# Patient Record
Sex: Female | Born: 1955
Health system: Southern US, Community
[De-identification: ages and names within clinical notes are randomized; demographics above are authoritative.]

## PROBLEM LIST (undated history)

## (undated) DIAGNOSIS — M545 Low back pain: Secondary | ICD-10-CM

## (undated) DIAGNOSIS — Z950 Presence of cardiac pacemaker: Secondary | ICD-10-CM

## (undated) DIAGNOSIS — M1990 Unspecified osteoarthritis, unspecified site: Secondary | ICD-10-CM

## (undated) DIAGNOSIS — C9000 Multiple myeloma not having achieved remission: Secondary | ICD-10-CM

## (undated) DIAGNOSIS — E785 Hyperlipidemia, unspecified: Secondary | ICD-10-CM

## (undated) DIAGNOSIS — I509 Heart failure, unspecified: Secondary | ICD-10-CM

## (undated) DIAGNOSIS — F329 Major depressive disorder, single episode, unspecified: Secondary | ICD-10-CM

## (undated) DIAGNOSIS — I10 Essential (primary) hypertension: Secondary | ICD-10-CM

## (undated) DIAGNOSIS — G4733 Obstructive sleep apnea (adult) (pediatric): Secondary | ICD-10-CM

## (undated) DIAGNOSIS — I639 Cerebral infarction, unspecified: Secondary | ICD-10-CM

## (undated) DIAGNOSIS — N390 Urinary tract infection, site not specified: Secondary | ICD-10-CM

## (undated) DIAGNOSIS — G459 Transient cerebral ischemic attack, unspecified: Secondary | ICD-10-CM

## (undated) DIAGNOSIS — D801 Nonfamilial hypogammaglobulinemia: Secondary | ICD-10-CM

## (undated) DIAGNOSIS — F32A Depression, unspecified: Secondary | ICD-10-CM

## (undated) DIAGNOSIS — R778 Other specified abnormalities of plasma proteins: Secondary | ICD-10-CM

## (undated) DIAGNOSIS — R1013 Epigastric pain: Secondary | ICD-10-CM

## (undated) DIAGNOSIS — M549 Dorsalgia, unspecified: Secondary | ICD-10-CM

## (undated) DIAGNOSIS — K56609 Unspecified intestinal obstruction, unspecified as to partial versus complete obstruction: Secondary | ICD-10-CM

## (undated) DIAGNOSIS — R700 Elevated erythrocyte sedimentation rate: Secondary | ICD-10-CM

## (undated) DIAGNOSIS — D472 Monoclonal gammopathy: Secondary | ICD-10-CM

## (undated) DIAGNOSIS — I519 Heart disease, unspecified: Secondary | ICD-10-CM

## (undated) DIAGNOSIS — M791 Myalgia, unspecified site: Secondary | ICD-10-CM

## (undated) DIAGNOSIS — R0602 Shortness of breath: Secondary | ICD-10-CM

## (undated) DIAGNOSIS — A0472 Enterocolitis due to Clostridium difficile, not specified as recurrent: Secondary | ICD-10-CM

## (undated) DIAGNOSIS — K529 Noninfective gastroenteritis and colitis, unspecified: Secondary | ICD-10-CM

## (undated) DIAGNOSIS — K219 Gastro-esophageal reflux disease without esophagitis: Secondary | ICD-10-CM

## (undated) HISTORY — DX: Other specified abnormalities of plasma proteins: R77.8

## (undated) HISTORY — DX: Low back pain: M54.5

## (undated) HISTORY — DX: Essential (primary) hypertension: I10

## (undated) HISTORY — PX: CHOLECYSTECTOMY: SHX55

## (undated) HISTORY — DX: Gastro-esophageal reflux disease without esophagitis: K21.9

## (undated) HISTORY — DX: Major depressive disorder, single episode, unspecified: F32.9

## (undated) HISTORY — DX: Obstructive sleep apnea (adult) (pediatric): G47.33

## (undated) HISTORY — DX: Myalgia, unspecified site: M79.10

## (undated) HISTORY — DX: Multiple myeloma not having achieved remission: C90.00

## (undated) HISTORY — DX: Heart failure, unspecified: I50.9

## (undated) HISTORY — DX: Shortness of breath: R06.02

## (undated) HISTORY — DX: Dorsalgia, unspecified: M54.9

## (undated) HISTORY — DX: Elevated erythrocyte sedimentation rate: R70.0

## (undated) HISTORY — DX: Cerebral infarction, unspecified: I63.9

## (undated) HISTORY — DX: Nonfamilial hypogammaglobulinemia: D80.1

## (undated) HISTORY — DX: Urinary tract infection, site not specified: N39.0

## (undated) HISTORY — DX: Depression, unspecified: F32.A

## (undated) HISTORY — PX: TUBAL LIGATION: SHX77

## (undated) HISTORY — PX: TONSILLECTOMY: SUR1361

## (undated) HISTORY — PX: KNEE SURGERY: SHX244

## (undated) HISTORY — DX: Unspecified osteoarthritis, unspecified site: M19.90

## (undated) HISTORY — PX: ABDOMINAL HYSTERECTOMY: SHX81

## (undated) HISTORY — DX: Hyperlipidemia, unspecified: E78.5

## (undated) HISTORY — DX: Noninfective gastroenteritis and colitis, unspecified: K52.9

## (undated) HISTORY — DX: Heart disease, unspecified: I51.9

## (undated) HISTORY — DX: Epigastric pain: R10.13

## (undated) HISTORY — DX: Monoclonal gammopathy: D47.2

## (undated) HISTORY — DX: Unspecified intestinal obstruction, unspecified as to partial versus complete obstruction: K56.609

---

## 2005-01-02 ENCOUNTER — Ambulatory Visit (HOSPITAL_COMMUNITY): Admission: RE | Admit: 2005-01-02 | Discharge: 2005-01-02 | Payer: Self-pay | Admitting: Family Medicine

## 2005-03-11 ENCOUNTER — Emergency Department (HOSPITAL_COMMUNITY): Admission: EM | Admit: 2005-03-11 | Discharge: 2005-03-11 | Payer: Self-pay | Admitting: Emergency Medicine

## 2005-07-03 ENCOUNTER — Ambulatory Visit (HOSPITAL_COMMUNITY): Admission: RE | Admit: 2005-07-03 | Discharge: 2005-07-03 | Payer: Self-pay | Admitting: Family Medicine

## 2005-07-05 ENCOUNTER — Emergency Department (HOSPITAL_COMMUNITY): Admission: EM | Admit: 2005-07-05 | Discharge: 2005-07-05 | Payer: Self-pay | Admitting: Emergency Medicine

## 2005-07-09 ENCOUNTER — Ambulatory Visit (HOSPITAL_COMMUNITY): Admission: RE | Admit: 2005-07-09 | Discharge: 2005-07-10 | Payer: Self-pay | Admitting: Specialist

## 2006-03-29 ENCOUNTER — Encounter: Admission: RE | Admit: 2006-03-29 | Discharge: 2006-03-29 | Payer: Self-pay | Admitting: Gastroenterology

## 2006-09-09 ENCOUNTER — Ambulatory Visit (HOSPITAL_COMMUNITY): Admission: RE | Admit: 2006-09-09 | Discharge: 2006-09-10 | Payer: Self-pay | Admitting: General Surgery

## 2006-09-09 ENCOUNTER — Encounter (INDEPENDENT_AMBULATORY_CARE_PROVIDER_SITE_OTHER): Payer: Self-pay | Admitting: Specialist

## 2009-10-25 LAB — HM COLONOSCOPY

## 2010-06-17 ENCOUNTER — Emergency Department (HOSPITAL_BASED_OUTPATIENT_CLINIC_OR_DEPARTMENT_OTHER): Admission: EM | Admit: 2010-06-17 | Discharge: 2010-06-17 | Payer: Self-pay | Admitting: Emergency Medicine

## 2010-11-28 ENCOUNTER — Emergency Department (HOSPITAL_BASED_OUTPATIENT_CLINIC_OR_DEPARTMENT_OTHER)
Admission: EM | Admit: 2010-11-28 | Discharge: 2010-11-29 | Payer: Self-pay | Source: Home / Self Care | Admitting: Emergency Medicine

## 2010-12-01 LAB — CBC
HCT: 37.9 % (ref 36.0–46.0)
Hemoglobin: 12.9 g/dL (ref 12.0–15.0)
MCH: 29 pg (ref 26.0–34.0)
MCHC: 34 g/dL (ref 30.0–36.0)
MCV: 85.2 fL (ref 78.0–100.0)
Platelets: 185 K/uL (ref 150–400)
RBC: 4.45 MIL/uL (ref 3.87–5.11)
RDW: 12.8 % (ref 11.5–15.5)
WBC: 4.6 K/uL (ref 4.0–10.5)

## 2010-12-01 LAB — COMPREHENSIVE METABOLIC PANEL
CO2: 26 mEq/L (ref 19–32)
Calcium: 9.4 mg/dL (ref 8.4–10.5)
Creatinine, Ser: 0.6 mg/dL (ref 0.4–1.2)
GFR calc non Af Amer: >60 mL/min (ref >60)
Glucose, Bld: 139 mg/dL — ABNORMAL HIGH (ref 70–99)

## 2010-12-01 LAB — COMPREHENSIVE METABOLIC PANEL WITH GFR
ALT: 16 U/L (ref 0–35)
AST: 20 U/L (ref 0–37)
Albumin: 4.1 g/dL (ref 3.5–5.2)
Alkaline Phosphatase: 62 U/L (ref 39–117)
BUN: 17 mg/dL (ref 6–23)
Chloride: 109 meq/L (ref 96–112)
GFR calc Af Amer: >60 mL/min (ref >60)
Potassium: 3.8 meq/L (ref 3.5–5.1)
Sodium: 148 meq/L — ABNORMAL HIGH (ref 135–145)
Total Bilirubin: 0.6 mg/dL (ref 0.3–1.2)
Total Protein: 8 g/dL (ref 6.0–8.3)

## 2010-12-02 LAB — POCT CARDIAC MARKERS
CKMB, poc: 2.5 ng/mL (ref 1.0–8.0)
Myoglobin, poc: 59.9 ng/mL (ref 12–200)
Troponin i, poc: <0.05 ng/mL (ref 0.00–0.09)

## 2010-12-13 ENCOUNTER — Emergency Department (HOSPITAL_BASED_OUTPATIENT_CLINIC_OR_DEPARTMENT_OTHER)
Admission: EM | Admit: 2010-12-13 | Discharge: 2010-12-13 | Disposition: A | Discharge status: Home / Self Care | Payer: BC Managed Care – PPO | Attending: Emergency Medicine | Admitting: Emergency Medicine

## 2010-12-13 ENCOUNTER — Emergency Department (INDEPENDENT_AMBULATORY_CARE_PROVIDER_SITE_OTHER): Payer: BC Managed Care – PPO

## 2010-12-13 DIAGNOSIS — M25569 Pain in unspecified knee: Secondary | ICD-10-CM

## 2010-12-13 DIAGNOSIS — M25469 Effusion, unspecified knee: Secondary | ICD-10-CM

## 2010-12-13 DIAGNOSIS — I10 Essential (primary) hypertension: Secondary | ICD-10-CM | POA: Insufficient documentation

## 2010-12-13 DIAGNOSIS — Z79899 Other long term (current) drug therapy: Secondary | ICD-10-CM | POA: Insufficient documentation

## 2010-12-13 DIAGNOSIS — W19XXXA Unspecified fall, initial encounter: Secondary | ICD-10-CM

## 2011-01-23 LAB — CBC
HCT: 37.7 % (ref 36.0–46.0)
Hemoglobin: 12.4 g/dL (ref 12.0–15.0)
MCH: 29.7 pg (ref 26.0–34.0)
MCHC: 33 g/dL (ref 30.0–36.0)
MCV: 90 fL (ref 78.0–100.0)
Platelets: 153 10*3/uL (ref 150–400)
RBC: 4.19 MIL/uL (ref 3.87–5.11)
RDW: 12.4 % (ref 11.5–15.5)
WBC: 5 10*3/uL (ref 4.0–10.5)

## 2011-01-23 LAB — POCT CARDIAC MARKERS
CKMB, poc: 1.3 ng/mL (ref 1.0–8.0)
Myoglobin, poc: 61.1 ng/mL (ref 12–200)
Troponin i, poc: <0.05 ng/mL (ref 0.00–0.09)

## 2011-01-23 LAB — BASIC METABOLIC PANEL WITH GFR
CO2: 29 meq/L (ref 19–32)
Chloride: 107 meq/L (ref 96–112)
GFR calc Af Amer: >60 mL/min (ref >60)
Glucose, Bld: 93 mg/dL (ref 70–99)
Potassium: 3.7 meq/L (ref 3.5–5.1)
Sodium: 144 meq/L (ref 135–145)

## 2011-01-23 LAB — DIFFERENTIAL
Basophils Absolute: 0.1 10*3/uL (ref 0.0–0.1)
Basophils Relative: 1 % (ref 0–1)
Eosinophils Absolute: 0.1 K/uL (ref 0.0–0.7)
Eosinophils Relative: 3 % (ref 0–5)
Lymphocytes Relative: 38 % (ref 12–46)
Lymphs Abs: 1.9 K/uL (ref 0.7–4.0)
Monocytes Absolute: 0.4 K/uL (ref 0.1–1.0)
Monocytes Relative: 8 % (ref 3–12)
Neutro Abs: 2.5 K/uL (ref 1.7–7.7)
Neutrophils Relative %: 50 % (ref 43–77)

## 2011-01-23 LAB — BASIC METABOLIC PANEL
BUN: 24 mg/dL — ABNORMAL HIGH (ref 6–23)
Calcium: 9.2 mg/dL (ref 8.4–10.5)
Creatinine, Ser: 0.7 mg/dL (ref 0.4–1.2)
GFR calc non Af Amer: >60 mL/min (ref >60)

## 2011-03-27 NOTE — Op Note (Signed)
Tami [name], Tami [name]               ACCOUNT NO.:  [id number]   MEDICAL RECORD NO.:  [id number]          PATIENT TYPE:  OIB   LOCATION:  5703                         FACILITY:  [facility]   PHYSICIAN:  Cherylynn Ridges, M.D.    DATE OF BIRTH:  [birth year]   DATE OF PROCEDURE:  09/10/2006  DATE OF DISCHARGE:                                 OPERATIVE REPORT   PREOPERATIVE DIAGNOSIS:  Biliary dyskinesia with symptoms.   POSTOPERATIVE DIAGNOSIS:  Biliary dyskinesia with symptoms with abdominal  adhesions.   PROCEDURE:  1. Laparoscopic cholecystectomy with cholangiogram.  2. Laparoscopic enterolysis.   SURGEON:  Cherylynn Ridges, M.D.   ASSISTANT:  Anselm Pancoast. Zachery Dakins, M.D.   ANESTHESIA:  General endotracheal.   ESTIMATED BLOOD LOSS:  Less than 30 mL.   COMPLICATIONS:  None.   CONDITION:  Stable.   FINDINGS:  The patient had significant abdominal adhesions in the  periumbilical area and in the lower abdomen from her previous hysterectomy.  Her gallbladder cholangiogram showed good flow to the duodenum.  No filling  defects.  No ductal dilatation.  Good proximal flow and what appeared to be  an accessory right hepatic duct coming off of the proximal cystic duct.   INDICATIONS FOR OPERATION:  The patient is a 55 year old with symptomatic  biliary dyskinesia who comes in for an elective laparoscopic  cholecystectomy.   OPERATION:  The patient was taken to the operating room and placed on the  table in the supine position.  After an adequate endotracheal anesthetic was  administered, she was prepped and draped in the usual sterile manner  exposing the midline in the right upper quadrant.   A supraumbilical curvilinear incision was made using an 11 blade and taken  down to the midline.  It was at that level that we palpated the small  umbilical hernia which we used as our access point for the perineal cavity.  We used Kelly clamps to bluntly dissect down to the peritoneal cavity and we  were able to easily do so.  We then passed a pursestring suture of 0 Vicryl  on a UR-16 around the fascial at the hernia and then passed the Hasson  cannula into the peritoneal cavity.  It easily went in and we were able to  insufflate carbon dioxide gas up to a maximum pressure of 15 mmHg.  We were  then able to pass a laparoscope easily into the peritoneal cavity; however,  it showed that the patient had a very short torso and that her liver edge  almost extended down to the anterior-superior iliac spine.   The level of the 5-mm cannula on the right side and the xiphoid cannula was  significantly lower than usual with the lateral-most 5-mm cannula being  almost directly above the anterior-superior iliac spine.  All three cannulas  were placed under direct vision into the peritoneal cavity.  Once they were  in place, we spent a significant amount of time taking down some of the  periumbilical and lower abdominal wall adhesions of omentum, which  significantly impeded the freedom of movement of  the midline trocar.  Once  this was done, we placed the patient in reverse Trendelenburg, placed the  left side down and then used graspers to adequately retract the gallbladder  for further dissection.   The patient was noted to have a very long external or anterior cystic artery  which we were able to localize and then clip proximally x3 and transect.  We  then dissected out the peritoneum, mobilizing the triangle of Calot and  hepatic duodenal triangle and isolated the cystic duct with an adequate  window surrounding.  Once this was done, we placed a clip along the  gallbladder side and had to make a cholecystocolotomy through which a  cholangiogram catheter, Cook type, was passed and then clipped in place.  With the cholangiograms in place, we were able to do a cholangiogram which  showed good flow into the duodenum and accessory right hepatic duct coming  off the cystic duct distal to where  the catheter was inserted.  There was  good proximal flow also.   Once this was done and there was no evidence of any intraductal stones,  removed the catheter and then clipped it proximal to the point of the  accessory duct takeoff.  We then dissected out the gallbladder from its bed  after transecting the cystic duct with minimal difficulty and brought it out  from the supraumbilical fascial site with minimal difficulty.  We had to  place an additional stitch in addition to the pursestring at the umbilical  site to close off the fascia.   Once this was done we inspected the bed where there was evidence of no  bleeding.  We aspirated fluid and gas from around the subhepatic space, the  subdiaphragmatic space on the right side.  Once we had aspirated out all  gas, we removed all cannulas.  We used 0.25% Marcaine with epinephrine at  all sites.  We closed the supraumbilical and subxiphoid incisions using  running subcuticular stitch of 4-0 Vicryl.  We placed Dermabond to close the  lateral most cannula sites.  Sterile dressings were applied.  All needle  counts, sponge counts, and instrument counts were correct.      Cherylynn Ridges, M.D.  Electronically Signed     JOW/MEDQ  D:  09/09/2006  T:  09/10/2006  Job:  [id number]

## 2011-03-27 NOTE — Discharge Summary (Signed)
Tami [name], Tami [name]               ACCOUNT NO.:  [id number]   MEDICAL RECORD NO.:  [id number]          PATIENT TYPE:  OIB   LOCATION:  5511                         FACILITY:  [facility]   PHYSICIAN:  Sanjeev K. Deveshwar, M.D.DATE OF BIRTH:  02-03-1956   DATE OF ADMISSION:  07/09/2005  DATE OF DISCHARGE:                                 DISCHARGE SUMMARY   This was a 23-hour observation.   CHIEF COMPLAINT:  The patient presents for cerebral angiogram.   HISTORY OF PRESENT ILLNESS:  This is a 55 year old female followed by Dr.  Laurann Montana and evaluated by Dr. Neale Burly for headaches. 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 The patient was referred to Dr. Corliss Skains  for cerebral angiogram to rule out an aneurysm and further evaluate her  headaches.   PAST MEDICAL HISTORY:  1.  Hypertension.  2.  Gastroesophageal reflux disease.  3.  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   PAST MEDICAL HISTORY:  She is status post hysterectomy and right knee  surgery.   ALLERGIES:  The patient is intolerant to CODEINE.   MEDICATIONS ON ADMISSION:  1.  Phenergan 25 mg p.r.n.  2.  Lotrel 10/20 one q.a.m.  3.  Flexeril 10 mg t.i.d. p.r.n.  4.  Atenolol 50 mg daily.  5.  Topamax 25 mg 3 tablets at bedtime.  6.  Prednisone as directed.   SOCIAL HISTORY:  The patient is single. She has three children. She lives  alone in [place]. She has never used tobacco. She does not use alcohol.  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   FAMILY HISTORY:  The patient's mother is alive at age 61. She has  hypertension and diabetes. Her father is alive at age 60. He has a history  of a gastric ulcer.   HOSPITAL COURSE:  As noted, this patient was referred for a cerebral  angiogram. The cerebral angiogram was performed on the day of admission.  This showed no evidence to suggest an aneurysm or an AV malformation. 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 Dr. Corliss Skains did not feel any further evaluation was  indicated at this time.   The patient was to be discharged that evening. However, she had a headache,  and decision was made to keep her in the hospital for a 23-hour observation.  She was discharged the following morning in improved and stable condition.   LABORATORY DATA:  A chemistry profile on the day of discharge revealed BUN  23, creatinine 0.8, potassium 3.6, glucose 105. The remainder was within  normal limits. A CBC was within normal limits. A urinalysis was performed  that showed 3 to 6 WBCs per high powered field, 3 to 6 RBCs per high powered  field, rare bacteria. There was a large amount of blood. Nitrite was  negative. Leukocytes revealed a small amount.   DISCHARGE INSTRUCTIONS:  The patient was told to her limit her activity for  the  next two days. She was told she could return to work next Wednesday as  scheduled. She was not to lift more than 10 pounds until she returned to  work. She was not to take any tub baths for a week. She was told to  gradually increase her activity. She was told to take Tylenol as needed for  pain and continue her regular home medications as listed above.   The patient was told to followup with Dr. Laurann Montana regarding the blood  in her urine. She was told to call Dr. Cliffton Asters for an appointment. She was  told to call Dr. Corliss Skains if she had any problems with her groin area.   PROBLEM LIST AT THE TIME OF DISCHARGE:  1.  History of headaches.  2.  Recent MRA with a question of a cerebral aneurysm.  3.  Status post cerebral angiogram showing no aneurysm or arteriovenous      malformation.  4.  History of hypertension.  5.  History of gastroesophageal reflux disease.  6.  Arthritis.  7.  Questionable history of anxiety and depression.  8.  Hematuria. Further followup recommended.       [name], P.A.    ______________________________  Grandville Silos. Corliss Skains, M.D.    DR/MEDQ  D:  07/10/2005  T:  07/10/2005  Job:  [id number]   cc:   Stacie Acres. Cliffton Asters, M.D.  Fax: [phone]   Santiago Glad  [address], [address]  [place]  [place] [address]  Fax: [phone]

## 2011-03-27 NOTE — H&P (Signed)
Tami [name], Tami [name]               ACCOUNT NO.:  [id number]   MEDICAL RECORD NO.:  [id number]          PATIENT TYPE:  OIB   LOCATION:                               FACILITY:  [facility]   PHYSICIAN:  Sanjeev K. Deveshwar, M.D.DATE OF BIRTH:  08/30/1956   DATE OF ADMISSION:  DATE OF DISCHARGE:                                HISTORY & PHYSICAL   CHIEF COMPLAINT:  The patient is here for cerebral angiogram today.   HISTORY OF PRESENT ILLNESS:  This is a 55 year old female followed by Dr.  Laurann Montana, recently referred to Dr. Neale Burly for evaluation of headaches.  The patient apparently has had headaches since the 1970s. The headaches have  become worse over the past three weeks. 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 There was also a question of significant stenosis of the left  anterior cerebral artery proximally. The patient has been referred for a  cerebral angiogram and presents today for that study.   PAST MEDICAL HISTORY:  1.  Hypertension.  2.  Gastroesophageal reflux disease.  3.  Arthritis in her knees.   She denies diabetes. She denies any type of cardiac problems.   PAST SURGICAL HISTORY:  Significant for hysterectomy and right knee surgery.   ALLERGIES:  CODEINE causes nausea and vomiting. She does not know whether  she has ever had contrast dye.   CURRENT MEDICATIONS:  1.  Phenergan 25 mg 1 q.4h. p.r.n.  2.  Lotrel 10/20 one q.a.m.  3.  Flexeril 10 mg t.i.d. p.r.n.  4.  Atenolol 50 mg daily.  5.  Topamax 25 mg 3 at bedtime.  6.  Prednisone to be taken as directed. The patient tells me she is taking      10 mg 4 times daily.   SOCIAL HISTORY:  The patient is single. She has three children. She lives  alone in [place]. She has never used tobacco. She does not use alcohol.  She works as a Pension scheme manager at [employer].   FAMILY HISTORY:  The patient's mother is living at age 14.  She has  hypertension and diabetes. Her father is alive at age 93. He has a history  of gastric cancer.   REVIEW OF SYSTEMS:  Positive for recent headaches as mentioned. She also  reports some blurred vision. She has had episodes where she feels hot and  diaphoretic. However, her temperature is normal during these times by her  report. She has had a recent nosebleed which she attributes to sinus  trouble. She has a history of anxiety and depression. She has arthritis in  her knees. She has had intermittent numbness of both lower extremities which  occurs mainly at night. She sometimes has difficulty walking and difficulty  with her coordination.   PHYSICAL EXAMINATION:  GENERAL:  Reveals a 55 year old African-American  female with a flat affect but no acute distress. Vital signs are pending.  Airway is rated at a 1 or a 2. Her ASA scale is a 1 or a 2.  HEENT:  Unremarkable. The patient has exophthalmus.  NECK:  Reveals no bruits. No jugular venous distention.  HEART:  Reveals regular rate and rhythm with a question of a soft systolic  murmur.  LUNGS:  Clear.  ABDOMEN:  Soft, nontender.  EXTREMITIES:  Reveal pulses to be intact without edema.  SKIN:  Warm and dry.  EXTREMITIES:  Mental status:  The patient is alert and oriented. She follows  commands. Cranial nerves II-XII are grossly intact. Sensation is intact to  light touch. Motor strength is 5/5 throughout. Cerebellar testing is intact  although performed somewhat slowly.   IMPRESSION:  1.  Recent headaches.  2.  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  3.  History of hypertension.  4.  Gastroesophageal reflux disease.  5.  History of arthritis.  6.  History of anxiety and depression.   PLAN:  The patient will undergo cerebral angiograms today to further  evaluate the abnormal MRA findings as well as her  headaches.      [name], P.A.    ______________________________  Grandville Silos. Corliss Skains, M.D.    DR/MEDQ  D:  07/09/2005  T:  07/09/2005  Job:  [id number]   cc:   Stacie Acres. Cliffton Asters, M.D.  Fax: [phone]   Santiago Glad  [address]  [place]  [place] [address]  Fax: [phone]

## 2014-08-09 HISTORY — PX: PACEMAKER INSERTION: SHX728

## 2014-11-09 DIAGNOSIS — K56609 Unspecified intestinal obstruction, unspecified as to partial versus complete obstruction: Secondary | ICD-10-CM

## 2014-11-09 HISTORY — DX: Unspecified intestinal obstruction, unspecified as to partial versus complete obstruction: K56.609

## 2015-02-21 ENCOUNTER — Encounter: Payer: Self-pay | Admitting: Family Medicine

## 2015-02-21 ENCOUNTER — Ambulatory Visit (INDEPENDENT_AMBULATORY_CARE_PROVIDER_SITE_OTHER): Payer: Managed Care, Other (non HMO) | Admitting: Family Medicine

## 2015-02-21 VITALS — BP 162/108 | HR 91 | Temp 97.7°F | Resp 18 | Ht 66.0 in | Wt 161.0 lb

## 2015-02-21 DIAGNOSIS — F329 Major depressive disorder, single episode, unspecified: Secondary | ICD-10-CM

## 2015-02-21 DIAGNOSIS — K5660 Unspecified intestinal obstruction: Secondary | ICD-10-CM

## 2015-02-21 DIAGNOSIS — I509 Heart failure, unspecified: Secondary | ICD-10-CM

## 2015-02-21 DIAGNOSIS — F32A Depression, unspecified: Secondary | ICD-10-CM

## 2015-02-21 DIAGNOSIS — R1013 Epigastric pain: Secondary | ICD-10-CM

## 2015-02-21 DIAGNOSIS — K219 Gastro-esophageal reflux disease without esophagitis: Secondary | ICD-10-CM | POA: Diagnosis not present

## 2015-02-21 DIAGNOSIS — I10 Essential (primary) hypertension: Secondary | ICD-10-CM

## 2015-02-21 DIAGNOSIS — E785 Hyperlipidemia, unspecified: Secondary | ICD-10-CM

## 2015-02-21 LAB — CBC
HEMATOCRIT: 36.7 % (ref 36.0–46.0)
HEMOGLOBIN: 12.3 g/dL (ref 12.0–15.0)
MCHC: 33.4 g/dL (ref 30.0–36.0)
MCV: 88 fl (ref 78.0–100.0)
PLATELETS: 166 10*3/uL (ref 150.0–400.0)
RBC: 4.17 Mil/uL (ref 3.87–5.11)
RDW: 14.2 % (ref 11.5–15.5)
WBC: 4 10*3/uL (ref 4.0–10.5)

## 2015-02-21 LAB — H. PYLORI ANTIBODY, IGG: H Pylori IgG: NEGATIVE

## 2015-02-21 LAB — COMPREHENSIVE METABOLIC PANEL
ALK PHOS: 44 U/L (ref 39–117)
ALT: 16 U/L (ref 0–35)
AST: 18 U/L (ref 0–37)
Albumin: 3.8 g/dL (ref 3.5–5.2)
BILIRUBIN TOTAL: 0.6 mg/dL (ref 0.2–1.2)
BUN: 19 mg/dL (ref 6–23)
CO2: 27 mEq/L (ref 19–32)
Calcium: 9.5 mg/dL (ref 8.4–10.5)
Chloride: 106 mEq/L (ref 96–112)
Creatinine, Ser: 0.67 mg/dL (ref 0.40–1.20)
GFR: 115.18 mL/min (ref >60.00)
Glucose, Bld: 81 mg/dL (ref 70–99)
Potassium: 3.4 mEq/L — ABNORMAL LOW (ref 3.5–5.1)
Sodium: 140 mEq/L (ref 135–145)
TOTAL PROTEIN: 8.1 g/dL (ref 6.0–8.3)

## 2015-02-21 LAB — TSH: TSH: 0.77 u[IU]/mL (ref 0.35–4.50)

## 2015-02-21 MED ORDER — ESCITALOPRAM OXALATE 20 MG PO TABS: 20.0000 mg | ORAL_TABLET | Freq: Every day | ORAL | Status: DC

## 2015-02-21 MED ORDER — CARVEDILOL 12.5 MG PO TABS: 12.5000 mg | ORAL_TABLET | Freq: Two times a day (BID) | ORAL | Status: DC

## 2015-02-21 NOTE — Patient Instructions (Addendum)
[facility] on [place] previous PMD  Rel of [facility] Dr Ola Spurr cardiology, works with [place] [facility] at [employer], on [place] of [facility] Earnie Larsson GYN [facility] in [place]   Consider a probiotic daily such as Digestive Advantage or [place] for Adults A healthy lifestyle and preventive care can promote health and wellness. Preventive health guidelines for women include the following key practices.  A routine yearly physical is a good way to check with your health care provider about your health and preventive screening. It is a chance to share any concerns and updates on your health and to receive a thorough exam.  Visit your dentist for a routine exam and preventive care every 6 months. Brush your teeth twice a day and floss once a day. Good oral hygiene prevents tooth decay and gum disease.  The frequency of eye exams is based on your age, health, family medical history, use of contact lenses, and other factors. Follow your health care provider's recommendations for frequency of eye exams.  Eat a healthy diet. Foods like vegetables, fruits, whole grains, low-fat dairy products, and lean protein foods contain the nutrients you need without too many calories. Decrease your intake of foods high in solid fats, added sugars, and salt. Eat the right amount of calories for you.Get information about a proper diet from your health care provider, if necessary.  Regular physical exercise is one of the most important things you can do for your health. Most adults should get at least 150 minutes of moderate-intensity exercise (any activity that increases your heart rate and causes you to sweat) each week. In addition, most adults need muscle-strengthening exercises on 2 or more days a week.  Maintain a healthy weight. The body mass index (BMI) is a screening tool to identify possible weight problems. It provides an  estimate of body fat based on height and weight. Your health care provider can find your BMI and can help you achieve or maintain a healthy weight.For adults 20 years and older:  A BMI below 18.5 is considered underweight.  A BMI of 18.5 to 24.9 is normal.  A BMI of 25 to 29.9 is considered overweight.  A BMI of 30 and above is considered obese.  Maintain normal blood lipids and cholesterol levels by exercising and minimizing your intake of saturated fat. Eat a balanced diet with plenty of fruit and vegetables. Blood tests for lipids and cholesterol should begin at age 47 and be repeated every 5 years. If your lipid or cholesterol levels are high, you are over 50, or you are at high risk for heart disease, you may need your cholesterol levels checked more frequently.Ongoing high lipid and cholesterol levels should be treated with medicines if diet and exercise are not working.  If you smoke, find out from your health care provider how to quit. If you do not use tobacco, do not start.  Lung cancer screening is recommended for adults aged 31-80 years who are at high risk for developing lung cancer because of a history of smoking. A yearly low-dose CT scan of the lungs is recommended for people who have at least a 30-pack-year history of smoking and are a current smoker or have quit within the past 15 years. A pack year of smoking is smoking an average of 1 pack of cigarettes a day for 1 year (for example: 1 pack a day  for 30 years or 2 packs a day for 15 years). Yearly screening should continue until the smoker has stopped smoking for at least 15 years. Yearly screening should be stopped for people who develop a health problem that would prevent them from having lung cancer treatment.  If you are pregnant, do not drink alcohol. If you are breastfeeding, be very cautious about drinking alcohol. If you are not pregnant and choose to drink alcohol, do not have more than 1 drink per day. One drink is  considered to be 12 ounces (355 mL) of beer, 5 ounces (148 mL) of wine, or 1.5 ounces (44 mL) of liquor.  Avoid use of street drugs. Do not share needles with anyone. Ask for help if you need support or instructions about stopping the use of drugs.  High blood pressure causes heart disease and increases the risk of stroke. Your blood pressure should be checked at least every 1 to 2 years. Ongoing high blood pressure should be treated with medicines if weight loss and exercise do not work.  If you are 22-60 years old, ask your health care provider if you should take aspirin to prevent strokes.  Diabetes screening involves taking a blood sample to check your fasting blood sugar level. This should be done once every 3 years, after age 72, if you are within normal weight and without risk factors for diabetes. Testing should be considered at a younger age or be carried out more frequently if you are overweight and have at least 1 risk factor for diabetes.  Breast cancer screening is essential preventive care for women. You should practice "breast self-awareness." This means understanding the normal appearance and feel of your breasts and may include breast self-examination. Any changes detected, no matter how small, should be reported to a health care provider. Women in their 6s and 30s should have a clinical breast exam (CBE) by a health care provider as part of a regular health exam every 1 to 3 years. After age 39, women should have a CBE every year. Starting at age 59, women should consider having a mammogram (breast X-ray test) every year. Women who have a family history of breast cancer should talk to their health care provider about genetic screening. Women at a high risk of breast cancer should talk to their health care providers about having an MRI and a mammogram every year.  Breast cancer gene (BRCA)-related cancer risk assessment is recommended for women who have family members with BRCA-related  cancers. BRCA-related cancers include breast, ovarian, tubal, and peritoneal cancers. Having family members with these cancers may be associated with an increased risk for harmful changes (mutations) in the breast cancer genes BRCA1 and BRCA2. Results of the assessment will determine the need for genetic counseling and BRCA1 and BRCA2 testing.  Routine pelvic exams to screen for cancer are no longer recommended for nonpregnant women who are considered low risk for cancer of the pelvic organs (ovaries, uterus, and vagina) and who do not have symptoms. Ask your health care provider if a screening pelvic exam is right for you.  If you have had past treatment for cervical cancer or a condition that could lead to cancer, you need Pap tests and screening for cancer for at least 20 years after your treatment. If Pap tests have been discontinued, your risk factors (such as having a new sexual partner) need to be reassessed to determine if screening should be resumed. Some women have medical problems that increase the chance  of getting cervical cancer. In these cases, your health care provider may recommend more frequent screening and Pap tests.  The HPV test is an additional test that may be used for cervical cancer screening. The HPV test looks for the virus that can cause the cell changes on the cervix. The cells collected during the Pap test can be tested for HPV. The HPV test could be used to screen women aged 63 years and older, and should be used in women of any age who have unclear Pap test results. After the age of 38, women should have HPV testing at the same frequency as a Pap test.  Colorectal cancer can be detected and often prevented. Most routine colorectal cancer screening begins at the age of 17 years and continues through age 34 years. However, your health care provider may recommend screening at an earlier age if you have risk factors for colon cancer. On a yearly basis, your health care provider  may provide home test kits to check for hidden blood in the stool. Use of a small camera at the end of a tube, to directly examine the colon (sigmoidoscopy or colonoscopy), can detect the earliest forms of colorectal cancer. Talk to your health care provider about this at age 58, when routine screening begins. Direct exam of the colon should be repeated every 5-10 years through age 68 years, unless early forms of pre-cancerous polyps or small growths are found.  People who are at an increased risk for hepatitis B should be screened for this virus. You are considered at high risk for hepatitis B if:  You were born in a country where hepatitis B occurs often. Talk with your health care provider about which countries are considered high risk.  Your parents were born in a high-risk country and you have not received a shot to protect against hepatitis B (hepatitis B vaccine).  You have HIV or AIDS.  You use needles to inject street drugs.  You live with, or have sex with, someone who has hepatitis B.  You get hemodialysis treatment.  You take certain medicines for conditions like cancer, organ transplantation, and autoimmune conditions.  Hepatitis C blood testing is recommended for all people born from [birth year] through 1965 and any individual with known risks for hepatitis C.  Practice safe sex. Use condoms and avoid high-risk sexual practices to reduce the spread of sexually transmitted infections (STIs). STIs include gonorrhea, chlamydia, syphilis, trichomonas, herpes, HPV, and human immunodeficiency virus (HIV). Herpes, HIV, and HPV are viral illnesses that have no cure. They can result in disability, cancer, and death.  You should be screened for sexually transmitted illnesses (STIs) including gonorrhea and chlamydia if:  You are sexually active and are younger than 24 years.  You are older than 24 years and your health care provider tells you that you are at risk for this type of  infection.  Your sexual activity has changed since you were last screened and you are at an increased risk for chlamydia or gonorrhea. Ask your health care provider if you are at risk.  If you are at risk of being infected with HIV, it is recommended that you take a prescription medicine daily to prevent HIV infection. This is called preexposure prophylaxis (PrEP). You are considered at risk if:  You are a heterosexual woman, are sexually active, and are at increased risk for HIV infection.  You take drugs by injection.  You are sexually active with a partner who has HIV.  Talk with your health care provider about whether you are at high risk of being infected with HIV. If you choose to begin PrEP, you should first be tested for HIV. You should then be tested every 3 months for as long as you are taking PrEP.  Osteoporosis is a disease in which the bones lose minerals and strength with aging. This can result in serious bone fractures or breaks. The risk of osteoporosis can be identified using a bone density scan. Women ages 61 years and over and women at risk for fractures or osteoporosis should discuss screening with their health care providers. Ask your health care provider whether you should take a calcium supplement or vitamin D to reduce the rate of osteoporosis.  Menopause can be associated with physical symptoms and risks. Hormone replacement therapy is available to decrease symptoms and risks. You should talk to your health care provider about whether hormone replacement therapy is right for you.  Use sunscreen. Apply sunscreen liberally and repeatedly throughout the day. You should seek shade when your shadow is shorter than you. Protect yourself by wearing long sleeves, pants, a wide-brimmed hat, and sunglasses year round, whenever you are outdoors.  Once a month, do a whole body skin exam, using a mirror to look at the skin on your back. Tell your health care provider of new moles,  moles that have irregular borders, moles that are larger than a pencil eraser, or moles that have changed in shape or color.  Stay current with required vaccines (immunizations).  Influenza vaccine. All adults should be immunized every year.  Tetanus, diphtheria, and acellular pertussis (Td, Tdap) vaccine. Pregnant women should receive 1 dose of Tdap vaccine during each pregnancy. The dose should be obtained regardless of the length of time since the last dose. Immunization is preferred during the 27th-36th week of gestation. An adult who has not previously received Tdap or who does not know her vaccine status should receive 1 dose of Tdap. This initial dose should be followed by tetanus and diphtheria toxoids (Td) booster doses every 10 years. Adults with an unknown or incomplete history of completing a 3-dose immunization series with Td-containing vaccines should begin or complete a primary immunization series including a Tdap dose. Adults should receive a Td booster every 10 years.  Varicella vaccine. An adult without evidence of immunity to varicella should receive 2 doses or a second dose if she has previously received 1 dose. Pregnant females who do not have evidence of immunity should receive the first dose after pregnancy. This first dose should be obtained before leaving the health care facility. The second dose should be obtained 4-8 weeks after the first dose.  Human papillomavirus (HPV) vaccine. Females aged 13-26 years who have not received the vaccine previously should obtain the 3-dose series. The vaccine is not recommended for use in pregnant females. However, pregnancy testing is not needed before receiving a dose. If a female is found to be pregnant after receiving a dose, no treatment is needed. In that case, the remaining doses should be delayed until after the pregnancy. Immunization is recommended for any person with an immunocompromised condition through the age of 75 years if she  did not get any or all doses earlier. During the 3-dose series, the second dose should be obtained 4-8 weeks after the first dose. The third dose should be obtained 24 weeks after the first dose and 16 weeks after the second dose.  Zoster vaccine. One dose is recommended for adults  aged 68 years or older unless certain conditions are present.  Measles, mumps, and rubella (MMR) vaccine. Adults born before 20 generally are considered immune to measles and mumps. Adults born in [birth year] or later should have 1 or more doses of MMR vaccine unless there is a contraindication to the vaccine or there is laboratory evidence of immunity to each of the three diseases. A routine second dose of MMR vaccine should be obtained at least 28 days after the first dose for students attending postsecondary schools, health care workers, or international travelers. People who received inactivated measles vaccine or an unknown type of measles vaccine during 1963-1967 should receive 2 doses of MMR vaccine. People who received inactivated mumps vaccine or an unknown type of mumps vaccine before 1979 and are at high risk for mumps infection should consider immunization with 2 doses of MMR vaccine. For females of childbearing age, rubella immunity should be determined. If there is no evidence of immunity, females who are not pregnant should be vaccinated. If there is no evidence of immunity, females who are pregnant should delay immunization until after pregnancy. Unvaccinated health care workers born before [birth year] who lack laboratory evidence of measles, mumps, or rubella immunity or laboratory confirmation of disease should consider measles and mumps immunization with 2 doses of MMR vaccine or rubella immunization with 1 dose of MMR vaccine.  Pneumococcal 13-valent conjugate (PCV13) vaccine. When indicated, a person who is uncertain of her immunization history and has no record of immunization should receive the PCV13 vaccine. An adult  aged 59 years or older who has certain medical conditions and has not been previously immunized should receive 1 dose of PCV13 vaccine. This PCV13 should be followed with a dose of pneumococcal polysaccharide (PPSV23) vaccine. The PPSV23 vaccine dose should be obtained at least 8 weeks after the dose of PCV13 vaccine. An adult aged 27 years or older who has certain medical conditions and previously received 1 or more doses of PPSV23 vaccine should receive 1 dose of PCV13. The PCV13 vaccine dose should be obtained 1 or more years after the last PPSV23 vaccine dose.  Pneumococcal polysaccharide (PPSV23) vaccine. When PCV13 is also indicated, PCV13 should be obtained first. All adults aged 60 years and older should be immunized. An adult younger than age 37 years who has certain medical conditions should be immunized. Any person who resides in a nursing home or long-term care facility should be immunized. An adult smoker should be immunized. People with an immunocompromised condition and certain other conditions should receive both PCV13 and PPSV23 vaccines. People with human immunodeficiency virus (HIV) infection should be immunized as soon as possible after diagnosis. Immunization during chemotherapy or radiation therapy should be avoided. Routine use of PPSV23 vaccine is not recommended for American Indians, [place] Natives, or people younger than 65 years unless there are medical conditions that require PPSV23 vaccine. When indicated, people who have unknown immunization and have no record of immunization should receive PPSV23 vaccine. One-time revaccination 5 years after the first dose of PPSV23 is recommended for people aged 19-64 years who have chronic kidney failure, nephrotic syndrome, asplenia, or immunocompromised conditions. People who received 1-2 doses of PPSV23 before age 43 years should receive another dose of PPSV23 vaccine at age 37 years or later if at least 5 years have passed since the previous  dose. Doses of PPSV23 are not needed for people immunized with PPSV23 at or after age 60 years.  Meningococcal vaccine. Adults with asplenia or persistent complement component  deficiencies should receive 2 doses of quadrivalent meningococcal conjugate (MenACWY-D) vaccine. The doses should be obtained at least 2 months apart. Microbiologists working with certain meningococcal bacteria, [employer] recruits, people at risk during an outbreak, and people who travel to or live in countries with a high rate of meningitis should be immunized. A first-year college student up through age 37 years who is living in a residence hall should receive a dose if she did not receive a dose on or after her 16th birthday. Adults who have certain high-risk conditions should receive one or more doses of vaccine.  Hepatitis A vaccine. Adults who wish to be protected from this disease, have certain high-risk conditions, work with hepatitis A-infected animals, work in hepatitis A research labs, or travel to or work in countries with a high rate of hepatitis A should be immunized. Adults who were previously unvaccinated and who anticipate close contact with an international adoptee during the first 60 days after arrival in the [place] from a country with a high rate of hepatitis A should be immunized.  Hepatitis B vaccine. Adults who wish to be protected from this disease, have certain high-risk conditions, may be exposed to blood or other infectious body fluids, are household contacts or sex partners of hepatitis B positive people, are clients or workers in certain care facilities, or travel to or work in countries with a high rate of hepatitis B should be immunized.  Haemophilus influenzae type b (Hib) vaccine. A previously unvaccinated person with asplenia or sickle cell disease or having a scheduled splenectomy should receive 1 dose of Hib vaccine. Regardless of previous immunization, a recipient of a hematopoietic stem cell  transplant should receive a 3-dose series 6-12 months after her successful transplant. Hib vaccine is not recommended for adults with HIV infection. Preventive Services / Frequency Ages 48 to 63 years  Blood pressure check.** / Every 1 to 2 years.  Lipid and cholesterol check.** / Every 5 years beginning at age 3.  Clinical breast exam.** / Every 3 years for women in their 55s and 15s.  BRCA-related cancer risk assessment.** / For women who have family members with a BRCA-related cancer (breast, ovarian, tubal, or peritoneal cancers).  Pap test.** / Every 2 years from ages 1 through 12. Every 3 years starting at age 5 through age 64 or 34 with a history of 3 consecutive normal Pap tests.  HPV screening.** / Every 3 years from ages 20 through ages 56 to 70 with a history of 3 consecutive normal Pap tests.  Hepatitis C blood test.** / For any individual with known risks for hepatitis C.  Skin self-exam. / Monthly.  Influenza vaccine. / Every year.  Tetanus, diphtheria, and acellular pertussis (Tdap, Td) vaccine.** / Consult your health care provider. Pregnant women should receive 1 dose of Tdap vaccine during each pregnancy. 1 dose of Td every 10 years.  Varicella vaccine.** / Consult your health care provider. Pregnant females who do not have evidence of immunity should receive the first dose after pregnancy.  HPV vaccine. / 3 doses over 6 months, if 36 and younger. The vaccine is not recommended for use in pregnant females. However, pregnancy testing is not needed before receiving a dose.  Measles, mumps, rubella (MMR) vaccine.** / You need at least 1 dose of MMR if you were born in 1957 or later. You may also need a 2nd dose. For females of childbearing age, rubella immunity should be determined. If there is no evidence of immunity,  females who are not pregnant should be vaccinated. If there is no evidence of immunity, females who are pregnant should delay immunization until after  pregnancy.  Pneumococcal 13-valent conjugate (PCV13) vaccine.** / Consult your health care provider.  Pneumococcal polysaccharide (PPSV23) vaccine.** / 1 to 2 doses if you smoke cigarettes or if you have certain conditions.  Meningococcal vaccine.** / 1 dose if you are age 1 to 54 years and a Market researcher living in a residence hall, or have one of several medical conditions, you need to get vaccinated against meningococcal disease. You may also need additional booster doses.  Hepatitis A vaccine.** / Consult your health care provider.  Hepatitis B vaccine.** / Consult your health care provider.  Haemophilus influenzae type b (Hib) vaccine.** / Consult your health care provider. Ages 52 to 20 years  Blood pressure check.** / Every 1 to 2 years.  Lipid and cholesterol check.** / Every 5 years beginning at age 21 years.  Lung cancer screening. / Every year if you are aged 26-80 years and have a 30-pack-year history of smoking and currently smoke or have quit within the past 15 years. Yearly screening is stopped once you have quit smoking for at least 15 years or develop a health problem that would prevent you from having lung cancer treatment.  Clinical breast exam.** / Every year after age 21 years.  BRCA-related cancer risk assessment.** / For women who have family members with a BRCA-related cancer (breast, ovarian, tubal, or peritoneal cancers).  Mammogram.** / Every year beginning at age 36 years and continuing for as long as you are in good health. Consult with your health care provider.  Pap test.** / Every 3 years starting at age 31 years through age 63 or 33 years with a history of 3 consecutive normal Pap tests.  HPV screening.** / Every 3 years from ages 55 years through ages 14 to 48 years with a history of 3 consecutive normal Pap tests.  Fecal occult blood test (FOBT) of stool. / Every year beginning at age 55 years and continuing until age 27 years. You may  not need to do this test if you get a colonoscopy every 10 years.  Flexible sigmoidoscopy or colonoscopy.** / Every 5 years for a flexible sigmoidoscopy or every 10 years for a colonoscopy beginning at age 12 years and continuing until age 58 years.  Hepatitis C blood test.** / For all people born from [birth year] through 1965 and any individual with known risks for hepatitis C.  Skin self-exam. / Monthly.  Influenza vaccine. / Every year.  Tetanus, diphtheria, and acellular pertussis (Tdap/Td) vaccine.** / Consult your health care provider. Pregnant women should receive 1 dose of Tdap vaccine during each pregnancy. 1 dose of Td every 10 years.  Varicella vaccine.** / Consult your health care provider. Pregnant females who do not have evidence of immunity should receive the first dose after pregnancy.  Zoster vaccine.** / 1 dose for adults aged 72 years or older.  Measles, mumps, rubella (MMR) vaccine.** / You need at least 1 dose of MMR if you were born in 1957 or later. You may also need a 2nd dose. For females of childbearing age, rubella immunity should be determined. If there is no evidence of immunity, females who are not pregnant should be vaccinated. If there is no evidence of immunity, females who are pregnant should delay immunization until after pregnancy.  Pneumococcal 13-valent conjugate (PCV13) vaccine.** / Consult your health care provider.  Pneumococcal polysaccharide (  PPSV23) vaccine.** / 1 to 2 doses if you smoke cigarettes or if you have certain conditions.  Meningococcal vaccine.** / Consult your health care provider.  Hepatitis A vaccine.** / Consult your health care provider.  Hepatitis B vaccine.** / Consult your health care provider.  Haemophilus influenzae type b (Hib) vaccine.** / Consult your health care provider. Ages 71 years and over  Blood pressure check.** / Every 1 to 2 years.  Lipid and cholesterol check.** / Every 5 years beginning at age 28 years.  Lung  cancer screening. / Every year if you are aged 72-80 years and have a 30-pack-year history of smoking and currently smoke or have quit within the past 15 years. Yearly screening is stopped once you have quit smoking for at least 15 years or develop a health problem that would prevent you from having lung cancer treatment.  Clinical breast exam.** / Every year after age 39 years.  BRCA-related cancer risk assessment.** / For women who have family members with a BRCA-related cancer (breast, ovarian, tubal, or peritoneal cancers).  Mammogram.** / Every year beginning at age 23 years and continuing for as long as you are in good health. Consult with your health care provider.  Pap test.** / Every 3 years starting at age 60 years through age 33 or 77 years with 3 consecutive normal Pap tests. Testing can be stopped between 65 and 70 years with 3 consecutive normal Pap tests and no abnormal Pap or HPV tests in the past 10 years.  HPV screening.** / Every 3 years from ages 34 years through ages 56 or 35 years with a history of 3 consecutive normal Pap tests. Testing can be stopped between 65 and 70 years with 3 consecutive normal Pap tests and no abnormal Pap or HPV tests in the past 10 years.  Fecal occult blood test (FOBT) of stool. / Every year beginning at age 35 years and continuing until age 49 years. You may not need to do this test if you get a colonoscopy every 10 years.  Flexible sigmoidoscopy or colonoscopy.** / Every 5 years for a flexible sigmoidoscopy or every 10 years for a colonoscopy beginning at age 48 years and continuing until age 35 years.  Hepatitis C blood test.** / For all people born from [birth year] through 1967 and any individual with known risks for hepatitis C.  Osteoporosis screening.** / A one-time screening for women ages 60 years and over and women at risk for fractures or osteoporosis.  Skin self-exam. / Monthly.  Influenza vaccine. / Every year.  Tetanus, diphtheria, and  acellular pertussis (Tdap/Td) vaccine.** / 1 dose of Td every 10 years.  Varicella vaccine.** / Consult your health care provider.  Zoster vaccine.** / 1 dose for adults aged 15 years or older.  Pneumococcal 13-valent conjugate (PCV13) vaccine.** / Consult your health care provider.  Pneumococcal polysaccharide (PPSV23) vaccine.** / 1 dose for all adults aged 88 years and older.  Meningococcal vaccine.** / Consult your health care provider.  Hepatitis A vaccine.** / Consult your health care provider.  Hepatitis B vaccine.** / Consult your health care provider.  Haemophilus influenzae type b (Hib) vaccine.** / Consult your health care provider. ** Family history and personal history of risk and conditions may change your health care provider's recommendations. Document Released: 12/22/2001 Document Revised: 03/12/2014 Document Reviewed: 03/23/2011 [facility] Patient Information 2015 [place], [place]. This information is not intended to replace advice given to you by your health care provider. Make sure you discuss any questions  you have with your health care provider.

## 2015-02-24 ENCOUNTER — Encounter: Payer: Self-pay | Admitting: Family Medicine

## 2015-02-24 DIAGNOSIS — F32A Depression, unspecified: Secondary | ICD-10-CM | POA: Insufficient documentation

## 2015-02-24 DIAGNOSIS — E785 Hyperlipidemia, unspecified: Secondary | ICD-10-CM | POA: Insufficient documentation

## 2015-02-24 DIAGNOSIS — F329 Major depressive disorder, single episode, unspecified: Secondary | ICD-10-CM | POA: Insufficient documentation

## 2015-02-24 DIAGNOSIS — I639 Cerebral infarction, unspecified: Secondary | ICD-10-CM | POA: Insufficient documentation

## 2015-02-24 DIAGNOSIS — M1990 Unspecified osteoarthritis, unspecified site: Secondary | ICD-10-CM | POA: Insufficient documentation

## 2015-02-24 DIAGNOSIS — I509 Heart failure, unspecified: Secondary | ICD-10-CM | POA: Insufficient documentation

## 2015-02-24 DIAGNOSIS — I10 Essential (primary) hypertension: Secondary | ICD-10-CM | POA: Insufficient documentation

## 2015-02-24 HISTORY — DX: Heart failure, unspecified: I50.9

## 2015-02-24 NOTE — Assessment & Plan Note (Signed)
Encouraged heart healthy diet, increase exercise, avoid trans fats, consider a krill oil cap daily 

## 2015-02-24 NOTE — Assessment & Plan Note (Signed)
Symptomatically better since placement of pacemaker, will request old records from cardiology and [facility]

## 2015-02-24 NOTE — Assessment & Plan Note (Signed)
Encouraged increased hydration and fiber in diet. Daily probiotics. If bowels not moving can use MOM 2 tbls po in 4 oz of warm prune juice by mouth every 2-3 days. If no results then repeat in 4 hours with  Dulcolax suppository pr, may repeat again in 4 more hours as needed. Seek care if symptoms worsen. Consider daily Miralax and/or Dulcolax if symptoms persist. Is using colace

## 2015-02-24 NOTE — Assessment & Plan Note (Addendum)
Poorly controlled but did not take meds today no change to medications, encouraged DASH diet, minimize caffeine and obtain adequate sleep. Report concerning symptoms and follow up as directed and as needed

## 2015-02-24 NOTE — Assessment & Plan Note (Signed)
Avoid offending foods, start probiotics. Do not eat large meals in late evening and consider raising head of bed. H Pylori is negative. Start ranitidine and consider referral if no improvement

## 2015-02-24 NOTE — Assessment & Plan Note (Signed)
Does not feel Fluoxetine is helpful, will try Lexapro

## 2015-02-24 NOTE — Progress Notes (Signed)
Tami [name]  [id number] [birth year] 02/24/2015      Progress Note-Follow Up  Subjective  Chief Complaint  Chief Complaint  Patient presents with  . Establish Care    Weight loss concerns and fatigue. Hx of CHF and pacemaker.    HPI  Patient is a 59 y.o. female in today for routine medical care. Patient is in today for new patient appointment. Has had a difficult year with heart disease and the placement of a pacemaker. Was struggling with bradycardia and CHF and feels much better. Did also a bowel obstruction but is no longer having trouble with constipation. Unfortunately she continues to show with heartburn and epigastric discomfort. She struggles with chronic fatigue and is frustrated regarding her abdominal pain and weight loss. Says she was very little appetite. Denies CP/palp/SOB/HA/congestion/fevers or GU c/o. Taking meds as prescribed  Past Medical History  Diagnosis Date  . CHF (congestive heart failure)   . Bowel obstruction 11/2014  . GERD (gastroesophageal reflux disease)   . Arthritis     knees, hands  . Hyperlipidemia   . Hypertension   . Depression   . Stroke     TIAs    Past Surgical History  Procedure Laterality Date  . Pacemaker insertion  08/2014  . Cholecystectomy    . Tubal ligation    . Knee surgery      right, repair torn torn cartialage  . Abdominal hysterectomy      menorraghia, 2006, total  . Tonsillectomy      Family History  Problem Relation Age of Onset  . Diabetes Mother   . Hypertension Mother   . Heart disease Mother     s/p 1 stent  . Hyperlipidemia Mother   . Arthritis Mother   . Cancer Father     COLON  . Colon cancer Father 53  . Irritable bowel syndrome Sister   . Hyperlipidemia Daughter   . Hypertension Daughter   . Hypertension Maternal Grandmother   . Arthritis Maternal Grandmother   . Heart disease Maternal Grandfather     MI  . Hypertension Maternal Grandfather   . Arthritis Maternal Grandfather   .  Hypertension Son     History   Social History  . Marital Status: Married    Spouse Name: N/A  . Number of Children: N/A  . Years of Education: N/A   Occupational History  . [name].   Social History Main Topics  . Smoking status: Never Smoker   . Smokeless tobacco: [name]  . Alcohol Use: No  . Drug Use: No  . Sexual Activity: No     Comment: Lives alone, no dietary restrictions   Other Topics Concern  . [name]   Social History Narrative    No current outpatient prescriptions on file prior to visit.   No current facility-administered medications on file prior to visit.    Allergies  Allergen Reactions  . Codeine Nausea And Vomiting    Review of Systems  Review of Systems  Constitutional: Positive for malaise/fatigue. Negative for fever and chills.  HENT: Negative for congestion, hearing loss and nosebleeds.   Eyes: Negative for discharge.  Respiratory: Negative for cough, sputum production, shortness of breath and wheezing.   Cardiovascular: Negative for chest pain, palpitations and leg swelling.  Gastrointestinal: Positive for heartburn and abdominal pain. Negative for nausea, vomiting, diarrhea, constipation and blood in stool.  Genitourinary: Negative for dysuria, urgency, frequency and hematuria.  Musculoskeletal: Negative for  myalgias, back pain and falls.  Skin: Negative for rash.  Neurological: Negative for dizziness, tremors, sensory change, focal weakness, loss of consciousness, weakness and headaches.  Endo/Heme/Allergies: Negative for polydipsia. Does not bruise/bleed easily.  Psychiatric/Behavioral: Negative for depression and suicidal ideas. The patient is not nervous/anxious and does not have insomnia.     Objective  BP 162/108 mmHg  Pulse 91  Temp(Src) 97.7 F (36.5 C) (Oral)  Resp 18  Ht 5\' 6"  (1.676 m)  Wt 161 lb (73.029 kg)  BMI 26.00 kg/m2  SpO2 99%  Physical Exam  Physical Exam  Constitutional: She is oriented to  person, place, and time and well-developed, well-nourished, and in no distress. No distress.  HENT:  Head: Normocephalic and atraumatic.  Right Ear: External ear normal.  Left Ear: External ear normal.  Nose: Nose normal.  Mouth/Throat: Oropharynx is clear and moist. No oropharyngeal exudate.  Eyes: Conjunctivae are normal. Pupils are equal, round, and reactive to light. Right eye exhibits no discharge. Left eye exhibits no discharge. No scleral icterus.  Neck: Normal range of motion. Neck supple. No thyromegaly present.  Cardiovascular: Normal rate, regular rhythm, normal heart sounds and intact distal pulses.   No murmur heard. Pulmonary/Chest: Effort normal and breath sounds normal. No respiratory distress. She has no wheezes. She has no rales.  Abdominal: Soft. Bowel sounds are normal. She exhibits no distension and no mass. There is no tenderness.  Musculoskeletal: Normal range of motion. She exhibits no edema or tenderness.  Lymphadenopathy:    She has no cervical adenopathy.  Neurological: She is alert and oriented to person, place, and time. She has normal reflexes. No cranial nerve deficit. Coordination normal.  Skin: Skin is warm and dry. No rash noted. She is not diaphoretic.  Psychiatric: Mood, memory and affect normal.    Lab Results  Component Value Date   TSH 0.77 02/21/2015   Lab Results  Component Value Date   WBC 4.0 02/21/2015   HGB 12.3 02/21/2015   HCT 36.7 02/21/2015   MCV 88.0 02/21/2015   PLT 166.0 02/21/2015   Lab Results  Component Value Date   CREATININE 0.67 02/21/2015   BUN 19 02/21/2015   NA 140 02/21/2015   K 3.4* 02/21/2015   CL 106 02/21/2015   CO2 27 02/21/2015   Lab Results  Component Value Date   ALT 16 02/21/2015   AST 18 02/21/2015   ALKPHOS 44 02/21/2015   BILITOT 0.6 02/21/2015    Assessment & Plan  Hypertension Poorly controlled but did not take meds today no change to medications, encouraged DASH diet, minimize caffeine  and obtain adequate sleep. Report concerning symptoms and follow up as directed and as needed   GERD (gastroesophageal reflux disease) Avoid offending foods, start probiotics. Do not eat large meals in late evening and consider raising head of bed. H Pylori is negative. Start ranitidine and consider referral if no improvement   Bowel obstruction Encouraged increased hydration and fiber in diet. Daily probiotics. If bowels not moving can use MOM 2 tbls po in 4 oz of warm prune juice by mouth every 2-3 days. If no results then repeat in 4 hours with  Dulcolax suppository pr, may repeat again in 4 more hours as needed. Seek care if symptoms worsen. Consider daily Miralax and/or Dulcolax if symptoms persist. Is using colace   Hyperlipidemia Encouraged heart healthy diet, increase exercise, avoid trans fats, consider a krill oil cap daily   Depression Does not feel Fluoxetine is helpful,  will try Lexapro   Congestive heart failure Symptomatically better since placement of pacemaker, will request old records from cardiology and [facility]

## 2015-02-28 ENCOUNTER — Telehealth: Payer: Self-pay | Admitting: *Deleted

## 2015-02-28 NOTE — Telephone Encounter (Signed)
Medical records received via mail from [facility]. Forwarded to Robin/Dr. Charlett Blake. JG//CMA

## 2015-03-05 ENCOUNTER — Encounter: Payer: Self-pay | Admitting: Family Medicine

## 2015-03-11 ENCOUNTER — Encounter: Payer: Self-pay | Admitting: Family Medicine

## 2015-03-19 ENCOUNTER — Encounter: Payer: Self-pay | Admitting: Family Medicine

## 2015-03-19 ENCOUNTER — Ambulatory Visit (INDEPENDENT_AMBULATORY_CARE_PROVIDER_SITE_OTHER): Payer: Managed Care, Other (non HMO) | Admitting: Family Medicine

## 2015-03-19 VITALS — BP 132/84 | HR 86 | Temp 98.0°F | Ht 66.0 in | Wt 163.2 lb

## 2015-03-19 DIAGNOSIS — R112 Nausea with vomiting, unspecified: Secondary | ICD-10-CM | POA: Diagnosis not present

## 2015-03-19 DIAGNOSIS — G4733 Obstructive sleep apnea (adult) (pediatric): Secondary | ICD-10-CM | POA: Diagnosis not present

## 2015-03-19 DIAGNOSIS — I10 Essential (primary) hypertension: Secondary | ICD-10-CM

## 2015-03-19 DIAGNOSIS — K2100 Gastro-esophageal reflux disease with esophagitis, without bleeding: Secondary | ICD-10-CM

## 2015-03-19 DIAGNOSIS — I509 Heart failure, unspecified: Secondary | ICD-10-CM

## 2015-03-19 MED ORDER — PROMETHAZINE HCL 25 MG PO TABS: 25.0000 mg | ORAL_TABLET | Freq: Four times a day (QID) | ORAL | Status: DC | PRN

## 2015-03-19 MED ORDER — POTASSIUM CHLORIDE CRYS ER 20 MEQ PO TBCR: 20.0000 meq | EXTENDED_RELEASE_TABLET | Freq: Every day | ORAL | Status: DC

## 2015-03-19 MED ORDER — ONDANSETRON HCL 4 MG/2ML IJ SOLN
8.0000 mg | Freq: Once | INTRAMUSCULAR | Status: AC
  Administered 2015-03-19: 8 mg via INTRAMUSCULAR

## 2015-03-19 MED ORDER — PANTOPRAZOLE SODIUM 40 MG PO TBEC: 40.0000 mg | DELAYED_RELEASE_TABLET | Freq: Two times a day (BID) | ORAL | Status: DC

## 2015-03-19 MED ORDER — AMLODIPINE BESYLATE 5 MG PO TABS: 5.0000 mg | ORAL_TABLET | Freq: Every day | ORAL | Status: DC

## 2015-03-19 MED ORDER — BENAZEPRIL HCL 20 MG PO TABS: 20.0000 mg | ORAL_TABLET | Freq: Two times a day (BID) | ORAL | Status: DC

## 2015-03-19 NOTE — Progress Notes (Signed)
Pre visit review using our clinic review tool, if applicable. No additional management support is needed unless otherwise documented below in the visit note. 

## 2015-03-19 NOTE — Patient Instructions (Addendum)
Needs appt for this Friday for lab only after 3 pm  Hiatal Hernia A hiatal hernia occurs when part of your stomach slides above the muscle that separates your abdomen from your chest (diaphragm). You can be born with a hiatal hernia (congenital), or it may develop over time. In almost all cases of hiatal hernia, only the top part of the stomach pushes through.  Many people have a hiatal hernia with no symptoms. The larger the hernia, the more likely that you will have symptoms. In some cases, a hiatal hernia allows stomach acid to flow back into the tube that carries food from your mouth to your stomach (esophagus). This may cause heartburn symptoms. Severe heartburn symptoms may mean you have developed a condition called gastroesophageal reflux disease (GERD).  CAUSES  Hiatal hernias are caused by a weakness in the opening (hiatus) where your esophagus passes through your diaphragm to attach to the upper part of your stomach. You may be born with a weakness in your hiatus, or a weakness can develop. RISK FACTORS Older age is a major risk factor for a hiatal hernia. Anything that increases pressure on your diaphragm can also increase your risk of a hiatal hernia. This includes:  Pregnancy.  Excess weight.  Frequent constipation. SIGNS AND SYMPTOMS  People with a hiatal hernia often have no symptoms. If symptoms develop, they are almost always caused by GERD. They may include:  Heartburn.  Belching.  Indigestion.  Trouble swallowing.  Coughing or wheezing.  Sore throat.  Hoarseness.  Chest pain. DIAGNOSIS  A hiatal hernia is sometimes found during an exam for another problem. Your health care provider may suspect a hiatal hernia if you have symptoms of GERD. Tests may be done to diagnose GERD. These may include:  X-rays of your stomach or chest.  An upper gastrointestinal (GI) series. This is an X-ray exam of your GI tract involving the use of a chalky liquid that you swallow.  The liquid shows up clearly on the X-ray.  Endoscopy. This is a procedure to look into your stomach using a thin, flexible tube that has a tiny camera and light on the end of it. TREATMENT  If you have no symptoms, you may not need treatment. If you have symptoms, treatment may include:  Dietary and lifestyle changes to help reduce GERD symptoms.  Medicines. These may include:  Over-the-counter antacids.  Medicines that make your stomach empty more quickly.  Medicines that block the production of stomach acid (H2 blockers).  Stronger medicines to reduce stomach acid (proton pump inhibitors).  You may need surgery to repair the hernia if other treatments are not helping. HOME CARE INSTRUCTIONS   Take all medicines as directed by your health care provider.  Quit smoking, if you smoke.  Try to achieve and maintain a healthy body weight.  Eat frequent small meals instead of three large meals a day. This keeps your stomach from getting too full.  Eat slowly.  Do not lie down right after eating.  Do noteat 1-2 hours before bed.   Do not drink beverages with caffeine. These include cola, coffee, cocoa, and tea.  Do not drink alcohol.  Avoid foods that can make symptoms of GERD worse. These may include:  Fatty foods.  Citrus fruits.  Other foods and drinks that contain acid.  Avoid putting pressure on your belly. Anything that puts pressure on your belly increases the amount of acid that may be pushed up into your esophagus.   Avoid  bending over, especially after eating.  Raise the head of your bed by putting blocks under the legs. This keeps your head and esophagus higher than your stomach.  Do not wear tight clothing around your chest or stomach.  Try not to strain when having a bowel movement, when urinating, or when lifting heavy objects. SEEK MEDICAL CARE IF:  Your symptoms are not controlled with medicines or lifestyle changes.  You are having trouble  swallowing.  You have coughing or wheezing that will not go away. SEEK IMMEDIATE MEDICAL CARE IF:  Your pain is getting worse.  Your pain spreads to your arms, neck, jaw, teeth, or back.  You have shortness of breath.  You sweat for no reason.  You feel sick to your stomach (nauseous) or vomit.  You vomit blood.  You have bright red blood in your stools.  You have black, tarry stools.  Document Released: 01/16/2004 Document Revised: 03/12/2014 Document Reviewed: 10/13/2013 [facility] Patient Information 2015 [place], [place]. This information is not intended to replace advice given to you by your health care provider. Make sure you discuss any questions you have with your health care provider.

## 2015-03-20 ENCOUNTER — Encounter: Payer: Self-pay | Admitting: Family Medicine

## 2015-03-20 ENCOUNTER — Other Ambulatory Visit (INDEPENDENT_AMBULATORY_CARE_PROVIDER_SITE_OTHER): Payer: Managed Care, Other (non HMO)

## 2015-03-20 DIAGNOSIS — K2100 Gastro-esophageal reflux disease with esophagitis, without bleeding: Secondary | ICD-10-CM

## 2015-03-20 DIAGNOSIS — R112 Nausea with vomiting, unspecified: Secondary | ICD-10-CM | POA: Diagnosis not present

## 2015-03-21 LAB — H. PYLORI ANTIBODY, IGG: H PYLORI IGG: NEGATIVE

## 2015-03-21 LAB — COMPREHENSIVE METABOLIC PANEL
ALBUMIN: 3.7 g/dL (ref 3.5–5.2)
ALT: 22 U/L (ref 0–35)
AST: 40 U/L — AB (ref 0–37)
Alkaline Phosphatase: 40 U/L (ref 39–117)
BUN: 16 mg/dL (ref 6–23)
CO2: 29 mEq/L (ref 19–32)
Calcium: 9.6 mg/dL (ref 8.4–10.5)
Chloride: 106 mEq/L (ref 96–112)
Creatinine, Ser: 1.36 mg/dL — ABNORMAL HIGH (ref 0.40–1.20)
GFR: 50.87 mL/min — ABNORMAL LOW (ref >60.00)
Glucose, Bld: 99 mg/dL (ref 70–99)
Potassium: 4.2 mEq/L (ref 3.5–5.1)
SODIUM: 140 meq/L (ref 135–145)
Total Bilirubin: 0.4 mg/dL (ref 0.2–1.2)
Total Protein: 7.8 g/dL (ref 6.0–8.3)

## 2015-03-25 ENCOUNTER — Other Ambulatory Visit: Payer: Self-pay | Admitting: Family Medicine

## 2015-03-25 DIAGNOSIS — R109 Unspecified abdominal pain: Secondary | ICD-10-CM

## 2015-03-31 ENCOUNTER — Encounter: Payer: Self-pay | Admitting: Family Medicine

## 2015-03-31 DIAGNOSIS — G4733 Obstructive sleep apnea (adult) (pediatric): Secondary | ICD-10-CM | POA: Insufficient documentation

## 2015-03-31 HISTORY — DX: Obstructive sleep apnea (adult) (pediatric): G47.33

## 2015-03-31 NOTE — Assessment & Plan Note (Signed)
Asymptomatic at this time 

## 2015-03-31 NOTE — Progress Notes (Signed)
Tami [name]  [id number] 04/02/1956 03/31/2015      Progress Note-Follow Up  Subjective  Chief Complaint  Chief Complaint  Patient presents with  . Abdominal Pain    HPI  Patient is a 59 y.o. female in today for routine medical care. Patient is in today for follow-up on abdominal pain. Her pain is better but her heartburn and discomfort are still present. She has increased her pantoprazole to twice daily and that has been marginally helpful. She's had no bloody or tarry stool in her bowels continue to move. There is been no other recent illness although she does report having a bowel obstruction in January 2016. Paperwork is not available from that episode. She did have a pacemaker placed in October 2015. She denies any other acute complaints. Denies CP/palp/SOB/HA/congestion/fevers or GU c/o. Taking meds as prescribed  Past Medical History  Diagnosis Date  . CHF (congestive heart failure)   . Bowel obstruction 11/2014  . GERD (gastroesophageal reflux disease)   . Arthritis     knees, hands  . Hyperlipidemia   . Hypertension   . Depression   . Stroke     TIAs  . Congestive heart failure 02/24/2015    S/p pacemaker  . Obstructive sleep apnea 03/31/2015    Past Surgical History  Procedure Laterality Date  . Pacemaker insertion  08/2014  . Cholecystectomy    . Tubal ligation    . Knee surgery      right, repair torn torn cartialage  . Abdominal hysterectomy      menorraghia, 2006, total  . Tonsillectomy      Family History  Problem Relation Age of Onset  . Diabetes Mother   . Hypertension Mother   . Heart disease Mother     s/p 1 stent  . Hyperlipidemia Mother   . Arthritis Mother   . Cancer Father     COLON  . Colon cancer Father 5  . Irritable bowel syndrome Sister   . Hyperlipidemia Daughter   . Hypertension Daughter   . Hypertension Maternal Grandmother   . Arthritis Maternal Grandmother   . Heart disease Maternal Grandfather     MI  . Hypertension  Maternal Grandfather   . Arthritis Maternal Grandfather   . Hypertension Son     History   Social History  . Marital Status: Married    Spouse Name: N/A  . Number of Children: N/A  . Years of Education: N/A   Occupational History  . [name].   Social History Main Topics  . Smoking status: Never Smoker   . Smokeless tobacco: [name]  . Alcohol Use: No  . Drug Use: No  . Sexual Activity: No     Comment: Lives alone, no dietary restrictions   Other Topics Concern  . [name]   Social History Narrative    Current Outpatient Prescriptions on File Prior to Visit  Medication Sig Dispense Refill  . carvedilol (COREG) 12.5 MG tablet Take 1 tablet (12.5 mg total) by mouth 2 (two) times daily with a meal. 60 tablet 3  . docusate sodium (COLACE) 50 MG capsule Take 50 mg by mouth 2 (two) times daily.    Marland Kitchen escitalopram (LEXAPRO) 20 MG tablet Take 1 tablet (20 mg total) by mouth daily. 30 tablet 3  . furosemide (LASIX) 20 MG tablet Take 20 mg by mouth.     No current facility-administered medications on file prior to visit.    Allergies  Allergen Reactions  . Codeine Nausea And Vomiting    Review of Systems  Review of Systems  Constitutional: Negative for fever and malaise/fatigue.  HENT: Negative for congestion.   Eyes: Negative for discharge.  Respiratory: Negative for shortness of breath.   Cardiovascular: Negative for chest pain, palpitations and leg swelling.  Gastrointestinal: Positive for heartburn, nausea and abdominal pain. Negative for vomiting and diarrhea.  Genitourinary: Negative for dysuria.  Musculoskeletal: Negative for falls.  Skin: Negative for rash.  Neurological: Negative for loss of consciousness and headaches.  Endo/Heme/Allergies: Negative for polydipsia.  Psychiatric/Behavioral: Negative for depression and suicidal ideas. The patient is not nervous/anxious and does not have insomnia.     Objective  BP 132/84 mmHg  Pulse 86   Temp(Src) 98 F (36.7 C) (Oral)  Ht 5\' 6"  (1.676 m)  Wt 163 lb 4 oz (74.05 kg)  BMI 26.36 kg/m2  SpO2 96%  Physical Exam  Physical Exam  Constitutional: She is oriented to person, place, and time and well-developed, well-nourished, and in no distress. No distress.  HENT:  Head: Normocephalic and atraumatic.  Eyes: Conjunctivae are normal.  Neck: Neck supple. No thyromegaly present.  Cardiovascular: Normal rate, regular rhythm and normal heart sounds.   No murmur heard. Pulmonary/Chest: Effort normal and breath sounds normal. She has no wheezes.  Abdominal: She exhibits no distension and no mass.  Musculoskeletal: She exhibits no edema.  Lymphadenopathy:    She has no cervical adenopathy.  Neurological: She is alert and oriented to person, place, and time.  Skin: Skin is warm and dry. No rash noted. She is not diaphoretic.  Psychiatric: Memory, affect and judgment normal.    Lab Results  Component Value Date   TSH 0.77 02/21/2015   Lab Results  Component Value Date   WBC 4.0 02/21/2015   HGB 12.3 02/21/2015   HCT 36.7 02/21/2015   MCV 88.0 02/21/2015   PLT 166.0 02/21/2015   Lab Results  Component Value Date   CREATININE 1.36* 03/20/2015   BUN 16 03/20/2015   NA 140 03/20/2015   K 4.2 03/20/2015   CL 106 03/20/2015   CO2 29 03/20/2015   Lab Results  Component Value Date   ALT 22 03/20/2015   AST 40* 03/20/2015   ALKPHOS 40 03/20/2015   BILITOT 0.4 03/20/2015   No results found for: CHOL No results found for: HDL No results found for: LDLCALC No results found for: TRIG No results found for: CHOLHDL   Assessment & Plan  Hypertension Well controlled, no changes to meds. Encouraged heart healthy diet such as the DASH diet and exercise as tolerated.    GERD (gastroesophageal reflux disease) Has increased Pantoprazole to bid. Avoid offending foods, start probiotics. Do not eat large meals in late evening and consider raising head of bed. H Pylori  negative. Referred to gastroenterology for further consideration   Obstructive sleep apnea Has previously been seen elsewhere but would like to transfer care to [facility]   Congestive heart failure Asymptomatic at this time

## 2015-03-31 NOTE — Assessment & Plan Note (Signed)
Well controlled, no changes to meds. Encouraged heart healthy diet such as the DASH diet and exercise as tolerated.  °

## 2015-03-31 NOTE — Assessment & Plan Note (Signed)
Has previously been seen elsewhere but would like to transfer care to [facility]

## 2015-03-31 NOTE — Assessment & Plan Note (Addendum)
Has increased Pantoprazole to bid. Avoid offending foods, start probiotics. Do not eat large meals in late evening and consider raising head of bed. H Pylori negative. Referred to gastroenterology for further consideration

## 2015-04-02 ENCOUNTER — Institutional Professional Consult (permissible substitution): Payer: Managed Care, Other (non HMO) | Admitting: Pulmonary Disease

## 2015-04-18 ENCOUNTER — Encounter: Payer: Self-pay | Admitting: Family Medicine

## 2015-04-19 DIAGNOSIS — T829XXA Unspecified complication of cardiac and vascular prosthetic device, implant and graft, initial encounter: Secondary | ICD-10-CM | POA: Insufficient documentation

## 2015-04-19 DIAGNOSIS — I429 Cardiomyopathy, unspecified: Secondary | ICD-10-CM | POA: Insufficient documentation

## 2015-04-19 DIAGNOSIS — R079 Chest pain, unspecified: Secondary | ICD-10-CM | POA: Insufficient documentation

## 2015-04-19 DIAGNOSIS — R0789 Other chest pain: Secondary | ICD-10-CM | POA: Insufficient documentation

## 2015-04-20 ENCOUNTER — Ambulatory Visit (HOSPITAL_BASED_OUTPATIENT_CLINIC_OR_DEPARTMENT_OTHER): Payer: Managed Care, Other (non HMO)

## 2015-04-20 ENCOUNTER — Encounter (HOSPITAL_BASED_OUTPATIENT_CLINIC_OR_DEPARTMENT_OTHER): Payer: Self-pay

## 2015-04-20 ENCOUNTER — Ambulatory Visit (HOSPITAL_BASED_OUTPATIENT_CLINIC_OR_DEPARTMENT_OTHER)
Admission: RE | Admit: 2015-04-20 | Discharge: 2015-04-20 | Disposition: A | Discharge status: Home / Self Care | Payer: Managed Care, Other (non HMO) | Source: Ambulatory Visit | Attending: Family Medicine | Admitting: Family Medicine

## 2015-04-20 DIAGNOSIS — R160 Hepatomegaly, not elsewhere classified: Secondary | ICD-10-CM | POA: Insufficient documentation

## 2015-04-20 DIAGNOSIS — N8189 Other female genital prolapse: Secondary | ICD-10-CM | POA: Insufficient documentation

## 2015-04-20 DIAGNOSIS — K5900 Constipation, unspecified: Secondary | ICD-10-CM | POA: Insufficient documentation

## 2015-04-20 DIAGNOSIS — R1084 Generalized abdominal pain: Secondary | ICD-10-CM | POA: Diagnosis present

## 2015-04-20 DIAGNOSIS — R7989 Other specified abnormal findings of blood chemistry: Secondary | ICD-10-CM | POA: Diagnosis not present

## 2015-04-20 DIAGNOSIS — R109 Unspecified abdominal pain: Secondary | ICD-10-CM

## 2015-04-20 DIAGNOSIS — R110 Nausea: Secondary | ICD-10-CM | POA: Diagnosis not present

## 2015-04-20 MED ORDER — IOHEXOL 300 MG/ML  SOLN
100.0000 mL | Freq: Once | INTRAMUSCULAR | Status: AC | PRN
  Administered 2015-04-20: 100 mL via INTRAVENOUS

## 2015-04-22 ENCOUNTER — Telehealth: Payer: Self-pay | Admitting: Family Medicine

## 2015-04-22 NOTE — Telephone Encounter (Signed)
Caller name:Feliza Ayad Relationship to patient: self Can be reached:[phone]  Reason for call: PT returning call RE lab work/ tests done over the weekend.

## 2015-05-29 ENCOUNTER — Ambulatory Visit: Payer: Managed Care, Other (non HMO) | Admitting: Internal Medicine

## 2015-05-31 ENCOUNTER — Telehealth: Payer: Self-pay | Admitting: Family Medicine

## 2015-05-31 ENCOUNTER — Encounter: Payer: Self-pay | Admitting: Family Medicine

## 2015-05-31 NOTE — Telephone Encounter (Signed)
Patient no show 05/29/15 - charge or no charge

## 2015-06-05 NOTE — Telephone Encounter (Signed)
Yes, please charge for no show.

## 2015-06-20 ENCOUNTER — Ambulatory Visit (INDEPENDENT_AMBULATORY_CARE_PROVIDER_SITE_OTHER): Payer: Managed Care, Other (non HMO) | Admitting: Family Medicine

## 2015-06-20 ENCOUNTER — Encounter: Payer: Self-pay | Admitting: Family Medicine

## 2015-06-20 VITALS — BP 122/82 | HR 84 | Temp 98.3°F | Ht 66.0 in | Wt 165.1 lb

## 2015-06-20 DIAGNOSIS — R109 Unspecified abdominal pain: Secondary | ICD-10-CM | POA: Diagnosis not present

## 2015-06-20 DIAGNOSIS — J329 Chronic sinusitis, unspecified: Secondary | ICD-10-CM

## 2015-06-20 DIAGNOSIS — K219 Gastro-esophageal reflux disease without esophagitis: Secondary | ICD-10-CM

## 2015-06-20 DIAGNOSIS — I10 Essential (primary) hypertension: Secondary | ICD-10-CM

## 2015-06-20 DIAGNOSIS — G4733 Obstructive sleep apnea (adult) (pediatric): Secondary | ICD-10-CM

## 2015-06-20 MED ORDER — HYOSCYAMINE SULFATE 0.125 MG SL SUBL: 0.1250 mg | SUBLINGUAL_TABLET | SUBLINGUAL | Status: DC | PRN

## 2015-06-20 MED ORDER — MODAFINIL 100 MG PO TABS: 100.0000 mg | ORAL_TABLET | Freq: Every day | ORAL | Status: DC

## 2015-06-20 MED ORDER — AMOXICILLIN-POT CLAVULANATE 875-125 MG PO TABS: 1.0000 | ORAL_TABLET | Freq: Two times a day (BID) | ORAL | Status: DC

## 2015-06-20 NOTE — Patient Instructions (Signed)
Plain Mucinex twice daily Probiotic daily such as Digestive Advantage or [facility]  Sinusitis Sinusitis is redness, soreness, and inflammation of the paranasal sinuses. Paranasal sinuses are air pockets within the bones of your face (beneath the eyes, the middle of the forehead, or above the eyes). In healthy paranasal sinuses, mucus is able to drain out, and air is able to circulate through them by way of your nose. However, when your paranasal sinuses are inflamed, mucus and air can become trapped. This can allow bacteria and other germs to grow and cause infection. Sinusitis can develop quickly and last only a short time (acute) or continue over a long period (chronic). Sinusitis that lasts for more than 12 weeks is considered chronic.  CAUSES  Causes of sinusitis include:  Allergies.  Structural abnormalities, such as displacement of the cartilage that separates your nostrils (deviated septum), which can decrease the air flow through your nose and sinuses and affect sinus drainage.  Functional abnormalities, such as when the small hairs (cilia) that line your sinuses and help remove mucus do not work properly or are not present. SIGNS AND SYMPTOMS  Symptoms of acute and chronic sinusitis are the same. The primary symptoms are pain and pressure around the affected sinuses. Other symptoms include:  Upper toothache.  Earache.  Headache.  Bad breath.  Decreased sense of smell and taste.  A cough, which worsens when you are lying flat.  Fatigue.  Fever.  Thick drainage from your nose, which often is green and may contain pus (purulent).  Swelling and warmth over the affected sinuses. DIAGNOSIS  Your health care provider will perform a physical exam. During the exam, your health care provider may:  Look in your nose for signs of abnormal growths in your nostrils (nasal polyps).  Tap over the affected sinus to check for signs of infection.  View the inside of your  sinuses (endoscopy) using an imaging device that has a light attached (endoscope). If your health care provider suspects that you have chronic sinusitis, one or more of the following tests may be recommended:  Allergy tests.  Nasal culture. A sample of mucus is taken from your nose, sent to a lab, and screened for bacteria.  Nasal cytology. A sample of mucus is taken from your nose and examined by your health care provider to determine if your sinusitis is related to an allergy. TREATMENT  Most cases of acute sinusitis are related to a viral infection and will resolve on their own within 10 days. Sometimes medicines are prescribed to help relieve symptoms (pain medicine, decongestants, nasal steroid sprays, or saline sprays).  However, for sinusitis related to a bacterial infection, your health care provider will prescribe antibiotic medicines. These are medicines that will help kill the bacteria causing the infection.  Rarely, sinusitis is caused by a fungal infection. In theses cases, your health care provider will prescribe antifungal medicine. For some cases of chronic sinusitis, surgery is needed. Generally, these are cases in which sinusitis recurs more than 3 times per year, despite other treatments. HOME CARE INSTRUCTIONS   Drink plenty of water. Water helps thin the mucus so your sinuses can drain more easily.  Use a humidifier.  Inhale steam 3 to 4 times a day (for example, sit in the bathroom with the shower running).  Apply a warm, moist washcloth to your face 3 to 4 times a day, or as directed by your health care provider.  Use saline nasal sprays to help moisten and  clean your sinuses.  Take medicines only as directed by your health care provider.  If you were prescribed either an antibiotic or antifungal medicine, finish it all even if you start to feel better. SEEK IMMEDIATE MEDICAL CARE IF:  You have increasing pain or severe headaches.  You have nausea, vomiting, or  drowsiness.  You have swelling around your face.  You have vision problems.  You have a stiff neck.  You have difficulty breathing. MAKE SURE YOU:   Understand these instructions.  Will watch your condition.  Will get help right away if you are not doing well or get worse. Document Released: 10/26/2005 Document Revised: 03/12/2014 Document Reviewed: 11/10/2011 [facility] Patient Information 2015 [place], [place]. This information is not intended to replace advice given to you by your health care provider. Make sure you discuss any questions you have with your health care provider.

## 2015-06-24 ENCOUNTER — Telehealth: Payer: Self-pay | Admitting: Family Medicine

## 2015-06-24 NOTE — Telephone Encounter (Signed)
l °

## 2015-07-01 ENCOUNTER — Encounter: Payer: Self-pay | Admitting: Pulmonary Disease

## 2015-07-01 ENCOUNTER — Ambulatory Visit (INDEPENDENT_AMBULATORY_CARE_PROVIDER_SITE_OTHER): Payer: Managed Care, Other (non HMO) | Admitting: Pulmonary Disease

## 2015-07-01 VITALS — BP 120/82 | HR 80 | Ht 66.0 in | Wt 167.0 lb

## 2015-07-01 DIAGNOSIS — G4733 Obstructive sleep apnea (adult) (pediatric): Secondary | ICD-10-CM | POA: Diagnosis not present

## 2015-07-01 NOTE — Progress Notes (Signed)
Subjective:    [name], female    DOB: 1956/10/03, 58 y.o.   MRN: [id number]  HPI  59 year old with congestive heart failure referred for evaluation of excessive daytime fatigue and somnolence. 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 She reports excessive somnolence including episodes while driving, she has fallen asleep within 30 minutes at a movie. A home sleep test 3 years ago apparently showed OSA and she was placed on autoCPAP. CPAP download 04/2015 showed average pressure of 6 cm, poor usage-infectious she uses this only for one night, no residuals. DME is [facility] Due to excessive fatigue, her Coreg was dropped to 12.5 mg twice a day by herCardiologist and an AICD is being considered. Her EF is 30%  Bedtime is as early as 7 PM, sleep latency is variable, reports 2-3 nocturnal awakenings, and is out of bed by 5 AM feeling tired and drowsy. She's lost about 20 pounds to her current weight of 165 pounds   Past Medical History  Diagnosis Date  . CHF (congestive heart failure)   . Bowel obstruction 11/2014  . GERD (gastroesophageal reflux disease)   . Arthritis     knees, hands  . Hyperlipidemia   . Hypertension   . Depression   . Stroke     TIAs  . Congestive heart failure 02/24/2015    S/p pacemaker  . Obstructive sleep apnea 03/31/2015    Past Surgical History  Procedure Laterality Date  . Pacemaker insertion  08/2014  . Cholecystectomy    . Tubal ligation    . Knee surgery      right, repair torn torn cartialage  . Abdominal hysterectomy      menorraghia, 2006, total  . Tonsillectomy     Allergies  Allergen Reactions  . Codeine Nausea And Vomiting    Social History   Social History  . Marital Status: Married    Spouse Name: N/A  . Number of Children: N/A  . Years of Education: N/A   Occupational History  . [name].   Social History Main Topics  .  Smoking status: Never Smoker   . Smokeless tobacco: [name]  . Alcohol Use: No  . Drug Use: No  . Sexual Activity: No     Comment: Lives alone, no dietary restrictions   Other Topics Concern  . [name]   Social History Narrative    Family History  Problem Relation Age of Onset  . Diabetes Mother   . Hypertension Mother   . Heart disease Mother     s/p 1 stent  . Hyperlipidemia Mother   . Arthritis Mother   . Cancer Father     COLON  . Colon cancer Father 17  . Irritable bowel syndrome Sister   . Hyperlipidemia Daughter   . Hypertension Daughter   . Hypertension Maternal Grandmother   . Arthritis Maternal Grandmother   . Heart disease Maternal Grandfather     MI  . Hypertension Maternal Grandfather   . Arthritis Maternal Grandfather   . Hypertension Son      Review of Systems  Constitutional: Negative for fever, chills and unexpected weight change.  HENT: Negative for congestion, dental problem, ear pain, nosebleeds, postnasal drip, rhinorrhea, sinus pressure, sneezing, sore throat, trouble swallowing and voice change.   Eyes: Negative for visual disturbance.  Respiratory: Negative for cough, choking and shortness  of breath.   Cardiovascular: Negative for chest pain and leg swelling.  Gastrointestinal: Negative for vomiting, abdominal pain and diarrhea.  Genitourinary: Negative for difficulty urinating.  Musculoskeletal: Negative for arthralgias.  Skin: Negative for rash.  Neurological: Negative for tremors, syncope and headaches.  Hematological: Does not bruise/bleed easily.       Objective:   Physical Exam  Gen. Pleasant, well-nourished, in no distress, normal affect ENT - no lesions, no post nasal drip Neck: No JVD, no thyromegaly, no carotid bruits Lungs: no use of accessory muscles, no dullness to percussion, clear without rales or rhonchi  Cardiovascular: Rhythm regular, heart sounds  normal, no murmurs or gallops, no peripheral edema Abdomen:  soft and non-tender, no hepatosplenomegaly, BS normal. Musculoskeletal: No deformities, no cyanosis or clubbing Neuro:  alert, non focal        Assessment & Plan:

## 2015-07-01 NOTE — Patient Instructions (Signed)
You are on auto CPAP settings We will obtain report from [facility] CPAP compliance with goal of at least 6 hrs every night is the expectation. Cautioned against driving when sleepy - understanding that sleepiness will vary on a day to day basis

## 2015-07-01 NOTE — Assessment & Plan Note (Signed)
He will try to obtain a copy of her prior sleep study to review. Her excessive daytime somnolence may be related to untreated OSA-she does not seem to be compliant with her CPAP, or it may be related to CHF or medications for this such as coreg.  CPAP compliance with goal of at least 6 hrs every night is the expectation. Cautioned against driving when sleepy - understanding that sleepiness will vary on a day to day basis

## 2015-07-07 DIAGNOSIS — J329 Chronic sinusitis, unspecified: Secondary | ICD-10-CM | POA: Insufficient documentation

## 2015-07-07 DIAGNOSIS — R109 Unspecified abdominal pain: Secondary | ICD-10-CM | POA: Insufficient documentation

## 2015-07-07 NOTE — Assessment & Plan Note (Signed)
Well controlled, no changes to meds. Encouraged heart healthy diet such as the DASH diet and exercise as tolerated.  °

## 2015-07-07 NOTE — Assessment & Plan Note (Signed)
Contributing to fatigue, has been referred to pulmonology for further management

## 2015-07-07 NOTE — Assessment & Plan Note (Addendum)
Mild, intermittent. Avoid offending foods, daily probiotics and report if worsens or persists. May be mild constipation with colon spasam, may try hyoscyamine.

## 2015-07-07 NOTE — Assessment & Plan Note (Signed)
Avoid offending foods, start probiotics. Do not eat large meals in late evening and consider raising head of bed.  

## 2015-07-07 NOTE — Progress Notes (Signed)
Subjective:    [name], female    DOB: [birth year], 59 y.o.   MRN: [id number]  Chief Complaint  Patient presents with  . Follow-up    no energy    HPI Patient is in today for follow-up. Continues to  Has been waking up with HA. Notes some ongoing head congestion and ear pressure but no PND or fevers. No chest congestion. Denies CP/palp/SOB/GI or GU c/o. Taking meds as prescribed  Past Medical History  Diagnosis Date  . CHF (congestive heart failure)   . Bowel obstruction 11/2014  . GERD (gastroesophageal reflux disease)   . Arthritis     knees, hands  . Hyperlipidemia   . Hypertension   . Depression   . Stroke     TIAs  . Congestive heart failure 02/24/2015    S/p pacemaker  . Obstructive sleep apnea 03/31/2015    Past Surgical History  Procedure Laterality Date  . Pacemaker insertion  08/2014  . Cholecystectomy    . Tubal ligation    . Knee surgery      right, repair torn torn cartialage  . Abdominal hysterectomy      menorraghia, 2006, total  . Tonsillectomy      Family History  Problem Relation Age of Onset  . Diabetes Mother   . Hypertension Mother   . Heart disease Mother     s/p 1 stent  . Hyperlipidemia Mother   . Arthritis Mother   . Cancer Father     COLON  . Colon cancer Father 37  . Irritable bowel syndrome Sister   . Hyperlipidemia Daughter   . Hypertension Daughter   . Hypertension Maternal Grandmother   . Arthritis Maternal Grandmother   . Heart disease Maternal Grandfather     MI  . Hypertension Maternal Grandfather   . Arthritis Maternal Grandfather   . Hypertension Son     Social History   Social History  . Marital Status: Married    Spouse Name: N/A  . Number of Children: N/A  . Years of Education: N/A   Occupational History  . [name].   Social History Main Topics  . Smoking status: Never Smoker   . Smokeless tobacco: [name]  . Alcohol Use: No  . Drug Use: No  . Sexual Activity: No   Comment: Lives alone, no dietary restrictions   Other Topics Concern  . [name]   Social History Narrative    Outpatient Prescriptions Prior to Visit  Medication Sig Dispense Refill  . amLODipine (NORVASC) 5 MG tablet Take 1 tablet (5 mg total) by mouth daily. 90 tablet 2  . benazepril (LOTENSIN) 20 MG tablet Take 1 tablet (20 mg total) by mouth 2 (two) times daily. 180 tablet 1  . carvedilol (COREG) 12.5 MG tablet Take 1 tablet (12.5 mg total) by mouth 2 (two) times daily with a meal. 60 tablet 3  . docusate sodium (COLACE) 50 MG capsule Take 50 mg by mouth 2 (two) times daily.    Marland Kitchen escitalopram (LEXAPRO) 20 MG tablet Take 1 tablet (20 mg total) by mouth daily. 30 tablet 3  . furosemide (LASIX) 20 MG tablet Take 20 mg by mouth.    . ondansetron (ZOFRAN-ODT) 4 MG disintegrating tablet Take 4 mg by mouth 3 (three) times daily as needed for nausea or vomiting.    . pantoprazole (PROTONIX) 40 MG tablet Take 1 tablet (40 mg total) by mouth 2 (two) times daily. [place]  tablet 3  . potassium chloride SA (K-DUR,KLOR-CON) 20 MEQ tablet Take 1 tablet (20 mEq total) by mouth daily. 30 tablet 3  . promethazine (PHENERGAN) 25 MG tablet Take 1 tablet (25 mg total) by mouth every 6 (six) hours as needed for nausea or vomiting. 30 tablet 1  . sucralfate (CARAFATE) 1 G tablet Take 1 g by mouth 4 (four) times daily.     No facility-administered medications prior to visit.    Allergies  Allergen Reactions  . Codeine Nausea And Vomiting    Review of Systems  Constitutional: Negative for fever and malaise/fatigue.  HENT: Negative for congestion.   Eyes: Negative for discharge.  Respiratory: Negative for shortness of breath.   Cardiovascular: Negative for chest pain, palpitations and leg swelling.  Gastrointestinal: Negative for nausea and abdominal pain.  Genitourinary: Negative for dysuria.  Musculoskeletal: Negative for falls.  Skin: Negative for rash.  Neurological: Negative for loss of  consciousness and headaches.  Endo/Heme/Allergies: Negative for environmental allergies.  Psychiatric/Behavioral: Negative for depression. The patient is not nervous/anxious.        Objective:    Physical Exam  Constitutional: She is oriented to person, place, and time. She appears well-developed and well-nourished. No distress.  HENT:  Head: Normocephalic and atraumatic.  Right Ear: External ear normal.  Left Ear: External ear normal.  Nose: Nose normal.  TMs dull and mildly retracted  Eyes: Conjunctivae are normal. Right eye exhibits no discharge. Left eye exhibits no discharge.  Neck: Normal range of motion. Neck supple.  Cardiovascular: Normal rate and regular rhythm.   No murmur heard. Pulmonary/Chest: Effort normal and breath sounds normal.  Abdominal: Soft. Bowel sounds are normal. There is no tenderness.  Musculoskeletal: She exhibits no edema.  Neurological: She is alert and oriented to person, place, and time.  Skin: Skin is warm and dry.  Psychiatric: She has a normal mood and affect.  Nursing note and vitals reviewed.   BP 122/82 mmHg  Pulse 84  Temp(Src) 98.3 F (36.8 C) (Oral)  Ht 5\' 6"  (1.676 m)  Wt 165 lb 2 oz (74.9 kg)  BMI 26.66 kg/m2  SpO2 99% Wt Readings from Last 3 Encounters:  07/01/15 167 lb (75.751 kg)  06/20/15 165 lb 2 oz (74.9 kg)  03/19/15 163 lb 4 oz (74.05 kg)     Lab Results  Component Value Date   WBC 4.0 02/21/2015   HGB 12.3 02/21/2015   HCT 36.7 02/21/2015   PLT 166.0 02/21/2015   GLUCOSE 99 03/20/2015   ALT 22 03/20/2015   AST 40* 03/20/2015   NA 140 03/20/2015   K 4.2 03/20/2015   CL 106 03/20/2015   CREATININE 1.36* 03/20/2015   BUN 16 03/20/2015   CO2 29 03/20/2015   TSH 0.77 02/21/2015    Lab Results  Component Value Date   TSH 0.77 02/21/2015   Lab Results  Component Value Date   WBC 4.0 02/21/2015   HGB 12.3 02/21/2015   HCT 36.7 02/21/2015   MCV 88.0 02/21/2015   PLT 166.0 02/21/2015   Lab Results    Component Value Date   NA 140 03/20/2015   K 4.2 03/20/2015   CO2 29 03/20/2015   GLUCOSE 99 03/20/2015   BUN 16 03/20/2015   CREATININE 1.36* 03/20/2015   BILITOT 0.4 03/20/2015   ALKPHOS 40 03/20/2015   AST 40* 03/20/2015   ALT 22 03/20/2015   PROT 7.8 03/20/2015   ALBUMIN 3.7 03/20/2015   CALCIUM 9.6 03/20/2015  GFR 50.87* 03/20/2015   No results found for: CHOL No results found for: HDL No results found for: LDLCALC No results found for: TRIG No results found for: CHOLHDL No results found for: HGBA1C     Assessment & Plan:   Problem List Items Addressed This Visit    Sinusitis - Primary    Encouraged increased rest and hydration, add probiotics, zinc such as Coldeze or Xicam. Treat fevers as needed. Consider antiotics if no improvement      Relevant Medications   hyoscyamine (LEVSIN SL) 0.125 MG SL tablet   Obstructive sleep apnea    Contributing to fatigue, has been referred to pulmonology for further management      Hypertension    Well controlled, no changes to meds. Encouraged heart healthy diet such as the DASH diet and exercise as tolerated.       GERD (gastroesophageal reflux disease)    Avoid offending foods, start probiotics. Do not eat large meals in late evening and consider raising head of bed.       Relevant Medications   hyoscyamine (LEVSIN SL) 0.125 MG SL tablet   AP (abdominal pain)    Mild, intermittent. Avoid offending foods, daily probiotics and report if worsens or persists. May be mild constipation with colon spasam, may try hyoscyamine.      Relevant Medications   hyoscyamine (LEVSIN SL) 0.125 MG SL tablet      I am having Ms. Weissberg start on modafinil and hyoscyamine. I am also having her maintain her furosemide, docusate sodium, carvedilol, escitalopram, ondansetron, sucralfate, amLODipine, benazepril, pantoprazole, potassium chloride SA, and promethazine.  Meds ordered this encounter  Medications  . modafinil (PROVIGIL) 100 MG  tablet    Sig: Take 1 tablet (100 mg total) by mouth daily.    Dispense:  30 tablet    Refill:  1  . hyoscyamine (LEVSIN SL) 0.125 MG SL tablet    Sig: Place 1 tablet (0.125 mg total) under the tongue every 4 (four) hours as needed.    Dispense:  30 tablet    Refill:  0  . DISCONTD: amoxicillin-clavulanate (AUGMENTIN) 875-125 MG per tablet    Sig: Take 1 tablet by mouth 2 (two) times daily.    Dispense:  20 tablet    Refill:  0     Penni Homans, MD

## 2015-07-07 NOTE — Assessment & Plan Note (Signed)
Encouraged increased rest and hydration, add probiotics, zinc such as Coldeze or Xicam. Treat fevers as needed. Consider antiotics if no improvement

## 2015-07-31 ENCOUNTER — Ambulatory Visit (INDEPENDENT_AMBULATORY_CARE_PROVIDER_SITE_OTHER): Payer: Managed Care, Other (non HMO) | Admitting: Medical

## 2015-07-31 ENCOUNTER — Encounter: Payer: Self-pay | Admitting: Medical

## 2015-07-31 VITALS — BP 120/90 | HR 94 | Temp 97.8°F | Ht 66.0 in | Wt 168.2 lb

## 2015-07-31 DIAGNOSIS — J0100 Acute maxillary sinusitis, unspecified: Secondary | ICD-10-CM

## 2015-07-31 DIAGNOSIS — R42 Dizziness and giddiness: Secondary | ICD-10-CM

## 2015-07-31 MED ORDER — CEFDINIR 300 MG PO CAPS: 300.0000 mg | ORAL_CAPSULE | Freq: Two times a day (BID) | ORAL | Status: DC

## 2015-07-31 MED ORDER — BENZONATATE 100 MG PO CAPS: 100.0000 mg | ORAL_CAPSULE | Freq: Three times a day (TID) | ORAL | Status: DC | PRN

## 2015-07-31 MED ORDER — MECLIZINE HCL 12.5 MG PO TABS: 12.5000 mg | ORAL_TABLET | Freq: Three times a day (TID) | ORAL | Status: DC | PRN

## 2015-07-31 MED ORDER — FLUTICASONE PROPIONATE 50 MCG/ACT NA SUSP: 2.0000 | Freq: Every day | NASAL | Status: DC

## 2015-07-31 NOTE — Progress Notes (Signed)
Subjective:    [name], female    DOB: Apr 13, 1956, 59 y.o.   MRN: [id number]  HPI   Pt in for 3 wks or cough, congestion and runny nose. But last week she got worse. Pt had sinus pressure for one week. Pt has some teeth sensitivity. Pt feverish last couple of days on and off. Temp has been 101.1. Sweating little. Some ear pain and st. Some sneezing.  With this above illness/sinus pressure she has felt dizzy.  Pt states in past augmentin upset her stomach and did not help at all.    Review of Systems  Constitutional: Positive for fever. Negative for chills and fatigue.  HENT: Positive for congestion, rhinorrhea, sinus pressure, sneezing and sore throat. Negative for postnasal drip.   Respiratory: Positive for cough. Negative for chest tightness, shortness of breath and wheezing.   Cardiovascular: Negative for chest pain and palpitations.  Gastrointestinal: Negative for abdominal pain.  Musculoskeletal: Negative for back pain.  Neurological: Positive for dizziness. Negative for tremors, syncope, speech difficulty, weakness, numbness and headaches.  Hematological: Negative for adenopathy. Does not bruise/bleed easily.  Psychiatric/Behavioral: Negative for behavioral problems and confusion.    Past Medical History  Diagnosis Date  . CHF (congestive heart failure)   . Bowel obstruction 11/2014  . GERD (gastroesophageal reflux disease)   . Arthritis     knees, hands  . Hyperlipidemia   . Hypertension   . Depression   . Stroke     TIAs  . Congestive heart failure 02/24/2015    S/p pacemaker  . Obstructive sleep apnea 03/31/2015    Social History   Social History  . Marital Status: Married    Spouse Name: N/A  . Number of Children: N/A  . Years of Education: N/A   Occupational History  . [name].   Social History Main Topics  . Smoking status: Never Smoker   . Smokeless tobacco: [name]  . Alcohol Use: No  . Drug Use: No  . Sexual Activity:  No     Comment: Lives alone, no dietary restrictions   Other Topics Concern  . [name]   Social History Narrative    Past Surgical History  Procedure Laterality Date  . Pacemaker insertion  08/2014  . Cholecystectomy    . Tubal ligation    . Knee surgery      right, repair torn torn cartialage  . Abdominal hysterectomy      menorraghia, 2006, total  . Tonsillectomy      Family History  Problem Relation Age of Onset  . Diabetes Mother   . Hypertension Mother   . Heart disease Mother     s/p 1 stent  . Hyperlipidemia Mother   . Arthritis Mother   . Cancer Father     COLON  . Colon cancer Father 78  . Irritable bowel syndrome Sister   . Hyperlipidemia Daughter   . Hypertension Daughter   . Hypertension Maternal Grandmother   . Arthritis Maternal Grandmother   . Heart disease Maternal Grandfather     MI  . Hypertension Maternal Grandfather   . Arthritis Maternal Grandfather   . Hypertension Son     Allergies  Allergen Reactions  . Codeine Nausea And Vomiting    Current Outpatient Prescriptions on File Prior to Visit  Medication Sig Dispense Refill  . amLODipine (NORVASC) 5 MG tablet Take 1 tablet (5 mg total) by mouth daily. 90 tablet 2  .  benazepril (LOTENSIN) 20 MG tablet Take 1 tablet (20 mg total) by mouth 2 (two) times daily. 180 tablet 1  . carvedilol (COREG) 12.5 MG tablet Take 1 tablet (12.5 mg total) by mouth 2 (two) times daily with a meal. 60 tablet 3  . Cholecalciferol (VITAMIN D3) 5000 UNITS CAPS Take 1 capsule by mouth daily.    Marland Kitchen docusate sodium (COLACE) 50 MG capsule Take 50 mg by mouth 2 (two) times daily.    Marland Kitchen escitalopram (LEXAPRO) 20 MG tablet Take 1 tablet (20 mg total) by mouth daily. 30 tablet 3  . furosemide (LASIX) 20 MG tablet Take 20 mg by mouth.    . hyoscyamine (LEVSIN SL) 0.125 MG SL tablet Place 1 tablet (0.125 mg total) under the tongue every 4 (four) hours as needed. 30 tablet 0  . modafinil (PROVIGIL) 100 MG tablet Take 1  tablet (100 mg total) by mouth daily. 30 tablet 1  . Multiple Vitamins-Minerals (CENTRUM SILVER ADULT 50+ PO) Take by mouth.    . ondansetron (ZOFRAN-ODT) 4 MG disintegrating tablet Take 4 mg by mouth 3 (three) times daily as needed for nausea or vomiting.    . pantoprazole (PROTONIX) 40 MG tablet Take 1 tablet (40 mg total) by mouth 2 (two) times daily. 60 tablet 3  . potassium chloride SA (K-DUR,KLOR-CON) 20 MEQ tablet Take 1 tablet (20 mEq total) by mouth daily. 30 tablet 3  . promethazine (PHENERGAN) 25 MG tablet Take 1 tablet (25 mg total) by mouth every 6 (six) hours as needed for nausea or vomiting. 30 tablet 1  . sucralfate (CARAFATE) 1 G tablet Take 1 g by mouth 4 (four) times daily.     No current facility-administered medications on file prior to visit.    BP 120/90 mmHg  Pulse 94  Temp(Src) 97.8 F (36.6 C) (Oral)  Ht 5\' 6"  (1.676 m)  Wt 168 lb 3.2 oz (76.295 kg)  BMI 27.16 kg/m2  SpO2 98%      Objective:   Physical Exam  General  Mental Status - Alert. General Appearance - Well groomed. Not in acute distress.  Skin Rashes- No Rashes.  HEENT Head- Normal. Ear Auditory Canal - Left- Normal. Right - Normal.Tympanic Membrane- Left- Normal. Right- Normal. Eye Sclera/Conjunctiva- Left- Normal. Right- Normal. Nose & Sinuses Nasal Mucosa- Left-  Boggy and Congested. Right-  Boggy and  Congested.Bilateral maxillary and frontal sinus pressure. Mouth & Throat Lips: Upper Lip- Normal: no dryness, cracking, pallor, cyanosis, or vesicular eruption. Lower Lip-Normal: no dryness, cracking, pallor, cyanosis or vesicular eruption. Buccal Mucosa- Bilateral- No Aphthous ulcers. Oropharynx- No Discharge or Erythema. Tonsils: Characteristics- Bilateral- No Erythema or Congestion. Size/Enlargement- Bilateral- No enlargement. Discharge- bilateral-None.  Neck Neck- Supple. No Masses.   Chest and Lung Exam Auscultation: Breath Sounds:-Clear even and  unlabored.  Cardiovascular Auscultation:Rythm- Regular, rate and rhythm. Murmurs & Other Heart Sounds:Ausculatation of the heart reveal- No Murmurs.  Lymphatic Head & Neck General Head & Neck Lymphatics: Bilateral: Description- No Localized lymphadenopathy.   Neurologic Cranial Nerve exam:- CN III-XII intact(No nystagmus), symmetric smile. Finger to Nose:- Normal/Intact Strength:- 5/5 equal and symmetric strength both upper and lower extremities.       Assessment & Plan:  Your appear to have a sinus infection. I am prescribing antibiotic cefdnir for the infection. To help with the nasal congestion I prescribed  flonase nasal steroid. For your associated cough, I prescribed cough medicine benzonatate.  Rest, hydrate, tylenol for fever.  Follow up in 7 days or as  needed.  Meclizine for dizziness that is persistent. If dizziness with neurologic signs or symptoms then ED evaluation.

## 2015-07-31 NOTE — Progress Notes (Signed)
Pre visit review using our clinic review tool, if applicable. No additional management support is needed unless otherwise documented below in the visit note. 

## 2015-07-31 NOTE — Patient Instructions (Addendum)
Your appear to have a sinus infection. I am prescribing antibiotic cefdnir for the infection. To help with the nasal congestion I prescribed  flonase nasal steroid. For your associated cough, I prescribed cough medicine benzonatate.  Rest, hydrate, tylenol for fever.  Follow up in 7 days or as needed.  Meclizine for dizziness that is persistent. If dizziness with neurologic signs or symptoms then ED evaluation.

## 2015-08-15 ENCOUNTER — Ambulatory Visit: Payer: PRIVATE HEALTH INSURANCE | Admitting: Adult Health

## 2015-08-22 ENCOUNTER — Ambulatory Visit: Payer: PRIVATE HEALTH INSURANCE | Admitting: Adult Health

## 2015-08-29 ENCOUNTER — Ambulatory Visit: Payer: PRIVATE HEALTH INSURANCE | Admitting: Adult Health

## 2015-09-05 ENCOUNTER — Ambulatory Visit (INDEPENDENT_AMBULATORY_CARE_PROVIDER_SITE_OTHER): Payer: Managed Care, Other (non HMO) | Admitting: Adult Health

## 2015-09-05 ENCOUNTER — Encounter: Payer: Self-pay | Admitting: Adult Health

## 2015-09-05 ENCOUNTER — Encounter: Payer: Self-pay | Admitting: *Deleted

## 2015-09-05 VITALS — BP 138/78 | HR 93 | Temp 97.6°F | Ht 66.0 in | Wt 169.0 lb

## 2015-09-05 DIAGNOSIS — G4733 Obstructive sleep apnea (adult) (pediatric): Secondary | ICD-10-CM

## 2015-09-05 NOTE — Patient Instructions (Signed)
Continue on CPAP At bedtime   We will obtain report from [facility] CPAP compliance with goal of at least 6 hrs every night is the expectation. Cautioned against driving when sleepy - understanding that sleepiness will vary on a day to day basis follow up Dr. Elsworth Soho  In 6 months and As needed

## 2015-09-05 NOTE — Progress Notes (Signed)
   Subjective:    [name], female    DOB: 04-23-1956, 59 y.o.   MRN: [id number]  HPI  59 year old female with a known history of congestive heart failure was seen 2 months ago for evaluation of  Sleep apnea   09/05/2015   Follow-up sleep apnea  Patient returns for a two-month follow-up for sleep apnea. Says she is compliant with CPAP , wears most nights for 3-4 hr .  Does take it off in middle of night . We discussed not taking it off or placing back on when she gets up to go back to [place] .  Recent death in family , very stressed.  We discussed the dangers of untreated OSA.  Download was requested.    Review of Systems Constitutional:   No  weight loss, night sweats,  Fevers, chills,  +fatigue, or  lassitude.  HEENT:   No headaches,  Difficulty swallowing,  Tooth/dental problems, or  Sore throat,                No sneezing, itching, ear ache, nasal congestion, post nasal drip,   CV:  No chest pain,  Orthopnea, PND, swelling in lower extremities, anasarca, dizziness, palpitations, syncope.   GI  No heartburn, indigestion, abdominal pain, nausea, vomiting, diarrhea, change in bowel habits, loss of appetite, bloody stools.   Resp:  No chest wall deformity  Skin: no rash or lesions.  GU: no dysuria, change in color of urine, no urgency or frequency.  No flank pain, no hematuria   MS:  No joint pain or swelling.  No decreased range of motion.  No back pain.  Psych:  No change in mood or affect. No depression or anxiety.  No memory loss.         Objective:   Physical Exam  GEN: A/Ox3; pleasant , NAD, well nourished   HEENT:  [place]/AT,  EACs-clear, TMs-wnl, NOSE-clear, THROAT-clear, no lesions, no postnasal drip or exudate noted. Class 2 MP airway   NECK:  Supple w/ fair ROM; no JVD; normal carotid impulses w/o bruits; no thyromegaly or nodules palpated; no lymphadenopathy.  RESP  Clear  P & A; w/o, wheezes/ rales/ or rhonchi.no accessory muscle use, no  dullness to percussion  CARD:  RRR, no m/r/g  , no peripheral edema, pulses intact, no cyanosis or clubbing.  GI:   Soft & nt; nml bowel sounds; no organomegaly or masses detected.  Musco: Warm bil, no deformities or joint swelling noted.   Neuro: alert, no focal deficits noted.    Skin: Warm, no lesions or rashes        Assessment & Plan:

## 2015-09-05 NOTE — Assessment & Plan Note (Signed)
Encouraged on CPAP compliance  Download requested   Plan  Continue on CPAP At bedtime   We will obtain report from [facility] CPAP compliance with goal of at least 6 hrs every night is the expectation. Cautioned against driving when sleepy - understanding that sleepiness will vary on a day to day basis follow up Dr. Elsworth Soho  In 6 months and As needed

## 2015-09-05 NOTE — Progress Notes (Signed)
Reviewed & agree with plan  

## 2015-10-01 ENCOUNTER — Telehealth: Payer: Self-pay | Admitting: Family Medicine

## 2015-10-01 MED ORDER — POTASSIUM CHLORIDE CRYS ER 20 MEQ PO TBCR: 20.0000 meq | EXTENDED_RELEASE_TABLET | Freq: Every day | ORAL | Status: DC

## 2015-10-01 NOTE — Telephone Encounter (Signed)
°  Relation to [id number] Call back [phone] Pharmacy:[email or web address]  Reason for call: pt is needed rx   potassium chloride SA (K-DUR,KLOR-CON) 20 MEQ tablet      Pt states she is completely out of her meds and is leaving to go out of town this evening at 6:00pm would like to know if dr. Charlett Blake can call in in prior to her leaving, pt states she has been without her meds for 2 wks

## 2015-10-01 NOTE — Telephone Encounter (Signed)
Prescription sent in as requested. Called the patient left a detailed message potassium sent in.

## 2015-10-11 ENCOUNTER — Encounter: Payer: Self-pay | Admitting: Family Medicine

## 2015-10-11 ENCOUNTER — Other Ambulatory Visit (HOSPITAL_COMMUNITY)
Admission: RE | Admit: 2015-10-11 | Discharge: 2015-10-11 | Disposition: A | Discharge status: Home / Self Care | Payer: PRIVATE HEALTH INSURANCE | Source: Ambulatory Visit | Attending: Family Medicine | Admitting: Family Medicine

## 2015-10-11 ENCOUNTER — Ambulatory Visit (INDEPENDENT_AMBULATORY_CARE_PROVIDER_SITE_OTHER): Payer: Managed Care, Other (non HMO) | Admitting: Family Medicine

## 2015-10-11 VITALS — BP 128/86 | HR 81 | Temp 97.9°F | Ht 66.0 in | Wt 171.0 lb

## 2015-10-11 DIAGNOSIS — J329 Chronic sinusitis, unspecified: Secondary | ICD-10-CM

## 2015-10-11 DIAGNOSIS — I10 Essential (primary) hypertension: Secondary | ICD-10-CM | POA: Diagnosis not present

## 2015-10-11 DIAGNOSIS — E785 Hyperlipidemia, unspecified: Secondary | ICD-10-CM | POA: Diagnosis not present

## 2015-10-11 DIAGNOSIS — K2100 Gastro-esophageal reflux disease with esophagitis, without bleeding: Secondary | ICD-10-CM

## 2015-10-11 DIAGNOSIS — I509 Heart failure, unspecified: Secondary | ICD-10-CM

## 2015-10-11 DIAGNOSIS — Z01419 Encounter for gynecological examination (general) (routine) without abnormal findings: Secondary | ICD-10-CM | POA: Insufficient documentation

## 2015-10-11 DIAGNOSIS — N760 Acute vaginitis: Secondary | ICD-10-CM | POA: Insufficient documentation

## 2015-10-11 DIAGNOSIS — Z780 Asymptomatic menopausal state: Secondary | ICD-10-CM

## 2015-10-11 DIAGNOSIS — Z23 Encounter for immunization: Secondary | ICD-10-CM | POA: Diagnosis not present

## 2015-10-11 DIAGNOSIS — R300 Dysuria: Secondary | ICD-10-CM

## 2015-10-11 DIAGNOSIS — R109 Unspecified abdominal pain: Secondary | ICD-10-CM

## 2015-10-11 DIAGNOSIS — Z1239 Encounter for other screening for malignant neoplasm of breast: Secondary | ICD-10-CM

## 2015-10-11 DIAGNOSIS — Z124 Encounter for screening for malignant neoplasm of cervix: Secondary | ICD-10-CM

## 2015-10-11 DIAGNOSIS — Z113 Encounter for screening for infections with a predominantly sexual mode of transmission: Secondary | ICD-10-CM | POA: Insufficient documentation

## 2015-10-11 LAB — COMPREHENSIVE METABOLIC PANEL
ALBUMIN: 3.8 g/dL (ref 3.5–5.2)
ALT: 12 U/L (ref 0–35)
AST: 16 U/L (ref 0–37)
Alkaline Phosphatase: 52 U/L (ref 39–117)
BILIRUBIN TOTAL: 0.7 mg/dL (ref 0.2–1.2)
BUN: 16 mg/dL (ref 6–23)
CALCIUM: 9.6 mg/dL (ref 8.4–10.5)
CO2: 28 mEq/L (ref 19–32)
CREATININE: 0.69 mg/dL (ref 0.40–1.20)
Chloride: 105 mEq/L (ref 96–112)
GFR: 111.1 mL/min (ref >60.00)
GLUCOSE: 88 mg/dL (ref 70–99)
Potassium: 3.9 mEq/L (ref 3.5–5.1)
Sodium: 141 mEq/L (ref 135–145)
Total Protein: 8.9 g/dL — ABNORMAL HIGH (ref 6.0–8.3)

## 2015-10-11 LAB — POCT URINALYSIS DIPSTICK
BILIRUBIN UA: NEGATIVE
GLUCOSE UA: NEGATIVE
Ketones, UA: NEGATIVE
Leukocytes, UA: NEGATIVE
Nitrite, UA: NEGATIVE
Protein, UA: NEGATIVE
RBC UA: NEGATIVE
Urobilinogen, UA: 2
pH, UA: 6

## 2015-10-11 LAB — CBC
HCT: 39.9 % (ref 36.0–46.0)
Hemoglobin: 12.9 g/dL (ref 12.0–15.0)
MCHC: 32.4 g/dL (ref 30.0–36.0)
MCV: 89.9 fl (ref 78.0–100.0)
Platelets: 168 10*3/uL (ref 150.0–400.0)
RBC: 4.43 Mil/uL (ref 3.87–5.11)
RDW: 13.2 % (ref 11.5–15.5)
WBC: 3.9 10*3/uL — ABNORMAL LOW (ref 4.0–10.5)

## 2015-10-11 LAB — LIPID PANEL
CHOLESTEROL: 157 mg/dL (ref 0–200)
HDL: 54.2 mg/dL (ref >39.00)
LDL Cholesterol: 89 mg/dL (ref 0–99)
NonHDL: 102.73
TRIGLYCERIDES: 69 mg/dL (ref 0.0–149.0)
Total CHOL/HDL Ratio: 3
VLDL: 13.8 mg/dL (ref 0.0–40.0)

## 2015-10-11 LAB — TSH: TSH: 1.2 u[IU]/mL (ref 0.35–4.50)

## 2015-10-11 MED ORDER — CARVEDILOL 12.5 MG PO TABS: 12.5000 mg | ORAL_TABLET | Freq: Two times a day (BID) | ORAL | Status: DC

## 2015-10-11 MED ORDER — FUROSEMIDE 20 MG PO TABS: 20.0000 mg | ORAL_TABLET | Freq: Every day | ORAL | Status: DC

## 2015-10-11 MED ORDER — ONDANSETRON HCL 4 MG PO TABS: 4.0000 mg | ORAL_TABLET | Freq: Three times a day (TID) | ORAL | Status: DC | PRN

## 2015-10-11 MED ORDER — MODAFINIL 100 MG PO TABS: 100.0000 mg | ORAL_TABLET | Freq: Every day | ORAL | Status: DC

## 2015-10-11 MED ORDER — PROMETHAZINE HCL 25 MG PO TABS: 25.0000 mg | ORAL_TABLET | Freq: Four times a day (QID) | ORAL | Status: DC | PRN

## 2015-10-11 MED ORDER — POTASSIUM CHLORIDE CRYS ER 20 MEQ PO TBCR: 20.0000 meq | EXTENDED_RELEASE_TABLET | Freq: Every day | ORAL | Status: DC

## 2015-10-11 MED ORDER — HYOSCYAMINE SULFATE 0.125 MG SL SUBL: 0.1250 mg | SUBLINGUAL_TABLET | SUBLINGUAL | Status: DC | PRN

## 2015-10-11 MED ORDER — BENAZEPRIL HCL 20 MG PO TABS: 20.0000 mg | ORAL_TABLET | Freq: Two times a day (BID) | ORAL | Status: DC

## 2015-10-11 MED ORDER — FLUTICASONE PROPIONATE 50 MCG/ACT NA SUSP: 2.0000 | Freq: Every day | NASAL | Status: DC

## 2015-10-11 MED ORDER — ESCITALOPRAM OXALATE 20 MG PO TABS: 20.0000 mg | ORAL_TABLET | Freq: Every day | ORAL | Status: DC

## 2015-10-11 MED ORDER — MECLIZINE HCL 12.5 MG PO TABS: 12.5000 mg | ORAL_TABLET | Freq: Three times a day (TID) | ORAL | Status: DC | PRN

## 2015-10-11 MED ORDER — AMLODIPINE BESYLATE 5 MG PO TABS: 5.0000 mg | ORAL_TABLET | Freq: Every day | ORAL | Status: DC

## 2015-10-11 MED ORDER — FLUCONAZOLE 150 MG PO TABS: 150.0000 mg | ORAL_TABLET | Freq: Once | ORAL | Status: DC

## 2015-10-11 MED ORDER — SUCRALFATE 1 G PO TABS: 1.0000 g | ORAL_TABLET | Freq: Two times a day (BID) | ORAL | Status: DC

## 2015-10-11 MED ORDER — PANTOPRAZOLE SODIUM 40 MG PO TBEC: 40.0000 mg | DELAYED_RELEASE_TABLET | Freq: Two times a day (BID) | ORAL | Status: DC

## 2015-10-11 NOTE — Patient Instructions (Signed)
Hypertension Hypertension, commonly called high blood pressure, is when the force of blood pumping through your arteries is too strong. Your arteries are the blood vessels that carry blood from your heart throughout your body. A blood pressure reading consists of a higher number over a lower number, such as 110/72. The higher number (systolic) is the pressure inside your arteries when your heart pumps. The lower number (diastolic) is the pressure inside your arteries when your heart relaxes. Ideally you want your blood pressure below 120/80. Hypertension forces your heart to work harder to pump blood. Your arteries may become narrow or stiff. Having untreated or uncontrolled hypertension can cause heart attack, stroke, kidney disease, and other problems. RISK FACTORS Some risk factors for high blood pressure are controllable. Others are not.  Risk factors you cannot control include:   Race. You may be at higher risk if you are African American.  Age. Risk increases with age.  Gender. Men are at higher risk than women before age 45 years. After age 65, women are at higher risk than men. Risk factors you can control include:  Not getting enough exercise or physical activity.  Being overweight.  Getting too much fat, sugar, calories, or salt in your diet.  Drinking too much alcohol. SIGNS AND SYMPTOMS Hypertension does not usually cause signs or symptoms. Extremely high blood pressure (hypertensive crisis) may cause headache, anxiety, shortness of breath, and nosebleed. DIAGNOSIS To check if you have hypertension, your health care provider will measure your blood pressure while you are seated, with your arm held at the level of your heart. It should be measured at least twice using the same arm. Certain conditions can cause a difference in blood pressure between your right and left arms. A blood pressure reading that is higher than normal on one occasion does not mean that you need treatment. If  it is not clear whether you have high blood pressure, you may be asked to return on a different day to have your blood pressure checked again. Or, you may be asked to monitor your blood pressure at home for 1 or more weeks. TREATMENT Treating high blood pressure includes making lifestyle changes and possibly taking medicine. Living a healthy lifestyle can help lower high blood pressure. You may need to change some of your habits. Lifestyle changes may include:  Following the DASH diet. This diet is high in fruits, vegetables, and whole grains. It is low in salt, red meat, and added sugars.  Keep your sodium intake below 2,300 mg per day.  Getting at least 30-45 minutes of aerobic exercise at least 4 times per week.  Losing weight if necessary.  Not smoking.  Limiting alcoholic beverages.  Learning ways to reduce stress. Your health care provider may prescribe medicine if lifestyle changes are not enough to get your blood pressure under control, and if one of the following is true:  You are 18-59 years of age and your systolic blood pressure is above 140.  You are 60 years of age or older, and your systolic blood pressure is above 150.  Your diastolic blood pressure is above 90.  You have diabetes, and your systolic blood pressure is over 140 or your diastolic blood pressure is over 90.  You have kidney disease and your blood pressure is above 140/90.  You have heart disease and your blood pressure is above 140/90. Your personal target blood pressure may vary depending on your medical conditions, your age, and other factors. HOME CARE INSTRUCTIONS    Have your blood pressure rechecked as directed by your health care provider.   Take medicines only as directed by your health care provider. Follow the directions carefully. Blood pressure medicines must be taken as prescribed. The medicine does not work as well when you skip doses. Skipping doses also puts you at risk for  problems.  Do not smoke.   Monitor your blood pressure at home as directed by your health care provider. SEEK MEDICAL CARE IF:   You think you are having a reaction to medicines taken.  You have recurrent headaches or feel dizzy.  You have swelling in your ankles.  You have trouble with your vision. SEEK IMMEDIATE MEDICAL CARE IF:  You develop a severe headache or confusion.  You have unusual weakness, numbness, or feel faint.  You have severe chest or abdominal pain.  You vomit repeatedly.  You have trouble breathing. MAKE SURE YOU:   Understand these instructions.  Will watch your condition.  Will get help right away if you are not doing well or get worse.   This information is not intended to replace advice given to you by your health care provider. Make sure you discuss any questions you have with your health care provider.   Document Released: 10/26/2005 Document Revised: 03/12/2015 Document Reviewed: 08/18/2013 [employer] Interactive Patient Education 2016 [employer]  

## 2015-10-12 LAB — CULTURE, URINE COMPREHENSIVE
COLONY COUNT: NO GROWTH
ORGANISM ID, BACTERIA: NO GROWTH

## 2015-10-12 LAB — HEPATITIS C ANTIBODY: HCV AB: NEGATIVE

## 2015-10-13 DIAGNOSIS — R300 Dysuria: Secondary | ICD-10-CM | POA: Insufficient documentation

## 2015-10-13 NOTE — Progress Notes (Signed)
Subjective:    Patient ID: Mariaha [name], female    DOB: [birth year], 59 y.o.   MRN: [id number]  Chief Complaint  Patient presents with  . Follow-up    HPI Patient is in today for follow-up but is in the process of leaving her job due to her computed past medical history and applying for Social Security disability. She may be without insurance for a short time. No recent hospitalizations but she has been struggling with some sinus congestion and pain. She continues to struggle with fatigue and malaise as well as some shortness of breath with exertion. Symptoms are not escalating. Does note some mild dysuria and urinary frequency but denies urgency or incontinence. No fevers or chills. No ear pain or sore throat.   Past Medical History  Diagnosis Date  . CHF (congestive heart failure) ([place])   . Bowel obstruction ([place]) 11/2014  . GERD (gastroesophageal reflux disease)   . Arthritis     knees, hands  . Hyperlipidemia   . Hypertension   . Depression   . Stroke ([place])     TIAs  . Congestive heart failure ([place]) 02/24/2015    S/p pacemaker  . Obstructive sleep apnea 03/31/2015    Past Surgical History  Procedure Laterality Date  . Pacemaker insertion  08/2014  . Cholecystectomy    . Tubal ligation    . Knee surgery      right, repair torn torn cartialage  . Abdominal hysterectomy      menorraghia, 2006, total  . Tonsillectomy      Family History  Problem Relation Age of Onset  . Diabetes Mother   . Hypertension Mother   . Heart disease Mother     s/p 1 stent  . Hyperlipidemia Mother   . Arthritis Mother   . Cancer Father     COLON  . Colon cancer Father 4  . Irritable bowel syndrome Sister   . Hyperlipidemia Daughter   . Hypertension Daughter   . Hypertension Maternal Grandmother   . Arthritis Maternal Grandmother   . Heart disease Maternal Grandfather     MI  . Hypertension Maternal Grandfather   . Arthritis Maternal Grandfather   . Hypertension Son     Social  History   Social History  . Marital Status: Married    Spouse Name: N/A  . Number of Children: N/A  . Years of Education: N/A   Occupational History  . [name].   Social History Main Topics  . Smoking status: Never Smoker   . Smokeless tobacco: [name]  . Alcohol Use: No  . Drug Use: No  . Sexual Activity: No     Comment: Lives alone, no dietary restrictions   Other Topics Concern  . [name]   Social History Narrative    Outpatient Prescriptions Prior to Visit  Medication Sig Dispense Refill  . butalbital-acetaminophen-caffeine (FIORICET) 50-325-40 MG tablet Take by mouth.    . Cholecalciferol (VITAMIN D3) 5000 UNITS CAPS Take 1 capsule by mouth daily.    Marland Kitchen docusate sodium (COLACE) 50 MG capsule Take 50 mg by mouth 2 (two) times daily.    . Multiple Vitamins-Minerals (CENTRUM SILVER ADULT 50+ PO) Take by mouth.    Marland Kitchen amLODipine (NORVASC) 5 MG tablet Take 1 tablet (5 mg total) by mouth daily. 90 tablet 2  . benazepril (LOTENSIN) 20 MG tablet Take 1 tablet (20 mg total) by mouth 2 (two) times daily. 180 tablet 1  . benzonatate (  TESSALON) 100 MG capsule Take 1 capsule (100 mg total) by mouth 3 (three) times daily as needed. 21 capsule 0  . carvedilol (COREG) 12.5 MG tablet Take 1 tablet (12.5 mg total) by mouth 2 (two) times daily with a meal. 60 tablet 3  . escitalopram (LEXAPRO) 20 MG tablet Take 1 tablet (20 mg total) by mouth daily. 30 tablet 3  . fluticasone (FLONASE) 50 MCG/ACT nasal spray Place 2 sprays into both nostrils daily. 16 g 1  . furosemide (LASIX) 20 MG tablet Take 20 mg by mouth.    . hyoscyamine (LEVSIN SL) 0.125 MG SL tablet Place 1 tablet (0.125 mg total) under the tongue every 4 (four) hours as needed. 30 tablet 0  . meclizine (ANTIVERT) 12.5 MG tablet Take 1 tablet (12.5 mg total) by mouth 3 (three) times daily as needed for dizziness. 30 tablet 0  . modafinil (PROVIGIL) 100 MG tablet Take 1 tablet (100 mg total) by mouth daily. 30 tablet 1  .  ondansetron (ZOFRAN-ODT) 4 MG disintegrating tablet Take 4 mg by mouth 3 (three) times daily as needed for nausea or vomiting.    . pantoprazole (PROTONIX) 40 MG tablet Take 1 tablet (40 mg total) by mouth 2 (two) times daily. 60 tablet 3  . potassium chloride SA (K-DUR,KLOR-CON) 20 MEQ tablet Take 1 tablet (20 mEq total) by mouth daily. 30 tablet 3  . promethazine (PHENERGAN) 25 MG tablet Take 1 tablet (25 mg total) by mouth every 6 (six) hours as needed for nausea or vomiting. 30 tablet 1  . sucralfate (CARAFATE) 1 G tablet Take 1 g by mouth 4 (four) times daily.     No facility-administered medications prior to visit.    Allergies  Allergen Reactions  . Codeine Nausea And Vomiting    Review of Systems  Constitutional: Negative for fever and malaise/fatigue.  HENT: Negative for congestion.   Eyes: Negative for discharge.  Respiratory: Negative for shortness of breath.   Cardiovascular: Negative for chest pain, palpitations and leg swelling.  Gastrointestinal: Negative for nausea and abdominal pain.  Genitourinary: Negative for dysuria.  Musculoskeletal: Negative for falls.  Skin: Negative for rash.  Neurological: Negative for loss of consciousness and headaches.  Endo/Heme/Allergies: Negative for environmental allergies.  Psychiatric/Behavioral: Negative for depression. The patient is not nervous/anxious.        Objective:    Physical Exam  Constitutional: She is oriented to person, place, and time. She appears well-developed and well-nourished. No distress.  HENT:  Head: Normocephalic and atraumatic.  Nose: Nose normal.  Eyes: Right eye exhibits no discharge. Left eye exhibits no discharge.  Neck: Normal range of motion. Neck supple.  Cardiovascular: Normal rate and regular rhythm.   No murmur heard. Pulmonary/Chest: Effort normal and breath sounds normal.  Abdominal: Soft. Bowel sounds are normal. There is no tenderness.  Genitourinary: Uterus normal. Vaginal discharge  found.  No vulvar or vaginal lesions. No breast lesions, skin changes or discharge  Musculoskeletal: She exhibits no edema.  Neurological: She is alert and oriented to person, place, and time.  Skin: Skin is warm and dry.  Psychiatric: She has a normal mood and affect.  Nursing note and vitals reviewed.   BP 128/86 mmHg  Pulse 81  Temp(Src) 97.9 F (36.6 C) (Oral)  Ht 5\' 6"  (1.676 m)  Wt 171 lb (77.565 kg)  BMI 27.61 kg/m2  SpO2 95% Wt Readings from Last 3 Encounters:  10/11/15 171 lb (77.565 kg)  09/05/15 169 lb (76.658 kg)  07/31/15 168 lb 3.2 oz (76.295 kg)     Lab Results  Component Value Date   WBC 3.9* 10/11/2015   HGB 12.9 10/11/2015   HCT 39.9 10/11/2015   PLT 168.0 10/11/2015   GLUCOSE 88 10/11/2015   CHOL 157 10/11/2015   TRIG 69.0 10/11/2015   HDL 54.20 10/11/2015   LDLCALC 89 10/11/2015   ALT 12 10/11/2015   AST 16 10/11/2015   NA 141 10/11/2015   K 3.9 10/11/2015   CL 105 10/11/2015   CREATININE 0.69 10/11/2015   BUN 16 10/11/2015   CO2 28 10/11/2015   TSH 1.20 10/11/2015    Lab Results  Component Value Date   TSH 1.20 10/11/2015   Lab Results  Component Value Date   WBC 3.9* 10/11/2015   HGB 12.9 10/11/2015   HCT 39.9 10/11/2015   MCV 89.9 10/11/2015   PLT 168.0 10/11/2015   Lab Results  Component Value Date   NA 141 10/11/2015   K 3.9 10/11/2015   CO2 28 10/11/2015   GLUCOSE 88 10/11/2015   BUN 16 10/11/2015   CREATININE 0.69 10/11/2015   BILITOT 0.7 10/11/2015   ALKPHOS 52 10/11/2015   AST 16 10/11/2015   ALT 12 10/11/2015   PROT 8.9* 10/11/2015   ALBUMIN 3.8 10/11/2015   CALCIUM 9.6 10/11/2015   GFR 111.10 10/11/2015   Lab Results  Component Value Date   CHOL 157 10/11/2015   Lab Results  Component Value Date   HDL 54.20 10/11/2015   Lab Results  Component Value Date   LDLCALC 89 10/11/2015   Lab Results  Component Value Date   TRIG 69.0 10/11/2015   Lab Results  Component Value Date   CHOLHDL 3 10/11/2015     No results found for: HGBA1C     Assessment & Plan:   Problem List Items Addressed This Visit    AP (abdominal pain)   Relevant Medications   hyoscyamine (LEVSIN SL) 0.125 MG SL tablet   Other Relevant Orders   CBC (Completed)   TSH (Completed)   Hepatitis C antibody (Completed)   Lipid panel (Completed)   Comprehensive metabolic panel (Completed)   HIV antibody (with reflex) (Completed)   Cytology - PAP   Congestive heart failure ([place])    Discussed diet and lifestyle concerns with patient. Offered nutrition consult for further management. She agrees. Follows with cardiology      Relevant Medications   furosemide (LASIX) 20 MG tablet   benazepril (LOTENSIN) 20 MG tablet   carvedilol (COREG) 12.5 MG tablet   amLODipine (NORVASC) 5 MG tablet   Other Relevant Orders   Amb ref to Medical Nutrition Therapy-MNT   Dysuria    Urine culture negative. Encouraged increased hydration and did testing for yeast and BV      Relevant Orders   POCT Urinalysis Dipstick (Completed)   CULTURE, URINE COMPREHENSIVE (Completed)   CBC (Completed)   TSH (Completed)   Hepatitis C antibody (Completed)   Lipid panel (Completed)   Comprehensive metabolic panel (Completed)   HIV antibody (with reflex) (Completed)   Cytology - PAP   GERD (gastroesophageal reflux disease) - Primary    Avoid offending foods, start probiotics. Do not eat large meals in late evening and consider raising head of bed. Her insurance ends soon. Is given refills on meds today      Relevant Medications   pantoprazole (PROTONIX) 40 MG tablet   hyoscyamine (LEVSIN SL) 0.125 MG SL tablet   meclizine (ANTIVERT) 12.5  MG tablet   ondansetron (ZOFRAN) 4 MG tablet   sucralfate (CARAFATE) 1 G tablet   Other Relevant Orders   CBC (Completed)   TSH (Completed)   Hepatitis C antibody (Completed)   Lipid panel (Completed)   Comprehensive metabolic panel (Completed)   HIV antibody (with reflex) (Completed)   Cytology - PAP    Hyperlipidemia    Encouraged heart healthy diet, increase exercise, avoid trans fats, consider a krill oil cap daily      Relevant Medications   furosemide (LASIX) 20 MG tablet   benazepril (LOTENSIN) 20 MG tablet   carvedilol (COREG) 12.5 MG tablet   amLODipine (NORVASC) 5 MG tablet   Hypertension    Well controlled, no changes to meds. Encouraged heart healthy diet such as the DASH diet and exercise as tolerated.       Relevant Medications   furosemide (LASIX) 20 MG tablet   benazepril (LOTENSIN) 20 MG tablet   carvedilol (COREG) 12.5 MG tablet   amLODipine (NORVASC) 5 MG tablet   Sinusitis   Relevant Medications   fluticasone (FLONASE) 50 MCG/ACT nasal spray   hyoscyamine (LEVSIN SL) 0.125 MG SL tablet   promethazine (PHENERGAN) 25 MG tablet   fluconazole (DIFLUCAN) 150 MG tablet   Other Relevant Orders   CBC (Completed)   TSH (Completed)   Hepatitis C antibody (Completed)   Lipid panel (Completed)   Comprehensive metabolic panel (Completed)   HIV antibody (with reflex) (Completed)   Cytology - PAP    Other Visit Diagnoses    Benign essential HTN        Relevant Medications    furosemide (LASIX) 20 MG tablet    benazepril (LOTENSIN) 20 MG tablet    carvedilol (COREG) 12.5 MG tablet    amLODipine (NORVASC) 5 MG tablet    Other Relevant Orders    CBC (Completed)    TSH (Completed)    Hepatitis C antibody (Completed)    Lipid panel (Completed)    Comprehensive metabolic panel (Completed)    HIV antibody (with reflex) (Completed)    Cytology - PAP    Vaginitis and vulvovaginitis        Relevant Orders    CBC (Completed)    TSH (Completed)    Hepatitis C antibody (Completed)    Lipid panel (Completed)    Comprehensive metabolic panel (Completed)    HIV antibody (with reflex) (Completed)    Cytology - PAP    Cervical cancer screening        Relevant Orders    CBC (Completed)    TSH (Completed)    Hepatitis C antibody (Completed)    Lipid panel (Completed)     Comprehensive metabolic panel (Completed)    HIV antibody (with reflex) (Completed)    Cytology - PAP    Postmenopausal estrogen deficiency        Relevant Orders    DG Bone Density    Breast cancer screening        Relevant Orders    MM Digital Screening    Need for shingles vaccine        Relevant Orders    Varicella-zoster vaccine subcutaneous (Completed)    Need for prophylactic vaccination with combined diphtheria-tetanus-pertussis (DTP) vaccine        Relevant Orders    Tdap vaccine greater than or equal to 7yo IM (Completed)       I have discontinued Ms. Forgione's ondansetron, sucralfate, and benzonatate. I have also changed her furosemide. Additionally, I  am having her start on ondansetron, sucralfate, and fluconazole. Lastly, I am having her maintain her docusate sodium, Vitamin D3, Multiple Vitamins-Minerals (CENTRUM SILVER ADULT 50+ PO), butalbital-acetaminophen-caffeine, potassium chloride SA, pantoprazole, escitalopram, benazepril, carvedilol, amLODipine, fluticasone, hyoscyamine, meclizine, modafinil, and promethazine.  Meds ordered this encounter  Medications  . potassium chloride SA (K-DUR,KLOR-CON) 20 MEQ tablet    Sig: Take 1 tablet (20 mEq total) by mouth daily.    Dispense:  90 tablet    Refill:  1  . pantoprazole (PROTONIX) 40 MG tablet    Sig: Take 1 tablet (40 mg total) by mouth 2 (two) times daily.    Dispense:  180 tablet    Refill:  1  . furosemide (LASIX) 20 MG tablet    Sig: Take 1 tablet (20 mg total) by mouth daily.    Dispense:  90 tablet    Refill:  1  . escitalopram (LEXAPRO) 20 MG tablet    Sig: Take 1 tablet (20 mg total) by mouth daily.    Dispense:  90 tablet    Refill:  1  . benazepril (LOTENSIN) 20 MG tablet    Sig: Take 1 tablet (20 mg total) by mouth 2 (two) times daily.    Dispense:  180 tablet    Refill:  1  . carvedilol (COREG) 12.5 MG tablet    Sig: Take 1 tablet (12.5 mg total) by mouth 2 (two) times daily with a meal.     Dispense:  180 tablet    Refill:  1  . amLODipine (NORVASC) 5 MG tablet    Sig: Take 1 tablet (5 mg total) by mouth daily.    Dispense:  90 tablet    Refill:  2  . fluticasone (FLONASE) 50 MCG/ACT nasal spray    Sig: Place 2 sprays into both nostrils daily.    Dispense:  48 g    Refill:  1  . hyoscyamine (LEVSIN SL) 0.125 MG SL tablet    Sig: Place 1 tablet (0.125 mg total) under the tongue every 4 (four) hours as needed.    Dispense:  90 tablet    Refill:  01  . meclizine (ANTIVERT) 12.5 MG tablet    Sig: Take 1 tablet (12.5 mg total) by mouth 3 (three) times daily as needed for dizziness.    Dispense:  60 tablet    Refill:  1  . modafinil (PROVIGIL) 100 MG tablet    Sig: Take 1 tablet (100 mg total) by mouth daily.    Dispense:  30 tablet    Refill:  5  . ondansetron (ZOFRAN) 4 MG tablet    Sig: Take 1 tablet (4 mg total) by mouth every 8 (eight) hours as needed for nausea or vomiting.    Dispense:  90 tablet    Refill:  1  . promethazine (PHENERGAN) 25 MG tablet    Sig: Take 1 tablet (25 mg total) by mouth every 6 (six) hours as needed for nausea or vomiting.    Dispense:  60 tablet    Refill:  5  . sucralfate (CARAFATE) 1 G tablet    Sig: Take 1 tablet (1 g total) by mouth 2 (two) times daily.    Dispense:  180 tablet    Refill:  1  . fluconazole (DIFLUCAN) 150 MG tablet    Sig: Take 1 tablet (150 mg total) by mouth once.    Dispense:  1 tablet    Refill:  0     BLYTH, STACEY,  MD

## 2015-10-13 NOTE — Assessment & Plan Note (Signed)
Encouraged heart healthy diet, increase exercise, avoid trans fats, consider a krill oil cap daily 

## 2015-10-13 NOTE — Assessment & Plan Note (Signed)
Well controlled, no changes to meds. Encouraged heart healthy diet such as the DASH diet and exercise as tolerated.  °

## 2015-10-13 NOTE — Assessment & Plan Note (Addendum)
Avoid offending foods, start probiotics. Do not eat large meals in late evening and consider raising head of bed. Her insurance ends soon. Is given refills on meds today

## 2015-10-13 NOTE — Assessment & Plan Note (Addendum)
Discussed diet and lifestyle concerns with patient. Offered nutrition consult for further management. She agrees. Follows with cardiology

## 2015-10-13 NOTE — Assessment & Plan Note (Signed)
Urine culture negative. Encouraged increased hydration and did testing for yeast and BV

## 2015-10-14 ENCOUNTER — Telehealth: Payer: Self-pay | Admitting: Family Medicine

## 2015-10-14 LAB — CYTOLOGY - PAP

## 2015-10-14 LAB — HIV ANTIBODY (ROUTINE TESTING W REFLEX): HIV: NONREACTIVE

## 2015-10-14 MED ORDER — IBUPROFEN 600 MG PO TABS: 600.0000 mg | ORAL_TABLET | Freq: Two times a day (BID) | ORAL | Status: DC

## 2015-10-14 NOTE — Telephone Encounter (Signed)
Sent in and patient ok with 600 mg.

## 2015-10-14 NOTE — Telephone Encounter (Signed)
The patient would like a prescription for Ibuprofen, She uses the 800 mg just as needed for headaches. [facility] in [place]

## 2015-10-14 NOTE — Telephone Encounter (Signed)
I would prefer she drop to the 600 mg tab to protect her kidneys from damage. Am willing to prescribe either at patient discretion. 1 tab po bid prn pain with food. Disp #60 with 3 rf

## 2015-10-16 LAB — CERVICOVAGINAL ANCILLARY ONLY: Candida vaginitis: NEGATIVE

## 2015-10-17 ENCOUNTER — Other Ambulatory Visit: Payer: Self-pay | Admitting: Family Medicine

## 2015-10-17 ENCOUNTER — Ambulatory Visit (HOSPITAL_BASED_OUTPATIENT_CLINIC_OR_DEPARTMENT_OTHER)
Admission: RE | Admit: 2015-10-17 | Discharge: 2015-10-17 | Disposition: A | Discharge status: Home / Self Care | Payer: 59 | Source: Ambulatory Visit | Attending: Family Medicine | Admitting: Family Medicine

## 2015-10-17 DIAGNOSIS — M810 Age-related osteoporosis without current pathological fracture: Secondary | ICD-10-CM | POA: Insufficient documentation

## 2015-10-17 DIAGNOSIS — Z780 Asymptomatic menopausal state: Secondary | ICD-10-CM | POA: Insufficient documentation

## 2015-10-17 DIAGNOSIS — Z90710 Acquired absence of both cervix and uterus: Secondary | ICD-10-CM | POA: Insufficient documentation

## 2015-10-17 DIAGNOSIS — Z1239 Encounter for other screening for malignant neoplasm of breast: Secondary | ICD-10-CM

## 2015-10-17 DIAGNOSIS — Z87898 Personal history of other specified conditions: Secondary | ICD-10-CM

## 2015-10-17 MED ORDER — METRONIDAZOLE 500 MG PO TABS: 500.0000 mg | ORAL_TABLET | Freq: Two times a day (BID) | ORAL | Status: DC

## 2015-10-18 ENCOUNTER — Other Ambulatory Visit: Payer: Self-pay | Admitting: Family Medicine

## 2015-10-18 ENCOUNTER — Ambulatory Visit (INDEPENDENT_AMBULATORY_CARE_PROVIDER_SITE_OTHER): Payer: Managed Care, Other (non HMO) | Admitting: Physician Assistant

## 2015-10-18 ENCOUNTER — Encounter: Payer: Self-pay | Admitting: Physician Assistant

## 2015-10-18 VITALS — BP 173/100 | HR 86 | Temp 97.7°F | Wt 172.0 lb

## 2015-10-18 DIAGNOSIS — M62838 Other muscle spasm: Secondary | ICD-10-CM | POA: Insufficient documentation

## 2015-10-18 DIAGNOSIS — M810 Age-related osteoporosis without current pathological fracture: Secondary | ICD-10-CM | POA: Diagnosis not present

## 2015-10-18 DIAGNOSIS — G44209 Tension-type headache, unspecified, not intractable: Secondary | ICD-10-CM

## 2015-10-18 DIAGNOSIS — M6248 Contracture of muscle, other site: Secondary | ICD-10-CM | POA: Diagnosis not present

## 2015-10-18 LAB — VITAMIN D 25 HYDROXY (VIT D DEFICIENCY, FRACTURES): VITD: 24.66 ng/mL — ABNORMAL LOW (ref 30.00–100.00)

## 2015-10-18 MED ORDER — TIZANIDINE HCL 4 MG PO TABS: 4.0000 mg | ORAL_TABLET | Freq: Four times a day (QID) | ORAL | Status: DC | PRN

## 2015-10-18 MED ORDER — NAPROXEN 500 MG PO TABS: 500.0000 mg | ORAL_TABLET | Freq: Two times a day (BID) | ORAL | Status: DC

## 2015-10-18 MED ORDER — ERGOCALCIFEROL 1.25 MG (50000 UT) PO CAPS: 50000.0000 [IU] | ORAL_CAPSULE | ORAL | Status: DC

## 2015-10-18 MED ORDER — ALENDRONATE SODIUM 70 MG PO TABS: 70.0000 mg | ORAL_TABLET | ORAL | Status: DC

## 2015-10-18 NOTE — Assessment & Plan Note (Addendum)
Negative meningal signs. No rigidity noted. ROM is preserved but pain with flexion and rotation. Muscular tension and spasm noted. Rx Zanaflex and Naprosyn to use as directed. Topical Icy Hot. Heating pad as directed. Follow-up Monday.

## 2015-10-18 NOTE — Patient Instructions (Signed)
Please take the Naprosyn and Tizanidine as directed with food.  Apply topical Icy Hot or Aspercreme to the area. Continue heating pad as directed.  Follow-up with Dr. Charlett Blake of myself on Monday.

## 2015-10-18 NOTE — Progress Notes (Signed)
Patient presents to clinic today c/o neck pain since Saturday. Has had a couple of headaches since that time, described as a tightness and pulling from her neck. Denies stiffness. Denies fever, chills, cough or congestion. Denies trauma or injury.  Of not BP elevated this morning at 173/100. Asymptomatic other than headache. Patient has not taken her BP medications today.  Past Medical History  Diagnosis Date  . CHF (congestive heart failure) ([place])   . Bowel obstruction ([place]) 11/2014  . GERD (gastroesophageal reflux disease)   . Arthritis     knees, hands  . Hyperlipidemia   . Hypertension   . Depression   . Stroke ([place])     TIAs  . Congestive heart failure ([place]) 02/24/2015    S/p pacemaker  . Obstructive sleep apnea 03/31/2015      Allergies  Allergen Reactions  . Codeine Nausea And Vomiting    Family History  Problem Relation Age of Onset  . Diabetes Mother   . Hypertension Mother   . Heart disease Mother     s/p 1 stent  . Hyperlipidemia Mother   . Arthritis Mother   . Cancer Father     COLON  . Colon cancer Father 81  . Irritable bowel syndrome Sister   . Hyperlipidemia Daughter   . Hypertension Daughter   . Hypertension Maternal Grandmother   . Arthritis Maternal Grandmother   . Heart disease Maternal Grandfather     MI  . Hypertension Maternal Grandfather   . Arthritis Maternal Grandfather   . Hypertension Son     Social History   Social History  . Marital Status: Married    Spouse Name: N/A  . Number of Children: N/A  . Years of Education: N/A   Social History Main Topics  . Smoking status: Never Smoker   . Smokeless tobacco: None  . Alcohol Use: No  . Drug Use: No  . Sexual Activity: No     Comment: Lives alone, no dietary restrictions   Other Topics Concern  . None   Social History Narrative   Review of Systems - See HPI.  All other ROS are negative.  BP 173/100 mmHg  Pulse 86  Temp(Src) 97.7 F (36.5 C) (Oral)  Wt 172 lb  (78.019 kg)  SpO2 97%  Physical Exam  Constitutional: She is oriented to person, place, and time and well-developed, well-nourished, and in no distress.  HENT:  Head: Normocephalic and atraumatic.  Eyes: Conjunctivae are normal.  Neck: Normal range of motion. Muscular tenderness present. No spinous process tenderness present. No rigidity. No Brudzinski's sign and no Kernig's sign noted.  Cardiovascular: Normal rate, regular rhythm, normal heart sounds and intact distal pulses.   Pulmonary/Chest: Effort normal and breath sounds normal. No respiratory distress. She has no wheezes. She has no rales. She exhibits no tenderness.  Neurological: She is alert and oriented to person, place, and time.  Skin: Skin is warm and dry. No rash noted.  Psychiatric: Affect normal.  Vitals reviewed.   Recent Results (from the past 2160 hour(s))  Cytology - PAP     Status: None   Collection Time: 10/11/15 12:00 AM  Result Value Ref Range   CYTOLOGY - PAP PAP RESULT   Cervicovaginal ancillary only     Status: Abnormal   Collection Time: 10/11/15 12:00 AM  Result Value Ref Range   Bacterial vaginitis (A)     **POSITIVE for Gardnerella vaginalis POSITIVE for Atopobium vaginae POSITIVE for Megasphaera 1**  Comment: Normal Reference Range - Negative   Candida vaginitis Negative for Candida Vaginitis Microorganisms     Comment: Normal Reference Range - Negative  POCT Urinalysis Dipstick     Status: None   Collection Time: 10/11/15 11:30 AM  Result Value Ref Range   Color, UA light yellow    Clarity, UA clear    Glucose, UA negatrive    Bilirubin, UA negative    Ketones, UA negative    Spec Grav, UA >=1.030    Blood, UA negative    pH, UA 6.0    Protein, UA negative    Urobilinogen, UA 2.0    Nitrite, UA negative    Leukocytes, UA Negative Negative  CULTURE, URINE COMPREHENSIVE     Status: None   Collection Time: 10/11/15 11:36 AM  Result Value Ref Range   Colony Count NO GROWTH    Organism  ID, Bacteria NO GROWTH   CBC     Status: Abnormal   Collection Time: 10/11/15 12:28 PM  Result Value Ref Range   WBC 3.9 (L) 4.0 - 10.5 K/uL   RBC 4.43 3.87 - 5.11 Mil/uL   Platelets 168.0 150.0 - 400.0 K/uL   Hemoglobin 12.9 12.0 - 15.0 g/dL   HCT 39.9 36.0 - 46.0 %   MCV 89.9 78.0 - 100.0 fl   MCHC 32.4 30.0 - 36.0 g/dL   RDW 13.2 11.5 - 15.5 %  TSH     Status: None   Collection Time: 10/11/15 12:28 PM  Result Value Ref Range   TSH 1.20 0.35 - 4.50 uIU/mL  Hepatitis C antibody     Status: None   Collection Time: 10/11/15 12:28 PM  Result Value Ref Range   HCV Ab NEGATIVE NEGATIVE  Lipid panel     Status: None   Collection Time: 10/11/15 12:28 PM  Result Value Ref Range   Cholesterol 157 0 - 200 mg/dL    Comment: ATP III Classification       Desirable:  < 200 mg/dL               Borderline High:  200 - 239 mg/dL          High:  > = 240 mg/dL   Triglycerides 69.0 0.0 - 149.0 mg/dL    Comment: Normal:  <150 mg/dLBorderline High:  150 - 199 mg/dL   HDL 54.20 >39.00 mg/dL   VLDL 13.8 0.0 - 40.0 mg/dL   LDL Cholesterol 89 0 - 99 mg/dL   Total CHOL/HDL Ratio 3     Comment:                Men          Women1/2 Average Risk     3.4          3.3Average Risk          5.0          4.42X Average Risk          9.6          7.13X Average Risk          15.0          11.0                       NonHDL 102.73     Comment: NOTE:  Non-HDL goal should be 30 mg/dL higher than patient's LDL goal (i.e. LDL goal of < 70 mg/dL, would have non-HDL goal  of < 100 mg/dL)  Comprehensive metabolic panel     Status: Abnormal   Collection Time: 10/11/15 12:28 PM  Result Value Ref Range   Sodium 141 135 - 145 mEq/L   Potassium 3.9 3.5 - 5.1 mEq/L   Chloride 105 96 - 112 mEq/L   CO2 28 19 - 32 mEq/L   Glucose, Bld 88 70 - 99 mg/dL   BUN 16 6 - 23 mg/dL   Creatinine, Ser 0.69 0.40 - 1.20 mg/dL   Total Bilirubin 0.7 0.2 - 1.2 mg/dL   Alkaline Phosphatase 52 39 - 117 U/L   AST 16 0 - 37 U/L   ALT 12 0 - 35  U/L   Total Protein 8.9 (H) 6.0 - 8.3 g/dL   Albumin 3.8 3.5 - 5.2 g/dL   Calcium 9.6 8.4 - 10.5 mg/dL   GFR 111.10 >60.00 mL/min  HIV antibody (with reflex)     Status: None   Collection Time: 10/11/15 12:28 PM  Result Value Ref Range   HIV 1&2 Ab, 4th Generation NONREACTIVE NONREACTIVE    Comment:   HIV-1 antigen and HIV-1/HIV-2 antibodies were not detected.  There is no laboratory evidence of HIV infection.   HIV-1/2 Antibody Diff        Not indicated. HIV-1 RNA, Qual TMA          Not indicated.     PLEASE NOTE: This information has been disclosed to you from records whose confidentiality may be protected by state law. If your state requires such protection, then the state law prohibits you from making any further disclosure of the information without the specific written consent of the person to whom it pertains, or as otherwise permitted by law. A general authorization for the release of medical or other information is NOT sufficient for this purpose.   The performance of this assay has not been clinically validated in patients less than 6 years old.   For additional information please refer to http://education.[employer].com/faq/FAQ106.  (This link is being provided for informational/educational purposes only.)       Assessment/Plan: Muscle spasms of neck Negative meningal signs. No rigidity noted. ROM is preserved but pain with flexion and rotation. Muscular tension and spasm noted. Rx Zanaflex and Naprosyn to use as directed. Topical Icy Hot. Heating pad as directed. Follow-up Monday.

## 2015-10-18 NOTE — Progress Notes (Signed)
Pre visit review using our clinic review tool, if applicable. No additional management support is needed unless otherwise documented below in the visit note. 

## 2015-10-21 ENCOUNTER — Encounter: Payer: Self-pay | Admitting: Physician Assistant

## 2015-10-21 ENCOUNTER — Ambulatory Visit (INDEPENDENT_AMBULATORY_CARE_PROVIDER_SITE_OTHER): Payer: Managed Care, Other (non HMO) | Admitting: Physician Assistant

## 2015-10-21 VITALS — BP 146/82 | HR 89 | Temp 98.2°F | Ht 66.0 in | Wt 175.6 lb

## 2015-10-21 DIAGNOSIS — G44209 Tension-type headache, unspecified, not intractable: Secondary | ICD-10-CM | POA: Insufficient documentation

## 2015-10-21 MED ORDER — TIZANIDINE HCL 4 MG PO TABS: 4.0000 mg | ORAL_TABLET | Freq: Four times a day (QID) | ORAL | Status: DC | PRN

## 2015-10-21 NOTE — Assessment & Plan Note (Signed)
Headaches resolved. Only mild symptoms remain -- neck tension and muscle spasm. Continue medications as directed. Symptoms should continue to resolve over this week.

## 2015-10-21 NOTE — Patient Instructions (Signed)
Tami [name],  I am glad you are feeling better! Your symptoms should continue to improve and resolve over the rest of the week. Continue the Naprosyn as directed. The Zanaflex can be used as directed but only when needed. Continue resting and your supportive measures. Follow-up if things are not resolving or any new symptoms develop.

## 2015-10-21 NOTE — Progress Notes (Signed)
Patient presents to clinic today for follow-up of tension headaches and trapezius strain/spasm. Patient endorses taking Naprosyn and Zanaflex as directed. Endorses pain is much improved. Headaches have resolved. Still having mild muscle tension in neck. Denies new or worsening symptoms.  Past Medical History  Diagnosis Date  . CHF (congestive heart failure) ([place])   . Bowel obstruction ([place]) 11/2014  . GERD (gastroesophageal reflux disease)   . Arthritis     knees, hands  . Hyperlipidemia   . Hypertension   . Depression   . Stroke ([place])     TIAs  . Congestive heart failure ([place]) 02/24/2015    S/p pacemaker  . Obstructive sleep apnea 03/31/2015    Current Outpatient Prescriptions on File Prior to Visit  Medication Sig Dispense Refill  . alendronate (FOSAMAX) 70 MG tablet Take 1 tablet (70 mg total) by mouth once a week. Take with a full glass of water on an empty stomach. 12 tablet 3  . amLODipine (NORVASC) 5 MG tablet Take 1 tablet (5 mg total) by mouth daily. 90 tablet 2  . benazepril (LOTENSIN) 20 MG tablet Take 1 tablet (20 mg total) by mouth 2 (two) times daily. 180 tablet 1  . butalbital-acetaminophen-caffeine (FIORICET) 50-325-40 MG tablet Take by mouth.    . carvedilol (COREG) 12.5 MG tablet Take 1 tablet (12.5 mg total) by mouth 2 (two) times daily with a meal. 180 tablet 1  . Cholecalciferol (VITAMIN D3) 5000 UNITS CAPS Take 1 capsule by mouth daily.    Marland Kitchen docusate sodium (COLACE) 50 MG capsule Take 50 mg by mouth 2 (two) times daily.    . ergocalciferol (VITAMIN D2) 50000 UNITS capsule Take 1 capsule (50,000 Units total) by mouth once a week. 12 capsule 1  . escitalopram (LEXAPRO) 20 MG tablet Take 1 tablet (20 mg total) by mouth daily. 90 tablet 1  . fluconazole (DIFLUCAN) 150 MG tablet Take 1 tablet (150 mg total) by mouth once. 1 tablet 0  . fluticasone (FLONASE) 50 MCG/ACT nasal spray Place 2 sprays into both nostrils daily. 48 g 1  . furosemide (LASIX) 20 MG tablet  Take 1 tablet (20 mg total) by mouth daily. 90 tablet 1  . hyoscyamine (LEVSIN SL) 0.125 MG SL tablet Place 1 tablet (0.125 mg total) under the tongue every 4 (four) hours as needed. 90 tablet 01  . ibuprofen (ADVIL,MOTRIN) 600 MG tablet Take 1 tablet (600 mg total) by mouth 2 (two) times daily with a meal. 60 tablet 3  . meclizine (ANTIVERT) 12.5 MG tablet Take 1 tablet (12.5 mg total) by mouth 3 (three) times daily as needed for dizziness. 60 tablet 1  . metroNIDAZOLE (FLAGYL) 500 MG tablet Take 1 tablet (500 mg total) by mouth 2 (two) times daily. Take for 5 days 10 tablet 0  . modafinil (PROVIGIL) 100 MG tablet Take 1 tablet (100 mg total) by mouth daily. 30 tablet 5  . Multiple Vitamins-Minerals (CENTRUM SILVER ADULT 50+ PO) Take by mouth.    . naproxen (NAPROSYN) 500 MG tablet Take 1 tablet (500 mg total) by mouth 2 (two) times daily with a meal. 30 tablet 0  . ondansetron (ZOFRAN) 4 MG tablet Take 1 tablet (4 mg total) by mouth every 8 (eight) hours as needed for nausea or vomiting. 90 tablet 1  . pantoprazole (PROTONIX) 40 MG tablet Take 1 tablet (40 mg total) by mouth 2 (two) times daily. 180 tablet 1  . potassium chloride SA (K-DUR,KLOR-CON) 20 MEQ tablet Take  1 tablet (20 mEq total) by mouth daily. 90 tablet 1  . promethazine (PHENERGAN) 25 MG tablet Take 1 tablet (25 mg total) by mouth every 6 (six) hours as needed for nausea or vomiting. 60 tablet 5  . sucralfate (CARAFATE) 1 G tablet Take 1 tablet (1 g total) by mouth 2 (two) times daily. 180 tablet 1   No current facility-administered medications on file prior to visit.    Allergies  Allergen Reactions  . Codeine Nausea And Vomiting    Family History  Problem Relation Age of Onset  . Diabetes Mother   . Hypertension Mother   . Heart disease Mother     s/p 1 stent  . Hyperlipidemia Mother   . Arthritis Mother   . Cancer Father     COLON  . Colon cancer Father 23  . Irritable bowel syndrome Sister   . Hyperlipidemia  Daughter   . Hypertension Daughter   . Hypertension Maternal Grandmother   . Arthritis Maternal Grandmother   . Heart disease Maternal Grandfather     MI  . Hypertension Maternal Grandfather   . Arthritis Maternal Grandfather   . Hypertension Son     Social History   Social History  . Marital Status: Married    Spouse Name: N/A  . Number of Children: N/A  . Years of Education: N/A   Social History Main Topics  . Smoking status: Never Smoker   . Smokeless tobacco: None  . Alcohol Use: No  . Drug Use: No  . Sexual Activity: No     Comment: Lives alone, no dietary restrictions   Other Topics Concern  . None   Social History Narrative    Review of Systems - See HPI.  All other ROS are negative.  BP 146/82 mmHg  Pulse 89  Temp(Src) 98.2 F (36.8 C) (Oral)  Ht 5\' 6"  (1.676 m)  Wt 175 lb 9.6 oz (79.652 kg)  BMI 28.36 kg/m2  SpO2 97%  Physical Exam  Constitutional: She is well-developed, well-nourished, and in no distress.  HENT:  Head: Normocephalic and atraumatic.  Eyes: Conjunctivae are normal.  Neck: Full passive range of motion without pain. Muscular tenderness present. No spinous process tenderness present.  Cardiovascular: Normal rate, regular rhythm, normal heart sounds and intact distal pulses.   Pulmonary/Chest: Effort normal and breath sounds normal. No respiratory distress. She has no wheezes. She has no rales. She exhibits no tenderness.  Vitals reviewed.   Recent Results (from the past 2160 hour(s))  Cytology - PAP     Status: None   Collection Time: 10/11/15 12:00 AM  Result Value Ref Range   CYTOLOGY - PAP PAP RESULT   Cervicovaginal ancillary only     Status: Abnormal   Collection Time: 10/11/15 12:00 AM  Result Value Ref Range   Bacterial vaginitis (A)     **POSITIVE for Gardnerella vaginalis POSITIVE for Atopobium vaginae POSITIVE for Megasphaera 1**    Comment: Normal Reference Range - Negative   Candida vaginitis Negative for Candida  Vaginitis Microorganisms     Comment: Normal Reference Range - Negative  POCT Urinalysis Dipstick     Status: None   Collection Time: 10/11/15 11:30 AM  Result Value Ref Range   Color, UA light yellow    Clarity, UA clear    Glucose, UA negatrive    Bilirubin, UA negative    Ketones, UA negative    Spec Grav, UA >=1.030    Blood, UA negative  pH, UA 6.0    Protein, UA negative    Urobilinogen, UA 2.0    Nitrite, UA negative    Leukocytes, UA Negative Negative  CULTURE, URINE COMPREHENSIVE     Status: None   Collection Time: 10/11/15 11:36 AM  Result Value Ref Range   Colony Count NO GROWTH    Organism ID, Bacteria NO GROWTH   CBC     Status: Abnormal   Collection Time: 10/11/15 12:28 PM  Result Value Ref Range   WBC 3.9 (L) 4.0 - 10.5 K/uL   RBC 4.43 3.87 - 5.11 Mil/uL   Platelets 168.0 150.0 - 400.0 K/uL   Hemoglobin 12.9 12.0 - 15.0 g/dL   HCT 39.9 36.0 - 46.0 %   MCV 89.9 78.0 - 100.0 fl   MCHC 32.4 30.0 - 36.0 g/dL   RDW 13.2 11.5 - 15.5 %  TSH     Status: None   Collection Time: 10/11/15 12:28 PM  Result Value Ref Range   TSH 1.20 0.35 - 4.50 uIU/mL  Hepatitis C antibody     Status: None   Collection Time: 10/11/15 12:28 PM  Result Value Ref Range   HCV Ab NEGATIVE NEGATIVE  Lipid panel     Status: None   Collection Time: 10/11/15 12:28 PM  Result Value Ref Range   Cholesterol 157 0 - 200 mg/dL    Comment: ATP III Classification       Desirable:  < 200 mg/dL               Borderline High:  200 - 239 mg/dL          High:  > = 240 mg/dL   Triglycerides 69.0 0.0 - 149.0 mg/dL    Comment: Normal:  <150 mg/dLBorderline High:  150 - 199 mg/dL   HDL 54.20 >39.00 mg/dL   VLDL 13.8 0.0 - 40.0 mg/dL   LDL Cholesterol 89 0 - 99 mg/dL   Total CHOL/HDL Ratio 3     Comment:                Men          Women1/2 Average Risk     3.4          3.3Average Risk          5.0          4.42X Average Risk          9.6          7.13X Average Risk          15.0          11.0                        NonHDL 102.73     Comment: NOTE:  Non-HDL goal should be 30 mg/dL higher than patient's LDL goal (i.e. LDL goal of < 70 mg/dL, would have non-HDL goal of < 100 mg/dL)  Comprehensive metabolic panel     Status: Abnormal   Collection Time: 10/11/15 12:28 PM  Result Value Ref Range   Sodium 141 135 - 145 mEq/L   Potassium 3.9 3.5 - 5.1 mEq/L   Chloride 105 96 - 112 mEq/L   CO2 28 19 - 32 mEq/L   Glucose, Bld 88 70 - 99 mg/dL   BUN 16 6 - 23 mg/dL   Creatinine, Ser 0.69 0.40 - 1.20 mg/dL   Total Bilirubin 0.7 0.2 - 1.2 mg/dL  Alkaline Phosphatase 52 39 - 117 U/L   AST 16 0 - 37 U/L   ALT 12 0 - 35 U/L   Total Protein 8.9 (H) 6.0 - 8.3 g/dL   Albumin 3.8 3.5 - 5.2 g/dL   Calcium 9.6 8.4 - 10.5 mg/dL   GFR 111.10 >60.00 mL/min  HIV antibody (with reflex)     Status: None   Collection Time: 10/11/15 12:28 PM  Result Value Ref Range   HIV 1&2 Ab, 4th Generation NONREACTIVE NONREACTIVE    Comment:   HIV-1 antigen and HIV-1/HIV-2 antibodies were not detected.  There is no laboratory evidence of HIV infection.   HIV-1/2 Antibody Diff        Not indicated. HIV-1 RNA, Qual TMA          Not indicated.     PLEASE NOTE: This information has been disclosed to you from records whose confidentiality may be protected by state law. If your state requires such protection, then the state law prohibits you from making any further disclosure of the information without the specific written consent of the person to whom it pertains, or as otherwise permitted by law. A general authorization for the release of medical or other information is NOT sufficient for this purpose.   The performance of this assay has not been clinically validated in patients less than 75 years old.   For additional information please refer to http://education.questdiagnostics.com/faq/FAQ106.  (This link is being provided for informational/educational purposes only.)     Vitamin D (25 hydroxy)     Status:  Abnormal   Collection Time: 10/18/15  7:54 AM  Result Value Ref Range   VITD 24.66 (L) 30.00 - 100.00 ng/mL    Assessment/Plan: Tension headache Headaches resolved. Only mild symptoms remain -- neck tension and muscle spasm. Continue medications as directed. Symptoms should continue to resolve over this week.

## 2015-10-21 NOTE — Progress Notes (Signed)
Pre visit review using our clinic review tool, if applicable. No additional management support is needed unless otherwise documented below in the visit note. 

## 2015-11-05 ENCOUNTER — Ambulatory Visit: Payer: PRIVATE HEALTH INSURANCE | Admitting: Skilled Nursing Facility1

## 2015-11-05 ENCOUNTER — Other Ambulatory Visit: Payer: Self-pay | Admitting: Physician Assistant

## 2015-11-05 NOTE — Telephone Encounter (Signed)
Requesting Zanaflex 4mg -Take 1 tablet by mouth every 6 hours as needed for muscle spasm. Last refill:10/21/15;#30,0 Last OV:10/21/15 Please advise.//AB/CMA

## 2015-12-03 ENCOUNTER — Other Ambulatory Visit: Payer: Self-pay | Admitting: Family Medicine

## 2015-12-03 DIAGNOSIS — Z87898 Personal history of other specified conditions: Secondary | ICD-10-CM

## 2015-12-20 ENCOUNTER — Telehealth: Payer: Self-pay | Admitting: Family Medicine

## 2015-12-20 ENCOUNTER — Other Ambulatory Visit: Payer: Self-pay | Admitting: Family Medicine

## 2015-12-20 DIAGNOSIS — Z1239 Encounter for other screening for malignant neoplasm of breast: Secondary | ICD-10-CM

## 2015-12-20 NOTE — Telephone Encounter (Signed)
I have ordered the [facility] for [facility] please fax and let her know

## 2015-12-20 NOTE — Telephone Encounter (Signed)
Relation to [id number] Call back [place]:  Reason for call:  Patient requesting her mamo orders to be placed with [facility] [address], [place], [place] [address] [phone]. Patient apologizes for the confusion.

## 2015-12-20 NOTE — Telephone Encounter (Signed)
Faxed order to [facility] the patient left a message order has been faxed as requested.

## 2015-12-23 ENCOUNTER — Ambulatory Visit (INDEPENDENT_AMBULATORY_CARE_PROVIDER_SITE_OTHER): Payer: Self-pay | Admitting: Family Medicine

## 2015-12-23 ENCOUNTER — Encounter: Payer: Self-pay | Admitting: Family Medicine

## 2015-12-23 VITALS — BP 142/100 | HR 93 | Temp 98.1°F | Ht 66.0 in | Wt 171.5 lb

## 2015-12-23 DIAGNOSIS — F329 Major depressive disorder, single episode, unspecified: Secondary | ICD-10-CM

## 2015-12-23 DIAGNOSIS — E785 Hyperlipidemia, unspecified: Secondary | ICD-10-CM

## 2015-12-23 DIAGNOSIS — I10 Essential (primary) hypertension: Secondary | ICD-10-CM

## 2015-12-23 DIAGNOSIS — F32A Depression, unspecified: Secondary | ICD-10-CM

## 2015-12-23 DIAGNOSIS — K219 Gastro-esophageal reflux disease without esophagitis: Secondary | ICD-10-CM

## 2015-12-23 DIAGNOSIS — G4700 Insomnia, unspecified: Secondary | ICD-10-CM

## 2015-12-23 DIAGNOSIS — J329 Chronic sinusitis, unspecified: Secondary | ICD-10-CM

## 2015-12-23 MED ORDER — CARVEDILOL 25 MG PO TABS: 25.0000 mg | ORAL_TABLET | Freq: Two times a day (BID) | ORAL | Status: DC

## 2015-12-23 MED ORDER — ZOLPIDEM TARTRATE 10 MG PO TABS: 5.0000 mg | ORAL_TABLET | Freq: Every evening | ORAL | Status: DC | PRN

## 2015-12-23 MED ORDER — DOXYCYCLINE HYCLATE 100 MG PO TABS: 100.0000 mg | ORAL_TABLET | Freq: Two times a day (BID) | ORAL | Status: DC

## 2015-12-23 NOTE — Progress Notes (Signed)
Pre visit review using our clinic review tool, if applicable. No additional management support is needed unless otherwise documented below in the visit note. 

## 2015-12-23 NOTE — Patient Instructions (Signed)
Encouraged increased rest and hydration, add probiotics, zinc such as Coldeze or Xicam. Treat fevers as needed, plain mucinex twice daily, Vitamin C, Aged garlic and elderberry liquid or capsule   Sinusitis, Adult Sinusitis is redness, soreness, and inflammation of the paranasal sinuses. Paranasal sinuses are air pockets within the bones of your face. They are located beneath your eyes, in the middle of your forehead, and above your eyes. In healthy paranasal sinuses, mucus is able to drain out, and air is able to circulate through them by way of your nose. However, when your paranasal sinuses are inflamed, mucus and air can become trapped. This can allow bacteria and other germs to grow and cause infection. Sinusitis can develop quickly and last only a short time (acute) or continue over a long period (chronic). Sinusitis that lasts for more than 12 weeks is considered chronic. CAUSES Causes of sinusitis include:  Allergies.  Structural abnormalities, such as displacement of the cartilage that separates your nostrils (deviated septum), which can decrease the air flow through your nose and sinuses and affect sinus drainage.  Functional abnormalities, such as when the small hairs (cilia) that line your sinuses and help remove mucus do not work properly or are not present. SIGNS AND SYMPTOMS Symptoms of acute and chronic sinusitis are the same. The primary symptoms are pain and pressure around the affected sinuses. Other symptoms include:  Upper toothache.  Earache.  Headache.  Bad breath.  Decreased sense of smell and taste.  A cough, which worsens when you are lying flat.  Fatigue.  Fever.  Thick drainage from your nose, which often is green and may contain pus (purulent).  Swelling and warmth over the affected sinuses. DIAGNOSIS Your health care provider will perform a physical exam. During your exam, your health care provider may perform any of the following to help determine  if you have acute sinusitis or chronic sinusitis:  Look in your nose for signs of abnormal growths in your nostrils (nasal polyps).  Tap over the affected sinus to check for signs of infection.  View the inside of your sinuses using an imaging device that has a light attached (endoscope). If your health care provider suspects that you have chronic sinusitis, one or more of the following tests may be recommended:  Allergy tests.  Nasal culture. A sample of mucus is taken from your nose, sent to a lab, and screened for bacteria.  Nasal cytology. A sample of mucus is taken from your nose and examined by your health care provider to determine if your sinusitis is related to an allergy. TREATMENT Most cases of acute sinusitis are related to a viral infection and will resolve on their own within 10 days. Sometimes, medicines are prescribed to help relieve symptoms of both acute and chronic sinusitis. These may include pain medicines, decongestants, nasal steroid sprays, or saline sprays. However, for sinusitis related to a bacterial infection, your health care provider will prescribe antibiotic medicines. These are medicines that will help kill the bacteria causing the infection. Rarely, sinusitis is caused by a fungal infection. In these cases, your health care provider will prescribe antifungal medicine. For some cases of chronic sinusitis, surgery is needed. Generally, these are cases in which sinusitis recurs more than 3 times per year, despite other treatments. HOME CARE INSTRUCTIONS  Drink plenty of water. Water helps thin the mucus so your sinuses can drain more easily.  Use a humidifier.  Inhale steam 3-4 times a day (for example, sit in the bathroom  with the shower running).  Apply a warm, moist washcloth to your face 3-4 times a day, or as directed by your health care provider.  Use saline nasal sprays to help moisten and clean your sinuses.  Take medicines only as directed by your  health care provider.  If you were prescribed either an antibiotic or antifungal medicine, finish it all even if you start to feel better. SEEK IMMEDIATE MEDICAL CARE IF:  You have increasing pain or severe headaches.  You have nausea, vomiting, or drowsiness.  You have swelling around your face.  You have vision problems.  You have a stiff neck.  You have difficulty breathing.   This information is not intended to replace advice given to you by your health care provider. Make sure you discuss any questions you have with your health care provider.   Document Released: 10/26/2005 Document Revised: 11/16/2014 Document Reviewed: 11/10/2011 Elsevier Interactive Patient Education [employer].

## 2015-12-24 ENCOUNTER — Telehealth: Payer: Self-pay | Admitting: *Deleted

## 2015-12-24 DIAGNOSIS — Z87898 Personal history of other specified conditions: Secondary | ICD-10-CM

## 2015-12-24 LAB — HM MAMMOGRAPHY

## 2015-12-24 NOTE — Telephone Encounter (Signed)
Call from [facility] requesting orders be faxed for bilateral screening mammogram and R breast ultrasound. Ok per Dr. Charlett Blake. Orders placed and faxed to [phone] as requested.

## 2015-12-26 ENCOUNTER — Encounter: Payer: Self-pay | Admitting: Family Medicine

## 2015-12-29 DIAGNOSIS — G4700 Insomnia, unspecified: Secondary | ICD-10-CM | POA: Insufficient documentation

## 2015-12-29 NOTE — Assessment & Plan Note (Signed)
Encouraged increased rest and hydration, add probiotics, zinc such as Coldeze or Xicam. Treat fevers as needed. Given rx for Doxycycline

## 2015-12-29 NOTE — Assessment & Plan Note (Signed)
Encouraged heart healthy diet, increase exercise, avoid trans fats, consider a krill oil cap daily 

## 2015-12-29 NOTE — Assessment & Plan Note (Addendum)
Encouraged heart healthy diet such as the DASH diet and exercise as tolerated. Carvedilol increased to 25 mg po bid

## 2015-12-29 NOTE — Assessment & Plan Note (Signed)
Father past away in January after mother passed away in 2023/07/30. She is managing with current meds no changes

## 2015-12-29 NOTE — Progress Notes (Signed)
[name], female   DOB: 07-02-1956, 60 y.o.   MRN: [id number]   Subjective:    [name], female    DOB: 11/07/1956, 60 y.o.   MRN: [id number]  Chief Complaint  Patient presents with  . Sinusitis    HPI Patient is in today for evaluation of worsening cough and ocngestion. She has been under a great deal of stress since both of her parents have died recently. She endorses, headache, ear pain, scratchy throat, PND, dry cough, fatigue, myalgias and intemittent diarrhea. Denies fevers, chills.  Past Medical History  Diagnosis Date  . CHF (congestive heart failure) ([place])   . Bowel obstruction ([place]) 11/2014  . GERD (gastroesophageal reflux disease)   . Arthritis     knees, hands  . Hyperlipidemia   . Hypertension   . Depression   . Stroke ([place])     TIAs  . Congestive heart failure ([place]) 02/24/2015    S/p pacemaker  . Obstructive sleep apnea 03/31/2015    Past Surgical History  Procedure Laterality Date  . Pacemaker insertion  08/2014  . Cholecystectomy    . Tubal ligation    . Knee surgery      right, repair torn torn cartialage  . Abdominal hysterectomy      menorraghia, 2006, total  . Tonsillectomy      Family History  Problem Relation Age of Onset  . Diabetes Mother   . Hypertension Mother   . Heart disease Mother     s/p 1 stent  . Hyperlipidemia Mother   . Arthritis Mother   . Cancer Father     COLON  . Colon cancer Father 58  . Irritable bowel syndrome Sister   . Hyperlipidemia Daughter   . Hypertension Daughter   . Hypertension Maternal Grandmother   . Arthritis Maternal Grandmother   . Heart disease Maternal Grandfather     MI  . Hypertension Maternal Grandfather   . Arthritis Maternal Grandfather   . Hypertension Son     Social History   Social History  . Marital Status: Married    Spouse Name: N/A  . Number of Children: N/A  . Years of Education: N/A   Occupational History  . [name].   Social History  Main Topics  . Smoking status: Never Smoker   . Smokeless tobacco: [name]  . Alcohol Use: No  . Drug Use: No  . Sexual Activity: No     Comment: Lives alone, no dietary restrictions   Other Topics Concern  . [name]   Social History Narrative    Outpatient Prescriptions Prior to Visit  Medication Sig Dispense Refill  . alendronate (FOSAMAX) 70 MG tablet Take 1 tablet (70 mg total) by mouth once a week. Take with a full glass of water on an empty stomach. 12 tablet 3  . amLODipine (NORVASC) 5 MG tablet Take 1 tablet (5 mg total) by mouth daily. 90 tablet 2  . benazepril (LOTENSIN) 20 MG tablet Take 1 tablet (20 mg total) by mouth 2 (two) times daily. 180 tablet 1  . butalbital-acetaminophen-caffeine (FIORICET) 50-325-40 MG tablet Take by mouth.    . Cholecalciferol (VITAMIN D3) 5000 UNITS CAPS Take 1 capsule by mouth daily.    Marland Kitchen docusate sodium (COLACE) 50 MG capsule Take 50 mg by mouth 2 (two) times daily.    . ergocalciferol (VITAMIN D2) 50000 UNITS capsule Take 1 capsule (50,000 Units total) by mouth once a week.  12 capsule 1  . escitalopram (LEXAPRO) 20 MG tablet Take 1 tablet (20 mg total) by mouth daily. 90 tablet 1  . fluconazole (DIFLUCAN) 150 MG tablet Take 1 tablet (150 mg total) by mouth once. 1 tablet 0  . fluticasone (FLONASE) 50 MCG/ACT nasal spray Place 2 sprays into both nostrils daily. 48 g 1  . furosemide (LASIX) 20 MG tablet Take 1 tablet (20 mg total) by mouth daily. 90 tablet 1  . hyoscyamine (LEVSIN SL) 0.125 MG SL tablet Place 1 tablet (0.125 mg total) under the tongue every 4 (four) hours as needed. 90 tablet 01  . ibuprofen (ADVIL,MOTRIN) 600 MG tablet Take 1 tablet (600 mg total) by mouth 2 (two) times daily with a meal. 60 tablet 3  . meclizine (ANTIVERT) 12.5 MG tablet Take 1 tablet (12.5 mg total) by mouth 3 (three) times daily as needed for dizziness. 60 tablet 1  . metroNIDAZOLE (FLAGYL) 500 MG tablet Take 1 tablet (500 mg total) by mouth 2 (two)  times daily. Take for 5 days 10 tablet 0  . modafinil (PROVIGIL) 100 MG tablet Take 1 tablet (100 mg total) by mouth daily. 30 tablet 5  . Multiple Vitamins-Minerals (CENTRUM SILVER ADULT 50+ PO) Take by mouth.    . naproxen (NAPROSYN) 500 MG tablet Take 1 tablet (500 mg total) by mouth 2 (two) times daily with a meal. 30 tablet 0  . ondansetron (ZOFRAN) 4 MG tablet Take 1 tablet (4 mg total) by mouth every 8 (eight) hours as needed for nausea or vomiting. 90 tablet 1  . pantoprazole (PROTONIX) 40 MG tablet Take 1 tablet (40 mg total) by mouth 2 (two) times daily. 180 tablet 1  . potassium chloride SA (K-DUR,KLOR-CON) 20 MEQ tablet Take 1 tablet (20 mEq total) by mouth daily. 90 tablet 1  . promethazine (PHENERGAN) 25 MG tablet Take 1 tablet (25 mg total) by mouth every 6 (six) hours as needed for nausea or vomiting. 60 tablet 5  . sucralfate (CARAFATE) 1 G tablet Take 1 tablet (1 g total) by mouth 2 (two) times daily. 180 tablet 1  . tiZANidine (ZANAFLEX) 4 MG tablet TAKE ONE TABLET BY MOUTH EVERY 6 HOURS AS NEEDED FOR MUSCLE SPASM 30 tablet 0  . carvedilol (COREG) 12.5 MG tablet Take 1 tablet (12.5 mg total) by mouth 2 (two) times daily with a meal. 180 tablet 1   No facility-administered medications prior to visit.    Allergies  Allergen Reactions  . Codeine Nausea And Vomiting    Review of Systems  Constitutional: Positive for malaise/fatigue. Negative for fever.  HENT: Positive for congestion and sore throat.   Eyes: Negative for discharge.  Respiratory: Positive for cough, sputum production and shortness of breath.   Cardiovascular: Negative for chest pain, palpitations and leg swelling.  Gastrointestinal: Positive for diarrhea. Negative for nausea, abdominal pain, blood in stool and melena.  Genitourinary: Negative for dysuria.  Musculoskeletal: Positive for myalgias. Negative for falls.  Skin: Negative for rash.  Neurological: Positive for headaches. Negative for loss of  consciousness.  Endo/Heme/Allergies: Negative for environmental allergies.  Psychiatric/Behavioral: Negative for depression. The patient is not nervous/anxious.        Objective:    Physical Exam  Constitutional: She is oriented to person, place, and time. She appears well-developed and well-nourished. No distress.  HENT:  Head: Normocephalic and atraumatic.  Nose: Nose normal.  TMs are dull and retracted. Nasal mucosa boggy and erythematous.   Eyes: Right eye exhibits no  discharge. Left eye exhibits no discharge.  Neck: Normal range of motion. Neck supple.  Cardiovascular: Normal rate and regular rhythm.   No murmur heard. Pulmonary/Chest: Effort normal and breath sounds normal.  Abdominal: Soft. Bowel sounds are normal. There is no tenderness.  Musculoskeletal: She exhibits no edema.  Neurological: She is alert and oriented to person, place, and time.  Skin: Skin is warm and dry.  Psychiatric: She has a normal mood and affect.  Nursing note and vitals reviewed.   BP 142/100 mmHg  Pulse 93  Temp(Src) 98.1 F (36.7 C) (Oral)  Ht 5\' 6"  (1.676 m)  Wt 171 lb 8 oz (77.792 kg)  BMI 27.69 kg/m2  SpO2 92% Wt Readings from Last 3 Encounters:  12/23/15 171 lb 8 oz (77.792 kg)  10/21/15 175 lb 9.6 oz (79.652 kg)  10/18/15 172 lb (78.019 kg)     Lab Results  Component Value Date   WBC 3.9* 10/11/2015   HGB 12.9 10/11/2015   HCT 39.9 10/11/2015   PLT 168.0 10/11/2015   GLUCOSE 88 10/11/2015   CHOL 157 10/11/2015   TRIG 69.0 10/11/2015   HDL 54.20 10/11/2015   LDLCALC 89 10/11/2015   ALT 12 10/11/2015   AST 16 10/11/2015   NA 141 10/11/2015   K 3.9 10/11/2015   CL 105 10/11/2015   CREATININE 0.69 10/11/2015   BUN 16 10/11/2015   CO2 28 10/11/2015   TSH 1.20 10/11/2015    Lab Results  Component Value Date   TSH 1.20 10/11/2015   Lab Results  Component Value Date   WBC 3.9* 10/11/2015   HGB 12.9 10/11/2015   HCT 39.9 10/11/2015   MCV 89.9 10/11/2015   PLT  168.0 10/11/2015   Lab Results  Component Value Date   NA 141 10/11/2015   K 3.9 10/11/2015   CO2 28 10/11/2015   GLUCOSE 88 10/11/2015   BUN 16 10/11/2015   CREATININE 0.69 10/11/2015   BILITOT 0.7 10/11/2015   ALKPHOS 52 10/11/2015   AST 16 10/11/2015   ALT 12 10/11/2015   PROT 8.9* 10/11/2015   ALBUMIN 3.8 10/11/2015   CALCIUM 9.6 10/11/2015   GFR 111.10 10/11/2015   Lab Results  Component Value Date   CHOL 157 10/11/2015   Lab Results  Component Value Date   HDL 54.20 10/11/2015   Lab Results  Component Value Date   LDLCALC 89 10/11/2015   Lab Results  Component Value Date   TRIG 69.0 10/11/2015   Lab Results  Component Value Date   CHOLHDL 3 10/11/2015   No results found for: HGBA1C     Assessment & Plan:   Problem List Items Addressed This Visit    Depression    Father past away in Dec 04, 2022 after mother passed away in 08-05-23. She is managing with current meds no changes      GERD (gastroesophageal reflux disease)    Avoid offending foods, start probiotics. Do not eat large meals in late evening and consider raising head of bed.       Hyperlipidemia    Encouraged heart healthy diet, increase exercise, avoid trans fats, consider a krill oil cap daily      Relevant Medications   carvedilol (COREG) 25 MG tablet   Hypertension     Encouraged heart healthy diet such as the DASH diet and exercise as tolerated. Carvedilol increased to 25 mg po bid      Relevant Medications   carvedilol (COREG) 25 MG tablet  Insomnia - Primary   Relevant Medications   zolpidem (AMBIEN) 10 MG tablet   Sinusitis    Encouraged increased rest and hydration, add probiotics, zinc such as Coldeze or Xicam. Treat fevers as needed. Given rx for Doxycycline       Relevant Medications   doxycycline (VIBRA-TABS) 100 MG tablet      I have discontinued Ms. Reen's carvedilol. I am also having her start on doxycycline, carvedilol, and zolpidem. Additionally, I am having  her maintain her docusate sodium, Vitamin D3, Multiple Vitamins-Minerals (CENTRUM SILVER ADULT 50+ PO), butalbital-acetaminophen-caffeine, potassium chloride SA, pantoprazole, furosemide, escitalopram, benazepril, amLODipine, fluticasone, hyoscyamine, meclizine, modafinil, ondansetron, promethazine, sucralfate, fluconazole, ibuprofen, metroNIDAZOLE, naproxen, alendronate, ergocalciferol, and tiZANidine.  Meds ordered this encounter  Medications  . doxycycline (VIBRA-TABS) 100 MG tablet    Sig: Take 1 tablet (100 mg total) by mouth 2 (two) times daily.    Dispense:  20 tablet    Refill:  0  . carvedilol (COREG) 25 MG tablet    Sig: Take 1 tablet (25 mg total) by mouth 2 (two) times daily with a meal.    Dispense:  60 tablet    Refill:  3  . zolpidem (AMBIEN) 10 MG tablet    Sig: Take 0.5-1 tablets (5-10 mg total) by mouth at bedtime as needed for sleep.    Dispense:  30 tablet    Refill:  1     Penni Homans, MD

## 2015-12-29 NOTE — Assessment & Plan Note (Signed)
Avoid offending foods, start probiotics. Do not eat large meals in late evening and consider raising head of bed.  

## 2015-12-31 ENCOUNTER — Telehealth: Payer: Self-pay | Admitting: Family Medicine

## 2015-12-31 NOTE — Telephone Encounter (Signed)
Please print her MAR and her last note

## 2015-12-31 NOTE — Telephone Encounter (Signed)
Caller name:Tami [name] Relationship to patient:self Can be reached:[phone] Pharmacy:  Reason for call:Patient is appealing a denial for social services to pay for her medicines.  She needs Dr Charlett Blake to write a letter saying the medicine she is taking and why she needs the meds.

## 2015-12-31 NOTE — Telephone Encounter (Signed)
Printed and on counter as requested by PCP

## 2016-01-24 ENCOUNTER — Ambulatory Visit: Payer: 59 | Admitting: Family Medicine

## 2016-01-29 ENCOUNTER — Encounter: Payer: 59 | Admitting: Medical

## 2016-01-29 NOTE — Progress Notes (Signed)
   Subjective:    [name], female    DOB: 1955-12-19, 60 y.o.   MRN: [id number]  HPI    Review of Systems     Objective:   Physical Exam        Assessment & Plan:   This encounter was created in error - please disregard.

## 2016-01-30 ENCOUNTER — Telehealth: Payer: Self-pay | Admitting: Medical

## 2016-01-30 NOTE — Telephone Encounter (Signed)
Pt was no show 01/29/16 9:30am for hospital f/u, pt has not rescheduled, charge or no charge?

## 2016-01-30 NOTE — Telephone Encounter (Signed)
No show charge 

## 2016-01-31 ENCOUNTER — Encounter: Payer: Self-pay | Admitting: Medical

## 2016-01-31 NOTE — Telephone Encounter (Signed)
Marked to charge and mailing no show letter °

## 2016-04-03 ENCOUNTER — Telehealth: Payer: Self-pay | Admitting: Family Medicine

## 2016-04-03 NOTE — Telephone Encounter (Signed)
Pt called in because she has her CPE scheduled for 6/2. She received call stating that PCP will be out of office. [place] She is only coming in town for her Dr. appt's she says that she has them all scheduled before she leaves on 6/6. Pt would like to know if PCP would be able to "fit her in" so that she can still have her CPE while she's in town?    CB: [phone]

## 2016-04-03 NOTE — Telephone Encounter (Signed)
Good news my meeting on 6/1 is canceled so we can move her to that day if it works for her I can start seeing patients at 8:15, I have a short meeting. We also have to move patients from 6/2 and 6/5 still. Those are the last 2 candidates for [facility] president. I can start at 9 on 6/2 and 9:15 on 6/5. We can use the time 6/1 hopefully

## 2016-04-08 NOTE — Telephone Encounter (Signed)
error 

## 2016-04-09 ENCOUNTER — Ambulatory Visit (INDEPENDENT_AMBULATORY_CARE_PROVIDER_SITE_OTHER): Payer: Medicaid Other | Admitting: Family Medicine

## 2016-04-09 ENCOUNTER — Encounter: Payer: Self-pay | Admitting: Family Medicine

## 2016-04-09 VITALS — BP 138/94 | HR 96 | Temp 97.6°F | Ht 66.0 in | Wt 176.1 lb

## 2016-04-09 DIAGNOSIS — R0602 Shortness of breath: Secondary | ICD-10-CM

## 2016-04-09 DIAGNOSIS — I10 Essential (primary) hypertension: Secondary | ICD-10-CM

## 2016-04-09 DIAGNOSIS — K2100 Gastro-esophageal reflux disease with esophagitis, without bleeding: Secondary | ICD-10-CM

## 2016-04-09 DIAGNOSIS — J029 Acute pharyngitis, unspecified: Secondary | ICD-10-CM

## 2016-04-09 DIAGNOSIS — K219 Gastro-esophageal reflux disease without esophagitis: Secondary | ICD-10-CM | POA: Diagnosis not present

## 2016-04-09 HISTORY — DX: Shortness of breath: R06.02

## 2016-04-09 LAB — CBC
HEMATOCRIT: 40.1 % (ref 36.0–46.0)
Hemoglobin: 13.2 g/dL (ref 12.0–15.0)
MCHC: 33 g/dL (ref 30.0–36.0)
MCV: 88.4 fl (ref 78.0–100.0)
Platelets: 171 10*3/uL (ref 150.0–400.0)
RBC: 4.53 Mil/uL (ref 3.87–5.11)
RDW: 14.2 % (ref 11.5–15.5)
WBC: 5.2 10*3/uL (ref 4.0–10.5)

## 2016-04-09 LAB — COMPREHENSIVE METABOLIC PANEL
ALT: 14 U/L (ref 0–35)
AST: 14 U/L (ref 0–37)
Albumin: 3.7 g/dL (ref 3.5–5.2)
Alkaline Phosphatase: 41 U/L (ref 39–117)
BILIRUBIN TOTAL: 0.4 mg/dL (ref 0.2–1.2)
BUN: 24 mg/dL — AB (ref 6–23)
CO2: 30 meq/L (ref 19–32)
CREATININE: 0.73 mg/dL (ref 0.40–1.20)
Calcium: 9.2 mg/dL (ref 8.4–10.5)
Chloride: 105 mEq/L (ref 96–112)
GFR: 103.94 mL/min (ref >60.00)
GLUCOSE: 91 mg/dL (ref 70–99)
Potassium: 3.5 mEq/L (ref 3.5–5.1)
Sodium: 140 mEq/L (ref 135–145)
TOTAL PROTEIN: 8.5 g/dL — AB (ref 6.0–8.3)

## 2016-04-09 LAB — LIPID PANEL
CHOLESTEROL: 170 mg/dL (ref 0–200)
HDL: 59.1 mg/dL (ref >39.00)
LDL CALC: 97 mg/dL (ref 0–99)
NonHDL: 111.27
TRIGLYCERIDES: 72 mg/dL (ref 0.0–149.0)
Total CHOL/HDL Ratio: 3
VLDL: 14.4 mg/dL (ref 0.0–40.0)

## 2016-04-09 LAB — H. PYLORI ANTIBODY, IGG: H Pylori IgG: NEGATIVE

## 2016-04-09 LAB — TSH: TSH: 1.94 u[IU]/mL (ref 0.35–4.50)

## 2016-04-09 MED ORDER — MECLIZINE HCL 25 MG PO TABS
25.0000 mg | ORAL_TABLET | Freq: Three times a day (TID) | ORAL | Status: DC | PRN
Start: 2016-04-09 — End: 2017-06-11

## 2016-04-09 MED ORDER — CEFDINIR 300 MG PO CAPS: 300.0000 mg | ORAL_CAPSULE | Freq: Two times a day (BID) | ORAL | Status: DC

## 2016-04-09 MED ORDER — PREDNISONE 20 MG PO TABS: 20.0000 mg | ORAL_TABLET | Freq: Two times a day (BID) | ORAL | Status: DC

## 2016-04-09 MED ORDER — CARVEDILOL 25 MG PO TABS: 25.0000 mg | ORAL_TABLET | Freq: Two times a day (BID) | ORAL | Status: DC

## 2016-04-09 MED ORDER — ERGOCALCIFEROL 1.25 MG (50000 UT) PO CAPS: 50000.0000 [IU] | ORAL_CAPSULE | ORAL | Status: DC

## 2016-04-09 MED ORDER — POTASSIUM CHLORIDE CRYS ER 20 MEQ PO TBCR: 20.0000 meq | EXTENDED_RELEASE_TABLET | Freq: Every day | ORAL | Status: DC

## 2016-04-09 MED ORDER — ALENDRONATE SODIUM 70 MG PO TABS: 70.0000 mg | ORAL_TABLET | ORAL | Status: DC

## 2016-04-09 MED ORDER — ALBUTEROL SULFATE HFA 108 (90 BASE) MCG/ACT IN AERS: 2.0000 | INHALATION_SPRAY | Freq: Four times a day (QID) | RESPIRATORY_TRACT | Status: DC | PRN

## 2016-04-09 MED ORDER — CEFDINIR 300 MG PO CAPS: 300.0000 mg | ORAL_CAPSULE | Freq: Two times a day (BID) | ORAL | Status: AC

## 2016-04-09 MED ORDER — PANTOPRAZOLE SODIUM 40 MG PO TBEC: 40.0000 mg | DELAYED_RELEASE_TABLET | Freq: Two times a day (BID) | ORAL | Status: DC

## 2016-04-09 NOTE — Assessment & Plan Note (Addendum)
Poorly controlled, no changes to meds secondary to patient acutely ill. Encouraged heart healthy diet such as the DASH diet and exercise as tolerated.

## 2016-04-09 NOTE — Progress Notes (Signed)
Pre visit review using our clinic review tool, if applicable. No additional management support is needed unless otherwise documented below in the visit note. 

## 2016-04-09 NOTE — Assessment & Plan Note (Addendum)
Has an appt with Dr Lajoyce Corners [facility] today, is struggling with some epigastric pain and nausea presently. Encouraged probiotics daily. H Pylori negative.

## 2016-04-09 NOTE — Patient Instructions (Signed)
Hypertension Hypertension, commonly called high blood pressure, is when the force of blood pumping through your arteries is too strong. Your arteries are the blood vessels that carry blood from your heart throughout your body. A blood pressure reading consists of a higher number over a lower number, such as 110/72. The higher number (systolic) is the pressure inside your arteries when your heart pumps. The lower number (diastolic) is the pressure inside your arteries when your heart relaxes. Ideally you want your blood pressure below 120/80. Hypertension forces your heart to work harder to pump blood. Your arteries may become narrow or stiff. Having untreated or uncontrolled hypertension can cause heart attack, stroke, kidney disease, and other problems. RISK FACTORS Some risk factors for high blood pressure are controllable. Others are not.  Risk factors you cannot control include:   Race. You may be at higher risk if you are African American.  Age. Risk increases with age.  Gender. Men are at higher risk than women before age 45 years. After age 65, women are at higher risk than men. Risk factors you can control include:  Not getting enough exercise or physical activity.  Being overweight.  Getting too much fat, sugar, calories, or salt in your diet.  Drinking too much alcohol. SIGNS AND SYMPTOMS Hypertension does not usually cause signs or symptoms. Extremely high blood pressure (hypertensive crisis) may cause headache, anxiety, shortness of breath, and nosebleed. DIAGNOSIS To check if you have hypertension, your health care provider will measure your blood pressure while you are seated, with your arm held at the level of your heart. It should be measured at least twice using the same arm. Certain conditions can cause a difference in blood pressure between your right and left arms. A blood pressure reading that is higher than normal on one occasion does not mean that you need treatment. If  it is not clear whether you have high blood pressure, you may be asked to return on a different day to have your blood pressure checked again. Or, you may be asked to monitor your blood pressure at home for 1 or more weeks. TREATMENT Treating high blood pressure includes making lifestyle changes and possibly taking medicine. Living a healthy lifestyle can help lower high blood pressure. You may need to change some of your habits. Lifestyle changes may include:  Following the DASH diet. This diet is high in fruits, vegetables, and whole grains. It is low in salt, red meat, and added sugars.  Keep your sodium intake below 2,300 mg per day.  Getting at least 30-45 minutes of aerobic exercise at least 4 times per week.  Losing weight if necessary.  Not smoking.  Limiting alcoholic beverages.  Learning ways to reduce stress. Your health care provider may prescribe medicine if lifestyle changes are not enough to get your blood pressure under control, and if one of the following is true:  You are 18-59 years of age and your systolic blood pressure is above 140.  You are 60 years of age or older, and your systolic blood pressure is above 150.  Your diastolic blood pressure is above 90.  You have diabetes, and your systolic blood pressure is over 140 or your diastolic blood pressure is over 90.  You have kidney disease and your blood pressure is above 140/90.  You have heart disease and your blood pressure is above 140/90. Your personal target blood pressure may vary depending on your medical conditions, your age, and other factors. HOME CARE INSTRUCTIONS    Have your blood pressure rechecked as directed by your health care provider.   Take medicines only as directed by your health care provider. Follow the directions carefully. Blood pressure medicines must be taken as prescribed. The medicine does not work as well when you skip doses. Skipping doses also puts you at risk for  problems.  Do not smoke.   Monitor your blood pressure at home as directed by your health care provider. SEEK MEDICAL CARE IF:   You think you are having a reaction to medicines taken.  You have recurrent headaches or feel dizzy.  You have swelling in your ankles.  You have trouble with your vision. SEEK IMMEDIATE MEDICAL CARE IF:  You develop a severe headache or confusion.  You have unusual weakness, numbness, or feel faint.  You have severe chest or abdominal pain.  You vomit repeatedly.  You have trouble breathing. MAKE SURE YOU:   Understand these instructions.  Will watch your condition.  Will get help right away if you are not doing well or get worse.   This information is not intended to replace advice given to you by your health care provider. Make sure you discuss any questions you have with your health care provider.   Document Released: 10/26/2005 Document Revised: 03/12/2015 Document Reviewed: 08/18/2013 [employer] Interactive Patient Education 2016 [employer]  

## 2016-04-10 ENCOUNTER — Ambulatory Visit: Payer: PRIVATE HEALTH INSURANCE | Admitting: Family Medicine

## 2016-04-14 ENCOUNTER — Telehealth: Payer: Self-pay | Admitting: Family Medicine

## 2016-04-14 NOTE — Telephone Encounter (Signed)
Ok to rx Silvadene cream. Apply daily to burns as needed. Disp #1 tube with 1 rf

## 2016-04-14 NOTE — Telephone Encounter (Signed)
Can be reached: [phone] Pharmacy: [facility] [address] [place] [address]  Reason for call: Pt asking for refill on silvadene creme. She is in catering and uses it for burns. She has a burn on her arm now.

## 2016-04-14 NOTE — Telephone Encounter (Signed)
Advise do not see on her current list.

## 2016-04-15 ENCOUNTER — Other Ambulatory Visit: Payer: Self-pay

## 2016-04-15 MED ORDER — SILVER SULFADIAZINE 1 % EX CREA: 1.0000 "application " | TOPICAL_CREAM | Freq: Every day | CUTANEOUS | Status: DC

## 2016-04-15 NOTE — Telephone Encounter (Signed)
Medication ordered

## 2016-04-16 ENCOUNTER — Ambulatory Visit: Payer: Self-pay | Admitting: Pulmonary Disease

## 2016-04-19 DIAGNOSIS — J029 Acute pharyngitis, unspecified: Secondary | ICD-10-CM | POA: Insufficient documentation

## 2016-04-19 NOTE — Assessment & Plan Note (Signed)
Likely viral, Encouraged increased rest and hydration, add probiotics, zinc such as Coldeze or Xicam. Treat fevers as needed

## 2016-04-19 NOTE — Progress Notes (Signed)
[name], female   DOB: [birth year], 60 y.o.   MRN: [id number]   Subjective:    [name], female    DOB: [birth year], 60 y.o.   MRN: [id number]  Chief Complaint  Patient presents with  . Follow-up    HPI Patient is in today for follow up and is not feeling well, she has an appointment with [facility] later today for dyspepsia and worsening nausea. She is also noting several days of dry cough, sore throat, dizziness and ears feeling pressure. She also has an appointment with her cardiologist at [facility] Ola Spurr soon. Finally she endorses an intermittent headache. No diarrhea or vomiting. Denies CP/palp/congestion or GU c/o. Taking meds as prescribed  Past Medical History  Diagnosis Date  . CHF (congestive heart failure) ([place])   . Bowel obstruction ([place]) 11/2014  . GERD (gastroesophageal reflux disease)   . Arthritis     knees, hands  . Hyperlipidemia   . Hypertension   . Depression   . Stroke ([place])     TIAs  . Congestive heart failure ([place]) 02/24/2015    S/p pacemaker  . Obstructive sleep apnea 03/31/2015  . SOB (shortness of breath) 04/09/2016    Past Surgical History  Procedure Laterality Date  . Pacemaker insertion  08/2014  . Cholecystectomy    . Tubal ligation    . Knee surgery      right, repair torn torn cartialage  . Abdominal hysterectomy      menorraghia, 2006, total  . Tonsillectomy      Family History  Problem Relation Age of Onset  . Diabetes Mother   . Hypertension Mother   . Heart disease Mother     s/p 1 stent  . Hyperlipidemia Mother   . Arthritis Mother   . Cancer Father     COLON  . Colon cancer Father 25  . Irritable bowel syndrome Sister   . Hyperlipidemia Daughter   . Hypertension Daughter   . Hypertension Maternal Grandmother   . Arthritis Maternal Grandmother   . Heart disease Maternal Grandfather     MI  . Hypertension Maternal Grandfather   . Arthritis Maternal Grandfather   .  Hypertension Son     Social History   Social History  . Marital Status: Married    Spouse Name: N/A  . Number of Children: N/A  . Years of Education: N/A   Occupational History  . [name].   Social History Main Topics  . Smoking status: Never Smoker   . Smokeless tobacco: [name]  . Alcohol Use: No  . Drug Use: No  . Sexual Activity: No     Comment: Lives alone, no dietary restrictions   Other Topics Concern  . [name]   Social History Narrative    Outpatient Prescriptions Prior to Visit  Medication Sig Dispense Refill  . amLODipine (NORVASC) 5 MG tablet Take 1 tablet (5 mg total) by mouth daily. 90 tablet 2  . benazepril (LOTENSIN) 20 MG tablet Take 1 tablet (20 mg total) by mouth 2 (two) times daily. 180 tablet 1  . butalbital-acetaminophen-caffeine (FIORICET) 50-325-40 MG tablet Take by mouth.    . Cholecalciferol (VITAMIN D3) 5000 UNITS CAPS Take 1 capsule by mouth daily.    Marland Kitchen docusate sodium (COLACE) 50 MG capsule Take 50 mg by mouth 2 (two) times daily.    Marland Kitchen escitalopram (LEXAPRO) 20 MG tablet Take 1 tablet (20 mg total) by mouth daily.  90 tablet 1  . fluconazole (DIFLUCAN) 150 MG tablet Take 1 tablet (150 mg total) by mouth once. 1 tablet 0  . fluticasone (FLONASE) 50 MCG/ACT nasal spray Place 2 sprays into both nostrils daily. 48 g 1  . furosemide (LASIX) 20 MG tablet Take 1 tablet (20 mg total) by mouth daily. 90 tablet 1  . hyoscyamine (LEVSIN SL) 0.125 MG SL tablet Place 1 tablet (0.125 mg total) under the tongue every 4 (four) hours as needed. 90 tablet 01  . ibuprofen (ADVIL,MOTRIN) 600 MG tablet Take 1 tablet (600 mg total) by mouth 2 (two) times daily with a meal. 60 tablet 3  . metroNIDAZOLE (FLAGYL) 500 MG tablet Take 1 tablet (500 mg total) by mouth 2 (two) times daily. Take for 5 days 10 tablet 0  . modafinil (PROVIGIL) 100 MG tablet Take 1 tablet (100 mg total) by mouth daily. 30 tablet 5  . Multiple Vitamins-Minerals (CENTRUM SILVER ADULT  50+ PO) Take by mouth.    . naproxen (NAPROSYN) 500 MG tablet Take 1 tablet (500 mg total) by mouth 2 (two) times daily with a meal. 30 tablet 0  . ondansetron (ZOFRAN) 4 MG tablet Take 1 tablet (4 mg total) by mouth every 8 (eight) hours as needed for nausea or vomiting. 90 tablet 1  . promethazine (PHENERGAN) 25 MG tablet Take 1 tablet (25 mg total) by mouth every 6 (six) hours as needed for nausea or vomiting. 60 tablet 5  . sucralfate (CARAFATE) 1 G tablet Take 1 tablet (1 g total) by mouth 2 (two) times daily. 180 tablet 1  . tiZANidine (ZANAFLEX) 4 MG tablet TAKE ONE TABLET BY MOUTH EVERY 6 HOURS AS NEEDED FOR MUSCLE SPASM 30 tablet 0  . alendronate (FOSAMAX) 70 MG tablet Take 1 tablet (70 mg total) by mouth once a week. Take with a full glass of water on an empty stomach. 12 tablet 3  . carvedilol (COREG) 25 MG tablet Take 1 tablet (25 mg total) by mouth 2 (two) times daily with a meal. 60 tablet 3  . doxycycline (VIBRA-TABS) 100 MG tablet Take 1 tablet (100 mg total) by mouth 2 (two) times daily. 20 tablet 0  . ergocalciferol (VITAMIN D2) 50000 UNITS capsule Take 1 capsule (50,000 Units total) by mouth once a week. 12 capsule 1  . meclizine (ANTIVERT) 12.5 MG tablet Take 1 tablet (12.5 mg total) by mouth 3 (three) times daily as needed for dizziness. 60 tablet 1  . pantoprazole (PROTONIX) 40 MG tablet Take 1 tablet (40 mg total) by mouth 2 (two) times daily. 180 tablet 1  . potassium chloride SA (K-DUR,KLOR-CON) 20 MEQ tablet Take 1 tablet (20 mEq total) by mouth daily. 90 tablet 1  . zolpidem (AMBIEN) 10 MG tablet Take 0.5-1 tablets (5-10 mg total) by mouth at bedtime as needed for sleep. 30 tablet 1   No facility-administered medications prior to visit.    Allergies  Allergen Reactions  . Codeine Nausea And Vomiting    Review of Systems  Constitutional: Positive for fever. Negative for malaise/fatigue.  HENT: Positive for sore throat. Negative for congestion.   Eyes: Negative for  blurred vision.  Respiratory: Negative for shortness of breath.   Cardiovascular: Negative for chest pain, palpitations and leg swelling.  Gastrointestinal: Positive for heartburn and nausea. Negative for abdominal pain and blood in stool.  Genitourinary: Negative for dysuria and frequency.  Musculoskeletal: Negative for falls.  Skin: Negative for rash.  Neurological: Positive for dizziness and headaches.  Negative for loss of consciousness.  Endo/Heme/Allergies: Negative for environmental allergies.  Psychiatric/Behavioral: Negative for depression. The patient is not nervous/anxious.        Objective:    Physical Exam  Constitutional: She is oriented to person, place, and time. She appears well-developed and well-nourished. No distress.  HENT:  Head: Normocephalic and atraumatic.  Nose: Nose normal.  Oropharynx erythematous  Eyes: Right eye exhibits no discharge. Left eye exhibits no discharge.  Neck: Normal range of motion. Neck supple.  Cardiovascular: Normal rate and regular rhythm.   No murmur heard. Pulmonary/Chest: Effort normal and breath sounds normal.  Abdominal: Soft. Bowel sounds are normal. There is no tenderness.  Musculoskeletal: She exhibits no edema.  Neurological: She is alert and oriented to person, place, and time.  Skin: Skin is warm and dry.  Psychiatric: She has a normal mood and affect.  Nursing note and vitals reviewed.   BP 138/94 mmHg  Pulse 96  Temp(Src) 97.6 F (36.4 C) (Oral)  Ht 5\' 6"  (1.676 m)  Wt 176 lb 2 oz (79.89 kg)  BMI 28.44 kg/m2  SpO2 97% Wt Readings from Last 3 Encounters:  04/09/16 176 lb 2 oz (79.89 kg)  12/23/15 171 lb 8 oz (77.792 kg)  10/21/15 175 lb 9.6 oz (79.652 kg)     Lab Results  Component Value Date   WBC 5.2 04/09/2016   HGB 13.2 04/09/2016   HCT 40.1 04/09/2016   PLT 171.0 04/09/2016   GLUCOSE 91 04/09/2016   CHOL 170 04/09/2016   TRIG 72.0 04/09/2016   HDL 59.10 04/09/2016   LDLCALC 97 04/09/2016   ALT  14 04/09/2016   AST 14 04/09/2016   NA 140 04/09/2016   K 3.5 04/09/2016   CL 105 04/09/2016   CREATININE 0.73 04/09/2016   BUN 24* 04/09/2016   CO2 30 04/09/2016   TSH 1.94 04/09/2016    Lab Results  Component Value Date   TSH 1.94 04/09/2016   Lab Results  Component Value Date   WBC 5.2 04/09/2016   HGB 13.2 04/09/2016   HCT 40.1 04/09/2016   MCV 88.4 04/09/2016   PLT 171.0 04/09/2016   Lab Results  Component Value Date   NA 140 04/09/2016   K 3.5 04/09/2016   CO2 30 04/09/2016   GLUCOSE 91 04/09/2016   BUN 24* 04/09/2016   CREATININE 0.73 04/09/2016   BILITOT 0.4 04/09/2016   ALKPHOS 41 04/09/2016   AST 14 04/09/2016   ALT 14 04/09/2016   PROT 8.5* 04/09/2016   ALBUMIN 3.7 04/09/2016   CALCIUM 9.2 04/09/2016   GFR 103.94 04/09/2016   Lab Results  Component Value Date   CHOL 170 04/09/2016   Lab Results  Component Value Date   HDL 59.10 04/09/2016   Lab Results  Component Value Date   LDLCALC 97 04/09/2016   Lab Results  Component Value Date   TRIG 72.0 04/09/2016   Lab Results  Component Value Date   CHOLHDL 3 04/09/2016   No results found for: HGBA1C     Assessment & Plan:   Problem List Items Addressed This Visit    Hypertension - Primary    Poorly controlled, no changes to meds secondary to patient acutely ill. Encouraged heart healthy diet such as the DASH diet and exercise as tolerated.       Relevant Medications   carvedilol (COREG) 25 MG tablet   Other Relevant Orders   TSH (Completed)   CBC (Completed)   Lipid panel (Completed)  Comprehensive metabolic panel (Completed)   H. pylori antibody, IgG (Completed)   GERD (gastroesophageal reflux disease)    Has an appt with Dr Lajoyce [facility] today, is struggling with some epigastric pain and nausea presently. Encouraged probiotics daily. H Pylori negative.      Relevant Medications   pantoprazole (PROTONIX) 40 MG tablet   meclizine (ANTIVERT) 25 MG tablet   Other Relevant Orders    TSH (Completed)   CBC (Completed)   Lipid panel (Completed)   Comprehensive metabolic panel (Completed)   H. pylori antibody, IgG (Completed)   TSH (Completed)   CBC (Completed)   Lipid panel (Completed)   Comprehensive metabolic panel (Completed)   H. pylori antibody, IgG (Completed)   Acute pharyngitis    Likely viral, Encouraged increased rest and hydration, add probiotics, zinc such as Coldeze or Xicam. Treat fevers as needed      Relevant Orders   TSH (Completed)   CBC (Completed)   Lipid panel (Completed)   Comprehensive metabolic panel (Completed)   H. pylori antibody, IgG (Completed)      I have discontinued Ms. Koopman's meclizine and doxycycline. I am also having her start on meclizine, predniSONE, and albuterol. Additionally, I am having her maintain her docusate sodium, Vitamin D3, Multiple Vitamins-Minerals (CENTRUM SILVER ADULT 50+ PO), butalbital-acetaminophen-caffeine, furosemide, escitalopram, benazepril, amLODipine, fluticasone, hyoscyamine, modafinil, ondansetron, promethazine, sucralfate, fluconazole, ibuprofen, metroNIDAZOLE, naproxen, tiZANidine, zolpidem, pantoprazole, alendronate, ergocalciferol, potassium chloride SA, carvedilol, and cefdinir.  Meds ordered this encounter  Medications  . pantoprazole (PROTONIX) 40 MG tablet    Sig: Take 1 tablet (40 mg total) by mouth 2 (two) times daily.    Dispense:  180 tablet    Refill:  1  . alendronate (FOSAMAX) 70 MG tablet    Sig: Take 1 tablet (70 mg total) by mouth once a week. Take with a full glass of water on an empty stomach.    Dispense:  12 tablet    Refill:  3    D/C PREVIOUS SCRIPTS FOR THIS MEDICATION  . ergocalciferol (VITAMIN D2) 50000 units capsule    Sig: Take 1 capsule (50,000 Units total) by mouth once a week.    Dispense:  12 capsule    Refill:  1    D/C PREVIOUS SCRIPTS FOR THIS MEDICATION  . potassium chloride SA (K-DUR,KLOR-CON) 20 MEQ tablet    Sig: Take 1 tablet (20 mEq total) by mouth  daily.    Dispense:  90 tablet    Refill:  1  . meclizine (ANTIVERT) 25 MG tablet    Sig: Take 1 tablet (25 mg total) by mouth 3 (three) times daily as needed for dizziness.    Dispense:  30 tablet    Refill:  0  . DISCONTD: cefdinir (OMNICEF) 300 MG capsule    Sig: Take 1 capsule (300 mg total) by mouth 2 (two) times daily.    Dispense:  20 capsule    Refill:  0  . predniSONE (DELTASONE) 20 MG tablet    Sig: Take 1 tablet (20 mg total) by mouth 2 (two) times daily with a meal.    Dispense:  10 tablet    Refill:  0  . albuterol (PROVENTIL HFA;VENTOLIN HFA) 108 (90 Base) MCG/ACT inhaler    Sig: Inhale 2 puffs into the lungs every 6 (six) hours as needed for wheezing or shortness of breath.    Dispense:  1 Inhaler    Refill:  1  . carvedilol (COREG) 25 MG tablet  Sig: Take 1 tablet (25 mg total) by mouth 2 (two) times daily with a meal.    Dispense:  180 tablet    Refill:  1  . cefdinir (OMNICEF) 300 MG capsule    Sig: Take 1 capsule (300 mg total) by mouth 2 (two) times daily.    Dispense:  20 capsule    Refill:  0     Penni Homans, MD

## 2016-06-11 ENCOUNTER — Telehealth: Payer: Self-pay | Admitting: Family Medicine

## 2016-06-11 NOTE — Telephone Encounter (Signed)
Spoke with pt. Pt rescheduled her appt until 9/5. Pt says that she is just scared to be late to her other appt. Pt is from out of town and says that her last visit she received a traffic ticket getting from one appt to the next.

## 2016-06-11 NOTE — Telephone Encounter (Signed)
Pt is due for follow up per last note from PCP. I would think a 7:45 appt here (which is first appt of the day) would leave her plenty of time to get to an 11:00 appt at another office, however I do not know where her other appt is. If pt still feels like she needs to reschedule, that is up to her.

## 2016-06-11 NOTE — Telephone Encounter (Signed)
Pt called in because she is scheduled with PCP tomorrow morning at 7:45. Pt says that she also has another appt at 11:00 at a different office. Pt  Is nervous that she may miss or be late to her 11:00 appt. Pt would like to know if this F/U is needed or could she reschedule appt to about a month out with out any concerns?    Please advise. I will call the pt back   [phone]

## 2016-06-12 ENCOUNTER — Ambulatory Visit: Payer: Self-pay | Admitting: Family Medicine

## 2016-07-14 ENCOUNTER — Ambulatory Visit (INDEPENDENT_AMBULATORY_CARE_PROVIDER_SITE_OTHER): Payer: Medicaid Other | Admitting: Family Medicine

## 2016-07-14 ENCOUNTER — Encounter: Payer: Self-pay | Admitting: Family Medicine

## 2016-07-14 VITALS — BP 140/92 | HR 82 | Temp 97.9°F | Ht 66.0 in | Wt 183.1 lb

## 2016-07-14 DIAGNOSIS — R778 Other specified abnormalities of plasma proteins: Secondary | ICD-10-CM

## 2016-07-14 DIAGNOSIS — I509 Heart failure, unspecified: Secondary | ICD-10-CM

## 2016-07-14 DIAGNOSIS — M545 Low back pain, unspecified: Secondary | ICD-10-CM | POA: Insufficient documentation

## 2016-07-14 DIAGNOSIS — E785 Hyperlipidemia, unspecified: Secondary | ICD-10-CM

## 2016-07-14 DIAGNOSIS — Z23 Encounter for immunization: Secondary | ICD-10-CM

## 2016-07-14 DIAGNOSIS — M549 Dorsalgia, unspecified: Secondary | ICD-10-CM | POA: Diagnosis not present

## 2016-07-14 DIAGNOSIS — R0602 Shortness of breath: Secondary | ICD-10-CM

## 2016-07-14 DIAGNOSIS — R769 Abnormal immunological finding in serum, unspecified: Secondary | ICD-10-CM

## 2016-07-14 DIAGNOSIS — J90 Pleural effusion, not elsewhere classified: Secondary | ICD-10-CM

## 2016-07-14 DIAGNOSIS — I10 Essential (primary) hypertension: Secondary | ICD-10-CM | POA: Diagnosis not present

## 2016-07-14 DIAGNOSIS — K219 Gastro-esophageal reflux disease without esophagitis: Secondary | ICD-10-CM

## 2016-07-14 HISTORY — DX: Low back pain, unspecified: M54.50

## 2016-07-14 HISTORY — DX: Dorsalgia, unspecified: M54.9

## 2016-07-14 LAB — COMPREHENSIVE METABOLIC PANEL
ALT: 24 U/L (ref 0–35)
AST: 31 U/L (ref 0–37)
Albumin: 3.7 g/dL (ref 3.5–5.2)
Alkaline Phosphatase: 43 U/L (ref 39–117)
BUN: 18 mg/dL (ref 6–23)
CHLORIDE: 107 meq/L (ref 96–112)
CO2: 26 mEq/L (ref 19–32)
Calcium: 8.9 mg/dL (ref 8.4–10.5)
Creatinine, Ser: 0.74 mg/dL (ref 0.40–1.20)
GFR: 102.23 mL/min (ref >60.00)
GLUCOSE: 92 mg/dL (ref 70–99)
POTASSIUM: 3.7 meq/L (ref 3.5–5.1)
SODIUM: 139 meq/L (ref 135–145)
Total Bilirubin: 0.4 mg/dL (ref 0.2–1.2)
Total Protein: 8.5 g/dL — ABNORMAL HIGH (ref 6.0–8.3)

## 2016-07-14 LAB — CBC
HEMATOCRIT: 36.2 % (ref 36.0–46.0)
Hemoglobin: 12.1 g/dL (ref 12.0–15.0)
MCHC: 33.4 g/dL (ref 30.0–36.0)
MCV: 88.2 fl (ref 78.0–100.0)
Platelets: 131 10*3/uL — ABNORMAL LOW (ref 150.0–400.0)
RBC: 4.1 Mil/uL (ref 3.87–5.11)
RDW: 13.9 % (ref 11.5–15.5)
WBC: 3.3 10*3/uL — ABNORMAL LOW (ref 4.0–10.5)

## 2016-07-14 LAB — TSH: TSH: 1.31 u[IU]/mL (ref 0.35–4.50)

## 2016-07-14 MED ORDER — AMOXICILLIN 500 MG PO CAPS: 500.0000 mg | ORAL_CAPSULE | Freq: Three times a day (TID) | ORAL | 0 refills | Status: DC

## 2016-07-14 MED ORDER — BACLOFEN 20 MG PO TABS: 20.0000 mg | ORAL_TABLET | Freq: Two times a day (BID) | ORAL | 2 refills | Status: DC | PRN

## 2016-07-14 MED ORDER — BACLOFEN 20 MG PO TABS: 20.0000 mg | ORAL_TABLET | Freq: Three times a day (TID) | ORAL | 2 refills | Status: DC | PRN

## 2016-07-14 MED ORDER — GABAPENTIN 300 MG PO CAPS: 300.0000 mg | ORAL_CAPSULE | Freq: Three times a day (TID) | ORAL | 3 refills | Status: DC

## 2016-07-14 NOTE — Progress Notes (Signed)
Pre visit review using our clinic review tool, if applicable. No additional management support is needed unless otherwise documented below in the visit note. 

## 2016-07-14 NOTE — Patient Instructions (Signed)
  ASK [facility] about Ibuprofen use and your heart Probiotic such as Digestive Advantage or [facility]   Pharyngitis Pharyngitis is redness, pain, and swelling (inflammation) of your pharynx.  CAUSES  Pharyngitis is usually caused by infection. Most of the time, these infections are from viruses (viral) and are part of a cold. However, sometimes pharyngitis is caused by bacteria (bacterial). Pharyngitis can also be caused by allergies. Viral pharyngitis may be spread from person to person by coughing, sneezing, and personal items or utensils (cups, forks, spoons, toothbrushes). Bacterial pharyngitis may be spread from person to person by more intimate contact, such as kissing.  SIGNS AND SYMPTOMS  Symptoms of pharyngitis include:   Sore throat.   Tiredness (fatigue).   Low-grade fever.   Headache.  Joint pain and muscle aches.  Skin rashes.  Swollen lymph nodes.  Plaque-like film on throat or tonsils (often seen with bacterial pharyngitis). DIAGNOSIS  Your health care provider will ask you questions about your illness and your symptoms. Your medical history, along with a physical exam, is often all that is needed to diagnose pharyngitis. Sometimes, a rapid strep test is done. Other lab tests may also be done, depending on the suspected cause.  TREATMENT  Viral pharyngitis will usually get better in 3-4 days without the use of medicine. Bacterial pharyngitis is treated with medicines that kill germs (antibiotics).  HOME CARE INSTRUCTIONS   Drink enough water and fluids to keep your urine clear or pale yellow.   Only take over-the-counter or prescription medicines as directed by your health care provider:   If you are prescribed antibiotics, make sure you finish them even if you start to feel better.   Do not take aspirin.   Get lots of rest.   Gargle with 8 oz of salt water ( tsp of salt per 1 qt of water) as often as every 1-2 hours to soothe your  throat.   Throat lozenges (if you are not at risk for choking) or sprays may be used to soothe your throat. SEEK MEDICAL CARE IF:   You have large, tender lumps in your neck.  You have a rash.  You cough up green, yellow-brown, or bloody spit. SEEK IMMEDIATE MEDICAL CARE IF:   Your neck becomes stiff.  You drool or are unable to swallow liquids.  You vomit or are unable to keep medicines or liquids down.  You have severe pain that does not go away with the use of recommended medicines.  You have trouble breathing (not caused by a stuffy nose). MAKE SURE YOU:   Understand these instructions.  Will watch your condition.  Will get help right away if you are not doing well or get worse.   This information is not intended to replace advice given to you by your health care provider. Make sure you discuss any questions you have with your health care provider.   Document Released: 10/26/2005 Document Revised: 08/16/2013 Document Reviewed: 07/03/2013 Elsevier Interactive Patient Education [employer].

## 2016-07-15 ENCOUNTER — Other Ambulatory Visit: Payer: Self-pay | Admitting: Family Medicine

## 2016-07-15 ENCOUNTER — Other Ambulatory Visit: Payer: Self-pay | Admitting: Emergency Medicine

## 2016-07-15 DIAGNOSIS — D72819 Decreased white blood cell count, unspecified: Secondary | ICD-10-CM

## 2016-07-15 DIAGNOSIS — D696 Thrombocytopenia, unspecified: Secondary | ICD-10-CM

## 2016-07-15 DIAGNOSIS — R779 Abnormality of plasma protein, unspecified: Secondary | ICD-10-CM

## 2016-07-15 DIAGNOSIS — E8809 Other disorders of plasma-protein metabolism, not elsewhere classified: Secondary | ICD-10-CM

## 2016-07-16 ENCOUNTER — Other Ambulatory Visit: Payer: Self-pay | Admitting: Emergency Medicine

## 2016-07-16 ENCOUNTER — Other Ambulatory Visit: Payer: Medicaid Other

## 2016-07-16 ENCOUNTER — Other Ambulatory Visit (INDEPENDENT_AMBULATORY_CARE_PROVIDER_SITE_OTHER): Payer: Medicaid Other

## 2016-07-16 DIAGNOSIS — D649 Anemia, unspecified: Secondary | ICD-10-CM

## 2016-07-16 DIAGNOSIS — R779 Abnormality of plasma protein, unspecified: Secondary | ICD-10-CM

## 2016-07-16 DIAGNOSIS — D72819 Decreased white blood cell count, unspecified: Secondary | ICD-10-CM

## 2016-07-16 DIAGNOSIS — E8809 Other disorders of plasma-protein metabolism, not elsewhere classified: Secondary | ICD-10-CM

## 2016-07-16 DIAGNOSIS — D696 Thrombocytopenia, unspecified: Secondary | ICD-10-CM | POA: Diagnosis not present

## 2016-07-16 LAB — CBC WITH DIFFERENTIAL/PLATELET
BASOS PCT: 0.9 % (ref 0.0–3.0)
Basophils Absolute: 0 10*3/uL (ref 0.0–0.1)
EOS PCT: 1.4 % (ref 0.0–5.0)
Eosinophils Absolute: 0 10*3/uL (ref 0.0–0.7)
HEMATOCRIT: 35.2 % — AB (ref 36.0–46.0)
HEMOGLOBIN: 11.9 g/dL — AB (ref 12.0–15.0)
LYMPHS PCT: 31.3 % (ref 12.0–46.0)
Lymphs Abs: 1 10*3/uL (ref 0.7–4.0)
MCHC: 33.9 g/dL (ref 30.0–36.0)
MCV: 87.2 fl (ref 78.0–100.0)
MONO ABS: 0.2 10*3/uL (ref 0.1–1.0)
Monocytes Relative: 6.5 % (ref 3.0–12.0)
Neutro Abs: 1.8 10*3/uL (ref 1.4–7.7)
Neutrophils Relative %: 59.9 % (ref 43.0–77.0)
Platelets: 139 10*3/uL — ABNORMAL LOW (ref 150.0–400.0)
RBC: 4.03 Mil/uL (ref 3.87–5.11)
RDW: 14 % (ref 11.5–15.5)
WBC: 3.1 10*3/uL — AB (ref 4.0–10.5)

## 2016-07-16 LAB — RETICULOCYTES
ABS Retic: 32640 cells/uL (ref 20000–80000)
RBC.: 4.08 MIL/uL (ref 3.80–5.10)
RETIC CT PCT: 0.8 %

## 2016-07-17 ENCOUNTER — Other Ambulatory Visit: Payer: Medicaid Other

## 2016-07-20 LAB — PROTEIN ELECTROPHORESIS, SERUM
ALBUMIN ELP: 3.6 g/dL — AB (ref 3.8–4.8)
ALPHA-1-GLOBULIN: 0.3 g/dL (ref 0.2–0.3)
Abnormal Protein Band1: 1.3 g/dL
Alpha-2-Globulin: 0.9 g/dL (ref 0.5–0.9)
Beta 2: 0.5 g/dL (ref 0.2–0.5)
Beta Globulin: 0.4 g/dL (ref 0.4–0.6)
Gamma Globulin: 2.4 g/dL — ABNORMAL HIGH (ref 0.8–1.7)
TOTAL PROTEIN, SERUM ELECTROPHOR: 8.1 g/dL (ref 6.1–8.1)

## 2016-07-21 ENCOUNTER — Other Ambulatory Visit: Payer: Self-pay | Admitting: Family Medicine

## 2016-07-21 DIAGNOSIS — R778 Other specified abnormalities of plasma proteins: Secondary | ICD-10-CM

## 2016-07-21 LAB — PROTEIN, TOTAL AND ELECTRO, 24 HR U
ALPHA-1-GLOBULIN, U: 26.5 %
ALPHA-2-GLOBULIN, U: 12.3 %
Albumin: 33.6 %
Beta Globulin, U: 17.4 %
COLLECTION INTERVAL: 24 h
CREATININE, URINE: 288 mg/dL (ref 20–320)
Creatinine, 24H Ur: 1.44 g/(24.h) (ref 0.63–2.50)
Gamma Globulin, U: 10.2 %
Protein Creatinine Ratio: 90 mg/g creat (ref 21–161)
Protein, 24H Urine: 130 mg/24 h (ref <150)
Protein, Urine: 26 mg/dL — ABNORMAL HIGH (ref 5–24)
Total Volume, Urine: 500 mL

## 2016-07-26 ENCOUNTER — Encounter: Payer: Self-pay | Admitting: Family Medicine

## 2016-07-26 DIAGNOSIS — R0602 Shortness of breath: Secondary | ICD-10-CM | POA: Insufficient documentation

## 2016-07-26 DIAGNOSIS — R778 Other specified abnormalities of plasma proteins: Secondary | ICD-10-CM | POA: Insufficient documentation

## 2016-07-26 HISTORY — DX: Other specified abnormalities of plasma proteins: R77.8

## 2016-07-26 NOTE — Assessment & Plan Note (Signed)
Is following with [facility] neurology

## 2016-07-26 NOTE — Assessment & Plan Note (Signed)
Tolerating statin, encouraged heart healthy diet, avoid trans fats, minimize simple carbs and saturated fats. Increase exercise as tolerated 

## 2016-07-26 NOTE — Assessment & Plan Note (Signed)
Well controlled, no changes to meds. Encouraged heart healthy diet such as the DASH diet and exercise as tolerated.  °

## 2016-07-26 NOTE — Assessment & Plan Note (Signed)
Was seen in ER in [place] last week. Is better now but will see cardiology soon. Seek care if worsens.

## 2016-07-26 NOTE — Progress Notes (Signed)
[name], female   DOB: 06-15-1956, 60 y.o.   MRN: [id number]   Subjective:    [name], female    DOB: 10/23/1956, 60 y.o.   MRN: [id number]  Chief Complaint  Patient presents with  . Follow-up    HPI Patient is in today for ER follow up. She was out of town in [place] when she had severe mid back pain that was unresponsive to OTC meds, position changes. Made her sob and was unresponsive to Albuterol. She endorses some nasal and chest congestion but it is mild and no fevers. She has noted some chest discomfort, malaise and myalgias as well. Denies HA/congestion/fevers/GI or GU c/o. Taking meds as prescribed  Past Medical History:  Diagnosis Date  . Abnormal SPEP 07/26/2016  . Arthritis    knees, hands  . Back pain 07/14/2016  . Bowel obstruction ([place]) 11/2014  . CHF (congestive heart failure) ([place])   . Congestive heart failure ([place]) 02/24/2015   S/p pacemaker  . Depression   . GERD (gastroesophageal reflux disease)   . Hyperlipidemia   . Hypertension   . Obstructive sleep apnea 03/31/2015  . SOB (shortness of breath) 04/09/2016  . Stroke [facility])    TIAs    Past Surgical History:  Procedure Laterality Date  . ABDOMINAL HYSTERECTOMY     menorraghia, 2006, total  . CHOLECYSTECTOMY    . KNEE SURGERY     right, repair torn torn cartialage  . PACEMAKER INSERTION  08/2014  . TONSILLECTOMY    . TUBAL LIGATION      Family History  Problem Relation Age of Onset  . Diabetes Mother   . Hypertension Mother   . Heart disease Mother     s/p 1 stent  . Hyperlipidemia Mother   . Arthritis Mother   . Cancer Father     COLON  . Colon cancer Father 7  . Irritable bowel syndrome Sister   . Hyperlipidemia Daughter   . Hypertension Daughter   . Hypertension Maternal Grandmother   . Arthritis Maternal Grandmother   . Heart disease Maternal Grandfather     MI  . Hypertension Maternal Grandfather   . Arthritis Maternal Grandfather   . Hypertension  Son     Social History   Social History  . Marital status: Married    Spouse name: N/A  . Number of children: N/A  . Years of education: N/A   Occupational History  . [name].   Social History Main Topics  . Smoking status: Never Smoker  . Smokeless tobacco: [name]  . Alcohol use No  . Drug use: No  . Sexual activity: No     Comment: Lives alone, no dietary restrictions   Other Topics Concern  . [name]   Social History Narrative  . No narrative on file    Outpatient Medications Prior to Visit  Medication Sig Dispense Refill  . albuterol (PROVENTIL HFA;VENTOLIN HFA) 108 (90 Base) MCG/ACT inhaler Inhale 2 puffs into the lungs every 6 (six) hours as needed for wheezing or shortness of breath. 1 Inhaler 1  . alendronate (FOSAMAX) 70 MG tablet Take 1 tablet (70 mg total) by mouth once a week. Take with a full glass of water on an empty stomach. 12 tablet 3  . amLODipine (NORVASC) 5 MG tablet Take 1 tablet (5 mg total) by mouth daily. 90 tablet 2  . benazepril (LOTENSIN) 20 MG tablet Take 1 tablet (20 mg  total) by mouth 2 (two) times daily. 180 tablet 1  . butalbital-acetaminophen-caffeine (FIORICET) 50-325-40 MG tablet Take by mouth.    . carvedilol (COREG) 25 MG tablet Take 1 tablet (25 mg total) by mouth 2 (two) times daily with a meal. 180 tablet 1  . Cholecalciferol (VITAMIN D3) 5000 UNITS CAPS Take 1 capsule by mouth daily.    Marland Kitchen docusate sodium (COLACE) 50 MG capsule Take 50 mg by mouth 2 (two) times daily.    . ergocalciferol (VITAMIN D2) 50000 units capsule Take 1 capsule (50,000 Units total) by mouth once a week. 12 capsule 1  . escitalopram (LEXAPRO) 20 MG tablet Take 1 tablet (20 mg total) by mouth daily. 90 tablet 1  . fluconazole (DIFLUCAN) 150 MG tablet Take 1 tablet (150 mg total) by mouth once. 1 tablet 0  . fluticasone (FLONASE) 50 MCG/ACT nasal spray Place 2 sprays into both nostrils daily. 48 g 1  . furosemide (LASIX) 20 MG tablet Take 1 tablet  (20 mg total) by mouth daily. 90 tablet 1  . hyoscyamine (LEVSIN SL) 0.125 MG SL tablet Place 1 tablet (0.125 mg total) under the tongue every 4 (four) hours as needed. 90 tablet 01  . ibuprofen (ADVIL,MOTRIN) 600 MG tablet Take 1 tablet (600 mg total) by mouth 2 (two) times daily with a meal. 60 tablet 3  . meclizine (ANTIVERT) 25 MG tablet Take 1 tablet (25 mg total) by mouth 3 (three) times daily as needed for dizziness. 30 tablet 0  . metroNIDAZOLE (FLAGYL) 500 MG tablet Take 1 tablet (500 mg total) by mouth 2 (two) times daily. Take for 5 days 10 tablet 0  . modafinil (PROVIGIL) 100 MG tablet Take 1 tablet (100 mg total) by mouth daily. 30 tablet 5  . Multiple Vitamins-Minerals (CENTRUM SILVER ADULT 50+ PO) Take by mouth.    . naproxen (NAPROSYN) 500 MG tablet Take 1 tablet (500 mg total) by mouth 2 (two) times daily with a meal. 30 tablet 0  . ondansetron (ZOFRAN) 4 MG tablet Take 1 tablet (4 mg total) by mouth every 8 (eight) hours as needed for nausea or vomiting. 90 tablet 1  . pantoprazole (PROTONIX) 40 MG tablet Take 1 tablet (40 mg total) by mouth 2 (two) times daily. 180 tablet 1  . potassium chloride SA (K-DUR,KLOR-CON) 20 MEQ tablet Take 1 tablet (20 mEq total) by mouth daily. 90 tablet 1  . predniSONE (DELTASONE) 20 MG tablet Take 1 tablet (20 mg total) by mouth 2 (two) times daily with a meal. 10 tablet 0  . promethazine (PHENERGAN) 25 MG tablet Take 1 tablet (25 mg total) by mouth every 6 (six) hours as needed for nausea or vomiting. 60 tablet 5  . silver sulfADIAZINE (SILVADENE) 1 % cream Apply 1 application topically daily. 50 g 1  . sucralfate (CARAFATE) 1 G tablet Take 1 tablet (1 g total) by mouth 2 (two) times daily. 180 tablet 1  . tiZANidine (ZANAFLEX) 4 MG tablet TAKE ONE TABLET BY MOUTH EVERY 6 HOURS AS NEEDED FOR MUSCLE SPASM 30 tablet 0  . zolpidem (AMBIEN) 10 MG tablet Take 0.5-1 tablets (5-10 mg total) by mouth at bedtime as needed for sleep. 30 tablet 1   No  facility-administered medications prior to visit.     Allergies  Allergen Reactions  . Codeine Nausea And Vomiting    Review of Systems  Constitutional: Negative for fever and malaise/fatigue.  HENT: Negative for congestion.   Eyes: Negative for blurred vision.  Respiratory: Positive for shortness of breath.   Cardiovascular: Positive for chest pain. Negative for palpitations and leg swelling.  Gastrointestinal: Negative for abdominal pain, blood in stool and nausea.  Genitourinary: Negative for dysuria and frequency.  Musculoskeletal: Positive for back pain. Negative for falls.  Skin: Negative for rash.  Neurological: Negative for dizziness, loss of consciousness and headaches.  Endo/Heme/Allergies: Negative for environmental allergies.  Psychiatric/Behavioral: Negative for depression. The patient is not nervous/anxious.        Objective:    Physical Exam  Constitutional: She is oriented to person, place, and time. She appears well-developed and well-nourished. No distress.  HENT:  Head: Normocephalic and atraumatic.  Nose: Nose normal.  Eyes: Right eye exhibits no discharge. Left eye exhibits no discharge.  Neck: Normal range of motion. Neck supple.  Cardiovascular: Normal rate and regular rhythm.   No murmur heard. Pulmonary/Chest: Effort normal and breath sounds normal.  Abdominal: Soft. Bowel sounds are normal. There is no tenderness.  Musculoskeletal: She exhibits no edema.  Lymphadenopathy:    She has cervical adenopathy.  Neurological: She is alert and oriented to person, place, and time.  Skin: Skin is warm and dry.  Psychiatric: She has a normal mood and affect.  Nursing note and vitals reviewed.   BP (!) 140/92   Pulse 82   Temp 97.9 F (36.6 C) (Oral)   Ht 5\' 6"  (1.676 m)   Wt 183 lb 2 oz (83.1 kg)   BMI 29.56 kg/m  Wt Readings from Last 3 Encounters:  07/14/16 183 lb 2 oz (83.1 kg)  04/09/16 176 lb 2 oz (79.9 kg)  12/23/15 171 lb 8 oz (77.8 kg)      Lab Results  Component Value Date   WBC 3.1 (L) 07/16/2016   HGB 11.9 (L) 07/16/2016   HCT 35.2 (L) 07/16/2016   PLT 139.0 (L) 07/16/2016   GLUCOSE 92 07/14/2016   CHOL 170 04/09/2016   TRIG 72.0 04/09/2016   HDL 59.10 04/09/2016   LDLCALC 97 04/09/2016   ALT 24 07/14/2016   AST 31 07/14/2016   NA 139 07/14/2016   K 3.7 07/14/2016   CL 107 07/14/2016   CREATININE 0.74 07/14/2016   BUN 18 07/14/2016   CO2 26 07/14/2016   TSH 1.31 07/14/2016    Lab Results  Component Value Date   TSH 1.31 07/14/2016   Lab Results  Component Value Date   WBC 3.1 (L) 07/16/2016   HGB 11.9 (L) 07/16/2016   HCT 35.2 (L) 07/16/2016   MCV 87.2 07/16/2016   PLT 139.0 (L) 07/16/2016   Lab Results  Component Value Date   NA 139 07/14/2016   K 3.7 07/14/2016   CO2 26 07/14/2016   GLUCOSE 92 07/14/2016   BUN 18 07/14/2016   CREATININE 0.74 07/14/2016   BILITOT 0.4 07/14/2016   ALKPHOS 43 07/14/2016   AST 31 07/14/2016   ALT 24 07/14/2016   PROT 8.5 (H) 07/14/2016   ALBUMIN 3.7 07/14/2016   CALCIUM 8.9 07/14/2016   GFR 102.23 07/14/2016   Lab Results  Component Value Date   CHOL 170 04/09/2016   Lab Results  Component Value Date   HDL 59.10 04/09/2016   Lab Results  Component Value Date   LDLCALC 97 04/09/2016   Lab Results  Component Value Date   TRIG 72.0 04/09/2016   Lab Results  Component Value Date   CHOLHDL 3 04/09/2016   No results found for: HGBA1C     Assessment & Plan:  Problem List Items Addressed This Visit    GERD (gastroesophageal reflux disease)    Avoid offending foods, start probiotics. Do not eat large meals in late evening and consider raising head of bed.       Hyperlipidemia    Tolerating statin, encouraged heart healthy diet, avoid trans fats, minimize simple carbs and saturated fats. Increase exercise as tolerated      Hypertension    Well controlled, no changes to meds. Encouraged heart healthy diet such as the DASH diet and  exercise as tolerated.       Relevant Orders   CBC (Completed)   Comp Met (CMET) (Completed)   TSH (Completed)   Congestive heart failure ([place])    Has an appointment with cardiology soon. No changes to management today      Back pain    Encouraged moist heat and gentle stretching as tolerated. May try NSAIDs and prescription meds as directed and report if symptoms worsen or seek immediate care. Try topical treatments      Relevant Medications   baclofen (LIORESAL) 20 MG tablet   SOB (shortness of breath)    Was seen in ER in [place] last week. Is better now but will see cardiology soon. Seek care if worsens.       Abnormal SPEP    Neutropenia, fatigue, sob, abnormal SPEP and lymphadenopathy will refer to oncology for evaluation       Other Visit Diagnoses    Pleural effusion    -  Primary   Relevant Orders   DG Chest 2 View   Encounter for immunization       Relevant Medications   baclofen (LIORESAL) 20 MG tablet   Other Relevant Orders   Flu Vaccine QUAD 36+ mos IM (Completed)      I have discontinued Ms. Hartman's tiZANidine. I have also changed her baclofen. Additionally, I am having her start on amoxicillin and gabapentin. Lastly, I am having her maintain her docusate sodium, Vitamin D3, Multiple Vitamins-Minerals (CENTRUM SILVER ADULT 50+ PO), butalbital-acetaminophen-caffeine, furosemide, escitalopram, benazepril, amLODipine, fluticasone, hyoscyamine, modafinil, ondansetron, promethazine, sucralfate, fluconazole, ibuprofen, metroNIDAZOLE, naproxen, zolpidem, pantoprazole, alendronate, ergocalciferol, potassium chloride SA, meclizine, predniSONE, albuterol, carvedilol, and silver sulfADIAZINE.  Meds ordered this encounter  Medications  . DISCONTD: baclofen (LIORESAL) 20 MG tablet    Sig: Take 1 tablet (20 mg total) by mouth 2 (two) times daily as needed for muscle spasms.    Dispense:  90 each    Refill:  2  . baclofen (LIORESAL) 20 MG tablet    Sig: Take 1 tablet (20 mg  total) by mouth 3 (three) times daily as needed for muscle spasms.    Dispense:  90 each    Refill:  2  . amoxicillin (AMOXIL) 500 MG capsule    Sig: Take 1 capsule (500 mg total) by mouth 3 (three) times daily.    Dispense:  30 capsule    Refill:  0  . gabapentin (NEURONTIN) 300 MG capsule    Sig: Take 1 capsule (300 mg total) by mouth 3 (three) times daily.    Dispense:  90 capsule    Refill:  3     Penni Homans, MD

## 2016-07-26 NOTE — Assessment & Plan Note (Signed)
Avoid offending foods, start probiotics. Do not eat large meals in late evening and consider raising head of bed.  

## 2016-07-26 NOTE — Assessment & Plan Note (Signed)
Neutropenia, fatigue, sob, abnormal SPEP and lymphadenopathy will refer to oncology for evaluation

## 2016-07-26 NOTE — Assessment & Plan Note (Signed)
Encouraged moist heat and gentle stretching as tolerated. May try NSAIDs and prescription meds as directed and report if symptoms worsen or seek immediate care. Try topical treatments 

## 2016-07-26 NOTE — Assessment & Plan Note (Signed)
Has an appointment with cardiology soon. No changes to management today

## 2016-08-13 ENCOUNTER — Ambulatory Visit (HOSPITAL_BASED_OUTPATIENT_CLINIC_OR_DEPARTMENT_OTHER): Payer: Medicaid Other | Admitting: Hematology & Oncology

## 2016-08-13 ENCOUNTER — Encounter: Payer: Self-pay | Admitting: Hematology & Oncology

## 2016-08-13 ENCOUNTER — Other Ambulatory Visit (HOSPITAL_BASED_OUTPATIENT_CLINIC_OR_DEPARTMENT_OTHER): Payer: Medicaid Other

## 2016-08-13 ENCOUNTER — Ambulatory Visit: Payer: 59

## 2016-08-13 ENCOUNTER — Ambulatory Visit (HOSPITAL_BASED_OUTPATIENT_CLINIC_OR_DEPARTMENT_OTHER)
Admission: RE | Admit: 2016-08-13 | Discharge: 2016-08-13 | Disposition: A | Discharge status: Home / Self Care | Payer: Medicaid Other | Source: Ambulatory Visit | Attending: Family Medicine | Admitting: Family Medicine

## 2016-08-13 VITALS — BP 144/80 | HR 83 | Temp 97.6°F | Resp 18 | Ht 66.0 in | Wt 182.0 lb

## 2016-08-13 DIAGNOSIS — D472 Monoclonal gammopathy: Secondary | ICD-10-CM

## 2016-08-13 DIAGNOSIS — D508 Other iron deficiency anemias: Secondary | ICD-10-CM

## 2016-08-13 DIAGNOSIS — D72819 Decreased white blood cell count, unspecified: Secondary | ICD-10-CM | POA: Diagnosis not present

## 2016-08-13 DIAGNOSIS — I288 Other diseases of pulmonary vessels: Secondary | ICD-10-CM | POA: Diagnosis not present

## 2016-08-13 DIAGNOSIS — D696 Thrombocytopenia, unspecified: Secondary | ICD-10-CM

## 2016-08-13 DIAGNOSIS — I509 Heart failure, unspecified: Secondary | ICD-10-CM

## 2016-08-13 DIAGNOSIS — M170 Bilateral primary osteoarthritis of knee: Secondary | ICD-10-CM | POA: Insufficient documentation

## 2016-08-13 DIAGNOSIS — M160 Bilateral primary osteoarthritis of hip: Secondary | ICD-10-CM | POA: Insufficient documentation

## 2016-08-13 DIAGNOSIS — J90 Pleural effusion, not elsewhere classified: Secondary | ICD-10-CM | POA: Insufficient documentation

## 2016-08-13 DIAGNOSIS — I517 Cardiomegaly: Secondary | ICD-10-CM | POA: Diagnosis not present

## 2016-08-13 DIAGNOSIS — Z800 Family history of malignant neoplasm of digestive organs: Secondary | ICD-10-CM

## 2016-08-13 LAB — CBC WITH DIFFERENTIAL (CANCER CENTER ONLY)
BASO#: 0 10*3/uL (ref 0.0–0.2)
BASO%: 1 % (ref 0.0–2.0)
EOS%: 1.7 % (ref 0.0–7.0)
Eosinophils Absolute: 0.1 10*3/uL (ref 0.0–0.5)
HCT: 36.5 % (ref 34.8–46.6)
HEMOGLOBIN: 12 g/dL (ref 11.6–15.9)
LYMPH#: 1.1 10*3/uL (ref 0.9–3.3)
LYMPH%: 37.3 % (ref 14.0–48.0)
MCH: 29.8 pg (ref 26.0–34.0)
MCHC: 32.9 g/dL (ref 32.0–36.0)
MCV: 91 fL (ref 81–101)
MONO#: 0.2 10*3/uL (ref 0.1–0.9)
MONO%: 7.3 % (ref 0.0–13.0)
NEUT%: 52.7 % (ref 39.6–80.0)
NEUTROS ABS: 1.6 10*3/uL (ref 1.5–6.5)
PLATELETS: 112 10*3/uL — AB (ref 145–400)
RBC: 4.03 10*6/uL (ref 3.70–5.32)
RDW: 13.1 % (ref 11.1–15.7)
WBC: 3 10*3/uL — AB (ref 3.9–10.0)

## 2016-08-13 LAB — COMPREHENSIVE METABOLIC PANEL (CC13)
ALBUMIN: 3.7 g/dL (ref 3.6–4.8)
ALT: 12 IU/L (ref 0–32)
AST (SGOT): 20 IU/L (ref 0–40)
Albumin/Globulin Ratio: 0.8 — ABNORMAL LOW (ref 1.2–2.2)
Alkaline Phosphatase, S: 48 IU/L (ref 39–117)
BILIRUBIN TOTAL: 0.4 mg/dL (ref 0.0–1.2)
BUN / CREAT RATIO: 30 — AB (ref 12–28)
BUN: 22 mg/dL (ref 8–27)
CALCIUM: 9.4 mg/dL (ref 8.7–10.3)
CHLORIDE: 105 mmol/L (ref 96–106)
Carbon Dioxide, Total: 29 mmol/L (ref 18–29)
Creatinine, Ser: 0.73 mg/dL (ref 0.57–1.00)
GFR, EST AFRICAN AMERICAN: 102 mL/min/{1.73_m2} (ref >59)
GFR, EST NON AFRICAN AMERICAN: 89 mL/min/{1.73_m2} (ref >59)
GLUCOSE: 95 mg/dL (ref 65–99)
Globulin, Total: 4.8 g/dL — ABNORMAL HIGH (ref 1.5–4.5)
Potassium, Ser: 3.5 mmol/L (ref 3.5–5.2)
Sodium: 140 mmol/L (ref 134–144)
TOTAL PROTEIN: 8.5 g/dL (ref 6.0–8.5)

## 2016-08-13 LAB — CHCC SATELLITE - SMEAR

## 2016-08-13 NOTE — Progress Notes (Signed)
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 Other    New Patient  : I feel tired.  HPI: Tami [name] is a very nice 60 year old African American female. She does have multiple health problems. She does have congestive heart failure. She had a pacemaker placed 2 years ago. She is not sure exactly as to why his pacemaker was placed.  She is on numerous medications. Again, she has multiple health issues.  She does have some mild leukopenia. This is not anything that is new. She has been noted to have this for the past year or so.  She, for some reason, had an SPEP done. This was done about a month ago. It showed that she had a small amount of abnormal protein. She had an M spike of 1.3 g/dL. Again this was not further identified.  Showed a 24 urine done. This did not show any monoclonal spike in the urine.  She does have mild leukopenia. She has mild thrombocytopenia.  She does not drink. She does not smoke. She has had some surgeries in the past. She had a hysterectomy about 20 years ago. Showed a blood transfusion with this.  She just feels tired. She does not have a lot of energy. This might be from her heart. Might be from her medications.  She was kindly referred to was to go for [place] because of the M spike and to see what further evaluation was needed.  There is no history of blood problems in the family. There is no sickle cell in the family. Her father had colon cancer but did not I from this.\  She has no occupational exposures.  She currently is living up in [place] area and she hopefully will move back to [place] in November.  There is no possible weight loss or weight gain. She's had no change in bowel or bladder habits. She is up-to-date with her mammograms. Her last mammogram was back in February.  Overall, her performance status is ECOG 1.    Past Medical History:  Diagnosis Date  . Abnormal  SPEP 07/26/2016  . Arthritis    knees, hands  . Back pain 07/14/2016  . Bowel obstruction 11/2014  . CHF (congestive heart failure) ([place])   . Congestive heart failure ([place]) 02/24/2015   S/p pacemaker  . Depression   . GERD (gastroesophageal reflux disease)   . Hyperlipidemia   . Hypertension   . Obstructive sleep apnea 03/31/2015  . SOB (shortness of breath) 04/09/2016  . Stroke [facility])    TIAs  :  Past Surgical History:  Procedure Laterality Date  . ABDOMINAL HYSTERECTOMY     menorraghia, 2006, total  . CHOLECYSTECTOMY    . KNEE SURGERY     right, repair torn torn cartialage  . PACEMAKER INSERTION  08/2014  . TONSILLECTOMY    . TUBAL LIGATION    :   Current Outpatient Prescriptions:  .  albuterol (PROVENTIL HFA;VENTOLIN HFA) 108 (90 Base) MCG/ACT inhaler, Inhale 2 puffs into the lungs every 6 (six) hours as needed for wheezing or shortness of breath., Disp: 1 Inhaler, Rfl: 1 .  alendronate (FOSAMAX) 70 MG tablet, Take 1 tablet (70 mg total) by mouth once a week. Take with a full glass of water on an empty stomach., Disp: 12 tablet, Rfl: 3 .  amLODipine (NORVASC) 5 MG tablet, Take 1 tablet (5 mg total) by mouth daily., Disp: 90 tablet,  Rfl: 2 .  amoxicillin (AMOXIL) 500 MG capsule, Take 1 capsule (500 mg total) by mouth 3 (three) times daily., Disp: 30 capsule, Rfl: 0 .  baclofen (LIORESAL) 20 MG tablet, Take 1 tablet (20 mg total) by mouth 3 (three) times daily as needed for muscle spasms., Disp: 90 each, Rfl: 2 .  benazepril (LOTENSIN) 20 MG tablet, Take 1 tablet (20 mg total) by mouth 2 (two) times daily., Disp: 180 tablet, Rfl: 1 .  carvedilol (COREG) 25 MG tablet, Take 1 tablet (25 mg total) by mouth 2 (two) times daily with a meal., Disp: 180 tablet, Rfl: 1 .  Cholecalciferol (VITAMIN D3) 5000 UNITS CAPS, Take 1 capsule by mouth daily., Disp: , Rfl:  .  docusate sodium (COLACE) 50 MG capsule, Take 50 mg by mouth 2 (two) times daily., Disp: , Rfl:  .  ergocalciferol (VITAMIN D2)  50000 units capsule, Take 1 capsule (50,000 Units total) by mouth once a week., Disp: 12 capsule, Rfl: 1 .  escitalopram (LEXAPRO) 20 MG tablet, Take 1 tablet (20 mg total) by mouth daily., Disp: 90 tablet, Rfl: 1 .  fluconazole (DIFLUCAN) 150 MG tablet, Take 1 tablet (150 mg total) by mouth once., Disp: 1 tablet, Rfl: 0 .  fluticasone (FLONASE) 50 MCG/ACT nasal spray, Place 2 sprays into both nostrils daily., Disp: 48 g, Rfl: 1 .  furosemide (LASIX) 20 MG tablet, Take 1 tablet (20 mg total) by mouth daily., Disp: 90 tablet, Rfl: 1 .  gabapentin (NEURONTIN) 300 MG capsule, Take 1 capsule (300 mg total) by mouth 3 (three) times daily., Disp: 90 capsule, Rfl: 3 .  hyoscyamine (LEVSIN SL) 0.125 MG SL tablet, Place 1 tablet (0.125 mg total) under the tongue every 4 (four) hours as needed., Disp: 90 tablet, Rfl: 01 .  ibuprofen (ADVIL,MOTRIN) 600 MG tablet, Take 1 tablet (600 mg total) by mouth 2 (two) times daily with a meal., Disp: 60 tablet, Rfl: 3 .  meclizine (ANTIVERT) 25 MG tablet, Take 1 tablet (25 mg total) by mouth 3 (three) times daily as needed for dizziness., Disp: 30 tablet, Rfl: 0 .  modafinil (PROVIGIL) 100 MG tablet, Take 1 tablet (100 mg total) by mouth daily., Disp: 30 tablet, Rfl: 5 .  Multiple Vitamins-Minerals (CENTRUM SILVER ADULT 50+ PO), Take by mouth., Disp: , Rfl:  .  naproxen (NAPROSYN) 500 MG tablet, Take 1 tablet (500 mg total) by mouth 2 (two) times daily with a meal., Disp: 30 tablet, Rfl: 0 .  ondansetron (ZOFRAN) 4 MG tablet, Take 1 tablet (4 mg total) by mouth every 8 (eight) hours as needed for nausea or vomiting., Disp: 90 tablet, Rfl: 1 .  pantoprazole (PROTONIX) 40 MG tablet, Take 1 tablet (40 mg total) by mouth 2 (two) times daily., Disp: 180 tablet, Rfl: 1 .  potassium chloride SA (K-DUR,KLOR-CON) 20 MEQ tablet, Take 1 tablet (20 mEq total) by mouth daily., Disp: 90 tablet, Rfl: 1 .  predniSONE (DELTASONE) 20 MG tablet, Take 1 tablet (20 mg total) by mouth 2 (two)  times daily with a meal., Disp: 10 tablet, Rfl: 0 .  promethazine (PHENERGAN) 25 MG tablet, Take 1 tablet (25 mg total) by mouth every 6 (six) hours as needed for nausea or vomiting., Disp: 60 tablet, Rfl: 5 .  silver sulfADIAZINE (SILVADENE) 1 % cream, Apply 1 application topically daily., Disp: 50 g, Rfl: 1 .  sucralfate (CARAFATE) 1 G tablet, Take 1 tablet (1 g total) by mouth 2 (two) times daily., Disp: 180 tablet,  Rfl: 1 .  zolpidem (AMBIEN) 10 MG tablet, Take 0.5-1 tablets (5-10 mg total) by mouth at bedtime as needed for sleep., Disp: 30 tablet, Rfl: 1:  :  Allergies  Allergen Reactions  . Codeine Nausea And Vomiting  :  Family History  Problem Relation Age of Onset  . Diabetes Mother   . Hypertension Mother   . Heart disease Mother     s/p 1 stent  . Hyperlipidemia Mother   . Arthritis Mother   . Cancer Father     COLON  . Colon cancer Father 67  . Irritable bowel syndrome Sister   . Hyperlipidemia Daughter   . Hypertension Daughter   . Hypertension Maternal Grandmother   . Arthritis Maternal Grandmother   . Heart disease Maternal Grandfather     MI  . Hypertension Maternal Grandfather   . Arthritis Maternal Grandfather   . Hypertension Son   :  Social History   Social History  . Marital status: Married    Spouse name: N/A  . Number of children: N/A  . Years of education: N/A   Occupational History  . [name].   Social History Main Topics  . Smoking status: Never Smoker  . Smokeless tobacco: Never Used  . Alcohol use No  . Drug use: No  . Sexual activity: No     Comment: Lives alone, no dietary restrictions   Other Topics Concern  . [name]   Social History Narrative  . No narrative on file  :  Pertinent items are noted in HPI.  Exam: '@IPVITALS'$ @  Well-developed and well-nourished African-American female in no obvious distress. Vital signs are temperature of 97.6. Pulse 83. Blood pressure 144/80. Weight is under 82 pounds. Head and neck  exam shows no ocular or oral lesions. There are no palpable cervical or supraclavicular lymph nodes. Lungs are clear bilaterally. Cardiac exam regular rate and rhythm with no murmurs, rubs or bruits. Abdomen is soft. She has good bowel sounds. There is no fluid wave. There is no palpable liver or spleen tip. Back exam shows no tenderness over the spine, ribs or hips. Extremities shows no clubbing, cyanosis or edema. Neurological exam shows no focal neurological deficits. Skin exam shows no rashes, ecchymoses or petechia.    Recent Labs  08/13/16 1453  WBC 3.0*  HGB 12.0  HCT 36.5  PLT 112*   No results for input(s): NA, K, CL, CO2, GLUCOSE, BUN, CREATININE, CALCIUM in the last 72 hours.  Blood smear review:  Normochromic and normocytic population of red blood cells. She has no nucleated red blood cells. She has no teardrop cells. I see no rouleau formation. There is no target cells. She has no schistocytes or spherocytes. White cells were normal in morphology maturation. She has no immature myeloid or lymphoid forms. There are no hypersegmented polys. I see no plasma cells. Platelets are mildly decreased in number. Platelets are well granulated.  Pathology: None     Assessment and Plan:  Tami [name] is a very charming 60 year old African-American female. She has a small M spike. 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 Given her cardiac history, and her medications, it would not surprise me if she had an M spike.  Her 24 urine that she had done did not show any light chains. This would go against an underlying plasma cell malignancy.  I will get a bone survey on her. We'll see what the  bone survey shows.  I don't think that we need a bone marrow biopsy on her. However, if the bone survey is positive for lytic lesions, then a bone marrow biopsy may be necessary.  I spent about 1 hour with she and her friend. 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 I gave both of them a prayer blanket which they were very much appreciative of.  I answered all her questions. I told her that I would like to get her back in about 6 weeks. We will get her back sooner if necessary depending on what our lab results show.

## 2016-08-14 ENCOUNTER — Telehealth: Payer: Self-pay | Admitting: *Deleted

## 2016-08-14 LAB — IGG, IGA, IGM
IGM (IMMUNOGLOBIN M), SRM: 154 mg/dL (ref 26–217)
IgA, Qn, Serum: 253 mg/dL (ref 87–352)
IgG, Qn, Serum: 2518 mg/dL — ABNORMAL HIGH (ref 700–1600)

## 2016-08-14 LAB — KAPPA/LAMBDA LIGHT CHAINS
Ig Kappa Free Light Chain: 65.1 mg/L — ABNORMAL HIGH (ref 3.3–19.4)
Ig Lambda Free Light Chain: 10.6 mg/L (ref 5.7–26.3)
Kappa/Lambda FluidC Ratio: 6.14 — ABNORMAL HIGH (ref 0.26–1.65)

## 2016-08-14 LAB — RETICULOCYTES: Reticulocyte Count: 0.7 % (ref 0.6–2.6)

## 2016-08-14 LAB — BETA 2 MICROGLOBULIN, SERUM: Beta-2: 2 mg/L (ref 0.6–2.4)

## 2016-08-14 LAB — LACTATE DEHYDROGENASE: LDH: 208 U/L (ref 125–245)

## 2016-08-14 NOTE — Telephone Encounter (Addendum)
Patient aware of results  ----- Message from [name], MD sent at 08/14/2016 11:59 AM EDT ----- Call - the bone xrays do NOT show any abnormalities that would suggest a blood issue!!  pete

## 2016-08-18 LAB — PROTEIN ELECTROPHORESIS, SERUM, WITH REFLEX
A/G RATIO SPE: 0.8 (ref 0.7–1.7)
ALBUMIN: 3.5 g/dL (ref 2.9–4.4)
Alpha 1: 0.2 g/dL (ref 0.0–0.4)
Alpha 2: 0.8 g/dL (ref 0.4–1.0)
BETA: 1.1 g/dL (ref 0.7–1.3)
GLOBULIN, TOTAL: 4.4 g/dL — AB (ref 2.2–3.9)
Gamma Globulin: 2.3 g/dL — ABNORMAL HIGH (ref 0.4–1.8)
INTERPRETATION(SEE BELOW): 0
M-Spike, %: 1.2 g/dL — ABNORMAL HIGH
PDF SPE: 0
Total Protein: 7.9 g/dL (ref 6.0–8.5)

## 2016-09-15 ENCOUNTER — Ambulatory Visit (INDEPENDENT_AMBULATORY_CARE_PROVIDER_SITE_OTHER): Payer: Medicaid Other | Admitting: Family Medicine

## 2016-09-15 ENCOUNTER — Encounter: Payer: Self-pay | Admitting: Family Medicine

## 2016-09-15 DIAGNOSIS — K219 Gastro-esophageal reflux disease without esophagitis: Secondary | ICD-10-CM

## 2016-09-15 DIAGNOSIS — Z2911 Encounter for prophylactic immunotherapy for respiratory syncytial virus (RSV): Secondary | ICD-10-CM

## 2016-09-15 DIAGNOSIS — M549 Dorsalgia, unspecified: Secondary | ICD-10-CM

## 2016-09-15 DIAGNOSIS — D472 Monoclonal gammopathy: Secondary | ICD-10-CM

## 2016-09-15 DIAGNOSIS — Z23 Encounter for immunization: Secondary | ICD-10-CM

## 2016-09-15 DIAGNOSIS — I10 Essential (primary) hypertension: Secondary | ICD-10-CM

## 2016-09-15 DIAGNOSIS — E785 Hyperlipidemia, unspecified: Secondary | ICD-10-CM

## 2016-09-15 MED ORDER — TRIAMTERENE-HCTZ 37.5-25 MG PO TABS: 1.0000 | ORAL_TABLET | Freq: Every day | ORAL | 1 refills | Status: DC

## 2016-09-15 NOTE — Patient Instructions (Signed)
Hypertension Hypertension, commonly called high blood pressure, is when the force of blood pumping through your arteries is too strong. Your arteries are the blood vessels that carry blood from your heart throughout your body. A blood pressure reading consists of a higher number over a lower number, such as 110/72. The higher number (systolic) is the pressure inside your arteries when your heart pumps. The lower number (diastolic) is the pressure inside your arteries when your heart relaxes. Ideally you want your blood pressure below 120/80. Hypertension forces your heart to work harder to pump blood. Your arteries may become narrow or stiff. Having untreated or uncontrolled hypertension can cause heart attack, stroke, kidney disease, and other problems. RISK FACTORS Some risk factors for high blood pressure are controllable. Others are not.  Risk factors you cannot control include:   Race. You may be at higher risk if you are African American.  Age. Risk increases with age.  Gender. Men are at higher risk than women before age 45 years. After age 65, women are at higher risk than men. Risk factors you can control include:  Not getting enough exercise or physical activity.  Being overweight.  Getting too much fat, sugar, calories, or salt in your diet.  Drinking too much alcohol. SIGNS AND SYMPTOMS Hypertension does not usually cause signs or symptoms. Extremely high blood pressure (hypertensive crisis) may cause headache, anxiety, shortness of breath, and nosebleed. DIAGNOSIS To check if you have hypertension, your health care provider will measure your blood pressure while you are seated, with your arm held at the level of your heart. It should be measured at least twice using the same arm. Certain conditions can cause a difference in blood pressure between your right and left arms. A blood pressure reading that is higher than normal on one occasion does not mean that you need treatment. If  it is not clear whether you have high blood pressure, you may be asked to return on a different day to have your blood pressure checked again. Or, you may be asked to monitor your blood pressure at home for 1 or more weeks. TREATMENT Treating high blood pressure includes making lifestyle changes and possibly taking medicine. Living a healthy lifestyle can help lower high blood pressure. You may need to change some of your habits. Lifestyle changes may include:  Following the DASH diet. This diet is high in fruits, vegetables, and whole grains. It is low in salt, red meat, and added sugars.  Keep your sodium intake below 2,300 mg per day.  Getting at least 30-45 minutes of aerobic exercise at least 4 times per week.  Losing weight if necessary.  Not smoking.  Limiting alcoholic beverages.  Learning ways to reduce stress. Your health care provider may prescribe medicine if lifestyle changes are not enough to get your blood pressure under control, and if one of the following is true:  You are 18-59 years of age and your systolic blood pressure is above 140.  You are 60 years of age or older, and your systolic blood pressure is above 150.  Your diastolic blood pressure is above 90.  You have diabetes, and your systolic blood pressure is over 140 or your diastolic blood pressure is over 90.  You have kidney disease and your blood pressure is above 140/90.  You have heart disease and your blood pressure is above 140/90. Your personal target blood pressure may vary depending on your medical conditions, your age, and other factors. HOME CARE INSTRUCTIONS    Have your blood pressure rechecked as directed by your health care provider.   Take medicines only as directed by your health care provider. Follow the directions carefully. Blood pressure medicines must be taken as prescribed. The medicine does not work as well when you skip doses. Skipping doses also puts you at risk for  problems.  Do not smoke.   Monitor your blood pressure at home as directed by your health care provider. SEEK MEDICAL CARE IF:   You think you are having a reaction to medicines taken.  You have recurrent headaches or feel dizzy.  You have swelling in your ankles.  You have trouble with your vision. SEEK IMMEDIATE MEDICAL CARE IF:  You develop a severe headache or confusion.  You have unusual weakness, numbness, or feel faint.  You have severe chest or abdominal pain.  You vomit repeatedly.  You have trouble breathing. MAKE SURE YOU:   Understand these instructions.  Will watch your condition.  Will get help right away if you are not doing well or get worse.   This information is not intended to replace advice given to you by your health care provider. Make sure you discuss any questions you have with your health care provider.   Document Released: 10/26/2005 Document Revised: 03/12/2015 Document Reviewed: 08/18/2013 [employer] Interactive Patient Education 2016 [employer]  

## 2016-09-15 NOTE — Progress Notes (Signed)
Pre visit review using our clinic review tool, if applicable. No additional management support is needed unless otherwise documented below in the visit note. 

## 2016-09-16 ENCOUNTER — Ambulatory Visit (HOSPITAL_BASED_OUTPATIENT_CLINIC_OR_DEPARTMENT_OTHER): Payer: Medicaid Other | Admitting: Hematology & Oncology

## 2016-09-16 ENCOUNTER — Encounter: Payer: Self-pay | Admitting: Hematology & Oncology

## 2016-09-16 ENCOUNTER — Other Ambulatory Visit (HOSPITAL_BASED_OUTPATIENT_CLINIC_OR_DEPARTMENT_OTHER): Payer: Medicaid Other

## 2016-09-16 VITALS — BP 122/85 | HR 89 | Temp 98.2°F | Resp 20 | Ht 66.0 in | Wt 184.8 lb

## 2016-09-16 DIAGNOSIS — D472 Monoclonal gammopathy: Secondary | ICD-10-CM

## 2016-09-16 DIAGNOSIS — D508 Other iron deficiency anemias: Secondary | ICD-10-CM

## 2016-09-16 HISTORY — DX: Monoclonal gammopathy: D47.2

## 2016-09-16 LAB — CBC WITH DIFFERENTIAL (CANCER CENTER ONLY)
BASO#: 0 10*3/uL (ref 0.0–0.2)
BASO%: 1.7 % (ref 0.0–2.0)
EOS%: 2.5 % (ref 0.0–7.0)
Eosinophils Absolute: 0.1 10*3/uL (ref 0.0–0.5)
HCT: 36.6 % (ref 34.8–46.6)
HGB: 12.2 g/dL (ref 11.6–15.9)
LYMPH#: 0.8 10*3/uL — ABNORMAL LOW (ref 0.9–3.3)
LYMPH%: 33.8 % (ref 14.0–48.0)
MCH: 29.5 pg (ref 26.0–34.0)
MCHC: 33.3 g/dL (ref 32.0–36.0)
MCV: 89 fL (ref 81–101)
MONO#: 0.2 10*3/uL (ref 0.1–0.9)
MONO%: 7.5 % (ref 0.0–13.0)
NEUT#: 1.3 10*3/uL — ABNORMAL LOW (ref 1.5–6.5)
NEUT%: 54.5 % (ref 39.6–80.0)
Platelets: 124 10*3/uL — ABNORMAL LOW (ref 145–400)
RBC: 4.13 10*6/uL (ref 3.70–5.32)
RDW: 13.1 % (ref 11.1–15.7)
WBC: 2.4 10*3/uL — ABNORMAL LOW (ref 3.9–10.0)

## 2016-09-16 LAB — CMP (CANCER CENTER ONLY)
ALT(SGPT): 20 U/L (ref 10–47)
AST: 22 U/L (ref 11–38)
Albumin: 3.1 g/dL — ABNORMAL LOW (ref 3.3–5.5)
Alkaline Phosphatase: 44 U/L (ref 26–84)
BUN, Bld: 17 mg/dL (ref 7–22)
CO2: 27 mEq/L (ref 18–33)
Calcium: 8.9 mg/dL (ref 8.0–10.3)
Chloride: 105 mEq/L (ref 98–108)
Creat: 0.7 mg/dl (ref 0.6–1.2)
Glucose, Bld: 107 mg/dL (ref 73–118)
Potassium: 3.6 mEq/L (ref 3.3–4.7)
Sodium: 139 mEq/L (ref 128–145)
Total Bilirubin: 0.6 mg/dl (ref 0.20–1.60)
Total Protein: 8.1 g/dL (ref 6.4–8.1)

## 2016-09-16 LAB — CHCC SATELLITE - SMEAR

## 2016-09-17 ENCOUNTER — Ambulatory Visit: Payer: Medicaid Other | Admitting: Family Medicine

## 2016-09-17 LAB — KAPPA/LAMBDA LIGHT CHAINS
IG KAPPA FREE LIGHT CHAIN: 74.8 mg/L — AB (ref 3.3–19.4)
IG LAMBDA FREE LIGHT CHAIN: 11.2 mg/L (ref 5.7–26.3)
Kappa/Lambda FluidC Ratio: 6.68 — ABNORMAL HIGH (ref 0.26–1.65)

## 2016-09-17 LAB — IGG, IGA, IGM
IGM (IMMUNOGLOBIN M), SRM: 157 mg/dL (ref 26–217)
IgA, Qn, Serum: 287 mg/dL (ref 87–352)

## 2016-09-17 LAB — IRON AND TIBC
%SAT: 21 % (ref 21–57)
IRON: 59 ug/dL (ref 41–142)
TIBC: 281 ug/dL (ref 236–444)
UIBC: 222 ug/dL (ref 120–384)

## 2016-09-17 LAB — FERRITIN: FERRITIN: 169 ng/mL (ref 9–269)

## 2016-09-17 NOTE — Progress Notes (Signed)
Hematology and Oncology Follow Up Visit  Tami [name] [id number] 05/16/1956 60 y.o. 09/17/2016   Principle Diagnosis:   IgG MGUS  Current Therapy:    Observation     Interim History:  [name] is back for follow-up. This is her second office visit. We saw her originally back on October 5. At that point in time , we did a workup on her. A SPEP was done which showed a monoclonal spike of 1.2 g/dL. Her IgG level was 2518 milligrams per deciliter. Her Kappa Lightchain was 6.5 mg/dL.  We did a bone survey on her. There is no evidence of bone involvement by lytic lesions.  She's going to do a 24-hour urine today.  She has had no increase in fatigue or weakness. She feels a little better and will only last saw her.  She is now retired. 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  She's had no issues with fever. There's been no cough or shortness of breath. She's had no nausea or vomiting patient had no change in bowel or bladder habits.  She has some complaints of upper abdominal pain. She has been seeing gastroenterology for this. I think she has an appointment with them coming up.  Overall, her performance status is ECOG 1   Medications:  Current Outpatient Prescriptions:  .  albuterol (PROVENTIL HFA;VENTOLIN HFA) 108 (90 Base) MCG/ACT inhaler, Inhale 2 puffs into the lungs every 6 (six) hours as needed for wheezing or shortness of breath., Disp: 1 Inhaler, Rfl: 1 .  alendronate (FOSAMAX) 70 MG tablet, Take 1 tablet (70 mg total) by mouth once a week. Take with a full glass of water on an empty stomach., Disp: 12 tablet, Rfl: 3 .  amLODipine (NORVASC) 5 MG tablet, Take 1 tablet (5 mg total) by mouth daily., Disp: 90 tablet, Rfl: 2 .  baclofen (LIORESAL) 20 MG tablet, Take 1 tablet (20 mg total) by mouth 3 (three) times daily as needed for muscle spasms., Disp: 90 each, Rfl: 2 .  carvedilol (COREG) 25 MG tablet, Take 1 tablet (25 mg total) by mouth 2 (two) times daily with a meal., Disp:  180 tablet, Rfl: 1 .  Cholecalciferol (VITAMIN D3) 5000 UNITS CAPS, Take 1 capsule by mouth daily., Disp: , Rfl:  .  docusate sodium (COLACE) 50 MG capsule, Take 50 mg by mouth 2 (two) times daily., Disp: , Rfl:  .  ergocalciferol (VITAMIN D2) 50000 units capsule, Take 1 capsule (50,000 Units total) by mouth once a week., Disp: 12 capsule, Rfl: 1 .  escitalopram (LEXAPRO) 20 MG tablet, Take 1 tablet (20 mg total) by mouth daily., Disp: 90 tablet, Rfl: 1 .  fluticasone (FLONASE) 50 MCG/ACT nasal spray, Place 2 sprays into both nostrils daily., Disp: 48 g, Rfl: 1 .  furosemide (LASIX) 20 MG tablet, Take 1 tablet (20 mg total) by mouth daily., Disp: 90 tablet, Rfl: 1 .  gabapentin (NEURONTIN) 300 MG capsule, Take 1 capsule (300 mg total) by mouth 3 (three) times daily., Disp: 90 capsule, Rfl: 3 .  hyoscyamine (LEVSIN SL) 0.125 MG SL tablet, Place 1 tablet (0.125 mg total) under the tongue every 4 (four) hours as needed., Disp: 90 tablet, Rfl: 01 .  ibuprofen (ADVIL,MOTRIN) 600 MG tablet, Take 1 tablet (600 mg total) by mouth 2 (two) times daily with a meal., Disp: 60 tablet, Rfl: 3 .  meclizine (ANTIVERT) 25 MG tablet, Take 1 tablet (25 mg total) by mouth 3 (three) times daily as needed for  dizziness., Disp: 30 tablet, Rfl: 0 .  modafinil (PROVIGIL) 100 MG tablet, Take 1 tablet (100 mg total) by mouth daily., Disp: 30 tablet, Rfl: 5 .  Multiple Vitamins-Minerals (CENTRUM SILVER ADULT 50+ PO), Take by mouth., Disp: , Rfl:  .  naproxen (NAPROSYN) 500 MG tablet, Take 1 tablet (500 mg total) by mouth 2 (two) times daily with a meal., Disp: 30 tablet, Rfl: 0 .  ondansetron (ZOFRAN) 4 MG tablet, Take 1 tablet (4 mg total) by mouth every 8 (eight) hours as needed for nausea or vomiting., Disp: 90 tablet, Rfl: 1 .  pantoprazole (PROTONIX) 40 MG tablet, Take 1 tablet (40 mg total) by mouth 2 (two) times daily., Disp: 180 tablet, Rfl: 1 .  potassium chloride SA (K-DUR,KLOR-CON) 20 MEQ tablet, Take 1 tablet (20  mEq total) by mouth daily., Disp: 90 tablet, Rfl: 1 .  promethazine (PHENERGAN) 25 MG tablet, Take 1 tablet (25 mg total) by mouth every 6 (six) hours as needed for nausea or vomiting., Disp: 60 tablet, Rfl: 5 .  silver sulfADIAZINE (SILVADENE) 1 % cream, Apply 1 application topically daily., Disp: 50 g, Rfl: 1 .  sucralfate (CARAFATE) 1 G tablet, Take 1 tablet (1 g total) by mouth 2 (two) times daily., Disp: 180 tablet, Rfl: 1 .  triamterene-hydrochlorothiazide (MAXZIDE-25) 37.5-25 MG tablet, Take 1 tablet by mouth daily., Disp: 90 tablet, Rfl: 1 .  zolpidem (AMBIEN) 10 MG tablet, Take 0.5-1 tablets (5-10 mg total) by mouth at bedtime as needed for sleep., Disp: 30 tablet, Rfl: 1  Allergies:  Allergies  Allergen Reactions  . Benazepril Anaphylaxis    angioedema  . Codeine Nausea And Vomiting    Past Medical History, Surgical history, Social history, and Family History were reviewed and updated.  Review of Systems:  As above   Physical Exam:  height is 5\' 6"  (1.676 m) and weight is 184 lb 12.8 oz (83.8 kg). Her oral temperature is 98.2 F (36.8 C). Her blood pressure is 122/85 and her pulse is 89. Her respiration is 20.   Wt Readings from Last 3 Encounters:  09/16/16 184 lb 12.8 oz (83.8 kg)  09/15/16 186 lb (84.4 kg)  08/13/16 182 lb (82.6 kg)      Well-developed and well-nourished African-American female in no obvious distress. Head exam shows no ocular or oral lesions. There are no palpable cervical or supraclavicular lymph nodes. Lungs are clear. Cardiac exam regular rate and rhythm with no murmurs, rubs or bruits. Abdomen is soft. She has good bowel sounds. There may be some tenderness to palpation in the epigastric area. She has no palpable liver or spleen tip. Back exam shows no tenderness over the spine, ribs or hips. Extremity shows some trace edema in her lower legs. Neurological exam shows no focal neurological deficits. Skin exam shows no rashes, ecchymoses or petechia.    Lab Results  Component Value Date   WBC 2.4 (L) 09/16/2016   HGB 12.2 09/16/2016   HCT 36.6 09/16/2016   MCV 89 09/16/2016   PLT 124 (L) 09/16/2016     Chemistry      Component Value Date/Time   NA 139 09/16/2016 1422   K 3.6 09/16/2016 1422   CL 105 09/16/2016 1422   CO2 27 09/16/2016 1422   BUN 17 09/16/2016 1422   CREATININE 0.7 09/16/2016 1422      Component Value Date/Time   CALCIUM 8.9 09/16/2016 1422   ALKPHOS 44 09/16/2016 1422   AST 22 09/16/2016 1422  ALT 20 09/16/2016 1422   BILITOT 0.60 09/16/2016 1422         Impression and Plan: Ms. Beine isA 60 year old African-American female. She has a monoclonal gammopathy. I would say this is an MGUS. I do not see anything that looks like myeloma.  For now, we will see what her 24-hour urine shows. This will be helpful.  I don't see that we have to do any treatment on her right now.  I will like to get her back of the holidays. I the we can get her back in 2 or 3 months. This I think would be reasonable. She has a very strong faith area and I'm glad that she is retired right now.     [name], MD 11/9/20177:21 AM

## 2016-09-18 ENCOUNTER — Ambulatory Visit (INDEPENDENT_AMBULATORY_CARE_PROVIDER_SITE_OTHER): Payer: Self-pay | Admitting: Family Medicine

## 2016-09-18 VITALS — BP 120/80 | HR 86

## 2016-09-18 DIAGNOSIS — I10 Essential (primary) hypertension: Secondary | ICD-10-CM

## 2016-09-18 LAB — COMPLETE METABOLIC PANEL WITH GFR
ALBUMIN: 3.5 g/dL — AB (ref 3.6–5.1)
ALK PHOS: 41 U/L (ref 33–130)
ALT: 11 U/L (ref 6–29)
AST: 19 U/L (ref 10–35)
BUN: 19 mg/dL (ref 7–25)
CALCIUM: 9.1 mg/dL (ref 8.6–10.4)
CHLORIDE: 105 mmol/L (ref 98–110)
CO2: 26 mmol/L (ref 20–31)
Creat: 0.78 mg/dL (ref 0.50–0.99)
GFR, EST NON AFRICAN AMERICAN: 82 mL/min (ref >=60)
Glucose, Bld: 103 mg/dL — ABNORMAL HIGH (ref 65–99)
POTASSIUM: 3.4 mmol/L — AB (ref 3.5–5.3)
Sodium: 140 mmol/L (ref 135–146)
Total Bilirubin: 0.4 mg/dL (ref 0.2–1.2)
Total Protein: 7.8 g/dL (ref 6.1–8.1)

## 2016-09-18 LAB — PROTEIN ELECTROPHORESIS, SERUM, WITH REFLEX
A/G Ratio: 0.8 (ref 0.7–1.7)
ALPHA 1: 0.2 g/dL (ref 0.0–0.4)
ALPHA 2: 0.8 g/dL (ref 0.4–1.0)
Albumin: 3.4 g/dL (ref 2.9–4.4)
BETA: 1 g/dL (ref 0.7–1.3)
GAMMA GLOBULIN: 2.5 g/dL — AB (ref 0.4–1.8)
GLOBULIN, TOTAL: 4.5 g/dL — AB (ref 2.2–3.9)
INTERPRETATION(SEE BELOW): 0
M-SPIKE, %: 1.4 g/dL — AB
Total Protein: 7.9 g/dL (ref 6.0–8.5)

## 2016-09-18 NOTE — Patient Instructions (Signed)
Per Dr. Charlett Blake: Continue current medications & regimen. Complete CMP lab work today. Follow-up with PCP in 3-4 months.

## 2016-09-18 NOTE — Progress Notes (Signed)
Pre visit review using our clinic review tool, if applicable. No additional management support is needed unless otherwise documented below in the visit note.  Patient presents in clinic for blood pressure check per OV note 09/15/16. Reviewed medications with the patient. Today's reading was BP 120/80 P 86.  Per Dr. Charlett Blake: Continue current medications & regimen. Complete CMP lab work today. Follow-up with PCP in 3-4 months.  Informed patient of the provider's instructions. She voiced understanding. Next appointment scheduled for 12/15/16 at 2:15 PM.   RN blood pressure check note reviewed. Agree with documention and plan.  Penni Homans, MD

## 2016-09-18 NOTE — Progress Notes (Signed)
RN blood pressure check note reviewed. Agree with documention and plan. 

## 2016-09-20 NOTE — Assessment & Plan Note (Signed)
Established with hem/onc. No changes will monitor

## 2016-09-20 NOTE — Progress Notes (Signed)
[name], female   DOB: [birth year], 60 y.o.   MRN: [id number]   Subjective:    [name], female    DOB: 07-23-1956, 60 y.o.   MRN: [id number]  Chief Complaint  Patient presents with  . Follow-up    HPI Patient is in today for follow up. She feels mostly well today. She notes some increase in back pain recently. No recent fall or trauma. She has previously been told she had notable arthritis in spine. No recent acute illness or hospitalizations. She continues to endorse anhedonia but does not endorse suicidal ideation. Denies CP/palp/SOB/HA/congestion/fevers/GI or GU c/o. Taking meds as prescribed. She was recently seen in the hospital in [facility] at [facility] for an episode of angioedema. Her ACE I was stopped and she is doing much better.   Past Medical History:  Diagnosis Date  . Abnormal SPEP 07/26/2016  . Arthritis    knees, hands  . Back pain 07/14/2016  . Bowel obstruction 11/2014  . CHF (congestive heart failure) ([place])   . Congestive heart failure ([place]) 02/24/2015   S/p pacemaker  . Depression   . GERD (gastroesophageal reflux disease)   . Hyperlipidemia   . Hypertension   . 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 Obstructive sleep apnea 03/31/2015  . SOB (shortness of breath) 04/09/2016  . Stroke [facility])    TIAs    Past Surgical History:  Procedure Laterality Date  . ABDOMINAL HYSTERECTOMY     menorraghia, 2006, total  . CHOLECYSTECTOMY    . KNEE SURGERY     right, repair torn torn cartialage  . PACEMAKER INSERTION  08/2014  . TONSILLECTOMY    . TUBAL LIGATION      Family History  Problem Relation Age of Onset  . Diabetes Mother   . Hypertension Mother   . Heart disease Mother     s/p 1 stent  . Hyperlipidemia Mother   . Arthritis Mother   . Cancer Father     COLON  . Colon cancer Father 11  . Irritable bowel syndrome Sister   . Hyperlipidemia Daughter   . Hypertension Daughter   .  Hypertension Maternal Grandmother   . Arthritis Maternal Grandmother   . Heart disease Maternal Grandfather     MI  . Hypertension Maternal Grandfather   . Arthritis Maternal Grandfather   . Hypertension Son     Social History   Social History  . Marital status: Married    Spouse name: N/A  . Number of children: N/A  . Years of education: N/A   Occupational History  . [name].   Social History Main Topics  . Smoking status: Never Smoker  . Smokeless tobacco: Never Used  . Alcohol use No  . Drug use: No  . Sexual activity: No     Comment: Lives alone, no dietary restrictions   Other Topics Concern  . [name]   Social History Narrative  . No narrative on file    Outpatient Medications Prior to Visit  Medication Sig Dispense Refill  . albuterol (PROVENTIL HFA;VENTOLIN HFA) 108 (90 Base) MCG/ACT inhaler Inhale 2 puffs into the lungs every 6 (six) hours as needed for wheezing or shortness of breath. 1 Inhaler 1  . alendronate (FOSAMAX) 70 MG tablet Take 1 tablet (70 mg total) by mouth once a week. Take with a full glass of water on an empty stomach. 12 tablet 3  . amLODipine (NORVASC)  5 MG tablet Take 1 tablet (5 mg total) by mouth daily. 90 tablet 2  . baclofen (LIORESAL) 20 MG tablet Take 1 tablet (20 mg total) by mouth 3 (three) times daily as needed for muscle spasms. 90 each 2  . carvedilol (COREG) 25 MG tablet Take 1 tablet (25 mg total) by mouth 2 (two) times daily with a meal. 180 tablet 1  . Cholecalciferol (VITAMIN D3) 5000 UNITS CAPS Take 1 capsule by mouth daily.    Marland Kitchen docusate sodium (COLACE) 50 MG capsule Take 50 mg by mouth 2 (two) times daily.    . ergocalciferol (VITAMIN D2) 50000 units capsule Take 1 capsule (50,000 Units total) by mouth once a week. 12 capsule 1  . escitalopram (LEXAPRO) 20 MG tablet Take 1 tablet (20 mg total) by mouth daily. 90 tablet 1  . fluticasone (FLONASE) 50 MCG/ACT nasal spray Place 2 sprays into both nostrils daily. 48 g 1    . furosemide (LASIX) 20 MG tablet Take 1 tablet (20 mg total) by mouth daily. 90 tablet 1  . gabapentin (NEURONTIN) 300 MG capsule Take 1 capsule (300 mg total) by mouth 3 (three) times daily. 90 capsule 3  . hyoscyamine (LEVSIN SL) 0.125 MG SL tablet Place 1 tablet (0.125 mg total) under the tongue every 4 (four) hours as needed. 90 tablet 01  . ibuprofen (ADVIL,MOTRIN) 600 MG tablet Take 1 tablet (600 mg total) by mouth 2 (two) times daily with a meal. 60 tablet 3  . meclizine (ANTIVERT) 25 MG tablet Take 1 tablet (25 mg total) by mouth 3 (three) times daily as needed for dizziness. 30 tablet 0  . modafinil (PROVIGIL) 100 MG tablet Take 1 tablet (100 mg total) by mouth daily. 30 tablet 5  . Multiple Vitamins-Minerals (CENTRUM SILVER ADULT 50+ PO) Take by mouth.    . naproxen (NAPROSYN) 500 MG tablet Take 1 tablet (500 mg total) by mouth 2 (two) times daily with a meal. 30 tablet 0  . ondansetron (ZOFRAN) 4 MG tablet Take 1 tablet (4 mg total) by mouth every 8 (eight) hours as needed for nausea or vomiting. 90 tablet 1  . pantoprazole (PROTONIX) 40 MG tablet Take 1 tablet (40 mg total) by mouth 2 (two) times daily. 180 tablet 1  . potassium chloride SA (K-DUR,KLOR-CON) 20 MEQ tablet Take 1 tablet (20 mEq total) by mouth daily. 90 tablet 1  . promethazine (PHENERGAN) 25 MG tablet Take 1 tablet (25 mg total) by mouth every 6 (six) hours as needed for nausea or vomiting. 60 tablet 5  . silver sulfADIAZINE (SILVADENE) 1 % cream Apply 1 application topically daily. 50 g 1  . sucralfate (CARAFATE) 1 G tablet Take 1 tablet (1 g total) by mouth 2 (two) times daily. 180 tablet 1  . fluconazole (DIFLUCAN) 150 MG tablet Take 1 tablet (150 mg total) by mouth once. 1 tablet 0  . predniSONE (DELTASONE) 20 MG tablet Take 1 tablet (20 mg total) by mouth 2 (two) times daily with a meal. 10 tablet 0  . zolpidem (AMBIEN) 10 MG tablet Take 0.5-1 tablets (5-10 mg total) by mouth at bedtime as needed for sleep. 30  tablet 1  . amoxicillin (AMOXIL) 500 MG capsule Take 1 capsule (500 mg total) by mouth 3 (three) times daily. 30 capsule 0  . benazepril (LOTENSIN) 20 MG tablet Take 1 tablet (20 mg total) by mouth 2 (two) times daily. 180 tablet 1   No facility-administered medications prior to visit.  Allergies  Allergen Reactions  . Benazepril Anaphylaxis    angioedema  . Codeine Nausea And Vomiting    Review of Systems  Constitutional: Positive for malaise/fatigue. Negative for fever.  HENT: Negative for congestion.   Eyes: Negative for blurred vision.  Respiratory: Negative for shortness of breath.   Cardiovascular: Negative for chest pain, palpitations and leg swelling.  Gastrointestinal: Negative for abdominal pain, blood in stool and nausea.  Genitourinary: Negative for dysuria and frequency.  Musculoskeletal: Positive for back pain. Negative for falls and myalgias.  Skin: Negative for rash.  Neurological: Negative for dizziness, loss of consciousness and headaches.  Endo/Heme/Allergies: Negative for environmental allergies.  Psychiatric/Behavioral: Negative for depression. The patient is not nervous/anxious.        Objective:    Physical Exam  Constitutional: She is oriented to person, place, and time. She appears well-developed and well-nourished. No distress.  HENT:  Head: Normocephalic and atraumatic.  Nose: Nose normal.  Eyes: Right eye exhibits no discharge. Left eye exhibits no discharge.  Neck: Normal range of motion. Neck supple.  Cardiovascular: Normal rate and regular rhythm.   No murmur heard. Pulmonary/Chest: Effort normal and breath sounds normal.  Abdominal: Soft. Bowel sounds are normal. There is no tenderness.  Musculoskeletal: She exhibits no edema.  Neurological: She is alert and oriented to person, place, and time.  Skin: Skin is warm and dry.  Psychiatric: She has a normal mood and affect.  Nursing note and vitals reviewed.   BP (!) 142/98 (BP  Location: Left Arm, Patient Position: Sitting, Cuff Size: Large)   Pulse 95   Temp 98.1 F (36.7 C) (Oral)   Ht 5\' 6"  (1.676 m)   Wt 186 lb (84.4 kg)   SpO2 94%   BMI 30.02 kg/m  Wt Readings from Last 3 Encounters:  09/16/16 184 lb 12.8 oz (83.8 kg)  09/15/16 186 lb (84.4 kg)  08/13/16 182 lb (82.6 kg)     Lab Results  Component Value Date   WBC 2.4 (L) 09/16/2016   HGB 12.2 09/16/2016   HCT 36.6 09/16/2016   PLT 124 (L) 09/16/2016   GLUCOSE 103 (H) 09/18/2016   CHOL 170 04/09/2016   TRIG 72.0 04/09/2016   HDL 59.10 04/09/2016   LDLCALC 97 04/09/2016   ALT 11 09/18/2016   AST 19 09/18/2016   NA 140 09/18/2016   K 3.4 (L) 09/18/2016   CL 105 09/18/2016   CREATININE 0.78 09/18/2016   BUN 19 09/18/2016   CO2 26 09/18/2016   TSH 1.31 07/14/2016    Lab Results  Component Value Date   TSH 1.31 07/14/2016   Lab Results  Component Value Date   WBC 2.4 (L) 09/16/2016   HGB 12.2 09/16/2016   HCT 36.6 09/16/2016   MCV 89 09/16/2016   PLT 124 (L) 09/16/2016   Lab Results  Component Value Date   NA 140 09/18/2016   K 3.4 (L) 09/18/2016   CO2 26 09/18/2016   GLUCOSE 103 (H) 09/18/2016   BUN 19 09/18/2016   CREATININE 0.78 09/18/2016   BILITOT 0.4 09/18/2016   ALKPHOS 41 09/18/2016   AST 19 09/18/2016   ALT 11 09/18/2016   PROT 7.8 09/18/2016   ALBUMIN 3.5 (L) 09/18/2016   CALCIUM 9.1 09/18/2016   GFR 102.23 07/14/2016   Lab Results  Component Value Date   CHOL 170 04/09/2016   Lab Results  Component Value Date   HDL 59.10 04/09/2016   Lab Results  Component Value Date  [place] 97 04/09/2016   Lab Results  Component Value Date   TRIG 72.0 04/09/2016   Lab Results  Component Value Date   CHOLHDL 3 04/09/2016   No results found for: HGBA1C     Assessment & Plan:   Problem List Items Addressed This Visit    Monoclonal gammopathy of unknown significance (MGUS) (Chronic)    Established with hem/onc. No changes will monitor      GERD  (gastroesophageal reflux disease)    Avoid offending foods, start probiotics. Do not eat large meals in late evening and consider raising head of bed.       Hyperlipidemia    Encouraged heart healthy diet, increase exercise, avoid trans fats, consider a krill oil cap daily      Relevant Medications   triamterene-hydrochlorothiazide (MAXZIDE-25) 37.5-25 MG tablet   Hypertension    Well controlled, no changes to meds. Encouraged heart healthy diet such as the DASH diet and exercise as tolerated.       Relevant Medications   triamterene-hydrochlorothiazide (MAXZIDE-25) 37.5-25 MG tablet   Back pain    Encouraged moist heat and gentle stretching as tolerated. May try NSAIDs and prescription meds as directed and report if symptoms worsen or seek immediate care         I have discontinued Ms. Dilone's benazepril and amoxicillin. I am also having her start on triamterene-hydrochlorothiazide. Additionally, I am having her maintain her docusate sodium, Vitamin D3, Multiple Vitamins-Minerals (CENTRUM SILVER ADULT 50+ PO), furosemide, escitalopram, amLODipine, fluticasone, hyoscyamine, modafinil, ondansetron, promethazine, sucralfate, ibuprofen, naproxen, zolpidem, pantoprazole, alendronate, ergocalciferol, potassium chloride SA, meclizine, albuterol, carvedilol, silver sulfADIAZINE, baclofen, and gabapentin.  Meds ordered this encounter  Medications  . triamterene-hydrochlorothiazide (MAXZIDE-25) 37.5-25 MG tablet    Sig: Take 1 tablet by mouth daily.    Dispense:  90 tablet    Refill:  1     Penni Homans, MD

## 2016-09-20 NOTE — Assessment & Plan Note (Signed)
Encouraged heart healthy diet, increase exercise, avoid trans fats, consider a krill oil cap daily 

## 2016-09-20 NOTE — Assessment & Plan Note (Signed)
Encouraged moist heat and gentle stretching as tolerated. May try NSAIDs and prescription meds as directed and report if symptoms worsen or seek immediate care 

## 2016-09-20 NOTE — Assessment & Plan Note (Signed)
Avoid offending foods, start probiotics. Do not eat large meals in late evening and consider raising head of bed.  

## 2016-09-20 NOTE — Assessment & Plan Note (Signed)
Well controlled, no changes to meds. Encouraged heart healthy diet such as the DASH diet and exercise as tolerated.  °

## 2016-09-23 LAB — UIFE/LIGHT CHAINS/TP QN, 24-HR UR
FR KAPPA LT CH,24HR: 6 mg/24 hr
FR LAMBDA LT CH,24HR: 2 mg/(24.h)
FREE KAPPA LT CHAINS, UR: 6.34 mg/L (ref 1.35–24.19)
FREE LAMBDA LT CHAINS, UR: 2.43 mg/L (ref 0.24–6.66)
Kappa/Lambda Ratio,U: 2.61 (ref 2.04–10.37)
PROTEIN,TOTAL,URINE: 11.5 mg/dL
Prot,24hr calculated: 115 mg/24 hr (ref 30–150)

## 2016-09-24 ENCOUNTER — Telehealth: Payer: Self-pay | Admitting: *Deleted

## 2016-09-24 NOTE — Telephone Encounter (Addendum)
Patient is aware of results  ----- Message from [name], MD sent at 09/23/2016  5:59 PM EST ----- Call and tell her that the urine test looks good. No abnormal urine protein is noted. Thank you

## 2016-11-09 HISTORY — PX: COLONOSCOPY: SHX174

## 2016-11-09 HISTORY — PX: ESOPHAGOGASTRODUODENOSCOPY: SHX1529

## 2016-12-11 ENCOUNTER — Telehealth: Payer: Self-pay | Admitting: Hematology & Oncology

## 2016-12-11 NOTE — Telephone Encounter (Signed)
Patient called and cx 12/15/16 apt

## 2016-12-15 ENCOUNTER — Ambulatory Visit: Payer: Medicaid Other | Admitting: Hematology & Oncology

## 2016-12-15 ENCOUNTER — Other Ambulatory Visit: Payer: 59

## 2016-12-15 ENCOUNTER — Ambulatory Visit: Payer: 59 | Admitting: Family Medicine

## 2016-12-31 ENCOUNTER — Ambulatory Visit (INDEPENDENT_AMBULATORY_CARE_PROVIDER_SITE_OTHER): Payer: Medicaid Other | Admitting: Family Medicine

## 2016-12-31 ENCOUNTER — Other Ambulatory Visit (HOSPITAL_BASED_OUTPATIENT_CLINIC_OR_DEPARTMENT_OTHER): Payer: Medicaid Other

## 2016-12-31 ENCOUNTER — Ambulatory Visit (HOSPITAL_BASED_OUTPATIENT_CLINIC_OR_DEPARTMENT_OTHER): Payer: Medicaid Other | Admitting: Hematology & Oncology

## 2016-12-31 ENCOUNTER — Encounter: Payer: Self-pay | Admitting: Family Medicine

## 2016-12-31 VITALS — BP 156/102 | HR 85 | Temp 97.9°F | Wt 183.1 lb

## 2016-12-31 DIAGNOSIS — D472 Monoclonal gammopathy: Secondary | ICD-10-CM

## 2016-12-31 DIAGNOSIS — E785 Hyperlipidemia, unspecified: Secondary | ICD-10-CM

## 2016-12-31 DIAGNOSIS — I429 Cardiomyopathy, unspecified: Secondary | ICD-10-CM

## 2016-12-31 DIAGNOSIS — M791 Myalgia, unspecified site: Secondary | ICD-10-CM | POA: Insufficient documentation

## 2016-12-31 DIAGNOSIS — R1010 Upper abdominal pain, unspecified: Secondary | ICD-10-CM

## 2016-12-31 DIAGNOSIS — I10 Essential (primary) hypertension: Secondary | ICD-10-CM

## 2016-12-31 DIAGNOSIS — M1990 Unspecified osteoarthritis, unspecified site: Secondary | ICD-10-CM

## 2016-12-31 HISTORY — DX: Myalgia, unspecified site: M79.10

## 2016-12-31 LAB — COMPREHENSIVE METABOLIC PANEL
ALBUMIN: 3.7 g/dL (ref 3.5–5.0)
ALK PHOS: 51 U/L (ref 40–150)
ALT: 14 U/L (ref 0–35)
ALT: 16 U/L (ref 0–55)
ANION GAP: 9 meq/L (ref 3–11)
AST: 25 U/L (ref 0–37)
AST: 28 U/L (ref 5–34)
Albumin: 3.9 g/dL (ref 3.5–5.2)
Alkaline Phosphatase: 42 U/L (ref 39–117)
BUN: 22 mg/dL (ref 6–23)
BUN: 22.3 mg/dL (ref 7.0–26.0)
CALCIUM: 9.2 mg/dL (ref 8.4–10.5)
CALCIUM: 9.5 mg/dL (ref 8.4–10.4)
CHLORIDE: 107 meq/L (ref 96–112)
CHLORIDE: 108 meq/L (ref 98–109)
CO2: 26 mEq/L (ref 19–32)
CO2: 24 mEq/L (ref 22–29)
CREATININE: 0.74 mg/dL (ref 0.40–1.20)
Creatinine: 0.8 mg/dL (ref 0.6–1.1)
GFR: 102.08 mL/min (ref >60.00)
Glucose, Bld: 89 mg/dL (ref 70–99)
Glucose: 94 mg/dl (ref 70–140)
POTASSIUM: 3.6 meq/L (ref 3.5–5.1)
Potassium: 3.7 mEq/L (ref 3.5–5.1)
Sodium: 139 mEq/L (ref 135–145)
Sodium: 140 mEq/L (ref 136–145)
Total Bilirubin: 0.4 mg/dL (ref 0.2–1.2)
Total Bilirubin: 0.46 mg/dL (ref 0.20–1.20)
Total Protein: 8.8 g/dL — ABNORMAL HIGH (ref 6.0–8.3)
Total Protein: 9.5 g/dL — ABNORMAL HIGH (ref 6.4–8.3)

## 2016-12-31 LAB — CBC WITH DIFFERENTIAL (CANCER CENTER ONLY)
BASO#: 0 10*3/uL (ref 0.0–0.2)
BASO%: 0.8 % (ref 0.0–2.0)
EOS ABS: 0.1 10*3/uL (ref 0.0–0.5)
EOS%: 1.7 % (ref 0.0–7.0)
HEMATOCRIT: 37.2 % (ref 34.8–46.6)
HGB: 12.2 g/dL (ref 11.6–15.9)
LYMPH#: 1.2 10*3/uL (ref 0.9–3.3)
LYMPH%: 33.7 % (ref 14.0–48.0)
MCH: 29.3 pg (ref 26.0–34.0)
MCHC: 32.8 g/dL (ref 32.0–36.0)
MCV: 89 fL (ref 81–101)
MONO#: 0.2 10*3/uL (ref 0.1–0.9)
MONO%: 5.3 % (ref 0.0–13.0)
NEUT#: 2.1 10*3/uL (ref 1.5–6.5)
NEUT%: 58.5 % (ref 39.6–80.0)
PLATELETS: 103 10*3/uL — AB (ref 145–400)
RBC: 4.16 10*6/uL (ref 3.70–5.32)
RDW: 13.3 % (ref 11.1–15.7)
WBC: 3.6 10*3/uL — ABNORMAL LOW (ref 3.9–10.0)

## 2016-12-31 LAB — TSH: TSH: 1.34 u[IU]/mL (ref 0.35–4.50)

## 2016-12-31 LAB — LACTATE DEHYDROGENASE: LDH: 221 U/L (ref 125–245)

## 2016-12-31 LAB — LIPID PANEL
CHOLESTEROL: 163 mg/dL (ref 0–200)
HDL: 48 mg/dL (ref >39.00)
LDL Cholesterol: 102 mg/dL — ABNORMAL HIGH (ref 0–99)
NonHDL: 115.13
Total CHOL/HDL Ratio: 3
Triglycerides: 65 mg/dL (ref 0.0–149.0)
VLDL: 13 mg/dL (ref 0.0–40.0)

## 2016-12-31 LAB — MAGNESIUM: Magnesium: 1.8 mg/dL (ref 1.5–2.5)

## 2016-12-31 NOTE — Assessment & Plan Note (Signed)
Check magnesium and cmp today

## 2016-12-31 NOTE — Assessment & Plan Note (Signed)
Is having pacer replaced tomorrow by Dr Ola Spurr at [facility] due to malfunction

## 2016-12-31 NOTE — Progress Notes (Signed)
Error

## 2016-12-31 NOTE — Assessment & Plan Note (Signed)
Encouraged heart healthy diet, increase exercise, avoid trans fats, consider a krill oil cap daily 

## 2016-12-31 NOTE — Patient Instructions (Signed)
Hypertension Hypertension, commonly called high blood pressure, is when the force of blood pumping through your arteries is too strong. Your arteries are the blood vessels that carry blood from your heart throughout your body. A blood pressure reading consists of a higher number over a lower number, such as 110/72. The higher number (systolic) is the pressure inside your arteries when your heart pumps. The lower number (diastolic) is the pressure inside your arteries when your heart relaxes. Ideally you want your blood pressure below 120/80. Hypertension forces your heart to work harder to pump blood. Your arteries may become narrow or stiff. Having untreated or uncontrolled hypertension can cause heart attack, stroke, kidney disease, and other problems. What increases the risk? Some risk factors for high blood pressure are controllable. Others are not. Risk factors you cannot control include:  Race. You may be at higher risk if you are African American.  Age. Risk increases with age.  Gender. Men are at higher risk than women before age 45 years. After age 65, women are at higher risk than men. Risk factors you can control include:  Not getting enough exercise or physical activity.  Being overweight.  Getting too much fat, sugar, calories, or salt in your diet.  Drinking too much alcohol. What are the signs or symptoms? Hypertension does not usually cause signs or symptoms. Extremely high blood pressure (hypertensive crisis) may cause headache, anxiety, shortness of breath, and nosebleed. How is this diagnosed? To check if you have hypertension, your health care provider will measure your blood pressure while you are seated, with your arm held at the level of your heart. It should be measured at least twice using the same arm. Certain conditions can cause a difference in blood pressure between your right and left arms. A blood pressure reading that is higher than normal on one occasion does  not mean that you need treatment. If it is not clear whether you have high blood pressure, you may be asked to return on a different day to have your blood pressure checked again. Or, you may be asked to monitor your blood pressure at home for 1 or more weeks. How is this treated? Treating high blood pressure includes making lifestyle changes and possibly taking medicine. Living a healthy lifestyle can help lower high blood pressure. You may need to change some of your habits. Lifestyle changes may include:  Following the DASH diet. This diet is high in fruits, vegetables, and whole grains. It is low in salt, red meat, and added sugars.  Keep your sodium intake below 2,300 mg per day.  Getting at least 30-45 minutes of aerobic exercise at least 4 times per week.  Losing weight if necessary.  Not smoking.  Limiting alcoholic beverages.  Learning ways to reduce stress. Your health care provider may prescribe medicine if lifestyle changes are not enough to get your blood pressure under control, and if one of the following is true:  You are 18-59 years of age and your systolic blood pressure is above 140.  You are 60 years of age or older, and your systolic blood pressure is above 150.  Your diastolic blood pressure is above 90.  You have diabetes, and your systolic blood pressure is over 140 or your diastolic blood pressure is over 90.  You have kidney disease and your blood pressure is above 140/90.  You have heart disease and your blood pressure is above 140/90. Your personal target blood pressure may vary depending on your medical   conditions, your age, and other factors. Follow these instructions at home:  Have your blood pressure rechecked as directed by your health care provider.  Take medicines only as directed by your health care provider. Follow the directions carefully. Blood pressure medicines must be taken as prescribed. The medicine does not work as well when you skip  doses. Skipping doses also puts you at risk for problems.  Do not smoke.  Monitor your blood pressure at home as directed by your health care provider. Contact a health care provider if:  You think you are having a reaction to medicines taken.  You have recurrent headaches or feel dizzy.  You have swelling in your ankles.  You have trouble with your vision. Get help right away if:  You develop a severe headache or confusion.  You have unusual weakness, numbness, or feel faint.  You have severe chest or abdominal pain.  You vomit repeatedly.  You have trouble breathing. This information is not intended to replace advice given to you by your health care provider. Make sure you discuss any questions you have with your health care provider. Document Released: 10/26/2005 Document Revised: 04/02/2016 Document Reviewed: 08/18/2013 Elsevier Interactive Patient Education  2017 Elsevier Inc.  

## 2016-12-31 NOTE — Assessment & Plan Note (Signed)
Notes some increase in LBP as she has been less active for now moist heat and Lidocaine patches. After her surgery can consider PT or sports med as able

## 2016-12-31 NOTE — Assessment & Plan Note (Signed)
Sees hematology today and is doing well

## 2016-12-31 NOTE — Progress Notes (Signed)
[name], female   DOB: May 25, 1956, 61 y.o.   MRN: [id number]   Subjective:    [name], female    DOB: [birth year], 61 y.o.   MRN: [id number] I acted as a Education administrator for Dr. Charlett Blake. [place], [place]  Chief Complaint  Patient presents with  . Follow-up  . Hypertension  . Hyperlipidemia    Hypertension  This is a chronic problem. The problem has been gradually improving since onset. Associated symptoms include malaise/fatigue. Pertinent negatives include no blurred vision, chest pain, headaches, palpitations or shortness of breath.  Hyperlipidemia  Associated symptoms include myalgias. Pertinent negatives include no chest pain or shortness of breath.   Patient is in today for follow up. She has a surgery scheduled for tomorrow at [facility] for her pacemaker to be changed. Her current one has been malfunctioning so her cardiologist Dr Ola Spurr will take her to surgery tomorrow. She denies any acute concerns, recent febrile illness or hospitalizations. She continues to endorse fatigue and is anxious to proceed with surgery. She also notes intermittent neck and low back pain and some numbness in her feet at times. Denies CP/palp/SOB/HA/congestion/fevers/GI or GU c/o. Taking meds as prescribed  Past Medical History:  Diagnosis Date  . Abnormal SPEP 07/26/2016  . Arthritis    knees, hands  . Back pain 07/14/2016  . Bowel obstruction 11/2014  . CHF (congestive heart failure) ([place])   . Congestive heart failure ([place]) 02/24/2015   S/p pacemaker  . Depression   . GERD (gastroesophageal reflux disease)   . Hyperlipidemia   . Hypertension   . 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 Myalgia 12/31/2016  . Obstructive sleep apnea 03/31/2015  . SOB (shortness of breath) 04/09/2016  . Stroke [facility])    TIAs    Past Surgical History:  Procedure Laterality Date  . ABDOMINAL HYSTERECTOMY     menorraghia, 2006, total  . CHOLECYSTECTOMY    . KNEE  SURGERY     right, repair torn torn cartialage  . PACEMAKER INSERTION  08/2014  . TONSILLECTOMY    . TUBAL LIGATION      Family History  Problem Relation Age of Onset  . Diabetes Mother   . Hypertension Mother   . Heart disease Mother     s/p 1 stent  . Hyperlipidemia Mother   . Arthritis Mother   . Cancer Father     COLON  . Colon cancer Father 54  . Irritable bowel syndrome Sister   . Hyperlipidemia Daughter   . Hypertension Daughter   . Hypertension Maternal Grandmother   . Arthritis Maternal Grandmother   . Heart disease Maternal Grandfather     MI  . Hypertension Maternal Grandfather   . Arthritis Maternal Grandfather   . Hypertension Son     Social History   Social History  . Marital status: Married    Spouse name: N/A  . Number of children: N/A  . Years of education: N/A   Occupational History  . [name].   Social History Main Topics  . Smoking status: Never Smoker  . Smokeless tobacco: Never Used  . Alcohol use No  . Drug use: No  . Sexual activity: No     Comment: Lives alone, no dietary restrictions   Other Topics Concern  . [name]   Social History Narrative  . No narrative on file    Outpatient Medications Prior to Visit  Medication Sig Dispense Refill  .  albuterol (PROVENTIL HFA;VENTOLIN HFA) 108 (90 Base) MCG/ACT inhaler Inhale 2 puffs into the lungs every 6 (six) hours as needed for wheezing or shortness of breath. 1 Inhaler 1  . alendronate (FOSAMAX) 70 MG tablet Take 1 tablet (70 mg total) by mouth once a week. Take with a full glass of water on an empty stomach. 12 tablet 3  . amLODipine (NORVASC) 5 MG tablet Take 1 tablet (5 mg total) by mouth daily. 90 tablet 2  . baclofen (LIORESAL) 20 MG tablet Take 1 tablet (20 mg total) by mouth 3 (three) times daily as needed for muscle spasms. 90 each 2  . carvedilol (COREG) 25 MG tablet Take 1 tablet (25 mg total) by mouth 2 (two) times daily with a meal. 180 tablet 1  . Cholecalciferol  (VITAMIN D3) 5000 UNITS CAPS Take 1 capsule by mouth daily.    Marland Kitchen docusate sodium (COLACE) 50 MG capsule Take 50 mg by mouth 2 (two) times daily.    . ergocalciferol (VITAMIN D2) 50000 units capsule Take 1 capsule (50,000 Units total) by mouth once a week. 12 capsule 1  . escitalopram (LEXAPRO) 20 MG tablet Take 1 tablet (20 mg total) by mouth daily. 90 tablet 1  . fluticasone (FLONASE) 50 MCG/ACT nasal spray Place 2 sprays into both nostrils daily. (Patient taking differently: Place 2 sprays into both nostrils as needed. ) 48 g 1  . furosemide (LASIX) 20 MG tablet Take 1 tablet (20 mg total) by mouth daily. (Patient taking differently: Take 20 mg by mouth daily as needed. ) 90 tablet 1  . gabapentin (NEURONTIN) 300 MG capsule Take 1 capsule (300 mg total) by mouth 3 (three) times daily. 90 capsule 3  . hyoscyamine (LEVSIN SL) 0.125 MG SL tablet Place 1 tablet (0.125 mg total) under the tongue every 4 (four) hours as needed. 90 tablet 01  . ibuprofen (ADVIL,MOTRIN) 600 MG tablet Take 1 tablet (600 mg total) by mouth 2 (two) times daily with a meal. 60 tablet 3  . meclizine (ANTIVERT) 25 MG tablet Take 1 tablet (25 mg total) by mouth 3 (three) times daily as needed for dizziness. 30 tablet 0  . modafinil (PROVIGIL) 100 MG tablet Take 1 tablet (100 mg total) by mouth daily. 30 tablet 5  . Multiple Vitamins-Minerals (CENTRUM SILVER ADULT 50+ PO) Take by mouth.    . naproxen (NAPROSYN) 500 MG tablet Take 1 tablet (500 mg total) by mouth 2 (two) times daily with a meal. 30 tablet 0  . ondansetron (ZOFRAN) 4 MG tablet Take 1 tablet (4 mg total) by mouth every 8 (eight) hours as needed for nausea or vomiting. 90 tablet 1  . pantoprazole (PROTONIX) 40 MG tablet Take 1 tablet (40 mg total) by mouth 2 (two) times daily. 180 tablet 1  . potassium chloride SA (K-DUR,KLOR-CON) 20 MEQ tablet Take 1 tablet (20 mEq total) by mouth daily. 90 tablet 1  . promethazine (PHENERGAN) 25 MG tablet Take 1 tablet (25 mg total)  by mouth every 6 (six) hours as needed for nausea or vomiting. 60 tablet 5  . silver sulfADIAZINE (SILVADENE) 1 % cream Apply 1 application topically daily. 50 g 1  . sucralfate (CARAFATE) 1 G tablet Take 1 tablet (1 g total) by mouth 2 (two) times daily. 180 tablet 1  . triamterene-hydrochlorothiazide (MAXZIDE-25) 37.5-25 MG tablet Take 1 tablet by mouth daily. 90 tablet 1  . zolpidem (AMBIEN) 10 MG tablet Take 0.5-1 tablets (5-10 mg total) by mouth at bedtime  as needed for sleep. 30 tablet 1   No facility-administered medications prior to visit.     Allergies  Allergen Reactions  . Benazepril Anaphylaxis    angioedema  . Codeine Nausea And Vomiting    Review of Systems  Constitutional: Positive for malaise/fatigue. Negative for fever.  HENT: Negative for congestion.   Eyes: Negative for blurred vision.  Respiratory: Negative for cough and shortness of breath.   Cardiovascular: Negative for chest pain, palpitations and leg swelling.  Gastrointestinal: Negative for vomiting.  Musculoskeletal: Positive for back pain and myalgias.  Skin: Negative for rash.  Neurological: Negative for loss of consciousness and headaches.       Objective:    Physical Exam  Constitutional: She is oriented to person, place, and time. She appears well-developed and well-nourished. No distress.  HENT:  Head: Normocephalic and atraumatic.  Eyes: Conjunctivae are normal.  Neck: Normal range of motion. No thyromegaly present.  Cardiovascular: Normal rate and regular rhythm.   Pulmonary/Chest: Effort normal and breath sounds normal. She has no wheezes.  Abdominal: Soft. Bowel sounds are normal. There is no tenderness.  Musculoskeletal: Normal range of motion. She exhibits no edema or deformity.  Neurological: She is alert and oriented to person, place, and time.  Skin: Skin is warm and dry. She is not diaphoretic.  Psychiatric: She has a normal mood and affect.    There were no vitals taken for this  visit. Wt Readings from Last 3 Encounters:  12/31/16 183 lb 1.9 oz (83.1 kg)  09/16/16 184 lb 12.8 oz (83.8 kg)  09/15/16 186 lb (84.4 kg)     Lab Results  Component Value Date   WBC 3.6 (L) 12/31/2016   HGB 12.2 12/31/2016   HCT 37.2 12/31/2016   PLT 103 (L) 12/31/2016   GLUCOSE 103 (H) 09/18/2016   CHOL 170 04/09/2016   TRIG 72.0 04/09/2016   HDL 59.10 04/09/2016   LDLCALC 97 04/09/2016   ALT 11 09/18/2016   AST 19 09/18/2016   NA 140 09/18/2016   K 3.4 (L) 09/18/2016   CL 105 09/18/2016   CREATININE 0.78 09/18/2016   BUN 19 09/18/2016   CO2 26 09/18/2016   TSH 1.31 07/14/2016    Lab Results  Component Value Date   TSH 1.31 07/14/2016   Lab Results  Component Value Date   WBC 3.6 (L) 12/31/2016   HGB 12.2 12/31/2016   HCT 37.2 12/31/2016   MCV 89 12/31/2016   PLT 103 (L) 12/31/2016   Lab Results  Component Value Date   NA 140 09/18/2016   K 3.4 (L) 09/18/2016   CO2 26 09/18/2016   GLUCOSE 103 (H) 09/18/2016   BUN 19 09/18/2016   CREATININE 0.78 09/18/2016   BILITOT 0.4 09/18/2016   ALKPHOS 41 09/18/2016   AST 19 09/18/2016   ALT 11 09/18/2016   PROT 7.8 09/18/2016   ALBUMIN 3.5 (L) 09/18/2016   CALCIUM 9.1 09/18/2016   GFR 102.23 07/14/2016   Lab Results  Component Value Date   CHOL 170 04/09/2016   Lab Results  Component Value Date   HDL 59.10 04/09/2016   Lab Results  Component Value Date   LDLCALC 97 04/09/2016   Lab Results  Component Value Date   TRIG 72.0 04/09/2016   Lab Results  Component Value Date   CHOLHDL 3 04/09/2016   No results found for: HGBA1C     Assessment & Plan:   Problem List Items Addressed This Visit    Monoclonal  gammopathy of unknown significance (MGUS) (Chronic)    Sees hematology today and is doing well      Arthritis    Notes some increase in LBP as she has been less active for now moist heat and Lidocaine patches. After her surgery can consider PT or sports med as able      Hyperlipidemia     Encouraged heart healthy diet, increase exercise, avoid trans fats, consider a krill oil cap daily      Relevant Orders   Lipid panel   Hypertension    Well controlled, no changes to meds. Encouraged heart healthy diet such as the DASH diet and exercise as tolerated.       Relevant Orders   Comprehensive metabolic panel   TSH   Cardiomyopathy [facility])    Is having pacer replaced tomorrow by Dr Ola Spurr at [facility] due to malfunction      Myalgia    Check magnesium and cmp today      Relevant Orders   Magnesium      I am having Ms. Sheck maintain her docusate sodium, Vitamin D3, Multiple Vitamins-Minerals (CENTRUM SILVER ADULT 50+ PO), furosemide, escitalopram, amLODipine, fluticasone, hyoscyamine, modafinil, ondansetron, promethazine, sucralfate, ibuprofen, naproxen, zolpidem, pantoprazole, alendronate, ergocalciferol, potassium chloride SA, meclizine, albuterol, carvedilol, silver sulfADIAZINE, baclofen, gabapentin, and triamterene-hydrochlorothiazide.  No orders of the defined types were placed in this encounter.   CMA served as Education administrator during this visit. History, Physical and Plan performed by medical provider. Documentation and orders reviewed and attested to.  Penni Homans, MD

## 2016-12-31 NOTE — Assessment & Plan Note (Signed)
Well controlled, no changes to meds. Encouraged heart healthy diet such as the DASH diet and exercise as tolerated.  °

## 2016-12-31 NOTE — Progress Notes (Signed)
Pre visit review using our clinic review tool, if applicable. No additional management support is needed unless otherwise documented below in the visit note. 

## 2016-12-31 NOTE — Progress Notes (Signed)
Hematology and Oncology Follow Up Visit  Tami [name] [id number] Feb 22, 1956 61 y.o. 12/31/2016   Principle Diagnosis:   IgG Kappa MGUS  Current Therapy:    Observation     Interim History:  Tami [name] is back for follow-up. She goes in for pacemaker exchange tomorrow. This will be at [facility]. She should do okay with this.  He last saw her back in November. Her M spike at that time was 1.4 g/dL. Her IgG level was 2419 Milligrams per deciliter. Her Kappa Lightchain was 7.5 mg/dL.  He 24 hour urine was done. This showed a 24-hour Kappa Lightchain of 6 mg per day.   Otherwise, everything is doing okay. She does have arthritis. The arthritis does bother her. The change in weather so has been a little bit tough on her.  She is now retired. 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  She's had no issues with fever. There's been no cough or shortness of breath. She's had no nausea or vomiting patient had no change in bowel or bladder habits.  She has some complaints of upper abdominal pain. She has been seeing gastroenterology for this.  she has had upper endoscopy. She said everything came out okay. Somewhat she may have scar tissue from past gallbladder surgery.  Overall, her performance status is ECOG 1   Medications:  Current Outpatient Prescriptions:  .  albuterol (PROVENTIL HFA;VENTOLIN HFA) 108 (90 Base) MCG/ACT inhaler, Inhale 2 puffs into the lungs every 6 (six) hours as needed for wheezing or shortness of breath., Disp: 1 Inhaler, Rfl: 1 .  alendronate (FOSAMAX) 70 MG tablet, Take 1 tablet (70 mg total) by mouth once a week. Take with a full glass of water on an empty stomach., Disp: 12 tablet, Rfl: 3 .  amLODipine (NORVASC) 5 MG tablet, Take 1 tablet (5 mg total) by mouth daily., Disp: 90 tablet, Rfl: 2 .  baclofen (LIORESAL) 20 MG tablet, Take 1 tablet (20 mg total) by mouth 3 (three) times daily as needed for muscle spasms., Disp: 90 each, Rfl: 2 .  carvedilol  (COREG) 25 MG tablet, Take 1 tablet (25 mg total) by mouth 2 (two) times daily with a meal., Disp: 180 tablet, Rfl: 1 .  Cholecalciferol (VITAMIN D3) 5000 UNITS CAPS, Take 1 capsule by mouth daily., Disp: , Rfl:  .  docusate sodium (COLACE) 50 MG capsule, Take 50 mg by mouth 2 (two) times daily., Disp: , Rfl:  .  ergocalciferol (VITAMIN D2) 50000 units capsule, Take 1 capsule (50,000 Units total) by mouth once a week., Disp: 12 capsule, Rfl: 1 .  escitalopram (LEXAPRO) 20 MG tablet, Take 1 tablet (20 mg total) by mouth daily., Disp: 90 tablet, Rfl: 1 .  fluticasone (FLONASE) 50 MCG/ACT nasal spray, Place 2 sprays into both nostrils daily. (Patient taking differently: Place 2 sprays into both nostrils as needed. ), Disp: 48 g, Rfl: 1 .  furosemide (LASIX) 20 MG tablet, Take 1 tablet (20 mg total) by mouth daily. (Patient taking differently: Take 20 mg by mouth daily as needed. ), Disp: 90 tablet, Rfl: 1 .  gabapentin (NEURONTIN) 300 MG capsule, Take 1 capsule (300 mg total) by mouth 3 (three) times daily., Disp: 90 capsule, Rfl: 3 .  hyoscyamine (LEVSIN SL) 0.125 MG SL tablet, Place 1 tablet (0.125 mg total) under the tongue every 4 (four) hours as needed., Disp: 90 tablet, Rfl: 01 .  ibuprofen (ADVIL,MOTRIN) 600 MG tablet, Take 1 tablet (600 mg total) by  mouth 2 (two) times daily with a meal., Disp: 60 tablet, Rfl: 3 .  meclizine (ANTIVERT) 25 MG tablet, Take 1 tablet (25 mg total) by mouth 3 (three) times daily as needed for dizziness., Disp: 30 tablet, Rfl: 0 .  modafinil (PROVIGIL) 100 MG tablet, Take 1 tablet (100 mg total) by mouth daily., Disp: 30 tablet, Rfl: 5 .  Multiple Vitamins-Minerals (CENTRUM SILVER ADULT 50+ PO), Take by mouth., Disp: , Rfl:  .  naproxen (NAPROSYN) 500 MG tablet, Take 1 tablet (500 mg total) by mouth 2 (two) times daily with a meal., Disp: 30 tablet, Rfl: 0 .  ondansetron (ZOFRAN) 4 MG tablet, Take 1 tablet (4 mg total) by mouth every 8 (eight) hours as needed for nausea  or vomiting., Disp: 90 tablet, Rfl: 1 .  pantoprazole (PROTONIX) 40 MG tablet, Take 1 tablet (40 mg total) by mouth 2 (two) times daily., Disp: 180 tablet, Rfl: 1 .  potassium chloride SA (K-DUR,KLOR-CON) 20 MEQ tablet, Take 1 tablet (20 mEq total) by mouth daily., Disp: 90 tablet, Rfl: 1 .  promethazine (PHENERGAN) 25 MG tablet, Take 1 tablet (25 mg total) by mouth every 6 (six) hours as needed for nausea or vomiting., Disp: 60 tablet, Rfl: 5 .  silver sulfADIAZINE (SILVADENE) 1 % cream, Apply 1 application topically daily., Disp: 50 g, Rfl: 1 .  sucralfate (CARAFATE) 1 G tablet, Take 1 tablet (1 g total) by mouth 2 (two) times daily., Disp: 180 tablet, Rfl: 1 .  triamterene-hydrochlorothiazide (MAXZIDE-25) 37.5-25 MG tablet, Take 1 tablet by mouth daily., Disp: 90 tablet, Rfl: 1 .  zolpidem (AMBIEN) 10 MG tablet, Take 0.5-1 tablets (5-10 mg total) by mouth at bedtime as needed for sleep., Disp: 30 tablet, Rfl: 1  Allergies:  Allergies  Allergen Reactions  . Benazepril Anaphylaxis    angioedema  . Codeine Nausea And Vomiting    Past Medical History, Surgical history, Social history, and Family History were reviewed and updated.  Review of Systems:  As above   Physical Exam:  weight is 183 lb 1.9 oz (83.1 kg). Her oral temperature is 97.9 F (36.6 C). Her blood pressure is 156/102 (abnormal) and her pulse is 85.   Wt Readings from Last 3 Encounters:  12/31/16 183 lb 1.9 oz (83.1 kg)  09/16/16 184 lb 12.8 oz (83.8 kg)  09/15/16 186 lb (84.4 kg)      Well-developed and well-nourished African-American female in no obvious distress. Head exam shows no ocular or oral lesions. There are no palpable cervical or supraclavicular lymph nodes. Lungs are clear. Cardiac exam regular rate and rhythm with no murmurs, rubs or bruits. Abdomen is soft. She has good bowel sounds. There may be some tenderness to palpation in the epigastric area. She has no palpable liver or spleen tip. Back exam shows  no tenderness over the spine, ribs or hips. Extremity shows some trace edema in her lower legs. Neurological exam shows no focal neurological deficits. Skin exam shows no rashes, ecchymoses or petechia.   Lab Results  Component Value Date   WBC 3.6 (L) 12/31/2016   HGB 12.2 12/31/2016   HCT 37.2 12/31/2016   MCV 89 12/31/2016   PLT 103 (L) 12/31/2016     Chemistry      Component Value Date/Time   NA 140 09/18/2016 1152   NA 139 09/16/2016 1422   K 3.4 (L) 09/18/2016 1152   K 3.6 09/16/2016 1422   CL 105 09/18/2016 1152   CL 105 09/16/2016 1422  CO2 26 09/18/2016 1152   CO2 27 09/16/2016 1422   BUN 19 09/18/2016 1152   BUN 17 09/16/2016 1422   CREATININE 0.78 09/18/2016 1152      Component Value Date/Time   CALCIUM 9.1 09/18/2016 1152   CALCIUM 8.9 09/16/2016 1422   ALKPHOS 41 09/18/2016 1152   ALKPHOS 44 09/16/2016 1422   AST 19 09/18/2016 1152   AST 22 09/16/2016 1422   ALT 11 09/18/2016 1152   ALT 20 09/16/2016 1422   BILITOT 0.4 09/18/2016 1152   BILITOT 0.60 09/16/2016 1422         Impression and Plan: Tami [name] is a 61 year old African-American female. She has a monoclonal gammopathy. I would say this is an IgG kappa MGUS. I do not see anything that looks like progression to myeloma.   We will have to see what her monoclonal studies look like.  I'll plan to get her back in 3 more months. Again I don't see any problems with her having surgery for the pacemaker removal and exchange. Her platelet count is down a little bit. I would think this is more from her medications then from the smoldering myeloma.   [name], MD 2/22/201811:23 AM

## 2017-01-01 LAB — BETA 2 MICROGLOBULIN, SERUM: Beta-2: 1.5 mg/L (ref 0.6–2.4)

## 2017-01-01 LAB — KAPPA/LAMBDA LIGHT CHAINS
Ig Kappa Free Light Chain: 56.4 mg/L — ABNORMAL HIGH (ref 3.3–19.4)
Ig Lambda Free Light Chain: 10.9 mg/L (ref 5.7–26.3)
Kappa/Lambda FluidC Ratio: 5.17 — ABNORMAL HIGH (ref 0.26–1.65)

## 2017-01-01 LAB — IGG, IGA, IGM
IgA, Qn, Serum: 289 mg/dL (ref 87–352)
IgM, Qn, Serum: 156 mg/dL (ref 26–217)

## 2017-01-05 LAB — PROTEIN ELECTROPHORESIS, SERUM, WITH REFLEX
A/G RATIO SPE: 0.7 (ref 0.7–1.7)
ALBUMIN: 3.5 g/dL (ref 2.9–4.4)
Alpha 1: 0.2 g/dL (ref 0.0–0.4)
Alpha 2: 0.8 g/dL (ref 0.4–1.0)
BETA: 1.1 g/dL (ref 0.7–1.3)
Gamma Globulin: 3 g/dL — ABNORMAL HIGH (ref 0.4–1.8)
Globulin, Total: 5.2 g/dL — ABNORMAL HIGH (ref 2.2–3.9)
INTERPRETATION(SEE BELOW): 0
M-Spike, %: 2.2 g/dL — ABNORMAL HIGH
TOTAL PROTEIN: 8.7 g/dL — AB (ref 6.0–8.5)

## 2017-01-12 ENCOUNTER — Ambulatory Visit: Payer: 59 | Admitting: Family Medicine

## 2017-02-18 ENCOUNTER — Telehealth: Payer: Self-pay

## 2017-02-18 DIAGNOSIS — R928 Other abnormal and inconclusive findings on diagnostic imaging of breast: Secondary | ICD-10-CM

## 2017-02-18 DIAGNOSIS — Z1239 Encounter for other screening for malignant neoplasm of breast: Secondary | ICD-10-CM

## 2017-02-18 NOTE — Telephone Encounter (Signed)
Please change her b/l mgm to diagnositic at [facility]

## 2017-02-18 NOTE — Telephone Encounter (Signed)
[facility]  Right 8'oclcok position at the middle third depth there is an obscured round sub cm mass. Active not appear significantly different when compared to studies performed in 2015 and 2014.    Orders have been placed for bilateral diagnostic and right [place].   PC

## 2017-02-19 ENCOUNTER — Ambulatory Visit: Payer: Medicaid Other | Admitting: Family Medicine

## 2017-02-19 ENCOUNTER — Other Ambulatory Visit: Payer: Self-pay | Admitting: Family Medicine

## 2017-02-19 DIAGNOSIS — N6310 Unspecified lump in the right breast, unspecified quadrant: Secondary | ICD-10-CM

## 2017-02-19 NOTE — Addendum Note (Signed)
Addended by: Magdalene Molly A on: 02/19/2017 08:30 AM   Modules accepted: Orders

## 2017-02-25 ENCOUNTER — Encounter: Payer: Self-pay | Admitting: Family Medicine

## 2017-03-17 ENCOUNTER — Other Ambulatory Visit: Payer: Self-pay

## 2017-03-17 ENCOUNTER — Other Ambulatory Visit: Payer: Self-pay | Admitting: Family Medicine

## 2017-03-17 MED ORDER — AMLODIPINE BESYLATE 5 MG PO TABS: 5.0000 mg | ORAL_TABLET | Freq: Every day | ORAL | 3 refills | Status: DC

## 2017-03-17 MED ORDER — CARVEDILOL 25 MG PO TABS: 25.0000 mg | ORAL_TABLET | Freq: Two times a day (BID) | ORAL | 1 refills | Status: DC

## 2017-03-17 NOTE — Progress Notes (Signed)
Per Dr. Frederik Pear instructions, Patient is to take Amlodipine 5mg  (take 1 tab po qd) and Carvedilol 25mg  (take 1 tab po qd) #30.    Rx sent into the [facility] on [address] in [place], [place]: [phone].  Spoke with Patient today and advised on of Dr. Frederik Pear instructions and reminded her of her appointment with Dr. Charlett Blake on 03/22/2017 at 8:15am. Patient verbalized understanding.

## 2017-03-17 NOTE — Telephone Encounter (Signed)
Pt says that she had a ED visit yesterday due to her BP going to high. Pt says that PCP placed her on a new BP medication and she need a refill on it. Pt says that she threw away the bottle so she's unsure of the name. Pt says that she need medication sent in to pharmacy as soon as possible because she feels that her BP is still high.    Please assist and advise further.    Pharmacy: [facility]: [phone]

## 2017-03-22 ENCOUNTER — Ambulatory Visit (INDEPENDENT_AMBULATORY_CARE_PROVIDER_SITE_OTHER): Payer: Medicaid Other | Admitting: Family Medicine

## 2017-03-22 ENCOUNTER — Encounter: Payer: Self-pay | Admitting: Family Medicine

## 2017-03-22 DIAGNOSIS — M545 Low back pain, unspecified: Secondary | ICD-10-CM

## 2017-03-22 DIAGNOSIS — D472 Monoclonal gammopathy: Secondary | ICD-10-CM | POA: Diagnosis not present

## 2017-03-22 DIAGNOSIS — G4733 Obstructive sleep apnea (adult) (pediatric): Secondary | ICD-10-CM | POA: Diagnosis not present

## 2017-03-22 DIAGNOSIS — R1013 Epigastric pain: Secondary | ICD-10-CM | POA: Diagnosis not present

## 2017-03-22 DIAGNOSIS — E785 Hyperlipidemia, unspecified: Secondary | ICD-10-CM | POA: Diagnosis not present

## 2017-03-22 DIAGNOSIS — I10 Essential (primary) hypertension: Secondary | ICD-10-CM | POA: Diagnosis not present

## 2017-03-22 HISTORY — DX: Epigastric pain: R10.13

## 2017-03-22 MED ORDER — TIZANIDINE HCL 4 MG PO TABS: 2.0000 mg | ORAL_TABLET | Freq: Every evening | ORAL | 1 refills | Status: DC | PRN

## 2017-03-22 NOTE — Patient Instructions (Addendum)
Zyrtec/Cetirizine 10 mg daily during allergy season.  Try saline nasal spray  Try Mylanta as needed for epigastric pain Lidocaine gel or patch Allergies, Adult An allergy is when your body's defense system (immune system) overreacts to an otherwise harmless substance (allergen) that you breathe in or eat or something that touches your skin. When you come into contact with something that you are allergic to, your immune system produces certain proteins (antibodies). These proteins cause cells to release chemicals (histamines) that trigger the symptoms of an allergic reaction. Allergies often affect the nasal passages (allergic rhinitis), eyes (allergic conjunctivitis), skin (atopic dermatitis), and stomach. Allergies can be mild or severe. Allergies cannot spread from person to person (are not contagious). They can develop at any age and may be outgrown. What are the causes? Allergies can be caused by any substance that your immune system mistakenly targets as harmful. These may include:  Outdoor allergens, such as pollen, grass, weeds, car exhaust, and mold spores.  Indoor allergens, such as dust, smoke, mold, and pet dander.  Foods, especially peanuts, milk, eggs, fish, shellfish, soy, nuts, and wheat.  Medicines, such as penicillin.  Skin irritants, such as detergents, chemicals, and latex.  Perfume.  Insect bites or stings. What increases the risk? You may be at greater risk of allergies if other people in your family have allergies. What are the signs or symptoms? Symptoms depend on what type of allergy you have. They may include:  Runny, stuffy nose.  Sneezing.  Itchy mouth, ears, or throat.  Postnasal drip.  Sore throat.  Itchy, red, watery, or puffy eyes.  Skin rash or hives.  Stomach pain.  Vomiting.  Diarrhea.  Bloating.  Wheezing or coughing. People with a severe allergy to food, medicine, or an insect bite may have a life-threatening allergic reaction  (anaphylaxis). Symptoms of anaphylaxis include:  Hives.  Itching.  Flushed face.  Swollen lips, tongue, or mouth.  Tight or swollen throat.  Chest pain or tightness in the chest.  Trouble breathing or shortness of breath.  Rapid heartbeat.  Dizziness or fainting.  Vomiting.  Diarrhea.  Pain in the abdomen. How is this diagnosed? This condition is diagnosed based on:  Your symptoms.  Your family and medical history.  A physical exam. You may need to see a health care provider who specializes in treating allergies (allergist). You may also have tests, including:  Skin tests to see which allergens are causing your symptoms, such as:  Skin prick test. In this test, your skin is pricked with a tiny needle and exposed to small amounts of possible allergens to see if your skin reacts.  Intradermal skin test. In this test, a small amount of allergen is injected under your skin to see if your skin reacts.  Patch test. In this test, a small amount of allergen is placed on your skin and then your skin is covered with a bandage. Your health care provider will check your skin after a couple of days to see if a rash has developed.  Blood tests.  Challenges tests. In this test, you inhale a small amount of allergen by mouth to see if you have an allergic reaction. You may also be asked to:  Keep a food diary. A food diary is a record of all the foods and drinks you have in a day and any symptoms you experience.  Practice an elimination diet. An elimination diet involves eliminating specific foods from your diet and then adding them back in one  by one to find out if a certain food causes an allergic reaction. How is this treated? Treatment for allergies depends on your symptoms. Treatment may include:  Cold compresses to soothe itching and swelling.  Eye drops.  Nasal sprays.  Using a saline spray or container (neti pot) to flush out the nose (nasal irrigation). These  methods can help clear away mucus and keep the nasal passages moist.  Using a humidifier.  Oral antihistamines or other medicines to block allergic reaction and inflammation.  Skin creams to treat rashes or itching.  Diet changes to eliminate food allergy triggers.  Repeated exposure to tiny amounts of allergens to build up a tolerance and prevent future allergic reactions (immunotherapy). These include:  Allergy shots.  Oral treatment. This involves taking small doses of an allergen under the tongue (sublingual immunotherapy).  Emergency epinephrine injection (auto-injector) in case of an allergic emergency. This is a self-injectable, pre-measured medicine that must be given within the first few minutes of a serious allergic reaction. Follow these instructions at home:  Avoid known allergens whenever possible.  If you suffer from airborne allergens, wash out your nose daily. You can do this with a saline spray or a neti pot to flush out your nose (nasal irrigation).  Take over-the-counter and prescription medicines only as told by your health care provider.  Keep all follow-up visits as told by your health care provider. This is important.  If you are at risk of a severe allergic reaction (anaphylaxis), keep your auto-injector with you at all times.  If you have ever had anaphylaxis, wear a medical alert bracelet or necklace that states you have a severe allergy. Contact a health care provider if:  Your symptoms do not improve with treatment. Get help right away if:  You have symptoms of anaphylaxis, such as:  Swollen mouth, tongue, or throat.  Pain or tightness in your chest.  Trouble breathing or shortness of breath.  Dizziness or fainting.  Severe abdominal pain, vomiting, or diarrhea. This information is not intended to replace advice given to you by your health care provider. Make sure you discuss any questions you have with your health care provider. Document  Released: 01/19/2003 Document Revised: 06/25/2016 Document Reviewed: 05/13/2016 Elsevier Interactive Patient Education  2017 Else[employer] Inc[employer]  Back Pain, Adult Back pain is very common in adults.The cause of back pain is rarely dangerous and the pain often gets better over time.The cause of your back pain may not be known. Some common causes of back pain include: Strain of the muscles or ligaments supporting the spine. Wear and tear (degeneration) of the spinal disks. Arthritis. Direct injury to the back. For many people, back pain may return. Since back pain is rarely dangerous, most people can learn to manage this condition on their own. Follow these instructions at home: Watch your back pain for any changes. The following actions may help to lessen any discomfort you are feeling: Remain active. It is stressful on your back to sit or stand in one place for long periods of time. Do not sit, drive, or stand in one place for more than 30 minutes at a time. Take short walks on even surfaces as soon as you are able.Try to increase the length of time you walk each day. Exercise regularly as directed by your health care provider. Exercise helps your back heal faster. It also helps avoid future injury by keeping your muscles strong and flexible. Do not stay in bed.Resting more than 1-2 days  can delay your recovery. Pay attention to your body when you bend and lift. The most comfortable positions are those that put less stress on your recovering back. Always use proper lifting techniques, including: Bending your knees. Keeping the load close to your body. Avoiding twisting. Find a comfortable position to sleep. Use a firm mattress and lie on your side with your knees slightly bent. If you lie on your back, put a pillow under your knees. Avoid feeling anxious or stressed.Stress increases muscle tension and can worsen back pain.It is important to recognize when you are anxious or stressed and learn  ways to manage it, such as with exercise. Take medicines only as directed by your health care provider. Over-the-counter medicines to reduce pain and inflammation are often the most helpful.Your health care provider may prescribe muscle relaxant drugs.These medicines help dull your pain so you can more quickly return to your normal activities and healthy exercise. Apply ice to the injured area: Put ice in a plastic bag. Place a towel between your skin and the bag. Leave the ice on for 20 minutes, 2-3 times a day for the first 2-3 days. After that, ice and heat may be alternated to reduce pain and spasms. Maintain a healthy weight. Excess weight puts extra stress on your back and makes it difficult to maintain good posture. Contact a health care provider if: You have pain that is not relieved with rest or medicine. You have increasing pain going down into the legs or buttocks. You have pain that does not improve in one week. You have night pain. You lose weight. You have a fever or chills. Get help right away if: You develop new bowel or bladder control problems. You have unusual weakness or numbness in your arms or legs. You develop nausea or vomiting. You develop abdominal pain. You feel faint. This information is not intended to replace advice given to you by your health care provider. Make sure you discuss any questions you have with your health care provider. Document Released: 10/26/2005 Document Revised: 03/05/2016 Document Reviewed: 02/27/2014 Elsevier Interactive Patient Education  2017 [employer].

## 2017-03-22 NOTE — Progress Notes (Signed)
Subjective:  I acted as a Education administrator for Dr. Charlett Blake. [place], [place]   [name], female    DOB: [birth year], 61 y.o.   MRN: [id number]  Chief Complaint  Patient presents with  . Follow-up  . Hypertension    HPI  Patient is in today for a follow up following up on HTN and other medical concerns. She has just moved back to [place] from [place] and is moving into an home. She is endorsing increased allergies and congestion since moving back. She had a dermatologic allergic reaction and required a trip to the emergency room. After treatment in the ER her rash has resolved. She is not taking any daily antihistamines. She does also note some ongoing fatigue status post her cardiac surgery she had back in February. It is slowly improving. Her new defibrillator, pacer has been working well otherwise. No recent febrile illness or further hospitalizations. She does endorse worsening low back pain but acknowledges it may have some needed with her move. No radicular symptoms or incontinence. No falls or trauma. Denies CP/palp/SOB/HA/fevers/GI or GU c/o. Taking meds as prescribed  Patient Care Team: Mosie Lukes, MD as PCP - General (Family Medicine)   Past Medical History:  Diagnosis Date  . Abnormal SPEP 07/26/2016  . Arthritis    knees, hands  . Back pain 07/14/2016  . Bowel obstruction ([facility]) 11/2014  . CHF (congestive heart failure) ([facility])   . Congestive heart failure ([facility]) 02/24/2015   S/p pacemaker  . Depression   . Epigastric pain 03/22/2017  . GERD (gastroesophageal reflux disease)   . Hyperlipidemia   . Hypertension   . Low back pain 07/14/2016  . 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 Myalgia 12/31/2016  . Obstructive sleep apnea 03/31/2015  . SOB (shortness of breath) 04/09/2016  . Stroke [facility])    TIAs    Past Surgical History:  Procedure Laterality Date  . ABDOMINAL HYSTERECTOMY     menorraghia, 2006, total  . CHOLECYSTECTOMY    .  KNEE SURGERY     right, repair torn torn cartialage  . PACEMAKER INSERTION  08/2014  . TONSILLECTOMY    . TUBAL LIGATION      Family History  Problem Relation Age of Onset  . Diabetes Mother   . Hypertension Mother   . Heart disease Mother        s/p 1 stent  . Hyperlipidemia Mother   . Arthritis Mother   . Cancer Father        COLON  . Colon cancer Father 67  . Irritable bowel syndrome Sister   . Hyperlipidemia Daughter   . Hypertension Daughter   . Hypertension Maternal Grandmother   . Arthritis Maternal Grandmother   . Heart disease Maternal Grandfather        MI  . Hypertension Maternal Grandfather   . Arthritis Maternal Grandfather   . Hypertension Son     Social History   Social History  . Marital status: Single    Spouse name: N/A  . Number of children: N/A  . Years of education: N/A   Occupational History  . [name].   Social History Main Topics  . Smoking status: Never Smoker  . Smokeless tobacco: Never Used  . Alcohol use No  . Drug use: No  . Sexual activity: No     Comment: Lives alone, no dietary restrictions   Other Topics Concern  . [name]   Social  History Narrative  . No narrative on file    Outpatient Medications Prior to Visit  Medication Sig Dispense Refill  . albuterol (PROVENTIL HFA;VENTOLIN HFA) 108 (90 Base) MCG/ACT inhaler Inhale 2 puffs into the lungs every 6 (six) hours as needed for wheezing or shortness of breath. 1 Inhaler 1  . alendronate (FOSAMAX) 70 MG tablet Take 1 tablet (70 mg total) by mouth once a week. Take with a full glass of water on an empty stomach. 12 tablet 3  . amLODipine (NORVASC) 5 MG tablet Take 1 tablet (5 mg total) by mouth daily. 90 tablet 2  . amLODipine (NORVASC) 5 MG tablet Take 1 tablet (5 mg total) by mouth daily. 90 tablet 3  . baclofen (LIORESAL) 20 MG tablet Take 1 tablet (20 mg total) by mouth 3 (three) times daily as needed for muscle spasms. 90 each 2  . carvedilol (COREG) 25 MG  tablet Take 1 tablet (25 mg total) by mouth 2 (two) times daily with a meal. 180 tablet 1  . Cholecalciferol (VITAMIN D3) 5000 UNITS CAPS Take 1 capsule by mouth daily.    Marland Kitchen docusate sodium (COLACE) 50 MG capsule Take 50 mg by mouth 2 (two) times daily.    . ergocalciferol (VITAMIN D2) 50000 units capsule Take 1 capsule (50,000 Units total) by mouth once a week. 12 capsule 1  . escitalopram (LEXAPRO) 20 MG tablet Take 1 tablet (20 mg total) by mouth daily. 90 tablet 1  . fluticasone (FLONASE) 50 MCG/ACT nasal spray Place 2 sprays into both nostrils daily. (Patient taking differently: Place 2 sprays into both nostrils as needed. ) 48 g 1  . furosemide (LASIX) 20 MG tablet Take 1 tablet (20 mg total) by mouth daily. (Patient taking differently: Take 20 mg by mouth daily as needed. ) 90 tablet 1  . gabapentin (NEURONTIN) 300 MG capsule Take 1 capsule (300 mg total) by mouth 3 (three) times daily. 90 capsule 3  . hyoscyamine (LEVSIN SL) 0.125 MG SL tablet Place 1 tablet (0.125 mg total) under the tongue every 4 (four) hours as needed. 90 tablet 01  . ibuprofen (ADVIL,MOTRIN) 600 MG tablet Take 1 tablet (600 mg total) by mouth 2 (two) times daily with a meal. 60 tablet 3  . meclizine (ANTIVERT) 25 MG tablet Take 1 tablet (25 mg total) by mouth 3 (three) times daily as needed for dizziness. 30 tablet 0  . modafinil (PROVIGIL) 100 MG tablet Take 1 tablet (100 mg total) by mouth daily. 30 tablet 5  . Multiple Vitamins-Minerals (CENTRUM SILVER ADULT 50+ PO) Take by mouth.    . naproxen (NAPROSYN) 500 MG tablet Take 1 tablet (500 mg total) by mouth 2 (two) times daily with a meal. 30 tablet 0  . ondansetron (ZOFRAN) 4 MG tablet Take 1 tablet (4 mg total) by mouth every 8 (eight) hours as needed for nausea or vomiting. 90 tablet 1  . pantoprazole (PROTONIX) 40 MG tablet Take 1 tablet (40 mg total) by mouth 2 (two) times daily. 180 tablet 1  . potassium chloride SA (K-DUR,KLOR-CON) 20 MEQ tablet Take 1 tablet  (20 mEq total) by mouth daily. 90 tablet 1  . promethazine (PHENERGAN) 25 MG tablet Take 1 tablet (25 mg total) by mouth every 6 (six) hours as needed for nausea or vomiting. 60 tablet 5  . silver sulfADIAZINE (SILVADENE) 1 % cream Apply 1 application topically daily. 50 g 1  . sucralfate (CARAFATE) 1 G tablet Take 1 tablet (1 g total)  by mouth 2 (two) times daily. 180 tablet 1  . triamterene-hydrochlorothiazide (MAXZIDE-25) 37.5-25 MG tablet Take 1 tablet by mouth daily. 90 tablet 1  . zolpidem (AMBIEN) 10 MG tablet Take 0.5-1 tablets (5-10 mg total) by mouth at bedtime as needed for sleep. 30 tablet 1   No facility-administered medications prior to visit.     Allergies  Allergen Reactions  . Benazepril Anaphylaxis    angioedema  . Codeine Nausea And Vomiting    Review of Systems  Constitutional: Positive for malaise/fatigue. Negative for fever.  HENT: Negative for congestion.   Eyes: Negative for blurred vision.  Respiratory: Negative for cough and shortness of breath.   Cardiovascular: Negative for chest pain, palpitations and leg swelling.  Gastrointestinal: Positive for abdominal pain, nausea and vomiting.  Musculoskeletal: Negative for back pain.  Skin: Negative for rash.  Neurological: Negative for loss of consciousness and headaches.       Objective:    Physical Exam  Constitutional: She is oriented to person, place, and time. She appears well-developed and well-nourished. No distress.  HENT:  Head: Normocephalic and atraumatic.  Eyes: Conjunctivae are normal.  Neck: Normal range of motion. No thyromegaly present.  Cardiovascular: Normal rate and regular rhythm.   Pulmonary/Chest: Effort normal and breath sounds normal. She has no wheezes.  Abdominal: Soft. Bowel sounds are normal. There is no tenderness.  Musculoskeletal: Normal range of motion. She exhibits no edema or deformity.  Neurological: She is alert and oriented to person, place, and time.  Skin: Skin is  warm and dry. She is not diaphoretic.  Psychiatric: She has a normal mood and affect.    BP 110/72 (BP Location: Left Arm, Patient Position: Sitting, Cuff Size: Normal)   Pulse 80   Temp 97.9 F (36.6 C) (Oral)   Resp 18   Wt 179 lb 9.6 oz (81.5 kg)   SpO2 97%   BMI 28.99 kg/m  Wt Readings from Last 3 Encounters:  03/22/17 179 lb 9.6 oz (81.5 kg)  12/31/16 183 lb 1.9 oz (83.1 kg)  09/16/16 184 lb 12.8 oz (83.8 kg)   BP Readings from Last 3 Encounters:  03/22/17 110/72  12/31/16 (!) 156/102  09/18/16 120/80     Immunization History  Administered Date(s) Administered  . Influenza,inj,Quad PF,36+ Mos 07/14/2016  . Influenza-Unspecified 08/23/2014, 07/06/2015  . Pneumococcal-Unspecified 07/06/2015  . Tdap 10/11/2015  . Zoster 10/11/2015, 09/15/2016    Health Maintenance  Topic Date Due  . INFLUENZA VACCINE  06/09/2017  . MAMMOGRAM  12/23/2017  . PAP SMEAR  10/10/2018  . COLONOSCOPY  10/26/2019  . TETANUS/TDAP  10/10/2025  . Hepatitis C Screening  Completed  . HIV Screening  Completed    Lab Results  Component Value Date   WBC 3.6 (L) 12/31/2016   HGB 12.2 12/31/2016   HCT 37.2 12/31/2016   PLT 103 (L) 12/31/2016   GLUCOSE 94 12/31/2016   CHOL 163 12/31/2016   TRIG 65.0 12/31/2016   HDL 48.00 12/31/2016   LDLCALC 102 (H) 12/31/2016   ALT 16 12/31/2016   AST 28 12/31/2016   NA 140 12/31/2016   K 3.6 12/31/2016   CL 107 12/31/2016   CREATININE 0.8 12/31/2016   BUN 22.3 12/31/2016   CO2 24 12/31/2016   TSH 1.34 12/31/2016    Lab Results  Component Value Date   TSH 1.34 12/31/2016   Lab Results  Component Value Date   WBC 3.6 (L) 12/31/2016   HGB 12.2 12/31/2016   HCT 37.2  12/31/2016   MCV 89 12/31/2016   PLT 103 (L) 12/31/2016   Lab Results  Component Value Date   NA 140 12/31/2016   K 3.6 12/31/2016   CHLORIDE 108 12/31/2016   CO2 24 12/31/2016   GLUCOSE 94 12/31/2016   BUN 22.3 12/31/2016   CREATININE 0.8 12/31/2016   BILITOT 0.46  12/31/2016   ALKPHOS 51 12/31/2016   AST 28 12/31/2016   ALT 16 12/31/2016   PROT 8.7 (H) 12/31/2016   PROT 9.5 (H) 12/31/2016   ALBUMIN 3.7 12/31/2016   CALCIUM 9.5 12/31/2016   ANIONGAP 9 12/31/2016   EGFR >90 12/31/2016   GFR 102.08 12/31/2016   Lab Results  Component Value Date   CHOL 163 12/31/2016   Lab Results  Component Value Date   HDL 48.00 12/31/2016   Lab Results  Component Value Date   LDLCALC 102 (H) 12/31/2016   Lab Results  Component Value Date   TRIG 65.0 12/31/2016   Lab Results  Component Value Date   CHOLHDL 3 12/31/2016   No results found for: HGBA1C       Assessment & Plan:   Problem List Items Addressed This Visit    Monoclonal gammopathy of unknown significance (MGUS) (Chronic)    Follows with oncology has appt in a couple of months.      Hyperlipidemia    Encouraged heart healthy diet, increase exercise, avoid trans fats      Hypertension    Well controlled, no changes to meds. Encouraged heart healthy diet such as the DASH diet and exercise as tolerated.       Obstructive sleep apnea    Using CPAP machine.gets supplies from [facility] and sees [facility]. Last sleep study was several years ago.       Low back pain    Has worsening low back pain most days. No radicular symptoms. No incontinence. Try Lidocaine patch or gels prn. May try Tizanidine qhs prn and refer to sports med for further consideration      Relevant Medications   tiZANidine (ZANAFLEX) 4 MG tablet   Other Relevant Orders   Ambulatory referral to Sports Medicine   Epigastric pain    Notes occurs after eating and can get bad enough that she has to vomit then she feels better. Ice and ginger ale are helpful. Has been years since endoscopy per patient requested records and referred to [facility] for further evaluation. Eat small meals and minimize fats. Get Mylanta and use when symptoms occur      Relevant Orders   Ambulatory referral to Gastroenterology       I am having Ms. Engen start on tiZANidine. I am also having her maintain her docusate sodium, Vitamin D3, Multiple Vitamins-Minerals (CENTRUM SILVER ADULT 50+ PO), furosemide, escitalopram, amLODipine, fluticasone, hyoscyamine, modafinil, ondansetron, promethazine, sucralfate, ibuprofen, naproxen, zolpidem, pantoprazole, alendronate, ergocalciferol, potassium chloride SA, meclizine, albuterol, silver sulfADIAZINE, baclofen, gabapentin, triamterene-hydrochlorothiazide, carvedilol, and amLODipine.  Meds ordered this encounter  Medications  . tiZANidine (ZANAFLEX) 4 MG tablet    Sig: Take 0.5-1 tablets (2-4 mg total) by mouth at bedtime as needed for muscle spasms.    Dispense:  30 tablet    Refill:  1    CMA served as scribe during this visit. History, Physical and Plan performed by medical provider. Documentation and orders reviewed and attested to.  Penni Homans, MD

## 2017-03-22 NOTE — Assessment & Plan Note (Signed)
Well controlled, no changes to meds. Encouraged heart healthy diet such as the DASH diet and exercise as tolerated.  °

## 2017-03-22 NOTE — Assessment & Plan Note (Signed)
Using CPAP machine.gets supplies from [facility] and sees [facility]. Last sleep study was several years ago.

## 2017-03-22 NOTE — Assessment & Plan Note (Signed)
Follows with oncology has appt in a couple of months.

## 2017-03-22 NOTE — Assessment & Plan Note (Signed)
Has worsening low back pain most days. No radicular symptoms. No incontinence. Try Lidocaine patch or gels prn. May try Tizanidine qhs prn and refer to sports med for further consideration

## 2017-03-22 NOTE — Assessment & Plan Note (Signed)
Notes occurs after eating and can get bad enough that she has to vomit then she feels better. Ice and ginger ale are helpful. Has been years since endoscopy per patient requested records and referred to [facility] for further evaluation. Eat small meals and minimize fats. Get Mylanta and use when symptoms occur

## 2017-03-22 NOTE — Assessment & Plan Note (Signed)
Encouraged heart healthy diet, increase exercise, avoid trans fats 

## 2017-03-29 ENCOUNTER — Ambulatory Visit: Payer: Medicaid Other | Admitting: Family Medicine

## 2017-04-12 ENCOUNTER — Ambulatory Visit (HOSPITAL_BASED_OUTPATIENT_CLINIC_OR_DEPARTMENT_OTHER): Payer: Medicaid Other | Admitting: Hematology & Oncology

## 2017-04-12 ENCOUNTER — Other Ambulatory Visit (HOSPITAL_BASED_OUTPATIENT_CLINIC_OR_DEPARTMENT_OTHER): Payer: Medicaid Other

## 2017-04-12 VITALS — BP 150/88 | HR 66 | Temp 97.8°F | Resp 18 | Wt 180.8 lb

## 2017-04-12 DIAGNOSIS — D472 Monoclonal gammopathy: Secondary | ICD-10-CM

## 2017-04-12 LAB — CBC WITH DIFFERENTIAL (CANCER CENTER ONLY)
BASO#: 0 10*3/uL (ref 0.0–0.2)
BASO%: 0.4 % (ref 0.0–2.0)
EOS ABS: 0.1 10*3/uL (ref 0.0–0.5)
EOS%: 3.1 % (ref 0.0–7.0)
HCT: 36.8 % (ref 34.8–46.6)
HGB: 12.1 g/dL (ref 11.6–15.9)
LYMPH#: 1.1 10*3/uL (ref 0.9–3.3)
LYMPH%: 40.5 % (ref 14.0–48.0)
MCH: 29.3 pg (ref 26.0–34.0)
MCHC: 32.9 g/dL (ref 32.0–36.0)
MCV: 89 fL (ref 81–101)
MONO#: 0.2 10*3/uL (ref 0.1–0.9)
MONO%: 6.5 % (ref 0.0–13.0)
NEUT#: 1.3 10*3/uL — ABNORMAL LOW (ref 1.5–6.5)
NEUT%: 49.5 % (ref 39.6–80.0)
RBC: 4.13 10*6/uL (ref 3.70–5.32)
RDW: 13.3 % (ref 11.1–15.7)
WBC: 2.6 10*3/uL — AB (ref 3.9–10.0)

## 2017-04-12 LAB — CMP (CANCER CENTER ONLY)
ALT(SGPT): 26 U/L (ref 10–47)
AST: 40 U/L — ABNORMAL HIGH (ref 11–38)
Albumin: 3.3 g/dL (ref 3.3–5.5)
Alkaline Phosphatase: 46 U/L (ref 26–84)
BUN: 19 mg/dL (ref 7–22)
CHLORIDE: 105 meq/L (ref 98–108)
CO2: 29 meq/L (ref 18–33)
CREATININE: 0.7 mg/dL (ref 0.6–1.2)
Calcium: 9.6 mg/dL (ref 8.0–10.3)
GLUCOSE: 97 mg/dL (ref 73–118)
Potassium: 3.4 mEq/L (ref 3.3–4.7)
SODIUM: 140 meq/L (ref 128–145)
TOTAL PROTEIN: 9.6 g/dL — AB (ref 6.4–8.1)
Total Bilirubin: 0.6 mg/dl (ref 0.20–1.60)

## 2017-04-12 NOTE — Progress Notes (Signed)
Hematology and Oncology Follow Up Visit  Tami [name] [id number] 03-12-1956 61 y.o. 04/12/2017   Principle Diagnosis:   IgG Kappa MGUS  Current Therapy:    Observation     Interim History:  Tami [name] is back for follow-up.h she looks good. She had a hard time with the pacemaker exchange. Apparently, there is some wires that cannot be placed so she needed to have a second incision on the chest wall.  She now has moved back to [place]. She was in [place].  We last saw her, her monoclonal studies were up a little bit. Her M spike was 2.2 g/dL. Her IgG level was 3045 mg/dL. Her Kappa Lightchain was 5.6 mg/dL.  She feels well. She's had no problem with fatigue or weakness. She's had no cough or shortness of breath. She's had no change in bowel or bladder habits. She's had no leg swelling.  Overall, her performance status is ECOG 1   Medications:  Current Outpatient Prescriptions:  .  albuterol (PROVENTIL HFA;VENTOLIN HFA) 108 (90 Base) MCG/ACT inhaler, Inhale 2 puffs into the lungs every 6 (six) hours as needed for wheezing or shortness of breath., Disp: 1 Inhaler, Rfl: 1 .  alendronate (FOSAMAX) 70 MG tablet, Take 1 tablet (70 mg total) by mouth once a week. Take with a full glass of water on an empty stomach., Disp: 12 tablet, Rfl: 3 .  amLODipine (NORVASC) 5 MG tablet, Take 1 tablet (5 mg total) by mouth daily., Disp: 90 tablet, Rfl: 2 .  amLODipine (NORVASC) 5 MG tablet, Take 1 tablet (5 mg total) by mouth daily., Disp: 90 tablet, Rfl: 3 .  baclofen (LIORESAL) 20 MG tablet, Take 1 tablet (20 mg total) by mouth 3 (three) times daily as needed for muscle spasms., Disp: 90 each, Rfl: 2 .  carvedilol (COREG) 25 MG tablet, Take 1 tablet (25 mg total) by mouth 2 (two) times daily with a meal., Disp: 180 tablet, Rfl: 1 .  Cholecalciferol (VITAMIN D3) 5000 UNITS CAPS, Take 1 capsule by mouth daily., Disp: , Rfl:  .  docusate sodium (COLACE) 50 MG capsule, Take 50 mg by mouth 2 (two)  times daily., Disp: , Rfl:  .  ergocalciferol (VITAMIN D2) 50000 units capsule, Take 1 capsule (50,000 Units total) by mouth once a week., Disp: 12 capsule, Rfl: 1 .  escitalopram (LEXAPRO) 20 MG tablet, Take 1 tablet (20 mg total) by mouth daily., Disp: 90 tablet, Rfl: 1 .  fluticasone (FLONASE) 50 MCG/ACT nasal spray, Place 2 sprays into both nostrils daily. (Patient taking differently: Place 2 sprays into both nostrils as needed. ), Disp: 48 g, Rfl: 1 .  furosemide (LASIX) 20 MG tablet, Take 1 tablet (20 mg total) by mouth daily. (Patient taking differently: Take 20 mg by mouth daily as needed. ), Disp: 90 tablet, Rfl: 1 .  gabapentin (NEURONTIN) 300 MG capsule, Take 1 capsule (300 mg total) by mouth 3 (three) times daily., Disp: 90 capsule, Rfl: 3 .  hyoscyamine (LEVSIN SL) 0.125 MG SL tablet, Place 1 tablet (0.125 mg total) under the tongue every 4 (four) hours as needed., Disp: 90 tablet, Rfl: 01 .  ibuprofen (ADVIL,MOTRIN) 600 MG tablet, Take 1 tablet (600 mg total) by mouth 2 (two) times daily with a meal., Disp: 60 tablet, Rfl: 3 .  meclizine (ANTIVERT) 25 MG tablet, Take 1 tablet (25 mg total) by mouth 3 (three) times daily as needed for dizziness., Disp: 30 tablet, Rfl: 0 .  modafinil (PROVIGIL) 100 MG  tablet, Take 1 tablet (100 mg total) by mouth daily., Disp: 30 tablet, Rfl: 5 .  Multiple Vitamins-Minerals (CENTRUM SILVER ADULT 50+ PO), Take by mouth., Disp: , Rfl:  .  naproxen (NAPROSYN) 500 MG tablet, Take 1 tablet (500 mg total) by mouth 2 (two) times daily with a meal., Disp: 30 tablet, Rfl: 0 .  ondansetron (ZOFRAN) 4 MG tablet, Take 1 tablet (4 mg total) by mouth every 8 (eight) hours as needed for nausea or vomiting., Disp: 90 tablet, Rfl: 1 .  pantoprazole (PROTONIX) 40 MG tablet, Take 1 tablet (40 mg total) by mouth 2 (two) times daily., Disp: 180 tablet, Rfl: 1 .  potassium chloride SA (K-DUR,KLOR-CON) 20 MEQ tablet, Take 1 tablet (20 mEq total) by mouth daily., Disp: 90 tablet,  Rfl: 1 .  promethazine (PHENERGAN) 25 MG tablet, Take 1 tablet (25 mg total) by mouth every 6 (six) hours as needed for nausea or vomiting., Disp: 60 tablet, Rfl: 5 .  silver sulfADIAZINE (SILVADENE) 1 % cream, Apply 1 application topically daily., Disp: 50 g, Rfl: 1 .  sucralfate (CARAFATE) 1 G tablet, Take 1 tablet (1 g total) by mouth 2 (two) times daily., Disp: 180 tablet, Rfl: 1 .  tiZANidine (ZANAFLEX) 4 MG tablet, Take 0.5-1 tablets (2-4 mg total) by mouth at bedtime as needed for muscle spasms., Disp: 30 tablet, Rfl: 1 .  triamterene-hydrochlorothiazide (MAXZIDE-25) 37.5-25 MG tablet, Take 1 tablet by mouth daily., Disp: 90 tablet, Rfl: 1 .  zolpidem (AMBIEN) 10 MG tablet, Take 0.5-1 tablets (5-10 mg total) by mouth at bedtime as needed for sleep., Disp: 30 tablet, Rfl: 1  Allergies:  Allergies  Allergen Reactions  . Benazepril Anaphylaxis    angioedema  . Codeine Nausea And Vomiting    Past Medical History, Surgical history, Social history, and Family History were reviewed and updated.  Review of Systems:  As above   Physical Exam:  weight is 180 lb 12 oz (82 kg). Her oral temperature is 97.8 F (36.6 C). Her blood pressure is 150/88 (abnormal) and her pulse is 66. Her respiration is 18 and oxygen saturation is 100%.   Wt Readings from Last 3 Encounters:  04/12/17 180 lb 12 oz (82 kg)  03/22/17 179 lb 9.6 oz (81.5 kg)  12/31/16 183 lb 1.9 oz (83.1 kg)      Well-developed and well-nourished African-American female in no obvious distress. Head exam shows no ocular or oral lesions. There are no palpable cervical or supraclavicular lymph nodes. Lungs are clear. Cardiac exam regular rate and rhythm with no murmurs, rubs or bruits. Abdomen is soft. She has good bowel sounds. There may be some tenderness to palpation in the epigastric area. She has no palpable liver or spleen tip. Back exam shows no tenderness over the spine, ribs or hips. Extremity shows some trace edema in her  lower legs. Neurological exam shows no focal neurological deficits. Skin exam shows no rashes, ecchymoses or petechia.   Lab Results  Component Value Date   WBC 2.6 (L) 04/12/2017   HGB 12.1 04/12/2017   HCT 36.8 04/12/2017   MCV 89 04/12/2017   PLT 129 Platelet count consistent in citrate (L) 04/12/2017     Chemistry      Component Value Date/Time   NA 140 04/12/2017 0742   NA 140 12/31/2016 1000   K 3.4 04/12/2017 0742   K 3.6 12/31/2016 1000   CL 105 04/12/2017 0742   CO2 29 04/12/2017 0742   CO2 24 12/31/2016  1000   BUN 19 04/12/2017 0742   BUN 22.3 12/31/2016 1000   CREATININE 0.7 04/12/2017 0742   CREATININE 0.8 12/31/2016 1000      Component Value Date/Time   CALCIUM 9.6 04/12/2017 0742   CALCIUM 9.5 12/31/2016 1000   ALKPHOS 46 04/12/2017 0742   ALKPHOS 51 12/31/2016 1000   AST 40 (H) 04/12/2017 0742   AST 28 12/31/2016 1000   ALT 26 04/12/2017 0742   ALT 16 12/31/2016 1000   BILITOT 0.60 04/12/2017 0742   BILITOT 0.46 12/31/2016 1000         Impression and Plan: Ms. Altemose is a 61 year old African-American female. She has a monoclonal gammopathy. I would say this is an IgG kappa MGUS. I do not see anything that looks like progression to myeloma.However, we will have to see what her monoclonal studies show today.   I'll plan to get her back in 3 more months.   [name], MD 6/4/20188:37 AM

## 2017-04-13 LAB — IGG, IGA, IGM
IgA, Qn, Serum: 303 mg/dL (ref 87–352)
IgG, Qn, Serum: 3210 mg/dL — ABNORMAL HIGH (ref 700–1600)
IgM, Qn, Serum: 167 mg/dL (ref 26–217)

## 2017-04-13 LAB — KAPPA/LAMBDA LIGHT CHAINS
IG KAPPA FREE LIGHT CHAIN: 87.1 mg/L — AB (ref 3.3–19.4)
Ig Lambda Free Light Chain: 10.8 mg/L (ref 5.7–26.3)
Kappa/Lambda FluidC Ratio: 8.06 — ABNORMAL HIGH (ref 0.26–1.65)

## 2017-04-15 LAB — PROTEIN ELECTROPHORESIS, SERUM, WITH REFLEX
A/G Ratio: 0.6 — ABNORMAL LOW (ref 0.7–1.7)
ALPHA 1: 0.3 g/dL (ref 0.0–0.4)
ALPHA 2: 0.9 g/dL (ref 0.4–1.0)
Albumin: 3.1 g/dL (ref 2.9–4.4)
BETA: 1.2 g/dL (ref 0.7–1.3)
GAMMA GLOBULIN: 3 g/dL — AB (ref 0.4–1.8)
GLOBULIN, TOTAL: 5.4 g/dL — AB (ref 2.2–3.9)
INTERPRETATION(SEE BELOW): 0
M-SPIKE, %: 2.1 g/dL — AB
Total Protein: 8.5 g/dL (ref 6.0–8.5)

## 2017-04-26 ENCOUNTER — Encounter: Payer: Self-pay | Admitting: Family Medicine

## 2017-04-27 ENCOUNTER — Inpatient Hospital Stay: Payer: Medicaid Other | Admitting: Family Medicine

## 2017-04-30 ENCOUNTER — Other Ambulatory Visit (HOSPITAL_BASED_OUTPATIENT_CLINIC_OR_DEPARTMENT_OTHER): Payer: Medicaid Other

## 2017-04-30 ENCOUNTER — Ambulatory Visit (HOSPITAL_BASED_OUTPATIENT_CLINIC_OR_DEPARTMENT_OTHER): Payer: Medicaid Other | Admitting: Hematology & Oncology

## 2017-04-30 ENCOUNTER — Ambulatory Visit (INDEPENDENT_AMBULATORY_CARE_PROVIDER_SITE_OTHER): Payer: Medicaid Other | Admitting: Family Medicine

## 2017-04-30 VITALS — BP 164/96 | HR 81 | Temp 98.3°F | Resp 17 | Wt 177.0 lb

## 2017-04-30 DIAGNOSIS — F32A Depression, unspecified: Secondary | ICD-10-CM

## 2017-04-30 DIAGNOSIS — D472 Monoclonal gammopathy: Secondary | ICD-10-CM

## 2017-04-30 DIAGNOSIS — I429 Cardiomyopathy, unspecified: Secondary | ICD-10-CM | POA: Diagnosis not present

## 2017-04-30 DIAGNOSIS — I10 Essential (primary) hypertension: Secondary | ICD-10-CM

## 2017-04-30 DIAGNOSIS — K2100 Gastro-esophageal reflux disease with esophagitis, without bleeding: Secondary | ICD-10-CM

## 2017-04-30 DIAGNOSIS — F329 Major depressive disorder, single episode, unspecified: Secondary | ICD-10-CM

## 2017-04-30 LAB — CMP (CANCER CENTER ONLY)
ALK PHOS: 38 U/L (ref 26–84)
ALT: 26 U/L (ref 10–47)
AST: 33 U/L (ref 11–38)
Albumin: 3.1 g/dL — ABNORMAL LOW (ref 3.3–5.5)
BILIRUBIN TOTAL: 0.7 mg/dL (ref 0.20–1.60)
BUN: 18 mg/dL (ref 7–22)
CALCIUM: 9.3 mg/dL (ref 8.0–10.3)
CO2: 26 mEq/L (ref 18–33)
Chloride: 104 mEq/L (ref 98–108)
Creat: 0.7 mg/dl (ref 0.6–1.2)
GLUCOSE: 119 mg/dL — AB (ref 73–118)
Potassium: 3.4 mEq/L (ref 3.3–4.7)
Sodium: 138 mEq/L (ref 128–145)
Total Protein: 8.9 g/dL — ABNORMAL HIGH (ref 6.4–8.1)

## 2017-04-30 LAB — CBC WITH DIFFERENTIAL (CANCER CENTER ONLY)
BASO#: 0 10*3/uL (ref 0.0–0.2)
BASO%: 0.6 % (ref 0.0–2.0)
EOS%: 1.9 % (ref 0.0–7.0)
Eosinophils Absolute: 0.1 10*3/uL (ref 0.0–0.5)
HEMATOCRIT: 33.3 % — AB (ref 34.8–46.6)
HEMOGLOBIN: 11 g/dL — AB (ref 11.6–15.9)
LYMPH#: 1 10*3/uL (ref 0.9–3.3)
LYMPH%: 30.7 % (ref 14.0–48.0)
MCH: 29.5 pg (ref 26.0–34.0)
MCHC: 33 g/dL (ref 32.0–36.0)
MCV: 89 fL (ref 81–101)
MONO#: 0.3 10*3/uL (ref 0.1–0.9)
MONO%: 8.2 % (ref 0.0–13.0)
NEUT#: 1.9 10*3/uL (ref 1.5–6.5)
NEUT%: 58.6 % (ref 39.6–80.0)
Platelets: 119 10*3/uL — ABNORMAL LOW (ref 145–400)
RBC: 3.73 10*6/uL (ref 3.70–5.32)
RDW: 13.5 % (ref 11.1–15.7)
WBC: 3.2 10*3/uL — AB (ref 3.9–10.0)

## 2017-04-30 MED ORDER — POTASSIUM CHLORIDE CRYS ER 20 MEQ PO TBCR: 20.0000 meq | EXTENDED_RELEASE_TABLET | Freq: Every day | ORAL | 1 refills | Status: DC | PRN

## 2017-04-30 MED ORDER — PANTOPRAZOLE SODIUM 40 MG PO TBEC: 40.0000 mg | DELAYED_RELEASE_TABLET | Freq: Two times a day (BID) | ORAL | 1 refills | Status: DC

## 2017-04-30 MED ORDER — FUROSEMIDE 20 MG PO TABS: 20.0000 mg | ORAL_TABLET | Freq: Every day | ORAL | 1 refills | Status: DC | PRN

## 2017-04-30 MED ORDER — CARVEDILOL 25 MG PO TABS: 25.0000 mg | ORAL_TABLET | Freq: Two times a day (BID) | ORAL | 1 refills | Status: DC

## 2017-04-30 MED ORDER — AMLODIPINE BESYLATE 5 MG PO TABS: 5.0000 mg | ORAL_TABLET | Freq: Every day | ORAL | 1 refills | Status: DC

## 2017-04-30 NOTE — Progress Notes (Signed)
Hematology and Oncology Follow Up Visit  Tami [name] [id number] Mar 12, 1956 61 y.o. 04/30/2017   Principle Diagnosis:   IgG Kappa MGUS  Current Therapy:    Observation     Interim History:  Tami [name] is back for an early follow-up. She was admitted to [facility] recently. She had pneumonia. I was called by the hospitalist. She apparently had some leukopenia.  She has had leukopenia before. 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 I don't think this is an issue. The pneumonia however is a problem.  She feels well. She is off antibiotics.   We last saw her, her M spike was 2.1 g/dL. Her IgG level was 3210 milligrams per deciliter. Her KAPPA Light chain was 8.7 mg/dL. All this is relatively stable.  She's had no fever. She's had no rash. She's had no diarrhea. She has had no mouth sores.  Overall, her performance status is ECOG 1   Medications:  Current Outpatient Prescriptions:  .  conjugated estrogens (PREMARIN) vaginal cream, Place vaginally., Disp: , Rfl:  .  albuterol (PROVENTIL HFA;VENTOLIN HFA) 108 (90 Base) MCG/ACT inhaler, Inhale 2 puffs into the lungs every 6 (six) hours as needed for wheezing or shortness of breath., Disp: 1 Inhaler, Rfl: 1 .  alendronate (FOSAMAX) 70 MG tablet, Take 1 tablet (70 mg total) by mouth once a week. Take with a full glass of water on an empty stomach., Disp: 12 tablet, Rfl: 3 .  amLODipine (NORVASC) 5 MG tablet, Take 1 tablet (5 mg total) by mouth daily., Disp: 90 tablet, Rfl: 2 .  amLODipine (NORVASC) 5 MG tablet, Take 1 tablet (5 mg total) by mouth daily., Disp: 90 tablet, Rfl: 3 .  baclofen (LIORESAL) 20 MG tablet, Take 1 tablet (20 mg total) by mouth 3 (three) times daily as needed for muscle spasms., Disp: 90 each, Rfl: 2 .  carvedilol (COREG) 25 MG tablet, Take 1 tablet (25 mg total) by mouth 2 (two) times daily with a meal., Disp: 180 tablet, Rfl: 1 .  Cholecalciferol (VITAMIN D3) 5000 UNITS CAPS, Take  1 capsule by mouth daily., Disp: , Rfl:  .  docusate sodium (COLACE) 50 MG capsule, Take 50 mg by mouth 2 (two) times daily., Disp: , Rfl:  .  ergocalciferol (VITAMIN D2) 50000 units capsule, Take 1 capsule (50,000 Units total) by mouth once a week., Disp: 12 capsule, Rfl: 1 .  escitalopram (LEXAPRO) 20 MG tablet, Take 1 tablet (20 mg total) by mouth daily., Disp: 90 tablet, Rfl: 1 .  fluticasone (FLONASE) 50 MCG/ACT nasal spray, Place 2 sprays into both nostrils daily. (Patient taking differently: Place 2 sprays into both nostrils as needed. ), Disp: 48 g, Rfl: 1 .  furosemide (LASIX) 20 MG tablet, Take 1 tablet (20 mg total) by mouth daily. (Patient taking differently: Take 20 mg by mouth daily as needed. ), Disp: 90 tablet, Rfl: 1 .  gabapentin (NEURONTIN) 300 MG capsule, Take 1 capsule (300 mg total) by mouth 3 (three) times daily., Disp: 90 capsule, Rfl: 3 .  hyoscyamine (LEVSIN SL) 0.125 MG SL tablet, Place 1 tablet (0.125 mg total) under the tongue every 4 (four) hours as needed., Disp: 90 tablet, Rfl: 01 .  ibuprofen (ADVIL,MOTRIN) 600 MG tablet, Take 1 tablet (600 mg total) by mouth 2 (two) times daily with a meal., Disp: 60 tablet, Rfl: 3 .  meclizine (ANTIVERT) 25 MG tablet, Take 1 tablet (25 mg total) by mouth 3 (three) times daily as  needed for dizziness., Disp: 30 tablet, Rfl: 0 .  modafinil (PROVIGIL) 100 MG tablet, Take 1 tablet (100 mg total) by mouth daily., Disp: 30 tablet, Rfl: 5 .  Multiple Vitamins-Minerals (CENTRUM SILVER ADULT 50+ PO), Take by mouth., Disp: , Rfl:  .  naproxen (NAPROSYN) 500 MG tablet, Take 1 tablet (500 mg total) by mouth 2 (two) times daily with a meal., Disp: 30 tablet, Rfl: 0 .  ondansetron (ZOFRAN) 4 MG tablet, Take 1 tablet (4 mg total) by mouth every 8 (eight) hours as needed for nausea or vomiting., Disp: 90 tablet, Rfl: 1 .  pantoprazole (PROTONIX) 40 MG tablet, Take 1 tablet (40 mg total) by mouth 2 (two) times daily., Disp: 180 tablet, Rfl: 1 .   potassium chloride SA (K-DUR,KLOR-CON) 20 MEQ tablet, Take 1 tablet (20 mEq total) by mouth daily., Disp: 90 tablet, Rfl: 1 .  promethazine (PHENERGAN) 25 MG tablet, Take 1 tablet (25 mg total) by mouth every 6 (six) hours as needed for nausea or vomiting., Disp: 60 tablet, Rfl: 5 .  silver sulfADIAZINE (SILVADENE) 1 % cream, Apply 1 application topically daily., Disp: 50 g, Rfl: 1 .  sucralfate (CARAFATE) 1 G tablet, Take 1 tablet (1 g total) by mouth 2 (two) times daily., Disp: 180 tablet, Rfl: 1 .  tiZANidine (ZANAFLEX) 4 MG tablet, Take 0.5-1 tablets (2-4 mg total) by mouth at bedtime as needed for muscle spasms., Disp: 30 tablet, Rfl: 1 .  triamterene-hydrochlorothiazide (MAXZIDE-25) 37.5-25 MG tablet, Take 1 tablet by mouth daily., Disp: 90 tablet, Rfl: 1 .  zolpidem (AMBIEN) 10 MG tablet, Take 0.5-1 tablets (5-10 mg total) by mouth at bedtime as needed for sleep., Disp: 30 tablet, Rfl: 1  Allergies:  Allergies  Allergen Reactions  . Benazepril Anaphylaxis    angioedema  . Codeine Nausea And Vomiting    Past Medical History, Surgical history, Social history, and Family History were reviewed and updated.  Review of Systems:  As above   Physical Exam:  weight is 177 lb (80.3 kg). Her oral temperature is 98.3 F (36.8 C). Her blood pressure is 164/96 (abnormal) and her pulse is 81. Her respiration is 17 and oxygen saturation is 100%.   Wt Readings from Last 3 Encounters:  04/30/17 177 lb (80.3 kg)  04/12/17 180 lb 12 oz (82 kg)  03/22/17 179 lb 9.6 oz (81.5 kg)      Well-developed and well-nourished African-American female in no obvious distress. Head exam shows no ocular or oral lesions. There are no palpable cervical or supraclavicular lymph nodes. Lungs are clear. Cardiac exam regular rate and rhythm with no murmurs, rubs or bruits. Abdomen is soft. She has good bowel sounds. There may be some tenderness to palpation in the epigastric area. She has no palpable liver or spleen  tip. Back exam shows no tenderness over the spine, ribs or hips. Extremity shows some trace edema in her lower legs. Neurological exam shows no focal neurological deficits. Skin exam shows no rashes, ecchymoses or petechia.   Lab Results  Component Value Date   WBC 3.2 (L) 04/30/2017   HGB 11.0 (L) 04/30/2017   HCT 33.3 (L) 04/30/2017   MCV 89 04/30/2017   PLT 119 (L) 04/30/2017     Chemistry      Component Value Date/Time   NA 138 04/30/2017 0931   NA 140 12/31/2016 1000   K 3.4 04/30/2017 0931   K 3.6 12/31/2016 1000   CL 104 04/30/2017 0931   CO2 26  04/30/2017 0931   CO2 24 12/31/2016 1000   BUN 18 04/30/2017 0931   BUN 22.3 12/31/2016 1000   CREATININE 0.7 04/30/2017 0931   CREATININE 0.8 12/31/2016 1000      Component Value Date/Time   CALCIUM 9.3 04/30/2017 0931   CALCIUM 9.5 12/31/2016 1000   ALKPHOS 38 04/30/2017 0931   ALKPHOS 51 12/31/2016 1000   AST 33 04/30/2017 0931   AST 28 12/31/2016 1000   ALT 26 04/30/2017 0931   ALT 16 12/31/2016 1000   BILITOT 0.70 04/30/2017 0931   BILITOT 0.46 12/31/2016 1000         Impression and Plan: Ms. Grajeda is a 61 year old African-American female. She has a monoclonal gammopathy. I would say this is an IgG kappa MGUS.   I still am not convinced that she is transforming over to myeloma. I don't think we have to treat the MGUS.   We will have to follow her a bit more closely. Now that she is moved back to [place], we can do this.   I want to see her back in 6 weeks. I did this would be appropriate. Every find that her M spike is progressing, then I think we will have to consider some intervention.   I spent about 30 minutes with her today.   [name], MD 6/22/201810:59 AM

## 2017-04-30 NOTE — Patient Instructions (Addendum)
Encouraged good sleep hygiene such as dark, quiet room. No blue/green glowing lights such as computer screens in bedroom. No alcohol or stimulants in evening. Cut down on caffeine as able. Regular exercise is helpful but not just prior to bed time.   Melatonin 2 mg up to 10 mg at bed for sleep  Hypertension Hypertension is another name for high blood pressure. High blood pressure forces your heart to work harder to pump blood. This can cause problems over time. There are two numbers in a blood pressure reading. There is a top number (systolic) over a bottom number (diastolic). It is best to have a blood pressure below 120/80. Healthy choices can help lower your blood pressure. You may need medicine to help lower your blood pressure if:  Your blood pressure cannot be lowered with healthy choices.  Your blood pressure is higher than 130/80.  Follow these instructions at home: Eating and drinking  If directed, follow the DASH eating plan. This diet includes: ? Filling half of your plate at each meal with fruits and vegetables. ? Filling one quarter of your plate at each meal with whole grains. Whole grains include whole wheat pasta, brown rice, and whole grain bread. ? Eating or drinking low-fat dairy products, such as skim milk or low-fat yogurt. ? Filling one quarter of your plate at each meal with low-fat (lean) proteins. Low-fat proteins include fish, skinless chicken, eggs, beans, and tofu. ? Avoiding fatty meat, cured and processed meat, or chicken with skin. ? Avoiding premade or processed food.  Eat less than 1,500 mg of salt (sodium) a day.  Limit alcohol use to no more than 1 drink a day for nonpregnant women and 2 drinks a day for men. One drink equals 12 oz of beer, 5 oz of wine, or 1 oz of hard liquor. Lifestyle  Work with your doctor to stay at a healthy weight or to lose weight. Ask your doctor what the best weight is for you.  Get at least 30 minutes of exercise that  causes your heart to beat faster (aerobic exercise) most days of the week. This may include walking, swimming, or biking.  Get at least 30 minutes of exercise that strengthens your muscles (resistance exercise) at least 3 days a week. This may include lifting weights or pilates.  Do not use any products that contain nicotine or tobacco. This includes cigarettes and e-cigarettes. If you need help quitting, ask your doctor.  Check your blood pressure at home as told by your doctor.  Keep all follow-up visits as told by your doctor. This is important. Medicines  Take over-the-counter and prescription medicines only as told by your doctor. Follow directions carefully.  Do not skip doses of blood pressure medicine. The medicine does not work as well if you skip doses. Skipping doses also puts you at risk for problems.  Ask your doctor about side effects or reactions to medicines that you should watch for. Contact a doctor if:  You think you are having a reaction to the medicine you are taking.  You have headaches that keep coming back (recurring).  You feel dizzy.  You have swelling in your ankles.  You have trouble with your vision. Get help right away if:  You get a very bad headache.  You start to feel confused.  You feel weak or numb.  You feel faint.  You get very bad pain in your: ? Chest. ? Belly (abdomen).  You throw up (vomit) more than  once.  You have trouble breathing. Summary  Hypertension is another name for high blood pressure.  Making healthy choices can help lower blood pressure. If your blood pressure cannot be controlled with healthy choices, you may need to take medicine. This information is not intended to replace advice given to you by your health care provider. Make sure you discuss any questions you have with your health care provider. Document Released: 04/13/2008 Document Revised: 09/23/2016 Document Reviewed: 09/23/2016 Elsevier Interactive  Patient Education  [employer].

## 2017-05-01 LAB — IGG, IGA, IGM
IGM (IMMUNOGLOBIN M), SRM: 161 mg/dL (ref 26–217)
IgA, Qn, Serum: 292 mg/dL (ref 87–352)
IgG, Qn, Serum: 3167 mg/dL — ABNORMAL HIGH (ref 700–1600)

## 2017-05-02 NOTE — Assessment & Plan Note (Signed)
Stable on current meds. No changes to meds today

## 2017-05-02 NOTE — Progress Notes (Signed)
Subjective:    [name], female    DOB: 02/13/1956, 61 y.o.   MRN: [id number]  Chief Complaint  Patient presents with  . Hospitalization Follow-up    Pt here for f/u visit for 04/26/17 hosp visit. Pt states that she still feels a little week. Pt will also need several refills.     HPI Patient is in today for hospital follow up. She presented to hospital with atypical chest pain but has nothing since since her hospitalization. Her work up was negative for a cardiac cause and she feels well today although she does still endorse fatigue. Denies palp/SOB/HA/congestion/fevers/GI or GU c/o. Taking meds as prescribed  Past Medical History:  Diagnosis Date  . Abnormal SPEP 07/26/2016  . Arthritis    knees, hands  . Back pain 07/14/2016  . Bowel obstruction ([place]) 11/2014  . CHF (congestive heart failure) ([place])   . Congestive heart failure ([place]) 02/24/2015   S/p pacemaker  . Depression   . Epigastric pain 03/22/2017  . GERD (gastroesophageal reflux disease)   . Hyperlipidemia   . Hypertension   . Low back pain 07/14/2016  . 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 Myalgia 12/31/2016  . Obstructive sleep apnea 03/31/2015  . SOB (shortness of breath) 04/09/2016  . Stroke [facility])    TIAs    Past Surgical History:  Procedure Laterality Date  . ABDOMINAL HYSTERECTOMY     menorraghia, 2006, total  . CHOLECYSTECTOMY    . KNEE SURGERY     right, repair torn torn cartialage  . PACEMAKER INSERTION  08/2014  . TONSILLECTOMY    . TUBAL LIGATION      Family History  Problem Relation Age of Onset  . Diabetes Mother   . Hypertension Mother   . Heart disease Mother        s/p 1 stent  . Hyperlipidemia Mother   . Arthritis Mother   . Cancer Father        COLON  . Colon cancer Father 83  . Irritable bowel syndrome Sister   . Hyperlipidemia Daughter   . Hypertension Daughter   . Hypertension Maternal Grandmother   . Arthritis Maternal Grandmother   .  Heart disease Maternal Grandfather        MI  . Hypertension Maternal Grandfather   . Arthritis Maternal Grandfather   . Hypertension Son     Social History   Social History  . Marital status: Single    Spouse name: N/A  . Number of children: N/A  . Years of education: N/A   Occupational History  . [name].   Social History Main Topics  . Smoking status: Never Smoker  . Smokeless tobacco: Never Used  . Alcohol use No  . Drug use: No  . Sexual activity: No     Comment: Lives alone, no dietary restrictions   Other Topics Concern  . [name]   Social History Narrative  . No narrative on file    Outpatient Medications Prior to Visit  Medication Sig Dispense Refill  . albuterol (PROVENTIL HFA;VENTOLIN HFA) 108 (90 Base) MCG/ACT inhaler Inhale 2 puffs into the lungs every 6 (six) hours as needed for wheezing or shortness of breath. 1 Inhaler 1  . alendronate (FOSAMAX) 70 MG tablet Take 1 tablet (70 mg total) by mouth once a week. Take with a full glass of water on an empty stomach. 12 tablet 3  . baclofen (LIORESAL) 20 MG tablet  Take 1 tablet (20 mg total) by mouth 3 (three) times daily as needed for muscle spasms. 90 each 2  . Cholecalciferol (VITAMIN D3) 5000 UNITS CAPS Take 1 capsule by mouth daily.    Marland Kitchen conjugated estrogens (PREMARIN) vaginal cream Place vaginally.    . docusate sodium (COLACE) 50 MG capsule Take 50 mg by mouth 2 (two) times daily.    . ergocalciferol (VITAMIN D2) 50000 units capsule Take 1 capsule (50,000 Units total) by mouth once a week. 12 capsule 1  . escitalopram (LEXAPRO) 20 MG tablet Take 1 tablet (20 mg total) by mouth daily. 90 tablet 1  . fluticasone (FLONASE) 50 MCG/ACT nasal spray Place 2 sprays into both nostrils daily. (Patient taking differently: Place 2 sprays into both nostrils as needed. ) 48 g 1  . gabapentin (NEURONTIN) 300 MG capsule Take 1 capsule (300 mg total) by mouth 3 (three) times daily. 90 capsule 3  . hyoscyamine  (LEVSIN SL) 0.125 MG SL tablet Place 1 tablet (0.125 mg total) under the tongue every 4 (four) hours as needed. 90 tablet 01  . ibuprofen (ADVIL,MOTRIN) 600 MG tablet Take 1 tablet (600 mg total) by mouth 2 (two) times daily with a meal. 60 tablet 3  . meclizine (ANTIVERT) 25 MG tablet Take 1 tablet (25 mg total) by mouth 3 (three) times daily as needed for dizziness. 30 tablet 0  . modafinil (PROVIGIL) 100 MG tablet Take 1 tablet (100 mg total) by mouth daily. 30 tablet 5  . Multiple Vitamins-Minerals (CENTRUM SILVER ADULT 50+ PO) Take by mouth.    . naproxen (NAPROSYN) 500 MG tablet Take 1 tablet (500 mg total) by mouth 2 (two) times daily with a meal. 30 tablet 0  . ondansetron (ZOFRAN) 4 MG tablet Take 1 tablet (4 mg total) by mouth every 8 (eight) hours as needed for nausea or vomiting. 90 tablet 1  . potassium chloride SA (K-DUR,KLOR-CON) 20 MEQ tablet Take 1 tablet (20 mEq total) by mouth daily. 90 tablet 1  . promethazine (PHENERGAN) 25 MG tablet Take 1 tablet (25 mg total) by mouth every 6 (six) hours as needed for nausea or vomiting. 60 tablet 5  . silver sulfADIAZINE (SILVADENE) 1 % cream Apply 1 application topically daily. 50 g 1  . sucralfate (CARAFATE) 1 G tablet Take 1 tablet (1 g total) by mouth 2 (two) times daily. 180 tablet 1  . tiZANidine (ZANAFLEX) 4 MG tablet Take 0.5-1 tablets (2-4 mg total) by mouth at bedtime as needed for muscle spasms. 30 tablet 1  . triamterene-hydrochlorothiazide (MAXZIDE-25) 37.5-25 MG tablet Take 1 tablet by mouth daily. 90 tablet 1  . amLODipine (NORVASC) 5 MG tablet Take 1 tablet (5 mg total) by mouth daily. 90 tablet 2  . amLODipine (NORVASC) 5 MG tablet Take 1 tablet (5 mg total) by mouth daily. 90 tablet 3  . carvedilol (COREG) 25 MG tablet Take 1 tablet (25 mg total) by mouth 2 (two) times daily with a meal. 180 tablet 1  . furosemide (LASIX) 20 MG tablet Take 1 tablet (20 mg total) by mouth daily. (Patient taking differently: Take 20 mg by mouth  daily as needed. ) 90 tablet 1  . pantoprazole (PROTONIX) 40 MG tablet Take 1 tablet (40 mg total) by mouth 2 (two) times daily. 180 tablet 1  . zolpidem (AMBIEN) 10 MG tablet Take 0.5-1 tablets (5-10 mg total) by mouth at bedtime as needed for sleep. 30 tablet 1   No facility-administered medications prior to  visit.     Allergies  Allergen Reactions  . Benazepril Anaphylaxis    angioedema  . Codeine Nausea And Vomiting    Review of Systems  Constitutional: Positive for malaise/fatigue. Negative for fever.  HENT: Negative for congestion.   Eyes: Negative for blurred vision.  Respiratory: Negative for shortness of breath.   Cardiovascular: Negative for chest pain, palpitations and leg swelling.  Gastrointestinal: Positive for heartburn. Negative for abdominal pain, blood in stool and nausea.  Genitourinary: Negative for dysuria and frequency.  Musculoskeletal: Negative for falls.  Skin: Negative for rash.  Neurological: Negative for dizziness, loss of consciousness and headaches.  Endo/Heme/Allergies: Negative for environmental allergies.  Psychiatric/Behavioral: Negative for depression. The patient is not nervous/anxious.        Objective:    Physical Exam  Constitutional: She is oriented to person, place, and time. She appears well-developed and well-nourished. No distress.  HENT:  Head: Normocephalic and atraumatic.  Nose: Nose normal.  Eyes: Right eye exhibits no discharge. Left eye exhibits no discharge.  Neck: Normal range of motion. Neck supple.  Cardiovascular: Normal rate and regular rhythm.   Pulmonary/Chest: Effort normal and breath sounds normal.  Abdominal: Soft. Bowel sounds are normal. There is no tenderness.  Musculoskeletal: She exhibits no edema.  Neurological: She is alert and oriented to person, place, and time.  Skin: Skin is warm and dry.  Psychiatric: She has a normal mood and affect.  Nursing note and vitals reviewed.   BP 134/80   Pulse 68    Temp 97.7 F (36.5 C) (Oral)   Ht 5\' 6"  (1.676 m)   Wt 177 lb 12.8 oz (80.6 kg)   SpO2 100%   BMI 28.70 kg/m  Wt Readings from Last 3 Encounters:  04/30/17 177 lb 12.8 oz (80.6 kg)  04/30/17 177 lb (80.3 kg)  04/12/17 180 lb 12 oz (82 kg)     Lab Results  Component Value Date   WBC 3.2 (L) 04/30/2017   HGB 11.0 (L) 04/30/2017   HCT 33.3 (L) 04/30/2017   PLT 119 (L) 04/30/2017   GLUCOSE 119 (H) 04/30/2017   CHOL 163 12/31/2016   TRIG 65.0 12/31/2016   HDL 48.00 12/31/2016   LDLCALC 102 (H) 12/31/2016   ALT 26 04/30/2017   AST 33 04/30/2017   NA 138 04/30/2017   K 3.4 04/30/2017   CL 104 04/30/2017   CREATININE 0.7 04/30/2017   BUN 18 04/30/2017   CO2 26 04/30/2017   TSH 1.34 12/31/2016    Lab Results  Component Value Date   TSH 1.34 12/31/2016   Lab Results  Component Value Date   WBC 3.2 (L) 04/30/2017   HGB 11.0 (L) 04/30/2017   HCT 33.3 (L) 04/30/2017   MCV 89 04/30/2017   PLT 119 (L) 04/30/2017   Lab Results  Component Value Date   NA 138 04/30/2017   K 3.4 04/30/2017   CHLORIDE 108 12/31/2016   CO2 26 04/30/2017   GLUCOSE 119 (H) 04/30/2017   BUN 18 04/30/2017   CREATININE 0.7 04/30/2017   BILITOT 0.70 04/30/2017   ALKPHOS 38 04/30/2017   AST 33 04/30/2017   ALT 26 04/30/2017   PROT 8.9 (H) 04/30/2017   ALBUMIN 3.1 (L) 04/30/2017   CALCIUM 9.3 04/30/2017   ANIONGAP 9 12/31/2016   EGFR >90 12/31/2016   GFR 102.08 12/31/2016   Lab Results  Component Value Date   CHOL 163 12/31/2016   Lab Results  Component Value Date   HDL  48.00 12/31/2016   Lab Results  Component Value Date   LDLCALC 102 (H) 12/31/2016   Lab Results  Component Value Date   TRIG 65.0 12/31/2016   Lab Results  Component Value Date   CHOLHDL 3 12/31/2016   No results found for: HGBA1C     Assessment & Plan:   Problem List Items Addressed This Visit    Monoclonal gammopathy of unknown significance (MGUS) (Chronic)    Following with oncology. Recent drop in  WBC has recovered some.       GERD (gastroesophageal reflux disease)    Avoid offending foods, start probiotics. Do not eat large meals in late evening and consider raising head of bed. Continue current meds      Relevant Medications   pantoprazole (PROTONIX) 40 MG tablet   Hypertension    Improved on recheck no changes      Relevant Medications   carvedilol (COREG) 25 MG tablet   amLODipine (NORVASC) 5 MG tablet   furosemide (LASIX) 20 MG tablet   Depression    Stable on current meds. No changes to meds today      Cardiomyopathy [facility])    Had a recent episode of atypical chest pain and was hospitalized but she had a negative cardiac work up and is feeling well now.        Relevant Medications   carvedilol (COREG) 25 MG tablet   amLODipine (NORVASC) 5 MG tablet   furosemide (LASIX) 20 MG tablet      I have changed Ms. Uphoff's furosemide. I am also having her start on potassium chloride SA. Additionally, I am having her maintain her docusate sodium, Vitamin D3, Multiple Vitamins-Minerals (CENTRUM SILVER ADULT 50+ PO), escitalopram, fluticasone, hyoscyamine, modafinil, ondansetron, promethazine, sucralfate, ibuprofen, naproxen, zolpidem, alendronate, ergocalciferol, potassium chloride SA, meclizine, albuterol, silver sulfADIAZINE, baclofen, gabapentin, triamterene-hydrochlorothiazide, tiZANidine, conjugated estrogens, carvedilol, amLODipine, and pantoprazole.  Meds ordered this encounter  Medications  . carvedilol (COREG) 25 MG tablet    Sig: Take 1 tablet (25 mg total) by mouth 2 (two) times daily with a meal.    Dispense:  180 tablet    Refill:  1  . amLODipine (NORVASC) 5 MG tablet    Sig: Take 1 tablet (5 mg total) by mouth daily.    Dispense:  90 tablet    Refill:  1  . furosemide (LASIX) 20 MG tablet    Sig: Take 1 tablet (20 mg total) by mouth daily as needed (WT gain>3# in 24 HR).    Dispense:  90 tablet    Refill:  1  . pantoprazole (PROTONIX) 40 MG tablet     Sig: Take 1 tablet (40 mg total) by mouth 2 (two) times daily.    Dispense:  180 tablet    Refill:  1  . potassium chloride SA (K-DUR,KLOR-CON) 20 MEQ tablet    Sig: Take 1 tablet (20 mEq total) by mouth daily as needed (lasix use.).    Dispense:  90 tablet    Refill:  1     Penni Homans, MD

## 2017-05-02 NOTE — Assessment & Plan Note (Signed)
Avoid offending foods, start probiotics. Do not eat large meals in late evening and consider raising head of bed. Continue current meds 

## 2017-05-02 NOTE — Assessment & Plan Note (Signed)
Had a recent episode of atypical chest pain and was hospitalized but she had a negative cardiac work up and is feeling well now.

## 2017-05-02 NOTE — Assessment & Plan Note (Signed)
Improved on recheck no changes 

## 2017-05-02 NOTE — Assessment & Plan Note (Signed)
Following with oncology. Recent drop in WBC has recovered some.

## 2017-05-03 LAB — KAPPA/LAMBDA LIGHT CHAINS
IG LAMBDA FREE LIGHT CHAIN: 11.1 mg/L (ref 5.7–26.3)
Ig Kappa Free Light Chain: 73.8 mg/L — ABNORMAL HIGH (ref 3.3–19.4)
KAPPA/LAMBDA FLC RATIO: 6.65 — AB (ref 0.26–1.65)

## 2017-05-05 LAB — PROTEIN ELECTROPHORESIS, SERUM, WITH REFLEX
A/G RATIO SPE: 0.7 (ref 0.7–1.7)
ALBUMIN: 3.4 g/dL (ref 2.9–4.4)
Alpha 1: 0.2 g/dL (ref 0.0–0.4)
Alpha 2: 0.9 g/dL (ref 0.4–1.0)
BETA: 1.1 g/dL (ref 0.7–1.3)
Gamma Globulin: 3 g/dL — ABNORMAL HIGH (ref 0.4–1.8)
Globulin, Total: 5.2 g/dL — ABNORMAL HIGH (ref 2.2–3.9)
Interpretation(See Below): 0
M-Spike, %: 2.1 g/dL — ABNORMAL HIGH
TOTAL PROTEIN: 8.6 g/dL — AB (ref 6.0–8.5)

## 2017-05-07 ENCOUNTER — Inpatient Hospital Stay: Payer: Medicaid Other | Admitting: Family Medicine

## 2017-06-08 ENCOUNTER — Other Ambulatory Visit: Payer: Self-pay | Admitting: Family Medicine

## 2017-06-08 ENCOUNTER — Telehealth: Payer: Self-pay | Admitting: Family Medicine

## 2017-06-08 MED ORDER — POTASSIUM CHLORIDE CRYS ER 20 MEQ PO TBCR: 20.0000 meq | EXTENDED_RELEASE_TABLET | Freq: Every day | ORAL | 5 refills | Status: DC

## 2017-06-08 NOTE — Telephone Encounter (Signed)
rx sent to wrong pharmacy.  rx sent to correct pharmacy and patient notified

## 2017-06-08 NOTE — Telephone Encounter (Signed)
Caller name:Marion Sharpless Relationship to patient: Can be reached: Pharmacy:[facility] [address]   Reason for call:requesting refill on potassium chloride SA (K-DUR,KLOR-CON) 20 MEQ tablet

## 2017-06-11 ENCOUNTER — Ambulatory Visit (INDEPENDENT_AMBULATORY_CARE_PROVIDER_SITE_OTHER): Payer: Self-pay | Admitting: Family Medicine

## 2017-06-11 ENCOUNTER — Other Ambulatory Visit (HOSPITAL_BASED_OUTPATIENT_CLINIC_OR_DEPARTMENT_OTHER): Payer: Self-pay

## 2017-06-11 ENCOUNTER — Ambulatory Visit (HOSPITAL_BASED_OUTPATIENT_CLINIC_OR_DEPARTMENT_OTHER): Payer: Self-pay | Admitting: Hematology & Oncology

## 2017-06-11 VITALS — BP 130/90 | HR 88 | Temp 98.0°F | Resp 16 | Ht 66.0 in | Wt 175.2 lb

## 2017-06-11 VITALS — BP 154/93 | HR 91 | Temp 98.2°F | Resp 16 | Wt 174.0 lb

## 2017-06-11 DIAGNOSIS — K2100 Gastro-esophageal reflux disease with esophagitis, without bleeding: Secondary | ICD-10-CM

## 2017-06-11 DIAGNOSIS — D472 Monoclonal gammopathy: Secondary | ICD-10-CM

## 2017-06-11 DIAGNOSIS — I10 Essential (primary) hypertension: Secondary | ICD-10-CM

## 2017-06-11 DIAGNOSIS — R1013 Epigastric pain: Secondary | ICD-10-CM

## 2017-06-11 DIAGNOSIS — R109 Unspecified abdominal pain: Secondary | ICD-10-CM

## 2017-06-11 LAB — CMP (CANCER CENTER ONLY)
ALBUMIN: 3.3 g/dL (ref 3.3–5.5)
ALK PHOS: 49 U/L (ref 26–84)
ALT(SGPT): 29 U/L (ref 10–47)
AST: 34 U/L (ref 11–38)
BILIRUBIN TOTAL: 0.5 mg/dL (ref 0.20–1.60)
BUN, Bld: 14 mg/dL (ref 7–22)
CO2: 29 mEq/L (ref 18–33)
CREATININE: 1 mg/dL (ref 0.6–1.2)
Calcium: 9.3 mg/dL (ref 8.0–10.3)
Chloride: 104 mEq/L (ref 98–108)
Glucose, Bld: 102 mg/dL (ref 73–118)
Potassium: 3.4 mEq/L (ref 3.3–4.7)
SODIUM: 138 meq/L (ref 128–145)
TOTAL PROTEIN: 9.7 g/dL — AB (ref 6.4–8.1)

## 2017-06-11 LAB — CBC WITH DIFFERENTIAL (CANCER CENTER ONLY)
BASO#: 0 10*3/uL (ref 0.0–0.2)
BASO%: 0.8 % (ref 0.0–2.0)
EOS%: 3.1 % (ref 0.0–7.0)
Eosinophils Absolute: 0.1 10*3/uL (ref 0.0–0.5)
HCT: 33.7 % — ABNORMAL LOW (ref 34.8–46.6)
HGB: 11.2 g/dL — ABNORMAL LOW (ref 11.6–15.9)
LYMPH#: 0.7 10*3/uL — ABNORMAL LOW (ref 0.9–3.3)
LYMPH%: 28.9 % (ref 14.0–48.0)
MCH: 29.4 pg (ref 26.0–34.0)
MCHC: 33.2 g/dL (ref 32.0–36.0)
MCV: 89 fL (ref 81–101)
MONO#: 0.2 10*3/uL (ref 0.1–0.9)
MONO%: 6.6 % (ref 0.0–13.0)
NEUT#: 1.6 10*3/uL (ref 1.5–6.5)
NEUT%: 60.6 % (ref 39.6–80.0)
PLATELETS: 121 10*3/uL — AB (ref 145–400)
RBC: 3.81 10*6/uL (ref 3.70–5.32)
RDW: 13.4 % (ref 11.1–15.7)
WBC: 2.6 10*3/uL — AB (ref 3.9–10.0)

## 2017-06-11 LAB — LACTATE DEHYDROGENASE: LDH: 208 U/L (ref 125–245)

## 2017-06-11 MED ORDER — METOCLOPRAMIDE HCL 10 MG PO TABS: 10.0000 mg | ORAL_TABLET | Freq: Three times a day (TID) | ORAL | 4 refills | Status: DC

## 2017-06-11 MED ORDER — FAMOTIDINE 40 MG PO TABS: 40.0000 mg | ORAL_TABLET | Freq: Every day | ORAL | 1 refills | Status: DC

## 2017-06-11 MED ORDER — HYOSCYAMINE SULFATE 0.125 MG SL SUBL: 0.1250 mg | SUBLINGUAL_TABLET | SUBLINGUAL | Status: DC | PRN

## 2017-06-11 NOTE — Patient Instructions (Addendum)
Encouraged increased hydration and fiber in diet. Daily probiotics. If bowels not moving can use MOM 2 tbls po in 4 oz of warm prune juice by mouth every 2-3 days. If no results then repeat in 4 hours with  Dulcolax suppository pr, may repeat again in 4 more hours as needed. Seek care if symptoms worsen. Consider daily Miralax and/or Dulcolax if symptoms persist.  Constipation, Adult Constipation is when a person has fewer bowel movements in a week than normal, has difficulty having a bowel movement, or has stools that are dry, hard, or larger than normal. Constipation may be caused by an underlying condition. It may become worse with age if a person takes certain medicines and does not take in enough fluids. Follow these instructions at home: Eating and drinking  Eat foods that have a lot of fiber, such as fresh fruits and vegetables, whole grains, and beans.  Limit foods that are high in fat, low in fiber, or overly processed, such as french fries, hamburgers, cookies, candies, and soda.  Drink enough fluid to keep your urine clear or pale yellow. General instructions  Exercise regularly or as told by your health care provider.  Go to the restroom when you have the urge to go. Do not hold it in.  Take over-the-counter and prescription medicines only as told by your health care provider. These include any fiber supplements.  Practice pelvic floor retraining exercises, such as deep breathing while relaxing the lower abdomen and pelvic floor relaxation during bowel movements.  Watch your condition for any changes.  Keep all follow-up visits as told by your health care provider. This is important. Contact a health care provider if:  You have pain that gets worse.  You have a fever.  You do not have a bowel movement after 4 days.  You vomit.  You are not hungry.  You lose weight.  You are bleeding from the anus.  You have thin, pencil-like stools. Get help right away if:  You  have a fever and your symptoms suddenly get worse.  You leak stool or have blood in your stool.  Your abdomen is bloated.  You have severe pain in your abdomen.  You feel dizzy or you faint. This information is not intended to replace advice given to you by your health care provider. Make sure you discuss any questions you have with your health care provider. Document Released: 07/24/2004 Document Revised: 05/15/2016 Document Reviewed: 04/15/2016 Elsevier Interactive Patient Education  2017 [employer]  

## 2017-06-11 NOTE — Progress Notes (Signed)
[name], female   DOB: December 23, 1955, 61 y.o.   MRN: [id number]    Subjective:  I acted as a Education administrator for Dr. Charlett Blake.  Guerry Bruin, Coffee.   [name], female    DOB: 05/25/1956, 61 y.o.   MRN: [id number]  Chief Complaint  Patient presents with  . Hospitalization Follow-up    abdominal pain was in hospital from [place]    HPI  Patient is in today for hospital follow up.  She states she was in the hospital from Friday night til Monday evening.  She was [facility] for abdominal pain.  She is still having some abdominal pain. The pain is somewhat improved since the hospital. Her pain is primarily in the epigastrium although her upper endoscopy did not show any concerning lesions. She was seen by gastroenterology in the hospital and would like to return to see them on an outpatient basis. No fevers or chills. No current vomiting with some ongoing nausea is persistent. No fevers or chills. Denies CP/palp/SOB/HA/congestion/fevers or GU c/o. Taking meds as prescribed  Patient Care Team: Mosie Lukes, MD as PCP - General (Family Medicine)   Past Medical History:  Diagnosis Date  . Abnormal SPEP 07/26/2016  . Arthritis    knees, hands  . Back pain 07/14/2016  . Bowel obstruction ([place]) 11/2014  . CHF (congestive heart failure) ([place])   . Congestive heart failure ([place]) 02/24/2015   S/p pacemaker  . Depression   . Epigastric pain 03/22/2017  . GERD (gastroesophageal reflux disease)   . Hyperlipidemia   . Hypertension   . Low back pain 07/14/2016  . 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 Myalgia 12/31/2016  . Obstructive sleep apnea 03/31/2015  . SOB (shortness of breath) 04/09/2016  . Stroke [facility])    TIAs    Past Surgical History:  Procedure Laterality Date  . ABDOMINAL HYSTERECTOMY     menorraghia, 2006, total  . CHOLECYSTECTOMY    . KNEE SURGERY     right, repair torn torn cartialage  . PACEMAKER INSERTION  08/2014    . TONSILLECTOMY    . TUBAL LIGATION      Family History  Problem Relation Age of Onset  . Diabetes Mother   . Hypertension Mother   . Heart disease Mother        s/p 1 stent  . Hyperlipidemia Mother   . Arthritis Mother   . Cancer Father        COLON  . Colon cancer Father 42  . Irritable bowel syndrome Sister   . Hyperlipidemia Daughter   . Hypertension Daughter   . Hypertension Maternal Grandmother   . Arthritis Maternal Grandmother   . Heart disease Maternal Grandfather        MI  . Hypertension Maternal Grandfather   . Arthritis Maternal Grandfather   . Hypertension Son     Social History   Social History  . Marital status: Single    Spouse name: N/A  . Number of children: N/A  . Years of education: N/A   Occupational History  . [name].   Social History Main Topics  . Smoking status: Never Smoker  . Smokeless tobacco: Never Used  . Alcohol use No  . Drug use: No  . Sexual activity: No     Comment: Lives alone, no dietary restrictions   Other Topics Concern  . [name]   Social History Narrative  . No  narrative on file    Outpatient Medications Prior to Visit  Medication Sig Dispense Refill  . acetaminophen (TYLENOL) 325 MG tablet Take 650 mg by mouth.    Marland Kitchen albuterol (PROVENTIL HFA;VENTOLIN HFA) 108 (90 Base) MCG/ACT inhaler Inhale 2 puffs into the lungs every 6 (six) hours as needed for wheezing or shortness of breath. 1 Inhaler 1  . alendronate (FOSAMAX) 70 MG tablet Take 1 tablet (70 mg total) by mouth once a week. Take with a full glass of water on an empty stomach. 12 tablet 3  . amLODipine (NORVASC) 10 MG tablet Take 10 mg by mouth.    . baclofen (LIORESAL) 20 MG tablet Take 1 tablet (20 mg total) by mouth 3 (three) times daily as needed for muscle spasms. 90 each 2  . carvedilol (COREG) 25 MG tablet Take 1 tablet (25 mg total) by mouth 2 (two) times daily with a meal. 180 tablet 1  . Cholecalciferol (VITAMIN D3) 5000 UNITS CAPS Take 1  capsule by mouth daily.    Marland Kitchen conjugated estrogens (PREMARIN) vaginal cream Place vaginally.    . docusate sodium (COLACE) 50 MG capsule Take 50 mg by mouth 2 (two) times daily.    . ergocalciferol (VITAMIN D2) 50000 units capsule Take 1 capsule (50,000 Units total) by mouth once a week. 12 capsule 1  . escitalopram (LEXAPRO) 20 MG tablet Take 1 tablet (20 mg total) by mouth daily. 90 tablet 1  . furosemide (LASIX) 20 MG tablet Take 1 tablet (20 mg total) by mouth daily as needed (WT gain>3# in 24 HR). 90 tablet 1  . gabapentin (NEURONTIN) 300 MG capsule Take 1 capsule (300 mg total) by mouth 3 (three) times daily. 90 capsule 3  . metoCLOPramide (REGLAN) 10 MG tablet Take 1 tablet (10 mg total) by mouth 4 (four) times daily -  before meals and at bedtime. 60 tablet 4  . modafinil (PROVIGIL) 100 MG tablet Take 1 tablet (100 mg total) by mouth daily. 30 tablet 5  . Multiple Vitamins-Minerals (CENTRUM SILVER ADULT 50+ PO) Take by mouth.    . pantoprazole (PROTONIX) 40 MG tablet Take 1 tablet (40 mg total) by mouth 2 (two) times daily. 180 tablet 1  . potassium chloride SA (KLOR-CON M20) 20 MEQ tablet Take 1 tablet (20 mEq total) by mouth daily. 30 tablet 5  . silver sulfADIAZINE (SILVADENE) 1 % cream Apply 1 application topically daily. 50 g 1  . tiZANidine (ZANAFLEX) 4 MG tablet Take 0.5-1 tablets (2-4 mg total) by mouth at bedtime as needed for muscle spasms. 30 tablet 1  . triamterene-hydrochlorothiazide (MAXZIDE-25) 37.5-25 MG tablet Take 1 tablet by mouth daily. 90 tablet 1  . hyoscyamine (LEVSIN SL) 0.125 MG SL tablet Place 1 tablet (0.125 mg total) under the tongue every 4 (four) hours as needed. 90 tablet 01  . sucralfate (CARAFATE) 1 G tablet Take 1 tablet (1 g total) by mouth 2 (two) times daily. 180 tablet 1  . zolpidem (AMBIEN) 10 MG tablet Take 0.5-1 tablets (5-10 mg total) by mouth at bedtime as needed for sleep. 30 tablet 1   No facility-administered medications prior to visit.      Allergies  Allergen Reactions  . Benazepril Anaphylaxis    angioedema  . Codeine Nausea And Vomiting    Review of Systems  Constitutional: Positive for malaise/fatigue. Negative for fever.  HENT: Negative for congestion.   Eyes: Negative for blurred vision.  Respiratory: Negative for cough and shortness of breath.  Cardiovascular: Negative for chest pain, palpitations and leg swelling.  Gastrointestinal: Positive for abdominal pain, nausea and vomiting.       Epigastric pain.  Musculoskeletal: Negative for back pain.  Skin: Negative for rash.  Neurological: Negative for loss of consciousness and headaches.       Objective:    Physical Exam  Constitutional: She is oriented to person, place, and time. She appears well-developed and well-nourished. No distress.  HENT:  Head: Normocephalic and atraumatic.  Nose: Nose normal.  Eyes: Right eye exhibits no discharge. Left eye exhibits no discharge.  Neck: Normal range of motion. Neck supple.  Cardiovascular: Normal rate and regular rhythm.   No murmur heard. Pulmonary/Chest: Effort normal and breath sounds normal.  Abdominal: Soft. Bowel sounds are normal. She exhibits no mass. There is tenderness. There is no rebound and no guarding.  Musculoskeletal: She exhibits no edema.  Neurological: She is alert and oriented to person, place, and time.  Skin: Skin is warm and dry.  Psychiatric: She has a normal mood and affect.  Nursing note and vitals reviewed.   BP 130/90   Pulse 88   Temp 98 F (36.7 C) (Oral)   Resp 16   Ht _0  (1.676 m)   Wt 175 lb 3.2 oz (79.5 kg)   SpO2 94%   BMI 28.28 kg/m  Wt Readings from Last 3 Encounters:  06/11/17 175 lb 3.2 oz (79.5 kg)  06/11/17 174 lb (78.9 kg)  04/30/17 177 lb 12.8 oz (80.6 kg)   BP Readings from Last 3 Encounters:  06/11/17 130/90  06/11/17 (!) 154/93  05/02/17 134/80     Immunization History  Administered Date(s) Administered  . Influenza Whole 09/09/2009,  09/12/2013, 07/12/2015  . Influenza,inj,Quad PF,36+ Mos 07/14/2016  . Influenza-Unspecified 08/23/2014, 07/06/2015  . Pneumococcal Conjugate-13 07/12/2015  . Pneumococcal Polysaccharide-23 09/06/2014, 07/12/2015  . Pneumococcal-Unspecified 07/06/2015  . Tdap 12/01/2010, 10/11/2015  . Zoster 10/11/2015, 09/15/2016    Health Maintenance  Topic Date Due  . INFLUENZA VACCINE  06/09/2017  . MAMMOGRAM  12/23/2017  . PAP SMEAR  10/10/2018  . COLONOSCOPY  10/26/2019  . TETANUS/TDAP  10/10/2025  . Hepatitis C Screening  Completed  . HIV Screening  Completed    Lab Results  Component Value Date   WBC 2.6 (L) 06/11/2017   HGB 11.2 (L) 06/11/2017   HCT 33.7 (L) 06/11/2017   PLT 121 (L) 06/11/2017   GLUCOSE 102 06/11/2017   CHOL 163 12/31/2016   TRIG 65.0 12/31/2016   HDL 48.00 12/31/2016   LDLCALC 102 (H) 12/31/2016   ALT 29 06/11/2017   AST 34 06/11/2017   NA 138 06/11/2017   K 3.4 06/11/2017   CL 104 06/11/2017   CREATININE 1.0 06/11/2017   BUN 14 06/11/2017   CO2 29 06/11/2017   TSH 1.34 12/31/2016    Lab Results  Component Value Date   TSH 1.34 12/31/2016   Lab Results  Component Value Date   WBC 2.6 (L) 06/11/2017   HGB 11.2 (L) 06/11/2017   HCT 33.7 (L) 06/11/2017   MCV 89 06/11/2017   PLT 121 (L) 06/11/2017   Lab Results  Component Value Date   NA 138 06/11/2017   K 3.4 06/11/2017   CHLORIDE 108 12/31/2016   CO2 29 06/11/2017   GLUCOSE 102 06/11/2017   BUN 14 06/11/2017   CREATININE 1.0 06/11/2017   BILITOT 0.50 06/11/2017   ALKPHOS 49 06/11/2017   AST 34 06/11/2017   ALT 29 06/11/2017  PROT 9.7 (H) 06/11/2017   ALBUMIN 3.3 06/11/2017   CALCIUM 9.3 06/11/2017   ANIONGAP 9 12/31/2016   EGFR >90 12/31/2016   GFR 102.08 12/31/2016   Lab Results  Component Value Date   CHOL 163 12/31/2016   Lab Results  Component Value Date   HDL 48.00 12/31/2016   Lab Results  Component Value Date   LDLCALC 102 (H) 12/31/2016   Lab Results  Component  Value Date   TRIG 65.0 12/31/2016   Lab Results  Component Value Date   CHOLHDL 3 12/31/2016   No results found for: HGBA1C       Assessment & Plan:   Problem List Items Addressed This Visit    GERD (gastroesophageal reflux disease)    Continue Pantoprazole bid and add H2 blocker.       Relevant Medications   hyoscyamine (LEVSIN SL) 0.125 MG SL tablet   famotidine (PEPCID) 40 MG tablet   Hypertension    Well controlled, no changes to meds. Encouraged heart healthy diet such as the DASH diet and exercise as tolerated.       AP (abdominal pain) - Primary    She was admitted to [facility] hospital for several days with abdominal pain recently but her work up was unremarkable and she  decreased pain now. Was seen by gastroenterology in the hospital. EGD was negative but will refer to GI for further consideration.       Relevant Medications   hyoscyamine (LEVSIN SL) 0.125 MG SL tablet   Other Relevant Orders   Ambulatory referral to Gastroenterology      I have discontinued Ms. Groll's sucralfate. I am also having her start on famotidine. Additionally, I am having her maintain her docusate sodium, Vitamin D3, Multiple Vitamins-Minerals (CENTRUM SILVER ADULT 50+ PO), escitalopram, modafinil, zolpidem, alendronate, ergocalciferol, albuterol, silver sulfADIAZINE, baclofen, gabapentin, triamterene-hydrochlorothiazide, tiZANidine, conjugated estrogens, carvedilol, furosemide, pantoprazole, potassium chloride SA, acetaminophen, amLODipine, metoCLOPramide, and hyoscyamine.  Meds ordered this encounter  Medications  . hyoscyamine (LEVSIN SL) 0.125 MG SL tablet    Sig: Place 1 tablet (0.125 mg total) under the tongue every 4 (four) hours as needed.    Dispense:  90 tablet    Refill:  01  . famotidine (PEPCID) 40 MG tablet    Sig: Take 1 tablet (40 mg total) by mouth at bedtime.    Dispense:  90 tablet    Refill:  1    CMA served as Education administrator during this visit. History, Physical and Plan  performed by medical provider. Documentation and orders reviewed and attested to.  Penni Homans, MD

## 2017-06-11 NOTE — Progress Notes (Signed)
Hematology and Oncology Follow Up Visit  Tami [name] [id number] Oct 09, 1956 61 y.o. 06/11/2017   Principle Diagnosis:   IgG Kappa MGUS  Current Therapy:    Observation     Interim History:  Tami [name] is back for follow-up. She is still having some problems with her abdomen. She is having upper abdominal pain. She was over at [facility]. She had an upper endoscopy. She's had scans. She's had x-rays. So far, nothing has been found.  She is on a ton of medicines. I think that [place] is also trying to narrow down her medications.  As far as her MGUS is concerned, this has been holding pretty steady. We last saw her, her M spike was 2.1 g/dL. Her IgG level was 3167 milligrams per deciliter. Her Kappa light chain was 7.4 mg/dL. All this is holding steady.   When I reviewed the x-rays that she had done at [facility], I did not see anything that looked like lytic lesions. There were no plasmacytomas.   She is going to possibly have a colonoscopy.   I did give her a prescription for Reglan to see if this would help her digestion.   Overall, her performance status is ECOG 1   Medications:  Current Outpatient Prescriptions:  .  acetaminophen (TYLENOL) 325 MG tablet, Take 650 mg by mouth., Disp: , Rfl:  .  amLODipine (NORVASC) 10 MG tablet, Take 10 mg by mouth., Disp: , Rfl:  .  albuterol (PROVENTIL HFA;VENTOLIN HFA) 108 (90 Base) MCG/ACT inhaler, Inhale 2 puffs into the lungs every 6 (six) hours as needed for wheezing or shortness of breath., Disp: 1 Inhaler, Rfl: 1 .  alendronate (FOSAMAX) 70 MG tablet, Take 1 tablet (70 mg total) by mouth once a week. Take with a full glass of water on an empty stomach., Disp: 12 tablet, Rfl: 3 .  baclofen (LIORESAL) 20 MG tablet, Take 1 tablet (20 mg total) by mouth 3 (three) times daily as needed for muscle spasms., Disp: 90 each, Rfl: 2 .  carvedilol (COREG) 25 MG tablet, Take 1 tablet (25 mg total) by mouth 2 (two) times daily with a meal.,  Disp: 180 tablet, Rfl: 1 .  Cholecalciferol (VITAMIN D3) 5000 UNITS CAPS, Take 1 capsule by mouth daily., Disp: , Rfl:  .  conjugated estrogens (PREMARIN) vaginal cream, Place vaginally., Disp: , Rfl:  .  docusate sodium (COLACE) 50 MG capsule, Take 50 mg by mouth 2 (two) times daily., Disp: , Rfl:  .  ergocalciferol (VITAMIN D2) 50000 units capsule, Take 1 capsule (50,000 Units total) by mouth once a week., Disp: 12 capsule, Rfl: 1 .  escitalopram (LEXAPRO) 20 MG tablet, Take 1 tablet (20 mg total) by mouth daily., Disp: 90 tablet, Rfl: 1 .  fluticasone (FLONASE) 50 MCG/ACT nasal spray, Place 2 sprays into both nostrils daily. (Patient taking differently: Place 2 sprays into both nostrils as needed. ), Disp: 48 g, Rfl: 1 .  furosemide (LASIX) 20 MG tablet, Take 1 tablet (20 mg total) by mouth daily as needed (WT gain>3# in 24 HR)., Disp: 90 tablet, Rfl: 1 .  gabapentin (NEURONTIN) 300 MG capsule, Take 1 capsule (300 mg total) by mouth 3 (three) times daily., Disp: 90 capsule, Rfl: 3 .  hyoscyamine (LEVSIN SL) 0.125 MG SL tablet, Place 1 tablet (0.125 mg total) under the tongue every 4 (four) hours as needed., Disp: 90 tablet, Rfl: 01 .  ibuprofen (ADVIL,MOTRIN) 600 MG tablet, Take 1 tablet (600 mg total)  by mouth 2 (two) times daily with a meal., Disp: 60 tablet, Rfl: 3 .  meclizine (ANTIVERT) 25 MG tablet, Take 1 tablet (25 mg total) by mouth 3 (three) times daily as needed for dizziness., Disp: 30 tablet, Rfl: 0 .  modafinil (PROVIGIL) 100 MG tablet, Take 1 tablet (100 mg total) by mouth daily., Disp: 30 tablet, Rfl: 5 .  Multiple Vitamins-Minerals (CENTRUM SILVER ADULT 50+ PO), Take by mouth., Disp: , Rfl:  .  naproxen (NAPROSYN) 500 MG tablet, Take 1 tablet (500 mg total) by mouth 2 (two) times daily with a meal., Disp: 30 tablet, Rfl: 0 .  ondansetron (ZOFRAN) 4 MG tablet, Take 1 tablet (4 mg total) by mouth every 8 (eight) hours as needed for nausea or vomiting., Disp: 90 tablet, Rfl: 1 .   pantoprazole (PROTONIX) 40 MG tablet, Take 1 tablet (40 mg total) by mouth 2 (two) times daily., Disp: 180 tablet, Rfl: 1 .  potassium chloride SA (KLOR-CON M20) 20 MEQ tablet, Take 1 tablet (20 mEq total) by mouth daily., Disp: 30 tablet, Rfl: 5 .  promethazine (PHENERGAN) 25 MG tablet, Take 1 tablet (25 mg total) by mouth every 6 (six) hours as needed for nausea or vomiting., Disp: 60 tablet, Rfl: 5 .  silver sulfADIAZINE (SILVADENE) 1 % cream, Apply 1 application topically daily., Disp: 50 g, Rfl: 1 .  sucralfate (CARAFATE) 1 G tablet, Take 1 tablet (1 g total) by mouth 2 (two) times daily., Disp: 180 tablet, Rfl: 1 .  tiZANidine (ZANAFLEX) 4 MG tablet, Take 0.5-1 tablets (2-4 mg total) by mouth at bedtime as needed for muscle spasms., Disp: 30 tablet, Rfl: 1 .  triamterene-hydrochlorothiazide (MAXZIDE-25) 37.5-25 MG tablet, Take 1 tablet by mouth daily., Disp: 90 tablet, Rfl: 1 .  zolpidem (AMBIEN) 10 MG tablet, Take 0.5-1 tablets (5-10 mg total) by mouth at bedtime as needed for sleep., Disp: 30 tablet, Rfl: 1  Allergies:  Allergies  Allergen Reactions  . Benazepril Anaphylaxis    angioedema  . Codeine Nausea And Vomiting    Past Medical History, Surgical history, Social history, and Family History were reviewed and updated.  Review of Systems:  As above   Physical Exam:  weight is 174 lb (78.9 kg). Her oral temperature is 98.2 F (36.8 C). Her blood pressure is 154/93 (abnormal) and her pulse is 91. Her respiration is 16 and oxygen saturation is 97%.   Wt Readings from Last 3 Encounters:  06/11/17 174 lb (78.9 kg)  04/30/17 177 lb 12.8 oz (80.6 kg)  04/30/17 177 lb (80.3 kg)      Well-developed and well-nourished African-American female in no obvious distress. Head exam shows no ocular or oral lesions. There are no palpable cervical or supraclavicular lymph nodes. Lungs are clear. Cardiac exam regular rate and rhythm with no murmurs, rubs or bruits. Abdomen is soft. She has  good bowel sounds. There may be some tenderness to palpation in the epigastric area. She has no palpable liver or spleen tip. Back exam shows no tenderness over the spine, ribs or hips. Extremity shows some trace edema in her lower legs. Neurological exam shows no focal neurological deficits. Skin exam shows no rashes, ecchymoses or petechia.   Lab Results  Component Value Date   WBC 2.6 (L) 06/11/2017   HGB 11.2 (L) 06/11/2017   HCT 33.7 (L) 06/11/2017   MCV 89 06/11/2017   PLT 121 (L) 06/11/2017     Chemistry      Component Value Date/Time  NA 138 04/30/2017 0931   NA 140 12/31/2016 1000   K 3.4 04/30/2017 0931   K 3.6 12/31/2016 1000   CL 104 04/30/2017 0931   CO2 26 04/30/2017 0931   CO2 24 12/31/2016 1000   BUN 18 04/30/2017 0931   BUN 22.3 12/31/2016 1000   CREATININE 0.7 04/30/2017 0931   CREATININE 0.8 12/31/2016 1000      Component Value Date/Time   CALCIUM 9.3 04/30/2017 0931   CALCIUM 9.5 12/31/2016 1000   ALKPHOS 38 04/30/2017 0931   ALKPHOS 51 12/31/2016 1000   AST 33 04/30/2017 0931   AST 28 12/31/2016 1000   ALT 26 04/30/2017 0931   ALT 16 12/31/2016 1000   BILITOT 0.70 04/30/2017 0931   BILITOT 0.46 12/31/2016 1000         Impression and Plan: Ms. Gonet is a 61 year old African-American female. She has a monoclonal gammopathy. I would say this is an IgG kappa MGUS.   I still am not convinced that she is transforming over to myeloma. I don't think we have to treat the MGUS.   We will have to follow her a bit more closely.It'll be interesting to see what happens with her GI workup.   I want to see her back in 6 weeks. I did this would be appropriate.If I  find that her M spike is progressing, then I think we will have to consider some intervention.   I spent about 30 minutes with her today.   [name], MD 8/3/20188:53 AM

## 2017-06-12 LAB — IGG, IGA, IGM
IGM (IMMUNOGLOBIN M), SRM: 165 mg/dL (ref 26–217)
IgA, Qn, Serum: 306 mg/dL (ref 87–352)
IgG, Qn, Serum: 3480 mg/dL — ABNORMAL HIGH (ref 700–1600)

## 2017-06-13 NOTE — Assessment & Plan Note (Signed)
Well controlled, no changes to meds. Encouraged heart healthy diet such as the DASH diet and exercise as tolerated.  °

## 2017-06-13 NOTE — Assessment & Plan Note (Signed)
Continue Pantoprazole bid and add H2 blocker.

## 2017-06-13 NOTE — Assessment & Plan Note (Addendum)
She was admitted to [facility] hospital for several days with abdominal pain recently but her work up was unremarkable and she  decreased pain now. Was seen by gastroenterology in the hospital. EGD was negative but will refer to GI for further consideration.

## 2017-06-14 LAB — KAPPA/LAMBDA LIGHT CHAINS
IG KAPPA FREE LIGHT CHAIN: 100.4 mg/L — AB (ref 3.3–19.4)
IG LAMBDA FREE LIGHT CHAIN: 11.4 mg/L (ref 5.7–26.3)
Kappa/Lambda FluidC Ratio: 8.81 — ABNORMAL HIGH (ref 0.26–1.65)

## 2017-06-15 LAB — PROTEIN ELECTROPHORESIS, SERUM, WITH REFLEX
A/G RATIO SPE: 0.6 — AB (ref 0.7–1.7)
ALBUMIN: 3.4 g/dL (ref 2.9–4.4)
Alpha 1: 0.2 g/dL (ref 0.0–0.4)
Alpha 2: 0.9 g/dL (ref 0.4–1.0)
BETA: 1.2 g/dL (ref 0.7–1.3)
GLOBULIN, TOTAL: 5.5 g/dL — AB (ref 2.2–3.9)
Gamma Globulin: 3.2 g/dL — ABNORMAL HIGH (ref 0.4–1.8)
INTERPRETATION(SEE BELOW): 0
M-Spike, %: 2.1 g/dL — ABNORMAL HIGH
TOTAL PROTEIN: 8.9 g/dL — AB (ref 6.0–8.5)

## 2017-06-16 ENCOUNTER — Telehealth: Payer: Self-pay | Admitting: *Deleted

## 2017-06-16 NOTE — Telephone Encounter (Addendum)
Message left  ----- Message from [name], MD sent at 06/16/2017  7:00 AM EDT ----- Call - the myeloma numbers are holding steady!!  This is good!!!  pete

## 2017-06-23 ENCOUNTER — Ambulatory Visit: Payer: Self-pay | Admitting: Family Medicine

## 2017-06-25 ENCOUNTER — Ambulatory Visit: Payer: Medicaid Other | Admitting: Family Medicine

## 2017-07-09 ENCOUNTER — Encounter: Payer: Self-pay | Admitting: Family Medicine

## 2017-07-09 ENCOUNTER — Ambulatory Visit (INDEPENDENT_AMBULATORY_CARE_PROVIDER_SITE_OTHER): Payer: Self-pay | Admitting: Family Medicine

## 2017-07-09 VITALS — BP 128/88 | HR 110 | Temp 97.5°F | Ht 66.0 in | Wt 176.2 lb

## 2017-07-09 DIAGNOSIS — I10 Essential (primary) hypertension: Secondary | ICD-10-CM

## 2017-07-09 DIAGNOSIS — E785 Hyperlipidemia, unspecified: Secondary | ICD-10-CM

## 2017-07-09 DIAGNOSIS — K5900 Constipation, unspecified: Secondary | ICD-10-CM

## 2017-07-09 DIAGNOSIS — R1013 Epigastric pain: Secondary | ICD-10-CM

## 2017-07-09 DIAGNOSIS — K2100 Gastro-esophageal reflux disease with esophagitis, without bleeding: Secondary | ICD-10-CM

## 2017-07-09 DIAGNOSIS — K56609 Unspecified intestinal obstruction, unspecified as to partial versus complete obstruction: Secondary | ICD-10-CM

## 2017-07-09 LAB — CBC WITH DIFFERENTIAL/PLATELET
BASOS PCT: 0.8 % (ref 0.0–3.0)
Basophils Absolute: 0 10*3/uL (ref 0.0–0.1)
EOS PCT: 1.3 % (ref 0.0–5.0)
Eosinophils Absolute: 0 10*3/uL (ref 0.0–0.7)
HCT: 34.9 % — ABNORMAL LOW (ref 36.0–46.0)
Hemoglobin: 11.3 g/dL — ABNORMAL LOW (ref 12.0–15.0)
LYMPHS ABS: 1.2 10*3/uL (ref 0.7–4.0)
Lymphocytes Relative: 38.7 % (ref 12.0–46.0)
MCHC: 32.4 g/dL (ref 30.0–36.0)
MCV: 88.7 fl (ref 78.0–100.0)
MONO ABS: 0.2 10*3/uL (ref 0.1–1.0)
Monocytes Relative: 6.5 % (ref 3.0–12.0)
NEUTROS ABS: 1.7 10*3/uL (ref 1.4–7.7)
NEUTROS PCT: 52.7 % (ref 43.0–77.0)
Platelets: 142 10*3/uL — ABNORMAL LOW (ref 150.0–400.0)
RBC: 3.93 Mil/uL (ref 3.87–5.11)
RDW: 14.6 % (ref 11.5–15.5)
WBC: 3.2 10*3/uL — ABNORMAL LOW (ref 4.0–10.5)

## 2017-07-09 LAB — COMPREHENSIVE METABOLIC PANEL
ALBUMIN: 3.5 g/dL (ref 3.5–5.2)
ALK PHOS: 44 U/L (ref 39–117)
ALT: 11 U/L (ref 0–35)
AST: 20 U/L (ref 0–37)
BUN: 17 mg/dL (ref 6–23)
CO2: 26 mEq/L (ref 19–32)
CREATININE: 0.75 mg/dL (ref 0.40–1.20)
Calcium: 9.6 mg/dL (ref 8.4–10.5)
Chloride: 104 mEq/L (ref 96–112)
GFR: 100.34 mL/min (ref >60.00)
Glucose, Bld: 109 mg/dL — ABNORMAL HIGH (ref 70–99)
Potassium: 3.1 mEq/L — ABNORMAL LOW (ref 3.5–5.1)
Sodium: 137 mEq/L (ref 135–145)
Total Bilirubin: 0.3 mg/dL (ref 0.2–1.2)
Total Protein: 9.2 g/dL — ABNORMAL HIGH (ref 6.0–8.3)

## 2017-07-09 NOTE — Patient Instructions (Signed)
Try Miralax and Benefiber mixed together twice daily in a beverage  Encouraged increased hydration and fiber in diet. Daily probiotics. If bowels not moving can use MOM 2 tbls po in 4 oz of warm prune juice by mouth every 2-3 days. If no results then repeat in 4 hours with  Dulcolax suppository pr, may repeat again in 4 more hours as needed. Seek care if symptoms worsen. Consider daily Miralax and/or Dulcolax if symptoms persist.  Constipation, Adult Constipation is when a person:  Poops (has a bowel movement) fewer times in a week than normal.  Has a hard time pooping.  Has poop that is dry, hard, or bigger than normal.  Follow these instructions at home: Eating and drinking   Eat foods that have a lot of fiber, such as: ? Fresh fruits and vegetables. ? Whole grains. ? Beans.  Eat less of foods that are high in fat, low in fiber, or overly processed, such as: ? [place] fries. ? Hamburgers. ? Cookies. ? Candy. ? Soda.  Drink enough fluid to keep your pee (urine) clear or pale yellow. General instructions  Exercise regularly or as told by your doctor.  Go to the restroom when you feel like you need to poop. Do not hold it in.  Take over-the-counter and prescription medicines only as told by your doctor. These include any fiber supplements.  Do pelvic floor retraining exercises, such as: ? Doing deep breathing while relaxing your lower belly (abdomen). ? Relaxing your pelvic floor while pooping.  Watch your condition for any changes.  Keep all follow-up visits as told by your doctor. This is important. Contact a doctor if:  You have pain that gets worse.  You have a fever.  You have not pooped for 4 days.  You throw up (vomit).  You are not hungry.  You lose weight.  You are bleeding from the anus.  You have thin, pencil-like poop (stool). Get help right away if:  You have a fever, and your symptoms suddenly get worse.  You leak poop or have blood in your  poop.  Your belly feels hard or bigger than normal (is bloated).  You have very bad belly pain.  You feel dizzy or you faint. This information is not intended to replace advice given to you by your health care provider. Make sure you discuss any questions you have with your health care provider. Document Released: 04/13/2008 Document Revised: 05/15/2016 Document Reviewed: 04/15/2016 Elsevier Interactive Patient Education  2017 [employer].

## 2017-07-09 NOTE — Progress Notes (Signed)
Pre visit review using our clinic review tool, if applicable. No additional management support is needed unless otherwise documented below in the visit note. 

## 2017-07-09 NOTE — Assessment & Plan Note (Signed)
Had a bad episode last night but feels better today. She has an appointment with her new gastroenterologist Dr Marin Comment from [facility] in the next couple of weeks

## 2017-07-10 ENCOUNTER — Ambulatory Visit (HOSPITAL_BASED_OUTPATIENT_CLINIC_OR_DEPARTMENT_OTHER)
Admission: RE | Admit: 2017-07-10 | Discharge: 2017-07-10 | Disposition: A | Discharge status: Home / Self Care | Payer: Medicaid Other | Source: Ambulatory Visit | Attending: Family Medicine | Admitting: Family Medicine

## 2017-07-10 DIAGNOSIS — Z9049 Acquired absence of other specified parts of digestive tract: Secondary | ICD-10-CM | POA: Diagnosis not present

## 2017-07-10 DIAGNOSIS — K5900 Constipation, unspecified: Secondary | ICD-10-CM

## 2017-07-10 DIAGNOSIS — R1013 Epigastric pain: Secondary | ICD-10-CM

## 2017-07-12 NOTE — Assessment & Plan Note (Signed)
Encouraged heart healthy diet, increase exercise, avoid trans fats, consider a krill oil cap daily 

## 2017-07-12 NOTE — Assessment & Plan Note (Signed)
Well controlled, no changes to meds. Encouraged heart healthy diet such as the DASH diet and exercise as tolerated.  °

## 2017-07-12 NOTE — Assessment & Plan Note (Signed)
Recently impacted again. Encouraged increased hydration and fiber in diet. Daily probiotics. If bowels not moving can use MOM 2 tbls po in 4 oz of warm prune juice by mouth every 2-3 days. If no results then repeat in 4 hours with  Dulcolax suppository pr, may repeat again in 4 more hours as needed. Seek care if symptoms worsen. Consider daily Miralax and/or Dulcolax if symptoms persist. miralax and benefiber together bid.

## 2017-07-12 NOTE — Progress Notes (Signed)
[name], female   DOB: 1956-05-31, 61 y.o.   MRN: [id number]   Subjective:    [name], female    DOB: June 30, 1956, 61 y.o.   MRN: [id number]  Chief Complaint  Patient presents with  . Follow-up    HPI Patient is in today for hospital follow up. She was hospitalized at [facility] hospital again with constipation and obstruction. She had severe pain, nausea and vomiting. No fevers or chills. Once they were able to get her cleaned out for her colonoscopy she began to feel better. No bloody or tarry stool. Denies CP/palp/SOB/HA/congestion/fevers or GU c/o. Taking meds as prescribed. She has been feeling better and eating better since returning home.  Past Medical History:  Diagnosis Date  . Abnormal SPEP 07/26/2016  . Arthritis    knees, hands  . Back pain 07/14/2016  . Bowel obstruction ([place]) 11/2014  . CHF (congestive heart failure) ([place])   . Congestive heart failure ([place]) 02/24/2015   S/p pacemaker  . Depression   . Epigastric pain 03/22/2017  . GERD (gastroesophageal reflux disease)   . Hyperlipidemia   . Hypertension   . Low back pain 07/14/2016  . 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 Myalgia 12/31/2016  . Obstructive sleep apnea 03/31/2015  . SOB (shortness of breath) 04/09/2016  . Stroke [facility])    TIAs    Past Surgical History:  Procedure Laterality Date  . ABDOMINAL HYSTERECTOMY     menorraghia, 2006, total  . CHOLECYSTECTOMY    . KNEE SURGERY     right, repair torn torn cartialage  . PACEMAKER INSERTION  08/2014  . TONSILLECTOMY    . TUBAL LIGATION      Family History  Problem Relation Age of Onset  . Diabetes Mother   . Hypertension Mother   . Heart disease Mother        s/p 1 stent  . Hyperlipidemia Mother   . Arthritis Mother   . Cancer Father        COLON  . Colon cancer Father 39  . Irritable bowel syndrome Sister   . Hyperlipidemia Daughter   . Hypertension Daughter   . Hypertension Maternal  Grandmother   . Arthritis Maternal Grandmother   . Heart disease Maternal Grandfather        MI  . Hypertension Maternal Grandfather   . Arthritis Maternal Grandfather   . Hypertension Son     Social History   Social History  . Marital status: Single    Spouse name: N/A  . Number of children: N/A  . Years of education: N/A   Occupational History  . [name].   Social History Main Topics  . Smoking status: Never Smoker  . Smokeless tobacco: Never Used  . Alcohol use No  . Drug use: No  . Sexual activity: No     Comment: Lives alone, no dietary restrictions   Other Topics Concern  . [name]   Social History Narrative  . No narrative on file    Outpatient Medications Prior to Visit  Medication Sig Dispense Refill  . acetaminophen (TYLENOL) 325 MG tablet Take 650 mg by mouth.    Marland Kitchen albuterol (PROVENTIL HFA;VENTOLIN HFA) 108 (90 Base) MCG/ACT inhaler Inhale 2 puffs into the lungs every 6 (six) hours as needed for wheezing or shortness of breath. 1 Inhaler 1  . alendronate (FOSAMAX) 70 MG tablet Take 1 tablet (70 mg total) by mouth once a  week. Take with a full glass of water on an empty stomach. 12 tablet 3  . amLODipine (NORVASC) 10 MG tablet Take 10 mg by mouth.    . baclofen (LIORESAL) 20 MG tablet Take 1 tablet (20 mg total) by mouth 3 (three) times daily as needed for muscle spasms. 90 each 2  . carvedilol (COREG) 25 MG tablet Take 1 tablet (25 mg total) by mouth 2 (two) times daily with a meal. 180 tablet 1  . Cholecalciferol (VITAMIN D3) 5000 UNITS CAPS Take 1 capsule by mouth daily.    Marland Kitchen conjugated estrogens (PREMARIN) vaginal cream Place vaginally.    . docusate sodium (COLACE) 50 MG capsule Take 50 mg by mouth 2 (two) times daily.    . ergocalciferol (VITAMIN D2) 50000 units capsule Take 1 capsule (50,000 Units total) by mouth once a week. 12 capsule 1  . escitalopram (LEXAPRO) 20 MG tablet Take 1 tablet (20 mg total) by mouth daily. 90 tablet 1  .  famotidine (PEPCID) 40 MG tablet Take 1 tablet (40 mg total) by mouth at bedtime. 90 tablet 1  . furosemide (LASIX) 20 MG tablet Take 1 tablet (20 mg total) by mouth daily as needed (WT gain>3# in 24 HR). 90 tablet 1  . gabapentin (NEURONTIN) 300 MG capsule Take 1 capsule (300 mg total) by mouth 3 (three) times daily. 90 capsule 3  . hyoscyamine (LEVSIN SL) 0.125 MG SL tablet Place 1 tablet (0.125 mg total) under the tongue every 4 (four) hours as needed. 90 tablet 01  . metoCLOPramide (REGLAN) 10 MG tablet Take 1 tablet (10 mg total) by mouth 4 (four) times daily -  before meals and at bedtime. 60 tablet 4  . modafinil (PROVIGIL) 100 MG tablet Take 1 tablet (100 mg total) by mouth daily. 30 tablet 5  . Multiple Vitamins-Minerals (CENTRUM SILVER ADULT 50+ PO) Take by mouth.    . pantoprazole (PROTONIX) 40 MG tablet Take 1 tablet (40 mg total) by mouth 2 (two) times daily. 180 tablet 1  . potassium chloride SA (KLOR-CON M20) 20 MEQ tablet Take 1 tablet (20 mEq total) by mouth daily. 30 tablet 5  . silver sulfADIAZINE (SILVADENE) 1 % cream Apply 1 application topically daily. 50 g 1  . tiZANidine (ZANAFLEX) 4 MG tablet Take 0.5-1 tablets (2-4 mg total) by mouth at bedtime as needed for muscle spasms. 30 tablet 1  . triamterene-hydrochlorothiazide (MAXZIDE-25) 37.5-25 MG tablet Take 1 tablet by mouth daily. 90 tablet 1  . zolpidem (AMBIEN) 10 MG tablet Take 0.5-1 tablets (5-10 mg total) by mouth at bedtime as needed for sleep. 30 tablet 1   No facility-administered medications prior to visit.     Allergies  Allergen Reactions  . Benazepril Anaphylaxis    angioedema  . Codeine Nausea And Vomiting  . Morphine     Redness and hives noted post IV admin on 07/05/17  . Ondansetron Hcl     Redness and hives post IV admin on 07/05/17    Review of Systems  Constitutional: Positive for malaise/fatigue. Negative for fever.  HENT: Negative for congestion.   Eyes: Negative for blurred vision.    Respiratory: Negative for shortness of breath.   Cardiovascular: Negative for chest pain, palpitations and leg swelling.  Gastrointestinal: Positive for abdominal pain, constipation, heartburn, nausea and vomiting. Negative for blood in stool, diarrhea and melena.  Genitourinary: Negative for dysuria and frequency.  Musculoskeletal: Negative for falls.  Skin: Negative for rash.  Neurological: Negative for dizziness,  loss of consciousness and headaches.  Endo/Heme/Allergies: Negative for environmental allergies.  Psychiatric/Behavioral: Negative for depression. The patient is not nervous/anxious.        Objective:    Physical Exam  Constitutional: She is oriented to person, place, and time. She appears well-developed and well-nourished. No distress.  HENT:  Head: Normocephalic and atraumatic.  Nose: Nose normal.  Eyes: Right eye exhibits no discharge. Left eye exhibits no discharge.  Neck: Normal range of motion. Neck supple.  Cardiovascular: Normal rate and regular rhythm.   No murmur heard. Pulmonary/Chest: Effort normal and breath sounds normal.  Abdominal: Soft. Bowel sounds are normal. There is no tenderness.  Musculoskeletal: She exhibits no edema.  Neurological: She is alert and oriented to person, place, and time.  Skin: Skin is warm and dry.  Psychiatric: She has a normal mood and affect.  Nursing note and vitals reviewed.   BP 128/88   Pulse (!) 110   Temp (!) 97.5 F (36.4 C) (Oral)   Ht '5\' 6"'$  (1.676 m)   Wt 176 lb 3.2 oz (79.9 kg)   SpO2 97%   BMI 28.44 kg/m  Wt Readings from Last 3 Encounters:  07/09/17 176 lb 3.2 oz (79.9 kg)  06/11/17 175 lb 3.2 oz (79.5 kg)  06/11/17 174 lb (78.9 kg)     Lab Results  Component Value Date   WBC 3.2 (L) 07/09/2017   HGB 11.3 (L) 07/09/2017   HCT 34.9 (L) 07/09/2017   PLT 142.0 (L) 07/09/2017   GLUCOSE 109 (H) 07/09/2017   CHOL 163 12/31/2016   TRIG 65.0 12/31/2016   HDL 48.00 12/31/2016   LDLCALC 102 (H)  12/31/2016   ALT 11 07/09/2017   AST 20 07/09/2017   NA 137 07/09/2017   K 3.1 (L) 07/09/2017   CL 104 07/09/2017   CREATININE 0.75 07/09/2017   BUN 17 07/09/2017   CO2 26 07/09/2017   TSH 1.34 12/31/2016    Lab Results  Component Value Date   TSH 1.34 12/31/2016   Lab Results  Component Value Date   WBC 3.2 (L) 07/09/2017   HGB 11.3 (L) 07/09/2017   HCT 34.9 (L) 07/09/2017   MCV 88.7 07/09/2017   PLT 142.0 (L) 07/09/2017   Lab Results  Component Value Date   NA 137 07/09/2017   K 3.1 (L) 07/09/2017   CHLORIDE 108 12/31/2016   CO2 26 07/09/2017   GLUCOSE 109 (H) 07/09/2017   BUN 17 07/09/2017   CREATININE 0.75 07/09/2017   BILITOT 0.3 07/09/2017   ALKPHOS 44 07/09/2017   AST 20 07/09/2017   ALT 11 07/09/2017   PROT 9.2 (H) 07/09/2017   ALBUMIN 3.5 07/09/2017   CALCIUM 9.6 07/09/2017   ANIONGAP 9 12/31/2016   EGFR >90 12/31/2016   GFR 100.34 07/09/2017   Lab Results  Component Value Date   CHOL 163 12/31/2016   Lab Results  Component Value Date   HDL 48.00 12/31/2016   Lab Results  Component Value Date   LDLCALC 102 (H) 12/31/2016   Lab Results  Component Value Date   TRIG 65.0 12/31/2016   Lab Results  Component Value Date   CHOLHDL 3 12/31/2016   No results found for: HGBA1C     Assessment & Plan:   Problem List Items Addressed This Visit    GERD (gastroesophageal reflux disease)    Avoid offending foods, start probiotics. Do not eat large meals in late evening and consider raising head of bed. Continue current meds  Bowel obstruction ([place])    Recently impacted again. Encouraged increased hydration and fiber in diet. Daily probiotics. If bowels not moving can use MOM 2 tbls po in 4 oz of warm prune juice by mouth every 2-3 days. If no results then repeat in 4 hours with  Dulcolax suppository pr, may repeat again in 4 more hours as needed. Seek care if symptoms worsen. Consider daily Miralax and/or Dulcolax if symptoms persist. miralax  and benefiber together bid.      Hyperlipidemia    Encouraged heart healthy diet, increase exercise, avoid trans fats, consider a krill oil cap daily      Hypertension     Well controlled, no changes to meds. Encouraged heart healthy diet such as the DASH diet and exercise as tolerated.       Epigastric pain - Primary    Had a bad episode last night but feels better today. She has an appointment with her new gastroenterologist Dr Marin Comment from [facility] in the next couple of weeks      Relevant Orders   US Abdomen Complete (Completed)   CBC with Differential/Platelet (Completed)   Comprehensive metabolic panel (Completed)    Other Visit Diagnoses    Constipation, unspecified constipation type       Relevant Orders   US Abdomen Complete (Completed)      I am having Ms. Taflinger maintain her docusate sodium, Vitamin D3, Multiple Vitamins-Minerals (CENTRUM SILVER ADULT 50+ PO), escitalopram, modafinil, zolpidem, alendronate, ergocalciferol, albuterol, silver sulfADIAZINE, baclofen, gabapentin, triamterene-hydrochlorothiazide, tiZANidine, conjugated estrogens, carvedilol, furosemide, pantoprazole, potassium chloride SA, acetaminophen, amLODipine, metoCLOPramide, hyoscyamine, and famotidine.  No orders of the defined types were placed in this encounter.    Penni Homans, MD

## 2017-07-12 NOTE — Assessment & Plan Note (Signed)
Avoid offending foods, start probiotics. Do not eat large meals in late evening and consider raising head of bed. Continue current meds 

## 2017-07-16 ENCOUNTER — Ambulatory Visit: Payer: Self-pay | Admitting: Family Medicine

## 2017-07-19 ENCOUNTER — Other Ambulatory Visit (HOSPITAL_BASED_OUTPATIENT_CLINIC_OR_DEPARTMENT_OTHER): Payer: Medicaid Other

## 2017-07-19 ENCOUNTER — Ambulatory Visit (HOSPITAL_BASED_OUTPATIENT_CLINIC_OR_DEPARTMENT_OTHER): Payer: Medicaid Other | Admitting: Hematology & Oncology

## 2017-07-19 VITALS — BP 143/94 | HR 106 | Temp 98.3°F | Wt 174.0 lb

## 2017-07-19 DIAGNOSIS — R109 Unspecified abdominal pain: Secondary | ICD-10-CM | POA: Diagnosis not present

## 2017-07-19 DIAGNOSIS — D472 Monoclonal gammopathy: Secondary | ICD-10-CM

## 2017-07-19 DIAGNOSIS — K2100 Gastro-esophageal reflux disease with esophagitis, without bleeding: Secondary | ICD-10-CM

## 2017-07-19 LAB — CBC WITH DIFFERENTIAL (CANCER CENTER ONLY)
BASO#: 0 10*3/uL (ref 0.0–0.2)
BASO%: 0.7 % (ref 0.0–2.0)
EOS%: 2.4 % (ref 0.0–7.0)
Eosinophils Absolute: 0.1 10*3/uL (ref 0.0–0.5)
HEMATOCRIT: 34.2 % — AB (ref 34.8–46.6)
HEMOGLOBIN: 11.3 g/dL — AB (ref 11.6–15.9)
LYMPH#: 0.9 10*3/uL (ref 0.9–3.3)
LYMPH%: 31.4 % (ref 14.0–48.0)
MCH: 29.3 pg (ref 26.0–34.0)
MCHC: 33 g/dL (ref 32.0–36.0)
MCV: 89 fL (ref 81–101)
MONO#: 0.2 10*3/uL (ref 0.1–0.9)
MONO%: 7 % (ref 0.0–13.0)
NEUT%: 58.5 % (ref 39.6–80.0)
NEUTROS ABS: 1.7 10*3/uL (ref 1.5–6.5)
Platelets: 134 10*3/uL — ABNORMAL LOW (ref 145–400)
RBC: 3.86 10*6/uL (ref 3.70–5.32)
RDW: 13.6 % (ref 11.1–15.7)
WBC: 2.9 10*3/uL — ABNORMAL LOW (ref 3.9–10.0)

## 2017-07-19 LAB — CMP (CANCER CENTER ONLY)
ALBUMIN: 3.4 g/dL (ref 3.3–5.5)
ALT(SGPT): 17 U/L (ref 10–47)
AST: 28 U/L (ref 11–38)
Alkaline Phosphatase: 48 U/L (ref 26–84)
BUN, Bld: 12 mg/dL (ref 7–22)
CHLORIDE: 106 meq/L (ref 98–108)
CO2: 28 mEq/L (ref 18–33)
CREATININE: 0.7 mg/dL (ref 0.6–1.2)
Calcium: 9.3 mg/dL (ref 8.0–10.3)
Glucose, Bld: 101 mg/dL (ref 73–118)
POTASSIUM: 3.2 meq/L — AB (ref 3.3–4.7)
Sodium: 140 mEq/L (ref 128–145)
Total Bilirubin: 0.7 mg/dl (ref 0.20–1.60)
Total Protein: 9.6 g/dL — ABNORMAL HIGH (ref 6.4–8.1)

## 2017-07-19 NOTE — Progress Notes (Signed)
Hematology and Oncology Follow Up Visit  Agueda Houpt [id number] 25-Mar-1956 61 y.o. 07/19/2017   Principle Diagnosis:   IgG Kappa MGUS  Current Therapy:    Observation     Interim History:  Ms. Gilchrest is back for follow-up. She feels better. There is still some epigastric discomfort.  She's had a very thorough workup for her abdominal pain. So far, nothing has shown up. She had an ultrasound done on September 1. This was also unremarkable.  As far as a MGUS is concerned, her last M spike was 2.1 g/dL. This is stable. Her IgG level was 3480 milligrams per deciliter. Her Kappa Lightchain was 10 mg/dL.  She's had no fever. There's been no diarrhea. She's had no bleeding. There's been no cough or shortness of breath. She's had no bony pain.  There has been no leg swelling.   Overall, her performance status is ECOG 1.   Medications:  Current Outpatient Prescriptions:  .  acetaminophen (TYLENOL) 325 MG tablet, Take 650 mg by mouth., Disp: , Rfl:  .  albuterol (PROVENTIL HFA;VENTOLIN HFA) 108 (90 Base) MCG/ACT inhaler, Inhale 2 puffs into the lungs every 6 (six) hours as needed for wheezing or shortness of breath., Disp: 1 Inhaler, Rfl: 1 .  alendronate (FOSAMAX) 70 MG tablet, Take 1 tablet (70 mg total) by mouth once a week. Take with a full glass of water on an empty stomach., Disp: 12 tablet, Rfl: 3 .  amLODipine (NORVASC) 10 MG tablet, Take 10 mg by mouth., Disp: , Rfl:  .  baclofen (LIORESAL) 20 MG tablet, Take 1 tablet (20 mg total) by mouth 3 (three) times daily as needed for muscle spasms., Disp: 90 each, Rfl: 2 .  carvedilol (COREG) 25 MG tablet, Take 1 tablet (25 mg total) by mouth 2 (two) times daily with a meal., Disp: 180 tablet, Rfl: 1 .  Cholecalciferol (VITAMIN D3) 5000 UNITS CAPS, Take 1 capsule by mouth daily., Disp: , Rfl:  .  conjugated estrogens (PREMARIN) vaginal cream, Place vaginally., Disp: , Rfl:  .  docusate sodium (COLACE) 50 MG capsule, Take 50 mg by mouth 2  (two) times daily., Disp: , Rfl:  .  ergocalciferol (VITAMIN D2) 50000 units capsule, Take 1 capsule (50,000 Units total) by mouth once a week., Disp: 12 capsule, Rfl: 1 .  escitalopram (LEXAPRO) 20 MG tablet, Take 1 tablet (20 mg total) by mouth daily., Disp: 90 tablet, Rfl: 1 .  famotidine (PEPCID) 40 MG tablet, Take 1 tablet (40 mg total) by mouth at bedtime., Disp: 90 tablet, Rfl: 1 .  furosemide (LASIX) 20 MG tablet, Take 1 tablet (20 mg total) by mouth daily as needed (WT gain>3# in 24 HR)., Disp: 90 tablet, Rfl: 1 .  gabapentin (NEURONTIN) 300 MG capsule, Take 1 capsule (300 mg total) by mouth 3 (three) times daily., Disp: 90 capsule, Rfl: 3 .  hyoscyamine (LEVSIN SL) 0.125 MG SL tablet, Place 1 tablet (0.125 mg total) under the tongue every 4 (four) hours as needed., Disp: 90 tablet, Rfl: 01 .  metoCLOPramide (REGLAN) 10 MG tablet, Take 1 tablet (10 mg total) by mouth 4 (four) times daily -  before meals and at bedtime., Disp: 60 tablet, Rfl: 4 .  modafinil (PROVIGIL) 100 MG tablet, Take 1 tablet (100 mg total) by mouth daily., Disp: 30 tablet, Rfl: 5 .  Multiple Vitamins-Minerals (CENTRUM SILVER ADULT 50+ PO), Take by mouth., Disp: , Rfl:  .  pantoprazole (PROTONIX) 40 MG tablet, Take 1 tablet (40 mg  total) by mouth 2 (two) times daily., Disp: 180 tablet, Rfl: 1 .  potassium chloride SA (KLOR-CON M20) 20 MEQ tablet, Take 1 tablet (20 mEq total) by mouth daily., Disp: 30 tablet, Rfl: 5 .  silver sulfADIAZINE (SILVADENE) 1 % cream, Apply 1 application topically daily., Disp: 50 g, Rfl: 1 .  tiZANidine (ZANAFLEX) 4 MG tablet, Take 0.5-1 tablets (2-4 mg total) by mouth at bedtime as needed for muscle spasms., Disp: 30 tablet, Rfl: 1 .  triamterene-hydrochlorothiazide (MAXZIDE-25) 37.5-25 MG tablet, Take 1 tablet by mouth daily., Disp: 90 tablet, Rfl: 1 .  zolpidem (AMBIEN) 10 MG tablet, Take 0.5-1 tablets (5-10 mg total) by mouth at bedtime as needed for sleep., Disp: 30 tablet, Rfl:  1  Allergies:  Allergies  Allergen Reactions  . Benazepril Anaphylaxis    angioedema  . Codeine Nausea And Vomiting  . Morphine     Redness and hives noted post IV admin on 07/05/17  . Ondansetron Hcl     Redness and hives post IV admin on 07/05/17    Past Medical History, Surgical history, Social history, and Family History were reviewed and updated.  Review of Systems: As stated in the interim history   Physical Exam:  weight is 174 lb (78.9 kg). Her oral temperature is 98.3 F (36.8 C). Her blood pressure is 143/94 (abnormal) and her pulse is 106 (abnormal).   Wt Readings from Last 3 Encounters:  07/19/17 174 lb (78.9 kg)  07/09/17 176 lb 3.2 oz (79.9 kg)  06/11/17 175 lb 3.2 oz (79.5 kg)     Physical Exam  Constitutional: She is oriented to person, place, and time.  HENT:  Head: Normocephalic and atraumatic.  Mouth/Throat: Oropharynx is clear and moist.  Eyes: Pupils are equal, round, and reactive to light. EOM are normal.  Neck: Normal range of motion.  Cardiovascular: Normal rate, regular rhythm and normal heart sounds.   Pulmonary/Chest: Effort normal and breath sounds normal.  Abdominal: Soft. Bowel sounds are normal.  Musculoskeletal: Normal range of motion. She exhibits no edema, tenderness or deformity.  Lymphadenopathy:    She has no cervical adenopathy.  Neurological: She is alert and oriented to person, place, and time.  Skin: Skin is warm and dry. No rash noted. No erythema.  Psychiatric: She has a normal mood and affect. Her behavior is normal. Judgment and thought content normal.  Vitals reviewed.  a.   Lab Results  Component Value Date   WBC 2.9 (L) 07/19/2017   HGB 11.3 (L) 07/19/2017   HCT 34.2 (L) 07/19/2017   MCV 89 07/19/2017   PLT 134 (L) 07/19/2017     Chemistry      Component Value Date/Time   NA 137 07/09/2017 1209   NA 138 06/11/2017 0816   NA 140 12/31/2016 1000   K 3.1 (L) 07/09/2017 1209   K 3.4 06/11/2017 0816   K 3.6  12/31/2016 1000   CL 104 07/09/2017 1209   CL 104 06/11/2017 0816   CO2 26 07/09/2017 1209   CO2 29 06/11/2017 0816   CO2 24 12/31/2016 1000   BUN 17 07/09/2017 1209   BUN 14 06/11/2017 0816   BUN 22.3 12/31/2016 1000   CREATININE 0.75 07/09/2017 1209   CREATININE 1.0 06/11/2017 0816   CREATININE 0.8 12/31/2016 1000      Component Value Date/Time   CALCIUM 9.6 07/09/2017 1209   CALCIUM 9.3 06/11/2017 0816   CALCIUM 9.5 12/31/2016 1000   ALKPHOS 44 07/09/2017 1209  ALKPHOS 49 06/11/2017 0816   ALKPHOS 51 12/31/2016 1000   AST 20 07/09/2017 1209   AST 34 06/11/2017 0816   AST 28 12/31/2016 1000   ALT 11 07/09/2017 1209   ALT 29 06/11/2017 0816   ALT 16 12/31/2016 1000   BILITOT 0.3 07/09/2017 1209   BILITOT 0.50 06/11/2017 0816   BILITOT 0.46 12/31/2016 1000         Impression and Plan: Ms. Massaro is a 61 year old African-American female. She has a monoclonal gammopathy. I would say this is an IgG kappa MGUS.   So far, her M spike is holding stable. Her IgG level is creeping up a little bit. The kappa light chain also is up a little bit.  We will have to watch her closely.  I'm still not sure what the abdominal discomfort is about. She has been through a very extensive evaluation. She had an ultrasound done on September 1. This really was unremarkable.  For right now, we'll plan to get her back in 3 more months. I think this would be reasonable for follow-up.  [name], MD 9/10/20188:26 AM

## 2017-07-20 LAB — KAPPA/LAMBDA LIGHT CHAINS
IG KAPPA FREE LIGHT CHAIN: 82.5 mg/L — AB (ref 3.3–19.4)
Ig Lambda Free Light Chain: 12.5 mg/L (ref 5.7–26.3)
Kappa/Lambda FluidC Ratio: 6.6 — ABNORMAL HIGH (ref 0.26–1.65)

## 2017-07-20 LAB — IGG, IGA, IGM
IGA/IMMUNOGLOBULIN A, SERUM: 302 mg/dL (ref 87–352)
IGM (IMMUNOGLOBIN M), SRM: 169 mg/dL (ref 26–217)

## 2017-07-22 LAB — PROTEIN ELECTROPHORESIS, SERUM, WITH REFLEX
A/G Ratio: 0.6 — ABNORMAL LOW (ref 0.7–1.7)
ALBUMIN: 3.5 g/dL (ref 2.9–4.4)
ALPHA 1: 0.3 g/dL (ref 0.0–0.4)
Alpha 2: 1 g/dL (ref 0.4–1.0)
BETA: 1.2 g/dL (ref 0.7–1.3)
Gamma Globulin: 3.4 g/dL — ABNORMAL HIGH (ref 0.4–1.8)
Globulin, Total: 5.8 g/dL — ABNORMAL HIGH (ref 2.2–3.9)
Interpretation(See Below): 0
M-Spike, %: 2.6 g/dL — ABNORMAL HIGH
Total Protein: 9.3 g/dL — ABNORMAL HIGH (ref 6.0–8.5)

## 2017-07-27 ENCOUNTER — Ambulatory Visit (HOSPITAL_BASED_OUTPATIENT_CLINIC_OR_DEPARTMENT_OTHER)
Admission: RE | Admit: 2017-07-27 | Discharge: 2017-07-27 | Disposition: A | Discharge status: Home / Self Care | Payer: Medicaid Other | Source: Ambulatory Visit | Attending: Family Medicine | Admitting: Family Medicine

## 2017-07-27 ENCOUNTER — Ambulatory Visit (INDEPENDENT_AMBULATORY_CARE_PROVIDER_SITE_OTHER): Payer: Medicaid Other | Admitting: Family Medicine

## 2017-07-27 VITALS — BP 116/84 | HR 102 | Temp 98.2°F | Resp 18 | Wt 174.8 lb

## 2017-07-27 DIAGNOSIS — K2100 Gastro-esophageal reflux disease with esophagitis, without bleeding: Secondary | ICD-10-CM

## 2017-07-27 DIAGNOSIS — Z23 Encounter for immunization: Secondary | ICD-10-CM

## 2017-07-27 DIAGNOSIS — I517 Cardiomegaly: Secondary | ICD-10-CM | POA: Insufficient documentation

## 2017-07-27 DIAGNOSIS — K56609 Unspecified intestinal obstruction, unspecified as to partial versus complete obstruction: Secondary | ICD-10-CM | POA: Diagnosis not present

## 2017-07-27 DIAGNOSIS — I7789 Other specified disorders of arteries and arterioles: Secondary | ICD-10-CM

## 2017-07-27 DIAGNOSIS — R109 Unspecified abdominal pain: Secondary | ICD-10-CM

## 2017-07-27 DIAGNOSIS — E785 Hyperlipidemia, unspecified: Secondary | ICD-10-CM

## 2017-07-27 DIAGNOSIS — J9811 Atelectasis: Secondary | ICD-10-CM | POA: Insufficient documentation

## 2017-07-27 DIAGNOSIS — D472 Monoclonal gammopathy: Secondary | ICD-10-CM

## 2017-07-27 DIAGNOSIS — I10 Essential (primary) hypertension: Secondary | ICD-10-CM | POA: Diagnosis not present

## 2017-07-27 DIAGNOSIS — Z95810 Presence of automatic (implantable) cardiac defibrillator: Secondary | ICD-10-CM | POA: Diagnosis not present

## 2017-07-27 LAB — COMPREHENSIVE METABOLIC PANEL
ALBUMIN: 3.4 g/dL — AB (ref 3.5–5.2)
ALK PHOS: 43 U/L (ref 39–117)
ALT: 10 U/L (ref 0–35)
AST: 17 U/L (ref 0–37)
BILIRUBIN TOTAL: 0.3 mg/dL (ref 0.2–1.2)
BUN: 18 mg/dL (ref 6–23)
CALCIUM: 9.4 mg/dL (ref 8.4–10.5)
CO2: 27 mEq/L (ref 19–32)
Chloride: 106 mEq/L (ref 96–112)
Creatinine, Ser: 0.66 mg/dL (ref 0.40–1.20)
GFR: 116.27 mL/min (ref >60.00)
Glucose, Bld: 117 mg/dL — ABNORMAL HIGH (ref 70–99)
Potassium: 3.4 mEq/L — ABNORMAL LOW (ref 3.5–5.1)
Sodium: 138 mEq/L (ref 135–145)
TOTAL PROTEIN: 9.4 g/dL — AB (ref 6.0–8.3)

## 2017-07-27 LAB — SEDIMENTATION RATE: Sed Rate: 121 mm/hr — ABNORMAL HIGH (ref 0–30)

## 2017-07-27 LAB — CBC WITH DIFFERENTIAL/PLATELET
BASOS ABS: 0 10*3/uL (ref 0.0–0.1)
Basophils Relative: 1 % (ref 0.0–3.0)
Eosinophils Absolute: 0 10*3/uL (ref 0.0–0.7)
Eosinophils Relative: 1.4 % (ref 0.0–5.0)
HCT: 34.9 % — ABNORMAL LOW (ref 36.0–46.0)
HEMOGLOBIN: 11.2 g/dL — AB (ref 12.0–15.0)
LYMPHS PCT: 32.2 % (ref 12.0–46.0)
Lymphs Abs: 0.8 10*3/uL (ref 0.7–4.0)
MCHC: 32.2 g/dL (ref 30.0–36.0)
MCV: 88.5 fl (ref 78.0–100.0)
MONOS PCT: 8.7 % (ref 3.0–12.0)
Monocytes Absolute: 0.2 10*3/uL (ref 0.1–1.0)
NEUTROS ABS: 1.4 10*3/uL (ref 1.4–7.7)
Neutrophils Relative %: 56.7 % (ref 43.0–77.0)
Platelets: 132 10*3/uL — ABNORMAL LOW (ref 150.0–400.0)
RBC: 3.94 Mil/uL (ref 3.87–5.11)
RDW: 14.8 % (ref 11.5–15.5)
WBC: 2.5 10*3/uL — AB (ref 4.0–10.5)

## 2017-07-27 MED ORDER — DICYCLOMINE HCL 20 MG PO TABS: 20.0000 mg | ORAL_TABLET | Freq: Three times a day (TID) | ORAL | 1 refills | Status: DC | PRN

## 2017-07-27 MED ORDER — FUROSEMIDE 20 MG PO TABS: 20.0000 mg | ORAL_TABLET | Freq: Every day | ORAL | 1 refills | Status: DC | PRN

## 2017-07-27 MED ORDER — TRIAMTERENE-HCTZ 37.5-25 MG PO TABS
1.0000 | ORAL_TABLET | Freq: Every day | ORAL | 1 refills | Status: DC
Start: 2017-07-27 — End: 2017-10-11

## 2017-07-27 MED ORDER — ESCITALOPRAM OXALATE 10 MG PO TABS: 10.0000 mg | ORAL_TABLET | Freq: Every day | ORAL | 3 refills | Status: DC

## 2017-07-27 MED ORDER — PANTOPRAZOLE SODIUM 40 MG PO TBEC: 40.0000 mg | DELAYED_RELEASE_TABLET | Freq: Two times a day (BID) | ORAL | 1 refills | Status: DC

## 2017-07-27 MED ORDER — SUCRALFATE 1 G PO TABS
1.0000 g | ORAL_TABLET | Freq: Three times a day (TID) | ORAL | 3 refills | Status: DC
Start: 2017-07-27 — End: 2017-10-11

## 2017-07-27 MED ORDER — CARVEDILOL 25 MG PO TABS: 25.0000 mg | ORAL_TABLET | Freq: Two times a day (BID) | ORAL | 1 refills | Status: DC

## 2017-07-27 MED ORDER — AMLODIPINE BESYLATE 10 MG PO TABS: 10.0000 mg | ORAL_TABLET | Freq: Every day | ORAL | 1 refills | Status: DC

## 2017-07-27 MED ORDER — POTASSIUM CHLORIDE CRYS ER 20 MEQ PO TBCR: 20.0000 meq | EXTENDED_RELEASE_TABLET | Freq: Every day | ORAL | 5 refills | Status: DC

## 2017-07-27 NOTE — Assessment & Plan Note (Signed)
Well controlled, no changes to meds. Encouraged heart healthy diet such as the DASH diet and exercise as tolerated.  °

## 2017-07-27 NOTE — Progress Notes (Signed)
Subjective:  I acted as a Neurosurgeon for Dr. Abner Greenspan. [place], [place]  [name], female    DOB: Oct 07, 1956, 61 y.o.   MRN: [id number]  No chief complaint on file.   HPI  Patient is in today for abdominal pain. She states she has had abdominal pain for over a year now, she states she continue to go to the ER and they can not seem to find out what is wrong with her abdomen. Today she states her abdomen is painful, with burning, cramping and she feels nausea at times. No recent febrile illness or acute hospitalizations. Denies CP/palp/SOB/HA/congestion/fevers/GI or GU c/o. Taking meds as prescribed    Patient Care Team: [name], MD as PCP - General (Family Medicine)   Past Medical History:  Diagnosis Date  . Abnormal SPEP 07/26/2016  . Arthritis    knees, hands  . Back pain 07/14/2016  . Bowel obstruction (HCC) 11/2014  . CHF (congestive heart failure) (HCC)   . Congestive heart failure (HCC) 02/24/2015   S/p pacemaker  . Depression   . Epigastric pain 03/22/2017  . GERD (gastroesophageal reflux disease)   . Hyperlipidemia   . Hypertension   . Low back pain 07/14/2016  . 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 Myalgia 12/31/2016  . Obstructive sleep apnea 03/31/2015  . SOB (shortness of breath) 04/09/2016  . Stroke [facility])    TIAs    Past Surgical History:  Procedure Laterality Date  . ABDOMINAL HYSTERECTOMY     menorraghia, 2006, total  . CHOLECYSTECTOMY    . KNEE SURGERY     right, repair torn torn cartialage  . PACEMAKER INSERTION  08/2014  . TONSILLECTOMY    . TUBAL LIGATION      Family History  Problem Relation Age of Onset  . Diabetes Mother   . Hypertension Mother   . Heart disease Mother        s/p 1 stent  . Hyperlipidemia Mother   . Arthritis Mother   . Cancer Father        COLON  . Colon cancer Father 57  . Irritable bowel syndrome Sister   . Hyperlipidemia Daughter   . Hypertension Daughter   . Hypertension  Maternal Grandmother   . Arthritis Maternal Grandmother   . Heart disease Maternal Grandfather        MI  . Hypertension Maternal Grandfather   . Arthritis Maternal Grandfather   . Hypertension Son     Social History   Social History  . Marital status: Single    Spouse name: N/A  . Number of children: N/A  . Years of education: N/A   Occupational History  . [name].   Social History Main Topics  . Smoking status: Never Smoker  . Smokeless tobacco: Never Used  . Alcohol use No  . Drug use: No  . Sexual activity: No     Comment: Lives alone, no dietary restrictions   Other Topics Concern  . [name]   Social History Narrative  . No narrative on file    Outpatient Medications Prior to Visit  Medication Sig Dispense Refill  . acetaminophen (TYLENOL) 325 MG tablet Take 650 mg by mouth.    Marland Kitchen albuterol (PROVENTIL HFA;VENTOLIN HFA) 108 (90 Base) MCG/ACT inhaler Inhale 2 puffs into the lungs every 6 (six) hours as needed for wheezing or shortness of breath. 1 Inhaler 1  . baclofen (LIORESAL) 20 MG tablet Take 1 tablet (  20 mg total) by mouth 3 (three) times daily as needed for muscle spasms. 90 each 2  . Cholecalciferol (VITAMIN D3) 5000 UNITS CAPS Take 1 capsule by mouth daily.    Marland Kitchen conjugated estrogens (PREMARIN) vaginal cream Place vaginally.    . docusate sodium (COLACE) 50 MG capsule Take 50 mg by mouth 2 (two) times daily.    . ergocalciferol (VITAMIN D2) 50000 units capsule Take 1 capsule (50,000 Units total) by mouth once a week. 12 capsule 1  . famotidine (PEPCID) 40 MG tablet Take 1 tablet (40 mg total) by mouth at bedtime. 90 tablet 1  . gabapentin (NEURONTIN) 300 MG capsule Take 1 capsule (300 mg total) by mouth 3 (three) times daily. 90 capsule 3  . hyoscyamine (LEVSIN SL) 0.125 MG SL tablet Place 1 tablet (0.125 mg total) under the tongue every 4 (four) hours as needed. 90 tablet 01  . metoCLOPramide (REGLAN) 10 MG tablet Take 1 tablet (10 mg total) by mouth  4 (four) times daily -  before meals and at bedtime. 60 tablet 4  . modafinil (PROVIGIL) 100 MG tablet Take 1 tablet (100 mg total) by mouth daily. 30 tablet 5  . Multiple Vitamins-Minerals (CENTRUM SILVER ADULT 50+ PO) Take by mouth.    . silver sulfADIAZINE (SILVADENE) 1 % cream Apply 1 application topically daily. 50 g 1  . tiZANidine (ZANAFLEX) 4 MG tablet Take 0.5-1 tablets (2-4 mg total) by mouth at bedtime as needed for muscle spasms. 30 tablet 1  . alendronate (FOSAMAX) 70 MG tablet Take 1 tablet (70 mg total) by mouth once a week. Take with a full glass of water on an empty stomach. 12 tablet 3  . amLODipine (NORVASC) 10 MG tablet Take 10 mg by mouth.    . carvedilol (COREG) 25 MG tablet Take 1 tablet (25 mg total) by mouth 2 (two) times daily with a meal. 180 tablet 1  . escitalopram (LEXAPRO) 20 MG tablet Take 1 tablet (20 mg total) by mouth daily. 90 tablet 1  . furosemide (LASIX) 20 MG tablet Take 1 tablet (20 mg total) by mouth daily as needed (WT gain>3# in 24 HR). 90 tablet 1  . pantoprazole (PROTONIX) 40 MG tablet Take 1 tablet (40 mg total) by mouth 2 (two) times daily. 180 tablet 1  . potassium chloride SA (KLOR-CON M20) 20 MEQ tablet Take 1 tablet (20 mEq total) by mouth daily. 30 tablet 5  . triamterene-hydrochlorothiazide (MAXZIDE-25) 37.5-25 MG tablet Take 1 tablet by mouth daily. 90 tablet 1  . zolpidem (AMBIEN) 10 MG tablet Take 0.5-1 tablets (5-10 mg total) by mouth at bedtime as needed for sleep. 30 tablet 1   No facility-administered medications prior to visit.     Allergies  Allergen Reactions  . Benazepril Anaphylaxis    angioedema  . Codeine Nausea And Vomiting  . Morphine     Redness and hives noted post IV admin on 07/05/17  . Ondansetron Hcl     Redness and hives post IV admin on 07/05/17    Review of Systems  Constitutional: Positive for chills and malaise/fatigue. Negative for fever.  HENT: Negative for congestion.   Eyes: Negative for blurred  vision.  Respiratory: Negative for cough and shortness of breath.   Cardiovascular: Negative for chest pain, palpitations and leg swelling.  Gastrointestinal: Positive for abdominal pain and nausea. Negative for vomiting.  Musculoskeletal: Positive for myalgias. Negative for back pain.  Skin: Negative for rash.  Neurological: Negative for loss of  consciousness and headaches.  Psychiatric/Behavioral: Positive for depression. The patient is nervous/anxious.        Objective:    Physical Exam  Constitutional: She is oriented to person, place, and time. She appears well-developed and well-nourished. No distress.  HENT:  Head: Normocephalic and atraumatic.  Nose: Nose normal.  Eyes: Right eye exhibits no discharge. Left eye exhibits no discharge.  Neck: Normal range of motion. Neck supple.  Cardiovascular: Normal rate and regular rhythm.   No murmur heard. Pulmonary/Chest: Effort normal and breath sounds normal.  Abdominal: Soft. Bowel sounds are normal. She exhibits no distension and no mass. There is tenderness. There is no rebound and no guarding.  Musculoskeletal: She exhibits no edema.  Neurological: She is alert and oriented to person, place, and time.  Skin: Skin is warm and dry.  Psychiatric: She has a normal mood and affect.  Nursing note and vitals reviewed.   BP 116/84 (BP Location: Left Arm, Patient Position: Sitting, Cuff Size: Normal)   Pulse (!) 102   Temp 98.2 F (36.8 C) (Oral)   Resp 18   Wt 174 lb 12.8 oz (79.3 kg)   SpO2 97%   BMI 28.21 kg/m  Wt Readings from Last 3 Encounters:  07/27/17 174 lb 12.8 oz (79.3 kg)  07/19/17 174 lb (78.9 kg)  07/09/17 176 lb 3.2 oz (79.9 kg)   BP Readings from Last 3 Encounters:  07/27/17 116/84  07/19/17 (!) 143/94  07/09/17 128/88     Immunization History  Administered Date(s) Administered  . Influenza Whole 09/09/2009, 09/12/2013, 07/12/2015  . Influenza,inj,Quad PF,6+ Mos 07/14/2016, 07/27/2017  .  Influenza-Unspecified 08/23/2014, 07/06/2015  . Pneumococcal Conjugate-13 07/12/2015  . Pneumococcal Polysaccharide-23 09/06/2014, 07/12/2015  . Pneumococcal-Unspecified 07/06/2015  . Tdap 12/01/2010, 10/11/2015  . Zoster 10/11/2015, 09/15/2016    Health Maintenance  Topic Date Due  . MAMMOGRAM  12/23/2017  . PAP SMEAR  10/10/2018  . COLONOSCOPY  10/26/2019  . TETANUS/TDAP  10/10/2025  . INFLUENZA VACCINE  Completed  . Hepatitis C Screening  Completed  . HIV Screening  Completed    Lab Results  Component Value Date   WBC 2.5 (L) 07/27/2017   HGB 11.2 (L) 07/27/2017   HCT 34.9 (L) 07/27/2017   PLT 132.0 (L) 07/27/2017   GLUCOSE 117 (H) 07/27/2017   CHOL 163 12/31/2016   TRIG 65.0 12/31/2016   HDL 48.00 12/31/2016   LDLCALC 102 (H) 12/31/2016   ALT 10 07/27/2017   AST 17 07/27/2017   NA 138 07/27/2017   K 3.4 (L) 07/27/2017   CL 106 07/27/2017   CREATININE 0.66 07/27/2017   BUN 18 07/27/2017   CO2 27 07/27/2017   TSH 1.34 12/31/2016    Lab Results  Component Value Date   TSH 1.34 12/31/2016   Lab Results  Component Value Date   WBC 2.5 (L) 07/27/2017   HGB 11.2 (L) 07/27/2017   HCT 34.9 (L) 07/27/2017   MCV 88.5 07/27/2017   PLT 132.0 (L) 07/27/2017   Lab Results  Component Value Date   NA 138 07/27/2017   K 3.4 (L) 07/27/2017   CHLORIDE 108 12/31/2016   CO2 27 07/27/2017   GLUCOSE 117 (H) 07/27/2017   BUN 18 07/27/2017   CREATININE 0.66 07/27/2017   BILITOT 0.3 07/27/2017   ALKPHOS 43 07/27/2017   AST 17 07/27/2017   ALT 10 07/27/2017   PROT 9.4 (H) 07/27/2017   ALBUMIN 3.4 (L) 07/27/2017   CALCIUM 9.4 07/27/2017   ANIONGAP 9 12/31/2016  EGFR >90 12/31/2016   GFR 116.27 07/27/2017   Lab Results  Component Value Date   CHOL 163 12/31/2016   Lab Results  Component Value Date   HDL 48.00 12/31/2016   Lab Results  Component Value Date   LDLCALC 102 (H) 12/31/2016   Lab Results  Component Value Date   TRIG 65.0 12/31/2016   Lab Results   Component Value Date   CHOLHDL 3 12/31/2016   No results found for: HGBA1C       Assessment & Plan:   Problem List Items Addressed This Visit    Monoclonal gammopathy of unknown significance (MGUS) (Chronic)   Relevant Medications   amLODipine (NORVASC) 10 MG tablet   GERD (gastroesophageal reflux disease)   Relevant Medications   amLODipine (NORVASC) 10 MG tablet   pantoprazole (PROTONIX) 40 MG tablet   dicyclomine (BENTYL) 20 MG tablet   sucralfate (CARAFATE) 1 g tablet   Other Relevant Orders   Comprehensive metabolic panel (Completed)   Bowel obstruction (HCC)    Moving bowels daily but small amounts. No blood or tarry stool. Increasing pain. Order abdominal xray and labs, she sees gastroenterology in [facility] tomorrow. She will let us know if no resolution patient call for further imaging to further investigate.       Hyperlipidemia    Encouraged heart healthy diet, increase exercise, avoid trans fats, consider a krill oil cap daily      Relevant Medications   carvedilol (COREG) 25 MG tablet   triamterene-hydrochlorothiazide (MAXZIDE-25) 37.5-25 MG tablet   furosemide (LASIX) 20 MG tablet   amLODipine (NORVASC) 10 MG tablet   Hypertension    Well controlled, no changes to meds. Encouraged heart healthy diet such as the DASH diet and exercise as tolerated.       Relevant Medications   carvedilol (COREG) 25 MG tablet   triamterene-hydrochlorothiazide (MAXZIDE-25) 37.5-25 MG tablet   furosemide (LASIX) 20 MG tablet   amLODipine (NORVASC) 10 MG tablet   Other Relevant Orders   CBC with Differential/Platelet (Completed)   Comprehensive metabolic panel (Completed)   Sedimentation rate (Completed)   AP (abdominal pain)   Relevant Orders   CBC with Differential/Platelet (Completed)   Comprehensive metabolic panel (Completed)   Urine Culture (Completed)   Urinalysis   Sedimentation rate (Completed)   DG Abd 2 Views (Completed)   US Aorta   Enlarged aorta (HCC)    Relevant Medications   carvedilol (COREG) 25 MG tablet   triamterene-hydrochlorothiazide (MAXZIDE-25) 37.5-25 MG tablet   furosemide (LASIX) 20 MG tablet   amLODipine (NORVASC) 10 MG tablet   Other Relevant Orders   US Aorta    Other Visit Diagnoses    Needs flu shot    -  Primary   Relevant Orders   Flu Vaccine QUAD 6+ mos PF IM (Fluarix Quad PF) (Completed)      I have discontinued Ms. Vanpatten's escitalopram, alendronate, and potassium chloride SA. I have also changed her amLODipine. Additionally, I am having her start on dicyclomine, sucralfate, and escitalopram. Lastly, I am having her maintain her docusate sodium, Vitamin D3, Multiple Vitamins-Minerals (CENTRUM SILVER ADULT 50+ PO), modafinil, zolpidem, ergocalciferol, albuterol, silver sulfADIAZINE, baclofen, gabapentin, tiZANidine, conjugated estrogens, acetaminophen, metoCLOPramide, hyoscyamine, famotidine, carvedilol, triamterene-hydrochlorothiazide, furosemide, and pantoprazole.  Meds ordered this encounter  Medications  . carvedilol (COREG) 25 MG tablet    Sig: Take 1 tablet (25 mg total) by mouth 2 (two) times daily with a meal.    Dispense:  180 tablet  Refill:  1  . triamterene-hydrochlorothiazide (MAXZIDE-25) 37.5-25 MG tablet    Sig: Take 1 tablet by mouth daily.    Dispense:  90 tablet    Refill:  1  . furosemide (LASIX) 20 MG tablet    Sig: Take 1 tablet (20 mg total) by mouth daily as needed (WT gain>3# in 24 HR).    Dispense:  90 tablet    Refill:  1  . amLODipine (NORVASC) 10 MG tablet    Sig: Take 1 tablet (10 mg total) by mouth daily.    Dispense:  90 tablet    Refill:  1  . DISCONTD: potassium chloride SA (KLOR-CON M20) 20 MEQ tablet    Sig: Take 1 tablet (20 mEq total) by mouth daily.    Dispense:  30 tablet    Refill:  5  . pantoprazole (PROTONIX) 40 MG tablet    Sig: Take 1 tablet (40 mg total) by mouth 2 (two) times daily.    Dispense:  180 tablet    Refill:  1  . dicyclomine (BENTYL) 20 MG  tablet    Sig: Take 1 tablet (20 mg total) by mouth 3 (three) times daily as needed for spasms.    Dispense:  60 tablet    Refill:  1  . sucralfate (CARAFATE) 1 g tablet    Sig: Take 1 tablet (1 g total) by mouth 4 (four) times daily -  with meals and at bedtime. prn    Dispense:  120 tablet    Refill:  3  . escitalopram (LEXAPRO) 10 MG tablet    Sig: Take 1 tablet (10 mg total) by mouth daily.    Dispense:  30 tablet    Refill:  3    CMA served as scribe during this visit. History, Physical and Plan performed by medical provider. Documentation and orders reviewed and attested to.  Penni Homans, MD

## 2017-07-27 NOTE — Assessment & Plan Note (Signed)
Moving bowels daily but small amounts. No blood or tarry stool. Increasing pain. Order abdominal xray and labs, she sees gastroenterology in [facility] tomorrow. She will let us know if no resolution patient call for further imaging to further investigate.

## 2017-07-27 NOTE — Assessment & Plan Note (Signed)
Encouraged heart healthy diet, increase exercise, avoid trans fats, consider a krill oil cap daily 

## 2017-07-27 NOTE — Patient Instructions (Signed)

## 2017-07-28 ENCOUNTER — Telehealth: Payer: Self-pay

## 2017-07-28 ENCOUNTER — Other Ambulatory Visit (INDEPENDENT_AMBULATORY_CARE_PROVIDER_SITE_OTHER): Payer: Medicaid Other

## 2017-07-28 ENCOUNTER — Telehealth: Payer: Self-pay | Admitting: Family Medicine

## 2017-07-28 DIAGNOSIS — R7989 Other specified abnormal findings of blood chemistry: Secondary | ICD-10-CM

## 2017-07-28 DIAGNOSIS — K2100 Gastro-esophageal reflux disease with esophagitis, without bleeding: Secondary | ICD-10-CM

## 2017-07-28 DIAGNOSIS — R109 Unspecified abdominal pain: Secondary | ICD-10-CM

## 2017-07-28 LAB — URINALYSIS, ROUTINE W REFLEX MICROSCOPIC
BILIRUBIN URINE: NEGATIVE
NITRITE: NEGATIVE
Total Protein, Urine: NEGATIVE
URINE GLUCOSE: NEGATIVE
UROBILINOGEN UA: 0.2 (ref 0.0–1.0)
pH: 6 (ref 5.0–8.0)

## 2017-07-28 LAB — URINE CULTURE
MICRO NUMBER:: 81029910
SPECIMEN QUALITY:: ADEQUATE

## 2017-07-28 MED ORDER — POTASSIUM CHLORIDE CRYS ER 20 MEQ PO TBCR: 20.0000 meq | EXTENDED_RELEASE_TABLET | Freq: Every day | ORAL | 1 refills | Status: DC

## 2017-07-28 MED ORDER — METRONIDAZOLE 500 MG PO TABS: 500.0000 mg | ORAL_TABLET | Freq: Three times a day (TID) | ORAL | 0 refills | Status: AC

## 2017-07-28 MED ORDER — CIPROFLOXACIN HCL 500 MG PO TABS: 500.0000 mg | ORAL_TABLET | Freq: Two times a day (BID) | ORAL | 0 refills | Status: AC

## 2017-07-28 NOTE — Telephone Encounter (Signed)
I have discussed labs with patient/she is now scheduled for HIV lab draw at 10:30am today and order has been placed/Klor-Con, Cipro, and Flagyl have been sent to [facility] as pt requested/she will advise if abd pain does not decrease then will proceed with CT Abd as stated by SB/thx dmf

## 2017-07-28 NOTE — Telephone Encounter (Signed)
Patient in need of clarity regarding medication mentioned below direction, best # [phone]

## 2017-07-28 NOTE — Telephone Encounter (Signed)
Pt returned call for lab results.

## 2017-07-28 NOTE — Telephone Encounter (Signed)
-----   Message from Mosie Lukes, MD sent at 07/27/2017  9:53 PM EDT ----- Notify potassium down some needs to take KCL 20 meq tabs, 1 tab daily. Disp #30 with 1 rf. Also wbc still down. Needs an HIV test and finally sed rate very high. We should start her on Ciprofloxacin 500 mg po bid and flagyl 500 mg po tid both x 7 days for possible diverticulitis. If her pain is worsening we need to proceed with ct scan of abd

## 2017-07-28 NOTE — Telephone Encounter (Signed)
SB-I went over instructions with patient/She expressed understanding/GI doc said that he wants to do an Endoscopy on Friday at the hospital/this is FYI/thx dmf

## 2017-07-29 LAB — HIV ANTIBODY (ROUTINE TESTING W REFLEX): HIV: NONREACTIVE

## 2017-07-31 ENCOUNTER — Ambulatory Visit (HOSPITAL_BASED_OUTPATIENT_CLINIC_OR_DEPARTMENT_OTHER): Payer: Medicaid Other

## 2017-08-02 ENCOUNTER — Ambulatory Visit (HOSPITAL_BASED_OUTPATIENT_CLINIC_OR_DEPARTMENT_OTHER): Payer: Medicaid Other

## 2017-08-02 ENCOUNTER — Telehealth: Payer: Self-pay | Admitting: Family Medicine

## 2017-08-02 NOTE — Telephone Encounter (Signed)
Relation to CH:JSCB Call back number: [phone]   Reason for call:  Patient states ciprofloxacin (CIPRO) 500 MG tablet and metroNIDAZOLE (FLAGYL) 500 MG tablet is causing her to feel extremely tired, fatigue whereas she wants to sleep for 2 hours, patient states she didn't stop the medication in need of clinical advice

## 2017-08-03 NOTE — Telephone Encounter (Signed)
Patient called back stating she would like to know if PCP can call her after hours due to her concerns, please advise patient directly

## 2017-08-03 NOTE — Telephone Encounter (Signed)
Patient calling back checking on the status of message below.  °

## 2017-08-03 NOTE — Telephone Encounter (Signed)
Called patient, no answer left message for patient to call back.   According to her chart she should be done with the medication that she is currently taking. She was only supposed to take medication for 7 days.   Pc

## 2017-08-04 NOTE — Telephone Encounter (Signed)
Pt states that she was at the ED and was given IV Rocephin/today is the last day of her cipro & flagyl as she held the oral meds the day that she was given the IV abx per the ED then resumed therapy the following day/thx dmf

## 2017-08-04 NOTE — Telephone Encounter (Signed)
Patient returned call stating she was in church and would like nurse to follow up with her tomorrow when in office, please advise

## 2017-08-04 NOTE — Telephone Encounter (Signed)
LMOVM stating that according to chart should complete course today/thx dmf

## 2017-08-09 ENCOUNTER — Encounter (HOSPITAL_BASED_OUTPATIENT_CLINIC_OR_DEPARTMENT_OTHER): Payer: Self-pay | Admitting: Emergency Medicine

## 2017-08-09 ENCOUNTER — Emergency Department (HOSPITAL_BASED_OUTPATIENT_CLINIC_OR_DEPARTMENT_OTHER)
Admission: EM | Admit: 2017-08-09 | Discharge: 2017-08-09 | Disposition: A | Discharge status: Home / Self Care | Payer: Medicaid Other | Attending: Emergency Medicine | Admitting: Emergency Medicine

## 2017-08-09 DIAGNOSIS — M545 Low back pain: Secondary | ICD-10-CM | POA: Insufficient documentation

## 2017-08-09 DIAGNOSIS — Z79899 Other long term (current) drug therapy: Secondary | ICD-10-CM | POA: Insufficient documentation

## 2017-08-09 DIAGNOSIS — Z8673 Personal history of transient ischemic attack (TIA), and cerebral infarction without residual deficits: Secondary | ICD-10-CM | POA: Diagnosis not present

## 2017-08-09 DIAGNOSIS — R51 Headache: Secondary | ICD-10-CM | POA: Insufficient documentation

## 2017-08-09 DIAGNOSIS — H9201 Otalgia, right ear: Secondary | ICD-10-CM | POA: Diagnosis not present

## 2017-08-09 DIAGNOSIS — R519 Headache, unspecified: Secondary | ICD-10-CM

## 2017-08-09 DIAGNOSIS — R42 Dizziness and giddiness: Secondary | ICD-10-CM | POA: Diagnosis not present

## 2017-08-09 DIAGNOSIS — I509 Heart failure, unspecified: Secondary | ICD-10-CM | POA: Diagnosis not present

## 2017-08-09 DIAGNOSIS — R1010 Upper abdominal pain, unspecified: Secondary | ICD-10-CM | POA: Diagnosis not present

## 2017-08-09 DIAGNOSIS — G8929 Other chronic pain: Secondary | ICD-10-CM | POA: Diagnosis not present

## 2017-08-09 DIAGNOSIS — R109 Unspecified abdominal pain: Secondary | ICD-10-CM

## 2017-08-09 DIAGNOSIS — I110 Hypertensive heart disease with heart failure: Secondary | ICD-10-CM | POA: Diagnosis not present

## 2017-08-09 MED ORDER — METOCLOPRAMIDE HCL 5 MG/ML IJ SOLN
10.0000 mg | Freq: Once | INTRAMUSCULAR | Status: AC
  Administered 2017-08-09: 10 mg via INTRAMUSCULAR
  Filled 2017-08-09: qty 2

## 2017-08-09 MED ORDER — DIPHENHYDRAMINE HCL 25 MG PO CAPS
25.0000 mg | ORAL_CAPSULE | Freq: Once | ORAL | Status: AC
  Administered 2017-08-09: 25 mg via ORAL
  Filled 2017-08-09: qty 1

## 2017-08-09 NOTE — ED Provider Notes (Signed)
[facility] DEPT [facility] Provider Note   CSN: [id number] Arrival date & time: 08/09/17  1546     History   Chief Complaint Chief Complaint  Patient presents with  . Headache    HPI Tami [name] is a 61 y.o. female.  Patient with history of congestive heart failure with pacemaker, chronic back pain, TIA, bowel obstruction, chronic abdominal pain -- presents with multiple complaints. She reports that for the past 3 days she has had frontal headache with associated fullness in her right ear. This has caused her to be dizzy which she describes as a spinning sensation or like things in the room are moving. This is better when she is still and worse when she is moving. She states that 2 years ago she had the same symptoms and went to a clinic and was given "a shot to get the drainage started". She has had some sinus congestion as well. No fevers or dental pain. No neck pain, vision changes, weakness in her arms or legs. She states that she has been having difficulty sleeping at night. She has been dealing with nausea and vomiting as well as lower back pain, all again for about 3 days. No falls or injuries. No chest pain or shortness of breath. No diarrhea. She is having normal bowel movements. No urinary symptoms. Patient denies signs of stroke including: facial droop, slurred speech, aphasia, weakness/numbness in extremities, imbalance/trouble walking.  Patient did not volunteer this information until I asked, however she was seen at [facility] this morning for the same set of complaints. She had a CT scan of her head, chest x-ray, abdominal labs which were reassuring. Documented normal neuro exam there. She was given meclizine.       Past Medical History:  Diagnosis Date  . Abnormal SPEP 07/26/2016  . Arthritis    knees, hands  . Back pain 07/14/2016  . Bowel obstruction ([place]) 11/2014  . CHF (congestive heart failure) ([place])   . Congestive heart failure ([place])  02/24/2015   S/p pacemaker  . Depression   . Epigastric pain 03/22/2017  . GERD (gastroesophageal reflux disease)   . Hyperlipidemia   . Hypertension   . Low back pain 07/14/2016  . 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 Myalgia 12/31/2016  . Obstructive sleep apnea 03/31/2015  . SOB (shortness of breath) 04/09/2016  . Stroke [facility])    TIAs    Patient Active Problem List   Diagnosis Date Noted  . 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 Epigastric pain 03/22/2017  . Myalgia 12/31/2016  . 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 SOB (shortness of breath) 07/26/2016  . Abnormal SPEP 07/26/2016  . Low back pain 07/14/2016  . Pharyngitis 04/19/2016  . Insomnia 12/29/2015  . Tension headache 10/21/2015  . Muscle spasms of neck 10/18/2015  . Dysuria 10/13/2015  . Sinusitis 07/07/2015  . AP (abdominal pain) 07/07/2015  . Cardiomyopathy ([place]) 04/19/2015  . Chest wall pain 04/19/2015  . Obstructive sleep apnea 03/31/2015  . Congestive heart failure ([place]) 02/24/2015  . Hyperlipidemia   . Hypertension   . Depression   . Stroke ([place])   . GERD (gastroesophageal reflux disease)   . Bowel obstruction ([place]) 11/09/2014    Past Surgical History:  Procedure Laterality Date  . ABDOMINAL HYSTERECTOMY     menorraghia, 2006, total  . CHOLECYSTECTOMY    . KNEE SURGERY     right, repair torn torn cartialage  . PACEMAKER INSERTION  08/2014  . TONSILLECTOMY    . TUBAL LIGATION      OB History    No data available       Home Medications    Prior to Admission medications   Medication Sig Start Date End Date Taking? Authorizing Provider  acetaminophen (TYLENOL) 325 MG tablet Take 650 mg by mouth. 06/07/17   [provider]  albuterol (PROVENTIL HFA;VENTOLIN HFA) 108 (90 Base) MCG/ACT inhaler Inhale 2 puffs into the lungs every 6 (six) hours as needed for wheezing or shortness of breath. 04/09/16   Mosie Lukes, MD  amLODipine  (NORVASC) 10 MG tablet Take 1 tablet (10 mg total) by mouth daily. 07/27/17 07/27/18  Mosie Lukes, MD  baclofen (LIORESAL) 20 MG tablet Take 1 tablet (20 mg total) by mouth 3 (three) times daily as needed for muscle spasms. 07/14/16   Mosie Lukes, MD  carvedilol (COREG) 25 MG tablet Take 1 tablet (25 mg total) by mouth 2 (two) times daily with a meal. 07/27/17   Mosie Lukes, MD  Cholecalciferol (VITAMIN D3) 5000 UNITS CAPS Take 1 capsule by mouth daily.    [provider]  conjugated estrogens (PREMARIN) vaginal cream Place vaginally. 01/31/14   [provider]  dicyclomine (BENTYL) 20 MG tablet Take 1 tablet (20 mg total) by mouth 3 (three) times daily as needed for spasms. 07/27/17   Mosie Lukes, MD  docusate sodium (COLACE) 50 MG capsule Take 50 mg by mouth 2 (two) times daily.    [provider]  ergocalciferol (VITAMIN D2) 50000 units capsule Take 1 capsule (50,000 Units total) by mouth once a week. 04/09/16   Mosie Lukes, MD  escitalopram (LEXAPRO) 10 MG tablet Take 1 tablet (10 mg total) by mouth daily. 07/27/17   Mosie Lukes, MD  famotidine (PEPCID) 40 MG tablet Take 1 tablet (40 mg total) by mouth at bedtime. 06/11/17   Mosie Lukes, MD  furosemide (LASIX) 20 MG tablet Take 1 tablet (20 mg total) by mouth daily as needed (WT gain>3# in 24 HR). 07/27/17   Mosie Lukes, MD  gabapentin (NEURONTIN) 300 MG capsule Take 1 capsule (300 mg total) by mouth 3 (three) times daily. 07/14/16   Mosie Lukes, MD  hyoscyamine (LEVSIN SL) 0.125 MG SL tablet Place 1 tablet (0.125 mg total) under the tongue every 4 (four) hours as needed. 06/11/17   Mosie Lukes, MD  metoCLOPramide (REGLAN) 10 MG tablet Take 1 tablet (10 mg total) by mouth 4 (four) times daily -  before meals and at bedtime. 06/11/17   [name], MD  modafinil (PROVIGIL) 100 MG tablet Take 1 tablet (100 mg total) by mouth daily. 10/11/15   Mosie Lukes, MD  Multiple Vitamins-Minerals (CENTRUM  SILVER ADULT 50+ PO) Take by mouth.    [provider]  pantoprazole (PROTONIX) 40 MG tablet Take 1 tablet (40 mg total) by mouth 2 (two) times daily. 07/27/17   Mosie Lukes, MD  potassium chloride SA (KLOR-CON M20) 20 MEQ tablet Take 1 tablet (20 mEq total) by mouth daily. 07/28/17   Mosie Lukes, MD  silver sulfADIAZINE (SILVADENE) 1 % cream Apply 1 application topically daily. 04/15/16   Mosie Lukes, MD  sucralfate (CARAFATE) 1 g tablet Take 1 tablet (1 g total) by mouth 4 (four) times daily -  with meals and at bedtime. prn 07/27/17   Mosie Lukes, MD  tiZANidine (ZANAFLEX) 4 MG tablet  Take 0.5-1 tablets (2-4 mg total) by mouth at bedtime as needed for muscle spasms. 03/22/17   Mosie Lukes, MD  triamterene-hydrochlorothiazide (MAXZIDE-25) 37.5-25 MG tablet Take 1 tablet by mouth daily. 07/27/17   Mosie Lukes, MD  zolpidem (AMBIEN) 10 MG tablet Take 0.5-1 tablets (5-10 mg total) by mouth at bedtime as needed for sleep. 12/23/15 01/22/16  Mosie Lukes, MD    Family History Family History  Problem Relation Age of Onset  . Diabetes Mother   . Hypertension Mother   . Heart disease Mother        s/p 1 stent  . Hyperlipidemia Mother   . Arthritis Mother   . Cancer Father        COLON  . Colon cancer Father 32  . Irritable bowel syndrome Sister   . Hyperlipidemia Daughter   . Hypertension Daughter   . Hypertension Maternal Grandmother   . Arthritis Maternal Grandmother   . Heart disease Maternal Grandfather        MI  . Hypertension Maternal Grandfather   . Arthritis Maternal Grandfather   . Hypertension Son     Social History Social History  Substance Use Topics  . Smoking status: Never Smoker  . Smokeless tobacco: Never Used  . Alcohol use No     Allergies   Benazepril; Codeine; Morphine; and Ondansetron hcl   Review of Systems Review of Systems  Constitutional: Negative for fever.  HENT: Positive for congestion. Negative for dental problem,  rhinorrhea and sinus pressure.   Eyes: Negative for photophobia, discharge, redness and visual disturbance.  Respiratory: Negative for shortness of breath.   Cardiovascular: Negative for chest pain.  Gastrointestinal: Positive for abdominal pain (epigastric), nausea and vomiting. Negative for constipation and diarrhea.  Musculoskeletal: Positive for back pain. Negative for gait problem, neck pain and neck stiffness.  Skin: Negative for rash.  Neurological: Positive for headaches. Negative for syncope, speech difficulty, weakness, light-headedness and numbness.  Psychiatric/Behavioral: Negative for confusion.     Physical Exam Updated Vital Signs BP (!) 147/100 (BP Location: Right Arm)   Pulse (!) 106   Temp 98.1 F (36.7 C) (Oral)   Resp 18   Ht 5\' 6"  (1.676 m)   Wt 78 kg (172 lb)   SpO2 97%   BMI 27.76 kg/m   Physical Exam  Constitutional: She is oriented to person, place, and time. She appears well-developed and well-nourished.  HENT:  Head: Normocephalic and atraumatic.  Right Ear: Tympanic membrane, external ear and ear canal normal.  Left Ear: Tympanic membrane, external ear and ear canal normal.  Nose: Nose normal.  Mouth/Throat: Uvula is midline, oropharynx is clear and moist and mucous membranes are normal.  Bilateral TMs are normal.  Eyes: Pupils are equal, round, and reactive to light. Conjunctivae, EOM and lids are normal. Right eye exhibits no nystagmus. Left eye exhibits no nystagmus.  No nystagmus.   Neck: Normal range of motion. Neck supple.  Cardiovascular: Normal rate and regular rhythm.   No murmur heard. Pulmonary/Chest: Effort normal and breath sounds normal. No respiratory distress. She has no wheezes.  Abdominal: Soft. There is no tenderness. There is no rebound and no guarding.  Musculoskeletal:       Cervical back: She exhibits normal range of motion, no tenderness and no bony tenderness.  Neurological: She is alert and oriented to person, place, and  time. She has normal strength and normal reflexes. No cranial nerve deficit or sensory deficit. She displays a negative  Romberg sign. Coordination and gait normal. GCS eye subscore is 4. GCS verbal subscore is 5. GCS motor subscore is 6.  Patient ambulatory without difficulty.   Skin: Skin is warm and dry.  Psychiatric: She has a normal mood and affect.  Nursing note and vitals reviewed.    ED Treatments / Results   Procedures Procedures (including critical care time)  Medications Ordered in ED Medications  metoCLOPramide (REGLAN) injection 10 mg (10 mg Intramuscular Given 08/09/17 1632)  diphenhydrAMINE (BENADRYL) capsule 25 mg (25 mg Oral Given 08/09/17 1632)     Initial Impression / Assessment and Plan / ED Course  I have reviewed the triage vital signs and the nursing notes.  Pertinent labs & imaging results that were available during my care of the patient were reviewed by me and considered in my medical decision making (see chart for details).     Patient seen and examined. Reviewed recent tests from [facility].   Vital signs reviewed and are as follows: BP (!) 147/100 (BP Location: Right Arm)   Pulse (!) 106   Temp 98.1 F (36.7 C) (Oral)   Resp 18   Ht 5\' 6"  (1.676 m)   Wt 78 kg (172 lb)   SpO2 97%   BMI 27.76 kg/m   Patient essentially has a normal exam here. Patient is not clearly verbalize what she would like today, outside of getting a shot to help her ear drain. I have low suspicion for CVA today. Patient does not have any focal neurological deficits. She is able to sit up in bed, stand, and ambulate without any difficulty or assistance. Normal coordination and finger-to-nose testing.  Offered IM Reglan and PO Benadryl. Patient states that her son will come to pick her up. Encouraged close follow-up with primary care physician. Encouraged her to try Reglan to treat her vertigo type dizziness. Patient examined history is not very suggestive of posterior  circulation stroke and I do not feel the patient requires transfer for MRI today. There are no objective findings to support this.   Low concern for significant intra-abdominal pathology.   Patient counseled to return if they have weakness in their arms or legs, slurred speech, trouble walking or talking, confusion, trouble with their balance, or if they have any other concerns. Patient verbalizes understanding and agrees with plan.    Final Clinical Impressions(s) / ED Diagnoses   Final diagnoses:  Acute nonintractable headache, unspecified headache type  Right ear pain  Vertigo  Chronic abdominal pain  Chronic low back pain, unspecified back pain laterality, with sciatica presence unspecified   HA/dizziness/ear pain: Frontal HA with R ear pain but normal ENT and complete neuro exam. CT performed earlier today negative. Patient without high-risk features of headache including: sudden onset/thunderclap HA, no similar headache in past, altered mental status, accompanying seizure, headache with exertion, history of immunocompromise, neck or shoulder pain, fever, use of anticoagulation, family history of spontaneous SAH, concomitant drug use, toxic exposure.   Patient has a normal complete neurological exam, normal vital signs, normal level of consciousness, no signs of meningismus, is well-appearing/non-toxic appearing, no signs of trauma, no pain over the temporal arteries.   No dangerous or life-threatening conditions suspected or identified by history, physical exam, and by work-up. No indications for hospitalization identified.   Abdominal pain: Mild upper abd tenderness. Reviewed labs from earlier today. Patient is not actively vomiting. No concern for acute intra-abdominal pathology.  Chronic back pain: No red flags. This is not patient's primary  complaint. Symptoms are ongoing. Continue care as prescribed by primary care.   New Prescriptions New Prescriptions   No medications on  file     Carlisle Cater, Hershal Coria 08/09/17 1700    Long, Wonda [facility], MD 08/10/17 1313

## 2017-08-09 NOTE — Discharge Instructions (Signed)
Please read and follow all provided instructions.  Your diagnoses today include:  1. Acute nonintractable headache, unspecified headache type   2. Right ear pain   3. Vertigo   4. Chronic abdominal pain   5. Chronic low back pain, unspecified back pain laterality, with sciatica presence unspecified    Tests performed today include:  Vital signs. See below for your results today.   Medications prescribed:   None  Take any prescribed medications only as directed.  Home care instructions:  Follow any educational materials contained in this packet.  Rest tonight and use meclizine as prescribed.   Follow-up instructions: Please follow-up with your primary care provider in the next 3 days for further evaluation of your symptoms.   Return instructions:  SEEK IMMEDIATE MEDICAL ATTENTION IF:  Return if you have weakness in your arms or legs, slurred speech, trouble walking or talking, confusion, or trouble with your balance.   You have more than one episode of vomiting.   You notice dizziness or unsteadiness which is getting worse, or inability to walk.   You have convulsions or unconsciousness.   You experience severe, persistent headaches not relieved by Tylenol.  You cannot use arms or legs normally.   You have change in speech, vision, swallowing, or understanding.   Localized weakness, numbness, tingling, or change in bowel or bladder control.  You have any other emergent concerns.  Your vital signs today were: BP (!) 147/100 (BP Location: Right Arm)    Pulse (!) 106    Temp 98.1 F (36.7 C) (Oral)    Resp 18    Ht 5\' 6"  (1.676 m)    Wt 78 kg (172 lb)    SpO2 97%    BMI 27.76 kg/m  If your blood pressure (BP) was elevated above 135/85 this visit, please have this repeated by your doctor within one month. --------------

## 2017-08-09 NOTE — ED Triage Notes (Addendum)
Patient states that she has had lower back pain, and Headache x 3 days. Reports that she has a fullness to her right ear and she is off balance. Patient reports that she is having blurred vision and drainage to her inner ear

## 2017-08-11 ENCOUNTER — Ambulatory Visit (HOSPITAL_BASED_OUTPATIENT_CLINIC_OR_DEPARTMENT_OTHER)
Admission: RE | Admit: 2017-08-11 | Discharge: 2017-08-11 | Disposition: A | Discharge status: Home / Self Care | Payer: Medicaid Other | Source: Ambulatory Visit | Attending: Family Medicine | Admitting: Family Medicine

## 2017-08-11 DIAGNOSIS — I7789 Other specified disorders of arteries and arterioles: Secondary | ICD-10-CM | POA: Diagnosis not present

## 2017-08-11 DIAGNOSIS — R109 Unspecified abdominal pain: Secondary | ICD-10-CM | POA: Insufficient documentation

## 2017-08-12 ENCOUNTER — Ambulatory Visit (INDEPENDENT_AMBULATORY_CARE_PROVIDER_SITE_OTHER): Payer: Medicaid Other | Admitting: Family Medicine

## 2017-08-12 ENCOUNTER — Ambulatory Visit: Payer: Self-pay | Admitting: Family Medicine

## 2017-08-12 ENCOUNTER — Encounter: Payer: Self-pay | Admitting: Family Medicine

## 2017-08-12 VITALS — BP 130/90 | HR 87 | Temp 97.7°F | Resp 18 | Wt 175.0 lb

## 2017-08-12 DIAGNOSIS — I10 Essential (primary) hypertension: Secondary | ICD-10-CM | POA: Diagnosis not present

## 2017-08-12 DIAGNOSIS — D472 Monoclonal gammopathy: Secondary | ICD-10-CM | POA: Diagnosis not present

## 2017-08-12 DIAGNOSIS — I7789 Other specified disorders of arteries and arterioles: Secondary | ICD-10-CM

## 2017-08-12 DIAGNOSIS — R700 Elevated erythrocyte sedimentation rate: Secondary | ICD-10-CM

## 2017-08-12 DIAGNOSIS — H539 Unspecified visual disturbance: Secondary | ICD-10-CM | POA: Diagnosis not present

## 2017-08-12 DIAGNOSIS — K56609 Unspecified intestinal obstruction, unspecified as to partial versus complete obstruction: Secondary | ICD-10-CM

## 2017-08-12 MED ORDER — FLUTICASONE PROPIONATE 50 MCG/ACT NA SUSP: 2.0000 | Freq: Every day | NASAL | 6 refills | Status: DC

## 2017-08-12 MED ORDER — ASPIRIN EC 81 MG PO TBEC: 81.0000 mg | DELAYED_RELEASE_TABLET | Freq: Every day | ORAL | Status: DC

## 2017-08-12 NOTE — Progress Notes (Signed)
Subjective:  I acted as a Education administrator for Dr. Charlett Blake. [place], [place]  [name], female    DOB: 04/27/1956, 61 y.o.   MRN: [id number]  No chief complaint on file.   HPI  Patient is in today for a 4 week follow up. She continues to struggle with recurrent abdominal discomfort but has not had to be hospitalized with a bowel obstruction since her last visit here. Her gastroenterologist is continuing to work with her. No bloody or tarry stool but some nausea persists. She continues to struggle with overwhelming fatigue, malasie and myalgias. Notes joint pain and generalized pain. Denies CP/palp/SOB/HA/congestion/fever or GU c/o. Taking meds as prescribed  Patient Care Team: Mosie Lukes, MD as PCP - General (Family Medicine)   Past Medical History:  Diagnosis Date  . Abnormal SPEP 07/26/2016  . Arthritis    knees, hands  . Back pain 07/14/2016  . Bowel obstruction ([place]) 11/2014  . CHF (congestive heart failure) ([place])   . Congestive heart failure ([place]) 02/24/2015   S/p pacemaker  . Depression   . Elevated sed rate 08/15/2017  . Epigastric pain 03/22/2017  . GERD (gastroesophageal reflux disease)   . Hyperlipidemia   . Hypertension   . Low back pain 07/14/2016  . 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 Myalgia 12/31/2016  . Obstructive sleep apnea 03/31/2015  . SOB (shortness of breath) 04/09/2016  . Stroke [facility])    TIAs    Past Surgical History:  Procedure Laterality Date  . ABDOMINAL HYSTERECTOMY     menorraghia, 2006, total  . CHOLECYSTECTOMY    . KNEE SURGERY     right, repair torn torn cartialage  . PACEMAKER INSERTION  08/2014  . TONSILLECTOMY    . TUBAL LIGATION      Family History  Problem Relation Age of Onset  . Diabetes Mother   . Hypertension Mother   . Heart disease Mother        s/p 1 stent  . Hyperlipidemia Mother   . Arthritis Mother   . Cancer Father        COLON  . Colon cancer Father 82  . Irritable bowel syndrome  Sister   . Hyperlipidemia Daughter   . Hypertension Daughter   . Hypertension Maternal Grandmother   . Arthritis Maternal Grandmother   . Heart disease Maternal Grandfather        MI  . Hypertension Maternal Grandfather   . Arthritis Maternal Grandfather   . Hypertension Son     Social History   Social History  . Marital status: Single    Spouse name: N/A  . Number of children: N/A  . Years of education: N/A   Occupational History  . [name].   Social History Main Topics  . Smoking status: Never Smoker  . Smokeless tobacco: Never Used  . Alcohol use No  . Drug use: No  . Sexual activity: No     Comment: Lives alone, no dietary restrictions   Other Topics Concern  . [name]   Social History Narrative  . No narrative on file    Outpatient Medications Prior to Visit  Medication Sig Dispense Refill  . acetaminophen (TYLENOL) 325 MG tablet Take 650 mg by mouth.    Marland Kitchen albuterol (PROVENTIL HFA;VENTOLIN HFA) 108 (90 Base) MCG/ACT inhaler Inhale 2 puffs into the lungs every 6 (six) hours as needed for wheezing or shortness of breath. 1 Inhaler 1  . amLODipine (NORVASC)  10 MG tablet Take 1 tablet (10 mg total) by mouth daily. 90 tablet 1  . baclofen (LIORESAL) 20 MG tablet Take 1 tablet (20 mg total) by mouth 3 (three) times daily as needed for muscle spasms. 90 each 2  . carvedilol (COREG) 25 MG tablet Take 1 tablet (25 mg total) by mouth 2 (two) times daily with a meal. 180 tablet 1  . Cholecalciferol (VITAMIN D3) 5000 UNITS CAPS Take 1 capsule by mouth daily.    Marland Kitchen conjugated estrogens (PREMARIN) vaginal cream Place vaginally.    . dicyclomine (BENTYL) 20 MG tablet Take 1 tablet (20 mg total) by mouth 3 (three) times daily as needed for spasms. 60 tablet 1  . docusate sodium (COLACE) 50 MG capsule Take 50 mg by mouth 2 (two) times daily.    . ergocalciferol (VITAMIN D2) 50000 units capsule Take 1 capsule (50,000 Units total) by mouth once a week. 12 capsule 1  .  escitalopram (LEXAPRO) 10 MG tablet Take 1 tablet (10 mg total) by mouth daily. 30 tablet 3  . famotidine (PEPCID) 40 MG tablet Take 1 tablet (40 mg total) by mouth at bedtime. 90 tablet 1  . furosemide (LASIX) 20 MG tablet Take 1 tablet (20 mg total) by mouth daily as needed (WT gain>3# in 24 HR). 90 tablet 1  . gabapentin (NEURONTIN) 300 MG capsule Take 1 capsule (300 mg total) by mouth 3 (three) times daily. 90 capsule 3  . hyoscyamine (LEVSIN SL) 0.125 MG SL tablet Place 1 tablet (0.125 mg total) under the tongue every 4 (four) hours as needed. 90 tablet 01  . metoCLOPramide (REGLAN) 10 MG tablet Take 1 tablet (10 mg total) by mouth 4 (four) times daily -  before meals and at bedtime. 60 tablet 4  . modafinil (PROVIGIL) 100 MG tablet Take 1 tablet (100 mg total) by mouth daily. 30 tablet 5  . Multiple Vitamins-Minerals (CENTRUM SILVER ADULT 50+ PO) Take by mouth.    . pantoprazole (PROTONIX) 40 MG tablet Take 1 tablet (40 mg total) by mouth 2 (two) times daily. 180 tablet 1  . potassium chloride SA (KLOR-CON M20) 20 MEQ tablet Take 1 tablet (20 mEq total) by mouth daily. 30 tablet 1  . silver sulfADIAZINE (SILVADENE) 1 % cream Apply 1 application topically daily. 50 g 1  . sucralfate (CARAFATE) 1 g tablet Take 1 tablet (1 g total) by mouth 4 (four) times daily -  with meals and at bedtime. prn 120 tablet 3  . tiZANidine (ZANAFLEX) 4 MG tablet Take 0.5-1 tablets (2-4 mg total) by mouth at bedtime as needed for muscle spasms. 30 tablet 1  . triamterene-hydrochlorothiazide (MAXZIDE-25) 37.5-25 MG tablet Take 1 tablet by mouth daily. 90 tablet 1  . zolpidem (AMBIEN) 10 MG tablet Take 0.5-1 tablets (5-10 mg total) by mouth at bedtime as needed for sleep. 30 tablet 1   No facility-administered medications prior to visit.     Allergies  Allergen Reactions  . Benazepril Anaphylaxis    angioedema  . Codeine Nausea And Vomiting  . Morphine     Redness and hives noted post IV admin on 07/05/17  .  Ondansetron Hcl     Redness and hives post IV admin on 07/05/17    Review of Systems  Constitutional: Positive for malaise/fatigue. Negative for fever.  HENT: Negative for congestion.   Eyes: Positive for blurred vision. Negative for double vision and photophobia.  Respiratory: Negative for cough and shortness of breath.   Cardiovascular:  Negative for chest pain, palpitations and leg swelling.  Gastrointestinal: Positive for abdominal pain and nausea. Negative for blood in stool, melena and vomiting.  Musculoskeletal: Positive for back pain and joint pain.  Skin: Negative for rash.  Neurological: Positive for weakness. Negative for loss of consciousness and headaches.       Objective:    Physical Exam  Constitutional: She is oriented to person, place, and time. She appears well-developed and well-nourished. No distress.  HENT:  Head: Normocephalic and atraumatic.  Eyes: Conjunctivae are normal.  Neck: Normal range of motion. No thyromegaly present.  Cardiovascular: Normal rate and regular rhythm.   Pulmonary/Chest: Effort normal and breath sounds normal. She has no wheezes.  Abdominal: Soft. Bowel sounds are normal. There is no tenderness.  Musculoskeletal: Normal range of motion. She exhibits no edema or deformity.  Neurological: She is alert and oriented to person, place, and time.  Skin: Skin is warm and dry. She is not diaphoretic.  Psychiatric: She has a normal mood and affect.    BP 130/90 (BP Location: Left Arm, Patient Position: Sitting, Cuff Size: Normal)   Pulse 87   Temp 97.7 F (36.5 C) (Oral)   Resp 18   Wt 175 lb (79.4 kg)   SpO2 97%   BMI 28.25 kg/m  Wt Readings from Last 3 Encounters:  08/12/17 175 lb (79.4 kg)  08/09/17 172 lb (78 kg)  07/27/17 174 lb 12.8 oz (79.3 kg)   BP Readings from Last 3 Encounters:  08/12/17 130/90  08/09/17 (!) 147/100  07/27/17 116/84     Immunization History  Administered Date(s) Administered  . Influenza Whole  09/09/2009, 09/12/2013, 07/12/2015  . Influenza,inj,Quad PF,6+ Mos 07/14/2016, 07/27/2017  . Influenza-Unspecified 08/23/2014, 07/06/2015  . Pneumococcal Conjugate-13 07/12/2015  . Pneumococcal Polysaccharide-23 09/06/2014, 07/12/2015  . Pneumococcal-Unspecified 07/06/2015  . Tdap 12/01/2010, 10/11/2015  . Zoster 10/11/2015, 09/15/2016    Health Maintenance  Topic Date Due  . MAMMOGRAM  12/23/2017  . PAP SMEAR  10/10/2018  . COLONOSCOPY  10/26/2019  . TETANUS/TDAP  10/10/2025  . INFLUENZA VACCINE  Completed  . Hepatitis C Screening  Completed  . HIV Screening  Completed    Lab Results  Component Value Date   WBC 2.5 (L) 07/27/2017   HGB 11.2 (L) 07/27/2017   HCT 34.9 (L) 07/27/2017   PLT 132.0 (L) 07/27/2017   GLUCOSE 117 (H) 07/27/2017   CHOL 163 12/31/2016   TRIG 65.0 12/31/2016   HDL 48.00 12/31/2016   LDLCALC 102 (H) 12/31/2016   ALT 10 07/27/2017   AST 17 07/27/2017   NA 138 07/27/2017   K 3.4 (L) 07/27/2017   CL 106 07/27/2017   CREATININE 0.66 07/27/2017   BUN 18 07/27/2017   CO2 27 07/27/2017   TSH 1.34 12/31/2016    Lab Results  Component Value Date   TSH 1.34 12/31/2016   Lab Results  Component Value Date   WBC 2.5 (L) 07/27/2017   HGB 11.2 (L) 07/27/2017   HCT 34.9 (L) 07/27/2017   MCV 88.5 07/27/2017   PLT 132.0 (L) 07/27/2017   Lab Results  Component Value Date   NA 138 07/27/2017   K 3.4 (L) 07/27/2017   CHLORIDE 108 12/31/2016   CO2 27 07/27/2017   GLUCOSE 117 (H) 07/27/2017   BUN 18 07/27/2017   CREATININE 0.66 07/27/2017   BILITOT 0.3 07/27/2017   ALKPHOS 43 07/27/2017   AST 17 07/27/2017   ALT 10 07/27/2017   PROT 9.4 (H) 07/27/2017  ALBUMIN 3.4 (L) 07/27/2017   CALCIUM 9.4 07/27/2017   ANIONGAP 9 12/31/2016   EGFR >90 12/31/2016   GFR 116.27 07/27/2017   Lab Results  Component Value Date   CHOL 163 12/31/2016   Lab Results  Component Value Date   HDL 48.00 12/31/2016   Lab Results  Component Value Date   LDLCALC  102 (H) 12/31/2016   Lab Results  Component Value Date   TRIG 65.0 12/31/2016   Lab Results  Component Value Date   CHOLHDL 3 12/31/2016   No results found for: HGBA1C       Assessment & Plan:   Problem List Items Addressed This Visit    Monoclonal gammopathy of unknown significance (MGUS) (Chronic)    Continues to follow with oncology and is stable.       Bowel obstruction (HCC)    No new flare or increased abdominal pain      Hypertension    Well controlled, no changes to meds. Encouraged heart healthy diet such as the DASH diet and exercise as tolerated.       Relevant Medications   aspirin EC 81 MG tablet   Enlarged aorta (HCC)    Aortic ultrasound shows no aneurysm.       Relevant Medications   aspirin EC 81 MG tablet   Elevated sed rate    Significantly elevated for unclear reasons, attempted to refer to rheumatology for consideration but due case was declined. We will repeat sed rate and she should add ECASA 81 md daily and fish oild, minimize carbs and increase fresh fruits and veg, lean proteins. Recheck next month with further lab work      Relevant Orders   Ambulatory referral to Rheumatology   Visual changes - Primary    She reports blurry vision recently referred to opthamology for evaluation      Relevant Orders   Ambulatory referral to Ophthalmology      I am having Ms. Sherburn start on fluticasone and aspirin EC. I am also having her maintain her docusate sodium, Vitamin D3, Multiple Vitamins-Minerals (CENTRUM SILVER ADULT 50+ PO), modafinil, zolpidem, ergocalciferol, albuterol, silver sulfADIAZINE, baclofen, gabapentin, tiZANidine, conjugated estrogens, acetaminophen, metoCLOPramide, hyoscyamine, famotidine, carvedilol, triamterene-hydrochlorothiazide, furosemide, amLODipine, pantoprazole, dicyclomine, sucralfate, escitalopram, and potassium chloride SA.  Meds ordered this encounter  Medications  . fluticasone (FLONASE) 50 MCG/ACT nasal spray     Sig: Place 2 sprays into both nostrils daily.    Dispense:  16 g    Refill:  6  . aspirin EC 81 MG tablet    Sig: Take 1 tablet (81 mg total) by mouth daily.    CMA served as Education administrator during this visit. History, Physical and Plan performed by medical provider. Documentation and orders reviewed and attested to.  Penni Homans, MD

## 2017-08-12 NOTE — Patient Instructions (Signed)
Erythrocyte Sedimentation Rate Why am I having this test? The erythrocyte sedimentation rate (ESR) test is used to find illnesses associated with:  Acute and chronic infection.  Inflammation.  Noncancerous abnormal tissue growth.  Tissue death.  This test measures how long it takes for your red blood cells to settle in a saline or plasma solution over a specified amount of time. If you have vague symptoms associated with these illnesses, your health care provider may do this test before doing more specific tests. This test can also be used to help monitor therapy if you have an inflammatory immune disease, such as rheumatoid arthritis. What kind of sample is taken? A blood sample is required for this test. It is usually collected by inserting a needle into a vein. How do I prepare for this test? There is no preparation required for this test. Your health care provider will tell you if you need to stop any of your medicines before the test. Some medicines can affect the test results. What are the reference values? Reference values are considered healthy values established after testing a large group of healthy people. Reference values may vary among different people, labs, and hospitals. It is your responsibility to obtain your test results. Ask the lab or department performing the test when and how you will get your results. Reference values using the Westergren method are the following:  Female: up to 15 mm/hr.  Female:up to 20 mm/hr.  Child: up to 10 mm/hr.  Newborn: 0-2 mm/hr.  The ESR results depend on which test the lab uses. What do the results mean? Increased ESR levels may indicate:  Kidney failure.  Certain types of cancer.  Inflammatory diseases.  Tissue death or damage.  Severe anemia.  Certain diseases may cause ESR levels to be falsely decreased. Talk with your health care provider to discuss your results, treatment options, and if necessary, the need for more  tests. Talk with your health care provider if you have any questions about your results. Talk with your health care provider to discuss your results, treatment options, and if necessary, the need for more tests. Talk with your health care provider if you have any questions about your results. This information is not intended to replace advice given to you by your health care provider. Make sure you discuss any questions you have with your health care provider. Document Released: 11/18/2004 Document Revised: 06/30/2016 Document Reviewed: 03/23/2014 Elsevier Interactive Patient Education  2018 [employer] Inc.  

## 2017-08-15 ENCOUNTER — Encounter: Payer: Self-pay | Admitting: Family Medicine

## 2017-08-15 DIAGNOSIS — R700 Elevated erythrocyte sedimentation rate: Secondary | ICD-10-CM

## 2017-08-15 DIAGNOSIS — G43109 Migraine with aura, not intractable, without status migrainosus: Secondary | ICD-10-CM | POA: Insufficient documentation

## 2017-08-15 HISTORY — DX: Elevated erythrocyte sedimentation rate: R70.0

## 2017-08-15 NOTE — Assessment & Plan Note (Signed)
No new flare or increased abdominal pain

## 2017-08-15 NOTE — Assessment & Plan Note (Signed)
She reports blurry vision recently referred to opthamology for evaluation

## 2017-08-15 NOTE — Assessment & Plan Note (Signed)
Continues to follow with oncology and is stable.

## 2017-08-15 NOTE — Assessment & Plan Note (Signed)
Aortic ultrasound shows no aneurysm.

## 2017-08-15 NOTE — Assessment & Plan Note (Signed)
Significantly elevated for unclear reasons, attempted to refer to rheumatology for consideration but due case was declined. We will repeat sed rate and she should add ECASA 81 md daily and fish oild, minimize carbs and increase fresh fruits and veg, lean proteins. Recheck next month with further lab work

## 2017-08-15 NOTE — Assessment & Plan Note (Signed)
Well controlled, no changes to meds. Encouraged heart healthy diet such as the DASH diet and exercise as tolerated.  °

## 2017-08-16 ENCOUNTER — Other Ambulatory Visit: Payer: Self-pay

## 2017-08-16 MED ORDER — ASPIRIN EC 81 MG PO TBEC: 81.0000 mg | DELAYED_RELEASE_TABLET | Freq: Every day | ORAL | 1 refills | Status: DC

## 2017-08-17 ENCOUNTER — Other Ambulatory Visit: Payer: Self-pay

## 2017-08-17 MED ORDER — ASPIRIN EC 81 MG PO TBEC: 81.0000 mg | DELAYED_RELEASE_TABLET | Freq: Every day | ORAL | 1 refills | Status: DC

## 2017-08-18 ENCOUNTER — Telehealth: Payer: Self-pay | Admitting: Family Medicine

## 2017-08-18 NOTE — Telephone Encounter (Signed)
Tami [name] Self [phone]  Kyliee called to say she had seen her Eye doctor this morning and they said she does need glasses, but she also has a bad case of migraines. She also stated that she had seen a specialist years ago for migraines and they gave her something that started with a T. She is going to have pharmacy fax a request for this medicine, they had the name of it from back in 2011. The eye doctor is also going to send over their report. I let her know that it would be good to discuss this on her OV on Friday.

## 2017-08-20 ENCOUNTER — Ambulatory Visit (INDEPENDENT_AMBULATORY_CARE_PROVIDER_SITE_OTHER): Payer: Medicaid Other | Admitting: Family Medicine

## 2017-08-20 ENCOUNTER — Encounter: Payer: Self-pay | Admitting: Family Medicine

## 2017-08-20 VITALS — BP 122/82 | HR 90 | Temp 97.9°F | Resp 18 | Wt 172.2 lb

## 2017-08-20 DIAGNOSIS — G43109 Migraine with aura, not intractable, without status migrainosus: Secondary | ICD-10-CM

## 2017-08-20 DIAGNOSIS — R1013 Epigastric pain: Secondary | ICD-10-CM

## 2017-08-20 DIAGNOSIS — E876 Hypokalemia: Secondary | ICD-10-CM | POA: Diagnosis not present

## 2017-08-20 DIAGNOSIS — D649 Anemia, unspecified: Secondary | ICD-10-CM | POA: Diagnosis not present

## 2017-08-20 DIAGNOSIS — I10 Essential (primary) hypertension: Secondary | ICD-10-CM | POA: Diagnosis not present

## 2017-08-20 LAB — COMPREHENSIVE METABOLIC PANEL
ALK PHOS: 37 U/L — AB (ref 39–117)
ALT: 23 U/L (ref 0–35)
AST: 44 U/L — ABNORMAL HIGH (ref 0–37)
Albumin: 3.5 g/dL (ref 3.5–5.2)
BUN: 14 mg/dL (ref 6–23)
CO2: 28 meq/L (ref 19–32)
Calcium: 8.8 mg/dL (ref 8.4–10.5)
Chloride: 103 mEq/L (ref 96–112)
Creatinine, Ser: 0.67 mg/dL (ref 0.40–1.20)
GFR: 114.24 mL/min (ref >60.00)
GLUCOSE: 95 mg/dL (ref 70–99)
POTASSIUM: 3.6 meq/L (ref 3.5–5.1)
SODIUM: 138 meq/L (ref 135–145)
TOTAL PROTEIN: 9.5 g/dL — AB (ref 6.0–8.3)
Total Bilirubin: 0.4 mg/dL (ref 0.2–1.2)

## 2017-08-20 LAB — CBC
HCT: 34.7 % — ABNORMAL LOW (ref 36.0–46.0)
HEMOGLOBIN: 11.4 g/dL — AB (ref 12.0–15.0)
MCHC: 32.8 g/dL (ref 30.0–36.0)
MCV: 88 fl (ref 78.0–100.0)
Platelets: 156 10*3/uL (ref 150.0–400.0)
RBC: 3.95 Mil/uL (ref 3.87–5.11)
RDW: 14.9 % (ref 11.5–15.5)
WBC: 3.6 10*3/uL — ABNORMAL LOW (ref 4.0–10.5)

## 2017-08-20 MED ORDER — TOPIRAMATE 25 MG PO TABS: 25.0000 mg | ORAL_TABLET | Freq: Two times a day (BID) | ORAL | 2 refills | Status: DC

## 2017-08-20 NOTE — Assessment & Plan Note (Signed)
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 Has used Topamax in past with good results will restart at Topamax 25 po bid

## 2017-08-20 NOTE — Assessment & Plan Note (Signed)
Well controlled, no changes to meds. Encouraged heart healthy diet such as the DASH diet and exercise as tolerated.  °

## 2017-08-20 NOTE — Patient Instructions (Signed)
Blurred Vision Having blurred vision means that you cannot see things clearly. Your vision may seem fuzzy or out of focus. Blurred vision is a very common symptom of an eye or vision problem. Blurred vision is often a gradual blur that occurs in one eye or both eyes. There are many causes of blurred vision, including cataracts, macular degeneration, and diabetic retinopathy. Blurred vision can be diagnosed based on your symptoms and a physical exam. Tell your health care provider about any other health problems you have, any recent eye injury, and any prior surgeries. You may need to see a health care provider who specializes in eye problems (ophthalmologist). Your treatment depends on what is causing your blurred vision.  HOME CARE INSTRUCTIONS  Tell your health care provider about any changes in your blurred vision.  Do not drive or operate heavy machinery if your vision is blurry.  Keep all follow-up visits as directed by your health care provider. This is important. SEEK MEDICAL CARE IF:  Your symptoms get worse.  You have new symptoms.  You have trouble seeing at night.  You have trouble seeing up close or far away.  You have trouble noticing the difference between colors. SEEK IMMEDIATE MEDICAL CARE IF:  You have severe eye pain.  You have a severe headache.  You have flashing lights in your field of vision.  You have a sudden change in vision.  You have a sudden loss of vision.  You have vision change after an injury.  You notice drainage coming from your eyes.  You notice a rash around your eyes. This information is not intended to replace advice given to you by your health care provider. Make sure you discuss any questions you have with your health care provider. Document Released: 10/29/2003 Document Revised: 03/12/2015 Document Reviewed: 09/19/2014 Elsevier Interactive Patient Education  2017 [employer]vier Inc.  

## 2017-08-20 NOTE — Assessment & Plan Note (Signed)
Feeling better and bowels moving.

## 2017-08-20 NOTE — Assessment & Plan Note (Signed)
Has started a MVI 2 days ago. Recheck cbc

## 2017-08-20 NOTE — Assessment & Plan Note (Signed)
Very slight and asymptomatic. cmp today

## 2017-08-20 NOTE — Progress Notes (Signed)
Subjective:  I acted as a Education administrator for Dr. Charlett Blake. [place], [place]  [name], female    DOB: [birth year], 61 y.o.   MRN: [id number]  No chief complaint on file.   HPI  Patient is in today for a follow up and she is feeling well. No recent febrile illness or acute hospitalizations. Is feeling better than last month. opthamology confirms opthamologic headaches. Has some blurry vision and photophobia still but it is better. No further severe abdominal pain but some mild discomfort. Bowels are moving well. No bloody or tarry stool. Denies CP/palp/SOB/HA/congestion/fevers or GU c/o. Taking meds as prescribed  Patient Care Team: Mosie Lukes, MD as PCP - General (Family Medicine)   Past Medical History:  Diagnosis Date  . Abnormal SPEP 07/26/2016  . Arthritis    knees, hands  . Back pain 07/14/2016  . Bowel obstruction ([place]) 11/2014  . CHF (congestive heart failure) ([place])   . Congestive heart failure ([place]) 02/24/2015   S/p pacemaker  . Depression   . Elevated sed rate 08/15/2017  . Epigastric pain 03/22/2017  . GERD (gastroesophageal reflux disease)   . Hyperlipidemia   . Hypertension   . Low back pain 07/14/2016  . 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 Myalgia 12/31/2016  . Obstructive sleep apnea 03/31/2015  . SOB (shortness of breath) 04/09/2016  . Stroke [facility])    TIAs    Past Surgical History:  Procedure Laterality Date  . ABDOMINAL HYSTERECTOMY     menorraghia, 2006, total  . CHOLECYSTECTOMY    . KNEE SURGERY     right, repair torn torn cartialage  . PACEMAKER INSERTION  08/2014  . TONSILLECTOMY    . TUBAL LIGATION      Family History  Problem Relation Age of Onset  . Diabetes Mother   . Hypertension Mother   . Heart disease Mother        s/p 1 stent  . Hyperlipidemia Mother   . Arthritis Mother   . Cancer Father        COLON  . Colon cancer Father 57  . Irritable bowel syndrome Sister   . Hyperlipidemia Daughter   .  Hypertension Daughter   . Hypertension Maternal Grandmother   . Arthritis Maternal Grandmother   . Heart disease Maternal Grandfather        MI  . Hypertension Maternal Grandfather   . Arthritis Maternal Grandfather   . Hypertension Son     Social History   Social History  . Marital status: Single    Spouse name: N/A  . Number of children: N/A  . Years of education: N/A   Occupational History  . [name].   Social History Main Topics  . Smoking status: Never Smoker  . Smokeless tobacco: Never Used  . Alcohol use No  . Drug use: No  . Sexual activity: No     Comment: Lives alone, no dietary restrictions   Other Topics Concern  . [name]   Social History Narrative  . No narrative on file    Outpatient Medications Prior to Visit  Medication Sig Dispense Refill  . acetaminophen (TYLENOL) 325 MG tablet Take 650 mg by mouth.    Marland Kitchen albuterol (PROVENTIL HFA;VENTOLIN HFA) 108 (90 Base) MCG/ACT inhaler Inhale 2 puffs into the lungs every 6 (six) hours as needed for wheezing or shortness of breath. 1 Inhaler 1  . amLODipine (NORVASC) 10 MG tablet Take 1 tablet (10 mg  total) by mouth daily. 90 tablet 1  . aspirin EC 81 MG tablet Take 1 tablet (81 mg total) by mouth daily. 30 tablet 1  . baclofen (LIORESAL) 20 MG tablet Take 1 tablet (20 mg total) by mouth 3 (three) times daily as needed for muscle spasms. 90 each 2  . carvedilol (COREG) 25 MG tablet Take 1 tablet (25 mg total) by mouth 2 (two) times daily with a meal. 180 tablet 1  . Cholecalciferol (VITAMIN D3) 5000 UNITS CAPS Take 1 capsule by mouth daily.    Marland Kitchen conjugated estrogens (PREMARIN) vaginal cream Place vaginally.    . dicyclomine (BENTYL) 20 MG tablet Take 1 tablet (20 mg total) by mouth 3 (three) times daily as needed for spasms. 60 tablet 1  . docusate sodium (COLACE) 50 MG capsule Take 50 mg by mouth 2 (two) times daily.    . ergocalciferol (VITAMIN D2) 50000 units capsule Take 1 capsule (50,000 Units total)  by mouth once a week. 12 capsule 1  . escitalopram (LEXAPRO) 10 MG tablet Take 1 tablet (10 mg total) by mouth daily. 30 tablet 3  . famotidine (PEPCID) 40 MG tablet Take 1 tablet (40 mg total) by mouth at bedtime. 90 tablet 1  . fluticasone (FLONASE) 50 MCG/ACT nasal spray Place 2 sprays into both nostrils daily. 16 g 6  . furosemide (LASIX) 20 MG tablet Take 1 tablet (20 mg total) by mouth daily as needed (WT gain>3# in 24 HR). 90 tablet 1  . gabapentin (NEURONTIN) 300 MG capsule Take 1 capsule (300 mg total) by mouth 3 (three) times daily. 90 capsule 3  . hyoscyamine (LEVSIN SL) 0.125 MG SL tablet Place 1 tablet (0.125 mg total) under the tongue every 4 (four) hours as needed. 90 tablet 01  . metoCLOPramide (REGLAN) 10 MG tablet Take 1 tablet (10 mg total) by mouth 4 (four) times daily -  before meals and at bedtime. 60 tablet 4  . modafinil (PROVIGIL) 100 MG tablet Take 1 tablet (100 mg total) by mouth daily. 30 tablet 5  . Multiple Vitamins-Minerals (CENTRUM SILVER ADULT 50+ PO) Take by mouth.    . pantoprazole (PROTONIX) 40 MG tablet Take 1 tablet (40 mg total) by mouth 2 (two) times daily. 180 tablet 1  . potassium chloride SA (KLOR-CON M20) 20 MEQ tablet Take 1 tablet (20 mEq total) by mouth daily. 30 tablet 1  . silver sulfADIAZINE (SILVADENE) 1 % cream Apply 1 application topically daily. 50 g 1  . sucralfate (CARAFATE) 1 g tablet Take 1 tablet (1 g total) by mouth 4 (four) times daily -  with meals and at bedtime. prn 120 tablet 3  . tiZANidine (ZANAFLEX) 4 MG tablet Take 0.5-1 tablets (2-4 mg total) by mouth at bedtime as needed for muscle spasms. 30 tablet 1  . triamterene-hydrochlorothiazide (MAXZIDE-25) 37.5-25 MG tablet Take 1 tablet by mouth daily. 90 tablet 1  . zolpidem (AMBIEN) 10 MG tablet Take 0.5-1 tablets (5-10 mg total) by mouth at bedtime as needed for sleep. 30 tablet 1   No facility-administered medications prior to visit.     Allergies  Allergen Reactions  .  Benazepril Anaphylaxis    angioedema  . Codeine Nausea And Vomiting  . Morphine     Redness and hives noted post IV admin on 07/05/17  . Ondansetron Hcl     Redness and hives post IV admin on 07/05/17    Review of Systems  Constitutional: Positive for malaise/fatigue. Negative for fever.  HENT: Negative for congestion.   Eyes: Positive for blurred vision and photophobia. Negative for double vision, pain, discharge and redness.  Respiratory: Negative for cough and shortness of breath.   Cardiovascular: Negative for chest pain, palpitations and leg swelling.  Gastrointestinal: Positive for abdominal pain. Negative for constipation, diarrhea and vomiting.  Musculoskeletal: Negative for back pain.  Skin: Negative for rash.  Neurological: Positive for headaches. Negative for loss of consciousness.       Objective:    Physical Exam  Constitutional: She is oriented to person, place, and time. She appears well-developed and well-nourished. No distress.  HENT:  Head: Normocephalic and atraumatic.  Eyes: Conjunctivae are normal.  Neck: Normal range of motion. No thyromegaly present.  Cardiovascular: Normal rate and regular rhythm.   Pulmonary/Chest: Effort normal and breath sounds normal. She has no wheezes.  Abdominal: Soft. Bowel sounds are normal. There is no tenderness.  Musculoskeletal: Normal range of motion. She exhibits no edema or deformity.  Neurological: She is alert and oriented to person, place, and time.  Skin: Skin is warm and dry. She is not diaphoretic.  Psychiatric: She has a normal mood and affect.    BP 122/82 (BP Location: Left Arm, Patient Position: Sitting, Cuff Size: Normal)   Pulse 90   Temp 97.9 F (36.6 C) (Oral)   Resp 18   Wt 172 lb 3.2 oz (78.1 kg)   SpO2 97%   BMI 27.79 kg/m  Wt Readings from Last 3 Encounters:  08/20/17 172 lb 3.2 oz (78.1 kg)  08/12/17 175 lb (79.4 kg)  08/09/17 172 lb (78 kg)   BP Readings from Last 3 Encounters:  08/20/17  122/82  08/12/17 130/90  08/09/17 (!) 147/100     Immunization History  Administered Date(s) Administered  . Influenza Whole 09/09/2009, 09/12/2013, 07/12/2015  . Influenza,inj,Quad PF,6+ Mos 07/14/2016, 07/27/2017  . Influenza-Unspecified 08/23/2014, 07/06/2015  . Pneumococcal Conjugate-13 07/12/2015  . Pneumococcal Polysaccharide-23 09/06/2014, 07/12/2015  . Pneumococcal-Unspecified 07/06/2015  . Tdap 12/01/2010, 10/11/2015  . Zoster 10/11/2015, 09/15/2016    Health Maintenance  Topic Date Due  . MAMMOGRAM  12/23/2017  . PAP SMEAR  10/10/2018  . COLONOSCOPY  10/26/2019  . TETANUS/TDAP  10/10/2025  . INFLUENZA VACCINE  Completed  . Hepatitis C Screening  Completed  . HIV Screening  Completed    Lab Results  Component Value Date   WBC 2.5 (L) 07/27/2017   HGB 11.2 (L) 07/27/2017   HCT 34.9 (L) 07/27/2017   PLT 132.0 (L) 07/27/2017   GLUCOSE 117 (H) 07/27/2017   CHOL 163 12/31/2016   TRIG 65.0 12/31/2016   HDL 48.00 12/31/2016   LDLCALC 102 (H) 12/31/2016   ALT 10 07/27/2017   AST 17 07/27/2017   NA 138 07/27/2017   K 3.4 (L) 07/27/2017   CL 106 07/27/2017   CREATININE 0.66 07/27/2017   BUN 18 07/27/2017   CO2 27 07/27/2017   TSH 1.34 12/31/2016    Lab Results  Component Value Date   TSH 1.34 12/31/2016   Lab Results  Component Value Date   WBC 2.5 (L) 07/27/2017   HGB 11.2 (L) 07/27/2017   HCT 34.9 (L) 07/27/2017   MCV 88.5 07/27/2017   PLT 132.0 (L) 07/27/2017   Lab Results  Component Value Date   NA 138 07/27/2017   K 3.4 (L) 07/27/2017   CHLORIDE 108 12/31/2016   CO2 27 07/27/2017   GLUCOSE 117 (H) 07/27/2017   BUN 18 07/27/2017   CREATININE 0.66 07/27/2017  BILITOT 0.3 07/27/2017   ALKPHOS 43 07/27/2017   AST 17 07/27/2017   ALT 10 07/27/2017   PROT 9.4 (H) 07/27/2017   ALBUMIN 3.4 (L) 07/27/2017   CALCIUM 9.4 07/27/2017   ANIONGAP 9 12/31/2016   EGFR >90 12/31/2016   GFR 116.27 07/27/2017   Lab Results  Component Value Date    CHOL 163 12/31/2016   Lab Results  Component Value Date   HDL 48.00 12/31/2016   Lab Results  Component Value Date   LDLCALC 102 (H) 12/31/2016   Lab Results  Component Value Date   TRIG 65.0 12/31/2016   Lab Results  Component Value Date   CHOLHDL 3 12/31/2016   No results found for: HGBA1C       Assessment & Plan:   Problem List Items Addressed This Visit    Hypertension    .Well controlled, no changes to meds. Encouraged heart healthy diet such as the DASH diet and exercise as tolerated.       Epigastric pain    Feeling better and bowels moving.      Ocular migraine    Saw her opthamologist, Dr Kerry Kass and has ocular migraines. Has used Topamax in past with good results will restart at Topamax 25 po bid      Relevant Medications   topiramate (TOPAMAX) 25 MG tablet   Hypokalemia - Primary    Very slight and asymptomatic. cmp today      Relevant Orders   Comprehensive metabolic panel   Anemia    Has started a MVI 2 days ago. Recheck cbc      Relevant Orders   CBC      I am having Ms. Ulatowski start on topiramate. I am also having her maintain her docusate sodium, Vitamin D3, Multiple Vitamins-Minerals (CENTRUM SILVER ADULT 50+ PO), modafinil, zolpidem, ergocalciferol, albuterol, silver sulfADIAZINE, baclofen, gabapentin, tiZANidine, conjugated estrogens, acetaminophen, metoCLOPramide, hyoscyamine, famotidine, carvedilol, triamterene-hydrochlorothiazide, furosemide, amLODipine, pantoprazole, dicyclomine, sucralfate, escitalopram, potassium chloride SA, fluticasone, and aspirin EC.  Meds ordered this encounter  Medications  . topiramate (TOPAMAX) 25 MG tablet    Sig: Take 1 tablet (25 mg total) by mouth 2 (two) times daily.    Dispense:  60 tablet    Refill:  2    CMA served as scribe during this visit. History, Physical and Plan performed by medical provider. Documentation and orders reviewed and attested to.  Penni Homans, MD

## 2017-08-27 ENCOUNTER — Telehealth: Payer: Self-pay | Admitting: Family Medicine

## 2017-08-27 NOTE — Telephone Encounter (Signed)
[facility] Day - Client TELEPHONE ADVICE RECORD [facility] Patient Name: Tami [name] DOB: 1956-02-11 Initial Comment Caller states she has a headache. Nurse Assessment Nurse: Richardson Landry, RN, [place] Date/Time (Eastern Time): 08/27/2017 4:42:07 PM Confirm and document reason for call. If symptomatic, describe symptoms. ---Caller states she is having headaches was seen last week due to headaches. Started topamax but it may be a while before it takes effect, 3-4 weeks. Caller states she has been taking tylenol and 800mg  of ibuprofen as needed. Caller states she has been taking tylenol with no relief because she has been taking it for years. Caller states it has to do with a migraine, went to eye dr. Deborha Payment gave her a shot in her hip and placed her on abx and 3-4 days it went away but now it is coming back. Caller states she has oxycodone and tramadol and wants to know if she needs to take those. Declines triage just wants to know if she can take those medications. Does the patient have any new or worsening symptoms? ---Yes Will a triage be completed? ---No Select reason for no triage. ---Patient declined Please document clinical information provided and list any resource used. ---RN spoke to Tiffany in office who advises there are not any doctor's in office at this time. Warm transferred caller to Tiffany in office for further assistance. Guidelines Guideline Title Affirmed Question Affirmed Notes Final Disposition User Clinical Call Richardson Landry, RN, [facility] Call Id: [phone]

## 2017-09-06 NOTE — Telephone Encounter (Signed)
I have never given her Tramadol and no Oxycodone since 2016 but if she has a bad headache she could try taking 1-2 Oxycodone with 1 ES Tylenol and see if that breaks her headache.

## 2017-09-06 NOTE — Telephone Encounter (Signed)
Follow up call made to patient states she has been talking Oxycodone and Tramadol for her Migraines. WIll be in for office visit on 09/14/17.

## 2017-09-10 ENCOUNTER — Telehealth: Payer: Self-pay | Admitting: *Deleted

## 2017-09-10 NOTE — Telephone Encounter (Signed)
Patient made aware.

## 2017-09-10 NOTE — Telephone Encounter (Signed)
Received Physician Orders from [facility] for CPAP supplies; forwarded to provider/SLS 11/02

## 2017-09-14 ENCOUNTER — Other Ambulatory Visit: Payer: Self-pay

## 2017-09-14 ENCOUNTER — Ambulatory Visit (INDEPENDENT_AMBULATORY_CARE_PROVIDER_SITE_OTHER): Payer: Medicaid Other | Admitting: Family Medicine

## 2017-09-14 ENCOUNTER — Encounter: Payer: Self-pay | Admitting: Family Medicine

## 2017-09-14 ENCOUNTER — Ambulatory Visit: Payer: Medicaid Other | Admitting: Family Medicine

## 2017-09-14 DIAGNOSIS — A0472 Enterocolitis due to Clostridium difficile, not specified as recurrent: Secondary | ICD-10-CM | POA: Diagnosis not present

## 2017-09-14 DIAGNOSIS — D649 Anemia, unspecified: Secondary | ICD-10-CM | POA: Diagnosis not present

## 2017-09-14 DIAGNOSIS — E785 Hyperlipidemia, unspecified: Secondary | ICD-10-CM

## 2017-09-14 DIAGNOSIS — I10 Essential (primary) hypertension: Secondary | ICD-10-CM

## 2017-09-14 NOTE — Patient Instructions (Signed)
Start a probiotic such as Digestive Advantage, Culturelle, Align daily to help with the recovery from the CDiff Clostridium Difficile Infection Clostridium difficile (C. difficile or C. diff) infection causes inflammation of the large intestine (colon). This condition can result in damage to the lining of your colon and may lead to another condition called colitis. This infection can be passed from person to person (is contagious). Follow these instructions at home: Eating and drinking  Drink enough fluid to keep your pee (urine) clear or pale yellow.  Avoid drinking: ? Milk. ? Caffeine. ? Alcohol.  Follow exact instructions from your doctor about how to get enough fluid in your body (rehydrate).  Eat small meals often instead of large meals. Medicines  Take your antibiotic medicine as told by your doctor. Do not stop taking the antibiotic even if you start to feel better unless your doctor told you to do that.  Take over-the-counter and prescription medicines only as told by your doctor.  Do not use medicines to help with watery poop (diarrhea). General instructions  Wash your hands fully before you prepare food and after you use the bathroom. Make sure people who live with you also wash their  hands often.  Clean the surfaces that you touch. Use a product that contains chlorine bleach.  Keep all follow-up visits as told by your doctor. This is important. Contact a doctor if:  Your symptoms do not get better with treatment.  Your symptoms get worse with treatment.  Your symptoms go away and then come back.  You have a fever.  You have new symptoms. Get help right away if:  You have more pain or tenderness in your belly (abdomen).  Your poop (stool) is mostly bloody.  Your poop looks dark black and tarry.  You cannot eat or drink without throwing up (vomiting).  You have signs of dehydration, such as: ? Dark pee, very little pee, or no pee. ? Cracked  lips. ? Not making tears when you cry. ? Dry mouth. ? Sunken eyes. ? Feeling sleepy. ? Feeling weak. ? Feeling dizzy. This information is not intended to replace advice given to you by your health care provider. Make sure you discuss any questions you have with your health care provider. Document Released: 08/23/2009 Document Revised: 04/02/2016 Document Reviewed: 04/29/2015 Elsevier Interactive Patient Education  2017 [employer].

## 2017-09-14 NOTE — Progress Notes (Signed)
Subjective:  I acted as a Education administrator for [employer]. [name], [place]   [name], female    DOB: [birth year], 61 y.o.   MRN: [id number]  Chief Complaint  Patient presents with  . 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 Denies CP/palp/SOB/HA/congestion/feversor GU c/o. Taking meds as prescribed  Patient Care Team: Mosie Lukes, MD as PCP - General (Family Medicine)   Past Medical History:  Diagnosis Date  . Abnormal SPEP 07/26/2016  . Arthritis    knees, hands  . Back pain 07/14/2016  . Bowel obstruction ([place]) 11/2014  . CHF (congestive heart failure) ([place])   . Congestive heart failure ([place]) 02/24/2015   S/p pacemaker  . Depression   . Elevated sed rate 08/15/2017  . Epigastric pain 03/22/2017  . GERD (gastroesophageal reflux disease)   . Hyperlipidemia   . Hypertension   . Low back pain 07/14/2016  . 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 Myalgia 12/31/2016  . Obstructive sleep apnea 03/31/2015  . SOB (shortness of breath) 04/09/2016  . Stroke [facility])    TIAs    Past Surgical History:  Procedure Laterality Date  . ABDOMINAL HYSTERECTOMY     menorraghia, 2006, total  . CHOLECYSTECTOMY    . KNEE SURGERY     right, repair torn torn cartialage  . PACEMAKER INSERTION  08/2014  . TONSILLECTOMY    . TUBAL LIGATION      Family History  Problem Relation Age of Onset  . Diabetes Mother   . Hypertension Mother   . Heart disease Mother        s/p 1 stent  . Hyperlipidemia Mother   . Arthritis Mother   . Cancer Father        COLON  . Colon cancer Father 87  . Irritable bowel syndrome Sister   . Hyperlipidemia Daughter   . Hypertension Daughter   . Hypertension Maternal Grandmother   . Arthritis Maternal Grandmother   . Heart disease Maternal Grandfather        MI  .  Hypertension Maternal Grandfather   . Arthritis Maternal Grandfather   . Hypertension Son     Social History   Socioeconomic History  . Marital status: Single    Spouse name: [name]  . Number of children: [name]  . Years of education: [name]  . Highest education level: [name]  Social Needs  . Financial resource strain: [name]  . Food insecurity - worry: [name]  . Food insecurity - inability: [name]  . Transportation needs - medical: [name]  . Transportation needs - non-medical: [name]  Occupational History  . [name]  Tobacco Use  . Smoking status: Never Smoker  . Smokeless tobacco: Never Used  Substance and Sexual Activity  . Alcohol use: No    Alcohol/week: 0.0 oz  . Drug use: No  . Sexual activity: No    Comment: Lives alone, no dietary restrictions  Other Topics Concern  . [name]  Social History Narrative  . [name]    Outpatient Medications Prior to Visit  Medication Sig Dispense Refill  . acetaminophen (TYLENOL) 325 MG tablet Take 650 mg by mouth.    Marland Kitchen albuterol (  PROVENTIL HFA;VENTOLIN HFA) 108 (90 Base) MCG/ACT inhaler Inhale 2 puffs into the lungs every 6 (six) hours as needed for wheezing or shortness of breath. 1 Inhaler 1  . amLODipine (NORVASC) 10 MG tablet Take 1 tablet (10 mg total) by mouth daily. 90 tablet 1  . aspirin EC 81 MG tablet Take 1 tablet (81 mg total) by mouth daily. 30 tablet 1  . baclofen (LIORESAL) 20 MG tablet Take 1 tablet (20 mg total) by mouth 3 (three) times daily as needed for muscle spasms. 90 each 2  . carvedilol (COREG) 25 MG tablet Take 1 tablet (25 mg total) by mouth 2 (two) times daily with a meal. 180 tablet 1  . Cholecalciferol (VITAMIN D3) 5000 UNITS CAPS Take 1 capsule by mouth daily.    Marland Kitchen conjugated estrogens (PREMARIN) vaginal cream Place vaginally.    . dicyclomine (BENTYL) 20 MG tablet Take 1 tablet (20 mg total) by mouth 3 (three) times daily as needed for spasms.  60 tablet 1  . docusate sodium (COLACE) 50 MG capsule Take 50 mg by mouth 2 (two) times daily.    . ergocalciferol (VITAMIN D2) 50000 units capsule Take 1 capsule (50,000 Units total) by mouth once a week. 12 capsule 1  . escitalopram (LEXAPRO) 10 MG tablet Take 1 tablet (10 mg total) by mouth daily. 30 tablet 3  . famotidine (PEPCID) 40 MG tablet Take 1 tablet (40 mg total) by mouth at bedtime. 90 tablet 1  . fluticasone (FLONASE) 50 MCG/ACT nasal spray Place 2 sprays into both nostrils daily. 16 g 6  . furosemide (LASIX) 20 MG tablet Take 1 tablet (20 mg total) by mouth daily as needed (WT gain>3# in 24 HR). 90 tablet 1  . gabapentin (NEURONTIN) 300 MG capsule Take 1 capsule (300 mg total) by mouth 3 (three) times daily. 90 capsule 3  . hyoscyamine (LEVSIN SL) 0.125 MG SL tablet Place 1 tablet (0.125 mg total) under the tongue every 4 (four) hours as needed. 90 tablet 01  . metoCLOPramide (REGLAN) 10 MG tablet Take 1 tablet (10 mg total) by mouth 4 (four) times daily -  before meals and at bedtime. 60 tablet 4  . modafinil (PROVIGIL) 100 MG tablet Take 1 tablet (100 mg total) by mouth daily. 30 tablet 5  . Multiple Vitamins-Minerals (CENTRUM SILVER ADULT 50+ PO) Take by mouth.    . pantoprazole (PROTONIX) 40 MG tablet Take 1 tablet (40 mg total) by mouth 2 (two) times daily. 180 tablet 1  . potassium chloride SA (KLOR-CON M20) 20 MEQ tablet Take 1 tablet (20 mEq total) by mouth daily. 30 tablet 1  . silver sulfADIAZINE (SILVADENE) 1 % cream Apply 1 application topically daily. 50 g 1  . sucralfate (CARAFATE) 1 g tablet Take 1 tablet (1 g total) by mouth 4 (four) times daily -  with meals and at bedtime. prn 120 tablet 3  . tiZANidine (ZANAFLEX) 4 MG tablet Take 0.5-1 tablets (2-4 mg total) by mouth at bedtime as needed for muscle spasms. 30 tablet 1  . topiramate (TOPAMAX) 25 MG tablet Take 1 tablet (25 mg total) by mouth 2 (two) times daily. 60 tablet 2  . triamterene-hydrochlorothiazide  (MAXZIDE-25) 37.5-25 MG tablet Take 1 tablet by mouth daily. 90 tablet 1  . zolpidem (AMBIEN) 10 MG tablet Take 0.5-1 tablets (5-10 mg total) by mouth at bedtime as needed for sleep. 30 tablet 1   No facility-administered medications prior to visit.     Allergies  Allergen Reactions  . Benazepril Anaphylaxis    angioedema  . Codeine Nausea And Vomiting  . Morphine     Redness and hives noted post IV admin on 07/05/17  . Ondansetron Hcl     Redness and hives post IV admin on 07/05/17    Review of Systems  Constitutional: Negative for fever and malaise/fatigue.  HENT: Negative for congestion.   Respiratory: Negative for cough and shortness of breath.   Cardiovascular: Negative for chest pain and palpitations.  Gastrointestinal: Positive for abdominal pain and diarrhea. Negative for vomiting.  Musculoskeletal: Negative for back pain.  Skin: Negative for rash.  Neurological: Negative for loss of consciousness and headaches.       Objective:    Physical Exam  Constitutional: She is oriented to person, place, and time. She appears well-developed and well-nourished. No distress.  HENT:  Head: Normocephalic and atraumatic.  Eyes: Conjunctivae are normal.  Neck: Normal range of motion. No thyromegaly present.  Cardiovascular: Normal rate and regular rhythm.  Pulmonary/Chest: Effort normal and breath sounds normal. She has no wheezes.  Abdominal: Soft. Bowel sounds are normal. There is no tenderness.  Musculoskeletal: Normal range of motion. She exhibits no edema or deformity.  Neurological: She is alert and oriented to person, place, and time.  Skin: Skin is warm and dry. She is not diaphoretic.  Psychiatric: She has a normal mood and affect.    BP 122/64 (BP Location: Left Arm, Patient Position: Sitting, Cuff Size: Small)   Pulse 89   Temp 97.8 F (36.6 C) (Oral)   Resp 16   Wt 176 lb 3.2 oz (79.9 kg)   SpO2 96%   BMI 28.44 kg/m  Wt Readings from Last 3 Encounters:    09/14/17 176 lb 3.2 oz (79.9 kg)  08/20/17 172 lb 3.2 oz (78.1 kg)  08/12/17 175 lb (79.4 kg)   BP Readings from Last 3 Encounters:  09/14/17 122/64  08/20/17 122/82  08/12/17 130/90     Immunization History  Administered Date(s) Administered  . Influenza Whole 09/09/2009, 09/12/2013, 07/12/2015  . Influenza,inj,Quad PF,6+ Mos 07/14/2016, 07/27/2017  . Influenza-Unspecified 08/23/2014, 07/06/2015  . Pneumococcal Conjugate-13 07/12/2015  . Pneumococcal Polysaccharide-23 09/06/2014, 07/12/2015  . Pneumococcal-Unspecified 07/06/2015  . Tdap 12/01/2010, 10/11/2015  . Zoster 10/11/2015, 09/15/2016    Health Maintenance  Topic Date Due  . MAMMOGRAM  12/23/2017  . PAP SMEAR  10/10/2018  . COLONOSCOPY  10/26/2019  . TETANUS/TDAP  10/10/2025  . INFLUENZA VACCINE  Completed  . Hepatitis C Screening  Completed  . HIV Screening  Completed    Lab Results  Component Value Date   WBC 3.6 (L) 08/20/2017   HGB 11.4 (L) 08/20/2017   HCT 34.7 (L) 08/20/2017   PLT 156.0 08/20/2017   GLUCOSE 95 08/20/2017   CHOL 163 12/31/2016   TRIG 65.0 12/31/2016   HDL 48.00 12/31/2016   LDLCALC 102 (H) 12/31/2016   ALT 23 08/20/2017   AST 44 (H) 08/20/2017   NA 138 08/20/2017   K 3.6 08/20/2017   CL 103 08/20/2017   CREATININE 0.67 08/20/2017   BUN 14 08/20/2017   CO2 28 08/20/2017   TSH 1.34 12/31/2016    Lab Results  Component Value Date   TSH 1.34 12/31/2016   Lab Results  Component Value Date   WBC 3.6 (L) 08/20/2017   HGB 11.4 (L) 08/20/2017   HCT 34.7 (L) 08/20/2017   MCV 88.0 08/20/2017   PLT 156.0 08/20/2017   Lab Results  Component  Value Date   NA 138 08/20/2017   K 3.6 08/20/2017   CHLORIDE 108 12/31/2016   CO2 28 08/20/2017   GLUCOSE 95 08/20/2017   BUN 14 08/20/2017   CREATININE 0.67 08/20/2017   BILITOT 0.4 08/20/2017   ALKPHOS 37 (L) 08/20/2017   AST 44 (H) 08/20/2017   ALT 23 08/20/2017   PROT 9.5 (H) 08/20/2017   ALBUMIN 3.5 08/20/2017   CALCIUM 8.8  08/20/2017   ANIONGAP 9 12/31/2016   EGFR >90 12/31/2016   GFR 114.24 08/20/2017   Lab Results  Component Value Date   CHOL 163 12/31/2016   Lab Results  Component Value Date   HDL 48.00 12/31/2016   Lab Results  Component Value Date   LDLCALC 102 (H) 12/31/2016   Lab Results  Component Value Date   TRIG 65.0 12/31/2016   Lab Results  Component Value Date   CHOLHDL 3 12/31/2016   No results found for: HGBA1C       Assessment & Plan:   Problem List Items Addressed This Visit    Hyperlipidemia   Hypertension    Well controlled, no changes to meds. Encouraged heart healthy diet such as the DASH diet and exercise as tolerated.       Anemia    Increase leafy greens, consider increased lean red meat and using cast iron cookware. Continue to monitor, report any concerns      C. difficile colitis    Recently admitted to [facility] and diagnosed with CDiff and is now feeling better since being started on Vancomycin. She is continuing to follow with gastroenterology         I am having Tami [name] maintain her docusate sodium, Vitamin D3, Multiple Vitamins-Minerals (CENTRUM SILVER ADULT 50+ PO), modafinil, zolpidem, ergocalciferol, albuterol, silver sulfADIAZINE, baclofen, gabapentin, tiZANidine, conjugated estrogens, acetaminophen, metoCLOPramide, hyoscyamine, famotidine, carvedilol, triamterene-hydrochlorothiazide, furosemide, amLODipine, pantoprazole, dicyclomine, sucralfate, escitalopram, potassium chloride SA, fluticasone, aspirin EC, and topiramate.  No orders of the defined types were placed in this encounter.   CMA served as Education administrator during this visit. History, Physical and Plan performed by medical provider. Documentation and orders reviewed and attested to.  Penni Homans, MD

## 2017-09-15 DIAGNOSIS — A0472 Enterocolitis due to Clostridium difficile, not specified as recurrent: Secondary | ICD-10-CM | POA: Insufficient documentation

## 2017-09-15 NOTE — Assessment & Plan Note (Signed)
Well controlled, no changes to meds. Encouraged heart healthy diet such as the DASH diet and exercise as tolerated.  °

## 2017-09-15 NOTE — Assessment & Plan Note (Deleted)
Encouraged heart healthy diet, increase exercise, avoid trans fats, consider a krill oil cap daily 

## 2017-09-15 NOTE — Assessment & Plan Note (Signed)
Recently admitted to [facility] and diagnosed with CDiff and is now feeling better since being started on Vancomycin. She is continuing to follow with gastroenterology

## 2017-09-15 NOTE — Assessment & Plan Note (Signed)
Increase leafy greens, consider increased lean red meat and using cast iron cookware. Continue to monitor, report any concerns 

## 2017-09-16 MED ORDER — FUROSEMIDE 20 MG PO TABS: 20.0000 mg | ORAL_TABLET | Freq: Every day | ORAL | 1 refills | Status: DC | PRN

## 2017-09-20 ENCOUNTER — Telehealth: Payer: Self-pay | Admitting: Family Medicine

## 2017-09-20 NOTE — Telephone Encounter (Signed)
Caller name: Relation to SH:FWYO Call back [place] main  Reason for call: pt is needing rx vancomycin hcl 125 mg, pt is completely out, pt states she is still having abdomen pain.

## 2017-09-21 MED ORDER — VANCOMYCIN HCL 125 MG PO CAPS: 125.0000 mg | ORAL_CAPSULE | Freq: Four times a day (QID) | ORAL | 0 refills | Status: DC

## 2017-09-21 NOTE — Telephone Encounter (Signed)
Can she have refill  Please advise and I will send

## 2017-09-21 NOTE — Telephone Encounter (Signed)
Ok to send in Vancomycin 125 mg caps, 1 cap po qid x 7 days but she needs to call her gastroenterologist for follow up if she is still hurting

## 2017-09-21 NOTE — Telephone Encounter (Signed)
Medication sent.

## 2017-09-21 NOTE — Telephone Encounter (Signed)
Pt called in to check the status of refill request.    Princess have Rx been faxed?    Please assist further.

## 2017-09-21 NOTE — Telephone Encounter (Signed)
Patient calling back checking on the status of medication request, please advise when Rx is sent to  [place], [place] [phone] (Phone) [phone] (Fax)

## 2017-09-28 ENCOUNTER — Ambulatory Visit: Payer: Medicaid Other | Admitting: Family Medicine

## 2017-10-11 ENCOUNTER — Encounter: Payer: Self-pay | Admitting: Family Medicine

## 2017-10-11 ENCOUNTER — Ambulatory Visit (INDEPENDENT_AMBULATORY_CARE_PROVIDER_SITE_OTHER): Payer: Medicaid Other | Admitting: Family Medicine

## 2017-10-11 DIAGNOSIS — D472 Monoclonal gammopathy: Secondary | ICD-10-CM | POA: Diagnosis not present

## 2017-10-11 DIAGNOSIS — I10 Essential (primary) hypertension: Secondary | ICD-10-CM

## 2017-10-11 DIAGNOSIS — A0472 Enterocolitis due to Clostridium difficile, not specified as recurrent: Secondary | ICD-10-CM

## 2017-10-11 DIAGNOSIS — I776 Arteritis, unspecified: Secondary | ICD-10-CM

## 2017-10-11 DIAGNOSIS — K2100 Gastro-esophageal reflux disease with esophagitis, without bleeding: Secondary | ICD-10-CM

## 2017-10-11 DIAGNOSIS — R109 Unspecified abdominal pain: Secondary | ICD-10-CM

## 2017-10-11 MED ORDER — SUCRALFATE 1 G PO TABS: 1.0000 g | ORAL_TABLET | Freq: Three times a day (TID) | ORAL | 1 refills | Status: DC

## 2017-10-11 MED ORDER — FUROSEMIDE 20 MG PO TABS: 20.0000 mg | ORAL_TABLET | Freq: Every day | ORAL | 1 refills | Status: DC | PRN

## 2017-10-11 MED ORDER — POTASSIUM CHLORIDE CRYS ER 20 MEQ PO TBCR: 20.0000 meq | EXTENDED_RELEASE_TABLET | Freq: Every day | ORAL | 1 refills | Status: DC

## 2017-10-11 MED ORDER — TOPIRAMATE 25 MG PO TABS: 25.0000 mg | ORAL_TABLET | Freq: Two times a day (BID) | ORAL | 2 refills | Status: DC

## 2017-10-11 MED ORDER — ALBUTEROL SULFATE HFA 108 (90 BASE) MCG/ACT IN AERS: 2.0000 | INHALATION_SPRAY | Freq: Four times a day (QID) | RESPIRATORY_TRACT | 1 refills | Status: DC | PRN

## 2017-10-11 MED ORDER — CARVEDILOL 25 MG PO TABS: 25.0000 mg | ORAL_TABLET | Freq: Two times a day (BID) | ORAL | 1 refills | Status: DC

## 2017-10-11 MED ORDER — PANTOPRAZOLE SODIUM 40 MG PO TBEC: 40.0000 mg | DELAYED_RELEASE_TABLET | Freq: Two times a day (BID) | ORAL | 1 refills | Status: DC

## 2017-10-11 MED ORDER — FLUTICASONE PROPIONATE 50 MCG/ACT NA SUSP: 2.0000 | Freq: Every day | NASAL | 6 refills | Status: DC

## 2017-10-11 MED ORDER — ASPIRIN EC 81 MG PO TBEC: 81.0000 mg | DELAYED_RELEASE_TABLET | Freq: Every day | ORAL | 1 refills | Status: DC

## 2017-10-11 MED ORDER — HYDROCODONE-ACETAMINOPHEN 5-325 MG PO TABS: 1.0000 | ORAL_TABLET | Freq: Three times a day (TID) | ORAL | 0 refills | Status: DC | PRN

## 2017-10-11 MED ORDER — TRIAMTERENE-HCTZ 37.5-25 MG PO TABS: 1.0000 | ORAL_TABLET | Freq: Every day | ORAL | 1 refills | Status: DC

## 2017-10-11 MED ORDER — AMLODIPINE BESYLATE 10 MG PO TABS: 10.0000 mg | ORAL_TABLET | Freq: Every day | ORAL | 1 refills | Status: DC

## 2017-10-11 NOTE — Assessment & Plan Note (Signed)
Avoid offending foods, start probiotics. Do not eat large meals in late evening and consider raising head of bed. Describes a burning sensation, did not try the Carafate is given a new prescription to try again

## 2017-10-11 NOTE — Assessment & Plan Note (Addendum)
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 Is more formed again but she is having increased abdominal pain recently as well. Hydrocodone/APAP 5/325 for severe abdominal pain which she has used previously with good results.

## 2017-10-11 NOTE — Progress Notes (Signed)
Subjective:  I acted as a Education administrator for Dr. Charlett Blake. Tami [name], [place]  [name], female    DOB: 12/15/1955, 61 y.o.   MRN: [id number]  No chief complaint on file.   HPI  Patient is in today for a hospital follow up   Patient Care Team: Mosie Lukes, MD as PCP - General (Family Medicine)   Past Medical History:  Diagnosis Date  . Abnormal SPEP 07/26/2016  . Arthritis    knees, hands  . Back pain 07/14/2016  . Bowel obstruction ([place]) 11/2014  . CHF (congestive heart failure) ([place])   . Congestive heart failure ([place]) 02/24/2015   S/p pacemaker  . Depression   . Elevated sed rate 08/15/2017  . Epigastric pain 03/22/2017  . GERD (gastroesophageal reflux disease)   . Hyperlipidemia   . Hypertension   . Low back pain 07/14/2016  . 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 Myalgia 12/31/2016  . Obstructive sleep apnea 03/31/2015  . SOB (shortness of breath) 04/09/2016  . Stroke [facility])    TIAs    Past Surgical History:  Procedure Laterality Date  . ABDOMINAL HYSTERECTOMY     menorraghia, 2006, total  . CHOLECYSTECTOMY    . KNEE SURGERY     right, repair torn torn cartialage  . PACEMAKER INSERTION  08/2014  . TONSILLECTOMY    . TUBAL LIGATION      Family History  Problem Relation Age of Onset  . Diabetes Mother   . Hypertension Mother   . Heart disease Mother        s/p 1 stent  . Hyperlipidemia Mother   . Arthritis Mother   . Cancer Father        COLON  . Colon cancer Father 85  . Irritable bowel syndrome Sister   . Hyperlipidemia Daughter   . Hypertension Daughter   . Hypertension Maternal Grandmother   . Arthritis Maternal Grandmother   . Heart disease Maternal Grandfather        MI  . Hypertension Maternal Grandfather   . Arthritis Maternal Grandfather   . Hypertension Son     Social History   Socioeconomic History  . Marital status: Single    Spouse name: [name]  . Number of children: [name]  . Years of  education: [name]  . Highest education level: [name]  Social Needs  . Financial resource strain: [name]  . Food insecurity - worry: [name]  . Food insecurity - inability: [name]  . Transportation needs - medical: [name]  . Transportation needs - non-medical: [name]  Occupational History  . [name]  Tobacco Use  . Smoking status: Never Smoker  . Smokeless tobacco: Never Used  Substance and Sexual Activity  . Alcohol use: No    Alcohol/week: 0.0 oz  . Drug use: No  . Sexual activity: No    Comment: Lives alone, no dietary restrictions  Other Topics Concern  . [name]  Social History Narrative  . [name]    Outpatient Medications Prior to Visit  Medication Sig Dispense Refill  . acetaminophen (TYLENOL) 325 MG tablet Take 650 mg by mouth.    . Cholecalciferol (VITAMIN D3) 5000 UNITS CAPS Take 1 capsule by mouth daily.    Marland Kitchen conjugated estrogens (PREMARIN) vaginal cream Place vaginally.    . docusate sodium (COLACE) 50 MG capsule Take 50 mg  by mouth 2 (two) times daily.    . ergocalciferol (VITAMIN D2) 50000 units capsule Take 1 capsule (50,000 Units total) by mouth once a week. 12 capsule 1  . escitalopram (LEXAPRO) 10 MG tablet Take 1 tablet (10 mg total) by mouth daily. 30 tablet 3  . famotidine (PEPCID) 40 MG tablet Take 1 tablet (40 mg total) by mouth at bedtime. 90 tablet 1  . gabapentin (NEURONTIN) 300 MG capsule Take 1 capsule (300 mg total) by mouth 3 (three) times daily. 90 capsule 3  . metoCLOPramide (REGLAN) 10 MG tablet Take 1 tablet (10 mg total) by mouth 4 (four) times daily -  before meals and at bedtime. 60 tablet 4  . modafinil (PROVIGIL) 100 MG tablet Take 1 tablet (100 mg total) by mouth daily. 30 tablet 5  . Multiple Vitamins-Minerals (CENTRUM SILVER ADULT 50+ PO) Take by mouth.    . silver sulfADIAZINE (SILVADENE) 1 % cream Apply 1 application topically daily. 50 g 1  . tiZANidine (ZANAFLEX) 4 MG tablet Take 0.5-1  tablets (2-4 mg total) by mouth at bedtime as needed for muscle spasms. 30 tablet 1  . vancomycin (VANCOCIN) 125 MG capsule Take 1 capsule (125 mg total) 4 (four) times daily by mouth. For (7) seven days 28 capsule 0  . albuterol (PROVENTIL HFA;VENTOLIN HFA) 108 (90 Base) MCG/ACT inhaler Inhale 2 puffs into the lungs every 6 (six) hours as needed for wheezing or shortness of breath. 1 Inhaler 1  . amLODipine (NORVASC) 10 MG tablet Take 1 tablet (10 mg total) by mouth daily. 90 tablet 1  . aspirin EC 81 MG tablet Take 1 tablet (81 mg total) by mouth daily. 30 tablet 1  . baclofen (LIORESAL) 20 MG tablet Take 1 tablet (20 mg total) by mouth 3 (three) times daily as needed for muscle spasms. 90 each 2  . carvedilol (COREG) 25 MG tablet Take 1 tablet (25 mg total) by mouth 2 (two) times daily with a meal. 180 tablet 1  . dicyclomine (BENTYL) 20 MG tablet Take 1 tablet (20 mg total) by mouth 3 (three) times daily as needed for spasms. 60 tablet 1  . fluticasone (FLONASE) 50 MCG/ACT nasal spray Place 2 sprays into both nostrils daily. 16 g 6  . furosemide (LASIX) 20 MG tablet Take 1 tablet (20 mg total) daily as needed by mouth (WT gain>3# in 24 HR). 90 tablet 1  . hyoscyamine (LEVSIN SL) 0.125 MG SL tablet Place 1 tablet (0.125 mg total) under the tongue every 4 (four) hours as needed. 90 tablet 01  . pantoprazole (PROTONIX) 40 MG tablet Take 1 tablet (40 mg total) by mouth 2 (two) times daily. 180 tablet 1  . potassium chloride SA (KLOR-CON M20) 20 MEQ tablet Take 1 tablet (20 mEq total) by mouth daily. 30 tablet 1  . sucralfate (CARAFATE) 1 g tablet Take 1 tablet (1 g total) by mouth 4 (four) times daily -  with meals and at bedtime. prn 120 tablet 3  . topiramate (TOPAMAX) 25 MG tablet Take 1 tablet (25 mg total) by mouth 2 (two) times daily. 60 tablet 2  . triamterene-hydrochlorothiazide (MAXZIDE-25) 37.5-25 MG tablet Take 1 tablet by mouth daily. 90 tablet 1  . zolpidem (AMBIEN) 10 MG tablet Take  0.5-1 tablets (5-10 mg total) by mouth at bedtime as needed for sleep. 30 tablet 1   No facility-administered medications prior to visit.     Allergies  Allergen Reactions  . Benazepril Anaphylaxis  angioedema  . Codeine Nausea And Vomiting  . Morphine     Redness and hives noted post IV admin on 07/05/17  . Ondansetron Hcl     Redness and hives post IV admin on 07/05/17    Review of Systems  Constitutional: Positive for malaise/fatigue. Negative for fever.  HENT: Negative for congestion.   Eyes: Negative for blurred vision.  Respiratory: Negative for shortness of breath.   Cardiovascular: Negative for chest pain, palpitations and leg swelling.  Gastrointestinal: Positive for abdominal pain, diarrhea and heartburn. Negative for blood in stool, nausea and vomiting.  Genitourinary: Negative for dysuria and frequency.  Musculoskeletal: Positive for myalgias. Negative for falls.  Skin: Negative for rash.  Neurological: Negative for dizziness, loss of consciousness and headaches.  Endo/Heme/Allergies: Negative for environmental allergies.  Psychiatric/Behavioral: Negative for depression. The patient is not nervous/anxious.        Objective:    Physical Exam  BP 140/90   Pulse 95   Temp 97.7 F (36.5 C) (Oral)   Resp 18   Wt 175 lb 6.4 oz (79.6 kg)   SpO2 100%   BMI 28.31 kg/m  Wt Readings from Last 3 Encounters:  10/11/17 175 lb 6.4 oz (79.6 kg)  09/14/17 176 lb 3.2 oz (79.9 kg)  08/20/17 172 lb 3.2 oz (78.1 kg)   BP Readings from Last 3 Encounters:  10/11/17 140/90  09/14/17 122/64  08/20/17 122/82   Physical Exam   Gen: WNWD, NAD HEENT:n neck supple, no thyromegaly, PERRLA, MMM,  CV: RRR Pulm: CTA BL Abd: soft, NTND, +BS, no masses Extremities: No C/C/E Psych: flat affect, alert and oriented x 3   Immunization History  Administered Date(s) Administered  . Influenza Whole 09/09/2009, 09/12/2013, 07/12/2015  . Influenza,inj,Quad PF,6+ Mos 07/14/2016,  07/27/2017  . Influenza-Unspecified 08/23/2014, 07/06/2015  . Pneumococcal Conjugate-13 07/12/2015  . Pneumococcal Polysaccharide-23 09/06/2014, 07/12/2015  . Pneumococcal-Unspecified 07/06/2015  . Tdap 12/01/2010, 10/11/2015  . Zoster 10/11/2015, 09/15/2016    Health Maintenance  Topic Date Due  . MAMMOGRAM  12/23/2017  . PAP SMEAR  10/10/2018  . COLONOSCOPY  10/26/2019  . TETANUS/TDAP  10/10/2025  . INFLUENZA VACCINE  Completed  . Hepatitis C Screening  Completed  . HIV Screening  Completed    Lab Results  Component Value Date   WBC 3.6 (L) 08/20/2017   HGB 11.4 (L) 08/20/2017   HCT 34.7 (L) 08/20/2017   PLT 156.0 08/20/2017   GLUCOSE 95 08/20/2017   CHOL 163 12/31/2016   TRIG 65.0 12/31/2016   HDL 48.00 12/31/2016   LDLCALC 102 (H) 12/31/2016   ALT 23 08/20/2017   AST 44 (H) 08/20/2017   NA 138 08/20/2017   K 3.6 08/20/2017   CL 103 08/20/2017   CREATININE 0.67 08/20/2017   BUN 14 08/20/2017   CO2 28 08/20/2017   TSH 1.34 12/31/2016    Lab Results  Component Value Date   TSH 1.34 12/31/2016   Lab Results  Component Value Date   WBC 3.6 (L) 08/20/2017   HGB 11.4 (L) 08/20/2017   HCT 34.7 (L) 08/20/2017   MCV 88.0 08/20/2017   PLT 156.0 08/20/2017   Lab Results  Component Value Date   NA 138 08/20/2017   K 3.6 08/20/2017   CHLORIDE 108 12/31/2016   CO2 28 08/20/2017   GLUCOSE 95 08/20/2017   BUN 14 08/20/2017   CREATININE 0.67 08/20/2017   BILITOT 0.4 08/20/2017   ALKPHOS 37 (L) 08/20/2017   AST 44 (H) 08/20/2017  ALT 23 08/20/2017   PROT 9.5 (H) 08/20/2017   ALBUMIN 3.5 08/20/2017   CALCIUM 8.8 08/20/2017   ANIONGAP 9 12/31/2016   EGFR >90 12/31/2016   GFR 114.24 08/20/2017   Lab Results  Component Value Date   CHOL 163 12/31/2016   Lab Results  Component Value Date   HDL 48.00 12/31/2016   Lab Results  Component Value Date   LDLCALC 102 (H) 12/31/2016   Lab Results  Component Value Date   TRIG 65.0 12/31/2016   Lab Results    Component Value Date   CHOLHDL 3 12/31/2016   No results found for: HGBA1C       Assessment & Plan:   Problem List Items Addressed This Visit    Monoclonal gammopathy of unknown significance (MGUS) (Chronic)    Has follow up with oncology this week. No new concerns      GERD (gastroesophageal reflux disease)    Avoid offending foods, start probiotics. Do not eat large meals in late evening and consider raising head of bed. Describes a burning sensation, did not try the Carafate is given a new prescription to try again      Relevant Medications   pantoprazole (PROTONIX) 40 MG tablet   sucralfate (CARAFATE) 1 g tablet   Hypertension    Well controlled, no changes to meds. Encouraged heart healthy diet such as the DASH diet and exercise as tolerated.       Relevant Medications   carvedilol (COREG) 25 MG tablet   amLODipine (NORVASC) 10 MG tablet   triamterene-hydrochlorothiazide (MAXZIDE-25) 37.5-25 MG tablet   furosemide (LASIX) 20 MG tablet   aspirin EC 81 MG tablet   AP (abdominal pain)   C. difficile colitis    Had a flare when she ran out of Vancomycin she presented to [facility] and was found to be CDiff positive again after hospitalization is now back on the Vancomycin 4 x a day and has a follow up with gastroenterology Dr Dorrene German. Is more formed again but she is having increased abdominal pain recently as well. Hydrocodone/APAP 5/325 for severe abdominal pain which she has used previously with good results.        Other Visit Diagnoses    Arteritis ([place])       Relevant Medications   carvedilol (COREG) 25 MG tablet   amLODipine (NORVASC) 10 MG tablet   triamterene-hydrochlorothiazide (MAXZIDE-25) 37.5-25 MG tablet   furosemide (LASIX) 20 MG tablet   aspirin EC 81 MG tablet      I have discontinued Tami [name] baclofen, hyoscyamine, and dicyclomine. I have also changed her furosemide. Additionally, I am having her start on HYDROcodone-acetaminophen. Lastly, I am  having her maintain her docusate sodium, Vitamin D3, Multiple Vitamins-Minerals (CENTRUM SILVER ADULT 50+ PO), modafinil, zolpidem, ergocalciferol, silver sulfADIAZINE, gabapentin, tiZANidine, conjugated estrogens, acetaminophen, metoCLOPramide, famotidine, escitalopram, vancomycin, carvedilol, topiramate, amLODipine, triamterene-hydrochlorothiazide, fluticasone, albuterol, aspirin EC, pantoprazole, potassium chloride SA, and sucralfate.  Meds ordered this encounter  Medications  . carvedilol (COREG) 25 MG tablet    Sig: Take 1 tablet (25 mg total) by mouth 2 (two) times daily with a meal.    Dispense:  180 tablet    Refill:  1  . topiramate (TOPAMAX) 25 MG tablet    Sig: Take 1 tablet (25 mg total) by mouth 2 (two) times daily.    Dispense:  60 tablet    Refill:  2  . amLODipine (NORVASC) 10 MG tablet    Sig: Take 1 tablet (10 mg  total) by mouth daily.    Dispense:  90 tablet    Refill:  1  . triamterene-hydrochlorothiazide (MAXZIDE-25) 37.5-25 MG tablet    Sig: Take 1 tablet by mouth daily.    Dispense:  90 tablet    Refill:  1  . furosemide (LASIX) 20 MG tablet    Sig: Take 1 tablet (20 mg total) by mouth daily as needed (WT gain>3# in 24 HR).    Dispense:  90 tablet    Refill:  1  . fluticasone (FLONASE) 50 MCG/ACT nasal spray    Sig: Place 2 sprays into both nostrils daily.    Dispense:  16 g    Refill:  6  . DISCONTD: potassium chloride SA (KLOR-CON M20) 20 MEQ tablet    Sig: Take 1 tablet (20 mEq total) by mouth daily.    Dispense:  30 tablet    Refill:  1  . DISCONTD: pantoprazole (PROTONIX) 40 MG tablet    Sig: Take 1 tablet (40 mg total) by mouth 2 (two) times daily.    Dispense:  180 tablet    Refill:  1  . albuterol (PROVENTIL HFA;VENTOLIN HFA) 108 (90 Base) MCG/ACT inhaler    Sig: Inhale 2 puffs into the lungs every 6 (six) hours as needed for wheezing or shortness of breath.    Dispense:  1 Inhaler    Refill:  1  . aspirin EC 81 MG tablet    Sig: Take 1 tablet  (81 mg total) by mouth daily.    Dispense:  30 tablet    Refill:  1  . HYDROcodone-acetaminophen (NORCO) 5-325 MG tablet    Sig: Take 1 tablet by mouth 3 (three) times daily as needed for moderate pain.    Dispense:  15 tablet    Refill:  0  . pantoprazole (PROTONIX) 40 MG tablet    Sig: Take 1 tablet (40 mg total) by mouth 2 (two) times daily.    Dispense:  180 tablet    Refill:  1  . potassium chloride SA (KLOR-CON M20) 20 MEQ tablet    Sig: Take 1 tablet (20 mEq total) by mouth daily.    Dispense:  30 tablet    Refill:  1  . sucralfate (CARAFATE) 1 g tablet    Sig: Take 1 tablet (1 g total) by mouth 4 (four) times daily -  with meals and at bedtime. prn    Dispense:  120 tablet    Refill:  1    CMA served as Education administrator during this visit. History, Physical and Plan performed by medical provider. Documentation and orders reviewed and attested to.  Penni Homans, MD

## 2017-10-11 NOTE — Assessment & Plan Note (Signed)
Has follow up with oncology this week. No new concerns

## 2017-10-11 NOTE — Patient Instructions (Addendum)
BRAT diet, bananas, applesauce, Rice and toast Bland Diet A bland diet consists of foods that do not have a lot of fat or fiber. Foods without fat or fiber are easier for the body to digest. They are also less likely to irritate your mouth, throat, stomach, and other parts of your gastrointestinal tract. A bland diet is sometimes called a BRAT diet. What is my plan? Your health care provider or dietitian may recommend specific changes to your diet to prevent and treat your symptoms, such as:  Eating small meals often.  Cooking food until it is soft enough to chew easily.  Chewing your food well.  Drinking fluids slowly.  Not eating foods that are very spicy, sour, or fatty.  Not eating citrus fruits, such as oranges and grapefruit.  What do I need to know about this diet?  Eat a variety of foods from the bland diet food list.  Do not follow a bland diet longer than you have to.  Ask your health care provider whether you should take vitamins. What foods can I eat? Grains  Hot cereals, such as cream of wheat. Bread, crackers, or tortillas made from refined white flour. Rice. Vegetables Canned or cooked vegetables. Mashed or boiled potatoes. Fruits Bananas. Applesauce. Other types of cooked or canned fruit with the skin and seeds removed, such as canned peaches or pears. Meats and Other Protein Sources Scrambled eggs. Creamy peanut butter or other nut butters. Lean, well-cooked meats, such as chicken or fish. Tofu. Soups or broths. Dairy Low-fat dairy products, such as milk, cottage cheese, or yogurt. Beverages Water. Herbal tea. Apple juice. Sweets and Desserts Pudding. Custard. Fruit gelatin. Ice cream. Fats and Oils Mild salad dressings. Canola or olive oil. The items listed above may not be a complete list of allowed foods or beverages. Contact your dietitian for more options. What foods are not recommended? Foods and ingredients that are often not recommended  include:  Spicy foods, such as hot sauce or salsa.  Fried foods.  Sour foods, such as pickled or fermented foods.  Raw vegetables or fruits, especially citrus or berries.  Caffeinated drinks.  Alcohol.  Strongly flavored seasonings or condiments.  The items listed above may not be a complete list of foods and beverages that are not allowed. Contact your dietitian for more information. This information is not intended to replace advice given to you by your health care provider. Make sure you discuss any questions you have with your health care provider. Document Released: 02/17/2016 Document Revised: 04/02/2016 Document Reviewed: 11/07/2014 Elsevier Interactive Patient Education  2018 [employer].

## 2017-10-11 NOTE — Assessment & Plan Note (Signed)
Well controlled, no changes to meds. Encouraged heart healthy diet such as the DASH diet and exercise as tolerated.  °

## 2017-10-18 ENCOUNTER — Other Ambulatory Visit: Payer: Medicaid Other

## 2017-10-18 ENCOUNTER — Ambulatory Visit: Payer: Medicaid Other | Admitting: Hematology & Oncology

## 2017-10-19 ENCOUNTER — Ambulatory Visit: Payer: Self-pay | Admitting: *Deleted

## 2017-10-19 ENCOUNTER — Emergency Department (HOSPITAL_BASED_OUTPATIENT_CLINIC_OR_DEPARTMENT_OTHER)
Admission: EM | Admit: 2017-10-19 | Discharge: 2017-10-19 | Disposition: A | Discharge status: Home / Self Care | Payer: Medicaid Other | Attending: Emergency Medicine | Admitting: Emergency Medicine

## 2017-10-19 ENCOUNTER — Telehealth: Payer: Self-pay | Admitting: Family Medicine

## 2017-10-19 ENCOUNTER — Encounter (HOSPITAL_BASED_OUTPATIENT_CLINIC_OR_DEPARTMENT_OTHER): Payer: Self-pay

## 2017-10-19 ENCOUNTER — Other Ambulatory Visit: Payer: Self-pay

## 2017-10-19 DIAGNOSIS — N3000 Acute cystitis without hematuria: Secondary | ICD-10-CM | POA: Diagnosis not present

## 2017-10-19 DIAGNOSIS — I119 Hypertensive heart disease without heart failure: Secondary | ICD-10-CM | POA: Insufficient documentation

## 2017-10-19 DIAGNOSIS — R350 Frequency of micturition: Secondary | ICD-10-CM | POA: Diagnosis present

## 2017-10-19 DIAGNOSIS — Z79899 Other long term (current) drug therapy: Secondary | ICD-10-CM | POA: Insufficient documentation

## 2017-10-19 DIAGNOSIS — Z8673 Personal history of transient ischemic attack (TIA), and cerebral infarction without residual deficits: Secondary | ICD-10-CM | POA: Diagnosis not present

## 2017-10-19 DIAGNOSIS — N3090 Cystitis, unspecified without hematuria: Secondary | ICD-10-CM

## 2017-10-19 HISTORY — DX: Enterocolitis due to Clostridium difficile, not specified as recurrent: A04.72

## 2017-10-19 LAB — URINALYSIS, MICROSCOPIC (REFLEX)

## 2017-10-19 LAB — URINALYSIS, ROUTINE W REFLEX MICROSCOPIC
Bilirubin Urine: NEGATIVE
GLUCOSE, UA: 100 mg/dL — AB
HGB URINE DIPSTICK: NEGATIVE
Ketones, ur: NEGATIVE mg/dL
Nitrite: POSITIVE — AB
PH: 8.5 — AB (ref 5.0–8.0)
PROTEIN: 30 mg/dL — AB
Specific Gravity, Urine: 1.01 (ref 1.005–1.030)

## 2017-10-19 MED ORDER — CEPHALEXIN 500 MG PO CAPS: 500.0000 mg | ORAL_CAPSULE | Freq: Four times a day (QID) | ORAL | 0 refills | Status: DC

## 2017-10-19 NOTE — ED Triage Notes (Signed)
C/o "UTI"-urinary freq, lower back pain x 3 days-NAD-steady gait

## 2017-10-19 NOTE — ED Provider Notes (Signed)
[facility] EMERGENCY DEPARTMENT Provider Note   CSN: [id number] Arrival date & time: 10/19/17  1520     History   Chief Complaint Chief Complaint  Patient presents with  . Urinary Frequency    HPI Tami [name] is a 61 y.o. female.  Patient recently admitted for treatment of c-diff. She is currently taking vancomycin. She reports urinary frequency, lower back pain x 3 days. She denies abdominal pain, nausea, and vomiting. She reports pain and burning with urination, has been taking OTC AZO with improvement.   The history is provided by the patient. No language interpreter was used.  Urinary Frequency  This is a new problem. The current episode started more than 2 days ago. The problem has been gradually worsening.    Past Medical History:  Diagnosis Date  . Abnormal SPEP 07/26/2016  . Arthritis    knees, hands  . Back pain 07/14/2016  . Bowel obstruction ([place]) 11/2014  . C. difficile diarrhea   . CHF (congestive heart failure) ([place])   . Congestive heart failure ([place]) 02/24/2015   S/p pacemaker  . Depression   . Elevated sed rate 08/15/2017  . Epigastric pain 03/22/2017  . GERD (gastroesophageal reflux disease)   . Hyperlipidemia   . Hypertension   . Low back pain 07/14/2016  . 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 Myalgia 12/31/2016  . Obstructive sleep apnea 03/31/2015  . SOB (shortness of breath) 04/09/2016  . Stroke [facility])    TIAs    Patient Active Problem List   Diagnosis Date Noted  . C. difficile colitis 09/15/2017  . Hypokalemia 08/20/2017  . Anemia 08/20/2017  . Elevated sed rate 08/15/2017  . 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 Epigastric pain 03/22/2017  . Myalgia 12/31/2016  . 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 SOB (shortness of breath) 07/26/2016  . Abnormal SPEP 07/26/2016  . Low back pain 07/14/2016  . Insomnia 12/29/2015  . Tension headache  10/21/2015  . Muscle spasms of neck 10/18/2015  . Dysuria 10/13/2015  . Sinusitis 07/07/2015  . AP (abdominal pain) 07/07/2015  . Cardiomyopathy ([place]) 04/19/2015  . Chest wall pain 04/19/2015  . Obstructive sleep apnea 03/31/2015  . Congestive heart failure ([place]) 02/24/2015  . Hyperlipidemia   . Hypertension   . Depression   . Stroke ([place])   . GERD (gastroesophageal reflux disease)   . Bowel obstruction ([place]) 11/09/2014    Past Surgical History:  Procedure Laterality Date  . ABDOMINAL HYSTERECTOMY     menorraghia, 2006, total  . CHOLECYSTECTOMY    . KNEE SURGERY     right, repair torn torn cartialage  . PACEMAKER INSERTION  08/2014  . TONSILLECTOMY    . TUBAL LIGATION      OB History    No data available       Home Medications    Prior to Admission medications   Medication Sig Start Date End Date Taking? Authorizing Provider  acetaminophen (TYLENOL) 325 MG tablet Take 650 mg by mouth. 06/07/17   [provider]  albuterol (PROVENTIL HFA;VENTOLIN HFA) 108 (90 Base) MCG/ACT inhaler Inhale 2 puffs into the lungs every 6 (six) hours as needed for wheezing or shortness of breath. 10/11/17   Mosie Lukes, MD  amLODipine (NORVASC) 10 MG tablet Take 1 tablet (10 mg total) by mouth daily. 10/11/17 10/11/18  Mosie Lukes, MD  aspirin EC 81 MG tablet Take 1 tablet (81 mg  total) by mouth daily. 10/11/17   Mosie Lukes, MD  carvedilol (COREG) 25 MG tablet Take 1 tablet (25 mg total) by mouth 2 (two) times daily with a meal. 10/11/17   Mosie Lukes, MD  Cholecalciferol (VITAMIN D3) 5000 UNITS CAPS Take 1 capsule by mouth daily.    [provider]  conjugated estrogens (PREMARIN) vaginal cream Place vaginally. 01/31/14   [provider]  docusate sodium (COLACE) 50 MG capsule Take 50 mg by mouth 2 (two) times daily.    [provider]  ergocalciferol (VITAMIN D2) 50000 units capsule Take 1 capsule (50,000 Units total) by mouth once a week.  04/09/16   Mosie Lukes, MD  escitalopram (LEXAPRO) 10 MG tablet Take 1 tablet (10 mg total) by mouth daily. 07/27/17   Mosie Lukes, MD  famotidine (PEPCID) 40 MG tablet Take 1 tablet (40 mg total) by mouth at bedtime. 06/11/17   Mosie Lukes, MD  fluticasone (FLONASE) 50 MCG/ACT nasal spray Place 2 sprays into both nostrils daily. 10/11/17   Mosie Lukes, MD  furosemide (LASIX) 20 MG tablet Take 1 tablet (20 mg total) by mouth daily as needed (WT gain>3# in 24 HR). 10/11/17   Mosie Lukes, MD  gabapentin (NEURONTIN) 300 MG capsule Take 1 capsule (300 mg total) by mouth 3 (three) times daily. 07/14/16   Mosie Lukes, MD  HYDROcodone-acetaminophen (NORCO) 5-325 MG tablet Take 1 tablet by mouth 3 (three) times daily as needed for moderate pain. 10/11/17   Mosie Lukes, MD  metoCLOPramide (REGLAN) 10 MG tablet Take 1 tablet (10 mg total) by mouth 4 (four) times daily -  before meals and at bedtime. 06/11/17   [name], MD  modafinil (PROVIGIL) 100 MG tablet Take 1 tablet (100 mg total) by mouth daily. 10/11/15   Mosie Lukes, MD  Multiple Vitamins-Minerals (CENTRUM SILVER ADULT 50+ PO) Take by mouth.    [provider]  pantoprazole (PROTONIX) 40 MG tablet Take 1 tablet (40 mg total) by mouth 2 (two) times daily. 10/11/17   Mosie Lukes, MD  potassium chloride SA (KLOR-CON M20) 20 MEQ tablet Take 1 tablet (20 mEq total) by mouth daily. 10/11/17   Mosie Lukes, MD  silver sulfADIAZINE (SILVADENE) 1 % cream Apply 1 application topically daily. 04/15/16   Mosie Lukes, MD  sucralfate (CARAFATE) 1 g tablet Take 1 tablet (1 g total) by mouth 4 (four) times daily -  with meals and at bedtime. prn 10/11/17   Mosie Lukes, MD  tiZANidine (ZANAFLEX) 4 MG tablet Take 0.5-1 tablets (2-4 mg total) by mouth at bedtime as needed for muscle spasms. 03/22/17   Mosie Lukes, MD  topiramate (TOPAMAX) 25 MG tablet Take 1 tablet (25 mg total) by mouth 2 (two) times daily. 10/11/17    Mosie Lukes, MD  triamterene-hydrochlorothiazide (MAXZIDE-25) 37.5-25 MG tablet Take 1 tablet by mouth daily. 10/11/17   Mosie Lukes, MD  vancomycin (VANCOCIN) 125 MG capsule Take 1 capsule (125 mg total) 4 (four) times daily by mouth. For (7) seven days 09/21/17   Mosie Lukes, MD  zolpidem (AMBIEN) 10 MG tablet Take 0.5-1 tablets (5-10 mg total) by mouth at bedtime as needed for sleep. 12/23/15 01/22/16  Mosie Lukes, MD    Family History Family History  Problem Relation Age of Onset  . Diabetes Mother   . Hypertension Mother   . Heart disease Mother  s/p 1 stent  . Hyperlipidemia Mother   . Arthritis Mother   . Cancer Father        COLON  . Colon cancer Father 40  . Irritable bowel syndrome Sister   . Hyperlipidemia Daughter   . Hypertension Daughter   . Hypertension Maternal Grandmother   . Arthritis Maternal Grandmother   . Heart disease Maternal Grandfather        MI  . Hypertension Maternal Grandfather   . Arthritis Maternal Grandfather   . Hypertension Son     Social History Social History   Tobacco Use  . Smoking status: Never Smoker  . Smokeless tobacco: Never Used  Substance Use Topics  . Alcohol use: No    Alcohol/week: 0.0 oz  . Drug use: No     Allergies   Benazepril; Codeine; Morphine; and Ondansetron hcl   Review of Systems Review of Systems  Gastrointestinal: Negative for nausea and vomiting.  Genitourinary: Positive for dysuria and frequency.  Musculoskeletal: Positive for back pain.  All other systems reviewed and are negative.    Physical Exam Updated Vital Signs BP (!) 161/93 (BP Location: Left Arm)   Pulse (!) 102   Temp 97.6 F (36.4 C) (Oral)   Resp 20   Ht 5\' 6"  (1.676 m)   Wt 78.9 kg (174 lb)   SpO2 94%   BMI 28.08 kg/m   Physical Exam  Constitutional: She is oriented to person, place, and time. She appears well-developed and well-nourished.  HENT:  Head: Atraumatic.  Eyes: Conjunctivae are normal.    Neck: Neck supple.  Cardiovascular: Normal rate and regular rhythm.  Pulmonary/Chest: Effort normal and breath sounds normal.  Abdominal: Soft. Bowel sounds are normal. There is CVA tenderness.  Musculoskeletal: Normal range of motion. She exhibits edema.  Lymphadenopathy:    She has no cervical adenopathy.  Neurological: She is alert and oriented to person, place, and time.  Skin: Skin is warm and dry.  Psychiatric: She has a normal mood and affect.  Nursing note and vitals reviewed.    ED Treatments / Results  Labs (all labs ordered are listed, but only abnormal results are displayed) Labs Reviewed  URINALYSIS, ROUTINE W REFLEX MICROSCOPIC - Abnormal; Notable for the following components:      Result Value   Color, Urine ORANGE (*)    APPearance CLOUDY (*)    pH 8.5 (*)    Glucose, UA 100 (*)    Protein, ur 30 (*)    Nitrite POSITIVE (*)    Leukocytes, UA TRACE (*)    All other components within normal limits  URINALYSIS, MICROSCOPIC (REFLEX) - Abnormal; Notable for the following components:   Bacteria, UA MANY (*)    Squamous Epithelial / LPF 0-5 (*)    All other components within normal limits    EKG  EKG Interpretation None       Radiology No results found.  Procedures Procedures (including critical care time)  Medications Ordered in ED Medications - No data to display   Initial Impression / Assessment and Plan / ED Course  I have reviewed the triage vital signs and the nursing notes.  Pertinent labs & imaging results that were available during my care of the patient were reviewed by me and considered in my medical decision making (see chart for details).     Pt diagnosed with a UTI. Pt is afebrile, without tachycardia, hypotension, or other signs of serious infection.  Pt to be dc home with  antibiotics and instructions to follow up with PCP if symptoms persist. Discussed return precautions. Pt appears safe for discharge.  Final Clinical  Impressions(s) / ED Diagnoses   Final diagnoses:  Cystitis    ED Discharge Orders        Ordered    cephALEXin (KEFLEX) 500 MG capsule  4 times daily     10/19/17 1907       Etta Quill, NP 10/19/17 1910    Blanchie Dessert, MD 10/20/17 [phone]
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
## 2017-10-19 NOTE — Telephone Encounter (Signed)
Copied from [place] [phone]. Topic: Appointment Scheduling - Scheduling Inquiry for Clinic >> Oct 06, 2017 12:06 PM Aurelio Brash B wrote: Reason for CRM: Pt needs to schedule a hospital follow up,  I have been on phone over 29 minutes trying to get this scheduled and skyping with office,  I was unable to get this apt scheduled for her,  we were trying to get her scheduled 12/6 at 1130 and was getting hard stop,  kim told me to refresh and try again and it still didn't work.  No one has replied to my skype messages since and I could not get an answer on phone at office.  Ivonne  [facility] [phone] can leave apt details in a vm  Please schedule an apt for the pt >> Oct 06, 2017  2:22 PM Selina Cooley wrote: Appointment was made for patient  >> Oct 19, 2017 10:25 AM Oneta Rack wrote: Relation to pt: self Call back number: [phone]  Pharmacy: [facility], [place] [phone] (Phone) [phone] (Fax)    Reason for call:  Patient scheduled ED follow up with PCP for 10/21/17, patient states she was only seen in the ED.   Patient experiencing UTI symtopms due to the medication she's currently on, urgency to urinate but not voiding, and burning while urinating, please advise

## 2017-10-19 NOTE — Telephone Encounter (Signed)
Left VM to call the office back with her concerns for UTI.

## 2017-10-19 NOTE — Telephone Encounter (Signed)
Copied from [place]. Topic: Quick Communication - See Telephone Encounter >> Oct 19, 2017  1:34 PM Bea Graff, NT wrote: CRM for notification. See Telephone encounter for: Patient requesting a rx for a UTI. She uses [facility] [address] [employer].   10/19/17.

## 2017-10-20 NOTE — Telephone Encounter (Signed)
Pt seen at ED on 10/19/2017.

## 2017-10-20 NOTE — Telephone Encounter (Signed)
Patient stated she no longer needs the medication

## 2017-10-20 NOTE — Telephone Encounter (Signed)
Pt seen in ED on 10/19/2017.

## 2017-10-21 ENCOUNTER — Ambulatory Visit: Payer: Medicaid Other | Admitting: Family Medicine

## 2017-10-21 LAB — URINE CULTURE

## 2017-10-25 ENCOUNTER — Ambulatory Visit (HOSPITAL_BASED_OUTPATIENT_CLINIC_OR_DEPARTMENT_OTHER)
Admission: RE | Admit: 2017-10-25 | Discharge: 2017-10-25 | Disposition: A | Discharge status: Home / Self Care | Payer: Medicaid Other | Source: Ambulatory Visit | Attending: Family Medicine | Admitting: Family Medicine

## 2017-10-25 ENCOUNTER — Encounter: Payer: Self-pay | Admitting: Family Medicine

## 2017-10-25 ENCOUNTER — Ambulatory Visit (INDEPENDENT_AMBULATORY_CARE_PROVIDER_SITE_OTHER): Payer: Medicaid Other | Admitting: Family Medicine

## 2017-10-25 DIAGNOSIS — I10 Essential (primary) hypertension: Secondary | ICD-10-CM

## 2017-10-25 DIAGNOSIS — R778 Other specified abnormalities of plasma proteins: Secondary | ICD-10-CM

## 2017-10-25 DIAGNOSIS — N3090 Cystitis, unspecified without hematuria: Secondary | ICD-10-CM | POA: Diagnosis not present

## 2017-10-25 DIAGNOSIS — R5383 Other fatigue: Secondary | ICD-10-CM | POA: Diagnosis not present

## 2017-10-25 DIAGNOSIS — D649 Anemia, unspecified: Secondary | ICD-10-CM

## 2017-10-25 DIAGNOSIS — A0472 Enterocolitis due to Clostridium difficile, not specified as recurrent: Secondary | ICD-10-CM | POA: Diagnosis not present

## 2017-10-25 DIAGNOSIS — R0789 Other chest pain: Secondary | ICD-10-CM | POA: Insufficient documentation

## 2017-10-25 DIAGNOSIS — R918 Other nonspecific abnormal finding of lung field: Secondary | ICD-10-CM | POA: Insufficient documentation

## 2017-10-25 NOTE — Assessment & Plan Note (Signed)
Follows with oncology

## 2017-10-25 NOTE — Assessment & Plan Note (Signed)
Check with [facility]

## 2017-10-25 NOTE — Assessment & Plan Note (Signed)
Well controlled, no changes to meds. Encouraged heart healthy diet such as the DASH diet and exercise as tolerated.  °

## 2017-10-25 NOTE — Patient Instructions (Signed)

## 2017-10-25 NOTE — Progress Notes (Signed)
Subjective:  I acted as a Education administrator for [employer]. [name], [place]   [name], female    DOB: April 28, 1956, 61 y.o.   MRN: [id number]  Chief Complaint  Patient presents with  . Follow-up    HPI  Patient is in today for follow up visit after having to make another trip to the emergency room.  She developed symptoms of cystitis while her office was closed.  She had dysuria and frequency.  Was seen in the ER and placed on Keflex.  She notes her dysuria is greatly improved and she has only mild frequency at this time.  Has ongoing trouble with chronic pain including back pain but it is not worse than her baseline.  Her C. difficile appears to still be well treated on the vancomycin despite her short course of Keflex.  No other new or acute complaints noted. Denies CP/palp/SOB/HA/congestion/fevers. Taking meds as prescribed  Patient Care Team: Mosie Lukes, MD as PCP - General (Family Medicine)   Past Medical History:  Diagnosis Date  . Abnormal SPEP 07/26/2016  . Arthritis    knees, hands  . Back pain 07/14/2016  . Bowel obstruction ([place]) 11/2014  . C. difficile diarrhea   . CHF (congestive heart failure) ([place])   . Congestive heart failure ([place]) 02/24/2015   S/p pacemaker  . Depression   . Elevated sed rate 08/15/2017  . Epigastric pain 03/22/2017  . GERD (gastroesophageal reflux disease)   . Hyperlipidemia   . Hypertension   . Low back pain 07/14/2016  . 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 Myalgia 12/31/2016  . Obstructive sleep apnea 03/31/2015  . SOB (shortness of breath) 04/09/2016  . Stroke [facility])    TIAs    Past Surgical History:  Procedure Laterality Date  . ABDOMINAL HYSTERECTOMY     menorraghia, 2006, total  . CHOLECYSTECTOMY    . KNEE SURGERY     right, repair torn torn cartialage  . PACEMAKER INSERTION  08/2014  . TONSILLECTOMY    . TUBAL LIGATION      Family History  Problem Relation Age of Onset  . Diabetes Mother   .  Hypertension Mother   . Heart disease Mother        s/p 1 stent  . Hyperlipidemia Mother   . Arthritis Mother   . Cancer Father        COLON  . Colon cancer Father 44  . Irritable bowel syndrome Sister   . Hyperlipidemia Daughter   . Hypertension Daughter   . Hypertension Maternal Grandmother   . Arthritis Maternal Grandmother   . Heart disease Maternal Grandfather        MI  . Hypertension Maternal Grandfather   . Arthritis Maternal Grandfather   . Hypertension Son     Social History   Socioeconomic History  . Marital status: Single    Spouse name: [name]  . Number of children: [name]  . Years of education: [name]  . Highest education level: [name]  Social Needs  . Financial resource strain: [name]  . Food insecurity - worry: [name]  . Food insecurity - inability: [name]  . Transportation needs - medical: [name]  . Transportation needs - non-medical: [name]  Occupational History  . [name]  Tobacco Use  . Smoking status: Never Smoker  . Smokeless tobacco: Never Used  Substance and Sexual  Activity  . Alcohol use: No    Alcohol/week: 0.0 oz  . Drug use: No  . Sexual activity: [name]  Other Topics Concern  . [name]  Social History Narrative  . [name]    Outpatient Medications Prior to Visit  Medication Sig Dispense Refill  . acetaminophen (TYLENOL) 325 MG tablet Take 650 mg by mouth.    Marland Kitchen albuterol (PROVENTIL HFA;VENTOLIN HFA) 108 (90 Base) MCG/ACT inhaler Inhale 2 puffs into the lungs every 6 (six) hours as needed for wheezing or shortness of breath. 1 Inhaler 1  . amLODipine (NORVASC) 10 MG tablet Take 1 tablet (10 mg total) by mouth daily. 90 tablet 1  . aspirin EC 81 MG tablet Take 1 tablet (81 mg total) by mouth daily. 30 tablet 1  . carvedilol (COREG) 25 MG tablet Take 1 tablet (25 mg total) by mouth 2 (two) times daily with a meal. 180 tablet 1  . cephALEXin (KEFLEX) 500 MG capsule Take 1  capsule (500 mg total) by mouth 4 (four) times daily. 20 capsule 0  . Cholecalciferol (VITAMIN D3) 5000 UNITS CAPS Take 1 capsule by mouth daily.    Marland Kitchen conjugated estrogens (PREMARIN) vaginal cream Place vaginally.    . docusate sodium (COLACE) 50 MG capsule Take 50 mg by mouth 2 (two) times daily.    . ergocalciferol (VITAMIN D2) 50000 units capsule Take 1 capsule (50,000 Units total) by mouth once a week. 12 capsule 1  . escitalopram (LEXAPRO) 10 MG tablet Take 1 tablet (10 mg total) by mouth daily. 30 tablet 3  . famotidine (PEPCID) 40 MG tablet Take 1 tablet (40 mg total) by mouth at bedtime. 90 tablet 1  . fluticasone (FLONASE) 50 MCG/ACT nasal spray Place 2 sprays into both nostrils daily. 16 g 6  . furosemide (LASIX) 20 MG tablet Take 1 tablet (20 mg total) by mouth daily as needed (WT gain>3# in 24 HR). 90 tablet 1  . gabapentin (NEURONTIN) 300 MG capsule Take 1 capsule (300 mg total) by mouth 3 (three) times daily. 90 capsule 3  . HYDROcodone-acetaminophen (NORCO) 5-325 MG tablet Take 1 tablet by mouth 3 (three) times daily as needed for moderate pain. 15 tablet 0  . metoCLOPramide (REGLAN) 10 MG tablet Take 1 tablet (10 mg total) by mouth 4 (four) times daily -  before meals and at bedtime. 60 tablet 4  . modafinil (PROVIGIL) 100 MG tablet Take 1 tablet (100 mg total) by mouth daily. 30 tablet 5  . Multiple Vitamins-Minerals (CENTRUM SILVER ADULT 50+ PO) Take by mouth.    . pantoprazole (PROTONIX) 40 MG tablet Take 1 tablet (40 mg total) by mouth 2 (two) times daily. 180 tablet 1  . potassium chloride SA (KLOR-CON M20) 20 MEQ tablet Take 1 tablet (20 mEq total) by mouth daily. 30 tablet 1  . silver sulfADIAZINE (SILVADENE) 1 % cream Apply 1 application topically daily. 50 g 1  . sucralfate (CARAFATE) 1 g tablet Take 1 tablet (1 g total) by mouth 4 (four) times daily -  with meals and at bedtime. prn 120 tablet 1  . tiZANidine (ZANAFLEX) 4 MG tablet Take 0.5-1 tablets (2-4 mg total) by  mouth at bedtime as needed for muscle spasms. 30 tablet 1  . topiramate (TOPAMAX) 25 MG tablet Take 1 tablet (25 mg total) by mouth 2 (two) times daily. 60 tablet 2  . triamterene-hydrochlorothiazide (MAXZIDE-25) 37.5-25 MG tablet Take 1 tablet by mouth daily. 90 tablet 1  .  vancomycin (VANCOCIN) 125 MG capsule Take 1 capsule (125 mg total) 4 (four) times daily by mouth. For (7) seven days 28 capsule 0  . zolpidem (AMBIEN) 10 MG tablet Take 0.5-1 tablets (5-10 mg total) by mouth at bedtime as needed for sleep. 30 tablet 1   No facility-administered medications prior to visit.     Allergies  Allergen Reactions  . Benazepril Anaphylaxis    angioedema  . Codeine Nausea And Vomiting  . Morphine     Redness and hives noted post IV admin on 07/05/17  . Ondansetron Hcl     Redness and hives post IV admin on 07/05/17    Review of Systems  Constitutional: Negative for fever and malaise/fatigue.  HENT: Negative for congestion.   Eyes: Negative for blurred vision.  Respiratory: Negative for shortness of breath.   Cardiovascular: Negative for chest pain, palpitations and leg swelling.  Gastrointestinal: Positive for abdominal pain. Negative for blood in stool and nausea.  Genitourinary: Positive for frequency. Negative for dysuria.  Musculoskeletal: Positive for back pain. Negative for falls.  Skin: Negative for rash.  Neurological: Negative for dizziness, loss of consciousness and headaches.  Endo/Heme/Allergies: Negative for environmental allergies.  Psychiatric/Behavioral: Negative for depression. The patient is not nervous/anxious.        Objective:    Physical Exam  Constitutional: She is oriented to person, place, and time. She appears well-developed and well-nourished. No distress.  HENT:  Head: Normocephalic and atraumatic.  Nose: Nose normal.  Eyes: Right eye exhibits no discharge. Left eye exhibits no discharge.  Neck: Normal range of motion. Neck supple.  Cardiovascular:  Normal rate and regular rhythm.  No murmur heard. Pulmonary/Chest: Effort normal and breath sounds normal.  Abdominal: Soft. Bowel sounds are normal. There is no tenderness.  Musculoskeletal: She exhibits no edema.  Neurological: She is alert and oriented to person, place, and time.  Skin: Skin is warm and dry.  Psychiatric: She has a normal mood and affect.  Nursing note and vitals reviewed.   BP 126/78   Pulse 98   Temp (!) 97.5 F (36.4 C) (Oral)   Resp 16   Ht 5' 6.14" (1.68 m)   Wt 179 lb 12.8 oz (81.6 kg)   SpO2 97%   BMI 28.90 kg/m  Wt Readings from Last 3 Encounters:  10/25/17 179 lb 12.8 oz (81.6 kg)  10/19/17 174 lb (78.9 kg)  10/11/17 175 lb 6.4 oz (79.6 kg)   BP Readings from Last 3 Encounters:  10/25/17 126/78  10/19/17 (!) 157/11  10/11/17 140/90     Immunization History  Administered Date(s) Administered  . Influenza Whole 09/09/2009, 09/12/2013, 07/12/2015  . Influenza,inj,Quad PF,6+ Mos 07/14/2016, 07/27/2017  . Influenza-Unspecified 08/23/2014, 07/06/2015  . Pneumococcal Conjugate-13 07/12/2015  . Pneumococcal Polysaccharide-23 09/06/2014, 07/12/2015  . Pneumococcal-Unspecified 07/06/2015  . Tdap 12/01/2010, 10/11/2015  . Zoster 10/11/2015, 09/15/2016    Health Maintenance  Topic Date Due  . MAMMOGRAM  12/23/2017  . PAP SMEAR  10/10/2018  . COLONOSCOPY  10/26/2019  . TETANUS/TDAP  10/10/2025  . INFLUENZA VACCINE  Completed  . Hepatitis C Screening  Completed  . HIV Screening  Completed    Lab Results  Component Value Date   WBC 3.6 (L) 08/20/2017   HGB 11.4 (L) 08/20/2017   HCT 34.7 (L) 08/20/2017   PLT 156.0 08/20/2017   GLUCOSE 95 08/20/2017   CHOL 163 12/31/2016   TRIG 65.0 12/31/2016   HDL 48.00 12/31/2016   LDLCALC 102 (H) 12/31/2016  ALT 23 08/20/2017   AST 44 (H) 08/20/2017   NA 138 08/20/2017   K 3.6 08/20/2017   CL 103 08/20/2017   CREATININE 0.67 08/20/2017   BUN 14 08/20/2017   CO2 28 08/20/2017   TSH 1.34  12/31/2016    Lab Results  Component Value Date   TSH 1.34 12/31/2016   Lab Results  Component Value Date   WBC 3.6 (L) 08/20/2017   HGB 11.4 (L) 08/20/2017   HCT 34.7 (L) 08/20/2017   MCV 88.0 08/20/2017   PLT 156.0 08/20/2017   Lab Results  Component Value Date   NA 138 08/20/2017   K 3.6 08/20/2017   CHLORIDE 108 12/31/2016   CO2 28 08/20/2017   GLUCOSE 95 08/20/2017   BUN 14 08/20/2017   CREATININE 0.67 08/20/2017   BILITOT 0.4 08/20/2017   ALKPHOS 37 (L) 08/20/2017   AST 44 (H) 08/20/2017   ALT 23 08/20/2017   PROT 9.5 (H) 08/20/2017   ALBUMIN 3.5 08/20/2017   CALCIUM 8.8 08/20/2017   ANIONGAP 9 12/31/2016   EGFR >90 12/31/2016   GFR 114.24 08/20/2017   Lab Results  Component Value Date   CHOL 163 12/31/2016   Lab Results  Component Value Date   HDL 48.00 12/31/2016   Lab Results  Component Value Date   LDLCALC 102 (H) 12/31/2016   Lab Results  Component Value Date   TRIG 65.0 12/31/2016   Lab Results  Component Value Date   CHOLHDL 3 12/31/2016   No results found for: HGBA1C       Assessment & Plan:   Problem List Items Addressed This Visit    Hypertension    Well controlled, no changes to meds. Encouraged heart healthy diet such as the DASH diet and exercise as tolerated.       Chest wall pain    Patient achy similar to when she had pneumonia in past will proceed with labs and cxr      Relevant Orders   CBC with Differential/Platelet   Sedimentation rate   DG Chest 2 View   Abnormal SPEP    Follows with oncology      Anemia    Check with CBC      C. difficile colitis    Still taking Vancomycin but is down to bid then at end of week is instructed to decrease to qday.       Relevant Orders   CBC with Differential/Platelet   Cystitis    Treated with Keflex and is feeling better. Will recheck UA and culture today      Relevant Orders   CBC with Differential/Platelet   Comprehensive metabolic panel   Sedimentation rate     Urinalysis   Urine Culture      I am having Gershon Mussel maintain her docusate sodium, Vitamin D3, Multiple Vitamins-Minerals (CENTRUM SILVER ADULT 50+ PO), modafinil, zolpidem, ergocalciferol, silver sulfADIAZINE, gabapentin, tiZANidine, conjugated estrogens, acetaminophen, metoCLOPramide, famotidine, escitalopram, vancomycin, carvedilol, topiramate, amLODipine, triamterene-hydrochlorothiazide, furosemide, fluticasone, albuterol, aspirin EC, HYDROcodone-acetaminophen, pantoprazole, potassium chloride SA, sucralfate, and cephALEXin.  No orders of the defined types were placed in this encounter.   CMA served as Education administrator during this visit. History, Physical and Plan performed by medical provider. Documentation and orders reviewed and attested to.  Penni Homans, MD

## 2017-10-25 NOTE — Assessment & Plan Note (Signed)
Treated with Keflex and is feeling better. Will recheck UA and culture today

## 2017-10-25 NOTE — Assessment & Plan Note (Signed)
Patient achy similar to when she had pneumonia in past will proceed with labs and cxr

## 2017-10-25 NOTE — Assessment & Plan Note (Signed)
Still taking Vancomycin but is down to bid then at end of week is instructed to decrease to qday.

## 2017-10-26 LAB — CBC WITH DIFFERENTIAL/PLATELET
BASOS PCT: 1.7 % (ref 0.0–3.0)
Basophils Absolute: 0.1 10*3/uL (ref 0.0–0.1)
EOS PCT: 2.2 % (ref 0.0–5.0)
Eosinophils Absolute: 0.1 10*3/uL (ref 0.0–0.7)
HEMATOCRIT: 34.3 % — AB (ref 36.0–46.0)
Hemoglobin: 11.2 g/dL — ABNORMAL LOW (ref 12.0–15.0)
LYMPHS ABS: 1.2 10*3/uL (ref 0.7–4.0)
LYMPHS PCT: 39 % (ref 12.0–46.0)
MCHC: 32.7 g/dL (ref 30.0–36.0)
MCV: 87.6 fl (ref 78.0–100.0)
MONOS PCT: 7.8 % (ref 3.0–12.0)
Monocytes Absolute: 0.2 10*3/uL (ref 0.1–1.0)
NEUTROS ABS: 1.5 10*3/uL (ref 1.4–7.7)
NEUTROS PCT: 49.3 % (ref 43.0–77.0)
PLATELETS: 135 10*3/uL — AB (ref 150.0–400.0)
RBC: 3.91 Mil/uL (ref 3.87–5.11)
RDW: 15.7 % — AB (ref 11.5–15.5)
WBC: 2.9 10*3/uL — ABNORMAL LOW (ref 4.0–10.5)

## 2017-10-26 LAB — URINE CULTURE
MICRO NUMBER: 81415028
RESULT: NO GROWTH
SPECIMEN QUALITY:: ADEQUATE

## 2017-10-26 LAB — URINALYSIS
Bilirubin Urine: NEGATIVE
Hgb urine dipstick: NEGATIVE
Ketones, ur: NEGATIVE
Leukocytes, UA: NEGATIVE
Nitrite: NEGATIVE
PH: 6 (ref 5.0–8.0)
SPECIFIC GRAVITY, URINE: 1.02 (ref 1.000–1.030)
TOTAL PROTEIN, URINE-UPE24: NEGATIVE
URINE GLUCOSE: NEGATIVE
UROBILINOGEN UA: 0.2 (ref 0.0–1.0)

## 2017-10-26 LAB — SEDIMENTATION RATE: Sed Rate: 109 mm/hr — ABNORMAL HIGH (ref 0–30)

## 2017-10-26 LAB — COMPREHENSIVE METABOLIC PANEL
ALT: 14 U/L (ref 0–35)
AST: 22 U/L (ref 0–37)
Albumin: 3.5 g/dL (ref 3.5–5.2)
Alkaline Phosphatase: 40 U/L (ref 39–117)
BILIRUBIN TOTAL: 0.2 mg/dL (ref 0.2–1.2)
BUN: 20 mg/dL (ref 6–23)
CALCIUM: 9.2 mg/dL (ref 8.4–10.5)
CHLORIDE: 106 meq/L (ref 96–112)
CO2: 28 meq/L (ref 19–32)
Creatinine, Ser: 0.74 mg/dL (ref 0.40–1.20)
GFR: 101.81 mL/min (ref >60.00)
Glucose, Bld: 101 mg/dL — ABNORMAL HIGH (ref 70–99)
POTASSIUM: 3.8 meq/L (ref 3.5–5.1)
Sodium: 138 mEq/L (ref 135–145)
Total Protein: 9.7 g/dL — ABNORMAL HIGH (ref 6.0–8.3)

## 2017-10-27 ENCOUNTER — Telehealth: Payer: Self-pay | Admitting: Family Medicine

## 2017-10-27 NOTE — Telephone Encounter (Signed)
Copied from [place]. Topic: Quick Communication - See Telephone Encounter >> Oct 27, 2017  4:18 PM Clack, Laban Emperor wrote: CRM for notification. See Telephone encounter for:  Pt would like someone to call her with her lab results.  Pt also states that she is still in pain. 10/27/17.

## 2017-10-28 ENCOUNTER — Other Ambulatory Visit: Payer: Self-pay | Admitting: Hematology

## 2017-10-28 ENCOUNTER — Other Ambulatory Visit: Payer: Self-pay | Admitting: Family Medicine

## 2017-10-28 MED ORDER — CEFDINIR 300 MG PO CAPS: 300.0000 mg | ORAL_CAPSULE | Freq: Two times a day (BID) | ORAL | 0 refills | Status: DC

## 2017-10-28 NOTE — Telephone Encounter (Signed)
Called patient left message for patient to call back

## 2017-10-28 NOTE — Progress Notes (Signed)
cefdini

## 2017-11-08 ENCOUNTER — Other Ambulatory Visit: Payer: Self-pay

## 2017-11-08 ENCOUNTER — Ambulatory Visit (HOSPITAL_BASED_OUTPATIENT_CLINIC_OR_DEPARTMENT_OTHER): Payer: Medicaid Other | Admitting: Hematology & Oncology

## 2017-11-08 ENCOUNTER — Other Ambulatory Visit (HOSPITAL_BASED_OUTPATIENT_CLINIC_OR_DEPARTMENT_OTHER): Payer: Medicaid Other

## 2017-11-08 ENCOUNTER — Encounter: Payer: Self-pay | Admitting: Hematology & Oncology

## 2017-11-08 VITALS — BP 141/88 | HR 88 | Temp 98.1°F | Resp 19 | Wt 174.8 lb

## 2017-11-08 DIAGNOSIS — D472 Monoclonal gammopathy: Secondary | ICD-10-CM

## 2017-11-08 DIAGNOSIS — C9000 Multiple myeloma not having achieved remission: Secondary | ICD-10-CM

## 2017-11-08 LAB — CMP (CANCER CENTER ONLY)
ALT: 36 U/L (ref 10–47)
AST: 35 U/L (ref 11–38)
Albumin: 3.2 g/dL — ABNORMAL LOW (ref 3.3–5.5)
Alkaline Phosphatase: 44 U/L (ref 26–84)
BILIRUBIN TOTAL: 0.5 mg/dL (ref 0.20–1.60)
BUN: 19 mg/dL (ref 7–22)
CHLORIDE: 107 meq/L (ref 98–108)
CO2: 25 mEq/L (ref 18–33)
CREATININE: 0.9 mg/dL (ref 0.6–1.2)
Calcium: 9.9 mg/dL (ref 8.0–10.3)
Glucose, Bld: 96 mg/dL (ref 73–118)
Potassium: 4 mEq/L (ref 3.3–4.7)
SODIUM: 143 meq/L (ref 128–145)
TOTAL PROTEIN: 10.3 g/dL — AB (ref 6.4–8.1)

## 2017-11-08 LAB — CBC WITH DIFFERENTIAL (CANCER CENTER ONLY)
BASO#: 0 10e3/uL (ref 0.0–0.2)
BASO%: 0.4 % (ref 0.0–2.0)
EOS%: 2.4 % (ref 0.0–7.0)
Eosinophils Absolute: 0.1 10e3/uL (ref 0.0–0.5)
HCT: 34.1 % — ABNORMAL LOW (ref 34.8–46.6)
HGB: 11.1 g/dL — ABNORMAL LOW (ref 11.6–15.9)
LYMPH#: 0.9 10e3/uL (ref 0.9–3.3)
LYMPH%: 36.6 % (ref 14.0–48.0)
MCH: 28.7 pg (ref 26.0–34.0)
MCHC: 32.6 g/dL (ref 32.0–36.0)
MCV: 88 fL (ref 81–101)
MONO#: 0.2 10e3/uL (ref 0.1–0.9)
MONO%: 7.1 % (ref 0.0–13.0)
NEUT#: 1.4 10e3/uL — ABNORMAL LOW (ref 1.5–6.5)
NEUT%: 53.5 % (ref 39.6–80.0)
Platelets: 145 10e3/uL (ref 145–400)
RBC: 3.87 10e6/uL (ref 3.70–5.32)
RDW: 14.6 % (ref 11.1–15.7)
WBC: 2.5 10e3/uL — ABNORMAL LOW (ref 3.9–10.0)

## 2017-11-08 NOTE — Progress Notes (Signed)
Hematology and Oncology Follow Up Visit  Hetal [name] [id number] 1956/05/12 61 y.o. 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 is back for follow-up.  Unfortunately, she was admitted over at [facility] with another episode of pneumonia.  I have to believe that she is transforming over to multiple myeloma.  Her immune system clearly is altered.  Only saw her 3 months ago, her M spike was 2.6 g/dL.  Her IgG level was 4000 mg/dL.  I have to believe that we have to consider her for treatment.  I do think that she will continue to have issues with infections.  I believe that she is going to need to have a bone marrow test done.  I talked to her about this.  We have never had to do a bone marrow test on her.  There is been no rashes.  She has had no change in bowel or bladder habits.  There is been no nausea or vomiting.  She has had no dysuria.  She has had a problem with C. difficile.  She is over this.  Again, I have to believe that she is transforming over to myeloma.  We will have to see what her overall, her performance status is ECOG 1.   Medications:  Current Outpatient Medications:  .  acetaminophen (TYLENOL) 325 MG tablet, Take 650 mg by mouth., Disp: , Rfl:  .  albuterol (PROVENTIL HFA;VENTOLIN HFA) 108 (90 Base) MCG/ACT inhaler, Inhale 2 puffs into the lungs every 6 (six) hours as needed for wheezing or shortness of breath., Disp: 1 Inhaler, Rfl: 1 .  amLODipine (NORVASC) 10 MG tablet, Take 1 tablet (10 mg total) by mouth daily., Disp: 90 tablet, Rfl: 1 .  aspirin EC 81 MG tablet, Take 1 tablet (81 mg total) by mouth daily., Disp: 30 tablet, Rfl: 1 .  carvedilol (COREG) 25 MG tablet, Take 1 tablet (25 mg total) by mouth 2 (two) times daily with a meal., Disp: 180 tablet, Rfl: 1 .  cefdinir (OMNICEF) 300 MG capsule, Take 1 capsule (300 mg total) by  mouth 2 (two) times daily., Disp: 14 capsule, Rfl: 0 .  cephALEXin (KEFLEX) 500 MG capsule, Take 1 capsule (500 mg total) by mouth 4 (four) times daily., Disp: 20 capsule, Rfl: 0 .  Cholecalciferol (VITAMIN D3) 5000 UNITS CAPS, Take 1 capsule by mouth daily., Disp: , Rfl:  .  conjugated estrogens (PREMARIN) vaginal cream, Place vaginally., Disp: , Rfl:  .  docusate sodium (COLACE) 50 MG capsule, Take 50 mg by mouth 2 (two) times daily., Disp: , Rfl:  .  ergocalciferol (VITAMIN D2) 50000 units capsule, Take 1 capsule (50,000 Units total) by mouth once a week., Disp: 12 capsule, Rfl: 1 .  escitalopram (LEXAPRO) 10 MG tablet, Take 1 tablet (10 mg total) by mouth daily., Disp: 30 tablet, Rfl: 3 .  famotidine (PEPCID) 40 MG tablet, Take 1 tablet (40 mg total) by mouth at bedtime., Disp: 90 tablet, Rfl: 1 .  fluticasone (FLONASE) 50 MCG/ACT nasal spray, Place 2 sprays into both nostrils daily., Disp: 16 g, Rfl: 6 .  furosemide (LASIX) 20 MG tablet, Take 1 tablet (20 mg total) by mouth daily as needed (WT gain>3# in 24 HR)., Disp: 90 tablet, Rfl: 1 .  gabapentin (NEURONTIN) 300 MG capsule, Take 1 capsule (300 mg total) by mouth 3 (three) times  daily., Disp: 90 capsule, Rfl: 3 .  HYDROcodone-acetaminophen (NORCO) 5-325 MG tablet, Take 1 tablet by mouth 3 (three) times daily as needed for moderate pain., Disp: 15 tablet, Rfl: 0 .  metoCLOPramide (REGLAN) 10 MG tablet, Take 1 tablet (10 mg total) by mouth 4 (four) times daily -  before meals and at bedtime., Disp: 60 tablet, Rfl: 4 .  modafinil (PROVIGIL) 100 MG tablet, Take 1 tablet (100 mg total) by mouth daily., Disp: 30 tablet, Rfl: 5 .  Multiple Vitamins-Minerals (CENTRUM SILVER ADULT 50+ PO), Take by mouth., Disp: , Rfl:  .  pantoprazole (PROTONIX) 40 MG tablet, Take 1 tablet (40 mg total) by mouth 2 (two) times daily., Disp: 180 tablet, Rfl: 1 .  potassium chloride SA (KLOR-CON M20) 20 MEQ tablet, Take 1 tablet (20 mEq total) by mouth daily., Disp: 30  tablet, Rfl: 1 .  silver sulfADIAZINE (SILVADENE) 1 % cream, Apply 1 application topically daily., Disp: 50 g, Rfl: 1 .  sucralfate (CARAFATE) 1 g tablet, Take 1 tablet (1 g total) by mouth 4 (four) times daily -  with meals and at bedtime. prn, Disp: 120 tablet, Rfl: 1 .  tiZANidine (ZANAFLEX) 4 MG tablet, Take 0.5-1 tablets (2-4 mg total) by mouth at bedtime as needed for muscle spasms., Disp: 30 tablet, Rfl: 1 .  topiramate (TOPAMAX) 25 MG tablet, Take 1 tablet (25 mg total) by mouth 2 (two) times daily., Disp: 60 tablet, Rfl: 2 .  triamterene-hydrochlorothiazide (MAXZIDE-25) 37.5-25 MG tablet, Take 1 tablet by mouth daily., Disp: 90 tablet, Rfl: 1 .  vancomycin (VANCOCIN) 125 MG capsule, Take 1 capsule (125 mg total) 4 (four) times daily by mouth. For (7) seven days, Disp: 28 capsule, Rfl: 0 .  zolpidem (AMBIEN) 10 MG tablet, Take 0.5-1 tablets (5-10 mg total) by mouth at bedtime as needed for sleep., Disp: 30 tablet, Rfl: 1  Allergies:  Allergies  Allergen Reactions  . Benazepril Anaphylaxis    angioedema  . Codeine Nausea And Vomiting  . Morphine     Redness and hives noted post IV admin on 07/05/17  . Ondansetron Hcl     Redness and hives post IV admin on 07/05/17    Past Medical History, Surgical history, Social history, and Family History were reviewed and updated.  Review of Systems: As stated in the interim history   Physical Exam:  weight is 174 lb 12.8 oz (79.3 kg). Her oral temperature is 98.1 F (36.7 C). Her blood pressure is 141/88 (abnormal) and her pulse is 88. Her respiration is 19 and oxygen saturation is 100%.   Wt Readings from Last 3 Encounters:  11/08/17 174 lb 12.8 oz (79.3 kg)  10/25/17 179 lb 12.8 oz (81.6 kg)  10/19/17 174 lb (78.9 kg)     Physical Exam  Constitutional: She is oriented to person, place, and time.  HENT:  Head: Normocephalic and atraumatic.  Mouth/Throat: Oropharynx is clear and moist.  Eyes: EOM are normal. Pupils are equal,  round, and reactive to light.  Neck: Normal range of motion.  Cardiovascular: Normal rate, regular rhythm and normal heart sounds.  Pulmonary/Chest: Effort normal and breath sounds normal.  Abdominal: Soft. Bowel sounds are normal.  Musculoskeletal: Normal range of motion. She exhibits no edema, tenderness or deformity.  Lymphadenopathy:    She has no cervical adenopathy.  Neurological: She is alert and oriented to person, place, and time.  Skin: Skin is warm and dry. No rash noted. No erythema.  Psychiatric: She has a  normal mood and affect. Her behavior is normal. Judgment and thought content normal.  Vitals reviewed.  a.   Lab Results  Component Value Date   WBC 2.5 (L) 11/08/2017   HGB 11.1 (L) 11/08/2017   HCT 34.1 (L) 11/08/2017   MCV 88 11/08/2017   PLT 145 11/08/2017     Chemistry      Component Value Date/Time   NA 143 11/08/2017 1127   NA 140 12/31/2016 1000   K 4.0 11/08/2017 1127   K 3.6 12/31/2016 1000   CL 107 11/08/2017 1127   CO2 25 11/08/2017 1127   CO2 24 12/31/2016 1000   BUN 19 11/08/2017 1127   BUN 22.3 12/31/2016 1000   CREATININE 0.9 11/08/2017 1127   CREATININE 0.8 12/31/2016 1000      Component Value Date/Time   CALCIUM 9.9 11/08/2017 1127   CALCIUM 9.5 12/31/2016 1000   ALKPHOS 44 11/08/2017 1127   ALKPHOS 51 12/31/2016 1000   AST 35 11/08/2017 1127   AST 28 12/31/2016 1000   ALT 36 11/08/2017 1127   ALT 16 12/31/2016 1000   BILITOT 0.50 11/08/2017 1127   BILITOT 0.46 12/31/2016 1000         Impression and Plan: [name] is a 61 year old African-American female. She has a monoclonal gammopathy. I would say this is an IgG kappa MGUS.   I have to believe that she is transformed over to myeloma.  We will have to see what her M spike is.  I think that if we treated her, she will do well with Revlimid/Velcade/Decadron.  I gave her information sheets about Revlimid and Velcade.  She apparently has a healthcare power of attorney that I  need to talk to.  I will try to speak with her later on today.  We will try to get a bone marrow test set up for her in a week or so.  The cytogenetics will be incredibly important on the bone marrow test.  I will plan to get her back to his see me in another 4 weeks.  I spent about 40 minutes with her today.  I answered her questions.  I reassured her that I just did not think that we were going to have a problem if we had to treat her.  [name], MD 12/31/201812:31 PM

## 2017-11-09 LAB — IGG, IGA, IGM
IgA, Qn, Serum: 333 mg/dL (ref 87–352)
IgG, Qn, Serum: 4109 mg/dL — ABNORMAL HIGH (ref 700–1600)
IgM, Qn, Serum: 172 mg/dL (ref 26–217)

## 2017-11-10 LAB — KAPPA/LAMBDA LIGHT CHAINS
Ig Kappa Free Light Chain: 106.9 mg/L — ABNORMAL HIGH (ref 3.3–19.4)
Ig Lambda Free Light Chain: 11.8 mg/L (ref 5.7–26.3)
KAPPA/LAMBDA FLC RATIO: 9.06 — AB (ref 0.26–1.65)

## 2017-11-11 ENCOUNTER — Ambulatory Visit (INDEPENDENT_AMBULATORY_CARE_PROVIDER_SITE_OTHER): Payer: Medicaid Other | Admitting: Family Medicine

## 2017-11-11 ENCOUNTER — Encounter: Payer: Self-pay | Admitting: Family Medicine

## 2017-11-11 ENCOUNTER — Telehealth: Payer: Self-pay | Admitting: Family Medicine

## 2017-11-11 DIAGNOSIS — F329 Major depressive disorder, single episode, unspecified: Secondary | ICD-10-CM

## 2017-11-11 DIAGNOSIS — D472 Monoclonal gammopathy: Secondary | ICD-10-CM | POA: Diagnosis not present

## 2017-11-11 DIAGNOSIS — A0472 Enterocolitis due to Clostridium difficile, not specified as recurrent: Secondary | ICD-10-CM

## 2017-11-11 DIAGNOSIS — J189 Pneumonia, unspecified organism: Secondary | ICD-10-CM | POA: Diagnosis not present

## 2017-11-11 DIAGNOSIS — F32A Depression, unspecified: Secondary | ICD-10-CM

## 2017-11-11 DIAGNOSIS — I10 Essential (primary) hypertension: Secondary | ICD-10-CM

## 2017-11-11 MED ORDER — FLUOXETINE HCL 20 MG PO TABS
ORAL_TABLET | ORAL | 3 refills | Status: DC
Start: 2017-11-11 — End: 2017-12-07

## 2017-11-11 NOTE — Assessment & Plan Note (Signed)
She has an appointment for a CT scan of the bones scheduled for 11/17/2017 to evaluate for progression of disease due to her recent infectious disease

## 2017-11-11 NOTE — Assessment & Plan Note (Signed)
Has tolerated her recent antibiotics fairly well but does note her stool are somewhat looser. She is encouraged to follow up with gastroenterolgy who has been managing her Vancomycin

## 2017-11-11 NOTE — Assessment & Plan Note (Addendum)
Secondary to chronic illness. Started on Fluoxetine 20 mg tabs, 1/2 tab po daily x 7 days then increase to 1 tab daily. Return in 4-6 weeks. She tried Lexapro previously and had not side effects but did not take it long enough to assess benefit

## 2017-11-11 NOTE — Telephone Encounter (Signed)
Orders given to RN for Help with POA from Social worker

## 2017-11-11 NOTE — Assessment & Plan Note (Signed)
Recently hospitalized and treated will need follow up CXR next month to assess clearance

## 2017-11-11 NOTE — Patient Instructions (Addendum)
We will add a Selective Serotonin Reuptake Inhibitor today to help with the depression of being chronically ill. Let us know if you have any concerns.  Fluoxetine 20 mg tabs, 1/2 tab po daily x 7 days then increase to 1 tab daily  Call gastroenterology regarding loose stool and recent antibiotic use.   Probiotic multiple strain version with 3 to 10 different types  [employer] probiotic, 1 cap daily    Complicated Grieving Grief is a normal response to the death of someone close to you. Feelings of fear, anger, and guilt can affect almost everyone who loses a loved one. It is also common to have symptoms of depression while you are grieving. These include problems with sleep, loss of appetite, and lack of energy. They may last for weeks or months after a loss. Complicated grief is different from normal grief or depression. Normal grieving involves sadness and feelings of loss, but these feelings are not constant. Complicated grief is a constant and severe type of grief. It interferes with your ability to function normally. It may last for several months to a year or longer. Complicated grief may require treatment from a mental health care provider. What are the causes? It is not known why some people continue to struggle with grief and others do not. You may be at higher risk for complicated grief if:  The death of your loved one was sudden or unexpected.  The death of your loved one was due to a violent event.  Your loved one committed suicide.  Your loved one was a child or a young person.  You were very close to or dependent on the loved one.  You have a history of depression.  What are the signs or symptoms? Signs and symptoms of complicated grief may include:  Feeling disbelief or numbness.  Being unable to enjoy good memories of your loved one.  Needing to avoid anything that reminds you of your loved one.  Being unable to stop thinking about the death.  Feeling intense  anger or guilt.  Feeling alone and hopeless.  Feeling that your life is meaningless and empty.  Losing the desire to live.  How is this diagnosed? Your health care provider may diagnose complicated grief if:  You have constant symptoms of grief for 6-12 months or longer.  Your symptoms are interfering with your ability to live your life.  Your health care provider may want you to see a mental health care provider. Many symptoms of depression are similar to the symptoms of complicated grief. It is important to be evaluated for complicated grief along with other mental health conditions. How is this treated? Talk therapy with a mental health provider is the most common treatment for complicated grief. During therapy, you will learn healthy ways to cope with the loss of your loved one. In some cases, your mental health care provider may also recommend antidepressant medicines. Follow these instructions at home:  Take care of yourself. ? Eat regular meals and maintain a healthy diet. Eat plenty of fruits, vegetables, and whole grains. ? Try to get some exercise each day. ? Keep regular hours for sleep. Try to get at least 8 hours of sleep each night.  Do not use drugs or alcohol to ease your symptoms.  Take medicines only as directed by your health care provider.  Spend time with friends and loved ones.  Consider joining a grief (bereavement) support group to help you deal with your loss.  Keep  all follow-up visits as directed by your health care provider. This is important. Contact a health care provider if:  Your symptoms keep you from functioning normally.  Your symptoms do not get better with treatment. Get help right away if:  You have serious thoughts of hurting yourself or someone else.  You have suicidal feelings. This information is not intended to replace advice given to you by your health care provider. Make sure you discuss any questions you have with your health  care provider. Document Released: 10/26/2005 Document Revised: 04/02/2016 Document Reviewed: 04/05/2014 Elsevier Interactive Patient Education  [employer].

## 2017-11-11 NOTE — Progress Notes (Signed)
Subjective:  I acted as a Education administrator for Dr. Charlett Blake. Tami [name], Tami [name]  [name], female    DOB: 1956-04-22, 62 y.o.   MRN: [id number]  No chief complaint on file.   HPI  Patient is in today for a 1 month follow up and unfortunately she hospitalized again over the holiday season with left lung pneumonia and a urinary tract infection.  She is feeling somewhat better although she does endorse persistent weakness.  She also notes her stool which had formed up some have become a little bit looser since having 3 antibiotics in the hospital.  With her history of C. difficile she has been monitoring her stool and there is been no bloody or tarry stool.  No fevers or chills or abdominal pain noted.  She does endorse anorexia.  She does endorse anhedonia and depression secondary to her chronic illness.  No suicidal ideation.  She has an appointment for a CT scan for bone evaluation due to her recent infectious disease and her history of MGUS this coming week. Denies CP/palp/SOB/HA/congestion/fevers or GU c/o. Taking meds as prescribed  Patient Care Team: Mosie Lukes, MD as PCP - General (Family Medicine)   Past Medical History:  Diagnosis Date  . Abnormal SPEP 07/26/2016  . Arthritis    knees, hands  . Back pain 07/14/2016  . Bowel obstruction ([place]) 11/2014  . C. difficile diarrhea   . CHF (congestive heart failure) ([place])   . Congestive heart failure ([place]) 02/24/2015   S/p pacemaker  . Depression   . Elevated sed rate 08/15/2017  . Epigastric pain 03/22/2017  . GERD (gastroesophageal reflux disease)   . Hyperlipidemia   . Hypertension   . Low back pain 07/14/2016  . 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 Myalgia 12/31/2016  . Obstructive sleep apnea 03/31/2015  . SOB (shortness of breath) 04/09/2016  . Stroke [facility])    TIAs    Past Surgical History:  Procedure Laterality Date  . ABDOMINAL HYSTERECTOMY     menorraghia, 2006, total  . CHOLECYSTECTOMY    .  KNEE SURGERY     right, repair torn torn cartialage  . PACEMAKER INSERTION  08/2014  . TONSILLECTOMY    . TUBAL LIGATION      Family History  Problem Relation Age of Onset  . Diabetes Mother   . Hypertension Mother   . Heart disease Mother        s/p 1 stent  . Hyperlipidemia Mother   . Arthritis Mother   . Cancer Father        COLON  . Colon cancer Father 37  . Irritable bowel syndrome Sister   . Hyperlipidemia Daughter   . Hypertension Daughter   . Hypertension Maternal Grandmother   . Arthritis Maternal Grandmother   . Heart disease Maternal Grandfather        MI  . Hypertension Maternal Grandfather   . Arthritis Maternal Grandfather   . Hypertension Son     Social History   Socioeconomic History  . Marital status: Single    Spouse name: [name]  . Number of children: [name]  . Years of education: [name]  . Highest education level: [name]  Social Needs  . Financial resource strain: [name]  . Food insecurity - worry: [name]  . Food insecurity - inability: [name]  . Transportation needs - medical: [name]  . Transportation  needs - non-medical: [name]  Occupational History  . [name]  Tobacco Use  . Smoking status: Never Smoker  . Smokeless tobacco: Never Used  Substance and Sexual Activity  . Alcohol use: No    Alcohol/week: 0.0 oz  . Drug use: No  . Sexual activity: [name]  Other Topics Concern  . [name]  Social History Narrative  . [name]    Outpatient Medications Prior to Visit  Medication Sig Dispense Refill  . acetaminophen (TYLENOL) 325 MG tablet Take 650 mg by mouth.    Marland Kitchen albuterol (PROVENTIL HFA;VENTOLIN HFA) 108 (90 Base) MCG/ACT inhaler Inhale 2 puffs into the lungs every 6 (six) hours as needed for wheezing or shortness of breath. 1 Inhaler 1  . amLODipine (NORVASC) 10 MG tablet Take 1 tablet (10 mg total) by mouth daily. 90 tablet 1  . aspirin EC 81 MG tablet Take 1 tablet (81  mg total) by mouth daily. 30 tablet 1  . carvedilol (COREG) 25 MG tablet Take 1 tablet (25 mg total) by mouth 2 (two) times daily with a meal. 180 tablet 1  . Cholecalciferol (VITAMIN D3) 5000 UNITS CAPS Take 1 capsule by mouth daily.    Marland Kitchen conjugated estrogens (PREMARIN) vaginal cream Place vaginally.    . docusate sodium (COLACE) 50 MG capsule Take 50 mg by mouth 2 (two) times daily.    . ergocalciferol (VITAMIN D2) 50000 units capsule Take 1 capsule (50,000 Units total) by mouth once a week. 12 capsule 1  . famotidine (PEPCID) 40 MG tablet Take 1 tablet (40 mg total) by mouth at bedtime. 90 tablet 1  . fluticasone (FLONASE) 50 MCG/ACT nasal spray Place 2 sprays into both nostrils daily. 16 g 6  . furosemide (LASIX) 20 MG tablet Take 1 tablet (20 mg total) by mouth daily as needed (WT gain>3# in 24 HR). 90 tablet 1  . gabapentin (NEURONTIN) 300 MG capsule Take 1 capsule (300 mg total) by mouth 3 (three) times daily. 90 capsule 3  . HYDROcodone-acetaminophen (NORCO) 5-325 MG tablet Take 1 tablet by mouth 3 (three) times daily as needed for moderate pain. 15 tablet 0  . metoCLOPramide (REGLAN) 10 MG tablet Take 1 tablet (10 mg total) by mouth 4 (four) times daily -  before meals and at bedtime. 60 tablet 4  . modafinil (PROVIGIL) 100 MG tablet Take 1 tablet (100 mg total) by mouth daily. 30 tablet 5  . Multiple Vitamins-Minerals (CENTRUM SILVER ADULT 50+ PO) Take by mouth.    . pantoprazole (PROTONIX) 40 MG tablet Take 1 tablet (40 mg total) by mouth 2 (two) times daily. 180 tablet 1  . potassium chloride SA (KLOR-CON M20) 20 MEQ tablet Take 1 tablet (20 mEq total) by mouth daily. 30 tablet 1  . silver sulfADIAZINE (SILVADENE) 1 % cream Apply 1 application topically daily. 50 g 1  . sucralfate (CARAFATE) 1 g tablet Take 1 tablet (1 g total) by mouth 4 (four) times daily -  with meals and at bedtime. prn 120 tablet 1  . tiZANidine (ZANAFLEX) 4 MG tablet Take 0.5-1 tablets (2-4 mg total) by mouth at  bedtime as needed for muscle spasms. 30 tablet 1  . topiramate (TOPAMAX) 25 MG tablet Take 1 tablet (25 mg total) by mouth 2 (two) times daily. 60 tablet 2  . triamterene-hydrochlorothiazide (MAXZIDE-25) 37.5-25 MG tablet Take 1 tablet by mouth daily. 90 tablet 1  . vancomycin (VANCOCIN) 125 MG capsule Take  1 capsule (125 mg total) 4 (four) times daily by mouth. For (7) seven days 28 capsule 0  . cefdinir (OMNICEF) 300 MG capsule Take 1 capsule (300 mg total) by mouth 2 (two) times daily. 14 capsule 0  . cephALEXin (KEFLEX) 500 MG capsule Take 1 capsule (500 mg total) by mouth 4 (four) times daily. 20 capsule 0  . escitalopram (LEXAPRO) 10 MG tablet Take 1 tablet (10 mg total) by mouth daily. 30 tablet 3  . zolpidem (AMBIEN) 10 MG tablet Take 0.5-1 tablets (5-10 mg total) by mouth at bedtime as needed for sleep. 30 tablet 1   No facility-administered medications prior to visit.     Allergies  Allergen Reactions  . Benazepril Anaphylaxis    angioedema  . Codeine Nausea And Vomiting  . Morphine     Redness and hives noted post IV admin on 07/05/17  . Ondansetron Hcl     Redness and hives post IV admin on 07/05/17    Review of Systems  Constitutional: Positive for malaise/fatigue. Negative for fever.  HENT: Negative for congestion.   Eyes: Negative for blurred vision.  Respiratory: Negative for shortness of breath.   Cardiovascular: Negative for chest pain, palpitations and leg swelling.  Gastrointestinal: Positive for diarrhea. Negative for abdominal pain, blood in stool and nausea.  Genitourinary: Negative for dysuria and frequency.  Musculoskeletal: Negative for falls.  Skin: Negative for rash.  Neurological: Positive for weakness. Negative for dizziness, loss of consciousness and headaches.  Endo/Heme/Allergies: Negative for environmental allergies.  Psychiatric/Behavioral: Positive for depression. Negative for suicidal ideas. The patient is nervous/anxious.        Objective:      Physical Exam  Constitutional: She is oriented to person, place, and time. She appears well-developed and well-nourished. No distress.  HENT:  Head: Normocephalic and atraumatic.  Nose: Nose normal.  Eyes: Right eye exhibits no discharge. Left eye exhibits no discharge.  Neck: Normal range of motion. Neck supple.  Cardiovascular: Normal rate and regular rhythm.  No murmur heard. Pulmonary/Chest: Effort normal and breath sounds normal.  Abdominal: Soft. Bowel sounds are normal. There is no tenderness.  Musculoskeletal: She exhibits no edema.  Neurological: She is alert and oriented to person, place, and time.  Skin: Skin is warm and dry.  Psychiatric: She has a normal mood and affect.  Nursing note and vitals reviewed.   BP (!) 132/92   Pulse 80   Temp (!) 97.4 F (36.3 C) (Oral)   Resp 18   Wt 174 lb 6.4 oz (79.1 kg)   SpO2 97%   BMI 28.03 kg/m  Wt Readings from Last 3 Encounters:  11/11/17 174 lb 6.4 oz (79.1 kg)  11/08/17 174 lb 12.8 oz (79.3 kg)  10/25/17 179 lb 12.8 oz (81.6 kg)   BP Readings from Last 3 Encounters:  11/11/17 (!) 132/92  11/08/17 (!) 141/88  10/25/17 126/78     Immunization History  Administered Date(s) Administered  . Influenza Whole 09/09/2009, 09/12/2013, 07/12/2015  . Influenza,inj,Quad PF,6+ Mos 07/14/2016, 07/27/2017  . Influenza-Unspecified 08/23/2014, 07/06/2015  . Pneumococcal Conjugate-13 07/12/2015  . Pneumococcal Polysaccharide-23 09/06/2014, 07/12/2015  . Pneumococcal-Unspecified 07/06/2015  . Tdap 12/01/2010, 10/11/2015  . Zoster 10/11/2015, 09/15/2016    Health Maintenance  Topic Date Due  . MAMMOGRAM  12/23/2017  . PAP SMEAR  10/10/2018  . COLONOSCOPY  10/26/2019  . TETANUS/TDAP  10/10/2025  . INFLUENZA VACCINE  Completed  . Hepatitis C Screening  Completed  . HIV Screening  Completed  Lab Results  Component Value Date   WBC 2.5 (L) 11/08/2017   HGB 11.1 (L) 11/08/2017   HCT 34.1 (L) 11/08/2017   PLT 145  11/08/2017   GLUCOSE 96 11/08/2017   CHOL 163 12/31/2016   TRIG 65.0 12/31/2016   HDL 48.00 12/31/2016   LDLCALC 102 (H) 12/31/2016   ALT 36 11/08/2017   AST 35 11/08/2017   NA 143 11/08/2017   K 4.0 11/08/2017   CL 107 11/08/2017   CREATININE 0.9 11/08/2017   BUN 19 11/08/2017   CO2 25 11/08/2017   TSH 1.34 12/31/2016    Lab Results  Component Value Date   TSH 1.34 12/31/2016   Lab Results  Component Value Date   WBC 2.5 (L) 11/08/2017   HGB 11.1 (L) 11/08/2017   HCT 34.1 (L) 11/08/2017   MCV 88 11/08/2017   PLT 145 11/08/2017   Lab Results  Component Value Date   NA 143 11/08/2017   K 4.0 11/08/2017   CHLORIDE 108 12/31/2016   CO2 25 11/08/2017   GLUCOSE 96 11/08/2017   BUN 19 11/08/2017   CREATININE 0.9 11/08/2017   BILITOT 0.50 11/08/2017   ALKPHOS 44 11/08/2017   AST 35 11/08/2017   ALT 36 11/08/2017   PROT 10.3 (H) 11/08/2017   ALBUMIN 3.2 (L) 11/08/2017   CALCIUM 9.9 11/08/2017   ANIONGAP 9 12/31/2016   EGFR >90 12/31/2016   GFR 101.81 10/25/2017   Lab Results  Component Value Date   CHOL 163 12/31/2016   Lab Results  Component Value Date   HDL 48.00 12/31/2016   Lab Results  Component Value Date   LDLCALC 102 (H) 12/31/2016   Lab Results  Component Value Date   TRIG 65.0 12/31/2016   Lab Results  Component Value Date   CHOLHDL 3 12/31/2016   No results found for: HGBA1C       Assessment & Plan:   Problem List Items Addressed This Visit    Monoclonal gammopathy of unknown significance (MGUS) (Chronic)    She has an appointment for a CT scan of the bones scheduled for 11/17/2017 to evaluate for progression of disease due to her recent infectious disease      Hypertension    Well controlled, no changes to meds. Encouraged heart healthy diet such as the DASH diet and exercise as tolerated. Improved on recheck      Depression    Secondary to chronic illness. Started on Fluoxetine 20 mg tabs, 1/2 tab po daily x 7 days then increase  to 1 tab daily. Return in 4-6 weeks. She tried Lexapro previously and had not side effects but did not take it long enough to assess benefit      Relevant Medications   FLUoxetine (PROZAC) 20 MG tablet   C. difficile colitis    Has tolerated her recent antibiotics fairly well but does note her stool are somewhat looser. She is encouraged to follow up with gastroenterolgy who has been managing her Vancomycin      Pneumonia    Recently hospitalized and treated will need follow up CXR next month to assess clearance         I have discontinued Lannette Donath zolpidem, escitalopram, cephALEXin, and cefdinir. I am also having her start on FLUoxetine. Additionally, I am having her maintain her docusate sodium, Vitamin D3, Multiple Vitamins-Minerals (CENTRUM SILVER ADULT 50+ PO), modafinil, ergocalciferol, silver sulfADIAZINE, gabapentin, tiZANidine, conjugated estrogens, acetaminophen, metoCLOPramide, famotidine, vancomycin, carvedilol, topiramate, amLODipine, triamterene-hydrochlorothiazide, furosemide, fluticasone, albuterol, aspirin  EC, HYDROcodone-acetaminophen, pantoprazole, potassium chloride SA, and sucralfate.  Meds ordered this encounter  Medications  . FLUoxetine (PROZAC) 20 MG tablet    Sig: 10 mg daily x 7 days then increase to 20 mg po daily    Dispense:  30 tablet    Refill:  3    CMA served as scribe during this visit. History, Physical and Plan performed by medical provider. Documentation and orders reviewed and attested to.  Penni Homans, MD

## 2017-11-11 NOTE — Telephone Encounter (Signed)
Copied from [place] [phone]. Topic: Quick Communication - See Telephone Encounter >> Nov 11, 2017  3:44 PM Oneta Rack wrote: CRM for notification. See Telephone encounter for:   11/11/17.  Caller name: Dorian Pod  Relation to pt: RN from [employer] back number: [phone]   Reason for call:  Requesting verbal orders for social worker to assist patient with filling out POA forms, please advise and lvm

## 2017-11-11 NOTE — Assessment & Plan Note (Signed)
Well controlled, no changes to meds. Encouraged heart healthy diet such as the DASH diet and exercise as tolerated. Improved on recheck 

## 2017-11-12 ENCOUNTER — Telehealth: Payer: Self-pay | Admitting: Family Medicine

## 2017-11-12 LAB — PROTEIN ELECTROPHORESIS, SERUM, WITH REFLEX
A/G Ratio: 0.5 — ABNORMAL LOW (ref 0.7–1.7)
ALBUMIN: 3.3 g/dL (ref 2.9–4.4)
ALPHA 1: 0.2 g/dL (ref 0.0–0.4)
Alpha 2: 1 g/dL (ref 0.4–1.0)
Beta: 1.3 g/dL (ref 0.7–1.3)
Gamma Globulin: 3.6 g/dL — ABNORMAL HIGH (ref 0.4–1.8)
Globulin, Total: 6.1 g/dL — ABNORMAL HIGH (ref 2.2–3.9)
INTERPRETATION(SEE BELOW): 0
M-SPIKE, %: 2.5 g/dL — AB
Total Protein: 9.4 g/dL — ABNORMAL HIGH (ref 6.0–8.5)

## 2017-11-12 NOTE — Telephone Encounter (Signed)
Copied from [place]. Topic: Quick Communication - See Telephone Encounter >> Nov 12, 2017  1:37 PM Aurelio Brash B wrote: CRM for notification. See Telephone encounter for:  Pt  calling about recall  on amLODipine (NORVASC) 10 MG tablet, she states she stopped taking the med  because of the recall

## 2017-11-12 NOTE — Telephone Encounter (Signed)
That helps explain why her bp is up. Have her start Verapamil ER capsule 120 mg po daily, disp #30 with 2 rf. bp check in 1 month

## 2017-11-15 ENCOUNTER — Ambulatory Visit: Payer: Medicaid Other | Admitting: Family Medicine

## 2017-11-15 ENCOUNTER — Other Ambulatory Visit: Payer: Self-pay | Admitting: Radiology

## 2017-11-16 ENCOUNTER — Telehealth: Payer: Self-pay

## 2017-11-16 NOTE — Telephone Encounter (Signed)
Copied from [place] [phone]. Topic: Inquiry >> Nov 16, 2017  9:09 AM Pricilla Handler wrote: Reason for CRM: Patient wants a call back from Dr. Charlett Blake and/or her nurse concerning the medication AmLODipine (NORVASC) 10 MG tablet RECALL. Patient stated that she has stopped taking the medication until she can speak with Dr. Charlett Blake. Patient stopped taking the medication last Wednesday (11/10/2017). Patient called last Thursday requesting a call back from Dr. Charlett Blake or her Nurse. Please call patient today ASAP.       Thank You!!!

## 2017-11-17 ENCOUNTER — Ambulatory Visit (HOSPITAL_COMMUNITY)
Admission: RE | Admit: 2017-11-17 | Discharge: 2017-11-17 | Disposition: A | Discharge status: Home / Self Care | Payer: Medicaid Other | Source: Ambulatory Visit | Attending: Hematology & Oncology | Admitting: Hematology & Oncology

## 2017-11-17 ENCOUNTER — Encounter (HOSPITAL_COMMUNITY): Payer: Self-pay

## 2017-11-17 DIAGNOSIS — Z9889 Other specified postprocedural states: Secondary | ICD-10-CM | POA: Insufficient documentation

## 2017-11-17 DIAGNOSIS — Z888 Allergy status to other drugs, medicaments and biological substances status: Secondary | ICD-10-CM | POA: Diagnosis not present

## 2017-11-17 DIAGNOSIS — Z79899 Other long term (current) drug therapy: Secondary | ICD-10-CM | POA: Diagnosis not present

## 2017-11-17 DIAGNOSIS — Z950 Presence of cardiac pacemaker: Secondary | ICD-10-CM | POA: Diagnosis not present

## 2017-11-17 DIAGNOSIS — C9000 Multiple myeloma not having achieved remission: Secondary | ICD-10-CM | POA: Diagnosis present

## 2017-11-17 DIAGNOSIS — Z8673 Personal history of transient ischemic attack (TIA), and cerebral infarction without residual deficits: Secondary | ICD-10-CM | POA: Insufficient documentation

## 2017-11-17 DIAGNOSIS — Z885 Allergy status to narcotic agent status: Secondary | ICD-10-CM | POA: Insufficient documentation

## 2017-11-17 DIAGNOSIS — E785 Hyperlipidemia, unspecified: Secondary | ICD-10-CM | POA: Diagnosis not present

## 2017-11-17 DIAGNOSIS — D472 Monoclonal gammopathy: Secondary | ICD-10-CM | POA: Diagnosis not present

## 2017-11-17 DIAGNOSIS — K219 Gastro-esophageal reflux disease without esophagitis: Secondary | ICD-10-CM | POA: Insufficient documentation

## 2017-11-17 DIAGNOSIS — G4733 Obstructive sleep apnea (adult) (pediatric): Secondary | ICD-10-CM | POA: Insufficient documentation

## 2017-11-17 DIAGNOSIS — Z9049 Acquired absence of other specified parts of digestive tract: Secondary | ICD-10-CM | POA: Diagnosis not present

## 2017-11-17 DIAGNOSIS — I110 Hypertensive heart disease with heart failure: Secondary | ICD-10-CM | POA: Diagnosis not present

## 2017-11-17 DIAGNOSIS — Z90710 Acquired absence of both cervix and uterus: Secondary | ICD-10-CM | POA: Insufficient documentation

## 2017-11-17 DIAGNOSIS — I509 Heart failure, unspecified: Secondary | ICD-10-CM | POA: Insufficient documentation

## 2017-11-17 DIAGNOSIS — Z7982 Long term (current) use of aspirin: Secondary | ICD-10-CM | POA: Insufficient documentation

## 2017-11-17 LAB — CBC WITH DIFFERENTIAL/PLATELET
BASOS PCT: 1 %
Basophils Absolute: 0 10*3/uL (ref 0.0–0.1)
EOS ABS: 0.1 10*3/uL (ref 0.0–0.7)
Eosinophils Relative: 2 %
HCT: 34.5 % — ABNORMAL LOW (ref 36.0–46.0)
Hemoglobin: 11.4 g/dL — ABNORMAL LOW (ref 12.0–15.0)
LYMPHS ABS: 1.2 10*3/uL (ref 0.7–4.0)
Lymphocytes Relative: 34 %
MCH: 28.7 pg (ref 26.0–34.0)
MCHC: 33 g/dL (ref 30.0–36.0)
MCV: 86.9 fL (ref 78.0–100.0)
MONO ABS: 0.2 10*3/uL (ref 0.1–1.0)
MONOS PCT: 7 %
Neutro Abs: 1.9 10*3/uL (ref 1.7–7.7)
Neutrophils Relative %: 56 %
Platelets: 155 10*3/uL (ref 150–400)
RBC: 3.97 MIL/uL (ref 3.87–5.11)
RDW: 14.9 % (ref 11.5–15.5)
WBC: 3.4 10*3/uL — ABNORMAL LOW (ref 4.0–10.5)

## 2017-11-17 LAB — PROTIME-INR
INR: 0.99
Prothrombin Time: 13 seconds (ref 11.4–15.2)

## 2017-11-17 MED ORDER — FENTANYL CITRATE (PF) 100 MCG/2ML IJ SOLN
INTRAMUSCULAR | Status: AC
  Filled 2017-11-17: qty 2

## 2017-11-17 MED ORDER — LIDOCAINE HCL (PF) 1 % IJ SOLN
INTRAMUSCULAR | Status: AC | PRN
  Administered 2017-11-17: 30 mL

## 2017-11-17 MED ORDER — MIDAZOLAM HCL 2 MG/2ML IJ SOLN
INTRAMUSCULAR | Status: AC
  Filled 2017-11-17: qty 2

## 2017-11-17 MED ORDER — MIDAZOLAM HCL 2 MG/2ML IJ SOLN
INTRAMUSCULAR | Status: AC | PRN
  Administered 2017-11-17 (×2): 1 mg via INTRAVENOUS

## 2017-11-17 MED ORDER — SODIUM CHLORIDE 0.9 % IV SOLN
INTRAVENOUS | Status: DC
  Administered 2017-11-17: 07:00:00 via INTRAVENOUS

## 2017-11-17 MED ORDER — FENTANYL CITRATE (PF) 100 MCG/2ML IJ SOLN
INTRAMUSCULAR | Status: AC | PRN
  Administered 2017-11-17 (×2): 50 ug via INTRAVENOUS

## 2017-11-17 NOTE — Procedures (Signed)
CT guided bone marrow biopsy from right ilium.  2 aspirates and 1 core.  Minimal blood loss and no immediate complication.

## 2017-11-17 NOTE — Discharge Instructions (Signed)
Moderate Conscious Sedation, Adult, Care After °These instructions provide you with information about caring for yourself after your procedure. Your health care provider may also give you more specific instructions. Your treatment has been planned according to current medical practices, but problems sometimes occur. Call your health care provider if you have any problems or questions after your procedure. °What can I expect after the procedure? °After your procedure, it is common: °· To feel sleepy for several hours. °· To feel clumsy and have poor balance for several hours. °· To have poor judgment for several hours. °· To vomit if you eat too soon. ° °Follow these instructions at home: °For at least 24 hours after the procedure: ° °· Do not: °? Participate in activities where you could fall or become injured. °? Drive. °? Use heavy machinery. °? Drink alcohol. °? Take sleeping pills or medicines that cause drowsiness. °? Make important decisions or sign legal documents. °? Take care of children on your own. °· Rest. °Eating and drinking °· Follow the diet recommended by your health care provider. °· If you vomit: °? Drink water, juice, or soup when you can drink without vomiting. °? Make sure you have little or no nausea before eating solid foods. °General instructions °· Have a responsible adult stay with you until you are awake and alert. °· Take over-the-counter and prescription medicines only as told by your health care provider. °· If you smoke, do not smoke without supervision. °· Keep all follow-up visits as told by your health care provider. This is important. °Contact a health care provider if: °· You keep feeling nauseous or you keep vomiting. °· You feel light-headed. °· You develop a rash. °· You have a fever. °Get help right away if: °· You have trouble breathing. °This information is not intended to replace advice given to you by your health care provider. Make sure you discuss any questions you have  with your health care provider. °Document Released: 08/16/2013 Document Revised: 03/30/2016 Document Reviewed: 02/15/2016 °Elsevier Interactive Patient Education © 2018 [employer] ° ° °Bone Marrow Aspiration and Bone Marrow Biopsy, Adult, Care After °This sheet gives you information about how to care for yourself after your procedure. Your health care provider may also give you more specific instructions. If you have problems or questions, contact your health care provider. °What can I expect after the procedure? °After the procedure, it is common to have: °· Mild pain and tenderness. °· Swelling. °· Bruising. ° °Follow these instructions at home: °· Take over-the-counter or prescription medicines only as told by your health care provider. °· Do not take baths, swim, or use a hot tub until your health care provider approves. Ask if you can take a shower or have a sponge bath.  You may shower tomorrow. °· Follow instructions from your health care provider about how to take care of the puncture site. Make sure you: °? Wash your hands with soap and water before you change your bandage (dressing). If soap and water are not available, use hand sanitizer. °? Change your dressing as told by your health care provider.  You may remove your dressing tomorrow. °· Check your puncture site every day for signs of infection. Check for: °? More redness, swelling, or pain. °? More fluid or blood. °? Warmth. °? Pus or a bad smell. °· Return to your normal activities as told by your health care provider. Ask your health care provider what activities are safe for you. °· Do not drive   for 24 hours if you were given a medicine to help you relax (sedative). °· Keep all follow-up visits as told by your health care provider. This is important. °Contact a health care provider if: °· You have more redness, swelling, or pain around the puncture site. °· You have more fluid or blood coming from the puncture site. °· Your puncture site feels  warm to the touch. °· You have pus or a bad smell coming from the puncture site. °· You have a fever. °· Your pain is not controlled with medicine. °This information is not intended to replace advice given to you by your health care provider. Make sure you discuss any questions you have with your health care provider. °Document Released: 05/15/2005 Document Revised: 05/15/2016 Document Reviewed: 04/08/2016 °Elsevier Interactive Patient Education © 2018 [employer]. ° °

## 2017-11-17 NOTE — H&P (Signed)
Referring Physician(s): Ennever,Peter R  Supervising Physician: Markus Daft  Patient Status:  [facility]  Chief Complaint:  "I'm here for a bone biopsy"  Subjective: Patient familiar to IR service from prior cerebral arteriogram in 2006.  She has a history of  MGUS with rising IgG levels and M spike of 2.5. She presents today for CT-guided bone marrow biopsy to rule out transformation to myeloma.  She has recently been treated for both pneumonia and C. difficile colitis.  She currently denies fever, headache, chest pain, dyspnea, cough, nausea, vomiting.  She does have some intermittent abdominal and back discomfort, few loose stools. Past Medical History:  Diagnosis Date  . Abnormal SPEP 07/26/2016  . Arthritis    knees, hands  . Back pain 07/14/2016  . Bowel obstruction ([place]) 11/2014  . C. difficile diarrhea   . CHF (congestive heart failure) ([place])   . Congestive heart failure ([place]) 02/24/2015   S/p pacemaker  . Depression   . Elevated sed rate 08/15/2017  . Epigastric pain 03/22/2017  . GERD (gastroesophageal reflux disease)   . Hyperlipidemia   . Hypertension   . Low back pain 07/14/2016  . 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 Myalgia 12/31/2016  . Obstructive sleep apnea 03/31/2015  . SOB (shortness of breath) 04/09/2016  . Stroke [facility])    TIAs   Past Surgical History:  Procedure Laterality Date  . ABDOMINAL HYSTERECTOMY     menorraghia, 2006, total  . CHOLECYSTECTOMY    . KNEE SURGERY     right, repair torn torn cartialage  . PACEMAKER INSERTION  08/2014  . TONSILLECTOMY    . TUBAL LIGATION       Allergies: Benazepril; Codeine; Morphine; and Ondansetron hcl  Medications: Prior to Admission medications   Medication Sig Start Date End Date Taking? Authorizing Provider  acetaminophen (TYLENOL) 325 MG tablet Take 650 mg by mouth. 06/07/17  Yes [provider]  albuterol (PROVENTIL HFA;VENTOLIN HFA) 108 (90 Base) MCG/ACT inhaler  Inhale 2 puffs into the lungs every 6 (six) hours as needed for wheezing or shortness of breath. 10/11/17  Yes Mosie Lukes, MD  amLODipine (NORVASC) 10 MG tablet Take 1 tablet (10 mg total) by mouth daily. 10/11/17 10/11/18 Yes Mosie Lukes, MD  aspirin EC 81 MG tablet Take 1 tablet (81 mg total) by mouth daily. 10/11/17  Yes Mosie Lukes, MD  carvedilol (COREG) 25 MG tablet Take 1 tablet (25 mg total) by mouth 2 (two) times daily with a meal. 10/11/17  Yes Mosie Lukes, MD  cefdinir (OMNICEF) 300 MG capsule Take 300 mg by mouth 2 (two) times daily.   Yes [provider]  cephALEXin (KEFLEX) 500 MG capsule Take 500 mg by mouth 4 (four) times daily.   Yes [provider]  Cholecalciferol (VITAMIN D3) 5000 UNITS CAPS Take 1 capsule by mouth daily.   Yes [provider]  conjugated estrogens (PREMARIN) vaginal cream Place vaginally. 01/31/14  Yes [provider]  docusate sodium (COLACE) 50 MG capsule Take 50 mg by mouth 2 (two) times daily.   Yes [provider]  ergocalciferol (VITAMIN D2) 50000 units capsule Take 1 capsule (50,000 Units total) by mouth once a week. 04/09/16  Yes Mosie Lukes, MD  escitalopram (LEXAPRO) 10 MG tablet Take 10 mg by mouth daily.   Yes [provider]  famotidine (PEPCID) 40 MG tablet Take 1 tablet (40 mg total) by mouth at bedtime. 06/11/17  Yes Mosie Lukes, MD  fluticasone (FLONASE) 50 MCG/ACT nasal spray Place 2 sprays into both nostrils daily. 10/11/17  Yes Mosie Lukes, MD  furosemide (LASIX) 20 MG tablet Take 1 tablet (20 mg total) by mouth daily as needed (WT gain>3# in 24 HR). 10/11/17  Yes Mosie Lukes, MD  gabapentin (NEURONTIN) 300 MG capsule Take 1 capsule (300 mg total) by mouth 3 (three) times daily. 07/14/16  Yes Mosie Lukes, MD  HYDROcodone-acetaminophen (NORCO) 5-325 MG tablet Take 1 tablet by mouth 3 (three) times daily as needed for moderate pain. 10/11/17  Yes Mosie Lukes, MD    metoCLOPramide (REGLAN) 10 MG tablet Take 1 tablet (10 mg total) by mouth 4 (four) times daily -  before meals and at bedtime. 06/11/17  Yes [name], MD  modafinil (PROVIGIL) 100 MG tablet Take 1 tablet (100 mg total) by mouth daily. 10/11/15  Yes Mosie Lukes, MD  Multiple Vitamins-Minerals (CENTRUM SILVER ADULT 50+ PO) Take by mouth.   Yes [provider]  pantoprazole (PROTONIX) 40 MG tablet Take 1 tablet (40 mg total) by mouth 2 (two) times daily. 10/11/17  Yes Mosie Lukes, MD  potassium chloride SA (KLOR-CON M20) 20 MEQ tablet Take 1 tablet (20 mEq total) by mouth daily. 10/11/17  Yes Mosie Lukes, MD  silver sulfADIAZINE (SILVADENE) 1 % cream Apply 1 application topically daily. 04/15/16  Yes Mosie Lukes, MD  sucralfate (CARAFATE) 1 g tablet Take 1 tablet (1 g total) by mouth 4 (four) times daily -  with meals and at bedtime. prn 10/11/17  Yes Mosie Lukes, MD  tiZANidine (ZANAFLEX) 4 MG tablet Take 0.5-1 tablets (2-4 mg total) by mouth at bedtime as needed for muscle spasms. 03/22/17  Yes Mosie Lukes, MD  topiramate (TOPAMAX) 25 MG tablet Take 1 tablet (25 mg total) by mouth 2 (two) times daily. 10/11/17  Yes Mosie Lukes, MD  triamterene-hydrochlorothiazide (MAXZIDE-25) 37.5-25 MG tablet Take 1 tablet by mouth daily. 10/11/17  Yes Mosie Lukes, MD  vancomycin (VANCOCIN) 125 MG capsule Take 1 capsule (125 mg total) 4 (four) times daily by mouth. For (7) seven days 09/21/17  Yes Mosie Lukes, MD  zolpidem (AMBIEN) 10 MG tablet Take 10 mg by mouth at bedtime as needed for sleep.   Yes [provider]  FLUoxetine (PROZAC) 20 MG tablet 10 mg daily x 7 days then increase to 20 mg po daily 11/11/17   Mosie Lukes, MD     Vital Signs: Blood pressure 165/110, heart rate 98, respirations 18, temp 98.4, O2 sat 99% room air    Physical Exam awake, alert.  Chest clear to auscultation bilaterally.  Left chest wall pacer.  Heart with regular rate and  rhythm.  Abdomen soft, positive bowel sounds, mild epigastric tenderness.  No lower extremity edema.  Imaging: No results found.  Labs:  CBC: Recent Labs    08/20/17 0845 10/25/17 1546 11/08/17 1127 11/17/17 0714  WBC 3.6* 2.9* 2.5* 3.4*  HGB 11.4* 11.2* 11.1* 11.4*  HCT 34.7* 34.3* 34.1* 34.5*  PLT 156.0 135.0* 145 155    COAGS: Recent Labs    11/17/17 0714  INR 0.99    BMP: Recent Labs    07/27/17 1353 08/20/17 0845 10/25/17 1546 11/08/17 1127  NA 138 138 138 143  K 3.4* 3.6 3.8 4.0  CL 106 103 106 107  CO2 _0 GLUCOSE 117* 95 101* 96  BUN _0 CALCIUM 9.4 8.8 9.2 9.9  CREATININE 0.66 0.67 0.74 0.9    LIVER FUNCTION TESTS: Recent Labs    07/27/17 1353 08/20/17 0845 10/25/17 1546 11/08/17 1127  BILITOT 0.3 0.4 0.2 0.50  AST 17 44* 22 35  ALT _1 36  ALKPHOS 43 37* 40 44  PROT 9.4* 9.5* 9.7* 9.4*  10.3*  ALBUMIN 3.4* 3.5 3.5 3.2*    Assessment and Plan: Pt with history of  MGUS with rising IgG levels and M spike of 2.5.  She presents today for CT-guided bone marrow biopsy to rule out transformation to myeloma.  She has recently been treated for both pneumonia and C. difficile colitis. Risks and benefits discussed with the patient including, but not limited to bleeding, infection, damage to adjacent structures or low yield requiring additional tests.All of the patient's questions were answered, patient is agreeable to proceed. Consent signed and in chart.    Electronically Signed: D. Rowe Robert, PA-C 11/17/2017, 8:29 AM   I spent a total of 20 minutes at the the patient's bedside AND on the patient's hospital floor or unit, greater than 50% of which was counseling/coordinating care for CT-guided bone marrow biopsy

## 2017-11-18 ENCOUNTER — Telehealth: Payer: Self-pay | Admitting: *Deleted

## 2017-11-18 MED ORDER — VERAPAMIL HCL ER 120 MG PO CP24: 120.0000 mg | ORAL_CAPSULE | Freq: Every day | ORAL | 1 refills | Status: DC

## 2017-11-18 NOTE — Telephone Encounter (Signed)
Received Physician Supplemental Orders from [facility]; forwarded to provider/SLS 01/10

## 2017-11-18 NOTE — Addendum Note (Signed)
Addended by: Magdalene Molly A on: 11/18/2017 09:22 AM   Modules accepted: Orders

## 2017-11-18 NOTE — Telephone Encounter (Signed)
Patient called the pharmacy and they explained it was not the medication she was on, however she should conitinue to take her meds

## 2017-11-19 ENCOUNTER — Telehealth: Payer: Self-pay | Admitting: *Deleted

## 2017-11-19 NOTE — Telephone Encounter (Signed)
Spoke with patient she did contact her pharmacy and was told her medication was not on the recall list

## 2017-11-19 NOTE — Telephone Encounter (Signed)
Received Home Health Certification and Plan of Care; forwarded to provider/SLS 01/11 

## 2017-11-30 ENCOUNTER — Encounter: Payer: Self-pay | Admitting: Hematology & Oncology

## 2017-12-06 ENCOUNTER — Inpatient Hospital Stay: Payer: Medicaid Other

## 2017-12-06 ENCOUNTER — Encounter (HOSPITAL_COMMUNITY): Payer: Self-pay

## 2017-12-06 ENCOUNTER — Inpatient Hospital Stay: Payer: Medicaid Other | Admitting: Hematology & Oncology

## 2017-12-07 ENCOUNTER — Inpatient Hospital Stay (HOSPITAL_BASED_OUTPATIENT_CLINIC_OR_DEPARTMENT_OTHER): Payer: Medicaid Other | Admitting: Hematology & Oncology

## 2017-12-07 ENCOUNTER — Encounter: Payer: Self-pay | Admitting: Hematology & Oncology

## 2017-12-07 ENCOUNTER — Other Ambulatory Visit: Payer: Self-pay

## 2017-12-07 ENCOUNTER — Inpatient Hospital Stay: Payer: Medicaid Other | Attending: Hematology & Oncology

## 2017-12-07 DIAGNOSIS — D472 Monoclonal gammopathy: Secondary | ICD-10-CM | POA: Insufficient documentation

## 2017-12-07 DIAGNOSIS — D801 Nonfamilial hypogammaglobulinemia: Secondary | ICD-10-CM

## 2017-12-07 DIAGNOSIS — C9000 Multiple myeloma not having achieved remission: Secondary | ICD-10-CM

## 2017-12-07 HISTORY — DX: Nonfamilial hypogammaglobulinemia: D80.1

## 2017-12-07 LAB — CMP (CANCER CENTER ONLY)
ALBUMIN: 3.3 g/dL — AB (ref 3.5–5.0)
ALK PHOS: 53 U/L (ref 40–150)
ALT: 18 U/L (ref 0–55)
ANION GAP: 6 (ref 3–11)
AST: 26 U/L (ref 5–34)
BUN: 20 mg/dL (ref 7–26)
CALCIUM: 9.2 mg/dL (ref 8.4–10.4)
CHLORIDE: 109 mmol/L (ref 98–109)
CO2: 23 mmol/L (ref 22–29)
Creatinine: 0.84 mg/dL (ref 0.60–1.10)
GFR, Estimated: >60 mL/min (ref >60)
GLUCOSE: 101 mg/dL (ref 70–140)
POTASSIUM: 3.7 mmol/L (ref 3.3–4.7)
SODIUM: 138 mmol/L (ref 136–145)
Total Bilirubin: 0.2 mg/dL (ref 0.2–1.2)
Total Protein: 10.4 g/dL — ABNORMAL HIGH (ref 6.4–8.3)

## 2017-12-07 LAB — CBC WITH DIFFERENTIAL (CANCER CENTER ONLY)
BASOS PCT: 1 %
Basophils Absolute: 0 10*3/uL (ref 0.0–0.1)
EOS ABS: 0.1 10*3/uL (ref 0.0–0.5)
EOS PCT: 2 %
HCT: 33.6 % — ABNORMAL LOW (ref 34.8–46.6)
HEMOGLOBIN: 10.9 g/dL — AB (ref 11.6–15.9)
Lymphocytes Relative: 41 %
Lymphs Abs: 1.3 10*3/uL (ref 0.9–3.3)
MCH: 28.5 pg (ref 26.0–34.0)
MCHC: 32.4 g/dL (ref 32.0–36.0)
MCV: 88 fL (ref 81.0–101.0)
Monocytes Absolute: 0.2 10*3/uL (ref 0.1–0.9)
Monocytes Relative: 6 %
NEUTROS PCT: 50 %
Neutro Abs: 1.5 10*3/uL (ref 1.5–6.5)
PLATELETS: 141 10*3/uL — AB (ref 145–400)
RBC: 3.82 MIL/uL (ref 3.70–5.32)
RDW: 14.7 % (ref 11.1–15.7)
WBC: 3 10*3/uL — AB (ref 3.9–10.3)

## 2017-12-07 NOTE — Progress Notes (Signed)
Hematology and Oncology Follow Up Visit  Tami [name] [id number] [birth year] 62 y.o. 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 is back for follow-up.  She actually looks quite good.  She feels pretty good.  We did go ahead and do a bone marrow biopsy on her.  This was done on November 17, 2017.  The bone marrow report ([id number]) showed 10% plasma cells.  They were kappa restricted.  The they were associated with a couple variably sized but focally large plasma cell aggregates.  The plasma cells did not appear to be too immature.  Thankfully, the cytogenetics were all negative as was the [facility] panel.  Her last monoclonal levels showed an M spike of 2.5 g/dL.  Her IgG level was 4100 mg/dL.  Her kappa light chain was 11 mg/dL.  She I suspect has abnormal immunoglobulins.  As such, I talked to her about the possibility of using IVIG.  I think IVIG may not be a bad idea for her to see if this cannot decrease her risk of infections.  She has had a pretty good appetite.  She had no nausea or vomiting.  She has had no problems with bowels or bladder.  There is been no leg swelling.  She has had no rashes.  She has occasional pain in the abdomen.   Currently, her performance status is ECOG 1.   Medications:  Current Outpatient Medications:  .  acetaminophen (TYLENOL) 325 MG tablet, Take 650 mg by mouth., Disp: , Rfl:  .  albuterol (PROVENTIL HFA;VENTOLIN HFA) 108 (90 Base) MCG/ACT inhaler, Inhale 2 puffs into the lungs every 6 (six) hours as needed for wheezing or shortness of breath., Disp: 1 Inhaler, Rfl: 1 .  amLODipine (NORVASC) 10 MG tablet, Take 1 tablet (10 mg total) by mouth daily., Disp: 90 tablet, Rfl: 1 .  aspirin EC 81 MG tablet, Take 1 tablet (81 mg total) by mouth daily., Disp: 30 tablet, Rfl: 1 .  carvedilol (COREG) 25 MG tablet, Take 1 tablet  (25 mg total) by mouth 2 (two) times daily with a meal., Disp: 180 tablet, Rfl: 1 .  Cholecalciferol (VITAMIN D3) 5000 UNITS CAPS, Take 1 capsule by mouth daily., Disp: , Rfl:  .  conjugated estrogens (PREMARIN) vaginal cream, Place vaginally., Disp: , Rfl:  .  docusate sodium (COLACE) 50 MG capsule, Take 50 mg by mouth 2 (two) times daily., Disp: , Rfl:  .  ergocalciferol (VITAMIN D2) 50000 units capsule, Take 1 capsule (50,000 Units total) by mouth once a week., Disp: 12 capsule, Rfl: 1 .  famotidine (PEPCID) 40 MG tablet, Take 1 tablet (40 mg total) by mouth at bedtime., Disp: 90 tablet, Rfl: 1 .  fluticasone (FLONASE) 50 MCG/ACT nasal spray, Place 2 sprays into both nostrils daily., Disp: 16 g, Rfl: 6 .  furosemide (LASIX) 20 MG tablet, Take 1 tablet (20 mg total) by mouth daily as needed (WT gain>3# in 24 HR)., Disp: 90 tablet, Rfl: 1 .  metoCLOPramide (REGLAN) 10 MG tablet, Take 1 tablet (10 mg total) by mouth 4 (four) times daily -  before meals and at bedtime., Disp: 60 tablet, Rfl: 4 .  modafinil (PROVIGIL) 100 MG tablet, Take 1 tablet (100 mg total) by mouth daily., Disp: 30 tablet, Rfl: 5 .  Multiple Vitamins-Minerals (CENTRUM SILVER ADULT 50+ PO), Take by mouth., Disp: ,  Rfl:  .  pantoprazole (PROTONIX) 40 MG tablet, Take 1 tablet (40 mg total) by mouth 2 (two) times daily., Disp: 180 tablet, Rfl: 1 .  potassium chloride SA (KLOR-CON M20) 20 MEQ tablet, Take 1 tablet (20 mEq total) by mouth daily., Disp: 30 tablet, Rfl: 1 .  silver sulfADIAZINE (SILVADENE) 1 % cream, Apply 1 application topically daily., Disp: 50 g, Rfl: 1 .  sucralfate (CARAFATE) 1 g tablet, Take 1 tablet (1 g total) by mouth 4 (four) times daily -  with meals and at bedtime. prn, Disp: 120 tablet, Rfl: 1 .  topiramate (TOPAMAX) 25 MG tablet, Take 1 tablet (25 mg total) by mouth 2 (two) times daily., Disp: 60 tablet, Rfl: 2 .  triamterene-hydrochlorothiazide (MAXZIDE-25) 37.5-25 MG tablet, Take 1 tablet by mouth daily.,  Disp: 90 tablet, Rfl: 1 .  escitalopram (LEXAPRO) 10 MG tablet, Take 10 mg by mouth daily., Disp: , Rfl:  .  gabapentin (NEURONTIN) 300 MG capsule, Take 1 capsule (300 mg total) by mouth 3 (three) times daily. (Patient not taking: Reported on 12/07/2017), Disp: 90 capsule, Rfl: 3 .  HYDROcodone-acetaminophen (NORCO) 5-325 MG tablet, Take 1 tablet by mouth 3 (three) times daily as needed for moderate pain. (Patient not taking: Reported on 12/07/2017), Disp: 15 tablet, Rfl: 0 .  tiZANidine (ZANAFLEX) 4 MG tablet, Take 0.5-1 tablets (2-4 mg total) by mouth at bedtime as needed for muscle spasms. (Patient not taking: Reported on 12/07/2017), Disp: 30 tablet, Rfl: 1 .  zolpidem (AMBIEN) 10 MG tablet, Take 10 mg by mouth at bedtime as needed for sleep., Disp: , Rfl:   Allergies:  Allergies  Allergen Reactions  . Benazepril Anaphylaxis    angioedema  . Codeine Nausea And Vomiting  . Morphine     Redness and hives noted post IV admin on 07/05/17  . Ondansetron Hcl     Redness and hives post IV admin on 07/05/17    Past Medical History, Surgical history, Social history, and Family History were reviewed and updated.  Review of Systems: Review of Systems  Constitutional: Negative.   HENT: Negative.   Eyes: Negative.   Respiratory: Negative.   Cardiovascular: Negative.   Gastrointestinal: Negative.   Genitourinary: Negative.   Musculoskeletal: Negative.   Skin: Negative.   Neurological: Negative.   Endo/Heme/Allergies: Negative.   Psychiatric/Behavioral: Negative.      Physical Exam:  weight is 175 lb 12 oz (79.7 kg). Her oral temperature is 97.6 F (36.4 C). Her blood pressure is 133/91 (abnormal) and her pulse is 89. Her respiration is 20 and oxygen saturation is 98%.   Wt Readings from Last 3 Encounters:  12/07/17 175 lb 12 oz (79.7 kg)  11/17/17 164 lb (74.4 kg)  11/11/17 174 lb 6.4 oz (79.1 kg)     Physical Exam  Constitutional: She is oriented to person, place, and time.    HENT:  Head: Normocephalic and atraumatic.  Mouth/Throat: Oropharynx is clear and moist.  Eyes: EOM are normal. Pupils are equal, round, and reactive to light.  Neck: Normal range of motion.  Cardiovascular: Normal rate, regular rhythm and normal heart sounds.  Pulmonary/Chest: Effort normal and breath sounds normal.  Abdominal: Soft. Bowel sounds are normal.  Musculoskeletal: Normal range of motion. She exhibits no edema, tenderness or deformity.  Lymphadenopathy:    She has no cervical adenopathy.  Neurological: She is alert and oriented to person, place, and time.  Skin: Skin is warm and dry. No rash noted. No erythema.  Psychiatric:  She has a normal mood and affect. Her behavior is normal. Judgment and thought content normal.  Vitals reviewed.  a.   Lab Results  Component Value Date   WBC 3.0 (L) 12/07/2017   HGB 11.4 (L) 11/17/2017   HCT 33.6 (L) 12/07/2017   MCV 88.0 12/07/2017   PLT 141 (L) 12/07/2017     Chemistry      Component Value Date/Time   NA 143 11/08/2017 1127   NA 140 12/31/2016 1000   K 4.0 11/08/2017 1127   K 3.6 12/31/2016 1000   CL 107 11/08/2017 1127   CO2 25 11/08/2017 1127   CO2 24 12/31/2016 1000   BUN 19 11/08/2017 1127   BUN 22.3 12/31/2016 1000   CREATININE 0.9 11/08/2017 1127   CREATININE 0.8 12/31/2016 1000      Component Value Date/Time   CALCIUM 9.9 11/08/2017 1127   CALCIUM 9.5 12/31/2016 1000   ALKPHOS 44 11/08/2017 1127   ALKPHOS 51 12/31/2016 1000   AST 35 11/08/2017 1127   AST 28 12/31/2016 1000   ALT 36 11/08/2017 1127   ALT 16 12/31/2016 1000   BILITOT 0.50 11/08/2017 1127   BILITOT 0.46 12/31/2016 1000         Impression and Plan: Ms. Droll is a 62 year old African-American female. She has a monoclonal gammopathy. I would say this is an IgG kappa MGUS.   Given the results of the bone marrow biopsy, I would have to say that she probably would be in the category of smoldering myeloma.  Given that fact, I just do not  feel compelled to give her systemic chemotherapy for this.  I just do not see that there would be a huge upside to giving her chemotherapy.  I do feel that IVIG would not be a bad idea.  Again, if we can help her quality of life and decrease her risk of infections, then IVIG would be worthwhile.  I talked to her about this.  I spent about 40 minutes with her.  Over 50% of the time was face-to-face with her.  I reviewed the bone marrow biopsy results.  I reviewed her labs.  I explained why I did not feel she had to embark upon chemotherapy as of yet.  She understood all of this.  We will see about getting the IVIG started in a couple weeks.  I would like to see her back in about 6 weeks.  I think that if she does well with the IVIG, we could consider doing this every couple months.  She is in agreement.   [name], MD 1/29/20192:48 PM

## 2017-12-08 ENCOUNTER — Emergency Department (HOSPITAL_BASED_OUTPATIENT_CLINIC_OR_DEPARTMENT_OTHER)
Admission: EM | Admit: 2017-12-08 | Discharge: 2017-12-08 | Disposition: A | Discharge status: Home / Self Care | Payer: Medicaid Other | Attending: Emergency Medicine | Admitting: Emergency Medicine

## 2017-12-08 ENCOUNTER — Other Ambulatory Visit: Payer: Self-pay

## 2017-12-08 ENCOUNTER — Encounter (HOSPITAL_BASED_OUTPATIENT_CLINIC_OR_DEPARTMENT_OTHER): Payer: Self-pay

## 2017-12-08 DIAGNOSIS — M545 Low back pain: Secondary | ICD-10-CM | POA: Insufficient documentation

## 2017-12-08 DIAGNOSIS — N390 Urinary tract infection, site not specified: Secondary | ICD-10-CM | POA: Diagnosis not present

## 2017-12-08 DIAGNOSIS — R300 Dysuria: Secondary | ICD-10-CM | POA: Diagnosis present

## 2017-12-08 DIAGNOSIS — R350 Frequency of micturition: Secondary | ICD-10-CM | POA: Insufficient documentation

## 2017-12-08 DIAGNOSIS — Z79899 Other long term (current) drug therapy: Secondary | ICD-10-CM | POA: Insufficient documentation

## 2017-12-08 DIAGNOSIS — R3915 Urgency of urination: Secondary | ICD-10-CM | POA: Insufficient documentation

## 2017-12-08 DIAGNOSIS — I509 Heart failure, unspecified: Secondary | ICD-10-CM | POA: Diagnosis not present

## 2017-12-08 DIAGNOSIS — Z7982 Long term (current) use of aspirin: Secondary | ICD-10-CM | POA: Diagnosis not present

## 2017-12-08 DIAGNOSIS — Z8673 Personal history of transient ischemic attack (TIA), and cerebral infarction without residual deficits: Secondary | ICD-10-CM | POA: Diagnosis not present

## 2017-12-08 DIAGNOSIS — I110 Hypertensive heart disease with heart failure: Secondary | ICD-10-CM | POA: Diagnosis not present

## 2017-12-08 LAB — URINALYSIS, ROUTINE W REFLEX MICROSCOPIC
Bilirubin Urine: NEGATIVE
Glucose, UA: NEGATIVE mg/dL
KETONES UR: NEGATIVE mg/dL
NITRITE: NEGATIVE
PROTEIN: NEGATIVE mg/dL
Specific Gravity, Urine: 1.025 (ref 1.005–1.030)
pH: 6 (ref 5.0–8.0)

## 2017-12-08 LAB — IGG, IGA, IGM
IGG (IMMUNOGLOBIN G), SERUM: 4709 mg/dL — AB (ref 700–1600)
IgA: 330 mg/dL (ref 87–352)
IgM (Immunoglobulin M), Srm: 171 mg/dL (ref 26–217)

## 2017-12-08 LAB — URINALYSIS, MICROSCOPIC (REFLEX)

## 2017-12-08 LAB — KAPPA/LAMBDA LIGHT CHAINS
KAPPA FREE LGHT CHN: 120.9 mg/L — AB (ref 3.3–19.4)
Kappa, lambda light chain ratio: 9.45 — ABNORMAL HIGH (ref 0.26–1.65)
Lambda free light chains: 12.8 mg/L (ref 5.7–26.3)

## 2017-12-08 LAB — BETA 2 MICROGLOBULIN, SERUM: BETA 2 MICROGLOBULIN: 2.1 mg/L (ref 0.6–2.4)

## 2017-12-08 MED ORDER — SULFAMETHOXAZOLE-TRIMETHOPRIM 800-160 MG PO TABS: 1.0000 | ORAL_TABLET | Freq: Two times a day (BID) | ORAL | 0 refills | Status: AC

## 2017-12-08 MED FILL — SULFAMETHOXAZOLE/TMP DS TAB: 800-160 | 5 days supply | Qty: 10 | Fill #0

## 2017-12-08 NOTE — Discharge Instructions (Signed)
Take Bactrim twice daily for 5 days.  Make sure to finish all this medication.  You will be called in 2-3 days if there needs to be a change in your antibiotic.  Please return to the emergency department if you develop any new or worsening symptoms.  If your symptoms are continuing after completing the antibiotic, please follow-up with your doctor.

## 2017-12-08 NOTE — ED Provider Notes (Signed)
[facility] EMERGENCY DEPARTMENT Provider Note   CSN: [id number] Arrival date & time: 12/08/17  1629     History   Chief Complaint Chief Complaint  Patient presents with  . Dysuria    HPI Tami [name] is a 62 y.o. female with history of recurrent UTI, CHF, C. difficile who presents with a 1 day history of dysuria, frequency, and urgency.  She reports that it feels like a urinary tract infection.  She has had some aching in her low back intermittently since onset.  She reports being treated with Keflex, unsuccessfully, 1 month ago.  She was admitted to the hospital soon after that for pneumonia and still had a urinary tract infection and was treated with Rocephin while in the hospital.  She denies any fever, abdominal pain, nausea, vomiting.  She also denies any abnormal vaginal bleeding or discharge.  She has no concern for STD exposure.  HPI  Past Medical History:  Diagnosis Date  . Abnormal SPEP 07/26/2016  . Arthritis    knees, hands  . Back pain 07/14/2016  . Bowel obstruction ([place]) 11/2014  . C. difficile diarrhea   . CHF (congestive heart failure) ([place])   . Congestive heart failure ([place]) 02/24/2015   S/p pacemaker  . Depression   . Elevated sed rate 08/15/2017  . Epigastric pain 03/22/2017  . GERD (gastroesophageal reflux disease)   . Hyperlipidemia   . Hypertension   . 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 Low back pain 07/14/2016  . 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 Myalgia 12/31/2016  . Obstructive sleep apnea 03/31/2015  . SOB (shortness of breath) 04/09/2016  . Stroke [facility])    TIAs    Patient Active Problem List   Diagnosis Date Noted  . 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 Pneumonia 11/11/2017  . Cystitis 10/25/2017  . C. difficile colitis 09/15/2017  . Hypokalemia 08/20/2017  . Anemia 08/20/2017  . Elevated sed rate 08/15/2017  . 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 Epigastric  pain 03/22/2017  . Myalgia 12/31/2016  . 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 SOB (shortness of breath) 07/26/2016  . Abnormal SPEP 07/26/2016  . Low back pain 07/14/2016  . Insomnia 12/29/2015  . Tension headache 10/21/2015  . Muscle spasms of neck 10/18/2015  . Dysuria 10/13/2015  . Sinusitis 07/07/2015  . AP (abdominal pain) 07/07/2015  . Cardiomyopathy ([place]) 04/19/2015  . Chest wall pain 04/19/2015  . Obstructive sleep apnea 03/31/2015  . Congestive heart failure ([place]) 02/24/2015  . Hyperlipidemia   . Hypertension   . Depression   . Stroke ([place])   . GERD (gastroesophageal reflux disease)   . Bowel obstruction ([place]) 11/09/2014    Past Surgical History:  Procedure Laterality Date  . ABDOMINAL HYSTERECTOMY     menorraghia, 2006, total  . CHOLECYSTECTOMY    . KNEE SURGERY     right, repair torn torn cartialage  . PACEMAKER INSERTION  08/2014  . TONSILLECTOMY    . TUBAL LIGATION      OB History    No data available       Home Medications    Prior to Admission medications   Medication Sig Start Date End Date Taking? Authorizing Provider  acetaminophen (TYLENOL) 325 MG tablet Take 650 mg by mouth. 06/07/17   [provider]  albuterol (PROVENTIL HFA;VENTOLIN HFA) 108 (90 Base) MCG/ACT inhaler Inhale 2 puffs into the lungs every 6 (six) hours as needed for  wheezing or shortness of breath. 10/11/17   Mosie Lukes, MD  amLODipine (NORVASC) 10 MG tablet Take 1 tablet (10 mg total) by mouth daily. 10/11/17 10/11/18  Mosie Lukes, MD  aspirin EC 81 MG tablet Take 1 tablet (81 mg total) by mouth daily. 10/11/17   Mosie Lukes, MD  carvedilol (COREG) 25 MG tablet Take 1 tablet (25 mg total) by mouth 2 (two) times daily with a meal. 10/11/17   Mosie Lukes, MD  Cholecalciferol (VITAMIN D3) 5000 UNITS CAPS Take 1 capsule by mouth daily.    [provider]  conjugated estrogens (PREMARIN) vaginal cream Place vaginally.  01/31/14   [provider]  docusate sodium (COLACE) 50 MG capsule Take 50 mg by mouth 2 (two) times daily.    [provider]  ergocalciferol (VITAMIN D2) 50000 units capsule Take 1 capsule (50,000 Units total) by mouth once a week. 04/09/16   Mosie Lukes, MD  escitalopram (LEXAPRO) 10 MG tablet Take 10 mg by mouth daily.    [provider]  famotidine (PEPCID) 40 MG tablet Take 1 tablet (40 mg total) by mouth at bedtime. 06/11/17   Mosie Lukes, MD  fluticasone (FLONASE) 50 MCG/ACT nasal spray Place 2 sprays into both nostrils daily. 10/11/17   Mosie Lukes, MD  furosemide (LASIX) 20 MG tablet Take 1 tablet (20 mg total) by mouth daily as needed (WT gain>3# in 24 HR). 10/11/17   Mosie Lukes, MD  gabapentin (NEURONTIN) 300 MG capsule Take 1 capsule (300 mg total) by mouth 3 (three) times daily. Patient not taking: Reported on 12/07/2017 07/14/16   Mosie Lukes, MD  HYDROcodone-acetaminophen [facility]) 5-325 MG tablet Take 1 tablet by mouth 3 (three) times daily as needed for moderate pain. Patient not taking: Reported on 12/07/2017 10/11/17   Mosie Lukes, MD  metoCLOPramide (REGLAN) 10 MG tablet Take 1 tablet (10 mg total) by mouth 4 (four) times daily -  before meals and at bedtime. 06/11/17   [name], MD  modafinil (PROVIGIL) 100 MG tablet Take 1 tablet (100 mg total) by mouth daily. 10/11/15   Mosie Lukes, MD  Multiple Vitamins-Minerals (CENTRUM SILVER ADULT 50+ PO) Take by mouth.    [provider]  pantoprazole (PROTONIX) 40 MG tablet Take 1 tablet (40 mg total) by mouth 2 (two) times daily. 10/11/17   Mosie Lukes, MD  potassium chloride SA (KLOR-CON M20) 20 MEQ tablet Take 1 tablet (20 mEq total) by mouth daily. 10/11/17   Mosie Lukes, MD  silver sulfADIAZINE (SILVADENE) 1 % cream Apply 1 application topically daily. 04/15/16   Mosie Lukes, MD  sucralfate (CARAFATE) 1 g tablet Take 1 tablet (1 g total) by mouth 4 (four) times daily -   with meals and at bedtime. prn 10/11/17   Mosie Lukes, MD  sulfamethoxazole-trimethoprim (BACTRIM DS,SEPTRA DS) 800-160 MG tablet Take 1 tablet by mouth 2 (two) times daily for 5 days. 12/08/17 12/13/17  Frederica Kuster, PA-C  tiZANidine (ZANAFLEX) 4 MG tablet Take 0.5-1 tablets (2-4 mg total) by mouth at bedtime as needed for muscle spasms. Patient not taking: Reported on 12/07/2017 03/22/17   Mosie Lukes, MD  topiramate (TOPAMAX) 25 MG tablet Take 1 tablet (25 mg total) by mouth 2 (two) times daily. 10/11/17   Mosie Lukes, MD  triamterene-hydrochlorothiazide (MAXZIDE-25) 37.5-25 MG tablet Take 1 tablet by mouth daily. 10/11/17   Mosie Lukes, MD  zolpidem (AMBIEN) 10 MG tablet Take 10 mg by mouth at bedtime as needed for sleep.    [provider]    Family History Family History  Problem Relation Age of Onset  . Diabetes Mother   . Hypertension Mother   . Heart disease Mother        s/p 1 stent  . Hyperlipidemia Mother   . Arthritis Mother   . Cancer Father        COLON  . Colon cancer Father 44  . Irritable bowel syndrome Sister   . Hyperlipidemia Daughter   . Hypertension Daughter   . Hypertension Maternal Grandmother   . Arthritis Maternal Grandmother   . Heart disease Maternal Grandfather        MI  . Hypertension Maternal Grandfather   . Arthritis Maternal Grandfather   . Hypertension Son     Social History Social History   Tobacco Use  . Smoking status: Never Smoker  . Smokeless tobacco: Never Used  Substance Use Topics  . Alcohol use: No    Alcohol/week: 0.0 oz  . Drug use: No     Allergies   Benazepril; Codeine; Morphine; and Ondansetron hcl   Review of Systems Review of Systems  Constitutional: Negative for fever.  Gastrointestinal: Negative for abdominal pain, nausea and vomiting.  Genitourinary: Positive for dysuria, frequency and urgency.  Musculoskeletal: Positive for back pain.     Physical Exam Updated Vital Signs BP  137/90 (BP Location: Left Arm)   Pulse 90   Temp 98.2 F (36.8 C) (Oral)   Resp 18   Ht 5\' 6"  (1.676 m)   Wt 80.7 kg (178 lb)   SpO2 99%   BMI 28.73 kg/m   Physical Exam  Constitutional: She appears well-developed and well-nourished. No distress.  HENT:  Head: Normocephalic and atraumatic.  Mouth/Throat: Oropharynx is clear and moist. No oropharyngeal exudate.  Eyes: Conjunctivae are normal. Pupils are equal, round, and reactive to light. Right eye exhibits no discharge. Left eye exhibits no discharge. No scleral icterus.  Neck: Normal range of motion. Neck supple. No thyromegaly present.  Cardiovascular: Normal rate, regular rhythm, normal heart sounds and intact distal pulses. Exam reveals no gallop and no friction rub.  No murmur heard. Pulmonary/Chest: Effort normal and breath sounds normal. No stridor. No respiratory distress. She has no wheezes. She has no rales.  Abdominal: Soft. Bowel sounds are normal. She exhibits no distension. There is no tenderness. There is no rebound, no guarding and no CVA tenderness.  Musculoskeletal: She exhibits no edema.  Lymphadenopathy:    She has no cervical adenopathy.  Neurological: She is alert. Coordination normal.  Skin: Skin is warm and dry. No rash noted. She is not diaphoretic. No pallor.  Psychiatric: She has a normal mood and affect.  Nursing note and vitals reviewed.    ED Treatments / Results  Labs (all labs ordered are listed, but only abnormal results are displayed) Labs Reviewed  URINALYSIS, ROUTINE W REFLEX MICROSCOPIC - Abnormal; Notable for the following components:      Result Value   APPearance CLOUDY (*)    Hgb urine dipstick TRACE (*)    Leukocytes, UA MODERATE (*)    All other components within normal limits  URINALYSIS, MICROSCOPIC (REFLEX) - Abnormal; Notable for the following components:   Bacteria, UA MANY (*)    Squamous Epithelial / LPF 6-30 (*)    All other components within normal limits  URINE  CULTURE    EKG  EKG Interpretation None       Radiology No results found.  Procedures Procedures (including critical care time)  Medications Ordered in ED Medications - No data to display   Initial Impression / Assessment and Plan / ED Course  I have reviewed the triage vital signs and the nursing notes.  Pertinent labs & imaging results that were available during my care of the patient were reviewed by me and considered in my medical decision making (see chart for details).     Patient with urinary tract infection on UA.  UA shows moderate leukocytes, trace hematuria, too numerous to count WBCs and many bacteria.  She denies abnormal vaginal discharge.  Urine culture sent.  Will treat with Bactrim, as patient recently had a urinary tract Keflex.  No signs of pyelonephritis at this time.  Vitals are stable.  Patient advised to follow-up with PCP.  Return precautions discussed.  Patient understands and agrees with plan.  Patient vitals stable throughout ED course and discharged in satisfactory condition.  Final Clinical Impressions(s) / ED Diagnoses   Final diagnoses:  Lower urinary tract infectious disease    ED Discharge Orders        Ordered    sulfamethoxazole-trimethoprim (BACTRIM DS,SEPTRA DS) 800-160 MG tablet  2 times daily     12/08/17 [place], Adarsh Mundorf M, PA-C 12/09/17 2338    Fredia Sorrow, MD 12/14/17 Laureen Abrahams

## 2017-12-08 NOTE — ED Triage Notes (Signed)
Pt states "feel like a got a UTI"-NAD-steady gait

## 2017-12-09 ENCOUNTER — Telehealth: Payer: Self-pay | Admitting: Family Medicine

## 2017-12-09 LAB — CHROMOSOME ANALYSIS, BONE MARROW

## 2017-12-09 LAB — TISSUE HYBRIDIZATION (BONE MARROW)-NCBH

## 2017-12-09 NOTE — Telephone Encounter (Signed)
Copied from [place]. Topic: General - Other >> Dec 09, 2017  3:36 PM Yvette Rack wrote: Reason for CRM: Ellen,RN  from [facility] [phone] is calling to let the provider know that they will still see the patient for 3 weeks due to UTI and to finish Power of [place] paper work

## 2017-12-09 NOTE — Telephone Encounter (Signed)
great

## 2017-12-09 NOTE — Telephone Encounter (Signed)
Noted  

## 2017-12-10 LAB — PROTEIN ELECTROPHORESIS, SERUM, WITH REFLEX
A/G Ratio: 0.6 — ABNORMAL LOW (ref 0.7–1.7)
ALPHA-1-GLOBULIN: 0.2 g/dL (ref 0.0–0.4)
ALPHA-2-GLOBULIN: 0.9 g/dL (ref 0.4–1.0)
Albumin ELP: 3.4 g/dL (ref 2.9–4.4)
BETA GLOBULIN: 1.2 g/dL (ref 0.7–1.3)
GLOBULIN, TOTAL: 6.1 g/dL — AB (ref 2.2–3.9)
Gamma Globulin: 3.8 g/dL — ABNORMAL HIGH (ref 0.4–1.8)
M-SPIKE, %: 2.9 g/dL — AB
SPEP Interpretation: 0
Total Protein ELP: 9.5 g/dL — ABNORMAL HIGH (ref 6.0–8.5)

## 2017-12-12 LAB — URINE CULTURE: Culture: >=100000 — AB

## 2017-12-13 ENCOUNTER — Telehealth: Payer: Self-pay | Admitting: Emergency Medicine

## 2017-12-13 NOTE — Progress Notes (Signed)
ED Antimicrobial Stewardship Positive Culture Follow Up   Tami [name] is an 62 y.o. female who presented to [facility] on 12/08/2017 with a chief complaint of  Chief Complaint  Patient presents with  . Dysuria    Recent Results (from the past 720 hour(s))  Urine culture     Status: Abnormal   Collection Time: 12/08/17  4:40 PM  Result Value Ref Range Status   Specimen Description   Final    URINE, CLEAN CATCH Performed at [facility], [address] [place], [place] [address]    Special Requests   Final    NONE Performed at [facility], [address] [place], [place] [address]    Culture >=100,000 COLONIES/mL ENTEROCOCCUS FAECALIS (A)  Final   Report Status 12/12/2017 FINAL  Final   Organism ID, Bacteria ENTEROCOCCUS FAECALIS (A)  Final      Susceptibility   Enterococcus faecalis - MIC*    AMPICILLIN <=2 SENSITIVE Sensitive     LEVOFLOXACIN 1 SENSITIVE Sensitive     NITROFURANTOIN <=16 SENSITIVE Sensitive     VANCOMYCIN 2 SENSITIVE Sensitive     * >=100,000 COLONIES/mL ENTEROCOCCUS FAECALIS    [x]  Treated with bactrim, organism resistant to prescribed antimicrobial []  Patient discharged originally without antimicrobial agent and treatment is now indicated  New antibiotic prescription: amox 500 bid x 1 week  ED Provider: Geanie Kenning, PA-C  Wynell Balloon 12/13/2017, 8:22 AM Infectious Diseases Pharmacist Phone# [phone]

## 2017-12-13 NOTE — Telephone Encounter (Signed)
Post ED Visit - Positive Culture Follow-up: Successful Patient Follow-Up  Culture assessed and recommendations reviewed by: []  Elenor Quinones, Pharm.D. []  Heide Guile, Pharm.D., BCPS AQ-ID [x]  Parks Neptune, Pharm.D., BCPS []  Alycia Rossetti, Pharm.D., BCPS []  [place], Pharm.D., BCPS, AAHIVP []  Legrand Como, Pharm.D., BCPS, AAHIVP []  Salome Arnt, PharmD, BCPS []  Dimitri Ped, PharmD, BCPS []  Vincenza Hews, PharmD, BCPS  Positive urine culture  []  Patient discharged without antimicrobial prescription and treatment is now indicated [x]  Organism is resistant to prescribed ED discharge antimicrobial []  Patient with positive blood cultures  Changes discussed with ED provider: Geanie Kenning PA New antibiotic prescription d/c bactrim, Start amoxicillin 500mg  po bid x 1 week  Attempting to contact patient    Hazle Nordmann 12/13/2017, 12:04 PM

## 2017-12-14 ENCOUNTER — Telehealth: Payer: Self-pay | Admitting: Emergency Medicine

## 2017-12-14 MED FILL — AMOXICILLIN 500 MG CAPSULE: 500 | 7 days supply | Qty: 14 | Fill #0

## 2017-12-20 ENCOUNTER — Other Ambulatory Visit: Payer: Self-pay | Admitting: *Deleted

## 2017-12-20 DIAGNOSIS — D801 Nonfamilial hypogammaglobulinemia: Secondary | ICD-10-CM

## 2017-12-21 ENCOUNTER — Inpatient Hospital Stay: Payer: Self-pay | Attending: Hematology & Oncology

## 2017-12-21 ENCOUNTER — Inpatient Hospital Stay: Payer: Self-pay

## 2017-12-21 VITALS — BP 123/85 | HR 87 | Temp 98.0°F | Resp 18

## 2017-12-21 DIAGNOSIS — D472 Monoclonal gammopathy: Secondary | ICD-10-CM | POA: Insufficient documentation

## 2017-12-21 DIAGNOSIS — D801 Nonfamilial hypogammaglobulinemia: Secondary | ICD-10-CM

## 2017-12-21 DIAGNOSIS — J189 Pneumonia, unspecified organism: Secondary | ICD-10-CM

## 2017-12-21 LAB — CMP (CANCER CENTER ONLY)
ALT: 17 U/L (ref 0–55)
ANION GAP: 5 (ref 5–15)
AST: 30 U/L (ref 5–34)
Albumin: 3 g/dL — ABNORMAL LOW (ref 3.5–5.0)
Alkaline Phosphatase: 57 U/L (ref 26–84)
BILIRUBIN TOTAL: 0.5 mg/dL (ref 0.2–1.2)
BUN: 22 mg/dL (ref 7–22)
CALCIUM: 9.3 mg/dL (ref 8.0–10.3)
CHLORIDE: 112 mmol/L — AB (ref 98–108)
CO2: 25 mmol/L (ref 18–33)
Creatinine: 0.4 mg/dL — ABNORMAL LOW (ref 0.60–1.10)
Glucose, Bld: 100 mg/dL (ref 73–118)
Potassium: 3.5 mmol/L (ref 3.3–4.7)
Sodium: 142 mmol/L (ref 128–145)
Total Protein: 10.6 g/dL — ABNORMAL HIGH (ref 6.4–8.1)

## 2017-12-21 LAB — CBC WITH DIFFERENTIAL (CANCER CENTER ONLY)
BASOS PCT: 1 %
Basophils Absolute: 0 10*3/uL (ref 0.0–0.1)
Eosinophils Absolute: 0.1 10*3/uL (ref 0.0–0.5)
Eosinophils Relative: 2 %
HCT: 33.7 % — ABNORMAL LOW (ref 34.8–46.6)
HEMOGLOBIN: 10.9 g/dL — AB (ref 11.6–15.9)
Lymphocytes Relative: 31 %
Lymphs Abs: 1 10*3/uL (ref 0.9–3.3)
MCH: 28.6 pg (ref 26.0–34.0)
MCHC: 32.3 g/dL (ref 32.0–36.0)
MCV: 88.5 fL (ref 81.0–101.0)
MONOS PCT: 7 %
Monocytes Absolute: 0.2 10*3/uL (ref 0.1–0.9)
NEUTROS ABS: 1.9 10*3/uL (ref 1.5–6.5)
NEUTROS PCT: 59 %
Platelet Count: 137 10*3/uL — ABNORMAL LOW (ref 145–400)
RBC: 3.81 MIL/uL (ref 3.70–5.32)
RDW: 14.8 % (ref 11.1–15.7)
WBC Count: 3.2 10*3/uL — ABNORMAL LOW (ref 3.9–10.0)

## 2017-12-21 MED ORDER — SODIUM CHLORIDE 0.9 % IV SOLN
Freq: Once | INTRAVENOUS | Status: AC
  Administered 2017-12-21: 09:00:00 via INTRAVENOUS

## 2017-12-21 MED ORDER — ACETAMINOPHEN 325 MG PO TABS
ORAL_TABLET | ORAL | Status: AC
  Filled 2017-12-21: qty 2

## 2017-12-21 MED ORDER — IMMUNE GLOBULIN (HUMAN) 20 GM/200ML IJ SOLN
40.0000 g | Freq: Once | INTRAMUSCULAR | Status: AC
  Administered 2017-12-21: 40 g via INTRAVENOUS
  Filled 2017-12-21: qty 400

## 2017-12-21 MED ORDER — DIPHENHYDRAMINE HCL 25 MG PO CAPS
ORAL_CAPSULE | ORAL | Status: AC
  Filled 2017-12-21: qty 1

## 2017-12-21 MED ORDER — ACETAMINOPHEN 325 MG PO TABS
650.0000 mg | ORAL_TABLET | Freq: Once | ORAL | Status: AC
  Administered 2017-12-21: 650 mg via ORAL

## 2017-12-21 MED ORDER — DIPHENHYDRAMINE HCL 25 MG PO TABS
25.0000 mg | ORAL_TABLET | Freq: Once | ORAL | Status: AC
  Administered 2017-12-21: 25 mg via ORAL
  Filled 2017-12-21: qty 1

## 2017-12-21 NOTE — Patient Instructions (Signed)

## 2017-12-23 ENCOUNTER — Ambulatory Visit: Payer: Medicaid Other | Admitting: Family Medicine

## 2017-12-24 NOTE — Telephone Encounter (Signed)
  Reason for CRM: Pt contact pt when Dr. Charlett Blake schedule reopens, pt needs a 6 week f/u.      Patient no-showed on 12/23/17 she had an 11:15am w/ PCP

## 2017-12-28 ENCOUNTER — Encounter: Payer: Self-pay | Admitting: Family Medicine

## 2018-01-25 ENCOUNTER — Inpatient Hospital Stay: Payer: Medicaid Other

## 2018-01-25 ENCOUNTER — Other Ambulatory Visit: Payer: Self-pay

## 2018-01-25 ENCOUNTER — Inpatient Hospital Stay: Payer: Medicaid Other | Attending: Hematology & Oncology | Admitting: Hematology & Oncology

## 2018-01-25 VITALS — BP 138/80 | HR 80 | Temp 97.8°F | Resp 18 | Wt 176.5 lb

## 2018-01-25 DIAGNOSIS — D801 Nonfamilial hypogammaglobulinemia: Secondary | ICD-10-CM

## 2018-01-25 DIAGNOSIS — D72819 Decreased white blood cell count, unspecified: Secondary | ICD-10-CM

## 2018-01-25 DIAGNOSIS — M545 Low back pain: Secondary | ICD-10-CM | POA: Diagnosis not present

## 2018-01-25 DIAGNOSIS — D472 Monoclonal gammopathy: Secondary | ICD-10-CM | POA: Insufficient documentation

## 2018-01-25 LAB — CMP (CANCER CENTER ONLY)
ALBUMIN: 3.2 g/dL — AB (ref 3.5–5.0)
ALK PHOS: 44 U/L (ref 40–150)
ALT: 16 U/L (ref 0–55)
ANION GAP: 6 (ref 3–11)
AST: 27 U/L (ref 5–34)
BILIRUBIN TOTAL: 0.3 mg/dL (ref 0.2–1.2)
BUN: 18 mg/dL (ref 7–26)
CALCIUM: 9.2 mg/dL (ref 8.4–10.4)
CO2: 24 mmol/L (ref 22–29)
Chloride: 109 mmol/L (ref 98–109)
Creatinine: 0.83 mg/dL (ref 0.60–1.10)
GLUCOSE: 88 mg/dL (ref 70–140)
POTASSIUM: 4.1 mmol/L (ref 3.5–5.1)
Sodium: 139 mmol/L (ref 136–145)
Total Protein: 10.1 g/dL — ABNORMAL HIGH (ref 6.4–8.3)

## 2018-01-25 LAB — CBC WITH DIFFERENTIAL (CANCER CENTER ONLY)
BASOS ABS: 0 10*3/uL (ref 0.0–0.1)
BASOS PCT: 0 %
Eosinophils Absolute: 0.1 10*3/uL (ref 0.0–0.5)
Eosinophils Relative: 2 %
HCT: 33.3 % — ABNORMAL LOW (ref 34.8–46.6)
HEMOGLOBIN: 10.6 g/dL — AB (ref 11.6–15.9)
Lymphocytes Relative: 31 %
Lymphs Abs: 0.9 10*3/uL (ref 0.9–3.3)
MCH: 28.4 pg (ref 26.0–34.0)
MCHC: 31.8 g/dL — ABNORMAL LOW (ref 32.0–36.0)
MCV: 89.3 fL (ref 81.0–101.0)
Monocytes Absolute: 0.2 10*3/uL (ref 0.1–0.9)
Monocytes Relative: 7 %
NEUTROS ABS: 1.7 10*3/uL (ref 1.5–6.5)
NEUTROS PCT: 60 %
Platelet Count: 137 10*3/uL — ABNORMAL LOW (ref 145–400)
RBC: 3.73 MIL/uL (ref 3.70–5.32)
RDW: 15.1 % (ref 11.1–15.7)
WBC: 2.8 10*3/uL — AB (ref 3.9–10.0)

## 2018-01-25 NOTE — Progress Notes (Signed)
Hematology and Oncology Follow Up Visit  Tami [name] [id number] 26-Feb-1956 62 y.o. 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  She apparently was in a local emergency room last week.  She was told that her white cell count was very low.  As always, this is an exaggeration.  She has mild leukopenia.  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  This is really never cause her a problem.  She is complaining of lower back pain.  She does have some arthritis.  We will go ahead and get her set up with an MRI to make sure that there is no myelomatous involvement back there.  If there is, then this would indicate that she will need to start therapy.  We last saw her in January, her M spike was up to 2.9 g/dL.  Her IgG level was 4700 mg/dL.  Her kappa light chain was 12.1 mg/dL.  We are definitely getting close to the point that we are going to have to initiate therapy on her.  She has had no problems with bowels or bladder.  She is had no fever.  She is had no leg weakness.   Currently, her performance status is ECOG 1.   Medications:  Current Outpatient Medications:  .  acetaminophen (TYLENOL) 325 MG tablet, Take 650 mg by mouth., Disp: , Rfl:  .  albuterol (PROVENTIL HFA;VENTOLIN HFA) 108 (90 Base) MCG/ACT inhaler, Inhale 2 puffs into the lungs every 6 (six) hours as needed for wheezing or shortness of breath., Disp: 1 Inhaler, Rfl: 1 .  amLODipine (NORVASC) 10 MG tablet, Take 1 tablet (10 mg total) by mouth daily., Disp: 90 tablet, Rfl: 1 .  aspirin EC 81 MG tablet, Take 1 tablet (81 mg total) by mouth daily., Disp: 30 tablet, Rfl: 1 .  carvedilol (COREG) 25 MG tablet, Take 1 tablet (25 mg total) by mouth 2 (two) times daily with a meal., Disp: 180 tablet, Rfl: 1 .  Cholecalciferol (VITAMIN D3) 5000 UNITS CAPS, Take 1  capsule by mouth daily., Disp: , Rfl:  .  conjugated estrogens (PREMARIN) vaginal cream, Place vaginally., Disp: , Rfl:  .  docusate sodium (COLACE) 50 MG capsule, Take 50 mg by mouth 2 (two) times daily., Disp: , Rfl:  .  ergocalciferol (VITAMIN D2) 50000 units capsule, Take 1 capsule (50,000 Units total) by mouth once a week., Disp: 12 capsule, Rfl: 1 .  escitalopram (LEXAPRO) 10 MG tablet, Take 10 mg by mouth daily., Disp: , Rfl:  .  famotidine (PEPCID) 40 MG tablet, Take 1 tablet (40 mg total) by mouth at bedtime., Disp: 90 tablet, Rfl: 1 .  fluticasone (FLONASE) 50 MCG/ACT nasal spray, Place 2 sprays into both nostrils daily., Disp: 16 g, Rfl: 6 .  furosemide (LASIX) 20 MG tablet, Take 1 tablet (20 mg total) by mouth daily as needed (WT gain>3# in 24 HR)., Disp: 90 tablet, Rfl: 1 .  metoCLOPramide (REGLAN) 10 MG tablet, Take 1 tablet (10 mg total) by mouth 4 (four) times daily -  before meals and at bedtime., Disp: 60 tablet, Rfl: 4 .  modafinil (PROVIGIL) 100 MG tablet, Take 1 tablet (100 mg total) by mouth daily., Disp: 30 tablet, Rfl: 5 .  Multiple Vitamins-Minerals (CENTRUM SILVER ADULT 50+ PO), Take by mouth., Disp: , Rfl:  .  pantoprazole (  PROTONIX) 40 MG tablet, Take 1 tablet (40 mg total) by mouth 2 (two) times daily., Disp: 180 tablet, Rfl: 1 .  potassium chloride SA (KLOR-CON M20) 20 MEQ tablet, Take 1 tablet (20 mEq total) by mouth daily., Disp: 30 tablet, Rfl: 1 .  sucralfate (CARAFATE) 1 g tablet, Take 1 tablet (1 g total) by mouth 4 (four) times daily -  with meals and at bedtime. prn, Disp: 120 tablet, Rfl: 1 .  topiramate (TOPAMAX) 25 MG tablet, Take 1 tablet (25 mg total) by mouth 2 (two) times daily., Disp: 60 tablet, Rfl: 2 .  triamterene-hydrochlorothiazide (MAXZIDE-25) 37.5-25 MG tablet, Take 1 tablet by mouth daily., Disp: 90 tablet, Rfl: 1 .  zolpidem (AMBIEN) 10 MG tablet, Take 10 mg by mouth at bedtime as needed for sleep., Disp: , Rfl:  .  gabapentin (NEURONTIN) 300 MG  capsule, Take 1 capsule (300 mg total) by mouth 3 (three) times daily. (Patient not taking: Reported on 12/07/2017), Disp: 90 capsule, Rfl: 3 .  HYDROcodone-acetaminophen (NORCO) 5-325 MG tablet, Take 1 tablet by mouth 3 (three) times daily as needed for moderate pain. (Patient not taking: Reported on 12/07/2017), Disp: 15 tablet, Rfl: 0 .  silver sulfADIAZINE (SILVADENE) 1 % cream, Apply 1 application topically daily. (Patient not taking: Reported on 12/21/2017), Disp: 50 g, Rfl: 1 .  tiZANidine (ZANAFLEX) 4 MG tablet, Take 0.5-1 tablets (2-4 mg total) by mouth at bedtime as needed for muscle spasms. (Patient not taking: Reported on 12/07/2017), Disp: 30 tablet, Rfl: 1  Allergies:  Allergies  Allergen Reactions  . Benazepril Anaphylaxis    angioedema  . Codeine Nausea And Vomiting  . Morphine     Redness and hives noted post IV admin on 07/05/17  . Ondansetron Hcl     Redness and hives post IV admin on 07/05/17    Past Medical History, Surgical history, Social history, and Family History were reviewed and updated.  Review of Systems: Review of Systems  Constitutional: Negative.   HENT: Negative.   Eyes: Negative.   Respiratory: Negative.   Cardiovascular: Negative.   Gastrointestinal: Negative.   Genitourinary: Negative.   Musculoskeletal: Negative.   Skin: Negative.   Neurological: Negative.   Endo/Heme/Allergies: Negative.   Psychiatric/Behavioral: Negative.      Physical Exam:  weight is 176 lb 8 oz (80.1 kg). Her oral temperature is 97.8 F (36.6 C). Her blood pressure is 138/80 and her pulse is 80. Her respiration is 18 and oxygen saturation is 100%.   Wt Readings from Last 3 Encounters:  01/25/18 176 lb 8 oz (80.1 kg)  12/08/17 178 lb (80.7 kg)  12/07/17 175 lb 12 oz (79.7 kg)     Physical Exam  Constitutional: She is oriented to person, place, and time.  HENT:  Head: Normocephalic and atraumatic.  Mouth/Throat: Oropharynx is clear and moist.  Eyes: EOM are  normal. Pupils are equal, round, and reactive to light.  Neck: Normal range of motion.  Cardiovascular: Normal rate, regular rhythm and normal heart sounds.  Pulmonary/Chest: Effort normal and breath sounds normal.  Abdominal: Soft. Bowel sounds are normal.  Musculoskeletal: Normal range of motion. She exhibits no edema, tenderness or deformity.  Lymphadenopathy:    She has no cervical adenopathy.  Neurological: She is alert and oriented to person, place, and time.  Skin: Skin is warm and dry. No rash noted. No erythema.  Psychiatric: She has a normal mood and affect. Her behavior is normal. Judgment and thought content normal.  Vitals  reviewed.  a.   Lab Results  Component Value Date   WBC 2.8 (L) 01/25/2018   HGB 11.4 (L) 11/17/2017   HCT 33.3 (L) 01/25/2018   MCV 89.3 01/25/2018   PLT 137 (L) 01/25/2018     Chemistry      Component Value Date/Time   NA 142 12/21/2017 0806   NA 143 11/08/2017 1127   NA 140 12/31/2016 1000   K 3.5 12/21/2017 0806   K 4.0 11/08/2017 1127   K 3.6 12/31/2016 1000   CL 112 (H) 12/21/2017 0806   CL 107 11/08/2017 1127   CO2 25 12/21/2017 0806   CO2 25 11/08/2017 1127   CO2 24 12/31/2016 1000   BUN 22 12/21/2017 0806   BUN 19 11/08/2017 1127   BUN 22.3 12/31/2016 1000   CREATININE 0.40 (L) 12/21/2017 0806   CREATININE 0.9 11/08/2017 1127   CREATININE 0.8 12/31/2016 1000      Component Value Date/Time   CALCIUM 9.3 12/21/2017 0806   CALCIUM 9.9 11/08/2017 1127   CALCIUM 9.5 12/31/2016 1000   ALKPHOS 57 12/21/2017 0806   ALKPHOS 44 11/08/2017 1127   ALKPHOS 51 12/31/2016 1000   AST 30 12/21/2017 0806   AST 28 12/31/2016 1000   ALT 17 12/21/2017 0806   ALT 36 11/08/2017 1127   ALT 16 12/31/2016 1000   BILITOT 0.5 12/21/2017 0806   BILITOT 0.46 12/31/2016 1000         Impression and Plan: Tami [name] is a 62 year old African-American female.  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  We will see what the MRI shows.  If  there is suggestion of myeloma in her back, then we will start therapy on her.  I would like to see her back in about 4 weeks.  We really have to keep close tabs on her.  [name], MD 3/19/20199:30 AM

## 2018-01-26 ENCOUNTER — Other Ambulatory Visit: Payer: Self-pay | Admitting: Family

## 2018-01-26 DIAGNOSIS — D472 Monoclonal gammopathy: Secondary | ICD-10-CM

## 2018-01-26 LAB — IGG, IGA, IGM
IGM (IMMUNOGLOBULIN M), SRM: 160 mg/dL (ref 26–217)
IgA: 303 mg/dL (ref 87–352)
IgG (Immunoglobin G), Serum: 4410 mg/dL — ABNORMAL HIGH (ref 700–1600)

## 2018-01-26 LAB — KAPPA/LAMBDA LIGHT CHAINS
KAPPA, LAMDA LIGHT CHAIN RATIO: 11.41 — AB (ref 0.26–1.65)
Kappa free light chain: 134.6 mg/L — ABNORMAL HIGH (ref 3.3–19.4)
LAMDA FREE LIGHT CHAINS: 11.8 mg/L (ref 5.7–26.3)

## 2018-01-26 LAB — BETA 2 MICROGLOBULIN, SERUM: BETA 2 MICROGLOBULIN: 1.7 mg/L (ref 0.6–2.4)

## 2018-01-27 ENCOUNTER — Other Ambulatory Visit: Payer: Self-pay | Admitting: Family

## 2018-01-27 ENCOUNTER — Telehealth: Payer: Self-pay | Admitting: *Deleted

## 2018-01-27 DIAGNOSIS — M545 Low back pain: Secondary | ICD-10-CM

## 2018-01-27 NOTE — Telephone Encounter (Addendum)
Patient is aware of results  ----- Message from [name], MD sent at 01/26/2018  1:03 PM EDT ----- Call - myeloma level is holding stable!!  Laurey Arrow

## 2018-01-28 LAB — PROTEIN ELECTROPHORESIS, SERUM, WITH REFLEX
A/G Ratio: 0.5 — ABNORMAL LOW (ref 0.7–1.7)
ALPHA-1-GLOBULIN: 0.3 g/dL (ref 0.0–0.4)
ALPHA-2-GLOBULIN: 1 g/dL (ref 0.4–1.0)
Albumin ELP: 3.3 g/dL (ref 2.9–4.4)
Beta Globulin: 1.2 g/dL (ref 0.7–1.3)
GAMMA GLOBULIN: 3.6 g/dL — AB (ref 0.4–1.8)
Globulin, Total: 6.1 g/dL — ABNORMAL HIGH (ref 2.2–3.9)
M-SPIKE, %: 2.7 g/dL — AB
SPEP Interpretation: 0
TOTAL PROTEIN ELP: 9.4 g/dL — AB (ref 6.0–8.5)

## 2018-02-01 ENCOUNTER — Other Ambulatory Visit: Payer: Self-pay | Admitting: Family

## 2018-02-01 ENCOUNTER — Encounter: Payer: Medicaid Other | Admitting: Family Medicine

## 2018-02-01 DIAGNOSIS — M545 Low back pain: Secondary | ICD-10-CM

## 2018-02-05 ENCOUNTER — Ambulatory Visit (HOSPITAL_BASED_OUTPATIENT_CLINIC_OR_DEPARTMENT_OTHER)
Admission: RE | Admit: 2018-02-05 | Discharge: 2018-02-05 | Disposition: A | Discharge status: Home / Self Care | Payer: Medicaid Other | Source: Ambulatory Visit | Attending: Family | Admitting: Family

## 2018-02-05 DIAGNOSIS — M48061 Spinal stenosis, lumbar region without neurogenic claudication: Secondary | ICD-10-CM | POA: Diagnosis not present

## 2018-02-05 DIAGNOSIS — M545 Low back pain: Secondary | ICD-10-CM | POA: Diagnosis not present

## 2018-02-05 DIAGNOSIS — M5136 Other intervertebral disc degeneration, lumbar region: Secondary | ICD-10-CM | POA: Diagnosis not present

## 2018-02-05 DIAGNOSIS — M4316 Spondylolisthesis, lumbar region: Secondary | ICD-10-CM | POA: Insufficient documentation

## 2018-02-11 ENCOUNTER — Ambulatory Visit: Payer: Self-pay | Admitting: Family Medicine

## 2018-02-14 ENCOUNTER — Telehealth: Payer: Self-pay | Admitting: *Deleted

## 2018-02-14 NOTE — Telephone Encounter (Addendum)
-----   Message from [name], MD sent at 02/14/2018  1:05 PM EDT Called patient to let her know there is NO myeloma in the lower back.  There is arthritis and some spinal stenosis.  You need to see a back doctor. Patient is requesting a doctor in [place].  Dr. Marin Olp does not know any back doctors in [place].  He suggested asking her primary care doctor to make referral.

## 2018-02-15 ENCOUNTER — Ambulatory Visit (INDEPENDENT_AMBULATORY_CARE_PROVIDER_SITE_OTHER): Payer: Medicaid Other | Admitting: Family Medicine

## 2018-02-15 VITALS — BP 112/82 | HR 76 | Temp 98.6°F | Resp 18 | Wt 179.6 lb

## 2018-02-15 DIAGNOSIS — R05 Cough: Secondary | ICD-10-CM | POA: Diagnosis not present

## 2018-02-15 DIAGNOSIS — K2100 Gastro-esophageal reflux disease with esophagitis, without bleeding: Secondary | ICD-10-CM

## 2018-02-15 DIAGNOSIS — R778 Other specified abnormalities of plasma proteins: Secondary | ICD-10-CM

## 2018-02-15 DIAGNOSIS — J40 Bronchitis, not specified as acute or chronic: Secondary | ICD-10-CM | POA: Diagnosis not present

## 2018-02-15 DIAGNOSIS — E785 Hyperlipidemia, unspecified: Secondary | ICD-10-CM | POA: Diagnosis not present

## 2018-02-15 DIAGNOSIS — Z79899 Other long term (current) drug therapy: Secondary | ICD-10-CM

## 2018-02-15 DIAGNOSIS — M546 Pain in thoracic spine: Secondary | ICD-10-CM | POA: Diagnosis not present

## 2018-02-15 DIAGNOSIS — G8929 Other chronic pain: Secondary | ICD-10-CM

## 2018-02-15 DIAGNOSIS — I10 Essential (primary) hypertension: Secondary | ICD-10-CM | POA: Diagnosis not present

## 2018-02-15 DIAGNOSIS — M5441 Lumbago with sciatica, right side: Secondary | ICD-10-CM

## 2018-02-15 DIAGNOSIS — R059 Cough, unspecified: Secondary | ICD-10-CM

## 2018-02-15 LAB — COMPREHENSIVE METABOLIC PANEL
ALBUMIN: 3.5 g/dL (ref 3.5–5.2)
ALT: 15 U/L (ref 0–35)
AST: 22 U/L (ref 0–37)
Alkaline Phosphatase: 39 U/L (ref 39–117)
BUN: 18 mg/dL (ref 6–23)
CHLORIDE: 106 meq/L (ref 96–112)
CO2: 26 meq/L (ref 19–32)
CREATININE: 0.64 mg/dL (ref 0.40–1.20)
Calcium: 8.9 mg/dL (ref 8.4–10.5)
GFR: 120.26 mL/min (ref >60.00)
Glucose, Bld: 86 mg/dL (ref 70–99)
POTASSIUM: 3.5 meq/L (ref 3.5–5.1)
SODIUM: 137 meq/L (ref 135–145)
Total Bilirubin: 0.3 mg/dL (ref 0.2–1.2)
Total Protein: 10 g/dL — ABNORMAL HIGH (ref 6.0–8.3)

## 2018-02-15 LAB — LIPID PANEL
CHOL/HDL RATIO: 3
CHOLESTEROL: 137 mg/dL (ref 0–200)
HDL: 45.9 mg/dL (ref >39.00)
LDL CALC: 72 mg/dL (ref 0–99)
NonHDL: 91.5
Triglycerides: 100 mg/dL (ref 0.0–149.0)
VLDL: 20 mg/dL (ref 0.0–40.0)

## 2018-02-15 LAB — SEDIMENTATION RATE: SED RATE: 116 mm/h — AB (ref 0–30)

## 2018-02-15 LAB — TSH: TSH: 1.79 u[IU]/mL (ref 0.35–4.50)

## 2018-02-15 MED ORDER — FUROSEMIDE 20 MG PO TABS: 20.0000 mg | ORAL_TABLET | Freq: Every day | ORAL | 1 refills | Status: DC | PRN

## 2018-02-15 MED ORDER — HYDROCODONE-HOMATROPINE 5-1.5 MG/5ML PO SYRP: 5.0000 mL | ORAL_SOLUTION | Freq: Four times a day (QID) | ORAL | 0 refills | Status: DC | PRN

## 2018-02-15 MED ORDER — ASPIRIN EC 81 MG PO TBEC: 81.0000 mg | DELAYED_RELEASE_TABLET | Freq: Every day | ORAL | 1 refills | Status: DC

## 2018-02-15 MED ORDER — AMLODIPINE BESYLATE 10 MG PO TABS: 10.0000 mg | ORAL_TABLET | Freq: Every day | ORAL | 1 refills | Status: DC

## 2018-02-15 MED ORDER — POTASSIUM CHLORIDE CRYS ER 20 MEQ PO TBCR: 20.0000 meq | EXTENDED_RELEASE_TABLET | Freq: Every day | ORAL | 1 refills | Status: DC

## 2018-02-15 MED ORDER — CARVEDILOL 25 MG PO TABS: 25.0000 mg | ORAL_TABLET | Freq: Two times a day (BID) | ORAL | 1 refills | Status: DC

## 2018-02-15 MED ORDER — TOPIRAMATE 25 MG PO TABS: 25.0000 mg | ORAL_TABLET | Freq: Two times a day (BID) | ORAL | 1 refills | Status: DC

## 2018-02-15 MED ORDER — MODAFINIL 100 MG PO TABS: 100.0000 mg | ORAL_TABLET | Freq: Every day | ORAL | 5 refills | Status: DC

## 2018-02-15 MED ORDER — TRIAMTERENE-HCTZ 37.5-25 MG PO TABS: 1.0000 | ORAL_TABLET | Freq: Every day | ORAL | 1 refills | Status: DC

## 2018-02-15 MED ORDER — ALBUTEROL SULFATE HFA 108 (90 BASE) MCG/ACT IN AERS: 2.0000 | INHALATION_SPRAY | Freq: Four times a day (QID) | RESPIRATORY_TRACT | 1 refills | Status: DC | PRN

## 2018-02-15 MED ORDER — PANTOPRAZOLE SODIUM 40 MG PO TBEC: 40.0000 mg | DELAYED_RELEASE_TABLET | Freq: Two times a day (BID) | ORAL | 1 refills | Status: DC

## 2018-02-15 MED ORDER — FLUTICASONE PROPIONATE 50 MCG/ACT NA SUSP: 2.0000 | Freq: Every day | NASAL | 6 refills | Status: DC

## 2018-02-15 MED ORDER — SUCRALFATE 1 G PO TABS: 1.0000 g | ORAL_TABLET | Freq: Three times a day (TID) | ORAL | 1 refills | Status: DC

## 2018-02-15 MED ORDER — CEFDINIR 300 MG PO CAPS: 300.0000 mg | ORAL_CAPSULE | Freq: Two times a day (BID) | ORAL | 0 refills | Status: AC

## 2018-02-15 NOTE — Assessment & Plan Note (Signed)
Had a recent Bone Marrow bx with oncology that was reassuring

## 2018-02-15 NOTE — Assessment & Plan Note (Signed)
Well controlled, no changes to meds. Encouraged heart healthy diet such as the DASH diet and exercise as tolerated.  °

## 2018-02-15 NOTE — Progress Notes (Signed)
Subjective:  I acted as a Education administrator for Dr. Charlett Blake. [place], [place]  [name], female    DOB: 1956-06-13, 62 y.o.   MRN: [id number]  No chief complaint on file.   HPI  Patient is in today for a follow up visit. Was seen at urgent care and placed on Mucinex, Zpak and not responding continues to feel poorly with fevers, cough, fatigue, malaise.  She has been taking the Z-Pak is prescribed and continues to feel poorly.  No chest pain or palpitations but she has noted significant congestion and epistaxis as well as nasal congestion, facial pressure, headache and malaise.  She notes her abdominal pain continues to occur at times but is improved.  Her bowels are moving normally.  She has had a recent bone marrow biopsy which was reassuring.  No other recent illness or hospitalizations. Denies CP/palp/GI or GU c/o. Taking meds as prescribed  Patient Care Team: Mosie Lukes, MD as PCP - General (Family Medicine)   Past Medical History:  Diagnosis Date  . Abnormal SPEP 07/26/2016  . Arthritis    knees, hands  . Back pain 07/14/2016  . Bowel obstruction ([place]) 11/2014  . C. difficile diarrhea   . CHF (congestive heart failure) ([place])   . Congestive heart failure ([place]) 02/24/2015   S/p pacemaker  . Depression   . Elevated sed rate 08/15/2017  . Epigastric pain 03/22/2017  . GERD (gastroesophageal reflux disease)   . Hyperlipidemia   . Hypertension   . 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 Low back pain 07/14/2016  . 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 Myalgia 12/31/2016  . Obstructive sleep apnea 03/31/2015  . SOB (shortness of breath) 04/09/2016  . Stroke [facility])    TIAs    Past Surgical History:  Procedure Laterality Date  . ABDOMINAL HYSTERECTOMY     menorraghia, 2006, total  . CHOLECYSTECTOMY    . KNEE SURGERY     right, repair torn torn cartialage  . PACEMAKER INSERTION  08/2014  . TONSILLECTOMY    . TUBAL LIGATION      Family History    Problem Relation Age of Onset  . Diabetes Mother   . Hypertension Mother   . Heart disease Mother        s/p 1 stent  . Hyperlipidemia Mother   . Arthritis Mother   . Cancer Father        COLON  . Colon cancer Father 68  . Irritable bowel syndrome Sister   . Hyperlipidemia Daughter   . Hypertension Daughter   . Hypertension Maternal Grandmother   . Arthritis Maternal Grandmother   . Heart disease Maternal Grandfather        MI  . Hypertension Maternal Grandfather   . Arthritis Maternal Grandfather   . Hypertension Son     Social History   Socioeconomic History  . Marital status: Single    Spouse name: [name]  . Number of children: [name]  . Years of education: [name]  . Highest education level: [name]  Occupational History  . [name]  Social Needs  . Financial resource strain: [name]  . Food insecurity:    Worry: [name]    Inability: [name]  . Transportation needs:    Medical: [name]    Non-medical: [name]  Tobacco Use  . Smoking status: Never Smoker  . Smokeless tobacco: Never  Used  Substance and Sexual Activity  . Alcohol use: No    Alcohol/week: 0.0 oz  . Drug use: No  . Sexual activity: [name]  Lifestyle  . Physical activity:    Days per week: [name]    Minutes per session: [name]  . Stress: [name]  Relationships  . Social connections:    Talks on phone: [name]    Gets together: [name]    Attends religious service: [name]    Active member of club or organization: [name]    Attends meetings of clubs or organizations: [name]    Relationship status: [name]  . Intimate partner violence:    Fear of current or ex partner: [name]    Emotionally abused: [name]    Physically abused: [name]    Forced sexual activity: [name]  Other Topics Concern  . [name]  Social History Narrative  . [name]    Outpatient Medications Prior  to Visit  Medication Sig Dispense Refill  . acetaminophen (TYLENOL) 325 MG tablet Take 650 mg by mouth.    . Cholecalciferol (VITAMIN D3) 5000 UNITS CAPS Take 1 capsule by mouth daily.    Marland Kitchen conjugated estrogens (PREMARIN) vaginal cream Place vaginally.    . docusate sodium (COLACE) 50 MG capsule Take 50 mg by mouth 2 (two) times daily.    . ergocalciferol (VITAMIN D2) 50000 units capsule Take 1 capsule (50,000 Units total) by mouth once a week. 12 capsule 1  . escitalopram (LEXAPRO) 10 MG tablet Take 10 mg by mouth daily.    . famotidine (PEPCID) 40 MG tablet Take 1 tablet (40 mg total) by mouth at bedtime. 90 tablet 1  . metoCLOPramide (REGLAN) 10 MG tablet Take 1 tablet (10 mg total) by mouth 4 (four) times daily -  before meals and at bedtime. 60 tablet 4  . Multiple Vitamins-Minerals (CENTRUM SILVER ADULT 50+ PO) Take by mouth.    . zolpidem (AMBIEN) 10 MG tablet Take 10 mg by mouth at bedtime as needed for sleep.    Marland Kitchen albuterol (PROVENTIL HFA;VENTOLIN HFA) 108 (90 Base) MCG/ACT inhaler Inhale 2 puffs into the lungs every 6 (six) hours as needed for wheezing or shortness of breath. 1 Inhaler 1  . amLODipine (NORVASC) 10 MG tablet Take 1 tablet (10 mg total) by mouth daily. 90 tablet 1  . aspirin EC 81 MG tablet Take 1 tablet (81 mg total) by mouth daily. 30 tablet 1  . carvedilol (COREG) 25 MG tablet Take 1 tablet (25 mg total) by mouth 2 (two) times daily with a meal. 180 tablet 1  . fluticasone (FLONASE) 50 MCG/ACT nasal spray Place 2 sprays into both nostrils daily. 16 g 6  . furosemide (LASIX) 20 MG tablet Take 1 tablet (20 mg total) by mouth daily as needed (WT gain>3# in 24 HR). 90 tablet 1  . gabapentin (NEURONTIN) 300 MG capsule Take 1 capsule (300 mg total) by mouth 3 (three) times daily. (Patient not taking: Reported on 12/07/2017) 90 capsule 3  . HYDROcodone-acetaminophen (NORCO) 5-325 MG tablet Take 1 tablet by mouth 3 (three) times daily as needed for moderate pain. (Patient not  taking: Reported on 12/07/2017) 15 tablet 0  . modafinil (PROVIGIL) 100 MG tablet Take 1 tablet (100 mg total) by mouth daily. 30 tablet 5  . pantoprazole (PROTONIX) 40 MG  tablet Take 1 tablet (40 mg total) by mouth 2 (two) times daily. 180 tablet 1  . potassium chloride SA (KLOR-CON M20) 20 MEQ tablet Take 1 tablet (20 mEq total) by mouth daily. 30 tablet 1  . silver sulfADIAZINE (SILVADENE) 1 % cream Apply 1 application topically daily. (Patient not taking: Reported on 12/21/2017) 50 g 1  . sucralfate (CARAFATE) 1 g tablet Take 1 tablet (1 g total) by mouth 4 (four) times daily -  with meals and at bedtime. prn 120 tablet 1  . tiZANidine (ZANAFLEX) 4 MG tablet Take 0.5-1 tablets (2-4 mg total) by mouth at bedtime as needed for muscle spasms. (Patient not taking: Reported on 12/07/2017) 30 tablet 1  . topiramate (TOPAMAX) 25 MG tablet Take 1 tablet (25 mg total) by mouth 2 (two) times daily. 60 tablet 2  . triamterene-hydrochlorothiazide (MAXZIDE-25) 37.5-25 MG tablet Take 1 tablet by mouth daily. 90 tablet 1   No facility-administered medications prior to visit.     Allergies  Allergen Reactions  . Benazepril Anaphylaxis    angioedema  . Codeine Nausea And Vomiting  . Morphine     Redness and hives noted post IV admin on 07/05/17  . Ondansetron Hcl     Redness and hives post IV admin on 07/05/17    Review of Systems  Constitutional: Positive for malaise/fatigue. Negative for fever.  HENT: Positive for congestion, nosebleeds and sinus pain.   Eyes: Negative for blurred vision.  Respiratory: Positive for cough, sputum production and shortness of breath.   Cardiovascular: Negative for chest pain, palpitations and leg swelling.  Gastrointestinal: Positive for abdominal pain. Negative for blood in stool, constipation, diarrhea and vomiting.  Musculoskeletal: Positive for myalgias. Negative for back pain.  Skin: Negative for rash.  Neurological: Negative for loss of consciousness and  headaches.       Objective:    Physical Exam  Constitutional: She is oriented to person, place, and time. She appears well-developed and well-nourished. No distress.  HENT:  Head: Normocephalic and atraumatic.  Eyes: Conjunctivae are normal.  Neck: Normal range of motion. No thyromegaly present.  Cardiovascular: Normal rate and regular rhythm.  Pulmonary/Chest: Effort normal. She has no wheezes.  Decreased breath sounds b/l bases  Abdominal: Soft. Bowel sounds are normal. There is no tenderness.  Musculoskeletal: Normal range of motion. She exhibits no edema or deformity.  Neurological: She is alert and oriented to person, place, and time.  Skin: Skin is warm and dry. She is not diaphoretic.  Psychiatric: She has a normal mood and affect.    BP 112/82 (BP Location: Left Arm, Patient Position: Sitting, Cuff Size: Normal)   Pulse 76   Temp 98.6 F (37 C) (Oral)   Resp 18   Wt 179 lb 9.6 oz (81.5 kg)   SpO2 97%   BMI 28.99 kg/m  Wt Readings from Last 3 Encounters:  02/15/18 179 lb 9.6 oz (81.5 kg)  01/25/18 176 lb 8 oz (80.1 kg)  12/08/17 178 lb (80.7 kg)   BP Readings from Last 3 Encounters:  02/15/18 112/82  01/25/18 138/80  12/21/17 123/85     Immunization History  Administered Date(s) Administered  . Influenza Whole 09/09/2009, 09/12/2013, 07/12/2015  . Influenza,inj,Quad PF,6+ Mos 07/14/2016, 07/27/2017  . Influenza-Unspecified 08/23/2014, 07/06/2015  . Pneumococcal Conjugate-13 07/12/2015  . Pneumococcal Polysaccharide-23 09/06/2014, 07/12/2015  . Pneumococcal-Unspecified 07/06/2015  . Tdap 12/01/2010, 10/11/2015  . Zoster 10/11/2015, 09/15/2016    Health Maintenance  Topic Date Due  . MAMMOGRAM  12/23/2017  . INFLUENZA VACCINE  06/09/2018  . PAP SMEAR  10/10/2018  . COLONOSCOPY  10/26/2019  . TETANUS/TDAP  10/10/2025  . Hepatitis C Screening  Completed  . HIV Screening  Completed    Lab Results  Component Value Date   WBC 2.8 (L) 01/25/2018   HGB  11.4 (L) 11/17/2017   HCT 33.3 (L) 01/25/2018   PLT 137 (L) 01/25/2018   GLUCOSE 88 01/25/2018   CHOL 163 12/31/2016   TRIG 65.0 12/31/2016   HDL 48.00 12/31/2016   LDLCALC 102 (H) 12/31/2016   ALT 16 01/25/2018   AST 27 01/25/2018   NA 139 01/25/2018   K 4.1 01/25/2018   CL 109 01/25/2018   CREATININE 0.83 01/25/2018   BUN 18 01/25/2018   CO2 24 01/25/2018   TSH 1.34 12/31/2016   INR 0.99 11/17/2017    Lab Results  Component Value Date   TSH 1.34 12/31/2016   Lab Results  Component Value Date   WBC 2.8 (L) 01/25/2018   HGB 11.4 (L) 11/17/2017   HCT 33.3 (L) 01/25/2018   MCV 89.3 01/25/2018   PLT 137 (L) 01/25/2018   Lab Results  Component Value Date   NA 139 01/25/2018   K 4.1 01/25/2018   CHLORIDE 108 12/31/2016   CO2 24 01/25/2018   GLUCOSE 88 01/25/2018   BUN 18 01/25/2018   CREATININE 0.83 01/25/2018   BILITOT 0.3 01/25/2018   ALKPHOS 44 01/25/2018   AST 27 01/25/2018   ALT 16 01/25/2018   PROT 10.1 (H) 01/25/2018   ALBUMIN 3.2 (L) 01/25/2018   CALCIUM 9.2 01/25/2018   ANIONGAP 6 01/25/2018   EGFR >90 12/31/2016   GFR 101.81 10/25/2017   Lab Results  Component Value Date   CHOL 163 12/31/2016   Lab Results  Component Value Date   HDL 48.00 12/31/2016   Lab Results  Component Value Date   LDLCALC 102 (H) 12/31/2016   Lab Results  Component Value Date   TRIG 65.0 12/31/2016   Lab Results  Component Value Date   CHOLHDL 3 12/31/2016   No results found for: HGBA1C       Assessment & Plan:   Problem List Items Addressed This Visit    GERD (gastroesophageal reflux disease)   Relevant Medications   sucralfate (CARAFATE) 1 g tablet   pantoprazole (PROTONIX) 40 MG tablet   Other Relevant Orders   Comprehensive metabolic panel   Hyperlipidemia   Relevant Medications   carvedilol (COREG) 25 MG tablet   amLODipine (NORVASC) 10 MG tablet   triamterene-hydrochlorothiazide (MAXZIDE-25) 37.5-25 MG tablet   furosemide (LASIX) 20 MG tablet    aspirin EC 81 MG tablet   Other Relevant Orders   Lipid panel   TSH   Hypertension    Well controlled, no changes to meds. Encouraged heart healthy diet such as the DASH diet and exercise as tolerated.       Relevant Medications   carvedilol (COREG) 25 MG tablet   amLODipine (NORVASC) 10 MG tablet   triamterene-hydrochlorothiazide (MAXZIDE-25) 37.5-25 MG tablet   furosemide (LASIX) 20 MG tablet   aspirin EC 81 MG tablet   Low back pain    Worsening with anterolisthesis and disc bulging in lumbar spine. Has some radicular symptoms down right leg as well. Have referred to ortho for further consideration given her description of the pain holding steady at 10 of 10 and no relief with current regimen.       Relevant Medications  aspirin EC 81 MG tablet   Abnormal SPEP    Had a recent Bone Marrow bx with oncology that was reassuring      Thoracic back pain    On right side, no injury but this is more recent worse with certain movement. Xray of thoracic spine      Relevant Medications   aspirin EC 81 MG tablet   Other Relevant Orders   Ambulatory referral to Orthopedic Surgery   Sedimentation rate   DG Thoracic Spine 2 View   Bronchitis    Was seen at urgent care and placed on Mucinex, Zpak and not responding continues to feel poorly with fevers, cough, fatigue, malaise. Will try switching to Cefdinir and Hydromet to help her rest. Seek care if worsens.        Other Visit Diagnoses    High risk medication use    -  Primary   Relevant Orders   Pain Mgmt, Profile 8 w/Conf, U   Cough       Relevant Medications   HYDROcodone-homatropine (HYCODAN) 5-1.5 MG/5ML syrup   Other Relevant Orders   Sedimentation rate      I have discontinued Lannette Donath silver sulfADIAZINE, gabapentin, tiZANidine, and HYDROcodone-acetaminophen. I am also having her start on cefdinir and HYDROcodone-homatropine. Additionally, I am having her maintain her docusate sodium, Vitamin D3, Multiple  Vitamins-Minerals (CENTRUM SILVER ADULT 50+ PO), ergocalciferol, conjugated estrogens, acetaminophen, metoCLOPramide, famotidine, escitalopram, zolpidem, carvedilol, topiramate, amLODipine, triamterene-hydrochlorothiazide, furosemide, sucralfate, fluticasone, potassium chloride SA, pantoprazole, aspirin EC, albuterol, and modafinil.  Meds ordered this encounter  Medications  . carvedilol (COREG) 25 MG tablet    Sig: Take 1 tablet (25 mg total) by mouth 2 (two) times daily with a meal.    Dispense:  180 tablet    Refill:  1  . topiramate (TOPAMAX) 25 MG tablet    Sig: Take 1 tablet (25 mg total) by mouth 2 (two) times daily.    Dispense:  180 tablet    Refill:  1  . amLODipine (NORVASC) 10 MG tablet    Sig: Take 1 tablet (10 mg total) by mouth daily.    Dispense:  90 tablet    Refill:  1  . triamterene-hydrochlorothiazide (MAXZIDE-25) 37.5-25 MG tablet    Sig: Take 1 tablet by mouth daily.    Dispense:  90 tablet    Refill:  1  . furosemide (LASIX) 20 MG tablet    Sig: Take 1 tablet (20 mg total) by mouth daily as needed (WT gain>3# in 24 HR).    Dispense:  90 tablet    Refill:  1  . sucralfate (CARAFATE) 1 g tablet    Sig: Take 1 tablet (1 g total) by mouth 4 (four) times daily -  with meals and at bedtime. prn    Dispense:  120 tablet    Refill:  1  . fluticasone (FLONASE) 50 MCG/ACT nasal spray    Sig: Place 2 sprays into both nostrils daily.    Dispense:  16 g    Refill:  6  . potassium chloride SA (KLOR-CON M20) 20 MEQ tablet    Sig: Take 1 tablet (20 mEq total) by mouth daily.    Dispense:  90 tablet    Refill:  1  . pantoprazole (PROTONIX) 40 MG tablet    Sig: Take 1 tablet (40 mg total) by mouth 2 (two) times daily.    Dispense:  180 tablet    Refill:  1  . aspirin EC  81 MG tablet    Sig: Take 1 tablet (81 mg total) by mouth daily.    Dispense:  90 tablet    Refill:  1  . DISCONTD: modafinil (PROVIGIL) 100 MG tablet    Sig: Take 1 tablet (100 mg total) by mouth  daily.    Dispense:  30 tablet    Refill:  5  . albuterol (PROVENTIL HFA;VENTOLIN HFA) 108 (90 Base) MCG/ACT inhaler    Sig: Inhale 2 puffs into the lungs every 6 (six) hours as needed for wheezing or shortness of breath.    Dispense:  1 Inhaler    Refill:  1  . DISCONTD: modafinil (PROVIGIL) 100 MG tablet    Sig: Take 1 tablet (100 mg total) by mouth daily.    Dispense:  30 tablet    Refill:  5  . modafinil (PROVIGIL) 100 MG tablet    Sig: Take 1 tablet (100 mg total) by mouth daily.    Dispense:  30 tablet    Refill:  5  . cefdinir (OMNICEF) 300 MG capsule    Sig: Take 1 capsule (300 mg total) by mouth 2 (two) times daily for 10 days.    Dispense:  20 capsule    Refill:  0  . HYDROcodone-homatropine (HYCODAN) 5-1.5 MG/5ML syrup    Sig: Take 5 mLs by mouth every 6 (six) hours as needed for cough.    Dispense:  140 mL    Refill:  0    CMA served as scribe during this visit. History, Physical and Plan performed by medical provider. Documentation and orders reviewed and attested to.  Penni Homans, MD

## 2018-02-15 NOTE — Assessment & Plan Note (Signed)
Worsening with anterolisthesis and disc bulging in lumbar spine. Has some radicular symptoms down right leg as well. Have referred to ortho for further consideration given her description of the pain holding steady at 10 of 10 and no relief with current regimen.

## 2018-02-15 NOTE — Assessment & Plan Note (Signed)
On right side, no injury but this is more recent worse with certain movement. Xray of thoracic spine

## 2018-02-15 NOTE — Assessment & Plan Note (Signed)
Was seen at urgent care and placed on Mucinex, Zpak and not responding continues to feel poorly with fevers, cough, fatigue, malaise. Will try switching to Cefdinir and Hydromet to help her rest. Seek care if worsens.

## 2018-02-15 NOTE — Patient Instructions (Signed)

## 2018-02-16 LAB — PAIN MGMT, PROFILE 8 W/CONF, U
6 ACETYLMORPHINE: NEGATIVE ng/mL (ref <10)
Alcohol Metabolites: NEGATIVE ng/mL (ref <500)
Amphetamines: NEGATIVE ng/mL (ref <500)
BUPRENORPHINE, URINE: NEGATIVE ng/mL (ref <5)
Benzodiazepines: NEGATIVE ng/mL (ref <100)
CREATININE: 141.7 mg/dL
Cocaine Metabolite: NEGATIVE ng/mL (ref <150)
MARIJUANA METABOLITE: NEGATIVE ng/mL (ref <20)
MDMA: NEGATIVE ng/mL (ref <500)
OPIATES: NEGATIVE ng/mL (ref <100)
Oxidant: NEGATIVE ug/mL (ref <200)
Oxycodone: NEGATIVE ng/mL (ref <100)
PH: 6.51 (ref 4.5–9.0)

## 2018-02-22 ENCOUNTER — Encounter: Payer: Self-pay | Admitting: Hematology & Oncology

## 2018-02-22 ENCOUNTER — Inpatient Hospital Stay: Payer: Medicaid Other | Attending: Hematology & Oncology | Admitting: Hematology & Oncology

## 2018-02-22 ENCOUNTER — Other Ambulatory Visit: Payer: Self-pay

## 2018-02-22 ENCOUNTER — Ambulatory Visit (HOSPITAL_BASED_OUTPATIENT_CLINIC_OR_DEPARTMENT_OTHER)
Admission: RE | Admit: 2018-02-22 | Discharge: 2018-02-22 | Disposition: A | Discharge status: Home / Self Care | Payer: Medicaid Other | Source: Ambulatory Visit | Attending: Family Medicine | Admitting: Family Medicine

## 2018-02-22 ENCOUNTER — Inpatient Hospital Stay: Payer: Medicaid Other

## 2018-02-22 VITALS — BP 143/90 | HR 88 | Temp 98.2°F | Resp 16 | Wt 176.0 lb

## 2018-02-22 DIAGNOSIS — M419 Scoliosis, unspecified: Secondary | ICD-10-CM | POA: Insufficient documentation

## 2018-02-22 DIAGNOSIS — D472 Monoclonal gammopathy: Secondary | ICD-10-CM | POA: Diagnosis not present

## 2018-02-22 DIAGNOSIS — M546 Pain in thoracic spine: Secondary | ICD-10-CM | POA: Insufficient documentation

## 2018-02-22 LAB — CBC WITH DIFFERENTIAL (CANCER CENTER ONLY)
BASOS ABS: 0 10*3/uL (ref 0.0–0.1)
BASOS PCT: 1 %
EOS PCT: 3 %
Eosinophils Absolute: 0.1 10*3/uL (ref 0.0–0.5)
HCT: 35.3 % (ref 34.8–46.6)
Hemoglobin: 11.4 g/dL — ABNORMAL LOW (ref 11.6–15.9)
Lymphocytes Relative: 39 %
Lymphs Abs: 1.1 10*3/uL (ref 0.9–3.3)
MCH: 28.4 pg (ref 26.0–34.0)
MCHC: 32.3 g/dL (ref 32.0–36.0)
MCV: 88 fL (ref 81.0–101.0)
MONO ABS: 0.2 10*3/uL (ref 0.1–0.9)
Monocytes Relative: 6 %
Neutro Abs: 1.4 10*3/uL — ABNORMAL LOW (ref 1.5–6.5)
Neutrophils Relative %: 51 %
PLATELETS: 142 10*3/uL — AB (ref 145–400)
RBC: 4.01 MIL/uL (ref 3.70–5.32)
RDW: 14.9 % (ref 11.1–15.7)
WBC Count: 2.8 10*3/uL — ABNORMAL LOW (ref 3.9–10.0)

## 2018-02-22 LAB — CMP (CANCER CENTER ONLY)
ALBUMIN: 3.5 g/dL (ref 3.5–5.0)
ALT: 26 U/L (ref 10–47)
AST: 30 U/L (ref 11–38)
Alkaline Phosphatase: 51 U/L (ref 26–84)
Anion gap: 5 (ref 5–15)
BUN: 14 mg/dL (ref 7–22)
CALCIUM: 9.4 mg/dL (ref 8.0–10.3)
CO2: 27 mmol/L (ref 18–33)
CREATININE: 0.7 mg/dL (ref 0.60–1.20)
Chloride: 109 mmol/L — ABNORMAL HIGH (ref 98–108)
GLUCOSE: 96 mg/dL (ref 73–118)
Potassium: 3.6 mmol/L (ref 3.3–4.7)
SODIUM: 141 mmol/L (ref 128–145)
Total Bilirubin: 0.6 mg/dL (ref 0.2–1.6)
Total Protein: 11.4 g/dL — ABNORMAL HIGH (ref 6.4–8.1)

## 2018-02-22 LAB — LACTATE DEHYDROGENASE: LDH: 215 U/L (ref 125–245)

## 2018-02-22 NOTE — Progress Notes (Signed)
Hematology and Oncology Follow Up Visit  Tami [name] [id number] 1956-02-19 62 y.o. 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 Sorenson is back for follow-up.  She actually feels quite well.  Her back is still bothering her.  She is going to be seen by orthopedic surgery for this.  Her last myeloma studies actually looks stable.  Her M spike was 2.7 g/dL.  IgG level was her 4410 milligrams per deciliter.  Her Kappa Lightchain was 13.5 mg/dL.  Her appetite is good.  She will be having a nice feast on Easter Sunday.  She has had no fever.  She has had no rashes.  She has had no change in bowel or bladder habits.  She has had no bleeding.  Overall, her performance status is ECOG 1.  Medications:  Current Outpatient Medications:  .  acetaminophen (TYLENOL) 325 MG tablet, Take 650 mg by mouth., Disp: , Rfl:  .  albuterol (PROVENTIL HFA;VENTOLIN HFA) 108 (90 Base) MCG/ACT inhaler, Inhale 2 puffs into the lungs every 6 (six) hours as needed for wheezing or shortness of breath., Disp: 1 Inhaler, Rfl: 1 .  amLODipine (NORVASC) 10 MG tablet, Take 1 tablet (10 mg total) by mouth daily., Disp: 90 tablet, Rfl: 1 .  aspirin EC 81 MG tablet, Take 1 tablet (81 mg total) by mouth daily., Disp: 90 tablet, Rfl: 1 .  carvedilol (COREG) 25 MG tablet, Take 1 tablet (25 mg total) by mouth 2 (two) times daily with a meal., Disp: 180 tablet, Rfl: 1 .  cefdinir (OMNICEF) 300 MG capsule, Take 1 capsule (300 mg total) by mouth 2 (two) times daily for 10 days., Disp: 20 capsule, Rfl: 0 .  Cholecalciferol (VITAMIN D3) 5000 UNITS CAPS, Take 1 capsule by mouth daily., Disp: , Rfl:  .  conjugated estrogens (PREMARIN) vaginal cream, Place vaginally., Disp: , Rfl:  .  docusate sodium (COLACE) 50 MG capsule, Take 50 mg by mouth 2 (two) times daily., Disp: , Rfl:  .  ergocalciferol (VITAMIN D2)  50000 units capsule, Take 1 capsule (50,000 Units total) by mouth once a week., Disp: 12 capsule, Rfl: 1 .  escitalopram (LEXAPRO) 10 MG tablet, Take 10 mg by mouth daily., Disp: , Rfl:  .  famotidine (PEPCID) 40 MG tablet, Take 1 tablet (40 mg total) by mouth at bedtime., Disp: 90 tablet, Rfl: 1 .  fluticasone (FLONASE) 50 MCG/ACT nasal spray, Place 2 sprays into both nostrils daily., Disp: 16 g, Rfl: 6 .  furosemide (LASIX) 20 MG tablet, Take 1 tablet (20 mg total) by mouth daily as needed (WT gain>3# in 24 HR)., Disp: 90 tablet, Rfl: 1 .  HYDROcodone-homatropine (HYCODAN) 5-1.5 MG/5ML syrup, Take 5 mLs by mouth every 6 (six) hours as needed for cough., Disp: 140 mL, Rfl: 0 .  metoCLOPramide (REGLAN) 10 MG tablet, Take 1 tablet (10 mg total) by mouth 4 (four) times daily -  before meals and at bedtime., Disp: 60 tablet, Rfl: 4 .  modafinil (PROVIGIL) 100 MG tablet, Take 1 tablet (100 mg total) by mouth daily., Disp: 30 tablet, Rfl: 5 .  Multiple Vitamins-Minerals (CENTRUM SILVER ADULT 50+ PO), Take by mouth., Disp: , Rfl:  .  pantoprazole (PROTONIX) 40 MG tablet, Take 1 tablet (40 mg total) by mouth 2 (two) times daily., Disp: 180 tablet, Rfl: 1 .  potassium chloride SA (KLOR-CON M20)  20 MEQ tablet, Take 1 tablet (20 mEq total) by mouth daily., Disp: 90 tablet, Rfl: 1 .  sucralfate (CARAFATE) 1 g tablet, Take 1 tablet (1 g total) by mouth 4 (four) times daily -  with meals and at bedtime. prn, Disp: 120 tablet, Rfl: 1 .  topiramate (TOPAMAX) 25 MG tablet, Take 1 tablet (25 mg total) by mouth 2 (two) times daily., Disp: 180 tablet, Rfl: 1 .  triamterene-hydrochlorothiazide (MAXZIDE-25) 37.5-25 MG tablet, Take 1 tablet by mouth daily., Disp: 90 tablet, Rfl: 1 .  zolpidem (AMBIEN) 10 MG tablet, Take 10 mg by mouth at bedtime as needed for sleep., Disp: , Rfl:   Allergies:  Allergies  Allergen Reactions  . Benazepril Anaphylaxis and Swelling    angioedema Throat and lip swelling  . Codeine Nausea  And Vomiting  . Morphine     Redness and hives noted post IV admin on 07/05/17  . Ondansetron Hcl     Redness and hives post IV admin on 07/05/17    Past Medical History, Surgical history, Social history, and Family History were reviewed and updated.  Review of Systems: Review of Systems  Constitutional: Negative.   HENT: Negative.   Eyes: Negative.   Respiratory: Negative.   Cardiovascular: Negative.   Gastrointestinal: Negative.   Genitourinary: Negative.   Musculoskeletal: Negative.   Skin: Negative.   Neurological: Negative.   Endo/Heme/Allergies: Negative.   Psychiatric/Behavioral: Negative.      Physical Exam:  weight is 176 lb (79.8 kg). Her oral temperature is 98.2 F (36.8 C). Her blood pressure is 143/90 (abnormal) and her pulse is 88. Her respiration is 16 and oxygen saturation is 100%.   Wt Readings from Last 3 Encounters:  02/22/18 176 lb (79.8 kg)  02/15/18 179 lb 9.6 oz (81.5 kg)  01/25/18 176 lb 8 oz (80.1 kg)     Physical Exam  Constitutional: She is oriented to person, place, and time.  HENT:  Head: Normocephalic and atraumatic.  Mouth/Throat: Oropharynx is clear and moist.  Eyes: Pupils are equal, round, and reactive to light. EOM are normal.  Neck: Normal range of motion.  Cardiovascular: Normal rate, regular rhythm and normal heart sounds.  Pulmonary/Chest: Effort normal and breath sounds normal.  Abdominal: Soft. Bowel sounds are normal.  Musculoskeletal: Normal range of motion. She exhibits no edema, tenderness or deformity.  Lymphadenopathy:    She has no cervical adenopathy.  Neurological: She is alert and oriented to person, place, and time.  Skin: Skin is warm and dry. No rash noted. No erythema.  Psychiatric: She has a normal mood and affect. Her behavior is normal. Judgment and thought content normal.  Vitals reviewed.  a.   Lab Results  Component Value Date   WBC 2.8 (L) 02/22/2018   HGB 11.4 (L) 11/17/2017   HCT 35.3  02/22/2018   MCV 88.0 02/22/2018   PLT 142 (L) 02/22/2018     Chemistry      Component Value Date/Time   NA 141 02/22/2018 0901   NA 143 11/08/2017 1127   NA 140 12/31/2016 1000   K 3.6 02/22/2018 0901   K 4.0 11/08/2017 1127   K 3.6 12/31/2016 1000   CL 109 (H) 02/22/2018 0901   CL 107 11/08/2017 1127   CO2 27 02/22/2018 0901   CO2 25 11/08/2017 1127   CO2 24 12/31/2016 1000   BUN 14 02/22/2018 0901   BUN 19 11/08/2017 1127   BUN 22.3 12/31/2016 1000   CREATININE 0.70 02/22/2018  0901   CREATININE 0.9 11/08/2017 1127   CREATININE 0.8 12/31/2016 1000      Component Value Date/Time   CALCIUM 9.4 02/22/2018 0901   CALCIUM 9.9 11/08/2017 1127   CALCIUM 9.5 12/31/2016 1000   ALKPHOS 51 02/22/2018 0901   ALKPHOS 44 11/08/2017 1127   ALKPHOS 51 12/31/2016 1000   AST 30 02/22/2018 0901   AST 28 12/31/2016 1000   ALT 26 02/22/2018 0901   ALT 36 11/08/2017 1127   ALT 16 12/31/2016 1000   BILITOT 0.6 02/22/2018 0901   BILITOT 0.46 12/31/2016 1000         Impression and Plan: Ms. Langenfeld is a 62 year old African-American female.  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  I think we can get her back now in 2 months.  I feel good about doing this.  He will be interesting to see what the orthopedic surgeon says.  [name], MD 4/16/20199:51 AM

## 2018-02-23 LAB — KAPPA/LAMBDA LIGHT CHAINS
KAPPA FREE LGHT CHN: 118.3 mg/L — AB (ref 3.3–19.4)
KAPPA, LAMDA LIGHT CHAIN RATIO: 8.89 — AB (ref 0.26–1.65)
Lambda free light chains: 13.3 mg/L (ref 5.7–26.3)

## 2018-02-23 LAB — IGG, IGA, IGM
IGA: 329 mg/dL (ref 87–352)
IGG (IMMUNOGLOBIN G), SERUM: 4523 mg/dL — AB (ref 700–1600)
IGM (IMMUNOGLOBULIN M), SRM: 172 mg/dL (ref 26–217)

## 2018-02-23 LAB — BETA 2 MICROGLOBULIN, SERUM: Beta-2 Microglobulin: 1.8 mg/L (ref 0.6–2.4)

## 2018-02-25 LAB — PROTEIN ELECTROPHORESIS, SERUM, WITH REFLEX
A/G RATIO SPE: 0.5 — AB (ref 0.7–1.7)
ALBUMIN ELP: 3.3 g/dL (ref 2.9–4.4)
Alpha-1-Globulin: 0.3 g/dL (ref 0.0–0.4)
Alpha-2-Globulin: 1 g/dL (ref 0.4–1.0)
BETA GLOBULIN: 1.4 g/dL — AB (ref 0.7–1.3)
GLOBULIN, TOTAL: 6.4 g/dL — AB (ref 2.2–3.9)
Gamma Globulin: 3.8 g/dL — ABNORMAL HIGH (ref 0.4–1.8)
M-Spike, %: 3 g/dL — ABNORMAL HIGH
SPEP INTERP: 0
TOTAL PROTEIN ELP: 9.7 g/dL — AB (ref 6.0–8.5)

## 2018-02-25 LAB — IMMUNOFIXATION REFLEX, SERUM
IGA: 309 mg/dL (ref 87–352)
IGM (IMMUNOGLOBULIN M), SRM: 155 mg/dL (ref 26–217)
IgG (Immunoglobin G), Serum: 4559 mg/dL — ABNORMAL HIGH (ref 700–1600)

## 2018-03-03 ENCOUNTER — Ambulatory Visit (INDEPENDENT_AMBULATORY_CARE_PROVIDER_SITE_OTHER): Payer: Self-pay | Admitting: Surgery

## 2018-03-10 DIAGNOSIS — R51 Headache: Secondary | ICD-10-CM | POA: Diagnosis not present

## 2018-03-14 DIAGNOSIS — I428 Other cardiomyopathies: Secondary | ICD-10-CM | POA: Diagnosis not present

## 2018-03-14 DIAGNOSIS — G4733 Obstructive sleep apnea (adult) (pediatric): Secondary | ICD-10-CM | POA: Diagnosis not present

## 2018-03-14 DIAGNOSIS — I10 Essential (primary) hypertension: Secondary | ICD-10-CM | POA: Diagnosis not present

## 2018-03-14 DIAGNOSIS — I447 Left bundle-branch block, unspecified: Secondary | ICD-10-CM | POA: Diagnosis not present

## 2018-03-14 DIAGNOSIS — Z95810 Presence of automatic (implantable) cardiac defibrillator: Secondary | ICD-10-CM | POA: Diagnosis not present

## 2018-03-18 ENCOUNTER — Encounter (INDEPENDENT_AMBULATORY_CARE_PROVIDER_SITE_OTHER): Payer: Self-pay | Admitting: Orthopaedic Surgery

## 2018-03-18 ENCOUNTER — Ambulatory Visit (INDEPENDENT_AMBULATORY_CARE_PROVIDER_SITE_OTHER): Payer: Medicare Other | Admitting: Orthopaedic Surgery

## 2018-03-18 VITALS — BP 145/84 | HR 94 | Ht 67.0 in | Wt 177.6 lb

## 2018-03-18 DIAGNOSIS — M546 Pain in thoracic spine: Secondary | ICD-10-CM

## 2018-03-18 NOTE — Progress Notes (Signed)
Office Visit Note   Patient: Tami [name]           Date of Birth: Apr 07, 1956           MRN: [id number] Visit Date: 03/18/2018              Requested by: Mosie Lukes, MD [address] [place], [place] [address] PCP: Mosie Lukes, MD   Assessment & Plan: Visit Diagnoses:  1. Pain in thoracic spine           With thoracic spondylosis, right thoracic curve.   Plan: She lives in [facility] prescription given for physical therapy.  She can try some ultrasound stretching exercises heat etc.  Patient is neurologically intact but has spondylosis in the thoracic region with associated curvature.  Patient has a pacemaker cannot have an MRI scan.  She has no evidence of myelopathy.  Hopefully should get improvement with some symptomatic treatment and she can continue Tylenol.  Recheck 1 month.  Follow-Up Instructions: No follow-ups on file.   Orders:  No orders of the defined types were placed in this encounter.  No orders of the defined types were placed in this encounter.     Procedures: No procedures performed   Clinical Data: No additional findings.   Subjective: Chief Complaint  Patient presents with  . Lower Back - Pain  . Middle Back - Pain    HPI 62 year old female with myeloma with complaints of mid thoracic spine pain just off the midline on the right side.  She denies any associated numbness or tingling in her fingers but does have pain that wakes her up at night when she has to shake her hands involving the radial 3 Fingers likely Carpal Tunnel syndrome.  Thoracic pain is been present for several months.  She also has some associated knee pain and sometimes has pain that radiates down to her foot.  Previous lumbar CT scan showed grade 1 anterolisthesis mild to moderate neuroforaminal stenosis greater on the right than left consistent with her leg symptoms.  The scan was done 02/05/2018.  Previous skeletal survey showed multilevel cervical spondylosis  and thoracic spondylosis with right thoracic curve without evidence of compression fractures.  Patient denies associated rash no particular increased with pain or cough no myelopathic symptoms.  Review of Systems positive for monoclonal gammopathy.  GERD, bowel obstruction, hyperlipidemia, depression, hypertension, T-spine pain, previous CVA, history of congestive heart failure, sleep apnea otherwise negative as it pertains HPI.   Objective: Vital Signs: BP (!) 145/84 (BP Location: Right Arm, Patient Position: Sitting, Cuff Size: Normal)   Pulse 94   Ht 5\' 7"  (1.702 m)   Wt 177 lb 9.6 oz (80.6 kg)   BMI 27.82 kg/m   Physical Exam  Constitutional: She is oriented to person, place, and time. She appears well-developed.  HENT:  Head: Normocephalic.  Right Ear: External ear normal.  Left Ear: External ear normal.  Eyes: Pupils are equal, round, and reactive to light.  Neck: No tracheal deviation present. No thyromegaly present.  Cardiovascular: Normal rate.  Pulmonary/Chest: Effort normal.  Abdominal: Soft.  Neurological: She is alert and oriented to person, place, and time.  Skin: Skin is warm and dry.  Psychiatric: She has a normal mood and affect. Her behavior is normal.    Ortho Exam open lower extremity reflexes are 2+ and symmetrical.  No isolated motor weakness biceps triceps supraspinatus.  Some pain with compression carpal canal.  No significant  thenar atrophy right or left.  She has tenderness paraspinal muscles thoracic spine adjacent to the scapula on the right side.  No change with cough.  Some discomfort with rotation she continues to try to move her shoulder to see if this would help.  Lower extremity reflexes are 2+ and symmetrical no sensory changes no rash over exposed skin.  Specialty Comments:  No specialty comments available.  Imaging: No results found.   PMFS History: Patient Active Problem List   Diagnosis Date Noted  . Thoracic back pain 02/15/2018  .  Bronchitis 02/15/2018  . 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 Pneumonia 11/11/2017  . Cystitis 10/25/2017  . C. difficile colitis 09/15/2017  . Hypokalemia 08/20/2017  . Anemia 08/20/2017  . Elevated sed rate 08/15/2017  . 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 Epigastric pain 03/22/2017  . Myalgia 12/31/2016  . 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 SOB (shortness of breath) 07/26/2016  . Abnormal SPEP 07/26/2016  . Low back pain 07/14/2016  . Insomnia 12/29/2015  . Tension headache 10/21/2015  . Muscle spasms of neck 10/18/2015  . Dysuria 10/13/2015  . Sinusitis 07/07/2015  . AP (abdominal pain) 07/07/2015  . Cardiomyopathy ([place]) 04/19/2015  . Chest wall pain 04/19/2015  . Obstructive sleep apnea 03/31/2015  . Congestive heart failure ([place]) 02/24/2015  . Hyperlipidemia   . Hypertension   . Depression   . Stroke ([place])   . GERD (gastroesophageal reflux disease)   . Bowel obstruction ([place]) 11/09/2014   Past Medical History:  Diagnosis Date  . Abnormal SPEP 07/26/2016  . Arthritis    knees, hands  . Back pain 07/14/2016  . Bowel obstruction ([place]) 11/2014  . C. difficile diarrhea   . CHF (congestive heart failure) ([place])   . Congestive heart failure ([place]) 02/24/2015   S/p pacemaker  . Depression   . Elevated sed rate 08/15/2017  . Epigastric pain 03/22/2017  . GERD (gastroesophageal reflux disease)   . Hyperlipidemia   . Hypertension   . 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 Low back pain 07/14/2016  . 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 Myalgia 12/31/2016  . Obstructive sleep apnea 03/31/2015  . SOB (shortness of breath) 04/09/2016  . Stroke [facility])    TIAs    Family History  Problem Relation Age of Onset  . Diabetes Mother   . Hypertension Mother   . Heart disease Mother        s/p 1 stent  . Hyperlipidemia Mother   . Arthritis Mother   . Cancer Father        COLON  . Colon  cancer Father 89  . Irritable bowel syndrome Sister   . Hyperlipidemia Daughter   . Hypertension Daughter   . Hypertension Maternal Grandmother   . Arthritis Maternal Grandmother   . Heart disease Maternal Grandfather        MI  . Hypertension Maternal Grandfather   . Arthritis Maternal Grandfather   . Hypertension Son     Past Surgical History:  Procedure Laterality Date  . ABDOMINAL HYSTERECTOMY     menorraghia, 2006, total  . CHOLECYSTECTOMY    . KNEE SURGERY     right, repair torn torn cartialage  . PACEMAKER INSERTION  08/2014  . TONSILLECTOMY    . TUBAL LIGATION     Social History   Occupational History  . [name]  Tobacco Use  . Smoking status: Never Smoker  . Smokeless  tobacco: Never Used  Substance and Sexual Activity  . Alcohol use: No    Alcohol/week: 0.0 oz  . Drug use: No  . Sexual activity: [name]

## 2018-03-29 ENCOUNTER — Ambulatory Visit (INDEPENDENT_AMBULATORY_CARE_PROVIDER_SITE_OTHER): Payer: Medicare Other | Admitting: Family Medicine

## 2018-03-29 ENCOUNTER — Encounter: Payer: Self-pay | Admitting: Family Medicine

## 2018-03-29 VITALS — BP 132/68 | HR 87 | Temp 97.5°F | Resp 18 | Ht 67.0 in | Wt 178.4 lb

## 2018-03-29 DIAGNOSIS — I10 Essential (primary) hypertension: Secondary | ICD-10-CM

## 2018-03-29 DIAGNOSIS — A0472 Enterocolitis due to Clostridium difficile, not specified as recurrent: Secondary | ICD-10-CM

## 2018-03-29 DIAGNOSIS — M791 Myalgia, unspecified site: Secondary | ICD-10-CM

## 2018-03-29 DIAGNOSIS — R197 Diarrhea, unspecified: Secondary | ICD-10-CM | POA: Diagnosis not present

## 2018-03-29 DIAGNOSIS — D472 Monoclonal gammopathy: Secondary | ICD-10-CM | POA: Diagnosis not present

## 2018-03-29 DIAGNOSIS — K2100 Gastro-esophageal reflux disease with esophagitis, without bleeding: Secondary | ICD-10-CM

## 2018-03-29 NOTE — Progress Notes (Signed)
Subjective:  I acted as a Education administrator for Dr. Charlett Blake. [place], [place]  Patient ID: Reginald Mangels, female    DOB: Oct 22, 1956, 62 y.o.   MRN: [id number]  No chief complaint on file.   HPI  Patient is in today for a 6 week follow up and she is experiencing a recurrence of diarrhea this past week. She has noted some noting some malaise, chills and fatigue. Has increased abdominal pain and roughly 3-6 loose stool daily. Her appetite and energy levels are diminished. Denies CP/palp/SOB/HA/congestion/fevers or GU c/o. Taking meds as prescribed  Patient Care Team: Mosie Lukes, MD as PCP - General (Family Medicine)   Past Medical History:  Diagnosis Date  . Abnormal SPEP 07/26/2016  . Arthritis    knees, hands  . Back pain 07/14/2016  . Bowel obstruction ([place]) 11/2014  . C. difficile diarrhea   . CHF (congestive heart failure) ([place])   . Congestive heart failure ([place]) 02/24/2015   S/p pacemaker  . Depression   . Elevated sed rate 08/15/2017  . Epigastric pain 03/22/2017  . GERD (gastroesophageal reflux disease)   . Hyperlipidemia   . Hypertension   . 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 Low back pain 07/14/2016  . 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 Myalgia 12/31/2016  . Obstructive sleep apnea 03/31/2015  . SOB (shortness of breath) 04/09/2016  . Stroke [facility])    TIAs    Past Surgical History:  Procedure Laterality Date  . ABDOMINAL HYSTERECTOMY     menorraghia, 2006, total  . CHOLECYSTECTOMY    . KNEE SURGERY     right, repair torn torn cartialage  . PACEMAKER INSERTION  08/2014  . TONSILLECTOMY    . TUBAL LIGATION      Family History  Problem Relation Age of Onset  . Diabetes Mother   . Hypertension Mother   . Heart disease Mother        s/p 1 stent  . Hyperlipidemia Mother   . Arthritis Mother   . Cancer Father        COLON  . Colon cancer Father 12  . Irritable bowel syndrome Sister   . Hyperlipidemia Daughter   . Hypertension Daughter     . Hypertension Maternal Grandmother   . Arthritis Maternal Grandmother   . Heart disease Maternal Grandfather        MI  . Hypertension Maternal Grandfather   . Arthritis Maternal Grandfather   . Hypertension Son     Social History   Socioeconomic History  . Marital status: Single    Spouse name: [name]  . Number of children: [name]  . Years of education: [name]  . Highest education level: [name]  Occupational History  . [name]  Social Needs  . Financial resource strain: [name]  . Food insecurity:    Worry: [name]    Inability: [name]  . Transportation needs:    Medical: [name]    Non-medical: [name]  Tobacco Use  . Smoking status: Never Smoker  . Smokeless tobacco: Never Used  Substance and Sexual Activity  . Alcohol use: No    Alcohol/week: 0.0 oz  . Drug use: No  . Sexual activity: [name]  Lifestyle  . Physical activity:    Days per week: [name]    Minutes per session: [name]  . Stress: [name]  Relationships  . Social connections:    Talks on phone: [name]    Gets together: [name]    Attends religious service: [name]    Active member of club or organization: [name]    Attends meetings of clubs or organizations: [name]    Relationship status: [name]  . Intimate partner violence:    Fear of current or ex partner: [name]    Emotionally abused: [name]    Physically abused: [name]    Forced sexual activity: [name]  Other Topics Concern  . [name]  Social History Narrative  . [name]    Outpatient Medications Prior to Visit  Medication Sig Dispense Refill  . acetaminophen (TYLENOL) 325 MG tablet Take 650 mg by mouth.    Marland Kitchen albuterol (PROVENTIL HFA;VENTOLIN HFA) 108 (90 Base) MCG/ACT inhaler Inhale 2 puffs into the lungs every 6 (six) hours as needed for wheezing or shortness of breath. 1 Inhaler 1  . amLODipine (NORVASC) 10 MG  tablet Take 1 tablet (10 mg total) by mouth daily. 90 tablet 1  . aspirin EC 81 MG tablet Take 1 tablet (81 mg total) by mouth daily. 90 tablet 1  . carvedilol (COREG) 25 MG tablet Take 1 tablet (25 mg total) by mouth 2 (two) times daily with a meal. 180 tablet 1  . Cholecalciferol (VITAMIN D3) 5000 UNITS CAPS Take 1 capsule by mouth daily.    Marland Kitchen conjugated estrogens (PREMARIN) vaginal cream Place vaginally.    . docusate sodium (COLACE) 50 MG capsule Take 50 mg by mouth 2 (two) times daily.    . ergocalciferol (VITAMIN D2) 50000 units capsule Take 1 capsule (50,000 Units total) by mouth once a week. 12 capsule 1  . escitalopram (LEXAPRO) 10 MG tablet Take 10 mg by mouth daily.    . famotidine (PEPCID) 40 MG tablet Take 1 tablet (40 mg total) by mouth at bedtime. 90 tablet 1  . fluticasone (FLONASE) 50 MCG/ACT nasal spray Place 2 sprays into both nostrils daily. 16 g 6  . furosemide (LASIX) 20 MG tablet Take 1 tablet (20 mg total) by mouth daily as needed (WT gain>3# in 24 HR). 90 tablet 1  . HYDROcodone-homatropine (HYCODAN) 5-1.5 MG/5ML syrup Take 5 mLs by mouth every 6 (six) hours as needed for cough. 140 mL 0  . metoCLOPramide (REGLAN) 10 MG tablet Take 1 tablet (10 mg total) by mouth 4 (four) times daily -  before meals and at bedtime. 60 tablet 4  . modafinil (PROVIGIL) 100 MG tablet Take 1 tablet (100 mg total) by mouth daily. 30 tablet 5  . Multiple Vitamins-Minerals (CENTRUM SILVER ADULT 50+ PO) Take by mouth.    . pantoprazole (PROTONIX) 40 MG tablet Take 1 tablet (40 mg total) by mouth 2 (two) times daily. 180 tablet 1  . potassium chloride SA (KLOR-CON M20) 20 MEQ tablet Take 1 tablet (20 mEq total) by mouth daily. 90 tablet 1  . sucralfate (CARAFATE) 1 g tablet Take 1 tablet (1 g total) by mouth 4 (four) times daily -  with meals and at bedtime. prn 120 tablet 1  . topiramate (TOPAMAX) 25 MG tablet Take 1 tablet (25 mg total) by mouth 2 (two) times daily. 180 tablet 1  .  triamterene-hydrochlorothiazide (MAXZIDE-25) 37.5-25 MG tablet Take 1 tablet by mouth daily. 90 tablet 1  . zolpidem (AMBIEN) 10 MG tablet Take 10  mg by mouth at bedtime as needed for sleep.     No facility-administered medications prior to visit.     Allergies  Allergen Reactions  . Benazepril Anaphylaxis and Swelling    angioedema Throat and lip swelling  . Codeine Nausea And Vomiting  . Morphine     Redness and hives noted post IV admin on 07/05/17  . Ondansetron Hcl     Redness and hives post IV admin on 07/05/17    Review of Systems  Constitutional: Positive for chills and malaise/fatigue. Negative for fever.  HENT: Negative for congestion.   Eyes: Negative for blurred vision.  Respiratory: Negative for shortness of breath.   Cardiovascular: Negative for chest pain, palpitations and leg swelling.  Gastrointestinal: Positive for abdominal pain and diarrhea. Negative for blood in stool, constipation, melena and nausea.  Genitourinary: Negative for dysuria and frequency.  Musculoskeletal: Positive for myalgias. Negative for falls.  Skin: Negative for rash.  Neurological: Negative for dizziness, loss of consciousness and headaches.  Endo/Heme/Allergies: Negative for environmental allergies.  Psychiatric/Behavioral: Negative for depression. The patient is not nervous/anxious.        Objective:    Physical Exam  Constitutional: She is oriented to person, place, and time. No distress.  HENT:  Head: Normocephalic and atraumatic.  Eyes: Conjunctivae are normal.  Neck: Neck supple. No thyromegaly present.  Cardiovascular: Normal rate, regular rhythm and normal heart sounds.  No murmur heard. Pulmonary/Chest: Effort normal and breath sounds normal. She has no wheezes.  Abdominal: She exhibits no distension and no mass. There is tenderness. There is no rebound and no guarding.  Musculoskeletal: She exhibits no edema.  Lymphadenopathy:    She has no cervical adenopathy.    Neurological: She is alert and oriented to person, place, and time.  Skin: Skin is warm and dry. No rash noted. She is not diaphoretic.  Psychiatric: Judgment normal.    BP 132/68 (BP Location: Left Arm, Patient Position: Sitting, Cuff Size: Normal)   Pulse 87   Temp (!) 97.5 F (36.4 C) (Oral)   Resp 18   Ht 5' 7" (1.702 m)   Wt 178 lb 6.4 oz (80.9 kg)   SpO2 98%   BMI 27.94 kg/m  Wt Readings from Last 3 Encounters:  03/29/18 178 lb 6.4 oz (80.9 kg)  03/18/18 177 lb 9.6 oz (80.6 kg)  02/22/18 176 lb (79.8 kg)   BP Readings from Last 3 Encounters:  03/29/18 132/68  03/18/18 (!) 145/84  02/22/18 (!) 143/90     Immunization History  Administered Date(s) Administered  . Influenza Whole 09/09/2009, 09/12/2013, 07/12/2015  . Influenza,inj,Quad PF,6+ Mos 07/14/2016, 07/27/2017  . Influenza-Unspecified 08/23/2014, 07/06/2015, 07/14/2016, 07/27/2017  . Pneumococcal Conjugate-13 07/12/2015  . Pneumococcal Polysaccharide-23 09/06/2014, 07/12/2015  . Pneumococcal-Unspecified 07/06/2015  . Tdap 12/01/2010, 10/11/2015  . Zoster 10/11/2015, 09/15/2016    Health Maintenance  Topic Date Due  . MAMMOGRAM  12/23/2017  . INFLUENZA VACCINE  06/09/2018  . PAP SMEAR  10/10/2018  . COLONOSCOPY  10/26/2019  . TETANUS/TDAP  10/10/2025  . Hepatitis C Screening  Completed  . HIV Screening  Completed    Lab Results  Component Value Date   WBC 3.0 (L) 03/29/2018   HGB 10.4 (L) 03/29/2018   HCT 31.6 (L) 03/29/2018   PLT 135.0 (L) 03/29/2018   GLUCOSE 85 03/29/2018   CHOL 137 02/15/2018   TRIG 100.0 02/15/2018   HDL 45.90 02/15/2018   LDLCALC 72 02/15/2018   ALT 11 03/29/2018   AST  18 03/29/2018   NA 137 03/29/2018   K 4.0 03/29/2018   CL 106 03/29/2018   CREATININE 0.98 03/29/2018   BUN 25 (H) 03/29/2018   CO2 25 03/29/2018   TSH 1.79 02/15/2018   INR 0.99 11/17/2017    Lab Results  Component Value Date   TSH 1.79 02/15/2018   Lab Results  Component Value Date   WBC  3.0 (L) 03/29/2018   HGB 10.4 (L) 03/29/2018   HCT 31.6 (L) 03/29/2018   MCV 87.3 03/29/2018   PLT 135.0 (L) 03/29/2018   Lab Results  Component Value Date   NA 137 03/29/2018   K 4.0 03/29/2018   CHLORIDE 108 12/31/2016   CO2 25 03/29/2018   GLUCOSE 85 03/29/2018   BUN 25 (H) 03/29/2018   CREATININE 0.98 03/29/2018   BILITOT 0.3 03/29/2018   ALKPHOS 39 03/29/2018   AST 18 03/29/2018   ALT 11 03/29/2018   PROT 9.3 (H) 03/29/2018   ALBUMIN 3.3 (L) 03/29/2018   CALCIUM 9.2 03/29/2018   ANIONGAP 5 02/22/2018   EGFR >90 12/31/2016   GFR 73.52 03/29/2018   Lab Results  Component Value Date   CHOL 137 02/15/2018   Lab Results  Component Value Date   HDL 45.90 02/15/2018   Lab Results  Component Value Date   LDLCALC 72 02/15/2018   Lab Results  Component Value Date   TRIG 100.0 02/15/2018   Lab Results  Component Value Date   CHOLHDL 3 02/15/2018   No results found for: HGBA1C       Assessment & Plan:   Problem List Items Addressed This Visit    Monoclonal gammopathy of unknown significance (MGUS) (Chronic)   GERD (gastroesophageal reflux disease)   Hypertension - Primary    Well controlled, no changes to meds. Encouraged heart healthy diet such as the DASH diet and exercise as tolerated.       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 Start a probiotic again. Has skin irritation rectally. Try to cleanse with Witch hazel astringent and use Desitin with Zinc to protect skin.       Relevant Medications   vancomycin (VANCOCIN) 125 MG capsule    Other Visit Diagnoses    Diarrhea, unspecified type       Relevant Orders   CBC with Differential/Platelet (Completed)   Comprehensive metabolic panel (Completed)   Stool, WBC/Lactoferrin (Completed)   Stool Culture (Completed)   C. difficile GDH and Toxin A/B (Completed)      I am having Gershon Mussel start on vancomycin. I am also having her  maintain her docusate sodium, Vitamin D3, Multiple Vitamins-Minerals (CENTRUM SILVER ADULT 50+ PO), ergocalciferol, conjugated estrogens, acetaminophen, metoCLOPramide, famotidine, escitalopram, zolpidem, carvedilol, topiramate, amLODipine, triamterene-hydrochlorothiazide, furosemide, sucralfate, fluticasone, potassium chloride SA, pantoprazole, aspirin EC, albuterol, modafinil, and HYDROcodone-homatropine.  Meds ordered this encounter  Medications  . vancomycin (VANCOCIN) 125 MG capsule    Sig: Take 1 capsule po q6hr x 14 days Then take 1 capsule po x 7 days then take 1 capsule every 3 days for 8 weeks    Dispense:  81 capsule    Refill:  0    CMA served as scribe during this visit. History, Physical and Plan performed by medical provider. Documentation and orders reviewed and attested to.  Penni Homans, MD

## 2018-03-29 NOTE — Patient Instructions (Addendum)
Try a multistrain probiotic, Culturelle and Phillip's colon health  Clostridium Difficile Infection Clostridium difficile (C. difficile or C. diff) infection is a condition that causes inflammation of the large intestine (colon). This condition can result in damage to the lining of your colon and may lead to colitis. This infection can be passed from person to person (is contagious). What are the causes? C. diff is a bacterium that is normally found in the colon. This infection is caused when the balance of C. diff is changed and there is an overgrowth of C. diff. This is often caused by antibiotic use. What increases the risk? This condition is more likely to develop in people who:  Take antibiotic medicines.  Take a certain type of medicine called proton pump inhibitors over a long period of time (chronic use).  Are older.  Have had a C. diff infection before.  Have serious underlying conditions, such as colon cancer.  Are in the hospital.  Have a weak defense (immune) system.  Live in a place where there is a lot of contact with others, such as a nursing home.  Have had gastrointestinal (GI) tract surgery.  What are the signs or symptoms? Symptoms of this condition include:  Diarrhea. This may be bloody, watery, or yellow or green in color.  Fever.  Fatigue.  Loss of appetite.  Nausea.  Swelling, pain, or tenderness in the abdomen.  Dehydration. Dehydration can cause you to be tired and thirsty, have a dry mouth, and urinate less frequently.  How is this diagnosed? This condition is diagnosed with a medical history and physical exam. You may also have tests, including:  A test that checks for C. diff in your stool.  Blood tests.  A sigmoidoscopy or colonoscopy to look at your colon. These procedures involve passing an instrument through your rectum to look at the inside of your colon.  How is this treated? Treatment for this condition includes:  Antibiotics  that keep C. diff from growing.  Stopping the antibiotics you were on before the C. diff infection began. Only do this as told by your health care provider.  Fluids through an IV tube, if you are dehydrated.  Surgery to remove the infected part of the colon. This is rare.  Follow these instructions at home: Eating and drinking  Drink enough fluid to keep your urine clear or pale yellow. Avoid milk, caffeine, and alcohol.  Follow specific rehydration instructions as told by your health care provider.  Eat small, frequent meals instead of large meals. Medicines  Take your antibiotic medicine as told by your health care provider. Do not stop taking the antibiotic even if you start to feel better unless your health care provider told you to do that.  Take over-the-counter and prescription medicines only as told by your health care provider.  Do not use medicines to help with diarrhea. General instructions  Wash your hands thoroughly before you prepare food and after you use the bathroom. Make sure people who live with you also wash their hands often.  Clean surfaces that you touch with a product that contains chlorine bleach.  Keep all follow-up visits as told by your health care provider. This is important. Contact a health care provider if:  Your symptoms do not get better with treatment.  Your symptoms get worse with treatment.  Your symptoms go away and then return.  You have a fever.  You have new symptoms. Get help right away if:  You have increasing  pain or tenderness in your abdomen.  You have stool that is mostly bloody, or your stool looks dark black and tarry.  You cannot eat or drink without vomiting.  You have signs of dehydration, such as: ? Dark urine, very little urine, or no urine. ? Cracked lips. ? Not making tears when you cry. ? Dry mouth. ? Sunken eyes. ? Sleepiness. ? Weakness. ? Dizziness. This information is not intended to replace advice  given to you by your health care provider. Make sure you discuss any questions you have with your health care provider. Document Released: 08/05/2005 Document Revised: 04/02/2016 Document Reviewed: 04/29/2015 Elsevier Interactive Patient Education  2017 [employer].

## 2018-03-30 ENCOUNTER — Other Ambulatory Visit: Payer: Medicare Other

## 2018-03-30 DIAGNOSIS — R197 Diarrhea, unspecified: Secondary | ICD-10-CM | POA: Diagnosis not present

## 2018-03-30 LAB — COMPREHENSIVE METABOLIC PANEL
ALT: 11 U/L (ref 0–35)
AST: 18 U/L (ref 0–37)
Albumin: 3.3 g/dL — ABNORMAL LOW (ref 3.5–5.2)
Alkaline Phosphatase: 39 U/L (ref 39–117)
BUN: 25 mg/dL — AB (ref 6–23)
CHLORIDE: 106 meq/L (ref 96–112)
CO2: 25 meq/L (ref 19–32)
CREATININE: 0.98 mg/dL (ref 0.40–1.20)
Calcium: 9.2 mg/dL (ref 8.4–10.5)
GFR: 73.52 mL/min (ref >60.00)
GLUCOSE: 85 mg/dL (ref 70–99)
Potassium: 4 mEq/L (ref 3.5–5.1)
SODIUM: 137 meq/L (ref 135–145)
Total Bilirubin: 0.3 mg/dL (ref 0.2–1.2)
Total Protein: 9.3 g/dL — ABNORMAL HIGH (ref 6.0–8.3)

## 2018-03-30 LAB — CBC WITH DIFFERENTIAL/PLATELET
BASOS ABS: 0 10*3/uL (ref 0.0–0.1)
BASOS PCT: 1.4 % (ref 0.0–3.0)
EOS ABS: 0.1 10*3/uL (ref 0.0–0.7)
Eosinophils Relative: 2 % (ref 0.0–5.0)
HCT: 31.6 % — ABNORMAL LOW (ref 36.0–46.0)
Hemoglobin: 10.4 g/dL — ABNORMAL LOW (ref 12.0–15.0)
Lymphocytes Relative: 39 % (ref 12.0–46.0)
Lymphs Abs: 1.2 10*3/uL (ref 0.7–4.0)
MCHC: 33 g/dL (ref 30.0–36.0)
MCV: 87.3 fl (ref 78.0–100.0)
MONO ABS: 0.3 10*3/uL (ref 0.1–1.0)
Monocytes Relative: 9.2 % (ref 3.0–12.0)
NEUTROS ABS: 1.4 10*3/uL (ref 1.4–7.7)
Neutrophils Relative %: 48.4 % (ref 43.0–77.0)
PLATELETS: 135 10*3/uL — AB (ref 150.0–400.0)
RBC: 3.63 Mil/uL — ABNORMAL LOW (ref 3.87–5.11)
RDW: 15.6 % — AB (ref 11.5–15.5)
WBC: 3 10*3/uL — ABNORMAL LOW (ref 4.0–10.5)

## 2018-03-31 MED ORDER — VANCOMYCIN HCL 125 MG PO CAPS: ORAL_CAPSULE | ORAL | 0 refills | Status: DC

## 2018-04-01 ENCOUNTER — Telehealth: Payer: Self-pay

## 2018-04-01 NOTE — Telephone Encounter (Signed)
Provider aware of C-DIFF DECTECTED. Patient aware as well.

## 2018-04-03 LAB — FECAL LACTOFERRIN, QUANT
Fecal Lactoferrin: NEGATIVE
MICRO NUMBER:: 90623092
SPECIMEN QUALITY:: ADEQUATE

## 2018-04-03 LAB — STOOL CULTURE
MICRO NUMBER: 90623093
MICRO NUMBER:: 90623089
MICRO NUMBER:: 90623091
SHIGA RESULT:: NOT DETECTED
SPECIMEN QUALITY: ADEQUATE
SPECIMEN QUALITY: ADEQUATE
SPECIMEN QUALITY:: ADEQUATE

## 2018-04-03 LAB — C. DIFFICILE GDH AND TOXIN A/B
GDH ANTIGEN: DETECTED
MICRO NUMBER: 90623090
SPECIMEN QUALITY:: ADEQUATE
TOXIN A AND B: NOT DETECTED

## 2018-04-03 LAB — CLOSTRIDIUM DIFFICILE TOXIN B, QUALITATIVE, REAL-TIME PCR: Toxigenic C. Difficile by PCR: DETECTED — AB

## 2018-04-04 NOTE — Assessment & Plan Note (Signed)
Well controlled, no changes to meds. Encouraged heart healthy diet such as the DASH diet and exercise as tolerated.  °

## 2018-04-04 NOTE — Assessment & Plan Note (Addendum)
Recurrent on testing so will give prolonged course of Vancomycin. Start a probiotic again. Has skin irritation rectally. Try to cleanse with Witch hazel astringent and use Desitin with Zinc to protect skin.

## 2018-04-06 DIAGNOSIS — Z0000 Encounter for general adult medical examination without abnormal findings: Secondary | ICD-10-CM | POA: Diagnosis not present

## 2018-04-06 DIAGNOSIS — G43809 Other migraine, not intractable, without status migrainosus: Secondary | ICD-10-CM | POA: Diagnosis not present

## 2018-04-06 DIAGNOSIS — I10 Essential (primary) hypertension: Secondary | ICD-10-CM | POA: Diagnosis not present

## 2018-04-06 DIAGNOSIS — A0472 Enterocolitis due to Clostridium difficile, not specified as recurrent: Secondary | ICD-10-CM | POA: Diagnosis not present

## 2018-04-06 DIAGNOSIS — R1084 Generalized abdominal pain: Secondary | ICD-10-CM | POA: Diagnosis not present

## 2018-04-07 DIAGNOSIS — R112 Nausea with vomiting, unspecified: Secondary | ICD-10-CM | POA: Diagnosis not present

## 2018-04-07 DIAGNOSIS — K7689 Other specified diseases of liver: Secondary | ICD-10-CM | POA: Diagnosis not present

## 2018-04-07 DIAGNOSIS — R6883 Chills (without fever): Secondary | ICD-10-CM | POA: Diagnosis not present

## 2018-04-07 DIAGNOSIS — R109 Unspecified abdominal pain: Secondary | ICD-10-CM | POA: Diagnosis not present

## 2018-04-07 DIAGNOSIS — R1110 Vomiting, unspecified: Secondary | ICD-10-CM | POA: Diagnosis not present

## 2018-04-07 DIAGNOSIS — R531 Weakness: Secondary | ICD-10-CM | POA: Diagnosis not present

## 2018-04-07 DIAGNOSIS — R197 Diarrhea, unspecified: Secondary | ICD-10-CM | POA: Diagnosis not present

## 2018-04-07 DIAGNOSIS — R1013 Epigastric pain: Secondary | ICD-10-CM | POA: Diagnosis not present

## 2018-04-07 DIAGNOSIS — N281 Cyst of kidney, acquired: Secondary | ICD-10-CM | POA: Diagnosis not present

## 2018-04-12 ENCOUNTER — Telehealth: Payer: Self-pay

## 2018-04-12 ENCOUNTER — Other Ambulatory Visit: Payer: Self-pay

## 2018-04-12 MED ORDER — FERROUS FUMARATE 325 (106 FE) MG PO TABS: 1.0000 | ORAL_TABLET | Freq: Two times a day (BID) | ORAL | 0 refills | Status: DC

## 2018-04-12 NOTE — Telephone Encounter (Signed)
F/U ED appointment rescheduled with Dr. Charlett Blake per patient and provider request for 6/6 at 0730.

## 2018-04-13 DIAGNOSIS — G43809 Other migraine, not intractable, without status migrainosus: Secondary | ICD-10-CM | POA: Diagnosis not present

## 2018-04-14 ENCOUNTER — Ambulatory Visit (INDEPENDENT_AMBULATORY_CARE_PROVIDER_SITE_OTHER): Payer: Medicare Other | Admitting: Family Medicine

## 2018-04-14 ENCOUNTER — Inpatient Hospital Stay: Payer: Medicare Other | Admitting: Family Medicine

## 2018-04-14 ENCOUNTER — Encounter: Payer: Self-pay | Admitting: Family Medicine

## 2018-04-14 VITALS — BP 108/60 | HR 84 | Temp 97.9°F | Resp 18 | Wt 178.0 lb

## 2018-04-14 DIAGNOSIS — M546 Pain in thoracic spine: Secondary | ICD-10-CM

## 2018-04-14 DIAGNOSIS — R778 Other specified abnormalities of plasma proteins: Secondary | ICD-10-CM | POA: Diagnosis not present

## 2018-04-14 DIAGNOSIS — D62 Acute posthemorrhagic anemia: Secondary | ICD-10-CM

## 2018-04-14 DIAGNOSIS — I10 Essential (primary) hypertension: Secondary | ICD-10-CM | POA: Diagnosis not present

## 2018-04-14 DIAGNOSIS — A0472 Enterocolitis due to Clostridium difficile, not specified as recurrent: Secondary | ICD-10-CM | POA: Diagnosis not present

## 2018-04-14 DIAGNOSIS — K56609 Unspecified intestinal obstruction, unspecified as to partial versus complete obstruction: Secondary | ICD-10-CM

## 2018-04-14 DIAGNOSIS — G4726 Circadian rhythm sleep disorder, shift work type: Secondary | ICD-10-CM | POA: Insufficient documentation

## 2018-04-14 LAB — CBC WITH DIFFERENTIAL/PLATELET
Basophils Absolute: 0 10*3/uL (ref 0.0–0.1)
Basophils Relative: 1.1 % (ref 0.0–3.0)
EOS ABS: 0.1 10*3/uL (ref 0.0–0.7)
EOS PCT: 2.7 % (ref 0.0–5.0)
HEMATOCRIT: 32.9 % — AB (ref 36.0–46.0)
HEMOGLOBIN: 10.9 g/dL — AB (ref 12.0–15.0)
LYMPHS PCT: 37.8 % (ref 12.0–46.0)
Lymphs Abs: 1 10*3/uL (ref 0.7–4.0)
MCHC: 33 g/dL (ref 30.0–36.0)
MCV: 86.3 fl (ref 78.0–100.0)
MONO ABS: 0.2 10*3/uL (ref 0.1–1.0)
Monocytes Relative: 8.6 % (ref 3.0–12.0)
Neutro Abs: 1.3 10*3/uL — ABNORMAL LOW (ref 1.4–7.7)
Neutrophils Relative %: 49.8 % (ref 43.0–77.0)
Platelets: 131 10*3/uL — ABNORMAL LOW (ref 150.0–400.0)
RBC: 3.81 Mil/uL — AB (ref 3.87–5.11)
RDW: 15.8 % — ABNORMAL HIGH (ref 11.5–15.5)
WBC: 2.6 10*3/uL — ABNORMAL LOW (ref 4.0–10.5)

## 2018-04-14 LAB — COMPREHENSIVE METABOLIC PANEL
ALBUMIN: 3.5 g/dL (ref 3.5–5.2)
ALK PHOS: 42 U/L (ref 39–117)
ALT: 13 U/L (ref 0–35)
AST: 21 U/L (ref 0–37)
BUN: 17 mg/dL (ref 6–23)
CO2: 25 mEq/L (ref 19–32)
CREATININE: 0.65 mg/dL (ref 0.40–1.20)
Calcium: 9.2 mg/dL (ref 8.4–10.5)
Chloride: 105 mEq/L (ref 96–112)
GFR: 118.06 mL/min (ref >60.00)
Glucose, Bld: 93 mg/dL (ref 70–99)
Potassium: 3.8 mEq/L (ref 3.5–5.1)
SODIUM: 136 meq/L (ref 135–145)
TOTAL PROTEIN: 9.5 g/dL — AB (ref 6.0–8.3)
Total Bilirubin: 0.4 mg/dL (ref 0.2–1.2)

## 2018-04-14 MED ORDER — ONDANSETRON HCL 4 MG PO TABS: 4.0000 mg | ORAL_TABLET | Freq: Three times a day (TID) | ORAL | 1 refills | Status: DC | PRN

## 2018-04-14 MED ORDER — DICYCLOMINE HCL 20 MG PO TABS: 20.0000 mg | ORAL_TABLET | Freq: Three times a day (TID) | ORAL | 1 refills | Status: DC | PRN

## 2018-04-14 MED ORDER — MODAFINIL 100 MG PO TABS: 100.0000 mg | ORAL_TABLET | Freq: Every day | ORAL | 2 refills | Status: DC

## 2018-04-14 MED ORDER — FAMOTIDINE 40 MG PO TABS: 40.0000 mg | ORAL_TABLET | Freq: Every evening | ORAL | 1 refills | Status: DC | PRN

## 2018-04-14 NOTE — Assessment & Plan Note (Signed)
Long taper of Vancomycin is tolerating but was seen in ed with epigastric pain, is improved with dicyclomine, is given a refill and referred to [facility] for further consideration

## 2018-04-14 NOTE — Progress Notes (Signed)
Subjective:  I acted as a Education administrator for Dr. Charlett Blake. [place], [place]  [name], female    DOB: [birth year], 62 y.o.   MRN: [id number]  No chief complaint on file.   HPI  Patient is in today for an ED follow up. She presented with increased epigastric pain was retested for CDiff and tested negative. She was placed on Dicycylomine tid and her pain has subsided. She continues to struggle with excessive fatigue and myalgias and continues to work some nights. No recent fevers or chills. Denies CP/palp/SOB/HA/congestion/fevers or GU c/o. Taking meds as prescribed  Patient Care Team: Mosie Lukes, MD as PCP - General (Family Medicine)   Past Medical History:  Diagnosis Date  . Abnormal SPEP 07/26/2016  . Arthritis    knees, hands  . Back pain 07/14/2016  . Bowel obstruction ([place]) 11/2014  . C. difficile diarrhea   . CHF (congestive heart failure) ([place])   . Congestive heart failure ([place]) 02/24/2015   S/p pacemaker  . Depression   . Elevated sed rate 08/15/2017  . Epigastric pain 03/22/2017  . GERD (gastroesophageal reflux disease)   . Hyperlipidemia   . Hypertension   . 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 Low back pain 07/14/2016  . 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 Myalgia 12/31/2016  . Obstructive sleep apnea 03/31/2015  . SOB (shortness of breath) 04/09/2016  . Stroke [facility])    TIAs    Past Surgical History:  Procedure Laterality Date  . ABDOMINAL HYSTERECTOMY     menorraghia, 2006, total  . CHOLECYSTECTOMY    . KNEE SURGERY     right, repair torn torn cartialage  . PACEMAKER INSERTION  08/2014  . TONSILLECTOMY    . TUBAL LIGATION      Family History  Problem Relation Age of Onset  . Diabetes Mother   . Hypertension Mother   . Heart disease Mother        s/p 1 stent  . Hyperlipidemia Mother   . Arthritis Mother   . Cancer Father        COLON  . Colon cancer Father 72  . Irritable bowel syndrome Sister   .  Hyperlipidemia Daughter   . Hypertension Daughter   . Hypertension Maternal Grandmother   . Arthritis Maternal Grandmother   . Heart disease Maternal Grandfather        MI  . Hypertension Maternal Grandfather   . Arthritis Maternal Grandfather   . Hypertension Son     Social History   Socioeconomic History  . Marital status: Single    Spouse name: [name]  . Number of children: [name]  . Years of education: [name]  . Highest education level: [name]  Occupational History  . [name]  Social Needs  . Financial resource strain: [name]  . Food insecurity:    Worry: [name]    Inability: [name]  . Transportation needs:    Medical: [name]    Non-medical: [name]  Tobacco Use  . Smoking status: Never Smoker  . Smokeless tobacco: Never Used  Substance and Sexual Activity  . Alcohol use: No    Alcohol/week: 0.0 oz  . Drug use: No  . Sexual activity: [name]  Lifestyle  . Physical activity:    Days per week: [name]    Minutes per session: [name]  . Stress:  [name]  Relationships  . Social connections:    Talks on phone: [name]    Gets together: [name]    Attends religious service: [name]    Active member of club or organization: [name]    Attends meetings of clubs or organizations: [name]    Relationship status: [name]  . Intimate partner violence:    Fear of current or ex partner: [name]    Emotionally abused: [name]    Physically abused: [name]    Forced sexual activity: [name]  Other Topics Concern  . [name]  Social History Narrative  . [name]    Outpatient Medications Prior to Visit  Medication Sig Dispense Refill  . acetaminophen (TYLENOL) 325 MG tablet Take 650 mg by mouth.    Marland Kitchen albuterol (PROVENTIL HFA;VENTOLIN HFA) 108 (90 Base) MCG/ACT inhaler Inhale 2 puffs into the lungs every 6 (six) hours as needed for wheezing or shortness of  breath. 1 Inhaler 1  . amLODipine (NORVASC) 10 MG tablet Take 1 tablet (10 mg total) by mouth daily. 90 tablet 1  . aspirin EC 81 MG tablet Take 1 tablet (81 mg total) by mouth daily. 90 tablet 1  . carvedilol (COREG) 25 MG tablet Take 1 tablet (25 mg total) by mouth 2 (two) times daily with a meal. 180 tablet 1  . Cholecalciferol (VITAMIN D3) 5000 UNITS CAPS Take 1 capsule by mouth daily.    Marland Kitchen conjugated estrogens (PREMARIN) vaginal cream Place vaginally.    . docusate sodium (COLACE) 50 MG capsule Take 50 mg by mouth 2 (two) times daily.    . ergocalciferol (VITAMIN D2) 50000 units capsule Take 1 capsule (50,000 Units total) by mouth once a week. 12 capsule 1  . escitalopram (LEXAPRO) 10 MG tablet Take 10 mg by mouth daily.    . ferrous fumarate (HEMOCYTE - 106 MG FE) 325 (106 Fe) MG TABS tablet Take 1 tablet (106 mg of iron total) by mouth 2 (two) times daily. 60 tablet 0  . fluticasone (FLONASE) 50 MCG/ACT nasal spray Place 2 sprays into both nostrils daily. 16 g 6  . HYDROcodone-homatropine (HYCODAN) 5-1.5 MG/5ML syrup Take 5 mLs by mouth every 6 (six) hours as needed for cough. 140 mL 0  . metoCLOPramide (REGLAN) 10 MG tablet Take 1 tablet (10 mg total) by mouth 4 (four) times daily -  before meals and at bedtime. 60 tablet 4  . Multiple Vitamins-Minerals (CENTRUM SILVER ADULT 50+ PO) Take by mouth.    . pantoprazole (PROTONIX) 40 MG tablet Take 1 tablet (40 mg total) by mouth 2 (two) times daily. 180 tablet 1  . potassium chloride SA (KLOR-CON M20) 20 MEQ tablet Take 1 tablet (20 mEq total) by mouth daily. 90 tablet 1  . sucralfate (CARAFATE) 1 g tablet Take 1 tablet (1 g total) by mouth 4 (four) times daily -  with meals and at bedtime. prn 120 tablet 1  . topiramate (TOPAMAX) 25 MG tablet Take 1 tablet (25 mg total) by mouth 2 (two) times daily. 180 tablet 1  . triamterene-hydrochlorothiazide (MAXZIDE-25) 37.5-25 MG tablet Take 1 tablet by mouth daily. 90 tablet 1  . vancomycin (VANCOCIN)  125 MG capsule Take 1 capsule po q6hr x 14 days Then take 1 capsule po x 7 days then take 1 capsule every 3 days for 8 weeks 81 capsule 0  .  zolpidem (AMBIEN) 10 MG tablet Take 10 mg by mouth at bedtime as needed for sleep.    . famotidine (PEPCID) 40 MG tablet Take 1 tablet (40 mg total) by mouth at bedtime. 90 tablet 1  . furosemide (LASIX) 20 MG tablet Take 1 tablet (20 mg total) by mouth daily as needed (WT gain>3# in 24 HR). 90 tablet 1  . modafinil (PROVIGIL) 100 MG tablet Take 1 tablet (100 mg total) by mouth daily. 30 tablet 5   No facility-administered medications prior to visit.     Allergies  Allergen Reactions  . Benazepril Anaphylaxis and Swelling    angioedema Throat and lip swelling  . Codeine Nausea And Vomiting  . Morphine     Redness and hives noted post IV admin on 07/05/17  . Ondansetron Hcl     Redness and hives post IV admin on 07/05/17    Review of Systems  Constitutional: Positive for malaise/fatigue. Negative for fever.  HENT: Negative for congestion.   Eyes: Negative for blurred vision.  Respiratory: Negative for shortness of breath.   Cardiovascular: Negative for chest pain, palpitations and leg swelling.  Gastrointestinal: Positive for abdominal pain and nausea. Negative for blood in stool and diarrhea.  Genitourinary: Negative for dysuria and frequency.  Musculoskeletal: Negative for falls.  Skin: Negative for rash.  Neurological: Negative for dizziness, loss of consciousness and headaches.  Endo/Heme/Allergies: Negative for environmental allergies.  Psychiatric/Behavioral: Negative for depression. The patient is not nervous/anxious.        Objective:    Physical Exam  Constitutional: She is oriented to person, place, and time. She appears well-developed and well-nourished. No distress.  HENT:  Head: Normocephalic and atraumatic.  Nose: Nose normal.  Eyes: Right eye exhibits no discharge. Left eye exhibits no discharge.  Neck: Normal range of  motion. Neck supple.  Cardiovascular: Normal rate and regular rhythm.  No murmur heard. Pulmonary/Chest: Effort normal and breath sounds normal.  Abdominal: Soft. Bowel sounds are normal. There is no tenderness.  Genitourinary: Uterus normal.  Musculoskeletal: She exhibits no edema.  Neurological: She is alert and oriented to person, place, and time.  Skin: Skin is warm and dry.  Psychiatric: She has a normal mood and affect.  Nursing note and vitals reviewed.   BP 108/60 (BP Location: Left Arm, Patient Position: Sitting, Cuff Size: Normal)   Pulse 84   Temp 97.9 F (36.6 C) (Oral)   Resp 18   Wt 178 lb (80.7 kg)   SpO2 96%   BMI 27.88 kg/m  Wt Readings from Last 3 Encounters:  04/14/18 178 lb (80.7 kg)  03/29/18 178 lb 6.4 oz (80.9 kg)  03/18/18 177 lb 9.6 oz (80.6 kg)   BP Readings from Last 3 Encounters:  04/14/18 108/60  03/29/18 132/68  03/18/18 (!) 145/84     Immunization History  Administered Date(s) Administered  . Influenza Whole 09/09/2009, 09/12/2013, 07/12/2015  . Influenza,inj,Quad PF,6+ Mos 07/14/2016, 07/27/2017  . Influenza-Unspecified 08/23/2014, 07/06/2015, 07/14/2016, 07/27/2017  . Pneumococcal Conjugate-13 07/12/2015  . Pneumococcal Polysaccharide-23 09/06/2014, 07/12/2015  . Pneumococcal-Unspecified 07/06/2015  . Tdap 12/01/2010, 10/11/2015  . Zoster 10/11/2015, 09/15/2016    Health Maintenance  Topic Date Due  . MAMMOGRAM  12/23/2017  . INFLUENZA VACCINE  06/09/2018  . PAP SMEAR  10/10/2018  . COLONOSCOPY  10/26/2019  . TETANUS/TDAP  10/10/2025  . Hepatitis C Screening  Completed  . HIV Screening  Completed    Lab Results  Component Value Date   WBC 3.0 (L)  03/29/2018   HGB 10.4 (L) 03/29/2018   HCT 31.6 (L) 03/29/2018   PLT 135.0 (L) 03/29/2018   GLUCOSE 85 03/29/2018   CHOL 137 02/15/2018   TRIG 100.0 02/15/2018   HDL 45.90 02/15/2018   LDLCALC 72 02/15/2018   ALT 11 03/29/2018   AST 18 03/29/2018   NA 137 03/29/2018   K 4.0  03/29/2018   CL 106 03/29/2018   CREATININE 0.98 03/29/2018   BUN 25 (H) 03/29/2018   CO2 25 03/29/2018   TSH 1.79 02/15/2018   INR 0.99 11/17/2017    Lab Results  Component Value Date   TSH 1.79 02/15/2018   Lab Results  Component Value Date   WBC 3.0 (L) 03/29/2018   HGB 10.4 (L) 03/29/2018   HCT 31.6 (L) 03/29/2018   MCV 87.3 03/29/2018   PLT 135.0 (L) 03/29/2018   Lab Results  Component Value Date   NA 137 03/29/2018   K 4.0 03/29/2018   CHLORIDE 108 12/31/2016   CO2 25 03/29/2018   GLUCOSE 85 03/29/2018   BUN 25 (H) 03/29/2018   CREATININE 0.98 03/29/2018   BILITOT 0.3 03/29/2018   ALKPHOS 39 03/29/2018   AST 18 03/29/2018   ALT 11 03/29/2018   PROT 9.3 (H) 03/29/2018   ALBUMIN 3.3 (L) 03/29/2018   CALCIUM 9.2 03/29/2018   ANIONGAP 5 02/22/2018   EGFR >90 12/31/2016   GFR 73.52 03/29/2018   Lab Results  Component Value Date   CHOL 137 02/15/2018   Lab Results  Component Value Date   HDL 45.90 02/15/2018   Lab Results  Component Value Date   LDLCALC 72 02/15/2018   Lab Results  Component Value Date   TRIG 100.0 02/15/2018   Lab Results  Component Value Date   CHOLHDL 3 02/15/2018   No results found for: HGBA1C       Assessment & Plan:   Problem List Items Addressed This Visit    Bowel obstruction ([place]) - Primary   Relevant Orders   Ambulatory referral to Gastroenterology   Hypertension   Relevant Orders   Comprehensive metabolic panel   Abnormal SPEP    Follows with hematology      Anemia    Repeat CBC continue iron      Relevant Orders   CBC w/Diff   C. difficile colitis    Long taper of Vancomycin is tolerating but was seen in ed with epigastric pain, is improved with dicyclomine, is given a refill and referred to [facility] for further consideration      Relevant Orders   Ambulatory referral to Gastroenterology   Thoracic back pain    Is following with [place] [facility] who recommended she have physical therapy but she would  like to have it her at [facility] so referral placed. Encouraged moist heat and gentle stretching as tolerated. May try NSAIDs and prescription meds as directed and report if symptoms worsen or seek immediate care      Relevant Orders   Ambulatory referral to Physical Therapy   Shift work sleep disorder   Relevant Medications   modafinil (PROVIGIL) 100 MG tablet      I have discontinued Lannette Donath furosemide. I have also changed her famotidine. Additionally, I am having her start on dicyclomine and ondansetron. Lastly, I am having her maintain her docusate sodium, Vitamin D3, Multiple Vitamins-Minerals (CENTRUM SILVER ADULT 50+ PO), ergocalciferol, conjugated estrogens, acetaminophen, metoCLOPramide, escitalopram, zolpidem, carvedilol, topiramate, amLODipine, triamterene-hydrochlorothiazide, sucralfate, fluticasone, potassium chloride SA, pantoprazole, aspirin EC, albuterol,  HYDROcodone-homatropine, vancomycin, ferrous fumarate, and modafinil.  Meds ordered this encounter  Medications  . dicyclomine (BENTYL) 20 MG tablet    Sig: Take 1 tablet (20 mg total) by mouth 3 (three) times daily as needed for spasms.    Dispense:  90 tablet    Refill:  1  . ondansetron (ZOFRAN) 4 MG tablet    Sig: Take 1 tablet (4 mg total) by mouth every 8 (eight) hours as needed for nausea or vomiting.    Dispense:  30 tablet    Refill:  1  . famotidine (PEPCID) 40 MG tablet    Sig: Take 1 tablet (40 mg total) by mouth at bedtime as needed for heartburn or indigestion.    Dispense:  90 tablet    Refill:  1  . modafinil (PROVIGIL) 100 MG tablet    Sig: Take 1 tablet (100 mg total) by mouth daily.    Dispense:  30 tablet    Refill:  2    CMA served as scribe during this visit. History, Physical and Plan performed by medical provider. Documentation and orders reviewed and attested to.  Penni Homans, MD

## 2018-04-14 NOTE — Patient Instructions (Addendum)
Encouraged increased hydration and fiber in diet. Daily probiotics. If bowels not moving can use MOM 2 tbls po in 4 oz of warm prune juice by mouth every 2-3 days. If no results then repeat in 4 hours with  Dulcolax suppository pr, may repeat again in 4 more hours as needed. Seek care if symptoms worsen. Consider daily Miralax and/or Dulcolax if symptoms persist.   Mix Miralax and benfiber    Preventing MDRO Infections Multidrug-resistant organisms (MDRO) are bacteria that have become resistant to antibiotic medicines. This means that antibiotics cannot stop the bacteria from growing. Types of MDRO include:  Methicillin/oxacillin-resistant Staphylococcus aureus (MRSA).  Vancomycin-resistant enterococci (VRE).  Extended-spectrum beta-lactamases (ESBLs).  Clostridium difficile (C. Difficile).  Multi-drug resistant tuberculosis (MDR TB).  Penicillin-resistant Streptococcus pneumonia (PRSP).  Carbapenem-resistant enterobacteriaceae (CRE).  Everyone has good and bad bacteria in his or her body, such as in the stomach or on the skin. Good bacteria help protect the body from infection. However, when you take an antibiotic medicine, it may kill both the good and bad bacteria, which then allows medicine-resistant bacteria to grow. Infections caused by MDRO can be difficult to treat. It is important to follow certain safety measures to prevent the spread of MDRO. What increases the risk? You are more likely to develop a MDRO infection if:  You were treated with an antibiotic medicine for a long time.  You have been in the hospital for a long time.  You recently had major surgery, such as chest or abdominal surgery.  You have a weakened disease-fighting (immune) system. This may be caused by an illness, long-term (chronic) condition, or medical treatment.  You have a catheter that has stayed in for a long time, such as a urinary catheter or vascular access device.  MDRO are usually spread  through hands that have the germs (contaminated hands). MDRO may also be spread through:  Medical equipment that was not cleaned properly.  Shared personal items, such as razors or towels.  Contaminated surfaces, such as a bathroom counter or sink.  Undercooked or raw meat. Animals treated with antibiotics may have medicine-resistant bacteria.  Water or vegetables contaminated with animal feces.  How is this treated? MDRO infections are usually treated with antibiotic medicines that are taken by mouth (oral antibiotics). Treatment depends on the type of MDRO you have. MDRO infections are difficult to treat, and you may need to be hospitalized. Depending on how severe your infection is, you may need other treatments such as:  IV antibiotics.  High-dose antibiotics.  More than one antibiotic.  Antibiotics that you breathe in (inhaled antibiotics), if you have pneumonia.  A machine to help you breathe (ventilator).  What actions can be taken? What hospitals are doing:  Encouraging staff, patients, and visitors to wash hands often with soap and warm water, and to use hand sanitizer when soap and water are not available.  Taking extra steps to prevent infection (contact precautions) with patients who are infected with MDRO. Contact precautions include: ? Having all healthcare workers and visitors wash their hands before and after leaving the room. ? Having all healthcare workers and visitors wear a gown and gloves while in the room, and asking them throw away the gown and gloves before leaving the room.  Prescribing antibiotic medicines only when they are needed. Over-prescribing antibiotic medicines can help the spread of MDRO.  Keeping patients with MDRO in a room by themselves (isolation) or placing them in rooms with other patients who are already  infected with MDRO.  Carefully cleaning and disinfecting hospital rooms and equipment.  Improving communication about which patients  are infected with MDRO or who have been infected in the past.  Closely monitoring and tracking MDRO infections.  Educating staff and patients about the signs of infection. What you can do:  Wash your hands regularly with soap and warm water. If soap and water are not available, use hand sanitizer.  Take antibiotic medicines only when needed. Do not take antibiotic medicines for viral infections such as the common cold.  If you were prescribed an antibiotic medicine, take it only as told by your health care provider. Do not stop taking the antibiotic even if you start to feel better.  Do not share antibiotic medicines with others.  Do not share personal items, such as bath towels or razors.  If you have a catheter, care for it as told by your health care provider.  Keep all wounds clean and dry. Follow your health care provider's instructions about how to care for any wounds you have.  Practice safe food handling. This includes: ? Washing all fruits and vegetables. ? Washing all utensils that have come in contact with raw meat. ? Keeping a separate cutting board for raw meat. ? Cooking meat thoroughly. All poultry (including chicken and [place]) should be cooked to at least 165F (74C). Ground beef, pork, or lamb should be cooked to at least 160F (71C), and whole beef, pork, or lamb should be cooked to at least 145F (63C). ? Washing your hands with soap and warm water before and after cooking, especially after handling raw meat.  Clean and disinfect surfaces that are touched often. Use solutions or products that contain bleach. Do this on a regular basis. What visitors can do:  Although it is rare, visitors can be infected with MDRO. To prevent this, visitors should:  Wash their hands with soap and warm water before and after visiting you. If soap and water are not available, they can use hand sanitizer.  Ask your health care provider if they need to wear gloves and gowns when  they visit you. If they do need to wear these, make sure they throw away the gloves and gowns before they leave your room.  Where to find more information: You can find more information about preventing MDRO infections from:  Centers for Disease Control and Prevention: eBuzzed.gl  Summary  MDRO are bacteria that have become resistant to antibiotic medicines.  You are more likely to be infected with a MDRO if you have been taking an antibiotic medicine for a long time or have been hospitalized for a long time.  If you were prescribed an antibiotic medicine, take it exactly as told by your health care provider.  Wash your hands regularly with soap and warm water. If soap and water are not available, use hand sanitizer.  Ask your health care provider whether your visitors need to wear gloves and gowns when visiting you. This information is not intended to replace advice given to you by your health care provider. Make sure you discuss any questions you have with your health care provider. Document Released: 03/12/2017 Document Revised: 03/12/2017 Document Reviewed: 03/12/2017 Elsevier Interactive Patient Education  2018 [employer].

## 2018-04-14 NOTE — Assessment & Plan Note (Signed)
Repeat CBC continue iron

## 2018-04-14 NOTE — Assessment & Plan Note (Signed)
Follows with hematology

## 2018-04-14 NOTE — Assessment & Plan Note (Signed)
Is following with [place] [facility] who recommended she have physical therapy but she would like to have it her at [facility] so referral placed. Encouraged moist heat and gentle stretching as tolerated. May try NSAIDs and prescription meds as directed and report if symptoms worsen or seek immediate care

## 2018-04-18 ENCOUNTER — Inpatient Hospital Stay: Payer: Medicare Other | Admitting: Family Medicine

## 2018-04-19 ENCOUNTER — Inpatient Hospital Stay: Payer: Medicare Other | Admitting: Hematology & Oncology

## 2018-04-19 ENCOUNTER — Inpatient Hospital Stay: Payer: Medicare Other

## 2018-04-22 ENCOUNTER — Ambulatory Visit (INDEPENDENT_AMBULATORY_CARE_PROVIDER_SITE_OTHER): Payer: Medicare Other | Admitting: Orthopaedic Surgery

## 2018-04-28 ENCOUNTER — Other Ambulatory Visit: Payer: Self-pay

## 2018-04-28 ENCOUNTER — Encounter: Payer: Self-pay | Admitting: Physical Therapy

## 2018-04-28 ENCOUNTER — Ambulatory Visit: Payer: Medicare Other | Attending: Family Medicine | Admitting: Physical Therapy

## 2018-04-28 DIAGNOSIS — M546 Pain in thoracic spine: Secondary | ICD-10-CM | POA: Diagnosis not present

## 2018-04-28 DIAGNOSIS — M25511 Pain in right shoulder: Secondary | ICD-10-CM | POA: Insufficient documentation

## 2018-04-28 DIAGNOSIS — M25611 Stiffness of right shoulder, not elsewhere classified: Secondary | ICD-10-CM | POA: Diagnosis not present

## 2018-04-28 DIAGNOSIS — R2681 Unsteadiness on feet: Secondary | ICD-10-CM | POA: Diagnosis not present

## 2018-04-28 NOTE — Therapy (Signed)
[facility] [address]  [place], [place], [address] Phone: [phone]   Fax:  [phone]  Physical Therapy Evaluation  Patient Details  Name: Tami [name] MRN: [id number] Date of Birth: Nov 24, 1955 Referring Provider: Penni Homans, MD   Encounter Date: 04/28/2018  PT End of Session - 04/28/18 1513    Visit Number  1    Number of Visits  13    Date for PT Re-Evaluation  06/09/18    Authorization Type  [employer] Medicare & Medicaid    PT Start Time  1401    PT Stop Time  1500    PT Time Calculation (min)  59 min    Activity Tolerance  Patient tolerated treatment well    Behavior During Therapy  [facility] for tasks assessed/performed       Past Medical History:  Diagnosis Date  . Abnormal SPEP 07/26/2016  . Arthritis    knees, hands  . Back pain 07/14/2016  . Bowel obstruction ([place]) 11/2014  . C. difficile diarrhea   . CHF (congestive heart failure) ([place])   . Congestive heart failure ([place]) 02/24/2015   S/p pacemaker  . Depression   . Elevated sed rate 08/15/2017  . Epigastric pain 03/22/2017  . GERD (gastroesophageal reflux disease)   . Hyperlipidemia   . Hypertension   . 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 Low back pain 07/14/2016  . 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 Myalgia 12/31/2016  . Obstructive sleep apnea 03/31/2015  . SOB (shortness of breath) 04/09/2016  . Stroke [facility])    TIAs    Past Surgical History:  Procedure Laterality Date  . ABDOMINAL HYSTERECTOMY     menorraghia, 2006, total  . CHOLECYSTECTOMY    . KNEE SURGERY     right, repair torn torn cartialage  . PACEMAKER INSERTION  08/2014  . TONSILLECTOMY    . TUBAL LIGATION      There were no vitals filed for this visit.   Subjective Assessment - 04/28/18 1403    Subjective  Patient reports thoracic back pain of a couple years duration but reports recent worsening of pain. Reports MD spoke with her about  having scoliosis. Pain originates in R side of mid LB, tightness in between shoulder blades. Has been having difficulty sleeping, cleaning, reaching overhead with R UE. Unable to say if prolonged walking/standing/sitting inflames pain, but does have trouble climbing stairs and with balance. Took pain meds but haven't been helping anymore.  Pain at best: 0/10, pain at worse: 10/10. Recent R shoulder and wrist pain. Wrist and shoulder pain started on Tuesday of this week, noting she had trouble gripping object but reports these symptoms have since resolved. Also reports she has had intermittent lightheadedness as well. Has a history of mini strokes. Has appointment with MD tomorrow.    Pertinent History  back pain, CHF, pacemaker, epigastric pain, GERD, HLD, HTN, myalgia, stroke, R knee surgery    Limitations  Sitting;Lifting;Standing;Walking;House hold activities    Diagnostic tests  02/22/18 xray thoracic: No acute bony abnormality.  Rightward scoliosis. 02/05/18 CT lumbar: Unchanged lumbar disc and facet degeneration, most notable at L4-5 where there is grade 1 anterolisthesis and mild-to-moderate neural foraminal stenosis.    Patient Stated Goals  "get it better if i can"    Currently in Pain?  Yes    Pain Score  7     Pain Location  Back    Pain Orientation  Mid;Right  Pain Descriptors / Indicators  Aching    Pain Type  Chronic pain    Pain Onset  More than a month ago    Aggravating Factors   unable to describe     Pain Relieving Factors  ibuprofen, heating pad         [facility] PT Assessment - 04/28/18 1424      Assessment   Medical Diagnosis  Thoracic Back Pain (rightward scoliosis)    Referring Provider  Penni Homans, MD    Onset Date/Surgical Date  -- couple years ago    Hand Dominance  Right    Next MD Visit  05/10/18    Prior Therapy  Yes- for legs      Precautions   Precautions  ICD/Pacemaker      Restrictions   Weight Bearing Restrictions  No      Balance Screen   Has the  patient fallen in the past 6 months  No    Has the patient had a decrease in activity level because of a fear of falling?   Yes    Is the patient reluctant to leave their home because of a fear of falling?   No      Home Social worker  Private residence    Living Arrangements  Alone    Available Help at Discharge  -- none    Type of [place] to enter    Entrance Stairs-Number of Steps  4    Entrance Stairs-Rails  None reports she needs some [place]  One level    [place]  None      Prior Function   Level of [place]  Independent    Vocation  On disability since 2016    Leisure  volunteering at food bank and visiting seniors      Cognition   Overall Cognitive Status  Within Functional Limits for tasks assessed      Observation/Other Assessments   Focus on Therapeutic Outcomes (FOTO)   Thoracic: 42 (58% limited, 46% predicted)      Sensation   Light Touch  -- intermittent N/T in R hand and foot      Coordination   Gross Motor Movements are Fluid and Coordinated  No limited by pain, slow      Posture/Postural Control   Posture/Postural Control  Postural limitations    Postural Limitations  Rounded Shoulders;Forward head;Posterior pelvic tilt;Weight shift left    Posture Comments  R sided convexity of thoracolumbar spine with R rib hump      ROM / Strength   AROM / PROM / Strength  Strength;AROM      AROM   AROM Assessment Site  Shoulder;Thoracic    Right/Left Shoulder  Right;Left    Right Shoulder Flexion  154 Degrees    Right Shoulder ABduction  175 Degrees    Right Shoulder Internal Rotation  -- FIR T10    Right Shoulder External Rotation  -- FER T3; pain in deltoid    Thoracic Flexion  distal shin    Thoracic Extension  [facility]    Thoracic - Right Side Bend  joint line    Thoracic - Left Side Bend  joint line    Thoracic - Right Rotation  mildly limited    Thoracic - Left Rotation  mildly limited       Strength   Strength Assessment Site  Hip;Knee;Ankle;Shoulder  Right/Left Shoulder  Right;Left    Right Shoulder Flexion  4+/5    Right Shoulder ABduction  4+/5    Right Shoulder Internal Rotation  3+/5 pain in R deltoid    Right Shoulder External Rotation  3+/5 pain in R deltoid    Left Shoulder Flexion  4+/5    Left Shoulder ABduction  4+/5    Left Shoulder Internal Rotation  3+/5    Left Shoulder External Rotation  3+/5    Right/Left Hip  Right;Left    Right Hip Flexion  4-/5    Right Hip ABduction  4/5    Right Hip ADduction  4/5    Left Hip Flexion  4/5    Left Hip ABduction  4/5    Left Hip ADduction  4/5    Right/Left Knee  Right;Left    Right Knee Flexion  4+/5    Right Knee Extension  4+/5    Left Knee Flexion  4/5    Left Knee Extension  4+/5    Right/Left Ankle  Left;Right    Right Ankle Dorsiflexion  4+/5    Right Ankle Plantar Flexion  4+/5    Left Ankle Dorsiflexion  4+/5    Left Ankle Plantar Flexion  4+/5      Flexibility   Soft Tissue Assessment /Muscle Length  yes    Hamstrings  B HS limited      Palpation   Palpation comment  Patient noting TTP in between scapulae, R thoracolumbar paraspinals, and QL.      Ambulation/Gait   Ambulation/Gait  Yes    Assistive device  None    Gait Pattern  Step-through pattern;Lateral trunk lean to left    Ambulation Surface  Level;Indoor    Gait velocity  decreased                Objective measurements completed on examination: See above findings.              PT Education - 04/28/18 1512    Education Details  prognosis, POC, HEP, edu about scoliosis and stroke symptoms     Person(s) Educated  Patient    Methods  Explanation;Demonstration;Tactile cues;Verbal cues;Handout    Comprehension  Verbalized understanding;Returned demonstration       PT Short Term Goals - 04/28/18 1524      PT SHORT TERM GOAL #1   Title  Patient to be independent with initial HEP.    Time  3    Period   Weeks    Status  New    Target Date  05/19/18        PT Long Term Goals - 04/28/18 1529      PT LONG TERM GOAL #1   Title  Patient to be independent with advanced HEP.    Time  6    Period  Weeks    Status  New    Target Date  06/09/18      PT LONG TERM GOAL #2   Title  Patient to demonstrate [facility] R UE AROM without pain limiting.     Time  6    Period  Weeks    Status  New    Target Date  06/09/18      PT LONG TERM GOAL #3   Title  Patient to demonstrate R UE strength >=4+/5    Time  6    Period  Weeks    Status  New    Target Date  06/09/18  PT LONG TERM GOAL #4   Title  Patient to tolerate overhead reaching with R UE without pain limiting.     Time  6    Period  Weeks    Status  New    Target Date  06/09/18      PT LONG TERM GOAL #5   Title  Patient to demonstrate reciprocal stair climbing up/down 13 steps without handrail with good eccentric control and no evidence of instability.     Time  6    Period  Weeks    Status  New    Target Date  06/09/18      Additional Long Term Goals   Additional Long Term Goals  Yes      PT LONG TERM GOAL #6   Title  Patient to report sleeping 6 hours without c/o thoracic pain.    Time  6    Period  Weeks    Status  New    Target Date  06/09/18             Plan - 04/28/18 1514    Clinical Impression Statement  Patient is a 62y/o F presenting to OPPT with c/o R sided thoracic pain of a couple years duration. Xray on 02/22/18 showing rightward scoliosis. Patient unable to report aggravating factors, but reports difficulty sleeping and cleaning. Patient also with report of R wrist and shoulder pain that started on Tuesday of this week, noting she had trouble gripping objects but reports these symptoms have since resolved. Denies difficulty speaking, swallowing, face droop, numbness. Today patient with no provocation of R thoracic pain with AROM, painful R deltoid with ER, and weakness in R shoulder ER/IR. Patient also  noting TTP in between scapulae, R thoracolumbar paraspinals, and QL. Patient received and educated on HEP; reported understanding. Educated patient on stroke symptoms to watch out for. Advised to seek immediate medical attention if she has these symptoms again. Plan to contact MD to notify of possible stroke-like symptoms.     Clinical Presentation  Stable    Clinical Decision Making  Low    Rehab Potential  Good    Clinical Impairments Affecting Rehab Potential  pacemaker, CHF, GERD, HLD, HTN, stroke, R knee surgery    PT Frequency  2x / week    PT Duration  6 weeks    PT Treatment/Interventions  ADLs/Self Care Home Management;Cryotherapy;Moist Heat;Traction;Gait training;Stair training;Functional mobility training;Therapeutic activities;Therapeutic exercise;Manual techniques;Patient/family education;Neuromuscular re-education;Balance training;Passive range of motion;Dry needling;Energy conservation;Splinting;Taping    PT Next Visit Plan  reassess HEP    Consulted and Agree with Plan of Care  Patient       Patient will benefit from skilled therapeutic intervention in order to improve the following deficits and impairments:  Decreased activity tolerance, Decreased strength, Impaired UE functional use, Pain, Difficulty walking, Decreased mobility, Decreased balance, Decreased range of motion, Postural dysfunction, Impaired flexibility, Hypomobility  Visit Diagnosis: Acute pain of right shoulder  Stiffness of right shoulder, not elsewhere classified  Pain in thoracic spine  Unsteadiness on feet     Problem List Patient Active Problem List   Diagnosis Date Noted  . 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 Pain in thoracic spine 03/18/2018  . Thoracic back pain 02/15/2018  . Bronchitis 02/15/2018  . 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 Pneumonia 11/11/2017  . Cystitis 10/25/2017  . C. difficile colitis 09/15/2017  . Hypokalemia 08/20/2017  . Anemia 08/20/2017  . Elevated sed  rate 08/15/2017  . 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 Epigastric pain 03/22/2017  . Myalgia 12/31/2016  . 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 SOB (shortness of breath) 07/26/2016  . Abnormal SPEP 07/26/2016  . Low back pain 07/14/2016  . Insomnia 12/29/2015  . Tension headache 10/21/2015  . Muscle spasms of neck 10/18/2015  . Dysuria 10/13/2015  . Sinusitis 07/07/2015  . AP (abdominal pain) 07/07/2015  . Cardiomyopathy ([place]) 04/19/2015  . Chest wall pain 04/19/2015  . Obstructive sleep apnea 03/31/2015  . Congestive heart failure ([place]) 02/24/2015  . Hyperlipidemia   . Hypertension   . Depression   . Stroke ([place])   . GERD (gastroesophageal reflux disease)   . Bowel obstruction ([place]) 11/09/2014     Janene Harvey, PT, DPT 04/28/18 3:36 PM   [facility] [address]  [place], [place], [address] Phone: [phone]   Fax:  [phone]  Name: Tami [name] MRN: [id number] Date of Birth: 08-Nov-1956

## 2018-04-29 ENCOUNTER — Other Ambulatory Visit: Payer: Self-pay

## 2018-04-29 ENCOUNTER — Inpatient Hospital Stay (HOSPITAL_BASED_OUTPATIENT_CLINIC_OR_DEPARTMENT_OTHER): Payer: Medicare Other | Admitting: Hematology & Oncology

## 2018-04-29 ENCOUNTER — Inpatient Hospital Stay: Payer: Medicare Other | Attending: Hematology & Oncology

## 2018-04-29 VITALS — BP 163/92 | HR 87 | Temp 98.0°F | Resp 16 | Wt 179.0 lb

## 2018-04-29 DIAGNOSIS — Z79899 Other long term (current) drug therapy: Secondary | ICD-10-CM | POA: Insufficient documentation

## 2018-04-29 DIAGNOSIS — D472 Monoclonal gammopathy: Secondary | ICD-10-CM

## 2018-04-29 DIAGNOSIS — D649 Anemia, unspecified: Secondary | ICD-10-CM | POA: Insufficient documentation

## 2018-04-29 LAB — CMP (CANCER CENTER ONLY)
ALK PHOS: 42 U/L (ref 26–84)
ALT: 19 U/L (ref 10–47)
AST: 29 U/L (ref 11–38)
Albumin: 2.9 g/dL — ABNORMAL LOW (ref 3.5–5.0)
Anion gap: 7 (ref 5–15)
BILIRUBIN TOTAL: 0.6 mg/dL (ref 0.2–1.6)
BUN: 15 mg/dL (ref 7–22)
CHLORIDE: 110 mmol/L — AB (ref 98–108)
CO2: 26 mmol/L (ref 18–33)
CREATININE: 0.6 mg/dL (ref 0.60–1.20)
Calcium: 9 mg/dL (ref 8.0–10.3)
Glucose, Bld: 103 mg/dL (ref 73–118)
Potassium: 3.6 mmol/L (ref 3.3–4.7)
SODIUM: 143 mmol/L (ref 128–145)
TOTAL PROTEIN: 9.7 g/dL — AB (ref 6.4–8.1)

## 2018-04-29 LAB — CBC WITH DIFFERENTIAL (CANCER CENTER ONLY)
Basophils Absolute: 0 10*3/uL (ref 0.0–0.1)
Basophils Relative: 1 %
Eosinophils Absolute: 0.1 10*3/uL (ref 0.0–0.5)
Eosinophils Relative: 2 %
HEMATOCRIT: 32.5 % — AB (ref 34.8–46.6)
HEMOGLOBIN: 10.4 g/dL — AB (ref 11.6–15.9)
LYMPHS ABS: 0.8 10*3/uL — AB (ref 0.9–3.3)
Lymphocytes Relative: 32 %
MCH: 28.3 pg (ref 26.0–34.0)
MCHC: 32 g/dL (ref 32.0–36.0)
MCV: 88.6 fL (ref 81.0–101.0)
MONOS PCT: 5 %
Monocytes Absolute: 0.1 10*3/uL (ref 0.1–0.9)
NEUTROS ABS: 1.5 10*3/uL (ref 1.5–6.5)
NEUTROS PCT: 60 %
Platelet Count: 129 10*3/uL — ABNORMAL LOW (ref 145–400)
RBC: 3.67 MIL/uL — AB (ref 3.70–5.32)
RDW: 15.1 % (ref 11.1–15.7)
WBC: 2.6 10*3/uL — AB (ref 3.9–10.0)

## 2018-04-29 NOTE — Progress Notes (Signed)
Hematology and Oncology Follow Up Visit  Tami [name] [id number] 1955/12/25 61 y.o. 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  She comes in with 1 of her friends.  As always, she has very nice friends.  She almost fell in the bathtub today.  Thankfully, she caught herself.  She has had another episode of C. difficile.  She has been on oral vancomycin.  She is now taking 1 pill every 3 days.  She has had no fever.  She has had no bleeding.  Her appetite has been quite good.  Her last myeloma studies actually looks stable.  Her M spike was 3.0 g/dL.  IgG level was her 4523 milligrams per deciliter.  Her Kappa Lightchain was 13.5 mg/dL.  Overall, her performance status is ECOG 1.  Medications:  Current Outpatient Medications:  .  acetaminophen (TYLENOL) 325 MG tablet, Take 650 mg by mouth., Disp: , Rfl:  .  albuterol (PROVENTIL HFA;VENTOLIN HFA) 108 (90 Base) MCG/ACT inhaler, Inhale 2 puffs into the lungs every 6 (six) hours as needed for wheezing or shortness of breath., Disp: 1 Inhaler, Rfl: 1 .  amLODipine (NORVASC) 10 MG tablet, Take 1 tablet (10 mg total) by mouth daily., Disp: 90 tablet, Rfl: 1 .  aspirin EC 81 MG tablet, Take 1 tablet (81 mg total) by mouth daily., Disp: 90 tablet, Rfl: 1 .  carvedilol (COREG) 25 MG tablet, Take 1 tablet (25 mg total) by mouth 2 (two) times daily with a meal., Disp: 180 tablet, Rfl: 1 .  Cholecalciferol (VITAMIN D3) 5000 UNITS CAPS, Take 1 capsule by mouth daily., Disp: , Rfl:  .  conjugated estrogens (PREMARIN) vaginal cream, Place vaginally., Disp: , Rfl:  .  dicyclomine (BENTYL) 20 MG tablet, Take 1 tablet (20 mg total) by mouth 3 (three) times daily as needed for spasms., Disp: 90 tablet, Rfl: 1 .  docusate sodium (COLACE) 50 MG capsule, Take 50 mg by mouth 2 (two) times daily., Disp:  , Rfl:  .  ergocalciferol (VITAMIN D2) 50000 units capsule, Take 1 capsule (50,000 Units total) by mouth once a week., Disp: 12 capsule, Rfl: 1 .  escitalopram (LEXAPRO) 10 MG tablet, Take 10 mg by mouth daily., Disp: , Rfl:  .  famotidine (PEPCID) 40 MG tablet, Take 1 tablet (40 mg total) by mouth at bedtime as needed for heartburn or indigestion., Disp: 90 tablet, Rfl: 1 .  FERROCITE 324 MG TABS tablet, Take 324 mg by mouth daily., Disp: , Rfl: 0 .  ferrous fumarate (HEMOCYTE - 106 MG FE) 325 (106 Fe) MG TABS tablet, Take 1 tablet (106 mg of iron total) by mouth 2 (two) times daily., Disp: 60 tablet, Rfl: 0 .  fluticasone (FLONASE) 50 MCG/ACT nasal spray, Place 2 sprays into both nostrils daily., Disp: 16 g, Rfl: 6 .  HYDROcodone-homatropine (HYCODAN) 5-1.5 MG/5ML syrup, Take 5 mLs by mouth every 6 (six) hours as needed for cough., Disp: 140 mL, Rfl: 0 .  metoCLOPramide (REGLAN) 10 MG tablet, Take 1 tablet (10 mg total) by mouth 4 (four) times daily -  before meals and at bedtime., Disp: 60 tablet, Rfl: 4 .  modafinil (PROVIGIL) 100 MG tablet, Take 1 tablet (100 mg total) by mouth daily., Disp: 30 tablet, Rfl: 2 .  Multiple Vitamins-Minerals (CENTRUM SILVER ADULT 50+ PO), Take by mouth.,  Disp: , Rfl:  .  ondansetron (ZOFRAN) 4 MG tablet, Take 1 tablet (4 mg total) by mouth every 8 (eight) hours as needed for nausea or vomiting., Disp: 30 tablet, Rfl: 1 .  pantoprazole (PROTONIX) 40 MG tablet, Take 1 tablet (40 mg total) by mouth 2 (two) times daily., Disp: 180 tablet, Rfl: 1 .  potassium chloride SA (KLOR-CON M20) 20 MEQ tablet, Take 1 tablet (20 mEq total) by mouth daily., Disp: 90 tablet, Rfl: 1 .  sucralfate (CARAFATE) 1 g tablet, Take 1 tablet (1 g total) by mouth 4 (four) times daily -  with meals and at bedtime. prn, Disp: 120 tablet, Rfl: 1 .  topiramate (TOPAMAX) 25 MG tablet, Take 1 tablet (25 mg total) by mouth 2 (two) times daily., Disp: 180 tablet, Rfl: 1 .   triamterene-hydrochlorothiazide (MAXZIDE-25) 37.5-25 MG tablet, Take 1 tablet by mouth daily., Disp: 90 tablet, Rfl: 1 .  vancomycin (VANCOCIN) 125 MG capsule, Take 1 capsule po q6hr x 14 days Then take 1 capsule po x 7 days then take 1 capsule every 3 days for 8 weeks, Disp: 81 capsule, Rfl: 0 .  zolpidem (AMBIEN) 10 MG tablet, Take 10 mg by mouth at bedtime as needed for sleep., Disp: , Rfl:   Allergies:  Allergies  Allergen Reactions  . Benazepril Anaphylaxis and Swelling    angioedema Throat and lip swelling  . Codeine Nausea And Vomiting  . Morphine     Redness and hives noted post IV admin on 07/05/17  . Ondansetron Hcl     Redness and hives post IV admin on 07/05/17    Past Medical History, Surgical history, Social history, and Family History were reviewed and updated.  Review of Systems: Review of Systems  Constitutional: Negative.   HENT: Negative.   Eyes: Negative.   Respiratory: Negative.   Cardiovascular: Negative.   Gastrointestinal: Negative.   Genitourinary: Negative.   Musculoskeletal: Negative.   Skin: Negative.   Neurological: Negative.   Endo/Heme/Allergies: Negative.   Psychiatric/Behavioral: Negative.      Physical Exam:  weight is 179 lb (81.2 kg). Her oral temperature is 98 F (36.7 C). Her blood pressure is 163/92 (abnormal) and her pulse is 87. Her respiration is 16 and oxygen saturation is 98%.   Wt Readings from Last 3 Encounters:  04/29/18 179 lb (81.2 kg)  04/14/18 178 lb (80.7 kg)  03/29/18 178 lb 6.4 oz (80.9 kg)     Physical Exam  Constitutional: She is oriented to person, place, and time.  HENT:  Head: Normocephalic and atraumatic.  Mouth/Throat: Oropharynx is clear and moist.  Eyes: Pupils are equal, round, and reactive to light. EOM are normal.  Neck: Normal range of motion.  Cardiovascular: Normal rate, regular rhythm and normal heart sounds.  Pulmonary/Chest: Effort normal and breath sounds normal.  Abdominal: Soft. Bowel  sounds are normal.  Musculoskeletal: Normal range of motion. She exhibits no edema, tenderness or deformity.  Lymphadenopathy:    She has no cervical adenopathy.  Neurological: She is alert and oriented to person, place, and time.  Skin: Skin is warm and dry. No rash noted. No erythema.  Psychiatric: She has a normal mood and affect. Her behavior is normal. Judgment and thought content normal.  Vitals reviewed.  a.   Lab Results  Component Value Date   WBC 2.6 (L) 04/29/2018   HGB 10.4 (L) 04/29/2018   HCT 32.5 (L) 04/29/2018   MCV 88.6 04/29/2018   PLT 129 (L) 04/29/2018  Chemistry      Component Value Date/Time   NA 143 04/29/2018 0846   NA 143 11/08/2017 1127   NA 140 12/31/2016 1000   K 3.6 04/29/2018 0846   K 4.0 11/08/2017 1127   K 3.6 12/31/2016 1000   CL 110 (H) 04/29/2018 0846   CL 107 11/08/2017 1127   CO2 26 04/29/2018 0846   CO2 25 11/08/2017 1127   CO2 24 12/31/2016 1000   BUN 15 04/29/2018 0846   BUN 19 11/08/2017 1127   BUN 22.3 12/31/2016 1000   CREATININE 0.60 04/29/2018 0846   CREATININE 0.9 11/08/2017 1127   CREATININE 0.8 12/31/2016 1000      Component Value Date/Time   CALCIUM 9.0 04/29/2018 0846   CALCIUM 9.9 11/08/2017 1127   CALCIUM 9.5 12/31/2016 1000   ALKPHOS 42 04/29/2018 0846   ALKPHOS 44 11/08/2017 1127   ALKPHOS 51 12/31/2016 1000   AST 29 04/29/2018 0846   AST 28 12/31/2016 1000   ALT 19 04/29/2018 0846   ALT 36 11/08/2017 1127   ALT 16 12/31/2016 1000   BILITOT 0.6 04/29/2018 0846   BILITOT 0.46 12/31/2016 1000         Impression and Plan: Tami [name] is a 62 year old African-American female.  It is still somewhat debatable as to whether or not we need to embark upon therapy.  She is still pretty much asymptomatic.  She has very mild anemia.  She has no renal issues.  She has no bone issues.  I still think we have to follow her every 2 months.  If we do find a significant change in her myeloma panel, then we will move  ahead with treatment.    [name], MD 6/21/20199:51 AM

## 2018-04-30 LAB — IGG, IGA, IGM
IGG (IMMUNOGLOBIN G), SERUM: 4080 mg/dL — AB (ref 700–1600)
IGM (IMMUNOGLOBULIN M), SRM: 145 mg/dL (ref 26–217)
IgA: 278 mg/dL (ref 87–352)

## 2018-05-02 LAB — KAPPA/LAMBDA LIGHT CHAINS
Kappa free light chain: 127 mg/L — ABNORMAL HIGH (ref 3.3–19.4)
Kappa, lambda light chain ratio: 11.44 — ABNORMAL HIGH (ref 0.26–1.65)
Lambda free light chains: 11.1 mg/L (ref 5.7–26.3)

## 2018-05-04 ENCOUNTER — Ambulatory Visit: Payer: Medicare Other

## 2018-05-04 LAB — PROTEIN ELECTROPHORESIS, SERUM, WITH REFLEX
A/G Ratio: 0.6 — ABNORMAL LOW (ref 0.7–1.7)
ALBUMIN ELP: 3.3 g/dL (ref 2.9–4.4)
ALPHA-1-GLOBULIN: 0.2 g/dL (ref 0.0–0.4)
Alpha-2-Globulin: 0.9 g/dL (ref 0.4–1.0)
Beta Globulin: 1.1 g/dL (ref 0.7–1.3)
GAMMA GLOBULIN: 3.6 g/dL — AB (ref 0.4–1.8)
Globulin, Total: 5.9 g/dL — ABNORMAL HIGH (ref 2.2–3.9)
M-Spike, %: 2.8 g/dL — ABNORMAL HIGH
SPEP INTERP: 0
TOTAL PROTEIN ELP: 9.2 g/dL — AB (ref 6.0–8.5)

## 2018-05-04 LAB — IMMUNOFIXATION REFLEX, SERUM
IGA: 279 mg/dL (ref 87–352)
IGG (IMMUNOGLOBIN G), SERUM: 4292 mg/dL — AB (ref 700–1600)
IGM (IMMUNOGLOBULIN M), SRM: 136 mg/dL (ref 26–217)

## 2018-05-06 DIAGNOSIS — K7689 Other specified diseases of liver: Secondary | ICD-10-CM | POA: Diagnosis not present

## 2018-05-06 DIAGNOSIS — R079 Chest pain, unspecified: Secondary | ICD-10-CM | POA: Diagnosis not present

## 2018-05-06 DIAGNOSIS — R112 Nausea with vomiting, unspecified: Secondary | ICD-10-CM | POA: Diagnosis not present

## 2018-05-06 DIAGNOSIS — I509 Heart failure, unspecified: Secondary | ICD-10-CM | POA: Diagnosis not present

## 2018-05-06 DIAGNOSIS — R197 Diarrhea, unspecified: Secondary | ICD-10-CM | POA: Diagnosis not present

## 2018-05-06 DIAGNOSIS — I517 Cardiomegaly: Secondary | ICD-10-CM | POA: Diagnosis not present

## 2018-05-06 DIAGNOSIS — R51 Headache: Secondary | ICD-10-CM | POA: Diagnosis not present

## 2018-05-06 DIAGNOSIS — R0602 Shortness of breath: Secondary | ICD-10-CM | POA: Diagnosis not present

## 2018-05-06 DIAGNOSIS — I110 Hypertensive heart disease with heart failure: Secondary | ICD-10-CM | POA: Diagnosis not present

## 2018-05-06 DIAGNOSIS — R195 Other fecal abnormalities: Secondary | ICD-10-CM | POA: Diagnosis not present

## 2018-05-06 DIAGNOSIS — Z885 Allergy status to narcotic agent status: Secondary | ICD-10-CM | POA: Diagnosis not present

## 2018-05-06 DIAGNOSIS — N281 Cyst of kidney, acquired: Secondary | ICD-10-CM | POA: Diagnosis not present

## 2018-05-06 DIAGNOSIS — R5383 Other fatigue: Secondary | ICD-10-CM | POA: Diagnosis not present

## 2018-05-06 DIAGNOSIS — R0789 Other chest pain: Secondary | ICD-10-CM | POA: Diagnosis not present

## 2018-05-06 DIAGNOSIS — Z888 Allergy status to other drugs, medicaments and biological substances status: Secondary | ICD-10-CM | POA: Diagnosis not present

## 2018-05-06 DIAGNOSIS — R1084 Generalized abdominal pain: Secondary | ICD-10-CM | POA: Diagnosis not present

## 2018-05-07 DIAGNOSIS — R197 Diarrhea, unspecified: Secondary | ICD-10-CM | POA: Diagnosis not present

## 2018-05-07 DIAGNOSIS — N281 Cyst of kidney, acquired: Secondary | ICD-10-CM | POA: Diagnosis not present

## 2018-05-07 DIAGNOSIS — R0789 Other chest pain: Secondary | ICD-10-CM | POA: Diagnosis not present

## 2018-05-07 DIAGNOSIS — R079 Chest pain, unspecified: Secondary | ICD-10-CM | POA: Diagnosis not present

## 2018-05-07 DIAGNOSIS — R1084 Generalized abdominal pain: Secondary | ICD-10-CM | POA: Diagnosis not present

## 2018-05-07 DIAGNOSIS — R112 Nausea with vomiting, unspecified: Secondary | ICD-10-CM | POA: Diagnosis not present

## 2018-05-08 DIAGNOSIS — R0602 Shortness of breath: Secondary | ICD-10-CM | POA: Diagnosis not present

## 2018-05-09 ENCOUNTER — Ambulatory Visit: Payer: Medicare Other | Attending: Family Medicine | Admitting: Physical Therapy

## 2018-05-09 DIAGNOSIS — M25511 Pain in right shoulder: Secondary | ICD-10-CM | POA: Insufficient documentation

## 2018-05-09 DIAGNOSIS — J0110 Acute frontal sinusitis, unspecified: Secondary | ICD-10-CM | POA: Diagnosis not present

## 2018-05-09 DIAGNOSIS — M545 Low back pain: Secondary | ICD-10-CM | POA: Diagnosis not present

## 2018-05-09 DIAGNOSIS — M25611 Stiffness of right shoulder, not elsewhere classified: Secondary | ICD-10-CM | POA: Insufficient documentation

## 2018-05-09 DIAGNOSIS — R51 Headache: Secondary | ICD-10-CM | POA: Diagnosis not present

## 2018-05-09 DIAGNOSIS — M546 Pain in thoracic spine: Secondary | ICD-10-CM | POA: Insufficient documentation

## 2018-05-09 DIAGNOSIS — R2681 Unsteadiness on feet: Secondary | ICD-10-CM | POA: Insufficient documentation

## 2018-05-10 ENCOUNTER — Ambulatory Visit: Payer: Medicare Other | Admitting: Family Medicine

## 2018-05-13 DIAGNOSIS — G4452 New daily persistent headache (NDPH): Secondary | ICD-10-CM | POA: Diagnosis not present

## 2018-05-13 DIAGNOSIS — D801 Nonfamilial hypogammaglobulinemia: Secondary | ICD-10-CM | POA: Diagnosis not present

## 2018-05-13 DIAGNOSIS — R5383 Other fatigue: Secondary | ICD-10-CM | POA: Diagnosis not present

## 2018-05-13 DIAGNOSIS — D61818 Other pancytopenia: Secondary | ICD-10-CM | POA: Diagnosis not present

## 2018-05-16 ENCOUNTER — Ambulatory Visit: Payer: Medicare Other | Admitting: Physical Therapy

## 2018-05-18 ENCOUNTER — Ambulatory Visit: Payer: Medicare Other

## 2018-05-23 ENCOUNTER — Encounter: Payer: Self-pay | Admitting: Physical Therapy

## 2018-05-23 ENCOUNTER — Ambulatory Visit: Payer: Medicare Other | Admitting: Physical Therapy

## 2018-05-23 DIAGNOSIS — M25511 Pain in right shoulder: Secondary | ICD-10-CM | POA: Diagnosis not present

## 2018-05-23 DIAGNOSIS — R2681 Unsteadiness on feet: Secondary | ICD-10-CM

## 2018-05-23 DIAGNOSIS — M546 Pain in thoracic spine: Secondary | ICD-10-CM

## 2018-05-23 DIAGNOSIS — M25611 Stiffness of right shoulder, not elsewhere classified: Secondary | ICD-10-CM

## 2018-05-23 NOTE — Therapy (Signed)
[facility] [address]  [place], [place], [address] Phone: [phone]   Fax:  [phone]  Physical Therapy Treatment  Patient Details  Name: Tami [name] MRN: [id number] Date of Birth: 25-Nov-1955 Referring Provider: Penni Homans, MD   Encounter Date: 05/23/2018  PT End of Session - 05/23/18 1653    Visit Number  2    Number of Visits  13    Date for PT Re-Evaluation  06/09/18    Authorization Type  [employer]HC Medicare & Medicaid    PT Start Time  1610    PT Stop Time  1648    PT Time Calculation (min)  38 min    Activity Tolerance  Patient tolerated treatment well;Patient limited by fatigue    Behavior During Therapy  [facility] for tasks assessed/performed       Past Medical History:  Diagnosis Date  . Abnormal SPEP 07/26/2016  . Arthritis    knees, hands  . Back pain 07/14/2016  . Bowel obstruction ([place]) 11/2014  . C. difficile diarrhea   . CHF (congestive heart failure) ([place])   . Congestive heart failure ([place]) 02/24/2015   S/p pacemaker  . Depression   . Elevated sed rate 08/15/2017  . Epigastric pain 03/22/2017  . GERD (gastroesophageal reflux disease)   . Hyperlipidemia   . Hypertension   . 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 Low back pain 07/14/2016  . 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 Myalgia 12/31/2016  . Obstructive sleep apnea 03/31/2015  . SOB (shortness of breath) 04/09/2016  . Stroke [facility])    TIAs    Past Surgical History:  Procedure Laterality Date  . ABDOMINAL HYSTERECTOMY     menorraghia, 2006, total  . CHOLECYSTECTOMY    . KNEE SURGERY     right, repair torn torn cartialage  . PACEMAKER INSERTION  08/2014  . TONSILLECTOMY    . TUBAL LIGATION      There were no vitals filed for this visit.  Subjective Assessment - 05/23/18 1613    Subjective  Reports she has had a few deaths in her family and has been under the weather- reports she had been in and out of  the ER due to C.Diff. Reports now she has completed course of antibiotics fro C. Diff. Sees GI MD on Wed for followup. Reports R shoulder is getting better and is doing exercises at home.  Reports lightheadedness has been cleared up-  reports "my vertigo has been off and they gave me some antibiotics shots." Also reports she gets fatigued pretty quickly- reports her cardiologist says it is her pacemaker.    Pertinent History  back pain, CHF, pacemaker, epigastric pain, GERD, HLD, HTN, myalgia, stroke, R knee surgery    Diagnostic tests  02/22/18 xray thoracic: No acute bony abnormality.  Rightward scoliosis. 02/05/18 CT lumbar: Unchanged lumbar disc and facet degeneration, most notable at L4-5 where there is grade 1 anterolisthesis and mild-to-moderate neural foraminal stenosis.    Patient Stated Goals  "get it better if i can"    Currently in Pain?  No/denies                       [facility] Adult PT Treatment/Exercise - 05/23/18 0001      Exercises   Exercises  Lumbar;Shoulder      Lumbar Exercises: Aerobic   Nustep  L2x6 min      Lumbar Exercises: Seated   Other  Seated Lumbar Exercises  B side pallof press 10x with red TB      Lumbar Exercises: Sidelying   Other Sidelying Lumbar Exercises  sidelying thoracic rotation stretch; 10x each      Lumbar Exercises: Quadruped   Madcat/Old Horse  10 reps    Madcat/Old Horse Limitations  TCs for form    Other Quadruped Lumbar Exercises  child pose 10x heavy cues for form      Shoulder Exercises: Standing   Horizontal ABduction  Strengthening;Both;10 reps;Theraband    Theraband Level (Shoulder Horizontal ABduction)  Level 2 (Red)    Horizontal ABduction Limitations  heavy TC/VCs for form    Row  Strengthening;Both;10 reps;Theraband    Theraband Level (Shoulder Row)  Level 2 (Red)      Manual Therapy   Manual Therapy  Soft tissue mobilization    Soft tissue mobilization  R LS, rhomboid, thoracic paraspinal- TTP and significant  soft tissue restriction; self-STM with ball on wall x 3 min               PT Short Term Goals - 05/23/18 1659      PT SHORT TERM GOAL #1   Title  Patient to be independent with initial HEP.    Time  3    Period  Weeks    Status  Achieved        PT Long Term Goals - 04/28/18 1529      PT LONG TERM GOAL #1   Title  Patient to be independent with advanced HEP.    Time  6    Period  Weeks    Status  New    Target Date  06/09/18      PT LONG TERM GOAL #2   Title  Patient to demonstrate [facility] R UE AROM without pain limiting.     Time  6    Period  Weeks    Status  New    Target Date  06/09/18      PT LONG TERM GOAL #3   Title  Patient to demonstrate R UE strength >=4+/5    Time  6    Period  Weeks    Status  New    Target Date  06/09/18      PT LONG TERM GOAL #4   Title  Patient to tolerate overhead reaching with R UE without pain limiting.     Time  6    Period  Weeks    Status  New    Target Date  06/09/18      PT LONG TERM GOAL #5   Title  Patient to demonstrate reciprocal stair climbing up/down 13 steps without handrail with good eccentric control and no evidence of instability.     Time  6    Period  Weeks    Status  New    Target Date  06/09/18      Additional Long Term Goals   Additional Long Term Goals  Yes      PT LONG TERM GOAL #6   Title  Patient to report sleeping 6 hours without c/o thoracic pain.    Time  6    Period  Weeks    Status  New    Target Date  06/09/18            Plan - 05/23/18 1653    Clinical Impression Statement  Patient arrived to session after nearly a month- long hiatus from PT. Patient reporting  she was in and out of the ED for C. Diff and vertigo, and had a few deaths in her family. Reports both conditions have now resolved. Patient reports compliance with HEP during this hiatus. Tolerated STM to R LS, rhomboid, thoracic paraspinal- TTP and significant soft tissue restriction; also educated on self-STM with ball  on wall for continued relief at home. Performed cat/cow and child pose for spinal mobility- patient requiring heavy VC/TCs for encouraging movement throughout entire spine. Patient also requiring heavy curing during periscapular strengthening. Patient reporting short-lasting fatigue after sets of exercises; reports this is an ongoing symptom that cardiologist reports is d/t her pacemaker. Completed session without SOB or pain.    PT Treatment/Interventions  ADLs/Self Care Home Management;Cryotherapy;Moist Heat;Traction;Gait training;Stair training;Functional mobility training;Therapeutic activities;Therapeutic exercise;Manual techniques;Patient/family education;Neuromuscular re-education;Balance training;Passive range of motion;Dry needling;Energy conservation;Splinting;Taping    PT Next Visit Plan  reassess cat/cow and child pose    Consulted and Agree with Plan of Care  Patient       Patient will benefit from skilled therapeutic intervention in order to improve the following deficits and impairments:  Decreased activity tolerance, Decreased strength, Impaired UE functional use, Pain, Difficulty walking, Decreased mobility, Decreased balance, Decreased range of motion, Postural dysfunction, Impaired flexibility, Hypomobility  Visit Diagnosis: Acute pain of right shoulder  Stiffness of right shoulder, not elsewhere classified  Pain in thoracic spine  Unsteadiness on feet     Problem List Patient Active Problem List   Diagnosis Date Noted  . 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 Pain in thoracic spine 03/18/2018  . Thoracic back pain 02/15/2018  . Bronchitis 02/15/2018  . 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 Pneumonia 11/11/2017  . Cystitis 10/25/2017  . C. difficile colitis 09/15/2017  . Hypokalemia 08/20/2017  . Anemia 08/20/2017  . Elevated sed rate 08/15/2017  . 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 Epigastric pain 03/22/2017  . Myalgia  12/31/2016  . 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 SOB (shortness of breath) 07/26/2016  . Abnormal SPEP 07/26/2016  . Low back pain 07/14/2016  . Insomnia 12/29/2015  . Tension headache 10/21/2015  . Muscle spasms of neck 10/18/2015  . Dysuria 10/13/2015  . Sinusitis 07/07/2015  . AP (abdominal pain) 07/07/2015  . Cardiomyopathy ([place]) 04/19/2015  . Chest wall pain 04/19/2015  . Obstructive sleep apnea 03/31/2015  . Congestive heart failure ([place]) 02/24/2015  . Hyperlipidemia   . Hypertension   . Depression   . Stroke ([place])   . GERD (gastroesophageal reflux disease)   . Bowel obstruction ([place]) 11/09/2014    Janene Harvey, PT, DPT 05/23/18 5:00 PM   [facility] [address]  [place], [place], [address] Phone: [phone]   Fax:  [phone]  Name: Tami [name] MRN: [id number] Date of Birth: [birth year]

## 2018-05-25 ENCOUNTER — Ambulatory Visit: Payer: Medicare Other | Admitting: Physical Therapy

## 2018-05-25 ENCOUNTER — Encounter: Payer: Self-pay | Admitting: Physical Therapy

## 2018-05-25 DIAGNOSIS — M25511 Pain in right shoulder: Secondary | ICD-10-CM

## 2018-05-25 DIAGNOSIS — M25611 Stiffness of right shoulder, not elsewhere classified: Secondary | ICD-10-CM

## 2018-05-25 DIAGNOSIS — G43A0 Cyclical vomiting, not intractable: Secondary | ICD-10-CM | POA: Diagnosis not present

## 2018-05-25 DIAGNOSIS — M546 Pain in thoracic spine: Secondary | ICD-10-CM

## 2018-05-25 DIAGNOSIS — R2681 Unsteadiness on feet: Secondary | ICD-10-CM | POA: Diagnosis not present

## 2018-05-25 DIAGNOSIS — R1013 Epigastric pain: Secondary | ICD-10-CM | POA: Diagnosis not present

## 2018-05-25 NOTE — Therapy (Signed)
[facility] [address]  [place], [place], [address] Phone: [phone]   Fax:  [phone]  Physical Therapy Treatment  Patient Details  Name: Tami [name] MRN: [id number] Date of Birth: 08/08/1956 Referring Provider: Penni Homans, MD   Encounter Date: 05/25/2018  PT End of Session - 05/25/18 1820    Visit Number  3    Number of Visits  13    Date for PT Re-Evaluation  06/09/18    Authorization Type  [employer]HC Medicare & Medicaid    PT Start Time  1622    PT Stop Time  1655    PT Time Calculation (min)  33 min    Activity Tolerance  Patient tolerated treatment well;Patient limited by fatigue    Behavior During Therapy  [facility] for tasks assessed/performed       Past Medical History:  Diagnosis Date  . Abnormal SPEP 07/26/2016  . Arthritis    knees, hands  . Back pain 07/14/2016  . Bowel obstruction ([place]) 11/2014  . C. difficile diarrhea   . CHF (congestive heart failure) ([place])   . Congestive heart failure ([place]) 02/24/2015   S/p pacemaker  . Depression   . Elevated sed rate 08/15/2017  . Epigastric pain 03/22/2017  . GERD (gastroesophageal reflux disease)   . Hyperlipidemia   . Hypertension   . 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 Low back pain 07/14/2016  . 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 Myalgia 12/31/2016  . Obstructive sleep apnea 03/31/2015  . SOB (shortness of breath) 04/09/2016  . Stroke [facility])    TIAs    Past Surgical History:  Procedure Laterality Date  . ABDOMINAL HYSTERECTOMY     menorraghia, 2006, total  . CHOLECYSTECTOMY    . KNEE SURGERY     right, repair torn torn cartialage  . PACEMAKER INSERTION  08/2014  . TONSILLECTOMY    . TUBAL LIGATION      There were no vitals filed for this visit.  Subjective Assessment - 05/25/18 1625    Subjective  Reports she was at another appointment. Getting a procedure Tuesday to look into her stomach. Reports R  shoulder/scapula was sore the day after last session but went away.    Pertinent History  back pain, CHF, pacemaker, epigastric pain, GERD, HLD, HTN, myalgia, stroke, R knee surgery    Diagnostic tests  02/22/18 xray thoracic: No acute bony abnormality.  Rightward scoliosis. 02/05/18 CT lumbar: Unchanged lumbar disc and facet degeneration, most notable at L4-5 where there is grade 1 anterolisthesis and mild-to-moderate neural foraminal stenosis.    Patient Stated Goals  "get it better if i can"    Currently in Pain?  No/denies                       [facility] Adult PT Treatment/Exercise - 05/25/18 0001      Lumbar Exercises: Aerobic   Nustep  L2x6 min unable to tolerate level 3 d/t fatigue      Shoulder Exercises: Seated   External Rotation  Both;Strengthening;15 reps;Theraband;Limitations    Theraband Level (Shoulder External Rotation)  Level 2 (Red)    External Rotation Limitations  VCs to decrease speed    Diagonals  Right;Strengthening;10 reps;Theraband;Limitations    Theraband Level (Shoulder Diagonals)  Level 2 (Red)    Other Seated Exercises  D2 flexion R UE; heavy VCs for form      Shoulder Exercises: Prone   Other  Prone Exercises  prone I, T, Y on R UE; 10x each TCs for UE placemence and cues to slow down    Other Prone Exercises  R UE prone row 15x 3#      Shoulder Exercises: Standing   Horizontal ABduction  Strengthening;Both;10 reps;Theraband    Theraband Level (Shoulder Horizontal ABduction)  Level 2 (Red)    Horizontal ABduction Limitations  heavy TC/VCs for form    Row  Strengthening;Both;Theraband;15 reps    Theraband Level (Shoulder Row)  Level 3 (Green)      Manual Therapy   Manual Therapy  Soft tissue mobilization    Soft tissue mobilization  R LS, rhomboid, thoracic paraspinal- TTP and significant soft tissue restriction; tenderness decreased after easing off               PT Short Term Goals - 05/23/18 1659      PT SHORT TERM GOAL #1    Title  Patient to be independent with initial HEP.    Time  3    Period  Weeks    Status  Achieved        PT Long Term Goals - 04/28/18 1529      PT LONG TERM GOAL #1   Title  Patient to be independent with advanced HEP.    Time  6    Period  Weeks    Status  New    Target Date  06/09/18      PT LONG TERM GOAL #2   Title  Patient to demonstrate [facility] R UE AROM without pain limiting.     Time  6    Period  Weeks    Status  New    Target Date  06/09/18      PT LONG TERM GOAL #3   Title  Patient to demonstrate R UE strength >=4+/5    Time  6    Period  Weeks    Status  New    Target Date  06/09/18      PT LONG TERM GOAL #4   Title  Patient to tolerate overhead reaching with R UE without pain limiting.     Time  6    Period  Weeks    Status  New    Target Date  06/09/18      PT LONG TERM GOAL #5   Title  Patient to demonstrate reciprocal stair climbing up/down 13 steps without handrail with good eccentric control and no evidence of instability.     Time  6    Period  Weeks    Status  New    Target Date  06/09/18      Additional Long Term Goals   Additional Long Term Goals  Yes      PT LONG TERM GOAL #6   Title  Patient to report sleeping 6 hours without c/o thoracic pain.    Time  6    Period  Weeks    Status  New    Target Date  06/09/18            Plan - 05/25/18 1820    Clinical Impression Statement  Patient arrived late to session, reporting she just came from an appointment with GI MD. Patient noting no new symptoms today. Tolerated STM to R rhomboids, LS, thoracic paraspinals- patient still with tenderness and significant soft tissue restriction. Patient reportin pain which disippated after easing off. Reports STM seems to be helping her symptoms. Tolerated periscapular  and RTC strengthening ther-ex with good effort, however consisted verbal and tactile cues required to correct patient's form with poor carryover. Patient also limited by fatigue this  session, also with report of feeling hot. Offered patient water and sitting rest break in between exercises. Added addition HEP exercises this date and administered handout. Patient reported understanding. Patient without c/o pain at end of session.    PT Treatment/Interventions  ADLs/Self Care Home Management;Cryotherapy;Moist Heat;Traction;Gait training;Stair training;Functional mobility training;Therapeutic activities;Therapeutic exercise;Manual techniques;Patient/family education;Neuromuscular re-education;Balance training;Passive range of motion;Dry needling;Energy conservation;Splinting;Taping    Consulted and Agree with Plan of Care  Patient       Patient will benefit from skilled therapeutic intervention in order to improve the following deficits and impairments:  Decreased activity tolerance, Decreased strength, Impaired UE functional use, Pain, Difficulty walking, Decreased mobility, Decreased balance, Decreased range of motion, Postural dysfunction, Impaired flexibility, Hypomobility  Visit Diagnosis: Acute pain of right shoulder  Stiffness of right shoulder, not elsewhere classified  Pain in thoracic spine  Unsteadiness on feet     Problem List Patient Active Problem List   Diagnosis Date Noted  . 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 Pain in thoracic spine 03/18/2018  . Thoracic back pain 02/15/2018  . Bronchitis 02/15/2018  . 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 Pneumonia 11/11/2017  . Cystitis 10/25/2017  . C. difficile colitis 09/15/2017  . Hypokalemia 08/20/2017  . Anemia 08/20/2017  . Elevated sed rate 08/15/2017  . 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 Epigastric pain 03/22/2017  . Myalgia 12/31/2016  . 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 SOB (shortness of breath) 07/26/2016  . Abnormal SPEP 07/26/2016  . Low back pain 07/14/2016  . Insomnia 12/29/2015  . Tension headache 10/21/2015  .  Muscle spasms of neck 10/18/2015  . Dysuria 10/13/2015  . Sinusitis 07/07/2015  . AP (abdominal pain) 07/07/2015  . Cardiomyopathy ([place]) 04/19/2015  . Chest wall pain 04/19/2015  . Obstructive sleep apnea 03/31/2015  . Congestive heart failure ([place]) 02/24/2015  . Hyperlipidemia   . Hypertension   . Depression   . Stroke ([place])   . GERD (gastroesophageal reflux disease)   . Bowel obstruction ([place]) 11/09/2014    Janene Harvey, PT, DPT 05/25/18 6:25 PM   [facility] [address]  [place], [place], [address] Phone: [phone]   Fax:  [phone]  Name: Tami [name] MRN: [id number] Date of Birth: 1956/06/17

## 2018-05-30 ENCOUNTER — Ambulatory Visit: Payer: Medicare Other | Admitting: Physical Therapy

## 2018-05-30 ENCOUNTER — Encounter: Payer: Self-pay | Admitting: Physical Therapy

## 2018-05-30 DIAGNOSIS — M546 Pain in thoracic spine: Secondary | ICD-10-CM | POA: Diagnosis not present

## 2018-05-30 DIAGNOSIS — R2681 Unsteadiness on feet: Secondary | ICD-10-CM | POA: Diagnosis not present

## 2018-05-30 DIAGNOSIS — M25611 Stiffness of right shoulder, not elsewhere classified: Secondary | ICD-10-CM

## 2018-05-30 DIAGNOSIS — M25511 Pain in right shoulder: Secondary | ICD-10-CM

## 2018-05-30 NOTE — Therapy (Addendum)
[facility] [address]  [place], [place], [address] Phone: [phone]   Fax:  [phone]  Physical Therapy Treatment  Patient Details  Name: Tami [name] MRN: [id number] Date of Birth: 10-31-1956 Referring Provider: Penni Homans, MD   Encounter Date: 05/30/2018  PT End of Session - 05/30/18 1822    Visit Number  4    Number of Visits  13    Date for PT Re-Evaluation  06/09/18    Authorization Type  [employer]HC Medicare & Medicaid    PT Start Time  1614    PT Stop Time  1703 ice pack    PT Time Calculation (min)  49 min    Activity Tolerance  Patient tolerated treatment well    Behavior During Therapy  [facility] for tasks assessed/performed       Past Medical History:  Diagnosis Date  . Abnormal SPEP 07/26/2016  . Arthritis    knees, hands  . Back pain 07/14/2016  . Bowel obstruction ([place]) 11/2014  . C. difficile diarrhea   . CHF (congestive heart failure) ([place])   . Congestive heart failure ([place]) 02/24/2015   S/p pacemaker  . Depression   . Elevated sed rate 08/15/2017  . Epigastric pain 03/22/2017  . GERD (gastroesophageal reflux disease)   . Hyperlipidemia   . Hypertension   . 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 Low back pain 07/14/2016  . 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 Myalgia 12/31/2016  . Obstructive sleep apnea 03/31/2015  . SOB (shortness of breath) 04/09/2016  . Stroke [facility])    TIAs    Past Surgical History:  Procedure Laterality Date  . ABDOMINAL HYSTERECTOMY     menorraghia, 2006, total  . CHOLECYSTECTOMY    . KNEE SURGERY     right, repair torn torn cartialage  . PACEMAKER INSERTION  08/2014  . TONSILLECTOMY    . TUBAL LIGATION      There were no vitals filed for this visit.  Subjective Assessment - 05/30/18 1616    Subjective  Reports more pain today due to the weather. Reports overall she is getting better.    Pertinent History  back pain, CHF,  pacemaker, epigastric pain, GERD, HLD, HTN, myalgia, stroke, R knee surgery    Diagnostic tests  02/22/18 xray thoracic: No acute bony abnormality.  Rightward scoliosis. 02/05/18 CT lumbar: Unchanged lumbar disc and facet degeneration, most notable at L4-5 where there is grade 1 anterolisthesis and mild-to-moderate neural foraminal stenosis.    Patient Stated Goals  "get it better if i can"    Currently in Pain?  Yes    Pain Score  5     Pain Location  Back    Pain Orientation  Right;Mid    Pain Descriptors / Indicators  Aching    Pain Type  Chronic pain                       [facility] Adult PT Treatment/Exercise - 05/30/18 0001      Lumbar Exercises: Aerobic   Nustep  L4x42min      Lumbar Exercises: Quadruped   Madcat/Old Horse  10 reps    Madcat/Old Horse Limitations  VCs for reciprocal breathing pattern      Shoulder Exercises: Prone   Other Prone Exercises  prone I, T, Y on R UE leaning over green pball; 10x each    Other Prone Exercises  R UE prone row 15x  4#; R UE prone extension 2# 15x heavy cues for form and speed      Shoulder Exercises: Standing   Horizontal ABduction  Strengthening;Both;Theraband;15 reps    Theraband Level (Shoulder Horizontal ABduction)  Level 2 (Red)    Horizontal ABduction Limitations  VCs to keep arms at 90 deg    External Rotation  Strengthening;Both;15 reps;Limitations;Theraband    Theraband Level (Shoulder External Rotation)  Level 2 (Red)    External Rotation Limitations  cues to keep B elbows in    Row  Strengthening;Both;Theraband;15 reps    Theraband Level (Shoulder Row)  Level 3 (Green) VCs for elbows at 90    Other Standing Exercises  R UE pec stretch at 90/90 doorway; 2x20sec      Shoulder Exercises: ROM/Strengthening   Cybex Row  10 reps;Limitations    Cybex Row Limitations  15# wide grip    Other ROM/Strengthening Exercises  B UE tricep pulldown 10x 10# VCs to straighten elbows    Other ROM/Strengthening Exercises  B UE  BATCA pallof press 10x 5#      Modalities   Modalities  Cryotherapy      Cryotherapy   Number Minutes Cryotherapy  10 Minutes    Cryotherapy Location  Shoulder R    Type of Cryotherapy  Ice pack               PT Short Term Goals - 05/23/18 1659      PT SHORT TERM GOAL #1   Title  Patient to be independent with initial HEP.    Time  3    Period  Weeks    Status  Achieved        PT Long Term Goals - 04/28/18 1529      PT LONG TERM GOAL #1   Title  Patient to be independent with advanced HEP.    Time  6    Period  Weeks    Status  New    Target Date  06/09/18      PT LONG TERM GOAL #2   Title  Patient to demonstrate [facility] R UE AROM without pain limiting.     Time  6    Period  Weeks    Status  New    Target Date  06/09/18      PT LONG TERM GOAL #3   Title  Patient to demonstrate R UE strength >=4+/5    Time  6    Period  Weeks    Status  New    Target Date  06/09/18      PT LONG TERM GOAL #4   Title  Patient to tolerate overhead reaching with R UE without pain limiting.     Time  6    Period  Weeks    Status  New    Target Date  06/09/18      PT LONG TERM GOAL #5   Title  Patient to demonstrate reciprocal stair climbing up/down 13 steps without handrail with good eccentric control and no evidence of instability.     Time  6    Period  Weeks    Status  New    Target Date  06/09/18      Additional Long Term Goals   Additional Long Term Goals  Yes      PT LONG TERM GOAL #6   Title  Patient to report sleeping 6 hours without c/o thoracic pain.    Time  6  Period  Weeks    Status  New    Target Date  06/09/18            Plan - 05/30/18 1825    Clinical Impression Statement  Patient arrived to session with no new complaints. Reports mildly flared up in R shoulder d/t the weather. Able to perform periscapular and RTC strengthening exercises this session with heavy VC/TCs to correct form, decrease speed, and improve motor control. Patient  with poor carryover of instruction of exercises. Patient reporting R deltoid pain at end of session. Reports this pain occurs intermittently with daily tasks at home and describes it as a "catching." Denies that today's exercises flared up her pain. Received ice to R shoulder at end of session for pain and inflammation relief. Patient with normal integumentary response and report of improvement in symptoms.     Clinical Impairments Affecting Rehab Potential  pacemaker, CHF, GERD, HLD, HTN, stroke, R knee surgery    PT Treatment/Interventions  ADLs/Self Care Home Management;Cryotherapy;Moist Heat;Traction;Gait training;Stair training;Functional mobility training;Therapeutic activities;Therapeutic exercise;Manual techniques;Patient/family education;Neuromuscular re-education;Balance training;Passive range of motion;Dry needling;Energy conservation;Splinting;Taping    Consulted and Agree with Plan of Care  Patient       Patient will benefit from skilled therapeutic intervention in order to improve the following deficits and impairments:  Decreased activity tolerance, Decreased strength, Impaired UE functional use, Pain, Difficulty walking, Decreased mobility, Decreased balance, Decreased range of motion, Postural dysfunction, Impaired flexibility, Hypomobility  Visit Diagnosis: Acute pain of right shoulder  Stiffness of right shoulder, not elsewhere classified  Pain in thoracic spine  Unsteadiness on feet     Problem List Patient Active Problem List   Diagnosis Date Noted  . 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 Pain in thoracic spine 03/18/2018  . Thoracic back pain 02/15/2018  . Bronchitis 02/15/2018  . 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 Pneumonia 11/11/2017  . Cystitis 10/25/2017  . C. difficile colitis 09/15/2017  . Hypokalemia 08/20/2017  . Anemia 08/20/2017  . Elevated sed rate 08/15/2017  . 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 Epigastric  pain 03/22/2017  . Myalgia 12/31/2016  . 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 SOB (shortness of breath) 07/26/2016  . Abnormal SPEP 07/26/2016  . Low back pain 07/14/2016  . Insomnia 12/29/2015  . Tension headache 10/21/2015  . Muscle spasms of neck 10/18/2015  . Dysuria 10/13/2015  . Sinusitis 07/07/2015  . AP (abdominal pain) 07/07/2015  . Cardiomyopathy ([place]) 04/19/2015  . Chest wall pain 04/19/2015  . Obstructive sleep apnea 03/31/2015  . Congestive heart failure ([place]) 02/24/2015  . Hyperlipidemia   . Hypertension   . Depression   . Stroke ([place])   . GERD (gastroesophageal reflux disease)   . Bowel obstruction ([place]) 11/09/2014    Janene Harvey, PT, DPT 05/30/18 6:29 PM  [facility] [address]  [place], [place], [address] Phone: [phone]   Fax:  [phone]  Name: Tami [name] MRN: [id number] Date of Birth: 02-Jul-1956

## 2018-06-01 ENCOUNTER — Ambulatory Visit: Payer: Medicare Other | Admitting: Physical Therapy

## 2018-06-01 ENCOUNTER — Encounter: Payer: Self-pay | Admitting: Physical Therapy

## 2018-06-01 DIAGNOSIS — M25611 Stiffness of right shoulder, not elsewhere classified: Secondary | ICD-10-CM | POA: Diagnosis not present

## 2018-06-01 DIAGNOSIS — M25511 Pain in right shoulder: Secondary | ICD-10-CM

## 2018-06-01 DIAGNOSIS — M546 Pain in thoracic spine: Secondary | ICD-10-CM

## 2018-06-01 DIAGNOSIS — R2681 Unsteadiness on feet: Secondary | ICD-10-CM

## 2018-06-01 NOTE — Therapy (Signed)
[facility] [address]  [place], [place], [address] Phone: [phone]   Fax:  [phone]  Physical Therapy Treatment  Patient Details  Name: Tami [name] MRN: [id number] Date of Birth: Dec 05, 1955 Referring Provider: Penni Homans, MD   Encounter Date: 06/01/2018  PT End of Session - 06/01/18 1740    Visit Number  5    Number of Visits  13    Date for PT Re-Evaluation  06/09/18    Authorization Type  UHC Medicare & Medicaid    PT Start Time  1700    PT Stop Time  1750    PT Time Calculation (min)  50 min    Activity Tolerance  Patient tolerated treatment well    Behavior During Therapy  [facility] for tasks assessed/performed       Past Medical History:  Diagnosis Date  . Abnormal SPEP 07/26/2016  . Arthritis    knees, hands  . Back pain 07/14/2016  . Bowel obstruction ([place]) 11/2014  . C. difficile diarrhea   . CHF (congestive heart failure) ([place])   . Congestive heart failure ([place]) 02/24/2015   S/p pacemaker  . Depression   . Elevated sed rate 08/15/2017  . Epigastric pain 03/22/2017  . GERD (gastroesophageal reflux disease)   . Hyperlipidemia   . Hypertension   . 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 Low back pain 07/14/2016  . 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 Myalgia 12/31/2016  . Obstructive sleep apnea 03/31/2015  . SOB (shortness of breath) 04/09/2016  . Stroke [facility])    TIAs    Past Surgical History:  Procedure Laterality Date  . ABDOMINAL HYSTERECTOMY     menorraghia, 2006, total  . CHOLECYSTECTOMY    . KNEE SURGERY     right, repair torn torn cartialage  . PACEMAKER INSERTION  08/2014  . TONSILLECTOMY    . TUBAL LIGATION      There were no vitals filed for this visit.  Subjective Assessment - 06/01/18 1702    Subjective  Reports R shoulder catching persisting. Says her MD wants to xray it. Called her MD yesterday.    Pertinent History  back pain, CHF,  pacemaker, epigastric pain, GERD, HLD, HTN, myalgia, stroke, R knee surgery    Diagnostic tests  02/22/18 xray thoracic: No acute bony abnormality.  Rightward scoliosis. 02/05/18 CT lumbar: Unchanged lumbar disc and facet degeneration, most notable at L4-5 where there is grade 1 anterolisthesis and mild-to-moderate neural foraminal stenosis.    Patient Stated Goals  "get it better if i can"    Currently in Pain?  Yes    Pain Score  8     Pain Location  Shoulder    Pain Orientation  Right    Pain Descriptors / Indicators  Aching    Pain Type  Acute pain                       [facility] Adult PT Treatment/Exercise - 06/01/18 0001      Lumbar Exercises: Aerobic   Nustep  L3x 6 min      Shoulder Exercises: Prone   Other Prone Exercises  R UE prone row 2x10 3#      Shoulder Exercises: Standing   External Rotation  Strengthening;Right    Theraband Level (Shoulder External Rotation)  Level 2 (Red)    External Rotation Limitations  heavy VCs for form and to control eccentrically; dowel under elbow;  Internal Rotation  Strengthening;Right;10 reps;Theraband;Limitations    Theraband Level (Shoulder Internal Rotation)  Level 2 (Red)    Internal Rotation Limitations  heavy VCs for form and to control eccentrically; dowel under elbow;     Extension  Strengthening;Both;Theraband;Limitations;15 reps    Theraband Level (Shoulder Extension)  Level 2 (Red)    Extension Limitations  B UE shoulder extension pulldowns with red TB 15x    Row  Strengthening;Both;Theraband;15 reps;Limitations    Theraband Level (Shoulder Row)  Level 2 (Red)    Row Limitations  VCs to keep elbows by side    Diagonals  Strengthening;Right;10 reps;Theraband;Limitations    Theraband Level (Shoulder Diagonals)  Level 1 ([place]ellow) VCs to control eccentric lower and for UE placement    Other Standing Exercises  R UE pec stretch at 90/90 doorway; 2x20sec    Other Standing Exercises  --      Cryotherapy   Number  Minutes Cryotherapy  10 Minutes    Cryotherapy Location  Shoulder R    Type of Cryotherapy  Ice pack      Manual Therapy   Manual Therapy  Soft tissue mobilization    Soft tissue mobilization  R deltoid, supraspinatus, UT, infraspinatus, rhomboid, thoracic paraspinals; soft tissue restriction in infraspinatus and rhomboid; TTP in rhomboid, UT, infraspinatus               PT Short Term Goals - 05/23/18 1659      PT SHORT TERM GOAL #1   Title  Patient to be independent with initial HEP.    Time  3    Period  Weeks    Status  Achieved        PT Long Term Goals - 04/28/18 1529      PT LONG TERM GOAL #1   Title  Patient to be independent with advanced HEP.    Time  6    Period  Weeks    Status  New    Target Date  06/09/18      PT LONG TERM GOAL #2   Title  Patient to demonstrate [facility] R UE AROM without pain limiting.     Time  6    Period  Weeks    Status  New    Target Date  06/09/18      PT LONG TERM GOAL #3   Title  Patient to demonstrate R UE strength >=4+/5    Time  6    Period  Weeks    Status  New    Target Date  06/09/18      PT LONG TERM GOAL #4   Title  Patient to tolerate overhead reaching with R UE without pain limiting.     Time  6    Period  Weeks    Status  New    Target Date  06/09/18      PT LONG TERM GOAL #5   Title  Patient to demonstrate reciprocal stair climbing up/down 13 steps without handrail with good eccentric control and no evidence of instability.     Time  6    Period  Weeks    Status  New    Target Date  06/09/18      Additional Long Term Goals   Additional Long Term Goals  Yes      PT LONG TERM GOAL #6   Title  Patient to report sleeping 6 hours without c/o thoracic pain.    Time  6  Period  Weeks    Status  New    Target Date  06/09/18            Plan - 06/01/18 1740    Clinical Impression Statement  Patient arrived to session with persisted R shoulder pain. Reports she contacted her MD about getting an  x-ray. Denies that PT session earlier this week increased shoulder pain. Focused on STM this session in order to ease pain levels. Patient with soft tissue restriction in infraspinatus and rhomboid; TTP in rhomboid, UT, infraspinatus. Notes good benefit from [facility] this date. Patient tolerated all periscapular and RTC strengthening exercises without c/o pain. VC/TCs required throughout to correct form as patient with poor carryover of exercises. Received ice pack to R shoulder at end of session as patient reports benefit from ice. Normal integumentary response noted, patient still with mild R shoulder pain at end of session.     PT Treatment/Interventions  ADLs/Self Care Home Management;Cryotherapy;Moist Heat;Traction;Gait training;Stair training;Functional mobility training;Therapeutic activities;Therapeutic exercise;Manual techniques;Patient/family education;Neuromuscular re-education;Balance training;Passive range of motion;Dry needling;Energy conservation;Splinting;Taping    PT Next Visit Plan  reassess R shoulder pain levels    Consulted and Agree with Plan of Care  Patient       Patient will benefit from skilled therapeutic intervention in order to improve the following deficits and impairments:  Decreased activity tolerance, Decreased strength, Impaired UE functional use, Pain, Difficulty walking, Decreased mobility, Decreased balance, Decreased range of motion, Postural dysfunction, Impaired flexibility, Hypomobility  Visit Diagnosis: Acute pain of right shoulder  Stiffness of right shoulder, not elsewhere classified  Pain in thoracic spine  Unsteadiness on feet     Problem List Patient Active Problem List   Diagnosis Date Noted  . 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 Pain in thoracic spine 03/18/2018  . Thoracic back pain 02/15/2018  . Bronchitis 02/15/2018  . 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 Pneumonia 11/11/2017  . Cystitis 10/25/2017  . C. difficile colitis  09/15/2017  . Hypokalemia 08/20/2017  . Anemia 08/20/2017  . Elevated sed rate 08/15/2017  . 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 Epigastric pain 03/22/2017  . Myalgia 12/31/2016  . 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 SOB (shortness of breath) 07/26/2016  . Abnormal SPEP 07/26/2016  . Low back pain 07/14/2016  . Insomnia 12/29/2015  . Tension headache 10/21/2015  . Muscle spasms of neck 10/18/2015  . Dysuria 10/13/2015  . Sinusitis 07/07/2015  . AP (abdominal pain) 07/07/2015  . Cardiomyopathy ([place]) 04/19/2015  . Chest wall pain 04/19/2015  . Obstructive sleep apnea 03/31/2015  . Congestive heart failure ([place]) 02/24/2015  . Hyperlipidemia   . Hypertension   . Depression   . Stroke ([place])   . GERD (gastroesophageal reflux disease)   . Bowel obstruction ([place]) 11/09/2014    Janene Harvey, PT, DPT 06/01/18 5:56 PM   [facility] [address]  [place], [place], [address] Phone: [phone]   Fax:  [phone]  Name: Tami [name] MRN: [id number] Date of Birth: 1956-02-07

## 2018-06-06 ENCOUNTER — Ambulatory Visit: Payer: Medicare Other | Admitting: Physical Therapy

## 2018-06-07 ENCOUNTER — Ambulatory Visit (HOSPITAL_BASED_OUTPATIENT_CLINIC_OR_DEPARTMENT_OTHER)
Admission: RE | Admit: 2018-06-07 | Discharge: 2018-06-07 | Disposition: A | Discharge status: Home / Self Care | Payer: Medicare Other | Source: Ambulatory Visit | Attending: Family Medicine | Admitting: Family Medicine

## 2018-06-07 ENCOUNTER — Telehealth: Payer: Self-pay | Admitting: *Deleted

## 2018-06-07 ENCOUNTER — Encounter: Payer: Self-pay | Admitting: Family Medicine

## 2018-06-07 ENCOUNTER — Ambulatory Visit (INDEPENDENT_AMBULATORY_CARE_PROVIDER_SITE_OTHER): Payer: Medicare Other | Admitting: Family Medicine

## 2018-06-07 VITALS — BP 142/96 | HR 90 | Temp 98.0°F | Resp 16 | Ht 67.0 in | Wt 179.8 lb

## 2018-06-07 DIAGNOSIS — M542 Cervicalgia: Secondary | ICD-10-CM

## 2018-06-07 DIAGNOSIS — M47896 Other spondylosis, lumbar region: Secondary | ICD-10-CM | POA: Diagnosis not present

## 2018-06-07 DIAGNOSIS — M85621 Other cyst of bone, right upper arm: Secondary | ICD-10-CM | POA: Diagnosis not present

## 2018-06-07 DIAGNOSIS — M48061 Spinal stenosis, lumbar region without neurogenic claudication: Secondary | ICD-10-CM | POA: Diagnosis not present

## 2018-06-07 DIAGNOSIS — G43909 Migraine, unspecified, not intractable, without status migrainosus: Secondary | ICD-10-CM | POA: Diagnosis not present

## 2018-06-07 DIAGNOSIS — R202 Paresthesia of skin: Secondary | ICD-10-CM | POA: Diagnosis not present

## 2018-06-07 DIAGNOSIS — I10 Essential (primary) hypertension: Secondary | ICD-10-CM | POA: Diagnosis not present

## 2018-06-07 DIAGNOSIS — R42 Dizziness and giddiness: Secondary | ICD-10-CM | POA: Diagnosis not present

## 2018-06-07 DIAGNOSIS — M4802 Spinal stenosis, cervical region: Secondary | ICD-10-CM | POA: Insufficient documentation

## 2018-06-07 DIAGNOSIS — M5441 Lumbago with sciatica, right side: Secondary | ICD-10-CM | POA: Diagnosis not present

## 2018-06-07 DIAGNOSIS — M19011 Primary osteoarthritis, right shoulder: Secondary | ICD-10-CM | POA: Diagnosis not present

## 2018-06-07 DIAGNOSIS — M24811 Other specific joint derangements of right shoulder, not elsewhere classified: Secondary | ICD-10-CM | POA: Insufficient documentation

## 2018-06-07 DIAGNOSIS — M25511 Pain in right shoulder: Secondary | ICD-10-CM | POA: Insufficient documentation

## 2018-06-07 MED ORDER — POTASSIUM CHLORIDE CRYS ER 20 MEQ PO TBCR: 20.0000 meq | EXTENDED_RELEASE_TABLET | Freq: Every day | ORAL | 0 refills | Status: DC

## 2018-06-07 MED ORDER — TOPIRAMATE 25 MG PO TABS: 25.0000 mg | ORAL_TABLET | Freq: Two times a day (BID) | ORAL | 0 refills | Status: DC

## 2018-06-07 MED ORDER — LIDOCAINE 5 % EX PTCH: 1.0000 | MEDICATED_PATCH | CUTANEOUS | 2 refills | Status: DC

## 2018-06-07 NOTE — Progress Notes (Signed)
   Subjective:    [name], female    DOB: 1956/02/22, 62 y.o.   MRN: [id number]  HPI Patient presents to clinic with c/o right shoulder pain, neck pain and low back pain. She saw ortho 03/2018 for thoracic spine pain, xray was done of T spine and she was given order for PT. She has been going to PT and began to tell her PT about right shoulder pain, neck pain and low back pain with intermittent numbness/tinging in right arm and right leg.  She uses tylenol for pain with minimal relief. She avoids NSAIDs due to cardiac history.   Denies any injury to shoulder, neck or low back.   Denies any recent falls.   Review of Systems  Constitutional: Negative for chills, fever and unexpected weight change.  HENT: Negative.   Respiratory: Negative for cough, chest tightness, shortness of breath and wheezing.   Cardiovascular: Negative for chest pain, palpitations and leg swelling.  Gastrointestinal: Negative.   Genitourinary: Negative.   Musculoskeletal: Positive for arthralgias, back pain and neck pain.       Right shoulder pain  Skin: Negative for color change, pallor and rash.  Neurological: Negative for dizziness and headaches.       Intermittent numbness & tingling in right arm and right leg.        Objective:   Physical Exam  Constitutional: She is oriented to person, place, and time. She appears well-developed. No distress.  HENT:  Head: Normocephalic and atraumatic.  Eyes: Conjunctivae and EOM are normal. No scleral icterus.  Neck: Normal range of motion. Neck supple. No tracheal deviation present.  Cardiovascular: Normal rate and regular rhythm.  Pacemaker left upper chest  Pulmonary/Chest: Effort normal and breath sounds normal. No respiratory distress. She has no wheezes. She has no rales.  Musculoskeletal: She exhibits no deformity.  ROM neck normal. ROM Low back normal. Gait normal. Pain with raising right arm straight up above head, across the chest, forward,  backward and out to the side. Is able to hold out right arm with own muscle strength.   Neurological: She is alert and oriented to person, place, and time. No cranial nerve deficit. She exhibits normal muscle tone. Coordination normal.  Grips equal and strong. Quadricep strength equal and strong.   Skin: Skin is warm. Capillary refill takes less than 2 seconds. She is not diaphoretic. No pallor.  Psychiatric: She has a normal mood and affect. Her behavior is normal.  Nursing note and vitals reviewed.  Vitals:   06/07/18 1156 06/07/18 1231  BP: (!) 140/100 (!) 142/96  Pulse: 90   Resp: 16   Temp: 98 F (36.7 C)   SpO2: 96%     Assessment & Plan:  Right shoulder pain -- xray ordered. Lidocaine topical patch to help pain. Can continue tylenol  Neck pain -- xray ordered  Low back pain -- xray ordered  Paresthesias -- suspect possible relation to neck/back issues. xrays will help further evaluate  HTN - BP recheck shows it still to be elevated. She is taking her BP meds every day. Advised to continue meds and monitor BP at home.   She has been advised to finish her current PT sessions. She will schedule her annual medicare wellness appt and also a follow up in approx 4 weeks after PT is done and xrays are complete.

## 2018-06-07 NOTE — Telephone Encounter (Signed)
Pt seen in office today. Requests refills  On: ASA, carvedilol, potassium, topiramate and furosemide.  Per [facility], they received these RX in April and they were transferred to [facility]. They did not receive Rxs for topiramate or Potassium. Spoke with Lovena Le at [facility] and verified that they had all prescriptions except potassium and topiramate. Rxs sent and they will fill all the other prescriptions. Pt requests to remove [facility] as primary pharmacy and list [facility] as she is interested in having her medications delivered to her. Pharmacy list updated.

## 2018-06-07 NOTE — Patient Instructions (Signed)
Wonderful to meet you BP little up today, monitor at home and follow up with Dr Charlett Blake Continue PT sessions as you have been XRAY orders for right shoulder, neck and lumbar spine - please have these done Lidocaine topical patch to help shoulder pain. May continue tylenol as needed for pain Avoid NSAIDs like motrin or advil due to your BP and cardiac history.

## 2018-06-08 ENCOUNTER — Telehealth: Payer: Self-pay

## 2018-06-08 ENCOUNTER — Ambulatory Visit: Payer: Medicare Other

## 2018-06-08 DIAGNOSIS — M546 Pain in thoracic spine: Secondary | ICD-10-CM

## 2018-06-08 DIAGNOSIS — M25511 Pain in right shoulder: Secondary | ICD-10-CM | POA: Diagnosis not present

## 2018-06-08 DIAGNOSIS — R2681 Unsteadiness on feet: Secondary | ICD-10-CM

## 2018-06-08 DIAGNOSIS — M25611 Stiffness of right shoulder, not elsewhere classified: Secondary | ICD-10-CM

## 2018-06-08 MED ORDER — PREDNISONE 5 MG (21) PO TBPK: ORAL_TABLET | ORAL | 0 refills | Status: DC

## 2018-06-08 MED ORDER — MENTHOL (TOPICAL ANALGESIC) 5 % EX PTCH: 1.0000 | MEDICATED_PATCH | Freq: Every day | CUTANEOUS | 1 refills | Status: DC

## 2018-06-08 NOTE — Therapy (Addendum)
[facility] [address]  [place], [place], [address] Phone: [phone]   Fax:  [phone]  Physical Therapy Treatment  Patient Details  Name: Benjamin Merrihew MRN: [id number] Date of Birth: 1956-02-11 Referring Provider: Penni Homans, MD   Progress Note Reporting Period 04/28/18 to 06/08/18  See note below for Objective Data and Assessment of Progress/Goals.    Encounter Date: 06/08/2018  PT End of Session - 06/08/18 1709    Visit Number  6    Number of Visits  10    Date for PT Re-Evaluation  07/06/18    Authorization Type  [employer] Medicare & Medicaid    PT Start Time  1703    PT Stop Time  1748    PT Time Calculation (min)  45 min    Activity Tolerance  Patient tolerated treatment well    Behavior During Therapy  WFL for tasks assessed/performed       Past Medical History:  Diagnosis Date  . Abnormal SPEP 07/26/2016  . Arthritis    knees, hands  . Back pain 07/14/2016  . Bowel obstruction ([place]) 11/2014  . C. difficile diarrhea   . CHF (congestive heart failure) ([place])   . Congestive heart failure ([place]) 02/24/2015   S/p pacemaker  . Depression   . Elevated sed rate 08/15/2017  . Epigastric pain 03/22/2017  . GERD (gastroesophageal reflux disease)   . Hyperlipidemia   . Hypertension   . 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 Low back pain 07/14/2016  . 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 Myalgia 12/31/2016  . Obstructive sleep apnea 03/31/2015  . SOB (shortness of breath) 04/09/2016  . Stroke [facility])    TIAs    Past Surgical History:  Procedure Laterality Date  . ABDOMINAL HYSTERECTOMY     menorraghia, 2006, total  . CHOLECYSTECTOMY    . KNEE SURGERY     right, repair torn torn cartialage  . PACEMAKER INSERTION  08/2014  . TONSILLECTOMY    . TUBAL LIGATION      There were no vitals filed for this visit.  Subjective Assessment - 06/08/18 1706    Subjective  Pt.  reporting R shoulder bothering her most now.      Pertinent History  back pain, CHF, pacemaker, epigastric pain, GERD, HLD, HTN, myalgia, stroke, R knee surgery    Diagnostic tests  02/22/18 xray thoracic: No acute bony abnormality.  Rightward scoliosis. 02/05/18 CT lumbar: Unchanged lumbar disc and facet degeneration, most notable at L4-5 where there is grade 1 anterolisthesis and mild-to-moderate neural foraminal stenosis.    Patient Stated Goals  "get it better if i can"    Currently in Pain?  Yes    Pain Score  10-Worst pain ever    Pain Location  Shoulder    Pain Orientation  Right    Pain Descriptors / Indicators  Aching    Pain Type  Acute pain    Pain Onset  More than a month ago    Pain Frequency  Constant    Aggravating Factors   Unable to describe, lots of movement     Multiple Pain Sites  Yes    Pain Score  8    Pain Location  Back    Pain Orientation  Right;Mid    Pain Descriptors / Indicators  Aching    Pain Type  Chronic pain    Pain Onset  More than a month ago  Pain Frequency  Constant    Aggravating Factors   unsure     Pain Relieving Factors  ice          [facility] PT Assessment - 06/08/18 1718      AROM   Right Shoulder Flexion  158 Degrees    Right Shoulder ABduction  160 Degrees    Right Shoulder Internal Rotation  -- FIR to bra strap    Right Shoulder External Rotation  -- FER to T6    Thoracic Flexion  to ankles - pain free    Thoracic Extension  [facility]    Thoracic - Right Side Bend  joint line    Thoracic - Left Side Bend  joint line    Thoracic - Right Rotation  WFL pain at R shoulder    Thoracic - Left Rotation  mildly limited pain at R shoulder       Strength   Strength Assessment Site  Hip;Knee;Ankle;Shoulder    Right/Left Shoulder  Right;Left    Right Shoulder Flexion  4+/5    Right Shoulder ABduction  4+/5    Right Shoulder Internal Rotation  4/5    Right Shoulder External Rotation  4-/5    Left Shoulder Flexion  4+/5    Left Shoulder  ABduction  4+/5    Left Shoulder Internal Rotation  4/5    Left Shoulder External Rotation  4-/5    Right/Left Hip  Right;Left    Right Hip Flexion  4-/5    Right Hip ABduction  4/5    Right Hip ADduction  4/5    Left Hip Flexion  4/5    Left Hip ABduction  4/5    Left Hip ADduction  4/5    Right/Left Knee  Right;Left    Right Knee Flexion  4+/5    Right Knee Extension  5/5    Left Knee Flexion  4/5    Left Knee Extension  4+/5    Right/Left Ankle  Right;Left    Right Ankle Dorsiflexion  4+/5    Right Ankle Plantar Flexion  4+/5    Left Ankle Dorsiflexion  4+/5    Left Ankle Plantar Flexion  4+/5                   [facility] Adult PT Treatment/Exercise - 06/08/18 1749      Lumbar Exercises: Aerobic   Nustep  L4x 8 min      Lumbar Exercises: Standing   Other Standing Lumbar Exercises  standing lumbar extensions x 10 reps       Shoulder Exercises: Standing   Extension  Strengthening;Both;Theraband;Limitations;10 reps    Theraband Level (Shoulder Extension)  Level 2 (Red)    Row  Strengthening;Both;Theraband;Limitations;10 reps    Theraband Level (Shoulder Row)  Level 3 Nyoka Cowden)               PT Short Term Goals - 05/23/18 1659      PT SHORT TERM GOAL #1   Title  Patient to be independent with initial HEP.    Time  3    Period  Weeks    Status  Achieved        PT Long Term Goals - 06/08/18 1711      PT LONG TERM GOAL #1   Title  Patient to be independent with advanced HEP.    Time  4    Period  Weeks    Status  Partially Met met for current  Target Date  07/06/18      PT LONG TERM GOAL #2   Title  Patient to demonstrate [facility] R UE AROM without pain limiting.     Time  4    Period  Weeks    Status  Partially Met Met for R shoulder AROM IR, ER, abduction -  mod pain which pt. notes she can tolerate     Target Date  07/06/18      PT LONG TERM GOAL #3   Title  Patient to demonstrate R UE strength >=4+/5    Time  4    Period  Weeks    Status   Partially Met    Target Date  07/06/18      PT LONG TERM GOAL #4   Title  Patient to tolerate overhead reaching with R UE without pain limiting.     Time  4    Period  Weeks    Status  On-going    Target Date  07/06/18      PT LONG TERM GOAL #5   Title  Patient to demonstrate reciprocal stair climbing up/down 13 steps without handrail with good eccentric control and no evidence of instability.     Time  4    Period  Weeks    Status  On-going Still ambulating step-to pattern, with hip ER and mod rail use and reporting L knee pain on descending     Target Date  07/06/18      PT LONG TERM GOAL #6   Title  Patient to report sleeping 6 hours without c/o thoracic pain.    Time  4    Period  Weeks    Status  On-going Pt. noting she can sleep for 4-5 hours before waking with back pain     Target Date  07/06/18            Plan - 06/08/18 1710    Clinical Impression Statement  Unsure if therapy has reduced her pain however feels her shoulder motion is better.  Had X-rays of neck, shoulder, and lumbar spine and wishes to review today.  Supervising PT and therapist reviewing findings with pt. revealing arthritic changes however no acute findings.  Able to demo good progress toward existing LTG's with therapy and pt. wishing to continue.  Pt. with remaining strength deficits in R shoulder, mild AROM deficits in flexion, and reporting ongoing sleep disturbance due to pain.  Pt. will continue to benefit from further skilled therapy to improve functional strength, ROM, and reduce pain with daily activities. Plan to continue PT 1x/week for 4 weeks. Patient in agreement.     Clinical Impairments Affecting Rehab Potential  pacemaker, CHF, GERD, HLD, HTN, stroke, R knee surgery    PT Treatment/Interventions  ADLs/Self Care Home Management;Cryotherapy;Moist Heat;Traction;Gait training;Stair training;Functional mobility training;Therapeutic activities;Therapeutic exercise;Manual  techniques;Patient/family education;Neuromuscular re-education;Balance training;Passive range of motion;Dry needling;Energy conservation;Splinting;Taping    Consulted and Agree with Plan of Care  Patient       Patient will benefit from skilled therapeutic intervention in order to improve the following deficits and impairments:  Decreased activity tolerance, Decreased strength, Impaired UE functional use, Pain, Difficulty walking, Decreased mobility, Decreased balance, Decreased range of motion, Postural dysfunction, Impaired flexibility, Hypomobility  Visit Diagnosis: Acute pain of right shoulder  Stiffness of right shoulder, not elsewhere classified  Pain in thoracic spine  Unsteadiness on feet     Problem List Patient Active Problem List   Diagnosis Date Noted  . Right  shoulder pain 06/07/2018  . Neck pain 06/07/2018  . Paresthesias 06/07/2018  . 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 Pain in thoracic spine 03/18/2018  . Thoracic back pain 02/15/2018  . Bronchitis 02/15/2018  . 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 Pneumonia 11/11/2017  . Cystitis 10/25/2017  . C. difficile colitis 09/15/2017  . Hypokalemia 08/20/2017  . Anemia 08/20/2017  . Elevated sed rate 08/15/2017  . 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 Epigastric pain 03/22/2017  . Myalgia 12/31/2016  . 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 SOB (shortness of breath) 07/26/2016  . Abnormal SPEP 07/26/2016  . Low back pain 07/14/2016  . Insomnia 12/29/2015  . Tension headache 10/21/2015  . Muscle spasms of neck 10/18/2015  . Dysuria 10/13/2015  . Sinusitis 07/07/2015  . AP (abdominal pain) 07/07/2015  . Cardiomyopathy ([place]) 04/19/2015  . Chest wall pain 04/19/2015  . Obstructive sleep apnea 03/31/2015  . Congestive heart failure ([place]) 02/24/2015  . Hyperlipidemia   . Hypertension   . Depression   . Stroke ([place])   . GERD (gastroesophageal  reflux disease)   . Bowel obstruction ([place]) 11/09/2014    Bess Harvest, PTA 06/08/18 7:06 PM  [facility] [address]  [place], [place], [address] Phone: [phone]   Fax:  [phone]  Name: Lenae Wherley MRN: [id number] Date of Birth: 1956/09/02  Janene Harvey, PT, DPT 06/08/18 7:06 PM  PHYSICAL THERAPY DISCHARGE SUMMARY  Visits from Start of Care: 6  Current functional level related to goals / functional outcomes: See above clinical impression; patient did not return since last session d/t other medical concerns   Remaining deficits: See above   Education / Equipment: HEP  Plan: Patient agrees to discharge.  Patient goals were not met. Patient is being discharged due to not returning since the last visit.  ?????     Janene Harvey, PT, DPT 07/13/18 9:21 AM

## 2018-06-08 NOTE — Telephone Encounter (Signed)
PA initiated via Covermymeds; KEY: [id number]. Awaiting determination.

## 2018-06-08 NOTE — Addendum Note (Signed)
Addended by: Philis Nettle on: 06/08/2018 08:00 AM   Modules accepted: Orders

## 2018-06-08 NOTE — Telephone Encounter (Signed)
PA denied. Shoulder pain and neck pain is not a "medically accepted indication" for this medication.

## 2018-06-08 NOTE — Telephone Encounter (Signed)
I can try to send Rx for bengay type patch - if this is not covered, I do believe bengay patches are available OTC

## 2018-06-13 DIAGNOSIS — Z4502 Encounter for adjustment and management of automatic implantable cardiac defibrillator: Secondary | ICD-10-CM | POA: Diagnosis not present

## 2018-06-15 DIAGNOSIS — Z79899 Other long term (current) drug therapy: Secondary | ICD-10-CM | POA: Diagnosis not present

## 2018-06-15 DIAGNOSIS — R7303 Prediabetes: Secondary | ICD-10-CM | POA: Diagnosis not present

## 2018-06-15 DIAGNOSIS — M436 Torticollis: Secondary | ICD-10-CM | POA: Diagnosis not present

## 2018-06-15 DIAGNOSIS — I10 Essential (primary) hypertension: Secondary | ICD-10-CM | POA: Diagnosis not present

## 2018-06-16 ENCOUNTER — Encounter

## 2018-06-16 DIAGNOSIS — M5030 Other cervical disc degeneration, unspecified cervical region: Secondary | ICD-10-CM | POA: Diagnosis not present

## 2018-06-16 DIAGNOSIS — M542 Cervicalgia: Secondary | ICD-10-CM | POA: Diagnosis not present

## 2018-06-20 ENCOUNTER — Ambulatory Visit: Payer: Medicare Other

## 2018-06-20 ENCOUNTER — Ambulatory Visit: Payer: Medicare Other | Admitting: Family Medicine

## 2018-06-20 DIAGNOSIS — R1030 Lower abdominal pain, unspecified: Secondary | ICD-10-CM | POA: Diagnosis not present

## 2018-06-20 DIAGNOSIS — R531 Weakness: Secondary | ICD-10-CM | POA: Diagnosis not present

## 2018-06-20 DIAGNOSIS — R112 Nausea with vomiting, unspecified: Secondary | ICD-10-CM | POA: Diagnosis not present

## 2018-06-20 DIAGNOSIS — R51 Headache: Secondary | ICD-10-CM | POA: Diagnosis not present

## 2018-06-20 DIAGNOSIS — R197 Diarrhea, unspecified: Secondary | ICD-10-CM | POA: Diagnosis not present

## 2018-06-23 ENCOUNTER — Ambulatory Visit (INDEPENDENT_AMBULATORY_CARE_PROVIDER_SITE_OTHER): Payer: Medicare Other | Admitting: Family Medicine

## 2018-06-23 ENCOUNTER — Encounter: Payer: Self-pay | Admitting: Family Medicine

## 2018-06-23 VITALS — BP 118/84 | HR 91 | Temp 98.0°F | Ht 64.0 in | Wt 174.0 lb

## 2018-06-23 DIAGNOSIS — H6983 Other specified disorders of Eustachian tube, bilateral: Secondary | ICD-10-CM | POA: Diagnosis not present

## 2018-06-23 DIAGNOSIS — G44209 Tension-type headache, unspecified, not intractable: Secondary | ICD-10-CM

## 2018-06-23 DIAGNOSIS — J302 Other seasonal allergic rhinitis: Secondary | ICD-10-CM | POA: Diagnosis not present

## 2018-06-23 MED ORDER — LEVOCETIRIZINE DIHYDROCHLORIDE 5 MG PO TABS
5.0000 mg | ORAL_TABLET | Freq: Every evening | ORAL | 2 refills | Status: DC
Start: 2018-06-23 — End: 2019-07-13

## 2018-06-23 MED ORDER — METHYLPREDNISOLONE ACETATE 80 MG/ML IJ SUSP
80.0000 mg | Freq: Once | INTRAMUSCULAR | Status: AC
  Administered 2018-06-23: 80 mg via INTRAMUSCULAR

## 2018-06-23 MED ORDER — PREDNISONE 20 MG PO TABS: 40.0000 mg | ORAL_TABLET | Freq: Every day | ORAL | 0 refills | Status: AC

## 2018-06-23 NOTE — Patient Instructions (Signed)
Start the prednisone tomorrow!  Claritin (loratadine), Allegra (fexofenadine), Zyrtec (cetirizine); these are listed in order from weakest to strongest. Generic, and therefore cheaper, options are in the parentheses.   Flonase (fluticasone); nasal spray that is over the counter. 2 sprays each nostril, once daily. Aim towards the same side eye when you spray.  There are available OTC, and the generic versions, which may be cheaper, are in parentheses. Show this to a pharmacist if you have trouble finding any of these items.  Heat (pad or rice pillow in microwave) over affected area, 10-15 minutes twice daily.   Ice/cold pack over area for 10-15 min twice daily.  OK to take Tylenol 1000 mg (2 extra strength tabs) or 975 mg (3 regular strength tabs) every 6 hours as needed.  EXERCISES RANGE OF MOTION (ROM) AND STRETCHING EXERCISES  These exercises may help you when beginning to rehabilitate your issue. In order to successfully resolve your symptoms, you must improve your posture. These exercises are designed to help reduce the forward-head and rounded-shoulder posture which contributes to this condition. Your symptoms may resolve with or without further involvement from your physician, physical therapist or athletic trainer. While completing these exercises, remember:   Restoring tissue flexibility helps normal motion to return to the joints. This allows healthier, less painful movement and activity.  An effective stretch should be held for at least 20 seconds, although you may need to begin with shorter hold times for comfort.  A stretch should never be painful. You should only feel a gentle lengthening or release in the stretched tissue.  Do not do any stretch or exercise that you cannot tolerate.  STRETCH- Axial Extensors  Lie on your back on the floor. You may bend your knees for comfort. Place a rolled-up hand towel or dish towel, about 2 inches in diameter, under the part of your head  that makes contact with the floor.  Gently tuck your chin, as if trying to make a "double chin," until you feel a gentle stretch at the base of your head.  Hold 15-20 seconds. Repeat 2-3 times. Complete this exercise 1 time per day.   STRETCH - Axial Extension   Stand or sit on a firm surface. Assume a good posture: chest up, shoulders drawn back, abdominal muscles slightly tense, knees unlocked (if standing) and feet hip width apart.  Slowly retract your chin so your head slides back and your chin slightly lowers. Continue to look straight ahead.  You should feel a gentle stretch in the back of your head. Be certain not to feel an aggressive stretch since this can cause headaches later.  Hold for 15-20 seconds. Repeat 2-3 times. Complete this exercise 1 time per day.  STRETCH - Cervical Side Bend   Stand or sit on a firm surface. Assume a good posture: chest up, shoulders drawn back, abdominal muscles slightly tense, knees unlocked (if standing) and feet hip width apart.  Without letting your nose or shoulders move, slowly tip your right / left ear to your shoulder until your feel a gentle stretch in the muscles on the opposite side of your neck.  Hold 15-20 seconds. Repeat 2-3 times. Complete this exercise 1-2 times per day.  STRETCH - Cervical Rotators   Stand or sit on a firm surface. Assume a good posture: chest up, shoulders drawn back, abdominal muscles slightly tense, knees unlocked (if standing) and feet hip width apart.  Keeping your eyes level with the ground, slowly turn your head until  you feel a gentle stretch along the back and opposite side of your neck.  Hold 15-20 seconds. Repeat 2-3 times. Complete this exercise 1-2 times per day.  RANGE OF MOTION - Neck Circles   Stand or sit on a firm surface. Assume a good posture: chest up, shoulders drawn back, abdominal muscles slightly tense, knees unlocked (if standing) and feet hip width apart.  Gently roll your  head down and around from the back of one shoulder to the back of the other. The motion should never be forced or painful.  Repeat the motion 10-20 times, or until you feel the neck muscles relax and loosen. Repeat 2-3 times. Complete the exercise 1-2 times per day. STRENGTHENING EXERCISES - Cervical Strain and Sprain These exercises may help you when beginning to rehabilitate your injury. They may resolve your symptoms with or without further involvement from your physician, physical therapist, or athletic trainer. While completing these exercises, remember:   Muscles can gain both the endurance and the strength needed for everyday activities through controlled exercises.  Complete these exercises as instructed by your physician, physical therapist, or athletic trainer. Progress the resistance and repetitions only as guided.  You may experience muscle soreness or fatigue, but the pain or discomfort you are trying to eliminate should never worsen during these exercises. If this pain does worsen, stop and make certain you are following the directions exactly. If the pain is still present after adjustments, discontinue the exercise until you can discuss the trouble with your clinician.  STRENGTH - Cervical Flexors, Isometric  Face a wall, standing about 6 inches away. Place a small pillow, a ball about 6-8 inches in diameter, or a folded towel between your forehead and the wall.  Slightly tuck your chin and gently push your forehead into the soft object. Push only with mild to moderate intensity, building up tension gradually. Keep your jaw and forehead relaxed.  Hold 10 to 20 seconds. Keep your breathing relaxed.  Release the tension slowly. Relax your neck muscles completely before you start the next repetition. Repeat 2-3 times. Complete this exercise 1 time per day.  STRENGTH- Cervical Lateral Flexors, Isometric   Stand about 6 inches away from a wall. Place a small pillow, a ball about  6-8 inches in diameter, or a folded towel between the side of your head and the wall.  Slightly tuck your chin and gently tilt your head into the soft object. Push only with mild to moderate intensity, building up tension gradually. Keep your jaw and forehead relaxed.  Hold 10 to 20 seconds. Keep your breathing relaxed.  Release the tension slowly. Relax your neck muscles completely before you start the next repetition. Repeat 2-3 times. Complete this exercise 1 time per day.  STRENGTH - Cervical Extensors, Isometric   Stand about 6 inches away from a wall. Place a small pillow, a ball about 6-8 inches in diameter, or a folded towel between the back of your head and the wall.  Slightly tuck your chin and gently tilt your head back into the soft object. Push only with mild to moderate intensity, building up tension gradually. Keep your jaw and forehead relaxed.  Hold 10 to 20 seconds. Keep your breathing relaxed.  Release the tension slowly. Relax your neck muscles completely before you start the next repetition. Repeat 2-3 times. Complete this exercise 1 time per day.  POSTURE AND BODY MECHANICS CONSIDERATIONS Keeping correct posture when sitting, standing or completing your activities will reduce the stress  put on different body tissues, allowing injured tissues a chance to heal and limiting painful experiences. The following are general guidelines for improved posture. Your physician or physical therapist will provide you with any instructions specific to your needs. While reading these guidelines, remember:  The exercises prescribed by your provider will help you have the flexibility and strength to maintain correct postures.  The correct posture provides the optimal environment for your joints to work. All of your joints have less wear and tear when properly supported by a spine with good posture. This means you will experience a healthier, less painful body.  Correct posture must be  practiced with all of your activities, especially prolonged sitting and standing. Correct posture is as important when doing repetitive low-stress activities (typing) as it is when doing a single heavy-load activity (lifting).  PROLONGED STANDING WHILE SLIGHTLY LEANING FORWARD When completing a task that requires you to lean forward while standing in one place for a long time, place either foot up on a stationary 2- to 4-inch high object to help maintain the best posture. When both feet are on the ground, the low back tends to lose its slight inward curve. If this curve flattens (or becomes too large), then the back and your other joints will experience too much stress, fatigue more quickly, and can cause pain.   RESTING POSITIONS Consider which positions are most painful for you when choosing a resting position. If you have pain with flexion-based activities (sitting, bending, stooping, squatting), choose a position that allows you to rest in a less flexed posture. You would want to avoid curling into a fetal position on your side. If your pain worsens with extension-based activities (prolonged standing, working overhead), avoid resting in an extended position such as sleeping on your stomach. Most people will find more comfort when they rest with their spine in a more neutral position, neither too rounded nor too arched. Lying on a non-sagging bed on your side with a pillow between your knees, or on your back with a pillow under your knees will often provide some relief. Keep in mind, being in any one position for a prolonged period of time, no matter how correct your posture, can still lead to stiffness.  WALKING Walk with an upright posture. Your ears, shoulders, and hips should all line up. OFFICE WORK When working at a desk, create an environment that supports good, upright posture. Without extra support, muscles fatigue and lead to excessive strain on joints and other tissues.  CHAIR:  A chair  should be able to slide under your desk when your back makes contact with the back of the chair. This allows you to work closely.  The chair's height should allow your eyes to be level with the upper part of your monitor and your hands to be slightly lower than your elbows.  Body position: ? Your feet should make contact with the floor. If this is not possible, use a foot rest. ? Keep your ears over your shoulders. This will reduce stress on your neck and low back.

## 2018-06-23 NOTE — Progress Notes (Signed)
Chief Complaint  Patient presents with  . Dizziness  . Ear Fullness    Pt is here for bilateral ear pain. Duration: 7 days Progression: slightly worsening Associated symptoms: sneezing, ear pressure, itchy throat, and sinus pressure Denies: sore throat, bleeding, or discharge from ear Treatment to date: Migraine cocktail in ED, helped for a bit but then s/s's returned; Tylenol-helps for a bit and then returns. She does have a hx of allergies and believes she may be allergic to ragweed. Does not take anything for allergies.  +neck pain, +R arm weakness; CT head neg in ED. These neurologic issues have also been going on for 1 week, constant.  Denies inj.  Does not chew gum.   ROS:  HEENT: +ear pain Costitutional: Denies fevers  Past Medical History:  Diagnosis Date  . Abnormal SPEP 07/26/2016  . Arthritis    knees, hands  . Back pain 07/14/2016  . Bowel obstruction ([place]) 11/2014  . C. difficile diarrhea   . CHF (congestive heart failure) ([place])   . Congestive heart failure ([place]) 02/24/2015   S/p pacemaker  . Depression   . Elevated sed rate 08/15/2017  . Epigastric pain 03/22/2017  . GERD (gastroesophageal reflux disease)   . Hyperlipidemia   . Hypertension   . 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 Low back pain 07/14/2016  . 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 Myalgia 12/31/2016  . Obstructive sleep apnea 03/31/2015  . SOB (shortness of breath) 04/09/2016  . Stroke [facility])    TIAs   Family History  Problem Relation Age of Onset  . Diabetes Mother   . Hypertension Mother   . Heart disease Mother        s/p 1 stent  . Hyperlipidemia Mother   . Arthritis Mother   . Cancer Father        COLON  . Colon cancer Father 57  . Irritable bowel syndrome Sister   . Hyperlipidemia Daughter   . Hypertension Daughter   . Hypertension Maternal Grandmother   . Arthritis Maternal Grandmother   . Heart disease Maternal Grandfather        MI  .  Hypertension Maternal Grandfather   . Arthritis Maternal Grandfather   . Hypertension Son    Past Surgical History:  Procedure Laterality Date  . ABDOMINAL HYSTERECTOMY     menorraghia, 2006, total  . CHOLECYSTECTOMY    . KNEE SURGERY     right, repair torn torn cartialage  . PACEMAKER INSERTION  08/2014  . TONSILLECTOMY    . TUBAL LIGATION      BP 118/84 (BP Location: Left Arm, Patient Position: Sitting, Cuff Size: Normal)   Pulse 91   Temp 98 F (36.7 C) (Oral)   Ht 5\' 4"  (1.626 m)   Wt 174 lb (78.9 kg)   SpO2 93%   BMI 29.87 kg/m  General: Awake, alert, appearing stated age HEENT:  L ear- Canal patent without drainage or erythema, TM is neg R ear- canal patent without drainage or erythema, TM is mildly retracted, no erythema, fluid or bulging Nose- nares patent and without discharge Mouth- Lips, gums and dentition unremarkable, pharynx is without erythema or exudate Neck: No adenopathy Heart: RRR, no LE edema MSK: +TTP over TMJ, temporalis, subocc triangle and traps b/l Neuro: DTR's equal and symmetric Lungs: CTAB, Normal effort, no accessory muscle use Psych: Age appropriate judgment and insight, normal mood and flat affect  Dysfunction of both eustachian tubes - Plan: predniSONE (DELTASONE) 20 MG  tablet, methylPREDNISolone acetate (DEPO-MEDROL) injection 80 mg  Seasonal allergies - Plan: levocetirizine (XYZAL) 5 MG tablet, methylPREDNISolone acetate (DEPO-MEDROL) injection 80 mg  Tension headache - Plan: predniSONE (DELTASONE) 20 MG tablet, methylPREDNISolone acetate (DEPO-MEDROL) injection 80 mg  Orders as above. Tylenol, heat, ice, stretches/exercises.  Tx allergies. Xyzal, OTC options written down.  F/u in 1 week if symptoms fail to improve, sooner if needed. Pt voiced understanding and agreement to the plan.  [place], DO 06/23/18 8:07 AM

## 2018-06-24 DIAGNOSIS — R112 Nausea with vomiting, unspecified: Secondary | ICD-10-CM | POA: Diagnosis not present

## 2018-06-24 DIAGNOSIS — R1084 Generalized abdominal pain: Secondary | ICD-10-CM | POA: Diagnosis not present

## 2018-06-24 DIAGNOSIS — R197 Diarrhea, unspecified: Secondary | ICD-10-CM | POA: Diagnosis not present

## 2018-06-24 DIAGNOSIS — R000 Tachycardia, unspecified: Secondary | ICD-10-CM | POA: Diagnosis not present

## 2018-06-24 DIAGNOSIS — G44209 Tension-type headache, unspecified, not intractable: Secondary | ICD-10-CM | POA: Diagnosis not present

## 2018-06-24 DIAGNOSIS — R1011 Right upper quadrant pain: Secondary | ICD-10-CM | POA: Diagnosis not present

## 2018-06-24 DIAGNOSIS — R42 Dizziness and giddiness: Secondary | ICD-10-CM | POA: Diagnosis not present

## 2018-06-24 DIAGNOSIS — R200 Anesthesia of skin: Secondary | ICD-10-CM | POA: Diagnosis not present

## 2018-06-28 ENCOUNTER — Ambulatory Visit: Payer: Medicare Other

## 2018-06-28 ENCOUNTER — Ambulatory Visit: Payer: Self-pay | Admitting: Family Medicine

## 2018-06-28 DIAGNOSIS — R1013 Epigastric pain: Secondary | ICD-10-CM | POA: Diagnosis not present

## 2018-06-28 DIAGNOSIS — R112 Nausea with vomiting, unspecified: Secondary | ICD-10-CM | POA: Diagnosis not present

## 2018-06-28 NOTE — Telephone Encounter (Signed)
Cannot really give good advice on this over the phone. She should be evaluated fist.

## 2018-06-28 NOTE — Telephone Encounter (Signed)
Patient is calling- tearful- she states she was not told not to eat before her procedure today. She states she can not wait 2 weeks for the rescheduled appointment- is there any one who can see her earlier? [phone] Call to office to let them know what happened with patient- talked with Trish- patient is presently going to [facility]- will encourage her to stay with them- referral may push her out even further. Patient does have hospital follow up appointment tomorrow. Call to patient- she is on the cancellation list. Encouraged her to stay with current provider. She is in pain and wants to know what to do for that- will send note to PCP for advisement in the meantime.   Reason for Disposition . Nursing judgment or information in reference  Answer Assessment - Initial Assessment Questions 1. REASON FOR CALL: "What is your main concern right now?"     Needs appointment for GI- wants sooner that 2 weeks 2. ONSET: "When did the pain start?"     Ongoing pain- patient states she has been seen at [facility] 3 times this week- they just treat her pain and send her away- she  Needs help 3. SEVERITY: "How bad is the abdominal?"     Severe- patient is having nausea and pain 4. FEVER: "Do you have a fever?"     Did not ask 5. OTHER SYMPTOMS: "Do you have any other new symptoms?"     Constipation from pain medication 6. INTERVENTIONS AND RESPONSE: "What have you done so far to try to make this better? What medications have you used?"     n/a 7. PREGNANCY: "Is there any chance you are pregnant?"     n/a  Protocols used: NO GUIDELINE AVAILABLE-A-AH

## 2018-06-29 ENCOUNTER — Ambulatory Visit (INDEPENDENT_AMBULATORY_CARE_PROVIDER_SITE_OTHER): Payer: Medicare Other | Admitting: Medical

## 2018-06-29 ENCOUNTER — Telehealth: Payer: Self-pay | Admitting: Medical

## 2018-06-29 ENCOUNTER — Inpatient Hospital Stay: Payer: Medicare Other | Admitting: Medical

## 2018-06-29 ENCOUNTER — Encounter: Payer: Self-pay | Admitting: Medical

## 2018-06-29 VITALS — BP 118/78 | HR 109 | Temp 98.0°F | Resp 16 | Ht 64.0 in | Wt 173.8 lb

## 2018-06-29 DIAGNOSIS — K2100 Gastro-esophageal reflux disease with esophagitis, without bleeding: Secondary | ICD-10-CM

## 2018-06-29 DIAGNOSIS — R1013 Epigastric pain: Secondary | ICD-10-CM

## 2018-06-29 MED ORDER — RANITIDINE HCL 150 MG PO CAPS: 150.0000 mg | ORAL_CAPSULE | Freq: Two times a day (BID) | ORAL | 0 refills | Status: DC

## 2018-06-29 MED ORDER — GI COCKTAIL ~~LOC~~
30.0000 mL | Freq: Once | ORAL | Status: AC
  Administered 2018-06-29: 30 mL via ORAL

## 2018-06-29 MED ORDER — SUCRALFATE 1 G PO TABS: 1.0000 g | ORAL_TABLET | Freq: Three times a day (TID) | ORAL | 0 refills | Status: DC

## 2018-06-29 NOTE — Patient Instructions (Addendum)
For your history of epigastric pain, we gave you a GI cocktail today.  I want you to continue with Protonix and I did write you a prescription of ranitidine as well.  In addition I refilled your prior Carafate prescription.  I do want to get a CBC, CMP and pancreas enzymes studies today.  Make sure those look okay.  Make sure you stay well-hydrated and eat small bland meals.  You recently had that EGD procedure canceled since you were not fasting prior to the procedure.  Now that is rescheduled for a 2 weeks out.  Keep that schedule appointment.  Since you do want a quicker appointment I did send place a referral and will see if GI MD in our building has a quick appointment and availability for EGD next week.  If that does not look promising then would recommend that you keep current appointment for 2 weeks with [facility].  Follow-up in 7 days or as needed.  If any acute severe abdomen pain/changes in signs and symptoms then recommend ED evaluation again.

## 2018-06-29 NOTE — Progress Notes (Signed)
Subjective:    [name], female    DOB: May 08, 1956, 63 y.o.   MRN: [id number]  HPI  Pt in for evaluation.  She has had abdomen pain since for about 2 months(epistric pain with nausea). Pt was scheduled to have EGD yesterday with Dr. Ena Dawley but she had eaten and procedure was canceled. Now she is scheduled to be done in 2 weeks. She had had ED visits for pain in past. She describes dehydration finding and getting iv hydration.  [facility] Tami [name] is a 62 y.o. Black or [place] American female who presents today for epigastric pain, nausea, and vomiting. Her chart reviewed and followed by Alger Memos, MD ; last seen in 11/2017 for epigastric pain and C Diff diarrhea.   She's been experiencing epigastric pain for the past few months. It occurs about 3x/week. She has associated nausea and occasional vomiting. About 2x/week she has to vomit about an hour after eating. No hematemesis. She is on Pantoprazole 40 mg BID for heartburn. No dysphagia. She is having regular BM's. No hematochezia. CTAP w/ contrast was unremarkable on 04/07/18 and 05/07/18. CBC last month with Hgb 10.9, PTL 125. CMP and Lipase normal.   Her last EGD was in 07/2017 due to dysphagia and regurgitation that revealed a lower esophageal stricture s/p dilation. Last colonoscopy in 06/2017 due to abdominal pain and constipation revealed sigmoid tics and a tortuous colon. Due for a colonoscopy in 06/2027.   Pt notes she has no gallbladder. She has decreased appetite.  She states decreased appetite. If she eats a lot will vomit.  Pt is trying to eat. Can eat small meals. If eats large meal will vomit.  Pt now scheduled to get egd on 07/14/2018. She might be on cancellation list.   She does not gi cocktails in past consistently take abdomen pain away for about 4-5 hours but then pain returns.    Review of Systems  Constitutional: Negative for chills, fatigue and fever.  Respiratory: Negative  for cough, choking, chest tightness, shortness of breath and wheezing.   Cardiovascular: Negative for chest pain and palpitations.  Gastrointestinal: Positive for abdominal pain and vomiting. Negative for abdominal distention, anal bleeding and nausea.       See hpi.  Musculoskeletal: Negative for back pain.  Skin: Negative for rash.  Hematological: Negative for adenopathy. Does not bruise/bleed easily.  Psychiatric/Behavioral: Negative for behavioral problems, confusion, decreased concentration and hallucinations.   Past Medical History:  Diagnosis Date  . Abnormal SPEP 07/26/2016  . Arthritis    knees, hands  . Back pain 07/14/2016  . Bowel obstruction ([place]) 11/2014  . C. difficile diarrhea   . CHF (congestive heart failure) ([place])   . Congestive heart failure ([place]) 02/24/2015   S/p pacemaker  . Depression   . Elevated sed rate 08/15/2017  . Epigastric pain 03/22/2017  . GERD (gastroesophageal reflux disease)   . Hyperlipidemia   . Hypertension   . 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 Low back pain 07/14/2016  . 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 Myalgia 12/31/2016  . Obstructive sleep apnea 03/31/2015  . SOB (shortness of breath) 04/09/2016  . Stroke [facility])    TIAs     Social History   Socioeconomic History  . Marital status: Single    Spouse name: [name]  . Number of children: [name]  . Years of education: [name]  . Highest education  level: [name]  Occupational History  . [name]  Social Needs  . Financial resource strain: [name]  . Food insecurity:    Worry: [name]    Inability: [name]  . Transportation needs:    Medical: [name]    Non-medical: [name]  Tobacco Use  . Smoking status: Never Smoker  . Smokeless tobacco: Never Used  Substance and Sexual Activity  . Alcohol use: No    Alcohol/week: 0.0 standard drinks  . Drug use: No  . Sexual activity: [name]  Lifestyle  .  Physical activity:    Days per week: [name]    Minutes per session: [name]  . Stress: [name]  Relationships  . Social connections:    Talks on phone: [name]    Gets together: [name]    Attends religious service: [name]    Active member of club or organization: [name]    Attends meetings of clubs or organizations: [name]    Relationship status: [name]  . Intimate partner violence:    Fear of current or ex partner: [name]    Emotionally abused: [name]    Physically abused: [name]    Forced sexual activity: [name]  Other Topics Concern  . [name]  Social History Narrative  . [name]    Past Surgical History:  Procedure Laterality Date  . ABDOMINAL HYSTERECTOMY     menorraghia, 2006, total  . CHOLECYSTECTOMY    . KNEE SURGERY     right, repair torn torn cartialage  . PACEMAKER INSERTION  08/2014  . TONSILLECTOMY    . TUBAL LIGATION      Family History  Problem Relation Age of Onset  . Diabetes Mother   . Hypertension Mother   . Heart disease Mother        s/p 1 stent  . Hyperlipidemia Mother   . Arthritis Mother   . Cancer Father        COLON  . Colon cancer Father 18  . Irritable bowel syndrome Sister   . Hyperlipidemia Daughter   . Hypertension Daughter   . Hypertension Maternal Grandmother   . Arthritis Maternal Grandmother   . Heart disease Maternal Grandfather        MI  . Hypertension Maternal Grandfather   . Arthritis Maternal Grandfather   . Hypertension Son     Allergies  Allergen Reactions  . Benazepril Anaphylaxis and Swelling    angioedema Throat and lip swelling  . Codeine Nausea And Vomiting  . Morphine     Redness and hives noted post IV admin on 07/05/17  . Ondansetron Hcl     Redness and hives post IV admin on 07/05/17    Current Outpatient Medications on File Prior to Visit  Medication Sig Dispense Refill  . acetaminophen (TYLENOL) 325 MG tablet Take 650 mg by  mouth.    Marland Kitchen albuterol (PROVENTIL HFA;VENTOLIN HFA) 108 (90 Base) MCG/ACT inhaler Inhale 2 puffs into the lungs every 6 (six) hours as needed for wheezing or shortness of breath. 1 Inhaler 1  . amLODipine (NORVASC) 10 MG tablet Take 1 tablet (10 mg total) by mouth daily. 90 tablet 1  . aspirin EC 81 MG tablet Take 1 tablet (81 mg total) by mouth daily. 90 tablet 1  .  carvedilol (COREG) 25 MG tablet Take 1 tablet (25 mg total) by mouth 2 (two) times daily with a meal. 180 tablet 1  . Cholecalciferol (VITAMIN D3) 5000 UNITS CAPS Take 1 capsule by mouth daily.    Marland Kitchen conjugated estrogens (PREMARIN) vaginal cream Place vaginally.    . dicyclomine (BENTYL) 20 MG tablet Take 1 tablet (20 mg total) by mouth 3 (three) times daily as needed for spasms. 90 tablet 1  . docusate sodium (COLACE) 50 MG capsule Take 50 mg by mouth 2 (two) times daily.    . ergocalciferol (VITAMIN D2) 50000 units capsule Take 1 capsule (50,000 Units total) by mouth once a week. 12 capsule 1  . escitalopram (LEXAPRO) 10 MG tablet Take 10 mg by mouth daily.    . famotidine (PEPCID) 40 MG tablet Take 1 tablet (40 mg total) by mouth at bedtime as needed for heartburn or indigestion. 90 tablet 1  . FERROCITE 324 MG TABS tablet Take 324 mg by mouth daily.  0  . ferrous fumarate (HEMOCYTE - 106 MG FE) 325 (106 Fe) MG TABS tablet Take 1 tablet (106 mg of iron total) by mouth 2 (two) times daily. 60 tablet 0  . fluticasone (FLONASE) 50 MCG/ACT nasal spray Place 2 sprays into both nostrils daily. 16 g 6  . HYDROcodone-homatropine (HYCODAN) 5-1.5 MG/5ML syrup Take 5 mLs by mouth every 6 (six) hours as needed for cough. 140 mL 0  . levocetirizine (XYZAL) 5 MG tablet Take 1 tablet (5 mg total) by mouth every evening. 30 tablet 2  . lidocaine (LIDODERM) 5 % Place 1 patch onto the skin daily. Remove & Discard patch within 12 hours or as directed by MD 30 patch 2  . Menthol (BENGAY ULTRA STRENGTH) 5 % PTCH Apply 1 patch topically daily. 15 patch 1    . metoCLOPramide (REGLAN) 10 MG tablet Take 1 tablet (10 mg total) by mouth 4 (four) times daily -  before meals and at bedtime. 60 tablet 4  . modafinil (PROVIGIL) 100 MG tablet Take 1 tablet (100 mg total) by mouth daily. 30 tablet 2  . Multiple Vitamins-Minerals (CENTRUM SILVER ADULT 50+ PO) Take by mouth.    . ondansetron (ZOFRAN) 4 MG tablet Take 1 tablet (4 mg total) by mouth every 8 (eight) hours as needed for nausea or vomiting. 30 tablet 1  . pantoprazole (PROTONIX) 40 MG tablet Take 1 tablet (40 mg total) by mouth 2 (two) times daily. 180 tablet 1  . potassium chloride SA (KLOR-CON M20) 20 MEQ tablet Take 1 tablet (20 mEq total) by mouth daily. 90 tablet 0  . sucralfate (CARAFATE) 1 g tablet Take 1 tablet (1 g total) by mouth 4 (four) times daily -  with meals and at bedtime. prn 120 tablet 1  . topiramate (TOPAMAX) 25 MG tablet Take 1 tablet (25 mg total) by mouth 2 (two) times daily. 180 tablet 0  . triamterene-hydrochlorothiazide (MAXZIDE-25) 37.5-25 MG tablet Take 1 tablet by mouth daily. 90 tablet 1  . vancomycin (VANCOCIN) 125 MG capsule Take 1 capsule po q6hr x 14 days Then take 1 capsule po x 7 days then take 1 capsule every 3 days for 8 weeks 81 capsule 0  . zolpidem (AMBIEN) 10 MG tablet Take 10 mg by mouth at bedtime as needed for sleep.     No current facility-administered medications on file prior to visit.     BP 118/78   Pulse (!) 109   Temp 98 F (36.7 C) (  Oral)   Resp 16   Ht 5\' 4"  (1.626 m)   Wt 173 lb 12.8 oz (78.8 kg)   SpO2 100%   BMI 29.83 kg/m       Objective:   Physical Exam  General Appearance- Not in acute distress.  HEENT Eyes- Scleraeral/Conjuntiva-bilat- Not Yellow. Mouth & Throat- Normal.  Chest and Lung Exam Auscultation: Breath sounds:-Normal. Adventitious sounds:- No Adventitious sounds.  Cardiovascular Auscultation:Rythm - Regular. Heart Sounds -Normal heart sounds.  Abdomen Inspection:-Inspection Normal.   Palpation/Perucssion: Palpation and Percussion of the abdomen reveal- mild  epigastric  Tenderness, No Rebound tenderness, No rigidity(Guarding) and No Palpable abdominal masses.  Liver:-Normal.  Spleen:- Normal.   Back- no cva tenderness.       Assessment & Plan:  For your history of epigastric pain, we gave you a GI cocktail today.  I want you to continue with Protonix and I did write you a prescription of ranitidine as well.  In addition I refilled your prior Carafate prescription.  I do want to get a CBC, CMP and pancreas enzymes studies today.  Make sure those look okay.  Make sure you stay well-hydrated and eat small bland meals.  You recently had that EGD procedure canceled since you were not fasting prior to the procedure.  Now that is rescheduled for a 2 weeks out.  Keep that schedule appointment.  Since you do want a quicker appointment I did send place a referral and will see if GI MD in our building has a quick appointment and availability for EGD next week.  If that does not look promising then would recommend that you keep current appointment for 2 weeks with [facility].  Follow-up in 7 days or as needed.  If any acute severe abdomen pain/changes in signs and symptoms then recommend ED evaluation again.  Mackie Pai, PA-C

## 2018-06-29 NOTE — Telephone Encounter (Signed)
Tami [name] -- FYI. Pt is seeing you today for a hospital follow up and will probably discuss her abdominal pain with you at that visit.

## 2018-06-29 NOTE — Telephone Encounter (Signed)
Would you mind seeing pt mammogram history. Looks like orders expired. I am seeing her for other concerns but she brought up that her insurance said that mammogram need to be done.  Looks like there is diagnostic mammogram and ultrasound placed.  Would you mind looking into that reordering and having Dr. Charlett Blake sign off on the orders.

## 2018-06-30 ENCOUNTER — Telehealth: Payer: Self-pay

## 2018-06-30 DIAGNOSIS — R51 Headache: Secondary | ICD-10-CM | POA: Diagnosis not present

## 2018-06-30 DIAGNOSIS — H5203 Hypermetropia, bilateral: Secondary | ICD-10-CM | POA: Diagnosis not present

## 2018-06-30 DIAGNOSIS — H2513 Age-related nuclear cataract, bilateral: Secondary | ICD-10-CM | POA: Diagnosis not present

## 2018-06-30 DIAGNOSIS — H31011 Macula scars of posterior pole (postinflammatory) (post-traumatic), right eye: Secondary | ICD-10-CM | POA: Diagnosis not present

## 2018-06-30 DIAGNOSIS — H52203 Unspecified astigmatism, bilateral: Secondary | ICD-10-CM | POA: Diagnosis not present

## 2018-06-30 LAB — COMPREHENSIVE METABOLIC PANEL
ALBUMIN: 3.4 g/dL — AB (ref 3.5–5.2)
ALT: 10 U/L (ref 0–35)
AST: 15 U/L (ref 0–37)
Alkaline Phosphatase: 40 U/L (ref 39–117)
BUN: 21 mg/dL (ref 6–23)
CALCIUM: 9.5 mg/dL (ref 8.4–10.5)
CHLORIDE: 105 meq/L (ref 96–112)
CO2: 27 mEq/L (ref 19–32)
Creatinine, Ser: 0.76 mg/dL (ref 0.40–1.20)
GFR: 98.51 mL/min (ref >60.00)
Glucose, Bld: 120 mg/dL — ABNORMAL HIGH (ref 70–99)
POTASSIUM: 3.7 meq/L (ref 3.5–5.1)
SODIUM: 136 meq/L (ref 135–145)
Total Bilirubin: 0.4 mg/dL (ref 0.2–1.2)
Total Protein: 9.7 g/dL — ABNORMAL HIGH (ref 6.0–8.3)

## 2018-06-30 LAB — CBC WITH DIFFERENTIAL/PLATELET
Basophils Absolute: 0 10*3/uL (ref 0.0–0.1)
Basophils Relative: 1.3 % (ref 0.0–3.0)
Eosinophils Absolute: 0 10*3/uL (ref 0.0–0.7)
Eosinophils Relative: 1.4 % (ref 0.0–5.0)
HCT: 33.3 % — ABNORMAL LOW (ref 36.0–46.0)
HEMOGLOBIN: 11 g/dL — AB (ref 12.0–15.0)
Lymphocytes Relative: 36.9 % (ref 12.0–46.0)
Lymphs Abs: 1.1 10*3/uL (ref 0.7–4.0)
MCHC: 33.1 g/dL (ref 30.0–36.0)
MCV: 85.7 fl (ref 78.0–100.0)
MONO ABS: 0.2 10*3/uL (ref 0.1–1.0)
Monocytes Relative: 7.6 % (ref 3.0–12.0)
Neutro Abs: 1.6 10*3/uL (ref 1.4–7.7)
Neutrophils Relative %: 52.8 % (ref 43.0–77.0)
Platelets: 112 10*3/uL — ABNORMAL LOW (ref 150.0–400.0)
RBC: 3.88 Mil/uL (ref 3.87–5.11)
RDW: 16.2 % — ABNORMAL HIGH (ref 11.5–15.5)
WBC: 3 10*3/uL — AB (ref 4.0–10.5)

## 2018-06-30 LAB — AMYLASE: AMYLASE: 76 U/L (ref 27–131)

## 2018-06-30 LAB — LIPASE: LIPASE: 43 U/L (ref 11.0–59.0)

## 2018-06-30 NOTE — Telephone Encounter (Signed)
Called patient left message for patient to call the office back  

## 2018-06-30 NOTE — Telephone Encounter (Signed)
I reviewed her chart and her creatinine and GFR look good. We can repeat test and add a Urine for microalb at her appt next week. Not sure what test they ran that determined their concern

## 2018-06-30 NOTE — Telephone Encounter (Signed)
Copied from [place] [id number]. Topic: General - Other >> Jun 29, 2018  3:47 PM Oneta Rack wrote: Osvaldo Human name: Ty  Relation to pt: NP from [facility]   Call back number: [phone]   Reason for call:  cognitive test was conducted and signaficant deficit was determined as per NP, further nephrology test should be done. As per NP she only makes one house call a year and would like Dr. Charlett Blake made aware.

## 2018-06-30 NOTE — Telephone Encounter (Signed)
Patient has had several orders for mammograms in Epic which test should she have  Please advise

## 2018-06-30 NOTE — Telephone Encounter (Signed)
It appears she never had the diagnostic mammogram and ultrasound that was recommended so she needs to have that. Please order

## 2018-07-01 ENCOUNTER — Encounter: Payer: Self-pay | Admitting: Hematology & Oncology

## 2018-07-01 ENCOUNTER — Inpatient Hospital Stay: Payer: Medicare Other | Attending: Hematology & Oncology | Admitting: Hematology & Oncology

## 2018-07-01 ENCOUNTER — Inpatient Hospital Stay: Payer: Medicare Other

## 2018-07-01 ENCOUNTER — Other Ambulatory Visit: Payer: Self-pay

## 2018-07-01 VITALS — BP 147/88 | HR 90 | Temp 98.0°F | Resp 18 | Wt 173.0 lb

## 2018-07-01 DIAGNOSIS — D472 Monoclonal gammopathy: Secondary | ICD-10-CM | POA: Diagnosis not present

## 2018-07-01 LAB — CBC WITH DIFFERENTIAL (CANCER CENTER ONLY)
BASOS PCT: 1 %
Basophils Absolute: 0 10*3/uL (ref 0.0–0.1)
Eosinophils Absolute: 0 10*3/uL (ref 0.0–0.5)
Eosinophils Relative: 1 %
HEMATOCRIT: 33.6 % — AB (ref 34.8–46.6)
HEMOGLOBIN: 10.6 g/dL — AB (ref 11.6–15.9)
LYMPHS ABS: 0.9 10*3/uL (ref 0.9–3.3)
Lymphocytes Relative: 33 %
MCH: 27.9 pg (ref 26.0–34.0)
MCHC: 31.5 g/dL — AB (ref 32.0–36.0)
MCV: 88.4 fL (ref 81.0–101.0)
MONO ABS: 0.2 10*3/uL (ref 0.1–0.9)
MONOS PCT: 6 %
NEUTROS ABS: 1.6 10*3/uL (ref 1.5–6.5)
NEUTROS PCT: 59 %
Platelet Count: 114 10*3/uL — ABNORMAL LOW (ref 145–400)
RBC: 3.8 MIL/uL (ref 3.70–5.32)
RDW: 15.2 % (ref 11.1–15.7)
WBC Count: 2.8 10*3/uL — ABNORMAL LOW (ref 3.9–10.0)

## 2018-07-01 LAB — CMP (CANCER CENTER ONLY)
ALBUMIN: 3.1 g/dL — AB (ref 3.5–5.0)
ALK PHOS: 46 U/L (ref 38–126)
ALT: 12 U/L (ref 0–44)
ANION GAP: 4 — AB (ref 5–15)
AST: 21 U/L (ref 15–41)
BUN: 17 mg/dL (ref 8–23)
CALCIUM: 9.5 mg/dL (ref 8.9–10.3)
CO2: 27 mmol/L (ref 22–32)
CREATININE: 0.83 mg/dL (ref 0.44–1.00)
Chloride: 107 mmol/L (ref 98–111)
GFR, Estimated: >60 mL/min (ref >60)
GLUCOSE: 94 mg/dL (ref 70–99)
Potassium: 3.9 mmol/L (ref 3.5–5.1)
SODIUM: 138 mmol/L (ref 135–145)
Total Bilirubin: 0.4 mg/dL (ref 0.3–1.2)
Total Protein: 10.5 g/dL — ABNORMAL HIGH (ref 6.5–8.1)

## 2018-07-01 NOTE — Progress Notes (Signed)
Hematology and Oncology Follow Up Visit  Tami [name] [id number] 1956-03-22 62 y.o. 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  She comes in with her cousin.  She is still having a lot of issues.  She had to go the emergency room because of abdominal pain.  This is in the epigastric area.  She was supposed to have a upper endoscopy but had eaten.  She goes for an upper endoscopy in 2 weeks.  Is had like she may have an ulcer.  She is on a lot of medications.  We went over all of her medications.  We got rid of medicines which I really do not think she needs to be taking.  She been taking Advil.  I told her to stop taking Advil.  Again, she is on quite a few medications.  Some medications are duplicates.  Again I do not think she really needs to be taking all of these medications.  As far as the smoldering myeloma goes, the last time she was here, her myeloma numbers looked okay.  Her M spike was 2.8 g/dL.  Her IgG level was 4300 mg/dL.  Her kappa light chain was 12.7 mg/dL.  She has had some vomiting.  She has had had some diarrhea.  Overall, her performance status is ECOG 1.  Medications:  Current Outpatient Medications:  .  acetaminophen (TYLENOL) 325 MG tablet, Take 650 mg by mouth., Disp: , Rfl:  .  albuterol (PROVENTIL HFA;VENTOLIN HFA) 108 (90 Base) MCG/ACT inhaler, Inhale 2 puffs into the lungs every 6 (six) hours as needed for wheezing or shortness of breath., Disp: 1 Inhaler, Rfl: 1 .  amLODipine (NORVASC) 10 MG tablet, Take 1 tablet (10 mg total) by mouth daily., Disp: 90 tablet, Rfl: 1 .  aspirin EC 81 MG tablet, Take 1 tablet (81 mg total) by mouth daily., Disp: 90 tablet, Rfl: 1 .  carvedilol (COREG) 25 MG tablet, Take 1 tablet (25 mg total) by mouth 2 (two) times daily with a meal., Disp: 180 tablet, Rfl: 1 .   Cholecalciferol (VITAMIN D3) 5000 UNITS CAPS, Take 1 capsule by mouth daily., Disp: , Rfl:  .  conjugated estrogens (PREMARIN) vaginal cream, Place vaginally., Disp: , Rfl:  .  dicyclomine (BENTYL) 20 MG tablet, Take 1 tablet (20 mg total) by mouth 3 (three) times daily as needed for spasms., Disp: 90 tablet, Rfl: 1 .  docusate sodium (COLACE) 50 MG capsule, Take 50 mg by mouth 2 (two) times daily., Disp: , Rfl:  .  ergocalciferol (VITAMIN D2) 50000 units capsule, Take 1 capsule (50,000 Units total) by mouth once a week., Disp: 12 capsule, Rfl: 1 .  escitalopram (LEXAPRO) 10 MG tablet, Take 10 mg by mouth daily., Disp: , Rfl:  .  famotidine (PEPCID) 40 MG tablet, Take 1 tablet (40 mg total) by mouth at bedtime as needed for heartburn or indigestion., Disp: 90 tablet, Rfl: 1 .  FERROCITE 324 MG TABS tablet, Take 324 mg by mouth daily., Disp: , Rfl: 0 .  ferrous fumarate (HEMOCYTE - 106 MG FE) 325 (106 Fe) MG TABS tablet, Take 1 tablet (106 mg of iron total) by mouth 2 (two) times daily., Disp: 60 tablet, Rfl: 0 .  fluticasone (FLONASE) 50 MCG/ACT nasal spray, Place 2 sprays into both nostrils daily., Disp: 16 g, Rfl:  6 .  HYDROcodone-homatropine (HYCODAN) 5-1.5 MG/5ML syrup, Take 5 mLs by mouth every 6 (six) hours as needed for cough., Disp: 140 mL, Rfl: 0 .  levocetirizine (XYZAL) 5 MG tablet, Take 1 tablet (5 mg total) by mouth every evening., Disp: 30 tablet, Rfl: 2 .  lidocaine (LIDODERM) 5 %, Place 1 patch onto the skin daily. Remove & Discard patch within 12 hours or as directed by MD, Disp: 30 patch, Rfl: 2 .  Menthol (BENGAY ULTRA STRENGTH) 5 % PTCH, Apply 1 patch topically daily., Disp: 15 patch, Rfl: 1 .  metoCLOPramide (REGLAN) 10 MG tablet, Take 1 tablet (10 mg total) by mouth 4 (four) times daily -  before meals and at bedtime., Disp: 60 tablet, Rfl: 4 .  modafinil (PROVIGIL) 100 MG tablet, Take 1 tablet (100 mg total) by mouth daily., Disp: 30 tablet, Rfl: 2 .  Multiple Vitamins-Minerals  (CENTRUM SILVER ADULT 50+ PO), Take by mouth., Disp: , Rfl:  .  ondansetron (ZOFRAN) 4 MG tablet, Take 1 tablet (4 mg total) by mouth every 8 (eight) hours as needed for nausea or vomiting., Disp: 30 tablet, Rfl: 1 .  pantoprazole (PROTONIX) 40 MG tablet, Take 1 tablet (40 mg total) by mouth 2 (two) times daily., Disp: 180 tablet, Rfl: 1 .  potassium chloride SA (KLOR-CON M20) 20 MEQ tablet, Take 1 tablet (20 mEq total) by mouth daily., Disp: 90 tablet, Rfl: 0 .  ranitidine (ZANTAC) 150 MG capsule, Take 1 capsule (150 mg total) by mouth 2 (two) times daily., Disp: 60 capsule, Rfl: 0 .  sucralfate (CARAFATE) 1 g tablet, Take 1 tablet (1 g total) by mouth 4 (four) times daily -  with meals and at bedtime., Disp: 120 tablet, Rfl: 0 .  topiramate (TOPAMAX) 25 MG tablet, Take 1 tablet (25 mg total) by mouth 2 (two) times daily., Disp: 180 tablet, Rfl: 0 .  triamterene-hydrochlorothiazide (MAXZIDE-25) 37.5-25 MG tablet, Take 1 tablet by mouth daily., Disp: 90 tablet, Rfl: 1 .  vancomycin (VANCOCIN) 125 MG capsule, Take 1 capsule po q6hr x 14 days Then take 1 capsule po x 7 days then take 1 capsule every 3 days for 8 weeks, Disp: 81 capsule, Rfl: 0 .  zolpidem (AMBIEN) 10 MG tablet, Take 10 mg by mouth at bedtime as needed for sleep., Disp: , Rfl:   Allergies:  Allergies  Allergen Reactions  . Benazepril Anaphylaxis and Swelling    angioedema Throat and lip swelling  . Codeine Nausea And Vomiting  . Morphine     Redness and hives noted post IV admin on 07/05/17  . Ondansetron Hcl     Redness and hives post IV admin on 07/05/17    Past Medical History, Surgical history, Social history, and Family History were reviewed and updated.  Review of Systems: Review of Systems  Constitutional: Negative.   HENT: Negative.   Eyes: Negative.   Respiratory: Negative.   Cardiovascular: Negative.   Gastrointestinal: Negative.   Genitourinary: Negative.   Musculoskeletal: Negative.   Skin: Negative.     Neurological: Negative.   Endo/Heme/Allergies: Negative.   Psychiatric/Behavioral: Negative.      Physical Exam:  weight is 173 lb (78.5 kg). Her oral temperature is 98 F (36.7 C). Her blood pressure is 147/88 (abnormal) and her pulse is 90. Her respiration is 18 and oxygen saturation is 100%.   Wt Readings from Last 3 Encounters:  07/01/18 173 lb (78.5 kg)  06/29/18 173 lb 12.8 oz (78.8 kg)  06/23/18 174  lb (78.9 kg)     Physical Exam  Constitutional: She is oriented to person, place, and time.  HENT:  Head: Normocephalic and atraumatic.  Mouth/Throat: Oropharynx is clear and moist.  Eyes: Pupils are equal, round, and reactive to light. EOM are normal.  Neck: Normal range of motion.  Cardiovascular: Normal rate, regular rhythm and normal heart sounds.  Pulmonary/Chest: Effort normal and breath sounds normal.  Abdominal: Soft. Bowel sounds are normal.  Musculoskeletal: Normal range of motion. She exhibits no edema, tenderness or deformity.  Lymphadenopathy:    She has no cervical adenopathy.  Neurological: She is alert and oriented to person, place, and time.  Skin: Skin is warm and dry. No rash noted. No erythema.  Psychiatric: She has a normal mood and affect. Her behavior is normal. Judgment and thought content normal.  Vitals reviewed.  a.   Lab Results  Component Value Date   WBC 2.8 (L) 07/01/2018   HGB 10.6 (L) 07/01/2018   HCT 33.6 (L) 07/01/2018   MCV 88.4 07/01/2018   PLT 114 (L) 07/01/2018     Chemistry      Component Value Date/Time   NA 136 06/29/2018 1604   NA 143 11/08/2017 1127   NA 140 12/31/2016 1000   K 3.7 06/29/2018 1604   K 4.0 11/08/2017 1127   K 3.6 12/31/2016 1000   CL 105 06/29/2018 1604   CL 107 11/08/2017 1127   CO2 27 06/29/2018 1604   CO2 25 11/08/2017 1127   CO2 24 12/31/2016 1000   BUN 21 06/29/2018 1604   BUN 19 11/08/2017 1127   BUN 22.3 12/31/2016 1000   CREATININE 0.76 06/29/2018 1604   CREATININE 0.60 04/29/2018  0846   CREATININE 0.9 11/08/2017 1127   CREATININE 0.8 12/31/2016 1000      Component Value Date/Time   CALCIUM 9.5 06/29/2018 1604   CALCIUM 9.9 11/08/2017 1127   CALCIUM 9.5 12/31/2016 1000   ALKPHOS 40 06/29/2018 1604   ALKPHOS 44 11/08/2017 1127   ALKPHOS 51 12/31/2016 1000   AST 15 06/29/2018 1604   AST 29 04/29/2018 0846   AST 28 12/31/2016 1000   ALT 10 06/29/2018 1604   ALT 19 04/29/2018 0846   ALT 36 11/08/2017 1127   ALT 16 12/31/2016 1000   BILITOT 0.4 06/29/2018 1604   BILITOT 0.6 04/29/2018 0846   BILITOT 0.46 12/31/2016 1000         Impression and Plan: Tami [name] is a 62 year old African-American female.  Again, it is hard to say how much of all of her issues or anything related to this monoclonal gammopathy.  I do have her do a 24-hour urine.  This I think will help [place] out.  I still would like to hold off on treating this smoldering myeloma if we can.  I would like to see her back in 4 5 weeks.  I gave her my business card to give to the gastroenterologist so that he will fax me the endoscopy report.  I spent about 45 minutes with she and her cousin.  Over 80% of the time was face-to-face with them.     [name], MD 8/23/201910:24 AM

## 2018-07-02 DIAGNOSIS — M25532 Pain in left wrist: Secondary | ICD-10-CM | POA: Diagnosis not present

## 2018-07-02 DIAGNOSIS — W19XXXA Unspecified fall, initial encounter: Secondary | ICD-10-CM | POA: Diagnosis not present

## 2018-07-02 DIAGNOSIS — M79642 Pain in left hand: Secondary | ICD-10-CM | POA: Diagnosis not present

## 2018-07-02 DIAGNOSIS — Y999 Unspecified external cause status: Secondary | ICD-10-CM | POA: Diagnosis not present

## 2018-07-02 DIAGNOSIS — W010XXA Fall on same level from slipping, tripping and stumbling without subsequent striking against object, initial encounter: Secondary | ICD-10-CM | POA: Diagnosis not present

## 2018-07-02 LAB — IGG, IGA, IGM
IGG (IMMUNOGLOBIN G), SERUM: 4645 mg/dL — AB (ref 700–1600)
IGM (IMMUNOGLOBULIN M), SRM: 147 mg/dL (ref 26–217)
IgA: 287 mg/dL (ref 87–352)

## 2018-07-02 LAB — BETA 2 MICROGLOBULIN, SERUM: Beta-2 Microglobulin: 1.9 mg/L (ref 0.6–2.4)

## 2018-07-04 DIAGNOSIS — K219 Gastro-esophageal reflux disease without esophagitis: Secondary | ICD-10-CM | POA: Diagnosis not present

## 2018-07-04 DIAGNOSIS — R7303 Prediabetes: Secondary | ICD-10-CM | POA: Diagnosis not present

## 2018-07-04 DIAGNOSIS — Z79899 Other long term (current) drug therapy: Secondary | ICD-10-CM | POA: Diagnosis not present

## 2018-07-04 DIAGNOSIS — N390 Urinary tract infection, site not specified: Secondary | ICD-10-CM | POA: Diagnosis not present

## 2018-07-04 DIAGNOSIS — R3129 Other microscopic hematuria: Secondary | ICD-10-CM | POA: Diagnosis not present

## 2018-07-04 LAB — KAPPA/LAMBDA LIGHT CHAINS
KAPPA, LAMDA LIGHT CHAIN RATIO: 12.07 — AB (ref 0.26–1.65)
Kappa free light chain: 125.5 mg/L — ABNORMAL HIGH (ref 3.3–19.4)
Lambda free light chains: 10.4 mg/L (ref 5.7–26.3)

## 2018-07-05 ENCOUNTER — Ambulatory Visit: Payer: Medicare Other | Admitting: Physical Therapy

## 2018-07-06 ENCOUNTER — Emergency Department (HOSPITAL_BASED_OUTPATIENT_CLINIC_OR_DEPARTMENT_OTHER)
Admission: EM | Admit: 2018-07-06 | Discharge: 2018-07-06 | Disposition: A | Discharge status: Home / Self Care | Payer: Medicare Other | Attending: Emergency Medicine | Admitting: Emergency Medicine

## 2018-07-06 ENCOUNTER — Other Ambulatory Visit: Payer: Self-pay

## 2018-07-06 ENCOUNTER — Encounter (HOSPITAL_BASED_OUTPATIENT_CLINIC_OR_DEPARTMENT_OTHER): Payer: Self-pay | Admitting: Emergency Medicine

## 2018-07-06 ENCOUNTER — Emergency Department (HOSPITAL_BASED_OUTPATIENT_CLINIC_OR_DEPARTMENT_OTHER): Payer: Medicare Other

## 2018-07-06 DIAGNOSIS — Z7982 Long term (current) use of aspirin: Secondary | ICD-10-CM | POA: Insufficient documentation

## 2018-07-06 DIAGNOSIS — Z79899 Other long term (current) drug therapy: Secondary | ICD-10-CM | POA: Diagnosis not present

## 2018-07-06 DIAGNOSIS — I509 Heart failure, unspecified: Secondary | ICD-10-CM | POA: Insufficient documentation

## 2018-07-06 DIAGNOSIS — E876 Hypokalemia: Secondary | ICD-10-CM | POA: Insufficient documentation

## 2018-07-06 DIAGNOSIS — M545 Low back pain, unspecified: Secondary | ICD-10-CM

## 2018-07-06 DIAGNOSIS — Z950 Presence of cardiac pacemaker: Secondary | ICD-10-CM | POA: Insufficient documentation

## 2018-07-06 DIAGNOSIS — R000 Tachycardia, unspecified: Secondary | ICD-10-CM | POA: Diagnosis not present

## 2018-07-06 DIAGNOSIS — N3000 Acute cystitis without hematuria: Secondary | ICD-10-CM | POA: Diagnosis not present

## 2018-07-06 DIAGNOSIS — I110 Hypertensive heart disease with heart failure: Secondary | ICD-10-CM | POA: Insufficient documentation

## 2018-07-06 DIAGNOSIS — R1013 Epigastric pain: Secondary | ICD-10-CM | POA: Diagnosis not present

## 2018-07-06 LAB — CBC WITH DIFFERENTIAL/PLATELET
Basophils Absolute: 0 10*3/uL (ref 0.0–0.1)
Basophils Relative: 0 %
EOS ABS: 0 10*3/uL (ref 0.0–0.7)
EOS PCT: 2 %
HCT: 35.8 % — ABNORMAL LOW (ref 36.0–46.0)
HEMOGLOBIN: 11.7 g/dL — AB (ref 12.0–15.0)
Lymphocytes Relative: 33 %
Lymphs Abs: 0.8 10*3/uL (ref 0.7–4.0)
MCH: 28.1 pg (ref 26.0–34.0)
MCHC: 32.7 g/dL (ref 30.0–36.0)
MCV: 85.9 fL (ref 78.0–100.0)
MONO ABS: 0.2 10*3/uL (ref 0.1–1.0)
MONOS PCT: 8 %
Neutro Abs: 1.3 10*3/uL — ABNORMAL LOW (ref 1.7–7.7)
Neutrophils Relative %: 57 %
PLATELETS: 120 10*3/uL — AB (ref 150–400)
RBC: 4.17 MIL/uL (ref 3.87–5.11)
RDW: 15.4 % (ref 11.5–15.5)
WBC: 2.3 10*3/uL — ABNORMAL LOW (ref 4.0–10.5)

## 2018-07-06 LAB — BASIC METABOLIC PANEL
Anion gap: 10 (ref 5–15)
BUN: 18 mg/dL (ref 8–23)
CO2: 26 mmol/L (ref 22–32)
CREATININE: 0.65 mg/dL (ref 0.44–1.00)
Calcium: 8.8 mg/dL — ABNORMAL LOW (ref 8.9–10.3)
Chloride: 105 mmol/L (ref 98–111)
GFR calc non Af Amer: >60 mL/min (ref >60)
GLUCOSE: 90 mg/dL (ref 70–99)
Potassium: 3 mmol/L — ABNORMAL LOW (ref 3.5–5.1)
Sodium: 141 mmol/L (ref 135–145)

## 2018-07-06 LAB — PROTEIN ELECTROPHORESIS, SERUM, WITH REFLEX
A/G Ratio: 0.5 — ABNORMAL LOW (ref 0.7–1.7)
Albumin ELP: 3.4 g/dL (ref 2.9–4.4)
Alpha-1-Globulin: 0.2 g/dL (ref 0.0–0.4)
Alpha-2-Globulin: 0.9 g/dL (ref 0.4–1.0)
Beta Globulin: 1.3 g/dL (ref 0.7–1.3)
Gamma Globulin: 3.9 g/dL — ABNORMAL HIGH (ref 0.4–1.8)
Globulin, Total: 6.4 g/dL — ABNORMAL HIGH (ref 2.2–3.9)
M-Spike, %: 3.2 g/dL — ABNORMAL HIGH
SPEP Interpretation: 0
Total Protein ELP: 9.8 g/dL — ABNORMAL HIGH (ref 6.0–8.5)

## 2018-07-06 LAB — URINALYSIS, MICROSCOPIC (REFLEX)

## 2018-07-06 LAB — URINALYSIS, ROUTINE W REFLEX MICROSCOPIC
Bilirubin Urine: NEGATIVE
Glucose, UA: NEGATIVE mg/dL
Ketones, ur: NEGATIVE mg/dL
LEUKOCYTES UA: NEGATIVE
Nitrite: NEGATIVE
PROTEIN: NEGATIVE mg/dL
SPECIFIC GRAVITY, URINE: 1.025 (ref 1.005–1.030)
pH: 6 (ref 5.0–8.0)

## 2018-07-06 LAB — IMMUNOFIXATION REFLEX, SERUM
IGA: 309 mg/dL (ref 87–352)
IGG (IMMUNOGLOBIN G), SERUM: 5060 mg/dL — AB (ref 700–1600)
IgM (Immunoglobulin M), Srm: 159 mg/dL (ref 26–217)

## 2018-07-06 MED ORDER — CEPHALEXIN 500 MG PO CAPS: 500.0000 mg | ORAL_CAPSULE | Freq: Two times a day (BID) | ORAL | 0 refills | Status: DC

## 2018-07-06 MED ORDER — HYDROCODONE-ACETAMINOPHEN 5-325 MG PO TABS: 1.0000 | ORAL_TABLET | ORAL | 0 refills | Status: DC | PRN

## 2018-07-06 MED ORDER — HYDROCODONE-ACETAMINOPHEN 5-325 MG PO TABS
1.0000 | ORAL_TABLET | Freq: Once | ORAL | Status: AC
  Administered 2018-07-06: 1 via ORAL
  Filled 2018-07-06: qty 1

## 2018-07-06 NOTE — ED Provider Notes (Signed)
[facility] EMERGENCY DEPARTMENT Provider Note   CSN: [id number] Arrival date & time: 07/06/18  0017     History   Chief Complaint Chief Complaint  Patient presents with  . Back Pain  . Flank Pain    right    HPI Tami [name] is a 62 y.o. female who presents with low back pain. PMH significant for CHF, arthritis, hx of low back pain, chronic epigastric pain, GERD, MGUS. She states that Saturday she was at a family reunion and she tripped and fell backwards with her hands outstretched to break her fall. She ended up sustaining a L wrist injury. She went to [facility] and had imaging which was negative and was placed in a splint. She didn't have back pain at that time. Two days ago she started to have low back pain. It is worse with sitting, getting up, walking, movement. Better with rest and lying down. The pain started to radiate to the right side today and therefore she decided to come to the ED because it was hurting so much. She also reports oliguria for the past couple days as well. No dysuria, hematuria, frequency. She reports abdominal pain and vomiting but this is due to her chronic pain which is currently being worked up by GI. No fever, syncope, unexplained weight loss, hx of cancer, loss of bowel/bladder function, saddle anesthesia, urinary retention, IVDU. She has an appointment with her doctor tomorrow.  HPI  Past Medical History:  Diagnosis Date  . Abnormal SPEP 07/26/2016  . Arthritis    knees, hands  . Back pain 07/14/2016  . Bowel obstruction ([place]) 11/2014  . C. difficile diarrhea   . CHF (congestive heart failure) ([place])   . Congestive heart failure ([place]) 02/24/2015   S/p pacemaker  . Depression   . Elevated sed rate 08/15/2017  . Epigastric pain 03/22/2017  . GERD (gastroesophageal reflux disease)   . Hyperlipidemia   . Hypertension   . 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 Low back pain 07/14/2016  . Monoclonal gammopathy of unknown significance (MGUS)  09/16/2016  . Myalgia 12/31/2016  . Obstructive sleep apnea 03/31/2015  . SOB (shortness of breath) 04/09/2016  . Stroke [facility])    TIAs    Patient Active Problem List   Diagnosis Date Noted  . Seasonal allergies 06/23/2018  . Right shoulder pain 06/07/2018  . Neck pain 06/07/2018  . Paresthesias 06/07/2018  . 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 Pain in thoracic spine 03/18/2018  . Thoracic back pain 02/15/2018  . Bronchitis 02/15/2018  . 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 Pneumonia 11/11/2017  . Cystitis 10/25/2017  . C. difficile colitis 09/15/2017  . Hypokalemia 08/20/2017  . Anemia 08/20/2017  . Elevated sed rate 08/15/2017  . 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 Epigastric pain 03/22/2017  . Myalgia 12/31/2016  . 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 SOB (shortness of breath) 07/26/2016  . Abnormal SPEP 07/26/2016  . Low back pain 07/14/2016  . Insomnia 12/29/2015  . Tension headache 10/21/2015  . Muscle spasms of neck 10/18/2015  . Dysuria 10/13/2015  . Sinusitis 07/07/2015  . AP (abdominal pain) 07/07/2015  . Cardiomyopathy ([place]) 04/19/2015  . Chest wall pain 04/19/2015  . Obstructive sleep apnea 03/31/2015  . Congestive heart failure ([place]) 02/24/2015  . Hyperlipidemia   . Hypertension   . Depression   . Stroke ([place])   . GERD (gastroesophageal reflux disease)   . Bowel obstruction ([place]) 11/09/2014  Past Surgical History:  Procedure Laterality Date  . ABDOMINAL HYSTERECTOMY     menorraghia, 2006, total  . CHOLECYSTECTOMY    . KNEE SURGERY     right, repair torn torn cartialage  . PACEMAKER INSERTION  08/2014  . TONSILLECTOMY    . TUBAL LIGATION       OB History   None      Home Medications    Prior to Admission medications   Medication Sig Start Date End Date Taking? Authorizing Provider  acetaminophen (TYLENOL) 325 MG tablet Take 650 mg by mouth. 06/07/17   [provider]  albuterol (PROVENTIL HFA;VENTOLIN HFA) 108 (90 Base) MCG/ACT inhaler Inhale 2 puffs into the lungs every 6 (six) hours as needed for wheezing or shortness of breath. 02/15/18   Mosie Lukes, MD  amLODipine (NORVASC) 10 MG tablet Take 1 tablet (10 mg total) by mouth daily. 02/15/18 02/15/19  Mosie Lukes, MD  aspirin EC 81 MG tablet Take 1 tablet (81 mg total) by mouth daily. 02/15/18   Mosie Lukes, MD  carvedilol (COREG) 25 MG tablet Take 1 tablet (25 mg total) by mouth 2 (two) times daily with a meal. 02/15/18   Mosie Lukes, MD  Cholecalciferol (VITAMIN D3) 5000 UNITS CAPS Take 1 capsule by mouth daily.    [provider]  conjugated estrogens (PREMARIN) vaginal cream Place vaginally. 01/31/14   [provider]  dicyclomine (BENTYL) 20 MG tablet Take 1 tablet (20 mg total) by mouth 3 (three) times daily as needed for spasms. 04/14/18   Mosie Lukes, MD  docusate sodium (COLACE) 50 MG capsule Take 50 mg by mouth 2 (two) times daily.    [provider]  ergocalciferol (VITAMIN D2) 50000 units capsule Take 1 capsule (50,000 Units total) by mouth once a week. 04/09/16   Mosie Lukes, MD  escitalopram (LEXAPRO) 10 MG tablet Take 10 mg by mouth daily.    [provider]  famotidine (PEPCID) 40 MG tablet Take 1 tablet (40 mg total) by mouth at bedtime as needed for heartburn or indigestion. 04/14/18   Mosie Lukes, MD  FERROCITE 324 MG TABS tablet Take 324 mg by mouth daily. 04/12/18   [provider]  ferrous fumarate (HEMOCYTE - 106 MG FE) 325 (106 Fe) MG TABS tablet Take 1 tablet (106 mg of iron total) by mouth 2 (two) times daily. 04/12/18   Mosie Lukes, MD  fluticasone (FLONASE) 50 MCG/ACT nasal spray Place 2 sprays into both nostrils daily. 02/15/18   Mosie Lukes, MD  HYDROcodone-homatropine [facility]) 5-1.5 MG/5ML syrup Take 5 mLs by mouth every 6 (six) hours as needed for cough. 02/15/18   Mosie Lukes, MD  levocetirizine (XYZAL)  5 MG tablet Take 1 tablet (5 mg total) by mouth every evening. 06/23/18   Shelda Pal, DO  lidocaine (LIDODERM) 5 % Place 1 patch onto the skin daily. Remove & Discard patch within 12 hours or as directed by MD 06/07/18   Jodelle Green, FNP  Menthol (BENGAY ULTRA STRENGTH) 5 % PTCH Apply 1 patch topically daily. 06/08/18   Jodelle Green, FNP  metoCLOPramide (REGLAN) 10 MG tablet Take 1 tablet (10 mg total) by mouth 4 (four) times daily -  before meals and at bedtime. 06/11/17   [name], MD  modafinil (PROVIGIL) 100 MG tablet Take 1 tablet (100 mg total) by mouth daily. 04/14/18   Mosie Lukes, MD  Multiple Vitamins-Minerals (  CENTRUM SILVER ADULT 50+ PO) Take by mouth.    [provider]  ondansetron (ZOFRAN) 4 MG tablet Take 1 tablet (4 mg total) by mouth every 8 (eight) hours as needed for nausea or vomiting. 04/14/18   Mosie Lukes, MD  pantoprazole (PROTONIX) 40 MG tablet Take 1 tablet (40 mg total) by mouth 2 (two) times daily. 02/15/18   Mosie Lukes, MD  potassium chloride SA (KLOR-CON M20) 20 MEQ tablet Take 1 tablet (20 mEq total) by mouth daily. 06/07/18   Mosie Lukes, MD  ranitidine (ZANTAC) 150 MG capsule Take 1 capsule (150 mg total) by mouth 2 (two) times daily. 06/29/18   Saguier, Percell Miller, PA-C  sucralfate (CARAFATE) 1 g tablet Take 1 tablet (1 g total) by mouth 4 (four) times daily -  with meals and at bedtime. 06/29/18   Saguier, Percell Miller, PA-C  topiramate (TOPAMAX) 25 MG tablet Take 1 tablet (25 mg total) by mouth 2 (two) times daily. 06/07/18   Mosie Lukes, MD  triamterene-hydrochlorothiazide (MAXZIDE-25) 37.5-25 MG tablet Take 1 tablet by mouth daily. 02/15/18   Mosie Lukes, MD  vancomycin (VANCOCIN) 125 MG capsule Take 1 capsule po q6hr x 14 days Then take 1 capsule po x 7 days then take 1 capsule every 3 days for 8 weeks 03/31/18   Mosie Lukes, MD  zolpidem (AMBIEN) 10 MG tablet Take 10 mg by mouth at bedtime as needed for sleep.    [provider]    Family History Family History  Problem Relation Age of Onset  . Diabetes Mother   . Hypertension Mother   . Heart disease Mother        s/p 1 stent  . Hyperlipidemia Mother   . Arthritis Mother   . Cancer Father        COLON  . Colon cancer Father 62  . Irritable bowel syndrome Sister   . Hyperlipidemia Daughter   . Hypertension Daughter   . Hypertension Maternal Grandmother   . Arthritis Maternal Grandmother   . Heart disease Maternal Grandfather        MI  . Hypertension Maternal Grandfather   . Arthritis Maternal Grandfather   . Hypertension Son     Social History Social History   Tobacco Use  . Smoking status: Never Smoker  . Smokeless tobacco: Never Used  Substance Use Topics  . Alcohol use: No    Alcohol/week: 0.0 standard drinks  . Drug use: No     Allergies   Benazepril; Codeine; Morphine; and Ondansetron hcl   Review of Systems Review of Systems  Constitutional: Negative for fever.  Genitourinary: Negative for difficulty urinating, dysuria, flank pain, frequency and urgency.       +oliguria  Musculoskeletal: Positive for back pain and myalgias.  Skin: Negative for wound.  Neurological: Negative for weakness and numbness.  All other systems reviewed and are negative.    Physical Exam Updated Vital Signs BP 136/85 (BP Location: Right Arm)   Pulse (!) 107   Temp 97.9 F (36.6 C) (Oral)   Resp 18   Ht 5\' 5"  (1.651 m)   Wt 79.8 kg   SpO2 99%   BMI 29.29 kg/m   Physical Exam  Constitutional: She is oriented to person, place, and time. She appears well-developed and well-nourished. No distress.  Calm, cooperative. Appears uncomfortable  HENT:  Head: Normocephalic and atraumatic.  Eyes: Pupils are equal, round, and reactive to light. Conjunctivae are normal. Right eye  exhibits no discharge. Left eye exhibits no discharge. No scleral icterus.  Neck: Normal range of motion.  Cardiovascular: Normal rate and regular rhythm.    Pulmonary/Chest: Effort normal and breath sounds normal. No respiratory distress.  Abdominal: Soft. Bowel sounds are normal. She exhibits no distension. There is tenderness (epigastric).  Musculoskeletal:  Back: Inspection: No masses, deformity, or rash Palpation: Lumbar midline spinal tenderness with right paraspinal muscle tenderness. Strength: 5/5 in lower extremities and normal plantar and dorsiflexion Sensation: Intact sensation with light touch in lower extremities bilaterally Reflexes: Patellar reflex is 2+ bilaterally SLR: Negative seated straight leg raise Gait: Antalgic gait  Neurological: She is alert and oriented to person, place, and time.  Skin: Skin is warm and dry.  Psychiatric: She has a normal mood and affect. Her behavior is normal.  Nursing note and vitals reviewed.    ED Treatments / Results  Labs (all labs ordered are listed, but only abnormal results are displayed) Labs Reviewed  URINALYSIS, ROUTINE W REFLEX MICROSCOPIC - Abnormal; Notable for the following components:      Result Value   Hgb urine dipstick TRACE (*)    All other components within normal limits  URINALYSIS, MICROSCOPIC (REFLEX) - Abnormal; Notable for the following components:   Bacteria, UA MANY (*)    All other components within normal limits  BASIC METABOLIC PANEL - Abnormal; Notable for the following components:   Potassium 3.0 (*)    Calcium 8.8 (*)    All other components within normal limits  CBC WITH DIFFERENTIAL/PLATELET - Abnormal; Notable for the following components:   WBC 2.3 (*)    Hemoglobin 11.7 (*)    HCT 35.8 (*)    Platelets 120 (*)    Neutro Abs 1.3 (*)    All other components within normal limits  URINE CULTURE    EKG None  Radiology Dg Lumbar Spine Complete  Result Date: 07/06/2018 CLINICAL DATA:  Fall on Sunday.  Low back pain. EXAM: LUMBAR SPINE - COMPLETE 4+ VIEW COMPARISON:  CT abdomen 06/24/2018 FINDINGS: AICD lead noted projecting over the cardiac  shadow. Cholecystectomy clips noted. Stable 6 mm of degenerative anterolisthesis at L4-5. No appreciable lumbar spine fracture. Bony demineralization. Lower lumbar degenerative facet arthropathy. Generally preserved intervertebral disc height. IMPRESSION: 1. No acute lumbar spine findings. 2. Stable 6 mm degenerative anterolisthesis at L4-5. 3. Lower lumbar spondylosis. Electronically Signed   By: Van Clines M.D.   On: 07/06/2018 12:22    Procedures Procedures (including critical care time)  Medications Ordered in ED Medications  HYDROcodone-acetaminophen (NORCO/VICODIN) 5-325 MG per tablet 1 tablet (1 tablet Oral Given 07/06/18 1202)     Initial Impression / Assessment and Plan / ED Course  I have reviewed the triage vital signs and the nursing notes.  Pertinent labs & imaging results that were available during my care of the patient were reviewed by me and considered in my medical decision making (see chart for details).  62 year old female presents with low back pain radiating to the right for the past 2-3 days. She has hx of chronic low back pain and is undergoing PT for this. She is initially tachycardic in triage. This has resolved on recheck. On exam she is tender in the low back. No CVA tenderness. She has epigastric tenderness on her abdominal exam but states this is chronic and she is being worked up by GI for this. No lower abdominal tenderness. No radicular symptoms or leg weakness. She reports oliguria but is  able to urinate and bladder scan was ~34 cc. CBC is remarkable for pancytopenia which is being worked up by oncology. BMP is remarkable for hypokalemia (3.0). She stopped her potassium supplements because her oncologist felt like she was on too many medicines and her potassium was normal at her last OV. Will restart her on this and have her rechecked. Her pain is improved with norco. UA shows questionable UTI with trace hgb, many bacteria. Culture was sent. Since she is  having a change in urinary symptoms will try a trial of antibiotic. She was given rx for Keflex, Norco and she will follow up with her doctor.  Final Clinical Impressions(s) / ED Diagnoses   Final diagnoses:  Acute right-sided low back pain without sciatica  Acute cystitis without hematuria  Hypokalemia    ED Discharge Orders    None       Recardo Evangelist, PA-C 07/06/18 [place], DO 07/06/18 1457

## 2018-07-06 NOTE — Discharge Instructions (Addendum)
Please take Keflex twice a day for 5 days for possible UTI Take norco as needed for severe pain. Also try a heating pad Restart your potassium. Follow up with your doctor for recheck of this

## 2018-07-06 NOTE — ED Triage Notes (Addendum)
Pt reports lower back pain and right flank pain , also reports oliguria , no dysuria. Denies Hx kidney stone. Hx UTI  Pt had fall 3 days ago , arm injury , splint in place , denies back injury during this fall.

## 2018-07-07 ENCOUNTER — Ambulatory Visit: Payer: Medicare Other | Admitting: *Deleted

## 2018-07-07 ENCOUNTER — Encounter: Payer: Self-pay | Admitting: Family Medicine

## 2018-07-07 ENCOUNTER — Ambulatory Visit (INDEPENDENT_AMBULATORY_CARE_PROVIDER_SITE_OTHER): Payer: Medicare Other | Admitting: Family Medicine

## 2018-07-07 VITALS — BP 132/82 | HR 78 | Temp 97.8°F | Resp 18 | Ht 64.0 in | Wt 176.2 lb

## 2018-07-07 DIAGNOSIS — R319 Hematuria, unspecified: Secondary | ICD-10-CM | POA: Diagnosis not present

## 2018-07-07 DIAGNOSIS — R296 Repeated falls: Secondary | ICD-10-CM | POA: Diagnosis not present

## 2018-07-07 DIAGNOSIS — I10 Essential (primary) hypertension: Secondary | ICD-10-CM

## 2018-07-07 DIAGNOSIS — Z23 Encounter for immunization: Secondary | ICD-10-CM

## 2018-07-07 DIAGNOSIS — R3129 Other microscopic hematuria: Secondary | ICD-10-CM | POA: Diagnosis not present

## 2018-07-07 DIAGNOSIS — M5441 Lumbago with sciatica, right side: Secondary | ICD-10-CM

## 2018-07-07 DIAGNOSIS — G8929 Other chronic pain: Secondary | ICD-10-CM

## 2018-07-07 LAB — URINE CULTURE: CULTURE: NO GROWTH

## 2018-07-07 MED ORDER — ACIDOPHILUS 100 MG PO CAPS: 1.0000 | ORAL_CAPSULE | Freq: Every day | ORAL | 0 refills | Status: DC

## 2018-07-07 NOTE — Patient Instructions (Addendum)
   Omeprazole and pantoprazole are similar so only one per day Back Pain, Adult Back pain is very common. The pain often gets better over time. The cause of back pain is usually not dangerous. Most people can learn to manage  their back pain on their own. Follow these instructions at home: Watch your back pain for any changes. The following actions may help to lessen any pain you are feeling:  Stay active. Start with short walks on flat ground if you can. Try to walk farther each day.  Exercise regularly as told by your doctor. Exercise helps your back heal faster. It also helps avoid future injury by keeping your muscles strong and flexible.  Do not sit, drive, or stand in one place for more than 30 minutes.  Do not stay in bed. Resting more than 1-2 days can slow down your recovery.  Be careful when you bend or lift an object. Use good form when lifting: ? Bend at your knees. ? Keep the object close to your body. ? Do not twist.  Sleep on a firm mattress. Lie on your side, and bend your knees. If you lie on your back, put a pillow under your knees.  Take medicines only as told by your doctor.  Put ice on the injured area. ? Put ice in a plastic bag. ? Place a towel between your skin and the bag. ? Leave the ice on for 20 minutes, 2-3 times a day for the first 2-3 days. After that, you can switch between ice and heat packs.  Avoid feeling anxious or stressed. Find good ways to deal with stress, such as exercise.  Maintain a healthy weight. Extra weight puts stress on your back.  Contact a doctor if:  You have pain that does not go away with rest or medicine.  You have worsening pain that goes down into your legs or buttocks.  You have pain that does not get better in one week.  You have pain at night.  You lose weight.  You have a fever or chills. Get help right away if:  You cannot control when you poop (bowel movement) or pee (urinate).  Your arms or legs feel  weak.  Your arms or legs lose feeling (numbness).  You feel sick to your stomach (nauseous) or throw up (vomit).  You have belly (abdominal) pain.  You feel like you may pass out (faint). This information is not intended to replace advice given to you by your health care provider. Make sure you discuss any questions you have with your health care provider. Document Released: 04/13/2008 Document Revised: 04/02/2016 Document Reviewed: 02/27/2014 Elsevier Interactive Patient Education  [employer].

## 2018-07-08 DIAGNOSIS — K625 Hemorrhage of anus and rectum: Secondary | ICD-10-CM

## 2018-07-08 LAB — COMPREHENSIVE METABOLIC PANEL
ALBUMIN: 3.4 g/dL — AB (ref 3.5–5.2)
ALT: 11 U/L (ref 0–35)
AST: 22 U/L (ref 0–37)
Alkaline Phosphatase: 39 U/L (ref 39–117)
BILIRUBIN TOTAL: 0.3 mg/dL (ref 0.2–1.2)
BUN: 18 mg/dL (ref 6–23)
CALCIUM: 9.1 mg/dL (ref 8.4–10.5)
CO2: 27 mEq/L (ref 19–32)
Chloride: 104 mEq/L (ref 96–112)
Creatinine, Ser: 0.83 mg/dL (ref 0.40–1.20)
GFR: 88.98 mL/min (ref >60.00)
Glucose, Bld: 99 mg/dL (ref 70–99)
Potassium: 3.8 mEq/L (ref 3.5–5.1)
Sodium: 138 mEq/L (ref 135–145)
Total Protein: 9.7 g/dL — ABNORMAL HIGH (ref 6.0–8.3)

## 2018-07-08 LAB — CBC
HCT: 31.8 % — ABNORMAL LOW (ref 36.0–46.0)
Hemoglobin: 10.5 g/dL — ABNORMAL LOW (ref 12.0–15.0)
MCHC: 33 g/dL (ref 30.0–36.0)
MCV: 86.4 fl (ref 78.0–100.0)
PLATELETS: 119 10*3/uL — AB (ref 150.0–400.0)
RBC: 3.68 Mil/uL — ABNORMAL LOW (ref 3.87–5.11)
RDW: 16.4 % — ABNORMAL HIGH (ref 11.5–15.5)
WBC: 2.6 10*3/uL — AB (ref 4.0–10.5)

## 2018-07-11 ENCOUNTER — Other Ambulatory Visit: Payer: Self-pay | Admitting: Family Medicine

## 2018-07-11 DIAGNOSIS — M5441 Lumbago with sciatica, right side: Secondary | ICD-10-CM

## 2018-07-11 DIAGNOSIS — R319 Hematuria, unspecified: Secondary | ICD-10-CM | POA: Insufficient documentation

## 2018-07-11 NOTE — Assessment & Plan Note (Signed)
Well controlled, no changes to meds. Encouraged heart healthy diet such as the DASH diet and exercise as tolerated.  °

## 2018-07-11 NOTE — Assessment & Plan Note (Signed)
Was seen at [facility] clinic and diagnosed with UTI. She is on an antibiotics. Urine culture today is negative.

## 2018-07-11 NOTE — Progress Notes (Signed)
Subjective:    [name], female    DOB: 1956-11-03, 62 y.o.   MRN: [id number]  No chief complaint on file.   HPI Patient is in today for follow up she fell on 8/24 at a family reunion and has been having increased pain in her back pain in lower back with right lower extremity pain and weakness. No incontinence. She was also seen at [facility] clinic for hematuria and treated for UTI. No recent febrile illness or hospitalizations. Denies CP/palp/SOB/HA/congestion/fevers/GI or GU c/o. Taking meds as prescribed  Past Medical History:  Diagnosis Date  . Abnormal SPEP 07/26/2016  . Arthritis    knees, hands  . Back pain 07/14/2016  . Bowel obstruction ([place]) 11/2014  . C. difficile diarrhea   . CHF (congestive heart failure) ([place])   . Congestive heart failure ([place]) 02/24/2015   S/p pacemaker  . Depression   . Elevated sed rate 08/15/2017  . Epigastric pain 03/22/2017  . GERD (gastroesophageal reflux disease)   . Hyperlipidemia   . Hypertension   . 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 Low back pain 07/14/2016  . 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 Myalgia 12/31/2016  . Obstructive sleep apnea 03/31/2015  . SOB (shortness of breath) 04/09/2016  . Stroke [facility])    TIAs    Past Surgical History:  Procedure Laterality Date  . ABDOMINAL HYSTERECTOMY     menorraghia, 2006, total  . CHOLECYSTECTOMY    . KNEE SURGERY     right, repair torn torn cartialage  . PACEMAKER INSERTION  08/2014  . TONSILLECTOMY    . TUBAL LIGATION      Family History  Problem Relation Age of Onset  . Diabetes Mother   . Hypertension Mother   . Heart disease Mother        s/p 1 stent  . Hyperlipidemia Mother   . Arthritis Mother   . Cancer Father        COLON  . Colon cancer Father 68  . Irritable bowel syndrome Sister   . Hyperlipidemia Daughter   . Hypertension Daughter   . Hypertension Maternal Grandmother   . Arthritis Maternal Grandmother   . Heart  disease Maternal Grandfather        MI  . Hypertension Maternal Grandfather   . Arthritis Maternal Grandfather   . Hypertension Son     Social History   Socioeconomic History  . Marital status: Single    Spouse name: [name]  . Number of children: [name]  . Years of education: [name]  . Highest education level: [name]  Occupational History  . [name]  Social Needs  . Financial resource strain: [name]  . Food insecurity:    Worry: [name]    Inability: [name]  . Transportation needs:    Medical: [name]    Non-medical: [name]  Tobacco Use  . Smoking status: Never Smoker  . Smokeless tobacco: Never Used  Substance and Sexual Activity  . Alcohol use: No    Alcohol/week: 0.0 standard drinks  . Drug use: No  . Sexual activity: [name]  Lifestyle  . Physical activity:    Days per week: [name]    Minutes per session: [name]  . Stress: [name]  Relationships  . Social connections:    Talks on phone: [name]  Gets together: [name]    Attends religious service: [name]    Active member of club or organization: [name]    Attends meetings of clubs or organizations: [name]    Relationship status: [name]  . Intimate partner violence:    Fear of current or ex partner: [name]    Emotionally abused: [name]    Physically abused: [name]    Forced sexual activity: [name]  Other Topics Concern  . [name]  Social History Narrative  . [name]    Outpatient Medications Prior to Visit  Medication Sig Dispense Refill  . acetaminophen (TYLENOL) 325 MG tablet Take 650 mg by mouth.    Marland Kitchen albuterol (PROVENTIL HFA;VENTOLIN HFA) 108 (90 Base) MCG/ACT inhaler Inhale 2 puffs into the lungs every 6 (six) hours as needed for wheezing or shortness of breath. 1 Inhaler 1  . amLODipine (NORVASC) 10 MG tablet Take 1 tablet (10 mg total) by mouth daily. 90 tablet 1  . aspirin  EC 81 MG tablet Take 1 tablet (81 mg total) by mouth daily. 90 tablet 1  . carvedilol (COREG) 25 MG tablet Take 1 tablet (25 mg total) by mouth 2 (two) times daily with a meal. 180 tablet 1  . cephALEXin (KEFLEX) 500 MG capsule Take 1 capsule (500 mg total) by mouth 2 (two) times daily. 10 capsule 0  . conjugated estrogens (PREMARIN) vaginal cream Place vaginally.    . dicyclomine (BENTYL) 20 MG tablet Take 1 tablet (20 mg total) by mouth 3 (three) times daily as needed for spasms. 90 tablet 1  . fluticasone (FLONASE) 50 MCG/ACT nasal spray Place 2 sprays into both nostrils daily. 16 g 6  . HYDROcodone-acetaminophen (NORCO/VICODIN) 5-325 MG tablet Take 1 tablet by mouth every 4 (four) hours as needed. 15 tablet 0  . levocetirizine (XYZAL) 5 MG tablet Take 1 tablet (5 mg total) by mouth every evening. 30 tablet 2  . lidocaine (LIDODERM) 5 % Place 1 patch onto the skin daily. Remove & Discard patch within 12 hours or as directed by MD 30 patch 2  . ondansetron (ZOFRAN) 4 MG tablet Take 1 tablet (4 mg total) by mouth every 8 (eight) hours as needed for nausea or vomiting. 30 tablet 1  . pantoprazole (PROTONIX) 40 MG tablet Take 1 tablet (40 mg total) by mouth 2 (two) times daily. 180 tablet 1  . potassium chloride SA (KLOR-CON M20) 20 MEQ tablet Take 1 tablet (20 mEq total) by mouth daily. 90 tablet 0  . sucralfate (CARAFATE) 1 g tablet Take 1 tablet (1 g total) by mouth 4 (four) times daily -  with meals and at bedtime. 120 tablet 0  . topiramate (TOPAMAX) 25 MG tablet Take 1 tablet (25 mg total) by mouth 2 (two) times daily. 180 tablet 0  . Cholecalciferol (VITAMIN D3) 5000 UNITS CAPS Take 1 capsule by mouth daily.    Marland Kitchen docusate sodium (COLACE) 50 MG capsule Take 50 mg by mouth 2 (two) times daily.    . ergocalciferol (VITAMIN D2) 50000 units capsule Take 1 capsule (50,000 Units total) by mouth once a week. 12 capsule 1  . escitalopram (LEXAPRO) 10 MG tablet Take 10 mg by mouth daily.    .  famotidine (PEPCID) 40 MG tablet Take 1 tablet (40 mg total) by mouth at bedtime as needed for heartburn or indigestion. 90 tablet 1  . FERROCITE  324 MG TABS tablet Take 324 mg by mouth daily.  0  . ferrous fumarate (HEMOCYTE - 106 MG FE) 325 (106 Fe) MG TABS tablet Take 1 tablet (106 mg of iron total) by mouth 2 (two) times daily. 60 tablet 0  . HYDROcodone-homatropine (HYCODAN) 5-1.5 MG/5ML syrup Take 5 mLs by mouth every 6 (six) hours as needed for cough. 140 mL 0  . Menthol (BENGAY ULTRA STRENGTH) 5 % PTCH Apply 1 patch topically daily. 15 patch 1  . metoCLOPramide (REGLAN) 10 MG tablet Take 1 tablet (10 mg total) by mouth 4 (four) times daily -  before meals and at bedtime. 60 tablet 4  . modafinil (PROVIGIL) 100 MG tablet Take 1 tablet (100 mg total) by mouth daily. 30 tablet 2  . Multiple Vitamins-Minerals (CENTRUM SILVER ADULT 50+ PO) Take by mouth.    . ranitidine (ZANTAC) 150 MG capsule Take 1 capsule (150 mg total) by mouth 2 (two) times daily. 60 capsule 0  . triamterene-hydrochlorothiazide (MAXZIDE-25) 37.5-25 MG tablet Take 1 tablet by mouth daily. 90 tablet 1  . vancomycin (VANCOCIN) 125 MG capsule Take 1 capsule po q6hr x 14 days Then take 1 capsule po x 7 days then take 1 capsule every 3 days for 8 weeks 81 capsule 0  . zolpidem (AMBIEN) 10 MG tablet Take 10 mg by mouth at bedtime as needed for sleep.     No facility-administered medications prior to visit.     Allergies  Allergen Reactions  . Benazepril Anaphylaxis and Swelling    angioedema Throat and lip swelling  . Codeine Nausea And Vomiting  . Morphine     Redness and hives noted post IV admin on 07/05/17  . Ondansetron Hcl     Redness and hives post IV admin on 07/05/17    Review of Systems  Constitutional: Negative for fever and malaise/fatigue.  HENT: Negative for congestion.   Eyes: Negative for blurred vision.  Respiratory: Negative for shortness of breath.   Cardiovascular: Negative for chest pain,  palpitations and leg swelling.  Gastrointestinal: Negative for abdominal pain, blood in stool and nausea.  Genitourinary: Positive for hematuria. Negative for dysuria and frequency.  Musculoskeletal: Positive for back pain and falls.  Skin: Negative for rash.  Neurological: Positive for focal weakness. Negative for dizziness, loss of consciousness and headaches.  Endo/Heme/Allergies: Negative for environmental allergies.  Psychiatric/Behavioral: Negative for depression. The patient is not nervous/anxious.        Objective:    Physical Exam  Constitutional: She is oriented to person, place, and time. She appears well-developed and well-nourished. No distress.  HENT:  Head: Normocephalic and atraumatic.  Nose: Nose normal.  Eyes: Right eye exhibits no discharge. Left eye exhibits no discharge.  Neck: Normal range of motion. Neck supple.  Cardiovascular: Normal rate and regular rhythm.  No murmur heard. Pulmonary/Chest: Effort normal and breath sounds normal.  Abdominal: Soft. Bowel sounds are normal. There is no tenderness.  Musculoskeletal: She exhibits tenderness. She exhibits no edema.  Tender with palpation over lower lumbar spine.   Neurological: She is alert and oriented to person, place, and time.  Skin: Skin is warm and dry.  Psychiatric: She has a normal mood and affect.  Nursing note and vitals reviewed.   BP 132/82 (BP Location: Left Arm, Patient Position: Sitting, Cuff Size: Normal)   Pulse 78   Temp 97.8 F (36.6 C) (Oral)   Resp 18   Ht '5\' 4"'$  (1.626 m)   Wt 176 lb  3.2 oz (79.9 kg)   SpO2 94%   BMI 30.24 kg/m  Wt Readings from Last 3 Encounters:  07/07/18 176 lb 3.2 oz (79.9 kg)  07/06/18 176 lb (79.8 kg)  07/01/18 173 lb (78.5 kg)     Lab Results  Component Value Date   WBC 2.6 (L) 07/07/2018   HGB 10.5 (L) 07/07/2018   HCT 31.8 (L) 07/07/2018   PLT 119.0 (L) 07/07/2018   GLUCOSE 99 07/07/2018   CHOL 137 02/15/2018   TRIG 100.0 02/15/2018   HDL  45.90 02/15/2018   LDLCALC 72 02/15/2018   ALT 11 07/07/2018   AST 22 07/07/2018   NA 138 07/07/2018   K 3.8 07/07/2018   CL 104 07/07/2018   CREATININE 0.83 07/07/2018   BUN 18 07/07/2018   CO2 27 07/07/2018   TSH 1.79 02/15/2018   INR 0.99 11/17/2017    Lab Results  Component Value Date   TSH 1.79 02/15/2018   Lab Results  Component Value Date   WBC 2.6 (L) 07/07/2018   HGB 10.5 (L) 07/07/2018   HCT 31.8 (L) 07/07/2018   MCV 86.4 07/07/2018   PLT 119.0 (L) 07/07/2018   Lab Results  Component Value Date   NA 138 07/07/2018   K 3.8 07/07/2018   CHLORIDE 108 12/31/2016   CO2 27 07/07/2018   GLUCOSE 99 07/07/2018   BUN 18 07/07/2018   CREATININE 0.83 07/07/2018   BILITOT 0.3 07/07/2018   ALKPHOS 39 07/07/2018   AST 22 07/07/2018   ALT 11 07/07/2018   PROT 9.7 (H) 07/07/2018   ALBUMIN 3.4 (L) 07/07/2018   CALCIUM 9.1 07/07/2018   ANIONGAP 10 07/06/2018   EGFR >90 12/31/2016   GFR 88.98 07/07/2018   Lab Results  Component Value Date   CHOL 137 02/15/2018   Lab Results  Component Value Date   HDL 45.90 02/15/2018   Lab Results  Component Value Date   LDLCALC 72 02/15/2018   Lab Results  Component Value Date   TRIG 100.0 02/15/2018   Lab Results  Component Value Date   CHOLHDL 3 02/15/2018   No results found for: HGBA1C     Assessment & Plan:   Problem List Items Addressed This Visit    Hypertension - Primary   Relevant Orders   Comprehensive metabolic panel (Completed)   CBC (Completed)   Low back pain    She fell a copule days prio to visit and has already been seen and xrayed but pain continues to be debilitating and she is noting some numbness and weakness in the right leg. Will proceed with CT of back due to pacer in place      Hematuria    Was seen at [facility] clinic and diagnosed with UTI. She is on an antibiotics. Urine culture today is negative.       Other Visit Diagnoses    Recurrent falls       Relevant Orders   Ambulatory  referral to Physical Therapy   Needs flu shot       Relevant Orders   Flu Vaccine QUAD 6+ mos PF IM (Fluarix Quad PF) (Completed)      I have discontinued Lannette Donath docusate sodium, Vitamin D3, Multiple Vitamins-Minerals (CENTRUM SILVER ADULT 50+ PO), ergocalciferol, metoCLOPramide, escitalopram, zolpidem, triamterene-hydrochlorothiazide, HYDROcodone-homatropine, vancomycin, ferrous fumarate, famotidine, modafinil, FERROCITE, Menthol, and ranitidine. I am also having her start on Acidophilus. Additionally, I am having her maintain her conjugated estrogens, acetaminophen, carvedilol, amLODipine, fluticasone, pantoprazole, aspirin EC, albuterol,  dicyclomine, ondansetron, lidocaine, topiramate, potassium chloride SA, levocetirizine, sucralfate, HYDROcodone-acetaminophen, and cephALEXin.  Meds ordered this encounter  Medications  . Lactobacillus (ACIDOPHILUS) 100 MG CAPS    Sig: Take 1 capsule (100 mg total) by mouth daily.    Refill:  0     Penni Homans, MD

## 2018-07-11 NOTE — Assessment & Plan Note (Signed)
She fell a copule days prio to visit and has already been seen and xrayed but pain continues to be debilitating and she is noting some numbness and weakness in the right leg. Will proceed with CT of back due to pacer in place

## 2018-07-13 ENCOUNTER — Ambulatory Visit: Payer: Medicare Other | Admitting: Physical Therapy

## 2018-07-13 ENCOUNTER — Other Ambulatory Visit: Payer: Self-pay | Admitting: Oncology

## 2018-07-13 DIAGNOSIS — D72819 Decreased white blood cell count, unspecified: Secondary | ICD-10-CM | POA: Diagnosis not present

## 2018-07-13 DIAGNOSIS — D472 Monoclonal gammopathy: Secondary | ICD-10-CM

## 2018-07-13 DIAGNOSIS — C9000 Multiple myeloma not having achieved remission: Secondary | ICD-10-CM | POA: Insufficient documentation

## 2018-07-13 DIAGNOSIS — Z79899 Other long term (current) drug therapy: Secondary | ICD-10-CM | POA: Diagnosis not present

## 2018-07-13 DIAGNOSIS — Z5112 Encounter for antineoplastic immunotherapy: Secondary | ICD-10-CM | POA: Insufficient documentation

## 2018-07-13 DIAGNOSIS — N281 Cyst of kidney, acquired: Secondary | ICD-10-CM | POA: Diagnosis not present

## 2018-07-13 DIAGNOSIS — E7800 Pure hypercholesterolemia, unspecified: Secondary | ICD-10-CM | POA: Diagnosis not present

## 2018-07-13 DIAGNOSIS — R3129 Other microscopic hematuria: Secondary | ICD-10-CM | POA: Diagnosis not present

## 2018-07-14 DIAGNOSIS — K319 Disease of stomach and duodenum, unspecified: Secondary | ICD-10-CM | POA: Diagnosis not present

## 2018-07-14 DIAGNOSIS — R1013 Epigastric pain: Secondary | ICD-10-CM | POA: Diagnosis not present

## 2018-07-14 DIAGNOSIS — R112 Nausea with vomiting, unspecified: Secondary | ICD-10-CM | POA: Diagnosis not present

## 2018-07-14 LAB — UPEP/UIFE/LIGHT CHAINS/TP, 24-HR UR
% BETA, Urine: 23.6 %
ALPHA 1 URINE: 3.7 %
ALPHA 2 UR: 22.1 %
Albumin, U: 25.4 %
FREE KAPPA LT CHAINS, UR: 84 mg/L — AB (ref 1.35–24.19)
FREE LAMBDA LT CHAINS, UR: 4.69 mg/L (ref 0.24–6.66)
Free Kappa/Lambda Ratio: 17.91 — ABNORMAL HIGH (ref 2.04–10.37)
GAMMA GLOBULIN URINE: 25.2 %
M-SPIKE %, URINE: 6.2 % — AB
M-SPIKE, MG/24 HR: 6 mg/(24.h) — AB
TOTAL PROTEIN, URINE-UR/DAY: 102 mg/(24.h) (ref 30–150)
TOTAL VOLUME: 1050
Total Protein, Urine: 9.7 mg/dL

## 2018-07-15 ENCOUNTER — Telehealth: Payer: Self-pay | Admitting: *Deleted

## 2018-07-15 NOTE — Telephone Encounter (Signed)
Pt notified per order of Dr. Marin Olp that urine shows a moderate amount of abnormal protein and that Dr Marin Olp may need to start therapy when she is seen back in office.  Patient appreciative of information and call and is aware of scheduled appt on 07/19/18 to see Dr. Marin Olp.

## 2018-07-15 NOTE — Telephone Encounter (Signed)
-----   Message from [name], MD sent at 07/14/2018  4:40 PM EDT ----- Cal - urine shows a moderate amount of abnormal protein.  We may need to start therapy when I see you back!!  Laurey Arrow

## 2018-07-18 ENCOUNTER — Encounter: Payer: Self-pay | Admitting: Family Medicine

## 2018-07-19 ENCOUNTER — Other Ambulatory Visit: Payer: Self-pay

## 2018-07-19 ENCOUNTER — Encounter: Payer: Self-pay | Admitting: Physical Therapy

## 2018-07-19 ENCOUNTER — Ambulatory Visit: Payer: Medicare Other | Attending: Family Medicine | Admitting: Physical Therapy

## 2018-07-19 ENCOUNTER — Other Ambulatory Visit: Payer: Self-pay | Admitting: *Deleted

## 2018-07-19 ENCOUNTER — Telehealth: Payer: Self-pay

## 2018-07-19 ENCOUNTER — Inpatient Hospital Stay: Payer: Medicare Other | Attending: Hematology & Oncology | Admitting: Hematology & Oncology

## 2018-07-19 VITALS — BP 121/77 | HR 79 | Temp 98.7°F | Resp 18 | Wt 174.2 lb

## 2018-07-19 DIAGNOSIS — R2681 Unsteadiness on feet: Secondary | ICD-10-CM | POA: Diagnosis not present

## 2018-07-19 DIAGNOSIS — C9000 Multiple myeloma not having achieved remission: Secondary | ICD-10-CM | POA: Diagnosis not present

## 2018-07-19 DIAGNOSIS — M546 Pain in thoracic spine: Secondary | ICD-10-CM | POA: Insufficient documentation

## 2018-07-19 DIAGNOSIS — M25611 Stiffness of right shoulder, not elsewhere classified: Secondary | ICD-10-CM | POA: Insufficient documentation

## 2018-07-19 DIAGNOSIS — M25511 Pain in right shoulder: Secondary | ICD-10-CM | POA: Diagnosis not present

## 2018-07-19 DIAGNOSIS — Z5112 Encounter for antineoplastic immunotherapy: Secondary | ICD-10-CM | POA: Diagnosis not present

## 2018-07-19 DIAGNOSIS — Z79899 Other long term (current) drug therapy: Secondary | ICD-10-CM | POA: Diagnosis not present

## 2018-07-19 MED ORDER — LENALIDOMIDE 20 MG PO CAPS: ORAL_CAPSULE | ORAL | 4 refills | Status: DC

## 2018-07-19 NOTE — Therapy (Signed)
[facility] [address]  [place], [place], [address] Phone: [phone]   Fax:  [phone]  Physical Therapy Evaluation  Patient Details  Name: Tami [name] MRN: [id number] Date of Birth: 1956-06-11 Referring Provider: Gwyneth Revels   Encounter Date: 07/19/2018  PT End of Session - 07/19/18 1353    Visit Number  1    Number of Visits  4    Date for PT Re-Evaluation  08/16/18    Authorization Type  [employer] Medicare & Medicaid    PT Start Time  1354    PT Stop Time  1432    PT Time Calculation (min)  38 min    Activity Tolerance  Patient tolerated treatment well    Behavior During Therapy  [facility] for tasks assessed/performed       Past Medical History:  Diagnosis Date  . Abnormal SPEP 07/26/2016  . Arthritis    knees, hands  . Back pain 07/14/2016  . Bowel obstruction ([place]) 11/2014  . C. difficile diarrhea   . CHF (congestive heart failure) ([place])   . Congestive heart failure ([place]) 02/24/2015   S/p pacemaker  . Depression   . Elevated sed rate 08/15/2017  . Epigastric pain 03/22/2017  . GERD (gastroesophageal reflux disease)   . Hyperlipidemia   . Hypertension   . 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 Low back pain 07/14/2016  . 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 Myalgia 12/31/2016  . Obstructive sleep apnea 03/31/2015  . SOB (shortness of breath) 04/09/2016  . Stroke [facility])    TIAs    Past Surgical History:  Procedure Laterality Date  . ABDOMINAL HYSTERECTOMY     menorraghia, 2006, total  . CHOLECYSTECTOMY    . KNEE SURGERY     right, repair torn torn cartialage  . PACEMAKER INSERTION  08/2014  . TONSILLECTOMY    . TUBAL LIGATION      There were no vitals filed for this visit.   Subjective Assessment - 07/19/18 1356    Subjective  Patient went to a reunion 07/01/18 and fell on her left hand. Patient reports she missed a step coming down an embankment and fell backward.  Patient just returned from her MD and her blood cell counts are low so she will need to start taking treatments for it. She denies any ofther falls.    Pertinent History  back pain, CHF, pacemaker, epigastric pain, GERD, HLD, HTN, myalgia, stroke, R knee surgery    Diagnostic tests  02/22/18 xray thoracic: No acute bony abnormality.  Rightward scoliosis. 02/05/18 CT lumbar: Unchanged lumbar disc and facet degeneration, most notable at L4-5 where there is grade 1 anterolisthesis and mild-to-moderate neural foraminal stenosis.    Currently in Pain?  No/denies         [facility] PT Assessment - 07/19/18 0001      Assessment   Medical Diagnosis  recurrent falls    Referring Provider  Gwyneth Revels    Onset Date/Surgical Date  05/31/18    Next MD Visit  07/31/18    Prior Therapy  yes for back      Balance Screen   Has the patient fallen in the past 6 months  Yes    How many times?  1    Has the patient had a decrease in activity level because of a fear of falling?   No    Is the patient reluctant to leave their home because of a  fear of falling?   No      Prior Function   Level of Independence  Independent    Vocation  Retired      Functional Tests   Functional tests  Lunges;Step up      Lunges   Comments  partial lunge with bil UE bil; greater weakness on L      Step Up   Comments  bil weakness with step ups      Posture/Postural Control   Posture Comments  R scapular winging      ROM / Strength   AROM / PROM / Strength  Strength      Strength   Overall Strength Comments  able to bridge    Right Hip Flexion  4+/5    Left Hip Flexion  4/5    Right Knee Flexion  5/5    Right Knee Extension  5/5    Left Knee Flexion  5/5    Left Knee Extension  5/5      Flexibility   Soft Tissue Assessment /Muscle Length  yes   bil gastroc tightness     Balance   Balance Assessed  Yes      Standardized Balance Assessment   Standardized Balance Assessment  Berg Balance Test;Dynamic Gait  Index      Berg Balance Test   Sit to Stand  Able to stand without using hands and stabilize independently    Standing Unsupported  Able to stand safely 2 minutes    Sitting with Back Unsupported but Feet Supported on Floor or Stool  Able to sit safely and securely 2 minutes    Stand to Sit  Sits safely with minimal use of hands    Transfers  Able to transfer safely, minor use of hands    Standing Unsupported with Eyes Closed  Able to stand 10 seconds safely    Standing Ubsupported with Feet Together  Able to place feet together independently and stand 1 minute safely    From Standing, Reach Forward with Outstretched Arm  Can reach confidently >25 cm (10")    From Standing Position, Pick up Object from Floor  Able to pick up shoe safely and easily    From Standing Position, Turn to Look Behind Over each Shoulder  Looks behind from both sides and weight shifts well    Turn 360 Degrees  Able to turn 360 degrees safely in 4 seconds or less    Standing Unsupported, Alternately Place Feet on Step/Stool  Able to stand independently and safely and complete 8 steps in 20 seconds    Standing Unsupported, One Foot in Front  Able to place foot tandem independently and hold 30 seconds    Standing on One Leg  Able to lift leg independently and hold equal to or more than 3 seconds   RLE; LLE = 10 sec   Total Score  54      Dynamic Gait Index   Level Surface  Normal    Change in Gait Speed  Normal    Gait with Horizontal Head Turns  Normal    DGI comment:  partial completed                Objective measurements completed on examination: See above findings.              PT Education - 07/19/18 1446    Education Details  HEP    Person(s) Educated  Patient    Methods  Explanation;Demonstration;Handout    Comprehension  Verbalized understanding;Returned demonstration       PT Short Term Goals - 05/23/18 1659      PT SHORT TERM GOAL #1   Title  Patient to be independent with  initial HEP.    Time  3    Period  Weeks    Status  Achieved        PT Long Term Goals - 07/19/18 1452      PT LONG TERM GOAL #1   Title  Patient to be independent with advanced HEP.    Time  4    Period  Weeks    Status  New      PT LONG TERM GOAL #2   Title  Patient able to stand on LLE for 10 seconds to improve balance    Time  4    Period  Weeks    Status  New      PT LONG TERM GOAL #3   Title  Patient able to perform step ups without UE support    Time  4    Period  Weeks    Status  New             Plan - 07/19/18 1447    Clinical Impression Statement  Patient presents for low complexity evaluation for balance. She has had one recent fall however it was more lack of footing. She does have tight gastrocs bil. She did well with balance assessment except for SLS. She does demonstrate functional weakness bilaterally with step ups and lunges and would benefit from PT to address these deficits.    History and Personal Factors relevant to plan of care:  back pain, CHF, pacemaker, epigastric pain, GERD, HLD, HTN, myalgia, stroke, R knee surgery    Clinical Presentation  Stable    Clinical Decision Making  Low    Rehab Potential  Excellent    Clinical Impairments Affecting Rehab Potential  pacemaker, CHF, GERD, HLD, HTN, stroke, R knee surgery    PT Frequency  1x / week    PT Duration  4 weeks    PT Treatment/Interventions  Therapeutic exercise;Balance training;Manual techniques    PT Next Visit Plan  issue gastroc/soleus stretches prn; work SLS; balance on unlevel surfaces, steps ups, functional LE strength    PT Home Exercise Plan  step ups, SLS at counter, sit to stand    Consulted and Agree with Plan of Care  Patient       Patient will benefit from skilled therapeutic intervention in order to improve the following deficits and impairments:  Impaired flexibility, Decreased balance, Decreased strength  Visit Diagnosis: Unsteadiness on feet - Plan: PT plan of care  cert/re-cert     Problem List Patient Active Problem List   Diagnosis Date Noted  . Hematuria 07/11/2018  . Seasonal allergies 06/23/2018  . Right shoulder pain 06/07/2018  . Neck pain 06/07/2018  . Paresthesias 06/07/2018  . 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 Pain in thoracic spine 03/18/2018  . Thoracic back pain 02/15/2018  . Bronchitis 02/15/2018  . 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 Pneumonia 11/11/2017  . Cystitis 10/25/2017  . C. difficile colitis 09/15/2017  . Hypokalemia 08/20/2017  . Anemia 08/20/2017  . Elevated sed rate 08/15/2017  . 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 Epigastric pain 03/22/2017  . Myalgia 12/31/2016  . 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 SOB (shortness of breath) 07/26/2016  .  Abnormal SPEP 07/26/2016  . Low back pain 07/14/2016  . Insomnia 12/29/2015  . Tension headache 10/21/2015  . Muscle spasms of neck 10/18/2015  . Dysuria 10/13/2015  . Sinusitis 07/07/2015  . AP (abdominal pain) 07/07/2015  . Cardiomyopathy ([place]) 04/19/2015  . Chest wall pain 04/19/2015  . Obstructive sleep apnea 03/31/2015  . Congestive heart failure ([place]) 02/24/2015  . Hyperlipidemia   . Hypertension   . Depression   . Stroke ([place])   . GERD (gastroesophageal reflux disease)   . Bowel obstruction ([place]) 11/09/2014    Kaytlyn Din PT 07/19/2018, 2:58 PM  [facility] [address]  [place], [place], [address] Phone: [phone]   Fax:  [phone]  Name: Tami [name] MRN: [id number] Date of Birth: 16-Apr-1956

## 2018-07-19 NOTE — Progress Notes (Signed)
Hematology and Oncology Follow Up Visit  Tami [name] [id number] Aug 24, 1956 62 y.o. 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   Unfortunately, I think that we are going to have to start to treat her.  Her myeloma studies clearly are going up.  When I saw her, the M spike was up to 3.2 g/dL.  Her IgG level was 5060 mg/dL.  Her Kappa light chain was 12.6 mg/dL.  I think we are going to have to get a bone marrow biopsy on her.  I really need to see what the bone marrow looks like.  I talked to her about this.  She understands and agrees.  She is had no fever.  She still has abdominal issues.  She did have a colonoscopy.  I am not sure if she has had an upper endoscopy.  She has had no bony pain.  I thought she is post to have a PET scan done, but this is not been done yet.  I will see about getting this ordered.  She had 24-hour urine done last week.  She had 84 mg/L of Kappa Lightchain.  I think we are going to have to get going with treatment.  I talked to her about starting Revlimid/Velcade/Decadron.  I think this would be a very good regimen for her.  I do not think she has bad cytogenetics.  We will find this out with the bone marrow test.  Overall, her performance status is ECOG 1.    Medications:  Current Outpatient Medications:  .  acetaminophen (TYLENOL) 325 MG tablet, Take 650 mg by mouth., Disp: , Rfl:  .  albuterol (PROVENTIL HFA;VENTOLIN HFA) 108 (90 Base) MCG/ACT inhaler, Inhale 2 puffs into the lungs every 6 (six) hours as needed for wheezing or shortness of breath., Disp: 1 Inhaler, Rfl: 1 .  amLODipine (NORVASC) 10 MG tablet, Take 1 tablet (10 mg total) by mouth daily., Disp: 90 tablet, Rfl: 1 .  aspirin EC 81 MG tablet, Take 1 tablet (81 mg total) by mouth daily., Disp: 90 tablet, Rfl: 1 .  carvedilol (COREG) 25 MG tablet, Take  1 tablet (25 mg total) by mouth 2 (two) times daily with a meal., Disp: 180 tablet, Rfl: 1 .  cephALEXin (KEFLEX) 500 MG capsule, Take 1 capsule (500 mg total) by mouth 2 (two) times daily., Disp: 10 capsule, Rfl: 0 .  conjugated estrogens (PREMARIN) vaginal cream, Place vaginally., Disp: , Rfl:  .  dicyclomine (BENTYL) 20 MG tablet, Take 1 tablet (20 mg total) by mouth 3 (three) times daily as needed for spasms., Disp: 90 tablet, Rfl: 1 .  fluticasone (FLONASE) 50 MCG/ACT nasal spray, Place 2 sprays into both nostrils daily., Disp: 16 g, Rfl: 6 .  HYDROcodone-acetaminophen (NORCO/VICODIN) 5-325 MG tablet, Take 1 tablet by mouth every 4 (four) hours as needed., Disp: 15 tablet, Rfl: 0 .  Lactobacillus (ACIDOPHILUS) 100 MG CAPS, Take 1 capsule (100 mg total) by mouth daily., Disp: , Rfl: 0 .  levocetirizine (XYZAL) 5 MG tablet, Take 1 tablet (5 mg total) by mouth every evening., Disp: 30 tablet, Rfl: 2 .  lidocaine (LIDODERM) 5 %, Place 1 patch onto the skin daily. Remove & Discard patch within 12 hours or as directed by MD, Disp: 30 patch, Rfl: 2 .  ondansetron (ZOFRAN) 4 MG  tablet, Take 1 tablet (4 mg total) by mouth every 8 (eight) hours as needed for nausea or vomiting., Disp: 30 tablet, Rfl: 1 .  pantoprazole (PROTONIX) 40 MG tablet, Take 1 tablet (40 mg total) by mouth 2 (two) times daily., Disp: 180 tablet, Rfl: 1 .  potassium chloride SA (KLOR-CON M20) 20 MEQ tablet, Take 1 tablet (20 mEq total) by mouth daily., Disp: 90 tablet, Rfl: 0 .  sucralfate (CARAFATE) 1 g tablet, Take 1 tablet (1 g total) by mouth 4 (four) times daily -  with meals and at bedtime., Disp: 120 tablet, Rfl: 0 .  topiramate (TOPAMAX) 25 MG tablet, Take 1 tablet (25 mg total) by mouth 2 (two) times daily., Disp: 180 tablet, Rfl: 0  Allergies:  Allergies  Allergen Reactions  . Benazepril Anaphylaxis and Swelling    angioedema Throat and lip swelling  . Codeine Nausea And Vomiting  . Morphine     Redness and hives  noted post IV admin on 07/05/17  . Ondansetron Hcl     Redness and hives post IV admin on 07/05/17    Past Medical History, Surgical history, Social history, and Family History were reviewed and updated.  Review of Systems: Review of Systems  Constitutional: Negative.   HENT: Negative.   Eyes: Negative.   Respiratory: Negative.   Cardiovascular: Negative.   Gastrointestinal: Negative.   Genitourinary: Negative.   Musculoskeletal: Negative.   Skin: Negative.   Neurological: Negative.   Endo/Heme/Allergies: Negative.   Psychiatric/Behavioral: Negative.      Physical Exam:  weight is 174 lb 4 oz (79 kg). Her oral temperature is 98.7 F (37.1 C). Her blood pressure is 121/77 and her pulse is 79. Her respiration is 18.   Wt Readings from Last 3 Encounters:  07/19/18 174 lb 4 oz (79 kg)  07/07/18 176 lb 3.2 oz (79.9 kg)  07/06/18 176 lb (79.8 kg)     Physical Exam  Constitutional: She is oriented to person, place, and time.  HENT:  Head: Normocephalic and atraumatic.  Mouth/Throat: Oropharynx is clear and moist.  Eyes: Pupils are equal, round, and reactive to light. EOM are normal.  Neck: Normal range of motion.  Cardiovascular: Normal rate, regular rhythm and normal heart sounds.  Pulmonary/Chest: Effort normal and breath sounds normal.  Abdominal: Soft. Bowel sounds are normal.  Musculoskeletal: Normal range of motion. She exhibits no edema, tenderness or deformity.  Lymphadenopathy:    She has no cervical adenopathy.  Neurological: She is alert and oriented to person, place, and time.  Skin: Skin is warm and dry. No rash noted. No erythema.  Psychiatric: She has a normal mood and affect. Her behavior is normal. Judgment and thought content normal.  Vitals reviewed.  a.   Lab Results  Component Value Date   WBC 2.6 (L) 07/07/2018   HGB 10.5 (L) 07/07/2018   HCT 31.8 (L) 07/07/2018   MCV 86.4 07/07/2018   PLT 119.0 (L) 07/07/2018     Chemistry        Component Value Date/Time   NA 138 07/07/2018 1704   NA 143 11/08/2017 1127   NA 140 12/31/2016 1000   K 3.8 07/07/2018 1704   K 4.0 11/08/2017 1127   K 3.6 12/31/2016 1000   CL 104 07/07/2018 1704   CL 107 11/08/2017 1127   CO2 27 07/07/2018 1704   CO2 25 11/08/2017 1127   CO2 24 12/31/2016 1000   BUN 18 07/07/2018 1704   BUN 19 11/08/2017 1127  BUN 22.3 12/31/2016 1000   CREATININE 0.83 07/07/2018 1704   CREATININE 0.83 07/01/2018 0857   CREATININE 0.9 11/08/2017 1127   CREATININE 0.8 12/31/2016 1000      Component Value Date/Time   CALCIUM 9.1 07/07/2018 1704   CALCIUM 9.9 11/08/2017 1127   CALCIUM 9.5 12/31/2016 1000   ALKPHOS 39 07/07/2018 1704   ALKPHOS 44 11/08/2017 1127   ALKPHOS 51 12/31/2016 1000   AST 22 07/07/2018 1704   AST 21 07/01/2018 0857   AST 28 12/31/2016 1000   ALT 11 07/07/2018 1704   ALT 12 07/01/2018 0857   ALT 36 11/08/2017 1127   ALT 16 12/31/2016 1000   BILITOT 0.3 07/07/2018 1704   BILITOT 0.4 07/01/2018 0857   BILITOT 0.46 12/31/2016 1000         Impression and Plan: Ms. Beckers is a 62 year old African-American female.  We now have a diagnosis of myeloma on her.  We will have to move ahead and get her started on treatment.  I think that treatment will work.  I am not sure that she would be considered a transplant candidate.  Again, we do not have a lot of options that we can consider.  I gave her information about the Revlimid/Velcade/Decadron protocol.  I think this would be well-tolerated.  She will start full dose aspirin.  She will take coated aspirin.  She also will start Famvir for shingles prophylaxis.    I will try to get started in early October.  We will also need to get a PET scan on her to make sure that there is no active bone lesions.  We will get the bone marrow set up for her.  I will make sure her cytogenetics are sent.  I probably will see her back sometime in October so that we can make sure everything is  finalized for treatment.  I spent about 50 minutes with her.  All the time spent face-to-face.  I counseled her and help coordinate all of her care for chemotherapy.      [name], MD 9/10/201912:55 PM

## 2018-07-19 NOTE — Telephone Encounter (Signed)
Bridgett, front end,spoke with pt. And directed her to lab. Stool cards requested per letter sent out 8/30 by [facility], CMA, can be dropped off to our lab and then sent to [facility] lab for processing. No other concerns at this time.

## 2018-07-19 NOTE — Telephone Encounter (Signed)
Copied from [place] [id number]. Topic: Quick Communication - See Telephone Encounter >> Jul 19, 2018  9:07 AM Antonieta Iba C wrote: CRM for notification. See Telephone encounter for: 07/19/18.  Pt called in to schedule an apt w/ PCP.  Pt says that she has been experiencing frequent bowl movements. Pt says that she is dropping off a stool kit today and would like to see PCP as soon as possible. Not showing openings soon with PCP. Please advise/assist.    CB: [place] [phone]     -Bridgette, Can you let patient know we can not accept stool kits in the office she will need to be seen or go to the ER.

## 2018-07-19 NOTE — Patient Instructions (Signed)
Sit to Stand    Sit on edge of chair, feet flat on floor. Stand upright, extending knees fully. Repeat 10 times per set. Do _1-3 sets per session. Do __1-2__ sessions per day.    Proprioception, Quad Strength, Timing, Coordination: Forward Step-Up    Move onto step with left foot, then the other. Step back down with __Right_ foot.  Repeat on the other leg. Use ____ inch step. Repeat _10-30___ times or for ____ minutes. Do __1-2__ sessions per day.  http://cc.exer.us/4   POSITION: Single Leg Balance    Stand on right leg. DO NOT TILT HEAD. Hold _10_ seconds. ___ reps __2_ times per day.  http://ggbe.exer.us/15   Copyright  VHI. All rights reserved.    Madelyn Flavors, PT 07/19/18 2:32 PM

## 2018-07-20 ENCOUNTER — Telehealth: Payer: Self-pay | Admitting: Pharmacist

## 2018-07-20 ENCOUNTER — Encounter: Payer: Self-pay | Admitting: Hematology & Oncology

## 2018-07-20 ENCOUNTER — Telehealth: Payer: Self-pay | Admitting: Pharmacy Technician

## 2018-07-20 DIAGNOSIS — C9000 Multiple myeloma not having achieved remission: Secondary | ICD-10-CM | POA: Insufficient documentation

## 2018-07-20 HISTORY — DX: Multiple myeloma not having achieved remission: C90.00

## 2018-07-20 MED ORDER — FAMCICLOVIR 250 MG PO TABS: 250.0000 mg | ORAL_TABLET | Freq: Every day | ORAL | 12 refills | Status: DC

## 2018-07-20 NOTE — Progress Notes (Signed)
START ON PATHWAY REGIMEN - Multiple Myeloma and Other Plasma Cell Dyscrasias     A cycle is every 21 days:     Bortezomib      Lenalidomide      Dexamethasone   **Always confirm dose/schedule in your pharmacy ordering system**  Patient Characteristics: Newly Diagnosed, Transplant Ineligible or Refused, Standard Risk R-ISS Staging: III Disease Classification: Newly Diagnosed Is Patient Eligible for Transplant<= Transplant Ineligible or Refused Risk Status: Standard Risk Intent of Therapy: Non-Curative / Palliative Intent, Discussed with Patient

## 2018-07-20 NOTE — Telephone Encounter (Signed)
Oral Oncology Patient Advocate Encounter  Received notification from [facility] Medicare that prior authorization for Revlimid is required.  PA submitted on CoverMyMeds Key [id number] Status is pending  Oral Oncology Clinic will continue to follow.  [place] [facility] [place] Phone [phone] Fax [phone] 07/20/2018 9:44 AM

## 2018-07-20 NOTE — Telephone Encounter (Signed)
Oral Oncology Pharmacist Encounter  Received new prescription for Revlimid (lenalidomide) for the treatment of newly diagnosed multiple myeloma.  CBC/CMP from 07/06/18 assessed, no relevant lab abnormalities. Prescription dose and frequency assessed.   Current medication list in Epic reviewed, no DDIs with Revlmid identified.  Prescription has been e-scribed to [employer] for benefits analysis and approval.  Oral Oncology Clinic will continue to follow for insurance authorization, copayment issues, initial counseling and start date.  Alyson N. Leonard, PharmD, BCPS, BCOP Hematology/Oncology Clinical Pharmacist [facility] Oral Chemotherapy Navigation Clinic [phone]  07/20/2018 9:22 AM  

## 2018-07-20 NOTE — Telephone Encounter (Signed)
Oral Oncology Patient Advocate Encounter  Prior Authorization for Revlimid has been approved.    PA# [id number] Effective dates: 07/20/18 through 11/08/18  I have shared this information with [employer].  Oral Oncology Clinic will continue to follow.   [place] [facility] [place] Phone [phone] Fax [phone] 07/20/2018 10:29 AM

## 2018-07-21 ENCOUNTER — Telehealth: Payer: Self-pay | Admitting: *Deleted

## 2018-07-21 NOTE — Telephone Encounter (Signed)
Patient has received her revlimid. She wants to know when she should start.   Reviewed with Dr Marin Olp. He would like patient to go ahead and start now.  Reviewed with patient to start today. Suggested that she take medication prior to bedtime to help with any possible side effects. Reviewed potential side effects and to call the office with any difficulty. At this time patient states she doesn't need antiemetics prescribed, but will call if she develops nausea.   Reviewed good nutrition including eating multiple small meals, high protein foods and use of supplements. Made sure she understood that healthy eating was important and that loosing any weight while on treatment would be detrimental. She agrees to try and keep up nutrition and will call the office with any problems.

## 2018-07-21 NOTE — Telephone Encounter (Signed)
Received Medical records from [facility]; forwarded to provider/SLS 09/12

## 2018-07-25 ENCOUNTER — Encounter: Payer: Self-pay | Admitting: Family Medicine

## 2018-07-25 ENCOUNTER — Other Ambulatory Visit (INDEPENDENT_AMBULATORY_CARE_PROVIDER_SITE_OTHER): Payer: Medicare Other

## 2018-07-25 ENCOUNTER — Ambulatory Visit (INDEPENDENT_AMBULATORY_CARE_PROVIDER_SITE_OTHER): Payer: Medicare Other | Admitting: Family Medicine

## 2018-07-25 VITALS — BP 142/96 | HR 92 | Temp 98.2°F | Resp 18 | Wt 175.2 lb

## 2018-07-25 DIAGNOSIS — A0472 Enterocolitis due to Clostridium difficile, not specified as recurrent: Secondary | ICD-10-CM

## 2018-07-25 DIAGNOSIS — R109 Unspecified abdominal pain: Secondary | ICD-10-CM

## 2018-07-25 DIAGNOSIS — K625 Hemorrhage of anus and rectum: Secondary | ICD-10-CM

## 2018-07-25 DIAGNOSIS — C9000 Multiple myeloma not having achieved remission: Secondary | ICD-10-CM

## 2018-07-25 DIAGNOSIS — M545 Low back pain, unspecified: Secondary | ICD-10-CM

## 2018-07-25 DIAGNOSIS — I10 Essential (primary) hypertension: Secondary | ICD-10-CM

## 2018-07-25 DIAGNOSIS — R197 Diarrhea, unspecified: Secondary | ICD-10-CM | POA: Diagnosis not present

## 2018-07-25 DIAGNOSIS — R319 Hematuria, unspecified: Secondary | ICD-10-CM

## 2018-07-25 LAB — FECAL OCCULT BLOOD, IMMUNOCHEMICAL: FECAL OCCULT BLD: NEGATIVE

## 2018-07-25 MED ORDER — HYOSCYAMINE SULFATE 0.125 MG SL SUBL: 0.1250 mg | SUBLINGUAL_TABLET | SUBLINGUAL | 2 refills | Status: DC | PRN

## 2018-07-25 NOTE — Patient Instructions (Addendum)
[place] as needed   Diarrhea, Adult Diarrhea is when you have loose and water poop (stool) often. Diarrhea can make you feel weak and cause you to get dehydrated. Dehydration can make you tired and thirsty, make you have a dry mouth, and make it so you pee (urinate) less often. Diarrhea often lasts 2-3 days. However, it can last longer if it is a sign of something more serious. It is important to treat your diarrhea as told by your doctor. Follow these instructions at home: Eating and drinking  Follow these recommendations as told by your doctor:  Take an oral rehydration solution (ORS). This is a drink that is sold at pharmacies and stores.  Drink clear fluids, such as: ? Water. ? Ice chips. ? Diluted fruit juice. ? Low-calorie sports drinks.  Eat bland, easy-to-digest foods in small amounts as you are able. These foods include: ? Bananas. ? Applesauce. ? Rice. ? Low-fat (lean) meats. ? Toast. ? Crackers.  Avoid drinking fluids that have a lot of sugar or caffeine in them.  Avoid alcohol.  Avoid spicy or fatty foods.  General instructions   Drink enough fluid to keep your pee (urine) clear or pale yellow.  Wash your hands often. If you cannot use soap and water, use hand sanitizer.  Make sure that all people in your home wash their hands well and often.  Take over-the-counter and prescription medicines only as told by your doctor.  Rest at home while you get better.  Watch your condition for any changes.  Take a warm bath to help with any burning or pain from having diarrhea.  Keep all follow-up visits as told by your doctor. This is important. Contact a doctor if:  You have a fever.  Your diarrhea gets worse.  You have new symptoms.  You cannot keep fluids down.  You feel light-headed or dizzy.  You have a headache.  You have muscle cramps. Get help right away if:  You have chest pain.  You feel very weak or you pass out  (faint).  You have bloody or black poop or poop that look like tar.  You have very bad pain, cramping, or bloating in your belly (abdomen).  You have trouble breathing or you are breathing very quickly.  Your heart is beating very quickly.  Your skin feels cold and clammy.  You feel confused.  You have signs of dehydration, such as: ? Dark pee, hardly any pee, or no pee. ? Cracked lips. ? Dry mouth. ? Sunken eyes. ? Sleepiness. ? Weakness. This information is not intended to replace advice given to you by your health care provider. Make sure you discuss any questions you have with your health care provider. Document Released: 04/13/2008 Document Revised: 05/15/2016 Document Reviewed: 07/02/2015 Elsevier Interactive Patient Education  2018 [employer].

## 2018-07-25 NOTE — Progress Notes (Signed)
Subjective:  I acted as a Education administrator for Dr. Charlett Blake. [place], [place]  [name], female    DOB: [birth year], 62 y.o.   MRN: [id number]  No chief complaint on file.   HPI  Patient is in today for ongoing concerns with her bowel movements. She states she has had diarrhea the past few days and not getting better. No bloody or tarry stool. No feves or chills. Has several bowel movements that are loose to watery after each attempt at eating. No urinary symptoms such as urgency or dysuria. Does note abdominal pain and cramping occur intermittently. Denies CP/palp/SOB/HA/congestion/fevers or GU c/o. Taking meds as prescribed  Patient Care Team: Mosie Lukes, MD as PCP - General (Family Medicine)   Past Medical History:  Diagnosis Date  . Abnormal SPEP 07/26/2016  . Arthritis    knees, hands  . Back pain 07/14/2016  . Bowel obstruction ([place]) 11/2014  . C. difficile diarrhea   . CHF (congestive heart failure) ([place])   . Congestive heart failure ([place]) 02/24/2015   S/p pacemaker  . Depression   . Elevated sed rate 08/15/2017  . Epigastric pain 03/22/2017  . GERD (gastroesophageal reflux disease)   . Hyperlipidemia   . Hypertension   . 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 Low back pain 07/14/2016  . 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 Multiple myeloma ([place]) 07/20/2018  . Multiple myeloma not having achieved remission ([place]) 07/20/2018  . Myalgia 12/31/2016  . Obstructive sleep apnea 03/31/2015  . SOB (shortness of breath) 04/09/2016  . Stroke [facility])    TIAs    Past Surgical History:  Procedure Laterality Date  . ABDOMINAL HYSTERECTOMY     menorraghia, 2006, total  . CHOLECYSTECTOMY    . KNEE SURGERY     right, repair torn torn cartialage  . PACEMAKER INSERTION  08/2014  . TONSILLECTOMY    . TUBAL LIGATION      Family History  Problem Relation Age of Onset  . Diabetes Mother   . Hypertension Mother   . Heart disease Mother        s/p 1  stent  . Hyperlipidemia Mother   . Arthritis Mother   . Cancer Father        COLON  . Colon cancer Father 77  . Irritable bowel syndrome Sister   . Hyperlipidemia Daughter   . Hypertension Daughter   . Hypertension Maternal Grandmother   . Arthritis Maternal Grandmother   . Heart disease Maternal Grandfather        MI  . Hypertension Maternal Grandfather   . Arthritis Maternal Grandfather   . Hypertension Son     Social History   Socioeconomic History  . Marital status: Single    Spouse name: [name]  . Number of children: [name]  . Years of education: [name]  . Highest education level: [name]  Occupational History  . [name]  Social Needs  . Financial resource strain: [name]  . Food insecurity:    Worry: [name]    Inability: [name]  . Transportation needs:    Medical: [name]    Non-medical: [name]  Tobacco Use  . Smoking status: Never Smoker  . Smokeless tobacco: Never Used  Substance and Sexual Activity  . Alcohol use: No    Alcohol/week: 0.0 standard drinks  . Drug use: No  . Sexual activity: Not on  file  Lifestyle  . Physical activity:    Days per week: [name]    Minutes per session: [name]  . Stress: [name]  Relationships  . Social connections:    Talks on phone: [name]    Gets together: [name]    Attends religious service: [name]    Active member of club or organization: [name]    Attends meetings of clubs or organizations: [name]    Relationship status: [name]  . Intimate partner violence:    Fear of current or ex partner: [name]    Emotionally abused: [name]    Physically abused: [name]    Forced sexual activity: [name]  Other Topics Concern  . [name]  Social History Narrative  . [name]    Outpatient Medications Prior to Visit  Medication Sig Dispense Refill  . acetaminophen (TYLENOL) 325 MG tablet Take 650 mg by  mouth.    Marland Kitchen albuterol (PROVENTIL HFA;VENTOLIN HFA) 108 (90 Base) MCG/ACT inhaler Inhale 2 puffs into the lungs every 6 (six) hours as needed for wheezing or shortness of breath. 1 Inhaler 1  . amLODipine (NORVASC) 10 MG tablet Take 1 tablet (10 mg total) by mouth daily. (Patient not taking: Reported on 07/19/2018) 90 tablet 1  . aspirin EC 81 MG tablet Take 1 tablet (81 mg total) by mouth daily. 90 tablet 1  . carvedilol (COREG) 25 MG tablet Take 1 tablet (25 mg total) by mouth 2 (two) times daily with a meal. 180 tablet 1  . cephALEXin (KEFLEX) 500 MG capsule Take 1 capsule (500 mg total) by mouth 2 (two) times daily. 10 capsule 0  . conjugated estrogens (PREMARIN) vaginal cream Place vaginally.    . dicyclomine (BENTYL) 20 MG tablet Take 1 tablet (20 mg total) by mouth 3 (three) times daily as needed for spasms. 90 tablet 1  . famciclovir (FAMVIR) 250 MG tablet Take 1 tablet (250 mg total) by mouth daily. 30 tablet 12  . fluticasone (FLONASE) 50 MCG/ACT nasal spray Place 2 sprays into both nostrils daily. 16 g 6  . HYDROcodone-acetaminophen (NORCO/VICODIN) 5-325 MG tablet Take 1 tablet by mouth every 4 (four) hours as needed. 15 tablet 0  . Lactobacillus (ACIDOPHILUS) 100 MG CAPS Take 1 capsule (100 mg total) by mouth daily.  0  . lenalidomide (REVLIMID) 20 MG capsule Take 1 pill daily for 3 weeks on and 1 week off.  Take at bedtime 21 capsule 4  . levocetirizine (XYZAL) 5 MG tablet Take 1 tablet (5 mg total) by mouth every evening. (Patient not taking: Reported on 07/19/2018) 30 tablet 2  . lidocaine (LIDODERM) 5 % Place 1 patch onto the skin daily. Remove & Discard patch within 12 hours or as directed by MD (Patient not taking: Reported on 07/19/2018) 30 patch 2  . ondansetron (ZOFRAN) 4 MG tablet Take 1 tablet (4 mg total) by mouth every 8 (eight) hours as needed for nausea or vomiting. 30 tablet 1  . pantoprazole (PROTONIX) 40 MG tablet Take 1 tablet (40 mg total) by mouth 2 (two) times daily. 180  tablet 1  . potassium chloride SA (KLOR-CON M20) 20 MEQ tablet Take 1 tablet (20 mEq total) by mouth daily. 90 tablet 0  . sucralfate (CARAFATE) 1 g tablet Take 1 tablet (1 g total) by mouth 4 (four) times daily -  with  meals and at bedtime. 120 tablet 0  . topiramate (TOPAMAX) 25 MG tablet Take 1 tablet (25 mg total) by mouth 2 (two) times daily. 180 tablet 0   No facility-administered medications prior to visit.     Allergies  Allergen Reactions  . Benazepril Anaphylaxis and Swelling    angioedema Throat and lip swelling  . Codeine Nausea And Vomiting  . Morphine     Redness and hives noted post IV admin on 07/05/17  . Ondansetron Hcl     Redness and hives post IV admin on 07/05/17    Review of Systems  Constitutional: Negative for fever and malaise/fatigue.  HENT: Negative for congestion.   Eyes: Negative for blurred vision.  Respiratory: Negative for shortness of breath.   Cardiovascular: Negative for chest pain, palpitations and leg swelling.  Gastrointestinal: Positive for abdominal pain and diarrhea. Negative for blood in stool, melena and nausea.  Genitourinary: Negative for dysuria and frequency.  Musculoskeletal: Positive for back pain. Negative for falls.  Skin: Negative for rash.  Neurological: Negative for dizziness, loss of consciousness and headaches.  Endo/Heme/Allergies: Negative for environmental allergies.  Psychiatric/Behavioral: Negative for depression. The patient is not nervous/anxious.        Objective:    Physical Exam  Constitutional: She is oriented to person, place, and time. She appears well-developed and well-nourished. No distress.  HENT:  Head: Normocephalic and atraumatic.  Nose: Nose normal.  Eyes: Right eye exhibits no discharge. Left eye exhibits no discharge.  Neck: Normal range of motion. Neck supple.  Cardiovascular: Normal rate and regular rhythm.  No murmur heard. Pulmonary/Chest: Effort normal and breath sounds normal.    Abdominal: Soft. Bowel sounds are normal. There is no tenderness.  Musculoskeletal: She exhibits no edema.  Neurological: She is alert and oriented to person, place, and time.  Skin: Skin is warm and dry.  Psychiatric: She has a normal mood and affect.  Nursing note and vitals reviewed.   BP (!) 142/96   Pulse 92   Temp 98.2 F (36.8 C) (Oral)   Resp 18   Wt 175 lb 3.2 oz (79.5 kg)   SpO2 98%   BMI 30.07 kg/m  Wt Readings from Last 3 Encounters:  07/25/18 175 lb 3.2 oz (79.5 kg)  07/19/18 174 lb 4 oz (79 kg)  07/07/18 176 lb 3.2 oz (79.9 kg)   BP Readings from Last 3 Encounters:  07/26/18 (!) 160/110  07/27/18 (!) 142/96  07/19/18 121/77     Immunization History  Administered Date(s) Administered  . Influenza Whole 09/09/2009, 09/12/2013, 07/12/2015  . Influenza,inj,Quad PF,6+ Mos 07/14/2016, 07/27/2017, 07/07/2018  . Influenza-Unspecified 08/23/2014, 07/06/2015, 07/14/2016, 07/27/2017  . Pneumococcal Conjugate-13 07/12/2015  . Pneumococcal Polysaccharide-23 09/06/2014, 07/12/2015  . Pneumococcal-Unspecified 07/06/2015  . Tdap 12/01/2010, 10/11/2015  . Zoster 10/11/2015, 09/15/2016    Health Maintenance  Topic Date Due  . MAMMOGRAM  12/23/2017  . PAP SMEAR  10/10/2018  . COLONOSCOPY  10/26/2019  . TETANUS/TDAP  10/10/2025  . INFLUENZA VACCINE  Completed  . Hepatitis C Screening  Completed  . HIV Screening  Completed    Lab Results  Component Value Date   WBC 2.3 Repeated and verified X2. (L) 07/25/2018   HGB 10.2 (L) 07/25/2018   HCT 30.8 (L) 07/25/2018   PLT 110.0 (L) 07/25/2018   GLUCOSE 86 07/25/2018   CHOL 137 02/15/2018   TRIG 100.0 02/15/2018   HDL 45.90 02/15/2018   LDLCALC 72 02/15/2018   ALT 11 07/25/2018   AST  20 07/25/2018   NA 138 07/25/2018   K 3.5 07/25/2018   CL 106 07/25/2018   CREATININE 0.65 07/25/2018   BUN 18 07/25/2018   CO2 23 07/25/2018   TSH 1.79 02/15/2018   INR 0.99 11/17/2017    Lab Results  Component Value Date    TSH 1.79 02/15/2018   Lab Results  Component Value Date   WBC 2.3 Repeated and verified X2. (L) 07/25/2018   HGB 10.2 (L) 07/25/2018   HCT 30.8 (L) 07/25/2018   MCV 85.8 07/25/2018   PLT 110.0 (L) 07/25/2018   Lab Results  Component Value Date   NA 138 07/25/2018   K 3.5 07/25/2018   CHLORIDE 108 12/31/2016   CO2 23 07/25/2018   GLUCOSE 86 07/25/2018   BUN 18 07/25/2018   CREATININE 0.65 07/25/2018   BILITOT 0.3 07/25/2018   ALKPHOS 39 07/25/2018   AST 20 07/25/2018   ALT 11 07/25/2018   PROT 9.5 (H) 07/25/2018   ALBUMIN 3.4 (L) 07/25/2018   CALCIUM 8.9 07/25/2018   ANIONGAP 10 07/06/2018   EGFR >90 12/31/2016   GFR 117.96 07/25/2018   Lab Results  Component Value Date   CHOL 137 02/15/2018   Lab Results  Component Value Date   HDL 45.90 02/15/2018   Lab Results  Component Value Date   LDLCALC 72 02/15/2018   Lab Results  Component Value Date   TRIG 100.0 02/15/2018   Lab Results  Component Value Date   CHOLHDL 3 02/15/2018   No results found for: HGBA1C       Assessment & Plan:   Problem List Items Addressed This Visit    Multiple myeloma ([place]) - Primary (Chronic)    Is working closely with oncology closely and is tolerating Revlimid      Hypertension    Mild elevation on recheck, no changes today      AP (abdominal pain)   Relevant Orders   Urinalysis (Completed)   Urine Culture   C. difficile GDH and Toxin A/B   Low back pain    Persistent low back pain is becoming more debilitating. Proceed with CT lumbar spine      C. difficile colitis    Last diagnosed in May and with diarrhea increasing in frequency again. Will check Cdiff but also stool cultures also. Blood work unremarkable      Hematuria    Other Visit Diagnoses    Diarrhea, unspecified type       Relevant Orders   CBC w/Diff (Completed)   Comprehensive metabolic panel (Completed)   Stool, WBC/Lactoferrin   Stool Culture   Ova and parasite examination   C. difficile  GDH and Toxin A/B      I am having Gershon Mussel start on hyoscyamine. I am also having her maintain her conjugated estrogens, acetaminophen, carvedilol, amLODipine, fluticasone, pantoprazole, aspirin EC, albuterol, dicyclomine, ondansetron, lidocaine, topiramate, potassium chloride SA, levocetirizine, sucralfate, HYDROcodone-acetaminophen, cephALEXin, Acidophilus, lenalidomide, and famciclovir.  Meds ordered this encounter  Medications  . hyoscyamine (LEVSIN SL) 0.125 MG SL tablet    Sig: Place 1 tablet (0.125 mg total) under the tongue every 4 (four) hours as needed.    Dispense:  30 tablet    Refill:  2     Penni Homans, MD

## 2018-07-25 NOTE — Assessment & Plan Note (Addendum)
Last diagnosed in May and with diarrhea increasing in frequency again. Will check Cdiff but also stool cultures also. Blood work unremarkable

## 2018-07-26 ENCOUNTER — Encounter: Payer: Self-pay | Admitting: Physical Therapy

## 2018-07-26 ENCOUNTER — Ambulatory Visit: Payer: Medicare Other | Admitting: Physical Therapy

## 2018-07-26 ENCOUNTER — Other Ambulatory Visit: Payer: Medicare Other

## 2018-07-26 ENCOUNTER — Ambulatory Visit (HOSPITAL_BASED_OUTPATIENT_CLINIC_OR_DEPARTMENT_OTHER)
Admission: RE | Admit: 2018-07-26 | Discharge: 2018-07-26 | Disposition: A | Discharge status: Home / Self Care | Payer: Medicare Other | Source: Ambulatory Visit | Attending: Family Medicine | Admitting: Family Medicine

## 2018-07-26 VITALS — BP 160/110 | HR 99

## 2018-07-26 DIAGNOSIS — M25611 Stiffness of right shoulder, not elsewhere classified: Secondary | ICD-10-CM | POA: Diagnosis not present

## 2018-07-26 DIAGNOSIS — R2681 Unsteadiness on feet: Secondary | ICD-10-CM | POA: Diagnosis not present

## 2018-07-26 DIAGNOSIS — M546 Pain in thoracic spine: Secondary | ICD-10-CM | POA: Diagnosis not present

## 2018-07-26 DIAGNOSIS — M5136 Other intervertebral disc degeneration, lumbar region: Secondary | ICD-10-CM | POA: Insufficient documentation

## 2018-07-26 DIAGNOSIS — M47816 Spondylosis without myelopathy or radiculopathy, lumbar region: Secondary | ICD-10-CM | POA: Insufficient documentation

## 2018-07-26 DIAGNOSIS — M25511 Pain in right shoulder: Secondary | ICD-10-CM | POA: Diagnosis not present

## 2018-07-26 DIAGNOSIS — M4316 Spondylolisthesis, lumbar region: Secondary | ICD-10-CM | POA: Insufficient documentation

## 2018-07-26 DIAGNOSIS — M5441 Lumbago with sciatica, right side: Secondary | ICD-10-CM | POA: Insufficient documentation

## 2018-07-26 DIAGNOSIS — R109 Unspecified abdominal pain: Secondary | ICD-10-CM | POA: Diagnosis not present

## 2018-07-26 DIAGNOSIS — M5126 Other intervertebral disc displacement, lumbar region: Secondary | ICD-10-CM | POA: Diagnosis not present

## 2018-07-26 DIAGNOSIS — R197 Diarrhea, unspecified: Secondary | ICD-10-CM | POA: Diagnosis not present

## 2018-07-26 LAB — CBC WITH DIFFERENTIAL/PLATELET
BASOS ABS: 0 10*3/uL (ref 0.0–0.1)
BASOS PCT: 0.7 % (ref 0.0–3.0)
EOS ABS: 0.1 10*3/uL (ref 0.0–0.7)
Eosinophils Relative: 2.9 % (ref 0.0–5.0)
HEMATOCRIT: 30.8 % — AB (ref 36.0–46.0)
HEMOGLOBIN: 10.2 g/dL — AB (ref 12.0–15.0)
LYMPHS PCT: 34.3 % (ref 12.0–46.0)
Lymphs Abs: 0.8 10*3/uL (ref 0.7–4.0)
MCHC: 33.2 g/dL (ref 30.0–36.0)
MCV: 85.8 fl (ref 78.0–100.0)
MONOS PCT: 8.1 % (ref 3.0–12.0)
Monocytes Absolute: 0.2 10*3/uL (ref 0.1–1.0)
Neutro Abs: 1.3 10*3/uL — ABNORMAL LOW (ref 1.4–7.7)
Neutrophils Relative %: 54 % (ref 43.0–77.0)
Platelets: 110 10*3/uL — ABNORMAL LOW (ref 150.0–400.0)
RBC: 3.59 Mil/uL — ABNORMAL LOW (ref 3.87–5.11)
RDW: 16.3 % — ABNORMAL HIGH (ref 11.5–15.5)

## 2018-07-26 LAB — URINALYSIS
Bilirubin Urine: NEGATIVE
Ketones, ur: NEGATIVE
Leukocytes, UA: NEGATIVE
Nitrite: NEGATIVE
SPECIFIC GRAVITY, URINE: 1.025 (ref 1.000–1.030)
TOTAL PROTEIN, URINE-UPE24: NEGATIVE
URINE GLUCOSE: NEGATIVE
Urobilinogen, UA: 0.2 (ref 0.0–1.0)
pH: 5.5 (ref 5.0–8.0)

## 2018-07-26 LAB — COMPREHENSIVE METABOLIC PANEL
ALBUMIN: 3.4 g/dL — AB (ref 3.5–5.2)
ALT: 11 U/L (ref 0–35)
AST: 20 U/L (ref 0–37)
Alkaline Phosphatase: 39 U/L (ref 39–117)
BUN: 18 mg/dL (ref 6–23)
CO2: 23 mEq/L (ref 19–32)
CREATININE: 0.65 mg/dL (ref 0.40–1.20)
Calcium: 8.9 mg/dL (ref 8.4–10.5)
Chloride: 106 mEq/L (ref 96–112)
GFR: 117.96 mL/min (ref >60.00)
Glucose, Bld: 86 mg/dL (ref 70–99)
Potassium: 3.5 mEq/L (ref 3.5–5.1)
Sodium: 138 mEq/L (ref 135–145)
Total Bilirubin: 0.3 mg/dL (ref 0.2–1.2)
Total Protein: 9.5 g/dL — ABNORMAL HIGH (ref 6.0–8.3)

## 2018-07-26 NOTE — Therapy (Addendum)
[facility] [address]  [place], [place], [address] Phone: [phone]   Fax:  [phone]  Physical Therapy Treatment  Patient Details  Name: Tami [name] MRN: [id number] Date of Birth: [birth year] Referring Provider: Gwyneth Revels   Progress Note Reporting Period 07/19/18 to 07/26/18  See note below for Objective Data and Assessment of Progress/Goals.    Encounter Date: 07/26/2018  PT End of Session - 07/26/18 1650    Visit Number  2    Number of Visits  4    Date for PT Re-Evaluation  08/16/18    Authorization Type  [employer] Medicare & Medicaid    PT Start Time  1628    PT Stop Time  1648    PT Time Calculation (min)  20 min    Activity Tolerance  Patient tolerated treatment well;Patient limited by fatigue    Behavior During Therapy  WFL for tasks assessed/performed       Past Medical History:  Diagnosis Date  . Abnormal SPEP 07/26/2016  . Arthritis    knees, hands  . Back pain 07/14/2016  . Bowel obstruction ([place]) 11/2014  . C. difficile diarrhea   . CHF (congestive heart failure) ([place])   . Congestive heart failure ([place]) 02/24/2015   S/p pacemaker  . Depression   . Elevated sed rate 08/15/2017  . Epigastric pain 03/22/2017  . GERD (gastroesophageal reflux disease)   . Hyperlipidemia   . Hypertension   . 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 Low back pain 07/14/2016  . 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 Multiple myeloma ([place]) 07/20/2018  . Multiple myeloma not having achieved remission ([place]) 07/20/2018  . Myalgia 12/31/2016  . Obstructive sleep apnea 03/31/2015  . SOB (shortness of breath) 04/09/2016  . Stroke [facility])    TIAs    Past Surgical History:  Procedure Laterality Date  . ABDOMINAL HYSTERECTOMY     menorraghia, 2006, total  . CHOLECYSTECTOMY    . KNEE SURGERY     right, repair torn torn cartialage  . PACEMAKER INSERTION  08/2014  . TONSILLECTOMY    . TUBAL  LIGATION      Vitals:   07/26/18 1631 07/26/18 1644  BP: (!) 165/100 (!) 160/110  Pulse: 99 99  SpO2: 96% 95%    Subjective Assessment - 07/26/18 1633    Subjective  Patient arrived late. Says she has been running back and forth between MD appointments and feels weak today. Having some stomach pain today but told her MD about that yesterday. Denies SOB, lightheadedness, chest pain.     Pertinent History  back pain, CHF, pacemaker, epigastric pain, GERD, HLD, HTN, myalgia, stroke, R knee surgery    Diagnostic tests  02/22/18 xray thoracic: No acute bony abnormality.  Rightward scoliosis. 02/05/18 CT lumbar: Unchanged lumbar disc and facet degeneration, most notable at L4-5 where there is grade 1 anterolisthesis and mild-to-moderate neural foraminal stenosis.    Patient Stated Goals  "get it better if i can"    Currently in Pain?  Yes    Pain Score  5     Pain Location  Abdomen    Pain Descriptors / Indicators  Sharp    Pain Type  Acute pain                       [facility] Adult PT Treatment/Exercise - 07/26/18 0001      Exercises   Exercises  Ankle  Lumbar Exercises: Aerobic   Nustep  L1 x 6 min      Ankle Exercises: Stretches   Gastroc Stretch  2 reps;20 seconds;Limitations   against wall                 PT Long Term Goals - 07/26/18 1654      PT LONG TERM GOAL #1   Title  Patient to be independent with advanced HEP.    Time  4    Period  Weeks    Status  On-going      PT LONG TERM GOAL #2   Title  Patient able to stand on LLE for 10 seconds to improve balance    Time  4    Period  Weeks    Status  On-going      PT LONG TERM GOAL #3   Title  Patient able to perform step ups without UE support    Time  4    Period  Weeks    Status  On-going      PT LONG TERM GOAL #4   Status  On-going      PT LONG TERM GOAL #5   Status  On-going            Plan - 07/26/18 1651    Clinical Impression Statement  Patient arrived to session  late with report of abdominal pain and weakness. Patient denies lightheadedness, SOB, chest pain. Reports MD is aware of this pain. HR and BP both high at beginning of session. Attempted gentle LE warm up and gastroc and re-took vitals- diastolic BP increased to above therapeutic range. Patient still reporting abdominal pain however still denied SOB or chest pain. Patient reported that she has not taken her second dose of BP meds today. Advised patient that she is not safe for therapy at this time d/t abnormal vitals- ended session early. Advised patient to take BP meds as directed and educated on red flag symptoms to watch out for and to contact MD or ED as appropriate. Patient reported understanding.     Clinical Impairments Affecting Rehab Potential  pacemaker, CHF, GERD, HLD, HTN, stroke, R knee surgery    PT Treatment/Interventions  Therapeutic exercise;Balance training;Manual techniques    Consulted and Agree with Plan of Care  Patient       Patient will benefit from skilled therapeutic intervention in order to improve the following deficits and impairments:  Impaired flexibility, Decreased balance, Decreased strength  Visit Diagnosis: Unsteadiness on feet  Acute pain of right shoulder  Stiffness of right shoulder, not elsewhere classified  Pain in thoracic spine     Problem List Patient Active Problem List   Diagnosis Date Noted  . Multiple myeloma ([place]) 07/20/2018  . Multiple myeloma not having achieved remission ([place]) 07/20/2018  . Hematuria 07/11/2018  . Seasonal allergies 06/23/2018  . Right shoulder pain 06/07/2018  . Neck pain 06/07/2018  . Paresthesias 06/07/2018  . 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 Pain in thoracic spine 03/18/2018  . Thoracic back pain 02/15/2018  . Bronchitis 02/15/2018  . 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 Pneumonia 11/11/2017  . Cystitis 10/25/2017  . C. difficile colitis 09/15/2017  . Hypokalemia 08/20/2017  . Anemia 08/20/2017   . Elevated sed rate 08/15/2017  . 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 Epigastric pain 03/22/2017  . Myalgia 12/31/2016  . 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 SOB (shortness of breath) 07/26/2016  .  Abnormal SPEP 07/26/2016  . Low back pain 07/14/2016  . Insomnia 12/29/2015  . Tension headache 10/21/2015  . Muscle spasms of neck 10/18/2015  . Dysuria 10/13/2015  . Sinusitis 07/07/2015  . AP (abdominal pain) 07/07/2015  . Cardiomyopathy ([place]) 04/19/2015  . Chest wall pain 04/19/2015  . Obstructive sleep apnea 03/31/2015  . Congestive heart failure ([place]) 02/24/2015  . Hyperlipidemia   . Hypertension   . Depression   . Stroke ([place])   . GERD (gastroesophageal reflux disease)   . Bowel obstruction ([place]) 11/09/2014    Janene Harvey, PT, DPT 07/26/18 4:58 PM    [facility] [address]  [place], [place], [address] Phone: [phone]   Fax:  [phone]  Name: Tami [name] MRN: [id number] Date of Birth: 1956/01/27  PHYSICAL THERAPY DISCHARGE SUMMARY  Visits from Start of Care: 2  Current functional level related to goals / functional outcomes: Unable to assess; patient requesting to be D/C'd d/t change in medical status    Remaining deficits: Unable to assess   Education / Equipment: HEP  Plan: Patient agrees to discharge.  Patient goals were not met. Patient is being discharged due to a change in medical status.  ?????     Janene Harvey, PT, DPT 08/18/18 1:01 PM

## 2018-07-27 ENCOUNTER — Other Ambulatory Visit: Payer: Self-pay | Admitting: Physician Assistant

## 2018-07-27 ENCOUNTER — Other Ambulatory Visit: Payer: Self-pay | Admitting: Radiology

## 2018-07-27 LAB — URINE CULTURE
MICRO NUMBER: 91113916
RESULT: NO GROWTH
SPECIMEN QUALITY:: ADEQUATE

## 2018-07-27 NOTE — Assessment & Plan Note (Signed)
Persistent low back pain is becoming more debilitating. Proceed with CT lumbar spine

## 2018-07-27 NOTE — Assessment & Plan Note (Signed)
Is working closely with oncology closely and is tolerating Revlimid

## 2018-07-27 NOTE — Assessment & Plan Note (Signed)
Mild elevation on recheck, no changes today

## 2018-07-28 ENCOUNTER — Ambulatory Visit (HOSPITAL_COMMUNITY)
Admission: RE | Admit: 2018-07-28 | Discharge: 2018-07-28 | Disposition: A | Discharge status: Home / Self Care | Payer: Medicare Other | Source: Ambulatory Visit | Attending: Hematology & Oncology | Admitting: Hematology & Oncology

## 2018-07-28 ENCOUNTER — Encounter (HOSPITAL_COMMUNITY): Payer: Self-pay

## 2018-07-28 ENCOUNTER — Ambulatory Visit (HOSPITAL_COMMUNITY)
Admission: RE | Admit: 2018-07-28 | Discharge: 2018-07-28 | Disposition: A | Discharge status: Home / Self Care | Payer: Medicare Other | Source: Ambulatory Visit | Attending: Family Medicine | Admitting: Family Medicine

## 2018-07-28 ENCOUNTER — Other Ambulatory Visit: Payer: Self-pay

## 2018-07-28 DIAGNOSIS — I509 Heart failure, unspecified: Secondary | ICD-10-CM | POA: Diagnosis not present

## 2018-07-28 DIAGNOSIS — Z7951 Long term (current) use of inhaled steroids: Secondary | ICD-10-CM | POA: Insufficient documentation

## 2018-07-28 DIAGNOSIS — Z8261 Family history of arthritis: Secondary | ICD-10-CM | POA: Diagnosis not present

## 2018-07-28 DIAGNOSIS — C9000 Multiple myeloma not having achieved remission: Secondary | ICD-10-CM | POA: Diagnosis not present

## 2018-07-28 DIAGNOSIS — Z8249 Family history of ischemic heart disease and other diseases of the circulatory system: Secondary | ICD-10-CM | POA: Diagnosis not present

## 2018-07-28 DIAGNOSIS — Z7982 Long term (current) use of aspirin: Secondary | ICD-10-CM | POA: Diagnosis not present

## 2018-07-28 DIAGNOSIS — Z888 Allergy status to other drugs, medicaments and biological substances status: Secondary | ICD-10-CM | POA: Diagnosis not present

## 2018-07-28 DIAGNOSIS — Z8673 Personal history of transient ischemic attack (TIA), and cerebral infarction without residual deficits: Secondary | ICD-10-CM | POA: Diagnosis not present

## 2018-07-28 DIAGNOSIS — F329 Major depressive disorder, single episode, unspecified: Secondary | ICD-10-CM | POA: Insufficient documentation

## 2018-07-28 DIAGNOSIS — Z800 Family history of malignant neoplasm of digestive organs: Secondary | ICD-10-CM | POA: Insufficient documentation

## 2018-07-28 DIAGNOSIS — G4733 Obstructive sleep apnea (adult) (pediatric): Secondary | ICD-10-CM | POA: Insufficient documentation

## 2018-07-28 DIAGNOSIS — D571 Sickle-cell disease without crisis: Secondary | ICD-10-CM | POA: Insufficient documentation

## 2018-07-28 DIAGNOSIS — Z79899 Other long term (current) drug therapy: Secondary | ICD-10-CM | POA: Insufficient documentation

## 2018-07-28 DIAGNOSIS — Z833 Family history of diabetes mellitus: Secondary | ICD-10-CM | POA: Diagnosis not present

## 2018-07-28 DIAGNOSIS — Z885 Allergy status to narcotic agent status: Secondary | ICD-10-CM | POA: Insufficient documentation

## 2018-07-28 DIAGNOSIS — Z8379 Family history of other diseases of the digestive system: Secondary | ICD-10-CM | POA: Diagnosis not present

## 2018-07-28 DIAGNOSIS — E785 Hyperlipidemia, unspecified: Secondary | ICD-10-CM | POA: Diagnosis not present

## 2018-07-28 DIAGNOSIS — D801 Nonfamilial hypogammaglobulinemia: Secondary | ICD-10-CM | POA: Insufficient documentation

## 2018-07-28 DIAGNOSIS — N289 Disorder of kidney and ureter, unspecified: Secondary | ICD-10-CM | POA: Diagnosis not present

## 2018-07-28 DIAGNOSIS — I110 Hypertensive heart disease with heart failure: Secondary | ICD-10-CM | POA: Diagnosis not present

## 2018-07-28 DIAGNOSIS — D61818 Other pancytopenia: Secondary | ICD-10-CM | POA: Diagnosis not present

## 2018-07-28 DIAGNOSIS — K219 Gastro-esophageal reflux disease without esophagitis: Secondary | ICD-10-CM | POA: Insufficient documentation

## 2018-07-28 DIAGNOSIS — Z90710 Acquired absence of both cervix and uterus: Secondary | ICD-10-CM | POA: Insufficient documentation

## 2018-07-28 DIAGNOSIS — Z9049 Acquired absence of other specified parts of digestive tract: Secondary | ICD-10-CM | POA: Diagnosis not present

## 2018-07-28 DIAGNOSIS — Z950 Presence of cardiac pacemaker: Secondary | ICD-10-CM | POA: Insufficient documentation

## 2018-07-28 DIAGNOSIS — D4989 Neoplasm of unspecified behavior of other specified sites: Secondary | ICD-10-CM | POA: Diagnosis not present

## 2018-07-28 LAB — CBC WITH DIFFERENTIAL/PLATELET
Basophils Absolute: 0 10*3/uL (ref 0.0–0.1)
Basophils Relative: 0 %
EOS PCT: 4 %
Eosinophils Absolute: 0.1 10*3/uL (ref 0.0–0.7)
HEMATOCRIT: 32.4 % — AB (ref 36.0–46.0)
Hemoglobin: 10.1 g/dL — ABNORMAL LOW (ref 12.0–15.0)
LYMPHS ABS: 0.6 10*3/uL — AB (ref 0.7–4.0)
Lymphocytes Relative: 22 %
MCH: 27.4 pg (ref 26.0–34.0)
MCHC: 31.2 g/dL (ref 30.0–36.0)
MCV: 87.8 fL (ref 78.0–100.0)
MONO ABS: 0.2 10*3/uL (ref 0.1–1.0)
Monocytes Relative: 6 %
Neutro Abs: 1.8 10*3/uL (ref 1.7–7.7)
Neutrophils Relative %: 68 %
Platelets: 132 10*3/uL — ABNORMAL LOW (ref 150–400)
RBC: 3.69 MIL/uL — ABNORMAL LOW (ref 3.87–5.11)
RDW: 15.8 % — AB (ref 11.5–15.5)
WBC: 2.6 10*3/uL — ABNORMAL LOW (ref 4.0–10.5)

## 2018-07-28 LAB — PROTIME-INR
INR: 0.9
Prothrombin Time: 12.1 seconds (ref 11.4–15.2)

## 2018-07-28 MED ORDER — LIDOCAINE HCL (PF) 1 % IJ SOLN
INTRAMUSCULAR | Status: AC | PRN
  Administered 2018-07-28: 10 mL

## 2018-07-28 MED ORDER — FENTANYL CITRATE (PF) 100 MCG/2ML IJ SOLN
INTRAMUSCULAR | Status: AC
  Filled 2018-07-28: qty 2

## 2018-07-28 MED ORDER — SODIUM CHLORIDE 0.9 % IV SOLN
INTRAVENOUS | Status: DC
  Administered 2018-07-28: 07:00:00 via INTRAVENOUS

## 2018-07-28 MED ORDER — MIDAZOLAM HCL 2 MG/2ML IJ SOLN
INTRAMUSCULAR | Status: AC
  Filled 2018-07-28: qty 4

## 2018-07-28 MED ORDER — MIDAZOLAM HCL 2 MG/2ML IJ SOLN
INTRAMUSCULAR | Status: AC | PRN
  Administered 2018-07-28 (×3): 1 mg via INTRAVENOUS

## 2018-07-28 MED ORDER — FENTANYL CITRATE (PF) 100 MCG/2ML IJ SOLN
INTRAMUSCULAR | Status: AC | PRN
  Administered 2018-07-28 (×2): 50 ug via INTRAVENOUS

## 2018-07-28 NOTE — H&P (Signed)
Chief Complaint: Patient was seen in consultation today for multiple myeloma.  Referring Physician(s): Ennever,Peter R  Supervising Physician: Corrie Mckusick  Patient Status: [facility] - Out-pt  History of Present Illness: Tami [name] is a 62 y.o. female with a past medical history of hypertension, hyperlipidemia, HF, collagen vascular disease, TIA, GERD, bowel obstruction, C. Diff infection, renal insufficiency, sickle cell anemia, multiple myeloma, OSA, back pain, arthritis, and depression. She was diagnosed with multiple myeloma in 11/2017. Her cancer has been managed by Dr. Marin Olp.  IR requested by Dr. Marin Olp for possible image-guided bone marrow biopsy/aspiration. Patient awake and alert laying in bed. Accompanied by friend at bedside. Complains of intermittent abdominal pain for the past few weeks. States that "I think it is because of my stool" and her PCP is "testing my stool" to assess abdominal pain. Denies fever, chills, chest pain, dyspnea, headache, or dizziness.   Past Medical History:  Diagnosis Date  . Abnormal SPEP 07/26/2016  . Arthritis    knees, hands  . Back pain 07/14/2016  . Bowel obstruction ([place]) 11/2014  . C. difficile diarrhea   . CHF (congestive heart failure) ([place])   . Congestive heart failure ([place]) 02/24/2015   S/p pacemaker  . Depression   . Elevated sed rate 08/15/2017  . Epigastric pain 03/22/2017  . GERD (gastroesophageal reflux disease)   . Hyperlipidemia   . Hypertension   . 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 Low back pain 07/14/2016  . 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 Multiple myeloma ([place]) 07/20/2018  . Multiple myeloma not having achieved remission ([place]) 07/20/2018  . Myalgia 12/31/2016  . Obstructive sleep apnea 03/31/2015  . SOB (shortness of breath) 04/09/2016  . Stroke [facility])    TIAs    Past Surgical History:  Procedure Laterality Date  . ABDOMINAL HYSTERECTOMY     menorraghia, 2006, total  .  CHOLECYSTECTOMY    . KNEE SURGERY     right, repair torn torn cartialage  . PACEMAKER INSERTION  08/2014  . TONSILLECTOMY    . TUBAL LIGATION      Allergies: Benazepril; Codeine; Morphine; and Ondansetron hcl  Medications: Prior to Admission medications   Medication Sig Start Date End Date Taking? Authorizing Provider  acetaminophen (TYLENOL) 325 MG tablet Take 650 mg by mouth. 06/07/17  Yes [provider]  amLODipine (NORVASC) 10 MG tablet Take 1 tablet (10 mg total) by mouth daily. 02/15/18 02/15/19 Yes Mosie Lukes, MD  aspirin EC 81 MG tablet Take 1 tablet (81 mg total) by mouth daily. 02/15/18  Yes Mosie Lukes, MD  carvedilol (COREG) 25 MG tablet Take 1 tablet (25 mg total) by mouth 2 (two) times daily with a meal. 02/15/18  Yes Mosie Lukes, MD  famciclovir (FAMVIR) 250 MG tablet Take 1 tablet (250 mg total) by mouth daily. 07/20/18  Yes [name], MD  Lactobacillus (ACIDOPHILUS) 100 MG CAPS Take 1 capsule (100 mg total) by mouth daily. 07/07/18  Yes Mosie Lukes, MD  lenalidomide (REVLIMID) 20 MG capsule Take 1 pill daily for 3 weeks on and 1 week off.  Take at bedtime 07/19/18  Yes Ennever, Rudell Cobb, MD  pantoprazole (PROTONIX) 40 MG tablet Take 1 tablet (40 mg total) by mouth 2 (two) times daily. 02/15/18  Yes Mosie Lukes, MD  potassium chloride SA (KLOR-CON M20) 20 MEQ tablet Take 1 tablet (20 mEq total) by mouth daily. 06/07/18  Yes Mosie Lukes, MD  sucralfate (Gilgo) 1  g tablet Take 1 tablet (1 g total) by mouth 4 (four) times daily -  with meals and at bedtime. 06/29/18  Yes Saguier, Percell Miller, PA-C  topiramate (TOPAMAX) 25 MG tablet Take 1 tablet (25 mg total) by mouth 2 (two) times daily. 06/07/18  Yes Mosie Lukes, MD  albuterol (PROVENTIL HFA;VENTOLIN HFA) 108 (90 Base) MCG/ACT inhaler Inhale 2 puffs into the lungs every 6 (six) hours as needed for wheezing or shortness of breath. 02/15/18   Mosie Lukes, MD  cephALEXin (KEFLEX) 500 MG capsule Take 1  capsule (500 mg total) by mouth 2 (two) times daily. 07/06/18   Recardo Evangelist, PA-C  conjugated estrogens (PREMARIN) vaginal cream Place vaginally. 01/31/14   [provider]  dicyclomine (BENTYL) 20 MG tablet Take 1 tablet (20 mg total) by mouth 3 (three) times daily as needed for spasms. 04/14/18   Mosie Lukes, MD  fluticasone (FLONASE) 50 MCG/ACT nasal spray Place 2 sprays into both nostrils daily. 02/15/18   Mosie Lukes, MD  HYDROcodone-acetaminophen (NORCO/VICODIN) 5-325 MG tablet Take 1 tablet by mouth every 4 (four) hours as needed. 07/06/18   Recardo Evangelist, PA-C  hyoscyamine (LEVSIN SL) 0.125 MG SL tablet Place 1 tablet (0.125 mg total) under the tongue every 4 (four) hours as needed. 07/25/18   Mosie Lukes, MD  levocetirizine (XYZAL) 5 MG tablet Take 1 tablet (5 mg total) by mouth every evening. 06/23/18   Shelda Pal, DO  lidocaine (LIDODERM) 5 % Place 1 patch onto the skin daily. Remove & Discard patch within 12 hours or as directed by MD Patient not taking: Reported on 07/19/2018 06/07/18   Jodelle Green, FNP  ondansetron (ZOFRAN) 4 MG tablet Take 1 tablet (4 mg total) by mouth every 8 (eight) hours as needed for nausea or vomiting. 04/14/18   Mosie Lukes, MD     Family History  Problem Relation Age of Onset  . Diabetes Mother   . Hypertension Mother   . Heart disease Mother        s/p 1 stent  . Hyperlipidemia Mother   . Arthritis Mother   . Cancer Father        COLON  . Colon cancer Father 50  . Irritable bowel syndrome Sister   . Hyperlipidemia Daughter   . Hypertension Daughter   . Hypertension Maternal Grandmother   . Arthritis Maternal Grandmother   . Heart disease Maternal Grandfather        MI  . Hypertension Maternal Grandfather   . Arthritis Maternal Grandfather   . Hypertension Son     Social History   Socioeconomic History  . Marital status: Single    Spouse name: [name]  . Number of children: [name]  .  Years of education: [name]  . Highest education level: [name]  Occupational History  . [name]  Social Needs  . Financial resource strain: [name]  . Food insecurity:    Worry: [name]    Inability: [name]  . Transportation needs:    Medical: [name]    Non-medical: [name]  Tobacco Use  . Smoking status: Never Smoker  . Smokeless tobacco: Never Used  Substance and Sexual Activity  . Alcohol use: No    Alcohol/week: 0.0 standard drinks  . Drug use: No  . Sexual activity: [name]  Lifestyle  . Physical activity:  Days per week: [name]    Minutes per session: [name]  . Stress: [name]  Relationships  . Social connections:    Talks on phone: [name]    Gets together: [name]    Attends religious service: [name]    Active member of club or organization: [name]    Attends meetings of clubs or organizations: [name]    Relationship status: [name]  Other Topics Concern  . [name]  Social History Narrative  . [name]     Review of Systems: A 12 point ROS discussed and pertinent positives are indicated in the HPI above.  All other systems are negative.  Review of Systems  Constitutional: Negative for chills and fever.  Respiratory: Negative for shortness of breath and wheezing.   Cardiovascular: Negative for chest pain and palpitations.  Gastrointestinal: Positive for abdominal pain.  Neurological: Negative for dizziness and headaches.  Psychiatric/Behavioral: Negative for behavioral problems and confusion.    Vital Signs: BP (!) 153/92   Pulse 75   Temp 98 F (36.7 C) (Oral)   Resp 16   SpO2 100%   Physical Exam  Constitutional: She is oriented to person, place, and time. She appears well-developed and well-nourished. No distress.  Cardiovascular: Normal rate, regular rhythm and normal heart sounds.  No murmur heard. Pulmonary/Chest: Effort normal and breath sounds normal.  No respiratory distress. She has no wheezes.  Abdominal: Soft. There is no tenderness.  Neurological: She is alert and oriented to person, place, and time.  Skin: Skin is warm and dry.  Psychiatric: She has a normal mood and affect. Her behavior is normal. Judgment and thought content normal.  Nursing note and vitals reviewed.    MD Evaluation Airway: WNL Heart: WNL Abdomen: WNL Chest/ Lungs: WNL ASA  Classification: 3 Mallampati/Airway Score: Two   Imaging: Dg Lumbar Spine Complete  Result Date: 07/06/2018 CLINICAL DATA:  Fall on Sunday.  Low back pain. EXAM: LUMBAR SPINE - COMPLETE 4+ VIEW COMPARISON:  CT abdomen 06/24/2018 FINDINGS: AICD lead noted projecting over the cardiac shadow. Cholecystectomy clips noted. Stable 6 mm of degenerative anterolisthesis at L4-5. No appreciable lumbar spine fracture. Bony demineralization. Lower lumbar degenerative facet arthropathy. Generally preserved intervertebral disc height. IMPRESSION: 1. No acute lumbar spine findings. 2. Stable 6 mm degenerative anterolisthesis at L4-5. 3. Lower lumbar spondylosis. Electronically Signed   By: Van Clines M.D.   On: 07/06/2018 12:22   Ct Lumbar Spine Wo Contrast  Result Date: 07/27/2018 CLINICAL DATA:  Right low back pain for 6 weeks EXAM: CT LUMBAR SPINE WITHOUT CONTRAST TECHNIQUE: Multidetector CT imaging of the lumbar spine was performed without intravenous contrast administration. Multiplanar CT image reconstructions were also generated. COMPARISON:  Multiple exams, including 02/05/2018 CT, and 07/06/2018 radiographs FINDINGS: Segmentation: The lowest lumbar type non-rib-bearing vertebra is labeled as L5. Alignment: 7 mm of degenerative anterolisthesis at L4-5, not changed from 02/05/2018. No pars defects. Vertebrae: Degenerative facet arthropathy especially at L4-5 where there is nitrogen gas phenomenon and extensive spurring. Lesser degree of facet arthropathy at L3-4. Vacuum disc phenomenon at  L5-S1. Preserved intervertebral disc height. No lumbar fracture is identified. Paraspinal and other soft tissues: Fluid density along the right renal hilum along the upper pole, probably a parapelvic cyst. Disc levels: T12-L1: Unremarkable. L1-2: Borderline right foraminal stenosis due to mild facet arthropathy. L2-3: No impingement.  Mild disc bulge. L3-4: No impingement.  Mild disc bulge. L4-5: Mild bilateral foraminal stenosis due to disc uncovering and mild disc bulge. Bilateral facet arthropathy. L5-S1: No impingement.  Minimal disc bulge. IMPRESSION: 1. Lumbar spondylosis and degenerative disc disease, causing mild and stable bilateral foraminal impingement at the L4-5 level as detailed above. 2. 7 mm of degenerative anterolisthesis at L4-5. Electronically Signed   By: Van Clines M.D.   On: 07/27/2018 09:17    Labs:  CBC: Recent Labs    07/06/18 1209 07/07/18 1704 07/25/18 1504 07/28/18 0723  WBC 2.3* 2.6* 2.3 Repeated and verified X2.* 2.6*  HGB 11.7* 10.5* 10.2* 10.1*  HCT 35.8* 31.8* 30.8* 32.4*  PLT 120* 119.0* 110.0* 132*    COAGS: Recent Labs    11/17/17 0714 07/28/18 0723  INR 0.99 0.90    BMP: Recent Labs    12/07/17 1345  01/25/18 0858  07/01/18 0857 07/06/18 1209 07/07/18 1704 07/25/18 1504  NA 138   < > 139   < > 138 141 138 138  K 3.7   < > 4.1   < > 3.9 3.0* 3.8 3.5  CL 109   < > 109   < > 107 105 104 106  CO2 23   < > 24   < > _0 GLUCOSE 101   < > 88   < > 94 90 99 86  BUN 20   < > 18   < > _1 CALCIUM 9.2   < > 9.2   < > 9.5 8.8* 9.1 8.9  CREATININE 0.84   < > 0.83   < > 0.83 0.65 0.83 0.65  GFRNONAA >60  --  >60  --  >60 >60  --   --   GFRAA >60  --  >60  --  >60 >60  --   --    < > = values in this interval not displayed.    LIVER FUNCTION TESTS: Recent Labs    06/29/18 1604 07/01/18 0857 07/07/18 1704 07/25/18 1504  BILITOT 0.4 0.4 0.3 0.3  AST _2 ALT _3 ALKPHOS 40 46 39 39  PROT 9.7*  10.5* 9.7* 9.5*  ALBUMIN 3.4* 3.1* 3.4* 3.4*    TUMOR MARKERS: No results for input(s): AFPTM, CEA, CA199, CHROMGRNA in the last 8760 hours.  Assessment and Plan:  Multiple myeloma. Plan for image-guided bone marrow aspiration/biopsy today with Dr. Earleen Newport. Patient is NPO. Denies fever. She does not take blood thinners. INR 0.9 seconds this AM.  Risks and benefits discussed with the patient including, but not limited to bleeding, infection, damage to adjacent structures or low yield requiring additional tests. All of the patient's questions were answered, patient is agreeable to proceed. Consent signed and in chart.   Thank you for this interesting consult.  I greatly enjoyed meeting Tami [name] and look forward to participating in their care.  A copy of this report was sent to the requesting provider on this date.  Electronically Signed: Earley Abide, PA-C 07/28/2018, 8:20 AM   I spent a total of 25 Minutes in face to face in clinical consultation, greater than 50% of which was counseling/coordinating care for multiple myeloma.

## 2018-07-28 NOTE — Procedures (Signed)
Interventional Radiology Procedure Note ? ?Procedure: CT guided aspirate and core biopsy of right iliac bone ?Complications: None ?Recommendations: ?- Bedrest supine x 1 hrs ?- OTC's PRN  Pain ?- Follow biopsy results ? ?Signed, ? ?Diante Barley S. Vicy Medico, DO ? ? ?

## 2018-07-28 NOTE — Discharge Instructions (Signed)
Bone Marrow Aspiration and Bone Marrow Biopsy, Adult, Care After °This sheet gives you information about how to care for yourself after your procedure. Your health care provider may also give you more specific instructions. If you have problems or questions, contact your health care provider. °What can I expect after the procedure? °After the procedure, it is common to have: °· Mild pain and tenderness. °· Swelling. °· Bruising. ° °Follow these instructions at home: °· Take over-the-counter or prescription medicines only as told by your health care provider. °· Do not take baths, swim, or use a hot tub until your health care provider approves. Ask if you can take a shower or have a sponge bath. °· Follow instructions from your health care provider about how to take care of the puncture site. Make sure you: °? Wash your hands with soap and water before you change your bandage (dressing). If soap and water are not available, use hand sanitizer. °? Change your dressing as told by your health care provider. °· Check your puncture site every day for signs of infection. Check for: °? More redness, swelling, or pain. °? More fluid or blood. °? Warmth. °? Pus or a bad smell. °· Return to your normal activities as told by your health care provider. Ask your health care provider what activities are safe for you. °· Do not drive for 24 hours if you were given a medicine to help you relax (sedative). °· Keep all follow-up visits as told by your health care provider. This is important. °Contact a health care provider if: °· You have more redness, swelling, or pain around the puncture site. °· You have more fluid or blood coming from the puncture site. °· Your puncture site feels warm to the touch. °· You have pus or a bad smell coming from the puncture site. °· You have a fever. °· Your pain is not controlled with medicine. °This information is not intended to replace advice given to you by your health care provider. Make sure  you discuss any questions you have with your health care provider. °Document Released: 05/15/2005 Document Revised: 05/15/2016 Document Reviewed: 04/08/2016 °Elsevier Interactive Patient Education © 2018 Elsevier Inc. ° °Moderate Conscious Sedation, Adult, Care After °These instructions provide you with information about caring for yourself after your procedure. Your health care provider may also give you more specific instructions. Your treatment has been planned according to current medical practices, but problems sometimes occur. Call your health care provider if you have any problems or questions after your procedure. °What can I expect after the procedure? °After your procedure, it is common: °· To feel sleepy for several hours. °· To feel clumsy and have poor balance for several hours. °· To have poor judgment for several hours. °· To vomit if you eat too soon. ° °Follow these instructions at home: °For at least 24 hours after the procedure: ° °· Do not: °? Participate in activities where you could fall or become injured. °? Drive. °? Use heavy machinery. °? Drink alcohol. °? Take sleeping pills or medicines that cause drowsiness. °? Make important decisions or sign legal documents. °? Take care of children on your own. °· Rest. °Eating and drinking °· Follow the diet recommended by your health care provider. °· If you vomit: °? Drink water, juice, or soup when you can drink without vomiting. °? Make sure you have little or no nausea before eating solid foods. °General instructions °· Have a responsible adult stay with you until you   with you until you are awake and alert.  Take over-the-counter and prescription medicines only as told by your health care provider.  If you smoke, do not smoke without supervision.  Keep all follow-up visits as told by your health care provider. This is important. Contact a health care provider if:  You keep feeling nauseous or you keep vomiting.  You feel light-headed.  You  develop a rash.  You have a fever. Get help right away if:  You have trouble breathing. This information is not intended to replace advice given to you by your health care provider. Make sure you discuss any questions you have with your health care provider. Document Released: 08/16/2013 Document Revised: 03/30/2016 Document Reviewed: 02/15/2016 Elsevier Interactive Patient Education  [employer].

## 2018-07-29 DIAGNOSIS — M542 Cervicalgia: Secondary | ICD-10-CM | POA: Diagnosis not present

## 2018-07-30 LAB — OVA AND PARASITE EXAMINATION
CONCENTRATE RESULT: NONE SEEN
SPECIMEN QUALITY:: ADEQUATE
TRICHROME RESULT:: NONE SEEN
VKL: 91114264

## 2018-07-30 LAB — STOOL CULTURE
MICRO NUMBER: 91114262
MICRO NUMBER: 91114265
MICRO NUMBER:: 91114260
SHIGA RESULT:: NOT DETECTED
SPECIMEN QUALITY: ADEQUATE
SPECIMEN QUALITY:: ADEQUATE
SPECIMEN QUALITY:: ADEQUATE

## 2018-07-30 LAB — FECAL LACTOFERRIN, QUANT
Fecal Lactoferrin: NEGATIVE
MICRO NUMBER:: 91114263
SPECIMEN QUALITY:: ADEQUATE

## 2018-07-30 LAB — C. DIFFICILE GDH AND TOXIN A/B
GDH ANTIGEN: DETECTED
MICRO NUMBER: 91114261
SPECIMEN QUALITY:: ADEQUATE
TOXIN A AND B: NOT DETECTED

## 2018-07-30 LAB — CLOSTRIDIUM DIFFICILE TOXIN B, QUALITATIVE, REAL-TIME PCR: CDIFFPCR: NOT DETECTED

## 2018-08-01 ENCOUNTER — Ambulatory Visit (HOSPITAL_COMMUNITY)
Admission: RE | Admit: 2018-08-01 | Discharge: 2018-08-01 | Disposition: A | Discharge status: Home / Self Care | Payer: Medicare Other | Source: Ambulatory Visit | Attending: Hematology & Oncology | Admitting: Hematology & Oncology

## 2018-08-01 DIAGNOSIS — E041 Nontoxic single thyroid nodule: Secondary | ICD-10-CM | POA: Insufficient documentation

## 2018-08-01 DIAGNOSIS — C9000 Multiple myeloma not having achieved remission: Secondary | ICD-10-CM | POA: Diagnosis not present

## 2018-08-01 DIAGNOSIS — M542 Cervicalgia: Secondary | ICD-10-CM | POA: Diagnosis not present

## 2018-08-01 LAB — GLUCOSE, CAPILLARY: GLUCOSE-CAPILLARY: 97 mg/dL (ref 70–99)

## 2018-08-01 MED ORDER — FLUDEOXYGLUCOSE F - 18 (FDG) INJECTION
8.7000 | Freq: Once | INTRAVENOUS | Status: AC
  Administered 2018-08-01: 8.7 via INTRAVENOUS

## 2018-08-02 ENCOUNTER — Ambulatory Visit: Payer: Medicare Other | Admitting: Physical Therapy

## 2018-08-02 ENCOUNTER — Telehealth: Payer: Self-pay | Admitting: *Deleted

## 2018-08-02 NOTE — Telephone Encounter (Addendum)
Patient is aware of results  ----- Message from [name], MD sent at 08/01/2018  4:49 PM EDT ----- Call - the bone marrow does show myeloma, but it is not too bad!!!  Lattie Haw, Rudell Cobb, MD  P Onc Nurse Hp        Call - the PET scan really shows minimal if any myeloma lesions in your bones!! Laurey Arrow

## 2018-08-04 DIAGNOSIS — Z950 Presence of cardiac pacemaker: Secondary | ICD-10-CM | POA: Diagnosis not present

## 2018-08-04 DIAGNOSIS — M542 Cervicalgia: Secondary | ICD-10-CM | POA: Diagnosis not present

## 2018-08-05 ENCOUNTER — Inpatient Hospital Stay (HOSPITAL_BASED_OUTPATIENT_CLINIC_OR_DEPARTMENT_OTHER): Payer: Medicare Other | Admitting: Hematology & Oncology

## 2018-08-05 ENCOUNTER — Other Ambulatory Visit: Payer: Self-pay

## 2018-08-05 ENCOUNTER — Inpatient Hospital Stay: Payer: Medicare Other

## 2018-08-05 ENCOUNTER — Encounter: Payer: Self-pay | Admitting: Hematology & Oncology

## 2018-08-05 ENCOUNTER — Ambulatory Visit (INDEPENDENT_AMBULATORY_CARE_PROVIDER_SITE_OTHER): Payer: Medicare Other | Admitting: Family Medicine

## 2018-08-05 ENCOUNTER — Other Ambulatory Visit: Payer: Self-pay | Admitting: *Deleted

## 2018-08-05 ENCOUNTER — Encounter: Payer: Self-pay | Admitting: Family Medicine

## 2018-08-05 VITALS — BP 147/81 | HR 84 | Temp 98.1°F | Resp 16 | Wt 177.0 lb

## 2018-08-05 DIAGNOSIS — I10 Essential (primary) hypertension: Secondary | ICD-10-CM

## 2018-08-05 DIAGNOSIS — Z79899 Other long term (current) drug therapy: Secondary | ICD-10-CM | POA: Diagnosis not present

## 2018-08-05 DIAGNOSIS — E785 Hyperlipidemia, unspecified: Secondary | ICD-10-CM | POA: Diagnosis not present

## 2018-08-05 DIAGNOSIS — C9000 Multiple myeloma not having achieved remission: Secondary | ICD-10-CM

## 2018-08-05 DIAGNOSIS — A0472 Enterocolitis due to Clostridium difficile, not specified as recurrent: Secondary | ICD-10-CM | POA: Diagnosis not present

## 2018-08-05 DIAGNOSIS — Z7982 Long term (current) use of aspirin: Secondary | ICD-10-CM | POA: Diagnosis not present

## 2018-08-05 DIAGNOSIS — Z5112 Encounter for antineoplastic immunotherapy: Secondary | ICD-10-CM | POA: Diagnosis not present

## 2018-08-05 LAB — CMP (CANCER CENTER ONLY)
ALT: 17 U/L (ref 10–47)
AST: 24 U/L (ref 11–38)
Albumin: 3 g/dL — ABNORMAL LOW (ref 3.5–5.0)
Alkaline Phosphatase: 44 U/L (ref 26–84)
Anion gap: 0 — ABNORMAL LOW (ref 5–15)
BUN: 18 mg/dL (ref 7–22)
CO2: 27 mmol/L (ref 18–33)
Calcium: 9.3 mg/dL (ref 8.0–10.3)
Chloride: 111 mmol/L — ABNORMAL HIGH (ref 98–108)
Creatinine: 0.8 mg/dL (ref 0.60–1.20)
Glucose, Bld: 97 mg/dL (ref 73–118)
Potassium: 3.3 mmol/L (ref 3.3–4.7)
SODIUM: 136 mmol/L (ref 128–145)
Total Bilirubin: 0.6 mg/dL (ref 0.2–1.6)
Total Protein: 9.3 g/dL — ABNORMAL HIGH (ref 6.4–8.1)

## 2018-08-05 LAB — CBC WITH DIFFERENTIAL (CANCER CENTER ONLY)
BASOS ABS: 0 10*3/uL (ref 0.0–0.1)
Basophils Relative: 1 %
EOS ABS: 0.2 10*3/uL (ref 0.0–0.5)
Eosinophils Relative: 8 %
HCT: 31.7 % — ABNORMAL LOW (ref 34.8–46.6)
Hemoglobin: 10.1 g/dL — ABNORMAL LOW (ref 11.6–15.9)
LYMPHS ABS: 0.7 10*3/uL — AB (ref 0.9–3.3)
LYMPHS PCT: 31 %
MCH: 27.9 pg (ref 26.0–34.0)
MCHC: 31.9 g/dL — ABNORMAL LOW (ref 32.0–36.0)
MCV: 87.6 fL (ref 81.0–101.0)
MONO ABS: 0.3 10*3/uL (ref 0.1–0.9)
Monocytes Relative: 14 %
Neutro Abs: 1.1 10*3/uL — ABNORMAL LOW (ref 1.5–6.5)
Neutrophils Relative %: 46 %
Platelet Count: 137 10*3/uL — ABNORMAL LOW (ref 145–400)
RBC: 3.62 MIL/uL — AB (ref 3.70–5.32)
RDW: 15.4 % (ref 11.1–15.7)
WBC: 2.3 10*3/uL — AB (ref 3.9–10.0)

## 2018-08-05 MED ORDER — LENALIDOMIDE 20 MG PO CAPS: ORAL_CAPSULE | ORAL | 4 refills | Status: DC

## 2018-08-05 MED ORDER — PROCHLORPERAZINE MALEATE 10 MG PO TABS: 10.0000 mg | ORAL_TABLET | Freq: Four times a day (QID) | ORAL | 1 refills | Status: DC | PRN

## 2018-08-05 MED ORDER — PROCHLORPERAZINE MALEATE 10 MG PO TABS
10.0000 mg | ORAL_TABLET | Freq: Once | ORAL | Status: AC
  Administered 2018-08-05: 10 mg via ORAL

## 2018-08-05 MED ORDER — POTASSIUM CHLORIDE CRYS ER 20 MEQ PO TBCR: 20.0000 meq | EXTENDED_RELEASE_TABLET | Freq: Every day | ORAL | 1 refills | Status: DC

## 2018-08-05 MED ORDER — PROCHLORPERAZINE MALEATE 10 MG PO TABS
ORAL_TABLET | ORAL | Status: AC
Start: 2018-08-05 — End: ?
  Filled 2018-08-05: qty 1

## 2018-08-05 MED ORDER — BORTEZOMIB CHEMO SQ INJECTION 3.5 MG (2.5MG/ML)
1.3000 mg/m2 | Freq: Once | INTRAMUSCULAR | Status: AC
  Administered 2018-08-05: 2.5 mg via SUBCUTANEOUS
  Filled 2018-08-05: qty 1

## 2018-08-05 MED ORDER — DEXAMETHASONE 4 MG PO TABS: ORAL_TABLET | ORAL | 2 refills | Status: DC

## 2018-08-05 MED ORDER — AMLODIPINE BESYLATE 10 MG PO TABS: 10.0000 mg | ORAL_TABLET | Freq: Every day | ORAL | 1 refills | Status: DC

## 2018-08-05 NOTE — Patient Instructions (Addendum)
PICK UP COMPAZINE AND DECADRON AT [facility] - COMPAZINE TAKE AS NEEDED ACCORDING TO THE BOTTLE INSTRUCTIONS FOR NAUSEA AND FOLLOW THE BOTTLE FOR THE DECADRON INSTRUCTIONS AND SAVE THE BOTTLE FOR THE NEXT TREATMENT  Bortezomib injection What is this medicine? BORTEZOMIB (bor TEZ oh mib) is a medicine that targets proteins in cancer cells and stops the cancer cells from growing. It is used to treat multiple myeloma and mantle-cell lymphoma. This medicine may be used for other purposes; ask your health care provider or pharmacist if you have questions. COMMON BRAND NAME(S): Velcade What should I tell my health care provider before I take this medicine? They need to know if you have any of these conditions: -diabetes -heart disease -irregular heartbeat -liver disease -on hemodialysis -low blood counts, like low white blood cells, platelets, or hemoglobin -peripheral neuropathy -taking medicine for blood pressure -an unusual or allergic reaction to bortezomib, mannitol, boron, other medicines, foods, dyes, or preservatives -pregnant or trying to get pregnant -breast-feeding How should I use this medicine? This medicine is for injection into a vein or for injection under the skin. It is given by a health care professional in a hospital or clinic setting. Talk to your pediatrician regarding the use of this medicine in children. Special care may be needed. Overdosage: If you think you have taken too much of this medicine contact a poison control center or emergency room at once. NOTE: This medicine is only for you. Do not share this medicine with others. What if I miss a dose? It is important not to miss your dose. Call your doctor or health care professional if you are unable to keep an appointment. What may interact with this medicine? This medicine may interact with the following medications: -ketoconazole -rifampin -ritonavir -St. John's Wort This list may not describe all  possible interactions. Give your health care provider a list of all the medicines, herbs, non-prescription drugs, or dietary supplements you use. Also tell them if you smoke, drink alcohol, or use illegal drugs. Some items may interact with your medicine. What should I watch for while using this medicine? You may get drowsy or dizzy. Do not drive, use machinery, or do anything that needs mental alertness until you know how this medicine affects you. Do not stand or sit up quickly, especially if you are an older patient. This reduces the risk of dizzy or fainting spells. In some cases, you may be given additional medicines to help with side effects. Follow all directions for their use. Call your doctor or health care professional for advice if you get a fever, chills or sore throat, or other symptoms of a cold or flu. Do not treat yourself. This drug decreases your body's ability to fight infections. Try to avoid being around people who are sick. This medicine may increase your risk to bruise or bleed. Call your doctor or health care professional if you notice any unusual bleeding. You may need blood work done while you are taking this medicine. In some patients, this medicine may cause a serious brain infection that may cause death. If you have any problems seeing, thinking, speaking, walking, or standing, tell your doctor right away. If you cannot reach your doctor, urgently seek other source of medical care. Check with your doctor or health care professional if you get an attack of severe diarrhea, nausea and vomiting, or if you sweat a lot. The loss of too much body fluid can make it dangerous for you to take this  medicine. Do not become pregnant while taking this medicine or for at least 2 months after stopping it. Women should inform their doctor if they wish to become pregnant or think they might be pregnant. Men should not father a child while taking this medicine and for at least 2 months after  stopping it. There is a potential for serious side effects to an unborn child. Talk to your health care professional or pharmacist for more information. Do not breast-feed an infant while taking this medicine or for 2 months after stopping it. This medicine may interfere with the ability to have a child. You should talk with your doctor or health care professional if you are concerned about your fertility. What side effects may I notice from receiving this medicine? Side effects that you should report to your doctor or health care professional as soon as possible: -allergic reactions like skin rash, itching or hives, swelling of the face, lips, or tongue -breathing problems -changes in hearing -changes in vision -fast, irregular heartbeat -feeling faint or lightheaded, falls -pain, tingling, numbness in the hands or feet -right upper belly pain -seizures -swelling of the ankles, feet, hands -unusual bleeding or bruising -unusually weak or tired -vomiting -yellowing of the eyes or skin Side effects that usually do not require medical attention (report to your doctor or health care professional if they continue or are bothersome): -changes in emotions or moods -constipation -diarrhea -loss of appetite -headache -irritation at site where injected -nausea This list may not describe all possible side effects. Call your doctor for medical advice about side effects. You may report side effects to FDA at 1-800-FDA-1088. Where should I keep my medicine? This drug is given in a hospital or clinic and will not be stored at home. NOTE: This sheet is a summary. It may not cover all possible information. If you have questions about this medicine, talk to your doctor, pharmacist, or health care provider.  2018 Elsevier/Gold Standard (2016-09-24 15:53:51)

## 2018-08-05 NOTE — Progress Notes (Signed)
Ok to treat with ANC per MD 

## 2018-08-05 NOTE — Progress Notes (Signed)
Hematology and Oncology Follow Up Visit  Tami [name] [id number] 1956/03/27 62 y.o. 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 is back for follow-up.   We did go ahead and get her work-up done for the myeloma.  We did have a bone marrow biopsy done.  This was done on 07/28/2018.  The pathology report ([id number]) showed plasma cell myeloma.  She had 30% plasma cells in the aspirate.  The cytogenetics and FISH are not yet back.    We did also do a PET scan on her.  This was done on 08/01/2018.  The report shows some mild uptake in the sternum.  No other areas of bone involvement were noted that were suspicious for a myeloma.  She is already started her Revlimid.  As such, we will start the Velcade today.  Her daughter was listening on the cell phone.    She feels okay.  She is eating okay.  Thankfully, she has had to not go back to the emergency room since we last saw her.  So far, she is tolerated the Revlimid without any problems.  She was just taking 81 mg of aspirin.  I told her that she has to take 3 baby aspirin.  She is also taking the Famvir.  Overall, her performance status is ECOG 1.  Medications:  Current Outpatient Medications:  .  acetaminophen (TYLENOL) 325 MG tablet, Take 650 mg by mouth., Disp: , Rfl:  .  albuterol (PROVENTIL HFA;VENTOLIN HFA) 108 (90 Base) MCG/ACT inhaler, Inhale 2 puffs into the lungs every 6 (six) hours as needed for wheezing or shortness of breath., Disp: 1 Inhaler, Rfl: 1 .  amLODipine (NORVASC) 10 MG tablet, Take 1 tablet (10 mg total) by mouth daily., Disp: 90 tablet, Rfl: 1 .  aspirin EC 81 MG tablet, Take 1 tablet (81 mg total) by mouth daily., Disp: 90 tablet, Rfl: 1 .  carvedilol (COREG) 25 MG tablet, Take 1 tablet (25 mg total) by mouth 2 (two) times daily with a meal., Disp: 180 tablet, Rfl: 1 .  cephALEXin (KEFLEX)  500 MG capsule, Take 1 capsule (500 mg total) by mouth 2 (two) times daily., Disp: 10 capsule, Rfl: 0 .  conjugated estrogens (PREMARIN) vaginal cream, Place vaginally., Disp: , Rfl:  .  dexamethasone (DECADRON) 4 MG tablet, Take 5 pills at one time with food ONCE a WEEK!!, Disp: 100 tablet, Rfl: 2 .  dicyclomine (BENTYL) 20 MG tablet, Take 1 tablet (20 mg total) by mouth 3 (three) times daily as needed for spasms., Disp: 90 tablet, Rfl: 1 .  famciclovir (FAMVIR) 250 MG tablet, Take 1 tablet (250 mg total) by mouth daily., Disp: 30 tablet, Rfl: 12 .  fluticasone (FLONASE) 50 MCG/ACT nasal spray, Place 2 sprays into both nostrils daily., Disp: 16 g, Rfl: 6 .  HYDROcodone-acetaminophen (NORCO/VICODIN) 5-325 MG tablet, Take 1 tablet by mouth every 4 (four) hours as needed., Disp: 15 tablet, Rfl: 0 .  hyoscyamine (LEVSIN SL) 0.125 MG SL tablet, Place 1 tablet (0.125 mg total) under the tongue every 4 (four) hours as needed., Disp: 30 tablet, Rfl: 2 .  Lactobacillus (ACIDOPHILUS) 100 MG CAPS, Take 1 capsule (100 mg total) by mouth daily., Disp: , Rfl: 0 .  lenalidomide (REVLIMID) 20 MG capsule, Take 1 pill daily for 3 weeks on and 1 week off.  Take at bedtime [id number], Disp: 21 capsule, Rfl: 4 .  levocetirizine (XYZAL) 5 MG tablet, Take 1 tablet (5 mg total) by mouth every evening., Disp: 30 tablet, Rfl: 2 .  lidocaine (LIDODERM) 5 %, Place 1 patch onto the skin daily. Remove & Discard patch within 12 hours or as directed by MD, Disp: 30 patch, Rfl: 2 .  ondansetron (ZOFRAN) 4 MG tablet, Take 1 tablet (4 mg total) by mouth every 8 (eight) hours as needed for nausea or vomiting., Disp: 30 tablet, Rfl: 1 .  pantoprazole (PROTONIX) 40 MG tablet, Take 1 tablet (40 mg total) by mouth 2 (two) times daily., Disp: 180 tablet, Rfl: 1 .  potassium chloride SA (KLOR-CON M20) 20 MEQ tablet, Take 1 tablet (20 mEq total) by mouth daily., Disp: 90 tablet, Rfl: 1 .  prochlorperazine (COMPAZINE) 10 MG tablet, Take 1  tablet (10 mg total) by mouth every 6 (six) hours as needed (Nausea or vomiting)., Disp: 30 tablet, Rfl: 1 .  sucralfate (CARAFATE) 1 g tablet, Take 1 tablet (1 g total) by mouth 4 (four) times daily -  with meals and at bedtime., Disp: 120 tablet, Rfl: 0 .  topiramate (TOPAMAX) 25 MG tablet, Take 1 tablet (25 mg total) by mouth 2 (two) times daily., Disp: 180 tablet, Rfl: 0  Allergies:  Allergies  Allergen Reactions  . Benazepril Anaphylaxis and Swelling    angioedema Throat and lip swelling  . Codeine Nausea And Vomiting  . Morphine     Redness and hives noted post IV admin on 07/05/17  . Ondansetron Hcl     Redness and hives post IV admin on 07/05/17    Past Medical History, Surgical history, Social history, and Family History were reviewed and updated.  Review of Systems: Review of Systems  Constitutional: Negative.   HENT: Negative.   Eyes: Negative.   Respiratory: Negative.   Cardiovascular: Negative.   Gastrointestinal: Negative.   Genitourinary: Negative.   Musculoskeletal: Negative.   Skin: Negative.   Neurological: Negative.   Endo/Heme/Allergies: Negative.   Psychiatric/Behavioral: Negative.      Physical Exam:  weight is 177 lb (80.3 kg). Her oral temperature is 98.1 F (36.7 C). Her blood pressure is 147/81 (abnormal) and her pulse is 84. Her respiration is 16 and oxygen saturation is 99%.   Wt Readings from Last 3 Encounters:  08/05/18 177 lb 12.8 oz (80.6 kg)  08/05/18 177 lb (80.3 kg)  07/25/18 175 lb 3.2 oz (79.5 kg)     Physical Exam  Constitutional: She is oriented to person, place, and time.  HENT:  Head: Normocephalic and atraumatic.  Mouth/Throat: Oropharynx is clear and moist.  Eyes: Pupils are equal, round, and reactive to light. EOM are normal.  Neck: Normal range of motion.  Cardiovascular: Normal rate, regular rhythm and normal heart sounds.  Pulmonary/Chest: Effort normal and breath sounds normal.  Abdominal: Soft. Bowel sounds are  normal.  Musculoskeletal: Normal range of motion. She exhibits no edema, tenderness or deformity.  Lymphadenopathy:    She has no cervical adenopathy.  Neurological: She is alert and oriented to person, place, and time.  Skin: Skin is warm and dry. No rash noted. No erythema.  Psychiatric: She has a normal mood and affect. Her behavior is normal. Judgment and thought content normal.  Vitals reviewed.  a.   Lab Results  Component Value Date   WBC 2.3 (L) 08/05/2018   HGB 10.1 (L) 08/05/2018   HCT 31.7 (L) 08/05/2018   MCV  87.6 08/05/2018   PLT 137 (L) 08/05/2018     Chemistry      Component Value Date/Time   NA 136 08/05/2018 0835   NA 143 11/08/2017 1127   NA 140 12/31/2016 1000   K 3.3 08/05/2018 0835   K 4.0 11/08/2017 1127   K 3.6 12/31/2016 1000   CL 111 (H) 08/05/2018 0835   CL 107 11/08/2017 1127   CO2 27 08/05/2018 0835   CO2 25 11/08/2017 1127   CO2 24 12/31/2016 1000   BUN 18 08/05/2018 0835   BUN 19 11/08/2017 1127   BUN 22.3 12/31/2016 1000   CREATININE 0.80 08/05/2018 0835   CREATININE 0.9 11/08/2017 1127   CREATININE 0.8 12/31/2016 1000      Component Value Date/Time   CALCIUM 9.3 08/05/2018 0835   CALCIUM 9.9 11/08/2017 1127   CALCIUM 9.5 12/31/2016 1000   ALKPHOS 44 08/05/2018 0835   ALKPHOS 44 11/08/2017 1127   ALKPHOS 51 12/31/2016 1000   AST 24 08/05/2018 0835   AST 28 12/31/2016 1000   ALT 17 08/05/2018 0835   ALT 36 11/08/2017 1127   ALT 16 12/31/2016 1000   BILITOT 0.6 08/05/2018 0835   BILITOT 0.46 12/31/2016 1000         Impression and Plan: Ms. Norment is a 62 year old African-American female.  We now have a diagnosis of myeloma on her.  We will have to move ahead and get her started on treatment.  I think that treatment will work.  She will start her Velcade today.  We will start off with weekly Velcade for 3 weeks on and one-week off.  I think we should also consider Xgeva.  I will start the Xgeva when I see her back.  I think we  can do the [facility].  I answered her daughter's questions that she asked over the cell phone.  We will get her back to see [place] when she starts her second cycle of treatment.  This will be in about 3-4 weeks.  Ng is finalized for treatment.  I spent about 35 minutes with her.  All the time spent face-to-face.  I counseled her and help coordinate all of her care for future chemotherapy.      [name], MD 9/27/20192:58 PM

## 2018-08-05 NOTE — Patient Instructions (Addendum)
Vitamin D 2000 IU daily Vitamin C 500 mg daily Vitamin B complex daily or a Multivitamin with minerals and iron Hypertension Hypertension is another name for high blood pressure. High blood pressure forces your heart to work harder to pump blood. This can cause problems over time. There are two numbers in a blood pressure reading. There is a top number (systolic) over a bottom number (diastolic). It is best to have a blood pressure below 120/80. Healthy choices can help lower your blood pressure. You may need medicine to help lower your blood pressure if:  Your blood pressure cannot be lowered with healthy choices.  Your blood pressure is higher than 130/80.  Follow these instructions at home: Eating and drinking  If directed, follow the DASH eating plan. This diet includes: ? Filling half of your plate at each meal with fruits and vegetables. ? Filling one quarter of your plate at each meal with whole grains. Whole grains include whole wheat pasta, brown rice, and whole grain bread. ? Eating or drinking low-fat dairy products, such as skim milk or low-fat yogurt. ? Filling one quarter of your plate at each meal with low-fat (lean) proteins. Low-fat proteins include fish, skinless chicken, eggs, beans, and tofu. ? Avoiding fatty meat, cured and processed meat, or chicken with skin. ? Avoiding premade or processed food.  Eat less than 1,500 mg of salt (sodium) a day.  Limit alcohol use to no more than 1 drink a day for nonpregnant women and 2 drinks a day for men. One drink equals 12 oz of beer, 5 oz of wine, or 1 oz of hard liquor. Lifestyle  Work with your doctor to stay at a healthy weight or to lose weight. Ask your doctor what the best weight is for you.  Get at least 30 minutes of exercise that causes your heart to beat faster (aerobic exercise) most days of the week. This may include walking, swimming, or biking.  Get at least 30 minutes of exercise that strengthens your muscles  (resistance exercise) at least 3 days a week. This may include lifting weights or pilates.  Do not use any products that contain nicotine or tobacco. This includes cigarettes and e-cigarettes. If you need help quitting, ask your doctor.  Check your blood pressure at home as told by your doctor.  Keep all follow-up visits as told by your doctor. This is important. Medicines  Take over-the-counter and prescription medicines only as told by your doctor. Follow directions carefully.  Do not skip doses of blood pressure medicine. The medicine does not work as well if you skip doses. Skipping doses also puts you at risk for problems.  Ask your doctor about side effects or reactions to medicines that you should watch for. Contact a doctor if:  You think you are having a reaction to the medicine you are taking.  You have headaches that keep coming back (recurring).  You feel dizzy.  You have swelling in your ankles.  You have trouble with your vision. Get help right away if:  You get a very bad headache.  You start to feel confused.  You feel weak or numb.  You feel faint.  You get very bad pain in your: ? Chest. ? Belly (abdomen).  You throw up (vomit) more than once.  You have trouble breathing. Summary  Hypertension is another name for high blood pressure.  Making healthy choices can help lower blood pressure. If your blood pressure cannot be controlled with healthy choices,  you may need to take medicine. This information is not intended to replace advice given to you by your health care provider. Make sure you discuss any questions you have with your health care provider. Document Released: 04/13/2008 Document Revised: 09/23/2016 Document Reviewed: 09/23/2016 Elsevier Interactive Patient Education  [employer].

## 2018-08-06 LAB — IGG, IGA, IGM
IGG (IMMUNOGLOBIN G), SERUM: 4466 mg/dL — AB (ref 700–1600)
IgA: 294 mg/dL (ref 87–352)
IgM (Immunoglobulin M), Srm: 151 mg/dL (ref 26–217)

## 2018-08-07 NOTE — Assessment & Plan Note (Signed)
Recent testing negative and her diarrhea has improved. Encouraged to continue pobiotics and fiber supplements.

## 2018-08-07 NOTE — Assessment & Plan Note (Signed)
Encouraged heart healthy diet, increase exercise, avoid trans fats, consider a krill oil cap daily 

## 2018-08-07 NOTE — Progress Notes (Signed)
Subjective:    [name], female    DOB: 1955/11/15, 62 y.o.   MRN: [id number]  No chief complaint on file.   HPI Patient is in today for follow up and oveall she feels she is improving. Her diarrhea has actually slowed down and she denies any bloody or tarry stool. No anorexia or significant abdominal pain. She still notes fatigue but is tolerating her cancer treatments and denies any new concerns. Denies CP/palp/SOB/HA/congestion/fevers/GI or GU c/o. Taking meds as prescribed  Past Medical History:  Diagnosis Date  . Abnormal SPEP 07/26/2016  . Arthritis    knees, hands  . Back pain 07/14/2016  . Bowel obstruction ([place]) 11/2014  . C. difficile diarrhea   . CHF (congestive heart failure) ([place])   . Congestive heart failure ([place]) 02/24/2015   S/p pacemaker  . Depression   . Elevated sed rate 08/15/2017  . Epigastric pain 03/22/2017  . GERD (gastroesophageal reflux disease)   . Hyperlipidemia   . Hypertension   . 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 Low back pain 07/14/2016  . 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 Multiple myeloma ([place]) 07/20/2018  . Multiple myeloma not having achieved remission ([place]) 07/20/2018  . Myalgia 12/31/2016  . Obstructive sleep apnea 03/31/2015  . SOB (shortness of breath) 04/09/2016  . Stroke [facility])    TIAs    Past Surgical History:  Procedure Laterality Date  . ABDOMINAL HYSTERECTOMY     menorraghia, 2006, total  . CHOLECYSTECTOMY    . KNEE SURGERY     right, repair torn torn cartialage  . PACEMAKER INSERTION  08/2014  . TONSILLECTOMY    . TUBAL LIGATION      Family History  Problem Relation Age of Onset  . Diabetes Mother   . Hypertension Mother   . Heart disease Mother        s/p 1 stent  . Hyperlipidemia Mother   . Arthritis Mother   . Cancer Father        COLON  . Colon cancer Father 30  . Irritable bowel syndrome Sister   . Hyperlipidemia Daughter   . Hypertension Daughter   .  Hypertension Maternal Grandmother   . Arthritis Maternal Grandmother   . Heart disease Maternal Grandfather        MI  . Hypertension Maternal Grandfather   . Arthritis Maternal Grandfather   . Hypertension Son     Social History   Socioeconomic History  . Marital status: Single    Spouse name: [name]  . Number of children: [name]  . Years of education: [name]  . Highest education level: [name]  Occupational History  . [name]  Social Needs  . Financial resource strain: [name]  . Food insecurity:    Worry: [name]    Inability: [name]  . Transportation needs:    Medical: [name]    Non-medical: [name]  Tobacco Use  . Smoking status: Never Smoker  . Smokeless tobacco: Never Used  Substance and Sexual Activity  . Alcohol use: No    Alcohol/week: 0.0 standard drinks  . Drug use: No  . Sexual activity: [name]  Lifestyle  . Physical activity:    Days per week: [name]    Minutes per session: [name]  . Stress: [name]  Relationships  . Social connections:  Talks on phone: [name]    Gets together: [name]    Attends religious service: [name]    Active member of club or organization: [name]    Attends meetings of clubs or organizations: [name]    Relationship status: [name]  . Intimate partner violence:    Fear of current or ex partner: [name]    Emotionally abused: [name]    Physically abused: [name]    Forced sexual activity: [name]  Other Topics Concern  . [name]  Social History Narrative  . [name]    Outpatient Medications Prior to Visit  Medication Sig Dispense Refill  . acetaminophen (TYLENOL) 325 MG tablet Take 650 mg by mouth.    Marland Kitchen albuterol (PROVENTIL HFA;VENTOLIN HFA) 108 (90 Base) MCG/ACT inhaler Inhale 2 puffs into the lungs every 6 (six) hours as needed for wheezing or shortness of breath. 1 Inhaler 1  . aspirin EC 81 MG  tablet Take 1 tablet (81 mg total) by mouth daily. 90 tablet 1  . carvedilol (COREG) 25 MG tablet Take 1 tablet (25 mg total) by mouth 2 (two) times daily with a meal. 180 tablet 1  . cephALEXin (KEFLEX) 500 MG capsule Take 1 capsule (500 mg total) by mouth 2 (two) times daily. 10 capsule 0  . conjugated estrogens (PREMARIN) vaginal cream Place vaginally.    Marland Kitchen dexamethasone (DECADRON) 4 MG tablet Take 5 pills at one time with food ONCE a WEEK!! 100 tablet 2  . dicyclomine (BENTYL) 20 MG tablet Take 1 tablet (20 mg total) by mouth 3 (three) times daily as needed for spasms. 90 tablet 1  . famciclovir (FAMVIR) 250 MG tablet Take 1 tablet (250 mg total) by mouth daily. 30 tablet 12  . fluticasone (FLONASE) 50 MCG/ACT nasal spray Place 2 sprays into both nostrils daily. 16 g 6  . HYDROcodone-acetaminophen (NORCO/VICODIN) 5-325 MG tablet Take 1 tablet by mouth every 4 (four) hours as needed. 15 tablet 0  . hyoscyamine (LEVSIN SL) 0.125 MG SL tablet Place 1 tablet (0.125 mg total) under the tongue every 4 (four) hours as needed. 30 tablet 2  . Lactobacillus (ACIDOPHILUS) 100 MG CAPS Take 1 capsule (100 mg total) by mouth daily.  0  . lenalidomide (REVLIMID) 20 MG capsule Take 1 pill daily for 3 weeks on and 1 week off.  Take at bedtime ASTM#[id number] 21 capsule 4  . levocetirizine (XYZAL) 5 MG tablet Take 1 tablet (5 mg total) by mouth every evening. 30 tablet 2  . lidocaine (LIDODERM) 5 % Place 1 patch onto the skin daily. Remove & Discard patch within 12 hours or as directed by MD 30 patch 2  . ondansetron (ZOFRAN) 4 MG tablet Take 1 tablet (4 mg total) by mouth every 8 (eight) hours as needed for nausea or vomiting. 30 tablet 1  . pantoprazole (PROTONIX) 40 MG tablet Take 1 tablet (40 mg total) by mouth 2 (two) times daily. 180 tablet 1  . prochlorperazine (COMPAZINE) 10 MG tablet Take 1 tablet (10 mg total) by mouth every 6 (six) hours as needed (Nausea or vomiting). 30 tablet 1  . sucralfate (CARAFATE)  1 g tablet Take 1 tablet (1 g total) by mouth 4 (four) times daily -  with meals and at bedtime. 120 tablet 0  . topiramate (TOPAMAX) 25 MG tablet Take 1 tablet (25 mg total) by mouth 2 (  two) times daily. 180 tablet 0  . amLODipine (NORVASC) 10 MG tablet Take 1 tablet (10 mg total) by mouth daily. 90 tablet 1  . potassium chloride SA (KLOR-CON M20) 20 MEQ tablet Take 1 tablet (20 mEq total) by mouth daily. 90 tablet 0   No facility-administered medications prior to visit.     Allergies  Allergen Reactions  . Benazepril Anaphylaxis and Swelling    angioedema Throat and lip swelling  . Codeine Nausea And Vomiting  . Morphine     Redness and hives noted post IV admin on 07/05/17  . Ondansetron Hcl     Redness and hives post IV admin on 07/05/17    Review of Systems  Constitutional: Positive for malaise/fatigue. Negative for fever.  HENT: Negative for congestion.   Eyes: Negative for blurred vision.  Respiratory: Negative for shortness of breath.   Cardiovascular: Negative for chest pain, palpitations and leg swelling.  Gastrointestinal: Negative for abdominal pain, blood in stool and nausea.  Genitourinary: Negative for dysuria and frequency.  Musculoskeletal: Negative for falls.  Skin: Negative for rash.  Neurological: Negative for dizziness, loss of consciousness and headaches.  Endo/Heme/Allergies: Negative for environmental allergies.  Psychiatric/Behavioral: Negative for depression. The patient is not nervous/anxious.        Objective:    Physical Exam  Constitutional: She is oriented to person, place, and time. She appears well-developed and well-nourished. No distress.  HENT:  Head: Normocephalic and atraumatic.  Nose: Nose normal.  Eyes: Right eye exhibits no discharge. Left eye exhibits no discharge.  Neck: Normal range of motion. Neck supple.  Cardiovascular: Normal rate and regular rhythm.  No murmur heard. Pulmonary/Chest: Effort normal and breath sounds  normal.  Abdominal: Soft. Bowel sounds are normal. There is no tenderness.  Musculoskeletal: She exhibits no edema.  Neurological: She is alert and oriented to person, place, and time.  Skin: Skin is warm and dry.  Psychiatric: She has a normal mood and affect.  Nursing note and vitals reviewed.   BP 128/80 (BP Location: Left Arm, Patient Position: Sitting, Cuff Size: Normal)   Pulse 90   Temp 97.6 F (36.4 C) (Oral)   Resp 18   Wt 177 lb 12.8 oz (80.6 kg)   SpO2 96%   BMI 30.52 kg/m  Wt Readings from Last 3 Encounters:  08/05/18 177 lb 12.8 oz (80.6 kg)  08/05/18 177 lb (80.3 kg)  07/25/18 175 lb 3.2 oz (79.5 kg)     Lab Results  Component Value Date   WBC 2.3 (L) 08/05/2018   HGB 10.1 (L) 08/05/2018   HCT 31.7 (L) 08/05/2018   PLT 137 (L) 08/05/2018   GLUCOSE 97 08/05/2018   CHOL 137 02/15/2018   TRIG 100.0 02/15/2018   HDL 45.90 02/15/2018   LDLCALC 72 02/15/2018   ALT 17 08/05/2018   AST 24 08/05/2018   NA 136 08/05/2018   K 3.3 08/05/2018   CL 111 (H) 08/05/2018   CREATININE 0.80 08/05/2018   BUN 18 08/05/2018   CO2 27 08/05/2018   TSH 1.79 02/15/2018   INR 0.90 07/28/2018    Lab Results  Component Value Date   TSH 1.79 02/15/2018   Lab Results  Component Value Date   WBC 2.3 (L) 08/05/2018   HGB 10.1 (L) 08/05/2018   HCT 31.7 (L) 08/05/2018   MCV 87.6 08/05/2018   PLT 137 (L) 08/05/2018   Lab Results  Component Value Date   NA 136 08/05/2018   K 3.3 08/05/2018  CHLORIDE 108 12/31/2016   CO2 27 08/05/2018   GLUCOSE 97 08/05/2018   BUN 18 08/05/2018   CREATININE 0.80 08/05/2018   BILITOT 0.6 08/05/2018   ALKPHOS 44 08/05/2018   AST 24 08/05/2018   ALT 17 08/05/2018   PROT 9.3 (H) 08/05/2018   ALBUMIN 3.0 (L) 08/05/2018   CALCIUM 9.3 08/05/2018   ANIONGAP 0.0 (L) 08/05/2018   EGFR >90 12/31/2016   GFR 117.96 07/25/2018   Lab Results  Component Value Date   CHOL 137 02/15/2018   Lab Results  Component Value Date   HDL 45.90  02/15/2018   Lab Results  Component Value Date   LDLCALC 72 02/15/2018   Lab Results  Component Value Date   TRIG 100.0 02/15/2018   Lab Results  Component Value Date   CHOLHDL 3 02/15/2018   No results found for: HGBA1C     Assessment & Plan:   Problem List Items Addressed This Visit    Multiple myeloma not having achieved remission ([place]) (Chronic)    Is tolerating her treatment and following closely with oncology at the present time.       Hyperlipidemia    Encouraged heart healthy diet, increase exercise, avoid trans fats, consider a krill oil cap daily      Relevant Medications   amLODipine (NORVASC) 10 MG tablet   Hypertension    Well controlled, no changes to meds. Encouraged heart healthy diet such as the DASH diet and exercise as tolerated.       Relevant Medications   amLODipine (NORVASC) 10 MG tablet   C. difficile colitis    Recent testing negative and her diarrhea has improved. Encouraged to continue pobiotics and fiber supplements.          I am having Nadiya Pieratt maintain her conjugated estrogens, acetaminophen, carvedilol, fluticasone, pantoprazole, aspirin EC, albuterol, dicyclomine, ondansetron, lidocaine, topiramate, levocetirizine, sucralfate, HYDROcodone-acetaminophen, cephALEXin, Acidophilus, famciclovir, hyoscyamine, lenalidomide, prochlorperazine, dexamethasone, amLODipine, and potassium chloride SA.  Meds ordered this encounter  Medications  . amLODipine (NORVASC) 10 MG tablet    Sig: Take 1 tablet (10 mg total) by mouth daily.    Dispense:  90 tablet    Refill:  1  . potassium chloride SA (KLOR-CON M20) 20 MEQ tablet    Sig: Take 1 tablet (20 mEq total) by mouth daily.    Dispense:  90 tablet    Refill:  1     Penni Homans, MD

## 2018-08-07 NOTE — Assessment & Plan Note (Signed)
Is tolerating her treatment and following closely with oncology at the present time.

## 2018-08-07 NOTE — Assessment & Plan Note (Signed)
Well controlled, no changes to meds. Encouraged heart healthy diet such as the DASH diet and exercise as tolerated.  °

## 2018-08-08 ENCOUNTER — Other Ambulatory Visit: Payer: Self-pay | Admitting: *Deleted

## 2018-08-08 LAB — KAPPA/LAMBDA LIGHT CHAINS
KAPPA FREE LGHT CHN: 93.6 mg/L — AB (ref 3.3–19.4)
KAPPA, LAMDA LIGHT CHAIN RATIO: 6.73 — AB (ref 0.26–1.65)
LAMDA FREE LIGHT CHAINS: 13.9 mg/L (ref 5.7–26.3)

## 2018-08-08 MED ORDER — LENALIDOMIDE 20 MG PO CAPS: ORAL_CAPSULE | ORAL | 1 refills | Status: DC

## 2018-08-09 ENCOUNTER — Encounter (HOSPITAL_COMMUNITY): Payer: Self-pay

## 2018-08-09 ENCOUNTER — Ambulatory Visit: Payer: Medicare Other | Admitting: Physical Therapy

## 2018-08-09 LAB — IMMUNOFIXATION REFLEX, SERUM
IGA: 289 mg/dL (ref 87–352)
IGG (IMMUNOGLOBIN G), SERUM: 4166 mg/dL — AB (ref 700–1600)
IGM (IMMUNOGLOBULIN M), SRM: 150 mg/dL (ref 26–217)

## 2018-08-09 LAB — PROTEIN ELECTROPHORESIS, SERUM, WITH REFLEX
A/G Ratio: 0.6 — ABNORMAL LOW (ref 0.7–1.7)
ALBUMIN ELP: 3.3 g/dL (ref 2.9–4.4)
ALPHA-1-GLOBULIN: 0.3 g/dL (ref 0.0–0.4)
Alpha-2-Globulin: 0.9 g/dL (ref 0.4–1.0)
Beta Globulin: 1.1 g/dL (ref 0.7–1.3)
Gamma Globulin: 3.5 g/dL — ABNORMAL HIGH (ref 0.4–1.8)
Globulin, Total: 5.7 g/dL — ABNORMAL HIGH (ref 2.2–3.9)
M-Spike, %: 2.8 g/dL — ABNORMAL HIGH
SPEP INTERP: 0
TOTAL PROTEIN ELP: 9 g/dL — AB (ref 6.0–8.5)

## 2018-08-12 ENCOUNTER — Other Ambulatory Visit: Payer: Self-pay

## 2018-08-12 ENCOUNTER — Inpatient Hospital Stay: Payer: Medicare Other | Attending: Hematology & Oncology

## 2018-08-12 ENCOUNTER — Inpatient Hospital Stay: Payer: Medicare Other

## 2018-08-12 VITALS — BP 143/78 | HR 92 | Temp 98.0°F | Resp 18

## 2018-08-12 DIAGNOSIS — E041 Nontoxic single thyroid nodule: Secondary | ICD-10-CM | POA: Insufficient documentation

## 2018-08-12 DIAGNOSIS — C9000 Multiple myeloma not having achieved remission: Secondary | ICD-10-CM | POA: Diagnosis not present

## 2018-08-12 DIAGNOSIS — Z79899 Other long term (current) drug therapy: Secondary | ICD-10-CM | POA: Insufficient documentation

## 2018-08-12 DIAGNOSIS — Z5112 Encounter for antineoplastic immunotherapy: Secondary | ICD-10-CM | POA: Diagnosis not present

## 2018-08-12 LAB — CMP (CANCER CENTER ONLY)
ALBUMIN: 3.1 g/dL — AB (ref 3.5–5.0)
ALK PHOS: 42 U/L (ref 26–84)
ALT: 18 U/L (ref 10–47)
AST: 25 U/L (ref 11–38)
Anion gap: 0 — ABNORMAL LOW (ref 5–15)
BUN: 16 mg/dL (ref 7–22)
CALCIUM: 9 mg/dL (ref 8.0–10.3)
CO2: 29 mmol/L (ref 18–33)
Chloride: 109 mmol/L — ABNORMAL HIGH (ref 98–108)
Creatinine: 1 mg/dL (ref 0.60–1.20)
Glucose, Bld: 128 mg/dL — ABNORMAL HIGH (ref 73–118)
Potassium: 3.2 mmol/L — ABNORMAL LOW (ref 3.3–4.7)
Sodium: 135 mmol/L (ref 128–145)
TOTAL PROTEIN: 9.5 g/dL — AB (ref 6.4–8.1)
Total Bilirubin: 0.6 mg/dL (ref 0.2–1.6)

## 2018-08-12 LAB — CBC WITH DIFFERENTIAL (CANCER CENTER ONLY)
BASOS PCT: 1 %
Basophils Absolute: 0 10*3/uL (ref 0.0–0.1)
Eosinophils Absolute: 0.2 10*3/uL (ref 0.0–0.5)
Eosinophils Relative: 7 %
HEMATOCRIT: 34.5 % — AB (ref 34.8–46.6)
HEMOGLOBIN: 10.9 g/dL — AB (ref 11.6–15.9)
Lymphocytes Relative: 36 %
Lymphs Abs: 0.8 10*3/uL — ABNORMAL LOW (ref 0.9–3.3)
MCH: 27.8 pg (ref 26.0–34.0)
MCHC: 31.6 g/dL — AB (ref 32.0–36.0)
MCV: 88 fL (ref 81.0–101.0)
MONOS PCT: 13 %
Monocytes Absolute: 0.3 10*3/uL (ref 0.1–0.9)
NEUTROS ABS: 0.9 10*3/uL — AB (ref 1.5–6.5)
NEUTROS PCT: 43 %
Platelet Count: 148 10*3/uL (ref 145–400)
RBC: 3.92 MIL/uL (ref 3.70–5.32)
RDW: 15.5 % (ref 11.1–15.7)
WBC Count: 2.1 10*3/uL — ABNORMAL LOW (ref 3.9–10.0)

## 2018-08-12 MED ORDER — BORTEZOMIB CHEMO SQ INJECTION 3.5 MG (2.5MG/ML)
1.3000 mg/m2 | Freq: Once | INTRAMUSCULAR | Status: AC
  Administered 2018-08-12: 2.5 mg via SUBCUTANEOUS
  Filled 2018-08-12: qty 1

## 2018-08-12 MED ORDER — PROCHLORPERAZINE MALEATE 10 MG PO TABS
ORAL_TABLET | ORAL | Status: AC
  Filled 2018-08-12: qty 1

## 2018-08-12 MED ORDER — PROCHLORPERAZINE MALEATE 10 MG PO TABS
10.0000 mg | ORAL_TABLET | Freq: Once | ORAL | Status: AC
  Administered 2018-08-12: 10 mg via ORAL

## 2018-08-12 NOTE — Progress Notes (Signed)
Ok to treat with ANC 0.9 per Dr. Maylon Peppers.

## 2018-08-18 ENCOUNTER — Encounter: Payer: Self-pay | Admitting: Medical

## 2018-08-18 ENCOUNTER — Ambulatory Visit (INDEPENDENT_AMBULATORY_CARE_PROVIDER_SITE_OTHER): Payer: Medicare Other | Admitting: Medical

## 2018-08-18 VITALS — BP 122/73 | HR 78 | Temp 98.1°F | Resp 16 | Wt 176.2 lb

## 2018-08-18 DIAGNOSIS — J329 Chronic sinusitis, unspecified: Secondary | ICD-10-CM

## 2018-08-18 DIAGNOSIS — R05 Cough: Secondary | ICD-10-CM

## 2018-08-18 DIAGNOSIS — R059 Cough, unspecified: Secondary | ICD-10-CM

## 2018-08-18 DIAGNOSIS — J029 Acute pharyngitis, unspecified: Secondary | ICD-10-CM

## 2018-08-18 DIAGNOSIS — J40 Bronchitis, not specified as acute or chronic: Secondary | ICD-10-CM

## 2018-08-18 LAB — POC INFLUENZA A&B (BINAX/QUICKVUE)
Influenza A, POC: NEGATIVE
Influenza B, POC: NEGATIVE

## 2018-08-18 LAB — POCT RAPID STREP A (OFFICE): RAPID STREP A SCREEN: NEGATIVE

## 2018-08-18 MED ORDER — FLUTICASONE PROPIONATE 50 MCG/ACT NA SUSP: 2.0000 | Freq: Every day | NASAL | 1 refills | Status: DC

## 2018-08-18 MED ORDER — HYDROCODONE-HOMATROPINE 5-1.5 MG/5ML PO SYRP: 5.0000 mL | ORAL_SOLUTION | Freq: Four times a day (QID) | ORAL | 0 refills | Status: DC | PRN

## 2018-08-18 MED ORDER — AZITHROMYCIN 250 MG PO TABS: ORAL_TABLET | ORAL | 0 refills | Status: DC

## 2018-08-18 MED ORDER — CEFTRIAXONE SODIUM 1 G IJ SOLR
1.0000 g | Freq: Once | INTRAMUSCULAR | Status: AC
  Administered 2018-08-18: 1 g via INTRAMUSCULAR

## 2018-08-18 MED ORDER — ALBUTEROL SULFATE HFA 108 (90 BASE) MCG/ACT IN AERS: 2.0000 | INHALATION_SPRAY | Freq: Four times a day (QID) | RESPIRATORY_TRACT | 2 refills | Status: DC | PRN

## 2018-08-18 NOTE — Progress Notes (Signed)
Subjective:    [name], female    DOB: Apr 07, 1956, 62 y.o.   MRN: [id number]  HPI 4 days of nasal congestion, chest congestion, slight productive cough, sinus pressure and some wheezing. Note also a lot sneezing.  Pt grand mom has been sick.  No fever, no chills or sweats. No myalgias. Pt states had very st initially on Sunday and Monday. But no st presently.  Pt states can't sleep due to cough.  Some wheezing and states in past when gets sick will wheeze.  Review of Systems  Constitutional: Negative for chills, fatigue and fever.  HENT: Positive for congestion, sinus pressure, sinus pain and sore throat. Negative for facial swelling.   Eyes: Negative for pain, redness and itching.  Respiratory: Positive for cough and wheezing. Negative for chest tightness and shortness of breath.        Mild wheeze.  Cardiovascular: Negative for chest pain and palpitations.  Gastrointestinal: Positive for diarrhea. Negative for abdominal distention, abdominal pain and blood in stool.       This morning 5-6 loose stools. This just started.  Genitourinary: Negative for difficulty urinating, dyspareunia, dysuria, frequency and pelvic pain.  Musculoskeletal: Negative for back pain, gait problem and myalgias.  Skin: Negative for rash.  Neurological: Negative for dizziness, speech difficulty, numbness and headaches.  Hematological: Negative for adenopathy. Does not bruise/bleed easily.  Psychiatric/Behavioral: Negative for confusion.    Past Medical History:  Diagnosis Date  . Abnormal SPEP 07/26/2016  . Arthritis    knees, hands  . Back pain 07/14/2016  . Bowel obstruction ([place]) 11/2014  . C. difficile diarrhea   . CHF (congestive heart failure) ([place])   . Congestive heart failure ([place]) 02/24/2015   S/p pacemaker  . Depression   . Elevated sed rate 08/15/2017  . Epigastric pain 03/22/2017  . GERD (gastroesophageal reflux disease)   . Hyperlipidemia   . Hypertension   .  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 Low back pain 07/14/2016  . 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 Multiple myeloma ([place]) 07/20/2018  . Multiple myeloma not having achieved remission ([place]) 07/20/2018  . Myalgia 12/31/2016  . Obstructive sleep apnea 03/31/2015  . SOB (shortness of breath) 04/09/2016  . Stroke [facility])    TIAs     Social History   Socioeconomic History  . Marital status: Single    Spouse name: [name]  . Number of children: [name]  . Years of education: [name]  . Highest education level: [name]  Occupational History  . [name]  Social Needs  . Financial resource strain: [name]  . Food insecurity:    Worry: [name]    Inability: [name]  . Transportation needs:    Medical: [name]    Non-medical: [name]  Tobacco Use  . Smoking status: Never Smoker  . Smokeless tobacco: Never Used  Substance and Sexual Activity  . Alcohol use: No    Alcohol/week: 0.0 standard drinks  . Drug use: No  . Sexual activity: [name]  Lifestyle  . Physical activity:    Days per week: [name]    Minutes per session: [name]  . Stress: [name]  Relationships  . Social connections:    Talks on phone: [name]    Gets together: [name]    Attends religious service: [name]  Active member of club or organization: [name]    Attends meetings of clubs or organizations: [name]    Relationship status: [name]  . Intimate partner violence:    Fear of current or ex partner: [name]    Emotionally abused: [name]    Physically abused: [name]    Forced sexual activity: [name]  Other Topics Concern  . [name]  Social History Narrative  . [name]    Past Surgical History:  Procedure Laterality Date  . ABDOMINAL HYSTERECTOMY     menorraghia, 2006, total  . CHOLECYSTECTOMY    . KNEE SURGERY     right, repair torn torn cartialage  . PACEMAKER  INSERTION  08/2014  . TONSILLECTOMY    . TUBAL LIGATION      Family History  Problem Relation Age of Onset  . Diabetes Mother   . Hypertension Mother   . Heart disease Mother        s/p 1 stent  . Hyperlipidemia Mother   . Arthritis Mother   . Cancer Father        COLON  . Colon cancer Father 39  . Irritable bowel syndrome Sister   . Hyperlipidemia Daughter   . Hypertension Daughter   . Hypertension Maternal Grandmother   . Arthritis Maternal Grandmother   . Heart disease Maternal Grandfather        MI  . Hypertension Maternal Grandfather   . Arthritis Maternal Grandfather   . Hypertension Son     Allergies  Allergen Reactions  . Benazepril Anaphylaxis and Swelling    angioedema Throat and lip swelling  . Codeine Nausea And Vomiting  . Morphine     Redness and hives noted post IV admin on 07/05/17  . Ondansetron Hcl     Redness and hives post IV admin on 07/05/17    Current Outpatient Medications on File Prior to Visit  Medication Sig Dispense Refill  . acetaminophen (TYLENOL) 325 MG tablet Take 650 mg by mouth.    Marland Kitchen albuterol (PROVENTIL HFA;VENTOLIN HFA) 108 (90 Base) MCG/ACT inhaler Inhale 2 puffs into the lungs every 6 (six) hours as needed for wheezing or shortness of breath. 1 Inhaler 1  . amLODipine (NORVASC) 10 MG tablet Take 1 tablet (10 mg total) by mouth daily. 90 tablet 1  . aspirin EC 81 MG tablet Take 1 tablet (81 mg total) by mouth daily. 90 tablet 1  . carvedilol (COREG) 25 MG tablet Take 1 tablet (25 mg total) by mouth 2 (two) times daily with a meal. 180 tablet 1  . cephALEXin (KEFLEX) 500 MG capsule Take 1 capsule (500 mg total) by mouth 2 (two) times daily. 10 capsule 0  . conjugated estrogens (PREMARIN) vaginal cream Place vaginally.    Marland Kitchen dexamethasone (DECADRON) 4 MG tablet Take 5 pills at one time with food ONCE a WEEK!! 100 tablet 2  . dicyclomine (BENTYL) 20 MG tablet Take 1 tablet (20 mg total) by mouth 3 (three) times daily as needed for  spasms. 90 tablet 1  . famciclovir (FAMVIR) 250 MG tablet Take 1 tablet (250 mg total) by mouth daily. 30 tablet 12  . fluticasone (FLONASE) 50 MCG/ACT nasal spray Place 2 sprays into both nostrils daily. 16 g 6  . HYDROcodone-acetaminophen (NORCO/VICODIN) 5-325 MG tablet Take 1 tablet by mouth every 4 (four) hours as needed. 15 tablet 0  . hyoscyamine (LEVSIN SL) 0.125 MG  SL tablet Place 1 tablet (0.125 mg total) under the tongue every 4 (four) hours as needed. 30 tablet 2  . Lactobacillus (ACIDOPHILUS) 100 MG CAPS Take 1 capsule (100 mg total) by mouth daily.  0  . lenalidomide (REVLIMID) 20 MG capsule Take 1 pill daily for 3 weeks on and 1 week off.  Take at bedtime SWFU#[id number] 21 capsule 1  . levocetirizine (XYZAL) 5 MG tablet Take 1 tablet (5 mg total) by mouth every evening. 30 tablet 2  . lidocaine (LIDODERM) 5 % Place 1 patch onto the skin daily. Remove & Discard patch within 12 hours or as directed by MD 30 patch 2  . ondansetron (ZOFRAN) 4 MG tablet Take 1 tablet (4 mg total) by mouth every 8 (eight) hours as needed for nausea or vomiting. 30 tablet 1  . pantoprazole (PROTONIX) 40 MG tablet Take 1 tablet (40 mg total) by mouth 2 (two) times daily. 180 tablet 1  . potassium chloride SA (KLOR-CON M20) 20 MEQ tablet Take 1 tablet (20 mEq total) by mouth daily. 90 tablet 1  . prochlorperazine (COMPAZINE) 10 MG tablet Take 1 tablet (10 mg total) by mouth every 6 (six) hours as needed (Nausea or vomiting). 30 tablet 1  . sucralfate (CARAFATE) 1 g tablet Take 1 tablet (1 g total) by mouth 4 (four) times daily -  with meals and at bedtime. 120 tablet 0  . topiramate (TOPAMAX) 25 MG tablet Take 1 tablet (25 mg total) by mouth 2 (two) times daily. 180 tablet 0   No current facility-administered medications on file prior to visit.     BP 122/73   Pulse 78   Temp 98.1 F (36.7 C) (Oral)   Resp 16   Wt 176 lb 3.2 oz (79.9 kg)   SpO2 94%   BMI 30.24 kg/m       Objective:   Physical  Exam  General  Mental Status - Alert. General Appearance - Well groomed. Not in acute distress.  Skin Rashes- No Rashes.  HEENT Head- Normal. Ear Auditory Canal - Left- Normal. Right - Normal.Tympanic Membrane- Left- Normal. Right- Normal. Eye Sclera/Conjunctiva- Left- Normal. Right- Normal. Nose & Sinuses Nasal Mucosa- Left-  Boggy and Congested. Right-  Boggy and  Congested.Bilateral maxillary and frontal sinus pressure. Mouth & Throat Lips: Upper Lip- Normal: no dryness, cracking, pallor, cyanosis, or vesicular eruption. Lower Lip-Normal: no dryness, cracking, pallor, cyanosis or vesicular eruption. Buccal Mucosa- Bilateral- No Aphthous ulcers. Oropharynx- No Discharge or Erythema. Tonsils: Characteristics- Bilateral- No Erythema or Congestion. Size/Enlargement- Bilateral- No enlargement. Discharge- bilateral-None.  Neck Neck- Supple. No Masses.   Chest and Lung Exam Auscultation: Breath Sounds:-Clear even and unlabored.  Cardiovascular Auscultation:Rythm- Regular, rate and rhythm. Murmurs & Other Heart Sounds:Ausculatation of the heart reveal- No Murmurs.  Lymphatic Head & Neck General Head & Neck Lymphatics: Bilateral: Description- No Localized lymphadenopathy.  Abdomen- soft, nt, nd, +bs,no rebound or guarding. No organomegaly.      Assessment & Plan:  You appear to have bronchitis and sinusitis. Rest hydrate and tylenol for fever. I am prescribing cough medicine hycodan, and azithrmycin antibiotic. For your nasal congestion flonase  You should gradually get better. If not then notify us and would recommend a chest xray.  For wheezing rx albuterol  Both strep test and flu test were negative.  Loose stools today. Advise bland diet and can use immodium. Unfortunate side effect antibiotic is loose stools. If loose stool persist by Monday then need to get stool panel  studies.  Pt request injection  Rocephin antibiotic given after education and advisement. Will  give this today.  Follow up in 7-10 days or as needed

## 2018-08-18 NOTE — Patient Instructions (Addendum)
You appear to have bronchitis and sinusitis. Rest hydrate and tylenol for fever. I am prescribing cough medicine hycodan, and azithrmycin antibiotic. For your nasal congestion flonase  You should gradually get better. If not then notify us and would recommend a chest xray.  For wheezing rx albuterol  Both strep test and flu test were negative.  Loose stools today. Advise bland diet and can use immodium. Unfortunate side effect antibiotic is loose stools. If loose stool persist by Monday then need to get stool panel studies.   Follow up in 7-10 days or as needed

## 2018-08-19 ENCOUNTER — Other Ambulatory Visit: Payer: Self-pay

## 2018-08-19 ENCOUNTER — Inpatient Hospital Stay: Payer: Medicare Other

## 2018-08-19 VITALS — BP 137/77 | HR 88 | Temp 98.2°F

## 2018-08-19 DIAGNOSIS — Z5112 Encounter for antineoplastic immunotherapy: Secondary | ICD-10-CM | POA: Diagnosis not present

## 2018-08-19 DIAGNOSIS — C9000 Multiple myeloma not having achieved remission: Secondary | ICD-10-CM

## 2018-08-19 DIAGNOSIS — Z79899 Other long term (current) drug therapy: Secondary | ICD-10-CM | POA: Diagnosis not present

## 2018-08-19 DIAGNOSIS — E041 Nontoxic single thyroid nodule: Secondary | ICD-10-CM | POA: Diagnosis not present

## 2018-08-19 LAB — CBC WITH DIFFERENTIAL (CANCER CENTER ONLY)
Abs Immature Granulocytes: 0 10*3/uL (ref 0.00–0.07)
BASOS ABS: 0.1 10*3/uL (ref 0.0–0.1)
BASOS PCT: 2 %
Eosinophils Absolute: 0.1 10*3/uL (ref 0.0–0.5)
Eosinophils Relative: 3 %
HCT: 33.5 % — ABNORMAL LOW (ref 36.0–46.0)
Hemoglobin: 10.2 g/dL — ABNORMAL LOW (ref 12.0–15.0)
IMMATURE GRANULOCYTES: 0 %
Lymphocytes Relative: 22 %
Lymphs Abs: 0.7 10*3/uL (ref 0.7–4.0)
MCH: 27.2 pg (ref 26.0–34.0)
MCHC: 30.4 g/dL (ref 30.0–36.0)
MCV: 89.3 fL (ref 80.0–100.0)
Monocytes Absolute: 0.5 10*3/uL (ref 0.1–1.0)
Monocytes Relative: 15 %
NEUTROS ABS: 2 10*3/uL (ref 1.7–7.7)
NEUTROS PCT: 58 %
NRBC: 0 % (ref 0.0–0.2)
PLATELETS: 126 10*3/uL — AB (ref 150–400)
RBC: 3.75 MIL/uL — AB (ref 3.87–5.11)
RDW: 16.1 % — ABNORMAL HIGH (ref 11.5–15.5)
WBC: 3.4 10*3/uL — AB (ref 4.0–10.5)

## 2018-08-19 LAB — CMP (CANCER CENTER ONLY)
ALBUMIN: 2.9 g/dL — AB (ref 3.5–5.0)
ALT: 27 U/L (ref 10–47)
AST: 40 U/L — ABNORMAL HIGH (ref 11–38)
Alkaline Phosphatase: 46 U/L (ref 26–84)
Anion gap: 0 — ABNORMAL LOW (ref 5–15)
BUN: 14 mg/dL (ref 7–22)
CO2: 27 mmol/L (ref 18–33)
Calcium: 8.4 mg/dL (ref 8.0–10.3)
Chloride: 112 mmol/L — ABNORMAL HIGH (ref 98–108)
Creatinine: 0.9 mg/dL (ref 0.60–1.20)
GLUCOSE: 104 mg/dL (ref 73–118)
POTASSIUM: 3.2 mmol/L — AB (ref 3.3–4.7)
SODIUM: 137 mmol/L (ref 128–145)
TOTAL PROTEIN: 8.9 g/dL — AB (ref 6.4–8.1)
Total Bilirubin: 0.5 mg/dL (ref 0.2–1.6)

## 2018-08-19 MED ORDER — BORTEZOMIB CHEMO SQ INJECTION 3.5 MG (2.5MG/ML)
1.3000 mg/m2 | Freq: Once | INTRAMUSCULAR | Status: AC
  Administered 2018-08-19: 2.5 mg via SUBCUTANEOUS
  Filled 2018-08-19: qty 1

## 2018-08-19 MED ORDER — PROCHLORPERAZINE MALEATE 10 MG PO TABS
10.0000 mg | ORAL_TABLET | Freq: Once | ORAL | Status: AC
  Administered 2018-08-19: 10 mg via ORAL

## 2018-08-19 MED ORDER — PROCHLORPERAZINE MALEATE 10 MG PO TABS
ORAL_TABLET | ORAL | Status: AC
  Filled 2018-08-19: qty 1

## 2018-08-19 NOTE — Patient Instructions (Signed)
[facility] Discharge Instructions for Patients Receiving Chemotherapy  Today you received the following chemotherapy agents Velcade To help prevent nausea and vomiting after your treatment, we encourage you to take your nausea medication as prescribed.   If you develop nausea and vomiting that is not controlled by your nausea medication, call the clinic.   BELOW ARE SYMPTOMS THAT SHOULD BE REPORTED IMMEDIATELY:  *FEVER GREATER THAN 100.5 F  *CHILLS WITH OR WITHOUT FEVER  NAUSEA AND VOMITING THAT IS NOT CONTROLLED WITH YOUR NAUSEA MEDICATION  *UNUSUAL SHORTNESS OF BREATH  *UNUSUAL BRUISING OR BLEEDING  TENDERNESS IN MOUTH AND THROAT WITH OR WITHOUT PRESENCE OF ULCERS  *URINARY PROBLEMS  *BOWEL PROBLEMS  UNUSUAL RASH Items with * indicate a potential emergency and should be followed up as soon as possible.  Feel free to call the clinic should you have any questions or concerns. The clinic phone number is [phone].  Please show the CHEMO ALERT CARD at check-in to the Emergency Department and triage nurse.   

## 2018-08-22 DIAGNOSIS — M542 Cervicalgia: Secondary | ICD-10-CM | POA: Diagnosis not present

## 2018-08-22 DIAGNOSIS — Z79899 Other long term (current) drug therapy: Secondary | ICD-10-CM | POA: Diagnosis not present

## 2018-08-22 DIAGNOSIS — J40 Bronchitis, not specified as acute or chronic: Secondary | ICD-10-CM | POA: Diagnosis not present

## 2018-08-22 DIAGNOSIS — J209 Acute bronchitis, unspecified: Secondary | ICD-10-CM | POA: Diagnosis not present

## 2018-08-23 ENCOUNTER — Other Ambulatory Visit: Payer: Self-pay | Admitting: Hematology & Oncology

## 2018-08-24 DIAGNOSIS — G4733 Obstructive sleep apnea (adult) (pediatric): Secondary | ICD-10-CM | POA: Diagnosis not present

## 2018-08-24 DIAGNOSIS — K219 Gastro-esophageal reflux disease without esophagitis: Secondary | ICD-10-CM | POA: Diagnosis not present

## 2018-08-24 DIAGNOSIS — I10 Essential (primary) hypertension: Secondary | ICD-10-CM | POA: Diagnosis not present

## 2018-08-24 DIAGNOSIS — I420 Dilated cardiomyopathy: Secondary | ICD-10-CM | POA: Diagnosis not present

## 2018-08-24 DIAGNOSIS — I447 Left bundle-branch block, unspecified: Secondary | ICD-10-CM | POA: Diagnosis not present

## 2018-08-25 ENCOUNTER — Other Ambulatory Visit: Payer: Self-pay | Admitting: Hematology

## 2018-08-25 NOTE — Progress Notes (Signed)
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  Current Therapy:         1st line: q28day RVD, started on 08/05/2018  ASSESSMENT & PLAN:  IgG kappa multiple myeloma -Currently on Cycle 2 of RVD; next Velcade injection on 09/02/2018 -Overall, patient is tolerating treatment well with no significant side effects except grade 1 maculopapular rash (see management below) -No indication for dose reduction of Revlimid at this time -I discussed with the patient that if she developed worsening rash, especially skin desquamation, she needs to contact oncology clinic ASAP  Maculopapular rash -Grade 1 over the anterior chest -I have prescribed high potency fluticasone cream twice daily  Bone disease prevention -Staging PET in September 2019 did not show any definitive bony lesions related to multiple myeloma -At the last visit, Dr. Marin Olp discussed denosumab for prevention of skeletal related events -Prior to starting denosumab with bisphosphonate, I will refer the patient to dentistry to assess dental health  Left thyroid hypermetabolic nodule -PET in September 2019 showed a hypermetabolic left thyroid nodule measuring 1.1cm (SUV 48.3) -Order thyroid US to further assess the nodule; based the findings, we will determine if thyroid biopsy is indicated  Orders Placed This Encounter  Procedures  . US Soft Tissue Head/Neck    Standing Status:   Future    Standing Expiration Date:   08/26/2019    Order Specific Question:   Reason for Exam (SYMPTOM  OR DIAGNOSIS REQUIRED)    Answer:   Hypermetabolic left thyroid nodule on PET    Order Specific Question:   Preferred imaging location?    Answer:   [facility]  . Ambulatory referral to Dentistry    Referral Priority:   Routine    Referral Type:   Consultation    Referral Reason:    Specialty Services Required    Requested Specialty:   Dental General Practice    Number of Visits Requested:   1   All questions were answered. The patient knows to call the clinic with any problems, questions or concerns. No barriers to learning was detected.  A total of more than 25 minutes were spent face-to-face with the patient during this encounter and over half of that time was spent on counseling and coordination of care as outlined above.   Return to clinic on 09/02/2018 for labs, infusion, and follow-up of the skin rash.  Tish Men, MD 08/26/2018 9:59 AM  CHIEF COMPLAINT: "I am here for my rash"  INTERVAL HISTORY: [name] comes to clinic today for evaluation of new onset rash over the chest and breasts.  She reports that for the past week and a half, she is developed new onset raised, pruritic, rash over the chest and breasts.  She has multiple allergies and has been working outdoor, so she initially thought that the rash was due to exposure to environmental allergens.  Howeve.r her rash continued to persist, so she contacted the oncology clinic for further evaluation.  She denies any skin desquamation or peeling of oral mucosa, recent medication changes, or changing in soap/detergent.  She tried over-the-counter hydrocortisone cream with modest relief of the itching, but the rash has not resolved.  She denies any worsening rash.   SUMMARY OF ONCOLOGIC HISTORY:   Multiple myeloma not having achieved remission ([place])   07/20/2018 Initial Diagnosis    Multiple myeloma  not having achieved remission ([place])    08/05/2018 -  Chemotherapy    The patient had bortezomib SQ (VELCADE) chemo injection 2.5 mg, 1.3 mg/m2 = 2.5 mg, Subcutaneous,  Once, 3 of 9 cycles Administration: 2.5 mg (08/05/2018), 2.5 mg (08/12/2018), 2.5 mg (08/19/2018)  for chemotherapy treatment.      REVIEW OF SYSTEMS:   Constitutional: ( - ) fevers, ( - )  chills , ( - ) night sweats Eyes: ( - ) blurriness of vision,  ( - ) double vision, ( - ) watery eyes Ears, nose, mouth, throat, and face: ( - ) mucositis, ( - ) sore throat Respiratory: ( - ) cough, ( - ) dyspnea, ( - ) wheezes Cardiovascular: ( - ) palpitation, ( - ) chest discomfort, ( - ) lower extremity swelling Gastrointestinal:  ( - ) nausea, ( - ) heartburn, ( - ) change in bowel habits Skin: ( - ) abnormal skin rashes Lymphatics: ( - ) new lymphadenopathy, ( - ) easy bruising Neurological: ( - ) numbness, ( - ) tingling, ( - ) new weaknesses Behavioral/Psych: ( - ) mood change, ( - ) new changes  All other systems were reviewed with the patient and are negative.  I have reviewed the past medical history, past surgical history, social history and family history with the patient and they are unchanged from previous note.  ALLERGIES:  is allergic to benazepril; codeine; morphine; and ondansetron hcl.  MEDICATIONS:  Current Outpatient Medications  Medication Sig Dispense Refill  . acetaminophen (TYLENOL) 325 MG tablet Take 650 mg by mouth.    Marland Kitchen albuterol (PROVENTIL HFA;VENTOLIN HFA) 108 (90 Base) MCG/ACT inhaler Inhale 2 puffs into the lungs every 6 (six) hours as needed for wheezing or shortness of breath. 1 Inhaler 1  . albuterol (PROVENTIL HFA;VENTOLIN HFA) 108 (90 Base) MCG/ACT inhaler Inhale 2 puffs into the lungs every 6 (six) hours as needed for wheezing or shortness of breath. 1 Inhaler 2  . amLODipine (NORVASC) 10 MG tablet Take 1 tablet (10 mg total) by mouth daily. 90 tablet 1  . aspirin EC 81 MG tablet Take 1 tablet (81 mg total) by mouth daily. 90 tablet 1  . azithromycin (ZITHROMAX) 250 MG tablet Take 2 tablets by mouth on day 1, followed by 1 tablet by mouth daily for 4 days. 6 tablet 0  . carvedilol (COREG) 25 MG tablet Take 1 tablet (25 mg total) by mouth 2 (two) times daily with a meal. 180 tablet 1  . cephALEXin (KEFLEX) 500 MG capsule Take 1 capsule (500 mg total) by mouth 2 (two) times daily. 10 capsule 0  . conjugated  estrogens (PREMARIN) vaginal cream Place vaginally.    . cyclobenzaprine (FLEXERIL) 10 MG tablet Take by mouth.    . dexamethasone (DECADRON) 4 MG tablet Take 5 pills at one time with food ONCE a WEEK!! 100 tablet 2  . dicyclomine (BENTYL) 20 MG tablet Take 1 tablet (20 mg total) by mouth 3 (three) times daily as needed for spasms. 90 tablet 1  . famciclovir (FAMVIR) 250 MG tablet Take 1 tablet (250 mg total) by mouth daily. 30 tablet 12  . fluticasone (CUTIVATE) 0.05 % cream Apply topically 2 (two) times daily. 30 g 5  . fluticasone (FLONASE) 50 MCG/ACT nasal spray Place 2 sprays into both nostrils daily. 16 g 6  . fluticasone (FLONASE) 50 MCG/ACT nasal spray Place 2 sprays into both nostrils daily. 16 g 1  . HYDROcodone-acetaminophen (NORCO/VICODIN) 5-325 MG tablet  Take 1 tablet by mouth every 4 (four) hours as needed. 15 tablet 0  . HYDROcodone-homatropine (HYCODAN) 5-1.5 MG/5ML syrup Take 5 mLs by mouth every 6 (six) hours as needed for cough. 100 mL 0  . hyoscyamine (LEVSIN SL) 0.125 MG SL tablet Place 1 tablet (0.125 mg total) under the tongue every 4 (four) hours as needed. 30 tablet 2  . Lactobacillus (ACIDOPHILUS) 100 MG CAPS Take 1 capsule (100 mg total) by mouth daily.  0  . lenalidomide (REVLIMID) 20 MG capsule Take 1 pill daily for 3 weeks on and 1 week off.  Take at bedtime [id number]#[id number] 21 capsule 1  . levocetirizine (XYZAL) 5 MG tablet Take 1 tablet (5 mg total) by mouth every evening. 30 tablet 2  . lidocaine (LIDODERM) 5 % Place 1 patch onto the skin daily. Remove & Discard patch within 12 hours or as directed by MD 30 patch 2  . ondansetron (ZOFRAN) 4 MG tablet Take 1 tablet (4 mg total) by mouth every 8 (eight) hours as needed for nausea or vomiting. 30 tablet 1  . pantoprazole (PROTONIX) 40 MG tablet Take 1 tablet (40 mg total) by mouth 2 (two) times daily. 180 tablet 1  . potassium chloride SA (KLOR-CON M20) 20 MEQ tablet Take 1 tablet (20 mEq total) by mouth daily. 90 tablet 1   . prochlorperazine (COMPAZINE) 10 MG tablet Take 1 tablet (10 mg total) by mouth every 6 (six) hours as needed (Nausea or vomiting). 30 tablet 1  . ranitidine (ZANTAC) 75 MG/5ML syrup Take by mouth.    . sucralfate (CARAFATE) 1 g tablet Take 1 tablet (1 g total) by mouth 4 (four) times daily -  with meals and at bedtime. 120 tablet 0  . topiramate (TOPAMAX) 25 MG tablet Take 1 tablet (25 mg total) by mouth 2 (two) times daily. 180 tablet 0   No current facility-administered medications for this visit.     PHYSICAL EXAMINATION: ECOG PERFORMANCE STATUS: 1 - Symptomatic but completely ambulatory  Today's Vitals   08/26/18 0906  BP: (!) 152/78  Pulse: 95  Temp: 97.6 F (36.4 C)  TempSrc: Oral  SpO2: 100%  Weight: 175 lb (79.4 kg)  Height: '5\' 7"'$  (1.702 m)  PainSc: 0-No pain   Body mass index is 27.41 kg/m.  Filed Weights   08/26/18 0906  Weight: 175 lb (79.4 kg)    GENERAL: alert, no distress and comfortable SKIN: scattered raised small papules over the chest and bilateral breasts, a small confluent patch of erythematous rash over the sternum; no other skin lesion noted  EYES: conjunctiva are pink and non-injected, sclera clear OROPHARYNX: no exudate, no erythema; lips, buccal mucosa, and tongue normal; no mucosal peeling NECK: supple, non-tender LYMPH:  no palpable lymphadenopathy in the cervical or axillary  LUNGS: clear to auscultation and percussion with normal breathing effort HEART: regular rate & rhythm and no murmurs and no lower extremity edema ABDOMEN: soft, non-tender, non-distended, normal bowel sounds Musculoskeletal: no cyanosis of digits and no clubbing  PSYCH: alert & oriented x 3, fluent speech NEURO: no focal motor/sensory deficits  LABORATORY DATA:  I have reviewed the data as listed    Component Value Date/Time   NA 137 08/19/2018 0852   NA 143 11/08/2017 1127   NA 140 12/31/2016 1000   K 3.2 (L) 08/19/2018 0852   K 4.0 11/08/2017 1127   K 3.6  12/31/2016 1000   CL 112 (H) 08/19/2018 0852   CL 107 11/08/2017 1127  CO2 27 08/19/2018 0852   CO2 25 11/08/2017 1127   CO2 24 12/31/2016 1000   GLUCOSE 104 08/19/2018 0852   GLUCOSE 96 11/08/2017 1127   BUN 14 08/19/2018 0852   BUN 19 11/08/2017 1127   BUN 22.3 12/31/2016 1000   CREATININE 0.90 08/19/2018 0852   CREATININE 0.9 11/08/2017 1127   CREATININE 0.8 12/31/2016 1000   CALCIUM 8.4 08/19/2018 0852   CALCIUM 9.9 11/08/2017 1127   CALCIUM 9.5 12/31/2016 1000   PROT 8.9 (H) 08/19/2018 0852   PROT 9.4 (H) 11/08/2017 1127   PROT 10.3 (H) 11/08/2017 1127   PROT 9.5 (H) 12/31/2016 1000   ALBUMIN 2.9 (L) 08/19/2018 0852   ALBUMIN 3.2 (L) 11/08/2017 1127   ALBUMIN 3.7 12/31/2016 1000   AST 40 (H) 08/19/2018 0852   AST 28 12/31/2016 1000   ALT 27 08/19/2018 0852   ALT 36 11/08/2017 1127   ALT 16 12/31/2016 1000   ALKPHOS 46 08/19/2018 0852   ALKPHOS 44 11/08/2017 1127   ALKPHOS 51 12/31/2016 1000   BILITOT 0.5 08/19/2018 0852   BILITOT 0.46 12/31/2016 1000   GFRNONAA >60 07/06/2018 1209   GFRNONAA >60 07/01/2018 0857   GFRNONAA 82 09/18/2016 1152   GFRAA >60 07/06/2018 1209   GFRAA >60 07/01/2018 0857   GFRAA >89 09/18/2016 1152    Lab Results  Component Value Date   SPEP . 08/05/2018    Lab Results  Component Value Date   WBC 3.4 (L) 08/19/2018   NEUTROABS 2.0 08/19/2018   HGB 10.2 (L) 08/19/2018   HCT 33.5 (L) 08/19/2018   MCV 89.3 08/19/2018   PLT 126 (L) 08/19/2018      Chemistry      Component Value Date/Time   NA 137 08/19/2018 0852   NA 143 11/08/2017 1127   NA 140 12/31/2016 1000   K 3.2 (L) 08/19/2018 0852   K 4.0 11/08/2017 1127   K 3.6 12/31/2016 1000   CL 112 (H) 08/19/2018 0852   CL 107 11/08/2017 1127   CO2 27 08/19/2018 0852   CO2 25 11/08/2017 1127   CO2 24 12/31/2016 1000   BUN 14 08/19/2018 0852   BUN 19 11/08/2017 1127   BUN 22.3 12/31/2016 1000   CREATININE 0.90 08/19/2018 0852   CREATININE 0.9 11/08/2017 1127    CREATININE 0.8 12/31/2016 1000      Component Value Date/Time   CALCIUM 8.4 08/19/2018 0852   CALCIUM 9.9 11/08/2017 1127   CALCIUM 9.5 12/31/2016 1000   ALKPHOS 46 08/19/2018 0852   ALKPHOS 44 11/08/2017 1127   ALKPHOS 51 12/31/2016 1000   AST 40 (H) 08/19/2018 0852   AST 28 12/31/2016 1000   ALT 27 08/19/2018 0852   ALT 36 11/08/2017 1127   ALT 16 12/31/2016 1000   BILITOT 0.5 08/19/2018 0852   BILITOT 0.46 12/31/2016 1000       RADIOGRAPHIC STUDIES: I have personally reviewed the radiological images as listed below and agreed with the findings in the report. Nm Pet Image Initial (pi) Skull Base To Thigh  Result Date: 08/01/2018 CLINICAL DATA:  Initial treatment strategy for multiple myeloma. Initial workup period. EXAM: NUCLEAR MEDICINE PET SKULL BASE TO THIGH TECHNIQUE: 8.7 mCi F-18 FDG was injected intravenously. Full-ring PET imaging was performed from the skull base to thigh after the radiotracer. CT data was obtained and used for attenuation correction and anatomic localization. Fasting blood glucose: 97 mg/dl COMPARISON:  Abdominopelvic CT 06/24/2018. Chest radiograph 05/07/2018. Chest CT 02/07/2018. FINDINGS:  Mediastinal blood pool activity: SUV max 2.5 NECK: Hypermetabolism corresponding to an anterior left thyroid nodule. This measures 11 mm and a S.U.V. max of 48.3, including on image 29/4. No cervical nodal hypermetabolism. Incidental CT findings: No cervical adenopathy. Right carotid atherosclerosis. CHEST: Low-level hypermetabolism involving bilateral axillary nodes. Example at up to 9 mm and a S.U.V. max of 3.3 in the left axilla. No mediastinal nodal hypermetabolism. Incidental CT findings: Pacer and right-sided PICC line. Cardiomegaly. ABDOMEN/PELVIS: A left inguinal node measures 1.0 cm and a S.U.V. max of 3.7 on image 161/4. No abdominopelvic parenchymal hypermetabolism. Incidental CT findings: Right hepatic lobe cyst. Interpolar right renal low-density lesion is likely a  cyst at 1.7 cm. Retroaortic left renal vein. Cholecystectomy. Pelvic floor laxity. Hysterectomy. SKELETON: Low-level hypermetabolism within the right-side of the sternal body. This measures a S.U.V. max of 4.4, including on approximately image 66/4. No well-defined CT correlate. Incidental CT findings: No focal lytic lesions. Pectus excavatum deformity. IMPRESSION: 1. Low-level hypermetabolism corresponding to upper normal sized axillary and left inguinal nodes, indeterminate. 2. Isolated low-level hypermetabolism within the right side of the sternum. Otherwise, no suspicious osseous activity identified. 3. Hypermetabolic left thyroid nodule. Especially given the extent of hypermetabolism, this is suspicious for a malignant lesion and should be further characterized with dedicated ultrasound. Electronically Signed   By: Abigail Miyamoto M.D.   On: 08/01/2018 10:02   Ct Biopsy  Result Date: 07/28/2018 INDICATION: 62 year old female with a history of multiple myeloma EXAM: CT BONE MARROW BIOPSY AND ASPIRATION; CT BIOPSY MEDICATIONS: None. ANESTHESIA/SEDATION: Moderate (conscious) sedation was employed during this procedure. A total of Versed 3.0 mg and Fentanyl 100 mcg was administered intravenously. Moderate Sedation Time: 10 minutes. The patient's level of consciousness and vital signs were monitored continuously by radiology nursing throughout the procedure under my direct supervision. FLUOROSCOPY TIME:  CT COMPLICATIONS: None PROCEDURE: The procedure risks, benefits, and alternatives were explained to the patient. Questions regarding the procedure were encouraged and answered. The patient understands and consents to the procedure. Scout CT of the pelvis was performed for surgical planning purposes. The posterior pelvis was prepped with Chlorhexidine in a sterile fashion, and a sterile drape was applied covering the operative field. A sterile gown and sterile gloves were used for the procedure. Local anesthesia was  provided with 1% Lidocaine. Posterior iliac bone was targeted for biopsy. The skin and subcutaneous tissues were infiltrated with 1% lidocaine without epinephrine. A small stab incision was made with an 11 blade scalpel, and an 11 gauge Murphy needle was advanced with CT guidance to the posterior cortex. Manual forced was used to advance the needle through the posterior cortex and the stylet was removed. A bone marrow aspirate was retrieved and passed to a cytotechnologist in the room. The Murphy needle was then advanced without the stylet for a core biopsy. The core biopsy was retrieved and also passed to a cytotechnologist. Manual pressure was used for hemostasis and a sterile dressing was placed. No complications were encountered no significant blood loss was encountered. Patient tolerated the procedure well and remained hemodynamically stable throughout. IMPRESSION: Status post CT-guided bone marrow biopsy, with tissue specimen sent to pathology for complete histopathologic analysis Signed, Dulcy Fanny. Earleen Newport, DO [facility] Radiology Electronically Signed   By: Corrie Mckusick D.O.   On: 07/28/2018 10:38   Ct Bone Marrow Biopsy & Aspiration  Result Date: 07/28/2018 INDICATION: 62 year old female with a history of multiple myeloma EXAM: CT BONE MARROW BIOPSY AND  ASPIRATION; CT BIOPSY MEDICATIONS: None. ANESTHESIA/SEDATION: Moderate (conscious) sedation was employed during this procedure. A total of Versed 3.0 mg and Fentanyl 100 mcg was administered intravenously. Moderate Sedation Time: 10 minutes. The patient's level of consciousness and vital signs were monitored continuously by radiology nursing throughout the procedure under my direct supervision. FLUOROSCOPY TIME:  CT COMPLICATIONS: None PROCEDURE: The procedure risks, benefits, and alternatives were explained to the patient. Questions regarding the procedure were encouraged and answered. The patient  understands and consents to the procedure. Scout CT of the pelvis was performed for surgical planning purposes. The posterior pelvis was prepped with Chlorhexidine in a sterile fashion, and a sterile drape was applied covering the operative field. A sterile gown and sterile gloves were used for the procedure. Local anesthesia was provided with 1% Lidocaine. Posterior iliac bone was targeted for biopsy. The skin and subcutaneous tissues were infiltrated with 1% lidocaine without epinephrine. A small stab incision was made with an 11 blade scalpel, and an 11 gauge Murphy needle was advanced with CT guidance to the posterior cortex. Manual forced was used to advance the needle through the posterior cortex and the stylet was removed. A bone marrow aspirate was retrieved and passed to a cytotechnologist in the room. The Murphy needle was then advanced without the stylet for a core biopsy. The core biopsy was retrieved and also passed to a cytotechnologist. Manual pressure was used for hemostasis and a sterile dressing was placed. No complications were encountered no significant blood loss was encountered. Patient tolerated the procedure well and remained hemodynamically stable throughout. IMPRESSION: Status post CT-guided bone marrow biopsy, with tissue specimen sent to pathology for complete histopathologic analysis Signed, Dulcy Fanny. Earleen Newport, DO [facility] Radiology Electronically Signed   By: Corrie Mckusick D.O.   On: 07/28/2018 10:38

## 2018-08-26 ENCOUNTER — Inpatient Hospital Stay (HOSPITAL_BASED_OUTPATIENT_CLINIC_OR_DEPARTMENT_OTHER): Payer: Medicare Other | Admitting: Hematology

## 2018-08-26 ENCOUNTER — Encounter: Payer: Self-pay | Admitting: Hematology

## 2018-08-26 VITALS — BP 152/78 | HR 95 | Temp 97.6°F | Ht 67.0 in | Wt 175.0 lb

## 2018-08-26 DIAGNOSIS — E041 Nontoxic single thyroid nodule: Secondary | ICD-10-CM | POA: Diagnosis not present

## 2018-08-26 DIAGNOSIS — Z79899 Other long term (current) drug therapy: Secondary | ICD-10-CM | POA: Diagnosis not present

## 2018-08-26 DIAGNOSIS — T451X5D Adverse effect of antineoplastic and immunosuppressive drugs, subsequent encounter: Secondary | ICD-10-CM | POA: Diagnosis not present

## 2018-08-26 DIAGNOSIS — C9000 Multiple myeloma not having achieved remission: Secondary | ICD-10-CM | POA: Diagnosis not present

## 2018-08-26 DIAGNOSIS — L270 Generalized skin eruption due to drugs and medicaments taken internally: Secondary | ICD-10-CM | POA: Insufficient documentation

## 2018-08-26 DIAGNOSIS — B86 Scabies: Secondary | ICD-10-CM | POA: Diagnosis not present

## 2018-08-26 DIAGNOSIS — R21 Rash and other nonspecific skin eruption: Secondary | ICD-10-CM | POA: Diagnosis not present

## 2018-08-26 DIAGNOSIS — Z5112 Encounter for antineoplastic immunotherapy: Secondary | ICD-10-CM | POA: Diagnosis not present

## 2018-08-26 MED ORDER — FLUTICASONE PROPIONATE 0.05 % EX CREA: TOPICAL_CREAM | Freq: Two times a day (BID) | CUTANEOUS | 5 refills | Status: AC

## 2018-08-29 ENCOUNTER — Other Ambulatory Visit: Payer: Self-pay | Admitting: Hematology

## 2018-08-30 ENCOUNTER — Ambulatory Visit (HOSPITAL_COMMUNITY): Payer: Medicare Other | Admitting: Dentistry

## 2018-08-30 ENCOUNTER — Other Ambulatory Visit: Payer: Self-pay | Admitting: *Deleted

## 2018-08-30 ENCOUNTER — Encounter (HOSPITAL_COMMUNITY): Payer: Self-pay | Admitting: Dentistry

## 2018-08-30 VITALS — BP 138/74 | HR 67 | Temp 98.1°F

## 2018-08-30 DIAGNOSIS — M2632 Excessive spacing of fully erupted teeth: Secondary | ICD-10-CM

## 2018-08-30 DIAGNOSIS — K036 Deposits [accretions] on teeth: Secondary | ICD-10-CM

## 2018-08-30 DIAGNOSIS — K08409 Partial loss of teeth, unspecified cause, unspecified class: Secondary | ICD-10-CM

## 2018-08-30 DIAGNOSIS — Z01818 Encounter for other preprocedural examination: Secondary | ICD-10-CM | POA: Diagnosis not present

## 2018-08-30 DIAGNOSIS — K0530 Chronic periodontitis, unspecified: Secondary | ICD-10-CM

## 2018-08-30 DIAGNOSIS — C9000 Multiple myeloma not having achieved remission: Secondary | ICD-10-CM

## 2018-08-30 DIAGNOSIS — Z9189 Other specified personal risk factors, not elsewhere classified: Secondary | ICD-10-CM

## 2018-08-30 DIAGNOSIS — K06010 Localized gingival recession, unspecified: Secondary | ICD-10-CM

## 2018-08-30 DIAGNOSIS — K0889 Other specified disorders of teeth and supporting structures: Secondary | ICD-10-CM

## 2018-08-30 DIAGNOSIS — K029 Dental caries, unspecified: Secondary | ICD-10-CM

## 2018-08-30 DIAGNOSIS — M264 Malocclusion, unspecified: Secondary | ICD-10-CM

## 2018-08-30 DIAGNOSIS — K055 Other periodontal diseases: Secondary | ICD-10-CM

## 2018-08-30 MED ORDER — LENALIDOMIDE 20 MG PO CAPS: ORAL_CAPSULE | ORAL | 1 refills | Status: DC

## 2018-08-30 NOTE — Progress Notes (Signed)
DENTAL CONSULTATION  Date of Consultation:  08/30/2018 Patient Name:   Tami [name] Date of Birth:   13-Aug-1956 Medical Record Number: [id number]  VITALS: BP 138/74 (BP Location: Right Arm)   Pulse 67   Temp 98.1 F (36.7 C)   CHIEF COMPLAINT: Patient referred by Dr. Maylon Peppers for dental consultation.  HPI: Tami [name] It is a 62 year old female recently diagnosed with multiple myeloma. Patient with anticipated use of Xgeva therapy. Patient is now seen as part of a medically necessary pre-Xgeva therapy dental protocol examination.  The patient currently denies acute toothaches, swellings, or abscesses. Patient was last seen by Dr. Kathaleen Bury in [place], [place] for insertion of a maxillary partial denture in September 2018. The patient's last dental cleaning was in 2017.The patient denies having a lower partial denture. Patient denies having dental phobia.  PROBLEM LIST: Patient Active Problem List   Diagnosis Date Noted  . Multiple myeloma ([place]) 07/20/2018    Priority: High  . Thyroid nodule 08/26/2018  . Drug rash 08/26/2018  . Multiple myeloma not having achieved remission ([place]) 07/20/2018  . Hematuria 07/11/2018  . Seasonal allergies 06/23/2018  . Right shoulder pain 06/07/2018  . Neck pain 06/07/2018  . Paresthesias 06/07/2018  . 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 Pain in thoracic spine 03/18/2018  . Thoracic back pain 02/15/2018  . 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 Pneumonia 11/11/2017  . Cystitis 10/25/2017  . C. difficile colitis 09/15/2017  . Hypokalemia 08/20/2017  . Anemia 08/20/2017  . Elevated sed rate 08/15/2017  . 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 Epigastric pain 03/22/2017  . Myalgia 12/31/2016  . 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 SOB (shortness of breath) 07/26/2016  . Abnormal SPEP 07/26/2016  . Low back pain 07/14/2016  . Insomnia 12/29/2015  . Tension  headache 10/21/2015  . Muscle spasms of neck 10/18/2015  . Dysuria 10/13/2015  . Sinusitis 07/07/2015  . AP (abdominal pain) 07/07/2015  . Cardiomyopathy ([place]) 04/19/2015  . Chest wall pain 04/19/2015  . Obstructive sleep apnea 03/31/2015  . Congestive heart failure ([place]) 02/24/2015  . Hyperlipidemia   . Hypertension   . Depression   . Stroke ([place])   . GERD (gastroesophageal reflux disease)   . Bowel obstruction ([place]) 11/09/2014    PMH: Past Medical History:  Diagnosis Date  . Abnormal SPEP 07/26/2016  . Arthritis    knees, hands  . Back pain 07/14/2016  . Bowel obstruction ([place]) 11/2014  . C. difficile diarrhea   . CHF (congestive heart failure) ([place])   . Congestive heart failure ([place]) 02/24/2015   S/p pacemaker  . Depression   . Elevated sed rate 08/15/2017  . Epigastric pain 03/22/2017  . GERD (gastroesophageal reflux disease)   . Hyperlipidemia   . Hypertension   . 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 Low back pain 07/14/2016  . 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 Multiple myeloma ([place]) 07/20/2018  . Multiple myeloma not having achieved remission ([place]) 07/20/2018  . Myalgia 12/31/2016  . Obstructive sleep apnea 03/31/2015  . SOB (shortness of breath) 04/09/2016  . Stroke [facility])    TIAs    PSH: Past Surgical History:  Procedure Laterality Date  . ABDOMINAL HYSTERECTOMY     menorraghia, 2006, total  . CHOLECYSTECTOMY    . KNEE SURGERY     right, repair torn torn cartialage  . PACEMAKER INSERTION  08/2014  . TONSILLECTOMY    . TUBAL  LIGATION      ALLERGIES: Allergies  Allergen Reactions  . Benazepril Anaphylaxis and Swelling    angioedema Throat and lip swelling  . Codeine Nausea And Vomiting  . Morphine     Redness and hives noted post IV admin on 07/05/17  . Ondansetron Hcl     Redness and hives post IV admin on 07/05/17    MEDICATIONS: Current Outpatient Medications  Medication Sig Dispense Refill  . acetaminophen  (TYLENOL) 325 MG tablet Take 650 mg by mouth.    Marland Kitchen albuterol (PROVENTIL HFA;VENTOLIN HFA) 108 (90 Base) MCG/ACT inhaler Inhale 2 puffs into the lungs every 6 (six) hours as needed for wheezing or shortness of breath. 1 Inhaler 2  . amLODipine (NORVASC) 10 MG tablet Take 1 tablet (10 mg total) by mouth daily. 90 tablet 1  . aspirin EC 81 MG tablet Take 1 tablet (81 mg total) by mouth daily. 90 tablet 1  . carvedilol (COREG) 25 MG tablet Take 1 tablet (25 mg total) by mouth 2 (two) times daily with a meal. 180 tablet 1  . conjugated estrogens (PREMARIN) vaginal cream Place vaginally.    . cyclobenzaprine (FLEXERIL) 10 MG tablet Take by mouth.    . dexamethasone (DECADRON) 4 MG tablet Take 5 pills at one time with food ONCE a WEEK!! 100 tablet 2  . dicyclomine (BENTYL) 20 MG tablet Take 1 tablet (20 mg total) by mouth 3 (three) times daily as needed for spasms. 90 tablet 1  . famciclovir (FAMVIR) 250 MG tablet Take 1 tablet (250 mg total) by mouth daily. 30 tablet 12  . fluticasone (CUTIVATE) 0.05 % cream Apply topically 2 (two) times daily. 30 g 5  . fluticasone (FLONASE) 50 MCG/ACT nasal spray Place 2 sprays into both nostrils daily. 16 g 1  . HYDROcodone-acetaminophen (NORCO/VICODIN) 5-325 MG tablet Take 1 tablet by mouth every 4 (four) hours as needed. 15 tablet 0  . hyoscyamine (LEVSIN SL) 0.125 MG SL tablet Place 1 tablet (0.125 mg total) under the tongue every 4 (four) hours as needed. 30 tablet 2  . lenalidomide (REVLIMID) 20 MG capsule Take 1 pill daily for 3 weeks on and 1 week off.  Take at bedtime BTDV#[id number] 21 capsule 1  . levocetirizine (XYZAL) 5 MG tablet Take 1 tablet (5 mg total) by mouth every evening. 30 tablet 2  . ondansetron (ZOFRAN) 4 MG tablet Take 1 tablet (4 mg total) by mouth every 8 (eight) hours as needed for nausea or vomiting. 30 tablet 1  . pantoprazole (PROTONIX) 40 MG tablet Take 1 tablet (40 mg total) by mouth 2 (two) times daily. 180 tablet 1  . potassium chloride  SA (KLOR-CON M20) 20 MEQ tablet Take 1 tablet (20 mEq total) by mouth daily. 90 tablet 1  . prochlorperazine (COMPAZINE) 10 MG tablet Take 1 tablet (10 mg total) by mouth every 6 (six) hours as needed (Nausea or vomiting). 30 tablet 1  . ranitidine (ZANTAC) 75 MG/5ML syrup Take by mouth.    . sucralfate (CARAFATE) 1 g tablet Take 1 tablet (1 g total) by mouth 4 (four) times daily -  with meals and at bedtime. 120 tablet 0  . topiramate (TOPAMAX) 25 MG tablet Take 1 tablet (25 mg total) by mouth 2 (two) times daily. 180 tablet 0  . lidocaine (LIDODERM) 5 % Place 1 patch onto the skin daily. Remove & Discard patch within 12 hours or as directed by MD (Patient not taking: Reported on 08/30/2018) 30 patch 2  No current facility-administered medications for this visit.     LABS: Lab Results  Component Value Date   WBC 3.4 (L) 08/19/2018   HGB 10.2 (L) 08/19/2018   HCT 33.5 (L) 08/19/2018   MCV 89.3 08/19/2018   PLT 126 (L) 08/19/2018      Component Value Date/Time   NA 137 08/19/2018 0852   NA 143 11/08/2017 1127   NA 140 12/31/2016 1000   K 3.2 (L) 08/19/2018 0852   K 4.0 11/08/2017 1127   K 3.6 12/31/2016 1000   CL 112 (H) 08/19/2018 0852   CL 107 11/08/2017 1127   CO2 27 08/19/2018 0852   CO2 25 11/08/2017 1127   CO2 24 12/31/2016 1000   GLUCOSE 104 08/19/2018 0852   GLUCOSE 96 11/08/2017 1127   BUN 14 08/19/2018 0852   BUN 19 11/08/2017 1127   BUN 22.3 12/31/2016 1000   CREATININE 0.90 08/19/2018 0852   CREATININE 0.9 11/08/2017 1127   CREATININE 0.8 12/31/2016 1000   CALCIUM 8.4 08/19/2018 0852   CALCIUM 9.9 11/08/2017 1127   CALCIUM 9.5 12/31/2016 1000   GFRNONAA >60 07/06/2018 1209   GFRNONAA >60 07/01/2018 0857   GFRNONAA 82 09/18/2016 1152   GFRAA >60 07/06/2018 1209   GFRAA >60 07/01/2018 0857   GFRAA >89 09/18/2016 1152   Lab Results  Component Value Date   INR 0.90 07/28/2018   INR 0.99 11/17/2017   No results found for: PTT  SOCIAL HISTORY: Social  History   Socioeconomic History  . Marital status: Divorced    Spouse name: [name]  . Number of children: 3  . Years of education: [name]  . Highest education level: [name]  Occupational History  . [name]  Social Needs  . Financial resource strain: [name]  . Food insecurity:    Worry: [name]    Inability: [name]  . Transportation needs:    Medical: [name]    Non-medical: [name]  Tobacco Use  . Smoking status: Never Smoker  . Smokeless tobacco: Never Used  Substance and Sexual Activity  . Alcohol use: No    Alcohol/week: 0.0 standard drinks  . Drug use: No  . Sexual activity: [name]  Lifestyle  . Physical activity:    Days per week: [name]    Minutes per session: [name]  . Stress: [name]  Relationships  . Social connections:    Talks on phone: [name]    Gets together: [name]    Attends religious service: [name]    Active member of club or organization: [name]    Attends meetings of clubs or organizations: [name]    Relationship status: [name]  . Intimate partner violence:    Fear of current or ex partner: [name]    Emotionally abused: [name]    Physically abused: [name]    Forced sexual activity: [name]  Other Topics Concern  . [name]  Social History Narrative  . [name]    FAMILY HISTORY: Family History  Problem Relation Age of Onset  . Diabetes Mother   . Hypertension Mother   . Heart disease Mother        s/p 1 stent  . Hyperlipidemia Mother   . Arthritis Mother   . Cancer Father  COLON  . Colon cancer Father 79  . Irritable bowel syndrome Sister   . Hyperlipidemia Daughter   . Hypertension Daughter   . Hypertension Maternal Grandmother   . Arthritis Maternal Grandmother   . Heart disease Maternal Grandfather        MI  . Hypertension Maternal Grandfather   . Arthritis Maternal Grandfather   . Hypertension Son      REVIEW OF SYSTEMS: Reviewed with the patient as per History of present illness. Psych: Patient denies having dental phobia.  DENTAL HISTORY: CHIEF COMPLAINT: Patient referred by Dr. Maylon Peppers for dental consultation.  HPI: Tami [name] It is a 62 year old female recently diagnosed with multiple myeloma. Patient with anticipated use of Xgeva therapy. Patient is now seen as part of a medically necessary pre-Xgeva therapy dental protocol examination.  The patient currently denies acute toothaches, swellings, or abscesses. Patient was last seen by Dr. Kathaleen Bury in [place], [place] for insertion of a maxillary partial denture in September 2018. The patient's last dental cleaning was in 2017.The patient denies having a lower partial denture. Patient denies having dental phobia.  DENTAL EXAMINATION: GENERAL: The patient is a well-developed, well-nourished female in no acute distress. HEAD AND NECK:  There is no palpable neck lymphadenopathy. The patient denies acute TMJ symptoms but has right TMJ crepitus. INTRAORAL EXAM:  The patient has normal saliva. There is no evidence of oral abscess formation. DENTITION: The patient is missing tooth numbers 1, 3-5, 12, 13, 16, 17, 19, 31, and 32.  PERIODONTAL:  The patient has chronic periodontitis with plaque and clavicles accumulations, gingival recession, and tooth mobility. There is moderate bone loss noted. There is periodontal disease secondary to traumatic occlusion associated with a mandibular anterior teeth numbers 23 through 27. DENTAL CARIES/SUBOPTIMAL RESTORATIONS:  Dental caries are noted on the distal of #26 ENDODONTIC:  The patient currently denies acute pulpitis symptoms. There is no evidence of periapical pathology. Patient has multiple areas of periapical radiopacities. Tooth numbers 9 through 11, and 18-30 all tested EPT positive. CROWN AND BRIDGE: There are no crown or bridge restorations. PROSTHODONTIC: The patient has  a maxillary acrylic partial denture. Patient not being the partial denture with her today. OCCLUSION:  The patient has a poor occlusal scheme secondary to multiple missing teeth, supra-eruption and drifting of the unopposed teeth into the edentulous areas, multiple diastemas, and multiple malpositioned teeth.  The patient also has traumatic occlusion but has led to periodontal disease and loose teeth involving tooth numbers 23-27.  RADIOGRAPHIC INTERPRETATION: An orthopantogram was taken today and supplemented with a full series of dental radiographs. There are multiple missing teeth. There is supra-eruption and drifting of the unopposed teeth into the edentulous areas. There are multiple areas of periapical radiopacities involving the mandibular dentition consistent with periapical cementomas.  Dental caries are noted. Multiple malpositioned teeth are noted. Multiple diastemas are noted.   ASSESSMENTS: 1. Multiple myeloma 2. Pre-Xgeva therapy dental protocol 3. Thrombocytopenia 4. Chronic periodontitis with bone loss 5. Accretions 6. Gingival recession 7. 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 Tooth mobility 9. Multiple missing teeth 10. Multiple diastemas 11. Multiple malpositioned teeth 12. Supra-eruption and drifting of the unopposed teeth into the edentulous areas 13. Malocclusion 14. 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 Risk for bleeding with invasive dental procedures 16. Questionable need for antibiotic premedication prior to invasive dental procedures 17. Need to avoid Cavitron for periodontal therapy due to presence of pacemaker.  PLAN/RECOMMENDATIONS: 1.  I discussed the risks, benefits, and complications of various treatment options with the patient in relationship to her medical and dental conditions, anticipated Xgeva therapy, and future risk for osteonecrosis of the jaw with with invasive dental  procedures related to the anticipated Xgeva therapy. We discussed various treatment options to include no treatment, multiple extractions with alveoloplasty, pre-prosthetic surgery as indicated, periodontal therapy, dental restorations, root canal therapy, crown and bridge therapy, implant therapy, and replacement of missing teeth as indicated. The patient currently wishes to proceed with with follow-up with her primary dentist, Dr. Maricela Bo.  Patient will proceed with initial periodontal therapy followed by evaluation for extraction of tooth numbers 23, 24, 25, 26, and 27 with alveoloplasty to help achieve primary closure.  After adequate healing, the patient can proceed with evaluation for replacement of missing teeth as indicated.  The patient was offered treatment options with an oral surgeon and with Dental Medicine in the operating room with general anesthesia.  The patient refused the referral to an oral surgeon and refused treatment in the operating room at this time. The Xgeva therapy will continue to be held by the medical team until after adequate healing of at least one month after the date of dental extraction procedures.  Dr.Zhao will be contacted to discuss timing of dental procedures with current active chemotherapy regimen.we will also discussed need for antibiotic premedication prior to invasive dental procedures.   2. Discussion of findings with medical team and coordination of future medical and dental care as needed.  I spent in excess of  150 minutes during the conduct of this consultation and >50% of this time involved direct face-to-face encounter for counseling and/or coordination of the patient's care.    Lenn Cal, DDS

## 2018-08-31 ENCOUNTER — Other Ambulatory Visit: Payer: Self-pay | Admitting: Family

## 2018-09-02 ENCOUNTER — Inpatient Hospital Stay (HOSPITAL_BASED_OUTPATIENT_CLINIC_OR_DEPARTMENT_OTHER): Payer: Medicare Other | Admitting: Family

## 2018-09-02 ENCOUNTER — Other Ambulatory Visit: Payer: Self-pay

## 2018-09-02 ENCOUNTER — Inpatient Hospital Stay: Payer: Medicare Other

## 2018-09-02 ENCOUNTER — Ambulatory Visit (INDEPENDENT_AMBULATORY_CARE_PROVIDER_SITE_OTHER): Payer: Medicare Other | Admitting: Family Medicine

## 2018-09-02 VITALS — BP 143/83 | HR 92 | Temp 97.4°F | Resp 18 | Wt 173.0 lb

## 2018-09-02 VITALS — BP 122/80 | HR 92 | Temp 98.1°F | Resp 18 | Wt 175.6 lb

## 2018-09-02 DIAGNOSIS — R197 Diarrhea, unspecified: Secondary | ICD-10-CM

## 2018-09-02 DIAGNOSIS — R109 Unspecified abdominal pain: Secondary | ICD-10-CM | POA: Diagnosis not present

## 2018-09-02 DIAGNOSIS — Z5112 Encounter for antineoplastic immunotherapy: Secondary | ICD-10-CM | POA: Diagnosis not present

## 2018-09-02 DIAGNOSIS — C9000 Multiple myeloma not having achieved remission: Secondary | ICD-10-CM

## 2018-09-02 DIAGNOSIS — I10 Essential (primary) hypertension: Secondary | ICD-10-CM | POA: Diagnosis not present

## 2018-09-02 DIAGNOSIS — K2100 Gastro-esophageal reflux disease with esophagitis, without bleeding: Secondary | ICD-10-CM

## 2018-09-02 DIAGNOSIS — D729 Disorder of white blood cells, unspecified: Secondary | ICD-10-CM

## 2018-09-02 DIAGNOSIS — E041 Nontoxic single thyroid nodule: Secondary | ICD-10-CM | POA: Diagnosis not present

## 2018-09-02 DIAGNOSIS — Z79899 Other long term (current) drug therapy: Secondary | ICD-10-CM | POA: Diagnosis not present

## 2018-09-02 LAB — COMPREHENSIVE METABOLIC PANEL
ALBUMIN: 3.5 g/dL (ref 3.5–5.2)
ALK PHOS: 41 U/L (ref 39–117)
ALT: 17 U/L (ref 0–35)
AST: 18 U/L (ref 0–37)
BILIRUBIN TOTAL: 0.4 mg/dL (ref 0.2–1.2)
BUN: 16 mg/dL (ref 6–23)
CO2: 26 mEq/L (ref 19–32)
Calcium: 9.1 mg/dL (ref 8.4–10.5)
Chloride: 107 mEq/L (ref 96–112)
Creatinine, Ser: 0.68 mg/dL (ref 0.40–1.20)
GFR: 111.94 mL/min (ref >60.00)
Glucose, Bld: 99 mg/dL (ref 70–99)
Potassium: 3.3 mEq/L — ABNORMAL LOW (ref 3.5–5.1)
SODIUM: 140 meq/L (ref 135–145)
TOTAL PROTEIN: 8.4 g/dL — AB (ref 6.0–8.3)

## 2018-09-02 LAB — CMP (CANCER CENTER ONLY)
ALBUMIN: 3.2 g/dL — AB (ref 3.5–5.0)
ALK PHOS: 49 U/L (ref 26–84)
ALT: 30 U/L (ref 10–47)
AST: 25 U/L (ref 11–38)
Anion gap: 0 — ABNORMAL LOW (ref 5–15)
BILIRUBIN TOTAL: 0.6 mg/dL (ref 0.2–1.6)
BUN: 15 mg/dL (ref 7–22)
CALCIUM: 9.5 mg/dL (ref 8.0–10.3)
CO2: 27 mmol/L (ref 18–33)
CREATININE: 0.8 mg/dL (ref 0.60–1.20)
Chloride: 113 mmol/L — ABNORMAL HIGH (ref 98–108)
Glucose, Bld: 94 mg/dL (ref 73–118)
Potassium: 3.1 mmol/L — ABNORMAL LOW (ref 3.3–4.7)
Sodium: 139 mmol/L (ref 128–145)
Total Protein: 9.7 g/dL — ABNORMAL HIGH (ref 6.4–8.1)

## 2018-09-02 LAB — CBC WITH DIFFERENTIAL (CANCER CENTER ONLY)
Abs Immature Granulocytes: 0.02 10*3/uL (ref 0.00–0.07)
Basophils Absolute: 0.1 10*3/uL (ref 0.0–0.1)
Basophils Relative: 2 %
Eosinophils Absolute: 0.4 10*3/uL (ref 0.0–0.5)
Eosinophils Relative: 15 %
HEMATOCRIT: 36.1 % (ref 36.0–46.0)
HEMOGLOBIN: 11.3 g/dL — AB (ref 12.0–15.0)
IMMATURE GRANULOCYTES: 1 %
LYMPHS ABS: 0.8 10*3/uL (ref 0.7–4.0)
LYMPHS PCT: 29 %
MCH: 27.3 pg (ref 26.0–34.0)
MCHC: 31.3 g/dL (ref 30.0–36.0)
MCV: 87.2 fL (ref 80.0–100.0)
Monocytes Absolute: 0.2 10*3/uL (ref 0.1–1.0)
Monocytes Relative: 7 %
NEUTROS ABS: 1.2 10*3/uL — AB (ref 1.7–7.7)
NEUTROS PCT: 46 %
NRBC: 0 % (ref 0.0–0.2)
Platelet Count: 159 10*3/uL (ref 150–400)
RBC: 4.14 MIL/uL (ref 3.87–5.11)
RDW: 15.9 % — ABNORMAL HIGH (ref 11.5–15.5)
WBC Count: 2.6 10*3/uL — ABNORMAL LOW (ref 4.0–10.5)

## 2018-09-02 LAB — CBC WITH DIFFERENTIAL/PLATELET
BASOS ABS: 0.1 10*3/uL (ref 0.0–0.1)
Basophils Relative: 2.8 % (ref 0.0–3.0)
Eosinophils Absolute: 0.4 10*3/uL (ref 0.0–0.7)
Eosinophils Relative: 16.6 % — ABNORMAL HIGH (ref 0.0–5.0)
HCT: 33.7 % — ABNORMAL LOW (ref 36.0–46.0)
HEMOGLOBIN: 11.1 g/dL — AB (ref 12.0–15.0)
LYMPHS ABS: 0.6 10*3/uL — AB (ref 0.7–4.0)
LYMPHS PCT: 25.9 % (ref 12.0–46.0)
MCHC: 32.8 g/dL (ref 30.0–36.0)
MCV: 86.2 fl (ref 78.0–100.0)
Monocytes Absolute: 0.2 10*3/uL (ref 0.1–1.0)
Monocytes Relative: 9.7 % (ref 3.0–12.0)
NEUTROS PCT: 45 % (ref 43.0–77.0)
Neutro Abs: 1 10*3/uL — ABNORMAL LOW (ref 1.4–7.7)
Platelets: 145 10*3/uL — ABNORMAL LOW (ref 150.0–400.0)
RBC: 3.91 Mil/uL (ref 3.87–5.11)
RDW: 17.3 % — ABNORMAL HIGH (ref 11.5–15.5)

## 2018-09-02 MED ORDER — HYDROCODONE-ACETAMINOPHEN 5-325 MG PO TABS: 1.0000 | ORAL_TABLET | ORAL | 0 refills | Status: DC | PRN

## 2018-09-02 MED ORDER — PROCHLORPERAZINE MALEATE 10 MG PO TABS
10.0000 mg | ORAL_TABLET | Freq: Once | ORAL | Status: AC
  Administered 2018-09-02: 10 mg via ORAL

## 2018-09-02 MED ORDER — BORTEZOMIB CHEMO SQ INJECTION 3.5 MG (2.5MG/ML)
1.3000 mg/m2 | Freq: Once | INTRAMUSCULAR | Status: AC
  Administered 2018-09-02: 2.5 mg via SUBCUTANEOUS
  Filled 2018-09-02: qty 1

## 2018-09-02 MED ORDER — FAMOTIDINE 40 MG/5ML PO SUSR: 40.0000 mg | Freq: Every day | ORAL | 5 refills | Status: DC

## 2018-09-02 MED ORDER — PROCHLORPERAZINE MALEATE 10 MG PO TABS
ORAL_TABLET | ORAL | Status: AC
  Filled 2018-09-02: qty 1

## 2018-09-02 NOTE — Patient Instructions (Addendum)
Shingrix is the new shingles shot 2 shots over 2-6 months at pharmacy  Add a probiotic such as Hardin Negus colon Health or Culturelle Diarrhea, Adult Diarrhea is frequent loose and watery bowel movements. Diarrhea can make you feel weak and cause you to become dehydrated. Dehydration can make you tired and thirsty, cause you to have a dry mouth, and decrease how often you urinate. Diarrhea typically lasts 2-3 days. However, it can last longer if it is a sign of something more serious. It is important to treat your diarrhea as told by your health care provider. Follow these instructions at home: Eating and drinking  Follow these recommendations as told by your health care provider:  Take an oral rehydration solution (ORS). This is a drink that is sold at pharmacies and retail stores.  Drink clear fluids, such as water, ice chips, diluted fruit juice, and low-calorie sports drinks.  Eat bland, easy-to-digest foods in small amounts as you are able. These foods include bananas, applesauce, rice, lean meats, toast, and crackers.  Avoid drinking fluids that contain a lot of sugar or caffeine, such as energy drinks, sports drinks, and soda.  Avoid alcohol.  Avoid spicy or fatty foods.  General instructions  Drink enough fluid to keep your urine clear or pale yellow.  Wash your hands often. If soap and water are not available, use hand sanitizer.  Make sure that all people in your household wash their hands well and often.  Take over-the-counter and prescription medicines only as told by your health care provider.  Rest at home while you recover.  Watch your condition for any changes.  Take a warm bath to relieve any burning or pain from frequent diarrhea episodes.  Keep all follow-up visits as told by your health care provider. This is important. Contact a health care provider if:  You have a fever.  Your diarrhea gets worse.  You have new symptoms.  You cannot keep fluids  down.  You feel light-headed or dizzy.  You have a headache  You have muscle cramps. Get help right away if:  You have chest pain.  You feel extremely weak or you faint.  You have bloody or black stools or stools that look like tar.  You have severe pain, cramping, or bloating in your abdomen.  You have trouble breathing or you are breathing very quickly.  Your heart is beating very quickly.  Your skin feels cold and clammy.  You feel confused.  You have signs of dehydration, such as: ? Dark urine, very little urine, or no urine. ? Cracked lips. ? Dry mouth. ? Sunken eyes. ? Sleepiness. ? Weakness. This information is not intended to replace advice given to you by your health care provider. Make sure you discuss any questions you have with your health care provider. Document Released: 10/16/2002 Document Revised: 03/05/2016 Document Reviewed: 07/02/2015 Elsevier Interactive Patient Education  [employer].

## 2018-09-02 NOTE — Progress Notes (Signed)
Hematology and Oncology Follow Up Visit  Tami [name] [id number] 1956/07/13 62 y.o. 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 She is feeling a little fatigued. She has been having diarrhea with abdominal cramps 6 times a day for 3 days. She was able to see her PCP this morning and they are going to test her for C diff (she has history of this).  She states that she is hydrating well with water and Gatorade and she states she has a good appetite. Her weight is stable.  She states that she was treated recently with antibiotics for a cold.  She was able to see Dr. Enrique Sack and will be needing to have some dental work including teeth extractions. He plans to speak with Dr. Marin Olp on Monday about when this can be done for her.  No episodes of bleeding, no bruising or petechiae.  No fever, n/v, cough, rash, dizziness, SOB, chest pain, palpitations, abdominal pain or changes in bladder habits.  She has occasional episodes of blurry vision.  No swelling, tenderness, numbness or tingling in her extremities.  No lymphadenopathy noted on exam.   ECOG Performance Status: 1 - Symptomatic but completely ambulatory  Medications:  Allergies as of 09/02/2018      Reactions   Benazepril Anaphylaxis, Swelling   angioedema Throat and lip swelling   Codeine Nausea And Vomiting   Morphine    Redness and hives noted post IV admin on 07/05/17   Ondansetron Hcl    Redness and hives post IV admin on 07/05/17      Medication List        Accurate as of 09/02/18 11:31 AM. Always use your most recent med list.          acetaminophen 325 MG tablet Commonly known as:  TYLENOL Take 650 mg by mouth.   albuterol 108 (90 Base) MCG/ACT inhaler Commonly known as:  PROVENTIL HFA;VENTOLIN HFA Inhale 2 puffs into the lungs every 6 (six)  hours as needed for wheezing or shortness of breath.   amLODipine 10 MG tablet Commonly known as:  NORVASC Take 1 tablet (10 mg total) by mouth daily.   aspirin EC 81 MG tablet Take 1 tablet (81 mg total) by mouth daily.   carvedilol 25 MG tablet Commonly known as:  COREG Take 1 tablet (25 mg total) by mouth 2 (two) times daily with a meal.   cyclobenzaprine 10 MG tablet Commonly known as:  FLEXERIL Take by mouth.   dexamethasone 4 MG tablet Commonly known as:  DECADRON Take 5 pills at one time with food ONCE a WEEK!!   dicyclomine 20 MG tablet Commonly known as:  BENTYL Take 1 tablet (20 mg total) by mouth 3 (three) times daily as needed for spasms.   famciclovir 250 MG tablet Commonly known as:  FAMVIR Take 1 tablet (250 mg total) by mouth daily.   famotidine 40 MG/5ML suspension Commonly known as:  PEPCID Take 5 mLs (40 mg total) by mouth at bedtime.   fluticasone 0.05 % cream Commonly known as:  CUTIVATE Apply topically 2 (two) times daily.   fluticasone 50 MCG/ACT nasal spray Commonly known as:  FLONASE Place 2 sprays into both nostrils daily.   HYDROcodone-acetaminophen 5-325 MG tablet Commonly known as:  NORCO/VICODIN Take 1 tablet by mouth every 4 (four) hours  as needed.   hyoscyamine 0.125 MG SL tablet Commonly known as:  LEVSIN SL Place 1 tablet (0.125 mg total) under the tongue every 4 (four) hours as needed.   lenalidomide 20 MG capsule Commonly known as:  REVLIMID Take 1 pill daily for 3 weeks on and 1 week off.  Take at bedtime [id number]   levocetirizine 5 MG tablet Commonly known as:  XYZAL Take 1 tablet (5 mg total) by mouth every evening.   lidocaine 5 % Commonly known as:  LIDODERM Place 1 patch onto the skin daily. Remove & Discard patch within 12 hours or as directed by MD   ondansetron 4 MG tablet Commonly known as:  ZOFRAN Take 1 tablet (4 mg total) by mouth every 8 (eight) hours as needed for nausea or vomiting.   pantoprazole  40 MG tablet Commonly known as:  PROTONIX Take 1 tablet (40 mg total) by mouth 2 (two) times daily.   potassium chloride SA 20 MEQ tablet Commonly known as:  K-DUR,KLOR-CON Take 1 tablet (20 mEq total) by mouth daily.   PREMARIN vaginal cream Generic drug:  conjugated estrogens Place vaginally.   prochlorperazine 10 MG tablet Commonly known as:  COMPAZINE Take 1 tablet (10 mg total) by mouth every 6 (six) hours as needed (Nausea or vomiting).   sucralfate 1 g tablet Commonly known as:  CARAFATE Take 1 tablet (1 g total) by mouth 4 (four) times daily -  with meals and at bedtime.   topiramate 25 MG tablet Commonly known as:  TOPAMAX Take 1 tablet (25 mg total) by mouth 2 (two) times daily.       Allergies:  Allergies  Allergen Reactions  . Benazepril Anaphylaxis and Swelling    angioedema Throat and lip swelling  . Codeine Nausea And Vomiting  . Morphine     Redness and hives noted post IV admin on 07/05/17  . Ondansetron Hcl     Redness and hives post IV admin on 07/05/17    Past Medical History, Surgical history, Social history, and Family History were reviewed and updated.  Review of Systems: All other 10 point review of systems is negative.   Physical Exam:  weight is 173 lb (78.5 kg). Her oral temperature is 97.4 F (36.3 C) (abnormal). Her blood pressure is 143/83 (abnormal) and her pulse is 92. Her respiration is 18 and oxygen saturation is 94%.   Wt Readings from Last 3 Encounters:  09/02/18 173 lb (78.5 kg)  09/02/18 175 lb 9.6 oz (79.7 kg)  08/26/18 175 lb (79.4 kg)    Ocular: Sclerae unicteric, pupils equal, round and reactive to light Ear-nose-throat: Oropharynx clear, dentition fair Lymphatic: No cervical, supraclavicular or axillary adenopathy Lungs no rales or rhonchi, good excursion bilaterally Heart regular rate and rhythm, no murmur appreciated Abd soft, nontender, positive bowel sounds, no liver or spleen tip palpated on exam, no fluid  wave  MSK no focal spinal tenderness, no joint edema Neuro: non-focal, well-oriented, appropriate affect Breasts: Deferred   Lab Results  Component Value Date   WBC 2.6 (L) 09/02/2018   HGB 11.3 (L) 09/02/2018   HCT 36.1 09/02/2018   MCV 87.2 09/02/2018   PLT 159 09/02/2018   Lab Results  Component Value Date   FERRITIN 169 09/16/2016   IRON 59 09/16/2016   TIBC 281 09/16/2016   UIBC 222 09/16/2016   IRONPCTSAT 21 09/16/2016   Lab Results  Component Value Date   RETICCTPCT 0.8 07/16/2016   RBC 4.14 09/02/2018  RETICCTABS 32,640 07/16/2016   Lab Results  Component Value Date   KPAFRELGTCHN 93.6 (H) 08/05/2018   LAMBDASER 13.9 08/05/2018   KAPLAMBRATIO 6.73 (H) 08/05/2018   Lab Results  Component Value Date   IGGSERUM 4,466 (H) 08/05/2018   IGGSERUM 4,166 (H) 08/05/2018   IGA 294 08/05/2018   IGA 289 08/05/2018   IGMSERUM 151 08/05/2018   IGMSERUM 150 08/05/2018   Lab Results  Component Value Date   TOTALPROTELP 9.0 (H) 08/05/2018   ALBUMINELP 3.3 08/05/2018   A1GS 0.3 08/05/2018   A2GS 0.9 08/05/2018   BETS 1.1 08/05/2018   BETA2SER 0.5 07/16/2016   GAMS 3.5 (H) 08/05/2018   MSPIKE 2.8 (H) 08/05/2018   SPEI SEE NOTE 07/16/2016     Chemistry      Component Value Date/Time   NA 137 08/19/2018 0852   NA 143 11/08/2017 1127   NA 140 12/31/2016 1000   K 3.2 (L) 08/19/2018 0852   K 4.0 11/08/2017 1127   K 3.6 12/31/2016 1000   CL 112 (H) 08/19/2018 0852   CL 107 11/08/2017 1127   CO2 27 08/19/2018 0852   CO2 25 11/08/2017 1127   CO2 24 12/31/2016 1000   BUN 14 08/19/2018 0852   BUN 19 11/08/2017 1127   BUN 22.3 12/31/2016 1000   CREATININE 0.90 08/19/2018 0852   CREATININE 0.9 11/08/2017 1127   CREATININE 0.8 12/31/2016 1000      Component Value Date/Time   CALCIUM 8.4 08/19/2018 0852   CALCIUM 9.9 11/08/2017 1127   CALCIUM 9.5 12/31/2016 1000   ALKPHOS 46 08/19/2018 0852   ALKPHOS 44 11/08/2017 1127   ALKPHOS 51 12/31/2016 1000   AST 40 (H)  08/19/2018 0852   AST 28 12/31/2016 1000   ALT 27 08/19/2018 0852   ALT 36 11/08/2017 1127   ALT 16 12/31/2016 1000   BILITOT 0.5 08/19/2018 0852   BILITOT 0.46 12/31/2016 1000      Impression and Plan: Ms. 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  She has had dirrhea 6 times a day for 3 days and is being checked for C diff by her PCP.  She is tolerating Revlimid nicely.  I spoke with DR. Maylon Peppers and we will proceed with Velcade today as planned. [name] is on hold.  We will see her in another 3 weeks.  She will contact our office with any questions or concerns. We can certainly see her sooner if need be.   Laverna Peace, NP 10/25/201911:31 AM

## 2018-09-02 NOTE — Progress Notes (Signed)
Okay to treat with ANC = 1.2 per Dr. Maylon Peppers.

## 2018-09-02 NOTE — Patient Instructions (Signed)

## 2018-09-03 LAB — IGG, IGA, IGM
IGG (IMMUNOGLOBIN G), SERUM: 3289 mg/dL — AB (ref 700–1600)
IgA: 215 mg/dL (ref 87–352)
IgM (Immunoglobulin M), Srm: 139 mg/dL (ref 26–217)

## 2018-09-04 DIAGNOSIS — R197 Diarrhea, unspecified: Secondary | ICD-10-CM | POA: Insufficient documentation

## 2018-09-04 DIAGNOSIS — A0472 Enterocolitis due to Clostridium difficile, not specified as recurrent: Secondary | ICD-10-CM | POA: Insufficient documentation

## 2018-09-04 NOTE — Assessment & Plan Note (Signed)
Denies CP/palp/SOB/HA/congestion/fevers/GI or GU c/o. Taking meds as prescribed 

## 2018-09-04 NOTE — Progress Notes (Signed)
Subjective:    [name], female    DOB: November 14, 1955, 62 y.o.   MRN: [id number]  No chief complaint on file.   HPI Patient is in today for follow-up.  She had recently been ill with a respiratory infection and was started on a course of azithromycin.  Her congestion and respiratory symptoms have resolved but unfortunately over the past week she is developed frequent loose stool again.  3-6 movements a day.  No bloody or tarry stool but loose and frequent is noted.  She has also noted some abdominal cramping, heartburn and back pain.  No fevers or chills. Denies CP/palp/SOB/HA/congestion/fevers or GU c/o. Taking meds as prescribed Past Medical History:  Diagnosis Date  . Abnormal SPEP 07/26/2016  . Arthritis    knees, hands  . Back pain 07/14/2016  . Bowel obstruction ([place]) 11/2014  . C. difficile diarrhea   . CHF (congestive heart failure) ([place])   . Congestive heart failure ([place]) 02/24/2015   S/p pacemaker  . Depression   . Elevated sed rate 08/15/2017  . Epigastric pain 03/22/2017  . GERD (gastroesophageal reflux disease)   . Hyperlipidemia   . Hypertension   . 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 Low back pain 07/14/2016  . 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 Multiple myeloma ([place]) 07/20/2018  . Multiple myeloma not having achieved remission ([place]) 07/20/2018  . Myalgia 12/31/2016  . Obstructive sleep apnea 03/31/2015  . SOB (shortness of breath) 04/09/2016  . Stroke [facility])    TIAs    Past Surgical History:  Procedure Laterality Date  . ABDOMINAL HYSTERECTOMY     menorraghia, 2006, total  . CHOLECYSTECTOMY    . KNEE SURGERY     right, repair torn torn cartialage  . PACEMAKER INSERTION  08/2014  . TONSILLECTOMY    . TUBAL LIGATION      Family History  Problem Relation Age of Onset  . Diabetes Mother   . Hypertension Mother   . Heart disease Mother        s/p 1 stent  . Hyperlipidemia Mother   . Arthritis Mother   . Cancer  Father        COLON  . Colon cancer Father 23  . Irritable bowel syndrome Sister   . Hyperlipidemia Daughter   . Hypertension Daughter   . Hypertension Maternal Grandmother   . Arthritis Maternal Grandmother   . Heart disease Maternal Grandfather        MI  . Hypertension Maternal Grandfather   . Arthritis Maternal Grandfather   . Hypertension Son     Social History   Socioeconomic History  . Marital status: Divorced    Spouse name: [name]  . Number of children: 3  . Years of education: [name]  . Highest education level: [name]  Occupational History  . [name]  Social Needs  . Financial resource strain: [name]  . Food insecurity:    Worry: [name]    Inability: [name]  . Transportation needs:    Medical: [name]    Non-medical: [name]  Tobacco Use  . Smoking status: Never Smoker  . Smokeless tobacco: Never Used  Substance and Sexual Activity  . Alcohol use: No    Alcohol/week: 0.0 standard drinks  . Drug use: No  . Sexual activity: [name]  Lifestyle  . Physical activity:    Days per week:  [name]    Minutes per session: [name]  . Stress: [name]  Relationships  . Social connections:    Talks on phone: [name]    Gets together: [name]    Attends religious service: [name]    Active member of club or organization: [name]    Attends meetings of clubs or organizations: [name]    Relationship status: [name]  . Intimate partner violence:    Fear of current or ex partner: [name]    Emotionally abused: [name]    Physically abused: [name]    Forced sexual activity: [name]  Other Topics Concern  . [name]  Social History Narrative  . [name]    Outpatient Medications Prior to Visit  Medication Sig Dispense Refill  . acetaminophen (TYLENOL) 325 MG tablet Take 650 mg by mouth.    Marland Kitchen albuterol (PROVENTIL HFA;VENTOLIN HFA) 108 (90 Base) MCG/ACT  inhaler Inhale 2 puffs into the lungs every 6 (six) hours as needed for wheezing or shortness of breath. 1 Inhaler 2  . amLODipine (NORVASC) 10 MG tablet Take 1 tablet (10 mg total) by mouth daily. 90 tablet 1  . aspirin EC 81 MG tablet Take 1 tablet (81 mg total) by mouth daily. 90 tablet 1  . carvedilol (COREG) 25 MG tablet Take 1 tablet (25 mg total) by mouth 2 (two) times daily with a meal. 180 tablet 1  . conjugated estrogens (PREMARIN) vaginal cream Place vaginally.    . cyclobenzaprine (FLEXERIL) 10 MG tablet Take by mouth.    . dexamethasone (DECADRON) 4 MG tablet Take 5 pills at one time with food ONCE a WEEK!! 100 tablet 2  . dicyclomine (BENTYL) 20 MG tablet Take 1 tablet (20 mg total) by mouth 3 (three) times daily as needed for spasms. 90 tablet 1  . famciclovir (FAMVIR) 250 MG tablet Take 1 tablet (250 mg total) by mouth daily. 30 tablet 12  . fluticasone (CUTIVATE) 0.05 % cream Apply topically 2 (two) times daily. 30 g 5  . fluticasone (FLONASE) 50 MCG/ACT nasal spray Place 2 sprays into both nostrils daily. 16 g 1  . hyoscyamine (LEVSIN SL) 0.125 MG SL tablet Place 1 tablet (0.125 mg total) under the tongue every 4 (four) hours as needed. 30 tablet 2  . lenalidomide (REVLIMID) 20 MG capsule Take 1 pill daily for 3 weeks on and 1 week off.  Take at bedtime [id number] 21 capsule 1  . levocetirizine (XYZAL) 5 MG tablet Take 1 tablet (5 mg total) by mouth every evening. 30 tablet 2  . lidocaine (LIDODERM) 5 % Place 1 patch onto the skin daily. Remove & Discard patch within 12 hours or as directed by MD 30 patch 2  . ondansetron (ZOFRAN) 4 MG tablet Take 1 tablet (4 mg total) by mouth every 8 (eight) hours as needed for nausea or vomiting. 30 tablet 1  . pantoprazole (PROTONIX) 40 MG tablet Take 1 tablet (40 mg total) by mouth 2 (two) times daily. 180 tablet 1  . potassium chloride SA (KLOR-CON M20) 20 MEQ tablet Take 1 tablet (20 mEq total) by mouth daily. 90 tablet 1  .  prochlorperazine (COMPAZINE) 10 MG tablet Take 1 tablet (10 mg total) by mouth every 6 (six) hours as needed (Nausea or vomiting). 30 tablet 1  . sucralfate (CARAFATE) 1 g tablet Take 1 tablet (  1 g total) by mouth 4 (four) times daily -  with meals and at bedtime. 120 tablet 0  . topiramate (TOPAMAX) 25 MG tablet Take 1 tablet (25 mg total) by mouth 2 (two) times daily. 180 tablet 0  . HYDROcodone-acetaminophen (NORCO/VICODIN) 5-325 MG tablet Take 1 tablet by mouth every 4 (four) hours as needed. 15 tablet 0  . ranitidine (ZANTAC) 75 MG/5ML syrup Take by mouth.     No facility-administered medications prior to visit.     Allergies  Allergen Reactions  . Benazepril Anaphylaxis and Swelling    angioedema Throat and lip swelling  . Codeine Nausea And Vomiting  . Morphine     Redness and hives noted post IV admin on 07/05/17  . Ondansetron Hcl     Redness and hives post IV admin on 07/05/17    Review of Systems  Constitutional: Positive for malaise/fatigue. Negative for fever.  HENT: Negative for congestion.   Eyes: Negative for blurred vision.  Respiratory: Negative for shortness of breath.   Cardiovascular: Negative for chest pain, palpitations and leg swelling.  Gastrointestinal: Positive for diarrhea, heartburn and nausea. Negative for abdominal pain and blood in stool.  Genitourinary: Negative for dysuria and frequency.  Musculoskeletal: Positive for back pain. Negative for falls.  Skin: Negative for rash.  Neurological: Negative for dizziness, loss of consciousness and headaches.  Endo/Heme/Allergies: Negative for environmental allergies.  Psychiatric/Behavioral: Positive for depression. The patient is not nervous/anxious.        Objective:    Physical Exam  Constitutional: She is oriented to person, place, and time. She appears well-developed and well-nourished. No distress.  HENT:  Head: Normocephalic and atraumatic.  Nose: Nose normal.  Eyes: Right eye exhibits no  discharge. Left eye exhibits no discharge.  Neck: Normal range of motion. Neck supple.  Cardiovascular: Normal rate and regular rhythm.  No murmur heard. Pulmonary/Chest: Effort normal and breath sounds normal.  Abdominal: Soft. Bowel sounds are normal. There is no tenderness.  Musculoskeletal: She exhibits no edema.  Neurological: She is alert and oriented to person, place, and time.  Skin: Skin is warm and dry.  Psychiatric: She has a normal mood and affect.  Nursing note and vitals reviewed.   BP 122/80 (BP Location: Left Arm, Patient Position: Sitting, Cuff Size: Normal)   Pulse 92   Temp 98.1 F (36.7 C) (Oral)   Resp 18   Wt 175 lb 9.6 oz (79.7 kg)   SpO2 97%   BMI 27.50 kg/m  Wt Readings from Last 3 Encounters:  09/02/18 173 lb (78.5 kg)  09/02/18 175 lb 9.6 oz (79.7 kg)  08/26/18 175 lb (79.4 kg)     Lab Results  Component Value Date   WBC 2.6 (L) 09/02/2018   HGB 11.3 (L) 09/02/2018   HCT 36.1 09/02/2018   PLT 159 09/02/2018   GLUCOSE 94 09/02/2018   CHOL 137 02/15/2018   TRIG 100.0 02/15/2018   HDL 45.90 02/15/2018   LDLCALC 72 02/15/2018   ALT 30 09/02/2018   AST 25 09/02/2018   NA 139 09/02/2018   K 3.1 (L) 09/02/2018   CL 113 (H) 09/02/2018   CREATININE 0.80 09/02/2018   BUN 15 09/02/2018   CO2 27 09/02/2018   TSH 1.79 02/15/2018   INR 0.90 07/28/2018    Lab Results  Component Value Date   TSH 1.79 02/15/2018   Lab Results  Component Value Date   WBC 2.6 (L) 09/02/2018   HGB 11.3 (L) 09/02/2018  HCT 36.1 09/02/2018   MCV 87.2 09/02/2018   PLT 159 09/02/2018   Lab Results  Component Value Date   NA 139 09/02/2018   K 3.1 (L) 09/02/2018   CHLORIDE 108 12/31/2016   CO2 27 09/02/2018   GLUCOSE 94 09/02/2018   BUN 15 09/02/2018   CREATININE 0.80 09/02/2018   BILITOT 0.6 09/02/2018   ALKPHOS 49 09/02/2018   AST 25 09/02/2018   ALT 30 09/02/2018   PROT 9.7 (H) 09/02/2018   ALBUMIN 3.2 (L) 09/02/2018   CALCIUM 9.5 09/02/2018    ANIONGAP 0.0 (L) 09/02/2018   EGFR >90 12/31/2016   GFR 111.94 09/02/2018   Lab Results  Component Value Date   CHOL 137 02/15/2018   Lab Results  Component Value Date   HDL 45.90 02/15/2018   Lab Results  Component Value Date   LDLCALC 72 02/15/2018   Lab Results  Component Value Date   TRIG 100.0 02/15/2018   Lab Results  Component Value Date   CHOLHDL 3 02/15/2018   No results found for: HGBA1C     Assessment & Plan:   Problem List Items Addressed This Visit    Multiple myeloma ([place]) (Chronic)    Is following with oncology closely and no new concerns or changes      GERD (gastroesophageal reflux disease)    Avoid offending foods, start probiotics. Do not eat large meals in late evening and consider raising head of bed.       Relevant Medications   famotidine (PEPCID) 40 MG/5ML suspension   Hypertension    Denies CP/palp/SOB/HA/congestion/fevers/GI or GU c/o. Taking meds as prescribed      Diarrhea - Primary    Has started noting an increase in stool frequency again recently. Notably after a course of Azithromycin. Will repeat a test for CDiff and she is asked to eat a bland diet for now and hydrate well. Seek care if gets worth.       Relevant Orders   CBC w/Diff (Completed)   Comprehensive metabolic panel (Completed)   C. difficile GDH and Toxin A/B      I have discontinued Lannette Donath ranitidine. I am also having her start on famotidine. Additionally, I am having her maintain her conjugated estrogens, acetaminophen, carvedilol, pantoprazole, aspirin EC, dicyclomine, ondansetron, lidocaine, topiramate, levocetirizine, sucralfate, famciclovir, hyoscyamine, prochlorperazine, dexamethasone, amLODipine, potassium chloride SA, fluticasone, albuterol, fluticasone, cyclobenzaprine, lenalidomide, and HYDROcodone-acetaminophen.  Meds ordered this encounter  Medications  . HYDROcodone-acetaminophen (NORCO/VICODIN) 5-325 MG tablet    Sig: Take 1 tablet by  mouth every 4 (four) hours as needed.    Dispense:  15 tablet    Refill:  0  . famotidine (PEPCID) 40 MG/5ML suspension    Sig: Take 5 mLs (40 mg total) by mouth at bedtime.    Dispense:  150 mL    Refill:  5     Penni Homans, MD

## 2018-09-04 NOTE — Assessment & Plan Note (Signed)
Has started noting an increase in stool frequency again recently. Notably after a course of Azithromycin. Will repeat a test for CDiff and she is asked to eat a bland diet for now and hydrate well. Seek care if gets worth.

## 2018-09-04 NOTE — Assessment & Plan Note (Signed)
Avoid offending foods, start probiotics. Do not eat large meals in late evening and consider raising head of bed.  

## 2018-09-04 NOTE — Assessment & Plan Note (Signed)
Is following with oncology closely and no new concerns or changes

## 2018-09-05 ENCOUNTER — Other Ambulatory Visit: Payer: Medicare Other

## 2018-09-05 DIAGNOSIS — R197 Diarrhea, unspecified: Secondary | ICD-10-CM | POA: Diagnosis not present

## 2018-09-05 LAB — KAPPA/LAMBDA LIGHT CHAINS
KAPPA FREE LGHT CHN: 51.1 mg/L — AB (ref 3.3–19.4)
Kappa, lambda light chain ratio: 3.84 — ABNORMAL HIGH (ref 0.26–1.65)
Lambda free light chains: 13.3 mg/L (ref 5.7–26.3)

## 2018-09-05 MED ORDER — POTASSIUM CHLORIDE CRYS ER 20 MEQ PO TBCR: 20.0000 meq | EXTENDED_RELEASE_TABLET | Freq: Every day | ORAL | 1 refills | Status: DC

## 2018-09-05 NOTE — Addendum Note (Signed)
Addended by: Magdalene Molly A on: 09/05/2018 01:16 PM   Modules accepted: Orders

## 2018-09-05 NOTE — Addendum Note (Signed)
Addended by: Magdalene Molly A on: 09/05/2018 01:17 PM   Modules accepted: Orders

## 2018-09-06 ENCOUNTER — Ambulatory Visit (HOSPITAL_BASED_OUTPATIENT_CLINIC_OR_DEPARTMENT_OTHER)
Admission: RE | Admit: 2018-09-06 | Discharge: 2018-09-06 | Disposition: A | Discharge status: Home / Self Care | Payer: Medicare Other | Source: Ambulatory Visit | Attending: Hematology | Admitting: Hematology

## 2018-09-06 DIAGNOSIS — E041 Nontoxic single thyroid nodule: Secondary | ICD-10-CM

## 2018-09-06 DIAGNOSIS — E042 Nontoxic multinodular goiter: Secondary | ICD-10-CM | POA: Insufficient documentation

## 2018-09-06 LAB — C. DIFFICILE GDH AND TOXIN A/B
GDH ANTIGEN: NOT DETECTED
MICRO NUMBER:: 91293285
SPECIMEN QUALITY:: ADEQUATE
TOXIN A AND B: NOT DETECTED

## 2018-09-08 ENCOUNTER — Telehealth: Payer: Self-pay | Admitting: *Deleted

## 2018-09-08 LAB — PROTEIN ELECTROPHORESIS, SERUM, WITH REFLEX
A/G Ratio: 0.7 (ref 0.7–1.7)
ALBUMIN ELP: 3.4 g/dL (ref 2.9–4.4)
ALPHA-2-GLOBULIN: 0.9 g/dL (ref 0.4–1.0)
Alpha-1-Globulin: 0.3 g/dL (ref 0.0–0.4)
Beta Globulin: 1.2 g/dL (ref 0.7–1.3)
Gamma Globulin: 2.9 g/dL — ABNORMAL HIGH (ref 0.4–1.8)
Globulin, Total: 5.2 g/dL — ABNORMAL HIGH (ref 2.2–3.9)
M-Spike, %: 2.1 g/dL — ABNORMAL HIGH
SPEP Interpretation: 0
TOTAL PROTEIN ELP: 8.6 g/dL — AB (ref 6.0–8.5)

## 2018-09-08 LAB — IMMUNOFIXATION REFLEX, SERUM
IgA: 255 mg/dL (ref 87–352)
IgG (Immunoglobin G), Serum: 3746 mg/dL — ABNORMAL HIGH (ref 700–1600)
IgM (Immunoglobulin M), Srm: 162 mg/dL (ref 26–217)

## 2018-09-08 NOTE — Telephone Encounter (Signed)
-----   Message from [name], MD sent at 09/07/2018  5:59 PM EDT ----- Call - the thyroid ultrasound showed several nodules, but none look like cancer.  Will need another u/s in 1 year.  pete

## 2018-09-08 NOTE — Telephone Encounter (Signed)
Patient notified per order of Dr. Marin Olp that the thyroid ultrasound showed several nodules, but none look like cancer.  Will need another u/s in 1 year.  Patient appreciative of call and has no questions or concerns at this time.

## 2018-09-09 ENCOUNTER — Inpatient Hospital Stay: Payer: Medicare Other

## 2018-09-09 ENCOUNTER — Telehealth: Payer: Self-pay | Admitting: *Deleted

## 2018-09-09 ENCOUNTER — Inpatient Hospital Stay: Payer: Medicare Other | Attending: Hematology & Oncology

## 2018-09-09 VITALS — BP 144/87 | HR 87 | Temp 98.1°F | Resp 18

## 2018-09-09 DIAGNOSIS — C9000 Multiple myeloma not having achieved remission: Secondary | ICD-10-CM

## 2018-09-09 DIAGNOSIS — Z5112 Encounter for antineoplastic immunotherapy: Secondary | ICD-10-CM | POA: Diagnosis not present

## 2018-09-09 DIAGNOSIS — R197 Diarrhea, unspecified: Secondary | ICD-10-CM | POA: Insufficient documentation

## 2018-09-09 DIAGNOSIS — Z79899 Other long term (current) drug therapy: Secondary | ICD-10-CM | POA: Diagnosis not present

## 2018-09-09 LAB — CMP (CANCER CENTER ONLY)
ALT: 20 U/L (ref 10–47)
AST: 32 U/L (ref 11–38)
Albumin: 3.3 g/dL — ABNORMAL LOW (ref 3.5–5.0)
Alkaline Phosphatase: 47 U/L (ref 26–84)
Anion gap: 3 — ABNORMAL LOW (ref 5–15)
BUN: 11 mg/dL (ref 7–22)
CO2: 26 mmol/L (ref 18–33)
Calcium: 9 mg/dL (ref 8.0–10.3)
Chloride: 111 mmol/L — ABNORMAL HIGH (ref 98–108)
Creatinine: 0.7 mg/dL (ref 0.60–1.20)
GLUCOSE: 114 mg/dL (ref 73–118)
POTASSIUM: 3.2 mmol/L — AB (ref 3.3–4.7)
Sodium: 140 mmol/L (ref 128–145)
Total Bilirubin: 0.7 mg/dL (ref 0.2–1.6)
Total Protein: 8.8 g/dL — ABNORMAL HIGH (ref 6.4–8.1)

## 2018-09-09 LAB — CBC WITH DIFFERENTIAL (CANCER CENTER ONLY)
Abs Immature Granulocytes: 0.01 10*3/uL (ref 0.00–0.07)
Basophils Absolute: 0 10*3/uL (ref 0.0–0.1)
Basophils Relative: 2 %
EOS ABS: 0.3 10*3/uL (ref 0.0–0.5)
Eosinophils Relative: 16 %
HCT: 36.6 % (ref 36.0–46.0)
Hemoglobin: 11.3 g/dL — ABNORMAL LOW (ref 12.0–15.0)
Immature Granulocytes: 1 %
Lymphocytes Relative: 36 %
Lymphs Abs: 0.8 10*3/uL (ref 0.7–4.0)
MCH: 27.2 pg (ref 26.0–34.0)
MCHC: 30.9 g/dL (ref 30.0–36.0)
MCV: 88.2 fL (ref 80.0–100.0)
MONO ABS: 0.2 10*3/uL (ref 0.1–1.0)
MONOS PCT: 11 %
Neutro Abs: 0.7 10*3/uL — ABNORMAL LOW (ref 1.7–7.7)
Neutrophils Relative %: 34 %
PLATELETS: 103 10*3/uL — AB (ref 150–400)
RBC: 4.15 MIL/uL (ref 3.87–5.11)
RDW: 15.9 % — AB (ref 11.5–15.5)
WBC Count: 2.1 10*3/uL — ABNORMAL LOW (ref 4.0–10.5)
nRBC: 0 % (ref 0.0–0.2)

## 2018-09-09 MED ORDER — BORTEZOMIB CHEMO SQ INJECTION 3.5 MG (2.5MG/ML)
1.3000 mg/m2 | Freq: Once | INTRAMUSCULAR | Status: AC
  Administered 2018-09-09: 2.5 mg via SUBCUTANEOUS
  Filled 2018-09-09: qty 1

## 2018-09-09 MED ORDER — PROCHLORPERAZINE MALEATE 10 MG PO TABS
10.0000 mg | ORAL_TABLET | Freq: Once | ORAL | Status: AC
  Administered 2018-09-09: 10 mg via ORAL

## 2018-09-09 MED ORDER — PROCHLORPERAZINE MALEATE 10 MG PO TABS
ORAL_TABLET | ORAL | Status: AC
  Filled 2018-09-09: qty 1

## 2018-09-09 NOTE — Patient Instructions (Signed)

## 2018-09-09 NOTE — Telephone Encounter (Signed)
Notified pt per MD- Myeloma is still getting better. Pt verbalized understanding. No further concerns.

## 2018-09-09 NOTE — Telephone Encounter (Signed)
-----   Message from [name], MD sent at 09/08/2018  5:47 PM EDT ----- Call - myeloma is still getting better!  pete

## 2018-09-09 NOTE — Progress Notes (Signed)
Okay to treat with ANC = 0.7 per Dr. Marin Olp.

## 2018-09-09 NOTE — Progress Notes (Signed)
Per Dr. Ennever, it's OK to treat with today's labs.   

## 2018-09-16 ENCOUNTER — Inpatient Hospital Stay: Payer: Self-pay

## 2018-09-16 ENCOUNTER — Inpatient Hospital Stay: Payer: Medicare Other

## 2018-09-16 ENCOUNTER — Other Ambulatory Visit: Payer: Self-pay

## 2018-09-16 VITALS — BP 115/74 | HR 77 | Temp 98.4°F | Resp 18

## 2018-09-16 DIAGNOSIS — Z5112 Encounter for antineoplastic immunotherapy: Secondary | ICD-10-CM | POA: Diagnosis not present

## 2018-09-16 DIAGNOSIS — C9000 Multiple myeloma not having achieved remission: Secondary | ICD-10-CM

## 2018-09-16 DIAGNOSIS — R197 Diarrhea, unspecified: Secondary | ICD-10-CM | POA: Diagnosis not present

## 2018-09-16 DIAGNOSIS — Z79899 Other long term (current) drug therapy: Secondary | ICD-10-CM | POA: Diagnosis not present

## 2018-09-16 LAB — CBC WITH DIFFERENTIAL (CANCER CENTER ONLY)
ABS IMMATURE GRANULOCYTES: 0.01 10*3/uL (ref 0.00–0.07)
BASOS ABS: 0.1 10*3/uL (ref 0.0–0.1)
Basophils Relative: 2 %
EOS PCT: 7 %
Eosinophils Absolute: 0.1 10*3/uL (ref 0.0–0.5)
HCT: 33.7 % — ABNORMAL LOW (ref 36.0–46.0)
HEMOGLOBIN: 10.3 g/dL — AB (ref 12.0–15.0)
Immature Granulocytes: 1 %
LYMPHS PCT: 34 %
Lymphs Abs: 0.7 10*3/uL (ref 0.7–4.0)
MCH: 27.1 pg (ref 26.0–34.0)
MCHC: 30.6 g/dL (ref 30.0–36.0)
MCV: 88.7 fL (ref 80.0–100.0)
Monocytes Absolute: 0.3 10*3/uL (ref 0.1–1.0)
Monocytes Relative: 14 %
NEUTROS ABS: 0.9 10*3/uL — AB (ref 1.7–7.7)
NRBC: 0 % (ref 0.0–0.2)
Neutrophils Relative %: 42 %
Platelet Count: 108 10*3/uL — ABNORMAL LOW (ref 150–400)
RBC: 3.8 MIL/uL — AB (ref 3.87–5.11)
RDW: 16 % — ABNORMAL HIGH (ref 11.5–15.5)
WBC Count: 2.1 10*3/uL — ABNORMAL LOW (ref 4.0–10.5)

## 2018-09-16 LAB — CMP (CANCER CENTER ONLY)
ALT: 18 U/L (ref 10–47)
AST: 28 U/L (ref 11–38)
Albumin: 2.9 g/dL — ABNORMAL LOW (ref 3.5–5.0)
Alkaline Phosphatase: 41 U/L (ref 26–84)
Anion gap: 7 (ref 5–15)
BILIRUBIN TOTAL: 0.6 mg/dL (ref 0.2–1.6)
BUN: 14 mg/dL (ref 7–22)
CO2: 27 mmol/L (ref 18–33)
Calcium: 9 mg/dL (ref 8.0–10.3)
Chloride: 110 mmol/L — ABNORMAL HIGH (ref 98–108)
Creatinine: 0.5 mg/dL — ABNORMAL LOW (ref 0.60–1.20)
GLUCOSE: 79 mg/dL (ref 73–118)
POTASSIUM: 3.4 mmol/L (ref 3.3–4.7)
Sodium: 144 mmol/L (ref 128–145)
Total Protein: 7.9 g/dL (ref 6.4–8.1)

## 2018-09-16 MED ORDER — PROCHLORPERAZINE MALEATE 10 MG PO TABS
ORAL_TABLET | ORAL | Status: AC
  Filled 2018-09-16: qty 1

## 2018-09-16 MED ORDER — PROCHLORPERAZINE MALEATE 10 MG PO TABS
10.0000 mg | ORAL_TABLET | Freq: Once | ORAL | Status: AC
  Administered 2018-09-16: 10 mg via ORAL

## 2018-09-16 MED ORDER — BORTEZOMIB CHEMO SQ INJECTION 3.5 MG (2.5MG/ML)
1.3000 mg/m2 | Freq: Once | INTRAMUSCULAR | Status: AC
  Administered 2018-09-16: 2.5 mg via SUBCUTANEOUS
  Filled 2018-09-16: qty 1

## 2018-09-16 NOTE — Patient Instructions (Signed)
[facility] Discharge Instructions for Patients Receiving Chemotherapy  Today you received the following chemotherapy agents Velcade To help prevent nausea and vomiting after your treatment, we encourage you to take your nausea medication as prescribed.   If you develop nausea and vomiting that is not controlled by your nausea medication, call the clinic.   BELOW ARE SYMPTOMS THAT SHOULD BE REPORTED IMMEDIATELY:  *FEVER GREATER THAN 100.5 F  *CHILLS WITH OR WITHOUT FEVER  NAUSEA AND VOMITING THAT IS NOT CONTROLLED WITH YOUR NAUSEA MEDICATION  *UNUSUAL SHORTNESS OF BREATH  *UNUSUAL BRUISING OR BLEEDING  TENDERNESS IN MOUTH AND THROAT WITH OR WITHOUT PRESENCE OF ULCERS  *URINARY PROBLEMS  *BOWEL PROBLEMS  UNUSUAL RASH Items with * indicate a potential emergency and should be followed up as soon as possible.  Feel free to call the clinic should you have any questions or concerns. The clinic phone number is [phone].  Please show the CHEMO ALERT CARD at check-in to the Emergency Department and triage nurse.   

## 2018-09-16 NOTE — Progress Notes (Signed)
Ok to treat with ANC 0.9 per Dr. Ennever.  

## 2018-09-19 ENCOUNTER — Other Ambulatory Visit (INDEPENDENT_AMBULATORY_CARE_PROVIDER_SITE_OTHER): Payer: Medicare Other

## 2018-09-19 DIAGNOSIS — D729 Disorder of white blood cells, unspecified: Secondary | ICD-10-CM

## 2018-09-20 ENCOUNTER — Telehealth: Payer: Self-pay | Admitting: Family Medicine

## 2018-09-20 ENCOUNTER — Other Ambulatory Visit: Payer: Self-pay

## 2018-09-20 DIAGNOSIS — E876 Hypokalemia: Secondary | ICD-10-CM

## 2018-09-20 LAB — CBC WITH DIFFERENTIAL/PLATELET
BASOS PCT: 1.2 % (ref 0.0–3.0)
Basophils Absolute: 0 10*3/uL (ref 0.0–0.1)
EOS ABS: 0.2 10*3/uL (ref 0.0–0.7)
Eosinophils Relative: 6.4 % — ABNORMAL HIGH (ref 0.0–5.0)
HCT: 32.4 % — ABNORMAL LOW (ref 36.0–46.0)
HEMOGLOBIN: 10.7 g/dL — AB (ref 12.0–15.0)
LYMPHS PCT: 40.7 % (ref 12.0–46.0)
Lymphs Abs: 1 10*3/uL (ref 0.7–4.0)
MCHC: 32.9 g/dL (ref 30.0–36.0)
MCV: 87.3 fl (ref 78.0–100.0)
MONO ABS: 0.3 10*3/uL (ref 0.1–1.0)
Monocytes Relative: 13.9 % — ABNORMAL HIGH (ref 3.0–12.0)
Neutro Abs: 0.9 10*3/uL — ABNORMAL LOW (ref 1.4–7.7)
Neutrophils Relative %: 37.8 % — ABNORMAL LOW (ref 43.0–77.0)
Platelets: 101 10*3/uL — ABNORMAL LOW (ref 150.0–400.0)
RBC: 3.71 Mil/uL — AB (ref 3.87–5.11)
RDW: 17.8 % — AB (ref 11.5–15.5)

## 2018-09-20 LAB — COMPREHENSIVE METABOLIC PANEL
ALBUMIN: 3.6 g/dL (ref 3.5–5.2)
ALT: 11 U/L (ref 0–35)
AST: 19 U/L (ref 0–37)
Alkaline Phosphatase: 40 U/L (ref 39–117)
BILIRUBIN TOTAL: 0.4 mg/dL (ref 0.2–1.2)
BUN: 19 mg/dL (ref 6–23)
CHLORIDE: 106 meq/L (ref 96–112)
CO2: 26 mEq/L (ref 19–32)
Calcium: 8.8 mg/dL (ref 8.4–10.5)
Creatinine, Ser: 0.72 mg/dL (ref 0.40–1.20)
GFR: 104.78 mL/min (ref >60.00)
Glucose, Bld: 95 mg/dL (ref 70–99)
Potassium: 3.2 mEq/L — ABNORMAL LOW (ref 3.5–5.1)
SODIUM: 141 meq/L (ref 135–145)
Total Protein: 7.9 g/dL (ref 6.0–8.3)

## 2018-09-20 LAB — HIV ANTIBODY (ROUTINE TESTING W REFLEX): HIV: NONREACTIVE

## 2018-09-20 MED ORDER — POTASSIUM CHLORIDE CRYS ER 20 MEQ PO TBCR: 40.0000 meq | EXTENDED_RELEASE_TABLET | Freq: Once | ORAL | 3 refills | Status: DC

## 2018-09-20 NOTE — Telephone Encounter (Unsigned)
Copied from [place] [id number]. Topic: General - Other >> Sep 20, 2018  5:04 PM Yvette Rack wrote: Reason for CRM: Pt returned call for lab results. Pt requests call back. Cb# [phone]

## 2018-09-20 NOTE — Telephone Encounter (Signed)
Call placed to patient. Left VM to call office for lab results

## 2018-09-20 NOTE — Telephone Encounter (Signed)
Copied from [place] [id number]. Topic: Quick Communication - See Telephone Encounter >> Sep 20, 2018  5:20 PM Vernona Rieger wrote: CRM for notification. See Telephone encounter for: 09/20/18.  Sam with [facility] called and said the patient's directions for potassium chloride SA (K-DUR,KLOR-CON) 20 MEQ tablet are confusing. He said that if she is taking 2 tablets by mouth for one dosage then she has way to many pills on the prescription. Please contact pharmacy.

## 2018-09-20 NOTE — Telephone Encounter (Signed)
-----   Message from Jiles Prows, [place] sent at 09/20/2018  4:56 PM EST ----- Lm for patient to call back for results, ok for triage nurse to communicate results, advise of new dose of Potassium and schedule future CMP as lab appointment only. New prescription sent to her pharmacy and order for CMP entered as future.

## 2018-09-20 NOTE — Progress Notes (Signed)
[place]

## 2018-09-20 NOTE — Telephone Encounter (Signed)
CALLED PHARMACY FOR CLARIFICATION.

## 2018-09-22 DIAGNOSIS — J40 Bronchitis, not specified as acute or chronic: Secondary | ICD-10-CM | POA: Diagnosis not present

## 2018-09-23 ENCOUNTER — Inpatient Hospital Stay: Payer: Self-pay

## 2018-09-23 ENCOUNTER — Other Ambulatory Visit: Payer: Self-pay

## 2018-09-23 ENCOUNTER — Ambulatory Visit: Payer: Self-pay | Admitting: Family

## 2018-09-26 ENCOUNTER — Other Ambulatory Visit: Payer: Self-pay | Admitting: *Deleted

## 2018-09-26 DIAGNOSIS — C9000 Multiple myeloma not having achieved remission: Secondary | ICD-10-CM

## 2018-09-26 MED ORDER — LENALIDOMIDE 20 MG PO CAPS: ORAL_CAPSULE | ORAL | 0 refills | Status: DC

## 2018-09-30 ENCOUNTER — Inpatient Hospital Stay (HOSPITAL_BASED_OUTPATIENT_CLINIC_OR_DEPARTMENT_OTHER): Payer: Medicare Other | Admitting: Family

## 2018-09-30 ENCOUNTER — Other Ambulatory Visit: Payer: Self-pay | Admitting: Family

## 2018-09-30 ENCOUNTER — Inpatient Hospital Stay: Payer: Medicare Other

## 2018-09-30 VITALS — BP 129/71 | HR 73 | Temp 98.1°F | Resp 18 | Ht 67.0 in | Wt 179.0 lb

## 2018-09-30 DIAGNOSIS — C9000 Multiple myeloma not having achieved remission: Secondary | ICD-10-CM

## 2018-09-30 DIAGNOSIS — R197 Diarrhea, unspecified: Secondary | ICD-10-CM | POA: Diagnosis not present

## 2018-09-30 DIAGNOSIS — Z5112 Encounter for antineoplastic immunotherapy: Secondary | ICD-10-CM | POA: Diagnosis not present

## 2018-09-30 DIAGNOSIS — Z79899 Other long term (current) drug therapy: Secondary | ICD-10-CM | POA: Diagnosis not present

## 2018-09-30 LAB — CMP (CANCER CENTER ONLY)
ALBUMIN: 3 g/dL — AB (ref 3.5–5.0)
ALK PHOS: 42 U/L (ref 26–84)
ALT: 22 U/L (ref 10–47)
AST: 28 U/L (ref 11–38)
Anion gap: 7 (ref 5–15)
BUN: 16 mg/dL (ref 7–22)
CHLORIDE: 109 mmol/L — AB (ref 98–108)
CO2: 27 mmol/L (ref 18–33)
CREATININE: 0.9 mg/dL (ref 0.60–1.20)
Calcium: 9.2 mg/dL (ref 8.0–10.3)
Glucose, Bld: 99 mg/dL (ref 73–118)
Potassium: 3.3 mmol/L (ref 3.3–4.7)
Sodium: 143 mmol/L (ref 128–145)
TOTAL PROTEIN: 8.5 g/dL — AB (ref 6.4–8.1)
Total Bilirubin: 0.7 mg/dL (ref 0.2–1.6)

## 2018-09-30 LAB — CBC WITH DIFFERENTIAL (CANCER CENTER ONLY)
ABS IMMATURE GRANULOCYTES: 0.01 10*3/uL (ref 0.00–0.07)
BASOS PCT: 2 %
Basophils Absolute: 0.1 10*3/uL (ref 0.0–0.1)
EOS PCT: 11 %
Eosinophils Absolute: 0.3 10*3/uL (ref 0.0–0.5)
HCT: 35.8 % — ABNORMAL LOW (ref 36.0–46.0)
Hemoglobin: 11 g/dL — ABNORMAL LOW (ref 12.0–15.0)
Immature Granulocytes: 0 %
LYMPHS PCT: 28 %
Lymphs Abs: 0.8 10*3/uL (ref 0.7–4.0)
MCH: 27.6 pg (ref 26.0–34.0)
MCHC: 30.7 g/dL (ref 30.0–36.0)
MCV: 89.7 fL (ref 80.0–100.0)
MONO ABS: 0.3 10*3/uL (ref 0.1–1.0)
MONOS PCT: 13 %
Neutro Abs: 1.2 10*3/uL — ABNORMAL LOW (ref 1.7–7.7)
Neutrophils Relative %: 46 %
PLATELETS: 137 10*3/uL — AB (ref 150–400)
RBC: 3.99 MIL/uL (ref 3.87–5.11)
RDW: 15.6 % — AB (ref 11.5–15.5)
WBC: 2.7 10*3/uL — AB (ref 4.0–10.5)
nRBC: 0 % (ref 0.0–0.2)

## 2018-09-30 MED ORDER — PROCHLORPERAZINE MALEATE 10 MG PO TABS
10.0000 mg | ORAL_TABLET | Freq: Once | ORAL | Status: AC
  Administered 2018-09-30: 10 mg via ORAL

## 2018-09-30 MED ORDER — PROCHLORPERAZINE MALEATE 10 MG PO TABS
ORAL_TABLET | ORAL | Status: AC
  Filled 2018-09-30: qty 1

## 2018-09-30 MED ORDER — BORTEZOMIB CHEMO SQ INJECTION 3.5 MG (2.5MG/ML)
1.3000 mg/m2 | Freq: Once | INTRAMUSCULAR | Status: AC
  Administered 2018-09-30: 2.5 mg via SUBCUTANEOUS
  Filled 2018-09-30: qty 1

## 2018-09-30 NOTE — Patient Instructions (Signed)
[facility] Discharge Instructions for Patients Receiving Chemotherapy  Today you received the following chemotherapy agents Velcade. To help prevent nausea and vomiting after your treatment, we encourage you to take your nausea medication as directed.  If you develop nausea and vomiting that is not controlled by your nausea medication, call the clinic.   BELOW ARE SYMPTOMS THAT SHOULD BE REPORTED IMMEDIATELY:  *FEVER GREATER THAN 100.5 F  *CHILLS WITH OR WITHOUT FEVER  NAUSEA AND VOMITING THAT IS NOT CONTROLLED WITH YOUR NAUSEA MEDICATION  *UNUSUAL SHORTNESS OF BREATH  *UNUSUAL BRUISING OR BLEEDING  TENDERNESS IN MOUTH AND THROAT WITH OR WITHOUT PRESENCE OF ULCERS  *URINARY PROBLEMS  *BOWEL PROBLEMS  UNUSUAL RASH Items with * indicate a potential emergency and should be followed up as soon as possible.  Feel free to call the clinic you have any questions or concerns. The clinic phone number is [phone].  Please show the CHEMO ALERT CARD at check-in to the Emergency Department and triage nurse.    

## 2018-09-30 NOTE — Progress Notes (Signed)
Ok to treat with [place] and lab results today per Judson Roch NP

## 2018-09-30 NOTE — Progress Notes (Signed)
Hematology and Oncology Follow Up Visit  Tami [name] [id number] [birth year] 62 y.o. 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 She is doing well but has had some mild diarrhea after eating since Monday. She states that this is tolerable and will try taking Imodium. She will let us know if this does not help.  Her M-spike in October was 2.1, IgG level 3,289 mg/dL and kappa light chain 5.11 mg/dL.  She will be having tooth numbers 23, 24, 25, 26, and 27 exracted with alveoloplasty by Dr. Enrique Sack in December. [name] is still on hold. No fever, chills, n/v, cough, rash, dizziness, SOB, chest pain, palpitations, abdominal pain or changes in bladder habits.  No episodes of bleeding, no bruising or petechiae. No lymphadenopathy noted on exam.  No swelling or tenderness in her extremities. The numbness and tingling in her hands is unchanged.  No falls or syncopal episodes.  She has maintained a good appetite and is staying well hydrated. Her weight is stable.   ECOG Performance Status: 1 - Symptomatic but completely ambulatory  Medications:  Allergies as of 09/30/2018      Reactions   Benazepril Anaphylaxis, Swelling   angioedema Throat and lip swelling   Codeine Nausea And Vomiting   Morphine    Redness and hives noted post IV admin on 07/05/17   Ondansetron Hcl    Redness and hives post IV admin on 07/05/17      Medication List        Accurate as of 09/30/18  8:45 AM. Always use your most recent med list.          acetaminophen 325 MG tablet Commonly known as:  TYLENOL Take 650 mg by mouth.   albuterol 108 (90 Base) MCG/ACT inhaler Commonly known as:  PROVENTIL HFA;VENTOLIN HFA Inhale 2 puffs into the lungs every 6 (six) hours as needed for wheezing or shortness of breath.   amLODipine 10  MG tablet Commonly known as:  NORVASC Take 1 tablet (10 mg total) by mouth daily.   aspirin EC 81 MG tablet Take 1 tablet (81 mg total) by mouth daily.   carvedilol 25 MG tablet Commonly known as:  COREG Take 1 tablet (25 mg total) by mouth 2 (two) times daily with a meal.   cyclobenzaprine 10 MG tablet Commonly known as:  FLEXERIL Take by mouth.   dexamethasone 4 MG tablet Commonly known as:  DECADRON Take 5 pills at one time with food ONCE a WEEK!!   dicyclomine 20 MG tablet Commonly known as:  BENTYL Take 1 tablet (20 mg total) by mouth 3 (three) times daily as needed for spasms.   famciclovir 250 MG tablet Commonly known as:  FAMVIR Take 1 tablet (250 mg total) by mouth daily.   famotidine 40 MG/5ML suspension Commonly known as:  PEPCID Take 5 mLs (40 mg total) by mouth at bedtime.   fluticasone 50 MCG/ACT nasal spray Commonly known as:  FLONASE Place 2 sprays into both nostrils daily.   HYDROcodone-acetaminophen 5-325 MG tablet Commonly known as:  NORCO/VICODIN Take 1 tablet by mouth every 4 (four) hours as needed.   hyoscyamine 0.125 MG SL tablet Commonly known as:  LEVSIN SL Place 1 tablet (0.125 mg total) under the tongue every 4 (four) hours as needed.   lenalidomide 20 MG capsule  Commonly known as:  REVLIMID Take 1 pill daily for 3 weeks on and 1 week off.  Take at bedtime Auth#[id number]   levocetirizine 5 MG tablet Commonly known as:  XYZAL Take 1 tablet (5 mg total) by mouth every evening.   lidocaine 5 % Commonly known as:  LIDODERM Place 1 patch onto the skin daily. Remove & Discard patch within 12 hours or as directed by MD   ondansetron 4 MG tablet Commonly known as:  ZOFRAN Take 1 tablet (4 mg total) by mouth every 8 (eight) hours as needed for nausea or vomiting.   pantoprazole 40 MG tablet Commonly known as:  PROTONIX Take 1 tablet (40 mg total) by mouth 2 (two) times daily.   potassium chloride SA 20 MEQ tablet Commonly known as:   K-DUR,KLOR-CON Take 1 tablet (20 mEq total) by mouth daily.   potassium chloride SA 20 MEQ tablet Commonly known as:  K-DUR,KLOR-CON Take 2 tablets (40 mEq total) by mouth once for 1 dose.   PREMARIN vaginal cream Generic drug:  conjugated estrogens Place vaginally.   prochlorperazine 10 MG tablet Commonly known as:  COMPAZINE Take 1 tablet (10 mg total) by mouth every 6 (six) hours as needed (Nausea or vomiting).   sucralfate 1 g tablet Commonly known as:  CARAFATE Take 1 tablet (1 g total) by mouth 4 (four) times daily -  with meals and at bedtime.   topiramate 25 MG tablet Commonly known as:  TOPAMAX Take 1 tablet (25 mg total) by mouth 2 (two) times daily.       Allergies:  Allergies  Allergen Reactions  . Benazepril Anaphylaxis and Swelling    angioedema Throat and lip swelling  . Codeine Nausea And Vomiting  . Morphine     Redness and hives noted post IV admin on 07/05/17  . Ondansetron Hcl     Redness and hives post IV admin on 07/05/17    Past Medical History, Surgical history, Social history, and Family History were reviewed and updated.  Review of Systems: All other 10 point review of systems is negative.   Physical Exam:  vitals were not taken for this visit.   Wt Readings from Last 3 Encounters:  09/02/18 173 lb (78.5 kg)  09/02/18 175 lb 9.6 oz (79.7 kg)  08/26/18 175 lb (79.4 kg)    Ocular: Sclerae unicteric, pupils equal, round and reactive to light Ear-nose-throat: Oropharynx clear, dentition fair Lymphatic: No cervical, supraclavicular or axillary adenopathy Lungs no rales or rhonchi, good excursion bilaterally Heart regular rate and rhythm, no murmur appreciated Abd soft, nontender, positive bowel sounds, no liver or spleen tip palpated on exam, no fluid wave  MSK no focal spinal tenderness, no joint edema Neuro: non-focal, well-oriented, appropriate affect Breasts: Deferred   Lab Results  Component Value Date   WBC 2.7 (L) 09/30/2018    HGB 11.0 (L) 09/30/2018   HCT 35.8 (L) 09/30/2018   MCV 89.7 09/30/2018   PLT 137 (L) 09/30/2018   Lab Results  Component Value Date   FERRITIN 169 09/16/2016   IRON 59 09/16/2016   TIBC 281 09/16/2016   UIBC 222 09/16/2016   IRONPCTSAT 21 09/16/2016   Lab Results  Component Value Date   RETICCTPCT 0.8 07/16/2016   RBC 3.99 09/30/2018   RETICCTABS 32,640 07/16/2016   Lab Results  Component Value Date   KPAFRELGTCHN 51.1 (H) 09/02/2018   LAMBDASER 13.3 09/02/2018   KAPLAMBRATIO 3.84 (H) 09/02/2018   Lab Results  Component Value Date  IGGSERUM 3,289 (H) 09/02/2018   IGGSERUM 3,746 (H) 09/02/2018   IGA 215 09/02/2018   IGA 255 09/02/2018   IGMSERUM 139 09/02/2018   IGMSERUM 162 09/02/2018   Lab Results  Component Value Date   TOTALPROTELP 8.6 (H) 09/02/2018   ALBUMINELP 3.4 09/02/2018   A1GS 0.3 09/02/2018   A2GS 0.9 09/02/2018   BETS 1.2 09/02/2018   BETA2SER 0.5 07/16/2016   GAMS 2.9 (H) 09/02/2018   MSPIKE 2.1 (H) 09/02/2018   SPEI SEE NOTE 07/16/2016     Chemistry      Component Value Date/Time   NA 141 09/19/2018 1552   NA 143 11/08/2017 1127   NA 140 12/31/2016 1000   K 3.2 (L) 09/19/2018 1552   K 4.0 11/08/2017 1127   K 3.6 12/31/2016 1000   CL 106 09/19/2018 1552   CL 107 11/08/2017 1127   CO2 26 09/19/2018 1552   CO2 25 11/08/2017 1127   CO2 24 12/31/2016 1000   BUN 19 09/19/2018 1552   BUN 19 11/08/2017 1127   BUN 22.3 12/31/2016 1000   CREATININE 0.72 09/19/2018 1552   CREATININE 0.50 (L) 09/16/2018 0839   CREATININE 0.9 11/08/2017 1127   CREATININE 0.8 12/31/2016 1000      Component Value Date/Time   CALCIUM 8.8 09/19/2018 1552   CALCIUM 9.9 11/08/2017 1127   CALCIUM 9.5 12/31/2016 1000   ALKPHOS 40 09/19/2018 1552   ALKPHOS 44 11/08/2017 1127   ALKPHOS 51 12/31/2016 1000   AST 19 09/19/2018 1552   AST 28 09/16/2018 0839   AST 28 12/31/2016 1000   ALT 11 09/19/2018 1552   ALT 18 09/16/2018 0839   ALT 36 11/08/2017 1127    ALT 16 12/31/2016 1000   BILITOT 0.4 09/19/2018 1552   BILITOT 0.6 09/16/2018 0839   BILITOT 0.46 12/31/2016 1000      Impression and Plan: Tami [name] is a very pleasant 62 yo African American female with IgG kappa MGUS with progression to symptomatic plasma cell myeloma.  She states that her diarrhea cleared up for a week or so but seems to have returned when she eats. She will try taking Imodium.  I went over her most recent myeloma studies as well as today's labs with Dr. Marin Olp and we will proceed with treatment as planned.  [name] remains on hold. She will be having several teeth extracted in December.  We will see her again next week.  She will contact our office with any questions or concerns. We can certainly see him sooner if need be.   Laverna Peace, NP 11/22/20198:45 AM

## 2018-10-03 ENCOUNTER — Other Ambulatory Visit (INDEPENDENT_AMBULATORY_CARE_PROVIDER_SITE_OTHER): Payer: Medicare Other

## 2018-10-03 ENCOUNTER — Other Ambulatory Visit: Payer: Medicare Other

## 2018-10-03 ENCOUNTER — Other Ambulatory Visit: Payer: Self-pay | Admitting: Family Medicine

## 2018-10-03 DIAGNOSIS — E876 Hypokalemia: Secondary | ICD-10-CM

## 2018-10-03 DIAGNOSIS — K921 Melena: Secondary | ICD-10-CM

## 2018-10-03 LAB — COMPREHENSIVE METABOLIC PANEL
ALT: 16 U/L (ref 0–35)
AST: 24 U/L (ref 0–37)
Albumin: 3.5 g/dL (ref 3.5–5.2)
Alkaline Phosphatase: 38 U/L — ABNORMAL LOW (ref 39–117)
BUN: 9 mg/dL (ref 6–23)
CALCIUM: 8.6 mg/dL (ref 8.4–10.5)
CHLORIDE: 105 meq/L (ref 96–112)
CO2: 27 meq/L (ref 19–32)
CREATININE: 0.6 mg/dL (ref 0.40–1.20)
GFR: 129.3 mL/min (ref >60.00)
Glucose, Bld: 88 mg/dL (ref 70–99)
Potassium: 3.2 mEq/L — ABNORMAL LOW (ref 3.5–5.1)
SODIUM: 140 meq/L (ref 135–145)
Total Bilirubin: 0.5 mg/dL (ref 0.2–1.2)
Total Protein: 7.7 g/dL (ref 6.0–8.3)

## 2018-10-03 NOTE — Addendum Note (Signed)
Addended by: Magdalene Molly A on: 10/03/2018 02:05 PM   Modules accepted: Orders

## 2018-10-04 ENCOUNTER — Other Ambulatory Visit (INDEPENDENT_AMBULATORY_CARE_PROVIDER_SITE_OTHER): Payer: Medicare Other

## 2018-10-04 DIAGNOSIS — K921 Melena: Secondary | ICD-10-CM

## 2018-10-05 LAB — CBC WITH DIFFERENTIAL/PLATELET
BASOS ABS: 0 10*3/uL (ref 0.0–0.1)
Basophils Relative: 1.9 % (ref 0.0–3.0)
EOS PCT: 12.6 % — AB (ref 0.0–5.0)
Eosinophils Absolute: 0.3 10*3/uL (ref 0.0–0.7)
HEMATOCRIT: 34.8 % — AB (ref 36.0–46.0)
Hemoglobin: 11.4 g/dL — ABNORMAL LOW (ref 12.0–15.0)
LYMPHS PCT: 44.3 % (ref 12.0–46.0)
Lymphs Abs: 0.9 10*3/uL (ref 0.7–4.0)
MCHC: 32.7 g/dL (ref 30.0–36.0)
MCV: 86.2 fl (ref 78.0–100.0)
MONOS PCT: 12.9 % — AB (ref 3.0–12.0)
Monocytes Absolute: 0.3 10*3/uL (ref 0.1–1.0)
NEUTROS ABS: 0.6 10*3/uL — AB (ref 1.4–7.7)
Neutrophils Relative %: 28.3 % — ABNORMAL LOW (ref 43.0–77.0)
Platelets: 98 10*3/uL — ABNORMAL LOW (ref 150.0–400.0)
RBC: 4.04 Mil/uL (ref 3.87–5.11)
RDW: 17.6 % — AB (ref 11.5–15.5)
WBC: 2.1 10*3/uL — ABNORMAL LOW (ref 4.0–10.5)

## 2018-10-05 LAB — COMPREHENSIVE METABOLIC PANEL
ALBUMIN: 3.6 g/dL (ref 3.5–5.2)
ALT: 19 U/L (ref 0–35)
AST: 31 U/L (ref 0–37)
Alkaline Phosphatase: 36 U/L — ABNORMAL LOW (ref 39–117)
BUN: 10 mg/dL (ref 6–23)
CALCIUM: 9 mg/dL (ref 8.4–10.5)
CHLORIDE: 104 meq/L (ref 96–112)
CO2: 29 mEq/L (ref 19–32)
CREATININE: 0.6 mg/dL (ref 0.40–1.20)
GFR: 129.29 mL/min (ref >60.00)
Glucose, Bld: 94 mg/dL (ref 70–99)
Potassium: 3 mEq/L — ABNORMAL LOW (ref 3.5–5.1)
Sodium: 140 mEq/L (ref 135–145)
Total Bilirubin: 0.5 mg/dL (ref 0.2–1.2)
Total Protein: 7.7 g/dL (ref 6.0–8.3)

## 2018-10-07 ENCOUNTER — Inpatient Hospital Stay: Payer: Medicare Other

## 2018-10-07 ENCOUNTER — Inpatient Hospital Stay (HOSPITAL_BASED_OUTPATIENT_CLINIC_OR_DEPARTMENT_OTHER): Payer: Medicare Other | Admitting: Family

## 2018-10-07 ENCOUNTER — Other Ambulatory Visit: Payer: Self-pay

## 2018-10-07 ENCOUNTER — Encounter: Payer: Self-pay | Admitting: Family

## 2018-10-07 ENCOUNTER — Telehealth: Payer: Self-pay | Admitting: Family

## 2018-10-07 VITALS — BP 147/94 | HR 68 | Temp 98.0°F | Wt 179.4 lb

## 2018-10-07 DIAGNOSIS — R197 Diarrhea, unspecified: Secondary | ICD-10-CM | POA: Diagnosis not present

## 2018-10-07 DIAGNOSIS — C9000 Multiple myeloma not having achieved remission: Secondary | ICD-10-CM

## 2018-10-07 DIAGNOSIS — R748 Abnormal levels of other serum enzymes: Secondary | ICD-10-CM

## 2018-10-07 DIAGNOSIS — Z5112 Encounter for antineoplastic immunotherapy: Secondary | ICD-10-CM | POA: Diagnosis not present

## 2018-10-07 DIAGNOSIS — Z79899 Other long term (current) drug therapy: Secondary | ICD-10-CM | POA: Diagnosis not present

## 2018-10-07 LAB — CMP (CANCER CENTER ONLY)
ALBUMIN: 3.6 g/dL (ref 3.5–5.0)
ALT: 42 U/L (ref 0–44)
ANION GAP: 8 (ref 5–15)
AST: 117 U/L — ABNORMAL HIGH (ref 15–41)
Alkaline Phosphatase: 36 U/L — ABNORMAL LOW (ref 38–126)
BILIRUBIN TOTAL: 0.5 mg/dL (ref 0.3–1.2)
BUN: 12 mg/dL (ref 8–23)
CO2: 27 mmol/L (ref 22–32)
Calcium: 8.8 mg/dL — ABNORMAL LOW (ref 8.9–10.3)
Chloride: 107 mmol/L (ref 98–111)
Creatinine: 0.65 mg/dL (ref 0.44–1.00)
GFR, Est AFR Am: >60 mL/min (ref >60)
Glucose, Bld: 98 mg/dL (ref 70–99)
POTASSIUM: 3.4 mmol/L — AB (ref 3.5–5.1)
Sodium: 142 mmol/L (ref 135–145)
TOTAL PROTEIN: 7.6 g/dL (ref 6.5–8.1)

## 2018-10-07 LAB — CBC WITH DIFFERENTIAL (CANCER CENTER ONLY)
Abs Immature Granulocytes: 0 10*3/uL (ref 0.00–0.07)
BASOS ABS: 0 10*3/uL (ref 0.0–0.1)
Basophils Relative: 1 %
EOS PCT: 7 %
Eosinophils Absolute: 0.2 10*3/uL (ref 0.0–0.5)
HCT: 34.8 % — ABNORMAL LOW (ref 36.0–46.0)
Hemoglobin: 10.8 g/dL — ABNORMAL LOW (ref 12.0–15.0)
Immature Granulocytes: 0 %
LYMPHS ABS: 0.8 10*3/uL (ref 0.7–4.0)
Lymphocytes Relative: 40 %
MCH: 27.4 pg (ref 26.0–34.0)
MCHC: 31 g/dL (ref 30.0–36.0)
MCV: 88.3 fL (ref 80.0–100.0)
Monocytes Absolute: 0.2 10*3/uL (ref 0.1–1.0)
Monocytes Relative: 12 %
Neutro Abs: 0.8 10*3/uL — ABNORMAL LOW (ref 1.7–7.7)
Neutrophils Relative %: 40 %
Platelet Count: 115 10*3/uL — ABNORMAL LOW (ref 150–400)
RBC: 3.94 MIL/uL (ref 3.87–5.11)
RDW: 15.7 % — ABNORMAL HIGH (ref 11.5–15.5)
WBC Count: 2.1 10*3/uL — ABNORMAL LOW (ref 4.0–10.5)
nRBC: 0 % (ref 0.0–0.2)

## 2018-10-07 LAB — LACTATE DEHYDROGENASE: LDH: 412 U/L — ABNORMAL HIGH (ref 98–192)

## 2018-10-07 MED ORDER — BORTEZOMIB CHEMO SQ INJECTION 3.5 MG (2.5MG/ML)
1.3000 mg/m2 | Freq: Once | INTRAMUSCULAR | Status: AC
  Administered 2018-10-07: 2.5 mg via SUBCUTANEOUS
  Filled 2018-10-07: qty 1

## 2018-10-07 MED ORDER — PROCHLORPERAZINE MALEATE 10 MG PO TABS
ORAL_TABLET | ORAL | Status: AC
  Filled 2018-10-07: qty 1

## 2018-10-07 MED ORDER — PROCHLORPERAZINE MALEATE 10 MG PO TABS
10.0000 mg | ORAL_TABLET | Freq: Once | ORAL | Status: AC
  Administered 2018-10-07: 10 mg via ORAL

## 2018-10-07 NOTE — Telephone Encounter (Signed)
Appts scheduled avs/calendar printed per 11/29 los

## 2018-10-07 NOTE — Progress Notes (Signed)
Hematology and Oncology Follow Up Visit  Clarrisa Kaylor [id number] December 12, 1955 62 y.o. 10/07/2018   Principle Diagnosis:  IgG Kappa MGUS - progression tosymptomatic plasma cellmyeloma  Current Therapy:   RVD - s/pcycle 7   Interim History: Ms. Kercheval is here today for follow-up and Velcade. She is feeling much better and her diarrhea has resolved with Imodium.  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 They did have a great time together.  No fever, chills, n/v, cough, rash, dizziness, SOB, chest pain, palpitations, abdominal pain or changes in bladder habits.  No swelling, tenderness, numbness or tingling in her extremities.  No c/o pain at this time.  No lymphadenopathy noted on exam.  She is eating well and staying well hydrated. Her weight is stable.   ECOG Performance Status: 1 - Symptomatic but completely ambulatory  Medications:  Allergies as of 10/07/2018      Reactions   Benazepril Anaphylaxis, Swelling   angioedema Throat and lip swelling   Ondansetron Hcl Hives   Redness and hives post IV admin on 07/05/17   Codeine Nausea And Vomiting   Morphine Hives   Redness and hives noted post IV admin on 07/05/17      Medication List        Accurate as of 10/07/18  9:42 AM. Always use your most recent med list.          acetaminophen 325 MG tablet Commonly known as:  TYLENOL Take 650 mg by mouth.   albuterol 108 (90 Base) MCG/ACT inhaler Commonly known as:  PROVENTIL HFA;VENTOLIN HFA Inhale 2 puffs into the lungs every 6 (six) hours as needed for wheezing or shortness of breath.   amLODipine 10 MG tablet Commonly known as:  NORVASC Take 1 tablet (10 mg total) by mouth daily.   aspirin EC 81 MG tablet Take 1 tablet (81 mg total) by mouth daily.   carvedilol 25 MG tablet Commonly known as:  COREG Take 1 tablet (25 mg total) by mouth 2 (two) times daily with a meal.   cyclobenzaprine 10 MG  tablet Commonly known as:  FLEXERIL Take by mouth.   dexamethasone 4 MG tablet Commonly known as:  DECADRON Take 5 pills at one time with food ONCE a WEEK!!   dicyclomine 20 MG tablet Commonly known as:  BENTYL Take 1 tablet (20 mg total) by mouth 3 (three) times daily as needed for spasms.   famciclovir 250 MG tablet Commonly known as:  FAMVIR Take 1 tablet (250 mg total) by mouth daily.   famotidine 40 MG/5ML suspension Commonly known as:  PEPCID Take 5 mLs (40 mg total) by mouth at bedtime.   fluticasone 50 MCG/ACT nasal spray Commonly known as:  FLONASE Place 2 sprays into both nostrils daily.   HYDROcodone-acetaminophen 5-325 MG tablet Commonly known as:  NORCO/VICODIN Take 1 tablet by mouth every 4 (four) hours as needed.   hyoscyamine 0.125 MG SL tablet Commonly known as:  LEVSIN SL Place 1 tablet (0.125 mg total) under the tongue every 4 (four) hours as needed.   lenalidomide 20 MG capsule Commonly known as:  REVLIMID Take 1 pill daily for 3 weeks on and 1 week off.  Take at bedtime Auth#[id number]   levocetirizine 5 MG tablet Commonly known as:  XYZAL Take 1 tablet (5 mg total) by mouth every evening.   lidocaine 5 % Commonly known as:  LIDODERM Place 1 patch onto the skin daily. Remove &  Discard patch within 12 hours or as directed by MD   ondansetron 4 MG tablet Commonly known as:  ZOFRAN Take 1 tablet (4 mg total) by mouth every 8 (eight) hours as needed for nausea or vomiting.   pantoprazole 40 MG tablet Commonly known as:  PROTONIX Take 1 tablet (40 mg total) by mouth 2 (two) times daily.   potassium chloride SA 20 MEQ tablet Commonly known as:  K-DUR,KLOR-CON Take 1 tablet (20 mEq total) by mouth daily.   potassium chloride SA 20 MEQ tablet Commonly known as:  K-DUR,KLOR-CON Take 2 tablets (40 mEq total) by mouth once for 1 dose.   PREMARIN vaginal cream Generic drug:  conjugated estrogens Place vaginally.   prochlorperazine 10 MG  tablet Commonly known as:  COMPAZINE Take 1 tablet (10 mg total) by mouth every 6 (six) hours as needed (Nausea or vomiting).   sucralfate 1 g tablet Commonly known as:  CARAFATE Take 1 tablet (1 g total) by mouth 4 (four) times daily -  with meals and at bedtime.   topiramate 25 MG tablet Commonly known as:  TOPAMAX Take 1 tablet (25 mg total) by mouth 2 (two) times daily.       Allergies:  Allergies  Allergen Reactions  . Benazepril Anaphylaxis and Swelling    angioedema Throat and lip swelling  . Ondansetron Hcl Hives    Redness and hives post IV admin on 07/05/17  . Codeine Nausea And Vomiting  . Morphine Hives    Redness and hives noted post IV admin on 07/05/17    Past Medical History, Surgical history, Social history, and Family History were reviewed and updated.  Review of Systems: All other 10 point review of systems is negative.   Physical Exam:  weight is 179 lb 6.4 oz (81.4 kg). Her oral temperature is 98 F (36.7 C). Her blood pressure is 147/94 (abnormal) and her pulse is 68. Her oxygen saturation is 100%.   Wt Readings from Last 3 Encounters:  10/07/18 179 lb 6.4 oz (81.4 kg)  09/30/18 179 lb (81.2 kg)  09/02/18 173 lb (78.5 kg)    Ocular: Sclerae unicteric, pupils equal, round and reactive to light Ear-nose-throat: Oropharynx clear, dentition fair Lymphatic: No cervical, supraclavicular or axillary adenopathy Lungs no rales or rhonchi, good excursion bilaterally Heart regular rate and rhythm, no murmur appreciated Abd soft, nontender, positive bowel sounds, no liver or spleen tip palpated on exam, no fluid wave  MSK no focal spinal tenderness, no joint edema Neuro: non-focal, well-oriented, appropriate affect Breasts: Deferred   Lab Results  Component Value Date   WBC 2.1 (L) 10/07/2018   HGB 10.8 (L) 10/07/2018   HCT 34.8 (L) 10/07/2018   MCV 88.3 10/07/2018   PLT 115 (L) 10/07/2018   Lab Results  Component Value Date   FERRITIN 169  09/16/2016   IRON 59 09/16/2016   TIBC 281 09/16/2016   UIBC 222 09/16/2016   IRONPCTSAT 21 09/16/2016   Lab Results  Component Value Date   RETICCTPCT 0.8 07/16/2016   RBC 3.94 10/07/2018   RETICCTABS 32,640 07/16/2016   Lab Results  Component Value Date   KPAFRELGTCHN 51.1 (H) 09/02/2018   LAMBDASER 13.3 09/02/2018   KAPLAMBRATIO 3.84 (H) 09/02/2018   Lab Results  Component Value Date   IGGSERUM 3,289 (H) 09/02/2018   IGGSERUM 3,746 (H) 09/02/2018   IGA 215 09/02/2018   IGA 255 09/02/2018   IGMSERUM 139 09/02/2018   IGMSERUM 162 09/02/2018   Lab Results  Component Value Date   TOTALPROTELP 8.6 (H) 09/02/2018   ALBUMINELP 3.4 09/02/2018   A1GS 0.3 09/02/2018   A2GS 0.9 09/02/2018   BETS 1.2 09/02/2018   BETA2SER 0.5 07/16/2016   GAMS 2.9 (H) 09/02/2018   MSPIKE 2.1 (H) 09/02/2018   SPEI SEE NOTE 07/16/2016     Chemistry      Component Value Date/Time   NA 142 10/07/2018 0837   NA 143 11/08/2017 1127   NA 140 12/31/2016 1000   K 3.4 (L) 10/07/2018 0837   K 4.0 11/08/2017 1127   K 3.6 12/31/2016 1000   CL 107 10/07/2018 0837   CL 107 11/08/2017 1127   CO2 27 10/07/2018 0837   CO2 25 11/08/2017 1127   CO2 24 12/31/2016 1000   BUN 12 10/07/2018 0837   BUN 19 11/08/2017 1127   BUN 22.3 12/31/2016 1000   CREATININE 0.65 10/07/2018 0837   CREATININE 0.9 11/08/2017 1127   CREATININE 0.8 12/31/2016 1000      Component Value Date/Time   CALCIUM 8.8 (L) 10/07/2018 0837   CALCIUM 9.9 11/08/2017 1127   CALCIUM 9.5 12/31/2016 1000   ALKPHOS 36 (L) 10/07/2018 0837   ALKPHOS 44 11/08/2017 1127   ALKPHOS 51 12/31/2016 1000   AST 117 (H) 10/07/2018 0837   AST 28 12/31/2016 1000   ALT 42 10/07/2018 0837   ALT 36 11/08/2017 1127   ALT 16 12/31/2016 1000   BILITOT 0.5 10/07/2018 0837   BILITOT 0.46 12/31/2016 1000      Impression and Plan: Ms. Eppes is a very pleasant 61 yo African American female with a previous IgG kappa MGUS with progression to Plasma cell  myeloma.  Her diarrhea has resolved.  She is worn out from all the Thanksgiving preparation and plans to rest this week.  She is currently on her Week off with Revlimid.  We will proceed with treatment today per Dr. Marin Olp. He is aware of the elevated AST. We will continue to follow.  She will see Dr. Marin Olp at her next visit.  She will contact our office with any questions or concerns. We can certainly see her sooner if need be.   Laverna Peace, NP 11/29/20199:42 AM

## 2018-10-07 NOTE — Progress Notes (Signed)
Reviewed labs with Laverna Peace NP.  Ok to treat

## 2018-10-07 NOTE — Patient Instructions (Signed)

## 2018-10-08 LAB — IGG, IGA, IGM
IGG (IMMUNOGLOBIN G), SERUM: 2397 mg/dL — AB (ref 700–1600)
IgA: 180 mg/dL (ref 87–352)
IgM (Immunoglobulin M), Srm: 100 mg/dL (ref 26–217)

## 2018-10-10 ENCOUNTER — Telehealth: Payer: Self-pay | Admitting: *Deleted

## 2018-10-10 LAB — PROTEIN ELECTROPHORESIS, SERUM
A/G Ratio: 0.9 (ref 0.7–1.7)
ALBUMIN ELP: 3.5 g/dL (ref 2.9–4.4)
ALPHA-1-GLOBULIN: 0.2 g/dL (ref 0.0–0.4)
ALPHA-2-GLOBULIN: 0.7 g/dL (ref 0.4–1.0)
Beta Globulin: 1 g/dL (ref 0.7–1.3)
GAMMA GLOBULIN: 2.1 g/dL — AB (ref 0.4–1.8)
GLOBULIN, TOTAL: 4.1 g/dL — AB (ref 2.2–3.9)
M-Spike, %: 1.3 g/dL — ABNORMAL HIGH
TOTAL PROTEIN ELP: 7.6 g/dL (ref 6.0–8.5)

## 2018-10-10 LAB — KAPPA/LAMBDA LIGHT CHAINS
KAPPA FREE LGHT CHN: 47 mg/L — AB (ref 3.3–19.4)
Kappa, lambda light chain ratio: 4.09 — ABNORMAL HIGH (ref 0.26–1.65)
LAMDA FREE LIGHT CHAINS: 11.5 mg/L (ref 5.7–26.3)

## 2018-10-10 NOTE — Telephone Encounter (Signed)
-----   Message from [name], MD sent at 10/10/2018  1:56 PM EST ----- Call - myeloma level is still coming down!!!  Laurey Arrow

## 2018-10-10 NOTE — Telephone Encounter (Signed)
As noted below by Dr. Marin Olp, I left a message informing her that her myeloma level is comind down. Instructed her to call the office if she had any questions or concerns.

## 2018-10-14 ENCOUNTER — Inpatient Hospital Stay: Payer: Medicare Other | Attending: Hematology & Oncology

## 2018-10-14 ENCOUNTER — Inpatient Hospital Stay: Payer: Medicare Other

## 2018-10-14 VITALS — BP 141/81 | HR 72 | Temp 98.1°F | Resp 18

## 2018-10-14 DIAGNOSIS — Z5112 Encounter for antineoplastic immunotherapy: Secondary | ICD-10-CM | POA: Diagnosis not present

## 2018-10-14 DIAGNOSIS — C9000 Multiple myeloma not having achieved remission: Secondary | ICD-10-CM | POA: Insufficient documentation

## 2018-10-14 LAB — CBC WITH DIFFERENTIAL (CANCER CENTER ONLY)
Abs Immature Granulocytes: 0.02 10*3/uL (ref 0.00–0.07)
Basophils Absolute: 0.1 10*3/uL (ref 0.0–0.1)
Basophils Relative: 3 %
Eosinophils Absolute: 0.1 10*3/uL (ref 0.0–0.5)
Eosinophils Relative: 4 %
HCT: 37.1 % (ref 36.0–46.0)
Hemoglobin: 11.2 g/dL — ABNORMAL LOW (ref 12.0–15.0)
Immature Granulocytes: 1 %
Lymphocytes Relative: 41 %
Lymphs Abs: 1 10*3/uL (ref 0.7–4.0)
MCH: 27.5 pg (ref 26.0–34.0)
MCHC: 30.2 g/dL (ref 30.0–36.0)
MCV: 90.9 fL (ref 80.0–100.0)
Monocytes Absolute: 0.4 10*3/uL (ref 0.1–1.0)
Monocytes Relative: 14 %
NEUTROS PCT: 37 %
Neutro Abs: 0.9 10*3/uL — ABNORMAL LOW (ref 1.7–7.7)
Platelet Count: 168 10*3/uL (ref 150–400)
RBC: 4.08 MIL/uL (ref 3.87–5.11)
RDW: 15.9 % — ABNORMAL HIGH (ref 11.5–15.5)
WBC: 2.5 10*3/uL — AB (ref 4.0–10.5)
nRBC: 0 % (ref 0.0–0.2)

## 2018-10-14 LAB — CMP (CANCER CENTER ONLY)
ALT: 25 U/L (ref 0–44)
AST: 27 U/L (ref 15–41)
Albumin: 3.7 g/dL (ref 3.5–5.0)
Alkaline Phosphatase: 41 U/L (ref 38–126)
Anion gap: 6 (ref 5–15)
BUN: 13 mg/dL (ref 8–23)
CO2: 27 mmol/L (ref 22–32)
Calcium: 8.9 mg/dL (ref 8.9–10.3)
Chloride: 108 mmol/L (ref 98–111)
Creatinine: 0.68 mg/dL (ref 0.44–1.00)
GFR, Est AFR Am: >60 mL/min (ref >60)
GFR, Estimated: >60 mL/min (ref >60)
Glucose, Bld: 101 mg/dL — ABNORMAL HIGH (ref 70–99)
Potassium: 3.7 mmol/L (ref 3.5–5.1)
Sodium: 141 mmol/L (ref 135–145)
Total Bilirubin: 0.5 mg/dL (ref 0.3–1.2)
Total Protein: 7.8 g/dL (ref 6.5–8.1)

## 2018-10-14 MED ORDER — BORTEZOMIB CHEMO SQ INJECTION 3.5 MG (2.5MG/ML)
1.3000 mg/m2 | Freq: Once | INTRAMUSCULAR | Status: AC
  Administered 2018-10-14: 2.5 mg via SUBCUTANEOUS
  Filled 2018-10-14: qty 1

## 2018-10-14 MED ORDER — PROCHLORPERAZINE MALEATE 10 MG PO TABS
10.0000 mg | ORAL_TABLET | Freq: Once | ORAL | Status: AC
  Administered 2018-10-14: 10 mg via ORAL

## 2018-10-14 MED ORDER — PROCHLORPERAZINE MALEATE 10 MG PO TABS
ORAL_TABLET | ORAL | Status: AC
  Filled 2018-10-14: qty 1

## 2018-10-14 NOTE — Progress Notes (Signed)
Ok to treat with [facility] today per MD Ennever 

## 2018-10-14 NOTE — Patient Instructions (Signed)
[facility] Discharge Instructions for Patients Receiving Chemotherapy  Today you received the following chemotherapy agents Velcade. To help prevent nausea and vomiting after your treatment, we encourage you to take your nausea medication as directed.  If you develop nausea and vomiting that is not controlled by your nausea medication, call the clinic.   BELOW ARE SYMPTOMS THAT SHOULD BE REPORTED IMMEDIATELY:  *FEVER GREATER THAN 100.5 F  *CHILLS WITH OR WITHOUT FEVER  NAUSEA AND VOMITING THAT IS NOT CONTROLLED WITH YOUR NAUSEA MEDICATION  *UNUSUAL SHORTNESS OF BREATH  *UNUSUAL BRUISING OR BLEEDING  TENDERNESS IN MOUTH AND THROAT WITH OR WITHOUT PRESENCE OF ULCERS  *URINARY PROBLEMS  *BOWEL PROBLEMS  UNUSUAL RASH Items with * indicate a potential emergency and should be followed up as soon as possible.  Feel free to call the clinic you have any questions or concerns. The clinic phone number is [phone].  Please show the CHEMO ALERT CARD at check-in to the Emergency Department and triage nurse.    

## 2018-10-17 ENCOUNTER — Encounter: Payer: Self-pay | Admitting: Family Medicine

## 2018-10-17 ENCOUNTER — Ambulatory Visit (INDEPENDENT_AMBULATORY_CARE_PROVIDER_SITE_OTHER): Payer: Medicare Other | Admitting: Family Medicine

## 2018-10-17 DIAGNOSIS — H109 Unspecified conjunctivitis: Secondary | ICD-10-CM | POA: Diagnosis not present

## 2018-10-17 DIAGNOSIS — R197 Diarrhea, unspecified: Secondary | ICD-10-CM

## 2018-10-17 DIAGNOSIS — C9000 Multiple myeloma not having achieved remission: Secondary | ICD-10-CM | POA: Diagnosis not present

## 2018-10-17 DIAGNOSIS — I10 Essential (primary) hypertension: Secondary | ICD-10-CM

## 2018-10-17 DIAGNOSIS — A0472 Enterocolitis due to Clostridium difficile, not specified as recurrent: Secondary | ICD-10-CM

## 2018-10-17 DIAGNOSIS — I509 Heart failure, unspecified: Secondary | ICD-10-CM

## 2018-10-17 MED ORDER — POLYMYXIN B-TRIMETHOPRIM 10000-0.1 UNIT/ML-% OP SOLN: 1.0000 [drp] | Freq: Three times a day (TID) | OPHTHALMIC | 0 refills | Status: DC | PRN

## 2018-10-17 MED ORDER — MUPIROCIN 2 % EX OINT: 1.0000 "application " | TOPICAL_OINTMENT | Freq: Two times a day (BID) | CUTANEOUS | 0 refills | Status: DC

## 2018-10-17 NOTE — Assessment & Plan Note (Signed)
Well controlled, no changes to meds. Encouraged heart healthy diet such as the DASH diet and exercise as tolerated.  °

## 2018-10-17 NOTE — Assessment & Plan Note (Addendum)
tolerating treatment shots but small local reaction this week. Treat with Witch Hazel Astringent and Mupirocin ointment and let oncology know if worse

## 2018-10-17 NOTE — Assessment & Plan Note (Signed)
polytrim tid and hot compresses. Seek care if worsens

## 2018-10-17 NOTE — Assessment & Plan Note (Signed)
Doing much better with only one episode that responded to Imodium

## 2018-10-17 NOTE — Patient Instructions (Signed)
Bacterial Conjunctivitis Bacterial conjunctivitis is an infection of your conjunctiva. This is the clear membrane that covers the white part of your eye and the inner surface of your eyelid. This condition can make your eye:  Red or pink.  Itchy.  This condition is caused by bacteria. This condition spreads very easily from person to person (is contagious) and from one eye to the other eye. Follow these instructions at home: Medicines  Take or apply your antibiotic medicine as told by your doctor. Do not stop taking or applying the antibiotic even if you start to feel better.  Take or apply over-the-counter and prescription medicines only as told by your doctor.  Do not touch your eyelid with the eye drop bottle or the ointment tube. Managing discomfort  Wipe any fluid from your eye with a warm, wet washcloth or a cotton ball.  Place a cool, clean washcloth on your eye. Do this for 10-20 minutes, 3-4 times per day. General instructions  Do not wear contact lenses until the irritation is gone. Wear glasses until your doctor says it is okay to wear contacts.  Do not wear eye makeup until your symptoms are gone. Throw away any old makeup.  Change or wash your pillowcase every day.  Do not share towels or washcloths with anyone.  Wash your hands often with soap and water. Use paper towels to dry your hands.  Do not touch or rub your eyes.  Do not drive or use heavy machinery if your vision is blurry. Contact a doctor if:  You have a fever.  Your symptoms do not get better after 10 days. Get help right away if:  You have a fever and your symptoms suddenly get worse.  You have very bad pain when you move your eye.  Your face: ? Hurts. ? Is red. ? Is swollen.  You have sudden loss of vision. This information is not intended to replace advice given to you by your health care provider. Make sure you discuss any questions you have with your health care provider. Document  Released: 08/04/2008 Document Revised: 04/02/2016 Document Reviewed: 08/08/2015 Elsevier Interactive Patient Education  2018 [employer]  

## 2018-10-19 DIAGNOSIS — R197 Diarrhea, unspecified: Secondary | ICD-10-CM | POA: Diagnosis not present

## 2018-10-19 DIAGNOSIS — K639 Disease of intestine, unspecified: Secondary | ICD-10-CM | POA: Diagnosis not present

## 2018-10-19 DIAGNOSIS — H01003 Unspecified blepharitis right eye, unspecified eyelid: Secondary | ICD-10-CM | POA: Diagnosis not present

## 2018-10-19 DIAGNOSIS — K648 Other hemorrhoids: Secondary | ICD-10-CM | POA: Diagnosis not present

## 2018-10-19 DIAGNOSIS — R194 Change in bowel habit: Secondary | ICD-10-CM | POA: Diagnosis not present

## 2018-10-19 DIAGNOSIS — H5789 Other specified disorders of eye and adnexa: Secondary | ICD-10-CM | POA: Diagnosis not present

## 2018-10-19 DIAGNOSIS — Z1211 Encounter for screening for malignant neoplasm of colon: Secondary | ICD-10-CM | POA: Diagnosis not present

## 2018-10-19 DIAGNOSIS — H01006 Unspecified blepharitis left eye, unspecified eyelid: Secondary | ICD-10-CM | POA: Diagnosis not present

## 2018-10-21 ENCOUNTER — Inpatient Hospital Stay (HOSPITAL_BASED_OUTPATIENT_CLINIC_OR_DEPARTMENT_OTHER): Payer: Medicare Other | Admitting: Hematology & Oncology

## 2018-10-21 ENCOUNTER — Other Ambulatory Visit: Payer: Self-pay

## 2018-10-21 ENCOUNTER — Telehealth: Payer: Self-pay | Admitting: Hematology & Oncology

## 2018-10-21 ENCOUNTER — Ambulatory Visit: Payer: Self-pay | Admitting: Family

## 2018-10-21 ENCOUNTER — Inpatient Hospital Stay: Payer: Medicare Other

## 2018-10-21 ENCOUNTER — Encounter: Payer: Self-pay | Admitting: Hematology & Oncology

## 2018-10-21 ENCOUNTER — Inpatient Hospital Stay: Payer: Self-pay

## 2018-10-21 VITALS — BP 129/70 | HR 76 | Temp 98.0°F | Resp 18 | Wt 178.0 lb

## 2018-10-21 DIAGNOSIS — C9000 Multiple myeloma not having achieved remission: Secondary | ICD-10-CM

## 2018-10-21 DIAGNOSIS — Z5112 Encounter for antineoplastic immunotherapy: Secondary | ICD-10-CM | POA: Diagnosis not present

## 2018-10-21 LAB — CMP (CANCER CENTER ONLY)
ALT: 15 U/L (ref 0–44)
AST: 18 U/L (ref 15–41)
Albumin: 3.7 g/dL (ref 3.5–5.0)
Alkaline Phosphatase: 38 U/L (ref 38–126)
Anion gap: 6 (ref 5–15)
BUN: 12 mg/dL (ref 8–23)
CALCIUM: 8.9 mg/dL (ref 8.9–10.3)
CO2: 29 mmol/L (ref 22–32)
Chloride: 101 mmol/L (ref 98–111)
Creatinine: 0.69 mg/dL (ref 0.44–1.00)
GFR, Est AFR Am: >60 mL/min (ref >60)
GFR, Estimated: >60 mL/min (ref >60)
Glucose, Bld: 86 mg/dL (ref 70–99)
Potassium: 3 mmol/L — ABNORMAL LOW (ref 3.5–5.1)
Sodium: 136 mmol/L (ref 135–145)
Total Bilirubin: 0.6 mg/dL (ref 0.3–1.2)
Total Protein: 7.6 g/dL (ref 6.5–8.1)

## 2018-10-21 LAB — CBC WITH DIFFERENTIAL (CANCER CENTER ONLY)
Abs Immature Granulocytes: 0.01 10*3/uL (ref 0.00–0.07)
Basophils Absolute: 0 10*3/uL (ref 0.0–0.1)
Basophils Relative: 2 %
Eosinophils Absolute: 0.2 10*3/uL (ref 0.0–0.5)
Eosinophils Relative: 6 %
HCT: 35 % — ABNORMAL LOW (ref 36.0–46.0)
HEMOGLOBIN: 10.9 g/dL — AB (ref 12.0–15.0)
Immature Granulocytes: 0 %
LYMPHS PCT: 40 %
Lymphs Abs: 1.1 10*3/uL (ref 0.7–4.0)
MCH: 27.9 pg (ref 26.0–34.0)
MCHC: 31.1 g/dL (ref 30.0–36.0)
MCV: 89.7 fL (ref 80.0–100.0)
Monocytes Absolute: 0.2 10*3/uL (ref 0.1–1.0)
Monocytes Relative: 7 %
Neutro Abs: 1.2 10*3/uL — ABNORMAL LOW (ref 1.7–7.7)
Neutrophils Relative %: 45 %
Platelet Count: 135 10*3/uL — ABNORMAL LOW (ref 150–400)
RBC: 3.9 MIL/uL (ref 3.87–5.11)
RDW: 16 % — ABNORMAL HIGH (ref 11.5–15.5)
WBC Count: 2.7 10*3/uL — ABNORMAL LOW (ref 4.0–10.5)
nRBC: 0 % (ref 0.0–0.2)

## 2018-10-21 LAB — LACTATE DEHYDROGENASE: LDH: 243 U/L — ABNORMAL HIGH (ref 98–192)

## 2018-10-21 MED ORDER — POTASSIUM CHLORIDE CRYS ER 20 MEQ PO TBCR
40.0000 meq | EXTENDED_RELEASE_TABLET | Freq: Once | ORAL | Status: AC
  Administered 2018-10-21: 40 meq via ORAL
  Filled 2018-10-21: qty 2

## 2018-10-21 NOTE — Progress Notes (Signed)
Hematology and Oncology Follow Up Visit  Tami [name] [id number] 1955/11/22 62 y.o. 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 is here today for follow-up.  She actually is here 1 week early for treatment.  As such, we will not treat her today.  I am not sure how she was scheduled for today.  Her myeloma studies are improving nicely.  Her M spike on 10/07/2018 was down to 1.3 g/dL.  Her IgG level was 2400 mg/dL.  Her kappa light chain was 4.7 mg/dL.  She is having some reaction where she is getting the Velcade injections.  She was put on some Bactroban ointment by her family doctor.  She is had no nausea or vomiting.  She has had no diarrhea.  There is been no fever.  She has had no cough.  She has had no leg swelling.  There is been no urinary issues.  Overall, her performance status is ECOG 1.  Medications:  Allergies as of 10/21/2018      Reactions   Benazepril Anaphylaxis, Swelling   angioedema Throat and lip swelling   Ondansetron Hcl Hives   Redness and hives post IV admin on 07/05/17   Codeine Nausea And Vomiting   Morphine Hives   Redness and hives noted post IV admin on 07/05/17      Medication List       Accurate as of October 21, 2018  9:54 AM. Always use your most recent med list.        acetaminophen 325 MG tablet Commonly known as:  TYLENOL Take 650 mg by mouth.   albuterol 108 (90 Base) MCG/ACT inhaler Commonly known as:  PROVENTIL HFA;VENTOLIN HFA Inhale 2 puffs into the lungs every 6 (six) hours as needed for wheezing or shortness of breath.   amLODipine 10 MG tablet Commonly known as:  NORVASC Take 1 tablet (10 mg total) by mouth daily.   aspirin EC 81 MG tablet Take 1 tablet (81 mg total) by mouth daily.   carvedilol 25 MG tablet Commonly known as:  COREG Take 1 tablet (25 mg total) by mouth 2 (two) times daily  with a meal.   cyclobenzaprine 10 MG tablet Commonly known as:  FLEXERIL Take by mouth.   dexamethasone 4 MG tablet Commonly known as:  DECADRON Take 5 pills at one time with food ONCE a WEEK!!   dicyclomine 20 MG tablet Commonly known as:  BENTYL Take 1 tablet (20 mg total) by mouth 3 (three) times daily as needed for spasms.   famciclovir 250 MG tablet Commonly known as:  FAMVIR Take 1 tablet (250 mg total) by mouth daily.   famotidine 40 MG/5ML suspension Commonly known as:  PEPCID Take 5 mLs (40 mg total) by mouth at bedtime.   fluticasone 50 MCG/ACT nasal spray Commonly known as:  FLONASE Place 2 sprays into both nostrils daily.   HYDROcodone-acetaminophen 5-325 MG tablet Commonly known as:  NORCO/VICODIN Take 1 tablet by mouth every 4 (four) hours as needed.   hyoscyamine 0.125 MG SL tablet Commonly known as:  LEVSIN SL Place 1 tablet (0.125 mg total) under the tongue every 4 (four) hours as needed.   lenalidomide 20 MG capsule Commonly known as:  REVLIMID Take 1 pill daily for 3 weeks on and 1 week off.  Take at bedtime Auth#[id number]   levocetirizine 5 MG tablet Commonly known as:  XYZAL Take 1 tablet (5 mg total) by mouth every evening.   lidocaine 5 % Commonly known as:  LIDODERM Place 1 patch onto the skin daily. Remove & Discard patch within 12 hours or as directed by MD   mupirocin ointment 2 % Commonly known as:  BACTROBAN Apply 1 application topically 2 (two) times daily.   ondansetron 4 MG tablet Commonly known as:  ZOFRAN Take 1 tablet (4 mg total) by mouth every 8 (eight) hours as needed for nausea or vomiting.   pantoprazole 40 MG tablet Commonly known as:  PROTONIX Take 1 tablet (40 mg total) by mouth 2 (two) times daily.   potassium chloride SA 20 MEQ tablet Commonly known as:  KLOR-CON M20 Take 1 tablet (20 mEq total) by mouth daily.   potassium chloride SA 20 MEQ tablet Commonly known as:  K-DUR,KLOR-CON Take 2 tablets (40 mEq  total) by mouth once for 1 dose.   PREMARIN vaginal cream Generic drug:  conjugated estrogens Place vaginally.   prochlorperazine 10 MG tablet Commonly known as:  COMPAZINE Take 1 tablet (10 mg total) by mouth every 6 (six) hours as needed (Nausea or vomiting).   sucralfate 1 g tablet Commonly known as:  CARAFATE Take 1 tablet (1 g total) by mouth 4 (four) times daily -  with meals and at bedtime.   topiramate 25 MG tablet Commonly known as:  TOPAMAX Take 1 tablet (25 mg total) by mouth 2 (two) times daily.   trimethoprim-polymyxin b ophthalmic solution Commonly known as:  POLYTRIM Place 1 drop into both eyes 3 (three) times daily as needed.       Allergies:  Allergies  Allergen Reactions  . Benazepril Anaphylaxis and Swelling    angioedema Throat and lip swelling  . Ondansetron Hcl Hives    Redness and hives post IV admin on 07/05/17  . Codeine Nausea And Vomiting  . Morphine Hives    Redness and hives noted post IV admin on 07/05/17    Past Medical History, Surgical history, Social history, and Family History were reviewed and updated.  Review of Systems: Review of Systems  Constitutional: Negative.   HENT: Negative.   Eyes: Negative.   Respiratory: Negative.   Cardiovascular: Negative.   Gastrointestinal: Negative.   Genitourinary: Negative.   Musculoskeletal: Negative.   Skin: Positive for rash.  Neurological: Negative.   Endo/Heme/Allergies: Negative.   Psychiatric/Behavioral: Negative.      Physical Exam:  weight is 178 lb (80.7 kg). Her oral temperature is 98 F (36.7 C). Her blood pressure is 129/70 and her pulse is 76. Her respiration is 18 and oxygen saturation is 99%.   Wt Readings from Last 3 Encounters:  10/21/18 178 lb (80.7 kg)  10/17/18 181 lb 6.4 oz (82.3 kg)  10/07/18 179 lb 6.4 oz (81.4 kg)    Physical Exam Vitals signs reviewed.  HENT:     Head: Normocephalic and atraumatic.  Eyes:     Pupils: Pupils are equal, round, and  reactive to light.  Neck:     Musculoskeletal: Normal range of motion.  Cardiovascular:     Rate and Rhythm: Normal rate and regular rhythm.     Heart sounds: Normal heart sounds.  Pulmonary:     Effort: Pulmonary effort is normal.     Breath sounds: Normal breath sounds.  Abdominal:     General: Bowel sounds are normal.     Palpations: Abdomen is soft.  Musculoskeletal: Normal range of motion.  General: No tenderness or deformity.  Lymphadenopathy:     Cervical: No cervical adenopathy.  Skin:    General: Skin is warm and dry.     Findings: No erythema or rash.  Neurological:     Mental Status: She is alert and oriented to person, place, and time.  Psychiatric:        Behavior: Behavior normal.        Thought Content: Thought content normal.        Judgment: Judgment normal.      Lab Results  Component Value Date   WBC 2.7 (L) 10/21/2018   HGB 10.9 (L) 10/21/2018   HCT 35.0 (L) 10/21/2018   MCV 89.7 10/21/2018   PLT 135 (L) 10/21/2018   Lab Results  Component Value Date   FERRITIN 169 09/16/2016   IRON 59 09/16/2016   TIBC 281 09/16/2016   UIBC 222 09/16/2016   IRONPCTSAT 21 09/16/2016   Lab Results  Component Value Date   RETICCTPCT 0.8 07/16/2016   RBC 3.90 10/21/2018   RETICCTABS 32,640 07/16/2016   Lab Results  Component Value Date   KPAFRELGTCHN 47.0 (H) 10/07/2018   LAMBDASER 11.5 10/07/2018   KAPLAMBRATIO 4.09 (H) 10/07/2018   Lab Results  Component Value Date   IGGSERUM 2,397 (H) 10/07/2018   IGA 180 10/07/2018   IGMSERUM 100 10/07/2018   Lab Results  Component Value Date   TOTALPROTELP 7.6 10/07/2018   ALBUMINELP 3.5 10/07/2018   A1GS 0.2 10/07/2018   A2GS 0.7 10/07/2018   BETS 1.0 10/07/2018   BETA2SER 0.5 07/16/2016   GAMS 2.1 (H) 10/07/2018   MSPIKE 1.3 (H) 10/07/2018   SPEI Comment 10/07/2018     Chemistry      Component Value Date/Time   NA 136 10/21/2018 0846   NA 143 11/08/2017 1127   NA 140 12/31/2016 1000   K 3.0  (L) 10/21/2018 0846   K 4.0 11/08/2017 1127   K 3.6 12/31/2016 1000   CL 101 10/21/2018 0846   CL 107 11/08/2017 1127   CO2 29 10/21/2018 0846   CO2 25 11/08/2017 1127   CO2 24 12/31/2016 1000   BUN 12 10/21/2018 0846   BUN 19 11/08/2017 1127   BUN 22.3 12/31/2016 1000   CREATININE 0.69 10/21/2018 0846   CREATININE 0.9 11/08/2017 1127   CREATININE 0.8 12/31/2016 1000      Component Value Date/Time   CALCIUM 8.9 10/21/2018 0846   CALCIUM 9.9 11/08/2017 1127   CALCIUM 9.5 12/31/2016 1000   ALKPHOS 38 10/21/2018 0846   ALKPHOS 44 11/08/2017 1127   ALKPHOS 51 12/31/2016 1000   AST 18 10/21/2018 0846   AST 28 12/31/2016 1000   ALT 15 10/21/2018 0846   ALT 36 11/08/2017 1127   ALT 16 12/31/2016 1000   BILITOT 0.6 10/21/2018 0846   BILITOT 0.46 12/31/2016 1000      Impression and Plan: Ms. Fitting is a very pleasant 62 yo African American female with a previous IgG kappa MGUS with progression to myeloma.   She is responding nicely.  Her M spike has been coming down.  We will see what her monoclonal studies show today.  Hopefully, we will be in a position where we can cut back her Velcade injections to every 2 weeks.  We will see her back next week.  They will be the start of her fourth cycle of RVD   [name], MD 12/13/20199:54 AM

## 2018-10-21 NOTE — Telephone Encounter (Signed)
Appointments scheduled avs/calendar printed per 12/13 los °

## 2018-10-21 NOTE — Progress Notes (Signed)
Pt to not receive treatment today per Dr. Marin Olp, but does need potassium 40 meq po today prior to discharge from office.

## 2018-10-22 DIAGNOSIS — J40 Bronchitis, not specified as acute or chronic: Secondary | ICD-10-CM | POA: Diagnosis not present

## 2018-10-23 NOTE — Assessment & Plan Note (Signed)
Diarrhea improved only one episode recently of diarrhea resolved quickly.

## 2018-10-23 NOTE — Progress Notes (Signed)
Subjective:    [name], female    DOB: 1956-09-01, 62 y.o.   MRN: [id number]  No chief complaint on file.   HPI Patient is in today for follow-up.  She is noting swelling in all of her eyelids with some itching, redness and slight discharge from the eyes.  She is tried some hot compresses without avail.  No fevers or chills.  No other upper respiratory infection symptoms.  She has had one episode of diarrhea since her last visit but it resolved spontaneously.  No significant abdominal pain or new concerns are noted today.  No hospitalization or febrile illness since last visit. Denies CP/palp/SOB/HA/congestion/fevers/GI or GU c/o. Taking meds as prescribed  Past Medical History:  Diagnosis Date  . Abnormal SPEP 07/26/2016  . Arthritis    knees, hands  . Back pain 07/14/2016  . Bowel obstruction ([place]) 11/2014  . C. difficile diarrhea   . CHF (congestive heart failure) ([place])   . Congestive heart failure ([place]) 02/24/2015   S/p pacemaker  . Depression   . Elevated sed rate 08/15/2017  . Epigastric pain 03/22/2017  . GERD (gastroesophageal reflux disease)   . Hyperlipidemia   . Hypertension   . 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 Low back pain 07/14/2016  . 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 Multiple myeloma ([place]) 07/20/2018  . Multiple myeloma not having achieved remission ([place]) 07/20/2018  . Myalgia 12/31/2016  . Obstructive sleep apnea 03/31/2015  . SOB (shortness of breath) 04/09/2016  . Stroke [facility])    TIAs    Past Surgical History:  Procedure Laterality Date  . ABDOMINAL HYSTERECTOMY     menorraghia, 2006, total  . CHOLECYSTECTOMY    . KNEE SURGERY     right, repair torn torn cartialage  . PACEMAKER INSERTION  08/2014  . TONSILLECTOMY    . TUBAL LIGATION      Family History  Problem Relation Age of Onset  . Diabetes Mother   . Hypertension Mother   . Heart disease Mother        s/p 1 stent  . Hyperlipidemia Mother     . Arthritis Mother   . Cancer Father        COLON  . Colon cancer Father 34  . Irritable bowel syndrome Sister   . Hyperlipidemia Daughter   . Hypertension Daughter   . Hypertension Maternal Grandmother   . Arthritis Maternal Grandmother   . Heart disease Maternal Grandfather        MI  . Hypertension Maternal Grandfather   . Arthritis Maternal Grandfather   . Hypertension Son     Social History   Socioeconomic History  . Marital status: Divorced    Spouse name: [name]  . Number of children: 3  . Years of education: [name]  . Highest education level: [name]  Occupational History  . [name]  Social Needs  . Financial resource strain: [name]  . Food insecurity:    Worry: [name]    Inability: [name]  . Transportation needs:    Medical: [name]    Non-medical: [name]  Tobacco Use  . Smoking status: Never Smoker  . Smokeless tobacco: Never Used  Substance and Sexual Activity  . Alcohol use: No    Alcohol/week: 0.0 standard drinks  . Drug use: No  . Sexual activity: [name]  Lifestyle  . Physical activity:  Days per week: [name]    Minutes per session: [name]  . Stress: [name]  Relationships  . Social connections:    Talks on phone: [name]    Gets together: [name]    Attends religious service: [name]    Active member of club or organization: [name]    Attends meetings of clubs or organizations: [name]    Relationship status: [name]  . Intimate partner violence:    Fear of current or ex partner: [name]    Emotionally abused: [name]    Physically abused: [name]    Forced sexual activity: [name]  Other Topics Concern  . [name]  Social History Narrative  . [name]    Outpatient Medications Prior to Visit  Medication Sig Dispense Refill  . acetaminophen (TYLENOL) 325 MG tablet Take 650 mg by mouth.    Marland Kitchen albuterol (PROVENTIL HFA;VENTOLIN  HFA) 108 (90 Base) MCG/ACT inhaler Inhale 2 puffs into the lungs every 6 (six) hours as needed for wheezing or shortness of breath. 1 Inhaler 2  . amLODipine (NORVASC) 10 MG tablet Take 1 tablet (10 mg total) by mouth daily. 90 tablet 1  . aspirin EC 81 MG tablet Take 1 tablet (81 mg total) by mouth daily. 90 tablet 1  . carvedilol (COREG) 25 MG tablet Take 1 tablet (25 mg total) by mouth 2 (two) times daily with a meal. 180 tablet 1  . conjugated estrogens (PREMARIN) vaginal cream Place vaginally.    . cyclobenzaprine (FLEXERIL) 10 MG tablet Take by mouth.    . dexamethasone (DECADRON) 4 MG tablet Take 5 pills at one time with food ONCE a WEEK!! 100 tablet 2  . dicyclomine (BENTYL) 20 MG tablet Take 1 tablet (20 mg total) by mouth 3 (three) times daily as needed for spasms. 90 tablet 1  . famciclovir (FAMVIR) 250 MG tablet Take 1 tablet (250 mg total) by mouth daily. 30 tablet 12  . famotidine (PEPCID) 40 MG/5ML suspension Take 5 mLs (40 mg total) by mouth at bedtime. 150 mL 5  . fluticasone (FLONASE) 50 MCG/ACT nasal spray Place 2 sprays into both nostrils daily. 16 g 1  . HYDROcodone-acetaminophen (NORCO/VICODIN) 5-325 MG tablet Take 1 tablet by mouth every 4 (four) hours as needed. 15 tablet 0  . hyoscyamine (LEVSIN SL) 0.125 MG SL tablet Place 1 tablet (0.125 mg total) under the tongue every 4 (four) hours as needed. 30 tablet 2  . lenalidomide (REVLIMID) 20 MG capsule Take 1 pill daily for 3 weeks on and 1 week off.  Take at bedtime Auth#[id number] 21 capsule 0  . levocetirizine (XYZAL) 5 MG tablet Take 1 tablet (5 mg total) by mouth every evening. 30 tablet 2  . lidocaine (LIDODERM) 5 % Place 1 patch onto the skin daily. Remove & Discard patch within 12 hours or as directed by MD 30 patch 2  . ondansetron (ZOFRAN) 4 MG tablet Take 1 tablet (4 mg total) by mouth every 8 (eight) hours as needed for nausea or vomiting. 30 tablet 1  . pantoprazole (PROTONIX) 40 MG tablet Take 1 tablet (40 mg total)  by mouth 2 (two) times daily. 180 tablet 1  . potassium chloride SA (KLOR-CON M20) 20 MEQ tablet Take 1 tablet (20 mEq total) by mouth daily. 90 tablet 1  . prochlorperazine (COMPAZINE) 10 MG tablet Take 1  tablet (10 mg total) by mouth every 6 (six) hours as needed (Nausea or vomiting). 30 tablet 1  . sucralfate (CARAFATE) 1 g tablet Take 1 tablet (1 g total) by mouth 4 (four) times daily -  with meals and at bedtime. 120 tablet 0  . topiramate (TOPAMAX) 25 MG tablet Take 1 tablet (25 mg total) by mouth 2 (two) times daily. 180 tablet 0  . potassium chloride SA (K-DUR,KLOR-CON) 20 MEQ tablet Take 2 tablets (40 mEq total) by mouth once for 1 dose. 60 tablet 3   No facility-administered medications prior to visit.     Allergies  Allergen Reactions  . Benazepril Anaphylaxis and Swelling    angioedema Throat and lip swelling  . Ondansetron Hcl Hives    Redness and hives post IV admin on 07/05/17  . Codeine Nausea And Vomiting  . Morphine Hives    Redness and hives noted post IV admin on 07/05/17    Review of Systems  Constitutional: Negative for fever and malaise/fatigue.  HENT: Negative for congestion.   Eyes: Positive for discharge and redness. Negative for blurred vision, photophobia and pain.  Respiratory: Negative for shortness of breath.   Cardiovascular: Negative for chest pain, palpitations and leg swelling.  Gastrointestinal: Negative for abdominal pain, blood in stool and nausea.  Genitourinary: Negative for dysuria and frequency.  Musculoskeletal: Negative for falls.  Skin: Negative for rash.  Neurological: Negative for dizziness, loss of consciousness and headaches.  Endo/Heme/Allergies: Negative for environmental allergies.  Psychiatric/Behavioral: Negative for depression. The patient is not nervous/anxious.        Objective:    Physical Exam Vitals signs and nursing note reviewed.  Constitutional:      General: She is not in acute distress.    Appearance: She is  well-developed.  HENT:     Head: Normocephalic and atraumatic.     Nose: Nose normal.  Eyes:     General:        Right eye: Discharge present.        Left eye: Discharge present.    Extraocular Movements: Extraocular movements intact.     Pupils: Pupils are equal, round, and reactive to light.     Comments: Eyelids swollen and erythematous. Slight discharge at corners.   Neck:     Musculoskeletal: Normal range of motion and neck supple.  Cardiovascular:     Rate and Rhythm: Normal rate and regular rhythm.     Heart sounds: No murmur.  Pulmonary:     Effort: Pulmonary effort is normal.     Breath sounds: Normal breath sounds.  Abdominal:     General: Bowel sounds are normal.     Palpations: Abdomen is soft.     Tenderness: There is no abdominal tenderness.  Skin:    General: Skin is warm and dry.  Neurological:     Mental Status: She is alert and oriented to person, place, and time.     BP 122/82 (BP Location: Left Arm, Patient Position: Sitting, Cuff Size: Normal)   Pulse 78   Temp 97.6 F (36.4 C) (Oral)   Resp 18   Ht '5\' 7"'$  (1.702 m)   Wt 181 lb 6.4 oz (82.3 kg)   SpO2 97%   BMI 28.41 kg/m  Wt Readings from Last 3 Encounters:  10/21/18 178 lb (80.7 kg)  10/17/18 181 lb 6.4 oz (82.3 kg)  10/07/18 179 lb 6.4 oz (81.4 kg)     Lab Results  Component Value Date  WBC 2.7 (L) 10/21/2018   HGB 10.9 (L) 10/21/2018   HCT 35.0 (L) 10/21/2018   PLT 135 (L) 10/21/2018   GLUCOSE 86 10/21/2018   CHOL 137 02/15/2018   TRIG 100.0 02/15/2018   HDL 45.90 02/15/2018   LDLCALC 72 02/15/2018   ALT 15 10/21/2018   AST 18 10/21/2018   NA 136 10/21/2018   K 3.0 (L) 10/21/2018   CL 101 10/21/2018   CREATININE 0.69 10/21/2018   BUN 12 10/21/2018   CO2 29 10/21/2018   TSH 1.79 02/15/2018   INR 0.90 07/28/2018    Lab Results  Component Value Date   TSH 1.79 02/15/2018   Lab Results  Component Value Date   WBC 2.7 (L) 10/21/2018   HGB 10.9 (L) 10/21/2018   HCT 35.0  (L) 10/21/2018   MCV 89.7 10/21/2018   PLT 135 (L) 10/21/2018   Lab Results  Component Value Date   NA 136 10/21/2018   K 3.0 (L) 10/21/2018   CHLORIDE 108 12/31/2016   CO2 29 10/21/2018   GLUCOSE 86 10/21/2018   BUN 12 10/21/2018   CREATININE 0.69 10/21/2018   BILITOT 0.6 10/21/2018   ALKPHOS 38 10/21/2018   AST 18 10/21/2018   ALT 15 10/21/2018   PROT 7.6 10/21/2018   ALBUMIN 3.7 10/21/2018   CALCIUM 8.9 10/21/2018   ANIONGAP 6 10/21/2018   EGFR >90 12/31/2016   GFR 129.29 10/04/2018   Lab Results  Component Value Date   CHOL 137 02/15/2018   Lab Results  Component Value Date   HDL 45.90 02/15/2018   Lab Results  Component Value Date   LDLCALC 72 02/15/2018   Lab Results  Component Value Date   TRIG 100.0 02/15/2018   Lab Results  Component Value Date   CHOLHDL 3 02/15/2018   No results found for: HGBA1C     Assessment & Plan:   Problem List Items Addressed This Visit    Multiple myeloma ([place]) (Chronic)    tolerating treatment shots but small local reaction this week. Treat with Witch Hazel Astringent and Mupirocin ointment and let oncology know if worse      Hypertension    Well controlled, no changes to meds. Encouraged heart healthy diet such as the DASH diet and exercise as tolerated.       Congestive heart failure ([place])    Doing well, follows with cardiology, no changes      C. difficile colitis    Diarrhea improved only one episode recently of diarrhea resolved quickly.       Relevant Medications   mupirocin ointment (BACTROBAN) 2 %   Diarrhea    Doing much better with only one episode that responded to Imodium      Conjunctivitis    polytrim tid and hot compresses. Seek care if worsens         I am having Gershon Mussel start on trimethoprim-polymyxin b and mupirocin ointment. I am also having her maintain her conjugated estrogens, acetaminophen, carvedilol, pantoprazole, aspirin EC, dicyclomine, ondansetron, lidocaine,  topiramate, levocetirizine, sucralfate, famciclovir, hyoscyamine, prochlorperazine, dexamethasone, amLODipine, fluticasone, albuterol, cyclobenzaprine, HYDROcodone-acetaminophen, famotidine, potassium chloride SA, potassium chloride SA, and lenalidomide.  Meds ordered this encounter  Medications  . trimethoprim-polymyxin b (POLYTRIM) ophthalmic solution    Sig: Place 1 drop into both eyes 3 (three) times daily as needed.    Dispense:  10 mL    Refill:  0  . mupirocin ointment (BACTROBAN) 2 %    Sig: Apply 1 application topically  2 (two) times daily.    Dispense:  22 g    Refill:  0     Penni Homans, MD

## 2018-10-23 NOTE — Assessment & Plan Note (Signed)
Doing well, follows with cardiology, no changes

## 2018-10-25 ENCOUNTER — Other Ambulatory Visit: Payer: Self-pay | Admitting: *Deleted

## 2018-10-25 DIAGNOSIS — C9000 Multiple myeloma not having achieved remission: Secondary | ICD-10-CM

## 2018-10-25 MED ORDER — LENALIDOMIDE 20 MG PO CAPS: ORAL_CAPSULE | ORAL | 0 refills | Status: DC

## 2018-10-28 ENCOUNTER — Inpatient Hospital Stay: Payer: Self-pay

## 2018-10-28 ENCOUNTER — Encounter: Payer: Self-pay | Admitting: Hematology & Oncology

## 2018-10-28 ENCOUNTER — Inpatient Hospital Stay (HOSPITAL_BASED_OUTPATIENT_CLINIC_OR_DEPARTMENT_OTHER): Payer: Medicare Other | Admitting: Hematology & Oncology

## 2018-10-28 ENCOUNTER — Other Ambulatory Visit: Payer: Self-pay

## 2018-10-28 ENCOUNTER — Inpatient Hospital Stay: Payer: Medicare Other

## 2018-10-28 VITALS — BP 153/88 | HR 82 | Temp 98.4°F | Resp 18 | Wt 181.0 lb

## 2018-10-28 DIAGNOSIS — C9000 Multiple myeloma not having achieved remission: Secondary | ICD-10-CM

## 2018-10-28 DIAGNOSIS — Z5112 Encounter for antineoplastic immunotherapy: Secondary | ICD-10-CM | POA: Diagnosis not present

## 2018-10-28 LAB — CBC WITH DIFFERENTIAL (CANCER CENTER ONLY)
Abs Immature Granulocytes: 0.01 10*3/uL (ref 0.00–0.07)
Basophils Absolute: 0.1 10*3/uL (ref 0.0–0.1)
Basophils Relative: 2 %
EOS ABS: 0.2 10*3/uL (ref 0.0–0.5)
Eosinophils Relative: 8 %
HCT: 37.5 % (ref 36.0–46.0)
Hemoglobin: 11.5 g/dL — ABNORMAL LOW (ref 12.0–15.0)
Immature Granulocytes: 0 %
Lymphocytes Relative: 31 %
Lymphs Abs: 0.8 10*3/uL (ref 0.7–4.0)
MCH: 27.7 pg (ref 26.0–34.0)
MCHC: 30.7 g/dL (ref 30.0–36.0)
MCV: 90.4 fL (ref 80.0–100.0)
Monocytes Absolute: 0.3 10*3/uL (ref 0.1–1.0)
Monocytes Relative: 13 %
Neutro Abs: 1.2 10*3/uL — ABNORMAL LOW (ref 1.7–7.7)
Neutrophils Relative %: 46 %
Platelet Count: 144 10*3/uL — ABNORMAL LOW (ref 150–400)
RBC: 4.15 MIL/uL (ref 3.87–5.11)
RDW: 15.9 % — ABNORMAL HIGH (ref 11.5–15.5)
WBC Count: 2.5 10*3/uL — ABNORMAL LOW (ref 4.0–10.5)
nRBC: 0 % (ref 0.0–0.2)

## 2018-10-28 LAB — CMP (CANCER CENTER ONLY)
ALT: 14 U/L (ref 0–44)
AST: 18 U/L (ref 15–41)
Albumin: 3.9 g/dL (ref 3.5–5.0)
Alkaline Phosphatase: 44 U/L (ref 38–126)
Anion gap: 7 (ref 5–15)
BUN: 15 mg/dL (ref 8–23)
CO2: 28 mmol/L (ref 22–32)
Calcium: 9.1 mg/dL (ref 8.9–10.3)
Chloride: 105 mmol/L (ref 98–111)
Creatinine: 0.76 mg/dL (ref 0.44–1.00)
Glucose, Bld: 91 mg/dL (ref 70–99)
Potassium: 3.3 mmol/L — ABNORMAL LOW (ref 3.5–5.1)
Sodium: 140 mmol/L (ref 135–145)
TOTAL PROTEIN: 7.8 g/dL (ref 6.5–8.1)
Total Bilirubin: 0.5 mg/dL (ref 0.3–1.2)

## 2018-10-28 LAB — LACTATE DEHYDROGENASE: LDH: 227 U/L — ABNORMAL HIGH (ref 98–192)

## 2018-10-28 MED ORDER — PROCHLORPERAZINE MALEATE 10 MG PO TABS
ORAL_TABLET | ORAL | Status: AC
  Filled 2018-10-28: qty 1

## 2018-10-28 MED ORDER — PROCHLORPERAZINE MALEATE 10 MG PO TABS
10.0000 mg | ORAL_TABLET | Freq: Once | ORAL | Status: AC
  Administered 2018-10-28: 10 mg via ORAL

## 2018-10-28 MED ORDER — BORTEZOMIB CHEMO SQ INJECTION 3.5 MG (2.5MG/ML)
1.3000 mg/m2 | Freq: Once | INTRAMUSCULAR | Status: AC
  Administered 2018-10-28: 2.5 mg via SUBCUTANEOUS
  Filled 2018-10-28: qty 1

## 2018-10-28 NOTE — Progress Notes (Signed)
OK to treat with Wayne and labs today per MD Ennever

## 2018-10-28 NOTE — Patient Instructions (Signed)
[facility] Discharge Instructions for Patients Receiving Chemotherapy  Today you received the following chemotherapy agents Velcade. To help prevent nausea and vomiting after your treatment, we encourage you to take your nausea medication as directed.  If you develop nausea and vomiting that is not controlled by your nausea medication, call the clinic.   BELOW ARE SYMPTOMS THAT SHOULD BE REPORTED IMMEDIATELY:  *FEVER GREATER THAN 100.5 F  *CHILLS WITH OR WITHOUT FEVER  NAUSEA AND VOMITING THAT IS NOT CONTROLLED WITH YOUR NAUSEA MEDICATION  *UNUSUAL SHORTNESS OF BREATH  *UNUSUAL BRUISING OR BLEEDING  TENDERNESS IN MOUTH AND THROAT WITH OR WITHOUT PRESENCE OF ULCERS  *URINARY PROBLEMS  *BOWEL PROBLEMS  UNUSUAL RASH Items with * indicate a potential emergency and should be followed up as soon as possible.  Feel free to call the clinic you have any questions or concerns. The clinic phone number is [phone].  Please show the CHEMO ALERT CARD at check-in to the Emergency Department and triage nurse.    

## 2018-10-28 NOTE — Progress Notes (Signed)
Hematology and Oncology Follow Up Visit  Tami [name] [id number] Jan 03, 1956 62 y.o. 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  She actually is here 1 week early for treatment.  As such, we will not treat her today.  I am not sure how she was scheduled for today.  Her myeloma studies are improving nicely.  Her M spike on 10/07/2018 was down to 1.3 g/dL.  Her IgG level was 2400 mg/dL.  Her kappa light chain was 4.7 mg/dL.  She is having some reaction where she is getting the Velcade injections.  She was put on some Bactroban ointment by her family doctor.  She is had no nausea or vomiting.  She has had no diarrhea.  There is been no fever.  She has had no cough.  She has had no leg swelling.  There is been no urinary issues.  Overall, her performance status is ECOG 1.  Medications:  Allergies as of 10/28/2018      Reactions   Benazepril Anaphylaxis, Swelling   angioedema Throat and lip swelling   Ondansetron Hcl Hives   Redness and hives post IV admin on 07/05/17   Codeine Nausea And Vomiting   Morphine Hives   Redness and hives noted post IV admin on 07/05/17      Medication List       Accurate as of October 28, 2018 10:50 AM. Always use your most recent med list.        acetaminophen 325 MG tablet Commonly known as:  TYLENOL Take 650 mg by mouth.   albuterol 108 (90 Base) MCG/ACT inhaler Commonly known as:  PROVENTIL HFA;VENTOLIN HFA Inhale 2 puffs into the lungs every 6 (six) hours as needed for wheezing or shortness of breath.   amLODipine 10 MG tablet Commonly known as:  NORVASC Take 1 tablet (10 mg total) by mouth daily.   aspirin EC 81 MG tablet Take 1 tablet (81 mg total) by mouth daily.   carvedilol 25 MG tablet Commonly known as:  COREG Take 1 tablet (25 mg total) by mouth 2 (two) times daily  with a meal.   cyclobenzaprine 10 MG tablet Commonly known as:  FLEXERIL Take by mouth.   dexamethasone 4 MG tablet Commonly known as:  DECADRON Take 5 pills at one time with food ONCE a WEEK!!   dicyclomine 20 MG tablet Commonly known as:  BENTYL Take 1 tablet (20 mg total) by mouth 3 (three) times daily as needed for spasms.   famciclovir 250 MG tablet Commonly known as:  FAMVIR Take 1 tablet (250 mg total) by mouth daily.   famotidine 40 MG/5ML suspension Commonly known as:  PEPCID Take 5 mLs (40 mg total) by mouth at bedtime.   fluticasone 50 MCG/ACT nasal spray Commonly known as:  FLONASE Place 2 sprays into both nostrils daily.   HYDROcodone-acetaminophen 5-325 MG tablet Commonly known as:  NORCO/VICODIN Take 1 tablet by mouth every 4 (four) hours as needed.   hyoscyamine 0.125 MG SL tablet Commonly known as:  LEVSIN SL Place 1 tablet (0.125 mg total) under the tongue every 4 (four) hours as needed.   lenalidomide 20 MG capsule Commonly known as:  REVLIMID Take 1 pill daily for 3 weeks on and 1 week off.  Take at bedtime [id number]   levocetirizine 5 MG tablet Commonly known as:  XYZAL Take 1 tablet (5 mg total) by mouth every evening.   lidocaine 5 % Commonly known as:  LIDODERM Place 1 patch onto the skin daily. Remove & Discard patch within 12 hours or as directed by MD   MULTIVITAMIN ADULT PO Take by mouth.   mupirocin ointment 2 % Commonly known as:  BACTROBAN Apply 1 application topically 2 (two) times daily.   ondansetron 4 MG tablet Commonly known as:  ZOFRAN Take 1 tablet (4 mg total) by mouth every 8 (eight) hours as needed for nausea or vomiting.   pantoprazole 40 MG tablet Commonly known as:  PROTONIX Take 1 tablet (40 mg total) by mouth 2 (two) times daily.   potassium chloride SA 20 MEQ tablet Commonly known as:  KLOR-CON M20 Take 1 tablet (20 mEq total) by mouth daily.   potassium chloride SA 20 MEQ tablet Commonly known as:   K-DUR,KLOR-CON Take 2 tablets (40 mEq total) by mouth once for 1 dose.   PREMARIN vaginal cream Generic drug:  conjugated estrogens Place vaginally.   prochlorperazine 10 MG tablet Commonly known as:  COMPAZINE Take 1 tablet (10 mg total) by mouth every 6 (six) hours as needed (Nausea or vomiting).   sucralfate 1 g tablet Commonly known as:  CARAFATE Take 1 tablet (1 g total) by mouth 4 (four) times daily -  with meals and at bedtime.   topiramate 25 MG tablet Commonly known as:  TOPAMAX Take 1 tablet (25 mg total) by mouth 2 (two) times daily.   trimethoprim-polymyxin b ophthalmic solution Commonly known as:  POLYTRIM Place 1 drop into both eyes 3 (three) times daily as needed.       Allergies:  Allergies  Allergen Reactions  . Benazepril Anaphylaxis and Swelling    angioedema Throat and lip swelling  . Ondansetron Hcl Hives    Redness and hives post IV admin on 07/05/17  . Codeine Nausea And Vomiting  . Morphine Hives    Redness and hives noted post IV admin on 07/05/17    Past Medical History, Surgical history, Social history, and Family History were reviewed and updated.  Review of Systems: Review of Systems  Constitutional: Negative.   HENT: Negative.   Eyes: Negative.   Respiratory: Negative.   Cardiovascular: Negative.   Gastrointestinal: Negative.   Genitourinary: Negative.   Musculoskeletal: Negative.   Skin: Positive for rash.  Neurological: Negative.   Endo/Heme/Allergies: Negative.   Psychiatric/Behavioral: Negative.      Physical Exam:  weight is 181 lb (82.1 kg). Her oral temperature is 98.4 F (36.9 C). Her blood pressure is 153/88 (abnormal) and her pulse is 82. Her respiration is 18 and oxygen saturation is 100%.   Wt Readings from Last 3 Encounters:  10/28/18 181 lb (82.1 kg)  10/21/18 178 lb (80.7 kg)  10/17/18 181 lb 6.4 oz (82.3 kg)    Physical Exam Vitals signs reviewed.  HENT:     Head: Normocephalic and atraumatic.  Eyes:      Pupils: Pupils are equal, round, and reactive to light.  Neck:     Musculoskeletal: Normal range of motion.  Cardiovascular:     Rate and Rhythm: Normal rate and regular rhythm.     Heart sounds: Normal heart sounds.  Pulmonary:     Effort: Pulmonary effort is normal.     Breath sounds: Normal breath sounds.  Abdominal:     General: Bowel sounds are normal.     Palpations: Abdomen is soft.  Musculoskeletal: Normal  range of motion.        General: No tenderness or deformity.  Lymphadenopathy:     Cervical: No cervical adenopathy.  Skin:    General: Skin is warm and dry.     Findings: No erythema or rash.  Neurological:     Mental Status: She is alert and oriented to person, place, and time.  Psychiatric:        Behavior: Behavior normal.        Thought Content: Thought content normal.        Judgment: Judgment normal.      Lab Results  Component Value Date   WBC 2.5 (L) 10/28/2018   HGB 11.5 (L) 10/28/2018   HCT 37.5 10/28/2018   MCV 90.4 10/28/2018   PLT 144 (L) 10/28/2018   Lab Results  Component Value Date   FERRITIN 169 09/16/2016   IRON 59 09/16/2016   TIBC 281 09/16/2016   UIBC 222 09/16/2016   IRONPCTSAT 21 09/16/2016   Lab Results  Component Value Date   RETICCTPCT 0.8 07/16/2016   RBC 4.15 10/28/2018   RETICCTABS 32,640 07/16/2016   Lab Results  Component Value Date   KPAFRELGTCHN 47.0 (H) 10/07/2018   LAMBDASER 11.5 10/07/2018   KAPLAMBRATIO 4.09 (H) 10/07/2018   Lab Results  Component Value Date   IGGSERUM 2,397 (H) 10/07/2018   IGA 180 10/07/2018   IGMSERUM 100 10/07/2018   Lab Results  Component Value Date   TOTALPROTELP 7.6 10/07/2018   ALBUMINELP 3.5 10/07/2018   A1GS 0.2 10/07/2018   A2GS 0.7 10/07/2018   BETS 1.0 10/07/2018   BETA2SER 0.5 07/16/2016   GAMS 2.1 (H) 10/07/2018   MSPIKE 1.3 (H) 10/07/2018   SPEI Comment 10/07/2018     Chemistry      Component Value Date/Time   NA 140 10/28/2018 0944   NA 143 11/08/2017  1127   NA 140 12/31/2016 1000   K 3.3 (L) 10/28/2018 0944   K 4.0 11/08/2017 1127   K 3.6 12/31/2016 1000   CL 105 10/28/2018 0944   CL 107 11/08/2017 1127   CO2 28 10/28/2018 0944   CO2 25 11/08/2017 1127   CO2 24 12/31/2016 1000   BUN 15 10/28/2018 0944   BUN 19 11/08/2017 1127   BUN 22.3 12/31/2016 1000   CREATININE 0.76 10/28/2018 0944   CREATININE 0.9 11/08/2017 1127   CREATININE 0.8 12/31/2016 1000      Component Value Date/Time   CALCIUM 9.1 10/28/2018 0944   CALCIUM 9.9 11/08/2017 1127   CALCIUM 9.5 12/31/2016 1000   ALKPHOS 44 10/28/2018 0944   ALKPHOS 44 11/08/2017 1127   ALKPHOS 51 12/31/2016 1000   AST 18 10/28/2018 0944   AST 28 12/31/2016 1000   ALT 14 10/28/2018 0944   ALT 36 11/08/2017 1127   ALT 16 12/31/2016 1000   BILITOT 0.5 10/28/2018 0944   BILITOT 0.46 12/31/2016 1000      Impression and Plan: Ms. Beadles is a very pleasant 62 yo African American female with a previous IgG kappa MGUS with progression to myeloma.   She is responding nicely.  Her M spike has been coming down.  We will see what her monoclonal studies show today.  We will go ahead with her fourth cycle of RVD today.  Hopefully, we will be in a position where we can cut back her Velcade injections to every 2 weeks.  We will plan to get her back in 1 month.  Again, if we can  see some improvement in her myeloma numbers, we can adjust her Velcade injections.    [name], MD 12/20/201910:50 AM

## 2018-10-29 ENCOUNTER — Emergency Department (HOSPITAL_BASED_OUTPATIENT_CLINIC_OR_DEPARTMENT_OTHER)
Admission: EM | Admit: 2018-10-29 | Discharge: 2018-10-29 | Disposition: A | Discharge status: Home / Self Care | Payer: Medicare Other | Attending: Emergency Medicine | Admitting: Emergency Medicine

## 2018-10-29 ENCOUNTER — Other Ambulatory Visit: Payer: Self-pay

## 2018-10-29 ENCOUNTER — Encounter (HOSPITAL_BASED_OUTPATIENT_CLINIC_OR_DEPARTMENT_OTHER): Payer: Self-pay | Admitting: Emergency Medicine

## 2018-10-29 DIAGNOSIS — Z79899 Other long term (current) drug therapy: Secondary | ICD-10-CM | POA: Diagnosis not present

## 2018-10-29 DIAGNOSIS — H00014 Hordeolum externum left upper eyelid: Secondary | ICD-10-CM | POA: Diagnosis not present

## 2018-10-29 DIAGNOSIS — I509 Heart failure, unspecified: Secondary | ICD-10-CM | POA: Insufficient documentation

## 2018-10-29 DIAGNOSIS — H00011 Hordeolum externum right upper eyelid: Secondary | ICD-10-CM | POA: Insufficient documentation

## 2018-10-29 DIAGNOSIS — Z8673 Personal history of transient ischemic attack (TIA), and cerebral infarction without residual deficits: Secondary | ICD-10-CM | POA: Diagnosis not present

## 2018-10-29 DIAGNOSIS — I110 Hypertensive heart disease with heart failure: Secondary | ICD-10-CM | POA: Diagnosis not present

## 2018-10-29 DIAGNOSIS — H1033 Unspecified acute conjunctivitis, bilateral: Secondary | ICD-10-CM | POA: Diagnosis not present

## 2018-10-29 DIAGNOSIS — F329 Major depressive disorder, single episode, unspecified: Secondary | ICD-10-CM | POA: Diagnosis not present

## 2018-10-29 DIAGNOSIS — H5713 Ocular pain, bilateral: Secondary | ICD-10-CM | POA: Diagnosis not present

## 2018-10-29 DIAGNOSIS — Z950 Presence of cardiac pacemaker: Secondary | ICD-10-CM | POA: Diagnosis not present

## 2018-10-29 DIAGNOSIS — Z1231 Encounter for screening mammogram for malignant neoplasm of breast: Secondary | ICD-10-CM | POA: Diagnosis not present

## 2018-10-29 DIAGNOSIS — Z7982 Long term (current) use of aspirin: Secondary | ICD-10-CM | POA: Diagnosis not present

## 2018-10-29 DIAGNOSIS — Z8579 Personal history of other malignant neoplasms of lymphoid, hematopoietic and related tissues: Secondary | ICD-10-CM | POA: Insufficient documentation

## 2018-10-29 DIAGNOSIS — H109 Unspecified conjunctivitis: Secondary | ICD-10-CM | POA: Diagnosis not present

## 2018-10-29 DIAGNOSIS — Z9049 Acquired absence of other specified parts of digestive tract: Secondary | ICD-10-CM | POA: Diagnosis not present

## 2018-10-29 DIAGNOSIS — H5789 Other specified disorders of eye and adnexa: Secondary | ICD-10-CM | POA: Diagnosis present

## 2018-10-29 LAB — IGG, IGA, IGM
IGM (IMMUNOGLOBULIN M), SRM: 109 mg/dL (ref 26–217)
IgA: 188 mg/dL (ref 87–352)
IgG (Immunoglobin G), Serum: 2481 mg/dL — ABNORMAL HIGH (ref 700–1600)

## 2018-10-29 LAB — BETA 2 MICROGLOBULIN, SERUM: Beta-2 Microglobulin: 1.5 mg/L (ref 0.6–2.4)

## 2018-10-29 MED ORDER — ERYTHROMYCIN 5 MG/GM OP OINT: TOPICAL_OINTMENT | OPHTHALMIC | 0 refills | Status: DC

## 2018-10-29 MED ORDER — CEPHALEXIN 500 MG PO CAPS: 500.0000 mg | ORAL_CAPSULE | Freq: Two times a day (BID) | ORAL | 0 refills | Status: AC

## 2018-10-29 NOTE — ED Notes (Signed)
ED Provider at bedside. 

## 2018-10-29 NOTE — Discharge Instructions (Signed)
Take the antibiotics as prescribed. Return to ED for worsening symptoms, increased swelling, pain with moving your eye, fever.

## 2018-10-29 NOTE — ED Provider Notes (Signed)
[facility] EMERGENCY DEPARTMENT Provider Note   CSN: [id number] Arrival date & time: 10/29/18  1019     History   Chief Complaint Chief Complaint  Patient presents with  . Eye Problem    HPI Tami [name] is a 62 y.o. female with a past medical history of CHF, multiple myeloma currently under chemo, hypertension who presents to ED for 1 week history of bilateral eye irritation, eyelid swelling and clear drainage.  States that symptoms began in one eye and have now progressed to bilateral eyes.  She has tried warm compresses with only mild improvement in her symptoms.  No sick contacts with similar symptoms.  She denies any trauma to the area.  Denies any vision changes, pain with EOMs, fever.  HPI  Past Medical History:  Diagnosis Date  . Abnormal SPEP 07/26/2016  . Arthritis    knees, hands  . Back pain 07/14/2016  . Bowel obstruction ([place]) 11/2014  . C. difficile diarrhea   . CHF (congestive heart failure) ([place])   . Congestive heart failure ([place]) 02/24/2015   S/p pacemaker  . Depression   . Elevated sed rate 08/15/2017  . Epigastric pain 03/22/2017  . GERD (gastroesophageal reflux disease)   . Hyperlipidemia   . Hypertension   . 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 Low back pain 07/14/2016  . 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 Multiple myeloma ([place]) 07/20/2018  . Multiple myeloma not having achieved remission ([place]) 07/20/2018  . Myalgia 12/31/2016  . Obstructive sleep apnea 03/31/2015  . SOB (shortness of breath) 04/09/2016  . Stroke [facility])    TIAs    Patient Active Problem List   Diagnosis Date Noted  . Conjunctivitis 10/17/2018  . Diarrhea 09/04/2018  . Thyroid nodule 08/26/2018  . Drug rash 08/26/2018  . Multiple myeloma ([place]) 07/20/2018  . Multiple myeloma not having achieved remission ([place]) 07/20/2018  . Hematuria 07/11/2018  . Seasonal allergies 06/23/2018  . Right shoulder pain 06/07/2018  . Neck pain 06/07/2018  .  Paresthesias 06/07/2018  . 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 Pain in thoracic spine 03/18/2018  . Thoracic back pain 02/15/2018  . 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 Pneumonia 11/11/2017  . Cystitis 10/25/2017  . C. difficile colitis 09/15/2017  . Hypokalemia 08/20/2017  . Anemia 08/20/2017  . Elevated sed rate 08/15/2017  . 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 Epigastric pain 03/22/2017  . Myalgia 12/31/2016  . SOB (shortness of breath) 07/26/2016  . Abnormal SPEP 07/26/2016  . Low back pain 07/14/2016  . Insomnia 12/29/2015  . Tension headache 10/21/2015  . Muscle spasms of neck 10/18/2015  . Dysuria 10/13/2015  . Sinusitis 07/07/2015  . AP (abdominal pain) 07/07/2015  . Cardiomyopathy ([place]) 04/19/2015  . Chest wall pain 04/19/2015  . Obstructive sleep apnea 03/31/2015  . Congestive heart failure ([place]) 02/24/2015  . Hyperlipidemia   . Hypertension   . Depression   . Stroke ([place])   . GERD (gastroesophageal reflux disease)   . Bowel obstruction ([place]) 11/09/2014    Past Surgical History:  Procedure Laterality Date  . ABDOMINAL HYSTERECTOMY     menorraghia, 2006, total  . CHOLECYSTECTOMY    . KNEE SURGERY     right, repair torn torn cartialage  . PACEMAKER INSERTION  08/2014  . TONSILLECTOMY    . TUBAL LIGATION       OB History   No obstetric history on file.      Home  Medications    Prior to Admission medications   Medication Sig Start Date End Date Taking? Authorizing Provider  acetaminophen (TYLENOL) 325 MG tablet Take 650 mg by mouth. 06/07/17   [provider]  albuterol (PROVENTIL HFA;VENTOLIN HFA) 108 (90 Base) MCG/ACT inhaler Inhale 2 puffs into the lungs every 6 (six) hours as needed for wheezing or shortness of breath. 08/18/18   Saguier, Percell Miller, PA-C  amLODipine (NORVASC) 10 MG tablet Take 1 tablet (10 mg total) by mouth daily. 08/05/18 08/05/19  Mosie Lukes, MD  aspirin EC 81 MG tablet Take 1  tablet (81 mg total) by mouth daily. 02/15/18   Mosie Lukes, MD  carvedilol (COREG) 25 MG tablet Take 1 tablet (25 mg total) by mouth 2 (two) times daily with a meal. 02/15/18   Mosie Lukes, MD  cephALEXin (KEFLEX) 500 MG capsule Take 1 capsule (500 mg total) by mouth 2 (two) times daily for 7 days. 10/29/18 11/05/18  Demetria Iwai, Nicanor Alcon, PA-C  conjugated estrogens (PREMARIN) vaginal cream Place vaginally. 01/31/14   [provider]  cyclobenzaprine (FLEXERIL) 10 MG tablet Take by mouth.    [provider]  dexamethasone (DECADRON) 4 MG tablet Take 5 pills at one time with food ONCE a WEEK!! 08/05/18   [name], MD  dicyclomine (BENTYL) 20 MG tablet Take 1 tablet (20 mg total) by mouth 3 (three) times daily as needed for spasms. 04/14/18   Mosie Lukes, MD  erythromycin ophthalmic ointment Place a 1/2 inch ribbon of ointment into the lower eyelid. 10/29/18   Kelii Chittum, PA-C  famciclovir (FAMVIR) 250 MG tablet Take 1 tablet (250 mg total) by mouth daily. 07/20/18   [name], MD  famotidine (PEPCID) 40 MG/5ML suspension Take 5 mLs (40 mg total) by mouth at bedtime. 09/02/18   Mosie Lukes, MD  fluticasone (FLONASE) 50 MCG/ACT nasal spray Place 2 sprays into both nostrils daily. 08/18/18   Saguier, Percell Miller, PA-C  HYDROcodone-acetaminophen (NORCO/VICODIN) 5-325 MG tablet Take 1 tablet by mouth every 4 (four) hours as needed. 09/02/18   Mosie Lukes, MD  hyoscyamine (LEVSIN SL) 0.125 MG SL tablet Place 1 tablet (0.125 mg total) under the tongue every 4 (four) hours as needed. 07/25/18   Mosie Lukes, MD  lenalidomide (REVLIMID) 20 MG capsule Take 1 pill daily for 3 weeks on and 1 week off.  Take at bedtime [id number] 10/25/18   [name], MD  levocetirizine (XYZAL) 5 MG tablet Take 1 tablet (5 mg total) by mouth every evening. 06/23/18   Shelda Pal, DO  lidocaine (LIDODERM) 5 % Place 1 patch onto the skin daily. Remove & Discard patch within 12  hours or as directed by MD 06/07/18   Jodelle Green, FNP  Multiple Vitamins-Minerals (MULTIVITAMIN ADULT PO) Take by mouth.    [provider]  mupirocin ointment (BACTROBAN) 2 % Apply 1 application topically 2 (two) times daily. 10/17/18   Mosie Lukes, MD  ondansetron (ZOFRAN) 4 MG tablet Take 1 tablet (4 mg total) by mouth every 8 (eight) hours as needed for nausea or vomiting. 04/14/18   Mosie Lukes, MD  pantoprazole (PROTONIX) 40 MG tablet Take 1 tablet (40 mg total) by mouth 2 (two) times daily. 02/15/18   Mosie Lukes, MD  potassium chloride SA (K-DUR,KLOR-CON) 20 MEQ tablet Take 2 tablets (40 mEq total) by mouth once for 1 dose. 09/20/18 09/20/18  Mosie Lukes, MD  potassium chloride SA (  KLOR-CON M20) 20 MEQ tablet Take 1 tablet (20 mEq total) by mouth daily. 09/05/18   Mosie Lukes, MD  prochlorperazine (COMPAZINE) 10 MG tablet Take 1 tablet (10 mg total) by mouth every 6 (six) hours as needed (Nausea or vomiting). 08/05/18   [name], MD  sucralfate (CARAFATE) 1 g tablet Take 1 tablet (1 g total) by mouth 4 (four) times daily -  with meals and at bedtime. 06/29/18   Saguier, Percell Miller, PA-C  topiramate (TOPAMAX) 25 MG tablet Take 1 tablet (25 mg total) by mouth 2 (two) times daily. 06/07/18   Mosie Lukes, MD  trimethoprim-polymyxin b (POLYTRIM) ophthalmic solution Place 1 drop into both eyes 3 (three) times daily as needed. 10/17/18   Mosie Lukes, MD    Family History Family History  Problem Relation Age of Onset  . Diabetes Mother   . Hypertension Mother   . Heart disease Mother        s/p 1 stent  . Hyperlipidemia Mother   . Arthritis Mother   . Cancer Father        COLON  . Colon cancer Father 33  . Irritable bowel syndrome Sister   . Hyperlipidemia Daughter   . Hypertension Daughter   . Hypertension Maternal Grandmother   . Arthritis Maternal Grandmother   . Heart disease Maternal Grandfather        MI  . Hypertension Maternal Grandfather     . Arthritis Maternal Grandfather   . Hypertension Son     Social History Social History   Tobacco Use  . Smoking status: Never Smoker  . Smokeless tobacco: Never Used  Substance Use Topics  . Alcohol use: No    Alcohol/week: 0.0 standard drinks  . Drug use: No     Allergies   Benazepril; Ondansetron hcl; Codeine; and Morphine   Review of Systems Review of Systems  Constitutional: Negative for fever.  HENT: Negative for facial swelling.   Eyes: Positive for discharge, redness and itching. Negative for photophobia, pain and visual disturbance.     Physical Exam Updated Vital Signs BP (!) 151/87 (BP Location: Left Arm)   Pulse 75   Temp 98.2 F (36.8 C) (Oral)   Resp 18   Ht _0  (1.702 m)   Wt 84.4 kg   SpO2 99%   BMI 29.13 kg/m   Physical Exam Vitals signs and nursing note reviewed.  Constitutional:      General: She is not in acute distress.    Appearance: She is well-developed. She is not diaphoretic.  HENT:     Head: Normocephalic and atraumatic.  Eyes:     General: No scleral icterus.       Right eye: Hordeolum present.        Left eye: Hordeolum present.    Extraocular Movements:     Right eye: Normal extraocular motion.     Left eye: Normal extraocular motion.     Conjunctiva/sclera:     Right eye: Right conjunctiva is injected.     Left eye: Left conjunctiva is injected.     Pupils: Pupils are equal, round, and reactive to light.     Comments: Bilateral eyes with injected conjunctiva, mild eyelid swelling; no erythema or tenderness to palpation.  Mild clear tearful drainage noted.  No foreign bodies noted.  No pain with EOMs.  No chemosis, proptosis, or consensual photophobia.  Neck:     Musculoskeletal: Normal range of motion.  Pulmonary:  Effort: Pulmonary effort is normal. No respiratory distress.  Skin:    Findings: No rash.  Neurological:     Mental Status: She is alert.      ED Treatments / Results  Labs (all labs ordered  are listed, but only abnormal results are displayed) Labs Reviewed - No data to display  EKG None  Radiology No results found.  Procedures Procedures (including critical care time)  Medications Ordered in ED Medications - No data to display   Initial Impression / Assessment and Plan / ED Course  I have reviewed the triage vital signs and the nursing notes.  Pertinent labs & imaging results that were available during my care of the patient were reviewed by me and considered in my medical decision making (see chart for details).     62 year old female presents to ED for 1 week history of bilateral eye irritation, eyelid swelling, and tearing.  States that symptoms began unilaterally and have become bilateral.  She has used alcohol in the area which made symptoms worse.  Denies any blurry vision, pain with EOMs, fever. On exam there are bilateral styes noted. EOMs are intact. She denies any trauma in the area or foreign body sensation.  She is afebrile.  Suspect that her symptoms are due to conjunctivitis.  Because she is high risk with recent chemo treatment will treat with oral antibiotics and antibiotic ointment for her eye. Doubt orbital cellulitis, iritis, keratitis or other emergent cause of symptoms. Encouraged follow-up with ophthalmologist and to return to ED for any severe worsening symptoms. Patient discussed with and seen by my attending, Dr. Ellender Hose.  Patient is hemodynamically stable, in NAD, and able to ambulate in the ED. Evaluation does not show pathology that would require ongoing emergent intervention or inpatient treatment. I explained the diagnosis to the patient. Pain has been managed and has no complaints prior to discharge. Patient is comfortable with above plan and is stable for discharge at this time. All questions were answered prior to disposition. Strict return precautions for returning to the ED were discussed. Encouraged follow up with PCP.    Portions of this  note were generated with [employer]. Dictation errors may occur despite best attempts at proofreading.   Final Clinical Impressions(s) / ED Diagnoses   Final diagnoses:  Conjunctivitis of both eyes, unspecified conjunctivitis type  Hordeolum externum of left upper eyelid  Hordeolum externum of right upper eyelid    ED Discharge Orders         Ordered    erythromycin ophthalmic ointment     10/29/18 1134    cephALEXin (KEFLEX) 500 MG capsule  2 times daily     10/29/18 1134           Delia Heady, PA-C 10/29/18 1138    Duffy Bruce, MD 10/30/18 [id number]

## 2018-10-29 NOTE — ED Triage Notes (Addendum)
Pt reports bilateral eye irritation, drainage and itching x 2 weeks. Pt received chemo yesterday for multiple myeloma

## 2018-10-29 NOTE — ED Notes (Addendum)
Pt states she is having pain, itching and redness bilaterally to both eyes for approx. 1 1/2 weeks. She states she is experiencing photosensitivity and she says it "feels like a film" is over her eyes. She does have bilateral swelling to eyelids. Pt denies vision impairment at this time, but states she does have some blurry vision intermittently. Pt states she does have history of allergies.

## 2018-10-31 LAB — KAPPA/LAMBDA LIGHT CHAINS
Kappa free light chain: 38.9 mg/L — ABNORMAL HIGH (ref 3.3–19.4)
Kappa, lambda light chain ratio: 3.97 — ABNORMAL HIGH (ref 0.26–1.65)
Lambda free light chains: 9.8 mg/L (ref 5.7–26.3)

## 2018-11-04 ENCOUNTER — Inpatient Hospital Stay: Payer: Medicare Other

## 2018-11-04 ENCOUNTER — Telehealth: Payer: Self-pay | Admitting: Family

## 2018-11-04 ENCOUNTER — Ambulatory Visit: Payer: Self-pay | Admitting: Family

## 2018-11-04 DIAGNOSIS — Z5112 Encounter for antineoplastic immunotherapy: Secondary | ICD-10-CM | POA: Diagnosis not present

## 2018-11-04 DIAGNOSIS — C9000 Multiple myeloma not having achieved remission: Secondary | ICD-10-CM | POA: Diagnosis not present

## 2018-11-04 LAB — CMP (CANCER CENTER ONLY)
ALT: 13 U/L (ref 0–44)
AST: 21 U/L (ref 15–41)
Albumin: 3.5 g/dL (ref 3.5–5.0)
Alkaline Phosphatase: 35 U/L — ABNORMAL LOW (ref 38–126)
Anion gap: 7 (ref 5–15)
BUN: 10 mg/dL (ref 8–23)
CO2: 27 mmol/L (ref 22–32)
Calcium: 8.6 mg/dL — ABNORMAL LOW (ref 8.9–10.3)
Chloride: 108 mmol/L (ref 98–111)
Creatinine: 0.68 mg/dL (ref 0.44–1.00)
GFR, Est AFR Am: >60 mL/min (ref >60)
GFR, Estimated: >60 mL/min (ref >60)
Glucose, Bld: 113 mg/dL — ABNORMAL HIGH (ref 70–99)
Potassium: 3.2 mmol/L — ABNORMAL LOW (ref 3.5–5.1)
SODIUM: 142 mmol/L (ref 135–145)
Total Bilirubin: 0.6 mg/dL (ref 0.3–1.2)
Total Protein: 7.6 g/dL (ref 6.5–8.1)

## 2018-11-04 LAB — PROTEIN ELECTROPHORESIS, SERUM, WITH REFLEX
A/G Ratio: 0.8 (ref 0.7–1.7)
Albumin ELP: 3.5 g/dL (ref 2.9–4.4)
Alpha-1-Globulin: 0.3 g/dL (ref 0.0–0.4)
Alpha-2-Globulin: 0.9 g/dL (ref 0.4–1.0)
Beta Globulin: 1.1 g/dL (ref 0.7–1.3)
Gamma Globulin: 2 g/dL — ABNORMAL HIGH (ref 0.4–1.8)
Globulin, Total: 4.3 g/dL — ABNORMAL HIGH (ref 2.2–3.9)
M-Spike, %: 0.9 g/dL — ABNORMAL HIGH
SPEP Interpretation: 0
TOTAL PROTEIN ELP: 7.8 g/dL (ref 6.0–8.5)

## 2018-11-04 LAB — CBC WITH DIFFERENTIAL (CANCER CENTER ONLY)
Abs Immature Granulocytes: 0.01 10*3/uL (ref 0.00–0.07)
BASOS PCT: 1 %
Basophils Absolute: 0 10*3/uL (ref 0.0–0.1)
Eosinophils Absolute: 0.2 10*3/uL (ref 0.0–0.5)
Eosinophils Relative: 13 %
HCT: 34.4 % — ABNORMAL LOW (ref 36.0–46.0)
Hemoglobin: 10.5 g/dL — ABNORMAL LOW (ref 12.0–15.0)
Immature Granulocytes: 1 %
Lymphocytes Relative: 32 %
Lymphs Abs: 0.6 10*3/uL — ABNORMAL LOW (ref 0.7–4.0)
MCH: 27.5 pg (ref 26.0–34.0)
MCHC: 30.5 g/dL (ref 30.0–36.0)
MCV: 90.1 fL (ref 80.0–100.0)
Monocytes Absolute: 0.2 10*3/uL (ref 0.1–1.0)
Monocytes Relative: 10 %
NEUTROS PCT: 43 %
Neutro Abs: 0.8 10*3/uL — ABNORMAL LOW (ref 1.7–7.7)
PLATELETS: 85 10*3/uL — AB (ref 150–400)
RBC: 3.82 MIL/uL — ABNORMAL LOW (ref 3.87–5.11)
RDW: 15.7 % — ABNORMAL HIGH (ref 11.5–15.5)
WBC Count: 1.8 10*3/uL — ABNORMAL LOW (ref 4.0–10.5)
nRBC: 0 % (ref 0.0–0.2)

## 2018-11-04 LAB — IMMUNOFIXATION REFLEX, SERUM
IgA: 225 mg/dL (ref 87–352)
IgG (Immunoglobin G), Serum: 3215 mg/dL — ABNORMAL HIGH (ref 700–1600)
IgM (Immunoglobulin M), Srm: 127 mg/dL (ref 26–217)

## 2018-11-04 NOTE — Telephone Encounter (Signed)
Spoke with patient regarding appointment for iron per 12/26 sch msg

## 2018-11-04 NOTE — Progress Notes (Signed)
Per Dr Marin Olp, hold today's treatment d/t neutropenia and thrombocytopenia. Pt aware and verbalizes understanding. Aware to keep appt as scheduled for 1/3. Neutropenic precautions reviewed. dph

## 2018-11-07 ENCOUNTER — Telehealth: Payer: Self-pay | Admitting: *Deleted

## 2018-11-07 NOTE — Telephone Encounter (Addendum)
Message left on patient's voice mail  ----- Message from [name], MD sent at 11/04/2018 12:20 PM EST ----- Call - myeloma level is now down to 0.9!!!!  Great way to end the year!!  [name]

## 2018-11-10 DIAGNOSIS — H0015 Chalazion left lower eyelid: Secondary | ICD-10-CM | POA: Diagnosis not present

## 2018-11-11 ENCOUNTER — Inpatient Hospital Stay: Payer: Medicare Other

## 2018-11-11 ENCOUNTER — Other Ambulatory Visit: Payer: Self-pay

## 2018-11-11 ENCOUNTER — Inpatient Hospital Stay: Payer: Medicare Other | Attending: Hematology & Oncology

## 2018-11-11 VITALS — BP 129/61 | HR 83 | Temp 98.1°F | Resp 18

## 2018-11-11 DIAGNOSIS — C9000 Multiple myeloma not having achieved remission: Secondary | ICD-10-CM | POA: Insufficient documentation

## 2018-11-11 DIAGNOSIS — Z5112 Encounter for antineoplastic immunotherapy: Secondary | ICD-10-CM | POA: Diagnosis not present

## 2018-11-11 DIAGNOSIS — K529 Noninfective gastroenteritis and colitis, unspecified: Secondary | ICD-10-CM | POA: Diagnosis not present

## 2018-11-11 LAB — CBC WITH DIFFERENTIAL (CANCER CENTER ONLY)
Abs Immature Granulocytes: 0.01 10*3/uL (ref 0.00–0.07)
Basophils Absolute: 0.1 10*3/uL (ref 0.0–0.1)
Basophils Relative: 3 %
EOS PCT: 5 %
Eosinophils Absolute: 0.1 10*3/uL (ref 0.0–0.5)
HCT: 36.2 % (ref 36.0–46.0)
Hemoglobin: 11.1 g/dL — ABNORMAL LOW (ref 12.0–15.0)
Immature Granulocytes: 0 %
Lymphocytes Relative: 37 %
Lymphs Abs: 0.9 10*3/uL (ref 0.7–4.0)
MCH: 28.1 pg (ref 26.0–34.0)
MCHC: 30.7 g/dL (ref 30.0–36.0)
MCV: 91.6 fL (ref 80.0–100.0)
MONO ABS: 0.3 10*3/uL (ref 0.1–1.0)
Monocytes Relative: 11 %
Neutro Abs: 1.1 10*3/uL — ABNORMAL LOW (ref 1.7–7.7)
Neutrophils Relative %: 44 %
Platelet Count: 209 10*3/uL (ref 150–400)
RBC: 3.95 MIL/uL (ref 3.87–5.11)
RDW: 15.7 % — ABNORMAL HIGH (ref 11.5–15.5)
WBC Count: 2.5 10*3/uL — ABNORMAL LOW (ref 4.0–10.5)
nRBC: 0 % (ref 0.0–0.2)

## 2018-11-11 LAB — CMP (CANCER CENTER ONLY)
ALT: 12 U/L (ref 0–44)
AST: 17 U/L (ref 15–41)
Albumin: 3.7 g/dL (ref 3.5–5.0)
Alkaline Phosphatase: 42 U/L (ref 38–126)
Anion gap: 3 — ABNORMAL LOW (ref 5–15)
BUN: 18 mg/dL (ref 8–23)
CALCIUM: 9.2 mg/dL (ref 8.9–10.3)
CO2: 27 mmol/L (ref 22–32)
Chloride: 109 mmol/L (ref 98–111)
Creatinine: 0.71 mg/dL (ref 0.44–1.00)
GFR, Est AFR Am: >60 mL/min (ref >60)
Glucose, Bld: 97 mg/dL (ref 70–99)
Potassium: 4.2 mmol/L (ref 3.5–5.1)
Sodium: 139 mmol/L (ref 135–145)
Total Bilirubin: 0.5 mg/dL (ref 0.3–1.2)
Total Protein: 8.2 g/dL — ABNORMAL HIGH (ref 6.5–8.1)

## 2018-11-11 MED ORDER — PROCHLORPERAZINE MALEATE 10 MG PO TABS
10.0000 mg | ORAL_TABLET | Freq: Once | ORAL | Status: AC
  Administered 2018-11-11: 10 mg via ORAL

## 2018-11-11 MED ORDER — PROCHLORPERAZINE MALEATE 10 MG PO TABS
ORAL_TABLET | ORAL | Status: AC
  Filled 2018-11-11: qty 1

## 2018-11-11 MED ORDER — BORTEZOMIB CHEMO SQ INJECTION 3.5 MG (2.5MG/ML)
1.3000 mg/m2 | Freq: Once | INTRAMUSCULAR | Status: AC
  Administered 2018-11-11: 2.5 mg via SUBCUTANEOUS
  Filled 2018-11-11: qty 1

## 2018-11-11 NOTE — Progress Notes (Signed)
Ok to treat with ANC 1.1 per Dr Ennever 

## 2018-11-21 ENCOUNTER — Ambulatory Visit (INDEPENDENT_AMBULATORY_CARE_PROVIDER_SITE_OTHER): Payer: Medicare Other | Admitting: Family Medicine

## 2018-11-21 VITALS — BP 122/70 | HR 83 | Temp 97.9°F | Resp 18 | Wt 184.8 lb

## 2018-11-21 DIAGNOSIS — I10 Essential (primary) hypertension: Secondary | ICD-10-CM

## 2018-11-21 DIAGNOSIS — C9000 Multiple myeloma not having achieved remission: Secondary | ICD-10-CM

## 2018-11-21 DIAGNOSIS — A0472 Enterocolitis due to Clostridium difficile, not specified as recurrent: Secondary | ICD-10-CM | POA: Diagnosis not present

## 2018-11-21 DIAGNOSIS — R197 Diarrhea, unspecified: Secondary | ICD-10-CM

## 2018-11-21 DIAGNOSIS — K2100 Gastro-esophageal reflux disease with esophagitis, without bleeding: Secondary | ICD-10-CM

## 2018-11-21 DIAGNOSIS — R109 Unspecified abdominal pain: Secondary | ICD-10-CM

## 2018-11-21 MED ORDER — HYDROCODONE-ACETAMINOPHEN 5-325 MG PO TABS: 1.0000 | ORAL_TABLET | ORAL | 0 refills | Status: DC | PRN

## 2018-11-21 NOTE — Patient Instructions (Signed)
Diarrhea, Adult  Diarrhea is frequent loose and watery bowel movements. Diarrhea can make you feel weak and cause you to become dehydrated. Dehydration can make you tired and thirsty, cause you to have a dry mouth, and decrease how often you urinate.  Diarrhea typically lasts 2-3 days. However, it can last longer if it is a sign of something more serious. It is important to treat your diarrhea as told by your health care provider.  Follow these instructions at home:  Eating and drinking         Follow these recommendations as told by your health care provider:  · Take an oral rehydration solution (ORS). This is an over-the-counter medicine that helps return your body to its normal balance of nutrients and water. It is found at pharmacies and retail stores.  · Drink plenty of fluids, such as water, ice chips, diluted fruit juice, and low-calorie sports drinks. You can drink milk also, if desired.  · Avoid drinking fluids that contain a lot of sugar or caffeine, such as energy drinks, sports drinks, and soda.  · Eat bland, easy-to-digest foods in small amounts as you are able. These foods include bananas, applesauce, rice, lean meats, toast, and crackers.  · Avoid alcohol.  · Avoid spicy or fatty foods.    Medicines  · Take over-the-counter and prescription medicines only as told by your health care provider.  · If you were prescribed an antibiotic medicine, take it as told by your health care provider. Do not stop using the antibiotic even if you start to feel better.  General instructions    · Wash your hands often using soap and water. If soap and water are not available, use a hand sanitizer. Others in the household should wash their hands as well. Hands should be washed:  ? After using the toilet or changing a diaper.  ? Before preparing, cooking, or serving food.  ? While caring for a sick person or while visiting someone in a hospital.  · Drink enough fluid to keep your urine pale yellow.  · Rest at home while  you recover.  · Watch your condition for any changes.  · Take a warm bath to relieve any burning or pain from frequent diarrhea episodes.  · Keep all follow-up visits as told by your health care provider. This is important.  Contact a health care provider if:  · You have a fever.  · Your diarrhea gets worse.  · You have new symptoms.  · You cannot keep fluids down.  · You feel light-headed or dizzy.  · You have a headache.  · You have muscle cramps.  Get help right away if:  · You have chest pain.  · You feel extremely weak or you faint.  · You have bloody or black stools or stools that look like tar.  · You have severe pain, cramping, or bloating in your abdomen.  · You have trouble breathing or you are breathing very quickly.  · Your heart is beating very quickly.  · Your skin feels cold and clammy.  · You feel confused.  · You have signs of dehydration, such as:  ? Dark urine, very little urine, or no urine.  ? Cracked lips.  ? Dry mouth.  ? Sunken eyes.  ? Sleepiness.  ? Weakness.  Summary  · Diarrhea is frequent loose and watery bowel movements. Diarrhea can make you feel weak and cause you to become dehydrated.  · Drink enough fluids   to keep your urine pale yellow.  · Make sure that you wash your hands after using the toilet. If soap and water are not available, use hand sanitizer.  · Contact a health care provider if your diarrhea gets worse or you have new symptoms.  · Get help right away if you have signs of dehydration.  This information is not intended to replace advice given to you by your health care provider. Make sure you discuss any questions you have with your health care provider.  Document Released: 10/16/2002 Document Revised: 04/01/2018 Document Reviewed: 04/01/2018  Elsevier Interactive Patient Education © 2019 [employer]

## 2018-11-22 ENCOUNTER — Other Ambulatory Visit: Payer: Medicare Other

## 2018-11-22 DIAGNOSIS — R109 Unspecified abdominal pain: Secondary | ICD-10-CM | POA: Diagnosis not present

## 2018-11-22 DIAGNOSIS — A0472 Enterocolitis due to Clostridium difficile, not specified as recurrent: Secondary | ICD-10-CM

## 2018-11-22 DIAGNOSIS — I10 Essential (primary) hypertension: Secondary | ICD-10-CM

## 2018-11-22 DIAGNOSIS — J40 Bronchitis, not specified as acute or chronic: Secondary | ICD-10-CM | POA: Diagnosis not present

## 2018-11-22 LAB — COMPREHENSIVE METABOLIC PANEL
ALT: 12 U/L (ref 0–35)
AST: 16 U/L (ref 0–37)
Albumin: 3.4 g/dL — ABNORMAL LOW (ref 3.5–5.2)
Alkaline Phosphatase: 40 U/L (ref 39–117)
BUN: 12 mg/dL (ref 6–23)
CHLORIDE: 107 meq/L (ref 96–112)
CO2: 27 meq/L (ref 19–32)
Calcium: 8.8 mg/dL (ref 8.4–10.5)
Creatinine, Ser: 0.67 mg/dL (ref 0.40–1.20)
GFR: 113.79 mL/min (ref >60.00)
GLUCOSE: 94 mg/dL (ref 70–99)
Potassium: 3.3 mEq/L — ABNORMAL LOW (ref 3.5–5.1)
Sodium: 142 mEq/L (ref 135–145)
Total Bilirubin: 0.5 mg/dL (ref 0.2–1.2)
Total Protein: 7.3 g/dL (ref 6.0–8.3)

## 2018-11-22 LAB — CBC WITH DIFFERENTIAL/PLATELET
Basophils Absolute: 0 10*3/uL (ref 0.0–0.1)
Basophils Relative: 1 % (ref 0.0–3.0)
Eosinophils Absolute: 0.2 10*3/uL (ref 0.0–0.7)
Eosinophils Relative: 6.9 % — ABNORMAL HIGH (ref 0.0–5.0)
HCT: 32.6 % — ABNORMAL LOW (ref 36.0–46.0)
Hemoglobin: 10.7 g/dL — ABNORMAL LOW (ref 12.0–15.0)
LYMPHS ABS: 1 10*3/uL (ref 0.7–4.0)
Lymphocytes Relative: 39.1 % (ref 12.0–46.0)
MCHC: 33 g/dL (ref 30.0–36.0)
MCV: 86.5 fl (ref 78.0–100.0)
Monocytes Absolute: 0.3 10*3/uL (ref 0.1–1.0)
Monocytes Relative: 10.1 % (ref 3.0–12.0)
NEUTROS PCT: 42.9 % — AB (ref 43.0–77.0)
Neutro Abs: 1.1 10*3/uL — ABNORMAL LOW (ref 1.4–7.7)
Platelets: 110 10*3/uL — ABNORMAL LOW (ref 150.0–400.0)
RBC: 3.77 Mil/uL — ABNORMAL LOW (ref 3.87–5.11)
RDW: 17.1 % — AB (ref 11.5–15.5)
WBC: 2.5 10*3/uL — ABNORMAL LOW (ref 4.0–10.5)

## 2018-11-22 LAB — SEDIMENTATION RATE: Sed Rate: 59 mm/hr — ABNORMAL HIGH (ref 0–30)

## 2018-11-22 LAB — LIPASE: Lipase: 37 U/L (ref 11.0–59.0)

## 2018-11-22 LAB — AMYLASE: Amylase: 110 U/L (ref 27–131)

## 2018-11-22 LAB — C.DIFFICILE TOXIN: C. Difficile Toxin A: NOT DETECTED

## 2018-11-23 DIAGNOSIS — N281 Cyst of kidney, acquired: Secondary | ICD-10-CM | POA: Diagnosis not present

## 2018-11-23 DIAGNOSIS — R1084 Generalized abdominal pain: Secondary | ICD-10-CM | POA: Diagnosis not present

## 2018-11-23 DIAGNOSIS — K529 Noninfective gastroenteritis and colitis, unspecified: Secondary | ICD-10-CM | POA: Diagnosis not present

## 2018-11-23 DIAGNOSIS — R197 Diarrhea, unspecified: Secondary | ICD-10-CM | POA: Diagnosis not present

## 2018-11-23 DIAGNOSIS — R110 Nausea: Secondary | ICD-10-CM | POA: Diagnosis not present

## 2018-11-23 NOTE — Assessment & Plan Note (Signed)
Following with oncology and does not feel her diarrhea correlates with her tratments

## 2018-11-23 NOTE — Assessment & Plan Note (Signed)
Well controlled, no changes to meds. Encouraged heart healthy diet such as the DASH diet and exercise as tolerated.  °

## 2018-11-23 NOTE — Progress Notes (Signed)
Subjective:    [name], female    DOB: [birth year], 63 y.o.   MRN: [id number]  No chief complaint on file.   HPI Patient is in today for evaluation of diarrhea. She has had a flare in her diarrhea for about 3 weeks now. She denies any recent antibiotic use. No fevers or blood in the stool. Notes some nausea but no vomiting. She is noting abdominal cramping especailly after eating. She has diarrhea almost immediately. No change in meds or diet. Denies CP/palp/SOB/HA/congestion/fevers or GU c/o. Taking meds as prescribed  Past Medical History:  Diagnosis Date  . Abnormal SPEP 07/26/2016  . Arthritis    knees, hands  . Back pain 07/14/2016  . Bowel obstruction ([place]) 11/2014  . C. difficile diarrhea   . CHF (congestive heart failure) ([place])   . Congestive heart failure ([place]) 02/24/2015   S/p pacemaker  . Depression   . Elevated sed rate 08/15/2017  . Epigastric pain 03/22/2017  . GERD (gastroesophageal reflux disease)   . Hyperlipidemia   . Hypertension   . 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 Low back pain 07/14/2016  . 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 Multiple myeloma ([place]) 07/20/2018  . Multiple myeloma not having achieved remission ([place]) 07/20/2018  . Myalgia 12/31/2016  . Obstructive sleep apnea 03/31/2015  . SOB (shortness of breath) 04/09/2016  . Stroke [facility])    TIAs    Past Surgical History:  Procedure Laterality Date  . ABDOMINAL HYSTERECTOMY     menorraghia, 2006, total  . CHOLECYSTECTOMY    . KNEE SURGERY     right, repair torn torn cartialage  . PACEMAKER INSERTION  08/2014  . TONSILLECTOMY    . TUBAL LIGATION      Family History  Problem Relation Age of Onset  . Diabetes Mother   . Hypertension Mother   . Heart disease Mother        s/p 1 stent  . Hyperlipidemia Mother   . Arthritis Mother   . Cancer Father        COLON  . Colon cancer Father 40  . Irritable bowel syndrome Sister   . Hyperlipidemia  Daughter   . Hypertension Daughter   . Hypertension Maternal Grandmother   . Arthritis Maternal Grandmother   . Heart disease Maternal Grandfather        MI  . Hypertension Maternal Grandfather   . Arthritis Maternal Grandfather   . Hypertension Son     Social History   Socioeconomic History  . Marital status: Divorced    Spouse name: [name]  . Number of children: 3  . Years of education: [name]  . Highest education level: [name]  Occupational History  . [name]  Social Needs  . Financial resource strain: [name]  . Food insecurity:    Worry: [name]    Inability: [name]  . Transportation needs:    Medical: [name]    Non-medical: [name]  Tobacco Use  . Smoking status: Never Smoker  . Smokeless tobacco: Never Used  Substance and Sexual Activity  . Alcohol use: No    Alcohol/week: 0.0 standard drinks  . Drug use: No  . Sexual activity: [name]  Lifestyle  . Physical activity:    Days per week: [name]    Minutes per session: [name]  . Stress: [name]  Relationships  . Social connections:    Talks on phone: [name]    Gets together: [name]    Attends religious service: [name]    Active member of club or organization: [name]    Attends meetings of clubs or organizations: [name]    Relationship status: [name]  . Intimate partner violence:    Fear of current or ex partner: [name]    Emotionally abused: [name]    Physically abused: [name]    Forced sexual activity: [name]  Other Topics Concern  . [name]  Social History Narrative  . [name]    Outpatient Medications Prior to Visit  Medication Sig Dispense Refill  . acetaminophen (TYLENOL) 325 MG tablet Take 650 mg by mouth.    Marland Kitchen albuterol (PROVENTIL HFA;VENTOLIN HFA) 108 (90 Base) MCG/ACT inhaler Inhale 2 puffs into the lungs every 6 (six) hours as needed for wheezing or shortness of breath. 1  Inhaler 2  . amLODipine (NORVASC) 10 MG tablet Take 1 tablet (10 mg total) by mouth daily. 90 tablet 1  . aspirin EC 81 MG tablet Take 1 tablet (81 mg total) by mouth daily. 90 tablet 1  . carvedilol (COREG) 25 MG tablet Take 1 tablet (25 mg total) by mouth 2 (two) times daily with a meal. 180 tablet 1  . conjugated estrogens (PREMARIN) vaginal cream Place vaginally.    . cyclobenzaprine (FLEXERIL) 10 MG tablet Take by mouth.    . dexamethasone (DECADRON) 4 MG tablet Take 5 pills at one time with food ONCE a WEEK!! 100 tablet 2  . dicyclomine (BENTYL) 20 MG tablet Take 1 tablet (20 mg total) by mouth 3 (three) times daily as needed for spasms. 90 tablet 1  . erythromycin ophthalmic ointment Place a 1/2 inch ribbon of ointment into the lower eyelid. 1 g 0  . famciclovir (FAMVIR) 250 MG tablet Take 1 tablet (250 mg total) by mouth daily. 30 tablet 12  . famotidine (PEPCID) 40 MG/5ML suspension Take 5 mLs (40 mg total) by mouth at bedtime. 150 mL 5  . fluticasone (FLONASE) 50 MCG/ACT nasal spray Place 2 sprays into both nostrils daily. 16 g 1  . hyoscyamine (LEVSIN SL) 0.125 MG SL tablet Place 1 tablet (0.125 mg total) under the tongue every 4 (four) hours as needed. 30 tablet 2  . lenalidomide (REVLIMID) 20 MG capsule Take 1 pill daily for 3 weeks on and 1 week off.  Take at bedtime [id number] 21 capsule 0  . levocetirizine (XYZAL) 5 MG tablet Take 1 tablet (5 mg total) by mouth every evening. 30 tablet 2  . lidocaine (LIDODERM) 5 % Place 1 patch onto the skin daily. Remove & Discard patch within 12 hours or as directed by MD 30 patch 2  . Multiple Vitamins-Minerals (MULTIVITAMIN ADULT PO) Take by mouth.    . mupirocin ointment (BACTROBAN) 2 % Apply 1 application topically 2 (two) times daily. 22 g 0  . ondansetron (ZOFRAN) 4 MG tablet Take 1 tablet (4 mg total) by mouth every 8 (eight) hours as needed for nausea or vomiting. 30 tablet 1  . pantoprazole (PROTONIX) 40 MG tablet Take 1 tablet (40 mg  total) by mouth 2 (two) times daily. 180 tablet 1  . potassium chloride SA (K-DUR,KLOR-CON) 20 MEQ tablet Take 2 tablets (40 mEq total) by mouth once for 1 dose. 60 tablet 3  .  potassium chloride SA (KLOR-CON M20) 20 MEQ tablet Take 1 tablet (20 mEq total) by mouth daily. 90 tablet 1  . prochlorperazine (COMPAZINE) 10 MG tablet Take 1 tablet (10 mg total) by mouth every 6 (six) hours as needed (Nausea or vomiting). 30 tablet 1  . sucralfate (CARAFATE) 1 g tablet Take 1 tablet (1 g total) by mouth 4 (four) times daily -  with meals and at bedtime. 120 tablet 0  . topiramate (TOPAMAX) 25 MG tablet Take 1 tablet (25 mg total) by mouth 2 (two) times daily. 180 tablet 0  . trimethoprim-polymyxin b (POLYTRIM) ophthalmic solution Place 1 drop into both eyes 3 (three) times daily as needed. 10 mL 0  . HYDROcodone-acetaminophen (NORCO/VICODIN) 5-325 MG tablet Take 1 tablet by mouth every 4 (four) hours as needed. 15 tablet 0   No facility-administered medications prior to visit.     Allergies  Allergen Reactions  . Benazepril Anaphylaxis and Swelling    angioedema Throat and lip swelling  . Ondansetron Hcl Hives    Redness and hives post IV admin on 07/05/17  . Codeine Nausea And Vomiting  . Morphine Hives    Redness and hives noted post IV admin on 07/05/17    Review of Systems  Constitutional: Positive for chills and malaise/fatigue. Negative for fever.  HENT: Negative for congestion.   Eyes: Negative for blurred vision.  Respiratory: Negative for shortness of breath.   Cardiovascular: Negative for chest pain, palpitations and leg swelling.  Gastrointestinal: Positive for abdominal pain, diarrhea and nausea. Negative for blood in stool, constipation and vomiting.  Genitourinary: Negative for dysuria and frequency.  Musculoskeletal: Negative for falls.  Skin: Negative for rash.  Neurological: Negative for dizziness, loss of consciousness and headaches.  Endo/Heme/Allergies: Negative for  environmental allergies.  Psychiatric/Behavioral: Negative for depression. The patient is not nervous/anxious.        Objective:    Physical Exam Vitals signs and nursing note reviewed.  Constitutional:      General: She is not in acute distress.    Appearance: She is well-developed.  HENT:     Head: Normocephalic and atraumatic.     Nose: Nose normal.  Eyes:     General:        Right eye: No discharge.        Left eye: No discharge.  Neck:     Musculoskeletal: Normal range of motion and neck supple.  Cardiovascular:     Rate and Rhythm: Normal rate and regular rhythm.     Heart sounds: No murmur.  Pulmonary:     Effort: Pulmonary effort is normal.     Breath sounds: Normal breath sounds.  Abdominal:     General: Bowel sounds are normal. There is no distension.     Palpations: Abdomen is soft.     Tenderness: There is abdominal tenderness. There is no guarding or rebound.  Skin:    General: Skin is warm and dry.  Neurological:     Mental Status: She is alert and oriented to person, place, and time.     BP 122/70 (BP Location: Left Arm, Patient Position: Sitting, Cuff Size: Normal)   Pulse 83   Temp 97.9 F (36.6 C) (Oral)   Resp 18   Wt 184 lb 12.8 oz (83.8 kg)   SpO2 97%   BMI 28.94 kg/m  Wt Readings from Last 3 Encounters:  11/21/18 184 lb 12.8 oz (83.8 kg)  10/29/18 186 lb (84.4 kg)  10/28/18 181 lb (  82.1 kg)     Lab Results  Component Value Date   WBC 2.5 (L) 11/21/2018   HGB 10.7 (L) 11/21/2018   HCT 32.6 (L) 11/21/2018   PLT 110.0 (L) 11/21/2018   GLUCOSE 94 11/21/2018   CHOL 137 02/15/2018   TRIG 100.0 02/15/2018   HDL 45.90 02/15/2018   LDLCALC 72 02/15/2018   ALT 12 11/21/2018   AST 16 11/21/2018   NA 142 11/21/2018   K 3.3 (L) 11/21/2018   CL 107 11/21/2018   CREATININE 0.67 11/21/2018   BUN 12 11/21/2018   CO2 27 11/21/2018   TSH 1.79 02/15/2018   INR 0.90 07/28/2018    Lab Results  Component Value Date   TSH 1.79 02/15/2018    Lab Results  Component Value Date   WBC 2.5 (L) 11/21/2018   HGB 10.7 (L) 11/21/2018   HCT 32.6 (L) 11/21/2018   MCV 86.5 11/21/2018   PLT 110.0 (L) 11/21/2018   Lab Results  Component Value Date   NA 142 11/21/2018   K 3.3 (L) 11/21/2018   CHLORIDE 108 12/31/2016   CO2 27 11/21/2018   GLUCOSE 94 11/21/2018   BUN 12 11/21/2018   CREATININE 0.67 11/21/2018   BILITOT 0.5 11/21/2018   ALKPHOS 40 11/21/2018   AST 16 11/21/2018   ALT 12 11/21/2018   PROT 7.3 11/21/2018   ALBUMIN 3.4 (L) 11/21/2018   CALCIUM 8.8 11/21/2018   ANIONGAP 3 (L) 11/11/2018   EGFR >90 12/31/2016   GFR 113.79 11/21/2018   Lab Results  Component Value Date   CHOL 137 02/15/2018   Lab Results  Component Value Date   HDL 45.90 02/15/2018   Lab Results  Component Value Date   LDLCALC 72 02/15/2018   Lab Results  Component Value Date   TRIG 100.0 02/15/2018   Lab Results  Component Value Date   CHOLHDL 3 02/15/2018   No results found for: HGBA1C     Assessment & Plan:   Problem List Items Addressed This Visit    Multiple myeloma ([place]) (Chronic)    Following with oncology and does not feel her diarrhea correlates with her tratments      GERD (gastroesophageal reflux disease)    Avoid offending foods, start probiotics. Do not eat large meals in late evening and consider raising head of bed.       Hypertension - Primary    Well controlled, no changes to meds. Encouraged heart healthy diet such as the DASH diet and exercise as tolerated.       Relevant Orders   Ova and parasite examination   Stool Culture   AP (abdominal pain)   Relevant Orders   CBC with Differential/Platelet (Completed)   Comprehensive metabolic panel (Completed)   Lipase (Completed)   Amylase (Completed)   Sedimentation rate (Completed)   Ova and parasite examination   Stool Culture   C. difficile colitis    CDiff testing negative      Relevant Orders   Ova and parasite examination   Stool Culture    Stool, WBC/Lactoferrin (Completed)   C. Difficile Toxin (Completed)   Diarrhea    Has recurred recently, no recent antibiotic use. Cdiff and cultures negative. If it does not improve she is to follow up with gastroenterolgy         I have changed Lannette Donath HYDROcodone-acetaminophen. I am also having her maintain her conjugated estrogens, acetaminophen, carvedilol, pantoprazole, aspirin EC, dicyclomine, ondansetron, lidocaine, topiramate, levocetirizine, sucralfate, famciclovir, hyoscyamine, prochlorperazine,  dexamethasone, amLODipine, fluticasone, albuterol, cyclobenzaprine, famotidine, potassium chloride SA, potassium chloride SA, trimethoprim-polymyxin b, mupirocin ointment, lenalidomide, Multiple Vitamins-Minerals (MULTIVITAMIN ADULT PO), and erythromycin.  Meds ordered this encounter  Medications  . HYDROcodone-acetaminophen (NORCO/VICODIN) 5-325 MG tablet    Sig: Take 1 tablet by mouth every 4 (four) hours as needed for moderate pain or severe pain.    Dispense:  20 tablet    Refill:  0     Penni Homans, MD

## 2018-11-23 NOTE — Assessment & Plan Note (Signed)
Has recurred recently, no recent antibiotic use. Cdiff and cultures negative. If it does not improve she is to follow up with gastroenterolgy

## 2018-11-23 NOTE — Assessment & Plan Note (Signed)
Avoid offending foods, start probiotics. Do not eat large meals in late evening and consider raising head of bed.  

## 2018-11-23 NOTE — Assessment & Plan Note (Signed)
CDiff testing negative

## 2018-11-24 NOTE — Addendum Note (Signed)
Addended by: Magdalene Molly A on: 11/24/2018 03:46 PM   Modules accepted: Orders

## 2018-11-25 ENCOUNTER — Inpatient Hospital Stay: Payer: Medicare Other

## 2018-11-25 ENCOUNTER — Encounter: Payer: Self-pay | Admitting: Hematology & Oncology

## 2018-11-25 ENCOUNTER — Inpatient Hospital Stay (HOSPITAL_BASED_OUTPATIENT_CLINIC_OR_DEPARTMENT_OTHER): Payer: Medicare Other | Admitting: Hematology & Oncology

## 2018-11-25 ENCOUNTER — Other Ambulatory Visit: Payer: Self-pay

## 2018-11-25 VITALS — BP 124/73 | HR 81 | Temp 98.1°F | Resp 20 | Wt 179.0 lb

## 2018-11-25 DIAGNOSIS — K529 Noninfective gastroenteritis and colitis, unspecified: Secondary | ICD-10-CM

## 2018-11-25 DIAGNOSIS — C9000 Multiple myeloma not having achieved remission: Secondary | ICD-10-CM

## 2018-11-25 DIAGNOSIS — Z5112 Encounter for antineoplastic immunotherapy: Secondary | ICD-10-CM | POA: Diagnosis not present

## 2018-11-25 LAB — CBC WITH DIFFERENTIAL (CANCER CENTER ONLY)
Abs Immature Granulocytes: 0.01 10*3/uL (ref 0.00–0.07)
Basophils Absolute: 0.1 10*3/uL (ref 0.0–0.1)
Basophils Relative: 2 %
Eosinophils Absolute: 0.2 10*3/uL (ref 0.0–0.5)
Eosinophils Relative: 8 %
HEMATOCRIT: 36.4 % (ref 36.0–46.0)
Hemoglobin: 11.6 g/dL — ABNORMAL LOW (ref 12.0–15.0)
Immature Granulocytes: 1 %
Lymphocytes Relative: 30 %
Lymphs Abs: 0.6 10*3/uL — ABNORMAL LOW (ref 0.7–4.0)
MCH: 28.6 pg (ref 26.0–34.0)
MCHC: 31.9 g/dL (ref 30.0–36.0)
MCV: 89.9 fL (ref 80.0–100.0)
Monocytes Absolute: 0.3 10*3/uL (ref 0.1–1.0)
Monocytes Relative: 13 %
Neutro Abs: 1 10*3/uL — ABNORMAL LOW (ref 1.7–7.7)
Neutrophils Relative %: 46 %
Platelet Count: 116 10*3/uL — ABNORMAL LOW (ref 150–400)
RBC: 4.05 MIL/uL (ref 3.87–5.11)
RDW: 15.5 % (ref 11.5–15.5)
WBC Count: 2.2 10*3/uL — ABNORMAL LOW (ref 4.0–10.5)
nRBC: 0 % (ref 0.0–0.2)

## 2018-11-25 LAB — CMP (CANCER CENTER ONLY)
ALT: 12 U/L (ref 0–44)
AST: 16 U/L (ref 15–41)
Albumin: 4 g/dL (ref 3.5–5.0)
Alkaline Phosphatase: 46 U/L (ref 38–126)
Anion gap: 7 (ref 5–15)
BUN: 15 mg/dL (ref 8–23)
CO2: 29 mmol/L (ref 22–32)
Calcium: 9.5 mg/dL (ref 8.9–10.3)
Chloride: 106 mmol/L (ref 98–111)
Creatinine: 0.8 mg/dL (ref 0.44–1.00)
GFR, Estimated: >60 mL/min (ref >60)
Glucose, Bld: 88 mg/dL (ref 70–99)
Potassium: 3.2 mmol/L — ABNORMAL LOW (ref 3.5–5.1)
Sodium: 142 mmol/L (ref 135–145)
Total Bilirubin: 0.5 mg/dL (ref 0.3–1.2)
Total Protein: 8 g/dL (ref 6.5–8.1)

## 2018-11-25 LAB — LACTATE DEHYDROGENASE: LDH: 188 U/L (ref 98–192)

## 2018-11-25 NOTE — Progress Notes (Signed)
Hematology and Oncology Follow Up Visit  Tami [name] [id number] 01-18-1956 63 y.o. 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  Unfortunately, looks like she has developed another bout of colitis.  She started to have abdominal pain recently.  She actually had to go to the emergency room.  She had a CT scan done.  This showed that she has some inflammation in the colon.  She was given Cipro and Flagyl.  She is feeling better right now.  I am not sure why she has the colitis.  I would not think that this is from her Revlimid/Velcade.  However, it certainly could be.  I am not going to treat her now.  We will give her 3 weeks off.  Thankfully she has responded very well to treatment.  Her last M spike was 0.9 g/dL in December.  Her kappa light chain was 3.9 mg/dL.  Her IgG level was 2480 mg/dL.  Her appetite is doing okay.  Surprisingly, eating does not make the diarrhea worse.  Overall, her performance status is ECOG 1.  Medications:  Allergies as of 11/25/2018      Reactions   Benazepril Anaphylaxis, Swelling   angioedema Throat and lip swelling   Ondansetron Hcl Hives   Redness and hives post IV admin on 07/05/17   Codeine Nausea And Vomiting   Morphine Hives   Redness and hives noted post IV admin on 07/05/17      Medication List       Accurate as of November 25, 2018 10:48 AM. Always use your most recent med list.        acetaminophen 325 MG tablet Commonly known as:  TYLENOL Take 650 mg by mouth.   albuterol 108 (90 Base) MCG/ACT inhaler Commonly known as:  PROVENTIL HFA;VENTOLIN HFA Inhale 2 puffs into the lungs every 6 (six) hours as needed for wheezing or shortness of breath.   amLODipine 10 MG tablet Commonly known as:  NORVASC Take 1 tablet (10 mg total) by mouth daily.   aspirin EC 81 MG tablet Take 1  tablet (81 mg total) by mouth daily.   carvedilol 25 MG tablet Commonly known as:  COREG Take 1 tablet (25 mg total) by mouth 2 (two) times daily with a meal.   cyclobenzaprine 10 MG tablet Commonly known as:  FLEXERIL Take 10 mg by mouth as needed.   dexamethasone 4 MG tablet Commonly known as:  DECADRON Take 5 pills at one time with food ONCE a WEEK!!   dicyclomine 20 MG tablet Commonly known as:  BENTYL Take 1 tablet (20 mg total) by mouth 3 (three) times daily as needed for spasms.   erythromycin ophthalmic ointment Place a 1/2 inch ribbon of ointment into the lower eyelid.   famciclovir 250 MG tablet Commonly known as:  FAMVIR Take 1 tablet (250 mg total) by mouth daily.   famotidine 40 MG/5ML suspension Commonly known as:  PEPCID Take 5 mLs (40 mg total) by mouth at bedtime.   fluticasone 50 MCG/ACT nasal spray Commonly known as:  FLONASE Place 2 sprays into both nostrils daily.   HYDROcodone-acetaminophen 5-325 MG tablet Commonly known as:  NORCO/VICODIN Take 1 tablet by mouth every 4 (four) hours as needed for moderate pain or severe pain.   hyoscyamine 0.125 MG SL tablet Commonly known as:  LEVSIN SL Place  1 tablet (0.125 mg total) under the tongue every 4 (four) hours as needed.   lenalidomide 20 MG capsule Commonly known as:  REVLIMID Take 1 pill daily for 3 weeks on and 1 week off.  Take at bedtime [id number]   levocetirizine 5 MG tablet Commonly known as:  XYZAL Take 1 tablet (5 mg total) by mouth every evening.   lidocaine 5 % Commonly known as:  LIDODERM Place 1 patch onto the skin daily. Remove & Discard patch within 12 hours or as directed by MD   MULTIVITAMIN ADULT PO Take by mouth.   mupirocin ointment 2 % Commonly known as:  BACTROBAN Apply 1 application topically 2 (two) times daily.   ondansetron 4 MG tablet Commonly known as:  ZOFRAN Take 1 tablet (4 mg total) by mouth every 8 (eight) hours as needed for nausea or vomiting.     pantoprazole 40 MG tablet Commonly known as:  PROTONIX Take 1 tablet (40 mg total) by mouth 2 (two) times daily.   potassium chloride SA 20 MEQ tablet Commonly known as:  KLOR-CON M20 Take 1 tablet (20 mEq total) by mouth daily.   potassium chloride SA 20 MEQ tablet Commonly known as:  K-DUR,KLOR-CON Take 2 tablets (40 mEq total) by mouth once for 1 dose.   PREMARIN vaginal cream Generic drug:  conjugated estrogens Place vaginally.   prochlorperazine 10 MG tablet Commonly known as:  COMPAZINE Take 1 tablet (10 mg total) by mouth every 6 (six) hours as needed (Nausea or vomiting).   sucralfate 1 g tablet Commonly known as:  CARAFATE Take 1 tablet (1 g total) by mouth 4 (four) times daily -  with meals and at bedtime.   topiramate 25 MG tablet Commonly known as:  TOPAMAX Take 1 tablet (25 mg total) by mouth 2 (two) times daily.   trimethoprim-polymyxin b ophthalmic solution Commonly known as:  POLYTRIM Place 1 drop into both eyes 3 (three) times daily as needed.       Allergies:  Allergies  Allergen Reactions  . Benazepril Anaphylaxis and Swelling    angioedema Throat and lip swelling  . Ondansetron Hcl Hives    Redness and hives post IV admin on 07/05/17  . Codeine Nausea And Vomiting  . Morphine Hives    Redness and hives noted post IV admin on 07/05/17    Past Medical History, Surgical history, Social history, and Family History were reviewed and updated.  Review of Systems: Review of Systems  Constitutional: Negative.   HENT: Negative.   Eyes: Negative.   Respiratory: Negative.   Cardiovascular: Negative.   Gastrointestinal: Negative.   Genitourinary: Negative.   Musculoskeletal: Negative.   Skin: Positive for rash.  Neurological: Negative.   Endo/Heme/Allergies: Negative.   Psychiatric/Behavioral: Negative.      Physical Exam:  weight is 179 lb (81.2 kg). Her oral temperature is 98.1 F (36.7 C). Her blood pressure is 124/73 and her pulse is  81. Her respiration is 20 and oxygen saturation is 100%.   Wt Readings from Last 3 Encounters:  11/25/18 179 lb (81.2 kg)  11/21/18 184 lb 12.8 oz (83.8 kg)  10/29/18 186 lb (84.4 kg)    Physical Exam Vitals signs reviewed.  HENT:     Head: Normocephalic and atraumatic.  Eyes:     Pupils: Pupils are equal, round, and reactive to light.  Neck:     Musculoskeletal: Normal range of motion.  Cardiovascular:     Rate and Rhythm: Normal rate and regular  rhythm.     Heart sounds: Normal heart sounds.  Pulmonary:     Effort: Pulmonary effort is normal.     Breath sounds: Normal breath sounds.  Abdominal:     General: Bowel sounds are normal.     Palpations: Abdomen is soft.  Musculoskeletal: Normal range of motion.        General: No tenderness or deformity.  Lymphadenopathy:     Cervical: No cervical adenopathy.  Skin:    General: Skin is warm and dry.     Findings: No erythema or rash.  Neurological:     Mental Status: She is alert and oriented to person, place, and time.  Psychiatric:        Behavior: Behavior normal.        Thought Content: Thought content normal.        Judgment: Judgment normal.      Lab Results  Component Value Date   WBC 2.2 (L) 11/25/2018   HGB 11.6 (L) 11/25/2018   HCT 36.4 11/25/2018   MCV 89.9 11/25/2018   PLT 116 (L) 11/25/2018   Lab Results  Component Value Date   FERRITIN 169 09/16/2016   IRON 59 09/16/2016   TIBC 281 09/16/2016   UIBC 222 09/16/2016   IRONPCTSAT 21 09/16/2016   Lab Results  Component Value Date   RETICCTPCT 0.8 07/16/2016   RBC 4.05 11/25/2018   RETICCTABS 32,640 07/16/2016   Lab Results  Component Value Date   KPAFRELGTCHN 38.9 (H) 10/28/2018   LAMBDASER 9.8 10/28/2018   KAPLAMBRATIO 3.97 (H) 10/28/2018   Lab Results  Component Value Date   IGGSERUM 2,481 (H) 10/28/2018   IGGSERUM 3,215 (H) 10/28/2018   IGA 188 10/28/2018   IGA 225 10/28/2018   IGMSERUM 109 10/28/2018   IGMSERUM 127 10/28/2018    Lab Results  Component Value Date   TOTALPROTELP 7.8 10/28/2018   ALBUMINELP 3.5 10/28/2018   A1GS 0.3 10/28/2018   A2GS 0.9 10/28/2018   BETS 1.1 10/28/2018   BETA2SER 0.5 07/16/2016   GAMS 2.0 (H) 10/28/2018   MSPIKE 0.9 (H) 10/28/2018   SPEI Comment 10/07/2018     Chemistry      Component Value Date/Time   NA 142 11/25/2018 0908   NA 143 11/08/2017 1127   NA 140 12/31/2016 1000   K 3.2 (L) 11/25/2018 0908   K 4.0 11/08/2017 1127   K 3.6 12/31/2016 1000   CL 106 11/25/2018 0908   CL 107 11/08/2017 1127   CO2 29 11/25/2018 0908   CO2 25 11/08/2017 1127   CO2 24 12/31/2016 1000   BUN 15 11/25/2018 0908   BUN 19 11/08/2017 1127   BUN 22.3 12/31/2016 1000   CREATININE 0.80 11/25/2018 0908   CREATININE 0.9 11/08/2017 1127   CREATININE 0.8 12/31/2016 1000      Component Value Date/Time   CALCIUM 9.5 11/25/2018 0908   CALCIUM 9.9 11/08/2017 1127   CALCIUM 9.5 12/31/2016 1000   ALKPHOS 46 11/25/2018 0908   ALKPHOS 44 11/08/2017 1127   ALKPHOS 51 12/31/2016 1000   AST 16 11/25/2018 0908   AST 28 12/31/2016 1000   ALT 12 11/25/2018 0908   ALT 36 11/08/2017 1127   ALT 16 12/31/2016 1000   BILITOT 0.5 11/25/2018 0908   BILITOT 0.46 12/31/2016 1000      Impression and Plan: Ms. Kobayashi is a very pleasant 63 yo African American female with a previous IgG kappa MGUS with progression to myeloma.   She is  responding nicely.  Her M spike has been coming down.  Again, we will hold on the Velcade.  We will have her come back in 3 weeks.  I told her not to take any Revlimid until we see her back.   [name], MD 1/17/202010:48 AM

## 2018-11-26 LAB — IGG, IGA, IGM
IgA: 200 mg/dL (ref 87–352)
IgG (Immunoglobin G), Serum: 2436 mg/dL — ABNORMAL HIGH (ref 700–1600)
IgM (Immunoglobulin M), Srm: 101 mg/dL (ref 26–217)

## 2018-11-28 LAB — OVA AND PARASITE EXAMINATION
CONCENTRATE RESULT: NONE SEEN
MICRO NUMBER:: 52753
SPECIMEN QUALITY:: ADEQUATE
TRICHROME RESULT:: NONE SEEN

## 2018-11-28 LAB — FECAL LACTOFERRIN, QUANT
Fecal Lactoferrin: NEGATIVE
MICRO NUMBER:: 52970
SPECIMEN QUALITY:: ADEQUATE

## 2018-11-28 LAB — STOOL CULTURE
MICRO NUMBER:: 52751
MICRO NUMBER:: 52752
MICRO NUMBER:: 52754
SHIGA RESULT:: NOT DETECTED
SPECIMEN QUALITY:: ADEQUATE
SPECIMEN QUALITY:: ADEQUATE
SPECIMEN QUALITY:: ADEQUATE

## 2018-11-28 LAB — KAPPA/LAMBDA LIGHT CHAINS
Kappa free light chain: 42.2 mg/L — ABNORMAL HIGH (ref 3.3–19.4)
Kappa, lambda light chain ratio: 4.14 — ABNORMAL HIGH (ref 0.26–1.65)
Lambda free light chains: 10.2 mg/L (ref 5.7–26.3)

## 2018-11-29 ENCOUNTER — Telehealth: Payer: Self-pay

## 2018-11-29 NOTE — Telephone Encounter (Signed)
Copied from [place] [id number]. Topic: Appointment Scheduling - Prior Auth Required for Appointment >> Nov 24, 2018  8:33 AM Tami [name] wrote: No appointment has been scheduled. Patient is requesting a ER Follow Up - [facility] ER [name]/u for Colitis appointment. She was told by the hospital to see her provider within 2-3 days.  Dr. Charlett Blake does not have any appts available within two to three days.  Per scheduling protocol, this appointment requires a prior authorization prior to scheduling. Please advise.  Route to department's PEC pool.   Patient scheduled for 12/02/2018

## 2018-11-30 ENCOUNTER — Other Ambulatory Visit: Payer: Self-pay | Admitting: *Deleted

## 2018-11-30 DIAGNOSIS — C9000 Multiple myeloma not having achieved remission: Secondary | ICD-10-CM

## 2018-11-30 LAB — IMMUNOFIXATION REFLEX, SERUM
IgA: 202 mg/dL (ref 87–352)
IgG (Immunoglobin G), Serum: 2656 mg/dL — ABNORMAL HIGH (ref 700–1600)
IgM (Immunoglobulin M), Srm: 104 mg/dL (ref 26–217)

## 2018-11-30 LAB — PROTEIN ELECTROPHORESIS, SERUM, WITH REFLEX
A/G Ratio: 0.8 (ref 0.7–1.7)
ALPHA-1-GLOBULIN: 0.3 g/dL (ref 0.0–0.4)
Albumin ELP: 3.5 g/dL (ref 2.9–4.4)
Alpha-2-Globulin: 0.9 g/dL (ref 0.4–1.0)
Beta Globulin: 1 g/dL (ref 0.7–1.3)
Gamma Globulin: 2.1 g/dL — ABNORMAL HIGH (ref 0.4–1.8)
Globulin, Total: 4.2 g/dL — ABNORMAL HIGH (ref 2.2–3.9)
M-Spike, %: 1.3 g/dL — ABNORMAL HIGH
SPEP Interpretation: 0
Total Protein ELP: 7.7 g/dL (ref 6.0–8.5)

## 2018-11-30 MED ORDER — LENALIDOMIDE 20 MG PO CAPS: ORAL_CAPSULE | ORAL | 0 refills | Status: DC

## 2018-12-02 ENCOUNTER — Inpatient Hospital Stay: Payer: Self-pay

## 2018-12-02 ENCOUNTER — Ambulatory Visit (INDEPENDENT_AMBULATORY_CARE_PROVIDER_SITE_OTHER): Payer: Medicare Other | Admitting: Family Medicine

## 2018-12-02 ENCOUNTER — Encounter: Payer: Self-pay | Admitting: Family Medicine

## 2018-12-02 ENCOUNTER — Other Ambulatory Visit: Payer: Self-pay

## 2018-12-02 DIAGNOSIS — C9000 Multiple myeloma not having achieved remission: Secondary | ICD-10-CM

## 2018-12-02 DIAGNOSIS — A0472 Enterocolitis due to Clostridium difficile, not specified as recurrent: Secondary | ICD-10-CM | POA: Diagnosis not present

## 2018-12-02 DIAGNOSIS — R197 Diarrhea, unspecified: Secondary | ICD-10-CM | POA: Diagnosis not present

## 2018-12-02 DIAGNOSIS — R700 Elevated erythrocyte sedimentation rate: Secondary | ICD-10-CM | POA: Diagnosis not present

## 2018-12-02 DIAGNOSIS — I10 Essential (primary) hypertension: Secondary | ICD-10-CM

## 2018-12-02 LAB — CBC WITH DIFFERENTIAL/PLATELET
BASOS PCT: 3.5 % — AB (ref 0.0–3.0)
Basophils Absolute: 0.1 10*3/uL (ref 0.0–0.1)
EOS ABS: 0.1 10*3/uL (ref 0.0–0.7)
Eosinophils Relative: 5.7 % — ABNORMAL HIGH (ref 0.0–5.0)
HCT: 35.3 % — ABNORMAL LOW (ref 36.0–46.0)
Hemoglobin: 11.4 g/dL — ABNORMAL LOW (ref 12.0–15.0)
Lymphocytes Relative: 37 % (ref 12.0–46.0)
Lymphs Abs: 1 10*3/uL (ref 0.7–4.0)
MCHC: 32.4 g/dL (ref 30.0–36.0)
MCV: 88.7 fl (ref 78.0–100.0)
MONO ABS: 0.4 10*3/uL (ref 0.1–1.0)
Monocytes Relative: 15.8 % — ABNORMAL HIGH (ref 3.0–12.0)
Neutro Abs: 1 10*3/uL — ABNORMAL LOW (ref 1.4–7.7)
Neutrophils Relative %: 38 % — ABNORMAL LOW (ref 43.0–77.0)
Platelets: 159 10*3/uL (ref 150.0–400.0)
RBC: 3.98 Mil/uL (ref 3.87–5.11)
RDW: 17.9 % — AB (ref 11.5–15.5)
WBC: 2.6 10*3/uL — ABNORMAL LOW (ref 4.0–10.5)

## 2018-12-02 LAB — COMPREHENSIVE METABOLIC PANEL
ALT: 14 U/L (ref 0–35)
AST: 20 U/L (ref 0–37)
Albumin: 3.6 g/dL (ref 3.5–5.2)
Alkaline Phosphatase: 38 U/L — ABNORMAL LOW (ref 39–117)
BUN: 18 mg/dL (ref 6–23)
CHLORIDE: 107 meq/L (ref 96–112)
CO2: 26 mEq/L (ref 19–32)
Calcium: 9 mg/dL (ref 8.4–10.5)
Creatinine, Ser: 0.75 mg/dL (ref 0.40–1.20)
GFR: 93.98 mL/min (ref >60.00)
Glucose, Bld: 75 mg/dL (ref 70–99)
Potassium: 3.7 mEq/L (ref 3.5–5.1)
Sodium: 140 mEq/L (ref 135–145)
Total Bilirubin: 0.5 mg/dL (ref 0.2–1.2)
Total Protein: 7.5 g/dL (ref 6.0–8.3)

## 2018-12-02 LAB — SEDIMENTATION RATE: SED RATE: 62 mm/h — AB (ref 0–30)

## 2018-12-02 NOTE — Assessment & Plan Note (Signed)
Recheck rate

## 2018-12-02 NOTE — Assessment & Plan Note (Signed)
Stopped treatments for now with the colitis diagnosis will reassess in 3 weeks

## 2018-12-02 NOTE — Patient Instructions (Signed)
Colitis  Colitis is inflammation of the colon. Colitis may last a short time (be acute), or it may last a long time (become chronic). What are the causes? This condition may be caused by:  Viruses.  Bacteria.  Reaction to medicine.  Certain autoimmune diseases such as Crohn's disease or ulcerative colitis.  Radiation treatment.  Decreased blood flow to the bowel (ischemia). What are the signs or symptoms? Symptoms of this condition include:  Watery diarrhea.  Passing bloody or tarry stool.  Pain.  Fever.  Vomiting.  Tiredness (fatigue).  Weight loss.  Bloating.  Abdominal pain.  Having fewer bowel movements than usual.  A strong and sudden urge to have a bowel movement.  Feeling like the bowel is not empty after a bowel movement. How is this diagnosed? This condition is diagnosed with a stool test or a blood test. You may also have other tests, such as:  X-rays.  CT scan.  Colonoscopy.  Endoscopy.  Biopsy. How is this treated? Treatment for this condition depends on the cause. The condition may be treated by:  Resting the bowel. This involves not eating or drinking for a period of time.  Fluids that are given through an IV.  Medicine for pain and diarrhea.  Antibiotic medicines.  Cortisone medicines.  Surgery. Follow these instructions at home: Eating and drinking   Follow instructions from your health care provider about eating or drinking restrictions.  Drink enough fluid to keep your urine pale yellow.  Work with a dietitian to determine which foods cause your condition to flare up.  Avoid foods that cause flare-ups.  Eat a well-balanced diet. General instructions  If you were prescribed an antibiotic medicine, take it as told by your health care provider. Do not stop taking the antibiotic even if you start to feel better.  Take over-the-counter and prescription medicines only as told by your health care provider.  Keep all  follow-up visits as told by your health care provider. This is important. Contact a health care provider if:  Your symptoms do not go away.  You develop new symptoms. Get help right away if you:  Have a fever that does not go away with treatment.  Develop chills.  Have extreme weakness, fainting, or dehydration.  Have repeated vomiting.  Develop severe pain in your abdomen.  Pass bloody or tarry stool. Summary  Colitis is inflammation of the colon. Colitis may last a short time (be acute), or it may last a long time (become chronic).  Treatment for this condition depends on the cause and may include resting the bowel, taking medicines, or having surgery.  If you were prescribed an antibiotic medicine, take it as told by your health care provider. Do not stop taking the antibiotic even if you start to feel better.  Get help right away if you develop severe pain in your abdomen.  Keep all follow-up visits as told by your health care provider. This is important. This information is not intended to replace advice given to you by your health care provider. Make sure you discuss any questions you have with your health care provider. Document Released: 12/03/2004 Document Revised: 04/28/2018 Document Reviewed: 04/28/2018 Elsevier Interactive Patient Education  2019 [employer]  

## 2018-12-02 NOTE — Assessment & Plan Note (Signed)
Went to ER and CT scan showed mild colitis now on Cipro and flagyl and improving

## 2018-12-02 NOTE — Assessment & Plan Note (Signed)
Well controlled, no changes to meds. Encouraged heart healthy diet such as the DASH diet and exercise as tolerated.  °

## 2018-12-02 NOTE — Assessment & Plan Note (Signed)
Recent testing was negative

## 2018-12-02 NOTE — Progress Notes (Signed)
Subjective:    [name], female    DOB: 1955/12/02, 63 y.o.   MRN: [id number]  No chief complaint on file.   HPI Patient is in today for hospital follow up. After being seen here for her last visit she had worsening abdominal pain and diarrhea and CT scan confirmed mild colitis. They started her on Cipro and flagyl and within a few days she was feeling some better. No fevers or chills but does endorse fatigue and blurry vision. She has had only one BM this am it was brown but somewhat geatinous. Her appetite is holding steady and she continues to eat. Denies CP/palp/SOB/HA/congestion/fevers/GI or GU c/o. Taking meds as prescribed  Past Medical History:  Diagnosis Date  . Abnormal SPEP 07/26/2016  . Arthritis    knees, hands  . Back pain 07/14/2016  . Bowel obstruction ([place]) 11/2014  . C. difficile diarrhea   . CHF (congestive heart failure) ([place])   . Congestive heart failure ([place]) 02/24/2015   S/p pacemaker  . Depression   . Elevated sed rate 08/15/2017  . Epigastric pain 03/22/2017  . GERD (gastroesophageal reflux disease)   . Hyperlipidemia   . Hypertension   . 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 Low back pain 07/14/2016  . 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 Multiple myeloma ([place]) 07/20/2018  . Multiple myeloma not having achieved remission ([place]) 07/20/2018  . Myalgia 12/31/2016  . Obstructive sleep apnea 03/31/2015  . SOB (shortness of breath) 04/09/2016  . Stroke [facility])    TIAs    Past Surgical History:  Procedure Laterality Date  . ABDOMINAL HYSTERECTOMY     menorraghia, 2006, total  . CHOLECYSTECTOMY    . KNEE SURGERY     right, repair torn torn cartialage  . PACEMAKER INSERTION  08/2014  . TONSILLECTOMY    . TUBAL LIGATION      Family History  Problem Relation Age of Onset  . Diabetes Mother   . Hypertension Mother   . Heart disease Mother        s/p 1 stent  . Hyperlipidemia Mother   . Arthritis Mother   .  Cancer Father        COLON  . Colon cancer Father 79  . Irritable bowel syndrome Sister   . Hyperlipidemia Daughter   . Hypertension Daughter   . Hypertension Maternal Grandmother   . Arthritis Maternal Grandmother   . Heart disease Maternal Grandfather        MI  . Hypertension Maternal Grandfather   . Arthritis Maternal Grandfather   . Hypertension Son     Social History   Socioeconomic History  . Marital status: Divorced    Spouse name: [name]  . Number of children: 3  . Years of education: [name]  . Highest education level: [name]  Occupational History  . [name]  Social Needs  . Financial resource strain: [name]  . Food insecurity:    Worry: [name]    Inability: [name]  . Transportation needs:    Medical: [name]    Non-medical: [name]  Tobacco Use  . Smoking status: Never Smoker  . Smokeless tobacco: Never Used  Substance and Sexual Activity  . Alcohol use: No    Alcohol/week: 0.0 standard drinks  . Drug use: No  . Sexual activity: [name]  Lifestyle  . Physical activity:  Days per week: [name]    Minutes per session: [name]  . Stress: [name]  Relationships  . Social connections:    Talks on phone: [name]    Gets together: [name]    Attends religious service: [name]    Active member of club or organization: [name]    Attends meetings of clubs or organizations: [name]    Relationship status: [name]  . Intimate partner violence:    Fear of current or ex partner: [name]    Emotionally abused: [name]    Physically abused: [name]    Forced sexual activity: [name]  Other Topics Concern  . [name]  Social History Narrative  . [name]    Outpatient Medications Prior to Visit  Medication Sig Dispense Refill  . acetaminophen (TYLENOL) 325 MG tablet Take 650 mg by mouth.    Marland Kitchen albuterol (PROVENTIL HFA;VENTOLIN HFA) 108 (90 Base)  MCG/ACT inhaler Inhale 2 puffs into the lungs every 6 (six) hours as needed for wheezing or shortness of breath. 1 Inhaler 2  . amLODipine (NORVASC) 10 MG tablet Take 1 tablet (10 mg total) by mouth daily. 90 tablet 1  . aspirin EC 81 MG tablet Take 1 tablet (81 mg total) by mouth daily. 90 tablet 1  . carvedilol (COREG) 25 MG tablet Take 1 tablet (25 mg total) by mouth 2 (two) times daily with a meal. 180 tablet 1  . conjugated estrogens (PREMARIN) vaginal cream Place vaginally.    . cyclobenzaprine (FLEXERIL) 10 MG tablet Take 10 mg by mouth as needed.     Marland Kitchen dexamethasone (DECADRON) 4 MG tablet Take 5 pills at one time with food ONCE a WEEK!! 100 tablet 2  . dicyclomine (BENTYL) 20 MG tablet Take 1 tablet (20 mg total) by mouth 3 (three) times daily as needed for spasms. 90 tablet 1  . erythromycin ophthalmic ointment Place a 1/2 inch ribbon of ointment into the lower eyelid. 1 g 0  . famciclovir (FAMVIR) 250 MG tablet Take 1 tablet (250 mg total) by mouth daily. 30 tablet 12  . famotidine (PEPCID) 40 MG/5ML suspension Take 5 mLs (40 mg total) by mouth at bedtime. (Patient taking differently: Take 40 mg by mouth at bedtime as needed. ) 150 mL 5  . fluticasone (FLONASE) 50 MCG/ACT nasal spray Place 2 sprays into both nostrils daily. 16 g 1  . HYDROcodone-acetaminophen (NORCO/VICODIN) 5-325 MG tablet Take 1 tablet by mouth every 4 (four) hours as needed for moderate pain or severe pain. 20 tablet 0  . hyoscyamine (LEVSIN SL) 0.125 MG SL tablet Place 1 tablet (0.125 mg total) under the tongue every 4 (four) hours as needed. 30 tablet 2  . lenalidomide (REVLIMID) 20 MG capsule Take 1 pill daily for 3 weeks on and 1 week off.  Take at bedtime Auth# [id number] 21 capsule 0  . levocetirizine (XYZAL) 5 MG tablet Take 1 tablet (5 mg total) by mouth every evening. 30 tablet 2  . lidocaine (LIDODERM) 5 % Place 1 patch onto the skin daily. Remove & Discard patch within 12 hours or as directed by MD 30 patch 2  .  Multiple Vitamins-Minerals (MULTIVITAMIN ADULT PO) Take by mouth.    . mupirocin ointment (BACTROBAN) 2 % Apply 1 application topically 2 (two) times daily. 22 g 0  . ondansetron (ZOFRAN) 4 MG tablet  Take 1 tablet (4 mg total) by mouth every 8 (eight) hours as needed for nausea or vomiting. 30 tablet 1  . pantoprazole (PROTONIX) 40 MG tablet Take 1 tablet (40 mg total) by mouth 2 (two) times daily. 180 tablet 1  . potassium chloride SA (K-DUR,KLOR-CON) 20 MEQ tablet Take 2 tablets (40 mEq total) by mouth once for 1 dose. 60 tablet 3  . potassium chloride SA (KLOR-CON M20) 20 MEQ tablet Take 1 tablet (20 mEq total) by mouth daily. (Patient taking differently: Take 40 mEq by mouth daily. ) 90 tablet 1  . prochlorperazine (COMPAZINE) 10 MG tablet Take 1 tablet (10 mg total) by mouth every 6 (six) hours as needed (Nausea or vomiting). 30 tablet 1  . sucralfate (CARAFATE) 1 g tablet Take 1 tablet (1 g total) by mouth 4 (four) times daily -  with meals and at bedtime. 120 tablet 0  . topiramate (TOPAMAX) 25 MG tablet Take 1 tablet (25 mg total) by mouth 2 (two) times daily. 180 tablet 0  . trimethoprim-polymyxin b (POLYTRIM) ophthalmic solution Place 1 drop into both eyes 3 (three) times daily as needed. 10 mL 0   No facility-administered medications prior to visit.     Allergies  Allergen Reactions  . Benazepril Anaphylaxis and Swelling    angioedema Throat and lip swelling  . Ondansetron Hcl Hives    Redness and hives post IV admin on 07/05/17  . Codeine Nausea And Vomiting  . Morphine Hives    Redness and hives noted post IV admin on 07/05/17    Review of Systems  Constitutional: Negative for fever and malaise/fatigue.  HENT: Negative for congestion.   Eyes: Negative for blurred vision.  Respiratory: Negative for shortness of breath.   Cardiovascular: Negative for chest pain, palpitations and leg swelling.  Gastrointestinal: Positive for diarrhea. Negative for abdominal pain, blood in  stool and nausea.  Genitourinary: Negative for dysuria and frequency.  Musculoskeletal: Negative for falls.  Skin: Negative for rash.  Neurological: Negative for dizziness, loss of consciousness and headaches.  Endo/Heme/Allergies: Negative for environmental allergies.  Psychiatric/Behavioral: Negative for depression. The patient is not nervous/anxious.        Objective:    Physical Exam Vitals signs and nursing note reviewed.  Constitutional:      General: She is not in acute distress.    Appearance: She is well-developed.  HENT:     Head: Normocephalic and atraumatic.     Nose: Nose normal.  Eyes:     General:        Right eye: No discharge.        Left eye: No discharge.  Neck:     Musculoskeletal: Normal range of motion and neck supple.  Cardiovascular:     Rate and Rhythm: Normal rate and regular rhythm.     Heart sounds: No murmur.  Pulmonary:     Effort: Pulmonary effort is normal.     Breath sounds: Normal breath sounds.  Abdominal:     General: Bowel sounds are normal.     Palpations: Abdomen is soft.     Tenderness: There is no abdominal tenderness.  Skin:    General: Skin is warm and dry.  Neurological:     Mental Status: She is alert and oriented to person, place, and time.     BP 122/84 (BP Location: Left Arm, Patient Position: Sitting, Cuff Size: Normal)   Pulse 83   Temp 98.1 F (36.7 C) (Oral)   Resp 18  Wt 183 lb (83 kg)   SpO2 97%   BMI 28.66 kg/m  Wt Readings from Last 3 Encounters:  12/02/18 183 lb (83 kg)  11/25/18 179 lb (81.2 kg)  11/21/18 184 lb 12.8 oz (83.8 kg)     Lab Results  Component Value Date   WBC 2.2 (L) 11/25/2018   HGB 11.6 (L) 11/25/2018   HCT 36.4 11/25/2018   PLT 116 (L) 11/25/2018   GLUCOSE 88 11/25/2018   CHOL 137 02/15/2018   TRIG 100.0 02/15/2018   HDL 45.90 02/15/2018   LDLCALC 72 02/15/2018   ALT 12 11/25/2018   AST 16 11/25/2018   NA 142 11/25/2018   K 3.2 (L) 11/25/2018   CL 106 11/25/2018    CREATININE 0.80 11/25/2018   BUN 15 11/25/2018   CO2 29 11/25/2018   TSH 1.79 02/15/2018   INR 0.90 07/28/2018    Lab Results  Component Value Date   TSH 1.79 02/15/2018   Lab Results  Component Value Date   WBC 2.2 (L) 11/25/2018   HGB 11.6 (L) 11/25/2018   HCT 36.4 11/25/2018   MCV 89.9 11/25/2018   PLT 116 (L) 11/25/2018   Lab Results  Component Value Date   NA 142 11/25/2018   K 3.2 (L) 11/25/2018   CHLORIDE 108 12/31/2016   CO2 29 11/25/2018   GLUCOSE 88 11/25/2018   BUN 15 11/25/2018   CREATININE 0.80 11/25/2018   BILITOT 0.5 11/25/2018   ALKPHOS 46 11/25/2018   AST 16 11/25/2018   ALT 12 11/25/2018   PROT 8.0 11/25/2018   ALBUMIN 4.0 11/25/2018   CALCIUM 9.5 11/25/2018   ANIONGAP 7 11/25/2018   EGFR >90 12/31/2016   GFR 113.79 11/21/2018   Lab Results  Component Value Date   CHOL 137 02/15/2018   Lab Results  Component Value Date   HDL 45.90 02/15/2018   Lab Results  Component Value Date   LDLCALC 72 02/15/2018   Lab Results  Component Value Date   TRIG 100.0 02/15/2018   Lab Results  Component Value Date   CHOLHDL 3 02/15/2018   No results found for: HGBA1C     Assessment & Plan:   Problem List Items Addressed This Visit    Multiple myeloma ([place]) (Chronic)    Stopped treatments for now with the colitis diagnosis will reassess in 3 weeks      Hypertension    Well controlled, no changes to meds. Encouraged heart healthy diet such as the DASH diet and exercise as tolerated.       Relevant Orders   Comprehensive metabolic panel   Elevated sed rate    Recheck rate      Relevant Orders   Sedimentation rate   C. difficile colitis    Recent testing was negative      Diarrhea    Went to ER and CT scan showed mild colitis now on Cipro and flagyl and improving       Relevant Orders   CBC w/Diff      I am having Gershon Mussel maintain her conjugated estrogens, acetaminophen, carvedilol, pantoprazole, aspirin EC, dicyclomine,  ondansetron, lidocaine, topiramate, levocetirizine, sucralfate, famciclovir, hyoscyamine, prochlorperazine, dexamethasone, amLODipine, fluticasone, albuterol, cyclobenzaprine, famotidine, potassium chloride SA, potassium chloride SA, trimethoprim-polymyxin b, mupirocin ointment, Multiple Vitamins-Minerals (MULTIVITAMIN ADULT PO), erythromycin, HYDROcodone-acetaminophen, and lenalidomide.  No orders of the defined types were placed in this encounter.    Penni Homans, MD

## 2018-12-09 ENCOUNTER — Inpatient Hospital Stay: Payer: Self-pay

## 2018-12-09 ENCOUNTER — Other Ambulatory Visit: Payer: Self-pay

## 2018-12-13 DIAGNOSIS — M545 Low back pain: Secondary | ICD-10-CM | POA: Diagnosis not present

## 2018-12-13 DIAGNOSIS — M5136 Other intervertebral disc degeneration, lumbar region: Secondary | ICD-10-CM | POA: Diagnosis not present

## 2018-12-16 ENCOUNTER — Encounter: Payer: Self-pay | Admitting: Hematology & Oncology

## 2018-12-16 ENCOUNTER — Other Ambulatory Visit: Payer: Self-pay

## 2018-12-16 ENCOUNTER — Inpatient Hospital Stay: Payer: Medicare Other | Attending: Hematology & Oncology | Admitting: Hematology & Oncology

## 2018-12-16 ENCOUNTER — Inpatient Hospital Stay: Payer: Medicare Other

## 2018-12-16 VITALS — BP 109/59 | HR 90 | Temp 98.0°F | Resp 18 | Wt 180.2 lb

## 2018-12-16 DIAGNOSIS — Z5112 Encounter for antineoplastic immunotherapy: Secondary | ICD-10-CM | POA: Insufficient documentation

## 2018-12-16 DIAGNOSIS — C9000 Multiple myeloma not having achieved remission: Secondary | ICD-10-CM | POA: Insufficient documentation

## 2018-12-16 LAB — CMP (CANCER CENTER ONLY)
ALK PHOS: 47 U/L (ref 38–126)
ALT: 16 U/L (ref 0–44)
AST: 23 U/L (ref 15–41)
Albumin: 4.2 g/dL (ref 3.5–5.0)
Anion gap: 7 (ref 5–15)
BILIRUBIN TOTAL: 0.5 mg/dL (ref 0.3–1.2)
BUN: 17 mg/dL (ref 8–23)
CO2: 26 mmol/L (ref 22–32)
Calcium: 10.1 mg/dL (ref 8.9–10.3)
Chloride: 106 mmol/L (ref 98–111)
Creatinine: 0.76 mg/dL (ref 0.44–1.00)
GFR, Est AFR Am: >60 mL/min (ref >60)
GFR, Estimated: >60 mL/min (ref >60)
GLUCOSE: 125 mg/dL — AB (ref 70–99)
Potassium: 3.7 mmol/L (ref 3.5–5.1)
Sodium: 139 mmol/L (ref 135–145)
TOTAL PROTEIN: 8.6 g/dL — AB (ref 6.5–8.1)

## 2018-12-16 LAB — CBC WITH DIFFERENTIAL (CANCER CENTER ONLY)
Abs Immature Granulocytes: 0 10*3/uL (ref 0.00–0.07)
Basophils Absolute: 0 10*3/uL (ref 0.0–0.1)
Basophils Relative: 1 %
Eosinophils Absolute: 0.1 10*3/uL (ref 0.0–0.5)
Eosinophils Relative: 3 %
HCT: 39.6 % (ref 36.0–46.0)
Hemoglobin: 12.5 g/dL (ref 12.0–15.0)
IMMATURE GRANULOCYTES: 0 %
Lymphocytes Relative: 33 %
Lymphs Abs: 1 10*3/uL (ref 0.7–4.0)
MCH: 28.7 pg (ref 26.0–34.0)
MCHC: 31.6 g/dL (ref 30.0–36.0)
MCV: 90.8 fL (ref 80.0–100.0)
Monocytes Absolute: 0.2 10*3/uL (ref 0.1–1.0)
Monocytes Relative: 6 %
Neutro Abs: 1.8 10*3/uL (ref 1.7–7.7)
Neutrophils Relative %: 57 %
Platelet Count: 152 10*3/uL (ref 150–400)
RBC: 4.36 MIL/uL (ref 3.87–5.11)
RDW: 16.1 % — AB (ref 11.5–15.5)
WBC Count: 3.2 10*3/uL — ABNORMAL LOW (ref 4.0–10.5)
nRBC: 0 % (ref 0.0–0.2)

## 2018-12-16 MED ORDER — PROCHLORPERAZINE MALEATE 10 MG PO TABS
ORAL_TABLET | ORAL | Status: AC
  Filled 2018-12-16: qty 1

## 2018-12-16 MED ORDER — PROCHLORPERAZINE MALEATE 10 MG PO TABS
10.0000 mg | ORAL_TABLET | Freq: Once | ORAL | Status: AC
  Administered 2018-12-16: 10 mg via ORAL

## 2018-12-16 MED ORDER — BORTEZOMIB CHEMO SQ INJECTION 3.5 MG (2.5MG/ML)
1.3000 mg/m2 | Freq: Once | INTRAMUSCULAR | Status: AC
  Administered 2018-12-16: 2.5 mg via SUBCUTANEOUS
  Filled 2018-12-16: qty 1

## 2018-12-16 NOTE — Patient Instructions (Signed)
[facility] Discharge Instructions for Patients Receiving Chemotherapy  Today you received the following chemotherapy agents Velcade To help prevent nausea and vomiting after your treatment, we encourage you to take your nausea medication as prescribed.   If you develop nausea and vomiting that is not controlled by your nausea medication, call the clinic.   BELOW ARE SYMPTOMS THAT SHOULD BE REPORTED IMMEDIATELY:  *FEVER GREATER THAN 100.5 F  *CHILLS WITH OR WITHOUT FEVER  NAUSEA AND VOMITING THAT IS NOT CONTROLLED WITH YOUR NAUSEA MEDICATION  *UNUSUAL SHORTNESS OF BREATH  *UNUSUAL BRUISING OR BLEEDING  TENDERNESS IN MOUTH AND THROAT WITH OR WITHOUT PRESENCE OF ULCERS  *URINARY PROBLEMS  *BOWEL PROBLEMS  UNUSUAL RASH Items with * indicate a potential emergency and should be followed up as soon as possible.  Feel free to call the clinic should you have any questions or concerns. The clinic phone number is [phone].  Please show the CHEMO ALERT CARD at check-in to the Emergency Department and triage nurse.   

## 2018-12-16 NOTE — Progress Notes (Signed)
Hematology and Oncology Follow Up Visit  Suriah Peragine [id number] [birth year] 63 y.o. 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 Snipe is here today for follow-up.  He seems to be doing better.  She got through her colitis.  She is off her antibiotics.  I am a little bit worried that her myeloma studies that were done a couple weeks ago showed her M spike to be 1.3 g/dL.  Her IgG level was 2656 mg/dL.  Her kappa light chain was 4.2 mg/dL.  It is certainly troublesome that her M spike is up a little bit.  I told her to restart the Revlimid.  She should be able to get back on it without any problems.  If we do find that she does have progressive disease, then we will clearly have to make a change in her protocol.  I probably would switch her over to Kyprolis from Velcade.  She is eating okay.  She has had no problems with bleeding.  She is had no leg swelling.  She has had no fever.  There is been no cough.  Overall, her performance status is ECOG 1.  Medications:  Allergies as of 12/16/2018      Reactions   Benazepril Anaphylaxis, Swelling   angioedema Throat and lip swelling   Ondansetron Hcl Hives   Redness and hives post IV admin on 07/05/17   Codeine Nausea And Vomiting   Morphine Hives   Redness and hives noted post IV admin on 07/05/17      Medication List       Accurate as of December 16, 2018 10:52 AM. Always use your most recent med list.        acetaminophen 325 MG tablet Commonly known as:  TYLENOL Take 650 mg by mouth.   albuterol 108 (90 Base) MCG/ACT inhaler Commonly known as:  PROVENTIL HFA;VENTOLIN HFA Inhale 2 puffs into the lungs every 6 (six) hours as needed for wheezing or shortness of breath.   amLODipine 10 MG tablet Commonly known as:  NORVASC Take 1 tablet (10 mg total) by mouth daily.   aspirin EC 81  MG tablet Take 1 tablet (81 mg total) by mouth daily.   carvedilol 25 MG tablet Commonly known as:  COREG Take 1 tablet (25 mg total) by mouth 2 (two) times daily with a meal.   cyclobenzaprine 10 MG tablet Commonly known as:  FLEXERIL Take 10 mg by mouth as needed.   dexamethasone 4 MG tablet Commonly known as:  DECADRON Take 5 pills at one time with food ONCE a WEEK!!   dicyclomine 20 MG tablet Commonly known as:  BENTYL Take 1 tablet (20 mg total) by mouth 3 (three) times daily as needed for spasms.   erythromycin ophthalmic ointment Place a 1/2 inch ribbon of ointment into the lower eyelid.   famciclovir 250 MG tablet Commonly known as:  FAMVIR Take 1 tablet (250 mg total) by mouth daily.   famotidine 40 MG/5ML suspension Commonly known as:  PEPCID Take 5 mLs (40 mg total) by mouth at bedtime.   fluticasone 50 MCG/ACT nasal spray Commonly known as:  FLONASE Place 2 sprays into both nostrils daily.   HYDROcodone-acetaminophen 5-325 MG tablet Commonly known as:  NORCO/VICODIN Take 1 tablet by mouth every 4 (four) hours as needed for moderate pain or severe pain.  hyoscyamine 0.125 MG SL tablet Commonly known as:  LEVSIN SL Place 1 tablet (0.125 mg total) under the tongue every 4 (four) hours as needed.   lenalidomide 20 MG capsule Commonly known as:  REVLIMID Take 1 pill daily for 3 weeks on and 1 week off.  Take at bedtime Auth# [id number]   levocetirizine 5 MG tablet Commonly known as:  XYZAL Take 1 tablet (5 mg total) by mouth every evening.   lidocaine 5 % Commonly known as:  LIDODERM Place 1 patch onto the skin daily. Remove & Discard patch within 12 hours or as directed by MD   MULTIVITAMIN ADULT PO Take by mouth.   mupirocin ointment 2 % Commonly known as:  BACTROBAN Apply 1 application topically 2 (two) times daily.   ondansetron 4 MG tablet Commonly known as:  ZOFRAN Take 1 tablet (4 mg total) by mouth every 8 (eight) hours as needed for nausea  or vomiting.   pantoprazole 40 MG tablet Commonly known as:  PROTONIX Take 1 tablet (40 mg total) by mouth 2 (two) times daily.   potassium chloride SA 20 MEQ tablet Commonly known as:  KLOR-CON M20 Take 1 tablet (20 mEq total) by mouth daily.   potassium chloride SA 20 MEQ tablet Commonly known as:  K-DUR,KLOR-CON Take 2 tablets (40 mEq total) by mouth once for 1 dose.   PREMARIN vaginal cream Generic drug:  conjugated estrogens Place vaginally.   prochlorperazine 10 MG tablet Commonly known as:  COMPAZINE Take 1 tablet (10 mg total) by mouth every 6 (six) hours as needed (Nausea or vomiting).   sucralfate 1 g tablet Commonly known as:  CARAFATE Take 1 tablet (1 g total) by mouth 4 (four) times daily -  with meals and at bedtime.   topiramate 25 MG tablet Commonly known as:  TOPAMAX Take 1 tablet (25 mg total) by mouth 2 (two) times daily.   trimethoprim-polymyxin b ophthalmic solution Commonly known as:  POLYTRIM Place 1 drop into both eyes 3 (three) times daily as needed.       Allergies:  Allergies  Allergen Reactions  . Benazepril Anaphylaxis and Swelling    angioedema Throat and lip swelling  . Ondansetron Hcl Hives    Redness and hives post IV admin on 07/05/17  . Codeine Nausea And Vomiting  . Morphine Hives    Redness and hives noted post IV admin on 07/05/17    Past Medical History, Surgical history, Social history, and Family History were reviewed and updated.  Review of Systems: Review of Systems  Constitutional: Negative.   HENT: Negative.   Eyes: Negative.   Respiratory: Negative.   Cardiovascular: Negative.   Gastrointestinal: Negative.   Genitourinary: Negative.   Musculoskeletal: Negative.   Skin: Positive for rash.  Neurological: Negative.   Endo/Heme/Allergies: Negative.   Psychiatric/Behavioral: Negative.      Physical Exam:  weight is 180 lb 4 oz (81.8 kg). Her oral temperature is 98 F (36.7 C). Her blood pressure is 109/59  (abnormal) and her pulse is 90. Her respiration is 18 and oxygen saturation is 100%.   Wt Readings from Last 3 Encounters:  12/16/18 180 lb 4 oz (81.8 kg)  12/02/18 183 lb (83 kg)  11/25/18 179 lb (81.2 kg)    Physical Exam Vitals signs reviewed.  HENT:     Head: Normocephalic and atraumatic.  Eyes:     Pupils: Pupils are equal, round, and reactive to light.  Neck:     Musculoskeletal: Normal range  of motion.  Cardiovascular:     Rate and Rhythm: Normal rate and regular rhythm.     Heart sounds: Normal heart sounds.  Pulmonary:     Effort: Pulmonary effort is normal.     Breath sounds: Normal breath sounds.  Abdominal:     General: Bowel sounds are normal.     Palpations: Abdomen is soft.  Musculoskeletal: Normal range of motion.        General: No tenderness or deformity.  Lymphadenopathy:     Cervical: No cervical adenopathy.  Skin:    General: Skin is warm and dry.     Findings: No erythema or rash.  Neurological:     Mental Status: She is alert and oriented to person, place, and time.  Psychiatric:        Behavior: Behavior normal.        Thought Content: Thought content normal.        Judgment: Judgment normal.      Lab Results  Component Value Date   WBC 3.2 (L) 12/16/2018   HGB 12.5 12/16/2018   HCT 39.6 12/16/2018   MCV 90.8 12/16/2018   PLT 152 12/16/2018   Lab Results  Component Value Date   FERRITIN 169 09/16/2016   IRON 59 09/16/2016   TIBC 281 09/16/2016   UIBC 222 09/16/2016   IRONPCTSAT 21 09/16/2016   Lab Results  Component Value Date   RETICCTPCT 0.8 07/16/2016   RBC 4.36 12/16/2018   RETICCTABS 32,640 07/16/2016   Lab Results  Component Value Date   KPAFRELGTCHN 42.2 (H) 11/25/2018   LAMBDASER 10.2 11/25/2018   KAPLAMBRATIO 4.14 (H) 11/25/2018   Lab Results  Component Value Date   IGGSERUM 2,436 (H) 11/25/2018   IGA 200 11/25/2018   IGMSERUM 101 11/25/2018   Lab Results  Component Value Date   TOTALPROTELP 7.7 11/25/2018     ALBUMINELP 3.5 11/25/2018   A1GS 0.3 11/25/2018   A2GS 0.9 11/25/2018   BETS 1.0 11/25/2018   BETA2SER 0.5 07/16/2016   GAMS 2.1 (H) 11/25/2018   MSPIKE 1.3 (H) 11/25/2018   SPEI Comment 10/07/2018     Chemistry      Component Value Date/Time   NA 139 12/16/2018 0904   NA 143 11/08/2017 1127   NA 140 12/31/2016 1000   K 3.7 12/16/2018 0904   K 4.0 11/08/2017 1127   K 3.6 12/31/2016 1000   CL 106 12/16/2018 0904   CL 107 11/08/2017 1127   CO2 26 12/16/2018 0904   CO2 25 11/08/2017 1127   CO2 24 12/31/2016 1000   BUN 17 12/16/2018 0904   BUN 19 11/08/2017 1127   BUN 22.3 12/31/2016 1000   CREATININE 0.76 12/16/2018 0904   CREATININE 0.9 11/08/2017 1127   CREATININE 0.8 12/31/2016 1000      Component Value Date/Time   CALCIUM 10.1 12/16/2018 0904   CALCIUM 9.9 11/08/2017 1127   CALCIUM 9.5 12/31/2016 1000   ALKPHOS 47 12/16/2018 0904   ALKPHOS 44 11/08/2017 1127   ALKPHOS 51 12/31/2016 1000   AST 23 12/16/2018 0904   AST 28 12/31/2016 1000   ALT 16 12/16/2018 0904   ALT 36 11/08/2017 1127   ALT 16 12/31/2016 1000   BILITOT 0.5 12/16/2018 0904   BILITOT 0.46 12/31/2016 1000      Impression and Plan: Ms. Jeune is a very pleasant 63 yo African American female with a previous IgG kappa MGUS with progression to myeloma.   This is the 15th day  of her fourth cycle of treatment.  After this, we will plan to get her back in 2 weeks for her fifth cycle of treatment.  Again, we will have to see what her myeloma panel looks like.   [name], MD 2/7/202010:52 AM

## 2018-12-17 LAB — IGG, IGA, IGM
IgA: 216 mg/dL (ref 87–352)
IgG (Immunoglobin G), Serum: 2677 mg/dL — ABNORMAL HIGH (ref 700–1600)
IgM (Immunoglobulin M), Srm: 101 mg/dL (ref 26–217)

## 2018-12-19 LAB — KAPPA/LAMBDA LIGHT CHAINS
Kappa free light chain: 48.4 mg/L — ABNORMAL HIGH (ref 3.3–19.4)
Kappa, lambda light chain ratio: 5.63 — ABNORMAL HIGH (ref 0.26–1.65)
Lambda free light chains: 8.6 mg/L (ref 5.7–26.3)

## 2018-12-20 ENCOUNTER — Other Ambulatory Visit: Payer: Self-pay | Admitting: *Deleted

## 2018-12-20 DIAGNOSIS — C9000 Multiple myeloma not having achieved remission: Secondary | ICD-10-CM

## 2018-12-20 LAB — IMMUNOFIXATION REFLEX, SERUM
IGM (IMMUNOGLOBULIN M), SRM: 118 mg/dL (ref 26–217)
IgA: 258 mg/dL (ref 87–352)
IgG (Immunoglobin G), Serum: 3157 mg/dL — ABNORMAL HIGH (ref 700–1600)

## 2018-12-20 LAB — PROTEIN ELECTROPHORESIS, SERUM, WITH REFLEX
A/G Ratio: 0.8 (ref 0.7–1.7)
Albumin ELP: 3.8 g/dL (ref 2.9–4.4)
Alpha-1-Globulin: 0.3 g/dL (ref 0.0–0.4)
Alpha-2-Globulin: 1 g/dL (ref 0.4–1.0)
Beta Globulin: 1 g/dL (ref 0.7–1.3)
Gamma Globulin: 2.4 g/dL — ABNORMAL HIGH (ref 0.4–1.8)
Globulin, Total: 4.7 g/dL — ABNORMAL HIGH (ref 2.2–3.9)
M-Spike, %: 1.3 g/dL — ABNORMAL HIGH
SPEP INTERP: 0
Total Protein ELP: 8.5 g/dL (ref 6.0–8.5)

## 2018-12-20 MED ORDER — LENALIDOMIDE 20 MG PO CAPS: ORAL_CAPSULE | ORAL | 0 refills | Status: DC

## 2018-12-22 ENCOUNTER — Ambulatory Visit (INDEPENDENT_AMBULATORY_CARE_PROVIDER_SITE_OTHER): Payer: Medicare Other | Admitting: Family Medicine

## 2018-12-22 ENCOUNTER — Encounter: Payer: Self-pay | Admitting: Family Medicine

## 2018-12-22 VITALS — BP 140/82 | HR 89 | Temp 98.0°F | Resp 18 | Wt 179.4 lb

## 2018-12-22 DIAGNOSIS — M545 Low back pain, unspecified: Secondary | ICD-10-CM

## 2018-12-22 DIAGNOSIS — M1990 Unspecified osteoarthritis, unspecified site: Secondary | ICD-10-CM | POA: Diagnosis not present

## 2018-12-22 DIAGNOSIS — R778 Other specified abnormalities of plasma proteins: Secondary | ICD-10-CM

## 2018-12-22 DIAGNOSIS — I10 Essential (primary) hypertension: Secondary | ICD-10-CM

## 2018-12-22 DIAGNOSIS — R197 Diarrhea, unspecified: Secondary | ICD-10-CM | POA: Diagnosis not present

## 2018-12-22 NOTE — Assessment & Plan Note (Signed)
Following with heme/onc and is doing well

## 2018-12-22 NOTE — Assessment & Plan Note (Signed)
Increased back and joint pain recently. No redness, warmth or swelling. Encouraged to try tylenol 500 mg tid and Aleve 220 mg once daily. Lidocaine patches prn

## 2018-12-22 NOTE — Assessment & Plan Note (Signed)
Referred to orthopaedics to evaluate chronic but worsening pain. Encouraged moist heat and gentle stretching as tolerated. May try NSAIDs and prescription meds as directed and report if symptoms worsen or seek immediate care

## 2018-12-22 NOTE — Assessment & Plan Note (Signed)
Greatly improved.  

## 2018-12-22 NOTE — Progress Notes (Signed)
Subjective:    [name], female    DOB: 03-Oct-1956, 63 y.o.   MRN: [id number]  No chief complaint on file.   HPI Patient is in today for follow up. She is feeling better today. Her diarrhea is greatly improved. No bloody or tarry stool and now is moving her bowels once most days. No bloody or tarry stool. She is noting increased back and diffuse joint pain recently. No recent fall or injury. No redness or warmth. Denies CP/palp/SOB/HA/congestion/fevers/GI or GU c/o. Taking meds as prescribed  Past Medical History:  Diagnosis Date  . Abnormal SPEP 07/26/2016  . Arthritis    knees, hands  . Back pain 07/14/2016  . Bowel obstruction ([place]) 11/2014  . C. difficile diarrhea   . CHF (congestive heart failure) ([place])   . Congestive heart failure ([place]) 02/24/2015   S/p pacemaker  . Depression   . Elevated sed rate 08/15/2017  . Epigastric pain 03/22/2017  . GERD (gastroesophageal reflux disease)   . Hyperlipidemia   . Hypertension   . 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 Low back pain 07/14/2016  . 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 Multiple myeloma ([place]) 07/20/2018  . Multiple myeloma not having achieved remission ([place]) 07/20/2018  . Myalgia 12/31/2016  . Obstructive sleep apnea 03/31/2015  . SOB (shortness of breath) 04/09/2016  . Stroke [facility])    TIAs    Past Surgical History:  Procedure Laterality Date  . ABDOMINAL HYSTERECTOMY     menorraghia, 2006, total  . CHOLECYSTECTOMY    . KNEE SURGERY     right, repair torn torn cartialage  . PACEMAKER INSERTION  08/2014  . TONSILLECTOMY    . TUBAL LIGATION      Family History  Problem Relation Age of Onset  . Diabetes Mother   . Hypertension Mother   . Heart disease Mother        s/p 1 stent  . Hyperlipidemia Mother   . Arthritis Mother   . Cancer Father        COLON  . Colon cancer Father 69  . Irritable bowel syndrome Sister   . Hyperlipidemia Daughter   . Hypertension  Daughter   . Hypertension Maternal Grandmother   . Arthritis Maternal Grandmother   . Heart disease Maternal Grandfather        MI  . Hypertension Maternal Grandfather   . Arthritis Maternal Grandfather   . Hypertension Son     Social History   Socioeconomic History  . Marital status: Divorced    Spouse name: [name]  . Number of children: 3  . Years of education: [name]  . Highest education level: [name]  Occupational History  . [name]  Social Needs  . Financial resource strain: [name]  . Food insecurity:    Worry: [name]    Inability: [name]  . Transportation needs:    Medical: [name]    Non-medical: [name]  Tobacco Use  . Smoking status: Never Smoker  . Smokeless tobacco: Never Used  Substance and Sexual Activity  . Alcohol use: No    Alcohol/week: 0.0 standard drinks  . Drug use: No  . Sexual activity: [name]  Lifestyle  . Physical activity:    Days per week: [name]    Minutes per session: [name]  . Stress: [name]  Relationships  . Social  connections:    Talks on phone: [name]    Gets together: [name]    Attends religious service: [name]    Active member of club or organization: [name]    Attends meetings of clubs or organizations: [name]    Relationship status: [name]  . Intimate partner violence:    Fear of current or ex partner: [name]    Emotionally abused: [name]    Physically abused: [name]    Forced sexual activity: [name]  Other Topics Concern  . [name]  Social History Narrative  . [name]    Outpatient Medications Prior to Visit  Medication Sig Dispense Refill  . acetaminophen (TYLENOL) 325 MG tablet Take 650 mg by mouth.    Marland Kitchen albuterol (PROVENTIL HFA;VENTOLIN HFA) 108 (90 Base) MCG/ACT inhaler Inhale 2 puffs into the lungs every 6 (six) hours as needed for wheezing or shortness of breath. 1 Inhaler 2  . amLODipine  (NORVASC) 10 MG tablet Take 1 tablet (10 mg total) by mouth daily. 90 tablet 1  . aspirin EC 81 MG tablet Take 1 tablet (81 mg total) by mouth daily. 90 tablet 1  . carvedilol (COREG) 25 MG tablet Take 1 tablet (25 mg total) by mouth 2 (two) times daily with a meal. 180 tablet 1  . conjugated estrogens (PREMARIN) vaginal cream Place vaginally.    . cyclobenzaprine (FLEXERIL) 10 MG tablet Take 10 mg by mouth as needed.     Marland Kitchen dexamethasone (DECADRON) 4 MG tablet Take 5 pills at one time with food ONCE a WEEK!! 100 tablet 2  . dicyclomine (BENTYL) 20 MG tablet Take 1 tablet (20 mg total) by mouth 3 (three) times daily as needed for spasms. 90 tablet 1  . erythromycin ophthalmic ointment Place a 1/2 inch ribbon of ointment into the lower eyelid. 1 g 0  . famciclovir (FAMVIR) 250 MG tablet Take 1 tablet (250 mg total) by mouth daily. 30 tablet 12  . famotidine (PEPCID) 40 MG/5ML suspension Take 5 mLs (40 mg total) by mouth at bedtime. (Patient taking differently: Take 40 mg by mouth at bedtime as needed. ) 150 mL 5  . fluticasone (FLONASE) 50 MCG/ACT nasal spray Place 2 sprays into both nostrils daily. 16 g 1  . HYDROcodone-acetaminophen (NORCO/VICODIN) 5-325 MG tablet Take 1 tablet by mouth every 4 (four) hours as needed for moderate pain or severe pain. 20 tablet 0  . hyoscyamine (LEVSIN SL) 0.125 MG SL tablet Place 1 tablet (0.125 mg total) under the tongue every 4 (four) hours as needed. 30 tablet 2  . lenalidomide (REVLIMID) 20 MG capsule Take 1 pill daily for 3 weeks on and 1 week off.  Take at bedtime Auth# [id number] 21 capsule 0  . levocetirizine (XYZAL) 5 MG tablet Take 1 tablet (5 mg total) by mouth every evening. 30 tablet 2  . lidocaine (LIDODERM) 5 % Place 1 patch onto the skin daily. Remove & Discard patch within 12 hours or as directed by MD 30 patch 2  . Multiple Vitamins-Minerals (MULTIVITAMIN ADULT PO) Take by mouth.    . mupirocin ointment (BACTROBAN) 2 % Apply 1 application topically 2  (two) times daily. 22 g 0  . ondansetron (ZOFRAN) 4 MG tablet Take 1 tablet (4 mg total) by mouth every 8 (eight) hours as needed for nausea or vomiting. 30 tablet 1  . pantoprazole (PROTONIX) 40  MG tablet Take 1 tablet (40 mg total) by mouth 2 (two) times daily. 180 tablet 1  . potassium chloride SA (KLOR-CON M20) 20 MEQ tablet Take 1 tablet (20 mEq total) by mouth daily. (Patient taking differently: Take 40 mEq by mouth daily. ) 90 tablet 1  . prochlorperazine (COMPAZINE) 10 MG tablet Take 1 tablet (10 mg total) by mouth every 6 (six) hours as needed (Nausea or vomiting). 30 tablet 1  . sucralfate (CARAFATE) 1 g tablet Take 1 tablet (1 g total) by mouth 4 (four) times daily -  with meals and at bedtime. 120 tablet 0  . topiramate (TOPAMAX) 25 MG tablet Take 1 tablet (25 mg total) by mouth 2 (two) times daily. 180 tablet 0  . trimethoprim-polymyxin b (POLYTRIM) ophthalmic solution Place 1 drop into both eyes 3 (three) times daily as needed. 10 mL 0  . potassium chloride SA (K-DUR,KLOR-CON) 20 MEQ tablet Take 2 tablets (40 mEq total) by mouth once for 1 dose. 60 tablet 3   No facility-administered medications prior to visit.     Allergies  Allergen Reactions  . Benazepril Anaphylaxis and Swelling    angioedema Throat and lip swelling  . Ondansetron Hcl Hives    Redness and hives post IV admin on 07/05/17  . Codeine Nausea And Vomiting  . Morphine Hives    Redness and hives noted post IV admin on 07/05/17    Review of Systems  Constitutional: Negative for fever and malaise/fatigue.  HENT: Negative for congestion.   Eyes: Negative for blurred vision.  Respiratory: Negative for shortness of breath.   Cardiovascular: Negative for chest pain, palpitations and leg swelling.  Gastrointestinal: Negative for abdominal pain, blood in stool and nausea.  Genitourinary: Negative for dysuria and frequency.  Musculoskeletal: Positive for back pain. Negative for falls.  Skin: Negative for rash.    Neurological: Negative for dizziness, loss of consciousness and headaches.  Endo/Heme/Allergies: Negative for environmental allergies.  Psychiatric/Behavioral: Negative for depression. The patient is not nervous/anxious.        Objective:    Physical Exam Vitals signs and nursing note reviewed.  Constitutional:      General: She is not in acute distress.    Appearance: She is well-developed.  HENT:     Head: Normocephalic and atraumatic.     Nose: Nose normal.  Eyes:     General:        Right eye: No discharge.        Left eye: No discharge.  Neck:     Musculoskeletal: Normal range of motion and neck supple.  Cardiovascular:     Rate and Rhythm: Normal rate and regular rhythm.     Heart sounds: No murmur.  Pulmonary:     Effort: Pulmonary effort is normal.     Breath sounds: Normal breath sounds.  Abdominal:     General: Bowel sounds are normal.     Palpations: Abdomen is soft.     Tenderness: There is no abdominal tenderness.  Skin:    General: Skin is warm and dry.  Neurological:     Mental Status: She is alert and oriented to person, place, and time.     BP 140/82 (BP Location: Left Arm, Patient Position: Sitting, Cuff Size: Normal)   Pulse 89   Temp 98 F (36.7 C) (Oral)   Resp 18   Wt 179 lb 6.4 oz (81.4 kg)   SpO2 96%   BMI 28.10 kg/m  Wt Readings from Last 3  Encounters:  12/22/18 179 lb 6.4 oz (81.4 kg)  12/16/18 180 lb 4 oz (81.8 kg)  12/02/18 183 lb (83 kg)     Lab Results  Component Value Date   WBC 3.2 (L) 12/16/2018   HGB 12.5 12/16/2018   HCT 39.6 12/16/2018   PLT 152 12/16/2018   GLUCOSE 125 (H) 12/16/2018   CHOL 137 02/15/2018   TRIG 100.0 02/15/2018   HDL 45.90 02/15/2018   LDLCALC 72 02/15/2018   ALT 16 12/16/2018   AST 23 12/16/2018   NA 139 12/16/2018   K 3.7 12/16/2018   CL 106 12/16/2018   CREATININE 0.76 12/16/2018   BUN 17 12/16/2018   CO2 26 12/16/2018   TSH 1.79 02/15/2018   INR 0.90 07/28/2018    Lab Results   Component Value Date   TSH 1.79 02/15/2018   Lab Results  Component Value Date   WBC 3.2 (L) 12/16/2018   HGB 12.5 12/16/2018   HCT 39.6 12/16/2018   MCV 90.8 12/16/2018   PLT 152 12/16/2018   Lab Results  Component Value Date   NA 139 12/16/2018   K 3.7 12/16/2018   CHLORIDE 108 12/31/2016   CO2 26 12/16/2018   GLUCOSE 125 (H) 12/16/2018   BUN 17 12/16/2018   CREATININE 0.76 12/16/2018   BILITOT 0.5 12/16/2018   ALKPHOS 47 12/16/2018   AST 23 12/16/2018   ALT 16 12/16/2018   PROT 8.6 (H) 12/16/2018   ALBUMIN 4.2 12/16/2018   CALCIUM 10.1 12/16/2018   ANIONGAP 7 12/16/2018   EGFR >90 12/31/2016   GFR 93.98 12/02/2018   Lab Results  Component Value Date   CHOL 137 02/15/2018   Lab Results  Component Value Date   HDL 45.90 02/15/2018   Lab Results  Component Value Date   LDLCALC 72 02/15/2018   Lab Results  Component Value Date   TRIG 100.0 02/15/2018   Lab Results  Component Value Date   CHOLHDL 3 02/15/2018   No results found for: HGBA1C     Assessment & Plan:   Problem List Items Addressed This Visit    Arthritis    Increased back and joint pain recently. No redness, warmth or swelling. Encouraged to try tylenol 500 mg tid and Aleve 220 mg once daily. Lidocaine patches prn      Hypertension    Well controlled, no changes to meds. Encouraged heart healthy diet such as the DASH diet and exercise as tolerated.       Low back pain - Primary    Referred to orthopaedics to evaluate chronic but worsening pain. Encouraged moist heat and gentle stretching as tolerated. May try NSAIDs and prescription meds as directed and report if symptoms worsen or seek immediate care      Relevant Orders   Ambulatory referral to Orthopedic Surgery   Abnormal SPEP    Following with heme/onc and is doing well      Diarrhea    Greatly improved         I am having Gershon Mussel maintain her conjugated estrogens, acetaminophen, carvedilol, pantoprazole, aspirin  EC, dicyclomine, ondansetron, lidocaine, topiramate, levocetirizine, sucralfate, famciclovir, hyoscyamine, prochlorperazine, dexamethasone, amLODipine, fluticasone, albuterol, cyclobenzaprine, famotidine, potassium chloride SA, potassium chloride SA, trimethoprim-polymyxin b, mupirocin ointment, Multiple Vitamins-Minerals (MULTIVITAMIN ADULT PO), erythromycin, HYDROcodone-acetaminophen, and lenalidomide.  No orders of the defined types were placed in this encounter.    Penni Homans, MD

## 2018-12-22 NOTE — Assessment & Plan Note (Signed)
Well controlled, no changes to meds. Encouraged heart healthy diet such as the DASH diet and exercise as tolerated.  °

## 2018-12-22 NOTE — Patient Instructions (Signed)
Tylenol ES 500 mg tabs 1 tab 3 x daily Aleve 220 mg tab 1 tab daily with food Lidocaine patch Acute Back Pain, Adult Acute back pain is sudden and usually short-lived. It is often caused by an injury to the muscles and tissues in the back. The injury may result from:  A muscle or ligament getting overstretched or torn (strained). Ligaments are tissues that connect bones to each other. Lifting something improperly can cause a back strain.  Wear and tear (degeneration) of the spinal disks. Spinal disks are circular tissue that provides cushioning between the bones of the spine (vertebrae).  Twisting motions, such as while playing sports or doing yard work.  A hit to the back.  Arthritis. You may have a physical exam, lab tests, and imaging tests to find the cause of your pain. Acute back pain usually goes away with rest and home care. Follow these instructions at home: Managing pain, stiffness, and swelling  Take over-the-counter and prescription medicines only as told by your health care provider.  Your health care provider may recommend applying ice during the first 24-48 hours after your pain starts. To do this: ? Put ice in a plastic bag. ? Place a towel between your skin and the bag. ? Leave the ice on for 20 minutes, 2-3 times a day.  If directed, apply heat to the affected area as often as told by your health care provider. Use the heat source that your health care provider recommends, such as a moist heat pack or a heating pad. ? Place a towel between your skin and the heat source. ? Leave the heat on for 20-30 minutes. ? Remove the heat if your skin turns bright red. This is especially important if you are unable to feel pain, heat, or cold. You have a greater risk of getting burned. Activity   Do not stay in bed. Staying in bed for more than 1-2 days can delay your recovery.  Sit up and stand up straight. Avoid leaning forward when you sit, or hunching over when you  stand. ? If you work at a desk, sit close to it so you do not need to lean over. Keep your chin tucked in. Keep your neck drawn back, and keep your elbows bent at a right angle. Your arms should look like the letter "L." ? Sit high and close to the steering wheel when you drive. Add lower back (lumbar) support to your car seat, if needed.  Take short walks on even surfaces as soon as you are able. Try to increase the length of time you walk each day.  Do not sit, drive, or stand in one place for more than 30 minutes at a time. Sitting or standing for long periods of time can put stress on your back.  Do not drive or use heavy machinery while taking prescription pain medicine.  Use proper lifting techniques. When you bend and lift, use positions that put less stress on your back: ? [place] your knees. ? Keep the load close to your body. ? Avoid twisting.  Exercise regularly as told by your health care provider. Exercising helps your back heal faster and helps prevent back injuries by keeping muscles strong and flexible.  Work with a physical therapist to make a safe exercise program, as recommended by your health care provider. Do any exercises as told by your physical therapist. Lifestyle  Maintain a healthy weight. Extra weight puts stress on your back and makes it difficult  to have good posture.  Avoid activities or situations that make you feel anxious or stressed. Stress and anxiety increase muscle tension and can make back pain worse. Learn ways to manage anxiety and stress, such as through exercise. General instructions  Sleep on a firm mattress in a comfortable position. Try lying on your side with your knees slightly bent. If you lie on your back, put a pillow under your knees.  Follow your treatment plan as told by your health care provider. This may include: ? Cognitive or behavioral therapy. ? Acupuncture or massage therapy. ? Meditation or yoga. Contact a health care provider  if:  You have pain that is not relieved with rest or medicine.  You have increasing pain going down into your legs or buttocks.  Your pain does not improve after 2 weeks.  You have pain at night.  You lose weight without trying.  You have a fever or chills. Get help right away if:  You develop new bowel or bladder control problems.  You have unusual weakness or numbness in your arms or legs.  You develop nausea or vomiting.  You develop abdominal pain.  You feel faint. Summary  Acute back pain is sudden and usually short-lived.  Use proper lifting techniques. When you bend and lift, use positions that put less stress on your back.  Take over-the-counter and prescription medicines and apply heat or ice as directed by your health care provider. This information is not intended to replace advice given to you by your health care provider. Make sure you discuss any questions you have with your health care provider. Document Released: 10/26/2005 Document Revised: 06/02/2018 Document Reviewed: 06/09/2017 Elsevier Interactive Patient Education  2019 [employer].

## 2018-12-23 DIAGNOSIS — J40 Bronchitis, not specified as acute or chronic: Secondary | ICD-10-CM | POA: Diagnosis not present

## 2018-12-30 ENCOUNTER — Other Ambulatory Visit: Payer: Self-pay

## 2018-12-30 ENCOUNTER — Inpatient Hospital Stay: Payer: Self-pay

## 2019-01-06 ENCOUNTER — Other Ambulatory Visit: Payer: Self-pay

## 2019-01-06 ENCOUNTER — Inpatient Hospital Stay: Payer: Medicare Other

## 2019-01-06 ENCOUNTER — Other Ambulatory Visit: Payer: Self-pay | Admitting: *Deleted

## 2019-01-06 ENCOUNTER — Inpatient Hospital Stay (HOSPITAL_BASED_OUTPATIENT_CLINIC_OR_DEPARTMENT_OTHER): Payer: Medicare Other | Admitting: Hematology & Oncology

## 2019-01-06 ENCOUNTER — Encounter: Payer: Self-pay | Admitting: Hematology & Oncology

## 2019-01-06 VITALS — BP 144/80 | HR 77 | Temp 98.1°F | Resp 16 | Wt 184.0 lb

## 2019-01-06 DIAGNOSIS — Z5112 Encounter for antineoplastic immunotherapy: Secondary | ICD-10-CM | POA: Diagnosis not present

## 2019-01-06 DIAGNOSIS — M7989 Other specified soft tissue disorders: Secondary | ICD-10-CM

## 2019-01-06 DIAGNOSIS — C9000 Multiple myeloma not having achieved remission: Secondary | ICD-10-CM | POA: Diagnosis not present

## 2019-01-06 LAB — CMP (CANCER CENTER ONLY)
ALT: 16 U/L (ref 0–44)
AST: 21 U/L (ref 15–41)
Albumin: 3.9 g/dL (ref 3.5–5.0)
Alkaline Phosphatase: 42 U/L (ref 38–126)
Anion gap: 7 (ref 5–15)
BUN: 16 mg/dL (ref 8–23)
CO2: 26 mmol/L (ref 22–32)
Calcium: 9.2 mg/dL (ref 8.9–10.3)
Chloride: 108 mmol/L (ref 98–111)
Creatinine: 0.67 mg/dL (ref 0.44–1.00)
GFR, Est AFR Am: >60 mL/min (ref >60)
Glucose, Bld: 108 mg/dL — ABNORMAL HIGH (ref 70–99)
Potassium: 3.5 mmol/L (ref 3.5–5.1)
Sodium: 141 mmol/L (ref 135–145)
TOTAL PROTEIN: 8 g/dL (ref 6.5–8.1)
Total Bilirubin: 0.5 mg/dL (ref 0.3–1.2)

## 2019-01-06 LAB — CBC WITH DIFFERENTIAL (CANCER CENTER ONLY)
Abs Immature Granulocytes: 0 10*3/uL (ref 0.00–0.07)
BASOS PCT: 2 %
Basophils Absolute: 0.1 10*3/uL (ref 0.0–0.1)
EOS ABS: 0.2 10*3/uL (ref 0.0–0.5)
Eosinophils Relative: 8 %
HCT: 34 % — ABNORMAL LOW (ref 36.0–46.0)
Hemoglobin: 11 g/dL — ABNORMAL LOW (ref 12.0–15.0)
Immature Granulocytes: 0 %
Lymphocytes Relative: 30 %
Lymphs Abs: 0.6 10*3/uL — ABNORMAL LOW (ref 0.7–4.0)
MCH: 28.9 pg (ref 26.0–34.0)
MCHC: 32.4 g/dL (ref 30.0–36.0)
MCV: 89.2 fL (ref 80.0–100.0)
Monocytes Absolute: 0.3 10*3/uL (ref 0.1–1.0)
Monocytes Relative: 14 %
Neutro Abs: 1 10*3/uL — ABNORMAL LOW (ref 1.7–7.7)
Neutrophils Relative %: 46 %
PLATELETS: 137 10*3/uL — AB (ref 150–400)
RBC: 3.81 MIL/uL — ABNORMAL LOW (ref 3.87–5.11)
RDW: 15.6 % — ABNORMAL HIGH (ref 11.5–15.5)
WBC Count: 2.1 10*3/uL — ABNORMAL LOW (ref 4.0–10.5)
nRBC: 0 % (ref 0.0–0.2)

## 2019-01-06 MED ORDER — TRIAMTERENE-HCTZ 37.5-25 MG PO TABS: 1.0000 | ORAL_TABLET | Freq: Every day | ORAL | 4 refills | Status: DC

## 2019-01-06 MED ORDER — BORTEZOMIB CHEMO SQ INJECTION 3.5 MG (2.5MG/ML)
1.3000 mg/m2 | Freq: Once | INTRAMUSCULAR | Status: AC
  Administered 2019-01-06: 2.5 mg via SUBCUTANEOUS
  Filled 2019-01-06: qty 1

## 2019-01-06 MED ORDER — PROCHLORPERAZINE MALEATE 10 MG PO TABS
10.0000 mg | ORAL_TABLET | Freq: Once | ORAL | Status: AC
  Administered 2019-01-06: 10 mg via ORAL

## 2019-01-06 MED ORDER — PROCHLORPERAZINE MALEATE 10 MG PO TABS
ORAL_TABLET | ORAL | Status: AC
  Filled 2019-01-06: qty 1

## 2019-01-06 NOTE — Progress Notes (Signed)
Hematology and Oncology Follow Up Visit  Tami [name] [id number] [birth year] 63 y.o. 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  She is doing pretty well.  She is having some swelling in her feet.  I think this probably is from the amlodipine.  I will call in some Maxide (37.5/25) and see if this helps.  She will take 1 pill a day.  She completed her 3 weeks of Revlimid on Wednesday.  Her white cell count is a little bit low.  However, I do not think it is low enough that we need to hold her treatment.  Her last myeloma studies were stable.  Her M spike was 1.3 g/dL.  Her IgG level was 2677 mg/dL.  We will have to see what the myeloma panel looks like today.  Hopefully, we will see the M spike going back down.  If not, then I will have to make a change with her protocol.  She has had no nausea or vomiting.  Her colitis has gotten better.  She has had no fever.  There is been no bleeding.  She has had no mouth sores.  Overall, her performance status is ECOG 1.  Medications:  Allergies as of 01/06/2019      Reactions   Benazepril Anaphylaxis, Swelling   angioedema Throat and lip swelling   Ondansetron Hcl Hives   Redness and hives post IV admin on 07/05/17   Codeine Nausea And Vomiting   Morphine Hives   Redness and hives noted post IV admin on 07/05/17      Medication List       Accurate as of January 06, 2019  9:56 AM. Always use your most recent med list.        acetaminophen 325 MG tablet Commonly known as:  TYLENOL Take 650 mg by mouth.   albuterol 108 (90 Base) MCG/ACT inhaler Commonly known as:  PROVENTIL HFA;VENTOLIN HFA Inhale 2 puffs into the lungs every 6 (six) hours as needed for wheezing or shortness of breath.   amLODipine 10 MG tablet Commonly known as:  NORVASC Take 1 tablet (10 mg total) by  mouth daily.   aspirin EC 81 MG tablet Take 1 tablet (81 mg total) by mouth daily.   carvedilol 25 MG tablet Commonly known as:  COREG Take 1 tablet (25 mg total) by mouth 2 (two) times daily with a meal.   cyclobenzaprine 10 MG tablet Commonly known as:  FLEXERIL Take 10 mg by mouth as needed.   dexamethasone 4 MG tablet Commonly known as:  DECADRON Take 5 pills at one time with food ONCE a WEEK!!   dicyclomine 20 MG tablet Commonly known as:  BENTYL Take 1 tablet (20 mg total) by mouth 3 (three) times daily as needed for spasms.   erythromycin ophthalmic ointment Place a 1/2 inch ribbon of ointment into the lower eyelid.   famciclovir 250 MG tablet Commonly known as:  FAMVIR Take 1 tablet (250 mg total) by mouth daily.   famotidine 40 MG/5ML suspension Commonly known as:  PEPCID Take 5 mLs (40 mg total) by mouth at bedtime.   fluticasone 50 MCG/ACT nasal spray Commonly known as:  FLONASE Place 2 sprays into both nostrils daily.   HYDROcodone-acetaminophen 5-325 MG tablet Commonly known as:  NORCO/VICODIN Take 1 tablet by mouth every 4 (four) hours  as needed for moderate pain or severe pain.   hyoscyamine 0.125 MG SL tablet Commonly known as:  LEVSIN SL Place 1 tablet (0.125 mg total) under the tongue every 4 (four) hours as needed.   lenalidomide 20 MG capsule Commonly known as:  REVLIMID Take 1 pill daily for 3 weeks on and 1 week off.  Take at bedtime Auth# [id number]   levocetirizine 5 MG tablet Commonly known as:  XYZAL Take 1 tablet (5 mg total) by mouth every evening.   lidocaine 5 % Commonly known as:  LIDODERM Place 1 patch onto the skin daily. Remove & Discard patch within 12 hours or as directed by MD   MULTIVITAMIN ADULT PO Take by mouth.   mupirocin ointment 2 % Commonly known as:  BACTROBAN Apply 1 application topically 2 (two) times daily.   ondansetron 4 MG tablet Commonly known as:  ZOFRAN Take 1 tablet (4 mg total) by mouth every 8  (eight) hours as needed for nausea or vomiting.   pantoprazole 40 MG tablet Commonly known as:  PROTONIX Take 1 tablet (40 mg total) by mouth 2 (two) times daily.   potassium chloride SA 20 MEQ tablet Commonly known as:  KLOR-CON M20 Take 1 tablet (20 mEq total) by mouth daily.   potassium chloride SA 20 MEQ tablet Commonly known as:  K-DUR,KLOR-CON Take 2 tablets (40 mEq total) by mouth once for 1 dose.   PREMARIN vaginal cream Generic drug:  conjugated estrogens Place vaginally.   prochlorperazine 10 MG tablet Commonly known as:  COMPAZINE Take 1 tablet (10 mg total) by mouth every 6 (six) hours as needed (Nausea or vomiting).   sucralfate 1 g tablet Commonly known as:  CARAFATE Take 1 tablet (1 g total) by mouth 4 (four) times daily -  with meals and at bedtime.   topiramate 25 MG tablet Commonly known as:  TOPAMAX Take 1 tablet (25 mg total) by mouth 2 (two) times daily.   trimethoprim-polymyxin b ophthalmic solution Commonly known as:  POLYTRIM Place 1 drop into both eyes 3 (three) times daily as needed.       Allergies:  Allergies  Allergen Reactions  . Benazepril Anaphylaxis and Swelling    angioedema Throat and lip swelling  . Ondansetron Hcl Hives    Redness and hives post IV admin on 07/05/17  . Codeine Nausea And Vomiting  . Morphine Hives    Redness and hives noted post IV admin on 07/05/17    Past Medical History, Surgical history, Social history, and Family History were reviewed and updated.  Review of Systems: Review of Systems  Constitutional: Negative.   HENT: Negative.   Eyes: Negative.   Respiratory: Negative.   Cardiovascular: Negative.   Gastrointestinal: Negative.   Genitourinary: Negative.   Musculoskeletal: Negative.   Skin: Positive for rash.  Neurological: Negative.   Endo/Heme/Allergies: Negative.   Psychiatric/Behavioral: Negative.      Physical Exam:  weight is 184 lb (83.5 kg). Her oral temperature is 98.1 F (36.7  C). Her blood pressure is 144/80 (abnormal) and her pulse is 77. Her respiration is 16 and oxygen saturation is 100%.   Wt Readings from Last 3 Encounters:  01/06/19 184 lb (83.5 kg)  12/22/18 179 lb 6.4 oz (81.4 kg)  12/16/18 180 lb 4 oz (81.8 kg)    Physical Exam Vitals signs reviewed.  HENT:     Head: Normocephalic and atraumatic.  Eyes:     Pupils: Pupils are equal, round, and reactive to  light.  Neck:     Musculoskeletal: Normal range of motion.  Cardiovascular:     Rate and Rhythm: Normal rate and regular rhythm.     Heart sounds: Normal heart sounds.  Pulmonary:     Effort: Pulmonary effort is normal.     Breath sounds: Normal breath sounds.  Abdominal:     General: Bowel sounds are normal.     Palpations: Abdomen is soft.  Musculoskeletal: Normal range of motion.        General: No tenderness or deformity.  Lymphadenopathy:     Cervical: No cervical adenopathy.  Skin:    General: Skin is warm and dry.     Findings: No erythema or rash.  Neurological:     Mental Status: She is alert and oriented to person, place, and time.  Psychiatric:        Behavior: Behavior normal.        Thought Content: Thought content normal.        Judgment: Judgment normal.      Lab Results  Component Value Date   WBC 2.1 (L) 01/06/2019   HGB 11.0 (L) 01/06/2019   HCT 34.0 (L) 01/06/2019   MCV 89.2 01/06/2019   PLT 137 (L) 01/06/2019   Lab Results  Component Value Date   FERRITIN 169 09/16/2016   IRON 59 09/16/2016   TIBC 281 09/16/2016   UIBC 222 09/16/2016   IRONPCTSAT 21 09/16/2016   Lab Results  Component Value Date   RETICCTPCT 0.8 07/16/2016   RBC 3.81 (L) 01/06/2019   RETICCTABS 32,640 07/16/2016   Lab Results  Component Value Date   KPAFRELGTCHN 48.4 (H) 12/16/2018   LAMBDASER 8.6 12/16/2018   KAPLAMBRATIO 5.63 (H) 12/16/2018   Lab Results  Component Value Date   IGGSERUM 2,677 (H) 12/16/2018   IGGSERUM 3,157 (H) 12/16/2018   IGA 216 12/16/2018   IGA  258 12/16/2018   IGMSERUM 101 12/16/2018   IGMSERUM 118 12/16/2018   Lab Results  Component Value Date   TOTALPROTELP 8.5 12/16/2018   ALBUMINELP 3.8 12/16/2018   A1GS 0.3 12/16/2018   A2GS 1.0 12/16/2018   BETS 1.0 12/16/2018   BETA2SER 0.5 07/16/2016   GAMS 2.4 (H) 12/16/2018   MSPIKE 1.3 (H) 12/16/2018   SPEI Comment 10/07/2018     Chemistry      Component Value Date/Time   NA 141 01/06/2019 0913   NA 143 11/08/2017 1127   NA 140 12/31/2016 1000   K 3.5 01/06/2019 0913   K 4.0 11/08/2017 1127   K 3.6 12/31/2016 1000   CL 108 01/06/2019 0913   CL 107 11/08/2017 1127   CO2 26 01/06/2019 0913   CO2 25 11/08/2017 1127   CO2 24 12/31/2016 1000   BUN 16 01/06/2019 0913   BUN 19 11/08/2017 1127   BUN 22.3 12/31/2016 1000   CREATININE 0.67 01/06/2019 0913   CREATININE 0.9 11/08/2017 1127   CREATININE 0.8 12/31/2016 1000      Component Value Date/Time   CALCIUM 9.2 01/06/2019 0913   CALCIUM 9.9 11/08/2017 1127   CALCIUM 9.5 12/31/2016 1000   ALKPHOS 42 01/06/2019 0913   ALKPHOS 44 11/08/2017 1127   ALKPHOS 51 12/31/2016 1000   AST 21 01/06/2019 0913   AST 28 12/31/2016 1000   ALT 16 01/06/2019 0913   ALT 36 11/08/2017 1127   ALT 16 12/31/2016 1000   BILITOT 0.5 01/06/2019 0913   BILITOT 0.46 12/31/2016 1000      Impression and  Plan: Ms. Dissinger is a very pleasant 63 yo [place] American female with a previous IgG kappa MGUS with progression to myeloma.   We will go ahead with her fifth cycle of RVD today.  We will see what her myeloma numbers look like.  Hopefully, the Maxide will help with the foot swelling.  We will have her come back in 1 month to see me.   [name], MD 2/28/20209:56 AM

## 2019-01-06 NOTE — Progress Notes (Signed)
Ok to treat with ANC 1.0 per Dr. Marin Olp.

## 2019-01-06 NOTE — Patient Instructions (Signed)
[facility] Discharge Instructions for Patients Receiving Chemotherapy  Today you received the following chemotherapy agents Velcade To help prevent nausea and vomiting after your treatment, we encourage you to take your nausea medication as prescribed.   If you develop nausea and vomiting that is not controlled by your nausea medication, call the clinic.   BELOW ARE SYMPTOMS THAT SHOULD BE REPORTED IMMEDIATELY:  *FEVER GREATER THAN 100.5 F  *CHILLS WITH OR WITHOUT FEVER  NAUSEA AND VOMITING THAT IS NOT CONTROLLED WITH YOUR NAUSEA MEDICATION  *UNUSUAL SHORTNESS OF BREATH  *UNUSUAL BRUISING OR BLEEDING  TENDERNESS IN MOUTH AND THROAT WITH OR WITHOUT PRESENCE OF ULCERS  *URINARY PROBLEMS  *BOWEL PROBLEMS  UNUSUAL RASH Items with * indicate a potential emergency and should be followed up as soon as possible.  Feel free to call the clinic should you have any questions or concerns. The clinic phone number is [phone].  Please show the CHEMO ALERT CARD at check-in to the Emergency Department and triage nurse.   

## 2019-01-07 LAB — IGG, IGA, IGM
IGA: 222 mg/dL (ref 87–352)
IgG (Immunoglobin G), Serum: 2289 mg/dL — ABNORMAL HIGH (ref 700–1600)
IgM (Immunoglobulin M), Srm: 96 mg/dL (ref 26–217)

## 2019-01-09 LAB — PROTEIN ELECTROPHORESIS, SERUM, WITH REFLEX
A/G Ratio: 0.6 — ABNORMAL LOW (ref 0.7–1.7)
Albumin ELP: 2.9 g/dL (ref 2.9–4.4)
Alpha-1-Globulin: 0.4 g/dL (ref 0.0–0.4)
Alpha-2-Globulin: 1.4 g/dL — ABNORMAL HIGH (ref 0.4–1.0)
Beta Globulin: 1.2 g/dL (ref 0.7–1.3)
Gamma Globulin: 1.5 g/dL (ref 0.4–1.8)
Globulin, Total: 4.5 g/dL — ABNORMAL HIGH (ref 2.2–3.9)
TOTAL PROTEIN ELP: 7.4 g/dL (ref 6.0–8.5)

## 2019-01-09 LAB — KAPPA/LAMBDA LIGHT CHAINS
KAPPA, LAMDA LIGHT CHAIN RATIO: 3.13 — AB (ref 0.26–1.65)
Kappa free light chain: 38.2 mg/L — ABNORMAL HIGH (ref 3.3–19.4)
Lambda free light chains: 12.2 mg/L (ref 5.7–26.3)

## 2019-01-10 ENCOUNTER — Telehealth: Payer: Self-pay | Admitting: *Deleted

## 2019-01-10 NOTE — Telephone Encounter (Signed)
Patient notified per order of Dr. Marin Olp that the myeloma is barely there now and this is great.  Patient appreciative of call and has no questions or concerns at this time.

## 2019-01-10 NOTE — Telephone Encounter (Signed)
-----   Message from [name], MD sent at 01/09/2019  5:56 PM EST ----- Call - the myeloma is barely there now!!!  This is great!!  Tami [name]

## 2019-01-13 ENCOUNTER — Inpatient Hospital Stay: Payer: Medicare Other

## 2019-01-13 ENCOUNTER — Inpatient Hospital Stay: Payer: Medicare Other | Attending: Hematology & Oncology

## 2019-01-13 VITALS — BP 143/84 | HR 84 | Temp 98.3°F | Resp 18

## 2019-01-13 DIAGNOSIS — R197 Diarrhea, unspecified: Secondary | ICD-10-CM | POA: Insufficient documentation

## 2019-01-13 DIAGNOSIS — Z5112 Encounter for antineoplastic immunotherapy: Secondary | ICD-10-CM | POA: Diagnosis not present

## 2019-01-13 DIAGNOSIS — C9000 Multiple myeloma not having achieved remission: Secondary | ICD-10-CM | POA: Insufficient documentation

## 2019-01-13 LAB — CBC WITH DIFFERENTIAL (CANCER CENTER ONLY)
Abs Immature Granulocytes: 0.01 10*3/uL (ref 0.00–0.07)
Basophils Absolute: 0.1 10*3/uL (ref 0.0–0.1)
Basophils Relative: 2 %
EOS ABS: 0.1 10*3/uL (ref 0.0–0.5)
EOS PCT: 3 %
HCT: 35.3 % — ABNORMAL LOW (ref 36.0–46.0)
Hemoglobin: 11.2 g/dL — ABNORMAL LOW (ref 12.0–15.0)
Immature Granulocytes: 0 %
Lymphocytes Relative: 35 %
Lymphs Abs: 1.1 10*3/uL (ref 0.7–4.0)
MCH: 28.6 pg (ref 26.0–34.0)
MCHC: 31.7 g/dL (ref 30.0–36.0)
MCV: 90.1 fL (ref 80.0–100.0)
Monocytes Absolute: 0.5 10*3/uL (ref 0.1–1.0)
Monocytes Relative: 17 %
Neutro Abs: 1.3 10*3/uL — ABNORMAL LOW (ref 1.7–7.7)
Neutrophils Relative %: 43 %
Platelet Count: 159 10*3/uL (ref 150–400)
RBC: 3.92 MIL/uL (ref 3.87–5.11)
RDW: 15.4 % (ref 11.5–15.5)
WBC Count: 3.1 10*3/uL — ABNORMAL LOW (ref 4.0–10.5)
nRBC: 0 % (ref 0.0–0.2)

## 2019-01-13 LAB — CMP (CANCER CENTER ONLY)
ALT: 15 U/L (ref 0–44)
ANION GAP: 7 (ref 5–15)
AST: 19 U/L (ref 15–41)
Albumin: 3.9 g/dL (ref 3.5–5.0)
Alkaline Phosphatase: 40 U/L (ref 38–126)
BUN: 15 mg/dL (ref 8–23)
CO2: 27 mmol/L (ref 22–32)
Calcium: 9.2 mg/dL (ref 8.9–10.3)
Chloride: 106 mmol/L (ref 98–111)
Creatinine: 0.75 mg/dL (ref 0.44–1.00)
GFR, Est AFR Am: >60 mL/min (ref >60)
GFR, Estimated: >60 mL/min (ref >60)
GLUCOSE: 123 mg/dL — AB (ref 70–99)
Potassium: 3.5 mmol/L (ref 3.5–5.1)
Sodium: 140 mmol/L (ref 135–145)
TOTAL PROTEIN: 8.1 g/dL (ref 6.5–8.1)
Total Bilirubin: 0.5 mg/dL (ref 0.3–1.2)

## 2019-01-13 MED ORDER — PROCHLORPERAZINE MALEATE 10 MG PO TABS
ORAL_TABLET | ORAL | Status: AC
  Filled 2019-01-13: qty 1

## 2019-01-13 MED ORDER — PROCHLORPERAZINE MALEATE 10 MG PO TABS
10.0000 mg | ORAL_TABLET | Freq: Once | ORAL | Status: AC
  Administered 2019-01-13: 10 mg via ORAL

## 2019-01-13 MED ORDER — BORTEZOMIB CHEMO SQ INJECTION 3.5 MG (2.5MG/ML)
1.3000 mg/m2 | Freq: Once | INTRAMUSCULAR | Status: AC
  Administered 2019-01-13: 2.5 mg via SUBCUTANEOUS
  Filled 2019-01-13: qty 1

## 2019-01-13 NOTE — Progress Notes (Signed)
Ok to proceed with chemo after reviewing counts

## 2019-01-13 NOTE — Patient Instructions (Signed)
[facility] Discharge Instructions for Patients Receiving Chemotherapy  Today you received the following chemotherapy agents:  Velcade  To help prevent nausea and vomiting after your treatment, we encourage you to take your nausea medication as ordered per MD.    If you develop nausea and vomiting that is not controlled by your nausea medication, call the clinic.   BELOW ARE SYMPTOMS THAT SHOULD BE REPORTED IMMEDIATELY:  *FEVER GREATER THAN 100.5 F  *CHILLS WITH OR WITHOUT FEVER  NAUSEA AND VOMITING THAT IS NOT CONTROLLED WITH YOUR NAUSEA MEDICATION  *UNUSUAL SHORTNESS OF BREATH  *UNUSUAL BRUISING OR BLEEDING  TENDERNESS IN MOUTH AND THROAT WITH OR WITHOUT PRESENCE OF ULCERS  *URINARY PROBLEMS  *BOWEL PROBLEMS  UNUSUAL RASH Items with * indicate a potential emergency and should be followed up as soon as possible.  Feel free to call the clinic should you have any questions or concerns. The clinic phone number is [phone].  Please show the CHEMO ALERT CARD at check-in to the Emergency Department and triage nurse.   

## 2019-01-13 NOTE — Progress Notes (Signed)
Pt states that she has had diarrhea for 7 days, approx 4 stools a day and has taken nothing for it.  Dr. Marin Olp notified and order received to proceed with treatment today and for pt to contact her GI MD regarding the diarrhea.  Pt notified and verbalizes that she will contact her GI MD today.

## 2019-01-17 ENCOUNTER — Other Ambulatory Visit: Payer: Self-pay | Admitting: *Deleted

## 2019-01-17 DIAGNOSIS — C9000 Multiple myeloma not having achieved remission: Secondary | ICD-10-CM

## 2019-01-17 MED ORDER — LENALIDOMIDE 20 MG PO CAPS: ORAL_CAPSULE | ORAL | 0 refills | Status: DC

## 2019-01-19 DIAGNOSIS — R197 Diarrhea, unspecified: Secondary | ICD-10-CM | POA: Diagnosis not present

## 2019-01-19 DIAGNOSIS — R1084 Generalized abdominal pain: Secondary | ICD-10-CM | POA: Diagnosis not present

## 2019-01-19 DIAGNOSIS — K760 Fatty (change of) liver, not elsewhere classified: Secondary | ICD-10-CM | POA: Diagnosis not present

## 2019-01-20 ENCOUNTER — Inpatient Hospital Stay: Payer: Medicare Other

## 2019-01-20 ENCOUNTER — Inpatient Hospital Stay (HOSPITAL_BASED_OUTPATIENT_CLINIC_OR_DEPARTMENT_OTHER): Payer: Medicare Other | Admitting: Hematology & Oncology

## 2019-01-20 ENCOUNTER — Ambulatory Visit: Payer: Self-pay | Admitting: Hematology & Oncology

## 2019-01-20 ENCOUNTER — Encounter: Payer: Self-pay | Admitting: Hematology & Oncology

## 2019-01-20 ENCOUNTER — Other Ambulatory Visit: Payer: Self-pay

## 2019-01-20 ENCOUNTER — Telehealth: Payer: Self-pay | Admitting: Gastroenterology

## 2019-01-20 VITALS — BP 152/96 | HR 80 | Temp 98.8°F | Resp 18

## 2019-01-20 DIAGNOSIS — C9000 Multiple myeloma not having achieved remission: Secondary | ICD-10-CM | POA: Diagnosis not present

## 2019-01-20 DIAGNOSIS — R197 Diarrhea, unspecified: Secondary | ICD-10-CM | POA: Diagnosis not present

## 2019-01-20 DIAGNOSIS — Z5112 Encounter for antineoplastic immunotherapy: Secondary | ICD-10-CM | POA: Diagnosis not present

## 2019-01-20 LAB — CMP (CANCER CENTER ONLY)
ALT: 15 U/L (ref 0–44)
AST: 18 U/L (ref 15–41)
Albumin: 4 g/dL (ref 3.5–5.0)
Alkaline Phosphatase: 42 U/L (ref 38–126)
Anion gap: 10 (ref 5–15)
BUN: 15 mg/dL (ref 8–23)
CO2: 25 mmol/L (ref 22–32)
Calcium: 9.2 mg/dL (ref 8.9–10.3)
Chloride: 107 mmol/L (ref 98–111)
Creatinine: 0.78 mg/dL (ref 0.44–1.00)
GFR, Est AFR Am: >60 mL/min (ref >60)
GFR, Estimated: >60 mL/min (ref >60)
Glucose, Bld: 112 mg/dL — ABNORMAL HIGH (ref 70–99)
Potassium: 3.1 mmol/L — ABNORMAL LOW (ref 3.5–5.1)
Sodium: 142 mmol/L (ref 135–145)
Total Bilirubin: 0.5 mg/dL (ref 0.3–1.2)
Total Protein: 8 g/dL (ref 6.5–8.1)

## 2019-01-20 LAB — CBC WITH DIFFERENTIAL (CANCER CENTER ONLY)
Abs Immature Granulocytes: 0.01 10*3/uL (ref 0.00–0.07)
Basophils Absolute: 0.1 10*3/uL (ref 0.0–0.1)
Basophils Relative: 3 %
EOS ABS: 0.2 10*3/uL (ref 0.0–0.5)
EOS PCT: 6 %
HEMATOCRIT: 34.7 % — AB (ref 36.0–46.0)
HEMOGLOBIN: 11.3 g/dL — AB (ref 12.0–15.0)
Immature Granulocytes: 0 %
LYMPHS PCT: 29 %
Lymphs Abs: 0.7 10*3/uL (ref 0.7–4.0)
MCH: 29.2 pg (ref 26.0–34.0)
MCHC: 32.6 g/dL (ref 30.0–36.0)
MCV: 89.7 fL (ref 80.0–100.0)
Monocytes Absolute: 0.2 10*3/uL (ref 0.1–1.0)
Monocytes Relative: 10 %
Neutro Abs: 1.3 10*3/uL — ABNORMAL LOW (ref 1.7–7.7)
Neutrophils Relative %: 52 %
Platelet Count: 127 10*3/uL — ABNORMAL LOW (ref 150–400)
RBC: 3.87 MIL/uL (ref 3.87–5.11)
RDW: 15.5 % (ref 11.5–15.5)
WBC Count: 2.5 10*3/uL — ABNORMAL LOW (ref 4.0–10.5)
nRBC: 0 % (ref 0.0–0.2)

## 2019-01-20 MED ORDER — SODIUM CHLORIDE FLUSH 0.9 % IV SOLN
5.00 | INTRAVENOUS | Status: DC
Start: ? — End: 2019-01-20

## 2019-01-20 MED ORDER — PROMETHAZINE HCL 25 MG PO TABS
25.00 | ORAL_TABLET | ORAL | Status: DC
Start: ? — End: 2019-01-20

## 2019-01-20 MED ORDER — SODIUM CHLORIDE FLUSH 0.9 % IV SOLN
5.00 | INTRAVENOUS | Status: DC
Start: 2019-01-20 — End: 2019-01-20

## 2019-01-20 NOTE — Telephone Encounter (Signed)
Lets make sure we have records Can work her in [employer]

## 2019-01-20 NOTE — Progress Notes (Signed)
Patient states she has diarrhea that is continuous. Patient states she went to the emergency room last night with Abdominal pain and diarrhea. She has [facility] Nor [facility] regarding her abdominal issues and is not able to get an appointment until 03/04/2019. Dr. Marin Olp notified and wants to see her today. Hold treatment per Dr. Marin Olp.  Patient verbalized understanding.

## 2019-01-20 NOTE — Telephone Encounter (Signed)
Records are in Care everywhere have been printed and will be placed on Dr.Gupta's desk for review.

## 2019-01-20 NOTE — Progress Notes (Signed)
Hematology and Oncology Follow Up Visit  Tami [name] [id number] [birth year] 63 y.o. 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  Again, she went to the emergency room yesterday with abdominal pain and diarrhea.  This is been a ongoing problem for her.  She had scans done.  Apparently, nothing was noted.  I am not sure why she is having the diarrhea.  She is not happy with the gastroenterologist that she is seen.  She says that they will not see her for another 6 weeks.  This is unacceptable for me.  I am going to try to see if Dr. grouped down the hall will be able to see her and try to help with her diarrhea.  Even though the abdominal problems predated her chemotherapy, I would not stop the Revlimid.  I will hold her Velcade.  I will stop the famciclovir.  I am just trying to figure out what medicines might be contributing to the diarrhea.  She has had no monoclonal spike in her blood now.  Her last M spike was not observed.  Her IgG level was 2300 mg/dL.  Her kappa light chain was 3.8 mg/dL.  Again, the diarrhea is controlling her quality of life.  Since quality of life is what we are focused on, I am going to hold her chemotherapy for couple weeks.  What is accomplished needlepoint was African-American  Overall, her performance status is ECOG 1.  Medications:  Allergies as of 01/20/2019      Reactions   Benazepril Anaphylaxis, Swelling   angioedema Throat and lip swelling   Ondansetron Hcl Hives   Redness and hives post IV admin on 07/05/17   Codeine Nausea And Vomiting   Morphine Hives   Redness and hives noted post IV admin on 07/05/17      Medication List       Accurate as of January 20, 2019  9:43 AM. Always use your most recent med list.        acetaminophen 325 MG tablet Commonly known as:  TYLENOL  Take 650 mg by mouth.   albuterol 108 (90 Base) MCG/ACT inhaler Commonly known as:  PROVENTIL HFA;VENTOLIN HFA Inhale 2 puffs into the lungs every 6 (six) hours as needed for wheezing or shortness of breath.   amLODipine 10 MG tablet Commonly known as:  NORVASC Take 1 tablet (10 mg total) by mouth daily.   aspirin EC 81 MG tablet Take 1 tablet (81 mg total) by mouth daily.   carvedilol 25 MG tablet Commonly known as:  COREG Take 1 tablet (25 mg total) by mouth 2 (two) times daily with a meal.   cyclobenzaprine 10 MG tablet Commonly known as:  FLEXERIL Take 10 mg by mouth as needed.   dexamethasone 4 MG tablet Commonly known as:  DECADRON Take 5 pills at one time with food ONCE a WEEK!!   dicyclomine 20 MG tablet Commonly known as:  Bentyl Take 1 tablet (20 mg total) by mouth 3 (three) times daily as needed for spasms.   erythromycin ophthalmic ointment Place a 1/2 inch ribbon of ointment into the lower eyelid.   famciclovir 250 MG tablet Commonly known as:  FAMVIR Take 1 tablet (250 mg total) by mouth daily.   famotidine 40 MG/5ML suspension Commonly known as:  Pepcid Take 5 mLs (40  mg total) by mouth at bedtime.   fluticasone 50 MCG/ACT nasal spray Commonly known as:  FLONASE Place 2 sprays into both nostrils daily.   HYDROcodone-acetaminophen 5-325 MG tablet Commonly known as:  NORCO/VICODIN Take 1 tablet by mouth every 4 (four) hours as needed for moderate pain or severe pain.   hyoscyamine 0.125 MG SL tablet Commonly known as:  LEVSIN SL Place 1 tablet (0.125 mg total) under the tongue every 4 (four) hours as needed.   lenalidomide 20 MG capsule Commonly known as:  Revlimid Take 1 pill daily for 3 weeks on and 1 week off.  Take at bedtime Auth# [id number]   levocetirizine 5 MG tablet Commonly known as:  XYZAL Take 1 tablet (5 mg total) by mouth every evening.   lidocaine 5 % Commonly known as:  Lidoderm Place 1 patch onto the skin daily. Remove &  Discard patch within 12 hours or as directed by MD   MULTIVITAMIN ADULT PO Take by mouth.   mupirocin ointment 2 % Commonly known as:  Bactroban Apply 1 application topically 2 (two) times daily.   ondansetron 4 MG tablet Commonly known as:  Zofran Take 1 tablet (4 mg total) by mouth every 8 (eight) hours as needed for nausea or vomiting.   pantoprazole 40 MG tablet Commonly known as:  PROTONIX Take 1 tablet (40 mg total) by mouth 2 (two) times daily.   potassium chloride SA 20 MEQ tablet Commonly known as:  Klor-Con M20 Take 1 tablet (20 mEq total) by mouth daily.   potassium chloride SA 20 MEQ tablet Commonly known as:  K-DUR,KLOR-CON Take 2 tablets (40 mEq total) by mouth once for 1 dose.   Premarin vaginal cream Generic drug:  conjugated estrogens Place vaginally.   prochlorperazine 10 MG tablet Commonly known as:  COMPAZINE Take 1 tablet (10 mg total) by mouth every 6 (six) hours as needed (Nausea or vomiting).   sucralfate 1 g tablet Commonly known as:  Carafate Take 1 tablet (1 g total) by mouth 4 (four) times daily -  with meals and at bedtime.   topiramate 25 MG tablet Commonly known as:  Topamax Take 1 tablet (25 mg total) by mouth 2 (two) times daily.   triamterene-hydrochlorothiazide 37.5-25 MG tablet Commonly known as:  Maxzide-25 Take 1 tablet by mouth daily.   trimethoprim-polymyxin b ophthalmic solution Commonly known as:  Polytrim Place 1 drop into both eyes 3 (three) times daily as needed.       Allergies:  Allergies  Allergen Reactions  . Benazepril Anaphylaxis and Swelling    angioedema Throat and lip swelling  . Ondansetron Hcl Hives    Redness and hives post IV admin on 07/05/17  . Codeine Nausea And Vomiting  . Morphine Hives    Redness and hives noted post IV admin on 07/05/17    Past Medical History, Surgical history, Social history, and Family History were reviewed and updated.  Review of Systems: Review of Systems   Constitutional: Negative.   HENT: Negative.   Eyes: Negative.   Respiratory: Negative.   Cardiovascular: Negative.   Gastrointestinal: Negative.   Genitourinary: Negative.   Musculoskeletal: Negative.   Skin: Positive for rash.  Neurological: Negative.   Endo/Heme/Allergies: Negative.   Psychiatric/Behavioral: Negative.      Physical Exam:  temperature is 98.8 F (37.1 C). Her blood pressure is 152/96 (abnormal) and her pulse is 80. Her respiration is 18 and oxygen saturation is 100%.   Wt Readings from Last 3 Encounters:  01/06/19 184 lb (83.5 kg)  12/22/18 179 lb 6.4 oz (81.4 kg)  12/16/18 180 lb 4 oz (81.8 kg)    Physical Exam Vitals signs reviewed.  HENT:     Head: Normocephalic and atraumatic.  Eyes:     Pupils: Pupils are equal, round, and reactive to light.  Neck:     Musculoskeletal: Normal range of motion.  Cardiovascular:     Rate and Rhythm: Normal rate and regular rhythm.     Heart sounds: Normal heart sounds.  Pulmonary:     Effort: Pulmonary effort is normal.     Breath sounds: Normal breath sounds.  Abdominal:     General: Bowel sounds are normal.     Palpations: Abdomen is soft.  Musculoskeletal: Normal range of motion.        General: No tenderness or deformity.  Lymphadenopathy:     Cervical: No cervical adenopathy.  Skin:    General: Skin is warm and dry.     Findings: No erythema or rash.  Neurological:     Mental Status: She is alert and oriented to person, place, and time.  Psychiatric:        Behavior: Behavior normal.        Thought Content: Thought content normal.        Judgment: Judgment normal.      Lab Results  Component Value Date   WBC 2.5 (L) 01/20/2019   HGB 11.3 (L) 01/20/2019   HCT 34.7 (L) 01/20/2019   MCV 89.7 01/20/2019   PLT 127 (L) 01/20/2019   Lab Results  Component Value Date   FERRITIN 169 09/16/2016   IRON 59 09/16/2016   TIBC 281 09/16/2016   UIBC 222 09/16/2016   IRONPCTSAT 21 09/16/2016   Lab  Results  Component Value Date   RETICCTPCT 0.8 07/16/2016   RBC 3.87 01/20/2019   RETICCTABS 32,640 07/16/2016   Lab Results  Component Value Date   KPAFRELGTCHN 38.2 (H) 01/06/2019   LAMBDASER 12.2 01/06/2019   KAPLAMBRATIO 3.13 (H) 01/06/2019   Lab Results  Component Value Date   IGGSERUM 2,289 (H) 01/06/2019   IGA 222 01/06/2019   IGMSERUM 96 01/06/2019   Lab Results  Component Value Date   TOTALPROTELP 7.4 01/06/2019   ALBUMINELP 2.9 01/06/2019   A1GS 0.4 01/06/2019   A2GS 1.4 (H) 01/06/2019   BETS 1.2 01/06/2019   BETA2SER 0.5 07/16/2016   GAMS 1.5 01/06/2019   MSPIKE Not Observed 01/06/2019   SPEI Comment 10/07/2018     Chemistry      Component Value Date/Time   NA 142 01/20/2019 0806   NA 143 11/08/2017 1127   NA 140 12/31/2016 1000   K 3.1 (L) 01/20/2019 0806   K 4.0 11/08/2017 1127   K 3.6 12/31/2016 1000   CL 107 01/20/2019 0806   CL 107 11/08/2017 1127   CO2 25 01/20/2019 0806   CO2 25 11/08/2017 1127   CO2 24 12/31/2016 1000   BUN 15 01/20/2019 0806   BUN 19 11/08/2017 1127   BUN 22.3 12/31/2016 1000   CREATININE 0.78 01/20/2019 0806   CREATININE 0.9 11/08/2017 1127   CREATININE 0.8 12/31/2016 1000      Component Value Date/Time   CALCIUM 9.2 01/20/2019 0806   CALCIUM 9.9 11/08/2017 1127   CALCIUM 9.5 12/31/2016 1000   ALKPHOS 42 01/20/2019 0806   ALKPHOS 44 11/08/2017 1127   ALKPHOS 51 12/31/2016 1000   AST 18 01/20/2019 0806   AST 28 12/31/2016 1000  ALT 15 01/20/2019 0806   ALT 36 11/08/2017 1127   ALT 16 12/31/2016 1000   BILITOT 0.5 01/20/2019 0806   BILITOT 0.46 12/31/2016 1000      Impression and Plan: Ms. Weatherholtz is a very pleasant 63 yo African American female with a previous IgG kappa MGUS with progression to myeloma.    Hopefully, she will be able to get some relief from the diarrhea.  Hopefully to able to get in to gastroenterology and see Dr. Lyndel Safe.  I am trying to cut back on medications as much as possible.  I am not  going to give her any treatment for another couple weeks.  I think she needs to have a break.  Thankfully, since there is no monoclonal spike in her blood, we have some flexibility with her therapy.  I spent about 30 minutes with her.  This was an urgent visit.  I had to spend some time with her to try to make her quality life better.  I went over to Dr. Steve Rattler office to try to make an appointment for her.  I will see her back in 2 weeks.     [name], MD 3/13/20209:43 AM

## 2019-01-21 DIAGNOSIS — J40 Bronchitis, not specified as acute or chronic: Secondary | ICD-10-CM | POA: Diagnosis not present

## 2019-01-23 NOTE — Telephone Encounter (Signed)
Patient scheduled for an office visit with Dr.Gupta 01/24/2019 at 2:00pm.

## 2019-01-24 ENCOUNTER — Other Ambulatory Visit: Payer: Self-pay

## 2019-01-24 ENCOUNTER — Encounter: Payer: Self-pay | Admitting: Gastroenterology

## 2019-01-24 ENCOUNTER — Ambulatory Visit (INDEPENDENT_AMBULATORY_CARE_PROVIDER_SITE_OTHER): Payer: Medicare Other | Admitting: Gastroenterology

## 2019-01-24 VITALS — BP 128/88 | HR 81 | Temp 98.2°F | Ht 66.0 in | Wt 188.1 lb

## 2019-01-24 DIAGNOSIS — R197 Diarrhea, unspecified: Secondary | ICD-10-CM

## 2019-01-24 NOTE — Progress Notes (Signed)
Chief Complaint:   Referring Provider:  Mosie Lukes, MD      ASSESSMENT AND PLAN;   #1. Diarrhea likely d/t Revlimid (has associated pancytopenia -has been stopped as a few days ago).  Previous H/O constipation. Neg EGD 07/30/2017, 07/14/2018 at [facility], neg colon 06/2017. Neg CT abdo/pelvis 01/2019, 11/2018 except for fatty liver. Remote H/O C. Diff. #2. H/O generalized abdominal pain with neg CT x 2, EGD x2, colon as above. #3. Fatty liver with Nl LFTs.  No liver cirrhosis. #4. GERD with eso stricture s/p EGD with dil 58Fr 07/30/2017 (Dr Reather Laurence), Bx- neg EoE/neg HP. Rpt EGD 07/14/2018: neg.  Plan: - GI pathogen and WBC - Revlimid has already been stopped by Dr. Marin Olp on 01/20/2019 - She is to call us 2 weeks - Imodium AD 2/day as needed to continue. If he continues to have diarrhea, would give her a trial of cholestyramine. - FU in 12 weeks.   HPI:    Tami [name] is a 63 y.o. female  Diarrhea 5-6/day, after eating x 3 months with lower abdo pain.  With some abdominal bloating.  The abdominal pain does get better with defecation.  Used be constipated before.  The only change is addition of Revlimid.  Neg EGD x 2, colon, CT Abdo/pelvis x 2 as above  No weight loss  Has nocturnal diarrhea at times as well.  Had tried multiple meds without any significant relief.  Most recently Imodium 2/day does help her for 1 to 2 days.  She has history of C. difficile in 2014  Has been seen in ED few months ago, given trial of Cipro without any benefit.  Denies having any significant upper GI symptoms including nausea, vomiting, heartburn, regurgitation, odynophagia or dysphagia.   Past Medical History:  Diagnosis Date  . Abnormal SPEP 07/26/2016  . Arthritis    knees, hands  . Back pain 07/14/2016  . Bowel obstruction ([place]) 11/2014  . C. difficile diarrhea   . CHF (congestive heart failure) ([place])   . Colitis   . Congestive heart failure ([place]) 02/24/2015   S/p pacemaker  .  Depression   . Elevated sed rate 08/15/2017  . Epigastric pain 03/22/2017  . GERD (gastroesophageal reflux disease)   . Hyperlipidemia   . Hypertension   . 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 Low back pain 07/14/2016  . 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 Multiple myeloma ([place]) 07/20/2018  . Multiple myeloma not having achieved remission ([place]) 07/20/2018  . Myalgia 12/31/2016  . Obstructive sleep apnea 03/31/2015  . SOB (shortness of breath) 04/09/2016  . Stroke [facility])    TIAs    Past Surgical History:  Procedure Laterality Date  . ABDOMINAL HYSTERECTOMY     menorraghia, 2006, total  . CHOLECYSTECTOMY    . COLONOSCOPY     [facility] [phone] last one 2019   . ESOPHAGOGASTRODUODENOSCOPY  2019   [facility]   . KNEE SURGERY     right, repair torn torn cartialage  . PACEMAKER INSERTION  08/2014  . TONSILLECTOMY    . TUBAL LIGATION      Family History  Problem Relation Age of Onset  . Diabetes Mother   . Hypertension Mother   . Heart disease Mother        s/p 1 stent  . Hyperlipidemia Mother   . Arthritis Mother   . Cancer Father        COLON  . Colon cancer Father 63  .  Irritable bowel syndrome Sister   . Hyperlipidemia Daughter   . Hypertension Daughter   . Hypertension Maternal Grandmother   . Arthritis Maternal Grandmother   . Heart disease Maternal Grandfather        MI  . Hypertension Maternal Grandfather   . Arthritis Maternal Grandfather   . Hypertension Son   . Esophageal cancer Neg Hx     Social History   Tobacco Use  . Smoking status: Never Smoker  . Smokeless tobacco: Never Used  Substance Use Topics  . Alcohol use: No    Alcohol/week: 0.0 standard drinks  . Drug use: No  . Allergies as of 01/24/2019      Reactions   Benazepril Anaphylaxis, Swelling   angioedema Throat and lip swelling   Ondansetron Hcl Hives   Redness and hives post IV admin on 07/05/17   Codeine Nausea And Vomiting    Morphine Hives   Redness and hives noted post IV admin on 07/05/17      Medication List       Accurate as of January 24, 2019  2:14 PM. Always use your most recent med list.        acetaminophen 325 MG tablet Commonly known as:  TYLENOL Take 650 mg by mouth.   albuterol 108 (90 Base) MCG/ACT inhaler Commonly known as:  PROVENTIL HFA;VENTOLIN HFA Inhale 2 puffs into the lungs every 6 (six) hours as needed for wheezing or shortness of breath.   amLODipine 10 MG tablet Commonly known as:  NORVASC Take 1 tablet (10 mg total) by mouth daily.   aspirin EC 81 MG tablet Take 1 tablet (81 mg total) by mouth daily.   carvedilol 25 MG tablet Commonly known as:  COREG Take 1 tablet (25 mg total) by mouth 2 (two) times daily with a meal.   cyclobenzaprine 10 MG tablet Commonly known as:  FLEXERIL Take 10 mg by mouth as needed.   dexamethasone 4 MG tablet Commonly known as:  DECADRON Take 5 pills at one time with food ONCE a WEEK!!   dicyclomine 20 MG tablet Commonly known as:  Bentyl Take 1 tablet (20 mg total) by mouth 3 (three) times daily as needed for spasms.   erythromycin ophthalmic ointment Place a 1/2 inch ribbon of ointment into the lower eyelid.   famciclovir 250 MG tablet Commonly known as:  FAMVIR Take 1 tablet (250 mg total) by mouth daily.   famotidine 40 MG/5ML suspension Commonly known as:  Pepcid Take 5 mLs (40 mg total) by mouth at bedtime.   fluticasone 50 MCG/ACT nasal spray Commonly known as:  FLONASE Place 2 sprays into both nostrils daily.   HYDROcodone-acetaminophen 5-325 MG tablet Commonly known as:  NORCO/VICODIN Take 1 tablet by mouth every 4 (four) hours as needed for moderate pain or severe pain.   hyoscyamine 0.125 MG SL tablet Commonly known as:  LEVSIN SL Place 1 tablet (0.125 mg total) under the tongue every 4 (four) hours as needed.   lenalidomide 20 MG capsule Commonly known as:  Revlimid Take 1 pill daily for 3 weeks on and 1  week off.  Take at bedtime Auth# [id number]   levocetirizine 5 MG tablet Commonly known as:  XYZAL Take 1 tablet (5 mg total) by mouth every evening.   lidocaine 5 % Commonly known as:  Lidoderm Place 1 patch onto the skin daily. Remove & Discard patch within 12 hours or as directed by MD   MULTIVITAMIN ADULT PO Take by mouth.  mupirocin ointment 2 % Commonly known as:  Bactroban Apply 1 application topically 2 (two) times daily.   ondansetron 4 MG tablet Commonly known as:  Zofran Take 1 tablet (4 mg total) by mouth every 8 (eight) hours as needed for nausea or vomiting.   pantoprazole 40 MG tablet Commonly known as:  PROTONIX Take 1 tablet (40 mg total) by mouth 2 (two) times daily.   potassium chloride SA 20 MEQ tablet Commonly known as:  Klor-Con M20 Take 1 tablet (20 mEq total) by mouth daily.   potassium chloride SA 20 MEQ tablet Commonly known as:  K-DUR,KLOR-CON Take 2 tablets (40 mEq total) by mouth once for 1 dose.   Premarin vaginal cream Generic drug:  conjugated estrogens Place vaginally.   prochlorperazine 10 MG tablet Commonly known as:  COMPAZINE Take 1 tablet (10 mg total) by mouth every 6 (six) hours as needed (Nausea or vomiting).   sucralfate 1 g tablet Commonly known as:  Carafate Take 1 tablet (1 g total) by mouth 4 (four) times daily -  with meals and at bedtime.   topiramate 25 MG tablet Commonly known as:  Topamax Take 1 tablet (25 mg total) by mouth 2 (two) times daily.   triamterene-hydrochlorothiazide 37.5-25 MG tablet Commonly known as:  Maxzide-25 Take 1 tablet by mouth daily.   trimethoprim-polymyxin b ophthalmic solution Commonly known as:  Polytrim Place 1 drop into both eyes 3 (three) times daily as needed.         Allergies  Allergen Reactions  . Benazepril Anaphylaxis and Swelling    angioedema Throat and lip swelling  . Ondansetron Hcl Hives    Redness and hives post IV admin on 07/05/17  . Codeine Nausea And  Vomiting  . Morphine Hives    Redness and hives noted post IV admin on 07/05/17    Review of Systems:  Constitutional: Denies fever, chills, diaphoresis, appetite change and fatigue.  HEENT: Denies photophobia, eye pain, redness, hearing loss, ear pain, congestion, sore throat, rhinorrhea, sneezing, mouth sores, neck pain, neck stiffness and tinnitus.   Respiratory: Denies SOB, DOE, cough, chest tightness,  and wheezing.   Cardiovascular: Denies chest pain, palpitations and leg swelling.  Genitourinary: Denies dysuria, urgency, frequency, hematuria, flank pain and difficulty urinating.  Musculoskeletal: Denies myalgias, back pain, joint swelling, arthralgias and gait problem.  Skin: No rash.  Neurological: Denies dizziness, seizures, syncope, weakness, light-headedness, numbness and headaches.  Hematological: Denies adenopathy. Easy bruising, personal or family bleeding history  Psychiatric/Behavioral: No anxiety or depression     Physical Exam:    BP 128/88   Pulse 81   Temp 98.2 F (36.8 C)   Ht '5\' 6"'$  (1.676 m)   Wt 188 lb 2 oz (85.3 kg)   BMI 30.36 kg/m  Filed Weights   01/24/19 1334  Weight: 188 lb 2 oz (85.3 kg)   Constitutional:  Well-developed, in no acute distress. Psychiatric: Normal mood and affect. Behavior is normal. HEENT: Pupils normal.  Conjunctivae are normal. No scleral icterus. Neck supple.  Cardiovascular: Normal rate, regular rhythm. No edema Pulmonary/chest: Effort normal and breath sounds normal. No wheezing, rales or rhonchi. Abdominal: Soft, nondistended. Nontender. Bowel sounds active throughout. There are no masses palpable. No hepatomegaly. Rectal:  defered Neurological: Alert and oriented to person place and time. Skin: Skin is warm and dry. No rashes noted.  Data Reviewed: I have personally reviewed following labs and imaging studies  CBC: CBC Latest Ref Rng & Units 01/20/2019 01/13/2019 01/06/2019  WBC 4.0 - 10.5 K/uL 2.5(L) 3.1(L) 2.1(L)   Hemoglobin 12.0 - 15.0 g/dL 11.3(L) 11.2(L) 11.0(L)  Hematocrit 36.0 - 46.0 % 34.7(L) 35.3(L) 34.0(L)  Platelets 150 - 400 K/uL 127(L) 159 137(L)    CMP: CMP Latest Ref Rng & Units 01/20/2019 01/13/2019 01/06/2019  Glucose 70 - 99 mg/dL 112(H) 123(H) 108(H)  BUN 8 - 23 mg/dL '15 15 16  '$ Creatinine 0.44 - 1.00 mg/dL 0.78 0.75 0.67  Sodium 135 - 145 mmol/L 142 140 141  Potassium 3.5 - 5.1 mmol/L 3.1(L) 3.5 3.5  Chloride 98 - 111 mmol/L 107 106 108  CO2 22 - 32 mmol/L '25 27 26  '$ Calcium 8.9 - 10.3 mg/dL 9.2 9.2 9.2  Total Protein 6.5 - 8.1 g/dL 8.0 8.1 8.0  Total Bilirubin 0.3 - 1.2 mg/dL 0.5 0.5 0.5  Alkaline Phos 38 - 126 U/L 42 40 42  AST 15 - 41 U/L '18 19 21  '$ ALT 0 - 44 U/L '15 15 16    '$ GFR: Estimated Creatinine Clearance: 78.2 mL/min (by C-G formula based on SCr of 0.78 mg/dL). Liver Function Tests: Recent Labs  Lab 01/20/19 0806  AST 18  ALT 15  ALKPHOS 42  BILITOT 0.5  PROT 8.0  ALBUMIN 4.0   Extensive notes were reviewed.   Carmell Austria, MD 01/24/2019, 2:05 PM  Cc: Mosie Lukes, MD

## 2019-01-24 NOTE — Patient Instructions (Signed)
If you are age 65 or older, your body mass index should be between 23-30. Your Body mass index is 30.36 kg/m. If this is out of the aforementioned range listed, please consider follow up with your Primary Care Provider.  If you are age 69 or younger, your body mass index should be between 19-25. Your Body mass index is 30.36 kg/m. If this is out of the aformentioned range listed, please consider follow up with your Primary Care Provider.   Please purchase the following medications over the counter and take as directed: Immodium   Please call Dr. Leland Her nurse  in 2 weeks at [phone]  to let her now how you are doing.   Thank you,  Dr. Jackquline Denmark

## 2019-01-25 ENCOUNTER — Other Ambulatory Visit: Payer: Medicare Other

## 2019-01-25 DIAGNOSIS — R197 Diarrhea, unspecified: Secondary | ICD-10-CM | POA: Diagnosis not present

## 2019-01-30 LAB — GASTROINTESTINAL PATHOGEN PANEL PCR
C. difficile Tox A/B, PCR: UNDETERMINED — AB
Campylobacter, PCR: UNDETERMINED — AB
Cryptosporidium, PCR: UNDETERMINED — AB
E coli (ETEC) LT/ST PCR: UNDETERMINED — AB
E coli (STEC) stx1/stx2, PCR: UNDETERMINED — AB
E coli 0157, PCR: UNDETERMINED — AB
Giardia lamblia, PCR: UNDETERMINED — AB
Norovirus, PCR: UNDETERMINED — AB
Rotavirus A, PCR: UNDETERMINED — AB
SALMONELLA, PCR: UNDETERMINED — AB
SHIGELLA, PCR: UNDETERMINED — AB

## 2019-02-01 ENCOUNTER — Other Ambulatory Visit: Payer: Self-pay

## 2019-02-01 DIAGNOSIS — R197 Diarrhea, unspecified: Secondary | ICD-10-CM

## 2019-02-03 ENCOUNTER — Inpatient Hospital Stay (HOSPITAL_BASED_OUTPATIENT_CLINIC_OR_DEPARTMENT_OTHER): Payer: Medicare Other | Admitting: Hematology & Oncology

## 2019-02-03 ENCOUNTER — Inpatient Hospital Stay: Payer: Medicare Other

## 2019-02-03 ENCOUNTER — Other Ambulatory Visit: Payer: Self-pay

## 2019-02-03 ENCOUNTER — Other Ambulatory Visit: Payer: Self-pay | Admitting: *Deleted

## 2019-02-03 ENCOUNTER — Other Ambulatory Visit (INDEPENDENT_AMBULATORY_CARE_PROVIDER_SITE_OTHER): Payer: Medicare Other

## 2019-02-03 VITALS — BP 124/90 | HR 69 | Temp 98.1°F | Resp 17 | Wt 177.8 lb

## 2019-02-03 DIAGNOSIS — R197 Diarrhea, unspecified: Secondary | ICD-10-CM | POA: Diagnosis not present

## 2019-02-03 DIAGNOSIS — C9000 Multiple myeloma not having achieved remission: Secondary | ICD-10-CM

## 2019-02-03 DIAGNOSIS — Z5112 Encounter for antineoplastic immunotherapy: Secondary | ICD-10-CM | POA: Diagnosis not present

## 2019-02-03 LAB — CMP (CANCER CENTER ONLY)
ALK PHOS: 51 U/L (ref 38–126)
ALT: 15 U/L (ref 0–44)
AST: 21 U/L (ref 15–41)
Albumin: 4.2 g/dL (ref 3.5–5.0)
Anion gap: 9 (ref 5–15)
BUN: 20 mg/dL (ref 8–23)
CALCIUM: 9.7 mg/dL (ref 8.9–10.3)
CO2: 25 mmol/L (ref 22–32)
Chloride: 105 mmol/L (ref 98–111)
Creatinine: 0.82 mg/dL (ref 0.44–1.00)
GFR, Est AFR Am: >60 mL/min (ref >60)
GFR, Estimated: >60 mL/min (ref >60)
Glucose, Bld: 91 mg/dL (ref 70–99)
Potassium: 3.5 mmol/L (ref 3.5–5.1)
Sodium: 139 mmol/L (ref 135–145)
Total Bilirubin: 0.6 mg/dL (ref 0.3–1.2)
Total Protein: 8.9 g/dL — ABNORMAL HIGH (ref 6.5–8.1)

## 2019-02-03 LAB — CBC WITH DIFFERENTIAL (CANCER CENTER ONLY)
Abs Immature Granulocytes: 0.01 10*3/uL (ref 0.00–0.07)
Basophils Absolute: 0.1 10*3/uL (ref 0.0–0.1)
Basophils Relative: 2 %
Eosinophils Absolute: 0.1 10*3/uL (ref 0.0–0.5)
Eosinophils Relative: 5 %
HCT: 37.6 % (ref 36.0–46.0)
HEMOGLOBIN: 12 g/dL (ref 12.0–15.0)
Immature Granulocytes: 0 %
Lymphocytes Relative: 33 %
Lymphs Abs: 1 10*3/uL (ref 0.7–4.0)
MCH: 29.1 pg (ref 26.0–34.0)
MCHC: 31.9 g/dL (ref 30.0–36.0)
MCV: 91 fL (ref 80.0–100.0)
Monocytes Absolute: 0.2 10*3/uL (ref 0.1–1.0)
Monocytes Relative: 8 %
Neutro Abs: 1.5 10*3/uL — ABNORMAL LOW (ref 1.7–7.7)
Neutrophils Relative %: 52 %
Platelet Count: 149 10*3/uL — ABNORMAL LOW (ref 150–400)
RBC: 4.13 MIL/uL (ref 3.87–5.11)
RDW: 15 % (ref 11.5–15.5)
WBC Count: 2.9 10*3/uL — ABNORMAL LOW (ref 4.0–10.5)
nRBC: 0 % (ref 0.0–0.2)

## 2019-02-03 MED ORDER — POMALIDOMIDE 3 MG PO CAPS: ORAL_CAPSULE | ORAL | 0 refills | Status: DC

## 2019-02-03 MED ORDER — BORTEZOMIB CHEMO SQ INJECTION 3.5 MG (2.5MG/ML)
1.3000 mg/m2 | Freq: Once | INTRAMUSCULAR | Status: AC
  Administered 2019-02-03: 2.5 mg via SUBCUTANEOUS
  Filled 2019-02-03: qty 1

## 2019-02-03 MED ORDER — POMALIDOMIDE 3 MG PO CAPS: ORAL_CAPSULE | ORAL | 4 refills | Status: DC

## 2019-02-03 MED ORDER — PROCHLORPERAZINE MALEATE 10 MG PO TABS
ORAL_TABLET | ORAL | Status: AC
  Filled 2019-02-03: qty 1

## 2019-02-03 MED ORDER — PROCHLORPERAZINE MALEATE 10 MG PO TABS
10.0000 mg | ORAL_TABLET | Freq: Once | ORAL | Status: AC
  Administered 2019-02-03: 10 mg via ORAL

## 2019-02-03 NOTE — Progress Notes (Signed)
Hematology and Oncology Follow Up Visit  Tami [name] [id number] 25-Jun-1956 63 y.o. 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  She was able to see gastroenterology.  They felt that the diarrhea was probably from the Revlimid.  I certainly cannot argue this.  As such, she now is off Revlimid.  I feel she needs to be on an oral agent.  I will try her on pomalidomide.  I think this would be reasonable.  I will try her on 3 mg daily for 21 days on and 7 days off.  Hopefully, she will not have problems with pomalidomide.  Overall, she is doing better now that the diarrhea has resolved.  She is eating more.  She has had no rashes.  There is been no bleeding.  She has had no cough.  Overall, her performance status is ECOG 1.  Medications:  Allergies as of 02/03/2019      Reactions   Benazepril Anaphylaxis, Swelling   angioedema Throat and lip swelling   Ondansetron Hcl Hives   Redness and hives post IV admin on 07/05/17   Codeine Nausea And Vomiting   Morphine Hives   Redness and hives noted post IV admin on 07/05/17      Medication List       Accurate as of February 03, 2019  9:27 AM. Always use your most recent med list.        acetaminophen 325 MG tablet Commonly known as:  TYLENOL Take 650 mg by mouth.   albuterol 108 (90 Base) MCG/ACT inhaler Commonly known as:  PROVENTIL HFA;VENTOLIN HFA Inhale 2 puffs into the lungs every 6 (six) hours as needed for wheezing or shortness of breath.   amLODipine 10 MG tablet Commonly known as:  NORVASC Take 1 tablet (10 mg total) by mouth daily.   aspirin EC 81 MG tablet Take 1 tablet (81 mg total) by mouth daily.   carvedilol 25 MG tablet Commonly known as:  COREG Take 1 tablet (25 mg  total) by mouth 2 (two) times daily with a meal.   cyclobenzaprine 10 MG tablet Commonly known as:  FLEXERIL Take 10 mg by mouth as needed.   dexamethasone 4 MG tablet Commonly known as:  DECADRON Take 5 pills at one time with food ONCE a WEEK!!   dicyclomine 20 MG tablet Commonly known as:  Bentyl Take 1 tablet (20 mg total) by mouth 3 (three) times daily as needed for spasms.   erythromycin ophthalmic ointment Place a 1/2 inch ribbon of ointment into the lower eyelid.   famciclovir 250 MG tablet Commonly known as:  FAMVIR Take 1 tablet (250 mg total) by mouth daily.   famotidine 40 MG/5ML suspension Commonly known as:  Pepcid Take 5 mLs (40 mg total) by mouth at bedtime.   fluticasone 50 MCG/ACT nasal spray Commonly known as:  FLONASE Place 2 sprays into both nostrils daily.   HYDROcodone-acetaminophen 5-325 MG tablet Commonly known as:  NORCO/VICODIN Take 1 tablet by mouth every 4 (four) hours as needed for moderate pain or severe pain.   hyoscyamine 0.125 MG SL tablet Commonly known as:  LEVSIN SL Place 1 tablet (0.125 mg total) under the tongue every 4 (four) hours as needed.   lenalidomide  20 MG capsule Commonly known as:  Revlimid Take 1 pill daily for 3 weeks on and 1 week off.  Take at bedtime Auth# [id number]   levocetirizine 5 MG tablet Commonly known as:  XYZAL Take 1 tablet (5 mg total) by mouth every evening.   lidocaine 5 % Commonly known as:  Lidoderm Place 1 patch onto the skin daily. Remove & Discard patch within 12 hours or as directed by MD   MULTIVITAMIN ADULT PO Take by mouth.   mupirocin ointment 2 % Commonly known as:  Bactroban Apply 1 application topically 2 (two) times daily.   ondansetron 4 MG tablet Commonly known as:  Zofran Take 1 tablet (4 mg total) by mouth every 8 (eight) hours as needed for nausea or vomiting.   pantoprazole 40 MG tablet Commonly known as:  PROTONIX Take 1 tablet (40 mg total) by mouth 2 (two) times daily.    potassium chloride SA 20 MEQ tablet Commonly known as:  Klor-Con M20 Take 1 tablet (20 mEq total) by mouth daily.   potassium chloride SA 20 MEQ tablet Commonly known as:  K-DUR,KLOR-CON Take 2 tablets (40 mEq total) by mouth once for 1 dose.   Premarin vaginal cream Generic drug:  conjugated estrogens Place vaginally.   prochlorperazine 10 MG tablet Commonly known as:  COMPAZINE Take 1 tablet (10 mg total) by mouth every 6 (six) hours as needed (Nausea or vomiting).   sucralfate 1 g tablet Commonly known as:  Carafate Take 1 tablet (1 g total) by mouth 4 (four) times daily -  with meals and at bedtime.   topiramate 25 MG tablet Commonly known as:  Topamax Take 1 tablet (25 mg total) by mouth 2 (two) times daily.   triamterene-hydrochlorothiazide 37.5-25 MG tablet Commonly known as:  Maxzide-25 Take 1 tablet by mouth daily.   trimethoprim-polymyxin b ophthalmic solution Commonly known as:  Polytrim Place 1 drop into both eyes 3 (three) times daily as needed.       Allergies:  Allergies  Allergen Reactions  . Benazepril Anaphylaxis and Swelling    angioedema Throat and lip swelling  . Ondansetron Hcl Hives    Redness and hives post IV admin on 07/05/17  . Codeine Nausea And Vomiting  . Morphine Hives    Redness and hives noted post IV admin on 07/05/17    Past Medical History, Surgical history, Social history, and Family History were reviewed and updated.  Review of Systems: Review of Systems  Constitutional: Negative.   HENT: Negative.   Eyes: Negative.   Respiratory: Negative.   Cardiovascular: Negative.   Gastrointestinal: Negative.   Genitourinary: Negative.   Musculoskeletal: Negative.   Skin: Positive for rash.  Neurological: Negative.   Endo/Heme/Allergies: Negative.   Psychiatric/Behavioral: Negative.      Physical Exam:  weight is 177 lb 12 oz (80.6 kg). Her oral temperature is 98.1 F (36.7 C). Her blood pressure is 124/90 and her  pulse is 69. Her respiration is 17 and oxygen saturation is 99%.   Wt Readings from Last 3 Encounters:  02/03/19 177 lb 12 oz (80.6 kg)  01/24/19 188 lb 2 oz (85.3 kg)  01/06/19 184 lb (83.5 kg)    Physical Exam Vitals signs reviewed.  HENT:     Head: Normocephalic and atraumatic.  Eyes:     Pupils: Pupils are equal, round, and reactive to light.  Neck:     Musculoskeletal: Normal range of motion.  Cardiovascular:     Rate and Rhythm:  Normal rate and regular rhythm.     Heart sounds: Normal heart sounds.  Pulmonary:     Effort: Pulmonary effort is normal.     Breath sounds: Normal breath sounds.  Abdominal:     General: Bowel sounds are normal.     Palpations: Abdomen is soft.  Musculoskeletal: Normal range of motion.        General: No tenderness or deformity.  Lymphadenopathy:     Cervical: No cervical adenopathy.  Skin:    General: Skin is warm and dry.     Findings: No erythema or rash.  Neurological:     Mental Status: She is alert and oriented to person, place, and time.  Psychiatric:        Behavior: Behavior normal.        Thought Content: Thought content normal.        Judgment: Judgment normal.      Lab Results  Component Value Date   WBC 2.9 (L) 02/03/2019   HGB 12.0 02/03/2019   HCT 37.6 02/03/2019   MCV 91.0 02/03/2019   PLT 149 (L) 02/03/2019   Lab Results  Component Value Date   FERRITIN 169 09/16/2016   IRON 59 09/16/2016   TIBC 281 09/16/2016   UIBC 222 09/16/2016   IRONPCTSAT 21 09/16/2016   Lab Results  Component Value Date   RETICCTPCT 0.8 07/16/2016   RBC 4.13 02/03/2019   RETICCTABS 32,640 07/16/2016   Lab Results  Component Value Date   KPAFRELGTCHN 38.2 (H) 01/06/2019   LAMBDASER 12.2 01/06/2019   KAPLAMBRATIO 3.13 (H) 01/06/2019   Lab Results  Component Value Date   IGGSERUM 2,289 (H) 01/06/2019   IGA 222 01/06/2019   IGMSERUM 96 01/06/2019   Lab Results  Component Value Date   TOTALPROTELP 7.4 01/06/2019    ALBUMINELP 2.9 01/06/2019   A1GS 0.4 01/06/2019   A2GS 1.4 (H) 01/06/2019   BETS 1.2 01/06/2019   BETA2SER 0.5 07/16/2016   GAMS 1.5 01/06/2019   MSPIKE Not Observed 01/06/2019   SPEI Comment 10/07/2018     Chemistry      Component Value Date/Time   NA 142 01/20/2019 0806   NA 143 11/08/2017 1127   NA 140 12/31/2016 1000   K 3.1 (L) 01/20/2019 0806   K 4.0 11/08/2017 1127   K 3.6 12/31/2016 1000   CL 107 01/20/2019 0806   CL 107 11/08/2017 1127   CO2 25 01/20/2019 0806   CO2 25 11/08/2017 1127   CO2 24 12/31/2016 1000   BUN 15 01/20/2019 0806   BUN 19 11/08/2017 1127   BUN 22.3 12/31/2016 1000   CREATININE 0.78 01/20/2019 0806   CREATININE 0.9 11/08/2017 1127   CREATININE 0.8 12/31/2016 1000      Component Value Date/Time   CALCIUM 9.2 01/20/2019 0806   CALCIUM 9.9 11/08/2017 1127   CALCIUM 9.5 12/31/2016 1000   ALKPHOS 42 01/20/2019 0806   ALKPHOS 44 11/08/2017 1127   ALKPHOS 51 12/31/2016 1000   AST 18 01/20/2019 0806   AST 28 12/31/2016 1000   ALT 15 01/20/2019 0806   ALT 36 11/08/2017 1127   ALT 16 12/31/2016 1000   BILITOT 0.5 01/20/2019 0806   BILITOT 0.46 12/31/2016 1000      Impression and Plan: Ms. Stingley is a very pleasant 63 yo African American female with a previous IgG kappa MGUS with progression to myeloma.    I am very grateful to Dr. Lyndel Safe for seeing Ms. Brigitte Pulse.  He really  helped her.  It is no surprise that he was able to help her.  He has done a great job with a lot of our patients.  We will have her take the Velcade.  She gets Velcade every 2 weeks.  We will have her come back in 2 weeks for follow-up.  We will continue to monitor her myeloma levels.  I would think that she would start the pomalidomide in a week.      [name], MD 3/27/20209:27 AM

## 2019-02-03 NOTE — Patient Instructions (Signed)

## 2019-02-03 NOTE — Progress Notes (Signed)
Ok to treat per Dr. Ennever 

## 2019-02-03 NOTE — Progress Notes (Signed)
l °

## 2019-02-04 LAB — IGG, IGA, IGM
IgA: 250 mg/dL (ref 87–352)
IgG (Immunoglobin G), Serum: 2685 mg/dL — ABNORMAL HIGH (ref 700–1600)
IgM (Immunoglobulin M), Srm: 107 mg/dL (ref 26–217)

## 2019-02-06 LAB — GASTROINTESTINAL PATHOGEN PANEL PCR
C. difficile Tox A/B, PCR: NOT DETECTED
Campylobacter, PCR: NOT DETECTED
Cryptosporidium, PCR: NOT DETECTED
E coli (ETEC) LT/ST PCR: NOT DETECTED
E coli (STEC) stx1/stx2, PCR: NOT DETECTED
E coli 0157, PCR: NOT DETECTED
Giardia lamblia, PCR: NOT DETECTED
Norovirus, PCR: NOT DETECTED
ROTAVIRUS, PCR: NOT DETECTED
Salmonella, PCR: NOT DETECTED
Shigella, PCR: NOT DETECTED

## 2019-02-06 LAB — FECAL LACTOFERRIN, QUANT
Fecal Lactoferrin: NEGATIVE
MICRO NUMBER:: 358744
SPECIMEN QUALITY:: ADEQUATE

## 2019-02-06 LAB — KAPPA/LAMBDA LIGHT CHAINS
KAPPA FREE LGHT CHN: 46.6 mg/L — AB (ref 3.3–19.4)
Kappa, lambda light chain ratio: 4.36 — ABNORMAL HIGH (ref 0.26–1.65)
Lambda free light chains: 10.7 mg/L (ref 5.7–26.3)

## 2019-02-07 ENCOUNTER — Telehealth: Payer: Self-pay | Admitting: Pharmacist

## 2019-02-07 LAB — PROTEIN ELECTROPHORESIS, SERUM, WITH REFLEX
A/G Ratio: 0.8 (ref 0.7–1.7)
Albumin ELP: 3.6 g/dL (ref 2.9–4.4)
Alpha-1-Globulin: 0.2 g/dL (ref 0.0–0.4)
Alpha-2-Globulin: 1 g/dL (ref 0.4–1.0)
Beta Globulin: 1 g/dL (ref 0.7–1.3)
Gamma Globulin: 2.3 g/dL — ABNORMAL HIGH (ref 0.4–1.8)
Globulin, Total: 4.6 g/dL — ABNORMAL HIGH (ref 2.2–3.9)
M-SPIKE, %: 1.4 g/dL — AB
SPEP Interpretation: 0
Total Protein ELP: 8.2 g/dL (ref 6.0–8.5)

## 2019-02-07 LAB — IMMUNOFIXATION REFLEX, SERUM
IgA: 277 mg/dL (ref 87–352)
IgG (Immunoglobin G), Serum: 2962 mg/dL — ABNORMAL HIGH (ref 586–1602)
IgM (Immunoglobulin M), Srm: 115 mg/dL (ref 26–217)

## 2019-02-07 NOTE — Telephone Encounter (Signed)
Oral Oncology Pharmacist Encounter  Received new prescription for Pomalyst (pomalidomide) for the treatment of multiple myeloma in conjunction with Velcade and dexamethasone, planned duration until disease progression or unacceptable drug toxicity.  CBC/CMP from 02/03/2019 assessed, no relevant lab abnormalities. Prescription dose and frequency assessed.   Current medication list in Epic reviewed, several DDIs with pomalidomide identified: - CNS Depressants like pomalidomide may enhance the adverse/toxic effect of other CNS depressants. Ms. Klasen other CNS depressants are Norco, cyclobenzaprine, levocetirizine, prochlorperazine, and topiramate. Ms. Ebers should be monitored for signs of CNS depressions.  Prescription has been e-scribed to the [facility] for benefits analysis and approval.  Oral Oncology Clinic will continue to follow for insurance authorization, copayment issues, initial counseling and start date.  Darl Pikes, PharmD, BCPS, [facility] Hematology/Oncology Clinical Pharmacist [facility] [place] [facility] [phone]  02/07/2019 11:25 AM

## 2019-02-07 NOTE — Telephone Encounter (Signed)
Oral Oncology Pharmacist Encounter   Received notification from [employer] that prior authorization for Pomalyst is required.   PA submitted on CMM Key [id number]  Status is pending   Oral Oncology Clinic will continue to follow.   Darl Pikes, PharmD, BCPS, [facility] Hematology/Oncology Clinical Pharmacist [facility] [place] [facility] [phone]  02/07/2019 11:08 AM

## 2019-02-07 NOTE — Telephone Encounter (Signed)
Oral Oncology Pharmacist Encounter   Prior Authorization for Pomalyst has been approved.     PA# [id number] Effective dates: 02/07/19 through 11/09/2019   Oral Oncology Clinic will continue to follow.   Darl Pikes, PharmD, BCPS. [facility] Hematology/Oncology Clinical Pharmacist [facility] [phone]  02/07/2019 1:45 PM

## 2019-02-08 ENCOUNTER — Other Ambulatory Visit: Payer: Self-pay | Admitting: *Deleted

## 2019-02-08 DIAGNOSIS — C9000 Multiple myeloma not having achieved remission: Secondary | ICD-10-CM

## 2019-02-08 MED ORDER — POMALIDOMIDE 3 MG PO CAPS: ORAL_CAPSULE | ORAL | 0 refills | Status: DC

## 2019-02-10 ENCOUNTER — Other Ambulatory Visit: Payer: Self-pay

## 2019-02-10 ENCOUNTER — Inpatient Hospital Stay: Payer: Self-pay

## 2019-02-17 ENCOUNTER — Inpatient Hospital Stay (HOSPITAL_BASED_OUTPATIENT_CLINIC_OR_DEPARTMENT_OTHER): Payer: Medicare Other | Admitting: Family

## 2019-02-17 ENCOUNTER — Inpatient Hospital Stay: Payer: Medicare Other | Attending: Hematology & Oncology

## 2019-02-17 ENCOUNTER — Other Ambulatory Visit: Payer: Self-pay

## 2019-02-17 ENCOUNTER — Telehealth: Payer: Self-pay | Admitting: Family

## 2019-02-17 ENCOUNTER — Encounter: Payer: Self-pay | Admitting: Family

## 2019-02-17 ENCOUNTER — Inpatient Hospital Stay: Payer: Medicare Other

## 2019-02-17 VITALS — BP 156/95 | HR 85 | Temp 98.0°F | Resp 18 | Wt 186.0 lb

## 2019-02-17 DIAGNOSIS — R202 Paresthesia of skin: Secondary | ICD-10-CM

## 2019-02-17 DIAGNOSIS — G8929 Other chronic pain: Secondary | ICD-10-CM | POA: Diagnosis not present

## 2019-02-17 DIAGNOSIS — M545 Low back pain: Secondary | ICD-10-CM

## 2019-02-17 DIAGNOSIS — C9000 Multiple myeloma not having achieved remission: Secondary | ICD-10-CM

## 2019-02-17 DIAGNOSIS — R200 Anesthesia of skin: Secondary | ICD-10-CM

## 2019-02-17 DIAGNOSIS — Z5112 Encounter for antineoplastic immunotherapy: Secondary | ICD-10-CM | POA: Insufficient documentation

## 2019-02-17 LAB — CBC WITH DIFFERENTIAL (CANCER CENTER ONLY)
Abs Immature Granulocytes: 0.01 10*3/uL (ref 0.00–0.07)
Basophils Absolute: 0 10*3/uL (ref 0.0–0.1)
Basophils Relative: 1 %
Eosinophils Absolute: 0.1 10*3/uL (ref 0.0–0.5)
Eosinophils Relative: 3 %
HCT: 40.2 % (ref 36.0–46.0)
Hemoglobin: 12.5 g/dL (ref 12.0–15.0)
Immature Granulocytes: 0 %
Lymphocytes Relative: 27 %
Lymphs Abs: 0.9 10*3/uL (ref 0.7–4.0)
MCH: 28.5 pg (ref 26.0–34.0)
MCHC: 31.1 g/dL (ref 30.0–36.0)
MCV: 91.6 fL (ref 80.0–100.0)
Monocytes Absolute: 0.2 10*3/uL (ref 0.1–1.0)
Monocytes Relative: 6 %
Neutro Abs: 1.9 10*3/uL (ref 1.7–7.7)
Neutrophils Relative %: 63 %
Platelet Count: 162 10*3/uL (ref 150–400)
RBC: 4.39 MIL/uL (ref 3.87–5.11)
RDW: 14.9 % (ref 11.5–15.5)
WBC Count: 3.1 10*3/uL — ABNORMAL LOW (ref 4.0–10.5)
nRBC: 0 % (ref 0.0–0.2)

## 2019-02-17 LAB — CMP (CANCER CENTER ONLY)
ALT: 13 U/L (ref 0–44)
AST: 20 U/L (ref 15–41)
Albumin: 4.3 g/dL (ref 3.5–5.0)
Alkaline Phosphatase: 51 U/L (ref 38–126)
Anion gap: 8 (ref 5–15)
BUN: 23 mg/dL (ref 8–23)
CO2: 26 mmol/L (ref 22–32)
Calcium: 9.8 mg/dL (ref 8.9–10.3)
Chloride: 105 mmol/L (ref 98–111)
Creatinine: 0.73 mg/dL (ref 0.44–1.00)
GFR, Est AFR Am: >60 mL/min (ref >60)
GFR, Estimated: >60 mL/min (ref >60)
Glucose, Bld: 90 mg/dL (ref 70–99)
Potassium: 3.5 mmol/L (ref 3.5–5.1)
Sodium: 139 mmol/L (ref 135–145)
Total Bilirubin: 0.5 mg/dL (ref 0.3–1.2)
Total Protein: 9.1 g/dL — ABNORMAL HIGH (ref 6.5–8.1)

## 2019-02-17 MED ORDER — PROCHLORPERAZINE MALEATE 10 MG PO TABS
10.0000 mg | ORAL_TABLET | Freq: Once | ORAL | Status: AC
  Administered 2019-02-17: 10 mg via ORAL

## 2019-02-17 MED ORDER — BORTEZOMIB CHEMO SQ INJECTION 3.5 MG (2.5MG/ML)
1.3000 mg/m2 | Freq: Once | INTRAMUSCULAR | Status: AC
  Administered 2019-02-17: 2.5 mg via SUBCUTANEOUS
  Filled 2019-02-17: qty 1

## 2019-02-17 MED ORDER — PROCHLORPERAZINE MALEATE 10 MG PO TABS
ORAL_TABLET | ORAL | Status: AC
  Filled 2019-02-17: qty 1

## 2019-02-17 NOTE — Patient Instructions (Signed)

## 2019-02-17 NOTE — Progress Notes (Signed)
Hematology and Oncology Follow Up Visit  Tami [name] [id number] 26-Jul-1956 63 y.o. 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 She was able to start the Pomalyst on Monday. Her diarrhea has resolved and she is doing well. She has some mild fatigue at times.   M-spike last month was 1.4, IgG level 2, 685 mg/dL and kappa light chains 4.66 mg/dL.  No fever, chills, n/v, cough, rash, dizziness, SOB, chest pain, palpitations, abdominal pain or changes in bladder habits.  No swelling or tenderness in her extremities.  She has chronic lower back issues she states due to arthritis and has intermittent tightness, numbness and tingling in the toes of her left foot.  No lymphadenopathy noted on exam.  She has maintained a good appetite and is staying hydrated. She supplements with Boost or Ensure when needed. Her weight is stable.   ECOG Performance Status: 1 - Symptomatic but completely ambulatory  Medications:  Allergies as of 02/17/2019      Reactions   Benazepril Anaphylaxis, Swelling   angioedema Throat and lip swelling   Ondansetron Hcl Hives   Redness and hives post IV admin on 07/05/17   Codeine Nausea And Vomiting   Morphine Hives   Redness and hives noted post IV admin on 07/05/17      Medication List       Accurate as of February 17, 2019  9:36 AM. Always use your most recent med list.        acetaminophen 325 MG tablet Commonly known as:  TYLENOL Take 650 mg by mouth.   albuterol 108 (90 Base) MCG/ACT inhaler Commonly known as:  PROVENTIL HFA;VENTOLIN HFA Inhale 2 puffs into the lungs every 6 (six) hours as needed for wheezing or shortness of breath.   amLODipine 10 MG tablet Commonly known as:  NORVASC Take 1  tablet (10 mg total) by mouth daily.   aspirin EC 81 MG tablet Take 1 tablet (81 mg total) by mouth daily.   carvedilol 25 MG tablet Commonly known as:  COREG Take 1 tablet (25 mg total) by mouth 2 (two) times daily with a meal.   cyclobenzaprine 10 MG tablet Commonly known as:  FLEXERIL Take 10 mg by mouth as needed.   dexamethasone 4 MG tablet Commonly known as:  DECADRON Take 5 pills at one time with food ONCE a WEEK!!   dicyclomine 20 MG tablet Commonly known as:  Bentyl Take 1 tablet (20 mg total) by mouth 3 (three) times daily as needed for spasms.   erythromycin ophthalmic ointment Place a 1/2 inch ribbon of ointment into the lower eyelid.   famciclovir 250 MG tablet Commonly known as:  FAMVIR Take 1 tablet (250 mg total) by mouth daily.   famotidine 40 MG/5ML suspension Commonly known as:  Pepcid Take 5 mLs (40 mg total) by mouth at bedtime.   fluticasone 50 MCG/ACT nasal spray Commonly known as:  FLONASE Place 2 sprays into both nostrils daily.   HYDROcodone-acetaminophen 5-325 MG tablet Commonly known as:  NORCO/VICODIN Take 1 tablet by mouth every 4 (four) hours as needed for moderate pain or severe pain.   hyoscyamine 0.125 MG SL tablet Commonly known as:  LEVSIN SL Place 1 tablet (0.125 mg total)  under the tongue every 4 (four) hours as needed.   levocetirizine 5 MG tablet Commonly known as:  XYZAL Take 1 tablet (5 mg total) by mouth every evening.   lidocaine 5 % Commonly known as:  Lidoderm Place 1 patch onto the skin daily. Remove & Discard patch within 12 hours or as directed by MD   MULTIVITAMIN ADULT PO Take by mouth.   mupirocin ointment 2 % Commonly known as:  Bactroban Apply 1 application topically 2 (two) times daily.   ondansetron 4 MG tablet Commonly known as:  Zofran Take 1 tablet (4 mg total) by mouth every 8 (eight) hours as needed for nausea or vomiting.   pantoprazole 40 MG tablet Commonly known as:  PROTONIX Take 1 tablet  (40 mg total) by mouth 2 (two) times daily.   pomalidomide 3 MG capsule Commonly known as:  POMALYST Take one capsule daily-Days #1-21. Repeat every 28 days. [id number]   potassium chloride SA 20 MEQ tablet Commonly known as:  Klor-Con M20 Take 1 tablet (20 mEq total) by mouth daily.   potassium chloride SA 20 MEQ tablet Commonly known as:  K-DUR,KLOR-CON Take 2 tablets (40 mEq total) by mouth once for 1 dose.   Premarin vaginal cream Generic drug:  conjugated estrogens Place vaginally.   prochlorperazine 10 MG tablet Commonly known as:  COMPAZINE Take 1 tablet (10 mg total) by mouth every 6 (six) hours as needed (Nausea or vomiting).   sucralfate 1 g tablet Commonly known as:  Carafate Take 1 tablet (1 g total) by mouth 4 (four) times daily -  with meals and at bedtime.   topiramate 25 MG tablet Commonly known as:  Topamax Take 1 tablet (25 mg total) by mouth 2 (two) times daily.   triamterene-hydrochlorothiazide 37.5-25 MG tablet Commonly known as:  Maxzide-25 Take 1 tablet by mouth daily.   trimethoprim-polymyxin b ophthalmic solution Commonly known as:  Polytrim Place 1 drop into both eyes 3 (three) times daily as needed.       Allergies:  Allergies  Allergen Reactions  . Benazepril Anaphylaxis and Swelling    angioedema Throat and lip swelling  . Ondansetron Hcl Hives    Redness and hives post IV admin on 07/05/17  . Codeine Nausea And Vomiting  . Morphine Hives    Redness and hives noted post IV admin on 07/05/17    Past Medical History, Surgical history, Social history, and Family History were reviewed and updated.  Review of Systems: All other 10 point review of systems is negative.   Physical Exam:  vitals were not taken for this visit.   Wt Readings from Last 3 Encounters:  02/03/19 177 lb 12 oz (80.6 kg)  01/24/19 188 lb 2 oz (85.3 kg)  01/06/19 184 lb (83.5 kg)    Ocular: Sclerae unicteric, pupils equal, round and reactive to light  Ear-nose-throat: Oropharynx clear, dentition fair Lymphatic: No cervical or supraclavicular adenopathy Lungs no rales or rhonchi, good excursion bilaterally Heart regular rate and rhythm, no murmur appreciated Abd soft, nontender, positive bowel sounds, no liver or spleen tip palpated on exam, no fluid wave  MSK no focal spinal tenderness, no joint edema Neuro: non-focal, well-oriented, appropriate affect Breasts: Deferred   Lab Results  Component Value Date   WBC 3.1 (L) 02/17/2019   HGB 12.5 02/17/2019   HCT 40.2 02/17/2019   MCV 91.6 02/17/2019   PLT 162 02/17/2019   Lab Results  Component Value Date   FERRITIN 169 09/16/2016  IRON 59 09/16/2016   TIBC 281 09/16/2016   UIBC 222 09/16/2016   IRONPCTSAT 21 09/16/2016   Lab Results  Component Value Date   RETICCTPCT 0.8 07/16/2016   RBC 4.39 02/17/2019   RETICCTABS 32,640 07/16/2016   Lab Results  Component Value Date   KPAFRELGTCHN 46.6 (H) 02/03/2019   LAMBDASER 10.7 02/03/2019   KAPLAMBRATIO 4.36 (H) 02/03/2019   Lab Results  Component Value Date   IGGSERUM 2,962 (H) 02/03/2019   IGA 277 02/03/2019   IGMSERUM 115 02/03/2019   Lab Results  Component Value Date   TOTALPROTELP 8.2 02/03/2019   ALBUMINELP 3.6 02/03/2019   A1GS 0.2 02/03/2019   A2GS 1.0 02/03/2019   BETS 1.0 02/03/2019   BETA2SER 0.5 07/16/2016   GAMS 2.3 (H) 02/03/2019   MSPIKE 1.4 (H) 02/03/2019   SPEI Comment 10/07/2018     Chemistry      Component Value Date/Time   NA 139 02/03/2019 0844   NA 143 11/08/2017 1127   NA 140 12/31/2016 1000   K 3.5 02/03/2019 0844   K 4.0 11/08/2017 1127   K 3.6 12/31/2016 1000   CL 105 02/03/2019 0844   CL 107 11/08/2017 1127   CO2 25 02/03/2019 0844   CO2 25 11/08/2017 1127   CO2 24 12/31/2016 1000   BUN 20 02/03/2019 0844   BUN 19 11/08/2017 1127   BUN 22.3 12/31/2016 1000   CREATININE 0.82 02/03/2019 0844   CREATININE 0.9 11/08/2017 1127   CREATININE 0.8 12/31/2016 1000      Component  Value Date/Time   CALCIUM 9.7 02/03/2019 0844   CALCIUM 9.9 11/08/2017 1127   CALCIUM 9.5 12/31/2016 1000   ALKPHOS 51 02/03/2019 0844   ALKPHOS 44 11/08/2017 1127   ALKPHOS 51 12/31/2016 1000   AST 21 02/03/2019 0844   AST 28 12/31/2016 1000   ALT 15 02/03/2019 0844   ALT 36 11/08/2017 1127   ALT 16 12/31/2016 1000   BILITOT 0.6 02/03/2019 0844   BILITOT 0.46 12/31/2016 1000       Impression and Plan: Tami [name] is a very pleasant 63 yo African American female with IgG kappa myeloma. She started Pomalyst earlier this week and is doing well.  We will proceed with Velcade today as planned. She will continue her every 2 week schedule.  Myeloma studies are pending. She will contact our office with any questions or concerns. We can certainly see her sooner if need be.   Laverna Peace, NP 4/10/20209:36 AM

## 2019-02-17 NOTE — Telephone Encounter (Signed)
Has follow-up and current schedule per 4/10 los

## 2019-02-18 LAB — IGG, IGA, IGM
IgA: 256 mg/dL (ref 87–352)
IgG (Immunoglobin G), Serum: 2714 mg/dL — ABNORMAL HIGH (ref 586–1602)
IgM (Immunoglobulin M), Srm: 112 mg/dL (ref 26–217)

## 2019-02-20 LAB — KAPPA/LAMBDA LIGHT CHAINS
Kappa free light chain: 48.4 mg/L — ABNORMAL HIGH (ref 3.3–19.4)
Kappa, lambda light chain ratio: 4.75 — ABNORMAL HIGH (ref 0.26–1.65)
Lambda free light chains: 10.2 mg/L (ref 5.7–26.3)

## 2019-02-21 ENCOUNTER — Ambulatory Visit: Payer: Self-pay | Admitting: Family Medicine

## 2019-02-21 DIAGNOSIS — J40 Bronchitis, not specified as acute or chronic: Secondary | ICD-10-CM | POA: Diagnosis not present

## 2019-02-22 ENCOUNTER — Other Ambulatory Visit: Payer: Self-pay

## 2019-02-22 ENCOUNTER — Ambulatory Visit: Payer: Medicare Other | Admitting: Family Medicine

## 2019-02-22 ENCOUNTER — Ambulatory Visit (INDEPENDENT_AMBULATORY_CARE_PROVIDER_SITE_OTHER): Payer: Medicare Other | Admitting: Family Medicine

## 2019-02-22 DIAGNOSIS — L509 Urticaria, unspecified: Secondary | ICD-10-CM | POA: Insufficient documentation

## 2019-02-22 DIAGNOSIS — C9000 Multiple myeloma not having achieved remission: Secondary | ICD-10-CM

## 2019-02-22 DIAGNOSIS — J302 Other seasonal allergic rhinitis: Secondary | ICD-10-CM | POA: Diagnosis not present

## 2019-02-22 LAB — PROTEIN ELECTROPHORESIS, SERUM, WITH REFLEX
A/G Ratio: 0.7 (ref 0.7–1.7)
Albumin ELP: 3.7 g/dL (ref 2.9–4.4)
Alpha-1-Globulin: 0.3 g/dL (ref 0.0–0.4)
Alpha-2-Globulin: 1.1 g/dL — ABNORMAL HIGH (ref 0.4–1.0)
Beta Globulin: 1.2 g/dL (ref 0.7–1.3)
Gamma Globulin: 2.4 g/dL — ABNORMAL HIGH (ref 0.4–1.8)
Globulin, Total: 5 g/dL — ABNORMAL HIGH (ref 2.2–3.9)
M-Spike, %: 1.3 g/dL — ABNORMAL HIGH
SPEP Interpretation: 0
Total Protein ELP: 8.7 g/dL — ABNORMAL HIGH (ref 6.0–8.5)

## 2019-02-22 LAB — IMMUNOFIXATION REFLEX, SERUM
IgA: 297 mg/dL (ref 87–352)
IgG (Immunoglobin G), Serum: 3185 mg/dL — ABNORMAL HIGH (ref 586–1602)
IgM (Immunoglobulin M), Srm: 131 mg/dL (ref 26–217)

## 2019-02-22 MED ORDER — METHYLPREDNISOLONE 4 MG PO TABS: ORAL_TABLET | ORAL | 0 refills | Status: DC

## 2019-02-22 MED ORDER — FAMOTIDINE 20 MG PO TABS: 20.0000 mg | ORAL_TABLET | Freq: Two times a day (BID) | ORAL | 1 refills | Status: DC

## 2019-02-22 MED ORDER — CETIRIZINE HCL 10 MG PO TABS: 10.0000 mg | ORAL_TABLET | ORAL | 1 refills | Status: DC

## 2019-02-22 NOTE — Assessment & Plan Note (Signed)
Following still with oncology and doing well.

## 2019-02-22 NOTE — Assessment & Plan Note (Signed)
She was working in the yard yesterday and she came in with an itchy rash on legs and arm. She has had this before and responded to steroids in past. Will send in Medrol dose pak, she will start Cetirizine 10 mg in am and Benadryl in pm. Famotidine 20 mg po bid and clean areas with Witch Hazel Astringent prn. Report if no improvement

## 2019-02-22 NOTE — Assessment & Plan Note (Signed)
Encouraged Cetrizine 10 mg daily and Flonase.

## 2019-02-22 NOTE — Progress Notes (Signed)
Virtual Visit via Video Note  I connected with Tami [name] on 02/22/19 at 10:20 AM EDT by a video enabled telemedicine application and verified that I am speaking with the correct person using two identifiers.   I discussed the limitations of evaluation and management by telemedicine and the availability of in person appointments. The patient expressed understanding and agreed to proceed.    Subjective:    [name], female    DOB: 1956/01/06, 63 y.o.   MRN: [id number]  No chief complaint on file.   HPI Patient is in today for evaluation of an itchy rash that started on her arm and legs after she worked in her yard. She is afraid she got into some poison ivy. It burns some and itches. She has used steroids for this in the past with good response. She has taken some benadryl some which has made her sleepy but it has helped her itching temporarily some. No recent febrile illness or hospitalizations. She continues to follow with oncology. Denies CP/palp/SOB/HA/congestion/fevers/GI or GU c/o. Taking meds as prescribed  Past Medical History:  Diagnosis Date  . Abnormal SPEP 07/26/2016  . Arthritis    knees, hands  . Back pain 07/14/2016  . Bowel obstruction ([place]) 11/2014  . C. difficile diarrhea   . CHF (congestive heart failure) ([place])   . Colitis   . Congestive heart failure ([place]) 02/24/2015   S/p pacemaker  . Depression   . Elevated sed rate 08/15/2017  . Epigastric pain 03/22/2017  . GERD (gastroesophageal reflux disease)   . Hyperlipidemia   . Hypertension   . 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 Low back pain 07/14/2016  . 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 Multiple myeloma ([place]) 07/20/2018  . Multiple myeloma not having achieved remission ([place]) 07/20/2018  . Myalgia 12/31/2016  . Obstructive sleep apnea 03/31/2015  . SOB (shortness of breath) 04/09/2016  . Stroke ([place])    TIAs  . UTI (urinary tract infection)     Past Surgical  History:  Procedure Laterality Date  . ABDOMINAL HYSTERECTOMY     menorraghia, 2006, total  . CHOLECYSTECTOMY    . COLONOSCOPY  2018   [facility] per patient  . ESOPHAGOGASTRODUODENOSCOPY  2018   [facility]   . KNEE SURGERY     right, repair torn torn cartialage  . PACEMAKER INSERTION  08/2014  . TONSILLECTOMY    . TUBAL LIGATION      Family History  Problem Relation Age of Onset  . Diabetes Mother   . Hypertension Mother   . Heart disease Mother        s/p 1 stent  . Hyperlipidemia Mother   . Arthritis Mother   . Cancer Father        COLON  . Colon cancer Father 73  . Irritable bowel syndrome Sister   . Hyperlipidemia Daughter   . Hypertension Daughter   . Hypertension Maternal Grandmother   . Arthritis Maternal Grandmother   . Heart disease Maternal Grandfather        MI  . Hypertension Maternal Grandfather   . Arthritis Maternal Grandfather   . Hypertension Son   . Esophageal cancer Neg Hx     Social History   Socioeconomic History  . Marital status: Divorced    Spouse name: [name]  . Number of children: 3  . Years of education: [name]  . Highest education level: [name]  Occupational History  .  [name]  Social Needs  . Financial resource strain: [name]  . Food insecurity:    Worry: [name]    Inability: [name]  . Transportation needs:    Medical: [name]    Non-medical: [name]  Tobacco Use  . Smoking status: Never Smoker  . Smokeless tobacco: Never Used  Substance and Sexual Activity  . Alcohol use: No    Alcohol/week: 0.0 standard drinks  . Drug use: No  . Sexual activity: [name]  Lifestyle  . Physical activity:    Days per week: [name]    Minutes per session: [name]  . Stress: [name]  Relationships  . Social connections:    Talks on phone: [name]    Gets together: [name]    Attends religious service: [name]    Active member of club or  organization: [name]    Attends meetings of clubs or organizations: [name]    Relationship status: [name]  . Intimate partner violence:    Fear of current or ex partner: [name]    Emotionally abused: [name]    Physically abused: [name]    Forced sexual activity: [name]  Other Topics Concern  . [name]  Social History Narrative  . [name]    Outpatient Medications Prior to Visit  Medication Sig Dispense Refill  . acetaminophen (TYLENOL) 325 MG tablet Take 650 mg by mouth.    Marland Kitchen albuterol (PROVENTIL HFA;VENTOLIN HFA) 108 (90 Base) MCG/ACT inhaler Inhale 2 puffs into the lungs every 6 (six) hours as needed for wheezing or shortness of breath. 1 Inhaler 2  . amLODipine (NORVASC) 10 MG tablet Take 1 tablet (10 mg total) by mouth daily. 90 tablet 1  . aspirin EC 81 MG tablet Take 1 tablet (81 mg total) by mouth daily. 90 tablet 1  . carvedilol (COREG) 25 MG tablet Take 1 tablet (25 mg total) by mouth 2 (two) times daily with a meal. 180 tablet 1  . conjugated estrogens (PREMARIN) vaginal cream Place vaginally.    . cyclobenzaprine (FLEXERIL) 10 MG tablet Take 10 mg by mouth as needed.     Marland Kitchen dexamethasone (DECADRON) 4 MG tablet Take 5 pills at one time with food ONCE a WEEK!! 100 tablet 2  . dicyclomine (BENTYL) 20 MG tablet Take 1 tablet (20 mg total) by mouth 3 (three) times daily as needed for spasms. 90 tablet 1  . erythromycin ophthalmic ointment Place a 1/2 inch ribbon of ointment into the lower eyelid. (Patient not taking: Reported on 01/24/2019) 1 g 0  . famciclovir (FAMVIR) 250 MG tablet Take 1 tablet (250 mg total) by mouth daily. 30 tablet 12  . fluticasone (FLONASE) 50 MCG/ACT nasal spray Place 2 sprays into both nostrils daily. 16 g 1  . HYDROcodone-acetaminophen (NORCO/VICODIN) 5-325 MG tablet Take 1 tablet by mouth every 4 (four) hours as needed for moderate pain or severe pain. 20 tablet 0  . hyoscyamine (LEVSIN SL) 0.125 MG SL tablet  Place 1 tablet (0.125 mg total) under the tongue every 4 (four) hours as needed. 30 tablet 2  . levocetirizine (XYZAL) 5 MG tablet Take 1 tablet (5 mg total) by mouth every evening. 30 tablet 2  . lidocaine (LIDODERM) 5 % Place 1 patch onto the skin daily. Remove & Discard patch  within 12 hours or as directed by MD 30 patch 2  . Multiple Vitamins-Minerals (MULTIVITAMIN ADULT PO) Take by mouth.    . mupirocin ointment (BACTROBAN) 2 % Apply 1 application topically 2 (two) times daily. 22 g 0  . ondansetron (ZOFRAN) 4 MG tablet Take 1 tablet (4 mg total) by mouth every 8 (eight) hours as needed for nausea or vomiting. 30 tablet 1  . pantoprazole (PROTONIX) 40 MG tablet Take 1 tablet (40 mg total) by mouth 2 (two) times daily. 180 tablet 1  . pomalidomide (POMALYST) 3 MG capsule Take one capsule daily-Days #1-21. Repeat every 28 days. [id number] 21 capsule 0  . potassium chloride SA (K-DUR,KLOR-CON) 20 MEQ tablet Take 2 tablets (40 mEq total) by mouth once for 1 dose. 60 tablet 3  . potassium chloride SA (KLOR-CON M20) 20 MEQ tablet Take 1 tablet (20 mEq total) by mouth daily. (Patient not taking: Reported on 01/24/2019) 90 tablet 1  . prochlorperazine (COMPAZINE) 10 MG tablet Take 1 tablet (10 mg total) by mouth every 6 (six) hours as needed (Nausea or vomiting). 30 tablet 1  . sucralfate (CARAFATE) 1 g tablet Take 1 tablet (1 g total) by mouth 4 (four) times daily -  with meals and at bedtime. 120 tablet 0  . topiramate (TOPAMAX) 25 MG tablet Take 1 tablet (25 mg total) by mouth 2 (two) times daily. 180 tablet 0  . triamterene-hydrochlorothiazide (MAXZIDE-25) 37.5-25 MG tablet Take 1 tablet by mouth daily. 30 tablet 4  . trimethoprim-polymyxin b (POLYTRIM) ophthalmic solution Place 1 drop into both eyes 3 (three) times daily as needed. 10 mL 0  . famotidine (PEPCID) 40 MG/5ML suspension Take 5 mLs (40 mg total) by mouth at bedtime. (Patient not taking: Reported on 01/24/2019) 150 mL 5   No  facility-administered medications prior to visit.     Allergies  Allergen Reactions  . Benazepril Anaphylaxis and Swelling    angioedema Throat and lip swelling  . Ondansetron Hcl Hives    Redness and hives post IV admin on 07/05/17  . Codeine Nausea And Vomiting  . Morphine Hives    Redness and hives noted post IV admin on 07/05/17    Review of Systems  Constitutional: Negative for fever and malaise/fatigue.  HENT: Negative for congestion.   Eyes: Negative for blurred vision.  Respiratory: Negative for shortness of breath.   Cardiovascular: Negative for chest pain, palpitations and leg swelling.  Gastrointestinal: Negative for abdominal pain, blood in stool and nausea.  Genitourinary: Negative for dysuria and frequency.  Musculoskeletal: Negative for falls.  Skin: Positive for itching and rash.  Neurological: Negative for dizziness, loss of consciousness and headaches.  Endo/Heme/Allergies: Negative for environmental allergies.  Psychiatric/Behavioral: Negative for depression. The patient is not nervous/anxious.        Objective:    Physical Exam Vitals signs and nursing note reviewed.  Constitutional:      General: She is not in acute distress.    Appearance: Normal appearance. She is well-developed.  HENT:     Head: Normocephalic and atraumatic.     Nose: Nose normal.  Eyes:     General:        Right eye: No discharge.        Left eye: No discharge.  Pulmonary:     Effort: Pulmonary effort is normal.  Abdominal:     General: Bowel sounds are normal.     Palpations: Abdomen is soft.     Tenderness: There is no abdominal  tenderness.  Skin:    General: Skin is dry.     Findings: Rash present.     Comments: Fine erythematous rash lower leg  Neurological:     Mental Status: She is alert and oriented to person, place, and time.  Psychiatric:        Mood and Affect: Mood normal.        Behavior: Behavior normal.     There were no vitals taken for this visit.  Wt Readings from Last 3 Encounters:  02/17/19 186 lb (84.4 kg)  02/03/19 177 lb 12 oz (80.6 kg)  01/24/19 188 lb 2 oz (85.3 kg)    Diabetic Foot Exam - Simple   No data filed     Lab Results  Component Value Date   WBC 3.1 (L) 02/17/2019   HGB 12.5 02/17/2019   HCT 40.2 02/17/2019   PLT 162 02/17/2019   GLUCOSE 90 02/17/2019   CHOL 137 02/15/2018   TRIG 100.0 02/15/2018   HDL 45.90 02/15/2018   LDLCALC 72 02/15/2018   ALT 13 02/17/2019   AST 20 02/17/2019   NA 139 02/17/2019   K 3.5 02/17/2019   CL 105 02/17/2019   CREATININE 0.73 02/17/2019   BUN 23 02/17/2019   CO2 26 02/17/2019   TSH 1.79 02/15/2018   INR 0.90 07/28/2018    Lab Results  Component Value Date   TSH 1.79 02/15/2018   Lab Results  Component Value Date   WBC 3.1 (L) 02/17/2019   HGB 12.5 02/17/2019   HCT 40.2 02/17/2019   MCV 91.6 02/17/2019   PLT 162 02/17/2019   Lab Results  Component Value Date   NA 139 02/17/2019   K 3.5 02/17/2019   CHLORIDE 108 12/31/2016   CO2 26 02/17/2019   GLUCOSE 90 02/17/2019   BUN 23 02/17/2019   CREATININE 0.73 02/17/2019   BILITOT 0.5 02/17/2019   ALKPHOS 51 02/17/2019   AST 20 02/17/2019   ALT 13 02/17/2019   PROT 9.1 (H) 02/17/2019   ALBUMIN 4.3 02/17/2019   CALCIUM 9.8 02/17/2019   ANIONGAP 8 02/17/2019   EGFR >90 12/31/2016   GFR 93.98 12/02/2018   Lab Results  Component Value Date   CHOL 137 02/15/2018   Lab Results  Component Value Date   HDL 45.90 02/15/2018   Lab Results  Component Value Date   LDLCALC 72 02/15/2018   Lab Results  Component Value Date   TRIG 100.0 02/15/2018   Lab Results  Component Value Date   CHOLHDL 3 02/15/2018   No results found for: HGBA1C     Assessment & Plan:   Problem List Items Addressed This Visit    Multiple myeloma not having achieved remission ([place]) (Chronic)    Following still with oncology and doing well.       Relevant Medications   methylPREDNISolone (MEDROL) 4 MG tablet    Seasonal allergies    Encouraged Cetrizine 10 mg daily and Flonase.       Urticaria    She was working in the yard yesterday and she came in with an itchy rash on legs and arm. She has had this before and responded to steroids in past. Will send in Medrol dose pak, she will start Cetirizine 10 mg in am and Benadryl in pm. Famotidine 20 mg po bid and clean areas with Witch Hazel Astringent prn. Report if no improvement         I have discontinued Caress Reffitt famotidine. I am  also having her start on methylPREDNISolone, famotidine, and cetirizine. Additionally, I am having her maintain her conjugated estrogens, acetaminophen, carvedilol, pantoprazole, aspirin EC, dicyclomine, ondansetron, lidocaine, topiramate, levocetirizine, sucralfate, famciclovir, hyoscyamine, prochlorperazine, dexamethasone, amLODipine, fluticasone, albuterol, cyclobenzaprine, potassium chloride SA, potassium chloride SA, trimethoprim-polymyxin b, mupirocin ointment, Multiple Vitamins-Minerals (MULTIVITAMIN ADULT PO), erythromycin, HYDROcodone-acetaminophen, triamterene-hydrochlorothiazide, and pomalidomide.  Meds ordered this encounter  Medications  . methylPREDNISolone (MEDROL) 4 MG tablet    Sig: 5 tab po qd X 1d then 4 tab po qd X 1d then 3 tab po qd X 1d then 2 tab po qd then 1 tab po qd    Dispense:  15 tablet    Refill:  0  . famotidine (PEPCID) 20 MG tablet    Sig: Take 1 tablet (20 mg total) by mouth 2 (two) times daily.    Dispense:  60 tablet    Refill:  1  . cetirizine (ZYRTEC) 10 MG tablet    Sig: Take 1 tablet (10 mg total) by mouth every morning.    Dispense:  30 tablet    Refill:  1     I discussed the assessment and treatment plan with the patient. The patient was provided an opportunity to ask questions and all were answered. The patient agreed with the plan and demonstrated an understanding of the instructions.   The patient was advised to call back or seek an in-person evaluation if the  symptoms worsen or if the condition fails to improve as anticipated.  I provided 15 minutes of non-face-to-face time during this encounter.   Penni Homans, MD

## 2019-02-23 ENCOUNTER — Ambulatory Visit (INDEPENDENT_AMBULATORY_CARE_PROVIDER_SITE_OTHER): Payer: Medicare Other | Admitting: Family Medicine

## 2019-02-23 ENCOUNTER — Other Ambulatory Visit: Payer: Self-pay

## 2019-02-23 DIAGNOSIS — L509 Urticaria, unspecified: Secondary | ICD-10-CM

## 2019-02-24 ENCOUNTER — Inpatient Hospital Stay: Payer: Medicare Other

## 2019-02-24 ENCOUNTER — Inpatient Hospital Stay (HOSPITAL_BASED_OUTPATIENT_CLINIC_OR_DEPARTMENT_OTHER): Payer: Medicare Other | Admitting: Hematology & Oncology

## 2019-02-24 ENCOUNTER — Other Ambulatory Visit: Payer: Self-pay

## 2019-02-24 VITALS — BP 142/79 | HR 71 | Temp 97.7°F | Resp 18 | Ht 67.0 in | Wt 186.5 lb

## 2019-02-24 VITALS — BP 153/75 | HR 72 | Temp 97.0°F | Resp 18

## 2019-02-24 DIAGNOSIS — C9000 Multiple myeloma not having achieved remission: Secondary | ICD-10-CM

## 2019-02-24 DIAGNOSIS — Z5112 Encounter for antineoplastic immunotherapy: Secondary | ICD-10-CM | POA: Diagnosis not present

## 2019-02-24 LAB — CBC WITH DIFFERENTIAL (CANCER CENTER ONLY)
Abs Immature Granulocytes: 0.02 10*3/uL (ref 0.00–0.07)
Basophils Absolute: 0 10*3/uL (ref 0.0–0.1)
Basophils Relative: 0 %
Eosinophils Absolute: 0.1 10*3/uL (ref 0.0–0.5)
Eosinophils Relative: 1 %
HCT: 35.9 % — ABNORMAL LOW (ref 36.0–46.0)
Hemoglobin: 11.1 g/dL — ABNORMAL LOW (ref 12.0–15.0)
Immature Granulocytes: 1 %
Lymphocytes Relative: 24 %
Lymphs Abs: 0.9 10*3/uL (ref 0.7–4.0)
MCH: 28.7 pg (ref 26.0–34.0)
MCHC: 30.9 g/dL (ref 30.0–36.0)
MCV: 92.8 fL (ref 80.0–100.0)
Monocytes Absolute: 0.3 10*3/uL (ref 0.1–1.0)
Monocytes Relative: 8 %
Neutro Abs: 2.4 10*3/uL (ref 1.7–7.7)
Neutrophils Relative %: 66 %
Platelet Count: 104 10*3/uL — ABNORMAL LOW (ref 150–400)
RBC: 3.87 MIL/uL (ref 3.87–5.11)
RDW: 14.9 % (ref 11.5–15.5)
WBC Count: 3.6 10*3/uL — ABNORMAL LOW (ref 4.0–10.5)
nRBC: 0 % (ref 0.0–0.2)

## 2019-02-24 LAB — CMP (CANCER CENTER ONLY)
ALT: 12 U/L (ref 0–44)
AST: 16 U/L (ref 15–41)
Albumin: 3.8 g/dL (ref 3.5–5.0)
Alkaline Phosphatase: 45 U/L (ref 38–126)
Anion gap: 7 (ref 5–15)
BUN: 25 mg/dL — ABNORMAL HIGH (ref 8–23)
CO2: 27 mmol/L (ref 22–32)
Calcium: 9.8 mg/dL (ref 8.9–10.3)
Chloride: 106 mmol/L (ref 98–111)
Creatinine: 0.76 mg/dL (ref 0.44–1.00)
GFR, Est AFR Am: >60 mL/min (ref >60)
GFR, Estimated: >60 mL/min (ref >60)
Glucose, Bld: 102 mg/dL — ABNORMAL HIGH (ref 70–99)
Potassium: 4.1 mmol/L (ref 3.5–5.1)
Sodium: 140 mmol/L (ref 135–145)
Total Bilirubin: 0.5 mg/dL (ref 0.3–1.2)
Total Protein: 7.8 g/dL (ref 6.5–8.1)

## 2019-02-24 MED ORDER — PROCHLORPERAZINE MALEATE 10 MG PO TABS
10.0000 mg | ORAL_TABLET | Freq: Once | ORAL | Status: AC
  Administered 2019-02-24: 10 mg via ORAL

## 2019-02-24 MED ORDER — PROCHLORPERAZINE MALEATE 10 MG PO TABS
ORAL_TABLET | ORAL | Status: AC
  Filled 2019-02-24: qty 1

## 2019-02-24 MED ORDER — BORTEZOMIB CHEMO SQ INJECTION 3.5 MG (2.5MG/ML)
1.3000 mg/m2 | Freq: Once | INTRAMUSCULAR | Status: AC
  Administered 2019-02-24: 2.5 mg via SUBCUTANEOUS
  Filled 2019-02-24: qty 1

## 2019-02-24 NOTE — Progress Notes (Signed)
Hematology and Oncology Follow Up Visit  Tami [name] [id number] November 25, 1955 63 y.o. 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 is here today for for follow-up.  I does want to see her so I can just talk to make sure she was doing okay..  She started the pomalidomide about a week or so ago.  She is doing well with this.  She is not having diarrhea.  She is having no problems with cough.  There is no fever.  She has had no bleeding.  She has had no leg swelling.  Her last myeloma studies showed an M spike of 1.3 g/dL.  Her IgG level was 2700 mg/dL.  Her kappa light chain was 4.8 mg/dL.  I thought maybe we would have to make a change in her Velcade.  However, I am willing to wait to see if the pomalidomide is going to help her.  Overall, her performance status is ECOG 1.  Medications:  Allergies as of 02/24/2019      Reactions   Benazepril Anaphylaxis, Swelling   angioedema Throat and lip swelling   Ondansetron Hcl Hives   Redness and hives post IV admin on 07/05/17   Codeine Nausea And Vomiting   Morphine Hives   Redness and hives noted post IV admin on 07/05/17      Medication List       Accurate as of February 24, 2019  3:48 PM. Always use your most recent med list.        acetaminophen 325 MG tablet Commonly known as:  TYLENOL Take 650 mg by mouth.   albuterol 108 (90 Base) MCG/ACT inhaler Commonly known as:  VENTOLIN HFA Inhale 2 puffs into the lungs every 6 (six) hours as needed for wheezing or shortness of breath.   amLODipine 10 MG tablet Commonly known as:  NORVASC Take 1 tablet (10 mg total) by mouth daily.   aspirin EC 81 MG tablet Take 1 tablet (81 mg total) by mouth daily.   carvedilol 25 MG tablet Commonly known as:  COREG Take 1 tablet  (25 mg total) by mouth 2 (two) times daily with a meal.   cetirizine 10 MG tablet Commonly known as:  ZYRTEC Take 1 tablet (10 mg total) by mouth every morning.   cyclobenzaprine 10 MG tablet Commonly known as:  FLEXERIL Take 10 mg by mouth as needed.   dexamethasone 4 MG tablet Commonly known as:  DECADRON Take 5 pills at one time with food ONCE a WEEK!!   dicyclomine 20 MG tablet Commonly known as:  Bentyl Take 1 tablet (20 mg total) by mouth 3 (three) times daily as needed for spasms.   erythromycin ophthalmic ointment Place a 1/2 inch ribbon of ointment into the lower eyelid.   famciclovir 250 MG tablet Commonly known as:  FAMVIR Take 1 tablet (250 mg total) by mouth daily.   famotidine 20 MG tablet Commonly known as:  Pepcid Take 1 tablet (20 mg total) by mouth 2 (two) times daily.   fluticasone 50 MCG/ACT nasal spray Commonly known as:  FLONASE Place 2 sprays into both nostrils daily.   HYDROcodone-acetaminophen 5-325 MG tablet Commonly known as:  NORCO/VICODIN Take 1 tablet by mouth every 4 (four) hours as needed for moderate pain or severe pain.  hyoscyamine 0.125 MG SL tablet Commonly known as:  LEVSIN SL Place 1 tablet (0.125 mg total) under the tongue every 4 (four) hours as needed.   levocetirizine 5 MG tablet Commonly known as:  XYZAL Take 1 tablet (5 mg total) by mouth every evening.   lidocaine 5 % Commonly known as:  Lidoderm Place 1 patch onto the skin daily. Remove & Discard patch within 12 hours or as directed by MD   methylPREDNISolone 4 MG tablet Commonly known as:  Medrol 5 tab po qd X 1d then 4 tab po qd X 1d then 3 tab po qd X 1d then 2 tab po qd then 1 tab po qd   MULTIVITAMIN ADULT PO Take by mouth.   mupirocin ointment 2 % Commonly known as:  Bactroban Apply 1 application topically 2 (two) times daily.   ondansetron 4 MG tablet Commonly known as:  Zofran Take 1 tablet (4 mg total) by mouth every 8 (eight) hours as needed for  nausea or vomiting.   pantoprazole 40 MG tablet Commonly known as:  PROTONIX Take 1 tablet (40 mg total) by mouth 2 (two) times daily.   pomalidomide 3 MG capsule Commonly known as:  POMALYST Take one capsule daily-Days #1-21. Repeat every 28 days. [id number]   potassium chloride SA 20 MEQ tablet Commonly known as:  Klor-Con M20 Take 1 tablet (20 mEq total) by mouth daily.   potassium chloride SA 20 MEQ tablet Commonly known as:  K-DUR Take 2 tablets (40 mEq total) by mouth once for 1 dose.   Premarin vaginal cream Generic drug:  conjugated estrogens Place vaginally.   prochlorperazine 10 MG tablet Commonly known as:  COMPAZINE Take 1 tablet (10 mg total) by mouth every 6 (six) hours as needed (Nausea or vomiting).   sucralfate 1 g tablet Commonly known as:  Carafate Take 1 tablet (1 g total) by mouth 4 (four) times daily -  with meals and at bedtime.   topiramate 25 MG tablet Commonly known as:  Topamax Take 1 tablet (25 mg total) by mouth 2 (two) times daily.   triamterene-hydrochlorothiazide 37.5-25 MG tablet Commonly known as:  Maxzide-25 Take 1 tablet by mouth daily.   trimethoprim-polymyxin b ophthalmic solution Commonly known as:  Polytrim Place 1 drop into both eyes 3 (three) times daily as needed.       Allergies:  Allergies  Allergen Reactions   Benazepril Anaphylaxis and Swelling    angioedema Throat and lip swelling   Ondansetron Hcl Hives    Redness and hives post IV admin on 07/05/17   Codeine Nausea And Vomiting   Morphine Hives    Redness and hives noted post IV admin on 07/05/17    Past Medical History, Surgical history, Social history, and Family History were reviewed and updated.  Review of Systems: Review of Systems  Constitutional: Negative.   HENT: Negative.   Eyes: Negative.   Respiratory: Negative.   Cardiovascular: Negative.   Gastrointestinal: Negative.   Genitourinary: Negative.   Musculoskeletal: Negative.   Skin:  Positive for rash.  Neurological: Negative.   Endo/Heme/Allergies: Negative.   Psychiatric/Behavioral: Negative.      Physical Exam:  height is 5\' 7"  (1.702 m) and weight is 186 lb 8 oz (84.6 kg). Her oral temperature is 97.7 F (36.5 C). Her blood pressure is 142/79 (abnormal) and her pulse is 71. Her respiration is 18 and oxygen saturation is 99%.   Wt Readings from Last 3 Encounters:  02/24/19 186 lb 8  oz (84.6 kg)  02/17/19 186 lb (84.4 kg)  02/03/19 177 lb 12 oz (80.6 kg)    Physical Exam Vitals signs reviewed.  HENT:     Head: Normocephalic and atraumatic.  Eyes:     Pupils: Pupils are equal, round, and reactive to light.  Neck:     Musculoskeletal: Normal range of motion.  Cardiovascular:     Rate and Rhythm: Normal rate and regular rhythm.     Heart sounds: Normal heart sounds.  Pulmonary:     Effort: Pulmonary effort is normal.     Breath sounds: Normal breath sounds.  Abdominal:     General: Bowel sounds are normal.     Palpations: Abdomen is soft.  Musculoskeletal: Normal range of motion.        General: No tenderness or deformity.  Lymphadenopathy:     Cervical: No cervical adenopathy.  Skin:    General: Skin is warm and dry.     Findings: No erythema or rash.  Neurological:     Mental Status: She is alert and oriented to person, place, and time.  Psychiatric:        Behavior: Behavior normal.        Thought Content: Thought content normal.        Judgment: Judgment normal.      Lab Results  Component Value Date   WBC 3.6 (L) 02/24/2019   HGB 11.1 (L) 02/24/2019   HCT 35.9 (L) 02/24/2019   MCV 92.8 02/24/2019   PLT 104 (L) 02/24/2019   Lab Results  Component Value Date   FERRITIN 169 09/16/2016   IRON 59 09/16/2016   TIBC 281 09/16/2016   UIBC 222 09/16/2016   IRONPCTSAT 21 09/16/2016   Lab Results  Component Value Date   RETICCTPCT 0.8 07/16/2016   RBC 3.87 02/24/2019   RETICCTABS 32,640 07/16/2016   Lab Results  Component Value  Date   KPAFRELGTCHN 48.4 (H) 02/17/2019   LAMBDASER 10.2 02/17/2019   KAPLAMBRATIO 4.75 (H) 02/17/2019   Lab Results  Component Value Date   IGGSERUM 2,714 (H) 02/17/2019   IGGSERUM 3,185 (H) 02/17/2019   IGA 256 02/17/2019   IGA 297 02/17/2019   IGMSERUM 112 02/17/2019   IGMSERUM 131 02/17/2019   Lab Results  Component Value Date   TOTALPROTELP 8.7 (H) 02/17/2019   ALBUMINELP 3.7 02/17/2019   A1GS 0.3 02/17/2019   A2GS 1.1 (H) 02/17/2019   BETS 1.2 02/17/2019   BETA2SER 0.5 07/16/2016   GAMS 2.4 (H) 02/17/2019   MSPIKE 1.3 (H) 02/17/2019   SPEI Comment 10/07/2018     Chemistry      Component Value Date/Time   NA 140 02/24/2019 0902   NA 143 11/08/2017 1127   NA 140 12/31/2016 1000   K 4.1 02/24/2019 0902   K 4.0 11/08/2017 1127   K 3.6 12/31/2016 1000   CL 106 02/24/2019 0902   CL 107 11/08/2017 1127   CO2 27 02/24/2019 0902   CO2 25 11/08/2017 1127   CO2 24 12/31/2016 1000   BUN 25 (H) 02/24/2019 0902   BUN 19 11/08/2017 1127   BUN 22.3 12/31/2016 1000   CREATININE 0.76 02/24/2019 0902   CREATININE 0.9 11/08/2017 1127   CREATININE 0.8 12/31/2016 1000      Component Value Date/Time   CALCIUM 9.8 02/24/2019 0902   CALCIUM 9.9 11/08/2017 1127   CALCIUM 9.5 12/31/2016 1000   ALKPHOS 45 02/24/2019 0902   ALKPHOS 44 11/08/2017 1127   ALKPHOS 51  12/31/2016 1000   AST 16 02/24/2019 0902   AST 28 12/31/2016 1000   ALT 12 02/24/2019 0902   ALT 36 11/08/2017 1127   ALT 16 12/31/2016 1000   BILITOT 0.5 02/24/2019 0902   BILITOT 0.46 12/31/2016 1000      Impression and Plan: Ms. Snare is a very pleasant 63 yo African American female with a previous IgG kappa MGUS with progression to myeloma.    I will wait until the end of May so we can see how she is doing with the pomalidomide.  Hopefully, this will work on getting her M spike down.    I will see her back in 4 weeks      [name], MD 4/17/20203:48 PM

## 2019-02-26 NOTE — Assessment & Plan Note (Signed)
She continues to have rash but she believes it is helping. No change in meds, call if worsens.

## 2019-02-26 NOTE — Progress Notes (Signed)
Virtual Visit via Video Note  I connected with Ercelle Winkles on 02/26/19 at  9:20 AM EDT by a video enabled telemedicine application and verified that I am speaking with the correct person using two identifiers.   I discussed the limitations of evaluation and management by telemedicine and the availability of in person appointments. The patient expressed understanding and agreed to proceed. Magdalene Molly, CMA was able to get patient set up on video platform    Subjective:    [name], female    DOB: [birth year], 63 y.o.   MRN: [id number]  No chief complaint on file.   HPI Patient is in today for follow up on her rash. She was exposed to poison ivy in her yard and develop a pruritic rash. We started steroids and antihistamines and she is improving. The rash is not worse and she denies any side effects from the medicine such as palpitations, polyuria, anxiety flare, etc.  Past Medical History:  Diagnosis Date  . Abnormal SPEP 07/26/2016  . Arthritis    knees, hands  . Back pain 07/14/2016  . Bowel obstruction ([place]) 11/2014  . C. difficile diarrhea   . CHF (congestive heart failure) ([place])   . Colitis   . Congestive heart failure ([place]) 02/24/2015   S/p pacemaker  . Depression   . Elevated sed rate 08/15/2017  . Epigastric pain 03/22/2017  . GERD (gastroesophageal reflux disease)   . Hyperlipidemia   . Hypertension   . 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 Low back pain 07/14/2016  . 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 Multiple myeloma ([place]) 07/20/2018  . Multiple myeloma not having achieved remission ([place]) 07/20/2018  . Myalgia 12/31/2016  . Obstructive sleep apnea 03/31/2015  . SOB (shortness of breath) 04/09/2016  . Stroke ([place])    TIAs  . UTI (urinary tract infection)     Past Surgical History:  Procedure Laterality Date  . ABDOMINAL HYSTERECTOMY     menorraghia, 2006, total  . CHOLECYSTECTOMY    . COLONOSCOPY  2018   [facility] per patient  . ESOPHAGOGASTRODUODENOSCOPY  2018   [facility]   . KNEE SURGERY     right, repair torn torn cartialage  . PACEMAKER INSERTION  08/2014  . TONSILLECTOMY    . TUBAL LIGATION      Family History  Problem Relation Age of Onset  . Diabetes Mother   . Hypertension Mother   . Heart disease Mother        s/p 1 stent  . Hyperlipidemia Mother   . Arthritis Mother   . Cancer Father        COLON  . Colon cancer Father 64  . Irritable bowel syndrome Sister   . Hyperlipidemia Daughter   . Hypertension Daughter   . Hypertension Maternal Grandmother   . Arthritis Maternal Grandmother   . Heart disease Maternal Grandfather        MI  . Hypertension Maternal Grandfather   . Arthritis Maternal Grandfather   . Hypertension Son   . Esophageal cancer Neg Hx     Social History   Socioeconomic History  . Marital status: Divorced    Spouse name: [name]  . Number of children: 3  . Years of education: [name]  . Highest education level: [name]  Occupational History  . [name]  Social Needs  . Financial resource strain: [name]  . Food insecurity:    Worry:  [name]    Inability: [name]  . Transportation needs:    Medical: [name]    Non-medical: [name]  Tobacco Use  . Smoking status: Never Smoker  . Smokeless tobacco: Never Used  Substance and Sexual Activity  . Alcohol use: No    Alcohol/week: 0.0 standard drinks  . Drug use: No  . Sexual activity: [name]  Lifestyle  . Physical activity:    Days per week: [name]    Minutes per session: [name]  . Stress: [name]  Relationships  . Social connections:    Talks on phone: [name]    Gets together: [name]    Attends religious service: [name]    Active member of club or organization: [name]    Attends meetings of clubs or organizations: [name]    Relationship status: [name]  . Intimate partner  violence:    Fear of current or ex partner: [name]    Emotionally abused: [name]    Physically abused: [name]    Forced sexual activity: [name]  Other Topics Concern  . [name]  Social History Narrative  . [name]    Outpatient Medications Prior to Visit  Medication Sig Dispense Refill  . acetaminophen (TYLENOL) 325 MG tablet Take 650 mg by mouth.    Marland Kitchen albuterol (PROVENTIL HFA;VENTOLIN HFA) 108 (90 Base) MCG/ACT inhaler Inhale 2 puffs into the lungs every 6 (six) hours as needed for wheezing or shortness of breath. 1 Inhaler 2  . amLODipine (NORVASC) 10 MG tablet Take 1 tablet (10 mg total) by mouth daily. 90 tablet 1  . aspirin EC 81 MG tablet Take 1 tablet (81 mg total) by mouth daily. 90 tablet 1  . carvedilol (COREG) 25 MG tablet Take 1 tablet (25 mg total) by mouth 2 (two) times daily with a meal. 180 tablet 1  . cetirizine (ZYRTEC) 10 MG tablet Take 1 tablet (10 mg total) by mouth every morning. 30 tablet 1  . conjugated estrogens (PREMARIN) vaginal cream Place vaginally.    . cyclobenzaprine (FLEXERIL) 10 MG tablet Take 10 mg by mouth as needed.     Marland Kitchen dexamethasone (DECADRON) 4 MG tablet Take 5 pills at one time with food ONCE a WEEK!! 100 tablet 2  . dicyclomine (BENTYL) 20 MG tablet Take 1 tablet (20 mg total) by mouth 3 (three) times daily as needed for spasms. 90 tablet 1  . erythromycin ophthalmic ointment Place a 1/2 inch ribbon of ointment into the lower eyelid. (Patient not taking: Reported on 01/24/2019) 1 g 0  . famciclovir (FAMVIR) 250 MG tablet Take 1 tablet (250 mg total) by mouth daily. 30 tablet 12  . famotidine (PEPCID) 20 MG tablet Take 1 tablet (20 mg total) by mouth 2 (two) times daily. 60 tablet 1  . fluticasone (FLONASE) 50 MCG/ACT nasal spray Place 2 sprays into both nostrils daily. 16 g 1  . HYDROcodone-acetaminophen (NORCO/VICODIN) 5-325 MG tablet Take 1 tablet by mouth every 4 (four) hours as needed for moderate pain or severe  pain. 20 tablet 0  . hyoscyamine (LEVSIN SL) 0.125 MG SL tablet Place 1 tablet (0.125 mg total) under the tongue every 4 (four) hours as needed. 30 tablet 2  . levocetirizine (XYZAL) 5 MG tablet Take 1 tablet (5 mg total) by mouth every evening.  30 tablet 2  . lidocaine (LIDODERM) 5 % Place 1 patch onto the skin daily. Remove & Discard patch within 12 hours or as directed by MD 30 patch 2  . methylPREDNISolone (MEDROL) 4 MG tablet 5 tab po qd X 1d then 4 tab po qd X 1d then 3 tab po qd X 1d then 2 tab po qd then 1 tab po qd 15 tablet 0  . Multiple Vitamins-Minerals (MULTIVITAMIN ADULT PO) Take by mouth.    . mupirocin ointment (BACTROBAN) 2 % Apply 1 application topically 2 (two) times daily. 22 g 0  . ondansetron (ZOFRAN) 4 MG tablet Take 1 tablet (4 mg total) by mouth every 8 (eight) hours as needed for nausea or vomiting. 30 tablet 1  . pantoprazole (PROTONIX) 40 MG tablet Take 1 tablet (40 mg total) by mouth 2 (two) times daily. 180 tablet 1  . pomalidomide (POMALYST) 3 MG capsule Take one capsule daily-Days #1-21. Repeat every 28 days. [id number] 21 capsule 0  . potassium chloride SA (KLOR-CON M20) 20 MEQ tablet Take 1 tablet (20 mEq total) by mouth daily. (Patient not taking: Reported on 02/24/2019) 90 tablet 1  . prochlorperazine (COMPAZINE) 10 MG tablet Take 1 tablet (10 mg total) by mouth every 6 (six) hours as needed (Nausea or vomiting). 30 tablet 1  . sucralfate (CARAFATE) 1 g tablet Take 1 tablet (1 g total) by mouth 4 (four) times daily -  with meals and at bedtime. 120 tablet 0  . topiramate (TOPAMAX) 25 MG tablet Take 1 tablet (25 mg total) by mouth 2 (two) times daily. 180 tablet 0  . triamterene-hydrochlorothiazide (MAXZIDE-25) 37.5-25 MG tablet Take 1 tablet by mouth daily. 30 tablet 4  . trimethoprim-polymyxin b (POLYTRIM) ophthalmic solution Place 1 drop into both eyes 3 (three) times daily as needed. 10 mL 0   No facility-administered medications prior to visit.      Allergies  Allergen Reactions  . Benazepril Anaphylaxis and Swelling    angioedema Throat and lip swelling  . Ondansetron Hcl Hives    Redness and hives post IV admin on 07/05/17  . Codeine Nausea And Vomiting  . Morphine Hives    Redness and hives noted post IV admin on 07/05/17    Review of Systems  Constitutional: Positive for malaise/fatigue.  HENT: Negative for sinus pain.   Respiratory: Negative for cough.   Cardiovascular: Negative for chest pain.  Gastrointestinal: Negative for abdominal pain.  Genitourinary: Negative for frequency.  Musculoskeletal: Negative for falls.  Skin: Positive for itching and rash.  Neurological: Negative for headaches.       Objective:    Physical Exam Constitutional:      Appearance: Normal appearance.  HENT:     Head: Normocephalic and atraumatic.     Nose: Nose normal.  Pulmonary:     Effort: Pulmonary effort is normal.  Neurological:     Mental Status: She is alert and oriented to person, place, and time.  Psychiatric:        Mood and Affect: Mood normal.        Behavior: Behavior normal.     There were no vitals taken for this visit. Wt Readings from Last 3 Encounters:  02/24/19 186 lb 8 oz (84.6 kg)  02/17/19 186 lb (84.4 kg)  02/03/19 177 lb 12 oz (80.6 kg)    Diabetic Foot Exam - Simple   No data filed     Lab Results  Component Value Date   WBC 3.6 (  L) 02/24/2019   HGB 11.1 (L) 02/24/2019   HCT 35.9 (L) 02/24/2019   PLT 104 (L) 02/24/2019   GLUCOSE 102 (H) 02/24/2019   CHOL 137 02/15/2018   TRIG 100.0 02/15/2018   HDL 45.90 02/15/2018   LDLCALC 72 02/15/2018   ALT 12 02/24/2019   AST 16 02/24/2019   NA 140 02/24/2019   K 4.1 02/24/2019   CL 106 02/24/2019   CREATININE 0.76 02/24/2019   BUN 25 (H) 02/24/2019   CO2 27 02/24/2019   TSH 1.79 02/15/2018   INR 0.90 07/28/2018    Lab Results  Component Value Date   TSH 1.79 02/15/2018   Lab Results  Component Value Date   WBC 3.6 (L) 02/24/2019    HGB 11.1 (L) 02/24/2019   HCT 35.9 (L) 02/24/2019   MCV 92.8 02/24/2019   PLT 104 (L) 02/24/2019   Lab Results  Component Value Date   NA 140 02/24/2019   K 4.1 02/24/2019   CHLORIDE 108 12/31/2016   CO2 27 02/24/2019   GLUCOSE 102 (H) 02/24/2019   BUN 25 (H) 02/24/2019   CREATININE 0.76 02/24/2019   BILITOT 0.5 02/24/2019   ALKPHOS 45 02/24/2019   AST 16 02/24/2019   ALT 12 02/24/2019   PROT 7.8 02/24/2019   ALBUMIN 3.8 02/24/2019   CALCIUM 9.8 02/24/2019   ANIONGAP 7 02/24/2019   EGFR >90 12/31/2016   GFR 93.98 12/02/2018   Lab Results  Component Value Date   CHOL 137 02/15/2018   Lab Results  Component Value Date   HDL 45.90 02/15/2018   Lab Results  Component Value Date   LDLCALC 72 02/15/2018   Lab Results  Component Value Date   TRIG 100.0 02/15/2018   Lab Results  Component Value Date   CHOLHDL 3 02/15/2018   No results found for: HGBA1C     Assessment & Plan:   Problem List Items Addressed This Visit    Urticaria    She continues to have rash but she believes it is helping. No change in meds, call if worsens.          I am having Aubria Vanecek maintain her conjugated estrogens, acetaminophen, carvedilol, pantoprazole, aspirin EC, dicyclomine, ondansetron, lidocaine, topiramate, levocetirizine, sucralfate, famciclovir, hyoscyamine, prochlorperazine, dexamethasone, amLODipine, fluticasone, albuterol, cyclobenzaprine, potassium chloride SA, trimethoprim-polymyxin b, mupirocin ointment, Multiple Vitamins-Minerals (MULTIVITAMIN ADULT PO), erythromycin, HYDROcodone-acetaminophen, triamterene-hydrochlorothiazide, pomalidomide, methylPREDNISolone, famotidine, and cetirizine.  No orders of the defined types were placed in this encounter.  I discussed the assessment and treatment plan with the patient. The patient was provided an opportunity to ask questions and all were answered. The patient agreed with the plan and demonstrated an understanding of the  instructions.   The patient was advised to call back or seek an in-person evaluation if the symptoms worsen or if the condition fails to improve as anticipated.  I provided 7 minutes of non-face-to-face time during this encounter.   Penni Homans, MD

## 2019-02-28 ENCOUNTER — Other Ambulatory Visit: Payer: Self-pay | Admitting: *Deleted

## 2019-02-28 DIAGNOSIS — C9000 Multiple myeloma not having achieved remission: Secondary | ICD-10-CM

## 2019-02-28 MED ORDER — POMALIDOMIDE 3 MG PO CAPS: ORAL_CAPSULE | ORAL | 0 refills | Status: DC

## 2019-03-03 ENCOUNTER — Inpatient Hospital Stay: Payer: Medicare Other

## 2019-03-03 ENCOUNTER — Other Ambulatory Visit: Payer: Self-pay

## 2019-03-03 ENCOUNTER — Other Ambulatory Visit: Payer: Self-pay | Admitting: Oncology

## 2019-03-03 VITALS — BP 156/77 | HR 74 | Temp 97.4°F | Resp 18

## 2019-03-03 DIAGNOSIS — C9000 Multiple myeloma not having achieved remission: Secondary | ICD-10-CM

## 2019-03-03 DIAGNOSIS — Z5112 Encounter for antineoplastic immunotherapy: Secondary | ICD-10-CM | POA: Diagnosis not present

## 2019-03-03 LAB — CMP (CANCER CENTER ONLY)
ALT: 14 U/L (ref 0–44)
AST: 17 U/L (ref 15–41)
Albumin: 3.7 g/dL (ref 3.5–5.0)
Alkaline Phosphatase: 45 U/L (ref 38–126)
Anion gap: 6 (ref 5–15)
BUN: 19 mg/dL (ref 8–23)
CO2: 28 mmol/L (ref 22–32)
Calcium: 9.7 mg/dL (ref 8.9–10.3)
Chloride: 108 mmol/L (ref 98–111)
Creatinine: 0.73 mg/dL (ref 0.44–1.00)
GFR, Est AFR Am: >60 mL/min (ref >60)
GFR, Estimated: >60 mL/min (ref >60)
Glucose, Bld: 64 mg/dL — ABNORMAL LOW (ref 70–99)
Potassium: 3.6 mmol/L (ref 3.5–5.1)
Sodium: 142 mmol/L (ref 135–145)
Total Bilirubin: 0.5 mg/dL (ref 0.3–1.2)
Total Protein: 7.5 g/dL (ref 6.5–8.1)

## 2019-03-03 LAB — CBC WITH DIFFERENTIAL (CANCER CENTER ONLY)
Abs Immature Granulocytes: 0.01 10*3/uL (ref 0.00–0.07)
Basophils Absolute: 0 10*3/uL (ref 0.0–0.1)
Basophils Relative: 1 %
Eosinophils Absolute: 0.1 10*3/uL (ref 0.0–0.5)
Eosinophils Relative: 6 %
HCT: 35.9 % — ABNORMAL LOW (ref 36.0–46.0)
Hemoglobin: 11.4 g/dL — ABNORMAL LOW (ref 12.0–15.0)
Immature Granulocytes: 0 %
Lymphocytes Relative: 36 %
Lymphs Abs: 0.8 10*3/uL (ref 0.7–4.0)
MCH: 29.2 pg (ref 26.0–34.0)
MCHC: 31.8 g/dL (ref 30.0–36.0)
MCV: 92.1 fL (ref 80.0–100.0)
Monocytes Absolute: 0.4 10*3/uL (ref 0.1–1.0)
Monocytes Relative: 18 %
Neutro Abs: 0.9 10*3/uL — ABNORMAL LOW (ref 1.7–7.7)
Neutrophils Relative %: 39 %
Platelet Count: 112 10*3/uL — ABNORMAL LOW (ref 150–400)
RBC: 3.9 MIL/uL (ref 3.87–5.11)
RDW: 14.9 % (ref 11.5–15.5)
WBC Count: 2.3 10*3/uL — ABNORMAL LOW (ref 4.0–10.5)
nRBC: 0 % (ref 0.0–0.2)

## 2019-03-03 LAB — LACTATE DEHYDROGENASE: LDH: 176 U/L (ref 98–192)

## 2019-03-03 MED ORDER — PROCHLORPERAZINE MALEATE 10 MG PO TABS: 10.0000 mg | ORAL_TABLET | Freq: Once | ORAL | Status: DC

## 2019-03-03 MED ORDER — PROCHLORPERAZINE MALEATE 10 MG PO TABS: 10.0000 mg | ORAL_TABLET | Freq: Four times a day (QID) | ORAL | 1 refills | Status: DC | PRN

## 2019-03-03 MED ORDER — BORTEZOMIB CHEMO SQ INJECTION 3.5 MG (2.5MG/ML)
1.3000 mg/m2 | Freq: Once | INTRAMUSCULAR | Status: AC
  Administered 2019-03-03: 2.5 mg via SUBCUTANEOUS
  Filled 2019-03-03: qty 1

## 2019-03-03 NOTE — Progress Notes (Signed)
Ok to treat with ANC 0.9 per Dr. Ennever.  

## 2019-03-03 NOTE — Patient Instructions (Addendum)
[facility] Discharge Instructions for Patients Receiving Chemotherapy  Today you received the following chemotherapy agents: Velcade.  To help prevent nausea and vomiting after your treatment, we encourage you to take your nausea medication as prescribed.   If you develop nausea and vomiting that is not controlled by your nausea medication, call the clinic.   BELOW ARE SYMPTOMS THAT SHOULD BE REPORTED IMMEDIATELY:  *FEVER GREATER THAN 100.5 F  *CHILLS WITH OR WITHOUT FEVER  NAUSEA AND VOMITING THAT IS NOT CONTROLLED WITH YOUR NAUSEA MEDICATION  *UNUSUAL SHORTNESS OF BREATH  *UNUSUAL BRUISING OR BLEEDING  TENDERNESS IN MOUTH AND THROAT WITH OR WITHOUT PRESENCE OF ULCERS  *URINARY PROBLEMS  *BOWEL PROBLEMS  UNUSUAL RASH Items with * indicate a potential emergency and should be followed up as soon as possible.  Feel free to call the clinic should you have any questions or concerns. The clinic phone number is [phone].  Please show the CHEMO ALERT CARD at check-in to the Emergency Department and triage nurse.  Bortezomib injection What is this medicine? BORTEZOMIB (bor TEZ oh mib) is a medicine that targets proteins in cancer cells and stops the cancer cells from growing. It is used to treat multiple myeloma and mantle-cell lymphoma. This medicine may be used for other purposes; ask your health care provider or pharmacist if you have questions. COMMON BRAND NAME(S): Velcade What should I tell my health care provider before I take this medicine? They need to know if you have any of these conditions: -diabetes -heart disease -irregular heartbeat -liver disease -on hemodialysis -low blood counts, like low white blood cells, platelets, or hemoglobin -peripheral neuropathy -taking medicine for blood pressure -an unusual or allergic reaction to bortezomib, mannitol, boron, other medicines, foods, dyes, or preservatives -pregnant or trying to get  pregnant -breast-feeding How should I use this medicine? This medicine is for injection into a vein or for injection under the skin. It is given by a health care professional in a hospital or clinic setting. Talk to your pediatrician regarding the use of this medicine in children. Special care may be needed. Overdosage: If you think you have taken too much of this medicine contact a poison control center or emergency room at once. NOTE: This medicine is only for you. Do not share this medicine with others. What if I miss a dose? It is important not to miss your dose. Call your doctor or health care professional if you are unable to keep an appointment. What may interact with this medicine? This medicine may interact with the following medications: -ketoconazole -rifampin -ritonavir -St. John's Wort This list may not describe all possible interactions. Give your health care provider a list of all the medicines, herbs, non-prescription drugs, or dietary supplements you use. Also tell them if you smoke, drink alcohol, or use illegal drugs. Some items may interact with your medicine. What should I watch for while using this medicine? You may get drowsy or dizzy. Do not drive, use machinery, or do anything that needs mental alertness until you know how this medicine affects you. Do not stand or sit up quickly, especially if you are an older patient. This reduces the risk of dizzy or fainting spells. In some cases, you may be given additional medicines to help with side effects. Follow all directions for their use. Call your doctor or health care professional for advice if you get a fever, chills or sore throat, or other symptoms of a cold or flu. Do not treat   flu. Do not treat yourself. This drug decreases your body's ability to fight infections. Try to avoid being around people who are sick. This medicine may increase your risk to bruise or bleed. Call your doctor or health care professional if you notice any unusual  bleeding. You may need blood work done while you are taking this medicine. In some patients, this medicine may cause a serious brain infection that may cause death. If you have any problems seeing, thinking, speaking, walking, or standing, tell your doctor right away. If you cannot reach your doctor, urgently seek other source of medical care. Check with your doctor or health care professional if you get an attack of severe diarrhea, nausea and vomiting, or if you sweat a lot. The loss of too much body fluid can make it dangerous for you to take this medicine. Do not become pregnant while taking this medicine or for at least 7 months after stopping it. Women should inform their doctor if they wish to become pregnant or think they might be pregnant. Men should not father a child while taking this medicine and for at least 4 months after stopping it. There is a potential for serious side effects to an unborn child. Talk to your health care professional or pharmacist for more information. Do not breast-feed an infant while taking this medicine or for 2 months after stopping it. This medicine may interfere with the ability to have a child. You should talk with your doctor or health care professional if you are concerned about your fertility. What side effects may I notice from receiving this medicine? Side effects that you should report to your doctor or health care professional as soon as possible: -allergic reactions like skin rash, itching or hives, swelling of the face, lips, or tongue -breathing problems -changes in hearing -changes in vision -fast, irregular heartbeat -feeling faint or lightheaded, falls -pain, tingling, numbness in the hands or feet -right upper belly pain -seizures -swelling of the ankles, feet, hands -unusual bleeding or bruising -unusually weak or tired -vomiting -yellowing of the eyes or skin Side effects that usually do not require medical attention (report to your  doctor or health care professional if they continue or are bothersome): -changes in emotions or moods -constipation -diarrhea -loss of appetite -headache -irritation at site where injected -nausea This list may not describe all possible side effects. Call your doctor for medical advice about side effects. You may report side effects to FDA at 1-800-FDA-1088. Where should I keep my medicine? This drug is given in a hospital or clinic and will not be stored at home. NOTE: This sheet is a summary. It may not cover all possible information. If you have questions about this medicine, talk to your doctor, pharmacist, or health care provider.  2019 Elsevier/Gold Standard (2018-03-07 16:29:31)  

## 2019-03-07 DIAGNOSIS — R1013 Epigastric pain: Secondary | ICD-10-CM | POA: Diagnosis not present

## 2019-03-15 DIAGNOSIS — R7303 Prediabetes: Secondary | ICD-10-CM | POA: Diagnosis not present

## 2019-03-15 DIAGNOSIS — E559 Vitamin D deficiency, unspecified: Secondary | ICD-10-CM | POA: Diagnosis not present

## 2019-03-15 DIAGNOSIS — I10 Essential (primary) hypertension: Secondary | ICD-10-CM | POA: Diagnosis not present

## 2019-03-15 DIAGNOSIS — E7800 Pure hypercholesterolemia, unspecified: Secondary | ICD-10-CM | POA: Diagnosis not present

## 2019-03-15 DIAGNOSIS — Z95810 Presence of automatic (implantable) cardiac defibrillator: Secondary | ICD-10-CM | POA: Diagnosis not present

## 2019-03-15 DIAGNOSIS — Z79899 Other long term (current) drug therapy: Secondary | ICD-10-CM | POA: Diagnosis not present

## 2019-03-15 DIAGNOSIS — Z780 Asymptomatic menopausal state: Secondary | ICD-10-CM | POA: Diagnosis not present

## 2019-03-15 DIAGNOSIS — Z0000 Encounter for general adult medical examination without abnormal findings: Secondary | ICD-10-CM | POA: Diagnosis not present

## 2019-03-15 DIAGNOSIS — R5383 Other fatigue: Secondary | ICD-10-CM | POA: Diagnosis not present

## 2019-03-17 ENCOUNTER — Other Ambulatory Visit: Payer: Self-pay

## 2019-03-17 ENCOUNTER — Inpatient Hospital Stay: Payer: Medicare Other | Attending: Hematology & Oncology

## 2019-03-17 ENCOUNTER — Inpatient Hospital Stay: Payer: Medicare Other

## 2019-03-17 ENCOUNTER — Ambulatory Visit: Payer: Self-pay | Admitting: Family

## 2019-03-17 VITALS — BP 152/89 | HR 69 | Temp 98.0°F | Resp 16

## 2019-03-17 DIAGNOSIS — C9000 Multiple myeloma not having achieved remission: Secondary | ICD-10-CM | POA: Insufficient documentation

## 2019-03-17 DIAGNOSIS — Z5112 Encounter for antineoplastic immunotherapy: Secondary | ICD-10-CM | POA: Insufficient documentation

## 2019-03-17 LAB — CMP (CANCER CENTER ONLY)
ALT: 13 U/L (ref 0–44)
AST: 18 U/L (ref 15–41)
Albumin: 3.8 g/dL (ref 3.5–5.0)
Alkaline Phosphatase: 44 U/L (ref 38–126)
Anion gap: 7 (ref 5–15)
BUN: 15 mg/dL (ref 8–23)
CO2: 27 mmol/L (ref 22–32)
Calcium: 9.8 mg/dL (ref 8.9–10.3)
Chloride: 106 mmol/L (ref 98–111)
Creatinine: 0.69 mg/dL (ref 0.44–1.00)
GFR, Est AFR Am: >60 mL/min (ref >60)
GFR, Estimated: >60 mL/min (ref >60)
Glucose, Bld: 93 mg/dL (ref 70–99)
Potassium: 3.8 mmol/L (ref 3.5–5.1)
Sodium: 140 mmol/L (ref 135–145)
Total Bilirubin: 0.5 mg/dL (ref 0.3–1.2)
Total Protein: 7.8 g/dL (ref 6.5–8.1)

## 2019-03-17 LAB — CBC WITH DIFFERENTIAL (CANCER CENTER ONLY)
Abs Immature Granulocytes: 0 10*3/uL (ref 0.00–0.07)
Basophils Absolute: 0.1 10*3/uL (ref 0.0–0.1)
Basophils Relative: 4 %
Eosinophils Absolute: 0.1 10*3/uL (ref 0.0–0.5)
Eosinophils Relative: 4 %
HCT: 36.5 % (ref 36.0–46.0)
Hemoglobin: 11.6 g/dL — ABNORMAL LOW (ref 12.0–15.0)
Immature Granulocytes: 0 %
Lymphocytes Relative: 37 %
Lymphs Abs: 0.8 10*3/uL (ref 0.7–4.0)
MCH: 28.9 pg (ref 26.0–34.0)
MCHC: 31.8 g/dL (ref 30.0–36.0)
MCV: 91 fL (ref 80.0–100.0)
Monocytes Absolute: 0.3 10*3/uL (ref 0.1–1.0)
Monocytes Relative: 16 %
Neutro Abs: 0.8 10*3/uL — ABNORMAL LOW (ref 1.7–7.7)
Neutrophils Relative %: 39 %
Platelet Count: 175 10*3/uL (ref 150–400)
RBC: 4.01 MIL/uL (ref 3.87–5.11)
RDW: 15.2 % (ref 11.5–15.5)
WBC Count: 2 10*3/uL — ABNORMAL LOW (ref 4.0–10.5)
nRBC: 0 % (ref 0.0–0.2)

## 2019-03-17 MED ORDER — BORTEZOMIB CHEMO SQ INJECTION 3.5 MG (2.5MG/ML)
1.3000 mg/m2 | Freq: Once | INTRAMUSCULAR | Status: AC
  Administered 2019-03-17: 2.5 mg via SUBCUTANEOUS
  Filled 2019-03-17: qty 1

## 2019-03-17 MED ORDER — PROCHLORPERAZINE MALEATE 10 MG PO TABS: 10.0000 mg | ORAL_TABLET | Freq: Once | ORAL | Status: DC

## 2019-03-17 NOTE — Patient Instructions (Signed)

## 2019-03-17 NOTE — Progress Notes (Signed)
ok to treat with ANC of 0.8 per Dr Marin Olp. dph

## 2019-03-20 DIAGNOSIS — R030 Elevated blood-pressure reading, without diagnosis of hypertension: Secondary | ICD-10-CM | POA: Diagnosis not present

## 2019-03-20 DIAGNOSIS — M79604 Pain in right leg: Secondary | ICD-10-CM | POA: Diagnosis not present

## 2019-03-20 DIAGNOSIS — M810 Age-related osteoporosis without current pathological fracture: Secondary | ICD-10-CM | POA: Diagnosis not present

## 2019-03-20 DIAGNOSIS — M25511 Pain in right shoulder: Secondary | ICD-10-CM | POA: Diagnosis not present

## 2019-03-20 DIAGNOSIS — M79605 Pain in left leg: Secondary | ICD-10-CM | POA: Diagnosis not present

## 2019-03-23 DIAGNOSIS — J40 Bronchitis, not specified as acute or chronic: Secondary | ICD-10-CM | POA: Diagnosis not present

## 2019-03-24 ENCOUNTER — Inpatient Hospital Stay (HOSPITAL_BASED_OUTPATIENT_CLINIC_OR_DEPARTMENT_OTHER): Payer: Medicare Other | Admitting: Hematology & Oncology

## 2019-03-24 ENCOUNTER — Other Ambulatory Visit: Payer: Self-pay

## 2019-03-24 ENCOUNTER — Inpatient Hospital Stay: Payer: Medicare Other

## 2019-03-24 VITALS — BP 158/86 | HR 71 | Temp 97.4°F | Resp 16 | Wt 186.0 lb

## 2019-03-24 DIAGNOSIS — C9000 Multiple myeloma not having achieved remission: Secondary | ICD-10-CM | POA: Diagnosis not present

## 2019-03-24 DIAGNOSIS — Z5112 Encounter for antineoplastic immunotherapy: Secondary | ICD-10-CM | POA: Diagnosis not present

## 2019-03-24 LAB — CBC WITH DIFFERENTIAL (CANCER CENTER ONLY)
Abs Immature Granulocytes: 0.01 10*3/uL (ref 0.00–0.07)
Basophils Absolute: 0.1 10*3/uL (ref 0.0–0.1)
Basophils Relative: 2 %
Eosinophils Absolute: 0.2 10*3/uL (ref 0.0–0.5)
Eosinophils Relative: 8 %
HCT: 36.1 % (ref 36.0–46.0)
Hemoglobin: 11.5 g/dL — ABNORMAL LOW (ref 12.0–15.0)
Immature Granulocytes: 0 %
Lymphocytes Relative: 32 %
Lymphs Abs: 0.8 10*3/uL (ref 0.7–4.0)
MCH: 29 pg (ref 26.0–34.0)
MCHC: 31.9 g/dL (ref 30.0–36.0)
MCV: 90.9 fL (ref 80.0–100.0)
Monocytes Absolute: 0.1 10*3/uL (ref 0.1–1.0)
Monocytes Relative: 6 %
Neutro Abs: 1.3 10*3/uL — ABNORMAL LOW (ref 1.7–7.7)
Neutrophils Relative %: 52 %
Platelet Count: 138 10*3/uL — ABNORMAL LOW (ref 150–400)
RBC: 3.97 MIL/uL (ref 3.87–5.11)
RDW: 14.7 % (ref 11.5–15.5)
WBC Count: 2.5 10*3/uL — ABNORMAL LOW (ref 4.0–10.5)
nRBC: 0 % (ref 0.0–0.2)

## 2019-03-24 LAB — CMP (CANCER CENTER ONLY)
ALT: 12 U/L (ref 0–44)
AST: 16 U/L (ref 15–41)
Albumin: 4 g/dL (ref 3.5–5.0)
Alkaline Phosphatase: 49 U/L (ref 38–126)
Anion gap: 9 (ref 5–15)
BUN: 19 mg/dL (ref 8–23)
CO2: 26 mmol/L (ref 22–32)
Calcium: 9.3 mg/dL (ref 8.9–10.3)
Chloride: 105 mmol/L (ref 98–111)
Creatinine: 0.72 mg/dL (ref 0.44–1.00)
GFR, Est AFR Am: >60 mL/min (ref >60)
GFR, Estimated: >60 mL/min (ref >60)
Glucose, Bld: 71 mg/dL (ref 70–99)
Potassium: 3.3 mmol/L — ABNORMAL LOW (ref 3.5–5.1)
Sodium: 140 mmol/L (ref 135–145)
Total Bilirubin: 0.5 mg/dL (ref 0.3–1.2)
Total Protein: 8.1 g/dL (ref 6.5–8.1)

## 2019-03-24 MED ORDER — PROCHLORPERAZINE MALEATE 10 MG PO TABS: 10.0000 mg | ORAL_TABLET | Freq: Once | ORAL | Status: DC

## 2019-03-24 MED ORDER — BORTEZOMIB CHEMO SQ INJECTION 3.5 MG (2.5MG/ML)
1.3000 mg/m2 | Freq: Once | INTRAMUSCULAR | Status: AC
  Administered 2019-03-24: 2.5 mg via SUBCUTANEOUS
  Filled 2019-03-24: qty 1

## 2019-03-24 NOTE — Patient Instructions (Signed)

## 2019-03-24 NOTE — Progress Notes (Signed)
Hematology and Oncology Follow Up Visit  Tami [name] [id number] 1955/12/22 63 y.o. 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  So far, she is doing pretty well.  She is having no problems with the pomalidomide.  In particular, there is no diarrhea.   She is having no problems with cough.  There is no fever.  She has had no bleeding.  She has had no leg swelling.  Her last myeloma studies done on 02/17/2019 showed an M spike of 1.3 g/dL.  Her IgG level was 2700 mg/dL.  Her kappa light chain was 4.8 mg/dL.  I thought maybe we would have to make a change in her Velcade.  However, I am willing to wait to see if the pomalidomide is going to help her.  Overall, her performance status is ECOG 1.  Medications:  Allergies as of 03/24/2019      Reactions   Benazepril Anaphylaxis, Swelling   angioedema Throat and lip swelling   Ondansetron Hcl Hives   Redness and hives post IV admin on 07/05/17   Codeine Nausea And Vomiting   Morphine Hives   Redness and hives noted post IV admin on 07/05/17      Medication List       Accurate as of Mar 24, 2019 10:46 AM. If you have any questions, ask your nurse or doctor.        acetaminophen 325 MG tablet Commonly known as:  TYLENOL Take 650 mg by mouth.   albuterol 108 (90 Base) MCG/ACT inhaler Commonly known as:  VENTOLIN HFA Inhale 2 puffs into the lungs every 6 (six) hours as needed for wheezing or shortness of breath.   alendronate 70 MG tablet Commonly known as:  FOSAMAX Take 70 mg by mouth daily.   amLODipine 10 MG tablet Commonly known as:  NORVASC Take 1 tablet (10 mg total) by mouth daily.   aspirin EC 81 MG tablet Take 1 tablet (81 mg total) by mouth daily.   carvedilol 25  MG tablet Commonly known as:  COREG Take 1 tablet (25 mg total) by mouth 2 (two) times daily with a meal.   cetirizine 10 MG tablet Commonly known as:  ZYRTEC Take 1 tablet (10 mg total) by mouth every morning.   cyclobenzaprine 10 MG tablet Commonly known as:  FLEXERIL Take 10 mg by mouth as needed.   cyclobenzaprine 5 MG tablet Commonly known as:  FLEXERIL Take 5 mg by mouth.   dexamethasone 4 MG tablet Commonly known as:  DECADRON Take 5 pills at one time with food ONCE a WEEK!!   dicyclomine 20 MG tablet Commonly known as:  Bentyl Take 1 tablet (20 mg total) by mouth 3 (three) times daily as needed for spasms.   erythromycin ophthalmic ointment Place a 1/2 inch ribbon of ointment into the lower eyelid.   famciclovir 250 MG tablet Commonly known as:  FAMVIR Take 1 tablet (250 mg total) by mouth daily.   famotidine 20 MG tablet Commonly known as:  Pepcid Take 1 tablet (20 mg total) by mouth 2 (two) times daily.   fluticasone 50 MCG/ACT nasal spray Commonly known as:  FLONASE Place 2 sprays into both nostrils daily.   HYDROcodone-acetaminophen 5-325 MG tablet Commonly known  as:  NORCO/VICODIN Take 1 tablet by mouth every 4 (four) hours as needed for moderate pain or severe pain.   hyoscyamine 0.125 MG SL tablet Commonly known as:  LEVSIN SL Place 1 tablet (0.125 mg total) under the tongue every 4 (four) hours as needed.   levocetirizine 5 MG tablet Commonly known as:  XYZAL Take 1 tablet (5 mg total) by mouth every evening.   lidocaine 5 % Commonly known as:  Lidoderm Place 1 patch onto the skin daily. Remove & Discard patch within 12 hours or as directed by MD   methylPREDNISolone 4 MG tablet Commonly known as:  Medrol 5 tab po qd X 1d then 4 tab po qd X 1d then 3 tab po qd X 1d then 2 tab po qd then 1 tab po qd   MULTIVITAMIN ADULT PO Take by mouth.   mupirocin ointment 2 % Commonly known as:  Bactroban Apply 1 application topically 2 (two) times  daily.   ondansetron 4 MG tablet Commonly known as:  Zofran Take 1 tablet (4 mg total) by mouth every 8 (eight) hours as needed for nausea or vomiting.   pantoprazole 40 MG tablet Commonly known as:  PROTONIX Take 1 tablet (40 mg total) by mouth 2 (two) times daily.   pomalidomide 3 MG capsule Commonly known as:  POMALYST Take one capsule daily-Days #1-21. Repeat every 28 days. LEXN#[id number]   potassium chloride SA 20 MEQ tablet Commonly known as:  Klor-Con M20 Take 1 tablet (20 mEq total) by mouth daily.   potassium chloride SA 20 MEQ tablet Commonly known as:  K-DUR Take 2 tablets (40 mEq total) by mouth once for 1 dose.   Premarin vaginal cream Generic drug:  conjugated estrogens Place vaginally.   prochlorperazine 10 MG tablet Commonly known as:  COMPAZINE Take 1 tablet (10 mg total) by mouth every 6 (six) hours as needed (Nausea or vomiting).   ranitidine 15 MG/ML syrup Commonly known as:  ZANTAC Take by mouth.   sucralfate 1 g tablet Commonly known as:  Carafate Take 1 tablet (1 g total) by mouth 4 (four) times daily -  with meals and at bedtime.   topiramate 25 MG tablet Commonly known as:  Topamax Take 1 tablet (25 mg total) by mouth 2 (two) times daily.   triamterene-hydrochlorothiazide 37.5-25 MG tablet Commonly known as:  Maxzide-25 Take 1 tablet by mouth daily.   trimethoprim-polymyxin b ophthalmic solution Commonly known as:  Polytrim Place 1 drop into both eyes 3 (three) times daily as needed.       Allergies:  Allergies  Allergen Reactions  . Benazepril Anaphylaxis and Swelling    angioedema Throat and lip swelling  . Ondansetron Hcl Hives    Redness and hives post IV admin on 07/05/17  . Codeine Nausea And Vomiting  . Morphine Hives    Redness and hives noted post IV admin on 07/05/17    Past Medical History, Surgical history, Social history, and Family History were reviewed and updated.  Review of Systems: Review of Systems   Constitutional: Negative.   HENT: Negative.   Eyes: Negative.   Respiratory: Negative.   Cardiovascular: Negative.   Gastrointestinal: Negative.   Genitourinary: Negative.   Musculoskeletal: Negative.   Skin: Positive for rash.  Neurological: Negative.   Endo/Heme/Allergies: Negative.   Psychiatric/Behavioral: Negative.      Physical Exam:  weight is 186 lb (84.4 kg). Her temperature is 97.4 F (36.3 C) (abnormal). Her blood pressure is 158/86 (abnormal)  and her pulse is 71. Her respiration is 16 and oxygen saturation is 99%.   Wt Readings from Last 3 Encounters:  03/24/19 186 lb (84.4 kg)  03/24/19 186 lb (84.4 kg)  02/24/19 186 lb 8 oz (84.6 kg)    Physical Exam Vitals signs reviewed.  HENT:     Head: Normocephalic and atraumatic.  Eyes:     Pupils: Pupils are equal, round, and reactive to light.  Neck:     Musculoskeletal: Normal range of motion.  Cardiovascular:     Rate and Rhythm: Normal rate and regular rhythm.     Heart sounds: Normal heart sounds.  Pulmonary:     Effort: Pulmonary effort is normal.     Breath sounds: Normal breath sounds.  Abdominal:     General: Bowel sounds are normal.     Palpations: Abdomen is soft.  Musculoskeletal: Normal range of motion.        General: No tenderness or deformity.  Lymphadenopathy:     Cervical: No cervical adenopathy.  Skin:    General: Skin is warm and dry.     Findings: No erythema or rash.  Neurological:     Mental Status: She is alert and oriented to person, place, and time.  Psychiatric:        Behavior: Behavior normal.        Thought Content: Thought content normal.        Judgment: Judgment normal.      Lab Results  Component Value Date   WBC 2.5 (L) 03/24/2019   HGB 11.5 (L) 03/24/2019   HCT 36.1 03/24/2019   MCV 90.9 03/24/2019   PLT 138 (L) 03/24/2019   Lab Results  Component Value Date   FERRITIN 169 09/16/2016   IRON 59 09/16/2016   TIBC 281 09/16/2016   UIBC 222 09/16/2016    IRONPCTSAT 21 09/16/2016   Lab Results  Component Value Date   RETICCTPCT 0.8 07/16/2016   RBC 3.97 03/24/2019   RETICCTABS 32,640 07/16/2016   Lab Results  Component Value Date   KPAFRELGTCHN 48.4 (H) 02/17/2019   LAMBDASER 10.2 02/17/2019   KAPLAMBRATIO 4.75 (H) 02/17/2019   Lab Results  Component Value Date   IGGSERUM 2,714 (H) 02/17/2019   IGGSERUM 3,185 (H) 02/17/2019   IGA 256 02/17/2019   IGA 297 02/17/2019   IGMSERUM 112 02/17/2019   IGMSERUM 131 02/17/2019   Lab Results  Component Value Date   TOTALPROTELP 8.7 (H) 02/17/2019   ALBUMINELP 3.7 02/17/2019   A1GS 0.3 02/17/2019   A2GS 1.1 (H) 02/17/2019   BETS 1.2 02/17/2019   BETA2SER 0.5 07/16/2016   GAMS 2.4 (H) 02/17/2019   MSPIKE 1.3 (H) 02/17/2019   SPEI Comment 10/07/2018     Chemistry      Component Value Date/Time   NA 140 03/24/2019 0858   NA 143 11/08/2017 1127   NA 140 12/31/2016 1000   K 3.3 (L) 03/24/2019 0858   K 4.0 11/08/2017 1127   K 3.6 12/31/2016 1000   CL 105 03/24/2019 0858   CL 107 11/08/2017 1127   CO2 26 03/24/2019 0858   CO2 25 11/08/2017 1127   CO2 24 12/31/2016 1000   BUN 19 03/24/2019 0858   BUN 19 11/08/2017 1127   BUN 22.3 12/31/2016 1000   CREATININE 0.72 03/24/2019 0858   CREATININE 0.9 11/08/2017 1127   CREATININE 0.8 12/31/2016 1000      Component Value Date/Time   CALCIUM 9.3 03/24/2019 0858   CALCIUM 9.9  11/08/2017 1127   CALCIUM 9.5 12/31/2016 1000   ALKPHOS 49 03/24/2019 0858   ALKPHOS 44 11/08/2017 1127   ALKPHOS 51 12/31/2016 1000   AST 16 03/24/2019 0858   AST 28 12/31/2016 1000   ALT 12 03/24/2019 0858   ALT 36 11/08/2017 1127   ALT 16 12/31/2016 1000   BILITOT 0.5 03/24/2019 0858   BILITOT 0.46 12/31/2016 1000      Impression and Plan: Ms. Stankovich is a very pleasant 63 yo African American female with a previous IgG kappa MGUS with progression to myeloma.    I will wait until the end of May so we can see how she is doing with the pomalidomide.   Hopefully, this will work on getting her M spike down.    I will see her back in 4 weeks      [name], MD 5/15/202010:46 AM

## 2019-03-24 NOTE — Progress Notes (Signed)
Ok to treat with anc of 1.3 per Dr Marin Olp. dph

## 2019-03-25 ENCOUNTER — Other Ambulatory Visit: Payer: Self-pay | Admitting: Hematology & Oncology

## 2019-03-25 LAB — IGG, IGA, IGM
IgA: 232 mg/dL (ref 87–352)
IgG (Immunoglobin G), Serum: 2414 mg/dL — ABNORMAL HIGH (ref 586–1602)
IgM (Immunoglobulin M), Srm: 104 mg/dL (ref 26–217)

## 2019-03-27 LAB — KAPPA/LAMBDA LIGHT CHAINS
Kappa free light chain: 58.5 mg/L — ABNORMAL HIGH (ref 3.3–19.4)
Kappa, lambda light chain ratio: 5.42 — ABNORMAL HIGH (ref 0.26–1.65)
Lambda free light chains: 10.8 mg/L (ref 5.7–26.3)

## 2019-03-29 ENCOUNTER — Other Ambulatory Visit: Payer: Self-pay | Admitting: *Deleted

## 2019-03-29 DIAGNOSIS — C9000 Multiple myeloma not having achieved remission: Secondary | ICD-10-CM

## 2019-03-29 LAB — PROTEIN ELECTROPHORESIS, SERUM, WITH REFLEX
A/G Ratio: 0.8 (ref 0.7–1.7)
Albumin ELP: 3.5 g/dL (ref 2.9–4.4)
Alpha-1-Globulin: 0.2 g/dL (ref 0.0–0.4)
Alpha-2-Globulin: 1 g/dL (ref 0.4–1.0)
Beta Globulin: 1.1 g/dL (ref 0.7–1.3)
Gamma Globulin: 2.1 g/dL — ABNORMAL HIGH (ref 0.4–1.8)
Globulin, Total: 4.4 g/dL — ABNORMAL HIGH (ref 2.2–3.9)
M-Spike, %: 0.9 g/dL — ABNORMAL HIGH
SPEP Interpretation: 0
Total Protein ELP: 7.9 g/dL (ref 6.0–8.5)

## 2019-03-29 LAB — IMMUNOFIXATION REFLEX, SERUM
IgA: 252 mg/dL (ref 87–352)
IgG (Immunoglobin G), Serum: 2552 mg/dL — ABNORMAL HIGH (ref 586–1602)
IgM (Immunoglobulin M), Srm: 113 mg/dL (ref 26–217)

## 2019-03-29 MED ORDER — POMALIDOMIDE 3 MG PO CAPS: ORAL_CAPSULE | ORAL | 0 refills | Status: DC

## 2019-03-31 ENCOUNTER — Inpatient Hospital Stay: Payer: Medicare Other

## 2019-03-31 ENCOUNTER — Other Ambulatory Visit: Payer: Self-pay

## 2019-03-31 VITALS — BP 134/63 | HR 66 | Temp 98.0°F | Resp 17

## 2019-03-31 DIAGNOSIS — C9000 Multiple myeloma not having achieved remission: Secondary | ICD-10-CM

## 2019-03-31 DIAGNOSIS — Z5112 Encounter for antineoplastic immunotherapy: Secondary | ICD-10-CM | POA: Diagnosis not present

## 2019-03-31 LAB — CMP (CANCER CENTER ONLY)
ALT: 11 U/L (ref 0–44)
AST: 16 U/L (ref 15–41)
Albumin: 3.8 g/dL (ref 3.5–5.0)
Alkaline Phosphatase: 46 U/L (ref 38–126)
Anion gap: 8 (ref 5–15)
BUN: 22 mg/dL (ref 8–23)
CO2: 29 mmol/L (ref 22–32)
Calcium: 9.6 mg/dL (ref 8.9–10.3)
Chloride: 103 mmol/L (ref 98–111)
Creatinine: 0.87 mg/dL (ref 0.44–1.00)
GFR, Est AFR Am: >60 mL/min (ref >60)
GFR, Estimated: >60 mL/min (ref >60)
Glucose, Bld: 99 mg/dL (ref 70–99)
Potassium: 3.7 mmol/L (ref 3.5–5.1)
Sodium: 140 mmol/L (ref 135–145)
Total Bilirubin: 0.4 mg/dL (ref 0.3–1.2)
Total Protein: 7.5 g/dL (ref 6.5–8.1)

## 2019-03-31 LAB — CBC WITH DIFFERENTIAL (CANCER CENTER ONLY)
Abs Immature Granulocytes: 0.01 10*3/uL (ref 0.00–0.07)
Basophils Absolute: 0.1 10*3/uL (ref 0.0–0.1)
Basophils Relative: 2 %
Eosinophils Absolute: 0.3 10*3/uL (ref 0.0–0.5)
Eosinophils Relative: 11 %
HCT: 36.4 % (ref 36.0–46.0)
Hemoglobin: 11.5 g/dL — ABNORMAL LOW (ref 12.0–15.0)
Immature Granulocytes: 0 %
Lymphocytes Relative: 34 %
Lymphs Abs: 1 10*3/uL (ref 0.7–4.0)
MCH: 28.9 pg (ref 26.0–34.0)
MCHC: 31.6 g/dL (ref 30.0–36.0)
MCV: 91.5 fL (ref 80.0–100.0)
Monocytes Absolute: 0.5 10*3/uL (ref 0.1–1.0)
Monocytes Relative: 18 %
Neutro Abs: 1 10*3/uL — ABNORMAL LOW (ref 1.7–7.7)
Neutrophils Relative %: 35 %
Platelet Count: 106 10*3/uL — ABNORMAL LOW (ref 150–400)
RBC: 3.98 MIL/uL (ref 3.87–5.11)
RDW: 14.6 % (ref 11.5–15.5)
WBC Count: 2.7 10*3/uL — ABNORMAL LOW (ref 4.0–10.5)
nRBC: 0 % (ref 0.0–0.2)

## 2019-03-31 MED ORDER — PROCHLORPERAZINE MALEATE 10 MG PO TABS: 10.0000 mg | ORAL_TABLET | Freq: Once | ORAL | Status: DC

## 2019-03-31 MED ORDER — BORTEZOMIB CHEMO SQ INJECTION 3.5 MG (2.5MG/ML)
1.3000 mg/m2 | Freq: Once | INTRAMUSCULAR | Status: AC
  Administered 2019-03-31: 2.5 mg via SUBCUTANEOUS
  Filled 2019-03-31: qty 1

## 2019-03-31 NOTE — Patient Instructions (Signed)

## 2019-03-31 NOTE — Progress Notes (Signed)
Ok to treat with ANC of 1.0 per Dr Maylon Peppers. dph

## 2019-04-03 DIAGNOSIS — M25511 Pain in right shoulder: Secondary | ICD-10-CM | POA: Diagnosis not present

## 2019-04-07 ENCOUNTER — Inpatient Hospital Stay: Payer: Medicare Other

## 2019-04-07 ENCOUNTER — Other Ambulatory Visit: Payer: Self-pay

## 2019-04-07 VITALS — BP 150/78 | HR 79 | Temp 97.7°F | Resp 18

## 2019-04-07 DIAGNOSIS — C9000 Multiple myeloma not having achieved remission: Secondary | ICD-10-CM | POA: Diagnosis not present

## 2019-04-07 DIAGNOSIS — Z5112 Encounter for antineoplastic immunotherapy: Secondary | ICD-10-CM | POA: Diagnosis not present

## 2019-04-07 LAB — CBC WITH DIFFERENTIAL (CANCER CENTER ONLY)
Abs Immature Granulocytes: 0.01 10*3/uL (ref 0.00–0.07)
Basophils Absolute: 0 10*3/uL (ref 0.0–0.1)
Basophils Relative: 1 %
Eosinophils Absolute: 0.2 10*3/uL (ref 0.0–0.5)
Eosinophils Relative: 7 %
HCT: 37.2 % (ref 36.0–46.0)
Hemoglobin: 11.7 g/dL — ABNORMAL LOW (ref 12.0–15.0)
Immature Granulocytes: 0 %
Lymphocytes Relative: 31 %
Lymphs Abs: 0.9 10*3/uL (ref 0.7–4.0)
MCH: 28.7 pg (ref 26.0–34.0)
MCHC: 31.5 g/dL (ref 30.0–36.0)
MCV: 91.2 fL (ref 80.0–100.0)
Monocytes Absolute: 0.6 10*3/uL (ref 0.1–1.0)
Monocytes Relative: 20 %
Neutro Abs: 1.1 10*3/uL — ABNORMAL LOW (ref 1.7–7.7)
Neutrophils Relative %: 41 %
Platelet Count: 136 10*3/uL — ABNORMAL LOW (ref 150–400)
RBC: 4.08 MIL/uL (ref 3.87–5.11)
RDW: 14.6 % (ref 11.5–15.5)
WBC Count: 2.8 10*3/uL — ABNORMAL LOW (ref 4.0–10.5)
nRBC: 0 % (ref 0.0–0.2)

## 2019-04-07 LAB — CMP (CANCER CENTER ONLY)
ALT: 12 U/L (ref 0–44)
AST: 15 U/L (ref 15–41)
Albumin: 3.7 g/dL (ref 3.5–5.0)
Alkaline Phosphatase: 40 U/L (ref 38–126)
Anion gap: 7 (ref 5–15)
BUN: 18 mg/dL (ref 8–23)
CO2: 27 mmol/L (ref 22–32)
Calcium: 9.2 mg/dL (ref 8.9–10.3)
Chloride: 108 mmol/L (ref 98–111)
Creatinine: 0.73 mg/dL (ref 0.44–1.00)
GFR, Est AFR Am: >60 mL/min (ref >60)
GFR, Estimated: >60 mL/min (ref >60)
Glucose, Bld: 101 mg/dL — ABNORMAL HIGH (ref 70–99)
Potassium: 3.5 mmol/L (ref 3.5–5.1)
Sodium: 142 mmol/L (ref 135–145)
Total Bilirubin: 0.6 mg/dL (ref 0.3–1.2)
Total Protein: 7.9 g/dL (ref 6.5–8.1)

## 2019-04-07 MED ORDER — PROCHLORPERAZINE MALEATE 10 MG PO TABS: 10.0000 mg | ORAL_TABLET | Freq: Once | ORAL | Status: DC

## 2019-04-07 MED ORDER — BORTEZOMIB CHEMO SQ INJECTION 3.5 MG (2.5MG/ML)
1.3000 mg/m2 | Freq: Once | INTRAMUSCULAR | Status: DC
  Filled 2019-04-07: qty 1

## 2019-04-07 NOTE — Progress Notes (Signed)
Ok to treat with ANC 1.1 per Dr. Marin Olp.   This is suppose to be patient's week off. Hold injection today per Dr. Marin Olp.

## 2019-04-07 NOTE — Patient Instructions (Signed)
[facility] Discharge Instructions for Patients Receiving Chemotherapy  Today you received the following chemotherapy agents Velcade To help prevent nausea and vomiting after your treatment, we encourage you to take your nausea medication as prescribed.   If you develop nausea and vomiting that is not controlled by your nausea medication, call the clinic.   BELOW ARE SYMPTOMS THAT SHOULD BE REPORTED IMMEDIATELY:  *FEVER GREATER THAN 100.5 F  *CHILLS WITH OR WITHOUT FEVER  NAUSEA AND VOMITING THAT IS NOT CONTROLLED WITH YOUR NAUSEA MEDICATION  *UNUSUAL SHORTNESS OF BREATH  *UNUSUAL BRUISING OR BLEEDING  TENDERNESS IN MOUTH AND THROAT WITH OR WITHOUT PRESENCE OF ULCERS  *URINARY PROBLEMS  *BOWEL PROBLEMS  UNUSUAL RASH Items with * indicate a potential emergency and should be followed up as soon as possible.  Feel free to call the clinic should you have any questions or concerns. The clinic phone number is [phone].  Please show the CHEMO ALERT CARD at check-in to the Emergency Department and triage nurse.   

## 2019-04-12 DIAGNOSIS — R82998 Other abnormal findings in urine: Secondary | ICD-10-CM | POA: Diagnosis not present

## 2019-04-12 DIAGNOSIS — R300 Dysuria: Secondary | ICD-10-CM | POA: Diagnosis not present

## 2019-04-12 DIAGNOSIS — N390 Urinary tract infection, site not specified: Secondary | ICD-10-CM | POA: Diagnosis not present

## 2019-04-12 DIAGNOSIS — R42 Dizziness and giddiness: Secondary | ICD-10-CM | POA: Diagnosis not present

## 2019-04-12 DIAGNOSIS — Z1159 Encounter for screening for other viral diseases: Secondary | ICD-10-CM | POA: Diagnosis not present

## 2019-04-14 ENCOUNTER — Inpatient Hospital Stay: Payer: Medicare Other | Attending: Hematology & Oncology

## 2019-04-14 ENCOUNTER — Inpatient Hospital Stay: Payer: Medicare Other

## 2019-04-14 ENCOUNTER — Other Ambulatory Visit: Payer: Self-pay

## 2019-04-14 VITALS — BP 144/90 | HR 73 | Temp 98.2°F | Resp 20

## 2019-04-14 DIAGNOSIS — Z5112 Encounter for antineoplastic immunotherapy: Secondary | ICD-10-CM | POA: Diagnosis not present

## 2019-04-14 DIAGNOSIS — C9000 Multiple myeloma not having achieved remission: Secondary | ICD-10-CM | POA: Diagnosis not present

## 2019-04-14 LAB — CBC WITH DIFFERENTIAL (CANCER CENTER ONLY)
Abs Immature Granulocytes: 0 10*3/uL (ref 0.00–0.07)
Basophils Absolute: 0.1 10*3/uL (ref 0.0–0.1)
Basophils Relative: 4 %
Eosinophils Absolute: 0.1 10*3/uL (ref 0.0–0.5)
Eosinophils Relative: 2 %
HCT: 35.8 % — ABNORMAL LOW (ref 36.0–46.0)
Hemoglobin: 11.3 g/dL — ABNORMAL LOW (ref 12.0–15.0)
Immature Granulocytes: 0 %
Lymphocytes Relative: 35 %
Lymphs Abs: 1.1 10*3/uL (ref 0.7–4.0)
MCH: 28.9 pg (ref 26.0–34.0)
MCHC: 31.6 g/dL (ref 30.0–36.0)
MCV: 91.6 fL (ref 80.0–100.0)
Monocytes Absolute: 0.5 10*3/uL (ref 0.1–1.0)
Monocytes Relative: 16 %
Neutro Abs: 1.4 10*3/uL — ABNORMAL LOW (ref 1.7–7.7)
Neutrophils Relative %: 43 %
Platelet Count: 229 10*3/uL (ref 150–400)
RBC: 3.91 MIL/uL (ref 3.87–5.11)
RDW: 15.1 % (ref 11.5–15.5)
WBC Count: 3.2 10*3/uL — ABNORMAL LOW (ref 4.0–10.5)
nRBC: 0 % (ref 0.0–0.2)

## 2019-04-14 LAB — CMP (CANCER CENTER ONLY)
ALT: 12 U/L (ref 0–44)
AST: 16 U/L (ref 15–41)
Albumin: 3.9 g/dL (ref 3.5–5.0)
Alkaline Phosphatase: 44 U/L (ref 38–126)
Anion gap: 8 (ref 5–15)
BUN: 15 mg/dL (ref 8–23)
CO2: 27 mmol/L (ref 22–32)
Calcium: 9.2 mg/dL (ref 8.9–10.3)
Chloride: 107 mmol/L (ref 98–111)
Creatinine: 0.69 mg/dL (ref 0.44–1.00)
GFR, Est AFR Am: >60 mL/min (ref >60)
GFR, Estimated: >60 mL/min (ref >60)
Glucose, Bld: 102 mg/dL — ABNORMAL HIGH (ref 70–99)
Potassium: 3.9 mmol/L (ref 3.5–5.1)
Sodium: 142 mmol/L (ref 135–145)
Total Bilirubin: 0.5 mg/dL (ref 0.3–1.2)
Total Protein: 7.9 g/dL (ref 6.5–8.1)

## 2019-04-14 MED ORDER — BORTEZOMIB CHEMO SQ INJECTION 3.5 MG (2.5MG/ML)
1.3000 mg/m2 | Freq: Once | INTRAMUSCULAR | Status: AC
  Administered 2019-04-14: 2.5 mg via SUBCUTANEOUS
  Filled 2019-04-14: qty 1

## 2019-04-14 MED ORDER — PROCHLORPERAZINE MALEATE 10 MG PO TABS: 10.0000 mg | ORAL_TABLET | Freq: Once | ORAL | Status: DC

## 2019-04-14 NOTE — Patient Instructions (Signed)
[facility] Discharge Instructions for Patients Receiving Chemotherapy  Today you received the following chemotherapy agents:  Velcade  To help prevent nausea and vomiting after your treatment, we encourage you to take your nausea medication as ordered per MD.    If you develop nausea and vomiting that is not controlled by your nausea medication, call the clinic.   BELOW ARE SYMPTOMS THAT SHOULD BE REPORTED IMMEDIATELY:  *FEVER GREATER THAN 100.5 F  *CHILLS WITH OR WITHOUT FEVER  NAUSEA AND VOMITING THAT IS NOT CONTROLLED WITH YOUR NAUSEA MEDICATION  *UNUSUAL SHORTNESS OF BREATH  *UNUSUAL BRUISING OR BLEEDING  TENDERNESS IN MOUTH AND THROAT WITH OR WITHOUT PRESENCE OF ULCERS  *URINARY PROBLEMS  *BOWEL PROBLEMS  UNUSUAL RASH Items with * indicate a potential emergency and should be followed up as soon as possible.  Feel free to call the clinic should you have any questions or concerns. The clinic phone number is [phone].  Please show the CHEMO ALERT CARD at check-in to the Emergency Department and triage nurse.   

## 2019-04-20 DIAGNOSIS — R5383 Other fatigue: Secondary | ICD-10-CM | POA: Diagnosis not present

## 2019-04-20 DIAGNOSIS — R42 Dizziness and giddiness: Secondary | ICD-10-CM | POA: Diagnosis not present

## 2019-04-20 DIAGNOSIS — R600 Localized edema: Secondary | ICD-10-CM | POA: Diagnosis not present

## 2019-04-20 DIAGNOSIS — D539 Nutritional anemia, unspecified: Secondary | ICD-10-CM | POA: Diagnosis not present

## 2019-04-21 ENCOUNTER — Inpatient Hospital Stay: Payer: Medicare Other

## 2019-04-21 ENCOUNTER — Inpatient Hospital Stay (HOSPITAL_BASED_OUTPATIENT_CLINIC_OR_DEPARTMENT_OTHER): Payer: Medicare Other | Admitting: Hematology & Oncology

## 2019-04-21 ENCOUNTER — Other Ambulatory Visit: Payer: Self-pay

## 2019-04-21 ENCOUNTER — Encounter: Payer: Self-pay | Admitting: Hematology & Oncology

## 2019-04-21 VITALS — BP 139/73 | HR 80 | Temp 98.4°F | Resp 16 | Wt 185.0 lb

## 2019-04-21 DIAGNOSIS — C9000 Multiple myeloma not having achieved remission: Secondary | ICD-10-CM

## 2019-04-21 DIAGNOSIS — Z5112 Encounter for antineoplastic immunotherapy: Secondary | ICD-10-CM | POA: Diagnosis not present

## 2019-04-21 LAB — CBC WITH DIFFERENTIAL (CANCER CENTER ONLY)
Abs Immature Granulocytes: 0.01 10*3/uL (ref 0.00–0.07)
Basophils Absolute: 0.1 10*3/uL (ref 0.0–0.1)
Basophils Relative: 3 %
Eosinophils Absolute: 0.1 10*3/uL (ref 0.0–0.5)
Eosinophils Relative: 5 %
HCT: 34.2 % — ABNORMAL LOW (ref 36.0–46.0)
Hemoglobin: 11.1 g/dL — ABNORMAL LOW (ref 12.0–15.0)
Immature Granulocytes: 0 %
Lymphocytes Relative: 31 %
Lymphs Abs: 0.8 10*3/uL (ref 0.7–4.0)
MCH: 29.3 pg (ref 26.0–34.0)
MCHC: 32.5 g/dL (ref 30.0–36.0)
MCV: 90.2 fL (ref 80.0–100.0)
Monocytes Absolute: 0.2 10*3/uL (ref 0.1–1.0)
Monocytes Relative: 8 %
Neutro Abs: 1.3 10*3/uL — ABNORMAL LOW (ref 1.7–7.7)
Neutrophils Relative %: 53 %
Platelet Count: 109 10*3/uL — ABNORMAL LOW (ref 150–400)
RBC: 3.79 MIL/uL — ABNORMAL LOW (ref 3.87–5.11)
RDW: 15.1 % (ref 11.5–15.5)
WBC Count: 2.5 10*3/uL — ABNORMAL LOW (ref 4.0–10.5)
nRBC: 0 % (ref 0.0–0.2)

## 2019-04-21 LAB — CMP (CANCER CENTER ONLY)
ALT: 13 U/L (ref 0–44)
AST: 19 U/L (ref 15–41)
Albumin: 3.8 g/dL (ref 3.5–5.0)
Alkaline Phosphatase: 36 U/L — ABNORMAL LOW (ref 38–126)
Anion gap: 9 (ref 5–15)
BUN: 17 mg/dL (ref 8–23)
CO2: 26 mmol/L (ref 22–32)
Calcium: 9.3 mg/dL (ref 8.9–10.3)
Chloride: 106 mmol/L (ref 98–111)
Creatinine: 0.68 mg/dL (ref 0.44–1.00)
GFR, Est AFR Am: >60 mL/min (ref >60)
GFR, Estimated: >60 mL/min (ref >60)
Glucose, Bld: 119 mg/dL — ABNORMAL HIGH (ref 70–99)
Potassium: 3.2 mmol/L — ABNORMAL LOW (ref 3.5–5.1)
Sodium: 141 mmol/L (ref 135–145)
Total Bilirubin: 0.6 mg/dL (ref 0.3–1.2)
Total Protein: 7.4 g/dL (ref 6.5–8.1)

## 2019-04-21 MED ORDER — PROCHLORPERAZINE MALEATE 10 MG PO TABS: 10.0000 mg | ORAL_TABLET | Freq: Once | ORAL | Status: DC

## 2019-04-21 MED ORDER — PROCHLORPERAZINE MALEATE 10 MG PO TABS
ORAL_TABLET | ORAL | Status: AC
  Filled 2019-04-21: qty 1

## 2019-04-21 MED ORDER — BORTEZOMIB CHEMO SQ INJECTION 3.5 MG (2.5MG/ML)
1.3000 mg/m2 | Freq: Once | INTRAMUSCULAR | Status: AC
  Administered 2019-04-21: 2.5 mg via SUBCUTANEOUS
  Filled 2019-04-21: qty 1

## 2019-04-21 NOTE — Patient Instructions (Addendum)
Bortezomib injection What is this medicine? BORTEZOMIB (bor TEZ oh mib) is a medicine that targets proteins in cancer cells and stops the cancer cells from growing. It is used to treat multiple myeloma and mantle-cell lymphoma. This medicine may be used for other purposes; ask your health care provider or pharmacist if you have questions. COMMON BRAND NAME(S): Velcade What should I tell my health care provider before I take this medicine? They need to know if you have any of these conditions: -diabetes -heart disease -irregular heartbeat -liver disease -on hemodialysis -low blood counts, like low white blood cells, platelets, or hemoglobin -peripheral neuropathy -taking medicine for blood pressure -an unusual or allergic reaction to bortezomib, mannitol, boron, other medicines, foods, dyes, or preservatives -pregnant or trying to get pregnant -breast-feeding How should I use this medicine? This medicine is for injection into a vein or for injection under the skin. It is given by a health care professional in a hospital or clinic setting. Talk to your pediatrician regarding the use of this medicine in children. Special care may be needed. Overdosage: If you think you have taken too much of this medicine contact a poison control center or emergency room at once. NOTE: This medicine is only for you. Do not share this medicine with others. What if I miss a dose? It is important not to miss your dose. Call your doctor or health care professional if you are unable to keep an appointment. What may interact with this medicine? This medicine may interact with the following medications: -ketoconazole -rifampin -ritonavir -St. John's Wort This list may not describe all possible interactions. Give your health care provider a list of all the medicines, herbs, non-prescription drugs, or dietary supplements you use. Also tell them if you smoke, drink alcohol, or use illegal drugs. Some items may  interact with your medicine. What should I watch for while using this medicine? You may get drowsy or dizzy. Do not drive, use machinery, or do anything that needs mental alertness until you know how this medicine affects you. Do not stand or sit up quickly, especially if you are an older patient. This reduces the risk of dizzy or fainting spells. In some cases, you may be given additional medicines to help with side effects. Follow all directions for their use. Call your doctor or health care professional for advice if you get a fever, chills or sore throat, or other symptoms of a cold or flu. Do not treat yourself. This drug decreases your body's ability to fight infections. Try to avoid being around people who are sick. This medicine may increase your risk to bruise or bleed. Call your doctor or health care professional if you notice any unusual bleeding. You may need blood work done while you are taking this medicine. In some patients, this medicine may cause a serious brain infection that may cause death. If you have any problems seeing, thinking, speaking, walking, or standing, tell your doctor right away. If you cannot reach your doctor, urgently seek other source of medical care. Check with your doctor or health care professional if you get an attack of severe diarrhea, nausea and vomiting, or if you sweat a lot. The loss of too much body fluid can make it dangerous for you to take this medicine. Do not become pregnant while taking this medicine or for at least 7 months after stopping it. Women should inform their doctor if they wish to become pregnant or think they might be pregnant. Men should not   father a child while taking this medicine and for at least 4 months after stopping it. There is a potential for serious side effects to an unborn child. Talk to your health care professional or pharmacist for more information. Do not breast-feed an infant while taking this medicine or for 2 months after  stopping it. This medicine may interfere with the ability to have a child. You should talk with your doctor or health care professional if you are concerned about your fertility. What side effects may I notice from receiving this medicine? Side effects that you should report to your doctor or health care professional as soon as possible: -allergic reactions like skin rash, itching or hives, swelling of the face, lips, or tongue -breathing problems -changes in hearing -changes in vision -fast, irregular heartbeat -feeling faint or lightheaded, falls -pain, tingling, numbness in the hands or feet -right upper belly pain -seizures -swelling of the ankles, feet, hands -unusual bleeding or bruising -unusually weak or tired -vomiting -yellowing of the eyes or skin Side effects that usually do not require medical attention (report to your doctor or health care professional if they continue or are bothersome): -changes in emotions or moods -constipation -diarrhea -loss of appetite -headache -irritation at site where injected -nausea This list may not describe all possible side effects. Call your doctor for medical advice about side effects. You may report side effects to FDA at 1-800-FDA-1088. Where should I keep my medicine? This drug is given in a hospital or clinic and will not be stored at home. NOTE: This sheet is a summary. It may not cover all possible information. If you have questions about this medicine, talk to your doctor, pharmacist, or health care provider.  2019 Elsevier/Gold Standard (2018-03-07 16:29:31) Bortezomib injection What is this medicine? BORTEZOMIB (bor TEZ oh mib) is a medicine that targets proteins in cancer cells and stops the cancer cells from growing. It is used to treat multiple myeloma and mantle-cell lymphoma. This medicine may be used for other purposes; ask your health care provider or pharmacist if you have questions. COMMON BRAND NAME(S):  Velcade What should I tell my health care provider before I take this medicine? They need to know if you have any of these conditions: -diabetes -heart disease -irregular heartbeat -liver disease -on hemodialysis -low blood counts, like low white blood cells, platelets, or hemoglobin -peripheral neuropathy -taking medicine for blood pressure -an unusual or allergic reaction to bortezomib, mannitol, boron, other medicines, foods, dyes, or preservatives -pregnant or trying to get pregnant -breast-feeding How should I use this medicine? This medicine is for injection into a vein or for injection under the skin. It is given by a health care professional in a hospital or clinic setting. Talk to your pediatrician regarding the use of this medicine in children. Special care may be needed. Overdosage: If you think you have taken too much of this medicine contact a poison control center or emergency room at once. NOTE: This medicine is only for you. Do not share this medicine with others. What if I miss a dose? It is important not to miss your dose. Call your doctor or health care professional if you are unable to keep an appointment. What may interact with this medicine? This medicine may interact with the following medications: -ketoconazole -rifampin -ritonavir -St. John's Wort This list may not describe all possible interactions. Give your health care provider a list of all the medicines, herbs, non-prescription drugs, or dietary supplements you use. Also tell them  if you smoke, drink alcohol, or use illegal drugs. Some items may interact with your medicine. What should I watch for while using this medicine? You may get drowsy or dizzy. Do not drive, use machinery, or do anything that needs mental alertness until you know how this medicine affects you. Do not stand or sit up quickly, especially if you are an older patient. This reduces the risk of dizzy or fainting spells. In some cases, you  may be given additional medicines to help with side effects. Follow all directions for their use. Call your doctor or health care professional for advice if you get a fever, chills or sore throat, or other symptoms of a cold or flu. Do not treat yourself. This drug decreases your body's ability to fight infections. Try to avoid being around people who are sick. This medicine may increase your risk to bruise or bleed. Call your doctor or health care professional if you notice any unusual bleeding. You may need blood work done while you are taking this medicine. In some patients, this medicine may cause a serious brain infection that may cause death. If you have any problems seeing, thinking, speaking, walking, or standing, tell your doctor right away. If you cannot reach your doctor, urgently seek other source of medical care. Check with your doctor or health care professional if you get an attack of severe diarrhea, nausea and vomiting, or if you sweat a lot. The loss of too much body fluid can make it dangerous for you to take this medicine. Do not become pregnant while taking this medicine or for at least 7 months after stopping it. Women should inform their doctor if they wish to become pregnant or think they might be pregnant. Men should not father a child while taking this medicine and for at least 4 months after stopping it. There is a potential for serious side effects to an unborn child. Talk to your health care professional or pharmacist for more information. Do not breast-feed an infant while taking this medicine or for 2 months after stopping it. This medicine may interfere with the ability to have a child. You should talk with your doctor or health care professional if you are concerned about your fertility. What side effects may I notice from receiving this medicine? Side effects that you should report to your doctor or health care professional as soon as possible: -allergic reactions like  skin rash, itching or hives, swelling of the face, lips, or tongue -breathing problems -changes in hearing -changes in vision -fast, irregular heartbeat -feeling faint or lightheaded, falls -pain, tingling, numbness in the hands or feet -right upper belly pain -seizures -swelling of the ankles, feet, hands -unusual bleeding or bruising -unusually weak or tired -vomiting -yellowing of the eyes or skin Side effects that usually do not require medical attention (report to your doctor or health care professional if they continue or are bothersome): -changes in emotions or moods -constipation -diarrhea -loss of appetite -headache -irritation at site where injected -nausea This list may not describe all possible side effects. Call your doctor for medical advice about side effects. You may report side effects to FDA at 1-800-FDA-1088. Where should I keep my medicine? This drug is given in a hospital or clinic and will not be stored at home. NOTE: This sheet is a summary. It may not cover all possible information. If you have questions about this medicine, talk to your doctor, pharmacist, or health care provider.  2019 Elsevier/Gold Standard (2018-03-07  16:29:31)  

## 2019-04-21 NOTE — Progress Notes (Signed)
Ok to treat per Dr. Marin Olp

## 2019-04-21 NOTE — Progress Notes (Signed)
Hematology and Oncology Follow Up Visit  Tami [name] [id number] 11/05/1956 63 y.o. 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  Tami [name] is managing the pomalidomide fairly well.  Tami [name] does have some issues when Tami [name] first starts the pomalidomide.  Tami [name] says that Tami [name] feels tired.  Tami [name] myeloma studies have showed continued improvement.  Tami [name] last a month ago, Tami [name] M spike was down to 0.9 g/dL.  Tami [name] IgG level was 2414 mg/dL.  Tami [name] kappa light chain was 5.8 mg/dL.  Tami [name] had no abdominal pain.  Is been no issues with nausea or vomiting.  Tami [name] has had no diarrhea.  There is been no leg swelling.  Tami [name] says that on occasion Tami [name] ankles will swell up.  Tami [name] has had no rashes.  Of note, Tami [name] daughters get married next February.  This is exciting to Tami [name].  Overall, Tami [name] performance status is ECOG 1.  Medications:  Allergies as of 04/21/2019      Reactions   Benazepril Anaphylaxis, Swelling   angioedema Throat and lip swelling   Ondansetron Hcl Hives   Redness and hives post IV admin on 07/05/17   Codeine Nausea And Vomiting   Morphine Hives   Redness and hives noted post IV admin on 07/05/17      Medication List       Accurate as of April 21, 2019 11:00 AM. If you have any questions, ask your nurse or doctor.        STOP taking these medications   nitrofurantoin 100 MG capsule Commonly known as: MACRODANTIN Stopped by: [name], MD     TAKE these medications   acetaminophen 325 MG tablet Commonly known as: TYLENOL Take 650 mg by mouth.   albuterol 108 (90 Base) MCG/ACT inhaler Commonly known as: VENTOLIN HFA Inhale 2 puffs into the lungs every 6 (six) hours as needed for wheezing or shortness of breath.   alendronate 70 MG tablet  Commonly known as: FOSAMAX Take 70 mg by mouth daily.   amLODipine 10 MG tablet Commonly known as: NORVASC Take 1 tablet (10 mg total) by mouth daily.   aspirin EC 81 MG tablet Take 1 tablet (81 mg total) by mouth daily.   carvedilol 25 MG tablet Commonly known as: COREG Take 1 tablet (25 mg total) by mouth 2 (two) times daily with a meal.   cetirizine 10 MG tablet Commonly known as: ZYRTEC Take 1 tablet (10 mg total) by mouth every morning.   cyclobenzaprine 10 MG tablet Commonly known as: FLEXERIL Take 10 mg by mouth as needed.   dexamethasone 4 MG tablet Commonly known as: DECADRON Take 5 pills at one time with food ONCE a WEEK!!   dicyclomine 20 MG tablet Commonly known as: Bentyl Take 1 tablet (20 mg total) by mouth 3 (three) times daily as needed for spasms.   erythromycin ophthalmic ointment Place a 1/2 inch ribbon of ointment into the lower eyelid.   famciclovir 250 MG tablet Commonly known as: FAMVIR Take 1 tablet (250 mg total) by mouth daily.   famotidine 20 MG tablet Commonly known as: Pepcid Take 1 tablet (20 mg total) by mouth 2 (two) times daily.   fluticasone 50 MCG/ACT nasal spray Commonly known as: Pandora  2 sprays into both nostrils daily.   HYDROcodone-acetaminophen 5-325 MG tablet Commonly known as: NORCO/VICODIN Take 1 tablet by mouth every 4 (four) hours as needed for moderate pain or severe pain.   hyoscyamine 0.125 MG SL tablet Commonly known as: LEVSIN SL Place 1 tablet (0.125 mg total) under the tongue every 4 (four) hours as needed.   levocetirizine 5 MG tablet Commonly known as: XYZAL Take 1 tablet (5 mg total) by mouth every evening.   lidocaine 5 % Commonly known as: Lidoderm Place 1 patch onto the skin daily. Remove & Discard patch within 12 hours or as directed by MD   methylPREDNISolone 4 MG tablet Commonly known as: Medrol 5 tab po qd X 1d then 4 tab po qd X 1d then 3 tab po qd X 1d then 2 tab po qd then 1 tab po  qd   MULTIVITAMIN ADULT PO Take by mouth.   mupirocin ointment 2 % Commonly known as: Bactroban Apply 1 application topically 2 (two) times daily.   ondansetron 4 MG tablet Commonly known as: Zofran Take 1 tablet (4 mg total) by mouth every 8 (eight) hours as needed for nausea or vomiting.   pantoprazole 40 MG tablet Commonly known as: PROTONIX Take 1 tablet (40 mg total) by mouth 2 (two) times daily.   pomalidomide 3 MG capsule Commonly known as: POMALYST Take one capsule daily-Days #1-21. Repeat every 28 days. ENID#[id number]   potassium chloride SA 20 MEQ tablet Commonly known as: K-DUR Take 2 tablets (40 mEq total) by mouth once for 1 dose.   Premarin vaginal cream Generic drug: conjugated estrogens Place vaginally.   prochlorperazine 10 MG tablet Commonly known as: COMPAZINE Take 1 tablet (10 mg total) by mouth every 6 (six) hours as needed (Nausea or vomiting).   ranitidine 15 MG/ML syrup Commonly known as: ZANTAC Take by mouth.   sucralfate 1 g tablet Commonly known as: Carafate Take 1 tablet (1 g total) by mouth 4 (four) times daily -  with meals and at bedtime.   topiramate 25 MG tablet Commonly known as: Topamax Take 1 tablet (25 mg total) by mouth 2 (two) times daily.   triamterene-hydrochlorothiazide 37.5-25 MG tablet Commonly known as: Maxzide-25 Take 1 tablet by mouth daily.   trimethoprim-polymyxin b ophthalmic solution Commonly known as: Polytrim Place 1 drop into both eyes 3 (three) times daily as needed.       Allergies:  Allergies  Allergen Reactions  . Benazepril Anaphylaxis and Swelling    angioedema Throat and lip swelling  . Ondansetron Hcl Hives    Redness and hives post IV admin on 07/05/17  . Codeine Nausea And Vomiting  . Morphine Hives    Redness and hives noted post IV admin on 07/05/17    Past Medical History, Surgical history, Social history, and Family History were reviewed and updated.  Review of Systems: Review of  Systems  Constitutional: Negative.   HENT: Negative.   Eyes: Negative.   Respiratory: Negative.   Cardiovascular: Negative.   Gastrointestinal: Negative.   Genitourinary: Negative.   Musculoskeletal: Negative.   Skin: Positive for rash.  Neurological: Negative.   Endo/Heme/Allergies: Negative.   Psychiatric/Behavioral: Negative.      Physical Exam:  weight is 185 lb (83.9 kg). Tami [name] oral temperature is 98.4 F (36.9 C). Tami [name] blood pressure is 139/73 and Tami [name] [name] is 80. Tami [name] respiration is 16 and oxygen saturation is 98%.   Wt Readings from Last 3 Encounters:  04/21/19 185 lb (83.9  kg)  03/24/19 186 lb (84.4 kg)  03/24/19 186 lb (84.4 kg)    Physical Exam Vitals signs reviewed.  HENT:     Head: Normocephalic and atraumatic.  Eyes:     Pupils: Pupils are equal, round, and reactive to light.  Neck:     Musculoskeletal: Normal range of motion.  Cardiovascular:     Rate and Rhythm: Normal rate and regular rhythm.     Heart sounds: Normal heart sounds.  Pulmonary:     Effort: Pulmonary effort is normal.     Breath sounds: Normal breath sounds.  Abdominal:     General: Bowel sounds are normal.     Palpations: Abdomen is soft.  Musculoskeletal: Normal range of motion.        General: No tenderness or deformity.  Lymphadenopathy:     Cervical: No cervical adenopathy.  Skin:    General: Skin is warm and dry.     Findings: No erythema or rash.  Neurological:     Mental Status: Tami [name] is alert and oriented to person, place, and time.  Psychiatric:        Behavior: Behavior normal.        Thought Content: Thought content normal.        Judgment: Judgment normal.      Lab Results  Component Value Date   WBC 2.5 (L) 04/21/2019   HGB 11.1 (L) 04/21/2019   HCT 34.2 (L) 04/21/2019   MCV 90.2 04/21/2019   PLT 109 (L) 04/21/2019   Lab Results  Component Value Date   FERRITIN 169 09/16/2016   IRON 59 09/16/2016   TIBC 281 09/16/2016   UIBC 222 09/16/2016   IRONPCTSAT 21  09/16/2016   Lab Results  Component Value Date   RETICCTPCT 0.8 07/16/2016   RBC 3.79 (L) 04/21/2019   RETICCTABS 32,640 07/16/2016   Lab Results  Component Value Date   KPAFRELGTCHN 58.5 (H) 03/24/2019   LAMBDASER 10.8 03/24/2019   KAPLAMBRATIO 5.42 (H) 03/24/2019   Lab Results  Component Value Date   IGGSERUM 2,414 (H) 03/24/2019   IGGSERUM 2,552 (H) 03/24/2019   IGA 232 03/24/2019   IGA 252 03/24/2019   IGMSERUM 104 03/24/2019   IGMSERUM 113 03/24/2019   Lab Results  Component Value Date   TOTALPROTELP 7.9 03/24/2019   ALBUMINELP 3.5 03/24/2019   A1GS 0.2 03/24/2019   A2GS 1.0 03/24/2019   BETS 1.1 03/24/2019   BETA2SER 0.5 07/16/2016   GAMS 2.1 (H) 03/24/2019   MSPIKE 0.9 (H) 03/24/2019   SPEI Comment 10/07/2018     Chemistry      Component Value Date/Time   NA 141 04/21/2019 0929   NA 143 11/08/2017 1127   NA 140 12/31/2016 1000   K 3.2 (L) 04/21/2019 0929   K 4.0 11/08/2017 1127   K 3.6 12/31/2016 1000   CL 106 04/21/2019 0929   CL 107 11/08/2017 1127   CO2 26 04/21/2019 0929   CO2 25 11/08/2017 1127   CO2 24 12/31/2016 1000   BUN 17 04/21/2019 0929   BUN 19 11/08/2017 1127   BUN 22.3 12/31/2016 1000   CREATININE 0.68 04/21/2019 0929   CREATININE 0.9 11/08/2017 1127   CREATININE 0.8 12/31/2016 1000      Component Value Date/Time   CALCIUM 9.3 04/21/2019 0929   CALCIUM 9.9 11/08/2017 1127   CALCIUM 9.5 12/31/2016 1000   ALKPHOS 36 (L) 04/21/2019 0929   ALKPHOS 44 11/08/2017 1127   ALKPHOS 51 12/31/2016 1000  AST 19 04/21/2019 0929   AST 28 12/31/2016 1000   ALT 13 04/21/2019 0929   ALT 36 11/08/2017 1127   ALT 16 12/31/2016 1000   BILITOT 0.6 04/21/2019 0929   BILITOT 0.46 12/31/2016 1000      Impression and Plan: Tami [name] is a very pleasant 63 yo African American female with a previous IgG kappa MGUS with progression to myeloma.    I will keep Tami [name] on the same schedule for right now of the pomalidomide.  Again, if we see a nice  response, I will give Tami [name] a 2-week break in between 21-day cycles.  Tami [name] does the Velcade every other week.  I think this is working for Tami [name] fairly well.  We will get Tami [name] back in 1 month.   [name], MD 6/12/202011:00 AM

## 2019-04-22 LAB — IGG, IGA, IGM
IgA: 189 mg/dL (ref 87–352)
IgG (Immunoglobin G), Serum: 2011 mg/dL — ABNORMAL HIGH (ref 586–1602)
IgM (Immunoglobulin M), Srm: 94 mg/dL (ref 26–217)

## 2019-04-23 DIAGNOSIS — J40 Bronchitis, not specified as acute or chronic: Secondary | ICD-10-CM | POA: Diagnosis not present

## 2019-04-24 DIAGNOSIS — G43109 Migraine with aura, not intractable, without status migrainosus: Secondary | ICD-10-CM | POA: Diagnosis not present

## 2019-04-24 DIAGNOSIS — R42 Dizziness and giddiness: Secondary | ICD-10-CM | POA: Diagnosis not present

## 2019-04-24 LAB — KAPPA/LAMBDA LIGHT CHAINS
Kappa free light chain: 59.1 mg/L — ABNORMAL HIGH (ref 3.3–19.4)
Kappa, lambda light chain ratio: 5.05 — ABNORMAL HIGH (ref 0.26–1.65)
Lambda free light chains: 11.7 mg/L (ref 5.7–26.3)

## 2019-04-25 DIAGNOSIS — R42 Dizziness and giddiness: Secondary | ICD-10-CM | POA: Diagnosis not present

## 2019-04-26 DIAGNOSIS — R300 Dysuria: Secondary | ICD-10-CM | POA: Diagnosis not present

## 2019-04-26 DIAGNOSIS — H00015 Hordeolum externum left lower eyelid: Secondary | ICD-10-CM | POA: Diagnosis not present

## 2019-04-26 DIAGNOSIS — I447 Left bundle-branch block, unspecified: Secondary | ICD-10-CM | POA: Diagnosis not present

## 2019-04-26 DIAGNOSIS — I10 Essential (primary) hypertension: Secondary | ICD-10-CM | POA: Diagnosis not present

## 2019-04-26 DIAGNOSIS — G4733 Obstructive sleep apnea (adult) (pediatric): Secondary | ICD-10-CM | POA: Diagnosis not present

## 2019-04-26 DIAGNOSIS — R42 Dizziness and giddiness: Secondary | ICD-10-CM | POA: Diagnosis not present

## 2019-04-26 DIAGNOSIS — G43109 Migraine with aura, not intractable, without status migrainosus: Secondary | ICD-10-CM | POA: Diagnosis not present

## 2019-04-26 DIAGNOSIS — R51 Headache: Secondary | ICD-10-CM | POA: Diagnosis not present

## 2019-04-26 DIAGNOSIS — I420 Dilated cardiomyopathy: Secondary | ICD-10-CM | POA: Diagnosis not present

## 2019-04-26 LAB — IMMUNOFIXATION REFLEX, SERUM
IgA: 204 mg/dL (ref 87–352)
IgG (Immunoglobin G), Serum: 2072 mg/dL — ABNORMAL HIGH (ref 586–1602)
IgM (Immunoglobulin M), Srm: 92 mg/dL (ref 26–217)

## 2019-04-26 LAB — PROTEIN ELECTROPHORESIS, SERUM, WITH REFLEX
A/G Ratio: 0.8 (ref 0.7–1.7)
Albumin ELP: 3.3 g/dL (ref 2.9–4.4)
Alpha-1-Globulin: 0.2 g/dL (ref 0.0–0.4)
Alpha-2-Globulin: 1 g/dL (ref 0.4–1.0)
Beta Globulin: 0.9 g/dL (ref 0.7–1.3)
Gamma Globulin: 1.8 g/dL (ref 0.4–1.8)
Globulin, Total: 3.9 g/dL (ref 2.2–3.9)
M-Spike, %: 0.6 g/dL — ABNORMAL HIGH
SPEP Interpretation: 0
Total Protein ELP: 7.2 g/dL (ref 6.0–8.5)

## 2019-04-27 ENCOUNTER — Other Ambulatory Visit: Payer: Self-pay | Admitting: *Deleted

## 2019-04-27 DIAGNOSIS — C9000 Multiple myeloma not having achieved remission: Secondary | ICD-10-CM

## 2019-04-27 MED ORDER — POMALIDOMIDE 3 MG PO CAPS: ORAL_CAPSULE | ORAL | 0 refills | Status: DC

## 2019-04-28 ENCOUNTER — Inpatient Hospital Stay: Payer: Medicare Other

## 2019-04-28 ENCOUNTER — Other Ambulatory Visit: Payer: Self-pay

## 2019-04-28 VITALS — BP 157/76 | HR 77 | Temp 97.4°F | Resp 18

## 2019-04-28 DIAGNOSIS — C9000 Multiple myeloma not having achieved remission: Secondary | ICD-10-CM | POA: Diagnosis not present

## 2019-04-28 DIAGNOSIS — Z5112 Encounter for antineoplastic immunotherapy: Secondary | ICD-10-CM | POA: Diagnosis not present

## 2019-04-28 LAB — CBC WITH DIFFERENTIAL (CANCER CENTER ONLY)
Abs Immature Granulocytes: 0.02 10*3/uL (ref 0.00–0.07)
Basophils Absolute: 0.1 10*3/uL (ref 0.0–0.1)
Basophils Relative: 3 %
Eosinophils Absolute: 0.2 10*3/uL (ref 0.0–0.5)
Eosinophils Relative: 7 %
HCT: 35.6 % — ABNORMAL LOW (ref 36.0–46.0)
Hemoglobin: 11.4 g/dL — ABNORMAL LOW (ref 12.0–15.0)
Immature Granulocytes: 1 %
Lymphocytes Relative: 34 %
Lymphs Abs: 1 10*3/uL (ref 0.7–4.0)
MCH: 29.4 pg (ref 26.0–34.0)
MCHC: 32 g/dL (ref 30.0–36.0)
MCV: 91.8 fL (ref 80.0–100.0)
Monocytes Absolute: 0.6 10*3/uL (ref 0.1–1.0)
Monocytes Relative: 20 %
Neutro Abs: 1 10*3/uL — ABNORMAL LOW (ref 1.7–7.7)
Neutrophils Relative %: 35 %
Platelet Count: 126 10*3/uL — ABNORMAL LOW (ref 150–400)
RBC: 3.88 MIL/uL (ref 3.87–5.11)
RDW: 15.2 % (ref 11.5–15.5)
WBC Count: 2.9 10*3/uL — ABNORMAL LOW (ref 4.0–10.5)
nRBC: 0 % (ref 0.0–0.2)

## 2019-04-28 LAB — CMP (CANCER CENTER ONLY)
ALT: 13 U/L (ref 0–44)
AST: 15 U/L (ref 15–41)
Albumin: 3.8 g/dL (ref 3.5–5.0)
Alkaline Phosphatase: 40 U/L (ref 38–126)
Anion gap: 7 (ref 5–15)
BUN: 17 mg/dL (ref 8–23)
CO2: 27 mmol/L (ref 22–32)
Calcium: 9.4 mg/dL (ref 8.9–10.3)
Chloride: 107 mmol/L (ref 98–111)
Creatinine: 0.77 mg/dL (ref 0.44–1.00)
GFR, Est AFR Am: >60 mL/min (ref >60)
GFR, Estimated: >60 mL/min (ref >60)
Glucose, Bld: 84 mg/dL (ref 70–99)
Potassium: 3.6 mmol/L (ref 3.5–5.1)
Sodium: 141 mmol/L (ref 135–145)
Total Bilirubin: 0.5 mg/dL (ref 0.3–1.2)
Total Protein: 7.5 g/dL (ref 6.5–8.1)

## 2019-04-28 MED ORDER — BORTEZOMIB CHEMO SQ INJECTION 3.5 MG (2.5MG/ML)
1.3000 mg/m2 | Freq: Once | INTRAMUSCULAR | Status: AC
  Administered 2019-04-28: 2.5 mg via SUBCUTANEOUS
  Filled 2019-04-28: qty 1

## 2019-04-28 MED ORDER — PROCHLORPERAZINE MALEATE 10 MG PO TABS: 10.0000 mg | ORAL_TABLET | Freq: Once | ORAL | Status: DC

## 2019-04-28 NOTE — Progress Notes (Signed)
Ok to treat with ANC 1.0 per Dr. Marin Olp.

## 2019-04-28 NOTE — Patient Instructions (Signed)
[facility] Discharge Instructions for Patients Receiving Chemotherapy  Today you received the following chemotherapy agents Velcade To help prevent nausea and vomiting after your treatment, we encourage you to take your nausea medication as prescribed.   If you develop nausea and vomiting that is not controlled by your nausea medication, call the clinic.   BELOW ARE SYMPTOMS THAT SHOULD BE REPORTED IMMEDIATELY:  *FEVER GREATER THAN 100.5 F  *CHILLS WITH OR WITHOUT FEVER  NAUSEA AND VOMITING THAT IS NOT CONTROLLED WITH YOUR NAUSEA MEDICATION  *UNUSUAL SHORTNESS OF BREATH  *UNUSUAL BRUISING OR BLEEDING  TENDERNESS IN MOUTH AND THROAT WITH OR WITHOUT PRESENCE OF ULCERS  *URINARY PROBLEMS  *BOWEL PROBLEMS  UNUSUAL RASH Items with * indicate a potential emergency and should be followed up as soon as possible.  Feel free to call the clinic should you have any questions or concerns. The clinic phone number is [phone].  Please show the CHEMO ALERT CARD at check-in to the Emergency Department and triage nurse.   

## 2019-05-02 ENCOUNTER — Ambulatory Visit: Payer: Self-pay | Admitting: Family Medicine

## 2019-05-05 ENCOUNTER — Inpatient Hospital Stay: Payer: Medicare Other

## 2019-05-05 ENCOUNTER — Other Ambulatory Visit: Payer: Medicare Other

## 2019-05-08 DIAGNOSIS — G4730 Sleep apnea, unspecified: Secondary | ICD-10-CM | POA: Diagnosis not present

## 2019-05-08 DIAGNOSIS — G43809 Other migraine, not intractable, without status migrainosus: Secondary | ICD-10-CM | POA: Diagnosis not present

## 2019-05-08 DIAGNOSIS — H00013 Hordeolum externum right eye, unspecified eyelid: Secondary | ICD-10-CM | POA: Diagnosis not present

## 2019-05-08 DIAGNOSIS — R42 Dizziness and giddiness: Secondary | ICD-10-CM | POA: Diagnosis not present

## 2019-05-11 ENCOUNTER — Other Ambulatory Visit: Payer: Self-pay

## 2019-05-11 ENCOUNTER — Inpatient Hospital Stay: Payer: Medicare Other | Attending: Hematology & Oncology

## 2019-05-11 ENCOUNTER — Ambulatory Visit: Payer: Medicare Other | Admitting: Hematology & Oncology

## 2019-05-11 ENCOUNTER — Inpatient Hospital Stay: Payer: Medicare Other

## 2019-05-11 VITALS — BP 136/81 | HR 63 | Temp 98.3°F | Resp 18

## 2019-05-11 DIAGNOSIS — Z5112 Encounter for antineoplastic immunotherapy: Secondary | ICD-10-CM | POA: Diagnosis not present

## 2019-05-11 DIAGNOSIS — C9000 Multiple myeloma not having achieved remission: Secondary | ICD-10-CM | POA: Insufficient documentation

## 2019-05-11 LAB — CMP (CANCER CENTER ONLY)
ALT: 14 U/L (ref 0–44)
AST: 16 U/L (ref 15–41)
Albumin: 3.9 g/dL (ref 3.5–5.0)
Alkaline Phosphatase: 42 U/L (ref 38–126)
Anion gap: 6 (ref 5–15)
BUN: 16 mg/dL (ref 8–23)
CO2: 28 mmol/L (ref 22–32)
Calcium: 9.5 mg/dL (ref 8.9–10.3)
Chloride: 107 mmol/L (ref 98–111)
Creatinine: 0.65 mg/dL (ref 0.44–1.00)
GFR, Est AFR Am: >60 mL/min (ref >60)
GFR, Estimated: >60 mL/min (ref >60)
Glucose, Bld: 93 mg/dL (ref 70–99)
Potassium: 4 mmol/L (ref 3.5–5.1)
Sodium: 141 mmol/L (ref 135–145)
Total Bilirubin: 0.6 mg/dL (ref 0.3–1.2)
Total Protein: 7.7 g/dL (ref 6.5–8.1)

## 2019-05-11 LAB — CBC WITH DIFFERENTIAL (CANCER CENTER ONLY)
Abs Immature Granulocytes: 0 10*3/uL (ref 0.00–0.07)
Basophils Absolute: 0.1 10*3/uL (ref 0.0–0.1)
Basophils Relative: 3 %
Eosinophils Absolute: 0.1 10*3/uL (ref 0.0–0.5)
Eosinophils Relative: 4 %
HCT: 36.2 % (ref 36.0–46.0)
Hemoglobin: 11.6 g/dL — ABNORMAL LOW (ref 12.0–15.0)
Immature Granulocytes: 0 %
Lymphocytes Relative: 37 %
Lymphs Abs: 1.3 10*3/uL (ref 0.7–4.0)
MCH: 29.6 pg (ref 26.0–34.0)
MCHC: 32 g/dL (ref 30.0–36.0)
MCV: 92.3 fL (ref 80.0–100.0)
Monocytes Absolute: 0.5 10*3/uL (ref 0.1–1.0)
Monocytes Relative: 15 %
Neutro Abs: 1.4 10*3/uL — ABNORMAL LOW (ref 1.7–7.7)
Neutrophils Relative %: 41 %
Platelet Count: 206 10*3/uL (ref 150–400)
RBC: 3.92 MIL/uL (ref 3.87–5.11)
RDW: 15.2 % (ref 11.5–15.5)
WBC Count: 3.4 10*3/uL — ABNORMAL LOW (ref 4.0–10.5)
nRBC: 0 % (ref 0.0–0.2)

## 2019-05-11 MED ORDER — PROCHLORPERAZINE MALEATE 10 MG PO TABS
10.0000 mg | ORAL_TABLET | Freq: Once | ORAL | Status: AC
  Administered 2019-05-11: 10:00:00 10 mg via ORAL

## 2019-05-11 MED ORDER — PROCHLORPERAZINE MALEATE 10 MG PO TABS
ORAL_TABLET | ORAL | Status: AC
  Filled 2019-05-11: qty 1

## 2019-05-11 MED ORDER — BORTEZOMIB CHEMO SQ INJECTION 3.5 MG (2.5MG/ML)
1.3000 mg/m2 | Freq: Once | INTRAMUSCULAR | Status: AC
  Administered 2019-05-11: 2.5 mg via SUBCUTANEOUS
  Filled 2019-05-11: qty 1

## 2019-05-11 NOTE — Patient Instructions (Signed)
[facility] Discharge Instructions for Patients Receiving Chemotherapy  Today you received the following chemotherapy agents Velcade To help prevent nausea and vomiting after your treatment, we encourage you to take your nausea medication as prescribed.   If you develop nausea and vomiting that is not controlled by your nausea medication, call the clinic.   BELOW ARE SYMPTOMS THAT SHOULD BE REPORTED IMMEDIATELY:  *FEVER GREATER THAN 100.5 F  *CHILLS WITH OR WITHOUT FEVER  NAUSEA AND VOMITING THAT IS NOT CONTROLLED WITH YOUR NAUSEA MEDICATION  *UNUSUAL SHORTNESS OF BREATH  *UNUSUAL BRUISING OR BLEEDING  TENDERNESS IN MOUTH AND THROAT WITH OR WITHOUT PRESENCE OF ULCERS  *URINARY PROBLEMS  *BOWEL PROBLEMS  UNUSUAL RASH Items with * indicate a potential emergency and should be followed up as soon as possible.  Feel free to call the clinic should you have any questions or concerns. The clinic phone number is [phone].  Please show the CHEMO ALERT CARD at check-in to the Emergency Department and triage nurse.   

## 2019-05-11 NOTE — Progress Notes (Signed)
Ok to treat with ANC 1.4 per Dr Ennever 

## 2019-05-15 ENCOUNTER — Ambulatory Visit (INDEPENDENT_AMBULATORY_CARE_PROVIDER_SITE_OTHER): Payer: Medicare Other | Admitting: Family Medicine

## 2019-05-15 ENCOUNTER — Other Ambulatory Visit: Payer: Self-pay

## 2019-05-15 DIAGNOSIS — R197 Diarrhea, unspecified: Secondary | ICD-10-CM

## 2019-05-15 DIAGNOSIS — R002 Palpitations: Secondary | ICD-10-CM | POA: Diagnosis not present

## 2019-05-15 DIAGNOSIS — C9000 Multiple myeloma not having achieved remission: Secondary | ICD-10-CM | POA: Diagnosis not present

## 2019-05-15 DIAGNOSIS — G43109 Migraine with aura, not intractable, without status migrainosus: Secondary | ICD-10-CM

## 2019-05-15 MED ORDER — FAMOTIDINE 40 MG/5ML PO SUSR: 40.0000 mg | Freq: Every evening | ORAL | 2 refills | Status: DC | PRN

## 2019-05-15 MED ORDER — TOPIRAMATE 50 MG PO TABS: 50.0000 mg | ORAL_TABLET | Freq: Two times a day (BID) | ORAL | 3 refills | Status: DC

## 2019-05-15 NOTE — Assessment & Plan Note (Signed)
She is encouraged to contact cardiology if symptoms continue and or worsen.

## 2019-05-15 NOTE — Assessment & Plan Note (Signed)
Has been having headaches intermittently but without complaint of visual symptoms. Encouraged increased hydration, 64 ounces of clear fluids daily. Minimize alcohol and caffeine. Eat small frequent meals with lean proteins and complex carbs. Avoid high and low blood sugars. Get adequate sleep, 7-8 hours a night. Needs exercise daily preferably in the morning. She has tested negative for COVID

## 2019-05-15 NOTE — Assessment & Plan Note (Signed)
Resolved, bowels moving normally now.

## 2019-05-15 NOTE — Progress Notes (Signed)
Virtual Visit via phone Note  I connected with Tami [name] on 05/15/19 at  3:20 PM EDT by a video enabled telemedicine application and verified that I am speaking with the correct person using two identifiers.  Location: Patient: home Provider: office   I discussed the limitations of evaluation and management by telemedicine and the availability of in person appointments. The patient expressed understanding and agreed to proceed. Tami [name] CMA was able to get the patient set up on a phone visit after being unable to arrange a video visit.    Subjective:    [name], female    DOB: 1956-09-29, 63 y.o.   MRN: [id number]  No chief complaint on file.   HPI Patient is in today for follow up on chronic medical concerns including hypertension, diarrhea, anemia, headaches, MM and more. She continues to struggle with fatigue and myalgias but this is ongoing. She has recently presented to the ER with a debilitating HA and she did test negative. She notes the NA is gone now. Denies CP/palp/SOB/HA/congestion/fevers/GI or GU c/o. Taking meds as prescribed  Past Medical History:  Diagnosis Date  . Abnormal SPEP 07/26/2016  . Arthritis    knees, hands  . Back pain 07/14/2016  . Bowel obstruction ([place]) 11/2014  . C. difficile diarrhea   . CHF (congestive heart failure) ([place])   . Colitis   . Congestive heart failure ([place]) 02/24/2015   S/p pacemaker  . Depression   . Elevated sed rate 08/15/2017  . Epigastric pain 03/22/2017  . GERD (gastroesophageal reflux disease)   . Hyperlipidemia   . Hypertension   . 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 Low back pain 07/14/2016  . 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 Multiple myeloma ([place]) 07/20/2018  . Multiple myeloma not having achieved remission ([place]) 07/20/2018  . Myalgia 12/31/2016  . Obstructive sleep apnea 03/31/2015  . SOB (shortness of breath) 04/09/2016  . Stroke ([place])    TIAs  . UTI (urinary  tract infection)     Past Surgical History:  Procedure Laterality Date  . ABDOMINAL HYSTERECTOMY     menorraghia, 2006, total  . CHOLECYSTECTOMY    . COLONOSCOPY  2018   [facility] per patient  . ESOPHAGOGASTRODUODENOSCOPY  2018   [facility]   . KNEE SURGERY     right, repair torn torn cartialage  . PACEMAKER INSERTION  08/2014  . TONSILLECTOMY    . TUBAL LIGATION      Family History  Problem Relation Age of Onset  . Diabetes Mother   . Hypertension Mother   . Heart disease Mother        s/p 1 stent  . Hyperlipidemia Mother   . Arthritis Mother   . Cancer Father        COLON  . Colon cancer Father 65  . Irritable bowel syndrome Sister   . Hyperlipidemia Daughter   . Hypertension Daughter   . Hypertension Maternal Grandmother   . Arthritis Maternal Grandmother   . Heart disease Maternal Grandfather        MI  . Hypertension Maternal Grandfather   . Arthritis Maternal Grandfather   . Hypertension Son   . Esophageal cancer Neg Hx     Social History   Socioeconomic History  . Marital status: Divorced    Spouse name: [name]  . Number of children: 3  . Years of education: [name]  . Highest education level: [name]  Occupational History  . [name]  Social Needs  . Financial resource strain: [name]  . Food insecurity    Worry: [name]    Inability: [name]  . Transportation needs    Medical: [name]    Non-medical: [name]  Tobacco Use  . Smoking status: Never Smoker  . Smokeless tobacco: Never Used  Substance and Sexual Activity  . Alcohol use: No    Alcohol/week: 0.0 standard drinks  . Drug use: No  . Sexual activity: [name]  Lifestyle  . Physical activity    Days per week: [name]    Minutes per session: [name]  . Stress: [name]  Relationships  . Social Herbalist on phone: [name]    Gets together: [name]    Attends religious service: Not on  file    Active member of club or organization: [name]    Attends meetings of clubs or organizations: [name]    Relationship status: [name]  . Intimate partner violence    Fear of current or ex partner: [name]    Emotionally abused: [name]    Physically abused: [name]    Forced sexual activity: [name]  Other Topics Concern  . [name]  Social History Narrative  . [name]    Outpatient Medications Prior to Visit  Medication Sig Dispense Refill  . acetaminophen (TYLENOL) 325 MG tablet Take 650 mg by mouth.    Marland Kitchen albuterol (PROVENTIL HFA;VENTOLIN HFA) 108 (90 Base) MCG/ACT inhaler Inhale 2 puffs into the lungs every 6 (six) hours as needed for wheezing or shortness of breath. 1 Inhaler 2  . alendronate (FOSAMAX) 70 MG tablet Take 70 mg by mouth daily.    Marland Kitchen amLODipine (NORVASC) 10 MG tablet Take 1 tablet (10 mg total) by mouth daily. 90 tablet 1  . aspirin EC 81 MG tablet Take 1 tablet (81 mg total) by mouth daily. 90 tablet 1  . carvedilol (COREG) 25 MG tablet Take 1 tablet (25 mg total) by mouth 2 (two) times daily with a meal. 180 tablet 1  . cetirizine (ZYRTEC) 10 MG tablet Take 1 tablet (10 mg total) by mouth every morning. 30 tablet 1  . conjugated estrogens (PREMARIN) vaginal cream Place vaginally.    . cyclobenzaprine (FLEXERIL) 10 MG tablet Take 10 mg by mouth as needed.     Marland Kitchen dexamethasone (DECADRON) 4 MG tablet Take 5 pills at one time with food ONCE a WEEK!! 100 tablet 2  . dicyclomine (BENTYL) 20 MG tablet Take 1 tablet (20 mg total) by mouth 3 (three) times daily as needed for spasms. 90 tablet 1  . erythromycin ophthalmic ointment Place a 1/2 inch ribbon of ointment into the lower eyelid. 1 g 0  . famciclovir (FAMVIR) 250 MG tablet Take 1 tablet (250 mg total) by mouth daily. 30 tablet 12  . famotidine (PEPCID) 20 MG tablet Take 1 tablet (20 mg total) by mouth 2 (two) times daily. 60 tablet 1  . fluticasone (FLONASE) 50 MCG/ACT nasal  spray Place 2 sprays into both nostrils daily. 16 g 1  . HYDROcodone-acetaminophen (NORCO/VICODIN) 5-325 MG tablet Take 1 tablet by mouth every 4 (four) hours as needed for moderate pain or severe pain. 20 tablet 0  . hyoscyamine (LEVSIN SL) 0.125 MG SL tablet  Place 1 tablet (0.125 mg total) under the tongue every 4 (four) hours as needed. 30 tablet 2  . levocetirizine (XYZAL) 5 MG tablet Take 1 tablet (5 mg total) by mouth every evening. 30 tablet 2  . lidocaine (LIDODERM) 5 % Place 1 patch onto the skin daily. Remove & Discard patch within 12 hours or as directed by MD 30 patch 2  . methylPREDNISolone (MEDROL) 4 MG tablet 5 tab po qd X 1d then 4 tab po qd X 1d then 3 tab po qd X 1d then 2 tab po qd then 1 tab po qd 15 tablet 0  . Multiple Vitamins-Minerals (MULTIVITAMIN ADULT PO) Take by mouth.    . mupirocin ointment (BACTROBAN) 2 % Apply 1 application topically 2 (two) times daily. 22 g 0  . ondansetron (ZOFRAN) 4 MG tablet Take 1 tablet (4 mg total) by mouth every 8 (eight) hours as needed for nausea or vomiting. 30 tablet 1  . pantoprazole (PROTONIX) 40 MG tablet Take 1 tablet (40 mg total) by mouth 2 (two) times daily. 180 tablet 1  . pomalidomide (POMALYST) 3 MG capsule Take one capsule daily-Days #1-21. Repeat every 28 days. [id number] 21 capsule 0  . potassium chloride SA (K-DUR,KLOR-CON) 20 MEQ tablet Take 2 tablets (40 mEq total) by mouth once for 1 dose. 60 tablet 3  . prochlorperazine (COMPAZINE) 10 MG tablet Take 1 tablet (10 mg total) by mouth every 6 (six) hours as needed (Nausea or vomiting). 30 tablet 1  . sucralfate (CARAFATE) 1 g tablet Take 1 tablet (1 g total) by mouth 4 (four) times daily -  with meals and at bedtime. 120 tablet 0  . triamterene-hydrochlorothiazide (MAXZIDE-25) 37.5-25 MG tablet Take 1 tablet by mouth daily. 30 tablet 4  . trimethoprim-polymyxin b (POLYTRIM) ophthalmic solution Place 1 drop into both eyes 3 (three) times daily as needed. 10 mL 0  .  ranitidine (ZANTAC) 15 MG/ML syrup Take by mouth.    . topiramate (TOPAMAX) 25 MG tablet Take 1 tablet (25 mg total) by mouth 2 (two) times daily. 180 tablet 0   No facility-administered medications prior to visit.     Allergies  Allergen Reactions  . Benazepril Anaphylaxis and Swelling    angioedema Throat and lip swelling  . Ondansetron Hcl Hives    Redness and hives [name] IV admin on 07/05/17  . Codeine Nausea And Vomiting  . Morphine Hives    Redness and hives noted [name] IV admin on 07/05/17    ROS     Objective:    Physical Exam  There were no vitals taken for this visit. Wt Readings from Last 3 Encounters:  04/21/19 185 lb (83.9 kg)  03/24/19 186 lb (84.4 kg)  03/24/19 186 lb (84.4 kg)    Diabetic Foot Exam - Simple   No data filed     Lab Results  Component Value Date   WBC 3.4 (L) 05/11/2019   HGB 11.6 (L) 05/11/2019   HCT 36.2 05/11/2019   PLT 206 05/11/2019   GLUCOSE 93 05/11/2019   CHOL 137 02/15/2018   TRIG 100.0 02/15/2018   HDL 45.90 02/15/2018   LDLCALC 72 02/15/2018   ALT 14 05/11/2019   AST 16 05/11/2019   NA 141 05/11/2019   K 4.0 05/11/2019   CL 107 05/11/2019   CREATININE 0.65 05/11/2019   BUN 16 05/11/2019   CO2 28 05/11/2019   TSH 1.79 02/15/2018   INR 0.90 07/28/2018    Lab Results  Component Value Date   TSH 1.79 02/15/2018   Lab Results  Component Value Date   WBC 3.4 (L) 05/11/2019   HGB 11.6 (L) 05/11/2019   HCT 36.2 05/11/2019   MCV 92.3 05/11/2019   PLT 206 05/11/2019   Lab Results  Component Value Date   NA 141 05/11/2019   K 4.0 05/11/2019   CHLORIDE 108 12/31/2016   CO2 28 05/11/2019   GLUCOSE 93 05/11/2019   BUN 16 05/11/2019   CREATININE 0.65 05/11/2019   BILITOT 0.6 05/11/2019   ALKPHOS 42 05/11/2019   AST 16 05/11/2019   ALT 14 05/11/2019   PROT 7.7 05/11/2019   ALBUMIN 3.9 05/11/2019   CALCIUM 9.5 05/11/2019   ANIONGAP 6 05/11/2019   EGFR >90 12/31/2016   GFR 93.98 12/02/2018   Lab Results   Component Value Date   CHOL 137 02/15/2018   Lab Results  Component Value Date   HDL 45.90 02/15/2018   Lab Results  Component Value Date   LDLCALC 72 02/15/2018   Lab Results  Component Value Date   TRIG 100.0 02/15/2018   Lab Results  Component Value Date   CHOLHDL 3 02/15/2018   No results found for: HGBA1C     Assessment & Plan:   Problem List Items Addressed This Visit    Multiple myeloma ([place]) (Chronic)    Is following closely with oncology and tolerating treatment but frustrated with her persistent level of fatigue.       Ocular migraine - Primary    Has been having headaches intermittently but without complaint of visual symptoms. Encouraged increased hydration, 64 ounces of clear fluids daily. Minimize alcohol and caffeine. Eat small frequent meals with lean proteins and complex carbs. Avoid high and low blood sugars. Get adequate sleep, 7-8 hours a night. Needs exercise daily preferably in the morning. She has tested negative for COVID      Relevant Medications   topiramate (TOPAMAX) 50 MG tablet   Other Relevant Orders   Ambulatory referral to Neurology   Diarrhea    Resolved, bowels moving normally now.      Palpitation    She is encouraged to contact cardiology if symptoms continue and or worsen.          I have discontinued Jennalynn Rivard topiramate and ranitidine. I am also having her start on topiramate and famotidine. Additionally, I am having her maintain her conjugated estrogens, acetaminophen, carvedilol, pantoprazole, aspirin EC, dicyclomine, ondansetron, lidocaine, levocetirizine, sucralfate, famciclovir, hyoscyamine, dexamethasone, amLODipine, fluticasone, albuterol, cyclobenzaprine, potassium chloride SA, trimethoprim-polymyxin b, mupirocin ointment, Multiple Vitamins-Minerals (MULTIVITAMIN ADULT PO), erythromycin, HYDROcodone-acetaminophen, triamterene-hydrochlorothiazide, methylPREDNISolone, famotidine, cetirizine, prochlorperazine,  alendronate, and pomalidomide.    I discussed the assessment and treatment plan with the patient. The patient was provided an opportunity to ask questions and all were answered. The patient agreed with the plan and demonstrated an understanding of the instructions.   The patient was advised to call back or seek an in-person evaluation if the symptoms worsen or if the condition fails to improve as anticipated.  I provided 25 minutes of non-face-to-face time during this encounter.   Penni Homans, MD

## 2019-05-15 NOTE — Assessment & Plan Note (Signed)
Is following closely with oncology and tolerating treatment but frustrated with her persistent level of fatigue.

## 2019-05-16 DIAGNOSIS — I509 Heart failure, unspecified: Secondary | ICD-10-CM | POA: Diagnosis not present

## 2019-05-16 DIAGNOSIS — I10 Essential (primary) hypertension: Secondary | ICD-10-CM | POA: Diagnosis not present

## 2019-05-16 DIAGNOSIS — G4733 Obstructive sleep apnea (adult) (pediatric): Secondary | ICD-10-CM | POA: Diagnosis not present

## 2019-05-16 DIAGNOSIS — Z950 Presence of cardiac pacemaker: Secondary | ICD-10-CM | POA: Diagnosis not present

## 2019-05-18 DIAGNOSIS — M4312 Spondylolisthesis, cervical region: Secondary | ICD-10-CM | POA: Diagnosis not present

## 2019-05-18 DIAGNOSIS — M542 Cervicalgia: Secondary | ICD-10-CM | POA: Diagnosis not present

## 2019-05-18 DIAGNOSIS — M4802 Spinal stenosis, cervical region: Secondary | ICD-10-CM | POA: Diagnosis not present

## 2019-05-18 DIAGNOSIS — R079 Chest pain, unspecified: Secondary | ICD-10-CM | POA: Diagnosis not present

## 2019-05-19 ENCOUNTER — Inpatient Hospital Stay: Payer: Medicare Other

## 2019-05-19 ENCOUNTER — Encounter: Payer: Self-pay | Admitting: Hematology & Oncology

## 2019-05-19 ENCOUNTER — Inpatient Hospital Stay (HOSPITAL_BASED_OUTPATIENT_CLINIC_OR_DEPARTMENT_OTHER): Payer: Medicare Other | Admitting: Hematology & Oncology

## 2019-05-19 ENCOUNTER — Other Ambulatory Visit: Payer: Self-pay

## 2019-05-19 ENCOUNTER — Other Ambulatory Visit: Payer: Self-pay | Admitting: *Deleted

## 2019-05-19 VITALS — BP 144/88 | HR 86 | Temp 98.6°F | Resp 16 | Wt 186.0 lb

## 2019-05-19 DIAGNOSIS — C9000 Multiple myeloma not having achieved remission: Secondary | ICD-10-CM

## 2019-05-19 DIAGNOSIS — Z5112 Encounter for antineoplastic immunotherapy: Secondary | ICD-10-CM | POA: Diagnosis not present

## 2019-05-19 LAB — CMP (CANCER CENTER ONLY)
ALT: 14 U/L (ref 0–44)
AST: 16 U/L (ref 15–41)
Albumin: 3.8 g/dL (ref 3.5–5.0)
Alkaline Phosphatase: 39 U/L (ref 38–126)
Anion gap: 8 (ref 5–15)
BUN: 15 mg/dL (ref 8–23)
CO2: 26 mmol/L (ref 22–32)
Calcium: 8.5 mg/dL — ABNORMAL LOW (ref 8.9–10.3)
Chloride: 109 mmol/L (ref 98–111)
Creatinine: 0.65 mg/dL (ref 0.44–1.00)
GFR, Est AFR Am: >60 mL/min (ref >60)
GFR, Estimated: >60 mL/min (ref >60)
Glucose, Bld: 84 mg/dL (ref 70–99)
Potassium: 3.4 mmol/L — ABNORMAL LOW (ref 3.5–5.1)
Sodium: 143 mmol/L (ref 135–145)
Total Bilirubin: 0.6 mg/dL (ref 0.3–1.2)
Total Protein: 7.3 g/dL (ref 6.5–8.1)

## 2019-05-19 LAB — CBC WITH DIFFERENTIAL (CANCER CENTER ONLY)
Abs Immature Granulocytes: 0.01 10*3/uL (ref 0.00–0.07)
Basophils Absolute: 0.1 10*3/uL (ref 0.0–0.1)
Basophils Relative: 3 %
Eosinophils Absolute: 0.2 10*3/uL (ref 0.0–0.5)
Eosinophils Relative: 7 %
HCT: 35.4 % — ABNORMAL LOW (ref 36.0–46.0)
Hemoglobin: 11.2 g/dL — ABNORMAL LOW (ref 12.0–15.0)
Immature Granulocytes: 0 %
Lymphocytes Relative: 28 %
Lymphs Abs: 0.6 10*3/uL — ABNORMAL LOW (ref 0.7–4.0)
MCH: 29.5 pg (ref 26.0–34.0)
MCHC: 31.6 g/dL (ref 30.0–36.0)
MCV: 93.2 fL (ref 80.0–100.0)
Monocytes Absolute: 0.2 10*3/uL (ref 0.1–1.0)
Monocytes Relative: 8 %
Neutro Abs: 1.3 10*3/uL — ABNORMAL LOW (ref 1.7–7.7)
Neutrophils Relative %: 54 %
Platelet Count: 116 10*3/uL — ABNORMAL LOW (ref 150–400)
RBC: 3.8 MIL/uL — ABNORMAL LOW (ref 3.87–5.11)
RDW: 15.1 % (ref 11.5–15.5)
WBC Count: 2.3 10*3/uL — ABNORMAL LOW (ref 4.0–10.5)
nRBC: 0 % (ref 0.0–0.2)

## 2019-05-19 MED ORDER — BORTEZOMIB CHEMO SQ INJECTION 3.5 MG (2.5MG/ML)
1.3000 mg/m2 | Freq: Once | INTRAMUSCULAR | Status: AC
  Administered 2019-05-19: 11:00:00 2.5 mg via SUBCUTANEOUS
  Filled 2019-05-19: qty 1

## 2019-05-19 MED ORDER — PROCHLORPERAZINE MALEATE 10 MG PO TABS: 10.0000 mg | ORAL_TABLET | Freq: Once | ORAL | Status: DC

## 2019-05-19 MED ORDER — POMALIDOMIDE 3 MG PO CAPS: ORAL_CAPSULE | ORAL | 0 refills | Status: DC

## 2019-05-19 NOTE — Patient Instructions (Addendum)
[facility] Discharge Instructions for Patients Receiving Chemotherapy  Today you received the following chemotherapy agents Velcade To help prevent nausea and vomiting after your treatment, we encourage you to take your nausea medication as prescribed.   If you develop nausea and vomiting that is not controlled by your nausea medication, call the clinic.   BELOW ARE SYMPTOMS THAT SHOULD BE REPORTED IMMEDIATELY:  *FEVER GREATER THAN 100.5 F  *CHILLS WITH OR WITHOUT FEVER  NAUSEA AND VOMITING THAT IS NOT CONTROLLED WITH YOUR NAUSEA MEDICATION  *UNUSUAL SHORTNESS OF BREATH  *UNUSUAL BRUISING OR BLEEDING  TENDERNESS IN MOUTH AND THROAT WITH OR WITHOUT PRESENCE OF ULCERS  *URINARY PROBLEMS  *BOWEL PROBLEMS  UNUSUAL RASH Items with * indicate a potential emergency and should be followed up as soon as possible.  Feel free to call the clinic should you have any questions or concerns. The clinic phone number is [phone].  Please show the [place] [facility] at check-in to the Emergency Department and triage nurse.  Bortezomib injection What is this medicine? BORTEZOMIB (bor TEZ oh mib) is a medicine that targets proteins in cancer cells and stops the cancer cells from growing. It is used to treat multiple myeloma and mantle-cell lymphoma. This medicine may be used for other purposes; ask your health care provider or pharmacist if you have questions. COMMON BRAND NAME(S): Velcade What should I tell my health care provider before I take this medicine? They need to know if you have any of these conditions:  diabetes  heart disease  irregular heartbeat  liver disease  on hemodialysis  low blood counts, like low white blood cells, platelets, or hemoglobin  peripheral neuropathy  taking medicine for blood pressure  an unusual or allergic reaction to bortezomib, mannitol, boron, other medicines, foods, dyes, or preservatives  pregnant or trying to get  pregnant  breast-feeding How should I use this medicine? This medicine is for injection into a vein or for injection under the skin. It is given by a health care professional in a hospital or clinic setting. Talk to your pediatrician regarding the use of this medicine in children. Special care may be needed. Overdosage: If you think you have taken too much of this medicine contact a poison control center or emergency room at once. NOTE: This medicine is only for you. Do not share this medicine with others. What if I miss a dose? It is important not to miss your dose. Call your doctor or health care professional if you are unable to keep an appointment. What may interact with this medicine? This medicine may interact with the following medications:  ketoconazole  rifampin  ritonavir  St. John's Wort This list may not describe all possible interactions. Give your health care provider a list of all the medicines, herbs, non-prescription drugs, or dietary supplements you use. Also tell them if you smoke, drink alcohol, or use illegal drugs. Some items may interact with your medicine. What should I watch for while using this medicine? You may get drowsy or dizzy. Do not drive, use machinery, or do anything that needs mental alertness until you know how this medicine affects you. Do not stand or sit up quickly, especially if you are an older patient. This reduces the risk of dizzy or fainting spells. In some cases, you may be given additional medicines to help with side effects. Follow all directions for their use. Call your doctor or health care professional for advice if you get a fever, chills  or sore throat, or other symptoms of a cold or flu. Do not treat yourself. This drug decreases your body's ability to fight infections. Try to avoid being around people who are sick. This medicine may increase your risk to bruise or bleed. Call your doctor or health care professional if you notice any  unusual bleeding. You may need blood work done while you are taking this medicine. In some patients, this medicine may cause a serious brain infection that may cause death. If you have any problems seeing, thinking, speaking, walking, or standing, tell your doctor right away. If you cannot reach your doctor, urgently seek other source of medical care. Check with your doctor or health care professional if you get an attack of severe diarrhea, nausea and vomiting, or if you sweat a lot. The loss of too much body fluid can make it dangerous for you to take this medicine. Do not become pregnant while taking this medicine or for at least 7 months after stopping it. Women should inform their doctor if they wish to become pregnant or think they might be pregnant. Men should not father a child while taking this medicine and for at least 4 months after stopping it. There is a potential for serious side effects to an unborn child. Talk to your health care professional or pharmacist for more information. Do not breast-feed an infant while taking this medicine or for 2 months after stopping it. This medicine may interfere with the ability to have a child. You should talk with your doctor or health care professional if you are concerned about your fertility. What side effects may I notice from receiving this medicine? Side effects that you should report to your doctor or health care professional as soon as possible:  allergic reactions like skin rash, itching or hives, swelling of the face, lips, or tongue  breathing problems  changes in hearing  changes in vision  fast, irregular heartbeat  feeling faint or lightheaded, falls  pain, tingling, numbness in the hands or feet  right upper belly pain  seizures  swelling of the ankles, feet, hands  unusual bleeding or bruising  unusually weak or tired  vomiting  yellowing of the eyes or skin Side effects that usually do not require medical  attention (report to your doctor or health care professional if they continue or are bothersome):  changes in emotions or moods  constipation  diarrhea  loss of appetite  headache  irritation at site where injected  nausea This list may not describe all possible side effects. Call your doctor for medical advice about side effects. You may report side effects to FDA at 1-800-FDA-1088. Where should I keep my medicine? This drug is given in a hospital or clinic and will not be stored at home. NOTE: This sheet is a summary. It may not cover all possible information. If you have questions about this medicine, talk to your doctor, pharmacist, or health care provider.  2020 Elsevier/Gold Standard (2018-03-07 16:29:31)

## 2019-05-19 NOTE — Progress Notes (Signed)
.  10:19 AM OK to treat with ANC of 1.3 per Dr. Marin Olp.

## 2019-05-19 NOTE — Progress Notes (Signed)
Hematology and Oncology Follow Up Visit  Tami [name] [id number] 1956-09-07 63 y.o. 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  So far, things are going pretty well for Tami [name].  Tami [name] seems to be doing okay with the Velcade/pomalidomide combination.  Tami [name] last myeloma studies that we did about a month ago showed an M spike down to 0.6 g/dL.  Tami [name] IgG level was 2072 mg/dL.  Tami [name] kappa light chain was 5.9 mg/dL.  Tami [name] has had no problems with Tami [name] bowels or bladder.  There is been no diarrhea.  Tami [name] has had no nausea or vomiting.  There is been no cough or shortness of breath.  Tami [name] has had no leg swelling.  Overall, Tami [name] performance status is ECOG 1.  Medications:  Allergies as of 05/19/2019      Reactions   Benazepril Anaphylaxis, Swelling   angioedema Throat and lip swelling   Ondansetron Hcl Hives   Redness and hives post IV admin on 07/05/17   Codeine Nausea And Vomiting   Morphine Hives   Redness and hives noted post IV admin on 07/05/17      Medication List       Accurate as of May 19, 2019  9:53 AM. If you have any questions, ask your nurse or doctor.        STOP taking these medications   methylPREDNISolone 4 MG tablet Commonly known as: Medrol Stopped by: [name], MD     TAKE these medications   acetaminophen 325 MG tablet Commonly known as: TYLENOL Take 650 mg by mouth.   albuterol 108 (90 Base) MCG/ACT inhaler Commonly known as: VENTOLIN HFA Inhale 2 puffs into the lungs every 6 (six) hours as needed for wheezing or shortness of breath.   alendronate 70 MG tablet Commonly known as: FOSAMAX Take 70 mg by mouth daily.   amLODipine 10 MG tablet Commonly known as: NORVASC Take 1 tablet (10 mg total) by mouth  daily.   aspirin EC 81 MG tablet Take 1 tablet (81 mg total) by mouth daily.   carvedilol 25 MG tablet Commonly known as: COREG Take 1 tablet (25 mg total) by mouth 2 (two) times daily with a meal.   cetirizine 10 MG tablet Commonly known as: ZYRTEC Take 1 tablet (10 mg total) by mouth every morning.   cyclobenzaprine 10 MG tablet Commonly known as: FLEXERIL Take 10 mg by mouth as needed.   dexamethasone 4 MG tablet Commonly known as: DECADRON Take 5 pills at one time with food ONCE a WEEK!!   dicyclomine 20 MG tablet Commonly known as: Bentyl Take 1 tablet (20 mg total) by mouth 3 (three) times daily as needed for spasms.   erythromycin ophthalmic ointment Place a 1/2 inch ribbon of ointment into the lower eyelid.   famciclovir 250 MG tablet Commonly known as: FAMVIR Take 1 tablet (250 mg total) by mouth daily.   famotidine 20 MG tablet Commonly known as: Pepcid Take 1 tablet (20 mg total) by mouth 2 (two) times daily.   famotidine 40 MG/5ML suspension Commonly known as: PEPCID Take 5 mLs (40 mg total) by mouth at bedtime as needed for heartburn or indigestion.   fluticasone 50 MCG/ACT nasal spray Commonly known as: [place]  2 sprays into both nostrils daily.   HYDROcodone-acetaminophen 5-325 MG tablet Commonly known as: NORCO/VICODIN Take 1 tablet by mouth every 4 (four) hours as needed for moderate pain or severe pain.   hyoscyamine 0.125 MG SL tablet Commonly known as: LEVSIN SL Place 1 tablet (0.125 mg total) under the tongue every 4 (four) hours as needed.   levocetirizine 5 MG tablet Commonly known as: XYZAL Take 1 tablet (5 mg total) by mouth every evening.   lidocaine 5 % Commonly known as: Lidoderm Place 1 patch onto the skin daily. Remove & Discard patch within 12 hours or as directed by MD   MULTIVITAMIN ADULT PO Take by mouth.   mupirocin ointment 2 % Commonly known as: Bactroban Apply 1 application topically 2 (two) times daily.    ondansetron 4 MG tablet Commonly known as: Zofran Take 1 tablet (4 mg total) by mouth every 8 (eight) hours as needed for nausea or vomiting.   pantoprazole 40 MG tablet Commonly known as: PROTONIX Take 1 tablet (40 mg total) by mouth 2 (two) times daily.   pomalidomide 3 MG capsule Commonly known as: POMALYST Take one capsule daily-Days #1-21. Repeat every 28 days. [id number]   potassium chloride SA 20 MEQ tablet Commonly known as: K-DUR Take 2 tablets (40 mEq total) by mouth once for 1 dose.   Premarin vaginal cream Generic drug: conjugated estrogens Place vaginally.   prochlorperazine 10 MG tablet Commonly known as: COMPAZINE Take 1 tablet (10 mg total) by mouth every 6 (six) hours as needed (Nausea or vomiting).   sucralfate 1 g tablet Commonly known as: Carafate Take 1 tablet (1 g total) by mouth 4 (four) times daily -  with meals and at bedtime.   topiramate 50 MG tablet Commonly known as: Topamax Take 1 tablet (50 mg total) by mouth 2 (two) times daily.   triamterene-hydrochlorothiazide 37.5-25 MG tablet Commonly known as: Maxzide-25 Take 1 tablet by mouth daily.   trimethoprim-polymyxin b ophthalmic solution Commonly known as: Polytrim Place 1 drop into both eyes 3 (three) times daily as needed.       Allergies:  Allergies  Allergen Reactions  . Benazepril Anaphylaxis and Swelling    angioedema Throat and lip swelling  . Ondansetron Hcl Hives    Redness and hives post IV admin on 07/05/17  . Codeine Nausea And Vomiting  . Morphine Hives    Redness and hives noted post IV admin on 07/05/17    Past Medical History, Surgical history, Social history, and Family History were reviewed and updated.  Review of Systems: Review of Systems  Constitutional: Negative.   HENT: Negative.   Eyes: Negative.   Respiratory: Negative.   Cardiovascular: Negative.   Gastrointestinal: Negative.   Genitourinary: Negative.   Musculoskeletal: Negative.   Skin:  Positive for rash.  Neurological: Negative.   Endo/Heme/Allergies: Negative.   Psychiatric/Behavioral: Negative.      Physical Exam:  weight is 186 lb (84.4 kg). Tami [name] oral temperature is 98.6 F (37 C). Tami [name] blood pressure is 144/88 (abnormal) and Tami [name] pulse is 86. Tami [name] respiration is 16 and oxygen saturation is 99%.   Wt Readings from Last 3 Encounters:  05/19/19 186 lb (84.4 kg)  04/21/19 185 lb (83.9 kg)  03/24/19 186 lb (84.4 kg)    Physical Exam Vitals signs reviewed.  HENT:     Head: Normocephalic and atraumatic.  Eyes:     Pupils: Pupils are equal, round, and reactive to light.  Neck:  Musculoskeletal: Normal range of motion.  Cardiovascular:     Rate and Rhythm: Normal rate and regular rhythm.     Heart sounds: Normal heart sounds.  Pulmonary:     Effort: Pulmonary effort is normal.     Breath sounds: Normal breath sounds.  Abdominal:     General: Bowel sounds are normal.     Palpations: Abdomen is soft.  Musculoskeletal: Normal range of motion.        General: No tenderness or deformity.  Lymphadenopathy:     Cervical: No cervical adenopathy.  Skin:    General: Skin is warm and dry.     Findings: No erythema or rash.  Neurological:     Mental Status: Tami [name] is alert and oriented to person, place, and time.  Psychiatric:        Behavior: Behavior normal.        Thought Content: Thought content normal.        Judgment: Judgment normal.      Lab Results  Component Value Date   WBC 2.3 (L) 05/19/2019   HGB 11.2 (L) 05/19/2019   HCT 35.4 (L) 05/19/2019   MCV 93.2 05/19/2019   PLT 116 (L) 05/19/2019   Lab Results  Component Value Date   FERRITIN 169 09/16/2016   IRON 59 09/16/2016   TIBC 281 09/16/2016   UIBC 222 09/16/2016   IRONPCTSAT 21 09/16/2016   Lab Results  Component Value Date   RETICCTPCT 0.8 07/16/2016   RBC 3.80 (L) 05/19/2019   RETICCTABS 32,640 07/16/2016   Lab Results  Component Value Date   KPAFRELGTCHN 59.1 (H) 04/21/2019    LAMBDASER 11.7 04/21/2019   KAPLAMBRATIO 5.05 (H) 04/21/2019   Lab Results  Component Value Date   IGGSERUM 2,072 (H) 04/21/2019   IGA 204 04/21/2019   IGMSERUM 92 04/21/2019   Lab Results  Component Value Date   TOTALPROTELP 7.2 04/21/2019   ALBUMINELP 3.3 04/21/2019   A1GS 0.2 04/21/2019   A2GS 1.0 04/21/2019   BETS 0.9 04/21/2019   BETA2SER 0.5 07/16/2016   GAMS 1.8 04/21/2019   MSPIKE 0.6 (H) 04/21/2019   SPEI Comment 10/07/2018     Chemistry      Component Value Date/Time   NA 141 05/11/2019 0932   NA 143 11/08/2017 1127   NA 140 12/31/2016 1000   K 4.0 05/11/2019 0932   K 4.0 11/08/2017 1127   K 3.6 12/31/2016 1000   CL 107 05/11/2019 0932   CL 107 11/08/2017 1127   CO2 28 05/11/2019 0932   CO2 25 11/08/2017 1127   CO2 24 12/31/2016 1000   BUN 16 05/11/2019 0932   BUN 19 11/08/2017 1127   BUN 22.3 12/31/2016 1000   CREATININE 0.65 05/11/2019 0932   CREATININE 0.9 11/08/2017 1127   CREATININE 0.8 12/31/2016 1000      Component Value Date/Time   CALCIUM 9.5 05/11/2019 0932   CALCIUM 9.9 11/08/2017 1127   CALCIUM 9.5 12/31/2016 1000   ALKPHOS 42 05/11/2019 0932   ALKPHOS 44 11/08/2017 1127   ALKPHOS 51 12/31/2016 1000   AST 16 05/11/2019 0932   AST 28 12/31/2016 1000   ALT 14 05/11/2019 0932   ALT 36 11/08/2017 1127   ALT 16 12/31/2016 1000   BILITOT 0.6 05/11/2019 0932   BILITOT 0.46 12/31/2016 1000      Impression and Plan: Tami [name] is a very pleasant 63 yo African American female with a previous IgG kappa MGUS with progression to myeloma.  Tami [name] is responding nicely.  Hopefully, Tami [name] M spike will be down even further.  Tami [name] quality of life is doing pretty well right now.  I will plan to have Tami [name] come back in 4 weeks.  We will go ahead and give Tami [name] a 2-week break in between cycles.  I think this will help Tami [name] blood counts.    [name], MD 7/10/20209:53 AM

## 2019-05-20 LAB — IGG, IGA, IGM
IgA: 192 mg/dL (ref 87–352)
IgG (Immunoglobin G), Serum: 1923 mg/dL — ABNORMAL HIGH (ref 586–1602)
IgM (Immunoglobulin M), Srm: 87 mg/dL (ref 26–217)

## 2019-05-22 LAB — KAPPA/LAMBDA LIGHT CHAINS
Kappa free light chain: 48.7 mg/L — ABNORMAL HIGH (ref 3.3–19.4)
Kappa, lambda light chain ratio: 4.16 — ABNORMAL HIGH (ref 0.26–1.65)
Lambda free light chains: 11.7 mg/L (ref 5.7–26.3)

## 2019-05-23 DIAGNOSIS — J40 Bronchitis, not specified as acute or chronic: Secondary | ICD-10-CM | POA: Diagnosis not present

## 2019-05-24 LAB — PROTEIN ELECTROPHORESIS, SERUM, WITH REFLEX
A/G Ratio: 0.9 (ref 0.7–1.7)
Albumin ELP: 3.3 g/dL (ref 2.9–4.4)
Alpha-1-Globulin: 0.2 g/dL (ref 0.0–0.4)
Alpha-2-Globulin: 0.9 g/dL (ref 0.4–1.0)
Beta Globulin: 0.9 g/dL (ref 0.7–1.3)
Gamma Globulin: 1.6 g/dL (ref 0.4–1.8)
Globulin, Total: 3.7 g/dL (ref 2.2–3.9)
M-Spike, %: 0.7 g/dL — ABNORMAL HIGH
SPEP Interpretation: 0
Total Protein ELP: 7 g/dL (ref 6.0–8.5)

## 2019-05-24 LAB — IMMUNOFIXATION REFLEX, SERUM
IgA: 233 mg/dL (ref 87–352)
IgG (Immunoglobin G), Serum: 2290 mg/dL — ABNORMAL HIGH (ref 586–1602)
IgM (Immunoglobulin M), Srm: 99 mg/dL (ref 26–217)

## 2019-05-26 ENCOUNTER — Inpatient Hospital Stay: Payer: Medicare Other

## 2019-05-26 ENCOUNTER — Other Ambulatory Visit: Payer: Self-pay

## 2019-05-26 VITALS — BP 150/83 | HR 86 | Temp 97.5°F | Resp 20

## 2019-05-26 DIAGNOSIS — Z5112 Encounter for antineoplastic immunotherapy: Secondary | ICD-10-CM | POA: Diagnosis not present

## 2019-05-26 DIAGNOSIS — C9000 Multiple myeloma not having achieved remission: Secondary | ICD-10-CM | POA: Diagnosis not present

## 2019-05-26 LAB — CBC WITH DIFFERENTIAL (CANCER CENTER ONLY)
Abs Immature Granulocytes: 0.01 10*3/uL (ref 0.00–0.07)
Basophils Absolute: 0.1 10*3/uL (ref 0.0–0.1)
Basophils Relative: 2 %
Eosinophils Absolute: 0.1 10*3/uL (ref 0.0–0.5)
Eosinophils Relative: 5 %
HCT: 37.2 % (ref 36.0–46.0)
Hemoglobin: 12 g/dL (ref 12.0–15.0)
Immature Granulocytes: 0 %
Lymphocytes Relative: 41 %
Lymphs Abs: 1 10*3/uL (ref 0.7–4.0)
MCH: 29.8 pg (ref 26.0–34.0)
MCHC: 32.3 g/dL (ref 30.0–36.0)
MCV: 92.3 fL (ref 80.0–100.0)
Monocytes Absolute: 0.4 10*3/uL (ref 0.1–1.0)
Monocytes Relative: 17 %
Neutro Abs: 0.9 10*3/uL — ABNORMAL LOW (ref 1.7–7.7)
Neutrophils Relative %: 35 %
Platelet Count: 109 10*3/uL — ABNORMAL LOW (ref 150–400)
RBC: 4.03 MIL/uL (ref 3.87–5.11)
RDW: 14.6 % (ref 11.5–15.5)
WBC Count: 2.5 10*3/uL — ABNORMAL LOW (ref 4.0–10.5)
nRBC: 0 % (ref 0.0–0.2)

## 2019-05-26 LAB — CMP (CANCER CENTER ONLY)
ALT: 12 U/L (ref 0–44)
AST: 15 U/L (ref 15–41)
Albumin: 3.8 g/dL (ref 3.5–5.0)
Alkaline Phosphatase: 41 U/L (ref 38–126)
Anion gap: 8 (ref 5–15)
BUN: 15 mg/dL (ref 8–23)
CO2: 28 mmol/L (ref 22–32)
Calcium: 8.6 mg/dL — ABNORMAL LOW (ref 8.9–10.3)
Chloride: 107 mmol/L (ref 98–111)
Creatinine: 0.71 mg/dL (ref 0.44–1.00)
GFR, Est AFR Am: >60 mL/min (ref >60)
GFR, Estimated: >60 mL/min (ref >60)
Glucose, Bld: 77 mg/dL (ref 70–99)
Potassium: 3.7 mmol/L (ref 3.5–5.1)
Sodium: 143 mmol/L (ref 135–145)
Total Bilirubin: 0.5 mg/dL (ref 0.3–1.2)
Total Protein: 7.4 g/dL (ref 6.5–8.1)

## 2019-05-26 MED ORDER — PROCHLORPERAZINE MALEATE 10 MG PO TABS: 10.0000 mg | ORAL_TABLET | Freq: Once | ORAL | Status: DC

## 2019-05-26 MED ORDER — BORTEZOMIB CHEMO SQ INJECTION 3.5 MG (2.5MG/ML)
1.3000 mg/m2 | Freq: Once | INTRAMUSCULAR | Status: AC
  Administered 2019-05-26: 11:00:00 2.5 mg via SUBCUTANEOUS
  Filled 2019-05-26: qty 1

## 2019-05-26 NOTE — Progress Notes (Signed)
OK to treat with today's labs per Dr. Marin Olp.

## 2019-05-26 NOTE — Patient Instructions (Signed)
[facility] Discharge Instructions for Patients Receiving Chemotherapy  Today you received the following chemotherapy agents:  Velcade  To help prevent nausea and vomiting after your treatment, we encourage you to take your nausea medication as ordered per MD.    If you develop nausea and vomiting that is not controlled by your nausea medication, call the clinic.   BELOW ARE SYMPTOMS THAT SHOULD BE REPORTED IMMEDIATELY:  *FEVER GREATER THAN 100.5 F  *CHILLS WITH OR WITHOUT FEVER  NAUSEA AND VOMITING THAT IS NOT CONTROLLED WITH YOUR NAUSEA MEDICATION  *UNUSUAL SHORTNESS OF BREATH  *UNUSUAL BRUISING OR BLEEDING  TENDERNESS IN MOUTH AND THROAT WITH OR WITHOUT PRESENCE OF ULCERS  *URINARY PROBLEMS  *BOWEL PROBLEMS  UNUSUAL RASH Items with * indicate a potential emergency and should be followed up as soon as possible.  Feel free to call the clinic should you have any questions or concerns. The clinic phone number is [phone].  Please show the CHEMO ALERT CARD at check-in to the Emergency Department and triage nurse.   

## 2019-06-06 DIAGNOSIS — R0602 Shortness of breath: Secondary | ICD-10-CM | POA: Diagnosis not present

## 2019-06-06 DIAGNOSIS — N390 Urinary tract infection, site not specified: Secondary | ICD-10-CM | POA: Diagnosis not present

## 2019-06-06 DIAGNOSIS — I509 Heart failure, unspecified: Secondary | ICD-10-CM | POA: Diagnosis not present

## 2019-06-06 DIAGNOSIS — R300 Dysuria: Secondary | ICD-10-CM | POA: Diagnosis not present

## 2019-06-09 DIAGNOSIS — R0602 Shortness of breath: Secondary | ICD-10-CM | POA: Diagnosis not present

## 2019-06-09 DIAGNOSIS — I509 Heart failure, unspecified: Secondary | ICD-10-CM | POA: Diagnosis not present

## 2019-06-14 DIAGNOSIS — Z95810 Presence of automatic (implantable) cardiac defibrillator: Secondary | ICD-10-CM | POA: Diagnosis not present

## 2019-06-16 ENCOUNTER — Other Ambulatory Visit: Payer: Self-pay

## 2019-06-16 ENCOUNTER — Inpatient Hospital Stay: Payer: Medicare Other | Attending: Hematology & Oncology | Admitting: Family

## 2019-06-16 ENCOUNTER — Inpatient Hospital Stay: Payer: Medicare Other

## 2019-06-16 ENCOUNTER — Other Ambulatory Visit: Payer: Self-pay | Admitting: *Deleted

## 2019-06-16 DIAGNOSIS — I6350 Cerebral infarction due to unspecified occlusion or stenosis of unspecified cerebral artery: Secondary | ICD-10-CM | POA: Diagnosis not present

## 2019-06-16 DIAGNOSIS — C9000 Multiple myeloma not having achieved remission: Secondary | ICD-10-CM

## 2019-06-16 DIAGNOSIS — I509 Heart failure, unspecified: Secondary | ICD-10-CM | POA: Diagnosis not present

## 2019-06-16 DIAGNOSIS — R0602 Shortness of breath: Secondary | ICD-10-CM | POA: Diagnosis not present

## 2019-06-16 DIAGNOSIS — R0902 Hypoxemia: Secondary | ICD-10-CM | POA: Diagnosis not present

## 2019-06-16 DIAGNOSIS — G4733 Obstructive sleep apnea (adult) (pediatric): Secondary | ICD-10-CM | POA: Diagnosis not present

## 2019-06-16 DIAGNOSIS — Z5112 Encounter for antineoplastic immunotherapy: Secondary | ICD-10-CM | POA: Diagnosis not present

## 2019-06-16 DIAGNOSIS — I428 Other cardiomyopathies: Secondary | ICD-10-CM | POA: Diagnosis not present

## 2019-06-16 LAB — CMP (CANCER CENTER ONLY)
ALT: 13 U/L (ref 0–44)
AST: 15 U/L (ref 15–41)
Albumin: 3.8 g/dL (ref 3.5–5.0)
Alkaline Phosphatase: 41 U/L (ref 38–126)
Anion gap: 8 (ref 5–15)
BUN: 17 mg/dL (ref 8–23)
CO2: 26 mmol/L (ref 22–32)
Calcium: 8.6 mg/dL — ABNORMAL LOW (ref 8.9–10.3)
Chloride: 108 mmol/L (ref 98–111)
Creatinine: 0.65 mg/dL (ref 0.44–1.00)
GFR, Est AFR Am: >60 mL/min (ref >60)
GFR, Estimated: >60 mL/min (ref >60)
Glucose, Bld: 107 mg/dL — ABNORMAL HIGH (ref 70–99)
Potassium: 3.8 mmol/L (ref 3.5–5.1)
Sodium: 142 mmol/L (ref 135–145)
Total Bilirubin: 0.6 mg/dL (ref 0.3–1.2)
Total Protein: 6.9 g/dL (ref 6.5–8.1)

## 2019-06-16 LAB — CBC WITH DIFFERENTIAL (CANCER CENTER ONLY)
Abs Immature Granulocytes: 0 10*3/uL (ref 0.00–0.07)
Basophils Absolute: 0.1 10*3/uL (ref 0.0–0.1)
Basophils Relative: 4 %
Eosinophils Absolute: 0.1 10*3/uL (ref 0.0–0.5)
Eosinophils Relative: 3 %
HCT: 36.5 % (ref 36.0–46.0)
Hemoglobin: 11.5 g/dL — ABNORMAL LOW (ref 12.0–15.0)
Immature Granulocytes: 0 %
Lymphocytes Relative: 40 %
Lymphs Abs: 1.1 10*3/uL (ref 0.7–4.0)
MCH: 29.5 pg (ref 26.0–34.0)
MCHC: 31.5 g/dL (ref 30.0–36.0)
MCV: 93.6 fL (ref 80.0–100.0)
Monocytes Absolute: 0.3 10*3/uL (ref 0.1–1.0)
Monocytes Relative: 10 %
Neutro Abs: 1.2 10*3/uL — ABNORMAL LOW (ref 1.7–7.7)
Neutrophils Relative %: 43 %
Platelet Count: 143 10*3/uL — ABNORMAL LOW (ref 150–400)
RBC: 3.9 MIL/uL (ref 3.87–5.11)
RDW: 14.9 % (ref 11.5–15.5)
WBC Count: 2.8 10*3/uL — ABNORMAL LOW (ref 4.0–10.5)
nRBC: 0 % (ref 0.0–0.2)

## 2019-06-16 MED ORDER — POMALIDOMIDE 2 MG PO CAPS: ORAL_CAPSULE | ORAL | 0 refills | Status: DC

## 2019-06-16 MED ORDER — PROCHLORPERAZINE MALEATE 10 MG PO TABS: 10.0000 mg | ORAL_TABLET | Freq: Once | ORAL | Status: DC

## 2019-06-16 MED ORDER — BORTEZOMIB CHEMO SQ INJECTION 3.5 MG (2.5MG/ML)
1.3000 mg/m2 | Freq: Once | INTRAMUSCULAR | Status: AC
  Administered 2019-06-16: 11:00:00 2.5 mg via SUBCUTANEOUS
  Filled 2019-06-16: qty 1

## 2019-06-16 NOTE — Progress Notes (Signed)
Hematology and Oncology Follow Up Visit  Tami [name] [id number] [birth year] 63 y.o. 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 Tami [name] is symptomatic with fatigue, weakness, SOB with any exertion, dizziness, lower back pain and numbness in her feet. Tami [name] had read on her Pomalyst sheet that these are all potential side effects.  M-spike last month was 0.7, IgG level 1,923 and kappa light chains 4.87 mg/dL Tami [name] has had puffiness off and on in her ankles. Her PCP felt this may be related to her Norvasc.  No swelling or tenderness in her extremities at this time.  No lymphadenopathy noted on exam.  No fever, chills, n/v, cough, rash, dizziness, SOB, chest pain, palpitations, abdominal pain or changes in bowel habits.  Tami [name] went to see her PCP and stated that Tami [name] was not urinating as often. Her kidney function testing is fine today and Tami [name] states her UA was negative.  Tami [name] has maintained a good appetite and is staying hydrated. Her weight is stable.   ECOG Performance Status: 1 - Symptomatic but completely ambulatory  Medications:  Allergies as of 06/16/2019      Reactions   Benazepril Anaphylaxis, Swelling   angioedema Throat and lip swelling   Ondansetron Hcl Hives   Redness and hives post IV admin on 07/05/17   Codeine Nausea And Vomiting   Morphine Hives   Redness and hives noted post IV admin on 07/05/17      Medication List       Accurate as of June 16, 2019 10:27 AM. If you have any questions, ask your nurse or doctor.        acetaminophen 325 MG tablet Commonly known as: TYLENOL Take 650 mg by mouth.   albuterol 108 (90 Base) MCG/ACT inhaler Commonly known as: VENTOLIN HFA Inhale 2 puffs into the lungs  every 6 (six) hours as needed for wheezing or shortness of breath.   alendronate 70 MG tablet Commonly known as: FOSAMAX Take 70 mg by mouth daily.   amLODipine 10 MG tablet Commonly known as: NORVASC Take 1 tablet (10 mg total) by mouth daily.   aspirin EC 81 MG tablet Take 1 tablet (81 mg total) by mouth daily.   carvedilol 25 MG tablet Commonly known as: COREG Take 1 tablet (25 mg total) by mouth 2 (two) times daily with a meal.   cetirizine 10 MG tablet Commonly known as: ZYRTEC Take 1 tablet (10 mg total) by mouth every morning.   cyclobenzaprine 10 MG tablet Commonly known as: FLEXERIL Take 10 mg by mouth as needed.   dexamethasone 4 MG tablet Commonly known as: DECADRON Take 5 pills at one time with food ONCE a WEEK!!   dicyclomine 20 MG tablet Commonly known as: Bentyl Take 1 tablet (20 mg total) by mouth 3 (three) times daily as needed for spasms.   erythromycin ophthalmic ointment Place a 1/2 inch ribbon of ointment into the lower eyelid.   famciclovir 250 MG tablet Commonly known as: FAMVIR Take 1 tablet (250 mg total) by mouth daily.   famotidine 20 MG tablet Commonly known as: Pepcid Take 1 tablet (20 mg total) by mouth 2 (two) times daily.   famotidine 40 MG/5ML suspension Commonly known as:  PEPCID Take 5 mLs (40 mg total) by mouth at bedtime as needed for heartburn or indigestion.   fluticasone 50 MCG/ACT nasal spray Commonly known as: FLONASE Place 2 sprays into both nostrils daily.   HYDROcodone-acetaminophen 5-325 MG tablet Commonly known as: NORCO/VICODIN Take 1 tablet by mouth every 4 (four) hours as needed for moderate pain or severe pain.   hyoscyamine 0.125 MG SL tablet Commonly known as: LEVSIN SL Place 1 tablet (0.125 mg total) under the tongue every 4 (four) hours as needed.   levocetirizine 5 MG tablet Commonly known as: XYZAL Take 1 tablet (5 mg total) by mouth every evening.   lidocaine 5 % Commonly known as: Lidoderm  Place 1 patch onto the skin daily. Remove & Discard patch within 12 hours or as directed by MD   MULTIVITAMIN ADULT PO Take by mouth.   mupirocin ointment 2 % Commonly known as: Bactroban Apply 1 application topically 2 (two) times daily.   ondansetron 4 MG tablet Commonly known as: Zofran Take 1 tablet (4 mg total) by mouth every 8 (eight) hours as needed for nausea or vomiting.   pantoprazole 40 MG tablet Commonly known as: PROTONIX Take 1 tablet (40 mg total) by mouth 2 (two) times daily.   pomalidomide 2 MG capsule Commonly known as: POMALYST Take one capsule daily-Days #1-21. Repeat every 28 days. [id number] What changed: medication strength Changed by: Laverna Peace, NP   potassium chloride SA 20 MEQ tablet Commonly known as: K-DUR Take 2 tablets (40 mEq total) by mouth once for 1 dose.   Premarin vaginal cream Generic drug: conjugated estrogens Place vaginally.   prochlorperazine 10 MG tablet Commonly known as: COMPAZINE Take 1 tablet (10 mg total) by mouth every 6 (six) hours as needed (Nausea or vomiting).   sucralfate 1 g tablet Commonly known as: Carafate Take 1 tablet (1 g total) by mouth 4 (four) times daily -  with meals and at bedtime.   topiramate 50 MG tablet Commonly known as: Topamax Take 1 tablet (50 mg total) by mouth 2 (two) times daily.   triamterene-hydrochlorothiazide 37.5-25 MG tablet Commonly known as: Maxzide-25 Take 1 tablet by mouth daily.   trimethoprim-polymyxin b ophthalmic solution Commonly known as: Polytrim Place 1 drop into both eyes 3 (three) times daily as needed.       Allergies:  Allergies  Allergen Reactions  . Benazepril Anaphylaxis and Swelling    angioedema Throat and lip swelling  . Ondansetron Hcl Hives    Redness and hives post IV admin on 07/05/17  . Codeine Nausea And Vomiting  . Morphine Hives    Redness and hives noted post IV admin on 07/05/17    Past Medical History, Surgical history, Social  history, and Family History were reviewed and updated.  Review of Systems: All other 10 point review of systems is negative.   Physical Exam:  weight is 186 lb 12.8 oz (84.7 kg). Her oral temperature is 96.9 F (36.1 C) (abnormal). Her blood pressure is 141/74 (abnormal) and her pulse is 90. Her respiration is 19 and oxygen saturation is 98%.   Wt Readings from Last 3 Encounters:  06/16/19 186 lb 12.8 oz (84.7 kg)  05/19/19 186 lb (84.4 kg)  04/21/19 185 lb (83.9 kg)    Ocular: Sclerae unicteric, pupils equal, round and reactive to light Ear-nose-throat: Oropharynx clear, dentition fair Lymphatic: No cervical or supraclavicular adenopathy Lungs no rales or rhonchi, good excursion bilaterally Heart regular rate and rhythm, no murmur appreciated Abd  soft, nontender, positive bowel sounds, no liver or spleen tip palpated on exam, no fluid wave  MSK no focal spinal tenderness, no joint edema Neuro: non-focal, well-oriented, appropriate affect Breasts: Deferred   Lab Results  Component Value Date   WBC 2.8 (L) 06/16/2019   HGB 11.5 (L) 06/16/2019   HCT 36.5 06/16/2019   MCV 93.6 06/16/2019   PLT 143 (L) 06/16/2019   Lab Results  Component Value Date   FERRITIN 169 09/16/2016   IRON 59 09/16/2016   TIBC 281 09/16/2016   UIBC 222 09/16/2016   IRONPCTSAT 21 09/16/2016   Lab Results  Component Value Date   RETICCTPCT 0.8 07/16/2016   RBC 3.90 06/16/2019   RETICCTABS 32,640 07/16/2016   Lab Results  Component Value Date   KPAFRELGTCHN 48.7 (H) 05/19/2019   LAMBDASER 11.7 05/19/2019   KAPLAMBRATIO 4.16 (H) 05/19/2019   Lab Results  Component Value Date   IGGSERUM 2,290 (H) 05/19/2019   IGA 233 05/19/2019   IGMSERUM 99 05/19/2019   Lab Results  Component Value Date   TOTALPROTELP 7.0 05/19/2019   ALBUMINELP 3.3 05/19/2019   A1GS 0.2 05/19/2019   A2GS 0.9 05/19/2019   BETS 0.9 05/19/2019   BETA2SER 0.5 07/16/2016   GAMS 1.6 05/19/2019   MSPIKE 0.7 (H) 05/19/2019    SPEI Comment 10/07/2018     Chemistry      Component Value Date/Time   NA 142 06/16/2019 0905   NA 143 11/08/2017 1127   NA 140 12/31/2016 1000   K 3.8 06/16/2019 0905   K 4.0 11/08/2017 1127   K 3.6 12/31/2016 1000   CL 108 06/16/2019 0905   CL 107 11/08/2017 1127   CO2 26 06/16/2019 0905   CO2 25 11/08/2017 1127   CO2 24 12/31/2016 1000   BUN 17 06/16/2019 0905   BUN 19 11/08/2017 1127   BUN 22.3 12/31/2016 1000   CREATININE 0.65 06/16/2019 0905   CREATININE 0.9 11/08/2017 1127   CREATININE 0.8 12/31/2016 1000      Component Value Date/Time   CALCIUM 8.6 (L) 06/16/2019 0905   CALCIUM 9.9 11/08/2017 1127   CALCIUM 9.5 12/31/2016 1000   ALKPHOS 41 06/16/2019 0905   ALKPHOS 44 11/08/2017 1127   ALKPHOS 51 12/31/2016 1000   AST 15 06/16/2019 0905   AST 28 12/31/2016 1000   ALT 13 06/16/2019 0905   ALT 36 11/08/2017 1127   ALT 16 12/31/2016 1000   BILITOT 0.6 06/16/2019 0905   BILITOT 0.46 12/31/2016 1000       Impression and Plan: Tami [name] is a very pleasant 63 yo African American female with a previous IgG kappa MGUS with progression to myeloma.  I spoke with Dr. Marin Olp about her symptoms and we will have her hold her Pomalyst for 2 weeks and then restart at 2 mg Po daily on 21/off 7. Tami [name] verbalized understanding and is in agreement with this.  We will proceed with Velcade today as planned.  We will see her back in a other 4 weeks.  Tami [name] will contact our office with any questions or concerns. We can certainly see her sooner if needed.   Laverna Peace, NP 8/7/202010:27 AM

## 2019-06-16 NOTE — Patient Instructions (Signed)
[facility] Discharge Instructions for Patients Receiving Chemotherapy  Today you received the following chemotherapy agents Velcade To help prevent nausea and vomiting after your treatment, we encourage you to take your nausea medication as prescribed.   If you develop nausea and vomiting that is not controlled by your nausea medication, call the clinic.   BELOW ARE SYMPTOMS THAT SHOULD BE REPORTED IMMEDIATELY:  *FEVER GREATER THAN 100.5 F  *CHILLS WITH OR WITHOUT FEVER  NAUSEA AND VOMITING THAT IS NOT CONTROLLED WITH YOUR NAUSEA MEDICATION  *UNUSUAL SHORTNESS OF BREATH  *UNUSUAL BRUISING OR BLEEDING  TENDERNESS IN MOUTH AND THROAT WITH OR WITHOUT PRESENCE OF ULCERS  *URINARY PROBLEMS  *BOWEL PROBLEMS  UNUSUAL RASH Items with * indicate a potential emergency and should be followed up as soon as possible.  Feel free to call the clinic should you have any questions or concerns. The clinic phone number is [phone].  Please show the CHEMO ALERT CARD at check-in to the Emergency Department and triage nurse.   

## 2019-06-16 NOTE — Telephone Encounter (Signed)
Pomalyst 2mg  Dose Auth# [id number] Rx refill sent

## 2019-06-16 NOTE — Progress Notes (Signed)
Ok to treat with ANC 1.2 per Dr Ennever.   

## 2019-06-17 LAB — IGG, IGA, IGM
IgA: 181 mg/dL (ref 87–352)
IgG (Immunoglobin G), Serum: 1727 mg/dL — ABNORMAL HIGH (ref 586–1602)
IgM (Immunoglobulin M), Srm: 78 mg/dL (ref 26–217)

## 2019-06-19 LAB — KAPPA/LAMBDA LIGHT CHAINS
Kappa free light chain: 38.6 mg/L — ABNORMAL HIGH (ref 3.3–19.4)
Kappa, lambda light chain ratio: 5.29 — ABNORMAL HIGH (ref 0.26–1.65)
Lambda free light chains: 7.3 mg/L (ref 5.7–26.3)

## 2019-06-21 LAB — PROTEIN ELECTROPHORESIS, SERUM, WITH REFLEX
A/G Ratio: 0.9 (ref 0.7–1.7)
Albumin ELP: 3.5 g/dL (ref 2.9–4.4)
Alpha-1-Globulin: 0.2 g/dL (ref 0.0–0.4)
Alpha-2-Globulin: 0.9 g/dL (ref 0.4–1.0)
Beta Globulin: 1 g/dL (ref 0.7–1.3)
Gamma Globulin: 1.7 g/dL (ref 0.4–1.8)
Globulin, Total: 3.8 g/dL (ref 2.2–3.9)
M-Spike, %: 0.8 g/dL — ABNORMAL HIGH
SPEP Interpretation: 0
Total Protein ELP: 7.3 g/dL (ref 6.0–8.5)

## 2019-06-21 LAB — IMMUNOFIXATION REFLEX, SERUM
IgA: 201 mg/dL (ref 87–352)
IgG (Immunoglobin G), Serum: 2166 mg/dL — ABNORMAL HIGH (ref 586–1602)
IgM (Immunoglobulin M), Srm: 89 mg/dL (ref 26–217)

## 2019-06-23 ENCOUNTER — Inpatient Hospital Stay: Payer: Medicare Other

## 2019-06-23 ENCOUNTER — Other Ambulatory Visit: Payer: Self-pay

## 2019-06-23 VITALS — BP 121/73 | HR 96 | Temp 97.7°F | Resp 18

## 2019-06-23 DIAGNOSIS — C9000 Multiple myeloma not having achieved remission: Secondary | ICD-10-CM

## 2019-06-23 DIAGNOSIS — Z5112 Encounter for antineoplastic immunotherapy: Secondary | ICD-10-CM | POA: Diagnosis not present

## 2019-06-23 DIAGNOSIS — J40 Bronchitis, not specified as acute or chronic: Secondary | ICD-10-CM | POA: Diagnosis not present

## 2019-06-23 LAB — CMP (CANCER CENTER ONLY)
ALT: 11 U/L (ref 0–44)
AST: 16 U/L (ref 15–41)
Albumin: 3.7 g/dL (ref 3.5–5.0)
Alkaline Phosphatase: 47 U/L (ref 38–126)
Anion gap: 6 (ref 5–15)
BUN: 20 mg/dL (ref 8–23)
CO2: 28 mmol/L (ref 22–32)
Calcium: 9 mg/dL (ref 8.9–10.3)
Chloride: 108 mmol/L (ref 98–111)
Creatinine: 0.79 mg/dL (ref 0.44–1.00)
GFR, Est AFR Am: >60 mL/min (ref >60)
GFR, Estimated: >60 mL/min (ref >60)
Glucose, Bld: 101 mg/dL — ABNORMAL HIGH (ref 70–99)
Potassium: 3.6 mmol/L (ref 3.5–5.1)
Sodium: 142 mmol/L (ref 135–145)
Total Bilirubin: 0.4 mg/dL (ref 0.3–1.2)
Total Protein: 7 g/dL (ref 6.5–8.1)

## 2019-06-23 LAB — CBC WITH DIFFERENTIAL (CANCER CENTER ONLY)
Abs Immature Granulocytes: 0.01 10*3/uL (ref 0.00–0.07)
Basophils Absolute: 0.1 10*3/uL (ref 0.0–0.1)
Basophils Relative: 2 %
Eosinophils Absolute: 0.1 10*3/uL (ref 0.0–0.5)
Eosinophils Relative: 2 %
HCT: 36.9 % (ref 36.0–46.0)
Hemoglobin: 11.7 g/dL — ABNORMAL LOW (ref 12.0–15.0)
Immature Granulocytes: 0 %
Lymphocytes Relative: 33 %
Lymphs Abs: 1.3 10*3/uL (ref 0.7–4.0)
MCH: 29.5 pg (ref 26.0–34.0)
MCHC: 31.7 g/dL (ref 30.0–36.0)
MCV: 92.9 fL (ref 80.0–100.0)
Monocytes Absolute: 0.4 10*3/uL (ref 0.1–1.0)
Monocytes Relative: 11 %
Neutro Abs: 2 10*3/uL (ref 1.7–7.7)
Neutrophils Relative %: 52 %
Platelet Count: 149 10*3/uL — ABNORMAL LOW (ref 150–400)
RBC: 3.97 MIL/uL (ref 3.87–5.11)
RDW: 14.8 % (ref 11.5–15.5)
WBC Count: 3.9 10*3/uL — ABNORMAL LOW (ref 4.0–10.5)
nRBC: 0 % (ref 0.0–0.2)

## 2019-06-23 MED ORDER — BORTEZOMIB CHEMO SQ INJECTION 3.5 MG (2.5MG/ML)
1.3000 mg/m2 | Freq: Once | INTRAMUSCULAR | Status: AC
  Administered 2019-06-23: 15:00:00 2.5 mg via SUBCUTANEOUS
  Filled 2019-06-23: qty 1

## 2019-06-23 NOTE — Patient Instructions (Signed)
Bortezomib injection What is this medicine? BORTEZOMIB (bor TEZ oh mib) is a medicine that targets proteins in cancer cells and stops the cancer cells from growing. It is used to treat multiple myeloma and mantle-cell lymphoma. This medicine may be used for other purposes; ask your health care provider or pharmacist if you have questions. COMMON BRAND NAME(S): Velcade What should I tell my health care provider before I take this medicine? They need to know if you have any of these conditions:  diabetes  heart disease  irregular heartbeat  liver disease  on hemodialysis  low blood counts, like low white blood cells, platelets, or hemoglobin  peripheral neuropathy  taking medicine for blood pressure  an unusual or allergic reaction to bortezomib, mannitol, boron, other medicines, foods, dyes, or preservatives  pregnant or trying to get pregnant  breast-feeding How should I use this medicine? This medicine is for injection into a vein or for injection under the skin. It is given by a health care professional in a hospital or clinic setting. Talk to your pediatrician regarding the use of this medicine in children. Special care may be needed. Overdosage: If you think you have taken too much of this medicine contact a poison control center or emergency room at once. NOTE: This medicine is only for you. Do not share this medicine with others. What if I miss a dose? It is important not to miss your dose. Call your doctor or health care professional if you are unable to keep an appointment. What may interact with this medicine? This medicine may interact with the following medications:  ketoconazole  rifampin  ritonavir  St. John's Wort This list may not describe all possible interactions. Give your health care provider a list of all the medicines, herbs, non-prescription drugs, or dietary supplements you use. Also tell them if you smoke, drink alcohol, or use illegal drugs. Some  items may interact with your medicine. What should I watch for while using this medicine? You may get drowsy or dizzy. Do not drive, use machinery, or do anything that needs mental alertness until you know how this medicine affects you. Do not stand or sit up quickly, especially if you are an older patient. This reduces the risk of dizzy or fainting spells. In some cases, you may be given additional medicines to help with side effects. Follow all directions for their use. Call your doctor or health care professional for advice if you get a fever, chills or sore throat, or other symptoms of a cold or flu. Do not treat yourself. This drug decreases your body's ability to fight infections. Try to avoid being around people who are sick. This medicine may increase your risk to bruise or bleed. Call your doctor or health care professional if you notice any unusual bleeding. You may need blood work done while you are taking this medicine. In some patients, this medicine may cause a serious brain infection that may cause death. If you have any problems seeing, thinking, speaking, walking, or standing, tell your doctor right away. If you cannot reach your doctor, urgently seek other source of medical care. Check with your doctor or health care professional if you get an attack of severe diarrhea, nausea and vomiting, or if you sweat a lot. The loss of too much body fluid can make it dangerous for you to take this medicine. Do not become pregnant while taking this medicine or for at least 7 months after stopping it. Women should inform their doctor  if they wish to become pregnant or think they might be pregnant. Men should not father a child while taking this medicine and for at least 4 months after stopping it. There is a potential for serious side effects to an unborn child. Talk to your health care professional or pharmacist for more information. Do not breast-feed an infant while taking this medicine or for 2  months after stopping it. This medicine may interfere with the ability to have a child. You should talk with your doctor or health care professional if you are concerned about your fertility. What side effects may I notice from receiving this medicine? Side effects that you should report to your doctor or health care professional as soon as possible:  allergic reactions like skin rash, itching or hives, swelling of the face, lips, or tongue  breathing problems  changes in hearing  changes in vision  fast, irregular heartbeat  feeling faint or lightheaded, falls  pain, tingling, numbness in the hands or feet  right upper belly pain  seizures  swelling of the ankles, feet, hands  unusual bleeding or bruising  unusually weak or tired  vomiting  yellowing of the eyes or skin Side effects that usually do not require medical attention (report to your doctor or health care professional if they continue or are bothersome):  changes in emotions or moods  constipation  diarrhea  loss of appetite  headache  irritation at site where injected  nausea This list may not describe all possible side effects. Call your doctor for medical advice about side effects. You may report side effects to FDA at 1-800-FDA-1088. Where should I keep my medicine? This drug is given in a hospital or clinic and will not be stored at home. NOTE: This sheet is a summary. It may not cover all possible information. If you have questions about this medicine, talk to your doctor, pharmacist, or health care provider.  2020 [employer] (2018-03-07 16:29:31)

## 2019-06-30 ENCOUNTER — Inpatient Hospital Stay: Payer: Medicare Other

## 2019-06-30 ENCOUNTER — Other Ambulatory Visit: Payer: Self-pay

## 2019-06-30 VITALS — BP 133/70 | HR 76 | Temp 97.8°F | Resp 18

## 2019-06-30 DIAGNOSIS — I509 Heart failure, unspecified: Secondary | ICD-10-CM | POA: Diagnosis not present

## 2019-06-30 DIAGNOSIS — C9000 Multiple myeloma not having achieved remission: Secondary | ICD-10-CM | POA: Diagnosis not present

## 2019-06-30 DIAGNOSIS — I10 Essential (primary) hypertension: Secondary | ICD-10-CM | POA: Diagnosis not present

## 2019-06-30 DIAGNOSIS — G4733 Obstructive sleep apnea (adult) (pediatric): Secondary | ICD-10-CM | POA: Diagnosis not present

## 2019-06-30 DIAGNOSIS — Z5112 Encounter for antineoplastic immunotherapy: Secondary | ICD-10-CM | POA: Diagnosis not present

## 2019-06-30 DIAGNOSIS — Z8673 Personal history of transient ischemic attack (TIA), and cerebral infarction without residual deficits: Secondary | ICD-10-CM | POA: Diagnosis not present

## 2019-06-30 LAB — CBC WITH DIFFERENTIAL (CANCER CENTER ONLY)
Abs Immature Granulocytes: 0.01 10*3/uL (ref 0.00–0.07)
Basophils Absolute: 0 10*3/uL (ref 0.0–0.1)
Basophils Relative: 1 %
Eosinophils Absolute: 0.2 10*3/uL (ref 0.0–0.5)
Eosinophils Relative: 5 %
HCT: 37.5 % (ref 36.0–46.0)
Hemoglobin: 11.8 g/dL — ABNORMAL LOW (ref 12.0–15.0)
Immature Granulocytes: 0 %
Lymphocytes Relative: 37 %
Lymphs Abs: 1.4 10*3/uL (ref 0.7–4.0)
MCH: 29.5 pg (ref 26.0–34.0)
MCHC: 31.5 g/dL (ref 30.0–36.0)
MCV: 93.8 fL (ref 80.0–100.0)
Monocytes Absolute: 0.5 10*3/uL (ref 0.1–1.0)
Monocytes Relative: 14 %
Neutro Abs: 1.5 10*3/uL — ABNORMAL LOW (ref 1.7–7.7)
Neutrophils Relative %: 43 %
Platelet Count: 165 10*3/uL (ref 150–400)
RBC: 4 MIL/uL (ref 3.87–5.11)
RDW: 14.6 % (ref 11.5–15.5)
WBC Count: 3.6 10*3/uL — ABNORMAL LOW (ref 4.0–10.5)
nRBC: 0 % (ref 0.0–0.2)

## 2019-06-30 LAB — CMP (CANCER CENTER ONLY)
ALT: 12 U/L (ref 0–44)
AST: 18 U/L (ref 15–41)
Albumin: 3.8 g/dL (ref 3.5–5.0)
Alkaline Phosphatase: 46 U/L (ref 38–126)
Anion gap: 6 (ref 5–15)
BUN: 15 mg/dL (ref 8–23)
CO2: 30 mmol/L (ref 22–32)
Calcium: 9.1 mg/dL (ref 8.9–10.3)
Chloride: 107 mmol/L (ref 98–111)
Creatinine: 0.71 mg/dL (ref 0.44–1.00)
GFR, Est AFR Am: >60 mL/min (ref >60)
GFR, Estimated: >60 mL/min (ref >60)
Glucose, Bld: 98 mg/dL (ref 70–99)
Potassium: 3.8 mmol/L (ref 3.5–5.1)
Sodium: 143 mmol/L (ref 135–145)
Total Bilirubin: 0.4 mg/dL (ref 0.3–1.2)
Total Protein: 7.9 g/dL (ref 6.5–8.1)

## 2019-06-30 MED ORDER — PROCHLORPERAZINE MALEATE 10 MG PO TABS: 10.0000 mg | ORAL_TABLET | Freq: Once | ORAL | Status: DC

## 2019-06-30 MED ORDER — BORTEZOMIB CHEMO SQ INJECTION 3.5 MG (2.5MG/ML)
1.3000 mg/m2 | Freq: Once | INTRAMUSCULAR | Status: AC
  Administered 2019-06-30: 15:00:00 2.5 mg via SUBCUTANEOUS
  Filled 2019-06-30: qty 1

## 2019-06-30 NOTE — Patient Instructions (Signed)
Bortezomib injection What is this medicine? BORTEZOMIB (bor TEZ oh mib) is a medicine that targets proteins in cancer cells and stops the cancer cells from growing. It is used to treat multiple myeloma and mantle-cell lymphoma. This medicine may be used for other purposes; ask your health care provider or pharmacist if you have questions. COMMON BRAND NAME(S): Velcade What should I tell my health care provider before I take this medicine? They need to know if you have any of these conditions:  diabetes  heart disease  irregular heartbeat  liver disease  on hemodialysis  low blood counts, like low white blood cells, platelets, or hemoglobin  peripheral neuropathy  taking medicine for blood pressure  an unusual or allergic reaction to bortezomib, mannitol, boron, other medicines, foods, dyes, or preservatives  pregnant or trying to get pregnant  breast-feeding How should I use this medicine? This medicine is for injection into a vein or for injection under the skin. It is given by a health care professional in a hospital or clinic setting. Talk to your pediatrician regarding the use of this medicine in children. Special care may be needed. Overdosage: If you think you have taken too much of this medicine contact a poison control center or emergency room at once. NOTE: This medicine is only for you. Do not share this medicine with others. What if I miss a dose? It is important not to miss your dose. Call your doctor or health care professional if you are unable to keep an appointment. What may interact with this medicine? This medicine may interact with the following medications:  ketoconazole  rifampin  ritonavir  St. John's Wort This list may not describe all possible interactions. Give your health care provider a list of all the medicines, herbs, non-prescription drugs, or dietary supplements you use. Also tell them if you smoke, drink alcohol, or use illegal drugs. Some  items may interact with your medicine. What should I watch for while using this medicine? You may get drowsy or dizzy. Do not drive, use machinery, or do anything that needs mental alertness until you know how this medicine affects you. Do not stand or sit up quickly, especially if you are an older patient. This reduces the risk of dizzy or fainting spells. In some cases, you may be given additional medicines to help with side effects. Follow all directions for their use. Call your doctor or health care professional for advice if you get a fever, chills or sore throat, or other symptoms of a cold or flu. Do not treat yourself. This drug decreases your body's ability to fight infections. Try to avoid being around people who are sick. This medicine may increase your risk to bruise or bleed. Call your doctor or health care professional if you notice any unusual bleeding. You may need blood work done while you are taking this medicine. In some patients, this medicine may cause a serious brain infection that may cause death. If you have any problems seeing, thinking, speaking, walking, or standing, tell your doctor right away. If you cannot reach your doctor, urgently seek other source of medical care. Check with your doctor or health care professional if you get an attack of severe diarrhea, nausea and vomiting, or if you sweat a lot. The loss of too much body fluid can make it dangerous for you to take this medicine. Do not become pregnant while taking this medicine or for at least 7 months after stopping it. Women should inform their doctor   if they wish to become pregnant or think they might be pregnant. Men should not father a child while taking this medicine and for at least 4 months after stopping it. There is a potential for serious side effects to an unborn child. Talk to your health care professional or pharmacist for more information. Do not breast-feed an infant while taking this medicine or for 2  months after stopping it. This medicine may interfere with the ability to have a child. You should talk with your doctor or health care professional if you are concerned about your fertility. What side effects may I notice from receiving this medicine? Side effects that you should report to your doctor or health care professional as soon as possible:  allergic reactions like skin rash, itching or hives, swelling of the face, lips, or tongue  breathing problems  changes in hearing  changes in vision  fast, irregular heartbeat  feeling faint or lightheaded, falls  pain, tingling, numbness in the hands or feet  right upper belly pain  seizures  swelling of the ankles, feet, hands  unusual bleeding or bruising  unusually weak or tired  vomiting  yellowing of the eyes or skin Side effects that usually do not require medical attention (report to your doctor or health care professional if they continue or are bothersome):  changes in emotions or moods  constipation  diarrhea  loss of appetite  headache  irritation at site where injected  nausea This list may not describe all possible side effects. Call your doctor for medical advice about side effects. You may report side effects to FDA at 1-800-FDA-1088. Where should I keep my medicine? This drug is given in a hospital or clinic and will not be stored at home. NOTE: This sheet is a summary. It may not cover all possible information. If you have questions about this medicine, talk to your doctor, pharmacist, or health care provider.  2020 [employer] (2018-03-07 16:29:31)  

## 2019-07-03 ENCOUNTER — Ambulatory Visit: Payer: Medicare Other | Admitting: Family Medicine

## 2019-07-03 DIAGNOSIS — G4733 Obstructive sleep apnea (adult) (pediatric): Secondary | ICD-10-CM | POA: Diagnosis not present

## 2019-07-03 DIAGNOSIS — J328 Other chronic sinusitis: Secondary | ICD-10-CM | POA: Diagnosis not present

## 2019-07-03 DIAGNOSIS — Z1159 Encounter for screening for other viral diseases: Secondary | ICD-10-CM | POA: Diagnosis not present

## 2019-07-06 ENCOUNTER — Other Ambulatory Visit: Payer: Self-pay

## 2019-07-06 DIAGNOSIS — H53149 Visual discomfort, unspecified: Secondary | ICD-10-CM | POA: Diagnosis not present

## 2019-07-06 DIAGNOSIS — G43909 Migraine, unspecified, not intractable, without status migrainosus: Secondary | ICD-10-CM | POA: Diagnosis not present

## 2019-07-06 DIAGNOSIS — R110 Nausea: Secondary | ICD-10-CM | POA: Diagnosis not present

## 2019-07-06 DIAGNOSIS — I10 Essential (primary) hypertension: Secondary | ICD-10-CM | POA: Diagnosis not present

## 2019-07-07 DIAGNOSIS — R0902 Hypoxemia: Secondary | ICD-10-CM | POA: Diagnosis not present

## 2019-07-07 DIAGNOSIS — G4733 Obstructive sleep apnea (adult) (pediatric): Secondary | ICD-10-CM | POA: Diagnosis not present

## 2019-07-10 ENCOUNTER — Emergency Department (HOSPITAL_BASED_OUTPATIENT_CLINIC_OR_DEPARTMENT_OTHER)
Admission: EM | Admit: 2019-07-10 | Discharge: 2019-07-10 | Disposition: A | Discharge status: Home / Self Care | Payer: Medicare Other | Attending: Emergency Medicine | Admitting: Emergency Medicine

## 2019-07-10 ENCOUNTER — Emergency Department (HOSPITAL_BASED_OUTPATIENT_CLINIC_OR_DEPARTMENT_OTHER): Payer: Medicare Other

## 2019-07-10 ENCOUNTER — Other Ambulatory Visit: Payer: Self-pay

## 2019-07-10 ENCOUNTER — Encounter (HOSPITAL_BASED_OUTPATIENT_CLINIC_OR_DEPARTMENT_OTHER): Payer: Self-pay

## 2019-07-10 DIAGNOSIS — Z950 Presence of cardiac pacemaker: Secondary | ICD-10-CM | POA: Diagnosis not present

## 2019-07-10 DIAGNOSIS — I110 Hypertensive heart disease with heart failure: Secondary | ICD-10-CM | POA: Insufficient documentation

## 2019-07-10 DIAGNOSIS — Z9221 Personal history of antineoplastic chemotherapy: Secondary | ICD-10-CM | POA: Diagnosis not present

## 2019-07-10 DIAGNOSIS — Z20828 Contact with and (suspected) exposure to other viral communicable diseases: Secondary | ICD-10-CM | POA: Diagnosis not present

## 2019-07-10 DIAGNOSIS — R05 Cough: Secondary | ICD-10-CM | POA: Diagnosis not present

## 2019-07-10 DIAGNOSIS — Z8673 Personal history of transient ischemic attack (TIA), and cerebral infarction without residual deficits: Secondary | ICD-10-CM | POA: Diagnosis not present

## 2019-07-10 DIAGNOSIS — Z7982 Long term (current) use of aspirin: Secondary | ICD-10-CM | POA: Diagnosis not present

## 2019-07-10 DIAGNOSIS — Z79899 Other long term (current) drug therapy: Secondary | ICD-10-CM | POA: Insufficient documentation

## 2019-07-10 DIAGNOSIS — Z85828 Personal history of other malignant neoplasm of skin: Secondary | ICD-10-CM | POA: Insufficient documentation

## 2019-07-10 DIAGNOSIS — J069 Acute upper respiratory infection, unspecified: Secondary | ICD-10-CM | POA: Diagnosis not present

## 2019-07-10 DIAGNOSIS — R51 Headache: Secondary | ICD-10-CM | POA: Diagnosis present

## 2019-07-10 DIAGNOSIS — N390 Urinary tract infection, site not specified: Secondary | ICD-10-CM | POA: Diagnosis not present

## 2019-07-10 DIAGNOSIS — I509 Heart failure, unspecified: Secondary | ICD-10-CM | POA: Diagnosis not present

## 2019-07-10 LAB — CBC WITH DIFFERENTIAL/PLATELET
Abs Immature Granulocytes: 0.02 10*3/uL (ref 0.00–0.07)
Basophils Absolute: 0 10*3/uL (ref 0.0–0.1)
Basophils Relative: 0 %
Eosinophils Absolute: 0.1 10*3/uL (ref 0.0–0.5)
Eosinophils Relative: 3 %
HCT: 37 % (ref 36.0–46.0)
Hemoglobin: 12 g/dL (ref 12.0–15.0)
Immature Granulocytes: 0 %
Lymphocytes Relative: 28 %
Lymphs Abs: 1.3 10*3/uL (ref 0.7–4.0)
MCH: 30.2 pg (ref 26.0–34.0)
MCHC: 32.4 g/dL (ref 30.0–36.0)
MCV: 93 fL (ref 80.0–100.0)
Monocytes Absolute: 0.4 10*3/uL (ref 0.1–1.0)
Monocytes Relative: 9 %
Neutro Abs: 2.9 10*3/uL (ref 1.7–7.7)
Neutrophils Relative %: 60 %
Platelets: 138 10*3/uL — ABNORMAL LOW (ref 150–400)
RBC: 3.98 MIL/uL (ref 3.87–5.11)
RDW: 15 % (ref 11.5–15.5)
WBC: 4.8 10*3/uL (ref 4.0–10.5)
nRBC: 0 % (ref 0.0–0.2)

## 2019-07-10 LAB — URINALYSIS, MICROSCOPIC (REFLEX)

## 2019-07-10 LAB — BASIC METABOLIC PANEL
Anion gap: 8 (ref 5–15)
BUN: 22 mg/dL (ref 8–23)
CO2: 23 mmol/L (ref 22–32)
Calcium: 8.7 mg/dL — ABNORMAL LOW (ref 8.9–10.3)
Chloride: 107 mmol/L (ref 98–111)
Creatinine, Ser: 0.75 mg/dL (ref 0.44–1.00)
GFR calc Af Amer: >60 mL/min (ref >60)
GFR calc non Af Amer: >60 mL/min (ref >60)
Glucose, Bld: 121 mg/dL — ABNORMAL HIGH (ref 70–99)
Potassium: 3.3 mmol/L — ABNORMAL LOW (ref 3.5–5.1)
Sodium: 138 mmol/L (ref 135–145)

## 2019-07-10 LAB — URINALYSIS, ROUTINE W REFLEX MICROSCOPIC
Bilirubin Urine: NEGATIVE
Glucose, UA: NEGATIVE mg/dL
Hgb urine dipstick: NEGATIVE
Ketones, ur: 15 mg/dL — AB
Nitrite: NEGATIVE
Protein, ur: NEGATIVE mg/dL
Specific Gravity, Urine: 1.025 (ref 1.005–1.030)
pH: 5.5 (ref 5.0–8.0)

## 2019-07-10 LAB — LACTIC ACID, PLASMA: Lactic Acid, Venous: 1.6 mmol/L (ref 0.5–1.9)

## 2019-07-10 MED ORDER — ONDANSETRON HCL 4 MG/2ML IJ SOLN
INTRAMUSCULAR | Status: AC
  Filled 2019-07-10: qty 2

## 2019-07-10 MED ORDER — PROCHLORPERAZINE EDISYLATE 10 MG/2ML IJ SOLN
INTRAMUSCULAR | Status: AC
  Administered 2019-07-10: 10 mg via INTRAVENOUS
  Filled 2019-07-10: qty 2

## 2019-07-10 MED ORDER — AMOXICILLIN-POT CLAVULANATE 875-125 MG PO TABS: 1.0000 | ORAL_TABLET | Freq: Two times a day (BID) | ORAL | 0 refills | Status: DC

## 2019-07-10 MED ORDER — SODIUM CHLORIDE 0.9 % IV BOLUS
1000.0000 mL | Freq: Once | INTRAVENOUS | Status: AC
  Administered 2019-07-10: 14:00:00 1000 mL via INTRAVENOUS

## 2019-07-10 MED ORDER — PROCHLORPERAZINE EDISYLATE 10 MG/2ML IJ SOLN
10.0000 mg | Freq: Once | INTRAMUSCULAR | Status: AC
  Administered 2019-07-10: 14:00:00 10 mg via INTRAVENOUS

## 2019-07-10 NOTE — ED Triage Notes (Signed)
Pt c/o HA, body aches, sore throat, bilat ear pain-sx started 1 week ago-NAD-steady gait

## 2019-07-10 NOTE — Discharge Instructions (Addendum)
You were seen in the emergency department for symptoms of an upper respiratory infection.  You had blood work and a urinalysis that showed a possible urinary tract infection.  We also did Covid testing that was pending at the time of discharge.  Please drink plenty of fluids and finish your antibiotics.  We will contact you if your Covid test is positive.  Until those results are back you should isolate.  Please contact your oncologist and primary care doctor for close follow-up.  Return if any concerns.

## 2019-07-10 NOTE — ED Provider Notes (Signed)
[facility]PARTMENT Provider Note   CSN: [id number] Arrival date & time: 07/10/19  1229     History   Chief Complaint Chief Complaint  Patient presents with  . Headache    HPI Tami [name] is a 63 y.o. female.  She is complaining of 1 week of feeling lousy.  She is complained of headache sore throats blocked up ears feeling unbalanced when she is standing up at times nausea and diarrhea.  There is been some cough nonproductive.  She is felt hot and cold but does not have a fever.  She checked her blood pressure today and found it to be elevated.  She said she had COVID testing about a month ago that was negative.  No sick contacts or recent travel.  She has a history of multiple myeloma and is on what sounds like chemo.     The history is provided by the patient.  Illness Location:  Ha, blocked ears, st, cough, diarrhea Severity:  Moderate Onset quality:  Gradual Duration:  7 days Timing:  Constant Progression:  Worsening Chronicity:  New Relieved by:  Nothing Worsened by:  Nothing Ineffective treatments:  Nothing Associated symptoms: congestion, cough, diarrhea, ear pain, fatigue, headaches, myalgias, nausea, rhinorrhea, shortness of breath and sore throat   Associated symptoms: no abdominal pain, no chest pain, no fever, no loss of consciousness, no rash, no vomiting and no wheezing     Past Medical History:  Diagnosis Date  . Abnormal SPEP 07/26/2016  . Arthritis    knees, hands  . Back pain 07/14/2016  . Bowel obstruction ([place]) 11/2014  . C. difficile diarrhea   . CHF (congestive heart failure) ([place])   . Colitis   . Congestive heart failure ([place]) 02/24/2015   S/p pacemaker  . Depression   . Elevated sed rate 08/15/2017  . Epigastric pain 03/22/2017  . GERD (gastroesophageal reflux disease)   . Hyperlipidemia   . Hypertension   . 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 Low back pain 07/14/2016  . 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 Multiple myeloma ([place]) 07/20/2018  . Multiple myeloma not having achieved remission ([place]) 07/20/2018  . Myalgia 12/31/2016  . Obstructive sleep apnea 03/31/2015  . SOB (shortness of breath) 04/09/2016  . Stroke ([place])    TIAs  . UTI (urinary tract infection)     Patient Active Problem List   Diagnosis Date Noted  . Palpitation 05/15/2019  . Urticaria 02/22/2019  . Conjunctivitis 10/17/2018  . Diarrhea 09/04/2018  . Thyroid nodule 08/26/2018  . Drug rash 08/26/2018  . Multiple myeloma ([place]) 07/20/2018  . Multiple myeloma not having achieved remission ([place]) 07/20/2018  . Hematuria 07/11/2018  . Seasonal allergies 06/23/2018  . Right shoulder pain 06/07/2018  . Neck pain 06/07/2018  . Paresthesias 06/07/2018  . 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 Pain in thoracic spine 03/18/2018  . Thoracic back pain 02/15/2018  . 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 Cystitis 10/25/2017  . C. difficile colitis 09/15/2017  . Hypokalemia 08/20/2017  . Anemia 08/20/2017  . Elevated sed rate 08/15/2017  . 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 Epigastric pain 03/22/2017  . Myalgia 12/31/2016  . SOB (shortness of breath) 07/26/2016  . Abnormal SPEP 07/26/2016  . Low back pain 07/14/2016  . Insomnia 12/29/2015  . Tension headache 10/21/2015  . Muscle spasms of neck 10/18/2015  . Dysuria 10/13/2015  . Sinusitis 07/07/2015  . AP (abdominal pain) 07/07/2015  .  Cardiomyopathy ([place]) 04/19/2015  . Chest wall pain 04/19/2015  . Obstructive sleep apnea 03/31/2015  . Congestive heart failure ([place]) 02/24/2015  . Arthritis   . Hyperlipidemia   . Hypertension   . Depression   . Stroke ([place])   . GERD (gastroesophageal reflux disease)   . Bowel obstruction ([place]) 11/09/2014    Past Surgical History:  Procedure Laterality Date  . ABDOMINAL HYSTERECTOMY     menorraghia, 2006, total  . CHOLECYSTECTOMY    . COLONOSCOPY  2018   [facility] per patient  . ESOPHAGOGASTRODUODENOSCOPY  2018   [facility]   . KNEE SURGERY     right, repair torn torn cartialage  . PACEMAKER INSERTION  08/2014  . TONSILLECTOMY    . TUBAL LIGATION       OB History   No obstetric history on file.      Home Medications    Prior to Admission medications   Medication Sig Start Date End Date Taking? Authorizing Provider  acetaminophen (TYLENOL) 325 MG tablet Take 650 mg by mouth. 06/07/17   [provider]  albuterol (PROVENTIL HFA;VENTOLIN HFA) 108 (90 Base) MCG/ACT inhaler Inhale 2 puffs into the lungs every 6 (six) hours as needed for wheezing or shortness of breath. 08/18/18   Saguier, Percell Miller, PA-C  alendronate (FOSAMAX) 70 MG tablet Take 70 mg by mouth daily. 03/16/19   [provider]  amLODipine (NORVASC) 10 MG tablet Take 1 tablet (10 mg total) by mouth daily. 08/05/18 08/05/19  Mosie Lukes, MD  aspirin EC 81 MG tablet Take 1 tablet (81 mg total) by mouth daily. 02/15/18   Mosie Lukes, MD  carvedilol (COREG) 25 MG tablet Take 1 tablet (25 mg total) by mouth 2 (two) times daily with a meal. 02/15/18   Mosie Lukes, MD  cetirizine (ZYRTEC) 10 MG tablet Take 1 tablet (10 mg total) by mouth every morning. 02/22/19   Mosie Lukes, MD  conjugated estrogens (PREMARIN) vaginal cream Place vaginally. 01/31/14   [provider]  cyclobenzaprine (FLEXERIL) 10 MG tablet Take 10 mg by mouth as needed.     [provider]  dexamethasone (DECADRON) 4 MG tablet Take 5 pills at one time with food ONCE a WEEK!! 08/05/18   [name], MD  dicyclomine (BENTYL) 20 MG tablet Take 1 tablet (20 mg total) by mouth 3 (three) times daily as needed for spasms. 04/14/18   Mosie Lukes, MD  erythromycin ophthalmic ointment Place a 1/2 inch ribbon of ointment into the lower eyelid. 10/29/18   Khatri, Hina, PA-C  famciclovir (FAMVIR) 250 MG tablet Take 1 tablet (250 mg total) by mouth daily. 07/20/18    [name], MD  famotidine (PEPCID) 20 MG tablet Take 1 tablet (20 mg total) by mouth 2 (two) times daily. 02/22/19   Mosie Lukes, MD  famotidine (PEPCID) 40 MG/5ML suspension Take 5 mLs (40 mg total) by mouth at bedtime as needed for heartburn or indigestion. 05/15/19   Mosie Lukes, MD  fluticasone (FLONASE) 50 MCG/ACT nasal spray Place 2 sprays into both nostrils daily. 08/18/18   Saguier, Percell Miller, PA-C  HYDROcodone-acetaminophen (NORCO/VICODIN) 5-325 MG tablet Take 1 tablet by mouth every 4 (four) hours as needed for moderate pain or severe pain. 11/21/18   Mosie Lukes, MD  hyoscyamine (LEVSIN SL) 0.125 MG SL tablet Place 1 tablet (0.125 mg total) under the tongue every 4 (four) hours as needed. 07/25/18   Mosie Lukes,  MD  levocetirizine (XYZAL) 5 MG tablet Take 1 tablet (5 mg total) by mouth every evening. 06/23/18   Shelda Pal, DO  lidocaine (LIDODERM) 5 % Place 1 patch onto the skin daily. Remove & Discard patch within 12 hours or as directed by MD 06/07/18   Jodelle Green, FNP  Multiple Vitamins-Minerals (MULTIVITAMIN ADULT PO) Take by mouth.    [provider]  mupirocin ointment (BACTROBAN) 2 % Apply 1 application topically 2 (two) times daily. 10/17/18   Mosie Lukes, MD  ondansetron (ZOFRAN) 4 MG tablet Take 1 tablet (4 mg total) by mouth every 8 (eight) hours as needed for nausea or vomiting. 04/14/18   Mosie Lukes, MD  pantoprazole (PROTONIX) 40 MG tablet Take 1 tablet (40 mg total) by mouth 2 (two) times daily. 02/15/18   Mosie Lukes, MD  pomalidomide (POMALYST) 2 MG capsule Take one capsule daily-Days #1-21. Repeat every 28 days. [id number] 06/16/19   [name], MD  potassium chloride SA (K-DUR,KLOR-CON) 20 MEQ tablet Take 2 tablets (40 mEq total) by mouth once for 1 dose. 09/20/18 09/20/18  Mosie Lukes, MD  prochlorperazine (COMPAZINE) 10 MG tablet Take 1 tablet (10 mg total) by mouth every 6 (six) hours as needed (Nausea or  vomiting). 03/03/19   [name], MD  sucralfate (CARAFATE) 1 g tablet Take 1 tablet (1 g total) by mouth 4 (four) times daily -  with meals and at bedtime. 06/29/18   Saguier, Percell Miller, PA-C  topiramate (TOPAMAX) 50 MG tablet Take 1 tablet (50 mg total) by mouth 2 (two) times daily. 05/15/19   Mosie Lukes, MD  triamterene-hydrochlorothiazide (MAXZIDE-25) 37.5-25 MG tablet Take 1 tablet by mouth daily. 01/06/19   [name], MD  trimethoprim-polymyxin b (POLYTRIM) ophthalmic solution Place 1 drop into both eyes 3 (three) times daily as needed. 10/17/18   Mosie Lukes, MD    Family History Family History  Problem Relation Age of Onset  . Diabetes Mother   . Hypertension Mother   . Heart disease Mother        s/p 1 stent  . Hyperlipidemia Mother   . Arthritis Mother   . Cancer Father        COLON  . Colon cancer Father 32  . Irritable bowel syndrome Sister   . Hyperlipidemia Daughter   . Hypertension Daughter   . Hypertension Maternal Grandmother   . Arthritis Maternal Grandmother   . Heart disease Maternal Grandfather        MI  . Hypertension Maternal Grandfather   . Arthritis Maternal Grandfather   . Hypertension Son   . Esophageal cancer Neg Hx     Social History Social History   Tobacco Use  . Smoking status: Never Smoker  . Smokeless tobacco: Never Used  Substance Use Topics  . Alcohol use: No  . Drug use: No     Allergies   Benazepril, Ondansetron hcl, Codeine, and Morphine   Review of Systems Review of Systems  Constitutional: Positive for fatigue. Negative for fever.  HENT: Positive for congestion, ear pain, rhinorrhea and sore throat.   Eyes: Negative for visual disturbance.  Respiratory: Positive for cough and shortness of breath. Negative for wheezing.   Cardiovascular: Negative for chest pain.  Gastrointestinal: Positive for diarrhea and nausea. Negative for abdominal pain and vomiting.  Genitourinary: Negative for dysuria.   Musculoskeletal: Positive for myalgias.  Skin: Negative for rash.  Neurological: Positive for headaches. Negative for  loss of consciousness.     Physical Exam Updated Vital Signs BP (!) 147/76 (BP Location: Left Arm)   Pulse 78   Temp 97.7 F (36.5 C) (Oral)   Resp 20   Ht '5\' 6"'$  (1.676 m)   Wt 86.2 kg   SpO2 98%   BMI 30.67 kg/m   Physical Exam Vitals signs and nursing note reviewed.  Constitutional:      General: She is not in acute distress.    Appearance: She is well-developed. She is not toxic-appearing.  HENT:     Head: Normocephalic and atraumatic.     Right Ear: Tympanic membrane normal.     Left Ear: Tympanic membrane normal.     Mouth/Throat:     Mouth: Mucous membranes are moist.     Pharynx: Oropharynx is clear.  Eyes:     Extraocular Movements: Extraocular movements intact.     Conjunctiva/sclera: Conjunctivae normal.     Pupils: Pupils are equal, round, and reactive to light.  Neck:     Musculoskeletal: Neck supple.  Cardiovascular:     Rate and Rhythm: Normal rate and regular rhythm.     Heart sounds: No murmur.  Pulmonary:     Effort: Pulmonary effort is normal. No respiratory distress.     Breath sounds: Normal breath sounds.  Abdominal:     Palpations: Abdomen is soft.     Tenderness: There is no abdominal tenderness. There is no guarding or rebound.  Musculoskeletal: Normal range of motion.     Right lower leg: No edema.     Left lower leg: No edema.  Skin:    General: Skin is warm and dry.     Capillary Refill: Capillary refill takes less than 2 seconds.  Neurological:     General: No focal deficit present.     Mental Status: She is alert and oriented to person, place, and time.      ED Treatments / Results  Labs (all labs ordered are listed, but only abnormal results are displayed) Labs Reviewed  CBC WITH DIFFERENTIAL/PLATELET - Abnormal; Notable for the following components:      Result Value   Platelets 138 (*)    All other  components within normal limits  URINALYSIS, ROUTINE W REFLEX MICROSCOPIC - Abnormal; Notable for the following components:   APPearance CLOUDY (*)    Ketones, ur 15 (*)    Leukocytes,Ua MODERATE (*)    All other components within normal limits  BASIC METABOLIC PANEL - Abnormal; Notable for the following components:   Potassium 3.3 (*)    Glucose, Bld 121 (*)    Calcium 8.7 (*)    All other components within normal limits  URINALYSIS, MICROSCOPIC (REFLEX) - Abnormal; Notable for the following components:   Bacteria, UA MANY (*)    All other components within normal limits  NOVEL CORONAVIRUS, NAA (HOSP ORDER, SEND-OUT TO REF LAB; TAT 18-24 HRS)  CULTURE, BLOOD (ROUTINE X 2)  CULTURE, BLOOD (ROUTINE X 2)  URINE CULTURE  LACTIC ACID, PLASMA    EKG None  Radiology Dg Chest Port 1 View  Result Date: 07/10/2019 CLINICAL DATA:  Cough EXAM: PORTABLE CHEST 1 VIEW COMPARISON:  05/18/2019 FINDINGS: Gross cardiomegaly with left chest multi lead pacer. Both lungs are clear. The visualized skeletal structures are unremarkable. IMPRESSION: Cardiomegaly without acute abnormality of the lungs in AP portable projection. Electronically Signed   By: Eddie Candle M.D.   On: 07/10/2019 13:37    Procedures Procedures (including critical  care time)  Medications Ordered in ED Medications  sodium chloride 0.9 % bolus 1,000 mL (0 mLs Intravenous Stopped 07/10/19 1456)  prochlorperazine (COMPAZINE) injection 10 mg (10 mg Intravenous Given 07/10/19 1343)     Initial Impression / Assessment and Plan / ED Course  I have reviewed the triage vital signs and the nursing notes.  Pertinent labs & imaging results that were available during my care of the patient were reviewed by me and considered in my medical decision making (see chart for details).  Clinical Course as of Jul 09 1608  Mon Jul 09, 3080  3370 63 year old female here with multiple systemic complaints including headache sore throat cough  diarrhea earache nausea.  Afebrile here.  He is on active chemo also getting blood work   [MB]  1349 Nurse informing that the patient was feeling nauseous.  She is allergic to Zofran but has had Compazine before.  Ordered her some IV Compazine.   [MB]  8242 Labs starting come back showing a thrombocytopenia but that was noted on prior labs.   [MB]  3536 Reevaluated patient after IV fluids and Compazine.  She said her nausea and her headache improved.  Reviewed that her results of possible urine infection and covering her with some antibiotics for possible sinus infection.  She understands the Covid testing is pending and that she is need to keep close follow-up with her primary care doctor and oncologist   [MB]    Clinical Course User Index [MB] Hayden Rasmussen, MD        Final Clinical Impressions(s) / ED Diagnoses   Final diagnoses:  Upper respiratory tract infection, unspecified type  Lower urinary tract infectious disease    ED Discharge Orders         Ordered    amoxicillin-clavulanate (AUGMENTIN) 875-125 MG tablet  Every 12 hours     07/10/19 1451           Hayden Rasmussen, MD 07/10/19 1609

## 2019-07-11 LAB — NOVEL CORONAVIRUS, NAA (HOSP ORDER, SEND-OUT TO REF LAB; TAT 18-24 HRS): SARS-CoV-2, NAA: NOT DETECTED

## 2019-07-11 LAB — URINE CULTURE

## 2019-07-13 ENCOUNTER — Telehealth: Payer: Self-pay

## 2019-07-13 ENCOUNTER — Other Ambulatory Visit: Payer: Self-pay

## 2019-07-13 ENCOUNTER — Telehealth: Payer: Self-pay | Admitting: Family Medicine

## 2019-07-13 ENCOUNTER — Ambulatory Visit (INDEPENDENT_AMBULATORY_CARE_PROVIDER_SITE_OTHER): Payer: Medicare Other | Admitting: Family Medicine

## 2019-07-13 ENCOUNTER — Encounter: Payer: Self-pay | Admitting: Family Medicine

## 2019-07-13 VITALS — BP 118/84 | HR 99 | Temp 97.8°F | Resp 18 | Wt 189.0 lb

## 2019-07-13 DIAGNOSIS — J302 Other seasonal allergic rhinitis: Secondary | ICD-10-CM | POA: Diagnosis not present

## 2019-07-13 DIAGNOSIS — Z23 Encounter for immunization: Secondary | ICD-10-CM

## 2019-07-13 DIAGNOSIS — R3911 Hesitancy of micturition: Secondary | ICD-10-CM

## 2019-07-13 DIAGNOSIS — R350 Frequency of micturition: Secondary | ICD-10-CM | POA: Diagnosis not present

## 2019-07-13 DIAGNOSIS — R519 Headache, unspecified: Secondary | ICD-10-CM

## 2019-07-13 DIAGNOSIS — R739 Hyperglycemia, unspecified: Secondary | ICD-10-CM | POA: Diagnosis not present

## 2019-07-13 DIAGNOSIS — R51 Headache: Secondary | ICD-10-CM | POA: Diagnosis not present

## 2019-07-13 DIAGNOSIS — I10 Essential (primary) hypertension: Secondary | ICD-10-CM

## 2019-07-13 DIAGNOSIS — H109 Unspecified conjunctivitis: Secondary | ICD-10-CM

## 2019-07-13 DIAGNOSIS — E785 Hyperlipidemia, unspecified: Secondary | ICD-10-CM

## 2019-07-13 DIAGNOSIS — C9000 Multiple myeloma not having achieved remission: Secondary | ICD-10-CM

## 2019-07-13 LAB — URINALYSIS, ROUTINE W REFLEX MICROSCOPIC
Bilirubin Urine: NEGATIVE
Hgb urine dipstick: NEGATIVE
Ketones, ur: NEGATIVE
Nitrite: NEGATIVE
Specific Gravity, Urine: 1.025 (ref 1.000–1.030)
Total Protein, Urine: NEGATIVE
Urine Glucose: NEGATIVE
Urobilinogen, UA: 0.2 (ref 0.0–1.0)
pH: 5.5 (ref 5.0–8.0)

## 2019-07-13 LAB — COMPREHENSIVE METABOLIC PANEL
ALT: 13 U/L (ref 0–35)
AST: 17 U/L (ref 0–37)
Albumin: 3.9 g/dL (ref 3.5–5.2)
Alkaline Phosphatase: 43 U/L (ref 39–117)
BUN: 21 mg/dL (ref 6–23)
CO2: 29 mEq/L (ref 19–32)
Calcium: 9.3 mg/dL (ref 8.4–10.5)
Chloride: 103 mEq/L (ref 96–112)
Creatinine, Ser: 0.9 mg/dL (ref 0.40–1.20)
GFR: 76.01 mL/min (ref >60.00)
Glucose, Bld: 97 mg/dL (ref 70–99)
Potassium: 4 mEq/L (ref 3.5–5.1)
Sodium: 140 mEq/L (ref 135–145)
Total Bilirubin: 0.5 mg/dL (ref 0.2–1.2)
Total Protein: 7.6 g/dL (ref 6.0–8.3)

## 2019-07-13 LAB — CBC WITH DIFFERENTIAL/PLATELET
Basophils Absolute: 0 10*3/uL (ref 0.0–0.1)
Basophils Relative: 0.8 % (ref 0.0–3.0)
Eosinophils Absolute: 0.1 10*3/uL (ref 0.0–0.7)
Eosinophils Relative: 1.3 % (ref 0.0–5.0)
HCT: 37.5 % (ref 36.0–46.0)
Hemoglobin: 12.1 g/dL (ref 12.0–15.0)
Lymphocytes Relative: 29.1 % (ref 12.0–46.0)
Lymphs Abs: 1.3 10*3/uL (ref 0.7–4.0)
MCHC: 32.4 g/dL (ref 30.0–36.0)
MCV: 92.2 fl (ref 78.0–100.0)
Monocytes Absolute: 0.8 10*3/uL (ref 0.1–1.0)
Monocytes Relative: 19.1 % — ABNORMAL HIGH (ref 3.0–12.0)
Neutro Abs: 2.2 10*3/uL (ref 1.4–7.7)
Neutrophils Relative %: 49.7 % (ref 43.0–77.0)
Platelets: 148 10*3/uL — ABNORMAL LOW (ref 150.0–400.0)
RBC: 4.06 Mil/uL (ref 3.87–5.11)
RDW: 15.4 % (ref 11.5–15.5)
WBC: 4.3 10*3/uL (ref 4.0–10.5)

## 2019-07-13 LAB — LIPID PANEL
Cholesterol: 200 mg/dL (ref 0–200)
HDL: 66 mg/dL (ref >39.00)
LDL Cholesterol: 104 mg/dL — ABNORMAL HIGH (ref 0–99)
NonHDL: 133.64
Total CHOL/HDL Ratio: 3
Triglycerides: 146 mg/dL (ref 0.0–149.0)
VLDL: 29.2 mg/dL (ref 0.0–40.0)

## 2019-07-13 LAB — HEMOGLOBIN A1C: Hgb A1c MFr Bld: 5.6 % (ref 4.6–6.5)

## 2019-07-13 LAB — TSH: TSH: 0.93 u[IU]/mL (ref 0.35–4.50)

## 2019-07-13 MED ORDER — FLUTICASONE PROPIONATE 50 MCG/ACT NA SUSP: 2.0000 | Freq: Every day | NASAL | 1 refills | Status: DC

## 2019-07-13 MED ORDER — POTASSIUM CHLORIDE CRYS ER 20 MEQ PO TBCR: 40.0000 meq | EXTENDED_RELEASE_TABLET | Freq: Once | ORAL | 3 refills | Status: DC

## 2019-07-13 MED ORDER — TRIAMTERENE-HCTZ 37.5-25 MG PO TABS: 1.0000 | ORAL_TABLET | Freq: Every day | ORAL | 4 refills | Status: DC

## 2019-07-13 MED ORDER — FAMOTIDINE 40 MG/5ML PO SUSR: 40.0000 mg | Freq: Every evening | ORAL | 2 refills | Status: DC | PRN

## 2019-07-13 MED ORDER — FUROSEMIDE 20 MG PO TABS: 20.0000 mg | ORAL_TABLET | Freq: Every day | ORAL | 2 refills | Status: DC | PRN

## 2019-07-13 MED ORDER — AMLODIPINE BESYLATE 10 MG PO TABS: 10.0000 mg | ORAL_TABLET | Freq: Every day | ORAL | 1 refills | Status: DC

## 2019-07-13 MED ORDER — CARVEDILOL 25 MG PO TABS: 25.0000 mg | ORAL_TABLET | Freq: Two times a day (BID) | ORAL | 1 refills | Status: DC

## 2019-07-13 MED ORDER — CETIRIZINE HCL 10 MG PO TABS: 10.0000 mg | ORAL_TABLET | ORAL | 1 refills | Status: DC

## 2019-07-13 MED ORDER — BUTALBITAL-ACETAMINOPHEN 50-325 MG PO TABS: 1.0000 | ORAL_TABLET | Freq: Two times a day (BID) | ORAL | 1 refills | Status: DC | PRN

## 2019-07-13 MED ORDER — TOPIRAMATE 50 MG PO TABS: 50.0000 mg | ORAL_TABLET | Freq: Two times a day (BID) | ORAL | 3 refills | Status: DC

## 2019-07-13 NOTE — Telephone Encounter (Signed)
PA approved.   Request Reference Number: [id number]. BUTAL/APAP TAB 50-325MG  is approved through 11/09/2019. For further questions, call [phone]

## 2019-07-13 NOTE — Telephone Encounter (Signed)
PA initiated via Covermymeds; KEY: [id number]. Awaiting determination.

## 2019-07-13 NOTE — Patient Instructions (Addendum)
[facility] Blood pressure cuff, upper arm  Pulse oximeter check oxygen and want numbers in the 90s Check vitals weekly  Take the Famotidine liquid daily General Headache Without Cause A headache is pain or discomfort that is felt around the head or neck area. There are many causes and types of headaches. In some cases, the cause may not be found. Follow these instructions at home: Watch your condition for any changes. Let your doctor know about them. Take these steps to help with your condition: Managing pain      Take over-the-counter and prescription medicines only as told by your doctor.  Lie down in a dark, quiet room when you have a headache.  If told, put ice on your head and neck area: ? Put ice in a plastic bag. ? Place a towel between your skin and the bag. ? Leave the ice on for 20 minutes, 2-3 times per day.  If told, put heat on the affected area. Use the heat source that your doctor recommends, such as a moist heat pack or a heating pad. ? Place a towel between your skin and the heat source. ? Leave the heat on for 20-30 minutes. ? Remove the heat if your skin turns bright red. This is very important if you are unable to feel pain, heat, or cold. You may have a greater risk of getting burned.  Keep lights dim if bright lights bother you or make your headaches worse. Eating and drinking  Eat meals on a regular schedule.  If you drink alcohol: ? Limit how much you use to:  0-1 drink a day for women.  0-2 drinks a day for men. ? Be aware of how much alcohol is in your drink. In the [place] one drink equals one 12 oz bottle of beer (355 mL), one 5 oz glass of wine (148 mL), or one 1 oz glass of hard liquor (44 mL).  Stop drinking caffeine, or reduce how much caffeine you drink. General instructions   Keep a journal to find out if certain things bring on headaches. For example, write down: ? What you eat and drink. ? How much sleep you get. ? Any change to your diet  or medicines.  Get a massage or try other ways to relax.  Limit stress.  Sit up straight. Do not tighten (tense) your muscles.  Do not use any products that contain nicotine or tobacco. This includes cigarettes, e-cigarettes, and chewing tobacco. If you need help quitting, ask your doctor.  Exercise regularly as told by your doctor.  Get enough sleep. This often means 7-9 hours of sleep each night.  Keep all follow-up visits as told by your doctor. This is important. Contact a doctor if:  Your symptoms are not helped by medicine.  You have a headache that feels different than the other headaches.  You feel sick to your stomach (nauseous) or you throw up (vomit).  You have a fever. Get help right away if:  Your headache gets very bad quickly.  Your headache gets worse after a lot of physical activity.  You keep throwing up.  You have a stiff neck.  You have trouble seeing.  You have trouble speaking.  You have pain in the eye or ear.  Your muscles are weak or you lose muscle control.  You lose your balance or have trouble walking.  You feel like you will pass out (faint) or you pass out.  You are mixed up (confused).  You  have a seizure. Summary  A headache is pain or discomfort that is felt around the head or neck area.  There are many causes and types of headaches. In some cases, the cause may not be found.  Keep a journal to help find out what causes your headaches. Watch your condition for any changes. Let your doctor know about them.  Contact a doctor if you have a headache that is different from usual, or if your headache is not helped by medicine.  Get help right away if your headache gets very bad, you throw up, you have trouble seeing, you lose your balance, or you have a seizure. This information is not intended to replace advice given to you by your health care provider. Make sure you discuss any questions you have with your health care provider.  Document Released: 08/04/2008 Document Revised: 05/16/2018 Document Reviewed: 05/16/2018 Elsevier Patient Education  2020 [employer].

## 2019-07-13 NOTE — Telephone Encounter (Signed)
Dawn- Case manager  With [facility] calling [phone] ext [phone]  Pt in a monitoring program and her Diastolic pressures consistent 90's please reach out give parameters of  bp # ..when you want call back reporting

## 2019-07-14 ENCOUNTER — Encounter: Payer: Self-pay | Admitting: *Deleted

## 2019-07-15 LAB — CULTURE, BLOOD (ROUTINE X 2)
Culture: NO GROWTH
Culture: NO GROWTH
Special Requests: ADEQUATE
Special Requests: ADEQUATE

## 2019-07-15 LAB — URINE CULTURE
MICRO NUMBER:: 844508
SPECIMEN QUALITY:: ADEQUATE

## 2019-07-17 DIAGNOSIS — R519 Headache, unspecified: Secondary | ICD-10-CM | POA: Insufficient documentation

## 2019-07-17 DIAGNOSIS — R350 Frequency of micturition: Secondary | ICD-10-CM | POA: Insufficient documentation

## 2019-07-17 NOTE — Progress Notes (Signed)
Subjective:    [name], female    DOB: 06/24/1956, 63 y.o.   MRN: [id number]  No chief complaint on file.   HPI Patient is in today for follow up on chronic medical concerns including hypertension, hyperlipidemia and more. She is noting increase in headaches and she presented to ER with these headaches. They occur mostly on the top of her head and in her face, she denies any recent febrile ilnesss or trauma. Notes some photophobia, scotomata and nausea with the headaches. Denies CP/palp/SOB/congestion/fevers/GI or GU c/o. Taking meds as prescribed  Past Medical History:  Diagnosis Date  . Abnormal SPEP 07/26/2016  . Arthritis    knees, hands  . Back pain 07/14/2016  . Bowel obstruction ([place]) 11/2014  . C. difficile diarrhea   . CHF (congestive heart failure) ([place])   . Colitis   . Congestive heart failure ([place]) 02/24/2015   S/p pacemaker  . Depression   . Elevated sed rate 08/15/2017  . Epigastric pain 03/22/2017  . GERD (gastroesophageal reflux disease)   . Hyperlipidemia   . Hypertension   . 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 Low back pain 07/14/2016  . 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 Multiple myeloma ([place]) 07/20/2018  . Multiple myeloma not having achieved remission ([place]) 07/20/2018  . Myalgia 12/31/2016  . Obstructive sleep apnea 03/31/2015  . SOB (shortness of breath) 04/09/2016  . Stroke ([place])    TIAs  . UTI (urinary tract infection)     Past Surgical History:  Procedure Laterality Date  . ABDOMINAL HYSTERECTOMY     menorraghia, 2006, total  . CHOLECYSTECTOMY    . COLONOSCOPY  2018   [facility] per patient  . ESOPHAGOGASTRODUODENOSCOPY  2018   [facility]   . KNEE SURGERY     right, repair torn torn cartialage  . PACEMAKER INSERTION  08/2014  . TONSILLECTOMY    . TUBAL LIGATION      Family History  Problem Relation Age of Onset  . Diabetes Mother   . Hypertension Mother   . Heart  disease Mother        s/p 1 stent  . Hyperlipidemia Mother   . Arthritis Mother   . Cancer Father        COLON  . Colon cancer Father 15  . Irritable bowel syndrome Sister   . Hyperlipidemia Daughter   . Hypertension Daughter   . Hypertension Maternal Grandmother   . Arthritis Maternal Grandmother   . Heart disease Maternal Grandfather        MI  . Hypertension Maternal Grandfather   . Arthritis Maternal Grandfather   . Hypertension Son   . Esophageal cancer Neg Hx     Social History   Socioeconomic History  . Marital status: Divorced    Spouse name: [name]  . Number of children: 3  . Years of education: [name]  . Highest education level: [name]  Occupational History  . [name]  Social Needs  . Financial resource strain: [name]  . Food insecurity    Worry: [name]    Inability: [name]  . Transportation needs    Medical: [name]    Non-medical: [name]  Tobacco Use  . Smoking status: Never Smoker  . Smokeless tobacco: Never Used  Substance and Sexual Activity  . Alcohol use: No  . Drug use: No  . Sexual activity: Not  on file  Lifestyle  . Physical activity    Days per week: [name]    Minutes per session: [name]  . Stress: [name]  Relationships  . Social Herbalist on phone: [name]    Gets together: [name]    Attends religious service: [name]    Active member of club or organization: [name]    Attends meetings of clubs or organizations: [name]    Relationship status: [name]  . Intimate partner violence    Fear of current or ex partner: [name]    Emotionally abused: [name]    Physically abused: [name]    Forced sexual activity: [name]  Other Topics Concern  . [name]  Social History Narrative  . [name]    Outpatient Medications Prior to Visit  Medication Sig Dispense Refill  . acetaminophen (TYLENOL) 325 MG tablet Take 650 mg  by mouth.    Marland Kitchen albuterol (PROVENTIL HFA;VENTOLIN HFA) 108 (90 Base) MCG/ACT inhaler Inhale 2 puffs into the lungs every 6 (six) hours as needed for wheezing or shortness of breath. 1 Inhaler 2  . aspirin EC 81 MG tablet Take 1 tablet (81 mg total) by mouth daily. 90 tablet 1  . conjugated estrogens (PREMARIN) vaginal cream Place vaginally.    . cyclobenzaprine (FLEXERIL) 10 MG tablet Take 10 mg by mouth as needed.     Marland Kitchen dexamethasone (DECADRON) 4 MG tablet Take 5 pills at one time with food ONCE a WEEK!! 100 tablet 2  . famciclovir (FAMVIR) 250 MG tablet Take 1 tablet (250 mg total) by mouth daily. 30 tablet 12  . HYDROcodone-acetaminophen (NORCO/VICODIN) 5-325 MG tablet Take 1 tablet by mouth every 4 (four) hours as needed for moderate pain or severe pain. 20 tablet 0  . hyoscyamine (LEVSIN SL) 0.125 MG SL tablet Place 1 tablet (0.125 mg total) under the tongue every 4 (four) hours as needed. 30 tablet 2  . lidocaine (LIDODERM) 5 % Place 1 patch onto the skin daily. Remove & Discard patch within 12 hours or as directed by MD 30 patch 2  . Multiple Vitamins-Minerals (MULTIVITAMIN ADULT PO) Take by mouth.    . ondansetron (ZOFRAN) 4 MG tablet Take 1 tablet (4 mg total) by mouth every 8 (eight) hours as needed for nausea or vomiting. 30 tablet 1  . pantoprazole (PROTONIX) 40 MG tablet Take 1 tablet (40 mg total) by mouth 2 (two) times daily. 180 tablet 1  . pomalidomide (POMALYST) 2 MG capsule Take one capsule daily-Days #1-21. Repeat every 28 days. [id number] 21 capsule 0  . prochlorperazine (COMPAZINE) 10 MG tablet Take 1 tablet (10 mg total) by mouth every 6 (six) hours as needed (Nausea or vomiting). 30 tablet 1  . sucralfate (CARAFATE) 1 g tablet Take 1 tablet (1 g total) by mouth 4 (four) times daily -  with meals and at bedtime. 120 tablet 0  . alendronate (FOSAMAX) 70 MG tablet Take 70 mg by mouth daily.    Marland Kitchen amLODipine (NORVASC) 10 MG tablet Take 1 tablet (10 mg total) by mouth daily. 90  tablet 1  . amoxicillin-clavulanate (AUGMENTIN) 875-125 MG tablet Take 1 tablet by mouth every 12 (twelve) hours. 14 tablet 0  . carvedilol (COREG) 25 MG tablet Take 1 tablet (25 mg total) by mouth 2 (two) times daily with a meal. 180  tablet 1  . cetirizine (ZYRTEC) 10 MG tablet Take 1 tablet (10 mg total) by mouth every morning. 30 tablet 1  . dicyclomine (BENTYL) 20 MG tablet Take 1 tablet (20 mg total) by mouth 3 (three) times daily as needed for spasms. 90 tablet 1  . erythromycin ophthalmic ointment Place a 1/2 inch ribbon of ointment into the lower eyelid. 1 g 0  . famotidine (PEPCID) 20 MG tablet Take 1 tablet (20 mg total) by mouth 2 (two) times daily. 60 tablet 1  . famotidine (PEPCID) 40 MG/5ML suspension Take 5 mLs (40 mg total) by mouth at bedtime as needed for heartburn or indigestion. 150 mL 2  . fluticasone (FLONASE) 50 MCG/ACT nasal spray Place 2 sprays into both nostrils daily. 16 g 1  . levocetirizine (XYZAL) 5 MG tablet Take 1 tablet (5 mg total) by mouth every evening. 30 tablet 2  . mupirocin ointment (BACTROBAN) 2 % Apply 1 application topically 2 (two) times daily. 22 g 0  . potassium chloride SA (K-DUR,KLOR-CON) 20 MEQ tablet Take 2 tablets (40 mEq total) by mouth once for 1 dose. 60 tablet 3  . topiramate (TOPAMAX) 50 MG tablet Take 1 tablet (50 mg total) by mouth 2 (two) times daily. 60 tablet 3  . triamterene-hydrochlorothiazide (MAXZIDE-25) 37.5-25 MG tablet Take 1 tablet by mouth daily. 30 tablet 4  . trimethoprim-polymyxin b (POLYTRIM) ophthalmic solution Place 1 drop into both eyes 3 (three) times daily as needed. 10 mL 0   No facility-administered medications prior to visit.     Allergies  Allergen Reactions  . Benazepril Anaphylaxis and Swelling    angioedema Throat and lip swelling  . Ondansetron Hcl Hives    Redness and hives post IV admin on 07/05/17  . Codeine Nausea And Vomiting  . Morphine Hives    Redness and hives noted post IV admin on 07/05/17     Review of Systems  Constitutional: Positive for malaise/fatigue. Negative for fever.  HENT: Negative for congestion.   Eyes: Positive for photophobia. Negative for blurred vision, pain, discharge and redness.  Respiratory: Negative for shortness of breath.   Cardiovascular: Negative for chest pain, palpitations and leg swelling.  Gastrointestinal: Positive for nausea. Negative for abdominal pain, blood in stool and vomiting.  Genitourinary: Negative for dysuria and frequency.  Musculoskeletal: Negative for falls.  Skin: Negative for rash.  Neurological: Positive for headaches. Negative for dizziness and loss of consciousness.  Endo/Heme/Allergies: Negative for environmental allergies.  Psychiatric/Behavioral: Negative for depression. The patient is not nervous/anxious.        Objective:    Physical Exam  BP 118/84 (BP Location: Left Arm, Patient Position: Sitting, Cuff Size: Normal)   Pulse 99   Temp 97.8 F (36.6 C) (Oral)   Resp 18   Wt 189 lb (85.7 kg)   SpO2 96%   BMI 30.51 kg/m  Wt Readings from Last 3 Encounters:  07/13/19 189 lb (85.7 kg)  07/10/19 190 lb (86.2 kg)  06/16/19 186 lb 12.8 oz (84.7 kg)    Diabetic Foot Exam - Simple   No data filed     Lab Results  Component Value Date   WBC 4.3 07/13/2019   HGB 12.1 07/13/2019   HCT 37.5 07/13/2019   PLT 148.0 (L) 07/13/2019   GLUCOSE 97 07/13/2019   CHOL 200 07/13/2019   TRIG 146.0 07/13/2019   HDL 66.00 07/13/2019   LDLCALC 104 (H) 07/13/2019   ALT 13 07/13/2019   AST 17 07/13/2019  NA 140 07/13/2019   K 4.0 07/13/2019   CL 103 07/13/2019   CREATININE 0.90 07/13/2019   BUN 21 07/13/2019   CO2 29 07/13/2019   TSH 0.93 07/13/2019   INR 0.90 07/28/2018   HGBA1C 5.6 07/13/2019    Lab Results  Component Value Date   TSH 0.93 07/13/2019   Lab Results  Component Value Date   WBC 4.3 07/13/2019   HGB 12.1 07/13/2019   HCT 37.5 07/13/2019   MCV 92.2 07/13/2019   PLT 148.0 (L) 07/13/2019    Lab Results  Component Value Date   NA 140 07/13/2019   K 4.0 07/13/2019   CHLORIDE 108 12/31/2016   CO2 29 07/13/2019   GLUCOSE 97 07/13/2019   BUN 21 07/13/2019   CREATININE 0.90 07/13/2019   BILITOT 0.5 07/13/2019   ALKPHOS 43 07/13/2019   AST 17 07/13/2019   ALT 13 07/13/2019   PROT 7.6 07/13/2019   ALBUMIN 3.9 07/13/2019   CALCIUM 9.3 07/13/2019   ANIONGAP 8 07/10/2019   EGFR >90 12/31/2016   GFR 76.01 07/13/2019   Lab Results  Component Value Date   CHOL 200 07/13/2019   Lab Results  Component Value Date   HDL 66.00 07/13/2019   Lab Results  Component Value Date   LDLCALC 104 (H) 07/13/2019   Lab Results  Component Value Date   TRIG 146.0 07/13/2019   Lab Results  Component Value Date   CHOLHDL 3 07/13/2019   Lab Results  Component Value Date   HGBA1C 5.6 07/13/2019       Assessment & Plan:   Problem List Items Addressed This Visit    Multiple myeloma not having achieved remission ([place]) (Chronic)    Following with oncology.       Hyperlipidemia    encouraged heart healthy diet, avoid trans fats, minimize simple carbs and saturated fats. Increase exercise as tolerated      Relevant Medications   carvedilol (COREG) 25 MG tablet   triamterene-hydrochlorothiazide (MAXZIDE-25) 37.5-25 MG tablet   furosemide (LASIX) 20 MG tablet   Other Relevant Orders   Lipid panel (Completed)   Hypertension    Well controlled, no changes to meds. Encouraged heart healthy diet such as the DASH diet and exercise as tolerated.       Relevant Medications   carvedilol (COREG) 25 MG tablet   triamterene-hydrochlorothiazide (MAXZIDE-25) 37.5-25 MG tablet   furosemide (LASIX) 20 MG tablet   Other Relevant Orders   Comprehensive metabolic panel (Completed)   CBC with Differential/Platelet (Completed)   TSH (Completed)   Seasonal allergies   Conjunctivitis   Urinary frequency    Urine culture is negative.       Headache    Worsening headaches with more  frequency and intensity. She reports headaches concentrating on the top of her head, facial. Is started on Bupap, APAP and referred to neurology for further consideration. She does note some nausea, photophobia and scotomata. Suggestive of migraine      Relevant Medications   carvedilol (COREG) 25 MG tablet   topiramate (TOPAMAX) 50 MG tablet   ACETAMINOPHEN-BUTALBITAL 50-325 MG TABS   Other Relevant Orders   Ambulatory referral to Neurology    Other Visit Diagnoses    Needs flu shot    -  Primary   Relevant Orders   Flu Vaccine QUAD High Dose(Fluad) (Completed)   Urinary hesitancy       Relevant Orders   Urinalysis   Urine Culture (Completed)   Hyperglycemia  Relevant Orders   Hemoglobin A1c (Completed)      I have discontinued Lannette Donath dicyclomine, amLODipine, trimethoprim-polymyxin b, mupirocin ointment, erythromycin, alendronate, amoxicillin-clavulanate, and amLODipine. I have also changed her potassium chloride SA. Additionally, I am having her start on ACETAMINOPHEN-BUTALBITAL and furosemide. Lastly, I am having her maintain her conjugated estrogens, acetaminophen, pantoprazole, aspirin EC, ondansetron, lidocaine, sucralfate, famciclovir, hyoscyamine, dexamethasone, albuterol, cyclobenzaprine, Multiple Vitamins-Minerals (MULTIVITAMIN ADULT PO), HYDROcodone-acetaminophen, prochlorperazine, pomalidomide, carvedilol, topiramate, cetirizine, triamterene-hydrochlorothiazide, famotidine, and fluticasone.  Meds ordered this encounter  Medications  . carvedilol (COREG) 25 MG tablet    Sig: Take 1 tablet (25 mg total) by mouth 2 (two) times daily with a meal.    Dispense:  180 tablet    Refill:  1  . topiramate (TOPAMAX) 50 MG tablet    Sig: Take 1 tablet (50 mg total) by mouth 2 (two) times daily.    Dispense:  60 tablet    Refill:  3  . cetirizine (ZYRTEC) 10 MG tablet    Sig: Take 1 tablet (10 mg total) by mouth every morning.    Dispense:  30 tablet    Refill:  1   . DISCONTD: amLODipine (NORVASC) 10 MG tablet    Sig: Take 1 tablet (10 mg total) by mouth daily.    Dispense:  90 tablet    Refill:  1  . triamterene-hydrochlorothiazide (MAXZIDE-25) 37.5-25 MG tablet    Sig: Take 1 tablet by mouth daily.    Dispense:  30 tablet    Refill:  4  . famotidine (PEPCID) 40 MG/5ML suspension    Sig: Take 5 mLs (40 mg total) by mouth at bedtime as needed for heartburn or indigestion.    Dispense:  150 mL    Refill:  2  . fluticasone (FLONASE) 50 MCG/ACT nasal spray    Sig: Place 2 sprays into both nostrils daily.    Dispense:  16 g    Refill:  1  . potassium chloride SA (K-DUR) 20 MEQ tablet    Sig: Take 2 tablets (40 mEq total) by mouth once for 1 dose.    Dispense:  60 tablet    Refill:  3  . ACETAMINOPHEN-BUTALBITAL 50-325 MG TABS    Sig: Take 1 tablet by mouth 2 (two) times daily as needed.    Dispense:  120 tablet    Refill:  1  . furosemide (LASIX) 20 MG tablet    Sig: Take 1 tablet (20 mg total) by mouth daily as needed for fluid or edema.    Dispense:  30 tablet    Refill:  2     Penni Homans, MD

## 2019-07-17 NOTE — Assessment & Plan Note (Signed)
Well controlled, no changes to meds. Encouraged heart healthy diet such as the DASH diet and exercise as tolerated.  °

## 2019-07-17 NOTE — Assessment & Plan Note (Addendum)
Worsening headaches with more frequency and intensity. She reports headaches concentrating on the top of her head, facial. Is started on Bupap, APAP and referred to neurology for further consideration. She does note some nausea, photophobia and scotomata. Suggestive of migraine

## 2019-07-17 NOTE — Assessment & Plan Note (Signed)
Following with oncology 

## 2019-07-17 NOTE — Assessment & Plan Note (Signed)
encouraged heart healthy diet, avoid trans fats, minimize simple carbs and saturated fats. Increase exercise as tolerated 

## 2019-07-17 NOTE — Assessment & Plan Note (Signed)
Urine culture is negative

## 2019-07-19 ENCOUNTER — Other Ambulatory Visit: Payer: Self-pay | Admitting: *Deleted

## 2019-07-19 DIAGNOSIS — C9000 Multiple myeloma not having achieved remission: Secondary | ICD-10-CM

## 2019-07-19 MED ORDER — POMALIDOMIDE 2 MG PO CAPS: ORAL_CAPSULE | ORAL | 0 refills | Status: DC

## 2019-07-19 NOTE — Telephone Encounter (Signed)
Please advise on any recommendations

## 2019-07-19 NOTE — Telephone Encounter (Signed)
Her last blood pressures here were good. Please confirm she is taking her bp meds as directed and stay guiet for 10 minutes prior to checking it. Then report numbers and we can consider changing meds.

## 2019-07-20 NOTE — Telephone Encounter (Signed)
Patient notified of PCP recommendations and is agreement and expresses an understanding. Will call Monday and let [place] know the readings.   [place] for [facility] to Discuss results / PCP recommendations / Schedule patient.

## 2019-07-21 ENCOUNTER — Inpatient Hospital Stay: Payer: Medicare Other

## 2019-07-21 ENCOUNTER — Inpatient Hospital Stay: Payer: Medicare Other | Attending: Hematology & Oncology | Admitting: Hematology & Oncology

## 2019-07-21 ENCOUNTER — Other Ambulatory Visit: Payer: Self-pay

## 2019-07-21 ENCOUNTER — Encounter: Payer: Self-pay | Admitting: Hematology & Oncology

## 2019-07-21 VITALS — BP 134/98 | HR 95 | Temp 97.8°F | Resp 18 | Wt 191.0 lb

## 2019-07-21 DIAGNOSIS — Z5112 Encounter for antineoplastic immunotherapy: Secondary | ICD-10-CM | POA: Insufficient documentation

## 2019-07-21 DIAGNOSIS — C9000 Multiple myeloma not having achieved remission: Secondary | ICD-10-CM

## 2019-07-21 LAB — CBC WITH DIFFERENTIAL (CANCER CENTER ONLY)
Abs Immature Granulocytes: 0.01 10*3/uL (ref 0.00–0.07)
Basophils Absolute: 0 10*3/uL (ref 0.0–0.1)
Basophils Relative: 1 %
Eosinophils Absolute: 0.1 10*3/uL (ref 0.0–0.5)
Eosinophils Relative: 2 %
HCT: 34.8 % — ABNORMAL LOW (ref 36.0–46.0)
Hemoglobin: 11.3 g/dL — ABNORMAL LOW (ref 12.0–15.0)
Immature Granulocytes: 0 %
Lymphocytes Relative: 38 %
Lymphs Abs: 1.4 10*3/uL (ref 0.7–4.0)
MCH: 29.7 pg (ref 26.0–34.0)
MCHC: 32.5 g/dL (ref 30.0–36.0)
MCV: 91.6 fL (ref 80.0–100.0)
Monocytes Absolute: 0.4 10*3/uL (ref 0.1–1.0)
Monocytes Relative: 10 %
Neutro Abs: 1.8 10*3/uL (ref 1.7–7.7)
Neutrophils Relative %: 49 %
Platelet Count: 146 10*3/uL — ABNORMAL LOW (ref 150–400)
RBC: 3.8 MIL/uL — ABNORMAL LOW (ref 3.87–5.11)
RDW: 14 % (ref 11.5–15.5)
WBC Count: 3.6 10*3/uL — ABNORMAL LOW (ref 4.0–10.5)
nRBC: 0 % (ref 0.0–0.2)

## 2019-07-21 LAB — CMP (CANCER CENTER ONLY)
ALT: 11 U/L (ref 0–44)
AST: 16 U/L (ref 15–41)
Albumin: 3.8 g/dL (ref 3.5–5.0)
Alkaline Phosphatase: 48 U/L (ref 38–126)
Anion gap: 9 (ref 5–15)
BUN: 23 mg/dL (ref 8–23)
CO2: 25 mmol/L (ref 22–32)
Calcium: 9.4 mg/dL (ref 8.9–10.3)
Chloride: 106 mmol/L (ref 98–111)
Creatinine: 0.8 mg/dL (ref 0.44–1.00)
GFR, Est AFR Am: >60 mL/min (ref >60)
GFR, Estimated: >60 mL/min (ref >60)
Glucose, Bld: 151 mg/dL — ABNORMAL HIGH (ref 70–99)
Potassium: 3.2 mmol/L — ABNORMAL LOW (ref 3.5–5.1)
Sodium: 140 mmol/L (ref 135–145)
Total Bilirubin: 0.4 mg/dL (ref 0.3–1.2)
Total Protein: 7.5 g/dL (ref 6.5–8.1)

## 2019-07-21 MED ORDER — BORTEZOMIB CHEMO SQ INJECTION 3.5 MG (2.5MG/ML)
1.3000 mg/m2 | Freq: Once | INTRAMUSCULAR | Status: AC
  Administered 2019-07-21: 2.5 mg via SUBCUTANEOUS
  Filled 2019-07-21: qty 1

## 2019-07-21 MED ORDER — PROCHLORPERAZINE MALEATE 10 MG PO TABS: 10.0000 mg | ORAL_TABLET | Freq: Once | ORAL | Status: DC

## 2019-07-21 NOTE — Progress Notes (Signed)
Hematology and Oncology Follow Up Visit  Tami [name] [id number] 10/28/1956 63 y.o. 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  So far, things are going pretty well for her.  She seems to be doing okay with the Velcade/pomalidomide combination.  Her last myeloma studies that we did about a month ago showed an M spike that is worse stabilized.  The M spike was 0.8 g/dL. Her IgG level was a little bit better at 1730 mg/dL.  Her kappa light chain was down to 3.9 mg/dL.  For right now, I probably would not make any changes with her protocol as she just has a low level of myeloma and is asymptomatic.Marland Kitchen  She is tolerating her treatments well.  There is no abdominal issues.  She is having no diarrhea.  She is planning on going to yard sales this weekend.  She loves to go on the yard cells.    Overall, her performance status is ECOG 1.  Medications:  Allergies as of 07/21/2019      Reactions   Benazepril Anaphylaxis, Swelling   angioedema Throat and lip swelling   Ondansetron Hcl Hives   Redness and hives post IV admin on 07/05/17   Codeine Nausea And Vomiting   Morphine Hives   Redness and hives noted post IV admin on 07/05/17      Medication List       Accurate as of July 21, 2019  2:29 PM. If you have any questions, ask your nurse or doctor.        acetaminophen 325 MG tablet Commonly known as: TYLENOL Take 650 mg by mouth.   ACETAMINOPHEN-BUTALBITAL 50-325 MG Tabs Take 1 tablet by mouth 2 (two) times daily as needed.   albuterol 108 (90 Base) MCG/ACT inhaler Commonly known as: VENTOLIN HFA Inhale 2 puffs into the lungs every 6 (six) hours as needed for wheezing or shortness of breath.   amLODipine 10 MG tablet  Commonly known as: NORVASC Take 10 mg by mouth daily.   ARTIFICIAL TEARS OP Apply to eye.   aspirin EC 81 MG tablet Take 1 tablet (81 mg total) by mouth daily.   carvedilol 25 MG tablet Commonly known as: COREG Take 1 tablet (25 mg total) by mouth 2 (two) times daily with a meal.   cetirizine 10 MG tablet Commonly known as: ZYRTEC Take 1 tablet (10 mg total) by mouth every morning.   cyclobenzaprine 10 MG tablet Commonly known as: FLEXERIL Take 10 mg by mouth as needed.   dexamethasone 4 MG tablet Commonly known as: DECADRON Take 5 pills at one time with food ONCE a WEEK!!   famciclovir 250 MG tablet Commonly known as: FAMVIR Take 1 tablet (250 mg total) by mouth daily.   famotidine 40 MG/5ML suspension Commonly known as: PEPCID Take 5 mLs (40 mg total) by mouth at bedtime as needed for heartburn or indigestion.   fluticasone 50 MCG/ACT nasal spray Commonly known as: FLONASE Place 2 sprays into both nostrils daily.   furosemide 20 MG tablet Commonly known as: LASIX Take 1 tablet (20 mg total) by mouth daily as needed for fluid or edema.   HYDROcodone-acetaminophen 5-325 MG tablet Commonly known as: NORCO/VICODIN Take 1 tablet by mouth every 4 (four)  hours as needed for moderate pain or severe pain.   hyoscyamine 0.125 MG SL tablet Commonly known as: LEVSIN SL Place 1 tablet (0.125 mg total) under the tongue every 4 (four) hours as needed.   lidocaine 5 % Commonly known as: Lidoderm Place 1 patch onto the skin daily. Remove & Discard patch within 12 hours or as directed by MD   MULTIVITAMIN ADULT PO Take by mouth.   ondansetron 4 MG tablet Commonly known as: Zofran Take 1 tablet (4 mg total) by mouth every 8 (eight) hours as needed for nausea or vomiting.   pantoprazole 40 MG tablet Commonly known as: PROTONIX Take 1 tablet (40 mg total) by mouth 2 (two) times daily.   pomalidomide 2 MG capsule Commonly known as: POMALYST Take one capsule daily-Days  #1-21. Repeat every 28 days. [id number]   potassium chloride SA 20 MEQ tablet Commonly known as: K-DUR Take 2 tablets (40 mEq total) by mouth once for 1 dose.   Premarin vaginal cream Generic drug: conjugated estrogens Place vaginally.   prochlorperazine 10 MG tablet Commonly known as: COMPAZINE Take 1 tablet (10 mg total) by mouth every 6 (six) hours as needed (Nausea or vomiting).   sucralfate 1 g tablet Commonly known as: Carafate Take 1 tablet (1 g total) by mouth 4 (four) times daily -  with meals and at bedtime.   topiramate 50 MG tablet Commonly known as: Topamax Take 1 tablet (50 mg total) by mouth 2 (two) times daily.   triamterene-hydrochlorothiazide 37.5-25 MG tablet Commonly known as: Maxzide-25 Take 1 tablet by mouth daily.       Allergies:  Allergies  Allergen Reactions  . Benazepril Anaphylaxis and Swelling    angioedema Throat and lip swelling  . Ondansetron Hcl Hives    Redness and hives post IV admin on 07/05/17  . Codeine Nausea And Vomiting  . Morphine Hives    Redness and hives noted post IV admin on 07/05/17    Past Medical History, Surgical history, Social history, and Family History were reviewed and updated.  Review of Systems: Review of Systems  Constitutional: Negative.   HENT: Negative.   Eyes: Negative.   Respiratory: Negative.   Cardiovascular: Negative.   Gastrointestinal: Negative.   Genitourinary: Negative.   Musculoskeletal: Negative.   Skin: Positive for rash.  Neurological: Negative.   Endo/Heme/Allergies: Negative.   Psychiatric/Behavioral: Negative.      Physical Exam:  weight is 191 lb (86.6 kg). Her temporal temperature is 97.8 F (36.6 C). Her blood pressure is 134/98 (abnormal) and her pulse is 95. Her respiration is 18 and oxygen saturation is 100%.   Wt Readings from Last 3 Encounters:  07/21/19 191 lb (86.6 kg)  07/13/19 189 lb (85.7 kg)  07/10/19 190 lb (86.2 kg)    Physical Exam Vitals signs  reviewed.  HENT:     Head: Normocephalic and atraumatic.  Eyes:     Pupils: Pupils are equal, round, and reactive to light.  Neck:     Musculoskeletal: Normal range of motion.  Cardiovascular:     Rate and Rhythm: Normal rate and regular rhythm.     Heart sounds: Normal heart sounds.  Pulmonary:     Effort: Pulmonary effort is normal.     Breath sounds: Normal breath sounds.  Abdominal:     General: Bowel sounds are normal.     Palpations: Abdomen is soft.  Musculoskeletal: Normal range of motion.        General: No tenderness or  deformity.  Lymphadenopathy:     Cervical: No cervical adenopathy.  Skin:    General: Skin is warm and dry.     Findings: No erythema or rash.  Neurological:     Mental Status: She is alert and oriented to person, place, and time.  Psychiatric:        Behavior: Behavior normal.        Thought Content: Thought content normal.        Judgment: Judgment normal.      Lab Results  Component Value Date   WBC 3.6 (L) 07/21/2019   HGB 11.3 (L) 07/21/2019   HCT 34.8 (L) 07/21/2019   MCV 91.6 07/21/2019   PLT 146 (L) 07/21/2019   Lab Results  Component Value Date   FERRITIN 169 09/16/2016   IRON 59 09/16/2016   TIBC 281 09/16/2016   UIBC 222 09/16/2016   IRONPCTSAT 21 09/16/2016   Lab Results  Component Value Date   RETICCTPCT 0.8 07/16/2016   RBC 3.80 (L) 07/21/2019   RETICCTABS 32,640 07/16/2016   Lab Results  Component Value Date   KPAFRELGTCHN 38.6 (H) 06/16/2019   LAMBDASER 7.3 06/16/2019   KAPLAMBRATIO 5.29 (H) 06/16/2019   Lab Results  Component Value Date   IGGSERUM 1,727 (H) 06/16/2019   IGGSERUM 2,166 (H) 06/16/2019   IGA 181 06/16/2019   IGA 201 06/16/2019   IGMSERUM 78 06/16/2019   IGMSERUM 89 06/16/2019   Lab Results  Component Value Date   TOTALPROTELP 7.3 06/16/2019   ALBUMINELP 3.5 06/16/2019   A1GS 0.2 06/16/2019   A2GS 0.9 06/16/2019   BETS 1.0 06/16/2019   BETA2SER 0.5 07/16/2016   GAMS 1.7 06/16/2019    MSPIKE 0.8 (H) 06/16/2019   SPEI Comment 10/07/2018     Chemistry      Component Value Date/Time   NA 140 07/21/2019 1350   NA 143 11/08/2017 1127   NA 140 12/31/2016 1000   K 3.2 (L) 07/21/2019 1350   K 4.0 11/08/2017 1127   K 3.6 12/31/2016 1000   CL 106 07/21/2019 1350   CL 107 11/08/2017 1127   CO2 25 07/21/2019 1350   CO2 25 11/08/2017 1127   CO2 24 12/31/2016 1000   BUN 23 07/21/2019 1350   BUN 19 11/08/2017 1127   BUN 22.3 12/31/2016 1000   CREATININE 0.80 07/21/2019 1350   CREATININE 0.9 11/08/2017 1127   CREATININE 0.8 12/31/2016 1000      Component Value Date/Time   CALCIUM 9.4 07/21/2019 1350   CALCIUM 9.9 11/08/2017 1127   CALCIUM 9.5 12/31/2016 1000   ALKPHOS 48 07/21/2019 1350   ALKPHOS 44 11/08/2017 1127   ALKPHOS 51 12/31/2016 1000   AST 16 07/21/2019 1350   AST 28 12/31/2016 1000   ALT 11 07/21/2019 1350   ALT 36 11/08/2017 1127   ALT 16 12/31/2016 1000   BILITOT 0.4 07/21/2019 1350   BILITOT 0.46 12/31/2016 1000      Impression and Plan: Tami [name] is a very pleasant 63 yo African American female with a previous IgG kappa MGUS with progression to myeloma.   We will have to watch her myeloma levels closely.  If we find that things are progressing, we may have to make a change with her protocol.  I will plan to see her back in October when she starts her next 3-week cycle of treatment.   [name], MD 9/11/20202:29 PM

## 2019-07-21 NOTE — Patient Instructions (Signed)
Bortezomib injection What is this medicine? BORTEZOMIB (bor TEZ oh mib) is a medicine that targets proteins in cancer cells and stops the cancer cells from growing. It is used to treat multiple myeloma and mantle-cell lymphoma. This medicine may be used for other purposes; ask your health care provider or pharmacist if you have questions. COMMON BRAND NAME(S): Velcade What should I tell my health care provider before I take this medicine? They need to know if you have any of these conditions:  diabetes  heart disease  irregular heartbeat  liver disease  on hemodialysis  low blood counts, like low white blood cells, platelets, or hemoglobin  peripheral neuropathy  taking medicine for blood pressure  an unusual or allergic reaction to bortezomib, mannitol, boron, other medicines, foods, dyes, or preservatives  pregnant or trying to get pregnant  breast-feeding How should I use this medicine? This medicine is for injection into a vein or for injection under the skin. It is given by a health care professional in a hospital or clinic setting. Talk to your pediatrician regarding the use of this medicine in children. Special care may be needed. Overdosage: If you think you have taken too much of this medicine contact a poison control center or emergency room at once. NOTE: This medicine is only for you. Do not share this medicine with others. What if I miss a dose? It is important not to miss your dose. Call your doctor or health care professional if you are unable to keep an appointment. What may interact with this medicine? This medicine may interact with the following medications:  ketoconazole  rifampin  ritonavir  St. John's Wort This list may not describe all possible interactions. Give your health care provider a list of all the medicines, herbs, non-prescription drugs, or dietary supplements you use. Also tell them if you smoke, drink alcohol, or use illegal drugs. Some  items may interact with your medicine. What should I watch for while using this medicine? You may get drowsy or dizzy. Do not drive, use machinery, or do anything that needs mental alertness until you know how this medicine affects you. Do not stand or sit up quickly, especially if you are an older patient. This reduces the risk of dizzy or fainting spells. In some cases, you may be given additional medicines to help with side effects. Follow all directions for their use. Call your doctor or health care professional for advice if you get a fever, chills or sore throat, or other symptoms of a cold or flu. Do not treat yourself. This drug decreases your body's ability to fight infections. Try to avoid being around people who are sick. This medicine may increase your risk to bruise or bleed. Call your doctor or health care professional if you notice any unusual bleeding. You may need blood work done while you are taking this medicine. In some patients, this medicine may cause a serious brain infection that may cause death. If you have any problems seeing, thinking, speaking, walking, or standing, tell your doctor right away. If you cannot reach your doctor, urgently seek other source of medical care. Check with your doctor or health care professional if you get an attack of severe diarrhea, nausea and vomiting, or if you sweat a lot. The loss of too much body fluid can make it dangerous for you to take this medicine. Do not become pregnant while taking this medicine or for at least 7 months after stopping it. Women should inform their doctor   if they wish to become pregnant or think they might be pregnant. Men should not father a child while taking this medicine and for at least 4 months after stopping it. There is a potential for serious side effects to an unborn child. Talk to your health care professional or pharmacist for more information. Do not breast-feed an infant while taking this medicine or for 2  months after stopping it. This medicine may interfere with the ability to have a child. You should talk with your doctor or health care professional if you are concerned about your fertility. What side effects may I notice from receiving this medicine? Side effects that you should report to your doctor or health care professional as soon as possible:  allergic reactions like skin rash, itching or hives, swelling of the face, lips, or tongue  breathing problems  changes in hearing  changes in vision  fast, irregular heartbeat  feeling faint or lightheaded, falls  pain, tingling, numbness in the hands or feet  right upper belly pain  seizures  swelling of the ankles, feet, hands  unusual bleeding or bruising  unusually weak or tired  vomiting  yellowing of the eyes or skin Side effects that usually do not require medical attention (report to your doctor or health care professional if they continue or are bothersome):  changes in emotions or moods  constipation  diarrhea  loss of appetite  headache  irritation at site where injected  nausea This list may not describe all possible side effects. Call your doctor for medical advice about side effects. You may report side effects to FDA at 1-800-FDA-1088. Where should I keep my medicine? This drug is given in a hospital or clinic and will not be stored at home. NOTE: This sheet is a summary. It may not cover all possible information. If you have questions about this medicine, talk to your doctor, pharmacist, or health care provider.  2020 [employer] (2018-03-07 16:29:31)  

## 2019-07-22 LAB — IGG, IGA, IGM
IgA: 173 mg/dL (ref 87–352)
IgG (Immunoglobin G), Serum: 1754 mg/dL — ABNORMAL HIGH (ref 586–1602)
IgM (Immunoglobulin M), Srm: 78 mg/dL (ref 26–217)

## 2019-07-23 LAB — KAPPA/LAMBDA LIGHT CHAINS
Kappa free light chain: 42.4 mg/L — ABNORMAL HIGH (ref 3.3–19.4)
Kappa, lambda light chain ratio: 4.04 — ABNORMAL HIGH (ref 0.26–1.65)
Lambda free light chains: 10.5 mg/L (ref 5.7–26.3)

## 2019-07-24 DIAGNOSIS — J40 Bronchitis, not specified as acute or chronic: Secondary | ICD-10-CM | POA: Diagnosis not present

## 2019-07-24 LAB — PROTEIN ELECTROPHORESIS, SERUM
A/G Ratio: 0.9 (ref 0.7–1.7)
Albumin ELP: 3.4 g/dL (ref 2.9–4.4)
Alpha-1-Globulin: 0.2 g/dL (ref 0.0–0.4)
Alpha-2-Globulin: 1 g/dL (ref 0.4–1.0)
Beta Globulin: 1 g/dL (ref 0.7–1.3)
Gamma Globulin: 1.6 g/dL (ref 0.4–1.8)
Globulin, Total: 3.7 g/dL (ref 2.2–3.9)
M-Spike, %: 0.5 g/dL — ABNORMAL HIGH
Total Protein ELP: 7.1 g/dL (ref 6.0–8.5)

## 2019-07-24 NOTE — Telephone Encounter (Signed)
Pt called to give readings:  All readings done between 7 and 7:30am.  07/21/2019 - 139/87, pulse 70 07/22/2019 - 138/80, pulse 81 07/23/2019 - 159/92, pulse 75 07/24/2019 - 150/75, pulse 76

## 2019-07-25 ENCOUNTER — Telehealth: Payer: Self-pay | Admitting: *Deleted

## 2019-07-25 DIAGNOSIS — I509 Heart failure, unspecified: Secondary | ICD-10-CM | POA: Diagnosis not present

## 2019-07-25 DIAGNOSIS — G4733 Obstructive sleep apnea (adult) (pediatric): Secondary | ICD-10-CM | POA: Diagnosis not present

## 2019-07-25 DIAGNOSIS — Z8673 Personal history of transient ischemic attack (TIA), and cerebral infarction without residual deficits: Secondary | ICD-10-CM | POA: Diagnosis not present

## 2019-07-25 DIAGNOSIS — I10 Essential (primary) hypertension: Secondary | ICD-10-CM | POA: Diagnosis not present

## 2019-07-25 NOTE — Telephone Encounter (Signed)
Make sure she is taking her Amlodipine at night and if she is not have her move it. No further changes yet. Continue to monitor bp and let us know numbers in about 2 weeks or sooner if they spike high

## 2019-07-25 NOTE — Telephone Encounter (Signed)
Left message for patient to notify her per order of Dr. Marin Olp that "the myeloma protein is down to 0.5!!!  Great job!!"  Instructed pt to call office back with any questions or concerns.

## 2019-07-25 NOTE — Telephone Encounter (Signed)
-----   Message from [name], MD sent at 07/24/2019  5:28 PM EDT ----- Call- the myeloma protein is down to 0.5!!!  Great job!!  Laurey Arrow

## 2019-07-26 NOTE — Telephone Encounter (Signed)
Patient notified of PCP notes and agreed to do.  She will call us back in about 2 weeks.

## 2019-07-27 ENCOUNTER — Other Ambulatory Visit: Payer: Self-pay | Admitting: *Deleted

## 2019-07-27 DIAGNOSIS — C9000 Multiple myeloma not having achieved remission: Secondary | ICD-10-CM

## 2019-07-28 ENCOUNTER — Inpatient Hospital Stay: Payer: Medicare Other

## 2019-07-28 ENCOUNTER — Other Ambulatory Visit: Payer: Self-pay

## 2019-07-28 VITALS — BP 146/79 | HR 84 | Temp 97.5°F | Resp 16

## 2019-07-28 DIAGNOSIS — Z5112 Encounter for antineoplastic immunotherapy: Secondary | ICD-10-CM | POA: Diagnosis not present

## 2019-07-28 DIAGNOSIS — C9000 Multiple myeloma not having achieved remission: Secondary | ICD-10-CM

## 2019-07-28 LAB — CBC WITH DIFFERENTIAL (CANCER CENTER ONLY)
Abs Immature Granulocytes: 0.01 10*3/uL (ref 0.00–0.07)
Basophils Absolute: 0 10*3/uL (ref 0.0–0.1)
Basophils Relative: 1 %
Eosinophils Absolute: 0.1 10*3/uL (ref 0.0–0.5)
Eosinophils Relative: 2 %
HCT: 35.3 % — ABNORMAL LOW (ref 36.0–46.0)
Hemoglobin: 11.2 g/dL — ABNORMAL LOW (ref 12.0–15.0)
Immature Granulocytes: 0 %
Lymphocytes Relative: 37 %
Lymphs Abs: 1.2 10*3/uL (ref 0.7–4.0)
MCH: 29.6 pg (ref 26.0–34.0)
MCHC: 31.7 g/dL (ref 30.0–36.0)
MCV: 93.4 fL (ref 80.0–100.0)
Monocytes Absolute: 0.4 10*3/uL (ref 0.1–1.0)
Monocytes Relative: 12 %
Neutro Abs: 1.6 10*3/uL — ABNORMAL LOW (ref 1.7–7.7)
Neutrophils Relative %: 48 %
Platelet Count: 148 10*3/uL — ABNORMAL LOW (ref 150–400)
RBC: 3.78 MIL/uL — ABNORMAL LOW (ref 3.87–5.11)
RDW: 14.1 % (ref 11.5–15.5)
WBC Count: 3.3 10*3/uL — ABNORMAL LOW (ref 4.0–10.5)
nRBC: 0 % (ref 0.0–0.2)

## 2019-07-28 LAB — CMP (CANCER CENTER ONLY)
ALT: 14 U/L (ref 0–44)
AST: 19 U/L (ref 15–41)
Albumin: 3.6 g/dL (ref 3.5–5.0)
Alkaline Phosphatase: 40 U/L (ref 38–126)
Anion gap: 6 (ref 5–15)
BUN: 19 mg/dL (ref 8–23)
CO2: 27 mmol/L (ref 22–32)
Calcium: 9.3 mg/dL (ref 8.9–10.3)
Chloride: 108 mmol/L (ref 98–111)
Creatinine: 0.83 mg/dL (ref 0.44–1.00)
GFR, Est AFR Am: >60 mL/min (ref >60)
GFR, Estimated: >60 mL/min (ref >60)
Glucose, Bld: 103 mg/dL — ABNORMAL HIGH (ref 70–99)
Potassium: 3.5 mmol/L (ref 3.5–5.1)
Sodium: 141 mmol/L (ref 135–145)
Total Bilirubin: 0.3 mg/dL (ref 0.3–1.2)
Total Protein: 7.2 g/dL (ref 6.5–8.1)

## 2019-07-28 MED ORDER — BORTEZOMIB CHEMO SQ INJECTION 3.5 MG (2.5MG/ML)
1.3000 mg/m2 | Freq: Once | INTRAMUSCULAR | Status: AC
  Administered 2019-07-28: 15:00:00 2.5 mg via SUBCUTANEOUS
  Filled 2019-07-28: qty 1

## 2019-07-28 MED ORDER — PROCHLORPERAZINE MALEATE 10 MG PO TABS: 10.0000 mg | ORAL_TABLET | Freq: Once | ORAL | Status: DC

## 2019-07-28 NOTE — Patient Instructions (Signed)
Bortezomib injection What is this medicine? BORTEZOMIB (bor TEZ oh mib) is a medicine that targets proteins in cancer cells and stops the cancer cells from growing. It is used to treat multiple myeloma and mantle-cell lymphoma. This medicine may be used for other purposes; ask your health care provider or pharmacist if you have questions. COMMON BRAND NAME(S): Velcade What should I tell my health care provider before I take this medicine? They need to know if you have any of these conditions:  diabetes  heart disease  irregular heartbeat  liver disease  on hemodialysis  low blood counts, like low white blood cells, platelets, or hemoglobin  peripheral neuropathy  taking medicine for blood pressure  an unusual or allergic reaction to bortezomib, mannitol, boron, other medicines, foods, dyes, or preservatives  pregnant or trying to get pregnant  breast-feeding How should I use this medicine? This medicine is for injection into a vein or for injection under the skin. It is given by a health care professional in a hospital or clinic setting. Talk to your pediatrician regarding the use of this medicine in children. Special care may be needed. Overdosage: If you think you have taken too much of this medicine contact a poison control center or emergency room at once. NOTE: This medicine is only for you. Do not share this medicine with others. What if I miss a dose? It is important not to miss your dose. Call your doctor or health care professional if you are unable to keep an appointment. What may interact with this medicine? This medicine may interact with the following medications:  ketoconazole  rifampin  ritonavir  St. John's Wort This list may not describe all possible interactions. Give your health care provider a list of all the medicines, herbs, non-prescription drugs, or dietary supplements you use. Also tell them if you smoke, drink alcohol, or use illegal drugs. Some  items may interact with your medicine. What should I watch for while using this medicine? You may get drowsy or dizzy. Do not drive, use machinery, or do anything that needs mental alertness until you know how this medicine affects you. Do not stand or sit up quickly, especially if you are an older patient. This reduces the risk of dizzy or fainting spells. In some cases, you may be given additional medicines to help with side effects. Follow all directions for their use. Call your doctor or health care professional for advice if you get a fever, chills or sore throat, or other symptoms of a cold or flu. Do not treat yourself. This drug decreases your body's ability to fight infections. Try to avoid being around people who are sick. This medicine may increase your risk to bruise or bleed. Call your doctor or health care professional if you notice any unusual bleeding. You may need blood work done while you are taking this medicine. In some patients, this medicine may cause a serious brain infection that may cause death. If you have any problems seeing, thinking, speaking, walking, or standing, tell your doctor right away. If you cannot reach your doctor, urgently seek other source of medical care. Check with your doctor or health care professional if you get an attack of severe diarrhea, nausea and vomiting, or if you sweat a lot. The loss of too much body fluid can make it dangerous for you to take this medicine. Do not become pregnant while taking this medicine or for at least 7 months after stopping it. Women should inform their doctor   if they wish to become pregnant or think they might be pregnant. Men should not father a child while taking this medicine and for at least 4 months after stopping it. There is a potential for serious side effects to an unborn child. Talk to your health care professional or pharmacist for more information. Do not breast-feed an infant while taking this medicine or for 2  months after stopping it. This medicine may interfere with the ability to have a child. You should talk with your doctor or health care professional if you are concerned about your fertility. What side effects may I notice from receiving this medicine? Side effects that you should report to your doctor or health care professional as soon as possible:  allergic reactions like skin rash, itching or hives, swelling of the face, lips, or tongue  breathing problems  changes in hearing  changes in vision  fast, irregular heartbeat  feeling faint or lightheaded, falls  pain, tingling, numbness in the hands or feet  right upper belly pain  seizures  swelling of the ankles, feet, hands  unusual bleeding or bruising  unusually weak or tired  vomiting  yellowing of the eyes or skin Side effects that usually do not require medical attention (report to your doctor or health care professional if they continue or are bothersome):  changes in emotions or moods  constipation  diarrhea  loss of appetite  headache  irritation at site where injected  nausea This list may not describe all possible side effects. Call your doctor for medical advice about side effects. You may report side effects to FDA at 1-800-FDA-1088. Where should I keep my medicine? This drug is given in a hospital or clinic and will not be stored at home. NOTE: This sheet is a summary. It may not cover all possible information. If you have questions about this medicine, talk to your doctor, pharmacist, or health care provider.  2020 [employer] (2018-03-07 16:29:31)  

## 2019-07-31 DIAGNOSIS — I447 Left bundle-branch block, unspecified: Secondary | ICD-10-CM | POA: Diagnosis not present

## 2019-07-31 DIAGNOSIS — Z95810 Presence of automatic (implantable) cardiac defibrillator: Secondary | ICD-10-CM | POA: Diagnosis not present

## 2019-07-31 DIAGNOSIS — Z4502 Encounter for adjustment and management of automatic implantable cardiac defibrillator: Secondary | ICD-10-CM | POA: Diagnosis not present

## 2019-07-31 DIAGNOSIS — I420 Dilated cardiomyopathy: Secondary | ICD-10-CM | POA: Diagnosis not present

## 2019-08-03 ENCOUNTER — Other Ambulatory Visit: Payer: Self-pay | Admitting: *Deleted

## 2019-08-03 DIAGNOSIS — C9000 Multiple myeloma not having achieved remission: Secondary | ICD-10-CM

## 2019-08-04 ENCOUNTER — Inpatient Hospital Stay: Payer: Medicare Other

## 2019-08-04 ENCOUNTER — Other Ambulatory Visit: Payer: Self-pay

## 2019-08-04 VITALS — BP 128/77 | HR 88 | Temp 97.7°F | Resp 18

## 2019-08-04 DIAGNOSIS — C9000 Multiple myeloma not having achieved remission: Secondary | ICD-10-CM

## 2019-08-04 DIAGNOSIS — Z5112 Encounter for antineoplastic immunotherapy: Secondary | ICD-10-CM | POA: Diagnosis not present

## 2019-08-04 LAB — COMPREHENSIVE METABOLIC PANEL
ALT: 13 U/L (ref 0–44)
AST: 18 U/L (ref 15–41)
Albumin: 4 g/dL (ref 3.5–5.0)
Alkaline Phosphatase: 48 U/L (ref 38–126)
Anion gap: 8 (ref 5–15)
BUN: 26 mg/dL — ABNORMAL HIGH (ref 8–23)
CO2: 26 mmol/L (ref 22–32)
Calcium: 9.6 mg/dL (ref 8.9–10.3)
Chloride: 108 mmol/L (ref 98–111)
Creatinine, Ser: 1.01 mg/dL — ABNORMAL HIGH (ref 0.44–1.00)
GFR calc Af Amer: >60 mL/min (ref >60)
GFR calc non Af Amer: 58 mL/min — ABNORMAL LOW (ref >60)
Glucose, Bld: 83 mg/dL (ref 70–99)
Potassium: 3.9 mmol/L (ref 3.5–5.1)
Sodium: 142 mmol/L (ref 135–145)
Total Bilirubin: 0.4 mg/dL (ref 0.3–1.2)
Total Protein: 8.2 g/dL — ABNORMAL HIGH (ref 6.5–8.1)

## 2019-08-04 LAB — CBC WITH DIFFERENTIAL (CANCER CENTER ONLY)
Abs Immature Granulocytes: 0.01 10*3/uL (ref 0.00–0.07)
Basophils Absolute: 0 10*3/uL (ref 0.0–0.1)
Basophils Relative: 1 %
Eosinophils Absolute: 0.1 10*3/uL (ref 0.0–0.5)
Eosinophils Relative: 2 %
HCT: 36.5 % (ref 36.0–46.0)
Hemoglobin: 11.7 g/dL — ABNORMAL LOW (ref 12.0–15.0)
Immature Granulocytes: 0 %
Lymphocytes Relative: 34 %
Lymphs Abs: 1.4 10*3/uL (ref 0.7–4.0)
MCH: 29.5 pg (ref 26.0–34.0)
MCHC: 32.1 g/dL (ref 30.0–36.0)
MCV: 91.9 fL (ref 80.0–100.0)
Monocytes Absolute: 0.5 10*3/uL (ref 0.1–1.0)
Monocytes Relative: 13 %
Neutro Abs: 2 10*3/uL (ref 1.7–7.7)
Neutrophils Relative %: 50 %
Platelet Count: 126 10*3/uL — ABNORMAL LOW (ref 150–400)
RBC: 3.97 MIL/uL (ref 3.87–5.11)
RDW: 13.8 % (ref 11.5–15.5)
WBC Count: 4 10*3/uL (ref 4.0–10.5)
nRBC: 0 % (ref 0.0–0.2)

## 2019-08-04 MED ORDER — PROCHLORPERAZINE MALEATE 10 MG PO TABS
ORAL_TABLET | ORAL | Status: AC
  Filled 2019-08-04: qty 1

## 2019-08-04 MED ORDER — BORTEZOMIB CHEMO SQ INJECTION 3.5 MG (2.5MG/ML)
1.3000 mg/m2 | Freq: Once | INTRAMUSCULAR | Status: AC
  Administered 2019-08-04: 15:00:00 2.5 mg via SUBCUTANEOUS
  Filled 2019-08-04: qty 1

## 2019-08-09 DIAGNOSIS — G4733 Obstructive sleep apnea (adult) (pediatric): Secondary | ICD-10-CM | POA: Diagnosis not present

## 2019-08-15 ENCOUNTER — Other Ambulatory Visit: Payer: Self-pay

## 2019-08-16 ENCOUNTER — Encounter: Payer: Self-pay | Admitting: Family Medicine

## 2019-08-16 ENCOUNTER — Ambulatory Visit (INDEPENDENT_AMBULATORY_CARE_PROVIDER_SITE_OTHER): Payer: Medicare Other | Admitting: Family Medicine

## 2019-08-16 VITALS — Temp 97.2°F

## 2019-08-16 DIAGNOSIS — M545 Low back pain, unspecified: Secondary | ICD-10-CM

## 2019-08-16 NOTE — Progress Notes (Signed)
Musculoskeletal Exam  Patient: Tami [name] DOB: Jul 28, 1956  DOS: 08/16/2019  SUBJECTIVE:  Chief Complaint:   Chief Complaint  Patient presents with  . Back Pain    Tami [name] is a 63 y.o.  female for evaluation and treatment of her back pain. Due to COVID-19 pandemic, we are interacting via telephone. I verified patient's ID using 2 identifiers. Patient agreed to proceed with visit via this method. Patient is at home, I am at office. Patient and I are present for visit.    Onset:  1 week ago. No inj or change in acitivity.  Location: lower middle Character:  sharp  Progression of issue:  has slightly improved Associated symptoms: difficulty walking,  Denies bowel/bladder incontinence or weakness Treatment: to date has been acetaminophen, ice, heating pad. Neurovascular symptoms: no  ROS: Musculoskeletal/Extremities: +back pain Neurologic: no numbness, tingling no weakness   Past Medical History:  Diagnosis Date  . Abnormal SPEP 07/26/2016  . Arthritis    knees, hands  . Back pain 07/14/2016  . Bowel obstruction ([place]) 11/2014  . C. difficile diarrhea   . CHF (congestive heart failure) ([place])   . Colitis   . Congestive heart failure ([place]) 02/24/2015   S/p pacemaker  . Depression   . Elevated sed rate 08/15/2017  . Epigastric pain 03/22/2017  . GERD (gastroesophageal reflux disease)   . Hyperlipidemia   . Hypertension   . 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 Low back pain 07/14/2016  . 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 Multiple myeloma ([employer]) 07/20/2018  . Multiple myeloma not having achieved remission ([place]) 07/20/2018  . Myalgia 12/31/2016  . Obstructive sleep apnea 03/31/2015  . SOB (shortness of breath) 04/09/2016  . Stroke ([place])    TIAs  . UTI (urinary tract infection)     Objective:  VITAL SIGNS: Temp (!) 97.2 F (36.2 C) (Oral)  No conversational dyspnea Age appropriate judgment and insight Nml affect and  mood  Assessment:  Acute midline low back pain, unspecified whether sciatica present  Plan: Orders as above. Doing better overall, will hold off on more intervention. Stretches/exercises, heat, ice, Tylenol. Total time spent: 6:40 F/u prn. The patient voiced understanding and agreement to the plan.   [place], DO 08/16/19  1:37 PM

## 2019-08-16 NOTE — Patient Instructions (Addendum)
Heat (pad or rice pillow in microwave) over affected area, 10-15 minutes twice daily.   Ice/cold pack over area for 10-15 min twice daily.  OK to take Tylenol 1000 mg (2 extra strength tabs) or 975 mg (3 regular strength tabs) every 6 hours as needed.  EXERCISES  RANGE OF MOTION (ROM) AND STRETCHING EXERCISES - Low Back Pain Most people with lower back pain will find that their symptoms get worse with excessive bending forward (flexion) or arching at the lower back (extension). The exercises that will help resolve your symptoms will focus on the opposite motion.  If you have pain, numbness or tingling which travels down into your buttocks, leg or foot, the goal of the therapy is for these symptoms to move closer to your back and eventually resolve. Sometimes, these leg symptoms will get better, but your lower back pain may worsen. This is often an indication of progress in your rehabilitation. Be very alert to any changes in your symptoms and the activities in which you participated in the 24 hours prior to the change. Sharing this information with your caregiver will allow him or her to most efficiently treat your condition. These exercises may help you when beginning to rehabilitate your injury. Your symptoms may resolve with or without further involvement from your physician, physical therapist or athletic trainer. While completing these exercises, remember:   Restoring tissue flexibility helps normal motion to return to the joints. This allows healthier, less painful movement and activity.  An effective stretch should be held for at least 30 seconds.  A stretch should never be painful. You should only feel a gentle lengthening or release in the stretched tissue. FLEXION RANGE OF MOTION AND STRETCHING EXERCISES:  STRETCH - Flexion, Single Knee to Chest   Lie on a firm bed or floor with both legs extended in front of you.  Keeping one leg in contact with the floor, bring your opposite knee  to your chest. Hold your leg in place by either grabbing behind your thigh or at your knee.  Pull until you feel a gentle stretch in your low back. Hold 30 seconds.  Slowly release your grasp and repeat the exercise with the opposite side. Repeat 2 times. Complete this exercise 3 times per week.   STRETCH - Flexion, Double Knee to Chest  Lie on a firm bed or floor with both legs extended in front of you.  Keeping one leg in contact with the floor, bring your opposite knee to your chest.  Tense your stomach muscles to support your back and then lift your other knee to your chest. Hold your legs in place by either grabbing behind your thighs or at your knees.  Pull both knees toward your chest until you feel a gentle stretch in your low back. Hold 30 seconds.  Tense your stomach muscles and slowly return one leg at a time to the floor. Repeat 2 times. Complete this exercise 3 times per week.   STRETCH - Low Trunk Rotation  Lie on a firm bed or floor. Keeping your legs in front of you, bend your knees so they are both pointed toward the ceiling and your feet are flat on the floor.  Extend your arms out to the side. This will stabilize your upper body by keeping your shoulders in contact with the floor.  Gently and slowly drop both knees together to one side until you feel a gentle stretch in your low back. Hold for 30 seconds.  Tense  your stomach muscles to support your lower back as you bring your knees back to the starting position. Repeat the exercise to the other side. Repeat 2 times. Complete this exercise at least 3 times per week.   EXTENSION RANGE OF MOTION AND FLEXIBILITY EXERCISES:  STRETCH - Extension, Prone on Elbows   Lie on your stomach on the floor, a bed will be too soft. Place your palms about shoulder width apart and at the height of your head.  Place your elbows under your shoulders. If this is too painful, stack pillows under your chest.  Allow your body to  relax so that your hips drop lower and make contact more completely with the floor.  Hold this position for 30 seconds.  Slowly return to lying flat on the floor. Repeat 2 times. Complete this exercise 3 times per week.   RANGE OF MOTION - Extension, Prone Press Ups  Lie on your stomach on the floor, a bed will be too soft. Place your palms about shoulder width apart and at the height of your head.  Keeping your back as relaxed as possible, slowly straighten your elbows while keeping your hips on the floor. You may adjust the placement of your hands to maximize your comfort. As you gain motion, your hands will come more underneath your shoulders.  Hold this position 30 seconds.  Slowly return to lying flat on the floor. Repeat 2 times. Complete this exercise 3 times per week.   RANGE OF MOTION- Quadruped, Neutral Spine   Assume a hands and knees position on a firm surface. Keep your hands under your shoulders and your knees under your hips. You may place padding under your knees for comfort.  Drop your head and point your tailbone toward the ground below you. This will round out your lower back like an angry cat. Hold this position for 30 seconds.  Slowly lift your head and release your tail bone so that your back sags into a large arch, like an old horse.  Hold this position for 30 seconds.  Repeat this until you feel limber in your low back.  Now, find your "sweet spot." This will be the most comfortable position somewhere between the two previous positions. This is your neutral spine. Once you have found this position, tense your stomach muscles to support your low back.  Hold this position for 30 seconds. Repeat 2 times. Complete this exercise 3 times per week.   STRENGTHENING EXERCISES - Low Back Sprain These exercises may help you when beginning to rehabilitate your injury. These exercises should be done near your "sweet spot." This is the neutral, low-back arch, somewhere  between fully rounded and fully arched, that is your least painful position. When performed in this safe range of motion, these exercises can be used for people who have either a flexion or extension based injury. These exercises may resolve your symptoms with or without further involvement from your physician, physical therapist or athletic trainer. While completing these exercises, remember:   Muscles can gain both the endurance and the strength needed for everyday activities through controlled exercises.  Complete these exercises as instructed by your physician, physical therapist or athletic trainer. Increase the resistance and repetitions only as guided.  You may experience muscle soreness or fatigue, but the pain or discomfort you are trying to eliminate should never worsen during these exercises. If this pain does worsen, stop and make certain you are following the directions exactly. If the pain is still  present after adjustments, discontinue the exercise until you can discuss the trouble with your caregiver.  STRENGTHENING - Deep Abdominals, Pelvic Tilt   Lie on a firm bed or floor. Keeping your legs in front of you, bend your knees so they are both pointed toward the ceiling and your feet are flat on the floor.  Tense your lower abdominal muscles to press your low back into the floor. This motion will rotate your pelvis so that your tail bone is scooping upwards rather than pointing at your feet or into the floor. With a gentle tension and even breathing, hold this position for 3 seconds. Repeat 2 times. Complete this exercise 3 times per week.   STRENGTHENING - Abdominals, Crunches   Lie on a firm bed or floor. Keeping your legs in front of you, bend your knees so they are both pointed toward the ceiling and your feet are flat on the floor. Cross your arms over your chest.  Slightly tip your chin down without bending your neck.  Tense your abdominals and slowly lift your trunk high  enough to just clear your shoulder blades. Lifting higher can put excessive stress on the lower back and does not further strengthen your abdominal muscles.  Control your return to the starting position. Repeat 2 times. Complete this exercise 3 times per week.   STRENGTHENING - Quadruped, Opposite UE/LE Lift   Assume a hands and knees position on a firm surface. Keep your hands under your shoulders and your knees under your hips. You may place padding under your knees for comfort.  Find your neutral spine and gently tense your abdominal muscles so that you can maintain this position. Your shoulders and hips should form a rectangle that is parallel with the floor and is not twisted.  Keeping your trunk steady, lift your right hand no higher than your shoulder and then your left leg no higher than your hip. Make sure you are not holding your breath. Hold this position for 30 seconds.  Continuing to keep your abdominal muscles tense and your back steady, slowly return to your starting position. Repeat with the opposite arm and leg. Repeat 2 times. Complete this exercise 3 times per week.   STRENGTHENING - Abdominals and Quadriceps, Straight Leg Raise   Lie on a firm bed or floor with both legs extended in front of you.  Keeping one leg in contact with the floor, bend the other knee so that your foot can rest flat on the floor.  Find your neutral spine, and tense your abdominal muscles to maintain your spinal position throughout the exercise.  Slowly lift your straight leg off the floor about 6 inches for a count of 3, making sure to not hold your breath.  Still keeping your neutral spine, slowly lower your leg all the way to the floor. Repeat this exercise with each leg 2 times. Complete this exercise 3 times per week.  POSTURE AND BODY MECHANICS CONSIDERATIONS - Low Back Sprain Keeping correct posture when sitting, standing or completing your activities will reduce the stress put on  different body tissues, allowing injured tissues a chance to heal and limiting painful experiences. The following are general guidelines for improved posture.  While reading these guidelines, remember:  The exercises prescribed by your provider will help you have the flexibility and strength to maintain correct postures.  The correct posture provides the best environment for your joints to work. All of your joints have less wear and tear when properly  supported by a spine with good posture. This means you will experience a healthier, less painful body.  Correct posture must be practiced with all of your activities, especially prolonged sitting and standing. Correct posture is as important when doing repetitive low-stress activities (typing) as it is when doing a single heavy-load activity (lifting).  RESTING POSITIONS Consider which positions are most painful for you when choosing a resting position. If you have pain with flexion-based activities (sitting, bending, stooping, squatting), choose a position that allows you to rest in a less flexed posture. You would want to avoid curling into a fetal position on your side. If your pain worsens with extension-based activities (prolonged standing, working overhead), avoid resting in an extended position such as sleeping on your stomach. Most people will find more comfort when they rest with their spine in a more neutral position, neither too rounded nor too arched. Lying on a non-sagging bed on your side with a pillow between your knees, or on your back with a pillow under your knees will often provide some relief. Keep in mind, being in any one position for a prolonged period of time, no matter how correct your posture, can still lead to stiffness.  PROPER SITTING POSTURE In order to minimize stress and discomfort on your spine, you must sit with correct posture. Sitting with good posture should be effortless for a healthy body. Returning to good posture is  a gradual process. Many people can work toward this most comfortably by using various supports until they have the flexibility and strength to maintain this posture on their own. When sitting with proper posture, your ears will fall over your shoulders and your shoulders will fall over your hips. You should use the back of the chair to support your upper back. Your lower back will be in a neutral position, just slightly arched. You may place a small pillow or folded towel at the base of your lower back for  support.  When working at a desk, create an environment that supports good, upright posture. Without extra support, muscles tire, which leads to excessive strain on joints and other tissues. Keep these recommendations in mind:  CHAIR:  A chair should be able to slide under your desk when your back makes contact with the back of the chair. This allows you to work closely.  The chair's height should allow your eyes to be level with the upper part of your monitor and your hands to be slightly lower than your elbows.  BODY POSITION  Your feet should make contact with the floor. If this is not possible, use a foot rest.  Keep your ears over your shoulders. This will reduce stress on your neck and low back.  INCORRECT SITTING POSTURES  If you are feeling tired and unable to assume a healthy sitting posture, do not slouch or slump. This puts excessive strain on your back tissues, causing more damage and pain. Healthier options include:  Using more support, like a lumbar pillow.  Switching tasks to something that requires you to be upright or walking.  Talking a brief walk.  Lying down to rest in a neutral-spine position.  PROLONGED STANDING WHILE SLIGHTLY LEANING FORWARD  When completing a task that requires you to lean forward while standing in one place for a long time, place either foot up on a stationary 2-4 inch high object to help maintain the best posture. When both feet are on the  ground, the lower back tends to lose its  slight inward curve. If this curve flattens (or becomes too large), then the back and your other joints will experience too much stress, tire more quickly, and can cause pain.  CORRECT STANDING POSTURES Proper standing posture should be assumed with all daily activities, even if they only take a few moments, like when brushing your teeth. As in sitting, your ears should fall over your shoulders and your shoulders should fall over your hips. You should keep a slight tension in your abdominal muscles to brace your spine. Your tailbone should point down to the ground, not behind your body, resulting in an over-extended swayback posture.   INCORRECT STANDING POSTURES  Common incorrect standing postures include a forward head, locked knees and/or an excessive swayback. WALKING Walk with an upright posture. Your ears, shoulders and hips should all line-up.  PROLONGED ACTIVITY IN A FLEXED POSITION When completing a task that requires you to bend forward at your waist or lean over a low surface, try to find a way to stabilize 3 out of 4 of your limbs. You can place a hand or elbow on your thigh or rest a knee on the surface you are reaching across. This will provide you more stability, so that your muscles do not tire as quickly. By keeping your knees relaxed, or slightly bent, you will also reduce stress across your lower back. CORRECT LIFTING TECHNIQUES  DO :  Assume a wide stance. This will provide you more stability and the opportunity to get as close as possible to the object which you are lifting.  Tense your abdominals to brace your spine. Bend at the knees and hips. Keeping your back locked in a neutral-spine position, lift using your leg muscles. Lift with your legs, keeping your back straight.  Test the weight of unknown objects before attempting to lift them.  Try to keep your elbows locked down at your sides in order get the best strength from your  shoulders when carrying an object.     Always ask for help when lifting heavy or awkward objects. INCORRECT LIFTING TECHNIQUES DO NOT:   Lock your knees when lifting, even if it is a small object.  Bend and twist. Pivot at your feet or move your feet when needing to change directions.  Assume that you can safely pick up even a paperclip without proper posture.   for

## 2019-08-22 ENCOUNTER — Other Ambulatory Visit: Payer: Self-pay | Admitting: *Deleted

## 2019-08-22 DIAGNOSIS — C9000 Multiple myeloma not having achieved remission: Secondary | ICD-10-CM

## 2019-08-22 MED ORDER — POMALIDOMIDE 2 MG PO CAPS: ORAL_CAPSULE | ORAL | 0 refills | Status: DC

## 2019-08-23 DIAGNOSIS — J40 Bronchitis, not specified as acute or chronic: Secondary | ICD-10-CM | POA: Diagnosis not present

## 2019-08-23 DIAGNOSIS — I081 Rheumatic disorders of both mitral and tricuspid valves: Secondary | ICD-10-CM | POA: Diagnosis not present

## 2019-08-25 ENCOUNTER — Inpatient Hospital Stay: Payer: Medicare Other

## 2019-08-25 ENCOUNTER — Other Ambulatory Visit: Payer: Self-pay

## 2019-08-25 ENCOUNTER — Encounter: Payer: Self-pay | Admitting: Hematology & Oncology

## 2019-08-25 ENCOUNTER — Inpatient Hospital Stay: Payer: Medicare Other | Attending: Hematology & Oncology | Admitting: Hematology & Oncology

## 2019-08-25 VITALS — BP 140/87 | HR 96 | Temp 97.3°F | Resp 16 | Wt 188.0 lb

## 2019-08-25 DIAGNOSIS — C9000 Multiple myeloma not having achieved remission: Secondary | ICD-10-CM | POA: Diagnosis not present

## 2019-08-25 DIAGNOSIS — Z5112 Encounter for antineoplastic immunotherapy: Secondary | ICD-10-CM | POA: Diagnosis not present

## 2019-08-25 LAB — CBC WITH DIFFERENTIAL (CANCER CENTER ONLY)
Abs Immature Granulocytes: 0 10*3/uL (ref 0.00–0.07)
Basophils Absolute: 0 10*3/uL (ref 0.0–0.1)
Basophils Relative: 1 %
Eosinophils Absolute: 0 10*3/uL (ref 0.0–0.5)
Eosinophils Relative: 1 %
HCT: 37.9 % (ref 36.0–46.0)
Hemoglobin: 12 g/dL (ref 12.0–15.0)
Immature Granulocytes: 0 %
Lymphocytes Relative: 34 %
Lymphs Abs: 1.1 10*3/uL (ref 0.7–4.0)
MCH: 29.1 pg (ref 26.0–34.0)
MCHC: 31.7 g/dL (ref 30.0–36.0)
MCV: 92 fL (ref 80.0–100.0)
Monocytes Absolute: 0.3 10*3/uL (ref 0.1–1.0)
Monocytes Relative: 8 %
Neutro Abs: 1.8 10*3/uL (ref 1.7–7.7)
Neutrophils Relative %: 56 %
Platelet Count: 142 10*3/uL — ABNORMAL LOW (ref 150–400)
RBC: 4.12 MIL/uL (ref 3.87–5.11)
RDW: 13.6 % (ref 11.5–15.5)
WBC Count: 3.2 10*3/uL — ABNORMAL LOW (ref 4.0–10.5)
nRBC: 0 % (ref 0.0–0.2)

## 2019-08-25 LAB — CMP (CANCER CENTER ONLY)
ALT: 14 U/L (ref 0–44)
AST: 20 U/L (ref 15–41)
Albumin: 4.1 g/dL (ref 3.5–5.0)
Alkaline Phosphatase: 45 U/L (ref 38–126)
Anion gap: 7 (ref 5–15)
BUN: 25 mg/dL — ABNORMAL HIGH (ref 8–23)
CO2: 27 mmol/L (ref 22–32)
Calcium: 9.7 mg/dL (ref 8.9–10.3)
Chloride: 105 mmol/L (ref 98–111)
Creatinine: 0.9 mg/dL (ref 0.44–1.00)
GFR, Est AFR Am: >60 mL/min (ref >60)
GFR, Estimated: >60 mL/min (ref >60)
Glucose, Bld: 100 mg/dL — ABNORMAL HIGH (ref 70–99)
Potassium: 4 mmol/L (ref 3.5–5.1)
Sodium: 139 mmol/L (ref 135–145)
Total Bilirubin: 0.4 mg/dL (ref 0.3–1.2)
Total Protein: 8.2 g/dL — ABNORMAL HIGH (ref 6.5–8.1)

## 2019-08-25 MED ORDER — PROCHLORPERAZINE MALEATE 10 MG PO TABS: 10.0000 mg | ORAL_TABLET | Freq: Once | ORAL | Status: DC

## 2019-08-25 MED ORDER — BORTEZOMIB CHEMO SQ INJECTION 3.5 MG (2.5MG/ML)
1.3000 mg/m2 | Freq: Once | INTRAMUSCULAR | Status: AC
  Administered 2019-08-25: 2.5 mg via SUBCUTANEOUS
  Filled 2019-08-25: qty 1

## 2019-08-25 NOTE — Patient Instructions (Signed)
[facility] Discharge Instructions for Patients Receiving Chemotherapy  Today you received the following chemotherapy agents Velcade To help prevent nausea and vomiting after your treatment, we encourage you to take your nausea medication as prescribed.   If you develop nausea and vomiting that is not controlled by your nausea medication, call the clinic.   BELOW ARE SYMPTOMS THAT SHOULD BE REPORTED IMMEDIATELY:  *FEVER GREATER THAN 100.5 F  *CHILLS WITH OR WITHOUT FEVER  NAUSEA AND VOMITING THAT IS NOT CONTROLLED WITH YOUR NAUSEA MEDICATION  *UNUSUAL SHORTNESS OF BREATH  *UNUSUAL BRUISING OR BLEEDING  TENDERNESS IN MOUTH AND THROAT WITH OR WITHOUT PRESENCE OF ULCERS  *URINARY PROBLEMS  *BOWEL PROBLEMS  UNUSUAL RASH Items with * indicate a potential emergency and should be followed up as soon as possible.  Feel free to call the clinic should you have any questions or concerns. The clinic phone number is [phone].  Please show the CHEMO ALERT CARD at check-in to the Emergency Department and triage nurse.   

## 2019-08-25 NOTE — Progress Notes (Signed)
Hematology and Oncology Follow Up Visit  Tami [name]:[id number] 1956/11/01 63 y.o. 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  So far, things are going pretty well for her.  She seems to be doing okay with the Velcade/pomalidomide combination.  Her only complaint is that at nighttime, she said her feet get numb.,  Sure exactly what this means.  This does not happen during the daytime.  There is no weakness.  I checked her pulses in the distal legs and everything felt good.  I do not know if this is some kind of neuropathy that she might be developing.  Again I will think would be unusual given that this is only at nighttime.  I  told her to take a teaspoon of mustard before bedtime and will see if this might not help.  Her last myeloma studies that we did about a month ago showed an M spike that seem to be improving.   The M spike was down to 0.5 g/dL. Her IgG level was a little bit better at 1730 mg/dL.  Her kappa light chain was stable at 4.2 mg/dL.  For right now, I probably would not make any changes with her protocol as she just has a low level of myeloma and is asymptomatic.Marland Kitchen  She is tolerating her treatments well.  There is no abdominal issues.  She is having no diarrhea.  She is planning on going to yard sales this weekend.  She loves to go on the yard sales.    Overall, her performance status is ECOG 1.  Medications:  Allergies as of 08/25/2019      Reactions   Benazepril Anaphylaxis, Swelling   angioedema Throat and lip swelling   Ondansetron Hcl Hives   Redness and hives post IV admin on 07/05/17   Codeine Nausea And Vomiting   Morphine Hives   Redness and hives noted post IV admin on 07/05/17      Medication List       Accurate as of August 25, 2019  1:56 PM. If you have any questions, ask your nurse or doctor.        acetaminophen 325 MG tablet Commonly known as: TYLENOL Take 650 mg by mouth.   ACETAMINOPHEN-BUTALBITAL 50-325 MG Tabs Take 1 tablet by mouth 2 (two) times daily as needed.   albuterol 108 (90 Base) MCG/ACT inhaler Commonly known as: VENTOLIN HFA Inhale 2 puffs into the lungs every 6 (six) hours as needed for wheezing or shortness of breath.   amLODipine 10 MG tablet Commonly known as: NORVASC Take 10 mg by mouth daily.   ARTIFICIAL TEARS OP Apply to eye.   aspirin EC 81 MG tablet Take 1 tablet (81 mg total) by mouth daily.   carvedilol 25 MG tablet Commonly known as: COREG Take 1 tablet (25 mg total) by mouth 2 (two) times daily with a meal.   cetirizine 10 MG tablet Commonly known as: ZYRTEC Take 1 tablet (10 mg total) by mouth every morning.   cyclobenzaprine 10 MG tablet Commonly known as: FLEXERIL Take 10 mg by mouth as needed.   dexamethasone 4 MG tablet Commonly known as: DECADRON Take 5 pills at one time with food ONCE a WEEK!!   famciclovir 250  MG tablet Commonly known as: FAMVIR Take 1 tablet (250 mg total) by mouth daily.   famotidine 40 MG/5ML suspension Commonly known as: PEPCID Take 5 mLs (40 mg total) by mouth at bedtime as needed for heartburn or indigestion.   fluticasone 50 MCG/ACT nasal spray Commonly known as: FLONASE Place 2 sprays into both nostrils daily.   furosemide 20 MG tablet Commonly known as: LASIX Take 1 tablet (20 mg total) by mouth daily as needed for fluid or edema.   HYDROcodone-acetaminophen 5-325 MG tablet Commonly known as: NORCO/VICODIN Take 1 tablet by mouth every 4 (four) hours as needed for moderate pain or severe pain.   hyoscyamine 0.125 MG SL tablet Commonly known as: LEVSIN SL Place 1 tablet (0.125 mg total) under the tongue every 4 (four) hours as needed.   lidocaine 5 % Commonly known as: Lidoderm  Place 1 patch onto the skin daily. Remove & Discard patch within 12 hours or as directed by MD   MULTIVITAMIN ADULT PO Take by mouth.   ondansetron 4 MG tablet Commonly known as: Zofran Take 1 tablet (4 mg total) by mouth every 8 (eight) hours as needed for nausea or vomiting.   pantoprazole 40 MG tablet Commonly known as: PROTONIX Take 1 tablet (40 mg total) by mouth 2 (two) times daily.   pomalidomide 2 MG capsule Commonly known as: POMALYST Take one capsule daily-Days #1-21. Repeat every 28 days. [id number]   potassium chloride SA 20 MEQ tablet Commonly known as: KLOR-CON Take 2 tablets (40 mEq total) by mouth once for 1 dose.   Premarin vaginal cream Generic drug: conjugated estrogens Place vaginally.   prochlorperazine 10 MG tablet Commonly known as: COMPAZINE Take 1 tablet (10 mg total) by mouth every 6 (six) hours as needed (Nausea or vomiting).   sucralfate 1 g tablet Commonly known as: Carafate Take 1 tablet (1 g total) by mouth 4 (four) times daily -  with meals and at bedtime.   topiramate 50 MG tablet Commonly known as: Topamax Take 1 tablet (50 mg total) by mouth 2 (two) times daily.   triamterene-hydrochlorothiazide 37.5-25 MG tablet Commonly known as: Maxzide-25 Take 1 tablet by mouth daily.       Allergies:  Allergies  Allergen Reactions  . Benazepril Anaphylaxis and Swelling    angioedema Throat and lip swelling  . Ondansetron Hcl Hives    Redness and hives post IV admin on 07/05/17  . Codeine Nausea And Vomiting  . Morphine Hives    Redness and hives noted post IV admin on 07/05/17    Past Medical History, Surgical history, Social history, and Family History were reviewed and updated.  Review of Systems: Review of Systems  Constitutional: Negative.   HENT: Negative.   Eyes: Negative.   Respiratory: Negative.   Cardiovascular: Negative.   Gastrointestinal: Negative.   Genitourinary: Negative.   Musculoskeletal: Negative.   Skin:  Positive for rash.  Neurological: Negative.   Endo/Heme/Allergies: Negative.   Psychiatric/Behavioral: Negative.      Physical Exam:  weight is 188 lb (85.3 kg). Her temporal temperature is 97.3 F (36.3 C) (abnormal). Her blood pressure is 140/87 and her pulse is 96. Her respiration is 16 and oxygen saturation is 100%.   Wt Readings from Last 3 Encounters:  08/25/19 188 lb (85.3 kg)  07/21/19 191 lb (86.6 kg)  07/13/19 189 lb (85.7 kg)    Physical Exam Vitals signs reviewed.  HENT:     Head: Normocephalic and atraumatic.  Eyes:  Pupils: Pupils are equal, round, and reactive to light.  Neck:     Musculoskeletal: Normal range of motion.  Cardiovascular:     Rate and Rhythm: Normal rate and regular rhythm.     Heart sounds: Normal heart sounds.  Pulmonary:     Effort: Pulmonary effort is normal.     Breath sounds: Normal breath sounds.  Abdominal:     General: Bowel sounds are normal.     Palpations: Abdomen is soft.  Musculoskeletal: Normal range of motion.        General: No tenderness or deformity.  Lymphadenopathy:     Cervical: No cervical adenopathy.  Skin:    General: Skin is warm and dry.     Findings: No erythema or rash.  Neurological:     Mental Status: She is alert and oriented to person, place, and time.  Psychiatric:        Behavior: Behavior normal.        Thought Content: Thought content normal.        Judgment: Judgment normal.      Lab Results  Component Value Date   WBC 3.2 (L) 08/25/2019   HGB 12.0 08/25/2019   HCT 37.9 08/25/2019   MCV 92.0 08/25/2019   PLT 142 (L) 08/25/2019   Lab Results  Component Value Date   FERRITIN 169 09/16/2016   IRON 59 09/16/2016   TIBC 281 09/16/2016   UIBC 222 09/16/2016   IRONPCTSAT 21 09/16/2016   Lab Results  Component Value Date   RETICCTPCT 0.8 07/16/2016   RBC 4.12 08/25/2019   RETICCTABS 32,640 07/16/2016   Lab Results  Component Value Date   KPAFRELGTCHN 42.4 (H) 07/21/2019    LAMBDASER 10.5 07/21/2019   KAPLAMBRATIO 4.04 (H) 07/21/2019   Lab Results  Component Value Date   IGGSERUM 1,754 (H) 07/21/2019   IGA 173 07/21/2019   IGMSERUM 78 07/21/2019   Lab Results  Component Value Date   TOTALPROTELP 7.1 07/21/2019   ALBUMINELP 3.4 07/21/2019   A1GS 0.2 07/21/2019   A2GS 1.0 07/21/2019   BETS 1.0 07/21/2019   BETA2SER 0.5 07/16/2016   GAMS 1.6 07/21/2019   MSPIKE 0.5 (H) 07/21/2019   SPEI Comment 07/21/2019     Chemistry      Component Value Date/Time   NA 139 08/25/2019 1254   NA 143 11/08/2017 1127   NA 140 12/31/2016 1000   K 4.0 08/25/2019 1254   K 4.0 11/08/2017 1127   K 3.6 12/31/2016 1000   CL 105 08/25/2019 1254   CL 107 11/08/2017 1127   CO2 27 08/25/2019 1254   CO2 25 11/08/2017 1127   CO2 24 12/31/2016 1000   BUN 25 (H) 08/25/2019 1254   BUN 19 11/08/2017 1127   BUN 22.3 12/31/2016 1000   CREATININE 0.90 08/25/2019 1254   CREATININE 0.9 11/08/2017 1127   CREATININE 0.8 12/31/2016 1000      Component Value Date/Time   CALCIUM 9.7 08/25/2019 1254   CALCIUM 9.9 11/08/2017 1127   CALCIUM 9.5 12/31/2016 1000   ALKPHOS 45 08/25/2019 1254   ALKPHOS 44 11/08/2017 1127   ALKPHOS 51 12/31/2016 1000   AST 20 08/25/2019 1254   AST 28 12/31/2016 1000   ALT 14 08/25/2019 1254   ALT 36 11/08/2017 1127   ALT 16 12/31/2016 1000   BILITOT 0.4 08/25/2019 1254   BILITOT 0.46 12/31/2016 1000      Impression and Plan: Ms. Gelb is a very pleasant 63 yo African American  female with a previous IgG kappa MGUS with progression to myeloma.   We will have to watch her myeloma levels closely.  If we find that things are progressing, we may have to make a change with her protocol.  I will plan to see her back in November  when she starts her next 3-week cycle of treatment.   [name], MD 10/16/20201:56 PM

## 2019-08-26 LAB — IGG, IGA, IGM
IgA: 207 mg/dL (ref 87–352)
IgG (Immunoglobin G), Serum: 2644 mg/dL — ABNORMAL HIGH (ref 586–1602)
IgM (Immunoglobulin M), Srm: 92 mg/dL (ref 26–217)

## 2019-08-28 ENCOUNTER — Ambulatory Visit: Payer: Medicaid Other | Admitting: Neurology

## 2019-08-28 LAB — KAPPA/LAMBDA LIGHT CHAINS
Kappa free light chain: 58.1 mg/L — ABNORMAL HIGH (ref 3.3–19.4)
Kappa, lambda light chain ratio: 5.87 — ABNORMAL HIGH (ref 0.26–1.65)
Lambda free light chains: 9.9 mg/L (ref 5.7–26.3)

## 2019-08-29 LAB — IMMUNOFIXATION REFLEX, SERUM
IgA: 204 mg/dL (ref 87–352)
IgG (Immunoglobin G), Serum: 2512 mg/dL — ABNORMAL HIGH (ref 586–1602)
IgM (Immunoglobulin M), Srm: 97 mg/dL (ref 26–217)

## 2019-08-29 LAB — PROTEIN ELECTROPHORESIS, SERUM, WITH REFLEX
A/G Ratio: 0.9 (ref 0.7–1.7)
Albumin ELP: 3.7 g/dL (ref 2.9–4.4)
Alpha-1-Globulin: 0.2 g/dL (ref 0.0–0.4)
Alpha-2-Globulin: 0.9 g/dL (ref 0.4–1.0)
Beta Globulin: 1 g/dL (ref 0.7–1.3)
Gamma Globulin: 1.9 g/dL — ABNORMAL HIGH (ref 0.4–1.8)
Globulin, Total: 3.9 g/dL (ref 2.2–3.9)
M-Spike, %: 1 g/dL — ABNORMAL HIGH
SPEP Interpretation: 0
Total Protein ELP: 7.6 g/dL (ref 6.0–8.5)

## 2019-08-30 ENCOUNTER — Other Ambulatory Visit: Payer: Self-pay

## 2019-08-30 ENCOUNTER — Ambulatory Visit (INDEPENDENT_AMBULATORY_CARE_PROVIDER_SITE_OTHER): Payer: Medicare Other | Admitting: Neurology

## 2019-08-30 ENCOUNTER — Encounter: Payer: Self-pay | Admitting: Neurology

## 2019-08-30 VITALS — BP 137/90 | HR 78 | Temp 98.2°F | Ht 66.0 in | Wt 190.0 lb

## 2019-08-30 DIAGNOSIS — H539 Unspecified visual disturbance: Secondary | ICD-10-CM | POA: Diagnosis not present

## 2019-08-30 DIAGNOSIS — G441 Vascular headache, not elsewhere classified: Secondary | ICD-10-CM | POA: Diagnosis not present

## 2019-08-30 DIAGNOSIS — Z79899 Other long term (current) drug therapy: Secondary | ICD-10-CM | POA: Diagnosis not present

## 2019-08-30 DIAGNOSIS — R7309 Other abnormal glucose: Secondary | ICD-10-CM | POA: Diagnosis not present

## 2019-08-30 DIAGNOSIS — R519 Headache, unspecified: Secondary | ICD-10-CM | POA: Diagnosis not present

## 2019-08-30 DIAGNOSIS — R200 Anesthesia of skin: Secondary | ICD-10-CM | POA: Diagnosis not present

## 2019-08-30 DIAGNOSIS — I671 Cerebral aneurysm, nonruptured: Secondary | ICD-10-CM

## 2019-08-30 DIAGNOSIS — C9000 Multiple myeloma not having achieved remission: Secondary | ICD-10-CM

## 2019-08-30 MED ORDER — ALPRAZOLAM 0.25 MG PO TABS: ORAL_TABLET | ORAL | 0 refills | Status: DC

## 2019-08-30 MED ORDER — METOCLOPRAMIDE HCL 10 MG PO TABS: 10.0000 mg | ORAL_TABLET | Freq: Four times a day (QID) | ORAL | 6 refills | Status: DC

## 2019-08-30 NOTE — Progress Notes (Addendum)
[id number]    Provider:  Dr Jaynee Eagles Requesting Provider: Mosie Lukes, MD Primary Care Provider:  Mosie Lukes, MD  CC:  headaches  HPI:  Tami [name] is a 63 y.o. female here as requested by Mosie Lukes, MD for headaches. Started a few years ago. Here with her cousin who also provides information. Her son has migraines. She has a hx of head trauma she slipped on some wax and the back of her head was hit, been over 30 years, she has been having headaches since then, slowly progressive over the years really bad over the last 2 years. She is having vision changes, blurry vision, double vision, vertigo, difficulty walking, Starts in the back of the head, pounding, pulsating, she has nausea, sound sensitivity, sleeping helps, tylenol doesn't help, also hydrocodone but not often no medication overuse, sometimes she can see stars but more often not. The migraines are severe. She has OSA but uses the machine. 1-2 migraine days a month. They can last 24 hours or longer. No other focal neurologic deficits, associated symptoms, inciting events or modifiable factors.  Reviewed notes, labs and imaging from outside physicians, which showed:  MRA of the head 2006: reviewed report 1.  Approximately 5 mm outpouching in the left MCA bifurcation region seen on oblique lateral views.  This probably represents a vascular loop with an aneurysm though possible felt to be less likely.   2.  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 Pertinent negatives and positives per HPI. All others negative.   Social History   Socioeconomic History   Marital status: Divorced    Spouse name: [name]   Number of children: 3   Years of education: [name]   Highest education level: High school graduate  Occupational History   Not on  file  Social Needs   Financial resource strain: [name]   Food insecurity    Worry: [name]    Inability: [name]   Transportation needs    Medical: [name]    Non-medical: [name]  Tobacco Use   Smoking status: Never Smoker   Smokeless tobacco: Never Used  Substance and Sexual Activity   Alcohol use: No   Drug use: No   Sexual activity: [name]  Lifestyle   Physical activity    Days per week: [name]    Minutes per session: [name]   Stress: [name]  Relationships   Social connections    Talks on phone: [name]    Gets together: [name]    Attends religious service: [name]    Active member of club or organization: [name]    Attends meetings of clubs or organizations: [name]    Relationship status: [name]   Intimate partner violence    Fear of current or ex partner: [name]    Emotionally abused: [name]    Physically abused: [name]    Forced sexual activity: [name]  Other Topics Concern   [name]  Social History Narrative   Lives at home alone   Retired   Caffeine: coffee    Family History  Problem Relation Age of Onset   Diabetes Mother  Hypertension Mother    Heart disease Mother        s/p 1 stent   Hyperlipidemia Mother    Arthritis Mother    Cancer Father        COLON   Colon cancer Father 42   Irritable bowel syndrome Sister    Hyperlipidemia Daughter    Hypertension Daughter    Hypertension Maternal Grandmother    Arthritis Maternal Grandmother    Heart disease Maternal Grandfather        MI   Hypertension Maternal Grandfather    Arthritis Maternal Grandfather    Hypertension Son    Esophageal cancer Neg Hx     Past Medical History:  Diagnosis Date   Abnormal SPEP 07/26/2016   Arthritis    knees, hands   Back pain 07/14/2016   Bowel obstruction ([place]) 11/2014   C. 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   Low back pain 07/14/2016   Monoclonal gammopathy of unknown significance (MGUS) 09/16/2016   Multiple myeloma ([place]) 07/20/2018   Multiple myeloma not having achieved remission ([place]) 07/20/2018   Myalgia 12/31/2016   Obstructive sleep apnea 03/31/2015   SOB (shortness of breath) 04/09/2016   Stroke ([place])    TIAs   UTI (urinary tract infection)     Patient Active Problem List   Diagnosis Date Noted   ETD (Eustachian tube dysfunction), bilateral 09/12/2019   Urinary frequency 07/17/2019   Headache 07/17/2019   Palpitation 05/15/2019   Urticaria 02/22/2019   Conjunctivitis 10/17/2018   Diarrhea 09/04/2018   Thyroid nodule 08/26/2018   Drug rash 08/26/2018   Multiple myeloma ([place]) 07/20/2018   Multiple myeloma not having achieved remission ([place]) 07/20/2018   Hematuria 07/11/2018   Seasonal allergies 06/23/2018   Right shoulder pain 06/07/2018   Neck pain 06/07/2018   Paresthesias 06/07/2018   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   Epigastric pain 03/22/2017   Myalgia 12/31/2016   SOB (shortness of breath) 07/26/2016   Abnormal SPEP 07/26/2016   Low back pain 07/14/2016   Insomnia 12/29/2015   Tension headache 10/21/2015   Muscle spasms of neck 10/18/2015   Dysuria 10/13/2015   Sinusitis 07/07/2015   AP (abdominal pain) 07/07/2015    Cardiomyopathy ([place]) 04/19/2015   Chest wall pain 04/19/2015   Obstructive sleep apnea 03/31/2015   Congestive heart failure ([place]) 02/24/2015   Arthritis    Hyperlipidemia    Hypertension    Depression    Stroke [facility])    GERD (gastroesophageal reflux disease)    Bowel obstruction ([place]) 11/09/2014    Past Surgical History:  Procedure Laterality Date   ABDOMINAL HYSTERECTOMY     menorraghia, 2006, total   CHOLECYSTECTOMY     COLONOSCOPY  2018   [facility] per patient   ESOPHAGOGASTRODUODENOSCOPY  2018   [facility]    KNEE  SURGERY     right, repair torn torn cartialage   PACEMAKER INSERTION  08/2014   TONSILLECTOMY     TUBAL LIGATION      Current Outpatient Medications  Medication Sig Dispense Refill   acetaminophen (TYLENOL) 325 MG tablet Take 650 mg by mouth.     ACETAMINOPHEN-BUTALBITAL 50-325 MG TABS Take 1 tablet by mouth 2 (two) times daily as needed. 120 tablet 1   albuterol (PROVENTIL HFA;VENTOLIN HFA) 108 (90 Base) MCG/ACT inhaler Inhale 2 puffs into the lungs every 6 (six) hours as needed for wheezing or shortness of breath. 1 Inhaler 2   aspirin EC 81 MG tablet Take 1 tablet (81 mg total) by mouth daily. 90 tablet 1   carvedilol (COREG) 25 MG tablet Take 1 tablet (25 mg total) by mouth 2 (two) times daily with a meal. 180 tablet 1   cyclobenzaprine (FLEXERIL) 10 MG tablet Take 10 mg by mouth as needed.      famciclovir (FAMVIR) 250 MG tablet Take 1 tablet (250 mg total) by mouth daily. 30 tablet 12   famotidine (PEPCID) 40 MG/5ML suspension Take 5 mLs (40 mg total) by mouth at bedtime as needed for heartburn or indigestion. 150 mL 2   fluticasone (FLONASE) 50 MCG/ACT nasal spray Place 2 sprays into both nostrils daily. (Patient taking differently: Place 2 sprays into both nostrils as needed. ) 16 g 1   furosemide (LASIX) 20 MG tablet Take 1 tablet (20 mg total) by mouth daily as needed for fluid or edema. 30 tablet 2     HYDROcodone-acetaminophen (NORCO/VICODIN) 5-325 MG tablet Take 1 tablet by mouth every 4 (four) hours as needed for moderate pain or severe pain. 20 tablet 0   hyoscyamine (LEVSIN SL) 0.125 MG SL tablet Place 1 tablet (0.125 mg total) under the tongue every 4 (four) hours as needed. 30 tablet 2   Multiple Vitamins-Minerals (MULTIVITAMIN ADULT PO) Take by mouth.     ondansetron (ZOFRAN) 4 MG tablet Take 1 tablet (4 mg total) by mouth every 8 (eight) hours as needed for nausea or vomiting. 30 tablet 1   pantoprazole (PROTONIX) 40 MG tablet Take 1 tablet (40 mg total) by mouth 2 (two) times daily. 180 tablet 1   pomalidomide (POMALYST) 2 MG capsule Take 2 mg by mouth daily. Every 21 days.     potassium chloride SA (K-DUR) 20 MEQ tablet Take 2 tablets (40 mEq total) by mouth once for 1 dose. 60 tablet 3   sucralfate (CARAFATE) 1 g tablet Take 1 tablet (1 g total) by mouth 4 (four) times daily -  with meals and at bedtime. 120 tablet 0   topiramate (TOPAMAX) 50 MG tablet Take 1 tablet (50 mg total) by mouth 2 (two) times daily. 60 tablet 3   ALPRAZolam (XANAX) 0.25 MG tablet Take 1-2 tabs (0.'25mg'$ -0.'50mg'$ ) 30-60 minutes before procedure. May repeat if needed.Do not drive. 4 tablet 0   amLODipine (NORVASC) 5 MG tablet 1 tab in am and 2 tabs po pm 90 tablet 1   amoxicillin-clavulanate (AUGMENTIN) 875-125 MG tablet Take 1 tablet by mouth 2 (two) times daily. 20 tablet 0   Carboxymethylcellulose Sodium (ARTIFICIAL TEARS OP) Apply to eye.     conjugated estrogens (PREMARIN) vaginal cream Place vaginally.     lidocaine (LIDODERM) 5 % Place 1 patch onto the skin daily. Remove & Discard patch within 12 hours or as directed by MD 30 patch 2   loratadine (CLARITIN) 10 MG tablet Take 1 tablet (10  mg total) by mouth 2 (two) times daily as needed for allergies. 30 tablet 11   metoCLOPramide (REGLAN) 10 MG tablet Take 1 tablet (10 mg total) by mouth 4 (four) times daily. For migraine or headache or  nausea 30 tablet 6   promethazine (PHENERGAN) 25 MG tablet Take 1 tablet (25 mg total) by mouth every 8 (eight) hours as needed for nausea or vomiting. 30 tablet 2   No current facility-administered medications for this visit.     Allergies as of 08/30/2019 - Review Complete 08/30/2019  Allergen Reaction Noted   Benazepril Anaphylaxis and Swelling 09/15/2016   Ondansetron hcl Hives 07/05/2017   Codeine Nausea And Vomiting 02/21/2015   Morphine Hives 07/05/2017    Vitals: BP 137/90 (BP Location: Right Arm, Patient Position: Sitting)    Pulse 78    Temp 98.2 F (36.8 C) Comment: cousin 97.9, taken at front door   Ht '5\' 6"'$  (1.676 m)    Wt 190 lb (86.2 kg)    BMI 30.67 kg/m  Last Weight:  Wt Readings from Last 1 Encounters:  09/11/19 192 lb 12.8 oz (87.5 kg)   Last Height:   Ht Readings from Last 1 Encounters:  08/30/19 '5\' 6"'$  (1.676 m)     Physical exam: Exam: Gen: NAD, conversant, well nourised, obese, well groomed                     CV: RRR, no MRG. No Carotid Bruits. No peripheral edema, warm, nontender Eyes: Conjunctivae clear without exudates or hemorrhage  Neuro: Detailed Neurologic Exam  Speech:    Speech is normal; fluent and spontaneous with normal comprehension.  Cognition:    The patient is oriented to person, place, and time;     recent and remote memory intact;     language fluent;     normal attention, concentration,     fund of knowledge Cranial Nerves:    The pupils are equal, round, and reactive to light. Fundi are flat. Visual fields are full to finger confrontation. Vertical ophthalmoplegia and proptosis. Trigeminal sensation is intact and the muscles of mastication are normal. The face is symmetric. The palate elevates in the midline. Hearing intact. Voice is normal. Shoulder shrug is normal. The tongue has normal motion without fasciculations.   Coordination:    Normal finger to nose  Gait:  Normal native gait  Motor Observation:    No  asymmetry, no atrophy, and no involuntary movements noted. Tone:    Normal muscle tone.    Posture:    Posture is normal. normal erect    Strength:    Strength is V/V in the upper and lower limbs.      Sensation: intact to LT     Reflex Exam:  DTR's:    Deep tendon reflexes in the upper and lower extremities are symmetrical Toes:    The toes are downgoing bilaterally.   Clonus:    Clonus is absent.    Assessment/Plan:  Lovely patient with worsening headaches and possible aneurysm (MRI 2006 never followed up), also  vertical opthalmoplegia and proptosis. She needs MRI of the brain and MRA of the head to evaluate for SAH, enlarging Aneurysm, space occupying mass or compressive mass or complications from her multiple myeloma. 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  Headache acute management: try Reglan. Cannot take triptans due to hx of TIA or stroke.   Neuropathy: pomalyst can cause, also she is pre-diabetic, she has multipl emyeloma, all can be  implicated with distal polyneuropathy. will check hgba1c and b12   Orders Placed This Encounter  Procedures   MR BRAIN W WO CONTRAST   MR ANGIO HEAD WO CONTRAST   CT ANGIO HEAD W OR WO CONTRAST   Hemoglobin A1c   B12 and Folate Panel   Methylmalonic acid, serum   Vitamin B1   Meds ordered this encounter  Medications   metoCLOPramide (REGLAN) 10 MG tablet    Sig: Take 1 tablet (10 mg total) by mouth 4 (four) times daily. For migraine or headache or nausea    Dispense:  30 tablet    Refill:  6   ALPRAZolam (XANAX) 0.25 MG tablet    Sig: Take 1-2 tabs (0.'25mg'$ -0.'50mg'$ ) 30-60 minutes before procedure. May repeat if needed.Do not drive.    Dispense:  4 tablet    Refill:  0    Cc: Mosie Lukes, MD,  Mosie Lukes, MD  Sarina Ill, MD  [facility] [address] [place], [place] [address]  Phone [phone] Fax [phone]

## 2019-08-30 NOTE — Patient Instructions (Addendum)
Reglan(Metaclopramide) as needed for headache or migraine MRI of the brain, take xanax prior if needed   Alprazolam tablets What is this medicine? ALPRAZOLAM (al PRAY zoe lam) is a benzodiazepine. It is used to treat anxiety and panic attacks. This medicine may be used for other purposes; ask your health care provider or pharmacist if you have questions. COMMON BRAND NAME(S): Xanax What should I tell my health care provider before I take this medicine? They need to know if you have any of these conditions:  an alcohol or drug abuse problem  bipolar disorder, depression, psychosis or other mental health conditions  glaucoma  kidney or liver disease  lung or breathing disease  myasthenia gravis  Parkinson's disease  porphyria  seizures or a history of seizures  suicidal thoughts  an unusual or allergic reaction to alprazolam, other benzodiazepines, foods, dyes, or preservatives  pregnant or trying to get pregnant  breast-feeding How should I use this medicine? Take this medicine by mouth with a glass of water. Follow the directions on the prescription label. Take your medicine at regular intervals. Do not take it more often than directed. Do not stop taking except on your doctor's advice. A special MedGuide will be given to you by the pharmacist with each prescription and refill. Be sure to read this information carefully each time. Talk to your pediatrician regarding the use of this medicine in children. Special care may be needed. Overdosage: If you think you have taken too much of this medicine contact a poison control center or emergency room at once. NOTE: This medicine is only for you. Do not share this medicine with others. What if I miss a dose? If you miss a dose, take it as soon as you can. If it is almost time for your next dose, take only that dose. Do not take double or extra doses. What may interact with this medicine? Do not take this medicine with any of  the following medications:  certain antiviral medicines for HIV or AIDS like delavirdine, indinavir  certain medicines for fungal infections like ketoconazole and itraconazole  narcotic medicines for cough  sodium oxybate This medicine may also interact with the following medications:  alcohol  antihistamines for allergy, cough and cold  certain antibiotics like clarithromycin, erythromycin, isoniazid, rifampin, rifapentine, rifabutin, and troleandomycin  certain medicines for blood pressure, heart disease, irregular heart beat  certain medicines for depression, like amitriptyline, fluoxetine, sertraline  certain medicines for seizures like carbamazepine, oxcarbazepine, phenobarbital, phenytoin, primidone  cimetidine  cyclosporine  female hormones, like estrogens or progestins and birth control pills, patches, rings, or injections  general anesthetics like halothane, isoflurane, methoxyflurane, propofol  grapefruit juice  local anesthetics like lidocaine, pramoxine, tetracaine  medicines that relax muscles for surgery  narcotic medicines for pain  other antiviral medicines for HIV or AIDS  phenothiazines like chlorpromazine, mesoridazine, prochlorperazine, thioridazine This list may not describe all possible interactions. Give your health care provider a list of all the medicines, herbs, non-prescription drugs, or dietary supplements you use. Also tell them if you smoke, drink alcohol, or use illegal drugs. Some items may interact with your medicine. What should I watch for while using this medicine? Tell your doctor or health care professional if your symptoms do not start to get better or if they get worse. Do not stop taking except on your doctor's advice. You may develop a severe reaction. Your doctor will tell you how much medicine to take. You may get drowsy or dizzy. Do  not drive, use machinery, or do anything that needs mental alertness until you know how this  medicine affects you. To reduce the risk of dizzy and fainting spells, do not stand or sit up quickly, especially if you are an older patient. Alcohol may increase dizziness and drowsiness. Avoid alcoholic drinks. If you are taking another medicine that also causes drowsiness, you may have more side effects. Give your health care provider a list of all medicines you use. Your doctor will tell you how much medicine to take. Do not take more medicine than directed. Call emergency for help if you have problems breathing or unusual sleepiness. What side effects may I notice from receiving this medicine? Side effects that you should report to your doctor or health care professional as soon as possible:  allergic reactions like skin rash, itching or hives, swelling of the face, lips, or tongue  breathing problems  confusion  loss of balance or coordination  signs and symptoms of low blood pressure like dizziness; feeling faint or lightheaded, falls; unusually weak or tired  suicidal thoughts or other mood changes Side effects that usually do not require medical attention (report to your doctor or health care professional if they continue or are bothersome):  dizziness  dry mouth  nausea, vomiting  tiredness This list may not describe all possible side effects. Call your doctor for medical advice about side effects. You may report side effects to FDA at 1-800-FDA-1088. Where should I keep my medicine? Keep out of the reach of children. This medicine can be abused. Keep your medicine in a safe place to protect it from theft. Do not share this medicine with anyone. Selling or giving away this medicine is dangerous and against the law. Store at room temperature between 20 and 25 degrees C (68 and 77 degrees F). This medicine may cause accidental overdose and death if taken by other adults, children, or pets. Mix any unused medicine with a substance like cat litter or coffee grounds. Then throw the  medicine away in a sealed container like a sealed bag or a coffee can with a lid. Do not use the medicine after the expiration date. NOTE: This sheet is a summary. It may not cover all possible information. If you have questions about this medicine, talk to your doctor, pharmacist, or health care provider.  2020 Elsevier/Gold Standard (2015-07-25 13:47:25)  Metoclopramide tablets What is this medicine? METOCLOPRAMIDE (met oh kloe PRA mide) is used to treat the symptoms of gastroesophageal reflux disease (GERD) like heartburn. It is also used to treat people with slow emptying of the stomach and intestinal tract. This medicine may be used for other purposes; ask your health care provider or pharmacist if you have questions. COMMON BRAND NAME(S): Reglan What should I tell my health care provider before I take this medicine? They need to know if you have any of these conditions:  breast cancer  depression  diabetes  heart failure  high blood pressure  kidney disease  liver disease  Parkinson's disease or a movement disorder  pheochromocytoma  seizures  stomach obstruction, bleeding, or perforation  an unusual or allergic reaction to metoclopramide, procainamide, sulfites, other medicines, foods, dyes, or preservatives  pregnant or trying to get pregnant  breast-feeding How should I use this medicine? Take this medicine by mouth with a glass of water. Follow the directions on the prescription label. Take this medicine on an empty stomach, about 30 minutes before eating. Take your doses at regular intervals. Do  not take your medicine more often than directed. Do not stop taking except on the advice of your doctor or health care professional. A special MedGuide will be given to you by the pharmacist with each prescription and refill. Be sure to read this information carefully each time. Talk to your pediatrician regarding the use of this medicine in children. Special care may be  needed. Overdosage: If you think you have taken too much of this medicine contact a poison control center or emergency room at once. NOTE: This medicine is only for you. Do not share this medicine with others. What if I miss a dose? If you miss a dose, take it as soon as you can. If it is almost time for your next dose, take only that dose. Do not take double or extra doses. What may interact with this medicine?  acetaminophen  cyclosporine  digoxin  medicines for blood pressure  medicines for diabetes, including insulin  medicines for hay fever and other allergies  medicines for depression, especially a Monoamine Oxidase Inhibitor (MAOI)  medicines for Parkinson's disease, like levodopa  medicines for sleep or for pain  quinidine  tetracycline This list may not describe all possible interactions. Give your health care provider a list of all the medicines, herbs, non-prescription drugs, or dietary supplements you use. Also tell them if you smoke, drink alcohol, or use illegal drugs. Some items may interact with your medicine. What should I watch for while using this medicine? It may take a few weeks for your stomach condition to start to get better. However, do not take this medicine for longer than 12 weeks. The longer you take this medicine, and the more you take it, the greater your chances are of developing serious side effects. If you are an elderly patient, a female patient, or you have diabetes, you may be at an increased risk for side effects from this medicine. Contact your doctor immediately if you start having movements you cannot control such as lip smacking, rapid movements of the tongue, involuntary or uncontrollable movements of the eyes, head, arms and legs, or muscle twitches and spasms. Patients and their families should watch out for worsening depression or thoughts of suicide. Also watch out for any sudden or severe changes in feelings such as feeling anxious,  agitated, panicky, irritable, hostile, aggressive, impulsive, severely restless, overly excited and hyperactive, or not being able to sleep. If this happens, especially at the beginning of treatment or after a change in dose, call your doctor. Do not treat yourself for high fever. Ask your doctor or health care professional for advice. You may get drowsy or dizzy. Do not drive, use machinery, or do anything that needs mental alertness until you know how this drug affects you. Do not stand or sit up quickly, especially if you are an older patient. This reduces the risk of dizzy or fainting spells. Alcohol can make you more drowsy and dizzy. Avoid alcoholic drinks. What side effects may I notice from receiving this medicine? Side effects that you should report to your doctor or health care professional as soon as possible:  allergic reactions like skin rash, itching or hives, swelling of the face, lips, or tongue  abnormal production of milk in females  breast enlargement in both males and females  change in the way you walk  difficulty moving, speaking or swallowing  drooling, lip smacking, or rapid movements of the tongue  excessive sweating  fever  involuntary or uncontrollable movements of the  eyes, head, arms and legs  irregular heartbeat or palpitations  muscle twitches and spasms  unusually weak or tired Side effects that usually do not require medical attention (report to your doctor or health care professional if they continue or are bothersome):  change in sex drive or performance  depressed mood  diarrhea  difficulty sleeping  headache  menstrual changes  restless or nervous This list may not describe all possible side effects. Call your doctor for medical advice about side effects. You may report side effects to FDA at 1-800-FDA-1088. Where should I keep my medicine? Keep out of the reach of children. Store at room temperature between 20 and 25 degrees C (68  and 77 degrees F). Protect from light. Keep container tightly closed. Throw away any unused medicine after the expiration date. NOTE: This sheet is a summary. It may not cover all possible information. If you have questions about this medicine, talk to your doctor, pharmacist, or health care provider.  2020 Elsevier/Gold Standard (2016-08-12 15:13:45) Pomalidomide oral capsules What is this medicine? POMALIDOMIDE (pom a LID oh mide) is a chemotherapy drug used to treat multiple myeloma. It targets specific proteins within cancer cells and stops the cancer cell from growing. This medicine may be used for other purposes; ask your health care provider or pharmacist if you have questions. COMMON BRAND NAME(S): POMALYST What should I tell my health care provider before I take this medicine? They need to know if you have any of these conditions:  high blood pressure  high cholesterol  history of blood clots  irregular monthly periods or menstrual cycles  kidney disease  liver disease  smoke tobacco  an unusual or allergic reaction to pomalidomide, other medicines, foods, dyes, or preservatives  pregnant or trying to get pregnant  breast-feeding How should I use this medicine? Take this medicine by mouth with a glass of water. Follow the directions on the prescription label. You can take it with or without food. If it upsets your stomach, take it with food. Do not cut, crush, or chew this medicine. Take your medicine at regular intervals. Do not take it more often than directed. Do not stop taking except on your doctor's advice. A special MedGuide will be given to you by the pharmacist with each prescription and refill. Be sure to read this information carefully each time. Talk to your pediatrician regarding the use of this medicine in children. Special care may be needed. Overdosage: If you think you have taken too much of this medicine contact a poison control center or emergency room  at once. NOTE: This medicine is only for you. Do not share this medicine with others. What if I miss a dose? If you miss a dose, take it as soon as you can. If your next dose is to be taken in less than 12 hours, then do not take the missed dose. Take the next dose at your regular time. Do not take double or extra doses. What may interact with this medicine? This medicine may interact with the following medications:  ciprofloxacin  fluvoxamine  tobacco (cigarettes) This list may not describe all possible interactions. Give your health care provider a list of all the medicines, herbs, non-prescription drugs, or dietary supplements you use. Also tell them if you smoke, drink alcohol, or use illegal drugs. Some items may interact with your medicine. What should I watch for while using this medicine? This drug may make you feel generally unwell. This is not uncommon, as  chemotherapy can affect healthy cells as well as cancer cells. Report any side effects. Continue your course of treatment even though you feel ill unless your doctor tells you to stop. You may need blood work done while you are taking this medicine. This medicine may cause serious skin reactions. They can happen weeks to months after starting the medicine. Contact your health care provider right away if you notice fevers or flu-like symptoms with a rash. The rash may be red or purple and then turn into blisters or peeling of the skin. Or, you might notice a red rash with swelling of the face, lips or lymph nodes in your neck or under your arms. This medicine is available only through a special program. Doctors, pharmacies, and patients must meet all of the conditions of the program. Your health care provider will help you get signed up with the program if you need this medicine. Through the program you will only receive up to a 28 day supply of the medicine at one time. You will need a new prescription for each refill. This medicine can  cause birth defects. Do not get pregnant for at least 4 weeks before, during, and for at least 4 weeks after taking this drug. Females with child-bearing potential will need to have 2 negative pregnancy tests before starting this medicine. Pregnancy testing must be done every 2 to 4 weeks as directed while taking this medicine. Use 2 reliable forms of birth control together while you are taking this medicine and for 4 weeks after you stop taking this medicine. If you think that you might be pregnant talk to your doctor right away. Men must use a latex condom during sexual contact with a woman while taking this medicine and for 4 weeks after you stop taking this medicine. A latex condom is needed even if you have had a vasectomy. Contact your doctor right away if your partner becomes pregnant. Do not donate sperm while taking this medicine and for 4 weeks after you stop taking this medicine. Do not give blood while taking the medicine and for 1 month after completion of treatment to avoid exposing pregnant women to the medicine through the donated blood. Talk to your doctor about your risk of cancer. You may be more at risk for certain types of cancers if you take this medicine. If you smoke, tell your doctor if you notice this medicine is not working well for you. Talk to your doctor if you are a smoker or if you decide to stop smoking. What side effects may I notice from receiving this medicine? Side effects that you should report to your doctor or health care professional as soon as possible:  allergic reactions like skin rash, itching or hives, swelling of the face, lips, or tongue  fast heartbeat  feeling faint  low blood counts - this medicine may decrease the number of white blood cells, red blood cells and platelets. You may be at increased risk for infections and bleeding  rash, fever, and swollen lymph nodes  redness, blistering, peeling or loosening of the skin, including inside the  mouth  signs and symptoms of a blood clot such as breathing problems; changes in vision; chest pain; severe, sudden headache; pain, swelling, warmth in the leg; trouble speaking; sudden numbness or weakness of the face, arm or leg  signs and symptoms of liver injury like dark yellow or brown urine; general ill feeling or flu-like symptoms; light-colored stools; loss of appetite; nausea; right upper  belly pain; unusually weak or tired; yellowing of the eyes or skin  signs and symptoms of a stroke like changes in vision; confusion; trouble speaking or understanding; severe headaches; sudden numbness or weakness of the face, arm or leg; trouble walking; dizziness; loss of balance or coordination  sweating  tingling, numbness in the hands or feet  trouble swallowing  unusual bleeding or bruising Side effects that usually do not require medical attention (report to your doctor or health care professional if they continue or are bothersome):  back pain  constipation  diarrhea  nausea  tiredness This list may not describe all possible side effects. Call your doctor for medical advice about side effects. You may report side effects to FDA at 1-800-FDA-1088. Where should I keep my medicine? Keep out of the reach of children. Store between 20 and 25 degrees C (68 and 77 degrees F). Throw away any unused medicine after the expiration date. NOTE: This sheet is a summary. It may not cover all possible information. If you have questions about this medicine, talk to your doctor, pharmacist, or health care provider.  2020 Elsevier/Gold Standard (2019-02-03 14:44:13)

## 2019-08-31 ENCOUNTER — Other Ambulatory Visit: Payer: Self-pay | Admitting: *Deleted

## 2019-08-31 ENCOUNTER — Telehealth: Payer: Self-pay | Admitting: Neurology

## 2019-08-31 DIAGNOSIS — C9000 Multiple myeloma not having achieved remission: Secondary | ICD-10-CM

## 2019-08-31 NOTE — Telephone Encounter (Signed)
[employer] medicare/medicaid order sent to [facility]. No auth they will reach out to the patient to schedule.  °

## 2019-09-01 ENCOUNTER — Inpatient Hospital Stay: Payer: Medicare Other

## 2019-09-01 ENCOUNTER — Other Ambulatory Visit: Payer: Self-pay

## 2019-09-01 VITALS — BP 134/78 | HR 89 | Temp 97.3°F | Resp 18

## 2019-09-01 DIAGNOSIS — C9000 Multiple myeloma not having achieved remission: Secondary | ICD-10-CM | POA: Diagnosis not present

## 2019-09-01 DIAGNOSIS — Z5112 Encounter for antineoplastic immunotherapy: Secondary | ICD-10-CM | POA: Diagnosis not present

## 2019-09-01 LAB — COMPREHENSIVE METABOLIC PANEL
ALT: 12 U/L (ref 0–44)
AST: 20 U/L (ref 15–41)
Albumin: 4 g/dL (ref 3.5–5.0)
Alkaline Phosphatase: 42 U/L (ref 38–126)
Anion gap: 8 (ref 5–15)
BUN: 20 mg/dL (ref 8–23)
CO2: 28 mmol/L (ref 22–32)
Calcium: 9.6 mg/dL (ref 8.9–10.3)
Chloride: 106 mmol/L (ref 98–111)
Creatinine, Ser: 0.79 mg/dL (ref 0.44–1.00)
GFR calc Af Amer: >60 mL/min (ref >60)
GFR calc non Af Amer: >60 mL/min (ref >60)
Glucose, Bld: 110 mg/dL — ABNORMAL HIGH (ref 70–99)
Potassium: 3.4 mmol/L — ABNORMAL LOW (ref 3.5–5.1)
Sodium: 142 mmol/L (ref 135–145)
Total Bilirubin: 0.4 mg/dL (ref 0.3–1.2)
Total Protein: 8.1 g/dL (ref 6.5–8.1)

## 2019-09-01 LAB — CBC WITH DIFFERENTIAL (CANCER CENTER ONLY)
Abs Immature Granulocytes: 0 10*3/uL (ref 0.00–0.07)
Basophils Absolute: 0 10*3/uL (ref 0.0–0.1)
Basophils Relative: 1 %
Eosinophils Absolute: 0 10*3/uL (ref 0.0–0.5)
Eosinophils Relative: 1 %
HCT: 35.3 % — ABNORMAL LOW (ref 36.0–46.0)
Hemoglobin: 11.4 g/dL — ABNORMAL LOW (ref 12.0–15.0)
Immature Granulocytes: 0 %
Lymphocytes Relative: 40 %
Lymphs Abs: 1.5 10*3/uL (ref 0.7–4.0)
MCH: 29.2 pg (ref 26.0–34.0)
MCHC: 32.3 g/dL (ref 30.0–36.0)
MCV: 90.3 fL (ref 80.0–100.0)
Monocytes Absolute: 0.4 10*3/uL (ref 0.1–1.0)
Monocytes Relative: 11 %
Neutro Abs: 1.8 10*3/uL (ref 1.7–7.7)
Neutrophils Relative %: 47 %
Platelet Count: 92 10*3/uL — ABNORMAL LOW (ref 150–400)
RBC: 3.91 MIL/uL (ref 3.87–5.11)
RDW: 13.4 % (ref 11.5–15.5)
WBC Count: 3.7 10*3/uL — ABNORMAL LOW (ref 4.0–10.5)
nRBC: 0 % (ref 0.0–0.2)

## 2019-09-01 MED ORDER — PROCHLORPERAZINE MALEATE 10 MG PO TABS: 10.0000 mg | ORAL_TABLET | Freq: Once | ORAL | Status: DC

## 2019-09-01 MED ORDER — BORTEZOMIB CHEMO SQ INJECTION 3.5 MG (2.5MG/ML)
1.3000 mg/m2 | Freq: Once | INTRAMUSCULAR | Status: AC
  Administered 2019-09-01: 2.5 mg via SUBCUTANEOUS
  Filled 2019-09-01: qty 1

## 2019-09-01 NOTE — Patient Instructions (Signed)
[facility] Discharge Instructions for Patients Receiving Chemotherapy  Today you received the following chemotherapy agents Velcade. To help prevent nausea and vomiting after your treatment, we encourage you to take your nausea medication as directed.  If you develop nausea and vomiting that is not controlled by your nausea medication, call the clinic.   BELOW ARE SYMPTOMS THAT SHOULD BE REPORTED IMMEDIATELY:  *FEVER GREATER THAN 100.5 F  *CHILLS WITH OR WITHOUT FEVER  NAUSEA AND VOMITING THAT IS NOT CONTROLLED WITH YOUR NAUSEA MEDICATION  *UNUSUAL SHORTNESS OF BREATH  *UNUSUAL BRUISING OR BLEEDING  TENDERNESS IN MOUTH AND THROAT WITH OR WITHOUT PRESENCE OF ULCERS  *URINARY PROBLEMS  *BOWEL PROBLEMS  UNUSUAL RASH Items with * indicate a potential emergency and should be followed up as soon as possible.  Feel free to call the clinic you have any questions or concerns. The clinic phone number is [phone].  Please show the CHEMO ALERT CARD at check-in to the Emergency Department and triage nurse.    

## 2019-09-01 NOTE — Progress Notes (Signed)
Okay to treat today with pltc 92 per Laverna Peace, NP.

## 2019-09-03 LAB — METHYLMALONIC ACID, SERUM: Methylmalonic Acid: 103 nmol/L (ref 0–378)

## 2019-09-03 LAB — VITAMIN B1: Thiamine: 95.8 nmol/L (ref 66.5–200.0)

## 2019-09-03 LAB — B12 AND FOLATE PANEL
Folate: 8.6 ng/mL (ref >3.0)
Vitamin B-12: 315 pg/mL (ref 232–1245)

## 2019-09-03 LAB — HEMOGLOBIN A1C
Est. average glucose Bld gHb Est-mCnc: 111 mg/dL
Hgb A1c MFr Bld: 5.5 % (ref 4.8–5.6)

## 2019-09-04 ENCOUNTER — Telehealth: Payer: Self-pay | Admitting: *Deleted

## 2019-09-04 NOTE — Telephone Encounter (Signed)
-----   Message from Melvenia Beam, MD sent at 09/04/2019  8:51 AM EDT ----- Labs normal thanks

## 2019-09-04 NOTE — Telephone Encounter (Signed)
Spoke with pt and advised pt labs normal. She verbalized understanding and appreciation for the call. Denied any questions. Next appt 10/17/2019 @ 2 pm.

## 2019-09-06 DIAGNOSIS — J302 Other seasonal allergic rhinitis: Secondary | ICD-10-CM | POA: Diagnosis not present

## 2019-09-06 DIAGNOSIS — R030 Elevated blood-pressure reading, without diagnosis of hypertension: Secondary | ICD-10-CM | POA: Diagnosis not present

## 2019-09-06 DIAGNOSIS — G43809 Other migraine, not intractable, without status migrainosus: Secondary | ICD-10-CM | POA: Diagnosis not present

## 2019-09-06 DIAGNOSIS — H6121 Impacted cerumen, right ear: Secondary | ICD-10-CM | POA: Diagnosis not present

## 2019-09-07 ENCOUNTER — Other Ambulatory Visit: Payer: Self-pay | Admitting: *Deleted

## 2019-09-07 DIAGNOSIS — C9000 Multiple myeloma not having achieved remission: Secondary | ICD-10-CM

## 2019-09-07 NOTE — Telephone Encounter (Signed)
Done. thanks

## 2019-09-07 NOTE — Telephone Encounter (Signed)
Pt called and stated she couldn't get her MRI because she has a pacemaker. She wants to know what the next option will be

## 2019-09-07 NOTE — Telephone Encounter (Signed)
Spoke to the patient and got her pacemaker info.  Name: medtronic - axa/an everaxtdr defbrollatior implant Model # [id number], [id number] & [id number] Serial # [id number] H, [id number] & [id number] V Implant date: Sep 06, 2014  Please put new MRI orders in for [facility]. Thank you!

## 2019-09-07 NOTE — Addendum Note (Signed)
Addended by: Sarina Ill B on: 09/07/2019 04:48 PM   Modules accepted: Orders

## 2019-09-08 ENCOUNTER — Inpatient Hospital Stay: Payer: Medicare Other

## 2019-09-08 ENCOUNTER — Other Ambulatory Visit: Payer: Self-pay

## 2019-09-08 VITALS — BP 149/90 | HR 80 | Temp 98.0°F

## 2019-09-08 DIAGNOSIS — E042 Nontoxic multinodular goiter: Secondary | ICD-10-CM | POA: Diagnosis not present

## 2019-09-08 DIAGNOSIS — Z5112 Encounter for antineoplastic immunotherapy: Secondary | ICD-10-CM | POA: Diagnosis not present

## 2019-09-08 DIAGNOSIS — C9000 Multiple myeloma not having achieved remission: Secondary | ICD-10-CM

## 2019-09-08 DIAGNOSIS — R42 Dizziness and giddiness: Secondary | ICD-10-CM | POA: Diagnosis not present

## 2019-09-08 DIAGNOSIS — R110 Nausea: Secondary | ICD-10-CM | POA: Diagnosis not present

## 2019-09-08 DIAGNOSIS — H9203 Otalgia, bilateral: Secondary | ICD-10-CM | POA: Diagnosis not present

## 2019-09-08 DIAGNOSIS — G4733 Obstructive sleep apnea (adult) (pediatric): Secondary | ICD-10-CM | POA: Diagnosis not present

## 2019-09-08 DIAGNOSIS — R519 Headache, unspecified: Secondary | ICD-10-CM | POA: Diagnosis not present

## 2019-09-08 DIAGNOSIS — I6523 Occlusion and stenosis of bilateral carotid arteries: Secondary | ICD-10-CM | POA: Diagnosis not present

## 2019-09-08 DIAGNOSIS — R079 Chest pain, unspecified: Secondary | ICD-10-CM | POA: Diagnosis not present

## 2019-09-08 LAB — CMP (CANCER CENTER ONLY)
ALT: 14 U/L (ref 0–44)
AST: 19 U/L (ref 15–41)
Albumin: 4.1 g/dL (ref 3.5–5.0)
Alkaline Phosphatase: 44 U/L (ref 38–126)
Anion gap: 7 (ref 5–15)
BUN: 22 mg/dL (ref 8–23)
CO2: 28 mmol/L (ref 22–32)
Calcium: 9.5 mg/dL (ref 8.9–10.3)
Chloride: 107 mmol/L (ref 98–111)
Creatinine: 0.78 mg/dL (ref 0.44–1.00)
GFR, Est AFR Am: >60 mL/min (ref >60)
GFR, Estimated: >60 mL/min (ref >60)
Glucose, Bld: 87 mg/dL (ref 70–99)
Potassium: 3.5 mmol/L (ref 3.5–5.1)
Sodium: 142 mmol/L (ref 135–145)
Total Bilirubin: 0.3 mg/dL (ref 0.3–1.2)
Total Protein: 7.9 g/dL (ref 6.5–8.1)

## 2019-09-08 LAB — CBC WITH DIFFERENTIAL (CANCER CENTER ONLY)
Abs Immature Granulocytes: 0.01 10*3/uL (ref 0.00–0.07)
Basophils Absolute: 0 10*3/uL (ref 0.0–0.1)
Basophils Relative: 1 %
Eosinophils Absolute: 0.1 10*3/uL (ref 0.0–0.5)
Eosinophils Relative: 4 %
HCT: 36.2 % (ref 36.0–46.0)
Hemoglobin: 11.6 g/dL — ABNORMAL LOW (ref 12.0–15.0)
Immature Granulocytes: 0 %
Lymphocytes Relative: 36 %
Lymphs Abs: 1 10*3/uL (ref 0.7–4.0)
MCH: 29.4 pg (ref 26.0–34.0)
MCHC: 32 g/dL (ref 30.0–36.0)
MCV: 91.9 fL (ref 80.0–100.0)
Monocytes Absolute: 0.3 10*3/uL (ref 0.1–1.0)
Monocytes Relative: 11 %
Neutro Abs: 1.3 10*3/uL — ABNORMAL LOW (ref 1.7–7.7)
Neutrophils Relative %: 48 %
Platelet Count: 125 10*3/uL — ABNORMAL LOW (ref 150–400)
RBC: 3.94 MIL/uL (ref 3.87–5.11)
RDW: 13.6 % (ref 11.5–15.5)
WBC Count: 2.7 10*3/uL — ABNORMAL LOW (ref 4.0–10.5)
nRBC: 0 % (ref 0.0–0.2)

## 2019-09-08 MED ORDER — BORTEZOMIB CHEMO SQ INJECTION 3.5 MG (2.5MG/ML)
1.3000 mg/m2 | Freq: Once | INTRAMUSCULAR | Status: AC
  Administered 2019-09-08: 2.5 mg via SUBCUTANEOUS
  Filled 2019-09-08: qty 1

## 2019-09-08 MED ORDER — PROCHLORPERAZINE MALEATE 10 MG PO TABS: 10.0000 mg | ORAL_TABLET | Freq: Once | ORAL | Status: DC

## 2019-09-08 NOTE — Patient Instructions (Signed)
[facility] Discharge Instructions for Patients Receiving Chemotherapy  Today you received the following chemotherapy agents Velcade To help prevent nausea and vomiting after your treatment, we encourage you to take your nausea medication as prescribed.   If you develop nausea and vomiting that is not controlled by your nausea medication, call the clinic.   BELOW ARE SYMPTOMS THAT SHOULD BE REPORTED IMMEDIATELY:  *FEVER GREATER THAN 100.5 F  *CHILLS WITH OR WITHOUT FEVER  NAUSEA AND VOMITING THAT IS NOT CONTROLLED WITH YOUR NAUSEA MEDICATION  *UNUSUAL SHORTNESS OF BREATH  *UNUSUAL BRUISING OR BLEEDING  TENDERNESS IN MOUTH AND THROAT WITH OR WITHOUT PRESENCE OF ULCERS  *URINARY PROBLEMS  *BOWEL PROBLEMS  UNUSUAL RASH Items with * indicate a potential emergency and should be followed up as soon as possible.  Feel free to call the clinic should you have any questions or concerns. The clinic phone number is [phone].  Please show the CHEMO ALERT CARD at check-in to the Emergency Department and triage nurse.   

## 2019-09-08 NOTE — Progress Notes (Signed)
OK to treat today with ANC-1.3 per order of Dr. Marin Olp.

## 2019-09-11 ENCOUNTER — Other Ambulatory Visit: Payer: Self-pay

## 2019-09-11 ENCOUNTER — Ambulatory Visit (INDEPENDENT_AMBULATORY_CARE_PROVIDER_SITE_OTHER): Payer: Medicare Other | Admitting: Family Medicine

## 2019-09-11 ENCOUNTER — Encounter (HOSPITAL_COMMUNITY): Payer: Self-pay | Admitting: Radiology

## 2019-09-11 ENCOUNTER — Encounter: Payer: Self-pay | Admitting: Family Medicine

## 2019-09-11 DIAGNOSIS — J329 Chronic sinusitis, unspecified: Secondary | ICD-10-CM | POA: Diagnosis not present

## 2019-09-11 DIAGNOSIS — I10 Essential (primary) hypertension: Secondary | ICD-10-CM | POA: Diagnosis not present

## 2019-09-11 DIAGNOSIS — H6983 Other specified disorders of Eustachian tube, bilateral: Secondary | ICD-10-CM | POA: Diagnosis not present

## 2019-09-11 DIAGNOSIS — R519 Headache, unspecified: Secondary | ICD-10-CM | POA: Diagnosis not present

## 2019-09-11 MED ORDER — PROMETHAZINE HCL 25 MG PO TABS: 25.0000 mg | ORAL_TABLET | Freq: Three times a day (TID) | ORAL | 2 refills | Status: DC | PRN

## 2019-09-11 MED ORDER — LORATADINE 10 MG PO TABS: 10.0000 mg | ORAL_TABLET | Freq: Two times a day (BID) | ORAL | 11 refills | Status: DC | PRN

## 2019-09-11 MED ORDER — AMLODIPINE BESYLATE 5 MG PO TABS: ORAL_TABLET | ORAL | 1 refills | Status: DC

## 2019-09-11 MED ORDER — AMOXICILLIN-POT CLAVULANATE 875-125 MG PO TABS: 1.0000 | ORAL_TABLET | Freq: Two times a day (BID) | ORAL | 0 refills | Status: DC

## 2019-09-11 NOTE — Patient Instructions (Addendum)
Use the Flonase daily Claritin 10 mg twice a day Nasal saline flushes to nose twice a day   Then try and plug your nose and gently blow after using the nasal saline flushes  Eustachian Tube Dysfunction  Eustachian tube dysfunction refers to a condition in which a blockage develops in the narrow passage that connects the middle ear to the back of the nose (eustachian tube). The eustachian tube regulates air pressure in the middle ear by letting air move between the ear and nose. It also helps to drain fluid from the middle ear space. Eustachian tube dysfunction can affect one or both ears. When the eustachian tube does not function properly, air pressure, fluid, or both can build up in the middle ear. What are the causes? This condition occurs when the eustachian tube becomes blocked or cannot open normally. Common causes of this condition include:  Ear infections.  Colds and other infections that affect the nose, mouth, and throat (upper respiratory tract).  Allergies.  Irritation from cigarette smoke.  Irritation from stomach acid coming up into the esophagus (gastroesophageal reflux). The esophagus is the tube that carries food from the mouth to the stomach.  Sudden changes in air pressure, such as from descending in an airplane or scuba diving.  Abnormal growths in the nose or throat, such as: ? Growths that line the nose (nasal polyps). ? Abnormal growth of cells (tumors). ? Enlarged tissue at the back of the throat (adenoids). What increases the risk? You are more likely to develop this condition if:  You smoke.  You are overweight.  You are a child who has: ? Certain birth defects of the mouth, such as cleft palate. ? Large tonsils or adenoids. What are the signs or symptoms? Common symptoms of this condition include:  A feeling of fullness in the ear.  Ear pain.  Clicking or popping noises in the ear.  Ringing in the ear.  Hearing loss.  Loss of  balance.  Dizziness. Symptoms may get worse when the air pressure around you changes, such as when you travel to an area of high elevation, fly on an airplane, or go scuba diving. How is this diagnosed? This condition may be diagnosed based on:  Your symptoms.  A physical exam of your ears, nose, and throat.  Tests, such as those that measure: ? The movement of your eardrum (tympanogram). ? Your hearing (audiometry). How is this treated? Treatment depends on the cause and severity of your condition.  In mild cases, you may relieve your symptoms by moving air into your ears. This is called "popping the ears."  In more severe cases, or if you have symptoms of fluid in your ears, treatment may include: ? Medicines to relieve congestion (decongestants). ? Medicines that treat allergies (antihistamines). ? Nasal sprays or ear drops that contain medicines that reduce swelling (steroids). ? A procedure to drain the fluid in your eardrum (myringotomy). In this procedure, a small tube is placed in the eardrum to:  Drain the fluid.  Restore the air in the middle ear space. ? A procedure to insert a balloon device through the nose to inflate the opening of the eustachian tube (balloon dilation). Follow these instructions at home: Lifestyle  Do not do any of the following until your health care provider approves: ? Travel to high altitudes. ? Fly in airplanes. ? Work in a Pension scheme manager or room. ? Scuba dive.  Do not use any products that contain nicotine or tobacco, such  as cigarettes and e-cigarettes. If you need help quitting, ask your health care provider.  Keep your ears dry. Wear fitted earplugs during showering and bathing. Dry your ears completely after. General instructions  Take over-the-counter and prescription medicines only as told by your health care provider.  Use techniques to help pop your ears as recommended by your health care provider. These may  include: ? Chewing gum. ? Yawning. ? Frequent, forceful swallowing. ? Closing your mouth, holding your nose closed, and gently blowing as if you are trying to blow air out of your nose.  Keep all follow-up visits as told by your health care provider. This is important. Contact a health care provider if:  Your symptoms do not go away after treatment.  Your symptoms come back after treatment.  You are unable to pop your ears.  You have: ? A fever. ? Pain in your ear. ? Pain in your head or neck. ? Fluid draining from your ear.  Your hearing suddenly changes.  You become very dizzy.  You lose your balance. Summary  Eustachian tube dysfunction refers to a condition in which a blockage develops in the eustachian tube.  It can be caused by ear infections, allergies, inhaled irritants, or abnormal growths in the nose or throat.  Symptoms include ear pain, hearing loss, or ringing in the ears.  Mild cases are treated with maneuvers to unblock the ears, such as yawning or ear popping.  Severe cases are treated with medicines. Surgery may also be done (rare). This information is not intended to replace advice given to you by your health care provider. Make sure you discuss any questions you have with your health care provider. Document Released: 11/22/2015 Document Revised: 02/15/2018 Document Reviewed: 02/15/2018 Elsevier Patient Education  2020 [employer].

## 2019-09-11 NOTE — Telephone Encounter (Signed)
[facility] called stating that the pacemaker is not MRI safe.

## 2019-09-11 NOTE — Progress Notes (Unsigned)
Pacemaker is not MRI Conditional due to 3830 lead not being in it's traditional port per [employer] Rep

## 2019-09-11 NOTE — Telephone Encounter (Signed)
Thank you. I faxed the information to [facility]. If it is MRI safe they will reach out to the patient to schedule.

## 2019-09-12 DIAGNOSIS — H6983 Other specified disorders of Eustachian tube, bilateral: Secondary | ICD-10-CM | POA: Insufficient documentation

## 2019-09-12 NOTE — Addendum Note (Signed)
Addended by: Sarina Ill B on: 09/12/2019 02:33 PM   Modules accepted: Orders

## 2019-09-12 NOTE — Assessment & Plan Note (Signed)
Improved on recheck but running high consistently. Will continue Amlodipine 10 mg qhs and will add 5 mg qam. Warned to watch for swelling.

## 2019-09-12 NOTE — Assessment & Plan Note (Signed)
She has taken 3 doses of Augmentin over past 2 days and feels that has helped some so will give a 10 day course of Augmentin and then reevaluate

## 2019-09-12 NOTE — Assessment & Plan Note (Signed)
Is established with Dr Jaynee Eagles and they are working on imaging and EEG to rule out cause other than atypical migraine. Due to her pacemaker they were unable to proceed with MRI as hoped for. She was asked for her pacemaker info and realized after the fact she gave the wrong card info. She has had 2 pacemakers one in 2015 and one in 2018 and she gave radiology the info on the 2015 pacer. She has tried to call back and has ben unable to correct so far. Today we did get her card for the 2018 card and upload it into her chart in demographics and FYI for future reference. They may have to proceed with CT instead. For now her HAs are daily the Bupap helps some but will add Promethazine prn for bad HA. She had to present to ED with bad HA recently and the Benadryl, Compazine combo helped. She feels that a sinus infection and some ETD are making the Headaches worse. She has taken 3 doses of Augmentin over past 2 days and feels that has helped some so will give a 10 day course of Augmentin and then reevaluate

## 2019-09-12 NOTE — Assessment & Plan Note (Signed)
R>L. Encouraged Flonase Daily, Claritin bid and nasal saline bid followed by valsalva maneuver.

## 2019-09-12 NOTE — Telephone Encounter (Signed)
I canceled MRIs and ordered CT instead thanks

## 2019-09-12 NOTE — Progress Notes (Signed)
Subjective:    [name], female    DOB: 03-Aug-1956, 63 y.o.   MRN: [id number]  No chief complaint on file.   HPI Patient is in today for ER follow up for headache and hypertension. Is established with Dr Jaynee Eagles and they are working on imaging and EEG to rule out cause other than atypical migraine. Due to her pacemaker they were unable to proceed with MRI as hoped for. She was asked for her pacemaker info and realized after the fact she gave the wrong card info. She has had 2 pacemakers one in 2015 and one in 2018 and she gave radiology the info on the 2015 pacer. She has tried to call back and has ben unable to correct so far. Today we did get her card for the 2018 card and upload it into her chart in demographics and FYI for future reference. They may have to proceed with CT instead. For now her HAs are daily the Bupap helps some. She had to present to ED with bad HA recently and the Benadryl, Compazine combo helped. She feels that a sinus infection and some ETD are making the Headaches worse. She has taken 3 doses of Augmentin over past 2 days and feels that has helped some. No fevers or chills. Her Blood pressure has been consistently high recently often in the 160s over 90s. Denies CP/palp/SOB/fevers/GI or GU c/o. Taking meds as prescribed  Past Medical History:  Diagnosis Date   Abnormal SPEP 07/26/2016   Arthritis    knees, hands   Back pain 07/14/2016   Bowel obstruction ([place]) 11/2014   C. 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   Multiple myeloma ([place]) 07/20/2018    Multiple myeloma not having achieved remission ([place]) 07/20/2018   Myalgia 12/31/2016   Obstructive sleep apnea 03/31/2015   SOB (shortness of breath) 04/09/2016   Stroke ([place])    TIAs   UTI (urinary tract infection)     Past Surgical History:  Procedure Laterality Date   ABDOMINAL HYSTERECTOMY     menorraghia, 2006, total   CHOLECYSTECTOMY     COLONOSCOPY  2018   [facility] per patient   ESOPHAGOGASTRODUODENOSCOPY  2018   [facility]    KNEE SURGERY     right, repair torn torn cartialage   PACEMAKER INSERTION  08/2014   TONSILLECTOMY     TUBAL LIGATION      Family History  Problem Relation Age of Onset   Diabetes Mother    Hypertension Mother    Heart disease Mother        s/p 1 stent   Hyperlipidemia Mother    Arthritis Mother    Cancer Father        COLON   Colon cancer Father 64   Irritable bowel syndrome Sister    Hyperlipidemia Daughter    Hypertension Daughter    Hypertension Maternal Grandmother    Arthritis Maternal Grandmother    Heart disease Maternal Grandfather        MI   Hypertension Maternal Grandfather  Arthritis Maternal Grandfather    Hypertension Son    Esophageal cancer Neg Hx     Social History   Socioeconomic History   Marital status: Divorced    Spouse name: [name]   Number of children: 3   Years of education: [name]   Highest education level: High school graduate  Occupational History   [name]  Social Needs   Financial resource strain: [name]   Food insecurity    Worry: [name]    Inability: [name]   Transportation needs    Medical: [name]    Non-medical: [name]  Tobacco Use   Smoking status: Never Smoker   Smokeless tobacco: Never Used  Substance and Sexual Activity   Alcohol use: No   Drug use: No   Sexual activity: [name]  Lifestyle   Physical activity    Days per week: [name]    Minutes per session:  [name]   Stress: [name]  Relationships   Social connections    Talks on phone: [name]    Gets together: [name]    Attends religious service: [name]    Active member of club or organization: [name]    Attends meetings of clubs or organizations: [name]    Relationship status: [name]   Intimate partner violence    Fear of current or ex partner: [name]    Emotionally abused: [name]    Physically abused: [name]    Forced sexual activity: [name]  Other Topics Concern   [name]  Social History Narrative   Lives at home alone   Retired   Caffeine: coffee    Outpatient Medications Prior to Visit  Medication Sig Dispense Refill   acetaminophen (TYLENOL) 325 MG tablet Take 650 mg by mouth.     ACETAMINOPHEN-BUTALBITAL 50-325 MG TABS Take 1 tablet by mouth 2 (two) times daily as needed. 120 tablet 1   albuterol (PROVENTIL HFA;VENTOLIN HFA) 108 (90 Base) MCG/ACT inhaler Inhale 2 puffs into the lungs every 6 (six) hours as needed for wheezing or shortness of breath. 1 Inhaler 2   ALPRAZolam (XANAX) 0.25 MG tablet Take 1-2 tabs (0.95m-0.50mg) 30-60 minutes before procedure. May repeat if needed.Do not drive. 4 tablet 0   aspirin EC 81 MG tablet Take 1 tablet (81 mg total) by mouth daily. 90 tablet 1   Carboxymethylcellulose Sodium (ARTIFICIAL TEARS OP) Apply to eye.     carvedilol (COREG) 25 MG tablet Take 1 tablet (25 mg total) by mouth 2 (two) times daily with a meal. 180 tablet 1   conjugated estrogens (PREMARIN) vaginal cream Place vaginally.     cyclobenzaprine (FLEXERIL) 10 MG tablet Take 10 mg by mouth as needed.      famciclovir (FAMVIR) 250 MG tablet Take 1 tablet (250 mg total) by mouth daily. 30 tablet 12   famotidine (PEPCID) 40 MG/5ML suspension Take 5 mLs (40 mg total) by mouth at bedtime as needed for heartburn or indigestion. 150 mL 2   fluticasone (FLONASE) 50 MCG/ACT nasal spray Place 2 sprays into  both nostrils daily. (Patient taking differently: Place 2 sprays into both nostrils as needed. ) 16 g 1   furosemide (LASIX) 20 MG tablet Take 1 tablet (20 mg total) by mouth daily as needed for fluid or edema.  30 tablet 2   HYDROcodone-acetaminophen (NORCO/VICODIN) 5-325 MG tablet Take 1 tablet by mouth every 4 (four) hours as needed for moderate pain or severe pain. 20 tablet 0   hyoscyamine (LEVSIN SL) 0.125 MG SL tablet Place 1 tablet (0.125 mg total) under the tongue every 4 (four) hours as needed. 30 tablet 2   lidocaine (LIDODERM) 5 % Place 1 patch onto the skin daily. Remove & Discard patch within 12 hours or as directed by MD 30 patch 2   metoCLOPramide (REGLAN) 10 MG tablet Take 1 tablet (10 mg total) by mouth 4 (four) times daily. For migraine or headache or nausea 30 tablet 6   Multiple Vitamins-Minerals (MULTIVITAMIN ADULT PO) Take by mouth.     ondansetron (ZOFRAN) 4 MG tablet Take 1 tablet (4 mg total) by mouth every 8 (eight) hours as needed for nausea or vomiting. 30 tablet 1   pantoprazole (PROTONIX) 40 MG tablet Take 1 tablet (40 mg total) by mouth 2 (two) times daily. 180 tablet 1   pomalidomide (POMALYST) 2 MG capsule Take 2 mg by mouth daily. Every 21 days.     sucralfate (CARAFATE) 1 g tablet Take 1 tablet (1 g total) by mouth 4 (four) times daily -  with meals and at bedtime. 120 tablet 0   topiramate (TOPAMAX) 50 MG tablet Take 1 tablet (50 mg total) by mouth 2 (two) times daily. 60 tablet 3   amLODipine (NORVASC) 10 MG tablet Take 10 mg by mouth daily.     cetirizine (ZYRTEC) 10 MG tablet Take 1 tablet (10 mg total) by mouth every morning. 30 tablet 1   dexamethasone (DECADRON) 4 MG tablet Take 5 pills at one time with food ONCE a WEEK!! 100 tablet 2   prochlorperazine (COMPAZINE) 10 MG tablet Take 1 tablet (10 mg total) by mouth every 6 (six) hours as needed (Nausea or vomiting). 30 tablet 1   triamterene-hydrochlorothiazide (MAXZIDE-25) 37.5-25 MG tablet  Take 1 tablet by mouth daily. 30 tablet 4   potassium chloride SA (K-DUR) 20 MEQ tablet Take 2 tablets (40 mEq total) by mouth once for 1 dose. 60 tablet 3   No facility-administered medications prior to visit.     Allergies  Allergen Reactions   Benazepril Anaphylaxis and Swelling    angioedema Throat and lip swelling   Ondansetron Hcl Hives    Redness and hives post IV admin on 07/05/17   Codeine Nausea And Vomiting   Morphine Hives    Redness and hives noted post IV admin on 07/05/17    Review of Systems  Constitutional: Positive for malaise/fatigue. Negative for fever.  HENT: Positive for congestion, ear pain and sinus pain. Negative for ear discharge and tinnitus.   Eyes: Negative for blurred vision.  Respiratory: Positive for sputum production.   Cardiovascular: Negative for chest pain, palpitations and leg swelling.  Gastrointestinal: Negative for abdominal pain, blood in stool and nausea.  Genitourinary: Negative for dysuria and frequency.  Musculoskeletal: Negative for falls.  Skin: Negative for rash.  Neurological: Positive for headaches. Negative for dizziness, tingling, focal weakness and loss of consciousness.  Endo/Heme/Allergies: Negative for environmental allergies.  Psychiatric/Behavioral: Negative for depression. The patient is not nervous/anxious.        Objective:    Physical Exam Vitals signs and nursing note reviewed.  Constitutional:      General: She is not in acute distress.    Appearance: She is well-developed.  HENT:     Head: Normocephalic and atraumatic.  Nose: Nose normal.  Eyes:     General:        Right eye: No discharge.        Left eye: No discharge.  Neck:     Musculoskeletal: Normal range of motion and neck supple.  Cardiovascular:     Rate and Rhythm: Normal rate and regular rhythm.     Heart sounds: No murmur.  Pulmonary:     Effort: Pulmonary effort is normal.     Breath sounds: Normal breath sounds.  Abdominal:      General: Bowel sounds are normal.     Palpations: Abdomen is soft.     Tenderness: There is no abdominal tenderness.  Skin:    General: Skin is warm and dry.  Neurological:     Mental Status: She is alert and oriented to person, place, and time.     BP (!) 142/92    Pulse 82    Temp 98.2 F (36.8 C) (Temporal)    Resp 18    Wt 192 lb 12.8 oz (87.5 kg)    SpO2 97%    BMI 31.12 kg/m  Wt Readings from Last 3 Encounters:  09/11/19 192 lb 12.8 oz (87.5 kg)  08/30/19 190 lb (86.2 kg)  08/25/19 188 lb (85.3 kg)    Diabetic Foot Exam - Simple   No data filed     Lab Results  Component Value Date   WBC 2.7 (L) 09/08/2019   HGB 11.6 (L) 09/08/2019   HCT 36.2 09/08/2019   PLT 125 (L) 09/08/2019   GLUCOSE 87 09/08/2019   CHOL 200 07/13/2019   TRIG 146.0 07/13/2019   HDL 66.00 07/13/2019   LDLCALC 104 (H) 07/13/2019   ALT 14 09/08/2019   AST 19 09/08/2019   NA 142 09/08/2019   K 3.5 09/08/2019   CL 107 09/08/2019   CREATININE 0.78 09/08/2019   BUN 22 09/08/2019   CO2 28 09/08/2019   TSH 0.93 07/13/2019   INR 0.90 07/28/2018   HGBA1C 5.5 08/30/2019    Lab Results  Component Value Date   TSH 0.93 07/13/2019   Lab Results  Component Value Date   WBC 2.7 (L) 09/08/2019   HGB 11.6 (L) 09/08/2019   HCT 36.2 09/08/2019   MCV 91.9 09/08/2019   PLT 125 (L) 09/08/2019   Lab Results  Component Value Date   NA 142 09/08/2019   K 3.5 09/08/2019   CHLORIDE 108 12/31/2016   CO2 28 09/08/2019   GLUCOSE 87 09/08/2019   BUN 22 09/08/2019   CREATININE 0.78 09/08/2019   BILITOT 0.3 09/08/2019   ALKPHOS 44 09/08/2019   AST 19 09/08/2019   ALT 14 09/08/2019   PROT 7.9 09/08/2019   ALBUMIN 4.1 09/08/2019   CALCIUM 9.5 09/08/2019   ANIONGAP 7 09/08/2019   EGFR >90 12/31/2016   GFR 76.01 07/13/2019   Lab Results  Component Value Date   CHOL 200 07/13/2019   Lab Results  Component Value Date   HDL 66.00 07/13/2019   Lab Results  Component Value Date   LDLCALC 104  (H) 07/13/2019   Lab Results  Component Value Date   TRIG 146.0 07/13/2019   Lab Results  Component Value Date   CHOLHDL 3 07/13/2019   Lab Results  Component Value Date   HGBA1C 5.5 08/30/2019       Assessment & Plan:   Problem List Items Addressed This Visit    Hypertension    Improved on recheck but running  high consistently. Will continue Amlodipine 10 mg qhs and will add 5 mg qam. Warned to watch for swelling.       Relevant Medications   amLODipine (NORVASC) 5 MG tablet   Sinusitis    She has taken 3 doses of Augmentin over past 2 days and feels that has helped some so will give a 10 day course of Augmentin and then reevaluate      Relevant Medications   loratadine (CLARITIN) 10 MG tablet   amoxicillin-clavulanate (AUGMENTIN) 875-125 MG tablet   promethazine (PHENERGAN) 25 MG tablet   Headache    Is established with Dr Jaynee Eagles and they are working on imaging and EEG to rule out cause other than atypical migraine. Due to her pacemaker they were unable to proceed with MRI as hoped for. She was asked for her pacemaker info and realized after the fact she gave the wrong card info. She has had 2 pacemakers one in 2015 and one in 2018 and she gave radiology the info on the 2015 pacer. She has tried to call back and has ben unable to correct so far. Today we did get her card for the 2018 card and upload it into her chart in demographics and FYI for future reference. They may have to proceed with CT instead. For now her HAs are daily the Bupap helps some but will add Promethazine prn for bad HA. She had to present to ED with bad HA recently and the Benadryl, Compazine combo helped. She feels that a sinus infection and some ETD are making the Headaches worse. She has taken 3 doses of Augmentin over past 2 days and feels that has helped some so will give a 10 day course of Augmentin and then reevaluate      Relevant Medications   amLODipine (NORVASC) 5 MG tablet   ETD (Eustachian  tube dysfunction), bilateral    R>L. Encouraged Flonase Daily, Claritin bid and nasal saline bid followed by valsalva maneuver.          I have discontinued Lannette Donath dexamethasone, prochlorperazine, cetirizine, triamterene-hydrochlorothiazide, and amLODipine. I am also having her start on loratadine, amoxicillin-clavulanate, promethazine, and amLODipine. Additionally, I am having her maintain her conjugated estrogens, acetaminophen, pantoprazole, aspirin EC, ondansetron, lidocaine, sucralfate, famciclovir, hyoscyamine, albuterol, cyclobenzaprine, Multiple Vitamins-Minerals (MULTIVITAMIN ADULT PO), HYDROcodone-acetaminophen, carvedilol, topiramate, famotidine, fluticasone, potassium chloride SA, ACETAMINOPHEN-BUTALBITAL, furosemide, Carboxymethylcellulose Sodium (ARTIFICIAL TEARS OP), pomalidomide, metoCLOPramide, and ALPRAZolam.  Meds ordered this encounter  Medications   loratadine (CLARITIN) 10 MG tablet    Sig: Take 1 tablet (10 mg total) by mouth 2 (two) times daily as needed for allergies.    Dispense:  30 tablet    Refill:  11   amoxicillin-clavulanate (AUGMENTIN) 875-125 MG tablet    Sig: Take 1 tablet by mouth 2 (two) times daily.    Dispense:  20 tablet    Refill:  0   promethazine (PHENERGAN) 25 MG tablet    Sig: Take 1 tablet (25 mg total) by mouth every 8 (eight) hours as needed for nausea or vomiting.    Dispense:  30 tablet    Refill:  2   amLODipine (NORVASC) 5 MG tablet    Sig: 1 tab in am and 2 tabs po pm    Dispense:  90 tablet    Refill:  1     Penni Homans, MD

## 2019-09-13 NOTE — Telephone Encounter (Signed)
Noted, send the order to GI they will reach out to the patient to schedule.

## 2019-09-19 ENCOUNTER — Other Ambulatory Visit: Payer: Self-pay | Admitting: *Deleted

## 2019-09-19 DIAGNOSIS — R519 Headache, unspecified: Secondary | ICD-10-CM | POA: Diagnosis not present

## 2019-09-19 MED ORDER — POMALIDOMIDE 2 MG PO CAPS: 2.0000 mg | ORAL_CAPSULE | Freq: Every day | ORAL | 0 refills | Status: DC

## 2019-09-21 ENCOUNTER — Telehealth: Payer: Self-pay | Admitting: *Deleted

## 2019-09-21 NOTE — Telephone Encounter (Signed)
Received notification that patient Pomalyst Rx was shipped via [facility].  Estimated delivery date 09-20-2019

## 2019-09-23 DIAGNOSIS — J40 Bronchitis, not specified as acute or chronic: Secondary | ICD-10-CM | POA: Diagnosis not present

## 2019-09-24 IMAGING — DX DG SHOULDER 2+V*R*
3 series · 3 of 3 positions shown · non-contrast
Comparison: 05/07/2018 chest x-ray.

CLINICAL DATA: 62-year-old female with right shoulder pain
especially with abduction. Posterior neck pain and lower back pain
for 2 weeks. No known injury. Initial encounter.

EXAM:
RIGHT SHOULDER - 2+ VIEW

[shoulder grashey]
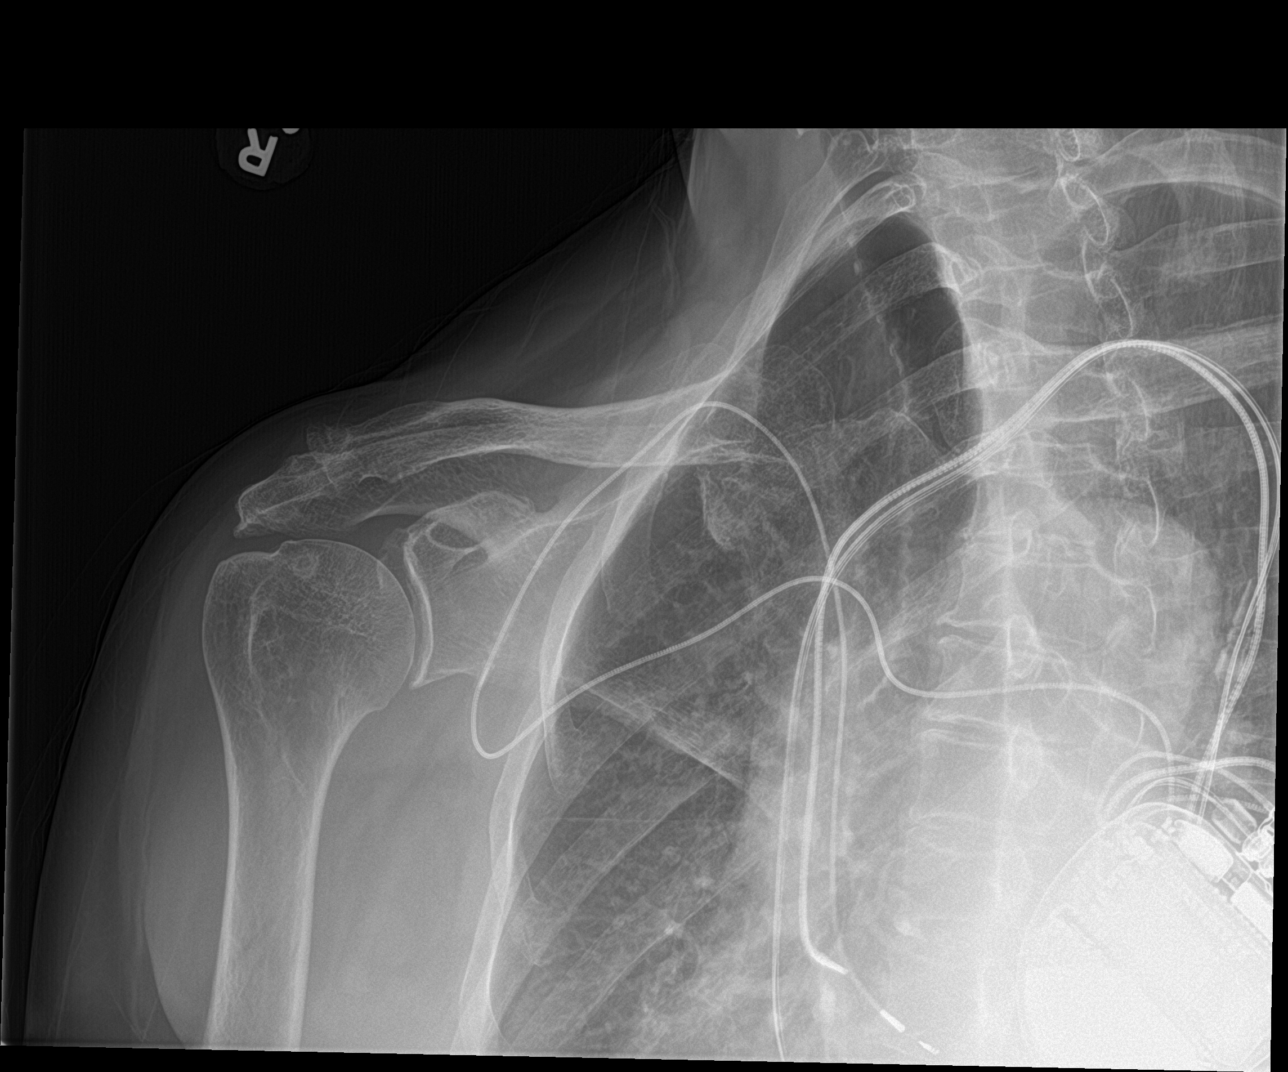

[shoulder y view]
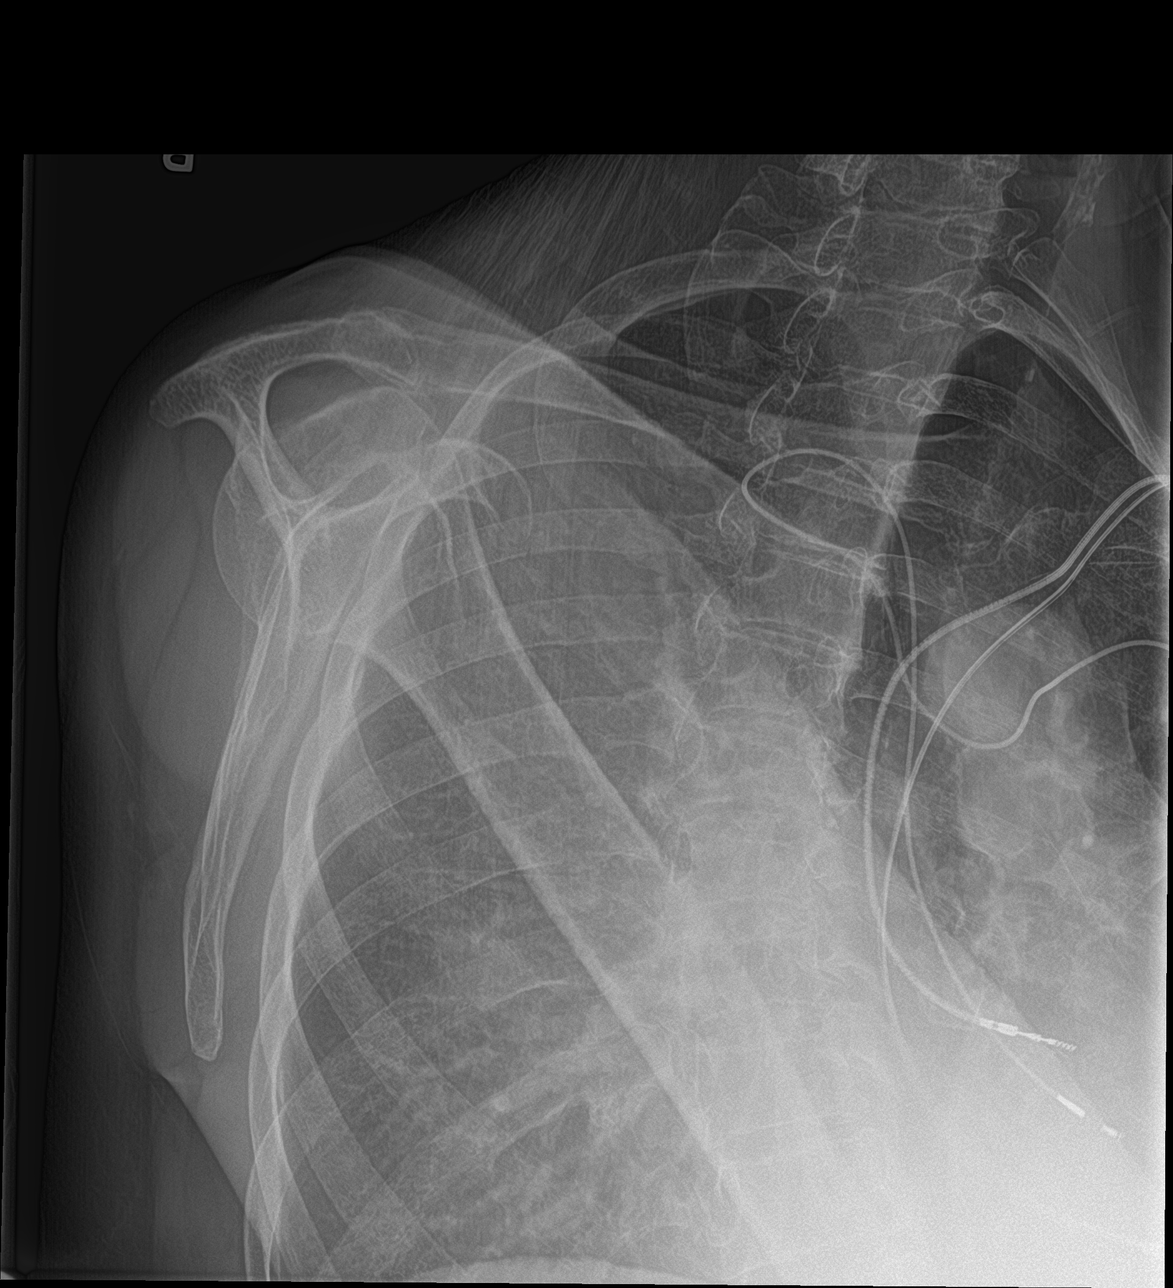

[shoulder axillary]
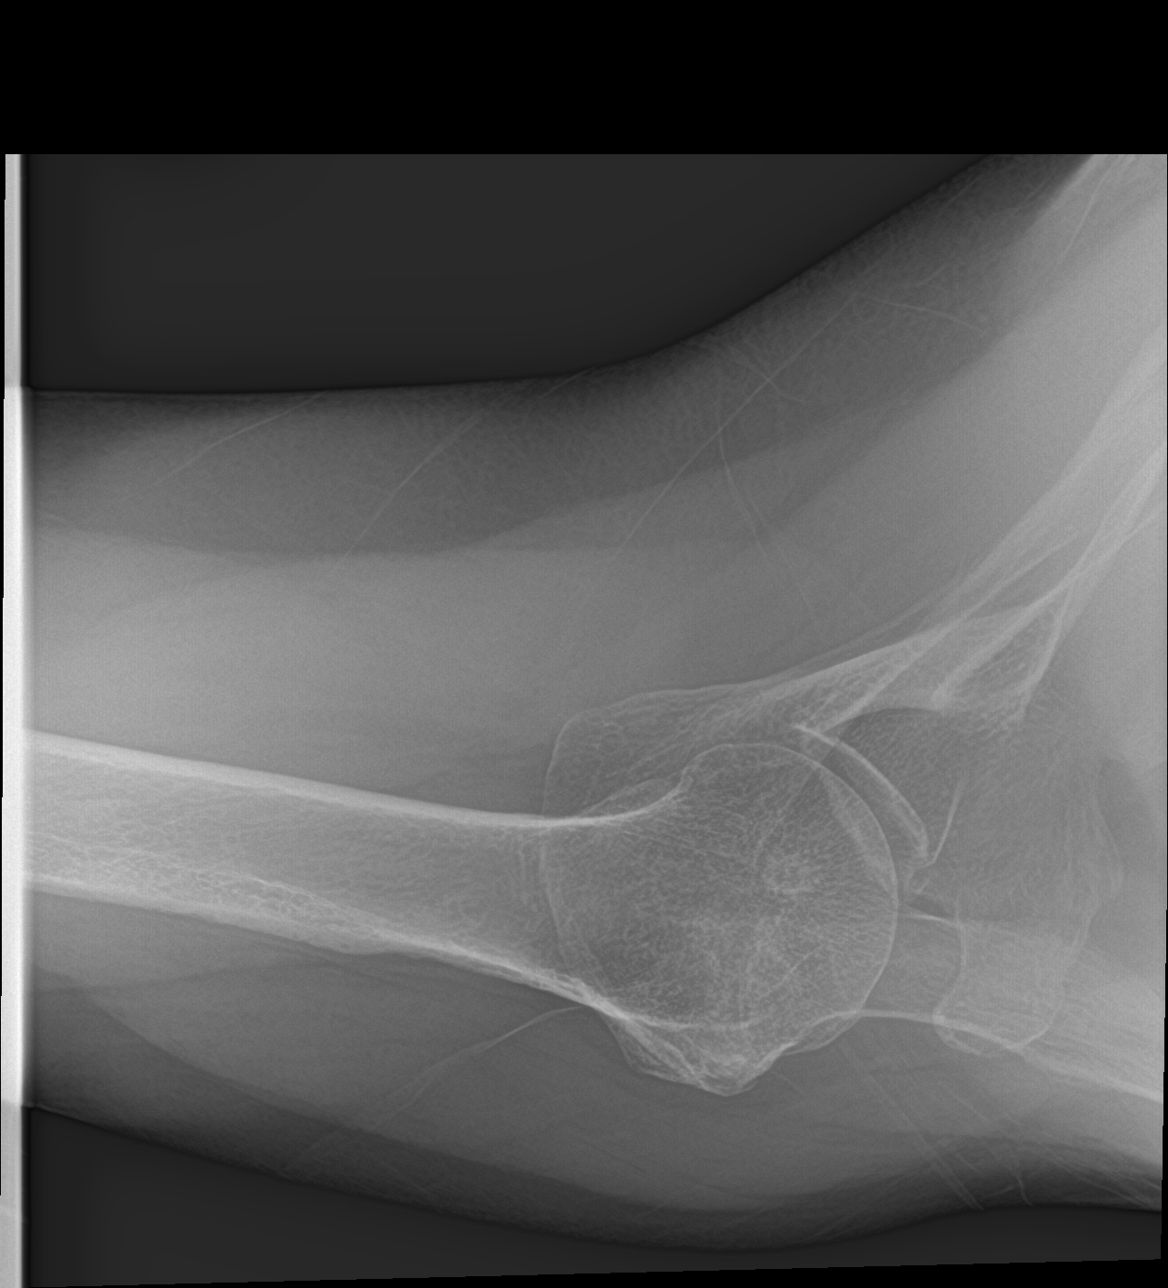

[3 of 3 positions shown; findings below may reference images not displayed]

FINDINGS: Moderate right acromioclavicular joint degenerative changes. Mild
glenohumeral joint degenerative changes. Small subchondral cyst
superior humeral head may be related to result of impingement with
motion.

No abnormal soft tissue calcifications.  No fracture or dislocation.

Pacemaker leads incidentally noted.
IMPRESSION: 1. Moderate acromioclavicular joint degenerative changes.
2. Mild glenohumeral joint degenerative changes.
3. Small subchondral cyst superior humeral head may be related to
result of impingement with motion.

## 2019-09-24 IMAGING — DX DG CERVICAL SPINE COMPLETE 4+V
6 series · 6 of 6 positions shown · non-contrast
Comparison: None.

CLINICAL DATA: 62-year-old female with right shoulder pain
especially with abduction. Posterior neck pain and lower back pain
for 2 weeks. No known injury. Initial encounter.

EXAM:
CERVICAL SPINE - COMPLETE 4+ VIEW

[c-spine lat]
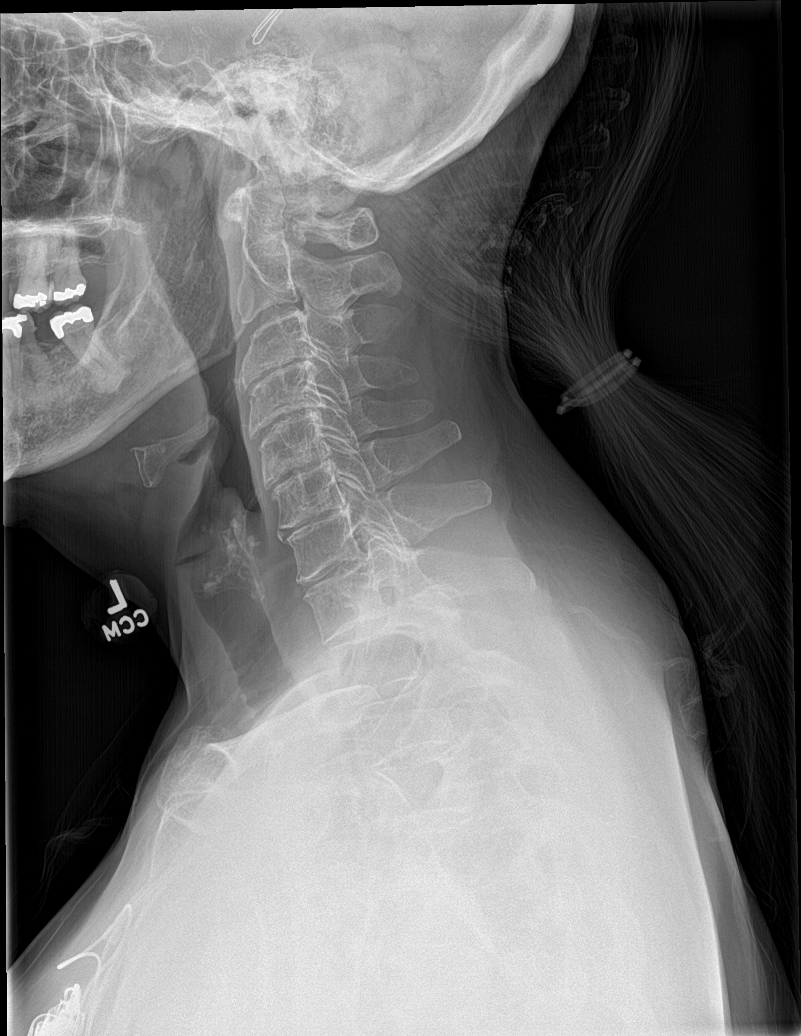

[c-spine obl (1 of 2)]
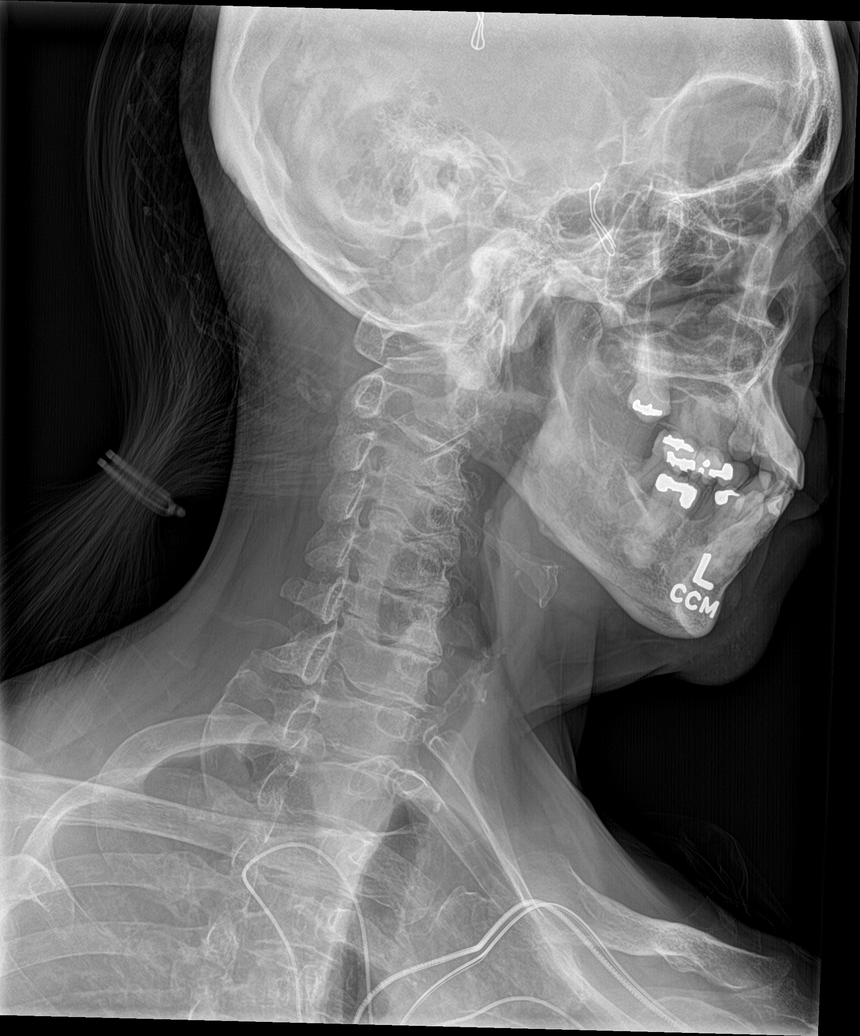

[c-spine obl (2 of 2)]
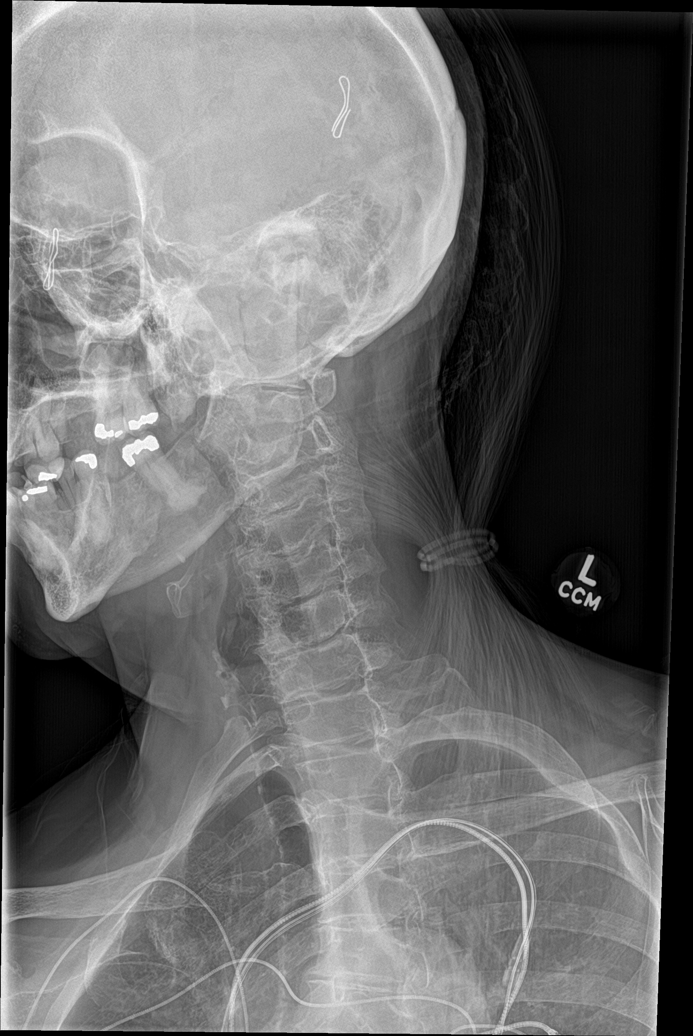

[c-spine ap]
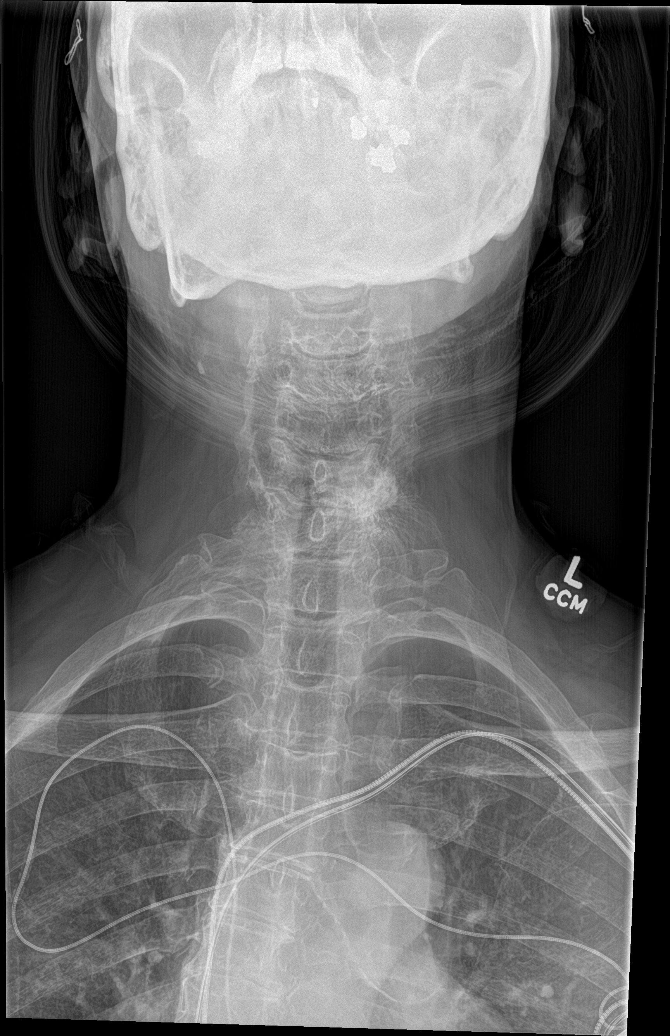

[c-spine open mouth]
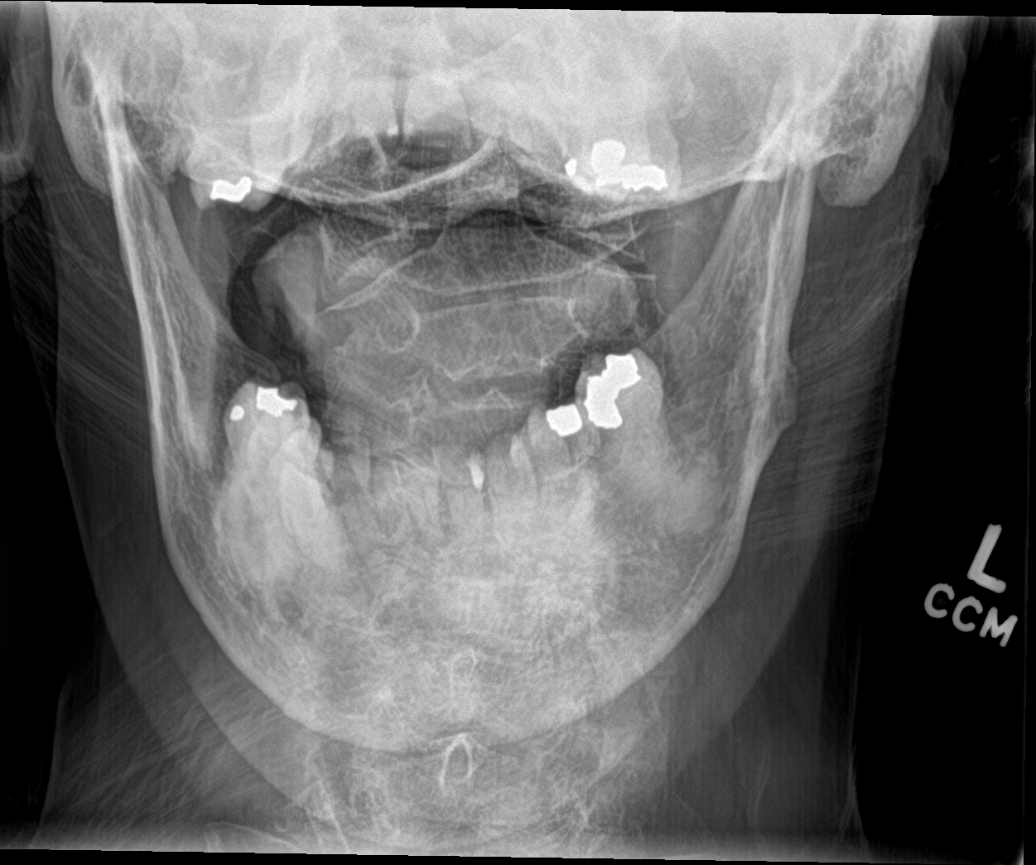

[[person_name]]
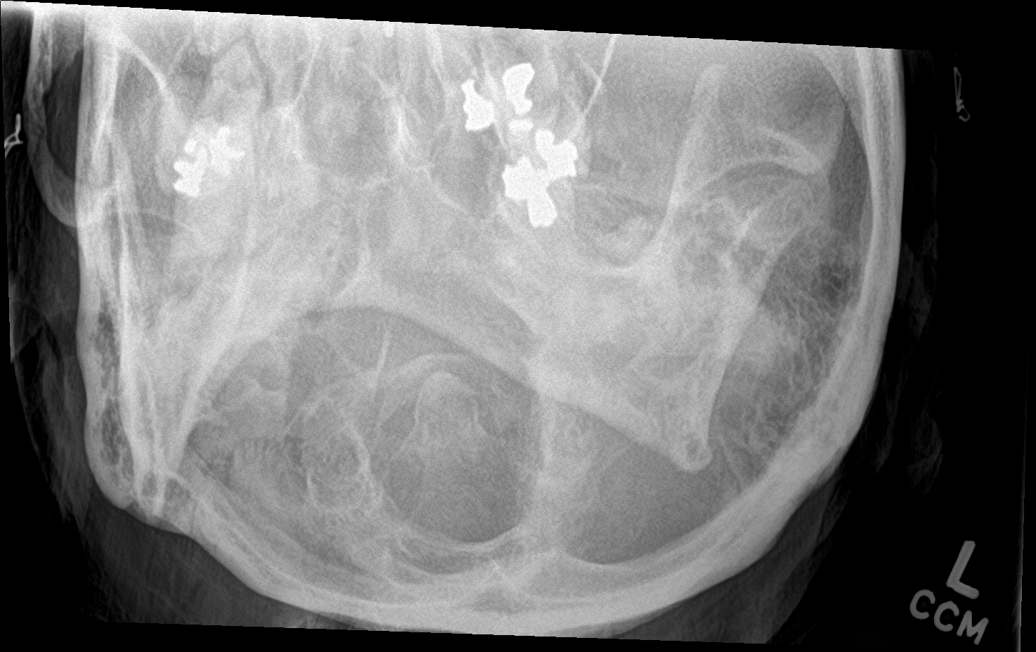

[6 of 6 positions shown; findings below may reference images not displayed]

FINDINGS: Straightening of the cervical spine. No abnormal prevertebral soft
tissue swelling. Cervical spondylotic changes with disc space
narrowing and uncinate hypertrophy C3-4 through C7-T1 with mild
bilateral bony neural foraminal narrowing. No fracture noted.
IMPRESSION: Cervical spondylotic changes C3-4 through C6-7 with disc space
narrowing and mild bilateral bony neural foraminal narrowing.

## 2019-09-25 DIAGNOSIS — I10 Essential (primary) hypertension: Secondary | ICD-10-CM | POA: Diagnosis not present

## 2019-09-25 DIAGNOSIS — I428 Other cardiomyopathies: Secondary | ICD-10-CM | POA: Diagnosis not present

## 2019-09-25 DIAGNOSIS — I447 Left bundle-branch block, unspecified: Secondary | ICD-10-CM | POA: Diagnosis not present

## 2019-09-25 DIAGNOSIS — C9000 Multiple myeloma not having achieved remission: Secondary | ICD-10-CM | POA: Diagnosis not present

## 2019-09-25 DIAGNOSIS — G4733 Obstructive sleep apnea (adult) (pediatric): Secondary | ICD-10-CM | POA: Diagnosis not present

## 2019-09-29 ENCOUNTER — Inpatient Hospital Stay: Payer: Medicare Other

## 2019-09-29 ENCOUNTER — Other Ambulatory Visit: Payer: Self-pay

## 2019-09-29 ENCOUNTER — Inpatient Hospital Stay: Payer: Medicare Other | Attending: Hematology & Oncology | Admitting: Family

## 2019-09-29 ENCOUNTER — Encounter: Payer: Self-pay | Admitting: Family

## 2019-09-29 VITALS — BP 154/81 | HR 83 | Temp 97.1°F | Resp 18 | Wt 191.0 lb

## 2019-09-29 DIAGNOSIS — C9000 Multiple myeloma not having achieved remission: Secondary | ICD-10-CM

## 2019-09-29 DIAGNOSIS — D801 Nonfamilial hypogammaglobulinemia: Secondary | ICD-10-CM | POA: Diagnosis not present

## 2019-09-29 DIAGNOSIS — Z5112 Encounter for antineoplastic immunotherapy: Secondary | ICD-10-CM | POA: Diagnosis not present

## 2019-09-29 DIAGNOSIS — D509 Iron deficiency anemia, unspecified: Secondary | ICD-10-CM

## 2019-09-29 LAB — CBC WITH DIFFERENTIAL (CANCER CENTER ONLY)
Abs Immature Granulocytes: 0.01 10*3/uL (ref 0.00–0.07)
Basophils Absolute: 0 10*3/uL (ref 0.0–0.1)
Basophils Relative: 1 %
Eosinophils Absolute: 0.1 10*3/uL (ref 0.0–0.5)
Eosinophils Relative: 2 %
HCT: 36.6 % (ref 36.0–46.0)
Hemoglobin: 11.8 g/dL — ABNORMAL LOW (ref 12.0–15.0)
Immature Granulocytes: 0 %
Lymphocytes Relative: 41 %
Lymphs Abs: 1.2 10*3/uL (ref 0.7–4.0)
MCH: 29.3 pg (ref 26.0–34.0)
MCHC: 32.2 g/dL (ref 30.0–36.0)
MCV: 90.8 fL (ref 80.0–100.0)
Monocytes Absolute: 0.3 10*3/uL (ref 0.1–1.0)
Monocytes Relative: 9 %
Neutro Abs: 1.3 10*3/uL — ABNORMAL LOW (ref 1.7–7.7)
Neutrophils Relative %: 47 %
Platelet Count: 145 10*3/uL — ABNORMAL LOW (ref 150–400)
RBC: 4.03 MIL/uL (ref 3.87–5.11)
RDW: 13.3 % (ref 11.5–15.5)
WBC Count: 2.8 10*3/uL — ABNORMAL LOW (ref 4.0–10.5)
nRBC: 0 % (ref 0.0–0.2)

## 2019-09-29 LAB — CMP (CANCER CENTER ONLY)
ALT: 12 U/L (ref 0–44)
AST: 20 U/L (ref 15–41)
Albumin: 4.1 g/dL (ref 3.5–5.0)
Alkaline Phosphatase: 43 U/L (ref 38–126)
Anion gap: 7 (ref 5–15)
BUN: 18 mg/dL (ref 8–23)
CO2: 26 mmol/L (ref 22–32)
Calcium: 9.2 mg/dL (ref 8.9–10.3)
Chloride: 106 mmol/L (ref 98–111)
Creatinine: 0.73 mg/dL (ref 0.44–1.00)
GFR, Est AFR Am: >60 mL/min (ref >60)
GFR, Estimated: >60 mL/min (ref >60)
Glucose, Bld: 105 mg/dL — ABNORMAL HIGH (ref 70–99)
Potassium: 3.5 mmol/L (ref 3.5–5.1)
Sodium: 139 mmol/L (ref 135–145)
Total Bilirubin: 0.3 mg/dL (ref 0.3–1.2)
Total Protein: 8.7 g/dL — ABNORMAL HIGH (ref 6.5–8.1)

## 2019-09-29 MED ORDER — PROCHLORPERAZINE MALEATE 10 MG PO TABS: 10.0000 mg | ORAL_TABLET | Freq: Once | ORAL | Status: DC

## 2019-09-29 MED ORDER — BORTEZOMIB CHEMO SQ INJECTION 3.5 MG (2.5MG/ML)
1.3000 mg/m2 | Freq: Once | INTRAMUSCULAR | Status: AC
  Administered 2019-09-29: 2.5 mg via SUBCUTANEOUS
  Filled 2019-09-29: qty 1

## 2019-09-29 NOTE — Progress Notes (Signed)
Ok to treat with anc of 1.3 per Dr Marin Olp. No treatment next week. Resumed tx on 12/4. dph

## 2019-09-29 NOTE — Patient Instructions (Signed)
Bortezomib injection What is this medicine? BORTEZOMIB (bor TEZ oh mib) is a medicine that targets proteins in cancer cells and stops the cancer cells from growing. It is used to treat multiple myeloma and mantle-cell lymphoma. This medicine may be used for other purposes; ask your health care provider or pharmacist if you have questions. COMMON BRAND NAME(S): Velcade What should I tell my health care provider before I take this medicine? They need to know if you have any of these conditions:  diabetes  heart disease  irregular heartbeat  liver disease  on hemodialysis  low blood counts, like low white blood cells, platelets, or hemoglobin  peripheral neuropathy  taking medicine for blood pressure  an unusual or allergic reaction to bortezomib, mannitol, boron, other medicines, foods, dyes, or preservatives  pregnant or trying to get pregnant  breast-feeding How should I use this medicine? This medicine is for injection into a vein or for injection under the skin. It is given by a health care professional in a hospital or clinic setting. Talk to your pediatrician regarding the use of this medicine in children. Special care may be needed. Overdosage: If you think you have taken too much of this medicine contact a poison control center or emergency room at once. NOTE: This medicine is only for you. Do not share this medicine with others. What if I miss a dose? It is important not to miss your dose. Call your doctor or health care professional if you are unable to keep an appointment. What may interact with this medicine? This medicine may interact with the following medications:  ketoconazole  rifampin  ritonavir  St. John's Wort This list may not describe all possible interactions. Give your health care provider a list of all the medicines, herbs, non-prescription drugs, or dietary supplements you use. Also tell them if you smoke, drink alcohol, or use illegal drugs. Some  items may interact with your medicine. What should I watch for while using this medicine? You may get drowsy or dizzy. Do not drive, use machinery, or do anything that needs mental alertness until you know how this medicine affects you. Do not stand or sit up quickly, especially if you are an older patient. This reduces the risk of dizzy or fainting spells. In some cases, you may be given additional medicines to help with side effects. Follow all directions for their use. Call your doctor or health care professional for advice if you get a fever, chills or sore throat, or other symptoms of a cold or flu. Do not treat yourself. This drug decreases your body's ability to fight infections. Try to avoid being around people who are sick. This medicine may increase your risk to bruise or bleed. Call your doctor or health care professional if you notice any unusual bleeding. You may need blood work done while you are taking this medicine. In some patients, this medicine may cause a serious brain infection that may cause death. If you have any problems seeing, thinking, speaking, walking, or standing, tell your doctor right away. If you cannot reach your doctor, urgently seek other source of medical care. Check with your doctor or health care professional if you get an attack of severe diarrhea, nausea and vomiting, or if you sweat a lot. The loss of too much body fluid can make it dangerous for you to take this medicine. Do not become pregnant while taking this medicine or for at least 7 months after stopping it. Women should inform their doctor   if they wish to become pregnant or think they might be pregnant. Men should not father a child while taking this medicine and for at least 4 months after stopping it. There is a potential for serious side effects to an unborn child. Talk to your health care professional or pharmacist for more information. Do not breast-feed an infant while taking this medicine or for 2  months after stopping it. This medicine may interfere with the ability to have a child. You should talk with your doctor or health care professional if you are concerned about your fertility. What side effects may I notice from receiving this medicine? Side effects that you should report to your doctor or health care professional as soon as possible:  allergic reactions like skin rash, itching or hives, swelling of the face, lips, or tongue  breathing problems  changes in hearing  changes in vision  fast, irregular heartbeat  feeling faint or lightheaded, falls  pain, tingling, numbness in the hands or feet  right upper belly pain  seizures  swelling of the ankles, feet, hands  unusual bleeding or bruising  unusually weak or tired  vomiting  yellowing of the eyes or skin Side effects that usually do not require medical attention (report to your doctor or health care professional if they continue or are bothersome):  changes in emotions or moods  constipation  diarrhea  loss of appetite  headache  irritation at site where injected  nausea This list may not describe all possible side effects. Call your doctor for medical advice about side effects. You may report side effects to FDA at 1-800-FDA-1088. Where should I keep my medicine? This drug is given in a hospital or clinic and will not be stored at home. NOTE: This sheet is a summary. It may not cover all possible information. If you have questions about this medicine, talk to your doctor, pharmacist, or health care provider.  2020 [employer] (2018-03-07 16:29:31)  

## 2019-09-29 NOTE — Progress Notes (Signed)
Hematology and Oncology Follow Up Visit  Tami [name] [id number] 1956/05/16 63 y.o. 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 She is doing fairly well but has been having palpitations and Dr. 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 He added Losartan 25 mg PO daily to her regimen. She states that there was concern about whether or not she was ok to have surgery due to ongoing treatment for Myeloma.  No falls or syncope.  No fever, chills, n/v cough, rash, dizziness, SOB, chest pain, abdominal pain or changes in bowel or bladder habits.  No swelling or tenderness in her extremities.  She has numbness and tingling in her feet off and on.  She has lower back pain at times.  She has occasional headaches and states that she has been referred to neurology and has her second visit next week on Monday.  She is eating well and staying hydrated. Her weight is stable.   ECOG Performance Status: 1 - Symptomatic but completely ambulatory  Medications:  Allergies as of 09/29/2019      Reactions   Benazepril Anaphylaxis, Swelling   angioedema Throat and lip swelling   Ondansetron Hcl Hives   Redness and hives post IV admin on 07/05/17   Codeine Nausea And Vomiting   Morphine Hives   Redness and hives noted post IV admin on 07/05/17      Medication List       Accurate as of September 29, 2019  2:23 PM. If you have any questions, ask your nurse or doctor.        acetaminophen 325 MG tablet Commonly known as: TYLENOL Take 650 mg by mouth.   ACETAMINOPHEN-BUTALBITAL 50-325 MG Tabs Take 1 tablet by mouth 2 (two) times daily as needed.   albuterol 108 (90 Base) MCG/ACT inhaler  Commonly known as: VENTOLIN HFA Inhale 2 puffs into the lungs every 6 (six) hours as needed for wheezing or shortness of breath.   ALPRAZolam 0.25 MG tablet Commonly known as: Xanax Take 1-2 tabs (0.25mg -0.50mg ) 30-60 minutes before procedure. May repeat if needed.Do not drive.   amLODipine 5 MG tablet Commonly known as: NORVASC 1 tab in am and 2 tabs po pm   amoxicillin-clavulanate 875-125 MG tablet Commonly known as: AUGMENTIN Take 1 tablet by mouth 2 (two) times daily.   ARTIFICIAL TEARS OP Apply to eye.   aspirin EC 81 MG tablet Take 1 tablet (81 mg total) by mouth daily.   carvedilol 25 MG tablet Commonly known as: COREG Take 1 tablet (25 mg total) by mouth 2 (two) times daily with a meal.   cyclobenzaprine 10 MG tablet Commonly known as: FLEXERIL Take 10 mg by mouth as needed.   famciclovir 250 MG tablet Commonly known as: FAMVIR Take 1 tablet (250 mg total) by mouth daily.   famotidine 40 MG/5ML suspension Commonly known as: PEPCID Take 5 mLs (40 mg total) by mouth at bedtime as needed for heartburn or indigestion.   fluticasone 50 MCG/ACT nasal spray Commonly known as: FLONASE Place 2 sprays into both nostrils daily. What changed:   when to take this  reasons to take this   furosemide 20 MG tablet Commonly known as: LASIX Take  1 tablet (20 mg total) by mouth daily as needed for fluid or edema.   HYDROcodone-acetaminophen 5-325 MG tablet Commonly known as: NORCO/VICODIN Take 1 tablet by mouth every 4 (four) hours as needed for moderate pain or severe pain.   hyoscyamine 0.125 MG SL tablet Commonly known as: LEVSIN SL Place 1 tablet (0.125 mg total) under the tongue every 4 (four) hours as needed.   lidocaine 5 % Commonly known as: Lidoderm Place 1 patch onto the skin daily. Remove & Discard patch within 12 hours or as directed by MD   loratadine 10 MG tablet Commonly known as: CLARITIN Take 1 tablet (10 mg total) by mouth 2 (two) times daily as  needed for allergies.   losartan 25 MG tablet Commonly known as: COZAAR Take by mouth.   metoCLOPramide 10 MG tablet Commonly known as: Reglan Take 1 tablet (10 mg total) by mouth 4 (four) times daily. For migraine or headache or nausea   MULTIVITAMIN ADULT PO Take by mouth.   ondansetron 4 MG tablet Commonly known as: Zofran Take 1 tablet (4 mg total) by mouth every 8 (eight) hours as needed for nausea or vomiting.   pantoprazole 40 MG tablet Commonly known as: PROTONIX Take 1 tablet (40 mg total) by mouth 2 (two) times daily.   pomalidomide 2 MG capsule Commonly known as: POMALYST Take 1 capsule (2 mg total) by mouth daily. Every 21 days. Auth [id number]   potassium chloride SA 20 MEQ tablet Commonly known as: KLOR-CON Take 2 tablets (40 mEq total) by mouth once for 1 dose.   Premarin vaginal cream Generic drug: conjugated estrogens Place vaginally.   promethazine 25 MG tablet Commonly known as: PHENERGAN Take 1 tablet (25 mg total) by mouth every 8 (eight) hours as needed for nausea or vomiting.   rizatriptan 10 MG tablet Commonly known as: MAXALT SMARTSIG:1 Tablet(s) By Mouth As Needed   sucralfate 1 g tablet Commonly known as: Carafate Take 1 tablet (1 g total) by mouth 4 (four) times daily -  with meals and at bedtime.   SUMAtriptan 100 MG tablet Commonly known as: IMITREX 1 tab PO at headache onset, may repeat in 2 hrs as needed. No more than 2 tabs per day or 4 tabs per wk. (Can dispense #9 if quality limit)   topiramate 50 MG tablet Commonly known as: Topamax Take 1 tablet (50 mg total) by mouth 2 (two) times daily.   triamterene-hydrochlorothiazide 37.5-25 MG tablet Commonly known as: MAXZIDE-25 Take 1 tablet by mouth daily.       Allergies:  Allergies  Allergen Reactions  . Benazepril Anaphylaxis and Swelling    angioedema Throat and lip swelling  . Ondansetron Hcl Hives    Redness and hives post IV admin on 07/05/17  . Codeine Nausea And  Vomiting  . Morphine Hives    Redness and hives noted post IV admin on 07/05/17    Past Medical History, Surgical history, Social history, and Family History were reviewed and updated.  Review of Systems: All other 10 point review of systems is negative.   Physical Exam:  weight is 191 lb (86.6 kg). Her temporal temperature is 97.1 F (36.2 C) (abnormal). Her blood pressure is 154/81 (abnormal) and her pulse is 83. Her respiration is 18 and oxygen saturation is 99%.   Wt Readings from Last 3 Encounters:  09/29/19 191 lb (86.6 kg)  09/11/19 192 lb 12.8 oz (87.5 kg)  08/30/19 190 lb (86.2 kg)    Ocular:  Sclerae unicteric, pupils equal, round and reactive to light Ear-nose-throat: Oropharynx clear, dentition fair Lymphatic: No cervical or supraclavicular adenopathy Lungs no rales or rhonchi, good excursion bilaterally Heart regular rate and rhythm, no murmur appreciated Abd soft, nontender, positive bowel sounds, no liver or spleen tip palpated on exam, no fluid wave  MSK no focal spinal tenderness, no joint edema Neuro: non-focal, well-oriented, appropriate affect Breasts: Deferred   Lab Results  Component Value Date   WBC 2.8 (L) 09/29/2019   HGB 11.8 (L) 09/29/2019   HCT 36.6 09/29/2019   MCV 90.8 09/29/2019   PLT 145 (L) 09/29/2019   Lab Results  Component Value Date   FERRITIN 169 09/16/2016   IRON 59 09/16/2016   TIBC 281 09/16/2016   UIBC 222 09/16/2016   IRONPCTSAT 21 09/16/2016   Lab Results  Component Value Date   RETICCTPCT 0.8 07/16/2016   RBC 4.03 09/29/2019   RETICCTABS 32,640 07/16/2016   Lab Results  Component Value Date   KPAFRELGTCHN 58.1 (H) 08/25/2019   LAMBDASER 9.9 08/25/2019   KAPLAMBRATIO 5.87 (H) 08/25/2019   Lab Results  Component Value Date   IGGSERUM 2,512 (H) 08/25/2019   IGA 204 08/25/2019   IGMSERUM 97 08/25/2019   Lab Results  Component Value Date   TOTALPROTELP 7.6 08/25/2019   ALBUMINELP 3.7 08/25/2019   A1GS 0.2  08/25/2019   A2GS 0.9 08/25/2019   BETS 1.0 08/25/2019   BETA2SER 0.5 07/16/2016   GAMS 1.9 (H) 08/25/2019   MSPIKE 1.0 (H) 08/25/2019   SPEI Comment 07/21/2019     Chemistry      Component Value Date/Time   NA 139 09/29/2019 1328   NA 143 11/08/2017 1127   NA 140 12/31/2016 1000   K 3.5 09/29/2019 1328   K 4.0 11/08/2017 1127   K 3.6 12/31/2016 1000   CL 106 09/29/2019 1328   CL 107 11/08/2017 1127   CO2 26 09/29/2019 1328   CO2 25 11/08/2017 1127   CO2 24 12/31/2016 1000   BUN 18 09/29/2019 1328   BUN 19 11/08/2017 1127   BUN 22.3 12/31/2016 1000   CREATININE 0.73 09/29/2019 1328   CREATININE 0.9 11/08/2017 1127   CREATININE 0.8 12/31/2016 1000      Component Value Date/Time   CALCIUM 9.2 09/29/2019 1328   CALCIUM 9.9 11/08/2017 1127   CALCIUM 9.5 12/31/2016 1000   ALKPHOS 43 09/29/2019 1328   ALKPHOS 44 11/08/2017 1127   ALKPHOS 51 12/31/2016 1000   AST 20 09/29/2019 1328   AST 28 12/31/2016 1000   ALT 12 09/29/2019 1328   ALT 36 11/08/2017 1127   ALT 16 12/31/2016 1000   BILITOT 0.3 09/29/2019 1328   BILITOT 0.46 12/31/2016 1000       Impression and Plan: Tami [name] is a very pleasant 63 yo African American female with a previous IgG kappa MGUS with progression to myeloma.  I spoke with Dr. Marin Olp regarding her having a surgical procedure to fix the issues she is having with her PM/Defibrillator. He states that from our standpoint she is able to have the procedure if needed.  We will proceed with treatment today as planned. We will do next weeks treatment a week later due to the Thanksgiving holiday.  We will plan to see her back in another 5 weeks.  She will contact our office with any questions or concerns. We can certainly see her sooner if needed.   Laverna Peace, NP 11/20/20202:23 PM

## 2019-09-30 LAB — IGG, IGA, IGM
IgA: 204 mg/dL (ref 87–352)
IgG (Immunoglobin G), Serum: 2466 mg/dL — ABNORMAL HIGH (ref 586–1602)
IgM (Immunoglobulin M), Srm: 103 mg/dL (ref 26–217)

## 2019-10-02 ENCOUNTER — Ambulatory Visit
Admission: RE | Admit: 2019-10-02 | Discharge: 2019-10-02 | Disposition: A | Discharge status: Home / Self Care | Payer: Medicare Other | Source: Ambulatory Visit | Attending: Neurology | Admitting: Neurology

## 2019-10-02 ENCOUNTER — Other Ambulatory Visit: Payer: Self-pay

## 2019-10-02 DIAGNOSIS — R519 Headache, unspecified: Secondary | ICD-10-CM

## 2019-10-02 DIAGNOSIS — H539 Unspecified visual disturbance: Secondary | ICD-10-CM

## 2019-10-02 DIAGNOSIS — C9000 Multiple myeloma not having achieved remission: Secondary | ICD-10-CM

## 2019-10-02 DIAGNOSIS — G441 Vascular headache, not elsewhere classified: Secondary | ICD-10-CM

## 2019-10-02 DIAGNOSIS — I671 Cerebral aneurysm, nonruptured: Secondary | ICD-10-CM

## 2019-10-02 LAB — KAPPA/LAMBDA LIGHT CHAINS
Kappa free light chain: 46.3 mg/L — ABNORMAL HIGH (ref 3.3–19.4)
Kappa, lambda light chain ratio: 4.93 — ABNORMAL HIGH (ref 0.26–1.65)
Lambda free light chains: 9.4 mg/L (ref 5.7–26.3)

## 2019-10-02 MED ORDER — IOPAMIDOL (ISOVUE-370) INJECTION 76%
75.0000 mL | Freq: Once | INTRAVENOUS | Status: AC | PRN
  Administered 2019-10-02: 75 mL via INTRAVENOUS

## 2019-10-03 DIAGNOSIS — G43809 Other migraine, not intractable, without status migrainosus: Secondary | ICD-10-CM | POA: Diagnosis not present

## 2019-10-03 DIAGNOSIS — Z1231 Encounter for screening mammogram for malignant neoplasm of breast: Secondary | ICD-10-CM | POA: Diagnosis not present

## 2019-10-04 ENCOUNTER — Telehealth: Payer: Self-pay | Admitting: *Deleted

## 2019-10-04 NOTE — Telephone Encounter (Signed)
-----   Message from Star Age, MD sent at 10/04/2019  8:11 AM EST ----- CT angio Head w/wo contrast showed no abnormality, in particular, no evidence of aneurysm or stenosis, as in significant hardening of the arteries.  Please update patient.  Tami [name]

## 2019-10-04 NOTE — Telephone Encounter (Signed)
I called the patient and LVM (ok per DPR) advising her CT angio head did not show any abnormalities, in particular, no evidence of aneurysm or stenosis, as in significant hardening of the arteries. I asked the pt to call back if she has any questions. Otherwise we will see for the follow-up with Dr. Jaynee Eagles on 10/17/2019 at 2 pm, arrival 15 minutes prior. Left office number and hours (closed for thanksgiving holiday) in the message.

## 2019-10-04 NOTE — Progress Notes (Signed)
CT angio Head w/wo contrast showed no abnormality, in particular, no evidence of aneurysm or stenosis, as in significant hardening of the arteries.  Please update patient.  Tami [name]

## 2019-10-04 NOTE — Telephone Encounter (Signed)
The pt returned my call. I advised her of the message below regarding results. She verbalized understanding and appreciation. Her questions were answered. She is still having the headaches. She will discuss this with Dr. Jaynee Eagles at her f/u on 12/8. She stated she saw her PCP yesterday and they started her on a once monthly sq injection for the headaches. She is not sure what it was, but said she would bring it to the appt with Dr. Jaynee Eagles.

## 2019-10-09 DIAGNOSIS — G4733 Obstructive sleep apnea (adult) (pediatric): Secondary | ICD-10-CM | POA: Diagnosis not present

## 2019-10-09 LAB — PROTEIN ELECTROPHORESIS, SERUM, WITH REFLEX
A/G Ratio: 0.7 (ref 0.7–1.7)
Albumin ELP: 3.4 g/dL (ref 2.9–4.4)
Alpha-1-Globulin: 0.3 g/dL (ref 0.0–0.4)
Alpha-2-Globulin: 0.9 g/dL (ref 0.4–1.0)
Beta Globulin: 1.2 g/dL (ref 0.7–1.3)
Gamma Globulin: 2.2 g/dL — ABNORMAL HIGH (ref 0.4–1.8)
Globulin, Total: 4.6 g/dL — ABNORMAL HIGH (ref 2.2–3.9)
M-Spike, %: 1.3 g/dL — ABNORMAL HIGH
SPEP Interpretation: 0
Total Protein ELP: 8 g/dL (ref 6.0–8.5)

## 2019-10-09 LAB — IMMUNOFIXATION REFLEX, SERUM
IgA: 217 mg/dL (ref 87–352)
IgG (Immunoglobin G), Serum: 2804 mg/dL — ABNORMAL HIGH (ref 586–1602)
IgM (Immunoglobulin M), Srm: 107 mg/dL (ref 26–217)

## 2019-10-10 ENCOUNTER — Telehealth: Payer: Self-pay | Admitting: Hematology & Oncology

## 2019-10-10 NOTE — Telephone Encounter (Signed)
Appointments scheduled letter/calendar mailed per 11/20 los

## 2019-10-13 ENCOUNTER — Other Ambulatory Visit: Payer: Medicare Other

## 2019-10-13 ENCOUNTER — Inpatient Hospital Stay: Payer: Medicare Other

## 2019-10-13 ENCOUNTER — Ambulatory Visit: Payer: Medicare Other | Admitting: Family

## 2019-10-17 ENCOUNTER — Other Ambulatory Visit: Payer: Self-pay

## 2019-10-17 ENCOUNTER — Ambulatory Visit (INDEPENDENT_AMBULATORY_CARE_PROVIDER_SITE_OTHER): Payer: Medicare Other | Admitting: Neurology

## 2019-10-17 ENCOUNTER — Encounter: Payer: Self-pay | Admitting: Neurology

## 2019-10-17 ENCOUNTER — Other Ambulatory Visit: Payer: Self-pay | Admitting: *Deleted

## 2019-10-17 VITALS — BP 130/79 | HR 77 | Temp 96.9°F | Ht 66.0 in | Wt 198.0 lb

## 2019-10-17 DIAGNOSIS — G43711 Chronic migraine without aura, intractable, with status migrainosus: Secondary | ICD-10-CM | POA: Diagnosis not present

## 2019-10-17 MED ORDER — EMGALITY 120 MG/ML ~~LOC~~ SOAJ: 120.0000 mg | SUBCUTANEOUS | 11 refills | Status: DC

## 2019-10-17 MED ORDER — NURTEC 75 MG PO TBDP: 75.0000 mg | ORAL_TABLET | Freq: Every day | ORAL | 6 refills | Status: DC | PRN

## 2019-10-17 MED ORDER — POMALIDOMIDE 2 MG PO CAPS: 2.0000 mg | ORAL_CAPSULE | Freq: Every day | ORAL | 0 refills | Status: DC

## 2019-10-17 NOTE — Progress Notes (Signed)
[facility]    Provider:  Dr Ahern Requesting Provider: Blyth, Stacey A, MD Primary Care Provider:  Blyth, Stacey A, MD  CC:  Headaches  Interval history 10/17/2019: CTA was negative for aneurysm. CT of the head was normal. She continues to have headaches and she has tried and failed multiple medications over the years. She has 8 migraine days a month and almost daily headaches.   Medications tried that can be used for migraine prevention/acute: Reglan, losartan, zofran, phenergan, topamax, imitrex, maxalt, norvasc, fioricet, carvedilol, flexeril, compazine, maxide, verapamil, tizanidine.   HPI:  Tami [name] is a 63 y.o. female here as requested by Blyth, Stacey A, MD for headaches. Started a few years ago. Here with her cousin who also provides information. Her son has migraines. She has a hx of head trauma she slipped on some wax and the back of her head was hit, been over 30 years, she has been having headaches since then, slowly progressive over the years really bad over the last 2 years. She is having vision changes, blurry vision, double vision, vertigo, difficulty walking, Starts in the back of the head, pounding, pulsating, she has nausea, sound sensitivity, sleeping helps, tylenol doesn't help, also hydrocodone but not often no medication overuse, sometimes she can see stars but more often not. The migraines are severe. She has OSA but uses the machine. 1-2 migraine days a month. They can last 24 hours or longer. No other focal neurologic deficits, associated symptoms, inciting events or modifiable factors.  Reviewed notes, labs and imaging from outside physicians, which showed:  MRA of the head 2006: reviewed report 1.  Approximately 5 mm outpouching in the left MCA bifurcation region seen on oblique lateral views.  This probably represents a vascular loop with an aneurysm though possible felt to be less likely.   2.  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 Pertinent negatives and positives per HPI. All others negative.   Social History   Socioeconomic History  . Marital status: Divorced    Spouse name: [name]  . Number of children: 3  . Years of education: [name]  . Highest education level: High school graduate  Occupational History  . [name]  Social Needs  . Financial resource strain: [name]  . Food insecurity    Worry: [name]    Inability: [name]  . Transportation needs    Medical: [name]    Non-medical: [name]  Tobacco Use  . Smoking status: Never Smoker  . Smokeless tobacco: Never Used  Substance and Sexual Activity  . Alcohol use: No  . Drug use: No  . Sexual activity: [name]  Lifestyle  . Physical activity    Days per week: [name]    Minutes per session: [name]  . Stress: [name]  Relationships  . Social connections    Talks on phone: [name]    Gets together: [name]    Attends religious service: [name]    Active member of club or organization: [name]    Attends meetings of clubs or organizations: [name]    Relationship status: [name]  . Intimate partner violence    Fear of current or ex partner: Not on   file    Emotionally abused: [name]    Physically abused: [name]    Forced sexual activity: [name]  Other Topics Concern  . [name]  Social History Narrative   Lives at home alone   Retired   Caffeine: coffee    Family History  Problem Relation Age of Onset  . Diabetes Mother   . Hypertension Mother   . Heart disease Mother        s/p 1 stent  . Hyperlipidemia Mother   . Arthritis Mother   . Cancer Father        COLON  . Colon cancer Father 76  . Irritable bowel syndrome Sister   . Hyperlipidemia Daughter   . Hypertension Daughter   . Hypertension  Maternal Grandmother   . Arthritis Maternal Grandmother   . Heart disease Maternal Grandfather        MI  . Hypertension Maternal Grandfather   . Arthritis Maternal Grandfather   . Hypertension Son   . Esophageal cancer Neg Hx     Past Medical History:  Diagnosis Date  . Abnormal SPEP 07/26/2016  . Arthritis    knees, hands  . Back pain 07/14/2016  . Bowel obstruction (HCC) 11/2014  . C. difficile diarrhea   . CHF (congestive heart failure) (HCC)   . Colitis   . Congestive heart failure (HCC) 02/24/2015   S/p pacemaker  . Depression   . Elevated sed rate 08/15/2017  . Epigastric pain 03/22/2017  . GERD (gastroesophageal reflux disease)   . Heart disease   . Hyperlipidemia   . Hypertension   . 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 Low back pain 07/14/2016  . 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 Multiple myeloma (HCC) 07/20/2018  . Multiple myeloma not having achieved remission (HCC) 07/20/2018  . Myalgia 12/31/2016  . Obstructive sleep apnea 03/31/2015  . SOB (shortness of breath) 04/09/2016  . Stroke (HCC)    TIAs  . UTI (urinary tract infection)     Patient Active Problem List   Diagnosis Date Noted  . ETD (Eustachian tube dysfunction), bilateral 09/12/2019  . Urinary frequency 07/17/2019  . Headache 07/17/2019  . Palpitation 05/15/2019  . Urticaria 02/22/2019  . Conjunctivitis 10/17/2018  . Diarrhea 09/04/2018  . Thyroid nodule 08/26/2018  . Drug rash 08/26/2018  . Multiple myeloma (HCC) 07/20/2018  . Multiple myeloma not having achieved remission (HCC) 07/20/2018  . Hematuria 07/11/2018  . Seasonal allergies 06/23/2018  . Right shoulder pain 06/07/2018  . Neck pain 06/07/2018  . Paresthesias 06/07/2018  . 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 Pain in thoracic spine 03/18/2018  . Thoracic back pain 02/15/2018  . 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 Cystitis 10/25/2017  . C. difficile colitis 09/15/2017  . Hypokalemia  08/20/2017  . Anemia 08/20/2017  . Elevated sed rate 08/15/2017  . 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 Epigastric pain 03/22/2017  . Myalgia 12/31/2016  . SOB (shortness of breath) 07/26/2016  . Abnormal SPEP 07/26/2016  . Low back pain 07/14/2016  . Insomnia 12/29/2015  . Tension headache 10/21/2015  . Muscle spasms of neck 10/18/2015  . Dysuria 10/13/2015  . Sinusitis 07/07/2015  . AP (abdominal pain) 07/07/2015  . Cardiomyopathy (HCC) 04/19/2015  . Chest wall pain 04/19/2015  . Obstructive sleep apnea 03/31/2015  . Congestive heart failure (HCC) 02/24/2015  . Arthritis   . Hyperlipidemia   . Hypertension   .   Depression   . Stroke ([place])   . GERD (gastroesophageal reflux disease)   . Bowel obstruction ([place]) 11/09/2014    Past Surgical History:  Procedure Laterality Date  . ABDOMINAL HYSTERECTOMY     menorraghia, 2006, total  . CHOLECYSTECTOMY    . COLONOSCOPY  2018   [facility] per patient  . ESOPHAGOGASTRODUODENOSCOPY  2018   [facility]   . KNEE SURGERY     right, repair torn torn cartialage  . PACEMAKER INSERTION  08/2014  . TONSILLECTOMY    . TUBAL LIGATION      Current Outpatient Medications  Medication Sig Dispense Refill  . acetaminophen (TYLENOL) 325 MG tablet Take 650 mg by mouth.    . ACETAMINOPHEN-BUTALBITAL 50-325 MG TABS Take 1 tablet by mouth 2 (two) times daily as needed. 120 tablet 1  . albuterol (PROVENTIL HFA;VENTOLIN HFA) 108 (90 Base) MCG/ACT inhaler Inhale 2 puffs into the lungs every 6 (six) hours as needed for wheezing or shortness of breath. 1 Inhaler 2  . ALPRAZolam (XANAX) 0.25 MG tablet Take 1-2 tabs (0.20m-0.50mg) 30-60 minutes before procedure. May repeat if needed.Do not drive. 4 tablet 0  . amLODipine (NORVASC) 5 MG tablet 1 tab in am and 2 tabs po pm 90 tablet 1  . amoxicillin-clavulanate (AUGMENTIN) 875-125 MG tablet Take 1 tablet by mouth 2 (two) times daily. 20 tablet 0  .  aspirin EC 81 MG tablet Take 1 tablet (81 mg total) by mouth daily. 90 tablet 1  . Carboxymethylcellulose Sodium (ARTIFICIAL TEARS OP) Apply to eye.    . carvedilol (COREG) 25 MG tablet Take 1 tablet (25 mg total) by mouth 2 (two) times daily with a meal. 180 tablet 1  . conjugated estrogens (PREMARIN) vaginal cream Place vaginally.    . cyclobenzaprine (FLEXERIL) 10 MG tablet Take 10 mg by mouth as needed.     . famciclovir (FAMVIR) 250 MG tablet Take 1 tablet (250 mg total) by mouth daily. 30 tablet 12  . famotidine (PEPCID) 40 MG/5ML suspension Take 5 mLs (40 mg total) by mouth at bedtime as needed for heartburn or indigestion. 150 mL 2  . fluticasone (FLONASE) 50 MCG/ACT nasal spray Place 2 sprays into both nostrils daily. (Patient taking differently: Place 2 sprays into both nostrils as needed. ) 16 g 1  . furosemide (LASIX) 20 MG tablet Take 1 tablet (20 mg total) by mouth daily as needed for fluid or edema. 30 tablet 2  . HYDROcodone-acetaminophen (NORCO/VICODIN) 5-325 MG tablet Take 1 tablet by mouth every 4 (four) hours as needed for moderate pain or severe pain. 20 tablet 0  . hyoscyamine (LEVSIN SL) 0.125 MG SL tablet Place 1 tablet (0.125 mg total) under the tongue every 4 (four) hours as needed. 30 tablet 2  . lidocaine (LIDODERM) 5 % Place 1 patch onto the skin daily. Remove & Discard patch within 12 hours or as directed by MD 30 patch 2  . loratadine (CLARITIN) 10 MG tablet Take 1 tablet (10 mg total) by mouth 2 (two) times daily as needed for allergies. 30 tablet 11  . losartan (COZAAR) 25 MG tablet Take by mouth.    . metoCLOPramide (REGLAN) 10 MG tablet Take 1 tablet (10 mg total) by mouth 4 (four) times daily. For migraine or headache or nausea 30 tablet 6  . Multiple Vitamins-Minerals (MULTIVITAMIN ADULT PO) Take by mouth.    . ondansetron (ZOFRAN) 4 MG tablet Take 1 tablet (4 mg total) by mouth every 8 (eight) hours  as needed for nausea or vomiting. 30 tablet 1  . pantoprazole  (PROTONIX) 40 MG tablet Take 1 tablet (40 mg total) by mouth 2 (two) times daily. 180 tablet 1  . pomalidomide (POMALYST) 2 MG capsule Take 1 capsule (2 mg total) by mouth daily. Every 21 days. Auth #[id number] 21 capsule 0  . promethazine (PHENERGAN) 25 MG tablet Take 1 tablet (25 mg total) by mouth every 8 (eight) hours as needed for nausea or vomiting. 30 tablet 2  . rizatriptan (MAXALT) 10 MG tablet SMARTSIG:1 Tablet(s) By Mouth As Needed    . sucralfate (CARAFATE) 1 g tablet Take 1 tablet (1 g total) by mouth 4 (four) times daily -  with meals and at bedtime. 120 tablet 0  . SUMAtriptan (IMITREX) 100 MG tablet 1 tab PO at headache onset, may repeat in 2 hrs as needed. No more than 2 tabs per day or 4 tabs per wk. (Can dispense #9 if quality limit)    . topiramate (TOPAMAX) 50 MG tablet Take 1 tablet (50 mg total) by mouth 2 (two) times daily. 60 tablet 3  . triamterene-hydrochlorothiazide (MAXZIDE-25) 37.5-25 MG tablet Take 1 tablet by mouth daily.    . Galcanezumab-gnlm (EMGALITY) 120 MG/ML SOAJ Inject 120 mg into the skin every 30 (thirty) days. 1 pen 11  . potassium chloride SA (K-DUR) 20 MEQ tablet Take 2 tablets (40 mEq total) by mouth once for 1 dose. 60 tablet 3  . Rimegepant Sulfate (NURTEC) 75 MG TBDP Take 75 mg by mouth daily as needed. For migraines. Take as close to onset of migraine as possible. One daily maximum. 10 tablet 6   No current facility-administered medications for this visit.     Allergies as of 10/17/2019 - Review Complete 10/17/2019  Allergen Reaction Noted  . Benazepril Anaphylaxis and Swelling 09/15/2016  . Ondansetron hcl Hives 07/05/2017  . Codeine Nausea And Vomiting 02/21/2015  . Morphine Hives 07/05/2017    Vitals: BP 130/79 (BP Location: Right Arm, Patient Position: Sitting)   Pulse 77   Temp (!) 96.9 F (36.1 C) Comment: taken at front door  Ht 5' 6" (1.676 m)   Wt 198 lb (89.8 kg)   BMI 31.96 kg/m  Last Weight:  Wt Readings from Last 1  Encounters:  10/17/19 198 lb (89.8 kg)   Last Height:   Ht Readings from Last 1 Encounters:  10/17/19 5' 6" (1.676 m)     Physical exam: Exam: Gen: NAD, conversant, well nourised, obese, well groomed                     CV: RRR, no MRG. No Carotid Bruits. No peripheral edema, warm, nontender Eyes: Conjunctivae clear without exudates or hemorrhage  Neuro: Detailed Neurologic Exam  Speech:    Speech is normal; fluent and spontaneous with normal comprehension.  Cognition:    The patient is oriented to person, place, and time;     recent and remote memory intact;     language fluent;     normal attention, concentration,     fund of knowledge Cranial Nerves:    The pupils are equal, round, and reactive to light. Fundi are flat. Visual fields are full to finger confrontation. Vertical ophthalmoplegia and proptosis. Trigeminal sensation is intact and the muscles of mastication are normal. The face is symmetric. The palate elevates in the midline. Hearing intact. Voice is normal. Shoulder shrug is normal. The tongue has normal motion without fasciculations.   Coordination:      Normal finger to nose  Gait:  Normal native gait  Motor Observation:    No asymmetry, no atrophy, and no involuntary movements noted. Tone:    Normal muscle tone.    Posture:    Posture is normal. normal erect    Strength:    Strength is V/V in the upper and lower limbs.      Sensation: intact to LT     Reflex Exam:  DTR's:    Deep tendon reflexes in the upper and lower extremities are symmetrical Toes:    The toes are downgoing bilaterally.   Clonus:    Clonus is absent.    Assessment/Plan:  Lovely patient with worsening headaches and possible aneurysm (MRI 2006 never followed up), also  vertical opthalmoplegia and proptosis. She needs MRI of the brain and MRA of the head to evaluate for SAH, enlarging Aneurysm, space occupying mass or compressive mass or complications from her multiple myeloma.  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  CTA was negative for aneurysm. CT of the head was normal. She continues to have headaches and she has tried and failed multiple medications over the years. She has 8 migraine days a month and almost daily headaches. She tried Aimovig but having constipation we will change to emgality. Injected Emgality, stop Aimovig and   Medications tried that can be used for migraine prevention/acute: Reglan, losartan, zofran, phenergan, topamax, imitrex, maxalt, norvasc, fioricet, carvedilol, flexeril, compazine, maxide, verapamil, tizanidine.  Headache acute management: failed multiple. Cannot take triptans due to hx of TIA or stroke. Can try Nurtec acutely  Next would try Ajovy and/or botox.  Neuropathy: Pomalyst can cause, also she is pre-diabetic, she has multiple myeloma, all can be implicated with distal polyneuropathy. will check hgba1c and b12  Ear pressure, appears to have wax in ears, f/u with pcp, gave her some tips on home remedies to help wax in the ears.   Meds ordered this encounter  Medications  . Galcanezumab-gnlm (EMGALITY) 120 MG/ML SOAJ    Sig: Inject 120 mg into the skin every 30 (thirty) days.    Dispense:  1 pen    Refill:  11  . Rimegepant Sulfate (NURTEC) 75 MG TBDP    Sig: Take 75 mg by mouth daily as needed. For migraines. Take as close to onset of migraine as possible. One daily maximum.    Dispense:  10 tablet    Refill:  6    Cc: Mosie Lukes, MD,  Mosie Lukes, MD  Sarina Ill, MD  [facility] [address] [place], [place] [address]  Phone [phone] Fax [phone]  A total of 40 minutes was spent face-to-face with this patient. Over half this time was spent on counseling patient on the  1. Chronic migraine without aura, with intractable migraine, so stated, with status migrainosus    diagnosis and different diagnostic and therapeutic options, counseling and coordination of care,  risks ans benefits of management, compliance, or risk factor reduction and education.

## 2019-10-17 NOTE — Patient Instructions (Addendum)
Galcanezumab injection What is this medicine? GALCANEZUMAB (gal ka NEZ ue mab) is used to prevent migraines and treat cluster headaches. This medicine may be used for other purposes; ask your health care provider or pharmacist if you have questions. COMMON BRAND NAME(S): Emgality What should I tell my health care provider before I take this medicine? They need to know if you have any of these conditions:  an unusual or allergic reaction to galcanezumab, other medicines, foods, dyes, or preservatives  pregnant or trying to get pregnant  breast-feeding How should I use this medicine? This medicine is for injection under the skin. You will be taught how to prepare and give this medicine. Use exactly as directed. Take your medicine at regular intervals. Do not take your medicine more often than directed. It is important that you put your used needles and syringes in a special sharps container. Do not put them in a trash can. If you do not have a sharps container, call your pharmacist or healthcare provider to get one. Talk to your pediatrician regarding the use of this medicine in children. Special care may be needed. Overdosage: If you think you have taken too much of this medicine contact a poison control center or emergency room at once. NOTE: This medicine is only for you. Do not share this medicine with others. What if I miss a dose? If you miss a dose, take it as soon as you can. If it is almost time for your next dose, take only that dose. Do not take double or extra doses. What may interact with this medicine? Interactions are not expected. This list may not describe all possible interactions. Give your health care provider a list of all the medicines, herbs, non-prescription drugs, or dietary supplements you use. Also tell them if you smoke, drink alcohol, or use illegal drugs. Some items may interact with your medicine. What should I watch for while using this medicine? Tell your  doctor or healthcare professional if your symptoms do not start to get better or if they get worse. What side effects may I notice from receiving this medicine? Side effects that you should report to your doctor or health care professional as soon as possible:  allergic reactions like skin rash, itching or hives, swelling of the face, lips, or tongue Side effects that usually do not require medical attention (report these to your doctor or health care professional if they continue or are bothersome):  pain, redness, or irritation at site where injected This list may not describe all possible side effects. Call your doctor for medical advice about side effects. You may report side effects to FDA at 1-800-FDA-1088. Where should I keep my medicine? Keep out of the reach of children. You will be instructed on how to store this medicine. Throw away any unused medicine after the expiration date on the label. NOTE: This sheet is a summary. It may not cover all possible information. If you have questions about this medicine, talk to your doctor, pharmacist, or health care provider.  2020 Elsevier/Gold Standard (2018-04-13 12:03:23)   Rimegepant: Patient drug information Access Lexicomp Online here. Copyright 6508215230 [employer]. All rights reserved. (For additional information see "Rimegepant: Drug information") Brand Names: [place]  Nurtec  What is this drug used for?   It is used to treat migraine headaches.  What do I need to tell my doctor BEFORE I take this drug?   If you are allergic to this drug; any part of this drug; or  any other drugs, foods, or substances. Tell your doctor about the allergy and what signs you had.   If you have any of these health problems: Kidney disease or liver disease.   If you take any drugs (prescription or OTC, natural products, vitamins) that must not be taken with this drug, like certain drugs that are used for HIV, infections, or seizures. There are many  drugs that must not be taken with this drug.   This is not a list of all drugs or health problems that interact with this drug.   Tell your doctor and pharmacist about all of your drugs (prescription or OTC, natural products, vitamins) and health problems. You must check to make sure that it is safe for you to take this drug with all of your drugs and health problems. Do not start, stop, or change the dose of any drug without checking with your doctor.  What are some things I need to know or do while I take this drug?   Tell all of your health care providers that you take this drug. This includes your doctors, nurses, pharmacists, and dentists.   This drug is not meant to prevent or lower the number of migraine headaches you get.   Tell your doctor if you are pregnant, plan on getting pregnant, or are breast-feeding. You will need to talk about the benefits and risks to you and the baby.  What are some side effects that I need to call my doctor about right away?   WARNING/CAUTION: Even though it may be rare, some people may have very bad and sometimes deadly side effects when taking a drug. Tell your doctor or get medical help right away if you have any of the following signs or symptoms that may be related to a very bad side effect:   Signs of an allergic reaction, like rash; hives; itching; red, swollen, blistered, or peeling skin with or without fever; wheezing; tightness in the chest or throat; trouble breathing, swallowing, or talking; unusual hoarseness; or swelling of the mouth, face, lips, tongue, or throat.  What are some other side effects of this drug?   All drugs may cause side effects. However, many people have no side effects or only have minor side effects. Call your doctor or get medical help if any of these side effects or any other side effects bother you or do not go away:   Upset stomach.   These are not all of the side effects that may occur. If you have questions about side  effects, call your doctor. Call your doctor for medical advice about side effects.   You may report side effects to your national health agency.  How is this drug best taken?   Use this drug as ordered by your doctor. Read all information given to you. Follow all instructions closely.   Do not push the tablet out of the foil when opening. Use dry hands to take it from the foil. Place on your tongue and let it dissolve. Water is not needed. Do not swallow it whole. Do not chew, break, or crush it.   If needed, you may place the tablet under the tongue.   Use right after opening.  What do I do if I miss a dose?   This drug is taken on an as needed basis. Do not take more often than told by the doctor.  How do I store and/or throw out this drug?   Store at room temperature  in a dry place. Do not store in a bathroom.   Store in foil pouch until ready for use.   Keep all drugs in a safe place. Keep all drugs out of the reach of children and pets.   Throw away unused or expired drugs. Do not flush down a toilet or pour down a drain unless you are told to do so. Check with your pharmacist if you have questions about the best way to throw out drugs. There may be drug take-back programs in your area.  General drug facts   If your symptoms or health problems do not get better or if they become worse, call your doctor.   Do not share your drugs with others and do not take anyone else's drugs.   Some drugs may have another patient information leaflet. If you have any questions about this drug, please talk with your doctor, nurse, pharmacist, or other health care provider.   If you think there has been an overdose, call your poison control center or get medical care right away. Be ready to tell or show what was taken, how much, and when it happened.

## 2019-10-18 ENCOUNTER — Telehealth: Payer: Self-pay | Admitting: *Deleted

## 2019-10-18 NOTE — Telephone Encounter (Signed)
Nurtec PA completed on CMM. Key: [id number]. Awaiting determination from [facility] within 72 hours.

## 2019-10-18 NOTE — Telephone Encounter (Signed)
Completed Emgality PA on CMM. [id number]. Awaiting determination from [facility].

## 2019-10-19 ENCOUNTER — Inpatient Hospital Stay: Payer: Medicare Other | Attending: Hematology & Oncology

## 2019-10-19 ENCOUNTER — Encounter: Payer: Self-pay | Admitting: Family Medicine

## 2019-10-19 ENCOUNTER — Other Ambulatory Visit: Payer: Self-pay

## 2019-10-19 ENCOUNTER — Inpatient Hospital Stay: Payer: Medicare Other

## 2019-10-19 ENCOUNTER — Inpatient Hospital Stay (HOSPITAL_BASED_OUTPATIENT_CLINIC_OR_DEPARTMENT_OTHER): Payer: Medicare Other | Admitting: Family

## 2019-10-19 ENCOUNTER — Encounter: Payer: Self-pay | Admitting: Family

## 2019-10-19 ENCOUNTER — Ambulatory Visit (INDEPENDENT_AMBULATORY_CARE_PROVIDER_SITE_OTHER): Payer: Medicare Other | Admitting: Family Medicine

## 2019-10-19 VITALS — BP 144/76 | HR 88 | Temp 97.1°F | Resp 12 | Ht 67.0 in | Wt 190.4 lb

## 2019-10-19 DIAGNOSIS — J329 Chronic sinusitis, unspecified: Secondary | ICD-10-CM | POA: Diagnosis not present

## 2019-10-19 DIAGNOSIS — Z5112 Encounter for antineoplastic immunotherapy: Secondary | ICD-10-CM | POA: Insufficient documentation

## 2019-10-19 DIAGNOSIS — H9203 Otalgia, bilateral: Secondary | ICD-10-CM

## 2019-10-19 DIAGNOSIS — D801 Nonfamilial hypogammaglobulinemia: Secondary | ICD-10-CM

## 2019-10-19 DIAGNOSIS — R030 Elevated blood-pressure reading, without diagnosis of hypertension: Secondary | ICD-10-CM | POA: Diagnosis not present

## 2019-10-19 DIAGNOSIS — C9000 Multiple myeloma not having achieved remission: Secondary | ICD-10-CM | POA: Diagnosis not present

## 2019-10-19 DIAGNOSIS — D509 Iron deficiency anemia, unspecified: Secondary | ICD-10-CM

## 2019-10-19 LAB — CMP (CANCER CENTER ONLY)
ALT: 14 U/L (ref 0–44)
AST: 19 U/L (ref 15–41)
Albumin: 4.2 g/dL (ref 3.5–5.0)
Alkaline Phosphatase: 45 U/L (ref 38–126)
Anion gap: 7 (ref 5–15)
BUN: 11 mg/dL (ref 8–23)
CO2: 29 mmol/L (ref 22–32)
Calcium: 9.7 mg/dL (ref 8.9–10.3)
Chloride: 104 mmol/L (ref 98–111)
Creatinine: 0.7 mg/dL (ref 0.44–1.00)
GFR, Est AFR Am: >60 mL/min (ref >60)
GFR, Estimated: >60 mL/min (ref >60)
Glucose, Bld: 110 mg/dL — ABNORMAL HIGH (ref 70–99)
Potassium: 3.5 mmol/L (ref 3.5–5.1)
Sodium: 140 mmol/L (ref 135–145)
Total Bilirubin: 0.4 mg/dL (ref 0.3–1.2)
Total Protein: 9 g/dL — ABNORMAL HIGH (ref 6.5–8.1)

## 2019-10-19 LAB — CBC WITH DIFFERENTIAL (CANCER CENTER ONLY)
Abs Immature Granulocytes: 0.01 10*3/uL (ref 0.00–0.07)
Basophils Absolute: 0 10*3/uL (ref 0.0–0.1)
Basophils Relative: 1 %
Eosinophils Absolute: 0.1 10*3/uL (ref 0.0–0.5)
Eosinophils Relative: 4 %
HCT: 39.6 % (ref 36.0–46.0)
Hemoglobin: 12.6 g/dL (ref 12.0–15.0)
Immature Granulocytes: 0 %
Lymphocytes Relative: 35 %
Lymphs Abs: 0.9 10*3/uL (ref 0.7–4.0)
MCH: 28.6 pg (ref 26.0–34.0)
MCHC: 31.8 g/dL (ref 30.0–36.0)
MCV: 89.8 fL (ref 80.0–100.0)
Monocytes Absolute: 0.2 10*3/uL (ref 0.1–1.0)
Monocytes Relative: 7 %
Neutro Abs: 1.3 10*3/uL — ABNORMAL LOW (ref 1.7–7.7)
Neutrophils Relative %: 53 %
Platelet Count: 161 10*3/uL (ref 150–400)
RBC: 4.41 MIL/uL (ref 3.87–5.11)
RDW: 13.6 % (ref 11.5–15.5)
WBC Count: 2.5 10*3/uL — ABNORMAL LOW (ref 4.0–10.5)
nRBC: 0 % (ref 0.0–0.2)

## 2019-10-19 MED ORDER — BORTEZOMIB CHEMO SQ INJECTION 3.5 MG (2.5MG/ML)
1.3000 mg/m2 | Freq: Once | INTRAMUSCULAR | Status: AC
  Administered 2019-10-19: 2.5 mg via SUBCUTANEOUS
  Filled 2019-10-19: qty 1

## 2019-10-19 MED ORDER — AMOXICILLIN 500 MG PO CAPS: 1000.0000 mg | ORAL_CAPSULE | Freq: Two times a day (BID) | ORAL | 0 refills | Status: DC

## 2019-10-19 MED ORDER — PROCHLORPERAZINE MALEATE 10 MG PO TABS: 10.0000 mg | ORAL_TABLET | Freq: Once | ORAL | Status: DC

## 2019-10-19 NOTE — Progress Notes (Signed)
Ok to treat with anc of 1.3 per Dr Marin Olp. dph

## 2019-10-19 NOTE — Progress Notes (Signed)
[facility] [address], [place], [place] [address] [phone]  Date:  10/19/2019   Name:  Tami [name]   DOB:  12/26/1955   MRN:  [id number]  PCP:  Mosie Lukes, MD    Chief Complaint: Otalgia (bilateral)   History of Present Illness:  Tami [name] is a 62 y.o. very pleasant female patient who presents with the following:  Primary patient of my partner Dr. Charlett Blake with history of stroke, ocular migraine, hypertension, CHF, multiple myeloma I haave not seen this patient in the past She was seen by neurology 2 days ago for her chronic headaches, and she was complaining of ear pain bilaterally-they noted that she had some wax in her ears, we will check on this for her today She has noted ear pain for the last 4 days.  She notes that it feels like it ear infection She notes that "my mood swing is off" which to her means her balance is off Her hearing is ok No falling She does have some ringing and buzzing in her ears for the last several weeks  She notes that she has some numbness in her bilateral feet, thought to be due to her MM treatment.  Otherwise no particular numbness or weakness in any part of her body  Patient Active Problem List   Diagnosis Date Noted  . Chronic migraine without aura, with intractable migraine, so stated, with status migrainosus 10/17/2019  . ETD (Eustachian tube dysfunction), bilateral 09/12/2019  . Urinary frequency 07/17/2019  . Headache 07/17/2019  . Palpitation 05/15/2019  . Urticaria 02/22/2019  . Conjunctivitis 10/17/2018  . Diarrhea 09/04/2018  . Thyroid nodule 08/26/2018  . Drug rash 08/26/2018  . Multiple myeloma ([place]) 07/20/2018  . Multiple myeloma not having achieved remission ([place]) 07/20/2018  . Hematuria 07/11/2018  . Seasonal allergies 06/23/2018  . Right shoulder pain 06/07/2018  . Neck pain 06/07/2018  . Paresthesias 06/07/2018  . 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 Pain in thoracic spine 03/18/2018  . Thoracic back pain 02/15/2018  . 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 Cystitis 10/25/2017  . C. difficile colitis 09/15/2017  . Hypokalemia 08/20/2017  . Anemia 08/20/2017  . Elevated sed rate 08/15/2017  . 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 Epigastric pain 03/22/2017  . Myalgia 12/31/2016  . SOB (shortness of breath) 07/26/2016  . Abnormal SPEP 07/26/2016  . Low back pain 07/14/2016  . Insomnia 12/29/2015  . Tension headache 10/21/2015  . Muscle spasms of neck 10/18/2015  . Dysuria 10/13/2015  . Sinusitis 07/07/2015  . AP (abdominal pain) 07/07/2015  . Cardiomyopathy ([place]) 04/19/2015  . Chest wall pain 04/19/2015  . Obstructive sleep apnea 03/31/2015  . Congestive heart failure ([place]) 02/24/2015  . Arthritis   . Hyperlipidemia   . Hypertension   . Depression   . Stroke ([place])   . GERD (gastroesophageal reflux disease)   . Bowel obstruction ([place]) 11/09/2014    Past Medical History:  Diagnosis Date  . Abnormal SPEP 07/26/2016  . Arthritis    knees, hands  . Back pain 07/14/2016  . Bowel obstruction ([place]) 11/2014  . C. difficile diarrhea   . CHF (congestive heart failure) ([place])   . Colitis   . Congestive heart failure ([place]) 02/24/2015   S/p pacemaker  . Depression   . Elevated sed rate 08/15/2017  . Epigastric pain 03/22/2017  . GERD (gastroesophageal reflux disease)   .  Heart disease   . Hyperlipidemia   . Hypertension   . 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 Low back pain 07/14/2016  . 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 Multiple myeloma ([place]) 07/20/2018  . Multiple myeloma not having achieved remission ([place]) 07/20/2018  . Myalgia 12/31/2016  . Obstructive sleep apnea 03/31/2015  . SOB (shortness of breath) 04/09/2016  . Stroke ([place])    TIAs  . UTI (urinary tract infection)     Past Surgical History:  Procedure Laterality Date  . ABDOMINAL  HYSTERECTOMY     menorraghia, 2006, total  . CHOLECYSTECTOMY    . COLONOSCOPY  2018   [facility] per patient  . ESOPHAGOGASTRODUODENOSCOPY  2018   [facility]   . KNEE SURGERY     right, repair torn torn cartialage  . PACEMAKER INSERTION  08/2014  . TONSILLECTOMY    . TUBAL LIGATION      Social History   Tobacco Use  . Smoking status: Never Smoker  . Smokeless tobacco: Never Used  Substance Use Topics  . Alcohol use: No  . Drug use: No    Family History  Problem Relation Age of Onset  . Diabetes Mother   . Hypertension Mother   . Heart disease Mother        s/p 1 stent  . Hyperlipidemia Mother   . Arthritis Mother   . Cancer Father        COLON  . Colon cancer Father 72  . Irritable bowel syndrome Sister   . Hyperlipidemia Daughter   . Hypertension Daughter   . Hypertension Maternal Grandmother   . Arthritis Maternal Grandmother   . Heart disease Maternal Grandfather        MI  . Hypertension Maternal Grandfather   . Arthritis Maternal Grandfather   . Hypertension Son   . Esophageal cancer Neg Hx     Allergies  Allergen Reactions  . Benazepril Anaphylaxis and Swelling    angioedema Throat and lip swelling  . Ondansetron Hcl Hives    Redness and hives post IV admin on 07/05/17  . Codeine Nausea And Vomiting  . Morphine Hives    Redness and hives noted post IV admin on 07/05/17    Medication list has been reviewed and updated.  Current Outpatient Medications on File Prior to Visit  Medication Sig Dispense Refill  . acetaminophen (TYLENOL) 325 MG tablet Take 650 mg by mouth.    . ACETAMINOPHEN-BUTALBITAL 50-325 MG TABS Take 1 tablet by mouth 2 (two) times daily as needed. 120 tablet 1  . albuterol (PROVENTIL HFA;VENTOLIN HFA) 108 (90 Base) MCG/ACT inhaler Inhale 2 puffs into the lungs every 6 (six) hours as needed for wheezing or shortness of breath. 1 Inhaler 2  . ALPRAZolam (XANAX) 0.25 MG tablet Take 1-2 tabs (0.6m-0.50mg)  30-60 minutes before procedure. May repeat if needed.Do not drive. 4 tablet 0  . amLODipine (NORVASC) 5 MG tablet 1 tab in am and 2 tabs po pm 90 tablet 1  . amoxicillin-clavulanate (AUGMENTIN) 875-125 MG tablet Take 1 tablet by mouth 2 (two) times daily. 20 tablet 0  . aspirin EC 81 MG tablet Take 1 tablet (81 mg total) by mouth daily. 90 tablet 1  . Carboxymethylcellulose Sodium (ARTIFICIAL TEARS OP) Apply to eye.    . carvedilol (COREG) 25 MG tablet Take 1 tablet (25 mg total) by mouth 2 (two) times daily with a meal. 180 tablet 1  . conjugated estrogens (PREMARIN) vaginal cream Place vaginally.    .Marland Kitchen  cyclobenzaprine (FLEXERIL) 10 MG tablet Take 10 mg by mouth as needed.     . famciclovir (FAMVIR) 250 MG tablet Take 1 tablet (250 mg total) by mouth daily. 30 tablet 12  . famotidine (PEPCID) 40 MG/5ML suspension Take 5 mLs (40 mg total) by mouth at bedtime as needed for heartburn or indigestion. 150 mL 2  . fluticasone (FLONASE) 50 MCG/ACT nasal spray Place 2 sprays into both nostrils daily. (Patient taking differently: Place 2 sprays into both nostrils as needed. ) 16 g 1  . furosemide (LASIX) 20 MG tablet Take 1 tablet (20 mg total) by mouth daily as needed for fluid or edema. 30 tablet 2  . Galcanezumab-gnlm (EMGALITY) 120 MG/ML SOAJ Inject 120 mg into the skin every 30 (thirty) days. 1 pen 11  . HYDROcodone-acetaminophen (NORCO/VICODIN) 5-325 MG tablet Take 1 tablet by mouth every 4 (four) hours as needed for moderate pain or severe pain. 20 tablet 0  . hyoscyamine (LEVSIN SL) 0.125 MG SL tablet Place 1 tablet (0.125 mg total) under the tongue every 4 (four) hours as needed. 30 tablet 2  . lidocaine (LIDODERM) 5 % Place 1 patch onto the skin daily. Remove & Discard patch within 12 hours or as directed by MD 30 patch 2  . loratadine (CLARITIN) 10 MG tablet Take 1 tablet (10 mg total) by mouth 2 (two) times daily as needed for allergies. 30 tablet 11  . losartan (COZAAR) 25 MG tablet Take by  mouth.    . metoCLOPramide (REGLAN) 10 MG tablet Take 1 tablet (10 mg total) by mouth 4 (four) times daily. For migraine or headache or nausea 30 tablet 6  . Multiple Vitamins-Minerals (MULTIVITAMIN ADULT PO) Take by mouth.    . ondansetron (ZOFRAN) 4 MG tablet Take 1 tablet (4 mg total) by mouth every 8 (eight) hours as needed for nausea or vomiting. 30 tablet 1  . pantoprazole (PROTONIX) 40 MG tablet Take 1 tablet (40 mg total) by mouth 2 (two) times daily. 180 tablet 1  . pomalidomide (POMALYST) 2 MG capsule Take 1 capsule (2 mg total) by mouth daily. Every 21 days. Auth #[id number] 21 capsule 0  . promethazine (PHENERGAN) 25 MG tablet Take 1 tablet (25 mg total) by mouth every 8 (eight) hours as needed for nausea or vomiting. 30 tablet 2  . Rimegepant Sulfate (NURTEC) 75 MG TBDP Take 75 mg by mouth daily as needed. For migraines. Take as close to onset of migraine as possible. One daily maximum. 10 tablet 6  . rizatriptan (MAXALT) 10 MG tablet SMARTSIG:1 Tablet(s) By Mouth As Needed    . sucralfate (CARAFATE) 1 g tablet Take 1 tablet (1 g total) by mouth 4 (four) times daily -  with meals and at bedtime. 120 tablet 0  . SUMAtriptan (IMITREX) 100 MG tablet 1 tab PO at headache onset, may repeat in 2 hrs as needed. No more than 2 tabs per day or 4 tabs per wk. (Can dispense #9 if quality limit)    . topiramate (TOPAMAX) 50 MG tablet Take 1 tablet (50 mg total) by mouth 2 (two) times daily. 60 tablet 3  . triamterene-hydrochlorothiazide (MAXZIDE-25) 37.5-25 MG tablet Take 1 tablet by mouth daily.    . potassium chloride SA (K-DUR) 20 MEQ tablet Take 2 tablets (40 mEq total) by mouth once for 1 dose. 60 tablet 3   No current facility-administered medications on file prior to visit.    Review of Systems:  As per HPI- otherwise negative. No  fever  Physical Examination: Vitals:   10/19/19 1337  BP: (!) 144/76  Pulse: 88  Resp: 12  Temp: (!) 97.1 F (36.2 C)  SpO2: 99%   Vitals:   10/19/19  1337  Weight: 190 lb 6.4 oz (86.4 kg)  Height: _0  (1.702 m)   Body mass index is 29.82 kg/m. Ideal Body Weight: Weight in (lb) to have BMI = 25: 159.3  GEN: WDWN, NAD, Non-toxic, A & O x 3, overweight, otherwise appears well.   HEENT: Atraumatic, Normocephalic. Neck supple. No masses, No LAD.  TM and ear canals are within normal limits bilaterally.  There is no appreciable significant earwax Ears and Nose: No external deformity. CV: RRR, No M/G/R. No JVD. No thrill. No extra heart sounds. PULM: CTA B, no wheezes, crackles, rhonchi. No retractions. No resp. distress. No accessory muscle use. ABD: S, NT, ND, +BS. No rebound. No HSM. EXTR: No c/c/e NEURO Normal gait.  PSYCH: Normally interactive. Conversant. Not depressed or anxious appearing.  Calm demeanor.    Assessment and Plan: Acute ear pain, bilateral - Plan: amoxicillin (AMOXIL) 500 MG capsule  Patient here today with bilateral ear pain for the last 4 days.  This seems to also be causing headaches.  She actually saw neurology a couple of days ago, they thought her ear pain may be due to earwax.  However on exam today she has no significant earwax present.  I explained to the patient that I do not have a definite explanation for her ear pain.  The parts of the ear that are visible to my exam are normal I offered to have her seen in the emergency department for further evaluation and imaging.  She declines at this time, would like to try a course of antibiotics which I think is reasonable.  I called in amoxicillin for 10 days, asked her to please follow-up with [place] if not feeling better within the next couple of days, seek immediate care if getting worse.  She states understanding and agreement This visit occurred during the SARS-CoV-2 public health emergency.  Safety protocols were in place, including screening questions prior to the visit, additional usage of staff PPE, and extensive cleaning of exam room while observing appropriate  contact time as indicated for disinfecting solutions.    Signed Lamar Blinks, MD

## 2019-10-19 NOTE — Progress Notes (Signed)
Hematology and Oncology Follow Up Visit  Camilya Conti [id number] 14-Aug-1956 63 y.o. 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 Guider is here today for follow-up and treatment. She states that she has a bilateral ear infection and has an appointment later today with her PCP for further eval.  She denies fever, chills, n/v, cough, rash, dizziness, SOB, chest pain, palpitations, abdominal pain or changes in bowel or bladder habits.  Over the last 3 months her M-spike has increased from 0.5 to 1.3. IgG level in  November was 2,466 mg/dL and kappa light chains 4.63 mg/dL.  She states that her headaches seem to be getting a little better on the monthly Emgality injections.  No swelling or tenderness in her extremities.  The neuropathy in her feet is unchanged.  No falls or syncopal episodes to report.  No episodes of bleeding. No bruising or petechiae.  She has maintained a good appetite and is staying well hydrated. Her weight is stable.   ECOG Performance Status: 1 - Symptomatic but completely ambulatory  Medications:  Allergies as of 10/19/2019      Reactions   Benazepril Anaphylaxis, Swelling   angioedema Throat and lip swelling   Ondansetron Hcl Hives   Redness and hives post IV admin on 07/05/17   Codeine Nausea And Vomiting   Morphine Hives   Redness and hives noted post IV admin on 07/05/17      Medication List       Accurate as of October 19, 2019 11:20 AM. If you have any questions, ask your nurse or doctor.        acetaminophen 325 MG tablet Commonly known as: TYLENOL Take 650 mg by mouth.   ACETAMINOPHEN-BUTALBITAL 50-325 MG Tabs Take 1 tablet by mouth 2 (two) times daily as needed.   albuterol 108 (90 Base) MCG/ACT inhaler Commonly known as:  VENTOLIN HFA Inhale 2 puffs into the lungs every 6 (six) hours as needed for wheezing or shortness of breath.   ALPRAZolam 0.25 MG tablet Commonly known as: Xanax Take 1-2 tabs (0.25mg -0.50mg ) 30-60 minutes before procedure. May repeat if needed.Do not drive.   amLODipine 5 MG tablet Commonly known as: NORVASC 1 tab in am and 2 tabs po pm   amoxicillin-clavulanate 875-125 MG tablet Commonly known as: AUGMENTIN Take 1 tablet by mouth 2 (two) times daily.   ARTIFICIAL TEARS OP Apply to eye.   aspirin EC 81 MG tablet Take 1 tablet (81 mg total) by mouth daily.   carvedilol 25 MG tablet Commonly known as: COREG Take 1 tablet (25 mg total) by mouth 2 (two) times daily with a meal.   cyclobenzaprine 10 MG tablet Commonly known as: FLEXERIL Take 10 mg by mouth as needed.   Emgality 120 MG/ML Soaj Generic drug: Galcanezumab-gnlm Inject 120 mg into the skin every 30 (thirty) days.   famciclovir 250 MG tablet Commonly known as: FAMVIR Take 1 tablet (250 mg total) by mouth daily.   famotidine 40 MG/5ML suspension Commonly known as: PEPCID Take 5 mLs (40 mg total) by mouth at bedtime as needed for heartburn or indigestion.   fluticasone 50 MCG/ACT nasal spray Commonly known as: FLONASE Place 2 sprays into both nostrils daily. What changed:   when to take this  reasons to take this  furosemide 20 MG tablet Commonly known as: LASIX Take 1 tablet (20 mg total) by mouth daily as needed for fluid or edema.   HYDROcodone-acetaminophen 5-325 MG tablet Commonly known as: NORCO/VICODIN Take 1 tablet by mouth every 4 (four) hours as needed for moderate pain or severe pain.   hyoscyamine 0.125 MG SL tablet Commonly known as: LEVSIN SL Place 1 tablet (0.125 mg total) under the tongue every 4 (four) hours as needed.   lidocaine 5 % Commonly known as: Lidoderm Place 1 patch onto the skin daily. Remove & Discard patch within 12 hours or as directed by MD   loratadine 10 MG  tablet Commonly known as: CLARITIN Take 1 tablet (10 mg total) by mouth 2 (two) times daily as needed for allergies.   losartan 25 MG tablet Commonly known as: COZAAR Take by mouth.   metoCLOPramide 10 MG tablet Commonly known as: Reglan Take 1 tablet (10 mg total) by mouth 4 (four) times daily. For migraine or headache or nausea   MULTIVITAMIN ADULT PO Take by mouth.   Nurtec 75 MG Tbdp Generic drug: Rimegepant Sulfate Take 75 mg by mouth daily as needed. For migraines. Take as close to onset of migraine as possible. One daily maximum.   ondansetron 4 MG tablet Commonly known as: Zofran Take 1 tablet (4 mg total) by mouth every 8 (eight) hours as needed for nausea or vomiting.   pantoprazole 40 MG tablet Commonly known as: PROTONIX Take 1 tablet (40 mg total) by mouth 2 (two) times daily.   pomalidomide 2 MG capsule Commonly known as: POMALYST Take 1 capsule (2 mg total) by mouth daily. Every 21 days. Auth [id number]   potassium chloride SA 20 MEQ tablet Commonly known as: KLOR-CON Take 2 tablets (40 mEq total) by mouth once for 1 dose.   Premarin vaginal cream Generic drug: conjugated estrogens Place vaginally.   promethazine 25 MG tablet Commonly known as: PHENERGAN Take 1 tablet (25 mg total) by mouth every 8 (eight) hours as needed for nausea or vomiting.   rizatriptan 10 MG tablet Commonly known as: MAXALT SMARTSIG:1 Tablet(s) By Mouth As Needed   sucralfate 1 g tablet Commonly known as: Carafate Take 1 tablet (1 g total) by mouth 4 (four) times daily -  with meals and at bedtime.   SUMAtriptan 100 MG tablet Commonly known as: IMITREX 1 tab PO at headache onset, may repeat in 2 hrs as needed. No more than 2 tabs per day or 4 tabs per wk. (Can dispense #9 if quality limit)   topiramate 50 MG tablet Commonly known as: Topamax Take 1 tablet (50 mg total) by mouth 2 (two) times daily.   triamterene-hydrochlorothiazide 37.5-25 MG tablet Commonly known as:  MAXZIDE-25 Take 1 tablet by mouth daily.       Allergies:  Allergies  Allergen Reactions  . Benazepril Anaphylaxis and Swelling    angioedema Throat and lip swelling  . Ondansetron Hcl Hives    Redness and hives post IV admin on 07/05/17  . Codeine Nausea And Vomiting  . Morphine Hives    Redness and hives noted post IV admin on 07/05/17    Past Medical History, Surgical history, Social history, and Family History were reviewed and updated.  Review of Systems: All other 10 point review of systems is negative.   Physical Exam:  vitals were not taken for this visit.   Wt Readings from Last 3 Encounters:  10/17/19 198 lb (89.8 kg)  09/29/19 191 lb (86.6  kg)  09/11/19 192 lb 12.8 oz (87.5 kg)    Ocular: Sclerae unicteric, pupils equal, round and reactive to light Ear-nose-throat: Oropharynx clear, dentition fair Lymphatic: No cervical or supraclavicular adenopathy Lungs no rales or rhonchi, good excursion bilaterally Heart regular rate and rhythm, no murmur appreciated Abd soft, nontender, positive bowel sounds, no liver or spleen tip palpated on exam, no fluid wave  MSK no focal spinal tenderness, no joint edema Neuro: non-focal, well-oriented, appropriate affect Breasts: Deferred   Lab Results  Component Value Date   WBC 2.5 (L) 10/19/2019   HGB 12.6 10/19/2019   HCT 39.6 10/19/2019   MCV 89.8 10/19/2019   PLT 161 10/19/2019   Lab Results  Component Value Date   FERRITIN 169 09/16/2016   IRON 59 09/16/2016   TIBC 281 09/16/2016   UIBC 222 09/16/2016   IRONPCTSAT 21 09/16/2016   Lab Results  Component Value Date   RETICCTPCT 0.8 07/16/2016   RBC 4.41 10/19/2019   RETICCTABS 32,640 07/16/2016   Lab Results  Component Value Date   KPAFRELGTCHN 46.3 (H) 09/29/2019   LAMBDASER 9.4 09/29/2019   KAPLAMBRATIO 4.93 (H) 09/29/2019   Lab Results  Component Value Date   IGGSERUM 2,466 (H) 09/29/2019   IGGSERUM 2,804 (H) 09/29/2019   IGA 204 09/29/2019    IGA 217 09/29/2019   IGMSERUM 103 09/29/2019   IGMSERUM 107 09/29/2019   Lab Results  Component Value Date   TOTALPROTELP 8.0 09/29/2019   ALBUMINELP 3.4 09/29/2019   A1GS 0.3 09/29/2019   A2GS 0.9 09/29/2019   BETS 1.2 09/29/2019   BETA2SER 0.5 07/16/2016   GAMS 2.2 (H) 09/29/2019   MSPIKE 1.3 (H) 09/29/2019   SPEI Comment 07/21/2019     Chemistry      Component Value Date/Time   NA 139 09/29/2019 1328   NA 143 11/08/2017 1127   NA 140 12/31/2016 1000   K 3.5 09/29/2019 1328   K 4.0 11/08/2017 1127   K 3.6 12/31/2016 1000   CL 106 09/29/2019 1328   CL 107 11/08/2017 1127   CO2 26 09/29/2019 1328   CO2 25 11/08/2017 1127   CO2 24 12/31/2016 1000   BUN 18 09/29/2019 1328   BUN 19 11/08/2017 1127   BUN 22.3 12/31/2016 1000   CREATININE 0.73 09/29/2019 1328   CREATININE 0.9 11/08/2017 1127   CREATININE 0.8 12/31/2016 1000      Component Value Date/Time   CALCIUM 9.2 09/29/2019 1328   CALCIUM 9.9 11/08/2017 1127   CALCIUM 9.5 12/31/2016 1000   ALKPHOS 43 09/29/2019 1328   ALKPHOS 44 11/08/2017 1127   ALKPHOS 51 12/31/2016 1000   AST 20 09/29/2019 1328   AST 28 12/31/2016 1000   ALT 12 09/29/2019 1328   ALT 36 11/08/2017 1127   ALT 16 12/31/2016 1000   BILITOT 0.3 09/29/2019 1328   BILITOT 0.46 12/31/2016 1000       Impression and Plan: Ms. Johnigan is a very pleasant 63 yo African American female with a previous IgG kappa MGUS with progression to myeloma. Unfortunately her M-spike is starting to go up.  I spoke with Dr. Marin Olp and we will proceed with Velcade today as planned.  He will see her back in 2 weeks for follow-up. We will see what today's Myeloma blood work reveals and determine if it is time to make a change in her treatment.  We discussed her protein studies and rise in M-spike and is in agreement with the plan. She will contact  our office with any questions or concerns. We can certainly see her sooner if needed.   Laverna Peace, NP 12/10/202011:20  AM

## 2019-10-19 NOTE — Patient Instructions (Signed)
It was good to see you today I am sorry that your ears are hurting I don't see any definite explanation for your pain on your ear exam.  We can try a course of amoxicillin for possible ear or sinus infection, however if this does not help in the next couple of days please let us know- sooner if you are worse!

## 2019-10-19 NOTE — Patient Instructions (Signed)
Bortezomib injection What is this medicine? BORTEZOMIB (bor TEZ oh mib) is a medicine that targets proteins in cancer cells and stops the cancer cells from growing. It is used to treat multiple myeloma and mantle-cell lymphoma. This medicine may be used for other purposes; ask your health care provider or pharmacist if you have questions. COMMON BRAND NAME(S): Velcade What should I tell my health care provider before I take this medicine? They need to know if you have any of these conditions:  diabetes  heart disease  irregular heartbeat  liver disease  on hemodialysis  low blood counts, like low white blood cells, platelets, or hemoglobin  peripheral neuropathy  taking medicine for blood pressure  an unusual or allergic reaction to bortezomib, mannitol, boron, other medicines, foods, dyes, or preservatives  pregnant or trying to get pregnant  breast-feeding How should I use this medicine? This medicine is for injection into a vein or for injection under the skin. It is given by a health care professional in a hospital or clinic setting. Talk to your pediatrician regarding the use of this medicine in children. Special care may be needed. Overdosage: If you think you have taken too much of this medicine contact a poison control center or emergency room at once. NOTE: This medicine is only for you. Do not share this medicine with others. What if I miss a dose? It is important not to miss your dose. Call your doctor or health care professional if you are unable to keep an appointment. What may interact with this medicine? This medicine may interact with the following medications:  ketoconazole  rifampin  ritonavir  St. John's Wort This list may not describe all possible interactions. Give your health care provider a list of all the medicines, herbs, non-prescription drugs, or dietary supplements you use. Also tell them if you smoke, drink alcohol, or use illegal drugs. Some  items may interact with your medicine. What should I watch for while using this medicine? You may get drowsy or dizzy. Do not drive, use machinery, or do anything that needs mental alertness until you know how this medicine affects you. Do not stand or sit up quickly, especially if you are an older patient. This reduces the risk of dizzy or fainting spells. In some cases, you may be given additional medicines to help with side effects. Follow all directions for their use. Call your doctor or health care professional for advice if you get a fever, chills or sore throat, or other symptoms of a cold or flu. Do not treat yourself. This drug decreases your body's ability to fight infections. Try to avoid being around people who are sick. This medicine may increase your risk to bruise or bleed. Call your doctor or health care professional if you notice any unusual bleeding. You may need blood work done while you are taking this medicine. In some patients, this medicine may cause a serious brain infection that may cause death. If you have any problems seeing, thinking, speaking, walking, or standing, tell your doctor right away. If you cannot reach your doctor, urgently seek other source of medical care. Check with your doctor or health care professional if you get an attack of severe diarrhea, nausea and vomiting, or if you sweat a lot. The loss of too much body fluid can make it dangerous for you to take this medicine. Do not become pregnant while taking this medicine or for at least 7 months after stopping it. Women should inform their doctor   if they wish to become pregnant or think they might be pregnant. Men should not father a child while taking this medicine and for at least 4 months after stopping it. There is a potential for serious side effects to an unborn child. Talk to your health care professional or pharmacist for more information. Do not breast-feed an infant while taking this medicine or for 2  months after stopping it. This medicine may interfere with the ability to have a child. You should talk with your doctor or health care professional if you are concerned about your fertility. What side effects may I notice from receiving this medicine? Side effects that you should report to your doctor or health care professional as soon as possible:  allergic reactions like skin rash, itching or hives, swelling of the face, lips, or tongue  breathing problems  changes in hearing  changes in vision  fast, irregular heartbeat  feeling faint or lightheaded, falls  pain, tingling, numbness in the hands or feet  right upper belly pain  seizures  swelling of the ankles, feet, hands  unusual bleeding or bruising  unusually weak or tired  vomiting  yellowing of the eyes or skin Side effects that usually do not require medical attention (report to your doctor or health care professional if they continue or are bothersome):  changes in emotions or moods  constipation  diarrhea  loss of appetite  headache  irritation at site where injected  nausea This list may not describe all possible side effects. Call your doctor for medical advice about side effects. You may report side effects to FDA at 1-800-FDA-1088. Where should I keep my medicine? This drug is given in a hospital or clinic and will not be stored at home. NOTE: This sheet is a summary. It may not cover all possible information. If you have questions about this medicine, talk to your doctor, pharmacist, or health care provider.  2020 [employer] (2018-03-07 16:29:31)  

## 2019-10-19 NOTE — Telephone Encounter (Signed)
Per CMM:   Request Reference Number: [id number]. NURTEC TAB 75MG  ODT is approved through 11/08/2020. For further questions, call [phone].   We received an approval notice as well and I faxed this to [facility]. Received a receipt of confirmation.

## 2019-10-20 ENCOUNTER — Other Ambulatory Visit: Payer: Self-pay | Admitting: Family

## 2019-10-20 LAB — PROTEIN ELECTROPHORESIS, SERUM
A/G Ratio: 0.7 (ref 0.7–1.7)
Albumin ELP: 3.5 g/dL (ref 2.9–4.4)
Alpha-1-Globulin: 0.2 g/dL (ref 0.0–0.4)
Alpha-2-Globulin: 1 g/dL (ref 0.4–1.0)
Beta Globulin: 1.2 g/dL (ref 0.7–1.3)
Gamma Globulin: 2.5 g/dL — ABNORMAL HIGH (ref 0.4–1.8)
Globulin, Total: 5 g/dL — ABNORMAL HIGH (ref 2.2–3.9)
M-Spike, %: 1.5 g/dL — ABNORMAL HIGH
Total Protein ELP: 8.5 g/dL (ref 6.0–8.5)

## 2019-10-20 LAB — IRON AND TIBC
Iron: 59 ug/dL (ref 41–142)
Saturation Ratios: 17 % — ABNORMAL LOW (ref 21–57)
TIBC: 343 ug/dL (ref 236–444)
UIBC: 284 ug/dL (ref 120–384)

## 2019-10-20 LAB — KAPPA/LAMBDA LIGHT CHAINS
Kappa free light chain: 45 mg/L — ABNORMAL HIGH (ref 3.3–19.4)
Kappa, lambda light chain ratio: 3.81 — ABNORMAL HIGH (ref 0.26–1.65)
Lambda free light chains: 11.8 mg/L (ref 5.7–26.3)

## 2019-10-20 LAB — IGG, IGA, IGM
IgA: 232 mg/dL (ref 87–352)
IgG (Immunoglobin G), Serum: 2869 mg/dL — ABNORMAL HIGH (ref 586–1602)
IgM (Immunoglobulin M), Srm: 108 mg/dL (ref 26–217)

## 2019-10-20 LAB — FERRITIN: Ferritin: 96 ng/mL (ref 11–307)

## 2019-10-23 DIAGNOSIS — J40 Bronchitis, not specified as acute or chronic: Secondary | ICD-10-CM | POA: Diagnosis not present

## 2019-10-25 DIAGNOSIS — R42 Dizziness and giddiness: Secondary | ICD-10-CM | POA: Diagnosis not present

## 2019-10-25 DIAGNOSIS — H903 Sensorineural hearing loss, bilateral: Secondary | ICD-10-CM | POA: Diagnosis not present

## 2019-10-25 DIAGNOSIS — R0981 Nasal congestion: Secondary | ICD-10-CM | POA: Diagnosis not present

## 2019-10-25 DIAGNOSIS — H938X1 Other specified disorders of right ear: Secondary | ICD-10-CM | POA: Diagnosis not present

## 2019-10-25 DIAGNOSIS — R519 Headache, unspecified: Secondary | ICD-10-CM | POA: Diagnosis not present

## 2019-10-25 DIAGNOSIS — H9313 Tinnitus, bilateral: Secondary | ICD-10-CM | POA: Diagnosis not present

## 2019-10-26 NOTE — Telephone Encounter (Signed)
Per CMM: Request Reference Number: [id number]. EMGALITY INJ 120MG /ML is denied for not meeting the prior authorization requirement(s). For further questions, call [phone]. Appeals are not supported through [place]. Please refer to the fax case notice for appeals information and instructions.   Emgality is denied for not meeting the prior authorization requirement(s). Medication authorization requires the following: (1) You have a trial and failure (after a trial of at least two months), contraindication, or intolerance to one of the following preventative therapies from the list below: (i) Amitriptyline (Elavil); (ii) one of the following beta-blockers: Atenolol, metoprolol, nadolol, propranolol, or timolol; (iii) divalproex sodium (Depakote/Depakote ER); (iv) onabotulinumtoxinA (Botox) (for chronic migraine only); (v) venlafaxine (Effexor). Reviewed by: Fransisca Kaufmann.D. **Please note: Covered drug(s) may require prior authorization.  I called [facility], spoke with Jana Half and completed an urgent appeal request over the phone. Expect a determination within 72 hours. Call reference # [id number].  Expedited appeals intake dept phone # [phone].

## 2019-10-27 NOTE — Telephone Encounter (Signed)
Tami [name] with [facility] called to inform that the appeal has been approved.  [id number]

## 2019-10-30 NOTE — Telephone Encounter (Signed)
I called [facility] and spoke with staff. He was able to successfully run a claim for the [facility] and will fill this and contact the patient.

## 2019-10-31 ENCOUNTER — Other Ambulatory Visit: Payer: Self-pay

## 2019-10-31 ENCOUNTER — Telehealth: Payer: Self-pay | Admitting: Hematology & Oncology

## 2019-10-31 ENCOUNTER — Inpatient Hospital Stay (HOSPITAL_BASED_OUTPATIENT_CLINIC_OR_DEPARTMENT_OTHER): Payer: Medicare Other | Admitting: Hematology & Oncology

## 2019-10-31 ENCOUNTER — Inpatient Hospital Stay: Payer: Medicare Other

## 2019-10-31 ENCOUNTER — Encounter: Payer: Self-pay | Admitting: Hematology & Oncology

## 2019-10-31 VITALS — BP 148/85 | HR 91 | Temp 97.1°F | Resp 18 | Wt 192.0 lb

## 2019-10-31 DIAGNOSIS — C9000 Multiple myeloma not having achieved remission: Secondary | ICD-10-CM

## 2019-10-31 DIAGNOSIS — Z5112 Encounter for antineoplastic immunotherapy: Secondary | ICD-10-CM | POA: Diagnosis not present

## 2019-10-31 DIAGNOSIS — C9002 Multiple myeloma in relapse: Secondary | ICD-10-CM | POA: Diagnosis not present

## 2019-10-31 LAB — CBC WITH DIFFERENTIAL (CANCER CENTER ONLY)
Abs Immature Granulocytes: 0 10*3/uL (ref 0.00–0.07)
Basophils Absolute: 0 10*3/uL (ref 0.0–0.1)
Basophils Relative: 2 %
Eosinophils Absolute: 0 10*3/uL (ref 0.0–0.5)
Eosinophils Relative: 2 %
HCT: 38 % (ref 36.0–46.0)
Hemoglobin: 12.1 g/dL (ref 12.0–15.0)
Immature Granulocytes: 0 %
Lymphocytes Relative: 38 %
Lymphs Abs: 1 10*3/uL (ref 0.7–4.0)
MCH: 28.7 pg (ref 26.0–34.0)
MCHC: 31.8 g/dL (ref 30.0–36.0)
MCV: 90 fL (ref 80.0–100.0)
Monocytes Absolute: 0.4 10*3/uL (ref 0.1–1.0)
Monocytes Relative: 15 %
Neutro Abs: 1.1 10*3/uL — ABNORMAL LOW (ref 1.7–7.7)
Neutrophils Relative %: 43 %
Platelet Count: 117 10*3/uL — ABNORMAL LOW (ref 150–400)
RBC: 4.22 MIL/uL (ref 3.87–5.11)
RDW: 14 % (ref 11.5–15.5)
WBC Count: 2.6 10*3/uL — ABNORMAL LOW (ref 4.0–10.5)
nRBC: 0 % (ref 0.0–0.2)

## 2019-10-31 LAB — CMP (CANCER CENTER ONLY)
ALT: 15 U/L (ref 0–44)
AST: 23 U/L (ref 15–41)
Albumin: 3.7 g/dL (ref 3.5–5.0)
Alkaline Phosphatase: 39 U/L (ref 38–126)
Anion gap: 7 (ref 5–15)
BUN: 17 mg/dL (ref 8–23)
CO2: 28 mmol/L (ref 22–32)
Calcium: 9.2 mg/dL (ref 8.9–10.3)
Chloride: 105 mmol/L (ref 98–111)
Creatinine: 0.94 mg/dL (ref 0.44–1.00)
GFR, Est AFR Am: >60 mL/min (ref >60)
GFR, Estimated: >60 mL/min (ref >60)
Glucose, Bld: 113 mg/dL — ABNORMAL HIGH (ref 70–99)
Potassium: 4 mmol/L (ref 3.5–5.1)
Sodium: 140 mmol/L (ref 135–145)
Total Bilirubin: 0.4 mg/dL (ref 0.3–1.2)
Total Protein: 8.2 g/dL — ABNORMAL HIGH (ref 6.5–8.1)

## 2019-10-31 MED ORDER — MONTELUKAST SODIUM 10 MG PO TABS: 10.0000 mg | ORAL_TABLET | Freq: Every day | ORAL | 0 refills | Status: DC

## 2019-10-31 MED ORDER — BORTEZOMIB CHEMO SQ INJECTION 3.5 MG (2.5MG/ML)
1.3000 mg/m2 | Freq: Once | INTRAMUSCULAR | Status: DC
  Filled 2019-10-31: qty 1

## 2019-10-31 MED ORDER — PROCHLORPERAZINE MALEATE 10 MG PO TABS: 10.0000 mg | ORAL_TABLET | Freq: Once | ORAL | Status: DC

## 2019-10-31 MED ORDER — BORTEZOMIB CHEMO SQ INJECTION 3.5 MG (2.5MG/ML)
2.5000 mg | Freq: Once | INTRAMUSCULAR | Status: AC
  Administered 2019-10-31: 2.5 mg via SUBCUTANEOUS
  Filled 2019-10-31: qty 1

## 2019-10-31 MED ORDER — PROCHLORPERAZINE MALEATE 10 MG PO TABS
ORAL_TABLET | ORAL | Status: AC
  Filled 2019-10-31: qty 1

## 2019-10-31 NOTE — Telephone Encounter (Signed)
Called and advised patient of appointments added per 12/22 los

## 2019-10-31 NOTE — Patient Instructions (Signed)
Bortezomib injection What is this medicine? BORTEZOMIB (bor TEZ oh mib) is a medicine that targets proteins in cancer cells and stops the cancer cells from growing. It is used to treat multiple myeloma and mantle-cell lymphoma. This medicine may be used for other purposes; ask your health care provider or pharmacist if you have questions. COMMON BRAND NAME(S): Velcade What should I tell my health care provider before I take this medicine? They need to know if you have any of these conditions:  diabetes  heart disease  irregular heartbeat  liver disease  on hemodialysis  low blood counts, like low white blood cells, platelets, or hemoglobin  peripheral neuropathy  taking medicine for blood pressure  an unusual or allergic reaction to bortezomib, mannitol, boron, other medicines, foods, dyes, or preservatives  pregnant or trying to get pregnant  breast-feeding How should I use this medicine? This medicine is for injection into a vein or for injection under the skin. It is given by a health care professional in a hospital or clinic setting. Talk to your pediatrician regarding the use of this medicine in children. Special care may be needed. Overdosage: If you think you have taken too much of this medicine contact a poison control center or emergency room at once. NOTE: This medicine is only for you. Do not share this medicine with others. What if I miss a dose? It is important not to miss your dose. Call your doctor or health care professional if you are unable to keep an appointment. What may interact with this medicine? This medicine may interact with the following medications:  ketoconazole  rifampin  ritonavir  St. John's Wort This list may not describe all possible interactions. Give your health care provider a list of all the medicines, herbs, non-prescription drugs, or dietary supplements you use. Also tell them if you smoke, drink alcohol, or use illegal drugs. Some  items may interact with your medicine. What should I watch for while using this medicine? You may get drowsy or dizzy. Do not drive, use machinery, or do anything that needs mental alertness until you know how this medicine affects you. Do not stand or sit up quickly, especially if you are an older patient. This reduces the risk of dizzy or fainting spells. In some cases, you may be given additional medicines to help with side effects. Follow all directions for their use. Call your doctor or health care professional for advice if you get a fever, chills or sore throat, or other symptoms of a cold or flu. Do not treat yourself. This drug decreases your body's ability to fight infections. Try to avoid being around people who are sick. This medicine may increase your risk to bruise or bleed. Call your doctor or health care professional if you notice any unusual bleeding. You may need blood work done while you are taking this medicine. In some patients, this medicine may cause a serious brain infection that may cause death. If you have any problems seeing, thinking, speaking, walking, or standing, tell your doctor right away. If you cannot reach your doctor, urgently seek other source of medical care. Check with your doctor or health care professional if you get an attack of severe diarrhea, nausea and vomiting, or if you sweat a lot. The loss of too much body fluid can make it dangerous for you to take this medicine. Do not become pregnant while taking this medicine or for at least 7 months after stopping it. Women should inform their doctor   if they wish to become pregnant or think they might be pregnant. Men should not father a child while taking this medicine and for at least 4 months after stopping it. There is a potential for serious side effects to an unborn child. Talk to your health care professional or pharmacist for more information. Do not breast-feed an infant while taking this medicine or for 2  months after stopping it. This medicine may interfere with the ability to have a child. You should talk with your doctor or health care professional if you are concerned about your fertility. What side effects may I notice from receiving this medicine? Side effects that you should report to your doctor or health care professional as soon as possible:  allergic reactions like skin rash, itching or hives, swelling of the face, lips, or tongue  breathing problems  changes in hearing  changes in vision  fast, irregular heartbeat  feeling faint or lightheaded, falls  pain, tingling, numbness in the hands or feet  right upper belly pain  seizures  swelling of the ankles, feet, hands  unusual bleeding or bruising  unusually weak or tired  vomiting  yellowing of the eyes or skin Side effects that usually do not require medical attention (report to your doctor or health care professional if they continue or are bothersome):  changes in emotions or moods  constipation  diarrhea  loss of appetite  headache  irritation at site where injected  nausea This list may not describe all possible side effects. Call your doctor for medical advice about side effects. You may report side effects to FDA at 1-800-FDA-1088. Where should I keep my medicine? This drug is given in a hospital or clinic and will not be stored at home. NOTE: This sheet is a summary. It may not cover all possible information. If you have questions about this medicine, talk to your doctor, pharmacist, or health care provider.  2020 [employer] (2018-03-07 16:29:31)  

## 2019-10-31 NOTE — Progress Notes (Signed)
Hematology and Oncology Follow Up Visit  Cyrine Glandon [id number] [birth year] 63 y.o. 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 Balingit is here today for follow-up and treatment.  Unfortunately, it looks like we probably going to have to make a change in her treatment protocol.  Her M spike keeps going up slowly.  Her M spike couple weeks ago was 1.5 g/dL.  Because this, I really think we are going to have to make a change.  I think that her best option is going to be subcutaneous daratumumab.  She would like to try to avoid having an IV put in.  I do think that this would be a good idea.  I think that she would tolerate this.  I do feel bad that we have to do this weekly for a couple months and then start to bring the treatments out.  There is been no problems with nausea or vomiting.  She has had no issues.  She and her family are going to [place], [place] for a week.  They are going after Christmas.  I do not see a problem with her going down there.  She is on her aspirin.  I told her to make sure she drinks a lot of water on the trip down and back in while she is down there.  There is been no problems with her bowels or bladder.  She has had no nausea or vomiting.  I did tell her to stop the pomalidomide.  Overall, her performance status is ECOG 1.    Medications:  Allergies as of 10/31/2019      Reactions   Benazepril Anaphylaxis, Swelling   angioedema Throat and lip swelling   Ondansetron Hcl Hives   Redness and hives post IV admin on 07/05/17   Codeine Nausea And Vomiting   Morphine Hives   Redness and hives noted post IV admin on 07/05/17      Medication List       Accurate as of October 31, 2019  11:54 AM. If you have any questions, ask your nurse or doctor.        acetaminophen 325 MG tablet Commonly known as: TYLENOL Take 650 mg by mouth.   ACETAMINOPHEN-BUTALBITAL 50-325 MG Tabs Take 1 tablet by mouth 2 (two) times daily as needed.   albuterol 108 (90 Base) MCG/ACT inhaler Commonly known as: VENTOLIN HFA Inhale 2 puffs into the lungs every 6 (six) hours as needed for wheezing or shortness of breath.   ALPRAZolam 0.25 MG tablet Commonly known as: Xanax Take 1-2 tabs (0.25mg -0.50mg ) 30-60 minutes before procedure. May repeat if needed.Do not drive.   amLODipine 5 MG tablet Commonly known as: NORVASC 1 tab in am and 2 tabs po pm   amoxicillin 500 MG capsule Commonly known as: AMOXIL Take 2 capsules (1,000 mg total) by mouth 2 (two) times daily.   amoxicillin-clavulanate 875-125 MG tablet Commonly known as: AUGMENTIN Take 1 tablet by mouth 2 (two) times daily.   ARTIFICIAL TEARS OP Apply to eye.   aspirin EC 81 MG tablet Take 1 tablet (81 mg total) by mouth daily.   carvedilol 25 MG tablet Commonly known as: COREG Take 1 tablet (25 mg total) by mouth 2 (  two) times daily with a meal.   cyclobenzaprine 10 MG tablet Commonly known as: FLEXERIL Take 10 mg by mouth as needed.   Emgality 120 MG/ML Soaj Generic drug: Galcanezumab-gnlm Inject 120 mg into the skin every 30 (thirty) days.   famciclovir 250 MG tablet Commonly known as: FAMVIR Take 1 tablet (250 mg total) by mouth daily.   famotidine 40 MG/5ML suspension Commonly known as: PEPCID Take 5 mLs (40 mg total) by mouth at bedtime as needed for heartburn or indigestion.   fluticasone 50 MCG/ACT nasal spray Commonly known as: FLONASE Place 2 sprays into both nostrils daily. What changed:   when to take this  reasons to take this   furosemide 20 MG tablet Commonly known as: LASIX Take 1 tablet (20 mg total) by mouth daily as needed for fluid or edema.   HYDROcodone-acetaminophen 5-325 MG  tablet Commonly known as: NORCO/VICODIN Take 1 tablet by mouth every 4 (four) hours as needed for moderate pain or severe pain.   hyoscyamine 0.125 MG SL tablet Commonly known as: LEVSIN SL Place 1 tablet (0.125 mg total) under the tongue every 4 (four) hours as needed.   lidocaine 5 % Commonly known as: Lidoderm Place 1 patch onto the skin daily. Remove & Discard patch within 12 hours or as directed by MD   loratadine 10 MG tablet Commonly known as: CLARITIN Take 1 tablet (10 mg total) by mouth 2 (two) times daily as needed for allergies.   losartan 25 MG tablet Commonly known as: COZAAR Take by mouth.   metoCLOPramide 10 MG tablet Commonly known as: Reglan Take 1 tablet (10 mg total) by mouth 4 (four) times daily. For migraine or headache or nausea   MULTIVITAMIN ADULT PO Take by mouth.   Nurtec 75 MG Tbdp Generic drug: Rimegepant Sulfate Take 75 mg by mouth daily as needed. For migraines. Take as close to onset of migraine as possible. One daily maximum.   ondansetron 4 MG tablet Commonly known as: Zofran Take 1 tablet (4 mg total) by mouth every 8 (eight) hours as needed for nausea or vomiting.   pantoprazole 40 MG tablet Commonly known as: PROTONIX Take 1 tablet (40 mg total) by mouth 2 (two) times daily.   pomalidomide 2 MG capsule Commonly known as: POMALYST Take 1 capsule (2 mg total) by mouth daily. Every 21 days. Auth [id number]   potassium chloride SA 20 MEQ tablet Commonly known as: KLOR-CON Take 2 tablets (40 mEq total) by mouth once for 1 dose.   Premarin vaginal cream Generic drug: conjugated estrogens Place vaginally.   promethazine 25 MG tablet Commonly known as: PHENERGAN Take 1 tablet (25 mg total) by mouth every 8 (eight) hours as needed for nausea or vomiting.   rizatriptan 10 MG tablet Commonly known as: MAXALT SMARTSIG:1 Tablet(s) By Mouth As Needed   sucralfate 1 g tablet Commonly known as: Carafate Take 1 tablet (1 g total) by mouth  4 (four) times daily -  with meals and at bedtime.   SUMAtriptan 100 MG tablet Commonly known as: IMITREX 1 tab PO at headache onset, may repeat in 2 hrs as needed. No more than 2 tabs per day or 4 tabs per wk. (Can dispense #9 if quality limit)   topiramate 50 MG tablet Commonly known as: Topamax Take 1 tablet (50 mg total) by mouth 2 (two) times daily.   triamterene-hydrochlorothiazide 37.5-25 MG tablet Commonly known as: MAXZIDE-25 Take 1 tablet by mouth daily.  Allergies:  Allergies  Allergen Reactions  . Benazepril Anaphylaxis and Swelling    angioedema Throat and lip swelling  . Ondansetron Hcl Hives    Redness and hives post IV admin on 07/05/17  . Codeine Nausea And Vomiting  . Morphine Hives    Redness and hives noted post IV admin on 07/05/17    Past Medical History, Surgical history, Social history, and Family History were reviewed and updated.  Review of Systems: Review of Systems  Constitutional: Negative.   HENT: Negative.   Eyes: Negative.   Respiratory: Negative.   Cardiovascular: Negative.   Gastrointestinal: Negative.   Genitourinary: Negative.   Musculoskeletal: Negative.   Skin: Negative.   Neurological: Negative.   Endo/Heme/Allergies: Negative.   Psychiatric/Behavioral: Negative.       Physical Exam:  weight is 192 lb (87.1 kg). Her temporal temperature is 97.1 F (36.2 C) (abnormal). Her blood pressure is 148/85 (abnormal) and her pulse is 91. Her respiration is 18 and oxygen saturation is 98%.   Wt Readings from Last 3 Encounters:  10/31/19 192 lb (87.1 kg)  10/19/19 190 lb 6.4 oz (86.4 kg)  10/17/19 198 lb (89.8 kg)    Physical Exam Vitals reviewed.  HENT:     Head: Normocephalic and atraumatic.  Eyes:     Pupils: Pupils are equal, round, and reactive to light.  Cardiovascular:     Rate and Rhythm: Normal rate and regular rhythm.     Heart sounds: Normal heart sounds.  Pulmonary:     Effort: Pulmonary effort is normal.      Breath sounds: Normal breath sounds.  Abdominal:     General: Bowel sounds are normal.     Palpations: Abdomen is soft.  Musculoskeletal:        General: No tenderness or deformity. Normal range of motion.     Cervical back: Normal range of motion.  Lymphadenopathy:     Cervical: No cervical adenopathy.  Skin:    General: Skin is warm and dry.     Findings: No erythema or rash.  Neurological:     Mental Status: She is alert and oriented to person, place, and time.  Psychiatric:        Behavior: Behavior normal.        Thought Content: Thought content normal.        Judgment: Judgment normal.      Lab Results  Component Value Date   WBC 2.6 (L) 10/31/2019   HGB 12.1 10/31/2019   HCT 38.0 10/31/2019   MCV 90.0 10/31/2019   PLT 117 (L) 10/31/2019   Lab Results  Component Value Date   FERRITIN 96 10/19/2019   IRON 59 10/19/2019   TIBC 343 10/19/2019   UIBC 284 10/19/2019   IRONPCTSAT 17 (L) 10/19/2019   Lab Results  Component Value Date   RETICCTPCT 0.8 07/16/2016   RBC 4.22 10/31/2019   RETICCTABS 32,640 07/16/2016   Lab Results  Component Value Date   KPAFRELGTCHN 45.0 (H) 10/19/2019   LAMBDASER 11.8 10/19/2019   KAPLAMBRATIO 3.81 (H) 10/19/2019   Lab Results  Component Value Date   IGGSERUM 2,869 (H) 10/19/2019   IGA 232 10/19/2019   IGMSERUM 108 10/19/2019   Lab Results  Component Value Date   TOTALPROTELP 8.5 10/19/2019   ALBUMINELP 3.5 10/19/2019   A1GS 0.2 10/19/2019   A2GS 1.0 10/19/2019   BETS 1.2 10/19/2019   BETA2SER 0.5 07/16/2016   GAMS 2.5 (H) 10/19/2019   MSPIKE 1.5 (H) 10/19/2019  SPEI Comment 10/19/2019     Chemistry      Component Value Date/Time   NA 140 10/31/2019 1059   NA 143 11/08/2017 1127   NA 140 12/31/2016 1000   K 4.0 10/31/2019 1059   K 4.0 11/08/2017 1127   K 3.6 12/31/2016 1000   CL 105 10/31/2019 1059   CL 107 11/08/2017 1127   CO2 28 10/31/2019 1059   CO2 25 11/08/2017 1127   CO2 24 12/31/2016 1000    BUN 17 10/31/2019 1059   BUN 19 11/08/2017 1127   BUN 22.3 12/31/2016 1000   CREATININE 0.94 10/31/2019 1059   CREATININE 0.9 11/08/2017 1127   CREATININE 0.8 12/31/2016 1000      Component Value Date/Time   CALCIUM 9.2 10/31/2019 1059   CALCIUM 9.9 11/08/2017 1127   CALCIUM 9.5 12/31/2016 1000   ALKPHOS 39 10/31/2019 1059   ALKPHOS 44 11/08/2017 1127   ALKPHOS 51 12/31/2016 1000   AST 23 10/31/2019 1059   AST 28 12/31/2016 1000   ALT 15 10/31/2019 1059   ALT 36 11/08/2017 1127   ALT 16 12/31/2016 1000   BILITOT 0.4 10/31/2019 1059   BILITOT 0.46 12/31/2016 1000       Impression and Plan: Ms. Carrothers is a very pleasant 63 yo African American female with a previous IgG kappa MGUS with progression to myeloma.  We will have to make a change in her protocol.  Again, I think subcutaneous daratumumab would be a good idea for her.  I think we can probably use this as a single agent therapy.  If she, for some reason, does not respond to single agent daratumumab, then I would consider adding Kyprolis.  I see no problems with her going down to [place].  I think she will have a good time down there.    I will plan to get her back to see [place] the second week in January.  I would like to give her a couple weeks off just that she can enjoy the holidays and then when she gets back just to get back to her routine.   [name], MD 12/22/202011:54 AM

## 2019-10-31 NOTE — Progress Notes (Signed)
DISCONTINUE ON PATHWAY REGIMEN - Multiple Myeloma and Other Plasma Cell Dyscrasias     A cycle is every 21 days:     Bortezomib      Lenalidomide      Dexamethasone   **Always confirm dose/schedule in your pharmacy ordering system**  REASON: Disease Progression PRIOR TREATMENT: [id number]: VRd (Bortezomib 1.3 mg/m2 Subcut D1, 8, 15 + Lenalidomide 25 mg + Dexamethasone 40 mg) q21 Days x 8 Cycles TREATMENT RESPONSE: Partial Response (PR)  START ON PATHWAY REGIMEN - Multiple Myeloma and Other Plasma Cell Dyscrasias     Cycles 1 and 2: A cycle is every 28 days:     Daratumumab and hyaluronidase-fihj    Cycles 3 through 6: A cycle is every 28 days:     Daratumumab and hyaluronidase-fihj    Cycles 7 and beyond: A cycle is every 28 days:     Daratumumab and hyaluronidase-fihj   **Always confirm dose/schedule in your pharmacy ordering system**  Patient Characteristics: Relapsed / Refractory, Second through Fourth Lines of Therapy R-ISS Staging: III Disease Classification: Relapsed Line of Therapy: Second Line Intent of Therapy: Non-Curative / Palliative Intent, Discussed with Patient

## 2019-10-31 NOTE — Progress Notes (Signed)
ANC 1.1.  Ok to treat per Dr Ennever.   

## 2019-11-01 ENCOUNTER — Other Ambulatory Visit: Payer: Self-pay | Admitting: *Deleted

## 2019-11-01 LAB — TYPE AND SCREEN
ABO/RH(D): A POS
Antibody Screen: NEGATIVE

## 2019-11-01 LAB — PRETREATMENT RBC PHENOTYPE

## 2019-11-03 DIAGNOSIS — Z95810 Presence of automatic (implantable) cardiac defibrillator: Secondary | ICD-10-CM | POA: Diagnosis not present

## 2019-11-08 DIAGNOSIS — G4733 Obstructive sleep apnea (adult) (pediatric): Secondary | ICD-10-CM | POA: Diagnosis not present

## 2019-11-13 ENCOUNTER — Other Ambulatory Visit: Payer: Self-pay

## 2019-11-13 ENCOUNTER — Encounter: Payer: Self-pay | Admitting: Family Medicine

## 2019-11-13 ENCOUNTER — Ambulatory Visit (INDEPENDENT_AMBULATORY_CARE_PROVIDER_SITE_OTHER): Payer: Medicare Other | Admitting: Family Medicine

## 2019-11-13 VITALS — BP 152/88 | HR 75 | Temp 98.7°F | Resp 18 | Wt 194.6 lb

## 2019-11-13 DIAGNOSIS — M25561 Pain in right knee: Secondary | ICD-10-CM

## 2019-11-13 DIAGNOSIS — G629 Polyneuropathy, unspecified: Secondary | ICD-10-CM

## 2019-11-13 DIAGNOSIS — G8929 Other chronic pain: Secondary | ICD-10-CM

## 2019-11-13 DIAGNOSIS — M1990 Unspecified osteoarthritis, unspecified site: Secondary | ICD-10-CM | POA: Diagnosis not present

## 2019-11-13 DIAGNOSIS — I509 Heart failure, unspecified: Secondary | ICD-10-CM | POA: Diagnosis not present

## 2019-11-13 DIAGNOSIS — M25562 Pain in left knee: Secondary | ICD-10-CM

## 2019-11-13 DIAGNOSIS — E785 Hyperlipidemia, unspecified: Secondary | ICD-10-CM | POA: Diagnosis not present

## 2019-11-13 DIAGNOSIS — K2100 Gastro-esophageal reflux disease with esophagitis, without bleeding: Secondary | ICD-10-CM

## 2019-11-13 DIAGNOSIS — C9000 Multiple myeloma not having achieved remission: Secondary | ICD-10-CM

## 2019-11-13 DIAGNOSIS — I10 Essential (primary) hypertension: Secondary | ICD-10-CM

## 2019-11-13 DIAGNOSIS — R519 Headache, unspecified: Secondary | ICD-10-CM

## 2019-11-13 MED ORDER — AMLODIPINE BESYLATE 10 MG PO TABS: 10.0000 mg | ORAL_TABLET | Freq: Two times a day (BID) | ORAL | 3 refills | Status: DC

## 2019-11-13 MED ORDER — FAMOTIDINE 40 MG PO TABS: 40.0000 mg | ORAL_TABLET | Freq: Every day | ORAL | 5 refills | Status: DC

## 2019-11-13 NOTE — Patient Instructions (Addendum)
Multiviamin with minerals daily Vitamin D 11-1998 IU daily Enteric coated Aspirin 81 mg daily  Melatonin 1-5 mg at bedtime   Pulse oximeter want oxygen in 90s Check vitals weekly    Hypertension, Adult High blood pressure (hypertension) is when the force of blood pumping through the arteries is too strong. The arteries are the blood vessels that carry blood from the heart throughout the body. Hypertension forces the heart to work harder to pump blood and may cause arteries to become narrow or stiff. Untreated or uncontrolled hypertension can cause a heart attack, heart failure, a stroke, kidney disease, and other problems. A blood pressure reading consists of a higher number over a lower number. Ideally, your blood pressure should be below 120/80. The first ("top") number is called the systolic pressure. It is a measure of the pressure in your arteries as your heart beats. The second ("bottom") number is called the diastolic pressure. It is a measure of the pressure in your arteries as the heart relaxes. What are the causes? The exact cause of this condition is not known. There are some conditions that result in or are related to high blood pressure. What increases the risk? Some risk factors for high blood pressure are under your control. The following factors may make you more likely to develop this condition:  Smoking.  Having type 2 diabetes mellitus, high cholesterol, or both.  Not getting enough exercise or physical activity.  Being overweight.  Having too much fat, sugar, calories, or salt (sodium) in your diet.  Drinking too much alcohol. Some risk factors for high blood pressure may be difficult or impossible to change. Some of these factors include:  Having chronic kidney disease.  Having a family history of high blood pressure.  Age. Risk increases with age.  Race. You may be at higher risk if you are African American.  Gender. Men are at higher risk than women before  age 21. After age 74, women are at higher risk than men.  Having obstructive sleep apnea.  Stress. What are the signs or symptoms? High blood pressure may not cause symptoms. Very high blood pressure (hypertensive crisis) may cause:  Headache.  Anxiety.  Shortness of breath.  Nosebleed.  Nausea and vomiting.  Vision changes.  Severe chest pain.  Seizures. How is this diagnosed? This condition is diagnosed by measuring your blood pressure while you are seated, with your arm resting on a flat surface, your legs uncrossed, and your feet flat on the floor. The cuff of the blood pressure monitor will be placed directly against the skin of your upper arm at the level of your heart. It should be measured at least twice using the same arm. Certain conditions can cause a difference in blood pressure between your right and left arms. Certain factors can cause blood pressure readings to be lower or higher than normal for a short period of time:  When your blood pressure is higher when you are in a health care provider's office than when you are at home, this is called white coat hypertension. Most people with this condition do not need medicines.  When your blood pressure is higher at home than when you are in a health care provider's office, this is called masked hypertension. Most people with this condition may need medicines to control blood pressure. If you have a high blood pressure reading during one visit or you have normal blood pressure with other risk factors, you may be asked to:  Return  on a different day to have your blood pressure checked again.  Monitor your blood pressure at home for 1 week or longer. If you are diagnosed with hypertension, you may have other blood or imaging tests to help your health care provider understand your overall risk for other conditions. How is this treated? This condition is treated by making healthy lifestyle changes, such as eating healthy  foods, exercising more, and reducing your alcohol intake. Your health care provider may prescribe medicine if lifestyle changes are not enough to get your blood pressure under control, and if:  Your systolic blood pressure is above 130.  Your diastolic blood pressure is above 80. Your personal target blood pressure may vary depending on your medical conditions, your age, and other factors. Follow these instructions at home: Eating and drinking   Eat a diet that is high in fiber and potassium, and low in sodium, added sugar, and fat. An example eating plan is called the DASH (Dietary Approaches to Stop Hypertension) diet. To eat this way: ? Eat plenty of fresh fruits and vegetables. Try to fill one half of your plate at each meal with fruits and vegetables. ? Eat whole grains, such as whole-wheat pasta, brown rice, or whole-grain bread. Fill about one fourth of your plate with whole grains. ? Eat or drink low-fat dairy products, such as skim milk or low-fat yogurt. ? Avoid fatty cuts of meat, processed or cured meats, and poultry with skin. Fill about one fourth of your plate with lean proteins, such as fish, chicken without skin, beans, eggs, or tofu. ? Avoid pre-made and processed foods. These tend to be higher in sodium, added sugar, and fat.  Reduce your daily sodium intake. Most people with hypertension should eat less than 1,500 mg of sodium a day.  Do not drink alcohol if: ? Your health care provider tells you not to drink. ? You are pregnant, may be pregnant, or are planning to become pregnant.  If you drink alcohol: ? Limit how much you use to:  0-1 drink a day for women.  0-2 drinks a day for men. ? Be aware of how much alcohol is in your drink. In the [place] one drink equals one 12 oz bottle of beer (355 mL), one 5 oz glass of wine (148 mL), or one 1 oz glass of hard liquor (44 mL). Lifestyle   Work with your health care provider to maintain a healthy body weight or to lose  weight. Ask what an ideal weight is for you.  Get at least 30 minutes of exercise most days of the week. Activities may include walking, swimming, or biking.  Include exercise to strengthen your muscles (resistance exercise), such as Pilates or lifting weights, as part of your weekly exercise routine. Try to do these types of exercises for 30 minutes at least 3 days a week.  Do not use any products that contain nicotine or tobacco, such as cigarettes, e-cigarettes, and chewing tobacco. If you need help quitting, ask your health care provider.  Monitor your blood pressure at home as told by your health care provider.  Keep all follow-up visits as told by your health care provider. This is important. Medicines  Take over-the-counter and prescription medicines only as told by your health care provider. Follow directions carefully. Blood pressure medicines must be taken as prescribed.  Do not skip doses of blood pressure medicine. Doing this puts you at risk for problems and can make the medicine less effective.  Ask your health care provider about side effects or reactions to medicines that you should watch for. Contact a health care provider if you:  Think you are having a reaction to a medicine you are taking.  Have headaches that keep coming back (recurring).  Feel dizzy.  Have swelling in your ankles.  Have trouble with your vision. Get help right away if you:  Develop a severe headache or confusion.  Have unusual weakness or numbness.  Feel faint.  Have severe pain in your chest or abdomen.  Vomit repeatedly.  Have trouble breathing. Summary  Hypertension is when the force of blood pumping through your arteries is too strong. If this condition is not controlled, it may put you at risk for serious complications.  Your personal target blood pressure may vary depending on your medical conditions, your age, and other factors. For most people, a normal blood pressure is  less than 120/80.  Hypertension is treated with lifestyle changes, medicines, or a combination of both. Lifestyle changes include losing weight, eating a healthy, low-sodium diet, exercising more, and limiting alcohol. This information is not intended to replace advice given to you by your health care provider. Make sure you discuss any questions you have with your health care provider. Document Revised: 07/06/2018 Document Reviewed: 07/06/2018 Elsevier Patient Education  2020 [employer].

## 2019-11-14 ENCOUNTER — Ambulatory Visit (HOSPITAL_BASED_OUTPATIENT_CLINIC_OR_DEPARTMENT_OTHER)
Admission: RE | Admit: 2019-11-14 | Discharge: 2019-11-14 | Disposition: A | Discharge status: Home / Self Care | Payer: Medicare Other | Source: Ambulatory Visit | Attending: Family Medicine | Admitting: Family Medicine

## 2019-11-14 DIAGNOSIS — G8929 Other chronic pain: Secondary | ICD-10-CM | POA: Insufficient documentation

## 2019-11-14 DIAGNOSIS — M1711 Unilateral primary osteoarthritis, right knee: Secondary | ICD-10-CM | POA: Diagnosis not present

## 2019-11-14 DIAGNOSIS — M25561 Pain in right knee: Secondary | ICD-10-CM | POA: Diagnosis not present

## 2019-11-14 DIAGNOSIS — M25562 Pain in left knee: Secondary | ICD-10-CM | POA: Diagnosis present

## 2019-11-14 DIAGNOSIS — M1712 Unilateral primary osteoarthritis, left knee: Secondary | ICD-10-CM | POA: Diagnosis not present

## 2019-11-15 DIAGNOSIS — G629 Polyneuropathy, unspecified: Secondary | ICD-10-CM | POA: Insufficient documentation

## 2019-11-15 NOTE — Assessment & Plan Note (Signed)
Notes numbness in both feet. Long standing and stable, not painful. Likely multifactorial and related to multiple comorbidities and previous oncologic treatments. No initiation of therapy today

## 2019-11-15 NOTE — Assessment & Plan Note (Signed)
Improved some on recheck but still elevated. Will increase Amlodipine to 10 mg po bid and reassess.

## 2019-11-15 NOTE — Assessment & Plan Note (Signed)
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 For now stay as active as able use topical creams and tylenol prn.

## 2019-11-15 NOTE — Assessment & Plan Note (Signed)
Encouraged heart healthy diet, increase exercise, avoid trans fats, consider a krill oil cap daily 

## 2019-11-15 NOTE — Assessment & Plan Note (Signed)
Her disease state is progressing so they are working on changing her therapy so she is anxious about this but working with oncology regarding the changes.

## 2019-11-15 NOTE — Assessment & Plan Note (Signed)
Emgality 120 mg/ml given to patient today. It has been prescribed by neurology and patient is finding it helpful but she does not like administering them herself

## 2019-11-15 NOTE — Assessment & Plan Note (Signed)
No recent exacerbation. Follows with cardiology regularly

## 2019-11-15 NOTE — Progress Notes (Signed)
Subjective:    [name], female    DOB: 1956/04/04, 64 y.o.   MRN: [id number]  No chief complaint on file.   HPI Patient is in today for follow up on chronic medical concerns including multiple myeloma, hypertension, reflux and more. No recent febrile illness or hospitalizations. She is noting an improvement in her headaches after starting Emgality prescribed by neurology. Denies CP/palp/SOB/HA/congestion/fevers/GI or GU c/o. Taking meds as prescribed. She is struggling with worsening stiffness and pain in bilateral knees. No falls or trauma.   Past Medical History:  Diagnosis Date  . Abnormal SPEP 07/26/2016  . Arthritis    knees, hands  . Back pain 07/14/2016  . Bowel obstruction ([place]) 11/2014  . C. difficile diarrhea   . CHF (congestive heart failure) ([place])   . Colitis   . Congestive heart failure ([place]) 02/24/2015   S/p pacemaker  . Depression   . Elevated sed rate 08/15/2017  . Epigastric pain 03/22/2017  . GERD (gastroesophageal reflux disease)   . Heart disease   . Hyperlipidemia   . Hypertension   . 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 Low back pain 07/14/2016  . 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 Multiple myeloma ([place]) 07/20/2018  . Multiple myeloma not having achieved remission ([place]) 07/20/2018  . Myalgia 12/31/2016  . Obstructive sleep apnea 03/31/2015  . SOB (shortness of breath) 04/09/2016  . Stroke ([place])    TIAs  . UTI (urinary tract infection)     Past Surgical History:  Procedure Laterality Date  . ABDOMINAL HYSTERECTOMY     menorraghia, 2006, total  . CHOLECYSTECTOMY    . COLONOSCOPY  2018   [facility] per patient  . ESOPHAGOGASTRODUODENOSCOPY  2018   [facility]   . KNEE SURGERY     right, repair torn torn cartialage  . PACEMAKER INSERTION  08/2014  . TONSILLECTOMY    . TUBAL LIGATION      Family History  Problem Relation Age of Onset  . Diabetes Mother   . Hypertension Mother    . Heart disease Mother        s/p 1 stent  . Hyperlipidemia Mother   . Arthritis Mother   . Cancer Father        COLON  . Colon cancer Father 57  . Irritable bowel syndrome Sister   . Hyperlipidemia Daughter   . Hypertension Daughter   . Hypertension Maternal Grandmother   . Arthritis Maternal Grandmother   . Heart disease Maternal Grandfather        MI  . Hypertension Maternal Grandfather   . Arthritis Maternal Grandfather   . Hypertension Son   . Esophageal cancer Neg Hx     Social History   Socioeconomic History  . Marital status: Divorced    Spouse name: [name]  . Number of children: 3  . Years of education: [name]  . Highest education level: High school graduate  Occupational History  . [name]  Tobacco Use  . Smoking status: Never Smoker  . Smokeless tobacco: Never Used  Substance and Sexual Activity  . Alcohol use: No  . Drug use: No  . Sexual activity: [name]  Other Topics Concern  . [name]  Social History Narrative   Lives at home alone   Retired   Caffeine: coffee   Social Determinants of Health   Financial Resource Strain:   . Difficulty of Paying Living Expenses: Not  on file  Food Insecurity:   . Worried About Charity fundraiser in the Last Year: [name]  . Ran Out of Food in the Last Year: [name]  Transportation Needs:   . Lack of Transportation (Medical): [name]  . Lack of Transportation (Non-Medical): [name]  Physical Activity:   . Days of Exercise per Week: [name]  . Minutes of Exercise per Session: [name]  Stress:   . Feeling of Stress : [name]  Social Connections:   . Frequency of Communication with Friends and Family: [name]  . Frequency of Social Gatherings with Friends and Family: [name]  . Attends Religious Services: [name]  . Active Member of Clubs or Organizations: [name]  . Attends Archivist Meetings: [name]  . Marital Status: Not  on file  Intimate Partner Violence:   . Fear of Current or Ex-Partner: [name]  . Emotionally Abused: [name]  . Physically Abused: [name]  . Sexually Abused: [name]    Outpatient Medications Prior to Visit  Medication Sig Dispense Refill  . acetaminophen (TYLENOL) 325 MG tablet Take 650 mg by mouth.    . ACETAMINOPHEN-BUTALBITAL 50-325 MG TABS Take 1 tablet by mouth 2 (two) times daily as needed. 120 tablet 1  . albuterol (PROVENTIL HFA;VENTOLIN HFA) 108 (90 Base) MCG/ACT inhaler Inhale 2 puffs into the lungs every 6 (six) hours as needed for wheezing or shortness of breath. 1 Inhaler 2  . ALPRAZolam (XANAX) 0.25 MG tablet Take 1-2 tabs (0.84m-0.50mg) 30-60 minutes before procedure. May repeat if needed.Do not drive. 4 tablet 0  . amLODipine (NORVASC) 5 MG tablet 1 tab in am and 2 tabs po pm 90 tablet 1  . aspirin EC 81 MG tablet Take 1 tablet (81 mg total) by mouth daily. 90 tablet 1  . Carboxymethylcellulose Sodium (ARTIFICIAL TEARS OP) Apply to eye.    . carvedilol (COREG) 25 MG tablet Take 1 tablet (25 mg total) by mouth 2 (two) times daily with a meal. 180 tablet 1  . conjugated estrogens (PREMARIN) vaginal cream Place vaginally.    . cyclobenzaprine (FLEXERIL) 10 MG tablet Take 10 mg by mouth as needed.     . famciclovir (FAMVIR) 250 MG tablet Take 1 tablet (250 mg total) by mouth daily. 30 tablet 12  . fluticasone (FLONASE) 50 MCG/ACT nasal spray Place 2 sprays into both nostrils daily. (Patient taking differently: Place 2 sprays into both nostrils as needed. ) 16 g 1  . furosemide (LASIX) 20 MG tablet Take 1 tablet (20 mg total) by mouth daily as needed for fluid or edema. 30 tablet 2  . Galcanezumab-gnlm (EMGALITY) 120 MG/ML SOAJ Inject 120 mg into the skin every 30 (thirty) days. (Patient not taking: Reported on 10/31/2019) 1 pen 11  . HYDROcodone-acetaminophen (NORCO/VICODIN) 5-325 MG tablet Take 1 tablet by mouth every 4 (four) hours as needed for moderate pain  or severe pain. 20 tablet 0  . hyoscyamine (LEVSIN SL) 0.125 MG SL tablet Place 1 tablet (0.125 mg total) under the tongue every 4 (four) hours as needed. 30 tablet 2  . lidocaine (LIDODERM) 5 % Place 1 patch onto the skin daily. Remove & Discard patch within 12 hours or as directed by MD 30 patch 2  . loratadine (CLARITIN) 10 MG tablet Take 1 tablet (10 mg total) by mouth 2 (two)  times daily as needed for allergies. 30 tablet 11  . metoCLOPramide (REGLAN) 10 MG tablet Take 1 tablet (10 mg total) by mouth 4 (four) times daily. For migraine or headache or nausea 30 tablet 6  . montelukast (SINGULAIR) 10 MG tablet Take 1 tablet (10 mg total) by mouth at bedtime. Start taking Singulair on 11/16/2019. 20 tablet 0  . Multiple Vitamins-Minerals (MULTIVITAMIN ADULT PO) Take by mouth.    . ondansetron (ZOFRAN) 4 MG tablet Take 1 tablet (4 mg total) by mouth every 8 (eight) hours as needed for nausea or vomiting. 30 tablet 1  . pantoprazole (PROTONIX) 40 MG tablet Take 1 tablet (40 mg total) by mouth 2 (two) times daily. 180 tablet 1  . pomalidomide (POMALYST) 2 MG capsule Take 1 capsule (2 mg total) by mouth daily. Every 21 days. Auth #[id number] 21 capsule 0  . potassium chloride SA (K-DUR) 20 MEQ tablet Take 2 tablets (40 mEq total) by mouth once for 1 dose. 60 tablet 3  . promethazine (PHENERGAN) 25 MG tablet Take 1 tablet (25 mg total) by mouth every 8 (eight) hours as needed for nausea or vomiting. 30 tablet 2  . Rimegepant Sulfate (NURTEC) 75 MG TBDP Take 75 mg by mouth daily as needed. For migraines. Take as close to onset of migraine as possible. One daily maximum. 10 tablet 6  . sucralfate (CARAFATE) 1 g tablet Take 1 tablet (1 g total) by mouth 4 (four) times daily -  with meals and at bedtime. 120 tablet 0  . SUMAtriptan (IMITREX) 100 MG tablet 1 tab PO at headache onset, may repeat in 2 hrs as needed. No more than 2 tabs per day or 4 tabs per wk. (Can dispense #9 if quality limit)    . topiramate  (TOPAMAX) 50 MG tablet Take 1 tablet (50 mg total) by mouth 2 (two) times daily. 60 tablet 3  . triamterene-hydrochlorothiazide (MAXZIDE-25) 37.5-25 MG tablet Take 1 tablet by mouth daily.    Marland Kitchen amoxicillin (AMOXIL) 500 MG capsule Take 2 capsules (1,000 mg total) by mouth 2 (two) times daily. 40 capsule 0  . amoxicillin-clavulanate (AUGMENTIN) 875-125 MG tablet Take 1 tablet by mouth 2 (two) times daily. 20 tablet 0  . famotidine (PEPCID) 40 MG/5ML suspension Take 5 mLs (40 mg total) by mouth at bedtime as needed for heartburn or indigestion. 150 mL 2  . losartan (COZAAR) 25 MG tablet Take by mouth.    . rizatriptan (MAXALT) 10 MG tablet SMARTSIG:1 Tablet(s) By Mouth As Needed     No facility-administered medications prior to visit.    Allergies  Allergen Reactions  . Benazepril Anaphylaxis and Swelling    angioedema Throat and lip swelling  . Ondansetron Hcl Hives    Redness and hives post IV admin on 07/05/17  . Codeine Nausea And Vomiting  . Morphine Hives    Redness and hives noted post IV admin on 07/05/17    Review of Systems  Constitutional: Negative for fever and malaise/fatigue.  HENT: Negative for congestion.   Eyes: Negative for blurred vision.  Respiratory: Negative for shortness of breath.   Cardiovascular: Negative for chest pain, palpitations and leg swelling.  Gastrointestinal: Negative for abdominal pain, blood in stool and nausea.  Genitourinary: Negative for dysuria and frequency.  Musculoskeletal: Positive for joint pain. Negative for falls.  Skin: Negative for rash.  Neurological: Positive for headaches. Negative for dizziness and loss of consciousness.  Endo/Heme/Allergies: Negative for environmental allergies.  Psychiatric/Behavioral: Negative for depression. The  patient is not nervous/anxious.        Objective:    Physical Exam  BP (!) 152/88   Pulse 75   Temp 98.7 F (37.1 C) (Temporal)   Resp 18   Wt 194 lb 9.6 oz (88.3 kg)   SpO2 99%   BMI  30.48 kg/m  Wt Readings from Last 3 Encounters:  11/13/19 194 lb 9.6 oz (88.3 kg)  10/31/19 192 lb (87.1 kg)  10/19/19 190 lb 6.4 oz (86.4 kg)    Diabetic Foot Exam - Simple   No data filed     Lab Results  Component Value Date   WBC 2.6 (L) 10/31/2019   HGB 12.1 10/31/2019   HCT 38.0 10/31/2019   PLT 117 (L) 10/31/2019   GLUCOSE 113 (H) 10/31/2019   CHOL 200 07/13/2019   TRIG 146.0 07/13/2019   HDL 66.00 07/13/2019   LDLCALC 104 (H) 07/13/2019   ALT 15 10/31/2019   AST 23 10/31/2019   NA 140 10/31/2019   K 4.0 10/31/2019   CL 105 10/31/2019   CREATININE 0.94 10/31/2019   BUN 17 10/31/2019   CO2 28 10/31/2019   TSH 0.93 07/13/2019   INR 0.90 07/28/2018   HGBA1C 5.5 08/30/2019    Lab Results  Component Value Date   TSH 0.93 07/13/2019   Lab Results  Component Value Date   WBC 2.6 (L) 10/31/2019   HGB 12.1 10/31/2019   HCT 38.0 10/31/2019   MCV 90.0 10/31/2019   PLT 117 (L) 10/31/2019   Lab Results  Component Value Date   NA 140 10/31/2019   K 4.0 10/31/2019   CHLORIDE 108 12/31/2016   CO2 28 10/31/2019   GLUCOSE 113 (H) 10/31/2019   BUN 17 10/31/2019   CREATININE 0.94 10/31/2019   BILITOT 0.4 10/31/2019   ALKPHOS 39 10/31/2019   AST 23 10/31/2019   ALT 15 10/31/2019   PROT 8.2 (H) 10/31/2019   ALBUMIN 3.7 10/31/2019   CALCIUM 9.2 10/31/2019   ANIONGAP 7 10/31/2019   EGFR >90 12/31/2016   GFR 76.01 07/13/2019   Lab Results  Component Value Date   CHOL 200 07/13/2019   Lab Results  Component Value Date   HDL 66.00 07/13/2019   Lab Results  Component Value Date   LDLCALC 104 (H) 07/13/2019   Lab Results  Component Value Date   TRIG 146.0 07/13/2019   Lab Results  Component Value Date   CHOLHDL 3 07/13/2019   Lab Results  Component Value Date   HGBA1C 5.5 08/30/2019       Assessment & Plan:   Problem List Items Addressed This Visit    Multiple myeloma not having achieved remission ([place]) (Chronic)    Her disease state is  progressing so they are working on changing her therapy so she is anxious about this but working with oncology regarding the changes.       GERD (gastroesophageal reflux disease)    Avoid offending foods, start probiotics. Do not eat large meals in late evening and consider raising head of bed. rx for Famotidine      Relevant Medications   famotidine (PEPCID) 40 MG tablet   Arthritis    Bilateral knees are worsening and starting to limit her activity, xrays confirm moderate to severe arthritis and she has agreed to referral to orthopaedics so this is placed. For now stay as active as able use topical creams and tylenol prn.       Hyperlipidemia    Encouraged  heart healthy diet, increase exercise, avoid trans fats, consider a krill oil cap daily      Relevant Medications   amLODipine (NORVASC) 10 MG tablet   Hypertension    Improved some on recheck but still elevated. Will increase Amlodipine to 10 mg po bid and reassess.       Relevant Medications   amLODipine (NORVASC) 10 MG tablet   Congestive heart failure (H[facility])    No recent exacerbation. Follows with cardiology regularly      Relevant Medications   amLODipine (NORVASC) 10 MG tablet   Headache    Emgality 120 mg/ml given to patient today. It has been prescribed by neurology and patient is finding it helpful but she does not like administering them herself      Relevant Medications   amLODipine (NORVASC) 10 MG tablet   Peripheral neuropathy    Notes numbness in both feet. Long standing and stable, not painful. Likely multifactorial and related to multiple comorbidities and previous oncologic treatments. No initiation of therapy today       Other Visit Diagnoses    Chronic pain of both knees    -  Primary   Relevant Orders   DG Knee Complete 4 Views Left (Completed)   DG Knee Complete 4 Views Right (Completed)   Ambulatory referral to Orthopedic Surgery      I have discontinued Lannette Donath famotidine,  amoxicillin-clavulanate, rizatriptan, losartan, and amoxicillin. I am also having her start on amLODipine and famotidine. Additionally, I am having her maintain her conjugated estrogens, acetaminophen, pantoprazole, aspirin EC, ondansetron, lidocaine, sucralfate, famciclovir, hyoscyamine, albuterol, cyclobenzaprine, Multiple Vitamins-Minerals (MULTIVITAMIN ADULT PO), HYDROcodone-acetaminophen, carvedilol, topiramate, fluticasone, potassium chloride SA, ACETAMINOPHEN-BUTALBITAL, furosemide, Carboxymethylcellulose Sodium (ARTIFICIAL TEARS OP), metoCLOPramide, ALPRAZolam, loratadine, promethazine, amLODipine, triamterene-hydrochlorothiazide, SUMAtriptan, pomalidomide, Emgality, Nurtec, and montelukast.  Meds ordered this encounter  Medications  . amLODipine (NORVASC) 10 MG tablet    Sig: Take 1 tablet (10 mg total) by mouth 2 (two) times daily.    Dispense:  60 tablet    Refill:  3  . famotidine (PEPCID) 40 MG tablet    Sig: Take 1 tablet (40 mg total) by mouth at bedtime.    Dispense:  30 tablet    Refill:  5     Penni Homans, MD

## 2019-11-15 NOTE — Assessment & Plan Note (Signed)
Avoid offending foods, start probiotics. Do not eat large meals in late evening and consider raising head of bed. rx for Famotidine

## 2019-11-16 ENCOUNTER — Other Ambulatory Visit: Payer: Self-pay | Admitting: *Deleted

## 2019-11-16 MED ORDER — POMALIDOMIDE 2 MG PO CAPS: 2.0000 mg | ORAL_CAPSULE | Freq: Every day | ORAL | 0 refills | Status: DC

## 2019-11-21 ENCOUNTER — Telehealth: Payer: Self-pay | Admitting: Hematology & Oncology

## 2019-11-21 ENCOUNTER — Inpatient Hospital Stay: Payer: Medicare Other | Attending: Hematology & Oncology

## 2019-11-21 ENCOUNTER — Inpatient Hospital Stay (HOSPITAL_BASED_OUTPATIENT_CLINIC_OR_DEPARTMENT_OTHER): Payer: Medicare Other | Admitting: Family

## 2019-11-21 ENCOUNTER — Encounter: Payer: Self-pay | Admitting: Family

## 2019-11-21 ENCOUNTER — Inpatient Hospital Stay: Payer: Medicare Other

## 2019-11-21 ENCOUNTER — Other Ambulatory Visit: Payer: Self-pay

## 2019-11-21 VITALS — BP 149/67 | HR 83 | Temp 97.1°F | Resp 18 | Wt 195.8 lb

## 2019-11-21 DIAGNOSIS — C9000 Multiple myeloma not having achieved remission: Secondary | ICD-10-CM

## 2019-11-21 DIAGNOSIS — D509 Iron deficiency anemia, unspecified: Secondary | ICD-10-CM

## 2019-11-21 DIAGNOSIS — R519 Headache, unspecified: Secondary | ICD-10-CM | POA: Diagnosis not present

## 2019-11-21 DIAGNOSIS — Z5112 Encounter for antineoplastic immunotherapy: Secondary | ICD-10-CM | POA: Insufficient documentation

## 2019-11-21 DIAGNOSIS — D801 Nonfamilial hypogammaglobulinemia: Secondary | ICD-10-CM

## 2019-11-21 DIAGNOSIS — C9002 Multiple myeloma in relapse: Secondary | ICD-10-CM

## 2019-11-21 LAB — CBC WITH DIFFERENTIAL (CANCER CENTER ONLY)
Abs Immature Granulocytes: 0 10*3/uL (ref 0.00–0.07)
Basophils Absolute: 0 10*3/uL (ref 0.0–0.1)
Basophils Relative: 1 %
Eosinophils Absolute: 0.1 10*3/uL (ref 0.0–0.5)
Eosinophils Relative: 3 %
HCT: 36.3 % (ref 36.0–46.0)
Hemoglobin: 11.6 g/dL — ABNORMAL LOW (ref 12.0–15.0)
Immature Granulocytes: 0 %
Lymphocytes Relative: 33 %
Lymphs Abs: 0.9 10*3/uL (ref 0.7–4.0)
MCH: 28.8 pg (ref 26.0–34.0)
MCHC: 32 g/dL (ref 30.0–36.0)
MCV: 90.1 fL (ref 80.0–100.0)
Monocytes Absolute: 0.2 10*3/uL (ref 0.1–1.0)
Monocytes Relative: 8 %
Neutro Abs: 1.5 10*3/uL — ABNORMAL LOW (ref 1.7–7.7)
Neutrophils Relative %: 55 %
Platelet Count: 124 10*3/uL — ABNORMAL LOW (ref 150–400)
RBC: 4.03 MIL/uL (ref 3.87–5.11)
RDW: 14.2 % (ref 11.5–15.5)
WBC Count: 2.7 10*3/uL — ABNORMAL LOW (ref 4.0–10.5)
nRBC: 0 % (ref 0.0–0.2)

## 2019-11-21 LAB — CMP (CANCER CENTER ONLY)
ALT: 14 U/L (ref 0–44)
AST: 20 U/L (ref 15–41)
Albumin: 3.8 g/dL (ref 3.5–5.0)
Alkaline Phosphatase: 45 U/L (ref 38–126)
Anion gap: 7 (ref 5–15)
BUN: 20 mg/dL (ref 8–23)
CO2: 27 mmol/L (ref 22–32)
Calcium: 9.3 mg/dL (ref 8.9–10.3)
Chloride: 105 mmol/L (ref 98–111)
Creatinine: 0.74 mg/dL (ref 0.44–1.00)
GFR, Est AFR Am: >60 mL/min (ref >60)
GFR, Estimated: >60 mL/min (ref >60)
Glucose, Bld: 107 mg/dL — ABNORMAL HIGH (ref 70–99)
Potassium: 3.4 mmol/L — ABNORMAL LOW (ref 3.5–5.1)
Sodium: 139 mmol/L (ref 135–145)
Total Bilirubin: 0.5 mg/dL (ref 0.3–1.2)
Total Protein: 7.9 g/dL (ref 6.5–8.1)

## 2019-11-21 MED ORDER — DARATUMUMAB-HYALURONIDASE-FIHJ 1800-30000 MG-UT/15ML ~~LOC~~ SOLN
1800.0000 mg | Freq: Once | SUBCUTANEOUS | Status: AC
  Administered 2019-11-21: 1800 mg via SUBCUTANEOUS
  Filled 2019-11-21: qty 15

## 2019-11-21 MED ORDER — DEXAMETHASONE 4 MG PO TABS
20.0000 mg | ORAL_TABLET | Freq: Once | ORAL | Status: AC
  Administered 2019-11-21: 20 mg via ORAL

## 2019-11-21 MED ORDER — MONTELUKAST SODIUM 10 MG PO TABS
10.0000 mg | ORAL_TABLET | Freq: Once | ORAL | Status: DC
  Filled 2019-11-21: qty 1

## 2019-11-21 MED ORDER — DEXAMETHASONE 4 MG PO TABS
ORAL_TABLET | ORAL | Status: AC
  Filled 2019-11-21: qty 5

## 2019-11-21 MED ORDER — DIPHENHYDRAMINE HCL 25 MG PO CAPS
ORAL_CAPSULE | ORAL | Status: AC
  Filled 2019-11-21: qty 2

## 2019-11-21 MED ORDER — DEXAMETHASONE 4 MG PO TABS: 4.0000 mg | ORAL_TABLET | Freq: Every day | ORAL | 4 refills | Status: DC

## 2019-11-21 MED ORDER — DIPHENHYDRAMINE HCL 25 MG PO CAPS
50.0000 mg | ORAL_CAPSULE | Freq: Once | ORAL | Status: AC
  Administered 2019-11-21: 11:00:00 50 mg via ORAL

## 2019-11-21 MED ORDER — ACETAMINOPHEN 325 MG PO TABS
ORAL_TABLET | ORAL | Status: AC
  Filled 2019-11-21: qty 2

## 2019-11-21 MED ORDER — ACETAMINOPHEN 325 MG PO TABS
650.0000 mg | ORAL_TABLET | Freq: Once | ORAL | Status: AC
  Administered 2019-11-21: 650 mg via ORAL

## 2019-11-21 NOTE — Progress Notes (Signed)
Per Dr. Ennever, okay to treat today despite labs ?

## 2019-11-21 NOTE — Telephone Encounter (Signed)
Called and LMVM for patient with updated patient schedule per 1/12 los

## 2019-11-21 NOTE — Patient Instructions (Signed)
Daratumumab injection What is this medicine? DARATUMUMAB (dar a toom ue mab) is a monoclonal antibody. It is used to treat multiple myeloma. This medicine may be used for other purposes; ask your health care provider or pharmacist if you have questions. COMMON BRAND NAME(S): DARZALEX What should I tell my health care provider before I take this medicine? They need to know if you have any of these conditions:  infection (especially a virus infection such as chickenpox, herpes, or hepatitis B virus)  lung or breathing disease  an unusual or allergic reaction to daratumumab, other medicines, foods, dyes, or preservatives  pregnant or trying to get pregnant  breast-feeding How should I use this medicine? This medicine is for infusion into a vein. It is given by a health care professional in a hospital or clinic setting. Talk to your pediatrician regarding the use of this medicine in children. Special care may be needed. Overdosage: If you think you have taken too much of this medicine contact a poison control center or emergency room at once. NOTE: This medicine is only for you. Do not share this medicine with others. What if I miss a dose? Keep appointments for follow-up doses as directed. It is important not to miss your dose. Call your doctor or health care professional if you are unable to keep an appointment. What may interact with this medicine? Interactions have not been studied. This list may not describe all possible interactions. Give your health care provider a list of all the medicines, herbs, non-prescription drugs, or dietary supplements you use. Also tell them if you smoke, drink alcohol, or use illegal drugs. Some items may interact with your medicine. What should I watch for while using this medicine? This drug may make you feel generally unwell. Report any side effects. Continue your course of treatment even though you feel ill unless your doctor tells you to stop. This  medicine can cause serious allergic reactions. To reduce your risk you may need to take medicine before treatment with this medicine. Take your medicine as directed. This medicine can affect the results of blood tests to match your blood type. These changes can last for up to 6 months after the final dose. Your healthcare provider will do blood tests to match your blood type before you start treatment. Tell all of your healthcare providers that you are being treated with this medicine before receiving a blood transfusion. This medicine can affect the results of some tests used to determine treatment response; extra tests may be needed to evaluate response. Do not become pregnant while taking this medicine or for 3 months after stopping it. Women should inform their doctor if they wish to become pregnant or think they might be pregnant. There is a potential for serious side effects to an unborn child. Talk to your health care professional or pharmacist for more information. What side effects may I notice from receiving this medicine? Side effects that you should report to your doctor or health care professional as soon as possible:  allergic reactions like skin rash, itching or hives, swelling of the face, lips, or tongue  breathing problems  chills  cough  dizziness  feeling faint or lightheaded  headache  low blood counts - this medicine may decrease the number of white blood cells, red blood cells and platelets. You may be at increased risk for infections and bleeding.  nausea, vomiting  shortness of breath  signs of decreased platelets or bleeding - bruising, pinpoint red spots on  the skin, black, tarry stools, blood in the urine  signs of decreased red blood cells - unusually weak or tired, feeling faint or lightheaded, falls  signs of infection - fever or chills, cough, sore throat, pain or difficulty passing urine  signs and symptoms of liver injury like dark yellow or brown  urine; general ill feeling or flu-like symptoms; light-colored stools; loss of appetite; right upper belly pain; unusually weak or tired; yellowing of the eyes or skin Side effects that usually do not require medical attention (report to your doctor or health care professional if they continue or are bothersome):  back pain  constipation  diarrhea  joint pain  muscle cramps  pain, tingling, numbness in the hands or feet  swelling of the ankles, feet, hands  tiredness  trouble sleeping This list may not describe all possible side effects. Call your doctor for medical advice about side effects. You may report side effects to FDA at 1-800-FDA-1088. Where should I keep my medicine? This drug is given in a hospital or clinic and will not be stored at home. NOTE: This sheet is a summary. It may not cover all possible information. If you have questions about this medicine, talk to your doctor, pharmacist, or health care provider.  2020 Elsevier/Gold Standard (2019-07-04 18:10:54)  

## 2019-11-21 NOTE — Progress Notes (Signed)
Hematology and Oncology Follow Up Visit  Tami [name] [id number] [birth year] 64 y.o. 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 She is doing well but notes that she stays cold and has occasional nausea.  She has had mid abdominal pain off and on and her PCP has referred her to a gynecologist for further work up.  No fever, vomiting, cough, rash, dizziness, SOB, chest pain, palpitations, abdominal pain or changes in bladder habits.  She has diarrhea off and on and will try taking a probiotic daily.  No episodes of bleeding. No bruising or petechiae.  The numbness in the bottoms of her feet is unchanged.  No swelling, tenderness or tingling in her extremities.  No falls or syncopal episodes to report.  She has maintained a good appetite and states that she is staying well hydrated. Her weight is stable.   ECOG Performance Status: 1 - Symptomatic but completely ambulatory  Medications:  Allergies as of 11/21/2019      Reactions   Benazepril Anaphylaxis, Swelling   angioedema Throat and lip swelling   Ondansetron Hcl Hives   Redness and hives post IV admin on 07/05/17   Codeine Nausea And Vomiting   Morphine Hives   Redness and hives noted post IV admin on 07/05/17      Medication List       Accurate as of November 21, 2019  9:29 AM. If you have any questions, ask your nurse or doctor.        acetaminophen 325 MG tablet Commonly known as: TYLENOL Take 650 mg by mouth.   ACETAMINOPHEN-BUTALBITAL 50-325 MG Tabs Take 1 tablet by mouth 2 (two) times daily as needed.   albuterol 108 (90 Base) MCG/ACT  inhaler Commonly known as: VENTOLIN HFA Inhale 2 puffs into the lungs every 6 (six) hours as needed for wheezing or shortness of breath.   ALPRAZolam 0.25 MG tablet Commonly known as: Xanax Take 1-2 tabs (0.25mg -0.50mg ) 30-60 minutes before procedure. May repeat if needed.Do not drive.   amLODipine 5 MG tablet Commonly known as: NORVASC 1 tab in am and 2 tabs po pm   amLODipine 10 MG tablet Commonly known as: NORVASC Take 1 tablet (10 mg total) by mouth 2 (two) times daily.   ARTIFICIAL TEARS OP Apply to eye.   aspirin EC 81 MG tablet Take 1 tablet (81 mg total) by mouth daily.   carvedilol 25 MG tablet Commonly known as: COREG Take 1 tablet (25 mg total) by mouth 2 (two) times daily with a meal.   cyclobenzaprine 10 MG tablet Commonly known as: FLEXERIL Take 10 mg by mouth as needed.   Emgality 120 MG/ML Soaj Generic drug: Galcanezumab-gnlm Inject 120 mg into the skin every 30 (thirty) days.   famciclovir 250 MG tablet Commonly known as: FAMVIR Take 1 tablet (250 mg total) by mouth daily.   famotidine 40 MG tablet Commonly known as: PEPCID Take 1 tablet (40 mg total) by mouth at bedtime.   fluticasone 50 MCG/ACT nasal spray Commonly known as: FLONASE Place 2 sprays into both nostrils daily. What changed:   when to take this  reasons to take this  furosemide 20 MG tablet Commonly known as: LASIX Take 1 tablet (20 mg total) by mouth daily as needed for fluid or edema.   HYDROcodone-acetaminophen 5-325 MG tablet Commonly known as: NORCO/VICODIN Take 1 tablet by mouth every 4 (four) hours as needed for moderate pain or severe pain.   hyoscyamine 0.125 MG SL tablet Commonly known as: LEVSIN SL Place 1 tablet (0.125 mg total) under the tongue every 4 (four) hours as needed.   lidocaine 5 % Commonly known as: Lidoderm Place 1 patch onto the skin daily. Remove & Discard patch within 12 hours or as directed by MD   loratadine 10 MG tablet Commonly known  as: CLARITIN Take 1 tablet (10 mg total) by mouth 2 (two) times daily as needed for allergies.   metoCLOPramide 10 MG tablet Commonly known as: Reglan Take 1 tablet (10 mg total) by mouth 4 (four) times daily. For migraine or headache or nausea   montelukast 10 MG tablet Commonly known as: Singulair Take 1 tablet (10 mg total) by mouth at bedtime. Start taking Singulair on 11/16/2019.   MULTIVITAMIN ADULT PO Take by mouth.   Nurtec 75 MG Tbdp Generic drug: Rimegepant Sulfate Take 75 mg by mouth daily as needed. For migraines. Take as close to onset of migraine as possible. One daily maximum.   ondansetron 4 MG tablet Commonly known as: Zofran Take 1 tablet (4 mg total) by mouth every 8 (eight) hours as needed for nausea or vomiting.   pantoprazole 40 MG tablet Commonly known as: PROTONIX Take 1 tablet (40 mg total) by mouth 2 (two) times daily.   pomalidomide 2 MG capsule Commonly known as: POMALYST Take 1 capsule (2 mg total) by mouth daily. Every 21 days. Auth # [id number]   potassium chloride SA 20 MEQ tablet Commonly known as: KLOR-CON Take 2 tablets (40 mEq total) by mouth once for 1 dose.   Premarin vaginal cream Generic drug: conjugated estrogens Place vaginally.   promethazine 25 MG tablet Commonly known as: PHENERGAN Take 1 tablet (25 mg total) by mouth every 8 (eight) hours as needed for nausea or vomiting.   sucralfate 1 g tablet Commonly known as: Carafate Take 1 tablet (1 g total) by mouth 4 (four) times daily -  with meals and at bedtime.   SUMAtriptan 100 MG tablet Commonly known as: IMITREX 1 tab PO at headache onset, may repeat in 2 hrs as needed. No more than 2 tabs per day or 4 tabs per wk. (Can dispense #9 if quality limit)   topiramate 50 MG tablet Commonly known as: Topamax Take 1 tablet (50 mg total) by mouth 2 (two) times daily.   triamterene-hydrochlorothiazide 37.5-25 MG tablet Commonly known as: MAXZIDE-25 Take 1 tablet by mouth daily.        Allergies:  Allergies  Allergen Reactions  . Benazepril Anaphylaxis and Swelling    angioedema Throat and lip swelling  . Ondansetron Hcl Hives    Redness and hives post IV admin on 07/05/17  . Codeine Nausea And Vomiting  . Morphine Hives    Redness and hives noted post IV admin on 07/05/17    Past Medical History, Surgical history, Social history, and Family History were reviewed and updated.  Review of Systems: All other 10 point review of systems is negative.   Physical Exam:  vitals were not taken for this visit.   Wt Readings from Last 3 Encounters:  11/13/19 194 lb 9.6 oz (88.3 kg)  10/31/19 192 lb (87.1 kg)  10/19/19 190 lb 6.4 oz (86.4 kg)    Ocular: Sclerae unicteric, pupils equal, round and reactive to light Ear-nose-throat: Oropharynx clear, dentition fair Lymphatic: No cervical or supraclavicular adenopathy Lungs no rales or rhonchi, good excursion bilaterally Heart regular rate and rhythm, no murmur appreciated Abd soft, nontender, positive bowel sounds, no liver or spleen tip palpated on exam, no fluid wave  MSK no focal spinal tenderness, no joint edema Neuro: non-focal, well-oriented, appropriate affect Breasts: Deferred   Lab Results  Component Value Date   WBC 2.7 (L) 11/21/2019   HGB 11.6 (L) 11/21/2019   HCT 36.3 11/21/2019   MCV 90.1 11/21/2019   PLT 124 (L) 11/21/2019   Lab Results  Component Value Date   FERRITIN 96 10/19/2019   IRON 59 10/19/2019   TIBC 343 10/19/2019   UIBC 284 10/19/2019   IRONPCTSAT 17 (L) 10/19/2019   Lab Results  Component Value Date   RETICCTPCT 0.8 07/16/2016   RBC 4.03 11/21/2019   RETICCTABS 32,640 07/16/2016   Lab Results  Component Value Date   KPAFRELGTCHN 45.0 (H) 10/19/2019   LAMBDASER 11.8 10/19/2019   KAPLAMBRATIO 3.81 (H) 10/19/2019   Lab Results  Component Value Date   IGGSERUM 2,869 (H) 10/19/2019   IGA 232 10/19/2019   IGMSERUM 108 10/19/2019   Lab Results  Component Value  Date   TOTALPROTELP 8.5 10/19/2019   ALBUMINELP 3.5 10/19/2019   A1GS 0.2 10/19/2019   A2GS 1.0 10/19/2019   BETS 1.2 10/19/2019   BETA2SER 0.5 07/16/2016   GAMS 2.5 (H) 10/19/2019   MSPIKE 1.5 (H) 10/19/2019   SPEI Comment 10/19/2019     Chemistry      Component Value Date/Time   NA 140 10/31/2019 1059   NA 143 11/08/2017 1127   NA 140 12/31/2016 1000   K 4.0 10/31/2019 1059   K 4.0 11/08/2017 1127   K 3.6 12/31/2016 1000   CL 105 10/31/2019 1059   CL 107 11/08/2017 1127   CO2 28 10/31/2019 1059   CO2 25 11/08/2017 1127   CO2 24 12/31/2016 1000   BUN 17 10/31/2019 1059   BUN 19 11/08/2017 1127   BUN 22.3 12/31/2016 1000   CREATININE 0.94 10/31/2019 1059   CREATININE 0.9 11/08/2017 1127   CREATININE 0.8 12/31/2016 1000      Component Value Date/Time   CALCIUM 9.2 10/31/2019 1059   CALCIUM 9.9 11/08/2017 1127   CALCIUM 9.5 12/31/2016 1000   ALKPHOS 39 10/31/2019 1059   ALKPHOS 44 11/08/2017 1127   ALKPHOS 51 12/31/2016 1000   AST 23 10/31/2019 1059   AST 28 12/31/2016 1000   ALT 15 10/31/2019 1059   ALT 36 11/08/2017 1127   ALT 16 12/31/2016 1000   BILITOT 0.4 10/31/2019 1059   BILITOT 0.46 12/31/2016 1000       Impression and Plan: Ms. Monce is a very pleasant 64 yo African American female with a previous IgG kappa MGUS with progression to myeloma. We will proceed with her first cycle of Darzalex Faspro today as planned. We discussed her new schedule, drug administration and possible side effects.  She will come weekly for her injection for 8 weeks and then go to every other week. Follow-up in 3 weeks.  She will contact our office with any questions or concerns. We can certainly see her sooner if needed.    Laverna Peace, NP 1/12/20219:29 AM

## 2019-11-22 ENCOUNTER — Other Ambulatory Visit: Payer: Self-pay | Admitting: Hematology & Oncology

## 2019-11-22 LAB — IGG, IGA, IGM
IgA: 211 mg/dL (ref 87–352)
IgG (Immunoglobin G), Serum: 2424 mg/dL — ABNORMAL HIGH (ref 586–1602)
IgM (Immunoglobulin M), Srm: 98 mg/dL (ref 26–217)

## 2019-11-22 LAB — KAPPA/LAMBDA LIGHT CHAINS
Kappa free light chain: 43.5 mg/L — ABNORMAL HIGH (ref 3.3–19.4)
Kappa, lambda light chain ratio: 4.48 — ABNORMAL HIGH (ref 0.26–1.65)
Lambda free light chains: 9.7 mg/L (ref 5.7–26.3)

## 2019-11-24 LAB — IMMUNOFIXATION REFLEX, SERUM
IgA: 233 mg/dL (ref 87–352)
IgG (Immunoglobin G), Serum: 2833 mg/dL — ABNORMAL HIGH (ref 586–1602)
IgM (Immunoglobulin M), Srm: 109 mg/dL (ref 26–217)

## 2019-11-24 LAB — PROTEIN ELECTROPHORESIS, SERUM, WITH REFLEX
A/G Ratio: 0.7 (ref 0.7–1.7)
Albumin ELP: 3.5 g/dL (ref 2.9–4.4)
Alpha-1-Globulin: 0.3 g/dL (ref 0.0–0.4)
Alpha-2-Globulin: 1 g/dL (ref 0.4–1.0)
Beta Globulin: 1.2 g/dL (ref 0.7–1.3)
Gamma Globulin: 2.3 g/dL — ABNORMAL HIGH (ref 0.4–1.8)
Globulin, Total: 4.7 g/dL — ABNORMAL HIGH (ref 2.2–3.9)
M-Spike, %: 1.3 g/dL — ABNORMAL HIGH
SPEP Interpretation: 0
Total Protein ELP: 8.2 g/dL (ref 6.0–8.5)

## 2019-11-26 DIAGNOSIS — M62838 Other muscle spasm: Secondary | ICD-10-CM | POA: Diagnosis not present

## 2019-11-26 DIAGNOSIS — M542 Cervicalgia: Secondary | ICD-10-CM | POA: Diagnosis not present

## 2019-11-26 DIAGNOSIS — R519 Headache, unspecified: Secondary | ICD-10-CM | POA: Diagnosis not present

## 2019-11-26 DIAGNOSIS — R0989 Other specified symptoms and signs involving the circulatory and respiratory systems: Secondary | ICD-10-CM | POA: Diagnosis not present

## 2019-11-27 DIAGNOSIS — R110 Nausea: Secondary | ICD-10-CM | POA: Diagnosis not present

## 2019-11-27 DIAGNOSIS — R42 Dizziness and giddiness: Secondary | ICD-10-CM | POA: Diagnosis not present

## 2019-11-27 DIAGNOSIS — R519 Headache, unspecified: Secondary | ICD-10-CM | POA: Diagnosis not present

## 2019-11-29 ENCOUNTER — Encounter: Payer: Self-pay | Admitting: Medical

## 2019-11-29 ENCOUNTER — Ambulatory Visit (INDEPENDENT_AMBULATORY_CARE_PROVIDER_SITE_OTHER): Payer: Medicare Other | Admitting: Medical

## 2019-11-29 ENCOUNTER — Ambulatory Visit: Payer: Medicare Other | Admitting: Medical

## 2019-11-29 ENCOUNTER — Other Ambulatory Visit: Payer: Self-pay

## 2019-11-29 VITALS — BP 137/80 | HR 94 | Temp 97.0°F | Resp 16 | Ht 66.0 in | Wt 194.8 lb

## 2019-11-29 DIAGNOSIS — M542 Cervicalgia: Secondary | ICD-10-CM | POA: Diagnosis not present

## 2019-11-29 DIAGNOSIS — S46811A Strain of other muscles, fascia and tendons at shoulder and upper arm level, right arm, initial encounter: Secondary | ICD-10-CM

## 2019-11-29 DIAGNOSIS — G44209 Tension-type headache, unspecified, not intractable: Secondary | ICD-10-CM

## 2019-11-29 MED ORDER — PREDNISONE 10 MG PO TABS: ORAL_TABLET | ORAL | 0 refills | Status: DC

## 2019-11-29 MED ORDER — KETOROLAC TROMETHAMINE 30 MG/ML IJ SOLN
30.0000 mg | Freq: Once | INTRAMUSCULAR | Status: AC
  Administered 2019-11-29: 30 mg via INTRAVENOUS

## 2019-11-29 NOTE — Patient Instructions (Signed)
You have good neurologic exam today in office.  Your headaches recently seem more associated with the right trapezius muscle pain and migraine-like.  Presently headache features seem more tension headache.  We will go ahead and get sed rate today to evaluate possible temporal arteritis.  Will give Toradol 30 mg IM in the office.  We will have me use Flexeril 1 tablet at night.  Tomorrow afternoon you can start short 4-day course of tapered course of prednisone.  If he has headache with neurologic type signs symptoms and recommend ED evaluation again.  I am going to have our staff send over the Medtronic serial number/model number information to neurologist office so they can go ahead and order the MR studies as here brain.  Hopefully that will be able to be done before your follow-up appointment in February.  Follow-up with if needed prior to neurologist appointment early February.  Also keep regular scheduled appointment with your PCP.

## 2019-11-29 NOTE — Progress Notes (Signed)
Subjective:    [name], female    DOB: [birth year], 64 y.o.   MRN: [id number]  HPI  Pt in for follow up.  She states she feels little better than she did in the ED. She states no HA presently but states presently head feels more sore rt side of head, occipital area and some rt side neck pain. She states hurt to move her neck on Saturday. Hurt to move neck on Saturday. But now can move neck with much less pain.  Pt went to the ED   Hpi in ED on 11/27/2019 Patient presents to the ED with headache onset 3 days ago. Patient describes this as a constant sharp stabbing pain that starts to the right occipital, and radiates down her neck. Patient denies any syncope or preceding factors. Patient reports associated nausea and dizziness. Her symptoms resolve with Tylenol, but reoccur. Patient denies vision changes, eye pain, chest pain, shortness of breath, weakness, numbness   Ct of head showed. FINDINGS: Brain: Normal ventricular morphology. No midline shift or mass effect. Normal appearance of brain parenchyma. No intracranial hemorrhage, mass lesion, evidence of acute infarction, or extra-axial fluid collection.  Vascular: No hyperdense vessels  Skull: Intact  Sinuses/Orbits: Clear  Other: N/A  IMPRESSION: Normal exam.  Pt has seen Dr. Jaynee Eagles in past. She has follow up with Dr. Jaynee Eagles in February.  Pt has started [employer] after tyring aimovig.   Medications tried that can be used for migraine prevention/acute: Reglan, losartan, zofran, phenergan, topamax, imitrex, maxalt, norvasc, fioricet, carvedilol, flexeril, compazine, maxide, verapamil, tizanidine.  Specialist was trying to get MR studies but specialist office stated they need some information regarding her pacemaker. Maybe serial number, make, model etc. Pt has tried to talk with imaging center to give info. Imaging dept was busy. They said they would call her back. Pt states they never called her back.  With  ha the other day. She was not reporting any sound sensitivity or light sensitivity.  Pt has rx flexeril given by Romelle Starcher. Only took one tablet.  Review of Systems  Constitutional: Negative for chills, fatigue and fever.  Respiratory: Negative for cough, chest tightness, shortness of breath and wheezing.   Cardiovascular: Negative for chest pain and palpitations.  Gastrointestinal: Negative for abdominal pain, diarrhea, nausea and vomiting.  Musculoskeletal: Positive for neck pain. Negative for back pain and gait problem.  Skin: Negative for rash.  Neurological: Positive for headaches. Negative for dizziness, seizures, speech difficulty, weakness and light-headedness.  Hematological: Negative for adenopathy. Does not bruise/bleed easily.  Psychiatric/Behavioral: Negative for behavioral problems and confusion.    Past Medical History:  Diagnosis Date  . Abnormal SPEP 07/26/2016  . Arthritis    knees, hands  . Back pain 07/14/2016  . Bowel obstruction ([place]) 11/2014  . C. difficile diarrhea   . CHF (congestive heart failure) ([place])   . Colitis   . Congestive heart failure ([place]) 02/24/2015   S/p pacemaker  . Depression   . Elevated sed rate 08/15/2017  . Epigastric pain 03/22/2017  . GERD (gastroesophageal reflux disease)   . Heart disease   . Hyperlipidemia   . Hypertension   . 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 Low back pain 07/14/2016  . 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 Multiple myeloma ([place]) 07/20/2018  . Multiple myeloma not having achieved remission ([place]) 07/20/2018  . Myalgia 12/31/2016  . Obstructive sleep apnea 03/31/2015  . SOB (shortness of breath) 04/09/2016  . 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 UTI (urinary tract infection)      Social History   Socioeconomic History  . Marital status: Divorced    Spouse name: [name]  . Number of children: 3  . Years of education: [name]  . Highest education level: High school graduate    Occupational History  . [name]  Tobacco Use  . Smoking status: Never Smoker  . Smokeless tobacco: Never Used  Substance and Sexual Activity  . Alcohol use: No  . Drug use: No  . Sexual activity: [name]  Other Topics Concern  . [name]  Social History Narrative   Lives at home alone   Retired   Caffeine: coffee   Social Determinants of Health   Financial Resource Strain:   . Difficulty of Paying Living Expenses: [name]  Food Insecurity:   . Worried About Charity fundraiser in the Last Year: [name]  . Ran Out of Food in the Last Year: [name]  Transportation Needs:   . Lack of Transportation (Medical): [name]  . Lack of Transportation (Non-Medical): [name]  Physical Activity:   . Days of Exercise per Week: [name]  . Minutes of Exercise per Session: [name]  Stress:   . Feeling of Stress : [name]  Social Connections:   . Frequency of Communication with Friends and Family: [name]  . Frequency of Social Gatherings with Friends and Family: [name]  . Attends Religious Services: [name]  . Active Member of Clubs or Organizations: [name]  . Attends Archivist Meetings: [name]  . Marital Status: [name]  Intimate Partner Violence:   . Fear of Current or Ex-Partner: [name]  . Emotionally Abused: [name]  . Physically Abused: [name]  . Sexually Abused: [name]    Past Surgical History:  Procedure Laterality Date  . ABDOMINAL HYSTERECTOMY     menorraghia, 2006, total  . CHOLECYSTECTOMY    . COLONOSCOPY  2018   [facility] per patient  . ESOPHAGOGASTRODUODENOSCOPY  2018   [facility]   . KNEE SURGERY     right, repair torn torn cartialage  . PACEMAKER INSERTION  08/2014  . TONSILLECTOMY    . TUBAL LIGATION      Family History  Problem Relation Age of Onset  . Diabetes Mother   . Hypertension Mother   . Heart disease Mother        s/p  1 stent  . Hyperlipidemia Mother   . Arthritis Mother   . Cancer Father        COLON  . Colon cancer Father 20  . Irritable bowel syndrome Sister   . Hyperlipidemia Daughter   . Hypertension Daughter   . Hypertension Maternal Grandmother   . Arthritis Maternal Grandmother   . Heart disease Maternal Grandfather        MI  . Hypertension Maternal Grandfather   . Arthritis Maternal Grandfather   . Hypertension Son   . Esophageal cancer Neg Hx     Allergies  Allergen Reactions  . Benazepril Anaphylaxis and Swelling    angioedema Throat and lip swelling  . Ondansetron Hcl Hives    Redness and hives post IV admin on 07/05/17  . Codeine Nausea And Vomiting  . Morphine Hives    Redness and  hives noted post IV admin on 07/05/17    Current Outpatient Medications on File Prior to Visit  Medication Sig Dispense Refill  . acetaminophen (TYLENOL) 325 MG tablet Take 650 mg by mouth.    . ACETAMINOPHEN-BUTALBITAL 50-325 MG TABS Take 1 tablet by mouth 2 (two) times daily as needed. 120 tablet 1  . albuterol (PROVENTIL HFA;VENTOLIN HFA) 108 (90 Base) MCG/ACT inhaler Inhale 2 puffs into the lungs every 6 (six) hours as needed for wheezing or shortness of breath. 1 Inhaler 2  . ALPRAZolam (XANAX) 0.25 MG tablet Take 1-2 tabs (0.63m-0.50mg) 30-60 minutes before procedure. May repeat if needed.Do not drive. 4 tablet 0  . amLODipine (NORVASC) 10 MG tablet Take 1 tablet (10 mg total) by mouth 2 (two) times daily. 60 tablet 3  . aspirin EC 81 MG tablet Take 1 tablet (81 mg total) by mouth daily. 90 tablet 1  . Carboxymethylcellulose Sodium (ARTIFICIAL TEARS OP) Apply to eye.    . carvedilol (COREG) 25 MG tablet Take 1 tablet (25 mg total) by mouth 2 (two) times daily with a meal. 180 tablet 1  . Cholecalciferol (VITAMIN D-3) 25 MCG (1000 UT) CAPS Take by mouth.    . conjugated estrogens (PREMARIN) vaginal cream Place vaginally.    . cyclobenzaprine (FLEXERIL) 10 MG tablet Take 10 mg by mouth as  needed.     .Marland Kitchendexamethasone (DECADRON) 4 MG tablet Take 1 tablet (4 mg total) by mouth daily. Take for 2 days starting the night of chemotherapy. 20 tablet 4  . famciclovir (FAMVIR) 250 MG tablet Take 1 tablet (250 mg total) by mouth daily. 30 tablet 12  . famotidine (PEPCID) 40 MG tablet Take 1 tablet (40 mg total) by mouth at bedtime. 30 tablet 5  . fluticasone (FLONASE) 50 MCG/ACT nasal spray Place 2 sprays into both nostrils daily. (Patient taking differently: Place 2 sprays into both nostrils as needed. ) 16 g 1  . furosemide (LASIX) 20 MG tablet Take 1 tablet (20 mg total) by mouth daily as needed for fluid or edema. 30 tablet 2  . Galcanezumab-gnlm (EMGALITY) 120 MG/ML SOAJ Inject 120 mg into the skin every 30 (thirty) days. 1 pen 11  . HYDROcodone-acetaminophen (NORCO/VICODIN) 5-325 MG tablet Take 1 tablet by mouth every 4 (four) hours as needed for moderate pain or severe pain. 20 tablet 0  . hyoscyamine (LEVSIN SL) 0.125 MG SL tablet Place 1 tablet (0.125 mg total) under the tongue every 4 (four) hours as needed. 30 tablet 2  . lidocaine (LIDODERM) 5 % Place 1 patch onto the skin daily. Remove & Discard patch within 12 hours or as directed by MD 30 patch 2  . loratadine (CLARITIN) 10 MG tablet Take 1 tablet (10 mg total) by mouth 2 (two) times daily as needed for allergies. 30 tablet 11  . Melatonin 3 MG CAPS Take by mouth.    . metoCLOPramide (REGLAN) 10 MG tablet Take 1 tablet (10 mg total) by mouth 4 (four) times daily. For migraine or headache or nausea 30 tablet 6  . montelukast (SINGULAIR) 10 MG tablet Take 1 tablet (10 mg total) by mouth at bedtime. Start taking Singulair on 11/16/2019. 20 tablet 0  . Multiple Vitamins-Minerals (MULTIVITAMIN ADULT PO) Take by mouth.    . ondansetron (ZOFRAN) 4 MG tablet Take 1 tablet (4 mg total) by mouth every 8 (eight) hours as needed for nausea or vomiting. 30 tablet 1  . pantoprazole (PROTONIX) 40 MG tablet Take 1 tablet (40  mg total) by mouth 2  (two) times daily. 180 tablet 1  . pomalidomide (POMALYST) 2 MG capsule Take 1 capsule (2 mg total) by mouth daily. Every 21 days. Auth # [id number] 21 capsule 0  . potassium chloride SA (K-DUR) 20 MEQ tablet Take 2 tablets (40 mEq total) by mouth once for 1 dose. 60 tablet 3  . promethazine (PHENERGAN) 25 MG tablet Take 1 tablet (25 mg total) by mouth every 8 (eight) hours as needed for nausea or vomiting. 30 tablet 2  . Rimegepant Sulfate (NURTEC) 75 MG TBDP Take 75 mg by mouth daily as needed. For migraines. Take as close to onset of migraine as possible. One daily maximum. 10 tablet 6  . sucralfate (CARAFATE) 1 g tablet Take 1 tablet (1 g total) by mouth 4 (four) times daily -  with meals and at bedtime. 120 tablet 0  . SUMAtriptan (IMITREX) 100 MG tablet 1 tab PO at headache onset, may repeat in 2 hrs as needed. No more than 2 tabs per day or 4 tabs per wk. (Can dispense #9 if quality limit)    . topiramate (TOPAMAX) 50 MG tablet Take 1 tablet (50 mg total) by mouth 2 (two) times daily. 60 tablet 3  . triamterene-hydrochlorothiazide (MAXZIDE-25) 37.5-25 MG tablet Take 1 tablet by mouth daily.     No current facility-administered medications on file prior to visit.    BP (!) 158/90 (BP Location: Right Arm, Patient Position: Sitting, Cuff Size: Normal)   Pulse 94   Temp (!) 97 F (36.1 C) (Temporal)   Resp 16   Ht _0  (1.676 m)   Wt 194 lb 12.8 oz (88.4 kg)   SpO2 96%   BMI 31.44 kg/m       Objective:   Physical Exam  General Mental Status- Alert. General Appearance- Not in acute distress.   Skin General: Color- Normal Color. Moisture- Normal Moisture.  Neck Carotid Arteries- Normal color. Moisture- Normal Moisture. No carotid bruits. No JVD. On palpation rt trapezius tender on palpation through out. In occipital area in particular on rt side. No stiffness  Chest and Lung Exam Auscultation: Breath Sounds:-Normal.  Cardiovascular Auscultation:Rythm- Regular. Murmurs &  Other Heart Sounds:Auscultation of the heart reveals- No Murmurs.  Abdomen Inspection:-Inspeection Normal. Palpation/Percussion:Note:No mass. Palpation and Percussion of the abdomen reveal- Non Tender, Non Distended + BS, no rebound or guarding.   Neurologic Cranial Nerve exam:- CN III-XII intact(No nystagmus), symmetric smile. Drift Test:- No drift. Romberg Exam:- Negative.  Heal to Toe Gait exam:-Normal. Finger to Nose:- Normal/Intact Strength:- 5/5 equal and symmetric strength both upper and lower extremities.      Assessment & Plan:  You have good neurologic exam today in office.  Your headaches recently seem more associated with the right trapezius muscle pain and migraine-like.  Presently headache features seem more tension headache.  We will go ahead and get sed rate today to evaluate possible temporal arteritis.  Will give Toradol 30 mg IM in the office.  We will have me use Flexeril 1 tablet at night.  Tomorrow afternoon you can start short 4-day course of tapered course of prednisone.  If he has headache with neurologic type signs symptoms and recommend ED evaluation again.  I am going to have our staff send over the Medtronic serial number/model number information to neurologist office so they can go ahead and order the MR studies as here brain.  Hopefully that will be able to be done before your follow-up appointment in  February.  Follow-up with if needed prior to neurologist appointment early February.  Also keep regular scheduled appointment with your PCP.  40 minutes spent with patient today.  50% time spent counseling patient on plan going forward.

## 2019-11-29 NOTE — Progress Notes (Signed)
   Subjective:    [name], female    DOB: 1956/03/05, 64 y.o.   MRN: [id number]  HPI  Pt seen in afternoon.  Review of Systems     Objective:   Physical Exam        Assessment & Plan:

## 2019-11-29 NOTE — Addendum Note (Signed)
Addended by: Bernell List R on: 11/29/2019 02:30 PM   Modules accepted: Orders

## 2019-11-30 ENCOUNTER — Inpatient Hospital Stay: Payer: Medicare Other

## 2019-11-30 ENCOUNTER — Other Ambulatory Visit: Payer: Self-pay | Admitting: *Deleted

## 2019-11-30 VITALS — BP 137/85 | HR 85 | Temp 97.7°F | Resp 20

## 2019-11-30 DIAGNOSIS — C9002 Multiple myeloma in relapse: Secondary | ICD-10-CM

## 2019-11-30 DIAGNOSIS — R519 Headache, unspecified: Secondary | ICD-10-CM | POA: Diagnosis not present

## 2019-11-30 DIAGNOSIS — Z5112 Encounter for antineoplastic immunotherapy: Secondary | ICD-10-CM | POA: Diagnosis not present

## 2019-11-30 DIAGNOSIS — C9000 Multiple myeloma not having achieved remission: Secondary | ICD-10-CM | POA: Diagnosis not present

## 2019-11-30 LAB — CBC WITH DIFFERENTIAL (CANCER CENTER ONLY)
Abs Immature Granulocytes: 0.02 10*3/uL (ref 0.00–0.07)
Basophils Absolute: 0 10*3/uL (ref 0.0–0.1)
Basophils Relative: 1 %
Eosinophils Absolute: 0.1 10*3/uL (ref 0.0–0.5)
Eosinophils Relative: 3 %
HCT: 40.8 % (ref 36.0–46.0)
Hemoglobin: 13 g/dL (ref 12.0–15.0)
Immature Granulocytes: 1 %
Lymphocytes Relative: 25 %
Lymphs Abs: 1.1 10*3/uL (ref 0.7–4.0)
MCH: 28.6 pg (ref 26.0–34.0)
MCHC: 31.9 g/dL (ref 30.0–36.0)
MCV: 89.7 fL (ref 80.0–100.0)
Monocytes Absolute: 0.4 10*3/uL (ref 0.1–1.0)
Monocytes Relative: 8 %
Neutro Abs: 2.7 10*3/uL (ref 1.7–7.7)
Neutrophils Relative %: 62 %
Platelet Count: 152 10*3/uL (ref 150–400)
RBC: 4.55 MIL/uL (ref 3.87–5.11)
RDW: 14.4 % (ref 11.5–15.5)
WBC Count: 4.3 10*3/uL (ref 4.0–10.5)
nRBC: 0 % (ref 0.0–0.2)

## 2019-11-30 LAB — CMP (CANCER CENTER ONLY)
ALT: 16 U/L (ref 0–44)
AST: 17 U/L (ref 15–41)
Albumin: 4 g/dL (ref 3.5–5.0)
Alkaline Phosphatase: 45 U/L (ref 38–126)
Anion gap: 7 (ref 5–15)
BUN: 16 mg/dL (ref 8–23)
CO2: 31 mmol/L (ref 22–32)
Calcium: 9.8 mg/dL (ref 8.9–10.3)
Chloride: 104 mmol/L (ref 98–111)
Creatinine: 0.68 mg/dL (ref 0.44–1.00)
GFR, Est AFR Am: >60 mL/min (ref >60)
GFR, Estimated: >60 mL/min (ref >60)
Glucose, Bld: 109 mg/dL — ABNORMAL HIGH (ref 70–99)
Potassium: 3.7 mmol/L (ref 3.5–5.1)
Sodium: 142 mmol/L (ref 135–145)
Total Bilirubin: 0.5 mg/dL (ref 0.3–1.2)
Total Protein: 8.7 g/dL — ABNORMAL HIGH (ref 6.5–8.1)

## 2019-11-30 LAB — SEDIMENTATION RATE: Sed Rate: 77 mm/hr — ABNORMAL HIGH (ref 0–30)

## 2019-11-30 MED ORDER — MONTELUKAST SODIUM 10 MG PO TABS
10.0000 mg | ORAL_TABLET | Freq: Once | ORAL | Status: AC
  Administered 2019-11-30: 10:00:00 10 mg via ORAL
  Filled 2019-11-30: qty 1

## 2019-11-30 MED ORDER — ACETAMINOPHEN 325 MG PO TABS
650.0000 mg | ORAL_TABLET | Freq: Once | ORAL | Status: AC
  Administered 2019-11-30: 10:00:00 650 mg via ORAL

## 2019-11-30 MED ORDER — DIPHENHYDRAMINE HCL 25 MG PO CAPS
ORAL_CAPSULE | ORAL | Status: AC
  Filled 2019-11-30: qty 2

## 2019-11-30 MED ORDER — DEXAMETHASONE 4 MG PO TABS
20.0000 mg | ORAL_TABLET | Freq: Once | ORAL | Status: AC
  Administered 2019-11-30: 10:00:00 20 mg via ORAL

## 2019-11-30 MED ORDER — DIPHENHYDRAMINE HCL 25 MG PO CAPS
50.0000 mg | ORAL_CAPSULE | Freq: Once | ORAL | Status: AC
  Administered 2019-11-30: 10:00:00 50 mg via ORAL

## 2019-11-30 MED ORDER — DARATUMUMAB-HYALURONIDASE-FIHJ 1800-30000 MG-UT/15ML ~~LOC~~ SOLN
1800.0000 mg | Freq: Once | SUBCUTANEOUS | Status: AC
  Administered 2019-11-30: 11:00:00 1800 mg via SUBCUTANEOUS
  Filled 2019-11-30: qty 15

## 2019-11-30 MED ORDER — ACETAMINOPHEN 325 MG PO TABS
ORAL_TABLET | ORAL | Status: AC
  Filled 2019-11-30: qty 2

## 2019-11-30 MED ORDER — DEXAMETHASONE 4 MG PO TABS
ORAL_TABLET | ORAL | Status: AC
  Filled 2019-11-30: qty 5

## 2019-11-30 NOTE — Patient Instructions (Signed)
Daratumumab injection What is this medicine? DARATUMUMAB (dar a toom ue mab) is a monoclonal antibody. It is used to treat multiple myeloma. This medicine may be used for other purposes; ask your health care provider or pharmacist if you have questions. COMMON BRAND NAME(S): DARZALEX What should I tell my health care provider before I take this medicine? They need to know if you have any of these conditions:  infection (especially a virus infection such as chickenpox, herpes, or hepatitis B virus)  lung or breathing disease  an unusual or allergic reaction to daratumumab, other medicines, foods, dyes, or preservatives  pregnant or trying to get pregnant  breast-feeding How should I use this medicine? This medicine is for infusion into a vein. It is given by a health care professional in a hospital or clinic setting. Talk to your pediatrician regarding the use of this medicine in children. Special care may be needed. Overdosage: If you think you have taken too much of this medicine contact a poison control center or emergency room at once. NOTE: This medicine is only for you. Do not share this medicine with others. What if I miss a dose? Keep appointments for follow-up doses as directed. It is important not to miss your dose. Call your doctor or health care professional if you are unable to keep an appointment. What may interact with this medicine? Interactions have not been studied. This list may not describe all possible interactions. Give your health care provider a list of all the medicines, herbs, non-prescription drugs, or dietary supplements you use. Also tell them if you smoke, drink alcohol, or use illegal drugs. Some items may interact with your medicine. What should I watch for while using this medicine? This drug may make you feel generally unwell. Report any side effects. Continue your course of treatment even though you feel ill unless your doctor tells you to stop. This  medicine can cause serious allergic reactions. To reduce your risk you may need to take medicine before treatment with this medicine. Take your medicine as directed. This medicine can affect the results of blood tests to match your blood type. These changes can last for up to 6 months after the final dose. Your healthcare provider will do blood tests to match your blood type before you start treatment. Tell all of your healthcare providers that you are being treated with this medicine before receiving a blood transfusion. This medicine can affect the results of some tests used to determine treatment response; extra tests may be needed to evaluate response. Do not become pregnant while taking this medicine or for 3 months after stopping it. Women should inform their doctor if they wish to become pregnant or think they might be pregnant. There is a potential for serious side effects to an unborn child. Talk to your health care professional or pharmacist for more information. What side effects may I notice from receiving this medicine? Side effects that you should report to your doctor or health care professional as soon as possible:  allergic reactions like skin rash, itching or hives, swelling of the face, lips, or tongue  breathing problems  chills  cough  dizziness  feeling faint or lightheaded  headache  low blood counts - this medicine may decrease the number of white blood cells, red blood cells and platelets. You may be at increased risk for infections and bleeding.  nausea, vomiting  shortness of breath  signs of decreased platelets or bleeding - bruising, pinpoint red spots on  the skin, black, tarry stools, blood in the urine  signs of decreased red blood cells - unusually weak or tired, feeling faint or lightheaded, falls  signs of infection - fever or chills, cough, sore throat, pain or difficulty passing urine  signs and symptoms of liver injury like dark yellow or brown  urine; general ill feeling or flu-like symptoms; light-colored stools; loss of appetite; right upper belly pain; unusually weak or tired; yellowing of the eyes or skin Side effects that usually do not require medical attention (report to your doctor or health care professional if they continue or are bothersome):  back pain  constipation  diarrhea  joint pain  muscle cramps  pain, tingling, numbness in the hands or feet  swelling of the ankles, feet, hands  tiredness  trouble sleeping This list may not describe all possible side effects. Call your doctor for medical advice about side effects. You may report side effects to FDA at 1-800-FDA-1088. Where should I keep my medicine? This drug is given in a hospital or clinic and will not be stored at home. NOTE: This sheet is a summary. It may not cover all possible information. If you have questions about this medicine, talk to your doctor, pharmacist, or health care provider.  2020 Elsevier/Gold Standard (2019-07-04 18:10:54)  

## 2019-12-03 DIAGNOSIS — R519 Headache, unspecified: Secondary | ICD-10-CM | POA: Diagnosis not present

## 2019-12-05 ENCOUNTER — Other Ambulatory Visit: Payer: Self-pay | Admitting: *Deleted

## 2019-12-05 ENCOUNTER — Telehealth: Payer: Self-pay | Admitting: *Deleted

## 2019-12-05 DIAGNOSIS — C9000 Multiple myeloma not having achieved remission: Secondary | ICD-10-CM

## 2019-12-05 MED ORDER — PROCHLORPERAZINE MALEATE 10 MG PO TABS: 10.0000 mg | ORAL_TABLET | Freq: Four times a day (QID) | ORAL | 1 refills | Status: DC | PRN

## 2019-12-05 MED ORDER — LORAZEPAM 0.5 MG PO TABS: 0.5000 mg | ORAL_TABLET | Freq: Three times a day (TID) | ORAL | 0 refills | Status: DC

## 2019-12-05 NOTE — Telephone Encounter (Signed)
Call received from patient concerned that Darzalex is causing her headaches to worsen.  Pt also states that she is having increased nausea, but no decrease in appetite.  Pt states that she is taking Singulair for nausea and nothing else. Instructed pt that Singulair is not for nausea.  Pt states that she does have Compazine at home for nausea.  Pt instructed to take Compazine now and that I will call her back once I speak with Dr. Marin Olp regarding her complaints. Dr. Marin Olp notified of above.  Call placed back to patient and patient notified that prescription for Ativan will be added and sent to her pharmacy for nausea per order of Dr. Marin Olp.  Pt states that the Compazine did slightly help her nausea. Pt instructed to continue Compazine as needed for nausea along with Ativan as needed.  Pt informed that headaches are not a usual side effect of Darzalex per Dr. Marin Olp and to call her neurologist regarding headaches.  Informed pt that Dr. Marin Olp would like to see her this Thursday after her lab appt.  Pt appreciative of call back and states that she will call her neurologist now regarding headaches.

## 2019-12-07 ENCOUNTER — Encounter: Payer: Self-pay | Admitting: Hematology & Oncology

## 2019-12-07 ENCOUNTER — Other Ambulatory Visit: Payer: Self-pay

## 2019-12-07 ENCOUNTER — Inpatient Hospital Stay (HOSPITAL_BASED_OUTPATIENT_CLINIC_OR_DEPARTMENT_OTHER): Payer: Medicare Other | Admitting: Hematology & Oncology

## 2019-12-07 ENCOUNTER — Inpatient Hospital Stay: Payer: Medicare Other

## 2019-12-07 VITALS — BP 145/94 | HR 115 | Temp 97.1°F | Resp 18 | Wt 189.5 lb

## 2019-12-07 VITALS — BP 136/83 | HR 82

## 2019-12-07 DIAGNOSIS — C9000 Multiple myeloma not having achieved remission: Secondary | ICD-10-CM

## 2019-12-07 DIAGNOSIS — D801 Nonfamilial hypogammaglobulinemia: Secondary | ICD-10-CM

## 2019-12-07 DIAGNOSIS — R519 Headache, unspecified: Secondary | ICD-10-CM | POA: Diagnosis not present

## 2019-12-07 DIAGNOSIS — D509 Iron deficiency anemia, unspecified: Secondary | ICD-10-CM

## 2019-12-07 DIAGNOSIS — C9002 Multiple myeloma in relapse: Secondary | ICD-10-CM

## 2019-12-07 DIAGNOSIS — Z5112 Encounter for antineoplastic immunotherapy: Secondary | ICD-10-CM | POA: Diagnosis not present

## 2019-12-07 LAB — CBC WITH DIFFERENTIAL (CANCER CENTER ONLY)
Abs Immature Granulocytes: 0.01 10*3/uL (ref 0.00–0.07)
Basophils Absolute: 0 10*3/uL (ref 0.0–0.1)
Basophils Relative: 1 %
Eosinophils Absolute: 0 10*3/uL (ref 0.0–0.5)
Eosinophils Relative: 1 %
HCT: 41.7 % (ref 36.0–46.0)
Hemoglobin: 13.4 g/dL (ref 12.0–15.0)
Immature Granulocytes: 0 %
Lymphocytes Relative: 23 %
Lymphs Abs: 0.8 10*3/uL (ref 0.7–4.0)
MCH: 28.8 pg (ref 26.0–34.0)
MCHC: 32.1 g/dL (ref 30.0–36.0)
MCV: 89.7 fL (ref 80.0–100.0)
Monocytes Absolute: 0.3 10*3/uL (ref 0.1–1.0)
Monocytes Relative: 7 %
Neutro Abs: 2.4 10*3/uL (ref 1.7–7.7)
Neutrophils Relative %: 68 %
Platelet Count: 163 10*3/uL (ref 150–400)
RBC: 4.65 MIL/uL (ref 3.87–5.11)
RDW: 14.4 % (ref 11.5–15.5)
WBC Count: 3.6 10*3/uL — ABNORMAL LOW (ref 4.0–10.5)
nRBC: 0 % (ref 0.0–0.2)

## 2019-12-07 LAB — CMP (CANCER CENTER ONLY)
ALT: 13 U/L (ref 0–44)
AST: 14 U/L — ABNORMAL LOW (ref 15–41)
Albumin: 4.1 g/dL (ref 3.5–5.0)
Alkaline Phosphatase: 50 U/L (ref 38–126)
Anion gap: 8 (ref 5–15)
BUN: 17 mg/dL (ref 8–23)
CO2: 26 mmol/L (ref 22–32)
Calcium: 9.8 mg/dL (ref 8.9–10.3)
Chloride: 106 mmol/L (ref 98–111)
Creatinine: 0.78 mg/dL (ref 0.44–1.00)
GFR, Est AFR Am: >60 mL/min (ref >60)
GFR, Estimated: >60 mL/min (ref >60)
Glucose, Bld: 141 mg/dL — ABNORMAL HIGH (ref 70–99)
Potassium: 3.6 mmol/L (ref 3.5–5.1)
Sodium: 140 mmol/L (ref 135–145)
Total Bilirubin: 0.6 mg/dL (ref 0.3–1.2)
Total Protein: 8.8 g/dL — ABNORMAL HIGH (ref 6.5–8.1)

## 2019-12-07 MED ORDER — SODIUM CHLORIDE 0.9 % IV SOLN
INTRAVENOUS | Status: DC
  Filled 2019-12-07 (×2): qty 250

## 2019-12-07 MED ORDER — KETOROLAC TROMETHAMINE 15 MG/ML IJ SOLN
INTRAMUSCULAR | Status: AC
  Filled 2019-12-07: qty 2

## 2019-12-07 MED ORDER — KETOROLAC TROMETHAMINE 15 MG/ML IJ SOLN
30.0000 mg | Freq: Once | INTRAMUSCULAR | Status: AC
  Administered 2019-12-07: 30 mg via INTRAVENOUS
  Filled 2019-12-07: qty 2

## 2019-12-07 NOTE — Patient Instructions (Addendum)
General Headache Without Cause A headache is pain or discomfort that is felt around the head or neck area. There are many causes and types of headaches. In some cases, the cause may not be found. Follow these instructions at home: Watch your condition for any changes. Let your doctor know about them. Take these steps to help with your condition: Managing pain      Take over-the-counter and prescription medicines only as told by your doctor.  Lie down in a dark, quiet room when you have a headache.  If told, put ice on your head and neck area: ? Put ice in a plastic bag. ? Place a towel between your skin and the bag. ? Leave the ice on for 20 minutes, 2-3 times per day.  If told, put heat on the affected area. Use the heat source that your doctor recommends, such as a moist heat pack or a heating pad. ? Place a towel between your skin and the heat source. ? Leave the heat on for 20-30 minutes. ? Remove the heat if your skin turns bright red. This is very important if you are unable to feel pain, heat, or cold. You may have a greater risk of getting burned.  Keep lights dim if bright lights bother you or make your headaches worse. Eating and drinking  Eat meals on a regular schedule.  If you drink alcohol: ? Limit how much you use to:  0-1 drink a day for women.  0-2 drinks a day for men. ? Be aware of how much alcohol is in your drink. In the [place] one drink equals one 12 oz bottle of beer (355 mL), one 5 oz glass of wine (148 mL), or one 1 oz glass of hard liquor (44 mL).  Stop drinking caffeine, or reduce how much caffeine you drink. General instructions   Keep a journal to find out if certain things bring on headaches. For example, write down: ? What you eat and drink. ? How much sleep you get. ? Any change to your diet or medicines.  Get a massage or try other ways to relax.  Limit stress.  Sit up straight. Do not tighten (tense) your muscles.  Do not use any  products that contain nicotine or tobacco. This includes cigarettes, e-cigarettes, and chewing tobacco. If you need help quitting, ask your doctor.  Exercise regularly as told by your doctor.  Get enough sleep. This often means 7-9 hours of sleep each night.  Keep all follow-up visits as told by your doctor. This is important. Contact a doctor if:  Your symptoms are not helped by medicine.  You have a headache that feels different than the other headaches.  You feel sick to your stomach (nauseous) or you throw up (vomit).  You have a fever. Get help right away if:  Your headache gets very bad quickly.  Your headache gets worse after a lot of physical activity.  You keep throwing up.  You have a stiff neck.  You have trouble seeing.  You have trouble speaking.  You have pain in the eye or ear.  Your muscles are weak or you lose muscle control.  You lose your balance or have trouble walking.  You feel like you will pass out (faint) or you pass out.  You are mixed up (confused).  You have a seizure. Summary  A headache is pain or discomfort that is felt around the head or neck area.  There are many causes and   types of headaches. In some cases, the cause may not be found.  Keep a journal to help find out what causes your headaches. Watch your condition for any changes. Let your doctor know about them.  Contact a doctor if you have a headache that is different from usual, or if your headache is not helped by medicine.  Get help right away if your headache gets very bad, you throw up, you have trouble seeing, you lose your balance, or you have a seizure. This information is not intended to replace advice given to you by your health care provider. Make sure you discuss any questions you have with your health care provider. Document Revised: 05/16/2018 Document Reviewed: 05/16/2018 Elsevier Patient Education  [place].  Dehydration, Adult Dehydration is  condition in which there is not enough water or other fluids in the body. This happens when a person loses more fluids than he or she takes in. Important body parts cannot work right without the right amount of fluids. Any loss of fluids from the body can cause dehydration. Dehydration can be mild, worse, or very bad. It should be treated right away to keep it from getting very bad. What are the causes? This condition may be caused by:  Conditions that cause loss of water or other fluids, such as: ? Watery poop (diarrhea). ? Vomiting. ? Sweating a lot. ? Peeing (urinating) a lot.  Not drinking enough fluids, especially when you: ? Are ill. ? Are doing things that take a lot of energy to do.  Other illnesses and conditions, such as fever or infection.  Certain medicines, such as medicines that take extra fluid out of the body (diuretics).  Lack of safe drinking water.  Not being able to get enough water and food. What increases the risk? The following factors may make you more likely to develop this condition:  Having a long-term (chronic) illness that has not been treated the right way, such as: ? Diabetes. ? Heart disease. ? Kidney disease.  Being 33 years of age or older.  Having a disability.  Living in a place that is high above the ground or sea (high in altitude). The thinner, dried air causes more fluid loss.  Doing exercises that put stress on your body for a long time. What are the signs or symptoms? Symptoms of dehydration depend on how bad it is. Mild or worse dehydration  Thirst.  Dry lips or dry mouth.  Feeling dizzy or light-headed, especially when you stand up from sitting.  Muscle cramps.  Your body making: ? Dark pee (urine). Pee may be the color of tea. ? Less pee than normal. ? Less tears than normal.  Headache. Very bad dehydration  Changes in skin. Skin may: ? Be cold to the touch (clammy). ? Be blotchy or pale. ? Not go back to normal  right after you lightly pinch it and let it go.  Little or no tears, pee, or sweat.  Changes in vital signs, such as: ? Fast breathing. ? Low blood pressure. ? Weak pulse. ? Pulse that is more than 100 beats a minute when you are sitting still.  Other changes, such as: ? Feeling very thirsty. ? Eyes that look hollow (sunken). ? Cold hands and feet. ? Being mixed up (confused). ? Being very tired (lethargic) or having trouble waking from sleep. ? Short-term weight loss. ? Loss of consciousness. How is this treated? Treatment for this condition depends on how bad it is.  Treatment should start right away. Do not wait until your condition gets very bad. Very bad dehydration is an emergency. You will need to go to a hospital.  Mild or worse dehydration can be treated at home. You may be asked to: ? Drink more fluids. ? Drink an oral rehydration solution (ORS). This drink helps get the right amounts of fluids and salts and minerals in the blood (electrolytes).  Very bad dehydration can be treated: ? With fluids through an IV tube. ? By getting normal levels of salts and minerals in your blood. This is often done by giving salts and minerals through a tube. The tube is passed through your nose and into your stomach. ? By treating the root cause. Follow these instructions at home: Oral rehydration solution If told by your doctor, drink an ORS:  Make an ORS. Use instructions on the package.  Start by drinking small amounts, about  cup (120 mL) every 5-10 minutes.  Slowly drink more until you have had the amount that your doctor said to have. Eating and drinking         Drink enough clear fluid to keep your pee pale yellow. If you were told to drink an ORS, finish the ORS first. Then, start slowly drinking other clear fluids. Drink fluids such as: ? Water. Do not drink only water. Doing that can make the salt (sodium) level in your body get too low. ? Water from ice chips you  suck on. ? Fruit juice that you have added water to (diluted). ? Low-calorie sports drinks.  Eat foods that have the right amounts of salts and minerals, such as: ? Bananas. ? Oranges. ? Potatoes. ? Tomatoes. ? Spinach.  Do not drink alcohol.  Avoid: ? Drinks that have a lot of sugar. These include:  High-calorie sports drinks.  Fruit juice that you did not add water to.  Soda.  Caffeine. ? Foods that are greasy or have a lot of fat or sugar. General instructions  Take over-the-counter and prescription medicines only as told by your doctor.  Do not take salt tablets. Doing that can make the salt level in your body get too high.  Return to your normal activities as told by your doctor. Ask your doctor what activities are safe for you.  Keep all follow-up visits as told by your doctor. This is important. Contact a doctor if:  You have pain in your belly (abdomen) and the pain: ? Gets worse. ? Stays in one place.  You have a rash.  You have a stiff neck.  You get angry or annoyed (irritable) more easily than normal.  You are more tired or have a harder time waking than normal.  You feel: ? Weak or dizzy. ? Very thirsty. Get help right away if you have:  Any symptoms of very bad dehydration.  Symptoms of vomiting, such as: ? You cannot eat or drink without vomiting. ? Your vomiting gets worse or does not go away. ? Your vomit has blood or green stuff in it.  Symptoms that get worse with treatment.  A fever.  A very bad headache.  Problems with peeing or pooping (having a bowel movement), such as: ? Watery poop that gets worse or does not go away. ? Blood in your poop (stool). This may cause poop to look black and tarry. ? Not peeing in 6-8 hours. ? Peeing only a small amount of very dark pee in 6-8 hours.  Trouble breathing. These symptoms  may be an emergency. Do not wait to see if the symptoms will go away. Get medical help right away. Call your  local emergency services (911 in the [place] Do not drive yourself to the hospital. Summary  Dehydration is a condition in which there is not enough water or other fluids in the body. This happens when a person loses more fluids than he or she takes in.  Treatment for this condition depends on how bad it is. Treatment should be started right away. Do not wait until your condition gets very bad.  Drink enough clear fluid to keep your pee pale yellow. If you were told to drink an oral rehydration solution (ORS), finish the ORS first. Then, start slowly drinking other clear fluids.  Take over-the-counter and prescription medicines only as told by your doctor.  Get help right away if you have any symptoms of very bad dehydration. This information is not intended to replace advice given to you by your health care provider. Make sure you discuss any questions you have with your health care provider. Document Revised: 06/08/2019 Document Reviewed: 06/08/2019 Elsevier Patient Education  [place].

## 2019-12-07 NOTE — Progress Notes (Signed)
Hematology and Oncology Follow Up Visit  Tami [name] [id number] Mar 13, 1956 64 y.o. 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 Benz is here today for follow-up and treatment.  I am not sure exactly what is going on.  However, it seems like she is having terrible headaches.  This started about 3 weeks ago.  Of note, she had a wonderful time down in [place].  She was down there for about a week.  She really enjoyed herself.  She is with her family.  Daratumumab does report a 12% incidence of headaches.  She has been to the ER.  She is going to see a headache doctor.  I am just going to stop the daratumumab right now.  Her myeloma really is not that bad.  Her last M spike was 1.3 g/dL.  With a headache, there is no other symptoms.  There is no nausea or vomiting.  She has had no visual changes.  She has some problems with hearing from the right ear.  Not sure exactly what that is all about.  I did look into the ear canals I did not see anything that looked infectious or any fluid.  She has had no fever.  There is no cough.  She has had no leg swelling.  Again, we are just going to stop the daratumumab.  I must say that I am surprised that she has had difficulty with all the treatments that we have tried on her.  Currently, her performance status is ECOG two.    Medications:  Allergies as of 12/07/2019      Reactions   Benazepril Anaphylaxis, Swelling   angioedema Throat and lip swelling   Ondansetron Hcl Hives   Redness and hives post IV admin on 07/05/17   Codeine Nausea And Vomiting   Morphine Hives   Redness and hives noted post IV admin on 07/05/17      Medication List       Accurate  as of December 07, 2019 10:59 AM. If you have any questions, ask your nurse or doctor.        acetaminophen 325 MG tablet Commonly known as: TYLENOL Take 650 mg by mouth.   ACETAMINOPHEN-BUTALBITAL 50-325 MG Tabs Take 1 tablet by mouth 2 (two) times daily as needed.   albuterol 108 (90 Base) MCG/ACT inhaler Commonly known as: VENTOLIN HFA Inhale 2 puffs into the lungs every 6 (six) hours as needed for wheezing or shortness of breath.   ALPRAZolam 0.25 MG tablet Commonly known as: Xanax Take 1-2 tabs (0.25mg -0.50mg ) 30-60 minutes before procedure. May repeat if needed.Do not drive.   amLODipine 10 MG tablet Commonly known as: NORVASC Take 1 tablet (10 mg total) by mouth 2 (two) times daily.   ARTIFICIAL TEARS OP Apply to eye.   aspirin EC 81 MG tablet Take 1 tablet (81 mg total) by mouth daily.   carvedilol 25 MG tablet Commonly known as: COREG Take 1 tablet (25 mg total) by mouth 2 (two) times daily with a meal.   cyclobenzaprine 10 MG tablet Commonly known as: FLEXERIL Take 10 mg by mouth as needed.   dexamethasone 4 MG tablet Commonly known as: DECADRON Take 1 tablet (  4 mg total) by mouth daily. Take for 2 days starting the night of chemotherapy.   Emgality 120 MG/ML Soaj Generic drug: Galcanezumab-gnlm Inject 120 mg into the skin every 30 (thirty) days.   famciclovir 250 MG tablet Commonly known as: FAMVIR Take 1 tablet (250 mg total) by mouth daily.   famotidine 40 MG tablet Commonly known as: PEPCID Take 1 tablet (40 mg total) by mouth at bedtime.   fluticasone 50 MCG/ACT nasal spray Commonly known as: FLONASE Place 2 sprays into both nostrils daily. What changed:   when to take this  reasons to take this   furosemide 20 MG tablet Commonly known as: LASIX Take 1 tablet (20 mg total) by mouth daily as needed for fluid or edema.   HYDROcodone-acetaminophen 5-325 MG tablet Commonly known as: NORCO/VICODIN Take 1 tablet by mouth every 4 (four) hours  as needed for moderate pain or severe pain.   hyoscyamine 0.125 MG SL tablet Commonly known as: LEVSIN SL Place 1 tablet (0.125 mg total) under the tongue every 4 (four) hours as needed.   lidocaine 5 % Commonly known as: Lidoderm Place 1 patch onto the skin daily. Remove & Discard patch within 12 hours or as directed by MD   loratadine 10 MG tablet Commonly known as: CLARITIN Take 1 tablet (10 mg total) by mouth 2 (two) times daily as needed for allergies.   LORazepam 0.5 MG tablet Commonly known as: ATIVAN Take 1 tablet (0.5 mg total) by mouth every 8 (eight) hours.   Melatonin 3 MG Caps Take by mouth.   metoCLOPramide 10 MG tablet Commonly known as: Reglan Take 1 tablet (10 mg total) by mouth 4 (four) times daily. For migraine or headache or nausea   montelukast 10 MG tablet Commonly known as: Singulair Take 1 tablet (10 mg total) by mouth at bedtime. Start taking Singulair on 11/16/2019.   MULTIVITAMIN ADULT PO Take by mouth.   Nurtec 75 MG Tbdp Generic drug: Rimegepant Sulfate Take 75 mg by mouth daily as needed. For migraines. Take as close to onset of migraine as possible. One daily maximum.   ondansetron 4 MG tablet Commonly known as: Zofran Take 1 tablet (4 mg total) by mouth every 8 (eight) hours as needed for nausea or vomiting.   pantoprazole 40 MG tablet Commonly known as: PROTONIX Take 1 tablet (40 mg total) by mouth 2 (two) times daily.   pomalidomide 2 MG capsule Commonly known as: POMALYST Take 1 capsule (2 mg total) by mouth daily. Every 21 days. Auth # [id number]   potassium chloride SA 20 MEQ tablet Commonly known as: KLOR-CON Take 2 tablets (40 mEq total) by mouth once for 1 dose.   predniSONE 10 MG tablet Commonly known as: DELTASONE 4 tab po day 1, 3 tab po day 2, 2 tab po day 3, 1 tab po day 4   Premarin vaginal cream Generic drug: conjugated estrogens Place vaginally.   prochlorperazine 10 MG tablet Commonly known as: COMPAZINE Take  1 tablet (10 mg total) by mouth every 6 (six) hours as needed (Nausea or vomiting).   promethazine 25 MG tablet Commonly known as: PHENERGAN Take 1 tablet (25 mg total) by mouth every 8 (eight) hours as needed for nausea or vomiting.   sucralfate 1 g tablet Commonly known as: Carafate Take 1 tablet (1 g total) by mouth 4 (four) times daily -  with meals and at bedtime.   SUMAtriptan 100 MG tablet Commonly known as: IMITREX 1 tab PO  at headache onset, may repeat in 2 hrs as needed. No more than 2 tabs per day or 4 tabs per wk. (Can dispense #9 if quality limit)   topiramate 50 MG tablet Commonly known as: Topamax Take 1 tablet (50 mg total) by mouth 2 (two) times daily.   triamterene-hydrochlorothiazide 37.5-25 MG tablet Commonly known as: MAXZIDE-25 Take 1 tablet by mouth daily.   Vitamin D-3 25 MCG (1000 UT) Caps Take by mouth.       Allergies:  Allergies  Allergen Reactions  . Benazepril Anaphylaxis and Swelling    angioedema Throat and lip swelling  . Ondansetron Hcl Hives    Redness and hives post IV admin on 07/05/17  . Codeine Nausea And Vomiting  . Morphine Hives    Redness and hives noted post IV admin on 07/05/17    Past Medical History, Surgical history, Social history, and Family History were reviewed and updated.  Review of Systems: Review of Systems  Constitutional: Negative.   HENT: Negative.   Eyes: Negative.   Respiratory: Negative.   Cardiovascular: Negative.   Gastrointestinal: Negative.   Genitourinary: Negative.   Musculoskeletal: Negative.   Skin: Negative.   Neurological: Negative.   Endo/Heme/Allergies: Negative.   Psychiatric/Behavioral: Negative.       Physical Exam:  weight is 189 lb 8 oz (86 kg). Her temporal temperature is 97.1 F (36.2 C) (abnormal). Her blood pressure is 145/94 (abnormal) and her pulse is 115 (abnormal). Her respiration is 18 and oxygen saturation is 98%.   Wt Readings from Last 3 Encounters:  12/07/19 189  lb 8 oz (86 kg)  11/29/19 194 lb 12.8 oz (88.4 kg)  11/29/19 190 lb (86.2 kg)    Physical Exam Vitals reviewed.  HENT:     Head: Normocephalic and atraumatic.  Eyes:     Pupils: Pupils are equal, round, and reactive to light.  Cardiovascular:     Rate and Rhythm: Normal rate and regular rhythm.     Heart sounds: Normal heart sounds.  Pulmonary:     Effort: Pulmonary effort is normal.     Breath sounds: Normal breath sounds.  Abdominal:     General: Bowel sounds are normal.     Palpations: Abdomen is soft.  Musculoskeletal:        General: No tenderness or deformity. Normal range of motion.     Cervical back: Normal range of motion.  Lymphadenopathy:     Cervical: No cervical adenopathy.  Skin:    General: Skin is warm and dry.     Findings: No erythema or rash.  Neurological:     Mental Status: She is alert and oriented to person, place, and time.  Psychiatric:        Behavior: Behavior normal.        Thought Content: Thought content normal.        Judgment: Judgment normal.      Lab Results  Component Value Date   WBC 3.6 (L) 12/07/2019   HGB 13.4 12/07/2019   HCT 41.7 12/07/2019   MCV 89.7 12/07/2019   PLT 163 12/07/2019   Lab Results  Component Value Date   FERRITIN 96 10/19/2019   IRON 59 10/19/2019   TIBC 343 10/19/2019   UIBC 284 10/19/2019   IRONPCTSAT 17 (L) 10/19/2019   Lab Results  Component Value Date   RETICCTPCT 0.8 07/16/2016   RBC 4.65 12/07/2019   RETICCTABS 32,640 07/16/2016   Lab Results  Component Value Date   KPAFRELGTCHN  43.5 (H) 11/21/2019   LAMBDASER 9.7 11/21/2019   KAPLAMBRATIO 4.48 (H) 11/21/2019   Lab Results  Component Value Date   IGGSERUM 2,833 (H) 11/21/2019   IGA 233 11/21/2019   IGMSERUM 109 11/21/2019   Lab Results  Component Value Date   TOTALPROTELP 8.2 11/21/2019   ALBUMINELP 3.5 11/21/2019   A1GS 0.3 11/21/2019   A2GS 1.0 11/21/2019   BETS 1.2 11/21/2019   BETA2SER 0.5 07/16/2016   GAMS 2.3 (H)  11/21/2019   MSPIKE 1.3 (H) 11/21/2019   SPEI Comment 10/19/2019     Chemistry      Component Value Date/Time   NA 142 11/30/2019 0911   NA 143 11/08/2017 1127   NA 140 12/31/2016 1000   K 3.7 11/30/2019 0911   K 4.0 11/08/2017 1127   K 3.6 12/31/2016 1000   CL 104 11/30/2019 0911   CL 107 11/08/2017 1127   CO2 31 11/30/2019 0911   CO2 25 11/08/2017 1127   CO2 24 12/31/2016 1000   BUN 16 11/30/2019 0911   BUN 19 11/08/2017 1127   BUN 22.3 12/31/2016 1000   CREATININE 0.68 11/30/2019 0911   CREATININE 0.9 11/08/2017 1127   CREATININE 0.8 12/31/2016 1000      Component Value Date/Time   CALCIUM 9.8 11/30/2019 0911   CALCIUM 9.9 11/08/2017 1127   CALCIUM 9.5 12/31/2016 1000   ALKPHOS 45 11/30/2019 0911   ALKPHOS 44 11/08/2017 1127   ALKPHOS 51 12/31/2016 1000   AST 17 11/30/2019 0911   AST 28 12/31/2016 1000   ALT 16 11/30/2019 0911   ALT 36 11/08/2017 1127   ALT 16 12/31/2016 1000   BILITOT 0.5 11/30/2019 0911   BILITOT 0.46 12/31/2016 1000       Impression and Plan: Ms. Angle is a very pleasant 64 yo African American female with a previous IgG kappa MGUS with progression to myeloma.  For right now, we are going to stop the daratumumab.  I think we have to.  Her quality of life is really being adversely affected.  I really do not want to see her miserable.  We spent about 45 minutes with her.  We went through all of her medications.  We got rid of quite a few medicines that she just does not take.  Thankfully, we have the luxury of being able to hold on treatment for right now.  I cannot imagine that the myeloma is going to progress quickly by any means.  I know we have other options that we can pursue if necessary.  We will have her come back to see [place] in another 4 weeks.  Hopefully, she will be doing a lot better.  She is going to see a neurologist, or at least have a virtual visit, I think next week.  [name], MD 1/28/202110:59 AM

## 2019-12-08 DIAGNOSIS — I447 Left bundle-branch block, unspecified: Secondary | ICD-10-CM | POA: Diagnosis not present

## 2019-12-08 DIAGNOSIS — R0789 Other chest pain: Secondary | ICD-10-CM | POA: Diagnosis not present

## 2019-12-08 DIAGNOSIS — I420 Dilated cardiomyopathy: Secondary | ICD-10-CM | POA: Diagnosis not present

## 2019-12-08 DIAGNOSIS — Z4502 Encounter for adjustment and management of automatic implantable cardiac defibrillator: Secondary | ICD-10-CM | POA: Diagnosis not present

## 2019-12-08 DIAGNOSIS — Z95810 Presence of automatic (implantable) cardiac defibrillator: Secondary | ICD-10-CM | POA: Diagnosis not present

## 2019-12-08 LAB — PROTEIN ELECTROPHORESIS, SERUM
A/G Ratio: 0.8 (ref 0.7–1.7)
Albumin ELP: 3.6 g/dL (ref 2.9–4.4)
Alpha-1-Globulin: 0.2 g/dL (ref 0.0–0.4)
Alpha-2-Globulin: 1.1 g/dL — ABNORMAL HIGH (ref 0.4–1.0)
Beta Globulin: 1.2 g/dL (ref 0.7–1.3)
Gamma Globulin: 2.1 g/dL — ABNORMAL HIGH (ref 0.4–1.8)
Globulin, Total: 4.6 g/dL — ABNORMAL HIGH (ref 2.2–3.9)
M-Spike, %: 1.3 g/dL — ABNORMAL HIGH
Total Protein ELP: 8.2 g/dL (ref 6.0–8.5)

## 2019-12-08 LAB — IGG, IGA, IGM
IgA: 129 mg/dL (ref 87–352)
IgG (Immunoglobin G), Serum: 2301 mg/dL — ABNORMAL HIGH (ref 586–1602)
IgM (Immunoglobulin M), Srm: 88 mg/dL (ref 26–217)

## 2019-12-08 LAB — FERRITIN: Ferritin: 120 ng/mL (ref 11–307)

## 2019-12-08 LAB — KAPPA/LAMBDA LIGHT CHAINS
Kappa free light chain: 31.2 mg/L — ABNORMAL HIGH (ref 3.3–19.4)
Kappa, lambda light chain ratio: 11.14 — ABNORMAL HIGH (ref 0.26–1.65)
Lambda free light chains: 2.8 mg/L — ABNORMAL LOW (ref 5.7–26.3)

## 2019-12-08 LAB — IRON AND TIBC
Iron: 82 ug/dL (ref 41–142)
Saturation Ratios: 24 % (ref 21–57)
TIBC: 348 ug/dL (ref 236–444)
UIBC: 266 ug/dL (ref 120–384)

## 2019-12-11 ENCOUNTER — Ambulatory Visit: Payer: Medicare Other

## 2019-12-11 ENCOUNTER — Other Ambulatory Visit: Payer: Medicare Other

## 2019-12-11 ENCOUNTER — Ambulatory Visit: Payer: Medicare Other | Admitting: Hematology & Oncology

## 2019-12-11 ENCOUNTER — Telehealth: Payer: Self-pay

## 2019-12-11 DIAGNOSIS — Z79899 Other long term (current) drug therapy: Secondary | ICD-10-CM | POA: Diagnosis not present

## 2019-12-11 DIAGNOSIS — G43809 Other migraine, not intractable, without status migrainosus: Secondary | ICD-10-CM | POA: Diagnosis not present

## 2019-12-11 DIAGNOSIS — M542 Cervicalgia: Secondary | ICD-10-CM | POA: Diagnosis not present

## 2019-12-11 DIAGNOSIS — R519 Headache, unspecified: Secondary | ICD-10-CM | POA: Diagnosis not present

## 2019-12-12 ENCOUNTER — Ambulatory Visit (INDEPENDENT_AMBULATORY_CARE_PROVIDER_SITE_OTHER): Payer: Medicare Other | Admitting: Family Medicine

## 2019-12-12 ENCOUNTER — Other Ambulatory Visit: Payer: Self-pay

## 2019-12-12 DIAGNOSIS — G43711 Chronic migraine without aura, intractable, with status migrainosus: Secondary | ICD-10-CM | POA: Diagnosis not present

## 2019-12-12 NOTE — Progress Notes (Addendum)
° °  PATIENT: Tami [name] DOB: [birth year]  REASON FOR VISIT: follow up HISTORY FROM: patient  Virtual Visit via Telephone Note  I connected with Tami [name] on 12/14/19 at  2:30 PM EST by telephone and verified that I am speaking with the correct person using two identifiers.   I discussed the limitations, risks, security and privacy concerns of performing an evaluation and management service by telephone and the availability of in person appointments. I also discussed with the patient that there may be a patient responsible charge related to this service. The patient expressed understanding and agreed to proceed.   History of Present Illness:  12/14/19 Tami [name] is a 64 y.o. female here today for follow up for headaches. She was started on Emgality in 10/2019. She does feel that it has helped some with headaches. She has headaches about 3-4 times a week. She reports that she has persistent right ear pain. She reports that really bad headaches feel like an "explosion" in the back of her head that radiates to her right ear. She was seen by oncology last week and was having a really bad headache. She was not able to complete treatment due to pain. She was given Toradol and fluids and reports that headache improved. She did get Nurtec from pharmacy but has not taken.   Observations/Objective:  Generalized: Well developed, in no acute distress  Mentation: Alert oriented to time, place, history taking. Follows all commands speech and language fluent   Assessment and Plan:  64 y.o. year old female  has a past medical history of Abnormal SPEP (07/26/2016), Arthritis, Back pain (07/14/2016), Bowel obstruction (HCC) (11/2014), C. 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 here with    ICD-10-CM   1. Chronic migraine without aura, with intractable migraine, so stated, with status migrainosus  G43.711    She will continue current treatment plan. I have advised that she try Nurtec for abortive therapy. She will have PCP and ENT look at her ear to ensure no other etiology. She will increase water intake. I would like to see her back in 3 months, sooner if needed. She verbalizes understanding and agreement with this plan.   No orders of the defined types were placed in this encounter.   No orders of the defined types were placed in this encounter.    Follow Up Instructions:  I discussed the assessment and treatment plan with the patient. The patient was provided an opportunity to ask questions and all were answered. The patient agreed with the plan and demonstrated an understanding of the instructions.   The patient was advised to call back or seek an in-person evaluation if the symptoms worsen or if the condition fails to improve as anticipated.  I provided 25 minutes of non-face-to-face time during this encounter. Patient is located at her place of residence. Provider is in the office.    Debbora Presto, NP   Made any corrections needed, and agree with history, physical, neuro exam,assessment and plan as stated.     Sarina Ill, MD [facility]

## 2019-12-13 DIAGNOSIS — N952 Postmenopausal atrophic vaginitis: Secondary | ICD-10-CM | POA: Diagnosis not present

## 2019-12-14 ENCOUNTER — Inpatient Hospital Stay: Payer: Medicare Other

## 2019-12-14 ENCOUNTER — Other Ambulatory Visit: Payer: Self-pay

## 2019-12-14 ENCOUNTER — Telehealth: Payer: Self-pay | Admitting: Hematology & Oncology

## 2019-12-14 ENCOUNTER — Inpatient Hospital Stay (HOSPITAL_BASED_OUTPATIENT_CLINIC_OR_DEPARTMENT_OTHER): Payer: Medicare Other | Admitting: Hematology & Oncology

## 2019-12-14 ENCOUNTER — Encounter: Payer: Self-pay | Admitting: Family Medicine

## 2019-12-14 ENCOUNTER — Inpatient Hospital Stay: Payer: Medicare Other | Attending: Hematology & Oncology

## 2019-12-14 VITALS — BP 132/84 | HR 80 | Temp 97.7°F | Resp 20 | Wt 190.8 lb

## 2019-12-14 DIAGNOSIS — R519 Headache, unspecified: Secondary | ICD-10-CM | POA: Insufficient documentation

## 2019-12-14 DIAGNOSIS — C9000 Multiple myeloma not having achieved remission: Secondary | ICD-10-CM | POA: Insufficient documentation

## 2019-12-14 LAB — CBC WITH DIFFERENTIAL (CANCER CENTER ONLY)
Abs Immature Granulocytes: 0.02 10*3/uL (ref 0.00–0.07)
Basophils Absolute: 0 10*3/uL (ref 0.0–0.1)
Basophils Relative: 1 %
Eosinophils Absolute: 0 10*3/uL (ref 0.0–0.5)
Eosinophils Relative: 1 %
HCT: 36.7 % (ref 36.0–46.0)
Hemoglobin: 11.8 g/dL — ABNORMAL LOW (ref 12.0–15.0)
Immature Granulocytes: 1 %
Lymphocytes Relative: 21 %
Lymphs Abs: 0.7 10*3/uL (ref 0.7–4.0)
MCH: 28.8 pg (ref 26.0–34.0)
MCHC: 32.2 g/dL (ref 30.0–36.0)
MCV: 89.5 fL (ref 80.0–100.0)
Monocytes Absolute: 0.3 10*3/uL (ref 0.1–1.0)
Monocytes Relative: 10 %
Neutro Abs: 2.2 10*3/uL (ref 1.7–7.7)
Neutrophils Relative %: 66 %
Platelet Count: 158 10*3/uL (ref 150–400)
RBC: 4.1 MIL/uL (ref 3.87–5.11)
RDW: 14.2 % (ref 11.5–15.5)
WBC Count: 3.4 10*3/uL — ABNORMAL LOW (ref 4.0–10.5)
nRBC: 0 % (ref 0.0–0.2)

## 2019-12-14 LAB — CMP (CANCER CENTER ONLY)
ALT: 11 U/L (ref 0–44)
AST: 15 U/L (ref 15–41)
Albumin: 3.8 g/dL (ref 3.5–5.0)
Alkaline Phosphatase: 42 U/L (ref 38–126)
Anion gap: 8 (ref 5–15)
BUN: 24 mg/dL — ABNORMAL HIGH (ref 8–23)
CO2: 25 mmol/L (ref 22–32)
Calcium: 9.7 mg/dL (ref 8.9–10.3)
Chloride: 107 mmol/L (ref 98–111)
Creatinine: 0.79 mg/dL (ref 0.44–1.00)
GFR, Est AFR Am: >60 mL/min (ref >60)
GFR, Estimated: >60 mL/min (ref >60)
Glucose, Bld: 100 mg/dL — ABNORMAL HIGH (ref 70–99)
Potassium: 3.9 mmol/L (ref 3.5–5.1)
Sodium: 140 mmol/L (ref 135–145)
Total Bilirubin: 0.3 mg/dL (ref 0.3–1.2)
Total Protein: 7.8 g/dL (ref 6.5–8.1)

## 2019-12-14 MED ORDER — SODIUM CHLORIDE 0.9 % IV SOLN
INTRAVENOUS | Status: AC
  Filled 2019-12-14 (×2): qty 250

## 2019-12-14 MED ORDER — KETOROLAC TROMETHAMINE 15 MG/ML IJ SOLN
30.0000 mg | Freq: Once | INTRAMUSCULAR | Status: AC
  Administered 2019-12-14: 09:00:00 30 mg via INTRAVENOUS
  Filled 2019-12-14: qty 2

## 2019-12-14 MED ORDER — KETOROLAC TROMETHAMINE 15 MG/ML IJ SOLN
INTRAMUSCULAR | Status: AC
  Filled 2019-12-14: qty 2

## 2019-12-14 NOTE — Patient Instructions (Signed)
Dehydration, Adult Dehydration is condition in which there is not enough water or other fluids in the body. This happens when a person loses more fluids than he or she takes in. Important body parts cannot work right without the right amount of fluids. Any loss of fluids from the body can cause dehydration. Dehydration can be mild, worse, or very bad. It should be treated right away to keep it from getting very bad. What are the causes? This condition may be caused by:  Conditions that cause loss of water or other fluids, such as: ? Watery poop (diarrhea). ? Vomiting. ? Sweating a lot. ? Peeing (urinating) a lot.  Not drinking enough fluids, especially when you: ? Are ill. ? Are doing things that take a lot of energy to do.  Other illnesses and conditions, such as fever or infection.  Certain medicines, such as medicines that take extra fluid out of the body (diuretics).  Lack of safe drinking water.  Not being able to get enough water and food. What increases the risk? The following factors may make you more likely to develop this condition:  Having a long-term (chronic) illness that has not been treated the right way, such as: ? Diabetes. ? Heart disease. ? Kidney disease.  Being 65 years of age or older.  Having a disability.  Living in a place that is high above the ground or sea (high in altitude). The thinner, dried air causes more fluid loss.  Doing exercises that put stress on your body for a long time. What are the signs or symptoms? Symptoms of dehydration depend on how bad it is. Mild or worse dehydration  Thirst.  Dry lips or dry mouth.  Feeling dizzy or light-headed, especially when you stand up from sitting.  Muscle cramps.  Your body making: ? Dark pee (urine). Pee may be the color of tea. ? Less pee than normal. ? Less tears than normal.  Headache. Very bad dehydration  Changes in skin. Skin may: ? Be cold to the touch (clammy). ? Be blotchy  or pale. ? Not go back to normal right after you lightly pinch it and let it go.  Little or no tears, pee, or sweat.  Changes in vital signs, such as: ? Fast breathing. ? Low blood pressure. ? Weak pulse. ? Pulse that is more than 100 beats a minute when you are sitting still.  Other changes, such as: ? Feeling very thirsty. ? Eyes that look hollow (sunken). ? Cold hands and feet. ? Being mixed up (confused). ? Being very tired (lethargic) or having trouble waking from sleep. ? Short-term weight loss. ? Loss of consciousness. How is this treated? Treatment for this condition depends on how bad it is. Treatment should start right away. Do not wait until your condition gets very bad. Very bad dehydration is an emergency. You will need to go to a hospital.  Mild or worse dehydration can be treated at home. You may be asked to: ? Drink more fluids. ? Drink an oral rehydration solution (ORS). This drink helps get the right amounts of fluids and salts and minerals in the blood (electrolytes).  Very bad dehydration can be treated: ? With fluids through an IV tube. ? By getting normal levels of salts and minerals in your blood. This is often done by giving salts and minerals through a tube. The tube is passed through your nose and into your stomach. ? By treating the root cause. Follow these instructions at   home: Oral rehydration solution If told by your doctor, drink an ORS:  Make an ORS. Use instructions on the package.  Start by drinking small amounts, about  cup (120 mL) every 5-10 minutes.  Slowly drink more until you have had the amount that your doctor said to have. Eating and drinking         Drink enough clear fluid to keep your pee pale yellow. If you were told to drink an ORS, finish the ORS first. Then, start slowly drinking other clear fluids. Drink fluids such as: ? Water. Do not drink only water. Doing that can make the salt (sodium) level in your body get too  low. ? Water from ice chips you suck on. ? Fruit juice that you have added water to (diluted). ? Low-calorie sports drinks.  Eat foods that have the right amounts of salts and minerals, such as: ? Bananas. ? Oranges. ? Potatoes. ? Tomatoes. ? Spinach.  Do not drink alcohol.  Avoid: ? Drinks that have a lot of sugar. These include:  High-calorie sports drinks.  Fruit juice that you did not add water to.  Soda.  Caffeine. ? Foods that are greasy or have a lot of fat or sugar. General instructions  Take over-the-counter and prescription medicines only as told by your doctor.  Do not take salt tablets. Doing that can make the salt level in your body get too high.  Return to your normal activities as told by your doctor. Ask your doctor what activities are safe for you.  Keep all follow-up visits as told by your doctor. This is important. Contact a doctor if:  You have pain in your belly (abdomen) and the pain: ? Gets worse. ? Stays in one place.  You have a rash.  You have a stiff neck.  You get angry or annoyed (irritable) more easily than normal.  You are more tired or have a harder time waking than normal.  You feel: ? Weak or dizzy. ? Very thirsty. Get help right away if you have:  Any symptoms of very bad dehydration.  Symptoms of vomiting, such as: ? You cannot eat or drink without vomiting. ? Your vomiting gets worse or does not go away. ? Your vomit has blood or green stuff in it.  Symptoms that get worse with treatment.  A fever.  A very bad headache.  Problems with peeing or pooping (having a bowel movement), such as: ? Watery poop that gets worse or does not go away. ? Blood in your poop (stool). This may cause poop to look black and tarry. ? Not peeing in 6-8 hours. ? Peeing only a small amount of very dark pee in 6-8 hours.  Trouble breathing. These symptoms may be an emergency. Do not wait to see if the symptoms will go away. Get  medical help right away. Call your local emergency services (911 in the [place] Do not drive yourself to the hospital. Summary  Dehydration is a condition in which there is not enough water or other fluids in the body. This happens when a person loses more fluids than he or she takes in.  Treatment for this condition depends on how bad it is. Treatment should be started right away. Do not wait until your condition gets very bad.  Drink enough clear fluid to keep your pee pale yellow. If you were told to drink an oral rehydration solution (ORS), finish the ORS first. Then, start slowly drinking other clear fluids.  Take over-the-counter and prescription medicines only as told by your doctor.  Get help right away if you have any symptoms of very bad dehydration. This information is not intended to replace advice given to you by your health care provider. Make sure you discuss any questions you have with your health care provider. Document Revised: 06/08/2019 Document Reviewed: 06/08/2019 Elsevier Patient Education  [place] Headache Without Cause A headache is pain or discomfort that is felt around the head or neck area. There are many causes and types of headaches. In some cases, the cause may not be found. Follow these instructions at home: Watch your condition for any changes. Let your doctor know about them. Take these steps to help with your condition: Managing pain      Take over-the-counter and prescription medicines only as told by your doctor.  Lie down in a dark, quiet room when you have a headache.  If told, put ice on your head and neck area: ? Put ice in a plastic bag. ? Place a towel between your skin and the bag. ? Leave the ice on for 20 minutes, 2-3 times per day.  If told, put heat on the affected area. Use the heat source that your doctor recommends, such as a moist heat pack or a heating pad. ? Place a towel between your skin and the heat  source. ? Leave the heat on for 20-30 minutes. ? Remove the heat if your skin turns bright red. This is very important if you are unable to feel pain, heat, or cold. You may have a greater risk of getting burned.  Keep lights dim if bright lights bother you or make your headaches worse. Eating and drinking  Eat meals on a regular schedule.  If you drink alcohol: ? Limit how much you use to:  0-1 drink a day for women.  0-2 drinks a day for men. ? Be aware of how much alcohol is in your drink. In the [place] one drink equals one 12 oz bottle of beer (355 mL), one 5 oz glass of wine (148 mL), or one 1 oz glass of hard liquor (44 mL).  Stop drinking caffeine, or reduce how much caffeine you drink. General instructions   Keep a journal to find out if certain things bring on headaches. For example, write down: ? What you eat and drink. ? How much sleep you get. ? Any change to your diet or medicines.  Get a massage or try other ways to relax.  Limit stress.  Sit up straight. Do not tighten (tense) your muscles.  Do not use any products that contain nicotine or tobacco. This includes cigarettes, e-cigarettes, and chewing tobacco. If you need help quitting, ask your doctor.  Exercise regularly as told by your doctor.  Get enough sleep. This often means 7-9 hours of sleep each night.  Keep all follow-up visits as told by your doctor. This is important. Contact a doctor if:  Your symptoms are not helped by medicine.  You have a headache that feels different than the other headaches.  You feel sick to your stomach (nauseous) or you throw up (vomit).  You have a fever. Get help right away if:  Your headache gets very bad quickly.  Your headache gets worse after a lot of physical activity.  You keep throwing up.  You have a stiff neck.  You have trouble seeing.  You have trouble speaking.  You have pain in the eye or  ear.  Your muscles are weak or you lose muscle  control.  You lose your balance or have trouble walking.  You feel like you will pass out (faint) or you pass out.  You are mixed up (confused).  You have a seizure. Summary  A headache is pain or discomfort that is felt around the head or neck area.  There are many causes and types of headaches. In some cases, the cause may not be found.  Keep a journal to help find out what causes your headaches. Watch your condition for any changes. Let your doctor know about them.  Contact a doctor if you have a headache that is different from usual, or if your headache is not helped by medicine.  Get help right away if your headache gets very bad, you throw up, you have trouble seeing, you lose your balance, or you have a seizure. This information is not intended to replace advice given to you by your health care provider. Make sure you discuss any questions you have with your health care provider. Document Revised: 05/16/2018 Document Reviewed: 05/16/2018 Elsevier Patient Education  [place].

## 2019-12-14 NOTE — Progress Notes (Signed)
Hematology and Oncology Follow Up Visit  Tannie Eichmann [id number] 1956-10-10 64 y.o. 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 Standerfer is here today for follow-up.  Unfortunately, she still is having some headaches.  As such, I just am not convinced at all that this headache is from the daratumumab.  She has been on migraine medicine.  I think she sees the neurologist next week.  If her headaches do not get better, then I probably would get her back on daratumumab once we see that her myeloma is becoming more of a problem.  She complains of pain mostly on the right side of her head.  She thinks there might be some kind of nerve problem.  I know she needs to see a ENT.  She has had no nausea or vomiting.  Is been no change in bowel or bladder habits.  She has had no fever.  There is been no bleeding.  She has had no cough.  Overall, her performance status is ECOG 1.    Medications:  Allergies as of 12/14/2019      Reactions   Benazepril Anaphylaxis, Swelling   angioedema Throat and lip swelling   Ondansetron Hcl Hives   Redness and hives post IV admin on 07/05/17   Codeine Nausea And Vomiting   Morphine Hives   Redness and hives noted post IV admin on 07/05/17      Medication List       Accurate as of December 14, 2019  8:08 AM. If you have any questions, ask your nurse or doctor.        acetaminophen 325 MG tablet Commonly known as: TYLENOL Take 650 mg by mouth.   ACETAMINOPHEN-BUTALBITAL 50-325 MG Tabs Take 1 tablet by mouth 2 (two) times daily as needed.   albuterol 108 (90 Base) MCG/ACT inhaler Commonly known as: VENTOLIN HFA Inhale 2 puffs into the lungs every 6 (six) hours as  needed for wheezing or shortness of breath.   ALPRAZolam 0.25 MG tablet Commonly known as: Xanax Take 1-2 tabs (0.25mg -0.50mg ) 30-60 minutes before procedure. May repeat if needed.Do not drive.   amLODipine 10 MG tablet Commonly known as: NORVASC Take 1 tablet (10 mg total) by mouth 2 (two) times daily.   ARTIFICIAL TEARS OP Apply to eye.   aspirin EC 81 MG tablet Take 1 tablet (81 mg total) by mouth daily.   carvedilol 25 MG tablet Commonly known as: COREG Take 1 tablet (25 mg total) by mouth 2 (two) times daily with a meal.   cyclobenzaprine 10 MG tablet Commonly known as: FLEXERIL Take 10 mg by mouth as needed.   Emgality 120 MG/ML Soaj Generic drug: Galcanezumab-gnlm Inject 120 mg into the skin every 30 (thirty) days.   famciclovir 250 MG tablet Commonly known as: FAMVIR Take 1 tablet (250 mg total) by mouth daily.   famotidine 40 MG tablet Commonly known as: PEPCID Take 1 tablet (40 mg total) by mouth at bedtime.   fluticasone 50 MCG/ACT nasal spray Commonly known as: FLONASE Place 2 sprays into both nostrils daily. What changed:   when to take this  reasons to take this   furosemide 20 MG tablet Commonly known as:  LASIX Take 1 tablet (20 mg total) by mouth daily as needed for fluid or edema.   HYDROcodone-acetaminophen 5-325 MG tablet Commonly known as: NORCO/VICODIN Take 1 tablet by mouth every 4 (four) hours as needed for moderate pain or severe pain.   hyoscyamine 0.125 MG SL tablet Commonly known as: LEVSIN SL Place 1 tablet (0.125 mg total) under the tongue every 4 (four) hours as needed.   lidocaine 5 % Commonly known as: Lidoderm Place 1 patch onto the skin daily. Remove & Discard patch within 12 hours or as directed by MD   loratadine 10 MG tablet Commonly known as: CLARITIN Take 1 tablet (10 mg total) by mouth 2 (two) times daily as needed for allergies.   LORazepam 0.5 MG tablet Commonly known as: ATIVAN Take 1 tablet (0.5 mg total)  by mouth every 8 (eight) hours.   Melatonin 3 MG Caps Take by mouth.   metoCLOPramide 10 MG tablet Commonly known as: Reglan Take 1 tablet (10 mg total) by mouth 4 (four) times daily. For migraine or headache or nausea   montelukast 10 MG tablet Commonly known as: Singulair Take 1 tablet (10 mg total) by mouth at bedtime. Start taking Singulair on 11/16/2019.   MULTIVITAMIN ADULT PO Take by mouth.   Nurtec 75 MG Tbdp Generic drug: Rimegepant Sulfate Take 75 mg by mouth daily as needed. For migraines. Take as close to onset of migraine as possible. One daily maximum.   ondansetron 4 MG tablet Commonly known as: Zofran Take 1 tablet (4 mg total) by mouth every 8 (eight) hours as needed for nausea or vomiting.   pantoprazole 40 MG tablet Commonly known as: PROTONIX Take 1 tablet (40 mg total) by mouth 2 (two) times daily.   pomalidomide 2 MG capsule Commonly known as: POMALYST Take 1 capsule (2 mg total) by mouth daily. Every 21 days. Auth # [id number]   potassium chloride SA 20 MEQ tablet Commonly known as: KLOR-CON Take 2 tablets (40 mEq total) by mouth once for 1 dose.   predniSONE 10 MG tablet Commonly known as: DELTASONE 4 tab po day 1, 3 tab po day 2, 2 tab po day 3, 1 tab po day 4   Premarin vaginal cream Generic drug: conjugated estrogens Place vaginally.   prochlorperazine 10 MG tablet Commonly known as: COMPAZINE Take 1 tablet (10 mg total) by mouth every 6 (six) hours as needed (Nausea or vomiting).   promethazine 25 MG tablet Commonly known as: PHENERGAN Take 1 tablet (25 mg total) by mouth every 8 (eight) hours as needed for nausea or vomiting.   sucralfate 1 g tablet Commonly known as: Carafate Take 1 tablet (1 g total) by mouth 4 (four) times daily -  with meals and at bedtime.   SUMAtriptan 100 MG tablet Commonly known as: IMITREX 1 tab PO at headache onset, may repeat in 2 hrs as needed. No more than 2 tabs per day or 4 tabs per wk. (Can dispense  #9 if quality limit)   topiramate 50 MG tablet Commonly known as: Topamax Take 1 tablet (50 mg total) by mouth 2 (two) times daily.   triamterene-hydrochlorothiazide 37.5-25 MG tablet Commonly known as: MAXZIDE-25 Take 1 tablet by mouth daily.   Vitamin D-3 25 MCG (1000 UT) Caps Take by mouth.       Allergies:  Allergies  Allergen Reactions  . Benazepril Anaphylaxis and Swelling    angioedema Throat and lip swelling  . Ondansetron Hcl Hives    Redness  and hives post IV admin on 07/05/17  . Codeine Nausea And Vomiting  . Morphine Hives    Redness and hives noted post IV admin on 07/05/17    Past Medical History, Surgical history, Social history, and Family History were reviewed and updated.  Review of Systems: Review of Systems  Constitutional: Negative.   HENT: Negative.   Eyes: Negative.   Respiratory: Negative.   Cardiovascular: Negative.   Gastrointestinal: Negative.   Genitourinary: Negative.   Musculoskeletal: Negative.   Skin: Negative.   Neurological: Negative.   Endo/Heme/Allergies: Negative.   Psychiatric/Behavioral: Negative.       Physical Exam:  weight is 190 lb 12 oz (86.5 kg). Her tympanic temperature is 97.7 F (36.5 C). Her blood pressure is 132/84 and her pulse is 80. Her respiration is 20 and oxygen saturation is 100%.   Wt Readings from Last 3 Encounters:  12/14/19 190 lb 12 oz (86.5 kg)  12/07/19 189 lb 8 oz (86 kg)  11/29/19 194 lb 12.8 oz (88.4 kg)    Physical Exam Vitals reviewed.  HENT:     Head: Normocephalic and atraumatic.  Eyes:     Pupils: Pupils are equal, round, and reactive to light.  Cardiovascular:     Rate and Rhythm: Normal rate and regular rhythm.     Heart sounds: Normal heart sounds.  Pulmonary:     Effort: Pulmonary effort is normal.     Breath sounds: Normal breath sounds.  Abdominal:     General: Bowel sounds are normal.     Palpations: Abdomen is soft.  Musculoskeletal:        General: No tenderness  or deformity. Normal range of motion.     Cervical back: Normal range of motion.  Lymphadenopathy:     Cervical: No cervical adenopathy.  Skin:    General: Skin is warm and dry.     Findings: No erythema or rash.  Neurological:     Mental Status: She is alert and oriented to person, place, and time.  Psychiatric:        Behavior: Behavior normal.        Thought Content: Thought content normal.        Judgment: Judgment normal.      Lab Results  Component Value Date   WBC 3.4 (L) 12/14/2019   HGB 11.8 (L) 12/14/2019   HCT 36.7 12/14/2019   MCV 89.5 12/14/2019   PLT 158 12/14/2019   Lab Results  Component Value Date   FERRITIN 120 12/07/2019   IRON 82 12/07/2019   TIBC 348 12/07/2019   UIBC 266 12/07/2019   IRONPCTSAT 24 12/07/2019   Lab Results  Component Value Date   RETICCTPCT 0.8 07/16/2016   RBC 4.10 12/14/2019   RETICCTABS 32,640 07/16/2016   Lab Results  Component Value Date   KPAFRELGTCHN 31.2 (H) 12/07/2019   LAMBDASER 2.8 (L) 12/07/2019   KAPLAMBRATIO 11.14 (H) 12/07/2019   Lab Results  Component Value Date   IGGSERUM 2,301 (H) 12/07/2019   IGA 129 12/07/2019   IGMSERUM 88 12/07/2019   Lab Results  Component Value Date   TOTALPROTELP 8.2 12/07/2019   ALBUMINELP 3.6 12/07/2019   A1GS 0.2 12/07/2019   A2GS 1.1 (H) 12/07/2019   BETS 1.2 12/07/2019   BETA2SER 0.5 07/16/2016   GAMS 2.1 (H) 12/07/2019   MSPIKE 1.3 (H) 12/07/2019   SPEI Comment 12/07/2019     Chemistry      Component Value Date/Time   NA 140 12/07/2019 1024  NA 143 11/08/2017 1127   NA 140 12/31/2016 1000   K 3.6 12/07/2019 1024   K 4.0 11/08/2017 1127   K 3.6 12/31/2016 1000   CL 106 12/07/2019 1024   CL 107 11/08/2017 1127   CO2 26 12/07/2019 1024   CO2 25 11/08/2017 1127   CO2 24 12/31/2016 1000   BUN 17 12/07/2019 1024   BUN 19 11/08/2017 1127   BUN 22.3 12/31/2016 1000   CREATININE 0.78 12/07/2019 1024   CREATININE 0.9 11/08/2017 1127   CREATININE 0.8 12/31/2016  1000      Component Value Date/Time   CALCIUM 9.8 12/07/2019 1024   CALCIUM 9.9 11/08/2017 1127   CALCIUM 9.5 12/31/2016 1000   ALKPHOS 50 12/07/2019 1024   ALKPHOS 44 11/08/2017 1127   ALKPHOS 51 12/31/2016 1000   AST 14 (L) 12/07/2019 1024   AST 28 12/31/2016 1000   ALT 13 12/07/2019 1024   ALT 36 11/08/2017 1127   ALT 16 12/31/2016 1000   BILITOT 0.6 12/07/2019 1024   BILITOT 0.46 12/31/2016 1000       Impression and Plan: Ms. Fierstein is a very pleasant 64 yo African American female with a previous IgG kappa MGUS with progression to myeloma.  For right now, we we will continue to hold the daratumumab.  I think we have to.  Her quality of life is really being adversely affected.  I really do not want to see her miserable.  We will have to see what the neurologist says.  I will see her back in about 3-4 weeks.  Again, the headaches are not no better, then I will consider getting her back on the daratumumab.  This is still a little bit of a complicated problem.  I spent about 30 minutes with her today.  [name], MD 2/4/20218:08 AM

## 2019-12-14 NOTE — Telephone Encounter (Signed)
Appointment foe OV added to 2/25 visit that was previously scheduled per 2/4 los

## 2019-12-15 LAB — KAPPA/LAMBDA LIGHT CHAINS
Kappa free light chain: 34.5 mg/L — ABNORMAL HIGH (ref 3.3–19.4)
Kappa, lambda light chain ratio: 10.45 — ABNORMAL HIGH (ref 0.26–1.65)
Lambda free light chains: 3.3 mg/L — ABNORMAL LOW (ref 5.7–26.3)

## 2019-12-15 LAB — IGG, IGA, IGM
IgA: 101 mg/dL (ref 87–352)
IgG (Immunoglobin G), Serum: 2025 mg/dL — ABNORMAL HIGH (ref 586–1602)
IgM (Immunoglobulin M), Srm: 71 mg/dL (ref 26–217)

## 2019-12-18 ENCOUNTER — Telehealth: Payer: Self-pay | Admitting: *Deleted

## 2019-12-18 LAB — PROTEIN ELECTROPHORESIS, SERUM, WITH REFLEX
A/G Ratio: 0.8 (ref 0.7–1.7)
Albumin ELP: 3.3 g/dL (ref 2.9–4.4)
Alpha-1-Globulin: 0.2 g/dL (ref 0.0–0.4)
Alpha-2-Globulin: 1 g/dL (ref 0.4–1.0)
Beta Globulin: 1 g/dL (ref 0.7–1.3)
Gamma Globulin: 1.8 g/dL (ref 0.4–1.8)
Globulin, Total: 4.1 g/dL — ABNORMAL HIGH (ref 2.2–3.9)
M-Spike, %: 1.2 g/dL — ABNORMAL HIGH
SPEP Interpretation: 0
Total Protein ELP: 7.4 g/dL (ref 6.0–8.5)

## 2019-12-18 LAB — IMMUNOFIXATION REFLEX, SERUM
IgA: 111 mg/dL (ref 87–352)
IgG (Immunoglobin G), Serum: 2201 mg/dL — ABNORMAL HIGH (ref 586–1602)
IgM (Immunoglobulin M), Srm: 76 mg/dL (ref 26–217)

## 2019-12-18 NOTE — Telephone Encounter (Signed)
Pt notified per order of Dr. Marin Olp that "the myeloma is down to 1.2!!  We will still watch without any treatment!!"  Pt appreciative of call and would like to know if she needs to keep upcoming appt on 12/21/19 if her headache remains subsided and her treatment is on hold. Informed pt that I would speak with Dr. Marin Olp tomorrow and call her with his orders.  Pt appreciative of call and has no further questions or concerns at this time.

## 2019-12-18 NOTE — Telephone Encounter (Signed)
-----   Message from [name], MD sent at 12/18/2019  4:41 PM EST ----- Call - the myeloma is down to 1.2!!  We will still watch without any treatment!!  Tami [name]

## 2019-12-19 ENCOUNTER — Other Ambulatory Visit: Payer: Medicare Other

## 2019-12-19 ENCOUNTER — Ambulatory Visit: Payer: Medicare Other

## 2019-12-19 ENCOUNTER — Telehealth: Payer: Self-pay | Admitting: *Deleted

## 2019-12-19 DIAGNOSIS — M542 Cervicalgia: Secondary | ICD-10-CM | POA: Diagnosis not present

## 2019-12-19 DIAGNOSIS — J018 Other acute sinusitis: Secondary | ICD-10-CM | POA: Diagnosis not present

## 2019-12-19 DIAGNOSIS — G8929 Other chronic pain: Secondary | ICD-10-CM | POA: Diagnosis not present

## 2019-12-19 NOTE — Telephone Encounter (Signed)
Spoke with Tami [name].  Dr Marin Olp would like for Tami [name] to come in on Thursday to check labs and get IVF.  Tami [name] is happy about this as it seems to help her.

## 2019-12-20 ENCOUNTER — Other Ambulatory Visit: Payer: Self-pay | Admitting: *Deleted

## 2019-12-20 DIAGNOSIS — C9000 Multiple myeloma not having achieved remission: Secondary | ICD-10-CM

## 2019-12-21 ENCOUNTER — Inpatient Hospital Stay: Payer: Medicare Other

## 2019-12-21 ENCOUNTER — Other Ambulatory Visit: Payer: Self-pay

## 2019-12-21 VITALS — BP 131/70 | HR 81 | Resp 18 | Ht 67.0 in

## 2019-12-21 DIAGNOSIS — C9000 Multiple myeloma not having achieved remission: Secondary | ICD-10-CM | POA: Diagnosis not present

## 2019-12-21 DIAGNOSIS — R519 Headache, unspecified: Secondary | ICD-10-CM | POA: Diagnosis not present

## 2019-12-21 LAB — CBC WITH DIFFERENTIAL (CANCER CENTER ONLY)
Abs Immature Granulocytes: 0.01 10*3/uL (ref 0.00–0.07)
Basophils Absolute: 0 10*3/uL (ref 0.0–0.1)
Basophils Relative: 1 %
Eosinophils Absolute: 0 10*3/uL (ref 0.0–0.5)
Eosinophils Relative: 1 %
HCT: 36.7 % (ref 36.0–46.0)
Hemoglobin: 11.9 g/dL — ABNORMAL LOW (ref 12.0–15.0)
Immature Granulocytes: 0 %
Lymphocytes Relative: 29 %
Lymphs Abs: 1.3 10*3/uL (ref 0.7–4.0)
MCH: 29 pg (ref 26.0–34.0)
MCHC: 32.4 g/dL (ref 30.0–36.0)
MCV: 89.5 fL (ref 80.0–100.0)
Monocytes Absolute: 0.4 10*3/uL (ref 0.1–1.0)
Monocytes Relative: 8 %
Neutro Abs: 2.7 10*3/uL (ref 1.7–7.7)
Neutrophils Relative %: 61 %
Platelet Count: 151 10*3/uL (ref 150–400)
RBC: 4.1 MIL/uL (ref 3.87–5.11)
RDW: 14.2 % (ref 11.5–15.5)
WBC Count: 4.4 10*3/uL (ref 4.0–10.5)
nRBC: 0 % (ref 0.0–0.2)

## 2019-12-21 LAB — CMP (CANCER CENTER ONLY)
ALT: 12 U/L (ref 0–44)
AST: 17 U/L (ref 15–41)
Albumin: 3.8 g/dL (ref 3.5–5.0)
Alkaline Phosphatase: 46 U/L (ref 38–126)
Anion gap: 8 (ref 5–15)
BUN: 21 mg/dL (ref 8–23)
CO2: 27 mmol/L (ref 22–32)
Calcium: 9.2 mg/dL (ref 8.9–10.3)
Chloride: 109 mmol/L (ref 98–111)
Creatinine: 0.7 mg/dL (ref 0.44–1.00)
GFR, Est AFR Am: >60 mL/min (ref >60)
GFR, Estimated: >60 mL/min (ref >60)
Glucose, Bld: 80 mg/dL (ref 70–99)
Potassium: 3.4 mmol/L — ABNORMAL LOW (ref 3.5–5.1)
Sodium: 144 mmol/L (ref 135–145)
Total Bilirubin: 0.3 mg/dL (ref 0.3–1.2)
Total Protein: 7.5 g/dL (ref 6.5–8.1)

## 2019-12-21 MED ORDER — KETOROLAC TROMETHAMINE 15 MG/ML IJ SOLN
INTRAMUSCULAR | Status: AC
  Filled 2019-12-21: qty 2

## 2019-12-21 MED ORDER — KETOROLAC TROMETHAMINE 15 MG/ML IJ SOLN
30.0000 mg | Freq: Once | INTRAMUSCULAR | Status: AC
  Administered 2019-12-21: 30 mg via INTRAVENOUS
  Filled 2019-12-21: qty 2

## 2019-12-21 MED ORDER — SODIUM CHLORIDE 0.9 % IV SOLN
1000.0000 mL | INTRAVENOUS | Status: AC
  Administered 2019-12-21: 1000 mL via INTRAVENOUS
  Filled 2019-12-21 (×2): qty 1000

## 2019-12-21 NOTE — Patient Instructions (Signed)
Dehydration, Adult Dehydration is condition in which there is not enough water or other fluids in the body. This happens when a person loses more fluids than he or she takes in. Important body parts cannot work right without the right amount of fluids. Any loss of fluids from the body can cause dehydration. Dehydration can be mild, worse, or very bad. It should be treated right away to keep it from getting very bad. What are the causes? This condition may be caused by:  Conditions that cause loss of water or other fluids, such as: ? Watery poop (diarrhea). ? Vomiting. ? Sweating a lot. ? Peeing (urinating) a lot.  Not drinking enough fluids, especially when you: ? Are ill. ? Are doing things that take a lot of energy to do.  Other illnesses and conditions, such as fever or infection.  Certain medicines, such as medicines that take extra fluid out of the body (diuretics).  Lack of safe drinking water.  Not being able to get enough water and food. What increases the risk? The following factors may make you more likely to develop this condition:  Having a long-term (chronic) illness that has not been treated the right way, such as: ? Diabetes. ? Heart disease. ? Kidney disease.  Being 65 years of age or older.  Having a disability.  Living in a place that is high above the ground or sea (high in altitude). The thinner, dried air causes more fluid loss.  Doing exercises that put stress on your body for a long time. What are the signs or symptoms? Symptoms of dehydration depend on how bad it is. Mild or worse dehydration  Thirst.  Dry lips or dry mouth.  Feeling dizzy or light-headed, especially when you stand up from sitting.  Muscle cramps.  Your body making: ? Dark pee (urine). Pee may be the color of tea. ? Less pee than normal. ? Less tears than normal.  Headache. Very bad dehydration  Changes in skin. Skin may: ? Be cold to the touch (clammy). ? Be blotchy  or pale. ? Not go back to normal right after you lightly pinch it and let it go.  Little or no tears, pee, or sweat.  Changes in vital signs, such as: ? Fast breathing. ? Low blood pressure. ? Weak pulse. ? Pulse that is more than 100 beats a minute when you are sitting still.  Other changes, such as: ? Feeling very thirsty. ? Eyes that look hollow (sunken). ? Cold hands and feet. ? Being mixed up (confused). ? Being very tired (lethargic) or having trouble waking from sleep. ? Short-term weight loss. ? Loss of consciousness. How is this treated? Treatment for this condition depends on how bad it is. Treatment should start right away. Do not wait until your condition gets very bad. Very bad dehydration is an emergency. You will need to go to a hospital.  Mild or worse dehydration can be treated at home. You may be asked to: ? Drink more fluids. ? Drink an oral rehydration solution (ORS). This drink helps get the right amounts of fluids and salts and minerals in the blood (electrolytes).  Very bad dehydration can be treated: ? With fluids through an IV tube. ? By getting normal levels of salts and minerals in your blood. This is often done by giving salts and minerals through a tube. The tube is passed through your nose and into your stomach. ? By treating the root cause. Follow these instructions at   home: Oral rehydration solution If told by your doctor, drink an ORS:  Make an ORS. Use instructions on the package.  Start by drinking small amounts, about  cup (120 mL) every 5-10 minutes.  Slowly drink more until you have had the amount that your doctor said to have. Eating and drinking         Drink enough clear fluid to keep your pee pale yellow. If you were told to drink an ORS, finish the ORS first. Then, start slowly drinking other clear fluids. Drink fluids such as: ? Water. Do not drink only water. Doing that can make the salt (sodium) level in your body get too  low. ? Water from ice chips you suck on. ? Fruit juice that you have added water to (diluted). ? Low-calorie sports drinks.  Eat foods that have the right amounts of salts and minerals, such as: ? Bananas. ? Oranges. ? Potatoes. ? Tomatoes. ? Spinach.  Do not drink alcohol.  Avoid: ? Drinks that have a lot of sugar. These include:  High-calorie sports drinks.  Fruit juice that you did not add water to.  Soda.  Caffeine. ? Foods that are greasy or have a lot of fat or sugar. General instructions  Take over-the-counter and prescription medicines only as told by your doctor.  Do not take salt tablets. Doing that can make the salt level in your body get too high.  Return to your normal activities as told by your doctor. Ask your doctor what activities are safe for you.  Keep all follow-up visits as told by your doctor. This is important. Contact a doctor if:  You have pain in your belly (abdomen) and the pain: ? Gets worse. ? Stays in one place.  You have a rash.  You have a stiff neck.  You get angry or annoyed (irritable) more easily than normal.  You are more tired or have a harder time waking than normal.  You feel: ? Weak or dizzy. ? Very thirsty. Get help right away if you have:  Any symptoms of very bad dehydration.  Symptoms of vomiting, such as: ? You cannot eat or drink without vomiting. ? Your vomiting gets worse or does not go away. ? Your vomit has blood or green stuff in it.  Symptoms that get worse with treatment.  A fever.  A very bad headache.  Problems with peeing or pooping (having a bowel movement), such as: ? Watery poop that gets worse or does not go away. ? Blood in your poop (stool). This may cause poop to look black and tarry. ? Not peeing in 6-8 hours. ? Peeing only a small amount of very dark pee in 6-8 hours.  Trouble breathing. These symptoms may be an emergency. Do not wait to see if the symptoms will go away. Get  medical help right away. Call your local emergency services (911 in the [place] Do not drive yourself to the hospital. Summary  Dehydration is a condition in which there is not enough water or other fluids in the body. This happens when a person loses more fluids than he or she takes in.  Treatment for this condition depends on how bad it is. Treatment should be started right away. Do not wait until your condition gets very bad.  Drink enough clear fluid to keep your pee pale yellow. If you were told to drink an oral rehydration solution (ORS), finish the ORS first. Then, start slowly drinking other clear fluids.  Take over-the-counter and prescription medicines only as told by your doctor.  Get help right away if you have any symptoms of very bad dehydration. This information is not intended to replace advice given to you by your health care provider. Make sure you discuss any questions you have with your health care provider. Document Revised: 06/08/2019 Document Reviewed: 06/08/2019 Elsevier Patient Education  [place] Headache Without Cause A headache is pain or discomfort that is felt around the head or neck area. There are many causes and types of headaches. In some cases, the cause may not be found. Follow these instructions at home: Watch your condition for any changes. Let your doctor know about them. Take these steps to help with your condition: Managing pain      Take over-the-counter and prescription medicines only as told by your doctor.  Lie down in a dark, quiet room when you have a headache.  If told, put ice on your head and neck area: ? Put ice in a plastic bag. ? Place a towel between your skin and the bag. ? Leave the ice on for 20 minutes, 2-3 times per day.  If told, put heat on the affected area. Use the heat source that your doctor recommends, such as a moist heat pack or a heating pad. ? Place a towel between your skin and the heat  source. ? Leave the heat on for 20-30 minutes. ? Remove the heat if your skin turns bright red. This is very important if you are unable to feel pain, heat, or cold. You may have a greater risk of getting burned.  Keep lights dim if bright lights bother you or make your headaches worse. Eating and drinking  Eat meals on a regular schedule.  If you drink alcohol: ? Limit how much you use to:  0-1 drink a day for women.  0-2 drinks a day for men. ? Be aware of how much alcohol is in your drink. In the [place] one drink equals one 12 oz bottle of beer (355 mL), one 5 oz glass of wine (148 mL), or one 1 oz glass of hard liquor (44 mL).  Stop drinking caffeine, or reduce how much caffeine you drink. General instructions   Keep a journal to find out if certain things bring on headaches. For example, write down: ? What you eat and drink. ? How much sleep you get. ? Any change to your diet or medicines.  Get a massage or try other ways to relax.  Limit stress.  Sit up straight. Do not tighten (tense) your muscles.  Do not use any products that contain nicotine or tobacco. This includes cigarettes, e-cigarettes, and chewing tobacco. If you need help quitting, ask your doctor.  Exercise regularly as told by your doctor.  Get enough sleep. This often means 7-9 hours of sleep each night.  Keep all follow-up visits as told by your doctor. This is important. Contact a doctor if:  Your symptoms are not helped by medicine.  You have a headache that feels different than the other headaches.  You feel sick to your stomach (nauseous) or you throw up (vomit).  You have a fever. Get help right away if:  Your headache gets very bad quickly.  Your headache gets worse after a lot of physical activity.  You keep throwing up.  You have a stiff neck.  You have trouble seeing.  You have trouble speaking.  You have pain in the eye or  ear.  Your muscles are weak or you lose muscle  control.  You lose your balance or have trouble walking.  You feel like you will pass out (faint) or you pass out.  You are mixed up (confused).  You have a seizure. Summary  A headache is pain or discomfort that is felt around the head or neck area.  There are many causes and types of headaches. In some cases, the cause may not be found.  Keep a journal to help find out what causes your headaches. Watch your condition for any changes. Let your doctor know about them.  Contact a doctor if you have a headache that is different from usual, or if your headache is not helped by medicine.  Get help right away if your headache gets very bad, you throw up, you have trouble seeing, you lose your balance, or you have a seizure. This information is not intended to replace advice given to you by your health care provider. Make sure you discuss any questions you have with your health care provider. Document Revised: 05/16/2018 Document Reviewed: 05/16/2018 Elsevier Patient Education  [place].

## 2019-12-26 ENCOUNTER — Ambulatory Visit: Payer: Medicare Other

## 2019-12-26 ENCOUNTER — Other Ambulatory Visit: Payer: Medicare Other

## 2019-12-26 DIAGNOSIS — G43809 Other migraine, not intractable, without status migrainosus: Secondary | ICD-10-CM | POA: Diagnosis not present

## 2019-12-28 ENCOUNTER — Other Ambulatory Visit: Payer: Self-pay

## 2019-12-28 ENCOUNTER — Encounter: Payer: Self-pay | Admitting: Family Medicine

## 2019-12-28 ENCOUNTER — Ambulatory Visit: Payer: Medicare Other

## 2019-12-28 ENCOUNTER — Other Ambulatory Visit: Payer: Medicare Other

## 2019-12-28 ENCOUNTER — Ambulatory Visit (INDEPENDENT_AMBULATORY_CARE_PROVIDER_SITE_OTHER): Payer: Medicare Other | Admitting: Family Medicine

## 2019-12-28 VITALS — Wt 192.0 lb

## 2019-12-28 DIAGNOSIS — D649 Anemia, unspecified: Secondary | ICD-10-CM | POA: Diagnosis not present

## 2019-12-28 DIAGNOSIS — R739 Hyperglycemia, unspecified: Secondary | ICD-10-CM

## 2019-12-28 DIAGNOSIS — C9000 Multiple myeloma not having achieved remission: Secondary | ICD-10-CM

## 2019-12-28 DIAGNOSIS — R519 Headache, unspecified: Secondary | ICD-10-CM

## 2019-12-28 DIAGNOSIS — M1990 Unspecified osteoarthritis, unspecified site: Secondary | ICD-10-CM

## 2019-12-28 DIAGNOSIS — M62838 Other muscle spasm: Secondary | ICD-10-CM

## 2019-12-28 DIAGNOSIS — E785 Hyperlipidemia, unspecified: Secondary | ICD-10-CM | POA: Diagnosis not present

## 2019-12-28 DIAGNOSIS — G43711 Chronic migraine without aura, intractable, with status migrainosus: Secondary | ICD-10-CM

## 2019-12-28 DIAGNOSIS — I10 Essential (primary) hypertension: Secondary | ICD-10-CM | POA: Diagnosis not present

## 2019-12-28 DIAGNOSIS — G44209 Tension-type headache, unspecified, not intractable: Secondary | ICD-10-CM | POA: Diagnosis not present

## 2019-12-28 NOTE — Assessment & Plan Note (Signed)
Encouraged increased hydration, 64 ounces of clear fluids daily. Minimize alcohol and caffeine. Eat small frequent meals with lean proteins and complex carbs. Avoid high and low blood sugars. Get adequate sleep, 7-8 hours a night. Needs exercise daily preferably in the morning. Is referred back to neurology due to worsening and more persistent for consideration

## 2019-12-28 NOTE — Progress Notes (Signed)
Virtual Visit via phone Note  I connected with Tami [name] on 12/28/19 at 11:20 AM EST by a phone enabled telemedicine application and verified that I am speaking with the correct person using two identifiers.  Location: Patient: home Provider: home   I discussed the limitations of evaluation and management by telemedicine and the availability of in person appointments. The patient expressed understanding and agreed to proceed. Magdalene Molly, CMA was able to set up the visit, phone after being unable to set up video visit   Subjective:    [name], female    DOB: June 11, 1956, 64 y.o.   MRN: [id number]  Chief Complaint  Patient presents with  . Follow-up    HPI Patient is in today for follow up on chronic medical concerns. She had to stop her chemo a couple of weeks ago due to side effects. She continues to have increased neck pain and headaches. No falls or trauma. No other neurologic concerns. Denies CP/palp/SOB/HA/congestion/fevers/GI or GU c/o. Taking meds as prescribed  Past Medical History:  Diagnosis Date  . Abnormal SPEP 07/26/2016  . Arthritis    knees, hands  . Back pain 07/14/2016  . Bowel obstruction ([place]) 11/2014  . C. difficile diarrhea   . CHF (congestive heart failure) ([place])   . Colitis   . Congestive heart failure ([place]) 02/24/2015   S/p pacemaker  . Depression   . Elevated sed rate 08/15/2017  . Epigastric pain 03/22/2017  . GERD (gastroesophageal reflux disease)   . Heart disease   . Hyperlipidemia   . Hypertension   . 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 Low back pain 07/14/2016  . 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 Multiple myeloma ([place]) 07/20/2018  . Multiple myeloma not having achieved remission ([place]) 07/20/2018  . Myalgia 12/31/2016  . Obstructive sleep apnea 03/31/2015  . SOB (shortness of breath) 04/09/2016  . Stroke ([place])    TIAs  . UTI (urinary tract infection)     Past Surgical History:   Procedure Laterality Date  . ABDOMINAL HYSTERECTOMY     menorraghia, 2006, total  . CHOLECYSTECTOMY    . COLONOSCOPY  2018   [facility] per patient  . ESOPHAGOGASTRODUODENOSCOPY  2018   [facility]   . KNEE SURGERY     right, repair torn torn cartialage  . PACEMAKER INSERTION  08/2014  . TONSILLECTOMY    . TUBAL LIGATION      Family History  Problem Relation Age of Onset  . Diabetes Mother   . Hypertension Mother   . Heart disease Mother        s/p 1 stent  . Hyperlipidemia Mother   . Arthritis Mother   . Cancer Father        COLON  . Colon cancer Father 21  . Irritable bowel syndrome Sister   . Hyperlipidemia Daughter   . Hypertension Daughter   . Hypertension Maternal Grandmother   . Arthritis Maternal Grandmother   . Heart disease Maternal Grandfather        MI  . Hypertension Maternal Grandfather   . Arthritis Maternal Grandfather   . Hypertension Son   . Esophageal cancer Neg Hx     Social History   Socioeconomic History  . Marital status: Divorced    Spouse name: [name]  . Number of children: 3  . Years of education: [name]  . Highest education level: High school graduate  Occupational History  . [name]  Tobacco Use  . Smoking status: Never Smoker  . Smokeless tobacco: Never Used  Substance and Sexual Activity  . Alcohol use: No  . Drug use: No  . Sexual activity: [name]  Other Topics Concern  . [name]  Social History Narrative   Lives at home alone   Retired   Caffeine: coffee   Social Determinants of Health   Financial Resource Strain:   . Difficulty of Paying Living Expenses: [name]  Food Insecurity:   . Worried About Charity fundraiser in the Last Year: [name]  . Ran Out of Food in the Last Year: [name]  Transportation Needs:   . Lack of Transportation (Medical): [name]  . Lack of Transportation (Non-Medical): [name]  Physical Activity:   . Days of Exercise  per Week: [name]  . Minutes of Exercise per Session: [name]  Stress:   . Feeling of Stress : [name]  Social Connections:   . Frequency of Communication with Friends and Family: [name]  . Frequency of Social Gatherings with Friends and Family: [name]  . Attends Religious Services: [name]  . Active Member of Clubs or Organizations: [name]  . Attends Archivist Meetings: [name]  . Marital Status: [name]  Intimate Partner Violence:   . Fear of Current or Ex-Partner: [name]  . Emotionally Abused: [name]  . Physically Abused: [name]  . Sexually Abused: [name]    Outpatient Medications Prior to Visit  Medication Sig Dispense Refill  . acetaminophen (TYLENOL) 325 MG tablet Take 650 mg by mouth.    . ACETAMINOPHEN-BUTALBITAL 50-325 MG TABS Take 1 tablet by mouth 2 (two) times daily as needed. 120 tablet 1  . albuterol (PROVENTIL HFA;VENTOLIN HFA) 108 (90 Base) MCG/ACT inhaler Inhale 2 puffs into the lungs every 6 (six) hours as needed for wheezing or shortness of breath. 1 Inhaler 2  . ALPRAZolam (XANAX) 0.25 MG tablet Take 1-2 tabs (0.'25mg'$ -0.'50mg'$ ) 30-60 minutes before procedure. May repeat if needed.Do not drive. 4 tablet 0  . amLODipine (NORVASC) 10 MG tablet Take 1 tablet (10 mg total) by mouth 2 (two) times daily. 60 tablet 3  . aspirin EC 81 MG tablet Take 1 tablet (81 mg total) by mouth daily. 90 tablet 1  . Carboxymethylcellulose Sodium (ARTIFICIAL TEARS OP) Apply to eye.    . carvedilol (COREG) 25 MG tablet Take 1 tablet (25 mg total) by mouth 2 (two) times daily with a meal. 180 tablet 1  . Cholecalciferol (VITAMIN D-3) 25 MCG (1000 UT) CAPS Take by mouth.    . conjugated estrogens (PREMARIN) vaginal cream Place vaginally.    . cyclobenzaprine (FLEXERIL) 10 MG tablet Take 10 mg by mouth as needed.     . famciclovir (FAMVIR) 250 MG tablet Take 1 tablet (250 mg total) by mouth daily. 30 tablet 12  . famotidine  (PEPCID) 40 MG tablet Take 1 tablet (40 mg total) by mouth at bedtime. 30 tablet 5  . fluticasone (FLONASE) 50 MCG/ACT nasal spray Place 2 sprays into both nostrils daily. (Patient taking differently: Place 2 sprays into both nostrils as needed. ) 16 g 1  . furosemide (LASIX) 20 MG tablet Take 1 tablet (20 mg total) by mouth daily as needed for fluid or edema. 30 tablet 2  . Galcanezumab-gnlm (EMGALITY) 120  MG/ML SOAJ Inject 120 mg into the skin every 30 (thirty) days. 1 pen 11  . HYDROcodone-acetaminophen (NORCO/VICODIN) 5-325 MG tablet Take 1 tablet by mouth every 4 (four) hours as needed for moderate pain or severe pain. 20 tablet 0  . hyoscyamine (LEVSIN SL) 0.125 MG SL tablet Place 1 tablet (0.125 mg total) under the tongue every 4 (four) hours as needed. 30 tablet 2  . lidocaine (LIDODERM) 5 % Place 1 patch onto the skin daily. Remove & Discard patch within 12 hours or as directed by MD 30 patch 2  . loratadine (CLARITIN) 10 MG tablet Take 1 tablet (10 mg total) by mouth 2 (two) times daily as needed for allergies. 30 tablet 11  . LORazepam (ATIVAN) 0.5 MG tablet Take 1 tablet (0.5 mg total) by mouth every 8 (eight) hours. 30 tablet 0  . Melatonin 3 MG CAPS Take by mouth.    . metoCLOPramide (REGLAN) 10 MG tablet Take 1 tablet (10 mg total) by mouth 4 (four) times daily. For migraine or headache or nausea 30 tablet 6  . montelukast (SINGULAIR) 10 MG tablet Take 1 tablet (10 mg total) by mouth at bedtime. Start taking Singulair on 11/16/2019. 20 tablet 0  . Multiple Vitamins-Minerals (MULTIVITAMIN ADULT PO) Take by mouth.    . ondansetron (ZOFRAN) 4 MG tablet Take 1 tablet (4 mg total) by mouth every 8 (eight) hours as needed for nausea or vomiting. 30 tablet 1  . pantoprazole (PROTONIX) 40 MG tablet Take 1 tablet (40 mg total) by mouth 2 (two) times daily. 180 tablet 1  . pomalidomide (POMALYST) 2 MG capsule Take 1 capsule (2 mg total) by mouth daily. Every 21 days. Auth # [id number] 21 capsule 0   . predniSONE (DELTASONE) 10 MG tablet 4 tab po day 1, 3 tab po day 2, 2 tab po day 3, 1 tab po day 4 10 tablet 0  . prochlorperazine (COMPAZINE) 10 MG tablet Take 1 tablet (10 mg total) by mouth every 6 (six) hours as needed (Nausea or vomiting). 30 tablet 1  . promethazine (PHENERGAN) 25 MG tablet Take 1 tablet (25 mg total) by mouth every 8 (eight) hours as needed for nausea or vomiting. 30 tablet 2  . Rimegepant Sulfate (NURTEC) 75 MG TBDP Take 75 mg by mouth daily as needed. For migraines. Take as close to onset of migraine as possible. One daily maximum. 10 tablet 6  . sucralfate (CARAFATE) 1 g tablet Take 1 tablet (1 g total) by mouth 4 (four) times daily -  with meals and at bedtime. 120 tablet 0  . SUMAtriptan (IMITREX) 100 MG tablet 1 tab PO at headache onset, may repeat in 2 hrs as needed. No more than 2 tabs per day or 4 tabs per wk. (Can dispense #9 if quality limit)    . topiramate (TOPAMAX) 50 MG tablet Take 1 tablet (50 mg total) by mouth 2 (two) times daily. 60 tablet 3  . triamterene-hydrochlorothiazide (MAXZIDE-25) 37.5-25 MG tablet Take 1 tablet by mouth daily.    . potassium chloride SA (K-DUR) 20 MEQ tablet Take 2 tablets (40 mEq total) by mouth once for 1 dose. 60 tablet 3   No facility-administered medications prior to visit.    Allergies  Allergen Reactions  . Benazepril Anaphylaxis and Swelling    angioedema Throat and lip swelling  . Ondansetron Hcl Hives    Redness and hives post IV admin on 07/05/17  . Codeine Nausea And Vomiting  . Morphine Hives  Redness and hives noted post IV admin on 07/05/17    Review of Systems  Constitutional: Positive for malaise/fatigue. Negative for fever.  HENT: Negative for congestion.   Eyes: Negative for blurred vision.  Respiratory: Negative for shortness of breath.   Cardiovascular: Negative for chest pain, palpitations and leg swelling.  Gastrointestinal: Negative for abdominal pain, blood in stool and nausea.   Genitourinary: Negative for dysuria and frequency.  Musculoskeletal: Positive for myalgias and neck pain. Negative for falls.  Skin: Negative for rash.  Neurological: Positive for headaches. Negative for dizziness and loss of consciousness.  Endo/Heme/Allergies: Negative for environmental allergies.  Psychiatric/Behavioral: Negative for depression. The patient is nervous/anxious.        Objective:    Physical Exam unable to obtain via phone  Wt 192 lb (87.1 kg)   BMI 30.07 kg/m  Wt Readings from Last 3 Encounters:  12/28/19 192 lb (87.1 kg)  12/14/19 190 lb 12 oz (86.5 kg)  12/07/19 189 lb 8 oz (86 kg)    Diabetic Foot Exam - Simple   No data filed     Lab Results  Component Value Date   WBC 4.4 12/21/2019   HGB 11.9 (L) 12/21/2019   HCT 36.7 12/21/2019   PLT 151 12/21/2019   GLUCOSE 80 12/21/2019   CHOL 200 07/13/2019   TRIG 146.0 07/13/2019   HDL 66.00 07/13/2019   LDLCALC 104 (H) 07/13/2019   ALT 12 12/21/2019   AST 17 12/21/2019   NA 144 12/21/2019   K 3.4 (L) 12/21/2019   CL 109 12/21/2019   CREATININE 0.70 12/21/2019   BUN 21 12/21/2019   CO2 27 12/21/2019   TSH 0.93 07/13/2019   INR 0.90 07/28/2018   HGBA1C 5.5 08/30/2019    Lab Results  Component Value Date   TSH 0.93 07/13/2019   Lab Results  Component Value Date   WBC 4.4 12/21/2019   HGB 11.9 (L) 12/21/2019   HCT 36.7 12/21/2019   MCV 89.5 12/21/2019   PLT 151 12/21/2019   Lab Results  Component Value Date   NA 144 12/21/2019   K 3.4 (L) 12/21/2019   CHLORIDE 108 12/31/2016   CO2 27 12/21/2019   GLUCOSE 80 12/21/2019   BUN 21 12/21/2019   CREATININE 0.70 12/21/2019   BILITOT 0.3 12/21/2019   ALKPHOS 46 12/21/2019   AST 17 12/21/2019   ALT 12 12/21/2019   PROT 7.5 12/21/2019   ALBUMIN 3.8 12/21/2019   CALCIUM 9.2 12/21/2019   ANIONGAP 8 12/21/2019   EGFR >90 12/31/2016   GFR 76.01 07/13/2019   Lab Results  Component Value Date   CHOL 200 07/13/2019   Lab Results   Component Value Date   HDL 66.00 07/13/2019   Lab Results  Component Value Date   LDLCALC 104 (H) 07/13/2019   Lab Results  Component Value Date   TRIG 146.0 07/13/2019   Lab Results  Component Value Date   CHOLHDL 3 07/13/2019   Lab Results  Component Value Date   HGBA1C 5.5 08/30/2019       Assessment & Plan:   Problem List Items Addressed This Visit    Multiple myeloma not having achieved remission ([place]) (Chronic)    They stopped her chemo 3 weeks ago due to side effects and she notes only mild improvement       Arthritis    Notes increased neck pain recently Encouraged moist heat and gentle stretching as tolerated. May try NSAIDs and prescription meds as directed  and report if symptoms worsen or seek immediate care      Hyperlipidemia    Encouraged heart healthy diet, increase exercise, avoid trans fats, consider a krill oil cap daily      Relevant Orders   Comprehensive metabolic panel   Lipid panel   US Carotid Bilateral   Hypertension - Primary    Monitor and report any concerns, no changes to meds. Encouraged heart healthy diet such as the DASH diet and exercise as tolerated.       Relevant Orders   TSH   US Carotid Bilateral   Ambulatory referral to Neurology   Muscle spasms of neck   Relevant Orders   US Carotid Bilateral   Ambulatory referral to Neurology   Tension headache   Relevant Orders   US Carotid Bilateral   Ambulatory referral to Neurology   Anemia   Relevant Orders   CBC   Headache    Encouraged increased hydration, 64 ounces of clear fluids daily. Minimize alcohol and caffeine. Eat small frequent meals with lean proteins and complex carbs. Avoid high and low blood sugars. Get adequate sleep, 7-8 hours a night. Needs exercise daily preferably in the morning. Is referred back to neurology due to worsening and more persistent for consideration      Chronic migraine without aura, with intractable migraine, so stated, with status  migrainosus   Relevant Orders   Ambulatory referral to Neurology    Other Visit Diagnoses    Hyperglycemia       Relevant Orders   Hemoglobin A1c   US Carotid Bilateral   Ambulatory referral to Neurology      I am having Gershon Mussel maintain her conjugated estrogens, acetaminophen, pantoprazole, aspirin EC, ondansetron, lidocaine, sucralfate, famciclovir, hyoscyamine, albuterol, cyclobenzaprine, Multiple Vitamins-Minerals (MULTIVITAMIN ADULT PO), HYDROcodone-acetaminophen, carvedilol, topiramate, fluticasone, potassium chloride SA, ACETAMINOPHEN-BUTALBITAL, furosemide, Carboxymethylcellulose Sodium (ARTIFICIAL TEARS OP), metoCLOPramide, ALPRAZolam, loratadine, promethazine, triamterene-hydrochlorothiazide, SUMAtriptan, Emgality, Nurtec, montelukast, amLODipine, famotidine, pomalidomide, Vitamin D-3, Melatonin, predniSONE, prochlorperazine, and LORazepam.  No orders of the defined types were placed in this encounter.    I discussed the assessment and treatment plan with the patient. The patient was provided an opportunity to ask questions and all were answered. The patient agreed with the plan and demonstrated an understanding of the instructions.   The patient was advised to call back or seek an in-person evaluation if the symptoms worsen or if the condition fails to improve as anticipated.  I provided 25 minutes of non-face-to-face time during this encounter.   Penni Homans, MD

## 2019-12-28 NOTE — Assessment & Plan Note (Signed)
Monitor and report any concerns, no changes to meds. Encouraged heart healthy diet such as the DASH diet and exercise as tolerated.  ?

## 2019-12-28 NOTE — Assessment & Plan Note (Signed)
They stopped her chemo 3 weeks ago due to side effects and she notes only mild improvement

## 2019-12-28 NOTE — Assessment & Plan Note (Signed)
Notes increased neck pain recently Encouraged moist heat and gentle stretching as tolerated. May try NSAIDs and prescription meds as directed and report if symptoms worsen or seek immediate care

## 2019-12-28 NOTE — Assessment & Plan Note (Signed)
Encouraged heart healthy diet, increase exercise, avoid trans fats, consider a krill oil cap daily 

## 2020-01-01 ENCOUNTER — Ambulatory Visit (HOSPITAL_BASED_OUTPATIENT_CLINIC_OR_DEPARTMENT_OTHER)
Admission: RE | Admit: 2020-01-01 | Discharge: 2020-01-01 | Disposition: A | Discharge status: Home / Self Care | Payer: Medicare Other | Source: Ambulatory Visit | Attending: Family Medicine | Admitting: Family Medicine

## 2020-01-01 ENCOUNTER — Other Ambulatory Visit: Payer: Self-pay

## 2020-01-01 DIAGNOSIS — I6523 Occlusion and stenosis of bilateral carotid arteries: Secondary | ICD-10-CM | POA: Diagnosis not present

## 2020-01-01 DIAGNOSIS — E785 Hyperlipidemia, unspecified: Secondary | ICD-10-CM | POA: Diagnosis not present

## 2020-01-01 DIAGNOSIS — G44209 Tension-type headache, unspecified, not intractable: Secondary | ICD-10-CM | POA: Diagnosis not present

## 2020-01-01 DIAGNOSIS — M62838 Other muscle spasm: Secondary | ICD-10-CM | POA: Diagnosis not present

## 2020-01-01 DIAGNOSIS — R739 Hyperglycemia, unspecified: Secondary | ICD-10-CM | POA: Diagnosis not present

## 2020-01-01 DIAGNOSIS — I10 Essential (primary) hypertension: Secondary | ICD-10-CM

## 2020-01-02 ENCOUNTER — Telehealth: Payer: Self-pay | Admitting: Family Medicine

## 2020-01-02 ENCOUNTER — Other Ambulatory Visit: Payer: Self-pay | Admitting: Family Medicine

## 2020-01-02 ENCOUNTER — Other Ambulatory Visit: Payer: Medicare Other

## 2020-01-02 ENCOUNTER — Ambulatory Visit: Payer: Medicare Other

## 2020-01-02 MED ORDER — FAMOTIDINE 40 MG/5ML PO SUSR: 40.0000 mg | Freq: Every day | ORAL | 5 refills | Status: DC

## 2020-01-02 NOTE — Telephone Encounter (Signed)
Patient needs her famotidine refilled--But the last refill she had was in a liquid form and I saw only tablets on list

## 2020-01-02 NOTE — Telephone Encounter (Signed)
I sent in the liquid Famotidine for her. She must have gotten liquid elsewhere in the past but it is not a concern to switch to that

## 2020-01-03 NOTE — Telephone Encounter (Signed)
Called informed the patient refill requested has been done.

## 2020-01-04 ENCOUNTER — Inpatient Hospital Stay: Payer: Medicare Other

## 2020-01-04 ENCOUNTER — Other Ambulatory Visit: Payer: Self-pay

## 2020-01-04 ENCOUNTER — Encounter: Payer: Self-pay | Admitting: Hematology & Oncology

## 2020-01-04 ENCOUNTER — Other Ambulatory Visit: Payer: Medicare Other

## 2020-01-04 ENCOUNTER — Inpatient Hospital Stay (HOSPITAL_BASED_OUTPATIENT_CLINIC_OR_DEPARTMENT_OTHER): Payer: Medicare Other | Admitting: Hematology & Oncology

## 2020-01-04 VITALS — BP 141/90 | HR 82 | Temp 97.5°F | Resp 19 | Wt 190.0 lb

## 2020-01-04 DIAGNOSIS — H2513 Age-related nuclear cataract, bilateral: Secondary | ICD-10-CM | POA: Diagnosis not present

## 2020-01-04 DIAGNOSIS — C9000 Multiple myeloma not having achieved remission: Secondary | ICD-10-CM

## 2020-01-04 DIAGNOSIS — H02201 Unspecified lagophthalmos right upper eyelid: Secondary | ICD-10-CM | POA: Diagnosis not present

## 2020-01-04 DIAGNOSIS — H35033 Hypertensive retinopathy, bilateral: Secondary | ICD-10-CM | POA: Diagnosis not present

## 2020-01-04 DIAGNOSIS — R519 Headache, unspecified: Secondary | ICD-10-CM | POA: Diagnosis not present

## 2020-01-04 DIAGNOSIS — H353131 Nonexudative age-related macular degeneration, bilateral, early dry stage: Secondary | ICD-10-CM | POA: Diagnosis not present

## 2020-01-04 LAB — CMP (CANCER CENTER ONLY)
ALT: 12 U/L (ref 0–44)
AST: 17 U/L (ref 15–41)
Albumin: 4 g/dL (ref 3.5–5.0)
Alkaline Phosphatase: 46 U/L (ref 38–126)
Anion gap: 8 (ref 5–15)
BUN: 14 mg/dL (ref 8–23)
CO2: 26 mmol/L (ref 22–32)
Calcium: 9.5 mg/dL (ref 8.9–10.3)
Chloride: 107 mmol/L (ref 98–111)
Creatinine: 0.72 mg/dL (ref 0.44–1.00)
GFR, Est AFR Am: >60 mL/min (ref >60)
GFR, Estimated: >60 mL/min (ref >60)
Glucose, Bld: 102 mg/dL — ABNORMAL HIGH (ref 70–99)
Potassium: 3.8 mmol/L (ref 3.5–5.1)
Sodium: 141 mmol/L (ref 135–145)
Total Bilirubin: 0.5 mg/dL (ref 0.3–1.2)
Total Protein: 8.1 g/dL (ref 6.5–8.1)

## 2020-01-04 LAB — CBC WITH DIFFERENTIAL (CANCER CENTER ONLY)
Abs Immature Granulocytes: 0.02 10*3/uL (ref 0.00–0.07)
Basophils Absolute: 0 10*3/uL (ref 0.0–0.1)
Basophils Relative: 1 %
Eosinophils Absolute: 0.1 10*3/uL (ref 0.0–0.5)
Eosinophils Relative: 1 %
HCT: 38.6 % (ref 36.0–46.0)
Hemoglobin: 12.5 g/dL (ref 12.0–15.0)
Immature Granulocytes: 1 %
Lymphocytes Relative: 20 %
Lymphs Abs: 0.7 10*3/uL (ref 0.7–4.0)
MCH: 28.9 pg (ref 26.0–34.0)
MCHC: 32.4 g/dL (ref 30.0–36.0)
MCV: 89.1 fL (ref 80.0–100.0)
Monocytes Absolute: 0.3 10*3/uL (ref 0.1–1.0)
Monocytes Relative: 9 %
Neutro Abs: 2.5 10*3/uL (ref 1.7–7.7)
Neutrophils Relative %: 68 %
Platelet Count: 158 10*3/uL (ref 150–400)
RBC: 4.33 MIL/uL (ref 3.87–5.11)
RDW: 13.9 % (ref 11.5–15.5)
WBC Count: 3.6 10*3/uL — ABNORMAL LOW (ref 4.0–10.5)
nRBC: 0 % (ref 0.0–0.2)

## 2020-01-04 NOTE — Progress Notes (Signed)
Hematology and Oncology Follow Up Visit  Tami [name] [id number] [birth year] 64 y.o. 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  Tami [name] is doing much better.  Tami [name] has no more headaches.  Tami [name] now is on a migraine medication.  Tami [name] doctor gave Tami [name] some free samples.  I am so happy that Tami [name] quality life is doing much better now.  Tami [name] can do much more now.  Tami [name] is able to enjoy the nice weather that we are having.  As far as Tami [name] myeloma is concerned, everything is looking pretty stable.  Tami [name] last M spike was 1.2 g/dL.  Tami [name] has had no problems with Tami [name] bowels or bladder.  There is no nausea or vomiting.  Tami [name] has had no diarrhea or constipation.  There is no bleeding.  There is no leg swelling.  Overall, Tami [name] performance status is ECOG 0.     Medications:  Allergies as of 01/04/2020      Reactions   Benazepril Anaphylaxis, Swelling   angioedema Throat and lip swelling   Ondansetron Hcl Hives   Redness and hives post IV admin on 07/05/17   Codeine Nausea And Vomiting   Morphine Hives   Redness and hives noted post IV admin on 07/05/17      Medication List       Accurate as of January 04, 2020  8:14 AM. If you have any questions, ask your nurse or doctor.        acetaminophen 325 MG tablet Commonly known as: TYLENOL Take 650 mg by mouth.   ACETAMINOPHEN-BUTALBITAL 50-325 MG Tabs Take 1 tablet by mouth 2 (two) times daily as needed.   albuterol 108 (90 Base) MCG/ACT inhaler Commonly known as: VENTOLIN HFA Inhale 2 puffs into the lungs every 6 (six) hours as needed for wheezing or shortness of breath.   ALPRAZolam 0.25 MG tablet Commonly known as:  Xanax Take 1-2 tabs (0.25mg -0.50mg ) 30-60 minutes before procedure. May repeat if needed.Do not drive.   amLODipine 10 MG tablet Commonly known as: NORVASC Take 1 tablet (10 mg total) by mouth 2 (two) times daily.   ARTIFICIAL TEARS OP Apply to eye.   aspirin EC 81 MG tablet Take 1 tablet (81 mg total) by mouth daily.   carvedilol 25 MG tablet Commonly known as: COREG Take 1 tablet (25 mg total) by mouth 2 (two) times daily with a meal.   cyclobenzaprine 10 MG tablet Commonly known as: FLEXERIL Take 10 mg by mouth as needed.   Emgality 120 MG/ML Soaj Generic drug: Galcanezumab-gnlm Inject 120 mg into the skin every 30 (thirty) days.   famciclovir 250 MG tablet Commonly known as: FAMVIR Take 1 tablet (250 mg total) by mouth daily.   famotidine 40 MG/5ML suspension Commonly known as: PEPCID Take 5 mLs (40 mg total) by mouth at bedtime.   fluticasone 50 MCG/ACT nasal spray Commonly known as: FLONASE Place 2 sprays into both nostrils daily. What changed:   when to take this  reasons to take this   furosemide 20 MG tablet Commonly known as: LASIX Take 1 tablet (20 mg total) by mouth daily as needed for fluid or edema.  HYDROcodone-acetaminophen 5-325 MG tablet Commonly known as: NORCO/VICODIN Take 1 tablet by mouth every 4 (four) hours as needed for moderate pain or severe pain.   hyoscyamine 0.125 MG SL tablet Commonly known as: LEVSIN SL Place 1 tablet (0.125 mg total) under the tongue every 4 (four) hours as needed.   lidocaine 5 % Commonly known as: Lidoderm Place 1 patch onto the skin daily. Remove & Discard patch within 12 hours or as directed by MD   loratadine 10 MG tablet Commonly known as: CLARITIN Take 1 tablet (10 mg total) by mouth 2 (two) times daily as needed for allergies.   LORazepam 0.5 MG tablet Commonly known as: ATIVAN Take 1 tablet (0.5 mg total) by mouth every 8 (eight) hours.   Melatonin 3 MG Caps Take by mouth.   metoCLOPramide  10 MG tablet Commonly known as: Reglan Take 1 tablet (10 mg total) by mouth 4 (four) times daily. For migraine or headache or nausea   montelukast 10 MG tablet Commonly known as: Singulair Take 1 tablet (10 mg total) by mouth at bedtime. Start taking Singulair on 11/16/2019.   MULTIVITAMIN ADULT PO Take by mouth.   Nurtec 75 MG Tbdp Generic drug: Rimegepant Sulfate Take 75 mg by mouth daily as needed. For migraines. Take as close to onset of migraine as possible. One daily maximum.   ondansetron 4 MG tablet Commonly known as: Zofran Take 1 tablet (4 mg total) by mouth every 8 (eight) hours as needed for nausea or vomiting.   pantoprazole 40 MG tablet Commonly known as: PROTONIX Take 1 tablet (40 mg total) by mouth 2 (two) times daily.   pomalidomide 2 MG capsule Commonly known as: POMALYST Take 1 capsule (2 mg total) by mouth daily. Every 21 days. Auth # [id number]   potassium chloride SA 20 MEQ tablet Commonly known as: KLOR-CON Take 2 tablets (40 mEq total) by mouth once for 1 dose.   predniSONE 10 MG tablet Commonly known as: DELTASONE 4 tab po day 1, 3 tab po day 2, 2 tab po day 3, 1 tab po day 4   Premarin vaginal cream Generic drug: conjugated estrogens Place vaginally.   prochlorperazine 10 MG tablet Commonly known as: COMPAZINE Take 1 tablet (10 mg total) by mouth every 6 (six) hours as needed (Nausea or vomiting).   promethazine 25 MG tablet Commonly known as: PHENERGAN Take 1 tablet (25 mg total) by mouth every 8 (eight) hours as needed for nausea or vomiting.   sucralfate 1 g tablet Commonly known as: Carafate Take 1 tablet (1 g total) by mouth 4 (four) times daily -  with meals and at bedtime.   SUMAtriptan 100 MG tablet Commonly known as: IMITREX 1 tab PO at headache onset, may repeat in 2 hrs as needed. No more than 2 tabs per day or 4 tabs per wk. (Can dispense #9 if quality limit)   topiramate 50 MG tablet Commonly known as: Topamax Take 1 tablet  (50 mg total) by mouth 2 (two) times daily.   triamterene-hydrochlorothiazide 37.5-25 MG tablet Commonly known as: MAXZIDE-25 Take 1 tablet by mouth daily.   Vitamin D-3 25 MCG (1000 UT) Caps Take by mouth.       Allergies:  Allergies  Allergen Reactions  . Benazepril Anaphylaxis and Swelling    angioedema Throat and lip swelling  . Ondansetron Hcl Hives    Redness and hives post IV admin on 07/05/17  . Codeine Nausea And Vomiting  . Morphine Hives  Redness and hives noted post IV admin on 07/05/17    Past Medical History, Surgical history, Social history, and Family History were reviewed and updated.  Review of Systems: Review of Systems  Constitutional: Negative.   HENT: Negative.   Eyes: Negative.   Respiratory: Negative.   Cardiovascular: Negative.   Gastrointestinal: Negative.   Genitourinary: Negative.   Musculoskeletal: Negative.   Skin: Negative.   Neurological: Negative.   Endo/Heme/Allergies: Negative.   Psychiatric/Behavioral: Negative.       Physical Exam:  vitals were not taken for this visit.   Wt Readings from Last 3 Encounters:  12/28/19 192 lb (87.1 kg)  12/14/19 190 lb 12 oz (86.5 kg)  12/07/19 189 lb 8 oz (86 kg)    Physical Exam Vitals reviewed.  HENT:     Head: Normocephalic and atraumatic.  Eyes:     Pupils: Pupils are equal, round, and reactive to light.  Cardiovascular:     Rate and Rhythm: Normal rate and regular rhythm.     Heart sounds: Normal heart sounds.  Pulmonary:     Effort: Pulmonary effort is normal.     Breath sounds: Normal breath sounds.  Abdominal:     General: Bowel sounds are normal.     Palpations: Abdomen is soft.  Musculoskeletal:        General: No tenderness or deformity. Normal range of motion.     Cervical back: Normal range of motion.  Lymphadenopathy:     Cervical: No cervical adenopathy.  Skin:    General: Skin is warm and dry.     Findings: No erythema or rash.  Neurological:     Mental  Status: Tami [name] is alert and oriented to person, place, and time.  Psychiatric:        Behavior: Behavior normal.        Thought Content: Thought content normal.        Judgment: Judgment normal.      Lab Results  Component Value Date   WBC 3.6 (L) 01/04/2020   HGB 12.5 01/04/2020   HCT 38.6 01/04/2020   MCV 89.1 01/04/2020   PLT 158 01/04/2020   Lab Results  Component Value Date   FERRITIN 120 12/07/2019   IRON 82 12/07/2019   TIBC 348 12/07/2019   UIBC 266 12/07/2019   IRONPCTSAT 24 12/07/2019   Lab Results  Component Value Date   RETICCTPCT 0.8 07/16/2016   RBC 4.33 01/04/2020   RETICCTABS 32,640 07/16/2016   Lab Results  Component Value Date   KPAFRELGTCHN 34.5 (H) 12/14/2019   LAMBDASER 3.3 (L) 12/14/2019   KAPLAMBRATIO 10.45 (H) 12/14/2019   Lab Results  Component Value Date   IGGSERUM 2,025 (H) 12/14/2019   IGGSERUM 2,201 (H) 12/14/2019   IGA 101 12/14/2019   IGA 111 12/14/2019   IGMSERUM 71 12/14/2019   IGMSERUM 76 12/14/2019   Lab Results  Component Value Date   TOTALPROTELP 7.4 12/14/2019   ALBUMINELP 3.3 12/14/2019   A1GS 0.2 12/14/2019   A2GS 1.0 12/14/2019   BETS 1.0 12/14/2019   BETA2SER 0.5 07/16/2016   GAMS 1.8 12/14/2019   MSPIKE 1.2 (H) 12/14/2019   SPEI Comment 12/07/2019     Chemistry      Component Value Date/Time   NA 144 12/21/2019 0803   NA 143 11/08/2017 1127   NA 140 12/31/2016 1000   K 3.4 (L) 12/21/2019 0803   K 4.0 11/08/2017 1127   K 3.6 12/31/2016 1000   CL 109 12/21/2019 0803  CL 107 11/08/2017 1127   CO2 27 12/21/2019 0803   CO2 25 11/08/2017 1127   CO2 24 12/31/2016 1000   BUN 21 12/21/2019 0803   BUN 19 11/08/2017 1127   BUN 22.3 12/31/2016 1000   CREATININE 0.70 12/21/2019 0803   CREATININE 0.9 11/08/2017 1127   CREATININE 0.8 12/31/2016 1000      Component Value Date/Time   CALCIUM 9.2 12/21/2019 0803   CALCIUM 9.9 11/08/2017 1127   CALCIUM 9.5 12/31/2016 1000   ALKPHOS 46 12/21/2019 0803   ALKPHOS  44 11/08/2017 1127   ALKPHOS 51 12/31/2016 1000   AST 17 12/21/2019 0803   AST 28 12/31/2016 1000   ALT 12 12/21/2019 0803   ALT 36 11/08/2017 1127   ALT 16 12/31/2016 1000   BILITOT 0.3 12/21/2019 0803   BILITOT 0.46 12/31/2016 1000       Impression and Plan: Tami [name] is a very pleasant 64 yo African American female with a previous IgG kappa MGUS with progression to myeloma.  Since Tami [name] headache is doing so much better, I think we need to permanently discontinue the daratumumab.  This is a unusual side effect from daratumumab.  I do not see any need that we have to embark upon therapy with Tami [name] right now.  We will plan to get Tami [name] back in 1 month.  I know that Tami [name] will enjoy the month off.  I know that Tami [name] will be active.  Tami [name] will be with Tami [name] family.  Tami [name] will just be the woman that Tami [name] wants to be.  [name], MD 2/25/20218:14 AM

## 2020-01-05 LAB — KAPPA/LAMBDA LIGHT CHAINS
Kappa free light chain: 36.7 mg/L — ABNORMAL HIGH (ref 3.3–19.4)
Kappa, lambda light chain ratio: 10.49 — ABNORMAL HIGH (ref 0.26–1.65)
Lambda free light chains: 3.5 mg/L — ABNORMAL LOW (ref 5.7–26.3)

## 2020-01-05 LAB — IGG, IGA, IGM
IgA: 93 mg/dL (ref 87–352)
IgG (Immunoglobin G), Serum: 2114 mg/dL — ABNORMAL HIGH (ref 586–1602)
IgM (Immunoglobulin M), Srm: 72 mg/dL (ref 26–217)

## 2020-01-07 ENCOUNTER — Emergency Department (HOSPITAL_BASED_OUTPATIENT_CLINIC_OR_DEPARTMENT_OTHER)
Admission: EM | Admit: 2020-01-07 | Discharge: 2020-01-07 | Disposition: A | Discharge status: Home / Self Care | Payer: Medicare Other | Attending: Emergency Medicine | Admitting: Emergency Medicine

## 2020-01-07 ENCOUNTER — Encounter (HOSPITAL_BASED_OUTPATIENT_CLINIC_OR_DEPARTMENT_OTHER): Payer: Self-pay

## 2020-01-07 ENCOUNTER — Other Ambulatory Visit: Payer: Self-pay

## 2020-01-07 DIAGNOSIS — H9203 Otalgia, bilateral: Secondary | ICD-10-CM

## 2020-01-07 DIAGNOSIS — Z885 Allergy status to narcotic agent status: Secondary | ICD-10-CM | POA: Insufficient documentation

## 2020-01-07 DIAGNOSIS — Z950 Presence of cardiac pacemaker: Secondary | ICD-10-CM | POA: Diagnosis not present

## 2020-01-07 DIAGNOSIS — Z888 Allergy status to other drugs, medicaments and biological substances status: Secondary | ICD-10-CM | POA: Diagnosis not present

## 2020-01-07 DIAGNOSIS — Z79899 Other long term (current) drug therapy: Secondary | ICD-10-CM | POA: Diagnosis not present

## 2020-01-07 DIAGNOSIS — R0981 Nasal congestion: Secondary | ICD-10-CM

## 2020-01-07 DIAGNOSIS — Z8673 Personal history of transient ischemic attack (TIA), and cerebral infarction without residual deficits: Secondary | ICD-10-CM | POA: Diagnosis not present

## 2020-01-07 DIAGNOSIS — E785 Hyperlipidemia, unspecified: Secondary | ICD-10-CM | POA: Insufficient documentation

## 2020-01-07 DIAGNOSIS — I110 Hypertensive heart disease with heart failure: Secondary | ICD-10-CM | POA: Insufficient documentation

## 2020-01-07 DIAGNOSIS — I509 Heart failure, unspecified: Secondary | ICD-10-CM | POA: Insufficient documentation

## 2020-01-07 MED ORDER — NEOMYCIN-POLYMYXIN-HC 3.5-10000-1 OT SUSP: 4.0000 [drp] | Freq: Four times a day (QID) | OTIC | 0 refills | Status: AC

## 2020-01-07 MED ORDER — FLUTICASONE PROPIONATE 50 MCG/ACT NA SUSP: 2.0000 | Freq: Every day | NASAL | 0 refills | Status: AC

## 2020-01-07 MED ORDER — PREDNISONE 10 MG (21) PO TBPK: ORAL_TABLET | ORAL | 0 refills | Status: DC

## 2020-01-07 MED ORDER — PREDNISONE 50 MG PO TABS
60.0000 mg | ORAL_TABLET | Freq: Once | ORAL | Status: AC
  Administered 2020-01-07: 60 mg via ORAL
  Filled 2020-01-07: qty 1

## 2020-01-07 NOTE — ED Provider Notes (Signed)
[facility] Provider Note   CSN: [id number] Arrival date & time: 01/07/20  2059     History Chief Complaint  Patient presents with  . Otalgia    Tami [name] is a 64 y.o. female.  HPI      Tami [name] is a 64 y.o. female, with a history of eustachian tube dysfunction, CHF, pacemaker, HTN, hyperlipidemia, GERD, multiple myeloma, presenting to the ED with bilateral ear discomfort for the past week.  Pain feels like a soreness, moderate, nonradiating.  She also states her ears feel "stuffy" and she has had nasal congestion.  Denies fever/chills, cough, shortness of breath, chest pain, abdominal pain, N/V/D, headache, syncope, dizziness, or any other complaints.   Past Medical History:  Diagnosis Date  . Abnormal SPEP 07/26/2016  . Arthritis    knees, hands  . Back pain 07/14/2016  . Bowel obstruction ([place]) 11/2014  . C. difficile diarrhea   . CHF (congestive heart failure) ([place])   . Colitis   . Congestive heart failure ([place]) 02/24/2015   S/p pacemaker  . Depression   . Elevated sed rate 08/15/2017  . Epigastric pain 03/22/2017  . GERD (gastroesophageal reflux disease)   . Heart disease   . Hyperlipidemia   . Hypertension   . 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 Low back pain 07/14/2016  . 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 Multiple myeloma ([place]) 07/20/2018  . Multiple myeloma not having achieved remission ([place]) 07/20/2018  . Myalgia 12/31/2016  . Obstructive sleep apnea 03/31/2015  . SOB (shortness of breath) 04/09/2016  . Stroke ([place])    TIAs  . UTI (urinary tract infection)     Patient Active Problem List   Diagnosis Date Noted  . Peripheral neuropathy 11/15/2019  . Chronic migraine without aura, with intractable migraine, so stated, with status migrainosus 10/17/2019  . ETD (Eustachian tube dysfunction), bilateral 09/12/2019  . Urinary frequency 07/17/2019  . Headache 07/17/2019  . Palpitation  05/15/2019  . Urticaria 02/22/2019  . Conjunctivitis 10/17/2018  . Diarrhea 09/04/2018  . Thyroid nodule 08/26/2018  . Drug rash 08/26/2018  . Multiple myeloma ([place]) 07/20/2018  . Multiple myeloma not having achieved remission ([place]) 07/20/2018  . Hematuria 07/11/2018  . Seasonal allergies 06/23/2018  . Right shoulder pain 06/07/2018  . Neck pain 06/07/2018  . Paresthesias 06/07/2018  . 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 Pain in thoracic spine 03/18/2018  . Thoracic back pain 02/15/2018  . 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 Cystitis 10/25/2017  . C. difficile colitis 09/15/2017  . Hypokalemia 08/20/2017  . Anemia 08/20/2017  . Elevated sed rate 08/15/2017  . 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 Epigastric pain 03/22/2017  . Myalgia 12/31/2016  . SOB (shortness of breath) 07/26/2016  . Abnormal SPEP 07/26/2016  . Low back pain 07/14/2016  . Insomnia 12/29/2015  . Tension headache 10/21/2015  . Muscle spasms of neck 10/18/2015  . Dysuria 10/13/2015  . Sinusitis 07/07/2015  . AP (abdominal pain) 07/07/2015  . Cardiomyopathy ([place]) 04/19/2015  . Chest wall pain 04/19/2015  . Obstructive sleep apnea 03/31/2015  . Congestive heart failure ([place]) 02/24/2015  . Arthritis   . Hyperlipidemia   . Hypertension   . Depression   . Stroke ([place])   . GERD (gastroesophageal reflux disease)   . Bowel obstruction ([place]) 11/09/2014    Past Surgical History:  Procedure Laterality Date  . ABDOMINAL HYSTERECTOMY     menorraghia, 2006, total  .  CHOLECYSTECTOMY    . COLONOSCOPY  2018   [facility] per patient  . ESOPHAGOGASTRODUODENOSCOPY  2018   [facility]   . KNEE SURGERY     right, repair torn torn cartialage  . PACEMAKER INSERTION  08/2014  . TONSILLECTOMY    . TUBAL LIGATION       OB History   No obstetric history on file.     Family History  Problem Relation Age of Onset  . Diabetes Mother   . Hypertension  Mother   . Heart disease Mother        s/p 1 stent  . Hyperlipidemia Mother   . Arthritis Mother   . Cancer Father        COLON  . Colon cancer Father 57  . Irritable bowel syndrome Sister   . Hyperlipidemia Daughter   . Hypertension Daughter   . Hypertension Maternal Grandmother   . Arthritis Maternal Grandmother   . Heart disease Maternal Grandfather        MI  . Hypertension Maternal Grandfather   . Arthritis Maternal Grandfather   . Hypertension Son   . Esophageal cancer Neg Hx     Social History   Tobacco Use  . Smoking status: Never Smoker  . Smokeless tobacco: Never Used  Substance Use Topics  . Alcohol use: No  . Drug use: No    Home Medications Prior to Admission medications   Medication Sig Start Date End Date Taking? Authorizing Provider  acetaminophen (TYLENOL) 325 MG tablet Take 650 mg by mouth. 06/07/17   [provider]  ACETAMINOPHEN-BUTALBITAL 50-325 MG TABS Take 1 tablet by mouth 2 (two) times daily as needed. 07/13/19   Mosie Lukes, MD  albuterol (PROVENTIL HFA;VENTOLIN HFA) 108 (90 Base) MCG/ACT inhaler Inhale 2 puffs into the lungs every 6 (six) hours as needed for wheezing or shortness of breath. 08/18/18   Saguier, Percell Miller, PA-C  ALPRAZolam Duanne Moron) 0.25 MG tablet Take 1-2 tabs (0.'25mg'$ -0.'50mg'$ ) 30-60 minutes before procedure. May repeat if needed.Do not drive. 08/30/19   Melvenia Beam, MD  amLODipine (NORVASC) 10 MG tablet Take 1 tablet (10 mg total) by mouth 2 (two) times daily. 11/13/19   Mosie Lukes, MD  aspirin EC 81 MG tablet Take 1 tablet (81 mg total) by mouth daily. 02/15/18   Mosie Lukes, MD  Carboxymethylcellulose Sodium (ARTIFICIAL TEARS OP) Apply to eye.    [provider]  carvedilol (COREG) 25 MG tablet Take 1 tablet (25 mg total) by mouth 2 (two) times daily with a meal. 07/13/19   Mosie Lukes, MD  Cholecalciferol (VITAMIN D-3) 25 MCG (1000 UT) CAPS Take by mouth.    [provider]  conjugated  estrogens (PREMARIN) vaginal cream Place vaginally. 01/31/14   [provider]  cyclobenzaprine (FLEXERIL) 10 MG tablet Take 10 mg by mouth as needed.     [provider]  famciclovir (FAMVIR) 250 MG tablet Take 1 tablet (250 mg total) by mouth daily. 07/20/18   [name], MD  famotidine (PEPCID) 40 MG/5ML suspension Take 5 mLs (40 mg total) by mouth at bedtime. 01/02/20   Mosie Lukes, MD  fluticasone (FLONASE) 50 MCG/ACT nasal spray Place 2 sprays into both nostrils daily. Patient taking differently: Place 2 sprays into both nostrils as needed.  07/13/19   Mosie Lukes, MD  fluticasone (FLONASE) 50 MCG/ACT nasal spray Place 2 sprays into both nostrils daily. 01/07/20   Jaretzy Lhommedieu C, PA-C  furosemide (  LASIX) 20 MG tablet Take 1 tablet (20 mg total) by mouth daily as needed for fluid or edema. 07/13/19   Mosie Lukes, MD  Galcanezumab-gnlm (EMGALITY) 120 MG/ML SOAJ Inject 120 mg into the skin every 30 (thirty) days. 10/17/19   Melvenia Beam, MD  HYDROcodone-acetaminophen (NORCO/VICODIN) 5-325 MG tablet Take 1 tablet by mouth every 4 (four) hours as needed for moderate pain or severe pain. 11/21/18   Mosie Lukes, MD  hyoscyamine (LEVSIN SL) 0.125 MG SL tablet Place 1 tablet (0.125 mg total) under the tongue every 4 (four) hours as needed. 07/25/18   Mosie Lukes, MD  lidocaine (LIDODERM) 5 % Place 1 patch onto the skin daily. Remove & Discard patch within 12 hours or as directed by MD 06/07/18   Jodelle Green, FNP  loratadine (CLARITIN) 10 MG tablet Take 1 tablet (10 mg total) by mouth 2 (two) times daily as needed for allergies. 09/11/19   Mosie Lukes, MD  LORazepam (ATIVAN) 0.5 MG tablet Take 1 tablet (0.5 mg total) by mouth every 8 (eight) hours. 12/05/19   [name], MD  Melatonin 3 MG CAPS Take by mouth.    [provider]  metoCLOPramide (REGLAN) 10 MG tablet Take 1 tablet (10 mg total) by mouth 4 (four) times daily. For migraine or headache or  nausea 08/30/19   Melvenia Beam, MD  montelukast (SINGULAIR) 10 MG tablet Take 1 tablet (10 mg total) by mouth at bedtime. Start taking Singulair on 11/16/2019. 10/31/19   [name], MD  Multiple Vitamins-Minerals (MULTIVITAMIN ADULT PO) Take by mouth.    [provider]  neomycin-polymyxin-hydrocortisone (CORTISPORIN) 3.5-10000-1 OTIC suspension Place 4 drops into both ears 4 (four) times daily for 7 days. 01/07/20 01/14/20  Gaylene Moylan C, PA-C  ondansetron (ZOFRAN) 4 MG tablet Take 1 tablet (4 mg total) by mouth every 8 (eight) hours as needed for nausea or vomiting. 04/14/18   Mosie Lukes, MD  pantoprazole (PROTONIX) 40 MG tablet Take 1 tablet (40 mg total) by mouth 2 (two) times daily. 02/15/18   Mosie Lukes, MD  pomalidomide (POMALYST) 2 MG capsule Take 1 capsule (2 mg total) by mouth daily. Every 21 days. Auth # [id number] 11/16/19   [name], MD  potassium chloride SA (K-DUR) 20 MEQ tablet Take 2 tablets (40 mEq total) by mouth once for 1 dose. 07/13/19 08/30/19  Mosie Lukes, MD  predniSONE (STERAPRED UNI-PAK 21 TAB) 10 MG (21) TBPK tablet Take 6 tabs ('60mg'$ ) day 1, 5 tabs ('50mg'$ ) day 2, 4 tabs ('40mg'$ ) day 3, 3 tabs ('30mg'$ ) day 4, 2 tabs ('20mg'$ ) day 5, and 1 tab ('10mg'$ ) day 6. 01/07/20   Laquentin Loudermilk C, PA-C  prochlorperazine (COMPAZINE) 10 MG tablet Take 1 tablet (10 mg total) by mouth every 6 (six) hours as needed (Nausea or vomiting). 12/05/19   [name], MD  promethazine (PHENERGAN) 25 MG tablet Take 1 tablet (25 mg total) by mouth every 8 (eight) hours as needed for nausea or vomiting. 09/11/19   Mosie Lukes, MD  Rimegepant Sulfate (NURTEC) 75 MG TBDP Take 75 mg by mouth daily as needed. For migraines. Take as close to onset of migraine as possible. One daily maximum. 10/17/19   Melvenia Beam, MD  sucralfate (CARAFATE) 1 g tablet Take 1 tablet (1 g total) by mouth 4 (four) times daily -  with meals and at bedtime. 06/29/18   Saguier, Percell Miller, PA-C  SUMAtriptan  (  IMITREX) 100 MG tablet 1 tab PO at headache onset, may repeat in 2 hrs as needed. No more than 2 tabs per day or 4 tabs per wk. (Can dispense #9 if quality limit) 09/19/19   [provider]  topiramate (TOPAMAX) 50 MG tablet Take 1 tablet (50 mg total) by mouth 2 (two) times daily. 07/13/19   Mosie Lukes, MD  triamterene-hydrochlorothiazide (MAXZIDE-25) 37.5-25 MG tablet Take 1 tablet by mouth daily. 09/19/19   [provider]    Allergies    Benazepril, Ondansetron hcl, Codeine, and Morphine  Review of Systems   Review of Systems  Constitutional: Negative for chills, diaphoresis and fever.  HENT: Positive for congestion and ear pain. Negative for ear discharge, sore throat, trouble swallowing and voice change.   Respiratory: Negative for cough and shortness of breath.   Cardiovascular: Negative for chest pain.  Gastrointestinal: Negative for abdominal pain, diarrhea, nausea and vomiting.  Musculoskeletal: Negative for myalgias.  Neurological: Negative for dizziness and headaches.  All other systems reviewed and are negative.   Physical Exam Updated Vital Signs BP (!) 135/91 (BP Location: Right Arm)   Pulse 79   Temp 97.8 F (36.6 C) (Oral)   Resp 18   Ht '5\' 7"'$  (1.702 m)   Wt 85.7 kg   SpO2 97%   BMI 29.60 kg/m   Physical Exam Vitals and nursing note reviewed.  Constitutional:      General: She is not in acute distress.    Appearance: She is well-developed. She is not diaphoretic.  HENT:     Head: Normocephalic and atraumatic.     Right Ear: Tenderness present. A middle ear effusion is present. No mastoid tenderness. Tympanic membrane is not injected, erythematous or bulging.     Left Ear: Tenderness present. A middle ear effusion is present. No mastoid tenderness. Tympanic membrane is not injected, erythematous or bulging.     Ears:     Comments: Tender tragus bilaterally.  External ear otherwise without abnormality. Erythematous canals bilaterally  without noted debris.    Nose: Mucosal edema and congestion present.     Right Sinus: No maxillary sinus tenderness or frontal sinus tenderness.     Left Sinus: No maxillary sinus tenderness or frontal sinus tenderness.     Mouth/Throat:     Mouth: Mucous membranes are moist.     Pharynx: Oropharynx is clear.  Eyes:     Conjunctiva/sclera: Conjunctivae normal.  Cardiovascular:     Rate and Rhythm: Normal rate and regular rhythm.     Pulses: Normal pulses.          Radial pulses are 2+ on the right side and 2+ on the left side.     Heart sounds: Normal heart sounds.  Pulmonary:     Effort: Pulmonary effort is normal. No respiratory distress.     Breath sounds: Normal breath sounds.  Abdominal:     Palpations: Abdomen is soft.     Tenderness: There is no abdominal tenderness. There is no guarding.  Musculoskeletal:     Cervical back: Neck supple.  Lymphadenopathy:     Cervical: No cervical adenopathy.  Skin:    General: Skin is warm and dry.  Neurological:     Mental Status: She is alert.  Psychiatric:        Mood and Affect: Mood and affect normal.        Speech: Speech normal.        Behavior: Behavior normal.  ED Results / Procedures / Treatments   Labs (all labs ordered are listed, but only abnormal results are displayed) Labs Reviewed - No data to display  EKG None  Radiology No results found.  Procedures Procedures (including critical care time)  Medications Ordered in ED Medications  predniSONE (DELTASONE) tablet 60 mg (60 mg Oral Given 01/07/20 2223)    ED Course  I have reviewed the triage vital signs and the nursing notes.  Pertinent labs & imaging results that were available during my care of the patient were reviewed by me and considered in my medical decision making (see chart for details).    MDM Rules/Calculators/A&P                      Patient presents with bilateral ear pain. Patient is nontoxic appearing, afebrile, not tachycardic, not  tachypneic, not hypotensive, excellent SPO2 on room air, and is in no apparent distress.  Physical exam findings less consistent with otitis media, but may be due to effusion and/or otitis external. We will work on treating both. The patient was given instructions for home care as well as return precautions. Patient voices understanding of these instructions, accepts the plan, and is comfortable with discharge.    Findings and plan of care discussed with Gara Kroner, MD.   Final Clinical Impression(s) / ED Diagnoses Final diagnoses:  Otalgia of both ears  Nasal congestion    Rx / DC Orders ED Discharge Orders         Ordered    predniSONE (STERAPRED UNI-PAK 21 TAB) 10 MG (21) TBPK tablet     01/07/20 2212    neomycin-polymyxin-hydrocortisone (CORTISPORIN) 3.5-10000-1 OTIC suspension  4 times daily     01/07/20 2212    fluticasone (FLONASE) 50 MCG/ACT nasal spray  Daily     01/07/20 2214           Chandra, Asher 01/07/20 2306    Margette Fast, MD 01/08/20 1122

## 2020-01-07 NOTE — ED Triage Notes (Signed)
Pt c/o bilateral ear ache x 1 week. Pt using OTC ear drops without relief.

## 2020-01-07 NOTE — Discharge Instructions (Addendum)
Use 4 drops of the eardrops in each ear 4 times a day for 7 days. Prednisone: Take the prednisone, as prescribed, until finished. If you are a diabetic, please know prednisone can raise your blood sugar temporarily. Follow-up with ear, nose, and throat specialist for persistent symptoms.

## 2020-01-08 DIAGNOSIS — M47812 Spondylosis without myelopathy or radiculopathy, cervical region: Secondary | ICD-10-CM | POA: Diagnosis not present

## 2020-01-08 DIAGNOSIS — M7918 Myalgia, other site: Secondary | ICD-10-CM | POA: Diagnosis not present

## 2020-01-08 DIAGNOSIS — G43809 Other migraine, not intractable, without status migrainosus: Secondary | ICD-10-CM | POA: Diagnosis not present

## 2020-01-08 DIAGNOSIS — M47816 Spondylosis without myelopathy or radiculopathy, lumbar region: Secondary | ICD-10-CM | POA: Diagnosis not present

## 2020-01-09 ENCOUNTER — Other Ambulatory Visit: Payer: Medicare Other

## 2020-01-09 ENCOUNTER — Ambulatory Visit: Payer: Medicare Other

## 2020-01-10 ENCOUNTER — Telehealth: Payer: Self-pay

## 2020-01-10 LAB — PROTEIN ELECTROPHORESIS, SERUM, WITH REFLEX
A/G Ratio: 0.9 (ref 0.7–1.7)
Albumin ELP: 3.5 g/dL (ref 2.9–4.4)
Alpha-1-Globulin: 0.2 g/dL (ref 0.0–0.4)
Alpha-2-Globulin: 1 g/dL (ref 0.4–1.0)
Beta Globulin: 1.1 g/dL (ref 0.7–1.3)
Gamma Globulin: 1.7 g/dL (ref 0.4–1.8)
Globulin, Total: 3.9 g/dL (ref 2.2–3.9)
M-Spike, %: 1 g/dL — ABNORMAL HIGH
SPEP Interpretation: 0
Total Protein ELP: 7.4 g/dL (ref 6.0–8.5)

## 2020-01-10 LAB — IMMUNOFIXATION REFLEX, SERUM
IgA: 101 mg/dL (ref 87–352)
IgG (Immunoglobin G), Serum: 2161 mg/dL — ABNORMAL HIGH (ref 586–1602)
IgM (Immunoglobulin M), Srm: 68 mg/dL (ref 26–217)

## 2020-01-10 NOTE — Telephone Encounter (Addendum)
Attached message left on pt's VM with instructions to contact the office with questions/concerns. dph  ----- Message from [name], MD sent at 01/10/2020 12:34 PM EST ----- Call - the myeloma is still improving!! The M-spike is down to 1!!!  Tami [name]

## 2020-01-11 ENCOUNTER — Other Ambulatory Visit: Payer: Medicare Other

## 2020-01-11 ENCOUNTER — Ambulatory Visit: Payer: Medicare Other

## 2020-01-15 ENCOUNTER — Other Ambulatory Visit: Payer: Self-pay

## 2020-01-16 ENCOUNTER — Ambulatory Visit: Payer: Medicare Other

## 2020-01-16 ENCOUNTER — Other Ambulatory Visit: Payer: Self-pay

## 2020-01-16 ENCOUNTER — Other Ambulatory Visit: Payer: Medicare Other

## 2020-01-16 ENCOUNTER — Ambulatory Visit (INDEPENDENT_AMBULATORY_CARE_PROVIDER_SITE_OTHER): Payer: Medicare Other | Admitting: Family Medicine

## 2020-01-16 VITALS — BP 106/56 | HR 86 | Temp 96.3°F | Resp 12 | Ht 66.0 in | Wt 195.4 lb

## 2020-01-16 DIAGNOSIS — R8290 Unspecified abnormal findings in urine: Secondary | ICD-10-CM

## 2020-01-16 DIAGNOSIS — E785 Hyperlipidemia, unspecified: Secondary | ICD-10-CM

## 2020-01-16 DIAGNOSIS — R3989 Other symptoms and signs involving the genitourinary system: Secondary | ICD-10-CM

## 2020-01-16 DIAGNOSIS — R739 Hyperglycemia, unspecified: Secondary | ICD-10-CM

## 2020-01-16 DIAGNOSIS — I10 Essential (primary) hypertension: Secondary | ICD-10-CM | POA: Diagnosis not present

## 2020-01-16 DIAGNOSIS — D649 Anemia, unspecified: Secondary | ICD-10-CM | POA: Diagnosis not present

## 2020-01-16 DIAGNOSIS — H6983 Other specified disorders of Eustachian tube, bilateral: Secondary | ICD-10-CM

## 2020-01-16 LAB — COMPREHENSIVE METABOLIC PANEL
ALT: 11 U/L (ref 0–35)
AST: 14 U/L (ref 0–37)
Albumin: 3.4 g/dL — ABNORMAL LOW (ref 3.5–5.2)
Alkaline Phosphatase: 47 U/L (ref 39–117)
BUN: 18 mg/dL (ref 6–23)
CO2: 29 mEq/L (ref 19–32)
Calcium: 8.8 mg/dL (ref 8.4–10.5)
Chloride: 107 mEq/L (ref 96–112)
Creatinine, Ser: 0.72 mg/dL (ref 0.40–1.20)
GFR: 98.17 mL/min (ref >60.00)
Glucose, Bld: 99 mg/dL (ref 70–99)
Potassium: 3.4 mEq/L — ABNORMAL LOW (ref 3.5–5.1)
Sodium: 142 mEq/L (ref 135–145)
Total Bilirubin: 0.5 mg/dL (ref 0.2–1.2)
Total Protein: 7 g/dL (ref 6.0–8.3)

## 2020-01-16 LAB — POC URINALSYSI DIPSTICK (AUTOMATED)
Bilirubin, UA: NEGATIVE
Blood, UA: NEGATIVE
Glucose, UA: NEGATIVE
Ketones, UA: NEGATIVE
Nitrite, UA: POSITIVE
Protein, UA: POSITIVE — AB
Spec Grav, UA: 1.015 (ref 1.010–1.025)
Urobilinogen, UA: 0.2 E.U./dL
pH, UA: 6 (ref 5.0–8.0)

## 2020-01-16 LAB — TSH: TSH: 1.29 u[IU]/mL (ref 0.35–4.50)

## 2020-01-16 LAB — LIPID PANEL
Cholesterol: 193 mg/dL (ref 0–200)
HDL: 52.9 mg/dL (ref >39.00)
LDL Cholesterol: 116 mg/dL — ABNORMAL HIGH (ref 0–99)
NonHDL: 140.31
Total CHOL/HDL Ratio: 4
Triglycerides: 120 mg/dL (ref 0.0–149.0)
VLDL: 24 mg/dL (ref 0.0–40.0)

## 2020-01-16 LAB — CBC
HCT: 37.4 % (ref 36.0–46.0)
Hemoglobin: 12.4 g/dL (ref 12.0–15.0)
MCHC: 33 g/dL (ref 30.0–36.0)
MCV: 90.1 fl (ref 78.0–100.0)
Platelets: 160 10*3/uL (ref 150.0–400.0)
RBC: 4.16 Mil/uL (ref 3.87–5.11)
RDW: 15.3 % (ref 11.5–15.5)
WBC: 4.6 10*3/uL (ref 4.0–10.5)

## 2020-01-16 LAB — HEMOGLOBIN A1C: Hgb A1c MFr Bld: 5.9 % (ref 4.6–6.5)

## 2020-01-16 MED ORDER — AMLODIPINE BESYLATE 10 MG PO TABS: 10.0000 mg | ORAL_TABLET | Freq: Two times a day (BID) | ORAL | 1 refills | Status: DC

## 2020-01-16 MED ORDER — CEFDINIR 300 MG PO CAPS: 300.0000 mg | ORAL_CAPSULE | Freq: Two times a day (BID) | ORAL | 0 refills | Status: AC

## 2020-01-16 NOTE — Patient Instructions (Signed)
AZO cranberry tabs as directed Probiotic for the month  Urinary Tract Infection, Adult  A urinary tract infection (UTI) is an infection of any part of the urinary tract. The urinary tract includes the kidneys, ureters, bladder, and urethra. These organs make, store, and get rid of urine in the body. Your health care provider may use other names to describe the infection. An upper UTI affects the ureters and kidneys (pyelonephritis). A lower UTI affects the bladder (cystitis) and urethra (urethritis). What are the causes? Most urinary tract infections are caused by bacteria in your genital area, around the entrance to your urinary tract (urethra). These bacteria grow and cause inflammation of your urinary tract. What increases the risk? You are more likely to develop this condition if:  You have a urinary catheter that stays in place (indwelling).  You are not able to control when you urinate or have a bowel movement (you have incontinence).  You are female and you: ? Use a spermicide or diaphragm for birth control. ? Have low estrogen levels. ? Are pregnant.  You have certain genes that increase your risk (genetics).  You are sexually active.  You take antibiotic medicines.  You have a condition that causes your flow of urine to slow down, such as: ? An enlarged prostate, if you are female. ? Blockage in your urethra (stricture). ? A kidney stone. ? A nerve condition that affects your bladder control (neurogenic bladder). ? Not getting enough to drink, or not urinating often.  You have certain medical conditions, such as: ? Diabetes. ? A weak disease-fighting system (immunesystem). ? Sickle cell disease. ? Gout. ? Spinal cord injury. What are the signs or symptoms? Symptoms of this condition include:  Needing to urinate right away (urgently).  Frequent urination or passing small amounts of urine frequently.  Pain or burning with urination.  Blood in the urine.  Urine  that smells bad or unusual.  Trouble urinating.  Cloudy urine.  Vaginal discharge, if you are female.  Pain in the abdomen or the lower back. You may also have:  Vomiting or a decreased appetite.  Confusion.  Irritability or tiredness.  A fever.  Diarrhea. The first symptom in older adults may be confusion. In some cases, they may not have any symptoms until the infection has worsened. How is this diagnosed? This condition is diagnosed based on your medical history and a physical exam. You may also have other tests, including:  Urine tests.  Blood tests.  Tests for sexually transmitted infections (STIs). If you have had more than one UTI, a cystoscopy or imaging studies may be done to determine the cause of the infections. How is this treated? Treatment for this condition includes:  Antibiotic medicine.  Over-the-counter medicines to treat discomfort.  Drinking enough water to stay hydrated. If you have frequent infections or have other conditions such as a kidney stone, you may need to see a health care provider who specializes in the urinary tract (urologist). In rare cases, urinary tract infections can cause sepsis. Sepsis is a life-threatening condition that occurs when the body responds to an infection. Sepsis is treated in the hospital with IV antibiotics, fluids, and other medicines. Follow these instructions at home:  Medicines  Take over-the-counter and prescription medicines only as told by your health care provider.  If you were prescribed an antibiotic medicine, take it as told by your health care provider. Do not stop using the antibiotic even if you start to feel better. General instructions  Make sure you: ? Empty your bladder often and completely. Do not hold urine for long periods of time. ? Empty your bladder after sex. ? Wipe from front to back after a bowel movement if you are female. Use each tissue one time when you wipe.  Drink enough fluid  to keep your urine pale yellow.  Keep all follow-up visits as told by your health care provider. This is important. Contact a health care provider if:  Your symptoms do not get better after 1-2 days.  Your symptoms go away and then return. Get help right away if you have:  Severe pain in your back or your lower abdomen.  A fever.  Nausea or vomiting. Summary  A urinary tract infection (UTI) is an infection of any part of the urinary tract, which includes the kidneys, ureters, bladder, and urethra.  Most urinary tract infections are caused by bacteria in your genital area, around the entrance to your urinary tract (urethra).  Treatment for this condition often includes antibiotic medicines.  If you were prescribed an antibiotic medicine, take it as told by your health care provider. Do not stop using the antibiotic even if you start to feel better.  Keep all follow-up visits as told by your health care provider. This is important. This information is not intended to replace advice given to you by your health care provider. Make sure you discuss any questions you have with your health care provider. Document Revised: 10/13/2018 Document Reviewed: 05/05/2018 Elsevier Patient Education  2020 [employer].

## 2020-01-17 ENCOUNTER — Other Ambulatory Visit: Payer: Self-pay | Admitting: *Deleted

## 2020-01-17 DIAGNOSIS — R739 Hyperglycemia, unspecified: Secondary | ICD-10-CM | POA: Insufficient documentation

## 2020-01-17 DIAGNOSIS — R3989 Other symptoms and signs involving the genitourinary system: Secondary | ICD-10-CM | POA: Insufficient documentation

## 2020-01-17 DIAGNOSIS — E876 Hypokalemia: Secondary | ICD-10-CM

## 2020-01-17 LAB — URINE CULTURE
MICRO NUMBER:: 10230716
SPECIMEN QUALITY:: ADEQUATE

## 2020-01-17 NOTE — Assessment & Plan Note (Signed)
hgba1c acceptable, minimize simple carbs. Increase exercise as tolerated. Continue current meds 

## 2020-01-17 NOTE — Progress Notes (Signed)
Subjective:    [name], female    DOB: 06-09-1956, 64 y.o.   MRN: [id number]  Chief Complaint  Patient presents with  . bladder pressure  . smell to urine    HPI Patient is in today for follow up on chronic medical concerns. She had presented to ER with ear pain. She was treated with steroids and antibiotic drops and she feels much better. No fevers or chills. No headaches. Denies CP/palp/SOB/HA/fevers/GI or GU c/o. Taking meds as prescribed  Past Medical History:  Diagnosis Date  . Abnormal SPEP 07/26/2016  . Arthritis    knees, hands  . Back pain 07/14/2016  . Bowel obstruction ([place]) 11/2014  . C. difficile diarrhea   . CHF (congestive heart failure) ([place])   . Colitis   . Congestive heart failure ([place]) 02/24/2015   S/p pacemaker  . Depression   . Elevated sed rate 08/15/2017  . Epigastric pain 03/22/2017  . GERD (gastroesophageal reflux disease)   . Heart disease   . Hyperlipidemia   . Hypertension   . 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 Low back pain 07/14/2016  . 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 Multiple myeloma ([place]) 07/20/2018  . Multiple myeloma not having achieved remission ([place]) 07/20/2018  . Myalgia 12/31/2016  . Obstructive sleep apnea 03/31/2015  . SOB (shortness of breath) 04/09/2016  . Stroke ([place])    TIAs  . UTI (urinary tract infection)     Past Surgical History:  Procedure Laterality Date  . ABDOMINAL HYSTERECTOMY     menorraghia, 2006, total  . CHOLECYSTECTOMY    . COLONOSCOPY  2018   [facility] per patient  . ESOPHAGOGASTRODUODENOSCOPY  2018   [facility]   . KNEE SURGERY     right, repair torn torn cartialage  . PACEMAKER INSERTION  08/2014  . TONSILLECTOMY    . TUBAL LIGATION      Family History  Problem Relation Age of Onset  . Diabetes Mother   . Hypertension Mother   . Heart disease Mother        s/p 1 stent  . Hyperlipidemia Mother   . Arthritis Mother   .  Cancer Father        COLON  . Colon cancer Father 31  . Irritable bowel syndrome Sister   . Hyperlipidemia Daughter   . Hypertension Daughter   . Hypertension Maternal Grandmother   . Arthritis Maternal Grandmother   . Heart disease Maternal Grandfather        MI  . Hypertension Maternal Grandfather   . Arthritis Maternal Grandfather   . Hypertension Son   . Esophageal cancer Neg Hx     Social History   Socioeconomic History  . Marital status: Divorced    Spouse name: [name]  . Number of children: 3  . Years of education: [name]  . Highest education level: High school graduate  Occupational History  . [name]  Tobacco Use  . Smoking status: Never Smoker  . Smokeless tobacco: Never Used  Substance and Sexual Activity  . Alcohol use: No  . Drug use: No  . Sexual activity: [name]  Other Topics Concern  . [name]  Social History Narrative   Lives at home alone   Retired   Caffeine: coffee   Social Determinants of Health   Financial Resource Strain:   . Difficulty of Paying Living Expenses: [name]  Food Insecurity:   .  Worried About Charity fundraiser in the Last Year: [name]  . Ran Out of Food in the Last Year: [name]  Transportation Needs:   . Lack of Transportation (Medical): [name]  . Lack of Transportation (Non-Medical): [name]  Physical Activity:   . Days of Exercise per Week: [name]  . Minutes of Exercise per Session: [name]  Stress:   . Feeling of Stress : [name]  Social Connections:   . Frequency of Communication with Friends and Family: [name]  . Frequency of Social Gatherings with Friends and Family: [name]  . Attends Religious Services: [name]  . Active Member of Clubs or Organizations: [name]  . Attends Archivist Meetings: [name]  . Marital Status: [name]  Intimate Partner Violence:   . Fear of Current or Ex-Partner: [name]  .  Emotionally Abused: [name]  . Physically Abused: [name]  . Sexually Abused: [name]    Outpatient Medications Prior to Visit  Medication Sig Dispense Refill  . acetaminophen (TYLENOL) 325 MG tablet Take 650 mg by mouth.    . ACETAMINOPHEN-BUTALBITAL 50-325 MG TABS Take 1 tablet by mouth 2 (two) times daily as needed. 120 tablet 1  . albuterol (PROVENTIL HFA;VENTOLIN HFA) 108 (90 Base) MCG/ACT inhaler Inhale 2 puffs into the lungs every 6 (six) hours as needed for wheezing or shortness of breath. 1 Inhaler 2  . ALPRAZolam (XANAX) 0.25 MG tablet Take 1-2 tabs (0.'25mg'$ -0.'50mg'$ ) 30-60 minutes before procedure. May repeat if needed.Do not drive. 4 tablet 0  . aspirin EC 81 MG tablet Take 1 tablet (81 mg total) by mouth daily. 90 tablet 1  . Carboxymethylcellulose Sodium (ARTIFICIAL TEARS OP) Apply to eye.    . carvedilol (COREG) 25 MG tablet Take 1 tablet (25 mg total) by mouth 2 (two) times daily with a meal. 180 tablet 1  . Cholecalciferol (VITAMIN D-3) 25 MCG (1000 UT) CAPS Take by mouth.    . conjugated estrogens (PREMARIN) vaginal cream Place vaginally.    . cyclobenzaprine (FLEXERIL) 10 MG tablet Take 10 mg by mouth as needed.     . famciclovir (FAMVIR) 250 MG tablet Take 1 tablet (250 mg total) by mouth daily. 30 tablet 12  . famotidine (PEPCID) 40 MG/5ML suspension Take 5 mLs (40 mg total) by mouth at bedtime. 150 mL 5  . fluticasone (FLONASE) 50 MCG/ACT nasal spray Place 2 sprays into both nostrils daily. (Patient taking differently: Place 2 sprays into both nostrils as needed. ) 16 g 1  . fluticasone (FLONASE) 50 MCG/ACT nasal spray Place 2 sprays into both nostrils daily. 16 g 0  . furosemide (LASIX) 20 MG tablet Take 1 tablet (20 mg total) by mouth daily as needed for fluid or edema. 30 tablet 2  . Galcanezumab-gnlm (EMGALITY) 120 MG/ML SOAJ Inject 120 mg into the skin every 30 (thirty) days. 1 pen 11  . HYDROcodone-acetaminophen (NORCO/VICODIN) 5-325 MG tablet Take 1 tablet  by mouth every 4 (four) hours as needed for moderate pain or severe pain. 20 tablet 0  . hyoscyamine (LEVSIN SL) 0.125 MG SL tablet Place 1 tablet (0.125 mg total) under the tongue every 4 (four) hours as needed. 30 tablet 2  . lidocaine (LIDODERM) 5 % Place 1 patch onto the skin daily. Remove & Discard patch within 12 hours or as directed by MD  30 patch 2  . loratadine (CLARITIN) 10 MG tablet Take 1 tablet (10 mg total) by mouth 2 (two) times daily as needed for allergies. 30 tablet 11  . LORazepam (ATIVAN) 0.5 MG tablet Take 1 tablet (0.5 mg total) by mouth every 8 (eight) hours. 30 tablet 0  . Melatonin 3 MG CAPS Take by mouth.    . metoCLOPramide (REGLAN) 10 MG tablet Take 1 tablet (10 mg total) by mouth 4 (four) times daily. For migraine or headache or nausea 30 tablet 6  . montelukast (SINGULAIR) 10 MG tablet Take 1 tablet (10 mg total) by mouth at bedtime. Start taking Singulair on 11/16/2019. 20 tablet 0  . Multiple Vitamins-Minerals (MULTIVITAMIN ADULT PO) Take by mouth.    . ondansetron (ZOFRAN) 4 MG tablet Take 1 tablet (4 mg total) by mouth every 8 (eight) hours as needed for nausea or vomiting. 30 tablet 1  . pantoprazole (PROTONIX) 40 MG tablet Take 1 tablet (40 mg total) by mouth 2 (two) times daily. 180 tablet 1  . pomalidomide (POMALYST) 2 MG capsule Take 1 capsule (2 mg total) by mouth daily. Every 21 days. Auth # [id number] 21 capsule 0  . prochlorperazine (COMPAZINE) 10 MG tablet Take 1 tablet (10 mg total) by mouth every 6 (six) hours as needed (Nausea or vomiting). 30 tablet 1  . promethazine (PHENERGAN) 25 MG tablet Take 1 tablet (25 mg total) by mouth every 8 (eight) hours as needed for nausea or vomiting. 30 tablet 2  . Rimegepant Sulfate (NURTEC) 75 MG TBDP Take 75 mg by mouth daily as needed. For migraines. Take as close to onset of migraine as possible. One daily maximum. 10 tablet 6  . sucralfate (CARAFATE) 1 g tablet Take 1 tablet (1 g total) by mouth 4 (four) times daily -   with meals and at bedtime. 120 tablet 0  . SUMAtriptan (IMITREX) 100 MG tablet 1 tab PO at headache onset, may repeat in 2 hrs as needed. No more than 2 tabs per day or 4 tabs per wk. (Can dispense #9 if quality limit)    . topiramate (TOPAMAX) 50 MG tablet Take 1 tablet (50 mg total) by mouth 2 (two) times daily. 60 tablet 3  . triamterene-hydrochlorothiazide (MAXZIDE-25) 37.5-25 MG tablet Take 1 tablet by mouth daily.    Marland Kitchen amLODipine (NORVASC) 10 MG tablet Take 1 tablet (10 mg total) by mouth 2 (two) times daily. 60 tablet 3  . potassium chloride SA (K-DUR) 20 MEQ tablet Take 2 tablets (40 mEq total) by mouth once for 1 dose. 60 tablet 3  . predniSONE (STERAPRED UNI-PAK 21 TAB) 10 MG (21) TBPK tablet Take 6 tabs ('60mg'$ ) day 1, 5 tabs ('50mg'$ ) day 2, 4 tabs ('40mg'$ ) day 3, 3 tabs ('30mg'$ ) day 4, 2 tabs ('20mg'$ ) day 5, and 1 tab ('10mg'$ ) day 6. 21 tablet 0   No facility-administered medications prior to visit.    Allergies  Allergen Reactions  . Benazepril Anaphylaxis and Swelling    angioedema Throat and lip swelling  . Ondansetron Hcl Hives    Redness and hives post IV admin on 07/05/17  . Codeine Nausea And Vomiting  . Morphine Hives    Redness and hives noted post IV admin on 07/05/17    Review of Systems  Constitutional: Negative for chills, fever and malaise/fatigue.  HENT: Positive for congestion. Negative for hearing loss.   Eyes: Negative for discharge.  Respiratory: Negative for cough, sputum production and shortness of breath.   Cardiovascular:  Negative for chest pain, palpitations and leg swelling.  Gastrointestinal: Negative for abdominal pain, blood in stool, constipation, diarrhea, heartburn, nausea and vomiting.  Genitourinary: Negative for dysuria, frequency, hematuria and urgency.  Musculoskeletal: Negative for back pain, falls and myalgias.  Skin: Negative for rash.  Neurological: Negative for dizziness, sensory change, loss of consciousness, weakness and headaches.    Endo/Heme/Allergies: Negative for environmental allergies. Does not bruise/bleed easily.  Psychiatric/Behavioral: Negative for depression and suicidal ideas. The patient is not nervous/anxious and does not have insomnia.        Objective:    Physical Exam Constitutional:      General: She is not in acute distress.    Appearance: She is not diaphoretic.  HENT:     Head: Normocephalic and atraumatic.     Right Ear: External ear normal.     Left Ear: External ear normal.     Nose: Nose normal.     Mouth/Throat:     Pharynx: No oropharyngeal exudate.  Eyes:     General: No scleral icterus.       Right eye: No discharge.        Left eye: No discharge.     Conjunctiva/sclera: Conjunctivae normal.     Pupils: Pupils are equal, round, and reactive to light.  Neck:     Thyroid: No thyromegaly.  Cardiovascular:     Rate and Rhythm: Normal rate and regular rhythm.     Heart sounds: Normal heart sounds. No murmur.  Pulmonary:     Effort: Pulmonary effort is normal. No respiratory distress.     Breath sounds: Normal breath sounds. No wheezing or rales.  Abdominal:     General: Bowel sounds are normal. There is no distension.     Palpations: Abdomen is soft. There is no mass.     Tenderness: There is no abdominal tenderness.  Musculoskeletal:        General: No tenderness. Normal range of motion.     Cervical back: Normal range of motion and neck supple.  Lymphadenopathy:     Cervical: No cervical adenopathy.  Skin:    General: Skin is warm and dry.     Findings: No rash.  Neurological:     Mental Status: She is alert and oriented to person, place, and time.     Cranial Nerves: No cranial nerve deficit.     Coordination: Coordination normal.     Deep Tendon Reflexes: Reflexes are normal and symmetric. Reflexes normal.     BP (!) 106/56 (BP Location: Left Arm, Cuff Size: Large)   Pulse 86   Temp (!) 96.3 F (35.7 C) (Temporal)   Resp 12   Ht '5\' 6"'$  (1.676 m)   Wt 195 lb  6.4 oz (88.6 kg)   SpO2 100%   BMI 31.54 kg/m  Wt Readings from Last 3 Encounters:  01/16/20 195 lb 6.4 oz (88.6 kg)  01/07/20 189 lb (85.7 kg)  01/04/20 190 lb (86.2 kg)    Diabetic Foot Exam - Simple   No data filed     Lab Results  Component Value Date   WBC 4.6 01/16/2020   HGB 12.4 01/16/2020   HCT 37.4 01/16/2020   PLT 160.0 01/16/2020   GLUCOSE 99 01/16/2020   CHOL 193 01/16/2020   TRIG 120.0 01/16/2020   HDL 52.90 01/16/2020   LDLCALC 116 (H) 01/16/2020   ALT 11 01/16/2020   AST 14 01/16/2020   NA 142 01/16/2020   K 3.4 (L) 01/16/2020  CL 107 01/16/2020   CREATININE 0.72 01/16/2020   BUN 18 01/16/2020   CO2 29 01/16/2020   TSH 1.29 01/16/2020   INR 0.90 07/28/2018   HGBA1C 5.9 01/16/2020    Lab Results  Component Value Date   TSH 1.29 01/16/2020   Lab Results  Component Value Date   WBC 4.6 01/16/2020   HGB 12.4 01/16/2020   HCT 37.4 01/16/2020   MCV 90.1 01/16/2020   PLT 160.0 01/16/2020   Lab Results  Component Value Date   NA 142 01/16/2020   K 3.4 (L) 01/16/2020   CHLORIDE 108 12/31/2016   CO2 29 01/16/2020   GLUCOSE 99 01/16/2020   BUN 18 01/16/2020   CREATININE 0.72 01/16/2020   BILITOT 0.5 01/16/2020   ALKPHOS 47 01/16/2020   AST 14 01/16/2020   ALT 11 01/16/2020   PROT 7.0 01/16/2020   ALBUMIN 3.4 (L) 01/16/2020   CALCIUM 8.8 01/16/2020   ANIONGAP 8 01/04/2020   EGFR >90 12/31/2016   GFR 98.17 01/16/2020   Lab Results  Component Value Date   CHOL 193 01/16/2020   Lab Results  Component Value Date   HDL 52.90 01/16/2020   Lab Results  Component Value Date   LDLCALC 116 (H) 01/16/2020   Lab Results  Component Value Date   TRIG 120.0 01/16/2020   Lab Results  Component Value Date   CHOLHDL 4 01/16/2020   Lab Results  Component Value Date   HGBA1C 5.9 01/16/2020       Assessment & Plan:   Problem List Items Addressed This Visit    Hyperlipidemia    Encouraged heart healthy diet, increase exercise, avoid  trans fats, consider a krill oil cap daily      Relevant Medications   amLODipine (NORVASC) 10 MG tablet   Hypertension    Well controlled, no changes to meds. Encouraged heart healthy diet such as the DASH diet and exercise as tolerated.       Relevant Medications   amLODipine (NORVASC) 10 MG tablet   Anemia   ETD (Eustachian tube dysfunction), bilateral    Was treated for otitis externa by ENT and she notes some improvement.       Sensation of pressure in bladder area - Primary    Check UA and culture      Relevant Orders   Urine Culture   POCT Urinalysis Dipstick (Automated) (Completed)   Hyperglycemia    hgba1c acceptable, minimize simple carbs. Increase exercise as tolerated. Continue current meds       Other Visit Diagnoses    Foul smelling urine       Relevant Orders   Urine Culture   POCT Urinalysis Dipstick (Automated) (Completed)      I have discontinued Lannette Donath predniSONE. I am also having her start on cefdinir. Additionally, I am having her maintain her conjugated estrogens, acetaminophen, pantoprazole, aspirin EC, ondansetron, lidocaine, sucralfate, famciclovir, hyoscyamine, albuterol, cyclobenzaprine, Multiple Vitamins-Minerals (MULTIVITAMIN ADULT PO), HYDROcodone-acetaminophen, carvedilol, topiramate, fluticasone, potassium chloride SA, ACETAMINOPHEN-BUTALBITAL, furosemide, Carboxymethylcellulose Sodium (ARTIFICIAL TEARS OP), metoCLOPramide, ALPRAZolam, loratadine, promethazine, triamterene-hydrochlorothiazide, SUMAtriptan, Emgality, Nurtec, montelukast, pomalidomide, Vitamin D-3, Melatonin, prochlorperazine, LORazepam, famotidine, fluticasone, and amLODipine.  Meds ordered this encounter  Medications  . cefdinir (OMNICEF) 300 MG capsule    Sig: Take 1 capsule (300 mg total) by mouth 2 (two) times daily for 7 days.    Dispense:  14 capsule    Refill:  0  . amLODipine (NORVASC) 10 MG tablet    Sig: Take 1  tablet (10 mg total) by mouth 2 (two) times  daily.    Dispense:  180 tablet    Refill:  1     Penni Homans, MD

## 2020-01-17 NOTE — Assessment & Plan Note (Signed)
Well controlled, no changes to meds. Encouraged heart healthy diet such as the DASH diet and exercise as tolerated.  °

## 2020-01-17 NOTE — Assessment & Plan Note (Signed)
Was treated for otitis externa by ENT and she notes some improvement.

## 2020-01-17 NOTE — Assessment & Plan Note (Signed)
Encouraged heart healthy diet, increase exercise, avoid trans fats, consider a krill oil cap daily 

## 2020-01-17 NOTE — Assessment & Plan Note (Signed)
Check UA and culture 

## 2020-01-18 ENCOUNTER — Emergency Department (HOSPITAL_BASED_OUTPATIENT_CLINIC_OR_DEPARTMENT_OTHER)
Admission: EM | Admit: 2020-01-18 | Discharge: 2020-01-18 | Disposition: A | Discharge status: Home / Self Care | Payer: Medicare Other | Attending: Emergency Medicine | Admitting: Emergency Medicine

## 2020-01-18 ENCOUNTER — Other Ambulatory Visit: Payer: Self-pay

## 2020-01-18 ENCOUNTER — Other Ambulatory Visit: Payer: Medicare Other

## 2020-01-18 ENCOUNTER — Ambulatory Visit: Payer: Medicare Other

## 2020-01-18 ENCOUNTER — Encounter (HOSPITAL_BASED_OUTPATIENT_CLINIC_OR_DEPARTMENT_OTHER): Payer: Self-pay

## 2020-01-18 DIAGNOSIS — N390 Urinary tract infection, site not specified: Secondary | ICD-10-CM | POA: Insufficient documentation

## 2020-01-18 DIAGNOSIS — Z950 Presence of cardiac pacemaker: Secondary | ICD-10-CM | POA: Insufficient documentation

## 2020-01-18 DIAGNOSIS — Z79899 Other long term (current) drug therapy: Secondary | ICD-10-CM | POA: Diagnosis not present

## 2020-01-18 DIAGNOSIS — Z7982 Long term (current) use of aspirin: Secondary | ICD-10-CM | POA: Insufficient documentation

## 2020-01-18 DIAGNOSIS — Z885 Allergy status to narcotic agent status: Secondary | ICD-10-CM | POA: Insufficient documentation

## 2020-01-18 DIAGNOSIS — I509 Heart failure, unspecified: Secondary | ICD-10-CM | POA: Insufficient documentation

## 2020-01-18 DIAGNOSIS — I110 Hypertensive heart disease with heart failure: Secondary | ICD-10-CM | POA: Insufficient documentation

## 2020-01-18 DIAGNOSIS — Z888 Allergy status to other drugs, medicaments and biological substances status: Secondary | ICD-10-CM | POA: Diagnosis not present

## 2020-01-18 DIAGNOSIS — Z8673 Personal history of transient ischemic attack (TIA), and cerebral infarction without residual deficits: Secondary | ICD-10-CM | POA: Diagnosis not present

## 2020-01-18 DIAGNOSIS — E785 Hyperlipidemia, unspecified: Secondary | ICD-10-CM | POA: Insufficient documentation

## 2020-01-18 DIAGNOSIS — R300 Dysuria: Secondary | ICD-10-CM | POA: Diagnosis present

## 2020-01-18 LAB — URINALYSIS, ROUTINE W REFLEX MICROSCOPIC
Bilirubin Urine: NEGATIVE
Glucose, UA: NEGATIVE mg/dL
Hgb urine dipstick: NEGATIVE
Ketones, ur: NEGATIVE mg/dL
Nitrite: NEGATIVE
Protein, ur: NEGATIVE mg/dL
Specific Gravity, Urine: >1.03 — ABNORMAL HIGH (ref 1.005–1.030)
pH: 6 (ref 5.0–8.0)

## 2020-01-18 LAB — URINALYSIS, MICROSCOPIC (REFLEX): RBC / HPF: NONE SEEN RBC/hpf (ref 0–5)

## 2020-01-18 MED ORDER — NITROFURANTOIN MONOHYD MACRO 100 MG PO CAPS
100.0000 mg | ORAL_CAPSULE | Freq: Once | ORAL | Status: AC
  Administered 2020-01-18: 100 mg via ORAL
  Filled 2020-01-18: qty 1

## 2020-01-18 MED ORDER — NITROFURANTOIN MONOHYD MACRO 100 MG PO CAPS: 100.0000 mg | ORAL_CAPSULE | Freq: Two times a day (BID) | ORAL | 0 refills | Status: DC

## 2020-01-18 NOTE — ED Provider Notes (Signed)
[facility] EMERGENCY DEPARTMENT Provider Note   CSN: [id number] Arrival date & time: 01/18/20  1930     History Chief Complaint  Patient presents with  . Dysuria    Tami [name] is a 64 y.o. female.  HPI 64 year old female presents with dysuria.  Feels like she has a recurrent UTI.  Has been ongoing for about 3 days.  Saw her doctor and urine was sent for culture.  She denies fever, back or flank pain.  Has been having urinary frequency but less urine output.  No abdominal pain, vaginal pain, vaginal bleeding or discharge.  Also has low back pain.  All of these are reminiscent of prior UTIs.   Past Medical History:  Diagnosis Date  . Abnormal SPEP 07/26/2016  . Arthritis    knees, hands  . Back pain 07/14/2016  . Bowel obstruction ([place]) 11/2014  . C. difficile diarrhea   . CHF (congestive heart failure) ([place])   . Colitis   . Congestive heart failure ([place]) 02/24/2015   S/p pacemaker  . Depression   . Elevated sed rate 08/15/2017  . Epigastric pain 03/22/2017  . GERD (gastroesophageal reflux disease)   . Heart disease   . Hyperlipidemia   . Hypertension   . 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 Low back pain 07/14/2016  . 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 Multiple myeloma ([place]) 07/20/2018  . Multiple myeloma not having achieved remission ([place]) 07/20/2018  . Myalgia 12/31/2016  . Obstructive sleep apnea 03/31/2015  . SOB (shortness of breath) 04/09/2016  . Stroke ([place])    TIAs  . UTI (urinary tract infection)     Patient Active Problem List   Diagnosis Date Noted  . Sensation of pressure in bladder area 01/17/2020  . Hyperglycemia 01/17/2020  . Peripheral neuropathy 11/15/2019  . Chronic migraine without aura, with intractable migraine, so stated, with status migrainosus 10/17/2019  . ETD (Eustachian tube dysfunction), bilateral 09/12/2019  . Urinary frequency 07/17/2019  . Headache 07/17/2019  . Palpitation 05/15/2019  .  Urticaria 02/22/2019  . Conjunctivitis 10/17/2018  . Diarrhea 09/04/2018  . Thyroid nodule 08/26/2018  . Drug rash 08/26/2018  . Multiple myeloma ([place]) 07/20/2018  . Multiple myeloma not having achieved remission ([place]) 07/20/2018  . Hematuria 07/11/2018  . Seasonal allergies 06/23/2018  . Right shoulder pain 06/07/2018  . Neck pain 06/07/2018  . Paresthesias 06/07/2018  . 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 Pain in thoracic spine 03/18/2018  . Thoracic back pain 02/15/2018  . 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 Cystitis 10/25/2017  . C. difficile colitis 09/15/2017  . Hypokalemia 08/20/2017  . Anemia 08/20/2017  . Elevated sed rate 08/15/2017  . 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 Epigastric pain 03/22/2017  . Myalgia 12/31/2016  . SOB (shortness of breath) 07/26/2016  . Abnormal SPEP 07/26/2016  . Low back pain 07/14/2016  . Insomnia 12/29/2015  . Tension headache 10/21/2015  . Muscle spasms of neck 10/18/2015  . Dysuria 10/13/2015  . Sinusitis 07/07/2015  . AP (abdominal pain) 07/07/2015  . Cardiomyopathy ([place]) 04/19/2015  . Chest wall pain 04/19/2015  . Obstructive sleep apnea 03/31/2015  . Congestive heart failure ([place]) 02/24/2015  . Arthritis   . Hyperlipidemia   . Hypertension   . Depression   . Stroke ([place])   . GERD (gastroesophageal reflux disease)   . Bowel obstruction ([place]) 11/09/2014    Past Surgical History:  Procedure Laterality Date  . ABDOMINAL HYSTERECTOMY  menorraghia, 2006, total  . CHOLECYSTECTOMY    . COLONOSCOPY  2018   [facility] per patient  . ESOPHAGOGASTRODUODENOSCOPY  2018   [facility]   . KNEE SURGERY     right, repair torn torn cartialage  . PACEMAKER INSERTION  08/2014  . TONSILLECTOMY    . TUBAL LIGATION       OB History   No obstetric history on file.     Family History  Problem Relation Age of Onset  . Diabetes Mother   . Hypertension Mother   .  Heart disease Mother        s/p 1 stent  . Hyperlipidemia Mother   . Arthritis Mother   . Cancer Father        COLON  . Colon cancer Father 24  . Irritable bowel syndrome Sister   . Hyperlipidemia Daughter   . Hypertension Daughter   . Hypertension Maternal Grandmother   . Arthritis Maternal Grandmother   . Heart disease Maternal Grandfather        MI  . Hypertension Maternal Grandfather   . Arthritis Maternal Grandfather   . Hypertension Son   . Esophageal cancer Neg Hx     Social History   Tobacco Use  . Smoking status: Never Smoker  . Smokeless tobacco: Never Used  Substance Use Topics  . Alcohol use: No  . Drug use: No    Home Medications Prior to Admission medications   Medication Sig Start Date End Date Taking? Authorizing Provider  acetaminophen (TYLENOL) 325 MG tablet Take 650 mg by mouth. 06/07/17   [provider]  ACETAMINOPHEN-BUTALBITAL 50-325 MG TABS Take 1 tablet by mouth 2 (two) times daily as needed. 07/13/19   Mosie Lukes, MD  albuterol (PROVENTIL HFA;VENTOLIN HFA) 108 (90 Base) MCG/ACT inhaler Inhale 2 puffs into the lungs every 6 (six) hours as needed for wheezing or shortness of breath. 08/18/18   Saguier, Percell Miller, PA-C  ALPRAZolam Duanne Moron) 0.25 MG tablet Take 1-2 tabs (0.'25mg'$ -0.'50mg'$ ) 30-60 minutes before procedure. May repeat if needed.Do not drive. 08/30/19   Melvenia Beam, MD  amLODipine (NORVASC) 10 MG tablet Take 1 tablet (10 mg total) by mouth 2 (two) times daily. 01/16/20   Mosie Lukes, MD  aspirin EC 81 MG tablet Take 1 tablet (81 mg total) by mouth daily. 02/15/18   Mosie Lukes, MD  Carboxymethylcellulose Sodium (ARTIFICIAL TEARS OP) Apply to eye.    [provider]  carvedilol (COREG) 25 MG tablet Take 1 tablet (25 mg total) by mouth 2 (two) times daily with a meal. 07/13/19   Mosie Lukes, MD  cefdinir (OMNICEF) 300 MG capsule Take 1 capsule (300 mg total) by mouth 2 (two) times daily for 7 days. 01/16/20 01/23/20  Mosie Lukes, MD  Cholecalciferol (VITAMIN D-3) 25 MCG (1000 UT) CAPS Take by mouth.    [provider]  conjugated estrogens (PREMARIN) vaginal cream Place vaginally. 01/31/14   [provider]  cyclobenzaprine (FLEXERIL) 10 MG tablet Take 10 mg by mouth as needed.     [provider]  famciclovir (FAMVIR) 250 MG tablet Take 1 tablet (250 mg total) by mouth daily. 07/20/18   [name], MD  famotidine (PEPCID) 40 MG/5ML suspension Take 5 mLs (40 mg total) by mouth at bedtime. 01/02/20   Mosie Lukes, MD  fluticasone (FLONASE) 50 MCG/ACT nasal spray Place 2 sprays into both nostrils daily. Patient taking differently: Place 2 sprays into both nostrils  as needed.  07/13/19   Mosie Lukes, MD  fluticasone (FLONASE) 50 MCG/ACT nasal spray Place 2 sprays into both nostrils daily. 01/07/20   Joy, Shawn C, PA-C  furosemide (LASIX) 20 MG tablet Take 1 tablet (20 mg total) by mouth daily as needed for fluid or edema. 07/13/19   Mosie Lukes, MD  Galcanezumab-gnlm (EMGALITY) 120 MG/ML SOAJ Inject 120 mg into the skin every 30 (thirty) days. 10/17/19   Melvenia Beam, MD  HYDROcodone-acetaminophen (NORCO/VICODIN) 5-325 MG tablet Take 1 tablet by mouth every 4 (four) hours as needed for moderate pain or severe pain. 11/21/18   Mosie Lukes, MD  hyoscyamine (LEVSIN SL) 0.125 MG SL tablet Place 1 tablet (0.125 mg total) under the tongue every 4 (four) hours as needed. 07/25/18   Mosie Lukes, MD  lidocaine (LIDODERM) 5 % Place 1 patch onto the skin daily. Remove & Discard patch within 12 hours or as directed by MD 06/07/18   Jodelle Green, FNP  loratadine (CLARITIN) 10 MG tablet Take 1 tablet (10 mg total) by mouth 2 (two) times daily as needed for allergies. 09/11/19   Mosie Lukes, MD  LORazepam (ATIVAN) 0.5 MG tablet Take 1 tablet (0.5 mg total) by mouth every 8 (eight) hours. 12/05/19   [name], MD  Melatonin 3 MG CAPS Take by mouth.    [provider]    metoCLOPramide (REGLAN) 10 MG tablet Take 1 tablet (10 mg total) by mouth 4 (four) times daily. For migraine or headache or nausea 08/30/19   Melvenia Beam, MD  montelukast (SINGULAIR) 10 MG tablet Take 1 tablet (10 mg total) by mouth at bedtime. Start taking Singulair on 11/16/2019. 10/31/19   [name], MD  Multiple Vitamins-Minerals (MULTIVITAMIN ADULT PO) Take by mouth.    [provider]  nitrofurantoin, macrocrystal-monohydrate, (MACROBID) 100 MG capsule Take 1 capsule (100 mg total) by mouth 2 (two) times daily. 01/18/20   Sherwood Gambler, MD  ondansetron (ZOFRAN) 4 MG tablet Take 1 tablet (4 mg total) by mouth every 8 (eight) hours as needed for nausea or vomiting. 04/14/18   Mosie Lukes, MD  pantoprazole (PROTONIX) 40 MG tablet Take 1 tablet (40 mg total) by mouth 2 (two) times daily. 02/15/18   Mosie Lukes, MD  pomalidomide (POMALYST) 2 MG capsule Take 1 capsule (2 mg total) by mouth daily. Every 21 days. Auth # [id number] 11/16/19   [name], MD  potassium chloride SA (K-DUR) 20 MEQ tablet Take 2 tablets (40 mEq total) by mouth once for 1 dose. 07/13/19 08/30/19  Mosie Lukes, MD  prochlorperazine (COMPAZINE) 10 MG tablet Take 1 tablet (10 mg total) by mouth every 6 (six) hours as needed (Nausea or vomiting). 12/05/19   [name], MD  promethazine (PHENERGAN) 25 MG tablet Take 1 tablet (25 mg total) by mouth every 8 (eight) hours as needed for nausea or vomiting. 09/11/19   Mosie Lukes, MD  Rimegepant Sulfate (NURTEC) 75 MG TBDP Take 75 mg by mouth daily as needed. For migraines. Take as close to onset of migraine as possible. One daily maximum. 10/17/19   Melvenia Beam, MD  sucralfate (CARAFATE) 1 g tablet Take 1 tablet (1 g total) by mouth 4 (four) times daily -  with meals and at bedtime. 06/29/18   Saguier, Percell Miller, PA-C  SUMAtriptan (IMITREX) 100 MG tablet 1 tab PO at headache onset, may repeat in 2 hrs as needed.  No more than 2 tabs per day or 4 tabs  per wk. (Can dispense #9 if quality limit) 09/19/19   [provider]  topiramate (TOPAMAX) 50 MG tablet Take 1 tablet (50 mg total) by mouth 2 (two) times daily. 07/13/19   Mosie Lukes, MD  triamterene-hydrochlorothiazide (MAXZIDE-25) 37.5-25 MG tablet Take 1 tablet by mouth daily. 09/19/19   [provider]    Allergies    Benazepril, Ondansetron hcl, Codeine, and Morphine  Review of Systems   Review of Systems  Constitutional: Negative for fever.  Gastrointestinal: Negative for abdominal pain and vomiting.  Genitourinary: Positive for decreased urine volume, dysuria and frequency.  Musculoskeletal: Positive for back pain.  All other systems reviewed and are negative.   Physical Exam Updated Vital Signs BP (!) 145/81 (BP Location: Left Arm)   Pulse 86   Temp 98.1 F (36.7 C) (Oral)   Resp 16   Ht '5\' 6"'$  (1.676 m)   Wt 88.4 kg   SpO2 98%   BMI 31.46 kg/m   Physical Exam Vitals and nursing note reviewed.  Constitutional:      Appearance: She is well-developed.  HENT:     Head: Normocephalic and atraumatic.     Right Ear: External ear normal.     Left Ear: External ear normal.     Nose: Nose normal.  Eyes:     General:        Right eye: No discharge.        Left eye: No discharge.  Cardiovascular:     Rate and Rhythm: Normal rate and regular rhythm.     Heart sounds: Normal heart sounds.  Pulmonary:     Effort: Pulmonary effort is normal.     Breath sounds: Normal breath sounds.  Abdominal:     General: There is no distension.     Palpations: Abdomen is soft.     Tenderness: There is no abdominal tenderness. There is no right CVA tenderness or left CVA tenderness.  Skin:    General: Skin is warm and dry.  Neurological:     Mental Status: She is alert.  Psychiatric:        Mood and Affect: Mood is not anxious.     ED Results / Procedures / Treatments   Labs (all labs ordered are listed, but only abnormal results are displayed) Labs  Reviewed  URINALYSIS, ROUTINE W REFLEX MICROSCOPIC - Abnormal; Notable for the following components:      Result Value   APPearance CLOUDY (*)    Specific Gravity, Urine >1.030 (*)    Leukocytes,Ua SMALL (*)    All other components within normal limits  URINALYSIS, MICROSCOPIC (REFLEX) - Abnormal; Notable for the following components:   Bacteria, UA FEW (*)    All other components within normal limits  URINE CULTURE    EKG None  Radiology No results found.  Procedures Procedures (including critical care time)  Medications Ordered in ED Medications  nitrofurantoin (macrocrystal-monohydrate) (MACROBID) capsule 100 mg (has no administration in time range)    ED Course  I have reviewed the triage vital signs and the nursing notes.  Pertinent labs & imaging results that were available during my care of the patient were reviewed by me and considered in my medical decision making (see chart for details).    MDM Rules/Calculators/A&P                      Patient has equivocal urine here  that is contaminated.  Urine culture reviewed with multiple flora from a couple days ago.  She is afebrile does not have any signs/symptoms of sepsis.  At this point with her symptoms, she could have had a false negative urine culture and will treat for UTI.  Not indicative of pyelonephritis at this time.  Discharged home with return precautions. Final Clinical Impression(s) / ED Diagnoses Final diagnoses:  Acute urinary tract infection    Rx / DC Orders ED Discharge Orders         Ordered    nitrofurantoin, macrocrystal-monohydrate, (MACROBID) 100 MG capsule  2 times daily     01/18/20 2049           Sherwood Gambler, MD 01/18/20 2051

## 2020-01-18 NOTE — ED Triage Notes (Addendum)
Pt c/o dysuria, decreased UO, lower back pain x 3 days-NAD-slow gait-chart reads pt seen for same sx by PCP 2 days ago-she states PCP did not start her on abx and was advised she would be notified in 3-4 days if needed abx

## 2020-01-18 NOTE — Discharge Instructions (Signed)
If you develop fever, vomiting, flank pain, or any other new/worsening symptoms then return to the ER for evaluation.  Otherwise follow-up with your primary care physician if no improvement.

## 2020-01-19 LAB — URINE CULTURE: Culture: NO GROWTH

## 2020-01-21 DIAGNOSIS — J018 Other acute sinusitis: Secondary | ICD-10-CM | POA: Diagnosis not present

## 2020-01-21 DIAGNOSIS — J309 Allergic rhinitis, unspecified: Secondary | ICD-10-CM | POA: Diagnosis not present

## 2020-01-21 DIAGNOSIS — H9203 Otalgia, bilateral: Secondary | ICD-10-CM | POA: Diagnosis not present

## 2020-01-21 DIAGNOSIS — G43809 Other migraine, not intractable, without status migrainosus: Secondary | ICD-10-CM | POA: Diagnosis not present

## 2020-01-23 ENCOUNTER — Other Ambulatory Visit: Payer: Medicare Other

## 2020-01-23 ENCOUNTER — Other Ambulatory Visit: Payer: Self-pay | Admitting: Otolaryngology

## 2020-01-23 ENCOUNTER — Ambulatory Visit: Payer: Medicare Other

## 2020-01-23 DIAGNOSIS — G44219 Episodic tension-type headache, not intractable: Secondary | ICD-10-CM

## 2020-01-23 DIAGNOSIS — H9203 Otalgia, bilateral: Secondary | ICD-10-CM | POA: Diagnosis not present

## 2020-01-29 DIAGNOSIS — Z1159 Encounter for screening for other viral diseases: Secondary | ICD-10-CM | POA: Diagnosis not present

## 2020-01-29 DIAGNOSIS — G43809 Other migraine, not intractable, without status migrainosus: Secondary | ICD-10-CM | POA: Diagnosis not present

## 2020-01-30 ENCOUNTER — Ambulatory Visit: Payer: Medicare Other | Admitting: Family Medicine

## 2020-01-30 ENCOUNTER — Ambulatory Visit: Payer: Medicare Other

## 2020-01-30 ENCOUNTER — Other Ambulatory Visit: Payer: Medicare Other

## 2020-02-02 ENCOUNTER — Inpatient Hospital Stay: Payer: Medicare Other | Attending: Hematology & Oncology | Admitting: Hematology & Oncology

## 2020-02-02 ENCOUNTER — Inpatient Hospital Stay: Payer: Medicare Other

## 2020-02-02 ENCOUNTER — Encounter: Payer: Self-pay | Admitting: Hematology & Oncology

## 2020-02-02 ENCOUNTER — Other Ambulatory Visit: Payer: Self-pay

## 2020-02-02 VITALS — BP 133/79 | HR 93 | Temp 97.1°F | Resp 16 | Wt 195.0 lb

## 2020-02-02 DIAGNOSIS — C9000 Multiple myeloma not having achieved remission: Secondary | ICD-10-CM | POA: Diagnosis not present

## 2020-02-02 DIAGNOSIS — I10 Essential (primary) hypertension: Secondary | ICD-10-CM | POA: Diagnosis not present

## 2020-02-02 DIAGNOSIS — G4733 Obstructive sleep apnea (adult) (pediatric): Secondary | ICD-10-CM | POA: Diagnosis not present

## 2020-02-02 DIAGNOSIS — I509 Heart failure, unspecified: Secondary | ICD-10-CM | POA: Diagnosis not present

## 2020-02-02 DIAGNOSIS — E876 Hypokalemia: Secondary | ICD-10-CM

## 2020-02-02 DIAGNOSIS — R519 Headache, unspecified: Secondary | ICD-10-CM | POA: Diagnosis present

## 2020-02-02 LAB — CMP (CANCER CENTER ONLY)
ALT: 11 U/L (ref 0–44)
AST: 17 U/L (ref 15–41)
Albumin: 3.7 g/dL (ref 3.5–5.0)
Alkaline Phosphatase: 43 U/L (ref 38–126)
Anion gap: 8 (ref 5–15)
BUN: 23 mg/dL (ref 8–23)
CO2: 24 mmol/L (ref 22–32)
Calcium: 9.5 mg/dL (ref 8.9–10.3)
Chloride: 109 mmol/L (ref 98–111)
Creatinine: 0.79 mg/dL (ref 0.44–1.00)
GFR, Est AFR Am: >60 mL/min (ref >60)
GFR, Estimated: >60 mL/min (ref >60)
Glucose, Bld: 134 mg/dL — ABNORMAL HIGH (ref 70–99)
Potassium: 3.5 mmol/L (ref 3.5–5.1)
Sodium: 141 mmol/L (ref 135–145)
Total Bilirubin: 0.4 mg/dL (ref 0.3–1.2)
Total Protein: 7.4 g/dL (ref 6.5–8.1)

## 2020-02-02 LAB — CBC WITH DIFFERENTIAL (CANCER CENTER ONLY)
Abs Immature Granulocytes: 0.02 10*3/uL (ref 0.00–0.07)
Basophils Absolute: 0 10*3/uL (ref 0.0–0.1)
Basophils Relative: 1 %
Eosinophils Absolute: 0.1 10*3/uL (ref 0.0–0.5)
Eosinophils Relative: 3 %
HCT: 37.3 % (ref 36.0–46.0)
Hemoglobin: 12.1 g/dL (ref 12.0–15.0)
Immature Granulocytes: 1 %
Lymphocytes Relative: 27 %
Lymphs Abs: 1 10*3/uL (ref 0.7–4.0)
MCH: 29.2 pg (ref 26.0–34.0)
MCHC: 32.4 g/dL (ref 30.0–36.0)
MCV: 90.1 fL (ref 80.0–100.0)
Monocytes Absolute: 0.3 10*3/uL (ref 0.1–1.0)
Monocytes Relative: 7 %
Neutro Abs: 2.3 10*3/uL (ref 1.7–7.7)
Neutrophils Relative %: 61 %
Platelet Count: 161 10*3/uL (ref 150–400)
RBC: 4.14 MIL/uL (ref 3.87–5.11)
RDW: 13.4 % (ref 11.5–15.5)
WBC Count: 3.7 10*3/uL — ABNORMAL LOW (ref 4.0–10.5)
nRBC: 0 % (ref 0.0–0.2)

## 2020-02-02 NOTE — Progress Notes (Signed)
Hematology and Oncology Follow Up Visit  Pinkey [name] [id number] 06-03-1956 64 y.o. 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  She continues to improve nicely.  She has been off therapy now for over 2 months.  She has more energy.  She is able to be more active at home.  Thankfully, her last myeloma studies done back in February showed her M spike down to 1 g/dL.  The IgG level was 2140 mg/dL.  Her kappa light chain was 3.7 mg/dL.  She is been eating well.  There is been no diarrhea.  She has had no cough.  She has had her coronavirus vaccines.  She had no problems with these.  She has had no issues with bleeding.  There is no fever.  She has had no leg swelling.  Overall, her performance status is ECOG 0.     Medications:  Allergies as of 02/02/2020      Reactions   Benazepril Anaphylaxis, Swelling   angioedema Throat and lip swelling   Ondansetron Hcl Hives   Redness and hives post IV admin on 07/05/17   Codeine Nausea And Vomiting   Morphine Hives   Redness and hives noted post IV admin on 07/05/17      Medication List       Accurate as of February 02, 2020  8:15 AM. If you have any questions, ask your nurse or doctor.        acetaminophen 325 MG tablet Commonly known as: TYLENOL Take 650 mg by mouth.   ACETAMINOPHEN-BUTALBITAL 50-325 MG Tabs Take 1 tablet by mouth 2 (two) times daily as needed.   albuterol 108 (90 Base) MCG/ACT inhaler Commonly known as: VENTOLIN HFA Inhale 2 puffs into the lungs every 6 (six) hours as needed for wheezing or shortness of breath.   ALPRAZolam 0.25 MG tablet Commonly known as: Xanax Take 1-2 tabs  (0.25mg -0.50mg ) 30-60 minutes before procedure. May repeat if needed.Do not drive.   amLODipine 10 MG tablet Commonly known as: NORVASC Take 1 tablet (10 mg total) by mouth 2 (two) times daily.   ARTIFICIAL TEARS OP Apply to eye.   aspirin EC 81 MG tablet Take 1 tablet (81 mg total) by mouth daily.   carvedilol 25 MG tablet Commonly known as: COREG Take 1 tablet (25 mg total) by mouth 2 (two) times daily with a meal.   cyclobenzaprine 10 MG tablet Commonly known as: FLEXERIL Take 10 mg by mouth as needed.   Emgality 120 MG/ML Soaj Generic drug: Galcanezumab-gnlm Inject 120 mg into the skin every 30 (thirty) days.   famciclovir 250 MG tablet Commonly known as: FAMVIR Take 1 tablet (250 mg total) by mouth daily.   famotidine 40 MG/5ML suspension Commonly known as: PEPCID Take 5 mLs (40 mg total) by mouth at bedtime.   fluticasone 50 MCG/ACT nasal spray Commonly known as: FLONASE Place 2 sprays into both nostrils daily. What changed:   when to take this  reasons to take this   fluticasone 50 MCG/ACT nasal spray Commonly known as: FLONASE Place 2 sprays into both nostrils daily. What changed: Another medication with the same name was changed. Make sure  you understand how and when to take each.   furosemide 20 MG tablet Commonly known as: LASIX Take 1 tablet (20 mg total) by mouth daily as needed for fluid or edema.   HYDROcodone-acetaminophen 5-325 MG tablet Commonly known as: NORCO/VICODIN Take 1 tablet by mouth every 4 (four) hours as needed for moderate pain or severe pain.   hyoscyamine 0.125 MG SL tablet Commonly known as: LEVSIN SL Place 1 tablet (0.125 mg total) under the tongue every 4 (four) hours as needed.   lidocaine 5 % Commonly known as: Lidoderm Place 1 patch onto the skin daily. Remove & Discard patch within 12 hours or as directed by MD   loratadine 10 MG tablet Commonly known as: CLARITIN Take 1 tablet (10 mg total) by mouth 2 (two) times  daily as needed for allergies.   LORazepam 0.5 MG tablet Commonly known as: ATIVAN Take 1 tablet (0.5 mg total) by mouth every 8 (eight) hours.   Melatonin 3 MG Caps Take by mouth.   metoCLOPramide 10 MG tablet Commonly known as: Reglan Take 1 tablet (10 mg total) by mouth 4 (four) times daily. For migraine or headache or nausea   montelukast 10 MG tablet Commonly known as: Singulair Take 1 tablet (10 mg total) by mouth at bedtime. Start taking Singulair on 11/16/2019.   MULTIVITAMIN ADULT PO Take by mouth.   nitrofurantoin (macrocrystal-monohydrate) 100 MG capsule Commonly known as: MACROBID Take 1 capsule (100 mg total) by mouth 2 (two) times daily.   Nurtec 75 MG Tbdp Generic drug: Rimegepant Sulfate Take 75 mg by mouth daily as needed. For migraines. Take as close to onset of migraine as possible. One daily maximum.   ondansetron 4 MG tablet Commonly known as: Zofran Take 1 tablet (4 mg total) by mouth every 8 (eight) hours as needed for nausea or vomiting.   pantoprazole 40 MG tablet Commonly known as: PROTONIX Take 1 tablet (40 mg total) by mouth 2 (two) times daily.   pomalidomide 2 MG capsule Commonly known as: POMALYST Take 1 capsule (2 mg total) by mouth daily. Every 21 days. Auth # [id number]   potassium chloride SA 20 MEQ tablet Commonly known as: KLOR-CON Take 2 tablets (40 mEq total) by mouth once for 1 dose.   Premarin vaginal cream Generic drug: conjugated estrogens Place vaginally.   prochlorperazine 10 MG tablet Commonly known as: COMPAZINE Take 1 tablet (10 mg total) by mouth every 6 (six) hours as needed (Nausea or vomiting).   promethazine 25 MG tablet Commonly known as: PHENERGAN Take 1 tablet (25 mg total) by mouth every 8 (eight) hours as needed for nausea or vomiting.   sucralfate 1 g tablet Commonly known as: Carafate Take 1 tablet (1 g total) by mouth 4 (four) times daily -  with meals and at bedtime.   SUMAtriptan 100 MG  tablet Commonly known as: IMITREX 1 tab PO at headache onset, may repeat in 2 hrs as needed. No more than 2 tabs per day or 4 tabs per wk. (Can dispense #9 if quality limit)   topiramate 50 MG tablet Commonly known as: Topamax Take 1 tablet (50 mg total) by mouth 2 (two) times daily.   triamterene-hydrochlorothiazide 37.5-25 MG tablet Commonly known as: MAXZIDE-25 Take 1 tablet by mouth daily.   Vitamin D-3 25 MCG (1000 UT) Caps Take by mouth.       Allergies:  Allergies  Allergen Reactions  . Benazepril Anaphylaxis and Swelling    angioedema Throat and lip  swelling  . Ondansetron Hcl Hives    Redness and hives post IV admin on 07/05/17  . Codeine Nausea And Vomiting  . Morphine Hives    Redness and hives noted post IV admin on 07/05/17    Past Medical History, Surgical history, Social history, and Family History were reviewed and updated.  Review of Systems: Review of Systems  Constitutional: Negative.   HENT: Negative.   Eyes: Negative.   Respiratory: Negative.   Cardiovascular: Negative.   Gastrointestinal: Negative.   Genitourinary: Negative.   Musculoskeletal: Negative.   Skin: Negative.   Neurological: Negative.   Endo/Heme/Allergies: Negative.   Psychiatric/Behavioral: Negative.       Physical Exam:  vitals were not taken for this visit.   Wt Readings from Last 3 Encounters:  01/18/20 194 lb 14.4 oz (88.4 kg)  01/16/20 195 lb 6.4 oz (88.6 kg)  01/07/20 189 lb (85.7 kg)    Physical Exam Vitals reviewed.  HENT:     Head: Normocephalic and atraumatic.  Eyes:     Pupils: Pupils are equal, round, and reactive to light.  Cardiovascular:     Rate and Rhythm: Normal rate and regular rhythm.     Heart sounds: Normal heart sounds.  Pulmonary:     Effort: Pulmonary effort is normal.     Breath sounds: Normal breath sounds.  Abdominal:     General: Bowel sounds are normal.     Palpations: Abdomen is soft.  Musculoskeletal:        General: No  tenderness or deformity. Normal range of motion.     Cervical back: Normal range of motion.  Lymphadenopathy:     Cervical: No cervical adenopathy.  Skin:    General: Skin is warm and dry.     Findings: No erythema or rash.  Neurological:     Mental Status: She is alert and oriented to person, place, and time.  Psychiatric:        Behavior: Behavior normal.        Thought Content: Thought content normal.        Judgment: Judgment normal.      Lab Results  Component Value Date   WBC 3.7 (L) 02/02/2020   HGB 12.1 02/02/2020   HCT 37.3 02/02/2020   MCV 90.1 02/02/2020   PLT 161 02/02/2020   Lab Results  Component Value Date   FERRITIN 120 12/07/2019   IRON 82 12/07/2019   TIBC 348 12/07/2019   UIBC 266 12/07/2019   IRONPCTSAT 24 12/07/2019   Lab Results  Component Value Date   RETICCTPCT 0.8 07/16/2016   RBC 4.14 02/02/2020   RETICCTABS 32,640 07/16/2016   Lab Results  Component Value Date   KPAFRELGTCHN 36.7 (H) 01/04/2020   LAMBDASER 3.5 (L) 01/04/2020   KAPLAMBRATIO 10.49 (H) 01/04/2020   Lab Results  Component Value Date   IGGSERUM 2,114 (H) 01/04/2020   IGA 93 01/04/2020   IGMSERUM 72 01/04/2020   Lab Results  Component Value Date   TOTALPROTELP 7.4 01/04/2020   ALBUMINELP 3.5 01/04/2020   A1GS 0.2 01/04/2020   A2GS 1.0 01/04/2020   BETS 1.1 01/04/2020   BETA2SER 0.5 07/16/2016   GAMS 1.7 01/04/2020   MSPIKE 1.0 (H) 01/04/2020   SPEI Comment 12/07/2019     Chemistry      Component Value Date/Time   NA 142 01/16/2020 0954   NA 143 11/08/2017 1127   NA 140 12/31/2016 1000   K 3.4 (L) 01/16/2020 0954   K 4.0  11/08/2017 1127   K 3.6 12/31/2016 1000   CL 107 01/16/2020 0954   CL 107 11/08/2017 1127   CO2 29 01/16/2020 0954   CO2 25 11/08/2017 1127   CO2 24 12/31/2016 1000   BUN 18 01/16/2020 0954   BUN 19 11/08/2017 1127   BUN 22.3 12/31/2016 1000   CREATININE 0.72 01/16/2020 0954   CREATININE 0.72 01/04/2020 0751   CREATININE 0.9  11/08/2017 1127   CREATININE 0.8 12/31/2016 1000      Component Value Date/Time   CALCIUM 8.8 01/16/2020 0954   CALCIUM 9.9 11/08/2017 1127   CALCIUM 9.5 12/31/2016 1000   ALKPHOS 47 01/16/2020 0954   ALKPHOS 44 11/08/2017 1127   ALKPHOS 51 12/31/2016 1000   AST 14 01/16/2020 0954   AST 17 01/04/2020 0751   AST 28 12/31/2016 1000   ALT 11 01/16/2020 0954   ALT 12 01/04/2020 0751   ALT 36 11/08/2017 1127   ALT 16 12/31/2016 1000   BILITOT 0.5 01/16/2020 0954   BILITOT 0.5 01/04/2020 0751   BILITOT 0.46 12/31/2016 1000       Impression and Plan: Tami [name] is a very pleasant 64 yo African American female with a previous IgG kappa MGUS with progression to myeloma.  Since her headache is doing so much better, I think we need to permanently discontinue the daratumumab.  This is a unusual side effect from daratumumab.  I do not see any need that we have to embark upon therapy with her right now.  We will plan to get her back in 2 months.  As long as we do not see the myeloma levels going up, I really do not see a need to embark upon therapy for her.  [name], MD 3/26/20218:15 AM

## 2020-02-03 LAB — IGG, IGA, IGM
IgA: 75 mg/dL — ABNORMAL LOW (ref 87–352)
IgG (Immunoglobin G), Serum: 1711 mg/dL — ABNORMAL HIGH (ref 586–1602)
IgM (Immunoglobulin M), Srm: 56 mg/dL (ref 26–217)

## 2020-02-05 LAB — KAPPA/LAMBDA LIGHT CHAINS
Kappa free light chain: 38.1 mg/L — ABNORMAL HIGH (ref 3.3–19.4)
Kappa, lambda light chain ratio: 6.05 — ABNORMAL HIGH (ref 0.26–1.65)
Lambda free light chains: 6.3 mg/L (ref 5.7–26.3)

## 2020-02-06 DIAGNOSIS — R42 Dizziness and giddiness: Secondary | ICD-10-CM | POA: Diagnosis not present

## 2020-02-08 DIAGNOSIS — G43809 Other migraine, not intractable, without status migrainosus: Secondary | ICD-10-CM | POA: Diagnosis not present

## 2020-02-08 DIAGNOSIS — I10 Essential (primary) hypertension: Secondary | ICD-10-CM | POA: Diagnosis not present

## 2020-02-08 LAB — IMMUNOFIXATION REFLEX, SERUM
IgA: 93 mg/dL (ref 87–352)
IgG (Immunoglobin G), Serum: 2050 mg/dL — ABNORMAL HIGH (ref 586–1602)
IgM (Immunoglobulin M), Srm: 64 mg/dL (ref 26–217)

## 2020-02-08 LAB — PROTEIN ELECTROPHORESIS, SERUM, WITH REFLEX
A/G Ratio: 0.8 (ref 0.7–1.7)
Albumin ELP: 3.3 g/dL (ref 2.9–4.4)
Alpha-1-Globulin: 0.2 g/dL (ref 0.0–0.4)
Alpha-2-Globulin: 1 g/dL (ref 0.4–1.0)
Beta Globulin: 1.1 g/dL (ref 0.7–1.3)
Gamma Globulin: 1.6 g/dL (ref 0.4–1.8)
Globulin, Total: 4 g/dL — ABNORMAL HIGH (ref 2.2–3.9)
M-Spike, %: 0.9 g/dL — ABNORMAL HIGH
SPEP Interpretation: 0
Total Protein ELP: 7.3 g/dL (ref 6.0–8.5)

## 2020-02-10 DIAGNOSIS — Z1231 Encounter for screening mammogram for malignant neoplasm of breast: Secondary | ICD-10-CM | POA: Diagnosis not present

## 2020-02-10 DIAGNOSIS — Z950 Presence of cardiac pacemaker: Secondary | ICD-10-CM | POA: Diagnosis not present

## 2020-02-10 DIAGNOSIS — R110 Nausea: Secondary | ICD-10-CM | POA: Diagnosis not present

## 2020-02-10 DIAGNOSIS — G43809 Other migraine, not intractable, without status migrainosus: Secondary | ICD-10-CM | POA: Diagnosis not present

## 2020-02-12 ENCOUNTER — Other Ambulatory Visit: Payer: Self-pay

## 2020-02-12 DIAGNOSIS — R519 Headache, unspecified: Secondary | ICD-10-CM | POA: Diagnosis not present

## 2020-02-13 ENCOUNTER — Telehealth: Payer: Self-pay | Admitting: *Deleted

## 2020-02-13 ENCOUNTER — Other Ambulatory Visit: Payer: Self-pay | Admitting: *Deleted

## 2020-02-13 ENCOUNTER — Ambulatory Visit: Payer: Medicare Other | Admitting: Medical

## 2020-02-13 DIAGNOSIS — E876 Hypokalemia: Secondary | ICD-10-CM

## 2020-02-13 NOTE — Telephone Encounter (Signed)
Pt has lab appointment on Thursday but I do not see any future orders in Epic. Please place orders if appropriate.

## 2020-02-13 NOTE — Telephone Encounter (Signed)
CMP, future order placed.

## 2020-02-15 ENCOUNTER — Other Ambulatory Visit (INDEPENDENT_AMBULATORY_CARE_PROVIDER_SITE_OTHER): Payer: Medicare Other

## 2020-02-15 ENCOUNTER — Other Ambulatory Visit: Payer: Self-pay

## 2020-02-15 DIAGNOSIS — E876 Hypokalemia: Secondary | ICD-10-CM | POA: Diagnosis not present

## 2020-02-15 LAB — COMPREHENSIVE METABOLIC PANEL WITH GFR
ALT: 14 U/L (ref 0–35)
AST: 23 U/L (ref 0–37)
Albumin: 3.8 g/dL (ref 3.5–5.2)
Alkaline Phosphatase: 48 U/L (ref 39–117)
BUN: 16 mg/dL (ref 6–23)
CO2: 26 meq/L (ref 19–32)
Calcium: 8.9 mg/dL (ref 8.4–10.5)
Chloride: 105 meq/L (ref 96–112)
Creatinine, Ser: 0.73 mg/dL (ref 0.40–1.20)
GFR: 96.6 mL/min (ref >60.00)
Glucose, Bld: 118 mg/dL — ABNORMAL HIGH (ref 70–99)
Potassium: 4.4 meq/L (ref 3.5–5.1)
Sodium: 139 meq/L (ref 135–145)
Total Bilirubin: 0.5 mg/dL (ref 0.2–1.2)
Total Protein: 7.3 g/dL (ref 6.0–8.3)

## 2020-02-16 DIAGNOSIS — G43809 Other migraine, not intractable, without status migrainosus: Secondary | ICD-10-CM | POA: Diagnosis not present

## 2020-02-16 DIAGNOSIS — H6690 Otitis media, unspecified, unspecified ear: Secondary | ICD-10-CM | POA: Diagnosis not present

## 2020-02-18 DIAGNOSIS — J0100 Acute maxillary sinusitis, unspecified: Secondary | ICD-10-CM | POA: Diagnosis not present

## 2020-02-18 DIAGNOSIS — J309 Allergic rhinitis, unspecified: Secondary | ICD-10-CM | POA: Diagnosis not present

## 2020-02-18 DIAGNOSIS — R519 Headache, unspecified: Secondary | ICD-10-CM | POA: Diagnosis not present

## 2020-02-18 DIAGNOSIS — H65191 Other acute nonsuppurative otitis media, right ear: Secondary | ICD-10-CM | POA: Diagnosis not present

## 2020-02-18 DIAGNOSIS — H9203 Otalgia, bilateral: Secondary | ICD-10-CM | POA: Diagnosis not present

## 2020-02-18 DIAGNOSIS — J0110 Acute frontal sinusitis, unspecified: Secondary | ICD-10-CM | POA: Diagnosis not present

## 2020-02-18 DIAGNOSIS — R42 Dizziness and giddiness: Secondary | ICD-10-CM | POA: Diagnosis not present

## 2020-02-18 DIAGNOSIS — H748X1 Other specified disorders of right middle ear and mastoid: Secondary | ICD-10-CM | POA: Diagnosis not present

## 2020-02-19 ENCOUNTER — Ambulatory Visit (INDEPENDENT_AMBULATORY_CARE_PROVIDER_SITE_OTHER): Payer: Medicare Other | Admitting: Medical

## 2020-02-19 ENCOUNTER — Other Ambulatory Visit: Payer: Self-pay

## 2020-02-19 VITALS — BP 149/88 | HR 79 | Temp 96.2°F | Resp 18 | Ht 66.0 in | Wt 197.4 lb

## 2020-02-19 DIAGNOSIS — G44209 Tension-type headache, unspecified, not intractable: Secondary | ICD-10-CM | POA: Diagnosis not present

## 2020-02-19 DIAGNOSIS — J3489 Other specified disorders of nose and nasal sinuses: Secondary | ICD-10-CM | POA: Diagnosis not present

## 2020-02-19 DIAGNOSIS — J302 Other seasonal allergic rhinitis: Secondary | ICD-10-CM

## 2020-02-19 DIAGNOSIS — H9203 Otalgia, bilateral: Secondary | ICD-10-CM

## 2020-02-19 DIAGNOSIS — I10 Essential (primary) hypertension: Secondary | ICD-10-CM

## 2020-02-19 MED ORDER — CYCLOBENZAPRINE HCL 10 MG PO TABS: 10.0000 mg | ORAL_TABLET | Freq: Every day | ORAL | 0 refills | Status: DC

## 2020-02-19 MED ORDER — KETOROLAC TROMETHAMINE 60 MG/2ML IM SOLN
60.0000 mg | Freq: Once | INTRAMUSCULAR | Status: AC
  Administered 2020-02-19: 60 mg via INTRAMUSCULAR

## 2020-02-19 NOTE — Patient Instructions (Addendum)
You had recent visits with ENT, urgent care and emergency department.  These visits were  for your ear pain, sinus pressure and headaches.  In addition history of migraine headaches for which neurologist has been evaluated.  Presently her blood pressure is high but your blood pressure was in the lower range yesterday.  Blood pressure could be high today based on your moderate to severe level headache.  You  had good neurologic exam today.  Will give you low-dose Toradol 30 mg IM today in the office.  Also will give you Flexeril 10 mg tabs to use at night in the event of tension type headaches.  Some trapezius tenderness to palpation on exam.  I do want you to follow-up with your neurologist in early May.  Per last note review it appears as when they wanted you to see them.  Also want you to talk with imaging  downstairs to see if your MRI of head was already authorized.  If so then please get scheduled.  If not precertified then please call ENTs office and asked them to get that precertified and scheduled.  This is very important aspect of evaluating your pain, sinuses and your overall headache description.  Please update me tomorrow morning on if your headache level has decreased.  Also update me on blood pressure level reading.  For ear pain and sinus pain continue with the Flonase nasal spray and continue with antibiotic Augmentin.   Time spent with patient today was   minutes which consisted of chart revdiew, discussing diagnosis, work up treatment and documentation. Glad to hear that your dizziness has resolved with meclizine.  You can use that if dizziness reoccurs.  See if recurrent dizziness, increasing headache, any gross motor or sensory function deficits and recommend being evaluated emergency department again.  Follow-up in 10 days or as needed.

## 2020-02-19 NOTE — Progress Notes (Signed)
Subjective:    [name], female    DOB: [birth year], 64 y.o.   MRN: [id number]  HPI  Pt in for evaluation.   She states she has been struggling with both ear aches and headaches for 2 weeks. Signs and symptoms started before easter.    Before easter she went to UC They gave her prednisone and augmentin. Pt states last dose of prednisone was Saturday. She also got toradol in UC. Pt was also giving augmentin.   Pt went to ED they advised meclizine for dizziness. The dizziness has resolved. Pt got rx of flonase.  They gave her decadron 10 mg im yesterday in ED.   On review of UC note she was treated for sinusitis. On review of note 6 months ago. Some yellow drainage from her nose.  Pt was seen by ENT January 23, 2020. MRI with and without contrast was ordered by Dr. Janace Hoard. Portion of ENT note mentioned neck muscle maybe associated with  ha.   Current level ha 8/10. No Gross motor or sensory function deficits. This morning bp 150/80.  Pt has seen Dr. Jaynee Eagles and NP. Should follow up in early May.      Review of Systems  Constitutional: Negative for chills, fatigue and fever.  Respiratory: Negative for cough, chest tightness, shortness of breath and wheezing.   Cardiovascular: Negative for chest pain and palpitations.  Gastrointestinal: Negative for abdominal pain, blood in stool, diarrhea and nausea.  Musculoskeletal: Negative for back pain, myalgias and neck pain.  Skin: Negative for rash.  Neurological: Negative for dizziness, speech difficulty, weakness, light-headedness and headaches.  Hematological: Negative for adenopathy. Does not bruise/bleed easily.  Psychiatric/Behavioral: Negative for behavioral problems, confusion and sleep disturbance. The patient is not nervous/anxious.     Past Medical History:  Diagnosis Date  . Abnormal SPEP 07/26/2016  . Arthritis    knees, hands  . Back pain 07/14/2016  . Bowel obstruction ([place]) 11/2014  . C. difficile diarrhea     . CHF (congestive heart failure) ([place])   . Colitis   . Congestive heart failure ([place]) 02/24/2015   S/p pacemaker  . Depression   . Elevated sed rate 08/15/2017  . Epigastric pain 03/22/2017  . GERD (gastroesophageal reflux disease)   . Heart disease   . Hyperlipidemia   . Hypertension   . 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 Low back pain 07/14/2016  . 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 Multiple myeloma ([place]) 07/20/2018  . Multiple myeloma not having achieved remission ([place]) 07/20/2018  . Myalgia 12/31/2016  . Obstructive sleep apnea 03/31/2015  . SOB (shortness of breath) 04/09/2016  . Stroke ([place])    TIAs  . UTI (urinary tract infection)      Social History   Socioeconomic History  . Marital status: Divorced    Spouse name: [name]  . Number of children: 3  . Years of education: [name]  . Highest education level: High school graduate  Occupational History  . [name]  Tobacco Use  . Smoking status: Never Smoker  . Smokeless tobacco: Never Used  Substance and Sexual Activity  . Alcohol use: No  . Drug use: No  . Sexual activity: [name]  Other Topics Concern  . [name]  Social History Narrative   Lives at home alone   Retired   Caffeine: coffee   Social Determinants of Radio broadcast assistant Strain:   .  Difficulty of Paying Living Expenses:   Food Insecurity:   . Worried About Charity fundraiser in the Last Year:   . Arboriculturist in the Last Year:   Transportation Needs:   . Film/video editor (Medical):   Marland Kitchen Lack of Transportation (Non-Medical):   Physical Activity:   . Days of Exercise per Week:   . Minutes of Exercise per Session:   Stress:   . Feeling of Stress :   Social Connections:   . Frequency of Communication with Friends and Family:   . Frequency of Social Gatherings with Friends and Family:   . Attends Religious Services:   . Active Member of Clubs or Organizations:   . Attends  Archivist Meetings:   Marland Kitchen Marital Status:   Intimate Partner Violence:   . Fear of Current or Ex-Partner:   . Emotionally Abused:   Marland Kitchen Physically Abused:   . Sexually Abused:     Past Surgical History:  Procedure Laterality Date  . ABDOMINAL HYSTERECTOMY     menorraghia, 2006, total  . CHOLECYSTECTOMY    . COLONOSCOPY  2018   [facility] per patient  . ESOPHAGOGASTRODUODENOSCOPY  2018   [facility]   . KNEE SURGERY     right, repair torn torn cartialage  . PACEMAKER INSERTION  08/2014  . TONSILLECTOMY    . TUBAL LIGATION      Family History  Problem Relation Age of Onset  . Diabetes Mother   . Hypertension Mother   . Heart disease Mother        s/p 1 stent  . Hyperlipidemia Mother   . Arthritis Mother   . Cancer Father        COLON  . Colon cancer Father 66  . Irritable bowel syndrome Sister   . Hyperlipidemia Daughter   . Hypertension Daughter   . Hypertension Maternal Grandmother   . Arthritis Maternal Grandmother   . Heart disease Maternal Grandfather        MI  . Hypertension Maternal Grandfather   . Arthritis Maternal Grandfather   . Hypertension Son   . Esophageal cancer Neg Hx     Allergies  Allergen Reactions  . Benazepril Anaphylaxis and Swelling    angioedema Throat and lip swelling  . Ondansetron Hcl Hives    Redness and hives post IV admin on 07/05/17  . Codeine Nausea And Vomiting  . Morphine Hives    Redness and hives noted post IV admin on 07/05/17    Current Outpatient Medications on File Prior to Visit  Medication Sig Dispense Refill  . acetaminophen (TYLENOL) 325 MG tablet Take 650 mg by mouth.    . ACETAMINOPHEN-BUTALBITAL 50-325 MG TABS Take 1 tablet by mouth 2 (two) times daily as needed. 120 tablet 1  . albuterol (PROVENTIL HFA;VENTOLIN HFA) 108 (90 Base) MCG/ACT inhaler Inhale 2 puffs into the lungs every 6 (six) hours as needed for wheezing or shortness of breath. 1 Inhaler 2  . ALPRAZolam (XANAX)  0.25 MG tablet Take 1-2 tabs (0.'25mg'$ -0.'50mg'$ ) 30-60 minutes before procedure. May repeat if needed.Do not drive. 4 tablet 0  . amLODipine (NORVASC) 10 MG tablet Take 1 tablet (10 mg total) by mouth 2 (two) times daily. 180 tablet 1  . aspirin EC 81 MG tablet Take 1 tablet (81 mg total) by mouth daily. 90 tablet 1  . Carboxymethylcellulose Sodium (ARTIFICIAL TEARS OP) Apply to eye.    . carvedilol (COREG) 25 MG tablet Take 1  tablet (25 mg total) by mouth 2 (two) times daily with a meal. 180 tablet 1  . cetirizine (ZYRTEC) 10 MG tablet Take 10 mg by mouth daily.    . Cholecalciferol (VITAMIN D-3) 25 MCG (1000 UT) CAPS Take by mouth.    . conjugated estrogens (PREMARIN) vaginal cream Place vaginally.    . cyclobenzaprine (FLEXERIL) 10 MG tablet Take 10 mg by mouth as needed.     . famciclovir (FAMVIR) 250 MG tablet Take 1 tablet (250 mg total) by mouth daily. 30 tablet 12  . famotidine (PEPCID) 40 MG/5ML suspension Take 5 mLs (40 mg total) by mouth at bedtime. 150 mL 5  . fluticasone (FLONASE) 50 MCG/ACT nasal spray Place 2 sprays into both nostrils daily. (Patient taking differently: Place 2 sprays into both nostrils as needed. ) 16 g 1  . fluticasone (FLONASE) 50 MCG/ACT nasal spray Place 2 sprays into both nostrils daily. 16 g 0  . furosemide (LASIX) 20 MG tablet Take 1 tablet (20 mg total) by mouth daily as needed for fluid or edema. 30 tablet 2  . Galcanezumab-gnlm (EMGALITY) 120 MG/ML SOAJ Inject 120 mg into the skin every 30 (thirty) days. 1 pen 11  . HYDROcodone-acetaminophen (NORCO/VICODIN) 5-325 MG tablet Take 1 tablet by mouth every 4 (four) hours as needed for moderate pain or severe pain. 20 tablet 0  . hyoscyamine (LEVSIN SL) 0.125 MG SL tablet Place 1 tablet (0.125 mg total) under the tongue every 4 (four) hours as needed. 30 tablet 2  . lidocaine (LIDODERM) 5 % Place 1 patch onto the skin daily. Remove & Discard patch within 12 hours or as directed by MD 30 patch 2  . loratadine  (CLARITIN) 10 MG tablet Take 1 tablet (10 mg total) by mouth 2 (two) times daily as needed for allergies. 30 tablet 11  . LORazepam (ATIVAN) 0.5 MG tablet Take 1 tablet (0.5 mg total) by mouth every 8 (eight) hours. 30 tablet 0  . Melatonin 3 MG CAPS Take by mouth.    . metoCLOPramide (REGLAN) 10 MG tablet Take 1 tablet (10 mg total) by mouth 4 (four) times daily. For migraine or headache or nausea 30 tablet 6  . montelukast (SINGULAIR) 10 MG tablet Take 1 tablet (10 mg total) by mouth at bedtime. Start taking Singulair on 11/16/2019. 20 tablet 0  . Multiple Vitamins-Minerals (MULTIVITAMIN ADULT PO) Take by mouth.    . nitrofurantoin, macrocrystal-monohydrate, (MACROBID) 100 MG capsule Take 1 capsule (100 mg total) by mouth 2 (two) times daily. 9 capsule 0  . ondansetron (ZOFRAN) 4 MG tablet Take 1 tablet (4 mg total) by mouth every 8 (eight) hours as needed for nausea or vomiting. 30 tablet 1  . pantoprazole (PROTONIX) 40 MG tablet Take 1 tablet (40 mg total) by mouth 2 (two) times daily. 180 tablet 1  . pomalidomide (POMALYST) 2 MG capsule Take 1 capsule (2 mg total) by mouth daily. Every 21 days. Auth # [id number] 21 capsule 0  . prochlorperazine (COMPAZINE) 10 MG tablet Take 1 tablet (10 mg total) by mouth every 6 (six) hours as needed (Nausea or vomiting). 30 tablet 1  . promethazine (PHENERGAN) 25 MG tablet Take 1 tablet (25 mg total) by mouth every 8 (eight) hours as needed for nausea or vomiting. 30 tablet 2  . Rimegepant Sulfate (NURTEC) 75 MG TBDP Take 75 mg by mouth daily as needed. For migraines. Take as close to onset of migraine as possible. One daily maximum. 10 tablet 6  .  sucralfate (CARAFATE) 1 g tablet Take 1 tablet (1 g total) by mouth 4 (four) times daily -  with meals and at bedtime. 120 tablet 0  . SUMAtriptan (IMITREX) 100 MG tablet 1 tab PO at headache onset, may repeat in 2 hrs as needed. No more than 2 tabs per day or 4 tabs per wk. (Can dispense #9 if quality limit)    .  topiramate (TOPAMAX) 50 MG tablet Take 1 tablet (50 mg total) by mouth 2 (two) times daily. 60 tablet 3  . triamterene-hydrochlorothiazide (MAXZIDE-25) 37.5-25 MG tablet Take 1 tablet by mouth daily.    . potassium chloride SA (K-DUR) 20 MEQ tablet Take 2 tablets (40 mEq total) by mouth once for 1 dose. 60 tablet 3   No current facility-administered medications on file prior to visit.    BP (!) 149/88 (BP Location: Left Arm, Patient Position: Sitting, Cuff Size: Large)   Pulse 79   Temp (!) 96.2 F (35.7 C) (Temporal)   Resp 18   Ht '5\' 6"'$  (1.676 m)   Wt 197 lb 6.4 oz (89.5 kg)   SpO2 98%   BMI 31.86 kg/m       Objective:   Physical Exam  General Mental Status- Alert. General Appearance- Not in acute distress.   Heent- frontal and maxillary sinus pressure. Ears- canal both clear. Tm look normal.   Skin General: Color- Normal Color. Moisture- Normal Moisture.  Neck Carotid Arteries- Normal color. Moisture- Normal Moisture. No carotid bruits. No JVD.  Chest and Lung Exam Auscultation: Breath Sounds:-Normal.  Cardiovascular Auscultation:Rythm- Regular. Murmurs & Other Heart Sounds:Auscultation of the heart reveals- No Murmurs.  Abdomen Inspection:-Inspeection Normal. Palpation/Percussion:Note:No mass. Palpation and Percussion of the abdomen reveal- Non Tender, Non Distended + BS, no rebound or guarding.    Neurologic Cranial Nerve exam:- CN III-XII intact(No nystagmus), symmetric smile. Drift Test:- No drift. Finger to Nose:- Normal/Intact Strength:- 5/5 equal and symmetric strength both upper and lower extremities.      Assessment & Plan:  You had recent visits with ENT, urgent care and emergency department.  These visits were for your ear pain, sinus pressure and headaches.  In addition history of migraine headaches for which neurologist has been evaluated.  Presently her blood pressure is high but your blood pressure was in the lower range yesterday.  Blood  pressure could be high today based on your moderate to severe level headache.  You  had good neurologic exam today.  Will give you low-dose Toradol 30 mg IM today in the office.  Also will give you Flexeril 10 mg tabs to use at night in the event of tension type headaches.  Some trapezius tenderness to palpation on exam.  I do want you to follow-up with your neurologist in early May.  Per last note review it appears as when they wanted you to see them.  Also want you to talk with imaging I downstairs to see if your MRI of head was already authorized.  If so then please get scheduled.  If not precertify then please call ENTs office and asked them to get that precertified and scheduled.  This is very important aspect of evaluating your pain, sinuses and your overall headache description.  Please update me tomorrow morning on if your headache level has decreased.  Also update me on blood pressure level reading.  For ear pain and sinus pain continue with the Flonase nasal spray and continue with antibiotic Augmentin.  Glad to hear that your dizziness  has resolved with meclizine.  You can use that if dizziness reoccurs.  See if recurrent dizziness, increasing headache, any gross motor or sensory function deficits and recommend being evaluated emergency department again.  Follow-up in 10 days or as needed.  Time spent with patient today was  40   minutes which consisted of chart review(UC, ED, ENT and neurology notes), discussing diagnosis, work up(need for [facility]) treatment plans  and documentation.

## 2020-02-20 ENCOUNTER — Telehealth: Payer: Self-pay | Admitting: Family Medicine

## 2020-02-20 DIAGNOSIS — R112 Nausea with vomiting, unspecified: Secondary | ICD-10-CM | POA: Diagnosis not present

## 2020-02-20 DIAGNOSIS — H9209 Otalgia, unspecified ear: Secondary | ICD-10-CM | POA: Diagnosis not present

## 2020-02-20 DIAGNOSIS — R61 Generalized hyperhidrosis: Secondary | ICD-10-CM | POA: Diagnosis not present

## 2020-02-20 DIAGNOSIS — R509 Fever, unspecified: Secondary | ICD-10-CM | POA: Diagnosis not present

## 2020-02-20 DIAGNOSIS — R519 Headache, unspecified: Secondary | ICD-10-CM | POA: Diagnosis not present

## 2020-02-20 NOTE — Telephone Encounter (Signed)
She needs to go to [facility] or ED. Not sure what is going on. If super high bp, ha and dizzy she may need stat scan and med to control bp.

## 2020-02-20 NOTE — Telephone Encounter (Signed)
189/90 -- BP taken while we were on the phone & she is experiencing dizziness and headache states its like "her balance is off " , no fatigue but states she was unable to sleep last night. And no other symptoms

## 2020-02-20 NOTE — Telephone Encounter (Signed)
Caller : Tami [name]  Call Back # [phone]   Patient instructed to call office back to give Blood Pressure Readings:   Blood Pressure  Reading : Today around 7:00  BP Reading : 88/84 Pulse: 79  Patient states dizziness this morning.   Please advise

## 2020-02-20 NOTE — Telephone Encounter (Signed)
This bp is dramatically different from yesterday bp which was high? Now very low? Have her check again and see if dizziness persisting. Any other symptoms. Does she have ha? Is she very fatigued?   Let me know what she says.

## 2020-02-20 NOTE — Telephone Encounter (Signed)
Patient called again and stated she did her BP again after we got off the phone and it was 146/92 pulse 83  139/93 pulse 80.

## 2020-02-20 NOTE — Telephone Encounter (Signed)
Patient notified

## 2020-02-22 DIAGNOSIS — Z1231 Encounter for screening mammogram for malignant neoplasm of breast: Secondary | ICD-10-CM | POA: Diagnosis not present

## 2020-02-23 DIAGNOSIS — Z09 Encounter for follow-up examination after completed treatment for conditions other than malignant neoplasm: Secondary | ICD-10-CM | POA: Diagnosis not present

## 2020-02-23 DIAGNOSIS — H6983 Other specified disorders of Eustachian tube, bilateral: Secondary | ICD-10-CM | POA: Diagnosis not present

## 2020-02-23 DIAGNOSIS — J0110 Acute frontal sinusitis, unspecified: Secondary | ICD-10-CM | POA: Diagnosis not present

## 2020-02-25 DIAGNOSIS — G43809 Other migraine, not intractable, without status migrainosus: Secondary | ICD-10-CM | POA: Diagnosis not present

## 2020-02-25 DIAGNOSIS — Z03818 Encounter for observation for suspected exposure to other biological agents ruled out: Secondary | ICD-10-CM | POA: Diagnosis not present

## 2020-02-26 ENCOUNTER — Other Ambulatory Visit: Payer: Self-pay

## 2020-02-26 ENCOUNTER — Ambulatory Visit (INDEPENDENT_AMBULATORY_CARE_PROVIDER_SITE_OTHER): Payer: Medicare Other | Admitting: Family Medicine

## 2020-02-26 ENCOUNTER — Encounter: Payer: Self-pay | Admitting: Family Medicine

## 2020-02-26 VITALS — BP 154/91 | HR 79

## 2020-02-26 DIAGNOSIS — E785 Hyperlipidemia, unspecified: Secondary | ICD-10-CM | POA: Diagnosis not present

## 2020-02-26 DIAGNOSIS — M546 Pain in thoracic spine: Secondary | ICD-10-CM | POA: Diagnosis not present

## 2020-02-26 DIAGNOSIS — I10 Essential (primary) hypertension: Secondary | ICD-10-CM | POA: Diagnosis not present

## 2020-02-26 DIAGNOSIS — R739 Hyperglycemia, unspecified: Secondary | ICD-10-CM

## 2020-02-26 DIAGNOSIS — R700 Elevated erythrocyte sedimentation rate: Secondary | ICD-10-CM

## 2020-02-26 MED ORDER — NAPROXEN 375 MG PO TBEC: 375.0000 mg | DELAYED_RELEASE_TABLET | Freq: Every morning | ORAL | 2 refills | Status: DC

## 2020-02-26 NOTE — Assessment & Plan Note (Signed)
Encouraged moist heat and gentle stretching as tolerated. May try NSAIDs and prescription meds as directed and report if symptoms worsen or seek immediate care. Xray ordered she notes she got some relief from Toradol in ER so is allowed Naproxen 375 mg po bid prn to take as needed with food.

## 2020-02-26 NOTE — Assessment & Plan Note (Signed)
Repeat Sed rate today

## 2020-02-26 NOTE — Assessment & Plan Note (Signed)
Elevated secondary to pain, she will monitor and report if worsensno changes to meds. Encouraged heart healthy diet such as the DASH diet and exercise as tolerated.

## 2020-02-26 NOTE — Assessment & Plan Note (Signed)
hgba1c acceptable, minimize simple carbs. Increase exercise as tolerated.  

## 2020-02-26 NOTE — Progress Notes (Signed)
Virtual Visit via Phone Note  I connected with Tami [name] on 02/26/20 at  2:00 PM EDT by a phone enabled telemedicine application and verified that I am speaking with the correct person using two identifiers.  Location: Patient: home Provider: office   I discussed the limitations of evaluation and management by telemedicine and the availability of in person appointments. The patient expressed understanding and agreed to proceed. Kem Boroughs, CMA was able to get him set up on a phone visit after being unable to set up on a video visit.     Subjective:    [name], female    DOB: Mar 26, 1956, 64 y.o.   MRN: [id number]  Chief Complaint  Patient presents with  . Ear Pain    fluid in both ears (went to urgent care on Sunday)  . Headache  . Dizziness    HPI Patient is in today for follow up on chronic medical concerns and after an ER visit. She presented to ER with thoracic back pain and work up was unremarkable and she did get some relief with Toradol. Notes fatigue, nausea and malaise. She is frustrated by her pain, worse with certain movements. No falls or trauma. Denies CP/palp/SOB/HA/congestion/fevers or GU c/o. Taking meds as prescribed. Has noted some nausea  Past Medical History:  Diagnosis Date  . Abnormal SPEP 07/26/2016  . Arthritis    knees, hands  . Back pain 07/14/2016  . Bowel obstruction ([place]) 11/2014  . C. difficile diarrhea   . CHF (congestive heart failure) ([place])   . Colitis   . Congestive heart failure ([place]) 02/24/2015   S/p pacemaker  . Depression   . Elevated sed rate 08/15/2017  . Epigastric pain 03/22/2017  . GERD (gastroesophageal reflux disease)   . Heart disease   . Hyperlipidemia   . Hypertension   . 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 Low back pain 07/14/2016  . 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 Multiple myeloma ([place]) 07/20/2018  . Multiple myeloma not having achieved remission ([place])  07/20/2018  . Myalgia 12/31/2016  . Obstructive sleep apnea 03/31/2015  . SOB (shortness of breath) 04/09/2016  . Stroke ([place])    TIAs  . UTI (urinary tract infection)     Past Surgical History:  Procedure Laterality Date  . ABDOMINAL HYSTERECTOMY     menorraghia, 2006, total  . CHOLECYSTECTOMY    . COLONOSCOPY  2018   [facility] per patient  . ESOPHAGOGASTRODUODENOSCOPY  2018   [facility]   . KNEE SURGERY     right, repair torn torn cartialage  . PACEMAKER INSERTION  08/2014  . TONSILLECTOMY    . TUBAL LIGATION      Family History  Problem Relation Age of Onset  . Diabetes Mother   . Hypertension Mother   . Heart disease Mother        s/p 1 stent  . Hyperlipidemia Mother   . Arthritis Mother   . Cancer Father        COLON  . Colon cancer Father 35  . Irritable bowel syndrome Sister   . Hyperlipidemia Daughter   . Hypertension Daughter   . Hypertension Maternal Grandmother   . Arthritis Maternal Grandmother   . Heart disease Maternal Grandfather        MI  . Hypertension Maternal Grandfather   . Arthritis Maternal Grandfather   . Hypertension Son   . Esophageal cancer Neg Hx     Social History  Socioeconomic History  . Marital status: Divorced    Spouse name: [name]  . Number of children: 3  . Years of education: [name]  . Highest education level: High school graduate  Occupational History  . [name]  Tobacco Use  . Smoking status: Never Smoker  . Smokeless tobacco: Never Used  Substance and Sexual Activity  . Alcohol use: No  . Drug use: No  . Sexual activity: [name]  Other Topics Concern  . [name]  Social History Narrative   Lives at home alone   Retired   Caffeine: coffee   Social Determinants of Health   Financial Resource Strain:   . Difficulty of Paying Living Expenses:   Food Insecurity:   . Worried About Charity fundraiser in the Last Year:   . Arboriculturist in the Last Year:     Transportation Needs:   . Film/video editor (Medical):   Marland Kitchen Lack of Transportation (Non-Medical):   Physical Activity:   . Days of Exercise per Week:   . Minutes of Exercise per Session:   Stress:   . Feeling of Stress :   Social Connections:   . Frequency of Communication with Friends and Family:   . Frequency of Social Gatherings with Friends and Family:   . Attends Religious Services:   . Active Member of Clubs or Organizations:   . Attends Archivist Meetings:   Marland Kitchen Marital Status:   Intimate Partner Violence:   . Fear of Current or Ex-Partner:   . Emotionally Abused:   Marland Kitchen Physically Abused:   . Sexually Abused:     Outpatient Medications Prior to Visit  Medication Sig Dispense Refill  . acetaminophen (TYLENOL) 325 MG tablet Take 650 mg by mouth.    . ACETAMINOPHEN-BUTALBITAL 50-325 MG TABS Take 1 tablet by mouth 2 (two) times daily as needed. 120 tablet 1  . albuterol (PROVENTIL HFA;VENTOLIN HFA) 108 (90 Base) MCG/ACT inhaler Inhale 2 puffs into the lungs every 6 (six) hours as needed for wheezing or shortness of breath. 1 Inhaler 2  . ALPRAZolam (XANAX) 0.25 MG tablet Take 1-2 tabs (0.'25mg'$ -0.'50mg'$ ) 30-60 minutes before procedure. May repeat if needed.Do not drive. 4 tablet 0  . amLODipine (NORVASC) 10 MG tablet Take 1 tablet (10 mg total) by mouth 2 (two) times daily. 180 tablet 1  . aspirin EC 81 MG tablet Take 1 tablet (81 mg total) by mouth daily. 90 tablet 1  . Carboxymethylcellulose Sodium (ARTIFICIAL TEARS OP) Apply to eye.    . carvedilol (COREG) 25 MG tablet Take 1 tablet (25 mg total) by mouth 2 (two) times daily with a meal. 180 tablet 1  . cetirizine (ZYRTEC) 10 MG tablet Take 10 mg by mouth daily.    . Cholecalciferol (VITAMIN D-3) 25 MCG (1000 UT) CAPS Take by mouth.    . conjugated estrogens (PREMARIN) vaginal cream Place vaginally.    . cyclobenzaprine (FLEXERIL) 10 MG tablet Take 10 mg by mouth as needed.     . cyclobenzaprine (FLEXERIL) 10 MG  tablet Take 1 tablet (10 mg total) by mouth at bedtime. 7 tablet 0  . doxycycline (VIBRAMYCIN) 100 MG capsule Take 100 mg by mouth 2 (two) times daily.    . famciclovir (FAMVIR) 250 MG tablet Take 1 tablet (250 mg total) by mouth daily. 30 tablet 12  . famotidine (PEPCID) 40 MG/5ML suspension Take 5 mLs (40 mg total) by mouth at  bedtime. 150 mL 5  . fluticasone (FLONASE) 50 MCG/ACT nasal spray Place 2 sprays into both nostrils daily. (Patient taking differently: Place 2 sprays into both nostrils as needed. ) 16 g 1  . fluticasone (FLONASE) 50 MCG/ACT nasal spray Place 2 sprays into both nostrils daily. 16 g 0  . furosemide (LASIX) 20 MG tablet Take 1 tablet (20 mg total) by mouth daily as needed for fluid or edema. 30 tablet 2  . Galcanezumab-gnlm (EMGALITY) 120 MG/ML SOAJ Inject 120 mg into the skin every 30 (thirty) days. 1 pen 11  . HYDROcodone-acetaminophen (NORCO/VICODIN) 5-325 MG tablet Take 1 tablet by mouth every 4 (four) hours as needed for moderate pain or severe pain. 20 tablet 0  . hyoscyamine (LEVSIN SL) 0.125 MG SL tablet Place 1 tablet (0.125 mg total) under the tongue every 4 (four) hours as needed. 30 tablet 2  . lidocaine (LIDODERM) 5 % Place 1 patch onto the skin daily. Remove & Discard patch within 12 hours or as directed by MD 30 patch 2  . loratadine (CLARITIN) 10 MG tablet Take 1 tablet (10 mg total) by mouth 2 (two) times daily as needed for allergies. 30 tablet 11  . LORazepam (ATIVAN) 0.5 MG tablet Take 1 tablet (0.5 mg total) by mouth every 8 (eight) hours. 30 tablet 0  . meclizine (ANTIVERT) 12.5 MG tablet Take 12.5 mg by mouth 3 (three) times daily.    . Melatonin 3 MG CAPS Take by mouth.    . metoCLOPramide (REGLAN) 10 MG tablet Take 1 tablet (10 mg total) by mouth 4 (four) times daily. For migraine or headache or nausea 30 tablet 6  . montelukast (SINGULAIR) 10 MG tablet Take 1 tablet (10 mg total) by mouth at bedtime. Start taking Singulair on 11/16/2019. 20 tablet 0    . Multiple Vitamins-Minerals (MULTIVITAMIN ADULT PO) Take by mouth.    . nitrofurantoin, macrocrystal-monohydrate, (MACROBID) 100 MG capsule Take 1 capsule (100 mg total) by mouth 2 (two) times daily. 9 capsule 0  . ondansetron (ZOFRAN) 4 MG tablet Take 1 tablet (4 mg total) by mouth every 8 (eight) hours as needed for nausea or vomiting. 30 tablet 1  . pantoprazole (PROTONIX) 40 MG tablet Take 1 tablet (40 mg total) by mouth 2 (two) times daily. 180 tablet 1  . pomalidomide (POMALYST) 2 MG capsule Take 1 capsule (2 mg total) by mouth daily. Every 21 days. Auth # [id number] 21 capsule 0  . prochlorperazine (COMPAZINE) 10 MG tablet Take 1 tablet (10 mg total) by mouth every 6 (six) hours as needed (Nausea or vomiting). 30 tablet 1  . promethazine (PHENERGAN) 25 MG tablet Take 1 tablet (25 mg total) by mouth every 8 (eight) hours as needed for nausea or vomiting. 30 tablet 2  . Rimegepant Sulfate (NURTEC) 75 MG TBDP Take 75 mg by mouth daily as needed. For migraines. Take as close to onset of migraine as possible. One daily maximum. 10 tablet 6  . sucralfate (CARAFATE) 1 g tablet Take 1 tablet (1 g total) by mouth 4 (four) times daily -  with meals and at bedtime. 120 tablet 0  . SUMAtriptan (IMITREX) 100 MG tablet 1 tab PO at headache onset, may repeat in 2 hrs as needed. No more than 2 tabs per day or 4 tabs per wk. (Can dispense #9 if quality limit)    . topiramate (TOPAMAX) 50 MG tablet Take 1 tablet (50 mg total) by mouth 2 (two) times daily. 60 tablet 3  .  triamterene-hydrochlorothiazide (MAXZIDE-25) 37.5-25 MG tablet Take 1 tablet by mouth daily.    . potassium chloride SA (K-DUR) 20 MEQ tablet Take 2 tablets (40 mEq total) by mouth once for 1 dose. 60 tablet 3   No facility-administered medications prior to visit.    Allergies  Allergen Reactions  . Benazepril Anaphylaxis and Swelling    angioedema Throat and lip swelling  . Ondansetron Hcl Hives    Redness and hives post IV admin on  07/05/17  . Codeine Nausea And Vomiting  . Morphine Hives    Redness and hives noted post IV admin on 07/05/17    Review of Systems  Constitutional: Positive for malaise/fatigue. Negative for fever.  HENT: Positive for congestion.   Eyes: Negative for blurred vision.  Respiratory: Negative for shortness of breath.   Cardiovascular: Negative for chest pain, palpitations and leg swelling.  Gastrointestinal: Negative for abdominal pain, blood in stool and nausea.  Genitourinary: Negative for dysuria and frequency.  Musculoskeletal: Positive for back pain, joint pain, myalgias and neck pain. Negative for falls.  Skin: Negative for rash.  Neurological: Positive for headaches. Negative for dizziness and loss of consciousness.  Endo/Heme/Allergies: Negative for environmental allergies.  Psychiatric/Behavioral: Positive for depression. The patient is not nervous/anxious.        Objective:    Physical Exam unable to obtain via phone visit  BP (!) 154/91   Pulse 79  Wt Readings from Last 3 Encounters:  02/19/20 197 lb 6.4 oz (89.5 kg)  02/02/20 195 lb (88.5 kg)  01/18/20 194 lb 14.4 oz (88.4 kg)    Diabetic Foot Exam - Simple   No data filed     Lab Results  Component Value Date   WBC 3.7 (L) 02/02/2020   HGB 12.1 02/02/2020   HCT 37.3 02/02/2020   PLT 161 02/02/2020   GLUCOSE 118 (H) 02/15/2020   CHOL 193 01/16/2020   TRIG 120.0 01/16/2020   HDL 52.90 01/16/2020   LDLCALC 116 (H) 01/16/2020   ALT 14 02/15/2020   AST 23 02/15/2020   NA 139 02/15/2020   K 4.4 hemolyzed sample 02/15/2020   CL 105 02/15/2020   CREATININE 0.73 02/15/2020   BUN 16 02/15/2020   CO2 26 02/15/2020   TSH 1.29 01/16/2020   INR 0.90 07/28/2018   HGBA1C 5.9 01/16/2020    Lab Results  Component Value Date   TSH 1.29 01/16/2020   Lab Results  Component Value Date   WBC 3.7 (L) 02/02/2020   HGB 12.1 02/02/2020   HCT 37.3 02/02/2020   MCV 90.1 02/02/2020   PLT 161 02/02/2020   Lab  Results  Component Value Date   NA 139 02/15/2020   K 4.4 hemolyzed sample 02/15/2020   CHLORIDE 108 12/31/2016   CO2 26 02/15/2020   GLUCOSE 118 (H) 02/15/2020   BUN 16 02/15/2020   CREATININE 0.73 02/15/2020   BILITOT 0.5 02/15/2020   ALKPHOS 48 02/15/2020   AST 23 02/15/2020   ALT 14 02/15/2020   PROT 7.3 02/15/2020   ALBUMIN 3.8 02/15/2020   CALCIUM 8.9 02/15/2020   ANIONGAP 8 02/02/2020   EGFR >90 12/31/2016   GFR 96.60 02/15/2020   Lab Results  Component Value Date   CHOL 193 01/16/2020   Lab Results  Component Value Date   HDL 52.90 01/16/2020   Lab Results  Component Value Date   LDLCALC 116 (H) 01/16/2020   Lab Results  Component Value Date   TRIG 120.0 01/16/2020   Lab  Results  Component Value Date   CHOLHDL 4 01/16/2020   Lab Results  Component Value Date   HGBA1C 5.9 01/16/2020       Assessment & Plan:   Problem List Items Addressed This Visit    Hyperlipidemia   Hypertension    Elevated secondary to pain, she will monitor and report if worsensno changes to meds. Encouraged heart healthy diet such as the DASH diet and exercise as tolerated.       Relevant Orders   CBC with Differential/Platelet   Sedimentation rate   Comprehensive metabolic panel   Elevated sed rate    Repeat Sed rate today      Thoracic back pain - Primary    Encouraged moist heat and gentle stretching as tolerated. May try NSAIDs and prescription meds as directed and report if symptoms worsen or seek immediate care. Xray ordered she notes she got some relief from Toradol in ER so is allowed Naproxen 375 mg po bid prn to take as needed with food.       Relevant Medications   Naproxen 375 MG TBEC   Other Relevant Orders   DG Thoracic Spine 2 View   CBC with Differential/Platelet   Sedimentation rate   Comprehensive metabolic panel   Hyperglycemia    hgba1c acceptable, minimize simple carbs. Increase exercise as tolerated.          I am having Gershon Mussel  start on Naproxen. I am also having her maintain her conjugated estrogens, acetaminophen, pantoprazole, aspirin EC, ondansetron, lidocaine, sucralfate, famciclovir, hyoscyamine, albuterol, cyclobenzaprine, Multiple Vitamins-Minerals (MULTIVITAMIN ADULT PO), HYDROcodone-acetaminophen, carvedilol, topiramate, fluticasone, potassium chloride SA, ACETAMINOPHEN-BUTALBITAL, furosemide, Carboxymethylcellulose Sodium (ARTIFICIAL TEARS OP), metoCLOPramide, ALPRAZolam, loratadine, promethazine, triamterene-hydrochlorothiazide, SUMAtriptan, Emgality, Nurtec, montelukast, pomalidomide, Vitamin D-3, Melatonin, prochlorperazine, LORazepam, famotidine, fluticasone, amLODipine, nitrofurantoin (macrocrystal-monohydrate), cetirizine, cyclobenzaprine, doxycycline, and meclizine.  Meds ordered this encounter  Medications  . Naproxen 375 MG TBEC    Sig: Take 1 tablet (375 mg total) by mouth every morning.    Dispense:  30 tablet    Refill:  2     I discussed the assessment and treatment plan with the patient. The patient was provided an opportunity to ask questions and all were answered. The patient agreed with the plan and demonstrated an understanding of the instructions.   The patient was advised to call back or seek an in-person evaluation if the symptoms worsen or if the condition fails to improve as anticipated.  I provided 25 minutes of non-face-to-face time during this encounter.   Penni Homans, MD

## 2020-02-27 ENCOUNTER — Other Ambulatory Visit: Payer: Self-pay

## 2020-02-27 ENCOUNTER — Ambulatory Visit (HOSPITAL_BASED_OUTPATIENT_CLINIC_OR_DEPARTMENT_OTHER)
Admission: RE | Admit: 2020-02-27 | Discharge: 2020-02-27 | Disposition: A | Discharge status: Home / Self Care | Payer: Medicare Other | Source: Ambulatory Visit | Attending: Family Medicine | Admitting: Family Medicine

## 2020-02-27 ENCOUNTER — Other Ambulatory Visit (INDEPENDENT_AMBULATORY_CARE_PROVIDER_SITE_OTHER): Payer: Medicare Other

## 2020-02-27 DIAGNOSIS — I10 Essential (primary) hypertension: Secondary | ICD-10-CM

## 2020-02-27 DIAGNOSIS — M546 Pain in thoracic spine: Secondary | ICD-10-CM

## 2020-02-28 DIAGNOSIS — I10 Essential (primary) hypertension: Secondary | ICD-10-CM | POA: Diagnosis not present

## 2020-02-28 DIAGNOSIS — G4733 Obstructive sleep apnea (adult) (pediatric): Secondary | ICD-10-CM | POA: Diagnosis not present

## 2020-02-28 DIAGNOSIS — I420 Dilated cardiomyopathy: Secondary | ICD-10-CM | POA: Diagnosis not present

## 2020-02-28 DIAGNOSIS — I447 Left bundle-branch block, unspecified: Secondary | ICD-10-CM | POA: Diagnosis not present

## 2020-02-28 DIAGNOSIS — Z8673 Personal history of transient ischemic attack (TIA), and cerebral infarction without residual deficits: Secondary | ICD-10-CM | POA: Diagnosis not present

## 2020-02-28 LAB — CBC WITH DIFFERENTIAL/PLATELET
Basophils Absolute: 0 10*3/uL (ref 0.0–0.1)
Basophils Relative: 0.4 % (ref 0.0–3.0)
Eosinophils Absolute: 0.1 10*3/uL (ref 0.0–0.7)
Eosinophils Relative: 2.3 % (ref 0.0–5.0)
HCT: 38.4 % (ref 36.0–46.0)
Hemoglobin: 12.7 g/dL (ref 12.0–15.0)
Lymphocytes Relative: 22.8 % (ref 12.0–46.0)
Lymphs Abs: 1 10*3/uL (ref 0.7–4.0)
MCHC: 33 g/dL (ref 30.0–36.0)
MCV: 90.3 fl (ref 78.0–100.0)
Monocytes Absolute: 0.4 10*3/uL (ref 0.1–1.0)
Monocytes Relative: 9.8 % (ref 3.0–12.0)
Neutro Abs: 2.8 10*3/uL (ref 1.4–7.7)
Neutrophils Relative %: 64.7 % (ref 43.0–77.0)
Platelets: 143 10*3/uL — ABNORMAL LOW (ref 150.0–400.0)
RBC: 4.25 Mil/uL (ref 3.87–5.11)
RDW: 14.6 % (ref 11.5–15.5)
WBC: 4.4 10*3/uL (ref 4.0–10.5)

## 2020-02-28 LAB — COMPREHENSIVE METABOLIC PANEL
ALT: 12 U/L (ref 0–35)
AST: 18 U/L (ref 0–37)
Albumin: 3.8 g/dL (ref 3.5–5.2)
Alkaline Phosphatase: 52 U/L (ref 39–117)
BUN: 16 mg/dL (ref 6–23)
CO2: 27 mEq/L (ref 19–32)
Calcium: 9.3 mg/dL (ref 8.4–10.5)
Chloride: 105 mEq/L (ref 96–112)
Creatinine, Ser: 0.68 mg/dL (ref 0.40–1.20)
GFR: 104.83 mL/min (ref >60.00)
Glucose, Bld: 103 mg/dL — ABNORMAL HIGH (ref 70–99)
Potassium: 3.5 mEq/L (ref 3.5–5.1)
Sodium: 140 mEq/L (ref 135–145)
Total Bilirubin: 0.4 mg/dL (ref 0.2–1.2)
Total Protein: 7.4 g/dL (ref 6.0–8.3)

## 2020-02-28 LAB — SEDIMENTATION RATE: Sed Rate: 130 mm/hr — ABNORMAL HIGH (ref 0–30)

## 2020-02-29 ENCOUNTER — Other Ambulatory Visit: Payer: Self-pay

## 2020-02-29 ENCOUNTER — Telehealth: Payer: Self-pay | Admitting: *Deleted

## 2020-02-29 ENCOUNTER — Other Ambulatory Visit: Payer: Self-pay | Admitting: *Deleted

## 2020-02-29 ENCOUNTER — Telehealth: Payer: Self-pay | Admitting: Family Medicine

## 2020-02-29 ENCOUNTER — Telehealth: Payer: Self-pay | Admitting: Hematology & Oncology

## 2020-02-29 DIAGNOSIS — I447 Left bundle-branch block, unspecified: Secondary | ICD-10-CM | POA: Diagnosis not present

## 2020-02-29 MED ORDER — MAGNESIUM OXIDE 400 MG PO TABS: 400.0000 mg | ORAL_TABLET | Freq: Every day | ORAL | 0 refills | Status: DC

## 2020-02-29 MED FILL — MAGNESIUM OXIDE 400 MG TAB: 400 (240 MG | 120 days supply | Qty: 120 | Fill #0

## 2020-02-29 NOTE — Telephone Encounter (Signed)
Patient came into office stating that she had blood work done at her cardiologist office and they told her that her magnesium level was 1.7.  They advised her that if she was feeling bad that she should go to ED.  Per Dr. Charlett Blake magnesium level looked ok, but I advised her that patient had a history of low magnesium in the past and was a little worried.  Per Dr. Charlett Blake send in Mag Ox 400mg  1 qd #30 and recheck in 1 week.  Advised patient about new med and to recheck in 1 week.  Med sent in.

## 2020-02-29 NOTE — Telephone Encounter (Signed)
Appointments rescheduled from 5/28 Provider PAL letter/calendar mailed

## 2020-03-01 ENCOUNTER — Other Ambulatory Visit: Payer: Self-pay | Admitting: *Deleted

## 2020-03-01 DIAGNOSIS — R790 Abnormal level of blood mineral: Secondary | ICD-10-CM

## 2020-03-01 NOTE — Telephone Encounter (Signed)
Patient notified and appointment made

## 2020-03-02 DIAGNOSIS — R519 Headache, unspecified: Secondary | ICD-10-CM | POA: Diagnosis not present

## 2020-03-02 DIAGNOSIS — R9431 Abnormal electrocardiogram [ECG] [EKG]: Secondary | ICD-10-CM | POA: Diagnosis not present

## 2020-03-02 DIAGNOSIS — I447 Left bundle-branch block, unspecified: Secondary | ICD-10-CM | POA: Diagnosis not present

## 2020-03-02 DIAGNOSIS — D72819 Decreased white blood cell count, unspecified: Secondary | ICD-10-CM | POA: Diagnosis not present

## 2020-03-02 DIAGNOSIS — R700 Elevated erythrocyte sedimentation rate: Secondary | ICD-10-CM | POA: Diagnosis not present

## 2020-03-02 DIAGNOSIS — H538 Other visual disturbances: Secondary | ICD-10-CM | POA: Diagnosis not present

## 2020-03-03 DIAGNOSIS — I447 Left bundle-branch block, unspecified: Secondary | ICD-10-CM | POA: Diagnosis not present

## 2020-03-04 ENCOUNTER — Ambulatory Visit (INDEPENDENT_AMBULATORY_CARE_PROVIDER_SITE_OTHER): Payer: Medicare Other | Admitting: Family Medicine

## 2020-03-04 ENCOUNTER — Ambulatory Visit (HOSPITAL_BASED_OUTPATIENT_CLINIC_OR_DEPARTMENT_OTHER)
Admission: RE | Admit: 2020-03-04 | Discharge: 2020-03-04 | Disposition: A | Discharge status: Home / Self Care | Payer: Medicare Other | Source: Ambulatory Visit | Attending: Family Medicine | Admitting: Family Medicine

## 2020-03-04 ENCOUNTER — Other Ambulatory Visit: Payer: Self-pay

## 2020-03-04 VITALS — BP 116/66 | HR 93 | Temp 97.7°F | Resp 12 | Ht 66.0 in | Wt 198.8 lb

## 2020-03-04 DIAGNOSIS — I10 Essential (primary) hypertension: Secondary | ICD-10-CM

## 2020-03-04 DIAGNOSIS — R109 Unspecified abdominal pain: Secondary | ICD-10-CM

## 2020-03-04 DIAGNOSIS — R790 Abnormal level of blood mineral: Secondary | ICD-10-CM

## 2020-03-04 DIAGNOSIS — D696 Thrombocytopenia, unspecified: Secondary | ICD-10-CM | POA: Diagnosis not present

## 2020-03-04 DIAGNOSIS — R1310 Dysphagia, unspecified: Secondary | ICD-10-CM | POA: Insufficient documentation

## 2020-03-04 DIAGNOSIS — M546 Pain in thoracic spine: Secondary | ICD-10-CM | POA: Insufficient documentation

## 2020-03-04 DIAGNOSIS — R739 Hyperglycemia, unspecified: Secondary | ICD-10-CM

## 2020-03-04 DIAGNOSIS — K5900 Constipation, unspecified: Secondary | ICD-10-CM | POA: Diagnosis not present

## 2020-03-04 DIAGNOSIS — R519 Headache, unspecified: Secondary | ICD-10-CM | POA: Diagnosis not present

## 2020-03-04 MED ORDER — MAGNESIUM OXIDE 400 MG PO TABS: 400.0000 mg | ORAL_TABLET | Freq: Two times a day (BID) | ORAL | 1 refills | Status: DC

## 2020-03-04 NOTE — Assessment & Plan Note (Signed)
Increase Magnesium to 400 mg po bid and recheck level

## 2020-03-04 NOTE — Progress Notes (Signed)
Subjective:    [name], female    DOB: 12/12/1955, 64 y.o.   MRN: [id number]  Chief Complaint  Patient presents with  . Follow-up    HPI Patient is in today for follow up on recent hospital visit. She presented to ER with headache, nausea and general malaise. She continues to have daily headaches. She saw her cardiologist Dr Ola Spurr last week and they confirmed that the pacer/defibrillator she has is compatable with MRI. She has yet to make the appointment with neurology that she was referred to back in February, [facility] called her 3 x and she never answered. She notes the majority of her pain is on the top of her head and occurs daily. She also has persistent nausea, reflux and now notes trouble with dysphagia recently and some constipation, moving only small amounts of hard stool daily. She also notes some back pain and malaise. Denies CP/palp/SOB/congestion/fevers or GU c/o. Taking meds as prescribed  Past Medical History:  Diagnosis Date  . Abnormal SPEP 07/26/2016  . Arthritis    knees, hands  . Back pain 07/14/2016  . Bowel obstruction ([place]) 11/2014  . C. difficile diarrhea   . CHF (congestive heart failure) ([place])   . Colitis   . Congestive heart failure ([place]) 02/24/2015   S/p pacemaker  . Depression   . Elevated sed rate 08/15/2017  . Epigastric pain 03/22/2017  . GERD (gastroesophageal reflux disease)   . Heart disease   . Hyperlipidemia   . Hypertension   . 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 Low back pain 07/14/2016  . 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 Multiple myeloma ([place]) 07/20/2018  . Multiple myeloma not having achieved remission ([place]) 07/20/2018  . Myalgia 12/31/2016  . Obstructive sleep apnea 03/31/2015  . SOB (shortness of breath) 04/09/2016  . Stroke ([place])    TIAs  . UTI (urinary tract infection)     Past Surgical History:  Procedure Laterality Date  . ABDOMINAL HYSTERECTOMY     menorraghia, 2006, total  .  CHOLECYSTECTOMY    . COLONOSCOPY  2018   [facility] per patient  . ESOPHAGOGASTRODUODENOSCOPY  2018   [facility]   . KNEE SURGERY     right, repair torn torn cartialage  . PACEMAKER INSERTION  08/2014  . TONSILLECTOMY    . TUBAL LIGATION      Family History  Problem Relation Age of Onset  . Diabetes Mother   . Hypertension Mother   . Heart disease Mother        s/p 1 stent  . Hyperlipidemia Mother   . Arthritis Mother   . Cancer Father        COLON  . Colon cancer Father 37  . Irritable bowel syndrome Sister   . Hyperlipidemia Daughter   . Hypertension Daughter   . Hypertension Maternal Grandmother   . Arthritis Maternal Grandmother   . Heart disease Maternal Grandfather        MI  . Hypertension Maternal Grandfather   . Arthritis Maternal Grandfather   . Hypertension Son   . Esophageal cancer Neg Hx     Social History   Socioeconomic History  . Marital status: Divorced    Spouse name: [name]  . Number of children: 3  . Years of education: [name]  . Highest education level: High school graduate  Occupational History  . [name]  Tobacco Use  . Smoking status: Never Smoker  .  Smokeless tobacco: Never Used  Substance and Sexual Activity  . Alcohol use: No  . Drug use: No  . Sexual activity: [name]  Other Topics Concern  . [name]  Social History Narrative   Lives at home alone   Retired   Caffeine: coffee   Social Determinants of Health   Financial Resource Strain:   . Difficulty of Paying Living Expenses:   Food Insecurity:   . Worried About Charity fundraiser in the Last Year:   . Arboriculturist in the Last Year:   Transportation Needs:   . Film/video editor (Medical):   Marland Kitchen Lack of Transportation (Non-Medical):   Physical Activity:   . Days of Exercise per Week:   . Minutes of Exercise per Session:   Stress:   . Feeling of Stress :   Social Connections:   . Frequency of Communication with  Friends and Family:   . Frequency of Social Gatherings with Friends and Family:   . Attends Religious Services:   . Active Member of Clubs or Organizations:   . Attends Archivist Meetings:   Marland Kitchen Marital Status:   Intimate Partner Violence:   . Fear of Current or Ex-Partner:   . Emotionally Abused:   Marland Kitchen Physically Abused:   . Sexually Abused:     Outpatient Medications Prior to Visit  Medication Sig Dispense Refill  . acetaminophen (TYLENOL) 325 MG tablet Take 650 mg by mouth.    . ACETAMINOPHEN-BUTALBITAL 50-325 MG TABS Take 1 tablet by mouth 2 (two) times daily as needed. 120 tablet 1  . albuterol (PROVENTIL HFA;VENTOLIN HFA) 108 (90 Base) MCG/ACT inhaler Inhale 2 puffs into the lungs every 6 (six) hours as needed for wheezing or shortness of breath. 1 Inhaler 2  . amLODipine (NORVASC) 10 MG tablet Take 1 tablet (10 mg total) by mouth 2 (two) times daily. 180 tablet 1  . aspirin EC 81 MG tablet Take 1 tablet (81 mg total) by mouth daily. 90 tablet 1  . carvedilol (COREG) 25 MG tablet Take 1 tablet (25 mg total) by mouth 2 (two) times daily with a meal. 180 tablet 1  . cetirizine (ZYRTEC) 10 MG tablet Take 10 mg by mouth daily.    . Cholecalciferol (VITAMIN D-3) 25 MCG (1000 UT) CAPS Take by mouth.    . conjugated estrogens (PREMARIN) vaginal cream Place vaginally.    . cyclobenzaprine (FLEXERIL) 10 MG tablet Take 1 tablet (10 mg total) by mouth at bedtime. 7 tablet 0  . fluticasone (FLONASE) 50 MCG/ACT nasal spray Place 2 sprays into both nostrils daily. 16 g 0  . furosemide (LASIX) 20 MG tablet Take 1 tablet (20 mg total) by mouth daily as needed for fluid or edema. 30 tablet 2  . meclizine (ANTIVERT) 12.5 MG tablet Take 12.5 mg by mouth 3 (three) times daily.    . metoCLOPramide (REGLAN) 10 MG tablet Take 1 tablet (10 mg total) by mouth 4 (four) times daily. For migraine or headache or nausea 30 tablet 6  . montelukast (SINGULAIR) 10 MG tablet Take 1 tablet (10 mg total) by  mouth at bedtime. Start taking Singulair on 11/16/2019. 20 tablet 0  . Multiple Vitamins-Minerals (MULTIVITAMIN ADULT PO) Take by mouth.    . Naproxen 375 MG TBEC Take 1 tablet (375 mg total) by mouth every morning. 30 tablet 2  . ondansetron (ZOFRAN) 4 MG tablet Take 1 tablet (4 mg total) by mouth every 8 (eight)  hours as needed for nausea or vomiting. 30 tablet 1  . pantoprazole (PROTONIX) 40 MG tablet Take 1 tablet (40 mg total) by mouth 2 (two) times daily. 180 tablet 1  . prochlorperazine (COMPAZINE) 10 MG tablet Take 1 tablet (10 mg total) by mouth every 6 (six) hours as needed (Nausea or vomiting). 30 tablet 1  . promethazine (PHENERGAN) 25 MG tablet Take 1 tablet (25 mg total) by mouth every 8 (eight) hours as needed for nausea or vomiting. 30 tablet 2  . Rimegepant Sulfate (NURTEC) 75 MG TBDP Take 75 mg by mouth daily as needed. For migraines. Take as close to onset of migraine as possible. One daily maximum. 10 tablet 6  . topiramate (TOPAMAX) 50 MG tablet Take 1 tablet (50 mg total) by mouth 2 (two) times daily. 60 tablet 3  . triamterene-hydrochlorothiazide (MAXZIDE-25) 37.5-25 MG tablet Take 1 tablet by mouth daily.    . magnesium oxide (MAG-OX) 400 MG tablet Take 1 tablet (400 mg total) by mouth daily. 30 tablet 0  . potassium chloride SA (K-DUR) 20 MEQ tablet Take 2 tablets (40 mEq total) by mouth once for 1 dose. 60 tablet 3  . ALPRAZolam (XANAX) 0.25 MG tablet Take 1-2 tabs (0.'25mg'$ -0.'50mg'$ ) 30-60 minutes before procedure. May repeat if needed.Do not drive. 4 tablet 0  . Carboxymethylcellulose Sodium (ARTIFICIAL TEARS OP) Apply to eye.    . cyclobenzaprine (FLEXERIL) 10 MG tablet Take 10 mg by mouth as needed.     . doxycycline (VIBRAMYCIN) 100 MG capsule Take 100 mg by mouth 2 (two) times daily.    . famciclovir (FAMVIR) 250 MG tablet Take 1 tablet (250 mg total) by mouth daily. 30 tablet 12  . famotidine (PEPCID) 40 MG/5ML suspension Take 5 mLs (40 mg total) by mouth at bedtime. 150  mL 5  . fluticasone (FLONASE) 50 MCG/ACT nasal spray Place 2 sprays into both nostrils daily. (Patient taking differently: Place 2 sprays into both nostrils as needed. ) 16 g 1  . Galcanezumab-gnlm (EMGALITY) 120 MG/ML SOAJ Inject 120 mg into the skin every 30 (thirty) days. 1 pen 11  . HYDROcodone-acetaminophen (NORCO/VICODIN) 5-325 MG tablet Take 1 tablet by mouth every 4 (four) hours as needed for moderate pain or severe pain. 20 tablet 0  . hyoscyamine (LEVSIN SL) 0.125 MG SL tablet Place 1 tablet (0.125 mg total) under the tongue every 4 (four) hours as needed. 30 tablet 2  . lidocaine (LIDODERM) 5 % Place 1 patch onto the skin daily. Remove & Discard patch within 12 hours or as directed by MD 30 patch 2  . loratadine (CLARITIN) 10 MG tablet Take 1 tablet (10 mg total) by mouth 2 (two) times daily as needed for allergies. 30 tablet 11  . LORazepam (ATIVAN) 0.5 MG tablet Take 1 tablet (0.5 mg total) by mouth every 8 (eight) hours. 30 tablet 0  . Melatonin 3 MG CAPS Take by mouth.    . nitrofurantoin, macrocrystal-monohydrate, (MACROBID) 100 MG capsule Take 1 capsule (100 mg total) by mouth 2 (two) times daily. 9 capsule 0  . pomalidomide (POMALYST) 2 MG capsule Take 1 capsule (2 mg total) by mouth daily. Every 21 days. Auth # [id number] 21 capsule 0  . sucralfate (CARAFATE) 1 g tablet Take 1 tablet (1 g total) by mouth 4 (four) times daily -  with meals and at bedtime. 120 tablet 0  . SUMAtriptan (IMITREX) 100 MG tablet 1 tab PO at headache onset, may repeat in 2 hrs as needed.  No more than 2 tabs per day or 4 tabs per wk. (Can dispense #9 if quality limit)     No facility-administered medications prior to visit.    Allergies  Allergen Reactions  . Benazepril Anaphylaxis and Swelling    angioedema Throat and lip swelling  . Ondansetron Hcl Hives    Redness and hives post IV admin on 07/05/17  . Codeine Nausea And Vomiting  . Morphine Hives    Redness and hives noted post IV admin on  07/05/17    Review of Systems  Constitutional: Positive for malaise/fatigue. Negative for fever.  HENT: Negative for congestion.   Eyes: Negative for blurred vision.  Respiratory: Negative for shortness of breath.   Cardiovascular: Negative for chest pain, palpitations and leg swelling.  Gastrointestinal: Positive for constipation, heartburn, nausea and vomiting. Negative for abdominal pain, blood in stool, diarrhea and melena.  Genitourinary: Negative for dysuria and frequency.  Musculoskeletal: Positive for back pain. Negative for falls.  Skin: Negative for rash.  Neurological: Positive for headaches. Negative for dizziness and loss of consciousness.  Endo/Heme/Allergies: Negative for environmental allergies.  Psychiatric/Behavioral: Negative for depression. The patient is nervous/anxious.        Objective:    Physical Exam Vitals and nursing note reviewed.  Constitutional:      General: She is not in acute distress.    Appearance: She is well-developed.  HENT:     Head: Normocephalic and atraumatic.     Nose: Nose normal.  Eyes:     General:        Right eye: No discharge.        Left eye: No discharge.  Cardiovascular:     Rate and Rhythm: Normal rate and regular rhythm.     Heart sounds: No murmur.  Pulmonary:     Effort: Pulmonary effort is normal.     Breath sounds: Normal breath sounds.  Abdominal:     General: Bowel sounds are normal.     Palpations: Abdomen is soft.     Tenderness: There is no abdominal tenderness.  Musculoskeletal:     Cervical back: Normal range of motion and neck supple.  Skin:    General: Skin is warm and dry.  Neurological:     Mental Status: She is alert and oriented to person, place, and time.     BP 116/66 (BP Location: Right Arm, Cuff Size: Large)   Pulse 93   Temp 97.7 F (36.5 C) (Temporal)   Resp 12   Ht '5\' 6"'$  (1.676 m)   Wt 198 lb 12.8 oz (90.2 kg)   SpO2 97%   BMI 32.09 kg/m  Wt Readings from Last 3 Encounters:    03/04/20 198 lb 12.8 oz (90.2 kg)  02/19/20 197 lb 6.4 oz (89.5 kg)  02/02/20 195 lb (88.5 kg)    Diabetic Foot Exam - Simple   No data filed     Lab Results  Component Value Date   WBC 4.4 02/27/2020   HGB 12.7 02/27/2020   HCT 38.4 02/27/2020   PLT 143.0 (L) 02/27/2020   GLUCOSE 103 (H) 02/27/2020   CHOL 193 01/16/2020   TRIG 120.0 01/16/2020   HDL 52.90 01/16/2020   LDLCALC 116 (H) 01/16/2020   ALT 12 02/27/2020   AST 18 02/27/2020   NA 140 02/27/2020   K 3.5 02/27/2020   CL 105 02/27/2020   CREATININE 0.68 02/27/2020   BUN 16 02/27/2020   CO2 27 02/27/2020   TSH 1.29 01/16/2020  INR 0.90 07/28/2018   HGBA1C 5.9 01/16/2020    Lab Results  Component Value Date   TSH 1.29 01/16/2020   Lab Results  Component Value Date   WBC 4.4 02/27/2020   HGB 12.7 02/27/2020   HCT 38.4 02/27/2020   MCV 90.3 02/27/2020   PLT 143.0 (L) 02/27/2020   Lab Results  Component Value Date   NA 140 02/27/2020   K 3.5 02/27/2020   CHLORIDE 108 12/31/2016   CO2 27 02/27/2020   GLUCOSE 103 (H) 02/27/2020   BUN 16 02/27/2020   CREATININE 0.68 02/27/2020   BILITOT 0.4 02/27/2020   ALKPHOS 52 02/27/2020   AST 18 02/27/2020   ALT 12 02/27/2020   PROT 7.4 02/27/2020   ALBUMIN 3.8 02/27/2020   CALCIUM 9.3 02/27/2020   ANIONGAP 8 02/02/2020   EGFR >90 12/31/2016   GFR 104.83 02/27/2020   Lab Results  Component Value Date   CHOL 193 01/16/2020   Lab Results  Component Value Date   HDL 52.90 01/16/2020   Lab Results  Component Value Date   LDLCALC 116 (H) 01/16/2020   Lab Results  Component Value Date   TRIG 120.0 01/16/2020   Lab Results  Component Value Date   CHOLHDL 4 01/16/2020   Lab Results  Component Value Date   HGBA1C 5.9 01/16/2020       Assessment & Plan:   Problem List Items Addressed This Visit    Hypertension    Well controlled, no changes to meds. Encouraged heart healthy diet such as the DASH diet and exercise as tolerated.        Relevant Orders   Comprehensive metabolic panel   AP (abdominal pain)   Relevant Orders   DG Abd 2 Views   Ambulatory referral to Gastroenterology   Thoracic back pain   Relevant Orders   DG Thoracic Spine 2 View   Headache    Persistent and daily she was referred to [facility] neurology in February but they tried to call her 3 x and they never got an answer so they closed the referral. Today she is given the phone number for [facility] neurology and she agrees to call them and make an appt.       Relevant Orders   Sedimentation rate   Hyperglycemia    hgba1c acceptable, minimize simple carbs. Increase exercise as tolerated.      Low magnesium level - Primary    Increase Magnesium to 400 mg po bid and recheck level      Relevant Orders   Magnesium   Thrombocytopenia (HCC)    Asymptomatic repeat cbc today      Relevant Orders   CBC with Differential/Platelet   Dysphagia    She notes recently she has noted some trouble with food catching in her throat. Referred to gastroenterology for further evaluation      Relevant Orders   DG Abd 2 Views   Ambulatory referral to Gastroenterology   Constipation    Moves her bowels daily but only small, hard amounts now. No bloody or tarry stool. Encouraged increased hydration and fiber in diet. Daily probiotics. If bowels not moving can use MOM 2 tbls po in 4 oz of warm prune juice by mouth every 2-3 days. If no results then repeat in 4 hours with  Dulcolax suppository pr, may repeat again in 4 more hours as needed. Seek care if symptoms worsen. Consider daily Miralax and/or Dulcolax if symptoms persist. Then start Miralax and benefiber daily  Relevant Orders   DG Abd 2 Views   Ambulatory referral to Gastroenterology      I have discontinued Lannette Donath lidocaine, sucralfate, famciclovir, hyoscyamine, HYDROcodone-acetaminophen, Carboxymethylcellulose Sodium (ARTIFICIAL TEARS OP), ALPRAZolam, loratadine, SUMAtriptan, Emgality,  pomalidomide, Melatonin, LORazepam, famotidine, nitrofurantoin (macrocrystal-monohydrate), and doxycycline. I have also changed her magnesium oxide. Additionally, I am having her maintain her conjugated estrogens, acetaminophen, pantoprazole, aspirin EC, ondansetron, albuterol, Multiple Vitamins-Minerals (MULTIVITAMIN ADULT PO), carvedilol, topiramate, potassium chloride SA, ACETAMINOPHEN-BUTALBITAL, furosemide, metoCLOPramide, promethazine, triamterene-hydrochlorothiazide, Nurtec, montelukast, Vitamin D-3, prochlorperazine, fluticasone, amLODipine, cetirizine, cyclobenzaprine, meclizine, and Naproxen.  Meds ordered this encounter  Medications  . magnesium oxide (MAG-OX) 400 MG tablet    Sig: Take 1 tablet (400 mg total) by mouth 2 (two) times daily.    Dispense:  60 tablet    Refill:  1     Penni Homans, MD

## 2020-03-04 NOTE — Patient Instructions (Addendum)
[facility] call [phone] for your appointment to manage the headaches  Encouraged increased hydration and fiber in diet. Daily probiotics. If bowels not moving can use MOM 2 tbls po in 4 oz of warm prune juice by mouth every 2-3 days. If no results then repeat in 4 hours with  Dulcolax suppository pr, may repeat again in 4 more hours as needed. Seek care if symptoms worsen. Consider daily Miralax and/or Dulcolax if symptoms persist.   Then try Miralax mixed with Benefiber once to twice daily  Abdominal Pain, Adult Pain in the abdomen (abdominal pain) can be caused by many things. Often, abdominal pain is not serious and it gets better with no treatment or by being treated at home. However, sometimes abdominal pain is serious. Your health care provider will ask questions about your medical history and do a physical exam to try to determine the cause of your abdominal pain. Follow these instructions at home:  Medicines  Take over-the-counter and prescription medicines only as told by your health care provider.  Do not take a laxative unless told by your health care provider. General instructions  Watch your condition for any changes.  Drink enough fluid to keep your urine pale yellow.  Keep all follow-up visits as told by your health care provider. This is important. Contact a health care provider if:  Your abdominal pain changes or gets worse.  You are not hungry or you lose weight without trying.  You are constipated or have diarrhea for more than 2-3 days.  You have pain when you urinate or have a bowel movement.  Your abdominal pain wakes you up at night.  Your pain gets worse with meals, after eating, or with certain foods.  You are vomiting and cannot keep anything down.  You have a fever.  You have blood in your urine. Get help right away if:  Your pain does not go away as soon as your health care provider told you to expect.  You cannot stop  vomiting.  Your pain is only in areas of the abdomen, such as the right side or the left lower portion of the abdomen. Pain on the right side could be caused by appendicitis.  You have bloody or black stools, or stools that look like tar.  You have severe pain, cramping, or bloating in your abdomen.  You have signs of dehydration, such as: ? Dark urine, very little urine, or no urine. ? Cracked lips. ? Dry mouth. ? Sunken eyes. ? Sleepiness. ? Weakness.  You have trouble breathing or chest pain. Summary  Often, abdominal pain is not serious and it gets better with no treatment or by being treated at home. However, sometimes abdominal pain is serious.  Watch your condition for any changes.  Take over-the-counter and prescription medicines only as told by your health care provider.  Contact a health care provider if your abdominal pain changes or gets worse.  Get help right away if you have severe pain, cramping, or bloating in your abdomen. This information is not intended to replace advice given to you by your health care provider. Make sure you discuss any questions you have with your health care provider. Document Revised: 03/06/2019 Document Reviewed: 03/06/2019 Elsevier Patient Education  [place].

## 2020-03-04 NOTE — Assessment & Plan Note (Signed)
Asymptomatic repeat cbc today

## 2020-03-04 NOTE — Assessment & Plan Note (Signed)
Moves her bowels daily but only small, hard amounts now. No bloody or tarry stool. Encouraged increased hydration and fiber in diet. Daily probiotics. If bowels not moving can use MOM 2 tbls po in 4 oz of warm prune juice by mouth every 2-3 days. If no results then repeat in 4 hours with  Dulcolax suppository pr, may repeat again in 4 more hours as needed. Seek care if symptoms worsen. Consider daily Miralax and/or Dulcolax if symptoms persist. Then start Miralax and benefiber daily

## 2020-03-04 NOTE — Assessment & Plan Note (Addendum)
hgba1c acceptable, minimize simple carbs. Increase exercise as tolerated.  

## 2020-03-04 NOTE — Assessment & Plan Note (Signed)
She notes recently she has noted some trouble with food catching in her throat. Referred to gastroenterology for further evaluation

## 2020-03-04 NOTE — Assessment & Plan Note (Signed)
Well controlled, no changes to meds. Encouraged heart healthy diet such as the DASH diet and exercise as tolerated.  °

## 2020-03-04 NOTE — Assessment & Plan Note (Signed)
Persistent and daily she was referred to [facility] neurology in February but they tried to call her 3 x and they never got an answer so they closed the referral. Today she is given the phone number for [facility] neurology and she agrees to call them and make an appt.

## 2020-03-05 ENCOUNTER — Telehealth: Payer: Self-pay | Admitting: *Deleted

## 2020-03-05 ENCOUNTER — Telehealth: Payer: Self-pay | Admitting: Neurology

## 2020-03-05 ENCOUNTER — Other Ambulatory Visit: Payer: Self-pay | Admitting: *Deleted

## 2020-03-05 DIAGNOSIS — D649 Anemia, unspecified: Secondary | ICD-10-CM

## 2020-03-05 DIAGNOSIS — E876 Hypokalemia: Secondary | ICD-10-CM

## 2020-03-05 LAB — CBC WITH DIFFERENTIAL/PLATELET
Basophils Absolute: 0 10*3/uL (ref 0.0–0.1)
Basophils Relative: 1.1 % (ref 0.0–3.0)
Eosinophils Absolute: 0.1 10*3/uL (ref 0.0–0.7)
Eosinophils Relative: 2.1 % (ref 0.0–5.0)
HCT: 35.9 % — ABNORMAL LOW (ref 36.0–46.0)
Hemoglobin: 11.8 g/dL — ABNORMAL LOW (ref 12.0–15.0)
Lymphocytes Relative: 32 % (ref 12.0–46.0)
Lymphs Abs: 1.4 10*3/uL (ref 0.7–4.0)
MCHC: 32.8 g/dL (ref 30.0–36.0)
MCV: 90 fl (ref 78.0–100.0)
Monocytes Absolute: 0.3 10*3/uL (ref 0.1–1.0)
Monocytes Relative: 6.1 % (ref 3.0–12.0)
Neutro Abs: 2.5 10*3/uL (ref 1.4–7.7)
Neutrophils Relative %: 58.7 % (ref 43.0–77.0)
Platelets: 131 10*3/uL — ABNORMAL LOW (ref 150.0–400.0)
RBC: 3.99 Mil/uL (ref 3.87–5.11)
RDW: 14.8 % (ref 11.5–15.5)
WBC: 4.3 10*3/uL (ref 4.0–10.5)

## 2020-03-05 LAB — COMPREHENSIVE METABOLIC PANEL
ALT: 22 U/L (ref 0–35)
AST: 60 U/L — ABNORMAL HIGH (ref 0–37)
Albumin: 3.5 g/dL (ref 3.5–5.2)
Alkaline Phosphatase: 45 U/L (ref 39–117)
BUN: 17 mg/dL (ref 6–23)
CO2: 25 mEq/L (ref 19–32)
Calcium: 8.9 mg/dL (ref 8.4–10.5)
Chloride: 107 mEq/L (ref 96–112)
Creatinine, Ser: 0.69 mg/dL (ref 0.40–1.20)
GFR: 103.07 mL/min (ref >60.00)
Glucose, Bld: 138 mg/dL — ABNORMAL HIGH (ref 70–99)
Potassium: 3.3 mEq/L — ABNORMAL LOW (ref 3.5–5.1)
Sodium: 140 mEq/L (ref 135–145)
Total Bilirubin: 0.3 mg/dL (ref 0.2–1.2)
Total Protein: 7.3 g/dL (ref 6.0–8.3)

## 2020-03-05 LAB — MAGNESIUM: Magnesium: 1.8 mg/dL (ref 1.5–2.5)

## 2020-03-05 LAB — SEDIMENTATION RATE: Sed Rate: 100 mm/hr — ABNORMAL HIGH (ref 0–30)

## 2020-03-05 MED ORDER — POTASSIUM CHLORIDE CRYS ER 20 MEQ PO TBCR: 20.0000 meq | EXTENDED_RELEASE_TABLET | Freq: Every day | ORAL | 3 refills | Status: DC

## 2020-03-05 NOTE — Progress Notes (Signed)
pot

## 2020-03-05 NOTE — Telephone Encounter (Signed)
Can you please let her know that I have reviewed her chart. It looks like an order has already been placed for MRI brain. This is the same image we would need to assess headaches. She should be able to call and get this set up if cardiology has cleared her.

## 2020-03-05 NOTE — Telephone Encounter (Signed)
Patient came in today stating that she needs to see Dr. Jaynee Eagles about her headaches. She also states that she needs an MRI. Patient gave me a copy of her [employer] card and stated that her cardiologist, Dr. Ola Spurr, says that she can have an MRI with the type of implant that she has. I gave the copy of her medtronic card to Hilda Blades in records to scan in. I made patient an appointment with Amy on 5/11 for her headaches.

## 2020-03-05 NOTE — Telephone Encounter (Signed)
Patient wanted to let you know that she got an appointment with neurology on 03/19/20

## 2020-03-05 NOTE — Telephone Encounter (Signed)
Noted TY

## 2020-03-07 ENCOUNTER — Ambulatory Visit: Payer: Medicare Other | Admitting: Gastroenterology

## 2020-03-07 DIAGNOSIS — R519 Headache, unspecified: Secondary | ICD-10-CM | POA: Diagnosis not present

## 2020-03-07 DIAGNOSIS — G43709 Chronic migraine without aura, not intractable, without status migrainosus: Secondary | ICD-10-CM | POA: Diagnosis not present

## 2020-03-07 DIAGNOSIS — G43019 Migraine without aura, intractable, without status migrainosus: Secondary | ICD-10-CM | POA: Diagnosis not present

## 2020-03-08 ENCOUNTER — Other Ambulatory Visit (INDEPENDENT_AMBULATORY_CARE_PROVIDER_SITE_OTHER): Payer: Medicare Other

## 2020-03-08 ENCOUNTER — Other Ambulatory Visit: Payer: Self-pay

## 2020-03-08 DIAGNOSIS — R790 Abnormal level of blood mineral: Secondary | ICD-10-CM

## 2020-03-08 LAB — MAGNESIUM: Magnesium: 2.2 mg/dL (ref 1.5–2.5)

## 2020-03-12 DIAGNOSIS — R519 Headache, unspecified: Secondary | ICD-10-CM | POA: Diagnosis not present

## 2020-03-12 DIAGNOSIS — Z03818 Encounter for observation for suspected exposure to other biological agents ruled out: Secondary | ICD-10-CM | POA: Diagnosis not present

## 2020-03-12 DIAGNOSIS — I5022 Chronic systolic (congestive) heart failure: Secondary | ICD-10-CM | POA: Diagnosis not present

## 2020-03-14 ENCOUNTER — Ambulatory Visit (INDEPENDENT_AMBULATORY_CARE_PROVIDER_SITE_OTHER): Payer: Medicare Other | Admitting: Family Medicine

## 2020-03-14 ENCOUNTER — Encounter: Payer: Self-pay | Admitting: Family Medicine

## 2020-03-14 ENCOUNTER — Other Ambulatory Visit: Payer: Self-pay

## 2020-03-14 VITALS — BP 127/86 | HR 92 | Temp 97.0°F | Resp 19 | Ht 66.0 in | Wt 191.0 lb

## 2020-03-14 DIAGNOSIS — R350 Frequency of micturition: Secondary | ICD-10-CM

## 2020-03-14 DIAGNOSIS — H9203 Otalgia, bilateral: Secondary | ICD-10-CM

## 2020-03-14 LAB — POCT URINALYSIS DIP (MANUAL ENTRY)
Bilirubin, UA: NEGATIVE
Blood, UA: NEGATIVE
Glucose, UA: NEGATIVE mg/dL
Ketones, POC UA: NEGATIVE mg/dL
Nitrite, UA: NEGATIVE
Spec Grav, UA: 1.02 (ref 1.010–1.025)
Urobilinogen, UA: 0.2 E.U./dL
pH, UA: 6.5 (ref 5.0–8.0)

## 2020-03-14 MED ORDER — CEPHALEXIN 500 MG PO CAPS: 500.0000 mg | ORAL_CAPSULE | Freq: Two times a day (BID) | ORAL | 0 refills | Status: DC

## 2020-03-14 NOTE — Patient Instructions (Addendum)
Your ears appear normal.  However, I think you may have a UTI.  We are going to send your urine for a culture today We can have you see an ENT provider about your recurrent ear pain- will set this up for you asap   If you are getting worse or not feeling better in the next 1-2 days please let us know

## 2020-03-14 NOTE — Progress Notes (Addendum)
[facility] [address] [place], [place] [address] [phone]  Date:  03/14/2020   Name:  Tami [name]   DOB:  Feb 10, 1956   MRN:  [id number]  PCP:  Mosie Lukes, MD    Chief Complaint: Ear Pain (bilateral ear pain)   History of Present Illness:  Tami [name] is a 64 y.o. very pleasant female patient who presents with the following:  Medically complex pt of Dr Charlett Blake here today with concern of ear pain  Patient has history of stroke, ocular migraine, hypertension, CHF, multiple myeloma I saw her for ear pain also in December of last year-at that time we could not find anything on exam.  She wanted to use antibiotics and I called in a course of amoxicillin.  Her ear pain did seem to resolve in the meantime She notes that both her ears hurt "like there is something in them" Started 2 days ago Her ears feel stuffy, she notes that her nose tends to feel dry and congested This am she noted that her "mood swing was off"   No fever noted  No nausea or vomiting but she notes that her appetite is not great  She states that she just does not feel well in general, but cannot put her finger in a specific symptoms She does admit to urinary frequency for the last couple of weeks  Patient Active Problem List   Diagnosis Date Noted  . Low magnesium level 03/04/2020  . Thrombocytopenia ([place]) 03/04/2020  . Dysphagia 03/04/2020  . Constipation 03/04/2020  . Sensation of pressure in bladder area 01/17/2020  . Hyperglycemia 01/17/2020  . Peripheral neuropathy 11/15/2019  . Chronic migraine without aura, with intractable migraine, so stated, with status migrainosus 10/17/2019  . ETD (Eustachian tube dysfunction), bilateral 09/12/2019  . Urinary frequency 07/17/2019  . Headache 07/17/2019  . Palpitation 05/15/2019  . Urticaria 02/22/2019  . Conjunctivitis 10/17/2018  . Diarrhea 09/04/2018  . Thyroid nodule 08/26/2018  . Drug  rash 08/26/2018  . Multiple myeloma ([place]) 07/20/2018  . Multiple myeloma not having achieved remission ([place]) 07/20/2018  . Hematuria 07/11/2018  . Seasonal allergies 06/23/2018  . Right shoulder pain 06/07/2018  . Neck pain 06/07/2018  . Paresthesias 06/07/2018  . 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 Pain in thoracic spine 03/18/2018  . Thoracic back pain 02/15/2018  . 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 Cystitis 10/25/2017  . C. difficile colitis 09/15/2017  . Hypokalemia 08/20/2017  . Anemia 08/20/2017  . Elevated sed rate 08/15/2017  . 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 Epigastric pain 03/22/2017  . Myalgia 12/31/2016  . SOB (shortness of breath) 07/26/2016  . Abnormal SPEP 07/26/2016  . Low back pain 07/14/2016  . Insomnia 12/29/2015  . Tension headache 10/21/2015  . Muscle spasms of neck 10/18/2015  . Dysuria 10/13/2015  . Sinusitis 07/07/2015  . AP (abdominal pain) 07/07/2015  . Cardiomyopathy ([place]) 04/19/2015  . Chest wall pain 04/19/2015  . Obstructive sleep apnea 03/31/2015  . Congestive heart failure ([place]) 02/24/2015  . Arthritis   . Hyperlipidemia   . Hypertension   . Depression   . Stroke ([place])   . GERD (gastroesophageal reflux disease)   . Bowel obstruction ([place]) 11/09/2014    Past Medical History:  Diagnosis Date  . Abnormal SPEP 07/26/2016  . Arthritis    knees, hands  . Back pain 07/14/2016  . Bowel obstruction ([place])  11/2014  . C. difficile diarrhea   . CHF (congestive heart failure) ([place])   . Colitis   . Congestive heart failure ([place]) 02/24/2015   S/p pacemaker  . Depression   . Elevated sed rate 08/15/2017  . Epigastric pain 03/22/2017  . GERD (gastroesophageal reflux disease)   . Heart disease   . Hyperlipidemia   . Hypertension   . 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 Low back pain 07/14/2016  . 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 Multiple myeloma ([place]) 07/20/2018  .  Multiple myeloma not having achieved remission ([place]) 07/20/2018  . Myalgia 12/31/2016  . Obstructive sleep apnea 03/31/2015  . SOB (shortness of breath) 04/09/2016  . Stroke ([place])    TIAs  . UTI (urinary tract infection)     Past Surgical History:  Procedure Laterality Date  . ABDOMINAL HYSTERECTOMY     menorraghia, 2006, total  . CHOLECYSTECTOMY    . COLONOSCOPY  2018   [facility] per patient  . ESOPHAGOGASTRODUODENOSCOPY  2018   [facility]   . KNEE SURGERY     right, repair torn torn cartialage  . PACEMAKER INSERTION  08/2014  . TONSILLECTOMY    . TUBAL LIGATION      Social History   Tobacco Use  . Smoking status: Never Smoker  . Smokeless tobacco: Never Used  Substance Use Topics  . Alcohol use: No  . Drug use: No    Family History  Problem Relation Age of Onset  . Diabetes Mother   . Hypertension Mother   . Heart disease Mother        s/p 1 stent  . Hyperlipidemia Mother   . Arthritis Mother   . Cancer Father        COLON  . Colon cancer Father 56  . Irritable bowel syndrome Sister   . Hyperlipidemia Daughter   . Hypertension Daughter   . Hypertension Maternal Grandmother   . Arthritis Maternal Grandmother   . Heart disease Maternal Grandfather        MI  . Hypertension Maternal Grandfather   . Arthritis Maternal Grandfather   . Hypertension Son   . Esophageal cancer Neg Hx     Allergies  Allergen Reactions  . Benazepril Anaphylaxis and Swelling    angioedema Throat and lip swelling  . Ondansetron Hcl Hives    Redness and hives post IV admin on 07/05/17  . Codeine Nausea And Vomiting  . Morphine Hives    Redness and hives noted post IV admin on 07/05/17    Medication list has been reviewed and updated.  Current Outpatient Medications on File Prior to Visit  Medication Sig Dispense Refill  . acetaminophen (TYLENOL) 325 MG tablet Take 650 mg by mouth.    . ACETAMINOPHEN-BUTALBITAL 50-325 MG TABS Take 1 tablet by mouth 2  (two) times daily as needed. 120 tablet 1  . albuterol (PROVENTIL HFA;VENTOLIN HFA) 108 (90 Base) MCG/ACT inhaler Inhale 2 puffs into the lungs every 6 (six) hours as needed for wheezing or shortness of breath. 1 Inhaler 2  . amLODipine (NORVASC) 10 MG tablet Take 1 tablet (10 mg total) by mouth 2 (two) times daily. 180 tablet 1  . aspirin EC 81 MG tablet Take 1 tablet (81 mg total) by mouth daily. 90 tablet 1  . carvedilol (COREG) 25 MG tablet Take 1 tablet (25 mg total) by mouth 2 (two) times daily with a meal. 180 tablet 1  . cetirizine (ZYRTEC) 10 MG tablet  Take 10 mg by mouth daily.    . Cholecalciferol (VITAMIN D-3) 25 MCG (1000 UT) CAPS Take by mouth.    . conjugated estrogens (PREMARIN) vaginal cream Place vaginally.    . cyclobenzaprine (FLEXERIL) 10 MG tablet Take 1 tablet (10 mg total) by mouth at bedtime. 7 tablet 0  . fluticasone (FLONASE) 50 MCG/ACT nasal spray Place 2 sprays into both nostrils daily. 16 g 0  . furosemide (LASIX) 20 MG tablet Take 1 tablet (20 mg total) by mouth daily as needed for fluid or edema. 30 tablet 2  . magnesium oxide (MAG-OX) 400 MG tablet Take 1 tablet (400 mg total) by mouth 2 (two) times daily. 60 tablet 1  . meclizine (ANTIVERT) 12.5 MG tablet Take 12.5 mg by mouth 3 (three) times daily.    . metoCLOPramide (REGLAN) 10 MG tablet Take 1 tablet (10 mg total) by mouth 4 (four) times daily. For migraine or headache or nausea 30 tablet 6  . montelukast (SINGULAIR) 10 MG tablet Take 1 tablet (10 mg total) by mouth at bedtime. Start taking Singulair on 11/16/2019. 20 tablet 0  . Multiple Vitamins-Minerals (MULTIVITAMIN ADULT PO) Take by mouth.    . Naproxen 375 MG TBEC Take 1 tablet (375 mg total) by mouth every morning. 30 tablet 2  . ondansetron (ZOFRAN) 4 MG tablet Take 1 tablet (4 mg total) by mouth every 8 (eight) hours as needed for nausea or vomiting. 30 tablet 1  . pantoprazole (PROTONIX) 40 MG tablet Take 1 tablet (40 mg total) by mouth 2 (two) times  daily. 180 tablet 1  . potassium chloride SA (KLOR-CON) 20 MEQ tablet Take 1 tablet (20 mEq total) by mouth daily. 30 tablet 3  . prochlorperazine (COMPAZINE) 10 MG tablet Take 1 tablet (10 mg total) by mouth every 6 (six) hours as needed (Nausea or vomiting). 30 tablet 1  . promethazine (PHENERGAN) 25 MG tablet Take 1 tablet (25 mg total) by mouth every 8 (eight) hours as needed for nausea or vomiting. 30 tablet 2  . Rimegepant Sulfate (NURTEC) 75 MG TBDP Take 75 mg by mouth daily as needed. For migraines. Take as close to onset of migraine as possible. One daily maximum. 10 tablet 6  . topiramate (TOPAMAX) 50 MG tablet Take 1 tablet (50 mg total) by mouth 2 (two) times daily. 60 tablet 3  . triamterene-hydrochlorothiazide (MAXZIDE-25) 37.5-25 MG tablet Take 1 tablet by mouth daily.     No current facility-administered medications on file prior to visit.    Review of Systems:  As per HPI- otherwise negative.   Physical Examination: Vitals:   03/14/20 1448  BP: 127/86  Pulse: 92  Resp: 19  Temp: (!) 97 F (36.1 C)  SpO2: 97%   Vitals:   03/14/20 1448  Weight: 191 lb (86.6 kg)  Height: '5\' 6"'$  (1.676 m)   Body mass index is 30.83 kg/m. Ideal Body Weight: Weight in (lb) to have BMI = 25: 154.6  GEN: no acute distress.  Obese, otherwise looks well HEENT: Atraumatic, Normocephalic.   Bilateral TM wnl, oropharynx normal.  PEERL,EOMI.   There is some nasal cavity congestion Ears and Nose: No external deformity. CV: RRR, No M/G/R. No JVD. No thrill. No extra heart sounds. PULM: CTA B, no wheezes, crackles, rhonchi. No retractions. No resp. distress. No accessory muscle use. ABD: S, NT, ND, +BS. No rebound. No HSM. EXTR: No c/c/e PSYCH: Normally interactive. Conversant.   Results for orders placed or performed in visit on  03/14/20  POCT urinalysis dipstick  Result Value Ref Range   Color, UA yellow yellow   Clarity, UA cloudy (A) clear   Glucose, UA negative negative mg/dL    Bilirubin, UA negative negative   Ketones, POC UA negative negative mg/dL   Spec Grav, UA 1.020 1.010 - 1.025   Blood, UA negative negative   pH, UA 6.5 5.0 - 8.0   Protein Ur, POC trace (A) negative mg/dL   Urobilinogen, UA 0.2 0.2 or 1.0 E.U./dL   Nitrite, UA Negative Negative   Leukocytes, UA Small (1+) (A) Negative     Assessment and Plan: Urinary frequency - Plan: Urine Culture, POCT urinalysis dipstick, cephALEXin (KEFLEX) 500 MG capsule  Ear pain, bilateral - Plan: Ambulatory referral to ENT  Patient here today with concern of recurrent bilateral ear pain.  Again, today her exam is normal I explained that I am not sure why her ears are bothering her, she would like a referral to ENT which I am glad to place for her today She may have a urinary tract infection as the source of her general malaise.  Urine culture is pending, I started her on cephalexin  Explained to patient that I am not certain why she is feeling generally poor today.  We are glad to have her seen in the ER for further evaluation if she would like.  She declines at this time, wishes to take her antibiotic for UTI and see how she does.  She will follow up if not feeling better This visit occurred during the SARS-CoV-2 public health emergency.  Safety protocols were in place, including screening questions prior to the visit, additional usage of staff PPE, and extensive cleaning of exam room while observing appropriate contact time as indicated for disinfecting solutions.    Signed Lamar Blinks, MD Addendum 5/9, received her urine culture as below.  Letter to patient  Results for orders placed or performed in visit on 03/14/20  Urine Culture   Specimen: Urine  Result Value Ref Range   MICRO NUMBER: [id number]    SPECIMEN QUALITY: Adequate    Sample Source NOT GIVEN    STATUS: FINAL    ISOLATE 1:      Growth of mixed flora was isolated, suggesting probable contamination. No further testing will be performed.  If clinically indicated, recollection using a method to minimize contamination, with prompt transfer to Urine Culture Transport Tube, is  recommended.   POCT urinalysis dipstick  Result Value Ref Range   Color, UA yellow yellow   Clarity, UA cloudy (A) clear   Glucose, UA negative negative mg/dL   Bilirubin, UA negative negative   Ketones, POC UA negative negative mg/dL   Spec Grav, UA 1.020 1.010 - 1.025   Blood, UA negative negative   pH, UA 6.5 5.0 - 8.0   Protein Ur, POC trace (A) negative mg/dL   Urobilinogen, UA 0.2 0.2 or 1.0 E.U./dL   Nitrite, UA Negative Negative   Leukocytes, UA Small (1+) (A) Negative

## 2020-03-15 DIAGNOSIS — Z95810 Presence of automatic (implantable) cardiac defibrillator: Secondary | ICD-10-CM | POA: Diagnosis not present

## 2020-03-15 LAB — URINE CULTURE
MICRO NUMBER:: 10447454
SPECIMEN QUALITY:: ADEQUATE

## 2020-03-19 ENCOUNTER — Encounter: Payer: Self-pay | Admitting: Family Medicine

## 2020-03-19 ENCOUNTER — Ambulatory Visit: Payer: Medicaid Other | Admitting: Family Medicine

## 2020-03-19 NOTE — Telephone Encounter (Signed)
I called pt and LMVM for her that will not take off schedule as yet, because I did not know how she was feeling.  If feeling ok, can wait until after MRI done.  Please let us know.

## 2020-03-19 NOTE — Telephone Encounter (Signed)
Pt has called to report that her MRI is scheduled for next month.  She is asking if Amy,NP just wants to wait and see her after.  Please call before appointment time today

## 2020-03-19 NOTE — Telephone Encounter (Signed)
If she is feeling ok otherwise, I feel it is ok to wait until after MRI for follow up. TY!

## 2020-03-21 ENCOUNTER — Ambulatory Visit: Payer: Medicare Other | Admitting: Family Medicine

## 2020-03-29 ENCOUNTER — Ambulatory Visit: Payer: Medicare Other | Admitting: Gastroenterology

## 2020-04-01 DIAGNOSIS — R519 Headache, unspecified: Secondary | ICD-10-CM | POA: Diagnosis not present

## 2020-04-01 DIAGNOSIS — R9089 Other abnormal findings on diagnostic imaging of central nervous system: Secondary | ICD-10-CM | POA: Diagnosis not present

## 2020-04-02 DIAGNOSIS — R519 Headache, unspecified: Secondary | ICD-10-CM | POA: Diagnosis not present

## 2020-04-02 DIAGNOSIS — I10 Essential (primary) hypertension: Secondary | ICD-10-CM | POA: Diagnosis not present

## 2020-04-02 DIAGNOSIS — E7800 Pure hypercholesterolemia, unspecified: Secondary | ICD-10-CM | POA: Diagnosis not present

## 2020-04-02 DIAGNOSIS — R300 Dysuria: Secondary | ICD-10-CM | POA: Diagnosis not present

## 2020-04-02 DIAGNOSIS — N3000 Acute cystitis without hematuria: Secondary | ICD-10-CM | POA: Diagnosis not present

## 2020-04-02 DIAGNOSIS — Z0000 Encounter for general adult medical examination without abnormal findings: Secondary | ICD-10-CM | POA: Diagnosis not present

## 2020-04-02 DIAGNOSIS — M810 Age-related osteoporosis without current pathological fracture: Secondary | ICD-10-CM | POA: Diagnosis not present

## 2020-04-02 DIAGNOSIS — R5383 Other fatigue: Secondary | ICD-10-CM | POA: Diagnosis not present

## 2020-04-02 DIAGNOSIS — D72819 Decreased white blood cell count, unspecified: Secondary | ICD-10-CM | POA: Diagnosis not present

## 2020-04-02 DIAGNOSIS — R7303 Prediabetes: Secondary | ICD-10-CM | POA: Diagnosis not present

## 2020-04-03 ENCOUNTER — Encounter: Payer: Self-pay | Admitting: *Deleted

## 2020-04-04 ENCOUNTER — Ambulatory Visit (INDEPENDENT_AMBULATORY_CARE_PROVIDER_SITE_OTHER): Payer: Medicare Other | Admitting: Medical

## 2020-04-04 ENCOUNTER — Other Ambulatory Visit (INDEPENDENT_AMBULATORY_CARE_PROVIDER_SITE_OTHER): Payer: Medicare Other

## 2020-04-04 ENCOUNTER — Other Ambulatory Visit: Payer: Self-pay

## 2020-04-04 VITALS — BP 130/82 | HR 88 | Resp 18 | Ht 66.0 in | Wt 196.0 lb

## 2020-04-04 DIAGNOSIS — D649 Anemia, unspecified: Secondary | ICD-10-CM

## 2020-04-04 DIAGNOSIS — E876 Hypokalemia: Secondary | ICD-10-CM | POA: Diagnosis not present

## 2020-04-04 DIAGNOSIS — T7840XA Allergy, unspecified, initial encounter: Secondary | ICD-10-CM | POA: Diagnosis not present

## 2020-04-04 DIAGNOSIS — L089 Local infection of the skin and subcutaneous tissue, unspecified: Secondary | ICD-10-CM | POA: Diagnosis not present

## 2020-04-04 DIAGNOSIS — R300 Dysuria: Secondary | ICD-10-CM | POA: Diagnosis not present

## 2020-04-04 LAB — CBC WITH DIFFERENTIAL/PLATELET
Basophils Absolute: 0 10*3/uL (ref 0.0–0.1)
Basophils Relative: 1 % (ref 0.0–3.0)
Eosinophils Absolute: 0.1 10*3/uL (ref 0.0–0.7)
Eosinophils Relative: 2.3 % (ref 0.0–5.0)
HCT: 35.2 % — ABNORMAL LOW (ref 36.0–46.0)
Hemoglobin: 11.5 g/dL — ABNORMAL LOW (ref 12.0–15.0)
Lymphocytes Relative: 22 % (ref 12.0–46.0)
Lymphs Abs: 0.7 10*3/uL (ref 0.7–4.0)
MCHC: 32.8 g/dL (ref 30.0–36.0)
MCV: 89.5 fl (ref 78.0–100.0)
Monocytes Absolute: 0.3 10*3/uL (ref 0.1–1.0)
Monocytes Relative: 10.2 % (ref 3.0–12.0)
Neutro Abs: 2 10*3/uL (ref 1.4–7.7)
Neutrophils Relative %: 64.5 % (ref 43.0–77.0)
Platelets: 129 10*3/uL — ABNORMAL LOW (ref 150.0–400.0)
RBC: 3.93 Mil/uL (ref 3.87–5.11)
RDW: 14.2 % (ref 11.5–15.5)
WBC: 3.1 10*3/uL — ABNORMAL LOW (ref 4.0–10.5)

## 2020-04-04 LAB — POC URINALSYSI DIPSTICK (AUTOMATED)
Bilirubin, UA: NEGATIVE
Blood, UA: POSITIVE
Glucose, UA: NEGATIVE
Ketones, UA: NEGATIVE
Nitrite, UA: NEGATIVE
Protein, UA: POSITIVE — AB
Spec Grav, UA: 1.025 (ref 1.010–1.025)
Urobilinogen, UA: 0.2 E.U./dL
pH, UA: 6 (ref 5.0–8.0)

## 2020-04-04 LAB — COMPREHENSIVE METABOLIC PANEL
ALT: 13 U/L (ref 0–35)
AST: 23 U/L (ref 0–37)
Albumin: 3.6 g/dL (ref 3.5–5.2)
Alkaline Phosphatase: 48 U/L (ref 39–117)
BUN: 13 mg/dL (ref 6–23)
CO2: 27 mEq/L (ref 19–32)
Calcium: 9.1 mg/dL (ref 8.4–10.5)
Chloride: 107 mEq/L (ref 96–112)
Creatinine, Ser: 0.58 mg/dL (ref 0.40–1.20)
GFR: 125.91 mL/min (ref >60.00)
Glucose, Bld: 104 mg/dL — ABNORMAL HIGH (ref 70–99)
Potassium: 3.6 mEq/L (ref 3.5–5.1)
Sodium: 139 mEq/L (ref 135–145)
Total Bilirubin: 0.4 mg/dL (ref 0.2–1.2)
Total Protein: 6.7 g/dL (ref 6.0–8.3)

## 2020-04-04 MED ORDER — SULFAMETHOXAZOLE-TRIMETHOPRIM 800-160 MG PO TABS
1.0000 | ORAL_TABLET | Freq: Two times a day (BID) | ORAL | 0 refills | Status: DC
Start: 2020-04-04 — End: 2020-04-06

## 2020-04-04 MED ORDER — PREDNISONE 10 MG (21) PO TBPK
ORAL_TABLET | ORAL | 0 refills | Status: DC
Start: 2020-04-04 — End: 2020-04-11

## 2020-04-04 MED ORDER — HYDROXYZINE HCL 10 MG PO TABS
10.0000 mg | ORAL_TABLET | Freq: Three times a day (TID) | ORAL | 0 refills | Status: DC | PRN
Start: 2020-04-04 — End: 2020-07-30

## 2020-04-04 NOTE — Progress Notes (Signed)
Subjective:    [name], female    DOB: [birth year], 64 y.o.   MRN: [id number]  HPI  Pt in for possible left forearm insect bite. She was on vacation in [place] and woke up with bump on her forearm. She itched the skin and shows me small tiny broken area of skin. The area is little swollen and tender.  Also itching to both lower rib areas. Hx of being very allergic to outdoor environment. She has to get others to cut grass. Describes in past having to get taper prednisone. Pt is not diabetic.   Recent dysuria when she got back from vacation. Had macrobid rx'd. Done at [facility]. Culture may not have been done since no call.  Review of Systems  Constitutional: Negative for chills, fatigue and fever.  Respiratory: Negative for cough, chest tightness, shortness of breath and wheezing.   Cardiovascular: Negative for chest pain and palpitations.  Gastrointestinal: Negative for abdominal pain.  Skin:       See hpi.   Also itching on thorax.  Hematological: Negative for adenopathy. Does not bruise/bleed easily.   Past Medical History:  Diagnosis Date  . Abnormal SPEP 07/26/2016  . Arthritis    knees, hands  . Back pain 07/14/2016  . Bowel obstruction ([place]) 11/2014  . C. difficile diarrhea   . CHF (congestive heart failure) ([place])   . Colitis   . Congestive heart failure ([place]) 02/24/2015   S/p pacemaker  . Depression   . Elevated sed rate 08/15/2017  . Epigastric pain 03/22/2017  . GERD (gastroesophageal reflux disease)   . Heart disease   . Hyperlipidemia   . Hypertension   . 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 Low back pain 07/14/2016  . 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 Multiple myeloma ([place]) 07/20/2018  . Multiple myeloma not having achieved remission ([place]) 07/20/2018  . Myalgia 12/31/2016  . Obstructive sleep apnea 03/31/2015  . SOB (shortness of breath) 04/09/2016  . Stroke ([place])    TIAs  . UTI (urinary tract infection)      Social History   Socioeconomic History  . Marital status: Divorced    Spouse name: [name]  . Number of children: 3  . Years of education: [name]  . Highest education level: High school graduate  Occupational History  . [name]  Tobacco Use  . Smoking status: Never Smoker  . Smokeless tobacco: Never Used  Substance and Sexual Activity  . Alcohol use: No  . Drug use: No  . Sexual activity: [name]  Other Topics Concern  . [name]  Social History Narrative   Lives at home alone   Retired   Caffeine: coffee   Social Determinants of Health   Financial Resource Strain:   . Difficulty of Paying Living Expenses:   Food Insecurity:   . Worried About Charity fundraiser in the Last Year:   . Arboriculturist in the Last Year:   Transportation Needs:   . Film/video editor (Medical):   Marland Kitchen Lack of Transportation (Non-Medical):   Physical Activity:   . Days of Exercise per Week:   . Minutes of Exercise per Session:   Stress:   . Feeling of Stress :   Social Connections:   . Frequency of Communication with Friends and Family:   . Frequency of Social Gatherings with Friends and Family:   . Attends Religious Services:   .  Active Member of Clubs or Organizations:   . Attends Archivist Meetings:   Marland Kitchen Marital Status:   Intimate Partner Violence:   . Fear of Current or Ex-Partner:   . Emotionally Abused:   Marland Kitchen Physically Abused:   . Sexually Abused:     Past Surgical History:  Procedure Laterality Date  . ABDOMINAL HYSTERECTOMY     menorraghia, 2006, total  . CHOLECYSTECTOMY    . COLONOSCOPY  2018   [facility] per patient  . ESOPHAGOGASTRODUODENOSCOPY  2018   [facility]   . KNEE SURGERY     right, repair torn torn cartialage  . PACEMAKER INSERTION  08/2014  . TONSILLECTOMY    . TUBAL LIGATION      Family History  Problem Relation Age of Onset  . Diabetes Mother   . Hypertension Mother   . Heart disease  Mother        s/p 1 stent  . Hyperlipidemia Mother   . Arthritis Mother   . Cancer Father        COLON  . Colon cancer Father 27  . Irritable bowel syndrome Sister   . Hyperlipidemia Daughter   . Hypertension Daughter   . Hypertension Maternal Grandmother   . Arthritis Maternal Grandmother   . Heart disease Maternal Grandfather        MI  . Hypertension Maternal Grandfather   . Arthritis Maternal Grandfather   . Hypertension Son   . Esophageal cancer Neg Hx     Allergies  Allergen Reactions  . Benazepril Anaphylaxis and Swelling    angioedema Throat and lip swelling  . Ondansetron Hcl Hives    Redness and hives post IV admin on 07/05/17  . Codeine Nausea And Vomiting  . Morphine Hives    Redness and hives noted post IV admin on 07/05/17    Current Outpatient Medications on File Prior to Visit  Medication Sig Dispense Refill  . acetaminophen (TYLENOL) 325 MG tablet Take 650 mg by mouth.    . ACETAMINOPHEN-BUTALBITAL 50-325 MG TABS Take 1 tablet by mouth 2 (two) times daily as needed. 120 tablet 1  . albuterol (PROVENTIL HFA;VENTOLIN HFA) 108 (90 Base) MCG/ACT inhaler Inhale 2 puffs into the lungs every 6 (six) hours as needed for wheezing or shortness of breath. 1 Inhaler 2  . amLODipine (NORVASC) 10 MG tablet Take 1 tablet (10 mg total) by mouth 2 (two) times daily. 180 tablet 1  . aspirin EC 81 MG tablet Take 1 tablet (81 mg total) by mouth daily. 90 tablet 1  . carvedilol (COREG) 25 MG tablet Take 1 tablet (25 mg total) by mouth 2 (two) times daily with a meal. 180 tablet 1  . cephALEXin (KEFLEX) 500 MG capsule Take 1 capsule (500 mg total) by mouth 2 (two) times daily. 14 capsule 0  . cetirizine (ZYRTEC) 10 MG tablet Take 10 mg by mouth daily.    . Cholecalciferol (VITAMIN D-3) 25 MCG (1000 UT) CAPS Take by mouth.    . conjugated estrogens (PREMARIN) vaginal cream Place vaginally.    . cyclobenzaprine (FLEXERIL) 10 MG tablet Take 1 tablet (10 mg total) by mouth at  bedtime. 7 tablet 0  . fluticasone (FLONASE) 50 MCG/ACT nasal spray Place 2 sprays into both nostrils daily. 16 g 0  . furosemide (LASIX) 20 MG tablet Take 1 tablet (20 mg total) by mouth daily as needed for fluid or edema. 30 tablet 2  . magnesium oxide (MAG-OX) 400 MG tablet Take  1 tablet (400 mg total) by mouth 2 (two) times daily. 60 tablet 1  . meclizine (ANTIVERT) 12.5 MG tablet Take 12.5 mg by mouth 3 (three) times daily.    . metoCLOPramide (REGLAN) 10 MG tablet Take 1 tablet (10 mg total) by mouth 4 (four) times daily. For migraine or headache or nausea 30 tablet 6  . montelukast (SINGULAIR) 10 MG tablet Take 1 tablet (10 mg total) by mouth at bedtime. Start taking Singulair on 11/16/2019. 20 tablet 0  . Multiple Vitamins-Minerals (MULTIVITAMIN ADULT PO) Take by mouth.    . Naproxen 375 MG TBEC Take 1 tablet (375 mg total) by mouth every morning. 30 tablet 2  . ondansetron (ZOFRAN) 4 MG tablet Take 1 tablet (4 mg total) by mouth every 8 (eight) hours as needed for nausea or vomiting. 30 tablet 1  . pantoprazole (PROTONIX) 40 MG tablet Take 1 tablet (40 mg total) by mouth 2 (two) times daily. 180 tablet 1  . potassium chloride SA (KLOR-CON) 20 MEQ tablet Take 1 tablet (20 mEq total) by mouth daily. 30 tablet 3  . prochlorperazine (COMPAZINE) 10 MG tablet Take 1 tablet (10 mg total) by mouth every 6 (six) hours as needed (Nausea or vomiting). 30 tablet 1  . promethazine (PHENERGAN) 25 MG tablet Take 1 tablet (25 mg total) by mouth every 8 (eight) hours as needed for nausea or vomiting. 30 tablet 2  . Rimegepant Sulfate (NURTEC) 75 MG TBDP Take 75 mg by mouth daily as needed. For migraines. Take as close to onset of migraine as possible. One daily maximum. 10 tablet 6  . topiramate (TOPAMAX) 50 MG tablet Take 1 tablet (50 mg total) by mouth 2 (two) times daily. 60 tablet 3  . triamterene-hydrochlorothiazide (MAXZIDE-25) 37.5-25 MG tablet Take 1 tablet by mouth daily.     No current  facility-administered medications on file prior to visit.    BP 130/82 (BP Location: Left Arm, Patient Position: Sitting, Cuff Size: Large)   Pulse 88   Resp 18   Ht '5\' 6"'$  (1.676 m)   Wt 196 lb (88.9 kg)   SpO2 96%   BMI 31.64 kg/m       Objective:   Physical Exam  General- No acute distress. Pleasant patient. Neck- Full range of motion, no jvd Lungs- Clear, even and unlabored. Heart- regular rate and rhythm. Neurologic- CNII- XII grossly intact.  Left forearm- 2 cm approximate slight raised, hyperpignted area with induration. And small/tiny break down of skin where she scratched area.  Skin- no obvious rash on thorax.  Abdomen- soft, nt, nd, +bs, no rebound or guarding. Back- no cva tenderness.        Assessment & Plan:  You appear to have skin infection left forearm after insect bite. Rx bactrim DS twice daily.  For hx of allergic reactions with thorax itching rx taper prednisone and low dose hydroxyzine. Rx advisement.  For recent dysuria stop macrobid and start bactrim. Bactrim can cover skin and urine. Will send urine for culture.  Follow up in 7 days or as needed  Time spent with patient today was 25  minutes which consisted of chart review, discussing diagnosis, work up, treatment and documentation.

## 2020-04-04 NOTE — Patient Instructions (Signed)
You appear to have skin infection left forearm after insect bite. Rx bactrim DS twice daily.  For hx of allergic reactions with thorax itching rx taper prednisone and low dose hydroxyzine. Rx advisement.  For recent dysuria stop macrobid and start bactrim. Bactrim can cover skin and urine. Will send urine for culture.  Follow up in 7 days or as needed

## 2020-04-05 ENCOUNTER — Ambulatory Visit: Payer: Medicare Other | Admitting: Hematology & Oncology

## 2020-04-05 ENCOUNTER — Other Ambulatory Visit: Payer: Medicare Other

## 2020-04-06 ENCOUNTER — Telehealth: Payer: Self-pay | Admitting: Medical

## 2020-04-06 LAB — URINE CULTURE
MICRO NUMBER:: 10527166
SPECIMEN QUALITY:: ADEQUATE

## 2020-04-06 MED ORDER — AMOXICILLIN-POT CLAVULANATE 875-125 MG PO TABS: 1.0000 | ORAL_TABLET | Freq: Two times a day (BID) | ORAL | 0 refills | Status: DC

## 2020-04-06 NOTE — Telephone Encounter (Signed)
Rx augmentin sent to pt pharmacy. 

## 2020-04-09 DIAGNOSIS — N3000 Acute cystitis without hematuria: Secondary | ICD-10-CM | POA: Diagnosis not present

## 2020-04-10 ENCOUNTER — Other Ambulatory Visit: Payer: Medicare Other

## 2020-04-10 ENCOUNTER — Ambulatory Visit: Payer: Medicare Other | Admitting: Hematology & Oncology

## 2020-04-11 ENCOUNTER — Ambulatory Visit (INDEPENDENT_AMBULATORY_CARE_PROVIDER_SITE_OTHER): Payer: Medicare Other | Admitting: Family Medicine

## 2020-04-11 ENCOUNTER — Other Ambulatory Visit: Payer: Self-pay

## 2020-04-11 VITALS — BP 108/78 | HR 100 | Temp 97.9°F | Resp 12 | Ht 66.0 in | Wt 197.6 lb

## 2020-04-11 DIAGNOSIS — I10 Essential (primary) hypertension: Secondary | ICD-10-CM

## 2020-04-11 DIAGNOSIS — N390 Urinary tract infection, site not specified: Secondary | ICD-10-CM | POA: Diagnosis not present

## 2020-04-11 DIAGNOSIS — A0472 Enterocolitis due to Clostridium difficile, not specified as recurrent: Secondary | ICD-10-CM

## 2020-04-11 DIAGNOSIS — R739 Hyperglycemia, unspecified: Secondary | ICD-10-CM | POA: Diagnosis not present

## 2020-04-11 MED ORDER — CEFDINIR 300 MG PO CAPS: 300.0000 mg | ORAL_CAPSULE | Freq: Two times a day (BID) | ORAL | 0 refills | Status: AC

## 2020-04-11 MED ORDER — CEFTRIAXONE SODIUM 1 G IJ SOLR
1.0000 g | Freq: Once | INTRAMUSCULAR | Status: AC
  Administered 2020-04-11: 1 g via INTRAMUSCULAR

## 2020-04-11 MED FILL — CEFDINIR 300 MG CAPSULE: 300 | 10 days supply | Qty: 20 | Fill #0

## 2020-04-11 NOTE — Patient Instructions (Signed)
Urinary Tract Infection, Adult A urinary tract infection (UTI) is an infection of any part of the urinary tract. The urinary tract includes:  The kidneys.  The ureters.  The bladder.  The urethra. These organs make, store, and get rid of pee (urine) in the body. What are the causes? This is caused by germs (bacteria) in your genital area. These germs grow and cause swelling (inflammation) of your urinary tract. What increases the risk? You are more likely to develop this condition if:  You have a small, thin tube (catheter) to drain pee.  You cannot control when you pee or poop (incontinence).  You are female, and: ? You use these methods to prevent pregnancy:  A medicine that kills sperm (spermicide).  A device that blocks sperm (diaphragm). ? You have low levels of a female hormone (estrogen). ? You are pregnant.  You have genes that add to your risk.  You are sexually active.  You take antibiotic medicines.  You have trouble peeing because of: ? A prostate that is bigger than normal, if you are female. ? A blockage in the part of your body that drains pee from the bladder (urethra). ? A kidney stone. ? A nerve condition that affects your bladder (neurogenic bladder). ? Not getting enough to drink. ? Not peeing often enough.  You have other conditions, such as: ? Diabetes. ? A weak disease-fighting system (immune system). ? Sickle cell disease. ? Gout. ? Injury of the spine. What are the signs or symptoms? Symptoms of this condition include:  Needing to pee right away (urgently).  Peeing often.  Peeing small amounts often.  Pain or burning when peeing.  Blood in the pee.  Pee that smells bad or not like normal.  Trouble peeing.  Pee that is cloudy.  Fluid coming from the vagina, if you are female.  Pain in the belly or lower back. Other symptoms include:  Throwing up (vomiting).  No urge to eat.  Feeling mixed up (confused).  Being tired  and grouchy (irritable).  A fever.  Watery poop (diarrhea). How is this treated? This condition may be treated with:  Antibiotic medicine.  Other medicines.  Drinking enough water. Follow these instructions at home:  Medicines  Take over-the-counter and prescription medicines only as told by your doctor.  If you were prescribed an antibiotic medicine, take it as told by your doctor. Do not stop taking it even if you start to feel better. General instructions  Make sure you: ? Pee until your bladder is empty. ? Do not hold pee for a long time. ? Empty your bladder after sex. ? Wipe from front to back after pooping if you are a female. Use each tissue one time when you wipe.  Drink enough fluid to keep your pee pale yellow.  Keep all follow-up visits as told by your doctor. This is important. Contact a doctor if:  You do not get better after 1-2 days.  Your symptoms go away and then come back. Get help right away if:  You have very bad back pain.  You have very bad pain in your lower belly.  You have a fever.  You are sick to your stomach (nauseous).  You are throwing up. Summary  A urinary tract infection (UTI) is an infection of any part of the urinary tract.  This condition is caused by germs in your genital area.  There are many risk factors for a UTI. These include having a small, thin   tube to drain pee and not being able to control when you pee or poop.  Treatment includes antibiotic medicines for germs.  Drink enough fluid to keep your pee pale yellow. This information is not intended to replace advice given to you by your health care provider. Make sure you discuss any questions you have with your health care provider. Document Revised: 10/13/2018 Document Reviewed: 05/05/2018 Elsevier Patient Education  2020 [employer] Inc[employer]  

## 2020-04-12 ENCOUNTER — Ambulatory Visit: Payer: Medicare Other | Admitting: Family Medicine

## 2020-04-12 ENCOUNTER — Emergency Department (HOSPITAL_BASED_OUTPATIENT_CLINIC_OR_DEPARTMENT_OTHER)
Admission: EM | Admit: 2020-04-12 | Discharge: 2020-04-13 | Disposition: A | Discharge status: Home / Self Care | Payer: Medicare Other | Attending: Emergency Medicine | Admitting: Emergency Medicine

## 2020-04-12 ENCOUNTER — Emergency Department (HOSPITAL_BASED_OUTPATIENT_CLINIC_OR_DEPARTMENT_OTHER): Payer: Medicare Other

## 2020-04-12 ENCOUNTER — Encounter (HOSPITAL_BASED_OUTPATIENT_CLINIC_OR_DEPARTMENT_OTHER): Payer: Self-pay | Admitting: Emergency Medicine

## 2020-04-12 ENCOUNTER — Other Ambulatory Visit: Payer: Self-pay

## 2020-04-12 DIAGNOSIS — R197 Diarrhea, unspecified: Secondary | ICD-10-CM | POA: Diagnosis not present

## 2020-04-12 DIAGNOSIS — Z885 Allergy status to narcotic agent status: Secondary | ICD-10-CM | POA: Diagnosis not present

## 2020-04-12 DIAGNOSIS — I509 Heart failure, unspecified: Secondary | ICD-10-CM | POA: Insufficient documentation

## 2020-04-12 DIAGNOSIS — R0789 Other chest pain: Secondary | ICD-10-CM | POA: Diagnosis not present

## 2020-04-12 DIAGNOSIS — R079 Chest pain, unspecified: Secondary | ICD-10-CM | POA: Diagnosis not present

## 2020-04-12 DIAGNOSIS — Z7982 Long term (current) use of aspirin: Secondary | ICD-10-CM | POA: Insufficient documentation

## 2020-04-12 DIAGNOSIS — I110 Hypertensive heart disease with heart failure: Secondary | ICD-10-CM | POA: Diagnosis not present

## 2020-04-12 DIAGNOSIS — Z888 Allergy status to other drugs, medicaments and biological substances status: Secondary | ICD-10-CM | POA: Insufficient documentation

## 2020-04-12 DIAGNOSIS — R5383 Other fatigue: Secondary | ICD-10-CM | POA: Insufficient documentation

## 2020-04-12 DIAGNOSIS — Z79899 Other long term (current) drug therapy: Secondary | ICD-10-CM | POA: Diagnosis not present

## 2020-04-12 LAB — CBC WITH DIFFERENTIAL/PLATELET
Abs Immature Granulocytes: 0.02 10*3/uL (ref 0.00–0.07)
Basophils Absolute: 0 10*3/uL (ref 0.0–0.1)
Basophils Relative: 1 %
Eosinophils Absolute: 0.1 10*3/uL (ref 0.0–0.5)
Eosinophils Relative: 2 %
HCT: 39.5 % (ref 36.0–46.0)
Hemoglobin: 12.5 g/dL (ref 12.0–15.0)
Immature Granulocytes: 1 %
Lymphocytes Relative: 28 %
Lymphs Abs: 1.2 10*3/uL (ref 0.7–4.0)
MCH: 28.9 pg (ref 26.0–34.0)
MCHC: 31.6 g/dL (ref 30.0–36.0)
MCV: 91.4 fL (ref 80.0–100.0)
Monocytes Absolute: 0.4 10*3/uL (ref 0.1–1.0)
Monocytes Relative: 8 %
Neutro Abs: 2.6 10*3/uL (ref 1.7–7.7)
Neutrophils Relative %: 60 %
Platelets: 152 10*3/uL (ref 150–400)
RBC: 4.32 MIL/uL (ref 3.87–5.11)
RDW: 13.3 % (ref 11.5–15.5)
WBC: 4.3 10*3/uL (ref 4.0–10.5)
nRBC: 0 % (ref 0.0–0.2)

## 2020-04-12 LAB — COMPREHENSIVE METABOLIC PANEL
ALT: 16 U/L (ref 0–44)
AST: 19 U/L (ref 15–41)
Albumin: 3.4 g/dL — ABNORMAL LOW (ref 3.5–5.0)
Alkaline Phosphatase: 48 U/L (ref 38–126)
Anion gap: 11 (ref 5–15)
BUN: 18 mg/dL (ref 8–23)
CO2: 25 mmol/L (ref 22–32)
Calcium: 8.9 mg/dL (ref 8.9–10.3)
Chloride: 105 mmol/L (ref 98–111)
Creatinine, Ser: 0.61 mg/dL (ref 0.44–1.00)
GFR calc Af Amer: >60 mL/min (ref >60)
GFR calc non Af Amer: >60 mL/min (ref >60)
Glucose, Bld: 111 mg/dL — ABNORMAL HIGH (ref 70–99)
Potassium: 4.1 mmol/L (ref 3.5–5.1)
Sodium: 141 mmol/L (ref 135–145)
Total Bilirubin: 0.5 mg/dL (ref 0.3–1.2)
Total Protein: 7.8 g/dL (ref 6.5–8.1)

## 2020-04-12 LAB — TROPONIN I (HIGH SENSITIVITY)
Troponin I (High Sensitivity): 12 ng/L (ref <18)
Troponin I (High Sensitivity): 13 ng/L (ref <18)

## 2020-04-12 NOTE — ED Notes (Signed)
I was asked to attempt IV insertion on patient. Introduced myself and explained the procedure and patient explained that she would rather wait for an IV until physician ordered something needing an IV. Alerted RN of situation.

## 2020-04-12 NOTE — ED Provider Notes (Signed)
[facility] EMERGENCY DEPARTMENT Provider Note   CSN: [id number] Arrival date & time: 04/12/20  2051     History Chief Complaint  Patient presents with  . Chest Pain    Tami [name] is a 64 y.o. female.  64yo F w/ extensive PMH including CHF, TIA, MM, AICD who p/w multiple complaints including chest pain and malaise. She reports that over the past week she has had intermittent, non-exertional chest tightness, occasionally some shortness of breath but SOB doesn't necessarily correspond with chest tightness. It has come and gone randomly but ~3 hours PTA she began having more persistent chest tightness at rest. No exertional CP or exertional dyspnea. No orthopnea. She has been on several abx recently for UTI and reports feeling tired, she states "I feel like crap." No fevers, cough, or vomiting. She has had some diarrhea which may be related to abx. She also requests that I check her ears.   The history is provided by the patient.  Chest Pain      Past Medical History:  Diagnosis Date  . Abnormal SPEP 07/26/2016  . Arthritis    knees, hands  . Back pain 07/14/2016  . Bowel obstruction ([place]) 11/2014  . C. difficile diarrhea   . CHF (congestive heart failure) ([place])   . Colitis   . Congestive heart failure ([place]) 02/24/2015   S/p pacemaker  . Depression   . Elevated sed rate 08/15/2017  . Epigastric pain 03/22/2017  . GERD (gastroesophageal reflux disease)   . Heart disease   . Hyperlipidemia   . Hypertension   . 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 Low back pain 07/14/2016  . 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 Multiple myeloma ([place]) 07/20/2018  . Multiple myeloma not having achieved remission ([place]) 07/20/2018  . Myalgia 12/31/2016  . Obstructive sleep apnea 03/31/2015  . SOB (shortness of breath) 04/09/2016  . Stroke ([place])    TIAs  . UTI (urinary tract infection)     Patient Active Problem List   Diagnosis Date Noted  . Low magnesium  level 03/04/2020  . Thrombocytopenia ([place]) 03/04/2020  . Dysphagia 03/04/2020  . Constipation 03/04/2020  . Sensation of pressure in bladder area 01/17/2020  . Hyperglycemia 01/17/2020  . Peripheral neuropathy 11/15/2019  . Chronic migraine without aura, with intractable migraine, so stated, with status migrainosus 10/17/2019  . ETD (Eustachian tube dysfunction), bilateral 09/12/2019  . Urinary frequency 07/17/2019  . Headache 07/17/2019  . Palpitation 05/15/2019  . Urticaria 02/22/2019  . Conjunctivitis 10/17/2018  . Diarrhea 09/04/2018  . Thyroid nodule 08/26/2018  . Drug rash 08/26/2018  . Multiple myeloma ([place]) 07/20/2018  . Multiple myeloma not having achieved remission ([place]) 07/20/2018  . Hematuria 07/11/2018  . Seasonal allergies 06/23/2018  . Right shoulder pain 06/07/2018  . Neck pain 06/07/2018  . Paresthesias 06/07/2018  . 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 Pain in thoracic spine 03/18/2018  . Thoracic back pain 02/15/2018  . 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 Cystitis 10/25/2017  . C. difficile colitis 09/15/2017  . Hypokalemia 08/20/2017  . Anemia 08/20/2017  . Elevated sed rate 08/15/2017  . 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 Epigastric pain 03/22/2017  . Myalgia 12/31/2016  . SOB (shortness of breath) 07/26/2016  . Abnormal SPEP 07/26/2016  . Low back pain 07/14/2016  . Insomnia 12/29/2015  . Tension headache 10/21/2015  . Muscle spasms of neck 10/18/2015  . Dysuria 10/13/2015  . Sinusitis 07/07/2015  .  AP (abdominal pain) 07/07/2015  . Cardiomyopathy ([place]) 04/19/2015  . Chest wall pain 04/19/2015  . Obstructive sleep apnea 03/31/2015  . Congestive heart failure ([place]) 02/24/2015  . Arthritis   . Hyperlipidemia   . Hypertension   . Depression   . Stroke ([place])   . GERD (gastroesophageal reflux disease)   . Bowel obstruction ([place]) 11/09/2014    Past Surgical History:  Procedure Laterality Date  .  ABDOMINAL HYSTERECTOMY     menorraghia, 2006, total  . CHOLECYSTECTOMY    . COLONOSCOPY  2018   [facility] per patient  . ESOPHAGOGASTRODUODENOSCOPY  2018   [facility]   . KNEE SURGERY     right, repair torn torn cartialage  . PACEMAKER INSERTION  08/2014  . TONSILLECTOMY    . TUBAL LIGATION       OB History   No obstetric history on file.     Family History  Problem Relation Age of Onset  . Diabetes Mother   . Hypertension Mother   . Heart disease Mother        s/p 1 stent  . Hyperlipidemia Mother   . Arthritis Mother   . Cancer Father        COLON  . Colon cancer Father 16  . Irritable bowel syndrome Sister   . Hyperlipidemia Daughter   . Hypertension Daughter   . Hypertension Maternal Grandmother   . Arthritis Maternal Grandmother   . Heart disease Maternal Grandfather        MI  . Hypertension Maternal Grandfather   . Arthritis Maternal Grandfather   . Hypertension Son   . Esophageal cancer Neg Hx     Social History   Tobacco Use  . Smoking status: Never Smoker  . Smokeless tobacco: Never Used  Substance Use Topics  . Alcohol use: No  . Drug use: No    Home Medications Prior to Admission medications   Medication Sig Start Date End Date Taking? Authorizing Provider  acetaminophen (TYLENOL) 325 MG tablet Take 650 mg by mouth. 06/07/17   [provider]  ACETAMINOPHEN-BUTALBITAL 50-325 MG TABS Take 1 tablet by mouth 2 (two) times daily as needed. 07/13/19   Mosie Lukes, MD  albuterol (PROVENTIL HFA;VENTOLIN HFA) 108 (90 Base) MCG/ACT inhaler Inhale 2 puffs into the lungs every 6 (six) hours as needed for wheezing or shortness of breath. 08/18/18   Saguier, Percell Miller, PA-C  amLODipine (NORVASC) 10 MG tablet Take 1 tablet (10 mg total) by mouth 2 (two) times daily. 01/16/20   Mosie Lukes, MD  aspirin EC 81 MG tablet Take 1 tablet (81 mg total) by mouth daily. 02/15/18   Mosie Lukes, MD  carvedilol (COREG) 25 MG tablet Take  1 tablet (25 mg total) by mouth 2 (two) times daily with a meal. 07/13/19   Mosie Lukes, MD  cefdinir (OMNICEF) 300 MG capsule Take 1 capsule (300 mg total) by mouth 2 (two) times daily for 10 days. 04/11/20 04/21/20  Mosie Lukes, MD  cetirizine (ZYRTEC) 10 MG tablet Take 10 mg by mouth daily. 01/22/20   [provider]  Cholecalciferol (VITAMIN D-3) 25 MCG (1000 UT) CAPS Take by mouth.    [provider]  conjugated estrogens (PREMARIN) vaginal cream Place vaginally. 01/31/14   [provider]  cyclobenzaprine (FLEXERIL) 10 MG tablet Take 1 tablet (10 mg total) by mouth at bedtime. 02/19/20   Saguier, Percell Miller, PA-C  fluticasone (FLONASE) 50 MCG/ACT nasal spray Place 2 sprays  into both nostrils daily. 01/07/20   Joy, Shawn C, PA-C  furosemide (LASIX) 20 MG tablet Take 1 tablet (20 mg total) by mouth daily as needed for fluid or edema. 07/13/19   Mosie Lukes, MD  hydrOXYzine (ATARAX/VISTARIL) 10 MG tablet Take 1 tablet (10 mg total) by mouth 3 (three) times daily as needed for itching. 04/04/20   Saguier, Percell Miller, PA-C  magnesium oxide (MAG-OX) 400 MG tablet Take 1 tablet (400 mg total) by mouth 2 (two) times daily. 03/04/20   Mosie Lukes, MD  meclizine (ANTIVERT) 12.5 MG tablet Take 12.5 mg by mouth 3 (three) times daily. 02/18/20   [provider]  metoCLOPramide (REGLAN) 10 MG tablet Take 1 tablet (10 mg total) by mouth 4 (four) times daily. For migraine or headache or nausea 08/30/19   Melvenia Beam, MD  montelukast (SINGULAIR) 10 MG tablet Take 1 tablet (10 mg total) by mouth at bedtime. Start taking Singulair on 11/16/2019. 10/31/19   [name], MD  Multiple Vitamins-Minerals (MULTIVITAMIN ADULT PO) Take by mouth.    [provider]  Naproxen 375 MG TBEC Take 1 tablet (375 mg total) by mouth every morning. 02/26/20   Mosie Lukes, MD  ondansetron (ZOFRAN) 4 MG tablet Take 1 tablet (4 mg total) by mouth every 8 (eight) hours as needed for  nausea or vomiting. 04/14/18   Mosie Lukes, MD  pantoprazole (PROTONIX) 40 MG tablet Take 1 tablet (40 mg total) by mouth 2 (two) times daily. 02/15/18   Mosie Lukes, MD  potassium chloride SA (KLOR-CON) 20 MEQ tablet Take 1 tablet (20 mEq total) by mouth daily. 03/05/20   Mosie Lukes, MD  prochlorperazine (COMPAZINE) 10 MG tablet Take 1 tablet (10 mg total) by mouth every 6 (six) hours as needed (Nausea or vomiting). 12/05/19   [name], MD  promethazine (PHENERGAN) 25 MG tablet Take 1 tablet (25 mg total) by mouth every 8 (eight) hours as needed for nausea or vomiting. 09/11/19   Mosie Lukes, MD  Rimegepant Sulfate (NURTEC) 75 MG TBDP Take 75 mg by mouth daily as needed. For migraines. Take as close to onset of migraine as possible. One daily maximum. 10/17/19   Melvenia Beam, MD  topiramate (TOPAMAX) 50 MG tablet Take 1 tablet (50 mg total) by mouth 2 (two) times daily. 07/13/19   Mosie Lukes, MD  triamterene-hydrochlorothiazide (MAXZIDE-25) 37.5-25 MG tablet Take 1 tablet by mouth daily. 09/19/19   [provider]    Allergies    Benazepril, Ondansetron hcl, Codeine, and Morphine  Review of Systems   Review of Systems  Cardiovascular: Positive for chest pain.   All other systems reviewed and are negative except that which was mentioned in HPI  Physical Exam Updated Vital Signs BP (!) 148/101 (BP Location: Right Arm)   Pulse 99   Temp 98.5 F (36.9 C) (Oral)   Resp 16   Ht _0  (1.676 m)   Wt 87.1 kg   SpO2 95%   BMI 30.99 kg/m   Physical Exam Vitals and nursing note reviewed.  Constitutional:      General: She is not in acute distress.    Appearance: She is well-developed.  HENT:     Head: Normocephalic and atraumatic.     Right Ear: Tympanic membrane and ear canal normal.     Left Ear: Tympanic membrane and ear canal normal.  Eyes:     Conjunctiva/sclera: Conjunctivae normal.  Cardiovascular:  Rate and Rhythm: Normal rate and regular  rhythm.     Heart sounds: Normal heart sounds. No murmur.  Pulmonary:     Effort: Pulmonary effort is normal.     Breath sounds: Normal breath sounds.  Abdominal:     General: Bowel sounds are normal. There is no distension.     Palpations: Abdomen is soft.     Tenderness: There is no abdominal tenderness.  Musculoskeletal:     Cervical back: Neck supple.     Right lower leg: No edema.     Left lower leg: No edema.  Skin:    General: Skin is warm and dry.  Neurological:     Mental Status: She is alert and oriented to person, place, and time.     Comments: Fluent speech  Psychiatric:        Judgment: Judgment normal.     ED Results / Procedures / Treatments   Labs (all labs ordered are listed, but only abnormal results are displayed) Labs Reviewed  COMPREHENSIVE METABOLIC PANEL - Abnormal; Notable for the following components:      Result Value   Glucose, Bld 111 (*)    Albumin 3.4 (*)    All other components within normal limits  CBC WITH DIFFERENTIAL/PLATELET  TROPONIN I (HIGH SENSITIVITY)  TROPONIN I (HIGH SENSITIVITY)    EKG EKG Interpretation  Date/Time:  Friday April 12 2020 21:03:40 EDT Ventricular Rate:  100 PR Interval:    QRS Duration: 159 QT Interval:  384 QTC Calculation: 496 R Axis:   7 Text Interpretation: Sinus tachycardia Ventricular trigeminy Left atrial enlargement Left bundle branch block LBBB complete compared to old tracing, ventricular trigeminy new Confirmed by Theotis Burrow [phone]) on 04/12/2020 10:31:19 PM   Radiology DG Chest 2 View  Result Date: 04/12/2020 CLINICAL DATA:  Chest pain x3 hours. EXAM: CHEST - 2 VIEW COMPARISON:  September 08, 2019 FINDINGS: There is a dual lead AICD. There is no evidence of acute infiltrate, pleural effusion or pneumothorax. The heart size and mediastinal contours are within normal limits. The visualized skeletal structures are unremarkable. IMPRESSION: No active cardiopulmonary disease. Electronically Signed    By: Virgina Norfolk M.D.   On: 04/12/2020 22:07    Procedures Procedures (including critical care time)  Medications Ordered in ED Medications - No data to display  ED Course  I have reviewed the triage vital signs and the nursing notes.  Pertinent labs & imaging results that were available during my care of the patient were reviewed by me and considered in my medical decision making (see chart for details).    MDM Rules/Calculators/A&P                      Nontoxic on exam, mildly hypertensive but otherwise reassuring vital signs.  Lung sounds are clear, no evidence of volume overload on exam, and chest x-ray is clear.  Lab work shows normal CMP and CBC.  Serial troponins are negative.  EKG shows left bundle branch block, patient had conduction delay on old tracing and she has no ischemic changes today.  She has no evidence of acute pulmonary process and work-up and exam are reassuring against CHF exacerbation.  Given that her symptoms are coming and going randomly while sitting still and have been going on for a week with normal cardiac work-up here, I highly doubt ACS, furthermore, she endorses multiple other concurrent symptoms that suggest noncardiac etiology.  I ensured that she is on an appropriate  antibiotic, Omnicef, based on her recent urine culture results showing susceptibility to cephalosporins.  Although she does have extensive cardiac history, I feel that her symptoms are very atypical for ACS and I am reassured by her normal work-up here.  Furthermore, her cardiology clinic note from April of this year documents many of the same symptoms that she described to me and at that time they felt symptoms were unlikely to be cardiac in nature as well.  I have recommended that she follow-up with her cardiologist and I have extensively reviewed return precautions.  She voiced understanding. Final Clinical Impression(s) / ED Diagnoses Final diagnoses:  None    Rx / DC Orders ED  Discharge Orders    None       Knox Holdman, Wenda Overland, MD 04/12/20 2348

## 2020-04-12 NOTE — ED Triage Notes (Signed)
Patient arrived via POV c/o chest pain x 3 hrs. Patient states recently dx of UTI. Patient states fatigue. Patient chest pain in upper left chest. Patient is AO x 4, elevated HR/BP, slow gait.

## 2020-04-13 DIAGNOSIS — N3000 Acute cystitis without hematuria: Secondary | ICD-10-CM | POA: Diagnosis not present

## 2020-04-13 DIAGNOSIS — R519 Headache, unspecified: Secondary | ICD-10-CM | POA: Diagnosis not present

## 2020-04-14 DIAGNOSIS — N390 Urinary tract infection, site not specified: Secondary | ICD-10-CM | POA: Insufficient documentation

## 2020-04-14 NOTE — Assessment & Plan Note (Signed)
Diarrhea has improved a good deal but still with some intermittent abdominal discomfort. She will maintain adequate hydration and a bland diet and report if worsens.

## 2020-04-14 NOTE — Assessment & Plan Note (Addendum)
Started on cefdinir, but first given a shot of Rocephin, probiotics and cranberry tabs increase water intake.

## 2020-04-14 NOTE — Progress Notes (Signed)
Subjective:    [name], female    DOB: 07-29-1956, 64 y.o.   MRN: [id number]  Chief Complaint  Patient presents with  . Headache    follow up  . Fatigue    HPI Patient is in today for follow up on chronic medical concerns. She is not feeling well still. She has recently been treated for UTI and Cdiff but still feels fatigued, malaise, mild abdominal and low back pain increases. She is frustrated  By how worn out she still feels. Her dysuria is improved. Denies CP/palp/SOB/HA/congestion/fevers/GI c/o. Taking meds as prescribed  Past Medical History:  Diagnosis Date  . Abnormal SPEP 07/26/2016  . Arthritis    knees, hands  . Back pain 07/14/2016  . Bowel obstruction ([place]) 11/2014  . C. difficile diarrhea   . CHF (congestive heart failure) ([place])   . Colitis   . Congestive heart failure ([place]) 02/24/2015   S/p pacemaker  . Depression   . Elevated sed rate 08/15/2017  . Epigastric pain 03/22/2017  . GERD (gastroesophageal reflux disease)   . Heart disease   . Hyperlipidemia   . Hypertension   . 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 Low back pain 07/14/2016  . 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 Multiple myeloma ([place]) 07/20/2018  . Multiple myeloma not having achieved remission ([place]) 07/20/2018  . Myalgia 12/31/2016  . Obstructive sleep apnea 03/31/2015  . SOB (shortness of breath) 04/09/2016  . Stroke ([place])    TIAs  . UTI (urinary tract infection)     Past Surgical History:  Procedure Laterality Date  . ABDOMINAL HYSTERECTOMY     menorraghia, 2006, total  . CHOLECYSTECTOMY    . COLONOSCOPY  2018   [facility] per patient  . ESOPHAGOGASTRODUODENOSCOPY  2018   [facility]   . KNEE SURGERY     right, repair torn torn cartialage  . PACEMAKER INSERTION  08/2014  . TONSILLECTOMY    . TUBAL LIGATION      Family History  Problem Relation Age of Onset  . Diabetes Mother   . Hypertension Mother   . Heart  disease Mother        s/p 1 stent  . Hyperlipidemia Mother   . Arthritis Mother   . Cancer Father        COLON  . Colon cancer Father 86  . Irritable bowel syndrome Sister   . Hyperlipidemia Daughter   . Hypertension Daughter   . Hypertension Maternal Grandmother   . Arthritis Maternal Grandmother   . Heart disease Maternal Grandfather        MI  . Hypertension Maternal Grandfather   . Arthritis Maternal Grandfather   . Hypertension Son   . Esophageal cancer Neg Hx     Social History   Socioeconomic History  . Marital status: Divorced    Spouse name: [name]  . Number of children: 3  . Years of education: [name]  . Highest education level: High school graduate  Occupational History  . [name]  Tobacco Use  . Smoking status: Never Smoker  . Smokeless tobacco: Never Used  Substance and Sexual Activity  . Alcohol use: No  . Drug use: No  . Sexual activity: [name]  Other Topics Concern  . [name]  Social History Narrative   Lives at home alone   Retired   Caffeine: coffee   Social Determinants of Radio broadcast assistant  Strain:   . Difficulty of Paying Living Expenses:   Food Insecurity:   . Worried About Charity fundraiser in the Last Year:   . Arboriculturist in the Last Year:   Transportation Needs:   . Film/video editor (Medical):   Marland Kitchen Lack of Transportation (Non-Medical):   Physical Activity:   . Days of Exercise per Week:   . Minutes of Exercise per Session:   Stress:   . Feeling of Stress :   Social Connections:   . Frequency of Communication with Friends and Family:   . Frequency of Social Gatherings with Friends and Family:   . Attends Religious Services:   . Active Member of Clubs or Organizations:   . Attends Archivist Meetings:   Marland Kitchen Marital Status:   Intimate Partner Violence:   . Fear of Current or Ex-Partner:   . Emotionally Abused:   Marland Kitchen Physically Abused:   . Sexually Abused:     Outpatient  Medications Prior to Visit  Medication Sig Dispense Refill  . acetaminophen (TYLENOL) 325 MG tablet Take 650 mg by mouth.    . ACETAMINOPHEN-BUTALBITAL 50-325 MG TABS Take 1 tablet by mouth 2 (two) times daily as needed. 120 tablet 1  . albuterol (PROVENTIL HFA;VENTOLIN HFA) 108 (90 Base) MCG/ACT inhaler Inhale 2 puffs into the lungs every 6 (six) hours as needed for wheezing or shortness of breath. 1 Inhaler 2  . amLODipine (NORVASC) 10 MG tablet Take 1 tablet (10 mg total) by mouth 2 (two) times daily. 180 tablet 1  . aspirin EC 81 MG tablet Take 1 tablet (81 mg total) by mouth daily. 90 tablet 1  . carvedilol (COREG) 25 MG tablet Take 1 tablet (25 mg total) by mouth 2 (two) times daily with a meal. 180 tablet 1  . cetirizine (ZYRTEC) 10 MG tablet Take 10 mg by mouth daily.    . Cholecalciferol (VITAMIN D-3) 25 MCG (1000 UT) CAPS Take by mouth.    . conjugated estrogens (PREMARIN) vaginal cream Place vaginally.    . cyclobenzaprine (FLEXERIL) 10 MG tablet Take 1 tablet (10 mg total) by mouth at bedtime. 7 tablet 0  . fluticasone (FLONASE) 50 MCG/ACT nasal spray Place 2 sprays into both nostrils daily. 16 g 0  . furosemide (LASIX) 20 MG tablet Take 1 tablet (20 mg total) by mouth daily as needed for fluid or edema. 30 tablet 2  . hydrOXYzine (ATARAX/VISTARIL) 10 MG tablet Take 1 tablet (10 mg total) by mouth 3 (three) times daily as needed for itching. 30 tablet 0  . magnesium oxide (MAG-OX) 400 MG tablet Take 1 tablet (400 mg total) by mouth 2 (two) times daily. 60 tablet 1  . meclizine (ANTIVERT) 12.5 MG tablet Take 12.5 mg by mouth 3 (three) times daily.    . metoCLOPramide (REGLAN) 10 MG tablet Take 1 tablet (10 mg total) by mouth 4 (four) times daily. For migraine or headache or nausea 30 tablet 6  . montelukast (SINGULAIR) 10 MG tablet Take 1 tablet (10 mg total) by mouth at bedtime. Start taking Singulair on 11/16/2019. 20 tablet 0  . Multiple Vitamins-Minerals (MULTIVITAMIN ADULT PO) Take  by mouth.    . Naproxen 375 MG TBEC Take 1 tablet (375 mg total) by mouth every morning. 30 tablet 2  . ondansetron (ZOFRAN) 4 MG tablet Take 1 tablet (4 mg total) by mouth every 8 (eight) hours as needed for nausea or vomiting. 30 tablet 1  . pantoprazole (  PROTONIX) 40 MG tablet Take 1 tablet (40 mg total) by mouth 2 (two) times daily. 180 tablet 1  . potassium chloride SA (KLOR-CON) 20 MEQ tablet Take 1 tablet (20 mEq total) by mouth daily. 30 tablet 3  . prochlorperazine (COMPAZINE) 10 MG tablet Take 1 tablet (10 mg total) by mouth every 6 (six) hours as needed (Nausea or vomiting). 30 tablet 1  . promethazine (PHENERGAN) 25 MG tablet Take 1 tablet (25 mg total) by mouth every 8 (eight) hours as needed for nausea or vomiting. 30 tablet 2  . Rimegepant Sulfate (NURTEC) 75 MG TBDP Take 75 mg by mouth daily as needed. For migraines. Take as close to onset of migraine as possible. One daily maximum. 10 tablet 6  . topiramate (TOPAMAX) 50 MG tablet Take 1 tablet (50 mg total) by mouth 2 (two) times daily. 60 tablet 3  . triamterene-hydrochlorothiazide (MAXZIDE-25) 37.5-25 MG tablet Take 1 tablet by mouth daily.    Marland Kitchen amoxicillin-clavulanate (AUGMENTIN) 875-125 MG tablet Take 1 tablet by mouth 2 (two) times daily. 20 tablet 0  . predniSONE (STERAPRED UNI-PAK 21 TAB) 10 MG (21) TBPK tablet Standard Taper over 6 days. 21 tablet 0   No facility-administered medications prior to visit.    Allergies  Allergen Reactions  . Benazepril Anaphylaxis and Swelling    angioedema Throat and lip swelling  . Ondansetron Hcl Hives    Redness and hives post IV admin on 07/05/17  . Codeine Nausea And Vomiting  . Morphine Hives    Redness and hives noted post IV admin on 07/05/17    Review of Systems  Constitutional: Positive for malaise/fatigue. Negative for fever.  HENT: Negative for congestion.   Eyes: Negative for blurred vision.  Respiratory: Negative for shortness of breath.   Cardiovascular: Negative  for chest pain, palpitations and leg swelling.  Gastrointestinal: Positive for abdominal pain. Negative for blood in stool and nausea.  Genitourinary: Positive for frequency. Negative for dysuria.  Musculoskeletal: Positive for myalgias. Negative for falls.  Skin: Negative for rash.  Neurological: Negative for dizziness, loss of consciousness and headaches.  Endo/Heme/Allergies: Negative for environmental allergies.  Psychiatric/Behavioral: Negative for depression. The patient is not nervous/anxious.        Objective:    Physical Exam  BP 108/78 (BP Location: Right Arm, Cuff Size: Large)   Pulse 100   Temp 97.9 F (36.6 C) (Temporal)   Resp 12   Ht '5\' 6"'$  (1.676 m)   Wt 197 lb 9.6 oz (89.6 kg)   SpO2 96%   BMI 31.89 kg/m  Wt Readings from Last 3 Encounters:  04/12/20 192 lb (87.1 kg)  04/11/20 197 lb 9.6 oz (89.6 kg)  04/04/20 196 lb (88.9 kg)    Diabetic Foot Exam - Simple   No data filed     Lab Results  Component Value Date   WBC 4.3 04/12/2020   HGB 12.5 04/12/2020   HCT 39.5 04/12/2020   PLT 152 04/12/2020   GLUCOSE 111 (H) 04/12/2020   CHOL 193 01/16/2020   TRIG 120.0 01/16/2020   HDL 52.90 01/16/2020   LDLCALC 116 (H) 01/16/2020   ALT 16 04/12/2020   AST 19 04/12/2020   NA 141 04/12/2020   K 4.1 04/12/2020   CL 105 04/12/2020   CREATININE 0.61 04/12/2020   BUN 18 04/12/2020   CO2 25 04/12/2020   TSH 1.29 01/16/2020   INR 0.90 07/28/2018   HGBA1C 5.9 01/16/2020    Lab Results  Component Value  Date   TSH 1.29 01/16/2020   Lab Results  Component Value Date   WBC 4.3 04/12/2020   HGB 12.5 04/12/2020   HCT 39.5 04/12/2020   MCV 91.4 04/12/2020   PLT 152 04/12/2020   Lab Results  Component Value Date   NA 141 04/12/2020   K 4.1 04/12/2020   CHLORIDE 108 12/31/2016   CO2 25 04/12/2020   GLUCOSE 111 (H) 04/12/2020   BUN 18 04/12/2020   CREATININE 0.61 04/12/2020   BILITOT 0.5 04/12/2020   ALKPHOS 48 04/12/2020   AST 19 04/12/2020   ALT  16 04/12/2020   PROT 7.8 04/12/2020   ALBUMIN 3.4 (L) 04/12/2020   CALCIUM 8.9 04/12/2020   ANIONGAP 11 04/12/2020   EGFR >90 12/31/2016   GFR 125.91 04/04/2020   Lab Results  Component Value Date   CHOL 193 01/16/2020   Lab Results  Component Value Date   HDL 52.90 01/16/2020   Lab Results  Component Value Date   LDLCALC 116 (H) 01/16/2020   Lab Results  Component Value Date   TRIG 120.0 01/16/2020   Lab Results  Component Value Date   CHOLHDL 4 01/16/2020   Lab Results  Component Value Date   HGBA1C 5.9 01/16/2020       Assessment & Plan:   Problem List Items Addressed This Visit    Hypertension    Well controlled, no changes to meds. Encouraged heart healthy diet such as the DASH diet and exercise as tolerated.       C. difficile colitis    Diarrhea has improved a good deal but still with some intermittent abdominal discomfort. She will maintain adequate hydration and a bland diet and report if worsens.      Relevant Medications   cefdinir (OMNICEF) 300 MG capsule   Hyperglycemia    hgba1c acceptable, minimize simple carbs. Increase exercise as tolerated.       Urinary tract infection without hematuria - Primary    Started on cefdinir, but first given a shot of Rocephin, probiotics and cranberry tabs increase water intake.       Relevant Medications   cefdinir (OMNICEF) 300 MG capsule      I have discontinued Lannette Donath predniSONE and amoxicillin-clavulanate. I am also having her start on cefdinir. Additionally, I am having her maintain her conjugated estrogens, acetaminophen, pantoprazole, aspirin EC, ondansetron, albuterol, Multiple Vitamins-Minerals (MULTIVITAMIN ADULT PO), carvedilol, topiramate, ACETAMINOPHEN-BUTALBITAL, furosemide, metoCLOPramide, promethazine, triamterene-hydrochlorothiazide, Nurtec, montelukast, Vitamin D-3, prochlorperazine, fluticasone, amLODipine, cetirizine, cyclobenzaprine, meclizine, Naproxen, magnesium oxide,  potassium chloride SA, and hydrOXYzine. We administered cefTRIAXone.  Meds ordered this encounter  Medications  . cefdinir (OMNICEF) 300 MG capsule    Sig: Take 1 capsule (300 mg total) by mouth 2 (two) times daily for 10 days.    Dispense:  20 capsule    Refill:  0  . cefTRIAXone (ROCEPHIN) injection 1 g     Penni Homans, MD

## 2020-04-14 NOTE — Assessment & Plan Note (Signed)
Well controlled, no changes to meds. Encouraged heart healthy diet such as the DASH diet and exercise as tolerated.  °

## 2020-04-14 NOTE — Assessment & Plan Note (Signed)
hgba1c acceptable, minimize simple carbs. Increase exercise as tolerated.  

## 2020-04-15 DIAGNOSIS — R42 Dizziness and giddiness: Secondary | ICD-10-CM | POA: Diagnosis not present

## 2020-04-15 DIAGNOSIS — Z862 Personal history of diseases of the blood and blood-forming organs and certain disorders involving the immune mechanism: Secondary | ICD-10-CM | POA: Diagnosis not present

## 2020-04-15 DIAGNOSIS — M6281 Muscle weakness (generalized): Secondary | ICD-10-CM | POA: Diagnosis not present

## 2020-04-15 DIAGNOSIS — I447 Left bundle-branch block, unspecified: Secondary | ICD-10-CM | POA: Diagnosis not present

## 2020-04-15 DIAGNOSIS — R519 Headache, unspecified: Secondary | ICD-10-CM | POA: Diagnosis not present

## 2020-04-16 DIAGNOSIS — N3000 Acute cystitis without hematuria: Secondary | ICD-10-CM | POA: Diagnosis not present

## 2020-04-16 DIAGNOSIS — I447 Left bundle-branch block, unspecified: Secondary | ICD-10-CM | POA: Diagnosis not present

## 2020-04-17 DIAGNOSIS — H35712 Central serous chorioretinopathy, left eye: Secondary | ICD-10-CM | POA: Diagnosis not present

## 2020-04-19 DIAGNOSIS — R519 Headache, unspecified: Secondary | ICD-10-CM | POA: Diagnosis not present

## 2020-04-22 ENCOUNTER — Other Ambulatory Visit: Payer: Self-pay

## 2020-04-22 ENCOUNTER — Other Ambulatory Visit: Payer: Self-pay | Admitting: *Deleted

## 2020-04-22 ENCOUNTER — Inpatient Hospital Stay: Payer: Medicare Other | Attending: Hematology & Oncology

## 2020-04-22 DIAGNOSIS — R42 Dizziness and giddiness: Secondary | ICD-10-CM | POA: Diagnosis not present

## 2020-04-22 DIAGNOSIS — R5382 Chronic fatigue, unspecified: Secondary | ICD-10-CM | POA: Diagnosis not present

## 2020-04-22 DIAGNOSIS — D509 Iron deficiency anemia, unspecified: Secondary | ICD-10-CM

## 2020-04-22 DIAGNOSIS — R6889 Other general symptoms and signs: Secondary | ICD-10-CM | POA: Diagnosis not present

## 2020-04-22 DIAGNOSIS — Z950 Presence of cardiac pacemaker: Secondary | ICD-10-CM | POA: Diagnosis not present

## 2020-04-22 DIAGNOSIS — I447 Left bundle-branch block, unspecified: Secondary | ICD-10-CM | POA: Diagnosis not present

## 2020-04-22 DIAGNOSIS — C9000 Multiple myeloma not having achieved remission: Secondary | ICD-10-CM | POA: Diagnosis not present

## 2020-04-22 DIAGNOSIS — R110 Nausea: Secondary | ICD-10-CM | POA: Diagnosis not present

## 2020-04-22 DIAGNOSIS — R519 Headache, unspecified: Secondary | ICD-10-CM | POA: Diagnosis not present

## 2020-04-22 LAB — CBC WITH DIFFERENTIAL (CANCER CENTER ONLY)
Band Neutrophils: 0 %
Basophils Absolute: 0.1 10*3/uL (ref 0.0–0.1)
Basophils Relative: 1 %
Eosinophils Absolute: 0.1 10*3/uL (ref 0.0–0.5)
Eosinophils Relative: 2 %
HCT: 38.9 % (ref 36.0–46.0)
Hemoglobin: 12.4 g/dL (ref 12.0–15.0)
Lymphocytes Relative: 31 %
Lymphs Abs: 1.4 10*3/uL (ref 0.7–4.0)
MCH: 28.9 pg (ref 26.0–34.0)
MCHC: 31.9 g/dL (ref 30.0–36.0)
MCV: 90.7 fL (ref 80.0–100.0)
Monocytes Absolute: 0.4 10*3/uL (ref 0.1–1.0)
Monocytes Relative: 8 %
Neutro Abs: 2.7 10*3/uL (ref 1.7–7.7)
Neutrophils Relative %: 59 %
Platelet Count: 144 10*3/uL — ABNORMAL LOW (ref 150–400)
RBC: 4.29 MIL/uL (ref 3.87–5.11)
RDW: 13.6 % (ref 11.5–15.5)
WBC Count: 4.6 10*3/uL (ref 4.0–10.5)
nRBC: 0 % (ref 0.0–0.2)

## 2020-04-22 LAB — IRON AND TIBC
Iron: 67 ug/dL (ref 41–142)
Saturation Ratios: 18 % — ABNORMAL LOW (ref 21–57)
TIBC: 369 ug/dL (ref 236–444)
UIBC: 302 ug/dL (ref 120–384)

## 2020-04-22 LAB — COMPREHENSIVE METABOLIC PANEL
ALT: 11 U/L (ref 0–44)
AST: 15 U/L (ref 15–41)
Albumin: 3.6 g/dL (ref 3.5–5.0)
Alkaline Phosphatase: 39 U/L (ref 38–126)
Anion gap: 6 (ref 5–15)
BUN: 18 mg/dL (ref 8–23)
CO2: 25 mmol/L (ref 22–32)
Calcium: 9.1 mg/dL (ref 8.9–10.3)
Chloride: 111 mmol/L (ref 98–111)
Creatinine, Ser: 0.81 mg/dL (ref 0.44–1.00)
GFR calc Af Amer: >60 mL/min (ref >60)
GFR calc non Af Amer: >60 mL/min (ref >60)
Glucose, Bld: 100 mg/dL — ABNORMAL HIGH (ref 70–99)
Potassium: 4 mmol/L (ref 3.5–5.1)
Sodium: 142 mmol/L (ref 135–145)
Total Bilirubin: 0.3 mg/dL (ref 0.3–1.2)
Total Protein: 7.2 g/dL (ref 6.5–8.1)

## 2020-04-22 LAB — FERRITIN: Ferritin: 81 ng/mL (ref 11–307)

## 2020-04-24 DIAGNOSIS — I110 Hypertensive heart disease with heart failure: Secondary | ICD-10-CM | POA: Diagnosis not present

## 2020-04-24 DIAGNOSIS — Z79899 Other long term (current) drug therapy: Secondary | ICD-10-CM | POA: Diagnosis not present

## 2020-04-24 DIAGNOSIS — R531 Weakness: Secondary | ICD-10-CM | POA: Diagnosis not present

## 2020-04-24 DIAGNOSIS — I420 Dilated cardiomyopathy: Secondary | ICD-10-CM | POA: Diagnosis not present

## 2020-04-24 DIAGNOSIS — G4733 Obstructive sleep apnea (adult) (pediatric): Secondary | ICD-10-CM | POA: Diagnosis not present

## 2020-04-24 DIAGNOSIS — G629 Polyneuropathy, unspecified: Secondary | ICD-10-CM | POA: Diagnosis not present

## 2020-04-24 DIAGNOSIS — I5020 Unspecified systolic (congestive) heart failure: Secondary | ICD-10-CM | POA: Diagnosis not present

## 2020-04-24 DIAGNOSIS — E876 Hypokalemia: Secondary | ICD-10-CM | POA: Diagnosis not present

## 2020-04-24 DIAGNOSIS — G4700 Insomnia, unspecified: Secondary | ICD-10-CM | POA: Diagnosis not present

## 2020-04-24 DIAGNOSIS — Z8249 Family history of ischemic heart disease and other diseases of the circulatory system: Secondary | ICD-10-CM | POA: Diagnosis not present

## 2020-04-24 DIAGNOSIS — J301 Allergic rhinitis due to pollen: Secondary | ICD-10-CM | POA: Diagnosis not present

## 2020-04-24 DIAGNOSIS — J984 Other disorders of lung: Secondary | ICD-10-CM | POA: Diagnosis not present

## 2020-04-24 DIAGNOSIS — I10 Essential (primary) hypertension: Secondary | ICD-10-CM | POA: Diagnosis not present

## 2020-04-24 DIAGNOSIS — I472 Ventricular tachycardia: Secondary | ICD-10-CM | POA: Diagnosis not present

## 2020-04-24 DIAGNOSIS — C9000 Multiple myeloma not having achieved remission: Secondary | ICD-10-CM | POA: Diagnosis not present

## 2020-04-24 DIAGNOSIS — I499 Cardiac arrhythmia, unspecified: Secondary | ICD-10-CM | POA: Diagnosis not present

## 2020-04-24 DIAGNOSIS — Z950 Presence of cardiac pacemaker: Secondary | ICD-10-CM | POA: Diagnosis not present

## 2020-04-24 DIAGNOSIS — C9001 Multiple myeloma in remission: Secondary | ICD-10-CM | POA: Diagnosis not present

## 2020-04-24 DIAGNOSIS — Z9049 Acquired absence of other specified parts of digestive tract: Secondary | ICD-10-CM | POA: Diagnosis not present

## 2020-04-24 DIAGNOSIS — Z95810 Presence of automatic (implantable) cardiac defibrillator: Secondary | ICD-10-CM | POA: Diagnosis not present

## 2020-04-24 DIAGNOSIS — Z888 Allergy status to other drugs, medicaments and biological substances status: Secondary | ICD-10-CM | POA: Diagnosis not present

## 2020-04-24 DIAGNOSIS — Z800 Family history of malignant neoplasm of digestive organs: Secondary | ICD-10-CM | POA: Diagnosis not present

## 2020-04-24 DIAGNOSIS — Z743 Need for continuous supervision: Secondary | ICD-10-CM | POA: Diagnosis not present

## 2020-04-24 DIAGNOSIS — T82897A Other specified complication of cardiac prosthetic devices, implants and grafts, initial encounter: Secondary | ICD-10-CM | POA: Diagnosis not present

## 2020-04-24 DIAGNOSIS — I509 Heart failure, unspecified: Secondary | ICD-10-CM | POA: Diagnosis not present

## 2020-04-24 DIAGNOSIS — E785 Hyperlipidemia, unspecified: Secondary | ICD-10-CM | POA: Diagnosis not present

## 2020-04-24 DIAGNOSIS — I429 Cardiomyopathy, unspecified: Secondary | ICD-10-CM | POA: Diagnosis not present

## 2020-04-24 DIAGNOSIS — R5383 Other fatigue: Secondary | ICD-10-CM | POA: Diagnosis not present

## 2020-04-24 DIAGNOSIS — R918 Other nonspecific abnormal finding of lung field: Secondary | ICD-10-CM | POA: Diagnosis not present

## 2020-04-24 DIAGNOSIS — T82120A Displacement of cardiac electrode, initial encounter: Secondary | ICD-10-CM | POA: Diagnosis not present

## 2020-04-24 DIAGNOSIS — Z833 Family history of diabetes mellitus: Secondary | ICD-10-CM | POA: Diagnosis not present

## 2020-04-24 DIAGNOSIS — Z9221 Personal history of antineoplastic chemotherapy: Secondary | ICD-10-CM | POA: Diagnosis not present

## 2020-04-24 DIAGNOSIS — I255 Ischemic cardiomyopathy: Secondary | ICD-10-CM | POA: Diagnosis not present

## 2020-04-24 DIAGNOSIS — I5022 Chronic systolic (congestive) heart failure: Secondary | ICD-10-CM | POA: Diagnosis not present

## 2020-04-24 DIAGNOSIS — K219 Gastro-esophageal reflux disease without esophagitis: Secondary | ICD-10-CM | POA: Diagnosis not present

## 2020-04-24 DIAGNOSIS — I447 Left bundle-branch block, unspecified: Secondary | ICD-10-CM | POA: Diagnosis not present

## 2020-04-24 DIAGNOSIS — I081 Rheumatic disorders of both mitral and tricuspid valves: Secondary | ICD-10-CM | POA: Diagnosis not present

## 2020-04-24 DIAGNOSIS — R778 Other specified abnormalities of plasma proteins: Secondary | ICD-10-CM | POA: Diagnosis not present

## 2020-04-24 DIAGNOSIS — I4901 Ventricular fibrillation: Secondary | ICD-10-CM | POA: Diagnosis not present

## 2020-04-24 DIAGNOSIS — Z8673 Personal history of transient ischemic attack (TIA), and cerebral infarction without residual deficits: Secondary | ICD-10-CM | POA: Diagnosis not present

## 2020-04-24 DIAGNOSIS — K760 Fatty (change of) liver, not elsewhere classified: Secondary | ICD-10-CM | POA: Diagnosis not present

## 2020-04-24 DIAGNOSIS — Z4502 Encounter for adjustment and management of automatic implantable cardiac defibrillator: Secondary | ICD-10-CM | POA: Diagnosis not present

## 2020-04-25 ENCOUNTER — Other Ambulatory Visit: Payer: Self-pay

## 2020-04-25 ENCOUNTER — Inpatient Hospital Stay (HOSPITAL_BASED_OUTPATIENT_CLINIC_OR_DEPARTMENT_OTHER): Payer: Medicare Other | Admitting: Hematology & Oncology

## 2020-04-25 ENCOUNTER — Inpatient Hospital Stay: Payer: Medicare Other

## 2020-04-25 ENCOUNTER — Encounter: Payer: Self-pay | Admitting: Hematology & Oncology

## 2020-04-25 ENCOUNTER — Other Ambulatory Visit: Payer: Self-pay | Admitting: Family Medicine

## 2020-04-25 ENCOUNTER — Encounter: Payer: Self-pay | Admitting: Gastroenterology

## 2020-04-25 ENCOUNTER — Telehealth: Payer: Self-pay

## 2020-04-25 ENCOUNTER — Ambulatory Visit (INDEPENDENT_AMBULATORY_CARE_PROVIDER_SITE_OTHER): Payer: Medicare Other | Admitting: Gastroenterology

## 2020-04-25 VITALS — BP 114/84 | HR 91 | Temp 97.3°F | Ht 66.0 in | Wt 195.0 lb

## 2020-04-25 VITALS — BP 141/86 | HR 90 | Temp 96.6°F | Resp 19 | Wt 194.0 lb

## 2020-04-25 DIAGNOSIS — C9000 Multiple myeloma not having achieved remission: Secondary | ICD-10-CM | POA: Diagnosis not present

## 2020-04-25 DIAGNOSIS — K219 Gastro-esophageal reflux disease without esophagitis: Secondary | ICD-10-CM

## 2020-04-25 DIAGNOSIS — K760 Fatty (change of) liver, not elsewhere classified: Secondary | ICD-10-CM

## 2020-04-25 DIAGNOSIS — R197 Diarrhea, unspecified: Secondary | ICD-10-CM

## 2020-04-25 DIAGNOSIS — Z950 Presence of cardiac pacemaker: Secondary | ICD-10-CM | POA: Diagnosis not present

## 2020-04-25 LAB — CBC WITH DIFFERENTIAL (CANCER CENTER ONLY)
Abs Immature Granulocytes: 0.01 10*3/uL (ref 0.00–0.07)
Basophils Absolute: 0 10*3/uL (ref 0.0–0.1)
Basophils Relative: 1 %
Eosinophils Absolute: 0.1 10*3/uL (ref 0.0–0.5)
Eosinophils Relative: 2 %
HCT: 39.1 % (ref 36.0–46.0)
Hemoglobin: 12.3 g/dL (ref 12.0–15.0)
Immature Granulocytes: 0 %
Lymphocytes Relative: 22 %
Lymphs Abs: 1 10*3/uL (ref 0.7–4.0)
MCH: 28.8 pg (ref 26.0–34.0)
MCHC: 31.5 g/dL (ref 30.0–36.0)
MCV: 91.6 fL (ref 80.0–100.0)
Monocytes Absolute: 0.4 10*3/uL (ref 0.1–1.0)
Monocytes Relative: 8 %
Neutro Abs: 3 10*3/uL (ref 1.7–7.7)
Neutrophils Relative %: 67 %
Platelet Count: 113 10*3/uL — ABNORMAL LOW (ref 150–400)
RBC: 4.27 MIL/uL (ref 3.87–5.11)
RDW: 13.4 % (ref 11.5–15.5)
WBC Count: 4.4 10*3/uL (ref 4.0–10.5)
nRBC: 0 % (ref 0.0–0.2)

## 2020-04-25 LAB — CMP (CANCER CENTER ONLY)
ALT: 12 U/L (ref 0–44)
AST: 16 U/L (ref 15–41)
Albumin: 3.6 g/dL (ref 3.5–5.0)
Alkaline Phosphatase: 46 U/L (ref 38–126)
Anion gap: 7 (ref 5–15)
BUN: 19 mg/dL (ref 8–23)
CO2: 27 mmol/L (ref 22–32)
Calcium: 9.5 mg/dL (ref 8.9–10.3)
Chloride: 108 mmol/L (ref 98–111)
Creatinine: 0.85 mg/dL (ref 0.44–1.00)
GFR, Est AFR Am: >60 mL/min (ref >60)
GFR, Estimated: >60 mL/min (ref >60)
Glucose, Bld: 106 mg/dL — ABNORMAL HIGH (ref 70–99)
Potassium: 3.7 mmol/L (ref 3.5–5.1)
Sodium: 142 mmol/L (ref 135–145)
Total Bilirubin: 0.3 mg/dL (ref 0.3–1.2)
Total Protein: 7.2 g/dL (ref 6.5–8.1)

## 2020-04-25 NOTE — Progress Notes (Signed)
Hematology and Oncology Follow Up Visit  Tami [name] [id number] Nov 08, 1956 64 y.o. 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  We probably saw her about 3 months ago.  She has been doing pretty well.  She does have other health problems.  She saw her cardiologist yesterday.  She has some eye difficulties.  She has to go see a ophthalmologist in [place].  She is not sure what the eye problem really is.  She does feel tired.  The cardiologist adjusted her pacemaker.  When we last saw her, her M spike was actually stable at 0.9 g/dL.  She is eating well.  Her weight is stable.  She has had no nausea or vomiting.  She has had no rashes.  There has been no leg swelling.  She has had no cough.  She has had no bleeding.  Overall, her performance status is ECOG 0.     Medications:  Allergies as of 04/25/2020      Reactions   Benazepril Anaphylaxis, Swelling, Hives   angioedema Throat and lip swelling   Ondansetron Hcl Hives   Redness and hives post IV admin on 07/05/17   Codeine Nausea And Vomiting   Morphine Hives   Redness and hives noted post IV admin on 07/05/17      Medication List       Accurate as of April 25, 2020  2:38 PM. If you have any questions, ask your nurse or doctor.        STOP taking these medications   ACETAMINOPHEN-BUTALBITAL 50-325 MG Tabs Stopped by: Jackquline Denmark, MD     TAKE these medications   acetaminophen 325 MG tablet Commonly known as: TYLENOL Take 650 mg by mouth as needed.   albuterol 108 (90 Base) MCG/ACT inhaler Commonly known as: VENTOLIN HFA Inhale 2 puffs into the lungs every 6 (six) hours as needed for  wheezing or shortness of breath.   amLODipine 10 MG tablet Commonly known as: NORVASC Take 1 tablet (10 mg total) by mouth 2 (two) times daily.   aspirin EC 81 MG tablet Take 1 tablet (81 mg total) by mouth daily.   carvedilol 25 MG tablet Commonly known as: COREG Take 1 tablet (25 mg total) by mouth 2 (two) times daily with a meal.   cetirizine 10 MG tablet Commonly known as: ZYRTEC Take 10 mg by mouth daily.   cyclobenzaprine 10 MG tablet Commonly known as: FLEXERIL Take 1 tablet (10 mg total) by mouth at bedtime.   FeroSul 325 (65 FE) MG tablet Generic drug: ferrous sulfate Take 325 mg by mouth daily with breakfast.   fluticasone 50 MCG/ACT nasal spray Commonly known as: FLONASE Place 2 sprays into both nostrils daily. What changed:   when to take this  reasons to take this   furosemide 20 MG tablet Commonly known as: LASIX Take 1 tablet (20 mg total) by mouth daily as needed for fluid or edema.   hydrOXYzine 10 MG tablet Commonly known as: ATARAX/VISTARIL Take 1 tablet (10 mg total) by mouth 3 (three) times daily as needed for itching.   magnesium oxide 400 MG tablet Commonly known as: MAG-OX Take  1 tablet (400 mg total) by mouth 2 (two) times daily.   meclizine 12.5 MG tablet Commonly known as: ANTIVERT Take 12.5 mg by mouth 3 (three) times daily.   metoCLOPramide 10 MG tablet Commonly known as: Reglan Take 1 tablet (10 mg total) by mouth 4 (four) times daily. For migraine or headache or nausea What changed:   when to take this  reasons to take this   montelukast 10 MG tablet Commonly known as: Singulair Take 1 tablet (10 mg total) by mouth at bedtime. Start taking Singulair on 11/16/2019. What changed:   when to take this  reasons to take this   MULTIVITAMIN ADULT PO Take 1 tablet by mouth daily.   Naproxen 375 MG Tbec Take 1 tablet (375 mg total) by mouth every morning.   Nurtec 75 MG Tbdp Generic drug: Rimegepant Sulfate Take 75 mg  by mouth daily as needed. For migraines. Take as close to onset of migraine as possible. One daily maximum.   ondansetron 4 MG tablet Commonly known as: Zofran Take 1 tablet (4 mg total) by mouth every 8 (eight) hours as needed for nausea or vomiting.   pantoprazole 40 MG tablet Commonly known as: PROTONIX Take 1 tablet (40 mg total) by mouth 2 (two) times daily.   potassium chloride SA 20 MEQ tablet Commonly known as: KLOR-CON Take 1 tablet (20 mEq total) by mouth daily.   Premarin vaginal cream Generic drug: conjugated estrogens Place 1 Applicatorful vaginally as needed.   prochlorperazine 10 MG tablet Commonly known as: COMPAZINE Take 1 tablet (10 mg total) by mouth every 6 (six) hours as needed (Nausea or vomiting).   promethazine 25 MG tablet Commonly known as: PHENERGAN Take 1 tablet (25 mg total) by mouth every 8 (eight) hours as needed for nausea or vomiting.   topiramate 50 MG tablet Commonly known as: Topamax Take 1 tablet (50 mg total) by mouth 2 (two) times daily.   triamterene-hydrochlorothiazide 37.5-25 MG tablet Commonly known as: MAXZIDE-25 Take 1 tablet by mouth daily.   Vitamin D-3 25 MCG (1000 UT) Caps Take 1 capsule by mouth daily.       Allergies:  Allergies  Allergen Reactions  . Benazepril Anaphylaxis, Swelling and Hives    angioedema Throat and lip swelling  . Ondansetron Hcl Hives    Redness and hives post IV admin on 07/05/17  . Codeine Nausea And Vomiting  . Morphine Hives    Redness and hives noted post IV admin on 07/05/17    Past Medical History, Surgical history, Social history, and Family History were reviewed and updated.  Review of Systems: Review of Systems  Constitutional: Negative.   HENT: Negative.   Eyes: Negative.   Respiratory: Negative.   Cardiovascular: Negative.   Gastrointestinal: Negative.   Genitourinary: Negative.   Musculoskeletal: Negative.   Skin: Negative.   Neurological: Negative.     Endo/Heme/Allergies: Negative.   Psychiatric/Behavioral: Negative.       Physical Exam:  weight is 194 lb (88 kg). Her temporal temperature is 96.6 F (35.9 C) (abnormal). Her blood pressure is 141/86 (abnormal) and her pulse is 90. Her respiration is 19 and oxygen saturation is 99%.   Wt Readings from Last 3 Encounters:  04/25/20 194 lb (88 kg)  04/25/20 195 lb (88.5 kg)  04/12/20 192 lb (87.1 kg)    Physical Exam Vitals reviewed.  HENT:     Head: Normocephalic and atraumatic.  Eyes:     Pupils: Pupils are equal, round, and  reactive to light.  Cardiovascular:     Rate and Rhythm: Normal rate and regular rhythm.     Heart sounds: Normal heart sounds.  Pulmonary:     Effort: Pulmonary effort is normal.     Breath sounds: Normal breath sounds.  Abdominal:     General: Bowel sounds are normal.     Palpations: Abdomen is soft.  Musculoskeletal:        General: No tenderness or deformity. Normal range of motion.     Cervical back: Normal range of motion.  Lymphadenopathy:     Cervical: No cervical adenopathy.  Skin:    General: Skin is warm and dry.     Findings: No erythema or rash.  Neurological:     Mental Status: She is alert and oriented to person, place, and time.  Psychiatric:        Behavior: Behavior normal.        Thought Content: Thought content normal.        Judgment: Judgment normal.      Lab Results  Component Value Date   WBC 4.4 04/25/2020   HGB 12.3 04/25/2020   HCT 39.1 04/25/2020   MCV 91.6 04/25/2020   PLT 113 (L) 04/25/2020   Lab Results  Component Value Date   FERRITIN 81 04/22/2020   IRON 67 04/22/2020   TIBC 369 04/22/2020   UIBC 302 04/22/2020   IRONPCTSAT 18 (L) 04/22/2020   Lab Results  Component Value Date   RETICCTPCT 0.8 07/16/2016   RBC 4.27 04/25/2020   RETICCTABS 32,640 07/16/2016   Lab Results  Component Value Date   KPAFRELGTCHN 38.1 (H) 02/02/2020   LAMBDASER 6.3 02/02/2020   KAPLAMBRATIO 6.05 (H) 02/02/2020    Lab Results  Component Value Date   IGGSERUM 2,050 (H) 02/02/2020   IGA 93 02/02/2020   IGMSERUM 64 02/02/2020   Lab Results  Component Value Date   TOTALPROTELP 7.3 02/02/2020   ALBUMINELP 3.3 02/02/2020   A1GS 0.2 02/02/2020   A2GS 1.0 02/02/2020   BETS 1.1 02/02/2020   BETA2SER 0.5 07/16/2016   GAMS 1.6 02/02/2020   MSPIKE 0.9 (H) 02/02/2020   SPEI Comment 12/07/2019     Chemistry      Component Value Date/Time   NA 142 04/25/2020 1253   NA 143 11/08/2017 1127   NA 140 12/31/2016 1000   K 3.7 04/25/2020 1253   K 4.0 11/08/2017 1127   K 3.6 12/31/2016 1000   CL 108 04/25/2020 1253   CL 107 11/08/2017 1127   CO2 27 04/25/2020 1253   CO2 25 11/08/2017 1127   CO2 24 12/31/2016 1000   BUN 19 04/25/2020 1253   BUN 19 11/08/2017 1127   BUN 22.3 12/31/2016 1000   CREATININE 0.85 04/25/2020 1253   CREATININE 0.9 11/08/2017 1127   CREATININE 0.8 12/31/2016 1000      Component Value Date/Time   CALCIUM 9.5 04/25/2020 1253   CALCIUM 9.9 11/08/2017 1127   CALCIUM 9.5 12/31/2016 1000   ALKPHOS 46 04/25/2020 1253   ALKPHOS 44 11/08/2017 1127   ALKPHOS 51 12/31/2016 1000   AST 16 04/25/2020 1253   AST 28 12/31/2016 1000   ALT 12 04/25/2020 1253   ALT 36 11/08/2017 1127   ALT 16 12/31/2016 1000   BILITOT 0.3 04/25/2020 1253   BILITOT 0.46 12/31/2016 1000       Impression and Plan: Ms. Uresti is a very pleasant 64 yo African American female with a previous IgG kappa MGUS  with progression to myeloma.  I am just happy that she has been off therapy now for about 5 months.  Hopefully, we will see that the monoclonal myeloma studies are stable.  I think that the longer that we keep her off treatment, the better off that she will be in the future.  I am just hoping that there is nothing with her eyes that would suggest macular degeneration.  We will try to get her through the summertime.  Muscular back in September, after Labor Day.  Upon therapy for her.  [name], MD 6/17/20212:38 PM

## 2020-04-25 NOTE — Telephone Encounter (Signed)
Patient states she is having diarrhea for 2 weeks and wants to know if a stool test is neccessary to find out if she has C-Diff again or any other problems.

## 2020-04-25 NOTE — Telephone Encounter (Signed)
I ordered the stool culture please arrange a lab visit

## 2020-04-25 NOTE — Telephone Encounter (Signed)
Appt scheduled for 6/25

## 2020-04-25 NOTE — Patient Instructions (Signed)
If you are age 66 or older, your body mass index should be between 23-30. Your Body mass index is 31.47 kg/m. If this is out of the aforementioned range listed, please consider follow up with your Primary Care Provider.  If you are age 34 or younger, your body mass index should be between 19-25. Your Body mass index is 31.47 kg/m. If this is out of the aformentioned range listed, please consider follow up with your Primary Care Provider.   You have been scheduled for a Barium Esophogram at [facility] (1st floor of the hospital) on 05/01/20 at 9:30 AM. Please arrive 15 minutes prior to your appointment for registration. Make certain not to have anything to eat or drink 3 hours prior to your test. If you need to reschedule for any reason, please contact radiology at [phone] to do so. __________________________________________________________________ A barium swallow is an examination that concentrates on views of the esophagus. This tends to be a double contrast exam (barium and two liquids which, when combined, create a gas to distend the wall of the oesophagus) or single contrast (non-ionic iodine based). The study is usually tailored to your symptoms so a good history is essential. Attention is paid during the study to the form, structure and configuration of the esophagus, looking for functional disorders (such as aspiration, dysphagia, achalasia, motility and reflux) EXAMINATION You may be asked to change into a gown, depending on the type of swallow being performed. A radiologist and radiographer will perform the procedure. The radiologist will advise you of the type of contrast selected for your procedure and direct you during the exam. You will be asked to stand, sit or lie in several different positions and to hold a small amount of fluid in your mouth before being asked to swallow while the imaging is performed .In some instances you may be asked to swallow barium coated  marshmallows to assess the motility of a solid food bolus. The exam can be recorded as a digital or video fluoroscopy procedure. POST PROCEDURE It will take 1-2 days for the barium to pass through your system. To facilitate this, it is important, unless otherwise directed, to increase your fluids for the next 24-48hrs and to resume your normal diet.  This test typically takes about 30 minutes to perform. __________________________________________________________________________________   Continue Protonix 40 mg twice daily.  EGD with dil after cardiac clearance.   Thank you for choosing me and [facility].   Tami Denmark, MD   Call in two weeks with an update.

## 2020-04-25 NOTE — Telephone Encounter (Signed)
Pt has a question regarding a test she had performed, she is unsure of what it was and a question about a stool vial.

## 2020-04-25 NOTE — Progress Notes (Signed)
Chief Complaint:   Referring Provider:  Mosie Lukes, MD      ASSESSMENT AND PLAN;   #1. GERD with eso dysphagia. H/O eso stricture s/p EGD with dil 58Fr 07/30/2017 (Dr Reather Laurence), Bx- neg EoE/neg HP. Rpt EGD 07/14/2018: neg.   #2. Diarrhea likely d/t Revlimid (resolved). Previous H/O constipation. Neg colon 06/2017. Neg CT abdo/pelvis 01/2019, 11/2018 except for fatty liver. Remote H/O C. Diff.  #3. Fatty liver with Nl LFTs.  No liver cirrhosis.  #3.  Comorbid conditions include MM, HTN, CHF EF 30-35% 08/2019, OSA, LBBB, H/O TIA, pacemaker.  Plan:  -Ba swallow with Ba tab -Protonix 40 bid to continue. -Note that patient is undergoing cardiology work-up currently.  EGD with dil after cardio clearence (Dr Ola Spurr) mid-late July. Likely in [facility] (depending upon 2DE).  Certainly, earlier if her cardiology work-up is completed by then. -I have instructed her to chew foods especially meats and breads well and eat slowly.   HPI:    Tami [name] is a 64 y.o. female  Doing much better from GI standpoint. No further diarrhea.  She is currently having normal bowel movements.  Constipation is not that bad.  Main complaints today is that of solid food dysphagia as before.  Solids getting hung up in mid chest.  She responded well to dil[facility] at [facility].  Occasional heartburn which is better with Protonix.  She does complain of fatigue and is undergoing cardiac work-up.  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  It is currently being evaluated.  She has 2DE scheduled for July 8.  Was seen by Dr. Ola Spurr yesterday.   Past Medical History:  Diagnosis Date  . Abnormal SPEP 07/26/2016  . Arthritis    knees, hands  . Back pain 07/14/2016  . Bowel obstruction ([place]) 11/2014  . C. difficile diarrhea   . CHF (congestive heart failure) ([place])   . Colitis   . Congestive heart failure ([place]) 02/24/2015   S/p pacemaker  . Depression   . Elevated sed  rate 08/15/2017  . Epigastric pain 03/22/2017  . GERD (gastroesophageal reflux disease)   . Heart disease   . Hyperlipidemia   . Hypertension   . 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 Low back pain 07/14/2016  . 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 Multiple myeloma ([place]) 07/20/2018  . Multiple myeloma not having achieved remission ([place]) 07/20/2018  . Myalgia 12/31/2016  . Obstructive sleep apnea 03/31/2015  . SOB (shortness of breath) 04/09/2016  . Stroke ([place])    TIAs  . UTI (urinary tract infection)     Past Surgical History:  Procedure Laterality Date  . ABDOMINAL HYSTERECTOMY     menorraghia, 2006, total  . CHOLECYSTECTOMY    . COLONOSCOPY  2018   [facility] per patient  . ESOPHAGOGASTRODUODENOSCOPY  2018   [facility]   . KNEE SURGERY     right, repair torn torn cartialage  . PACEMAKER INSERTION  08/2014  . TONSILLECTOMY    . TUBAL LIGATION      Family History  Problem Relation Age of Onset  . Diabetes Mother   . Hypertension Mother   . Heart disease Mother        s/p 1 stent  . Hyperlipidemia Mother   . Arthritis Mother   . Cancer Father        COLON  . Colon cancer Father 28  . Irritable bowel syndrome Sister   .  Hyperlipidemia Daughter   . Hypertension Daughter   . Hypertension Maternal Grandmother   . Arthritis Maternal Grandmother   . Heart disease Maternal Grandfather        MI  . Hypertension Maternal Grandfather   . Arthritis Maternal Grandfather   . Hypertension Son   . Esophageal cancer Neg Hx     Social History   Tobacco Use  . Smoking status: Never Smoker  . Smokeless tobacco: Never Used  Vaping Use  . Vaping Use: Never used  Substance Use Topics  . Alcohol use: No  . Drug use: No  . Allergies as of 04/25/2020      Reactions   Benazepril Anaphylaxis, Swelling   angioedema Throat and lip swelling   Ondansetron Hcl Hives   Redness and hives post IV admin on 07/05/17   Codeine Nausea  And Vomiting   Morphine Hives   Redness and hives noted post IV admin on 07/05/17      Medication List       Accurate as of April 25, 2020 11:47 AM. If you have any questions, ask your nurse or doctor.        STOP taking these medications   ACETAMINOPHEN-BUTALBITAL 50-325 MG Tabs Stopped by: Jackquline Denmark, MD     TAKE these medications   acetaminophen 325 MG tablet Commonly known as: TYLENOL Take 650 mg by mouth as needed.   albuterol 108 (90 Base) MCG/ACT inhaler Commonly known as: VENTOLIN HFA Inhale 2 puffs into the lungs every 6 (six) hours as needed for wheezing or shortness of breath.   amLODipine 10 MG tablet Commonly known as: NORVASC Take 1 tablet (10 mg total) by mouth 2 (two) times daily.   aspirin EC 81 MG tablet Take 1 tablet (81 mg total) by mouth daily.   carvedilol 25 MG tablet Commonly known as: COREG Take 1 tablet (25 mg total) by mouth 2 (two) times daily with a meal.   cetirizine 10 MG tablet Commonly known as: ZYRTEC Take 10 mg by mouth daily.   cyclobenzaprine 10 MG tablet Commonly known as: FLEXERIL Take 1 tablet (10 mg total) by mouth at bedtime.   FeroSul 325 (65 FE) MG tablet Generic drug: ferrous sulfate Take 325 mg by mouth daily with breakfast.   fluticasone 50 MCG/ACT nasal spray Commonly known as: FLONASE Place 2 sprays into both nostrils daily. What changed:   when to take this  reasons to take this   furosemide 20 MG tablet Commonly known as: LASIX Take 1 tablet (20 mg total) by mouth daily as needed for fluid or edema.   hydrOXYzine 10 MG tablet Commonly known as: ATARAX/VISTARIL Take 1 tablet (10 mg total) by mouth 3 (three) times daily as needed for itching.   magnesium oxide 400 MG tablet Commonly known as: MAG-OX Take 1 tablet (400 mg total) by mouth 2 (two) times daily.   meclizine 12.5 MG tablet Commonly known as: ANTIVERT Take 12.5 mg by mouth 3 (three) times daily.   metoCLOPramide 10 MG tablet Commonly  known as: Reglan Take 1 tablet (10 mg total) by mouth 4 (four) times daily. For migraine or headache or nausea What changed:   when to take this  reasons to take this   montelukast 10 MG tablet Commonly known as: Singulair Take 1 tablet (10 mg total) by mouth at bedtime. Start taking Singulair on 11/16/2019. What changed:   when to take this  reasons to take this   MULTIVITAMIN ADULT PO Take  1 tablet by mouth daily.   Naproxen 375 MG Tbec Take 1 tablet (375 mg total) by mouth every morning.   Nurtec 75 MG Tbdp Generic drug: Rimegepant Sulfate Take 75 mg by mouth daily as needed. For migraines. Take as close to onset of migraine as possible. One daily maximum.   ondansetron 4 MG tablet Commonly known as: Zofran Take 1 tablet (4 mg total) by mouth every 8 (eight) hours as needed for nausea or vomiting.   pantoprazole 40 MG tablet Commonly known as: PROTONIX Take 1 tablet (40 mg total) by mouth 2 (two) times daily.   potassium chloride SA 20 MEQ tablet Commonly known as: KLOR-CON Take 1 tablet (20 mEq total) by mouth daily.   Premarin vaginal cream Generic drug: conjugated estrogens Place 1 Applicatorful vaginally as needed.   prochlorperazine 10 MG tablet Commonly known as: COMPAZINE Take 1 tablet (10 mg total) by mouth every 6 (six) hours as needed (Nausea or vomiting).   promethazine 25 MG tablet Commonly known as: PHENERGAN Take 1 tablet (25 mg total) by mouth every 8 (eight) hours as needed for nausea or vomiting.   topiramate 50 MG tablet Commonly known as: Topamax Take 1 tablet (50 mg total) by mouth 2 (two) times daily.   triamterene-hydrochlorothiazide 37.5-25 MG tablet Commonly known as: MAXZIDE-25 Take 1 tablet by mouth daily.   Vitamin D-3 25 MCG (1000 UT) Caps Take 1 capsule by mouth daily.         Allergies  Allergen Reactions  . Benazepril Anaphylaxis and Swelling    angioedema Throat and lip swelling  . Ondansetron Hcl Hives     Redness and hives post IV admin on 07/05/17  . Codeine Nausea And Vomiting  . Morphine Hives    Redness and hives noted post IV admin on 07/05/17    Review of Systems:  Constitutional: Denies fever, chills, diaphoresis, appetite change and fatigue.  HEENT: Denies photophobia, eye pain, redness, hearing loss, ear pain, congestion, sore throat, rhinorrhea, sneezing, mouth sores, neck pain, neck stiffness and tinnitus.   Respiratory: Denies SOB, DOE, cough, chest tightness,  and wheezing.   Cardiovascular: Denies chest pain, palpitations and leg swelling.  Genitourinary: Denies dysuria, urgency, frequency, hematuria, flank pain and difficulty urinating.  Musculoskeletal: Denies myalgias, back pain, joint swelling, arthralgias and gait problem.  Skin: No rash.  Neurological: Denies dizziness, seizures, syncope, weakness, light-headedness, numbness and headaches.  Hematological: Denies adenopathy. Easy bruising, personal or family bleeding history  Psychiatric/Behavioral: No anxiety or depression     Physical Exam:    BP 114/84   Pulse 91   Temp (!) 97.3 F (36.3 C)   Ht '5\' 6"'$  (1.676 m)   Wt 195 lb (88.5 kg)   BMI 31.47 kg/m  Filed Weights   04/25/20 1128  Weight: 195 lb (88.5 kg)   Constitutional:  Well-developed, in no acute distress. Psychiatric: Normal mood and affect. Behavior is normal. HEENT: Pupils normal.  Conjunctivae are normal. No scleral icterus. Neck supple.  Cardiovascular: Normal rate, regular rhythm. No edema Pulmonary/chest: Effort normal and breath sounds normal. No wheezing, rales or rhonchi. Abdominal: Soft, nondistended. Nontender. Bowel sounds active throughout. There are no masses palpable. No hepatomegaly. Rectal:  defered Neurological: Alert and oriented to person place and time. Skin: Skin is warm and dry. No rashes noted.  Data Reviewed: I have personally reviewed following labs and imaging studies  CBC: CBC Latest Ref Rng & Units 04/22/2020  04/12/2020 04/04/2020  WBC 4.0 - 10.5 K/uL  4.6 4.3 3.1(L)  Hemoglobin 12.0 - 15.0 g/dL 12.4 12.5 11.5(L)  Hematocrit 36 - 46 % 38.9 39.5 35.2(L)  Platelets 150 - 400 K/uL 144(L) 152 129.0(L)    CMP: CMP Latest Ref Rng & Units 04/22/2020 04/12/2020 04/04/2020  Glucose 70 - 99 mg/dL 100(H) 111(H) 104(H)  BUN 8 - 23 mg/dL '18 18 13  '$ Creatinine 0.44 - 1.00 mg/dL 0.81 0.61 0.58  Sodium 135 - 145 mmol/L 142 141 139  Potassium 3.5 - 5.1 mmol/L 4.0 4.1 3.6  Chloride 98 - 111 mmol/L 111 105 107  CO2 22 - 32 mmol/L '25 25 27  '$ Calcium 8.9 - 10.3 mg/dL 9.1 8.9 9.1  Total Protein 6.5 - 8.1 g/dL 7.2 7.8 6.7  Total Bilirubin 0.3 - 1.2 mg/dL 0.3 0.5 0.4  Alkaline Phos 38 - 126 U/L 39 48 48  AST 15 - 41 U/L '15 19 23  '$ ALT 0 - 44 U/L '11 16 13    '$ GFR: Estimated Creatinine Clearance: 77.6 mL/min (by C-G formula based on SCr of 0.81 mg/dL). Liver Function Tests: Recent Labs  Lab 04/22/20 0837  AST 15  ALT 11  ALKPHOS 39  BILITOT 0.3  PROT 7.2  ALBUMIN 3.6   Extensive notes were reviewed.   Carmell Austria, MD 04/25/2020, 11:47 AM  Cc: Mosie Lukes, MD

## 2020-04-26 LAB — IGG, IGA, IGM
IgA: 75 mg/dL — ABNORMAL LOW (ref 87–352)
IgG (Immunoglobin G), Serum: 1837 mg/dL — ABNORMAL HIGH (ref 586–1602)
IgM (Immunoglobulin M), Srm: 64 mg/dL (ref 26–217)

## 2020-04-26 LAB — KAPPA/LAMBDA LIGHT CHAINS
Kappa free light chain: 39.5 mg/L — ABNORMAL HIGH (ref 3.3–19.4)
Kappa, lambda light chain ratio: 5.98 — ABNORMAL HIGH (ref 0.26–1.65)
Lambda free light chains: 6.6 mg/L (ref 5.7–26.3)

## 2020-04-29 LAB — PROTEIN ELECTROPHORESIS, SERUM, WITH REFLEX
A/G Ratio: 0.8 (ref 0.7–1.7)
Albumin ELP: 3.2 g/dL (ref 2.9–4.4)
Alpha-1-Globulin: 0.3 g/dL (ref 0.0–0.4)
Alpha-2-Globulin: 1.1 g/dL — ABNORMAL HIGH (ref 0.4–1.0)
Beta Globulin: 1.1 g/dL (ref 0.7–1.3)
Gamma Globulin: 1.7 g/dL (ref 0.4–1.8)
Globulin, Total: 4.2 g/dL — ABNORMAL HIGH (ref 2.2–3.9)
M-Spike, %: 1.2 g/dL — ABNORMAL HIGH
SPEP Interpretation: 0
Total Protein ELP: 7.4 g/dL (ref 6.0–8.5)

## 2020-04-29 LAB — IMMUNOFIXATION REFLEX, SERUM
IgA: 76 mg/dL — ABNORMAL LOW (ref 87–352)
IgG (Immunoglobin G), Serum: 1843 mg/dL — ABNORMAL HIGH (ref 586–1602)
IgM (Immunoglobulin M), Srm: 58 mg/dL (ref 26–217)

## 2020-05-01 ENCOUNTER — Ambulatory Visit (HOSPITAL_COMMUNITY): Payer: Medicare Other

## 2020-05-03 ENCOUNTER — Other Ambulatory Visit: Payer: Self-pay

## 2020-05-03 ENCOUNTER — Telehealth: Payer: Self-pay

## 2020-05-03 ENCOUNTER — Other Ambulatory Visit: Payer: Medicare Other

## 2020-05-03 MED ORDER — VITAMIN D-3 25 MCG (1000 UT) PO CAPS: 1.0000 | ORAL_CAPSULE | Freq: Every day | ORAL | 2 refills | Status: DC

## 2020-05-06 DIAGNOSIS — E559 Vitamin D deficiency, unspecified: Secondary | ICD-10-CM | POA: Diagnosis not present

## 2020-05-06 DIAGNOSIS — G4733 Obstructive sleep apnea (adult) (pediatric): Secondary | ICD-10-CM | POA: Diagnosis not present

## 2020-05-06 DIAGNOSIS — C9001 Multiple myeloma in remission: Secondary | ICD-10-CM | POA: Diagnosis not present

## 2020-05-06 DIAGNOSIS — I110 Hypertensive heart disease with heart failure: Secondary | ICD-10-CM | POA: Diagnosis not present

## 2020-05-06 DIAGNOSIS — I5042 Chronic combined systolic (congestive) and diastolic (congestive) heart failure: Secondary | ICD-10-CM | POA: Diagnosis not present

## 2020-05-07 DIAGNOSIS — G4733 Obstructive sleep apnea (adult) (pediatric): Secondary | ICD-10-CM | POA: Diagnosis not present

## 2020-05-07 DIAGNOSIS — Z950 Presence of cardiac pacemaker: Secondary | ICD-10-CM | POA: Diagnosis not present

## 2020-05-07 DIAGNOSIS — I10 Essential (primary) hypertension: Secondary | ICD-10-CM | POA: Diagnosis not present

## 2020-05-07 DIAGNOSIS — C9000 Multiple myeloma not having achieved remission: Secondary | ICD-10-CM | POA: Diagnosis not present

## 2020-05-09 ENCOUNTER — Ambulatory Visit (INDEPENDENT_AMBULATORY_CARE_PROVIDER_SITE_OTHER): Payer: Medicare Other | Admitting: Family Medicine

## 2020-05-09 ENCOUNTER — Encounter: Payer: Self-pay | Admitting: Family Medicine

## 2020-05-09 ENCOUNTER — Other Ambulatory Visit: Payer: Self-pay

## 2020-05-09 ENCOUNTER — Other Ambulatory Visit: Payer: Medicare Other

## 2020-05-09 VITALS — BP 110/70 | HR 88 | Temp 97.5°F | Resp 18 | Ht 66.0 in | Wt 196.8 lb

## 2020-05-09 DIAGNOSIS — R197 Diarrhea, unspecified: Secondary | ICD-10-CM | POA: Diagnosis not present

## 2020-05-09 DIAGNOSIS — C9002 Multiple myeloma in relapse: Secondary | ICD-10-CM | POA: Diagnosis not present

## 2020-05-09 DIAGNOSIS — I10 Essential (primary) hypertension: Secondary | ICD-10-CM

## 2020-05-09 DIAGNOSIS — E785 Hyperlipidemia, unspecified: Secondary | ICD-10-CM

## 2020-05-09 DIAGNOSIS — I5041 Acute combined systolic (congestive) and diastolic (congestive) heart failure: Secondary | ICD-10-CM | POA: Insufficient documentation

## 2020-05-09 NOTE — Assessment & Plan Note (Signed)
Encouraged heart healthy diet, increase exercise, avoid trans fats, consider a krill oil cap daily 

## 2020-05-09 NOTE — Assessment & Plan Note (Signed)
Per hematology 

## 2020-05-09 NOTE — Assessment & Plan Note (Signed)
Cardiology sent spironolactone in for pt but she has not picked it up yet  F/u with cardiology Cards ov reviewed and labs

## 2020-05-09 NOTE — Patient Instructions (Signed)
Ventricular Tachycardia  Ventricular tachycardia is a fast heartbeat that begins in the lower chambers of the heart (ventricles). It is a type of abnormal heart rhythm (arrhythmia). A normal heartbeat usually starts when an area in the heart called the sinoatrial (SA) node releases an electrical signal. With ventricular tachycardia, electrical signals in the lower part of the heart fire abnormally and interfere with the electrical signals sent out by the SA node. A normal heart rate is 60-100 beats per minute. During an episode of ventricular tachycardia, the heart reaches 100 beats per minute or higher. This condition can be life-threatening and should be treated immediately. What are the causes? This condition is caused by abnormal electrical activity in the lower part of the heart. This may result from:  Medicines.  Diseases of the heart muscle (cardiomyopathy).  The heart not getting enough oxygen. This may be caused by blood flow problems in the arteries.  An inflammatory disease that affects multiple areas of the body (sarcoidosis).  Drug use, such as cocaine, amphetamine, or anabolic steroid use. What increases the risk? You are more likely to develop this condition if:  You have had a heart attack.  You have: ? Heart failure or cardiomyopathy. ? Heart defects that you were born with (congenital heart defects). ? Abnormal heart tissue. ? Heart valves that leak or are narrow. ? Diabetes. ? An infection that affects the heart. ? High blood pressure. ? An overactive or underactive thyroid. ? Sleep apnea. ? A family history of stopped heartbeat (cardiac arrest) or coronary artery disease. ? High cholesterol.  You smoke.  You drink alcohol heavily.  You use drugs, such as cocaine. What are the signs or symptoms? Symptoms of this condition include:  A pounding heartbeat.  Feeling as if your heart is skipping beats or fluttering (palpitations).  Shortness of  breath.  Anxiety.  Dizziness.  Light-headedness.  Fainting.  Chest pain.  Cardiac arrest caused by an irregular heartbeat (arrhythmia). How is this diagnosed? This condition may be diagnosed based on:  Your symptoms and medical history.  A physical exam.  Electrocardiogram (ECG). This test is done to check for problems with electrical activity in the heart.  Holter monitor or event monitor test. This test involves wearing a portable device that monitors your heart rate over time. You may also have other tests, including:  Blood tests.  Chest X-ray.  Echocardiogram. This test involves using sound waves to create images of the heart.  Angiogram. During this test, dye is injected into your bloodstream, and then X-rays are taken. The dye lets your health care provider see how blood flows through your arteries.  Exercise stress test. During this test, an ECG is done while you exercise on a treadmill.  Cardiac CT scan or cardiac MRI. How is this treated? Treatment for this condition depends on the cause. Treatment may include:  Medicines that slow the heart rate and return it to a normal rhythm (anti-arrhythmics).  An electric shock (cardioversion) that makes the heart go back to a normal rhythm.  An electrophysiology study. This procedure can help locate areas of heart tissue that are causing rapid heartbeats. ? In this procedure, a thin, flexible tube (catheter) is inserted into one of your veins and moved to your heart to evaluate your heart's electrical activity. ? In some cases, the heart tissue that is causing problems may be killed with radiofrequency energy delivered through the catheter (radiofrequency ablation). This may help your heart keep a normal rhythm.  An implantable cardioverter defibrillator (ICD). This is a small device that monitors your heartbeat. When it senses an irregular heartbeat, it sends a shock to bring the heartbeat back to normal. The ICD is  implanted under the skin in the chest.  Surgery to improve blood flow to the heart.  Genetic counseling to check whether your family members are at risk for ventricular tachycardia. Follow these instructions at home: Lifestyle  Do not use any products that contain nicotine or tobacco, such as cigarettes and e-cigarettes. If you need help quitting, ask your health care provider.  Do not use stimulant drugs, such as cocaine or methamphetamines.  Maintain a healthy weight.  Manage stress. Try to do this with relaxation exercises, yoga, quiet time, or meditation. Eating and drinking  Eat a healthy diet. This includes plenty of fruits and vegetables, whole grains, lean meats, and low-fat or fat-free dairy products.  Avoid eating foods that are high in saturated fat, trans fat, sugar, or salt (sodium).  Ask your health care provider if you may drink alcohol. ? If alcohol triggers episodes of ventricular tachycardia, do not drink alcohol. ? If alcohol does not trigger episodes, limit alcohol intake to no more than 1 drink a day for nonpregnant women and 2 drinks a day for men. One drink equals 12 oz of beer, 5 oz of wine, or 1 oz of hard liquor. General instructions  Take over-the-counter and prescription medicines only as told by your health care provider.  Exercise regularly. Aim for 150 minutes of moderate exercise or 75 minutes of vigorous exercise per week. Ask your health care provider what exercises are safe for you.  Keep all follow-up visits as told by your health care provider. This is important. Contact a health care provider if:  Your symptoms get worse.  You develop new symptoms, such as new palpitations.  You feel depressed. Get help right away if:  You have an episode of ventricular tachycardia that lasts 30 seconds or more.  You have chest pain.  You have trouble breathing. These symptoms may represent a serious problem that is an emergency. Do not wait to see  if the symptoms will go away. Get medical help right away. Call your local emergency services (911 in the [place] Do not drive yourself to the hospital. Summary  Ventricular tachycardia is a fast heartbeat that begins in the lower chambers of the heart. This condition can be life-threatening and should be treated immediately.  This condition may be treated with medicines, electric shock, radiofrequency energy, surgery, or insertion of an implantable cardioverter defibrillator (ICD).  Get help right away if you have chest pain, trouble breathing, or ventricular tachycardia symptoms that last more than 30 seconds. This information is not intended to replace advice given to you by your health care provider. Make sure you discuss any questions you have with your health care provider. Document Revised: 10/08/2017 Document Reviewed: 12/17/2016 Elsevier Patient Education  [place].

## 2020-05-09 NOTE — Assessment & Plan Note (Signed)
Well controlled, no changes to meds. Encouraged heart healthy diet such as the DASH diet and exercise as tolerated.  °

## 2020-05-09 NOTE — Progress Notes (Signed)
[name], female    DOB: 10-15-1956  Age: 64 y.o. MRN: [id number]    Subjective:  Subjective  HPI Tami [name] presents for f/u from 5 day admission for vent tachy.  Her mag was abnormal and echo was done and L heart cath    She saw cardiology 2 days ago and labs were done    She is feeling much better and already has f/u with pcp next month.    Review of Systems  Constitutional: Negative for appetite change, diaphoresis, fatigue and unexpected weight change.  Eyes: Negative for pain, redness and visual disturbance.  Respiratory: Negative for cough, chest tightness, shortness of breath and wheezing.   Cardiovascular: Negative for chest pain, palpitations and leg swelling.  Endocrine: Negative for cold intolerance, heat intolerance, polydipsia, polyphagia and polyuria.  Genitourinary: Negative for difficulty urinating, dysuria and frequency.  Neurological: Negative for dizziness, light-headedness, numbness and headaches.    History Past Medical History:  Diagnosis Date  . Abnormal SPEP 07/26/2016  . Arthritis    knees, hands  . Back pain 07/14/2016  . Bowel obstruction ([place]) 11/2014  . C. difficile diarrhea   . CHF (congestive heart failure) ([place])   . Colitis   . Congestive heart failure ([place]) 02/24/2015   S/p pacemaker  . Depression   . Elevated sed rate 08/15/2017  . Epigastric pain 03/22/2017  . GERD (gastroesophageal reflux disease)   . Heart disease   . Hyperlipidemia   . Hypertension   . 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 Low back pain 07/14/2016  . 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 Multiple myeloma ([place]) 07/20/2018  . Multiple myeloma not having achieved remission ([place]) 07/20/2018  . Myalgia 12/31/2016  . Obstructive sleep apnea 03/31/2015  . SOB (shortness of breath) 04/09/2016  . Stroke ([place])    TIAs  . UTI (urinary tract infection)     She has a past surgical history that includes Pacemaker insertion (08/2014);  Cholecystectomy; Tubal ligation; Knee surgery; Abdominal hysterectomy; Tonsillectomy; Colonoscopy (2018); and Esophagogastroduodenoscopy (2018).   Her family history includes Arthritis in her maternal grandfather, maternal grandmother, and mother; Cancer in her father; Colon cancer (age of onset: 54) in her father; Diabetes in her mother; Heart disease in her maternal grandfather and mother; Hyperlipidemia in her daughter and mother; Hypertension in her daughter, maternal grandfather, maternal grandmother, mother, and son; Irritable bowel syndrome in her sister.She reports that she has never smoked. She has never used smokeless tobacco. She reports that she does not drink alcohol and does not use drugs.  Current Outpatient Medications on File Prior to Visit  Medication Sig Dispense Refill  . acetaminophen (TYLENOL) 325 MG tablet Take 650 mg by mouth as needed.     Marland Kitchen albuterol (PROVENTIL HFA;VENTOLIN HFA) 108 (90 Base) MCG/ACT inhaler Inhale 2 puffs into the lungs every 6 (six) hours as needed for wheezing or shortness of breath. 1 Inhaler 2  . aspirin EC 81 MG tablet Take 1 tablet (81 mg total) by mouth daily. 90 tablet 1  . butalbital-acetaminophen-caffeine (FIORICET WITH CODEINE) 50-325-40-30 MG capsule Take 1 capsule by mouth every 4 (four) hours as needed for headache.    . carvedilol (COREG) 25 MG tablet Take 1 tablet (25 mg total) by mouth 2 (two) times daily with a meal. 180 tablet 1  . cetirizine (ZYRTEC) 10 MG tablet Take 10 mg by mouth daily.    . Cholecalciferol (VITAMIN D-3) 25 MCG (1000 UT) CAPS Take 1 capsule (1,000  Units total) by mouth daily. 60 capsule 2  . conjugated estrogens (PREMARIN) vaginal cream Place 1 Applicatorful vaginally as needed.     . cyclobenzaprine (FLEXERIL) 10 MG tablet Take 1 tablet (10 mg total) by mouth at bedtime. 7 tablet 0  . ferrous sulfate (FEROSUL) 325 (65 FE) MG tablet Take 325 mg by mouth daily with breakfast.    . fluticasone (FLONASE) 50 MCG/ACT nasal  spray Place 2 sprays into both nostrils daily. (Patient taking differently: Place 2 sprays into both nostrils as needed. ) 16 g 0  . furosemide (LASIX) 20 MG tablet Take 1 tablet (20 mg total) by mouth daily as needed for fluid or edema. 30 tablet 2  . hydrOXYzine (ATARAX/VISTARIL) 10 MG tablet Take 1 tablet (10 mg total) by mouth 3 (three) times daily as needed for itching. 30 tablet 0  . losartan (COZAAR) 25 MG tablet Take by mouth.    . magnesium oxide (MAG-OX) 400 MG tablet Take 1 tablet (400 mg total) by mouth 2 (two) times daily. 60 tablet 1  . meclizine (ANTIVERT) 12.5 MG tablet Take 12.5 mg by mouth 3 (three) times daily.    . metoCLOPramide (REGLAN) 10 MG tablet Take 1 tablet (10 mg total) by mouth 4 (four) times daily. For migraine or headache or nausea (Patient taking differently: Take 10 mg by mouth as needed. For migraine or headache or nausea) 30 tablet 6  . montelukast (SINGULAIR) 10 MG tablet Take 1 tablet (10 mg total) by mouth at bedtime. Start taking Singulair on 11/16/2019. (Patient taking differently: Take 10 mg by mouth as needed. Start taking Singulair on 11/16/2019.) 20 tablet 0  . Multiple Vitamins-Minerals (MULTIVITAMIN ADULT PO) Take 1 tablet by mouth daily.     . Naproxen 375 MG TBEC Take 1 tablet (375 mg total) by mouth every morning. 30 tablet 2  . ondansetron (ZOFRAN) 4 MG tablet Take 1 tablet (4 mg total) by mouth every 8 (eight) hours as needed for nausea or vomiting. 30 tablet 1  . pantoprazole (PROTONIX) 40 MG tablet Take 1 tablet (40 mg total) by mouth 2 (two) times daily. 180 tablet 1  . potassium chloride SA (KLOR-CON) 20 MEQ tablet Take 1 tablet (20 mEq total) by mouth daily. 30 tablet 3  . prochlorperazine (COMPAZINE) 10 MG tablet Take 1 tablet (10 mg total) by mouth every 6 (six) hours as needed (Nausea or vomiting). 30 tablet 1  . promethazine (PHENERGAN) 25 MG tablet Take 1 tablet (25 mg total) by mouth every 8 (eight) hours as needed for nausea or vomiting.  30 tablet 2  . topiramate (TOPAMAX) 50 MG tablet Take 1 tablet (50 mg total) by mouth 2 (two) times daily. 60 tablet 3  . triamterene-hydrochlorothiazide (MAXZIDE-25) 37.5-25 MG tablet Take 1 tablet by mouth daily.     No current facility-administered medications on file prior to visit.     Objective:  Objective  Physical Exam Vitals and nursing note reviewed.  Constitutional:      Appearance: She is well-developed.  HENT:     Head: Normocephalic and atraumatic.  Eyes:     Conjunctiva/sclera: Conjunctivae normal.  Neck:     Thyroid: No thyromegaly.     Vascular: No carotid bruit or JVD.  Cardiovascular:     Rate and Rhythm: Normal rate and regular rhythm.     Heart sounds: Normal heart sounds. No murmur heard.   Pulmonary:     Effort: Pulmonary effort is normal. No respiratory distress.  Breath sounds: Normal breath sounds. No wheezing or rales.  Chest:     Chest wall: No tenderness.  Musculoskeletal:     Cervical back: Normal range of motion and neck supple.  Neurological:     Mental Status: She is alert and oriented to person, place, and time.    BP 110/70 (BP Location: Right Arm, Patient Position: Sitting, Cuff Size: Large)   Pulse 88   Temp (!) 97.5 F (36.4 C) (Temporal)   Resp 18   Ht '5\' 6"'$  (1.676 m)   Wt 196 lb 12.8 oz (89.3 kg)   SpO2 99%   BMI 31.76 kg/m  Wt Readings from Last 3 Encounters:  05/09/20 196 lb 12.8 oz (89.3 kg)  04/25/20 194 lb (88 kg)  04/25/20 195 lb (88.5 kg)     Lab Results  Component Value Date   WBC 4.4 04/25/2020   HGB 12.3 04/25/2020   HCT 39.1 04/25/2020   PLT 113 (L) 04/25/2020   GLUCOSE 106 (H) 04/25/2020   CHOL 193 01/16/2020   TRIG 120.0 01/16/2020   HDL 52.90 01/16/2020   LDLCALC 116 (H) 01/16/2020   ALT 12 04/25/2020   AST 16 04/25/2020   NA 142 04/25/2020   K 3.7 04/25/2020   CL 108 04/25/2020   CREATININE 0.85 04/25/2020   BUN 19 04/25/2020   CO2 27 04/25/2020   TSH 1.29 01/16/2020   INR 0.90 07/28/2018    HGBA1C 5.9 01/16/2020    DG Chest 2 View  Result Date: 04/12/2020 CLINICAL DATA:  Chest pain x3 hours. EXAM: CHEST - 2 VIEW COMPARISON:  September 08, 2019 FINDINGS: There is a dual lead AICD. There is no evidence of acute infiltrate, pleural effusion or pneumothorax. The heart size and mediastinal contours are within normal limits. The visualized skeletal structures are unremarkable. IMPRESSION: No active cardiopulmonary disease. Electronically Signed   By: Virgina Norfolk M.D.   On: 04/12/2020 22:07     Assessment & Plan:  Plan  I have discontinued Lannette Donath Nurtec and amLODipine. I am also having her maintain her conjugated estrogens, acetaminophen, pantoprazole, aspirin EC, ondansetron, albuterol, Multiple Vitamins-Minerals (MULTIVITAMIN ADULT PO), carvedilol, topiramate, furosemide, metoCLOPramide, promethazine, triamterene-hydrochlorothiazide, montelukast, prochlorperazine, fluticasone, cetirizine, cyclobenzaprine, meclizine, Naproxen, magnesium oxide, potassium chloride SA, hydrOXYzine, ferrous sulfate, Vitamin D-3, losartan, and butalbital-acetaminophen-caffeine.  No orders of the defined types were placed in this encounter.   Problem List Items Addressed This Visit      Unprioritized   Acute combined systolic and diastolic heart failure ([place]) - Primary    Cardiology sent spironolactone in for pt but she has not picked it up yet  F/u with cardiology Cards ov reviewed and labs      Relevant Medications   losartan (COZAAR) 25 MG tablet   Hyperlipidemia    Encouraged heart healthy diet, increase exercise, avoid trans fats, consider a krill oil cap daily      Relevant Medications   losartan (COZAAR) 25 MG tablet   Hypertension    Well controlled, no changes to meds. Encouraged heart healthy diet such as the DASH diet and exercise as tolerated.       Relevant Medications   losartan (COZAAR) 25 MG tablet   Multiple myeloma ([facility]) (Chronic)    Per hematology          Follow-up: Return if symptoms worsen or fail to improve, for as scheduled.  Ann Held, DO

## 2020-05-10 DIAGNOSIS — H35713 Central serous chorioretinopathy, bilateral: Secondary | ICD-10-CM | POA: Diagnosis not present

## 2020-05-10 LAB — CLOSTRIDIUM DIFFICILE BY PCR: Toxigenic C. Difficile by PCR: POSITIVE — AB

## 2020-05-13 LAB — STOOL CULTURE
MICRO NUMBER:: 10657771
MICRO NUMBER:: 10657772
MICRO NUMBER:: 10657774
SHIGA RESULT:: NOT DETECTED
SPECIMEN QUALITY:: ADEQUATE
SPECIMEN QUALITY:: ADEQUATE
SPECIMEN QUALITY:: ADEQUATE

## 2020-05-13 LAB — FECAL LACTOFERRIN, QUANT
Fecal Lactoferrin: NEGATIVE
MICRO NUMBER:: 10657773
SPECIMEN QUALITY:: ADEQUATE

## 2020-05-16 DIAGNOSIS — G4733 Obstructive sleep apnea (adult) (pediatric): Secondary | ICD-10-CM | POA: Diagnosis not present

## 2020-05-20 DIAGNOSIS — G43909 Migraine, unspecified, not intractable, without status migrainosus: Secondary | ICD-10-CM | POA: Diagnosis not present

## 2020-05-20 DIAGNOSIS — R7303 Prediabetes: Secondary | ICD-10-CM | POA: Diagnosis not present

## 2020-05-20 DIAGNOSIS — Z20822 Contact with and (suspected) exposure to covid-19: Secondary | ICD-10-CM | POA: Diagnosis not present

## 2020-05-23 DIAGNOSIS — G43709 Chronic migraine without aura, not intractable, without status migrainosus: Secondary | ICD-10-CM | POA: Diagnosis not present

## 2020-05-23 DIAGNOSIS — G43019 Migraine without aura, intractable, without status migrainosus: Secondary | ICD-10-CM | POA: Diagnosis not present

## 2020-05-23 DIAGNOSIS — R519 Headache, unspecified: Secondary | ICD-10-CM | POA: Diagnosis not present

## 2020-05-24 DIAGNOSIS — I5042 Chronic combined systolic (congestive) and diastolic (congestive) heart failure: Secondary | ICD-10-CM | POA: Diagnosis not present

## 2020-05-30 DIAGNOSIS — J302 Other seasonal allergic rhinitis: Secondary | ICD-10-CM | POA: Diagnosis not present

## 2020-05-30 DIAGNOSIS — H6983 Other specified disorders of Eustachian tube, bilateral: Secondary | ICD-10-CM | POA: Diagnosis not present

## 2020-05-30 DIAGNOSIS — J0140 Acute pansinusitis, unspecified: Secondary | ICD-10-CM | POA: Diagnosis not present

## 2020-05-31 DIAGNOSIS — I5022 Chronic systolic (congestive) heart failure: Secondary | ICD-10-CM | POA: Diagnosis not present

## 2020-06-03 DIAGNOSIS — C9000 Multiple myeloma not having achieved remission: Secondary | ICD-10-CM | POA: Diagnosis not present

## 2020-06-03 DIAGNOSIS — I509 Heart failure, unspecified: Secondary | ICD-10-CM | POA: Diagnosis not present

## 2020-06-03 DIAGNOSIS — H9203 Otalgia, bilateral: Secondary | ICD-10-CM | POA: Diagnosis not present

## 2020-06-04 ENCOUNTER — Telehealth: Payer: Self-pay

## 2020-06-04 ENCOUNTER — Ambulatory Visit (INDEPENDENT_AMBULATORY_CARE_PROVIDER_SITE_OTHER): Payer: Medicare Other | Admitting: Family Medicine

## 2020-06-04 ENCOUNTER — Other Ambulatory Visit: Payer: Self-pay

## 2020-06-04 VITALS — BP 118/82 | HR 85 | Temp 97.9°F | Resp 12 | Ht 66.0 in | Wt 197.2 lb

## 2020-06-04 DIAGNOSIS — I10 Essential (primary) hypertension: Secondary | ICD-10-CM

## 2020-06-04 DIAGNOSIS — H6591 Unspecified nonsuppurative otitis media, right ear: Secondary | ICD-10-CM

## 2020-06-04 DIAGNOSIS — G43711 Chronic migraine without aura, intractable, with status migrainosus: Secondary | ICD-10-CM | POA: Diagnosis not present

## 2020-06-04 DIAGNOSIS — M542 Cervicalgia: Secondary | ICD-10-CM

## 2020-06-04 DIAGNOSIS — H9201 Otalgia, right ear: Secondary | ICD-10-CM | POA: Diagnosis not present

## 2020-06-04 DIAGNOSIS — E785 Hyperlipidemia, unspecified: Secondary | ICD-10-CM

## 2020-06-04 DIAGNOSIS — R739 Hyperglycemia, unspecified: Secondary | ICD-10-CM

## 2020-06-04 LAB — COMPREHENSIVE METABOLIC PANEL
ALT: 14 U/L (ref 0–35)
AST: 18 U/L (ref 0–37)
Albumin: 3.6 g/dL (ref 3.5–5.2)
Alkaline Phosphatase: 45 U/L (ref 39–117)
BUN: 17 mg/dL (ref 6–23)
CO2: 25 mEq/L (ref 19–32)
Calcium: 8.9 mg/dL (ref 8.4–10.5)
Chloride: 108 mEq/L (ref 96–112)
Creatinine, Ser: 0.67 mg/dL (ref 0.40–1.20)
GFR: 106.55 mL/min (ref >60.00)
Glucose, Bld: 101 mg/dL — ABNORMAL HIGH (ref 70–99)
Potassium: 3.7 mEq/L (ref 3.5–5.1)
Sodium: 140 mEq/L (ref 135–145)
Total Bilirubin: 0.3 mg/dL (ref 0.2–1.2)
Total Protein: 7.1 g/dL (ref 6.0–8.3)

## 2020-06-04 LAB — CBC WITH DIFFERENTIAL/PLATELET
Basophils Absolute: 0 10*3/uL (ref 0.0–0.1)
Basophils Relative: 1.1 % (ref 0.0–3.0)
Eosinophils Absolute: 0.1 10*3/uL (ref 0.0–0.7)
Eosinophils Relative: 1.9 % (ref 0.0–5.0)
HCT: 37.1 % (ref 36.0–46.0)
Hemoglobin: 12 g/dL (ref 12.0–15.0)
Lymphocytes Relative: 32.5 % (ref 12.0–46.0)
Lymphs Abs: 1.2 10*3/uL (ref 0.7–4.0)
MCHC: 32.5 g/dL (ref 30.0–36.0)
MCV: 89.5 fl (ref 78.0–100.0)
Monocytes Absolute: 0.3 10*3/uL (ref 0.1–1.0)
Monocytes Relative: 8.8 % (ref 3.0–12.0)
Neutro Abs: 2.1 10*3/uL (ref 1.4–7.7)
Neutrophils Relative %: 55.7 % (ref 43.0–77.0)
Platelets: 113 10*3/uL — ABNORMAL LOW (ref 150.0–400.0)
RBC: 4.15 Mil/uL (ref 3.87–5.11)
RDW: 15 % (ref 11.5–15.5)
WBC: 3.7 10*3/uL — ABNORMAL LOW (ref 4.0–10.5)

## 2020-06-04 LAB — MAGNESIUM: Magnesium: 1.7 mg/dL (ref 1.5–2.5)

## 2020-06-04 MED ORDER — BUTALBITAL-APAP-CAFF-COD 50-325-40-30 MG PO CAPS: 1.0000 | ORAL_CAPSULE | ORAL | 0 refills | Status: DC | PRN

## 2020-06-04 MED ORDER — CARVEDILOL 25 MG PO TABS: 25.0000 mg | ORAL_TABLET | Freq: Two times a day (BID) | ORAL | 1 refills | Status: DC

## 2020-06-04 NOTE — Patient Instructions (Addendum)
Patient needs phone number for [facility] to reschedule an appointment      Hypertension, Adult High blood pressure (hypertension) is when the force of blood pumping through the arteries is too strong. The arteries are the blood vessels that carry blood from the heart throughout the body. Hypertension forces the heart to work harder to pump blood and may cause arteries to become narrow or stiff. Untreated or uncontrolled hypertension can cause a heart attack, heart failure, a stroke, kidney disease, and other problems. A blood pressure reading consists of a higher number over a lower number. Ideally, your blood pressure should be below 120/80. The first ("top") number is called the systolic pressure. It is a measure of the pressure in your arteries as your heart beats. The second ("bottom") number is called the diastolic pressure. It is a measure of the pressure in your arteries as the heart relaxes. What are the causes? The exact cause of this condition is not known. There are some conditions that result in or are related to high blood pressure. What increases the risk? Some risk factors for high blood pressure are under your control. The following factors may make you more likely to develop this condition:  Smoking.  Having type 2 diabetes mellitus, high cholesterol, or both.  Not getting enough exercise or physical activity.  Being overweight.  Having too much fat, sugar, calories, or salt (sodium) in your diet.  Drinking too much alcohol. Some risk factors for high blood pressure may be difficult or impossible to change. Some of these factors include:  Having chronic kidney disease.  Having a family history of high blood pressure.  Age. Risk increases with age.  Race. You may be at higher risk if you are African American.  Gender. Men are at higher risk than women before age 38. After age 38, women are at higher risk than men.  Having obstructive sleep  apnea.  Stress. What are the signs or symptoms? High blood pressure may not cause symptoms. Very high blood pressure (hypertensive crisis) may cause:  Headache.  Anxiety.  Shortness of breath.  Nosebleed.  Nausea and vomiting.  Vision changes.  Severe chest pain.  Seizures. How is this diagnosed? This condition is diagnosed by measuring your blood pressure while you are seated, with your arm resting on a flat surface, your legs uncrossed, and your feet flat on the floor. The cuff of the blood pressure monitor will be placed directly against the skin of your upper arm at the level of your heart. It should be measured at least twice using the same arm. Certain conditions can cause a difference in blood pressure between your right and left arms. Certain factors can cause blood pressure readings to be lower or higher than normal for a short period of time:  When your blood pressure is higher when you are in a health care provider's office than when you are at home, this is called white coat hypertension. Most people with this condition do not need medicines.  When your blood pressure is higher at home than when you are in a health care provider's office, this is called masked hypertension. Most people with this condition may need medicines to control blood pressure. If you have a high blood pressure reading during one visit or you have normal blood pressure with other risk factors, you may be asked to:  Return on a different day to have your blood pressure checked again.  Monitor your blood pressure at home for  1 week or longer. If you are diagnosed with hypertension, you may have other blood or imaging tests to help your health care provider understand your overall risk for other conditions. How is this treated? This condition is treated by making healthy lifestyle changes, such as eating healthy foods, exercising more, and reducing your alcohol intake. Your health care provider may  prescribe medicine if lifestyle changes are not enough to get your blood pressure under control, and if:  Your systolic blood pressure is above 130.  Your diastolic blood pressure is above 80. Your personal target blood pressure may vary depending on your medical conditions, your age, and other factors. Follow these instructions at home: Eating and drinking   Eat a diet that is high in fiber and potassium, and low in sodium, added sugar, and fat. An example eating plan is called the DASH (Dietary Approaches to Stop Hypertension) diet. To eat this way: ? Eat plenty of fresh fruits and vegetables. Try to fill one half of your plate at each meal with fruits and vegetables. ? Eat whole grains, such as whole-wheat pasta, brown rice, or whole-grain bread. Fill about one fourth of your plate with whole grains. ? Eat or drink low-fat dairy products, such as skim milk or low-fat yogurt. ? Avoid fatty cuts of meat, processed or cured meats, and poultry with skin. Fill about one fourth of your plate with lean proteins, such as fish, chicken without skin, beans, eggs, or tofu. ? Avoid pre-made and processed foods. These tend to be higher in sodium, added sugar, and fat.  Reduce your daily sodium intake. Most people with hypertension should eat less than 1,500 mg of sodium a day.  Do not drink alcohol if: ? Your health care provider tells you not to drink. ? You are pregnant, may be pregnant, or are planning to become pregnant.  If you drink alcohol: ? Limit how much you use to:  0-1 drink a day for women.  0-2 drinks a day for men. ? Be aware of how much alcohol is in your drink. In the [place] one drink equals one 12 oz bottle of beer (355 mL), one 5 oz glass of wine (148 mL), or one 1 oz glass of hard liquor (44 mL). Lifestyle   Work with your health care provider to maintain a healthy body weight or to lose weight. Ask what an ideal weight is for you.  Get at least 30 minutes of exercise  most days of the week. Activities may include walking, swimming, or biking.  Include exercise to strengthen your muscles (resistance exercise), such as Pilates or lifting weights, as part of your weekly exercise routine. Try to do these types of exercises for 30 minutes at least 3 days a week.  Do not use any products that contain nicotine or tobacco, such as cigarettes, e-cigarettes, and chewing tobacco. If you need help quitting, ask your health care provider.  Monitor your blood pressure at home as told by your health care provider.  Keep all follow-up visits as told by your health care provider. This is important. Medicines  Take over-the-counter and prescription medicines only as told by your health care provider. Follow directions carefully. Blood pressure medicines must be taken as prescribed.  Do not skip doses of blood pressure medicine. Doing this puts you at risk for problems and can make the medicine less effective.  Ask your health care provider about side effects or reactions to medicines that you should watch for. Contact a  health care provider if you:  Think you are having a reaction to a medicine you are taking.  Have headaches that keep coming back (recurring).  Feel dizzy.  Have swelling in your ankles.  Have trouble with your vision. Get help right away if you:  Develop a severe headache or confusion.  Have unusual weakness or numbness.  Feel faint.  Have severe pain in your chest or abdomen.  Vomit repeatedly.  Have trouble breathing. Summary  Hypertension is when the force of blood pumping through your arteries is too strong. If this condition is not controlled, it may put you at risk for serious complications.  Your personal target blood pressure may vary depending on your medical conditions, your age, and other factors. For most people, a normal blood pressure is less than 120/80.  Hypertension is treated with lifestyle changes, medicines, or a  combination of both. Lifestyle changes include losing weight, eating a healthy, low-sodium diet, exercising more, and limiting alcohol. This information is not intended to replace advice given to you by your health care provider. Make sure you discuss any questions you have with your health care provider. Document Revised: 07/06/2018 Document Reviewed: 07/06/2018 Elsevier Patient Education  2020 [employer].

## 2020-06-04 NOTE — Telephone Encounter (Signed)
PA approved.  Request Reference Number: [id number]. BUT/APAP/CAF CAP CODEINE is approved through 11/08/2020. Your patient may now fill this prescription and it will be covered

## 2020-06-04 NOTE — Telephone Encounter (Signed)
PA initiated via Covermymeds; KEY: [id number]. Awaiting determination.

## 2020-06-05 DIAGNOSIS — H6591 Unspecified nonsuppurative otitis media, right ear: Secondary | ICD-10-CM | POA: Insufficient documentation

## 2020-06-05 DIAGNOSIS — H9201 Otalgia, right ear: Secondary | ICD-10-CM | POA: Insufficient documentation

## 2020-06-05 NOTE — Assessment & Plan Note (Signed)
Encouraged heart healthy diet, increase exercise, avoid trans fats, consider a krill oil cap daily 

## 2020-06-05 NOTE — Assessment & Plan Note (Signed)
hgba1c acceptable, minimize simple carbs. Increase exercise as tolerated.  

## 2020-06-05 NOTE — Assessment & Plan Note (Signed)
Well controlled, no changes to meds. Encouraged heart healthy diet such as the DASH diet and exercise as tolerated.  °

## 2020-06-05 NOTE — Assessment & Plan Note (Signed)
Encouraged moist heat and gentle stretching as tolerated. May try NSAIDs and prescription meds as directed and report if symptoms worsen or seek immediate care. Referred to physical therapy for possible dry needling and treatment.

## 2020-06-05 NOTE — Assessment & Plan Note (Signed)
She notes Fiorecet with codeine is helpful at times. Encouraged increased hydration, 64 ounces of clear fluids daily. Minimize alcohol and caffeine. Eat small frequent meals with lean proteins and complex carbs. Avoid high and low blood sugars. Get adequate sleep, 7-8 hours a night. Needs exercise daily preferably in the morning. Refills given on medicine

## 2020-06-05 NOTE — Assessment & Plan Note (Signed)
Pain is persistent, she is referred to ENT for evalaution

## 2020-06-05 NOTE — Progress Notes (Signed)
Subjective:    [name], female    DOB: 1956-04-02, 64 y.o.   MRN: [id number]  Chief Complaint  Patient presents with  . Follow-up    HPI Patient is in today for follow up and is noting several concerns. She is having persistent right ear pain. No recent febrile illness or hospitalizations.  She continues to struggle with headaches and has found Fiorecet helpful in the past. She has trouble with neck pain most notably on the right. No fall or trauma. Denies CP/palp/SOB/HA/congestion/fevers/GI or GU c/o. Taking meds as prescribed  Past Medical History:  Diagnosis Date  . Abnormal SPEP 07/26/2016  . Arthritis    knees, hands  . Back pain 07/14/2016  . Bowel obstruction ([place]) 11/2014  . C. difficile diarrhea   . CHF (congestive heart failure) ([place])   . Colitis   . Congestive heart failure ([place]) 02/24/2015   S/p pacemaker  . Depression   . Elevated sed rate 08/15/2017  . Epigastric pain 03/22/2017  . GERD (gastroesophageal reflux disease)   . Heart disease   . Hyperlipidemia   . Hypertension   . 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 Low back pain 07/14/2016  . 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 Multiple myeloma ([place]) 07/20/2018  . Multiple myeloma not having achieved remission ([place]) 07/20/2018  . Myalgia 12/31/2016  . Obstructive sleep apnea 03/31/2015  . SOB (shortness of breath) 04/09/2016  . Stroke ([place])    TIAs  . UTI (urinary tract infection)     Past Surgical History:  Procedure Laterality Date  . ABDOMINAL HYSTERECTOMY     menorraghia, 2006, total  . CHOLECYSTECTOMY    . COLONOSCOPY  2018   [facility] per patient  . ESOPHAGOGASTRODUODENOSCOPY  2018   [facility]   . KNEE SURGERY     right, repair torn torn cartialage  . PACEMAKER INSERTION  08/2014  . TONSILLECTOMY    . TUBAL LIGATION      Family History  Problem Relation Age of Onset  . Diabetes Mother   . Hypertension Mother   . Heart  disease Mother        s/p 1 stent  . Hyperlipidemia Mother   . Arthritis Mother   . Cancer Father        COLON  . Colon cancer Father 73  . Irritable bowel syndrome Sister   . Hyperlipidemia Daughter   . Hypertension Daughter   . Hypertension Maternal Grandmother   . Arthritis Maternal Grandmother   . Heart disease Maternal Grandfather        MI  . Hypertension Maternal Grandfather   . Arthritis Maternal Grandfather   . Hypertension Son   . Esophageal cancer Neg Hx     Social History   Socioeconomic History  . Marital status: Divorced    Spouse name: [name]  . Number of children: 3  . Years of education: [name]  . Highest education level: High school graduate  Occupational History  . [name]  Tobacco Use  . Smoking status: Never Smoker  . Smokeless tobacco: Never Used  Vaping Use  . Vaping Use: Never used  Substance and Sexual Activity  . Alcohol use: No  . Drug use: No  . Sexual activity: [name]  Other Topics Concern  . [name]  Social History Narrative   Lives at home alone   Retired   Caffeine: coffee   Social Determinants of  Health   Financial Resource Strain:   . Difficulty of Paying Living Expenses:   Food Insecurity:   . Worried About Charity fundraiser in the Last Year:   . Arboriculturist in the Last Year:   Transportation Needs:   . Film/video editor (Medical):   Marland Kitchen Lack of Transportation (Non-Medical):   Physical Activity:   . Days of Exercise per Week:   . Minutes of Exercise per Session:   Stress:   . Feeling of Stress :   Social Connections:   . Frequency of Communication with Friends and Family:   . Frequency of Social Gatherings with Friends and Family:   . Attends Religious Services:   . Active Member of Clubs or Organizations:   . Attends Archivist Meetings:   Marland Kitchen Marital Status:   Intimate Partner Violence:   . Fear of Current or Ex-Partner:   . Emotionally Abused:   Marland Kitchen Physically Abused:     . Sexually Abused:     Outpatient Medications Prior to Visit  Medication Sig Dispense Refill  . albuterol (PROVENTIL HFA;VENTOLIN HFA) 108 (90 Base) MCG/ACT inhaler Inhale 2 puffs into the lungs every 6 (six) hours as needed for wheezing or shortness of breath. 1 Inhaler 2  . aspirin EC 81 MG tablet Take 1 tablet (81 mg total) by mouth daily. 90 tablet 1  . Cholecalciferol (VITAMIN D-3) 25 MCG (1000 UT) CAPS Take 1 capsule (1,000 Units total) by mouth daily. 60 capsule 2  . cyclobenzaprine (FLEXERIL) 10 MG tablet Take 1 tablet (10 mg total) by mouth at bedtime. 7 tablet 0  . losartan (COZAAR) 25 MG tablet Take by mouth.    . magnesium oxide (MAG-OX) 400 MG tablet Take 1 tablet (400 mg total) by mouth 2 (two) times daily. 60 tablet 1  . meclizine (ANTIVERT) 12.5 MG tablet Take 12.5 mg by mouth 3 (three) times daily.    . melatonin 3 MG TABS tablet Take 3 mg by mouth at bedtime.    . Multiple Vitamins-Minerals (MULTIVITAMIN ADULT PO) Take 1 tablet by mouth daily.     . pantoprazole (PROTONIX) 40 MG tablet Take 1 tablet (40 mg total) by mouth 2 (two) times daily. 180 tablet 1  . spironolactone (ALDACTONE) 25 MG tablet Take 1 tablet by mouth daily.    Marland Kitchen topiramate (TOPAMAX) 50 MG tablet Take 1 tablet (50 mg total) by mouth 2 (two) times daily. 60 tablet 3  . carvedilol (COREG) 25 MG tablet Take 1 tablet (25 mg total) by mouth 2 (two) times daily with a meal. 180 tablet 1  . conjugated estrogens (PREMARIN) vaginal cream Place 1 Applicatorful vaginally as needed.  (Patient not taking: Reported on 06/04/2020)    . fluticasone (FLONASE) 50 MCG/ACT nasal spray Place 2 sprays into both nostrils daily. (Patient not taking: Reported on 06/04/2020) 16 g 0  . hydrOXYzine (ATARAX/VISTARIL) 10 MG tablet Take 1 tablet (10 mg total) by mouth 3 (three) times daily as needed for itching. (Patient not taking: Reported on 06/04/2020) 30 tablet 0  . metoCLOPramide (REGLAN) 10 MG tablet Take 1 tablet (10 mg total) by  mouth 4 (four) times daily. For migraine or headache or nausea (Patient not taking: Reported on 06/04/2020) 30 tablet 6  . montelukast (SINGULAIR) 10 MG tablet Take 1 tablet (10 mg total) by mouth at bedtime. Start taking Singulair on 11/16/2019. (Patient not taking: Reported on 06/04/2020) 20 tablet 0  . Naproxen 375 MG TBEC Take  1 tablet (375 mg total) by mouth every morning. (Patient not taking: Reported on 06/04/2020) 30 tablet 2  . ondansetron (ZOFRAN) 4 MG tablet Take 1 tablet (4 mg total) by mouth every 8 (eight) hours as needed for nausea or vomiting. (Patient not taking: Reported on 06/04/2020) 30 tablet 1  . potassium chloride SA (KLOR-CON) 20 MEQ tablet Take 1 tablet (20 mEq total) by mouth daily. (Patient not taking: Reported on 06/04/2020) 30 tablet 3  . prochlorperazine (COMPAZINE) 10 MG tablet Take 1 tablet (10 mg total) by mouth every 6 (six) hours as needed (Nausea or vomiting). (Patient not taking: Reported on 06/04/2020) 30 tablet 1  . promethazine (PHENERGAN) 25 MG tablet Take 1 tablet (25 mg total) by mouth every 8 (eight) hours as needed for nausea or vomiting. (Patient not taking: Reported on 06/04/2020) 30 tablet 2  . acetaminophen (TYLENOL) 325 MG tablet Take 650 mg by mouth as needed.     . butalbital-acetaminophen-caffeine (FIORICET WITH CODEINE) 50-325-40-30 MG capsule Take 1 capsule by mouth every 4 (four) hours as needed for headache. (Patient not taking: Reported on 06/04/2020)    . cetirizine (ZYRTEC) 10 MG tablet Take 10 mg by mouth daily.    . ferrous sulfate (FEROSUL) 325 (65 FE) MG tablet Take 325 mg by mouth daily with breakfast.    . furosemide (LASIX) 20 MG tablet Take 1 tablet (20 mg total) by mouth daily as needed for fluid or edema. 30 tablet 2  . triamterene-hydrochlorothiazide (MAXZIDE-25) 37.5-25 MG tablet Take 1 tablet by mouth daily.     No facility-administered medications prior to visit.    Allergies  Allergen Reactions  . Benazepril Anaphylaxis, Swelling  and Hives    angioedema Throat and lip swelling  . Ondansetron Hcl Hives    Redness and hives post IV admin on 07/05/17  . Codeine Nausea And Vomiting  . Morphine Hives    Redness and hives noted post IV admin on 07/05/17    Review of Systems  Constitutional: Positive for malaise/fatigue. Negative for fever.  HENT: Positive for ear pain. Negative for congestion and hearing loss.   Eyes: Negative for blurred vision.  Respiratory: Negative for shortness of breath.   Cardiovascular: Negative for chest pain, palpitations and leg swelling.  Gastrointestinal: Negative for abdominal pain, blood in stool and nausea.  Genitourinary: Negative for dysuria and frequency.  Musculoskeletal: Positive for back pain and myalgias. Negative for falls.  Skin: Negative for rash.  Neurological: Negative for dizziness, loss of consciousness and headaches.  Endo/Heme/Allergies: Negative for environmental allergies.  Psychiatric/Behavioral: Negative for depression. The patient is not nervous/anxious.        Objective:    Physical Exam Vitals and nursing note reviewed.  Constitutional:      General: She is not in acute distress.    Appearance: She is well-developed.  HENT:     Head: Normocephalic and atraumatic.     Left Ear: Tympanic membrane normal.     Ears:     Comments: Right TM is dull and retracted.     Nose: Nose normal.  Eyes:     General:        Right eye: No discharge.        Left eye: No discharge.  Cardiovascular:     Rate and Rhythm: Normal rate and regular rhythm.     Heart sounds: No murmur heard.   Pulmonary:     Effort: Pulmonary effort is normal.     Breath sounds: Normal  breath sounds.  Abdominal:     General: Bowel sounds are normal.     Palpations: Abdomen is soft.     Tenderness: There is no abdominal tenderness.  Musculoskeletal:     Cervical back: Normal range of motion and neck supple.  Skin:    General: Skin is warm and dry.  Neurological:     Mental  Status: She is alert and oriented to person, place, and time.     BP 118/82 (BP Location: Right Arm, Cuff Size: Large)   Pulse 85   Temp 97.9 F (36.6 C) (Oral)   Resp 12   Ht 5' 6" (1.676 m)   Wt 197 lb 3.2 oz (89.4 kg)   SpO2 97%   BMI 31.83 kg/m  Wt Readings from Last 3 Encounters:  06/04/20 197 lb 3.2 oz (89.4 kg)  05/09/20 196 lb 12.8 oz (89.3 kg)  04/25/20 194 lb (88 kg)    Diabetic Foot Exam - Simple   No data filed     Lab Results  Component Value Date   WBC 3.7 (L) 06/04/2020   HGB 12.0 06/04/2020   HCT 37.1 06/04/2020   PLT 113.0 (L) 06/04/2020   GLUCOSE 101 (H) 06/04/2020   CHOL 193 01/16/2020   TRIG 120.0 01/16/2020   HDL 52.90 01/16/2020   LDLCALC 116 (H) 01/16/2020   ALT 14 06/04/2020   AST 18 06/04/2020   NA 140 06/04/2020   K 3.7 06/04/2020   CL 108 06/04/2020   CREATININE 0.67 06/04/2020   BUN 17 06/04/2020   CO2 25 06/04/2020   TSH 1.29 01/16/2020   INR 0.90 07/28/2018   HGBA1C 5.9 01/16/2020    Lab Results  Component Value Date   TSH 1.29 01/16/2020   Lab Results  Component Value Date   WBC 3.7 (L) 06/04/2020   HGB 12.0 06/04/2020   HCT 37.1 06/04/2020   MCV 89.5 06/04/2020   PLT 113.0 (L) 06/04/2020   Lab Results  Component Value Date   NA 140 06/04/2020   K 3.7 06/04/2020   CHLORIDE 108 12/31/2016   CO2 25 06/04/2020   GLUCOSE 101 (H) 06/04/2020   BUN 17 06/04/2020   CREATININE 0.67 06/04/2020   BILITOT 0.3 06/04/2020   ALKPHOS 45 06/04/2020   AST 18 06/04/2020   ALT 14 06/04/2020   PROT 7.1 06/04/2020   ALBUMIN 3.6 06/04/2020   CALCIUM 8.9 06/04/2020   ANIONGAP 7 04/25/2020   EGFR >90 12/31/2016   GFR 106.55 06/04/2020   Lab Results  Component Value Date   CHOL 193 01/16/2020   Lab Results  Component Value Date   HDL 52.90 01/16/2020   Lab Results  Component Value Date   LDLCALC 116 (H) 01/16/2020   Lab Results  Component Value Date   TRIG 120.0 01/16/2020   Lab Results  Component Value Date    CHOLHDL 4 01/16/2020   Lab Results  Component Value Date   HGBA1C 5.9 01/16/2020       Assessment & Plan:   Problem List Items Addressed This Visit    Hyperlipidemia    Encouraged heart healthy diet, increase exercise, avoid trans fats, consider a krill oil cap daily      Relevant Medications   spironolactone (ALDACTONE) 25 MG tablet   carvedilol (COREG) 25 MG tablet   Hypertension - Primary    Well controlled, no changes to meds. Encouraged heart healthy diet such as the DASH diet and exercise as tolerated.  Relevant Medications   spironolactone (ALDACTONE) 25 MG tablet   carvedilol (COREG) 25 MG tablet   Other Relevant Orders   Magnesium (Completed)   Comprehensive metabolic panel (Completed)   CBC w/Diff (Completed)   Neck pain   Relevant Orders   Ambulatory referral to Physical Therapy   Chronic migraine without aura, with intractable migraine, so stated, with status migrainosus    She notes Fiorecet with codeine is helpful at times. Encouraged increased hydration, 64 ounces of clear fluids daily. Minimize alcohol and caffeine. Eat small frequent meals with lean proteins and complex carbs. Avoid high and low blood sugars. Get adequate sleep, 7-8 hours a night. Needs exercise daily preferably in the morning. Refills given on medicine      Relevant Medications   spironolactone (ALDACTONE) 25 MG tablet   carvedilol (COREG) 25 MG tablet   butalbital-acetaminophen-caffeine (FIORICET WITH CODEINE) 50-325-40-30 MG capsule   Hyperglycemia    hgba1c acceptable, minimize simple carbs. Increase exercise as tolerated.       Right ear pain    Pain is persistent, she is referred to ENT for evalaution      Relevant Orders   Ambulatory referral to ENT   Right otitis media with effusion    flonase and nasal saline followed by valsalva maneuver.       Relevant Orders   Ambulatory referral to ENT      I have discontinued Lannette Donath acetaminophen, furosemide,  triamterene-hydrochlorothiazide, cetirizine, and ferrous sulfate. I am also having her maintain her conjugated estrogens, pantoprazole, aspirin EC, ondansetron, albuterol, Multiple Vitamins-Minerals (MULTIVITAMIN ADULT PO), topiramate, metoCLOPramide, promethazine, montelukast, prochlorperazine, fluticasone, cyclobenzaprine, meclizine, Naproxen, magnesium oxide, potassium chloride SA, hydrOXYzine, Vitamin D-3, losartan, spironolactone, melatonin, carvedilol, and butalbital-acetaminophen-caffeine.  Meds ordered this encounter  Medications  . carvedilol (COREG) 25 MG tablet    Sig: Take 1 tablet (25 mg total) by mouth 2 (two) times daily with a meal.    Dispense:  180 tablet    Refill:  1  . butalbital-acetaminophen-caffeine (FIORICET WITH CODEINE) 50-325-40-30 MG capsule    Sig: Take 1 capsule by mouth every 4 (four) hours as needed for headache.    Dispense:  30 capsule    Refill:  0     Penni Homans, MD

## 2020-06-05 NOTE — Assessment & Plan Note (Signed)
flonase and nasal saline followed by valsalva maneuver.

## 2020-06-06 ENCOUNTER — Telehealth: Payer: Self-pay

## 2020-06-06 DIAGNOSIS — H35712 Central serous chorioretinopathy, left eye: Secondary | ICD-10-CM | POA: Diagnosis not present

## 2020-06-06 NOTE — Telephone Encounter (Signed)
Pt called stating she needs her magnesium bloodwork results sent over to her Cardiologist, Dr. Ola Spurr and his PA, Ebony Hail (fax # [phone]).  She stated this is very important since her medication dosage has changed from 2x a day to 3x a day and he needs to be aware of the change in her magnesium levels asap.

## 2020-06-06 NOTE — Telephone Encounter (Signed)
Results faxed to number provided.

## 2020-06-14 DIAGNOSIS — Z95810 Presence of automatic (implantable) cardiac defibrillator: Secondary | ICD-10-CM | POA: Diagnosis not present

## 2020-06-24 ENCOUNTER — Other Ambulatory Visit: Payer: Self-pay

## 2020-06-24 ENCOUNTER — Ambulatory Visit (INDEPENDENT_AMBULATORY_CARE_PROVIDER_SITE_OTHER): Payer: Medicare Other | Admitting: Medical

## 2020-06-24 VITALS — BP 154/93 | HR 90 | Resp 16 | Ht 66.0 in | Wt 201.8 lb

## 2020-06-24 DIAGNOSIS — J029 Acute pharyngitis, unspecified: Secondary | ICD-10-CM

## 2020-06-24 DIAGNOSIS — I5022 Chronic systolic (congestive) heart failure: Secondary | ICD-10-CM | POA: Diagnosis not present

## 2020-06-24 LAB — POCT RAPID STREP A (OFFICE): Rapid Strep A Screen: NEGATIVE

## 2020-06-24 MED ORDER — AZITHROMYCIN 250 MG PO TABS
ORAL_TABLET | ORAL | 0 refills | Status: DC
Start: 2020-06-24 — End: 2020-07-26

## 2020-06-24 NOTE — Patient Instructions (Addendum)
Your rapid strep test came back negative but your exam indicates possible strep. You have mild red throat, enlarged lymph node and mild neck soreness.  Will prescribe azithromycin antibiotic and see how you respond.  Keep hydrated.  If symptoms worsen or change despite the above treatment then would recommend covid testing. Presently not suspicious.  Can use tylenol and flexeril for neck pain.  Follow up in 7-10 days or as needed.

## 2020-06-24 NOTE — Progress Notes (Signed)
Subjective:    [name], female    DOB: 11/06/1956, 64 y.o.   MRN: [id number]  HPI  Pt in for follow up.  Pt states her ha's have been less since she she has been getting treatment for her eyes/vision. Specialist told her this would help her ha. She states that has been in the case.   Pt state recent moderate st, scratchy throat, mild rt side submandibular node tender.  Mild rt trapezius tender. These symptoms present since Friday.  No nuccal rigidity, no nausea, no vomiting and no ha.  Pt grandaughter sick with mild allergy symptoms.  Pt has been vaccinated against covid.    Review of Systems  Constitutional: Negative for chills, fatigue and fever.  HENT: Positive for sore throat. Negative for congestion.   Respiratory: Negative for chest tightness, shortness of breath and wheezing.   Cardiovascular: Negative for chest pain and palpitations.  Gastrointestinal: Negative for abdominal pain.  Musculoskeletal: Negative for back pain.  Skin: Negative for rash.  Hematological: Positive for adenopathy. Does not bruise/bleed easily.  Psychiatric/Behavioral: Negative for behavioral problems.    Past Medical History:  Diagnosis Date  . Abnormal SPEP 07/26/2016  . Arthritis    knees, hands  . Back pain 07/14/2016  . Bowel obstruction ([place]) 11/2014  . C. difficile diarrhea   . CHF (congestive heart failure) ([place])   . Colitis   . Congestive heart failure ([place]) 02/24/2015   S/p pacemaker  . Depression   . Elevated sed rate 08/15/2017  . Epigastric pain 03/22/2017  . GERD (gastroesophageal reflux disease)   . Heart disease   . Hyperlipidemia   . Hypertension   . 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 Low back pain 07/14/2016  . 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 Multiple myeloma ([place]) 07/20/2018  . Multiple myeloma not having achieved remission ([place]) 07/20/2018  . Myalgia 12/31/2016  . Obstructive sleep apnea 03/31/2015  . SOB  (shortness of breath) 04/09/2016  . Stroke ([place])    TIAs  . UTI (urinary tract infection)      Social History   Socioeconomic History  . Marital status: Divorced    Spouse name: [name]  . Number of children: 3  . Years of education: [name]  . Highest education level: High school graduate  Occupational History  . [name]  Tobacco Use  . Smoking status: Never Smoker  . Smokeless tobacco: Never Used  Vaping Use  . Vaping Use: Never used  Substance and Sexual Activity  . Alcohol use: No  . Drug use: No  . Sexual activity: [name]  Other Topics Concern  . [name]  Social History Narrative   Lives at home alone   Retired   Caffeine: coffee   Social Determinants of Health   Financial Resource Strain:   . Difficulty of Paying Living Expenses:   Food Insecurity:   . Worried About Charity fundraiser in the Last Year:   . Arboriculturist in the Last Year:   Transportation Needs:   . Film/video editor (Medical):   Marland Kitchen Lack of Transportation (Non-Medical):   Physical Activity:   . Days of Exercise per Week:   . Minutes of Exercise per Session:   Stress:   . Feeling of Stress :   Social Connections:   . Frequency of Communication with Friends and Family:   . Frequency of Social Gatherings with Friends and  Family:   . Attends Religious Services:   . Active Member of Clubs or Organizations:   . Attends Archivist Meetings:   Marland Kitchen Marital Status:   Intimate Partner Violence:   . Fear of Current or Ex-Partner:   . Emotionally Abused:   Marland Kitchen Physically Abused:   . Sexually Abused:     Past Surgical History:  Procedure Laterality Date  . ABDOMINAL HYSTERECTOMY     menorraghia, 2006, total  . CHOLECYSTECTOMY    . COLONOSCOPY  2018   [facility] per patient  . ESOPHAGOGASTRODUODENOSCOPY  2018   [facility]   . KNEE SURGERY     right, repair torn torn cartialage  . PACEMAKER INSERTION  08/2014  . TONSILLECTOMY    .  TUBAL LIGATION      Family History  Problem Relation Age of Onset  . Diabetes Mother   . Hypertension Mother   . Heart disease Mother        s/p 1 stent  . Hyperlipidemia Mother   . Arthritis Mother   . Cancer Father        COLON  . Colon cancer Father 46  . Irritable bowel syndrome Sister   . Hyperlipidemia Daughter   . Hypertension Daughter   . Hypertension Maternal Grandmother   . Arthritis Maternal Grandmother   . Heart disease Maternal Grandfather        MI  . Hypertension Maternal Grandfather   . Arthritis Maternal Grandfather   . Hypertension Son   . Esophageal cancer Neg Hx     Allergies  Allergen Reactions  . Benazepril Anaphylaxis, Swelling and Hives    angioedema Throat and lip swelling  . Ondansetron Hcl Hives    Redness and hives post IV admin on 07/05/17  . Codeine Nausea And Vomiting  . Morphine Hives    Redness and hives noted post IV admin on 07/05/17    Current Outpatient Medications on File Prior to Visit  Medication Sig Dispense Refill  . albuterol (PROVENTIL HFA;VENTOLIN HFA) 108 (90 Base) MCG/ACT inhaler Inhale 2 puffs into the lungs every 6 (six) hours as needed for wheezing or shortness of breath. 1 Inhaler 2  . aspirin EC 81 MG tablet Take 1 tablet (81 mg total) by mouth daily. 90 tablet 1  . butalbital-acetaminophen-caffeine (FIORICET WITH CODEINE) 50-325-40-30 MG capsule Take 1 capsule by mouth every 4 (four) hours as needed for headache. 30 capsule 0  . carvedilol (COREG) 25 MG tablet Take 1 tablet (25 mg total) by mouth 2 (two) times daily with a meal. 180 tablet 1  . Cholecalciferol (VITAMIN D-3) 25 MCG (1000 UT) CAPS Take 1 capsule (1,000 Units total) by mouth daily. 60 capsule 2  . conjugated estrogens (PREMARIN) vaginal cream Place 1 Applicatorful vaginally as needed.  (Patient not taking: Reported on 06/04/2020)    . cyclobenzaprine (FLEXERIL) 10 MG tablet Take 1 tablet (10 mg total) by mouth at bedtime. 7 tablet 0  . fluticasone  (FLONASE) 50 MCG/ACT nasal spray Place 2 sprays into both nostrils daily. (Patient not taking: Reported on 06/04/2020) 16 g 0  . hydrOXYzine (ATARAX/VISTARIL) 10 MG tablet Take 1 tablet (10 mg total) by mouth 3 (three) times daily as needed for itching. (Patient not taking: Reported on 06/04/2020) 30 tablet 0  . losartan (COZAAR) 25 MG tablet Take by mouth.    . magnesium oxide (MAG-OX) 400 MG tablet Take 1 tablet (400 mg total) by mouth 2 (two) times daily. 60 tablet 1  .  meclizine (ANTIVERT) 12.5 MG tablet Take 12.5 mg by mouth 3 (three) times daily.    . melatonin 3 MG TABS tablet Take 3 mg by mouth at bedtime.    . metoCLOPramide (REGLAN) 10 MG tablet Take 1 tablet (10 mg total) by mouth 4 (four) times daily. For migraine or headache or nausea (Patient not taking: Reported on 06/04/2020) 30 tablet 6  . montelukast (SINGULAIR) 10 MG tablet Take 1 tablet (10 mg total) by mouth at bedtime. Start taking Singulair on 11/16/2019. (Patient not taking: Reported on 06/04/2020) 20 tablet 0  . Multiple Vitamins-Minerals (MULTIVITAMIN ADULT PO) Take 1 tablet by mouth daily.     . Naproxen 375 MG TBEC Take 1 tablet (375 mg total) by mouth every morning. (Patient not taking: Reported on 06/04/2020) 30 tablet 2  . ondansetron (ZOFRAN) 4 MG tablet Take 1 tablet (4 mg total) by mouth every 8 (eight) hours as needed for nausea or vomiting. (Patient not taking: Reported on 06/04/2020) 30 tablet 1  . pantoprazole (PROTONIX) 40 MG tablet Take 1 tablet (40 mg total) by mouth 2 (two) times daily. 180 tablet 1  . potassium chloride SA (KLOR-CON) 20 MEQ tablet Take 1 tablet (20 mEq total) by mouth daily. (Patient not taking: Reported on 06/04/2020) 30 tablet 3  . prochlorperazine (COMPAZINE) 10 MG tablet Take 1 tablet (10 mg total) by mouth every 6 (six) hours as needed (Nausea or vomiting). (Patient not taking: Reported on 06/04/2020) 30 tablet 1  . promethazine (PHENERGAN) 25 MG tablet Take 1 tablet (25 mg total) by mouth every  8 (eight) hours as needed for nausea or vomiting. (Patient not taking: Reported on 06/04/2020) 30 tablet 2  . spironolactone (ALDACTONE) 25 MG tablet Take 1 tablet by mouth daily.    Marland Kitchen topiramate (TOPAMAX) 50 MG tablet Take 1 tablet (50 mg total) by mouth 2 (two) times daily. 60 tablet 3   No current facility-administered medications on file prior to visit.    BP (!) 154/93 (BP Location: Left Arm, Patient Position: Sitting, Cuff Size: Large)   Pulse 90   Resp 16   Ht '5\' 6"'$  (1.676 m)   Wt 201 lb 12.8 oz (91.5 kg)   SpO2 97%   BMI 32.57 kg/m       Objective:   Physical Exam  General  Mental Status - Alert. General Appearance - Well groomed. Not in acute distress.  Skin Rashes- No Rashes.  HEENT Head- Normal. Ear Auditory Canal - Left- Normal. Right - Normal.Tympanic Membrane- Left- Normal. Right- Normal. Eye Sclera/Conjunctiva- Left- Normal. Right- Normal. Nose & Sinuses Nasal Mucosa- Left-  Boggy and Congested. Right-  Boggy and  Congested.Bilateral maxillary and frontal sinus pressure. Mouth & Throat Lips: Upper Lip- Normal: no dryness, cracking, pallor, cyanosis, or vesicular eruption. Lower Lip-Normal: no dryness, cracking, pallor, cyanosis or vesicular eruption. Buccal Mucosa- Bilateral- No Aphthous ulcers. Oropharynx- No Discharge or Erythema. Tonsils: Characteristics- Bilateral- mild  Erythema. Size/Enlargement- Bilateral- No enlargement. Discharge- bilateral-None.  Neck Neck- Supple. No Masses. Slight rt side trapezius tenderness. No neck stiffness.   Chest and Lung Exam Auscultation: Breath Sounds:-Clear even and unlabored.  Cardiovascular Auscultation:Rythm- Regular, rate and rhythm. Murmurs & Other Heart Sounds:Ausculatation of the heart reveal- No Murmurs.  Lymphatic Head & Neck General Head & Neck Lymphatics: Bilateral: Description- No Localized lymphadenopathy.       Assessment & Plan:  Your rapid strep test came back negative but your exam  indicates possible strep. You have mild red throat, enlarged  lymph node and mild neck soreness.  Will prescribe azithromycin antibiotic and see how you respond.  Keep hydrated.  If symptoms worsen or change despite the above treatment then would recommend covid testing. Presently not suspicious.  Can use tylenol and flexeril for neck pain.  Follow up in 7-10 days or as needed.  Time spent with patient today was 25  minutes which consisted of chart revdiew, discussing diagnosis, work up treatment and documentation.

## 2020-06-26 DIAGNOSIS — I5022 Chronic systolic (congestive) heart failure: Secondary | ICD-10-CM | POA: Diagnosis not present

## 2020-06-27 DIAGNOSIS — I5022 Chronic systolic (congestive) heart failure: Secondary | ICD-10-CM | POA: Diagnosis not present

## 2020-06-28 DIAGNOSIS — R05 Cough: Secondary | ICD-10-CM | POA: Diagnosis not present

## 2020-06-28 DIAGNOSIS — J019 Acute sinusitis, unspecified: Secondary | ICD-10-CM | POA: Diagnosis not present

## 2020-07-01 DIAGNOSIS — R0981 Nasal congestion: Secondary | ICD-10-CM | POA: Diagnosis not present

## 2020-07-01 DIAGNOSIS — J301 Allergic rhinitis due to pollen: Secondary | ICD-10-CM | POA: Diagnosis not present

## 2020-07-01 DIAGNOSIS — I110 Hypertensive heart disease with heart failure: Secondary | ICD-10-CM | POA: Diagnosis not present

## 2020-07-01 DIAGNOSIS — J329 Chronic sinusitis, unspecified: Secondary | ICD-10-CM | POA: Diagnosis not present

## 2020-07-01 DIAGNOSIS — G4733 Obstructive sleep apnea (adult) (pediatric): Secondary | ICD-10-CM | POA: Diagnosis not present

## 2020-07-01 DIAGNOSIS — R030 Elevated blood-pressure reading, without diagnosis of hypertension: Secondary | ICD-10-CM | POA: Diagnosis not present

## 2020-07-01 DIAGNOSIS — R5383 Other fatigue: Secondary | ICD-10-CM | POA: Diagnosis not present

## 2020-07-01 DIAGNOSIS — Z8679 Personal history of other diseases of the circulatory system: Secondary | ICD-10-CM | POA: Diagnosis not present

## 2020-07-01 DIAGNOSIS — I5042 Chronic combined systolic (congestive) and diastolic (congestive) heart failure: Secondary | ICD-10-CM | POA: Diagnosis not present

## 2020-07-01 DIAGNOSIS — Z95810 Presence of automatic (implantable) cardiac defibrillator: Secondary | ICD-10-CM | POA: Diagnosis not present

## 2020-07-02 ENCOUNTER — Encounter: Payer: Self-pay | Admitting: *Deleted

## 2020-07-03 DIAGNOSIS — I5022 Chronic systolic (congestive) heart failure: Secondary | ICD-10-CM | POA: Diagnosis not present

## 2020-07-08 DIAGNOSIS — D509 Iron deficiency anemia, unspecified: Secondary | ICD-10-CM | POA: Diagnosis not present

## 2020-07-10 ENCOUNTER — Ambulatory Visit: Payer: Medicare Other | Admitting: Medical

## 2020-07-10 DIAGNOSIS — I5022 Chronic systolic (congestive) heart failure: Secondary | ICD-10-CM | POA: Diagnosis not present

## 2020-07-11 DIAGNOSIS — I5022 Chronic systolic (congestive) heart failure: Secondary | ICD-10-CM | POA: Diagnosis not present

## 2020-07-16 DIAGNOSIS — D509 Iron deficiency anemia, unspecified: Secondary | ICD-10-CM | POA: Diagnosis not present

## 2020-07-17 DIAGNOSIS — I5022 Chronic systolic (congestive) heart failure: Secondary | ICD-10-CM | POA: Diagnosis not present

## 2020-07-18 DIAGNOSIS — I5022 Chronic systolic (congestive) heart failure: Secondary | ICD-10-CM | POA: Diagnosis not present

## 2020-07-19 DIAGNOSIS — A499 Bacterial infection, unspecified: Secondary | ICD-10-CM | POA: Diagnosis not present

## 2020-07-19 DIAGNOSIS — E611 Iron deficiency: Secondary | ICD-10-CM | POA: Diagnosis not present

## 2020-07-19 DIAGNOSIS — N390 Urinary tract infection, site not specified: Secondary | ICD-10-CM | POA: Diagnosis not present

## 2020-07-24 DIAGNOSIS — I5022 Chronic systolic (congestive) heart failure: Secondary | ICD-10-CM | POA: Diagnosis not present

## 2020-07-25 DIAGNOSIS — I5022 Chronic systolic (congestive) heart failure: Secondary | ICD-10-CM | POA: Diagnosis not present

## 2020-07-26 ENCOUNTER — Encounter: Payer: Self-pay | Admitting: Hematology & Oncology

## 2020-07-26 ENCOUNTER — Other Ambulatory Visit: Payer: Self-pay

## 2020-07-26 ENCOUNTER — Inpatient Hospital Stay: Payer: Medicare Other | Attending: Hematology & Oncology

## 2020-07-26 ENCOUNTER — Inpatient Hospital Stay (HOSPITAL_BASED_OUTPATIENT_CLINIC_OR_DEPARTMENT_OTHER): Payer: Medicare Other | Admitting: Hematology & Oncology

## 2020-07-26 VITALS — BP 137/83 | HR 92 | Temp 97.6°F | Resp 18 | Ht 66.0 in | Wt 199.0 lb

## 2020-07-26 DIAGNOSIS — C9000 Multiple myeloma not having achieved remission: Secondary | ICD-10-CM

## 2020-07-26 DIAGNOSIS — Z950 Presence of cardiac pacemaker: Secondary | ICD-10-CM | POA: Diagnosis not present

## 2020-07-26 LAB — CBC WITH DIFFERENTIAL (CANCER CENTER ONLY)
Abs Immature Granulocytes: 0.01 10*3/uL (ref 0.00–0.07)
Basophils Absolute: 0 10*3/uL (ref 0.0–0.1)
Basophils Relative: 2 %
Eosinophils Absolute: 0.1 10*3/uL (ref 0.0–0.5)
Eosinophils Relative: 2 %
HCT: 36.1 % (ref 36.0–46.0)
Hemoglobin: 11.8 g/dL — ABNORMAL LOW (ref 12.0–15.0)
Immature Granulocytes: 0 %
Lymphocytes Relative: 43 %
Lymphs Abs: 1.1 10*3/uL (ref 0.7–4.0)
MCH: 29.5 pg (ref 26.0–34.0)
MCHC: 32.7 g/dL (ref 30.0–36.0)
MCV: 90.3 fL (ref 80.0–100.0)
Monocytes Absolute: 0.2 10*3/uL (ref 0.1–1.0)
Monocytes Relative: 9 %
Neutro Abs: 1.1 10*3/uL — ABNORMAL LOW (ref 1.7–7.7)
Neutrophils Relative %: 44 %
Platelet Count: 98 10*3/uL — ABNORMAL LOW (ref 150–400)
RBC: 4 MIL/uL (ref 3.87–5.11)
RDW: 14.3 % (ref 11.5–15.5)
WBC Count: 2.6 10*3/uL — ABNORMAL LOW (ref 4.0–10.5)
nRBC: 0 % (ref 0.0–0.2)

## 2020-07-26 LAB — CMP (CANCER CENTER ONLY)
ALT: 26 U/L (ref 0–44)
AST: 28 U/L (ref 15–41)
Albumin: 3.7 g/dL (ref 3.5–5.0)
Alkaline Phosphatase: 47 U/L (ref 38–126)
Anion gap: 4 — ABNORMAL LOW (ref 5–15)
BUN: 14 mg/dL (ref 8–23)
CO2: 27 mmol/L (ref 22–32)
Calcium: 8.6 mg/dL — ABNORMAL LOW (ref 8.9–10.3)
Chloride: 109 mmol/L (ref 98–111)
Creatinine: 0.65 mg/dL (ref 0.44–1.00)
GFR, Est AFR Am: >60 mL/min (ref >60)
GFR, Estimated: >60 mL/min (ref >60)
Glucose, Bld: 128 mg/dL — ABNORMAL HIGH (ref 70–99)
Potassium: 3.8 mmol/L (ref 3.5–5.1)
Sodium: 140 mmol/L (ref 135–145)
Total Bilirubin: 0.4 mg/dL (ref 0.3–1.2)
Total Protein: 7.8 g/dL (ref 6.5–8.1)

## 2020-07-26 NOTE — Progress Notes (Signed)
Hematology and Oncology Follow Up Visit  Tami [name] [id number] 08/20/1956 64 y.o. 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  We last saw her 3 months ago.  Surprisingly enough, she has had problems with her pacemaker/defibrillator.  She apparently was at home.  This was at the end of July.  She was just finishing up washing dishes and then the pacemaker/defibrillator went off.  She had no problems at all.  She was able to get EMS.  She is in the hospital for a week.  A cardiac cath was done.  It was ultimately found that she had a very low magnesium level.  She has recovered quite nicely.  When we last saw her in June, her M spike was up to 1.2 g/dL.  Her IgG level was 1837 mg/dL.  We will have to watch this very carefully.    As always, she been traveling.  She has been to [place].  She was up to [place], [place].  I think should be going down to [facility] in October.  She has had no bony pain.  She has had no fever.  She has had no cough or shortness of breath.  There has been no leg swelling.  Overall, her performance status is ECOG 1.     Medications:  Allergies as of 07/26/2020      Reactions   Benazepril Anaphylaxis, Swelling, Hives   angioedema Throat and lip swelling   Ondansetron Hcl Hives   Redness and hives post IV admin on 07/05/17   Codeine Nausea And Vomiting   Morphine Hives   Redness and hives noted post IV admin on 07/05/17      Medication List       Accurate as of July 26, 2020  2:51 PM. If you have any questions, ask your nurse or doctor.        STOP taking these medications   azithromycin 250 MG tablet Commonly  known as: ZITHROMAX Stopped by: [name], MD     TAKE these medications   albuterol 108 (90 Base) MCG/ACT inhaler Commonly known as: VENTOLIN HFA Inhale 2 puffs into the lungs every 6 (six) hours as needed for wheezing or shortness of breath.   aspirin EC 81 MG tablet Take 1 tablet (81 mg total) by mouth daily.   butalbital-acetaminophen-caffeine 50-325-40-30 MG capsule Commonly known as: FIORICET WITH CODEINE Take 1 capsule by mouth every 4 (four) hours as needed for headache.   carvedilol 25 MG tablet Commonly known as: COREG Take 1 tablet (25 mg total) by mouth 2 (two) times daily with a meal.   cyclobenzaprine 10 MG tablet Commonly known as: FLEXERIL Take 1 tablet (10 mg total) by mouth at bedtime.   fluticasone 50 MCG/ACT nasal spray Commonly known as: FLONASE Place 2 sprays into both nostrils daily.   furosemide 20 MG tablet Commonly known as: LASIX Take 1 tablet by mouth as directed.   hydrOXYzine 10 MG tablet Commonly known as: ATARAX/VISTARIL Take 1 tablet (10 mg total) by mouth 3 (three) times daily as needed for itching.   losartan 25 MG tablet Commonly known as: COZAAR Take by mouth.  magnesium oxide 400 MG tablet Commonly known as: MAG-OX Take 1 tablet (400 mg total) by mouth 2 (two) times daily.   meclizine 12.5 MG tablet Commonly known as: ANTIVERT Take 12.5 mg by mouth 3 (three) times daily.   melatonin 3 MG Tabs tablet Take 3 mg by mouth at bedtime.   metoCLOPramide 10 MG tablet Commonly known as: Reglan Take 1 tablet (10 mg total) by mouth 4 (four) times daily. For migraine or headache or nausea   montelukast 10 MG tablet Commonly known as: Singulair Take 1 tablet (10 mg total) by mouth at bedtime. Start taking Singulair on 11/16/2019.   MULTIVITAMIN ADULT PO Take 1 tablet by mouth daily.   Naproxen 375 MG Tbec Take 1 tablet (375 mg total) by mouth every morning.   ondansetron 4 MG tablet Commonly known as: Zofran Take 1  tablet (4 mg total) by mouth every 8 (eight) hours as needed for nausea or vomiting.   pantoprazole 40 MG tablet Commonly known as: PROTONIX Take 1 tablet (40 mg total) by mouth 2 (two) times daily.   potassium chloride SA 20 MEQ tablet Commonly known as: KLOR-CON Take 1 tablet (20 mEq total) by mouth daily.   Premarin vaginal cream Generic drug: conjugated estrogens Place 1 Applicatorful vaginally as needed.   prochlorperazine 10 MG tablet Commonly known as: COMPAZINE Take 1 tablet (10 mg total) by mouth every 6 (six) hours as needed (Nausea or vomiting).   promethazine 25 MG tablet Commonly known as: PHENERGAN Take 1 tablet (25 mg total) by mouth every 8 (eight) hours as needed for nausea or vomiting.   spironolactone 25 MG tablet Commonly known as: ALDACTONE Take 1 tablet by mouth daily.   topiramate 50 MG tablet Commonly known as: Topamax Take 1 tablet (50 mg total) by mouth 2 (two) times daily.   Vitamin D-3 25 MCG (1000 UT) Caps Take 1 capsule (1,000 Units total) by mouth daily.       Allergies:  Allergies  Allergen Reactions  . Benazepril Anaphylaxis, Swelling and Hives    angioedema Throat and lip swelling  . Ondansetron Hcl Hives    Redness and hives post IV admin on 07/05/17  . Codeine Nausea And Vomiting  . Morphine Hives    Redness and hives noted post IV admin on 07/05/17    Past Medical History, Surgical history, Social history, and Family History were reviewed and updated.  Review of Systems: Review of Systems  Constitutional: Negative.   HENT: Negative.   Eyes: Negative.   Respiratory: Negative.   Cardiovascular: Negative.   Gastrointestinal: Negative.   Genitourinary: Negative.   Musculoskeletal: Negative.   Skin: Negative.   Neurological: Negative.   Endo/Heme/Allergies: Negative.   Psychiatric/Behavioral: Negative.       Physical Exam:  height is 5\' 6"  (1.676 m) and weight is 199 lb (90.3 kg). Her oral temperature is 97.6 F  (36.4 C). Her blood pressure is 137/83 and her pulse is 92. Her respiration is 18 and oxygen saturation is 100%.   Wt Readings from Last 3 Encounters:  07/26/20 199 lb (90.3 kg)  06/24/20 201 lb 12.8 oz (91.5 kg)  06/04/20 197 lb 3.2 oz (89.4 kg)    Physical Exam Vitals reviewed.  HENT:     Head: Normocephalic and atraumatic.  Eyes:     Pupils: Pupils are equal, round, and reactive to light.  Cardiovascular:     Rate and Rhythm: Normal rate and regular rhythm.     Heart sounds:  Normal heart sounds.  Pulmonary:     Effort: Pulmonary effort is normal.     Breath sounds: Normal breath sounds.  Abdominal:     General: Bowel sounds are normal.     Palpations: Abdomen is soft.  Musculoskeletal:        General: No tenderness or deformity. Normal range of motion.     Cervical back: Normal range of motion.  Lymphadenopathy:     Cervical: No cervical adenopathy.  Skin:    General: Skin is warm and dry.     Findings: No erythema or rash.  Neurological:     Mental Status: She is alert and oriented to person, place, and time.  Psychiatric:        Behavior: Behavior normal.        Thought Content: Thought content normal.        Judgment: Judgment normal.      Lab Results  Component Value Date   WBC 2.6 (L) 07/26/2020   HGB 11.8 (L) 07/26/2020   HCT 36.1 07/26/2020   MCV 90.3 07/26/2020   PLT 98 (L) 07/26/2020   Lab Results  Component Value Date   FERRITIN 81 04/22/2020   IRON 67 04/22/2020   TIBC 369 04/22/2020   UIBC 302 04/22/2020   IRONPCTSAT 18 (L) 04/22/2020   Lab Results  Component Value Date   RETICCTPCT 0.8 07/16/2016   RBC 4.00 07/26/2020   RETICCTABS 32,640 07/16/2016   Lab Results  Component Value Date   KPAFRELGTCHN 39.5 (H) 04/25/2020   LAMBDASER 6.6 04/25/2020   KAPLAMBRATIO 5.98 (H) 04/25/2020   Lab Results  Component Value Date   IGGSERUM 1,837 (H) 04/25/2020   IGGSERUM 1,843 (H) 04/25/2020   IGA 75 (L) 04/25/2020   IGA 76 (L) 04/25/2020    IGMSERUM 64 04/25/2020   IGMSERUM 58 04/25/2020   Lab Results  Component Value Date   TOTALPROTELP 7.4 04/25/2020   ALBUMINELP 3.2 04/25/2020   A1GS 0.3 04/25/2020   A2GS 1.1 (H) 04/25/2020   BETS 1.1 04/25/2020   BETA2SER 0.5 07/16/2016   GAMS 1.7 04/25/2020   MSPIKE 1.2 (H) 04/25/2020   SPEI Comment 12/07/2019     Chemistry      Component Value Date/Time   NA 140 07/26/2020 1352   NA 143 11/08/2017 1127   NA 140 12/31/2016 1000   K 3.8 07/26/2020 1352   K 4.0 11/08/2017 1127   K 3.6 12/31/2016 1000   CL 109 07/26/2020 1352   CL 107 11/08/2017 1127   CO2 27 07/26/2020 1352   CO2 25 11/08/2017 1127   CO2 24 12/31/2016 1000   BUN 14 07/26/2020 1352   BUN 19 11/08/2017 1127   BUN 22.3 12/31/2016 1000   CREATININE 0.65 07/26/2020 1352   CREATININE 0.9 11/08/2017 1127   CREATININE 0.8 12/31/2016 1000      Component Value Date/Time   CALCIUM 8.6 (L) 07/26/2020 1352   CALCIUM 9.9 11/08/2017 1127   CALCIUM 9.5 12/31/2016 1000   ALKPHOS 47 07/26/2020 1352   ALKPHOS 44 11/08/2017 1127   ALKPHOS 51 12/31/2016 1000   AST 28 07/26/2020 1352   AST 28 12/31/2016 1000   ALT 26 07/26/2020 1352   ALT 36 11/08/2017 1127   ALT 16 12/31/2016 1000   BILITOT 0.4 07/26/2020 1352   BILITOT 0.46 12/31/2016 1000       Impression and Plan: Ms. Sundberg is a very pleasant 64 yo African American female with a previous IgG kappa MGUS with  progression to myeloma.  I am very surprised about the cardiac issues.  Hopefully, this is a solitary occurrence.  We will still try to get her back in 3 months.  A lot will depend upon the monoclonal studies.  At least, her quality life continues to be quite good.    [name], MD 9/17/20212:51 PM

## 2020-07-27 LAB — IGG, IGA, IGM
IgA: 119 mg/dL (ref 87–352)
IgG (Immunoglobin G), Serum: 2186 mg/dL — ABNORMAL HIGH (ref 586–1602)
IgM (Immunoglobulin M), Srm: 82 mg/dL (ref 26–217)

## 2020-07-29 DIAGNOSIS — I5022 Chronic systolic (congestive) heart failure: Secondary | ICD-10-CM | POA: Diagnosis not present

## 2020-07-29 LAB — KAPPA/LAMBDA LIGHT CHAINS
Kappa free light chain: 62 mg/L — ABNORMAL HIGH (ref 3.3–19.4)
Kappa, lambda light chain ratio: 15.9 — ABNORMAL HIGH (ref 0.26–1.65)
Lambda free light chains: 3.9 mg/L — ABNORMAL LOW (ref 5.7–26.3)

## 2020-07-30 ENCOUNTER — Other Ambulatory Visit: Payer: Self-pay

## 2020-07-30 ENCOUNTER — Ambulatory Visit (INDEPENDENT_AMBULATORY_CARE_PROVIDER_SITE_OTHER): Payer: Medicare Other | Admitting: Family Medicine

## 2020-07-30 VITALS — BP 141/78 | HR 88 | Temp 98.0°F | Resp 13 | Ht 66.0 in | Wt 198.6 lb

## 2020-07-30 DIAGNOSIS — K2100 Gastro-esophageal reflux disease with esophagitis, without bleeding: Secondary | ICD-10-CM | POA: Diagnosis not present

## 2020-07-30 DIAGNOSIS — Z23 Encounter for immunization: Secondary | ICD-10-CM

## 2020-07-30 DIAGNOSIS — R790 Abnormal level of blood mineral: Secondary | ICD-10-CM

## 2020-07-30 DIAGNOSIS — R7989 Other specified abnormal findings of blood chemistry: Secondary | ICD-10-CM

## 2020-07-30 DIAGNOSIS — I10 Essential (primary) hypertension: Secondary | ICD-10-CM | POA: Diagnosis not present

## 2020-07-30 DIAGNOSIS — R739 Hyperglycemia, unspecified: Secondary | ICD-10-CM | POA: Diagnosis not present

## 2020-07-30 DIAGNOSIS — E785 Hyperlipidemia, unspecified: Secondary | ICD-10-CM | POA: Diagnosis not present

## 2020-07-30 LAB — PROTEIN ELECTROPHORESIS, SERUM, WITH REFLEX
A/G Ratio: 0.9 (ref 0.7–1.7)
Albumin ELP: 3.5 g/dL (ref 2.9–4.4)
Alpha-1-Globulin: 0.2 g/dL (ref 0.0–0.4)
Alpha-2-Globulin: 0.9 g/dL (ref 0.4–1.0)
Beta Globulin: 1 g/dL (ref 0.7–1.3)
Gamma Globulin: 2 g/dL — ABNORMAL HIGH (ref 0.4–1.8)
Globulin, Total: 4.1 g/dL — ABNORMAL HIGH (ref 2.2–3.9)
M-Spike, %: 1.4 g/dL — ABNORMAL HIGH
SPEP Interpretation: 0
Total Protein ELP: 7.6 g/dL (ref 6.0–8.5)

## 2020-07-30 LAB — IMMUNOFIXATION REFLEX, SERUM
IgA: 109 mg/dL (ref 87–352)
IgG (Immunoglobin G), Serum: 2115 mg/dL — ABNORMAL HIGH (ref 586–1602)
IgM (Immunoglobulin M), Srm: 80 mg/dL (ref 26–217)

## 2020-07-30 MED ORDER — SUCRALFATE 1 G PO TABS
1.0000 g | ORAL_TABLET | Freq: Three times a day (TID) | ORAL | 1 refills | Status: DC | PRN
Start: 2020-07-30 — End: 2022-04-30

## 2020-07-30 NOTE — Patient Instructions (Addendum)
Shingrix shots 2 shots over 2-6 months at pharmacy must take at least 2 weeks after any other shots.   Food Choices to Help Relieve Diarrhea, Adult When you have diarrhea, the foods you eat and your eating habits are very important. Choosing the right foods and drinks can help:  Relieve diarrhea.  Replace lost fluids and nutrients.  Prevent dehydration. What general guidelines should I follow?  Relieving diarrhea  Choose foods with less than 2 g or .07 oz. of fiber per serving.  Limit fats to less than 8 tsp (38 g or 1.34 oz.) a day.  Avoid the following: ? Foods and beverages sweetened with high-fructose corn syrup, honey, or sugar alcohols such as xylitol, sorbitol, and mannitol. ? Foods that contain a lot of fat or sugar. ? Fried, greasy, or spicy foods. ? High-fiber grains, breads, and cereals. ? Raw fruits and vegetables.  Eat foods that are rich in probiotics. These foods include dairy products such as yogurt and fermented milk products. They help increase healthy bacteria in the stomach and intestines (gastrointestinal tract, or GI tract).  If you have lactose intolerance, avoid dairy products. These may make your diarrhea worse.  Take medicine to help stop diarrhea (antidiarrheal medicine) only as told by your health care provider. Replacing nutrients  Eat small meals or snacks every 3-4 hours.  Eat bland foods, such as white rice, toast, or baked potato, until your diarrhea starts to get better. Gradually reintroduce nutrient-rich foods as tolerated or as told by your health care provider. This includes: ? Well-cooked protein foods. ? Peeled, seeded, and soft-cooked fruits and vegetables. ? Low-fat dairy products.  Take vitamin and mineral supplements as told by your health care provider. Preventing dehydration  Start by sipping water or a special solution to prevent dehydration (oral rehydration solution, ORS). Urine that is clear or pale yellow means that you are  getting enough fluid.  Try to drink at least 8-10 cups of fluid each day to help replace lost fluids.  You may add other liquids in addition to water, such as clear juice or decaffeinated sports drinks, as tolerated or as told by your health care provider.  Avoid drinks with caffeine, such as coffee, tea, or soft drinks.  Avoid alcohol. What foods are recommended?     The items listed may not be a complete list. Talk with your health care provider about what dietary choices are best for you. Grains White rice. White, [place], or pita breads (fresh or toasted), including plain rolls, buns, or bagels. White pasta. Saltine, soda, or graham crackers. Pretzels. Low-fiber cereal. Cooked cereals made with water (such as cornmeal, farina, or cream cereals). Plain muffins. Matzo. Melba toast. Zwieback. Vegetables Potatoes (without the skin). Most well-cooked and canned vegetables without skins or seeds. Tender lettuce. Fruits Apple sauce. Fruits canned in juice. Cooked apricots, cherries, grapefruit, peaches, pears, or plums. Fresh bananas and cantaloupe. Meats and other protein foods Baked or boiled chicken. Eggs. Tofu. Fish. Seafood. Smooth nut butters. Ground or well-cooked tender beef, ham, veal, lamb, pork, or poultry. Dairy Plain yogurt, kefir, and unsweetened liquid yogurt. Lactose-free milk, buttermilk, skim milk, or soy milk. Low-fat or nonfat hard cheese. Beverages Water. Low-calorie sports drinks. Fruit juices without pulp. Strained tomato and vegetable juices. Decaffeinated teas. Sugar-free beverages not sweetened with sugar alcohols. Oral rehydration solutions, if approved by your health care provider. Seasoning and other foods Bouillon, broth, or soups made from recommended foods. What foods are not recommended? The items listed may  not be a complete list. Talk with your health care provider about what dietary choices are best for you. Grains Whole grain, whole wheat, bran, or rye  breads, rolls, pastas, and crackers. Wild or brown rice. Whole grain or bran cereals. Barley. Oats and oatmeal. Corn tortillas or taco shells. Granola. Popcorn. Vegetables Raw vegetables. Fried vegetables. Cabbage, broccoli, Brussels sprouts, artichokes, baked beans, beet greens, corn, kale, legumes, peas, sweet potatoes, and yams. Potato skins. Cooked spinach and cabbage. Fruits Dried fruit, including raisins and dates. Raw fruits. Stewed or dried prunes. Canned fruits with syrup. Meat and other protein foods Fried or fatty meats. Deli meats. Chunky nut butters. Nuts and seeds. Beans and lentils. Berniece Salines. Hot dogs. Sausage. Dairy High-fat cheeses. Whole milk, chocolate milk, and beverages made with milk, such as milk shakes. Half-and-half. Cream. sour cream. Ice cream. Beverages Caffeinated beverages (such as coffee, tea, soda, or energy drinks). Alcoholic beverages. Fruit juices with pulp. Prune juice. Soft drinks sweetened with high-fructose corn syrup or sugar alcohols. High-calorie sports drinks. Fats and oils Butter. Cream sauces. Margarine. Salad oils. Plain salad dressings. Olives. Avocados. Mayonnaise. Sweets and desserts Sweet rolls, doughnuts, and sweet breads. Sugar-free desserts sweetened with sugar alcohols such as xylitol and sorbitol. Seasoning and other foods Honey. Hot sauce. Chili powder. Gravy. Cream-based or milk-based soups. Pancakes and waffles. Summary  When you have diarrhea, the foods you eat and your eating habits are very important.  Make sure you get at least 8-10 cups of fluid each day, or enough to keep your urine clear or pale yellow.  Eat bland foods and gradually reintroduce healthy, nutrient-rich foods as tolerated, or as told by your health care provider.  Avoid high-fiber, fried, greasy, or spicy foods. This information is not intended to replace advice given to you by your health care provider. Make sure you discuss any questions you have with your health  care provider. Document Revised: 02/16/2019 Document Reviewed: 10/23/2016 Elsevier Patient Education  [place].

## 2020-07-31 ENCOUNTER — Telehealth: Payer: Self-pay | Admitting: Family Medicine

## 2020-07-31 DIAGNOSIS — R7989 Other specified abnormal findings of blood chemistry: Secondary | ICD-10-CM | POA: Insufficient documentation

## 2020-07-31 DIAGNOSIS — I5022 Chronic systolic (congestive) heart failure: Secondary | ICD-10-CM | POA: Diagnosis not present

## 2020-07-31 DIAGNOSIS — E569 Vitamin deficiency, unspecified: Secondary | ICD-10-CM | POA: Insufficient documentation

## 2020-07-31 LAB — HEMOGLOBIN A1C
Hgb A1c MFr Bld: 5.7 % of total Hgb — ABNORMAL HIGH (ref <5.7)
Mean Plasma Glucose: 117 (calc)
eAG (mmol/L): 6.5 (calc)

## 2020-07-31 LAB — LIPID PANEL
Cholesterol: 176 mg/dL (ref <200)
HDL: 40 mg/dL — ABNORMAL LOW (ref >=50)
LDL Cholesterol (Calc): 104 mg/dL (calc) — ABNORMAL HIGH
Non-HDL Cholesterol (Calc): 136 mg/dL (calc) — ABNORMAL HIGH (ref <130)
Total CHOL/HDL Ratio: 4.4 (calc) (ref <5.0)
Triglycerides: 200 mg/dL — ABNORMAL HIGH (ref <150)

## 2020-07-31 LAB — VITAMIN D 25 HYDROXY (VIT D DEFICIENCY, FRACTURES): Vit D, 25-Hydroxy: 30 ng/mL (ref 30–100)

## 2020-07-31 LAB — TSH: TSH: 1.76 mIU/L (ref 0.40–4.50)

## 2020-07-31 LAB — MAGNESIUM: Magnesium: 1.8 mg/dL (ref 1.5–2.5)

## 2020-07-31 LAB — SPECIMEN COMPROMISED

## 2020-07-31 NOTE — Progress Notes (Signed)
Patient notified

## 2020-07-31 NOTE — Assessment & Plan Note (Signed)
hgba1c acceptable, minimize simple carbs. Increase exercise as tolerated.  

## 2020-07-31 NOTE — Assessment & Plan Note (Signed)
Has noted an increase in reflux and epigastric pain again. Restart Sucralfate which has helped her previously. Avoid offending foods, start probiotics. Do not eat large meals in late evening and consider raising head of bed. Report if symptoms worsen

## 2020-07-31 NOTE — Assessment & Plan Note (Signed)
Well controlled, no changes to meds. Encouraged heart healthy diet such as the DASH diet and exercise as tolerated.  °

## 2020-07-31 NOTE — Assessment & Plan Note (Signed)
Supplement and monitor 

## 2020-07-31 NOTE — Telephone Encounter (Signed)
Patient is returning your call  In regards to lab

## 2020-07-31 NOTE — Progress Notes (Signed)
[name], female   DOB: 11/22/1955, 64 y.o.   MRN: [id number]   Subjective:    [name], female    DOB: 05-Oct-1956, 64 y.o.   MRN: [id number]  Chief Complaint  Patient presents with  . 6 weeks followup    HPI Patient is in today for follow up on chronic medical concerns. No recent febrile illness. She has noted an increase in epigastric pain and reflux recently. No change in diet but she has noted an increase in loose bowel movements. She notes fatigue persists. Denies CP/palp/SOB/HA/congestion/fevers/GI or GU c/o. Taking meds as prescribed  Past Medical History:  Diagnosis Date  . Abnormal SPEP 07/26/2016  . Arthritis    knees, hands  . Back pain 07/14/2016  . Bowel obstruction ([place]) 11/2014  . C. difficile diarrhea   . CHF (congestive heart failure) ([place])   . Colitis   . Congestive heart failure ([place]) 02/24/2015   S/p pacemaker  . Depression   . Elevated sed rate 08/15/2017  . Epigastric pain 03/22/2017  . GERD (gastroesophageal reflux disease)   . Heart disease   . Hyperlipidemia   . Hypertension   . 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 Low back pain 07/14/2016  . 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 Multiple myeloma ([place]) 07/20/2018  . Multiple myeloma not having achieved remission ([place]) 07/20/2018  . Myalgia 12/31/2016  . Obstructive sleep apnea 03/31/2015  . SOB (shortness of breath) 04/09/2016  . Stroke ([place])    TIAs  . UTI (urinary tract infection)     Past Surgical History:  Procedure Laterality Date  . ABDOMINAL HYSTERECTOMY     menorraghia, 2006, total  . CHOLECYSTECTOMY    . COLONOSCOPY  2018   [facility] per patient  . ESOPHAGOGASTRODUODENOSCOPY  2018   [facility]   . KNEE SURGERY     right, repair torn torn cartialage  . PACEMAKER INSERTION  08/2014  . TONSILLECTOMY    . TUBAL LIGATION      Family History  Problem Relation Age of Onset  . Diabetes Mother   .  Hypertension Mother   . Heart disease Mother        s/p 1 stent  . Hyperlipidemia Mother   . Arthritis Mother   . Cancer Father        COLON  . Colon cancer Father 41  . Irritable bowel syndrome Sister   . Hyperlipidemia Daughter   . Hypertension Daughter   . Hypertension Maternal Grandmother   . Arthritis Maternal Grandmother   . Heart disease Maternal Grandfather        MI  . Hypertension Maternal Grandfather   . Arthritis Maternal Grandfather   . Hypertension Son   . Esophageal cancer Neg Hx     Social History   Socioeconomic History  . Marital status: Divorced    Spouse name: [name]  . Number of children: 3  . Years of education: [name]  . Highest education level: High school graduate  Occupational History  . [name]  Tobacco Use  . Smoking status: Never Smoker  . Smokeless tobacco: Never Used  Vaping Use  . Vaping Use: Never used  Substance and Sexual Activity  . Alcohol use: No  . Drug use: No  . Sexual activity: [name]  Other Topics Concern  . [name]  Social History Narrative   Lives at home alone   Retired  Caffeine: coffee   Social Determinants of Health   Financial Resource Strain:   . Difficulty of Paying Living Expenses: [name]  Food Insecurity:   . Worried About Charity fundraiser in the Last Year: [name]  . Ran Out of Food in the Last Year: [name]  Transportation Needs:   . Lack of Transportation (Medical): [name]  . Lack of Transportation (Non-Medical): [name]  Physical Activity:   . Days of Exercise per Week: [name]  . Minutes of Exercise per Session: [name]  Stress:   . Feeling of Stress : [name]  Social Connections:   . Frequency of Communication with Friends and Family: [name]  . Frequency of Social Gatherings with Friends and Family: [name]  . Attends Religious Services: [name]  . Active Member of Clubs or Organizations: [name]  . Attends Theatre manager Meetings: [name]  . Marital Status: [name]  Intimate Partner Violence:   . Fear of Current or Ex-Partner: [name]  . Emotionally Abused: [name]  . Physically Abused: [name]  . Sexually Abused: [name]    Outpatient Medications Prior to Visit  Medication Sig Dispense Refill  . albuterol (PROVENTIL HFA;VENTOLIN HFA) 108 (90 Base) MCG/ACT inhaler Inhale 2 puffs into the lungs every 6 (six) hours as needed for wheezing or shortness of breath. 1 Inhaler 2  . aspirin EC 81 MG tablet Take 1 tablet (81 mg total) by mouth daily. 90 tablet 1  . carvedilol (COREG) 25 MG tablet Take 1 tablet (25 mg total) by mouth 2 (two) times daily with a meal. 180 tablet 1  . Cholecalciferol (VITAMIN D-3) 25 MCG (1000 UT) CAPS Take 1 capsule (1,000 Units total) by mouth daily. 60 capsule 2  . conjugated estrogens (PREMARIN) vaginal cream Place 1 Applicatorful vaginally as needed.     . cyclobenzaprine (FLEXERIL) 10 MG tablet Take 1 tablet (10 mg total) by mouth at bedtime. 7 tablet 0  . fluticasone (FLONASE) 50 MCG/ACT nasal spray Place 2 sprays into both nostrils daily. 16 g 0  . furosemide (LASIX) 20 MG tablet Take 1 tablet by mouth as directed.    Marland Kitchen losartan (COZAAR) 25 MG tablet Take by mouth.    . magnesium oxide (MAG-OX) 400 MG tablet Take 1 tablet (400 mg total) by mouth 2 (two) times daily. 60 tablet 1  . meclizine (ANTIVERT) 12.5 MG tablet Take 12.5 mg by mouth 3 (three) times daily.    . melatonin 3 MG TABS tablet Take 3 mg by mouth at bedtime.    . montelukast (SINGULAIR) 10 MG tablet Take 1 tablet (10 mg total) by mouth at bedtime. Start taking Singulair on 11/16/2019. 20 tablet 0  . Multiple Vitamins-Minerals (MULTIVITAMIN ADULT PO) Take 1 tablet by mouth daily.     . Naproxen 375 MG TBEC Take 1 tablet (375 mg total) by mouth every morning. 30 tablet 2  . ondansetron (ZOFRAN) 4 MG tablet Take 1 tablet (4 mg total) by mouth every 8 (eight) hours as needed for  nausea or vomiting. 30 tablet 1  . pantoprazole (PROTONIX) 40 MG tablet Take 1 tablet (40 mg total) by mouth 2 (two) times daily. 180 tablet 1  . potassium chloride SA (KLOR-CON) 20 MEQ tablet Take 1 tablet (20 mEq total) by mouth daily. 30 tablet 3  .  spironolactone (ALDACTONE) 25 MG tablet Take 1 tablet by mouth daily.    Marland Kitchen topiramate (TOPAMAX) 50 MG tablet Take 1 tablet (50 mg total) by mouth 2 (two) times daily. 60 tablet 3  . butalbital-acetaminophen-caffeine (FIORICET WITH CODEINE) 50-325-40-30 MG capsule Take 1 capsule by mouth every 4 (four) hours as needed for headache. 30 capsule 0  . hydrOXYzine (ATARAX/VISTARIL) 10 MG tablet Take 1 tablet (10 mg total) by mouth 3 (three) times daily as needed for itching. 30 tablet 0  . metoCLOPramide (REGLAN) 10 MG tablet Take 1 tablet (10 mg total) by mouth 4 (four) times daily. For migraine or headache or nausea 30 tablet 6  . prochlorperazine (COMPAZINE) 10 MG tablet Take 1 tablet (10 mg total) by mouth every 6 (six) hours as needed (Nausea or vomiting). 30 tablet 1  . promethazine (PHENERGAN) 25 MG tablet Take 1 tablet (25 mg total) by mouth every 8 (eight) hours as needed for nausea or vomiting. 30 tablet 2   No facility-administered medications prior to visit.    Allergies  Allergen Reactions  . Benazepril Anaphylaxis, Swelling and Hives    angioedema Throat and lip swelling  . Ondansetron Hcl Hives    Redness and hives post IV admin on 07/05/17  . Codeine Nausea And Vomiting  . Morphine Hives    Redness and hives noted post IV admin on 07/05/17    Review of Systems  Constitutional: Positive for malaise/fatigue. Negative for fever.  HENT: Negative for congestion.   Eyes: Negative for blurred vision.  Respiratory: Negative for shortness of breath.   Cardiovascular: Negative for chest pain, palpitations and leg swelling.  Gastrointestinal: Positive for abdominal pain, diarrhea and heartburn. Negative for blood in stool, constipation  and nausea.  Genitourinary: Negative for dysuria and frequency.  Musculoskeletal: Positive for back pain. Negative for falls.  Skin: Negative for rash.  Neurological: Negative for dizziness, loss of consciousness and headaches.  Endo/Heme/Allergies: Negative for environmental allergies.  Psychiatric/Behavioral: Negative for depression. The patient is not nervous/anxious.        Objective:    Physical Exam  BP (!) 141/78 (BP Location: Right Arm, Patient Position: Sitting, Cuff Size: Small)   Pulse 88   Temp 98 F (36.7 C) (Oral)   Resp 13   Ht _0  (1.676 m)   Wt 198 lb 9.6 oz (90.1 kg)   BMI 32.05 kg/m  Wt Readings from Last 3 Encounters:  07/30/20 198 lb 9.6 oz (90.1 kg)  07/26/20 199 lb (90.3 kg)  06/24/20 201 lb 12.8 oz (91.5 kg)    Diabetic Foot Exam - Simple   No data filed     Lab Results  Component Value Date   WBC 2.6 (L) 07/26/2020   HGB 11.8 (L) 07/26/2020   HCT 36.1 07/26/2020   PLT 98 (L) 07/26/2020   GLUCOSE 128 (H) 07/26/2020   CHOL 176 07/30/2020   TRIG 200 (H) 07/30/2020   HDL 40 (L) 07/30/2020   LDLCALC 104 (H) 07/30/2020   ALT 26 07/26/2020   AST 28 07/26/2020   NA 140 07/26/2020   K 3.8 07/26/2020   CL 109 07/26/2020   CREATININE 0.65 07/26/2020   BUN 14 07/26/2020   CO2 27 07/26/2020   TSH 1.76 07/30/2020   INR 0.90 07/28/2018   HGBA1C 5.7 (H) 07/30/2020    Lab Results  Component Value Date   TSH 1.76 07/30/2020   Lab Results  Component Value Date   WBC 2.6 (L) 07/26/2020  HGB 11.8 (L) 07/26/2020   HCT 36.1 07/26/2020   MCV 90.3 07/26/2020   PLT 98 (L) 07/26/2020   Lab Results  Component Value Date   NA 140 07/26/2020   K 3.8 07/26/2020   CHLORIDE 108 12/31/2016   CO2 27 07/26/2020   GLUCOSE 128 (H) 07/26/2020   BUN 14 07/26/2020   CREATININE 0.65 07/26/2020   BILITOT 0.4 07/26/2020   ALKPHOS 47 07/26/2020   AST 28 07/26/2020   ALT 26 07/26/2020   PROT 7.8 07/26/2020   ALBUMIN 3.7 07/26/2020   CALCIUM 8.6 (L)  07/26/2020   ANIONGAP 4 (L) 07/26/2020   EGFR >90 12/31/2016   GFR 106.55 06/04/2020   Lab Results  Component Value Date   CHOL 176 07/30/2020   Lab Results  Component Value Date   HDL 40 (L) 07/30/2020   Lab Results  Component Value Date   LDLCALC 104 (H) 07/30/2020   Lab Results  Component Value Date   TRIG 200 (H) 07/30/2020   Lab Results  Component Value Date   CHOLHDL 4.4 07/30/2020   Lab Results  Component Value Date   HGBA1C 5.7 (H) 07/30/2020       Assessment & Plan:   Problem List Items Addressed This Visit    GERD (gastroesophageal reflux disease)    Has noted an increase in reflux and epigastric pain again. Restart Sucralfate which has helped her previously. Avoid offending foods, start probiotics. Do not eat large meals in late evening and consider raising head of bed. Report if symptoms worsen      Relevant Medications   sucralfate (CARAFATE) 1 g tablet   Hyperlipidemia    Encouraged heart healthy diet, increase exercise, avoid trans fats, consider a krill oil cap daily      Relevant Orders   TSH (Completed)   Magnesium (Completed)   Hypertension    Well controlled, no changes to meds. Encouraged heart healthy diet such as the DASH diet and exercise as tolerated.       Relevant Orders   Lipid panel (Completed)   Hyperglycemia    hgba1c acceptable, minimize simple carbs. Increase exercise as tolerated.       Relevant Orders   Hemoglobin A1c (Completed)   Low magnesium level    Supplement and monitor      Low vitamin D level    Supplement and monitor       Relevant Orders   VITAMIN D 25 Hydroxy (Vit-D Deficiency, Fractures) (Completed)    Other Visit Diagnoses    Influenza vaccine needed    -  Primary   Relevant Orders   Flu Vaccine QUAD High Dose(Fluad) (Completed)      I have discontinued Lannette Donath metoCLOPramide, promethazine, prochlorperazine, hydrOXYzine, and butalbital-acetaminophen-caffeine. I have also changed her  sucralfate. Additionally, I am having her maintain her conjugated estrogens, pantoprazole, aspirin EC, ondansetron, albuterol, Multiple Vitamins-Minerals (MULTIVITAMIN ADULT PO), topiramate, montelukast, fluticasone, cyclobenzaprine, meclizine, Naproxen, magnesium oxide, potassium chloride SA, Vitamin D-3, losartan, spironolactone, melatonin, carvedilol, and furosemide.  Meds ordered this encounter  Medications  . sucralfate (CARAFATE) 1 g tablet    Sig: Take 1 tablet (1 g total) by mouth 3 (three) times daily as needed. prn    Dispense:  90 tablet    Refill:  1     Penni Homans, MD

## 2020-07-31 NOTE — Assessment & Plan Note (Signed)
Encouraged heart healthy diet, increase exercise, avoid trans fats, consider a krill oil cap daily 

## 2020-08-01 DIAGNOSIS — I5022 Chronic systolic (congestive) heart failure: Secondary | ICD-10-CM | POA: Diagnosis not present

## 2020-08-01 NOTE — Telephone Encounter (Signed)
Patient notified of lab results

## 2020-08-05 DIAGNOSIS — J0100 Acute maxillary sinusitis, unspecified: Secondary | ICD-10-CM | POA: Diagnosis not present

## 2020-08-10 ENCOUNTER — Other Ambulatory Visit: Payer: Self-pay

## 2020-08-10 ENCOUNTER — Encounter (HOSPITAL_BASED_OUTPATIENT_CLINIC_OR_DEPARTMENT_OTHER): Payer: Self-pay | Admitting: Emergency Medicine

## 2020-08-10 ENCOUNTER — Emergency Department (HOSPITAL_BASED_OUTPATIENT_CLINIC_OR_DEPARTMENT_OTHER)
Admission: EM | Admit: 2020-08-10 | Discharge: 2020-08-10 | Disposition: A | Discharge status: Home / Self Care | Payer: Medicare Other | Attending: Emergency Medicine | Admitting: Emergency Medicine

## 2020-08-10 ENCOUNTER — Emergency Department (HOSPITAL_BASED_OUTPATIENT_CLINIC_OR_DEPARTMENT_OTHER): Payer: Medicare Other

## 2020-08-10 DIAGNOSIS — Z7982 Long term (current) use of aspirin: Secondary | ICD-10-CM | POA: Diagnosis not present

## 2020-08-10 DIAGNOSIS — R059 Cough, unspecified: Secondary | ICD-10-CM | POA: Diagnosis not present

## 2020-08-10 DIAGNOSIS — I5041 Acute combined systolic (congestive) and diastolic (congestive) heart failure: Secondary | ICD-10-CM | POA: Insufficient documentation

## 2020-08-10 DIAGNOSIS — Z79899 Other long term (current) drug therapy: Secondary | ICD-10-CM | POA: Insufficient documentation

## 2020-08-10 DIAGNOSIS — Z23 Encounter for immunization: Secondary | ICD-10-CM | POA: Insufficient documentation

## 2020-08-10 DIAGNOSIS — Z950 Presence of cardiac pacemaker: Secondary | ICD-10-CM | POA: Insufficient documentation

## 2020-08-10 DIAGNOSIS — N281 Cyst of kidney, acquired: Secondary | ICD-10-CM | POA: Diagnosis not present

## 2020-08-10 DIAGNOSIS — R197 Diarrhea, unspecified: Secondary | ICD-10-CM | POA: Diagnosis present

## 2020-08-10 DIAGNOSIS — Z7951 Long term (current) use of inhaled steroids: Secondary | ICD-10-CM | POA: Diagnosis not present

## 2020-08-10 DIAGNOSIS — N3000 Acute cystitis without hematuria: Secondary | ICD-10-CM | POA: Insufficient documentation

## 2020-08-10 DIAGNOSIS — I110 Hypertensive heart disease with heart failure: Secondary | ICD-10-CM | POA: Insufficient documentation

## 2020-08-10 DIAGNOSIS — U071 COVID-19: Secondary | ICD-10-CM | POA: Insufficient documentation

## 2020-08-10 LAB — COMPREHENSIVE METABOLIC PANEL
ALT: 19 U/L (ref 0–44)
AST: 27 U/L (ref 15–41)
Albumin: 3.4 g/dL — ABNORMAL LOW (ref 3.5–5.0)
Alkaline Phosphatase: 45 U/L (ref 38–126)
Anion gap: 8 (ref 5–15)
BUN: 13 mg/dL (ref 8–23)
CO2: 24 mmol/L (ref 22–32)
Calcium: 8.3 mg/dL — ABNORMAL LOW (ref 8.9–10.3)
Chloride: 109 mmol/L (ref 98–111)
Creatinine, Ser: 0.62 mg/dL (ref 0.44–1.00)
GFR calc Af Amer: >60 mL/min (ref >60)
GFR calc non Af Amer: >60 mL/min (ref >60)
Glucose, Bld: 124 mg/dL — ABNORMAL HIGH (ref 70–99)
Potassium: 3.6 mmol/L (ref 3.5–5.1)
Sodium: 141 mmol/L (ref 135–145)
Total Bilirubin: 0.4 mg/dL (ref 0.3–1.2)
Total Protein: 8.3 g/dL — ABNORMAL HIGH (ref 6.5–8.1)

## 2020-08-10 LAB — CBC
HCT: 37.6 % (ref 36.0–46.0)
Hemoglobin: 12.2 g/dL (ref 12.0–15.0)
MCH: 29.3 pg (ref 26.0–34.0)
MCHC: 32.4 g/dL (ref 30.0–36.0)
MCV: 90.2 fL (ref 80.0–100.0)
Platelets: 111 10*3/uL — ABNORMAL LOW (ref 150–400)
RBC: 4.17 MIL/uL (ref 3.87–5.11)
RDW: 14.3 % (ref 11.5–15.5)
WBC: 2.2 10*3/uL — ABNORMAL LOW (ref 4.0–10.5)
nRBC: 0 % (ref 0.0–0.2)

## 2020-08-10 LAB — URINALYSIS, MICROSCOPIC (REFLEX): WBC, UA: >50 WBC/hpf (ref 0–5)

## 2020-08-10 LAB — RESPIRATORY PANEL BY RT PCR (FLU A&B, COVID)
Influenza A by PCR: NEGATIVE
Influenza B by PCR: NEGATIVE
SARS Coronavirus 2 by RT PCR: POSITIVE — AB

## 2020-08-10 LAB — URINALYSIS, ROUTINE W REFLEX MICROSCOPIC
Bilirubin Urine: NEGATIVE
Glucose, UA: NEGATIVE mg/dL
Ketones, ur: NEGATIVE mg/dL
Nitrite: POSITIVE — AB
Protein, ur: NEGATIVE mg/dL
Specific Gravity, Urine: 1.025 (ref 1.005–1.030)
pH: 6 (ref 5.0–8.0)

## 2020-08-10 LAB — LIPASE, BLOOD: Lipase: 41 U/L (ref 11–51)

## 2020-08-10 MED ORDER — ALBUTEROL SULFATE HFA 108 (90 BASE) MCG/ACT IN AERS: 2.0000 | INHALATION_SPRAY | Freq: Once | RESPIRATORY_TRACT | Status: DC | PRN

## 2020-08-10 MED ORDER — PROMETHAZINE HCL 25 MG PO TABS: 25.0000 mg | ORAL_TABLET | Freq: Four times a day (QID) | ORAL | 0 refills | Status: DC | PRN

## 2020-08-10 MED ORDER — IOHEXOL 300 MG/ML  SOLN
100.0000 mL | Freq: Once | INTRAMUSCULAR | Status: AC | PRN
  Administered 2020-08-10: 100 mL via INTRAVENOUS

## 2020-08-10 MED ORDER — SODIUM CHLORIDE 0.9 % IV SOLN
INTRAVENOUS | Status: DC | PRN
  Administered 2020-08-10 (×2): 500 mL via INTRAVENOUS

## 2020-08-10 MED ORDER — SODIUM CHLORIDE 0.9 % IV SOLN
1.0000 g | Freq: Once | INTRAVENOUS | Status: AC
  Administered 2020-08-10: 1 g via INTRAVENOUS
  Filled 2020-08-10: qty 10

## 2020-08-10 MED ORDER — CEPHALEXIN 500 MG PO CAPS: 500.0000 mg | ORAL_CAPSULE | Freq: Three times a day (TID) | ORAL | 0 refills | Status: AC

## 2020-08-10 MED ORDER — METHYLPREDNISOLONE SODIUM SUCC 125 MG IJ SOLR: 125.0000 mg | Freq: Once | INTRAMUSCULAR | Status: DC | PRN

## 2020-08-10 MED ORDER — PROMETHAZINE HCL 25 MG/ML IJ SOLN
12.5000 mg | Freq: Once | INTRAMUSCULAR | Status: AC
  Administered 2020-08-10: 12.5 mg via INTRAVENOUS
  Filled 2020-08-10: qty 1

## 2020-08-10 MED ORDER — SODIUM CHLORIDE 0.9 % IV SOLN
1200.0000 mg | Freq: Once | INTRAVENOUS | Status: AC
  Administered 2020-08-10: 1200 mg via INTRAVENOUS
  Filled 2020-08-10: qty 10

## 2020-08-10 MED ORDER — FAMOTIDINE IN NACL 20-0.9 MG/50ML-% IV SOLN: 20.0000 mg | Freq: Once | INTRAVENOUS | Status: DC | PRN

## 2020-08-10 MED ORDER — SODIUM CHLORIDE 0.9 % IV SOLN: INTRAVENOUS | Status: DC | PRN

## 2020-08-10 MED ORDER — DIPHENHYDRAMINE HCL 50 MG/ML IJ SOLN: 50.0000 mg | Freq: Once | INTRAMUSCULAR | Status: DC | PRN

## 2020-08-10 MED ORDER — EPINEPHRINE 0.3 MG/0.3ML IJ SOAJ: 0.3000 mg | Freq: Once | INTRAMUSCULAR | Status: DC | PRN

## 2020-08-10 MED ORDER — SODIUM CHLORIDE 0.9 % IV BOLUS
500.0000 mL | Freq: Once | INTRAVENOUS | Status: AC
  Administered 2020-08-10: 500 mL via INTRAVENOUS

## 2020-08-10 NOTE — ED Triage Notes (Signed)
Started abx for a "head cold" on Monday. Pt reports diarrhea since Tuesday. Pt states cold symptoms have resolved.

## 2020-08-10 NOTE — ED Notes (Signed)
Patient transported to CT 

## 2020-08-10 NOTE — Discharge Instructions (Signed)
Covid test was positive today.  You did receive the monoclonal antibody treatment that should help to lessen your symptoms.  Stop taking the Augmentin for her sinus infection as this could be the cause of your diarrhea  Your urine did show a urinary tract infection.  I have started you on a new antibiotic.  I have also written you for some Phenergan in case you become nauseous.  Close follow-up with your primary care provider  Return for new or worsening symptoms.

## 2020-08-10 NOTE — ED Provider Notes (Signed)
[facility] EMERGENCY DEPARTMENT Provider Note   CSN: [id number] Arrival date & time: 08/10/20  1050    History Chief Complaint  Patient presents with  . Diarrhea    Tami [name] is a 64 y.o. female with past medical history significant for C. difficile colitis, CHF, last EF 30, recurrent colitis multiple myeloma, hypertension, hyperlipidemia who presents for evaluation of diarrhea.  Patient states on Monday she was seen by PCP for a "head cold."  He started her on amoxicillin.  Patient states the next day she reported multiple episodes of watery, loose stool.  Denies any melena or blood per rectum.  Patient states her "cold" symptoms have significantly improved since she started on the amoxicillin.  Tomorrow is the last day of this medication.  Patient states she is feels overall fatigued.  She has been tolerating p.o. intake at home however has decreased appetite.  Has had some nausea without emesis.  No headache, lightheadedness, dizziness, sore throat, chest pain, shortness of breath, hematuria, rashes or lesions.  Has chronic generalized abdominal pain which her PCP relates to reflux, was started on Carafate.  Pain is not worse with food intake.  Has noticed some burning with urination over the last 2 days, patient thought she was getting a yeast infection due to her antibiotics.  Took dose of fluconazole from PCP, per patient.  Denies any additional aggravating or alleviating factors.  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  She received both doses of her Covid vaccines.  She has not gotten her booster.  History obtained from patient and past medical records.  No interpreter used.   HPI     Past Medical History:  Diagnosis Date  . Abnormal SPEP 07/26/2016  . Arthritis    knees, hands  . Back pain 07/14/2016  . Bowel obstruction ([place]) 11/2014  . C. difficile diarrhea   . CHF  (congestive heart failure) ([place])   . Colitis   . Congestive heart failure ([place]) 02/24/2015   S/p pacemaker  . Depression   . Elevated sed rate 08/15/2017  . Epigastric pain 03/22/2017  . GERD (gastroesophageal reflux disease)   . Heart disease   . Hyperlipidemia   . Hypertension   . 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 Low back pain 07/14/2016  . 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 Multiple myeloma ([place]) 07/20/2018  . Multiple myeloma not having achieved remission ([place]) 07/20/2018  . Myalgia 12/31/2016  . Obstructive sleep apnea 03/31/2015  . SOB (shortness of breath) 04/09/2016  . Stroke ([place])    TIAs  . UTI (urinary tract infection)     Patient Active Problem List   Diagnosis Date Noted  . Low vitamin D level 07/31/2020  . Right ear pain 06/05/2020  . Right otitis media with effusion 06/05/2020  . Acute combined systolic and diastolic heart failure ([place]) 05/09/2020  . Urinary tract infection without hematuria 04/14/2020  . Low magnesium level 03/04/2020  . Thrombocytopenia ([place]) 03/04/2020  . Dysphagia 03/04/2020  . Constipation 03/04/2020  . Sensation of pressure in bladder area 01/17/2020  . Hyperglycemia 01/17/2020  . Peripheral neuropathy 11/15/2019  . Chronic migraine without aura, with intractable migraine, so stated, with status migrainosus 10/17/2019  . ETD (Eustachian tube dysfunction), bilateral 09/12/2019  . Urinary frequency 07/17/2019  . Headache 07/17/2019  . Palpitation 05/15/2019  . Urticaria 02/22/2019  . Conjunctivitis 10/17/2018  .  Diarrhea 09/04/2018  . Thyroid nodule 08/26/2018  . Drug rash 08/26/2018  . Multiple myeloma ([place]) 07/20/2018  . Multiple myeloma not having achieved remission ([place]) 07/20/2018  . Hematuria 07/11/2018  . Seasonal allergies 06/23/2018  . Right shoulder pain 06/07/2018  . Neck pain 06/07/2018  . Paresthesias 06/07/2018  . 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 Pain in thoracic spine  03/18/2018  . Thoracic back pain 02/15/2018  . 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 Cystitis 10/25/2017  . C. difficile colitis 09/15/2017  . Hypokalemia 08/20/2017  . Anemia 08/20/2017  . Elevated sed rate 08/15/2017  . 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 Epigastric pain 03/22/2017  . Myalgia 12/31/2016  . SOB (shortness of breath) 07/26/2016  . Abnormal SPEP 07/26/2016  . Low back pain 07/14/2016  . Insomnia 12/29/2015  . Tension headache 10/21/2015  . Muscle spasms of neck 10/18/2015  . Dysuria 10/13/2015  . Sinusitis 07/07/2015  . AP (abdominal pain) 07/07/2015  . Cardiomyopathy ([place]) 04/19/2015  . Chest wall pain 04/19/2015  . Obstructive sleep apnea 03/31/2015  . Congestive heart failure ([place]) 02/24/2015  . Arthritis   . Hyperlipidemia   . Hypertension   . Depression   . Stroke ([place])   . GERD (gastroesophageal reflux disease)   . Bowel obstruction ([place]) 11/09/2014    Past Surgical History:  Procedure Laterality Date  . ABDOMINAL HYSTERECTOMY     menorraghia, 2006, total  . CHOLECYSTECTOMY    . COLONOSCOPY  2018   [facility] per patient  . ESOPHAGOGASTRODUODENOSCOPY  2018   [facility]   . KNEE SURGERY     right, repair torn torn cartialage  . PACEMAKER INSERTION  08/2014  . TONSILLECTOMY    . TUBAL LIGATION       OB History   No obstetric history on file.     Family History  Problem Relation Age of Onset  . Diabetes Mother   . Hypertension Mother   . Heart disease Mother        s/p 1 stent  . Hyperlipidemia Mother   . Arthritis Mother   . Cancer Father        COLON  . Colon cancer Father 34  . Irritable bowel syndrome Sister   . Hyperlipidemia Daughter   . Hypertension Daughter   . Hypertension Maternal Grandmother   . Arthritis Maternal Grandmother   . Heart disease Maternal Grandfather        MI  . Hypertension Maternal Grandfather   . Arthritis Maternal Grandfather   .  Hypertension Son   . Esophageal cancer Neg Hx     Social History   Tobacco Use  . Smoking status: Never Smoker  . Smokeless tobacco: Never Used  Vaping Use  . Vaping Use: Never used  Substance Use Topics  . Alcohol use: No  . Drug use: No    Home Medications Prior to Admission medications   Medication Sig Start Date End Date Taking? Authorizing Provider  albuterol (PROVENTIL HFA;VENTOLIN HFA) 108 (90 Base) MCG/ACT inhaler Inhale 2 puffs into the lungs every 6 (six) hours as needed for wheezing or shortness of breath. 08/18/18   Saguier, Percell Miller, PA-C  aspirin EC 81 MG tablet Take 1 tablet (81 mg total) by mouth daily. 02/15/18   Mosie Lukes, MD  carvedilol (COREG) 25 MG tablet Take 1 tablet (25 mg total) by mouth 2 (two) times daily with a meal. 06/04/20   Mosie Lukes, MD  cephALEXin (KEFLEX) 500 MG capsule Take 1 capsule (500 mg total) by mouth 3 (three) times daily for 7 days. 08/10/20 08/17/20  Shaasia Odle A, PA-C  Cholecalciferol (VITAMIN D-3) 25 MCG (1000 UT) CAPS Take 1 capsule (1,000 Units total) by mouth daily. 05/03/20   Mosie Lukes, MD  conjugated estrogens (PREMARIN) vaginal cream Place 1 Applicatorful vaginally as needed.  01/31/14   [provider]  cyclobenzaprine (FLEXERIL) 10 MG tablet Take 1 tablet (10 mg total) by mouth at bedtime. 02/19/20   Saguier, Percell Miller, PA-C  fluticasone (FLONASE) 50 MCG/ACT nasal spray Place 2 sprays into both nostrils daily. 01/07/20   Joy, Shawn C, PA-C  furosemide (LASIX) 20 MG tablet Take 1 tablet by mouth as directed. 07/03/20   [provider]  losartan (COZAAR) 25 MG tablet Take by mouth. 05/06/20   [provider]  magnesium oxide (MAG-OX) 400 MG tablet Take 1 tablet (400 mg total) by mouth 2 (two) times daily. 03/04/20   Mosie Lukes, MD  meclizine (ANTIVERT) 12.5 MG tablet Take 12.5 mg by mouth 3 (three) times daily. 02/18/20   [provider]  melatonin 3 MG TABS tablet Take 3 mg by mouth at  bedtime.    [provider]  montelukast (SINGULAIR) 10 MG tablet Take 1 tablet (10 mg total) by mouth at bedtime. Start taking Singulair on 11/16/2019. 10/31/19   [name], MD  Multiple Vitamins-Minerals (MULTIVITAMIN ADULT PO) Take 1 tablet by mouth daily.     [provider]  Naproxen 375 MG TBEC Take 1 tablet (375 mg total) by mouth every morning. 02/26/20   Mosie Lukes, MD  ondansetron (ZOFRAN) 4 MG tablet Take 1 tablet (4 mg total) by mouth every 8 (eight) hours as needed for nausea or vomiting. 04/14/18   Mosie Lukes, MD  pantoprazole (PROTONIX) 40 MG tablet Take 1 tablet (40 mg total) by mouth 2 (two) times daily. 02/15/18   Mosie Lukes, MD  potassium chloride SA (KLOR-CON) 20 MEQ tablet Take 1 tablet (20 mEq total) by mouth daily. 03/05/20   Mosie Lukes, MD  promethazine (PHENERGAN) 25 MG tablet Take 1 tablet (25 mg total) by mouth every 6 (six) hours as needed for nausea or vomiting. 08/10/20   Eleftherios Dudenhoeffer A, PA-C  spironolactone (ALDACTONE) 25 MG tablet Take 1 tablet by mouth daily. 05/08/20   [provider]  sucralfate (CARAFATE) 1 g tablet Take 1 tablet (1 g total) by mouth 3 (three) times daily as needed. prn 07/30/20   Mosie Lukes, MD  topiramate (TOPAMAX) 50 MG tablet Take 1 tablet (50 mg total) by mouth 2 (two) times daily. 07/13/19   Mosie Lukes, MD    Allergies    Benazepril, Ondansetron hcl, Codeine, and Morphine  Review of Systems   Review of Systems  Constitutional: Positive for appetite change and fatigue. Negative for activity change, chills, diaphoresis and fever.  HENT: Positive for congestion, postnasal drip, rhinorrhea and sinus pain. Negative for sinus pressure, sneezing, sore throat, trouble swallowing and voice change.   Respiratory: Positive for cough (Resolved). Negative for apnea, choking, chest tightness, shortness of breath, wheezing and stridor.   Cardiovascular: Negative.   Gastrointestinal: Positive for  abdominal pain (Generalized, chronic per patient), diarrhea, nausea and vomiting. Negative for abdominal distention, anal bleeding, blood in stool, constipation and rectal pain.  Genitourinary: Negative.   Musculoskeletal: Negative.   Skin: Negative.   Neurological: Positive for weakness (Generalized). Negative for dizziness,  tremors, seizures, syncope, facial asymmetry, speech difficulty, light-headedness, numbness and headaches.  All other systems reviewed and are negative.   Physical Exam Updated Vital Signs BP (!) 149/98   Pulse 89   Temp 99 F (37.2 C) (Oral)   Resp 17   Ht $R'5\' 6"'Ar$  (1.676 m)   Wt 88.9 kg   SpO2 98%   BMI 31.64 kg/m   Physical Exam Vitals and nursing note reviewed.  Constitutional:      General: She is not in acute distress.    Appearance: She is well-developed. She is not ill-appearing, toxic-appearing or diaphoretic.  HENT:     Head: Normocephalic and atraumatic.     Nose: Nose normal.     Mouth/Throat:     Mouth: Mucous membranes are dry.  Eyes:     Pupils: Pupils are equal, round, and reactive to light.  Cardiovascular:     Rate and Rhythm: Normal rate.     Pulses: Normal pulses.     Heart sounds: Normal heart sounds.  Pulmonary:     Effort: Pulmonary effort is normal. No respiratory distress.     Breath sounds: Normal breath sounds and air entry.     Comments: Clear to auscultation bilaterally.  Speaks in full sentences without difficulty Abdominal:     General: Bowel sounds are normal. There is no distension.     Palpations: Abdomen is soft.     Tenderness: There is generalized abdominal tenderness. There is no right CVA tenderness, left CVA tenderness, guarding or rebound. Negative signs include Murphy's sign and McBurney's sign.     Hernia: No hernia is present.  Musculoskeletal:        General: Normal range of motion.     Cervical back: Normal range of motion.     Comments: Moves is all 4 extremities without difficulty.  No bony tenderness.   Compartments soft  Skin:    General: Skin is warm and dry.     Capillary Refill: Capillary refill takes less than 2 seconds.     Comments: No edema, erythema or warmth.  No fluctuance or induration.  Neurological:     General: No focal deficit present.     Mental Status: She is alert.     Cranial Nerves: Cranial nerves are intact.     Sensory: Sensation is intact.     Motor: Motor function is intact.     Coordination: Coordination is intact.     Gait: Gait is intact.     Comments: Cranial nerves II through XII grossly intact Intact sensation Ambulatory with out difficulty 5/5 strength bilateral without difficulty     ED Results / Procedures / Treatments   Labs (all labs ordered are listed, but only abnormal results are displayed) Labs Reviewed  RESPIRATORY PANEL BY RT PCR (FLU A&B, COVID) - Abnormal; Notable for the following components:      Result Value   SARS Coronavirus 2 by RT PCR POSITIVE (*)    All other components within normal limits  COMPREHENSIVE METABOLIC PANEL - Abnormal; Notable for the following components:   Glucose, Bld 124 (*)    Calcium 8.3 (*)    Total Protein 8.3 (*)    Albumin 3.4 (*)    All other components within normal limits  CBC - Abnormal; Notable for the following components:   WBC 2.2 (*)    Platelets 111 (*)    All other components within normal limits  URINALYSIS, ROUTINE W REFLEX MICROSCOPIC - Abnormal; Notable for  the following components:   APPearance CLOUDY (*)    Hgb urine dipstick TRACE (*)    Nitrite POSITIVE (*)    Leukocytes,Ua SMALL (*)    All other components within normal limits  URINALYSIS, MICROSCOPIC (REFLEX) - Abnormal; Notable for the following components:   Bacteria, UA MANY (*)    All other components within normal limits  LIPASE, BLOOD    EKG None  Radiology CT Abdomen Pelvis W Contrast  Result Date: 08/10/2020 CLINICAL DATA:  Abdominal pain, diarrhea, weakness, cough. COVID positive. EXAM: CT ABDOMEN AND  PELVIS WITH CONTRAST TECHNIQUE: Multidetector CT imaging of the abdomen and pelvis was performed using the standard protocol following bolus administration of intravenous contrast. CONTRAST:  168mL OMNIPAQUE IOHEXOL 300 MG/ML  SOLN COMPARISON:  01/19/2019 FINDINGS: Lower chest: Very mild subpleural patchy opacity in the bilateral lower lobes, nonspecific. Hepatobiliary: Subcentimeter cyst in the right hepatic lobe (series 2/image 20), benign. Status post cholecystectomy. No intrahepatic or extrahepatic ductal dilatation. Pancreas: Within normal limits. Spleen: Within no limits. Adrenals/Urinary Tract: Adrenal glands are within normal limits. Bilateral renal cysts, measuring up to 2.3 cm in the interpolar right kidney (series 2/image 33). No hydronephrosis. Bladder is within normal limits. Stomach/Bowel: Stomach is within normal limits. No evidence of bowel obstruction. Normal appendix (series 2/image 56). Vascular/Lymphatic: No evidence of abdominal aortic aneurysm. No suspicious abdominopelvic lymphadenopathy. Reproductive: Status post hysterectomy.  No adnexal mass. Other: No abdominopelvic ascites. Musculoskeletal: Visualized osseous structures are within normal limits. IMPRESSION: Unremarkable CT abdomen/pelvis. Very mild patchy opacities in the bilateral lower lobes, possibly related to the patient's known COVID pneumonia. Electronically Signed   By: Julian Hy M.D.   On: 08/10/2020 13:57    Procedures Procedures (including critical care time)  Medications Ordered in ED Medications  0.9 %  sodium chloride infusion (500 mLs Intravenous New Bag/Given 08/10/20 1458)  0.9 %  sodium chloride infusion (has no administration in time range)  diphenhydrAMINE (BENADRYL) injection 50 mg (has no administration in time range)  famotidine (PEPCID) IVPB 20 mg premix (has no administration in time range)  methylPREDNISolone sodium succinate (SOLU-MEDROL) 125 mg/2 mL injection 125 mg (has no administration in  time range)  albuterol (VENTOLIN HFA) 108 (90 Base) MCG/ACT inhaler 2 puff (has no administration in time range)  EPINEPHrine (EPI-PEN) injection 0.3 mg (has no administration in time range)  sodium chloride 0.9 % bolus 500 mL ( Intravenous Stopped 08/10/20 1449)  promethazine (PHENERGAN) injection 12.5 mg (12.5 mg Intravenous Given 08/10/20 1313)  iohexol (OMNIPAQUE) 300 MG/ML solution 100 mL (100 mLs Intravenous Contrast Given 08/10/20 1320)  cefTRIAXone (ROCEPHIN) 1 g in sodium chloride 0.9 % 100 mL IVPB ( Intravenous Stopped 08/10/20 1429)  casirivimab-imdevimab (REGEN-COV) 1,200 mg in sodium chloride 0.9 % 110 mL IVPB ( Intravenous Stopped 08/10/20 1522)    ED Course  I have reviewed the triage vital signs and the nursing notes.  Pertinent labs & imaging results that were available during my care of the patient were reviewed by me and considered in my medical decision making (see chart for details).   64 year old female appears otherwise well presents for evaluation of diarrhea.  She is afebrile, nonseptic, non-ill-appearing.  She is completing a course of Augmentin for possible sinusitis.  She has not had an outpatient Covid test.  States she did have cough, congestion and rhinorrhea however her symptoms have improved.  She has no associate abdominal pain.  She does have some mild dysuria without hematuria.  Does have history of C.  difficile colitis however patient states this "feels different" as she is only having 1-2 episodes of loose stool daily without any melena or bright red per rectum.  Plan on labs, imaging and reassess  Labs and imaging personally reviewed and interpreted: Urinalysis positive for UTI COVID positive CBC leukopenia consistent with Covid infection Metabolic panel with mild hyperglycemia to 124 Lipase 41 CT abdomen and pelvis without any evidence of colitis, diverticulitis.  Lower lobes of lungs do show pneumonia consistent with her Covid infection, patient is without  tachycardia, tachypnea or hypoxia.  She is ambulatory without any hypoxia.  Low suspicion for C. difficile colitis as cause of her diarrhea as she is only having 1-2 episodes daily.  Likely from her Augmentin versus Covid infection.  Reassuring CT scan.  She has not been able to provide stool sample here in the emergency department.  Patient given Rocephin for her UTI.  Will DC home with Keflex.  Patient also given monoclonal antibody treatment.  Was observed for 1 hour post treatment without any evidence of reaction.  Patient does not meet the SIRS or Sepsis criteria.  On repeat exam patient does not have a surgical abdomin and there are no peritoneal signs.  No indication of appendicitis, bowel obstruction, bowel perforation, cholecystitis, diverticulitis, PID or ectopic pregnancy.   Symptomatic management at home for UTI as well as her Covid infection.  Discussed return precautions.  Patient voiced understanding and is agreeable for follow-up.  The patient has been appropriately medically screened and/or stabilized in the ED. I have low suspicion for any other emergent medical condition which would require further screening, evaluation or treatment in the ED or require inpatient management.  Patient is hemodynamically stable and in no acute distress.  Patient able to ambulate in department prior to ED.  Evaluation does not show acute pathology that would require ongoing or additional emergent interventions while in the emergency department or further inpatient treatment.  I have discussed the diagnosis with the patient and answered all questions.  Pain is been managed while in the emergency department and patient has no further complaints prior to discharge.  Patient is comfortable with plan discussed in room and is stable for discharge at this time.  I have discussed strict return precautions for returning to the emergency department.  Patient was encouraged to follow-up with PCP/specialist refer to  at discharge.    MDM Rules/Calculators/A&P                          Tami [name] was evaluated in Emergency Department on 08/10/2020 for the symptoms described in the history of present illness. She was evaluated in the context of the global COVID-19 pandemic, which necessitated consideration that the patient might be at risk for infection with the SARS-CoV-2 virus that causes COVID-19. Institutional protocols and algorithms that pertain to the evaluation of patients at risk for COVID-19 are in a state of rapid change based on information released by regulatory bodies including the CDC and federal and state organizations. These policies and algorithms were followed during the patient's care in the ED. Final Clinical Impression(s) / ED Diagnoses Final diagnoses:  COVID-19  Acute cystitis without hematuria    Rx / DC Orders ED Discharge Orders         Ordered    cephALEXin (KEFLEX) 500 MG capsule  3 times daily        08/10/20 1657    promethazine (PHENERGAN) 25 MG tablet  Every 6 hours PRN        08/10/20 1657           Maribell Demeo A, PA-C 08/10/20 1703    Maudie Flakes, MD 08/11/20 [id number]

## 2020-08-10 NOTE — ED Notes (Signed)
Pt c/o generalized weakness

## 2020-08-13 ENCOUNTER — Telehealth: Payer: Self-pay | Admitting: Family Medicine

## 2020-08-13 NOTE — Telephone Encounter (Signed)
Tami [name] Tested Positive for Covid on Saturday. She states that she was also treated for UTI while she was there. . She is still fully awful and needs some advise on what she should do to help with her fatigue. She said she's not in any pain  But she's very fatigue and off balance . She wants to know if Dr. B can call her to discuss what options she have.Marland Kitchen

## 2020-08-14 NOTE — Telephone Encounter (Signed)
Spoke with patient and she stated that she was given something for covid.    I checked patient chart and it stated this "Patient also given monoclonal antibody treatment."  I this the same as the infusion?  I think they done it over an hour time.

## 2020-08-14 NOTE — Telephone Encounter (Signed)
Notify and set her up for a MAB infusion if she is willing she is high risk then set her up on a video visit on Friday

## 2020-08-14 NOTE — Telephone Encounter (Signed)
Yes that is essentially the same thing.

## 2020-08-15 NOTE — Telephone Encounter (Signed)
Great.  With speaking with her yesterday she only has fatigue and no pain.   Does she really need to do anything or what recommendations you have?

## 2020-08-15 NOTE — Telephone Encounter (Signed)
No for the fatigue just increase rest, hydration and eat a high protein, low processed food diet. Also take a multivitamin with minerals. Vitamin D 2000 IU and fish or krill oil daily and let us know if symptoms worsen

## 2020-08-16 DIAGNOSIS — G4733 Obstructive sleep apnea (adult) (pediatric): Secondary | ICD-10-CM | POA: Diagnosis not present

## 2020-08-16 NOTE — Telephone Encounter (Signed)
Patient notified

## 2020-08-23 ENCOUNTER — Encounter (HOSPITAL_BASED_OUTPATIENT_CLINIC_OR_DEPARTMENT_OTHER): Payer: Self-pay | Admitting: *Deleted

## 2020-08-23 ENCOUNTER — Other Ambulatory Visit: Payer: Self-pay

## 2020-08-23 ENCOUNTER — Emergency Department (HOSPITAL_BASED_OUTPATIENT_CLINIC_OR_DEPARTMENT_OTHER)
Admission: EM | Admit: 2020-08-23 | Discharge: 2020-08-23 | Disposition: A | Discharge status: Home / Self Care | Payer: Medicare Other | Attending: Emergency Medicine | Admitting: Emergency Medicine

## 2020-08-23 DIAGNOSIS — Z79899 Other long term (current) drug therapy: Secondary | ICD-10-CM | POA: Insufficient documentation

## 2020-08-23 DIAGNOSIS — Z950 Presence of cardiac pacemaker: Secondary | ICD-10-CM | POA: Insufficient documentation

## 2020-08-23 DIAGNOSIS — N3000 Acute cystitis without hematuria: Secondary | ICD-10-CM

## 2020-08-23 DIAGNOSIS — Z7982 Long term (current) use of aspirin: Secondary | ICD-10-CM | POA: Diagnosis not present

## 2020-08-23 DIAGNOSIS — I5041 Acute combined systolic (congestive) and diastolic (congestive) heart failure: Secondary | ICD-10-CM | POA: Diagnosis not present

## 2020-08-23 DIAGNOSIS — I10 Essential (primary) hypertension: Secondary | ICD-10-CM | POA: Diagnosis not present

## 2020-08-23 DIAGNOSIS — R42 Dizziness and giddiness: Secondary | ICD-10-CM | POA: Diagnosis present

## 2020-08-23 DIAGNOSIS — I110 Hypertensive heart disease with heart failure: Secondary | ICD-10-CM | POA: Insufficient documentation

## 2020-08-23 LAB — BASIC METABOLIC PANEL
Anion gap: 8 (ref 5–15)
BUN: 12 mg/dL (ref 8–23)
CO2: 24 mmol/L (ref 22–32)
Calcium: 9 mg/dL (ref 8.9–10.3)
Chloride: 105 mmol/L (ref 98–111)
Creatinine, Ser: 0.58 mg/dL (ref 0.44–1.00)
GFR, Estimated: >60 mL/min (ref >60)
Glucose, Bld: 106 mg/dL — ABNORMAL HIGH (ref 70–99)
Potassium: 3.4 mmol/L — ABNORMAL LOW (ref 3.5–5.1)
Sodium: 137 mmol/L (ref 135–145)

## 2020-08-23 LAB — WET PREP, GENITAL
Clue Cells Wet Prep HPF POC: NONE SEEN
Sperm: NONE SEEN
Trich, Wet Prep: NONE SEEN
Yeast Wet Prep HPF POC: NONE SEEN

## 2020-08-23 LAB — CBC WITH DIFFERENTIAL/PLATELET
Abs Immature Granulocytes: 0 10*3/uL (ref 0.00–0.07)
Basophils Absolute: 0 10*3/uL (ref 0.0–0.1)
Basophils Relative: 1 %
Eosinophils Absolute: 0 10*3/uL (ref 0.0–0.5)
Eosinophils Relative: 2 %
HCT: 39.7 % (ref 36.0–46.0)
Hemoglobin: 13 g/dL (ref 12.0–15.0)
Immature Granulocytes: 0 %
Lymphocytes Relative: 41 %
Lymphs Abs: 1 10*3/uL (ref 0.7–4.0)
MCH: 29.3 pg (ref 26.0–34.0)
MCHC: 32.7 g/dL (ref 30.0–36.0)
MCV: 89.4 fL (ref 80.0–100.0)
Monocytes Absolute: 0.2 10*3/uL (ref 0.1–1.0)
Monocytes Relative: 8 %
Neutro Abs: 1.2 10*3/uL — ABNORMAL LOW (ref 1.7–7.7)
Neutrophils Relative %: 48 %
Platelets: 108 10*3/uL — ABNORMAL LOW (ref 150–400)
RBC: 4.44 MIL/uL (ref 3.87–5.11)
RDW: 14.1 % (ref 11.5–15.5)
WBC: 2.5 10*3/uL — ABNORMAL LOW (ref 4.0–10.5)
nRBC: 0 % (ref 0.0–0.2)

## 2020-08-23 LAB — TROPONIN I (HIGH SENSITIVITY)
Troponin I (High Sensitivity): 15 ng/L (ref <18)
Troponin I (High Sensitivity): 17 ng/L (ref <18)

## 2020-08-23 LAB — URINALYSIS, ROUTINE W REFLEX MICROSCOPIC
Bilirubin Urine: NEGATIVE
Glucose, UA: NEGATIVE mg/dL
Ketones, ur: NEGATIVE mg/dL
Nitrite: NEGATIVE
Protein, ur: NEGATIVE mg/dL
Specific Gravity, Urine: 1.025 (ref 1.005–1.030)
pH: 5.5 (ref 5.0–8.0)

## 2020-08-23 LAB — HIV ANTIBODY (ROUTINE TESTING W REFLEX): HIV Screen 4th Generation wRfx: NONREACTIVE

## 2020-08-23 LAB — URINALYSIS, MICROSCOPIC (REFLEX)

## 2020-08-23 MED ORDER — SULFAMETHOXAZOLE-TRIMETHOPRIM 800-160 MG PO TABS: 1.0000 | ORAL_TABLET | Freq: Two times a day (BID) | ORAL | 0 refills | Status: AC

## 2020-08-23 NOTE — Discharge Instructions (Addendum)
Lab work and imaging all looks reassuring.  I prescribed you antibiotics for your UTI.  Please take as prescribed.  Please continue stay hydrated as this will help with your symptoms.  I want you to follow-up with your PCP for further evaluation management.  Come back to emergency department develop fevers, chills, difficulty with urination, pain with urination, blood in your urine, chest pain, shortness of breath, severe abdominal pain, uncontrolled nausea, vomiting and diarrhea.

## 2020-08-23 NOTE — ED Notes (Signed)
Pt reports recent tx for UTI, denies pain, does report generalized weakness.  Denies sexually active.

## 2020-08-23 NOTE — ED Provider Notes (Signed)
[facility] EMERGENCY DEPARTMENT Provider Note   CSN: [id number] Arrival date & time: 08/23/20  1047     History No chief complaint on file.   Tami [name] is a 64 y.o. female.  HPI   Patient with significant medical history of  C. difficile, colitis, CHF with last EF 30, recurrent colitis multiple myeloma, hypertension hyperlipidemia presents to the emergency department with chief complaint of recurrent UTI as well as dizziness.  Patient states she was seen in the emergency department on 10/02, she was diagnosed with Covid as well as acute cystitis.  She received the COVID infusion as well as Rocephin and placed on Keflex outpatient.  She is here today because she states she has some itchiness in her vagina, she denies vaginal bleeding or discharge, dysuria, urinary urgency, frequency, hematuria, abdominal pain, nausea or vomiting.  Patient does endorse that she is sexually active and does not remember the last time she had an STD check.  Patient states she has had a yeast infection and states this feels, like that.  She also endorses that she has felt like her "mood is off" and feeling dizzy.  She states she felt dizzy for the last few days particularly when she goes from a seated position to a standing position.  She states she normally gets this during her migraines and will feel better after she has Tylenol lays down.  She denies chest pain, shortness of breath, becoming diaphoretic, nausea or vomiting.  She denies recent falls, is not on a blood thinner.  Patient denies fever, chills, shortness of breath, chest pain, dumping, nausea, vomiting, diarrhea, worsening pedal edema.  Past Medical History:  Diagnosis Date  . Abnormal SPEP 07/26/2016  . Arthritis    knees, hands  . Back pain 07/14/2016  . Bowel obstruction ([place]) 11/2014  . C. difficile diarrhea   . CHF (congestive heart failure) ([place])   . Colitis   . Congestive heart failure ([place]) 02/24/2015   S/p pacemaker  .  Depression   . Elevated sed rate 08/15/2017  . Epigastric pain 03/22/2017  . GERD (gastroesophageal reflux disease)   . Heart disease   . Hyperlipidemia   . Hypertension   . 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 Low back pain 07/14/2016  . 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 Multiple myeloma ([place]) 07/20/2018  . Multiple myeloma not having achieved remission ([place]) 07/20/2018  . Myalgia 12/31/2016  . Obstructive sleep apnea 03/31/2015  . SOB (shortness of breath) 04/09/2016  . Stroke ([place])    TIAs  . UTI (urinary tract infection)     Patient Active Problem List   Diagnosis Date Noted  . Low vitamin D level 07/31/2020  . Right ear pain 06/05/2020  . Right otitis media with effusion 06/05/2020  . Acute combined systolic and diastolic heart failure ([place]) 05/09/2020  . Urinary tract infection without hematuria 04/14/2020  . Low magnesium level 03/04/2020  . Thrombocytopenia ([place]) 03/04/2020  . Dysphagia 03/04/2020  . Constipation 03/04/2020  . Sensation of pressure in bladder area 01/17/2020  . Hyperglycemia 01/17/2020  . Peripheral neuropathy 11/15/2019  . Chronic migraine without aura, with intractable migraine, so stated, with status migrainosus 10/17/2019  . ETD (Eustachian tube dysfunction), bilateral 09/12/2019  . Urinary frequency 07/17/2019  . Headache 07/17/2019  . Palpitation 05/15/2019  . Urticaria 02/22/2019  . Conjunctivitis 10/17/2018  . Diarrhea 09/04/2018  . Thyroid nodule 08/26/2018  . Drug rash 08/26/2018  . Multiple myeloma ([place]) 07/20/2018  .  Multiple myeloma not having achieved remission ([place]) 07/20/2018  . Hematuria 07/11/2018  . Seasonal allergies 06/23/2018  . Right shoulder pain 06/07/2018  . Neck pain 06/07/2018  . Paresthesias 06/07/2018  . 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 Pain in thoracic spine 03/18/2018  . Thoracic back pain 02/15/2018  . 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 Cystitis 10/25/2017  . C.  difficile colitis 09/15/2017  . Hypokalemia 08/20/2017  . Anemia 08/20/2017  . Elevated sed rate 08/15/2017  . 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 Epigastric pain 03/22/2017  . Myalgia 12/31/2016  . SOB (shortness of breath) 07/26/2016  . Abnormal SPEP 07/26/2016  . Low back pain 07/14/2016  . Insomnia 12/29/2015  . Tension headache 10/21/2015  . Muscle spasms of neck 10/18/2015  . Dysuria 10/13/2015  . Sinusitis 07/07/2015  . AP (abdominal pain) 07/07/2015  . Cardiomyopathy ([place]) 04/19/2015  . Chest wall pain 04/19/2015  . Obstructive sleep apnea 03/31/2015  . Congestive heart failure ([place]) 02/24/2015  . Arthritis   . Hyperlipidemia   . Hypertension   . Depression   . Stroke ([place])   . GERD (gastroesophageal reflux disease)   . Bowel obstruction ([place]) 11/09/2014    Past Surgical History:  Procedure Laterality Date  . ABDOMINAL HYSTERECTOMY     menorraghia, 2006, total  . CHOLECYSTECTOMY    . COLONOSCOPY  2018   [facility] per patient  . ESOPHAGOGASTRODUODENOSCOPY  2018   [facility]   . KNEE SURGERY     right, repair torn torn cartialage  . PACEMAKER INSERTION  08/2014  . TONSILLECTOMY    . TUBAL LIGATION       OB History   No obstetric history on file.     Family History  Problem Relation Age of Onset  . Diabetes Mother   . Hypertension Mother   . Heart disease Mother        s/p 1 stent  . Hyperlipidemia Mother   . Arthritis Mother   . Cancer Father        COLON  . Colon cancer Father 36  . Irritable bowel syndrome Sister   . Hyperlipidemia Daughter   . Hypertension Daughter   . Hypertension Maternal Grandmother   . Arthritis Maternal Grandmother   . Heart disease Maternal Grandfather        MI  . Hypertension Maternal Grandfather   . Arthritis Maternal Grandfather   . Hypertension Son   . Esophageal cancer Neg Hx     Social History   Tobacco Use  . Smoking status: Never Smoker  .  Smokeless tobacco: Never Used  Vaping Use  . Vaping Use: Never used  Substance Use Topics  . Alcohol use: No  . Drug use: No    Home Medications Prior to Admission medications   Medication Sig Start Date End Date Taking? Authorizing Provider  albuterol (PROVENTIL HFA;VENTOLIN HFA) 108 (90 Base) MCG/ACT inhaler Inhale 2 puffs into the lungs every 6 (six) hours as needed for wheezing or shortness of breath. 08/18/18   Saguier, Percell Miller, PA-C  aspirin EC 81 MG tablet Take 1 tablet (81 mg total) by mouth daily. 02/15/18   Mosie Lukes, MD  carvedilol (COREG) 25 MG tablet Take 1 tablet (25 mg total) by mouth 2 (two) times daily with a meal. 06/04/20   Mosie Lukes, MD  Cholecalciferol (VITAMIN D-3) 25 MCG (1000 UT) CAPS Take 1 capsule (1,000 Units total) by mouth daily. 05/03/20  Mosie Lukes, MD  conjugated estrogens (PREMARIN) vaginal cream Place 1 Applicatorful vaginally as needed.  01/31/14   [provider]  cyclobenzaprine (FLEXERIL) 10 MG tablet Take 1 tablet (10 mg total) by mouth at bedtime. 02/19/20   Saguier, Percell Miller, PA-C  fluticasone (FLONASE) 50 MCG/ACT nasal spray Place 2 sprays into both nostrils daily. 01/07/20   Joy, Shawn C, PA-C  furosemide (LASIX) 20 MG tablet Take 1 tablet by mouth as directed. 07/03/20   [provider]  losartan (COZAAR) 25 MG tablet Take by mouth. 05/06/20   [provider]  magnesium oxide (MAG-OX) 400 MG tablet Take 1 tablet (400 mg total) by mouth 2 (two) times daily. 03/04/20   Mosie Lukes, MD  meclizine (ANTIVERT) 12.5 MG tablet Take 12.5 mg by mouth 3 (three) times daily. 02/18/20   [provider]  melatonin 3 MG TABS tablet Take 3 mg by mouth at bedtime.    [provider]  montelukast (SINGULAIR) 10 MG tablet Take 1 tablet (10 mg total) by mouth at bedtime. Start taking Singulair on 11/16/2019. 10/31/19   [name], MD  Multiple Vitamins-Minerals (MULTIVITAMIN ADULT PO) Take 1 tablet by mouth  daily.     [provider]  Naproxen 375 MG TBEC Take 1 tablet (375 mg total) by mouth every morning. 02/26/20   Mosie Lukes, MD  ondansetron (ZOFRAN) 4 MG tablet Take 1 tablet (4 mg total) by mouth every 8 (eight) hours as needed for nausea or vomiting. 04/14/18   Mosie Lukes, MD  pantoprazole (PROTONIX) 40 MG tablet Take 1 tablet (40 mg total) by mouth 2 (two) times daily. 02/15/18   Mosie Lukes, MD  potassium chloride SA (KLOR-CON) 20 MEQ tablet Take 1 tablet (20 mEq total) by mouth daily. 03/05/20   Mosie Lukes, MD  promethazine (PHENERGAN) 25 MG tablet Take 1 tablet (25 mg total) by mouth every 6 (six) hours as needed for nausea or vomiting. 08/10/20   Henderly, Britni A, PA-C  spironolactone (ALDACTONE) 25 MG tablet Take 1 tablet by mouth daily. 05/08/20   [provider]  sucralfate (CARAFATE) 1 g tablet Take 1 tablet (1 g total) by mouth 3 (three) times daily as needed. prn 07/30/20   Mosie Lukes, MD  sulfamethoxazole-trimethoprim (BACTRIM DS) 800-160 MG tablet Take 1 tablet by mouth 2 (two) times daily for 3 days. 08/23/20 08/26/20  Marcello Fennel, PA-C  topiramate (TOPAMAX) 50 MG tablet Take 1 tablet (50 mg total) by mouth 2 (two) times daily. 07/13/19   Mosie Lukes, MD    Allergies    Benazepril, Ondansetron hcl, Codeine, and Morphine  Review of Systems   Review of Systems  Constitutional: Negative for chills and fever.  HENT: Negative for congestion, tinnitus, trouble swallowing and voice change.   Eyes: Negative for visual disturbance.  Respiratory: Negative for cough and shortness of breath.   Cardiovascular: Negative for chest pain and palpitations.  Gastrointestinal: Negative for abdominal pain, diarrhea, nausea and vomiting.  Genitourinary: Negative for dyspareunia, dysuria, enuresis, menstrual problem, pelvic pain, vaginal bleeding and vaginal discharge.  Musculoskeletal: Negative for back pain.  Skin: Negative for rash.  Neurological:  Positive for light-headedness. Negative for dizziness.  Hematological: Does not bruise/bleed easily.    Physical Exam Updated Vital Signs BP (!) 151/93 (BP Location: Left Arm)   Pulse 88   Temp 97.9 F (36.6 C) (Oral)   Resp 18   Ht 5' 6" (1.676 m)  Wt 88.5 kg   SpO2 98%   BMI 31.49 kg/m   Physical Exam Vitals and nursing note reviewed. Exam conducted with a chaperone present.  Constitutional:      General: She is not in acute distress.    Appearance: Normal appearance. She is not ill-appearing or diaphoretic.  HENT:     Head: Normocephalic and atraumatic.     Nose: No congestion or rhinorrhea.     Mouth/Throat:     Mouth: Mucous membranes are moist.     Pharynx: Oropharynx is clear.  Eyes:     General: No visual field deficit or scleral icterus.       Right eye: No discharge.        Left eye: No discharge.     Conjunctiva/sclera: Conjunctivae normal.     Pupils: Pupils are equal, round, and reactive to light.  Cardiovascular:     Rate and Rhythm: Normal rate and regular rhythm.     Pulses: Normal pulses.     Heart sounds: No murmur heard.  No friction rub. No gallop.   Pulmonary:     Effort: Pulmonary effort is normal. No respiratory distress.     Breath sounds: No stridor. No wheezing, rhonchi or rales.  Abdominal:     General: There is no distension.     Palpations: Abdomen is soft.     Tenderness: There is no abdominal tenderness. There is no right CVA tenderness, left CVA tenderness or guarding.  Genitourinary:    General: Normal vulva.     Comments: Pelvic exam finding exterior genitalia was examined there was no lesions, rashes or discharge noted.  Vaginal canal was patent, pink, no lesions or trauma noted.  Cervix was visualized there was no lesions, discharge, or other abnormalities noted. Musculoskeletal:        General: No swelling or tenderness.  Skin:    General: Skin is warm and dry.     Findings: No rash.  Neurological:     General: No focal  deficit present.     Mental Status: She is alert and oriented to person, place, and time.     GCS: GCS eye subscore is 4. GCS verbal subscore is 5. GCS motor subscore is 6.     Cranial Nerves: Cranial nerves are intact. No cranial nerve deficit or facial asymmetry.     Sensory: Sensation is intact. No sensory deficit.     Motor: Motor function is intact. No weakness or pronator drift.     Coordination: Coordination is intact. Romberg sign negative. Finger-Nose-Finger Test and Heel to [facility] Test normal.  Psychiatric:        Mood and Affect: Mood normal.     ED Results / Procedures / Treatments   Labs (all labs ordered are listed, but only abnormal results are displayed) Labs Reviewed  WET PREP, GENITAL - Abnormal; Notable for the following components:      Result Value   WBC, Wet Prep HPF POC MANY (*)    All other components within normal limits  URINALYSIS, ROUTINE W REFLEX MICROSCOPIC - Abnormal; Notable for the following components:   APPearance CLOUDY (*)    Hgb urine dipstick TRACE (*)    Leukocytes,Ua MODERATE (*)    All other components within normal limits  URINALYSIS, MICROSCOPIC (REFLEX) - Abnormal; Notable for the following components:   Bacteria, UA MANY (*)    All other components within normal limits  BASIC METABOLIC PANEL - Abnormal; Notable for the following components:  Potassium 3.4 (*)    Glucose, Bld 106 (*)    All other components within normal limits  CBC WITH DIFFERENTIAL/PLATELET - Abnormal; Notable for the following components:   WBC 2.5 (*)    Platelets 108 (*)    Neutro Abs 1.2 (*)    All other components within normal limits  URINE CULTURE  RPR  HIV ANTIBODY (ROUTINE TESTING W REFLEX)  GC/CHLAMYDIA PROBE AMP ([place]) NOT AT [facility]  TROPONIN I (HIGH SENSITIVITY)  TROPONIN I (HIGH SENSITIVITY)    EKG EKG Interpretation  Date/Time:  Friday August 23 2020 11:02:06 EDT Ventricular Rate:  88 PR Interval:  170 QRS Duration: 164 QT  Interval:  432 QTC Calculation: 522 R Axis:   -14 Text Interpretation: Normal sinus rhythm Possible Left atrial enlargement Left bundle branch block Abnormal ECG No significant change since last tracing Confirmed by Knapp, Jon ([id number]) on 08/23/2020 11:13:34 AM   Radiology No results found.  Procedures Pelvic exam  Date/Time: 08/23/2020 4:58 PM Performed by: Faulkner, William J, PA-C Authorized by: Faulkner, William J, PA-C  Preparation: Patient was prepped and draped in the usual sterile fashion. Local anesthesia used: no  Anesthesia: Local anesthesia used: no  Sedation: Patient sedated: no  Patient tolerance: patient tolerated the procedure well with no immediate complications    (including critical care time)  Medications Ordered in ED Medications - No data to display  ED Course  I have reviewed the triage vital signs and the nursing notes.  Pertinent labs & imaging results that were available during my care of the patient were reviewed by me and considered in my medical decision making (see chart for details).    MDM Rules/Calculators/A&P                          Patient presents emerged part with chief complaint of dizziness and vaginal itchiness.  She was alert, did not appear in acute distress, vital signs reassuring.  Will order labs for further evaluation.   With chaperone present pelvic exam was performed no gross abnormalities noted, patient was nontender during exam. UA shows moderate leukocytes, white blood cells and many bacteria.  Wet prep shows many white blood cells.  CBC shows leukocytopenia, no signs of anemia.  BMP shows hypokalemia of 3.4, no signs of metabolic acidosis, hyperglycemia 106, no anion gap.  Patient had a negative delta troponin.  EKG shows left bundle branch block appears to be unchanged from previous EKGs.  No signs of ST elevation or depression noted.  I have low suspicion for systemic infection as patient is nontoxic-appearing, vital  signs reassuring, no leukocytosis noted on CBC.  Low suspicion for acute abdomen requiring surgical intervention as patient denies abdominal pain, nausea or vomiting, tolerating p.o., no acute abdomen noted on exam.  Low suspicion for ACS as patient denies chest pain, shortness of breath, no hypoperfusion fluid overload on exam, EKG does not show signs of ischemia patient had a negative delta troponin.  low suspicion for PE as patient denies pleuritic chest pain, shortness of breath, vital signs reassuring.  Low suspicion for CVA as patient denies headache, change in vision, paresthesias or weakness of lower extremities, neuro exam was benign.  I suspect patient is suffering from continued UTI.  Will start patient on Bactrim and have her follow-up with her PCP for further evaluation.  Vital signs remained stable, no indication for hospital admission.  Patient was given at home care as well strict   return precautions.  Verbalized that she understood agreed to said plan. Final Clinical Impression(s) / ED Diagnoses Final diagnoses:  Acute cystitis without hematuria    Rx / DC Orders ED Discharge Orders         Ordered    sulfamethoxazole-trimethoprim (BACTRIM DS) 800-160 MG tablet  2 times daily        08/23/20 1823           Faulkner, William J, PA-C 08/23/20 1932    Knapp, Jon, MD 08/24/20 0701  

## 2020-08-23 NOTE — ED Triage Notes (Addendum)
Recent UTI tx with antibiotics. She continues to have symptoms of a UTI. C.o dizziness.

## 2020-08-24 LAB — RPR: RPR Ser Ql: NONREACTIVE

## 2020-08-24 LAB — URINE CULTURE: Culture: <10000 — AB

## 2020-08-26 ENCOUNTER — Inpatient Hospital Stay: Payer: Medicare Other | Attending: Hematology & Oncology

## 2020-08-26 ENCOUNTER — Inpatient Hospital Stay (HOSPITAL_BASED_OUTPATIENT_CLINIC_OR_DEPARTMENT_OTHER): Payer: Medicare Other | Admitting: Hematology & Oncology

## 2020-08-26 ENCOUNTER — Other Ambulatory Visit: Payer: Self-pay

## 2020-08-26 ENCOUNTER — Telehealth: Payer: Self-pay | Admitting: Hematology & Oncology

## 2020-08-26 VITALS — BP 160/81 | HR 85 | Temp 98.0°F | Resp 18 | Wt 195.0 lb

## 2020-08-26 DIAGNOSIS — C9002 Multiple myeloma in relapse: Secondary | ICD-10-CM

## 2020-08-26 DIAGNOSIS — C9000 Multiple myeloma not having achieved remission: Secondary | ICD-10-CM | POA: Diagnosis not present

## 2020-08-26 DIAGNOSIS — R519 Headache, unspecified: Secondary | ICD-10-CM | POA: Insufficient documentation

## 2020-08-26 DIAGNOSIS — Z5112 Encounter for antineoplastic immunotherapy: Secondary | ICD-10-CM | POA: Diagnosis not present

## 2020-08-26 LAB — CBC WITH DIFFERENTIAL (CANCER CENTER ONLY)
Abs Immature Granulocytes: 0 10*3/uL (ref 0.00–0.07)
Basophils Absolute: 0 10*3/uL (ref 0.0–0.1)
Basophils Relative: 1 %
Eosinophils Absolute: 0.1 10*3/uL (ref 0.0–0.5)
Eosinophils Relative: 3 %
HCT: 37.6 % (ref 36.0–46.0)
Hemoglobin: 12.1 g/dL (ref 12.0–15.0)
Immature Granulocytes: 0 %
Lymphocytes Relative: 33 %
Lymphs Abs: 0.9 10*3/uL (ref 0.7–4.0)
MCH: 29.5 pg (ref 26.0–34.0)
MCHC: 32.2 g/dL (ref 30.0–36.0)
MCV: 91.7 fL (ref 80.0–100.0)
Monocytes Absolute: 0.3 10*3/uL (ref 0.1–1.0)
Monocytes Relative: 10 %
Neutro Abs: 1.5 10*3/uL — ABNORMAL LOW (ref 1.7–7.7)
Neutrophils Relative %: 53 %
Platelet Count: 101 10*3/uL — ABNORMAL LOW (ref 150–400)
RBC: 4.1 MIL/uL (ref 3.87–5.11)
RDW: 14.6 % (ref 11.5–15.5)
WBC Count: 2.8 10*3/uL — ABNORMAL LOW (ref 4.0–10.5)
nRBC: 0 % (ref 0.0–0.2)

## 2020-08-26 LAB — CMP (CANCER CENTER ONLY)
ALT: 15 U/L (ref 0–44)
AST: 31 U/L (ref 15–41)
Albumin: 3.9 g/dL (ref 3.5–5.0)
Alkaline Phosphatase: 53 U/L (ref 38–126)
Anion gap: 7 (ref 5–15)
BUN: 18 mg/dL (ref 8–23)
CO2: 26 mmol/L (ref 22–32)
Calcium: 9.5 mg/dL (ref 8.9–10.3)
Chloride: 108 mmol/L (ref 98–111)
Creatinine: 0.77 mg/dL (ref 0.44–1.00)
GFR, Estimated: >60 mL/min (ref >60)
Glucose, Bld: 104 mg/dL — ABNORMAL HIGH (ref 70–99)
Potassium: 3.8 mmol/L (ref 3.5–5.1)
Sodium: 141 mmol/L (ref 135–145)
Total Bilirubin: 0.4 mg/dL (ref 0.3–1.2)
Total Protein: 8.7 g/dL — ABNORMAL HIGH (ref 6.5–8.1)

## 2020-08-26 LAB — FERRITIN: Ferritin: 2483 ng/mL — ABNORMAL HIGH (ref 11–307)

## 2020-08-26 LAB — IRON AND TIBC
Iron: 124 ug/dL (ref 41–142)
Saturation Ratios: 44 % (ref 21–57)
TIBC: 284 ug/dL (ref 236–444)
UIBC: 160 ug/dL (ref 120–384)

## 2020-08-26 LAB — GC/CHLAMYDIA PROBE AMP (~~LOC~~) NOT AT ARMC
Chlamydia: NEGATIVE
Comment: NEGATIVE
Comment: NORMAL
Neisseria Gonorrhea: NEGATIVE

## 2020-08-26 NOTE — Telephone Encounter (Signed)
Appointments scheduled calendar printed & mailed per 10/18 los 

## 2020-08-26 NOTE — Progress Notes (Signed)
Hematology and Oncology Follow Up Visit  Tami [name] [id number] [birth year] 64 y.o. 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 is here today for follow-up.  It looks like the myeloma is becoming more active.  We saw her a few weeks ago, the M spike was 1.4 g/dL.  The IgG level was up to 2200 mg/dL.  Her kappa light chain was 6.9 mg/dL.  We will go ahead and try Faspro again.  We will stop this because we thought this is causing her headaches.  However, it seems as if her headaches are very different etiology.  She is seen an ophthalmologist.  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  I am not sure exactly what this is.  She is still having some headaches.  Her Topamax dose was increased.  I think that it be worthwhile trying the Faspro again.  I know that she has had problems with other medications before.  She apparently had the COVID back on October 1.  She really had no symptoms.  She was tested for a UTI and apparently she had the Covid test at the same time.  She was given an infusion of a monoclonal antibody if I am not mistaken.  She again had no symptoms.  She feels well.  She has had no problems with bowels or bladder.  She is on a probiotic for occasional diarrhea.  There is no cough or shortness of breath.  She has no leg swelling.  There is no bleeding.    Overall, her performance status is ECOG 1.     Medications:  Allergies as of 08/26/2020      Reactions   Benazepril Anaphylaxis, Swelling, Hives   angioedema Throat and lip swelling   Ondansetron Hcl Hives   Redness and hives post IV admin on 07/05/17   Codeine Nausea And  Vomiting   Morphine Hives   Redness and hives noted post IV admin on 07/05/17      Medication List       Accurate as of August 26, 2020  9:57 AM. If you have any questions, ask your nurse or doctor.        STOP taking these medications   Naproxen 375 MG Tbec Stopped by: [name], MD   potassium chloride SA 20 MEQ tablet Commonly known as: KLOR-CON Stopped by: [name], MD     TAKE these medications   acetaminophen 500 MG tablet Commonly known as: TYLENOL Take 500 mg by mouth every 6 (six) hours as needed.   albuterol 108 (90 Base) MCG/ACT inhaler Commonly known as: VENTOLIN HFA Inhale 2 puffs into the lungs every 6 (six) hours as needed for wheezing or shortness of breath.   aspirin EC 81 MG tablet Take 1 tablet (81 mg total) by mouth daily.   carvedilol 25 MG tablet Commonly known as: COREG Take 1 tablet (25 mg total) by mouth 2 (two) times daily with a meal.   cyclobenzaprine 10 MG tablet Commonly known as: FLEXERIL Take 1 tablet (10 mg total) by mouth at bedtime.   fluticasone 50 MCG/ACT nasal spray Commonly known as: FLONASE Place 2 sprays  into both nostrils daily.   furosemide 20 MG tablet Commonly known as: LASIX Take 1 tablet by mouth as directed.   guaiFENesin 600 MG 12 hr tablet Commonly known as: MUCINEX Take 600 mg by mouth 2 (two) times daily.   losartan 25 MG tablet Commonly known as: COZAAR Take 25 mg by mouth. Take two tablets, total of 50 mg daily   magnesium oxide 400 MG tablet Commonly known as: MAG-OX Take 1 tablet (400 mg total) by mouth 2 (two) times daily.   meclizine 12.5 MG tablet Commonly known as: ANTIVERT Take 12.5 mg by mouth 3 (three) times daily.   melatonin 3 MG Tabs tablet Take 3 mg by mouth at bedtime.   montelukast 10 MG tablet Commonly known as: Singulair Take 1 tablet (10 mg total) by mouth at bedtime. Start taking Singulair on 11/16/2019.   MULTIVITAMIN ADULT PO Take 1 tablet by mouth daily.    NON FORMULARY Take 1 capsule by mouth daily. Colon Health Daily Probiotic 4- in - one   ondansetron 4 MG tablet Commonly known as: Zofran Take 1 tablet (4 mg total) by mouth every 8 (eight) hours as needed for nausea or vomiting.   pantoprazole 40 MG tablet Commonly known as: PROTONIX Take 1 tablet (40 mg total) by mouth 2 (two) times daily.   Premarin vaginal cream Generic drug: conjugated estrogens Place 1 Applicatorful vaginally as needed.   promethazine 25 MG tablet Commonly known as: PHENERGAN Take 1 tablet (25 mg total) by mouth every 6 (six) hours as needed for nausea or vomiting.   spironolactone 25 MG tablet Commonly known as: ALDACTONE Take 1 tablet by mouth daily.   sucralfate 1 g tablet Commonly known as: Carafate Take 1 tablet (1 g total) by mouth 3 (three) times daily as needed. prn   sulfamethoxazole-trimethoprim 800-160 MG tablet Commonly known as: BACTRIM DS Take 1 tablet by mouth 2 (two) times daily for 3 days.   topiramate 50 MG tablet Commonly known as: Topamax Take 1 tablet (50 mg total) by mouth 2 (two) times daily.   VITAMIN D PO Take 1,000 Units by mouth daily.   Vitamin D-3 25 MCG (1000 UT) Caps Take 1 capsule (1,000 Units total) by mouth daily.       Allergies:  Allergies  Allergen Reactions  . Benazepril Anaphylaxis, Swelling and Hives    angioedema Throat and lip swelling  . Ondansetron Hcl Hives    Redness and hives post IV admin on 07/05/17  . Codeine Nausea And Vomiting  . Morphine Hives    Redness and hives noted post IV admin on 07/05/17    Past Medical History, Surgical history, Social history, and Family History were reviewed and updated.  Review of Systems: Review of Systems  Constitutional: Negative.   HENT: Negative.   Eyes: Negative.   Respiratory: Negative.   Cardiovascular: Negative.   Gastrointestinal: Negative.   Genitourinary: Negative.   Musculoskeletal: Negative.   Skin: Negative.   Neurological:  Negative.   Endo/Heme/Allergies: Negative.   Psychiatric/Behavioral: Negative.       Physical Exam:  weight is 195 lb (88.5 kg). Her oral temperature is 98 F (36.7 C). Her blood pressure is 160/81 (abnormal) and her pulse is 85. Her respiration is 18 and oxygen saturation is 99%.   Wt Readings from Last 3 Encounters:  08/26/20 195 lb (88.5 kg)  08/23/20 195 lb 1.7 oz (88.5 kg)  08/10/20 196 lb (88.9 kg)    Physical Exam Vitals reviewed.  HENT:     Head: Normocephalic and atraumatic.  Eyes:     Pupils: Pupils are equal, round, and reactive to light.  Cardiovascular:     Rate and Rhythm: Normal rate and regular rhythm.     Heart sounds: Normal heart sounds.  Pulmonary:     Effort: Pulmonary effort is normal.     Breath sounds: Normal breath sounds.  Abdominal:     General: Bowel sounds are normal.     Palpations: Abdomen is soft.  Musculoskeletal:        General: No tenderness or deformity. Normal range of motion.     Cervical back: Normal range of motion.  Lymphadenopathy:     Cervical: No cervical adenopathy.  Skin:    General: Skin is warm and dry.     Findings: No erythema or rash.  Neurological:     Mental Status: She is alert and oriented to person, place, and time.  Psychiatric:        Behavior: Behavior normal.        Thought Content: Thought content normal.        Judgment: Judgment normal.      Lab Results  Component Value Date   WBC 2.8 (L) 08/26/2020   HGB 12.1 08/26/2020   HCT 37.6 08/26/2020   MCV 91.7 08/26/2020   PLT 101 (L) 08/26/2020   Lab Results  Component Value Date   FERRITIN 81 04/22/2020   IRON 67 04/22/2020   TIBC 369 04/22/2020   UIBC 302 04/22/2020   IRONPCTSAT 18 (L) 04/22/2020   Lab Results  Component Value Date   RETICCTPCT 0.8 07/16/2016   RBC 4.10 08/26/2020   RETICCTABS 32,640 07/16/2016   Lab Results  Component Value Date   KPAFRELGTCHN 62.0 (H) 07/26/2020   LAMBDASER 3.9 (L) 07/26/2020   KAPLAMBRATIO 15.90  (H) 07/26/2020   Lab Results  Component Value Date   IGGSERUM 2,186 (H) 07/26/2020   IGGSERUM 2,115 (H) 07/26/2020   IGA 119 07/26/2020   IGA 109 07/26/2020   IGMSERUM 82 07/26/2020   IGMSERUM 80 07/26/2020   Lab Results  Component Value Date   TOTALPROTELP 7.6 07/26/2020   ALBUMINELP 3.5 07/26/2020   A1GS 0.2 07/26/2020   A2GS 0.9 07/26/2020   BETS 1.0 07/26/2020   BETA2SER 0.5 07/16/2016   GAMS 2.0 (H) 07/26/2020   MSPIKE 1.4 (H) 07/26/2020   SPEI Comment 12/07/2019     Chemistry      Component Value Date/Time   NA 141 08/26/2020 0902   NA 143 11/08/2017 1127   NA 140 12/31/2016 1000   K 3.8 08/26/2020 0902   K 4.0 11/08/2017 1127   K 3.6 12/31/2016 1000   CL 108 08/26/2020 0902   CL 107 11/08/2017 1127   CO2 26 08/26/2020 0902   CO2 25 11/08/2017 1127   CO2 24 12/31/2016 1000   BUN 18 08/26/2020 0902   BUN 19 11/08/2017 1127   BUN 22.3 12/31/2016 1000   CREATININE 0.77 08/26/2020 0902   CREATININE 0.9 11/08/2017 1127   CREATININE 0.8 12/31/2016 1000      Component Value Date/Time   CALCIUM 9.5 08/26/2020 0902   CALCIUM 9.9 11/08/2017 1127   CALCIUM 9.5 12/31/2016 1000   ALKPHOS 53 08/26/2020 0902   ALKPHOS 44 11/08/2017 1127   ALKPHOS 51 12/31/2016 1000   AST 31 08/26/2020 0902   AST 28 12/31/2016 1000   ALT 15 08/26/2020 0902   ALT 36 11/08/2017 1127   ALT 16  12/31/2016 1000   BILITOT 0.4 08/26/2020 0902   BILITOT 0.46 12/31/2016 1000       Impression and Plan: Ms. Wachtel is a very pleasant 64 yo African American female with a previous IgG kappa MGUS with progression to myeloma.  Again, her myeloma is not that high with respect to his levels.  However, with Ms. Hefter, the levels can go up pretty quickly.  We will go ahead and try the Faspro again.  I think this is reasonable.  She was responding to it.  Again hopefully the headaches will not be a problem for her.  We will get her set up and get her started in a couple weeks.  I will plan to see  her back probably in about 4 5 weeks.     [name], MD 10/18/20219:57 AM

## 2020-08-27 ENCOUNTER — Ambulatory Visit: Payer: Medicare Other | Admitting: Medical

## 2020-08-27 DIAGNOSIS — R300 Dysuria: Secondary | ICD-10-CM | POA: Diagnosis not present

## 2020-08-27 DIAGNOSIS — R339 Retention of urine, unspecified: Secondary | ICD-10-CM | POA: Diagnosis not present

## 2020-08-27 DIAGNOSIS — Z09 Encounter for follow-up examination after completed treatment for conditions other than malignant neoplasm: Secondary | ICD-10-CM | POA: Diagnosis not present

## 2020-08-27 DIAGNOSIS — N281 Cyst of kidney, acquired: Secondary | ICD-10-CM | POA: Diagnosis not present

## 2020-08-27 LAB — IGG, IGA, IGM
IgA: 130 mg/dL (ref 87–352)
IgG (Immunoglobin G), Serum: 2623 mg/dL — ABNORMAL HIGH (ref 586–1602)
IgM (Immunoglobulin M), Srm: 111 mg/dL (ref 26–217)

## 2020-08-27 LAB — KAPPA/LAMBDA LIGHT CHAINS
Kappa free light chain: 76.6 mg/L — ABNORMAL HIGH (ref 3.3–19.4)
Kappa, lambda light chain ratio: 15.96 — ABNORMAL HIGH (ref 0.26–1.65)
Lambda free light chains: 4.8 mg/L — ABNORMAL LOW (ref 5.7–26.3)

## 2020-08-29 DIAGNOSIS — E611 Iron deficiency: Secondary | ICD-10-CM | POA: Diagnosis not present

## 2020-08-29 DIAGNOSIS — I5042 Chronic combined systolic (congestive) and diastolic (congestive) heart failure: Secondary | ICD-10-CM | POA: Diagnosis not present

## 2020-08-29 DIAGNOSIS — Z95810 Presence of automatic (implantable) cardiac defibrillator: Secondary | ICD-10-CM | POA: Diagnosis not present

## 2020-08-29 DIAGNOSIS — E559 Vitamin D deficiency, unspecified: Secondary | ICD-10-CM | POA: Diagnosis not present

## 2020-08-29 DIAGNOSIS — C9001 Multiple myeloma in remission: Secondary | ICD-10-CM | POA: Diagnosis not present

## 2020-08-29 DIAGNOSIS — Z8679 Personal history of other diseases of the circulatory system: Secondary | ICD-10-CM | POA: Diagnosis not present

## 2020-08-29 DIAGNOSIS — G4733 Obstructive sleep apnea (adult) (pediatric): Secondary | ICD-10-CM | POA: Diagnosis not present

## 2020-08-29 DIAGNOSIS — I110 Hypertensive heart disease with heart failure: Secondary | ICD-10-CM | POA: Diagnosis not present

## 2020-08-29 LAB — PROTEIN ELECTROPHORESIS, SERUM, WITH REFLEX
A/G Ratio: 0.8 (ref 0.7–1.7)
Albumin ELP: 3.5 g/dL (ref 2.9–4.4)
Alpha-1-Globulin: 0.2 g/dL (ref 0.0–0.4)
Alpha-2-Globulin: 1 g/dL (ref 0.4–1.0)
Beta Globulin: 1.1 g/dL (ref 0.7–1.3)
Gamma Globulin: 2.3 g/dL — ABNORMAL HIGH (ref 0.4–1.8)
Globulin, Total: 4.6 g/dL — ABNORMAL HIGH (ref 2.2–3.9)
M-Spike, %: 1.6 g/dL — ABNORMAL HIGH
SPEP Interpretation: 0
Total Protein ELP: 8.1 g/dL (ref 6.0–8.5)

## 2020-08-29 LAB — IMMUNOFIXATION REFLEX, SERUM
IgA: 127 mg/dL (ref 87–352)
IgG (Immunoglobin G), Serum: 2720 mg/dL — ABNORMAL HIGH (ref 586–1602)
IgM (Immunoglobulin M), Srm: 109 mg/dL (ref 26–217)

## 2020-09-04 ENCOUNTER — Other Ambulatory Visit: Payer: Self-pay | Admitting: *Deleted

## 2020-09-04 ENCOUNTER — Inpatient Hospital Stay: Payer: Medicare Other

## 2020-09-04 ENCOUNTER — Other Ambulatory Visit: Payer: Self-pay

## 2020-09-04 VITALS — BP 140/78 | HR 71 | Temp 97.9°F | Resp 17

## 2020-09-04 DIAGNOSIS — C9000 Multiple myeloma not having achieved remission: Secondary | ICD-10-CM

## 2020-09-04 DIAGNOSIS — C9002 Multiple myeloma in relapse: Secondary | ICD-10-CM

## 2020-09-04 DIAGNOSIS — Z5112 Encounter for antineoplastic immunotherapy: Secondary | ICD-10-CM | POA: Diagnosis not present

## 2020-09-04 DIAGNOSIS — R519 Headache, unspecified: Secondary | ICD-10-CM | POA: Diagnosis not present

## 2020-09-04 LAB — CBC WITH DIFFERENTIAL (CANCER CENTER ONLY)
Abs Immature Granulocytes: 0 10*3/uL (ref 0.00–0.07)
Basophils Absolute: 0 10*3/uL (ref 0.0–0.1)
Basophils Relative: 1 %
Eosinophils Absolute: 0.1 10*3/uL (ref 0.0–0.5)
Eosinophils Relative: 3 %
HCT: 38.1 % (ref 36.0–46.0)
Hemoglobin: 12 g/dL (ref 12.0–15.0)
Immature Granulocytes: 0 %
Lymphocytes Relative: 30 %
Lymphs Abs: 0.8 10*3/uL (ref 0.7–4.0)
MCH: 29.4 pg (ref 26.0–34.0)
MCHC: 31.5 g/dL (ref 30.0–36.0)
MCV: 93.4 fL (ref 80.0–100.0)
Monocytes Absolute: 0.2 10*3/uL (ref 0.1–1.0)
Monocytes Relative: 7 %
Neutro Abs: 1.6 10*3/uL — ABNORMAL LOW (ref 1.7–7.7)
Neutrophils Relative %: 59 %
Platelet Count: 121 10*3/uL — ABNORMAL LOW (ref 150–400)
RBC: 4.08 MIL/uL (ref 3.87–5.11)
RDW: 14.6 % (ref 11.5–15.5)
WBC Count: 2.6 10*3/uL — ABNORMAL LOW (ref 4.0–10.5)
nRBC: 0 % (ref 0.0–0.2)

## 2020-09-04 LAB — COMPREHENSIVE METABOLIC PANEL
ALT: 36 U/L (ref 0–44)
AST: 34 U/L (ref 15–41)
Albumin: 3.8 g/dL (ref 3.5–5.0)
Alkaline Phosphatase: 53 U/L (ref 38–126)
Anion gap: 6 (ref 5–15)
BUN: 16 mg/dL (ref 8–23)
CO2: 29 mmol/L (ref 22–32)
Calcium: 9.7 mg/dL (ref 8.9–10.3)
Chloride: 105 mmol/L (ref 98–111)
Creatinine, Ser: 0.69 mg/dL (ref 0.44–1.00)
GFR, Estimated: >60 mL/min (ref >60)
Glucose, Bld: 122 mg/dL — ABNORMAL HIGH (ref 70–99)
Potassium: 3.8 mmol/L (ref 3.5–5.1)
Sodium: 140 mmol/L (ref 135–145)
Total Bilirubin: 0.4 mg/dL (ref 0.3–1.2)
Total Protein: 8.5 g/dL — ABNORMAL HIGH (ref 6.5–8.1)

## 2020-09-04 LAB — TYPE AND SCREEN
ABO/RH(D): A POS
Antibody Screen: NEGATIVE

## 2020-09-04 MED ORDER — MONTELUKAST SODIUM 10 MG PO TABS
10.0000 mg | ORAL_TABLET | Freq: Once | ORAL | Status: AC
  Administered 2020-09-04: 10 mg via ORAL
  Filled 2020-09-04: qty 1

## 2020-09-04 MED ORDER — DEXAMETHASONE 4 MG PO TABS
20.0000 mg | ORAL_TABLET | Freq: Once | ORAL | Status: AC
  Administered 2020-09-04: 20 mg via ORAL

## 2020-09-04 MED ORDER — ACETAMINOPHEN 325 MG PO TABS
ORAL_TABLET | ORAL | Status: AC
  Filled 2020-09-04: qty 2

## 2020-09-04 MED ORDER — DIPHENHYDRAMINE HCL 25 MG PO CAPS
ORAL_CAPSULE | ORAL | Status: AC
  Filled 2020-09-04: qty 2

## 2020-09-04 MED ORDER — DARATUMUMAB-HYALURONIDASE-FIHJ 1800-30000 MG-UT/15ML ~~LOC~~ SOLN
1800.0000 mg | Freq: Once | SUBCUTANEOUS | Status: AC
  Administered 2020-09-04: 1800 mg via SUBCUTANEOUS
  Filled 2020-09-04: qty 15

## 2020-09-04 MED ORDER — DEXAMETHASONE 4 MG PO TABS
ORAL_TABLET | ORAL | Status: AC
  Filled 2020-09-04: qty 5

## 2020-09-04 MED ORDER — PROCHLORPERAZINE MALEATE 10 MG PO TABS: 10.0000 mg | ORAL_TABLET | Freq: Four times a day (QID) | ORAL | 1 refills | Status: DC | PRN

## 2020-09-04 MED ORDER — ACETAMINOPHEN 325 MG PO TABS
650.0000 mg | ORAL_TABLET | Freq: Once | ORAL | Status: AC
  Administered 2020-09-04: 650 mg via ORAL

## 2020-09-04 MED ORDER — DEXAMETHASONE 4 MG PO TABS: 4.0000 mg | ORAL_TABLET | Freq: Every day | ORAL | 4 refills | Status: DC

## 2020-09-04 MED ORDER — ONDANSETRON HCL 8 MG PO TABS: 8.0000 mg | ORAL_TABLET | Freq: Two times a day (BID) | ORAL | 1 refills | Status: DC | PRN

## 2020-09-04 MED ORDER — ACYCLOVIR 400 MG PO TABS: 400.0000 mg | ORAL_TABLET | Freq: Two times a day (BID) | ORAL | 11 refills | Status: DC

## 2020-09-04 MED ORDER — DIPHENHYDRAMINE HCL 25 MG PO CAPS
50.0000 mg | ORAL_CAPSULE | Freq: Once | ORAL | Status: AC
  Administered 2020-09-04: 50 mg via ORAL

## 2020-09-04 NOTE — Progress Notes (Signed)
ty

## 2020-09-04 NOTE — Progress Notes (Signed)
Pt discharged in no apparent distress. Pt left ambulatory without assistance. Pt aware of discharge instructions and verbalized understanding and had no further questions.  

## 2020-09-04 NOTE — Patient Instructions (Signed)
Daratumumab injection What is this medicine? DARATUMUMAB (dar a toom ue mab) is a monoclonal antibody. It is used to treat multiple myeloma. This medicine may be used for other purposes; ask your health care provider or pharmacist if you have questions. COMMON BRAND NAME(S): DARZALEX What should I tell my health care provider before I take this medicine? They need to know if you have any of these conditions:  infection (especially a virus infection such as chickenpox, herpes, or hepatitis B virus)  lung or breathing disease  an unusual or allergic reaction to daratumumab, other medicines, foods, dyes, or preservatives  pregnant or trying to get pregnant  breast-feeding How should I use this medicine? This medicine is for infusion into a vein. It is given by a health care professional in a hospital or clinic setting. Talk to your pediatrician regarding the use of this medicine in children. Special care may be needed. Overdosage: If you think you have taken too much of this medicine contact a poison control center or emergency room at once. NOTE: This medicine is only for you. Do not share this medicine with others. What if I miss a dose? Keep appointments for follow-up doses as directed. It is important not to miss your dose. Call your doctor or health care professional if you are unable to keep an appointment. What may interact with this medicine? Interactions have not been studied. This list may not describe all possible interactions. Give your health care provider a list of all the medicines, herbs, non-prescription drugs, or dietary supplements you use. Also tell them if you smoke, drink alcohol, or use illegal drugs. Some items may interact with your medicine. What should I watch for while using this medicine? This drug may make you feel generally unwell. Report any side effects. Continue your course of treatment even though you feel ill unless your doctor tells you to stop. This  medicine can cause serious allergic reactions. To reduce your risk you may need to take medicine before treatment with this medicine. Take your medicine as directed. This medicine can affect the results of blood tests to match your blood type. These changes can last for up to 6 months after the final dose. Your healthcare provider will do blood tests to match your blood type before you start treatment. Tell all of your healthcare providers that you are being treated with this medicine before receiving a blood transfusion. This medicine can affect the results of some tests used to determine treatment response; extra tests may be needed to evaluate response. Do not become pregnant while taking this medicine or for 3 months after stopping it. Women should inform their doctor if they wish to become pregnant or think they might be pregnant. There is a potential for serious side effects to an unborn child. Talk to your health care professional or pharmacist for more information. What side effects may I notice from receiving this medicine? Side effects that you should report to your doctor or health care professional as soon as possible:  allergic reactions like skin rash, itching or hives, swelling of the face, lips, or tongue  breathing problems  chills  cough  dizziness  feeling faint or lightheaded  headache  low blood counts - this medicine may decrease the number of white blood cells, red blood cells and platelets. You may be at increased risk for infections and bleeding.  nausea, vomiting  shortness of breath  signs of decreased platelets or bleeding - bruising, pinpoint red spots on   the skin, black, tarry stools, blood in the urine  signs of decreased red blood cells - unusually weak or tired, feeling faint or lightheaded, falls  signs of infection - fever or chills, cough, sore throat, pain or difficulty passing urine  signs and symptoms of liver injury like dark yellow or brown  urine; general ill feeling or flu-like symptoms; light-colored stools; loss of appetite; right upper belly pain; unusually weak or tired; yellowing of the eyes or skin Side effects that usually do not require medical attention (report to your doctor or health care professional if they continue or are bothersome):  back pain  constipation  diarrhea  joint pain  muscle cramps  pain, tingling, numbness in the hands or feet  swelling of the ankles, feet, hands  tiredness  trouble sleeping This list may not describe all possible side effects. Call your doctor for medical advice about side effects. You may report side effects to FDA at 1-800-FDA-1088. Where should I keep my medicine? This drug is given in a hospital or clinic and will not be stored at home. NOTE: This sheet is a summary. It may not cover all possible information. If you have questions about this medicine, talk to your doctor, pharmacist, or health care provider.  2020 [employer]/Gold Standard (2019-07-04 18:10:54)  

## 2020-09-09 DIAGNOSIS — R110 Nausea: Secondary | ICD-10-CM | POA: Diagnosis not present

## 2020-09-09 DIAGNOSIS — G43809 Other migraine, not intractable, without status migrainosus: Secondary | ICD-10-CM | POA: Diagnosis not present

## 2020-09-09 DIAGNOSIS — R519 Headache, unspecified: Secondary | ICD-10-CM | POA: Diagnosis not present

## 2020-09-09 DIAGNOSIS — R7302 Impaired glucose tolerance (oral): Secondary | ICD-10-CM | POA: Diagnosis not present

## 2020-09-09 DIAGNOSIS — E559 Vitamin D deficiency, unspecified: Secondary | ICD-10-CM | POA: Diagnosis not present

## 2020-09-09 DIAGNOSIS — I10 Essential (primary) hypertension: Secondary | ICD-10-CM | POA: Diagnosis not present

## 2020-09-10 ENCOUNTER — Other Ambulatory Visit: Payer: Self-pay | Admitting: *Deleted

## 2020-09-10 DIAGNOSIS — C9002 Multiple myeloma in relapse: Secondary | ICD-10-CM

## 2020-09-11 ENCOUNTER — Inpatient Hospital Stay: Payer: Medicare Other

## 2020-09-11 ENCOUNTER — Other Ambulatory Visit: Payer: Self-pay | Admitting: Pharmacist

## 2020-09-11 ENCOUNTER — Other Ambulatory Visit: Payer: Self-pay

## 2020-09-11 ENCOUNTER — Inpatient Hospital Stay: Payer: Medicare Other | Attending: Hematology & Oncology

## 2020-09-11 VITALS — BP 131/80 | HR 93 | Temp 97.9°F | Resp 17

## 2020-09-11 DIAGNOSIS — C9002 Multiple myeloma in relapse: Secondary | ICD-10-CM

## 2020-09-11 DIAGNOSIS — C9000 Multiple myeloma not having achieved remission: Secondary | ICD-10-CM

## 2020-09-11 DIAGNOSIS — Z5112 Encounter for antineoplastic immunotherapy: Secondary | ICD-10-CM | POA: Insufficient documentation

## 2020-09-11 DIAGNOSIS — J40 Bronchitis, not specified as acute or chronic: Secondary | ICD-10-CM | POA: Diagnosis not present

## 2020-09-11 LAB — CBC WITH DIFFERENTIAL (CANCER CENTER ONLY)
Abs Immature Granulocytes: 0.01 10*3/uL (ref 0.00–0.07)
Basophils Absolute: 0 10*3/uL (ref 0.0–0.1)
Basophils Relative: 0 %
Eosinophils Absolute: 0.1 10*3/uL (ref 0.0–0.5)
Eosinophils Relative: 1 %
HCT: 39.4 % (ref 36.0–46.0)
Hemoglobin: 12.4 g/dL (ref 12.0–15.0)
Immature Granulocytes: 0 %
Lymphocytes Relative: 25 %
Lymphs Abs: 1.3 10*3/uL (ref 0.7–4.0)
MCH: 29.9 pg (ref 26.0–34.0)
MCHC: 31.5 g/dL (ref 30.0–36.0)
MCV: 94.9 fL (ref 80.0–100.0)
Monocytes Absolute: 0.4 10*3/uL (ref 0.1–1.0)
Monocytes Relative: 8 %
Neutro Abs: 3.4 10*3/uL (ref 1.7–7.7)
Neutrophils Relative %: 66 %
Platelet Count: 117 10*3/uL — ABNORMAL LOW (ref 150–400)
RBC: 4.15 MIL/uL (ref 3.87–5.11)
RDW: 14.9 % (ref 11.5–15.5)
WBC Count: 5.1 10*3/uL (ref 4.0–10.5)
nRBC: 0 % (ref 0.0–0.2)

## 2020-09-11 LAB — COMPREHENSIVE METABOLIC PANEL
ALT: 20 U/L (ref 0–44)
AST: 16 U/L (ref 15–41)
Albumin: 3.8 g/dL (ref 3.5–5.0)
Alkaline Phosphatase: 61 U/L (ref 38–126)
Anion gap: 7 (ref 5–15)
BUN: 22 mg/dL (ref 8–23)
CO2: 29 mmol/L (ref 22–32)
Calcium: 9.6 mg/dL (ref 8.9–10.3)
Chloride: 108 mmol/L (ref 98–111)
Creatinine, Ser: 0.71 mg/dL (ref 0.44–1.00)
GFR, Estimated: >60 mL/min (ref >60)
Glucose, Bld: 102 mg/dL — ABNORMAL HIGH (ref 70–99)
Potassium: 4 mmol/L (ref 3.5–5.1)
Sodium: 144 mmol/L (ref 135–145)
Total Bilirubin: 0.4 mg/dL (ref 0.3–1.2)
Total Protein: 8.3 g/dL — ABNORMAL HIGH (ref 6.5–8.1)

## 2020-09-11 MED ORDER — MONTELUKAST SODIUM 10 MG PO TABS
10.0000 mg | ORAL_TABLET | Freq: Once | ORAL | Status: AC
  Administered 2020-09-11: 10 mg via ORAL
  Filled 2020-09-11: qty 1

## 2020-09-11 MED ORDER — DEXAMETHASONE 4 MG PO TABS
20.0000 mg | ORAL_TABLET | Freq: Once | ORAL | Status: AC
  Administered 2020-09-11: 20 mg via ORAL

## 2020-09-11 MED ORDER — ACETAMINOPHEN 325 MG PO TABS
ORAL_TABLET | ORAL | Status: AC
  Filled 2020-09-11: qty 2

## 2020-09-11 MED ORDER — ACETAMINOPHEN 325 MG PO TABS
650.0000 mg | ORAL_TABLET | Freq: Once | ORAL | Status: AC
  Administered 2020-09-11: 650 mg via ORAL

## 2020-09-11 MED ORDER — DIPHENHYDRAMINE HCL 25 MG PO CAPS
ORAL_CAPSULE | ORAL | Status: AC
  Filled 2020-09-11: qty 2

## 2020-09-11 MED ORDER — DIPHENHYDRAMINE HCL 25 MG PO CAPS
50.0000 mg | ORAL_CAPSULE | Freq: Once | ORAL | Status: AC
  Administered 2020-09-11: 50 mg via ORAL

## 2020-09-11 MED ORDER — DEXAMETHASONE 4 MG PO TABS
ORAL_TABLET | ORAL | Status: AC
  Filled 2020-09-11: qty 5

## 2020-09-11 MED ORDER — DARATUMUMAB-HYALURONIDASE-FIHJ 1800-30000 MG-UT/15ML ~~LOC~~ SOLN
1800.0000 mg | Freq: Once | SUBCUTANEOUS | Status: AC
  Administered 2020-09-11: 1800 mg via SUBCUTANEOUS
  Filled 2020-09-11: qty 15

## 2020-09-11 NOTE — Progress Notes (Signed)
Pt discharged in no apparent distress. Pt left ambulatory without assistance. Pt aware of discharge instructions and verbalized understanding and had no further questions.  

## 2020-09-11 NOTE — Patient Instructions (Signed)
Dexamethasone tablets What is this medicine? DEXAMETHASONE (dex a METH a sone) is a corticosteroid. It is commonly used to treat inflammation of the skin, joints, lungs, and other organs. Common conditions treated include asthma, allergies, and arthritis. It is also used for other conditions, such as blood disorders and diseases of the adrenal glands. This medicine may be used for other purposes; ask your health care provider or pharmacist if you have questions. COMMON BRAND NAME(S): CUSHINGS SYNDROME DIAGNOSTIC, Decadron, Dexabliss, DexPak Jr TaperPak, DexPak TaperPak, Dxevo, Hemady, HiDex, TaperDex, ZCORT, Zema-Pak, ZoDex, ZonaCort 11 Day, ZonaCort 7 Day What should I tell my health care provider before I take this medicine? They need to know if you have any of these conditions:  Cushing's syndrome  diabetes  glaucoma  heart disease  high blood pressure  infection like herpes, measles, tuberculosis, or chickenpox  kidney disease  liver disease  mental illness  myasthenia gravis  osteoporosis  previous heart attack  seizures  stomach or intestine problems  thyroid disease  an unusual or allergic reaction to dexamethasone, corticosteroids, other medicines, lactose, foods, dyes, or preservatives  pregnant or trying to get pregnant  breast-feeding How should I use this medicine? Take this medicine by mouth with a drink of water. Follow the directions on the prescription label. Take it with food or milk to avoid stomach upset. If you are taking this medicine once a day, take it in the morning. Do not take more medicine than you are told to take. Do not suddenly stop taking your medicine because you may develop a severe reaction. Your doctor will tell you how much medicine to take. If your doctor wants you to stop the medicine, the dose may be slowly lowered over time to avoid any side effects. Talk to your pediatrician regarding the use of this medicine in children. Special  care may be needed. Patients over 22 years old may have a stronger reaction and need a smaller dose. Overdosage: If you think you have taken too much of this medicine contact a poison control center or emergency room at once. NOTE: This medicine is only for you. Do not share this medicine with others. What if I miss a dose? If you miss a dose, take it as soon as you can. If it is almost time for your next dose, talk to your doctor or health care professional. You may need to miss a dose or take an extra dose. Do not take double or extra doses without advice. What may interact with this medicine? Do not take this medicine with any of the following medications:  live virus vaccines This medicine may also interact with the following medications:  aminoglutethimide  amphotericin B  aspirin and aspirin-like medicines  certain antibiotics like erythromycin, clarithromycin, and troleandomycin  certain antivirals for HIV or hepatitis  certain medicines for seizures like carbamazepine, phenobarbital, phenytoin  certain medicines to treat myasthenia gravis  cholestyramine  cyclosporine  digoxin  diuretics  ephedrine  female hormones, like estrogen or progestins and birth control pills  insulin or other medicines for diabetes  isoniazid  ketoconazole  medicines that relax muscles for surgery  mifepristone  NSAIDs, medicines for pain and inflammation, like ibuprofen or naproxen  rifampin  skin tests for allergies  thalidomide  vaccines  warfarin This list may not describe all possible interactions. Give your health care provider a list of all the medicines, herbs, non-prescription drugs, or dietary supplements you use. Also tell them if you smoke, drink alcohol, or  use illegal drugs. Some items may interact with your medicine. What should I watch for while using this medicine? Visit your health care professional for regular checks on your progress. Tell your health  care professional if your symptoms do not start to get better or if they get worse. Your condition will be monitored carefully while you are receiving this medicine. Wear a medical ID bracelet or chain. Carry a card that describes your disease and details of your medicine and dosage times. This medicine may increase your risk of getting an infection. Call your health care professional for advice if you get a fever, chills, or sore throat, or other symptoms of a cold or flu. Do not treat yourself. Try to avoid being around people who are sick. Call your health care professional if you are around anyone with measles, chickenpox, or if you develop sores or blisters that do not heal properly. If you are going to need surgery or other procedures, tell your doctor or health care professional that you have taken this medicine within the last 12 months. Ask your doctor or health care professional about your diet. You may need to lower the amount of salt you eat. This medicine may increase blood sugar. Ask your healthcare provider if changes in diet or medicines are needed if you have diabetes. What side effects may I notice from receiving this medicine? Side effects that you should report to your doctor or health care professional as soon as possible:  allergic reactions like skin rash, itching or hives, swelling of the face, lips, or tongue  bloody or black, tarry stools  changes in emotions or moods  changes in vision  confusion, excitement, restlessness  depressed mood  eye pain  hallucinations  fever or chills, cough, sore throat, pain or difficulty passing urine  muscle weakness  severe or sudden stomach or belly pain  signs and symptoms of high blood sugar such as being more thirsty or hungry or having to urinate more than normal. You may also feel very tired or have blurry vision.  signs and symptoms of infection like fever; chills; cough; sore throat; pain or trouble passing  urine  swelling of ankles, feet  unusual bruising or bleeding  wounds that do not heal Side effects that usually do not require medical attention (report to your doctor or health care professional if they continue or are bothersome):  increased appetite  increased growth of face or body hair  headache  nausea, vomiting  skin problems, acne, thin and shiny skin  trouble sleeping  weight gain This list may not describe all possible side effects. Call your doctor for medical advice about side effects. You may report side effects to FDA at 1-800-FDA-1088. Where should I keep my medicine? Keep out of the reach of children. Store at room temperature between 20 and 25 degrees C (68 and 77 degrees F). Protect from light. Throw away any unused medicine after the expiration date. NOTE: This sheet is a summary. It may not cover all possible information. If you have questions about this medicine, talk to your doctor, pharmacist, or health care provider.  2020 Elsevier/Gold Standard (2019-05-09 14:23:34) Diphenhydramine capsules or tablets What is this medicine? DIPHENHYDRAMINE (dye fen HYE dra meen) is an antihistamine. It is used to treat the symptoms of an allergic reaction. It is also used to treat Parkinson's disease. This medicine is also used to prevent and to treat motion sickness and as a nighttime sleep aid. This medicine may be  used for other purposes; ask your health care provider or pharmacist if you have questions. COMMON BRAND NAME(S): Alka-Seltzer Plus Allergy, Aller-G-Time, Banophen, Benadryl Allergy, Benadryl Allergy Dye Free, Benadryl Allergy Kapgel, Benadryl Allergy Ultratab, Diphedryl, Diphenhist, Genahist, Geri-Dryl, PHARBEDRYL, Q-Dryl, Gretta Began, Valu-Dryl, Vicks ZzzQuil Nightime Sleep-Aid What should I tell my health care provider before I take this medicine? They need to know if you have any of these conditions:  asthma or lung disease  glaucoma  high blood  pressure or heart disease  liver disease  pain or difficulty passing urine  prostate trouble  ulcers or other stomach problems  an unusual or allergic reaction to diphenhydramine, other medicines foods, dyes, or preservatives such as sulfites  pregnant or trying to get pregnant  breast-feeding How should I use this medicine? Take this medicine by mouth with a full glass of water. Follow the directions on the prescription label. Take your doses at regular intervals. Do not take your medicine more often than directed. To prevent motion sickness start taking this medicine 30 to 60 minutes before you leave. Talk to your pediatrician regarding the use of this medicine in children. Special care may be needed. Patients over 32 years old may have a stronger reaction and need a smaller dose. Overdosage: If you think you have taken too much of this medicine contact a poison control center or emergency room at once. NOTE: This medicine is only for you. Do not share this medicine with others. What if I miss a dose? If you miss a dose, take it as soon as you can. If it is almost time for your next dose, take only that dose. Do not take double or extra doses. What may interact with this medicine? Do not take this medicine with any of the following medications:  MAOIs like Carbex, Eldepryl, Marplan, Nardil, and Parnate This medicine may also interact with the following medications:  alcohol  barbiturates, like phenobarbital  medicines for bladder spasm like oxybutynin, tolterodine  medicines for blood pressure  medicines for depression, anxiety, or psychotic disturbances  medicines for movement abnormalities or Parkinson's disease  medicines for sleep  other medicines for cold, cough or allergy  some medicines for the stomach like chlordiazepoxide, dicyclomine This list may not describe all possible interactions. Give your health care provider a list of all the medicines, herbs,  non-prescription drugs, or dietary supplements you use. Also tell them if you smoke, drink alcohol, or use illegal drugs. Some items may interact with your medicine. What should I watch for while using this medicine? Visit your doctor or health care professional for regular check ups. Tell your doctor if your symptoms do not improve or if they get worse. Your mouth may get dry. Chewing sugarless gum or sucking hard candy, and drinking plenty of water may help. Contact your doctor if the problem does not go away or is severe. This medicine may cause dry eyes and blurred vision. If you wear contact lenses you may feel some discomfort. Lubricating drops may help. See your eye doctor if the problem does not go away or is severe. You may get drowsy or dizzy. Do not drive, use machinery, or do anything that needs mental alertness until you know how this medicine affects you. Do not stand or sit up quickly, especially if you are an older patient. This reduces the risk of dizzy or fainting spells. Alcohol may interfere with the effect of this medicine. Avoid alcoholic drinks. What side effects may I notice from receiving  this medicine? Side effects that you should report to your doctor or health care professional as soon as possible:  allergic reactions like skin rash, itching or hives, swelling of the face, lips, or tongue  changes in vision  confused, agitated, nervous  irregular or fast heartbeat  tremor  trouble passing urine  unusual bleeding or bruising  unusually weak or tired Side effects that usually do not require medical attention (report to your doctor or health care professional if they continue or are bothersome):  constipation, diarrhea  drowsy  headache  loss of appetite  stomach upset, vomiting  thick mucous This list may not describe all possible side effects. Call your doctor for medical advice about side effects. You may report side effects to FDA at  1-800-FDA-1088. Where should I keep my medicine? Keep out of the reach of children. This medicine can be abused. Keep your medicine in a safe place. Store at room temperature between 15 and 30 degrees C (59 and 86 degrees F). Keep container closed tightly. Throw away any unused medicine after the expiration date. NOTE: This sheet is a summary. It may not cover all possible information. If you have questions about this medicine, talk to your doctor, pharmacist, or health care provider.  2020 Elsevier/Gold Standard (2019-08-04 10:18:35) Acetaminophen tablets or caplets What is this medicine? ACETAMINOPHEN (a set a MEE noe fen) is a pain reliever. It is used to treat mild pain and fever. This medicine may be used for other purposes; ask your health care provider or pharmacist if you have questions. COMMON BRAND NAME(S): Aceta, Actamin, Anacin Aspirin Free, Genapap, Genebs, Mapap, Pain & Fever, Pain and Fever, PAIN RELIEF, PAIN RELIEF Extra Strength, Pain Reliever, Panadol, PHARBETOL, Q-Pap, Q-Pap Extra Strength, Tylenol, Tylenol CrushableTablet, Tylenol Extra Strength, XS No Aspirin, XS Pain Reliever What should I tell my health care provider before I take this medicine? They need to know if you have any of these conditions:  if you often drink alcohol  liver disease  an unusual or allergic reaction to acetaminophen, other medicines, foods, dyes, or preservatives  pregnant or trying to get pregnant  breast-feeding How should I use this medicine? Take this medicine by mouth with a glass of water. Follow the directions on the package or prescription label. Take your medicine at regular intervals. Do not take your medicine more often than directed. Talk to your pediatrician regarding the use of this medicine in children. While this drug may be prescribed for children as young as 72 years of age for selected conditions, precautions do apply. Overdosage: If you think you have taken too much of this  medicine contact a poison control center or emergency room at once. NOTE: This medicine is only for you. Do not share this medicine with others. What if I miss a dose? If you miss a dose, take it as soon as you can. If it is almost time for your next dose, take only that dose. Do not take double or extra doses. What may interact with this medicine?  alcohol  imatinib  isoniazid  other medicines with acetaminophen This list may not describe all possible interactions. Give your health care provider a list of all the medicines, herbs, non-prescription drugs, or dietary supplements you use. Also tell them if you smoke, drink alcohol, or use illegal drugs. Some items may interact with your medicine. What should I watch for while using this medicine? Tell your doctor or health care professional if the pain lasts more than  10 days (5 days for children), if it gets worse, or if there is a new or different kind of pain. Also, check with your doctor if a fever lasts for more than 3 days. Do not take other medicines that contain acetaminophen with this medicine. Always read labels carefully. If you have questions, ask your doctor or pharmacist. If you take too much acetaminophen get medical help right away. Too much acetaminophen can be very dangerous and cause liver damage. Even if you do not have symptoms, it is important to get help right away. What side effects may I notice from receiving this medicine? Side effects that you should report to your doctor or health care professional as soon as possible:  allergic reactions like skin rash, itching or hives, swelling of the face, lips, or tongue  breathing problems  fever or sore throat  redness, blistering, peeling or loosening of the skin, including inside the mouth  trouble passing urine or change in the amount of urine  unusual bleeding or bruising  unusually weak or tired  yellowing of the eyes or skin Side effects that usually do not  require medical attention (report to your doctor or health care professional if they continue or are bothersome):  headache  nausea, stomach upset This list may not describe all possible side effects. Call your doctor for medical advice about side effects. You may report side effects to FDA at 1-800-FDA-1088. Where should I keep my medicine? Keep out of reach of children. Store at room temperature between 20 and 25 degrees C (68 and 77 degrees F). Protect from moisture and heat. Throw away any unused medicine after the expiration date. NOTE: This sheet is a summary. It may not cover all possible information. If you have questions about this medicine, talk to your doctor, pharmacist, or health care provider.  2020 Elsevier/Gold Standard (2013-06-19 12:54:16) Daratumumab injection What is this medicine? DARATUMUMAB (dar a toom ue mab) is a monoclonal antibody. It is used to treat multiple myeloma. This medicine may be used for other purposes; ask your health care provider or pharmacist if you have questions. COMMON BRAND NAME(S): DARZALEX What should I tell my health care provider before I take this medicine? They need to know if you have any of these conditions:  infection (especially a virus infection such as chickenpox, herpes, or hepatitis B virus)  lung or breathing disease  an unusual or allergic reaction to daratumumab, other medicines, foods, dyes, or preservatives  pregnant or trying to get pregnant  breast-feeding How should I use this medicine? This medicine is for infusion into a vein. It is given by a health care professional in a hospital or clinic setting. Talk to your pediatrician regarding the use of this medicine in children. Special care may be needed. Overdosage: If you think you have taken too much of this medicine contact a poison control center or emergency room at once. NOTE: This medicine is only for you. Do not share this medicine with others. What if I miss  a dose? Keep appointments for follow-up doses as directed. It is important not to miss your dose. Call your doctor or health care professional if you are unable to keep an appointment. What may interact with this medicine? Interactions have not been studied. This list may not describe all possible interactions. Give your health care provider a list of all the medicines, herbs, non-prescription drugs, or dietary supplements you use. Also tell them if you smoke, drink alcohol, or use illegal  drugs. Some items may interact with your medicine. What should I watch for while using this medicine? This drug may make you feel generally unwell. Report any side effects. Continue your course of treatment even though you feel ill unless your doctor tells you to stop. This medicine can cause serious allergic reactions. To reduce your risk you may need to take medicine before treatment with this medicine. Take your medicine as directed. This medicine can affect the results of blood tests to match your blood type. These changes can last for up to 6 months after the final dose. Your healthcare provider will do blood tests to match your blood type before you start treatment. Tell all of your healthcare providers that you are being treated with this medicine before receiving a blood transfusion. This medicine can affect the results of some tests used to determine treatment response; extra tests may be needed to evaluate response. Do not become pregnant while taking this medicine or for 3 months after stopping it. Women should inform their doctor if they wish to become pregnant or think they might be pregnant. There is a potential for serious side effects to an unborn child. Talk to your health care professional or pharmacist for more information. What side effects may I notice from receiving this medicine? Side effects that you should report to your doctor or health care professional as soon as possible:  allergic  reactions like skin rash, itching or hives, swelling of the face, lips, or tongue  breathing problems  chills  cough  dizziness  feeling faint or lightheaded  headache  low blood counts - this medicine may decrease the number of white blood cells, red blood cells and platelets. You may be at increased risk for infections and bleeding.  nausea, vomiting  shortness of breath  signs of decreased platelets or bleeding - bruising, pinpoint red spots on the skin, black, tarry stools, blood in the urine  signs of decreased red blood cells - unusually weak or tired, feeling faint or lightheaded, falls  signs of infection - fever or chills, cough, sore throat, pain or difficulty passing urine  signs and symptoms of liver injury like dark yellow or brown urine; general ill feeling or flu-like symptoms; light-colored stools; loss of appetite; right upper belly pain; unusually weak or tired; yellowing of the eyes or skin Side effects that usually do not require medical attention (report to your doctor or health care professional if they continue or are bothersome):  back pain  constipation  diarrhea  joint pain  muscle cramps  pain, tingling, numbness in the hands or feet  swelling of the ankles, feet, hands  tiredness  trouble sleeping This list may not describe all possible side effects. Call your doctor for medical advice about side effects. You may report side effects to FDA at 1-800-FDA-1088. Where should I keep my medicine? This drug is given in a hospital or clinic and will not be stored at home. NOTE: This sheet is a summary. It may not cover all possible information. If you have questions about this medicine, talk to your doctor, pharmacist, or health care provider.  2020 Elsevier/Gold Standard (2019-07-04 18:10:54)

## 2020-09-16 DIAGNOSIS — R3911 Hesitancy of micturition: Secondary | ICD-10-CM | POA: Diagnosis not present

## 2020-09-16 DIAGNOSIS — R519 Headache, unspecified: Secondary | ICD-10-CM | POA: Diagnosis not present

## 2020-09-16 DIAGNOSIS — I10 Essential (primary) hypertension: Secondary | ICD-10-CM | POA: Diagnosis not present

## 2020-09-16 DIAGNOSIS — M545 Low back pain, unspecified: Secondary | ICD-10-CM | POA: Diagnosis not present

## 2020-09-17 ENCOUNTER — Ambulatory Visit (INDEPENDENT_AMBULATORY_CARE_PROVIDER_SITE_OTHER): Payer: Medicare Other | Admitting: Family

## 2020-09-17 ENCOUNTER — Other Ambulatory Visit: Payer: Self-pay | Admitting: *Deleted

## 2020-09-17 ENCOUNTER — Other Ambulatory Visit: Payer: Self-pay

## 2020-09-17 ENCOUNTER — Other Ambulatory Visit: Payer: Self-pay | Admitting: Pharmacist

## 2020-09-17 ENCOUNTER — Ambulatory Visit (HOSPITAL_BASED_OUTPATIENT_CLINIC_OR_DEPARTMENT_OTHER)
Admission: RE | Admit: 2020-09-17 | Discharge: 2020-09-17 | Disposition: A | Discharge status: Home / Self Care | Payer: Medicare Other | Source: Ambulatory Visit | Attending: Family | Admitting: Family

## 2020-09-17 VITALS — BP 162/81 | HR 99 | Temp 98.2°F | Resp 16 | Ht 66.0 in | Wt 197.0 lb

## 2020-09-17 DIAGNOSIS — C9002 Multiple myeloma in relapse: Secondary | ICD-10-CM

## 2020-09-17 DIAGNOSIS — G43909 Migraine, unspecified, not intractable, without status migrainosus: Secondary | ICD-10-CM | POA: Diagnosis not present

## 2020-09-17 DIAGNOSIS — M545 Low back pain, unspecified: Secondary | ICD-10-CM

## 2020-09-17 DIAGNOSIS — E876 Hypokalemia: Secondary | ICD-10-CM

## 2020-09-17 DIAGNOSIS — C9000 Multiple myeloma not having achieved remission: Secondary | ICD-10-CM

## 2020-09-17 LAB — POC URINALSYSI DIPSTICK (AUTOMATED)
Bilirubin, UA: NEGATIVE
Blood, UA: NEGATIVE
Glucose, UA: NEGATIVE
Ketones, UA: NEGATIVE
Leukocytes, UA: NEGATIVE
Nitrite, UA: NEGATIVE
Protein, UA: POSITIVE — AB
Spec Grav, UA: 1.025 (ref 1.010–1.025)
Urobilinogen, UA: 0.2 E.U./dL
pH, UA: 6 (ref 5.0–8.0)

## 2020-09-17 MED ORDER — FLUCONAZOLE 150 MG PO TABS: 150.0000 mg | ORAL_TABLET | Freq: Once | ORAL | 0 refills | Status: AC

## 2020-09-17 MED ORDER — METHYLPREDNISOLONE 4 MG PO TBPK: ORAL_TABLET | ORAL | 0 refills | Status: DC

## 2020-09-17 MED ORDER — KETOROLAC TROMETHAMINE 60 MG/2ML IM SOLN
60.0000 mg | Freq: Once | INTRAMUSCULAR | Status: AC
  Administered 2020-09-17: 60 mg via INTRAMUSCULAR

## 2020-09-17 MED ORDER — SUMATRIPTAN SUCCINATE 50 MG PO TABS: 50.0000 mg | ORAL_TABLET | ORAL | 2 refills | Status: DC | PRN

## 2020-09-17 NOTE — Patient Instructions (Signed)
Take your table of decadron tonight and tomorrow as directed by Dr. Marin Olp.  Beginning Thursday you can start the Medrol dose pack I gave you and continue until next Tuesday night. Next Tuesday night- stop Medrol dose pak and take the decadron doses per Dr. Antonieta Pert instructions.  You may use imitrex as needed for migraine.  (max 2 tabs in 24 hrs) You may [place] hydrocodone as needed for pain.

## 2020-09-17 NOTE — Progress Notes (Signed)
Subjective:    [name], female    DOB: 12-28-1955, 64 y.o.   MRN: [id number]  HPI  Patient is a 64 yr old female with history of active multiple myeloma, who presents today with chief complaint of low back pain. Pain started on Saturday and is located across the lower mid back. She has tried tylenol without improvement. She reports pain is severe and rates it 10/10.  Reports that she was treated for UTI 3 weeks ago.  Notes mild vaginal irritation.    She denies known injury to her back.    Reports that she has had a migraine since the weekend. She has been taking her topamax without improvement.  Review of Systems    see HPI  Past Medical History:  Diagnosis Date   Abnormal SPEP 07/26/2016   Arthritis    knees, hands   Back pain 07/14/2016   Bowel obstruction ([place]) 11/2014   C. 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   Low back pain 07/14/2016   Monoclonal gammopathy of unknown significance (MGUS) 09/16/2016   Multiple myeloma ([place]) 07/20/2018   Multiple myeloma not having achieved remission ([place]) 07/20/2018   Myalgia 12/31/2016   Obstructive sleep apnea 03/31/2015   SOB (shortness of breath) 04/09/2016   Stroke ([place])    TIAs   UTI (urinary tract infection)      Social History   Socioeconomic History   Marital status: Divorced    Spouse name: [name]   Number of children: 3   Years of education: [name]   Highest education level: High school graduate  Occupational History   [name]  Tobacco Use   Smoking status: Never Smoker   Smokeless tobacco: Never Used  Vaping Use   Vaping Use: Never used  Substance and Sexual  Activity   Alcohol use: No   Drug use: No   Sexual activity: [name]  Other Topics Concern   [name]  Social History Narrative   Lives at home alone   Retired   Caffeine: coffee   Social Determinants of Health   Financial Resource Strain:    Difficulty of Paying Living Expenses: [name]  Food Insecurity:    Worried About Charity fundraiser in the Last Year: [name]   [employer] of Food in the Last Year: [name]  Transportation Needs:    Lack of Transportation (Medical): [name]   Lack of Transportation (Non-Medical): [name]  Physical Activity:    Days of Exercise per Week: [name]   Minutes of Exercise per Session: [name]  Stress:    Feeling of Stress : [name]  Social Connections:    Frequency of Communication with Friends and Family: [name]   Frequency of Social Gatherings with Friends and Family: [name]   Attends Religious Services: [name]   Active Member of Clubs or Organizations: [name]   Attends Archivist Meetings: [name]   Marital Status: [name]  Intimate Partner Violence:  Fear of Current or Ex-Partner: [name]   Emotionally Abused: [name]   Physically Abused: [name]   Sexually Abused: [name]    Past Surgical History:  Procedure Laterality Date   ABDOMINAL HYSTERECTOMY     menorraghia, 2006, total   CHOLECYSTECTOMY     COLONOSCOPY  2018   [facility] per patient   ESOPHAGOGASTRODUODENOSCOPY  2018   [facility]    KNEE SURGERY     right, repair torn torn cartialage   PACEMAKER INSERTION  08/2014   TONSILLECTOMY     TUBAL LIGATION      Family History  Problem Relation Age of Onset   Diabetes Mother    Hypertension Mother    Heart disease Mother        s/p 1 stent   Hyperlipidemia Mother    Arthritis Mother    Cancer Father        COLON   Colon cancer Father 17   Irritable bowel  syndrome Sister    Hyperlipidemia Daughter    Hypertension Daughter    Hypertension Maternal Grandmother    Arthritis Maternal Grandmother    Heart disease Maternal Grandfather        MI   Hypertension Maternal Grandfather    Arthritis Maternal Grandfather    Hypertension Son    Esophageal cancer Neg Hx     Allergies  Allergen Reactions   Benazepril Anaphylaxis, Swelling and Hives    angioedema Throat and lip swelling   Ondansetron Hcl Hives    Redness and hives post IV admin on 07/05/17   Codeine Nausea And Vomiting   Morphine Hives    Redness and hives noted post IV admin on 07/05/17    Current Outpatient Medications on File Prior to Visit  Medication Sig Dispense Refill   acetaminophen (TYLENOL) 500 MG tablet Take 500 mg by mouth every 6 (six) hours as needed.     acyclovir (ZOVIRAX) 400 MG tablet Take 1 tablet (400 mg total) by mouth 2 (two) times daily. 60 tablet 11   albuterol (PROVENTIL HFA;VENTOLIN HFA) 108 (90 Base) MCG/ACT inhaler Inhale 2 puffs into the lungs every 6 (six) hours as needed for wheezing or shortness of breath. 1 Inhaler 2   aspirin EC 81 MG tablet Take 1 tablet (81 mg total) by mouth daily. 90 tablet 1   carvedilol (COREG) 25 MG tablet Take 1 tablet (25 mg total) by mouth 2 (two) times daily with a meal. 180 tablet 1   Cholecalciferol (VITAMIN D-3) 25 MCG (1000 UT) CAPS Take 1 capsule (1,000 Units total) by mouth daily. 60 capsule 2   conjugated estrogens (PREMARIN) vaginal cream Place 1 Applicatorful vaginally as needed.      cyclobenzaprine (FLEXERIL) 10 MG tablet Take 1 tablet (10 mg total) by mouth at bedtime. 7 tablet 0   dexamethasone (DECADRON) 4 MG tablet Take 1 tablet (4 mg total) by mouth daily. Take for 2 days starting the night of chemotherapy. 20 tablet 4   fluticasone (FLONASE) 50 MCG/ACT nasal spray Place 2 sprays into both nostrils daily. 16 g 0   furosemide (LASIX) 20 MG tablet Take 1 tablet by mouth as directed.      guaiFENesin (MUCINEX) 600 MG 12 hr tablet Take 600 mg by mouth 2 (two) times daily.     losartan (COZAAR) 25 MG tablet Take 25 mg by mouth. Take two tablets, total of 50 mg daily     magnesium oxide (MAG-OX)  400 MG tablet Take 1 tablet (400 mg total) by mouth 2 (two) times daily. 60 tablet 1   meclizine (ANTIVERT) 12.5 MG tablet Take 12.5 mg by mouth 3 (three) times daily.     melatonin 3 MG TABS tablet Take 3 mg by mouth at bedtime.     montelukast (SINGULAIR) 10 MG tablet Take 1 tablet (10 mg total) by mouth at bedtime. Start taking Singulair on 11/16/2019. 20 tablet 0   Multiple Vitamins-Minerals (MULTIVITAMIN ADULT PO) Take 1 tablet by mouth daily.      NON FORMULARY Take 1 capsule by mouth daily. Colon Health Daily Probiotic 4- in - one     ondansetron (ZOFRAN) 8 MG tablet Take 1 tablet (8 mg total) by mouth 2 (two) times daily as needed (Nausea or vomiting). 30 tablet 1   pantoprazole (PROTONIX) 40 MG tablet Take 1 tablet (40 mg total) by mouth 2 (two) times daily. 180 tablet 1   prochlorperazine (COMPAZINE) 10 MG tablet Take 1 tablet (10 mg total) by mouth every 6 (six) hours as needed (Nausea or vomiting). 30 tablet 1   promethazine (PHENERGAN) 25 MG tablet Take 1 tablet (25 mg total) by mouth every 6 (six) hours as needed for nausea or vomiting. 30 tablet 0   spironolactone (ALDACTONE) 25 MG tablet Take 1 tablet by mouth daily.     sucralfate (CARAFATE) 1 g tablet Take 1 tablet (1 g total) by mouth 3 (three) times daily as needed. prn 90 tablet 1   topiramate (TOPAMAX) 50 MG tablet Take 1 tablet (50 mg total) by mouth 2 (two) times daily. 60 tablet 3   VITAMIN D PO Take 1,000 Units by mouth daily.     No current facility-administered medications on file prior to visit.    BP (!) 162/81 (BP Location: Right Arm, Patient Position: Sitting, Cuff Size: Large)    Pulse 99    Temp 98.2 F (36.8 C) (Oral)    Resp 16    Ht _0  (1.676 m)    Wt 197 lb (89.4 kg)    SpO2 99%     BMI 31.80 kg/m    Objective:   Physical Exam Constitutional:      Appearance: She is well-developed.     Comments: Appears unable to get comfortable. Crying due to pain  Neck:     Thyroid: No thyromegaly.  Cardiovascular:     Rate and Rhythm: Normal rate and regular rhythm.     Heart sounds: Normal heart sounds. No murmur heard.   Pulmonary:     Effort: Pulmonary effort is normal. No respiratory distress.     Breath sounds: Normal breath sounds. No wheezing.  Musculoskeletal:     Cervical back: Neck supple.     Comments: No tenderness to palpation of thoracic or lumbar spine.   Skin:    General: Skin is warm and dry.  Neurological:     Mental Status: She is alert and oriented to person, place, and time.     Comments: Moving all extremities  Psychiatric:        Behavior: Behavior normal.        Thought Content: Thought content normal.        Judgment: Judgment normal.           Assessment & Plan:  Vaginitis- likely yeast given recent abx.   Severe low back pain- will rx with toradol 31mIM.    I am concerned about the possibility of lytic spinal lesions  given her hx of active multiple myeloma. Will obtain an x-ray of the lumbar spine.  Rx hydrocodone prn pain (she has tolerated multiple times in the past).  Will also give her a medrol dose pak.  She is scheduled to take decadron tonight and tomorrow night for per oncology.  I advised her as follows:  Take your table of decadron tonight and tomorrow as directed by Dr. Marin Olp.  Beginning Thursday you can start the Medrol dose pack I gave you and continue until next Tuesday night. Next Tuesday night- stop Medrol dose pak and take the decadron doses per Dr. Antonieta Pert instructions.   Migraine- uncontrolled. Continue topamax, add prn imitrex.    This visit occurred during the SARS-CoV-2 public health emergency.  Safety protocols were in place, including screening questions prior to the visit, additional usage of staff PPE,  and extensive cleaning of exam room while observing appropriate contact time as indicated for disinfecting solutions.

## 2020-09-18 ENCOUNTER — Inpatient Hospital Stay: Payer: Medicare Other

## 2020-09-18 ENCOUNTER — Inpatient Hospital Stay (HOSPITAL_BASED_OUTPATIENT_CLINIC_OR_DEPARTMENT_OTHER): Payer: Medicare Other | Admitting: Hematology & Oncology

## 2020-09-18 ENCOUNTER — Telehealth: Payer: Self-pay | Admitting: Family

## 2020-09-18 VITALS — BP 148/89 | HR 98 | Temp 98.1°F | Resp 17

## 2020-09-18 DIAGNOSIS — C9000 Multiple myeloma not having achieved remission: Secondary | ICD-10-CM

## 2020-09-18 DIAGNOSIS — C9002 Multiple myeloma in relapse: Secondary | ICD-10-CM

## 2020-09-18 DIAGNOSIS — J40 Bronchitis, not specified as acute or chronic: Secondary | ICD-10-CM | POA: Diagnosis not present

## 2020-09-18 DIAGNOSIS — E876 Hypokalemia: Secondary | ICD-10-CM

## 2020-09-18 DIAGNOSIS — M545 Low back pain, unspecified: Secondary | ICD-10-CM | POA: Diagnosis not present

## 2020-09-18 DIAGNOSIS — Z5112 Encounter for antineoplastic immunotherapy: Secondary | ICD-10-CM | POA: Diagnosis not present

## 2020-09-18 LAB — CMP (CANCER CENTER ONLY)
ALT: 16 U/L (ref 0–44)
AST: 14 U/L — ABNORMAL LOW (ref 15–41)
Albumin: 3.9 g/dL (ref 3.5–5.0)
Alkaline Phosphatase: 60 U/L (ref 38–126)
Anion gap: 5 (ref 5–15)
BUN: 19 mg/dL (ref 8–23)
CO2: 29 mmol/L (ref 22–32)
Calcium: 9.9 mg/dL (ref 8.9–10.3)
Chloride: 105 mmol/L (ref 98–111)
Creatinine: 0.69 mg/dL (ref 0.44–1.00)
GFR, Estimated: >60 mL/min (ref >60)
Glucose, Bld: 151 mg/dL — ABNORMAL HIGH (ref 70–99)
Potassium: 4.3 mmol/L (ref 3.5–5.1)
Sodium: 139 mmol/L (ref 135–145)
Total Bilirubin: 0.6 mg/dL (ref 0.3–1.2)
Total Protein: 8.4 g/dL — ABNORMAL HIGH (ref 6.5–8.1)

## 2020-09-18 LAB — CBC WITH DIFFERENTIAL (CANCER CENTER ONLY)
Abs Immature Granulocytes: 0.02 10*3/uL (ref 0.00–0.07)
Basophils Absolute: 0 10*3/uL (ref 0.0–0.1)
Basophils Relative: 0 %
Eosinophils Absolute: 0 10*3/uL (ref 0.0–0.5)
Eosinophils Relative: 0 %
HCT: 41.2 % (ref 36.0–46.0)
Hemoglobin: 13.3 g/dL (ref 12.0–15.0)
Immature Granulocytes: 0 %
Lymphocytes Relative: 5 %
Lymphs Abs: 0.4 10*3/uL — ABNORMAL LOW (ref 0.7–4.0)
MCH: 30.1 pg (ref 26.0–34.0)
MCHC: 32.3 g/dL (ref 30.0–36.0)
MCV: 93.2 fL (ref 80.0–100.0)
Monocytes Absolute: 0.2 10*3/uL (ref 0.1–1.0)
Monocytes Relative: 2 %
Neutro Abs: 6.1 10*3/uL (ref 1.7–7.7)
Neutrophils Relative %: 93 %
Platelet Count: 116 10*3/uL — ABNORMAL LOW (ref 150–400)
RBC: 4.42 MIL/uL (ref 3.87–5.11)
RDW: 14.2 % (ref 11.5–15.5)
WBC Count: 6.7 10*3/uL (ref 4.0–10.5)
nRBC: 0 % (ref 0.0–0.2)

## 2020-09-18 MED ORDER — MONTELUKAST SODIUM 10 MG PO TABS
10.0000 mg | ORAL_TABLET | Freq: Once | ORAL | Status: AC
  Administered 2020-09-18: 10 mg via ORAL
  Filled 2020-09-18: qty 1

## 2020-09-18 MED ORDER — DIPHENHYDRAMINE HCL 25 MG PO CAPS
ORAL_CAPSULE | ORAL | Status: AC
  Filled 2020-09-18: qty 2

## 2020-09-18 MED ORDER — DIPHENHYDRAMINE HCL 25 MG PO CAPS
50.0000 mg | ORAL_CAPSULE | Freq: Once | ORAL | Status: AC
  Administered 2020-09-18: 50 mg via ORAL

## 2020-09-18 MED ORDER — DEXAMETHASONE 4 MG PO TABS
ORAL_TABLET | ORAL | Status: AC
  Filled 2020-09-18: qty 5

## 2020-09-18 MED ORDER — ACETAMINOPHEN 325 MG PO TABS
ORAL_TABLET | ORAL | Status: AC
  Filled 2020-09-18: qty 2

## 2020-09-18 MED ORDER — DEXAMETHASONE 4 MG PO TABS
20.0000 mg | ORAL_TABLET | Freq: Once | ORAL | Status: AC
  Administered 2020-09-18: 20 mg via ORAL

## 2020-09-18 MED ORDER — ACETAMINOPHEN 325 MG PO TABS
650.0000 mg | ORAL_TABLET | Freq: Once | ORAL | Status: AC
  Administered 2020-09-18: 650 mg via ORAL

## 2020-09-18 MED ORDER — DARATUMUMAB-HYALURONIDASE-FIHJ 1800-30000 MG-UT/15ML ~~LOC~~ SOLN
1800.0000 mg | Freq: Once | SUBCUTANEOUS | Status: AC
  Administered 2020-09-18: 1800 mg via SUBCUTANEOUS
  Filled 2020-09-18: qty 15

## 2020-09-18 NOTE — Progress Notes (Signed)
Hematology and Oncology Follow Up Visit  Tami [name] [id number] August 20, 1956 64 y.o. 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  I had a message this morning from Tami [name]'s family doctor, Dr. Inda Castle.  Apparently Tami [name] saw her because of severe lower back pain.  She had x-rays done.  Unfortunately the x-rays are not yet read.  Tami [name] feels great right now.  She is on some prednisone.  She apparently has had these back issues.  She saw a doctor in [place].  He recommended some epidural steroids.  Has held like he did some scans on her.  Again I cannot find these in the system.  She has had no pain down the legs.  There is been no bowel or bladder incontinence.  She has had no weakness.  The big news is that her son is getting married on Sunday.  I am glad that she is feeling well so she will be able to enjoy the wedding.  She has had no fever.  There is no nausea or vomiting.  She is started the [employer].  She is I had I think 3 doses so far.  Currently, I would say performance status is ECOG 1.  She does not complain of any headache.    Medications:  Allergies as of 09/18/2020      Reactions   Benazepril Anaphylaxis, Swelling, Hives   angioedema Throat and lip swelling   Ondansetron Hcl Hives   Redness and hives post IV admin on 07/05/17   Codeine Nausea And Vomiting   Morphine Hives   Redness and hives noted post IV admin on 07/05/17      Medication List       Accurate as of September 18, 2020 12:01 PM. If you have any questions, ask your nurse or doctor.        acetaminophen 500 MG tablet Commonly  known as: TYLENOL Take 500 mg by mouth every 6 (six) hours as needed.   acyclovir 400 MG tablet Commonly known as: ZOVIRAX Take 1 tablet (400 mg total) by mouth 2 (two) times daily.   albuterol 108 (90 Base) MCG/ACT inhaler Commonly known as: VENTOLIN HFA Inhale 2 puffs into the lungs every 6 (six) hours as needed for wheezing or shortness of breath.   aspirin EC 81 MG tablet Take 1 tablet (81 mg total) by mouth daily.   carvedilol 25 MG tablet Commonly known as: COREG Take 1 tablet (25 mg total) by mouth 2 (two) times daily with a meal.   cyclobenzaprine 10 MG tablet Commonly known as: FLEXERIL Take 1 tablet (10 mg total) by mouth at bedtime.   dexamethasone 4 MG tablet Commonly known as: DECADRON Take 1 tablet (4 mg total) by mouth daily. Take for 2 days starting the night of chemotherapy.   fluticasone 50 MCG/ACT nasal spray Commonly known as: FLONASE Place 2 sprays into both nostrils daily.   furosemide 20 MG tablet Commonly known as: LASIX Take 1 tablet by mouth as directed.   guaiFENesin 600 MG 12 hr tablet Commonly known as: MUCINEX Take 600 mg by mouth 2 (two) times  daily.   losartan 25 MG tablet Commonly known as: COZAAR Take 25 mg by mouth. Take two tablets, total of 50 mg daily   magnesium oxide 400 MG tablet Commonly known as: MAG-OX Take 1 tablet (400 mg total) by mouth 2 (two) times daily.   meclizine 12.5 MG tablet Commonly known as: ANTIVERT Take 12.5 mg by mouth 3 (three) times daily.   melatonin 3 MG Tabs tablet Take 3 mg by mouth at bedtime.   methylPREDNISolone 4 MG Tbpk tablet Commonly known as: MEDROL DOSEPAK Take per package instructions   montelukast 10 MG tablet Commonly known as: Singulair Take 1 tablet (10 mg total) by mouth at bedtime. Start taking Singulair on 11/16/2019.   MULTIVITAMIN ADULT PO Take 1 tablet by mouth daily.   NON FORMULARY Take 1 capsule by mouth daily. Colon Health Daily Probiotic 4- in - one     ondansetron 8 MG tablet Commonly known as: Zofran Take 1 tablet (8 mg total) by mouth 2 (two) times daily as needed (Nausea or vomiting).   pantoprazole 40 MG tablet Commonly known as: PROTONIX Take 1 tablet (40 mg total) by mouth 2 (two) times daily.   Premarin vaginal cream Generic drug: conjugated estrogens Place 1 Applicatorful vaginally as needed.   prochlorperazine 10 MG tablet Commonly known as: COMPAZINE Take 1 tablet (10 mg total) by mouth every 6 (six) hours as needed (Nausea or vomiting).   promethazine 25 MG tablet Commonly known as: PHENERGAN Take 1 tablet (25 mg total) by mouth every 6 (six) hours as needed for nausea or vomiting.   spironolactone 25 MG tablet Commonly known as: ALDACTONE Take 1 tablet by mouth daily.   sucralfate 1 g tablet Commonly known as: Carafate Take 1 tablet (1 g total) by mouth 3 (three) times daily as needed. prn   SUMAtriptan 50 MG tablet Commonly known as: Imitrex Take 1 tablet (50 mg total) by mouth every 2 (two) hours as needed for migraine. May repeat in 2 hours if headache persists or recurs.   topiramate 50 MG tablet Commonly known as: Topamax Take 1 tablet (50 mg total) by mouth 2 (two) times daily.   VITAMIN D PO Take 1,000 Units by mouth daily.   Vitamin D-3 25 MCG (1000 UT) Caps Take 1 capsule (1,000 Units total) by mouth daily.       Allergies:  Allergies  Allergen Reactions  . Benazepril Anaphylaxis, Swelling and Hives    angioedema Throat and lip swelling  . Ondansetron Hcl Hives    Redness and hives post IV admin on 07/05/17  . Codeine Nausea And Vomiting  . Morphine Hives    Redness and hives noted post IV admin on 07/05/17    Past Medical History, Surgical history, Social history, and Family History were reviewed and updated.  Review of Systems: Review of Systems  Constitutional: Negative.   HENT: Negative.   Eyes: Negative.   Respiratory: Negative.   Cardiovascular: Negative.    Gastrointestinal: Negative.   Genitourinary: Negative.   Musculoskeletal: Negative.   Skin: Negative.   Neurological: Negative.   Endo/Heme/Allergies: Negative.   Psychiatric/Behavioral: Negative.       Physical Exam:  vitals were not taken for this visit.   Wt Readings from Last 3 Encounters:  09/17/20 197 lb (89.4 kg)  08/26/20 195 lb (88.5 kg)  08/23/20 195 lb 1.7 oz (88.5 kg)    Physical Exam Vitals reviewed.  HENT:     Head: Normocephalic and atraumatic.  Eyes:  Pupils: Pupils are equal, round, and reactive to light.  Cardiovascular:     Rate and Rhythm: Normal rate and regular rhythm.     Heart sounds: Normal heart sounds.  Pulmonary:     Effort: Pulmonary effort is normal.     Breath sounds: Normal breath sounds.  Abdominal:     General: Bowel sounds are normal.     Palpations: Abdomen is soft.  Musculoskeletal:        General: No tenderness or deformity. Normal range of motion.     Cervical back: Normal range of motion.  Lymphadenopathy:     Cervical: No cervical adenopathy.  Skin:    General: Skin is warm and dry.     Findings: No erythema or rash.  Neurological:     Mental Status: She is alert and oriented to person, place, and time.  Psychiatric:        Behavior: Behavior normal.        Thought Content: Thought content normal.        Judgment: Judgment normal.      Lab Results  Component Value Date   WBC 6.7 09/18/2020   HGB 13.3 09/18/2020   HCT 41.2 09/18/2020   MCV 93.2 09/18/2020   PLT 116 (L) 09/18/2020   Lab Results  Component Value Date   FERRITIN 2,483 (H) 08/26/2020   IRON 124 08/26/2020   TIBC 284 08/26/2020   UIBC 160 08/26/2020   IRONPCTSAT 44 08/26/2020   Lab Results  Component Value Date   RETICCTPCT 0.8 07/16/2016   RBC 4.42 09/18/2020   RETICCTABS 32,640 07/16/2016   Lab Results  Component Value Date   KPAFRELGTCHN 76.6 (H) 08/26/2020   LAMBDASER 4.8 (L) 08/26/2020   KAPLAMBRATIO 15.96 (H) 08/26/2020   Lab  Results  Component Value Date   IGGSERUM 2,623 (H) 08/26/2020   IGGSERUM 2,720 (H) 08/26/2020   IGA 130 08/26/2020   IGA 127 08/26/2020   IGMSERUM 111 08/26/2020   IGMSERUM 109 08/26/2020   Lab Results  Component Value Date   TOTALPROTELP 8.1 08/26/2020   ALBUMINELP 3.5 08/26/2020   A1GS 0.2 08/26/2020   A2GS 1.0 08/26/2020   BETS 1.1 08/26/2020   BETA2SER 0.5 07/16/2016   GAMS 2.3 (H) 08/26/2020   MSPIKE 1.6 (H) 08/26/2020   SPEI Comment 12/07/2019     Chemistry      Component Value Date/Time   NA 139 09/18/2020 0917   NA 143 11/08/2017 1127   NA 140 12/31/2016 1000   K 4.3 09/18/2020 0917   K 4.0 11/08/2017 1127   K 3.6 12/31/2016 1000   CL 105 09/18/2020 0917   CL 107 11/08/2017 1127   CO2 29 09/18/2020 0917   CO2 25 11/08/2017 1127   CO2 24 12/31/2016 1000   BUN 19 09/18/2020 0917   BUN 19 11/08/2017 1127   BUN 22.3 12/31/2016 1000   CREATININE 0.69 09/18/2020 0917   CREATININE 0.9 11/08/2017 1127   CREATININE 0.8 12/31/2016 1000      Component Value Date/Time   CALCIUM 9.9 09/18/2020 0917   CALCIUM 9.9 11/08/2017 1127   CALCIUM 9.5 12/31/2016 1000   ALKPHOS 60 09/18/2020 0917   ALKPHOS 44 11/08/2017 1127   ALKPHOS 51 12/31/2016 1000   AST 14 (L) 09/18/2020 0917   AST 28 12/31/2016 1000   ALT 16 09/18/2020 0917   ALT 36 11/08/2017 1127   ALT 16 12/31/2016 1000   BILITOT 0.6 09/18/2020 0917   BILITOT 0.46 12/31/2016 1000  Impression and Plan: Tami [name] is a very pleasant 64 yo [place] American female with a previous IgG kappa MGUS with progression to myeloma.  He currently is on Faspro.  She is doing well with this so far.  I really cannot see that she has had a problem with this.  We will have to await the results of the x-rays that she had done.  If there are any obvious problems that might suggest myeloma, then I will get an MRI.    [name], MD 11/10/202112:01 PM

## 2020-09-18 NOTE — Patient Instructions (Signed)
[facility] Discharge Instructions for Patients Receiving Chemotherapy  Today you received the following chemotherapy agents Faspro  To help prevent nausea and vomiting after your treatment, we encourage you to take your nausea medication as prescribed by MD.   If you develop nausea and vomiting that is not controlled by your nausea medication, call the clinic.   BELOW ARE SYMPTOMS THAT SHOULD BE REPORTED IMMEDIATELY:  *FEVER GREATER THAN 100.5 F  *CHILLS WITH OR WITHOUT FEVER  NAUSEA AND VOMITING THAT IS NOT CONTROLLED WITH YOUR NAUSEA MEDICATION  *UNUSUAL SHORTNESS OF BREATH  *UNUSUAL BRUISING OR BLEEDING  TENDERNESS IN MOUTH AND THROAT WITH OR WITHOUT PRESENCE OF ULCERS  *URINARY PROBLEMS  *BOWEL PROBLEMS  UNUSUAL RASH Items with * indicate a potential emergency and should be followed up as soon as possible.  Feel free to call the clinic should you have any questions or concerns. The clinic phone number is [phone].  Please show the [facility] at check-in to the Emergency Department and triage nurse.

## 2020-09-18 NOTE — Telephone Encounter (Signed)
Patient states she saw Debbrah Alar  on  Yesterday, per patient Debbrah Alar was going to send in a syrup hydrocodone . Patient states pharmacy  has not received it

## 2020-09-18 NOTE — Progress Notes (Signed)
Pt discharged in no apparent distress. Pt left ambulatory without assistance. Pt aware of discharge instructions and verbalized understanding and had no further questions.  

## 2020-09-19 ENCOUNTER — Telehealth: Payer: Self-pay

## 2020-09-19 ENCOUNTER — Other Ambulatory Visit: Payer: Self-pay | Admitting: Family Medicine

## 2020-09-19 DIAGNOSIS — R059 Cough, unspecified: Secondary | ICD-10-CM

## 2020-09-19 LAB — URINE CULTURE
MICRO NUMBER:: 11185579
SPECIMEN QUALITY:: ADEQUATE

## 2020-09-19 MED ORDER — HYDROCODONE-HOMATROPINE 5-1.5 MG/5ML PO SYRP: 5.0000 mL | ORAL_SOLUTION | Freq: Four times a day (QID) | ORAL | 0 refills | Status: DC | PRN

## 2020-09-19 NOTE — Telephone Encounter (Signed)
Patient reports she does not have cough symptoms at this time. I told her I did not see on Melissa's note that she was going to send a syrup.  Patient reports Lenna Sciara said she will send it for recurrent cough symptoms.  Patient advised Lenna Sciara will be back on Monday and message will be forwarded to Dr Charlett Blake.

## 2020-09-19 NOTE — Telephone Encounter (Signed)
No los for 09/19/20 visit.... AOM

## 2020-09-19 NOTE — Telephone Encounter (Signed)
I sent her in refill on Hydromet which she has had before with a cough. Please let her know

## 2020-09-20 ENCOUNTER — Other Ambulatory Visit: Payer: Self-pay | Admitting: Family Medicine

## 2020-09-20 DIAGNOSIS — R059 Cough, unspecified: Secondary | ICD-10-CM

## 2020-09-20 NOTE — Telephone Encounter (Signed)
Per Dr. Charlett Blake Let her know the cough syrup is not available. No other liquid cough she can have as she is allergic to codeine so she could consider over the counter Cheritussin

## 2020-09-20 NOTE — Telephone Encounter (Signed)
Left detailed message on machine.

## 2020-09-20 NOTE — Telephone Encounter (Signed)
Left detailed message on machine for patient to get OTC cherritussin.

## 2020-09-20 NOTE — Telephone Encounter (Signed)
We got message from pharmacy that they do not have in stock.  Message sent to Dr. Charlett Blake to see if she can change to something else.

## 2020-09-20 NOTE — Telephone Encounter (Signed)
Let her know the cough syrup is not available. No other liquid cough she can have as she is allergic to codeine so she could consider over the counter Cheritussin

## 2020-09-20 NOTE — Telephone Encounter (Signed)
Message from pharmacy: Change request as medication is out of stock. HYDROMET AND GENERICS ARE CURRENTLY ON LONG-TERM BACKORDER, PLEASE CONSIDER CHANGING TO SOMETHING ELSE.  THANKS.  Please refuse to delete and send in something else if change is possible.

## 2020-09-21 DIAGNOSIS — C9000 Multiple myeloma not having achieved remission: Secondary | ICD-10-CM | POA: Diagnosis not present

## 2020-09-21 DIAGNOSIS — G43809 Other migraine, not intractable, without status migrainosus: Secondary | ICD-10-CM | POA: Diagnosis not present

## 2020-09-23 DIAGNOSIS — Z87440 Personal history of urinary (tract) infections: Secondary | ICD-10-CM | POA: Diagnosis not present

## 2020-09-23 DIAGNOSIS — N281 Cyst of kidney, acquired: Secondary | ICD-10-CM | POA: Diagnosis not present

## 2020-09-23 DIAGNOSIS — R300 Dysuria: Secondary | ICD-10-CM | POA: Diagnosis not present

## 2020-09-24 ENCOUNTER — Other Ambulatory Visit: Payer: Self-pay | Admitting: *Deleted

## 2020-09-24 DIAGNOSIS — C9000 Multiple myeloma not having achieved remission: Secondary | ICD-10-CM

## 2020-09-24 DIAGNOSIS — C9002 Multiple myeloma in relapse: Secondary | ICD-10-CM

## 2020-09-25 ENCOUNTER — Inpatient Hospital Stay: Payer: Medicare Other

## 2020-09-25 ENCOUNTER — Other Ambulatory Visit: Payer: Self-pay

## 2020-09-25 VITALS — BP 134/77 | HR 92 | Temp 97.8°F | Resp 16

## 2020-09-25 DIAGNOSIS — C9000 Multiple myeloma not having achieved remission: Secondary | ICD-10-CM | POA: Diagnosis not present

## 2020-09-25 DIAGNOSIS — C9002 Multiple myeloma in relapse: Secondary | ICD-10-CM

## 2020-09-25 DIAGNOSIS — J40 Bronchitis, not specified as acute or chronic: Secondary | ICD-10-CM | POA: Diagnosis not present

## 2020-09-25 DIAGNOSIS — Z5112 Encounter for antineoplastic immunotherapy: Secondary | ICD-10-CM | POA: Diagnosis not present

## 2020-09-25 LAB — CMP (CANCER CENTER ONLY)
ALT: 19 U/L (ref 0–44)
AST: 15 U/L (ref 15–41)
Albumin: 3.7 g/dL (ref 3.5–5.0)
Alkaline Phosphatase: 67 U/L (ref 38–126)
Anion gap: 9 (ref 5–15)
BUN: 20 mg/dL (ref 8–23)
CO2: 24 mmol/L (ref 22–32)
Calcium: 9.7 mg/dL (ref 8.9–10.3)
Chloride: 107 mmol/L (ref 98–111)
Creatinine: 0.67 mg/dL (ref 0.44–1.00)
GFR, Estimated: >60 mL/min (ref >60)
Glucose, Bld: 150 mg/dL — ABNORMAL HIGH (ref 70–99)
Potassium: 3.8 mmol/L (ref 3.5–5.1)
Sodium: 140 mmol/L (ref 135–145)
Total Bilirubin: 0.5 mg/dL (ref 0.3–1.2)
Total Protein: 8.3 g/dL — ABNORMAL HIGH (ref 6.5–8.1)

## 2020-09-25 LAB — CBC WITH DIFFERENTIAL (CANCER CENTER ONLY)
Abs Immature Granulocytes: 0.02 10*3/uL (ref 0.00–0.07)
Basophils Absolute: 0 10*3/uL (ref 0.0–0.1)
Basophils Relative: 0 %
Eosinophils Absolute: 0 10*3/uL (ref 0.0–0.5)
Eosinophils Relative: 0 %
HCT: 40.5 % (ref 36.0–46.0)
Hemoglobin: 13.2 g/dL (ref 12.0–15.0)
Immature Granulocytes: 0 %
Lymphocytes Relative: 6 %
Lymphs Abs: 0.4 10*3/uL — ABNORMAL LOW (ref 0.7–4.0)
MCH: 30.1 pg (ref 26.0–34.0)
MCHC: 32.6 g/dL (ref 30.0–36.0)
MCV: 92.5 fL (ref 80.0–100.0)
Monocytes Absolute: 0.2 10*3/uL (ref 0.1–1.0)
Monocytes Relative: 3 %
Neutro Abs: 5.4 10*3/uL (ref 1.7–7.7)
Neutrophils Relative %: 91 %
Platelet Count: 122 10*3/uL — ABNORMAL LOW (ref 150–400)
RBC: 4.38 MIL/uL (ref 3.87–5.11)
RDW: 14.2 % (ref 11.5–15.5)
WBC Count: 5.9 10*3/uL (ref 4.0–10.5)
nRBC: 0 % (ref 0.0–0.2)

## 2020-09-25 MED ORDER — DIPHENHYDRAMINE HCL 25 MG PO CAPS
50.0000 mg | ORAL_CAPSULE | Freq: Once | ORAL | Status: AC
  Administered 2020-09-25: 50 mg via ORAL

## 2020-09-25 MED ORDER — DARATUMUMAB-HYALURONIDASE-FIHJ 1800-30000 MG-UT/15ML ~~LOC~~ SOLN
1800.0000 mg | Freq: Once | SUBCUTANEOUS | Status: AC
  Administered 2020-09-25: 1800 mg via SUBCUTANEOUS
  Filled 2020-09-25: qty 15

## 2020-09-25 MED ORDER — ACETAMINOPHEN 325 MG PO TABS
ORAL_TABLET | ORAL | Status: AC
  Filled 2020-09-25: qty 2

## 2020-09-25 MED ORDER — DEXAMETHASONE 4 MG PO TABS
ORAL_TABLET | ORAL | Status: AC
  Filled 2020-09-25: qty 5

## 2020-09-25 MED ORDER — ACETAMINOPHEN 325 MG PO TABS
650.0000 mg | ORAL_TABLET | Freq: Once | ORAL | Status: AC
  Administered 2020-09-25: 650 mg via ORAL

## 2020-09-25 MED ORDER — DEXAMETHASONE 4 MG PO TABS
20.0000 mg | ORAL_TABLET | Freq: Once | ORAL | Status: AC
  Administered 2020-09-25: 20 mg via ORAL

## 2020-09-25 MED ORDER — DIPHENHYDRAMINE HCL 25 MG PO CAPS
ORAL_CAPSULE | ORAL | Status: AC
  Filled 2020-09-25: qty 2

## 2020-09-25 NOTE — Patient Instructions (Signed)
Daratumumab injection What is this medicine? DARATUMUMAB (dar a toom ue mab) is a monoclonal antibody. It is used to treat multiple myeloma. This medicine may be used for other purposes; ask your health care provider or pharmacist if you have questions. COMMON BRAND NAME(S): DARZALEX What should I tell my health care provider before I take this medicine? They need to know if you have any of these conditions:  infection (especially a virus infection such as chickenpox, herpes, or hepatitis B virus)  lung or breathing disease  an unusual or allergic reaction to daratumumab, other medicines, foods, dyes, or preservatives  pregnant or trying to get pregnant  breast-feeding How should I use this medicine? This medicine is for infusion into a vein. It is given by a health care professional in a hospital or clinic setting. Talk to your pediatrician regarding the use of this medicine in children. Special care may be needed. Overdosage: If you think you have taken too much of this medicine contact a poison control center or emergency room at once. NOTE: This medicine is only for you. Do not share this medicine with others. What if I miss a dose? Keep appointments for follow-up doses as directed. It is important not to miss your dose. Call your doctor or health care professional if you are unable to keep an appointment. What may interact with this medicine? Interactions have not been studied. This list may not describe all possible interactions. Give your health care provider a list of all the medicines, herbs, non-prescription drugs, or dietary supplements you use. Also tell them if you smoke, drink alcohol, or use illegal drugs. Some items may interact with your medicine. What should I watch for while using this medicine? This drug may make you feel generally unwell. Report any side effects. Continue your course of treatment even though you feel ill unless your doctor tells you to stop. This  medicine can cause serious allergic reactions. To reduce your risk you may need to take medicine before treatment with this medicine. Take your medicine as directed. This medicine can affect the results of blood tests to match your blood type. These changes can last for up to 6 months after the final dose. Your healthcare provider will do blood tests to match your blood type before you start treatment. Tell all of your healthcare providers that you are being treated with this medicine before receiving a blood transfusion. This medicine can affect the results of some tests used to determine treatment response; extra tests may be needed to evaluate response. Do not become pregnant while taking this medicine or for 3 months after stopping it. Women should inform their doctor if they wish to become pregnant or think they might be pregnant. There is a potential for serious side effects to an unborn child. Talk to your health care professional or pharmacist for more information. What side effects may I notice from receiving this medicine? Side effects that you should report to your doctor or health care professional as soon as possible:  allergic reactions like skin rash, itching or hives, swelling of the face, lips, or tongue  breathing problems  chills  cough  dizziness  feeling faint or lightheaded  headache  low blood counts - this medicine may decrease the number of white blood cells, red blood cells and platelets. You may be at increased risk for infections and bleeding.  nausea, vomiting  shortness of breath  signs of decreased platelets or bleeding - bruising, pinpoint red spots on  the skin, black, tarry stools, blood in the urine  signs of decreased red blood cells - unusually weak or tired, feeling faint or lightheaded, falls  signs of infection - fever or chills, cough, sore throat, pain or difficulty passing urine  signs and symptoms of liver injury like dark yellow or brown  urine; general ill feeling or flu-like symptoms; light-colored stools; loss of appetite; right upper belly pain; unusually weak or tired; yellowing of the eyes or skin Side effects that usually do not require medical attention (report to your doctor or health care professional if they continue or are bothersome):  back pain  constipation  diarrhea  joint pain  muscle cramps  pain, tingling, numbness in the hands or feet  swelling of the ankles, feet, hands  tiredness  trouble sleeping This list may not describe all possible side effects. Call your doctor for medical advice about side effects. You may report side effects to FDA at 1-800-FDA-1088. Where should I keep my medicine? This drug is given in a hospital or clinic and will not be stored at home. NOTE: This sheet is a summary. It may not cover all possible information. If you have questions about this medicine, talk to your doctor, pharmacist, or health care provider.  2020 Elsevier/Gold Standard (2019-07-04 18:10:54)  

## 2020-09-26 DIAGNOSIS — H35712 Central serous chorioretinopathy, left eye: Secondary | ICD-10-CM | POA: Diagnosis not present

## 2020-09-27 ENCOUNTER — Telehealth: Payer: Self-pay | Admitting: Family

## 2020-09-27 ENCOUNTER — Other Ambulatory Visit: Payer: Self-pay | Admitting: Family

## 2020-09-27 NOTE — Telephone Encounter (Signed)
Received pharmacy recommendation to d/c imitrex due to hx of CAD.  I left a message on her voicemail advising her to d/c imitrex and keep her follow up appointment as scheduled with Dr. Charlett Blake.

## 2020-09-29 DIAGNOSIS — I429 Cardiomyopathy, unspecified: Secondary | ICD-10-CM | POA: Diagnosis not present

## 2020-09-29 DIAGNOSIS — R059 Cough, unspecified: Secondary | ICD-10-CM | POA: Diagnosis not present

## 2020-09-30 ENCOUNTER — Other Ambulatory Visit: Payer: Self-pay | Admitting: Family Medicine

## 2020-09-30 ENCOUNTER — Other Ambulatory Visit: Payer: Self-pay

## 2020-09-30 ENCOUNTER — Telehealth (INDEPENDENT_AMBULATORY_CARE_PROVIDER_SITE_OTHER): Payer: Medicare Other | Admitting: Family Medicine

## 2020-09-30 DIAGNOSIS — R739 Hyperglycemia, unspecified: Secondary | ICD-10-CM | POA: Diagnosis not present

## 2020-09-30 DIAGNOSIS — R519 Headache, unspecified: Secondary | ICD-10-CM

## 2020-09-30 DIAGNOSIS — E785 Hyperlipidemia, unspecified: Secondary | ICD-10-CM

## 2020-09-30 DIAGNOSIS — R059 Cough, unspecified: Secondary | ICD-10-CM

## 2020-09-30 DIAGNOSIS — I10 Essential (primary) hypertension: Secondary | ICD-10-CM | POA: Diagnosis not present

## 2020-09-30 DIAGNOSIS — D696 Thrombocytopenia, unspecified: Secondary | ICD-10-CM

## 2020-09-30 DIAGNOSIS — R7989 Other specified abnormal findings of blood chemistry: Secondary | ICD-10-CM | POA: Diagnosis not present

## 2020-09-30 DIAGNOSIS — R0602 Shortness of breath: Secondary | ICD-10-CM

## 2020-09-30 MED ORDER — BUTALBITAL-ACETAMINOPHEN 50-325 MG PO TABS: 1.0000 | ORAL_TABLET | Freq: Three times a day (TID) | ORAL | 0 refills | Status: DC | PRN

## 2020-09-30 MED ORDER — ALBUTEROL SULFATE HFA 108 (90 BASE) MCG/ACT IN AERS: 2.0000 | INHALATION_SPRAY | Freq: Four times a day (QID) | RESPIRATORY_TRACT | 5 refills | Status: DC | PRN

## 2020-09-30 MED ORDER — HYDROCOD POLST-CPM POLST ER 10-8 MG/5ML PO SUER: 5.0000 mL | Freq: Two times a day (BID) | ORAL | 0 refills | Status: DC | PRN

## 2020-09-30 NOTE — Assessment & Plan Note (Addendum)
Patient with severe headache last week, was unable to pick up Imitrex prescription due to h/o CAD. Encouraged increased hydration, 64 ounces of clear fluids daily. Minimize alcohol and caffeine. Eat small frequent meals with lean proteins and complex carbs. Avoid high and low blood sugars. Get adequate sleep, 7-8 hours a night. Needs exercise daily preferably in the morning. She is feeling better with treatment of her sinusitis. Is given a prescription for Bupap to use prn

## 2020-09-30 NOTE — Progress Notes (Signed)
Virtual Visit via phone Note  I connected with Tami [name] on 09/30/20 at  3:00 PM EST by a phone enabled telemedicine application and verified that I am speaking with the correct person using two identifiers.  Location: Patient: home, patient and provider in visit Provider: office   I discussed the limitations of evaluation and management by telemedicine and the availability of in person appointments. The patient expressed understanding and agreed to proceed. S Chism, CMA was able to get the patient setup on a phone visit     Subjective:    [name], female    DOB: 1956/08/16, 64 y.o.   MRN: [id number]  No chief complaint on file.   HPI Patient is in today for follow up on chronic medical concerns. No recent febrile illness. Her headache from last week has improved but she did have to present to emergency room over the weekend with headache, SOB, fatigue. She is feeling better but still not great. Has a headache just less severe. Congestion still present but improving. She continues to feel SOB especially with any exertion. No c/o fevers or chills. Denies CP/palp/fevers/GI or GU c/o. Taking meds as prescribed  Past Medical History:  Diagnosis Date  . Abnormal SPEP 07/26/2016  . Arthritis    knees, hands  . Back pain 07/14/2016  . Bowel obstruction ([place]) 11/2014  . C. difficile diarrhea   . CHF (congestive heart failure) ([place])   . Colitis   . Congestive heart failure ([place]) 02/24/2015   S/p pacemaker  . Depression   . Elevated sed rate 08/15/2017  . Epigastric pain 03/22/2017  . GERD (gastroesophageal reflux disease)   . Heart disease   . Hyperlipidemia   . Hypertension   . 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 Low back pain 07/14/2016  . 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 Multiple myeloma ([place]) 07/20/2018  . Multiple myeloma not having achieved remission ([place]) 07/20/2018  . Myalgia 12/31/2016  . Obstructive sleep apnea  03/31/2015  . SOB (shortness of breath) 04/09/2016  . Stroke ([place])    TIAs  . UTI (urinary tract infection)     Past Surgical History:  Procedure Laterality Date  . ABDOMINAL HYSTERECTOMY     menorraghia, 2006, total  . CHOLECYSTECTOMY    . COLONOSCOPY  2018   [facility] per patient  . ESOPHAGOGASTRODUODENOSCOPY  2018   [facility]   . KNEE SURGERY     right, repair torn torn cartialage  . PACEMAKER INSERTION  08/2014  . TONSILLECTOMY    . TUBAL LIGATION      Family History  Problem Relation Age of Onset  . Diabetes Mother   . Hypertension Mother   . Heart disease Mother        s/p 1 stent  . Hyperlipidemia Mother   . Arthritis Mother   . Cancer Father        COLON  . Colon cancer Father 93  . Irritable bowel syndrome Sister   . Hyperlipidemia Daughter   . Hypertension Daughter   . Hypertension Maternal Grandmother   . Arthritis Maternal Grandmother   . Heart disease Maternal Grandfather        MI  . Hypertension Maternal Grandfather   . Arthritis Maternal Grandfather   . Hypertension Son   . Esophageal cancer Neg Hx     Social History   Socioeconomic History  . Marital status: Divorced    Spouse name: [name]  . Number of  children: 3  . Years of education: [name]  . Highest education level: High school graduate  Occupational History  . [name]  Tobacco Use  . Smoking status: Never Smoker  . Smokeless tobacco: Never Used  Vaping Use  . Vaping Use: Never used  Substance and Sexual Activity  . Alcohol use: No  . Drug use: No  . Sexual activity: [name]  Other Topics Concern  . [name]  Social History Narrative   Lives at home alone   Retired   Caffeine: coffee   Social Determinants of Health   Financial Resource Strain:   . Difficulty of Paying Living Expenses: [name]  Food Insecurity:   . Worried About Charity fundraiser in the Last Year: [name]  . Ran Out of Food in the Last Year: Not on  file  Transportation Needs:   . Lack of Transportation (Medical): [name]  . Lack of Transportation (Non-Medical): [name]  Physical Activity:   . Days of Exercise per Week: [name]  . Minutes of Exercise per Session: [name]  Stress:   . Feeling of Stress : [name]  Social Connections:   . Frequency of Communication with Friends and Family: [name]  . Frequency of Social Gatherings with Friends and Family: [name]  . Attends Religious Services: [name]  . Active Member of Clubs or Organizations: [name]  . Attends Archivist Meetings: [name]  . Marital Status: [name]  Intimate Partner Violence:   . Fear of Current or Ex-Partner: [name]  . Emotionally Abused: [name]  . Physically Abused: [name]  . Sexually Abused: [name]    Outpatient Medications Prior to Visit  Medication Sig Dispense Refill  . acetaminophen (TYLENOL) 500 MG tablet Take 500 mg by mouth every 6 (six) hours as needed.    Marland Kitchen acyclovir (ZOVIRAX) 400 MG tablet Take 1 tablet (400 mg total) by mouth 2 (two) times daily. 60 tablet 11  . albuterol (VENTOLIN HFA) 108 (90 Base) MCG/ACT inhaler Inhale 2 puffs into the lungs every 6 (six) hours as needed for wheezing or shortness of breath. 18 g 5  . aspirin EC 81 MG tablet Take 1 tablet (81 mg total) by mouth daily. 90 tablet 1  . budesonide-formoterol (SYMBICORT) 160-4.5 MCG/ACT inhaler Inhale 2 puffs into the lungs 2 (two) times daily. 1 each 3  . carvedilol (COREG) 25 MG tablet Take 1 tablet (25 mg total) by mouth 2 (two) times daily with a meal. 180 tablet 1  . Cholecalciferol (VITAMIN D-3) 25 MCG (1000 UT) CAPS Take 1 capsule (1,000 Units total) by mouth daily. 60 capsule 2  . conjugated estrogens (PREMARIN) vaginal cream Place 1 Applicatorful vaginally as needed.     . cyclobenzaprine (FLEXERIL) 10 MG tablet Take 1 tablet (10 mg total) by mouth at bedtime. 7 tablet 0  . dexamethasone (DECADRON) 4  MG tablet Take 1 tablet (4 mg total) by mouth daily. Take for 2 days starting the night of chemotherapy. 20 tablet 4  . fluticasone (FLONASE) 50 MCG/ACT nasal spray Place 2 sprays into both nostrils daily. 16 g 0  . furosemide (LASIX) 20 MG tablet Take 1 tablet by mouth as directed.    Marland Kitchen guaiFENesin (MUCINEX) 600 MG 12 hr tablet Take 600 mg by mouth 2 (two) times  daily.    . losartan (COZAAR) 25 MG tablet Take 25 mg by mouth. Take two tablets, total of 50 mg daily    . magnesium oxide (MAG-OX) 400 MG tablet Take 1 tablet (400 mg total) by mouth 2 (two) times daily. 60 tablet 1  . meclizine (ANTIVERT) 12.5 MG tablet Take 12.5 mg by mouth 3 (three) times daily.    . melatonin 3 MG TABS tablet Take 3 mg by mouth at bedtime.    . methylPREDNISolone (MEDROL DOSEPAK) 4 MG TBPK tablet Take per package instructions 21 tablet 0  . montelukast (SINGULAIR) 10 MG tablet Take 1 tablet (10 mg total) by mouth at bedtime. Start taking Singulair on 11/16/2019. 20 tablet 0  . Multiple Vitamins-Minerals (MULTIVITAMIN ADULT PO) Take 1 tablet by mouth daily.     . NON FORMULARY Take 1 capsule by mouth daily. Colon Health Daily Probiotic 4- in - one    . ondansetron (ZOFRAN) 8 MG tablet Take 1 tablet (8 mg total) by mouth 2 (two) times daily as needed (Nausea or vomiting). 30 tablet 1  . pantoprazole (PROTONIX) 40 MG tablet Take 1 tablet (40 mg total) by mouth 2 (two) times daily. 180 tablet 1  . prochlorperazine (COMPAZINE) 10 MG tablet Take 1 tablet (10 mg total) by mouth every 6 (six) hours as needed (Nausea or vomiting). 30 tablet 1  . promethazine (PHENERGAN) 25 MG tablet Take 1 tablet (25 mg total) by mouth every 6 (six) hours as needed for nausea or vomiting. 30 tablet 0  . spironolactone (ALDACTONE) 25 MG tablet Take 1 tablet by mouth daily.    . sucralfate (CARAFATE) 1 g tablet Take 1 tablet (1 g total) by mouth 3 (three) times daily as needed. prn 90 tablet 1  . topiramate (TOPAMAX) 50 MG tablet Take 1 tablet  (50 mg total) by mouth 2 (two) times daily. 60 tablet 3  . VITAMIN D PO Take 1,000 Units by mouth daily.    Marland Kitchen HYDROcodone-homatropine (HYCODAN) 5-1.5 MG/5ML syrup Take 5 mLs by mouth every 6 (six) hours as needed for cough. 140 mL 0   No facility-administered medications prior to visit.    Allergies  Allergen Reactions  . Benazepril Anaphylaxis, Swelling and Hives    angioedema Throat and lip swelling  . Ondansetron Hcl Hives    Redness and hives post IV admin on 07/05/17  . Codeine Nausea And Vomiting  . Morphine Hives    Redness and hives noted post IV admin on 07/05/17    Review of Systems  Constitutional: Positive for malaise/fatigue. Negative for fever.  HENT: Positive for congestion and sinus pain.   Eyes: Negative for blurred vision.  Respiratory: Positive for sputum production and shortness of breath.   Cardiovascular: Negative for chest pain, palpitations and leg swelling.  Gastrointestinal: Negative for abdominal pain, blood in stool and nausea.  Genitourinary: Negative for dysuria and frequency.  Musculoskeletal: Negative for falls.  Skin: Negative for rash.  Neurological: Positive for headaches. Negative for dizziness and loss of consciousness.  Endo/Heme/Allergies: Negative for environmental allergies.  Psychiatric/Behavioral: Negative for depression. The patient is nervous/anxious.        Objective:    Physical Exam unable to obtain via phone visit  There were no vitals taken for this visit. Wt Readings from Last 3 Encounters:  09/17/20 197 lb (89.4 kg)  08/26/20 195 lb (88.5 kg)  08/23/20 195 lb 1.7 oz (88.5 kg)    Diabetic Foot Exam - Simple   No  data filed     Lab Results  Component Value Date   WBC 5.9 09/25/2020   HGB 13.2 09/25/2020   HCT 40.5 09/25/2020   PLT 122 (L) 09/25/2020   GLUCOSE 150 (H) 09/25/2020   CHOL 176 07/30/2020   TRIG 200 (H) 07/30/2020   HDL 40 (L) 07/30/2020   LDLCALC 104 (H) 07/30/2020   ALT 19 09/25/2020   AST 15  09/25/2020   NA 140 09/25/2020   K 3.8 09/25/2020   CL 107 09/25/2020   CREATININE 0.67 09/25/2020   BUN 20 09/25/2020   CO2 24 09/25/2020   TSH 1.76 07/30/2020   INR 0.90 07/28/2018   HGBA1C 5.7 (H) 07/30/2020    Lab Results  Component Value Date   TSH 1.76 07/30/2020   Lab Results  Component Value Date   WBC 5.9 09/25/2020   HGB 13.2 09/25/2020   HCT 40.5 09/25/2020   MCV 92.5 09/25/2020   PLT 122 (L) 09/25/2020   Lab Results  Component Value Date   NA 140 09/25/2020   K 3.8 09/25/2020   CHLORIDE 108 12/31/2016   CO2 24 09/25/2020   GLUCOSE 150 (H) 09/25/2020   BUN 20 09/25/2020   CREATININE 0.67 09/25/2020   BILITOT 0.5 09/25/2020   ALKPHOS 67 09/25/2020   AST 15 09/25/2020   ALT 19 09/25/2020   PROT 8.3 (H) 09/25/2020   ALBUMIN 3.7 09/25/2020   CALCIUM 9.7 09/25/2020   ANIONGAP 9 09/25/2020   EGFR >90 12/31/2016   GFR 106.55 06/04/2020   Lab Results  Component Value Date   CHOL 176 07/30/2020   Lab Results  Component Value Date   HDL 40 (L) 07/30/2020   Lab Results  Component Value Date   LDLCALC 104 (H) 07/30/2020   Lab Results  Component Value Date   TRIG 200 (H) 07/30/2020   Lab Results  Component Value Date   CHOLHDL 4.4 07/30/2020   Lab Results  Component Value Date   HGBA1C 5.7 (H) 07/30/2020       Assessment & Plan:   Problem List Items Addressed This Visit    Hyperlipidemia    Encouraged heart healthy diet, increase exercise, avoid trans fats, consider a krill oil cap daily      Hypertension    Monitor and report any concerns. no changes to meds. Encouraged heart healthy diet such as the DASH diet and exercise as tolerated.       SOB (shortness of breath)    Had to present to the ER over the weekend with SOB, congestion, headache. She is feeling some better now. They started her on Symbicort which she just picked up today. She is encouraged to use 2 puffs twice daily and is given a refill on Albuterol to use prn. Referred  to pulmonology for further evaluation.       Headache    Patient with severe headache last week, was unable to pick up Imitrex prescription due to h/o CAD. Encouraged increased hydration, 64 ounces of clear fluids daily. Minimize alcohol and caffeine. Eat small frequent meals with lean proteins and complex carbs. Avoid high and low blood sugars. Get adequate sleep, 7-8 hours a night. Needs exercise daily preferably in the morning. She is feeling better with treatment of her sinusitis. Is given a prescription for Bupap to use prn       Hyperglycemia    hgba1c acceptable, minimize simple carbs. Increase exercise as tolerated.       Thrombocytopenia ([place])  Mild, asymptomatic, continue to monitor      Low vitamin D level    Supplement and monitor         I have discontinued Lannette Donath HYDROcodone-homatropine. I am also having her maintain her conjugated estrogens, pantoprazole, aspirin EC, Multiple Vitamins-Minerals (MULTIVITAMIN ADULT PO), topiramate, montelukast, fluticasone, cyclobenzaprine, meclizine, magnesium oxide, Vitamin D-3, losartan, spironolactone, melatonin, carvedilol, furosemide, sucralfate, promethazine, VITAMIN D PO, acetaminophen, guaiFENesin, NON FORMULARY, acyclovir, dexamethasone, prochlorperazine, ondansetron, methylPREDNISolone, albuterol, and budesonide-formoterol.  No orders of the defined types were placed in this encounter.  I discussed the assessment and treatment plan with the patient. The patient was provided an opportunity to ask questions and all were answered. The patient agreed with the plan and demonstrated an understanding of the instructions.   The patient was advised to call back or seek an in-person evaluation if the symptoms worsen or if the condition fails to improve as anticipated.  I provided 25 minutes of non-face-to-face time during this encounter.  Penni Homans, MD

## 2020-09-30 NOTE — Assessment & Plan Note (Signed)
hgba1c acceptable, minimize simple carbs. Increase exercise as tolerated.  

## 2020-09-30 NOTE — Assessment & Plan Note (Signed)
Encouraged heart healthy diet, increase exercise, avoid trans fats, consider a krill oil cap daily 

## 2020-09-30 NOTE — Assessment & Plan Note (Addendum)
Monitor and report any concerns. no changes to meds. Encouraged heart healthy diet such as the DASH diet and exercise as tolerated.  

## 2020-09-30 NOTE — Assessment & Plan Note (Signed)
Supplement and monitor 

## 2020-09-30 NOTE — Assessment & Plan Note (Signed)
Mild, asymptomatic, continue to monitor 

## 2020-10-01 ENCOUNTER — Telehealth: Payer: Self-pay

## 2020-10-01 DIAGNOSIS — R21 Rash and other nonspecific skin eruption: Secondary | ICD-10-CM | POA: Diagnosis not present

## 2020-10-01 DIAGNOSIS — R059 Cough, unspecified: Secondary | ICD-10-CM | POA: Diagnosis not present

## 2020-10-01 DIAGNOSIS — I517 Cardiomegaly: Secondary | ICD-10-CM | POA: Diagnosis not present

## 2020-10-01 NOTE — Telephone Encounter (Signed)
PA initiated via Covermymeds; KEY: [id number]. Awaiting determination.

## 2020-10-01 NOTE — Assessment & Plan Note (Signed)
Had to present to the ER over the weekend with SOB, congestion, headache. She is feeling some better now. They started her on Symbicort which she just picked up today. She is encouraged to use 2 puffs twice daily and is given a refill on Albuterol to use prn. Referred to pulmonology for further evaluation.

## 2020-10-01 NOTE — Telephone Encounter (Signed)
Let patient know her insurance will not cover tussionex. She should try cherritussin

## 2020-10-01 NOTE — Telephone Encounter (Signed)
PA denied. Medication not covered under plan. No alternatives given.

## 2020-10-02 ENCOUNTER — Other Ambulatory Visit: Payer: Self-pay

## 2020-10-02 ENCOUNTER — Inpatient Hospital Stay (HOSPITAL_BASED_OUTPATIENT_CLINIC_OR_DEPARTMENT_OTHER): Payer: Medicare Other | Admitting: Hematology & Oncology

## 2020-10-02 ENCOUNTER — Inpatient Hospital Stay: Payer: Medicare Other

## 2020-10-02 VITALS — BP 111/75 | HR 102 | Temp 99.0°F | Resp 20 | Wt 196.0 lb

## 2020-10-02 DIAGNOSIS — C9002 Multiple myeloma in relapse: Secondary | ICD-10-CM

## 2020-10-02 DIAGNOSIS — J40 Bronchitis, not specified as acute or chronic: Secondary | ICD-10-CM | POA: Diagnosis not present

## 2020-10-02 DIAGNOSIS — C9000 Multiple myeloma not having achieved remission: Secondary | ICD-10-CM

## 2020-10-02 DIAGNOSIS — Z5112 Encounter for antineoplastic immunotherapy: Secondary | ICD-10-CM | POA: Diagnosis not present

## 2020-10-02 LAB — CBC WITH DIFFERENTIAL (CANCER CENTER ONLY)
Abs Immature Granulocytes: 0.02 10*3/uL (ref 0.00–0.07)
Basophils Absolute: 0 10*3/uL (ref 0.0–0.1)
Basophils Relative: 0 %
Eosinophils Absolute: 0.1 10*3/uL (ref 0.0–0.5)
Eosinophils Relative: 1 %
HCT: 38 % (ref 36.0–46.0)
Hemoglobin: 12.2 g/dL (ref 12.0–15.0)
Immature Granulocytes: 1 %
Lymphocytes Relative: 15 %
Lymphs Abs: 0.6 10*3/uL — ABNORMAL LOW (ref 0.7–4.0)
MCH: 30 pg (ref 26.0–34.0)
MCHC: 32.1 g/dL (ref 30.0–36.0)
MCV: 93.6 fL (ref 80.0–100.0)
Monocytes Absolute: 0.4 10*3/uL (ref 0.1–1.0)
Monocytes Relative: 10 %
Neutro Abs: 2.6 10*3/uL (ref 1.7–7.7)
Neutrophils Relative %: 73 %
Platelet Count: 98 10*3/uL — ABNORMAL LOW (ref 150–400)
RBC: 4.06 MIL/uL (ref 3.87–5.11)
RDW: 14.6 % (ref 11.5–15.5)
WBC Count: 3.6 10*3/uL — ABNORMAL LOW (ref 4.0–10.5)
nRBC: 0 % (ref 0.0–0.2)

## 2020-10-02 LAB — CMP (CANCER CENTER ONLY)
ALT: 16 U/L (ref 0–44)
AST: 18 U/L (ref 15–41)
Albumin: 3.5 g/dL (ref 3.5–5.0)
Alkaline Phosphatase: 49 U/L (ref 38–126)
Anion gap: 7 (ref 5–15)
BUN: 13 mg/dL (ref 8–23)
CO2: 28 mmol/L (ref 22–32)
Calcium: 9.2 mg/dL (ref 8.9–10.3)
Chloride: 104 mmol/L (ref 98–111)
Creatinine: 0.85 mg/dL (ref 0.44–1.00)
GFR, Estimated: >60 mL/min (ref >60)
Glucose, Bld: 160 mg/dL — ABNORMAL HIGH (ref 70–99)
Potassium: 3.7 mmol/L (ref 3.5–5.1)
Sodium: 139 mmol/L (ref 135–145)
Total Bilirubin: 0.6 mg/dL (ref 0.3–1.2)
Total Protein: 7.6 g/dL (ref 6.5–8.1)

## 2020-10-02 MED ORDER — DEXTROSE 5 % IV SOLN
2.0000 g | Freq: Once | INTRAVENOUS | Status: AC
  Administered 2020-10-02: 2 g via INTRAVENOUS
  Filled 2020-10-02: qty 20

## 2020-10-02 MED ORDER — ACETAMINOPHEN 325 MG PO TABS
ORAL_TABLET | ORAL | Status: AC
  Filled 2020-10-02: qty 2

## 2020-10-02 MED ORDER — DEXAMETHASONE 4 MG PO TABS
20.0000 mg | ORAL_TABLET | Freq: Once | ORAL | Status: AC
  Administered 2020-10-02: 20 mg via ORAL

## 2020-10-02 MED ORDER — DIPHENHYDRAMINE HCL 25 MG PO CAPS
50.0000 mg | ORAL_CAPSULE | Freq: Once | ORAL | Status: AC
  Administered 2020-10-02: 50 mg via ORAL

## 2020-10-02 MED ORDER — DIPHENHYDRAMINE HCL 25 MG PO CAPS
ORAL_CAPSULE | ORAL | Status: AC
  Filled 2020-10-02: qty 2

## 2020-10-02 MED ORDER — DEXAMETHASONE 4 MG PO TABS
ORAL_TABLET | ORAL | Status: AC
  Filled 2020-10-02: qty 5

## 2020-10-02 MED ORDER — DARATUMUMAB-HYALURONIDASE-FIHJ 1800-30000 MG-UT/15ML ~~LOC~~ SOLN
1800.0000 mg | Freq: Once | SUBCUTANEOUS | Status: AC
  Administered 2020-10-02: 1800 mg via SUBCUTANEOUS
  Filled 2020-10-02: qty 15

## 2020-10-02 MED ORDER — SODIUM CHLORIDE 0.9 % IV SOLN
INTRAVENOUS | Status: DC
  Filled 2020-10-02: qty 250

## 2020-10-02 MED ORDER — ACETAMINOPHEN 325 MG PO TABS
650.0000 mg | ORAL_TABLET | Freq: Once | ORAL | Status: AC
  Administered 2020-10-02: 650 mg via ORAL

## 2020-10-02 NOTE — Telephone Encounter (Signed)
Left message on machine on 09/20/20

## 2020-10-02 NOTE — Patient Instructions (Signed)
Daratumumab injection What is this medicine? DARATUMUMAB (dar a toom ue mab) is a monoclonal antibody. It is used to treat multiple myeloma. This medicine may be used for other purposes; ask your health care provider or pharmacist if you have questions. COMMON BRAND NAME(S): DARZALEX What should I tell my health care provider before I take this medicine? They need to know if you have any of these conditions:  infection (especially a virus infection such as chickenpox, herpes, or hepatitis B virus)  lung or breathing disease  an unusual or allergic reaction to daratumumab, other medicines, foods, dyes, or preservatives  pregnant or trying to get pregnant  breast-feeding How should I use this medicine? This medicine is for infusion into a vein. It is given by a health care professional in a hospital or clinic setting. Talk to your pediatrician regarding the use of this medicine in children. Special care may be needed. Overdosage: If you think you have taken too much of this medicine contact a poison control center or emergency room at once. NOTE: This medicine is only for you. Do not share this medicine with others. What if I miss a dose? Keep appointments for follow-up doses as directed. It is important not to miss your dose. Call your doctor or health care professional if you are unable to keep an appointment. What may interact with this medicine? Interactions have not been studied. This list may not describe all possible interactions. Give your health care provider a list of all the medicines, herbs, non-prescription drugs, or dietary supplements you use. Also tell them if you smoke, drink alcohol, or use illegal drugs. Some items may interact with your medicine. What should I watch for while using this medicine? This drug may make you feel generally unwell. Report any side effects. Continue your course of treatment even though you feel ill unless your doctor tells you to stop. This  medicine can cause serious allergic reactions. To reduce your risk you may need to take medicine before treatment with this medicine. Take your medicine as directed. This medicine can affect the results of blood tests to match your blood type. These changes can last for up to 6 months after the final dose. Your healthcare provider will do blood tests to match your blood type before you start treatment. Tell all of your healthcare providers that you are being treated with this medicine before receiving a blood transfusion. This medicine can affect the results of some tests used to determine treatment response; extra tests may be needed to evaluate response. Do not become pregnant while taking this medicine or for 3 months after stopping it. Women should inform their doctor if they wish to become pregnant or think they might be pregnant. There is a potential for serious side effects to an unborn child. Talk to your health care professional or pharmacist for more information. What side effects may I notice from receiving this medicine? Side effects that you should report to your doctor or health care professional as soon as possible:  allergic reactions like skin rash, itching or hives, swelling of the face, lips, or tongue  breathing problems  chills  cough  dizziness  feeling faint or lightheaded  headache  low blood counts - this medicine may decrease the number of white blood cells, red blood cells and platelets. You may be at increased risk for infections and bleeding.  nausea, vomiting  shortness of breath  signs of decreased platelets or bleeding - bruising, pinpoint red spots on  the skin, black, tarry stools, blood in the urine  signs of decreased red blood cells - unusually weak or tired, feeling faint or lightheaded, falls  signs of infection - fever or chills, cough, sore throat, pain or difficulty passing urine  signs and symptoms of liver injury like dark yellow or brown  urine; general ill feeling or flu-like symptoms; light-colored stools; loss of appetite; right upper belly pain; unusually weak or tired; yellowing of the eyes or skin Side effects that usually do not require medical attention (report to your doctor or health care professional if they continue or are bothersome):  back pain  constipation  diarrhea  joint pain  muscle cramps  pain, tingling, numbness in the hands or feet  swelling of the ankles, feet, hands  tiredness  trouble sleeping This list may not describe all possible side effects. Call your doctor for medical advice about side effects. You may report side effects to FDA at 1-800-FDA-1088. Where should I keep my medicine? This drug is given in a hospital or clinic and will not be stored at home. NOTE: This sheet is a summary. It may not cover all possible information. If you have questions about this medicine, talk to your doctor, pharmacist, or health care provider.  2020 Elsevier/Gold Standard (2019-07-04 18:10:54) Ceftriaxone Injection What is this medicine? CEFTRIAXONE (sef try AX one) is a cephalosporin antibiotic. It treats some infections caused by bacteria. It will not work for colds, the flu, or other viruses. This medicine may be used for other purposes; ask your health care provider or pharmacist if you have questions. COMMON BRAND NAME(S): Ceftrisol Plus, Rocephin What should I tell my health care provider before I take this medicine? They need to know if you have any of these conditions:  any chronic illness  bowel disease, like colitis  both kidney and liver disease  high bilirubin level in newborn patients  an unusual or allergic reaction to ceftriaxone, other cephalosporin or penicillin antibiotics, foods, dyes, or preservatives  pregnant or trying to get pregnant  breast-feeding How should I use this medicine? This drug is injected into a muscle or a vein. It is usually given by a health care  provider in a hospital or clinic setting. If you get this drug at home, you will be taught how to prepare and give it. Use exactly as directed. Take it as directed on the prescription label at the same time every day. Keep taking it unless your health care provider tells you to stop. It is important that you put your used needles and syringes in a special sharps container. Do not put them in a trash can. If you do not have a sharps container, call your pharmacist or health care provider to get one. Talk to your health care provider about the use of this drug in children. While it may be prescribed for children as young as newborns for selected conditions, precautions do apply. Overdosage: If you think you have taken too much of this medicine contact a poison control center or emergency room at once. NOTE: This medicine is only for you. Do not share this medicine with others. What if I miss a dose? It is important not to miss your dose. Call your health care provider if you are unable to keep an appointment. If you give yourself this drug at home and you miss a dose, take it as soon as you can. If it is almost time for your next dose, take only that dose.  Do not take double or extra doses. What may interact with this medicine? Do not take this medicine with any of the following medications:  intravenous calcium This medicine may also interact with the following medications:  birth control pills This list may not describe all possible interactions. Give your health care provider a list of all the medicines, herbs, non-prescription drugs, or dietary supplements you use. Also tell them if you smoke, drink alcohol, or use illegal drugs. Some items may interact with your medicine. What should I watch for while using this medicine? Tell your doctor or health care provider if your symptoms do not improve or if they get worse. This medicine may cause serious skin reactions. They can happen weeks to months  after starting the medicine. Contact your health care provider right away if you notice fevers or flu-like symptoms with a rash. The rash may be red or purple and then turn into blisters or peeling of the skin. Or, you might notice a red rash with swelling of the face, lips or lymph nodes in your neck or under your arms. Do not treat diarrhea with over the counter products. Contact your doctor if you have diarrhea that lasts more than 2 days or if it is severe and watery. If you are being treated for a sexually transmitted disease, avoid sexual contact until you have finished your treatment. Having sex can infect your sexual partner. Calcium may bind to this medicine and cause lung or kidney problems. Avoid calcium products while taking this medicine and for 48 hours after taking the last dose of this medicine. What side effects may I notice from receiving this medicine? Side effects that you should report to your doctor or health care professional as soon as possible:  allergic reactions like skin rash, itching or hives, swelling of the face, lips, or tongue  breathing problems  fever, chills  irregular heartbeat  pain when passing urine  redness, blistering, peeling, or loosening of the skin, including inside the mouth  seizures  stomach pain, cramps  unusual bleeding, bruising  unusually weak or tired Side effects that usually do not require medical attention (report to your doctor or health care professional if they continue or are bothersome):  diarrhea  dizzy, drowsy  headache  nausea, vomiting  pain, swelling, irritation where injected  stomach upset  sweating This list may not describe all possible side effects. Call your doctor for medical advice about side effects. You may report side effects to FDA at 1-800-FDA-1088. Where should I keep my medicine? Keep out of the reach of children and pets. You will be instructed on how to store this drug. Protect from light.  Throw away any unused drug after the expiration date. NOTE: This sheet is a summary. It may not cover all possible information. If you have questions about this medicine, talk to your doctor, pharmacist, or health care provider.  2020 Elsevier/Gold Standard (2019-06-01 18:29:21)

## 2020-10-02 NOTE — Progress Notes (Signed)
Hematology and Oncology Follow Up Visit  Tami [name] [id number] 06-04-1956 64 y.o. 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  She is doing well though she does have a bout of bronchitis.  She saw her family doctor.  He gave her some cough medication.  Gave her an inhaler.  She is not taking any antibiotic yet.  He did go ahead and give her prescription for doxycycline.  She has not yet taken this.  I will going give her a dose of Rocephin in the office.  She otherwise has been doing pretty well.  Her son had a very nice wedding since last time we saw her.  She is not had problems with her back.  She has had no change in bowel or bladder habits.  She has had no fever.  She has had no bleeding.  Is been no leg swelling.  Only last checked her myeloma studies back in mid October, her M spike was 1.6 g/dL.  The IgG level was 2680 mg/dL.  The kappa light chain was 7.7 mg/dL.  She did have a nice Thanksgiving more.  She can start on her collard greens tonight.    Currently, her performance status is ECOG 1.  Medications:  Allergies as of 10/02/2020      Reactions   Benazepril Anaphylaxis, Swelling, Hives   angioedema Throat and lip swelling   Ondansetron Hcl Hives   Redness and hives post IV admin on 07/05/17   Codeine Nausea And Vomiting   Morphine Hives   Redness and hives noted post IV admin on 07/05/17      Medication List       Accurate as of October 02, 2020  8:09 AM. If you have any questions, ask your nurse or doctor.        acetaminophen 500 MG tablet Commonly known as: TYLENOL Take 500 mg by  mouth every 6 (six) hours as needed.   ACETAMINOPHEN-BUTALBITAL 50-325 MG Tabs Take 1 tablet by mouth 3 (three) times daily as needed.   acyclovir 400 MG tablet Commonly known as: ZOVIRAX Take 1 tablet (400 mg total) by mouth 2 (two) times daily.   albuterol 108 (90 Base) MCG/ACT inhaler Commonly known as: VENTOLIN HFA Inhale 2 puffs into the lungs every 6 (six) hours as needed for wheezing or shortness of breath.   aspirin EC 81 MG tablet Take 1 tablet (81 mg total) by mouth daily.   budesonide-formoterol 160-4.5 MCG/ACT inhaler Commonly known as: SYMBICORT Inhale 2 puffs into the lungs 2 (two) times daily.   carvedilol 25 MG tablet Commonly known as: COREG Take 1 tablet (25 mg total) by mouth 2 (two) times daily with a meal.   chlorpheniramine-HYDROcodone 10-8 MG/5ML Suer Commonly known as: Tussionex Pennkinetic ER Take 5 mLs by mouth every 12 (twelve) hours as needed for cough.   cyclobenzaprine 10 MG tablet Commonly known as: FLEXERIL Take 1 tablet (10 mg total) by mouth at bedtime.   dexamethasone 4 MG tablet Commonly known as: DECADRON Take 1 tablet (4 mg total) by mouth daily. Take for 2 days starting the night  of chemotherapy.   fluticasone 50 MCG/ACT nasal spray Commonly known as: FLONASE Place 2 sprays into both nostrils daily.   furosemide 20 MG tablet Commonly known as: LASIX Take 1 tablet by mouth as directed.   guaiFENesin 600 MG 12 hr tablet Commonly known as: MUCINEX Take 600 mg by mouth 2 (two) times daily.   losartan 25 MG tablet Commonly known as: COZAAR Take 25 mg by mouth. Take two tablets, total of 50 mg daily   magnesium oxide 400 MG tablet Commonly known as: MAG-OX Take 1 tablet (400 mg total) by mouth 2 (two) times daily.   meclizine 12.5 MG tablet Commonly known as: ANTIVERT Take 12.5 mg by mouth 3 (three) times daily.   melatonin 3 MG Tabs tablet Take 3 mg by mouth at bedtime.   methylPREDNISolone 4 MG Tbpk tablet Commonly  known as: MEDROL DOSEPAK Take per package instructions   montelukast 10 MG tablet Commonly known as: Singulair Take 1 tablet (10 mg total) by mouth at bedtime. Start taking Singulair on 11/16/2019.   MULTIVITAMIN ADULT PO Take 1 tablet by mouth daily.   NON FORMULARY Take 1 capsule by mouth daily. Colon Health Daily Probiotic 4- in - one   ondansetron 8 MG tablet Commonly known as: Zofran Take 1 tablet (8 mg total) by mouth 2 (two) times daily as needed (Nausea or vomiting).   pantoprazole 40 MG tablet Commonly known as: PROTONIX Take 1 tablet (40 mg total) by mouth 2 (two) times daily.   Premarin vaginal cream Generic drug: conjugated estrogens Place 1 Applicatorful vaginally as needed.   prochlorperazine 10 MG tablet Commonly known as: COMPAZINE Take 1 tablet (10 mg total) by mouth every 6 (six) hours as needed (Nausea or vomiting).   promethazine 25 MG tablet Commonly known as: PHENERGAN Take 1 tablet (25 mg total) by mouth every 6 (six) hours as needed for nausea or vomiting.   spironolactone 25 MG tablet Commonly known as: ALDACTONE Take 1 tablet by mouth daily.   sucralfate 1 g tablet Commonly known as: Carafate Take 1 tablet (1 g total) by mouth 3 (three) times daily as needed. prn   topiramate 50 MG tablet Commonly known as: Topamax Take 1 tablet (50 mg total) by mouth 2 (two) times daily.   VITAMIN D PO Take 1,000 Units by mouth daily.   Vitamin D-3 25 MCG (1000 UT) Caps Take 1 capsule (1,000 Units total) by mouth daily.       Allergies:  Allergies  Allergen Reactions  . Benazepril Anaphylaxis, Swelling and Hives    angioedema Throat and lip swelling  . Ondansetron Hcl Hives    Redness and hives post IV admin on 07/05/17  . Codeine Nausea And Vomiting  . Morphine Hives    Redness and hives noted post IV admin on 07/05/17    Past Medical History, Surgical history, Social history, and Family History were reviewed and updated.  Review of  Systems: Review of Systems  Constitutional: Negative.   HENT: Negative.   Eyes: Negative.   Respiratory: Negative.   Cardiovascular: Negative.   Gastrointestinal: Negative.   Genitourinary: Negative.   Musculoskeletal: Negative.   Skin: Negative.   Neurological: Negative.   Endo/Heme/Allergies: Negative.   Psychiatric/Behavioral: Negative.       Physical Exam:  vitals were not taken for this visit.   Wt Readings from Last 3 Encounters:  09/17/20 197 lb (89.4 kg)  08/26/20 195 lb (88.5 kg)  08/23/20 195 lb 1.7 oz (88.5 kg)  Physical Exam Vitals reviewed.  HENT:     Head: Normocephalic and atraumatic.  Eyes:     Pupils: Pupils are equal, round, and reactive to light.  Cardiovascular:     Rate and Rhythm: Normal rate and regular rhythm.     Heart sounds: Normal heart sounds.  Pulmonary:     Effort: Pulmonary effort is normal.     Breath sounds: Normal breath sounds.  Abdominal:     General: Bowel sounds are normal.     Palpations: Abdomen is soft.  Musculoskeletal:        General: No tenderness or deformity. Normal range of motion.     Cervical back: Normal range of motion.  Lymphadenopathy:     Cervical: No cervical adenopathy.  Skin:    General: Skin is warm and dry.     Findings: No erythema or rash.  Neurological:     Mental Status: She is alert and oriented to person, place, and time.  Psychiatric:        Behavior: Behavior normal.        Thought Content: Thought content normal.        Judgment: Judgment normal.      Lab Results  Component Value Date   WBC 3.6 (L) 10/02/2020   HGB 12.2 10/02/2020   HCT 38.0 10/02/2020   MCV 93.6 10/02/2020   PLT 98 (L) 10/02/2020   Lab Results  Component Value Date   FERRITIN 2,483 (H) 08/26/2020   IRON 124 08/26/2020   TIBC 284 08/26/2020   UIBC 160 08/26/2020   IRONPCTSAT 44 08/26/2020   Lab Results  Component Value Date   RETICCTPCT 0.8 07/16/2016   RBC 4.06 10/02/2020   RETICCTABS 32,640  07/16/2016   Lab Results  Component Value Date   KPAFRELGTCHN 76.6 (H) 08/26/2020   LAMBDASER 4.8 (L) 08/26/2020   KAPLAMBRATIO 15.96 (H) 08/26/2020   Lab Results  Component Value Date   IGGSERUM 2,623 (H) 08/26/2020   IGGSERUM 2,720 (H) 08/26/2020   IGA 130 08/26/2020   IGA 127 08/26/2020   IGMSERUM 111 08/26/2020   IGMSERUM 109 08/26/2020   Lab Results  Component Value Date   TOTALPROTELP 8.1 08/26/2020   ALBUMINELP 3.5 08/26/2020   A1GS 0.2 08/26/2020   A2GS 1.0 08/26/2020   BETS 1.1 08/26/2020   BETA2SER 0.5 07/16/2016   GAMS 2.3 (H) 08/26/2020   MSPIKE 1.6 (H) 08/26/2020   SPEI Comment 12/07/2019     Chemistry      Component Value Date/Time   NA 140 09/25/2020 0919   NA 143 11/08/2017 1127   NA 140 12/31/2016 1000   K 3.8 09/25/2020 0919   K 4.0 11/08/2017 1127   K 3.6 12/31/2016 1000   CL 107 09/25/2020 0919   CL 107 11/08/2017 1127   CO2 24 09/25/2020 0919   CO2 25 11/08/2017 1127   CO2 24 12/31/2016 1000   BUN 20 09/25/2020 0919   BUN 19 11/08/2017 1127   BUN 22.3 12/31/2016 1000   CREATININE 0.67 09/25/2020 0919   CREATININE 0.9 11/08/2017 1127   CREATININE 0.8 12/31/2016 1000      Component Value Date/Time   CALCIUM 9.7 09/25/2020 0919   CALCIUM 9.9 11/08/2017 1127   CALCIUM 9.5 12/31/2016 1000   ALKPHOS 67 09/25/2020 0919   ALKPHOS 44 11/08/2017 1127   ALKPHOS 51 12/31/2016 1000   AST 15 09/25/2020 0919   AST 28 12/31/2016 1000   ALT 19 09/25/2020 0919   ALT 36 11/08/2017  1127   ALT 16 12/31/2016 1000   BILITOT 0.5 09/25/2020 0919   BILITOT 0.46 12/31/2016 1000       Impression and Plan: Ms. Vandekamp is a very pleasant 64 yo African American female with a previous IgG kappa MGUS with progression to myeloma.  He currently is on Faspro.  Hopefully, she is responding to the [employer].  We will see what her myeloma studies show today.  Again we will give her a dose of Rocephin.  I think this will be helpful.  We will plan to get her back to  see [place] in another 3 to 4 weeks.      [name], MD 11/24/20218:09 AM

## 2020-10-02 NOTE — Progress Notes (Signed)
Ok to treat with platelets of 98 per Dr Marin Olp. dph

## 2020-10-02 NOTE — Progress Notes (Signed)
Okay to treat today with increased HR per Dr. Marin Olp.

## 2020-10-03 LAB — IGG, IGA, IGM
IgA: 65 mg/dL — ABNORMAL LOW (ref 87–352)
IgG (Immunoglobin G), Serum: 2107 mg/dL — ABNORMAL HIGH (ref 586–1602)
IgM (Immunoglobulin M), Srm: 71 mg/dL (ref 26–217)

## 2020-10-04 LAB — KAPPA/LAMBDA LIGHT CHAINS
Kappa free light chain: 35 mg/L — ABNORMAL HIGH (ref 3.3–19.4)
Kappa, lambda light chain ratio: 14.58 — ABNORMAL HIGH (ref 0.26–1.65)
Lambda free light chains: 2.4 mg/L — ABNORMAL LOW (ref 5.7–26.3)

## 2020-10-07 DIAGNOSIS — G43809 Other migraine, not intractable, without status migrainosus: Secondary | ICD-10-CM | POA: Diagnosis not present

## 2020-10-07 DIAGNOSIS — R519 Headache, unspecified: Secondary | ICD-10-CM | POA: Diagnosis not present

## 2020-10-07 LAB — PROTEIN ELECTROPHORESIS, SERUM, WITH REFLEX
A/G Ratio: 0.8 (ref 0.7–1.7)
Albumin ELP: 3 g/dL (ref 2.9–4.4)
Alpha-1-Globulin: 0.2 g/dL (ref 0.0–0.4)
Alpha-2-Globulin: 1 g/dL (ref 0.4–1.0)
Beta Globulin: 0.9 g/dL (ref 0.7–1.3)
Gamma Globulin: 1.8 g/dL (ref 0.4–1.8)
Globulin, Total: 4 g/dL — ABNORMAL HIGH (ref 2.2–3.9)
M-Spike, %: 1.2 g/dL — ABNORMAL HIGH
SPEP Interpretation: 0
Total Protein ELP: 7 g/dL (ref 6.0–8.5)

## 2020-10-07 LAB — IMMUNOFIXATION REFLEX, SERUM
IgA: 76 mg/dL — ABNORMAL LOW (ref 87–352)
IgG (Immunoglobin G), Serum: 2695 mg/dL — ABNORMAL HIGH (ref 586–1602)
IgM (Immunoglobulin M), Srm: 87 mg/dL (ref 26–217)

## 2020-10-08 ENCOUNTER — Telehealth: Payer: Self-pay

## 2020-10-08 DIAGNOSIS — J219 Acute bronchiolitis, unspecified: Secondary | ICD-10-CM | POA: Diagnosis not present

## 2020-10-08 DIAGNOSIS — R0981 Nasal congestion: Secondary | ICD-10-CM | POA: Diagnosis not present

## 2020-10-08 DIAGNOSIS — I10 Essential (primary) hypertension: Secondary | ICD-10-CM | POA: Diagnosis not present

## 2020-10-08 DIAGNOSIS — C9000 Multiple myeloma not having achieved remission: Secondary | ICD-10-CM | POA: Diagnosis not present

## 2020-10-08 DIAGNOSIS — R058 Other specified cough: Secondary | ICD-10-CM | POA: Diagnosis not present

## 2020-10-08 DIAGNOSIS — R7303 Prediabetes: Secondary | ICD-10-CM | POA: Diagnosis not present

## 2020-10-08 DIAGNOSIS — G4733 Obstructive sleep apnea (adult) (pediatric): Secondary | ICD-10-CM | POA: Diagnosis not present

## 2020-10-08 NOTE — Telephone Encounter (Signed)
Pt called in to check appt time for 12/1 and weekly tx, in error I had not added her.  Appts have been added and she is aware of her appts for 12/1, message also sent to [facility] in the pharmacy,,,,,,, AOM

## 2020-10-09 ENCOUNTER — Inpatient Hospital Stay: Payer: Medicare Other

## 2020-10-09 ENCOUNTER — Other Ambulatory Visit: Payer: Self-pay | Admitting: *Deleted

## 2020-10-09 ENCOUNTER — Other Ambulatory Visit: Payer: Self-pay

## 2020-10-09 ENCOUNTER — Inpatient Hospital Stay: Payer: Medicare Other | Attending: Hematology & Oncology

## 2020-10-09 VITALS — BP 121/70 | HR 90 | Temp 98.6°F | Resp 18

## 2020-10-09 DIAGNOSIS — C9002 Multiple myeloma in relapse: Secondary | ICD-10-CM

## 2020-10-09 DIAGNOSIS — C9000 Multiple myeloma not having achieved remission: Secondary | ICD-10-CM

## 2020-10-09 DIAGNOSIS — Z5112 Encounter for antineoplastic immunotherapy: Secondary | ICD-10-CM | POA: Insufficient documentation

## 2020-10-09 LAB — CMP (CANCER CENTER ONLY)
ALT: 18 U/L (ref 0–44)
AST: 16 U/L (ref 15–41)
Albumin: 3.6 g/dL (ref 3.5–5.0)
Alkaline Phosphatase: 50 U/L (ref 38–126)
Anion gap: 8 (ref 5–15)
BUN: 19 mg/dL (ref 8–23)
CO2: 25 mmol/L (ref 22–32)
Calcium: 9.3 mg/dL (ref 8.9–10.3)
Chloride: 107 mmol/L (ref 98–111)
Creatinine: 0.71 mg/dL (ref 0.44–1.00)
GFR, Estimated: >60 mL/min (ref >60)
Glucose, Bld: 125 mg/dL — ABNORMAL HIGH (ref 70–99)
Potassium: 3.4 mmol/L — ABNORMAL LOW (ref 3.5–5.1)
Sodium: 140 mmol/L (ref 135–145)
Total Bilirubin: 0.5 mg/dL (ref 0.3–1.2)
Total Protein: 7.9 g/dL (ref 6.5–8.1)

## 2020-10-09 LAB — CBC WITH DIFFERENTIAL (CANCER CENTER ONLY)
Abs Immature Granulocytes: 0.02 10*3/uL (ref 0.00–0.07)
Basophils Absolute: 0 10*3/uL (ref 0.0–0.1)
Basophils Relative: 0 %
Eosinophils Absolute: 0 10*3/uL (ref 0.0–0.5)
Eosinophils Relative: 1 %
HCT: 38.9 % (ref 36.0–46.0)
Hemoglobin: 12.6 g/dL (ref 12.0–15.0)
Immature Granulocytes: 1 %
Lymphocytes Relative: 30 %
Lymphs Abs: 1.3 10*3/uL (ref 0.7–4.0)
MCH: 30.1 pg (ref 26.0–34.0)
MCHC: 32.4 g/dL (ref 30.0–36.0)
MCV: 93.1 fL (ref 80.0–100.0)
Monocytes Absolute: 0.3 10*3/uL (ref 0.1–1.0)
Monocytes Relative: 7 %
Neutro Abs: 2.6 10*3/uL (ref 1.7–7.7)
Neutrophils Relative %: 61 %
Platelet Count: 145 10*3/uL — ABNORMAL LOW (ref 150–400)
RBC: 4.18 MIL/uL (ref 3.87–5.11)
RDW: 13.6 % (ref 11.5–15.5)
WBC Count: 4.2 10*3/uL (ref 4.0–10.5)
nRBC: 0 % (ref 0.0–0.2)

## 2020-10-09 MED ORDER — DIPHENHYDRAMINE HCL 25 MG PO CAPS
50.0000 mg | ORAL_CAPSULE | Freq: Once | ORAL | Status: AC
  Administered 2020-10-09: 50 mg via ORAL

## 2020-10-09 MED ORDER — DIPHENHYDRAMINE HCL 25 MG PO CAPS
ORAL_CAPSULE | ORAL | Status: AC
  Filled 2020-10-09: qty 1

## 2020-10-09 MED ORDER — DEXAMETHASONE 4 MG PO TABS
ORAL_TABLET | ORAL | Status: AC
  Filled 2020-10-09: qty 5

## 2020-10-09 MED ORDER — DARATUMUMAB-HYALURONIDASE-FIHJ 1800-30000 MG-UT/15ML ~~LOC~~ SOLN
1800.0000 mg | Freq: Once | SUBCUTANEOUS | Status: AC
  Administered 2020-10-09: 1800 mg via SUBCUTANEOUS
  Filled 2020-10-09: qty 15

## 2020-10-09 MED ORDER — ACETAMINOPHEN 325 MG PO TABS
ORAL_TABLET | ORAL | Status: AC
  Filled 2020-10-09: qty 2

## 2020-10-09 MED ORDER — DEXAMETHASONE 4 MG PO TABS
20.0000 mg | ORAL_TABLET | Freq: Once | ORAL | Status: AC
  Administered 2020-10-09: 20 mg via ORAL

## 2020-10-09 MED ORDER — ACETAMINOPHEN 325 MG PO TABS
650.0000 mg | ORAL_TABLET | Freq: Once | ORAL | Status: AC
  Administered 2020-10-09: 650 mg via ORAL

## 2020-10-09 NOTE — Patient Instructions (Addendum)
Daratumumab injection What is this medicine? DARATUMUMAB (dar a toom ue mab) is a monoclonal antibody. It is used to treat multiple myeloma. This medicine may be used for other purposes; ask your health care provider or pharmacist if you have questions. COMMON BRAND NAME(S): DARZALEX What should I tell my health care provider before I take this medicine? They need to know if you have any of these conditions:  infection (especially a virus infection such as chickenpox, herpes, or hepatitis B virus)  lung or breathing disease  an unusual or allergic reaction to daratumumab, other medicines, foods, dyes, or preservatives  pregnant or trying to get pregnant  breast-feeding How should I use this medicine? This medicine is for infusion into a vein. It is given by a health care professional in a hospital or clinic setting. Talk to your pediatrician regarding the use of this medicine in children. Special care may be needed. Overdosage: If you think you have taken too much of this medicine contact a poison control center or emergency room at once. NOTE: This medicine is only for you. Do not share this medicine with others. What if I miss a dose? Keep appointments for follow-up doses as directed. It is important not to miss your dose. Call your doctor or health care professional if you are unable to keep an appointment. What may interact with this medicine? Interactions have not been studied. This list may not describe all possible interactions. Give your health care provider a list of all the medicines, herbs, non-prescription drugs, or dietary supplements you use. Also tell them if you smoke, drink alcohol, or use illegal drugs. Some items may interact with your medicine. What should I watch for while using this medicine? This drug may make you feel generally unwell. Report any side effects. Continue your course of treatment even though you feel ill unless your doctor tells you to stop. This  medicine can cause serious allergic reactions. To reduce your risk you may need to take medicine before treatment with this medicine. Take your medicine as directed. This medicine can affect the results of blood tests to match your blood type. These changes can last for up to 6 months after the final dose. Your healthcare provider will do blood tests to match your blood type before you start treatment. Tell all of your healthcare providers that you are being treated with this medicine before receiving a blood transfusion. This medicine can affect the results of some tests used to determine treatment response; extra tests may be needed to evaluate response. Do not become pregnant while taking this medicine or for 3 months after stopping it. Women should inform their doctor if they wish to become pregnant or think they might be pregnant. There is a potential for serious side effects to an unborn child. Talk to your health care professional or pharmacist for more information. What side effects may I notice from receiving this medicine? Side effects that you should report to your doctor or health care professional as soon as possible:  allergic reactions like skin rash, itching or hives, swelling of the face, lips, or tongue  breathing problems  chills  cough  dizziness  feeling faint or lightheaded  headache  low blood counts - this medicine may decrease the number of white blood cells, red blood cells and platelets. You may be at increased risk for infections and bleeding.  nausea, vomiting  shortness of breath  signs of decreased platelets or bleeding - bruising, pinpoint red spots on  the skin, black, tarry stools, blood in the urine  signs of decreased red blood cells - unusually weak or tired, feeling faint or lightheaded, falls  signs of infection - fever or chills, cough, sore throat, pain or difficulty passing urine  signs and symptoms of liver injury like dark yellow or brown  urine; general ill feeling or flu-like symptoms; light-colored stools; loss of appetite; right upper belly pain; unusually weak or tired; yellowing of the eyes or skin Side effects that usually do not require medical attention (report to your doctor or health care professional if they continue or are bothersome):  back pain  constipation  diarrhea  joint pain  muscle cramps  pain, tingling, numbness in the hands or feet  swelling of the ankles, feet, hands  tiredness  trouble sleeping This list may not describe all possible side effects. Call your doctor for medical advice about side effects. You may report side effects to FDA at 1-800-FDA-1088. Where should I keep my medicine? This drug is given in a hospital or clinic and will not be stored at home. NOTE: This sheet is a summary. It may not cover all possible information. If you have questions about this medicine, talk to your doctor, pharmacist, or health care provider.  2020 Elsevier/Gold Standard (2019-07-04 18:10:54) Pt discharged in no apparent distress. Pt left ambulatory without assistance. Pt aware of discharge instructions and verbalized understanding and had no further questions.

## 2020-10-12 DIAGNOSIS — G43809 Other migraine, not intractable, without status migrainosus: Secondary | ICD-10-CM | POA: Diagnosis not present

## 2020-10-14 DIAGNOSIS — R918 Other nonspecific abnormal finding of lung field: Secondary | ICD-10-CM | POA: Diagnosis not present

## 2020-10-14 DIAGNOSIS — I517 Cardiomegaly: Secondary | ICD-10-CM | POA: Diagnosis not present

## 2020-10-14 DIAGNOSIS — R519 Headache, unspecified: Secondary | ICD-10-CM | POA: Diagnosis not present

## 2020-10-14 DIAGNOSIS — R059 Cough, unspecified: Secondary | ICD-10-CM | POA: Diagnosis not present

## 2020-10-16 ENCOUNTER — Inpatient Hospital Stay: Payer: Medicare Other

## 2020-10-16 DIAGNOSIS — I110 Hypertensive heart disease with heart failure: Secondary | ICD-10-CM | POA: Diagnosis not present

## 2020-10-16 DIAGNOSIS — G4733 Obstructive sleep apnea (adult) (pediatric): Secondary | ICD-10-CM | POA: Diagnosis not present

## 2020-10-16 DIAGNOSIS — C9000 Multiple myeloma not having achieved remission: Secondary | ICD-10-CM | POA: Diagnosis not present

## 2020-10-16 DIAGNOSIS — H6502 Acute serous otitis media, left ear: Secondary | ICD-10-CM | POA: Diagnosis not present

## 2020-10-16 DIAGNOSIS — I5040 Unspecified combined systolic (congestive) and diastolic (congestive) heart failure: Secondary | ICD-10-CM | POA: Diagnosis not present

## 2020-10-16 DIAGNOSIS — H6692 Otitis media, unspecified, left ear: Secondary | ICD-10-CM | POA: Diagnosis not present

## 2020-10-16 DIAGNOSIS — J811 Chronic pulmonary edema: Secondary | ICD-10-CM | POA: Diagnosis not present

## 2020-10-16 DIAGNOSIS — R197 Diarrhea, unspecified: Secondary | ICD-10-CM | POA: Diagnosis not present

## 2020-10-16 DIAGNOSIS — K219 Gastro-esophageal reflux disease without esophagitis: Secondary | ICD-10-CM | POA: Diagnosis not present

## 2020-10-16 DIAGNOSIS — Z9989 Dependence on other enabling machines and devices: Secondary | ICD-10-CM | POA: Diagnosis not present

## 2020-10-16 DIAGNOSIS — J329 Chronic sinusitis, unspecified: Secondary | ICD-10-CM | POA: Diagnosis not present

## 2020-10-16 DIAGNOSIS — Z79899 Other long term (current) drug therapy: Secondary | ICD-10-CM | POA: Diagnosis not present

## 2020-10-16 DIAGNOSIS — F32A Depression, unspecified: Secondary | ICD-10-CM | POA: Diagnosis not present

## 2020-10-16 DIAGNOSIS — I5042 Chronic combined systolic (congestive) and diastolic (congestive) heart failure: Secondary | ICD-10-CM | POA: Diagnosis not present

## 2020-10-16 DIAGNOSIS — E785 Hyperlipidemia, unspecified: Secondary | ICD-10-CM | POA: Diagnosis not present

## 2020-10-16 DIAGNOSIS — Z7982 Long term (current) use of aspirin: Secondary | ICD-10-CM | POA: Diagnosis not present

## 2020-10-16 DIAGNOSIS — H903 Sensorineural hearing loss, bilateral: Secondary | ICD-10-CM | POA: Diagnosis not present

## 2020-10-16 DIAGNOSIS — I712 Thoracic aortic aneurysm, without rupture: Secondary | ICD-10-CM | POA: Diagnosis not present

## 2020-10-16 DIAGNOSIS — I517 Cardiomegaly: Secondary | ICD-10-CM | POA: Diagnosis not present

## 2020-10-16 DIAGNOSIS — Z7951 Long term (current) use of inhaled steroids: Secondary | ICD-10-CM | POA: Diagnosis not present

## 2020-10-16 DIAGNOSIS — J90 Pleural effusion, not elsewhere classified: Secondary | ICD-10-CM | POA: Diagnosis not present

## 2020-10-17 DIAGNOSIS — R197 Diarrhea, unspecified: Secondary | ICD-10-CM | POA: Diagnosis not present

## 2020-10-17 DIAGNOSIS — J019 Acute sinusitis, unspecified: Secondary | ICD-10-CM | POA: Diagnosis not present

## 2020-10-17 DIAGNOSIS — J9811 Atelectasis: Secondary | ICD-10-CM | POA: Diagnosis not present

## 2020-10-17 DIAGNOSIS — I2699 Other pulmonary embolism without acute cor pulmonale: Secondary | ICD-10-CM | POA: Diagnosis not present

## 2020-10-17 DIAGNOSIS — K219 Gastro-esophageal reflux disease without esophagitis: Secondary | ICD-10-CM | POA: Diagnosis not present

## 2020-10-17 DIAGNOSIS — I517 Cardiomegaly: Secondary | ICD-10-CM | POA: Diagnosis not present

## 2020-10-18 ENCOUNTER — Inpatient Hospital Stay: Payer: Medicare Other | Admitting: Internal Medicine

## 2020-10-18 ENCOUNTER — Ambulatory Visit: Payer: Medicare Other | Admitting: Gastroenterology

## 2020-10-18 DIAGNOSIS — J019 Acute sinusitis, unspecified: Secondary | ICD-10-CM | POA: Diagnosis not present

## 2020-10-18 DIAGNOSIS — K219 Gastro-esophageal reflux disease without esophagitis: Secondary | ICD-10-CM | POA: Diagnosis not present

## 2020-10-18 DIAGNOSIS — R197 Diarrhea, unspecified: Secondary | ICD-10-CM | POA: Diagnosis not present

## 2020-10-19 DIAGNOSIS — C9000 Multiple myeloma not having achieved remission: Secondary | ICD-10-CM | POA: Diagnosis not present

## 2020-10-19 DIAGNOSIS — I110 Hypertensive heart disease with heart failure: Secondary | ICD-10-CM | POA: Diagnosis not present

## 2020-10-19 DIAGNOSIS — I5042 Chronic combined systolic (congestive) and diastolic (congestive) heart failure: Secondary | ICD-10-CM | POA: Diagnosis not present

## 2020-10-22 ENCOUNTER — Other Ambulatory Visit: Payer: Self-pay

## 2020-10-22 DIAGNOSIS — H9209 Otalgia, unspecified ear: Secondary | ICD-10-CM | POA: Diagnosis not present

## 2020-10-22 DIAGNOSIS — G43809 Other migraine, not intractable, without status migrainosus: Secondary | ICD-10-CM | POA: Diagnosis not present

## 2020-10-22 DIAGNOSIS — C9002 Multiple myeloma in relapse: Secondary | ICD-10-CM

## 2020-10-23 ENCOUNTER — Inpatient Hospital Stay: Payer: Medicare Other

## 2020-10-23 ENCOUNTER — Other Ambulatory Visit: Payer: Self-pay

## 2020-10-23 VITALS — BP 142/90 | HR 86 | Temp 98.0°F | Resp 18

## 2020-10-23 DIAGNOSIS — C9000 Multiple myeloma not having achieved remission: Secondary | ICD-10-CM

## 2020-10-23 DIAGNOSIS — C9002 Multiple myeloma in relapse: Secondary | ICD-10-CM

## 2020-10-23 DIAGNOSIS — Z5112 Encounter for antineoplastic immunotherapy: Secondary | ICD-10-CM | POA: Diagnosis not present

## 2020-10-23 LAB — CMP (CANCER CENTER ONLY)
ALT: 15 U/L (ref 0–44)
AST: 14 U/L — ABNORMAL LOW (ref 15–41)
Albumin: 3.7 g/dL (ref 3.5–5.0)
Alkaline Phosphatase: 44 U/L (ref 38–126)
Anion gap: 7 (ref 5–15)
BUN: 23 mg/dL (ref 8–23)
CO2: 23 mmol/L (ref 22–32)
Calcium: 9.7 mg/dL (ref 8.9–10.3)
Chloride: 108 mmol/L (ref 98–111)
Creatinine: 0.67 mg/dL (ref 0.44–1.00)
GFR, Estimated: >60 mL/min (ref >60)
Glucose, Bld: 121 mg/dL — ABNORMAL HIGH (ref 70–99)
Potassium: 4.1 mmol/L (ref 3.5–5.1)
Sodium: 138 mmol/L (ref 135–145)
Total Bilirubin: 0.5 mg/dL (ref 0.3–1.2)
Total Protein: 7.4 g/dL (ref 6.5–8.1)

## 2020-10-23 LAB — CBC WITH DIFFERENTIAL (CANCER CENTER ONLY)
Abs Immature Granulocytes: 0.04 10*3/uL (ref 0.00–0.07)
Basophils Absolute: 0 10*3/uL (ref 0.0–0.1)
Basophils Relative: 0 %
Eosinophils Absolute: 0 10*3/uL (ref 0.0–0.5)
Eosinophils Relative: 0 %
HCT: 38.8 % (ref 36.0–46.0)
Hemoglobin: 12.8 g/dL (ref 12.0–15.0)
Immature Granulocytes: 1 %
Lymphocytes Relative: 6 %
Lymphs Abs: 0.5 10*3/uL — ABNORMAL LOW (ref 0.7–4.0)
MCH: 30.4 pg (ref 26.0–34.0)
MCHC: 33 g/dL (ref 30.0–36.0)
MCV: 92.2 fL (ref 80.0–100.0)
Monocytes Absolute: 0.4 10*3/uL (ref 0.1–1.0)
Monocytes Relative: 5 %
Neutro Abs: 7.1 10*3/uL (ref 1.7–7.7)
Neutrophils Relative %: 88 %
Platelet Count: 119 10*3/uL — ABNORMAL LOW (ref 150–400)
RBC: 4.21 MIL/uL (ref 3.87–5.11)
RDW: 13.6 % (ref 11.5–15.5)
WBC Count: 8 10*3/uL (ref 4.0–10.5)
nRBC: 0 % (ref 0.0–0.2)

## 2020-10-23 MED ORDER — ACETAMINOPHEN 325 MG PO TABS: 650.0000 mg | ORAL_TABLET | Freq: Once | ORAL | Status: DC

## 2020-10-23 MED ORDER — ACETAMINOPHEN 325 MG PO TABS
ORAL_TABLET | ORAL | Status: AC
  Filled 2020-10-23: qty 2

## 2020-10-23 MED ORDER — DEXAMETHASONE 4 MG PO TABS
ORAL_TABLET | ORAL | Status: AC
  Filled 2020-10-23: qty 5

## 2020-10-23 MED ORDER — DEXAMETHASONE 4 MG PO TABS: 20.0000 mg | ORAL_TABLET | Freq: Once | ORAL | Status: DC

## 2020-10-23 MED ORDER — DIPHENHYDRAMINE HCL 25 MG PO CAPS
ORAL_CAPSULE | ORAL | Status: AC
  Filled 2020-10-23: qty 2

## 2020-10-23 MED ORDER — DARATUMUMAB-HYALURONIDASE-FIHJ 1800-30000 MG-UT/15ML ~~LOC~~ SOLN: 1800.0000 mg | Freq: Once | SUBCUTANEOUS | Status: DC

## 2020-10-23 MED ORDER — DIPHENHYDRAMINE HCL 25 MG PO CAPS: 50.0000 mg | ORAL_CAPSULE | Freq: Once | ORAL | Status: DC

## 2020-10-23 NOTE — Progress Notes (Signed)
Ok to treat with BP 142/90 per Dr Marin Olp.  Patient decided not to be treated today.  Dr Marin Olp ok with not being treated.  Patient will come back next week for treatment and to see Dr Marin Olp

## 2020-10-24 ENCOUNTER — Telehealth: Payer: Self-pay | Admitting: Neurology

## 2020-10-24 DIAGNOSIS — H6691 Otitis media, unspecified, right ear: Secondary | ICD-10-CM | POA: Diagnosis not present

## 2020-10-24 DIAGNOSIS — J019 Acute sinusitis, unspecified: Secondary | ICD-10-CM | POA: Diagnosis not present

## 2020-10-24 DIAGNOSIS — R058 Other specified cough: Secondary | ICD-10-CM | POA: Diagnosis not present

## 2020-10-24 DIAGNOSIS — Z09 Encounter for follow-up examination after completed treatment for conditions other than malignant neoplasm: Secondary | ICD-10-CM | POA: Diagnosis not present

## 2020-10-24 DIAGNOSIS — C9000 Multiple myeloma not having achieved remission: Secondary | ICD-10-CM | POA: Diagnosis not present

## 2020-10-24 NOTE — Telephone Encounter (Signed)
Pa completed through cover my meds/ [employer] rx [id number] Will wait for response

## 2020-10-25 ENCOUNTER — Inpatient Hospital Stay: Payer: Medicare Other

## 2020-10-25 ENCOUNTER — Inpatient Hospital Stay: Payer: Medicare Other | Admitting: Hematology & Oncology

## 2020-10-25 DIAGNOSIS — I517 Cardiomegaly: Secondary | ICD-10-CM | POA: Diagnosis not present

## 2020-10-25 DIAGNOSIS — I493 Ventricular premature depolarization: Secondary | ICD-10-CM | POA: Diagnosis not present

## 2020-10-25 DIAGNOSIS — J90 Pleural effusion, not elsewhere classified: Secondary | ICD-10-CM | POA: Diagnosis not present

## 2020-10-25 DIAGNOSIS — I447 Left bundle-branch block, unspecified: Secondary | ICD-10-CM | POA: Diagnosis not present

## 2020-10-25 DIAGNOSIS — R7989 Other specified abnormal findings of blood chemistry: Secondary | ICD-10-CM | POA: Diagnosis not present

## 2020-10-25 DIAGNOSIS — H9201 Otalgia, right ear: Secondary | ICD-10-CM | POA: Diagnosis not present

## 2020-10-25 DIAGNOSIS — R531 Weakness: Secondary | ICD-10-CM | POA: Diagnosis not present

## 2020-10-25 DIAGNOSIS — H6121 Impacted cerumen, right ear: Secondary | ICD-10-CM | POA: Diagnosis not present

## 2020-10-28 DIAGNOSIS — R531 Weakness: Secondary | ICD-10-CM | POA: Diagnosis not present

## 2020-10-28 DIAGNOSIS — G43809 Other migraine, not intractable, without status migrainosus: Secondary | ICD-10-CM | POA: Diagnosis not present

## 2020-10-29 ENCOUNTER — Ambulatory Visit: Payer: Medicare Other | Admitting: Family Medicine

## 2020-10-30 ENCOUNTER — Inpatient Hospital Stay (HOSPITAL_BASED_OUTPATIENT_CLINIC_OR_DEPARTMENT_OTHER): Payer: Medicare Other | Admitting: Hematology & Oncology

## 2020-10-30 ENCOUNTER — Other Ambulatory Visit: Payer: Self-pay

## 2020-10-30 ENCOUNTER — Encounter: Payer: Self-pay | Admitting: Hematology & Oncology

## 2020-10-30 ENCOUNTER — Telehealth: Payer: Self-pay | Admitting: Hematology & Oncology

## 2020-10-30 ENCOUNTER — Inpatient Hospital Stay: Payer: Medicare Other

## 2020-10-30 VITALS — BP 155/100 | HR 94 | Temp 97.8°F | Resp 20 | Wt 197.8 lb

## 2020-10-30 DIAGNOSIS — C9000 Multiple myeloma not having achieved remission: Secondary | ICD-10-CM | POA: Diagnosis not present

## 2020-10-30 DIAGNOSIS — C9002 Multiple myeloma in relapse: Secondary | ICD-10-CM

## 2020-10-30 DIAGNOSIS — Z5112 Encounter for antineoplastic immunotherapy: Secondary | ICD-10-CM | POA: Diagnosis not present

## 2020-10-30 LAB — CMP (CANCER CENTER ONLY)
ALT: 14 U/L (ref 0–44)
AST: 16 U/L (ref 15–41)
Albumin: 3.4 g/dL — ABNORMAL LOW (ref 3.5–5.0)
Alkaline Phosphatase: 35 U/L — ABNORMAL LOW (ref 38–126)
Anion gap: 7 (ref 5–15)
BUN: 23 mg/dL (ref 8–23)
CO2: 25 mmol/L (ref 22–32)
Calcium: 9.1 mg/dL (ref 8.9–10.3)
Chloride: 110 mmol/L (ref 98–111)
Creatinine: 0.76 mg/dL (ref 0.44–1.00)
GFR, Estimated: >60 mL/min (ref >60)
Glucose, Bld: 87 mg/dL (ref 70–99)
Potassium: 3.8 mmol/L (ref 3.5–5.1)
Sodium: 142 mmol/L (ref 135–145)
Total Bilirubin: 0.4 mg/dL (ref 0.3–1.2)
Total Protein: 7 g/dL (ref 6.5–8.1)

## 2020-10-30 LAB — CBC WITH DIFFERENTIAL (CANCER CENTER ONLY)
Abs Immature Granulocytes: 0.02 10*3/uL (ref 0.00–0.07)
Basophils Absolute: 0 10*3/uL (ref 0.0–0.1)
Basophils Relative: 1 %
Eosinophils Absolute: 0.1 10*3/uL (ref 0.0–0.5)
Eosinophils Relative: 1 %
HCT: 35.6 % — ABNORMAL LOW (ref 36.0–46.0)
Hemoglobin: 11.5 g/dL — ABNORMAL LOW (ref 12.0–15.0)
Immature Granulocytes: 0 %
Lymphocytes Relative: 36 %
Lymphs Abs: 1.8 10*3/uL (ref 0.7–4.0)
MCH: 31.1 pg (ref 26.0–34.0)
MCHC: 32.3 g/dL (ref 30.0–36.0)
MCV: 96.2 fL (ref 80.0–100.0)
Monocytes Absolute: 0.3 10*3/uL (ref 0.1–1.0)
Monocytes Relative: 7 %
Neutro Abs: 2.8 10*3/uL (ref 1.7–7.7)
Neutrophils Relative %: 55 %
Platelet Count: 125 10*3/uL — ABNORMAL LOW (ref 150–400)
RBC: 3.7 MIL/uL — ABNORMAL LOW (ref 3.87–5.11)
RDW: 13.7 % (ref 11.5–15.5)
WBC Count: 5 10*3/uL (ref 4.0–10.5)
nRBC: 0 % (ref 0.0–0.2)

## 2020-10-30 LAB — LACTATE DEHYDROGENASE: LDH: 242 U/L — ABNORMAL HIGH (ref 98–192)

## 2020-10-30 MED ORDER — CLONIDINE HCL 0.1 MG PO TABS
ORAL_TABLET | ORAL | Status: AC
  Filled 2020-10-30: qty 2

## 2020-10-30 MED ORDER — CLONIDINE HCL 0.1 MG PO TABS
0.2000 mg | ORAL_TABLET | Freq: Once | ORAL | Status: AC
  Administered 2020-10-30: 09:00:00 0.2 mg via ORAL

## 2020-10-30 NOTE — Progress Notes (Signed)
Pt. Left the unit while I was busy with a different patient. She was stable and asymptomatic at 0910 when I asked her how she feels. She left shortly after that.

## 2020-10-30 NOTE — Telephone Encounter (Signed)
Appointments scheduled calendar printed per 12/22 los 

## 2020-10-30 NOTE — Progress Notes (Signed)
Hematology and Oncology Follow Up Visit  Tami [name] [id number] Sep 06, 1956 64 y.o. 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  She was actually hospitalized a week or 2 ago because of pneumonia.  She is having headaches now.  Her blood pressure is quite high.  I do still think there would not be able to treat her.  I am not sure exactly what is going on.  I have worry that the blood pressure is causing the headaches.  She is on Topamax but she said this is not really helping.  She is on 4 different blood pressure medications.  She says she is taking everything.  We will have to give her clonidine in the office to see if this can help bring it down a little bit.  I am sure that she will see her family doctor to try to help with the blood pressure management.  On her last checked her monoclonal spike in late November, it was down to 1.2 g/dL.  This is improving.  Her last IgG level was 2300 mg/dL.  She has had no problems with nausea or vomiting.  Is been no diarrhea.  She has had no rashes.  There is been no leg swelling.  Overall, her performance status is ECOG 2.    Medications:  Allergies as of 10/30/2020      Reactions   Benazepril Anaphylaxis, Swelling, Hives   angioedema Throat and lip swelling   Ondansetron Hcl Hives   Redness and hives post IV admin on 07/05/17   Codeine Nausea And Vomiting   Morphine Hives   Redness and hives noted post IV admin on 07/05/17      Medication List       Accurate as of October 30, 2020  8:22 AM. If you have any questions, ask your nurse or doctor.        STOP  taking these medications   NON FORMULARY Stopped by: [name], MD     TAKE these medications   acetaminophen 500 MG tablet Commonly known as: TYLENOL Take 500 mg by mouth every 6 (six) hours as needed.   ACETAMINOPHEN-BUTALBITAL 50-325 MG Tabs Take 1 tablet by mouth 3 (three) times daily as needed.   acyclovir 400 MG tablet Commonly known as: ZOVIRAX Take 1 tablet (400 mg total) by mouth 2 (two) times daily.   albuterol 108 (90 Base) MCG/ACT inhaler Commonly known as: VENTOLIN HFA Inhale 2 puffs into the lungs every 6 (six) hours as needed for wheezing or shortness of breath.   aspirin EC 81 MG tablet Take 1 tablet (81 mg total) by mouth daily.   budesonide-formoterol 160-4.5 MCG/ACT inhaler Commonly known as: SYMBICORT Inhale 2 puffs into the lungs 2 (two) times daily.   carvedilol 25 MG tablet Commonly known as: COREG Take 1 tablet (25 mg total) by mouth 2 (two) times daily with a meal.   chlorpheniramine-HYDROcodone 10-8 MG/5ML Suer Commonly known as: Tussionex Pennkinetic ER Take 5 mLs by mouth every 12 (twelve) hours as needed for cough.   conjugated estrogens vaginal cream Commonly known as: PREMARIN Place  1 Applicatorful vaginally as needed.   cyclobenzaprine 10 MG tablet Commonly known as: FLEXERIL Take 1 tablet (10 mg total) by mouth at bedtime.   dexamethasone 4 MG tablet Commonly known as: DECADRON Take 1 tablet (4 mg total) by mouth daily. Take for 2 days starting the night of chemotherapy.   fluticasone 50 MCG/ACT nasal spray Commonly known as: FLONASE Place 2 sprays into both nostrils daily.   furosemide 20 MG tablet Commonly known as: LASIX Take 1 tablet by mouth as directed.   guaiFENesin 600 MG 12 hr tablet Commonly known as: MUCINEX Take 600 mg by mouth 2 (two) times daily.   losartan 25 MG tablet Commonly known as: COZAAR Take 25 mg by mouth. Take two tablets, total of 50 mg daily   magnesium oxide 400 MG tablet Commonly known  as: MAG-OX Take 1 tablet (400 mg total) by mouth 2 (two) times daily.   meclizine 12.5 MG tablet Commonly known as: ANTIVERT Take 12.5 mg by mouth 3 (three) times daily.   melatonin 3 MG Tabs tablet Take 3 mg by mouth at bedtime.   montelukast 10 MG tablet Commonly known as: Singulair Take 1 tablet (10 mg total) by mouth at bedtime. Start taking Singulair on 11/16/2019.   MULTIVITAMIN ADULT PO Take 1 tablet by mouth daily.   ondansetron 8 MG tablet Commonly known as: Zofran Take 1 tablet (8 mg total) by mouth 2 (two) times daily as needed (Nausea or vomiting).   pantoprazole 40 MG tablet Commonly known as: PROTONIX Take 1 tablet (40 mg total) by mouth 2 (two) times daily.   prochlorperazine 10 MG tablet Commonly known as: COMPAZINE Take 1 tablet (10 mg total) by mouth every 6 (six) hours as needed (Nausea or vomiting).   promethazine 25 MG tablet Commonly known as: PHENERGAN Take 1 tablet (25 mg total) by mouth every 6 (six) hours as needed for nausea or vomiting.   spironolactone 25 MG tablet Commonly known as: ALDACTONE Take 1 tablet by mouth daily.   sucralfate 1 g tablet Commonly known as: Carafate Take 1 tablet (1 g total) by mouth 3 (three) times daily as needed. prn   topiramate 50 MG tablet Commonly known as: Topamax Take 1 tablet (50 mg total) by mouth 2 (two) times daily.   Vitamin D-3 25 MCG (1000 UT) Caps Take 1 capsule (1,000 Units total) by mouth daily.       Allergies:  Allergies  Allergen Reactions  . Benazepril Anaphylaxis, Swelling and Hives    angioedema Throat and lip swelling  . Ondansetron Hcl Hives    Redness and hives post IV admin on 07/05/17  . Codeine Nausea And Vomiting  . Morphine Hives    Redness and hives noted post IV admin on 07/05/17    Past Medical History, Surgical history, Social history, and Family History were reviewed and updated.  Review of Systems: Review of Systems  Constitutional: Negative.   HENT:  Negative.   Eyes: Negative.   Respiratory: Negative.   Cardiovascular: Negative.   Gastrointestinal: Negative.   Genitourinary: Negative.   Musculoskeletal: Negative.   Skin: Negative.   Neurological: Negative.   Endo/Heme/Allergies: Negative.   Psychiatric/Behavioral: Negative.       Physical Exam:  weight is 197 lb 12.8 oz (89.7 kg). Her oral temperature is 97.8 F (36.6 C). Her blood pressure is 155/100 (abnormal) and her pulse is 94. Her respiration is 20 and oxygen saturation is 100%.   Wt Readings from Last 3 Encounters:  10/30/20 197 lb 12.8 oz (89.7 kg)  10/02/20 196 lb (88.9 kg)  09/17/20 197 lb (89.4 kg)    Physical Exam Vitals reviewed.  HENT:     Head: Normocephalic and atraumatic.  Eyes:     Pupils: Pupils are equal, round, and reactive to light.  Cardiovascular:     Rate and Rhythm: Normal rate and regular rhythm.     Heart sounds: Normal heart sounds.  Pulmonary:     Effort: Pulmonary effort is normal.     Breath sounds: Normal breath sounds.  Abdominal:     General: Bowel sounds are normal.     Palpations: Abdomen is soft.  Musculoskeletal:        General: No tenderness or deformity. Normal range of motion.     Cervical back: Normal range of motion.  Lymphadenopathy:     Cervical: No cervical adenopathy.  Skin:    General: Skin is warm and dry.     Findings: No erythema or rash.  Neurological:     Mental Status: She is alert and oriented to person, place, and time.  Psychiatric:        Behavior: Behavior normal.        Thought Content: Thought content normal.        Judgment: Judgment normal.      Lab Results  Component Value Date   WBC 5.0 10/30/2020   HGB 11.5 (L) 10/30/2020   HCT 35.6 (L) 10/30/2020   MCV 96.2 10/30/2020   PLT 125 (L) 10/30/2020   Lab Results  Component Value Date   FERRITIN 2,483 (H) 08/26/2020   IRON 124 08/26/2020   TIBC 284 08/26/2020   UIBC 160 08/26/2020   IRONPCTSAT 44 08/26/2020   Lab Results   Component Value Date   RETICCTPCT 0.8 07/16/2016   RBC 3.70 (L) 10/30/2020   RETICCTABS 32,640 07/16/2016   Lab Results  Component Value Date   KPAFRELGTCHN 35.0 (H) 10/02/2020   LAMBDASER 2.4 (L) 10/02/2020   KAPLAMBRATIO 14.58 (H) 10/02/2020   Lab Results  Component Value Date   IGGSERUM 2,695 (H) 10/02/2020   IGA 76 (L) 10/02/2020   IGMSERUM 87 10/02/2020   Lab Results  Component Value Date   TOTALPROTELP 7.0 10/02/2020   ALBUMINELP 3.0 10/02/2020   A1GS 0.2 10/02/2020   A2GS 1.0 10/02/2020   BETS 0.9 10/02/2020   BETA2SER 0.5 07/16/2016   GAMS 1.8 10/02/2020   MSPIKE 1.2 (H) 10/02/2020   SPEI Comment 12/07/2019     Chemistry      Component Value Date/Time   NA 138 10/23/2020 0831   NA 143 11/08/2017 1127   NA 140 12/31/2016 1000   K 4.1 10/23/2020 0831   K 4.0 11/08/2017 1127   K 3.6 12/31/2016 1000   CL 108 10/23/2020 0831   CL 107 11/08/2017 1127   CO2 23 10/23/2020 0831   CO2 25 11/08/2017 1127   CO2 24 12/31/2016 1000   BUN 23 10/23/2020 0831   BUN 19 11/08/2017 1127   BUN 22.3 12/31/2016 1000   CREATININE 0.67 10/23/2020 0831   CREATININE 0.9 11/08/2017 1127   CREATININE 0.8 12/31/2016 1000      Component Value Date/Time   CALCIUM 9.7 10/23/2020 0831   CALCIUM 9.9 11/08/2017 1127   CALCIUM 9.5 12/31/2016 1000   ALKPHOS 44 10/23/2020 0831   ALKPHOS 44 11/08/2017 1127   ALKPHOS 51 12/31/2016 1000   AST 14 (L) 10/23/2020 0831   AST 28 12/31/2016 1000  ALT 15 10/23/2020 0831   ALT 36 11/08/2017 1127   ALT 16 12/31/2016 1000   BILITOT 0.5 10/23/2020 0831   BILITOT 0.46 12/31/2016 1000       Impression and Plan: Ms. Rardon is a very pleasant 64 yo African American female with a previous IgG kappa MGUS with progression to myeloma.   Again, we will going have to hold on the Faspro for couple weeks.  Her blood pressure really needs to get under better control.  Hopefully, we are still seeing a response.  We will have her come back in a couple  weeks.  I am sure that she will have a nice Christmas with her family   [name], MD 12/22/20218:22 AM

## 2020-10-31 LAB — KAPPA/LAMBDA LIGHT CHAINS
Kappa free light chain: 28.7 mg/L — ABNORMAL HIGH (ref 3.3–19.4)
Kappa, lambda light chain ratio: 11.04 — ABNORMAL HIGH (ref 0.26–1.65)
Lambda free light chains: 2.6 mg/L — ABNORMAL LOW (ref 5.7–26.3)

## 2020-10-31 LAB — IGG, IGA, IGM
IgA: 56 mg/dL — ABNORMAL LOW (ref 87–352)
IgG (Immunoglobin G), Serum: 1723 mg/dL — ABNORMAL HIGH (ref 586–1602)
IgM (Immunoglobulin M), Srm: 58 mg/dL (ref 26–217)

## 2020-11-03 DIAGNOSIS — G43809 Other migraine, not intractable, without status migrainosus: Secondary | ICD-10-CM | POA: Diagnosis not present

## 2020-11-03 DIAGNOSIS — H9201 Otalgia, right ear: Secondary | ICD-10-CM | POA: Diagnosis not present

## 2020-11-03 DIAGNOSIS — Z1159 Encounter for screening for other viral diseases: Secondary | ICD-10-CM | POA: Diagnosis not present

## 2020-11-04 NOTE — Telephone Encounter (Signed)
Request Reference Number: [id number]. NURTEC TAB 75MG  ODT is approved through 11/08/2021.

## 2020-11-05 LAB — PROTEIN ELECTROPHORESIS, SERUM, WITH REFLEX
A/G Ratio: 0.9 (ref 0.7–1.7)
Albumin ELP: 3.2 g/dL (ref 2.9–4.4)
Alpha-1-Globulin: 0.2 g/dL (ref 0.0–0.4)
Alpha-2-Globulin: 0.9 g/dL (ref 0.4–1.0)
Beta Globulin: 0.9 g/dL (ref 0.7–1.3)
Gamma Globulin: 1.6 g/dL (ref 0.4–1.8)
Globulin, Total: 3.6 g/dL (ref 2.2–3.9)
M-Spike, %: 1.1 g/dL — ABNORMAL HIGH
SPEP Interpretation: 0
Total Protein ELP: 6.8 g/dL (ref 6.0–8.5)

## 2020-11-05 LAB — IMMUNOFIXATION REFLEX, SERUM
IgA: 66 mg/dL — ABNORMAL LOW (ref 87–352)
IgG (Immunoglobin G), Serum: 2101 mg/dL — ABNORMAL HIGH (ref 586–1602)
IgM (Immunoglobulin M), Srm: 62 mg/dL (ref 26–217)

## 2020-11-06 ENCOUNTER — Telehealth: Payer: Self-pay | Admitting: *Deleted

## 2020-11-06 DIAGNOSIS — H938X1 Other specified disorders of right ear: Secondary | ICD-10-CM | POA: Diagnosis not present

## 2020-11-06 DIAGNOSIS — H9201 Otalgia, right ear: Secondary | ICD-10-CM | POA: Diagnosis not present

## 2020-11-06 NOTE — Telephone Encounter (Signed)
I have called and LM on the VM for the pt to call the office back.  She has a consult scheduled with BI on 1/18 at 9 am and we need to either reschedule her appt with BI or move it to another provider for her consult for cough since BI has procedures on 1/18.  thanks

## 2020-11-08 DIAGNOSIS — G43909 Migraine, unspecified, not intractable, without status migrainosus: Secondary | ICD-10-CM | POA: Diagnosis not present

## 2020-11-10 DIAGNOSIS — H8111 Benign paroxysmal vertigo, right ear: Secondary | ICD-10-CM | POA: Diagnosis not present

## 2020-11-10 DIAGNOSIS — R42 Dizziness and giddiness: Secondary | ICD-10-CM | POA: Diagnosis not present

## 2020-11-10 DIAGNOSIS — H6591 Unspecified nonsuppurative otitis media, right ear: Secondary | ICD-10-CM | POA: Diagnosis not present

## 2020-11-10 DIAGNOSIS — R519 Headache, unspecified: Secondary | ICD-10-CM | POA: Diagnosis not present

## 2020-11-12 DIAGNOSIS — G43809 Other migraine, not intractable, without status migrainosus: Secondary | ICD-10-CM | POA: Diagnosis not present

## 2020-11-12 DIAGNOSIS — H9209 Otalgia, unspecified ear: Secondary | ICD-10-CM | POA: Diagnosis not present

## 2020-11-12 DIAGNOSIS — D539 Nutritional anemia, unspecified: Secondary | ICD-10-CM | POA: Diagnosis not present

## 2020-11-12 DIAGNOSIS — C9000 Multiple myeloma not having achieved remission: Secondary | ICD-10-CM | POA: Diagnosis not present

## 2020-11-12 DIAGNOSIS — D72819 Decreased white blood cell count, unspecified: Secondary | ICD-10-CM | POA: Diagnosis not present

## 2020-11-12 DIAGNOSIS — I10 Essential (primary) hypertension: Secondary | ICD-10-CM | POA: Diagnosis not present

## 2020-11-12 DIAGNOSIS — I509 Heart failure, unspecified: Secondary | ICD-10-CM | POA: Diagnosis not present

## 2020-11-13 ENCOUNTER — Inpatient Hospital Stay (HOSPITAL_BASED_OUTPATIENT_CLINIC_OR_DEPARTMENT_OTHER): Payer: Medicare Other | Admitting: Family

## 2020-11-13 ENCOUNTER — Encounter: Payer: Self-pay | Admitting: Family

## 2020-11-13 ENCOUNTER — Other Ambulatory Visit: Payer: Self-pay

## 2020-11-13 ENCOUNTER — Telehealth: Payer: Self-pay

## 2020-11-13 ENCOUNTER — Inpatient Hospital Stay: Payer: Medicare Other | Attending: Hematology & Oncology

## 2020-11-13 ENCOUNTER — Inpatient Hospital Stay: Payer: Medicare Other

## 2020-11-13 VITALS — BP 123/80 | HR 82 | Temp 98.2°F | Resp 18

## 2020-11-13 VITALS — BP 140/80 | HR 95 | Temp 97.5°F | Resp 20 | Ht 66.14 in | Wt 201.1 lb

## 2020-11-13 DIAGNOSIS — C9000 Multiple myeloma not having achieved remission: Secondary | ICD-10-CM | POA: Diagnosis not present

## 2020-11-13 DIAGNOSIS — C9002 Multiple myeloma in relapse: Secondary | ICD-10-CM

## 2020-11-13 DIAGNOSIS — Z5112 Encounter for antineoplastic immunotherapy: Secondary | ICD-10-CM | POA: Insufficient documentation

## 2020-11-13 LAB — CBC WITH DIFFERENTIAL (CANCER CENTER ONLY)
Abs Immature Granulocytes: 0.02 10*3/uL (ref 0.00–0.07)
Basophils Absolute: 0 10*3/uL (ref 0.0–0.1)
Basophils Relative: 0 %
Eosinophils Absolute: 0 10*3/uL (ref 0.0–0.5)
Eosinophils Relative: 0 %
HCT: 38.6 % (ref 36.0–46.0)
Hemoglobin: 12.7 g/dL (ref 12.0–15.0)
Immature Granulocytes: 0 %
Lymphocytes Relative: 6 %
Lymphs Abs: 0.3 10*3/uL — ABNORMAL LOW (ref 0.7–4.0)
MCH: 30.9 pg (ref 26.0–34.0)
MCHC: 32.9 g/dL (ref 30.0–36.0)
MCV: 93.9 fL (ref 80.0–100.0)
Monocytes Absolute: 0.1 10*3/uL (ref 0.1–1.0)
Monocytes Relative: 2 %
Neutro Abs: 5.6 10*3/uL (ref 1.7–7.7)
Neutrophils Relative %: 92 %
Platelet Count: 131 10*3/uL — ABNORMAL LOW (ref 150–400)
RBC: 4.11 MIL/uL (ref 3.87–5.11)
RDW: 13.1 % (ref 11.5–15.5)
WBC Count: 6 10*3/uL (ref 4.0–10.5)
nRBC: 0 % (ref 0.0–0.2)

## 2020-11-13 LAB — CMP (CANCER CENTER ONLY)
ALT: 14 U/L (ref 0–44)
AST: 18 U/L (ref 15–41)
Albumin: 3.8 g/dL (ref 3.5–5.0)
Alkaline Phosphatase: 43 U/L (ref 38–126)
Anion gap: 9 (ref 5–15)
BUN: 22 mg/dL (ref 8–23)
CO2: 23 mmol/L (ref 22–32)
Calcium: 9.5 mg/dL (ref 8.9–10.3)
Chloride: 108 mmol/L (ref 98–111)
Creatinine: 0.75 mg/dL (ref 0.44–1.00)
GFR, Estimated: >60 mL/min (ref >60)
Glucose, Bld: 189 mg/dL — ABNORMAL HIGH (ref 70–99)
Potassium: 3.8 mmol/L (ref 3.5–5.1)
Sodium: 140 mmol/L (ref 135–145)
Total Bilirubin: 0.4 mg/dL (ref 0.3–1.2)
Total Protein: 7.4 g/dL (ref 6.5–8.1)

## 2020-11-13 LAB — LACTATE DEHYDROGENASE: LDH: 235 U/L — ABNORMAL HIGH (ref 98–192)

## 2020-11-13 MED ORDER — DIPHENHYDRAMINE HCL 25 MG PO CAPS
50.0000 mg | ORAL_CAPSULE | Freq: Once | ORAL | Status: AC
  Administered 2020-11-13: 50 mg via ORAL

## 2020-11-13 MED ORDER — ACETAMINOPHEN 325 MG PO TABS
ORAL_TABLET | ORAL | Status: AC
  Filled 2020-11-13: qty 2

## 2020-11-13 MED ORDER — DEXAMETHASONE 4 MG PO TABS
ORAL_TABLET | ORAL | Status: AC
  Filled 2020-11-13: qty 5

## 2020-11-13 MED ORDER — DIPHENHYDRAMINE HCL 25 MG PO CAPS
ORAL_CAPSULE | ORAL | Status: AC
  Filled 2020-11-13: qty 2

## 2020-11-13 MED ORDER — ACETAMINOPHEN 325 MG PO TABS
650.0000 mg | ORAL_TABLET | Freq: Once | ORAL | Status: AC
  Administered 2020-11-13: 650 mg via ORAL

## 2020-11-13 MED ORDER — DEXAMETHASONE 4 MG PO TABS
20.0000 mg | ORAL_TABLET | Freq: Once | ORAL | Status: AC
  Administered 2020-11-13: 20 mg via ORAL

## 2020-11-13 MED ORDER — DARATUMUMAB-HYALURONIDASE-FIHJ 1800-30000 MG-UT/15ML ~~LOC~~ SOLN
1800.0000 mg | Freq: Once | SUBCUTANEOUS | Status: AC
  Administered 2020-11-13: 1800 mg via SUBCUTANEOUS
  Filled 2020-11-13: qty 15

## 2020-11-13 NOTE — Progress Notes (Signed)
Hematology and Oncology Follow Up Visit  Tami [name] [id number] 06-06-1956 65 y.o. 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 Her blood pressure is much better today at 140/80. She states that she is still on her same regimen of antihypertensives.  She states that her headaches are better and her neurologist is having her try a new medication, Qulipta. She will start this today.  She states that she has an appointment today with ENT for her right earache. This is a little better but still present.  M-spike last month was stable at 1.1 g/dL, IgG level 8,916 mg/dL and kappa light chains 2.87 mg/dL.  No fever, chills, n/v, cough, rash, dizziness, chest pain, palpitations, abdominal pain or changes in bowel or bladder habits.  She has occasion SOB with over exertion and takes a break to rest if needed.  No swelling or tenderness in her extremities at this time.  She has intermittent numbness and tingling in her hands and feet.  No falls or syncope to report.  She has maintained a good appetite and is staying well hydrated. Her weight is stable at 201 lbs.   ECOG Performance Status: 1 - Symptomatic but completely ambulatory  Medications:  Allergies as of 11/13/2020      Reactions   Benazepril Anaphylaxis, Swelling, Hives   angioedema Throat and lip swelling   Ondansetron Hcl Hives   Redness and hives post IV admin on 07/05/17   Codeine Nausea And Vomiting   Morphine Hives   Redness and hives noted post IV admin on 07/05/17      Medication List       Accurate as of November 13, 2020 10:03 AM.  If you have any questions, ask your nurse or doctor.        acetaminophen 500 MG tablet Commonly known as: TYLENOL Take 500 mg by mouth every 6 (six) hours as needed.   ACETAMINOPHEN-BUTALBITAL 50-325 MG Tabs Take 1 tablet by mouth 3 (three) times daily as needed.   acyclovir 400 MG tablet Commonly known as: ZOVIRAX Take 1 tablet (400 mg total) by mouth 2 (two) times daily.   albuterol 108 (90 Base) MCG/ACT inhaler Commonly known as: VENTOLIN HFA Inhale 2 puffs into the lungs every 6 (six) hours as needed for wheezing or shortness of breath.   aspirin EC 81 MG tablet Take 1 tablet (81 mg total) by mouth daily.   budesonide-formoterol 160-4.5 MCG/ACT inhaler Commonly known as: SYMBICORT Inhale 2 puffs into the lungs 2 (two) times daily.   carvedilol 25 MG tablet Commonly known as: COREG Take 1 tablet (25 mg total) by mouth 2 (two) times daily with a meal.   chlorpheniramine-HYDROcodone 10-8 MG/5ML Suer Commonly known as: Tussionex Pennkinetic ER Take 5 mLs by mouth every 12 (twelve) hours as needed for cough.   conjugated estrogens vaginal cream Commonly known as: PREMARIN Place 1 Applicatorful vaginally as needed.   cyclobenzaprine 10 MG tablet Commonly known as: FLEXERIL Take 1 tablet (10 mg total) by mouth at bedtime.   dexamethasone 4 MG tablet Commonly known as: DECADRON Take 1  tablet (4 mg total) by mouth daily. Take for 2 days starting the night of chemotherapy.   fluticasone 50 MCG/ACT nasal spray Commonly known as: FLONASE Place 2 sprays into both nostrils daily.   furosemide 20 MG tablet Commonly known as: LASIX Take 1 tablet by mouth as directed.   guaiFENesin 600 MG 12 hr tablet Commonly known as: MUCINEX Take 600 mg by mouth 2 (two) times daily.   losartan 25 MG tablet Commonly known as: COZAAR Take 25 mg by mouth. Take two tablets, total of 50 mg daily   magnesium oxide 400 MG tablet Commonly known as: MAG-OX Take 1 tablet (400 mg total) by  mouth 2 (two) times daily.   meclizine 12.5 MG tablet Commonly known as: ANTIVERT Take 12.5 mg by mouth 3 (three) times daily.   melatonin 3 MG Tabs tablet Take 3 mg by mouth at bedtime.   montelukast 10 MG tablet Commonly known as: Singulair Take 1 tablet (10 mg total) by mouth at bedtime. Start taking Singulair on 11/16/2019.   MULTIVITAMIN ADULT PO Take 1 tablet by mouth daily.   ondansetron 8 MG tablet Commonly known as: Zofran Take 1 tablet (8 mg total) by mouth 2 (two) times daily as needed (Nausea or vomiting).   pantoprazole 40 MG tablet Commonly known as: PROTONIX Take 1 tablet (40 mg total) by mouth 2 (two) times daily.   prochlorperazine 10 MG tablet Commonly known as: COMPAZINE Take 1 tablet (10 mg total) by mouth every 6 (six) hours as needed (Nausea or vomiting).   promethazine 25 MG tablet Commonly known as: PHENERGAN Take 1 tablet (25 mg total) by mouth every 6 (six) hours as needed for nausea or vomiting.   spironolactone 25 MG tablet Commonly known as: ALDACTONE Take 1 tablet by mouth daily.   sucralfate 1 g tablet Commonly known as: Carafate Take 1 tablet (1 g total) by mouth 3 (three) times daily as needed. prn   topiramate 50 MG tablet Commonly known as: Topamax Take 1 tablet (50 mg total) by mouth 2 (two) times daily.   Vitamin D-3 25 MCG (1000 UT) Caps Take 1 capsule (1,000 Units total) by mouth daily.       Allergies:  Allergies  Allergen Reactions  . Benazepril Anaphylaxis, Swelling and Hives    angioedema Throat and lip swelling  . Ondansetron Hcl Hives    Redness and hives post IV admin on 07/05/17  . Codeine Nausea And Vomiting  . Morphine Hives    Redness and hives noted post IV admin on 07/05/17    Past Medical History, Surgical history, Social history, and Family History were reviewed and updated.  Review of Systems: All other 10 point review of systems is negative.   Physical Exam:  height is 5' 6.14" (1.68 m) and  weight is 201 lb 1.3 oz (91.2 kg). Her oral temperature is 97.5 F (36.4 C) (abnormal). Her blood pressure is 140/80 and her pulse is 95. Her respiration is 20 and oxygen saturation is 100%.   Wt Readings from Last 3 Encounters:  11/13/20 201 lb 1.3 oz (91.2 kg)  10/30/20 197 lb 12.8 oz (89.7 kg)  10/02/20 196 lb (88.9 kg)    Ocular: Sclerae unicteric, pupils equal, round and reactive to light Ear-nose-throat: Oropharynx clear, dentition fair Lymphatic: No cervical or supraclavicular adenopathy Lungs no rales or rhonchi, good excursion bilaterally Heart regular rate and rhythm, no murmur appreciated Abd soft, nontender, positive bowel sounds MSK no focal spinal tenderness, no joint  edema Neuro: non-focal, well-oriented, appropriate affect Breasts: Deferred   Lab Results  Component Value Date   WBC 6.0 11/13/2020   HGB 12.7 11/13/2020   HCT 38.6 11/13/2020   MCV 93.9 11/13/2020   PLT 131 (L) 11/13/2020   Lab Results  Component Value Date   FERRITIN 2,483 (H) 08/26/2020   IRON 124 08/26/2020   TIBC 284 08/26/2020   UIBC 160 08/26/2020   IRONPCTSAT 44 08/26/2020   Lab Results  Component Value Date   RETICCTPCT 0.8 07/16/2016   RBC 4.11 11/13/2020   RETICCTABS 32,640 07/16/2016   Lab Results  Component Value Date   KPAFRELGTCHN 28.7 (H) 10/30/2020   LAMBDASER 2.6 (L) 10/30/2020   KAPLAMBRATIO 11.04 (H) 10/30/2020   Lab Results  Component Value Date   IGGSERUM 1,723 (H) 10/30/2020   IGGSERUM 2,101 (H) 10/30/2020   IGA 56 (L) 10/30/2020   IGA 66 (L) 10/30/2020   IGMSERUM 58 10/30/2020   IGMSERUM 62 10/30/2020   Lab Results  Component Value Date   TOTALPROTELP 6.8 10/30/2020   ALBUMINELP 3.2 10/30/2020   A1GS 0.2 10/30/2020   A2GS 0.9 10/30/2020   BETS 0.9 10/30/2020   BETA2SER 0.5 07/16/2016   GAMS 1.6 10/30/2020   MSPIKE 1.1 (H) 10/30/2020   SPEI Comment 12/07/2019     Chemistry      Component Value Date/Time   NA 140 11/13/2020 0916   NA 143  11/08/2017 1127   NA 140 12/31/2016 1000   K 3.8 11/13/2020 0916   K 4.0 11/08/2017 1127   K 3.6 12/31/2016 1000   CL 108 11/13/2020 0916   CL 107 11/08/2017 1127   CO2 23 11/13/2020 0916   CO2 25 11/08/2017 1127   CO2 24 12/31/2016 1000   BUN 22 11/13/2020 0916   BUN 19 11/08/2017 1127   BUN 22.3 12/31/2016 1000   CREATININE 0.75 11/13/2020 0916   CREATININE 0.9 11/08/2017 1127   CREATININE 0.8 12/31/2016 1000      Component Value Date/Time   CALCIUM 9.5 11/13/2020 0916   CALCIUM 9.9 11/08/2017 1127   CALCIUM 9.5 12/31/2016 1000   ALKPHOS 43 11/13/2020 0916   ALKPHOS 44 11/08/2017 1127   ALKPHOS 51 12/31/2016 1000   AST 18 11/13/2020 0916   AST 28 12/31/2016 1000   ALT 14 11/13/2020 0916   ALT 36 11/08/2017 1127   ALT 16 12/31/2016 1000   BILITOT 0.4 11/13/2020 0916   BILITOT 0.46 12/31/2016 1000       Impression and Plan: Ms. Burdsall is a very pleasant 65 yo African American female with a previous IgG kappa MGUS with progression to myeloma.  Protein studies drawn again today, results are pending.  We will proceed with treatment today as planned.  Follow-up in 1 month.  She was encouraged to contact our office with any questions or concerns.   Laverna Peace, NP 1/5/202210:03 AM

## 2020-11-13 NOTE — Progress Notes (Signed)
Pt. refused to wait 30 minutes post injection. Stable and asymptomatic upon discharge. Pt discharged in no apparent distress. Pt left ambulatory without assistance. Pt aware of discharge instructions and verbalized understanding and had no further questions.

## 2020-11-13 NOTE — Patient Instructions (Signed)
Daratumumab injection What is this medicine? DARATUMUMAB (dar a toom ue mab) is a monoclonal antibody. It is used to treat multiple myeloma. This medicine may be used for other purposes; ask your health care provider or pharmacist if you have questions. COMMON BRAND NAME(S): DARZALEX What should I tell my health care provider before I take this medicine? They need to know if you have any of these conditions:  infection (especially a virus infection such as chickenpox, herpes, or hepatitis B virus)  lung or breathing disease  an unusual or allergic reaction to daratumumab, other medicines, foods, dyes, or preservatives  pregnant or trying to get pregnant  breast-feeding How should I use this medicine? This medicine is for infusion into a vein. It is given by a health care professional in a hospital or clinic setting. Talk to your pediatrician regarding the use of this medicine in children. Special care may be needed. Overdosage: If you think you have taken too much of this medicine contact a poison control center or emergency room at once. NOTE: This medicine is only for you. Do not share this medicine with others. What if I miss a dose? Keep appointments for follow-up doses as directed. It is important not to miss your dose. Call your doctor or health care professional if you are unable to keep an appointment. What may interact with this medicine? Interactions have not been studied. This list may not describe all possible interactions. Give your health care provider a list of all the medicines, herbs, non-prescription drugs, or dietary supplements you use. Also tell them if you smoke, drink alcohol, or use illegal drugs. Some items may interact with your medicine. What should I watch for while using this medicine? This drug may make you feel generally unwell. Report any side effects. Continue your course of treatment even though you feel ill unless your doctor tells you to stop. This  medicine can cause serious allergic reactions. To reduce your risk you may need to take medicine before treatment with this medicine. Take your medicine as directed. This medicine can affect the results of blood tests to match your blood type. These changes can last for up to 6 months after the final dose. Your healthcare provider will do blood tests to match your blood type before you start treatment. Tell all of your healthcare providers that you are being treated with this medicine before receiving a blood transfusion. This medicine can affect the results of some tests used to determine treatment response; extra tests may be needed to evaluate response. Do not become pregnant while taking this medicine or for 3 months after stopping it. Women should inform their doctor if they wish to become pregnant or think they might be pregnant. There is a potential for serious side effects to an unborn child. Talk to your health care professional or pharmacist for more information. What side effects may I notice from receiving this medicine? Side effects that you should report to your doctor or health care professional as soon as possible:  allergic reactions like skin rash, itching or hives, swelling of the face, lips, or tongue  breathing problems  chills  cough  dizziness  feeling faint or lightheaded  headache  low blood counts - this medicine may decrease the number of white blood cells, red blood cells and platelets. You may be at increased risk for infections and bleeding.  nausea, vomiting  shortness of breath  signs of decreased platelets or bleeding - bruising, pinpoint red spots on  the skin, black, tarry stools, blood in the urine  signs of decreased red blood cells - unusually weak or tired, feeling faint or lightheaded, falls  signs of infection - fever or chills, cough, sore throat, pain or difficulty passing urine  signs and symptoms of liver injury like dark yellow or brown  urine; general ill feeling or flu-like symptoms; light-colored stools; loss of appetite; right upper belly pain; unusually weak or tired; yellowing of the eyes or skin Side effects that usually do not require medical attention (report to your doctor or health care professional if they continue or are bothersome):  back pain  constipation  diarrhea  joint pain  muscle cramps  pain, tingling, numbness in the hands or feet  swelling of the ankles, feet, hands  tiredness  trouble sleeping This list may not describe all possible side effects. Call your doctor for medical advice about side effects. You may report side effects to FDA at 1-800-FDA-1088. Where should I keep my medicine? This drug is given in a hospital or clinic and will not be stored at home. NOTE: This sheet is a summary. It may not cover all possible information. If you have questions about this medicine, talk to your doctor, pharmacist, or health care provider.  2020 Elsevier/Gold Standard (2019-07-04 18:10:54)  

## 2020-11-13 NOTE — Telephone Encounter (Signed)
appts moved per pts req to thursdays, schedules printed for pt   aom

## 2020-11-14 LAB — KAPPA/LAMBDA LIGHT CHAINS
Kappa free light chain: 19.9 mg/L — ABNORMAL HIGH (ref 3.3–19.4)
Kappa, lambda light chain ratio: 10.47 — ABNORMAL HIGH (ref 0.26–1.65)
Lambda free light chains: 1.9 mg/L — ABNORMAL LOW (ref 5.7–26.3)

## 2020-11-14 LAB — IGG, IGA, IGM
IgA: 60 mg/dL — ABNORMAL LOW (ref 87–352)
IgG (Immunoglobin G), Serum: 1837 mg/dL — ABNORMAL HIGH (ref 586–1602)
IgM (Immunoglobulin M), Srm: 52 mg/dL (ref 26–217)

## 2020-11-15 LAB — PROTEIN ELECTROPHORESIS, SERUM, WITH REFLEX
A/G Ratio: 0.9 (ref 0.7–1.7)
Albumin ELP: 3.3 g/dL (ref 2.9–4.4)
Alpha-1-Globulin: 0.2 g/dL (ref 0.0–0.4)
Alpha-2-Globulin: 0.9 g/dL (ref 0.4–1.0)
Beta Globulin: 1 g/dL (ref 0.7–1.3)
Gamma Globulin: 1.6 g/dL (ref 0.4–1.8)
Globulin, Total: 3.7 g/dL (ref 2.2–3.9)
M-Spike, %: 1.1 g/dL — ABNORMAL HIGH
SPEP Interpretation: 0
Total Protein ELP: 7 g/dL (ref 6.0–8.5)

## 2020-11-15 LAB — IMMUNOFIXATION REFLEX, SERUM
IgA: 57 mg/dL — ABNORMAL LOW (ref 87–352)
IgG (Immunoglobin G), Serum: 1828 mg/dL — ABNORMAL HIGH (ref 586–1602)
IgM (Immunoglobulin M), Srm: 53 mg/dL (ref 26–217)

## 2020-11-16 ENCOUNTER — Encounter: Payer: Self-pay | Admitting: Pulmonary Disease

## 2020-11-17 DIAGNOSIS — D539 Nutritional anemia, unspecified: Secondary | ICD-10-CM | POA: Diagnosis not present

## 2020-11-17 DIAGNOSIS — D72819 Decreased white blood cell count, unspecified: Secondary | ICD-10-CM | POA: Diagnosis not present

## 2020-11-17 DIAGNOSIS — I10 Essential (primary) hypertension: Secondary | ICD-10-CM | POA: Diagnosis not present

## 2020-11-17 DIAGNOSIS — G43809 Other migraine, not intractable, without status migrainosus: Secondary | ICD-10-CM | POA: Diagnosis not present

## 2020-11-18 DIAGNOSIS — I447 Left bundle-branch block, unspecified: Secondary | ICD-10-CM | POA: Diagnosis not present

## 2020-11-18 DIAGNOSIS — G4733 Obstructive sleep apnea (adult) (pediatric): Secondary | ICD-10-CM | POA: Diagnosis not present

## 2020-11-18 DIAGNOSIS — C9001 Multiple myeloma in remission: Secondary | ICD-10-CM | POA: Diagnosis not present

## 2020-11-18 DIAGNOSIS — U071 COVID-19: Secondary | ICD-10-CM | POA: Diagnosis not present

## 2020-11-18 DIAGNOSIS — G43109 Migraine with aura, not intractable, without status migrainosus: Secondary | ICD-10-CM | POA: Diagnosis not present

## 2020-11-18 DIAGNOSIS — Z8679 Personal history of other diseases of the circulatory system: Secondary | ICD-10-CM | POA: Diagnosis not present

## 2020-11-18 DIAGNOSIS — I110 Hypertensive heart disease with heart failure: Secondary | ICD-10-CM | POA: Diagnosis not present

## 2020-11-18 DIAGNOSIS — I5042 Chronic combined systolic (congestive) and diastolic (congestive) heart failure: Secondary | ICD-10-CM | POA: Diagnosis not present

## 2020-11-18 DIAGNOSIS — D472 Monoclonal gammopathy: Secondary | ICD-10-CM | POA: Diagnosis not present

## 2020-11-19 DIAGNOSIS — I5042 Chronic combined systolic (congestive) and diastolic (congestive) heart failure: Secondary | ICD-10-CM | POA: Diagnosis not present

## 2020-11-19 DIAGNOSIS — I447 Left bundle-branch block, unspecified: Secondary | ICD-10-CM | POA: Diagnosis not present

## 2020-11-20 ENCOUNTER — Other Ambulatory Visit: Payer: Self-pay | Admitting: Pharmacist

## 2020-11-20 ENCOUNTER — Other Ambulatory Visit: Payer: Self-pay | Admitting: *Deleted

## 2020-11-20 ENCOUNTER — Ambulatory Visit: Payer: Medicare Other

## 2020-11-20 ENCOUNTER — Other Ambulatory Visit: Payer: Medicare Other

## 2020-11-20 DIAGNOSIS — I428 Other cardiomyopathies: Secondary | ICD-10-CM | POA: Diagnosis not present

## 2020-11-20 DIAGNOSIS — Z95810 Presence of automatic (implantable) cardiac defibrillator: Secondary | ICD-10-CM | POA: Diagnosis not present

## 2020-11-20 DIAGNOSIS — C9002 Multiple myeloma in relapse: Secondary | ICD-10-CM

## 2020-11-20 DIAGNOSIS — I110 Hypertensive heart disease with heart failure: Secondary | ICD-10-CM | POA: Diagnosis not present

## 2020-11-20 DIAGNOSIS — I5042 Chronic combined systolic (congestive) and diastolic (congestive) heart failure: Secondary | ICD-10-CM | POA: Diagnosis not present

## 2020-11-21 ENCOUNTER — Other Ambulatory Visit: Payer: Self-pay

## 2020-11-21 ENCOUNTER — Inpatient Hospital Stay: Payer: Medicare Other

## 2020-11-21 DIAGNOSIS — C9000 Multiple myeloma not having achieved remission: Secondary | ICD-10-CM | POA: Diagnosis not present

## 2020-11-21 DIAGNOSIS — Z5112 Encounter for antineoplastic immunotherapy: Secondary | ICD-10-CM | POA: Diagnosis not present

## 2020-11-21 DIAGNOSIS — C9002 Multiple myeloma in relapse: Secondary | ICD-10-CM

## 2020-11-21 LAB — CBC WITH DIFFERENTIAL (CANCER CENTER ONLY)
Abs Immature Granulocytes: 0.02 10*3/uL (ref 0.00–0.07)
Basophils Absolute: 0 10*3/uL (ref 0.0–0.1)
Basophils Relative: 1 %
Eosinophils Absolute: 0.1 10*3/uL (ref 0.0–0.5)
Eosinophils Relative: 1 %
HCT: 39.1 % (ref 36.0–46.0)
Hemoglobin: 12.8 g/dL (ref 12.0–15.0)
Immature Granulocytes: 0 %
Lymphocytes Relative: 24 %
Lymphs Abs: 1.1 10*3/uL (ref 0.7–4.0)
MCH: 30.8 pg (ref 26.0–34.0)
MCHC: 32.7 g/dL (ref 30.0–36.0)
MCV: 94 fL (ref 80.0–100.0)
Monocytes Absolute: 0.5 10*3/uL (ref 0.1–1.0)
Monocytes Relative: 10 %
Neutro Abs: 3 10*3/uL (ref 1.7–7.7)
Neutrophils Relative %: 64 %
Platelet Count: 141 10*3/uL — ABNORMAL LOW (ref 150–400)
RBC: 4.16 MIL/uL (ref 3.87–5.11)
RDW: 12.9 % (ref 11.5–15.5)
WBC Count: 4.7 10*3/uL (ref 4.0–10.5)
nRBC: 0 % (ref 0.0–0.2)

## 2020-11-21 LAB — COMPREHENSIVE METABOLIC PANEL
ALT: 21 U/L (ref 0–44)
AST: 21 U/L (ref 15–41)
Albumin: 3.7 g/dL (ref 3.5–5.0)
Alkaline Phosphatase: 42 U/L (ref 38–126)
Anion gap: 6 (ref 5–15)
BUN: 20 mg/dL (ref 8–23)
CO2: 30 mmol/L (ref 22–32)
Calcium: 9.7 mg/dL (ref 8.9–10.3)
Chloride: 105 mmol/L (ref 98–111)
Creatinine, Ser: 0.74 mg/dL (ref 0.44–1.00)
GFR, Estimated: >60 mL/min (ref >60)
Glucose, Bld: 117 mg/dL — ABNORMAL HIGH (ref 70–99)
Potassium: 4 mmol/L (ref 3.5–5.1)
Sodium: 141 mmol/L (ref 135–145)
Total Bilirubin: 0.5 mg/dL (ref 0.3–1.2)
Total Protein: 7 g/dL (ref 6.5–8.1)

## 2020-11-21 NOTE — Progress Notes (Signed)
This RN went to bring patient back for infusion appointment. Pt stated she did not want to walk back to infusion area secondary to being too tired. Pt stated she did not want her injection. Pt stated she has noted that 4 days after her injection she feels tired, achy and "bad" Pt states she has been going to urgent cares to receive steroids and toradol. Reviewed pt symptoms with Dr. Marin Olp and will hold treatment today. Vitals obtained and WNL. Pt educated to rest and call with any new symptoms or concerns. Pt aware to keep her appointment for next week. Pt verbalized understanding and had no further questions. Pt left ambulatory in no apparent distress.

## 2020-11-23 DIAGNOSIS — G43809 Other migraine, not intractable, without status migrainosus: Secondary | ICD-10-CM | POA: Diagnosis not present

## 2020-11-26 ENCOUNTER — Institutional Professional Consult (permissible substitution): Payer: Medicare Other | Admitting: Pulmonary Disease

## 2020-11-27 ENCOUNTER — Other Ambulatory Visit: Payer: Medicare Other

## 2020-11-27 ENCOUNTER — Ambulatory Visit: Payer: Medicare Other

## 2020-11-27 ENCOUNTER — Other Ambulatory Visit: Payer: Self-pay | Admitting: *Deleted

## 2020-11-27 DIAGNOSIS — C9000 Multiple myeloma not having achieved remission: Secondary | ICD-10-CM

## 2020-11-27 DIAGNOSIS — C9002 Multiple myeloma in relapse: Secondary | ICD-10-CM

## 2020-11-28 ENCOUNTER — Other Ambulatory Visit: Payer: Self-pay

## 2020-11-28 ENCOUNTER — Inpatient Hospital Stay: Payer: Medicare Other

## 2020-11-28 DIAGNOSIS — C9000 Multiple myeloma not having achieved remission: Secondary | ICD-10-CM | POA: Diagnosis not present

## 2020-11-28 DIAGNOSIS — C9002 Multiple myeloma in relapse: Secondary | ICD-10-CM

## 2020-11-28 DIAGNOSIS — Z5112 Encounter for antineoplastic immunotherapy: Secondary | ICD-10-CM | POA: Diagnosis not present

## 2020-11-28 LAB — CMP (CANCER CENTER ONLY)
ALT: 17 U/L (ref 0–44)
AST: 19 U/L (ref 15–41)
Albumin: 3.9 g/dL (ref 3.5–5.0)
Alkaline Phosphatase: 39 U/L (ref 38–126)
Anion gap: 8 (ref 5–15)
BUN: 20 mg/dL (ref 8–23)
CO2: 26 mmol/L (ref 22–32)
Calcium: 10.2 mg/dL (ref 8.9–10.3)
Chloride: 105 mmol/L (ref 98–111)
Creatinine: 0.84 mg/dL (ref 0.44–1.00)
GFR, Estimated: >60 mL/min (ref >60)
Glucose, Bld: 168 mg/dL — ABNORMAL HIGH (ref 70–99)
Potassium: 4.1 mmol/L (ref 3.5–5.1)
Sodium: 139 mmol/L (ref 135–145)
Total Bilirubin: 0.5 mg/dL (ref 0.3–1.2)
Total Protein: 7.9 g/dL (ref 6.5–8.1)

## 2020-11-28 LAB — CBC WITH DIFFERENTIAL (CANCER CENTER ONLY)
Abs Immature Granulocytes: 0.02 10*3/uL (ref 0.00–0.07)
Basophils Absolute: 0 10*3/uL (ref 0.0–0.1)
Basophils Relative: 0 %
Eosinophils Absolute: 0 10*3/uL (ref 0.0–0.5)
Eosinophils Relative: 0 %
HCT: 39.3 % (ref 36.0–46.0)
Hemoglobin: 13 g/dL (ref 12.0–15.0)
Immature Granulocytes: 0 %
Lymphocytes Relative: 8 %
Lymphs Abs: 0.5 10*3/uL — ABNORMAL LOW (ref 0.7–4.0)
MCH: 30.8 pg (ref 26.0–34.0)
MCHC: 33.1 g/dL (ref 30.0–36.0)
MCV: 93.1 fL (ref 80.0–100.0)
Monocytes Absolute: 0.2 10*3/uL (ref 0.1–1.0)
Monocytes Relative: 3 %
Neutro Abs: 4.9 10*3/uL (ref 1.7–7.7)
Neutrophils Relative %: 89 %
Platelet Count: 176 10*3/uL (ref 150–400)
RBC: 4.22 MIL/uL (ref 3.87–5.11)
RDW: 12.6 % (ref 11.5–15.5)
WBC Count: 5.5 10*3/uL (ref 4.0–10.5)
nRBC: 0 % (ref 0.0–0.2)

## 2020-11-28 NOTE — Progress Notes (Signed)
Dr. Marin Olp notified that pt states she is not feeling well and is concerned about the headaches she has been getting after treatments.  She states that she ends up in an urgent care 3-4 days after her treatment in need of pain medicine.  Per order of Dr. Marin Olp, hold treatment for now and have pt return in 3 weeks for labs and to see Dr. Marin Olp.  Pt notified and message sent to scheduling.

## 2020-12-05 ENCOUNTER — Ambulatory Visit (INDEPENDENT_AMBULATORY_CARE_PROVIDER_SITE_OTHER): Payer: Medicare Other | Admitting: Family Medicine

## 2020-12-05 ENCOUNTER — Encounter: Payer: Self-pay | Admitting: Family Medicine

## 2020-12-05 ENCOUNTER — Other Ambulatory Visit: Payer: Self-pay

## 2020-12-05 VITALS — BP 132/80 | HR 83 | Temp 98.6°F | Resp 16 | Wt 202.2 lb

## 2020-12-05 DIAGNOSIS — R700 Elevated erythrocyte sedimentation rate: Secondary | ICD-10-CM

## 2020-12-05 DIAGNOSIS — J302 Other seasonal allergic rhinitis: Secondary | ICD-10-CM | POA: Diagnosis not present

## 2020-12-05 DIAGNOSIS — R739 Hyperglycemia, unspecified: Secondary | ICD-10-CM | POA: Diagnosis not present

## 2020-12-05 DIAGNOSIS — G4700 Insomnia, unspecified: Secondary | ICD-10-CM | POA: Diagnosis not present

## 2020-12-05 DIAGNOSIS — R3911 Hesitancy of micturition: Secondary | ICD-10-CM | POA: Diagnosis not present

## 2020-12-05 DIAGNOSIS — R790 Abnormal level of blood mineral: Secondary | ICD-10-CM | POA: Diagnosis not present

## 2020-12-05 DIAGNOSIS — I10 Essential (primary) hypertension: Secondary | ICD-10-CM

## 2020-12-05 DIAGNOSIS — E785 Hyperlipidemia, unspecified: Secondary | ICD-10-CM

## 2020-12-05 DIAGNOSIS — R7989 Other specified abnormal findings of blood chemistry: Secondary | ICD-10-CM

## 2020-12-05 DIAGNOSIS — U071 COVID-19: Secondary | ICD-10-CM | POA: Diagnosis not present

## 2020-12-05 DIAGNOSIS — C9000 Multiple myeloma not having achieved remission: Secondary | ICD-10-CM | POA: Diagnosis not present

## 2020-12-05 NOTE — Patient Instructions (Addendum)
Consider taking Miralax and benefiber together daily to twice daily to help constipation  Encouraged increased hydration and fiber in diet. Daily probiotics. If bowels not moving can use MOM 2 tbls po in 4 oz of warm prune juice by mouth every 2-3 days. If no results then repeat in 4 hours with  Dulcolax suppository pr, may repeat again in 4 more hours as needed. Seek care if symptoms worsen. Consider daily Miralax and/or Dulcolax if symptoms persist.   Sleep Apnea Sleep apnea is a condition in which breathing pauses or becomes shallow during sleep. Episodes of sleep apnea usually last 10 seconds or longer, and they may occur as many as 20 times an hour. Sleep apnea disrupts your sleep and keeps your body from getting the rest that it needs. This condition can increase your risk of certain health problems, including:  Heart attack.  Stroke.  Obesity.  Diabetes.  Heart failure.  Irregular heartbeat. What are the causes? There are three kinds of sleep apnea:  Obstructive sleep apnea. This kind is caused by a blocked or collapsed airway.  Central sleep apnea. This kind happens when the part of the brain that controls breathing does not send the correct signals to the muscles that control breathing.  Mixed sleep apnea. This is a combination of obstructive and central sleep apnea. The most common cause of this condition is a collapsed or blocked airway. An airway can collapse or become blocked if:  Your throat muscles are abnormally relaxed.  Your tongue and tonsils are larger than normal.  You are overweight.  Your airway is smaller than normal.   What increases the risk? You are more likely to develop this condition if you:  Are overweight.  Smoke.  Have a smaller than normal airway.  Are elderly.  Are female.  Drink alcohol.  Take sedatives or tranquilizers.  Have a family history of sleep apnea. What are the signs or symptoms? Symptoms of this condition  include:  Trouble staying asleep.  Daytime sleepiness and tiredness.  Irritability.  Loud snoring.  Morning headaches.  Trouble concentrating.  Forgetfulness.  Decreased interest in sex.  Unexplained sleepiness.  Mood swings.  Personality changes.  Feelings of depression.  Waking up often during the night to urinate.  Dry mouth.  Sore throat. How is this diagnosed? This condition may be diagnosed with:  A medical history.  A physical exam.  A series of tests that are done while you are sleeping (sleep study). These tests are usually done in a sleep lab, but they may also be done at home. How is this treated? Treatment for this condition aims to restore normal breathing and to ease symptoms during sleep. It may involve managing health issues that can affect breathing, such as high blood pressure or obesity. Treatment may include:  Sleeping on your side.  Using a decongestant if you have nasal congestion.  Avoiding the use of depressants, including alcohol, sedatives, and narcotics.  Losing weight if you are overweight.  Making changes to your diet.  Quitting smoking.  Using a device to open your airway while you sleep, such as: ? An oral appliance. This is a custom-made mouthpiece that shifts your lower jaw forward. ? A continuous positive airway pressure (CPAP) device. This device blows air through a mask when you breathe out (exhale). ? A nasal expiratory positive airway pressure (EPAP) device. This device has valves that you put into each nostril. ? A bi-level positive airway pressure (BPAP) device. This device blows  air through a mask when you breathe in (inhale) and breathe out (exhale).  Having surgery if other treatments do not work. During surgery, excess tissue is removed to create a wider airway. It is important to get treatment for sleep apnea. Without treatment, this condition can lead to:  High blood pressure.  Coronary artery  disease.  In men, an inability to achieve or maintain an erection (impotence).  Reduced thinking abilities.   Follow these instructions at home: Lifestyle  Make any lifestyle changes that your health care provider recommends.  Eat a healthy, well-balanced diet.  Take steps to lose weight if you are overweight.  Avoid using depressants, including alcohol, sedatives, and narcotics.  Do not use any products that contain nicotine or tobacco, such as cigarettes, e-cigarettes, and chewing tobacco. If you need help quitting, ask your health care provider. General instructions  Take over-the-counter and prescription medicines only as told by your health care provider.  If you were given a device to open your airway while you sleep, use it only as told by your health care provider.  If you are having surgery, make sure to tell your health care provider you have sleep apnea. You may need to bring your device with you.  Keep all follow-up visits as told by your health care provider. This is important. Contact a health care provider if:  The device that you received to open your airway during sleep is uncomfortable or does not seem to be working.  Your symptoms do not improve.  Your symptoms get worse. Get help right away if:  You develop: ? Chest pain. ? Shortness of breath. ? Discomfort in your back, arms, or stomach.  You have: ? Trouble speaking. ? Weakness on one side of your body. ? Drooping in your face. These symptoms may represent a serious problem that is an emergency. Do not wait to see if the symptoms will go away. Get medical help right away. Call your local emergency services (911 in the [place] Do not drive yourself to the hospital. Summary  Sleep apnea is a condition in which breathing pauses or becomes shallow during sleep.  The most common cause is a collapsed or blocked airway.  The goal of treatment is to restore normal breathing and to ease symptoms during  sleep. This information is not intended to replace advice given to you by your health care provider. Make sure you discuss any questions you have with your health care provider. Document Revised: 04/12/2019 Document Reviewed: 06/21/2018 Elsevier Patient Education  2021 [employer].

## 2020-12-05 NOTE — Progress Notes (Signed)
Echo from [facility] on 11/19/2020    [facility]  Outside Information    CUS TTE SURFACE COMPLETE ECHO ADULT   Ref Range & Units 2 wk ago  Left Ventricular EF % 25   Resulting Agency  [facility]   Narrative Performed by [facility]                   [place], [place].                    [place]          Transthoracic Echocardiogram Report  Name: Tami [name], Tami [name] [facility]              Study Date:  11/19/2020   Height: 77 in  MRN: [id number]                               Weight: 199 lb  DOB: 22-Nov-1955                  Gender: Female    BSA: 2.0 m2  Age: 65 yrs                    Ethnicity: B     BP: 110/62 mmHg  Reason For Study: CHF; Chronic combined systolic and diastolic  congestive  heart failure ([place]                            HR: 91  Ordering Physician: Estevan Ryder      Performed By:  Gershon Mussel   Referring Physician: Essie Christine   -  -  PROCEDURE  Image Quality: Good.  -  SUMMARY  CPS 04/27/20 EF may be slightly worse.  The left ventricular size is normal.  Mild left ventricular hypertrophy  Left ventricular systolic function is severely reduced.  LV ejection fraction = 25-30%.  Left ventricular filling pattern is indeterminate.  There is severe global hypokinesis of the left ventricle.  There are regional wall motion abnormalities as specified below.  Diffuse thickening of the aortic valve with preserved cusp opening.  There is mild mitral regurgitation.   -  FINDINGS:  LEFT VENTRICLE  The left ventricular size is normal. Mild left ventricular  hypertrophy. Left  ventricular systolic function is severely reduced.  LV ejection fraction = 25-30%. Left ventricular filling pattern is  indeterminate. There is severe global hypokinesis of the left  ventricle. There are regional wall motion abnormalities as specified  below.   -  RIGHT VENTRICLE  The right ventricle is normal size. The right ventricular systolic  function is normal.   LEFT ATRIUM  The left atrial size is normal.   RIGHT ATRIUM  Right atrial size is normal.  -  AORTIC VALVE  Diffuse thickening of the aortic valve with preserved cusp opening.  There is no aortic stenosis. There is no aortic regurgitation.  -  MITRAL VALVE  The mitral valve  leaflets appear normal. There is mild mitral  regurgitation.  -  TRICUSPID VALVE  Structurally normal tricuspid valve. There is trace tricuspid  regurgitation.  -  PULMONIC VALVE  The pulmonic valve is not well visualized.  -  ARTERIES  The aortic sinus is normal size.  -  VENOUS  Pulmonary venous flow pattern is normal. IVC size was normal.  -  EFFUSION  There is trivial pericardial effusion.  -  -   MMode/2D Measurements & Calculations  EDV(MOD-sp4):  ESV(MOD-sp4):  SV(MOD-sp4):   IVC 1: 1.2 cm  88.1 ml     73.9 ml     14.3 ml          EDV(MOD-sp2):  SI(MOD-sp4):          80.6 ml     7.2 ml/m2          ESV(MOD-sp2):          53.4 ml      _______________________________________________________________________  LA area A2:   LA area A4:   LA vol: 58.8 ml  LA vol index:  19.4 cm2     18.3 cm2             29.8 ml/m2      _______________________________________________________________________  RA area A4:  10.7 cm2   Doppler Measurements & Calculations  MV E max vel:  MVA(P1/2t):   MV P1/2t max vel: Ao V2 max:  36.1 cm/sec           35.7 cm/sec    127.6 cm/sec  MV A max vel:  4.3 cm2     MV P1/2t:     Ao max PG:  6.5 mmHg  65.8 cm/sec           50.7 msec     Ao V2 mean:  MV E/A: 0.55                   83.0 cm/sec  Med Peak E' Vel:                 Ao mean PG: 3.2 mmHg  3.7 cm/sec                    Ao V2 VTI: 20.4 cm  Lat Peak E' Vel:  3.4 cm/sec  E/Lat E`: 10.7  E/Med E`: 9.8       _______________________________________________________________________  LV V1 VTI:   TR max vel:   RAP systole:   AS Dimensionless  11.1 cm     237.0 cm/sec  3.27mmHg     Index (VTI): 0.55         TR max PG:         22.5 mmHg         RVSP(TR):         25.5 mmHg   _______________________________________________________________________  Reading Physician:         MD Mathis Bud, [phone] 11/19/2020 12:38 PM  Procedure Note  McGukin, Elta Guadeloupe., MD - 11/19/2020  Formatting of this note might be different from the original.                  [facility]  [place], [place].                     [place]           Transthoracic Echocardiogram Report  Name: Tami [name], Tami [name] [facility]             Study Date:  11/19/2020   Height: 4 in  MRN: [id number]                               Weight: 199 lb  DOB: 1956-04-02                  Gender: Female    BSA: 2.0 m2  Age: 16 yrs                   Ethnicity: B     BP: 110/62 mmHg  Reason For Study: CHF; Chronic combined systolic and diastolic  congestive  heart failure ([place]                            HR: 91  Ordering Physician: Estevan Ryder     Performed By:  Gershon Mussel    Referring Physician: Essie Christine   -  -  PROCEDURE  Image Quality: Good.  -  SUMMARY  CPS 04/27/20 EF may be slightly worse.  The left ventricular size is normal.  Mild left ventricular hypertrophy  Left ventricular systolic function is severely reduced.  LV ejection fraction = 25-30%.  Left ventricular filling pattern is indeterminate.  There is severe global hypokinesis of the left ventricle.  There are regional wall motion abnormalities as specified below.  Diffuse thickening of the aortic valve with preserved cusp opening.  There is mild mitral regurgitation.   -  FINDINGS:  LEFT VENTRICLE  The left ventricular size is normal. Mild left ventricular  hypertrophy. Left ventricular systolic function is severely reduced.  LV ejection fraction = 25-30%. Left ventricular filling pattern is  indeterminate. There is severe global hypokinesis of the left  ventricle. There are regional wall motion abnormalities as specified  below.   -  RIGHT VENTRICLE  The right ventricle is normal size. The right ventricular systolic  function is normal.   LEFT ATRIUM  The left atrial size is normal.   RIGHT ATRIUM  Right atrial size is normal.  -  AORTIC VALVE  Diffuse thickening of the aortic valve with preserved cusp opening.  There is no aortic stenosis. There is no aortic regurgitation.  -  MITRAL VALVE  The mitral valve leaflets appear normal. There is mild mitral  regurgitation.  -  TRICUSPID VALVE  Structurally normal tricuspid valve. There is trace tricuspid  regurgitation.  -  PULMONIC VALVE  The pulmonic valve is not well visualized.  -  ARTERIES  The aortic sinus is normal size.  -  VENOUS  Pulmonary venous flow pattern is normal. IVC size was normal.  -  EFFUSION  There is trivial pericardial effusion.  -  -   MMode/2D Measurements & Calculations  EDV(MOD-sp4):  ESV(MOD-sp4):  SV(MOD-sp4):   IVC 1: 1.2 cm  88.1 ml     73.9  ml     14.3 ml          EDV(MOD-sp2):  SI(MOD-sp4):  80.6 ml     7.2 ml/m2          ESV(MOD-sp2):          53.4 ml   _______________________________________________________________________  LA area A2:   LA area A4:   LA vol: 58.8 ml  LA vol index:  19.4 cm2     18.3 cm2             29.2ml/m2   _______________________________________________________________________  RA area A4:  10.7 cm2   Doppler Measurements & Calculations  MV E max vel:  MVA(P1/2t):   MV P1/2t max vel: Ao V2 max:  36.1 cm/sec           35.7 cm/sec   127.6 cm/sec  MV A max vel:  4.3 cm2     MV P1/2t:     Ao max PG: 6.5 mmHg  65.8 cm/sec           50.7 msec     Ao V2 mean:  MV E/A: 0.55                   83.0 cm/sec  Med Peak E' Vel:                Ao mean PG: 3.2 mmHg  3.7 cm/sec                    Ao V2 VTI: 20.4 cm  Lat Peak E' Vel:  3.4 cm/sec  E/Lat E`: 10.7  E/Med E`: 9.8    _______________________________________________________________________  LV V1 VTI:  TR max vel:   RAP systole:   AS Dimensionless  11.1 cm     237.0 cm/sec  3.0 mmHg     Index (VTI): 0.55          TR max PG:          22.5 mmHg          RVSP(TR):          25.5 mmHg   _______________________________________________________________________  Reading Physician:           MD Mathis Bud, [phone] 11/19/2020 12:38 PM

## 2020-12-06 DIAGNOSIS — U071 COVID-19: Secondary | ICD-10-CM | POA: Insufficient documentation

## 2020-12-06 DIAGNOSIS — Z8616 Personal history of COVID-19: Secondary | ICD-10-CM | POA: Insufficient documentation

## 2020-12-06 LAB — URINALYSIS
Bilirubin Urine: NEGATIVE
Hgb urine dipstick: NEGATIVE
Ketones, ur: NEGATIVE
Leukocytes,Ua: NEGATIVE
Nitrite: NEGATIVE
Specific Gravity, Urine: >=1.03 — AB (ref 1.000–1.030)
Total Protein, Urine: NEGATIVE
Urine Glucose: NEGATIVE
Urobilinogen, UA: 0.2 (ref 0.0–1.0)
pH: 6 (ref 5.0–8.0)

## 2020-12-06 LAB — URINE CULTURE
MICRO NUMBER:: 11464915
SPECIMEN QUALITY:: ADEQUATE

## 2020-12-06 NOTE — Assessment & Plan Note (Signed)
She has recently recovered from [place] and has now had all 3 Pfizer shots. She will continue to mask and distance.

## 2020-12-06 NOTE — Assessment & Plan Note (Signed)
hgba1c acceptable, minimize simple carbs. Increase exercise as tolerated.  

## 2020-12-06 NOTE — Progress Notes (Signed)
[name], female   DOB: 08-30-1956, 65 y.o.   MRN: [id number]   Subjective:    [name], female    DOB: 1956-10-17, 65 y.o.   MRN: [id number]  Chief Complaint  Patient presents with  . Follow-up    HPI Patient is in today for follow upon chronic medical concerns. No recent febrile illness but she has been hospitalized with COVID. She has recovered now and has had her COVID booster. She is struggling with sob and cough but they are improving. Denies CP/palp/HA/fevers/GI or GU c/o. Taking meds as prescribed  Past Medical History:  Diagnosis Date  . Abnormal SPEP 07/26/2016  . Arthritis    knees, hands  . Back pain 07/14/2016  . Bowel obstruction ([place]) 11/2014  . C. difficile diarrhea   . CHF (congestive heart failure) ([place])   . Colitis   . Congestive heart failure ([place]) 02/24/2015   S/p pacemaker  . Depression   . Elevated sed rate 08/15/2017  . Epigastric pain 03/22/2017  . GERD (gastroesophageal reflux disease)   . Heart disease   . Hyperlipidemia   . Hypertension   . 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 Low back pain 07/14/2016  . 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 Multiple myeloma ([place]) 07/20/2018  . Multiple myeloma not having achieved remission ([place]) 07/20/2018  . Myalgia 12/31/2016  . Obstructive sleep apnea 03/31/2015  . SOB (shortness of breath) 04/09/2016  . Stroke ([place])    TIAs  . UTI (urinary tract infection)     Past Surgical History:  Procedure Laterality Date  . ABDOMINAL HYSTERECTOMY     menorraghia, 2006, total  . CHOLECYSTECTOMY    . COLONOSCOPY  2018   [facility] per patient  . ESOPHAGOGASTRODUODENOSCOPY  2018   [facility]   . KNEE SURGERY     right, repair torn torn cartialage  . PACEMAKER INSERTION  08/2014  . TONSILLECTOMY    . TUBAL LIGATION      Family History  Problem Relation Age of Onset  . Diabetes Mother   . Hypertension Mother   . Heart disease  Mother        s/p 1 stent  . Hyperlipidemia Mother   . Arthritis Mother   . Cancer Father        COLON  . Colon cancer Father 20  . Irritable bowel syndrome Sister   . Hyperlipidemia Daughter   . Hypertension Daughter   . Hypertension Maternal Grandmother   . Arthritis Maternal Grandmother   . Heart disease Maternal Grandfather        MI  . Hypertension Maternal Grandfather   . Arthritis Maternal Grandfather   . Hypertension Son   . Esophageal cancer Neg Hx     Social History   Socioeconomic History  . Marital status: Divorced    Spouse name: [name]  . Number of children: 3  . Years of education: [name]  . Highest education level: High school graduate  Occupational History  . [name]  Tobacco Use  . Smoking status: Never Smoker  . Smokeless tobacco: Never Used  Vaping Use  . Vaping Use: Never used  Substance and Sexual Activity  . Alcohol use: No  . Drug use: No  . Sexual activity: [name]  Other Topics Concern  . [name]  Social History Narrative   Lives at home alone   Retired   Caffeine: coffee  Social Determinants of Health   Financial Resource Strain: [name]  Food Insecurity: [name]  Transportation Needs: [name]  Physical Activity: [name]  Stress: [name]  Social Connections: [name]  Intimate Partner Violence: [name]    Outpatient Medications Prior to Visit  Medication Sig Dispense Refill  . acetaminophen (TYLENOL) 500 MG tablet Take 500 mg by mouth every 6 (six) hours as needed.    . ACETAMINOPHEN-BUTALBITAL 50-325 MG TABS Take 1 tablet by mouth 3 (three) times daily as needed. 60 tablet 0  . acyclovir (ZOVIRAX) 400 MG tablet Take 1 tablet (400 mg total) by mouth 2 (two) times daily. 60 tablet 11  . albuterol (VENTOLIN HFA) 108 (90 Base) MCG/ACT inhaler Inhale 2 puffs into the lungs every 6 (six) hours as needed for wheezing or shortness of breath. 18 g 5  . aspirin EC 81 MG tablet Take 1  tablet (81 mg total) by mouth daily. 90 tablet 1  . budesonide-formoterol (SYMBICORT) 160-4.5 MCG/ACT inhaler Inhale 2 puffs into the lungs 2 (two) times daily. 1 each 3  . carvedilol (COREG) 25 MG tablet Take 1 tablet (25 mg total) by mouth 2 (two) times daily with a meal. 180 tablet 1  . chlorpheniramine-HYDROcodone (TUSSIONEX PENNKINETIC ER) 10-8 MG/5ML SUER Take 5 mLs by mouth every 12 (twelve) hours as needed for cough. 140 mL 0  . Cholecalciferol (VITAMIN D-3) 25 MCG (1000 UT) CAPS Take 1 capsule (1,000 Units total) by mouth daily. 60 capsule 2  . conjugated estrogens (PREMARIN) vaginal cream Place 1 Applicatorful vaginally as needed.     . cyclobenzaprine (FLEXERIL) 10 MG tablet Take 1 tablet (10 mg total) by mouth at bedtime. 7 tablet 0  . dexamethasone (DECADRON) 4 MG tablet Take 1 tablet (4 mg total) by mouth daily. Take for 2 days starting the night of chemotherapy. 20 tablet 4  . fluticasone (FLONASE) 50 MCG/ACT nasal spray Place 2 sprays into both nostrils daily. 16 g 0  . furosemide (LASIX) 20 MG tablet Take 1 tablet by mouth as directed.    Marland Kitchen guaiFENesin (MUCINEX) 600 MG 12 hr tablet Take 600 mg by mouth 2 (two) times daily.    Marland Kitchen losartan (COZAAR) 25 MG tablet Take 25 mg by mouth. Take two tablets, total of 50 mg daily    . magnesium oxide (MAG-OX) 400 MG tablet Take 1 tablet (400 mg total) by mouth 2 (two) times daily. 60 tablet 1  . meclizine (ANTIVERT) 12.5 MG tablet Take 12.5 mg by mouth 3 (three) times daily.    . melatonin 3 MG TABS tablet Take 3 mg by mouth at bedtime.    . montelukast (SINGULAIR) 10 MG tablet Take 1 tablet (10 mg total) by mouth at bedtime. Start taking Singulair on 11/16/2019. 20 tablet 0  . Multiple Vitamins-Minerals (MULTIVITAMIN ADULT PO) Take 1 tablet by mouth daily.    . ondansetron (ZOFRAN) 8 MG tablet Take 1 tablet (8 mg total) by mouth 2 (two) times daily as needed (Nausea or vomiting). 30 tablet 1  . pantoprazole (PROTONIX) 40 MG tablet Take 1  tablet (40 mg total) by mouth 2 (two) times daily. 180 tablet 1  . topiramate (TOPAMAX) 50 MG tablet Take 1 tablet (50 mg total) by mouth 2 (two) times daily. 60 tablet 3  . prochlorperazine (COMPAZINE) 10 MG tablet Take 1 tablet (10 mg total) by mouth every 6 (six) hours as needed (Nausea or vomiting). (Patient  not taking: No sig reported) 30 tablet 1  . promethazine (PHENERGAN) 25 MG tablet Take 1 tablet (25 mg total) by mouth every 6 (six) hours as needed for nausea or vomiting. (Patient not taking: No sig reported) 30 tablet 0  . spironolactone (ALDACTONE) 25 MG tablet Take 1 tablet by mouth daily. (Patient not taking: No sig reported)    . sucralfate (CARAFATE) 1 g tablet Take 1 tablet (1 g total) by mouth 3 (three) times daily as needed. prn (Patient not taking: No sig reported) 90 tablet 1   No facility-administered medications prior to visit.    Allergies  Allergen Reactions  . Benazepril Anaphylaxis, Swelling and Hives    angioedema Throat and lip swelling  . Ondansetron Hcl Hives    Redness and hives post IV admin on 07/05/17  . Codeine Nausea And Vomiting  . Morphine Hives    Redness and hives noted post IV admin on 07/05/17    Review of Systems  Constitutional: Positive for malaise/fatigue. Negative for fever.  HENT: Negative for congestion.   Eyes: Negative for blurred vision.  Respiratory: Positive for cough and shortness of breath.   Cardiovascular: Negative for chest pain, palpitations and leg swelling.  Gastrointestinal: Negative for abdominal pain, blood in stool and nausea.  Genitourinary: Negative for dysuria and frequency.  Musculoskeletal: Negative for falls.  Skin: Negative for rash.  Neurological: Negative for dizziness, loss of consciousness and headaches.  Endo/Heme/Allergies: Negative for environmental allergies.  Psychiatric/Behavioral: Negative for depression. The patient is not nervous/anxious.        Objective:    Physical Exam Vitals and  nursing note reviewed.  Constitutional:      General: She is not in acute distress.    Appearance: She is well-developed and well-nourished.  HENT:     Head: Normocephalic and atraumatic.     Nose: Nose normal.  Eyes:     General:        Right eye: No discharge.        Left eye: No discharge.  Cardiovascular:     Rate and Rhythm: Normal rate and regular rhythm.     Heart sounds: No murmur heard.   Pulmonary:     Effort: Pulmonary effort is normal.     Breath sounds: Normal breath sounds.  Abdominal:     General: Bowel sounds are normal.     Palpations: Abdomen is soft.     Tenderness: There is no abdominal tenderness.  Musculoskeletal:        General: No edema.     Cervical back: Normal range of motion and neck supple.  Skin:    General: Skin is warm and dry.  Neurological:     Mental Status: She is alert and oriented to person, place, and time.  Psychiatric:        Mood and Affect: Mood and affect normal.     BP 132/80   Pulse 83   Temp 98.6 F (37 C) (Oral)   Resp 16   Wt 202 lb 3.2 oz (91.7 kg)   SpO2 95%   BMI 32.50 kg/m  Wt Readings from Last 3 Encounters:  12/05/20 202 lb 3.2 oz (91.7 kg)  11/13/20 201 lb 1.3 oz (91.2 kg)  10/30/20 197 lb 12.8 oz (89.7 kg)    Diabetic Foot Exam - Simple   No data filed    Lab Results  Component Value Date   WBC 5.5 11/28/2020   HGB 13.0 11/28/2020   HCT 39.3  11/28/2020   PLT 176 11/28/2020   GLUCOSE 168 (H) 11/28/2020   CHOL 176 07/30/2020   TRIG 200 (H) 07/30/2020   HDL 40 (L) 07/30/2020   LDLCALC 104 (H) 07/30/2020   ALT 17 11/28/2020   AST 19 11/28/2020   NA 139 11/28/2020   K 4.1 11/28/2020   CL 105 11/28/2020   CREATININE 0.84 11/28/2020   BUN 20 11/28/2020   CO2 26 11/28/2020   TSH 1.76 07/30/2020   INR 0.90 07/28/2018   HGBA1C 5.7 (H) 07/30/2020    Lab Results  Component Value Date   TSH 1.76 07/30/2020   Lab Results  Component Value Date   WBC 5.5 11/28/2020   HGB 13.0 11/28/2020    HCT 39.3 11/28/2020   MCV 93.1 11/28/2020   PLT 176 11/28/2020   Lab Results  Component Value Date   NA 139 11/28/2020   K 4.1 11/28/2020   CHLORIDE 108 12/31/2016   CO2 26 11/28/2020   GLUCOSE 168 (H) 11/28/2020   BUN 20 11/28/2020   CREATININE 0.84 11/28/2020   BILITOT 0.5 11/28/2020   ALKPHOS 39 11/28/2020   AST 19 11/28/2020   ALT 17 11/28/2020   PROT 7.9 11/28/2020   ALBUMIN 3.9 11/28/2020   CALCIUM 10.2 11/28/2020   ANIONGAP 8 11/28/2020   EGFR >90 12/31/2016   GFR 106.55 06/04/2020   Lab Results  Component Value Date   CHOL 176 07/30/2020   Lab Results  Component Value Date   HDL 40 (L) 07/30/2020   Lab Results  Component Value Date   LDLCALC 104 (H) 07/30/2020   Lab Results  Component Value Date   TRIG 200 (H) 07/30/2020   Lab Results  Component Value Date   CHOLHDL 4.4 07/30/2020   Lab Results  Component Value Date   HGBA1C 5.7 (H) 07/30/2020       Assessment & Plan:   Problem List Items Addressed This Visit    Multiple myeloma not having achieved remission ([place]) (Chronic)    She continues to follow with oncology but they have put her infusions on hold for a period to give her a break to recover.       Hyperlipidemia - Primary   Relevant Orders   Lipid panel   Hypertension    Well controlled, no changes to meds. Encouraged heart healthy diet such as the DASH diet and exercise as tolerated.       Relevant Orders   TSH   Comprehensive metabolic panel   CBC with Differential/Platelet   Insomnia   Elevated sed rate   Seasonal allergies   Hyperglycemia    hgba1c acceptable, minimize simple carbs. Increase exercise as tolerated.       Relevant Orders   Hemoglobin A1c   Low magnesium level    Supplement and monitor      Low vitamin D level    Supplement and monitor      Relevant Orders   VITAMIN D 25 Hydroxy (Vit-D Deficiency, Fractures)   COVID    She has recently recovered from [place] and has now had all 3 Pfizer shots. She  will continue to mask and distance.       Other Visit Diagnoses    Urinary hesitancy       Relevant Orders   Urinalysis (Completed)   Urine Culture      I am having Mardene Celeste A. Dhillon maintain her conjugated estrogens, pantoprazole, aspirin EC, Multiple Vitamins-Minerals (MULTIVITAMIN ADULT PO), topiramate, montelukast, fluticasone, cyclobenzaprine, meclizine,  magnesium oxide, Vitamin D-3, losartan, spironolactone, melatonin, carvedilol, furosemide, sucralfate, promethazine, acetaminophen, guaiFENesin, acyclovir, dexamethasone, prochlorperazine, ondansetron, chlorpheniramine-HYDROcodone, albuterol, ACETAMINOPHEN-BUTALBITAL, and budesonide-formoterol.  No orders of the defined types were placed in this encounter.    Penni Homans, MD

## 2020-12-06 NOTE — Assessment & Plan Note (Signed)
Well controlled, no changes to meds. Encouraged heart healthy diet such as the DASH diet and exercise as tolerated.  °

## 2020-12-06 NOTE — Assessment & Plan Note (Signed)
She continues to follow with oncology but they have put her infusions on hold for a period to give her a break to recover.

## 2020-12-06 NOTE — Assessment & Plan Note (Signed)
Supplement and monitor 

## 2020-12-08 DIAGNOSIS — Z20822 Contact with and (suspected) exposure to covid-19: Secondary | ICD-10-CM | POA: Diagnosis not present

## 2020-12-11 ENCOUNTER — Ambulatory Visit: Payer: Medicare Other | Admitting: Family

## 2020-12-11 ENCOUNTER — Ambulatory Visit: Payer: Medicare Other

## 2020-12-11 ENCOUNTER — Other Ambulatory Visit: Payer: Medicare Other

## 2020-12-12 ENCOUNTER — Ambulatory Visit: Payer: Medicare Other | Admitting: Family

## 2020-12-12 ENCOUNTER — Ambulatory Visit: Payer: Medicare Other

## 2020-12-12 ENCOUNTER — Other Ambulatory Visit: Payer: Medicare Other

## 2020-12-19 ENCOUNTER — Other Ambulatory Visit: Payer: Self-pay

## 2020-12-19 ENCOUNTER — Inpatient Hospital Stay: Payer: Medicare Other

## 2020-12-19 ENCOUNTER — Inpatient Hospital Stay: Payer: Medicare Other | Attending: Hematology & Oncology

## 2020-12-19 ENCOUNTER — Encounter: Payer: Self-pay | Admitting: Hematology & Oncology

## 2020-12-19 ENCOUNTER — Inpatient Hospital Stay (HOSPITAL_BASED_OUTPATIENT_CLINIC_OR_DEPARTMENT_OTHER): Payer: Medicare Other | Admitting: Hematology & Oncology

## 2020-12-19 VITALS — BP 148/93 | HR 92 | Temp 98.4°F | Resp 20 | Wt 203.0 lb

## 2020-12-19 DIAGNOSIS — C9001 Multiple myeloma in remission: Secondary | ICD-10-CM | POA: Diagnosis not present

## 2020-12-19 DIAGNOSIS — C9000 Multiple myeloma not having achieved remission: Secondary | ICD-10-CM | POA: Diagnosis not present

## 2020-12-19 DIAGNOSIS — C9002 Multiple myeloma in relapse: Secondary | ICD-10-CM

## 2020-12-19 LAB — CBC WITH DIFFERENTIAL (CANCER CENTER ONLY)
Abs Immature Granulocytes: 0.02 10*3/uL (ref 0.00–0.07)
Basophils Absolute: 0 10*3/uL (ref 0.0–0.1)
Basophils Relative: 1 %
Eosinophils Absolute: 0.1 10*3/uL (ref 0.0–0.5)
Eosinophils Relative: 2 %
HCT: 39.1 % (ref 36.0–46.0)
Hemoglobin: 12.7 g/dL (ref 12.0–15.0)
Immature Granulocytes: 1 %
Lymphocytes Relative: 29 %
Lymphs Abs: 1.1 10*3/uL (ref 0.7–4.0)
MCH: 30.9 pg (ref 26.0–34.0)
MCHC: 32.5 g/dL (ref 30.0–36.0)
MCV: 95.1 fL (ref 80.0–100.0)
Monocytes Absolute: 0.3 10*3/uL (ref 0.1–1.0)
Monocytes Relative: 9 %
Neutro Abs: 2.2 10*3/uL (ref 1.7–7.7)
Neutrophils Relative %: 58 %
Platelet Count: 150 10*3/uL (ref 150–400)
RBC: 4.11 MIL/uL (ref 3.87–5.11)
RDW: 12.3 % (ref 11.5–15.5)
WBC Count: 3.7 10*3/uL — ABNORMAL LOW (ref 4.0–10.5)
nRBC: 0 % (ref 0.0–0.2)

## 2020-12-19 LAB — CMP (CANCER CENTER ONLY)
ALT: 17 U/L (ref 0–44)
AST: 26 U/L (ref 15–41)
Albumin: 3.9 g/dL (ref 3.5–5.0)
Alkaline Phosphatase: 43 U/L (ref 38–126)
Anion gap: 6 (ref 5–15)
BUN: 15 mg/dL (ref 8–23)
CO2: 28 mmol/L (ref 22–32)
Calcium: 9.5 mg/dL (ref 8.9–10.3)
Chloride: 108 mmol/L (ref 98–111)
Creatinine: 0.81 mg/dL (ref 0.44–1.00)
GFR, Estimated: >60 mL/min (ref >60)
Glucose, Bld: 100 mg/dL — ABNORMAL HIGH (ref 70–99)
Potassium: 4 mmol/L (ref 3.5–5.1)
Sodium: 142 mmol/L (ref 135–145)
Total Bilirubin: 0.4 mg/dL (ref 0.3–1.2)
Total Protein: 7.2 g/dL (ref 6.5–8.1)

## 2020-12-19 LAB — LACTATE DEHYDROGENASE: LDH: 235 U/L — ABNORMAL HIGH (ref 98–192)

## 2020-12-19 NOTE — Progress Notes (Signed)
Hematology and Oncology Follow Up Visit  Tami [name] [id number] Feb 18, 1956 65 y.o. 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  We had to go ahead and stop the Faspro again.  She just was not feeling well.  She is having bad headaches again.  She had very poor quality of life.  I just do not want her to be miserable and to struggle.  Again, we are holding on the Faspro.  He did work.  Her last monoclonal spike was 1.1 g/dL.  Her IgG level was down to 1830 mg/dL.  Her kappa light chain was 2 mg/dL.  Again, we are going to hold treatment on her for right now..  She feels a whole lot better.  She is much more active.  She really has no side effects right now.  She has had no problems with abdominal pain.  There is no diarrhea.  She has had no bleeding.  Is been no leg swelling.  Currently, her performance status is ECOG 1.       Medications:  Allergies as of 12/19/2020      Reactions   Benazepril Anaphylaxis, Swelling, Hives   angioedema Throat and lip swelling   Ondansetron Hcl Hives   Redness and hives post IV admin on 07/05/17   Codeine Nausea And Vomiting   Morphine Hives   Redness and hives noted post IV admin on 07/05/17      Medication List       Accurate as of December 19, 2020 11:03 AM. If you have any questions, ask your nurse or doctor.        STOP taking these medications   acyclovir 400 MG tablet Commonly known as: ZOVIRAX Stopped by: [name], MD   dexamethasone 4 MG tablet Commonly known as: DECADRON Stopped by: [name], MD    ondansetron 8 MG tablet Commonly known as: Zofran Stopped by: [name], MD   prochlorperazine 10 MG tablet Commonly known as: COMPAZINE Stopped by: [name], MD     TAKE these medications   acetaminophen 500 MG tablet Commonly known as: TYLENOL Take 500 mg by mouth every 6 (six) hours as needed.   ACETAMINOPHEN-BUTALBITAL 50-325 MG Tabs Take 1 tablet by mouth 3 (three) times daily as needed.   albuterol 108 (90 Base) MCG/ACT inhaler Commonly known as: VENTOLIN HFA Inhale 2 puffs into the lungs every 6 (six) hours as needed for wheezing or shortness of breath.   aspirin EC 81 MG tablet Take 1 tablet (81 mg total) by mouth daily.   budesonide-formoterol 160-4.5 MCG/ACT inhaler Commonly known as: SYMBICORT Inhale 2 puffs into the lungs 2 (two) times daily.   carvedilol 25 MG tablet Commonly known as: COREG Take 1 tablet (25 mg total) by mouth 2 (two) times daily with a meal.   chlorpheniramine-HYDROcodone 10-8 MG/5ML Suer Commonly known as: Tussionex Pennkinetic ER Take 5 mLs by mouth every 12 (twelve) hours as needed for cough.   conjugated estrogens vaginal cream Commonly known as: PREMARIN Place 1 Applicatorful vaginally as needed.  cyclobenzaprine 10 MG tablet Commonly known as: FLEXERIL Take 1 tablet (10 mg total) by mouth at bedtime.   fluticasone 50 MCG/ACT nasal spray Commonly known as: FLONASE Place 2 sprays into both nostrils daily.   furosemide 20 MG tablet Commonly known as: LASIX Take 1 tablet by mouth as directed.   guaiFENesin 600 MG 12 hr tablet Commonly known as: MUCINEX Take 600 mg by mouth 2 (two) times daily.   losartan 25 MG tablet Commonly known as: COZAAR Take 25 mg by mouth. Take two tablets, total of 50 mg daily   magnesium oxide 400 MG tablet Commonly known as: MAG-OX Take 1 tablet (400 mg total) by mouth 2 (two) times daily.   meclizine 12.5 MG tablet Commonly known as: ANTIVERT Take 12.5 mg by mouth 3 (three)  times daily.   melatonin 3 MG Tabs tablet Take 3 mg by mouth at bedtime.   montelukast 10 MG tablet Commonly known as: Singulair Take 1 tablet (10 mg total) by mouth at bedtime. Start taking Singulair on 11/16/2019.   MULTIVITAMIN ADULT PO Take 1 tablet by mouth daily.   pantoprazole 40 MG tablet Commonly known as: PROTONIX Take 1 tablet (40 mg total) by mouth 2 (two) times daily.   promethazine 25 MG tablet Commonly known as: PHENERGAN Take 1 tablet (25 mg total) by mouth every 6 (six) hours as needed for nausea or vomiting.   spironolactone 25 MG tablet Commonly known as: ALDACTONE Take 1 tablet by mouth daily.   sucralfate 1 g tablet Commonly known as: Carafate Take 1 tablet (1 g total) by mouth 3 (three) times daily as needed. prn   topiramate 50 MG tablet Commonly known as: Topamax Take 1 tablet (50 mg total) by mouth 2 (two) times daily.   Vitamin D-3 25 MCG (1000 UT) Caps Take 1 capsule (1,000 Units total) by mouth daily.       Allergies:  Allergies  Allergen Reactions  . Benazepril Anaphylaxis, Swelling and Hives    angioedema Throat and lip swelling  . Ondansetron Hcl Hives    Redness and hives post IV admin on 07/05/17  . Codeine Nausea And Vomiting  . Morphine Hives    Redness and hives noted post IV admin on 07/05/17    Past Medical History, Surgical history, Social history, and Family History were reviewed and updated.  Review of Systems: Review of Systems  Constitutional: Negative.   HENT: Negative.   Eyes: Negative.   Respiratory: Negative.   Cardiovascular: Negative.   Gastrointestinal: Negative.   Genitourinary: Negative.   Musculoskeletal: Negative.   Skin: Negative.   Neurological: Negative.   Endo/Heme/Allergies: Negative.   Psychiatric/Behavioral: Negative.       Physical Exam:  weight is 203 lb (92.1 kg). Her oral temperature is 98.4 F (36.9 C). Her blood pressure is 148/93 (abnormal) and her pulse is 92. Her respiration  is 20 and oxygen saturation is 99%.   Wt Readings from Last 3 Encounters:  12/19/20 203 lb (92.1 kg)  12/05/20 202 lb 3.2 oz (91.7 kg)  11/13/20 201 lb 1.3 oz (91.2 kg)    Physical Exam Vitals reviewed.  HENT:     Head: Normocephalic and atraumatic.  Eyes:     Pupils: Pupils are equal, round, and reactive to light.  Cardiovascular:     Rate and Rhythm: Normal rate and regular rhythm.     Heart sounds: Normal heart sounds.  Pulmonary:     Effort: Pulmonary effort is normal.  Breath sounds: Normal breath sounds.  Abdominal:     General: Bowel sounds are normal.     Palpations: Abdomen is soft.  Musculoskeletal:        General: No tenderness or deformity. Normal range of motion.     Cervical back: Normal range of motion.  Lymphadenopathy:     Cervical: No cervical adenopathy.  Skin:    General: Skin is warm and dry.     Findings: No erythema or rash.  Neurological:     Mental Status: She is alert and oriented to person, place, and time.  Psychiatric:        Behavior: Behavior normal.        Thought Content: Thought content normal.        Judgment: Judgment normal.      Lab Results  Component Value Date   WBC 3.7 (L) 12/19/2020   HGB 12.7 12/19/2020   HCT 39.1 12/19/2020   MCV 95.1 12/19/2020   PLT 150 12/19/2020   Lab Results  Component Value Date   FERRITIN 2,483 (H) 08/26/2020   IRON 124 08/26/2020   TIBC 284 08/26/2020   UIBC 160 08/26/2020   IRONPCTSAT 44 08/26/2020   Lab Results  Component Value Date   RETICCTPCT 0.8 07/16/2016   RBC 4.11 12/19/2020   RETICCTABS 32,640 07/16/2016   Lab Results  Component Value Date   KPAFRELGTCHN 19.9 (H) 11/13/2020   LAMBDASER 1.9 (L) 11/13/2020   KAPLAMBRATIO 10.47 (H) 11/13/2020   Lab Results  Component Value Date   IGGSERUM 1,837 (H) 11/13/2020   IGGSERUM 1,828 (H) 11/13/2020   IGA 60 (L) 11/13/2020   IGA 57 (L) 11/13/2020   IGMSERUM 52 11/13/2020   IGMSERUM 53 11/13/2020   Lab Results  Component  Value Date   TOTALPROTELP 7.0 11/13/2020   ALBUMINELP 3.3 11/13/2020   A1GS 0.2 11/13/2020   A2GS 0.9 11/13/2020   BETS 1.0 11/13/2020   BETA2SER 0.5 07/16/2016   GAMS 1.6 11/13/2020   MSPIKE 1.1 (H) 11/13/2020   SPEI Comment 12/07/2019     Chemistry      Component Value Date/Time   NA 142 12/19/2020 0946   NA 143 11/08/2017 1127   NA 140 12/31/2016 1000   K 4.0 12/19/2020 0946   K 4.0 11/08/2017 1127   K 3.6 12/31/2016 1000   CL 108 12/19/2020 0946   CL 107 11/08/2017 1127   CO2 28 12/19/2020 0946   CO2 25 11/08/2017 1127   CO2 24 12/31/2016 1000   BUN 15 12/19/2020 0946   BUN 19 11/08/2017 1127   BUN 22.3 12/31/2016 1000   CREATININE 0.81 12/19/2020 0946   CREATININE 0.9 11/08/2017 1127   CREATININE 0.8 12/31/2016 1000      Component Value Date/Time   CALCIUM 9.5 12/19/2020 0946   CALCIUM 9.9 11/08/2017 1127   CALCIUM 9.5 12/31/2016 1000   ALKPHOS 43 12/19/2020 0946   ALKPHOS 44 11/08/2017 1127   ALKPHOS 51 12/31/2016 1000   AST 26 12/19/2020 0946   AST 28 12/31/2016 1000   ALT 17 12/19/2020 0946   ALT 36 11/08/2017 1127   ALT 16 12/31/2016 1000   BILITOT 0.4 12/19/2020 0946   BILITOT 0.46 12/31/2016 1000       Impression and Plan: Ms. Harrington is a very pleasant 65 yo African American female with a previous IgG kappa MGUS with progression to myeloma.   We will see how she does off Faspro.  Obviously, she just has problems with this.  I know she responds to it.  I suppose one option for her might be the "cousin" of daratumumab- Isatuximab Psychologist, prison and probation services).  Hopefully, we will be able to get a lot of "mileage" out of the Faspro that she did have.  I would like to see her back in 6 weeks.  Hopefully she will continue to have a wonderful quality of life and be able to enjoy her family and do what she would like to do.   [name], MD 2/10/202211:03 AM

## 2020-12-20 LAB — KAPPA/LAMBDA LIGHT CHAINS
Kappa free light chain: 35.6 mg/L — ABNORMAL HIGH (ref 3.3–19.4)
Kappa, lambda light chain ratio: 5.48 — ABNORMAL HIGH (ref 0.26–1.65)
Lambda free light chains: 6.5 mg/L (ref 5.7–26.3)

## 2020-12-20 LAB — IGG, IGA, IGM
IgA: 60 mg/dL — ABNORMAL LOW (ref 87–352)
IgG (Immunoglobin G), Serum: 1529 mg/dL (ref 586–1602)
IgM (Immunoglobulin M), Srm: 45 mg/dL (ref 26–217)

## 2020-12-21 LAB — PROTEIN ELECTROPHORESIS, SERUM
A/G Ratio: 0.9 (ref 0.7–1.7)
Albumin ELP: 3.3 g/dL (ref 2.9–4.4)
Alpha-1-Globulin: 0.3 g/dL (ref 0.0–0.4)
Alpha-2-Globulin: 1 g/dL (ref 0.4–1.0)
Beta Globulin: 1 g/dL (ref 0.7–1.3)
Gamma Globulin: 1.3 g/dL (ref 0.4–1.8)
Globulin, Total: 3.6 g/dL (ref 2.2–3.9)
M-Spike, %: 1 g/dL — ABNORMAL HIGH
Total Protein ELP: 6.9 g/dL (ref 6.0–8.5)

## 2020-12-26 DIAGNOSIS — H7011 Chronic mastoiditis, right ear: Secondary | ICD-10-CM | POA: Diagnosis not present

## 2020-12-27 DIAGNOSIS — G43909 Migraine, unspecified, not intractable, without status migrainosus: Secondary | ICD-10-CM | POA: Diagnosis not present

## 2020-12-30 DIAGNOSIS — G43809 Other migraine, not intractable, without status migrainosus: Secondary | ICD-10-CM | POA: Diagnosis not present

## 2020-12-30 DIAGNOSIS — Z20822 Contact with and (suspected) exposure to covid-19: Secondary | ICD-10-CM | POA: Diagnosis not present

## 2020-12-30 DIAGNOSIS — M542 Cervicalgia: Secondary | ICD-10-CM | POA: Diagnosis not present

## 2020-12-30 DIAGNOSIS — G4700 Insomnia, unspecified: Secondary | ICD-10-CM | POA: Diagnosis not present

## 2020-12-30 DIAGNOSIS — Z79899 Other long term (current) drug therapy: Secondary | ICD-10-CM | POA: Diagnosis not present

## 2020-12-30 DIAGNOSIS — R110 Nausea: Secondary | ICD-10-CM | POA: Diagnosis not present

## 2021-01-03 ENCOUNTER — Ambulatory Visit: Payer: Medicare Other | Admitting: Family Medicine

## 2021-01-03 DIAGNOSIS — H9201 Otalgia, right ear: Secondary | ICD-10-CM | POA: Diagnosis not present

## 2021-01-03 DIAGNOSIS — J302 Other seasonal allergic rhinitis: Secondary | ICD-10-CM | POA: Diagnosis not present

## 2021-01-03 DIAGNOSIS — R519 Headache, unspecified: Secondary | ICD-10-CM | POA: Diagnosis not present

## 2021-01-06 ENCOUNTER — Other Ambulatory Visit: Payer: Self-pay

## 2021-01-06 ENCOUNTER — Ambulatory Visit (INDEPENDENT_AMBULATORY_CARE_PROVIDER_SITE_OTHER): Payer: Medicare Other | Admitting: Family Medicine

## 2021-01-06 ENCOUNTER — Encounter: Payer: Self-pay | Admitting: Family Medicine

## 2021-01-06 VITALS — BP 136/86 | HR 84 | Temp 97.8°F | Ht 66.0 in | Wt 203.4 lb

## 2021-01-06 DIAGNOSIS — H9201 Otalgia, right ear: Secondary | ICD-10-CM

## 2021-01-06 MED ORDER — PREDNISONE 20 MG PO TABS
40.0000 mg | ORAL_TABLET | Freq: Every day | ORAL | 0 refills | Status: AC
Start: 2021-01-06 — End: 2021-01-11

## 2021-01-06 NOTE — Patient Instructions (Signed)
OK to take Tylenol 1000 mg (2 extra strength tabs) or 975 mg (3 regular strength tabs) every 6 hours as needed.  Send a message to Dr. Warren Lacy next Monday if this is not getting better.  Let us know if you need anything.

## 2021-01-06 NOTE — Progress Notes (Signed)
Chief Complaint  Patient presents with  . Ear Pain    Right ear pain     Pt is here for right ear pain. Duration: 3 weeks Progression: unchanged Associated symptoms: headaches, balance issues, chills Denies: fevers, hearing loss, bleeding, or discharge from ear  Treatment to date: Doxycycline helped w pain behind ear (tx'd for mastoiditis), but not the inner ear pain  Past Medical History:  Diagnosis Date  . Abnormal SPEP 07/26/2016  . Arthritis    knees, hands  . Back pain 07/14/2016  . Bowel obstruction ([place]) 11/2014  . C. difficile diarrhea   . CHF (congestive heart failure) ([place])   . Colitis   . Congestive heart failure ([place]) 02/24/2015   S/p pacemaker  . Depression   . Elevated sed rate 08/15/2017  . Epigastric pain 03/22/2017  . GERD (gastroesophageal reflux disease)   . Heart disease   . Hyperlipidemia   . Hypertension   . 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 Low back pain 07/14/2016  . 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 Multiple myeloma ([place]) 07/20/2018  . Multiple myeloma not having achieved remission ([place]) 07/20/2018  . Myalgia 12/31/2016  . Obstructive sleep apnea 03/31/2015  . SOB (shortness of breath) 04/09/2016  . Stroke ([place])    TIAs  . UTI (urinary tract infection)     BP 136/86 (BP Location: Left Arm, Patient Position: Sitting, Cuff Size: Large)   Pulse 84   Temp 97.8 F (36.6 C) (Oral)   Ht $R'5\' 6"'jQ$  (1.676 m)   Wt 203 lb 6 oz (92.3 kg)   SpO2 96%   BMI 32.83 kg/m  General: Awake, alert, appearing stated age HEENT:  L ear- Canal patent without drainage or erythema, TM is neg R ear- canal patent without drainage or erythema, TM is neg Nose- nares patent and without discharge Mouth- Lips, gums and dentition unremarkable, pharynx is without erythema or exudate Neck: No adenopathy Lungs: Normal effort, no accessory muscle use MSK: No ttp over mastoid process Psych: Age appropriate judgment and insight, normal mood and  affect  Right ear pain - Plan: predniSONE (DELTASONE) 20 MG tablet  5 d pred burst, 40 mg/d for presumed ETD. If no improvement by next Mon, pt will call Dr. Warren Lacy of the ENT team.  F/u prn.  Pt voiced understanding and agreement to the plan.  [place], DO 01/06/21 8:30 AM

## 2021-01-23 DIAGNOSIS — H35713 Central serous chorioretinopathy, bilateral: Secondary | ICD-10-CM | POA: Diagnosis not present

## 2021-01-23 DIAGNOSIS — H04123 Dry eye syndrome of bilateral lacrimal glands: Secondary | ICD-10-CM | POA: Diagnosis not present

## 2021-01-26 DIAGNOSIS — R110 Nausea: Secondary | ICD-10-CM | POA: Diagnosis not present

## 2021-01-26 DIAGNOSIS — E559 Vitamin D deficiency, unspecified: Secondary | ICD-10-CM | POA: Diagnosis not present

## 2021-01-26 DIAGNOSIS — Z20822 Contact with and (suspected) exposure to covid-19: Secondary | ICD-10-CM | POA: Diagnosis not present

## 2021-01-26 DIAGNOSIS — R232 Flushing: Secondary | ICD-10-CM | POA: Diagnosis not present

## 2021-01-26 DIAGNOSIS — R0602 Shortness of breath: Secondary | ICD-10-CM | POA: Diagnosis not present

## 2021-01-26 DIAGNOSIS — M542 Cervicalgia: Secondary | ICD-10-CM | POA: Diagnosis not present

## 2021-01-26 DIAGNOSIS — G43809 Other migraine, not intractable, without status migrainosus: Secondary | ICD-10-CM | POA: Diagnosis not present

## 2021-01-26 DIAGNOSIS — Z79899 Other long term (current) drug therapy: Secondary | ICD-10-CM | POA: Diagnosis not present

## 2021-01-26 DIAGNOSIS — Z0000 Encounter for general adult medical examination without abnormal findings: Secondary | ICD-10-CM | POA: Diagnosis not present

## 2021-01-29 ENCOUNTER — Ambulatory Visit (INDEPENDENT_AMBULATORY_CARE_PROVIDER_SITE_OTHER): Payer: Medicare Other | Admitting: Medical

## 2021-01-29 ENCOUNTER — Encounter: Payer: Self-pay | Admitting: Medical

## 2021-01-29 ENCOUNTER — Other Ambulatory Visit: Payer: Self-pay

## 2021-01-29 VITALS — BP 140/80 | HR 97 | Temp 97.9°F | Resp 20 | Ht 66.0 in | Wt 205.4 lb

## 2021-01-29 DIAGNOSIS — J3489 Other specified disorders of nose and nasal sinuses: Secondary | ICD-10-CM | POA: Diagnosis not present

## 2021-01-29 DIAGNOSIS — H9201 Otalgia, right ear: Secondary | ICD-10-CM | POA: Diagnosis not present

## 2021-01-29 DIAGNOSIS — H6983 Other specified disorders of Eustachian tube, bilateral: Secondary | ICD-10-CM

## 2021-01-29 MED ORDER — PREDNISONE 10 MG (21) PO TBPK: ORAL_TABLET | ORAL | 0 refills | Status: DC

## 2021-01-29 MED ORDER — CEFTRIAXONE SODIUM 1 G IJ SOLR
1.0000 g | Freq: Once | INTRAMUSCULAR | Status: AC
Start: 2021-01-29 — End: 2021-01-29
  Administered 2021-01-29: 1 g via INTRAMUSCULAR

## 2021-01-29 MED ORDER — AMOXICILLIN-POT CLAVULANATE 875-125 MG PO TABS: 1.0000 | ORAL_TABLET | Freq: Two times a day (BID) | ORAL | 0 refills | Status: DC

## 2021-01-29 NOTE — Patient Instructions (Signed)
History of chronic intermittent recurrent right ear pain with probable eustachian tube dysfunction based on your recollection of what ears nose and throat doctor explained to you.  Presently you have some sinus pressure on exam and a little bit of tenderness behind right ear.  We will prescribe Augmentin antibiotic and we gave you 1 g Rocephin IM in the office.  You do have some recent possible allergy signs and symptoms so recommend continuing Flonase and Zyrtec.  Also think tapered prednisone 6-day course would be helpful in relieving symptoms.  I do want you to go ahead and call your ENT office and get scheduled for follow-up.  Also schedule next week with me on Monday or Tuesday to recheck.  If you have any increasing pain behind your ear then would consider CT imaging of mastoid area.  If you have any severe increasing pain in the area pending follow-up with me then recommend ED evaluation.

## 2021-01-29 NOTE — Progress Notes (Signed)
   Subjective:    [name], female    DOB: December 01, 1955, 65 y.o.   MRN: [id number]  HPI Pt in with some increasing rt ear pain for one week but last 2 days pain on severe. Some runny nose and sinus pressure as well.   Pt had some pain in feb as well Saw Dr Nani Ravens in pat . Pt has seen ENT Dr. Warren Lacy in January as well.   Pt never followed up with Dr. Warren Lacy.Pt never called to make appointment.   Pt describes that she may have eustachian tube dysfunction along with infection.  Pt is on zyrtec daily and is on flonase.  Pt is not diabetic.    Review of Systems  Constitutional: Negative for chills, fatigue and fever.  HENT: Positive for ear pain, sinus pressure and sinus pain. Negative for congestion.   Respiratory: Negative for cough, chest tightness, shortness of breath and wheezing.   Cardiovascular: Negative for chest pain and palpitations.  Gastrointestinal: Negative for abdominal pain and blood in stool.  Musculoskeletal: Negative for back pain and gait problem.  Skin: Negative for rash.  Neurological: Negative for dizziness, syncope, weakness, numbness and headaches.  Hematological: Negative for adenopathy. Does not bruise/bleed easily.  Psychiatric/Behavioral: Negative for behavioral problems, dysphoric mood, sleep disturbance and suicidal ideas. The patient is not nervous/anxious and is not hyperactive.        Objective:   Physical Exam   General  Mental Status - Alert. General Appearance - Well groomed. Not in acute distress.  Skin Rashes- No Rashes.  HEENT Head- Normal. Ear Auditory Canal - Left- Normal. Right - Normal.Tympanic Membrane- Left- Normal. Right- Normal.(mild pain on palpation behind rt ear. Mild tragal tender) Eye Sclera/Conjunctiva- Left- Normal. Right- Normal. Nose & Sinuses Nasal Mucosa- Left-  Boggy and Congested. Right-  Boggy and  Congested.Bilateral maxillary and frontal sinus pressure. Mouth & Throat Lips: Upper Lip-  Normal: no dryness, cracking, pallor, cyanosis, or vesicular eruption. Lower Lip-Normal: no dryness, cracking, pallor, cyanosis or vesicular eruption. Buccal Mucosa- Bilateral- No Aphthous ulcers. Oropharynx- No Discharge or Erythema. Tonsils: Characteristics- Bilateral- No Erythema or Congestion. Size/Enlargement- Bilateral- No enlargement. Discharge- bilateral-None.  Neck Neck- Supple. No Masses.   Chest and Lung Exam Auscultation: Breath Sounds:-Clear even and unlabored.  Cardiovascular Auscultation:Rythm- Regular, rate and rhythm. Murmurs & Other Heart Sounds:Ausculatation of the heart reveal- No Murmurs.  Lymphatic Head & Neck General Head & Neck Lymphatics: Bilateral: Description- No Localized lymphadenopathy.     Assessment & Plan:  History of chronic intermittent recurrent right ear pain with probable eustachian tube dysfunction based on your recollection of what ears nose and throat doctor explained to you.  Presently you have some sinus pressure on exam and a little bit of tenderness behind right ear.  We will prescribe Augmentin antibiotic and we gave you 1 g Rocephin IM in the office.  You do have some recent possible allergy signs and symptoms so recommend continuing Flonase and Zyrtec.  Also think tapered prednisone 6-day course would be helpful in relieving symptoms.  I do want you to go ahead and call your ENT office and get scheduled for follow-up.  Also schedule next week with me on Monday or Tuesday to recheck.  If you have any increasing pain behind your ear then would consider CT imaging of mastoid area.  If you have any severe increasing pain in the area pending follow-up with me then recommend ED evaluation.

## 2021-01-30 ENCOUNTER — Inpatient Hospital Stay: Payer: Medicare Other | Attending: Hematology & Oncology | Admitting: Hematology & Oncology

## 2021-01-30 ENCOUNTER — Other Ambulatory Visit: Payer: Self-pay

## 2021-01-30 ENCOUNTER — Inpatient Hospital Stay: Payer: Medicare Other | Attending: Hematology & Oncology

## 2021-01-30 ENCOUNTER — Encounter: Payer: Self-pay | Admitting: Hematology & Oncology

## 2021-01-30 ENCOUNTER — Other Ambulatory Visit: Payer: Medicare Other

## 2021-01-30 ENCOUNTER — Other Ambulatory Visit (INDEPENDENT_AMBULATORY_CARE_PROVIDER_SITE_OTHER): Payer: Medicare Other

## 2021-01-30 VITALS — BP 151/93 | HR 96 | Temp 98.2°F | Resp 18 | Wt 203.0 lb

## 2021-01-30 DIAGNOSIS — R7989 Other specified abnormal findings of blood chemistry: Secondary | ICD-10-CM | POA: Diagnosis not present

## 2021-01-30 DIAGNOSIS — R739 Hyperglycemia, unspecified: Secondary | ICD-10-CM | POA: Diagnosis not present

## 2021-01-30 DIAGNOSIS — C9001 Multiple myeloma in remission: Secondary | ICD-10-CM

## 2021-01-30 DIAGNOSIS — E785 Hyperlipidemia, unspecified: Secondary | ICD-10-CM | POA: Diagnosis not present

## 2021-01-30 DIAGNOSIS — C9000 Multiple myeloma not having achieved remission: Secondary | ICD-10-CM | POA: Insufficient documentation

## 2021-01-30 DIAGNOSIS — I10 Essential (primary) hypertension: Secondary | ICD-10-CM

## 2021-01-30 LAB — CBC WITH DIFFERENTIAL/PLATELET
Basophils Absolute: 0 10*3/uL (ref 0.0–0.1)
Basophils Relative: 0.1 % (ref 0.0–3.0)
Eosinophils Absolute: 0 10*3/uL (ref 0.0–0.7)
Eosinophils Relative: 0 % (ref 0.0–5.0)
HCT: 38.7 % (ref 36.0–46.0)
Hemoglobin: 12.9 g/dL (ref 12.0–15.0)
Lymphocytes Relative: 8.7 % — ABNORMAL LOW (ref 12.0–46.0)
Lymphs Abs: 0.5 10*3/uL — ABNORMAL LOW (ref 0.7–4.0)
MCHC: 33.3 g/dL (ref 30.0–36.0)
MCV: 92.2 fl (ref 78.0–100.0)
Monocytes Absolute: 0.2 10*3/uL (ref 0.1–1.0)
Monocytes Relative: 3.3 % (ref 3.0–12.0)
Neutro Abs: 4.9 10*3/uL (ref 1.4–7.7)
Neutrophils Relative %: 87.9 % — ABNORMAL HIGH (ref 43.0–77.0)
Platelets: 148 10*3/uL — ABNORMAL LOW (ref 150.0–400.0)
RBC: 4.2 Mil/uL (ref 3.87–5.11)
RDW: 13.1 % (ref 11.5–15.5)
WBC: 5.6 10*3/uL (ref 4.0–10.5)

## 2021-01-30 LAB — CBC WITH DIFFERENTIAL (CANCER CENTER ONLY)
Abs Immature Granulocytes: 0.01 10*3/uL (ref 0.00–0.07)
Basophils Absolute: 0 10*3/uL (ref 0.0–0.1)
Basophils Relative: 0 %
Eosinophils Absolute: 0 10*3/uL (ref 0.0–0.5)
Eosinophils Relative: 0 %
HCT: 39.4 % (ref 36.0–46.0)
Hemoglobin: 13.1 g/dL (ref 12.0–15.0)
Immature Granulocytes: 0 %
Lymphocytes Relative: 9 %
Lymphs Abs: 0.5 10*3/uL — ABNORMAL LOW (ref 0.7–4.0)
MCH: 30.1 pg (ref 26.0–34.0)
MCHC: 33.2 g/dL (ref 30.0–36.0)
MCV: 90.6 fL (ref 80.0–100.0)
Monocytes Absolute: 0.1 10*3/uL (ref 0.1–1.0)
Monocytes Relative: 3 %
Neutro Abs: 4.8 10*3/uL (ref 1.7–7.7)
Neutrophils Relative %: 88 %
Platelet Count: 143 10*3/uL — ABNORMAL LOW (ref 150–400)
RBC: 4.35 MIL/uL (ref 3.87–5.11)
RDW: 11.7 % (ref 11.5–15.5)
WBC Count: 5.4 10*3/uL (ref 4.0–10.5)
nRBC: 0 % (ref 0.0–0.2)

## 2021-01-30 LAB — COMPREHENSIVE METABOLIC PANEL
ALT: 19 U/L (ref 0–35)
AST: 31 U/L (ref 0–37)
Albumin: 4.2 g/dL (ref 3.5–5.2)
Alkaline Phosphatase: 45 U/L (ref 39–117)
BUN: 17 mg/dL (ref 6–23)
CO2: 25 mEq/L (ref 19–32)
Calcium: 9.5 mg/dL (ref 8.4–10.5)
Chloride: 106 mEq/L (ref 96–112)
Creatinine, Ser: 0.58 mg/dL (ref 0.40–1.20)
GFR: 94.14 mL/min (ref >60.00)
Glucose, Bld: 122 mg/dL — ABNORMAL HIGH (ref 70–99)
Potassium: 5.3 mEq/L — ABNORMAL HIGH (ref 3.5–5.1)
Sodium: 140 mEq/L (ref 135–145)
Total Bilirubin: 0.5 mg/dL (ref 0.2–1.2)
Total Protein: 7.9 g/dL (ref 6.0–8.3)

## 2021-01-30 LAB — LIPID PANEL
Cholesterol: 208 mg/dL — ABNORMAL HIGH (ref 0–200)
HDL: 62.3 mg/dL (ref >39.00)
LDL Cholesterol: 130 mg/dL — ABNORMAL HIGH (ref 0–99)
NonHDL: 146
Total CHOL/HDL Ratio: 3
Triglycerides: 80 mg/dL (ref 0.0–149.0)
VLDL: 16 mg/dL (ref 0.0–40.0)

## 2021-01-30 LAB — CMP (CANCER CENTER ONLY)
ALT: 17 U/L (ref 0–44)
AST: 24 U/L (ref 15–41)
Albumin: 4 g/dL (ref 3.5–5.0)
Alkaline Phosphatase: 46 U/L (ref 38–126)
Anion gap: 9 (ref 5–15)
BUN: 18 mg/dL (ref 8–23)
CO2: 24 mmol/L (ref 22–32)
Calcium: 9.8 mg/dL (ref 8.9–10.3)
Chloride: 107 mmol/L (ref 98–111)
Creatinine: 0.66 mg/dL (ref 0.44–1.00)
GFR, Estimated: >60 mL/min (ref >60)
Glucose, Bld: 135 mg/dL — ABNORMAL HIGH (ref 70–99)
Potassium: 4.1 mmol/L (ref 3.5–5.1)
Sodium: 140 mmol/L (ref 135–145)
Total Bilirubin: 0.4 mg/dL (ref 0.3–1.2)
Total Protein: 8 g/dL (ref 6.5–8.1)

## 2021-01-30 LAB — HEMOGLOBIN A1C: Hgb A1c MFr Bld: 5.6 % (ref 4.6–6.5)

## 2021-01-30 LAB — LACTATE DEHYDROGENASE: LDH: 219 U/L — ABNORMAL HIGH (ref 98–192)

## 2021-01-30 LAB — TSH: TSH: 1.07 u[IU]/mL (ref 0.35–4.50)

## 2021-01-30 LAB — VITAMIN D 25 HYDROXY (VIT D DEFICIENCY, FRACTURES): VITD: 35.08 ng/mL (ref 30.00–100.00)

## 2021-01-30 NOTE — Progress Notes (Signed)
Hematology and Oncology Follow Up Visit  KARYN BRULL [id number] 03/16/1956 65 y.o. 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 Grantham is here today for follow-up.  She really looks quite good.  She feels good.  She had little bit of diarrhea last week.  She took a little Pepto-Bismol for this.  Only last saw her back in January, her myeloma studies were relatively stable.  Her monoclonal spike was 1 g/dL.  Her IgG level was 1600 mg/dL.  The Kappa light chain was up a little bit at 3.5 mg/dL.  She has had no headache.  She has had no cough or shortness of breath.  She has had no nausea or vomiting.  There is been no leg swelling.  She show me a lot of pictures of her family.  Apparently, her son owns quite a few cars.  He just bought an RV.  Overall, her performance status is ECOG 1.       Medications:  Allergies as of 01/30/2021      Reactions   Benazepril Anaphylaxis, Swelling, Hives   angioedema Throat and lip swelling   Ondansetron Hcl Hives   Redness and hives post IV admin on 07/05/17   Codeine Nausea And Vomiting   Morphine Hives   Redness and hives noted post IV admin on 07/05/17      Medication List       Accurate as of January 30, 2021  8:24 AM. If you have any questions, ask your nurse or doctor.        acetaminophen 500 MG tablet Commonly known as: TYLENOL Take 500 mg by mouth every 6 (six) hours as needed.   ACETAMINOPHEN-BUTALBITAL 50-325 MG Tabs Take 1 tablet by mouth 3 (three) times daily as needed.   albuterol 108 (90 Base) MCG/ACT inhaler Commonly known as: VENTOLIN HFA Inhale  2 puffs into the lungs every 6 (six) hours as needed for wheezing or shortness of breath.   amoxicillin-clavulanate 875-125 MG tablet Commonly known as: Augmentin Take 1 tablet by mouth 2 (two) times daily.   aspirin EC 81 MG tablet Take 1 tablet (81 mg total) by mouth daily.   budesonide-formoterol 160-4.5 MCG/ACT inhaler Commonly known as: SYMBICORT Inhale 2 puffs into the lungs 2 (two) times daily.   carvedilol 25 MG tablet Commonly known as: COREG Take 1 tablet (25 mg total) by mouth 2 (two) times daily with a meal.   chlorpheniramine-HYDROcodone 10-8 MG/5ML Suer Commonly known as: Tussionex Pennkinetic ER Take 5 mLs by mouth every 12 (twelve) hours as needed for cough.   conjugated estrogens vaginal cream Commonly known as: PREMARIN Place 1 Applicatorful vaginally as needed.   cyclobenzaprine 10 MG tablet Commonly known as: FLEXERIL Take 1 tablet (10 mg total) by mouth at bedtime.   famotidine 40 MG tablet Commonly known as: PEPCID Take 40 mg by mouth 2 (two) times daily.   fluticasone 50 MCG/ACT nasal spray Commonly known as: FLONASE Place 2 sprays into both nostrils daily.   furosemide 20 MG tablet Commonly known as: LASIX Take 1 tablet by mouth as directed.  guaiFENesin 600 MG 12 hr tablet Commonly known as: MUCINEX Take 600 mg by mouth 2 (two) times daily.   losartan 25 MG tablet Commonly known as: COZAAR Take 25 mg by mouth. Take two tablets, total of 50 mg daily   magnesium oxide 400 MG tablet Commonly known as: MAG-OX Take 1 tablet (400 mg total) by mouth 2 (two) times daily.   meclizine 12.5 MG tablet Commonly known as: ANTIVERT Take 12.5 mg by mouth 3 (three) times daily.   melatonin 3 MG Tabs tablet Take 3 mg by mouth at bedtime.   montelukast 10 MG tablet Commonly known as: Singulair Take 1 tablet (10 mg total) by mouth at bedtime. Start taking Singulair on 11/16/2019.   MULTIVITAMIN ADULT PO Take 1 tablet by mouth daily.    pantoprazole 40 MG tablet Commonly known as: PROTONIX Take 1 tablet (40 mg total) by mouth 2 (two) times daily.   PARoxetine 20 MG tablet Commonly known as: PAXIL Take 20 mg by mouth every morning.   predniSONE 10 MG (21) Tbpk tablet Commonly known as: STERAPRED UNI-PAK 21 TAB Standard taper over 6 days.   promethazine 25 MG tablet Commonly known as: PHENERGAN Take 1 tablet (25 mg total) by mouth every 6 (six) hours as needed for nausea or vomiting.   spironolactone 25 MG tablet Commonly known as: ALDACTONE Take 1 tablet by mouth daily.   sucralfate 1 g tablet Commonly known as: Carafate Take 1 tablet (1 g total) by mouth 3 (three) times daily as needed. prn   topiramate 50 MG tablet Commonly known as: Topamax Take 1 tablet (50 mg total) by mouth 2 (two) times daily.   Vitamin D-3 25 MCG (1000 UT) Caps Take 1 capsule (1,000 Units total) by mouth daily.       Allergies:  Allergies  Allergen Reactions  . Benazepril Anaphylaxis, Swelling and Hives    angioedema Throat and lip swelling  . Ondansetron Hcl Hives    Redness and hives post IV admin on 07/05/17  . Codeine Nausea And Vomiting  . Morphine Hives    Redness and hives noted post IV admin on 07/05/17    Past Medical History, Surgical history, Social history, and Family History were reviewed and updated.  Review of Systems: Review of Systems  Constitutional: Negative.   HENT: Negative.   Eyes: Negative.   Respiratory: Negative.   Cardiovascular: Negative.   Gastrointestinal: Negative.   Genitourinary: Negative.   Musculoskeletal: Negative.   Skin: Negative.   Neurological: Negative.   Endo/Heme/Allergies: Negative.   Psychiatric/Behavioral: Negative.       Physical Exam:  vitals were not taken for this visit.   Wt Readings from Last 3 Encounters:  01/29/21 205 lb 6.4 oz (93.2 kg)  01/06/21 203 lb 6 oz (92.3 kg)  12/19/20 203 lb (92.1 kg)    Physical Exam Vitals reviewed.  HENT:      Head: Normocephalic and atraumatic.  Eyes:     Pupils: Pupils are equal, round, and reactive to light.  Cardiovascular:     Rate and Rhythm: Normal rate and regular rhythm.     Heart sounds: Normal heart sounds.  Pulmonary:     Effort: Pulmonary effort is normal.     Breath sounds: Normal breath sounds.  Abdominal:     General: Bowel sounds are normal.     Palpations: Abdomen is soft.  Musculoskeletal:        General: No tenderness or deformity. Normal range of motion.  Cervical back: Normal range of motion.  Lymphadenopathy:     Cervical: No cervical adenopathy.  Skin:    General: Skin is warm and dry.     Findings: No erythema or rash.  Neurological:     Mental Status: She is alert and oriented to person, place, and time.  Psychiatric:        Behavior: Behavior normal.        Thought Content: Thought content normal.        Judgment: Judgment normal.      Lab Results  Component Value Date   WBC 5.4 01/30/2021   HGB 13.1 01/30/2021   HCT 39.4 01/30/2021   MCV 90.6 01/30/2021   PLT 143 (L) 01/30/2021   Lab Results  Component Value Date   FERRITIN 2,483 (H) 08/26/2020   IRON 124 08/26/2020   TIBC 284 08/26/2020   UIBC 160 08/26/2020   IRONPCTSAT 44 08/26/2020   Lab Results  Component Value Date   RETICCTPCT 0.8 07/16/2016   RBC 4.35 01/30/2021   RETICCTABS 32,640 07/16/2016   Lab Results  Component Value Date   KPAFRELGTCHN 35.6 (H) 12/19/2020   LAMBDASER 6.5 12/19/2020   KAPLAMBRATIO 5.48 (H) 12/19/2020   Lab Results  Component Value Date   IGGSERUM 1,529 12/19/2020   IGA 60 (L) 12/19/2020   IGMSERUM 45 12/19/2020   Lab Results  Component Value Date   TOTALPROTELP 6.9 12/19/2020   ALBUMINELP 3.3 12/19/2020   A1GS 0.3 12/19/2020   A2GS 1.0 12/19/2020   BETS 1.0 12/19/2020   BETA2SER 0.5 07/16/2016   GAMS 1.3 12/19/2020   MSPIKE 1.0 (H) 12/19/2020   SPEI Comment 12/19/2020     Chemistry      Component Value Date/Time   NA 142 12/19/2020  0946   NA 143 11/08/2017 1127   NA 140 12/31/2016 1000   K 4.0 12/19/2020 0946   K 4.0 11/08/2017 1127   K 3.6 12/31/2016 1000   CL 108 12/19/2020 0946   CL 107 11/08/2017 1127   CO2 28 12/19/2020 0946   CO2 25 11/08/2017 1127   CO2 24 12/31/2016 1000   BUN 15 12/19/2020 0946   BUN 19 11/08/2017 1127   BUN 22.3 12/31/2016 1000   CREATININE 0.81 12/19/2020 0946   CREATININE 0.9 11/08/2017 1127   CREATININE 0.8 12/31/2016 1000      Component Value Date/Time   CALCIUM 9.5 12/19/2020 0946   CALCIUM 9.9 11/08/2017 1127   CALCIUM 9.5 12/31/2016 1000   ALKPHOS 43 12/19/2020 0946   ALKPHOS 44 11/08/2017 1127   ALKPHOS 51 12/31/2016 1000   AST 26 12/19/2020 0946   AST 28 12/31/2016 1000   ALT 17 12/19/2020 0946   ALT 36 11/08/2017 1127   ALT 16 12/31/2016 1000   BILITOT 0.4 12/19/2020 0946   BILITOT 0.46 12/31/2016 1000       Impression and Plan: Ms. Hlad is a very pleasant 65 yo African American female with a previous IgG kappa MGUS with progression to myeloma.   So far, she been off treatment now for about 4 months.  Hopefully, her monoclonal studies will be stable.  We will plan to get her back in another 2 months.  I think this would be reasonable.   [name], MD 3/24/20228:24 AM

## 2021-01-31 LAB — IGG, IGA, IGM
IgA: 73 mg/dL — ABNORMAL LOW (ref 87–352)
IgG (Immunoglobin G), Serum: 1847 mg/dL — ABNORMAL HIGH (ref 586–1602)
IgM (Immunoglobulin M), Srm: 56 mg/dL (ref 26–217)

## 2021-01-31 LAB — KAPPA/LAMBDA LIGHT CHAINS
Kappa free light chain: 26.6 mg/L — ABNORMAL HIGH (ref 3.3–19.4)
Kappa, lambda light chain ratio: 13.3 — ABNORMAL HIGH (ref 0.26–1.65)
Lambda free light chains: 2 mg/L — ABNORMAL LOW (ref 5.7–26.3)

## 2021-02-03 ENCOUNTER — Ambulatory Visit: Payer: Medicare Other | Admitting: Medical

## 2021-02-05 LAB — IMMUNOFIXATION REFLEX, SERUM
IgA: 74 mg/dL — ABNORMAL LOW (ref 87–352)
IgG (Immunoglobin G), Serum: 2003 mg/dL — ABNORMAL HIGH (ref 586–1602)
IgM (Immunoglobulin M), Srm: 56 mg/dL (ref 26–217)

## 2021-02-05 LAB — PROTEIN ELECTROPHORESIS, SERUM, WITH REFLEX
A/G Ratio: 0.9 (ref 0.7–1.7)
Albumin ELP: 3.5 g/dL (ref 2.9–4.4)
Alpha-1-Globulin: 0.3 g/dL (ref 0.0–0.4)
Alpha-2-Globulin: 1.1 g/dL — ABNORMAL HIGH (ref 0.4–1.0)
Beta Globulin: 1 g/dL (ref 0.7–1.3)
Gamma Globulin: 1.6 g/dL (ref 0.4–1.8)
Globulin, Total: 3.9 g/dL (ref 2.2–3.9)
M-Spike, %: 1.1 g/dL — ABNORMAL HIGH
SPEP Interpretation: 0
Total Protein ELP: 7.4 g/dL (ref 6.0–8.5)

## 2021-02-13 ENCOUNTER — Ambulatory Visit: Payer: Medicare Other | Admitting: Family Medicine

## 2021-02-13 ENCOUNTER — Telehealth: Payer: Self-pay | Admitting: *Deleted

## 2021-02-13 NOTE — Telephone Encounter (Signed)
May 17 at 3:20

## 2021-02-13 NOTE — Telephone Encounter (Signed)
Called patient to discuss appt today to try and change to virtual and did not want to and wanted to change to [facility].  She wants to see you later in month cause she just followed up with her cardiologist and has questions.  Do you have a good spot to put her.  She will take a late evening (in person visit)?  Also can she do her follow up at that time since she is a little complicated?

## 2021-02-14 ENCOUNTER — Other Ambulatory Visit: Payer: Self-pay

## 2021-02-14 ENCOUNTER — Ambulatory Visit (INDEPENDENT_AMBULATORY_CARE_PROVIDER_SITE_OTHER): Payer: Medicare Other | Admitting: Family

## 2021-02-14 VITALS — BP 150/93 | HR 94 | Temp 98.3°F | Resp 16 | Ht 66.0 in | Wt 203.0 lb

## 2021-02-14 DIAGNOSIS — J019 Acute sinusitis, unspecified: Secondary | ICD-10-CM | POA: Diagnosis not present

## 2021-02-14 DIAGNOSIS — J302 Other seasonal allergic rhinitis: Secondary | ICD-10-CM

## 2021-02-14 DIAGNOSIS — H9202 Otalgia, left ear: Secondary | ICD-10-CM | POA: Diagnosis not present

## 2021-02-14 DIAGNOSIS — R0981 Nasal congestion: Secondary | ICD-10-CM | POA: Diagnosis not present

## 2021-02-14 MED ORDER — MONTELUKAST SODIUM 10 MG PO TABS: 10.0000 mg | ORAL_TABLET | Freq: Every day | ORAL | 3 refills | Status: DC

## 2021-02-14 NOTE — Telephone Encounter (Signed)
Patient was ok with that day.  Patient scheduled.

## 2021-02-14 NOTE — Patient Instructions (Addendum)
Continue zyrtec 10mg  once daily. Add singulair 10mg  once daily. Use saline spray every 6 hrs as needed. You can add mucinex 600mg  twice daily as needed for congestion.  Call if you develop fever, if symptoms worsen, or if no improvement in 3-4 days

## 2021-02-14 NOTE — Assessment & Plan Note (Addendum)
Uncontrolled.  Having watery eyes, increased nasal congestion x 4-5 days. Advised pt to continue zyrtec 10mg  once daily and flonase 2 sprays to each nostril once daily and to add trial of singulair 10mg  once daily. For congestion add nasal saline spray every 6 hrs and mucinex 600mg  q12hrs prn.  Pt was upset that I did not give her an antibiotic today.  I see that she did have an antibiotic a few weeks ago due to otalgia.  We discussed that if symptoms worsen or if not improved in 3-4 days could consider antibiotic therapy, but at this time does not appear to be an bacterial infection.  Pt verbalizes understanding.   I also swabbed her for Covid-19 today.

## 2021-02-14 NOTE — Progress Notes (Signed)
Subjective:   By signing my name below, I, Lucille Passy, attest that this documentation has been prepared under the direction and in the presence of Debbrah Alar. 02/14/2021   [name], female    DOB: January 15, 1956, 65 y.o.   MRN: [id number]  Chief Complaint  Patient presents with  . Nasal Congestion    Patient here for "head cold symptoms", sneezing, congestion and watery left eye    HPI Patient is in today for an office visit complaining of sinus congestion, left ear pain, left eye watering, and cough x 4 days. She denies any fever, sore throat, shortness of breath, and headache at this time. She took an at home COVID-19 test which was negative. She denies any loss of taste or smell.  She often struggles with seasonal allergies around this time and she is currently taking Zyrtec daily which has helped her symptoms. She also takes flonase nasal spray. She denies having any itchy eyes.  Past Medical History:  Diagnosis Date  . Abnormal SPEP 07/26/2016  . Arthritis    knees, hands  . Back pain 07/14/2016  . Bowel obstruction ([place]) 11/2014  . C. difficile diarrhea   . CHF (congestive heart failure) ([place])   . Colitis   . Congestive heart failure ([place]) 02/24/2015   S/p pacemaker  . Depression   . Elevated sed rate 08/15/2017  . Epigastric pain 03/22/2017  . GERD (gastroesophageal reflux disease)   . Heart disease   . Hyperlipidemia   . Hypertension   . 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 Low back pain 07/14/2016  . 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 Multiple myeloma ([place]) 07/20/2018  . Multiple myeloma not having achieved remission ([place]) 07/20/2018  . Myalgia 12/31/2016  . Obstructive sleep apnea 03/31/2015  . SOB (shortness of breath) 04/09/2016  . Stroke ([place])    TIAs  . UTI (urinary tract infection)     Past Surgical History:  Procedure Laterality Date  . ABDOMINAL HYSTERECTOMY     menorraghia, 2006, total  .  CHOLECYSTECTOMY    . COLONOSCOPY  2018   [facility] per patient  . ESOPHAGOGASTRODUODENOSCOPY  2018   [facility]   . KNEE SURGERY     right, repair torn torn cartialage  . PACEMAKER INSERTION  08/2014  . TONSILLECTOMY    . TUBAL LIGATION      Family History  Problem Relation Age of Onset  . Diabetes Mother   . Hypertension Mother   . Heart disease Mother        s/p 1 stent  . Hyperlipidemia Mother   . Arthritis Mother   . Cancer Father        COLON  . Colon cancer Father 8  . Irritable bowel syndrome Sister   . Hyperlipidemia Daughter   . Hypertension Daughter   . Hypertension Maternal Grandmother   . Arthritis Maternal Grandmother   . Heart disease Maternal Grandfather        MI  . Hypertension Maternal Grandfather   . Arthritis Maternal Grandfather   . Hypertension Son   . Esophageal cancer Neg Hx     Social History   Socioeconomic History  . Marital status: Divorced    Spouse name: [name]  . Number of children: 3  . Years of education: [name]  . Highest education level: High school graduate  Occupational History  . [name]  Tobacco Use  . Smoking  status: Never Smoker  . Smokeless tobacco: Never Used  Vaping Use  . Vaping Use: Never used  Substance and Sexual Activity  . Alcohol use: No  . Drug use: No  . Sexual activity: [name]  Other Topics Concern  . [name]  Social History Narrative   Lives at home alone   Retired   Caffeine: coffee   Social Determinants of Radio broadcast assistant Strain: [name]  Food Insecurity: [name]  Transportation Needs: [name]  Physical Activity: [name]  Stress: [name]  Social Connections: [name]  Intimate Partner Violence: [name]    Outpatient Medications Prior to Visit  Medication Sig Dispense Refill  . acetaminophen (TYLENOL) 500 MG tablet Take 500 mg by mouth every 6 (six) hours as needed.    . ACETAMINOPHEN-BUTALBITAL  50-325 MG TABS Take 1 tablet by mouth 3 (three) times daily as needed. 60 tablet 0  . albuterol (VENTOLIN HFA) 108 (90 Base) MCG/ACT inhaler Inhale 2 puffs into the lungs every 6 (six) hours as needed for wheezing or shortness of breath. 18 g 5  . amoxicillin-clavulanate (AUGMENTIN) 875-125 MG tablet Take 1 tablet by mouth 2 (two) times daily. 20 tablet 0  . aspirin EC 81 MG tablet Take 1 tablet (81 mg total) by mouth daily. 90 tablet 1  . budesonide-formoterol (SYMBICORT) 160-4.5 MCG/ACT inhaler Inhale 2 puffs into the lungs 2 (two) times daily. 1 each 3  . carvedilol (COREG) 25 MG tablet Take 1 tablet (25 mg total) by mouth 2 (two) times daily with a meal. 180 tablet 1  . chlorpheniramine-HYDROcodone (TUSSIONEX PENNKINETIC ER) 10-8 MG/5ML SUER Take 5 mLs by mouth every 12 (twelve) hours as needed for cough. 140 mL 0  . Cholecalciferol (VITAMIN D-3) 25 MCG (1000 UT) CAPS Take 1 capsule (1,000 Units total) by mouth daily. 60 capsule 2  . conjugated estrogens (PREMARIN) vaginal cream Place 1 Applicatorful vaginally as needed.     . cyclobenzaprine (FLEXERIL) 10 MG tablet Take 1 tablet (10 mg total) by mouth at bedtime. 7 tablet 0  . famotidine (PEPCID) 40 MG tablet Take 40 mg by mouth 2 (two) times daily.    . fluticasone (FLONASE) 50 MCG/ACT nasal spray Place 2 sprays into both nostrils daily. 16 g 0  . furosemide (LASIX) 20 MG tablet Take 1 tablet by mouth as directed.    Marland Kitchen guaiFENesin (MUCINEX) 600 MG 12 hr tablet Take 600 mg by mouth 2 (two) times daily.    Marland Kitchen losartan (COZAAR) 25 MG tablet Take 25 mg by mouth. Take two tablets, total of 50 mg daily    . magnesium oxide (MAG-OX) 400 MG tablet Take 1 tablet (400 mg total) by mouth 2 (two) times daily. 60 tablet 1  . meclizine (ANTIVERT) 12.5 MG tablet Take 12.5 mg by mouth 3 (three) times daily.    . melatonin 3 MG TABS tablet Take 3 mg by mouth at bedtime.    . montelukast (SINGULAIR) 10 MG tablet Take 1 tablet (10 mg total) by mouth at bedtime.  Start taking Singulair on 11/16/2019. 20 tablet 0  . Multiple Vitamins-Minerals (MULTIVITAMIN ADULT PO) Take 1 tablet by mouth daily.    . pantoprazole (PROTONIX) 40 MG tablet Take 1 tablet (40 mg total) by mouth 2 (two) times daily. 180 tablet 1  . PARoxetine (PAXIL) 20 MG tablet Take 20 mg by mouth every morning.    . predniSONE (  STERAPRED UNI-PAK 21 TAB) 10 MG (21) TBPK tablet Standard taper over 6 days. 21 tablet 0  . promethazine (PHENERGAN) 25 MG tablet Take 1 tablet (25 mg total) by mouth every 6 (six) hours as needed for nausea or vomiting. 30 tablet 0  . spironolactone (ALDACTONE) 25 MG tablet Take 1 tablet by mouth daily.    . sucralfate (CARAFATE) 1 g tablet Take 1 tablet (1 g total) by mouth 3 (three) times daily as needed. prn 90 tablet 1  . topiramate (TOPAMAX) 50 MG tablet Take 1 tablet (50 mg total) by mouth 2 (two) times daily. 60 tablet 3   No facility-administered medications prior to visit.    Allergies  Allergen Reactions  . Benazepril Anaphylaxis, Swelling and Hives    angioedema Throat and lip swelling  . Ondansetron Hcl Hives    Redness and hives post IV admin on 07/05/17  . Codeine Nausea And Vomiting  . Morphine Hives    Redness and hives noted post IV admin on 07/05/17    Review of Systems  Constitutional: Negative for chills and fever.  HENT: Positive for congestion (nasal) and ear pain (left). Negative for sore throat.        (+)left eye watering  Eyes: Negative for blurred vision, pain and redness.  Respiratory: Positive for cough. Negative for shortness of breath.        Objective:    Physical Exam Constitutional:      General: She is not in acute distress.    Appearance: Normal appearance. She is not ill-appearing.  HENT:     Head: Normocephalic and atraumatic.     Right Ear: Tympanic membrane, ear canal and external ear normal.     Left Ear: Tympanic membrane, ear canal and external ear normal.  Eyes:     Extraocular Movements: Extraocular  movements intact.     Pupils: Pupils are equal, round, and reactive to light.  Neck:     Thyroid: No thyroid mass.  Cardiovascular:     Rate and Rhythm: Normal rate and regular rhythm.     Pulses: Normal pulses.     Heart sounds: Normal heart sounds. No murmur heard.   Pulmonary:     Effort: Pulmonary effort is normal. No respiratory distress.     Breath sounds: Normal breath sounds. No wheezing, rhonchi or rales.  Lymphadenopathy:     Cervical: No cervical adenopathy.  Skin:    General: Skin is warm and dry.  Neurological:     Mental Status: She is alert and oriented to person, place, and time.  Psychiatric:        Behavior: Behavior normal.     BP (!) 150/93 (BP Location: Right Arm, Patient Position: Sitting, Cuff Size: Large)   Pulse 94   Temp 98.3 F (36.8 C) (Oral)   Resp 16   Ht $R'5\' 6"'id$  (1.676 m)   Wt 203 lb (92.1 kg)   SpO2 99%   BMI 32.77 kg/m  Wt Readings from Last 3 Encounters:  02/14/21 203 lb (92.1 kg)  01/30/21 203 lb (92.1 kg)  01/29/21 205 lb 6.4 oz (93.2 kg)    Diabetic Foot Exam - Simple   No data filed    Lab Results  Component Value Date   WBC 5.6 01/30/2021   HGB 12.9 01/30/2021   HCT 38.7 01/30/2021   PLT 148.0 (L) 01/30/2021   GLUCOSE 122 (H) 01/30/2021   CHOL 208 (H) 01/30/2021   TRIG 80.0 01/30/2021   HDL 62.30  01/30/2021   LDLCALC 130 (H) 01/30/2021   ALT 19 01/30/2021   AST 31 01/30/2021   NA 140 01/30/2021   K 5.3 sample moderately hemolyzed (H) 01/30/2021   CL 106 01/30/2021   CREATININE 0.58 01/30/2021   BUN 17 01/30/2021   CO2 25 01/30/2021   TSH 1.07 01/30/2021   INR 0.90 07/28/2018   HGBA1C 5.6 01/30/2021    Lab Results  Component Value Date   TSH 1.07 01/30/2021   Lab Results  Component Value Date   WBC 5.6 01/30/2021   HGB 12.9 01/30/2021   HCT 38.7 01/30/2021   MCV 92.2 01/30/2021   PLT 148.0 (L) 01/30/2021   Lab Results  Component Value Date   NA 140 01/30/2021   K 5.3 sample moderately hemolyzed (H)  01/30/2021   CHLORIDE 108 12/31/2016   CO2 25 01/30/2021   GLUCOSE 122 (H) 01/30/2021   BUN 17 01/30/2021   CREATININE 0.58 01/30/2021   BILITOT 0.5 01/30/2021   ALKPHOS 45 01/30/2021   AST 31 01/30/2021   ALT 19 01/30/2021   PROT 7.9 01/30/2021   ALBUMIN 4.2 01/30/2021   CALCIUM 9.5 01/30/2021   ANIONGAP 9 01/30/2021   EGFR >90 12/31/2016   GFR 94.14 01/30/2021   Lab Results  Component Value Date   CHOL 208 (H) 01/30/2021   Lab Results  Component Value Date   HDL 62.30 01/30/2021   Lab Results  Component Value Date   LDLCALC 130 (H) 01/30/2021   Lab Results  Component Value Date   TRIG 80.0 01/30/2021   Lab Results  Component Value Date   CHOLHDL 3 01/30/2021   Lab Results  Component Value Date   HGBA1C 5.6 01/30/2021       Assessment & Plan:   Problem List Items Addressed This Visit   None     No orders of the defined types were placed in this encounter.   I, Lucille Passy, personally preformed the services described in this documentation.  All medical record entries made by the scribe were at my direction and in my presence.  I have reviewed the chart and discharge instructions (if applicable) and agree that the record reflects my personal performance and is accurate and complete. 02/14/2021  I,Alexis Bryant,acting as a scribe for Nance Pear, NP.,have documented all relevant documentation on the behalf of Nance Pear, NP,as directed by  Nance Pear, NP while in the presence of Nance Pear, NP.  Lucille Passy

## 2021-02-15 LAB — SARS-COV-2, NAA 2 DAY TAT

## 2021-02-15 LAB — NOVEL CORONAVIRUS, NAA: SARS-CoV-2, NAA: NOT DETECTED

## 2021-03-05 DIAGNOSIS — G4733 Obstructive sleep apnea (adult) (pediatric): Secondary | ICD-10-CM | POA: Diagnosis not present

## 2021-03-11 DIAGNOSIS — R21 Rash and other nonspecific skin eruption: Secondary | ICD-10-CM | POA: Diagnosis not present

## 2021-03-13 ENCOUNTER — Encounter: Payer: Self-pay | Admitting: *Deleted

## 2021-03-24 DIAGNOSIS — K140 Glossitis: Secondary | ICD-10-CM | POA: Diagnosis not present

## 2021-03-25 ENCOUNTER — Ambulatory Visit: Payer: Medicare Other | Admitting: Family Medicine

## 2021-03-27 ENCOUNTER — Ambulatory Visit: Payer: Medicare Other | Admitting: Family Medicine

## 2021-03-28 DIAGNOSIS — Z1231 Encounter for screening mammogram for malignant neoplasm of breast: Secondary | ICD-10-CM | POA: Diagnosis not present

## 2021-03-28 LAB — HM MAMMOGRAPHY

## 2021-03-31 ENCOUNTER — Encounter: Payer: Self-pay | Admitting: *Deleted

## 2021-03-31 DIAGNOSIS — Z20822 Contact with and (suspected) exposure to covid-19: Secondary | ICD-10-CM | POA: Diagnosis not present

## 2021-04-01 ENCOUNTER — Encounter: Payer: Self-pay | Admitting: Family Medicine

## 2021-04-01 ENCOUNTER — Other Ambulatory Visit: Payer: Self-pay

## 2021-04-01 ENCOUNTER — Ambulatory Visit (INDEPENDENT_AMBULATORY_CARE_PROVIDER_SITE_OTHER): Payer: Medicare Other | Admitting: Family Medicine

## 2021-04-01 DIAGNOSIS — R7989 Other specified abnormal findings of blood chemistry: Secondary | ICD-10-CM | POA: Diagnosis not present

## 2021-04-01 DIAGNOSIS — Z8616 Personal history of COVID-19: Secondary | ICD-10-CM | POA: Diagnosis not present

## 2021-04-01 DIAGNOSIS — R790 Abnormal level of blood mineral: Secondary | ICD-10-CM | POA: Diagnosis not present

## 2021-04-01 DIAGNOSIS — K047 Periapical abscess without sinus: Secondary | ICD-10-CM | POA: Insufficient documentation

## 2021-04-01 DIAGNOSIS — I10 Essential (primary) hypertension: Secondary | ICD-10-CM

## 2021-04-01 DIAGNOSIS — R739 Hyperglycemia, unspecified: Secondary | ICD-10-CM | POA: Diagnosis not present

## 2021-04-01 DIAGNOSIS — C9000 Multiple myeloma not having achieved remission: Secondary | ICD-10-CM

## 2021-04-01 MED ORDER — HYDROCOD POLST-CPM POLST ER 10-8 MG/5ML PO SUER: 5.0000 mL | Freq: Two times a day (BID) | ORAL | 0 refills | Status: DC | PRN

## 2021-04-01 NOTE — Progress Notes (Signed)
[name], female    DOB: Mar 23, 1956  Age: 65 y.o. MRN: [id number]    Subjective:  Subjective  HPI Tami [name] presents for office visit today for follow up on HTN and C. Difficile colitis. She reports that she is feeling well, but she states that she is a little bit sluggish. She denies any chest pain, SOB, fever, abdominal pain, cough, chills, sore throat, dysuria, urinary incontinence, back pain, HA, or N/V. She reports that she recently went to the dentist to get teeth fillings for both top and bottom teeth. However, she states that she was told by the dentist that she needs a root canal for the bottom tooth which she has scheduled late April. She reports that the dentist also found an ulcer on her tongue which she was px Anbesol for pain relief. She states that the ulcer is getting better. She states that the ulcer was around the edges of the tongue and some in the middle. As a result of the fillings and tongue ulcer, she states that her appetite has reduced.  She reports that she has been experiencing diarrhea 3x a day that she describes as being loose, soft, and with no blood in BM's.   Review of Systems  Constitutional: Negative for chills, fatigue and fever.  HENT: Negative for congestion, rhinorrhea, sinus pressure, sinus pain and sore throat.        (+) tongue ulcer  Eyes: Negative for pain.  Respiratory: Negative for cough and shortness of breath.   Cardiovascular: Negative for chest pain, palpitations and leg swelling.  Gastrointestinal: Positive for diarrhea (loose, no blood, 3x a day). Negative for abdominal pain, blood in stool, nausea and vomiting.  Genitourinary: Negative for decreased urine volume, flank pain, frequency, vaginal bleeding and vaginal discharge.  Musculoskeletal: Negative for back pain.  Neurological: Negative for headaches.    History Past Medical History:  Diagnosis Date  . Abnormal SPEP 07/26/2016  . Arthritis    knees, hands  .  Back pain 07/14/2016  . Bowel obstruction ([place]) 11/2014  . C. difficile diarrhea   . CHF (congestive heart failure) ([place])   . Colitis   . Congestive heart failure ([place]) 02/24/2015   S/p pacemaker  . Depression   . Elevated sed rate 08/15/2017  . Epigastric pain 03/22/2017  . GERD (gastroesophageal reflux disease)   . Heart disease   . Hyperlipidemia   . Hypertension   . 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 Low back pain 07/14/2016  . 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 Multiple myeloma ([place]) 07/20/2018  . Multiple myeloma not having achieved remission ([place]) 07/20/2018  . Myalgia 12/31/2016  . Obstructive sleep apnea 03/31/2015  . SOB (shortness of breath) 04/09/2016  . Stroke ([place])    TIAs  . UTI (urinary tract infection)     She has a past surgical history that includes Pacemaker insertion (08/2014); Cholecystectomy; Tubal ligation; Knee surgery; Abdominal hysterectomy; Tonsillectomy; Colonoscopy (2018); and Esophagogastroduodenoscopy (2018).   Her family history includes Arthritis in her maternal grandfather, maternal grandmother, and mother; Cancer in her father; Colon cancer (age of onset: 4) in her father; Diabetes in her mother; Heart disease in her maternal grandfather and mother; Hyperlipidemia in her daughter and mother; Hypertension in her daughter, maternal grandfather, maternal grandmother, mother, and son; Irritable bowel syndrome in her sister.She reports that she has never smoked. She has never used smokeless tobacco. She reports that she does not drink alcohol and does not use  drugs.  Current Outpatient Medications on File Prior to Visit  Medication Sig Dispense Refill  . acetaminophen (TYLENOL) 500 MG tablet Take 500 mg by mouth every 6 (six) hours as needed.    . ACETAMINOPHEN-BUTALBITAL 50-325 MG TABS Take 1 tablet by mouth 3 (three) times daily as needed. 60 tablet 0  . albuterol (VENTOLIN HFA) 108 (90 Base) MCG/ACT inhaler Inhale 2 puffs  into the lungs every 6 (six) hours as needed for wheezing or shortness of breath. 18 g 5  . aspirin EC 81 MG tablet Take 1 tablet (81 mg total) by mouth daily. 90 tablet 1  . budesonide-formoterol (SYMBICORT) 160-4.5 MCG/ACT inhaler Inhale 2 puffs into the lungs 2 (two) times daily. 1 each 3  . carvedilol (COREG) 25 MG tablet Take 1 tablet (25 mg total) by mouth 2 (two) times daily with a meal. 180 tablet 1  . Cholecalciferol (VITAMIN D-3) 25 MCG (1000 UT) CAPS Take 1 capsule (1,000 Units total) by mouth daily. 60 capsule 2  . cyclobenzaprine (FLEXERIL) 10 MG tablet Take 1 tablet (10 mg total) by mouth at bedtime. 7 tablet 0  . famotidine (PEPCID) 40 MG tablet Take 40 mg by mouth 2 (two) times daily.    . fluticasone (FLONASE) 50 MCG/ACT nasal spray Place 2 sprays into both nostrils daily. 16 g 0  . furosemide (LASIX) 20 MG tablet Take 1 tablet by mouth as directed.    Marland Kitchen guaiFENesin (MUCINEX) 600 MG 12 hr tablet Take 600 mg by mouth 2 (two) times daily.    Marland Kitchen losartan (COZAAR) 25 MG tablet Take 25 mg by mouth. Take two tablets, total of 50 mg daily    . magnesium oxide (MAG-OX) 400 MG tablet Take 1 tablet (400 mg total) by mouth 2 (two) times daily. 60 tablet 1  . meclizine (ANTIVERT) 12.5 MG tablet Take 12.5 mg by mouth 3 (three) times daily.    . melatonin 3 MG TABS tablet Take 3 mg by mouth at bedtime.    . montelukast (SINGULAIR) 10 MG tablet Take 1 tablet (10 mg total) by mouth at bedtime. 30 tablet 3  . Multiple Vitamins-Minerals (MULTIVITAMIN ADULT PO) Take 1 tablet by mouth daily.    . pantoprazole (PROTONIX) 40 MG tablet Take 1 tablet (40 mg total) by mouth 2 (two) times daily. 180 tablet 1  . promethazine (PHENERGAN) 25 MG tablet Take 1 tablet (25 mg total) by mouth every 6 (six) hours as needed for nausea or vomiting. 30 tablet 0  . spironolactone (ALDACTONE) 25 MG tablet Take 1 tablet by mouth daily.    . sucralfate (CARAFATE) 1 g tablet Take 1 tablet (1 g total) by mouth 3 (three)  times daily as needed. prn 90 tablet 1  . topiramate (TOPAMAX) 50 MG tablet Take 1 tablet (50 mg total) by mouth 2 (two) times daily. 60 tablet 3  . amoxicillin (AMOXIL) 500 MG capsule Take 500 mg by mouth 3 (three) times daily.    . [DISCONTINUED] prochlorperazine (COMPAZINE) 10 MG tablet Take 1 tablet (10 mg total) by mouth every 6 (six) hours as needed (Nausea or vomiting). 30 tablet 1   No current facility-administered medications on file prior to visit.     Objective:  Objective  Physical Exam Constitutional:      General: She is not in acute distress.    Appearance: Normal appearance. She is not ill-appearing or toxic-appearing.  HENT:     Head: Normocephalic and atraumatic.     Right Ear:  Tympanic membrane, ear canal and external ear normal.     Left Ear: Tympanic membrane, ear canal and external ear normal.     Nose: No congestion or rhinorrhea.  Eyes:     Extraocular Movements: Extraocular movements intact.     Pupils: Pupils are equal, round, and reactive to light.  Cardiovascular:     Rate and Rhythm: Normal rate and regular rhythm.     Pulses: Normal pulses.     Heart sounds: Normal heart sounds. No murmur heard.   Pulmonary:     Effort: Pulmonary effort is normal. No respiratory distress.     Breath sounds: Normal breath sounds. No wheezing, rhonchi or rales.  Abdominal:     General: Bowel sounds are normal.     Palpations: Abdomen is soft. There is no mass.     Tenderness: There is no abdominal tenderness. There is no guarding.     Hernia: No hernia is present.  Musculoskeletal:        General: Normal range of motion.     Cervical back: Normal range of motion and neck supple.  Skin:    General: Skin is warm and dry.  Neurological:     Mental Status: She is alert and oriented to person, place, and time.  Psychiatric:        Behavior: Behavior normal.    BP 132/80   Pulse 98   Temp 97.9 F (36.6 C)   Resp 16   Wt 200 lb 8 oz (90.9 kg)   SpO2 99%   BMI  32.36 kg/m  Wt Readings from Last 3 Encounters:  04/01/21 200 lb 8 oz (90.9 kg)  02/25/21 201 lb (91.2 kg)  02/14/21 203 lb (92.1 kg)     Lab Results  Component Value Date   WBC 5.6 01/30/2021   HGB 12.9 01/30/2021   HCT 38.7 01/30/2021   PLT 148.0 (L) 01/30/2021   GLUCOSE 122 (H) 01/30/2021   CHOL 208 (H) 01/30/2021   TRIG 80.0 01/30/2021   HDL 62.30 01/30/2021   LDLCALC 130 (H) 01/30/2021   ALT 19 01/30/2021   AST 31 01/30/2021   NA 140 01/30/2021   K 5.3 sample moderately hemolyzed (H) 01/30/2021   CL 106 01/30/2021   CREATININE 0.58 01/30/2021   BUN 17 01/30/2021   CO2 25 01/30/2021   TSH 1.07 01/30/2021   INR 0.90 07/28/2018   HGBA1C 5.6 01/30/2021    DG Lumbar Spine Complete  Result Date: 09/18/2020 CLINICAL DATA:  Low back pain. EXAM: LUMBAR SPINE - COMPLETE 4+ VIEW COMPARISON:  CT abdomen and pelvis 08/10/2020. FINDINGS: No evidence of acute fracture. Vertebral body heights are maintained. Similar grade 1 anterolisthesis of L4 on L5. intervertebral disc are maintained. Lower lumbar facet arthropathy. Diffuse osteopenia. Right abdominal clips. IMPRESSION: No evidence of acute fracture or traumatic malalignment. Electronically Signed   By: Margaretha Sheffield MD   On: 09/18/2020 15:17     Assessment & Plan:  Plan    Meds ordered this encounter  Medications  . chlorpheniramine-HYDROcodone (TUSSIONEX PENNKINETIC ER) 10-8 MG/5ML SUER    Sig: Take 5 mLs by mouth every 12 (twelve) hours as needed for cough.    Dispense:  140 mL    Refill:  0    Problem List Items Addressed This Visit    Multiple myeloma ([place]) (Chronic)    She follows with oncology and is doing well      Relevant Medications   amoxicillin (AMOXIL) 500 MG capsule  Hypertension    Well controlled, no changes to meds. Encouraged heart healthy diet such as the DASH diet and exercise as tolerated.       Hyperglycemia    hgba1c acceptable, minimize simple carbs. Increase exercise as  tolerated.      Low magnesium level    Supplement and monitor      Low vitamin D level    Supplement and monitor      History of 2019 novel coronavirus disease (COVID-19)    She is improving but still notes fatigue and cough. Is given a refill on Tussionex which she uses infrequently       Dental infection    She is working with her dentist, they have her on Amoxicillin before they proceed with procedures.          Follow-up: Return in about 2 months (around 06/01/2021).   I,David Hanna,acting as a scribe for Penni Homans, MD.,have documented all relevant documentation on the behalf of Penni Homans, MD,as directed by  Penni Homans, MD while in the presence of Penni Homans, MD.  I, Mosie Lukes, MD personally performed the services described in this documentation. All medical record entries made by the scribe were at my direction and in my presence. I have reviewed the chart and agree that the record reflects my personal performance and is accurate and complete

## 2021-04-01 NOTE — Patient Instructions (Addendum)
[employer] probiotic take daily for next month and benefiber daily or twice daily  Diarrhea, Adult Diarrhea is frequent loose and watery bowel movements. Diarrhea can make you feel weak and cause you to become dehydrated. Dehydration can make you tired and thirsty, cause you to have a dry mouth, and decrease how often you urinate. Diarrhea typically lasts 2-3 days. However, it can last longer if it is a sign of something more serious. It is important to treat your diarrhea as told by your health care provider. Follow these instructions at home: Eating and drinking Follow these recommendations as told by your health care provider:  Take an oral rehydration solution (ORS). This is an over-the-counter medicine that helps return your body to its normal balance of nutrients and water. It is found at pharmacies and retail stores.  Drink plenty of fluids, such as water, ice chips, diluted fruit juice, and low-calorie sports drinks. You can drink milk also, if desired.  Avoid drinking fluids that contain a lot of sugar or caffeine, such as energy drinks, sports drinks, and soda.  Eat bland, easy-to-digest foods in small amounts as you are able. These foods include bananas, applesauce, rice, lean meats, toast, and crackers.  Avoid alcohol.  Avoid spicy or fatty foods.      Medicines  Take over-the-counter and prescription medicines only as told by your health care provider.  If you were prescribed an antibiotic medicine, take it as told by your health care provider. Do not stop using the antibiotic even if you start to feel better. General instructions  Wash your hands often using soap and water. If soap and water are not available, use a hand sanitizer. Others in the household should wash their hands as well. Hands should be washed: ? After using the toilet or changing a diaper. ? Before preparing, cooking, or serving food. ? While caring for a sick person or while visiting someone in a  hospital.  Drink enough fluid to keep your urine pale yellow.  Rest at home while you recover.  Watch your condition for any changes.  Take a warm bath to relieve any burning or pain from frequent diarrhea episodes.  Keep all follow-up visits as told by your health care provider. This is important.   Contact a health care provider if:  You have a fever.  Your diarrhea gets worse.  You have new symptoms.  You cannot keep fluids down.  You feel light-headed or dizzy.  You have a headache.  You have muscle cramps. Get help right away if:  You have chest pain.  You feel extremely weak or you faint.  You have bloody or black stools or stools that look like tar.  You have severe pain, cramping, or bloating in your abdomen.  You have trouble breathing or you are breathing very quickly.  Your heart is beating very quickly.  Your skin feels cold and clammy.  You feel confused.  You have signs of dehydration, such as: ? Dark urine, very little urine, or no urine. ? Cracked lips. ? Dry mouth. ? Sunken eyes. ? Sleepiness. ? Weakness. Summary  Diarrhea is frequent loose and watery bowel movements. Diarrhea can make you feel weak and cause you to become dehydrated.  Drink enough fluids to keep your urine pale yellow.  Make sure that you wash your hands after using the toilet. If soap and water are not available, use hand sanitizer.  Contact a health care provider if your diarrhea gets worse or  you have new symptoms.  Get help right away if you have signs of dehydration. This information is not intended to replace advice given to you by your health care provider. Make sure you discuss any questions you have with your health care provider. Document Revised: 03/14/2019 Document Reviewed: 04/01/2018 Elsevier Patient Education  2021 [employer].

## 2021-04-02 NOTE — Assessment & Plan Note (Signed)
Well controlled, no changes to meds. Encouraged heart healthy diet such as the DASH diet and exercise as tolerated.  °

## 2021-04-02 NOTE — Assessment & Plan Note (Signed)
Supplement and monitor 

## 2021-04-02 NOTE — Assessment & Plan Note (Signed)
She follows with oncology and is doing well

## 2021-04-02 NOTE — Assessment & Plan Note (Signed)
She is working with her dentist, they have her on Amoxicillin before they proceed with procedures.

## 2021-04-02 NOTE — Assessment & Plan Note (Signed)
She is improving but still notes fatigue and cough. Is given a refill on Tussionex which she uses infrequently

## 2021-04-02 NOTE — Assessment & Plan Note (Signed)
hgba1c acceptable, minimize simple carbs. Increase exercise as tolerated.  

## 2021-04-03 ENCOUNTER — Encounter: Payer: Self-pay | Admitting: Hematology & Oncology

## 2021-04-03 ENCOUNTER — Telehealth: Payer: Self-pay

## 2021-04-03 ENCOUNTER — Other Ambulatory Visit: Payer: Self-pay

## 2021-04-03 ENCOUNTER — Inpatient Hospital Stay: Payer: Medicare Other | Attending: Hematology & Oncology

## 2021-04-03 ENCOUNTER — Ambulatory Visit (HOSPITAL_BASED_OUTPATIENT_CLINIC_OR_DEPARTMENT_OTHER)
Admission: RE | Admit: 2021-04-03 | Discharge: 2021-04-03 | Disposition: A | Discharge status: Home / Self Care | Payer: Medicare Other | Source: Ambulatory Visit | Attending: Hematology & Oncology | Admitting: Hematology & Oncology

## 2021-04-03 ENCOUNTER — Inpatient Hospital Stay (HOSPITAL_BASED_OUTPATIENT_CLINIC_OR_DEPARTMENT_OTHER): Payer: Medicare Other | Admitting: Hematology & Oncology

## 2021-04-03 VITALS — BP 140/87 | HR 79 | Temp 98.5°F | Resp 17 | Ht 66.0 in | Wt 201.0 lb

## 2021-04-03 DIAGNOSIS — Z780 Asymptomatic menopausal state: Secondary | ICD-10-CM | POA: Diagnosis not present

## 2021-04-03 DIAGNOSIS — C9000 Multiple myeloma not having achieved remission: Secondary | ICD-10-CM

## 2021-04-03 DIAGNOSIS — M545 Low back pain, unspecified: Secondary | ICD-10-CM | POA: Diagnosis not present

## 2021-04-03 LAB — CBC WITH DIFFERENTIAL (CANCER CENTER ONLY)
Abs Immature Granulocytes: 0.01 10*3/uL (ref 0.00–0.07)
Basophils Absolute: 0 10*3/uL (ref 0.0–0.1)
Basophils Relative: 1 %
Eosinophils Absolute: 0.1 10*3/uL (ref 0.0–0.5)
Eosinophils Relative: 3 %
HCT: 37.5 % (ref 36.0–46.0)
Hemoglobin: 12.4 g/dL (ref 12.0–15.0)
Immature Granulocytes: 0 %
Lymphocytes Relative: 28 %
Lymphs Abs: 1 10*3/uL (ref 0.7–4.0)
MCH: 29.8 pg (ref 26.0–34.0)
MCHC: 33.1 g/dL (ref 30.0–36.0)
MCV: 90.1 fL (ref 80.0–100.0)
Monocytes Absolute: 0.3 10*3/uL (ref 0.1–1.0)
Monocytes Relative: 9 %
Neutro Abs: 2.1 10*3/uL (ref 1.7–7.7)
Neutrophils Relative %: 59 %
Platelet Count: 129 10*3/uL — ABNORMAL LOW (ref 150–400)
RBC: 4.16 MIL/uL (ref 3.87–5.11)
RDW: 12.7 % (ref 11.5–15.5)
WBC Count: 3.6 10*3/uL — ABNORMAL LOW (ref 4.0–10.5)
nRBC: 0 % (ref 0.0–0.2)

## 2021-04-03 LAB — LACTATE DEHYDROGENASE: LDH: 218 U/L — ABNORMAL HIGH (ref 98–192)

## 2021-04-03 LAB — CMP (CANCER CENTER ONLY)
ALT: 22 U/L (ref 0–44)
AST: 28 U/L (ref 15–41)
Albumin: 3.9 g/dL (ref 3.5–5.0)
Alkaline Phosphatase: 41 U/L (ref 38–126)
Anion gap: 8 (ref 5–15)
BUN: 19 mg/dL (ref 8–23)
CO2: 27 mmol/L (ref 22–32)
Calcium: 10 mg/dL (ref 8.9–10.3)
Chloride: 107 mmol/L (ref 98–111)
Creatinine: 0.66 mg/dL (ref 0.44–1.00)
GFR, Estimated: >60 mL/min (ref >60)
Glucose, Bld: 99 mg/dL (ref 70–99)
Potassium: 4 mmol/L (ref 3.5–5.1)
Sodium: 142 mmol/L (ref 135–145)
Total Bilirubin: 0.4 mg/dL (ref 0.3–1.2)
Total Protein: 7.6 g/dL (ref 6.5–8.1)

## 2021-04-03 NOTE — Telephone Encounter (Signed)
appts made and printed for pt Tami [name] °

## 2021-04-03 NOTE — Progress Notes (Signed)
Hematology and Oncology Follow Up Visit  Tami [name] [id number] [birth year] 65 y.o. 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  She is doing pretty well.  She is having some lower back discomfort.  We will go ahead and get a plain x-ray of her lower back.  I do not think this would be myeloma.  I would have to believe that this is probably more so arthritis.  As far as her myeloma is concerned, she is doing okay on this.  Everything is holding pretty steady.  Her last monoclonal studies showed a M spike of 1.1 g/dL.  The IgG level was 1900 mg/dL.  Her kappa light chain was 2.7 mg/dL.  She has had no problems with diarrhea.  She has had no nausea or vomiting.  There is been no issues with rashes.  She is had no leg swelling.  Overall, her performance status is ECOG 1.      Medications:  Allergies as of 04/03/2021      Reactions   Benazepril Anaphylaxis, Swelling, Hives   angioedema Throat and lip swelling   Ondansetron Hcl Hives   Redness and hives post IV admin on 07/05/17   Codeine Nausea And Vomiting   Morphine Hives   Redness and hives noted post IV admin on 07/05/17      Medication List       Accurate as of Apr 03, 2021  8:14 AM. If you have any questions, ask your nurse or doctor.        STOP taking these medications   amoxicillin 500 MG capsule Commonly known as: AMOXIL Stopped by: [name], MD   HYDROcodone-acetaminophen 5-325 MG tablet Commonly known as: NORCO/VICODIN Stopped by: [name], MD   melatonin 3 MG Tabs tablet Stopped by:  [name], MD     TAKE these medications   acetaminophen 500 MG tablet Commonly known as: TYLENOL Take 500 mg by mouth every 6 (six) hours as needed.   ACETAMINOPHEN-BUTALBITAL 50-325 MG Tabs Take 1 tablet by mouth 3 (three) times daily as needed.   albuterol 108 (90 Base) MCG/ACT inhaler Commonly known as: VENTOLIN HFA Inhale 2 puffs into the lungs every 6 (six) hours as needed for wheezing or shortness of breath.   amoxicillin-clavulanate 500-125 MG tablet Commonly known as: AUGMENTIN Take 1 tablet by mouth 2 (two) times daily.   aspirin EC 81 MG tablet Take 1 tablet (81 mg total) by mouth daily.   budesonide-formoterol 160-4.5 MCG/ACT inhaler Commonly known as: SYMBICORT Inhale 2 puffs into the lungs 2 (two) times daily.   carvedilol 25 MG tablet Commonly known as: COREG Take 1 tablet (25 mg total) by mouth 2 (two) times daily with a meal.   chlorpheniramine-HYDROcodone 10-8 MG/5ML Suer Commonly known as: Tussionex Pennkinetic ER Take 5 mLs by mouth every 12 (twelve) hours as needed for cough.   cyclobenzaprine 10 MG tablet Commonly known as: FLEXERIL Take 1 tablet (10 mg total) by mouth at bedtime.   diphenhydrAMINE 25 mg capsule Commonly known as: BENADRYL TAKE ONE (  1) CAPSULE BY MOUTH THREE (3) TIMES EACH DAY AS NEEDED   famotidine 40 MG tablet Commonly known as: PEPCID Take 40 mg by mouth 2 (two) times daily.   fluticasone 50 MCG/ACT nasal spray Commonly known as: FLONASE Place 2 sprays into both nostrils daily.   furosemide 20 MG tablet Commonly known as: LASIX Take 1 tablet by mouth as directed.   guaiFENesin 600 MG 12 hr tablet Commonly known as: MUCINEX Take 600 mg by mouth 2 (two) times daily.   IBU 800 MG tablet Generic drug: ibuprofen Take 800 mg by mouth every 6 (six) hours as needed.   losartan 25 MG tablet Commonly known as: COZAAR Take 25 mg by mouth. Take two tablets, total of 50 mg daily   magnesium oxide 400 MG  tablet Commonly known as: MAG-OX Take 1 tablet (400 mg total) by mouth 2 (two) times daily.   meclizine 12.5 MG tablet Commonly known as: ANTIVERT Take 12.5 mg by mouth 3 (three) times daily.   montelukast 10 MG tablet Commonly known as: Singulair Take 1 tablet (10 mg total) by mouth at bedtime.   MULTIVITAMIN ADULT PO Take 1 tablet by mouth daily.   pantoprazole 40 MG tablet Commonly known as: PROTONIX Take 1 tablet (40 mg total) by mouth 2 (two) times daily.   predniSONE 10 MG tablet Commonly known as: DELTASONE Take by mouth.   promethazine 25 MG tablet Commonly known as: PHENERGAN Take 1 tablet (25 mg total) by mouth every 6 (six) hours as needed for nausea or vomiting.   spironolactone 25 MG tablet Commonly known as: ALDACTONE Take 1 tablet by mouth daily.   sucralfate 1 g tablet Commonly known as: Carafate Take 1 tablet (1 g total) by mouth 3 (three) times daily as needed. prn   topiramate 50 MG tablet Commonly known as: Topamax Take 1 tablet (50 mg total) by mouth 2 (two) times daily.   triamcinolone cream 0.1 % Commonly known as: KENALOG Apply topically 2 (two) times daily.   Vitamin D-3 25 MCG (1000 UT) Caps Take 1 capsule (1,000 Units total) by mouth daily.       Allergies:  Allergies  Allergen Reactions  . Benazepril Anaphylaxis, Swelling and Hives    angioedema Throat and lip swelling  . Ondansetron Hcl Hives    Redness and hives post IV admin on 07/05/17  . Codeine Nausea And Vomiting  . Morphine Hives    Redness and hives noted post IV admin on 07/05/17    Past Medical History, Surgical history, Social history, and Family History were reviewed and updated.  Review of Systems: Review of Systems  Constitutional: Negative.   HENT: Negative.   Eyes: Negative.   Respiratory: Negative.   Cardiovascular: Negative.   Gastrointestinal: Negative.   Genitourinary: Negative.   Musculoskeletal: Negative.   Skin: Negative.   Neurological:  Negative.   Endo/Heme/Allergies: Negative.   Psychiatric/Behavioral: Negative.       Physical Exam:  vitals were not taken for this visit.   Wt Readings from Last 3 Encounters:  04/01/21 200 lb 8 oz (90.9 kg)  02/25/21 201 lb (91.2 kg)  02/14/21 203 lb (92.1 kg)    Physical Exam Vitals reviewed.  HENT:     Head: Normocephalic and atraumatic.  Eyes:     Pupils: Pupils are equal, round, and reactive to light.  Cardiovascular:     Rate and Rhythm: Normal rate and regular rhythm.     Heart sounds: Normal heart sounds.  Pulmonary:     Effort: Pulmonary effort is normal.     Breath sounds: Normal breath sounds.  Abdominal:     General: Bowel sounds are normal.     Palpations: Abdomen is soft.  Musculoskeletal:        General: No tenderness or deformity. Normal range of motion.     Cervical back: Normal range of motion.  Lymphadenopathy:     Cervical: No cervical adenopathy.  Skin:    General: Skin is warm and dry.     Findings: No erythema or rash.  Neurological:     Mental Status: She is alert and oriented to person, place, and time.  Psychiatric:        Behavior: Behavior normal.        Thought Content: Thought content normal.        Judgment: Judgment normal.      Lab Results  Component Value Date   WBC 3.6 (L) 04/03/2021   HGB 12.4 04/03/2021   HCT 37.5 04/03/2021   MCV 90.1 04/03/2021   PLT 129 (L) 04/03/2021   Lab Results  Component Value Date   FERRITIN 2,483 (H) 08/26/2020   IRON 124 08/26/2020   TIBC 284 08/26/2020   UIBC 160 08/26/2020   IRONPCTSAT 44 08/26/2020   Lab Results  Component Value Date   RETICCTPCT 0.8 07/16/2016   RBC 4.16 04/03/2021   RETICCTABS 32,640 07/16/2016   Lab Results  Component Value Date   KPAFRELGTCHN 26.6 (H) 01/30/2021   LAMBDASER 2.0 (L) 01/30/2021   KAPLAMBRATIO 13.30 (H) 01/30/2021   Lab Results  Component Value Date   IGGSERUM 1,847 (H) 01/30/2021   IGGSERUM 2,003 (H) 01/30/2021   IGA 73 (L) 01/30/2021    IGA 74 (L) 01/30/2021   IGMSERUM 56 01/30/2021   IGMSERUM 56 01/30/2021   Lab Results  Component Value Date   TOTALPROTELP 7.4 01/30/2021   ALBUMINELP 3.5 01/30/2021   A1GS 0.3 01/30/2021   A2GS 1.1 (H) 01/30/2021   BETS 1.0 01/30/2021   BETA2SER 0.5 07/16/2016   GAMS 1.6 01/30/2021   MSPIKE 1.1 (H) 01/30/2021   SPEI Comment 12/19/2020     Chemistry      Component Value Date/Time   NA 140 01/30/2021 0900   NA 143 11/08/2017 1127   NA 140 12/31/2016 1000   K 5.3 sample moderately hemolyzed (H) 01/30/2021 0900   K 4.0 11/08/2017 1127   K 3.6 12/31/2016 1000   CL 106 01/30/2021 0900   CL 107 11/08/2017 1127   CO2 25 01/30/2021 0900   CO2 25 11/08/2017 1127   CO2 24 12/31/2016 1000   BUN 17 01/30/2021 0900   BUN 19 11/08/2017 1127   BUN 22.3 12/31/2016 1000   CREATININE 0.58 01/30/2021 0900   CREATININE 0.66 01/30/2021 0806   CREATININE 0.9 11/08/2017 1127   CREATININE 0.8 12/31/2016 1000      Component Value Date/Time   CALCIUM 9.5 01/30/2021 0900   CALCIUM 9.9 11/08/2017 1127   CALCIUM 9.5 12/31/2016 1000   ALKPHOS 45 01/30/2021 0900   ALKPHOS 44 11/08/2017 1127   ALKPHOS 51 12/31/2016 1000   AST 31 01/30/2021 0900   AST 24 01/30/2021 0806   AST 28 12/31/2016 1000   ALT 19 01/30/2021 0900   ALT 17 01/30/2021 0806   ALT 36 11/08/2017 1127   ALT 16 12/31/2016 1000   BILITOT 0.5 01/30/2021 0900   BILITOT 0.4 01/30/2021 0806   BILITOT 0.46 12/31/2016 1000  Impression and Plan: Ms. Beedle is a very pleasant 65 yo [place] American female with a previous IgG kappa MGUS with progression to myeloma.   So far, she been off treatment now for about 5 months.  Hopefully, we will continue to stay her off treatment.  We will have to see what her monoclonal studies look like.  I know that she does have issues with treatment.  She just has a sensitivity to therapy that we have to be aware of.  I would like to see her back in a couple months.   [name],  MD 5/26/20228:14 AM

## 2021-04-04 ENCOUNTER — Telehealth: Payer: Self-pay | Admitting: *Deleted

## 2021-04-04 LAB — IGG, IGA, IGM
IgA: 72 mg/dL — ABNORMAL LOW (ref 87–352)
IgG (Immunoglobin G), Serum: 1915 mg/dL — ABNORMAL HIGH (ref 586–1602)
IgM (Immunoglobulin M), Srm: 56 mg/dL (ref 26–217)

## 2021-04-04 LAB — KAPPA/LAMBDA LIGHT CHAINS
Kappa free light chain: 34.7 mg/L — ABNORMAL HIGH (ref 3.3–19.4)
Kappa, lambda light chain ratio: 11.57 — ABNORMAL HIGH (ref 0.26–1.65)
Lambda free light chains: 3 mg/L — ABNORMAL LOW (ref 5.7–26.3)

## 2021-04-04 NOTE — Telephone Encounter (Signed)
-----   Message from [name], MD sent at 04/04/2021 11:00 AM EDT ----- Please call her and tell her that the x-rays look okay.  There is no obvious myeloma involvement.  She does have some slippage of her vertebral bodies.  She may need to see an orthopedist about this.  pete

## 2021-04-04 NOTE — Telephone Encounter (Signed)
Unable to reach pt.  LMOVM with MD recommendations and instructions. Encouraged pt to call office with concerns.

## 2021-04-10 ENCOUNTER — Other Ambulatory Visit: Payer: Self-pay | Admitting: Family Medicine

## 2021-04-10 ENCOUNTER — Telehealth: Payer: Self-pay | Admitting: Family Medicine

## 2021-04-10 MED ORDER — HYDROCODONE BIT-HOMATROP MBR 5-1.5 MG/5ML PO SOLN
5.0000 mL | Freq: Three times a day (TID) | ORAL | 0 refills | Status: DC | PRN
Start: 2021-04-10 — End: 2021-04-14

## 2021-04-10 NOTE — Telephone Encounter (Signed)
Pt states that her last visit her and Dr. B talked about a cough medicine that worked for her previously. Dr. B sent in a rx for cough med but it was the wrong one it was expensive. The pharmacy informed her of the rx that she had taking previously and sent the request to Dr. Jacinto Reap. Pt just want to confirm she received the request from [facility] on yesterday.

## 2021-04-10 NOTE — Telephone Encounter (Signed)
I sent in the [facility]

## 2021-04-10 NOTE — Telephone Encounter (Signed)
Looks like she has taken Hycodan before.

## 2021-04-11 LAB — IMMUNOFIXATION REFLEX, SERUM
IgA: 65 mg/dL — ABNORMAL LOW (ref 87–352)
IgG (Immunoglobin G), Serum: 1921 mg/dL — ABNORMAL HIGH (ref 586–1602)
IgM (Immunoglobulin M), Srm: 52 mg/dL (ref 26–217)

## 2021-04-11 LAB — PROTEIN ELECTROPHORESIS, SERUM, WITH REFLEX
A/G Ratio: 0.8 (ref 0.7–1.7)
Albumin ELP: 3.3 g/dL (ref 2.9–4.4)
Alpha-1-Globulin: 0.3 g/dL (ref 0.0–0.4)
Alpha-2-Globulin: 1.1 g/dL — ABNORMAL HIGH (ref 0.4–1.0)
Beta Globulin: 1.1 g/dL (ref 0.7–1.3)
Gamma Globulin: 1.6 g/dL (ref 0.4–1.8)
Globulin, Total: 4.1 g/dL — ABNORMAL HIGH (ref 2.2–3.9)
M-Spike, %: 1.3 g/dL — ABNORMAL HIGH
SPEP Interpretation: 0
Total Protein ELP: 7.4 g/dL (ref 6.0–8.5)

## 2021-04-11 NOTE — Telephone Encounter (Signed)
Patient notified that rx has been sent in. 

## 2021-04-14 ENCOUNTER — Telehealth: Payer: Self-pay | Admitting: *Deleted

## 2021-04-14 ENCOUNTER — Other Ambulatory Visit: Payer: Self-pay | Admitting: Family Medicine

## 2021-04-14 DIAGNOSIS — Z20822 Contact with and (suspected) exposure to covid-19: Secondary | ICD-10-CM | POA: Diagnosis not present

## 2021-04-14 MED ORDER — PROMETHAZINE-DM 6.25-15 MG/5ML PO SYRP: 5.0000 mL | ORAL_SOLUTION | Freq: Three times a day (TID) | ORAL | 0 refills | Status: DC | PRN

## 2021-04-14 NOTE — Telephone Encounter (Signed)
OK I sent in the Promethazine DM liquid but remind her she has Promethazine tablets and not to take them together.

## 2021-04-14 NOTE — Telephone Encounter (Signed)
Left message on machine that rx has been sent in and to not take with the promethazine tablets.

## 2021-04-14 NOTE — Telephone Encounter (Signed)
The hycodan syrup was still the wrong thing.  Patient went to pharmacy and they printed out for her.  The cough syrup that they had on file was Promethazine-DM 6.25-15mg /5mg  Syrup.  Can we send that in to [place] please?

## 2021-04-21 ENCOUNTER — Other Ambulatory Visit: Payer: Self-pay | Admitting: Gastroenterology

## 2021-04-21 DIAGNOSIS — K760 Fatty (change of) liver, not elsewhere classified: Secondary | ICD-10-CM

## 2021-04-21 DIAGNOSIS — K219 Gastro-esophageal reflux disease without esophagitis: Secondary | ICD-10-CM

## 2021-04-25 DIAGNOSIS — Z0000 Encounter for general adult medical examination without abnormal findings: Secondary | ICD-10-CM | POA: Diagnosis not present

## 2021-04-25 DIAGNOSIS — Z20822 Contact with and (suspected) exposure to covid-19: Secondary | ICD-10-CM | POA: Diagnosis not present

## 2021-04-25 DIAGNOSIS — R5383 Other fatigue: Secondary | ICD-10-CM | POA: Diagnosis not present

## 2021-04-25 DIAGNOSIS — R350 Frequency of micturition: Secondary | ICD-10-CM | POA: Diagnosis not present

## 2021-04-25 DIAGNOSIS — R0602 Shortness of breath: Secondary | ICD-10-CM | POA: Diagnosis not present

## 2021-04-25 DIAGNOSIS — E7800 Pure hypercholesterolemia, unspecified: Secondary | ICD-10-CM | POA: Diagnosis not present

## 2021-04-25 DIAGNOSIS — Z79899 Other long term (current) drug therapy: Secondary | ICD-10-CM | POA: Diagnosis not present

## 2021-04-25 DIAGNOSIS — E559 Vitamin D deficiency, unspecified: Secondary | ICD-10-CM | POA: Diagnosis not present

## 2021-04-25 DIAGNOSIS — Z131 Encounter for screening for diabetes mellitus: Secondary | ICD-10-CM | POA: Diagnosis not present

## 2021-04-30 DIAGNOSIS — Z95810 Presence of automatic (implantable) cardiac defibrillator: Secondary | ICD-10-CM | POA: Diagnosis not present

## 2021-04-30 DIAGNOSIS — Z8679 Personal history of other diseases of the circulatory system: Secondary | ICD-10-CM | POA: Diagnosis not present

## 2021-04-30 DIAGNOSIS — C9000 Multiple myeloma not having achieved remission: Secondary | ICD-10-CM | POA: Diagnosis not present

## 2021-04-30 DIAGNOSIS — G4733 Obstructive sleep apnea (adult) (pediatric): Secondary | ICD-10-CM | POA: Diagnosis not present

## 2021-04-30 DIAGNOSIS — I110 Hypertensive heart disease with heart failure: Secondary | ICD-10-CM | POA: Diagnosis not present

## 2021-04-30 DIAGNOSIS — U071 COVID-19: Secondary | ICD-10-CM | POA: Diagnosis not present

## 2021-04-30 DIAGNOSIS — I5042 Chronic combined systolic (congestive) and diastolic (congestive) heart failure: Secondary | ICD-10-CM | POA: Diagnosis not present

## 2021-04-30 DIAGNOSIS — I428 Other cardiomyopathies: Secondary | ICD-10-CM | POA: Diagnosis not present

## 2021-04-30 DIAGNOSIS — D801 Nonfamilial hypogammaglobulinemia: Secondary | ICD-10-CM | POA: Diagnosis not present

## 2021-04-30 DIAGNOSIS — E782 Mixed hyperlipidemia: Secondary | ICD-10-CM | POA: Diagnosis not present

## 2021-05-08 ENCOUNTER — Telehealth: Payer: Self-pay | Admitting: Family Medicine

## 2021-05-08 ENCOUNTER — Other Ambulatory Visit: Payer: Self-pay | Admitting: Family Medicine

## 2021-05-08 MED ORDER — ZOLPIDEM TARTRATE 5 MG PO TABS: 5.0000 mg | ORAL_TABLET | Freq: Every evening | ORAL | 1 refills | Status: DC | PRN

## 2021-05-08 NOTE — Telephone Encounter (Signed)
Looks like she had Ambien 10mg  in [place] in 2019.  Her last visit was 04/01/21.  Next visit is scheduled for 06/02/21.

## 2021-05-08 NOTE — Telephone Encounter (Signed)
The patient states she would like some medication to help her sleep. The patient states that Dr. Charlett Blake has given her something in the past. The patient claims she only gets 4-5 hours of sleep per night.   [facility] - [place], [place] - [address] Phone:  [phone]  Fax:  [phone]

## 2021-05-08 NOTE — Telephone Encounter (Signed)
Left detailed message on machine.

## 2021-05-28 ENCOUNTER — Other Ambulatory Visit: Payer: Self-pay

## 2021-05-28 ENCOUNTER — Ambulatory Visit (INDEPENDENT_AMBULATORY_CARE_PROVIDER_SITE_OTHER): Payer: Medicare Other | Admitting: Family Medicine

## 2021-05-28 ENCOUNTER — Encounter: Payer: Self-pay | Admitting: Family Medicine

## 2021-05-28 VITALS — BP 142/82 | HR 91 | Temp 98.2°F | Resp 12 | Ht 66.0 in | Wt 202.6 lb

## 2021-05-28 DIAGNOSIS — R197 Diarrhea, unspecified: Secondary | ICD-10-CM

## 2021-05-28 DIAGNOSIS — K219 Gastro-esophageal reflux disease without esophagitis: Secondary | ICD-10-CM | POA: Diagnosis not present

## 2021-05-28 MED ORDER — DICYCLOMINE HCL 10 MG PO CAPS: ORAL_CAPSULE | ORAL | 0 refills | Status: DC

## 2021-05-28 MED ORDER — ESOMEPRAZOLE MAGNESIUM 40 MG PO CPDR: 40.0000 mg | DELAYED_RELEASE_CAPSULE | Freq: Every day | ORAL | 3 refills | Status: DC

## 2021-05-28 NOTE — Progress Notes (Signed)
Chief Complaint  Patient presents with   Abdominal Pain    2 weeks and worse past couple days   Diarrhea     Subjective Tami [name] is a 65 y.o. female who presents with epigastric abd pain and diarrhea Symptoms began 2 weeks, diarrhea started 1 week ago and not improving Eating flares her s/s's.  She has a history of reflux and has been taking Protonix 40 mg twice daily but it is not currently helping. She has a history of esophageal dysphagia and was set to have ballooning of her esophagus but had a cardiac issue and required a pacemaker placement so never followed up with this. Patient has abdominal pain, diarrhea, weakness, and nausea Patient denies cramping, vomiting, fever, and URI symptoms Tx to date: Tylenol, Protonix, Loperamide.  Sick contacts: none known  Past Medical History:  Diagnosis Date   Abnormal SPEP 07/26/2016   Arthritis    knees, hands   Back pain 07/14/2016   Bowel obstruction ([place]) 11/2014   C. 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   Low back pain 07/14/2016   Monoclonal gammopathy of unknown significance (MGUS) 09/16/2016   Multiple myeloma ([place]) 07/20/2018   Multiple myeloma not having achieved remission ([place]) 07/20/2018   Myalgia 12/31/2016   Obstructive sleep apnea 03/31/2015   SOB (shortness of breath) 04/09/2016   Stroke ([place])    TIAs   UTI (urinary tract infection)    Past Surgical History:  Procedure Laterality Date   ABDOMINAL HYSTERECTOMY     menorraghia, 2006, total   CHOLECYSTECTOMY     COLONOSCOPY  2018   [facility] per patient   ESOPHAGOGASTRODUODENOSCOPY  2018   [facility]     right, repair torn  torn cartialage   PACEMAKER INSERTION  08/2014   TONSILLECTOMY     TUBAL LIGATION     Current Outpatient Medications on File Prior to Visit  Medication Sig Dispense Refill   acetaminophen (TYLENOL) 500 MG tablet Take 500 mg by mouth every 6 (six) hours as needed.     albuterol (VENTOLIN HFA) 108 (90 Base) MCG/ACT inhaler Inhale 2 puffs into the lungs every 6 (six) hours as needed for wheezing or shortness of breath. 18 g 5   amoxicillin-clavulanate (AUGMENTIN) 500-125 MG tablet Take 1 tablet by mouth 2 (two) times daily.     aspirin EC 81 MG tablet Take 1 tablet (81 mg total) by mouth daily. 90 tablet 1   budesonide-formoterol (SYMBICORT) 160-4.5 MCG/ACT inhaler Inhale 2 puffs into the lungs 2 (two) times daily. 1 each 3   carvedilol (COREG) 25 MG tablet Take 1 tablet (25 mg total) by mouth 2 (two) times daily with a meal. 180 tablet 1   Cholecalciferol (VITAMIN D-3) 25 MCG (1000 UT) CAPS Take 1 capsule (1,000 Units total) by mouth daily. 60 capsule 2   cyclobenzaprine (FLEXERIL) 10 MG tablet Take 1 tablet (10 mg total) by mouth at bedtime. 7 tablet 0   diphenhydrAMINE (BENADRYL) 25 mg capsule TAKE ONE (1) CAPSULE BY MOUTH THREE (3) TIMES EACH DAY AS NEEDED     fluticasone (FLONASE) 50 MCG/ACT nasal  spray Place 2 sprays into both nostrils daily. 16 g 0   furosemide (LASIX) 20 MG tablet Take 1 tablet by mouth as directed.     IBU 800 MG tablet Take 800 mg by mouth every 6 (six) hours as needed.     losartan (COZAAR) 25 MG tablet Take 25 mg by mouth. Take two tablets, total of 50 mg daily     magnesium oxide (MAG-OX) 400 MG tablet Take 1 tablet (400 mg total) by mouth 2 (two) times daily. 60 tablet 1   meclizine (ANTIVERT) 12.5 MG tablet Take 12.5 mg by mouth 3 (three) times daily.     montelukast (SINGULAIR) 10 MG tablet Take 1 tablet (10 mg total) by mouth at bedtime. 30 tablet 3   Multiple Vitamins-Minerals (MULTIVITAMIN ADULT PO) Take 1 tablet by mouth daily.     pantoprazole (PROTONIX) 40  MG tablet Take 1 tablet (40 mg total) by mouth 2 (two) times daily. 180 tablet 1   promethazine (PHENERGAN) 25 MG tablet Take 1 tablet (25 mg total) by mouth every 6 (six) hours as needed for nausea or vomiting. 30 tablet 0   promethazine-dextromethorphan (PROMETHAZINE-DM) 6.25-15 MG/5ML syrup Take 5 mLs by mouth 3 (three) times daily as needed for cough. 240 mL 0   sucralfate (CARAFATE) 1 g tablet Take 1 tablet (1 g total) by mouth 3 (three) times daily as needed. prn 90 tablet 1   topiramate (TOPAMAX) 50 MG tablet Take 1 tablet (50 mg total) by mouth 2 (two) times daily. 60 tablet 3   triamcinolone cream (KENALOG) 0.1 % Apply topically 2 (two) times daily.     zolpidem (AMBIEN) 5 MG tablet Take 1 tablet (5 mg total) by mouth at bedtime as needed for sleep. 15 tablet 1   [DISCONTINUED] prochlorperazine (COMPAZINE) 10 MG tablet Take 1 tablet (10 mg total) by mouth every 6 (six) hours as needed (Nausea or vomiting). 30 tablet 1    Exam BP (!) 142/82 (BP Location: Left Arm, Cuff Size: Large)   Pulse 91   Temp 98.2 F (36.8 C) (Oral)   Resp 12   Ht $R'5\' 6"'ps$  (1.676 m)   Wt 202 lb 9.6 oz (91.9 kg)   SpO2 96%   BMI 32.70 kg/m  General:  well developed, well hydrated, in no apparent distress Skin:  warm, no pallor or diaphoresis, no rashes Throat/Pharynx:  lips and gingiva without lesion; tongue and uvula midline; non-inflamed pharynx; no exudates or postnasal drainage Lungs:  clear to auscultation, breath sounds equal bilaterally, no respiratory distress, no wheezes Cardio:  regular rate and rhythm without murmurs Abdomen:  abdomen soft, she is diffusely tender to palpation, worse in the epigastric region; bowel sounds normal; no masses or organomegaly; negative Murphy's, McBurney's, Rovsing's, Carnett's Extremities:  no clubbing, cyanosis, or edema Psych: Appropriate judgement/insight  Assessment and Plan  Diarrhea, unspecified type - Plan: Ova and parasite examination, Stool Culture,  CANCELED: Ova and parasite examination, CANCELED: Stool Culture  Gastroesophageal reflux disease, unspecified whether esophagitis present - Plan: esomeprazole (NEXIUM) 40 MG capsule  New, uncertain prog. Ck stool cx. Stay hydrated. Chronic, uncontrolled.  Follow-up with gastroenterology.  Number provided today.  We will change Protonix to Nexium.  Trial Bentyl for generalized pain. Avoid aggravating foods, discussed BRAT diet. F/u if symptoms fail to improve, sooner if worsening. The patient voiced understanding and agreement to the plan.  [place], DO 05/28/21  1:39 PM

## 2021-05-28 NOTE — Patient Instructions (Addendum)
The only lifestyle changes that have data behind them are weight loss for the overweight/obese and elevating the head of the bed. Finding out which foods/positions are triggers is important.  There should be no cost issues with the medicines.  Call your GI doc's office asap: [phone].  Let us know if you need anything.

## 2021-06-02 ENCOUNTER — Ambulatory Visit (INDEPENDENT_AMBULATORY_CARE_PROVIDER_SITE_OTHER): Payer: Medicare Other | Admitting: Family Medicine

## 2021-06-02 ENCOUNTER — Other Ambulatory Visit: Payer: Self-pay

## 2021-06-02 ENCOUNTER — Encounter: Payer: Self-pay | Admitting: Family Medicine

## 2021-06-02 VITALS — BP 124/78 | HR 95 | Temp 98.2°F | Resp 16 | Wt 203.2 lb

## 2021-06-02 DIAGNOSIS — E785 Hyperlipidemia, unspecified: Secondary | ICD-10-CM

## 2021-06-02 DIAGNOSIS — Z23 Encounter for immunization: Secondary | ICD-10-CM | POA: Diagnosis not present

## 2021-06-02 DIAGNOSIS — R109 Unspecified abdominal pain: Secondary | ICD-10-CM | POA: Diagnosis not present

## 2021-06-02 DIAGNOSIS — I10 Essential (primary) hypertension: Secondary | ICD-10-CM

## 2021-06-02 DIAGNOSIS — R739 Hyperglycemia, unspecified: Secondary | ICD-10-CM | POA: Diagnosis not present

## 2021-06-02 DIAGNOSIS — K2100 Gastro-esophageal reflux disease with esophagitis, without bleeding: Secondary | ICD-10-CM | POA: Diagnosis not present

## 2021-06-02 DIAGNOSIS — R7989 Other specified abnormal findings of blood chemistry: Secondary | ICD-10-CM | POA: Diagnosis not present

## 2021-06-02 DIAGNOSIS — E041 Nontoxic single thyroid nodule: Secondary | ICD-10-CM | POA: Diagnosis not present

## 2021-06-02 MED ORDER — PROMETHAZINE-DM 6.25-15 MG/5ML PO SYRP: 5.0000 mL | ORAL_SOLUTION | Freq: Three times a day (TID) | ORAL | 1 refills | Status: DC | PRN

## 2021-06-02 NOTE — Patient Instructions (Addendum)
Paxlovid and Molnupiravir are the new COVID medications we can give you if you get COVID so make sure you test if you have symptoms because we have to treat by day 5 of symptoms for it to be effective. If you are positive let us know so we can treat. If a home test is negative and your symptoms are persistent get a PCR test. Can check testing locations at [email or web address] If you are positive we will make an appointment with [place] and we will send in Paxlovid if you would like it. Check with your pharmacy before we meet to confirm they have it in stock, if they do not then we can get the prescription at the [facility], [facility] When you have gastroesophageal reflux disease (GERD), the foods you eat and your eating habits are very important. Choosing the right foods can help ease your discomfort. Think about working with a food expert (dietitian) to help you make good choices. What are tips for following this plan? Reading food labels Look for foods that are low in saturated fat. Foods that may help with your symptoms include: Foods that have less than 5% of daily value (DV) of fat. Foods that have 0 grams of trans fat. Cooking Do not fry your food. Cook your food by baking, steaming, grilling, or broiling. These are all methods that do not need a lot of fat for cooking. To add flavor, try to use herbs that are low in spice and acidity. Meal planning  Choose healthy foods that are low in fat, such as: Fruits and vegetables. Whole grains. Low-fat dairy products. Lean meats, fish, and poultry. Eat small meals often instead of eating 3 large meals each day. Eat your meals slowly in a place where you are relaxed. Avoid bending over or lying down until 2-3 hours after eating. Limit high-fat foods such as fatty meats or fried foods. Limit your intake of fatty foods, such as oils, butter, and shortening. Avoid the following as told by  your doctor: Foods that cause symptoms. These may be different for different people. Keep a food diary to keep track of foods that cause symptoms. Alcohol. Drinking a lot of liquid with meals. Eating meals during the 2-3 hours before bed.  Lifestyle Stay at a healthy weight. Ask your doctor what weight is healthy for you. If you need to lose weight, work with your doctor to do so safely. Exercise for at least 30 minutes on 5 or more days each week, or as told by your doctor. Wear loose-fitting clothes. Do not smoke or use any products that contain nicotine or tobacco. If you need help quitting, ask your doctor. Sleep with the head of your bed higher than your feet. Use a wedge under the mattress or blocks under the bed frame to raise the head of the bed. Chew sugar-free gum after meals. What foods should eat?  Eat a healthy, well-balanced diet of fruits, vegetables, whole grains, low-fatdairy products, lean meats, fish, and poultry. Each person is different. Foods that may cause symptoms in one person may not cause any symptoms inanother person. Work with your doctor to find foods that are safe for you. The items listed above may not be a complete list of what you can eat and drink. Contact a food expert for more options. What foods should I avoid? Limiting some of these foods may help in managing the symptoms of GERD. Everyone is different.  Talk with a food expert or your doctor to help you findthe exact foods to avoid, if any. Fruits Any fruits prepared with added fat. Any fruits that cause symptoms. For some people, this may include citrus fruits, such as oranges, grapefruit, pineapple,and lemons. Vegetables Deep-fried vegetables. [place] fries. Any vegetables prepared with added fat. Any vegetables that cause symptoms. For some people, this may include tomatoesand tomato products, chili peppers, onions and garlic, and horseradish. Grains Pastries or quick breads with added fat. Meats and  other proteins High-fat meats, such as fatty beef or pork, hot dogs, ribs, ham, sausage, salami, and bacon. Fried meat or protein, including fried fish and friedchicken. Nuts and nut butters, in large amounts. Dairy Whole milk and chocolate milk. Sour cream. Cream. Ice cream. Cream cheese.Milkshakes. Fats and oils Butter. Margarine. Shortening. Ghee. Beverages Coffee and tea, with or without caffeine. Carbonated beverages. Sodas. Energy drinks. Fruit juice made with acidic fruits, such as orange or grapefruit.Tomato juice. Alcoholic drinks. Sweets and desserts Chocolate and cocoa. Donuts. Seasonings and condiments Pepper. Peppermint and spearmint. Added salt. Any condiments, herbs, or seasonings that cause symptoms. For some people, this may include curry, hotsauce, or vinegar-based salad dressings. The items listed above may not be a complete list of what you should not eat and drink. Contact a food expert for more options. Questions to ask your doctor Diet and lifestyle changes are often the first steps that are taken to manage symptoms of GERD. If diet and lifestyle changes do not help, talk with yourdoctor about taking medicines. Where to find more information [employer]: aboutgerd.org Summary When you have GERD, food and lifestyle choices are very important in easing your symptoms. Eat small meals often instead of 3 large meals a day. Eat your meals slowly and in a place where you are relaxed. Avoid bending over or lying down until 2-3 hours after eating. Limit high-fat foods such as fatty meats or fried foods. This information is not intended to replace advice given to you by your health care provider. Make sure you discuss any questions you have with your healthcare provider. Document Revised: 05/06/2020 Document Reviewed: 05/06/2020 Elsevier Patient Education  [place].

## 2021-06-02 NOTE — Progress Notes (Signed)
Subjective:   By signing my name below, I, Tami [name], attest that this documentation has been prepared under the direction and in the presence of Tami Canary, MD 06/02/2021   [name], female    DOB: 07-29-1956, 65 y.o.   MRN: [id number]  Chief Complaint  Patient presents with   Follow-up   Hyperlipidemia   Hypertension    HPI Patient is in today for an office visit. She was experiencing heartburn and abdominal pain and went to see a gastroenterologist where her condition was managed effectively. Recently,there has been another bout of heartburn, abdominal pain and is also having nausea. She came in last week for diarrhea and was given 40 mg Nexium PO daily to manage her symptoms and 10 mg dicyclomine prn to manage her cramps. She reports she is doing well on them.   She is asking for a refill on 25 mg Promethazine to help manage her nausea and acid reflux. She has an upcoming cardiac surgery on 21st October 2022 with Dr Gary Fleet. She has 4 Covid-19 vaccines at this time. She is willing to get her pneumonia booster vaccines  today.She is UTD on the shingles vaccine. She is UTD on tetanus vaccines.   Past Medical History:  Diagnosis Date   Abnormal SPEP 07/26/2016   Arthritis    knees, hands   Back pain 07/14/2016   Bowel obstruction (HCC) 11/2014   C. 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   Multiple myeloma (HCC) 07/20/2018   Multiple myeloma not having achieved remission (HCC) 07/20/2018   Myalgia 12/31/2016   Obstructive sleep apnea 03/31/2015   SOB (shortness of breath)  04/09/2016   Stroke (HCC)    TIAs   UTI (urinary tract infection)     Past Surgical History:  Procedure Laterality Date   ABDOMINAL HYSTERECTOMY     menorraghia, 2006, total   CHOLECYSTECTOMY     COLONOSCOPY  2018   [facility] per patient   ESOPHAGOGASTRODUODENOSCOPY  2018   [facility]    KNEE SURGERY     right, repair torn torn cartialage   PACEMAKER INSERTION  08/2014   TONSILLECTOMY     TUBAL LIGATION      Family History  Problem Relation Age of Onset   Diabetes Mother    Hypertension Mother    Heart disease Mother        s/p 1 stent   Hyperlipidemia Mother    Arthritis Mother    Cancer Father        COLON   Colon cancer Father 17   Irritable bowel syndrome Sister    Hyperlipidemia Daughter    Hypertension Daughter    Hypertension Maternal Grandmother    Arthritis Maternal Grandmother    Heart disease Maternal Grandfather        MI   Hypertension Maternal Grandfather    Arthritis Maternal Grandfather    Hypertension Son    Esophageal cancer Neg Hx     Social History   Socioeconomic History   Marital status: Divorced  Spouse name: [name]   Number of children: 3   Years of education: [name]   Highest education level: High school graduate  Occupational History   [name]  Tobacco Use   Smoking status: Never   Smokeless tobacco: Never  Vaping Use   Vaping Use: Never used  Substance and Sexual Activity   Alcohol use: No   Drug use: No   Sexual activity: [name]  Other Topics Concern   [name]  Social History Narrative   Lives at home alone   Retired   Caffeine: coffee   Social Determinants of Radio broadcast assistant Strain: [name]  Food Insecurity: [name]  Transportation Needs: [name]  Physical Activity: [name]  Stress: [name]  Social Connections: [name]  Intimate Partner Violence: [name]    Outpatient Medications Prior to Visit  Medication Sig Dispense  Refill   acetaminophen (TYLENOL) 500 MG tablet Take 500 mg by mouth every 6 (six) hours as needed.     albuterol (VENTOLIN HFA) 108 (90 Base) MCG/ACT inhaler Inhale 2 puffs into the lungs every 6 (six) hours as needed for wheezing or shortness of breath. 18 g 5   aspirin EC 81 MG tablet Take 1 tablet (81 mg total) by mouth daily. 90 tablet 1   budesonide-formoterol (SYMBICORT) 160-4.5 MCG/ACT inhaler Inhale 2 puffs into the lungs 2 (two) times daily. 1 each 3   carvedilol (COREG) 25 MG tablet Take 1 tablet (25 mg total) by mouth 2 (two) times daily with a meal. 180 tablet 1   Cholecalciferol (VITAMIN D-3) 25 MCG (1000 UT) CAPS Take 1 capsule (1,000 Units total) by mouth daily. 60 capsule 2   cyclobenzaprine (FLEXERIL) 10 MG tablet Take 1 tablet (10 mg total) by mouth at bedtime. 7 tablet 0   dicyclomine (BENTYL) 10 MG capsule Take 1 tab every 6 hours as needed for abdominal cramping. 60 capsule 0   diphenhydrAMINE (BENADRYL) 25 mg capsule TAKE ONE (1) CAPSULE BY MOUTH THREE (3) TIMES EACH DAY AS NEEDED     esomeprazole (NEXIUM) 40 MG capsule Take 1 capsule (40 mg total) by mouth daily. 30 capsule 3   fluticasone (FLONASE) 50 MCG/ACT nasal spray Place 2 sprays into both nostrils daily. 16 g 0   furosemide (LASIX) 20 MG tablet Take 1 tablet by mouth as directed.     IBU 800 MG tablet Take 800 mg by mouth every 6 (six) hours as needed.     losartan (COZAAR) 25 MG tablet Take 25 mg by mouth. Take two tablets, total of 50 mg daily     magnesium oxide (MAG-OX) 400 MG tablet Take 1 tablet (400 mg total) by mouth 2 (two) times daily. 60 tablet 1   meclizine (ANTIVERT) 12.5 MG tablet Take 12.5 mg by mouth 3 (three) times daily.     montelukast (SINGULAIR) 10 MG tablet Take 1 tablet (10 mg total) by mouth at bedtime. 30 tablet 3   Multiple Vitamins-Minerals (MULTIVITAMIN ADULT PO) Take 1 tablet by mouth daily.     promethazine (PHENERGAN) 25 MG tablet Take 1 tablet (25 mg total) by mouth every 6 (six) hours  as needed for nausea or vomiting. 30 tablet 0   sucralfate (CARAFATE) 1 g tablet Take 1 tablet (1 g total) by mouth 3 (three) times daily as needed. prn 90 tablet 1   topiramate (TOPAMAX) 50 MG tablet  Take 1 tablet (50 mg total) by mouth 2 (two) times daily. 60 tablet 3   triamcinolone cream (KENALOG) 0.1 % Apply topically 2 (two) times daily.     zolpidem (AMBIEN) 5 MG tablet Take 1 tablet (5 mg total) by mouth at bedtime as needed for sleep. 15 tablet 1   amoxicillin-clavulanate (AUGMENTIN) 500-125 MG tablet Take 1 tablet by mouth 2 (two) times daily.     No facility-administered medications prior to visit.    Allergies  Allergen Reactions   Benazepril Anaphylaxis, Swelling and Hives    angioedema Throat and lip swelling   Ondansetron Hcl Hives    Redness and hives post IV admin on 07/05/17   Codeine Nausea And Vomiting   Morphine Hives    Redness and hives noted post IV admin on 07/05/17    Review of Systems  Respiratory:  Positive for cough.   Gastrointestinal:  Positive for abdominal pain, diarrhea (feeling better), heartburn and nausea.      Objective:    Physical Exam Constitutional:      General: She is not in acute distress. HENT:     Head: Normocephalic and atraumatic.     Right Ear: External ear normal.     Left Ear: External ear normal.  Eyes:     Conjunctiva/sclera: Conjunctivae normal.  Cardiovascular:     Rate and Rhythm: Normal rate and regular rhythm.     Heart sounds: Normal heart sounds. No murmur heard. Pulmonary:     Effort: No respiratory distress.     Breath sounds: Normal breath sounds.  Abdominal:     General: Bowel sounds are normal. There is no distension.     Palpations: Abdomen is soft.     Tenderness: There is no abdominal tenderness.  Musculoskeletal:     Cervical back: Neck supple.  Lymphadenopathy:     Cervical: No cervical adenopathy.  Skin:    General: Skin is warm and dry.  Neurological:     Mental Status: She is alert and  oriented to person, place, and time.  Psychiatric:        Behavior: Behavior normal.    BP 124/78   Pulse 95   Temp 98.2 F (36.8 C)   Resp 16   Wt 203 lb 3.2 oz (92.2 kg)   SpO2 95%   BMI 32.80 kg/m  Wt Readings from Last 3 Encounters:  06/02/21 203 lb 3.2 oz (92.2 kg)  05/28/21 202 lb 9.6 oz (91.9 kg)  04/03/21 201 lb (91.2 kg)    Diabetic Foot Exam - Simple   No data filed    Lab Results  Component Value Date   WBC 2.4 Repeated and verified X2. (L) 06/02/2021   HGB 11.7 (L) 06/02/2021   HCT 36.1 06/02/2021   PLT 107.0 (L) 06/02/2021   GLUCOSE 106 (H) 06/02/2021   CHOL 176 06/02/2021   TRIG 156.0 (H) 06/02/2021   HDL 43.20 06/02/2021   LDLCALC 102 (H) 06/02/2021   ALT 13 06/02/2021   AST 22 06/02/2021   NA 141 06/02/2021   K 3.9 06/02/2021   CL 108 06/02/2021   CREATININE 0.85 06/02/2021   BUN 20 06/02/2021   CO2 25 06/02/2021   TSH 2.85 06/02/2021   INR 0.90 07/28/2018   HGBA1C 5.9 06/02/2021    Lab Results  Component Value Date   TSH 2.85 06/02/2021   Lab Results  Component Value Date   WBC 2.4 Repeated and verified X2. (L) 06/02/2021   HGB 11.7 (  L) 06/02/2021   HCT 36.1 06/02/2021   MCV 89.8 06/02/2021   PLT 107.0 (L) 06/02/2021   Lab Results  Component Value Date   NA 141 06/02/2021   K 3.9 06/02/2021   CHLORIDE 108 12/31/2016   CO2 25 06/02/2021   GLUCOSE 106 (H) 06/02/2021   BUN 20 06/02/2021   CREATININE 0.85 06/02/2021   BILITOT 0.3 06/02/2021   ALKPHOS 45 06/02/2021   AST 22 06/02/2021   ALT 13 06/02/2021   PROT 7.4 06/02/2021   ALBUMIN 3.7 06/02/2021   CALCIUM 9.0 06/02/2021   ANIONGAP 8 04/03/2021   EGFR >90 12/31/2016   GFR 71.10 06/02/2021   Lab Results  Component Value Date   CHOL 176 06/02/2021   Lab Results  Component Value Date   HDL 43.20 06/02/2021   Lab Results  Component Value Date   LDLCALC 102 (H) 06/02/2021   Lab Results  Component Value Date   TRIG 156.0 (H) 06/02/2021   Lab Results  Component  Value Date   CHOLHDL 4 06/02/2021   Lab Results  Component Value Date   HGBA1C 5.9 06/02/2021       Assessment & Plan:   Problem List Items Addressed This Visit     GERD (gastroesophageal reflux disease) - Primary    Avoid offending foods, start probiotics. Do not eat large meals in late evening and consider raising head of bed. She has been flared lately. She is following with gastroenterology and Dicyclomine and Omeprazole have helped her symptoms some       Relevant Orders   Ambulatory referral to Gastroenterology   Hyperlipidemia   Relevant Orders   Lipid panel (Completed)   Hypertension    Well controlled, no changes to meds. Encouraged heart healthy diet such as the DASH diet and exercise as tolerated.        Relevant Orders   CBC (Completed)   Comprehensive metabolic panel (Completed)   TSH (Completed)   AP (abdominal pain)    This is flared with increased diarrhea and reflux. She has been seen by GI and the Dicyclomine they have given her has helped some. Contact GI if worsens        Relevant Orders   Ambulatory referral to Gastroenterology   Thyroid nodule   Hyperglycemia    hgba1c acceptable, minimize simple carbs. Increase exercise as tolerated.        Relevant Orders   Hemoglobin A1c (Completed)   Low vitamin D level   Relevant Orders   VITAMIN D 25 Hydroxy (Vit-D Deficiency, Fractures) (Completed)   Other Visit Diagnoses     Need for pneumococcal vaccination       Relevant Orders   Pneumococcal conjugate vaccine 13-valent (Completed)        Meds ordered this encounter  Medications   promethazine-dextromethorphan (PROMETHAZINE-DM) 6.25-15 MG/5ML syrup    Sig: Take 5 mLs by mouth 3 (three) times daily as needed for cough.    Dispense:  240 mL    Refill:  1    I, Mosie Lukes, MD, personally preformed the services described in this documentation.  All medical record entries made by the scribe were at my direction and in my presence.  I  have reviewed the chart and discharge instructions (if applicable) and agree that the record reflects my personal performance and is accurate and complete. 06/02/2021   I,Tami [name],acting as a scribe for Penni Homans, MD.,have documented all relevant documentation on the behalf of Penni Homans, MD,as directed  by  Penni Homans, MD while in the presence of Penni Homans, MD.    Penni Homans, MD

## 2021-06-03 ENCOUNTER — Telehealth: Payer: Self-pay | Admitting: Family Medicine

## 2021-06-03 DIAGNOSIS — Z950 Presence of cardiac pacemaker: Secondary | ICD-10-CM | POA: Diagnosis not present

## 2021-06-03 DIAGNOSIS — C9000 Multiple myeloma not having achieved remission: Secondary | ICD-10-CM | POA: Diagnosis not present

## 2021-06-03 LAB — TSH: TSH: 2.85 u[IU]/mL (ref 0.35–5.50)

## 2021-06-03 LAB — COMPREHENSIVE METABOLIC PANEL
ALT: 13 U/L (ref 0–35)
AST: 22 U/L (ref 0–37)
Albumin: 3.7 g/dL (ref 3.5–5.2)
Alkaline Phosphatase: 45 U/L (ref 39–117)
BUN: 20 mg/dL (ref 6–23)
CO2: 25 mEq/L (ref 19–32)
Calcium: 9 mg/dL (ref 8.4–10.5)
Chloride: 108 mEq/L (ref 96–112)
Creatinine, Ser: 0.85 mg/dL (ref 0.40–1.20)
GFR: 71.1 mL/min (ref >60.00)
Glucose, Bld: 106 mg/dL — ABNORMAL HIGH (ref 70–99)
Potassium: 3.9 mEq/L (ref 3.5–5.1)
Sodium: 141 mEq/L (ref 135–145)
Total Bilirubin: 0.3 mg/dL (ref 0.2–1.2)
Total Protein: 7.4 g/dL (ref 6.0–8.3)

## 2021-06-03 LAB — CBC
HCT: 36.1 % (ref 36.0–46.0)
Hemoglobin: 11.7 g/dL — ABNORMAL LOW (ref 12.0–15.0)
MCHC: 32.5 g/dL (ref 30.0–36.0)
MCV: 89.8 fl (ref 78.0–100.0)
Platelets: 107 10*3/uL — ABNORMAL LOW (ref 150.0–400.0)
RBC: 4.02 Mil/uL (ref 3.87–5.11)
RDW: 13.7 % (ref 11.5–15.5)
WBC: 2.4 10*3/uL — ABNORMAL LOW (ref 4.0–10.5)

## 2021-06-03 LAB — LIPID PANEL
Cholesterol: 176 mg/dL (ref 0–200)
HDL: 43.2 mg/dL (ref >39.00)
LDL Cholesterol: 102 mg/dL — ABNORMAL HIGH (ref 0–99)
NonHDL: 133.11
Total CHOL/HDL Ratio: 4
Triglycerides: 156 mg/dL — ABNORMAL HIGH (ref 0.0–149.0)
VLDL: 31.2 mg/dL (ref 0.0–40.0)

## 2021-06-03 LAB — HEMOGLOBIN A1C: Hgb A1c MFr Bld: 5.9 % (ref 4.6–6.5)

## 2021-06-03 LAB — VITAMIN D 25 HYDROXY (VIT D DEFICIENCY, FRACTURES): VITD: 29.87 ng/mL — ABNORMAL LOW (ref 30.00–100.00)

## 2021-06-03 NOTE — Telephone Encounter (Signed)
Patient wants to let you know she's been having diarrhea since 06:30am (5 times)

## 2021-06-03 NOTE — Telephone Encounter (Signed)
Pt aware.

## 2021-06-04 DIAGNOSIS — R112 Nausea with vomiting, unspecified: Secondary | ICD-10-CM | POA: Diagnosis not present

## 2021-06-07 DIAGNOSIS — Z7951 Long term (current) use of inhaled steroids: Secondary | ICD-10-CM | POA: Diagnosis not present

## 2021-06-07 DIAGNOSIS — N3000 Acute cystitis without hematuria: Secondary | ICD-10-CM | POA: Diagnosis not present

## 2021-06-07 DIAGNOSIS — R197 Diarrhea, unspecified: Secondary | ICD-10-CM | POA: Diagnosis not present

## 2021-06-07 DIAGNOSIS — R935 Abnormal findings on diagnostic imaging of other abdominal regions, including retroperitoneum: Secondary | ICD-10-CM | POA: Diagnosis not present

## 2021-06-07 DIAGNOSIS — Z9049 Acquired absence of other specified parts of digestive tract: Secondary | ICD-10-CM | POA: Diagnosis not present

## 2021-06-07 DIAGNOSIS — R1030 Lower abdominal pain, unspecified: Secondary | ICD-10-CM | POA: Diagnosis not present

## 2021-06-07 DIAGNOSIS — I700 Atherosclerosis of aorta: Secondary | ICD-10-CM | POA: Diagnosis not present

## 2021-06-07 DIAGNOSIS — R1013 Epigastric pain: Secondary | ICD-10-CM | POA: Diagnosis not present

## 2021-06-07 DIAGNOSIS — I5042 Chronic combined systolic (congestive) and diastolic (congestive) heart failure: Secondary | ICD-10-CM | POA: Diagnosis not present

## 2021-06-07 DIAGNOSIS — R1310 Dysphagia, unspecified: Secondary | ICD-10-CM | POA: Diagnosis not present

## 2021-06-07 DIAGNOSIS — E785 Hyperlipidemia, unspecified: Secondary | ICD-10-CM | POA: Diagnosis not present

## 2021-06-07 DIAGNOSIS — F32A Depression, unspecified: Secondary | ICD-10-CM | POA: Diagnosis not present

## 2021-06-07 DIAGNOSIS — Z8673 Personal history of transient ischemic attack (TIA), and cerebral infarction without residual deficits: Secondary | ICD-10-CM | POA: Diagnosis not present

## 2021-06-07 DIAGNOSIS — Z950 Presence of cardiac pacemaker: Secondary | ICD-10-CM | POA: Diagnosis not present

## 2021-06-07 DIAGNOSIS — I110 Hypertensive heart disease with heart failure: Secondary | ICD-10-CM | POA: Diagnosis not present

## 2021-06-07 DIAGNOSIS — Z833 Family history of diabetes mellitus: Secondary | ICD-10-CM | POA: Diagnosis not present

## 2021-06-07 DIAGNOSIS — I447 Left bundle-branch block, unspecified: Secondary | ICD-10-CM | POA: Diagnosis not present

## 2021-06-07 DIAGNOSIS — Z83438 Family history of other disorder of lipoprotein metabolism and other lipidemia: Secondary | ICD-10-CM | POA: Diagnosis not present

## 2021-06-07 DIAGNOSIS — R112 Nausea with vomiting, unspecified: Secondary | ICD-10-CM | POA: Diagnosis not present

## 2021-06-07 DIAGNOSIS — N3090 Cystitis, unspecified without hematuria: Secondary | ICD-10-CM | POA: Diagnosis not present

## 2021-06-07 DIAGNOSIS — Z7982 Long term (current) use of aspirin: Secondary | ICD-10-CM | POA: Diagnosis not present

## 2021-06-07 DIAGNOSIS — K7689 Other specified diseases of liver: Secondary | ICD-10-CM | POA: Diagnosis not present

## 2021-06-07 DIAGNOSIS — Z809 Family history of malignant neoplasm, unspecified: Secondary | ICD-10-CM | POA: Diagnosis not present

## 2021-06-07 DIAGNOSIS — R1110 Vomiting, unspecified: Secondary | ICD-10-CM | POA: Diagnosis not present

## 2021-06-07 DIAGNOSIS — Z8249 Family history of ischemic heart disease and other diseases of the circulatory system: Secondary | ICD-10-CM | POA: Diagnosis not present

## 2021-06-07 DIAGNOSIS — Z1612 Extended spectrum beta lactamase (ESBL) resistance: Secondary | ICD-10-CM | POA: Diagnosis not present

## 2021-06-07 DIAGNOSIS — J449 Chronic obstructive pulmonary disease, unspecified: Secondary | ICD-10-CM | POA: Diagnosis not present

## 2021-06-07 DIAGNOSIS — K219 Gastro-esophageal reflux disease without esophagitis: Secondary | ICD-10-CM | POA: Diagnosis not present

## 2021-06-07 DIAGNOSIS — R109 Unspecified abdominal pain: Secondary | ICD-10-CM | POA: Diagnosis not present

## 2021-06-07 DIAGNOSIS — K529 Noninfective gastroenteritis and colitis, unspecified: Secondary | ICD-10-CM | POA: Diagnosis not present

## 2021-06-08 DIAGNOSIS — I447 Left bundle-branch block, unspecified: Secondary | ICD-10-CM | POA: Diagnosis not present

## 2021-06-08 NOTE — Assessment & Plan Note (Signed)
Well controlled, no changes to meds. Encouraged heart healthy diet such as the DASH diet and exercise as tolerated.  °

## 2021-06-08 NOTE — Assessment & Plan Note (Signed)
hgba1c acceptable, minimize simple carbs. Increase exercise as tolerated.  

## 2021-06-08 NOTE — Assessment & Plan Note (Addendum)
Avoid offending foods, start probiotics. Do not eat large meals in late evening and consider raising head of bed. She has been flared lately. She is following with gastroenterology and Dicyclomine and Omeprazole have helped her symptoms some

## 2021-06-08 NOTE — Assessment & Plan Note (Signed)
This is flared with increased diarrhea and reflux. She has been seen by GI and the Dicyclomine they have given her has helped some. Contact GI if worsens

## 2021-06-09 ENCOUNTER — Other Ambulatory Visit: Payer: Medicare Other

## 2021-06-09 ENCOUNTER — Other Ambulatory Visit: Payer: Self-pay

## 2021-06-09 DIAGNOSIS — R197 Diarrhea, unspecified: Secondary | ICD-10-CM

## 2021-06-09 DIAGNOSIS — I5042 Chronic combined systolic (congestive) and diastolic (congestive) heart failure: Secondary | ICD-10-CM | POA: Diagnosis not present

## 2021-06-09 DIAGNOSIS — K529 Noninfective gastroenteritis and colitis, unspecified: Secondary | ICD-10-CM | POA: Diagnosis not present

## 2021-06-09 DIAGNOSIS — E785 Hyperlipidemia, unspecified: Secondary | ICD-10-CM | POA: Diagnosis not present

## 2021-06-09 DIAGNOSIS — K219 Gastro-esophageal reflux disease without esophagitis: Secondary | ICD-10-CM | POA: Diagnosis not present

## 2021-06-09 DIAGNOSIS — J449 Chronic obstructive pulmonary disease, unspecified: Secondary | ICD-10-CM | POA: Diagnosis not present

## 2021-06-09 DIAGNOSIS — Z1612 Extended spectrum beta lactamase (ESBL) resistance: Secondary | ICD-10-CM | POA: Diagnosis not present

## 2021-06-09 DIAGNOSIS — I110 Hypertensive heart disease with heart failure: Secondary | ICD-10-CM | POA: Diagnosis not present

## 2021-06-09 DIAGNOSIS — N3000 Acute cystitis without hematuria: Secondary | ICD-10-CM | POA: Diagnosis not present

## 2021-06-09 NOTE — Progress Notes (Signed)
Left message on machine to call back  

## 2021-06-10 DIAGNOSIS — K529 Noninfective gastroenteritis and colitis, unspecified: Secondary | ICD-10-CM | POA: Diagnosis not present

## 2021-06-10 DIAGNOSIS — J449 Chronic obstructive pulmonary disease, unspecified: Secondary | ICD-10-CM | POA: Diagnosis not present

## 2021-06-10 DIAGNOSIS — I5042 Chronic combined systolic (congestive) and diastolic (congestive) heart failure: Secondary | ICD-10-CM | POA: Diagnosis not present

## 2021-06-10 DIAGNOSIS — E785 Hyperlipidemia, unspecified: Secondary | ICD-10-CM | POA: Diagnosis not present

## 2021-06-10 DIAGNOSIS — K219 Gastro-esophageal reflux disease without esophagitis: Secondary | ICD-10-CM | POA: Diagnosis not present

## 2021-06-10 DIAGNOSIS — I110 Hypertensive heart disease with heart failure: Secondary | ICD-10-CM | POA: Diagnosis not present

## 2021-06-10 DIAGNOSIS — N3000 Acute cystitis without hematuria: Secondary | ICD-10-CM | POA: Diagnosis not present

## 2021-06-11 ENCOUNTER — Other Ambulatory Visit: Payer: Self-pay

## 2021-06-11 DIAGNOSIS — D72829 Elevated white blood cell count, unspecified: Secondary | ICD-10-CM

## 2021-06-11 DIAGNOSIS — N3000 Acute cystitis without hematuria: Secondary | ICD-10-CM | POA: Diagnosis not present

## 2021-06-11 DIAGNOSIS — K529 Noninfective gastroenteritis and colitis, unspecified: Secondary | ICD-10-CM | POA: Diagnosis not present

## 2021-06-11 DIAGNOSIS — I5042 Chronic combined systolic (congestive) and diastolic (congestive) heart failure: Secondary | ICD-10-CM | POA: Diagnosis not present

## 2021-06-12 DIAGNOSIS — R197 Diarrhea, unspecified: Secondary | ICD-10-CM | POA: Diagnosis not present

## 2021-06-12 DIAGNOSIS — R1013 Epigastric pain: Secondary | ICD-10-CM | POA: Diagnosis not present

## 2021-06-12 DIAGNOSIS — N3000 Acute cystitis without hematuria: Secondary | ICD-10-CM | POA: Diagnosis not present

## 2021-06-12 DIAGNOSIS — K219 Gastro-esophageal reflux disease without esophagitis: Secondary | ICD-10-CM | POA: Diagnosis not present

## 2021-06-12 DIAGNOSIS — R1310 Dysphagia, unspecified: Secondary | ICD-10-CM | POA: Diagnosis not present

## 2021-06-12 DIAGNOSIS — K529 Noninfective gastroenteritis and colitis, unspecified: Secondary | ICD-10-CM | POA: Diagnosis not present

## 2021-06-12 DIAGNOSIS — I110 Hypertensive heart disease with heart failure: Secondary | ICD-10-CM | POA: Diagnosis not present

## 2021-06-12 DIAGNOSIS — E785 Hyperlipidemia, unspecified: Secondary | ICD-10-CM | POA: Diagnosis not present

## 2021-06-12 DIAGNOSIS — R935 Abnormal findings on diagnostic imaging of other abdominal regions, including retroperitoneum: Secondary | ICD-10-CM | POA: Diagnosis not present

## 2021-06-12 DIAGNOSIS — J449 Chronic obstructive pulmonary disease, unspecified: Secondary | ICD-10-CM | POA: Diagnosis not present

## 2021-06-12 DIAGNOSIS — I5042 Chronic combined systolic (congestive) and diastolic (congestive) heart failure: Secondary | ICD-10-CM | POA: Diagnosis not present

## 2021-06-12 DIAGNOSIS — R112 Nausea with vomiting, unspecified: Secondary | ICD-10-CM | POA: Diagnosis not present

## 2021-06-12 DIAGNOSIS — R1030 Lower abdominal pain, unspecified: Secondary | ICD-10-CM | POA: Diagnosis not present

## 2021-06-12 LAB — OVA AND PARASITE EXAMINATION
CONCENTRATE RESULT:: NONE SEEN
MICRO NUMBER:: 12185545
SPECIMEN QUALITY:: ADEQUATE
TRICHROME RESULT:: NONE SEEN

## 2021-06-13 DIAGNOSIS — K219 Gastro-esophageal reflux disease without esophagitis: Secondary | ICD-10-CM | POA: Diagnosis not present

## 2021-06-13 DIAGNOSIS — E785 Hyperlipidemia, unspecified: Secondary | ICD-10-CM | POA: Diagnosis not present

## 2021-06-13 DIAGNOSIS — K529 Noninfective gastroenteritis and colitis, unspecified: Secondary | ICD-10-CM | POA: Diagnosis not present

## 2021-06-13 DIAGNOSIS — J449 Chronic obstructive pulmonary disease, unspecified: Secondary | ICD-10-CM | POA: Diagnosis not present

## 2021-06-13 DIAGNOSIS — I5042 Chronic combined systolic (congestive) and diastolic (congestive) heart failure: Secondary | ICD-10-CM | POA: Diagnosis not present

## 2021-06-13 DIAGNOSIS — I110 Hypertensive heart disease with heart failure: Secondary | ICD-10-CM | POA: Diagnosis not present

## 2021-06-13 LAB — STOOL CULTURE: E coli, Shiga toxin Assay: NEGATIVE

## 2021-06-14 DIAGNOSIS — E785 Hyperlipidemia, unspecified: Secondary | ICD-10-CM | POA: Diagnosis not present

## 2021-06-14 DIAGNOSIS — F32A Depression, unspecified: Secondary | ICD-10-CM | POA: Diagnosis not present

## 2021-06-14 DIAGNOSIS — J449 Chronic obstructive pulmonary disease, unspecified: Secondary | ICD-10-CM | POA: Diagnosis not present

## 2021-06-14 DIAGNOSIS — K219 Gastro-esophageal reflux disease without esophagitis: Secondary | ICD-10-CM | POA: Diagnosis not present

## 2021-06-14 DIAGNOSIS — I110 Hypertensive heart disease with heart failure: Secondary | ICD-10-CM | POA: Diagnosis not present

## 2021-06-14 DIAGNOSIS — K529 Noninfective gastroenteritis and colitis, unspecified: Secondary | ICD-10-CM | POA: Diagnosis not present

## 2021-06-14 DIAGNOSIS — I5042 Chronic combined systolic (congestive) and diastolic (congestive) heart failure: Secondary | ICD-10-CM | POA: Diagnosis not present

## 2021-06-15 DIAGNOSIS — I110 Hypertensive heart disease with heart failure: Secondary | ICD-10-CM | POA: Diagnosis not present

## 2021-06-15 DIAGNOSIS — J449 Chronic obstructive pulmonary disease, unspecified: Secondary | ICD-10-CM | POA: Diagnosis not present

## 2021-06-15 DIAGNOSIS — K219 Gastro-esophageal reflux disease without esophagitis: Secondary | ICD-10-CM | POA: Diagnosis not present

## 2021-06-15 DIAGNOSIS — F32A Depression, unspecified: Secondary | ICD-10-CM | POA: Diagnosis not present

## 2021-06-15 DIAGNOSIS — E785 Hyperlipidemia, unspecified: Secondary | ICD-10-CM | POA: Diagnosis not present

## 2021-06-15 DIAGNOSIS — I5042 Chronic combined systolic (congestive) and diastolic (congestive) heart failure: Secondary | ICD-10-CM | POA: Diagnosis not present

## 2021-06-18 ENCOUNTER — Other Ambulatory Visit: Payer: Self-pay

## 2021-06-18 ENCOUNTER — Other Ambulatory Visit (HOSPITAL_COMMUNITY)
Admission: RE | Admit: 2021-06-18 | Discharge: 2021-06-18 | Disposition: A | Discharge status: Home / Self Care | Payer: Medicare Other | Source: Ambulatory Visit | Attending: Internal Medicine | Admitting: Internal Medicine

## 2021-06-18 ENCOUNTER — Encounter: Payer: Self-pay | Admitting: Internal Medicine

## 2021-06-18 ENCOUNTER — Ambulatory Visit (INDEPENDENT_AMBULATORY_CARE_PROVIDER_SITE_OTHER): Payer: Medicare Other | Admitting: Internal Medicine

## 2021-06-18 VITALS — BP 152/90 | HR 89 | Temp 98.2°F | Resp 16 | Ht 66.0 in | Wt 198.4 lb

## 2021-06-18 DIAGNOSIS — R81 Glycosuria: Secondary | ICD-10-CM

## 2021-06-18 DIAGNOSIS — N3000 Acute cystitis without hematuria: Secondary | ICD-10-CM

## 2021-06-18 DIAGNOSIS — I10 Essential (primary) hypertension: Secondary | ICD-10-CM | POA: Diagnosis not present

## 2021-06-18 DIAGNOSIS — N760 Acute vaginitis: Secondary | ICD-10-CM | POA: Insufficient documentation

## 2021-06-18 DIAGNOSIS — R399 Unspecified symptoms and signs involving the genitourinary system: Secondary | ICD-10-CM

## 2021-06-18 LAB — POC URINALSYSI DIPSTICK (AUTOMATED)
Bilirubin, UA: NEGATIVE
Blood, UA: NEGATIVE
Glucose, UA: POSITIVE — AB
Ketones, UA: NEGATIVE
Nitrite, UA: NEGATIVE
Protein, UA: POSITIVE — AB
Spec Grav, UA: >=1.03 — AB (ref 1.010–1.025)
Urobilinogen, UA: 0.2 E.U./dL
pH, UA: 6 (ref 5.0–8.0)

## 2021-06-18 LAB — URINALYSIS, ROUTINE W REFLEX MICROSCOPIC
Bilirubin Urine: NEGATIVE
Hgb urine dipstick: NEGATIVE
Ketones, ur: NEGATIVE
Nitrite: NEGATIVE
RBC / HPF: NONE SEEN (ref 0–?)
Specific Gravity, Urine: >=1.03 — AB (ref 1.000–1.030)
Total Protein, Urine: NEGATIVE
Urine Glucose: NEGATIVE
Urobilinogen, UA: 0.2 (ref 0.0–1.0)
pH: 5.5 (ref 5.0–8.0)

## 2021-06-18 MED ORDER — CLOTRIMAZOLE 2 % VA CREA: 1.0000 | TOPICAL_CREAM | Freq: Every day | VAGINAL | 0 refills | Status: DC

## 2021-06-18 MED ORDER — FLUCONAZOLE 150 MG PO TABS: 150.0000 mg | ORAL_TABLET | Freq: Every day | ORAL | 0 refills | Status: DC

## 2021-06-18 NOTE — Patient Instructions (Addendum)
You have a yeast infection  Diflucan x2 days  Clotrimazole x3 days  Your BP is elevated, continue the same medications but check your BP daily.   BP GOAL is between 110/65 and  135/85. If it is consistently higher or lower, let me know  Use baby wipes after every bowel movement  Keep the appointment to see Dr. Charlett Blake as a schedule.  If he has severe symptoms let us know sooner

## 2021-06-18 NOTE — Progress Notes (Signed)
Subjective:    [name], female    DOB: Dec 05, 1955, 65 y.o.   MRN: [id number]  DOS:  06/18/2021 Type of visit - description: Acute  Recently admitted to the hospital on discharge 3 days ago, she presented with generalized abdominal pain.  Prior to admission was seen at the ER with diarrhea, C. difficile + antigen and negative for toxins. Was Rx Flagyl.  Upon admission, repeat C. difficile testing was negative, Flagyl stopped. GI consulted, endoscopies were not done due to high risk status, they felt that GI symptoms were possibly related to IBS.  Last creatinine 0.6.  She presents today with the following symptoms: Reports vaginal discomfort and itching.  Denies any vaginal discharge or rash. Still has some abdominal pain bilaterally, mostly of the lower abdomen.  Denies fever at this point, some chills. Still has some diarrhea, some blood in the stool when she wipes. Denies urinary frequency, dysuria or blood in the urine per se.    06/09/2021: Urine culture showed E. Coli  Escherichia coli ESBL Organism ID MICRO MIC SUSCEPTIBILITY   Escherichia coli ESBL Amoxicillin + K Clavulanate MICRO MIC SUSCEPTIBILITY <=8/4: Susceptible  Escherichia coli ESBL Ampicillin MICRO MIC SUSCEPTIBILITY >16: Resistant  Escherichia coli ESBL Ampicillin/Sulbactam MICRO MIC SUSCEPTIBILITY >16/8: Resistant  Escherichia coli ESBL Aztreonam MICRO MIC SUSCEPTIBILITY >16: Resistant  Escherichia coli ESBL Cefazolin MICRO MIC SUSCEPTIBILITY >16: Resistant  Escherichia coli ESBL Cefepime MICRO MIC SUSCEPTIBILITY >16: Resistant  Escherichia coli ESBL Cefotaxime MICRO MIC SUSCEPTIBILITY >32: Resistant  Escherichia coli ESBL Ceftriaxone MICRO MIC SUSCEPTIBILITY >32: Resistant  Escherichia coli ESBL Cefuroxime MICRO MIC SUSCEPTIBILITY >16: Resistant  Escherichia coli ESBL Ciprofloxacin MICRO MIC SUSCEPTIBILITY >2: Resistant  Escherichia coli ESBL Ertapenem MICRO MIC SUSCEPTIBILITY <=0.5:  Susceptible  Escherichia coli ESBL Gentamicin MICRO MIC SUSCEPTIBILITY <=2: Susceptible  Escherichia coli ESBL Meropenem MICRO MIC SUSCEPTIBILITY <=1: Susceptible  Escherichia coli ESBL Nitrofurantoin MICRO MIC SUSCEPTIBILITY 64: Intermediate  Escherichia coli ESBL Piperacillin/Tazobactam MICRO MIC SUSCEPTIBILITY <=8: Susceptible  Escherichia coli ESBL Tetracycline MICRO MIC SUSCEPTIBILITY >8: Resistant  Escherichia coli ESBL Trimethoprim/Sulfamethoxazole MICRO MIC SUSCEPTIBILITY >2/38: Resistant         Review of Systems See above   Past Medical History:  Diagnosis Date   Abnormal SPEP 07/26/2016   Arthritis    knees, hands   Back pain 07/14/2016   Bowel obstruction (HCC) 11/2014   C. 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   Multiple myeloma ([place]) 07/20/2018   Multiple myeloma not having achieved remission ([place]) 07/20/2018   Myalgia 12/31/2016   Obstructive sleep apnea 03/31/2015   SOB (shortness of breath) 04/09/2016   Stroke ([place])    TIAs   UTI (urinary tract infection)     Past Surgical History:  Procedure Laterality Date   ABDOMINAL HYSTERECTOMY     menorraghia, 2006, total   CHOLECYSTECTOMY     COLONOSCOPY  2018   [facility] per patient   ESOPHAGOGASTRODUODENOSCOPY  2018   [facility]    KNEE SURGERY     right, repair torn torn cartialage  PACEMAKER INSERTION  08/2014   TONSILLECTOMY     TUBAL LIGATION      Allergies as of 06/18/2021       Reactions   Benazepril Anaphylaxis, Swelling, Hives   angioedema Throat and lip swelling   Ondansetron Hcl  Hives   Redness and hives post IV admin on 07/05/17   Codeine Nausea And Vomiting   Morphine Hives   Redness and hives noted post IV admin on 07/05/17        Medication List        Accurate as of June 18, 2021 11:59 PM. If you have any questions, ask your nurse or doctor.          acetaminophen 500 MG tablet Commonly known as: TYLENOL Take 500 mg by mouth every 6 (six) hours as needed.   albuterol 108 (90 Base) MCG/ACT inhaler Commonly known as: VENTOLIN HFA Inhale 2 puffs into the lungs every 6 (six) hours as needed for wheezing or shortness of breath.   aspirin EC 81 MG tablet Take 1 tablet (81 mg total) by mouth daily.   budesonide-formoterol 160-4.5 MCG/ACT inhaler Commonly known as: SYMBICORT Inhale 2 puffs into the lungs 2 (two) times daily.   carvedilol 25 MG tablet Commonly known as: COREG Take 1 tablet (25 mg total) by mouth 2 (two) times daily with a meal.   clotrimazole 2 % vaginal cream Commonly known as: GYNE-LOTRIMIN 3 Place 1 Applicatorful vaginally at bedtime. Started by: Kathlene November, MD   cyclobenzaprine 10 MG tablet Commonly known as: FLEXERIL Take 1 tablet (10 mg total) by mouth at bedtime.   dicyclomine 10 MG capsule Commonly known as: BENTYL Take 1 tab every 6 hours as needed for abdominal cramping.   diphenhydrAMINE 25 mg capsule Commonly known as: BENADRYL TAKE ONE (1) CAPSULE BY MOUTH THREE (3) TIMES EACH DAY AS NEEDED   esomeprazole 40 MG capsule Commonly known as: NexIUM Take 1 capsule (40 mg total) by mouth daily.   fluconazole 150 MG tablet Commonly known as: DIFLUCAN Take 1 tablet (150 mg total) by mouth daily. Started by: Kathlene November, MD   fluticasone 50 MCG/ACT nasal spray Commonly known as: FLONASE Place 2 sprays into both nostrils daily.   furosemide 20 MG tablet Commonly known as: LASIX Take 1 tablet by mouth as directed.   IBU 800 MG tablet Generic drug: ibuprofen Take 800 mg by mouth every 6 (six) hours as  needed.   losartan 25 MG tablet Commonly known as: COZAAR Take 25 mg by mouth.   magnesium oxide 400 MG tablet Commonly known as: MAG-OX Take 1 tablet (400 mg total) by mouth 2 (two) times daily.   meclizine 12.5 MG tablet Commonly known as: ANTIVERT Take 12.5 mg by mouth 3 (three) times daily.   montelukast 10 MG tablet Commonly known as: Singulair Take 1 tablet (10 mg total) by mouth at bedtime.   MULTIVITAMIN ADULT PO Take 1 tablet by mouth daily.   promethazine 25 MG tablet Commonly known as: PHENERGAN Take 1 tablet (25 mg total) by mouth every 6 (six) hours as needed for nausea or vomiting.   promethazine-dextromethorphan 6.25-15 MG/5ML syrup Commonly known as: PROMETHAZINE-DM Take 5 mLs by mouth 3 (three) times daily as needed for cough.   sucralfate 1 g tablet Commonly known as: Carafate Take 1 tablet (1 g total) by mouth 3 (three) times daily as needed. prn   topiramate 50 MG tablet Commonly known as: Topamax Take 1 tablet (50 mg total) by  mouth 2 (two) times daily.   triamcinolone cream 0.1 % Commonly known as: KENALOG Apply topically 2 (two) times daily.   Vitamin D-3 25 MCG (1000 UT) Caps Take 1 capsule (1,000 Units total) by mouth daily.   zolpidem 5 MG tablet Commonly known as: AMBIEN Take 1 tablet (5 mg total) by mouth at bedtime as needed for sleep.           Objective:   Physical Exam BP (!) 152/90 (BP Location: Left Arm, Patient Position: Sitting, Cuff Size: Small)   Pulse 89   Temp 98.2 F (36.8 C) (Oral)   Resp 16   Ht _0  (1.676 m)   Wt 198 lb 6 oz (90 kg)   SpO2 96%   BMI 32.02 kg/m  General:   Well developed, NAD, BMI noted.  HEENT:  Normocephalic . Face symmetric, atraumatic Abdomen:  Not distended, soft, bilateral lower abdominal discomfort upon palpation without mass or rebound. GU: Vulva is slightly red. Vagina: + White discharge, speculum exam with no lesions or ulcers.  Bimanual exam: Status post hysterectomy, mild  tenderness bilaterally.  No mass. Poor hygiene noted. Skin: Not pale. Not jaundice Lower extremities: no pretibial edema bilaterally  Neurologic:  alert & oriented X3.  Speech normal, gait appropriate for age and unassisted Psych--  Cognition and judgment appear intact.  Cooperative with normal attention span and concentration.  Behavior appropriate. No anxious or depressed appearing.     Assessment      65 year old female, PMH includes multiple myeloma, GERD, stroke, CHF, OSA, among other conditions, presents with:  Vaginitis: The patient has vaginitis, will prescribe Diflucan x2 Clotrimazole 2% daily x3 Encourage better hygiene with baby wipes after a BM. UTI: The patient recently was in the hospital, diagnosed with a UTI, excerpt from the chart:  "Patient is on ertapenem, Day#5. Noted urine culture results, sensitive also to oral Augmentin. Per ID recommendations curbside, patient can be discharged on oral Augmentin or Macrobid, needs total course for 5 to 7 days." Patient however reports that the day of discharge the doctor told her that she will get another round of Ertapenem and was not discharged on oral antibiotics. U dip today show small leukocytes.  Glucose.  Send a UA urine culture, doubt she has an active UTI at this point. Glucosuria: Noted today, Per PCP HTN: Slightly elevated, recommend monitoring BPs, continue Lasix, losartan. Hospital follow-up: Already scheduled with PCP.     Time spent: 30 minutes, extensive chart review.  This visit occurred during the SARS-CoV-2 public health emergency.  Safety protocols were in place, including screening questions prior to the visit, additional usage of staff PPE, and extensive cleaning of exam room while observing appropriate contact time as indicated for disinfecting solutions.

## 2021-06-19 LAB — CERVICOVAGINAL ANCILLARY ONLY
Bacterial Vaginitis (gardnerella): NEGATIVE
Candida Glabrata: NEGATIVE
Candida Vaginitis: POSITIVE — AB
Comment: NEGATIVE
Comment: NEGATIVE
Comment: NEGATIVE

## 2021-06-19 LAB — URINE CULTURE
MICRO NUMBER:: 12225407
SPECIMEN QUALITY:: ADEQUATE

## 2021-06-23 MED ORDER — FLUCONAZOLE 150 MG PO TABS: 150.0000 mg | ORAL_TABLET | Freq: Every day | ORAL | 0 refills | Status: DC

## 2021-06-23 NOTE — Addendum Note (Signed)
Addended byDamita Dunnings D on: 06/23/2021 11:48 AM   Modules accepted: Orders

## 2021-07-01 ENCOUNTER — Encounter: Payer: Self-pay | Admitting: Family Medicine

## 2021-07-01 ENCOUNTER — Other Ambulatory Visit: Payer: Self-pay

## 2021-07-01 ENCOUNTER — Ambulatory Visit (INDEPENDENT_AMBULATORY_CARE_PROVIDER_SITE_OTHER): Payer: Medicare Other | Admitting: Family Medicine

## 2021-07-01 VITALS — BP 132/88 | HR 102 | Temp 98.4°F | Resp 16 | Wt 198.8 lb

## 2021-07-01 DIAGNOSIS — R739 Hyperglycemia, unspecified: Secondary | ICD-10-CM

## 2021-07-01 DIAGNOSIS — G4733 Obstructive sleep apnea (adult) (pediatric): Secondary | ICD-10-CM | POA: Diagnosis not present

## 2021-07-01 DIAGNOSIS — E876 Hypokalemia: Secondary | ICD-10-CM

## 2021-07-01 DIAGNOSIS — E785 Hyperlipidemia, unspecified: Secondary | ICD-10-CM

## 2021-07-01 DIAGNOSIS — R109 Unspecified abdominal pain: Secondary | ICD-10-CM | POA: Diagnosis not present

## 2021-07-01 DIAGNOSIS — A0472 Enterocolitis due to Clostridium difficile, not specified as recurrent: Secondary | ICD-10-CM

## 2021-07-01 DIAGNOSIS — N390 Urinary tract infection, site not specified: Secondary | ICD-10-CM | POA: Diagnosis not present

## 2021-07-01 DIAGNOSIS — R790 Abnormal level of blood mineral: Secondary | ICD-10-CM

## 2021-07-01 DIAGNOSIS — I10 Essential (primary) hypertension: Secondary | ICD-10-CM | POA: Diagnosis not present

## 2021-07-01 DIAGNOSIS — R7989 Other specified abnormal findings of blood chemistry: Secondary | ICD-10-CM

## 2021-07-01 MED ORDER — VANCOMYCIN HCL 125 MG PO CAPS: 125.0000 mg | ORAL_CAPSULE | Freq: Four times a day (QID) | ORAL | 0 refills | Status: AC

## 2021-07-01 NOTE — Patient Instructions (Signed)
https://www.cdc.gov/cdiff/index.html">  Clostridioides Difficile Infection Clostridioides difficile infection, also known as C. difficile or C. diff infection, happens when too much C. diff bacteria grows. This can cause severe diarrhea and inflammation of the colon (colitis). It is linked to recent use of antibiotic medicine. This infection can be passed from person to person (is contagious). You also may be exposed to the bacteria from contact with food, water, orsurfaces that have the bacteria on them. What are the causes? Certain bacteria live in the colon and help to digest food. This infection develops when the balance of helpful bacteria in the colon changes and the C. diffbacteria grow out of control. This is often caused by taking antibiotics. What increases the risk? You may be more likely to develop this condition if you: Take certain antibiotics that kill many types of bacteria or take antibiotics for a long time. Have an extended stay in a hospital or long-term care facility. Are older than age 21. Have had a C. diff infection before or have been exposed to C. diff bacteria. Have a weakened disease-fighting system (immune system). Take a medicine to reduce stomach acid, such as a proton pump inhibitor, for a long time. Have a serious underlying condition, such as colon cancer or inflammatory bowel disease (IBD). Have had a gastrointestinal (GI) tract procedure. What are the signs or symptoms? Symptoms of this condition include: Diarrhea (three or more times a day) for several days. Fever. Loss of appetite. Nausea. Swelling, pain, cramping, or tenderness in the abdomen. How is this diagnosed? This condition is diagnosed with: Your medical history and a physical exam. Tests, which may include: A test for C. diff in your stool (feces). Blood tests. Imaging tests, such as a CT scan of your abdomen. A procedure in which your colon is examined. This is rare. How is this  treated? Treatment for this condition may include: Stopping the antibiotics that you were taking when the C. diff infection began. Do this only as told by your health care provider. Taking certain antibiotics to stop C. diff growth. Taking stool from a healthy person and placing it into your colon (fecal transplant). This may be done if the infection keeps coming back. Having surgery to remove the infected part of the colon. This is rare. Follow these instructions at home: Medicines Take over-the-counter and prescription medicines only as told by your health care provider. Take antibiotic medicine as told by your health care provider. Do not stop taking the antibiotic even if you start to feel better. Do not treat diarrhea with medicines unless your health care provider tells you to. Eating and drinking  Follow instructions from your health care provider about eating and drinking restrictions. Eat bland foods in small amounts that are easy to digest. These include bananas, applesauce, rice, lean meats, toast, and crackers. Follow instructions on replacing body fluid that has been lost (rehydrate). This may include: Drinking clear fluids, such as water, clear fruit juice that is diluted with water, and low-calorie sports drinks. Sucking on ice chips. Taking an oral rehydration solution (ORS). This drink is sold at pharmacies and retail stores. Avoid milk, caffeine, and alcohol. Drink enough fluid to keep your urine pale yellow.  General instructions Wash your hands often with soap and water for at least 20 seconds. Bathe or shower using soap and water daily. Return to your normal activities as told by your health care provider. Ask your health care provider what activities are safe for you. Be sure your home  is clean before you leave the hospital or clinic to go home. Then continue daily cleaning for at least a week. Keep all follow-up visits. This is important. How is this prevented? Hand  hygiene  Wash your hands with soap and water for at least 20 seconds before preparing food and after using the bathroom. Make sure the people you live with also wash their hands often with soap and water for at least 20 seconds. If you are being treated at a hospital or clinic, make sure that all health care providers and visitors wash their hands with soap and water before touching you.  Contact precautions Tell your health care team right away if you develop diarrhea while in a hospital or long-term care facility. When visiting someone in a hospital or a long-term care facility, follow guidelines for wearing a gown, gloves, or other protective equipment. If possible, avoid contact with people who have diarrhea. Use a separate bathroom if you are sick and live with other people, if possible. Clean environment Clean surfaces that are touched often every day. C. diff bacteria are killed only by cleaning products that contain 10% chlorine bleach solution. Be sure to: Read the product's label to make sure the product will kill the bacteria on the surface you are cleaning. Clean frequently touched surfaces, such as toilet seats and flush handles, bathtubs, sinks, doorknobs, and work surfaces. If you are in the hospital, make sure that staff members clean the surfaces in your room daily. Let a staff person know right away if body fluids have splashed or spilled. Washing clothes and linens Use a powder laundry detergent containing chlorine bleach instead of liquid detergent. Powder detergents contain chlorine bleach in low levels to help kill bacteria. Run your empty washing machine on the hot setting once a month with enough detergent for a full load. This will kill any remaining C. diff bacteria. Contact a health care provider if: Your symptoms do not get better, or they get worse, even with treatment. Your symptoms go away and then come back. You have a fever. You develop new symptoms. Get help  right away if: You have more pain or tenderness in your abdomen. You have stool that is mostly bloody, or looks black and tarry. You cannot eat or drink without vomiting. You have signs of dehydration, such as: Dark urine, very little urine, or no urine. Cracked lips or dry mouth. Not making tears when you cry. Sunken eyes. Sleepiness. Weakness or dizziness. Summary Clostridioides difficile infection, or C. diff infection, can cause severe diarrhea and inflammation of the colon (colitis). It is linked to recent antibiotic use. C. diff infection can spread from person to person (is contagious). You also may be exposed to the bacteria from contact with food, water, or surfaces that have the bacteria on them. This infection may be treated by stopping the antibiotics you were using when the infection began. Fecal transplant or surgery may be needed for repeat or severe infections. Washing hands with soap and water for at least 20 seconds after you use the bathroom and before you eat, and cleaning surfaces with a 10% bleach solution, can help prevent or limit spread of this infection. This information is not intended to replace advice given to you by your health care provider. Make sure you discuss any questions you have with your healthcare provider. Document Revised: 02/15/2020 Document Reviewed: 02/15/2020 Elsevier Patient Education  [place].

## 2021-07-01 NOTE — Progress Notes (Signed)
[name], female    DOB: 13-Nov-1955  Age: 65 y.o. MRN: [id number]    Subjective:  Subjective  HPI Tami [name] presents for office visit today for follow up on recent ER visit and GI trouble. She states that she was dx with C. Diff when she visited the ER on 08/01 which she received Metronidazole for. She was also dx with a UTI and was px vancomycin for treatment. She states that she then tested negative for C. Diff, but still had the UTI. She reports that atm she has loose bowel movements which she takes a binding powder for. She reports that she experiences stomach pain and cramping. She states that the pain comes and goes. However, she could not find a pattern, it usually starts in the morning, then in the evening she experiences diarrhea. She states that they were planning on doing an upper endoscopy, but were worried about risk due to sleep apnea. As a result, they had an xray done. Denies CP/palp/SOB/HA/congestion/fevers/GI or GU c/o. Taking meds as prescribed.   Review of Systems  Constitutional:  Negative for chills, fatigue and fever.  HENT:  Negative for congestion, rhinorrhea, sinus pressure, sinus pain and sore throat.   Eyes:  Negative for pain.  Respiratory:  Negative for cough and shortness of breath.   Cardiovascular:  Negative for chest pain, palpitations and leg swelling.  Gastrointestinal:  Positive for abdominal pain and diarrhea. Negative for blood in stool, nausea and vomiting.  Genitourinary:  Negative for decreased urine volume, dysuria, flank pain, frequency, hematuria, vaginal bleeding and vaginal discharge.  Musculoskeletal:  Negative for back pain.  Neurological:  Negative for headaches.   History Past Medical History:  Diagnosis Date   Abnormal SPEP 07/26/2016   Arthritis    knees, hands   Back pain 07/14/2016   Bowel obstruction ([place]) 11/2014   C. 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   Her family history includes Arthritis in her maternal grandfather, maternal grandmother, and mother; Cancer in her father; Colon cancer (age of onset: 29) in her father; Diabetes in her mother; Heart disease in her maternal grandfather and mother; Hyperlipidemia in her daughter and mother; Hypertension in her daughter, maternal grandfather, maternal grandmother, mother, and son; Irritable bowel syndrome in her sister.She reports that she has never smoked. She has never used smokeless tobacco. She reports that she does not drink alcohol and does not use drugs.  Current Outpatient Medications on File Prior to Visit  Medication Sig Dispense Refill   acetaminophen (TYLENOL) 500 MG tablet Take 500 mg by mouth every 6 (six) hours as needed.  albuterol (VENTOLIN HFA) 108 (90 Base) MCG/ACT inhaler Inhale 2 puffs into the lungs every 6 (six) hours as needed for wheezing or shortness of breath. 18 g 5   aspirin EC 81 MG tablet Take 1 tablet (81 mg total) by mouth daily. 90 tablet 1   budesonide-formoterol (SYMBICORT) 160-4.5 MCG/ACT inhaler Inhale 2 puffs into the lungs 2 (two) times daily. 1 each  3   carvedilol (COREG) 25 MG tablet Take 1 tablet (25 mg total) by mouth 2 (two) times daily with a meal. 180 tablet 1   Cholecalciferol (VITAMIN D-3) 25 MCG (1000 UT) CAPS Take 1 capsule (1,000 Units total) by mouth daily. 60 capsule 2   clotrimazole (GYNE-LOTRIMIN 3) 2 % vaginal cream Place 1 Applicatorful vaginally at bedtime. 21 g 0   cyclobenzaprine (FLEXERIL) 10 MG tablet Take 1 tablet (10 mg total) by mouth at bedtime. 7 tablet 0   dicyclomine (BENTYL) 10 MG capsule Take 1 tab every 6 hours as needed for abdominal cramping. 60 capsule 0   diphenhydrAMINE (BENADRYL) 25 mg capsule TAKE ONE (1) CAPSULE BY MOUTH THREE (3) TIMES EACH DAY AS NEEDED     esomeprazole (NEXIUM) 40 MG capsule Take 1 capsule (40 mg total) by mouth daily. 30 capsule 3   fluconazole (DIFLUCAN) 150 MG tablet Take 1 tablet (150 mg total) by mouth daily. 2 tablet 0   fluticasone (FLONASE) 50 MCG/ACT nasal spray Place 2 sprays into both nostrils daily. 16 g 0   furosemide (LASIX) 20 MG tablet Take 1 tablet by mouth as directed.     IBU 800 MG tablet Take 800 mg by mouth every 6 (six) hours as needed.     losartan (COZAAR) 25 MG tablet Take 25 mg by mouth.     magnesium oxide (MAG-OX) 400 MG tablet Take 1 tablet (400 mg total) by mouth 2 (two) times daily. 60 tablet 1   meclizine (ANTIVERT) 12.5 MG tablet Take 12.5 mg by mouth 3 (three) times daily.     montelukast (SINGULAIR) 10 MG tablet Take 1 tablet (10 mg total) by mouth at bedtime. 30 tablet 3   Multiple Vitamins-Minerals (MULTIVITAMIN ADULT PO) Take 1 tablet by mouth daily.     promethazine-dextromethorphan (PROMETHAZINE-DM) 6.25-15 MG/5ML syrup Take 5 mLs by mouth 3 (three) times daily as needed for cough. 240 mL 1   topiramate (TOPAMAX) 50 MG tablet Take 1 tablet (50 mg total) by mouth 2 (two) times daily. 60 tablet 3   triamcinolone cream (KENALOG) 0.1 % Apply topically 2 (two) times daily.     zolpidem (AMBIEN) 5 MG tablet Take 1 tablet (5 mg total) by mouth at  bedtime as needed for sleep. 15 tablet 1   promethazine (PHENERGAN) 25 MG tablet Take 1 tablet (25 mg total) by mouth every 6 (six) hours as needed for nausea or vomiting. (Patient not taking: No sig reported) 30 tablet 0   sucralfate (CARAFATE) 1 g tablet Take 1 tablet (1 g total) by mouth 3 (three) times daily as needed. prn (Patient not taking: No sig reported) 90 tablet 1   [DISCONTINUED] prochlorperazine (COMPAZINE) 10 MG tablet Take 1 tablet (10 mg total) by mouth every 6 (six) hours as needed (Nausea or vomiting). 30 tablet 1   No current facility-administered medications on file prior to visit.     Objective:  Objective  Physical Exam Constitutional:      Appearance: Normal appearance.  HENT:     Head: Normocephalic and atraumatic.  Right Ear: External ear normal.     Left Ear: External ear normal.  Pulmonary:     Effort: Pulmonary effort is normal.  Musculoskeletal:        General: Normal range of motion.     Cervical back: Normal range of motion.  Skin:    General: Skin is dry.  Neurological:     Mental Status: She is alert and oriented to person, place, and time.  Psychiatric:        Behavior: Behavior normal.   BP 132/88   Pulse (!) 102   Temp 98.4 F (36.9 C)   Resp 16   Wt 198 lb 12.8 oz (90.2 kg)   SpO2 99%   BMI 32.09 kg/m  Wt Readings from Last 3 Encounters:  07/01/21 198 lb 12.8 oz (90.2 kg)  06/18/21 198 lb 6 oz (90 kg)  06/02/21 203 lb 3.2 oz (92.2 kg)     Lab Results  Component Value Date   WBC 2.7 (L) 07/01/2021   HGB 11.9 (L) 07/01/2021   HCT 36.3 07/01/2021   PLT 122.0 (L) 07/01/2021   GLUCOSE 90 07/01/2021   CHOL 176 06/02/2021   TRIG 156.0 (H) 06/02/2021   HDL 43.20 06/02/2021   LDLCALC 102 (H) 06/02/2021   ALT 16 07/01/2021   AST 25 07/01/2021   NA 141 07/01/2021   K 3.9 07/01/2021   CL 105 07/01/2021   CREATININE 0.72 07/01/2021   BUN 18 07/01/2021   CO2 25 07/01/2021   TSH 2.85 06/02/2021   INR 0.90 07/28/2018   HGBA1C  5.9 06/02/2021    No results found.   Assessment & Plan:  Plan    Meds ordered this encounter  Medications   vancomycin (VANCOCIN) 125 MG capsule    Sig: Take 1 capsule (125 mg total) by mouth 4 (four) times daily for 7 days.    Dispense:  28 capsule    Refill:  0    Problem List Items Addressed This Visit     Hyperlipidemia    Encourage heart healthy diet such as MIND or DASH diet, increase exercise, avoid trans fats, simple carbohydrates and processed foods, consider a krill or fish or flaxseed oil cap daily.       Hypertension    Well controlled, no changes to meds. Encouraged heart healthy diet such as the DASH diet and exercise as tolerated.       Obstructive sleep apnea    Uses CPAP nightly      AP (abdominal pain) - Primary   Relevant Orders   CBC with Differential/Platelet (Completed)   Comprehensive metabolic panel (Completed)   Sedimentation rate (Completed)   Hypokalemia    Continue to monitor      C. difficile colitis    She has had another bout with CDiff but is not responding to the Metronidazole she was sent home from hospital on. Is switched to Vancomycin and encouraged to take probiotics.      Relevant Medications   vancomycin (VANCOCIN) 125 MG capsule   Hyperglycemia    hgba1c acceptable, minimize simple carbs. Increase exercise as tolerated.       Low magnesium level    Supplement and monitor      Urinary tract infection without hematuria    Recheck UA and urine culture      Relevant Medications   vancomycin (VANCOCIN) 125 MG capsule   Other Relevant Orders   CBC with Differential/Platelet (Completed)   Comprehensive metabolic panel (Completed)  Urinalysis (Completed)   Urine Culture   Low vitamin D level    Supplement and monitor      Other Visit Diagnoses     Clostridium difficile diarrhea       Relevant Medications   vancomycin (VANCOCIN) 125 MG capsule   Other Relevant Orders   CBC with Differential/Platelet  (Completed)   Comprehensive metabolic panel (Completed)   Sedimentation rate (Completed)       Follow-up: No follow-ups on file.  I, Suezanne Jacquet, acting as a scribe for Penni Homans, MD, have documented all relevent documentation on behalf of Penni Homans, MD, as directed by Penni Homans, MD while in the presence of Penni Homans, MD.  I, Mosie Lukes, MD personally performed the services described in this documentation. All medical record entries made by the scribe were at my direction and in my presence. I have reviewed the chart and agree that the record reflects my personal performance and is accurate and complete

## 2021-07-02 ENCOUNTER — Other Ambulatory Visit: Payer: Medicare Other

## 2021-07-02 LAB — URINE CULTURE
MICRO NUMBER:: 12279584
SPECIMEN QUALITY:: ADEQUATE

## 2021-07-02 LAB — CBC WITH DIFFERENTIAL/PLATELET
Basophils Absolute: 0 10*3/uL (ref 0.0–0.1)
Basophils Relative: 1.4 % (ref 0.0–3.0)
Eosinophils Absolute: 0.1 10*3/uL (ref 0.0–0.7)
Eosinophils Relative: 2.6 % (ref 0.0–5.0)
HCT: 36.3 % (ref 36.0–46.0)
Hemoglobin: 11.9 g/dL — ABNORMAL LOW (ref 12.0–15.0)
Lymphocytes Relative: 44.1 % (ref 12.0–46.0)
Lymphs Abs: 1.2 10*3/uL (ref 0.7–4.0)
MCHC: 32.8 g/dL (ref 30.0–36.0)
MCV: 89.8 fl (ref 78.0–100.0)
Monocytes Absolute: 0.3 10*3/uL (ref 0.1–1.0)
Monocytes Relative: 9.4 % (ref 3.0–12.0)
Neutro Abs: 1.2 10*3/uL — ABNORMAL LOW (ref 1.4–7.7)
Neutrophils Relative %: 42.5 % — ABNORMAL LOW (ref 43.0–77.0)
Platelets: 122 10*3/uL — ABNORMAL LOW (ref 150.0–400.0)
RBC: 4.04 Mil/uL (ref 3.87–5.11)
RDW: 13.6 % (ref 11.5–15.5)
WBC: 2.7 10*3/uL — ABNORMAL LOW (ref 4.0–10.5)

## 2021-07-02 LAB — URINALYSIS
Bilirubin Urine: NEGATIVE
Hgb urine dipstick: NEGATIVE
Ketones, ur: NEGATIVE
Leukocytes,Ua: NEGATIVE
Nitrite: NEGATIVE
Specific Gravity, Urine: 1.025 (ref 1.000–1.030)
Total Protein, Urine: NEGATIVE
Urine Glucose: NEGATIVE
Urobilinogen, UA: 0.2 (ref 0.0–1.0)
pH: 6 (ref 5.0–8.0)

## 2021-07-02 LAB — SEDIMENTATION RATE: Sed Rate: 104 mm/hr — ABNORMAL HIGH (ref 0–30)

## 2021-07-02 LAB — COMPREHENSIVE METABOLIC PANEL
ALT: 16 U/L (ref 0–35)
AST: 25 U/L (ref 0–37)
Albumin: 3.7 g/dL (ref 3.5–5.2)
Alkaline Phosphatase: 54 U/L (ref 39–117)
BUN: 18 mg/dL (ref 6–23)
CO2: 25 mEq/L (ref 19–32)
Calcium: 9.2 mg/dL (ref 8.4–10.5)
Chloride: 105 mEq/L (ref 96–112)
Creatinine, Ser: 0.72 mg/dL (ref 0.40–1.20)
GFR: 86.72 mL/min (ref >60.00)
Glucose, Bld: 90 mg/dL (ref 70–99)
Potassium: 3.9 mEq/L (ref 3.5–5.1)
Sodium: 141 mEq/L (ref 135–145)
Total Bilirubin: 0.3 mg/dL (ref 0.2–1.2)
Total Protein: 8 g/dL (ref 6.0–8.3)

## 2021-07-02 NOTE — Assessment & Plan Note (Signed)
Encourage heart healthy diet such as MIND or DASH diet, increase exercise, avoid trans fats, simple carbohydrates and processed foods, consider a krill or fish or flaxseed oil cap daily.  °

## 2021-07-02 NOTE — Assessment & Plan Note (Signed)
Well controlled, no changes to meds. Encouraged heart healthy diet such as the DASH diet and exercise as tolerated.  °

## 2021-07-02 NOTE — Assessment & Plan Note (Signed)
hgba1c acceptable, minimize simple carbs. Increase exercise as tolerated.  

## 2021-07-02 NOTE — Assessment & Plan Note (Signed)
She has had another bout with CDiff but is not responding to the Metronidazole she was sent home from hospital on. Is switched to Vancomycin and encouraged to take probiotics.

## 2021-07-02 NOTE — Assessment & Plan Note (Signed)
Recheck UA and urine culture

## 2021-07-02 NOTE — Assessment & Plan Note (Signed)
Continue to monitor

## 2021-07-02 NOTE — Assessment & Plan Note (Signed)
Supplement and monitor 

## 2021-07-02 NOTE — Assessment & Plan Note (Signed)
Uses CPAP nightly 

## 2021-07-03 ENCOUNTER — Inpatient Hospital Stay: Payer: Medicare Other | Attending: Hematology & Oncology

## 2021-07-03 ENCOUNTER — Inpatient Hospital Stay (HOSPITAL_BASED_OUTPATIENT_CLINIC_OR_DEPARTMENT_OTHER): Payer: Medicare Other | Admitting: Hematology & Oncology

## 2021-07-03 ENCOUNTER — Encounter: Payer: Self-pay | Admitting: Hematology & Oncology

## 2021-07-03 ENCOUNTER — Other Ambulatory Visit: Payer: Self-pay

## 2021-07-03 ENCOUNTER — Telehealth: Payer: Self-pay

## 2021-07-03 VITALS — BP 154/101 | HR 93 | Temp 98.3°F | Resp 18 | Wt 200.0 lb

## 2021-07-03 DIAGNOSIS — C9000 Multiple myeloma not having achieved remission: Secondary | ICD-10-CM | POA: Diagnosis not present

## 2021-07-03 LAB — CBC WITH DIFFERENTIAL (CANCER CENTER ONLY)
Abs Immature Granulocytes: 0 10*3/uL (ref 0.00–0.07)
Basophils Absolute: 0 10*3/uL (ref 0.0–0.1)
Basophils Relative: 2 %
Eosinophils Absolute: 0.1 10*3/uL (ref 0.0–0.5)
Eosinophils Relative: 3 %
HCT: 37.3 % (ref 36.0–46.0)
Hemoglobin: 11.9 g/dL — ABNORMAL LOW (ref 12.0–15.0)
Immature Granulocytes: 0 %
Lymphocytes Relative: 45 %
Lymphs Abs: 1 10*3/uL (ref 0.7–4.0)
MCH: 28.9 pg (ref 26.0–34.0)
MCHC: 31.9 g/dL (ref 30.0–36.0)
MCV: 90.5 fL (ref 80.0–100.0)
Monocytes Absolute: 0.2 10*3/uL (ref 0.1–1.0)
Monocytes Relative: 9 %
Neutro Abs: 1 10*3/uL — ABNORMAL LOW (ref 1.7–7.7)
Neutrophils Relative %: 41 %
Platelet Count: 113 10*3/uL — ABNORMAL LOW (ref 150–400)
RBC: 4.12 MIL/uL (ref 3.87–5.11)
RDW: 12.9 % (ref 11.5–15.5)
WBC Count: 2.3 10*3/uL — ABNORMAL LOW (ref 4.0–10.5)
nRBC: 0 % (ref 0.0–0.2)

## 2021-07-03 LAB — CMP (CANCER CENTER ONLY)
ALT: 22 U/L (ref 0–44)
AST: 28 U/L (ref 15–41)
Albumin: 3.8 g/dL (ref 3.5–5.0)
Alkaline Phosphatase: 49 U/L (ref 38–126)
Anion gap: 8 (ref 5–15)
BUN: 15 mg/dL (ref 8–23)
CO2: 27 mmol/L (ref 22–32)
Calcium: 9.5 mg/dL (ref 8.9–10.3)
Chloride: 106 mmol/L (ref 98–111)
Creatinine: 0.63 mg/dL (ref 0.44–1.00)
GFR, Estimated: >60 mL/min (ref >60)
Glucose, Bld: 104 mg/dL — ABNORMAL HIGH (ref 70–99)
Potassium: 4.1 mmol/L (ref 3.5–5.1)
Sodium: 141 mmol/L (ref 135–145)
Total Bilirubin: 0.4 mg/dL (ref 0.3–1.2)
Total Protein: 8.3 g/dL — ABNORMAL HIGH (ref 6.5–8.1)

## 2021-07-03 LAB — LACTATE DEHYDROGENASE: LDH: 209 U/L — ABNORMAL HIGH (ref 98–192)

## 2021-07-03 NOTE — Progress Notes (Signed)
Hematology and Oncology Follow Up Visit  Tami [name] [id number] [birth year] 65 y.o. 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 is here today for follow-up.  Unfortunately, she has been quite busy.  Since we last saw her, she been hospitalized a couple times.  She had a urinary tract infection.  She then had C. difficile.  She goes back to see Gastroenterology next week.  She is having this epigastric pain.  She probably is going to need a upper endoscopy.  I do have suspicion that her myeloma might be trying to come back.  Her last monoclonal spike was 1.3 g/dL.  Her IgG level was 1920 milligrams per deciliter.  Her kappa light chain was 3.5 mg/dL.  I think that if we do find that she is recurring, we probably are going to have to do elotuzimab.  I think this would be reasonable.  I know she has had a hard time with other medications.  I do not think we have to do a bone marrow biopsy on her.  Her last 1 was done 3 years ago.  She is going go to [employer] for Labor Day weekend.  I am sure that she is looking forward to this.  She has had no problems with her urine right now.  She has had a little bit of diarrhea.  Is been no problems with leg swelling.  She has had no issues with her blood pressure.  She has had no bleeding.  There is no cough or shortness of breath.  Thankfully, she has not had COVID.  Overall, her performance status is ECOG 1.      Medications:  Allergies as of 07/03/2021       Reactions   Benazepril Anaphylaxis, Swelling, Hives   angioedema Throat and lip swelling    Ondansetron Hcl Hives   Redness and hives post IV admin on 07/05/17   Codeine Nausea And Vomiting   Morphine Hives   Redness and hives noted post IV admin on 07/05/17        Medication List        Accurate as of July 03, 2021 10:04 AM. If you have any questions, ask your nurse or doctor.          acetaminophen 500 MG tablet Commonly known as: TYLENOL Take 500 mg by mouth every 6 (six) hours as needed.   acyclovir 400 MG tablet Commonly known as: ZOVIRAX Take 400 mg by mouth 2 (two) times daily.   albuterol 108 (90 Base) MCG/ACT inhaler Commonly known as: VENTOLIN HFA Inhale 2 puffs into the lungs every 6 (six) hours as needed for wheezing or shortness of breath.   aspirin EC 81 MG tablet Take 1 tablet (81 mg total) by mouth daily.   budesonide-formoterol 160-4.5 MCG/ACT inhaler Commonly known as: SYMBICORT Inhale 2 puffs into the lungs 2 (two) times daily.   carvedilol 25 MG tablet Commonly known as: COREG Take 1 tablet (25 mg total) by mouth 2 (two) times daily with a meal.   cholestyramine 4 g packet Commonly  known as: QUESTRAN Take 1 packet by mouth 2 (two) times daily.   clotrimazole 2 % vaginal cream Commonly known as: GYNE-LOTRIMIN 3 Place 1 Applicatorful vaginally at bedtime.   cyclobenzaprine 10 MG tablet Commonly known as: FLEXERIL Take 1 tablet (10 mg total) by mouth at bedtime.   dicyclomine 10 MG capsule Commonly known as: BENTYL Take 1 tab every 6 hours as needed for abdominal cramping.   diphenhydrAMINE 25 mg capsule Commonly known as: BENADRYL TAKE ONE (1) CAPSULE BY MOUTH THREE (3) TIMES EACH DAY AS NEEDED   esomeprazole 40 MG capsule Commonly known as: NexIUM Take 1 capsule (40 mg total) by mouth daily.   fluconazole 150 MG tablet Commonly known as: DIFLUCAN Take 1 tablet (150 mg total) by mouth daily.   fluticasone 50 MCG/ACT nasal spray Commonly known as: FLONASE Place 2 sprays into both nostrils daily.   furosemide 20 MG  tablet Commonly known as: LASIX Take 1 tablet by mouth as directed.   hydrOXYzine 25 MG tablet Commonly known as: ATARAX/VISTARIL Take 25 mg by mouth 3 (three) times daily.   IBU 800 MG tablet Generic drug: ibuprofen Take 800 mg by mouth every 6 (six) hours as needed.   losartan 25 MG tablet Commonly known as: COZAAR Take 25 mg by mouth.   losartan 50 MG tablet Commonly known as: COZAAR Take 75 mg by mouth daily.   magnesium oxide 400 MG tablet Commonly known as: MAG-OX Take 1 tablet (400 mg total) by mouth 2 (two) times daily.   meclizine 12.5 MG tablet Commonly known as: ANTIVERT Take 12.5 mg by mouth 3 (three) times daily.   montelukast 10 MG tablet Commonly known as: Singulair Take 1 tablet (10 mg total) by mouth at bedtime.   MULTIVITAMIN ADULT PO Take 1 tablet by mouth daily.   promethazine 25 MG tablet Commonly known as: PHENERGAN Take 1 tablet (25 mg total) by mouth every 6 (six) hours as needed for nausea or vomiting.   promethazine-dextromethorphan 6.25-15 MG/5ML syrup Commonly known as: PROMETHAZINE-DM Take 5 mLs by mouth 3 (three) times daily as needed for cough.   sucralfate 1 g tablet Commonly known as: Carafate Take 1 tablet (1 g total) by mouth 3 (three) times daily as needed. prn   topiramate 50 MG tablet Commonly known as: Topamax Take 1 tablet (50 mg total) by mouth 2 (two) times daily.   triamcinolone cream 0.1 % Commonly known as: KENALOG Apply topically 2 (two) times daily.   vancomycin 125 MG capsule Commonly known as: VANCOCIN Take 1 capsule (125 mg total) by mouth 4 (four) times daily for 7 days.   Vitamin D-3 25 MCG (1000 UT) Caps Take 1 capsule (1,000 Units total) by mouth daily.   zinc sulfate 220 (50 Zn) MG capsule Take 220 mg by mouth 2 (two) times daily.   zolpidem 5 MG tablet Commonly known as: AMBIEN Take 1 tablet (5 mg total) by mouth at bedtime as needed for sleep.        Allergies:  Allergies  Allergen  Reactions   Benazepril Anaphylaxis, Swelling and Hives    angioedema Throat and lip swelling   Ondansetron Hcl Hives    Redness and hives post IV admin on 07/05/17   Codeine Nausea And Vomiting   Morphine Hives    Redness and hives noted post IV admin on 07/05/17    Past Medical History, Surgical history, Social history, and Family History were reviewed and updated.  Review of Systems: Review of  Systems  Constitutional: Negative.   HENT: Negative.    Eyes: Negative.   Respiratory: Negative.    Cardiovascular: Negative.   Gastrointestinal: Negative.   Genitourinary: Negative.   Musculoskeletal: Negative.   Skin: Negative.   Neurological: Negative.   Endo/Heme/Allergies: Negative.   Psychiatric/Behavioral: Negative.       Physical Exam:  weight is 200 lb (90.7 kg). Her oral temperature is 98.3 F (36.8 C). Her blood pressure is 154/101 (abnormal) and her pulse is 93. Her respiration is 18 and oxygen saturation is 98%.   Wt Readings from Last 3 Encounters:  07/03/21 200 lb (90.7 kg)  07/01/21 198 lb 12.8 oz (90.2 kg)  06/18/21 198 lb 6 oz (90 kg)    Physical Exam Vitals reviewed.  HENT:     Head: Normocephalic and atraumatic.  Eyes:     Pupils: Pupils are equal, round, and reactive to light.  Cardiovascular:     Rate and Rhythm: Normal rate and regular rhythm.     Heart sounds: Normal heart sounds.  Pulmonary:     Effort: Pulmonary effort is normal.     Breath sounds: Normal breath sounds.  Abdominal:     General: Bowel sounds are normal.     Palpations: Abdomen is soft.  Musculoskeletal:        General: No tenderness or deformity. Normal range of motion.     Cervical back: Normal range of motion.  Lymphadenopathy:     Cervical: No cervical adenopathy.  Skin:    General: Skin is warm and dry.     Findings: No erythema or rash.  Neurological:     Mental Status: She is alert and oriented to person, place, and time.  Psychiatric:        Behavior: Behavior  normal.        Thought Content: Thought content normal.        Judgment: Judgment normal.     Lab Results  Component Value Date   WBC 2.3 (L) 07/03/2021   HGB 11.9 (L) 07/03/2021   HCT 37.3 07/03/2021   MCV 90.5 07/03/2021   PLT 113 (L) 07/03/2021   Lab Results  Component Value Date   FERRITIN 2,483 (H) 08/26/2020   IRON 124 08/26/2020   TIBC 284 08/26/2020   UIBC 160 08/26/2020   IRONPCTSAT 44 08/26/2020   Lab Results  Component Value Date   RETICCTPCT 0.8 07/16/2016   RBC 4.12 07/03/2021   RETICCTABS 32,640 07/16/2016   Lab Results  Component Value Date   KPAFRELGTCHN 34.7 (H) 04/03/2021   LAMBDASER 3.0 (L) 04/03/2021   KAPLAMBRATIO 11.57 (H) 04/03/2021   Lab Results  Component Value Date   IGGSERUM 1,921 (H) 04/03/2021   IGA 65 (L) 04/03/2021   IGMSERUM 52 04/03/2021   Lab Results  Component Value Date   TOTALPROTELP 7.4 04/03/2021   ALBUMINELP 3.3 04/03/2021   A1GS 0.3 04/03/2021   A2GS 1.1 (H) 04/03/2021   BETS 1.1 04/03/2021   BETA2SER 0.5 07/16/2016   GAMS 1.6 04/03/2021   MSPIKE 1.3 (H) 04/03/2021   SPEI Comment 12/19/2020     Chemistry      Component Value Date/Time   NA 141 07/03/2021 0910   NA 143 11/08/2017 1127   NA 140 12/31/2016 1000   K 4.1 07/03/2021 0910   K 4.0 11/08/2017 1127   K 3.6 12/31/2016 1000   CL 106 07/03/2021 0910   CL 107 11/08/2017 1127   CO2 27 07/03/2021 0910   CO2 25  11/08/2017 1127   CO2 24 12/31/2016 1000   BUN 15 07/03/2021 0910   BUN 19 11/08/2017 1127   BUN 22.3 12/31/2016 1000   CREATININE 0.63 07/03/2021 0910   CREATININE 0.9 11/08/2017 1127   CREATININE 0.8 12/31/2016 1000      Component Value Date/Time   CALCIUM 9.5 07/03/2021 0910   CALCIUM 9.9 11/08/2017 1127   CALCIUM 9.5 12/31/2016 1000   ALKPHOS 49 07/03/2021 0910   ALKPHOS 44 11/08/2017 1127   ALKPHOS 51 12/31/2016 1000   AST 28 07/03/2021 0910   AST 28 12/31/2016 1000   ALT 22 07/03/2021 0910   ALT 36 11/08/2017 1127   ALT 16  12/31/2016 1000   BILITOT 0.4 07/03/2021 0910   BILITOT 0.46 12/31/2016 1000       Impression and Plan: Ms. Mandeville is a very pleasant 65 yo African American female with a previous IgG kappa MGUS with progression to myeloma.   So far, she been off treatment now for about 8 months.  Again, I have a suspicion that her myeloma is coming back.  Her total protein level is up.  Again we will have to see what her myeloma studies look like.  If we have to get started with therapy, thankfully, we do not have to do this emergently.  We will probably have to get her back in about a month or so.  At that point time, I am sure that we will know whether or not she will need to be treated.    [name], MD 8/25/202210:04 AM

## 2021-07-04 LAB — KAPPA/LAMBDA LIGHT CHAINS
Kappa free light chain: 57.8 mg/L — ABNORMAL HIGH (ref 3.3–19.4)
Kappa, lambda light chain ratio: 9.03 — ABNORMAL HIGH (ref 0.26–1.65)
Lambda free light chains: 6.4 mg/L (ref 5.7–26.3)

## 2021-07-05 LAB — IGG, IGA, IGM
IgA: 98 mg/dL (ref 87–352)
IgG (Immunoglobin G), Serum: 2349 mg/dL — ABNORMAL HIGH (ref 586–1602)
IgM (Immunoglobulin M), Srm: 79 mg/dL (ref 26–217)

## 2021-07-09 ENCOUNTER — Ambulatory Visit: Payer: Medicare Other | Admitting: Gastroenterology

## 2021-07-09 LAB — PROTEIN ELECTROPHORESIS, SERUM, WITH REFLEX
A/G Ratio: 0.8 (ref 0.7–1.7)
Albumin ELP: 3.3 g/dL (ref 2.9–4.4)
Alpha-1-Globulin: 0.2 g/dL (ref 0.0–0.4)
Alpha-2-Globulin: 1 g/dL (ref 0.4–1.0)
Beta Globulin: 1.2 g/dL (ref 0.7–1.3)
Gamma Globulin: 1.9 g/dL — ABNORMAL HIGH (ref 0.4–1.8)
Globulin, Total: 4.4 g/dL — ABNORMAL HIGH (ref 2.2–3.9)
M-Spike, %: 1.6 g/dL — ABNORMAL HIGH
SPEP Interpretation: 0
Total Protein ELP: 7.7 g/dL (ref 6.0–8.5)

## 2021-07-09 LAB — IMMUNOFIXATION REFLEX, SERUM
IgA: 91 mg/dL (ref 87–352)
IgG (Immunoglobin G), Serum: 2225 mg/dL — ABNORMAL HIGH (ref 586–1602)
IgM (Immunoglobulin M), Srm: 76 mg/dL (ref 26–217)

## 2021-07-10 DIAGNOSIS — R110 Nausea: Secondary | ICD-10-CM | POA: Diagnosis not present

## 2021-07-10 DIAGNOSIS — R1013 Epigastric pain: Secondary | ICD-10-CM | POA: Diagnosis not present

## 2021-07-10 DIAGNOSIS — R6881 Early satiety: Secondary | ICD-10-CM | POA: Diagnosis not present

## 2021-07-11 DIAGNOSIS — A499 Bacterial infection, unspecified: Secondary | ICD-10-CM | POA: Diagnosis not present

## 2021-07-11 DIAGNOSIS — R300 Dysuria: Secondary | ICD-10-CM | POA: Diagnosis not present

## 2021-07-11 DIAGNOSIS — N390 Urinary tract infection, site not specified: Secondary | ICD-10-CM | POA: Diagnosis not present

## 2021-07-17 DIAGNOSIS — H04123 Dry eye syndrome of bilateral lacrimal glands: Secondary | ICD-10-CM | POA: Diagnosis not present

## 2021-07-17 DIAGNOSIS — H35713 Central serous chorioretinopathy, bilateral: Secondary | ICD-10-CM | POA: Diagnosis not present

## 2021-07-20 DIAGNOSIS — N390 Urinary tract infection, site not specified: Secondary | ICD-10-CM | POA: Diagnosis not present

## 2021-07-20 DIAGNOSIS — A499 Bacterial infection, unspecified: Secondary | ICD-10-CM | POA: Diagnosis not present

## 2021-07-23 ENCOUNTER — Telehealth: Payer: Self-pay

## 2021-07-23 NOTE — Telephone Encounter (Signed)
Lvm to call back

## 2021-07-23 NOTE — Telephone Encounter (Signed)
Pt aware.

## 2021-07-23 NOTE — Telephone Encounter (Signed)
Spoke with and she states that she is having stomach pain like when she had Cdiff and stool is soft but not runny. I asked if she has been to GI, she states that she had appointment sometime this week but I looked in the system and seen she no showed. Pt states that she was on vancomycin HCL 125 mg for Cdiff. Pt would like advice

## 2021-07-23 NOTE — Telephone Encounter (Signed)
Pt called stating she has some questions regarding an antibiotic she was given in the past and wants to discuss some other things to relay to Dr. Charlett Blake.

## 2021-07-24 DIAGNOSIS — R112 Nausea with vomiting, unspecified: Secondary | ICD-10-CM | POA: Diagnosis not present

## 2021-07-24 DIAGNOSIS — R6881 Early satiety: Secondary | ICD-10-CM | POA: Diagnosis not present

## 2021-07-25 ENCOUNTER — Encounter: Payer: Self-pay | Admitting: Family Medicine

## 2021-07-25 ENCOUNTER — Ambulatory Visit (INDEPENDENT_AMBULATORY_CARE_PROVIDER_SITE_OTHER): Payer: Medicare Other | Admitting: Family Medicine

## 2021-07-25 ENCOUNTER — Other Ambulatory Visit: Payer: Self-pay

## 2021-07-25 VITALS — BP 144/80 | HR 67 | Temp 98.7°F | Ht 66.0 in | Wt 197.4 lb

## 2021-07-25 DIAGNOSIS — R1013 Epigastric pain: Secondary | ICD-10-CM

## 2021-07-25 DIAGNOSIS — G8929 Other chronic pain: Secondary | ICD-10-CM | POA: Diagnosis not present

## 2021-07-25 MED ORDER — OMEPRAZOLE 40 MG PO CPDR: 40.0000 mg | DELAYED_RELEASE_CAPSULE | Freq: Two times a day (BID) | ORAL | 2 refills | Status: DC

## 2021-07-25 MED ORDER — PROMETHAZINE HCL 25 MG PO TABS: 25.0000 mg | ORAL_TABLET | Freq: Four times a day (QID) | ORAL | 0 refills | Status: DC | PRN

## 2021-07-25 NOTE — Progress Notes (Signed)
Chief Complaint  Patient presents with   Abdominal Pain    Tami [name] is here for sharp epigastric abdominal pain.  Duration: 1  year Nighttime awakenings? Yes Bleeding? No Weight loss? Yes; down 4-5 lbs over last mo Palliation: None Provocation: eating Associated symptoms: nausea and vomiting Denies: fever and inability to keep down fluids Treatment to date: Protonix 40 mg bid  Past Medical History:  Diagnosis Date   Abnormal SPEP 07/26/2016   Arthritis    knees, hands   Back pain 07/14/2016   Bowel obstruction ([place]) 11/2014   C. 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   Multiple myeloma ([place]) 07/20/2018   Multiple myeloma not having achieved remission ([place]) 07/20/2018   Myalgia 12/31/2016   Obstructive sleep apnea 03/31/2015   SOB (shortness of breath) 04/09/2016   Stroke (HCC)    TIAs   UTI (urinary tract infection)     BP (!) 144/80   Pulse 67   Temp 98.7 F (37.1 C) (Oral)   Ht $R'5\' 6"'uB$  (1.676 m)   Wt 197 lb 6 oz (89.5 kg)   SpO2 93%   BMI 31.86 kg/m  Gen.: Awake, alert, appears stated age Heart: Regular rate and rhythm without murmurs Lungs: Clear auscultation bilaterally, no rales or wheezing, normal effort without accessory muscle use. Abdomen: Bowel sounds are present. Abdomen is soft, diffusely ttp, worse in epigastric region, nondistended, no masses or organomegaly. Negative Murphy's, Rovsing's, McBurney's, and Carnett's sign. Psych: Age appropriate judgment and insight. Normal mood and affect.  Abdominal pain, chronic, epigastric - Plan: omeprazole (PRILOSEC) 40 MG  capsule  Chronic, uncontrolled. Change Protonix to omeprazole 40 mg bid. Cont w GI team. Cont w phenergan.  F/u as originally scheduled.  Pt voiced understanding and agreement to the plan.  [place], DO 07/25/21 9:10 AM

## 2021-07-25 NOTE — Patient Instructions (Addendum)
The only lifestyle changes that have data behind them are weight loss for the overweight/obese and elevating the head of the bed. Finding out which foods/positions are triggers is important.  Call your GI team today to discuss follow up.   Let us know if you need anything.

## 2021-08-05 DIAGNOSIS — M25561 Pain in right knee: Secondary | ICD-10-CM | POA: Diagnosis not present

## 2021-08-05 DIAGNOSIS — M25562 Pain in left knee: Secondary | ICD-10-CM | POA: Diagnosis not present

## 2021-08-05 DIAGNOSIS — G8929 Other chronic pain: Secondary | ICD-10-CM | POA: Diagnosis not present

## 2021-08-05 DIAGNOSIS — M170 Bilateral primary osteoarthritis of knee: Secondary | ICD-10-CM | POA: Diagnosis not present

## 2021-08-06 DIAGNOSIS — M170 Bilateral primary osteoarthritis of knee: Secondary | ICD-10-CM | POA: Diagnosis not present

## 2021-08-07 ENCOUNTER — Encounter: Payer: Self-pay | Admitting: Family Medicine

## 2021-08-07 ENCOUNTER — Other Ambulatory Visit: Payer: Self-pay | Admitting: Family Medicine

## 2021-08-07 ENCOUNTER — Ambulatory Visit (INDEPENDENT_AMBULATORY_CARE_PROVIDER_SITE_OTHER): Payer: Medicare Other | Admitting: Family Medicine

## 2021-08-07 ENCOUNTER — Other Ambulatory Visit: Payer: Self-pay

## 2021-08-07 VITALS — BP 122/78 | HR 94 | Temp 98.1°F | Resp 16 | Wt 202.6 lb

## 2021-08-07 DIAGNOSIS — R7989 Other specified abnormal findings of blood chemistry: Secondary | ICD-10-CM | POA: Diagnosis not present

## 2021-08-07 DIAGNOSIS — R790 Abnormal level of blood mineral: Secondary | ICD-10-CM

## 2021-08-07 DIAGNOSIS — I10 Essential (primary) hypertension: Secondary | ICD-10-CM

## 2021-08-07 DIAGNOSIS — M25561 Pain in right knee: Secondary | ICD-10-CM

## 2021-08-07 DIAGNOSIS — Z23 Encounter for immunization: Secondary | ICD-10-CM | POA: Diagnosis not present

## 2021-08-07 DIAGNOSIS — R739 Hyperglycemia, unspecified: Secondary | ICD-10-CM

## 2021-08-07 DIAGNOSIS — M25562 Pain in left knee: Secondary | ICD-10-CM | POA: Diagnosis not present

## 2021-08-07 DIAGNOSIS — E785 Hyperlipidemia, unspecified: Secondary | ICD-10-CM

## 2021-08-07 DIAGNOSIS — Z79899 Other long term (current) drug therapy: Secondary | ICD-10-CM

## 2021-08-07 DIAGNOSIS — C9000 Multiple myeloma not having achieved remission: Secondary | ICD-10-CM

## 2021-08-07 DIAGNOSIS — K2100 Gastro-esophageal reflux disease with esophagitis, without bleeding: Secondary | ICD-10-CM

## 2021-08-07 DIAGNOSIS — I429 Cardiomyopathy, unspecified: Secondary | ICD-10-CM

## 2021-08-07 MED ORDER — CELECOXIB 100 MG PO CAPS: 100.0000 mg | ORAL_CAPSULE | Freq: Two times a day (BID) | ORAL | 3 refills | Status: DC | PRN

## 2021-08-07 NOTE — Progress Notes (Signed)
Patient ID: [name], female    DOB: [birth year]  Age: 65 y.o. MRN: [id number]    Subjective:   Chief Complaint  Patient presents with   Follow-up    Stomach issue    Subjective   HPI Tami [name] presents for office visit today for follow up on HTN and GERD. She was put on a different acid reducer by Dr. Nani Ravens. She is still experiencing acid reflux, but it has improved since she switched to omeprazole 40 mg BID PO. Denies CP/palp/SOB/HA/congestion/fevers or GU c/o. Taking meds as prescribed.  She saw Dr. Linton Rump for bilateral knee pain and had her do xray. Fluid was found in bilateral knee. She initially saw him due to c/o swelling in her right knee. She received two injections and now noted that she is able to walk more.   Review of Systems  Constitutional:  Negative for chills, fatigue and fever.  HENT:  Negative for congestion, rhinorrhea, sinus pressure, sinus pain and sore throat.   Eyes:  Negative for pain.  Respiratory:  Negative for cough and shortness of breath.   Cardiovascular:  Negative for chest pain, palpitations and leg swelling.  Gastrointestinal:  Negative for abdominal pain, blood in stool, diarrhea, nausea and vomiting.  Genitourinary:  Negative for decreased urine volume, flank pain, frequency, vaginal bleeding and vaginal discharge.  Musculoskeletal:  Negative for back pain.  Neurological:  Negative for headaches.   History Past Medical History:  Diagnosis Date   Abnormal SPEP 07/26/2016   Arthritis    knees, hands   Back pain 07/14/2016   Bowel obstruction ([place]) 11/2014   C. 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   Her family history includes Arthritis in her maternal grandfather, maternal grandmother, and mother; Cancer in her father; Colon cancer (age of onset: 15) in her father; Diabetes in her mother; Heart disease in her maternal grandfather and mother; Hyperlipidemia in her daughter and mother; Hypertension in her daughter, maternal grandfather, maternal grandmother, mother, and son; Irritable bowel syndrome in her sister.She reports that she has never smoked. She has never used smokeless tobacco. She reports that she does not drink alcohol and does not use drugs.  Current Outpatient Medications on File Prior to Visit  Medication Sig Dispense Refill   acetaminophen (TYLENOL) 500 MG tablet Take 500 mg by mouth every 6 (six) hours as needed.     acyclovir (ZOVIRAX) 400 MG tablet Take 400 mg by mouth 2 (two) times daily.     albuterol (VENTOLIN HFA) 108 (90 Base) MCG/ACT inhaler Inhale 2 puffs into the lungs every 6 (six) hours as needed for  wheezing or shortness of breath. 18 g 5   aspirin EC 81 MG tablet Take 1 tablet (81 mg total) by mouth daily. 90 tablet 1   budesonide-formoterol (SYMBICORT) 160-4.5 MCG/ACT inhaler Inhale 2 puffs into the lungs 2 (two) times daily. 1 each 3   carvedilol (COREG) 25 MG tablet Take 1 tablet (25 mg total) by mouth 2 (two) times daily with a meal. 180 tablet 1   Cholecalciferol (VITAMIN D-3) 25 MCG (1000 UT) CAPS Take 1 capsule (1,000  Units total) by mouth daily. 60 capsule 2   cholestyramine (QUESTRAN) 4 g packet Take 1 packet by mouth 2 (two) times daily.     clotrimazole (GYNE-LOTRIMIN 3) 2 % vaginal cream Place 1 Applicatorful vaginally at bedtime. 21 g 0   cyclobenzaprine (FLEXERIL) 10 MG tablet Take 1 tablet (10 mg total) by mouth at bedtime. 7 tablet 0   dicyclomine (BENTYL) 10 MG capsule Take 1 tab every 6 hours as needed for abdominal cramping. 60 capsule 0   diphenhydrAMINE (BENADRYL) 25 mg capsule TAKE ONE (1) CAPSULE BY MOUTH THREE (3) TIMES EACH DAY AS NEEDED     fluticasone (FLONASE) 50 MCG/ACT nasal spray Place 2 sprays into both nostrils daily. 16 g 0   furosemide (LASIX) 20 MG tablet Take 1 tablet by mouth as directed.     hydrOXYzine (ATARAX/VISTARIL) 25 MG tablet Take 25 mg by mouth 3 (three) times daily.     losartan (COZAAR) 25 MG tablet Take 25 mg by mouth.     losartan (COZAAR) 50 MG tablet Take 75 mg by mouth daily.     magnesium oxide (MAG-OX) 400 MG tablet Take 1 tablet (400 mg total) by mouth 2 (two) times daily. 60 tablet 1   meclizine (ANTIVERT) 12.5 MG tablet Take 12.5 mg by mouth 3 (three) times daily.     montelukast (SINGULAIR) 10 MG tablet Take 1 tablet (10 mg total) by mouth at bedtime. 30 tablet 3   Multiple Vitamins-Minerals (MULTIVITAMIN ADULT PO) Take 1 tablet by mouth daily.     omeprazole (PRILOSEC) 40 MG capsule Take 1 capsule (40 mg total) by mouth in the morning and at bedtime. 60 capsule 2   promethazine (PHENERGAN) 25 MG tablet Take 1 tablet (25 mg total) by mouth every 6 (six) hours as needed for nausea or vomiting. 30 tablet 0   promethazine-dextromethorphan (PROMETHAZINE-DM) 6.25-15 MG/5ML syrup Take 5 mLs by mouth 3 (three) times daily as needed for cough. 240 mL 1   sucralfate (CARAFATE) 1 g tablet Take 1 tablet (1 g total) by mouth 3 (three) times daily as needed. prn 90 tablet 1   topiramate (TOPAMAX) 50 MG tablet Take 1 tablet (50 mg total) by mouth 2 (two) times daily. 60  tablet 3   triamcinolone cream (KENALOG) 0.1 % Apply topically 2 (two) times daily.     zinc sulfate 220 (50 Zn) MG capsule Take 220 mg by mouth 2 (two) times daily.     zolpidem (AMBIEN) 5 MG tablet Take 1 tablet (5 mg total) by mouth at bedtime as needed for sleep. 15 tablet 1   [DISCONTINUED] prochlorperazine (COMPAZINE) 10 MG tablet Take 1 tablet (10 mg total) by mouth every 6 (six) hours as needed (Nausea or vomiting). 30 tablet 1   No current facility-administered medications on file prior to visit.     Objective:  Objective  Physical Exam Constitutional:      General: She is not in acute distress.    Appearance: Normal  appearance. She is not ill-appearing or toxic-appearing.  HENT:     Head: Normocephalic and atraumatic.     Right Ear: Tympanic membrane, ear canal and external ear normal.     Left Ear: Tympanic membrane, ear canal and external ear normal.     Nose: No congestion or rhinorrhea.  Eyes:     Extraocular Movements: Extraocular movements intact.     Pupils: Pupils are equal, round, and reactive to light.  Cardiovascular:     Rate and Rhythm: Normal rate and regular rhythm.     Pulses: Normal pulses.     Heart sounds: Normal heart sounds. No murmur heard. Pulmonary:     Effort: Pulmonary effort is normal. No respiratory distress.     Breath sounds: Normal breath sounds. No wheezing, rhonchi or rales.  Abdominal:     General: Bowel sounds are normal.     Palpations: Abdomen is soft. There is no mass.     Tenderness: There is no abdominal tenderness. There is no guarding.     Hernia: No hernia is present.  Musculoskeletal:        General: Normal range of motion.     Cervical back: Normal range of motion and neck supple.  Skin:    General: Skin is warm and dry.  Neurological:     Mental Status: She is alert and oriented to person, place, and time.  Psychiatric:        Behavior: Behavior normal.   BP 122/78   Pulse 94   Temp 98.1 F (36.7 C)   Resp 16    Wt 202 lb 9.6 oz (91.9 kg)   SpO2 96%   BMI 32.70 kg/m  Wt Readings from Last 3 Encounters:  08/08/21 200 lb (90.7 kg)  08/07/21 202 lb 9.6 oz (91.9 kg)  07/25/21 197 lb 6 oz (89.5 kg)     Lab Results  Component Value Date   WBC 3.4 (L) 08/08/2021   HGB 11.9 (L) 08/08/2021   HCT 36.0 08/08/2021   PLT 114 (L) 08/08/2021   GLUCOSE 100 (H) 08/08/2021   CHOL 176 06/02/2021   TRIG 156.0 (H) 06/02/2021   HDL 43.20 06/02/2021   LDLCALC 102 (H) 06/02/2021   ALT 23 08/08/2021   AST 62 (H) 08/08/2021   NA 140 08/08/2021   K 3.6 08/08/2021   CL 105 08/08/2021   CREATININE 0.63 08/08/2021   BUN 16 08/08/2021   CO2 27 08/08/2021   TSH 2.85 06/02/2021   INR 0.90 07/28/2018   HGBA1C 5.9 06/02/2021    No results found.   Assessment & Plan:  Plan    Meds ordered this encounter  Medications   celecoxib (CELEBREX) 100 MG capsule    Sig: Take 1 capsule (100 mg total) by mouth 2 (two) times daily as needed for mild pain or moderate pain (with food).    Dispense:  60 capsule    Refill:  3    Problem List Items Addressed This Visit     Multiple myeloma not having achieved remission ([place]) (Chronic)    Continues to follow closely with oncology and is awaiting further work up now that she is off of treatments to assess for stability      Relevant Medications   celecoxib (CELEBREX) 100 MG capsule   GERD (gastroesophageal reflux disease)    Avoid offending foods, start probiotics. Do not eat large meals in late evening and consider raising head of bed. Improved symptoms with switch from Pantoprazole to  Omeprazole bid.       Hyperlipidemia    Encourage heart healthy diet such as MIND or DASH diet, increase exercise, avoid trans fats, simple carbohydrates and processed foods, consider a krill or fish or flaxseed oil cap daily.       Hypertension    Well controlled, no changes to meds. Encouraged heart healthy diet such as the DASH diet and exercise as tolerated.        Cardiomyopathy [facility])    She is scheduled for replacement of pacer/defibrillator next month      Hyperglycemia    hgba1c acceptable, minimize simple carbs. Increase exercise as tolerated.      Low magnesium level    Supplement and monitor      Low vitamin D level    Supplement and monitor      Bilateral knee pain    Is following with ortho Dr Linton Rump in [place] and they recently drained fluid and administered some steroid injections but pain persists she was given some Meloxicam but did not start it yet. With her GI symptoms will try low dose Celebrex 100 mg daily instead take with food and alternate with Tylenol      Other Visit Diagnoses     High risk medication use    -  Primary   Relevant Orders   Drug Monitoring Panel [phone] , Urine   Long-term current use of benzodiazepine       Relevant Orders   Drug Monitoring Panel [phone] , Urine   Need for influenza vaccination       Relevant Orders   Flu Vaccine QUAD High Dose(Fluad) (Completed)       Follow-up: Return in about 6 weeks (around 09/15/2021).  I, Suezanne Jacquet, acting as a scribe for Penni Homans, MD, have documented all relevent documentation on behalf of Penni Homans, MD, as directed by Penni Homans, MD while in the presence of Penni Homans, MD. DO:08/08/21.  I, Mosie Lukes, MD personally performed the services described in this documentation. All medical record entries made by the scribe were at my direction and in my presence. I have reviewed the chart and agree that the record reflects my personal performance and is accurate and complete

## 2021-08-07 NOTE — Patient Instructions (Addendum)
Start the Celebrex/Celecoxib 100 mg daily but can increase to twice daily if no stomach upset and inadequate pain control  Shingrix is the new shingles shot, 2 shots over 2-6 months, confirm coverage with insurance and document, then can return here for shots with nurse appt or at pharmacy   New covid shot at [facility] M-F 9-3  Exercises for Chronic Knee Pain Chronic knee pain is pain that lasts longer than 3 months. For most people with chronic knee pain, exercise and weight loss is an important part of treatment. Your health care provider may want you to focus on: Strengthening the muscles that support your knee. This can take pressure off your knee and lessen pain. Preventing knee stiffness. Maintaining or increasing how far you can move your knee. Losing weight (if this applies) to take pressure off your knee, decrease your risk for injury, and make it easier for you to exercise. Your health care provider will help you develop an exercise program that matches your needs and physical abilities. Below are simple, low-impact exercises you can do at home. Ask your health care provider or a physical therapist how often you should do your exercise program and how many times to repeat each exercise. General safety tips Follow these safety tips for exercising with chronic knee pain: Get your health care provider's approval before doing any exercises. Start slowly and stop any time an exercise causes pain. Do not exercise if your knee pain is flaring up. Warm up first. Stretching a cold muscle can cause an injury. Do 5-10 minutes of easy movement or light stretching before beginning your exercise routine. Do 5-10 minutes of low-impact activity (like walking or cycling) before starting strengthening exercises. Contact your health care provider any time you have pain during or after exercising. Exercise may cause discomfort but should not be painful. It is normal to be a little  stiff or sore after exercising.  Stretching and range-of-motion exercises Front thigh stretch  Stand up straight and support your body by holding on to a chair or resting one hand on a wall. With your legs straight and close together, bend one knee to lift your heel up toward your buttocks. Using one hand for support, grab your ankle with your free hand. Pull your foot up closer toward your buttocks to feel the stretch in front of your thigh. Hold the stretch for 30 seconds. Repeat __________ times. Complete this exercise __________ times a day. Back thigh stretch  Sit on the floor with your back straight and your legs out straight in front of you. Place the palms of your hands on the floor and slide them toward your feet as you bend at the hip. Try to touch your nose to your knees and feel the stretch in the back of your thighs. Hold for 30 seconds. Repeat __________ times. Complete this exercise __________ times a day. Calf stretch  Stand facing a wall. Place the palms of your hands flat against the wall, arms extended, and lean slightly against the wall. Get into a lunge position with one leg bent at the knee and the other leg stretched out straight behind you. Keep both feet facing the wall and increase the bend in your knee while keeping the heel of the other leg flat on the ground. You should feel the stretch in your calf. Hold for 30 seconds. Repeat __________ times. Complete this exercise __________ times a day. Strengthening exercises Straight leg lift Lie on your back with  one knee bent and the other leg out straight. Slowly lift the straight leg without bending the knee. Lift until your foot is about 12 inches (30 cm) off the floor. Hold for 3-5 seconds and slowly lower your leg. Repeat __________ times. Complete this exercise __________ times a day. Single leg dip Stand between two chairs and put both hands on the backs of the chairs for support. Extend one leg out  straight with your body weight resting on the heel of the standing leg. Slowly bend your standing knee to dip your body to the level that is comfortable for you. Hold for 3-5 seconds. Repeat __________ times. Complete this exercise __________ times a day. Hamstring curls Stand straight, knees close together, facing the back of a chair. Hold on to the back of a chair with both hands. Keep one leg straight. Bend the other knee while bringing the heel up toward the buttock until the knee is bent at a 90-degree angle (right angle). Hold for 3-5 seconds. Repeat __________ times. Complete this exercise __________ times a day. Wall squat Stand straight with your back, hips, and head against a wall. Step forward one foot at a time with your back still against the wall. Your feet should be 2 feet (61 cm) from the wall at shoulder width. Keeping your back, hips, and head against the wall, slide down the wall to as close of a sitting position as you can get. Hold for 5-10 seconds, then slowly slide back up. Repeat __________ times. Complete this exercise __________ times a day. Step-ups Step up with one foot onto a sturdy platform or stool that is about 6 inches (15 cm) high. Face sideways with one foot on the platform and one on the ground. Place all your weight on the platform foot and lift your body off the ground until your knee extends. Let your other leg hang free to the side. Hold for 3-5 seconds then slowly lower your weight down to the floor foot. Repeat __________ times. Complete this exercise __________ times a day. Contact a health care provider if: Your exercise causes pain. Your pain is worse after you exercise. Your pain prevents you from doing your exercises. This information is not intended to replace advice given to you by your health care provider. Make sure you discuss any questions you have with your health care provider. Document Revised: 02/29/2020 Document Reviewed:  10/23/2019 Elsevier Patient Education  2022 [employer].

## 2021-08-08 ENCOUNTER — Inpatient Hospital Stay (HOSPITAL_BASED_OUTPATIENT_CLINIC_OR_DEPARTMENT_OTHER): Payer: Medicare Other | Admitting: Hematology & Oncology

## 2021-08-08 ENCOUNTER — Encounter: Payer: Self-pay | Admitting: Hematology & Oncology

## 2021-08-08 ENCOUNTER — Inpatient Hospital Stay: Payer: Medicare Other | Attending: Hematology & Oncology

## 2021-08-08 VITALS — BP 173/97 | HR 89 | Temp 97.8°F | Resp 18 | Wt 200.0 lb

## 2021-08-08 DIAGNOSIS — M25562 Pain in left knee: Secondary | ICD-10-CM | POA: Insufficient documentation

## 2021-08-08 DIAGNOSIS — C9000 Multiple myeloma not having achieved remission: Secondary | ICD-10-CM | POA: Diagnosis not present

## 2021-08-08 DIAGNOSIS — M25561 Pain in right knee: Secondary | ICD-10-CM | POA: Insufficient documentation

## 2021-08-08 LAB — CMP (CANCER CENTER ONLY)
ALT: 23 U/L (ref 0–44)
AST: 62 U/L — ABNORMAL HIGH (ref 15–41)
Albumin: 3.8 g/dL (ref 3.5–5.0)
Alkaline Phosphatase: 51 U/L (ref 38–126)
Anion gap: 8 (ref 5–15)
BUN: 16 mg/dL (ref 8–23)
CO2: 27 mmol/L (ref 22–32)
Calcium: 9.2 mg/dL (ref 8.9–10.3)
Chloride: 105 mmol/L (ref 98–111)
Creatinine: 0.63 mg/dL (ref 0.44–1.00)
GFR, Estimated: >60 mL/min (ref >60)
Glucose, Bld: 100 mg/dL — ABNORMAL HIGH (ref 70–99)
Potassium: 3.6 mmol/L (ref 3.5–5.1)
Sodium: 140 mmol/L (ref 135–145)
Total Bilirubin: 0.4 mg/dL (ref 0.3–1.2)
Total Protein: 8.1 g/dL (ref 6.5–8.1)

## 2021-08-08 LAB — CBC WITH DIFFERENTIAL (CANCER CENTER ONLY)
Abs Immature Granulocytes: 0.01 10*3/uL (ref 0.00–0.07)
Basophils Absolute: 0 10*3/uL (ref 0.0–0.1)
Basophils Relative: 1 %
Eosinophils Absolute: 0.1 10*3/uL (ref 0.0–0.5)
Eosinophils Relative: 2 %
HCT: 36 % (ref 36.0–46.0)
Hemoglobin: 11.9 g/dL — ABNORMAL LOW (ref 12.0–15.0)
Immature Granulocytes: 0 %
Lymphocytes Relative: 22 %
Lymphs Abs: 0.8 10*3/uL (ref 0.7–4.0)
MCH: 29.2 pg (ref 26.0–34.0)
MCHC: 33.1 g/dL (ref 30.0–36.0)
MCV: 88.2 fL (ref 80.0–100.0)
Monocytes Absolute: 0.3 10*3/uL (ref 0.1–1.0)
Monocytes Relative: 10 %
Neutro Abs: 2.2 10*3/uL (ref 1.7–7.7)
Neutrophils Relative %: 65 %
Platelet Count: 114 10*3/uL — ABNORMAL LOW (ref 150–400)
RBC: 4.08 MIL/uL (ref 3.87–5.11)
RDW: 13 % (ref 11.5–15.5)
WBC Count: 3.4 10*3/uL — ABNORMAL LOW (ref 4.0–10.5)
nRBC: 0 % (ref 0.0–0.2)

## 2021-08-08 LAB — LACTATE DEHYDROGENASE: LDH: 275 U/L — ABNORMAL HIGH (ref 98–192)

## 2021-08-08 NOTE — Assessment & Plan Note (Signed)
Supplement and monitor 

## 2021-08-08 NOTE — Assessment & Plan Note (Signed)
hgba1c acceptable, minimize simple carbs. Increase exercise as tolerated.  

## 2021-08-08 NOTE — Progress Notes (Signed)
Hematology and Oncology Follow Up Visit  Tami [name] [id number] 1956-06-12 65 y.o. 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  She actually is doing pretty well.  Thankfully, she did not decide to go down to [place] this weekend.  If she is going to have a new pacemaker placed on October 21.  Hopefully this will last quite a while.  Her myeloma is slowly progressing.  However, she is totally asymptomatic right now.  Her blood counts certainly look good.  When we last saw her, her monoclonal spike was 1.6 g/dL.  The IgG level was 2100 mg/dL.  The Kappa light chain was 5.8 mg/dL.  She has had no problems with diarrhea.  Has been no abdominal pain.  She has had no bleeding.  There is been no fever.  Currently, I would have to say her performance status is probably ECOG 1.     Medications:  Allergies as of 08/08/2021       Reactions   Benazepril Anaphylaxis, Swelling, Hives   angioedema Throat and lip swelling   Ondansetron Hcl Hives   Redness and hives post IV admin on 07/05/17   Codeine Nausea And Vomiting   Morphine Hives   Redness and hives noted post IV admin on 07/05/17        Medication List        Accurate as of August 08, 2021  8:42 AM. If you have any questions, ask your nurse or doctor.          acetaminophen 500 MG tablet Commonly known as: TYLENOL Take 500 mg by mouth every 6 (six) hours as needed.   acyclovir 400 MG tablet Commonly known as: ZOVIRAX Take 400 mg by mouth 2 (two) times daily.   albuterol 108 (90 Base) MCG/ACT  inhaler Commonly known as: VENTOLIN HFA Inhale 2 puffs into the lungs every 6 (six) hours as needed for wheezing or shortness of breath.   aspirin EC 81 MG tablet Take 1 tablet (81 mg total) by mouth daily.   budesonide-formoterol 160-4.5 MCG/ACT inhaler Commonly known as: SYMBICORT Inhale 2 puffs into the lungs 2 (two) times daily.   carvedilol 25 MG tablet Commonly known as: COREG Take 1 tablet (25 mg total) by mouth 2 (two) times daily with a meal.   celecoxib 100 MG capsule Commonly known as: CeleBREX Take 1 capsule (100 mg total) by mouth 2 (two) times daily as needed for mild pain or moderate pain (with food).   cholestyramine 4 g packet Commonly known as: QUESTRAN Take 1 packet by mouth 2 (two) times daily.   clotrimazole 2 % vaginal cream Commonly known as: GYNE-LOTRIMIN 3 Place 1 Applicatorful vaginally at bedtime.   cyclobenzaprine 10 MG tablet Commonly known as: FLEXERIL Take 1 tablet (10 mg total) by mouth at bedtime.   dicyclomine 10 MG capsule Commonly known as: BENTYL Take 1 tab every 6 hours as needed for abdominal cramping.   diphenhydrAMINE 25 mg capsule Commonly known as: BENADRYL TAKE ONE (1) CAPSULE BY MOUTH THREE (3) TIMES EACH  DAY AS NEEDED   fluticasone 50 MCG/ACT nasal spray Commonly known as: FLONASE Place 2 sprays into both nostrils daily.   furosemide 20 MG tablet Commonly known as: LASIX Take 1 tablet by mouth as directed.   hydrOXYzine 25 MG tablet Commonly known as: ATARAX/VISTARIL Take 25 mg by mouth 3 (three) times daily.   losartan 25 MG tablet Commonly known as: COZAAR Take 25 mg by mouth.   losartan 50 MG tablet Commonly known as: COZAAR Take 75 mg by mouth daily.   magnesium oxide 400 MG tablet Commonly known as: MAG-OX Take 1 tablet (400 mg total) by mouth 2 (two) times daily.   meclizine 12.5 MG tablet Commonly known as: ANTIVERT Take 12.5 mg by mouth 3 (three) times daily.   montelukast 10 MG tablet Commonly  known as: Singulair Take 1 tablet (10 mg total) by mouth at bedtime.   MULTIVITAMIN ADULT PO Take 1 tablet by mouth daily.   omeprazole 40 MG capsule Commonly known as: PRILOSEC Take 1 capsule (40 mg total) by mouth in the morning and at bedtime.   promethazine 25 MG tablet Commonly known as: PHENERGAN Take 1 tablet (25 mg total) by mouth every 6 (six) hours as needed for nausea or vomiting.   promethazine-dextromethorphan 6.25-15 MG/5ML syrup Commonly known as: PROMETHAZINE-DM Take 5 mLs by mouth 3 (three) times daily as needed for cough.   sucralfate 1 g tablet Commonly known as: Carafate Take 1 tablet (1 g total) by mouth 3 (three) times daily as needed. prn   topiramate 50 MG tablet Commonly known as: Topamax Take 1 tablet (50 mg total) by mouth 2 (two) times daily.   triamcinolone cream 0.1 % Commonly known as: KENALOG Apply topically 2 (two) times daily.   Vitamin D-3 25 MCG (1000 UT) Caps Take 1 capsule (1,000 Units total) by mouth daily.   zinc sulfate 220 (50 Zn) MG capsule Take 220 mg by mouth 2 (two) times daily.   zolpidem 5 MG tablet Commonly known as: AMBIEN Take 1 tablet (5 mg total) by mouth at bedtime as needed for sleep.        Allergies:  Allergies  Allergen Reactions   Benazepril Anaphylaxis, Swelling and Hives    angioedema Throat and lip swelling   Ondansetron Hcl Hives    Redness and hives post IV admin on 07/05/17   Codeine Nausea And Vomiting   Morphine Hives    Redness and hives noted post IV admin on 07/05/17    Past Medical History, Surgical history, Social history, and Family History were reviewed and updated.  Review of Systems: Review of Systems  Constitutional: Negative.   HENT: Negative.    Eyes: Negative.   Respiratory: Negative.    Cardiovascular: Negative.   Gastrointestinal: Negative.   Genitourinary: Negative.   Musculoskeletal: Negative.   Skin: Negative.   Neurological: Negative.   Endo/Heme/Allergies:  Negative.   Psychiatric/Behavioral: Negative.       Physical Exam:  weight is 200 lb (90.7 kg). Her oral temperature is 97.8 F (36.6 C). Her blood pressure is 173/97 (abnormal) and her pulse is 89. Her respiration is 18 and oxygen saturation is 97%.   Wt Readings from Last 3 Encounters:  08/08/21 200 lb (90.7 kg)  08/07/21 202 lb 9.6 oz (91.9 kg)  07/25/21 197 lb 6 oz (89.5 kg)    Physical Exam Vitals reviewed.  HENT:     Head: Normocephalic and atraumatic.  Eyes:     Pupils: Pupils are equal,  round, and reactive to light.  Cardiovascular:     Rate and Rhythm: Normal rate and regular rhythm.     Heart sounds: Normal heart sounds.  Pulmonary:     Effort: Pulmonary effort is normal.     Breath sounds: Normal breath sounds.  Abdominal:     General: Bowel sounds are normal.     Palpations: Abdomen is soft.  Musculoskeletal:        General: No tenderness or deformity. Normal range of motion.     Cervical back: Normal range of motion.  Lymphadenopathy:     Cervical: No cervical adenopathy.  Skin:    General: Skin is warm and dry.     Findings: No erythema or rash.  Neurological:     Mental Status: She is alert and oriented to person, place, and time.  Psychiatric:        Behavior: Behavior normal.        Thought Content: Thought content normal.        Judgment: Judgment normal.     Lab Results  Component Value Date   WBC 3.4 (L) 08/08/2021   HGB 11.9 (L) 08/08/2021   HCT 36.0 08/08/2021   MCV 88.2 08/08/2021   PLT 114 (L) 08/08/2021   Lab Results  Component Value Date   FERRITIN 2,483 (H) 08/26/2020   IRON 124 08/26/2020   TIBC 284 08/26/2020   UIBC 160 08/26/2020   IRONPCTSAT 44 08/26/2020   Lab Results  Component Value Date   RETICCTPCT 0.8 07/16/2016   RBC 4.08 08/08/2021   RETICCTABS 32,640 07/16/2016   Lab Results  Component Value Date   KPAFRELGTCHN 57.8 (H) 07/03/2021   LAMBDASER 6.4 07/03/2021   KAPLAMBRATIO 9.03 (H) 07/03/2021   Lab  Results  Component Value Date   IGGSERUM 2,349 (H) 07/03/2021   IGGSERUM 2,225 (H) 07/03/2021   IGA 98 07/03/2021   IGA 91 07/03/2021   IGMSERUM 79 07/03/2021   IGMSERUM 76 07/03/2021   Lab Results  Component Value Date   TOTALPROTELP 7.7 07/03/2021   ALBUMINELP 3.3 07/03/2021   A1GS 0.2 07/03/2021   A2GS 1.0 07/03/2021   BETS 1.2 07/03/2021   BETA2SER 0.5 07/16/2016   GAMS 1.9 (H) 07/03/2021   MSPIKE 1.6 (H) 07/03/2021   SPEI Comment 12/19/2020     Chemistry      Component Value Date/Time   NA 140 08/08/2021 0747   NA 143 11/08/2017 1127   NA 140 12/31/2016 1000   K 3.6 08/08/2021 0747   K 4.0 11/08/2017 1127   K 3.6 12/31/2016 1000   CL 105 08/08/2021 0747   CL 107 11/08/2017 1127   CO2 27 08/08/2021 0747   CO2 25 11/08/2017 1127   CO2 24 12/31/2016 1000   BUN 16 08/08/2021 0747   BUN 19 11/08/2017 1127   BUN 22.3 12/31/2016 1000   CREATININE 0.63 08/08/2021 0747   CREATININE 0.9 11/08/2017 1127   CREATININE 0.8 12/31/2016 1000      Component Value Date/Time   CALCIUM 9.2 08/08/2021 0747   CALCIUM 9.9 11/08/2017 1127   CALCIUM 9.5 12/31/2016 1000   ALKPHOS 51 08/08/2021 0747   ALKPHOS 44 11/08/2017 1127   ALKPHOS 51 12/31/2016 1000   AST 62 (H) 08/08/2021 0747   AST 28 12/31/2016 1000   ALT 23 08/08/2021 0747   ALT 36 11/08/2017 1127   ALT 16 12/31/2016 1000   BILITOT 0.4 08/08/2021 0747   BILITOT 0.46 12/31/2016 1000  Impression and Plan: Ms. Yinger is a very pleasant 65 yo [place] American female with a previous IgG kappa MGUS with progression to myeloma.   So far, she been off treatment now for about 9 months.  Will be interesting to see what her monoclonal studies look like right now.  I think if we do have to restart therapy, I would probably consider elotuzimab with Kyprolis.  I think this would be a reasonable combination to try.  She is going to have the pacemaker on October 21.  I will plan to see her back in November.  I would really  like to try to get her through all of the holidays without having to treat her.     [name], MD 9/30/20228:42 AM

## 2021-08-08 NOTE — Assessment & Plan Note (Signed)
Well controlled, no changes to meds. Encouraged heart healthy diet such as the DASH diet and exercise as tolerated.  °

## 2021-08-08 NOTE — Assessment & Plan Note (Signed)
She is scheduled for replacement of pacer/defibrillator next month

## 2021-08-08 NOTE — Assessment & Plan Note (Signed)
Avoid offending foods, start probiotics. Do not eat large meals in late evening and consider raising head of bed. Improved symptoms with switch from Pantoprazole to Omeprazole bid.

## 2021-08-08 NOTE — Assessment & Plan Note (Signed)
Encourage heart healthy diet such as MIND or DASH diet, increase exercise, avoid trans fats, simple carbohydrates and processed foods, consider a krill or fish or flaxseed oil cap daily.  °

## 2021-08-08 NOTE — Assessment & Plan Note (Signed)
Continues to follow closely with oncology and is awaiting further work up now that she is off of treatments to assess for stability

## 2021-08-08 NOTE — Assessment & Plan Note (Signed)
Is following with ortho Dr Linton Rump in [facility] and they recently drained fluid and administered some steroid injections but pain persists she was given some Meloxicam but did not start it yet. With her GI symptoms will try low dose Celebrex 100 mg daily instead take with food and alternate with Tylenol

## 2021-08-10 LAB — IGG, IGA, IGM
IgA: 104 mg/dL (ref 87–352)
IgG (Immunoglobin G), Serum: 2379 mg/dL — ABNORMAL HIGH (ref 586–1602)
IgM (Immunoglobulin M), Srm: 86 mg/dL (ref 26–217)

## 2021-08-11 LAB — KAPPA/LAMBDA LIGHT CHAINS
Kappa free light chain: 43 mg/L — ABNORMAL HIGH (ref 3.3–19.4)
Kappa, lambda light chain ratio: 7.29 — ABNORMAL HIGH (ref 0.26–1.65)
Lambda free light chains: 5.9 mg/L (ref 5.7–26.3)

## 2021-08-12 ENCOUNTER — Ambulatory Visit (INDEPENDENT_AMBULATORY_CARE_PROVIDER_SITE_OTHER): Payer: Medicare Other | Admitting: Family

## 2021-08-12 ENCOUNTER — Other Ambulatory Visit: Payer: Self-pay

## 2021-08-12 ENCOUNTER — Telehealth: Payer: Self-pay | Admitting: *Deleted

## 2021-08-12 ENCOUNTER — Ambulatory Visit: Payer: Medicare Other | Admitting: Family Medicine

## 2021-08-12 VITALS — BP 120/84 | HR 93 | Temp 97.8°F | Resp 18 | Ht 66.0 in | Wt 199.4 lb

## 2021-08-12 DIAGNOSIS — N3090 Cystitis, unspecified without hematuria: Secondary | ICD-10-CM | POA: Diagnosis not present

## 2021-08-12 DIAGNOSIS — H938X3 Other specified disorders of ear, bilateral: Secondary | ICD-10-CM | POA: Insufficient documentation

## 2021-08-12 LAB — POC URINALSYSI DIPSTICK (AUTOMATED)
Bilirubin, UA: NEGATIVE
Blood, UA: NEGATIVE
Glucose, UA: NEGATIVE
Ketones, UA: NEGATIVE
Nitrite, UA: NEGATIVE
Protein, UA: NEGATIVE
Spec Grav, UA: 1.025 (ref 1.010–1.025)
Urobilinogen, UA: 0.2 E.U./dL
pH, UA: 6 (ref 5.0–8.0)

## 2021-08-12 MED ORDER — CEPHALEXIN 500 MG PO CAPS: 500.0000 mg | ORAL_CAPSULE | Freq: Four times a day (QID) | ORAL | 0 refills | Status: DC

## 2021-08-12 NOTE — Progress Notes (Signed)
Acute Office Visit  Subjective:    [name], female    DOB: 08-18-1956, 65 y.o.   MRN: [id number]  Chief Complaint  Patient presents with   possible UTI    Started this weekend. Chills, unsteady gait. Urine doesn't look right   Ear Fullness    Started this weekend as well.    Ear Fullness   Patient is in today for urinary symptoms and ear fullness, denies pain.  Past Medical History:  Diagnosis Date   Abnormal SPEP 07/26/2016   Arthritis    knees, hands   Back pain 07/14/2016   Bowel obstruction ([place]) 11/2014   C. 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     acyclovir (ZOVIRAX) 400 MG tablet Take 400 mg by mouth 2 (two) times daily.     albuterol (VENTOLIN HFA) 108 (90 Base) MCG/ACT inhaler Inhale 2 puffs into the lungs every 6 (six) hours as needed for wheezing or shortness of breath. 18 g 5   aspirin EC 81 MG tablet Take 1 tablet (81 mg total) by mouth daily. 90 tablet 1   budesonide-formoterol (SYMBICORT) 160-4.5 MCG/ACT inhaler Inhale 2 puffs into the lungs 2 (two) times daily. 1 each 3   carvedilol (COREG) 25 MG tablet Take 1 tablet (25 mg total) by  mouth 2 (two) times daily with a meal. 180 tablet 1   celecoxib (CELEBREX) 100 MG capsule Take 1 capsule (100 mg total) by mouth 2 (two) times daily as needed for mild pain or moderate pain (with food). 60 capsule 3   Cholecalciferol (VITAMIN D-3) 25 MCG (1000 UT) CAPS Take 1 capsule (1,000 Units total) by mouth daily. 60 capsule 2   cholestyramine (QUESTRAN) 4 g packet Take 1 packet by mouth 2 (two) times daily.     clotrimazole (GYNE-LOTRIMIN 3) 2 % vaginal cream Place 1 Applicatorful vaginally at bedtime. 21 g 0   cyclobenzaprine (FLEXERIL) 10 MG tablet Take 1 tablet (10 mg total) by mouth at bedtime. 7 tablet 0   dicyclomine (BENTYL) 10 MG capsule Take 1 tab every 6 hours as needed for abdominal cramping. 60 capsule 0   diphenhydrAMINE (BENADRYL) 25 mg capsule TAKE ONE (1) CAPSULE BY MOUTH THREE (3) TIMES EACH DAY AS NEEDED     fluticasone (FLONASE) 50 MCG/ACT nasal spray Place 2 sprays into both nostrils daily. 16 g 0   furosemide (LASIX) 20 MG tablet Take 1 tablet by mouth as directed.     hydrOXYzine (ATARAX/VISTARIL) 25 MG tablet Take 25 mg by mouth 3 (three) times daily.  losartan (COZAAR) 25 MG tablet Take 25 mg by mouth.     losartan (COZAAR) 50 MG tablet Take 75 mg by mouth daily.     magnesium oxide (MAG-OX) 400 MG tablet Take 1 tablet (400 mg total) by mouth 2 (two) times daily. 60 tablet 1   meclizine (ANTIVERT) 12.5 MG tablet Take 12.5 mg by mouth 3 (three) times daily.     montelukast (SINGULAIR) 10 MG tablet Take 1 tablet (10 mg total) by mouth at bedtime. 30 tablet 3   Multiple Vitamins-Minerals (MULTIVITAMIN ADULT PO) Take 1 tablet by mouth daily.     omeprazole (PRILOSEC) 40 MG capsule Take 1 capsule (40 mg total) by mouth in the morning and at bedtime. 60 capsule 2   promethazine (PHENERGAN) 25 MG tablet Take 1 tablet (25 mg total) by mouth every 6 (six) hours as needed for nausea or vomiting. 30 tablet 0   promethazine-dextromethorphan (PROMETHAZINE-DM) 6.25-15 MG/5ML  syrup Take 5 mLs by mouth 3 (three) times daily as needed for cough. 240 mL 1   sucralfate (CARAFATE) 1 g tablet Take 1 tablet (1 g total) by mouth 3 (three) times daily as needed. prn 90 tablet 1   topiramate (TOPAMAX) 50 MG tablet Take 1 tablet (50 mg total) by mouth 2 (two) times daily. 60 tablet 3   triamcinolone cream (KENALOG) 0.1 % Apply topically 2 (two) times daily.     zinc sulfate 220 (50 Zn) MG capsule Take 220 mg by mouth 2 (two) times daily.     zolpidem (AMBIEN) 5 MG tablet Take 1 tablet (5 mg total) by mouth at bedtime as needed for sleep. 15 tablet 1   No facility-administered medications prior to visit.    Allergies  Allergen Reactions   Benazepril Anaphylaxis, Swelling and Hives    angioedema Throat and lip swelling   Ondansetron Hcl Hives    Redness and hives post IV admin on 07/05/17   Codeine Nausea And Vomiting   Morphine Hives    Redness and hives noted post IV admin on 07/05/17    Review of Systems See HPI above.      Objective:    Physical Exam Vitals and nursing note reviewed.  Constitutional:      Appearance: Normal appearance.  HENT:     Right Ear: Tympanic membrane and external ear normal.     Left Ear: Tympanic membrane and external ear normal.  Cardiovascular:     Rate and Rhythm: Normal rate and regular rhythm.  Pulmonary:     Effort: Pulmonary effort is normal.     Breath sounds: Normal breath sounds.  Musculoskeletal:        General: Normal range of motion.  Psychiatric:        Mood and Affect: Mood normal.    BP 120/84 (BP Location: Left Arm, Patient Position: Sitting, Cuff Size: Large)   Pulse 93   Temp 97.8 F (36.6 C) (Oral)   Resp 18   Ht $R'5\' 6"'nF$  (1.676 m)   Wt 199 lb 6.4 oz (90.4 kg)   SpO2 97%   BMI 32.18 kg/m  Wt Readings from Last 3 Encounters:  08/12/21 199 lb 6.4 oz (90.4 kg)  08/08/21 200 lb (90.7 kg)  08/07/21 202 lb 9.6 oz (91.9 kg)    Health Maintenance Due  Topic Date Due   Zoster Vaccines- Shingrix (1  of 2) Never done    There are no preventive care reminders to display for this patient.  Assessment & Plan:   Problem List Items Addressed This Visit       Nervous and Auditory   Ear fullness, bilateral    Ears clear bilaterally, no edema or erythema. Pt feels mostly related to using her CPAP machine and straps pressing against her ears, advised she contact the company who supplies her machine to see about adjusting her fit.         Genitourinary   Cystitis - Primary    UA with mild Leuks. Will also send for culture. Tolerated keflex in past. Advised to call office if worsening sx.      Relevant Medications   cephALEXin (KEFLEX) 500 MG capsule   Other Relevant Orders   POCT Urinalysis Dipstick (Automated) (Completed)   Urine Culture     Meds ordered this encounter  Medications   cephALEXin (KEFLEX) 500 MG capsule    Sig: Take 1 capsule (500 mg total) by mouth 4 (four) times daily for 5 days.    Dispense:  20 capsule    Refill:  0    Order Specific Question:   Supervising Provider    Answer:   ANDY, CAMILLE L [[id number]]      Jeanie Sewer, NP

## 2021-08-12 NOTE — Assessment & Plan Note (Addendum)
UA with mild Leuks. Will also send for culture. Tolerated keflex in past. Advised to call office if worsening sx.

## 2021-08-12 NOTE — Patient Instructions (Addendum)
I have sent the antibiotic Keflex to your pharmacy for your bladder infection. We will call you with the culture results in a few days. Increase your water intake to at least 2 liters daily. As discussed, contact the CPAP company about possibly adjusting your strap fitting. Call office back if symptoms are not resolved.

## 2021-08-12 NOTE — Telephone Encounter (Signed)
Left message for patient to call back.  Found sooner appointment.  09/16/21 at 120pm.

## 2021-08-12 NOTE — Assessment & Plan Note (Signed)
Ears clear bilaterally, no edema or erythema. Pt feels mostly related to using her CPAP machine and straps pressing against her ears, advised she contact the company who supplies her machine to see about adjusting her fit.

## 2021-08-13 DIAGNOSIS — I5022 Chronic systolic (congestive) heart failure: Secondary | ICD-10-CM | POA: Diagnosis not present

## 2021-08-13 DIAGNOSIS — I447 Left bundle-branch block, unspecified: Secondary | ICD-10-CM | POA: Diagnosis not present

## 2021-08-13 DIAGNOSIS — Z950 Presence of cardiac pacemaker: Secondary | ICD-10-CM | POA: Diagnosis not present

## 2021-08-13 DIAGNOSIS — I499 Cardiac arrhythmia, unspecified: Secondary | ICD-10-CM | POA: Diagnosis not present

## 2021-08-13 NOTE — Telephone Encounter (Signed)
After Hours Call: Caller is returning a call from the office.  Telephone: [phone]

## 2021-08-13 NOTE — Telephone Encounter (Signed)
Left message on machine to call back  

## 2021-08-14 ENCOUNTER — Other Ambulatory Visit: Payer: Self-pay | Admitting: Family

## 2021-08-14 DIAGNOSIS — N3090 Cystitis, unspecified without hematuria: Secondary | ICD-10-CM

## 2021-08-14 LAB — URINE CULTURE
MICRO NUMBER:: 12457961
SPECIMEN QUALITY:: ADEQUATE

## 2021-08-14 LAB — PROTEIN ELECTROPHORESIS, SERUM, WITH REFLEX
A/G Ratio: 0.9 (ref 0.7–1.7)
Albumin ELP: 3.5 g/dL (ref 2.9–4.4)
Alpha-1-Globulin: 0.2 g/dL (ref 0.0–0.4)
Alpha-2-Globulin: 0.9 g/dL (ref 0.4–1.0)
Beta Globulin: 1 g/dL (ref 0.7–1.3)
Gamma Globulin: 2.1 g/dL — ABNORMAL HIGH (ref 0.4–1.8)
Globulin, Total: 4.1 g/dL — ABNORMAL HIGH (ref 2.2–3.9)
M-Spike, %: 1.5 g/dL — ABNORMAL HIGH
SPEP Interpretation: 0
Total Protein ELP: 7.6 g/dL (ref 6.0–8.5)

## 2021-08-14 LAB — IMMUNOFIXATION REFLEX, SERUM
IgA: 122 mg/dL (ref 87–352)
IgG (Immunoglobin G), Serum: 2550 mg/dL — ABNORMAL HIGH (ref 586–1602)
IgM (Immunoglobulin M), Srm: 92 mg/dL (ref 26–217)

## 2021-08-14 MED ORDER — NITROFURANTOIN MONOHYD MACRO 100 MG PO CAPS: 100.0000 mg | ORAL_CAPSULE | Freq: Two times a day (BID) | ORAL | Status: AC

## 2021-08-14 NOTE — Telephone Encounter (Signed)
Patient called back and she was able to switch her appointment to Nov 8th, at 1:20pm.

## 2021-08-14 NOTE — Progress Notes (Signed)
Please call pt and ask how she is doing with her urinary symptoms. Did she start the Keflex? Her urine culture results indicate resistance to this abt. So I will send Macrobid instead and she should stop the Keflex. Thx

## 2021-08-25 ENCOUNTER — Emergency Department (HOSPITAL_BASED_OUTPATIENT_CLINIC_OR_DEPARTMENT_OTHER): Payer: Medicare Other

## 2021-08-25 ENCOUNTER — Emergency Department (HOSPITAL_BASED_OUTPATIENT_CLINIC_OR_DEPARTMENT_OTHER)
Admission: EM | Admit: 2021-08-25 | Discharge: 2021-08-25 | Disposition: A | Discharge status: Home / Self Care | Payer: Medicare Other | Attending: Emergency Medicine | Admitting: Emergency Medicine

## 2021-08-25 ENCOUNTER — Encounter (HOSPITAL_BASED_OUTPATIENT_CLINIC_OR_DEPARTMENT_OTHER): Payer: Self-pay

## 2021-08-25 ENCOUNTER — Other Ambulatory Visit: Payer: Self-pay

## 2021-08-25 DIAGNOSIS — Y93G3 Activity, cooking and baking: Secondary | ICD-10-CM | POA: Insufficient documentation

## 2021-08-25 DIAGNOSIS — Z79899 Other long term (current) drug therapy: Secondary | ICD-10-CM | POA: Insufficient documentation

## 2021-08-25 DIAGNOSIS — W228XXA Striking against or struck by other objects, initial encounter: Secondary | ICD-10-CM | POA: Diagnosis not present

## 2021-08-25 DIAGNOSIS — Z950 Presence of cardiac pacemaker: Secondary | ICD-10-CM | POA: Diagnosis not present

## 2021-08-25 DIAGNOSIS — M7989 Other specified soft tissue disorders: Secondary | ICD-10-CM | POA: Diagnosis not present

## 2021-08-25 DIAGNOSIS — M79675 Pain in left toe(s): Secondary | ICD-10-CM | POA: Diagnosis not present

## 2021-08-25 DIAGNOSIS — S90932A Unspecified superficial injury of left great toe, initial encounter: Secondary | ICD-10-CM | POA: Diagnosis not present

## 2021-08-25 DIAGNOSIS — Z7982 Long term (current) use of aspirin: Secondary | ICD-10-CM | POA: Insufficient documentation

## 2021-08-25 DIAGNOSIS — S90935D Unspecified superficial injury of left lesser toe(s), subsequent encounter: Secondary | ICD-10-CM | POA: Diagnosis not present

## 2021-08-25 DIAGNOSIS — S99922A Unspecified injury of left foot, initial encounter: Secondary | ICD-10-CM | POA: Diagnosis not present

## 2021-08-25 DIAGNOSIS — I5041 Acute combined systolic (congestive) and diastolic (congestive) heart failure: Secondary | ICD-10-CM | POA: Insufficient documentation

## 2021-08-25 DIAGNOSIS — R600 Localized edema: Secondary | ICD-10-CM | POA: Diagnosis not present

## 2021-08-25 DIAGNOSIS — I110 Hypertensive heart disease with heart failure: Secondary | ICD-10-CM | POA: Diagnosis not present

## 2021-08-25 MED ORDER — IBUPROFEN 800 MG PO TABS: 800.0000 mg | ORAL_TABLET | Freq: Three times a day (TID) | ORAL | 0 refills | Status: DC

## 2021-08-25 MED ORDER — HYDROCODONE-ACETAMINOPHEN 5-325 MG PO TABS: 1.0000 | ORAL_TABLET | Freq: Four times a day (QID) | ORAL | 0 refills | Status: DC | PRN

## 2021-08-25 NOTE — ED Provider Notes (Signed)
[facility] Provider Note   CSN: [id number] Arrival date & time: 08/25/21  0920     History Chief Complaint  Patient presents with   Foot Injury    Tami [name] is a 65 y.o. female with a past medical history as noted below who presents to the ED due to left foot pain x4 days.  Patient states she hit her left toes on a couch while cooking on Thursday.  Patient states pain is worse with movement and ambulation. Pain located throughout 3-5th metatarsals. Pain associated with edema to third through fifth toes.  She has been taking Tylenol with moderate relief. She also endorses some numbness/tingling. No other injuries.   History obtained from patient and past medical records. No interpreter used during encounter.       Past Medical History:  Diagnosis Date   Abnormal SPEP 07/26/2016   Arthritis    knees, hands   Back pain 07/14/2016   Bowel obstruction ([place]) 11/2014   C. 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   Multiple myeloma ([place]) 07/20/2018   Multiple myeloma not having achieved remission ([place]) 07/20/2018   Myalgia 12/31/2016   Obstructive sleep apnea 03/31/2015   SOB (shortness of breath) 04/09/2016   Stroke ([place])    TIAs   UTI (urinary tract infection)     Patient Active Problem List   Diagnosis Date Noted   Ear fullness, bilateral 08/12/2021   Bilateral knee pain 08/08/2021   History of 2019 novel coronavirus disease (COVID-19) 12/06/2020   Low vitamin D level 07/31/2020   Acute combined systolic and diastolic heart failure ([place]) 05/09/2020   Low  magnesium level 03/04/2020   Thrombocytopenia ([place]) 03/04/2020   Dysphagia 03/04/2020   Constipation 03/04/2020   Sensation of pressure in bladder area 01/17/2020   Hyperglycemia 01/17/2020   Peripheral neuropathy 11/15/2019   Chronic migraine without aura, with intractable migraine, so stated, with status migrainosus 10/17/2019   ETD (Eustachian tube dysfunction), bilateral 09/12/2019   Urinary frequency 07/17/2019   Headache 07/17/2019   Palpitation 05/15/2019   Thyroid nodule 08/26/2018   Multiple myeloma ([place]) 07/20/2018   Multiple myeloma not having achieved remission ([place]) 07/20/2018   Hematuria 07/11/2018   Seasonal allergies 06/23/2018   Right shoulder pain 06/07/2018   Neck pain 06/07/2018   Paresthesias 06/07/2018   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   Epigastric pain 03/22/2017   Myalgia 12/31/2016   SOB (shortness of breath) 07/26/2016   Abnormal SPEP 07/26/2016   Low back pain 07/14/2016   Insomnia 12/29/2015   Tension headache 10/21/2015   Muscle spasms of neck 10/18/2015   Dysuria 10/13/2015   Sinusitis 07/07/2015   AP (abdominal pain) 07/07/2015   Cardiomyopathy ([place]) 04/19/2015   Chest wall  pain 04/19/2015   Obstructive sleep apnea 03/31/2015   Congestive heart failure ([place]) 02/24/2015   Arthritis    Hyperlipidemia    Hypertension    Depression    Stroke [facility])    GERD (gastroesophageal reflux disease)    Bowel obstruction ([place]) 11/09/2014    Past Surgical History:  Procedure Laterality Date   ABDOMINAL HYSTERECTOMY     menorraghia, 2006, total   CHOLECYSTECTOMY     COLONOSCOPY  2018   [facility] per patient    ESOPHAGOGASTRODUODENOSCOPY  2018   [facility]     right, repair torn torn cartialage   PACEMAKER INSERTION  08/2014   TONSILLECTOMY     TUBAL LIGATION       OB History   No obstetric history on file.     Family History  Problem Relation Age of Onset   Diabetes Mother    Hypertension Mother    Heart disease Mother        s/p 1 stent   Hyperlipidemia Mother    Arthritis Mother    Cancer Father        COLON   Colon cancer Father 66   Irritable bowel syndrome Sister    Hyperlipidemia Daughter    Hypertension Daughter    Hypertension Maternal Grandmother    Arthritis Maternal Grandmother    Heart disease Maternal Grandfather        MI   Hypertension Maternal Grandfather    Arthritis Maternal Grandfather    Hypertension Son    Esophageal cancer Neg Hx     Social History   Tobacco Use   Smoking status: Never   Smokeless tobacco: Never  Vaping Use   Vaping Use: Never used  Substance Use Topics   Alcohol use: No   Drug use: No    Home Medications Prior to Admission medications   Medication Sig Start Date End Date Taking? Authorizing Provider  acetaminophen (TYLENOL) 500 MG tablet Take 500 mg by mouth every 6 (six) hours as needed.    [provider]  acyclovir (ZOVIRAX) 400 MG tablet Take 400 mg by mouth 2 (two) times daily. 06/26/21   [provider]  albuterol (VENTOLIN HFA) 108 (90 Base) MCG/ACT inhaler Inhale 2 puffs into the lungs every 6 (six) hours as needed for wheezing or shortness of breath. 09/30/20   Mosie Lukes, MD  aspirin EC 81 MG tablet Take 1 tablet (81 mg total) by mouth daily. 02/15/18   Mosie Lukes, MD  budesonide-formoterol [facility]) 160-4.5 MCG/ACT inhaler Inhale 2 puffs into the lungs 2 (two) times daily. 10/01/20   Mosie Lukes, MD  carvedilol (COREG) 25 MG tablet Take 1 tablet (25 mg total) by mouth 2 (two) times daily with a meal. 06/04/20   Mosie Lukes, MD  celecoxib (CELEBREX) 100  MG capsule Take 1 capsule (100 mg total) by mouth 2 (two) times daily as needed for mild pain or moderate pain (with food). 08/07/21   Mosie Lukes, MD  Cholecalciferol (VITAMIN D-3) 25 MCG (1000 UT) CAPS Take 1 capsule (1,000 Units total) by mouth daily. 05/03/20   Mosie Lukes, MD  cholestyramine Lucrezia Starch) 4 g packet Take 1 packet by mouth 2 (two) times daily. 06/16/21   [provider]  clotrimazole (GYNE-LOTRIMIN 3) 2 % vaginal cream Place 1 Applicatorful vaginally at bedtime. 06/18/21   Colon Branch, MD  cyclobenzaprine (FLEXERIL) 10 MG tablet Take 1 tablet (10 mg total)  by mouth at bedtime. 02/19/20   Saguier, Percell [facility], PA-C  dicyclomine (BENTYL) 10 MG capsule Take 1 tab every 6 hours as needed for abdominal cramping. 05/28/21   Shelda Pal, DO  diphenhydrAMINE (BENADRYL) 25 mg capsule TAKE ONE (1) CAPSULE BY MOUTH THREE (3) TIMES EACH DAY AS NEEDED 03/11/21   [provider]  fluticasone (FLONASE) 50 MCG/ACT nasal spray Place 2 sprays into both nostrils daily. 01/07/20   Joy, Shawn C, PA-C  furosemide (LASIX) 20 MG tablet Take 1 tablet by mouth as directed. 07/03/20   [provider]  hydrOXYzine (ATARAX/VISTARIL) 25 MG tablet Take 25 mg by mouth 3 (three) times daily. 04/25/21   [provider]  losartan (COZAAR) 25 MG tablet Take 25 mg by mouth. 05/06/20   [provider]  losartan (COZAAR) 50 MG tablet Take 75 mg by mouth daily. 04/14/21   [provider]  magnesium oxide (MAG-OX) 400 MG tablet Take 1 tablet (400 mg total) by mouth 2 (two) times daily. 03/04/20   Mosie Lukes, MD  meclizine (ANTIVERT) 12.5 MG tablet Take 12.5 mg by mouth 3 (three) times daily. 02/18/20   [provider]  montelukast (SINGULAIR) 10 MG tablet Take 1 tablet (10 mg total) by mouth at bedtime. 02/14/21   Debbrah Alar, NP  Multiple Vitamins-Minerals (MULTIVITAMIN ADULT PO) Take 1 tablet by mouth daily.    [provider]  omeprazole  (PRILOSEC) 40 MG capsule Take 1 capsule (40 mg total) by mouth in the morning and at bedtime. 07/25/21   Shelda Pal, DO  promethazine (PHENERGAN) 25 MG tablet Take 1 tablet (25 mg total) by mouth every 6 (six) hours as needed for nausea or vomiting. 07/25/21   Shelda Pal, DO  promethazine-dextromethorphan (PROMETHAZINE-DM) 6.25-15 MG/5ML syrup Take 5 mLs by mouth 3 (three) times daily as needed for cough. 06/02/21   Mosie Lukes, MD  sucralfate (CARAFATE) 1 g tablet Take 1 tablet (1 g total) by mouth 3 (three) times daily as needed. prn 07/30/20   Mosie Lukes, MD  topiramate (TOPAMAX) 50 MG tablet Take 1 tablet (50 mg total) by mouth 2 (two) times daily. 07/13/19   Mosie Lukes, MD  triamcinolone cream (KENALOG) 0.1 % Apply topically 2 (two) times daily. 03/11/21   [provider]  zinc sulfate 220 (50 Zn) MG capsule Take 220 mg by mouth 2 (two) times daily. 06/16/21   [provider]  zolpidem (AMBIEN) 5 MG tablet Take 1 tablet (5 mg total) by mouth at bedtime as needed for sleep. 05/08/21   Mosie Lukes, MD  prochlorperazine (COMPAZINE) 10 MG tablet Take 1 tablet (10 mg total) by mouth every 6 (six) hours as needed (Nausea or vomiting). 09/04/20 12/19/20  [name], MD    Allergies    Benazepril, Ondansetron hcl, Codeine, and Morphine  Review of Systems   Review of Systems  Constitutional:  Negative for chills and fever.  Musculoskeletal:  Positive for arthralgias, gait problem and joint swelling.  Neurological:  Positive for numbness.  All other systems reviewed and are negative.  Physical Exam Updated Vital Signs BP (!) 137/107 (BP Location: Left Arm)   Pulse 92   Temp 98.1 F (36.7 C) (Oral)   Resp 18   Ht $R'5\' 6"'rz$  (1.676 m)   Wt 93 kg   SpO2 98%   BMI 33.09 kg/m   Physical Exam Vitals and nursing note reviewed.  Constitutional:      General:  She is not in acute distress.    Appearance: She is not ill-appearing.  HENT:      Head: Normocephalic.  Eyes:     Pupils: Pupils are equal, round, and reactive to light.  Cardiovascular:     Rate and Rhythm: Normal rate and regular rhythm.     Pulses: Normal pulses.     Heart sounds: Normal heart sounds. No murmur heard.   No friction rub. No gallop.  Pulmonary:     Effort: Pulmonary effort is normal.     Breath sounds: Normal breath sounds.  Abdominal:     General: Abdomen is flat. There is no distension.     Palpations: Abdomen is soft.     Tenderness: There is no abdominal tenderness. There is no guarding or rebound.  Musculoskeletal:        General: Normal range of motion.     Cervical back: Neck supple.     Comments: TTP throughout 3rd and 4th left phalanges into the metatarsals. Mild edema to dorsum of foot. Pedal pulses palpable. No tenderness throughout left ankle or knee. Soft compartments.   Skin:    General: Skin is warm and dry.  Neurological:     General: No focal deficit present.     Mental Status: She is alert.  Psychiatric:        Mood and Affect: Mood normal.        Behavior: Behavior normal.    ED Results / Procedures / Treatments   Labs (all labs ordered are listed, but only abnormal results are displayed) Labs Reviewed - No data to display  EKG None  Radiology DG Foot Complete Left  Result Date: 08/25/2021 CLINICAL DATA:  Pain and swelling, injured foot on sofa EXAM: LEFT FOOT - COMPLETE 3+ VIEW COMPARISON:  None. FINDINGS: Osteopenia. Joint spaces are preserved. Nondisplaced oblique fracture of the left fourth proximal phalanx. No other fracture or dislocation of the left foot. Soft tissue edema about the forefoot. IMPRESSION: Nondisplaced oblique fracture of the left fourth proximal phalanx. No other fracture or dislocation of the left foot. Electronically Signed   By: Delanna Ahmadi M.D.   On: 08/25/2021 10:18    Procedures Procedures   Medications Ordered in ED Medications - No data to display  ED Course  I have reviewed the  triage vital signs and the nursing notes.  Pertinent labs & imaging results that were available during my care of the patient were reviewed by me and considered in my medical decision making (see chart for details).    MDM Rules/Calculators/A&P                          65 year old female presents to the ED due to left 3rd and 4th toes x4 days after hitting them on a couch. No other injuries. Stable vitals. Patient in no acute distress.  Tenderness throughout left third and fourth metatarsals and phalanges.  Mild surrounding edema.  Left lower extremity neurovascularly intact with soft compartments.  Low suspicion for compartment syndrome.  X-ray ordered at triage which I personally reviewed which demonstrates: IMPRESSION:  Nondisplaced oblique fracture of the left fourth proximal phalanx.  No other fracture or dislocation of the left foot.   Patient placed in post operative shoe and toes were buddy taped. Crutches given for comfort. RICE discussed with patient. Patient discharged with short course of pain medication. Advised patient to follow-up with her orthopedic surgeon within 1 week for further  evaluation. Strict ED precautions discussed with patient. Patient states understanding and agrees to plan. Patient discharged home in no acute distress and stable vitals.  Discussed case with Dr. Almyra Free who agrees with assessment and plan.  Final Clinical Impression(s) / ED Diagnoses Final diagnoses:  Toe injury, left, initial encounter    Rx / DC Orders ED Discharge Orders     None        Karie Kirks 08/25/21 [place], [place] 08/29/21 [phone]

## 2021-08-25 NOTE — ED Triage Notes (Signed)
Pt states hit toes while cooking on Thursday. C/o continued pain and swelling.

## 2021-08-25 NOTE — Discharge Instructions (Addendum)
It was a pleasure taking care of you today. As discussed, you broke your 4th toe. Continue to use the crutches and shoe as needed for comfort.  I am sending you home with ibuprofen and hydrocodone.  Save hydrocodone for severe pain.  Hydrocodone can make you drowsy, so do not drive or operate machinery while on the medication. Please follow-up with your orthopedic surgeon within 1 week for further evaluation.  Continue to ice and elevate your left foot.  Return to the ER for any worsening symptoms.

## 2021-08-26 ENCOUNTER — Encounter: Payer: Self-pay | Admitting: Family

## 2021-08-26 ENCOUNTER — Ambulatory Visit (INDEPENDENT_AMBULATORY_CARE_PROVIDER_SITE_OTHER): Payer: Medicare Other | Admitting: Family

## 2021-08-26 ENCOUNTER — Ambulatory Visit: Payer: Medicare Other | Admitting: Family

## 2021-08-26 VITALS — BP 160/100 | HR 96 | Temp 97.6°F | Ht 65.0 in | Wt 198.4 lb

## 2021-08-26 DIAGNOSIS — I10 Essential (primary) hypertension: Secondary | ICD-10-CM

## 2021-08-26 DIAGNOSIS — H6990 Unspecified Eustachian tube disorder, unspecified ear: Secondary | ICD-10-CM

## 2021-08-26 DIAGNOSIS — H6980 Other specified disorders of Eustachian tube, unspecified ear: Secondary | ICD-10-CM | POA: Diagnosis not present

## 2021-08-26 DIAGNOSIS — R197 Diarrhea, unspecified: Secondary | ICD-10-CM

## 2021-08-26 LAB — CBC WITH DIFFERENTIAL/PLATELET
Basophils Absolute: 0 10*3/uL (ref 0.0–0.1)
Basophils Relative: 1 % (ref 0.0–3.0)
Eosinophils Absolute: 0.1 10*3/uL (ref 0.0–0.7)
Eosinophils Relative: 2.9 % (ref 0.0–5.0)
HCT: 36.5 % (ref 36.0–46.0)
Hemoglobin: 11.9 g/dL — ABNORMAL LOW (ref 12.0–15.0)
Lymphocytes Relative: 48.2 % — ABNORMAL HIGH (ref 12.0–46.0)
Lymphs Abs: 0.9 10*3/uL (ref 0.7–4.0)
MCHC: 32.5 g/dL (ref 30.0–36.0)
MCV: 88.9 fl (ref 78.0–100.0)
Monocytes Absolute: 0.2 10*3/uL (ref 0.1–1.0)
Monocytes Relative: 12.6 % — ABNORMAL HIGH (ref 3.0–12.0)
Neutro Abs: 0.7 10*3/uL — ABNORMAL LOW (ref 1.4–7.7)
Neutrophils Relative %: 35.3 % — ABNORMAL LOW (ref 43.0–77.0)
Platelets: 120 10*3/uL — ABNORMAL LOW (ref 150.0–400.0)
RBC: 4.1 Mil/uL (ref 3.87–5.11)
RDW: 14.3 % (ref 11.5–15.5)
WBC: 2 10*3/uL — ABNORMAL LOW (ref 4.0–10.5)

## 2021-08-26 LAB — COMPREHENSIVE METABOLIC PANEL
ALT: 15 U/L (ref 0–35)
AST: 26 U/L (ref 0–37)
Albumin: 3.8 g/dL (ref 3.5–5.2)
Alkaline Phosphatase: 52 U/L (ref 39–117)
BUN: 15 mg/dL (ref 6–23)
CO2: 26 mEq/L (ref 19–32)
Calcium: 9.2 mg/dL (ref 8.4–10.5)
Chloride: 106 mEq/L (ref 96–112)
Creatinine, Ser: 0.59 mg/dL (ref 0.40–1.20)
GFR: 93.37 mL/min (ref >60.00)
Glucose, Bld: 95 mg/dL (ref 70–99)
Potassium: 4.1 mEq/L (ref 3.5–5.1)
Sodium: 140 mEq/L (ref 135–145)
Total Bilirubin: 0.4 mg/dL (ref 0.2–1.2)
Total Protein: 8.1 g/dL (ref 6.0–8.3)

## 2021-08-26 NOTE — Progress Notes (Signed)
Tami [name] is a 65 y.o. female with the following history as recorded in EpicCare:  Patient Active Problem List   Diagnosis Date Noted   Ear fullness, bilateral 08/12/2021   Bilateral knee pain 08/08/2021   History of 2019 novel coronavirus disease (COVID-19) 12/06/2020   Low vitamin D level 07/31/2020   Acute combined systolic and diastolic heart failure ([place]) 05/09/2020   Low magnesium level 03/04/2020   Thrombocytopenia ([place]) 03/04/2020   Dysphagia 03/04/2020   Constipation 03/04/2020   Sensation of pressure in bladder area 01/17/2020   Hyperglycemia 01/17/2020   Peripheral neuropathy 11/15/2019   Chronic migraine without aura, with intractable migraine, so stated, with status migrainosus 10/17/2019   ETD (Eustachian tube dysfunction), bilateral 09/12/2019   Urinary frequency 07/17/2019   Headache 07/17/2019   Palpitation 05/15/2019   Thyroid nodule 08/26/2018   Multiple myeloma ([place]) 07/20/2018   Multiple myeloma not having achieved remission ([place]) 07/20/2018   Hematuria 07/11/2018   Seasonal allergies 06/23/2018   Right shoulder pain 06/07/2018   Neck pain 06/07/2018   Paresthesias 06/07/2018   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)    GERD (gastroesophageal reflux disease)    Bowel obstruction ([place]) 11/09/2014    Current Outpatient Medications  Medication Sig Dispense Refill   acetaminophen (TYLENOL) 500 MG tablet Take 500 mg by mouth every 6 (six) hours as needed.     acyclovir (ZOVIRAX) 400 MG tablet Take 400 mg by mouth 2 (two) times daily.     albuterol (VENTOLIN HFA) 108 (90 Base) MCG/ACT inhaler Inhale 2 puffs into the lungs every 6 (six) hours as needed for wheezing or shortness of breath. 18 g 5   aspirin EC 81 MG tablet Take 1 tablet (81 mg total) by mouth daily. 90 tablet 1   budesonide-formoterol (SYMBICORT) 160-4.5 MCG/ACT inhaler Inhale 2 puffs into the lungs 2 (two) times daily. 1 each 3   carvedilol (COREG) 25 MG tablet Take 1 tablet (25 mg total) by mouth 2 (two) times daily with a meal. 180 tablet 1   celecoxib (CELEBREX) 100 MG capsule Take 1 capsule (100 mg total) by mouth 2 (two) times daily as needed for mild pain or moderate pain (with food). 60 capsule 3   Cholecalciferol (VITAMIN D-3) 25 MCG (1000 UT) CAPS Take 1 capsule (1,000 Units total) by mouth daily. 60 capsule 2   cholestyramine (QUESTRAN) 4 g packet Take 1 packet by mouth 2 (two) times daily.     clotrimazole (GYNE-LOTRIMIN 3) 2 % vaginal cream Place 1 Applicatorful vaginally at bedtime. 21 g 0   cyclobenzaprine (FLEXERIL) 10 MG  tablet Take 1 tablet (10 mg total) by mouth at bedtime. 7 tablet 0   dicyclomine (BENTYL) 10 MG capsule Take 1 tab every 6 hours as needed for abdominal cramping. 60 capsule 0   diphenhydrAMINE (BENADRYL) 25 mg capsule TAKE ONE (1) CAPSULE BY MOUTH THREE (3) TIMES EACH DAY AS NEEDED     fluticasone (FLONASE) 50 MCG/ACT nasal spray Place 2 sprays into both nostrils daily. 16 g 0   furosemide (LASIX) 20 MG tablet Take 1 tablet by mouth as directed.     HYDROcodone-acetaminophen (NORCO/VICODIN) 5-325 MG tablet Take  1 tablet by mouth every 6 (six) hours as needed. 6 tablet 0   hydrOXYzine (ATARAX/VISTARIL) 25 MG tablet Take 25 mg by mouth 3 (three) times daily.     ibuprofen (ADVIL) 800 MG tablet Take 1 tablet (800 mg total) by mouth 3 (three) times daily. 21 tablet 0   losartan (COZAAR) 25 MG tablet Take 25 mg by mouth.     magnesium oxide (MAG-OX) 400 MG tablet Take 1 tablet (400 mg total) by mouth 2 (two) times daily. 60 tablet 1   meclizine (ANTIVERT) 12.5 MG tablet Take 12.5 mg by mouth 3 (three) times daily.     montelukast (SINGULAIR) 10 MG tablet Take 1 tablet (10 mg total) by mouth at bedtime. 30 tablet 3   Multiple Vitamins-Minerals (MULTIVITAMIN ADULT PO) Take 1 tablet by mouth daily.     omeprazole (PRILOSEC) 40 MG capsule Take 1 capsule (40 mg total) by mouth in the morning and at bedtime. 60 capsule 2   promethazine (PHENERGAN) 25 MG tablet Take 1 tablet (25 mg total) by mouth every 6 (six) hours as needed for nausea or vomiting. 30 tablet 0   promethazine-dextromethorphan (PROMETHAZINE-DM) 6.25-15 MG/5ML syrup Take 5 mLs by mouth 3 (three) times daily as needed for cough. 240 mL 1   sucralfate (CARAFATE) 1 g tablet Take 1 tablet (1 g total) by mouth 3 (three) times daily as needed. prn 90 tablet 1   topiramate (TOPAMAX) 50 MG tablet Take 1 tablet (50 mg total) by mouth 2 (two) times daily. 60 tablet 3   triamcinolone cream (KENALOG) 0.1 % Apply topically 2 (two) times daily.     zinc sulfate 220 (50 Zn) MG capsule Take 220 mg by mouth 2 (two) times daily.     zolpidem (AMBIEN) 5 MG tablet Take 1 tablet (5 mg total) by mouth at bedtime as needed for sleep. 15 tablet 1   losartan (COZAAR) 50 MG tablet Take 75 mg by mouth daily. (Patient not taking: Reported on 08/26/2021)     No current facility-administered medications for this visit.    Allergies: Benazepril, Ondansetron hcl, Codeine, and Morphine  Past Medical History:  Diagnosis Date   Abnormal SPEP 07/26/2016   Arthritis    knees, hands    Back pain 07/14/2016   Bowel obstruction ([place]) 11/2014   C. 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   Multiple myeloma ([place]) 07/20/2018   Multiple myeloma not having achieved remission ([place]) 07/20/2018   Myalgia 12/31/2016  Obstructive sleep apnea 03/31/2015   SOB (shortness of breath) 04/09/2016   Stroke ([place])    TIAs   UTI (urinary tract infection)     Past Surgical History:  Procedure Laterality Date   ABDOMINAL HYSTERECTOMY     menorraghia, 2006, total   CHOLECYSTECTOMY     COLONOSCOPY  2018   [facility] per patient   ESOPHAGOGASTRODUODENOSCOPY  2018   [facility]    KNEE SURGERY     right, repair torn torn cartialage   PACEMAKER INSERTION  08/2014   TONSILLECTOMY     TUBAL LIGATION      Family History  Problem Relation Age of Onset   Diabetes Mother    Hypertension Mother    Heart disease Mother        s/p 1 stent   Hyperlipidemia Mother    Arthritis Mother    Cancer Father        COLON   Colon cancer Father 29   Irritable bowel syndrome Sister    Hyperlipidemia Daughter    Hypertension Daughter    Hypertension Maternal Grandmother    Arthritis Maternal Grandmother    Heart disease Maternal Grandfather        MI   Hypertension Maternal Grandfather    Arthritis Maternal Grandfather    Hypertension Son    Esophageal cancer Neg Hx     Social History   Tobacco Use   Smoking status: Never   Smokeless tobacco: Never  Substance Use Topics   Alcohol use: No    Subjective:  Left ear pain x 5 days; notes that her ears feel full; per patient, she has  recently started her Flonase and Zyrtec back;  She is also concerned about persisting diarrhea which started after taking Keflex for a UTI in early October; denies any blood in her stool; mentions that she thinks her stool "looked darker" recently;   She notes she is being scheduled to have a pacemaker placed soon; per patient, she is only taking her Losartan 25 mg daily and Coreg; her blood pressure was similarly elevated when she saw her cardiologist at last [facility] on 10/5 in preparation for pacemaker being done;  patient is not sure who is actually managing her blood pressure- cardiology or PCP?     Objective:  Vitals:   08/26/21 1305 08/26/21 1506  BP: (!) 160/110 (!) 160/100  Pulse: 96   Temp: 97.6 F (36.4 C)   TempSrc: Oral   SpO2: 93%   Weight: 198 lb 6.4 oz (90 kg)   Height: _0  (1.651 m)     General: Well developed, well nourished, in no acute distress  Skin : Warm and dry.  Head: Normocephalic and atraumatic  Lungs: Respirations unlabored; clear to auscultation bilaterally without wheeze, rales, rhonchi  CVS exam: normal rate and regular rhythm.  Neurologic: Alert and oriented; speech intact; face symmetrical; moves all extremities well; CNII-XII intact without focal deficit   Assessment:  1. Diarrhea, unspecified type   2. Primary hypertension   3. Dysfunction of Eustachian tube, unspecified laterality     Plan:  Will check stool culture to rule out C. Diff since symptoms started after antibiotics were given for UTI; I have asked patient to reach out to her cardiologist about blood pressure management- ? If elevation due to need to get pacemaker replaced.  Encouraged to increase her Flonase to bid and continue her Zyrtec;   This visit occurred during the SARS-CoV-2 public health emergency.  Safety protocols  were in place, including screening questions prior to the visit, additional usage of staff PPE, and extensive cleaning of exam room while observing appropriate  contact time as indicated for disinfecting solutions.    No follow-ups on file.  Orders Placed This Encounter  Procedures   Stool Culture   CBC with Differential/Platelet   Comp Met (CMET)    Requested Prescriptions    No prescriptions requested or ordered in this encounter

## 2021-08-27 ENCOUNTER — Telehealth: Payer: Self-pay | Admitting: Family Medicine

## 2021-08-27 ENCOUNTER — Other Ambulatory Visit: Payer: Self-pay

## 2021-08-27 ENCOUNTER — Other Ambulatory Visit: Payer: Medicare Other

## 2021-08-27 DIAGNOSIS — D649 Anemia, unspecified: Secondary | ICD-10-CM

## 2021-08-27 DIAGNOSIS — R531 Weakness: Secondary | ICD-10-CM | POA: Diagnosis not present

## 2021-08-27 DIAGNOSIS — R197 Diarrhea, unspecified: Secondary | ICD-10-CM

## 2021-08-27 DIAGNOSIS — I447 Left bundle-branch block, unspecified: Secondary | ICD-10-CM | POA: Diagnosis not present

## 2021-08-27 NOTE — Addendum Note (Signed)
Addended by: Manuela Schwartz on: 08/27/2021 08:01 AM   Modules accepted: Orders

## 2021-08-27 NOTE — Telephone Encounter (Signed)
Pt. Been in ER since early this morning, ER mentioned about asking PCP about starting iron. Wants to know can something be prescribed for iron.

## 2021-08-28 ENCOUNTER — Other Ambulatory Visit: Payer: Self-pay

## 2021-08-28 ENCOUNTER — Other Ambulatory Visit (INDEPENDENT_AMBULATORY_CARE_PROVIDER_SITE_OTHER): Payer: Medicare Other

## 2021-08-28 DIAGNOSIS — D649 Anemia, unspecified: Secondary | ICD-10-CM | POA: Diagnosis not present

## 2021-08-28 NOTE — Progress Notes (Signed)
Patient advised per Mickel Baas, no iron needed at this time

## 2021-08-28 NOTE — Addendum Note (Signed)
Addended by: Randolm Idol A on: 08/28/2021 12:16 PM   Modules accepted: Orders

## 2021-08-28 NOTE — Telephone Encounter (Signed)
Lvm to call back

## 2021-08-28 NOTE — Telephone Encounter (Signed)
scheduled

## 2021-08-29 ENCOUNTER — Other Ambulatory Visit: Payer: Self-pay

## 2021-08-29 ENCOUNTER — Ambulatory Visit: Payer: Medicare Other | Admitting: Family

## 2021-08-29 DIAGNOSIS — D649 Anemia, unspecified: Secondary | ICD-10-CM

## 2021-08-29 LAB — CBC WITH DIFFERENTIAL/PLATELET
Basophils Absolute: 0 10*3/uL (ref 0.0–0.1)
Basophils Relative: 1.9 % (ref 0.0–3.0)
Eosinophils Absolute: 0 10*3/uL (ref 0.0–0.7)
Eosinophils Relative: 2.2 % (ref 0.0–5.0)
HCT: 36.3 % (ref 36.0–46.0)
Hemoglobin: 11.9 g/dL — ABNORMAL LOW (ref 12.0–15.0)
Lymphocytes Relative: 50.9 % — ABNORMAL HIGH (ref 12.0–46.0)
Lymphs Abs: 1.2 10*3/uL (ref 0.7–4.0)
MCHC: 32.9 g/dL (ref 30.0–36.0)
MCV: 89.2 fl (ref 78.0–100.0)
Monocytes Absolute: 0.2 10*3/uL (ref 0.1–1.0)
Monocytes Relative: 10.9 % (ref 3.0–12.0)
Neutro Abs: 0.8 10*3/uL — ABNORMAL LOW (ref 1.4–7.7)
Neutrophils Relative %: 34.1 % — ABNORMAL LOW (ref 43.0–77.0)
Platelets: 126 10*3/uL — ABNORMAL LOW (ref 150.0–400.0)
RBC: 4.07 Mil/uL (ref 3.87–5.11)
RDW: 13.9 % (ref 11.5–15.5)
WBC: 2.3 10*3/uL — ABNORMAL LOW (ref 4.0–10.5)

## 2021-08-29 LAB — IRON,TIBC AND FERRITIN PANEL
%SAT: 19 % (calc) (ref 16–45)
Ferritin: 720 ng/mL — ABNORMAL HIGH (ref 16–288)
Iron: 60 ug/dL (ref 45–160)
TIBC: 322 mcg/dL (calc) (ref 250–450)

## 2021-08-29 MED ORDER — FERROUS FUMARATE-FOLIC ACID 324-1 MG PO TABS: 324.0000 mg | ORAL_TABLET | Freq: Every day | ORAL | 1 refills | Status: DC

## 2021-08-30 DIAGNOSIS — R7303 Prediabetes: Secondary | ICD-10-CM | POA: Diagnosis not present

## 2021-08-30 DIAGNOSIS — Z1159 Encounter for screening for other viral diseases: Secondary | ICD-10-CM | POA: Diagnosis not present

## 2021-08-30 DIAGNOSIS — E559 Vitamin D deficiency, unspecified: Secondary | ICD-10-CM | POA: Diagnosis not present

## 2021-08-30 DIAGNOSIS — E7800 Pure hypercholesterolemia, unspecified: Secondary | ICD-10-CM | POA: Diagnosis not present

## 2021-08-30 DIAGNOSIS — Z0000 Encounter for general adult medical examination without abnormal findings: Secondary | ICD-10-CM | POA: Diagnosis not present

## 2021-08-30 DIAGNOSIS — I10 Essential (primary) hypertension: Secondary | ICD-10-CM | POA: Diagnosis not present

## 2021-08-30 DIAGNOSIS — Z79899 Other long term (current) drug therapy: Secondary | ICD-10-CM | POA: Diagnosis not present

## 2021-08-30 DIAGNOSIS — E611 Iron deficiency: Secondary | ICD-10-CM | POA: Diagnosis not present

## 2021-08-30 DIAGNOSIS — J018 Other acute sinusitis: Secondary | ICD-10-CM | POA: Diagnosis not present

## 2021-08-30 DIAGNOSIS — R5383 Other fatigue: Secondary | ICD-10-CM | POA: Diagnosis not present

## 2021-08-31 LAB — STOOL CULTURE: E coli, Shiga toxin Assay: NEGATIVE

## 2021-09-02 ENCOUNTER — Telehealth: Payer: Self-pay | Admitting: *Deleted

## 2021-09-02 DIAGNOSIS — M1712 Unilateral primary osteoarthritis, left knee: Secondary | ICD-10-CM | POA: Diagnosis not present

## 2021-09-02 DIAGNOSIS — M1711 Unilateral primary osteoarthritis, right knee: Secondary | ICD-10-CM | POA: Diagnosis not present

## 2021-09-02 NOTE — Telephone Encounter (Signed)
Prior auth started via cover my meds.  Awaiting determination.   Key: Tami [name]

## 2021-09-02 NOTE — Telephone Encounter (Signed)
Insurance denied Hemocyte F:  Dietary supplements, including prescription vitamins and mineral products (except prenatal vitamins and fluoride vitamins for children), health and beauty aids, herbal supplements and/or alternative medicines are excluded from coverage under Medicare rules. Please refer to your Evidence of Coverage (EOC) section that references Part D drug coverage in your pharmacy plan documents for more information. Reviewed by: [email or web address], R.Ph.

## 2021-09-08 ENCOUNTER — Encounter: Payer: Self-pay | Admitting: Family Medicine

## 2021-09-08 ENCOUNTER — Ambulatory Visit (INDEPENDENT_AMBULATORY_CARE_PROVIDER_SITE_OTHER): Payer: Medicare Other | Admitting: Family Medicine

## 2021-09-08 ENCOUNTER — Other Ambulatory Visit: Payer: Self-pay

## 2021-09-08 VITALS — BP 138/84 | HR 94 | Temp 98.2°F | Ht 66.0 in | Wt 198.5 lb

## 2021-09-08 DIAGNOSIS — H6983 Other specified disorders of Eustachian tube, bilateral: Secondary | ICD-10-CM | POA: Diagnosis not present

## 2021-09-08 MED ORDER — LORATADINE 10 MG PO TABS: 10.0000 mg | ORAL_TABLET | Freq: Every day | ORAL | 11 refills | Status: DC

## 2021-09-08 MED ORDER — PREDNISONE 20 MG PO TABS: 40.0000 mg | ORAL_TABLET | Freq: Every day | ORAL | 0 refills | Status: AC

## 2021-09-08 NOTE — Progress Notes (Signed)
Chief Complaint  Patient presents with   Ear Pain    Both     Pt is here for bilateral, worse on R, ear pain. Duration: 1  week Progression: unchanged Associated symptoms: jaw pain, subjective fever (no temp when she checks Denies: congestion, bleeding, or discharge from ear Treatment to date: Flonase  Past Medical History:  Diagnosis Date   Abnormal SPEP 07/26/2016   Arthritis    knees, hands   Back pain 07/14/2016   Bowel obstruction (HCC) 11/2014   C. 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   Multiple myeloma ([place]) 07/20/2018   Multiple myeloma not having achieved remission ([place]) 07/20/2018   Myalgia 12/31/2016   Obstructive sleep apnea 03/31/2015   SOB (shortness of breath) 04/09/2016   Stroke (HCC)    TIAs   UTI (urinary tract infection)     BP 138/84   Pulse 94   Temp 98.2 F (36.8 C) (Oral)   Ht $R'5\' 6"'Rv$  (1.676 m)   Wt 198 lb 8 oz (90 kg)   SpO2 94%   BMI 32.04 kg/m  General: Awake, alert, appearing stated age HEENT:  L ear- Canal patent without drainage or erythema, TM is neg R ear- canal patent without drainage or erythema, TM is slightly retracted Nose- nares patent and without discharge Mouth- Lips, gums and dentition unremarkable, pharynx is without erythema or exudate Neck: No adenopathy Lungs: Normal effort, no accessory muscle use Psych: Age appropriate judgment and insight, normal mood and affect  Dysfunction of both eustachian tubes - Plan: predniSONE (DELTASONE) 20 MG tablet  5 d pred burst. No signs of infection. She will let me know if things changes.  F/u prn.   Pt voiced understanding and agreement to the plan.  [place], DO 09/08/21 2:30 PM

## 2021-09-08 NOTE — Patient Instructions (Signed)
Claritin (loratadine), Allegra (fexofenadine), Zyrtec (cetirizine) which is also equivalent to Xyzal (levocetirizine); these are listed in order from weakest to strongest. Generic, and therefore cheaper, options are in the parentheses.   Flonase (fluticasone); nasal spray that is over the counter. 2 sprays each nostril, once daily. Aim towards the same side eye when you spray.  There are available OTC, and the generic versions, which may be cheaper, are in parentheses. Show this to a pharmacist if you have trouble finding any of these items.  Call and let me know the names of the ear drops if we aren't getting better in the next few days.   Let us know if you need anything.

## 2021-09-09 ENCOUNTER — Ambulatory Visit: Payer: Medicare Other

## 2021-09-09 ENCOUNTER — Telehealth: Payer: Self-pay | Admitting: *Deleted

## 2021-09-09 NOTE — Telephone Encounter (Signed)
Received a call from patient stating that she has been feeling weak and woozy and still experiencing diarrhea 2-4 times daily.  Sees Dr Marin Olp 11.10.2022.  Dr Marin Olp notified.  Recommended that she contact her GI doctor.  She has an appt next week with him.  Made appt here for labs and IVF per Dr Marin Olp tomorrow.  Patient appreciative of call

## 2021-09-10 ENCOUNTER — Inpatient Hospital Stay: Payer: Medicare Other

## 2021-09-10 ENCOUNTER — Other Ambulatory Visit: Payer: Self-pay

## 2021-09-10 ENCOUNTER — Inpatient Hospital Stay: Payer: Medicare Other | Attending: Hematology & Oncology

## 2021-09-10 VITALS — BP 143/79 | HR 78 | Temp 98.4°F | Resp 17

## 2021-09-10 DIAGNOSIS — Z950 Presence of cardiac pacemaker: Secondary | ICD-10-CM | POA: Diagnosis not present

## 2021-09-10 DIAGNOSIS — R197 Diarrhea, unspecified: Secondary | ICD-10-CM | POA: Insufficient documentation

## 2021-09-10 DIAGNOSIS — C9000 Multiple myeloma not having achieved remission: Secondary | ICD-10-CM

## 2021-09-10 LAB — CBC WITH DIFFERENTIAL (CANCER CENTER ONLY)
Abs Immature Granulocytes: 0.01 10*3/uL (ref 0.00–0.07)
Basophils Absolute: 0 10*3/uL (ref 0.0–0.1)
Basophils Relative: 1 %
Eosinophils Absolute: 0 10*3/uL (ref 0.0–0.5)
Eosinophils Relative: 1 %
HCT: 36.8 % (ref 36.0–46.0)
Hemoglobin: 11.8 g/dL — ABNORMAL LOW (ref 12.0–15.0)
Immature Granulocytes: 0 %
Lymphocytes Relative: 45 %
Lymphs Abs: 1.2 10*3/uL (ref 0.7–4.0)
MCH: 28.6 pg (ref 26.0–34.0)
MCHC: 32.1 g/dL (ref 30.0–36.0)
MCV: 89.3 fL (ref 80.0–100.0)
Monocytes Absolute: 0.3 10*3/uL (ref 0.1–1.0)
Monocytes Relative: 9 %
Neutro Abs: 1.2 10*3/uL — ABNORMAL LOW (ref 1.7–7.7)
Neutrophils Relative %: 44 %
Platelet Count: 126 10*3/uL — ABNORMAL LOW (ref 150–400)
RBC: 4.12 MIL/uL (ref 3.87–5.11)
RDW: 13.1 % (ref 11.5–15.5)
WBC Count: 2.8 10*3/uL — ABNORMAL LOW (ref 4.0–10.5)
nRBC: 0 % (ref 0.0–0.2)

## 2021-09-10 LAB — CMP (CANCER CENTER ONLY)
ALT: 13 U/L (ref 0–44)
AST: 24 U/L (ref 15–41)
Albumin: 3.9 g/dL (ref 3.5–5.0)
Alkaline Phosphatase: 46 U/L (ref 38–126)
Anion gap: 7 (ref 5–15)
BUN: 16 mg/dL (ref 8–23)
CO2: 26 mmol/L (ref 22–32)
Calcium: 10 mg/dL (ref 8.9–10.3)
Chloride: 108 mmol/L (ref 98–111)
Creatinine: 0.81 mg/dL (ref 0.44–1.00)
GFR, Estimated: >60 mL/min (ref >60)
Glucose, Bld: 101 mg/dL — ABNORMAL HIGH (ref 70–99)
Potassium: 3.7 mmol/L (ref 3.5–5.1)
Sodium: 141 mmol/L (ref 135–145)
Total Bilirubin: 0.4 mg/dL (ref 0.3–1.2)
Total Protein: 8.2 g/dL — ABNORMAL HIGH (ref 6.5–8.1)

## 2021-09-10 MED ORDER — SODIUM CHLORIDE 0.9 % IV SOLN: INTRAVENOUS | Status: DC

## 2021-09-10 NOTE — Patient Instructions (Signed)
Dehydration, Adult ?Dehydration is a condition in which there is not enough water or other fluids in the body. This happens when a person loses more fluids than he or she takes in. Important organs, such as the kidneys, brain, and heart, cannot function without a proper amount of fluids. Any loss of fluids from the body can lead to dehydration. ?Dehydration can be mild, moderate, or severe. It should be treated right away to prevent it from becoming severe. ?What are the causes? ?Dehydration may be caused by: ?Conditions that cause loss of water or other fluids, such as diarrhea, vomiting, or sweating or urinating a lot. ?Not drinking enough fluids, especially when you are ill or doing activities that require a lot of energy. ?Other illnesses and conditions, such as fever or infection. ?Certain medicines, such as medicines that remove excess fluid from the body (diuretics). ?Lack of safe drinking water. ?Not being able to get enough water and food. ?What increases the risk? ?The following factors may make you more likely to develop this condition: ?Having a long-term (chronic) illness that has not been treated properly, such as diabetes, heart disease, or kidney disease. ?Being 65 years of age or older. ?Having a disability. ?Living in a place that is high in altitude, where thinner, drier air causes more fluid loss. ?Doing exercises that put stress on your body for a long time (endurance sports). ?What are the signs or symptoms? ?Symptoms of dehydration depend on how severe it is. ?Mild or moderate dehydration ?Thirst. ?Dry lips or dry mouth. ?Dizziness or light-headedness, especially when standing up from a seated position. ?Muscle cramps. ?Dark urine. Urine may be the color of tea. ?Less urine or tears produced than usual. ?Headache. ?Severe dehydration ?Changes in skin. Your skin may be cold and clammy, blotchy, or pale. Your skin also may not return to normal after being lightly pinched and released. ?Little or  no tears, urine, or sweat. ?Changes in vital signs, such as rapid breathing and low blood pressure. Your pulse may be weak or may be faster than 100 beats a minute when you are sitting still. ?Other changes, such as: ?Feeling very thirsty. ?Sunken eyes. ?Cold hands and feet. ?Confusion. ?Being very tired (lethargic) or having trouble waking from sleep. ?Short-term weight loss. ?Loss of consciousness. ?How is this diagnosed? ?This condition is diagnosed based on your symptoms and a physical exam. You may have blood and urine tests to help confirm the diagnosis. ?How is this treated? ?Treatment for this condition depends on how severe it is. Treatment should be started right away. Do not wait until dehydration becomes severe. Severe dehydration is an emergency and needs to be treated in a hospital. ?Mild or moderate dehydration can be treated at home. You may be asked to: ?Drink more fluids. ?Drink an oral rehydration solution (ORS). This drink helps restore proper amounts of fluids and salts and minerals in the blood (electrolytes). ?Severe dehydration can be treated: ?With IV fluids. ?By correcting abnormal levels of electrolytes. This is often done by giving electrolytes through a tube that is passed through your nose and into your stomach (nasogastric tube, or NG tube). ?By treating the underlying cause of dehydration. ?Follow these instructions at home: ?Oral rehydration solution ?If told by your health care provider, drink an ORS: ?Make an ORS by following instructions on the package. ?Start by drinking small amounts, about ? cup (120 mL) every 5-10 minutes. ?Slowly increase how much you drink until you have taken the amount recommended by your health   care provider. ?Eating and drinking ?  ?  ?Drink enough clear fluid to keep your urine pale yellow. If you were told to drink an ORS, finish the ORS first and then start slowly drinking other clear fluids. Drink fluids such as: ?Water. Do not drink only water.  Doing that can lead to hyponatremia, which is having too little salt (sodium) in the body. ?Water from ice chips you suck on. ?Fruit juice that you have added water to (diluted fruit juice). ?Low-calorie sports drinks. ?Eat foods that contain a healthy balance of electrolytes, such as bananas, oranges, potatoes, tomatoes, and spinach. ?Do not drink alcohol. ?Avoid the following: ?Drinks that contain a lot of sugar. These include high-calorie sports drinks, fruit juice that is not diluted, and soda. ?Caffeine. ?Foods that are greasy or contain a lot of fat or sugar. ?General instructions ?Take over-the-counter and prescription medicines only as told by your health care provider. ?Do not take sodium tablets. Doing that can lead to having too much sodium in the body (hypernatremia). ?Return to your normal activities as told by your health care provider. Ask your health care provider what activities are safe for you. ?Keep all follow-up visits as told by your health care provider. This is important. ?Contact a health care provider if: ?You have muscle cramps, pain, or discomfort, such as: ?Pain in your abdomen and the pain gets worse or stays in one area (localizes). ?Stiff neck. ?You have a rash. ?You are more irritable than usual. ?You are sleepier or have a harder time waking than usual. ?You feel weak or dizzy. ?You feel very thirsty. ?Get help right away if you have: ?Any symptoms of severe dehydration. ?Symptoms of vomiting, such as: ?You cannot eat or drink without vomiting. ?Vomiting gets worse or does not go away. ?Vomit includes blood or green matter (bile). ?Symptoms that get worse with treatment. ?A fever. ?A severe headache. ?Problems with urination or bowel movements, such as: ?Diarrhea that gets worse or does not go away. ?Blood in your stool (feces). This may cause stool to look black and tarry. ?Not urinating, or urinating only a small amount of very dark urine, within 6-8 hours. ?Trouble  breathing. ?These symptoms may represent a serious problem that is an emergency. Do not wait to see if the symptoms will go away. Get medical help right away. Call your local emergency services (911 in the [place] Do not drive yourself to the hospital. ?Summary ?Dehydration is a condition in which there is not enough water or other fluids in the body. This happens when a person loses more fluids than he or she takes in. ?Treatment for this condition depends on how severe it is. Treatment should be started right away. Do not wait until dehydration becomes severe. ?Drink enough clear fluid to keep your urine pale yellow. If you were told to drink an oral rehydration solution (ORS), finish the ORS first and then start slowly drinking other clear fluids. ?Take over-the-counter and prescription medicines only as told by your health care provider. ?Get help right away if you have any symptoms of severe dehydration. ?This information is not intended to replace advice given to you by your health care provider. Make sure you discuss any questions you have with your health care provider. ?Document Revised: 06/08/2019 Document Reviewed: 06/08/2019 ?Elsevier Patient Education ? 2022 Else[employer] ? ?

## 2021-09-12 DIAGNOSIS — S92405A Nondisplaced unspecified fracture of left great toe, initial encounter for closed fracture: Secondary | ICD-10-CM | POA: Diagnosis not present

## 2021-09-12 DIAGNOSIS — S92502A Displaced unspecified fracture of left lesser toe(s), initial encounter for closed fracture: Secondary | ICD-10-CM | POA: Diagnosis not present

## 2021-09-12 DIAGNOSIS — M1711 Unilateral primary osteoarthritis, right knee: Secondary | ICD-10-CM | POA: Diagnosis not present

## 2021-09-15 DIAGNOSIS — G4733 Obstructive sleep apnea (adult) (pediatric): Secondary | ICD-10-CM | POA: Diagnosis not present

## 2021-09-15 DIAGNOSIS — R059 Cough, unspecified: Secondary | ICD-10-CM | POA: Diagnosis not present

## 2021-09-15 DIAGNOSIS — E611 Iron deficiency: Secondary | ICD-10-CM | POA: Diagnosis not present

## 2021-09-15 DIAGNOSIS — J018 Other acute sinusitis: Secondary | ICD-10-CM | POA: Diagnosis not present

## 2021-09-15 DIAGNOSIS — I509 Heart failure, unspecified: Secondary | ICD-10-CM | POA: Diagnosis not present

## 2021-09-15 DIAGNOSIS — R0602 Shortness of breath: Secondary | ICD-10-CM | POA: Diagnosis not present

## 2021-09-15 DIAGNOSIS — M2550 Pain in unspecified joint: Secondary | ICD-10-CM | POA: Diagnosis not present

## 2021-09-15 DIAGNOSIS — R1084 Generalized abdominal pain: Secondary | ICD-10-CM | POA: Diagnosis not present

## 2021-09-15 DIAGNOSIS — E785 Hyperlipidemia, unspecified: Secondary | ICD-10-CM | POA: Diagnosis not present

## 2021-09-16 ENCOUNTER — Ambulatory Visit (INDEPENDENT_AMBULATORY_CARE_PROVIDER_SITE_OTHER): Payer: Medicare Other | Admitting: Family Medicine

## 2021-09-16 ENCOUNTER — Other Ambulatory Visit: Payer: Self-pay

## 2021-09-16 ENCOUNTER — Encounter: Payer: Self-pay | Admitting: Family Medicine

## 2021-09-16 VITALS — BP 122/84 | HR 102 | Temp 98.1°F | Resp 16 | Wt 200.6 lb

## 2021-09-16 DIAGNOSIS — R110 Nausea: Secondary | ICD-10-CM | POA: Diagnosis not present

## 2021-09-16 DIAGNOSIS — E785 Hyperlipidemia, unspecified: Secondary | ICD-10-CM | POA: Diagnosis not present

## 2021-09-16 DIAGNOSIS — R700 Elevated erythrocyte sedimentation rate: Secondary | ICD-10-CM

## 2021-09-16 DIAGNOSIS — R197 Diarrhea, unspecified: Secondary | ICD-10-CM | POA: Diagnosis not present

## 2021-09-16 DIAGNOSIS — R109 Unspecified abdominal pain: Secondary | ICD-10-CM | POA: Diagnosis not present

## 2021-09-16 DIAGNOSIS — R739 Hyperglycemia, unspecified: Secondary | ICD-10-CM | POA: Diagnosis not present

## 2021-09-16 DIAGNOSIS — R000 Tachycardia, unspecified: Secondary | ICD-10-CM | POA: Diagnosis not present

## 2021-09-16 DIAGNOSIS — R7989 Other specified abnormal findings of blood chemistry: Secondary | ICD-10-CM

## 2021-09-16 DIAGNOSIS — E860 Dehydration: Secondary | ICD-10-CM

## 2021-09-16 DIAGNOSIS — R790 Abnormal level of blood mineral: Secondary | ICD-10-CM | POA: Diagnosis not present

## 2021-09-16 DIAGNOSIS — D649 Anemia, unspecified: Secondary | ICD-10-CM | POA: Diagnosis not present

## 2021-09-16 DIAGNOSIS — I10 Essential (primary) hypertension: Secondary | ICD-10-CM

## 2021-09-16 LAB — LIPID PANEL
Cholesterol: 184 mg/dL (ref 0–200)
HDL: 42 mg/dL
LDL Cholesterol: 109 mg/dL — ABNORMAL HIGH (ref 0–99)
NonHDL: 141.86
Total CHOL/HDL Ratio: 4
Triglycerides: 163 mg/dL — ABNORMAL HIGH (ref 0.0–149.0)
VLDL: 32.6 mg/dL (ref 0.0–40.0)

## 2021-09-16 LAB — COMPREHENSIVE METABOLIC PANEL
ALT: 12 U/L (ref 0–35)
AST: 24 U/L (ref 0–37)
Albumin: 3.8 g/dL (ref 3.5–5.2)
Alkaline Phosphatase: 50 U/L (ref 39–117)
BUN: 19 mg/dL (ref 6–23)
CO2: 25 mEq/L (ref 19–32)
Calcium: 9.2 mg/dL (ref 8.4–10.5)
Chloride: 105 mEq/L (ref 96–112)
Creatinine, Ser: 0.58 mg/dL (ref 0.40–1.20)
GFR: 93.72 mL/min (ref >60.00)
Glucose, Bld: 91 mg/dL (ref 70–99)
Potassium: 4.1 mEq/L (ref 3.5–5.1)
Sodium: 140 mEq/L (ref 135–145)
Total Bilirubin: 0.4 mg/dL (ref 0.2–1.2)
Total Protein: 8 g/dL (ref 6.0–8.3)

## 2021-09-16 LAB — CBC
HCT: 37.1 % (ref 36.0–46.0)
Hemoglobin: 12.1 g/dL (ref 12.0–15.0)
MCHC: 32.7 g/dL (ref 30.0–36.0)
MCV: 89 fl (ref 78.0–100.0)
Platelets: 111 10*3/uL — ABNORMAL LOW (ref 150.0–400.0)
RBC: 4.17 Mil/uL (ref 3.87–5.11)
RDW: 14.1 % (ref 11.5–15.5)
WBC: 2.4 10*3/uL — ABNORMAL LOW (ref 4.0–10.5)

## 2021-09-16 LAB — HEMOGLOBIN A1C: Hgb A1c MFr Bld: 6 % (ref 4.6–6.5)

## 2021-09-16 LAB — MAGNESIUM: Magnesium: 1.8 mg/dL (ref 1.5–2.5)

## 2021-09-16 LAB — SEDIMENTATION RATE: Sed Rate: >130 mm/h — ABNORMAL HIGH (ref 0–30)

## 2021-09-16 LAB — VITAMIN D 25 HYDROXY (VIT D DEFICIENCY, FRACTURES): VITD: 36.33 ng/mL (ref 30.00–100.00)

## 2021-09-16 LAB — TSH: TSH: 1.51 u[IU]/mL (ref 0.35–5.50)

## 2021-09-16 NOTE — Assessment & Plan Note (Signed)
Increase leafy greens, consider increased lean red meat and using cast iron cookware. Continue to monitor, report any concerns. Tolerating Hemocyte F daily

## 2021-09-16 NOTE — Patient Instructions (Signed)
Pedialyte, gatorade, sprite and water  Bland Diet A bland diet consists of foods that are often soft and do not have a lot of fat, fiber, or extra seasonings. Foods without fat, fiber, or seasoning are easier for the body to digest. They are also less likely to irritate your mouth, throat, stomach, and other parts of your digestive system. A bland diet is sometimes called a BRAT diet. What is my plan? Your health care provider or food and nutrition specialist (dietitian) may recommend specific changes to your diet to prevent symptoms or to treat your symptoms. These changes may include: Eating small meals often. Cooking food until it is soft enough to chew easily. Chewing your food well. Drinking fluids slowly. Not eating foods that are very spicy, sour, or fatty. Not eating citrus fruits, such as oranges and grapefruit. What do I need to know about this diet? Eat a variety of foods from the bland diet food list. Do not follow a bland diet longer than needed. Ask your health care provider whether you should take vitamins or supplements. What foods can I eat? Grains Hot cereals, such as cream of wheat. Rice. Bread, crackers, or tortillas made from refined white flour. Vegetables Canned or cooked vegetables. Mashed or boiled potatoes. Fruits Bananas. Applesauce. Other types of cooked or canned fruit with the skin and seeds removed, such as canned peaches or pears. Meats and other proteins Scrambled eggs. Creamy peanut butter or other nut butters. Lean, well-cooked meats, such as chicken or fish. Tofu. Soups or broths. Dairy Low-fat dairy products, such as milk, cottage cheese, or yogurt. Beverages Water. Herbal tea. Apple juice. Fats and oils Mild salad dressings. Canola or olive oil. Sweets and desserts Pudding. Custard. Fruit gelatin. Ice cream. The items listed above may not be a complete list of recommended foods and beverages. Contact a dietitian for more options. What foods  are not recommended? Grains Whole grain breads and cereals. Vegetables Raw vegetables. Fruits Raw fruits, especially citrus, berries, or dried fruits. Dairy Whole fat dairy foods. Beverages Caffeinated drinks. Alcohol. Seasonings and condiments Strongly flavored seasonings or condiments. Hot sauce. Salsa. Other foods Spicy foods. Fried foods. Sour foods, such as pickled or fermented foods. Foods with high sugar content. Foods high in fiber. The items listed above may not be a complete list of foods and beverages to avoid. Contact a dietitian for more information. Summary A bland diet consists of foods that are often soft and do not have a lot of fat, fiber, or extra seasonings. Foods without fat, fiber, or seasoning are easier for the body to digest. Check with your health care provider to see how long you should follow this diet plan. It is not meant to be followed for long periods. This information is not intended to replace advice given to you by your health care provider. Make sure you discuss any questions you have with your health care provider. Document Revised: 11/24/2017 Document Reviewed: 11/24/2017 Elsevier Patient Education  [place]. Diarrhea, Adult Diarrhea is frequent loose and watery bowel movements. Diarrhea can make you feel weak and cause you to become dehydrated. Dehydration can make you tired and thirsty, cause you to have a dry mouth, and decrease how often you urinate. Diarrhea typically lasts 2-3 days. However, it can last longer if it is a sign of something more serious. It is important to treat your diarrhea as told by your health care provider. Follow these instructions at home: Eating and drinking   Follow these  recommendations as told by your health care provider: Take an oral rehydration solution (ORS). This is an over-the-counter medicine that helps return your body to its normal balance of nutrients and water. It is found at pharmacies and  retail stores. Drink plenty of fluids, such as water, ice chips, diluted fruit juice, and low-calorie sports drinks. You can drink milk also, if desired. Avoid drinking fluids that contain a lot of sugar or caffeine, such as energy drinks, sports drinks, and soda. Eat bland, easy-to-digest foods in small amounts as you are able. These foods include bananas, applesauce, rice, lean meats, toast, and crackers. Avoid alcohol. Avoid spicy or fatty foods.  Medicines Take over-the-counter and prescription medicines only as told by your health care provider. If you were prescribed an antibiotic medicine, take it as told by your health care provider. Do not stop using the antibiotic even if you start to feel better. General instructions  Wash your hands often using soap and water. If soap and water are not available, use a hand sanitizer. Others in the household should wash their hands as well. Hands should be washed: After using the toilet or changing a diaper. Before preparing, cooking, or serving food. While caring for a sick person or while visiting someone in a hospital. Drink enough fluid to keep your urine pale yellow. Rest at home while you recover. Watch your condition for any changes. Take a warm bath to relieve any burning or pain from frequent diarrhea episodes. Keep all follow-up visits as told by your health care provider. This is important. Contact a health care provider if: You have a fever. Your diarrhea gets worse. You have new symptoms. You cannot keep fluids down. You feel light-headed or dizzy. You have a headache. You have muscle cramps. Get help right away if: You have chest pain. You feel extremely weak or you faint. You have bloody or black stools or stools that look like tar. You have severe pain, cramping, or bloating in your abdomen. You have trouble breathing or you are breathing very quickly. Your heart is beating very quickly. Your skin feels cold and  clammy. You feel confused. You have signs of dehydration, such as: Dark urine, very little urine, or no urine. Cracked lips. Dry mouth. Sunken eyes. Sleepiness. Weakness. Summary Diarrhea is frequent loose and sometimes watery bowel movements. Diarrhea can make you feel weak and cause you to become dehydrated. Drink enough fluids to keep your urine pale yellow. Make sure that you wash your hands after using the toilet. If soap and water are not available, use hand sanitizer. Contact a health care provider if your diarrhea gets worse or you have new symptoms. Get help right away if you have signs of dehydration. This information is not intended to replace advice given to you by your health care provider. Make sure you discuss any questions you have with your health care provider. Document Revised: 05/07/2021 Document Reviewed: 05/07/2021 Elsevier Patient Education  [place].

## 2021-09-16 NOTE — Assessment & Plan Note (Signed)
Well controlled, no changes to meds. Encouraged heart healthy diet such as the DASH diet and exercise as tolerated.  °

## 2021-09-16 NOTE — Assessment & Plan Note (Signed)
Supplement and monitor 

## 2021-09-16 NOTE — Assessment & Plan Note (Signed)
She is not eating and drinking well and she is having numerous loose stool daily. She has dry mucus membranes and she is encouraged to hydrate with pedialyte, gatorade, sprite and water by drinking every 5 minutes if she cannot get her hydration status improved she may have to present for further care.

## 2021-09-16 NOTE — Assessment & Plan Note (Signed)
Supplementation

## 2021-09-16 NOTE — Assessment & Plan Note (Signed)
hgba1c acceptable, minimize simple carbs. Increase exercise as tolerated.  

## 2021-09-16 NOTE — Progress Notes (Signed)
[name], female    DOB: Oct 12, 1956  Age: 65 y.o. MRN: [id number]    Subjective:   Chief Complaint  Patient presents with   2 months follow up   Diarrhea   Abdominal Pain   Subjective   HPI [name] presents for office visit today for follow up on broken left and GERD. She is not doing well due to the increase of diarrhea more than her baseline. She describes her BM's as loose and dark. At the moment she is taking imodium and pepto-bismal for the diarrhea. 4 days after her visit to gastro her diarrhea returned. Sore and right side of abdomen hurts. She has poor appetite nausea. Been going on since last month. Denies CP/palp/SOB/HA/congestion/fevers or GU c/o. Taking meds as prescribed.  Tongue is dry and pasty.   Review of Systems  Constitutional:  Negative for chills, fatigue and fever.  HENT:  Negative for congestion, rhinorrhea, sinus pressure, sinus pain and sore throat.   Eyes:  Negative for pain.  Respiratory:  Negative for cough and shortness of breath.   Cardiovascular:  Negative for chest pain, palpitations and leg swelling.  Gastrointestinal:  Positive for abdominal pain, diarrhea and nausea. Negative for blood in stool and vomiting.  Genitourinary:  Negative for decreased urine volume, flank pain, frequency, vaginal bleeding and vaginal discharge.  Musculoskeletal:  Negative for back pain.  Neurological:  Negative for headaches.   History Past Medical History:  Diagnosis Date   Abnormal SPEP 07/26/2016   Arthritis    knees, hands   Back pain 07/14/2016   Bowel obstruction ([place]) 11/2014   C. 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   Her family history includes Arthritis in her maternal grandfather, maternal grandmother, and mother; Cancer in her father; Colon cancer (age of onset: 17) in her father; Diabetes in her mother; Heart disease in her maternal grandfather and mother; Hyperlipidemia in her daughter and mother; Hypertension in her daughter, maternal grandfather, maternal grandmother, mother, and son; Irritable bowel syndrome in her sister.She reports that she has never smoked. She has never used smokeless tobacco. She reports that she does not drink alcohol and does not use drugs.  Current Outpatient Medications on File Prior to Visit  Medication Sig Dispense Refill   acetaminophen (TYLENOL) 500 MG tablet Take 500 mg by mouth every 6 (six) hours as needed.     acyclovir (ZOVIRAX) 400 MG tablet Take 400 mg by mouth 2 (two) times daily.     albuterol (VENTOLIN HFA) 108 (90 Base) MCG/ACT inhaler Inhale 2 puffs into the lungs every 6 (six) hours as needed  for wheezing or shortness of breath. 18 g 5   aspirin EC 81 MG tablet Take 1 tablet (81 mg total) by mouth daily. 90 tablet 1   budesonide-formoterol (SYMBICORT) 160-4.5 MCG/ACT inhaler Inhale 2 puffs into the lungs 2 (two) times daily. 1 each 3   carvedilol (COREG) 25 MG tablet Take 1 tablet (25 mg total) by mouth 2 (two) times daily with a meal. 180 tablet 1   celecoxib (CELEBREX) 100 MG capsule Take 1 capsule (100 mg  total) by mouth 2 (two) times daily as needed for mild pain or moderate pain (with food). 60 capsule 3   Cholecalciferol (VITAMIN D-3) 25 MCG (1000 UT) CAPS Take 1 capsule (1,000 Units total) by mouth daily. 60 capsule 2   cholestyramine (QUESTRAN) 4 g packet Take 1 packet by mouth 2 (two) times daily.     clotrimazole (GYNE-LOTRIMIN 3) 2 % vaginal cream Place 1 Applicatorful vaginally at bedtime. 21 g 0   cyclobenzaprine (FLEXERIL) 10 MG tablet Take 1 tablet (10 mg total) by mouth at bedtime. 7 tablet 0   dicyclomine (BENTYL) 10 MG capsule Take 1 tab every 6 hours as needed for abdominal cramping. 60 capsule 0   diphenhydrAMINE (BENADRYL) 25 mg capsule TAKE ONE (1) CAPSULE BY MOUTH THREE (3) TIMES EACH DAY AS NEEDED     Ferrous Fumarate-Folic Acid 742-5 MG TABS Take 324 mg by mouth daily. 90 tablet 1   fluticasone (FLONASE) 50 MCG/ACT nasal spray Place 2 sprays into both nostrils daily. 16 g 0   furosemide (LASIX) 20 MG tablet Take 1 tablet by mouth as directed.     HYDROcodone-acetaminophen (NORCO/VICODIN) 5-325 MG tablet Take 1 tablet by mouth every 6 (six) hours as needed. 6 tablet 0   hydrOXYzine (ATARAX/VISTARIL) 25 MG tablet Take 25 mg by mouth 3 (three) times daily.     ibuprofen (ADVIL) 800 MG tablet Take 1 tablet (800 mg total) by mouth 3 (three) times daily. 21 tablet 0   loratadine (CLARITIN) 10 MG tablet Take 1 tablet (10 mg total) by mouth daily. 30 tablet 11   losartan (COZAAR) 25 MG tablet Take 25 mg by mouth.     losartan (COZAAR) 50 MG tablet Take 75 mg by mouth daily.     magnesium oxide (MAG-OX) 400 MG tablet Take 1 tablet (400 mg total) by mouth 2 (two) times daily. 60 tablet 1   meclizine (ANTIVERT) 12.5 MG tablet Take 12.5 mg by mouth 3 (three) times daily.     montelukast (SINGULAIR) 10 MG tablet Take 1 tablet (10 mg total) by mouth at bedtime. 30 tablet 3   Multiple Vitamins-Minerals (MULTIVITAMIN ADULT PO) Take 1 tablet by mouth daily.     omeprazole (PRILOSEC) 40 MG  capsule Take 1 capsule (40 mg total) by mouth in the morning and at bedtime. 60 capsule 2   promethazine (PHENERGAN) 25 MG tablet Take 1 tablet (25 mg total) by mouth every 6 (six) hours as needed for nausea or vomiting. 30 tablet 0   sucralfate (CARAFATE) 1 g tablet Take 1 tablet (1 g total) by mouth 3 (three) times daily as needed. prn 90 tablet 1   topiramate (TOPAMAX) 50 MG tablet Take 1 tablet (50 mg total) by mouth 2 (two) times daily. 60 tablet 3   triamcinolone cream (KENALOG) 0.1 % Apply topically 2 (two) times daily.     zinc sulfate 220 (50 Zn) MG capsule Take 220 mg by mouth 2 (two) times daily.  zolpidem (AMBIEN) 5 MG tablet Take 1 tablet (5 mg total) by mouth at bedtime as needed for sleep. 15 tablet 1   [DISCONTINUED] prochlorperazine (COMPAZINE) 10 MG tablet Take 1 tablet (10 mg total) by mouth every 6 (six) hours as needed (Nausea or vomiting). 30 tablet 1   No current facility-administered medications on file prior to visit.     Objective:  Objective  Physical Exam Constitutional:      General: She is not in acute distress.    Appearance: Normal appearance. She is not ill-appearing or toxic-appearing.  HENT:     Head: Normocephalic and atraumatic.     Right Ear: Tympanic membrane, ear canal and external ear normal.     Left Ear: Tympanic membrane, ear canal and external ear normal.     Nose: No congestion or rhinorrhea.     Mouth/Throat:     Mouth: Mucous membranes are dry.  Eyes:     Extraocular Movements: Extraocular movements intact.     Pupils: Pupils are equal, round, and reactive to light.  Cardiovascular:     Rate and Rhythm: Regular rhythm. Tachycardia present.     Pulses: Normal pulses.     Heart sounds: Normal heart sounds. No murmur heard. Pulmonary:     Effort: Pulmonary effort is normal. No respiratory distress.     Breath sounds: Normal breath sounds. No wheezing, rhonchi or rales.  Abdominal:     General: Bowel sounds are normal.      Palpations: Abdomen is soft. There is no mass.     Tenderness: There is generalized abdominal tenderness and tenderness in the right upper quadrant and right lower quadrant. There is no guarding.     Hernia: No hernia is present.  Musculoskeletal:        General: Normal range of motion.     Cervical back: Normal range of motion and neck supple.  Skin:    General: Skin is warm and dry.  Neurological:     Mental Status: She is alert and oriented to person, place, and time.  Psychiatric:        Behavior: Behavior normal.   BP 122/84   Pulse (!) 102   Temp 98.1 F (36.7 C)   Resp 16   Wt 200 lb 9.6 oz (91 kg)   SpO2 94%   BMI 32.38 kg/m  Wt Readings from Last 3 Encounters:  09/16/21 200 lb 9.6 oz (91 kg)  09/08/21 198 lb 8 oz (90 kg)  08/26/21 198 lb 6.4 oz (90 kg)     Lab Results  Component Value Date   WBC 2.4 Repeated and verified X2. (L) 09/16/2021   HGB 12.1 09/16/2021   HCT 37.1 09/16/2021   PLT 111.0 (L) 09/16/2021   GLUCOSE 91 09/16/2021   CHOL 184 09/16/2021   TRIG 163.0 (H) 09/16/2021   HDL 42.00 09/16/2021   LDLCALC 109 (H) 09/16/2021   ALT 12 09/16/2021   AST 24 09/16/2021   NA 140 09/16/2021   K 4.1 09/16/2021   CL 105 09/16/2021   CREATININE 0.58 09/16/2021   BUN 19 09/16/2021   CO2 25 09/16/2021   TSH 1.51 09/16/2021   INR 0.90 07/28/2018   HGBA1C 6.0 09/16/2021    DG Foot Complete Left  Result Date: 08/25/2021 CLINICAL DATA:  Pain and swelling, injured foot on sofa EXAM: LEFT FOOT - COMPLETE 3+ VIEW COMPARISON:  None. FINDINGS: Osteopenia. Joint spaces are preserved. Nondisplaced oblique fracture of the left fourth proximal phalanx. No  other fracture or dislocation of the left foot. Soft tissue edema about the forefoot. IMPRESSION: Nondisplaced oblique fracture of the left fourth proximal phalanx. No other fracture or dislocation of the left foot. Electronically Signed   By: Delanna Ahmadi M.D.   On: 08/25/2021 10:18     Assessment & Plan:  Plan     No orders of the defined types were placed in this encounter.   Problem List Items Addressed This Visit     Hyperlipidemia    Encourage heart healthy diet such as MIND or DASH diet, increase exercise, avoid trans fats, simple carbohydrates and processed foods, consider a krill or fish or flaxseed oil cap daily.       Relevant Orders   Lipid panel (Completed)   Hypertension    Well controlled, no changes to meds. Encouraged heart healthy diet such as the DASH diet and exercise as tolerated.       Relevant Orders   Comprehensive metabolic panel (Completed)   TSH (Completed)   AP (abdominal pain) - Primary    Is struggling with increased abdominal pain over the past few weeks. Is noting an increase in loose stools over the last few weeks. Notes they are dark but not sticky and she is taking Pepto Bismol and iron supplements. Notes poor appetite and nausea but no vomiting. Pain is notable on right side with palpation. Sed rate is increased and she is having numerous loose stool daily. Have ordered labs and CT scan to further evaluate      Relevant Orders   CT Abdomen Pelvis W Contrast   Elevated sed rate    Supplementation       Relevant Orders   Sedimentation rate (Completed)   CT Abdomen Pelvis W Contrast   Anemia    Increase leafy greens, consider increased lean red meat and using cast iron cookware. Continue to monitor, report any concerns. Tolerating Hemocyte F daily      Diarrhea    With a history of Cdiff but she has not had any recent new antibiotics. Recheck Cdiff today      Relevant Orders   Clostridium Difficile by PCR([facility])   CBC (Completed)   CT Abdomen Pelvis W Contrast   Hyperglycemia    hgba1c acceptable, minimize simple carbs. Increase exercise as tolerated.       Relevant Orders   Hemoglobin A1c (Completed)   Low magnesium level    Supplement and monitor      Relevant Orders   Magnesium (Completed)   Low vitamin D level     Supplement and monitor      Relevant Orders   VITAMIN D 25 Hydroxy (Vit-D Deficiency, Fractures) (Completed)   Dehydration    She is not eating and drinking well and she is having numerous loose stool daily. She has dry mucus membranes and she is encouraged to hydrate with pedialyte, gatorade, sprite and water by drinking every 5 minutes if she cannot get her hydration status improved she may have to present for further care.       Relevant Orders   CT Abdomen Pelvis W Contrast   Other Visit Diagnoses     Tachycardia       Relevant Orders   CT Abdomen Pelvis W Contrast   Nausea           Follow-up: Return in about 2 months (around 11/16/2021) for f/u visit.  I, Suezanne Jacquet, acting as a scribe for Penni Homans, MD, have documented all relevent  documentation on behalf of Penni Homans, MD, as directed by Penni Homans, MD while in the presence of Penni Homans, MD. DO:09/16/21.  I, Mosie Lukes, MD personally performed the services described in this documentation. All medical record entries made by the scribe were at my direction and in my presence. I have reviewed the chart and agree that the record reflects my personal performance and is accurate and complete

## 2021-09-16 NOTE — Assessment & Plan Note (Signed)
Encourage heart healthy diet such as MIND or DASH diet, increase exercise, avoid trans fats, simple carbohydrates and processed foods, consider a krill or fish or flaxseed oil cap daily.  °

## 2021-09-16 NOTE — Assessment & Plan Note (Addendum)
Is struggling with increased abdominal pain over the past few weeks. Is noting an increase in loose stools over the last few weeks. Notes they are dark but not sticky and she is taking Pepto Bismol and iron supplements. Notes poor appetite and nausea but no vomiting. Pain is notable on right side with palpation. Sed rate is increased and she is having numerous loose stool daily. Have ordered labs and CT scan to further evaluate

## 2021-09-16 NOTE — Assessment & Plan Note (Signed)
With a history of Cdiff but she has not had any recent new antibiotics. Recheck Cdiff today

## 2021-09-17 DIAGNOSIS — R197 Diarrhea, unspecified: Secondary | ICD-10-CM | POA: Diagnosis not present

## 2021-09-17 DIAGNOSIS — I428 Other cardiomyopathies: Secondary | ICD-10-CM | POA: Diagnosis not present

## 2021-09-17 DIAGNOSIS — Z792 Long term (current) use of antibiotics: Secondary | ICD-10-CM | POA: Diagnosis not present

## 2021-09-17 DIAGNOSIS — R109 Unspecified abdominal pain: Secondary | ICD-10-CM | POA: Diagnosis not present

## 2021-09-17 DIAGNOSIS — C9001 Multiple myeloma in remission: Secondary | ICD-10-CM | POA: Diagnosis not present

## 2021-09-17 DIAGNOSIS — Z95810 Presence of automatic (implantable) cardiac defibrillator: Secondary | ICD-10-CM | POA: Diagnosis not present

## 2021-09-18 ENCOUNTER — Telehealth: Payer: Self-pay | Admitting: *Deleted

## 2021-09-18 ENCOUNTER — Encounter: Payer: Self-pay | Admitting: Hematology & Oncology

## 2021-09-18 ENCOUNTER — Inpatient Hospital Stay: Payer: Medicare Other

## 2021-09-18 ENCOUNTER — Other Ambulatory Visit: Payer: Self-pay

## 2021-09-18 ENCOUNTER — Inpatient Hospital Stay (HOSPITAL_BASED_OUTPATIENT_CLINIC_OR_DEPARTMENT_OTHER): Payer: Medicare Other | Admitting: Hematology & Oncology

## 2021-09-18 VITALS — BP 164/97 | HR 87 | Temp 97.9°F | Resp 18 | Wt 197.2 lb

## 2021-09-18 DIAGNOSIS — R197 Diarrhea, unspecified: Secondary | ICD-10-CM | POA: Diagnosis not present

## 2021-09-18 DIAGNOSIS — C9000 Multiple myeloma not having achieved remission: Secondary | ICD-10-CM | POA: Diagnosis not present

## 2021-09-18 DIAGNOSIS — R110 Nausea: Secondary | ICD-10-CM | POA: Diagnosis not present

## 2021-09-18 DIAGNOSIS — Z950 Presence of cardiac pacemaker: Secondary | ICD-10-CM | POA: Diagnosis not present

## 2021-09-18 LAB — CBC WITH DIFFERENTIAL (CANCER CENTER ONLY)
Abs Immature Granulocytes: 0 10*3/uL (ref 0.00–0.07)
Basophils Absolute: 0 10*3/uL (ref 0.0–0.1)
Basophils Relative: 1 %
Eosinophils Absolute: 0.1 10*3/uL (ref 0.0–0.5)
Eosinophils Relative: 2 %
HCT: 37.9 % (ref 36.0–46.0)
Hemoglobin: 12.3 g/dL (ref 12.0–15.0)
Immature Granulocytes: 0 %
Lymphocytes Relative: 33 %
Lymphs Abs: 0.9 10*3/uL (ref 0.7–4.0)
MCH: 29 pg (ref 26.0–34.0)
MCHC: 32.5 g/dL (ref 30.0–36.0)
MCV: 89.4 fL (ref 80.0–100.0)
Monocytes Absolute: 0.2 10*3/uL (ref 0.1–1.0)
Monocytes Relative: 9 %
Neutro Abs: 1.6 10*3/uL — ABNORMAL LOW (ref 1.7–7.7)
Neutrophils Relative %: 55 %
Platelet Count: 115 10*3/uL — ABNORMAL LOW (ref 150–400)
RBC: 4.24 MIL/uL (ref 3.87–5.11)
RDW: 12.8 % (ref 11.5–15.5)
WBC Count: 2.8 10*3/uL — ABNORMAL LOW (ref 4.0–10.5)
nRBC: 0 % (ref 0.0–0.2)

## 2021-09-18 LAB — CMP (CANCER CENTER ONLY)
ALT: 12 U/L (ref 0–44)
AST: 23 U/L (ref 15–41)
Albumin: 4.1 g/dL (ref 3.5–5.0)
Alkaline Phosphatase: 51 U/L (ref 38–126)
Anion gap: 8 (ref 5–15)
BUN: 18 mg/dL (ref 8–23)
CO2: 28 mmol/L (ref 22–32)
Calcium: 10.1 mg/dL (ref 8.9–10.3)
Chloride: 104 mmol/L (ref 98–111)
Creatinine: 0.68 mg/dL (ref 0.44–1.00)
GFR, Estimated: >60 mL/min (ref >60)
Glucose, Bld: 101 mg/dL — ABNORMAL HIGH (ref 70–99)
Potassium: 3.8 mmol/L (ref 3.5–5.1)
Sodium: 140 mmol/L (ref 135–145)
Total Bilirubin: 0.5 mg/dL (ref 0.3–1.2)
Total Protein: 8.4 g/dL — ABNORMAL HIGH (ref 6.5–8.1)

## 2021-09-18 LAB — LACTATE DEHYDROGENASE: LDH: 236 U/L — ABNORMAL HIGH (ref 98–192)

## 2021-09-18 LAB — CLOSTRIDIUM DIFFICILE BY PCR: Toxigenic C. Difficile by PCR: POSITIVE — AB

## 2021-09-18 MED ORDER — VANCOMYCIN HCL 125 MG PO CAPS: 125.0000 mg | ORAL_CAPSULE | Freq: Four times a day (QID) | ORAL | 0 refills | Status: DC

## 2021-09-18 NOTE — Progress Notes (Signed)
Hematology and Oncology Follow Up Visit  Tami [name] [id number] 11/27/1955 65 y.o. 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  Unfortunately, she is going to have to have a upper endoscopy today.  She is having a lot of abdominal pain.  She is having diarrhea.  Hopefully, the Gastroenterologist will be able to figure out what might be going on.  She has had no fever.  She has had no bleeding.  There has been no issues with urine.  Thankfully, her myeloma has been steady.  We last saw her, her monoclonal spike was 1.5 g/dL.  The Kappa light chain was 4.3 mg/dL.  The IgG level was stable at 2300 mg/dL.  She has had no leg swelling.  She has a pacemaker placed now.  This was done in October.  Her appetite is a little bit down because of the abdominal issues.  She has had no swollen lymph nodes.  Overall, I would say performance status is probably ECOG 1.   Medications:  Allergies as of 09/18/2021       Reactions   Benazepril Anaphylaxis, Swelling, Hives   angioedema Throat and lip swelling   Ondansetron Hcl Hives   Redness and hives post IV admin on 07/05/17   Codeine Nausea And Vomiting   Morphine Hives   Redness and hives noted post IV admin on 07/05/17        Medication List        Accurate as of September 18, 2021  8:43 AM. If you have any questions, ask your nurse or doctor.          acetaminophen 500 MG tablet Commonly known as: TYLENOL Take 500 mg by mouth every 6 (six) hours as needed.   acyclovir 400 MG tablet Commonly known as:  ZOVIRAX Take 400 mg by mouth 2 (two) times daily.   albuterol 108 (90 Base) MCG/ACT inhaler Commonly known as: VENTOLIN HFA Inhale 2 puffs into the lungs every 6 (six) hours as needed for wheezing or shortness of breath.   aspirin EC 81 MG tablet Take 1 tablet (81 mg total) by mouth daily.   budesonide-formoterol 160-4.5 MCG/ACT inhaler Commonly known as: SYMBICORT Inhale 2 puffs into the lungs 2 (two) times daily.   carvedilol 25 MG tablet Commonly known as: COREG Take 1 tablet (25 mg total) by mouth 2 (two) times daily with a meal.   celecoxib 100 MG capsule Commonly known as: CeleBREX Take 1 capsule (100 mg total) by mouth 2 (two) times daily as needed for mild pain or moderate pain (with food).   cholestyramine 4 g packet Commonly known as: QUESTRAN Take 1 packet by mouth 2 (two) times daily.   clotrimazole 2 % vaginal cream Commonly known as: GYNE-LOTRIMIN 3 Place 1 Applicatorful vaginally at bedtime.   cyclobenzaprine 10 MG tablet Commonly known as: FLEXERIL Take 1 tablet (10 mg total) by mouth at bedtime.   dicyclomine 10 MG capsule Commonly known as: BENTYL Take 1 tab every 6 hours as needed for abdominal cramping.   diphenhydrAMINE  25 mg capsule Commonly known as: BENADRYL TAKE ONE (1) CAPSULE BY MOUTH THREE (3) TIMES EACH DAY AS NEEDED   Ferrous Fumarate-Folic Acid 161-0 MG Tabs Take 324 mg by mouth daily.   fluticasone 50 MCG/ACT nasal spray Commonly known as: FLONASE Place 2 sprays into both nostrils daily.   furosemide 20 MG tablet Commonly known as: LASIX Take 1 tablet by mouth as directed.   HYDROcodone-acetaminophen 5-325 MG tablet Commonly known as: NORCO/VICODIN Take 1 tablet by mouth every 6 (six) hours as needed.   hydrOXYzine 25 MG tablet Commonly known as: ATARAX/VISTARIL Take 25 mg by mouth 3 (three) times daily.   ibuprofen 800 MG tablet Commonly known as: ADVIL Take 1 tablet (800 mg total) by mouth 3 (three) times daily.    loratadine 10 MG tablet Commonly known as: CLARITIN Take 1 tablet (10 mg total) by mouth daily.   losartan 25 MG tablet Commonly known as: COZAAR Take 25 mg by mouth.   losartan 50 MG tablet Commonly known as: COZAAR Take 75 mg by mouth daily.   magnesium oxide 400 MG tablet Commonly known as: MAG-OX Take 1 tablet (400 mg total) by mouth 2 (two) times daily.   meclizine 12.5 MG tablet Commonly known as: ANTIVERT Take 12.5 mg by mouth 3 (three) times daily.   montelukast 10 MG tablet Commonly known as: Singulair Take 1 tablet (10 mg total) by mouth at bedtime.   MULTIVITAMIN ADULT PO Take 1 tablet by mouth daily.   omeprazole 40 MG capsule Commonly known as: PRILOSEC Take 1 capsule (40 mg total) by mouth in the morning and at bedtime.   promethazine 25 MG tablet Commonly known as: PHENERGAN Take 1 tablet (25 mg total) by mouth every 6 (six) hours as needed for nausea or vomiting.   sucralfate 1 g tablet Commonly known as: Carafate Take 1 tablet (1 g total) by mouth 3 (three) times daily as needed. prn   topiramate 50 MG tablet Commonly known as: Topamax Take 1 tablet (50 mg total) by mouth 2 (two) times daily.   triamcinolone cream 0.1 % Commonly known as: KENALOG Apply topically 2 (two) times daily.   Vitamin D-3 25 MCG (1000 UT) Caps Take 1 capsule (1,000 Units total) by mouth daily.   zinc sulfate 220 (50 Zn) MG capsule Take 220 mg by mouth 2 (two) times daily.   zolpidem 5 MG tablet Commonly known as: AMBIEN Take 1 tablet (5 mg total) by mouth at bedtime as needed for sleep.        Allergies:  Allergies  Allergen Reactions   Benazepril Anaphylaxis, Swelling and Hives    angioedema Throat and lip swelling   Ondansetron Hcl Hives    Redness and hives post IV admin on 07/05/17   Codeine Nausea And Vomiting   Morphine Hives    Redness and hives noted post IV admin on 07/05/17    Past Medical History, Surgical history, Social history, and  Family History were reviewed and updated.  Review of Systems: Review of Systems  Constitutional: Negative.   HENT: Negative.    Eyes: Negative.   Respiratory: Negative.    Cardiovascular: Negative.   Gastrointestinal: Negative.   Genitourinary: Negative.   Musculoskeletal: Negative.   Skin: Negative.   Neurological: Negative.   Endo/Heme/Allergies: Negative.   Psychiatric/Behavioral: Negative.       Physical Exam:  weight is 197 lb 4 oz (89.5 kg). Her oral temperature is 97.9 F (36.6 C). Her blood pressure is 164/97 (  abnormal) and her pulse is 87. Her respiration is 18 and oxygen saturation is 99%.   Wt Readings from Last 3 Encounters:  09/18/21 197 lb 4 oz (89.5 kg)  09/16/21 200 lb 9.6 oz (91 kg)  09/08/21 198 lb 8 oz (90 kg)    Physical Exam Vitals reviewed.  HENT:     Head: Normocephalic and atraumatic.  Eyes:     Pupils: Pupils are equal, round, and reactive to light.  Cardiovascular:     Rate and Rhythm: Normal rate and regular rhythm.     Heart sounds: Normal heart sounds.  Pulmonary:     Effort: Pulmonary effort is normal.     Breath sounds: Normal breath sounds.  Abdominal:     General: Bowel sounds are normal.     Palpations: Abdomen is soft.  Musculoskeletal:        General: No tenderness or deformity. Normal range of motion.     Cervical back: Normal range of motion.  Lymphadenopathy:     Cervical: No cervical adenopathy.  Skin:    General: Skin is warm and dry.     Findings: No erythema or rash.  Neurological:     Mental Status: She is alert and oriented to person, place, and time.  Psychiatric:        Behavior: Behavior normal.        Thought Content: Thought content normal.        Judgment: Judgment normal.     Lab Results  Component Value Date   WBC 2.8 (L) 09/18/2021   HGB 12.3 09/18/2021   HCT 37.9 09/18/2021   MCV 89.4 09/18/2021   PLT 115 (L) 09/18/2021   Lab Results  Component Value Date   FERRITIN 720 (H) 08/28/2021   IRON  60 08/28/2021   TIBC 322 08/28/2021   UIBC 160 08/26/2020   IRONPCTSAT 19 08/28/2021   Lab Results  Component Value Date   RETICCTPCT 0.8 07/16/2016   RBC 4.24 09/18/2021   RETICCTABS 32,640 07/16/2016   Lab Results  Component Value Date   KPAFRELGTCHN 43.0 (H) 08/08/2021   LAMBDASER 5.9 08/08/2021   KAPLAMBRATIO 7.29 (H) 08/08/2021   Lab Results  Component Value Date   IGGSERUM 2,379 (H) 08/08/2021   IGGSERUM 2,550 (H) 08/08/2021   IGA 104 08/08/2021   IGA 122 08/08/2021   IGMSERUM 86 08/08/2021   IGMSERUM 92 08/08/2021   Lab Results  Component Value Date   TOTALPROTELP 7.6 08/08/2021   ALBUMINELP 3.5 08/08/2021   A1GS 0.2 08/08/2021   A2GS 0.9 08/08/2021   BETS 1.0 08/08/2021   BETA2SER 0.5 07/16/2016   GAMS 2.1 (H) 08/08/2021   MSPIKE 1.5 (H) 08/08/2021   SPEI Comment 12/19/2020     Chemistry      Component Value Date/Time   NA 140 09/18/2021 0802   NA 143 11/08/2017 1127   NA 140 12/31/2016 1000   K 3.8 09/18/2021 0802   K 4.0 11/08/2017 1127   K 3.6 12/31/2016 1000   CL 104 09/18/2021 0802   CL 107 11/08/2017 1127   CO2 28 09/18/2021 0802   CO2 25 11/08/2017 1127   CO2 24 12/31/2016 1000   BUN 18 09/18/2021 0802   BUN 19 11/08/2017 1127   BUN 22.3 12/31/2016 1000   CREATININE 0.68 09/18/2021 0802   CREATININE 0.9 11/08/2017 1127   CREATININE 0.8 12/31/2016 1000      Component Value Date/Time   CALCIUM 10.1 09/18/2021 0802   CALCIUM 9.9 11/08/2017  1127   CALCIUM 9.5 12/31/2016 1000   ALKPHOS 51 09/18/2021 0802   ALKPHOS 44 11/08/2017 1127   ALKPHOS 51 12/31/2016 1000   AST 23 09/18/2021 0802   AST 28 12/31/2016 1000   ALT 12 09/18/2021 0802   ALT 36 11/08/2017 1127   ALT 16 12/31/2016 1000   BILITOT 0.5 09/18/2021 0802   BILITOT 0.46 12/31/2016 1000       Impression and Plan: Tami [name] is a very pleasant 65 yo African American female with a previous IgG kappa MGUS with progression to myeloma.   So far, she been off treatment now for  about 11 months.  It will be interesting to see what her monoclonal studies look like right now.  I really hope that everything goes well with the upper endoscopy.  Her lab work looks fine to be able to have it done.  I hope that we can now get her back after the Christmas holiday.  I will try to hold off until next year.  It sounds like her abdomen is the biggest issue right now.   [name], MD 11/10/20228:43 AM

## 2021-09-18 NOTE — Telephone Encounter (Signed)
Per 09/18/21 los gave upcoming appointments - confirmed

## 2021-09-19 ENCOUNTER — Ambulatory Visit (HOSPITAL_BASED_OUTPATIENT_CLINIC_OR_DEPARTMENT_OTHER): Payer: Medicare Other

## 2021-09-19 DIAGNOSIS — L929 Granulomatous disorder of the skin and subcutaneous tissue, unspecified: Secondary | ICD-10-CM | POA: Diagnosis not present

## 2021-09-19 DIAGNOSIS — R197 Diarrhea, unspecified: Secondary | ICD-10-CM | POA: Diagnosis not present

## 2021-09-19 DIAGNOSIS — K7689 Other specified diseases of liver: Secondary | ICD-10-CM | POA: Diagnosis not present

## 2021-09-19 DIAGNOSIS — N281 Cyst of kidney, acquired: Secondary | ICD-10-CM | POA: Diagnosis not present

## 2021-09-19 DIAGNOSIS — R000 Tachycardia, unspecified: Secondary | ICD-10-CM | POA: Diagnosis not present

## 2021-09-19 DIAGNOSIS — R1084 Generalized abdominal pain: Secondary | ICD-10-CM | POA: Diagnosis not present

## 2021-09-19 LAB — IGG, IGA, IGM
IgA: 125 mg/dL (ref 87–352)
IgG (Immunoglobin G), Serum: 2598 mg/dL — ABNORMAL HIGH (ref 586–1602)
IgM (Immunoglobulin M), Srm: 105 mg/dL (ref 26–217)

## 2021-09-19 LAB — KAPPA/LAMBDA LIGHT CHAINS
Kappa free light chain: 61.1 mg/L — ABNORMAL HIGH (ref 3.3–19.4)
Kappa, lambda light chain ratio: 16.08 — ABNORMAL HIGH (ref 0.26–1.65)
Lambda free light chains: 3.8 mg/L — ABNORMAL LOW (ref 5.7–26.3)

## 2021-09-23 ENCOUNTER — Telehealth: Payer: Self-pay

## 2021-09-23 DIAGNOSIS — E611 Iron deficiency: Secondary | ICD-10-CM | POA: Diagnosis not present

## 2021-09-23 DIAGNOSIS — R030 Elevated blood-pressure reading, without diagnosis of hypertension: Secondary | ICD-10-CM | POA: Diagnosis not present

## 2021-09-23 DIAGNOSIS — Z79899 Other long term (current) drug therapy: Secondary | ICD-10-CM | POA: Diagnosis not present

## 2021-09-23 DIAGNOSIS — I509 Heart failure, unspecified: Secondary | ICD-10-CM | POA: Diagnosis not present

## 2021-09-23 DIAGNOSIS — E785 Hyperlipidemia, unspecified: Secondary | ICD-10-CM | POA: Diagnosis not present

## 2021-09-23 DIAGNOSIS — R1084 Generalized abdominal pain: Secondary | ICD-10-CM | POA: Diagnosis not present

## 2021-09-23 LAB — IMMUNOFIXATION REFLEX, SERUM
IgA: 130 mg/dL (ref 87–352)
IgG (Immunoglobin G), Serum: 3178 mg/dL — ABNORMAL HIGH (ref 586–1602)
IgM (Immunoglobulin M), Srm: 116 mg/dL (ref 26–217)

## 2021-09-23 LAB — PROTEIN ELECTROPHORESIS, SERUM, WITH REFLEX
A/G Ratio: 0.8 (ref 0.7–1.7)
Albumin ELP: 3.7 g/dL (ref 2.9–4.4)
Alpha-1-Globulin: 0.2 g/dL (ref 0.0–0.4)
Alpha-2-Globulin: 1 g/dL (ref 0.4–1.0)
Beta Globulin: 1 g/dL (ref 0.7–1.3)
Gamma Globulin: 2.2 g/dL — ABNORMAL HIGH (ref 0.4–1.8)
Globulin, Total: 4.4 g/dL — ABNORMAL HIGH (ref 2.2–3.9)
M-Spike, %: 1.3 g/dL — ABNORMAL HIGH
SPEP Interpretation: 0
Total Protein ELP: 8.1 g/dL (ref 6.0–8.5)

## 2021-09-23 NOTE — Telephone Encounter (Signed)
-----   Message from [name], MD sent at 09/23/2021  1:40 PM EST ----- Call - the myeloma protein is lower at 1.3.  can she tell you if the GI MD found anything with her EGD??  Tami [name]

## 2021-09-23 NOTE — Telephone Encounter (Signed)
Called and informed patient of lab results, patient verbalized understanding. Pt states that her EGD was fine and GI is suggesting a colonoscopy which she will do next. Pt tested positive for C-diff when in the ER 09/19/21.

## 2021-09-24 ENCOUNTER — Telehealth: Payer: Self-pay | Admitting: Family Medicine

## 2021-09-24 NOTE — Telephone Encounter (Signed)
Pt wanted to go over her ct scan that needs to be scheduled. She would not give any more info just wanted Tami [name] to call her. Please advise.

## 2021-09-25 DIAGNOSIS — Z79899 Other long term (current) drug therapy: Secondary | ICD-10-CM | POA: Diagnosis not present

## 2021-09-25 NOTE — Telephone Encounter (Signed)
Pt was told to reschedule ct scan appointment. Imaging number was given

## 2021-09-26 ENCOUNTER — Other Ambulatory Visit (HOSPITAL_BASED_OUTPATIENT_CLINIC_OR_DEPARTMENT_OTHER): Payer: Medicare Other

## 2021-09-29 ENCOUNTER — Ambulatory Visit (HOSPITAL_BASED_OUTPATIENT_CLINIC_OR_DEPARTMENT_OTHER)
Admission: RE | Admit: 2021-09-29 | Discharge: 2021-09-29 | Disposition: A | Discharge status: Home / Self Care | Payer: Medicare Other | Source: Ambulatory Visit | Attending: Family Medicine | Admitting: Family Medicine

## 2021-09-29 ENCOUNTER — Other Ambulatory Visit: Payer: Self-pay

## 2021-09-29 DIAGNOSIS — Z9889 Other specified postprocedural states: Secondary | ICD-10-CM | POA: Diagnosis not present

## 2021-09-29 DIAGNOSIS — E860 Dehydration: Secondary | ICD-10-CM | POA: Diagnosis not present

## 2021-09-29 DIAGNOSIS — R700 Elevated erythrocyte sedimentation rate: Secondary | ICD-10-CM | POA: Insufficient documentation

## 2021-09-29 DIAGNOSIS — R000 Tachycardia, unspecified: Secondary | ICD-10-CM | POA: Insufficient documentation

## 2021-09-29 DIAGNOSIS — R1110 Vomiting, unspecified: Secondary | ICD-10-CM | POA: Diagnosis not present

## 2021-09-29 DIAGNOSIS — R109 Unspecified abdominal pain: Secondary | ICD-10-CM | POA: Diagnosis not present

## 2021-09-29 DIAGNOSIS — R197 Diarrhea, unspecified: Secondary | ICD-10-CM | POA: Insufficient documentation

## 2021-09-29 MED ORDER — IOHEXOL 300 MG/ML  SOLN
100.0000 mL | Freq: Once | INTRAMUSCULAR | Status: AC | PRN
  Administered 2021-09-29: 100 mL via INTRAVENOUS

## 2021-10-09 DIAGNOSIS — R1084 Generalized abdominal pain: Secondary | ICD-10-CM | POA: Diagnosis not present

## 2021-10-09 DIAGNOSIS — R0602 Shortness of breath: Secondary | ICD-10-CM | POA: Diagnosis not present

## 2021-10-09 DIAGNOSIS — J018 Other acute sinusitis: Secondary | ICD-10-CM | POA: Diagnosis not present

## 2021-10-09 DIAGNOSIS — Z79899 Other long term (current) drug therapy: Secondary | ICD-10-CM | POA: Diagnosis not present

## 2021-10-09 DIAGNOSIS — E785 Hyperlipidemia, unspecified: Secondary | ICD-10-CM | POA: Diagnosis not present

## 2021-10-09 DIAGNOSIS — R059 Cough, unspecified: Secondary | ICD-10-CM | POA: Diagnosis not present

## 2021-10-13 ENCOUNTER — Inpatient Hospital Stay: Payer: Medicare Other

## 2021-10-13 ENCOUNTER — Inpatient Hospital Stay: Payer: Medicare Other | Attending: Hematology & Oncology

## 2021-10-13 ENCOUNTER — Encounter: Payer: Self-pay | Admitting: Family Medicine

## 2021-10-13 ENCOUNTER — Other Ambulatory Visit: Payer: Self-pay

## 2021-10-13 ENCOUNTER — Other Ambulatory Visit: Payer: Self-pay | Admitting: *Deleted

## 2021-10-13 ENCOUNTER — Ambulatory Visit (INDEPENDENT_AMBULATORY_CARE_PROVIDER_SITE_OTHER): Payer: Medicare Other | Admitting: Family Medicine

## 2021-10-13 VITALS — BP 122/70 | HR 105 | Temp 98.1°F | Ht 66.0 in | Wt 196.0 lb

## 2021-10-13 VITALS — BP 134/77 | HR 90 | Temp 98.1°F | Resp 17

## 2021-10-13 DIAGNOSIS — C9000 Multiple myeloma not having achieved remission: Secondary | ICD-10-CM | POA: Diagnosis not present

## 2021-10-13 DIAGNOSIS — H9201 Otalgia, right ear: Secondary | ICD-10-CM

## 2021-10-13 DIAGNOSIS — Z79899 Other long term (current) drug therapy: Secondary | ICD-10-CM | POA: Diagnosis not present

## 2021-10-13 DIAGNOSIS — E860 Dehydration: Secondary | ICD-10-CM

## 2021-10-13 LAB — COMPREHENSIVE METABOLIC PANEL
ALT: 15 U/L (ref 0–44)
AST: 22 U/L (ref 15–41)
Albumin: 3.6 g/dL (ref 3.5–5.0)
Alkaline Phosphatase: 47 U/L (ref 38–126)
Anion gap: 9 (ref 5–15)
BUN: 15 mg/dL (ref 8–23)
CO2: 27 mmol/L (ref 22–32)
Calcium: 9.8 mg/dL (ref 8.9–10.3)
Chloride: 106 mmol/L (ref 98–111)
Creatinine, Ser: 0.7 mg/dL (ref 0.44–1.00)
GFR, Estimated: >60 mL/min (ref >60)
Glucose, Bld: 168 mg/dL — ABNORMAL HIGH (ref 70–99)
Potassium: 3.4 mmol/L — ABNORMAL LOW (ref 3.5–5.1)
Sodium: 142 mmol/L (ref 135–145)
Total Bilirubin: 0.4 mg/dL (ref 0.3–1.2)
Total Protein: 7.9 g/dL (ref 6.5–8.1)

## 2021-10-13 LAB — CBC WITH DIFFERENTIAL (CANCER CENTER ONLY)
Abs Immature Granulocytes: 0 10*3/uL (ref 0.00–0.07)
Basophils Absolute: 0 10*3/uL (ref 0.0–0.1)
Basophils Relative: 1 %
Eosinophils Absolute: 0 10*3/uL (ref 0.0–0.5)
Eosinophils Relative: 2 %
HCT: 37.3 % (ref 36.0–46.0)
Hemoglobin: 12.1 g/dL (ref 12.0–15.0)
Immature Granulocytes: 0 %
Lymphocytes Relative: 37 %
Lymphs Abs: 0.9 10*3/uL (ref 0.7–4.0)
MCH: 29.2 pg (ref 26.0–34.0)
MCHC: 32.4 g/dL (ref 30.0–36.0)
MCV: 89.9 fL (ref 80.0–100.0)
Monocytes Absolute: 0.2 10*3/uL (ref 0.1–1.0)
Monocytes Relative: 10 %
Neutro Abs: 1.2 10*3/uL — ABNORMAL LOW (ref 1.7–7.7)
Neutrophils Relative %: 50 %
Platelet Count: 115 10*3/uL — ABNORMAL LOW (ref 150–400)
RBC: 4.15 MIL/uL (ref 3.87–5.11)
RDW: 13.1 % (ref 11.5–15.5)
WBC Count: 2.3 10*3/uL — ABNORMAL LOW (ref 4.0–10.5)
nRBC: 0 % (ref 0.0–0.2)

## 2021-10-13 MED ORDER — SODIUM CHLORIDE 0.9 % IV SOLN: 1000.0000 mL | INTRAVENOUS | Status: AC

## 2021-10-13 MED ORDER — TIZANIDINE HCL 4 MG PO TABS
4.0000 mg | ORAL_TABLET | Freq: Four times a day (QID) | ORAL | 0 refills | Status: DC | PRN
Start: 2021-10-13 — End: 2022-04-13

## 2021-10-13 MED ORDER — PREDNISONE 20 MG PO TABS: 40.0000 mg | ORAL_TABLET | Freq: Every day | ORAL | 0 refills | Status: AC

## 2021-10-13 NOTE — Progress Notes (Signed)
Chief Complaint  Patient presents with   Ear Pain    right    Pt is here for bilateral ear pain. Duration: 2 days Progression: unchanged Associated symptoms: None Denies: sore throat, jaw pain, congestion, post nasal drip, coryza, sneezing, and sinus pressure, bleeding, or discharge from ear Treatment to date: OTC ear drops  Past Medical History:  Diagnosis Date   Abnormal SPEP 07/26/2016   Arthritis    knees, hands   Back pain 07/14/2016   Bowel obstruction ([place]) 11/2014   C. 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   Multiple myeloma ([place]) 07/20/2018   Multiple myeloma not having achieved remission ([place]) 07/20/2018   Myalgia 12/31/2016   Obstructive sleep apnea 03/31/2015   SOB (shortness of breath) 04/09/2016   Stroke (HCC)    TIAs   UTI (urinary tract infection)     BP 122/70   Pulse (!) 105   Temp 98.1 F (36.7 C) (Oral)   Ht $R'5\' 6"'tg$  (1.676 m)   Wt 196 lb (88.9 kg)   SpO2 96%   BMI 31.64 kg/m  General: Awake, alert, appearing stated age HEENT:  L ear- Canal patent without drainage or erythema, TM is neg R ear- canal patent without drainage or erythema, TM is neg Nose- nares patent and without discharge Mouth- Lips, gums and dentition unremarkable, pharynx is without erythema or exudate Neck: No adenopathy Lungs: Normal effort, no accessory muscle use MSK: +TTP over R TMJ over L TMJ Psych: Age appropriate judgment and insight, normal mood and affect  Right ear pain - Plan: predniSONE (DELTASONE) 20 MG tablet, tiZANidine (ZANAFLEX) 4 MG tablet  TMJ seems likely, will send in 5 d  pred burst to cover for ETD as well, Zanaflex prn. Heat, ice, Tylenol.  F/u prn.  Pt voiced understanding and agreement to the plan.  Grady, DO 10/13/21 11:28 AM

## 2021-10-13 NOTE — Patient Instructions (Signed)
Dehydration, Adult ?Dehydration is a condition in which there is not enough water or other fluids in the body. This happens when a person loses more fluids than he or she takes in. Important organs, such as the kidneys, brain, and heart, cannot function without a proper amount of fluids. Any loss of fluids from the body can lead to dehydration. ?Dehydration can be mild, moderate, or severe. It should be treated right away to prevent it from becoming severe. ?What are the causes? ?Dehydration may be caused by: ?Conditions that cause loss of water or other fluids, such as diarrhea, vomiting, or sweating or urinating a lot. ?Not drinking enough fluids, especially when you are ill or doing activities that require a lot of energy. ?Other illnesses and conditions, such as fever or infection. ?Certain medicines, such as medicines that remove excess fluid from the body (diuretics). ?Lack of safe drinking water. ?Not being able to get enough water and food. ?What increases the risk? ?The following factors may make you more likely to develop this condition: ?Having a long-term (chronic) illness that has not been treated properly, such as diabetes, heart disease, or kidney disease. ?Being 65 years of age or older. ?Having a disability. ?Living in a place that is high in altitude, where thinner, drier air causes more fluid loss. ?Doing exercises that put stress on your body for a long time (endurance sports). ?What are the signs or symptoms? ?Symptoms of dehydration depend on how severe it is. ?Mild or moderate dehydration ?Thirst. ?Dry lips or dry mouth. ?Dizziness or light-headedness, especially when standing up from a seated position. ?Muscle cramps. ?Dark urine. Urine may be the color of tea. ?Less urine or tears produced than usual. ?Headache. ?Severe dehydration ?Changes in skin. Your skin may be cold and clammy, blotchy, or pale. Your skin also may not return to normal after being lightly pinched and released. ?Little or  no tears, urine, or sweat. ?Changes in vital signs, such as rapid breathing and low blood pressure. Your pulse may be weak or may be faster than 100 beats a minute when you are sitting still. ?Other changes, such as: ?Feeling very thirsty. ?Sunken eyes. ?Cold hands and feet. ?Confusion. ?Being very tired (lethargic) or having trouble waking from sleep. ?Short-term weight loss. ?Loss of consciousness. ?How is this diagnosed? ?This condition is diagnosed based on your symptoms and a physical exam. You may have blood and urine tests to help confirm the diagnosis. ?How is this treated? ?Treatment for this condition depends on how severe it is. Treatment should be started right away. Do not wait until dehydration becomes severe. Severe dehydration is an emergency and needs to be treated in a hospital. ?Mild or moderate dehydration can be treated at home. You may be asked to: ?Drink more fluids. ?Drink an oral rehydration solution (ORS). This drink helps restore proper amounts of fluids and salts and minerals in the blood (electrolytes). ?Severe dehydration can be treated: ?With IV fluids. ?By correcting abnormal levels of electrolytes. This is often done by giving electrolytes through a tube that is passed through your nose and into your stomach (nasogastric tube, or NG tube). ?By treating the underlying cause of dehydration. ?Follow these instructions at home: ?Oral rehydration solution ?If told by your health care provider, drink an ORS: ?Make an ORS by following instructions on the package. ?Start by drinking small amounts, about ? cup (120 mL) every 5-10 minutes. ?Slowly increase how much you drink until you have taken the amount recommended by your health   care provider. ?Eating and drinking ?  ?  ?Drink enough clear fluid to keep your urine pale yellow. If you were told to drink an ORS, finish the ORS first and then start slowly drinking other clear fluids. Drink fluids such as: ?Water. Do not drink only water.  Doing that can lead to hyponatremia, which is having too little salt (sodium) in the body. ?Water from ice chips you suck on. ?Fruit juice that you have added water to (diluted fruit juice). ?Low-calorie sports drinks. ?Eat foods that contain a healthy balance of electrolytes, such as bananas, oranges, potatoes, tomatoes, and spinach. ?Do not drink alcohol. ?Avoid the following: ?Drinks that contain a lot of sugar. These include high-calorie sports drinks, fruit juice that is not diluted, and soda. ?Caffeine. ?Foods that are greasy or contain a lot of fat or sugar. ?General instructions ?Take over-the-counter and prescription medicines only as told by your health care provider. ?Do not take sodium tablets. Doing that can lead to having too much sodium in the body (hypernatremia). ?Return to your normal activities as told by your health care provider. Ask your health care provider what activities are safe for you. ?Keep all follow-up visits as told by your health care provider. This is important. ?Contact a health care provider if: ?You have muscle cramps, pain, or discomfort, such as: ?Pain in your abdomen and the pain gets worse or stays in one area (localizes). ?Stiff neck. ?You have a rash. ?You are more irritable than usual. ?You are sleepier or have a harder time waking than usual. ?You feel weak or dizzy. ?You feel very thirsty. ?Get help right away if you have: ?Any symptoms of severe dehydration. ?Symptoms of vomiting, such as: ?You cannot eat or drink without vomiting. ?Vomiting gets worse or does not go away. ?Vomit includes blood or green matter (bile). ?Symptoms that get worse with treatment. ?A fever. ?A severe headache. ?Problems with urination or bowel movements, such as: ?Diarrhea that gets worse or does not go away. ?Blood in your stool (feces). This may cause stool to look black and tarry. ?Not urinating, or urinating only a small amount of very dark urine, within 6-8 hours. ?Trouble  breathing. ?These symptoms may represent a serious problem that is an emergency. Do not wait to see if the symptoms will go away. Get medical help right away. Call your local emergency services (911 in the [place] Do not drive yourself to the hospital. ?Summary ?Dehydration is a condition in which there is not enough water or other fluids in the body. This happens when a person loses more fluids than he or she takes in. ?Treatment for this condition depends on how severe it is. Treatment should be started right away. Do not wait until dehydration becomes severe. ?Drink enough clear fluid to keep your urine pale yellow. If you were told to drink an oral rehydration solution (ORS), finish the ORS first and then start slowly drinking other clear fluids. ?Take over-the-counter and prescription medicines only as told by your health care provider. ?Get help right away if you have any symptoms of severe dehydration. ?This information is not intended to replace advice given to you by your health care provider. Make sure you discuss any questions you have with your health care provider. ?Document Revised: 06/08/2019 Document Reviewed: 06/08/2019 ?Elsevier Patient Education ? 2022 Else[employer] ? ?

## 2021-10-13 NOTE — Patient Instructions (Signed)
Avoid a lot of chewing in the next week or so.  Ice/cold pack over area for 10-15 min twice daily.  OK to take Tylenol 1000 mg (2 extra strength tabs) or 975 mg (3 regular strength tabs) every 6 hours as needed.  Heat (pad or rice pillow in microwave) over affected area, 10-15 minutes twice daily.   Let us know if you need anything.

## 2021-10-14 DIAGNOSIS — R197 Diarrhea, unspecified: Secondary | ICD-10-CM | POA: Diagnosis not present

## 2021-10-14 DIAGNOSIS — R000 Tachycardia, unspecified: Secondary | ICD-10-CM | POA: Diagnosis not present

## 2021-10-14 DIAGNOSIS — R519 Headache, unspecified: Secondary | ICD-10-CM | POA: Diagnosis not present

## 2021-10-14 DIAGNOSIS — Z743 Need for continuous supervision: Secondary | ICD-10-CM | POA: Diagnosis not present

## 2021-10-14 DIAGNOSIS — G43909 Migraine, unspecified, not intractable, without status migrainosus: Secondary | ICD-10-CM | POA: Diagnosis not present

## 2021-10-14 DIAGNOSIS — Z8579 Personal history of other malignant neoplasms of lymphoid, hematopoietic and related tissues: Secondary | ICD-10-CM | POA: Diagnosis not present

## 2021-10-14 DIAGNOSIS — I493 Ventricular premature depolarization: Secondary | ICD-10-CM | POA: Diagnosis not present

## 2021-10-14 DIAGNOSIS — E860 Dehydration: Secondary | ICD-10-CM | POA: Diagnosis not present

## 2021-10-14 DIAGNOSIS — R6889 Other general symptoms and signs: Secondary | ICD-10-CM | POA: Diagnosis not present

## 2021-10-14 DIAGNOSIS — G4489 Other headache syndrome: Secondary | ICD-10-CM | POA: Diagnosis not present

## 2021-10-14 DIAGNOSIS — I447 Left bundle-branch block, unspecified: Secondary | ICD-10-CM | POA: Diagnosis not present

## 2021-10-15 DIAGNOSIS — I447 Left bundle-branch block, unspecified: Secondary | ICD-10-CM | POA: Diagnosis not present

## 2021-10-15 DIAGNOSIS — I517 Cardiomegaly: Secondary | ICD-10-CM | POA: Diagnosis not present

## 2021-10-15 DIAGNOSIS — I493 Ventricular premature depolarization: Secondary | ICD-10-CM | POA: Diagnosis not present

## 2021-10-16 DIAGNOSIS — R109 Unspecified abdominal pain: Secondary | ICD-10-CM | POA: Diagnosis not present

## 2021-10-16 DIAGNOSIS — N281 Cyst of kidney, acquired: Secondary | ICD-10-CM | POA: Diagnosis not present

## 2021-10-16 DIAGNOSIS — R1111 Vomiting without nausea: Secondary | ICD-10-CM | POA: Diagnosis not present

## 2021-10-16 DIAGNOSIS — R1084 Generalized abdominal pain: Secondary | ICD-10-CM | POA: Diagnosis not present

## 2021-10-16 DIAGNOSIS — C9000 Multiple myeloma not having achieved remission: Secondary | ICD-10-CM | POA: Diagnosis not present

## 2021-10-16 DIAGNOSIS — K7689 Other specified diseases of liver: Secondary | ICD-10-CM | POA: Diagnosis not present

## 2021-10-16 DIAGNOSIS — R531 Weakness: Secondary | ICD-10-CM | POA: Diagnosis not present

## 2021-10-16 DIAGNOSIS — R197 Diarrhea, unspecified: Secondary | ICD-10-CM | POA: Diagnosis not present

## 2021-10-16 DIAGNOSIS — E860 Dehydration: Secondary | ICD-10-CM | POA: Diagnosis not present

## 2021-10-16 DIAGNOSIS — R112 Nausea with vomiting, unspecified: Secondary | ICD-10-CM | POA: Diagnosis not present

## 2021-10-16 DIAGNOSIS — I447 Left bundle-branch block, unspecified: Secondary | ICD-10-CM | POA: Diagnosis not present

## 2021-10-17 ENCOUNTER — Ambulatory Visit (INDEPENDENT_AMBULATORY_CARE_PROVIDER_SITE_OTHER): Payer: Medicare Other

## 2021-10-17 VITALS — Ht 66.0 in | Wt 193.0 lb

## 2021-10-17 DIAGNOSIS — Z0000 Encounter for general adult medical examination without abnormal findings: Secondary | ICD-10-CM

## 2021-10-17 DIAGNOSIS — Z780 Asymptomatic menopausal state: Secondary | ICD-10-CM | POA: Diagnosis not present

## 2021-10-17 DIAGNOSIS — I447 Left bundle-branch block, unspecified: Secondary | ICD-10-CM | POA: Diagnosis not present

## 2021-10-17 NOTE — Patient Instructions (Addendum)
Ms. Doutt , Thank you for taking time to complete your Medicare Wellness Visit. I appreciate your ongoing commitment to your health goals. Please review the following plan we discussed and let me know if I can assist you in the future.   Screening recommendations/referrals: Colonoscopy: Completed 10/19/2018-Due 10/19/2028 Mammogram: Completed 03/28/2021-Due 03/28/2022 Bone Density: Ordered today. Someone will call you to schedule. Recommended yearly ophthalmology/optometry visit for glaucoma screening and checkup Recommended yearly dental visit for hygiene and checkup  Vaccinations: Influenza vaccine: Up to date Pneumococcal vaccine: Up to date Tdap vaccine: Up to date Shingles vaccine: Discuss with pharmacy   Covid-19:Booster available at the pharmacy  Advanced directives: Please bring a [place] for your chart.   Conditions/risks identified: See problem list  Next appointment: Follow up in one year for your annual wellness visit 10/23/2022 @ 3:40 (phone visit)   Preventive Care 67 Years and Older, Female Preventive care refers to lifestyle choices and visits with your health care provider that can promote health and wellness. What does preventive care include? A yearly physical exam. This is also called an annual well check. Dental exams once or twice a year. Routine eye exams. Ask your health care provider how often you should have your eyes checked. Personal lifestyle choices, including: Daily care of your teeth and gums. Regular physical activity. Eating a healthy diet. Avoiding tobacco and drug use. Limiting alcohol use. Practicing safe sex. Taking low-dose aspirin every day. Taking vitamin and mineral supplements as recommended by your health care provider. What happens during an annual well check? The services and screenings done by your health care provider during your annual well check will depend on your age, overall health, lifestyle risk  factors, and family history of disease. Counseling  Your health care provider may ask you questions about your: Alcohol use. Tobacco use. Drug use. Emotional well-being. Home and relationship well-being. Sexual activity. Eating habits. History of falls. Memory and ability to understand (cognition). Work and work Statistician. Reproductive health. Screening  You may have the following tests or measurements: Height, weight, and BMI. Blood pressure. Lipid and cholesterol levels. These may be checked every 5 years, or more frequently if you are over 93 years old. Skin check. Lung cancer screening. You may have this screening every year starting at age 61 if you have a 30-pack-year history of smoking and currently smoke or have quit within the past 15 years. Fecal occult blood test (FOBT) of the stool. You may have this test every year starting at age 41. Flexible sigmoidoscopy or colonoscopy. You may have a sigmoidoscopy every 5 years or a colonoscopy every 10 years starting at age 53. Hepatitis C blood test. Hepatitis B blood test. Sexually transmitted disease (STD) testing. Diabetes screening. This is done by checking your blood sugar (glucose) after you have not eaten for a while (fasting). You may have this done every 1-3 years. Bone density scan. This is done to screen for osteoporosis. You may have this done starting at age 40. Mammogram. This may be done every 1-2 years. Talk to your health care provider about how often you should have regular mammograms. Talk with your health care provider about your test results, treatment options, and if necessary, the need for more tests. Vaccines  Your health care provider may recommend certain vaccines, such as: Influenza vaccine. This is recommended every year. Tetanus, diphtheria, and acellular pertussis (Tdap, Td) vaccine. You may need a Td booster every 10 years. Zoster vaccine. You may need  this after age 54. Pneumococcal 13-valent  conjugate (PCV13) vaccine. One dose is recommended after age 48. Pneumococcal polysaccharide (PPSV23) vaccine. One dose is recommended after age 54. Talk to your health care provider about which screenings and vaccines you need and how often you need them. This information is not intended to replace advice given to you by your health care provider. Make sure you discuss any questions you have with your health care provider. Document Released: 11/22/2015 Document Revised: 07/15/2016 Document Reviewed: 08/27/2015 Elsevier Interactive Patient Education  2017 [employer] Prevention in the Home Falls can cause injuries. They can happen to people of all ages. There are many things you can do to make your home safe and to help prevent falls. What can I do on the outside of my home? Regularly fix the edges of walkways and driveways and fix any cracks. Remove anything that might make you trip as you walk through a door, such as a raised step or threshold. Trim any bushes or trees on the path to your home. Use bright outdoor lighting. Clear any walking paths of anything that might make someone trip, such as rocks or tools. Regularly check to see if handrails are loose or broken. Make sure that both sides of any steps have handrails. Any raised decks and porches should have guardrails on the edges. Have any leaves, snow, or ice cleared regularly. Use sand or salt on walking paths during winter. Clean up any spills in your garage right away. This includes oil or grease spills. What can I do in the bathroom? Use night lights. Install grab bars by the toilet and in the tub and shower. Do not use towel bars as grab bars. Use non-skid mats or decals in the tub or shower. If you need to sit down in the shower, use a plastic, non-slip stool. Keep the floor dry. Clean up any water that spills on the floor as soon as it happens. Remove soap buildup in the tub or shower regularly. Attach bath mats  securely with double-sided non-slip rug tape. Do not have throw rugs and other things on the floor that can make you trip. What can I do in the bedroom? Use night lights. Make sure that you have a light by your bed that is easy to reach. Do not use any sheets or blankets that are too big for your bed. They should not hang down onto the floor. Have a firm chair that has side arms. You can use this for support while you get dressed. Do not have throw rugs and other things on the floor that can make you trip. What can I do in the kitchen? Clean up any spills right away. Avoid walking on wet floors. Keep items that you use a lot in easy-to-reach places. If you need to reach something above you, use a strong step stool that has a grab bar. Keep electrical cords out of the way. Do not use floor polish or wax that makes floors slippery. If you must use wax, use non-skid floor wax. Do not have throw rugs and other things on the floor that can make you trip. What can I do with my stairs? Do not leave any items on the stairs. Make sure that there are handrails on both sides of the stairs and use them. Fix handrails that are broken or loose. Make sure that handrails are as long as the stairways. Check any carpeting to make sure that it is firmly attached to  the stairs. Fix any carpet that is loose or worn. Avoid having throw rugs at the top or bottom of the stairs. If you do have throw rugs, attach them to the floor with carpet tape. Make sure that you have a light switch at the top of the stairs and the bottom of the stairs. If you do not have them, ask someone to add them for you. What else can I do to help prevent falls? Wear shoes that: Do not have high heels. Have rubber bottoms. Are comfortable and fit you well. Are closed at the toe. Do not wear sandals. If you use a stepladder: Make sure that it is fully opened. Do not climb a closed stepladder. Make sure that both sides of the stepladder  are locked into place. Ask someone to hold it for you, if possible. Clearly mark and make sure that you can see: Any grab bars or handrails. First and last steps. Where the edge of each step is. Use tools that help you move around (mobility aids) if they are needed. These include: Canes. Walkers. Scooters. Crutches. Turn on the lights when you go into a dark area. Replace any light bulbs as soon as they burn out. Set up your furniture so you have a clear path. Avoid moving your furniture around. If any of your floors are uneven, fix them. If there are any pets around you, be aware of where they are. Review your medicines with your doctor. Some medicines can make you feel dizzy. This can increase your chance of falling. Ask your doctor what other things that you can do to help prevent falls. This information is not intended to replace advice given to you by your health care provider. Make sure you discuss any questions you have with your health care provider. Document Released: 08/22/2009 Document Revised: 04/02/2016 Document Reviewed: 11/30/2014 Elsevier Interactive Patient Education  2017 [employer].

## 2021-10-17 NOTE — Progress Notes (Signed)
Subjective:   Tami [name] is a 65 y.o. female who presents for an Initial Medicare Annual Wellness Visit.  I connected with Devin today by telephone and verified that I am speaking with the correct person using two identifiers. Location patient: home Location provider: work Persons participating in the virtual visit: patient, Engineer, civil (consulting).    I discussed the limitations, risks, security and privacy concerns of performing an evaluation and management service by telephone and the availability of in person appointments. I also discussed with the patient that there may be a patient responsible charge related to this service. The patient expressed understanding and verbally consented to this telephonic visit.    Interactive audio and video telecommunications were attempted between this provider and patient, however failed, due to patient having technical difficulties OR patient did not have access to video capability.  We continued and completed visit with audio only.  Some vital signs may be absent or patient reported.   Time Spent with patient on telephone encounter: 25 minutes   Review of Systems     Cardiac Risk Factors include: advanced age (>89men, >72 women);hypertension;dyslipidemia;obesity (BMI >30kg/m2)     Objective:    Today's Vitals   10/17/21 1539  Weight: 193 lb (87.5 kg)  Height: 5\' 6"  (1.676 m)   Body mass index is 31.15 kg/m.  Advanced Directives 10/17/2021 09/18/2021 08/25/2021 08/08/2021 07/03/2021 04/03/2021 01/30/2021  Does Patient Have a Medical Advance Directive? Yes Yes Yes Yes Yes Yes Yes  Type of 02/01/2021 of Estate agent Power of [place];Living will Healthcare Power of [place];Living will Living will;Healthcare Power of Attorney Living will;Healthcare Power of Attorney Healthcare Power of Attorney Living will  Does patient want to make changes to medical advance directive? - No - Patient declined - No - Patient declined No -  Patient declined Yes (Inpatient - patient defers changing a medical advance directive at this time - Information given) No - Patient declined  Copy of Healthcare Power of Attorney in Chart? No - copy requested No - copy requested No - copy requested - - - -  Would patient like information on creating a medical advance directive? - No - Patient declined - - - - -    Current Medications (verified) Outpatient Encounter Medications as of 10/17/2021  Medication Sig   acetaminophen (TYLENOL) 500 MG tablet Take 500 mg by mouth every 6 (six) hours as needed.   acyclovir (ZOVIRAX) 400 MG tablet Take 400 mg by mouth 2 (two) times daily.   albuterol (VENTOLIN HFA) 108 (90 Base) MCG/ACT inhaler Inhale 2 puffs into the lungs every 6 (six) hours as needed for wheezing or shortness of breath.   aspirin EC 81 MG tablet Take 1 tablet (81 mg total) by mouth daily.   budesonide-formoterol (SYMBICORT) 160-4.5 MCG/ACT inhaler Inhale 2 puffs into the lungs 2 (two) times daily.   carvedilol (COREG) 25 MG tablet Take 1 tablet (25 mg total) by mouth 2 (two) times daily with a meal.   celecoxib (CELEBREX) 100 MG capsule Take 1 capsule (100 mg total) by mouth 2 (two) times daily as needed for mild pain or moderate pain (with food).   Cholecalciferol (VITAMIN D-3) 25 MCG (1000 UT) CAPS Take 1 capsule (1,000 Units total) by mouth daily.   cholestyramine (QUESTRAN) 4 g packet Take 1 packet by mouth 2 (two) times daily.   clotrimazole (GYNE-LOTRIMIN 3) 2 % vaginal cream Place 1 Applicatorful vaginally at bedtime.   cyclobenzaprine (FLEXERIL) 10 MG tablet Take  1 tablet (10 mg total) by mouth at bedtime.   dicyclomine (BENTYL) 10 MG capsule Take 1 tab every 6 hours as needed for abdominal cramping.   diphenhydrAMINE (BENADRYL) 25 mg capsule TAKE ONE (1) CAPSULE BY MOUTH THREE (3) TIMES EACH DAY AS NEEDED   Ferrous Fumarate-Folic Acid 579-0 MG TABS Take 324 mg by mouth daily.   fluticasone (FLONASE) 50 MCG/ACT nasal spray Place  2 sprays into both nostrils daily.   furosemide (LASIX) 20 MG tablet Take 1 tablet by mouth as directed.   HYDROcodone-acetaminophen (NORCO/VICODIN) 5-325 MG tablet Take 1 tablet by mouth every 6 (six) hours as needed.   hydrOXYzine (ATARAX/VISTARIL) 25 MG tablet Take 25 mg by mouth 3 (three) times daily.   ibuprofen (ADVIL) 800 MG tablet Take 1 tablet (800 mg total) by mouth 3 (three) times daily.   loratadine (CLARITIN) 10 MG tablet Take 1 tablet (10 mg total) by mouth daily.   losartan (COZAAR) 25 MG tablet Take 25 mg by mouth.   losartan (COZAAR) 50 MG tablet Take 75 mg by mouth daily.   magnesium oxide (MAG-OX) 400 MG tablet Take 1 tablet (400 mg total) by mouth 2 (two) times daily.   meclizine (ANTIVERT) 12.5 MG tablet Take 12.5 mg by mouth 3 (three) times daily.   metoCLOPramide (REGLAN) 10 MG tablet Take by mouth.   montelukast (SINGULAIR) 10 MG tablet Take 1 tablet (10 mg total) by mouth at bedtime.   Multiple Vitamins-Minerals (MULTIVITAMIN ADULT PO) Take 1 tablet by mouth daily.   omeprazole (PRILOSEC) 40 MG capsule Take 1 capsule (40 mg total) by mouth in the morning and at bedtime.   predniSONE (DELTASONE) 20 MG tablet Take 2 tablets (40 mg total) by mouth daily with breakfast for 5 days.   promethazine (PHENERGAN) 25 MG tablet Take 1 tablet (25 mg total) by mouth every 6 (six) hours as needed for nausea or vomiting.   sucralfate (CARAFATE) 1 g tablet Take 1 tablet (1 g total) by mouth 3 (three) times daily as needed. prn   tiZANidine (ZANAFLEX) 4 MG tablet Take 1 tablet (4 mg total) by mouth every 6 (six) hours as needed for muscle spasms.   topiramate (TOPAMAX) 50 MG tablet Take 1 tablet (50 mg total) by mouth 2 (two) times daily.   triamcinolone cream (KENALOG) 0.1 % Apply topically 2 (two) times daily.   zinc sulfate 220 (50 Zn) MG capsule Take 220 mg by mouth 2 (two) times daily.   zolpidem (AMBIEN) 5 MG tablet Take 1 tablet (5 mg total) by mouth at bedtime as needed for sleep.    [DISCONTINUED] prochlorperazine (COMPAZINE) 10 MG tablet Take 1 tablet (10 mg total) by mouth every 6 (six) hours as needed (Nausea or vomiting).   No facility-administered encounter medications on file as of 10/17/2021.    Allergies (verified) Benazepril, Ondansetron hcl, Codeine, and Morphine   History: Past Medical History:  Diagnosis Date   Abnormal SPEP 07/26/2016   Arthritis    knees, hands   Back pain 07/14/2016   Bowel obstruction ([place]) 11/2014   C. 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   Multiple myeloma ([place]) 07/20/2018   Multiple myeloma not having achieved remission ([place]) 07/20/2018   Myalgia 12/31/2016   Obstructive sleep apnea 03/31/2015   SOB (shortness of breath) 04/09/2016   Stroke ([place])    TIAs   UTI (urinary tract infection)    Past Surgical History:  Procedure Laterality Date   ABDOMINAL HYSTERECTOMY     menorraghia, 2006, total   CHOLECYSTECTOMY     COLONOSCOPY  2018   [facility] per patient   ESOPHAGOGASTRODUODENOSCOPY  2018   [facility]    KNEE SURGERY     right, repair torn torn cartialage   PACEMAKER INSERTION  08/2014   TONSILLECTOMY     TUBAL LIGATION     Family History  Problem Relation Age of Onset   Diabetes Mother    Hypertension Mother    Heart disease Mother        s/p 1 stent   Hyperlipidemia Mother    Arthritis Mother    Cancer Father        COLON   Colon cancer Father 75   Irritable bowel syndrome Sister    Hyperlipidemia Daughter    Hypertension Daughter    Hypertension Maternal Grandmother    Arthritis Maternal  Grandmother    Heart disease Maternal Grandfather        MI   Hypertension Maternal Grandfather    Arthritis Maternal Grandfather    Hypertension Son    Esophageal cancer Neg Hx    Social History   Socioeconomic History   Marital status: Divorced    Spouse name: [name]   Number of children: 3   Years of education: [name]   Highest education level: High school graduate  Occupational History   [name]  Tobacco Use   Smoking status: Never   Smokeless tobacco: Never  Vaping Use   Vaping Use: Never used  Substance and Sexual Activity   Alcohol use: No   Drug use: No   Sexual activity: [name]  Other Topics Concern   [name]  Social History Narrative   Lives at home alone   Retired   Caffeine: coffee   Social Determinants of Health   Financial Resource Strain: Low Risk    Difficulty of Paying Living Expenses: Not hard at all  Food Insecurity: No Food Insecurity   Worried About Charity fundraiser in the Last Year: Never true   Arboriculturist in the Last Year: Never true  Transportation Needs: No Transportation Needs   Lack of Transportation (Medical): No   Lack of Transportation (Non-Medical): No  Physical Activity: Insufficiently Active   Days of Exercise per Week: 2 days   Minutes of Exercise per Session: 30 min  Stress: No Stress Concern Present   Feeling of Stress : Not at all  Social Connections: Moderately Isolated   Frequency of Communication with Friends and Family: More than three times a week   Frequency of Social Gatherings with Friends and Family: More than three times a week   Attends Religious Services: More than 4 times per year   Active Member of [employer] or Organizations: No   Attends Archivist Meetings: Never   Marital Status: Divorced    Tobacco Counseling Counseling given: Not Answered   Clinical Intake:  Pre-visit preparation completed: Yes  Pain : No/denies pain     BMI - recorded:  31.15 Nutritional  Status: BMI > 30  Obese Nutritional Risks: None Diabetes: No  How often do you need to have someone help you when you read instructions, pamphlets, or other written materials from your doctor or pharmacy?: 1 - Never  Diabetic?No  Interpreter Needed?: No  Information entered by :: Thomasenia Sales LPN   Activities of Daily Living In your present state of health, do you have any difficulty performing the following activities: 10/17/2021 07/01/2021  Hearing? N N  Vision? N N  Difficulty concentrating or making decisions? N N  Walking or climbing stairs? N N  Dressing or bathing? N N  Doing errands, shopping? N N  Preparing Food and eating ? N -  Using the Toilet? N -  In the past six months, have you accidently leaked urine? Y -  Do you have problems with loss of bowel control? N -  Managing your Medications? N -  Managing your Finances? N -  Housekeeping or managing your Housekeeping? N -  Some recent data might be hidden    Patient Care Team: [name], MD as PCP - General (Family Medicine) Myna Hidalgo Rose Phi, MD as Consulting Physician (Oncology) Belva Chimes, MD as Referring Physician (Specialist)  Indicate any recent Medical Services you may have received from other than [facility] providers in the past year (date may be approximate).     Assessment:   This is a routine wellness examination for Liv.  Hearing/Vision screen Hearing Screening - Comments:: No issues Vision Screening - Comments:: Last eye exam-05/2021  Dietary issues and exercise activities discussed: Current Exercise Habits: Home exercise routine, Type of exercise: walking, Time (Minutes): 30, Frequency (Times/Week): 2, Weekly Exercise (Minutes/Week): 60, Intensity: Mild, Exercise limited by: orthopedic condition(s)   Goals Addressed             This Visit's Progress    Patient Stated       Start going to the gym, eat healthier & drink more water.       Depression Screen PHQ 2/9 Scores  10/17/2021 08/26/2021 07/30/2020 02/21/2015  PHQ - 2 Score 0 0 0 2  PHQ- 9 Score - - 1 -    Fall Risk Fall Risk  10/17/2021 08/26/2021 12/05/2020 07/30/2020 07/09/2017  Falls in the past year? 0 0 0 - No  Number falls in past yr: 0 0 0 0 -  Injury with Fall? 0 0 0 0 -  Risk for fall due to : - No Fall Risks - - -  Follow up Falls prevention discussed Falls evaluation completed - - -    FALL RISK PREVENTION PERTAINING TO THE HOME:  Any stairs in or around the home? Yes  If so, are there any without handrails? No  Home free of loose throw rugs in walkways, pet beds, electrical cords, etc? Yes  Adequate lighting in your home to reduce risk of falls? Yes   ASSISTIVE DEVICES UTILIZED TO PREVENT FALLS:  Life alert? No  Use of a cane, walker or w/c? No  Grab bars in the bathroom? No  Shower chair or bench in shower? No  Elevated toilet seat or a handicapped toilet? No   TIMED UP AND GO:  Was the test performed? No . Phone visit   Cognitive Function:Normal cognitive status assessed by this Nurse Health Advisor. No abnormalities found.          Immunizations Immunization History  Administered Date(s) Administered   Fluad Quad(high Dose 65+) 07/13/2019, 07/30/2020, 08/07/2021  Influenza Whole 09/09/2009, 09/12/2013, 07/12/2015   Influenza,inj,Quad PF,6+ Mos 07/14/2016, 07/27/2017, 07/07/2018   Influenza-Unspecified 08/23/2014, 07/06/2015, 07/14/2016, 07/27/2017   PFIZER(Purple Top)SARS-COV-2 Vaccination 12/15/2019, 01/05/2020, 11/15/2020, 03/17/2021   Pneumococcal Conjugate-13 07/12/2015, 06/02/2021   Pneumococcal Polysaccharide-23 09/06/2014, 07/12/2015   Pneumococcal-Unspecified 07/06/2015   Tdap 12/01/2010, 10/11/2015   Zoster, Live 10/11/2015, 09/15/2016    TDAP status: Up to date  Flu Vaccine status: Up to date  Pneumococcal vaccine status: Up to date  Covid-19 vaccine status: Information provided on how to obtain vaccines.   Qualifies for Shingles Vaccine? Yes    Zostavax completed Yes   Shingrix Completed?: No.    Education has been provided regarding the importance of this vaccine. Patient has been advised to call insurance company to determine out of pocket expense if they have not yet received this vaccine. Advised may also receive vaccine at local pharmacy or Health Dept. Verbalized acceptance and understanding.  Screening Tests Health Maintenance  Topic Date Due   Zoster Vaccines- Shingrix (1 of 2) Never done   COVID-19 Vaccine (5 - Booster for Pfizer series) 10/22/2021 (Originally 05/12/2021)   Pneumonia Vaccine 19+ Years old (67 - PPSV23 if available, else PCV20) 06/02/2022   MAMMOGRAM  03/29/2023   TETANUS/TDAP  10/10/2025   COLONOSCOPY (Pts 45-58yrs Insurance coverage will need to be confirmed)  10/19/2028   INFLUENZA VACCINE  Completed   DEXA SCAN  Completed   Hepatitis C Screening  Completed   HPV VACCINES  Aged Out    Health Maintenance  Health Maintenance Due  Topic Date Due   Zoster Vaccines- Shingrix (1 of 2) Never done    Colorectal cancer screening: Type of screening: Colonoscopy. Completed 10/19/2018. Repeat every 10 years  Mammogram status: Completed bilateral 03/28/2021. Repeat every year  Bone Density status: Ordered today. Pt provided with contact info and advised to call to schedule appt.  Lung Cancer Screening: (Low Dose CT Chest recommended if Age 70-80 years, 30 pack-year currently smoking OR have quit w/in 15years.) does not qualify.     Additional Screening:  Hepatitis C Screening: Completed 10/11/2015  Vision Screening: Recommended annual ophthalmology exams for early detection of glaucoma and other disorders of the eye. Is the patient up to date with their annual eye exam?  Yes  Who is the provider or what is the name of the office in which the patient attends annual eye exams? DR. Jamey Reas    Dental Screening: Recommended annual dental exams for proper oral hygiene  Community Resource Referral /  Chronic Care Management: CRR required this visit?  No   CCM required this visit?  No      Plan:     I have personally reviewed and noted the following in the patient's chart:   Medical and social history Use of alcohol, tobacco or illicit drugs  Current medications and supplements including opioid prescriptions. Patient is currently taking opioid prescriptions. Information provided to patient regarding non-opioid alternatives. Patient advised to discuss non-opioid treatment plan with their provider. Functional ability and status Nutritional status Physical activity Advanced directives List of other physicians Hospitalizations, surgeries, and ER visits in previous 12 months Vitals Screenings to include cognitive, depression, and falls Referrals and appointments  In addition, I have reviewed and discussed with patient certain preventive protocols, quality metrics, and best practice recommendations. A written personalized care plan for preventive services as well as general preventive health recommendations were provided to patient.   Due to this being a telephonic visit, the after visit summary with patients  personalized plan was offered to patient via mail or my-chart.  Per request, patient was mailed a copy of [place], LPN   [id number]/12/[id number]  Nurse Health Advisor  Nurse Notes: None

## 2021-10-18 DIAGNOSIS — H9201 Otalgia, right ear: Secondary | ICD-10-CM | POA: Diagnosis not present

## 2021-10-20 ENCOUNTER — Encounter (HOSPITAL_BASED_OUTPATIENT_CLINIC_OR_DEPARTMENT_OTHER): Payer: Self-pay

## 2021-10-20 ENCOUNTER — Other Ambulatory Visit: Payer: Self-pay

## 2021-10-20 ENCOUNTER — Emergency Department (HOSPITAL_BASED_OUTPATIENT_CLINIC_OR_DEPARTMENT_OTHER)
Admission: EM | Admit: 2021-10-20 | Discharge: 2021-10-20 | Disposition: A | Discharge status: Home / Self Care | Payer: Medicare Other | Attending: Emergency Medicine | Admitting: Emergency Medicine

## 2021-10-20 DIAGNOSIS — Z8616 Personal history of COVID-19: Secondary | ICD-10-CM | POA: Insufficient documentation

## 2021-10-20 DIAGNOSIS — K219 Gastro-esophageal reflux disease without esophagitis: Secondary | ICD-10-CM | POA: Diagnosis not present

## 2021-10-20 DIAGNOSIS — I5041 Acute combined systolic (congestive) and diastolic (congestive) heart failure: Secondary | ICD-10-CM | POA: Insufficient documentation

## 2021-10-20 DIAGNOSIS — R000 Tachycardia, unspecified: Secondary | ICD-10-CM | POA: Diagnosis not present

## 2021-10-20 DIAGNOSIS — R112 Nausea with vomiting, unspecified: Secondary | ICD-10-CM | POA: Diagnosis not present

## 2021-10-20 DIAGNOSIS — Z7982 Long term (current) use of aspirin: Secondary | ICD-10-CM | POA: Diagnosis not present

## 2021-10-20 DIAGNOSIS — R197 Diarrhea, unspecified: Secondary | ICD-10-CM | POA: Insufficient documentation

## 2021-10-20 DIAGNOSIS — Z79899 Other long term (current) drug therapy: Secondary | ICD-10-CM | POA: Insufficient documentation

## 2021-10-20 DIAGNOSIS — R1084 Generalized abdominal pain: Secondary | ICD-10-CM | POA: Diagnosis not present

## 2021-10-20 DIAGNOSIS — I110 Hypertensive heart disease with heart failure: Secondary | ICD-10-CM | POA: Diagnosis not present

## 2021-10-20 DIAGNOSIS — C9000 Multiple myeloma not having achieved remission: Secondary | ICD-10-CM | POA: Diagnosis not present

## 2021-10-20 LAB — COMPREHENSIVE METABOLIC PANEL WITH GFR
ALT: 17 U/L (ref 0–44)
AST: 25 U/L (ref 15–41)
Albumin: 3.4 g/dL — ABNORMAL LOW (ref 3.5–5.0)
Alkaline Phosphatase: 51 U/L (ref 38–126)
Anion gap: 9 (ref 5–15)
BUN: 21 mg/dL (ref 8–23)
CO2: 22 mmol/L (ref 22–32)
Calcium: 8.8 mg/dL — ABNORMAL LOW (ref 8.9–10.3)
Chloride: 107 mmol/L (ref 98–111)
Creatinine, Ser: 0.62 mg/dL (ref 0.44–1.00)
GFR, Estimated: >60 mL/min
Glucose, Bld: 134 mg/dL — ABNORMAL HIGH (ref 70–99)
Potassium: 3.6 mmol/L (ref 3.5–5.1)
Sodium: 138 mmol/L (ref 135–145)
Total Bilirubin: 0.2 mg/dL — ABNORMAL LOW (ref 0.3–1.2)
Total Protein: 8.2 g/dL — ABNORMAL HIGH (ref 6.5–8.1)

## 2021-10-20 LAB — CBC
HCT: 37 % (ref 36.0–46.0)
Hemoglobin: 12.1 g/dL (ref 12.0–15.0)
MCH: 29.2 pg (ref 26.0–34.0)
MCHC: 32.7 g/dL (ref 30.0–36.0)
MCV: 89.2 fL (ref 80.0–100.0)
Platelets: 120 10*3/uL — ABNORMAL LOW (ref 150–400)
RBC: 4.15 MIL/uL (ref 3.87–5.11)
RDW: 13.4 % (ref 11.5–15.5)
WBC: 3.3 10*3/uL — ABNORMAL LOW (ref 4.0–10.5)
nRBC: 0 % (ref 0.0–0.2)

## 2021-10-20 LAB — LIPASE, BLOOD: Lipase: 45 U/L (ref 11–51)

## 2021-10-20 MED ORDER — LACTATED RINGERS IV BOLUS
1000.0000 mL | Freq: Once | INTRAVENOUS | Status: AC
  Administered 2021-10-20: 1000 mL via INTRAVENOUS

## 2021-10-20 NOTE — ED Notes (Signed)
EKG being obtained by [place] NT at this time

## 2021-10-20 NOTE — ED Notes (Signed)
Pt. Up to restroom with non difficulty.

## 2021-10-20 NOTE — ED Triage Notes (Signed)
Pt arrives ambulatory to ED with c/o abdominal pain and diarrhea X2 weeks. Pt denies any blood in stool, some nausea, no vomiting. Pt was seen at cancer center last week and got IVF. Seen there for multiple myeloma, has not had a treatment for this in 9 months but stills gets regular blood work.

## 2021-10-20 NOTE — Discharge Instructions (Signed)
Follow-up with your doctors as needed.  The cardiologist should be able to pull up the labs through [facility].

## 2021-10-20 NOTE — ED Provider Notes (Signed)
[facility] Provider Note   CSN: [id number] Arrival date & time: 10/20/21  1824     History Chief Complaint  Patient presents with   Abdominal Pain    Tami [name] is a 65 y.o. female.   Abdominal Pain Associated symptoms: diarrhea, nausea and vomiting   Associated symptoms: no chest pain and no shortness of breath   Patient presents with abdominal pain and diarrhea.  Has had for a couple months now.  Has been seen in the ER and by GI.  Has had an upper GI without a clear cause.  Scheduled to get colonoscopy.  States she continues to have symptoms.  Has loose bowel movements about 7-9 times a day.  No blood.  States when she tries to eat it will come up later.  States she got fluid at her oncologist.  Not currently under treatment for her multiple myeloma.  Crampy abdominal pain.  Had previously been C. difficile positive but recently checked a GI and was negative    Past Medical History:  Diagnosis Date   Abnormal SPEP 07/26/2016   Arthritis    knees, hands   Back pain 07/14/2016   Bowel obstruction ([place]) 11/2014   C. 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   Multiple myeloma ([place]) 07/20/2018   Multiple myeloma not having achieved remission ([place]) 07/20/2018   Myalgia 12/31/2016   Obstructive sleep apnea 03/31/2015   SOB (shortness of breath) 04/09/2016   Stroke ([place])    TIAs   UTI (urinary tract infection)     Patient Active Problem List   Diagnosis Date Noted   Dehydration 09/16/2021   Ear fullness, bilateral 08/12/2021   Bilateral knee pain 08/08/2021    History of 2019 novel coronavirus disease (COVID-19) 12/06/2020   Low vitamin D level 07/31/2020   Acute combined systolic and diastolic heart failure ([place]) 05/09/2020   Low magnesium level 03/04/2020   Thrombocytopenia ([place]) 03/04/2020   Dysphagia 03/04/2020   Constipation 03/04/2020   Sensation of pressure in bladder area 01/17/2020   Hyperglycemia 01/17/2020   Peripheral neuropathy 11/15/2019   Chronic migraine without aura, with intractable migraine, so stated, with status migrainosus 10/17/2019   ETD (Eustachian tube dysfunction), bilateral 09/12/2019   Urinary frequency 07/17/2019   Headache 07/17/2019   Palpitation 05/15/2019   Diarrhea 09/04/2018   Thyroid nodule 08/26/2018   Multiple myeloma ([place]) 07/20/2018   Multiple myeloma not having achieved remission ([place]) 07/20/2018   Hematuria 07/11/2018   Seasonal allergies 06/23/2018   Right shoulder pain 06/07/2018   Neck pain 06/07/2018   Paresthesias 06/07/2018   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   Pain in thoracic spine 03/18/2018   Thoracic back pain 02/15/2018   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   Cystitis 10/25/2017   Hypokalemia 08/20/2017   Anemia 08/20/2017   Elevated sed rate 08/15/2017   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   Epigastric pain 03/22/2017   Myalgia 12/31/2016   SOB (shortness of breath) 07/26/2016   Abnormal SPEP 07/26/2016   Low back pain  07/14/2016   Insomnia 12/29/2015   Tension headache 10/21/2015   Muscle spasms of neck 10/18/2015   Dysuria 10/13/2015   Sinusitis 07/07/2015   AP (abdominal pain) 07/07/2015   Cardiomyopathy ([place]) 04/19/2015   Chest wall pain 04/19/2015   Obstructive sleep apnea 03/31/2015   Congestive heart failure ([place]) 02/24/2015   Arthritis    Hyperlipidemia    Hypertension    Depression    Stroke [facility])    GERD (gastroesophageal reflux disease)    Bowel obstruction ([place]) 11/09/2014    Past Surgical History:  Procedure  Laterality Date   ABDOMINAL HYSTERECTOMY     menorraghia, 2006, total   CHOLECYSTECTOMY     COLONOSCOPY  2018   [facility] per patient   ESOPHAGOGASTRODUODENOSCOPY  2018   [place]     right, repair torn torn cartialage   PACEMAKER INSERTION  08/2014   TONSILLECTOMY     TUBAL LIGATION       OB History   No obstetric history on file.     Family History  Problem Relation Age of Onset   Diabetes Mother    Hypertension Mother    Heart disease Mother        s/p 1 stent   Hyperlipidemia Mother    Arthritis Mother    Cancer Father        COLON   Colon cancer Father 63   Irritable bowel syndrome Sister    Hyperlipidemia Daughter    Hypertension Daughter    Hypertension Maternal Grandmother    Arthritis Maternal Grandmother    Heart disease Maternal Grandfather        MI   Hypertension Maternal Grandfather    Arthritis Maternal Grandfather    Hypertension Son    Esophageal cancer Neg Hx     Social History   Tobacco Use   Smoking status: Never   Smokeless tobacco: Never  Vaping Use   Vaping Use: Never used  Substance Use Topics   Alcohol use: No   Drug use: No    Home Medications Prior to Admission medications   Medication Sig Start Date End Date Taking? Authorizing Provider  acetaminophen (TYLENOL) 500 MG tablet Take 500 mg by mouth every 6 (six) hours as needed.    [provider]  acyclovir (ZOVIRAX) 400 MG tablet Take 400 mg by mouth 2 (two) times daily. 06/26/21   [provider]  albuterol (VENTOLIN HFA) 108 (90 Base) MCG/ACT inhaler Inhale 2 puffs into the lungs every 6 (six) hours as needed for wheezing or shortness of breath. 09/30/20   Mosie Lukes, MD  aspirin EC 81 MG tablet Take 1 tablet (81 mg total) by mouth daily. 02/15/18   Mosie Lukes, MD  budesonide-formoterol [facility]) 160-4.5 MCG/ACT inhaler Inhale 2 puffs into the lungs 2 (two) times daily. 10/01/20   Mosie Lukes, MD   carvedilol (COREG) 25 MG tablet Take 1 tablet (25 mg total) by mouth 2 (two) times daily with a meal. 06/04/20   Mosie Lukes, MD  celecoxib (CELEBREX) 100 MG capsule Take 1 capsule (100 mg total) by mouth 2 (two) times daily as needed for mild pain or moderate pain (with food). 08/07/21   Mosie Lukes, MD  Cholecalciferol (VITAMIN D-3) 25 MCG (1000 UT) CAPS Take 1 capsule (1,000 Units total) by mouth daily. 05/03/20   Mosie Lukes, MD  cholestyramine Lucrezia Starch) 4 g packet Take 1 packet by mouth 2 (two) times  daily. 06/16/21   [provider]  clotrimazole (GYNE-LOTRIMIN 3) 2 % vaginal cream Place 1 Applicatorful vaginally at bedtime. 06/18/21   Colon Branch, MD  cyclobenzaprine (FLEXERIL) 10 MG tablet Take 1 tablet (10 mg total) by mouth at bedtime. 02/19/20   Saguier, Percell Miller, PA-C  dicyclomine (BENTYL) 10 MG capsule Take 1 tab every 6 hours as needed for abdominal cramping. 05/28/21   Shelda Pal, DO  diphenhydrAMINE (BENADRYL) 25 mg capsule TAKE ONE (1) CAPSULE BY MOUTH THREE (3) TIMES EACH DAY AS NEEDED 03/11/21   [provider]  Ferrous Fumarate-Folic Acid 841-3 MG TABS Take 324 mg by mouth daily. 08/29/21   Mosie Lukes, MD  fluticasone (FLONASE) 50 MCG/ACT nasal spray Place 2 sprays into both nostrils daily. 01/07/20   Joy, Shawn C, PA-C  furosemide (LASIX) 20 MG tablet Take 1 tablet by mouth as directed. 07/03/20   [provider]  HYDROcodone-acetaminophen (NORCO/VICODIN) 5-325 MG tablet Take 1 tablet by mouth every 6 (six) hours as needed. 08/25/21   Suzy Bouchard, PA-C  hydrOXYzine (ATARAX/VISTARIL) 25 MG tablet Take 25 mg by mouth 3 (three) times daily. 04/25/21   [provider]  ibuprofen (ADVIL) 800 MG tablet Take 1 tablet (800 mg total) by mouth 3 (three) times daily. 08/25/21   Suzy Bouchard, PA-C  loratadine (CLARITIN) 10 MG tablet Take 1 tablet (10 mg total) by mouth daily. 09/08/21   Shelda Pal, DO  losartan  (COZAAR) 25 MG tablet Take 25 mg by mouth. 05/06/20   [provider]  losartan (COZAAR) 50 MG tablet Take 75 mg by mouth daily. 04/14/21   [provider]  magnesium oxide (MAG-OX) 400 MG tablet Take 1 tablet (400 mg total) by mouth 2 (two) times daily. 03/04/20   Mosie Lukes, MD  meclizine (ANTIVERT) 12.5 MG tablet Take 12.5 mg by mouth 3 (three) times daily. 02/18/20   [provider]  metoCLOPramide (REGLAN) 10 MG tablet Take by mouth. 10/17/21   [provider]  montelukast (SINGULAIR) 10 MG tablet Take 1 tablet (10 mg total) by mouth at bedtime. 02/14/21   Debbrah Alar, NP  Multiple Vitamins-Minerals (MULTIVITAMIN ADULT PO) Take 1 tablet by mouth daily.    [provider]  omeprazole (PRILOSEC) 40 MG capsule Take 1 capsule (40 mg total) by mouth in the morning and at bedtime. 07/25/21   Shelda Pal, DO  promethazine (PHENERGAN) 25 MG tablet Take 1 tablet (25 mg total) by mouth every 6 (six) hours as needed for nausea or vomiting. 07/25/21   Nani Ravens, Crosby Oyster, DO  sucralfate (CARAFATE) 1 g tablet Take 1 tablet (1 g total) by mouth 3 (three) times daily as needed. prn 07/30/20   Mosie Lukes, MD  tiZANidine (ZANAFLEX) 4 MG tablet Take 1 tablet (4 mg total) by mouth every 6 (six) hours as needed for muscle spasms. 10/13/21   Shelda Pal, DO  topiramate (TOPAMAX) 50 MG tablet Take 1 tablet (50 mg total) by mouth 2 (two) times daily. 07/13/19   Mosie Lukes, MD  triamcinolone cream (KENALOG) 0.1 % Apply topically 2 (two) times daily. 03/11/21   [provider]  zinc sulfate 220 (50 Zn) MG capsule Take 220 mg by mouth 2 (two) times daily. 06/16/21   [provider]  zolpidem (AMBIEN) 5 MG tablet Take 1 tablet (5 mg total) by mouth at bedtime as needed for sleep. 05/08/21   Mosie Lukes, MD  prochlorperazine (  COMPAZINE) 10 MG tablet Take 1 tablet (10 mg total) by mouth every 6 (six) hours as needed (Nausea or  vomiting). 09/04/20 12/19/20  [name], MD    Allergies    Benazepril, Ondansetron hcl, Codeine, and Morphine  Review of Systems   Review of Systems  HENT:  Negative for congestion.   Respiratory:  Negative for shortness of breath.   Cardiovascular:  Negative for chest pain.  Gastrointestinal:  Positive for abdominal pain, diarrhea, nausea and vomiting.  Genitourinary:  Negative for dyspareunia.  Musculoskeletal:  Negative for back pain.  Skin:  Negative for rash.  Neurological:  Negative for weakness.  Psychiatric/Behavioral:  Negative for confusion.    Physical Exam Updated Vital Signs BP (!) 159/104   Pulse 92   Temp 97.9 F (36.6 C) (Oral)   Resp 14   Ht _0  (1.676 m)   Wt 88.9 kg   SpO2 98%   BMI 31.64 kg/m   Physical Exam Vitals and nursing note reviewed.  HENT:     Head: Atraumatic.  Cardiovascular:     Rate and Rhythm: Regular rhythm. Tachycardia present.  Pulmonary:     Breath sounds: Normal breath sounds.  Abdominal:     Hernia: No hernia is present.     Comments: Mild abdominal tenderness without rebound or guarding.  No hernia palpated.  Skin:    General: Skin is warm.     Capillary Refill: Capillary refill takes less than 2 seconds.  Neurological:     Mental Status: She is alert and oriented to person, place, and time.    ED Results / Procedures / Treatments   Labs (all labs ordered are listed, but only abnormal results are displayed) Labs Reviewed  COMPREHENSIVE METABOLIC PANEL - Abnormal; Notable for the following components:      Result Value   Glucose, Bld 134 (*)    Calcium 8.8 (*)    Total Protein 8.2 (*)    Albumin 3.4 (*)    Total Bilirubin 0.2 (*)    All other components within normal limits  CBC - Abnormal; Notable for the following components:   WBC 3.3 (*)    Platelets 120 (*)    All other components within normal limits  LIPASE, BLOOD  URINALYSIS, ROUTINE W REFLEX MICROSCOPIC    EKG None  Radiology No results  found.  Procedures Procedures   Medications Ordered in ED Medications  lactated ringers bolus 1,000 mL (0 mLs Intravenous Stopped 10/20/21 2259)    ED Course  I have reviewed the triage vital signs and the nursing notes.  Pertinent labs & imaging results that were available during my care of the patient were reviewed by me and considered in my medical decision making (see chart for details).    MDM Rules/Calculators/A&P                           Patient has abdominal pain diarrhea.  Is had for the last couple months.  Has seen GI in the ER for it previous bleeding.  Has had upper GI.  Due to have colonoscopy.  Lab work overall reassuring.  Has had a recent hypokalemia but that is improved.  Has an initial tachycardia.  Fluids given.  Discussed with patient about oral intake to keep up with what may be coming out.  However do not feel she needs further imaging at this time.  Tolerating orals well and will discharge home. Final Clinical  Impression(s) / ED Diagnoses Final diagnoses:  Generalized abdominal pain  Diarrhea, unspecified type    Rx / DC Orders ED Discharge Orders     None        Davonna Belling, MD 10/20/21 2316

## 2021-10-21 DIAGNOSIS — Z45018 Encounter for adjustment and management of other part of cardiac pacemaker: Secondary | ICD-10-CM | POA: Diagnosis not present

## 2021-10-21 DIAGNOSIS — I5042 Chronic combined systolic (congestive) and diastolic (congestive) heart failure: Secondary | ICD-10-CM | POA: Diagnosis not present

## 2021-10-22 ENCOUNTER — Telehealth: Payer: Self-pay | Admitting: Gastroenterology

## 2021-10-22 NOTE — Telephone Encounter (Signed)
Good Morning Dr. Lyndel Safe,   Patient called this morning and wanted to make an appointment with you. She has a referral from July for abd pain and GERD.    She was last seen on 12/8 by WF.   Can ou advise on scheduling?  Thank you.

## 2021-10-23 ENCOUNTER — Other Ambulatory Visit: Payer: Self-pay | Admitting: Family Medicine

## 2021-10-23 ENCOUNTER — Telehealth: Payer: Self-pay | Admitting: Family Medicine

## 2021-10-23 ENCOUNTER — Encounter: Payer: Self-pay | Admitting: Gastroenterology

## 2021-10-23 DIAGNOSIS — R109 Unspecified abdominal pain: Secondary | ICD-10-CM

## 2021-10-23 DIAGNOSIS — K2100 Gastro-esophageal reflux disease with esophagitis, without bleeding: Secondary | ICD-10-CM

## 2021-10-23 DIAGNOSIS — R197 Diarrhea, unspecified: Secondary | ICD-10-CM

## 2021-10-23 NOTE — Telephone Encounter (Signed)
No problems Can make appointment-first available (Jan) RG

## 2021-10-23 NOTE — Telephone Encounter (Signed)
Pt called and wanted to see if she could get a referral to [facility] for GI. She stated she has been in and out of the ER for stomach pain and diarrhea. She wanted to see can her information be pulled up so you can see her visit to the ER. She said that she tried to contact Dr. Lyndel Safe office but she has been getting the run around.

## 2021-10-23 NOTE — Telephone Encounter (Signed)
Thank you Dr. Lyndel Safe,  Patient was scheduled with you on 1/27 at 10:00.

## 2021-10-27 ENCOUNTER — Telehealth: Payer: Self-pay | Admitting: Family Medicine

## 2021-10-27 DIAGNOSIS — G43909 Migraine, unspecified, not intractable, without status migrainosus: Secondary | ICD-10-CM | POA: Diagnosis not present

## 2021-10-27 NOTE — Telephone Encounter (Signed)
Pt called and stated that she is having a severe headache and stiffness in her neck. Transferred to triage for further advice

## 2021-10-30 ENCOUNTER — Ambulatory Visit (INDEPENDENT_AMBULATORY_CARE_PROVIDER_SITE_OTHER): Payer: Medicare Other | Admitting: Family

## 2021-10-30 ENCOUNTER — Encounter: Payer: Self-pay | Admitting: Family

## 2021-10-30 VITALS — BP 124/82 | HR 85 | Temp 98.0°F | Ht 66.0 in | Wt 196.4 lb

## 2021-10-30 DIAGNOSIS — G43711 Chronic migraine without aura, intractable, with status migrainosus: Secondary | ICD-10-CM

## 2021-10-30 DIAGNOSIS — R519 Headache, unspecified: Secondary | ICD-10-CM | POA: Diagnosis not present

## 2021-10-30 MED ORDER — AZITHROMYCIN 250 MG PO TABS: ORAL_TABLET | ORAL | 0 refills | Status: DC

## 2021-10-30 MED ORDER — PREDNISONE 20 MG PO TABS: 20.0000 mg | ORAL_TABLET | Freq: Every day | ORAL | 0 refills | Status: DC

## 2021-10-30 NOTE — Progress Notes (Signed)
Tami [name] is a 65 y.o. female with the following history as recorded in EpicCare:  Patient Active Problem List   Diagnosis Date Noted   Dehydration 09/16/2021   Ear fullness, bilateral 08/12/2021   Bilateral knee pain 08/08/2021   History of 2019 novel coronavirus disease (COVID-19) 12/06/2020   Low vitamin D level 07/31/2020   Acute combined systolic and diastolic heart failure ([place]) 05/09/2020   Low magnesium level 03/04/2020   Thrombocytopenia ([place]) 03/04/2020   Dysphagia 03/04/2020   Constipation 03/04/2020   Sensation of pressure in bladder area 01/17/2020   Hyperglycemia 01/17/2020   Peripheral neuropathy 11/15/2019   Chronic migraine without aura, with intractable migraine, so stated, with status migrainosus 10/17/2019   ETD (Eustachian tube dysfunction), bilateral 09/12/2019   Urinary frequency 07/17/2019   Headache 07/17/2019   Palpitation 05/15/2019   Diarrhea 09/04/2018   Thyroid nodule 08/26/2018   Multiple myeloma ([place]) 07/20/2018   Multiple myeloma not having achieved remission ([place]) 07/20/2018   Hematuria 07/11/2018   Seasonal allergies 06/23/2018   Right shoulder pain 06/07/2018   Neck pain 06/07/2018   Paresthesias 06/07/2018   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   Pain in thoracic spine 03/18/2018   Thoracic back pain 02/15/2018   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   Cystitis 10/25/2017   Hypokalemia 08/20/2017   Anemia 08/20/2017   Elevated sed rate 08/15/2017   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   Epigastric pain 03/22/2017   Myalgia 12/31/2016   SOB (shortness of breath) 07/26/2016   Abnormal SPEP 07/26/2016   Low back pain 07/14/2016   Insomnia 12/29/2015   Tension headache 10/21/2015   Muscle spasms of neck 10/18/2015   Dysuria 10/13/2015   Sinusitis 07/07/2015   AP (abdominal pain) 07/07/2015   Cardiomyopathy ([place]) 04/19/2015   Chest wall pain 04/19/2015   Obstructive sleep apnea 03/31/2015    Congestive heart failure ([place]) 02/24/2015   Arthritis    Hyperlipidemia    Hypertension    Depression    Stroke [facility])    GERD (gastroesophageal reflux disease)    Bowel obstruction ([place]) 11/09/2014    Current Outpatient Medications  Medication Sig Dispense Refill   acetaminophen (TYLENOL) 500 MG tablet Take 500 mg by mouth every 6 (six) hours as needed.     acyclovir (ZOVIRAX) 400 MG tablet Take 400 mg by mouth 2 (two) times daily.     albuterol (VENTOLIN HFA) 108 (90 Base) MCG/ACT inhaler Inhale 2 puffs into the lungs every 6 (six) hours as needed for wheezing or shortness of breath. 18 g 5   aspirin EC 81 MG tablet Take 1 tablet (81 mg total) by mouth daily. 90 tablet 1   azithromycin (ZITHROMAX) 250 MG tablet 2 tabs po qd x 1 day; 1 tablet per day x 4 days; 6 tablet 0   budesonide-formoterol (SYMBICORT) 160-4.5 MCG/ACT inhaler Inhale 2 puffs into the lungs 2 (two) times daily. 1 each 3   carvedilol (COREG) 25 MG tablet Take 1 tablet (25 mg total) by mouth 2 (two) times daily with a meal. 180 tablet 1   celecoxib (CELEBREX) 100 MG capsule Take 1 capsule (100 mg total) by mouth 2 (two) times daily as needed for mild pain or moderate pain (with food). 60 capsule 3   Cholecalciferol (VITAMIN D-3) 25 MCG (1000 UT) CAPS Take 1 capsule (1,000 Units total) by mouth daily. 60 capsule 2   dicyclomine (BENTYL) 10 MG capsule Take 1 tab every 6 hours as needed  for abdominal cramping. 60 capsule 0   diphenhydrAMINE (BENADRYL) 25 mg capsule TAKE ONE (1) CAPSULE BY MOUTH THREE (3) TIMES EACH DAY AS NEEDED     fluticasone (FLONASE) 50 MCG/ACT nasal spray Place 2 sprays into both nostrils daily. 16 g 0   furosemide (LASIX) 20 MG tablet Take 1 tablet by mouth as directed.     HYDROcodone-acetaminophen (NORCO/VICODIN) 5-325 MG tablet Take 1 tablet by mouth every 6 (six) hours as needed. 6 tablet 0   ibuprofen (ADVIL) 800 MG tablet Take 1 tablet (800 mg total) by mouth 3 (three) times daily. 21 tablet 0    loratadine (CLARITIN) 10 MG tablet Take 1 tablet (10 mg total) by mouth daily. 30 tablet 11   losartan (COZAAR) 50 MG tablet Take 75 mg by mouth daily.     magnesium oxide (MAG-OX) 400 MG tablet Take 1 tablet (400 mg total) by mouth 2 (two) times daily. 60 tablet 1   meclizine (ANTIVERT) 12.5 MG tablet Take 12.5 mg by mouth 3 (three) times daily.     Multiple Vitamins-Minerals (MULTIVITAMIN ADULT PO) Take 1 tablet by mouth daily.     omeprazole (PRILOSEC) 40 MG capsule Take 1 capsule (40 mg total) by mouth in the morning and at bedtime. 60 capsule 2   predniSONE (DELTASONE) 20 MG tablet Take 1 tablet (20 mg total) by mouth daily with breakfast. 5 tablet 0   promethazine (PHENERGAN) 25 MG tablet Take 1 tablet (25 mg total) by mouth every 6 (six) hours as needed for nausea or vomiting. 30 tablet 0   sucralfate (CARAFATE) 1 g tablet Take 1 tablet (1 g total) by mouth 3 (three) times daily as needed. prn 90 tablet 1   tiZANidine (ZANAFLEX) 4 MG tablet Take 1 tablet (4 mg total) by mouth every 6 (six) hours as needed for muscle spasms. 30 tablet 0   topiramate (TOPAMAX) 50 MG tablet Take 1 tablet (50 mg total) by mouth 2 (two) times daily. 60 tablet 3   triamcinolone cream (KENALOG) 0.1 % Apply topically 2 (two) times daily.     zinc sulfate 220 (50 Zn) MG capsule Take 220 mg by mouth 2 (two) times daily.     zolpidem (AMBIEN) 5 MG tablet Take 1 tablet (5 mg total) by mouth at bedtime as needed for sleep. 15 tablet 1   cholestyramine (QUESTRAN) 4 g packet Take 1 packet by mouth 2 (two) times daily. (Patient not taking: Reported on 10/30/2021)     clotrimazole (GYNE-LOTRIMIN 3) 2 % vaginal cream Place 1 Applicatorful vaginally at bedtime. (Patient not taking: Reported on 10/30/2021) 21 g 0   cyclobenzaprine (FLEXERIL) 10 MG tablet Take 1 tablet (10 mg total) by mouth at bedtime. (Patient not taking: Reported on 10/30/2021) 7 tablet 0   Ferrous Fumarate-Folic Acid 829-9 MG TABS Take 324 mg by mouth daily.  (Patient not taking: Reported on 10/30/2021) 90 tablet 1   hydrOXYzine (ATARAX/VISTARIL) 25 MG tablet Take 25 mg by mouth 3 (three) times daily. (Patient not taking: Reported on 10/30/2021)     losartan (COZAAR) 25 MG tablet Take 25 mg by mouth. (Patient not taking: Reported on 10/30/2021)     metoCLOPramide (REGLAN) 10 MG tablet Take by mouth.     montelukast (SINGULAIR) 10 MG tablet Take 1 tablet (10 mg total) by mouth at bedtime. (Patient not taking: Reported on 10/30/2021) 30 tablet 3   No current facility-administered medications for this visit.    Allergies: Benazepril, Ondansetron hcl, Codeine, and  Morphine  Past Medical History:  Diagnosis Date   Abnormal SPEP 07/26/2016   Arthritis    knees, hands   Back pain 07/14/2016   Bowel obstruction ([place]) 11/2014   C. 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/2016   Multiple myeloma ([place]) 07/20/2018   Multiple myeloma not having achieved remission ([place]) 07/20/2018   Myalgia 12/31/2016   Obstructive sleep apnea 03/31/2015   SOB (shortness of breath) 04/09/2016   Stroke ([place])    TIAs   UTI (urinary tract infection)     Past Surgical History:  Procedure Laterality Date   ABDOMINAL HYSTERECTOMY     menorraghia, 2006, total   CHOLECYSTECTOMY     COLONOSCOPY  2018   [facility] per patient   ESOPHAGOGASTRODUODENOSCOPY  2018   [facility]    KNEE SURGERY     right, repair torn torn cartialage   PACEMAKER INSERTION  08/2014   TONSILLECTOMY     TUBAL LIGATION      Family History  Problem Relation Age of Onset   Diabetes Mother     Hypertension Mother    Heart disease Mother        s/p 1 stent   Hyperlipidemia Mother    Arthritis Mother    Cancer Father        COLON   Colon cancer Father 44   Irritable bowel syndrome Sister    Hyperlipidemia Daughter    Hypertension Daughter    Hypertension Maternal Grandmother    Arthritis Maternal Grandmother    Heart disease Maternal Grandfather        MI   Hypertension Maternal Grandfather    Arthritis Maternal Grandfather    Hypertension Son    Esophageal cancer Neg Hx     Social History   Tobacco Use   Smoking status: Never   Smokeless tobacco: Never  Substance Use Topics   Alcohol use: No    Subjective:  Patient presents with concerns for migraine headache; notes she has not felt well since the beginning of December; seen here with symptom of right ear pain on 12/5; ER visit for abdominal pain here on 12/8 and again on 12/12; labs and imaging done at ER were unremarkable; does have a history of migraine headaches; + sinus pain/ pressure;  Is scheduled for colonoscopy in early January;     Objective:  Vitals:   10/30/21 0816  BP: 124/82  Pulse: 85  Temp: 98 F (36.7 C)  TempSrc: Oral  SpO2: 98%  Weight: 196 lb 6.4 oz (89.1 kg)  Height: 5' 6" (1.676 m)    General: Well developed, well nourished, in no acute distress  Skin : Warm and dry.  Head: Normocephalic and atraumatic  Eyes: Sclera and conjunctiva clear; pupils round and reactive to light; extraocular movements intact  Ears: External normal; canals clear; tympanic membranes normal  Oropharynx: Pink, supple. No suspicious lesions  Neck: Supple without thyromegaly, adenopathy  Lungs: Respirations unlabored; clear to auscultation bilaterally without wheeze, rales, rhonchi  Vessels: Symmetric bilaterally  Neurologic: Alert and oriented; speech intact; face  symmetrical; moves all extremities well; CNII-XII intact without focal deficit   Assessment:  1. Chronic migraine without aura, with  intractable migraine, so stated, with status migrainosus   2. Nonintractable headache, unspecified chronicity pattern, unspecified headache type     Plan:  ? Underlying sinus infection; physical exam is unremarkable and has labs twice in the past 2 weeks; will treat with Zithromax ( due to history of C. Diff) and Prednisone; if symptoms persist, to consider head CT; increase fluids, rest and follow up worse, no better.   Time spent with patient 30 minutes reviewing symptoms/ previous records and understanding history  This visit occurred during the SARS-CoV-2 public health emergency.  Safety protocols were in place, including screening questions prior to the visit, additional usage of staff PPE, and extensive cleaning of exam room while observing appropriate contact time as indicated for disinfecting solutions.    No follow-ups on file.  No orders of the defined types were placed in this encounter.   Requested Prescriptions   Signed Prescriptions Disp Refills   predniSONE (DELTASONE) 20 MG tablet 5 tablet 0    Sig: Take 1 tablet (20 mg total) by mouth daily with breakfast.   azithromycin (ZITHROMAX) 250 MG tablet 6 tablet 0    Sig: 2 tabs po qd x 1 day; 1 tablet per day x 4 days;

## 2021-11-04 ENCOUNTER — Other Ambulatory Visit: Payer: Self-pay

## 2021-11-04 ENCOUNTER — Telehealth: Payer: Self-pay

## 2021-11-04 ENCOUNTER — Emergency Department (HOSPITAL_BASED_OUTPATIENT_CLINIC_OR_DEPARTMENT_OTHER)
Admission: EM | Admit: 2021-11-04 | Discharge: 2021-11-04 | Disposition: A | Discharge status: Home / Self Care | Payer: Medicare Other | Attending: Emergency Medicine | Admitting: Emergency Medicine

## 2021-11-04 ENCOUNTER — Encounter (HOSPITAL_BASED_OUTPATIENT_CLINIC_OR_DEPARTMENT_OTHER): Payer: Self-pay | Admitting: *Deleted

## 2021-11-04 DIAGNOSIS — R197 Diarrhea, unspecified: Secondary | ICD-10-CM | POA: Insufficient documentation

## 2021-11-04 DIAGNOSIS — Z79899 Other long term (current) drug therapy: Secondary | ICD-10-CM | POA: Diagnosis not present

## 2021-11-04 DIAGNOSIS — Z7982 Long term (current) use of aspirin: Secondary | ICD-10-CM | POA: Insufficient documentation

## 2021-11-04 DIAGNOSIS — Z950 Presence of cardiac pacemaker: Secondary | ICD-10-CM | POA: Insufficient documentation

## 2021-11-04 DIAGNOSIS — Z8616 Personal history of COVID-19: Secondary | ICD-10-CM | POA: Diagnosis not present

## 2021-11-04 DIAGNOSIS — I509 Heart failure, unspecified: Secondary | ICD-10-CM | POA: Insufficient documentation

## 2021-11-04 DIAGNOSIS — I110 Hypertensive heart disease with heart failure: Secondary | ICD-10-CM | POA: Insufficient documentation

## 2021-11-04 LAB — COMPREHENSIVE METABOLIC PANEL
ALT: 16 U/L (ref 0–44)
AST: 25 U/L (ref 15–41)
Albumin: 3.5 g/dL (ref 3.5–5.0)
Alkaline Phosphatase: 43 U/L (ref 38–126)
Anion gap: 8 (ref 5–15)
BUN: 16 mg/dL (ref 8–23)
CO2: 22 mmol/L (ref 22–32)
Calcium: 9 mg/dL (ref 8.9–10.3)
Chloride: 108 mmol/L (ref 98–111)
Creatinine, Ser: 0.83 mg/dL (ref 0.44–1.00)
GFR, Estimated: >60 mL/min (ref >60)
Glucose, Bld: 109 mg/dL — ABNORMAL HIGH (ref 70–99)
Potassium: 4.1 mmol/L (ref 3.5–5.1)
Sodium: 138 mmol/L (ref 135–145)
Total Bilirubin: 0.4 mg/dL (ref 0.3–1.2)
Total Protein: 8.4 g/dL — ABNORMAL HIGH (ref 6.5–8.1)

## 2021-11-04 LAB — URINALYSIS, ROUTINE W REFLEX MICROSCOPIC
Bilirubin Urine: NEGATIVE
Glucose, UA: NEGATIVE mg/dL
Hgb urine dipstick: NEGATIVE
Ketones, ur: NEGATIVE mg/dL
Leukocytes,Ua: NEGATIVE
Nitrite: NEGATIVE
Protein, ur: NEGATIVE mg/dL
Specific Gravity, Urine: 1.025 (ref 1.005–1.030)
pH: 5.5 (ref 5.0–8.0)

## 2021-11-04 LAB — CBC WITH DIFFERENTIAL/PLATELET
Abs Immature Granulocytes: 0 10*3/uL (ref 0.00–0.07)
Basophils Absolute: 0 10*3/uL (ref 0.0–0.1)
Basophils Relative: 1 %
Eosinophils Absolute: 0.1 10*3/uL (ref 0.0–0.5)
Eosinophils Relative: 3 %
HCT: 37.7 % (ref 36.0–46.0)
Hemoglobin: 12.3 g/dL (ref 12.0–15.0)
Immature Granulocytes: 0 %
Lymphocytes Relative: 43 %
Lymphs Abs: 1.2 10*3/uL (ref 0.7–4.0)
MCH: 29 pg (ref 26.0–34.0)
MCHC: 32.6 g/dL (ref 30.0–36.0)
MCV: 88.9 fL (ref 80.0–100.0)
Monocytes Absolute: 0.3 10*3/uL (ref 0.1–1.0)
Monocytes Relative: 10 %
Neutro Abs: 1.2 10*3/uL — ABNORMAL LOW (ref 1.7–7.7)
Neutrophils Relative %: 43 %
Platelets: 123 10*3/uL — ABNORMAL LOW (ref 150–400)
RBC: 4.24 MIL/uL (ref 3.87–5.11)
RDW: 13.2 % (ref 11.5–15.5)
WBC: 2.8 10*3/uL — ABNORMAL LOW (ref 4.0–10.5)
nRBC: 0 % (ref 0.0–0.2)

## 2021-11-04 MED ORDER — SODIUM CHLORIDE 0.9 % IV BOLUS
1000.0000 mL | Freq: Once | INTRAVENOUS | Status: AC
  Administered 2021-11-04: 14:00:00 1000 mL via INTRAVENOUS

## 2021-11-04 NOTE — Telephone Encounter (Signed)
Pt showed up in office today requesting fluids. Pt states she has had loose stools for the past 2 weeks, about 6-7 a day. Pt has had abdominal pain with this and is scheduled for a colonoscopy next week. Pt declined fever, n/v, SOB, dizziness, cough or blood in stool. Per Judson Roch pt needs more than fluids and need to go to the ER with these ongoing issues. Pt advised and states understanding.

## 2021-11-04 NOTE — Discharge Instructions (Signed)
Follow-up with your primary doctor and with gastroenterology.  Come back to ER if you develop worsening nausea, vomiting, fever or other new concerning symptom.

## 2021-11-04 NOTE — ED Provider Notes (Signed)
[facility] EMERGENCY DEPARTMENT Provider Note   CSN: [id number] Arrival date & time: 11/04/21  1254     History Chief Complaint  Patient presents with   Diarrhea    Tami [name] is a 65 y.o. female.  Presents to ER with request for evaluation of diarrheal illness.  Reports that she has been having intermittent diarrhea over the past 3 months.  States that diarrhea seems to come for a few days and then will get better and then gets worse again.  Lately has been having multiple loose stools, about 6 in the last day.  Not watery and nonbloody.  Denies any associated abdominal pain.  No vomiting or nausea.  Reports that she has further testing scheduled on an outpatient basis with gastroenterology including colonoscopy in a week from now.  Reviewed chart, multiple ER visits, PCP visit and GI visit for diarrhea/abodminal pain. C diff on 12/09 negative. CT abd/pelv on 12/08 negative. CT abd/pel on 11/21 negative  HPI     Past Medical History:  Diagnosis Date   Abnormal SPEP 07/26/2016   Arthritis    knees, hands   Back pain 07/14/2016   Bowel obstruction ([place]) 11/2014   C. 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   Multiple myeloma ([place]) 07/20/2018   Multiple myeloma not having achieved remission ([place]) 07/20/2018   Myalgia 12/31/2016   Obstructive sleep apnea 03/31/2015   SOB (shortness of breath) 04/09/2016   Stroke ([place])    TIAs   UTI (urinary tract infection)     Patient Active Problem List   Diagnosis Date Noted   Dehydration 09/16/2021   Ear fullness, bilateral 08/12/2021    Bilateral knee pain 08/08/2021   History of 2019 novel coronavirus disease (COVID-19) 12/06/2020   Low vitamin D level 07/31/2020   Acute combined systolic and diastolic heart failure ([place]) 05/09/2020   Low magnesium level 03/04/2020   Thrombocytopenia ([place]) 03/04/2020   Dysphagia 03/04/2020   Constipation 03/04/2020   Sensation of pressure in bladder area 01/17/2020   Hyperglycemia 01/17/2020   Peripheral neuropathy 11/15/2019   Chronic migraine without aura, with intractable migraine, so stated, with status migrainosus 10/17/2019   ETD (Eustachian tube dysfunction), bilateral 09/12/2019   Urinary frequency 07/17/2019   Headache 07/17/2019   Palpitation 05/15/2019   Diarrhea 09/04/2018   Thyroid nodule 08/26/2018   Multiple myeloma ([place]) 07/20/2018   Multiple myeloma not having achieved remission ([place]) 07/20/2018   Hematuria 07/11/2018   Seasonal allergies 06/23/2018   Right shoulder pain 06/07/2018   Neck pain 06/07/2018   Paresthesias 06/07/2018   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   Pain in thoracic spine 03/18/2018   Thoracic back pain 02/15/2018   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   Cystitis 10/25/2017   Hypokalemia 08/20/2017   Anemia 08/20/2017   Elevated sed rate 08/15/2017   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   Epigastric pain 03/22/2017   Myalgia 12/31/2016   SOB (shortness of breath) 07/26/2016   Abnormal SPEP 07/26/2016   Low back pain 07/14/2016   Insomnia  12/29/2015   Tension headache 10/21/2015   Muscle spasms of neck 10/18/2015   Dysuria 10/13/2015   Sinusitis 07/07/2015   AP (abdominal pain) 07/07/2015   Cardiomyopathy ([place]) 04/19/2015   Chest wall pain 04/19/2015   Obstructive sleep apnea 03/31/2015   Congestive heart failure ([place]) 02/24/2015   Arthritis    Hyperlipidemia    Hypertension    Depression    Stroke [facility])    GERD (gastroesophageal reflux disease)    Bowel obstruction ([place]) 11/09/2014    Past  Surgical History:  Procedure Laterality Date   ABDOMINAL HYSTERECTOMY     menorraghia, 2006, total   CHOLECYSTECTOMY     COLONOSCOPY  2018   [facility] per patient   ESOPHAGOGASTRODUODENOSCOPY  2018   [facility]     right, repair torn torn cartialage   PACEMAKER INSERTION  08/2014   TONSILLECTOMY     TUBAL LIGATION       OB History   No obstetric history on file.     Family History  Problem Relation Age of Onset   Diabetes Mother    Hypertension Mother    Heart disease Mother        s/p 1 stent   Hyperlipidemia Mother    Arthritis Mother    Cancer Father        COLON   Colon cancer Father 32   Irritable bowel syndrome Sister    Hyperlipidemia Daughter    Hypertension Daughter    Hypertension Maternal Grandmother    Arthritis Maternal Grandmother    Heart disease Maternal Grandfather        MI   Hypertension Maternal Grandfather    Arthritis Maternal Grandfather    Hypertension Son    Esophageal cancer Neg Hx     Social History   Tobacco Use   Smoking status: Never   Smokeless tobacco: Never  Vaping Use   Vaping Use: Never used  Substance Use Topics   Alcohol use: No   Drug use: No    Home Medications Prior to Admission medications   Medication Sig Start Date End Date Taking? Authorizing Provider  acetaminophen (TYLENOL) 500 MG tablet Take 500 mg by mouth every 6 (six) hours as needed.    [provider]  acyclovir (ZOVIRAX) 400 MG tablet Take 400 mg by mouth 2 (two) times daily. 06/26/21   [provider]  albuterol (VENTOLIN HFA) 108 (90 Base) MCG/ACT inhaler Inhale 2 puffs into the lungs every 6 (six) hours as needed for wheezing or shortness of breath. 09/30/20   Mosie Lukes, MD  aspirin EC 81 MG tablet Take 1 tablet (81 mg total) by mouth daily. 02/15/18   Mosie Lukes, MD  azithromycin (ZITHROMAX) 250 MG tablet 2 tabs po qd x 1 day; 1 tablet per day x 4 days; 10/30/21   Marrian Salvage, FNP  budesonide-formoterol [facility]) 160-4.5 MCG/ACT inhaler Inhale 2 puffs into the lungs 2 (two) times daily. 10/01/20   Mosie Lukes, MD  carvedilol (COREG) 25 MG tablet Take 1 tablet (25 mg total) by mouth 2 (two) times daily with a meal. 06/04/20   Mosie Lukes, MD  celecoxib (CELEBREX) 100 MG capsule Take 1 capsule (100 mg total) by mouth 2 (two) times daily as needed for mild pain or moderate pain (with food). 08/07/21   Mosie Lukes, MD  Cholecalciferol (VITAMIN D-3) 25 MCG (1000 UT) CAPS Take 1 capsule (1,000 Units total) by  mouth daily. 05/03/20   Mosie Lukes, MD  cholestyramine Lucrezia Starch) 4 g packet Take 1 packet by mouth 2 (two) times daily. Patient not taking: Reported on 10/30/2021 06/16/21   [provider]  clotrimazole (GYNE-LOTRIMIN 3) 2 % vaginal cream Place 1 Applicatorful vaginally at bedtime. Patient not taking: Reported on 10/30/2021 06/18/21   Colon Branch, MD  cyclobenzaprine (FLEXERIL) 10 MG tablet Take 1 tablet (10 mg total) by mouth at bedtime. Patient not taking: Reported on 10/30/2021 02/19/20   Saguier, Percell Miller, PA-C  dicyclomine (BENTYL) 10 MG capsule Take 1 tab every 6 hours as needed for abdominal cramping. 05/28/21   Shelda Pal, DO  diphenhydrAMINE (BENADRYL) 25 mg capsule TAKE ONE (1) CAPSULE BY MOUTH THREE (3) TIMES EACH DAY AS NEEDED 03/11/21   [provider]  Ferrous Fumarate-Folic Acid 387-5 MG TABS Take 324 mg by mouth daily. Patient not taking: Reported on 10/30/2021 08/29/21   Mosie Lukes, MD  fluticasone [facility]) 50 MCG/ACT nasal spray Place 2 sprays into both nostrils daily. 01/07/20   Joy, Shawn C, PA-C  furosemide (LASIX) 20 MG tablet Take 1 tablet by mouth as directed. 07/03/20   [provider]  HYDROcodone-acetaminophen (NORCO/VICODIN) 5-325 MG tablet Take 1 tablet by mouth every 6 (six) hours as needed. 08/25/21   Suzy Bouchard, PA-C  hydrOXYzine (ATARAX/VISTARIL) 25 MG tablet Take 25  mg by mouth 3 (three) times daily. Patient not taking: Reported on 10/30/2021 04/25/21   [provider]  ibuprofen (ADVIL) 800 MG tablet Take 1 tablet (800 mg total) by mouth 3 (three) times daily. 08/25/21   Suzy Bouchard, PA-C  loratadine (CLARITIN) 10 MG tablet Take 1 tablet (10 mg total) by mouth daily. 09/08/21   Shelda Pal, DO  losartan (COZAAR) 25 MG tablet Take 25 mg by mouth. Patient not taking: Reported on 10/30/2021 05/06/20   [provider]  losartan (COZAAR) 50 MG tablet Take 75 mg by mouth daily. 04/14/21   [provider]  magnesium oxide (MAG-OX) 400 MG tablet Take 1 tablet (400 mg total) by mouth 2 (two) times daily. 03/04/20   Mosie Lukes, MD  meclizine (ANTIVERT) 12.5 MG tablet Take 12.5 mg by mouth 3 (three) times daily. 02/18/20   [provider]  metoCLOPramide (REGLAN) 10 MG tablet Take by mouth. 10/17/21   [provider]  montelukast (SINGULAIR) 10 MG tablet Take 1 tablet (10 mg total) by mouth at bedtime. Patient not taking: Reported on 10/30/2021 02/14/21   Debbrah Alar, NP  Multiple Vitamins-Minerals (MULTIVITAMIN ADULT PO) Take 1 tablet by mouth daily.    [provider]  omeprazole (PRILOSEC) 40 MG capsule Take 1 capsule (40 mg total) by mouth in the morning and at bedtime. 07/25/21   Shelda Pal, DO  predniSONE (DELTASONE) 20 MG tablet Take 1 tablet (20 mg total) by mouth daily with breakfast. 10/30/21   Marrian Salvage, FNP  promethazine (PHENERGAN) 25 MG tablet Take 1 tablet (25 mg total) by mouth every 6 (six) hours as needed for nausea or vomiting. 07/25/21   Nani Ravens, Crosby Oyster, DO  sucralfate (CARAFATE) 1 g tablet Take 1 tablet (1 g total) by mouth 3 (three) times daily as needed. prn 07/30/20   Mosie Lukes, MD  tiZANidine (ZANAFLEX) 4 MG tablet Take 1 tablet (4 mg total) by mouth every 6 (six) hours as needed for muscle spasms. 10/13/21   Shelda Pal,  DO  topiramate (TOPAMAX) 50 MG  tablet Take 1 tablet (50 mg total) by mouth 2 (two) times daily. 07/13/19   Mosie Lukes, MD  triamcinolone cream (KENALOG) 0.1 % Apply topically 2 (two) times daily. 03/11/21   [provider]  zinc sulfate 220 (50 Zn) MG capsule Take 220 mg by mouth 2 (two) times daily. 06/16/21   [provider]  zolpidem (AMBIEN) 5 MG tablet Take 1 tablet (5 mg total) by mouth at bedtime as needed for sleep. 05/08/21   Mosie Lukes, MD  prochlorperazine (COMPAZINE) 10 MG tablet Take 1 tablet (10 mg total) by mouth every 6 (six) hours as needed (Nausea or vomiting). 09/04/20 12/19/20  [name], MD    Allergies    Benazepril, Ondansetron hcl, Codeine, and Morphine  Review of Systems   Review of Systems  Constitutional:  Negative for chills and fever.  HENT:  Negative for ear pain and sore throat.   Eyes:  Negative for pain and visual disturbance.  Respiratory:  Negative for cough and shortness of breath.   Cardiovascular:  Negative for chest pain and palpitations.  Gastrointestinal:  Positive for diarrhea. Negative for abdominal pain and vomiting.  Genitourinary:  Negative for dysuria and hematuria.  Musculoskeletal:  Negative for arthralgias and back pain.  Skin:  Negative for color change and rash.  Neurological:  Negative for seizures and syncope.  All other systems reviewed and are negative.  Physical Exam Updated Vital Signs BP (!) 152/97 (BP Location: Right Arm)    Pulse 85    Temp 97.8 F (36.6 C) (Oral)    Resp 18    Ht $R'5\' 6"'Qa$  (1.676 m)    Wt 89.1 kg    SpO2 100%    BMI 31.70 kg/m   Physical Exam Vitals and nursing note reviewed.  Constitutional:      General: She is not in acute distress.    Appearance: She is well-developed.  HENT:     Head: Normocephalic and atraumatic.  Eyes:     Conjunctiva/sclera: Conjunctivae normal.  Cardiovascular:     Rate and Rhythm: Normal rate.     Heart sounds: No murmur heard. Pulmonary:      Effort: Pulmonary effort is normal. No respiratory distress.  Abdominal:     Palpations: Abdomen is soft.     Tenderness: There is no abdominal tenderness.  Musculoskeletal:        General: No swelling.     Cervical back: Neck supple.  Skin:    General: Skin is warm and dry.     Capillary Refill: Capillary refill takes less than 2 seconds.  Neurological:     General: No focal deficit present.     Mental Status: She is alert.  Psychiatric:        Mood and Affect: Mood normal.    ED Results / Procedures / Treatments   Labs (all labs ordered are listed, but only abnormal results are displayed) Labs Reviewed  CBC WITH DIFFERENTIAL/PLATELET - Abnormal; Notable for the following components:      Result Value   WBC 2.8 (*)    Platelets 123 (*)    Neutro Abs 1.2 (*)    All other components within normal limits  COMPREHENSIVE METABOLIC PANEL - Abnormal; Notable for the following components:   Glucose, Bld 109 (*)    Total Protein 8.4 (*)    All other components within normal limits  URINALYSIS, ROUTINE W REFLEX MICROSCOPIC    EKG None  Radiology No results found.  Procedures Procedures   Medications Ordered in ED Medications  sodium chloride 0.9 % bolus 1,000 mL (0 mLs Intravenous Stopped 11/04/21 1515)    ED Course  I have reviewed the triage vital signs and the nursing notes.  Pertinent labs & imaging results that were available during my care of the patient were reviewed by me and considered in my medical decision making (see chart for details).    MDM Rules/Calculators/A&P                         65 year old lady presenting to ER with concern for acute on chronic diarrhea.  Has had multiple ER visits and visits with primary care and gastroenterology regarding similar complaint.  Reports that she is scheduled for colonoscopy in 1 week from now.  On exam she appears well in no distress.  Obtain basic labs, no electrolyte derangements, normal kidney function.  UA  negative for infection.  Provided fluids.  Patient reports significant improvement in her symptoms.  She has not had any further diarrheal episodes while in ER.  Given her well appearance and lack of ongoing symptoms and work-up today, believe she is appropriate for continued outpatient management.  Recommended she follow-up both with her primary doctor and her gastroenterologist.  Discharged home.    After the discussed management above, the patient was determined to be safe for discharge.  The patient was in agreement with this plan and all questions regarding their care were answered.  ED return precautions were discussed and the patient will return to the ED with any significant worsening of condition.  Final Clinical Impression(s) / ED Diagnoses Final diagnoses:  Diarrhea, unspecified type    Rx / DC Orders ED Discharge Orders     None        Lucrezia Starch, MD 11/04/21 1526

## 2021-11-04 NOTE — ED Triage Notes (Signed)
Here with diarrhea x 3 months.  She was seen for same 2 weeks ago. She has a colonoscopy scheduled for next week.

## 2021-11-05 ENCOUNTER — Other Ambulatory Visit: Payer: Self-pay | Admitting: Hematology & Oncology

## 2021-11-06 ENCOUNTER — Other Ambulatory Visit: Payer: Self-pay

## 2021-11-06 ENCOUNTER — Ambulatory Visit (HOSPITAL_BASED_OUTPATIENT_CLINIC_OR_DEPARTMENT_OTHER)
Admission: RE | Admit: 2021-11-06 | Discharge: 2021-11-06 | Disposition: A | Discharge status: Home / Self Care | Payer: Medicare Other | Source: Ambulatory Visit | Attending: Family Medicine | Admitting: Family Medicine

## 2021-11-06 DIAGNOSIS — I5020 Unspecified systolic (congestive) heart failure: Secondary | ICD-10-CM | POA: Diagnosis not present

## 2021-11-06 DIAGNOSIS — K648 Other hemorrhoids: Secondary | ICD-10-CM | POA: Diagnosis not present

## 2021-11-06 DIAGNOSIS — K529 Noninfective gastroenteritis and colitis, unspecified: Secondary | ICD-10-CM | POA: Diagnosis not present

## 2021-11-06 DIAGNOSIS — I428 Other cardiomyopathies: Secondary | ICD-10-CM | POA: Diagnosis not present

## 2021-11-06 DIAGNOSIS — I10 Essential (primary) hypertension: Secondary | ICD-10-CM | POA: Diagnosis not present

## 2021-11-06 DIAGNOSIS — Z79899 Other long term (current) drug therapy: Secondary | ICD-10-CM | POA: Diagnosis not present

## 2021-11-06 DIAGNOSIS — Z8679 Personal history of other diseases of the circulatory system: Secondary | ICD-10-CM | POA: Diagnosis not present

## 2021-11-06 DIAGNOSIS — Z780 Asymptomatic menopausal state: Secondary | ICD-10-CM | POA: Insufficient documentation

## 2021-11-06 DIAGNOSIS — M810 Age-related osteoporosis without current pathological fracture: Secondary | ICD-10-CM | POA: Diagnosis not present

## 2021-11-06 DIAGNOSIS — G4700 Insomnia, unspecified: Secondary | ICD-10-CM | POA: Diagnosis not present

## 2021-11-06 DIAGNOSIS — Z20822 Contact with and (suspected) exposure to covid-19: Secondary | ICD-10-CM | POA: Diagnosis not present

## 2021-11-06 DIAGNOSIS — Z8579 Personal history of other malignant neoplasms of lymphoid, hematopoietic and related tissues: Secondary | ICD-10-CM | POA: Diagnosis not present

## 2021-11-06 DIAGNOSIS — G8929 Other chronic pain: Secondary | ICD-10-CM | POA: Diagnosis not present

## 2021-11-06 DIAGNOSIS — I110 Hypertensive heart disease with heart failure: Secondary | ICD-10-CM | POA: Diagnosis not present

## 2021-11-06 LAB — HM COLONOSCOPY

## 2021-11-07 DIAGNOSIS — D61818 Other pancytopenia: Secondary | ICD-10-CM | POA: Diagnosis not present

## 2021-11-07 DIAGNOSIS — R197 Diarrhea, unspecified: Secondary | ICD-10-CM | POA: Diagnosis not present

## 2021-11-07 DIAGNOSIS — I110 Hypertensive heart disease with heart failure: Secondary | ICD-10-CM | POA: Diagnosis not present

## 2021-11-07 DIAGNOSIS — K529 Noninfective gastroenteritis and colitis, unspecified: Secondary | ICD-10-CM | POA: Diagnosis not present

## 2021-11-07 DIAGNOSIS — I5020 Unspecified systolic (congestive) heart failure: Secondary | ICD-10-CM | POA: Diagnosis not present

## 2021-11-07 DIAGNOSIS — K648 Other hemorrhoids: Secondary | ICD-10-CM | POA: Diagnosis not present

## 2021-11-07 DIAGNOSIS — R634 Abnormal weight loss: Secondary | ICD-10-CM | POA: Diagnosis not present

## 2021-11-08 DIAGNOSIS — I110 Hypertensive heart disease with heart failure: Secondary | ICD-10-CM | POA: Diagnosis not present

## 2021-11-08 DIAGNOSIS — I5020 Unspecified systolic (congestive) heart failure: Secondary | ICD-10-CM | POA: Diagnosis not present

## 2021-11-08 DIAGNOSIS — R197 Diarrhea, unspecified: Secondary | ICD-10-CM | POA: Diagnosis not present

## 2021-11-09 DIAGNOSIS — I5020 Unspecified systolic (congestive) heart failure: Secondary | ICD-10-CM | POA: Diagnosis not present

## 2021-11-09 DIAGNOSIS — Z79899 Other long term (current) drug therapy: Secondary | ICD-10-CM | POA: Diagnosis not present

## 2021-11-09 DIAGNOSIS — G8929 Other chronic pain: Secondary | ICD-10-CM | POA: Diagnosis not present

## 2021-11-09 DIAGNOSIS — R197 Diarrhea, unspecified: Secondary | ICD-10-CM | POA: Diagnosis not present

## 2021-11-09 DIAGNOSIS — K529 Noninfective gastroenteritis and colitis, unspecified: Secondary | ICD-10-CM | POA: Diagnosis not present

## 2021-11-09 DIAGNOSIS — Z20822 Contact with and (suspected) exposure to covid-19: Secondary | ICD-10-CM | POA: Diagnosis not present

## 2021-11-09 DIAGNOSIS — I110 Hypertensive heart disease with heart failure: Secondary | ICD-10-CM | POA: Diagnosis not present

## 2021-11-09 DIAGNOSIS — K648 Other hemorrhoids: Secondary | ICD-10-CM | POA: Diagnosis not present

## 2021-11-09 DIAGNOSIS — I428 Other cardiomyopathies: Secondary | ICD-10-CM | POA: Diagnosis not present

## 2021-11-09 DIAGNOSIS — G4700 Insomnia, unspecified: Secondary | ICD-10-CM | POA: Diagnosis not present

## 2021-11-10 DIAGNOSIS — K529 Noninfective gastroenteritis and colitis, unspecified: Secondary | ICD-10-CM | POA: Diagnosis not present

## 2021-11-10 DIAGNOSIS — Z20822 Contact with and (suspected) exposure to covid-19: Secondary | ICD-10-CM | POA: Diagnosis not present

## 2021-11-10 DIAGNOSIS — I10 Essential (primary) hypertension: Secondary | ICD-10-CM | POA: Diagnosis not present

## 2021-11-10 DIAGNOSIS — G4700 Insomnia, unspecified: Secondary | ICD-10-CM | POA: Diagnosis not present

## 2021-11-10 DIAGNOSIS — I428 Other cardiomyopathies: Secondary | ICD-10-CM | POA: Diagnosis not present

## 2021-11-10 DIAGNOSIS — K648 Other hemorrhoids: Secondary | ICD-10-CM | POA: Diagnosis not present

## 2021-11-10 DIAGNOSIS — I110 Hypertensive heart disease with heart failure: Secondary | ICD-10-CM | POA: Diagnosis not present

## 2021-11-10 DIAGNOSIS — G8929 Other chronic pain: Secondary | ICD-10-CM | POA: Diagnosis not present

## 2021-11-10 DIAGNOSIS — I5020 Unspecified systolic (congestive) heart failure: Secondary | ICD-10-CM | POA: Diagnosis not present

## 2021-11-10 DIAGNOSIS — Z79899 Other long term (current) drug therapy: Secondary | ICD-10-CM | POA: Diagnosis not present

## 2021-11-11 ENCOUNTER — Other Ambulatory Visit: Payer: Self-pay

## 2021-11-11 ENCOUNTER — Inpatient Hospital Stay: Payer: Medicare Other | Attending: Hematology & Oncology

## 2021-11-11 ENCOUNTER — Encounter: Payer: Self-pay | Admitting: Hematology & Oncology

## 2021-11-11 ENCOUNTER — Inpatient Hospital Stay (HOSPITAL_BASED_OUTPATIENT_CLINIC_OR_DEPARTMENT_OTHER): Payer: Medicare Other | Admitting: Hematology & Oncology

## 2021-11-11 VITALS — BP 132/81 | HR 77 | Temp 98.1°F | Resp 18 | Wt 195.0 lb

## 2021-11-11 DIAGNOSIS — C9000 Multiple myeloma not having achieved remission: Secondary | ICD-10-CM | POA: Diagnosis not present

## 2021-11-11 DIAGNOSIS — R197 Diarrhea, unspecified: Secondary | ICD-10-CM | POA: Diagnosis not present

## 2021-11-11 LAB — CBC WITH DIFFERENTIAL (CANCER CENTER ONLY)
Abs Immature Granulocytes: 0 10*3/uL (ref 0.00–0.07)
Basophils Absolute: 0 10*3/uL (ref 0.0–0.1)
Basophils Relative: 1 %
Eosinophils Absolute: 0.1 10*3/uL (ref 0.0–0.5)
Eosinophils Relative: 2 %
HCT: 36.9 % (ref 36.0–46.0)
Hemoglobin: 11.9 g/dL — ABNORMAL LOW (ref 12.0–15.0)
Immature Granulocytes: 0 %
Lymphocytes Relative: 34 %
Lymphs Abs: 0.9 10*3/uL (ref 0.7–4.0)
MCH: 28.9 pg (ref 26.0–34.0)
MCHC: 32.2 g/dL (ref 30.0–36.0)
MCV: 89.6 fL (ref 80.0–100.0)
Monocytes Absolute: 0.2 10*3/uL (ref 0.1–1.0)
Monocytes Relative: 8 %
Neutro Abs: 1.5 10*3/uL — ABNORMAL LOW (ref 1.7–7.7)
Neutrophils Relative %: 55 %
Platelet Count: 120 10*3/uL — ABNORMAL LOW (ref 150–400)
RBC: 4.12 MIL/uL (ref 3.87–5.11)
RDW: 13.2 % (ref 11.5–15.5)
WBC Count: 2.8 10*3/uL — ABNORMAL LOW (ref 4.0–10.5)
nRBC: 0 % (ref 0.0–0.2)

## 2021-11-11 LAB — CMP (CANCER CENTER ONLY)
ALT: 33 U/L (ref 0–44)
AST: 49 U/L — ABNORMAL HIGH (ref 15–41)
Albumin: 3.9 g/dL (ref 3.5–5.0)
Alkaline Phosphatase: 70 U/L (ref 38–126)
Anion gap: 8 (ref 5–15)
BUN: 16 mg/dL (ref 8–23)
CO2: 26 mmol/L (ref 22–32)
Calcium: 9.5 mg/dL (ref 8.9–10.3)
Chloride: 107 mmol/L (ref 98–111)
Creatinine: 0.64 mg/dL (ref 0.44–1.00)
GFR, Estimated: >60 mL/min (ref >60)
Glucose, Bld: 102 mg/dL — ABNORMAL HIGH (ref 70–99)
Potassium: 4.1 mmol/L (ref 3.5–5.1)
Sodium: 141 mmol/L (ref 135–145)
Total Bilirubin: 0.3 mg/dL (ref 0.3–1.2)
Total Protein: 8.2 g/dL — ABNORMAL HIGH (ref 6.5–8.1)

## 2021-11-11 LAB — LACTATE DEHYDROGENASE: LDH: 230 U/L — ABNORMAL HIGH (ref 98–192)

## 2021-11-11 NOTE — Progress Notes (Signed)
Hematology and Oncology Follow Up Visit  Tami [name] [id number] 25-Aug-1956 66 y.o. 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  Unfortunately, she was back in the hospital right after Christmas.  She was having a lot of diarrhea.  She did have a colonoscopy.  We do not have the report back yet.  Nor the I could think of that might be associated with her having myeloma would be amyloidosis.  I do not see any evidence of this otherwise on her exam.  However, a biopsy could certainly tell us if there is amyloidosis.  Her myeloma studies have not gone up.  We last saw her in November, her M spike was 1.3 g/dL.  The Kappa light chain was up a little bit at 6.1 mg/dL.  The IgG level was up a little bit at 3100 mg/dL.Marland Kitchen  She is on Imodium right now.  She said this is helping the diarrhea.  When she went into the hospital, she was having 6-7 stools a day.  She has had no bleeding.  She has had no vomiting.  She does have some nausea.  Her appetite has been okay.  I think she may have lost a little bit of weight with this diarrhea.  Overall, I would have to say that her performance status right now is ECOG 1.    Medications:  Allergies as of 11/11/2021       Reactions   Benazepril Anaphylaxis, Swelling, Hives   angioedema Throat and lip swelling   Ondansetron Hcl Hives   Redness and hives post IV admin on 07/05/17   Codeine Nausea And Vomiting   Morphine Hives   Redness and hives noted post IV admin on 07/05/17        Medication List        Accurate as of November 11, 2021   2:08 PM. If you have any questions, ask your nurse or doctor.          STOP taking these medications    predniSONE 20 MG tablet Commonly known as: DELTASONE Stopped by: [name], MD       TAKE these medications    acetaminophen 500 MG tablet Commonly known as: TYLENOL Take 500 mg by mouth every 6 (six) hours as needed.   acyclovir 400 MG tablet Commonly known as: ZOVIRAX TAKE ONE (1) TABLET BY MOUTH TWO (2) TIMES DAILY   albuterol 108 (90 Base) MCG/ACT inhaler Commonly known as: VENTOLIN HFA Inhale 2 puffs into the lungs every 6 (six) hours as needed for wheezing or shortness of breath.   aspirin EC 81 MG tablet Take 1 tablet (81 mg total) by mouth daily.   azithromycin 250 MG tablet Commonly known as: ZITHROMAX 2 tabs po qd x 1 day; 1 tablet per day x 4 days;   budesonide-formoterol 160-4.5 MCG/ACT inhaler Commonly known as: SYMBICORT Inhale 2 puffs into the lungs 2 (two) times daily.   carvedilol 25 MG tablet Commonly known as: COREG Take 1 tablet (25 mg total) by mouth 2 (two) times  daily with a meal.   celecoxib 100 MG capsule Commonly known as: CeleBREX Take 1 capsule (100 mg total) by mouth 2 (two) times daily as needed for mild pain or moderate pain (with food).   cholestyramine 4 g packet Commonly known as: QUESTRAN Take 1 packet by mouth 2 (two) times daily.   clotrimazole 2 % vaginal cream Commonly known as: GYNE-LOTRIMIN 3 Place 1 Applicatorful vaginally at bedtime.   cyclobenzaprine 10 MG tablet Commonly known as: FLEXERIL Take 1 tablet (10 mg total) by mouth at bedtime.   dicyclomine 10 MG capsule Commonly known as: BENTYL Take 1 tab every 6 hours as needed for abdominal cramping.   diphenhydrAMINE 25 mg capsule Commonly known as: BENADRYL TAKE ONE (1) CAPSULE BY MOUTH THREE (3) TIMES EACH DAY AS NEEDED   Ferrous Fumarate-Folic Acid 315-1 MG Tabs Take 324 mg by mouth daily.   fluticasone 50 MCG/ACT nasal spray Commonly known  as: FLONASE Place 2 sprays into both nostrils daily.   furosemide 20 MG tablet Commonly known as: LASIX Take 1 tablet by mouth as directed.   HYDROcodone-acetaminophen 5-325 MG tablet Commonly known as: NORCO/VICODIN Take 1 tablet by mouth every 6 (six) hours as needed.   hydrOXYzine 25 MG tablet Commonly known as: ATARAX Take 25 mg by mouth 3 (three) times daily.   ibuprofen 800 MG tablet Commonly known as: ADVIL Take 1 tablet (800 mg total) by mouth 3 (three) times daily.   loperamide 2 MG capsule Commonly known as: IMODIUM Take 2 mg by mouth.   loratadine 10 MG tablet Commonly known as: CLARITIN Take 1 tablet (10 mg total) by mouth daily.   losartan 25 MG tablet Commonly known as: COZAAR Take 25 mg by mouth.   losartan 50 MG tablet Commonly known as: COZAAR Take 75 mg by mouth daily.   magnesium oxide 400 MG tablet Commonly known as: MAG-OX Take 1 tablet (400 mg total) by mouth 2 (two) times daily.   meclizine 12.5 MG tablet Commonly known as: ANTIVERT Take 12.5 mg by mouth 3 (three) times daily.   metoCLOPramide 10 MG tablet Commonly known as: REGLAN Take by mouth.   montelukast 10 MG tablet Commonly known as: Singulair Take 1 tablet (10 mg total) by mouth at bedtime.   MULTIVITAMIN ADULT PO Take 1 tablet by mouth daily.   omeprazole 40 MG capsule Commonly known as: PRILOSEC Take 1 capsule (40 mg total) by mouth in the morning and at bedtime.   promethazine 25 MG tablet Commonly known as: PHENERGAN Take 1 tablet (25 mg total) by mouth every 6 (six) hours as needed for nausea or vomiting.   sucralfate 1 g tablet Commonly known as: Carafate Take 1 tablet (1 g total) by mouth 3 (three) times daily as needed. prn   sulfamethoxazole-trimethoprim 800-160 MG tablet Commonly known as: BACTRIM DS Take 1 tablet by mouth 2 (two) times daily.   tiZANidine 4 MG tablet Commonly known as: Zanaflex Take 1 tablet (4 mg total) by mouth every 6 (six) hours as  needed for muscle spasms.   topiramate 50 MG tablet Commonly known as: Topamax Take 1 tablet (50 mg total) by mouth 2 (two) times daily.   triamcinolone cream 0.1 % Commonly known as: KENALOG Apply topically 2 (two) times daily.   Vitamin D-3 25 MCG (1000 UT) Caps Take 1 capsule (1,000 Units total) by mouth daily.   zinc sulfate 220 (50 Zn) MG capsule Take 220 mg by mouth 2 (two) times daily.  zolpidem 5 MG tablet Commonly known as: AMBIEN Take 1 tablet (5 mg total) by mouth at bedtime as needed for sleep.        Allergies:  Allergies  Allergen Reactions   Benazepril Anaphylaxis, Swelling and Hives    angioedema Throat and lip swelling   Ondansetron Hcl Hives    Redness and hives post IV admin on 07/05/17   Codeine Nausea And Vomiting   Morphine Hives    Redness and hives noted post IV admin on 07/05/17    Past Medical History, Surgical history, Social history, and Family History were reviewed and updated.  Review of Systems: Review of Systems  Constitutional: Negative.   HENT: Negative.    Eyes: Negative.   Respiratory: Negative.    Cardiovascular: Negative.   Gastrointestinal: Negative.   Genitourinary: Negative.   Musculoskeletal: Negative.   Skin: Negative.   Neurological: Negative.   Endo/Heme/Allergies: Negative.   Psychiatric/Behavioral: Negative.       Physical Exam:  weight is 195 lb (88.5 kg). Her oral temperature is 98.1 F (36.7 C). Her blood pressure is 132/81 and her [name] is 77. Her respiration is 18 and oxygen saturation is 99%.   Wt Readings from Last 3 Encounters:  11/11/21 195 lb (88.5 kg)  11/04/21 196 lb 6.9 oz (89.1 kg)  10/30/21 196 lb 6.4 oz (89.1 kg)    Physical Exam Vitals reviewed.  HENT:     Head: Normocephalic and atraumatic.  Eyes:     Pupils: Pupils are equal, round, and reactive to light.  Cardiovascular:     Rate and Rhythm: Normal rate and regular rhythm.     Heart sounds: Normal heart sounds.  Pulmonary:      Effort: Pulmonary effort is normal.     Breath sounds: Normal breath sounds.  Abdominal:     General: Bowel sounds are normal.     Palpations: Abdomen is soft.  Musculoskeletal:        General: No tenderness or deformity. Normal range of motion.     Cervical back: Normal range of motion.  Lymphadenopathy:     Cervical: No cervical adenopathy.  Skin:    General: Skin is warm and dry.     Findings: No erythema or rash.  Neurological:     Mental Status: She is alert and oriented to person, place, and time.  Psychiatric:        Behavior: Behavior normal.        Thought Content: Thought content normal.        Judgment: Judgment normal.     Lab Results  Component Value Date   WBC 2.8 (L) 11/11/2021   HGB 11.9 (L) 11/11/2021   HCT 36.9 11/11/2021   MCV 89.6 11/11/2021   PLT 120 (L) 11/11/2021   Lab Results  Component Value Date   FERRITIN 720 (H) 08/28/2021   IRON 60 08/28/2021   TIBC 322 08/28/2021   UIBC 160 08/26/2020   IRONPCTSAT 19 08/28/2021   Lab Results  Component Value Date   RETICCTPCT 0.8 07/16/2016   RBC 4.12 11/11/2021   RETICCTABS 32,640 07/16/2016   Lab Results  Component Value Date   KPAFRELGTCHN 61.1 (H) 09/18/2021   LAMBDASER 3.8 (L) 09/18/2021   KAPLAMBRATIO 16.08 (H) 09/18/2021   Lab Results  Component Value Date   IGGSERUM 3,178 (H) 09/18/2021   IGA 130 09/18/2021   IGMSERUM 116 09/18/2021   Lab Results  Component Value Date   TOTALPROTELP 8.1 09/18/2021   ALBUMINELP 3.7  09/18/2021   A1GS 0.2 09/18/2021   A2GS 1.0 09/18/2021   BETS 1.0 09/18/2021   BETA2SER 0.5 07/16/2016   GAMS 2.2 (H) 09/18/2021   MSPIKE 1.3 (H) 09/18/2021   SPEI Comment 12/19/2020     Chemistry      Component Value Date/Time   NA 141 11/11/2021 1303   NA 143 11/08/2017 1127   NA 140 12/31/2016 1000   K 4.1 11/11/2021 1303   K 4.0 11/08/2017 1127   K 3.6 12/31/2016 1000   CL 107 11/11/2021 1303   CL 107 11/08/2017 1127   CO2 26 11/11/2021 1303   CO2 25  11/08/2017 1127   CO2 24 12/31/2016 1000   BUN 16 11/11/2021 1303   BUN 19 11/08/2017 1127   BUN 22.3 12/31/2016 1000   CREATININE 0.64 11/11/2021 1303   CREATININE 0.9 11/08/2017 1127   CREATININE 0.8 12/31/2016 1000      Component Value Date/Time   CALCIUM 9.5 11/11/2021 1303   CALCIUM 9.9 11/08/2017 1127   CALCIUM 9.5 12/31/2016 1000   ALKPHOS 70 11/11/2021 1303   ALKPHOS 44 11/08/2017 1127   ALKPHOS 51 12/31/2016 1000   AST 49 (H) 11/11/2021 1303   AST 28 12/31/2016 1000   ALT 33 11/11/2021 1303   ALT 36 11/08/2017 1127   ALT 16 12/31/2016 1000   BILITOT 0.3 11/11/2021 1303   BILITOT 0.46 12/31/2016 1000       Impression and Plan: Tami [name] is a very pleasant 66 yo African American female with a previous IgG kappa MGUS with progression to myeloma.   So far, she been off treatment now for about 13 months.  As such, I cannot imagine anything treatment related could be causing this diarrhea.  Again, amyloidosis would be the only thing I could think of that might be causing this diarrhea.  It would be nice if we saw something elsewhere on exam.  Again the biopsies will have to give [place] an idea as to what to do if anything.  If she does have amyloidosis, she will clearly need to be treated.  Quality of life clearly has been the focus for Tami [name].  For right now, we will plan to get her back in another month.   [name], MD 1/3/20232:08 PM

## 2021-11-12 LAB — KAPPA/LAMBDA LIGHT CHAINS
Kappa free light chain: 61.1 mg/L — ABNORMAL HIGH (ref 3.3–19.4)
Kappa, lambda light chain ratio: 15.28 — ABNORMAL HIGH (ref 0.26–1.65)
Lambda free light chains: 4 mg/L — ABNORMAL LOW (ref 5.7–26.3)

## 2021-11-12 LAB — IGG, IGA, IGM
IgA: 104 mg/dL (ref 87–352)
IgG (Immunoglobin G), Serum: 2479 mg/dL — ABNORMAL HIGH (ref 586–1602)
IgM (Immunoglobulin M), Srm: 104 mg/dL (ref 26–217)

## 2021-11-12 NOTE — Progress Notes (Signed)
Will discuss at visit tomorrow with Charlett Blake

## 2021-11-13 ENCOUNTER — Ambulatory Visit: Payer: Medicare Other | Admitting: Hematology & Oncology

## 2021-11-13 ENCOUNTER — Other Ambulatory Visit: Payer: Medicare Other

## 2021-11-13 ENCOUNTER — Ambulatory Visit: Payer: Medicare Other | Admitting: Family Medicine

## 2021-11-14 ENCOUNTER — Telehealth: Payer: Self-pay

## 2021-11-14 ENCOUNTER — Telehealth: Payer: Self-pay | Admitting: *Deleted

## 2021-11-14 LAB — PROTEIN ELECTROPHORESIS, SERUM, WITH REFLEX
A/G Ratio: 0.7 (ref 0.7–1.7)
Albumin ELP: 3.3 g/dL (ref 2.9–4.4)
Alpha-1-Globulin: 0.3 g/dL (ref 0.0–0.4)
Alpha-2-Globulin: 1.1 g/dL — ABNORMAL HIGH (ref 0.4–1.0)
Beta Globulin: 1.1 g/dL (ref 0.7–1.3)
Gamma Globulin: 2.3 g/dL — ABNORMAL HIGH (ref 0.4–1.8)
Globulin, Total: 4.7 g/dL — ABNORMAL HIGH (ref 2.2–3.9)
M-Spike, %: 1.4 g/dL — ABNORMAL HIGH
SPEP Interpretation: 0
Total Protein ELP: 8 g/dL (ref 6.0–8.5)

## 2021-11-14 LAB — IMMUNOFIXATION REFLEX, SERUM
IgA: 57 mg/dL — ABNORMAL LOW (ref 87–352)
IgG (Immunoglobin G), Serum: 501 mg/dL — ABNORMAL LOW (ref 586–1602)
IgM (Immunoglobulin M), Srm: 8 mg/dL — ABNORMAL LOW (ref 26–217)

## 2021-11-14 NOTE — Telephone Encounter (Addendum)
Attached message left on personalized VM with instructions to contact the office for questions. dph  ----- Message from [name], MD sent at 11/14/2021  3:02 PM EST ----- Call - the myeloma is pretty stable!!!   Laurey Arrow

## 2021-11-14 NOTE — Telephone Encounter (Signed)
Patient notified of results of dexa scan.

## 2021-11-17 DIAGNOSIS — G8929 Other chronic pain: Secondary | ICD-10-CM | POA: Diagnosis not present

## 2021-11-17 DIAGNOSIS — R109 Unspecified abdominal pain: Secondary | ICD-10-CM | POA: Diagnosis not present

## 2021-11-17 DIAGNOSIS — I509 Heart failure, unspecified: Secondary | ICD-10-CM | POA: Diagnosis not present

## 2021-11-17 DIAGNOSIS — Z79899 Other long term (current) drug therapy: Secondary | ICD-10-CM | POA: Diagnosis not present

## 2021-11-17 DIAGNOSIS — Z1211 Encounter for screening for malignant neoplasm of colon: Secondary | ICD-10-CM | POA: Diagnosis not present

## 2021-11-17 DIAGNOSIS — Z23 Encounter for immunization: Secondary | ICD-10-CM | POA: Diagnosis not present

## 2021-11-17 DIAGNOSIS — E785 Hyperlipidemia, unspecified: Secondary | ICD-10-CM | POA: Diagnosis not present

## 2021-11-19 DIAGNOSIS — H04123 Dry eye syndrome of bilateral lacrimal glands: Secondary | ICD-10-CM | POA: Diagnosis not present

## 2021-11-19 DIAGNOSIS — H25813 Combined forms of age-related cataract, bilateral: Secondary | ICD-10-CM | POA: Diagnosis not present

## 2021-11-19 DIAGNOSIS — H35713 Central serous chorioretinopathy, bilateral: Secondary | ICD-10-CM | POA: Diagnosis not present

## 2021-11-19 DIAGNOSIS — G44219 Episodic tension-type headache, not intractable: Secondary | ICD-10-CM | POA: Diagnosis not present

## 2021-11-19 DIAGNOSIS — H538 Other visual disturbances: Secondary | ICD-10-CM | POA: Diagnosis not present

## 2021-11-20 ENCOUNTER — Ambulatory Visit: Payer: Medicare Other | Admitting: Family Medicine

## 2021-11-20 ENCOUNTER — Ambulatory Visit (INDEPENDENT_AMBULATORY_CARE_PROVIDER_SITE_OTHER): Payer: Commercial Managed Care - HMO | Admitting: Family Medicine

## 2021-11-20 ENCOUNTER — Encounter: Payer: Self-pay | Admitting: Family Medicine

## 2021-11-20 DIAGNOSIS — R0602 Shortness of breath: Secondary | ICD-10-CM | POA: Diagnosis not present

## 2021-11-20 DIAGNOSIS — R197 Diarrhea, unspecified: Secondary | ICD-10-CM | POA: Diagnosis not present

## 2021-11-20 DIAGNOSIS — I10 Essential (primary) hypertension: Secondary | ICD-10-CM | POA: Diagnosis not present

## 2021-11-20 DIAGNOSIS — R700 Elevated erythrocyte sedimentation rate: Secondary | ICD-10-CM

## 2021-11-20 DIAGNOSIS — R739 Hyperglycemia, unspecified: Secondary | ICD-10-CM | POA: Diagnosis not present

## 2021-11-20 MED ORDER — ALBUTEROL SULFATE HFA 108 (90 BASE) MCG/ACT IN AERS: 2.0000 | INHALATION_SPRAY | Freq: Four times a day (QID) | RESPIRATORY_TRACT | 5 refills | Status: DC | PRN

## 2021-11-20 MED ORDER — CHOLESTYRAMINE 4 G PO PACK: 4.0000 g | PACK | Freq: Two times a day (BID) | ORAL | 3 refills | Status: DC

## 2021-11-20 MED ORDER — FLUTICASONE FUROATE-VILANTEROL 100-25 MCG/ACT IN AEPB: 1.0000 | INHALATION_SPRAY | Freq: Every day | RESPIRATORY_TRACT | 11 refills | Status: DC

## 2021-11-20 MED ORDER — DIPHENOXYLATE-ATROPINE 2.5-0.025 MG PO TABS: 1.0000 | ORAL_TABLET | Freq: Four times a day (QID) | ORAL | 0 refills | Status: DC | PRN

## 2021-11-20 NOTE — Patient Instructions (Addendum)
Tums 2 tabs two to three x daily   Diarrhea, Adult Diarrhea is frequent loose and watery bowel movements. Diarrhea can make you feel weak and cause you to become dehydrated. Dehydration can make you tired and thirsty, cause you to have a dry mouth, and decrease how often you urinate. Diarrhea typically lasts 2-3 days. However, it can last longer if it is a sign of something more serious. It is important to treat your diarrhea as told by your health care provider. Follow these instructions at home: Eating and drinking   Follow these recommendations as told by your health care provider: Take an oral rehydration solution (ORS). This is an over-the-counter medicine that helps return your body to its normal balance of nutrients and water. It is found at pharmacies and retail stores. Drink plenty of fluids, such as water, ice chips, diluted fruit juice, and low-calorie sports drinks. You can drink milk also, if desired. Avoid drinking fluids that contain a lot of sugar or caffeine, such as energy drinks, sports drinks, and soda. Eat bland, easy-to-digest foods in small amounts as you are able. These foods include bananas, applesauce, rice, lean meats, toast, and crackers. Avoid alcohol. Avoid spicy or fatty foods.  Medicines Take over-the-counter and prescription medicines only as told by your health care provider. If you were prescribed an antibiotic medicine, take it as told by your health care provider. Do not stop using the antibiotic even if you start to feel better. General instructions  Wash your hands often using soap and water. If soap and water are not available, use a hand sanitizer. Others in the household should wash their hands as well. Hands should be washed: After using the toilet or changing a diaper. Before preparing, cooking, or serving food. While caring for a sick person or while visiting someone in a hospital. Drink enough fluid to keep your urine pale yellow. Rest at home  while you recover. Watch your condition for any changes. Take a warm bath to relieve any burning or pain from frequent diarrhea episodes. Keep all follow-up visits as told by your health care provider. This is important. Contact a health care provider if: You have a fever. Your diarrhea gets worse. You have new symptoms. You cannot keep fluids down. You feel light-headed or dizzy. You have a headache. You have muscle cramps. Get help right away if: You have chest pain. You feel extremely weak or you faint. You have bloody or black stools or stools that look like tar. You have severe pain, cramping, or bloating in your abdomen. You have trouble breathing or you are breathing very quickly. Your heart is beating very quickly. Your skin feels cold and clammy. You feel confused. You have signs of dehydration, such as: Dark urine, very little urine, or no urine. Cracked lips. Dry mouth. Sunken eyes. Sleepiness. Weakness. Summary Diarrhea is frequent loose and sometimes watery bowel movements. Diarrhea can make you feel weak and cause you to become dehydrated. Drink enough fluids to keep your urine pale yellow. Make sure that you wash your hands after using the toilet. If soap and water are not available, use hand sanitizer. Contact a health care provider if your diarrhea gets worse or you have new symptoms. Get help right away if you have signs of dehydration. This information is not intended to replace advice given to you by your health care provider. Make sure you discuss any questions you have with your health care provider. Document Revised: 05/07/2021 Document Reviewed: 05/07/2021 [employer]vier  Patient Education © 2022 [employer] ° °

## 2021-11-23 NOTE — Progress Notes (Signed)
Subjective:    [name], female    DOB: 11/24/1955, 66 y.o.   MRN: [id number]  No chief complaint on file.   HPI Patient is in today for follow up after a recent hospitalization for persistent diarrhea with abdominal pain and CHF exacerbation. She feels some better today but is very frustrated with her ongoing diarrhea numerous times a day.  No bloody or tarry stool noted. Notes some nausea and poor appetite but no vomiting. She notes her opthamologist has just put her on Prednisone but she is unsure why. No fevers or chills but some myalgias are noted. Her abdominal pain comes and goes with bowel movements. Denies CP/palp/HA/congestion/fevers or GU c/o. Taking meds as prescribed   Past Medical History:  Diagnosis Date   Abnormal SPEP 07/26/2016   Arthritis    knees, hands   Back pain 07/14/2016   Bowel obstruction (HCC) 11/2014   C. 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   Multiple myeloma (HCC) 07/20/2018   Multiple myeloma not having achieved remission (HCC) 07/20/2018   Myalgia 12/31/2016   Obstructive sleep apnea 03/31/2015   SOB (shortness of breath) 04/09/2016   Stroke (HCC)    TIAs   UTI (urinary tract infection)     Past Surgical History:  Procedure Laterality Date   ABDOMINAL HYSTERECTOMY     menorraghia, 2006, total   CHOLECYSTECTOMY     COLONOSCOPY  2018   [facility] per patient   ESOPHAGOGASTRODUODENOSCOPY  2018   [facility]    KNEE SURGERY     right, repair torn torn cartialage   PACEMAKER INSERTION  08/2014   TONSILLECTOMY     TUBAL  LIGATION      Family History  Problem Relation Age of Onset   Diabetes Mother    Hypertension Mother    Heart disease Mother        s/p 1 stent   Hyperlipidemia Mother    Arthritis Mother    Cancer Father        COLON   Colon cancer Father 64   Irritable bowel syndrome Sister    Hyperlipidemia Daughter    Hypertension Daughter    Hypertension Maternal Grandmother    Arthritis Maternal Grandmother    Heart disease Maternal Grandfather        MI   Hypertension Maternal Grandfather    Arthritis Maternal Grandfather    Hypertension Son    Esophageal cancer Neg Hx     Social History   Socioeconomic History   Marital status: Divorced    Spouse name: [name]   Number of children: 3   Years of education: [name]   Highest education level: High school graduate  Occupational History   [name]  Tobacco Use   Smoking status: Never   Smokeless tobacco: Never  Vaping Use   Vaping Use: Never used  Substance and Sexual Activity   Alcohol use: No   Drug use: No   Sexual activity: [name]  Other Topics Concern  [name]  Social History Narrative   Lives at home alone   Retired   Caffeine: coffee   Social Determinants of Radio broadcast assistant Strain: Low Risk    Difficulty of Paying Living Expenses: Not hard at all  Food Insecurity: No Food Insecurity   Worried About Charity fundraiser in the Last Year: Never true   Arboriculturist in the Last Year: Never true  Transportation Needs: No Transportation Needs   Lack of Transportation (Medical): No   Lack of Transportation (Non-Medical): No  Physical Activity: Insufficiently Active   Days of Exercise per Week: 2 days   Minutes of Exercise per Session: 30 min  Stress: No Stress Concern Present   Feeling of Stress : Not at all  Social Connections: Moderately Isolated   Frequency of Communication with Friends and Family: More than three times a week   Frequency of Social Gatherings with Friends  and Family: More than three times a week   Attends Religious Services: More than 4 times per year   Active Member of [employer] or Organizations: No   Attends Archivist Meetings: Never   Marital Status: Divorced  Human resources officer Violence: Not At Risk   Fear of Current or Ex-Partner: No   Emotionally Abused: No   Physically Abused: No   Sexually Abused: No    Outpatient Medications Prior to Visit  Medication Sig Dispense Refill   acetaminophen (TYLENOL) 500 MG tablet Take 500 mg by mouth every 6 (six) hours as needed.     acyclovir (ZOVIRAX) 400 MG tablet TAKE ONE (1) TABLET BY MOUTH TWO (2) TIMES DAILY 60 tablet 1   aspirin EC 81 MG tablet Take 1 tablet (81 mg total) by mouth daily. 90 tablet 1   budesonide-formoterol (SYMBICORT) 160-4.5 MCG/ACT inhaler Inhale 2 puffs into the lungs 2 (two) times daily. 1 each 3   carvedilol (COREG) 25 MG tablet Take 1 tablet (25 mg total) by mouth 2 (two) times daily with a meal. 180 tablet 1   celecoxib (CELEBREX) 100 MG capsule Take 1 capsule (100 mg total) by mouth 2 (two) times daily as needed for mild pain or moderate pain (with food). 60 capsule 3   Cholecalciferol (VITAMIN D-3) 25 MCG (1000 UT) CAPS Take 1 capsule (1,000 Units total) by mouth daily. 60 capsule 2   dicyclomine (BENTYL) 10 MG capsule Take 1 tab every 6 hours as needed for abdominal cramping. 60 capsule 0   diphenhydrAMINE (BENADRYL) 25 mg capsule TAKE ONE (1) CAPSULE BY MOUTH THREE (3) TIMES EACH DAY AS NEEDED     furosemide (LASIX) 20 MG tablet Take 1 tablet by mouth as directed.     HYDROcodone-acetaminophen (NORCO/VICODIN) 5-325 MG tablet Take 1 tablet by mouth every 6 (six) hours as needed. 6 tablet 0   ibuprofen (ADVIL) 800 MG tablet Take 1 tablet (800 mg total) by mouth 3 (three) times daily. 21 tablet 0   loperamide (IMODIUM) 2 MG capsule Take 2 mg by mouth.     loratadine (CLARITIN) 10 MG tablet Take 1 tablet (10 mg total) by mouth daily. 30 tablet 11   losartan  (COZAAR) 50 MG tablet Take 75 mg by mouth daily.     magnesium oxide (MAG-OX) 400 MG tablet Take 1 tablet (400 mg total) by mouth 2 (two) times daily. 60 tablet 1   meclizine (ANTIVERT) 12.5 MG tablet Take 12.5 mg by mouth 3 (three) times daily.  metoCLOPramide (REGLAN) 10 MG tablet Take by mouth.     montelukast (SINGULAIR) 10 MG tablet Take 1 tablet (10 mg total) by mouth at bedtime. 30 tablet 3   Multiple Vitamins-Minerals (MULTIVITAMIN ADULT PO) Take 1 tablet by mouth daily.     omeprazole (PRILOSEC) 40 MG capsule Take 1 capsule (40 mg total) by mouth in the morning and at bedtime. 60 capsule 2   promethazine (PHENERGAN) 25 MG tablet Take 1 tablet (25 mg total) by mouth every 6 (six) hours as needed for nausea or vomiting. 30 tablet 0   sucralfate (CARAFATE) 1 g tablet Take 1 tablet (1 g total) by mouth 3 (three) times daily as needed. prn 90 tablet 1   tiZANidine (ZANAFLEX) 4 MG tablet Take 1 tablet (4 mg total) by mouth every 6 (six) hours as needed for muscle spasms. 30 tablet 0   topiramate (TOPAMAX) 50 MG tablet Take 1 tablet (50 mg total) by mouth 2 (two) times daily. 60 tablet 3   triamcinolone cream (KENALOG) 0.1 % Apply topically 2 (two) times daily.     zinc sulfate 220 (50 Zn) MG capsule Take 220 mg by mouth 2 (two) times daily.     zolpidem (AMBIEN) 5 MG tablet Take 1 tablet (5 mg total) by mouth at bedtime as needed for sleep. 15 tablet 1   albuterol (VENTOLIN HFA) 108 (90 Base) MCG/ACT inhaler Inhale 2 puffs into the lungs every 6 (six) hours as needed for wheezing or shortness of breath. 18 g 5   azithromycin (ZITHROMAX) 250 MG tablet 2 tabs po qd x 1 day; 1 tablet per day x 4 days; 6 tablet 0   sulfamethoxazole-trimethoprim (BACTRIM DS) 800-160 MG tablet Take 1 tablet by mouth 2 (two) times daily.     clotrimazole (GYNE-LOTRIMIN 3) 2 % vaginal cream Place 1 Applicatorful vaginally at bedtime. (Patient not taking: Reported on 10/30/2021) 21 g 0   cyclobenzaprine (FLEXERIL) 10  MG tablet Take 1 tablet (10 mg total) by mouth at bedtime. (Patient not taking: Reported on 10/30/2021) 7 tablet 0   Ferrous Fumarate-Folic Acid 710-6 MG TABS Take 324 mg by mouth daily. (Patient not taking: Reported on 10/30/2021) 90 tablet 1   fluticasone (FLONASE) 50 MCG/ACT nasal spray Place 2 sprays into both nostrils daily. 16 g 0   hydrOXYzine (ATARAX/VISTARIL) 25 MG tablet Take 25 mg by mouth 3 (three) times daily. (Patient not taking: Reported on 10/30/2021)     cholestyramine (QUESTRAN) 4 g packet Take 1 packet by mouth 2 (two) times daily. (Patient not taking: Reported on 10/30/2021)     losartan (COZAAR) 25 MG tablet Take 25 mg by mouth. (Patient not taking: Reported on 10/30/2021)     No facility-administered medications prior to visit.    Allergies  Allergen Reactions   Benazepril Anaphylaxis, Swelling and Hives    angioedema Throat and lip swelling   Ondansetron Hcl Hives    Redness and hives post IV admin on 07/05/17   Codeine Nausea And Vomiting   Morphine Hives    Redness and hives noted post IV admin on 07/05/17    Review of Systems  Constitutional:  Positive for malaise/fatigue. Negative for fever.  HENT:  Positive for congestion.   Eyes:  Negative for blurred vision.  Respiratory:  Positive for cough and shortness of breath.   Cardiovascular:  Negative for chest pain, palpitations and leg swelling.  Gastrointestinal:  Positive for abdominal pain, diarrhea and nausea. Negative for blood in stool, constipation, heartburn,  melena and vomiting.  Genitourinary:  Negative for dysuria and frequency.  Musculoskeletal:  Positive for myalgias. Negative for falls.  Skin:  Negative for rash.  Neurological:  Negative for dizziness, loss of consciousness and headaches.  Endo/Heme/Allergies:  Negative for environmental allergies.  Psychiatric/Behavioral:  Positive for depression. The patient is nervous/anxious.       Objective:    Physical Exam Constitutional:       General: She is not in acute distress.    Appearance: She is well-developed.  HENT:     Head: Normocephalic and atraumatic.  Eyes:     Conjunctiva/sclera: Conjunctivae normal.  Neck:     Thyroid: No thyromegaly.  Cardiovascular:     Rate and Rhythm: Normal rate and regular rhythm.     Heart sounds: Normal heart sounds. No murmur heard. Pulmonary:     Effort: Pulmonary effort is normal. No respiratory distress.     Breath sounds: Normal breath sounds.  Abdominal:     General: Bowel sounds are normal. There is no distension.     Palpations: Abdomen is soft. There is no mass.     Tenderness: There is abdominal tenderness. There is no guarding or rebound.  Musculoskeletal:     Cervical back: Neck supple.  Lymphadenopathy:     Cervical: No cervical adenopathy.  Skin:    General: Skin is warm and dry.  Neurological:     Mental Status: She is alert and oriented to person, place, and time.  Psychiatric:        Behavior: Behavior normal.    BP 128/80    Pulse 96    Temp 97.8 F (36.6 C)    Resp 16    Ht $R'5\' 6"'li$  (1.676 m)    Wt 195 lb (88.5 kg)    SpO2 95%    BMI 31.47 kg/m  Wt Readings from Last 3 Encounters:  11/20/21 195 lb (88.5 kg)  11/11/21 195 lb (88.5 kg)  11/04/21 196 lb 6.9 oz (89.1 kg)    Diabetic Foot Exam - Simple   No data filed    Lab Results  Component Value Date   WBC 2.8 (L) 11/11/2021   HGB 11.9 (L) 11/11/2021   HCT 36.9 11/11/2021   PLT 120 (L) 11/11/2021   GLUCOSE 102 (H) 11/11/2021   CHOL 184 09/16/2021   TRIG 163.0 (H) 09/16/2021   HDL 42.00 09/16/2021   LDLCALC 109 (H) 09/16/2021   ALT 33 11/11/2021   AST 49 (H) 11/11/2021   NA 141 11/11/2021   K 4.1 11/11/2021   CL 107 11/11/2021   CREATININE 0.64 11/11/2021   BUN 16 11/11/2021   CO2 26 11/11/2021   TSH 1.51 09/16/2021   INR 0.90 07/28/2018   HGBA1C 6.0 09/16/2021    Lab Results  Component Value Date   TSH 1.51 09/16/2021   Lab Results  Component Value Date   WBC 2.8 (L) 11/11/2021    HGB 11.9 (L) 11/11/2021   HCT 36.9 11/11/2021   MCV 89.6 11/11/2021   PLT 120 (L) 11/11/2021   Lab Results  Component Value Date   NA 141 11/11/2021   K 4.1 11/11/2021   CHLORIDE 108 12/31/2016   CO2 26 11/11/2021   GLUCOSE 102 (H) 11/11/2021   BUN 16 11/11/2021   CREATININE 0.64 11/11/2021   BILITOT 0.3 11/11/2021   ALKPHOS 70 11/11/2021   AST 49 (H) 11/11/2021   ALT 33 11/11/2021   PROT 8.2 (H) 11/11/2021   ALBUMIN 3.9 11/11/2021  CALCIUM 9.5 11/11/2021   ANIONGAP 8 11/11/2021   EGFR >90 12/31/2016   GFR 93.72 09/16/2021   Lab Results  Component Value Date   CHOL 184 09/16/2021   Lab Results  Component Value Date   HDL 42.00 09/16/2021   Lab Results  Component Value Date   LDLCALC 109 (H) 09/16/2021   Lab Results  Component Value Date   TRIG 163.0 (H) 09/16/2021   Lab Results  Component Value Date   CHOLHDL 4 09/16/2021   Lab Results  Component Value Date   HGBA1C 6.0 09/16/2021       Assessment & Plan:   Problem List Items Addressed This Visit     Hypertension    Well controlled, no changes to meds. Encouraged heart healthy diet such as the DASH diet and exercise as tolerated.       Relevant Medications   cholestyramine (QUESTRAN) 4 g packet   SOB (shortness of breath)    Is maintained on Breo and stable.       Elevated sed rate    Her opthamologist has put her on Prednisone 60 mg daily but she is not sure why, will request records      Diarrhea    Patient continues to have frequent diarrheal episodes daily. Was recently hospitalized and no CDiff was found. Will try a course of Lomotil and Questran while she waits on her appointment      Hyperglycemia    hgba1c acceptable, minimize simple carbs. Increase exercise as tolerated.       I have discontinued Mardene Celeste A. [place]ck[place]'s cholestyramine, azithromycin, and sulfamethoxazole-trimethoprim. I am also having her start on cholestyramine, diphenoxylate-atropine, and fluticasone  furoate-vilanterol. Additionally, I am having her maintain her aspirin EC, Multiple Vitamins-Minerals (MULTIVITAMIN ADULT PO), topiramate, fluticasone, cyclobenzaprine, meclizine, magnesium oxide, Vitamin D-3, carvedilol, furosemide, sucralfate, acetaminophen, budesonide-formoterol, montelukast, diphenhydrAMINE, triamcinolone cream, zolpidem, dicyclomine, clotrimazole, zinc sulfate, losartan, hydrOXYzine, omeprazole, promethazine, celecoxib, ibuprofen, HYDROcodone-acetaminophen, Ferrous Fumarate-Folic Acid, loratadine, tiZANidine, metoCLOPramide, acyclovir, loperamide, and albuterol.  Meds ordered this encounter  Medications   cholestyramine (QUESTRAN) 4 g packet    Sig: Take 1 packet (4 g total) by mouth 2 (two) times daily.    Dispense:  60 each    Refill:  3   diphenoxylate-atropine (LOMOTIL) 2.5-0.025 MG tablet    Sig: Take 1 tablet by mouth 4 (four) times daily as needed for diarrhea or loose stools.    Dispense:  40 tablet    Refill:  0   fluticasone furoate-vilanterol (BREO ELLIPTA) 100-25 MCG/ACT AEPB    Sig: Inhale 1 puff into the lungs daily.    Dispense:  1 each    Refill:  11   albuterol (VENTOLIN HFA) 108 (90 Base) MCG/ACT inhaler    Sig: Inhale 2 puffs into the lungs every 6 (six) hours as needed for wheezing or shortness of breath.    Dispense:  18 g    Refill:  5     Penni Homans, MD

## 2021-11-23 NOTE — Assessment & Plan Note (Signed)
Her opthamologist has put her on Prednisone 60 mg daily but she is not sure why, will request records

## 2021-11-23 NOTE — Assessment & Plan Note (Signed)
Patient continues to have frequent diarrheal episodes daily. Was recently hospitalized and no CDiff was found. Will try a course of Lomotil and Questran while she waits on her appointment

## 2021-11-23 NOTE — Assessment & Plan Note (Signed)
hgba1c acceptable, minimize simple carbs. Increase exercise as tolerated.  

## 2021-11-23 NOTE — Assessment & Plan Note (Signed)
Well controlled, no changes to meds. Encouraged heart healthy diet such as the DASH diet and exercise as tolerated.  °

## 2021-11-23 NOTE — Assessment & Plan Note (Signed)
Is maintained on Breo and stable.

## 2021-11-24 ENCOUNTER — Telehealth: Payer: Self-pay | Admitting: Family Medicine

## 2021-11-24 NOTE — Telephone Encounter (Signed)
Pt is scheduled for tomorrow 

## 2021-11-24 NOTE — Telephone Encounter (Signed)
Pt stated she fell last weekend and has been taking tylenol to help with the pain on her head. She did say she had been feeling slightly dizzy, scheduled 1/17 and transferred to triage. Please advise.

## 2021-11-25 ENCOUNTER — Encounter: Payer: Self-pay | Admitting: Family Medicine

## 2021-11-25 ENCOUNTER — Ambulatory Visit (INDEPENDENT_AMBULATORY_CARE_PROVIDER_SITE_OTHER): Payer: Medicare Other | Admitting: Family Medicine

## 2021-11-25 VITALS — BP 132/68 | HR 69 | Temp 97.8°F | Resp 16 | Ht 66.0 in | Wt 195.0 lb

## 2021-11-25 DIAGNOSIS — H539 Unspecified visual disturbance: Secondary | ICD-10-CM | POA: Diagnosis not present

## 2021-11-25 DIAGNOSIS — R197 Diarrhea, unspecified: Secondary | ICD-10-CM | POA: Diagnosis not present

## 2021-11-25 DIAGNOSIS — E785 Hyperlipidemia, unspecified: Secondary | ICD-10-CM

## 2021-11-25 DIAGNOSIS — I10 Essential (primary) hypertension: Secondary | ICD-10-CM

## 2021-11-25 DIAGNOSIS — R739 Hyperglycemia, unspecified: Secondary | ICD-10-CM | POA: Diagnosis not present

## 2021-11-25 DIAGNOSIS — R519 Headache, unspecified: Secondary | ICD-10-CM | POA: Diagnosis not present

## 2021-11-25 MED ORDER — PROMETHAZINE HCL 25 MG PO TABS: 25.0000 mg | ORAL_TABLET | Freq: Three times a day (TID) | ORAL | 1 refills | Status: DC | PRN

## 2021-11-25 NOTE — Patient Instructions (Addendum)
Ibuprofen 400 mg (2 of the OTC tabs) is the best pain control for the least side effects but if no response can try 600 or 800 mg but will have more potential side effects. Take with food Can take a percocet after trying Ibuprofen if needed.   Migraine Headache A migraine headache is a very strong throbbing pain on one side or both sides of your head. This type of headache can also cause other symptoms. It can last from 4 hours to 3 days. Talk with your doctor about what things may bring on (trigger) this condition. What are the causes? The exact cause of this condition is not known. This condition may be triggered or caused by: Drinking alcohol. Smoking. Taking medicines, such as: Medicine used to treat chest pain (nitroglycerin). Birth control pills. Estrogen. Some blood pressure medicines. Eating or drinking certain products. Doing physical activity. Other things that may trigger a migraine headache include: Having a menstrual period. Pregnancy. Hunger. Stress. Not getting enough sleep or getting too much sleep. Weather changes. Tiredness (fatigue). What increases the risk? Being 9-22 years old. Being female. Having a family history of migraine headaches. Being Caucasian. Having depression or anxiety. Being very overweight. What are the signs or symptoms? A throbbing pain. This pain may: Happen in any area of the head, such as on one side or both sides. Make it hard to do daily activities. Get worse with physical activity. Get worse around bright lights or loud noises. Other symptoms may include: Feeling sick to your stomach (nauseous). Vomiting. Dizziness. Being sensitive to bright lights, loud noises, or smells. Before you get a migraine headache, you may get warning signs (an aura). An aura may include: Seeing flashing lights or having blind spots. Seeing bright spots, halos, or zigzag lines. Having tunnel vision or blurred vision. Having numbness or a tingling  feeling. Having trouble talking. Having weak muscles. Some people have symptoms after a migraine headache (postdromal phase), such as: Tiredness. Trouble thinking (concentrating). How is this treated? Taking medicines that: Relieve pain. Relieve the feeling of being sick to your stomach. Prevent migraine headaches. Treatment may also include: Having acupuncture. Avoiding foods that bring on migraine headaches. Learning ways to control your body functions (biofeedback). Therapy to help you know and deal with negative thoughts (cognitive behavioral therapy). Follow these instructions at home: Medicines Take over-the-counter and prescription medicines only as told by your doctor. Ask your doctor if the medicine prescribed to you: Requires you to avoid driving or using heavy machinery. Can cause trouble pooping (constipation). You may need to take these steps to prevent or treat trouble pooping: Drink enough fluid to keep your pee (urine) pale yellow. Take over-the-counter or prescription medicines. Eat foods that are high in fiber. These include beans, whole grains, and fresh fruits and vegetables. Limit foods that are high in fat and sugar. These include fried or sweet foods. Lifestyle Do not drink alcohol. Do not use any products that contain nicotine or tobacco, such as cigarettes, e-cigarettes, and chewing tobacco. If you need help quitting, ask your doctor. Get at least 8 hours of sleep every night. Limit and deal with stress. General instructions   Keep a journal to find out what may bring on your migraine headaches. For example, write down: What you eat and drink. How much sleep you get. Any change in what you eat or drink. Any change in your medicines. If you have a migraine headache: Avoid things that make your symptoms worse, such as bright lights.  It may help to lie down in a dark, quiet room. Do not drive or use heavy machinery. Ask your doctor what activities are  safe for you. Keep all follow-up visits as told by your doctor. This is important. Contact a doctor if: You get a migraine headache that is different or worse than others you have had. You have more than 15 headache days in one month. Get help right away if: Your migraine headache gets very bad. Your migraine headache lasts longer than 72 hours. You have a fever. You have a stiff neck. You have trouble seeing. Your muscles feel weak or like you cannot control them. You start to lose your balance a lot. You start to have trouble walking. You pass out (faint). You have a seizure. Summary A migraine headache is a very strong throbbing pain on one side or both sides of your head. These headaches can also cause other symptoms. This condition may be treated with medicines and changes to your lifestyle. Keep a journal to find out what may bring on your migraine headaches. Contact a doctor if you get a migraine headache that is different or worse than others you have had. Contact your doctor if you have more than 15 headache days in a month. This information is not intended to replace advice given to you by your health care provider. Make sure you discuss any questions you have with your health care provider. Document Revised: 02/17/2019 Document Reviewed: 12/08/2018 Elsevier Patient Education  2022 [place]. Temporal Arteritis Temporal arteritis is a condition that causes arteries to become inflamed. It usually affects arteries in your head and face, but arteries in any part of the body can become inflamed. The condition is also called giant cell arteritis.  Temporal arteritis can cause serious problems such as blindness. Early treatment can help prevent these problems. What are the causes? The cause of this condition is not known. What increases the risk? The following factors may make you more likely to develop this condition: Being older than 50. Being a woman. Being  Caucasian. Being of [place], [place], [place], [place] or [place] ancestry. Having a family history of the condition. Having a certain condition that causes muscle pain and stiffness (polymyalgia rheumatica, PMR). What are the signs or symptoms? Some people with temporal arteritis have just one symptom, while others have several symptoms. Most symptoms are related to the head and face. These may include: Headache. Hard, swollen, or tender temples. This is common. Your temples are the areas on either side of your forehead. Pain when combing your hair or when laying your head down. Pain in the jaw when chewing. Pain in the throat or tongue. Problems with your vision, such as sudden loss of vision in one eye, or seeing double. Other symptoms may include: Fever. Tiredness (fatigue). A dry cough. Pain in the hips and shoulders. Pain in the arms during exercise. Depression. Weight loss. How is this diagnosed? This condition may be diagnosed based on: Your symptoms. Your medical history. A physical exam. Tests, including: Blood tests. A test in which a tissue sample is removed from an artery so it can be examined (biopsy). Imaging tests, such as an ultrasound or MRI. How is this treated? This condition may be treated with: A type of medicine to reduce inflammation (corticosteroid). Medicines to weaken your immune system (immunosuppressants). Other medicines to treat vision problems. You will need to see your health care provider while you are being treated. The medicines used to treat this  condition can increase your risk of problems such as bone loss and diabetes. During follow-up visits, your health care provider will check for problems by: Doing blood tests and bone density tests. Checking your blood pressure and blood sugar. Follow these instructions at home: Medicines Take over-the-counter and prescription medicines only as told by your health care provider. Take any  vitamins or supplements recommended by your health care provider. These may include vitamin D and calcium, which help keep your bones from becoming weak. Eating and drinking  Eat a heart-healthy diet. This may include: Eating high-fiber foods, such as fresh fruits and vegetables, whole grains, and beans. Eating heart-healthy fats (omega-3 fats), such as fish, flaxseed, and flaxseed oil. Limiting foods that are high in saturated fat and cholesterol, such as processed and fried foods, fatty meat, and full-fat dairy. Limiting how much salt (sodium) you eat. Include calcium and vitamin D in your diet. Good sources of calcium and vitamin D include: Low-fat dairy products such as milk, yogurt, and cheese. Certain fish, such as fresh or canned salmon, tuna, and sardines. Products that have calcium and vitamin D added to them (fortified products), such as fortified cereals or juice. General instructions Exercise. Talk with your health care provider about what exercises are okay for you. Exercises that increase your heart rate (aerobic exercise), such as walking, are often recommended. Aerobic exercise helps control your blood pressure and prevent bone loss. Stay up to date on all vaccines as directed by your health care provider. Keep all follow-up visits as told by your health care provider. This is important. Contact a health care provider if: Your symptoms get worse. You develop signs of infection, such as fever, swelling, redness, warmth, and tenderness. Get help right away if: You lose your vision. Your pain does not go away, even after you take medicine. You have chest pain. You have trouble breathing. One side of your face or body suddenly becomes weak or numb. These symptoms may represent a serious problem that is an emergency. Do not wait to see if the symptoms will go away. Get medical help right away. Call your local emergency services (911 in the [place] Do not drive yourself to the  hospital. Summary Temporal arteritis is a condition that causes arteries to become inflamed. It usually affects arteries in your head and face. This condition can cause serious problems, such as blindness. Treatment can help prevent these problems. Symptoms may include hard or tender temples, pain in your jaw when chewing, problems with your vision, or pain in your hips and shoulders. Take over-the-counter and prescription medicines as told by your health care provider. This information is not intended to replace advice given to you by your health care provider. Make sure you discuss any questions you have with your health care provider. Document Revised: 01/07/2021 Document Reviewed: 01/07/2021 Elsevier Patient Education  [place]. Temporal Artery Biopsy Arteries are blood vessels that carry blood from the heart to the rest of the body. Temporal arteries are found on the side of the head and between the ears and eyes. During a temporal artery biopsy, a sample of the temporal artery is removed and examined under a microscope. This procedure may be done to see if you have a condition that causes your arteries to become swollen or inflamed (temporal arteritis or giant cell arteritis). Tell a health care provider about: Any allergies you have. All medicines you are taking, including vitamins, herbs, eye drops, creams, and over-the-counter medicines. Any problems you  or family members have had with anesthetic medicines. Any blood disorders you have. Any surgeries you have had. Any medical conditions you have or have had. Whether you are pregnant or may be pregnant. What are the risks? Generally, this is a safe procedure. However, problems may occur, including: Bleeding. A collection of blood under the skin (hematoma). Infection. Nerve damage in the temples. This can cause numbness or make the muscles in your face weak. Scarring. On the scalp, hair may not grow around the scar. What  happens before the procedure? You may have blood tests to make sure that your blood clots normally. Ask your health care provider about: Changing or stopping your regular medicines. This is especially important if you are taking diabetes medicines or blood thinners. Taking medicines such as aspirin and ibuprofen. These medicines can thin your blood. Do not take these medicines unless your health care provider tells you to take them. Taking over-the-counter medicines, vitamins, herbs, and supplements. Follow instructions from your health care provider about eating or drinking restrictions. Plan to have someone take you home from the hospital or clinic. If you will be going home right after the procedure, plan to have someone with you for 24 hours. Ask your health care provider: How your surgery site will be marked. What steps will be taken to help prevent infection. These may include: Removing hair at the surgery site. Washing skin with a germ-killing soap. Taking antibiotic medicine. What happens during the procedure? Small monitors will be put on your body. They will be used to check your heart, blood pressure, and oxygen level. An IV will be inserted into one of your veins. You will be given one or more of the following: A medicine to help you relax (sedative). A medicine to numb the area (local anesthetic). A tool called a Doppler may be used to find the artery. An incision will be made over the temporal artery. Two clamps will be placed on the artery. Then, a small piece of the artery between the clamps will be cut and removed. The remaining ends of the artery will be closed with stitches (sutures) to avoid bleeding. The clamps will then be removed. The incision will be closed with sutures. A bandage (dressing) may be placed on the incision. The artery piece that was taken out will be checked under a microscope. The procedure may vary among health care providers and hospitals. What  happens after the procedure? Your blood pressure, heart rate, breathing rate, and blood oxygen level may be monitored until you leave the hospital or clinic. You may be given medicine for pain. Do not drive for 24 hours if you were given a sedative during your procedure. Summary During a temporal artery biopsy, a sample of the temporal artery is removed and examined under a microscope. This procedure may be done to see if you have a condition that causes your arteries to become swollen or inflamed (temporal arteritis or giant cell arteritis). Before the procedure, follow instructions from your health care provider about changing or stopping your medicines and about any eating or drinking restrictions. Plan to have someone take you home after the procedure and have someone with you for 24 hours. During the procedure, the incision will be closed with sutures, and a bandage (dressing) may be placed on the incision. This information is not intended to replace advice given to you by your health care provider. Make sure you discuss any questions you have with your health care provider. Document Revised:  01/07/2021 Document Reviewed: 01/07/2021 Elsevier Patient Education  [employer] [employer].

## 2021-11-26 DIAGNOSIS — Z8669 Personal history of other diseases of the nervous system and sense organs: Secondary | ICD-10-CM | POA: Diagnosis not present

## 2021-11-26 DIAGNOSIS — H539 Unspecified visual disturbance: Secondary | ICD-10-CM | POA: Insufficient documentation

## 2021-11-26 DIAGNOSIS — H35713 Central serous chorioretinopathy, bilateral: Secondary | ICD-10-CM | POA: Diagnosis not present

## 2021-11-26 DIAGNOSIS — R519 Headache, unspecified: Secondary | ICD-10-CM | POA: Diagnosis not present

## 2021-11-26 DIAGNOSIS — Z79899 Other long term (current) drug therapy: Secondary | ICD-10-CM | POA: Diagnosis not present

## 2021-11-26 DIAGNOSIS — H31011 Macula scars of posterior pole (postinflammatory) (post-traumatic), right eye: Secondary | ICD-10-CM | POA: Diagnosis not present

## 2021-11-26 DIAGNOSIS — G8929 Other chronic pain: Secondary | ICD-10-CM | POA: Diagnosis not present

## 2021-11-26 DIAGNOSIS — H25813 Combined forms of age-related cataract, bilateral: Secondary | ICD-10-CM | POA: Diagnosis not present

## 2021-11-26 DIAGNOSIS — H04123 Dry eye syndrome of bilateral lacrimal glands: Secondary | ICD-10-CM | POA: Diagnosis not present

## 2021-11-26 NOTE — Assessment & Plan Note (Signed)
Has responded well to Cholestyramine daily and has needed Lomotil infrequently

## 2021-11-26 NOTE — Assessment & Plan Note (Signed)
Well controlled, no changes to meds. Encouraged heart healthy diet such as the DASH diet and exercise as tolerated.  °

## 2021-11-26 NOTE — Assessment & Plan Note (Signed)
Encourage heart healthy diet such as MIND or DASH diet, increase exercise, avoid trans fats, simple carbohydrates and processed foods, consider a krill or fish or flaxseed oil cap daily.  °

## 2021-11-26 NOTE — Progress Notes (Signed)
Subjective:    [name], female    DOB: November 11, 1955, 66 y.o.   MRN: [id number]  Chief Complaint  Patient presents with   Fall   Headache    HPI Patient is in today for follow up on chronic medical concerns. No recent febrile illness or hospitalizations. No polyuria or polydipsia. She notes her diarrhea has responded to the Cholestyramine. Has not really needed Lomotil. She is upset due to her need for a biopsy at [facility] tomorrow but after explaining why to her she is some better. Denies CP/palp/SOB/HA/congestion/fevers/GI or GU c/o. Taking meds as prescribed   Past Medical History:  Diagnosis Date   Abnormal SPEP 07/26/2016   Arthritis    knees, hands   Back pain 07/14/2016   Bowel obstruction (HCC) 11/2014   C. 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   Multiple myeloma (HCC) 07/20/2018   Multiple myeloma not having achieved remission (HCC) 07/20/2018   Myalgia 12/31/2016   Obstructive sleep apnea 03/31/2015   SOB (shortness of breath) 04/09/2016   Stroke (HCC)    TIAs   UTI (urinary tract infection)     Past Surgical History:  Procedure Laterality Date   ABDOMINAL HYSTERECTOMY     menorraghia, 2006, total   CHOLECYSTECTOMY     COLONOSCOPY  2018   [facility] per patient   ESOPHAGOGASTRODUODENOSCOPY  2018   [facility]    KNEE SURGERY     right, repair torn torn cartialage   PACEMAKER INSERTION  08/2014   TONSILLECTOMY     TUBAL LIGATION      Family History  Problem Relation Age of Onset   Diabetes Mother    Hypertension Mother    Heart disease  Mother        s/p 1 stent   Hyperlipidemia Mother    Arthritis Mother    Cancer Father        COLON   Colon cancer Father 35   Irritable bowel syndrome Sister    Hyperlipidemia Daughter    Hypertension Daughter    Hypertension Maternal Grandmother    Arthritis Maternal Grandmother    Heart disease Maternal Grandfather        MI   Hypertension Maternal Grandfather    Arthritis Maternal Grandfather    Hypertension Son    Esophageal cancer Neg Hx     Social History   Socioeconomic History   Marital status: Divorced    Spouse name: [name]   Number of children: 3   Years of education: [name]   Highest education level: High school graduate  Occupational History   [name]  Tobacco Use   Smoking status: Never   Smokeless tobacco: Never  Vaping Use   Vaping Use: Never used  Substance and Sexual Activity   Alcohol use: No   Drug use: No   Sexual activity: [name]  Other Topics Concern   [name]  Social History Narrative   Lives at home alone   Retired   Caffeine:  coffee   Social Determinants of Health   Financial Resource Strain: Low Risk    Difficulty of Paying Living Expenses: Not hard at all  Food Insecurity: No Food Insecurity   Worried About Charity fundraiser in the Last Year: Never true   Ran Out of Food in the Last Year: Never true  Transportation Needs: No Transportation Needs   Lack of Transportation (Medical): No   Lack of Transportation (Non-Medical): No  Physical Activity: Insufficiently Active   Days of Exercise per Week: 2 days   Minutes of Exercise per Session: 30 min  Stress: No Stress Concern Present   Feeling of Stress : Not at all  Social Connections: Moderately Isolated   Frequency of Communication with Friends and Family: More than three times a week   Frequency of Social Gatherings with Friends and Family: More than three times a week   Attends Religious Services: More than 4 times per year   Active Member of [employer]  or Organizations: No   Attends Archivist Meetings: Never   Marital Status: Divorced  Human resources officer Violence: Not At Risk   Fear of Current or Ex-Partner: No   Emotionally Abused: No   Physically Abused: No   Sexually Abused: No    Outpatient Medications Prior to Visit  Medication Sig Dispense Refill   acetaminophen (TYLENOL) 500 MG tablet Take 500 mg by mouth every 6 (six) hours as needed.     acyclovir (ZOVIRAX) 400 MG tablet TAKE ONE (1) TABLET BY MOUTH TWO (2) TIMES DAILY 60 tablet 1   albuterol (VENTOLIN HFA) 108 (90 Base) MCG/ACT inhaler Inhale 2 puffs into the lungs every 6 (six) hours as needed for wheezing or shortness of breath. 18 g 5   aspirin EC 81 MG tablet Take 1 tablet (81 mg total) by mouth daily. 90 tablet 1   budesonide-formoterol (SYMBICORT) 160-4.5 MCG/ACT inhaler Inhale 2 puffs into the lungs 2 (two) times daily. 1 each 3   carvedilol (COREG) 25 MG tablet Take 1 tablet (25 mg total) by mouth 2 (two) times daily with a meal. 180 tablet 1   celecoxib (CELEBREX) 100 MG capsule Take 1 capsule (100 mg total) by mouth 2 (two) times daily as needed for mild pain or moderate pain (with food). 60 capsule 3   Cholecalciferol (VITAMIN D-3) 25 MCG (1000 UT) CAPS Take 1 capsule (1,000 Units total) by mouth daily. 60 capsule 2   cholestyramine (QUESTRAN) 4 g packet Take 1 packet (4 g total) by mouth 2 (two) times daily. 60 each 3   dicyclomine (BENTYL) 10 MG capsule Take 1 tab every 6 hours as needed for abdominal cramping. 60 capsule 0   diphenhydrAMINE (BENADRYL) 25 mg capsule TAKE ONE (1) CAPSULE BY MOUTH THREE (3) TIMES EACH DAY AS NEEDED     diphenoxylate-atropine (LOMOTIL) 2.5-0.025 MG tablet Take 1 tablet by mouth 4 (four) times daily as needed for diarrhea or loose stools. 40 tablet 0   fluticasone (FLONASE) 50 MCG/ACT nasal spray Place 2 sprays into both nostrils daily. 16 g 0   fluticasone furoate-vilanterol (BREO ELLIPTA) 100-25 MCG/ACT AEPB Inhale 1 puff into  the lungs daily. 1 each 11   furosemide (LASIX) 20 MG tablet Take 1 tablet by mouth as directed.     HYDROcodone-acetaminophen (NORCO/VICODIN) 5-325 MG tablet Take 1 tablet by mouth every 6 (six) hours as needed. 6 tablet 0   ibuprofen (ADVIL) 800 MG tablet Take 1 tablet (800 mg total) by mouth 3 (  three) times daily. 21 tablet 0   loperamide (IMODIUM) 2 MG capsule Take 2 mg by mouth.     loratadine (CLARITIN) 10 MG tablet Take 1 tablet (10 mg total) by mouth daily. 30 tablet 11   losartan (COZAAR) 50 MG tablet Take 75 mg by mouth daily.     magnesium oxide (MAG-OX) 400 MG tablet Take 1 tablet (400 mg total) by mouth 2 (two) times daily. 60 tablet 1   meclizine (ANTIVERT) 12.5 MG tablet Take 12.5 mg by mouth 3 (three) times daily.     metoCLOPramide (REGLAN) 10 MG tablet Take by mouth.     montelukast (SINGULAIR) 10 MG tablet Take 1 tablet (10 mg total) by mouth at bedtime. 30 tablet 3   Multiple Vitamins-Minerals (MULTIVITAMIN ADULT PO) Take 1 tablet by mouth daily.     omeprazole (PRILOSEC) 40 MG capsule Take 1 capsule (40 mg total) by mouth in the morning and at bedtime. 60 capsule 2   sucralfate (CARAFATE) 1 g tablet Take 1 tablet (1 g total) by mouth 3 (three) times daily as needed. prn 90 tablet 1   tiZANidine (ZANAFLEX) 4 MG tablet Take 1 tablet (4 mg total) by mouth every 6 (six) hours as needed for muscle spasms. 30 tablet 0   topiramate (TOPAMAX) 50 MG tablet Take 1 tablet (50 mg total) by mouth 2 (two) times daily. 60 tablet 3   triamcinolone cream (KENALOG) 0.1 % Apply topically 2 (two) times daily.     zinc sulfate 220 (50 Zn) MG capsule Take 220 mg by mouth 2 (two) times daily.     zolpidem (AMBIEN) 5 MG tablet Take 1 tablet (5 mg total) by mouth at bedtime as needed for sleep. 15 tablet 1   promethazine (PHENERGAN) 25 MG tablet Take 1 tablet (25 mg total) by mouth every 6 (six) hours as needed for nausea or vomiting. 30 tablet 0   clotrimazole (GYNE-LOTRIMIN 3) 2 % vaginal cream  Place 1 Applicatorful vaginally at bedtime. (Patient not taking: Reported on 10/30/2021) 21 g 0   cyclobenzaprine (FLEXERIL) 10 MG tablet Take 1 tablet (10 mg total) by mouth at bedtime. (Patient not taking: Reported on 10/30/2021) 7 tablet 0   Ferrous Fumarate-Folic Acid 324-1 MG TABS Take 324 mg by mouth daily. (Patient not taking: Reported on 10/30/2021) 90 tablet 1   hydrOXYzine (ATARAX/VISTARIL) 25 MG tablet Take 25 mg by mouth 3 (three) times daily. (Patient not taking: Reported on 10/30/2021)     No facility-administered medications prior to visit.    Allergies  Allergen Reactions   Benazepril Anaphylaxis, Swelling and Hives    angioedema Throat and lip swelling   Ondansetron Hcl Hives    Redness and hives post IV admin on 07/05/17   Codeine Nausea And Vomiting   Morphine Hives    Redness and hives noted post IV admin on 07/05/17    Review of Systems  Constitutional:  Positive for malaise/fatigue. Negative for fever.  HENT:  Negative for congestion.   Eyes:  Positive for blurred vision.  Respiratory:  Negative for shortness of breath.   Cardiovascular:  Negative for chest pain, palpitations and leg swelling.  Gastrointestinal:  Positive for diarrhea. Negative for abdominal pain, blood in stool, constipation, melena and nausea.  Genitourinary:  Negative for dysuria and frequency.  Musculoskeletal:  Negative for falls.  Skin:  Negative for rash.  Neurological:  Positive for headaches. Negative for dizziness and loss of consciousness.  Endo/Heme/Allergies:  Negative for environmental allergies.  Psychiatric/Behavioral:  Negative for depression. The patient is not nervous/anxious.       Objective:    Physical Exam Constitutional:      General: She is not in acute distress.    Appearance: She is well-developed.  HENT:     Head: Normocephalic and atraumatic.  Eyes:     Conjunctiva/sclera: Conjunctivae normal.  Neck:     Thyroid: No thyromegaly.  Cardiovascular:      Rate and Rhythm: Normal rate and regular rhythm.     Heart sounds: Normal heart sounds. No murmur heard. Pulmonary:     Effort: Pulmonary effort is normal. No respiratory distress.     Breath sounds: Normal breath sounds.  Abdominal:     General: Bowel sounds are normal. There is no distension.     Palpations: Abdomen is soft. There is no mass.     Tenderness: There is no abdominal tenderness.  Musculoskeletal:     Cervical back: Neck supple.  Lymphadenopathy:     Cervical: No cervical adenopathy.  Skin:    General: Skin is warm and dry.  Neurological:     Mental Status: She is alert and oriented to person, place, and time.  Psychiatric:        Behavior: Behavior normal.    BP 132/68    Pulse 69    Temp 97.8 F (36.6 C)    Resp 16    Ht $R'5\' 6"'mF$  (1.676 m)    Wt 195 lb (88.5 kg)    SpO2 96%    BMI 31.47 kg/m  Wt Readings from Last 3 Encounters:  11/25/21 195 lb (88.5 kg)  11/20/21 195 lb (88.5 kg)  11/11/21 195 lb (88.5 kg)    Diabetic Foot Exam - Simple   No data filed    Lab Results  Component Value Date   WBC 2.8 (L) 11/11/2021   HGB 11.9 (L) 11/11/2021   HCT 36.9 11/11/2021   PLT 120 (L) 11/11/2021   GLUCOSE 102 (H) 11/11/2021   CHOL 184 09/16/2021   TRIG 163.0 (H) 09/16/2021   HDL 42.00 09/16/2021   LDLCALC 109 (H) 09/16/2021   ALT 33 11/11/2021   AST 49 (H) 11/11/2021   NA 141 11/11/2021   K 4.1 11/11/2021   CL 107 11/11/2021   CREATININE 0.64 11/11/2021   BUN 16 11/11/2021   CO2 26 11/11/2021   TSH 1.51 09/16/2021   INR 0.90 07/28/2018   HGBA1C 6.0 09/16/2021    Lab Results  Component Value Date   TSH 1.51 09/16/2021   Lab Results  Component Value Date   WBC 2.8 (L) 11/11/2021   HGB 11.9 (L) 11/11/2021   HCT 36.9 11/11/2021   MCV 89.6 11/11/2021   PLT 120 (L) 11/11/2021   Lab Results  Component Value Date   NA 141 11/11/2021   K 4.1 11/11/2021   CHLORIDE 108 12/31/2016   CO2 26 11/11/2021   GLUCOSE 102 (H) 11/11/2021   BUN 16 11/11/2021    CREATININE 0.64 11/11/2021   BILITOT 0.3 11/11/2021   ALKPHOS 70 11/11/2021   AST 49 (H) 11/11/2021   ALT 33 11/11/2021   PROT 8.2 (H) 11/11/2021   ALBUMIN 3.9 11/11/2021   CALCIUM 9.5 11/11/2021   ANIONGAP 8 11/11/2021   EGFR >90 12/31/2016   GFR 93.72 09/16/2021   Lab Results  Component Value Date   CHOL 184 09/16/2021   Lab Results  Component Value Date   HDL 42.00 09/16/2021   Lab Results  Component  Value Date   LDLCALC 109 (H) 09/16/2021   Lab Results  Component Value Date   TRIG 163.0 (H) 09/16/2021   Lab Results  Component Value Date   CHOLHDL 4 09/16/2021   Lab Results  Component Value Date   HGBA1C 6.0 09/16/2021       Assessment & Plan:   Problem List Items Addressed This Visit     Hyperlipidemia    Encourage heart healthy diet such as MIND or DASH diet, increase exercise, avoid trans fats, simple carbohydrates and processed foods, consider a krill or fish or flaxseed oil cap daily.       Hypertension    Well controlled, no changes to meds. Encouraged heart healthy diet such as the DASH diet and exercise as tolerated.       Diarrhea    Has responded well to Cholestyramine daily and has needed Lomotil infrequently      Headache - Primary   Relevant Orders   Ambulatory referral to Neurology   Hyperglycemia    hgba1c acceptable, minimize simple carbs. Increase exercise as tolerated.       Visual changes    With elevated sed rate and some headache/tenderness in temples. Her opthamologist has started her on Prednisone 60 mg daily and set her up with a temporal artery biopsy at [facility] tomorrow. She had misunderstood their concern and reported she was expecting a brain biopsy so she ws offered reassurance that this was a much smaller procedure.        I have discontinued Mardene Celeste A. Hildreth's promethazine. I am also having her start on promethazine. Additionally, I am having her maintain her aspirin EC, Multiple Vitamins-Minerals (MULTIVITAMIN ADULT  PO), topiramate, fluticasone, cyclobenzaprine, meclizine, magnesium oxide, Vitamin D-3, carvedilol, furosemide, sucralfate, acetaminophen, budesonide-formoterol, montelukast, diphenhydrAMINE, triamcinolone cream, zolpidem, dicyclomine, clotrimazole, zinc sulfate, losartan, hydrOXYzine, omeprazole, celecoxib, ibuprofen, HYDROcodone-acetaminophen, Ferrous Fumarate-Folic Acid, loratadine, tiZANidine, metoCLOPramide, acyclovir, loperamide, cholestyramine, diphenoxylate-atropine, fluticasone furoate-vilanterol, and albuterol.  Meds ordered this encounter  Medications   promethazine (PHENERGAN) 25 MG tablet    Sig: Take 1 tablet (25 mg total) by mouth every 8 (eight) hours as needed for nausea or vomiting (headache).    Dispense:  30 tablet    Refill:  1     Penni Homans, MD

## 2021-11-26 NOTE — Assessment & Plan Note (Signed)
With elevated sed rate and some headache/tenderness in temples. Her opthamologist has started her on Prednisone 60 mg daily and set her up with a temporal artery biopsy at [facility] tomorrow. She had misunderstood their concern and reported she was expecting a brain biopsy so she ws offered reassurance that this was a much smaller procedure.

## 2021-11-26 NOTE — Assessment & Plan Note (Signed)
hgba1c acceptable, minimize simple carbs. Increase exercise as tolerated.  

## 2021-12-01 ENCOUNTER — Other Ambulatory Visit: Payer: Medicare Other

## 2021-12-02 ENCOUNTER — Other Ambulatory Visit: Payer: Medicare Other

## 2021-12-02 DIAGNOSIS — H25813 Combined forms of age-related cataract, bilateral: Secondary | ICD-10-CM | POA: Diagnosis not present

## 2021-12-02 DIAGNOSIS — H04123 Dry eye syndrome of bilateral lacrimal glands: Secondary | ICD-10-CM | POA: Diagnosis not present

## 2021-12-02 DIAGNOSIS — M316 Other giant cell arteritis: Secondary | ICD-10-CM | POA: Diagnosis not present

## 2021-12-02 DIAGNOSIS — H31012 Macula scars of posterior pole (postinflammatory) (post-traumatic), left eye: Secondary | ICD-10-CM | POA: Diagnosis not present

## 2021-12-04 DIAGNOSIS — Z885 Allergy status to narcotic agent status: Secondary | ICD-10-CM | POA: Diagnosis not present

## 2021-12-04 DIAGNOSIS — G8929 Other chronic pain: Secondary | ICD-10-CM | POA: Diagnosis not present

## 2021-12-04 DIAGNOSIS — R519 Headache, unspecified: Secondary | ICD-10-CM | POA: Diagnosis not present

## 2021-12-04 DIAGNOSIS — M316 Other giant cell arteritis: Secondary | ICD-10-CM | POA: Diagnosis not present

## 2021-12-04 DIAGNOSIS — H16143 Punctate keratitis, bilateral: Secondary | ICD-10-CM | POA: Diagnosis not present

## 2021-12-04 DIAGNOSIS — H11153 Pinguecula, bilateral: Secondary | ICD-10-CM | POA: Diagnosis not present

## 2021-12-04 DIAGNOSIS — H04123 Dry eye syndrome of bilateral lacrimal glands: Secondary | ICD-10-CM | POA: Diagnosis not present

## 2021-12-04 DIAGNOSIS — H2513 Age-related nuclear cataract, bilateral: Secondary | ICD-10-CM | POA: Diagnosis not present

## 2021-12-04 DIAGNOSIS — H25813 Combined forms of age-related cataract, bilateral: Secondary | ICD-10-CM | POA: Diagnosis not present

## 2021-12-04 DIAGNOSIS — L7621 Postprocedural hemorrhage and hematoma of skin and subcutaneous tissue following a dermatologic procedure: Secondary | ICD-10-CM | POA: Diagnosis not present

## 2021-12-04 DIAGNOSIS — H31011 Macula scars of posterior pole (postinflammatory) (post-traumatic), right eye: Secondary | ICD-10-CM | POA: Diagnosis not present

## 2021-12-05 ENCOUNTER — Other Ambulatory Visit: Payer: Self-pay

## 2021-12-05 ENCOUNTER — Encounter: Payer: Self-pay | Admitting: Gastroenterology

## 2021-12-05 ENCOUNTER — Ambulatory Visit (INDEPENDENT_AMBULATORY_CARE_PROVIDER_SITE_OTHER): Payer: Medicare Other | Admitting: Gastroenterology

## 2021-12-05 ENCOUNTER — Other Ambulatory Visit (INDEPENDENT_AMBULATORY_CARE_PROVIDER_SITE_OTHER): Payer: Medicare Other

## 2021-12-05 VITALS — BP 140/88 | HR 80 | Ht 65.0 in | Wt 193.1 lb

## 2021-12-05 DIAGNOSIS — D649 Anemia, unspecified: Secondary | ICD-10-CM

## 2021-12-05 DIAGNOSIS — R112 Nausea with vomiting, unspecified: Secondary | ICD-10-CM | POA: Diagnosis not present

## 2021-12-05 DIAGNOSIS — K219 Gastro-esophageal reflux disease without esophagitis: Secondary | ICD-10-CM

## 2021-12-05 DIAGNOSIS — R1310 Dysphagia, unspecified: Secondary | ICD-10-CM

## 2021-12-05 DIAGNOSIS — R197 Diarrhea, unspecified: Secondary | ICD-10-CM | POA: Diagnosis not present

## 2021-12-05 DIAGNOSIS — K760 Fatty (change of) liver, not elsewhere classified: Secondary | ICD-10-CM

## 2021-12-05 LAB — COMPREHENSIVE METABOLIC PANEL
ALT: 28 U/L (ref 0–35)
AST: 21 U/L (ref 0–37)
Albumin: 3.6 g/dL (ref 3.5–5.2)
Alkaline Phosphatase: 53 U/L (ref 39–117)
BUN: 19 mg/dL (ref 6–23)
CO2: 29 mEq/L (ref 19–32)
Calcium: 9.1 mg/dL (ref 8.4–10.5)
Chloride: 105 mEq/L (ref 96–112)
Creatinine, Ser: 0.62 mg/dL (ref 0.40–1.20)
GFR: 92.09 mL/min (ref >60.00)
Glucose, Bld: 88 mg/dL (ref 70–99)
Potassium: 3.5 mEq/L (ref 3.5–5.1)
Sodium: 140 mEq/L (ref 135–145)
Total Bilirubin: 0.5 mg/dL (ref 0.2–1.2)
Total Protein: 7.9 g/dL (ref 6.0–8.3)

## 2021-12-05 LAB — CBC WITH DIFFERENTIAL/PLATELET
Basophils Absolute: 0 10*3/uL (ref 0.0–0.1)
Basophils Relative: 0.1 % (ref 0.0–3.0)
Eosinophils Absolute: 0 10*3/uL (ref 0.0–0.7)
Eosinophils Relative: 0.1 % (ref 0.0–5.0)
HCT: 38.2 % (ref 36.0–46.0)
Hemoglobin: 12.5 g/dL (ref 12.0–15.0)
Lymphocytes Relative: 22.1 % (ref 12.0–46.0)
Lymphs Abs: 1.2 10*3/uL (ref 0.7–4.0)
MCHC: 32.7 g/dL (ref 30.0–36.0)
MCV: 88.2 fl (ref 78.0–100.0)
Monocytes Absolute: 0.4 10*3/uL (ref 0.1–1.0)
Monocytes Relative: 6.9 % (ref 3.0–12.0)
Neutro Abs: 3.7 10*3/uL (ref 1.4–7.7)
Neutrophils Relative %: 70.8 % (ref 43.0–77.0)
Platelets: 109 10*3/uL — ABNORMAL LOW (ref 150.0–400.0)
RBC: 4.33 Mil/uL (ref 3.87–5.11)
RDW: 14.6 % (ref 11.5–15.5)
WBC: 5.3 10*3/uL (ref 4.0–10.5)

## 2021-12-05 LAB — MAGNESIUM: Magnesium: 2 mg/dL (ref 1.5–2.5)

## 2021-12-05 NOTE — Patient Instructions (Signed)
If you are age 68 or older, your body mass index should be between 23-30. Your Body mass index is 32.14 kg/m. If this is out of the aforementioned range listed, please consider follow up with your Primary Care Provider. __________________________________________________________  The [facility] providers would like to encourage you to use [facility] to communicate with providers for non-urgent requests or questions.  Due to long hold times on the telephone, sending your provider a message by [facility] may be a faster and more efficient way to get a response.  Please allow 48 business hours for a response.  Please remember that this is for non-urgent requests.    Due to recent changes in healthcare laws, you may see the results of your imaging and laboratory studies on MyChart before your provider has had a chance to review them.  We understand that in some cases there may be results that are confusing or concerning to you. Not all laboratory results come back in the same time frame and the provider may be waiting for multiple results in order to interpret others.  Please give [place] 48 hours in order for your provider to thoroughly review all the results before contacting the office for clarification of your results.   You have been scheduled for a gastric emptying scan at [facility] on 12/15/21 at 7.30am. Please arrive at least 15 minutes prior to your appointment for registration. Please make certain not to have anything to eat or drink after midnight the night before your test. Hold all stomach medications (ex: Zofran, phenergan, Reglan) 48 hours prior to your test. If you need to reschedule your appointment, please contact radiology scheduling at [phone]. _____________________________________________________________________ A gastric-emptying study measures how long it takes for food to move through your stomach. There are several ways to measure stomach emptying. In the most common test, you eat  food that contains a small amount of radioactive material. A scanner that detects the movement of the radioactive material is placed over your abdomen to monitor the rate at which food leaves your stomach. This test normally takes about 4 hours to complete. _____________________________________________________________________   Please go to the lab on the 2nd floor [address] before you leave the office today.   Please follow up in 6 months. Give [place] a call at [phone] to schedule an appointment.  Please continue taking your PPI.    It was a pleasure to see you today!  Jackquline Denmark, M.D.

## 2021-12-05 NOTE — Progress Notes (Signed)
Chief Complaint: FU  Referring Provider:  Mosie Lukes, MD      ASSESSMENT AND PLAN;   #1. Functional dyspepsia  #2. IBS-D. Previous H/O constipation. Neg colon with TI intubation and random colon Bx. Neg stool studies, multiple CTs.  Could have bile acid diarrhea (s/p cholecystectomy) or d/t meds.  #3. Fatty liver with Nl LFTs.  No liver cirrhosis.  #3.  Multiple comorbid conditions include anxiety, multiple myeloma, HTN, CHF  s/p AICD, OSA, LBBB, H/O TIA.  Plan:  -Protonix 40 bid to continue for now. -CBC, CMP, Mg. -Reasonable to repeat solid phase GES -Continue cholestyramine 4g po QD -Stool studies for GI Pathogen (includes C. Diff), fecal elastase and Calprotectin (if any further diarrhea).  Continue magnesium as per cardiology. -I have again discussed with patient that she needs to follow-up with Dr. Dorrene German who knows her well-rather than having multiple gastroenterologists.   HPI:    Tami [name] is a 66 y.o. female  With CHF s/p AICD, MM, HTN, OSA, LBBB, H/O TIA, migraine HA's, S/P recent right temporal biopsy to rule out temporal arteritis on empiric steroids  Followed by Dr. Reather Laurence.  Has been to multiple gastroenterologists and with multiple ED visits.  Chronic N/V/Abdo pain, neg EGD with gastric and SB Bx 09/18/2021.  Recent Adm to [facility] 10/2021 with N/V/D.  Negative stool studies including C. difficile.  Neg CTAP, Colon with TI with neg random colon biopsies 11/07/21.  C/O postprandial upper abdominal pain and "feels like food sitting in the stomach".  Has nausea but no vomiting.   Told me that she is not having any more diarrhea despite magnesium as prescribed by cardiology.  No melena or hematochezia.  After eating feels like food is sitting there  EGD@ before Thxgvn Colon 11/07/2021 by Doing much better from GI standpoint. No further diarrhea.  She is currently having normal bowel movements.  Constipation is not that bad.   Past GI  work-up:  EGD 09/18/2021 (Dr Dorrene German): Normal.  Negative gastric and small bowel biopsies for celiac.  Prev: H/O eso stricture s/p EGD with dil 58Fr 07/30/2017 (Dr Reather Laurence), Bx- neg EoE/neg [facility]. Rpt EGD 07/14/2018: neg.   Colonoscopy with TI intubation 11/07/2021 Dr Dorene Grebe.  Negative with negative random colon biopsies. Med internal hemorrhoids.  CT AP without contrast 10/16/2021: neg  GES 07/2021: neg  UGI series 06/13/2021: neg  Past Medical History:  Diagnosis Date   Abnormal SPEP 07/26/2016   Arthritis    knees, hands   Back pain 07/14/2016   Bowel obstruction ([place]) 11/2014   C. 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[facility]) 09/16/2016   Multiple myeloma ([place]) 07/20/2018   Multiple myeloma not having achieved remission ([place]) 07/20/2018   Myalgia 12/31/2016   Obstructive sleep apnea 03/31/2015   SOB (shortness of breath) 04/09/2016   Stroke ([place])    TIAs   UTI (urinary tract infection)     Past Surgical History:  Procedure Laterality Date   ABDOMINAL HYSTERECTOMY     menorraghia, 2006, total   CHOLECYSTECTOMY  COLONOSCOPY  2018   [facility] per patient   ESOPHAGOGASTRODUODENOSCOPY  2018   [facility]    KNEE SURGERY     right, repair torn torn cartialage   PACEMAKER INSERTION  08/2014   TONSILLECTOMY     TUBAL LIGATION      Family History  Problem Relation Age of Onset   Diabetes Mother    Hypertension Mother    Heart disease Mother        s/p 1 stent   Hyperlipidemia Mother    Arthritis Mother    Cancer Father        COLON   Colon cancer Father 52   Irritable bowel  syndrome Sister    Hyperlipidemia Daughter    Hypertension Daughter    Hypertension Maternal Grandmother    Arthritis Maternal Grandmother    Heart disease Maternal Grandfather        MI   Hypertension Maternal Grandfather    Arthritis Maternal Grandfather    Hypertension Son    Esophageal cancer Neg Hx     Social History   Tobacco Use   Smoking status: Never   Smokeless tobacco: Never  Vaping Use   Vaping Use: Never used  Substance Use Topics   Alcohol use: No   Drug use: No  . Allergies as of 12/05/2021       Reactions   Benazepril Anaphylaxis, Swelling, Hives   angioedema Throat and lip swelling   Ondansetron Hcl Hives   Redness and hives post IV admin on 07/05/17   Codeine Nausea And Vomiting   Morphine Hives   Redness and hives noted post IV admin on 07/05/17        Medication List        Accurate as of December 05, 2021 10:31 AM. If you have any questions, ask your nurse or doctor.          STOP taking these medications    clotrimazole 2 % vaginal cream Commonly known as: GYNE-LOTRIMIN 3 Stopped by: Jackquline Denmark, MD   zolpidem 5 MG tablet Commonly known as: AMBIEN Stopped by: Jackquline Denmark, MD       TAKE these medications    acetaminophen 500 MG tablet Commonly known as: TYLENOL Take 500 mg by mouth every 6 (six) hours as needed.   acyclovir 400 MG tablet Commonly known as: ZOVIRAX TAKE ONE (1) TABLET BY MOUTH TWO (2) TIMES DAILY   albuterol 108 (90 Base) MCG/ACT inhaler Commonly known as: VENTOLIN HFA Inhale 2 puffs into the lungs every 6 (six) hours as needed for wheezing or shortness of breath.   aspirin EC 81 MG tablet Take 1 tablet (81 mg total) by mouth daily.   budesonide-formoterol 160-4.5 MCG/ACT inhaler Commonly known as: SYMBICORT Inhale 2 puffs into the lungs as needed.   carvedilol 25 MG tablet Commonly known as: COREG Take 1 tablet (25 mg total) by mouth 2 (two) times daily with a meal.   celecoxib 100 MG  capsule Commonly known as: CeleBREX Take 1 capsule (100 mg total) by mouth 2 (two) times daily as needed for mild pain or moderate pain (with food).   cholestyramine 4 g packet Commonly known as: Questran Take 1 packet (4 g total) by mouth 2 (two) times daily.   cyclobenzaprine 10 MG tablet Commonly known as: FLEXERIL Take 1 tablet (10 mg total) by mouth at bedtime. What changed:  when to take this reasons to take this   dicyclomine 10 MG capsule Commonly known  as: BENTYL Take 1 tab every 6 hours as needed for abdominal cramping.   diphenhydrAMINE 25 mg capsule Commonly known as: BENADRYL TAKE ONE (1) CAPSULE BY MOUTH THREE (3) TIMES EACH DAY AS NEEDED   diphenoxylate-atropine 2.5-0.025 MG tablet Commonly known as: Lomotil Take 1 tablet by mouth 4 (four) times daily as needed for diarrhea or loose stools.   erythromycin ophthalmic ointment 1 application QID.   Ferrous Fumarate-Folic Acid 510-2 MG Tabs Take 324 mg by mouth daily.   fluticasone 50 MCG/ACT nasal spray Commonly known as: FLONASE Place 2 sprays into both nostrils daily. What changed:  when to take this reasons to take this   fluticasone furoate-vilanterol 100-25 MCG/ACT Aepb Commonly known as: BREO ELLIPTA Inhale 1 puff into the lungs daily. What changed:  when to take this reasons to take this   furosemide 20 MG tablet Commonly known as: LASIX Take 1 tablet by mouth as needed.   HYDROcodone-acetaminophen 5-325 MG tablet Commonly known as: NORCO/VICODIN Take 1 tablet by mouth every 6 (six) hours as needed.   hydrOXYzine 25 MG tablet Commonly known as: ATARAX Take 25 mg by mouth as needed.   ibuprofen 800 MG tablet Commonly known as: ADVIL Take 1 tablet (800 mg total) by mouth 3 (three) times daily. What changed:  when to take this reasons to take this   loperamide 2 MG capsule Commonly known as: IMODIUM Take 2 mg by mouth as needed.   loratadine 10 MG tablet Commonly known as:  CLARITIN Take 1 tablet (10 mg total) by mouth daily.   losartan 50 MG tablet Commonly known as: COZAAR Take 50 mg by mouth daily.   magnesium oxide 400 MG tablet Commonly known as: MAG-OX Take 1 tablet (400 mg total) by mouth 2 (two) times daily.   meclizine 12.5 MG tablet Commonly known as: ANTIVERT Take 12.5 mg by mouth as needed.   metoCLOPramide 10 MG tablet Commonly known as: REGLAN Take 10 mg by mouth as needed.   montelukast 10 MG tablet Commonly known as: Singulair Take 1 tablet (10 mg total) by mouth at bedtime. What changed:  when to take this reasons to take this   MULTIVITAMIN ADULT PO Take 1 tablet by mouth daily.   omeprazole 40 MG capsule Commonly known as: PRILOSEC Take 1 capsule (40 mg total) by mouth in the morning and at bedtime.   predniSONE 20 MG tablet Commonly known as: DELTASONE Take 60 mg by mouth daily.   promethazine 25 MG tablet Commonly known as: PHENERGAN Take 1 tablet (25 mg total) by mouth every 8 (eight) hours as needed for nausea or vomiting (headache).   sucralfate 1 g tablet Commonly known as: Carafate Take 1 tablet (1 g total) by mouth 3 (three) times daily as needed. prn   tiZANidine 4 MG tablet Commonly known as: Zanaflex Take 1 tablet (4 mg total) by mouth every 6 (six) hours as needed for muscle spasms.   topiramate 50 MG tablet Commonly known as: Topamax Take 1 tablet (50 mg total) by mouth 2 (two) times daily.   triamcinolone cream 0.1 % Commonly known as: KENALOG Apply 1 application topically as needed.   Vitamin D-3 25 MCG (1000 UT) Caps Take 1 capsule (1,000 Units total) by mouth daily.   zinc sulfate 220 (50 Zn) MG capsule Take 220 mg by mouth 2 (two) times daily.          Allergies  Allergen Reactions   Benazepril Anaphylaxis, Swelling and Hives  angioedema Throat and lip swelling   Ondansetron Hcl Hives    Redness and hives post IV admin on 07/05/17   Codeine Nausea And Vomiting   Morphine  Hives    Redness and hives noted post IV admin on 07/05/17    Review of Systems:  Psychiatric/Behavioral: Has anxiety or depression     Physical Exam:    BP 140/88    Pulse 80    Ht _0  (1.651 m)    Wt 193 lb 2 oz (87.6 kg)    BMI 32.14 kg/m  Filed Weights   12/05/21 1015  Weight: 193 lb 2 oz (87.6 kg)   Constitutional:  Well-developed, in no acute distress. Psychiatric: Normal mood and affect. Behavior is normal. HEENT: Pupils normal.  Conjunctivae are normal. No scleral icterus. Cardiovascular: Normal rate, regular rhythm. No edema Pulmonary/chest: Effort normal and breath sounds normal. No wheezing, rales or rhonchi. Abdominal: Soft, nondistended. Nontender. Bowel sounds active throughout. There are no masses palpable. No hepatomegaly. Rectal:  defered Neurological: Alert and oriented to person place and time. Skin: Skin is warm and dry. No rashes noted.   Carmell Austria, MD 12/05/2021, 10:31 AM  Cc: Mosie Lukes, MD

## 2021-12-06 LAB — IRON,TIBC AND FERRITIN PANEL
%SAT: 36 % (calc) (ref 16–45)
Ferritin: 578 ng/mL — ABNORMAL HIGH (ref 16–288)
Iron: 128 ug/dL (ref 45–160)
TIBC: 351 mcg/dL (calc) (ref 250–450)

## 2021-12-08 ENCOUNTER — Other Ambulatory Visit: Payer: Medicare Other

## 2021-12-08 DIAGNOSIS — K219 Gastro-esophageal reflux disease without esophagitis: Secondary | ICD-10-CM | POA: Diagnosis not present

## 2021-12-08 DIAGNOSIS — R197 Diarrhea, unspecified: Secondary | ICD-10-CM | POA: Diagnosis not present

## 2021-12-08 DIAGNOSIS — R1310 Dysphagia, unspecified: Secondary | ICD-10-CM

## 2021-12-08 DIAGNOSIS — K760 Fatty (change of) liver, not elsewhere classified: Secondary | ICD-10-CM | POA: Diagnosis not present

## 2021-12-10 ENCOUNTER — Other Ambulatory Visit: Payer: Self-pay

## 2021-12-10 ENCOUNTER — Telehealth: Payer: Self-pay

## 2021-12-10 ENCOUNTER — Ambulatory Visit (HOSPITAL_BASED_OUTPATIENT_CLINIC_OR_DEPARTMENT_OTHER)
Admission: RE | Admit: 2021-12-10 | Discharge: 2021-12-10 | Disposition: A | Discharge status: Home / Self Care | Payer: Medicare Other | Source: Ambulatory Visit | Attending: Family | Admitting: Family

## 2021-12-10 ENCOUNTER — Ambulatory Visit (INDEPENDENT_AMBULATORY_CARE_PROVIDER_SITE_OTHER): Payer: Medicare Other | Admitting: Family

## 2021-12-10 VITALS — BP 148/79 | HR 100 | Temp 98.4°F | Resp 16 | Ht 66.0 in | Wt 193.4 lb

## 2021-12-10 DIAGNOSIS — R519 Headache, unspecified: Secondary | ICD-10-CM | POA: Insufficient documentation

## 2021-12-10 DIAGNOSIS — R42 Dizziness and giddiness: Secondary | ICD-10-CM | POA: Insufficient documentation

## 2021-12-10 DIAGNOSIS — J029 Acute pharyngitis, unspecified: Secondary | ICD-10-CM

## 2021-12-10 LAB — GI PROFILE, STOOL, PCR

## 2021-12-10 LAB — CLOSTRIDIUM DIFFICILE BY PCR: Toxigenic C. Difficile by PCR: NEGATIVE

## 2021-12-10 LAB — CALPROTECTIN, FECAL: Calprotectin, Fecal: <16 ug/g (ref 0–120)

## 2021-12-10 NOTE — Patient Instructions (Signed)
We will contact you with the results of your CT scan and throat swab. Please go to ER if you develop one sided weakness, numbness, increased dizziness.

## 2021-12-10 NOTE — Telephone Encounter (Signed)
-----   Message from Mosie Lukes, MD sent at 12/01/2021 12:44 PM EST ----- Please let patient know we are struggling to get her  neuro appt. Is she willing to go back to [facility] or [facility] I might be able to get those appointments faster? ----- Message ----- From: Melvenia Beam, MD Sent: 11/30/2021  10:47 PM EST To: Mosie Lukes, MD  [place], you referred patient to [place] for migraines. We last saw her in 12/2019 for migraine but she never followed up and looks like she was seeing a lot of neurologists at the same time - she was at [facility] neurolog[facility] last in 05/2020 and [facility] last in 02/2020 for migraines too. Are you sure she wants to come back to [place] because it appears she never followed back up with [place] and then saw multiple neurologists after [place]? I was wondering if your office might check if she really wants to see [place] or [facility] or [facility] since looks like she was established there after seeing [place].  Our waiting list is long, we are trying to get patients in as fast as we can, so I am reviewing all referrals now. Or we can call and ask her.  Thanks

## 2021-12-10 NOTE — Telephone Encounter (Signed)
Lvm to call back

## 2021-12-10 NOTE — Progress Notes (Signed)
Subjective:   By signing my name below, I, [name], attest that this documentation has been prepared under the direction and in the presence of Debbrah Alar, NP 12/10/2021   [name], female    DOB: [birth year], 66 y.o.   MRN: [id number]  Chief Complaint  Patient presents with   Headache    Here for headache onset for 4 days     HPI Patient is in today for an office visit.    Headaches-  She had a biopsy of her temporal artery  and will be getting the results tomorrow. She reports that since her biopsy, the headaches have improved. She had to go back because the site of her biopsy was bleeding. She is taking 60 mg prednisone.  Sore Throat- She reports she has had a a sore throat and nasal drainage since Sunday and it is very uncomfortable. She has been gargling with Listerine.   Dizziness- She reports she has been feeling dizzy especially with movement. She often has to stop for a few seconds to regain her balance. She also has numbness in her feet but think it is from her new medication. Adds that her balance has been off since Sunday.  Dyspnea- She reports she has occasional dyspnea but adds she has a pacemaker.  Past Medical History:  Diagnosis Date   Abnormal SPEP 07/26/2016   Arthritis    knees, hands   Back pain 07/14/2016   Bowel obstruction ([place]) 11/2014   C. 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   Multiple myeloma ([place]) 07/20/2018   Multiple myeloma not having achieved remission ([place]) 07/20/2018   Myalgia 12/31/2016   Obstructive sleep apnea 03/31/2015    SOB (shortness of breath) 04/09/2016   Stroke ([place])    TIAs   UTI (urinary tract infection)     Past Surgical History:  Procedure Laterality Date   ABDOMINAL HYSTERECTOMY     menorraghia, 2006, total   CHOLECYSTECTOMY     COLONOSCOPY  2018   [facility] per patient   ESOPHAGOGASTRODUODENOSCOPY  2018   [facility]    KNEE SURGERY     right, repair torn torn cartialage   PACEMAKER INSERTION  08/2014   TONSILLECTOMY     TUBAL LIGATION      Family History  Problem Relation Age of Onset   Diabetes Mother    Hypertension Mother    Heart disease Mother        s/p 1 stent   Hyperlipidemia Mother    Arthritis Mother    Cancer Father        COLON   Colon cancer Father 29   Irritable bowel syndrome Sister    Hyperlipidemia Daughter    Hypertension Daughter    Hypertension Maternal Grandmother    Arthritis Maternal Grandmother    Heart disease Maternal Grandfather        MI   Hypertension Maternal Grandfather    Arthritis Maternal Grandfather    Hypertension Son    Esophageal cancer Neg Hx  Social History   Socioeconomic History   Marital status: Divorced    Spouse name: [name]   Number of children: 3   Years of education: [name]   Highest education level: High school graduate  Occupational History   [name]  Tobacco Use   Smoking status: Never   Smokeless tobacco: Never  Vaping Use   Vaping Use: Never used  Substance and Sexual Activity   Alcohol use: No   Drug use: No   Sexual activity: [name]  Other Topics Concern   [name]  Social History Narrative   Lives at home alone   Retired   Caffeine: coffee   Social Determinants of Health   Financial Resource Strain: Low Risk    Difficulty of Paying Living Expenses: Not hard at all  Food Insecurity: No Food Insecurity   Worried About Charity fundraiser in the Last Year: Never true   Arboriculturist in the Last Year: Never true  Transportation Needs: No  Transportation Needs   Lack of Transportation (Medical): No   Lack of Transportation (Non-Medical): No  Physical Activity: Insufficiently Active   Days of Exercise per Week: 2 days   Minutes of Exercise per Session: 30 min  Stress: No Stress Concern Present   Feeling of Stress : Not at all  Social Connections: Moderately Isolated   Frequency of Communication with Friends and Family: More than three times a week   Frequency of Social Gatherings with Friends and Family: More than three times a week   Attends Religious Services: More than 4 times per year   Active Member of [employer] or Organizations: No   Attends Archivist Meetings: Never   Marital Status: Divorced  Human resources officer Violence: Not At Risk   Fear of Current or Ex-Partner: No   Emotionally Abused: No   Physically Abused: No   Sexually Abused: No    Outpatient Medications Prior to Visit  Medication Sig Dispense Refill   acetaminophen (TYLENOL) 500 MG tablet Take 500 mg by mouth every 6 (six) hours as needed.     acyclovir (ZOVIRAX) 400 MG tablet TAKE ONE (1) TABLET BY MOUTH TWO (2) TIMES DAILY 60 tablet 1   albuterol (VENTOLIN HFA) 108 (90 Base) MCG/ACT inhaler Inhale 2 puffs into the lungs every 6 (six) hours as needed for wheezing or shortness of breath. 18 g 5   aspirin EC 81 MG tablet Take 1 tablet (81 mg total) by mouth daily. 90 tablet 1   budesonide-formoterol (SYMBICORT) 160-4.5 MCG/ACT inhaler Inhale 2 puffs into the lungs as needed. 1 each 3   carvedilol (COREG) 25 MG tablet Take 1 tablet (25 mg total) by mouth 2 (two) times daily with a meal. 180 tablet 1   celecoxib (CELEBREX) 100 MG capsule Take 1 capsule (100 mg total) by mouth 2 (two) times daily as needed for mild pain or moderate pain (with food). 60 capsule 3   Cholecalciferol (VITAMIN D-3) 25 MCG (1000 UT) CAPS Take 1 capsule (1,000 Units total) by mouth daily. 60 capsule 2   cholestyramine (QUESTRAN) 4 g packet Take 1 packet (4 g total) by mouth 2  (two) times daily. 60 each 3   cyclobenzaprine (FLEXERIL) 10 MG tablet Take 1 tablet (10 mg total) by mouth at bedtime. (Patient taking differently: Take 10 mg by mouth as needed.) 7 tablet 0   dicyclomine (BENTYL) 10 MG capsule Take 1 tab every 6 hours  as needed for abdominal cramping. 60 capsule 0   diphenhydrAMINE (BENADRYL) 25 mg capsule TAKE ONE (1) CAPSULE BY MOUTH THREE (3) TIMES EACH DAY AS NEEDED     diphenoxylate-atropine (LOMOTIL) 2.5-0.025 MG tablet Take 1 tablet by mouth 4 (four) times daily as needed for diarrhea or loose stools. 40 tablet 0   erythromycin ophthalmic ointment 1 application QID.     Ferrous Fumarate-Folic Acid 037-0 MG TABS Take 324 mg by mouth daily. 90 tablet 1   fluticasone (FLONASE) 50 MCG/ACT nasal spray Place 2 sprays into both nostrils daily. (Patient taking differently: Place 2 sprays into both nostrils as needed.) 16 g 0   fluticasone furoate-vilanterol (BREO ELLIPTA) 100-25 MCG/ACT AEPB Inhale 1 puff into the lungs daily. (Patient taking differently: Inhale 1 puff into the lungs as needed.) 1 each 11   furosemide (LASIX) 20 MG tablet Take 1 tablet by mouth as needed.     HYDROcodone-acetaminophen (NORCO/VICODIN) 5-325 MG tablet Take 1 tablet by mouth every 6 (six) hours as needed. 6 tablet 0   hydrOXYzine (ATARAX/VISTARIL) 25 MG tablet Take 25 mg by mouth as needed.     ibuprofen (ADVIL) 800 MG tablet Take 1 tablet (800 mg total) by mouth 3 (three) times daily. (Patient taking differently: Take 800 mg by mouth as needed.) 21 tablet 0   loperamide (IMODIUM) 2 MG capsule Take 2 mg by mouth as needed.     loratadine (CLARITIN) 10 MG tablet Take 1 tablet (10 mg total) by mouth daily. 30 tablet 11   losartan (COZAAR) 50 MG tablet Take 50 mg by mouth daily.     magnesium oxide (MAG-OX) 400 MG tablet Take 1 tablet (400 mg total) by mouth 2 (two) times daily. 60 tablet 1   meclizine (ANTIVERT) 12.5 MG tablet Take 12.5 mg by mouth as needed.     metoCLOPramide  (REGLAN) 10 MG tablet Take 10 mg by mouth as needed.     montelukast (SINGULAIR) 10 MG tablet Take 1 tablet (10 mg total) by mouth at bedtime. (Patient taking differently: Take 10 mg by mouth as needed.) 30 tablet 3   Multiple Vitamins-Minerals (MULTIVITAMIN ADULT PO) Take 1 tablet by mouth daily.     omeprazole (PRILOSEC) 40 MG capsule Take 1 capsule (40 mg total) by mouth in the morning and at bedtime. 60 capsule 2   predniSONE (DELTASONE) 20 MG tablet Take 60 mg by mouth daily.     promethazine (PHENERGAN) 25 MG tablet Take 1 tablet (25 mg total) by mouth every 8 (eight) hours as needed for nausea or vomiting (headache). 30 tablet 1   sucralfate (CARAFATE) 1 g tablet Take 1 tablet (1 g total) by mouth 3 (three) times daily as needed. prn 90 tablet 1   tiZANidine (ZANAFLEX) 4 MG tablet Take 1 tablet (4 mg total) by mouth every 6 (six) hours as needed for muscle spasms. 30 tablet 0   topiramate (TOPAMAX) 50 MG tablet Take 1 tablet (50 mg total) by mouth 2 (two) times daily. 60 tablet 3   triamcinolone cream (KENALOG) 0.1 % Apply 1 application topically as needed.     zinc sulfate 220 (50 Zn) MG capsule Take 220 mg by mouth 2 (two) times daily.     No facility-administered medications prior to visit.    Allergies  Allergen Reactions   Benazepril Anaphylaxis, Swelling and Hives    angioedema Throat and lip swelling   Ondansetron Hcl Hives    Redness and hives post IV admin on  07/05/17   Codeine Nausea And Vomiting   Morphine Hives    Redness and hives noted post IV admin on 07/05/17    Review of Systems  Constitutional:  Negative for fever.  HENT:  Positive for sore throat. Negative for ear pain and hearing loss.        (+) nasal drainage (+) difficulty swallowing    Eyes:  Negative for blurred vision.  Respiratory:  Positive for shortness of breath. Negative for cough and wheezing.   Cardiovascular:  Negative for chest pain and leg swelling.  Gastrointestinal:  Negative for blood  in stool, diarrhea, nausea and vomiting.  Genitourinary:  Negative for dysuria and frequency.  Musculoskeletal:  Negative for joint pain and myalgias.  Skin:  Negative for rash.  Neurological:  Positive for dizziness and headaches.       (+) loss of balance   Psychiatric/Behavioral:  Negative for depression. The patient is not nervous/anxious.       Objective:    Physical Exam Constitutional:      General: She is not in acute distress.    Appearance: Normal appearance. She is not ill-appearing.  HENT:     Head: Normocephalic and atraumatic.     Right Ear: External ear normal.     Left Ear: External ear normal.     Mouth/Throat:     Mouth: Mucous membranes are moist.     Pharynx: Oropharynx is clear. No pharyngeal swelling, oropharyngeal exudate or posterior oropharyngeal erythema.  Eyes:     Extraocular Movements: Extraocular movements intact.     Pupils: Pupils are equal, round, and reactive to light.  Cardiovascular:     Rate and Rhythm: Normal rate and regular rhythm.     Pulses: Normal pulses.     Heart sounds: Normal heart sounds. No murmur heard. Pulmonary:     Effort: Pulmonary effort is normal. No respiratory distress.     Breath sounds: Normal breath sounds. No wheezing or rhonchi.  Abdominal:     General: Bowel sounds are normal. There is no distension.     Palpations: Abdomen is soft.     Tenderness: There is no abdominal tenderness. There is no guarding or rebound.  Musculoskeletal:     Cervical back: Neck supple.  Lymphadenopathy:     Cervical: No cervical adenopathy.  Skin:    General: Skin is warm and dry.  Neurological:     Mental Status: She is alert and oriented to person, place, and time.     Comments: 5/5 strength in upper and lower extremities   Psychiatric:        Behavior: Behavior normal.        Judgment: Judgment normal.    BP (!) 148/79 (BP Location: Right Arm, Patient Position: Sitting, Cuff Size: Small)    Pulse 100    Temp 98.4 F (36.9  C) (Oral)    Resp 16    Ht 5\' 6"  (1.676 m)    Wt 193 lb 6.4 oz (87.7 kg)    SpO2 99%    BMI 31.22 kg/m  Wt Readings from Last 3 Encounters:  12/10/21 193 lb 6.4 oz (87.7 kg)  12/05/21 193 lb 2 oz (87.6 kg)  11/25/21 195 lb (88.5 kg)    Diabetic Foot Exam - Simple   No data filed    Lab Results  Component Value Date   WBC 5.3 12/05/2021   HGB 12.5 12/05/2021   HCT 38.2 12/05/2021   PLT 109.0 (L) 12/05/2021  GLUCOSE 88 12/05/2021   CHOL 184 09/16/2021   TRIG 163.0 (H) 09/16/2021   HDL 42.00 09/16/2021   LDLCALC 109 (H) 09/16/2021   ALT 28 12/05/2021   AST 21 12/05/2021   NA 140 12/05/2021   K 3.5 12/05/2021   CL 105 12/05/2021   CREATININE 0.62 12/05/2021   BUN 19 12/05/2021   CO2 29 12/05/2021   TSH 1.51 09/16/2021   INR 0.90 07/28/2018   HGBA1C 6.0 09/16/2021    Lab Results  Component Value Date   TSH 1.51 09/16/2021   Lab Results  Component Value Date   WBC 5.3 12/05/2021   HGB 12.5 12/05/2021   HCT 38.2 12/05/2021   MCV 88.2 12/05/2021   PLT 109.0 (L) 12/05/2021   Lab Results  Component Value Date   NA 140 12/05/2021   K 3.5 12/05/2021   CHLORIDE 108 12/31/2016   CO2 29 12/05/2021   GLUCOSE 88 12/05/2021   BUN 19 12/05/2021   CREATININE 0.62 12/05/2021   BILITOT 0.5 12/05/2021   ALKPHOS 53 12/05/2021   AST 21 12/05/2021   ALT 28 12/05/2021   PROT 7.9 12/05/2021   ALBUMIN 3.6 12/05/2021   CALCIUM 9.1 12/05/2021   ANIONGAP 8 11/11/2021   EGFR >90 12/31/2016   GFR 92.09 12/05/2021   Lab Results  Component Value Date   CHOL 184 09/16/2021   Lab Results  Component Value Date   HDL 42.00 09/16/2021   Lab Results  Component Value Date   LDLCALC 109 (H) 09/16/2021   Lab Results  Component Value Date   TRIG 163.0 (H) 09/16/2021   Lab Results  Component Value Date   CHOLHDL 4 09/16/2021   Lab Results  Component Value Date   HGBA1C 6.0 09/16/2021       Assessment & Plan:   Problem List Items Addressed This Visit        Unprioritized   Sore throat    I suspect that this is due to post nasal drip. Will send of strep culture to be sure.       Relevant Orders   Culture, Group A Strep   Headache    Need to rule out CVA or other brain pathology.       Relevant Orders   CT HEAD WO CONTRAST (5MM) (Completed)   Dizziness - Primary    Need to rule out CVA, if negative for CVA, then most likely related to BPPV.       Relevant Orders   CT HEAD WO CONTRAST (5MM) (Completed)     No orders of the defined types were placed in this encounter.    I,[name],acting as a Education administrator for [employer], NP.,have documented all relevant documentation on the behalf of Nance Pear, NP,as directed by  Nance Pear, NP while in the presence of Nance Pear, NP.  I, Debbrah Alar, NP , personally preformed the services described in this documentation.  All medical record entries made by the scribe were at my direction and in my presence.  I have reviewed the chart and discharge instructions (if applicable) and agree that the record reflects my personal performance and is accurate and complete. 12/10/2021

## 2021-12-11 DIAGNOSIS — Z7952 Long term (current) use of systemic steroids: Secondary | ICD-10-CM | POA: Diagnosis not present

## 2021-12-11 DIAGNOSIS — H04123 Dry eye syndrome of bilateral lacrimal glands: Secondary | ICD-10-CM | POA: Diagnosis not present

## 2021-12-11 DIAGNOSIS — H31011 Macula scars of posterior pole (postinflammatory) (post-traumatic), right eye: Secondary | ICD-10-CM | POA: Diagnosis not present

## 2021-12-11 DIAGNOSIS — M316 Other giant cell arteritis: Secondary | ICD-10-CM | POA: Diagnosis not present

## 2021-12-11 DIAGNOSIS — H25813 Combined forms of age-related cataract, bilateral: Secondary | ICD-10-CM | POA: Diagnosis not present

## 2021-12-11 DIAGNOSIS — R519 Headache, unspecified: Secondary | ICD-10-CM | POA: Diagnosis not present

## 2021-12-11 DIAGNOSIS — R42 Dizziness and giddiness: Secondary | ICD-10-CM | POA: Insufficient documentation

## 2021-12-11 DIAGNOSIS — Z8669 Personal history of other diseases of the nervous system and sense organs: Secondary | ICD-10-CM | POA: Diagnosis not present

## 2021-12-11 NOTE — Assessment & Plan Note (Signed)
Need to rule out CVA or other brain pathology.

## 2021-12-11 NOTE — Assessment & Plan Note (Signed)
I suspect that this is due to post nasal drip. Will send of strep culture to be sure.

## 2021-12-11 NOTE — Assessment & Plan Note (Signed)
Need to rule out CVA, if negative for CVA, then most likely related to BPPV.

## 2021-12-12 ENCOUNTER — Encounter: Payer: Self-pay | Admitting: Hematology & Oncology

## 2021-12-12 ENCOUNTER — Inpatient Hospital Stay: Payer: Medicare Other | Attending: Hematology & Oncology

## 2021-12-12 ENCOUNTER — Other Ambulatory Visit: Payer: Self-pay

## 2021-12-12 ENCOUNTER — Inpatient Hospital Stay (HOSPITAL_BASED_OUTPATIENT_CLINIC_OR_DEPARTMENT_OTHER): Payer: Medicare Other | Admitting: Hematology & Oncology

## 2021-12-12 VITALS — BP 146/87 | HR 76 | Temp 98.0°F | Resp 18 | Wt 192.1 lb

## 2021-12-12 DIAGNOSIS — R197 Diarrhea, unspecified: Secondary | ICD-10-CM | POA: Insufficient documentation

## 2021-12-12 DIAGNOSIS — C9000 Multiple myeloma not having achieved remission: Secondary | ICD-10-CM

## 2021-12-12 DIAGNOSIS — Z950 Presence of cardiac pacemaker: Secondary | ICD-10-CM | POA: Insufficient documentation

## 2021-12-12 LAB — CBC WITH DIFFERENTIAL (CANCER CENTER ONLY)
Abs Immature Granulocytes: 0.02 10*3/uL (ref 0.00–0.07)
Basophils Absolute: 0 10*3/uL (ref 0.0–0.1)
Basophils Relative: 0 %
Eosinophils Absolute: 0 10*3/uL (ref 0.0–0.5)
Eosinophils Relative: 0 %
HCT: 38 % (ref 36.0–46.0)
Hemoglobin: 12.3 g/dL (ref 12.0–15.0)
Immature Granulocytes: 0 %
Lymphocytes Relative: 9 %
Lymphs Abs: 0.4 10*3/uL — ABNORMAL LOW (ref 0.7–4.0)
MCH: 29.1 pg (ref 26.0–34.0)
MCHC: 32.4 g/dL (ref 30.0–36.0)
MCV: 90 fL (ref 80.0–100.0)
Monocytes Absolute: 0.2 10*3/uL (ref 0.1–1.0)
Monocytes Relative: 3 %
Neutro Abs: 4.2 10*3/uL (ref 1.7–7.7)
Neutrophils Relative %: 88 %
Platelet Count: 108 10*3/uL — ABNORMAL LOW (ref 150–400)
RBC: 4.22 MIL/uL (ref 3.87–5.11)
RDW: 14 % (ref 11.5–15.5)
WBC Count: 4.8 10*3/uL (ref 4.0–10.5)
nRBC: 0 % (ref 0.0–0.2)

## 2021-12-12 LAB — CULTURE, GROUP A STREP
MICRO NUMBER:: 12949026
SPECIMEN QUALITY:: ADEQUATE

## 2021-12-12 LAB — CMP (CANCER CENTER ONLY)
ALT: 18 U/L (ref 0–44)
AST: 17 U/L (ref 15–41)
Albumin: 3.6 g/dL (ref 3.5–5.0)
Alkaline Phosphatase: 49 U/L (ref 38–126)
Anion gap: 6 (ref 5–15)
BUN: 14 mg/dL (ref 8–23)
CO2: 26 mmol/L (ref 22–32)
Calcium: 9.8 mg/dL (ref 8.9–10.3)
Chloride: 105 mmol/L (ref 98–111)
Creatinine: 0.59 mg/dL (ref 0.44–1.00)
GFR, Estimated: >60 mL/min (ref >60)
Glucose, Bld: 142 mg/dL — ABNORMAL HIGH (ref 70–99)
Potassium: 4.2 mmol/L (ref 3.5–5.1)
Sodium: 137 mmol/L (ref 135–145)
Total Bilirubin: 0.5 mg/dL (ref 0.3–1.2)
Total Protein: 7.7 g/dL (ref 6.5–8.1)

## 2021-12-12 LAB — LACTATE DEHYDROGENASE: LDH: 189 U/L (ref 98–192)

## 2021-12-12 NOTE — Progress Notes (Signed)
Hematology and Oncology Follow Up Visit  Tami [name] [id number] 1956/06/25 66 y.o. 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  She actually looks pretty good.  She has been at [facility].  She did have a biopsy in the right temple area.  Thankfully, there is no giant cell arteritis.  I do not think she has had a colonoscopy as of yet.  She may have had 1.  We are seeing if sure there is any evidence of any amyloidosis that might be causing the diarrhea.  Her diarrhea is doing a lot better.  She is on cholestyramine powder and Lomotil.  When we last saw her, her monoclonal spike was 1.4 g/dL.  The IgG level was 3178 mg/dL.  The Kappa light chain was 6.1 mg/dL.  We are giving her a 24-hour urine jug to do a 24-hour urine collection.  She has had no issues with nausea or vomiting right now..  She does have cardiac issues.  She does have a pacemaker in.  She sees cardiology next week.  Currently, her performance status is ECOG 1.    Medications:  Allergies as of 12/12/2021       Reactions   Benazepril Anaphylaxis, Swelling, Hives   angioedema Throat and lip swelling   Ondansetron Hcl Hives   Redness and hives post IV admin on 07/05/17   Codeine Nausea And Vomiting   Morphine Hives   Redness and hives noted post IV admin on 07/05/17        Medication List        Accurate as of December 12, 2021  9:07 AM. If you have any questions, ask your nurse or doctor.          acetaminophen 500 MG tablet Commonly known as: TYLENOL Take 500 mg by mouth every 6 (six) hours as  needed.   acyclovir 400 MG tablet Commonly known as: ZOVIRAX TAKE ONE (1) TABLET BY MOUTH TWO (2) TIMES DAILY   albuterol 108 (90 Base) MCG/ACT inhaler Commonly known as: VENTOLIN HFA Inhale 2 puffs into the lungs every 6 (six) hours as needed for wheezing or shortness of breath.   aspirin EC 81 MG tablet Take 1 tablet (81 mg total) by mouth daily.   budesonide-formoterol 160-4.5 MCG/ACT inhaler Commonly known as: SYMBICORT Inhale 2 puffs into the lungs as needed.   carvedilol 25 MG tablet Commonly known as: COREG Take 1 tablet (25 mg total) by mouth 2 (two) times daily with a meal.   celecoxib 100 MG capsule Commonly known as: CeleBREX Take 1 capsule (100 mg total) by mouth 2 (two) times daily as needed for mild pain or moderate pain (with food).   cholestyramine 4 g packet Commonly known as: Questran Take 1 packet (4 g total) by mouth 2 (two) times daily.   cyclobenzaprine 10 MG tablet Commonly known as: FLEXERIL Take 1 tablet (10 mg total) by mouth at bedtime. What changed:  when to take this reasons to take this   dicyclomine 10 MG capsule Commonly known as: BENTYL Take  1 tab every 6 hours as needed for abdominal cramping.   diphenhydrAMINE 25 mg capsule Commonly known as: BENADRYL TAKE ONE (1) CAPSULE BY MOUTH THREE (3) TIMES EACH DAY AS NEEDED   diphenoxylate-atropine 2.5-0.025 MG tablet Commonly known as: Lomotil Take 1 tablet by mouth 4 (four) times daily as needed for diarrhea or loose stools.   erythromycin ophthalmic ointment 1 application QID.   Ferrous Fumarate-Folic Acid 353-6 MG Tabs Take 324 mg by mouth daily.   fluticasone 50 MCG/ACT nasal spray Commonly known as: FLONASE Place 2 sprays into both nostrils daily. What changed:  when to take this reasons to take this   fluticasone furoate-vilanterol 100-25 MCG/ACT Aepb Commonly known as: BREO ELLIPTA Inhale 1 puff into the lungs daily. What changed:  when to take this reasons to take  this   furosemide 20 MG tablet Commonly known as: LASIX Take 1 tablet by mouth as needed.   HYDROcodone-acetaminophen 5-325 MG tablet Commonly known as: NORCO/VICODIN Take 1 tablet by mouth every 6 (six) hours as needed.   hydrOXYzine 25 MG tablet Commonly known as: ATARAX Take 25 mg by mouth as needed.   ibuprofen 800 MG tablet Commonly known as: ADVIL Take 1 tablet (800 mg total) by mouth 3 (three) times daily. What changed:  when to take this reasons to take this   loperamide 2 MG capsule Commonly known as: IMODIUM Take 2 mg by mouth as needed.   loratadine 10 MG tablet Commonly known as: CLARITIN Take 1 tablet (10 mg total) by mouth daily.   losartan 50 MG tablet Commonly known as: COZAAR Take 50 mg by mouth daily.   magnesium oxide 400 MG tablet Commonly known as: MAG-OX Take 1 tablet (400 mg total) by mouth 2 (two) times daily.   meclizine 12.5 MG tablet Commonly known as: ANTIVERT Take 12.5 mg by mouth as needed.   metoCLOPramide 10 MG tablet Commonly known as: REGLAN Take 10 mg by mouth as needed.   montelukast 10 MG tablet Commonly known as: Singulair Take 1 tablet (10 mg total) by mouth at bedtime. What changed:  when to take this reasons to take this   MULTIVITAMIN ADULT PO Take 1 tablet by mouth daily.   omeprazole 40 MG capsule Commonly known as: PRILOSEC Take 1 capsule (40 mg total) by mouth in the morning and at bedtime.   predniSONE 20 MG tablet Commonly known as: DELTASONE Take 60 mg by mouth daily.   promethazine 25 MG tablet Commonly known as: PHENERGAN Take 1 tablet (25 mg total) by mouth every 8 (eight) hours as needed for nausea or vomiting (headache).   sucralfate 1 g tablet Commonly known as: Carafate Take 1 tablet (1 g total) by mouth 3 (three) times daily as needed. prn   tiZANidine 4 MG tablet Commonly known as: Zanaflex Take 1 tablet (4 mg total) by mouth every 6 (six) hours as needed for muscle spasms.    topiramate 50 MG tablet Commonly known as: Topamax Take 1 tablet (50 mg total) by mouth 2 (two) times daily.   triamcinolone cream 0.1 % Commonly known as: KENALOG Apply 1 application topically as needed.   Vitamin D-3 25 MCG (1000 UT) Caps Take 1 capsule (1,000 Units total) by mouth daily.   zinc sulfate 220 (50 Zn) MG capsule Take 220 mg by mouth 2 (two) times daily.        Allergies:  Allergies  Allergen Reactions   Benazepril Anaphylaxis, Swelling and Hives    angioedema  Throat and lip swelling   Ondansetron Hcl Hives    Redness and hives post IV admin on 07/05/17   Codeine Nausea And Vomiting   Morphine Hives    Redness and hives noted post IV admin on 07/05/17    Past Medical History, Surgical history, Social history, and Family History were reviewed and updated.  Review of Systems: Review of Systems  Constitutional: Negative.   HENT: Negative.    Eyes: Negative.   Respiratory: Negative.    Cardiovascular: Negative.   Gastrointestinal: Negative.   Genitourinary: Negative.   Musculoskeletal: Negative.   Skin: Negative.   Neurological: Negative.   Endo/Heme/Allergies: Negative.   Psychiatric/Behavioral: Negative.       Physical Exam:  weight is 192 lb 1.3 oz (87.1 kg). Her oral temperature is 98 F (36.7 C). Her blood pressure is 146/87 (abnormal) and her pulse is 76. Her respiration is 18 and oxygen saturation is 98%.   Wt Readings from Last 3 Encounters:  12/12/21 192 lb 1.3 oz (87.1 kg)  12/10/21 193 lb 6.4 oz (87.7 kg)  12/05/21 193 lb 2 oz (87.6 kg)    Physical Exam Vitals reviewed.  HENT:     Head: Normocephalic and atraumatic.  Eyes:     Pupils: Pupils are equal, round, and reactive to light.  Cardiovascular:     Rate and Rhythm: Normal rate and regular rhythm.     Heart sounds: Normal heart sounds.  Pulmonary:     Effort: Pulmonary effort is normal.     Breath sounds: Normal breath sounds.  Abdominal:     General: Bowel sounds are  normal.     Palpations: Abdomen is soft.  Musculoskeletal:        General: No tenderness or deformity. Normal range of motion.     Cervical back: Normal range of motion.  Lymphadenopathy:     Cervical: No cervical adenopathy.  Skin:    General: Skin is warm and dry.     Findings: No erythema or rash.  Neurological:     Mental Status: She is alert and oriented to person, place, and time.  Psychiatric:        Behavior: Behavior normal.        Thought Content: Thought content normal.        Judgment: Judgment normal.     Lab Results  Component Value Date   WBC 4.8 12/12/2021   HGB 12.3 12/12/2021   HCT 38.0 12/12/2021   MCV 90.0 12/12/2021   PLT 108 (L) 12/12/2021   Lab Results  Component Value Date   FERRITIN 578 (H) 12/05/2021   IRON 128 12/05/2021   TIBC 351 12/05/2021   UIBC 160 08/26/2020   IRONPCTSAT 36 12/05/2021   Lab Results  Component Value Date   RETICCTPCT 0.8 07/16/2016   RBC 4.22 12/12/2021   RETICCTABS 32,640 07/16/2016   Lab Results  Component Value Date   KPAFRELGTCHN 61.1 (H) 11/11/2021   LAMBDASER 4.0 (L) 11/11/2021   KAPLAMBRATIO 15.28 (H) 11/11/2021   Lab Results  Component Value Date   IGGSERUM 501 (L) 11/11/2021   IGA 57 (L) 11/11/2021   IGMSERUM 8 (L) 11/11/2021   Lab Results  Component Value Date   TOTALPROTELP 8.0 11/11/2021   ALBUMINELP 3.3 11/11/2021   A1GS 0.3 11/11/2021   A2GS 1.1 (H) 11/11/2021   BETS 1.1 11/11/2021   BETA2SER 0.5 07/16/2016   GAMS 2.3 (H) 11/11/2021   MSPIKE 1.4 (H) 11/11/2021   SPEI Comment 12/19/2020  Chemistry      Component Value Date/Time   NA 137 12/12/2021 0809   NA 143 11/08/2017 1127   NA 140 12/31/2016 1000   K 4.2 12/12/2021 0809   K 4.0 11/08/2017 1127   K 3.6 12/31/2016 1000   CL 105 12/12/2021 0809   CL 107 11/08/2017 1127   CO2 26 12/12/2021 0809   CO2 25 11/08/2017 1127   CO2 24 12/31/2016 1000   BUN 14 12/12/2021 0809   BUN 19 11/08/2017 1127   BUN 22.3 12/31/2016 1000    CREATININE 0.59 12/12/2021 0809   CREATININE 0.9 11/08/2017 1127   CREATININE 0.8 12/31/2016 1000      Component Value Date/Time   CALCIUM 9.8 12/12/2021 0809   CALCIUM 9.9 11/08/2017 1127   CALCIUM 9.5 12/31/2016 1000   ALKPHOS 49 12/12/2021 0809   ALKPHOS 44 11/08/2017 1127   ALKPHOS 51 12/31/2016 1000   AST 17 12/12/2021 0809   AST 28 12/31/2016 1000   ALT 18 12/12/2021 0809   ALT 36 11/08/2017 1127   ALT 16 12/31/2016 1000   BILITOT 0.5 12/12/2021 0809   BILITOT 0.46 12/31/2016 1000       Impression and Plan: Tami [name] is a very pleasant 66 yo African American female with a previous IgG kappa MGUS with progression to myeloma.   So far, she been off treatment now for about 15 months.  As such, I cannot imagine anything treatment related could be causing this diarrhea.  Again, amyloidosis would be the only thing I could think of that might be causing this diarrhea.  It would be nice if we saw something elsewhere on exam.  We will see if the 24-hour urine has to show [place].  I am just happy that her quality of life is doing a little bit better right now.  We will go ahead and plan to get her back to see [place] in another 6 weeks.  I think this would be reasonable.   [name], MD 2/3/20239:07 AM

## 2021-12-14 LAB — IGG, IGA, IGM
IgA: 111 mg/dL (ref 87–352)
IgG (Immunoglobin G), Serum: 2564 mg/dL — ABNORMAL HIGH (ref 586–1602)
IgM (Immunoglobulin M), Srm: 107 mg/dL (ref 26–217)

## 2021-12-15 ENCOUNTER — Ambulatory Visit (HOSPITAL_COMMUNITY)
Admission: RE | Admit: 2021-12-15 | Discharge: 2021-12-15 | Disposition: A | Discharge status: Home / Self Care | Payer: Medicare Other | Source: Ambulatory Visit | Attending: Gastroenterology | Admitting: Gastroenterology

## 2021-12-15 ENCOUNTER — Other Ambulatory Visit: Payer: Self-pay

## 2021-12-15 DIAGNOSIS — K219 Gastro-esophageal reflux disease without esophagitis: Secondary | ICD-10-CM | POA: Diagnosis not present

## 2021-12-15 DIAGNOSIS — R112 Nausea with vomiting, unspecified: Secondary | ICD-10-CM | POA: Diagnosis not present

## 2021-12-15 DIAGNOSIS — K760 Fatty (change of) liver, not elsewhere classified: Secondary | ICD-10-CM | POA: Insufficient documentation

## 2021-12-15 DIAGNOSIS — R1310 Dysphagia, unspecified: Secondary | ICD-10-CM | POA: Insufficient documentation

## 2021-12-15 DIAGNOSIS — R197 Diarrhea, unspecified: Secondary | ICD-10-CM | POA: Insufficient documentation

## 2021-12-15 LAB — KAPPA/LAMBDA LIGHT CHAINS
Kappa free light chain: 25.9 mg/L — ABNORMAL HIGH (ref 3.3–19.4)
Kappa, lambda light chain ratio: 8.35 — ABNORMAL HIGH (ref 0.26–1.65)
Lambda free light chains: 3.1 mg/L — ABNORMAL LOW (ref 5.7–26.3)

## 2021-12-15 MED ORDER — TECHNETIUM TC 99M SULFUR COLLOID
2.2000 | Freq: Once | INTRAVENOUS | Status: AC
  Administered 2021-12-15: 2.2 via INTRAVENOUS

## 2021-12-16 DIAGNOSIS — I509 Heart failure, unspecified: Secondary | ICD-10-CM | POA: Diagnosis not present

## 2021-12-16 DIAGNOSIS — E785 Hyperlipidemia, unspecified: Secondary | ICD-10-CM | POA: Diagnosis not present

## 2021-12-16 DIAGNOSIS — Z23 Encounter for immunization: Secondary | ICD-10-CM | POA: Diagnosis not present

## 2021-12-16 DIAGNOSIS — Z79899 Other long term (current) drug therapy: Secondary | ICD-10-CM | POA: Diagnosis not present

## 2021-12-16 DIAGNOSIS — R5383 Other fatigue: Secondary | ICD-10-CM | POA: Diagnosis not present

## 2021-12-16 DIAGNOSIS — Z1211 Encounter for screening for malignant neoplasm of colon: Secondary | ICD-10-CM | POA: Diagnosis not present

## 2021-12-16 DIAGNOSIS — Z0000 Encounter for general adult medical examination without abnormal findings: Secondary | ICD-10-CM | POA: Diagnosis not present

## 2021-12-16 DIAGNOSIS — G8929 Other chronic pain: Secondary | ICD-10-CM | POA: Diagnosis not present

## 2021-12-16 DIAGNOSIS — M129 Arthropathy, unspecified: Secondary | ICD-10-CM | POA: Diagnosis not present

## 2021-12-16 DIAGNOSIS — R109 Unspecified abdominal pain: Secondary | ICD-10-CM | POA: Diagnosis not present

## 2021-12-16 DIAGNOSIS — E559 Vitamin D deficiency, unspecified: Secondary | ICD-10-CM | POA: Diagnosis not present

## 2021-12-16 DIAGNOSIS — E611 Iron deficiency: Secondary | ICD-10-CM | POA: Diagnosis not present

## 2021-12-16 DIAGNOSIS — R030 Elevated blood-pressure reading, without diagnosis of hypertension: Secondary | ICD-10-CM | POA: Diagnosis not present

## 2021-12-16 DIAGNOSIS — R7309 Other abnormal glucose: Secondary | ICD-10-CM | POA: Diagnosis not present

## 2021-12-16 DIAGNOSIS — R0602 Shortness of breath: Secondary | ICD-10-CM | POA: Diagnosis not present

## 2021-12-16 DIAGNOSIS — A048 Other specified bacterial intestinal infections: Secondary | ICD-10-CM | POA: Diagnosis not present

## 2021-12-16 DIAGNOSIS — H9201 Otalgia, right ear: Secondary | ICD-10-CM | POA: Diagnosis not present

## 2021-12-16 LAB — PANCREATIC ELASTASE, FECAL: Pancreatic Elastase-1, Stool: >500 ug/g

## 2021-12-17 ENCOUNTER — Other Ambulatory Visit: Payer: Self-pay

## 2021-12-17 ENCOUNTER — Telehealth: Payer: Self-pay

## 2021-12-17 DIAGNOSIS — R197 Diarrhea, unspecified: Secondary | ICD-10-CM | POA: Diagnosis not present

## 2021-12-17 DIAGNOSIS — Z79899 Other long term (current) drug therapy: Secondary | ICD-10-CM | POA: Diagnosis not present

## 2021-12-17 DIAGNOSIS — K219 Gastro-esophageal reflux disease without esophagitis: Secondary | ICD-10-CM | POA: Diagnosis not present

## 2021-12-17 DIAGNOSIS — I428 Other cardiomyopathies: Secondary | ICD-10-CM | POA: Diagnosis not present

## 2021-12-17 DIAGNOSIS — R0681 Apnea, not elsewhere classified: Secondary | ICD-10-CM | POA: Diagnosis not present

## 2021-12-17 DIAGNOSIS — R5383 Other fatigue: Secondary | ICD-10-CM | POA: Diagnosis not present

## 2021-12-17 DIAGNOSIS — C9000 Multiple myeloma not having achieved remission: Secondary | ICD-10-CM | POA: Diagnosis not present

## 2021-12-17 DIAGNOSIS — Z950 Presence of cardiac pacemaker: Secondary | ICD-10-CM | POA: Diagnosis not present

## 2021-12-17 DIAGNOSIS — I447 Left bundle-branch block, unspecified: Secondary | ICD-10-CM | POA: Diagnosis not present

## 2021-12-17 DIAGNOSIS — Z95810 Presence of automatic (implantable) cardiac defibrillator: Secondary | ICD-10-CM | POA: Diagnosis not present

## 2021-12-17 DIAGNOSIS — R531 Weakness: Secondary | ICD-10-CM | POA: Diagnosis not present

## 2021-12-17 DIAGNOSIS — I255 Ischemic cardiomyopathy: Secondary | ICD-10-CM | POA: Diagnosis not present

## 2021-12-17 LAB — PROTEIN ELECTROPHORESIS, SERUM, WITH REFLEX
A/G Ratio: 0.7 (ref 0.7–1.7)
Albumin ELP: 3 g/dL (ref 2.9–4.4)
Alpha-1-Globulin: 0.2 g/dL (ref 0.0–0.4)
Alpha-2-Globulin: 1 g/dL (ref 0.4–1.0)
Beta Globulin: 1.1 g/dL (ref 0.7–1.3)
Gamma Globulin: 2.1 g/dL — ABNORMAL HIGH (ref 0.4–1.8)
Globulin, Total: 4.4 g/dL — ABNORMAL HIGH (ref 2.2–3.9)
M-Spike, %: 1.6 g/dL — ABNORMAL HIGH
SPEP Interpretation: 0
Total Protein ELP: 7.4 g/dL (ref 6.0–8.5)

## 2021-12-17 LAB — IMMUNOFIXATION REFLEX, SERUM

## 2021-12-17 NOTE — Telephone Encounter (Signed)
LMOM requesting a CB for results.

## 2021-12-17 NOTE — Telephone Encounter (Signed)
-----   Message from [name], MD sent at 12/17/2021 11:58 AM EST ----- Call and let him know that the myeloma is holding pretty steady.  Laurey Arrow

## 2021-12-18 ENCOUNTER — Telehealth: Payer: Self-pay | Admitting: *Deleted

## 2021-12-18 NOTE — Telephone Encounter (Signed)
As noted below by Dr. Marin Olp, I informed the patient that the Myeloma is holding steady. She verbalized understanding.

## 2021-12-18 NOTE — Telephone Encounter (Signed)
-----   Message from [name], MD sent at 12/17/2021 11:58 AM EST ----- Call and let him know that the myeloma is holding pretty steady.  Laurey Arrow

## 2021-12-18 NOTE — Telephone Encounter (Signed)
Spoke with and everything was cleared up

## 2021-12-18 NOTE — Telephone Encounter (Signed)
Pt called to return Shaakira's message. Please advise.

## 2021-12-19 LAB — UPEP/UIFE/LIGHT CHAINS/TP, 24-HR UR
% BETA, Urine: 28.2 %
ALPHA 1 URINE: 7.9 %
Albumin, U: 23.4 %
Alpha 2, Urine: 18.5 %
Free Kappa Lt Chains,Ur: 30.44 mg/L (ref 1.17–86.46)
Free Kappa/Lambda Ratio: 8.39 (ref 1.83–14.26)
Free Lambda Lt Chains,Ur: 3.63 mg/L (ref 0.27–15.21)
GAMMA GLOBULIN URINE: 22.1 %
Total Protein, Urine-Ur/day: 139 mg/24 hr (ref 30–150)
Total Protein, Urine: 9.6 mg/dL
Total Volume: 1450

## 2021-12-22 ENCOUNTER — Telehealth: Payer: Self-pay

## 2021-12-22 NOTE — Telephone Encounter (Signed)
Called patient to inform her of lab results. Pt informed that her urine had no abnormal protein. Pt verbalized understanding. Pt aware of next appointment date and time. Pt aware to call clinic with any questions or concerns. Pt appreciative of call and had no further questions.

## 2021-12-22 NOTE — Telephone Encounter (Signed)
-----   Message from [name], MD sent at 12/19/2021  5:15 PM EST ----- Call - the urine looks clean!!!  No abnormal protein!!  Laurey Arrow

## 2021-12-23 DIAGNOSIS — G4733 Obstructive sleep apnea (adult) (pediatric): Secondary | ICD-10-CM | POA: Diagnosis not present

## 2021-12-24 DIAGNOSIS — H35713 Central serous chorioretinopathy, bilateral: Secondary | ICD-10-CM | POA: Diagnosis not present

## 2021-12-24 DIAGNOSIS — H04123 Dry eye syndrome of bilateral lacrimal glands: Secondary | ICD-10-CM | POA: Diagnosis not present

## 2021-12-24 DIAGNOSIS — G44219 Episodic tension-type headache, not intractable: Secondary | ICD-10-CM | POA: Diagnosis not present

## 2021-12-30 ENCOUNTER — Other Ambulatory Visit: Payer: Self-pay | Admitting: Hematology & Oncology

## 2022-01-02 ENCOUNTER — Encounter: Payer: Self-pay | Admitting: Family Medicine

## 2022-01-02 ENCOUNTER — Ambulatory Visit (INDEPENDENT_AMBULATORY_CARE_PROVIDER_SITE_OTHER): Payer: Medicare Other | Admitting: Family Medicine

## 2022-01-02 VITALS — BP 140/80 | HR 89 | Temp 97.8°F | Resp 16 | Ht 65.0 in | Wt 196.0 lb

## 2022-01-02 DIAGNOSIS — L299 Pruritus, unspecified: Secondary | ICD-10-CM

## 2022-01-02 MED ORDER — TRIAMCINOLONE ACETONIDE 0.1 % EX CREA: 1.0000 "application " | TOPICAL_CREAM | Freq: Two times a day (BID) | CUTANEOUS | 0 refills | Status: DC

## 2022-01-02 MED ORDER — METHYLPREDNISOLONE ACETATE 80 MG/ML IJ SUSP
80.0000 mg | Freq: Once | INTRAMUSCULAR | Status: AC
  Administered 2022-01-02: 80 mg via INTRAMUSCULAR

## 2022-01-02 NOTE — Progress Notes (Signed)
Chief Complaint  Patient presents with   Allergic Reaction    Here for Allergic Reaction on neck     Tami [name] is a 66 y.o. female here for a skin complaint.  Duration: 1 week Location: neck and upper shoulder Pruritic? Yes Painful? No Drainage? No New soaps/lotions/topicals/detergents? No Sick contacts? No Other associated symptoms: no swelling, redness, SOB Therapies tried thus far: Cocoa butter  Past Medical History:  Diagnosis Date   Abnormal SPEP 07/26/2016   Arthritis    knees, hands   Back pain 07/14/2016   Bowel obstruction ([place]) 11/2014   C. 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   Low back pain 07/14/2016   Monoclonal gammopathy of unknown significance (MGUS) 09/16/2016   Multiple myeloma ([place]) 07/20/2018   Multiple myeloma not having achieved remission ([place]) 07/20/2018   Myalgia 12/31/2016   Obstructive sleep apnea 03/31/2015   SOB (shortness of breath) 04/09/2016   Stroke (HCC)    TIAs   UTI (urinary tract infection)     BP 140/80 (BP Location: Right Arm, Patient Position: Sitting, Cuff Size: Normal)    Pulse 89    Temp 97.8 F (36.6 C) (Oral)    Resp 16    Ht $R'5\' 5"'mw$  (1.651 m)    Wt 196 lb (88.9 kg)    SpO2 98%    BMI 32.62 kg/m  Gen: awake, alert, appearing stated age Lungs: CTAB, no stridor. No accessory muscle use Mouth: MMM, no edema or asymmetry Skin: some scaling and excoriation around neck and shoulder girdle. No drainage, erythema, TTP, fluctuance Psych: Age appropriate judgment and insight  Pruritus - Plan: triamcinolone cream (KENALOG) 0.1 %  Orders as above. Try not to scratch. Cream tomorrow as Depo injection today. Avoid scented products. Cool  compresses.  F/u prn. The patient voiced understanding and agreement to the plan.  [place], DO 01/02/22 4:10 PM

## 2022-01-02 NOTE — Patient Instructions (Signed)
Continue Zyrtec.  Consider a non-scented emollient like Aquaphor or Vaseline.  Try not to scratch as this can make things worse. Avoid scented products while dealing with this. You may resume when the itchiness resolves. Cold/cool compresses can help.   Let us know if you need anything.

## 2022-01-02 NOTE — Addendum Note (Signed)
Addended by: Sharon Seller B on: 01/02/2022 04:18 PM   Modules accepted: Orders

## 2022-01-07 ENCOUNTER — Ambulatory Visit: Payer: Self-pay

## 2022-01-07 ENCOUNTER — Telehealth: Payer: Self-pay

## 2022-01-07 ENCOUNTER — Ambulatory Visit (INDEPENDENT_AMBULATORY_CARE_PROVIDER_SITE_OTHER): Payer: Medicare Other | Admitting: Orthopaedic Surgery

## 2022-01-07 ENCOUNTER — Encounter: Payer: Self-pay | Admitting: Orthopaedic Surgery

## 2022-01-07 ENCOUNTER — Other Ambulatory Visit: Payer: Self-pay

## 2022-01-07 VITALS — Ht 65.0 in | Wt 198.0 lb

## 2022-01-07 DIAGNOSIS — G8929 Other chronic pain: Secondary | ICD-10-CM

## 2022-01-07 DIAGNOSIS — M25561 Pain in right knee: Secondary | ICD-10-CM

## 2022-01-07 MED ORDER — METHYLPREDNISOLONE ACETATE 40 MG/ML IJ SUSP
80.0000 mg | INTRAMUSCULAR | Status: AC | PRN
  Administered 2022-01-07: 80 mg via INTRA_ARTICULAR

## 2022-01-07 MED ORDER — LIDOCAINE HCL 1 % IJ SOLN
5.0000 mL | INTRAMUSCULAR | Status: AC | PRN
  Administered 2022-01-07: 5 mL

## 2022-01-07 NOTE — Telephone Encounter (Signed)
Please precert for right knee visco. [facility] patient. ?Thanks ? ?

## 2022-01-07 NOTE — Progress Notes (Addendum)
? ?Office Visit Note ?  ?Patient: Tami [name]           ?Date of Birth: 12-20-1955           ?MRN: [id number] ?Visit Date: 01/07/2022 ?             ?Requested by: Mosie Lukes, MD ?[name] ?[place],  [place] [address] ?PCP: Mosie Lukes, MD ? ? ?Assessment & Plan: ?Visit Diagnoses: Right pain ? ?Plan: 66 year old woman with increasing right knee pain.  She is status post right knee arthroscopy 25 years ago.  She did well for a while and then in the last couple years had increasing knee pain.  She did have 1 aspiration and injection in January which helped her but only lasted for about a month.  X-rays demonstrate advanced tricompartmental arthritis with bone-on-bone in the lateral compartment and valgus malalignment.  She has significant osteophyte formation as well.  She is in remission from multiple myeloma and is followed by blood work.  Did not see any osseous concerns with regards to this and her x-ray today.  We did attempt aspiration today but she did not have any significant fluid gave her an cortisone.  She knows that she should not have an injection of cortisone soon.  We will place an order for viscosupplementation to see if this might help her.  She also is going to use an over-the-counter knee support.  We will contact her when the viscosupplementation has been approved ? ?Follow-Up Instructions: No follow-ups on file.  ? ?Orders:  ?No orders of the defined types were placed in this encounter. ? ?No orders of the defined types were placed in this encounter. ? ? ? ? Procedures: ?Large Joint Inj: R knee on 01/07/2022 4:22 PM ?Indications: pain and diagnostic evaluation ?Details: 25 G 1.5 in needle, anteromedial approach ? ?Arthrogram: No ? ?Medications: 80 mg methylPREDNISolone acetate 40 MG/ML; 5 mL lidocaine 1 % ?Outcome: tolerated well, no immediate complications ?Procedure, treatment alternatives, risks and benefits explained, specific risks discussed. Consent was given by the  patient.  ? ? ? ? ?Clinical Data: ?No additional findings. ? ? ?Subjective: ?Chief Complaint  ?Patient presents with  ?? Right Knee - Pain  ?Patient presents today for right knee pain. She said that it has been hurting for three months. No known injury. Her pain is located anteriorly. She said that it hurts all the time, but worse with walking.  ? ? ? ?Review of Systems  ?All other systems reviewed and are negative. ? ? ?Objective: ?Vital Signs: There were no vitals taken for this visit. ? ?Physical Exam ?Vitals reviewed.  ?Constitutional:   ?   Appearance: Normal appearance.  ?Neurological:  ?   General: No focal deficit present.  ?   Mental Status: She is alert and oriented to person, place, and time.  ?Psychiatric:     ?   Mood and Affect: Mood normal.     ?   Behavior: Behavior normal.  ? ? ?Ortho Exam ?Examination of the right knee she has valgus malalignment.  She has a small effusion but no cellulitis.  She is tender to palpation over the patellofemoral joint as well as the lateral joint line.  She has positive patellar grinding with range of motion.  Lower compartments of the leg are soft and nontender distal CMS is intact ?Specialty Comments:  ?No specialty comments available. ? ?Imaging: ?No results found. ? ? ?PMFS History: ?Patient Active  Problem List  ? Diagnosis Date Noted  ?? Dizziness 12/11/2021  ?? Visual changes 11/26/2021  ?? Dehydration 09/16/2021  ?? Ear fullness, bilateral 08/12/2021  ?? Bilateral knee pain 08/08/2021  ?? History of 2019 novel coronavirus disease (COVID-19) 12/06/2020  ?? Low vitamin D level 07/31/2020  ?? Acute combined systolic and diastolic heart failure ([place]) 05/09/2020  ?? Low magnesium level 03/04/2020  ?? Thrombocytopenia ([facility]) 03/04/2020  ?? Dysphagia 03/04/2020  ?? Constipation 03/04/2020  ?? Sensation of pressure in bladder area 01/17/2020  ?? Hyperglycemia 01/17/2020  ?? Peripheral neuropathy 11/15/2019  ?? Chronic migraine without aura, with intractable migraine, so  stated, with status migrainosus 10/17/2019  ?? ETD (Eustachian tube dysfunction), bilateral 09/12/2019  ?? Urinary frequency 07/17/2019  ?? Headache 07/17/2019  ?? Palpitation 05/15/2019  ?? Diarrhea 09/04/2018  ?? Thyroid nodule 08/26/2018  ?? Multiple myeloma ([place]) 07/20/2018  ?? Multiple myeloma not having achieved remission ([place]) 07/20/2018  ?? Hematuria 07/11/2018  ?? Seasonal allergies 06/23/2018  ?? Right shoulder pain 06/07/2018  ?? Neck pain 06/07/2018  ?? Paresthesias 06/07/2018  ?? 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 Pain in thoracic spine 03/18/2018  ?? Thoracic back pain 02/15/2018  ?? 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 Cystitis 10/25/2017  ?? Hypokalemia 08/20/2017  ?? Anemia 08/20/2017  ?? Elevated sed rate 08/15/2017  ?? 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 Epigastric pain 03/22/2017  ?? Myalgia 12/31/2016  ?? SOB (shortness of breath) 07/26/2016  ?? Abnormal SPEP 07/26/2016  ?? Low back pain 07/14/2016  ?? Sore throat 04/19/2016  ?? Insomnia 12/29/2015  ?? Tension headache 10/21/2015  ?? Muscle spasms of neck 10/18/2015  ?? Dysuria 10/13/2015  ?? Sinusitis 07/07/2015  ?? AP (abdominal pain) 07/07/2015  ?? Cardiomyopathy ([place]) 04/19/2015  ?? Chest wall pain 04/19/2015  ?? Obstructive sleep apnea 03/31/2015  ?? Congestive heart failure ([place]) 02/24/2015  ?? Arthritis   ?? Hyperlipidemia   ?? Hypertension   ?? Depression   ?? Stroke [facility])   ?? GERD (gastroesophageal reflux disease)   ?? Bowel obstruction ([place]) 11/09/2014  ? ?Past Medical History:  ?Diagnosis Date  ?? Abnormal SPEP 07/26/2016  ?? Arthritis   ? knees, hands  ?? Back pain 07/14/2016  ?? Bowel obstruction ([place]) 11/2014  ?? C. difficile diarrhea   ?? CHF (congestive heart failure) ([place])   ?? Colitis   ?? Congestive heart failure ([place]) 02/24/2015  ? S/p pacemaker  ?? Depression   ?? Elevated sed rate 08/15/2017  ?? Epigastric pain 03/22/2017  ?? GERD (gastroesophageal reflux disease)   ?? Heart disease    ?? Hyperlipidemia   ?? Hypertension   ?? 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 Low back pain 07/14/2016  ?? Monoclonal gammopathy of unknown significance (MGUS) 09/16/2016  ?? Multiple myeloma ([place]) 07/20/2018  ?? Multiple myeloma not having achieved remission ([place]) 07/20/2018  ?? Myalgia 12/31/2016  ?? Obstructive sleep apnea 03/31/2015  ?? SOB (shortness of breath) 04/09/2016  ?? Stroke [facility])   ? TIAs  ?? UTI (urinary tract infection)   ?  ?Family History  ?Problem Relation Age of Onset  ?? Diabetes Mother   ?? Hypertension Mother   ?? Heart disease Mother   ?     s/p 1 stent  ?? Hyperlipidemia Mother   ?? Arthritis Mother   ?? Cancer Father   ?     COLON  ?? Colon cancer Father 52  ?? Irritable bowel syndrome Sister   ?? Hyperlipidemia Daughter   ??  Hypertension Daughter   ?? Hypertension Maternal Grandmother   ?? Arthritis Maternal Grandmother   ?? Heart disease Maternal Grandfather   ?     MI  ?? Hypertension Maternal Grandfather   ?? Arthritis Maternal Grandfather   ?? Hypertension Son   ?? Esophageal cancer Neg Hx   ?  ?Past Surgical History:  ?Procedure Laterality Date  ?? ABDOMINAL HYSTERECTOMY    ? menorraghia, 2006, total  ?? CHOLECYSTECTOMY    ?? COLONOSCOPY  2018  ? [facility] per patient  ?? ESOPHAGOGASTRODUODENOSCOPY  2018  ? [facility]   ?? KNEE SURGERY    ? right, repair torn torn cartialage  ?? PACEMAKER INSERTION  08/2014  ?? TONSILLECTOMY    ?? TUBAL LIGATION    ? ?Social History  ? ?Occupational History  ?? [name]  ?Tobacco Use  ?? Smoking status: Never  ?? Smokeless tobacco: Never  ?Vaping Use  ?? Vaping Use: Never used  ?Substance and Sexual Activity  ?? Alcohol use: No  ?? Drug use: No  ?? Sexual activity: [name]  ? ? ? ? ? ? ?

## 2022-01-08 NOTE — Telephone Encounter (Signed)
Noted  

## 2022-01-15 ENCOUNTER — Ambulatory Visit: Payer: Medicare Other | Admitting: Family Medicine

## 2022-01-19 DIAGNOSIS — G8929 Other chronic pain: Secondary | ICD-10-CM | POA: Diagnosis not present

## 2022-01-19 DIAGNOSIS — E785 Hyperlipidemia, unspecified: Secondary | ICD-10-CM | POA: Diagnosis not present

## 2022-01-19 DIAGNOSIS — R109 Unspecified abdominal pain: Secondary | ICD-10-CM | POA: Diagnosis not present

## 2022-01-19 DIAGNOSIS — Z79899 Other long term (current) drug therapy: Secondary | ICD-10-CM | POA: Diagnosis not present

## 2022-01-19 DIAGNOSIS — I509 Heart failure, unspecified: Secondary | ICD-10-CM | POA: Diagnosis not present

## 2022-01-21 ENCOUNTER — Inpatient Hospital Stay: Payer: Medicare Other | Attending: Hematology & Oncology

## 2022-01-21 ENCOUNTER — Inpatient Hospital Stay (HOSPITAL_BASED_OUTPATIENT_CLINIC_OR_DEPARTMENT_OTHER): Payer: Medicare Other | Admitting: Hematology & Oncology

## 2022-01-21 ENCOUNTER — Other Ambulatory Visit: Payer: Self-pay

## 2022-01-21 ENCOUNTER — Encounter: Payer: Self-pay | Admitting: Hematology & Oncology

## 2022-01-21 VITALS — BP 155/91 | HR 87 | Temp 98.2°F | Resp 18 | Ht 65.0 in | Wt 197.8 lb

## 2022-01-21 DIAGNOSIS — C9000 Multiple myeloma not having achieved remission: Secondary | ICD-10-CM | POA: Diagnosis not present

## 2022-01-21 DIAGNOSIS — Z950 Presence of cardiac pacemaker: Secondary | ICD-10-CM | POA: Diagnosis not present

## 2022-01-21 DIAGNOSIS — Z79899 Other long term (current) drug therapy: Secondary | ICD-10-CM | POA: Diagnosis not present

## 2022-01-21 LAB — CBC WITH DIFFERENTIAL (CANCER CENTER ONLY)
Abs Immature Granulocytes: 0 10*3/uL (ref 0.00–0.07)
Basophils Absolute: 0 10*3/uL (ref 0.0–0.1)
Basophils Relative: 1 %
Eosinophils Absolute: 0.1 10*3/uL (ref 0.0–0.5)
Eosinophils Relative: 3 %
HCT: 37.3 % (ref 36.0–46.0)
Hemoglobin: 12 g/dL (ref 12.0–15.0)
Immature Granulocytes: 0 %
Lymphocytes Relative: 41 %
Lymphs Abs: 1.4 10*3/uL (ref 0.7–4.0)
MCH: 29.3 pg (ref 26.0–34.0)
MCHC: 32.2 g/dL (ref 30.0–36.0)
MCV: 91.2 fL (ref 80.0–100.0)
Monocytes Absolute: 0.3 10*3/uL (ref 0.1–1.0)
Monocytes Relative: 10 %
Neutro Abs: 1.5 10*3/uL — ABNORMAL LOW (ref 1.7–7.7)
Neutrophils Relative %: 45 %
Platelet Count: 104 10*3/uL — ABNORMAL LOW (ref 150–400)
RBC: 4.09 MIL/uL (ref 3.87–5.11)
RDW: 13.7 % (ref 11.5–15.5)
WBC Count: 3.3 10*3/uL — ABNORMAL LOW (ref 4.0–10.5)
nRBC: 0 % (ref 0.0–0.2)

## 2022-01-21 LAB — LACTATE DEHYDROGENASE: LDH: 216 U/L — ABNORMAL HIGH (ref 98–192)

## 2022-01-21 LAB — CMP (CANCER CENTER ONLY)
ALT: 12 U/L (ref 0–44)
AST: 22 U/L (ref 15–41)
Albumin: 3.7 g/dL (ref 3.5–5.0)
Alkaline Phosphatase: 46 U/L (ref 38–126)
Anion gap: 8 (ref 5–15)
BUN: 19 mg/dL (ref 8–23)
CO2: 25 mmol/L (ref 22–32)
Calcium: 9.7 mg/dL (ref 8.9–10.3)
Chloride: 108 mmol/L (ref 98–111)
Creatinine: 0.74 mg/dL (ref 0.44–1.00)
GFR, Estimated: >60 mL/min (ref >60)
Glucose, Bld: 100 mg/dL — ABNORMAL HIGH (ref 70–99)
Potassium: 3.6 mmol/L (ref 3.5–5.1)
Sodium: 141 mmol/L (ref 135–145)
Total Bilirubin: 0.3 mg/dL (ref 0.3–1.2)
Total Protein: 7.5 g/dL (ref 6.5–8.1)

## 2022-01-21 NOTE — Progress Notes (Signed)
?Hematology and Oncology Follow Up Visit ? ?Tami [name] ?[id number] ?Feb 13, 1956 66 y.o. ?01/21/2022 ? ? 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 ?Current Therapy:   ?RVD - s/p cycle 36 -- Revlimid d/c on 01/20/2019 ?Pomalidomide 2 mg po q day (21 on/7 off) -- started 02/11/2019 -- stopped on 10/31/2019 ?Faspro -- start on 11/22/2019 -- d/c due to headache  -- re-start on 09/05/2020 --status post cycle #3 -- d/c on 12/06/2020 due to toxicity ?  ?Interim History:  Tami [name] is here today for follow-up.  The big news is that she is got have a pacemaker placed.  She has 1 already but this is on his "last legs."  This can happen on 01/29/2022. ? ?Otherwise, everything is going okay with her.  She does have some fatigue.  She does get fatigued when she does activity.  She thinks is probably because the pacemaker is not working properly. ? ?Of note, she did have a endoscopy done.  This was done back in December.  There is no evidence of any malignancy.  She had a upper endoscopy and colonoscopy.  She had fragments of colonic mucosa with no colitis that was noted. ? ?As far as the myeloma is concerned, this is holding relatively stable.  Her monoclonal spike back in February was 1.6 g/dL.  Her IgG level was 2564 mg/dL.  The Kappa light chain was 2.6 mg/dL. ? ?She had 24-hour urine that was done back in February.  There is no monoclonal light chain in her urine.  Her kappa light chain excretion was 30.44 mg/L.   ? ?She has had no fever.  She has had no nausea or vomiting.  She has had no change in bowel or bladder habits.  There is been no leg swelling.  She has had no rashes. ? ?Overall, I would say performance status is ECOG 1.    ? ?Medications:  ?Allergies as of 01/21/2022   ? ?   Reactions  ? Benazepril Anaphylaxis, Swelling, Hives  ? angioedema ?Throat and lip swelling  ? Ondansetron Hcl Hives  ? Redness and hives post IV admin on 07/05/17  ? Codeine Nausea And Vomiting   ? Morphine Hives  ? Redness and hives noted post IV admin on 07/05/17  ? ?  ? ?  ?Medication List  ?  ? ?  ? Accurate as of January 21, 2022  1:59 PM. If you have any questions, ask your nurse or doctor.  ?  ?  ? ?  ? ?acetaminophen 500 MG tablet ?Commonly known as: TYLENOL ?Take 500 mg by mouth every 6 (six) hours as needed. ?  ?acyclovir 400 MG tablet ?Commonly known as: ZOVIRAX ?TAKE ONE (1) TABLET BY MOUTH TWO (2) TIMES DAILY ?  ?albuterol 108 (90 Base) MCG/ACT inhaler ?Commonly known as: VENTOLIN HFA ?Inhale 2 puffs into the lungs every 6 (six) hours as needed for wheezing or shortness of breath. ?  ?aspirin EC 81 MG tablet ?Take 1 tablet (81 mg total) by mouth daily. ?  ?budesonide-formoterol 160-4.5 MCG/ACT inhaler ?Commonly known as: SYMBICORT ?Inhale 2 puffs into the lungs as needed. ?  ?carvedilol 25 MG tablet ?Commonly known as: COREG ?Take 1 tablet (25 mg total) by mouth 2 (two) times daily with a meal. ?  ?celecoxib 100 MG capsule ?Commonly known as: CeleBREX ?Take 1 capsule (100 mg total) by mouth 2 (two) times daily as needed for mild pain or moderate pain (with food). ?  ?  cholestyramine 4 g packet ?Commonly known as: Questran ?Take 1 packet (4 g total) by mouth 2 (two) times daily. ?  ?cyclobenzaprine 10 MG tablet ?Commonly known as: FLEXERIL ?Take 1 tablet (10 mg total) by mouth at bedtime. ?What changed:  ?when to take this ?reasons to take this ?  ?dicyclomine 10 MG capsule ?Commonly known as: BENTYL ?Take 1 tab every 6 hours as needed for abdominal cramping. ?  ?diphenhydrAMINE 25 mg capsule ?Commonly known as: BENADRYL ?TAKE ONE (1) CAPSULE BY MOUTH THREE (3) TIMES EACH DAY AS NEEDED ?  ?diphenoxylate-atropine 2.5-0.025 MG tablet ?Commonly known as: Lomotil ?Take 1 tablet by mouth 4 (four) times daily as needed for diarrhea or loose stools. ?  ?erythromycin ophthalmic ointment ?1 application QID. ?  ?Ferrous Fumarate-Folic Acid 446-2 MG Tabs ?Take 324 mg by mouth daily. ?  ?fluticasone 50 MCG/ACT  nasal spray ?Commonly known as: FLONASE ?Place 2 sprays into both nostrils daily. ?What changed:  ?when to take this ?reasons to take this ?  ?fluticasone furoate-vilanterol 100-25 MCG/ACT Aepb ?Commonly known as: BREO ELLIPTA ?Inhale 1 puff into the lungs daily. ?What changed:  ?when to take this ?reasons to take this ?  ?furosemide 20 MG tablet ?Commonly known as: LASIX ?Take 1 tablet by mouth as needed. ?  ?HYDROcodone-acetaminophen 5-325 MG tablet ?Commonly known as: NORCO/VICODIN ?Take 1 tablet by mouth every 6 (six) hours as needed. ?  ?hydrOXYzine 25 MG tablet ?Commonly known as: ATARAX ?Take 25 mg by mouth as needed. ?  ?ibuprofen 800 MG tablet ?Commonly known as: ADVIL ?Take 1 tablet (800 mg total) by mouth 3 (three) times daily. ?What changed:  ?when to take this ?reasons to take this ?  ?loperamide 2 MG capsule ?Commonly known as: IMODIUM ?Take 2 mg by mouth as needed. ?  ?loratadine 10 MG tablet ?Commonly known as: CLARITIN ?Take 1 tablet (10 mg total) by mouth daily. ?  ?losartan 50 MG tablet ?Commonly known as: COZAAR ?Take 50 mg by mouth daily. ?  ?magnesium oxide 400 MG tablet ?Commonly known as: MAG-OX ?Take 1 tablet (400 mg total) by mouth 2 (two) times daily. ?  ?meclizine 12.5 MG tablet ?Commonly known as: ANTIVERT ?Take 12.5 mg by mouth as needed. ?  ?metoCLOPramide 10 MG tablet ?Commonly known as: REGLAN ?Take 10 mg by mouth as needed. ?  ?montelukast 10 MG tablet ?Commonly known as: Singulair ?Take 1 tablet (10 mg total) by mouth at bedtime. ?What changed:  ?when to take this ?reasons to take this ?  ?MULTIVITAMIN ADULT PO ?Take 1 tablet by mouth daily. ?  ?omeprazole 40 MG capsule ?Commonly known as: PRILOSEC ?Take 1 capsule (40 mg total) by mouth in the morning and at bedtime. ?  ?promethazine 25 MG tablet ?Commonly known as: PHENERGAN ?Take 1 tablet (25 mg total) by mouth every 8 (eight) hours as needed for nausea or vomiting (headache). ?  ?sucralfate 1 g tablet ?Commonly known as:  Carafate ?Take 1 tablet (1 g total) by mouth 3 (three) times daily as needed. prn ?  ?tiZANidine 4 MG tablet ?Commonly known as: Zanaflex ?Take 1 tablet (4 mg total) by mouth every 6 (six) hours as needed for muscle spasms. ?  ?topiramate 50 MG tablet ?Commonly known as: Topamax ?Take 1 tablet (50 mg total) by mouth 2 (two) times daily. ?  ?triamcinolone cream 0.1 % ?Commonly known as: KENALOG ?Apply 1 application topically 2 (two) times daily. ?  ?Vitamin D-3 25 MCG (1000 UT) Caps ?Take 1 capsule (  1,000 Units total) by mouth daily. ?  ?zinc sulfate 220 (50 Zn) MG capsule ?Take 220 mg by mouth 2 (two) times daily. ?  ? ?  ? ? ?Allergies:  ?Allergies  ?Allergen Reactions  ? Benazepril Anaphylaxis, Swelling and Hives  ?  angioedema ?Throat and lip swelling  ? Ondansetron Hcl Hives  ?  Redness and hives post IV admin on 07/05/17  ? Codeine Nausea And Vomiting  ? Morphine Hives  ?  Redness and hives noted post IV admin on 07/05/17  ? ? ?Past Medical History, Surgical history, Social history, and Family History were reviewed and updated. ? ?Review of Systems: ?Review of Systems  ?Constitutional: Negative.   ?HENT: Negative.    ?Eyes: Negative.   ?Respiratory: Negative.    ?Cardiovascular: Negative.   ?Gastrointestinal: Negative.   ?Genitourinary: Negative.   ?Musculoskeletal: Negative.   ?Skin: Negative.   ?Neurological: Negative.   ?Endo/Heme/Allergies: Negative.   ?Psychiatric/Behavioral: Negative.    ?  ? ?Physical Exam: ? vitals were not taken for this visit.  ? ?Wt Readings from Last 3 Encounters:  ?01/07/22 89.8 kg  ?01/02/22 88.9 kg  ?12/12/21 87.1 kg  ? ? ?Physical Exam ?Vitals reviewed.  ?HENT:  ?   Head: Normocephalic and atraumatic.  ?Eyes:  ?   Pupils: Pupils are equal, round, and reactive to light.  ?Cardiovascular:  ?   Rate and Rhythm: Normal rate and regular rhythm.  ?   Heart sounds: Normal heart sounds.  ?Pulmonary:  ?   Effort: Pulmonary effort is normal.  ?   Breath sounds: Normal breath sounds.   ?Abdominal:  ?   General: Bowel sounds are normal.  ?   Palpations: Abdomen is soft.  ?Musculoskeletal:     ?   General: No tenderness or deformity. Normal range of motion.  ?   Cervical back: Normal range of mo

## 2022-01-22 LAB — IGG, IGA, IGM
IgA: 111 mg/dL (ref 87–352)
IgG (Immunoglobin G), Serum: 2687 mg/dL — ABNORMAL HIGH (ref 586–1602)
IgM (Immunoglobulin M), Srm: 106 mg/dL (ref 26–217)

## 2022-01-22 LAB — KAPPA/LAMBDA LIGHT CHAINS
Kappa free light chain: 62.5 mg/L — ABNORMAL HIGH (ref 3.3–19.4)
Kappa, lambda light chain ratio: 14.88 — ABNORMAL HIGH (ref 0.26–1.65)
Lambda free light chains: 4.2 mg/L — ABNORMAL LOW (ref 5.7–26.3)

## 2022-01-22 LAB — IRON AND IRON BINDING CAPACITY (CC-WL,HP ONLY)
Iron: 57 ug/dL (ref 28–170)
Saturation Ratios: 16 % (ref 10.4–31.8)
TIBC: 365 ug/dL (ref 250–450)
UIBC: 308 ug/dL (ref 148–442)

## 2022-01-27 ENCOUNTER — Telehealth: Payer: Self-pay

## 2022-01-27 LAB — PROTEIN ELECTROPHORESIS, SERUM, WITH REFLEX
A/G Ratio: 0.7 (ref 0.7–1.7)
Albumin ELP: 3.1 g/dL (ref 2.9–4.4)
Alpha-1-Globulin: 0.3 g/dL (ref 0.0–0.4)
Alpha-2-Globulin: 1 g/dL (ref 0.4–1.0)
Beta Globulin: 1.1 g/dL (ref 0.7–1.3)
Gamma Globulin: 2.1 g/dL — ABNORMAL HIGH (ref 0.4–1.8)
Globulin, Total: 4.5 g/dL — ABNORMAL HIGH (ref 2.2–3.9)
M-Spike, %: 1.5 g/dL — ABNORMAL HIGH
SPEP Interpretation: 0
Total Protein ELP: 7.6 g/dL (ref 6.0–8.5)

## 2022-01-27 LAB — IMMUNOFIXATION REFLEX, SERUM
IgA: 105 mg/dL (ref 87–352)
IgG (Immunoglobin G), Serum: 2398 mg/dL — ABNORMAL HIGH (ref 586–1602)
IgM (Immunoglobulin M), Srm: 103 mg/dL (ref 26–217)

## 2022-01-27 NOTE — Telephone Encounter (Signed)
Called and informed patient of lab results, patient verbalized understanding and denies any questions or concerns at this time.   

## 2022-01-27 NOTE — Telephone Encounter (Signed)
-----   Message from [name], MD sent at 01/27/2022 12:36 PM EDT ----- ?Please call and tell her that the myeloma is holding nice and steady.  This is great news. ?

## 2022-01-28 DIAGNOSIS — I428 Other cardiomyopathies: Secondary | ICD-10-CM | POA: Diagnosis not present

## 2022-01-28 DIAGNOSIS — Z01818 Encounter for other preprocedural examination: Secondary | ICD-10-CM | POA: Diagnosis not present

## 2022-01-29 ENCOUNTER — Other Ambulatory Visit: Payer: Medicare Other

## 2022-01-29 ENCOUNTER — Ambulatory Visit: Payer: Medicare Other | Admitting: Hematology & Oncology

## 2022-01-29 ENCOUNTER — Inpatient Hospital Stay: Payer: Medicare Other

## 2022-01-29 DIAGNOSIS — I110 Hypertensive heart disease with heart failure: Secondary | ICD-10-CM | POA: Diagnosis not present

## 2022-01-29 DIAGNOSIS — I493 Ventricular premature depolarization: Secondary | ICD-10-CM | POA: Diagnosis not present

## 2022-01-29 DIAGNOSIS — Z79899 Other long term (current) drug therapy: Secondary | ICD-10-CM | POA: Diagnosis not present

## 2022-01-29 DIAGNOSIS — I3139 Other pericardial effusion (noninflammatory): Secondary | ICD-10-CM | POA: Diagnosis not present

## 2022-01-29 DIAGNOSIS — I428 Other cardiomyopathies: Secondary | ICD-10-CM | POA: Diagnosis not present

## 2022-01-29 DIAGNOSIS — T82110A Breakdown (mechanical) of cardiac electrode, initial encounter: Secondary | ICD-10-CM | POA: Diagnosis not present

## 2022-01-29 DIAGNOSIS — I5042 Chronic combined systolic (congestive) and diastolic (congestive) heart failure: Secondary | ICD-10-CM | POA: Diagnosis not present

## 2022-01-29 DIAGNOSIS — Z8679 Personal history of other diseases of the circulatory system: Secondary | ICD-10-CM | POA: Diagnosis not present

## 2022-01-29 DIAGNOSIS — C9001 Multiple myeloma in remission: Secondary | ICD-10-CM | POA: Diagnosis not present

## 2022-01-29 DIAGNOSIS — R9431 Abnormal electrocardiogram [ECG] [EKG]: Secondary | ICD-10-CM | POA: Diagnosis not present

## 2022-01-29 DIAGNOSIS — Q2112 Patent foramen ovale: Secondary | ICD-10-CM | POA: Diagnosis not present

## 2022-01-29 DIAGNOSIS — Z95810 Presence of automatic (implantable) cardiac defibrillator: Secondary | ICD-10-CM | POA: Diagnosis not present

## 2022-01-29 DIAGNOSIS — G4733 Obstructive sleep apnea (adult) (pediatric): Secondary | ICD-10-CM | POA: Diagnosis not present

## 2022-01-30 DIAGNOSIS — Q2112 Patent foramen ovale: Secondary | ICD-10-CM | POA: Diagnosis not present

## 2022-01-30 DIAGNOSIS — I493 Ventricular premature depolarization: Secondary | ICD-10-CM | POA: Diagnosis not present

## 2022-01-30 DIAGNOSIS — G4733 Obstructive sleep apnea (adult) (pediatric): Secondary | ICD-10-CM | POA: Diagnosis not present

## 2022-01-30 DIAGNOSIS — I428 Other cardiomyopathies: Secondary | ICD-10-CM | POA: Diagnosis not present

## 2022-01-30 DIAGNOSIS — C9001 Multiple myeloma in remission: Secondary | ICD-10-CM | POA: Diagnosis not present

## 2022-01-30 DIAGNOSIS — I5042 Chronic combined systolic (congestive) and diastolic (congestive) heart failure: Secondary | ICD-10-CM | POA: Diagnosis not present

## 2022-01-30 DIAGNOSIS — I517 Cardiomegaly: Secondary | ICD-10-CM | POA: Diagnosis not present

## 2022-01-30 DIAGNOSIS — I3139 Other pericardial effusion (noninflammatory): Secondary | ICD-10-CM | POA: Diagnosis not present

## 2022-01-30 DIAGNOSIS — Z8679 Personal history of other diseases of the circulatory system: Secondary | ICD-10-CM | POA: Diagnosis not present

## 2022-01-30 DIAGNOSIS — Z95810 Presence of automatic (implantable) cardiac defibrillator: Secondary | ICD-10-CM | POA: Diagnosis not present

## 2022-01-30 DIAGNOSIS — I110 Hypertensive heart disease with heart failure: Secondary | ICD-10-CM | POA: Diagnosis not present

## 2022-01-30 DIAGNOSIS — Z79899 Other long term (current) drug therapy: Secondary | ICD-10-CM | POA: Diagnosis not present

## 2022-01-30 DIAGNOSIS — R9431 Abnormal electrocardiogram [ECG] [EKG]: Secondary | ICD-10-CM | POA: Diagnosis not present

## 2022-01-30 DIAGNOSIS — I472 Ventricular tachycardia, unspecified: Secondary | ICD-10-CM | POA: Diagnosis not present

## 2022-01-30 DIAGNOSIS — T82110A Breakdown (mechanical) of cardiac electrode, initial encounter: Secondary | ICD-10-CM | POA: Diagnosis not present

## 2022-02-04 ENCOUNTER — Ambulatory Visit: Payer: Medicaid Other | Admitting: Neurology

## 2022-02-10 ENCOUNTER — Inpatient Hospital Stay: Payer: Medicare Other | Admitting: Family Medicine

## 2022-02-16 ENCOUNTER — Telehealth: Payer: Self-pay | Admitting: Family Medicine

## 2022-02-16 ENCOUNTER — Inpatient Hospital Stay: Payer: Medicare Other | Admitting: Medical

## 2022-02-16 NOTE — Telephone Encounter (Signed)
Reiterated importance of having a hospital follow up. Pt still only wants to see Dr. Charlett Blake. Pt scheduled for first available, 04/21/22. ?

## 2022-02-16 NOTE — Telephone Encounter (Signed)
Pt refuses to see another provider. She had a HFU scheduled with Percell Miller this morning, but cancelled. Stated she was rather wait till Dr.Blyth is available. Next available is June. Advised pt it is usually recommended to be seen for a HFU within a week of being discharged.  ?

## 2022-02-27 ENCOUNTER — Encounter: Payer: Self-pay | Admitting: Hematology & Oncology

## 2022-02-27 ENCOUNTER — Inpatient Hospital Stay (HOSPITAL_BASED_OUTPATIENT_CLINIC_OR_DEPARTMENT_OTHER): Payer: Medicare Other | Admitting: Hematology & Oncology

## 2022-02-27 ENCOUNTER — Inpatient Hospital Stay: Payer: Medicare Other | Attending: Hematology & Oncology

## 2022-02-27 ENCOUNTER — Other Ambulatory Visit: Payer: Self-pay

## 2022-02-27 VITALS — BP 154/99 | HR 92 | Temp 97.8°F | Resp 18 | Wt 199.0 lb

## 2022-02-27 DIAGNOSIS — Z950 Presence of cardiac pacemaker: Secondary | ICD-10-CM | POA: Diagnosis not present

## 2022-02-27 DIAGNOSIS — C9000 Multiple myeloma not having achieved remission: Secondary | ICD-10-CM

## 2022-02-27 LAB — CMP (CANCER CENTER ONLY)
ALT: 13 U/L (ref 0–44)
AST: 23 U/L (ref 15–41)
Albumin: 3.6 g/dL (ref 3.5–5.0)
Alkaline Phosphatase: 44 U/L (ref 38–126)
Anion gap: 6 (ref 5–15)
BUN: 18 mg/dL (ref 8–23)
CO2: 27 mmol/L (ref 22–32)
Calcium: 9.3 mg/dL (ref 8.9–10.3)
Chloride: 106 mmol/L (ref 98–111)
Creatinine: 0.61 mg/dL (ref 0.44–1.00)
GFR, Estimated: >60 mL/min (ref >60)
Glucose, Bld: 89 mg/dL (ref 70–99)
Potassium: 3.7 mmol/L (ref 3.5–5.1)
Sodium: 139 mmol/L (ref 135–145)
Total Bilirubin: 0.4 mg/dL (ref 0.3–1.2)
Total Protein: 8.3 g/dL — ABNORMAL HIGH (ref 6.5–8.1)

## 2022-02-27 LAB — CBC WITH DIFFERENTIAL (CANCER CENTER ONLY)
Abs Immature Granulocytes: 0 10*3/uL (ref 0.00–0.07)
Basophils Absolute: 0 10*3/uL (ref 0.0–0.1)
Basophils Relative: 1 %
Eosinophils Absolute: 0.1 10*3/uL (ref 0.0–0.5)
Eosinophils Relative: 4 %
HCT: 34 % — ABNORMAL LOW (ref 36.0–46.0)
Hemoglobin: 11 g/dL — ABNORMAL LOW (ref 12.0–15.0)
Immature Granulocytes: 0 %
Lymphocytes Relative: 47 %
Lymphs Abs: 1.2 10*3/uL (ref 0.7–4.0)
MCH: 28.9 pg (ref 26.0–34.0)
MCHC: 32.4 g/dL (ref 30.0–36.0)
MCV: 89.5 fL (ref 80.0–100.0)
Monocytes Absolute: 0.3 10*3/uL (ref 0.1–1.0)
Monocytes Relative: 10 %
Neutro Abs: 0.9 10*3/uL — ABNORMAL LOW (ref 1.7–7.7)
Neutrophils Relative %: 38 %
Platelet Count: 111 10*3/uL — ABNORMAL LOW (ref 150–400)
RBC: 3.8 MIL/uL — ABNORMAL LOW (ref 3.87–5.11)
RDW: 13.1 % (ref 11.5–15.5)
WBC Count: 2.5 10*3/uL — ABNORMAL LOW (ref 4.0–10.5)
nRBC: 0 % (ref 0.0–0.2)

## 2022-02-27 LAB — IRON AND IRON BINDING CAPACITY (CC-WL,HP ONLY)
Iron: 72 ug/dL (ref 28–170)
Saturation Ratios: 20 % (ref 10.4–31.8)
TIBC: 363 ug/dL (ref 250–450)
UIBC: 291 ug/dL (ref 148–442)

## 2022-02-27 LAB — LACTATE DEHYDROGENASE: LDH: 215 U/L — ABNORMAL HIGH (ref 98–192)

## 2022-02-27 LAB — FERRITIN: Ferritin: 472 ng/mL — ABNORMAL HIGH (ref 11–307)

## 2022-02-27 NOTE — Progress Notes (Signed)
?Hematology and Oncology Follow Up Visit ? ?Tami [name] ?[id number] ?1956/06/12 66 y.o. ?02/27/2022 ? ? 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 ?Current Therapy:   ?RVD - s/p cycle 36 -- Revlimid d/c on 01/20/2019 ?Pomalidomide 2 mg po q day (21 on/7 off) -- started 02/11/2019 -- stopped on 10/31/2019 ?Faspro -- start on 11/22/2019 -- d/c due to headache  -- re-start on 09/05/2020 --status post cycle #3 -- d/c on 12/06/2020 due to toxicity ?  ?Interim History:  Tami [name] is here today for follow-up.  She had new pacemaker put in.  Apparently, there was some complications with put in.  However, it did get put in.  She did well with this.  She says she feels better with the new pacemaker.  She says that she feels like she has more energy. ? ?When we last saw her back in February, her M spike was 1.5 g/dL.  Her IgG level was 2500 mg/dL.  The Kappa light chain went up a little bit at 6.3 mg/dL. ? ?She is doing a 24-hour urine for [place] today. ? ?She did not have an any nausea or vomiting.  She is having a little bit of diarrhea.  There is no bleeding. ? ?She is having no problems with fever.  She is having no cough or shortness of breath. ? ?Overall, I would say performance status is probably ECOG 1.   ? ?Medications:  ?Allergies as of 02/27/2022   ? ?   Reactions  ? Benazepril Anaphylaxis, Swelling, Hives  ? angioedema ?Throat and lip swelling  ? Ondansetron Hcl Hives  ? Redness and hives post IV admin on 07/05/17  ? Codeine Nausea And Vomiting  ? Morphine Hives  ? Redness and hives noted post IV admin on 07/05/17  ? ?  ? ?  ?Medication List  ?  ? ?  ? Accurate as of February 27, 2022  8:04 AM. If you have any questions, ask your nurse or doctor.  ?  ?  ? ?  ? ?acetaminophen 500 MG tablet ?Commonly known as: TYLENOL ?Take 500 mg by mouth every 6 (six) hours as needed. ?  ?acyclovir 400 MG tablet ?Commonly known as: ZOVIRAX ?TAKE ONE (1) TABLET BY MOUTH TWO (2) TIMES DAILY ?   ?albuterol 108 (90 Base) MCG/ACT inhaler ?Commonly known as: VENTOLIN HFA ?Inhale 2 puffs into the lungs every 6 (six) hours as needed for wheezing or shortness of breath. ?  ?aspirin EC 81 MG tablet ?Take 1 tablet (81 mg total) by mouth daily. ?  ?budesonide-formoterol 160-4.5 MCG/ACT inhaler ?Commonly known as: SYMBICORT ?Inhale 2 puffs into the lungs as needed. ?  ?carvedilol 25 MG tablet ?Commonly known as: COREG ?Take 1 tablet (25 mg total) by mouth 2 (two) times daily with a meal. ?  ?celecoxib 100 MG capsule ?Commonly known as: CeleBREX ?Take 1 capsule (100 mg total) by mouth 2 (two) times daily as needed for mild pain or moderate pain (with food). ?  ?cholestyramine 4 g packet ?Commonly known as: Questran ?Take 1 packet (4 g total) by mouth 2 (two) times daily. ?  ?cyclobenzaprine 10 MG tablet ?Commonly known as: FLEXERIL ?Take 1 tablet (10 mg total) by mouth at bedtime. ?What changed:  ?when to take this ?reasons to take this ?  ?dicyclomine 10 MG capsule ?Commonly known as: BENTYL ?Take 1 tab every 6 hours as needed for abdominal cramping. ?  ?diphenhydrAMINE 25 mg capsule ?Commonly known  as: BENADRYL ?TAKE ONE (1) CAPSULE BY MOUTH THREE (3) TIMES EACH DAY AS NEEDED ?  ?diphenoxylate-atropine 2.5-0.025 MG tablet ?Commonly known as: Lomotil ?Take 1 tablet by mouth 4 (four) times daily as needed for diarrhea or loose stools. ?  ?erythromycin ophthalmic ointment ?1 application QID. ?  ?Ferrous Fumarate-Folic Acid 510-2 MG Tabs ?Take 324 mg by mouth daily. ?  ?fluticasone 50 MCG/ACT nasal spray ?Commonly known as: FLONASE ?Place 2 sprays into both nostrils daily. ?What changed:  ?when to take this ?reasons to take this ?  ?fluticasone furoate-vilanterol 100-25 MCG/ACT Aepb ?Commonly known as: BREO ELLIPTA ?Inhale 1 puff into the lungs daily. ?What changed:  ?when to take this ?reasons to take this ?  ?furosemide 20 MG tablet ?Commonly known as: LASIX ?Take 1 tablet by mouth as needed. ?   ?HYDROcodone-acetaminophen 5-325 MG tablet ?Commonly known as: NORCO/VICODIN ?Take 1 tablet by mouth every 6 (six) hours as needed. ?  ?hydrOXYzine 25 MG tablet ?Commonly known as: ATARAX ?Take 25 mg by mouth as needed. ?  ?ibuprofen 800 MG tablet ?Commonly known as: ADVIL ?Take 1 tablet (800 mg total) by mouth 3 (three) times daily. ?What changed:  ?when to take this ?reasons to take this ?  ?loperamide 2 MG capsule ?Commonly known as: IMODIUM ?Take 2 mg by mouth as needed. ?  ?loratadine 10 MG tablet ?Commonly known as: CLARITIN ?Take 1 tablet (10 mg total) by mouth daily. ?  ?losartan 50 MG tablet ?Commonly known as: COZAAR ?Take 50 mg by mouth daily. ?  ?magnesium oxide 400 MG tablet ?Commonly known as: MAG-OX ?Take 1 tablet (400 mg total) by mouth 2 (two) times daily. ?  ?meclizine 12.5 MG tablet ?Commonly known as: ANTIVERT ?Take 12.5 mg by mouth as needed. ?  ?metoCLOPramide 10 MG tablet ?Commonly known as: REGLAN ?Take 10 mg by mouth as needed. ?  ?montelukast 10 MG tablet ?Commonly known as: Singulair ?Take 1 tablet (10 mg total) by mouth at bedtime. ?What changed:  ?when to take this ?reasons to take this ?  ?MULTIVITAMIN ADULT PO ?Take 1 tablet by mouth daily. ?  ?omeprazole 40 MG capsule ?Commonly known as: PRILOSEC ?Take 1 capsule (40 mg total) by mouth in the morning and at bedtime. ?  ?promethazine 25 MG tablet ?Commonly known as: PHENERGAN ?Take 1 tablet (25 mg total) by mouth every 8 (eight) hours as needed for nausea or vomiting (headache). ?  ?sucralfate 1 g tablet ?Commonly known as: Carafate ?Take 1 tablet (1 g total) by mouth 3 (three) times daily as needed. prn ?  ?tiZANidine 4 MG tablet ?Commonly known as: Zanaflex ?Take 1 tablet (4 mg total) by mouth every 6 (six) hours as needed for muscle spasms. ?  ?topiramate 50 MG tablet ?Commonly known as: Topamax ?Take 1 tablet (50 mg total) by mouth 2 (two) times daily. ?  ?triamcinolone cream 0.1 % ?Commonly known as: KENALOG ?Apply 1 application  topically 2 (two) times daily. ?  ?Vitamin D-3 25 MCG (1000 UT) Caps ?Take 1 capsule (1,000 Units total) by mouth daily. ?  ?zinc sulfate 220 (50 Zn) MG capsule ?Take 220 mg by mouth 2 (two) times daily. ?  ? ?  ? ? ?Allergies:  ?Allergies  ?Allergen Reactions  ? Benazepril Anaphylaxis, Swelling and Hives  ?  angioedema ?Throat and lip swelling  ? Ondansetron Hcl Hives  ?  Redness and hives post IV admin on 07/05/17  ? Codeine Nausea And Vomiting  ? Morphine Hives  ?  Redness and hives noted post IV admin on 07/05/17  ? ? ?Past Medical History, Surgical history, Social history, and Family History were reviewed and updated. ? ?Review of Systems: ?Review of Systems  ?Constitutional: Negative.   ?HENT: Negative.    ?Eyes: Negative.   ?Respiratory: Negative.    ?Cardiovascular: Negative.   ?Gastrointestinal: Negative.   ?Genitourinary: Negative.   ?Musculoskeletal: Negative.   ?Skin: Negative.   ?Neurological: Negative.   ?Endo/Heme/Allergies: Negative.   ?Psychiatric/Behavioral: Negative.    ?  ? ?Physical Exam: ? vitals were not taken for this visit.  ? ?Wt Readings from Last 3 Encounters:  ?01/21/22 197 lb 12.8 oz (89.7 kg)  ?01/07/22 198 lb (89.8 kg)  ?01/02/22 196 lb (88.9 kg)  ? ? ?Physical Exam ?Vitals reviewed.  ?HENT:  ?   Head: Normocephalic and atraumatic.  ?Eyes:  ?   Pupils: Pupils are equal, round, and reactive to light.  ?Cardiovascular:  ?   Rate and Rhythm: Normal rate and regular rhythm.  ?   Heart sounds: Normal heart sounds.  ?Pulmonary:  ?   Effort: Pulmonary effort is normal.  ?   Breath sounds: Normal breath sounds.  ?Abdominal:  ?   General: Bowel sounds are normal.  ?   Palpations: Abdomen is soft.  ?Musculoskeletal:     ?   General: No tenderness or deformity. Normal range of motion.  ?   Cervical back: Normal range of motion.  ?Lymphadenopathy:  ?   Cervical: No cervical adenopathy.  ?Skin: ?   General: Skin is warm and dry.  ?   Findings: No erythema or rash.  ?Neurological:  ?   Mental  Status: She is alert and oriented to person, place, and time.  ?Psychiatric:     ?   Behavior: Behavior normal.     ?   Thought Content: Thought content normal.     ?   Judgment: Judgment normal.  ? ? ? ?Lab Results  ?Component V

## 2022-02-28 LAB — IGG, IGA, IGM
IgA: 127 mg/dL (ref 87–352)
IgG (Immunoglobin G), Serum: 2361 mg/dL — ABNORMAL HIGH (ref 586–1602)
IgM (Immunoglobulin M), Srm: 107 mg/dL (ref 26–217)

## 2022-03-02 DIAGNOSIS — C9000 Multiple myeloma not having achieved remission: Secondary | ICD-10-CM | POA: Diagnosis not present

## 2022-03-02 DIAGNOSIS — Z950 Presence of cardiac pacemaker: Secondary | ICD-10-CM | POA: Diagnosis not present

## 2022-03-02 LAB — KAPPA/LAMBDA LIGHT CHAINS
Kappa free light chain: 68.3 mg/L — ABNORMAL HIGH (ref 3.3–19.4)
Kappa, lambda light chain ratio: 14.85 — ABNORMAL HIGH (ref 0.26–1.65)
Lambda free light chains: 4.6 mg/L — ABNORMAL LOW (ref 5.7–26.3)

## 2022-03-03 LAB — IMMUNOFIXATION REFLEX, SERUM
IgA: 154 mg/dL (ref 87–352)
IgG (Immunoglobin G), Serum: 2976 mg/dL — ABNORMAL HIGH (ref 586–1602)
IgM (Immunoglobulin M), Srm: 125 mg/dL (ref 26–217)

## 2022-03-03 LAB — PROTEIN ELECTROPHORESIS, SERUM, WITH REFLEX
A/G Ratio: 0.7 (ref 0.7–1.7)
Albumin ELP: 3.3 g/dL (ref 2.9–4.4)
Alpha-1-Globulin: 0.3 g/dL (ref 0.0–0.4)
Alpha-2-Globulin: 1 g/dL (ref 0.4–1.0)
Beta Globulin: 1.1 g/dL (ref 0.7–1.3)
Gamma Globulin: 2.3 g/dL — ABNORMAL HIGH (ref 0.4–1.8)
Globulin, Total: 4.6 g/dL — ABNORMAL HIGH (ref 2.2–3.9)
M-Spike, %: 1.3 g/dL — ABNORMAL HIGH
SPEP Interpretation: 0
Total Protein ELP: 7.9 g/dL (ref 6.0–8.5)

## 2022-03-04 ENCOUNTER — Telehealth: Payer: Self-pay

## 2022-03-04 LAB — UPEP/UIFE/LIGHT CHAINS/TP, 24-HR UR
% BETA, Urine: 42.1 %
ALPHA 1 URINE: 6.4 %
Albumin, U: 32.7 %
Alpha 2, Urine: 18.8 %
Free Kappa Lt Chains,Ur: 45.32 mg/L (ref 1.17–86.46)
Free Kappa/Lambda Ratio: 12.15 (ref 1.83–14.26)
Free Lambda Lt Chains,Ur: 3.73 mg/L (ref 0.27–15.21)
GAMMA GLOBULIN URINE: 0 %
Total Protein, Urine-Ur/day: 156 mg/24 hr — ABNORMAL HIGH (ref 30–150)
Total Protein, Urine: 16.4 mg/dL
Total Volume: 950

## 2022-03-04 NOTE — Telephone Encounter (Signed)
-----   Message from [name], MD sent at 03/03/2022  9:17 PM EDT ----- ?Call - the myeloma protein is still holding stable!!  [name] ?

## 2022-03-04 NOTE — Telephone Encounter (Signed)
LMOM advising pt of results - ok per DPR 

## 2022-03-05 ENCOUNTER — Telehealth: Payer: Self-pay

## 2022-03-05 NOTE — Telephone Encounter (Signed)
-----   Message from [name], MD sent at 03/04/2022  4:32 PM EDT ----- ?Please call and let her know that the urine shows a low level of myeloma protein.  Its not really all that bad.  Tami [name] ?

## 2022-03-05 NOTE — Telephone Encounter (Signed)
Called and informed patient of lab results via voicemail, patient to call back if she has any questions or concerns  ? ?

## 2022-03-11 ENCOUNTER — Ambulatory Visit: Payer: Medicaid Other | Admitting: Neurology

## 2022-03-12 ENCOUNTER — Other Ambulatory Visit: Payer: Self-pay | Admitting: Family Medicine

## 2022-03-13 ENCOUNTER — Other Ambulatory Visit: Payer: Self-pay | Admitting: Medical

## 2022-03-13 ENCOUNTER — Other Ambulatory Visit: Payer: Self-pay | Admitting: Hematology & Oncology

## 2022-03-13 DIAGNOSIS — C9000 Multiple myeloma not having achieved remission: Secondary | ICD-10-CM

## 2022-03-13 DIAGNOSIS — C9002 Multiple myeloma in relapse: Secondary | ICD-10-CM

## 2022-03-17 ENCOUNTER — Ambulatory Visit (INDEPENDENT_AMBULATORY_CARE_PROVIDER_SITE_OTHER): Payer: Medicare Other | Admitting: Family Medicine

## 2022-03-17 ENCOUNTER — Encounter: Payer: Self-pay | Admitting: Family Medicine

## 2022-03-17 ENCOUNTER — Other Ambulatory Visit (HOSPITAL_COMMUNITY)
Admission: RE | Admit: 2022-03-17 | Discharge: 2022-03-17 | Disposition: A | Discharge status: Home / Self Care | Payer: Medicare Other | Source: Ambulatory Visit | Attending: Family Medicine | Admitting: Family Medicine

## 2022-03-17 VITALS — BP 138/88 | HR 65 | Resp 20 | Ht 65.0 in | Wt 200.0 lb

## 2022-03-17 DIAGNOSIS — I10 Essential (primary) hypertension: Secondary | ICD-10-CM | POA: Diagnosis not present

## 2022-03-17 DIAGNOSIS — N760 Acute vaginitis: Secondary | ICD-10-CM | POA: Insufficient documentation

## 2022-03-17 DIAGNOSIS — R350 Frequency of micturition: Secondary | ICD-10-CM

## 2022-03-17 DIAGNOSIS — R399 Unspecified symptoms and signs involving the genitourinary system: Secondary | ICD-10-CM | POA: Diagnosis not present

## 2022-03-17 DIAGNOSIS — R8290 Unspecified abnormal findings in urine: Secondary | ICD-10-CM

## 2022-03-17 DIAGNOSIS — Z8619 Personal history of other infectious and parasitic diseases: Secondary | ICD-10-CM

## 2022-03-17 DIAGNOSIS — D649 Anemia, unspecified: Secondary | ICD-10-CM

## 2022-03-17 DIAGNOSIS — R739 Hyperglycemia, unspecified: Secondary | ICD-10-CM

## 2022-03-17 DIAGNOSIS — Z124 Encounter for screening for malignant neoplasm of cervix: Secondary | ICD-10-CM | POA: Diagnosis present

## 2022-03-17 DIAGNOSIS — R319 Hematuria, unspecified: Secondary | ICD-10-CM | POA: Diagnosis not present

## 2022-03-17 DIAGNOSIS — Z1151 Encounter for screening for human papillomavirus (HPV): Secondary | ICD-10-CM | POA: Diagnosis not present

## 2022-03-17 DIAGNOSIS — R197 Diarrhea, unspecified: Secondary | ICD-10-CM | POA: Diagnosis not present

## 2022-03-17 LAB — POCT URINALYSIS DIP (CLINITEK)
Bilirubin, UA: NEGATIVE
Blood, UA: NEGATIVE
Glucose, UA: NEGATIVE mg/dL
Ketones, POC UA: NEGATIVE mg/dL
Leukocytes, UA: NEGATIVE
Nitrite, UA: NEGATIVE
POC PROTEIN,UA: NEGATIVE
Spec Grav, UA: 1.025 (ref 1.010–1.025)
Urobilinogen, UA: 0.2 E.U./dL
pH, UA: 5 (ref 5.0–8.0)

## 2022-03-17 MED ORDER — AMOXICILLIN 500 MG PO CAPS: 500.0000 mg | ORAL_CAPSULE | Freq: Three times a day (TID) | ORAL | 0 refills | Status: AC

## 2022-03-17 NOTE — Patient Instructions (Signed)
Take a probiotic daily ? ?Urinary Tract Infection, Adult ?A urinary tract infection (UTI) is an infection of any part of the urinary tract. The urinary tract includes: ?The kidneys. ?The ureters. ?The bladder. ?The urethra. ?These organs make, store, and get rid of pee (urine) in the body. ?What are the causes? ?This infection is caused by germs (bacteria) in your genital area. These germs grow and cause swelling (inflammation) of your urinary tract. ?What increases the risk? ?The following factors may make you more likely to develop this condition: ?Using a small, thin tube (catheter) to drain pee. ?Not being able to control when you pee or poop (incontinence). ?Being female. If you are female, these things can increase the risk: ?Using these methods to prevent pregnancy: ?A medicine that kills sperm (spermicide). ?A device that blocks sperm (diaphragm). ?Having low levels of a female hormone (estrogen). ?Being pregnant. ?You are more likely to develop this condition if: ?You have genes that add to your risk. ?You are sexually active. ?You take antibiotic medicines. ?You have trouble peeing because of: ?A prostate that is bigger than normal, if you are female. ?A blockage in the part of your body that drains pee from the bladder. ?A kidney stone. ?A nerve condition that affects your bladder. ?Not getting enough to drink. ?Not peeing often enough. ?You have other conditions, such as: ?Diabetes. ?A weak disease-fighting system (immune system). ?Sickle cell disease. ?Gout. ?Injury of the spine. ?What are the signs or symptoms? ?Symptoms of this condition include: ?Needing to pee right away. ?Peeing small amounts often. ?Pain or burning when peeing. ?Blood in the pee. ?Pee that smells bad or not like normal. ?Trouble peeing. ?Pee that is cloudy. ?Fluid coming from the vagina, if you are female. ?Pain in the belly or lower back. ?Other symptoms include: ?Vomiting. ?Not feeling hungry. ?Feeling mixed up (confused). This  may be the first symptom in older adults. ?Being tired and grouchy (irritable). ?A fever. ?Watery poop (diarrhea). ?How is this treated? ?Taking antibiotic medicine. ?Taking other medicines. ?Drinking enough water. ?In some cases, you may need to see a specialist. ?Follow these instructions at home: ? ?Medicines ?Take over-the-counter and prescription medicines only as told by your doctor. ?If you were prescribed an antibiotic medicine, take it as told by your doctor. Do not stop taking it even if you start to feel better. ?General instructions ?Make sure you: ?Pee until your bladder is empty. ?Do not hold pee for a long time. ?Empty your bladder after sex. ?Wipe from front to back after peeing or pooping if you are a female. Use each tissue one time when you wipe. ?Drink enough fluid to keep your pee pale yellow. ?Keep all follow-up visits. ?Contact a doctor if: ?You do not get better after 1-2 days. ?Your symptoms go away and then come back. ?Get help right away if: ?You have very bad back pain. ?You have very bad pain in your lower belly. ?You have a fever. ?You have chills. ?You feeling like you will vomit or you vomit. ?Summary ?A urinary tract infection (UTI) is an infection of any part of the urinary tract. ?This condition is caused by germs in your genital area. ?There are many risk factors for a UTI. ?Treatment includes antibiotic medicines. ?Drink enough fluid to keep your pee pale yellow. ?This information is not intended to replace advice given to you by your health care provider. Make sure you discuss any questions you have with your health care provider. ?Document Revised: 06/07/2020 Document Reviewed:  06/07/2020 ?Elsevier Patient Education ? [place]. ? ?

## 2022-03-17 NOTE — Progress Notes (Signed)
? ?Subjective:  ? ?By signing my name below, I, [name], attest that this documentation has been prepared under the direction and in the presence of Mosie Lukes, MD. 03/17/2022  ?  ? ? [name], female    DOB: December 26, 1955, 65 y.o.   MRN: [id number] ? ?Chief Complaint  ?Patient presents with  ? Urinary Tract Infection  ? ? ?HPI ?Patient is in today for an office visit. ? ?She complains of urinary frequency, itchy vulva, smelly urine, urgency, fever, fatigue, back and abdominal pain and dysuria that started about 3 days ago. She is sexually active at this time and is not worried about any new partners.  ? ?She wakes up feeling exhausted in the morning. She often has to take 2 tablets of tylenol a day. ? ?She recently had a new pacemaker put in and reports she is feeling better and full of energy. ? ?Past Medical History:  ?Diagnosis Date  ? Abnormal SPEP 07/26/2016  ? Arthritis   ? knees, hands  ? Back pain 07/14/2016  ? Bowel obstruction ([place]) 11/2014  ? C. difficile diarrhea   ? CHF (congestive heart failure) ([place])   ? Colitis   ? Congestive heart failure ([place]) 02/24/2015  ? S/p pacemaker  ? Depression   ? Elevated sed rate 08/15/2017  ? Epigastric pain 03/22/2017  ? GERD (gastroesophageal reflux disease)   ? Heart disease   ? Hyperlipidemia   ? Hypertension   ? 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 Low back pain 07/14/2016  ? Monoclonal gammopathy of unknown significance (MGUS) 09/16/2016  ? Multiple myeloma ([place]) 07/20/2018  ? Multiple myeloma not having achieved remission ([place]) 07/20/2018  ? Myalgia 12/31/2016  ? Obstructive sleep apnea 03/31/2015  ? SOB (shortness of breath) 04/09/2016  ? Stroke [facility])   ? TIAs  ? UTI (urinary tract infection)   ? ? ?Past Surgical History:  ?Procedure Laterality Date  ? ABDOMINAL HYSTERECTOMY    ? menorraghia, 2006, total  ? CHOLECYSTECTOMY    ? COLONOSCOPY  2018  ? [facility] per patient  ? ESOPHAGOGASTRODUODENOSCOPY  2018  ? [facility]   ?  KNEE SURGERY    ? right, repair torn torn cartialage  ? PACEMAKER INSERTION  08/2014  ? TONSILLECTOMY    ? TUBAL LIGATION    ? ? ?Family History  ?Problem Relation Age of Onset  ? Diabetes Mother   ? Hypertension Mother   ? Heart disease Mother   ?     s/p 1 stent  ? Hyperlipidemia Mother   ? Arthritis Mother   ? Cancer Father   ?     COLON  ? Colon cancer Father 10  ? Irritable bowel syndrome Sister   ? Hyperlipidemia Daughter   ? Hypertension Daughter   ? Hypertension Maternal Grandmother   ? Arthritis Maternal Grandmother   ? Heart disease Maternal Grandfather   ?     MI  ? Hypertension Maternal Grandfather   ? Arthritis Maternal Grandfather   ? Hypertension Son   ? Esophageal cancer Neg Hx   ? ? ?Social History  ? ?Socioeconomic History  ? Marital status: Divorced  ?  Spouse name: [name]  ? Number of children: 3  ? Years of education: [name]  ? Highest education level: High school graduate  ?Occupational History  ? [name]  ?Tobacco Use  ? Smoking status: Never  ? Smokeless tobacco: Never  ?Vaping Use  ?  Vaping Use: Never used  ?Substance and Sexual Activity  ? Alcohol use: No  ? Drug use: No  ? Sexual activity: [name]  ?Other Topics Concern  ? [name]  ?Social History Narrative  ? Lives at home alone  ? Retired  ? Caffeine: coffee  ? ?Social Determinants of Health  ? ?Financial Resource Strain: Low Risk   ? Difficulty of Paying Living Expenses: Not hard at all  ?Food Insecurity: No Food Insecurity  ? Worried About Charity fundraiser in the Last Year: Never true  ? Ran Out of Food in the Last Year: Never true  ?Transportation Needs: No Transportation Needs  ? Lack of Transportation (Medical): No  ? Lack of Transportation (Non-Medical): No  ?Physical Activity: Insufficiently Active  ? Days of Exercise per Week: 2 days  ? Minutes of Exercise per Session: 30 min  ?Stress: No Stress Concern Present  ? Feeling of Stress : Not at all  ?Social Connections: Moderately Isolated  ? Frequency of  Communication with Friends and Family: More than three times a week  ? Frequency of Social Gatherings with Friends and Family: More than three times a week  ? Attends Religious Services: More than 4 times per year  ? Active Member of Clubs or Organizations: No  ? Attends Archivist Meetings: Never  ? Marital Status: Divorced  ?Intimate Partner Violence: Not At Risk  ? Fear of Current or Ex-Partner: No  ? Emotionally Abused: No  ? Physically Abused: No  ? Sexually Abused: No  ? ? ?Outpatient Medications Prior to Visit  ?Medication Sig Dispense Refill  ? acetaminophen (TYLENOL) 500 MG tablet Take 500 mg by mouth every 6 (six) hours as needed.    ? acyclovir (ZOVIRAX) 400 MG tablet TAKE ONE (1) TABLET BY MOUTH TWO (2) TIMES DAILY 60 tablet 1  ? albuterol (VENTOLIN HFA) 108 (90 Base) MCG/ACT inhaler Inhale 2 puffs into the lungs every 6 (six) hours as needed for wheezing or shortness of breath. 18 g 5  ? aspirin EC 81 MG tablet Take 1 tablet (81 mg total) by mouth daily. 90 tablet 1  ? budesonide-formoterol (SYMBICORT) 160-4.5 MCG/ACT inhaler Inhale 2 puffs into the lungs as needed. 1 each 3  ? carvedilol (COREG) 25 MG tablet Take 1 tablet (25 mg total) by mouth 2 (two) times daily with a meal. 180 tablet 1  ? celecoxib (CELEBREX) 100 MG capsule Take 1 capsule (100 mg total) by mouth 2 (two) times daily as needed for mild pain or moderate pain (with food). 60 capsule 3  ? Cholecalciferol (VITAMIN D-3) 25 MCG (1000 UT) CAPS Take 1 capsule (1,000 Units total) by mouth daily. 60 capsule 2  ? cholestyramine (QUESTRAN) 4 g packet Take 1 packet (4 g total) by mouth 2 (two) times daily. 60 each 3  ? cyclobenzaprine (FLEXERIL) 10 MG tablet Take 1 tablet (10 mg total) by mouth at bedtime. (Patient taking differently: Take 10 mg by mouth as needed.) 7 tablet 0  ? dicyclomine (BENTYL) 10 MG capsule Take 1 tab every 6 hours as needed for abdominal cramping. 60 capsule 0  ? diphenhydrAMINE (BENADRYL) 25 mg capsule TAKE ONE  (1) CAPSULE BY MOUTH THREE (3) TIMES EACH DAY AS NEEDED    ? diphenoxylate-atropine (LOMOTIL) 2.5-0.025 MG tablet TAKE ONE (1) TABLET BY MOUTH FOUR (4) TIMES DAILY AS NEEDED FOR DIARRHEA OR LOOSE STOOLS 40 tablet 0  ? erythromycin ophthalmic ointment 1 application QID.    ?  Ferrous Fumarate-Folic Acid 195-0 MG TABS Take 324 mg by mouth daily. 90 tablet 1  ? fluticasone (FLONASE) 50 MCG/ACT nasal spray Place 2 sprays into both nostrils daily. (Patient taking differently: Place 2 sprays into both nostrils as needed.) 16 g 0  ? fluticasone furoate-vilanterol (BREO ELLIPTA) 100-25 MCG/ACT AEPB Inhale 1 puff into the lungs daily. (Patient taking differently: Inhale 1 puff into the lungs as needed.) 1 each 11  ? furosemide (LASIX) 20 MG tablet Take 1 tablet by mouth as needed.    ? HYDROcodone-acetaminophen (NORCO/VICODIN) 5-325 MG tablet Take 1 tablet by mouth every 6 (six) hours as needed. 6 tablet 0  ? hydrOXYzine (ATARAX) 10 MG tablet TAKE ONE (1) TABLET BY MOUTH 3 TIMES DAILY AS NEEDED FOR ITCHING 30 tablet 0  ? hydrOXYzine (ATARAX/VISTARIL) 25 MG tablet Take 25 mg by mouth as needed.    ? ibuprofen (ADVIL) 800 MG tablet Take 1 tablet (800 mg total) by mouth 3 (three) times daily. (Patient taking differently: Take 800 mg by mouth as needed.) 21 tablet 0  ? loperamide (IMODIUM) 2 MG capsule Take 2 mg by mouth as needed.    ? loratadine (CLARITIN) 10 MG tablet Take 1 tablet (10 mg total) by mouth daily. 30 tablet 11  ? losartan (COZAAR) 50 MG tablet Take 50 mg by mouth daily.    ? magnesium oxide (MAG-OX) 400 MG tablet Take 1 tablet (400 mg total) by mouth 2 (two) times daily. 60 tablet 1  ? meclizine (ANTIVERT) 12.5 MG tablet Take 12.5 mg by mouth as needed.    ? metoCLOPramide (REGLAN) 10 MG tablet Take 10 mg by mouth as needed.    ? montelukast (SINGULAIR) 10 MG tablet Take 1 tablet (10 mg total) by mouth at bedtime. (Patient taking differently: Take 10 mg by mouth as needed.) 30 tablet 3  ? Multiple  Vitamins-Minerals (MULTIVITAMIN ADULT PO) Take 1 tablet by mouth daily.    ? omeprazole (PRILOSEC) 40 MG capsule Take 1 capsule (40 mg total) by mouth in the morning and at bedtime. 60 capsule 2  ? promethazine (PHENERG

## 2022-03-18 DIAGNOSIS — Z124 Encounter for screening for malignant neoplasm of cervix: Secondary | ICD-10-CM | POA: Insufficient documentation

## 2022-03-18 DIAGNOSIS — N760 Acute vaginitis: Secondary | ICD-10-CM | POA: Insufficient documentation

## 2022-03-18 NOTE — Assessment & Plan Note (Signed)
Avoid simple carbs and processed foods if worsens may need to repeat stool cultures. ?

## 2022-03-18 NOTE — Assessment & Plan Note (Signed)
Increase leafy greens, consider increased lean red meat and using cast iron cookware. Continue to monitor, report any concerns 

## 2022-03-18 NOTE — Assessment & Plan Note (Signed)
Resolved on dip today ?

## 2022-03-18 NOTE — Assessment & Plan Note (Signed)
Has been flared for several days with some dysuria, urgency but also hesitancy at times. Notes some malaise and low back and abdominal discomfort at times. Started on Amoxicillin 500 mg tid and urine specimen sent for culture. Consider BV also, vaginal swab sent for evaluation.  ?

## 2022-03-18 NOTE — Assessment & Plan Note (Signed)
Check vaginal cultures ?

## 2022-03-18 NOTE — Assessment & Plan Note (Signed)
Well controlled, no changes to meds. Encouraged heart healthy diet such as the DASH diet and exercise as tolerated.  °

## 2022-03-18 NOTE — Assessment & Plan Note (Signed)
hgba1c acceptable, minimize simple carbs. Increase exercise as tolerated.  

## 2022-03-19 LAB — CERVICOVAGINAL ANCILLARY ONLY
Bacterial Vaginitis (gardnerella): NEGATIVE
Candida Glabrata: NEGATIVE
Candida Vaginitis: NEGATIVE
Chlamydia: NEGATIVE
Comment: NEGATIVE
Comment: NEGATIVE
Comment: NEGATIVE
Comment: NEGATIVE
Comment: NEGATIVE
Comment: NORMAL
Neisseria Gonorrhea: NEGATIVE
Trichomonas: NEGATIVE

## 2022-03-19 LAB — URINE CULTURE
MICRO NUMBER:: 13371362
SPECIMEN QUALITY:: ADEQUATE

## 2022-03-20 NOTE — Addendum Note (Signed)
Addended byDamita Dunnings D on: 03/20/2022 11:15 AM ? ? Modules accepted: Orders ? ?

## 2022-03-20 NOTE — Addendum Note (Signed)
Addended by: Kelle Darting A on: 03/20/2022 12:08 PM ? ? Modules accepted: Orders ? ?

## 2022-03-24 LAB — CYTOLOGY - PAP
Comment: NEGATIVE
Diagnosis: UNDETERMINED — AB
High risk HPV: NEGATIVE

## 2022-03-26 ENCOUNTER — Inpatient Hospital Stay: Payer: Medicare Other | Attending: Hematology & Oncology

## 2022-03-26 ENCOUNTER — Inpatient Hospital Stay: Payer: Medicare Other | Admitting: Hematology & Oncology

## 2022-03-26 DIAGNOSIS — R197 Diarrhea, unspecified: Secondary | ICD-10-CM | POA: Insufficient documentation

## 2022-03-26 DIAGNOSIS — M79672 Pain in left foot: Secondary | ICD-10-CM | POA: Diagnosis not present

## 2022-03-26 DIAGNOSIS — C9000 Multiple myeloma not having achieved remission: Secondary | ICD-10-CM | POA: Insufficient documentation

## 2022-03-26 DIAGNOSIS — M79671 Pain in right foot: Secondary | ICD-10-CM | POA: Diagnosis not present

## 2022-03-26 DIAGNOSIS — L84 Corns and callosities: Secondary | ICD-10-CM | POA: Diagnosis not present

## 2022-03-26 DIAGNOSIS — Q6689 Other  specified congenital deformities of feet: Secondary | ICD-10-CM | POA: Diagnosis not present

## 2022-03-26 DIAGNOSIS — B351 Tinea unguium: Secondary | ICD-10-CM | POA: Diagnosis not present

## 2022-03-26 DIAGNOSIS — Z950 Presence of cardiac pacemaker: Secondary | ICD-10-CM | POA: Insufficient documentation

## 2022-03-27 ENCOUNTER — Other Ambulatory Visit: Payer: Medicare Other

## 2022-03-27 DIAGNOSIS — R197 Diarrhea, unspecified: Secondary | ICD-10-CM | POA: Diagnosis not present

## 2022-03-27 DIAGNOSIS — Z8619 Personal history of other infectious and parasitic diseases: Secondary | ICD-10-CM

## 2022-03-28 LAB — CLOSTRIDIUM DIFFICILE BY PCR: Toxigenic C. Difficile by PCR: NEGATIVE

## 2022-04-02 DIAGNOSIS — Z1231 Encounter for screening mammogram for malignant neoplasm of breast: Secondary | ICD-10-CM | POA: Diagnosis not present

## 2022-04-02 LAB — HM MAMMOGRAPHY

## 2022-04-03 ENCOUNTER — Inpatient Hospital Stay: Payer: Medicare Other

## 2022-04-03 ENCOUNTER — Encounter: Payer: Self-pay | Admitting: Family

## 2022-04-03 ENCOUNTER — Telehealth: Payer: Self-pay | Admitting: Family Medicine

## 2022-04-03 ENCOUNTER — Inpatient Hospital Stay (HOSPITAL_BASED_OUTPATIENT_CLINIC_OR_DEPARTMENT_OTHER): Payer: Medicare Other | Admitting: Family

## 2022-04-03 ENCOUNTER — Other Ambulatory Visit: Payer: Self-pay

## 2022-04-03 ENCOUNTER — Other Ambulatory Visit: Payer: Self-pay | Admitting: Family Medicine

## 2022-04-03 VITALS — BP 165/98 | HR 79 | Temp 98.5°F | Resp 18

## 2022-04-03 VITALS — BP 150/91 | HR 83 | Temp 98.0°F | Resp 18 | Ht 65.0 in | Wt 199.1 lb

## 2022-04-03 DIAGNOSIS — D801 Nonfamilial hypogammaglobulinemia: Secondary | ICD-10-CM

## 2022-04-03 DIAGNOSIS — C9000 Multiple myeloma not having achieved remission: Secondary | ICD-10-CM

## 2022-04-03 DIAGNOSIS — E860 Dehydration: Secondary | ICD-10-CM

## 2022-04-03 DIAGNOSIS — R197 Diarrhea, unspecified: Secondary | ICD-10-CM | POA: Diagnosis not present

## 2022-04-03 DIAGNOSIS — Z950 Presence of cardiac pacemaker: Secondary | ICD-10-CM | POA: Diagnosis not present

## 2022-04-03 LAB — CBC WITH DIFFERENTIAL (CANCER CENTER ONLY)
Abs Immature Granulocytes: 0 10*3/uL (ref 0.00–0.07)
Basophils Absolute: 0 10*3/uL (ref 0.0–0.1)
Basophils Relative: 1 %
Eosinophils Absolute: 0.1 10*3/uL (ref 0.0–0.5)
Eosinophils Relative: 2 %
HCT: 38 % (ref 36.0–46.0)
Hemoglobin: 12 g/dL (ref 12.0–15.0)
Immature Granulocytes: 0 %
Lymphocytes Relative: 48 %
Lymphs Abs: 1.3 10*3/uL (ref 0.7–4.0)
MCH: 28.6 pg (ref 26.0–34.0)
MCHC: 31.6 g/dL (ref 30.0–36.0)
MCV: 90.5 fL (ref 80.0–100.0)
Monocytes Absolute: 0.3 10*3/uL (ref 0.1–1.0)
Monocytes Relative: 11 %
Neutro Abs: 1 10*3/uL — ABNORMAL LOW (ref 1.7–7.7)
Neutrophils Relative %: 38 %
Platelet Count: 118 10*3/uL — ABNORMAL LOW (ref 150–400)
RBC: 4.2 MIL/uL (ref 3.87–5.11)
RDW: 13.1 % (ref 11.5–15.5)
WBC Count: 2.6 10*3/uL — ABNORMAL LOW (ref 4.0–10.5)
nRBC: 0 % (ref 0.0–0.2)

## 2022-04-03 LAB — CMP (CANCER CENTER ONLY)
ALT: 14 U/L (ref 0–44)
AST: 25 U/L (ref 15–41)
Albumin: 3.8 g/dL (ref 3.5–5.0)
Alkaline Phosphatase: 52 U/L (ref 38–126)
Anion gap: 7 (ref 5–15)
BUN: 18 mg/dL (ref 8–23)
CO2: 26 mmol/L (ref 22–32)
Calcium: 9.7 mg/dL (ref 8.9–10.3)
Chloride: 106 mmol/L (ref 98–111)
Creatinine: 0.67 mg/dL (ref 0.44–1.00)
GFR, Estimated: >60 mL/min (ref >60)
Glucose, Bld: 93 mg/dL (ref 70–99)
Potassium: 4 mmol/L (ref 3.5–5.1)
Sodium: 139 mmol/L (ref 135–145)
Total Bilirubin: 0.3 mg/dL (ref 0.3–1.2)
Total Protein: 8.8 g/dL — ABNORMAL HIGH (ref 6.5–8.1)

## 2022-04-03 LAB — LACTATE DEHYDROGENASE: LDH: 229 U/L — ABNORMAL HIGH (ref 98–192)

## 2022-04-03 MED ORDER — SODIUM CHLORIDE 0.9 % IV SOLN: Freq: Once | INTRAVENOUS | Status: AC

## 2022-04-03 NOTE — Progress Notes (Signed)
Hematology and Oncology Follow Up Visit  Tami [name] [id number] June 29, 1956 66 y.o. 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 She is doing fairly well but states that she has been having diarrhea. Her PCP had her start taking Lomotil and Questran. She had stopped and the diarrhea has returned. She state that she has already had 5 BMs today.  She is doing her best to stay well hydrated and has maintained a good appetite. Her weight is stable at 199 lbs.  M-spike in April was 1.3 g/dL, IgG level was 2,361 mg/dL and kappa light chains 6.83 mg/dL. 24 hour urine showed myeloma proteins to be stable.   No fever, chills, n/v, cough, rash, dizziness, SOB, chest pain, palpitations, abdominal pain or changes in bowel or bladder habits.  No abnormal blood loss, bruising or petechiae.  No swelling, tenderness, numbness or tingling in her extremities at this time.  No falls or syncope to report.   ECOG Performance Status: 1 - Symptomatic but completely ambulatory  Medications:  Allergies as of 04/03/2022       Reactions   Benazepril Anaphylaxis, Swelling, Hives   angioedema Throat and lip swelling   Ondansetron Hcl Hives   Redness and hives post IV admin on 07/05/17   Codeine Nausea And Vomiting   Morphine Hives   Redness and hives noted post IV admin on 07/05/17        Medication List        Accurate as of Apr 03, 2022  1:28 PM. If you have any questions, ask your nurse or doctor.          acetaminophen 500 MG tablet Commonly known as:  TYLENOL Take 500 mg by mouth every 6 (six) hours as needed.   acyclovir 400 MG tablet Commonly known as: ZOVIRAX TAKE ONE (1) TABLET BY MOUTH TWO (2) TIMES DAILY   albuterol 108 (90 Base) MCG/ACT inhaler Commonly known as: VENTOLIN HFA Inhale 2 puffs into the lungs every 6 (six) hours as needed for wheezing or shortness of breath.   aspirin EC 81 MG tablet Take 1 tablet (81 mg total) by mouth daily.   budesonide-formoterol 160-4.5 MCG/ACT inhaler Commonly known as: SYMBICORT Inhale 2 puffs into the lungs as needed.   carvedilol 25 MG tablet Commonly known as: COREG Take 1 tablet (25 mg total) by mouth 2 (two) times daily with a meal.   celecoxib 100 MG capsule Commonly known as: CeleBREX Take 1 capsule (100 mg total) by mouth 2 (two) times daily as needed for mild pain or moderate pain (with food).   cholestyramine 4 g packet Commonly known as: Questran Take 1 packet (4 g total) by mouth 2 (two) times daily.   cyclobenzaprine 10 MG tablet Commonly known as: FLEXERIL Take 1 tablet (10 mg total) by mouth at bedtime. What changed:  when to take this reasons to take this   dicyclomine 10 MG capsule Commonly known as: BENTYL Take 1 tab every 6 hours as needed  for abdominal cramping.   diphenhydrAMINE 25 mg capsule Commonly known as: BENADRYL TAKE ONE (1) CAPSULE BY MOUTH THREE (3) TIMES EACH DAY AS NEEDED   diphenoxylate-atropine 2.5-0.025 MG tablet Commonly known as: LOMOTIL TAKE ONE (1) TABLET BY MOUTH FOUR (4) TIMES DAILY AS NEEDED FOR DIARRHEA OR LOOSE STOOLS   erythromycin ophthalmic ointment 1 application QID.   Ferrous Fumarate-Folic Acid 299-2 MG Tabs Take 324 mg by mouth daily.   fluticasone 50 MCG/ACT nasal spray Commonly known as: FLONASE Place 2 sprays into both nostrils daily. What changed:  when to take this reasons to take this   fluticasone furoate-vilanterol 100-25 MCG/ACT Aepb Commonly known as: BREO ELLIPTA Inhale 1 puff into the lungs  daily. What changed:  when to take this reasons to take this   furosemide 20 MG tablet Commonly known as: LASIX Take 1 tablet by mouth as needed.   HYDROcodone-acetaminophen 5-325 MG tablet Commonly known as: NORCO/VICODIN Take 1 tablet by mouth every 6 (six) hours as needed.   hydrOXYzine 25 MG tablet Commonly known as: ATARAX Take 25 mg by mouth as needed.   hydrOXYzine 10 MG tablet Commonly known as: ATARAX TAKE ONE (1) TABLET BY MOUTH 3 TIMES DAILY AS NEEDED FOR ITCHING   ibuprofen 800 MG tablet Commonly known as: ADVIL Take 1 tablet (800 mg total) by mouth 3 (three) times daily. What changed:  when to take this reasons to take this   loperamide 2 MG capsule Commonly known as: IMODIUM Take 2 mg by mouth as needed.   loratadine 10 MG tablet Commonly known as: CLARITIN Take 1 tablet (10 mg total) by mouth daily.   losartan 50 MG tablet Commonly known as: COZAAR Take 50 mg by mouth daily.   magnesium oxide 400 MG tablet Commonly known as: MAG-OX Take 1 tablet (400 mg total) by mouth 2 (two) times daily.   meclizine 12.5 MG tablet Commonly known as: ANTIVERT Take 12.5 mg by mouth as needed.   metoCLOPramide 10 MG tablet Commonly known as: REGLAN Take 10 mg by mouth as needed.   montelukast 10 MG tablet Commonly known as: Singulair Take 1 tablet (10 mg total) by mouth at bedtime. What changed:  when to take this reasons to take this   MULTIVITAMIN ADULT PO Take 1 tablet by mouth daily.   omeprazole 40 MG capsule Commonly known as: PRILOSEC Take 1 capsule (40 mg total) by mouth in the morning and at bedtime.   promethazine 25 MG tablet Commonly known as: PHENERGAN Take 1 tablet (25 mg total) by mouth every 8 (eight) hours as needed for nausea or vomiting (headache).   sucralfate 1 g tablet Commonly known as: Carafate Take 1 tablet (1 g total) by mouth 3 (three) times daily as needed. prn   tiZANidine 4 MG tablet Commonly known as:  Zanaflex Take 1 tablet (4 mg total) by mouth every 6 (six) hours as needed for muscle spasms.   topiramate 50 MG tablet Commonly known as: Topamax Take 1 tablet (50 mg total) by mouth 2 (two) times daily.   triamcinolone cream 0.1 % Commonly known as: KENALOG Apply 1 application topically 2 (two) times daily.   Vitamin D-3 25 MCG (1000 UT) Caps Take 1 capsule (1,000 Units total) by mouth daily.   zinc sulfate 220 (50 Zn) MG capsule Take 220 mg by mouth 2 (two) times daily.        Allergies:  Allergies  Allergen Reactions   Benazepril Anaphylaxis, Swelling and Hives  angioedema Throat and lip swelling   Ondansetron Hcl Hives    Redness and hives post IV admin on 07/05/17   Codeine Nausea And Vomiting   Morphine Hives    Redness and hives noted post IV admin on 07/05/17    Past Medical History, Surgical history, Social history, and Family History were reviewed and updated.  Review of Systems: All other 10 point review of systems is negative.   Physical Exam:  vitals were not taken for this visit.   Wt Readings from Last 3 Encounters:  03/17/22 200 lb (90.7 kg)  02/27/22 199 lb (90.3 kg)  01/21/22 197 lb 12.8 oz (89.7 kg)    Ocular: Sclerae unicteric, pupils equal, round and reactive to light Ear-nose-throat: Oropharynx clear, dentition fair Lymphatic: No cervical or supraclavicular adenopathy Lungs no rales or rhonchi, good excursion bilaterally Heart regular rate and rhythm, no murmur appreciated Abd soft, nontender, positive bowel sounds MSK no focal spinal tenderness, no joint edema Neuro: non-focal, well-oriented, appropriate affect Breasts: Deferred   Lab Results  Component Value Date   WBC 2.6 (L) 04/03/2022   HGB 12.0 04/03/2022   HCT 38.0 04/03/2022   MCV 90.5 04/03/2022   PLT 118 (L) 04/03/2022   Lab Results  Component Value Date   FERRITIN 472 (H) 02/27/2022   IRON 72 02/27/2022   TIBC 363 02/27/2022   UIBC 291 02/27/2022   IRONPCTSAT  20 02/27/2022   Lab Results  Component Value Date   RETICCTPCT 0.8 07/16/2016   RBC 4.20 04/03/2022   RETICCTABS 32,640 07/16/2016   Lab Results  Component Value Date   KPAFRELGTCHN 68.3 (H) 02/27/2022   LAMBDASER 4.6 (L) 02/27/2022   KAPLAMBRATIO 12.15 03/02/2022   Lab Results  Component Value Date   IGGSERUM 2,361 (H) 02/27/2022   IGGSERUM 2,976 (H) 02/27/2022   IGA 127 02/27/2022   IGA 154 02/27/2022   IGMSERUM 107 02/27/2022   IGMSERUM 125 02/27/2022   Lab Results  Component Value Date   TOTALPROTELP 7.9 02/27/2022   ALBUMINELP 3.3 02/27/2022   A1GS 0.3 02/27/2022   A2GS 1.0 02/27/2022   BETS 1.1 02/27/2022   BETA2SER 0.5 07/16/2016   GAMS 2.3 (H) 02/27/2022   MSPIKE 1.3 (H) 02/27/2022   SPEI Comment 12/19/2020     Chemistry      Component Value Date/Time   NA 139 02/27/2022 0741   NA 143 11/08/2017 1127   NA 140 12/31/2016 1000   K 3.7 02/27/2022 0741   K 4.0 11/08/2017 1127   K 3.6 12/31/2016 1000   CL 106 02/27/2022 0741   CL 107 11/08/2017 1127   CO2 27 02/27/2022 0741   CO2 25 11/08/2017 1127   CO2 24 12/31/2016 1000   BUN 18 02/27/2022 0741   BUN 19 11/08/2017 1127   BUN 22.3 12/31/2016 1000   CREATININE 0.61 02/27/2022 0741   CREATININE 0.9 11/08/2017 1127   CREATININE 0.8 12/31/2016 1000      Component Value Date/Time   CALCIUM 9.3 02/27/2022 0741   CALCIUM 9.9 11/08/2017 1127   CALCIUM 9.5 12/31/2016 1000   ALKPHOS 44 02/27/2022 0741   ALKPHOS 44 11/08/2017 1127   ALKPHOS 51 12/31/2016 1000   AST 23 02/27/2022 0741   AST 28 12/31/2016 1000   ALT 13 02/27/2022 0741   ALT 36 11/08/2017 1127   ALT 16 12/31/2016 1000   BILITOT 0.4 02/27/2022 0741   BILITOT 0.46 12/31/2016 1000       Impression and Plan: Ms. Weigand is  a very pleasant 66 yo [place] American female with a previous IgG kappa MGUS with progression to myeloma.  She continues to recuperate nicely since having her new pacemaker placed.  Her only complaint at this time is  diarrhea. She will restart her lomotil as prescribed along with Questran.  We will give her IV fluids today as she states that she does feel a little dehydrated.  Follow-up in 6 weeks.   Lottie Dawson, NP 5/26/20231:28 PM

## 2022-04-03 NOTE — Telephone Encounter (Signed)
Patient states he stool is still not normal and would like to know what else she could take for it. Please advise.

## 2022-04-03 NOTE — Telephone Encounter (Signed)
Requesting: Lomotil 2.5-0.'025mg'$  Contract: N/A UDS: N/A Last Visit: 03/17/22 Next Visit: 04/21/22 Last Refill: 03/13/22 #40 and 0RF  Please Advise

## 2022-04-03 NOTE — Patient Instructions (Signed)

## 2022-04-06 LAB — IGG, IGA, IGM
IgA: 134 mg/dL (ref 87–352)
IgG (Immunoglobin G), Serum: 2998 mg/dL — ABNORMAL HIGH (ref 586–1602)
IgM (Immunoglobulin M), Srm: 121 mg/dL (ref 26–217)

## 2022-04-07 LAB — KAPPA/LAMBDA LIGHT CHAINS
Kappa free light chain: 70 mg/L — ABNORMAL HIGH (ref 3.3–19.4)
Kappa, lambda light chain ratio: 17.07 — ABNORMAL HIGH (ref 0.26–1.65)
Lambda free light chains: 4.1 mg/L — ABNORMAL LOW (ref 5.7–26.3)

## 2022-04-07 NOTE — Telephone Encounter (Signed)
Called pt x2. No answer. Left VM asking for return call.

## 2022-04-09 ENCOUNTER — Ambulatory Visit (INDEPENDENT_AMBULATORY_CARE_PROVIDER_SITE_OTHER): Payer: Medicare Other | Admitting: Family Medicine

## 2022-04-09 VITALS — BP 132/88 | HR 96 | Temp 97.6°F | Resp 18 | Ht 65.0 in | Wt 199.4 lb

## 2022-04-09 DIAGNOSIS — R109 Unspecified abdominal pain: Secondary | ICD-10-CM

## 2022-04-09 DIAGNOSIS — R197 Diarrhea, unspecified: Secondary | ICD-10-CM | POA: Diagnosis not present

## 2022-04-09 DIAGNOSIS — G4733 Obstructive sleep apnea (adult) (pediatric): Secondary | ICD-10-CM | POA: Diagnosis not present

## 2022-04-09 DIAGNOSIS — R350 Frequency of micturition: Secondary | ICD-10-CM | POA: Diagnosis not present

## 2022-04-09 LAB — IMMUNOFIXATION REFLEX, SERUM
IgA: 146 mg/dL (ref 87–352)
IgG (Immunoglobin G), Serum: 3064 mg/dL — ABNORMAL HIGH (ref 586–1602)
IgM (Immunoglobulin M), Srm: 115 mg/dL (ref 26–217)

## 2022-04-09 LAB — PROTEIN ELECTROPHORESIS, SERUM, WITH REFLEX
A/G Ratio: 0.8 (ref 0.7–1.7)
Albumin ELP: 3.6 g/dL (ref 2.9–4.4)
Alpha-1-Globulin: 0.2 g/dL (ref 0.0–0.4)
Alpha-2-Globulin: 1 g/dL (ref 0.4–1.0)
Beta Globulin: 1.2 g/dL (ref 0.7–1.3)
Gamma Globulin: 2.5 g/dL — ABNORMAL HIGH (ref 0.4–1.8)
Globulin, Total: 4.8 g/dL — ABNORMAL HIGH (ref 2.2–3.9)
M-Spike, %: 1.6 g/dL — ABNORMAL HIGH
SPEP Interpretation: 0
Total Protein ELP: 8.4 g/dL (ref 6.0–8.5)

## 2022-04-09 NOTE — Patient Instructions (Addendum)
Please give Dr Dorrene German a call and schedule a follow-up visit asap   [address] [place], [place] [address] [phone]  Please stop by the lab to get a stool culture kit We will get your CT set up asap- I hope this will give [place] a diagnosis for you  Please bring in a urine sample for culture as soon as you are able

## 2022-04-09 NOTE — Progress Notes (Addendum)
[facility] [place] [address], [address] [place], [place] [address] [phone] Fax [phone]  Date:  04/09/2022   Name:  Tami [name]   DOB:  08/16/1956   MRN:  [id number]  PCP:  Blyth, Stacey A, MD    Chief Complaint: Diarrhea (X 3 weeks. She had some fluids last week. Some abdominal pain at times. )   History of Present Illness:  Tami [name] is [name] 65 y.o. very pleasant female patient who presents with the following:  Pt seen today with concern of diarrhea which started 3 weeks ago Pt of Tami Blyth - I last saw her about 2 years ago  She has history of multiple myeloma and cardiac disease, was seen by hematology last week Per note by Tami [name]:  Interim History:  Tami [name] is here today for follow-up. She is doing fairly well but states that she has been having diarrhea. Her PCP had her start taking Lomotil and Questran. She had stopped and the diarrhea has returned. She state that she has already had 5 BMs today.  She is doing her best to stay well hydrated and has maintained [name] good appetite. Her weight is stable at 199 lbs.  M-spike in April was 1.3 g/dL, IgG level was 2,361 mg/dL and kappa light chains 6.83 mg/dL. 24 hour urine showed myeloma proteins to be stable.   No fever, chills, n/v, cough, rash, dizziness, SOB, chest pain, palpitations, abdominal pain or changes in bowel or bladder habits.  No abnormal blood loss, bruising or petechiae.   She also has history of heart failure, cardiomyopathy with ICD in place Patient notes that she had her ICD replaced in March of this year Of note she did take [name] course of amoxicillin in May due to UTI symptoms- but she tested negative for C diff on 5/19 Patient also states she has been taking amoxicillin somewhat off and on since she was given the prescription earlier this month.  She states she took most of it, then has been taking [name] pill here and there the last several days.  She feels like when she  takes [name] pill amoxicillin she temporarily feels better  Currently pt notes continued diarrhea- this started about one month ago-apparently prior to treatment for UTI She may have [name] stool up to 4-5 per day She notes loose but not watery stools She is using pepto as needed; she also has lomotil and cholestyramine- she is still using these but feels that they do not help No vomiting but some nausea She is still drinking liquids but not much appetite- she is trying to hydrate well She continues to note some urinary urgency No blood in her stool- she noted some blood when she wiped but this went away, she thought due to irritation of the skin  She is status post hysterectomy and cholecystectomy Her GI is Tami [name]- she also has seen Tami [name] quite [name] bit in the past and would be glad to follow-up with him  Patient Active Problem List   Diagnosis Date Noted   Dehydration 04/03/2022   Acute vaginitis 03/18/2022   Cervical cancer screening 03/18/2022   Dizziness 12/11/2021   Visual changes 11/26/2021   Ear fullness, bilateral 08/12/2021   Bilateral knee pain 08/08/2021   History of 2019 novel coronavirus disease (COVID-19) 12/06/2020   Low vitamin D level 07/31/2020   Acute combined systolic and diastolic heart failure (HCC) 05/09/2020   Low magnesium   level 03/04/2020   Thrombocytopenia (HCC) 03/04/2020   Dysphagia 03/04/2020   Constipation 03/04/2020   Sensation of pressure in bladder area 01/17/2020   Hyperglycemia 01/17/2020   Peripheral neuropathy 11/15/2019   Chronic migraine without aura, with intractable migraine, so stated, with status migrainosus 10/17/2019   ETD (Eustachian tube dysfunction), bilateral 09/12/2019   Urinary frequency 07/17/2019   Headache 07/17/2019   Palpitation 05/15/2019   Diarrhea 09/04/2018   Thyroid nodule 08/26/2018   Multiple myeloma (HCC) 07/20/2018   Multiple myeloma not having achieved remission (HCC) 07/20/2018   Hematuria 07/11/2018   Seasonal  allergies 06/23/2018   Right shoulder pain 06/07/2018   Neck pain 06/07/2018   Paresthesias 06/07/2018   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   Pain in thoracic spine 03/18/2018   Thoracic back pain 02/15/2018   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   Hypokalemia 08/20/2017   Anemia 08/20/2017   Elevated sed rate 08/15/2017   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   Epigastric pain 03/22/2017   Myalgia 12/31/2016   SOB (shortness of breath) 07/26/2016   Abnormal SPEP 07/26/2016   Low back pain 07/14/2016   Sore throat 04/19/2016   Insomnia 12/29/2015   Tension headache 10/21/2015   Muscle spasms of neck 10/18/2015   Dysuria 10/13/2015   Sinusitis 07/07/2015   AP (abdominal pain) 07/07/2015   Cardiomyopathy (HCC) 04/19/2015   Chest wall pain 04/19/2015   Obstructive sleep apnea 03/31/2015   Congestive heart failure (HCC) 02/24/2015   Arthritis    Hyperlipidemia    Hypertension    Depression    Stroke (HCC)    GERD (gastroesophageal reflux disease)    Bowel obstruction (HCC) 11/09/2014    Past Medical History:  Diagnosis Date   Abnormal SPEP 07/26/2016   Arthritis    knees, hands   Back pain 07/14/2016   Bowel obstruction (HCC) 11/2014   C. 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   Multiple myeloma (HCC) 07/20/2018   Multiple myeloma not having achieved remission (HCC) 07/20/2018   Myalgia 12/31/2016   Obstructive sleep apnea 03/31/2015   SOB (shortness of breath) 04/09/2016   Stroke (HCC)    TIAs   UTI (urinary tract infection)     Past Surgical History:   Procedure Laterality Date   ABDOMINAL HYSTERECTOMY     menorraghia, 2006, total   CHOLECYSTECTOMY     COLONOSCOPY  2018   [facility] per patient   ESOPHAGOGASTRODUODENOSCOPY  2018   [facility]    KNEE SURGERY     right, repair torn torn cartialage   PACEMAKER INSERTION  08/2014   TONSILLECTOMY     TUBAL LIGATION      Social History   Tobacco Use   Smoking status: Never   Smokeless tobacco: Never  Vaping Use   Vaping Use: Never used  Substance Use Topics   Alcohol use: No   Drug use: No    Family History  Problem Relation Age of Onset   Diabetes Mother    Hypertension Mother    Heart disease Mother          s/p 1 stent   Hyperlipidemia Mother    Arthritis Mother    Cancer Father        COLON   Colon cancer Father 76   Irritable bowel syndrome Sister    Hyperlipidemia Daughter    Hypertension Daughter    Hypertension Maternal Grandmother    Arthritis Maternal Grandmother    Heart disease Maternal Grandfather        MI   Hypertension Maternal Grandfather    Arthritis Maternal Grandfather    Hypertension Son    Esophageal cancer Neg Hx     Allergies  Allergen Reactions   Benazepril Anaphylaxis, Swelling and Hives    angioedema Throat and lip swelling   Ondansetron Hcl Hives    Redness and hives post IV admin on 07/05/17   Codeine Nausea And Vomiting   Morphine Hives    Redness and hives noted post IV admin on 07/05/17    Medication list has been reviewed and updated.  Current Outpatient Medications on File Prior to Visit  Medication Sig Dispense Refill   acetaminophen (TYLENOL) 500 MG tablet Take 500 mg by mouth every 6 (six) hours as needed.     acyclovir (ZOVIRAX) 400 MG tablet TAKE ONE (1) TABLET BY MOUTH TWO (2) TIMES DAILY 60 tablet 1   albuterol (VENTOLIN HFA) 108 (90 Base) MCG/ACT inhaler Inhale 2 puffs into the lungs every 6 (six) hours as needed for wheezing or shortness of breath. 18 g 5   aspirin EC 81 MG tablet Take  1 tablet (81 mg total) by mouth daily. 90 tablet 1   budesonide-formoterol (SYMBICORT) 160-4.5 MCG/ACT inhaler Inhale 2 puffs into the lungs as needed. 1 each 3   carvedilol (COREG) 25 MG tablet Take 1 tablet (25 mg total) by mouth 2 (two) times daily with [name] meal. 180 tablet 1   celecoxib (CELEBREX) 100 MG capsule Take 1 capsule (100 mg total) by mouth 2 (two) times daily as needed for mild pain or moderate pain (with food). 60 capsule 3   Cholecalciferol (VITAMIN D-3) 25 MCG (1000 UT) CAPS Take 1 capsule (1,000 Units total) by mouth daily. 60 capsule 2   cholestyramine (QUESTRAN) 4 g packet Take 1 packet (4 g total) by mouth 2 (two) times daily. 60 each 3   cyclobenzaprine (FLEXERIL) 10 MG tablet Take 1 tablet (10 mg total) by mouth at bedtime. (Patient taking differently: Take 10 mg by mouth as needed.) 7 tablet 0   dicyclomine (BENTYL) 10 MG capsule Take 1 tab every 6 hours as needed for abdominal cramping. 60 capsule 0   diphenhydrAMINE (BENADRYL) 25 mg capsule TAKE ONE (1) CAPSULE BY MOUTH THREE (3) TIMES EACH DAY AS NEEDED     diphenoxylate-atropine (LOMOTIL) 2.5-0.025 MG tablet TAKE ONE (1) TABLET BY MOUTH FOUR (4) TIMES DAILY AS NEEDED FOR DIARRHEA OR LOOSE STOOLS 40 tablet 0   erythromycin ophthalmic ointment 1 application QID.     Ferrous Fumarate-Folic Acid 324-1 MG TABS Take 324 mg by mouth daily. 90 tablet 1   fluticasone (FLONASE) 50 MCG/ACT nasal spray Place 2 sprays into both nostrils daily. (Patient taking differently: Place 2 sprays into both nostrils as needed.) 16 g 0   fluticasone furoate-vilanterol (BREO ELLIPTA) 100-25 MCG/ACT AEPB Inhale 1 puff into the lungs daily. (Patient taking differently: Inhale 1 puff into the lungs as needed.) 1 each 11   furosemide (LASIX) 20 MG tablet Take 1 tablet by mouth as needed.     HYDROcodone-acetaminophen (NORCO/VICODIN) 5-325 MG   tablet Take 1 tablet by mouth every 6 (six) hours as needed. 6 tablet 0   hydrOXYzine (ATARAX) 10 MG tablet TAKE  ONE (1) TABLET BY MOUTH 3 TIMES DAILY AS NEEDED FOR ITCHING 30 tablet 0   hydrOXYzine (ATARAX/VISTARIL) 25 MG tablet Take 25 mg by mouth as needed.     ibuprofen (ADVIL) 800 MG tablet Take 1 tablet (800 mg total) by mouth 3 (three) times daily. (Patient taking differently: Take 800 mg by mouth as needed.) 21 tablet 0   loperamide (IMODIUM) 2 MG capsule Take 2 mg by mouth as needed.     loratadine (CLARITIN) 10 MG tablet Take 1 tablet (10 mg total) by mouth daily. 30 tablet 11   losartan (COZAAR) 50 MG tablet Take 50 mg by mouth daily.     magnesium oxide (MAG-OX) 400 MG tablet Take 1 tablet (400 mg total) by mouth 2 (two) times daily. 60 tablet 1   meclizine (ANTIVERT) 12.5 MG tablet Take 12.5 mg by mouth as needed.     metoCLOPramide (REGLAN) 10 MG tablet Take 10 mg by mouth as needed.     montelukast (SINGULAIR) 10 MG tablet Take 1 tablet (10 mg total) by mouth at bedtime. (Patient taking differently: Take 10 mg by mouth as needed.) 30 tablet 3   Multiple Vitamins-Minerals (MULTIVITAMIN ADULT PO) Take 1 tablet by mouth daily.     omeprazole (PRILOSEC) 40 MG capsule Take 1 capsule (40 mg total) by mouth in the morning and at bedtime. 60 capsule 2   promethazine (PHENERGAN) 25 MG tablet Take 1 tablet (25 mg total) by mouth every 8 (eight) hours as needed for nausea or vomiting (headache). 30 tablet 1   sucralfate (CARAFATE) 1 g tablet Take 1 tablet (1 g total) by mouth 3 (three) times daily as needed. prn 90 tablet 1   tiZANidine (ZANAFLEX) 4 MG tablet Take 1 tablet (4 mg total) by mouth every 6 (six) hours as needed for muscle spasms. 30 tablet 0   topiramate (TOPAMAX) 50 MG tablet Take 1 tablet (50 mg total) by mouth 2 (two) times daily. 60 tablet 3   triamcinolone cream (KENALOG) 0.1 % Apply 1 application topically 2 (two) times daily. 60 g 0   zinc sulfate 220 (50 Zn) MG capsule Take 220 mg by mouth 2 (two) times daily.     [DISCONTINUED] prochlorperazine (COMPAZINE) 10 MG tablet Take 1 tablet  (10 mg total) by mouth every 6 (six) hours as needed (Nausea or vomiting). 30 tablet 1   No current facility-administered medications on file prior to visit.    Review of Systems:  As per HPI- otherwise negative.   Physical Examination: Vitals:   04/09/22 1308  BP: 132/88  Pulse: 96  Resp: 18  Temp: 97.6 F (36.4 C)  SpO2: 96%   Vitals:   04/09/22 1308  Weight: 199 lb 6.4 oz (90.4 kg)  Height: 5' 5" (1.651 m)   Body mass index is 33.18 kg/m. Ideal Body Weight: Weight in (lb) to have BMI = 25: 149.9  GEN: no acute distress.  Obese, looks well  HEENT: Atraumatic, Normocephalic.  Ears and Nose: No external deformity. CV: RRR, No M/G/R. No JVD. No thrill. No extra heart sounds. PULM: CTA B, no wheezes, crackles, rhonchi. No retractions. No resp. distress. No accessory muscle use. ABD: S, ND, +BS. No rebound. No HSM.  Patient has mild and vague tenderness over the entire abdomen EXTR: No c/c/e PSYCH: Normally interactive. Conversant.  Pelvic exam performed.    No source of bleeding is noted.  Patient does have external hemorrhoids.  Vaginal canal is normal, no bleeding or lesions.  Status post hysterectomy  Assessment and Plan: Abdominal discomfort - Plan: CT Abdomen Pelvis W Contrast, CANCELED: CT Abdomen Pelvis W Contrast  Urinary frequency - Plan: Urine Culture, CANCELED: Urine Culture  Diarrhea, unspecified type - Plan: CT Abdomen Pelvis W Contrast, Stool Culture, CANCELED: Stool Culture, CANCELED: CT Abdomen Pelvis W Contrast  Patient seen today with concern of abdominal discomfort and diarrhea.  The history is somewhat difficult to tease out, but it seems she has had diarrhea for about [name] month, this was going on prior to treatment for UTI.  She did use amoxicillin for UTI and has continued to take some of the pills sporadically the last few days.  I offered to retest her for C. difficile but she declines at this time We ordered [name] urine culture- the patient was unable to  urinate after 2 hours in clinic.  She is given [name] sample cup and asked to bring [place] [name] sample ASAP We also ordered [name] stool culture-non-C. difficile test She just had lab work last week and hematology so no further labs are required for CT scan.  I ordered [name] stat CT-received report later the patient scheduled this for Tuesday of next week She has been asked to let us know if any changes or worsening of her symptoms   Signed Lamar Blinks, MD  Update 6/6- CT scan as below showed some air in the bladder  CT Abdomen Pelvis W Contrast  Result Date: 04/13/2022 CLINICAL DATA:  Acute abdomen pain with diarrhea for several weeks. EXAM: CT ABDOMEN AND PELVIS WITH CONTRAST TECHNIQUE: Multidetector CT imaging of the abdomen and pelvis was performed using the standard protocol following bolus administration of intravenous contrast. RADIATION DOSE REDUCTION: This exam was performed according to the departmental dose-optimization program which includes automated exposure control, adjustment of the mA and/or kV according to patient size and/or use of iterative reconstruction technique. CONTRAST:  156m OMNIPAQUE IOHEXOL 300 MG/ML  SOLN COMPARISON:  October 16, 2021 FINDINGS: Lower chest: Minimal atelectasis of left lung base is identified. The heart size is enlarged. Hepatobiliary: Stable small liver cysts are identified in the right lobe liver measuring up to 6 mm. The gallbladder is surgically absent. Common bile ducts measures maximum 1 cm, postsurgical. Pancreas: Unremarkable. No pancreatic ductal dilatation or surrounding inflammatory changes. Spleen: Normal in size without focal abnormality. Adrenals/Urinary Tract: Bilateral adrenal glands are normal. There is no hydronephrosis bilaterally. Stable simple cysts are identified in bilateral kidneys, largest in the midpole right kidney measuring 1.8 cm unchanged compared prior exam. No follow-up is necessary. The bladder is partially decompressed with air within the  bladder lumen. Correlation with recent instrumentation is suggested. Stomach/Bowel: Stomach is within normal limits. Appendix appears normal. No evidence of bowel wall thickening, distention, or inflammatory changes. Vascular/Lymphatic: No significant vascular findings are present. No enlarged abdominal or pelvic lymph nodes. Reproductive: Status post hysterectomy. No adnexal masses. Other: Gluteus injection granulomas are identified unchanged. Musculoskeletal: Degenerative joint changes of the spine are noted. IMPRESSION: 1. No acute abnormality identified in the abdomen and pelvis. 2. Air within the bladder lumen. Correlation with recent instrumentation is suggested. Electronically Signed   By: WAbelardo DieselM.D.   On: 04/13/2022 07:59    I spoke with on call urologist- small amount of air likely due to mild bactiuria or perhaps her surgical procedure 2 months ago, does not appear concerning  for fistula.  I have pt on cipro at this time for her diarrhea. Otherwise nothing needed at ths time Called to give her this update re: her CT report.  Left detailed message on her VM  Called 6/9- she states she is feeling better, she will let us know if sx should return

## 2022-04-10 ENCOUNTER — Telehealth: Payer: Self-pay | Admitting: Family Medicine

## 2022-04-10 ENCOUNTER — Other Ambulatory Visit (INDEPENDENT_AMBULATORY_CARE_PROVIDER_SITE_OTHER): Payer: Medicare Other

## 2022-04-10 DIAGNOSIS — R350 Frequency of micturition: Secondary | ICD-10-CM | POA: Diagnosis not present

## 2022-04-10 DIAGNOSIS — R197 Diarrhea, unspecified: Secondary | ICD-10-CM | POA: Diagnosis not present

## 2022-04-10 NOTE — Telephone Encounter (Signed)
Called pt- she scheduled her CT for Tuesday because they had an AM spot.  I wanted to make sure she is ok until then, pt agrees to alert [place] if any change or worsening in the meantime

## 2022-04-11 LAB — URINE CULTURE
MICRO NUMBER:: 13476308
SPECIMEN QUALITY:: ADEQUATE

## 2022-04-13 ENCOUNTER — Encounter (HOSPITAL_BASED_OUTPATIENT_CLINIC_OR_DEPARTMENT_OTHER): Payer: Self-pay

## 2022-04-13 ENCOUNTER — Ambulatory Visit (HOSPITAL_BASED_OUTPATIENT_CLINIC_OR_DEPARTMENT_OTHER)
Admission: RE | Admit: 2022-04-13 | Discharge: 2022-04-13 | Disposition: A | Discharge status: Home / Self Care | Payer: Medicare Other | Source: Ambulatory Visit | Attending: Family Medicine | Admitting: Family Medicine

## 2022-04-13 DIAGNOSIS — R197 Diarrhea, unspecified: Secondary | ICD-10-CM | POA: Insufficient documentation

## 2022-04-13 DIAGNOSIS — R109 Unspecified abdominal pain: Secondary | ICD-10-CM | POA: Diagnosis not present

## 2022-04-13 MED ORDER — CIPROFLOXACIN HCL 500 MG PO TABS: 500.0000 mg | ORAL_TABLET | Freq: Two times a day (BID) | ORAL | 0 refills | Status: AC

## 2022-04-13 MED ORDER — IOHEXOL 300 MG/ML  SOLN
100.0000 mL | Freq: Once | INTRAMUSCULAR | Status: AC | PRN
  Administered 2022-04-13: 100 mL via INTRAVENOUS

## 2022-04-13 NOTE — Addendum Note (Signed)
Addended by: Lamar Blinks C on: 04/13/2022 06:19 PM   Modules accepted: Orders

## 2022-04-13 NOTE — Progress Notes (Signed)
6/5, received patient stool studies and CT report  Results for orders placed or performed in visit on 04/10/22  Stool Culture   Specimen: Stool   Stool  Result Value Ref Range   Salmonella/Shigella Screen Final report    Stool Culture result 1 ([facility]AS[facility]) Comment    Campylobacter Culture Preliminary report    Stool Culture result 1 (CMPCXR) Comment    E coli, Shiga toxin Assay Negative Negative  Urine Culture   Specimen: Urine  Result Value Ref Range   MICRO NUMBER: [id number]    SPECIMEN QUALITY: Adequate    Sample Source URINE    STATUS: FINAL    Result:      Mixed genital flora isolated. These superficial bacteria are not indicative of a urinary tract infection. No further organism identification is warranted on this specimen. If clinically indicated, recollect clean-catch, mid-stream urine and transfer  immediately to Urine Culture Transport Tube.    *Note: Due to a large number of results and/or encounters for the requested time period, some results have not been displayed. A complete set of results can be found in Results Review.    CT Abdomen Pelvis W Contrast  Result Date: 04/13/2022 CLINICAL DATA:  Acute abdomen pain with diarrhea for several weeks. EXAM: CT ABDOMEN AND PELVIS WITH CONTRAST TECHNIQUE: Multidetector CT imaging of the abdomen and pelvis was performed using the standard protocol following bolus administration of intravenous contrast. RADIATION DOSE REDUCTION: This exam was performed according to the departmental dose-optimization program which includes automated exposure control, adjustment of the mA and/or kV according to patient size and/or use of iterative reconstruction technique. CONTRAST:  162m OMNIPAQUE IOHEXOL 300 MG/ML  SOLN COMPARISON:  October 16, 2021 FINDINGS: Lower chest: Minimal atelectasis of left lung base is identified. The heart size is enlarged. Hepatobiliary: Stable small liver cysts are identified in the right lobe liver measuring up to 6 mm. The  gallbladder is surgically absent. Common bile ducts measures maximum 1 cm, postsurgical. Pancreas: Unremarkable. No pancreatic ductal dilatation or surrounding inflammatory changes. Spleen: Normal in size without focal abnormality. Adrenals/Urinary Tract: Bilateral adrenal glands are normal. There is no hydronephrosis bilaterally. Stable simple cysts are identified in bilateral kidneys, largest in the midpole right kidney measuring 1.8 cm unchanged compared prior exam. No follow-up is necessary. The bladder is partially decompressed with air within the bladder lumen. Correlation with recent instrumentation is suggested. Stomach/Bowel: Stomach is within normal limits. Appendix appears normal. No evidence of bowel wall thickening, distention, or inflammatory changes. Vascular/Lymphatic: No significant vascular findings are present. No enlarged abdominal or pelvic lymph nodes. Reproductive: Status post hysterectomy. No adnexal masses. Other: Gluteus injection granulomas are identified unchanged. Musculoskeletal: Degenerative joint changes of the spine are noted. IMPRESSION: 1. No acute abnormality identified in the abdomen and pelvis. 2. Air within the bladder lumen. Correlation with recent instrumentation is suggested. Electronically Signed   By: WAbelardo DieselM.D.   On: 04/13/2022 07:59    Pt notes that back in March she did have a catheter in for several hours during her pacemaker surgery, otherwise no recent bladder procedure She is having 4 stools per day currently Her condition is basically unchanged We decided to try 3 days of Cipro for possible infectious diarrhea As her symptoms are subacute, I will call urology tomorrow to get their opinion about air in her bladder

## 2022-04-14 ENCOUNTER — Ambulatory Visit (HOSPITAL_BASED_OUTPATIENT_CLINIC_OR_DEPARTMENT_OTHER): Payer: Medicare Other

## 2022-04-14 LAB — STOOL CULTURE: E coli, Shiga toxin Assay: NEGATIVE

## 2022-04-20 NOTE — Progress Notes (Signed)
Subjective:    [name], female    DOB: 04-Aug-1956, 66 y.o.   MRN: [id number]  Chief Complaint  Patient presents with   Diarrhea   Fatigue    HPI Patient is in today for diarrhea and follow up on chronic medical concerns.  No recent obvious febrile illness or hospitalizations.  She continues to struggle with roughly 4 loose stool a day.  May be linked to certain foods but she is unsure.  She has just started Lomotil back but has not noticed a significant difference yet.  Recent C. difficile testing was negative and she does not know the smell she usually associates with C. difficile.  She is noting fatigue but denies palpitations or muscle cramping.  She has tolerated the placement of her pacer well and feels overall she has felt some better other than the fatigue and exhaustion that comes with the diarrhea. Denies CP/palp/SOB/HA/congestion/fevers or GU c/o. Taking meds as prescribed   Past Medical History:  Diagnosis Date   Abnormal SPEP 07/26/2016   Arthritis    knees, hands   Back pain 07/14/2016   Bowel obstruction ([place]) 11/2014   C. 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   Multiple myeloma ([place]) 07/20/2018   Multiple myeloma not having achieved remission ([place]) 07/20/2018   Myalgia 12/31/2016   Obstructive sleep apnea 03/31/2015   SOB (shortness of breath) 04/09/2016   Stroke ([place])    TIAs   UTI (urinary tract infection)     Past Surgical History:  Procedure Laterality Date   ABDOMINAL HYSTERECTOMY     menorraghia, 2006, total   CHOLECYSTECTOMY     COLONOSCOPY  2018    [facility] per patient   ESOPHAGOGASTRODUODENOSCOPY  2018   [facility]    KNEE SURGERY     right, repair torn torn cartialage   PACEMAKER INSERTION  08/2014   TONSILLECTOMY     TUBAL LIGATION      Family History  Problem Relation Age of Onset   Diabetes Mother    Hypertension Mother    Heart disease Mother        s/p 1 stent   Hyperlipidemia Mother    Arthritis Mother    Cancer Father        COLON   Colon cancer Father 56   Irritable bowel syndrome Sister    Hyperlipidemia Daughter    Hypertension Daughter    Hypertension Maternal Grandmother    Arthritis Maternal Grandmother    Heart disease Maternal Grandfather        MI   Hypertension Maternal Grandfather    Arthritis Maternal Grandfather    Hypertension Son    Esophageal cancer Neg Hx     Social History   Socioeconomic History   Marital status: Divorced    Spouse name: [name]   Number of children: 3   Years of education: [name]   Highest education level: High school graduate  Occupational History   [name]  Tobacco Use   Smoking status: Never  Smokeless tobacco: Never  Vaping Use   Vaping Use: Never used  Substance and Sexual Activity   Alcohol use: No   Drug use: No   Sexual activity: [name]  Other Topics Concern   [name]  Social History Narrative   Lives at home alone   Retired   Caffeine: coffee   Social Determinants of Health   Financial Resource Strain: Low Risk  (10/17/2021)   Overall Financial Resource Strain (CARDIA)    Difficulty of Paying Living Expenses: Not hard at all  Food Insecurity: No Food Insecurity (10/17/2021)   Hunger Vital Sign    Worried About Running Out of Food in the Last Year: Never true    Ran Out of Food in the Last Year: Never true  Transportation Needs: No Transportation Needs (10/17/2021)   PRAPARE - Hydrologist (Medical): No    Lack of Transportation (Non-Medical): No  Physical  Activity: Insufficiently Active (10/17/2021)   Exercise Vital Sign    Days of Exercise per Week: 2 days    Minutes of Exercise per Session: 30 min  Stress: No Stress Concern Present (10/17/2021)   [place]    Feeling of Stress : Not at all  Social Connections: Moderately Isolated (10/17/2021)   Social Connection and Isolation Panel [NHANES]    Frequency of Communication with Friends and Family: More than three times a week    Frequency of Social Gatherings with Friends and Family: More than three times a week    Attends Religious Services: More than 4 times per year    Active Member of [employer] or Organizations: No    Attends Archivist Meetings: Never    Marital Status: Divorced  Human resources officer Violence: Not At Risk (10/17/2021)   Humiliation, Afraid, Rape, and Kick questionnaire    Fear of Current or Ex-Partner: No    Emotionally Abused: No    Physically Abused: No    Sexually Abused: No    Outpatient Medications Prior to Visit  Medication Sig Dispense Refill   acetaminophen (TYLENOL) 500 MG tablet Take 500 mg by mouth every 6 (six) hours as needed.     acyclovir (ZOVIRAX) 400 MG tablet TAKE ONE (1) TABLET BY MOUTH TWO (2) TIMES DAILY 60 tablet 1   albuterol (VENTOLIN HFA) 108 (90 Base) MCG/ACT inhaler Inhale 2 puffs into the lungs every 6 (six) hours as needed for wheezing or shortness of breath. 18 g 5   aspirin EC 81 MG tablet Take 1 tablet (81 mg total) by mouth daily. 90 tablet 1   budesonide-formoterol (SYMBICORT) 160-4.5 MCG/ACT inhaler Inhale 2 puffs into the lungs as needed. 1 each 3   carvedilol (COREG) 25 MG tablet Take 1 tablet (25 mg total) by mouth 2 (two) times daily with a meal. 180 tablet 1   celecoxib (CELEBREX) 100 MG capsule Take 1 capsule (100 mg total) by mouth 2 (two) times daily as needed for mild pain or moderate pain (with food). 60 capsule 3   cephALEXin (KEFLEX) 500 MG capsule Take  500 mg by mouth 3 (three) times daily.     Cholecalciferol (VITAMIN D-3) 25 MCG (1000 UT) CAPS Take 1 capsule (1,000 Units total) by mouth daily. 60 capsule 2   cholestyramine (QUESTRAN) 4 g packet Take 1 packet (4 g total) by mouth 2 (two) times daily. 60 each 3   cyclobenzaprine (FLEXERIL) 10 MG tablet Take 1 tablet (  10 mg total) by mouth at bedtime. (Patient taking differently: Take 10 mg by mouth as needed.) 7 tablet 0   dicyclomine (BENTYL) 10 MG capsule Take 1 tab every 6 hours as needed for abdominal cramping. 60 capsule 0   diphenhydrAMINE (BENADRYL) 25 mg capsule TAKE ONE (1) CAPSULE BY MOUTH THREE (3) TIMES EACH DAY AS NEEDED     diphenoxylate-atropine (LOMOTIL) 2.5-0.025 MG tablet TAKE ONE (1) TABLET BY MOUTH FOUR (4) TIMES DAILY AS NEEDED FOR DIARRHEA OR LOOSE STOOLS 40 tablet 0   erythromycin ophthalmic ointment 1 application QID.     Ferrous Fumarate-Folic Acid 170-0 MG TABS Take 324 mg by mouth daily. 90 tablet 1   fluticasone (FLONASE) 50 MCG/ACT nasal spray Place 2 sprays into both nostrils daily. (Patient taking differently: Place 2 sprays into both nostrils as needed.) 16 g 0   fluticasone furoate-vilanterol (BREO ELLIPTA) 100-25 MCG/ACT AEPB Inhale 1 puff into the lungs daily. (Patient taking differently: Inhale 1 puff into the lungs as needed.) 1 each 11   furosemide (LASIX) 20 MG tablet Take 1 tablet by mouth as needed.     HYDROcodone-acetaminophen (NORCO/VICODIN) 5-325 MG tablet Take 1 tablet by mouth every 6 (six) hours as needed. 6 tablet 0   hydrOXYzine (ATARAX) 10 MG tablet TAKE ONE (1) TABLET BY MOUTH 3 TIMES DAILY AS NEEDED FOR ITCHING 30 tablet 0   hydrOXYzine (ATARAX/VISTARIL) 25 MG tablet Take 25 mg by mouth as needed.     ibuprofen (ADVIL) 800 MG tablet Take 1 tablet (800 mg total) by mouth 3 (three) times daily. (Patient taking differently: Take 800 mg by mouth as needed.) 21 tablet 0   loperamide (IMODIUM) 2 MG capsule Take 2 mg by mouth as needed.     loratadine  (CLARITIN) 10 MG tablet Take 1 tablet (10 mg total) by mouth daily. 30 tablet 11   losartan (COZAAR) 50 MG tablet Take 50 mg by mouth daily.     magnesium oxide (MAG-OX) 400 MG tablet Take 1 tablet (400 mg total) by mouth 2 (two) times daily. 60 tablet 1   meclizine (ANTIVERT) 12.5 MG tablet Take 12.5 mg by mouth as needed.     metoCLOPramide (REGLAN) 10 MG tablet Take 10 mg by mouth as needed.     montelukast (SINGULAIR) 10 MG tablet Take 1 tablet (10 mg total) by mouth at bedtime. (Patient taking differently: Take 10 mg by mouth as needed.) 30 tablet 3   Multiple Vitamins-Minerals (MULTIVITAMIN ADULT PO) Take 1 tablet by mouth daily.     omeprazole (PRILOSEC) 40 MG capsule Take 1 capsule (40 mg total) by mouth in the morning and at bedtime. 60 capsule 2   promethazine (PHENERGAN) 25 MG tablet Take 1 tablet (25 mg total) by mouth every 8 (eight) hours as needed for nausea or vomiting (headache). 30 tablet 1   sucralfate (CARAFATE) 1 g tablet Take 1 tablet (1 g total) by mouth 3 (three) times daily as needed. prn 90 tablet 1   topiramate (TOPAMAX) 50 MG tablet Take 1 tablet (50 mg total) by mouth 2 (two) times daily. 60 tablet 3   triamcinolone cream (KENALOG) 0.1 % Apply 1 application topically 2 (two) times daily. 60 g 0   zinc sulfate 220 (50 Zn) MG capsule Take 220 mg by mouth 2 (two) times daily.     No facility-administered medications prior to visit.    Allergies  Allergen Reactions   Benazepril Anaphylaxis, Swelling and Hives    angioedema Throat  and lip swelling   Ondansetron Hcl Hives    Redness and hives post IV admin on 07/05/17   Codeine Nausea And Vomiting   Morphine Hives    Redness and hives noted post IV admin on 07/05/17    Review of Systems  Constitutional:  Positive for malaise/fatigue. Negative for fever.  HENT:  Negative for congestion.   Eyes:  Negative for blurred vision.  Respiratory:  Negative for shortness of breath.   Cardiovascular:  Negative for chest  pain, palpitations and leg swelling.  Gastrointestinal:  Positive for blood in stool and diarrhea. Negative for abdominal pain, constipation, heartburn, melena, nausea and vomiting.  Genitourinary:  Negative for dysuria and frequency.  Musculoskeletal:  Negative for falls.  Skin:  Negative for rash.  Neurological:  Negative for dizziness, loss of consciousness and headaches.  Endo/Heme/Allergies:  Negative for environmental allergies.  Psychiatric/Behavioral:  Negative for depression. The patient is nervous/anxious.        Objective:    Physical Exam Constitutional:      General: She is not in acute distress.    Appearance: She is well-developed.  HENT:     Head: Normocephalic and atraumatic.  Eyes:     Conjunctiva/sclera: Conjunctivae normal.  Neck:     Thyroid: No thyromegaly.  Cardiovascular:     Rate and Rhythm: Normal rate and regular rhythm.     Heart sounds: Normal heart sounds. No murmur heard.    Comments: Left upper anterior CW with SQ pacer and Right upper anterior CW with port. Both well healed and no erythema or fluctuance Pulmonary:     Effort: Pulmonary effort is normal. No respiratory distress.     Breath sounds: Normal breath sounds.  Abdominal:     General: Bowel sounds are normal. There is no distension.     Palpations: Abdomen is soft. There is no mass.     Tenderness: There is no abdominal tenderness.  Musculoskeletal:     Cervical back: Neck supple.  Lymphadenopathy:     Cervical: No cervical adenopathy.  Skin:    General: Skin is warm and dry.  Neurological:     Mental Status: She is alert and oriented to person, place, and time.  Psychiatric:        Behavior: Behavior normal.     BP 122/72 (BP Location: Left Arm, Patient Position: Sitting, Cuff Size: Normal)   Pulse 85   Resp 20   Ht $R'5\' 5"'WC$  (1.651 m)   Wt 200 lb (90.7 kg)   SpO2 97%   BMI 33.28 kg/m  Wt Readings from Last 3 Encounters:  04/21/22 200 lb (90.7 kg)  04/09/22 199 lb 6.4 oz  (90.4 kg)  04/03/22 199 lb 1.9 oz (90.3 kg)    Diabetic Foot Exam - Simple   No data filed    Lab Results  Component Value Date   WBC 2.6 (L) 04/03/2022   HGB 12.0 04/03/2022   HCT 38.0 04/03/2022   PLT 118 (L) 04/03/2022   GLUCOSE 93 04/03/2022   CHOL 184 09/16/2021   TRIG 163.0 (H) 09/16/2021   HDL 42.00 09/16/2021   LDLCALC 109 (H) 09/16/2021   ALT 14 04/03/2022   AST 25 04/03/2022   NA 139 04/03/2022   K 4.0 04/03/2022   CL 106 04/03/2022   CREATININE 0.67 04/03/2022   BUN 18 04/03/2022   CO2 26 04/03/2022   TSH 1.51 09/16/2021   INR 0.90 07/28/2018   HGBA1C 6.0 09/16/2021    Lab Results  Component Value Date   TSH 1.51 09/16/2021   Lab Results  Component Value Date   WBC 2.6 (L) 04/03/2022   HGB 12.0 04/03/2022   HCT 38.0 04/03/2022   MCV 90.5 04/03/2022   PLT 118 (L) 04/03/2022   Lab Results  Component Value Date   NA 139 04/03/2022   K 4.0 04/03/2022   CHLORIDE 108 12/31/2016   CO2 26 04/03/2022   GLUCOSE 93 04/03/2022   BUN 18 04/03/2022   CREATININE 0.67 04/03/2022   BILITOT 0.3 04/03/2022   ALKPHOS 52 04/03/2022   AST 25 04/03/2022   ALT 14 04/03/2022   PROT 8.8 (H) 04/03/2022   ALBUMIN 3.8 04/03/2022   CALCIUM 9.7 04/03/2022   ANIONGAP 7 04/03/2022   EGFR >90 12/31/2016   GFR 92.09 12/05/2021   Lab Results  Component Value Date   CHOL 184 09/16/2021   Lab Results  Component Value Date   HDL 42.00 09/16/2021   Lab Results  Component Value Date   LDLCALC 109 (H) 09/16/2021   Lab Results  Component Value Date   TRIG 163.0 (H) 09/16/2021   Lab Results  Component Value Date   CHOLHDL 4 09/16/2021   Lab Results  Component Value Date   HGBA1C 6.0 09/16/2021       Assessment & Plan:      Problem List Items Addressed This Visit     Multiple myeloma not having achieved remission ([place]) (Chronic)    Following with oncology and stable. Has needed fluids recently for dehydration      Relevant Medications   cephALEXin  (KEFLEX) 500 MG capsule   Hyperlipidemia    Encourage heart healthy diet such as MIND or DASH diet, increase exercise, avoid trans fats, simple carbohydrates and processed foods, consider a krill or fish or flaxseed oil cap daily.       Relevant Orders   Lipid panel   Hypertension    Well controlled, no changes to meds. Encouraged heart healthy diet such as the DASH diet and exercise as tolerated.       Relevant Orders   CBC   Comprehensive metabolic panel   Magnesium   Elevated sed rate    Repeat se rate today      Diarrhea - Primary    Still having roughly 4 loose stool daily. Cdiff negative and she confirms it does not have that smell. discussed strategies and will check labs today. Has just restarted Lomotil prn. No artifical sweeteners such as Sucralose, Aspartame, Erythritol, Xylitol, Stevia etc because they can contribute to diarrhea.  24-48 hours of clear liquids then 24-48 hours of simple carbohydrates.   Benefiber or Metamucil 2-3 x a day  60-80 ounces of clear fluids      Relevant Orders   CBC   Comprehensive metabolic panel   TSH   Magnesium   Sedimentation rate   Hyperglycemia    hgba1c acceptable, minimize simple carbs. Increase exercise as tolerated.       Relevant Orders   Hemoglobin A1c   Low magnesium level    Has had her supplementation dropped some to see if that helps her diarrhea but so far has not helped. Recheck levels today and consider dropping further      Thrombocytopenia (HCC)    Check CBC today. She is noting a slight increase in bruising most notably in left thigh. No obvious trauma. No bleeding gums and her hemorrhoidal bleeding is not worsening      Low vitamin D  level    Supplement and monitor       I am having Mardene Celeste A. Shoun maintain her aspirin EC, Multiple Vitamins-Minerals (MULTIVITAMIN ADULT PO), topiramate, fluticasone, cyclobenzaprine, meclizine, magnesium oxide, Vitamin D-3, carvedilol, furosemide, sucralfate,  acetaminophen, budesonide-formoterol, montelukast, diphenhydrAMINE, dicyclomine, zinc sulfate, losartan, hydrOXYzine, omeprazole, celecoxib, ibuprofen, HYDROcodone-acetaminophen, Ferrous Fumarate-Folic Acid, loratadine, metoCLOPramide, loperamide, cholestyramine, fluticasone furoate-vilanterol, albuterol, promethazine, erythromycin, acyclovir, triamcinolone cream, hydrOXYzine, diphenoxylate-atropine, and cephALEXin.  No orders of the defined types were placed in this encounter.

## 2022-04-21 ENCOUNTER — Ambulatory Visit (INDEPENDENT_AMBULATORY_CARE_PROVIDER_SITE_OTHER): Payer: Medicare Other | Admitting: Family Medicine

## 2022-04-21 ENCOUNTER — Encounter: Payer: Self-pay | Admitting: Family Medicine

## 2022-04-21 VITALS — BP 122/72 | HR 85 | Resp 20 | Ht 65.0 in | Wt 200.0 lb

## 2022-04-21 DIAGNOSIS — R197 Diarrhea, unspecified: Secondary | ICD-10-CM | POA: Diagnosis not present

## 2022-04-21 DIAGNOSIS — D696 Thrombocytopenia, unspecified: Secondary | ICD-10-CM

## 2022-04-21 DIAGNOSIS — I10 Essential (primary) hypertension: Secondary | ICD-10-CM

## 2022-04-21 DIAGNOSIS — R739 Hyperglycemia, unspecified: Secondary | ICD-10-CM | POA: Diagnosis not present

## 2022-04-21 DIAGNOSIS — R7989 Other specified abnormal findings of blood chemistry: Secondary | ICD-10-CM

## 2022-04-21 DIAGNOSIS — C9000 Multiple myeloma not having achieved remission: Secondary | ICD-10-CM | POA: Diagnosis not present

## 2022-04-21 DIAGNOSIS — R790 Abnormal level of blood mineral: Secondary | ICD-10-CM | POA: Diagnosis not present

## 2022-04-21 DIAGNOSIS — E785 Hyperlipidemia, unspecified: Secondary | ICD-10-CM

## 2022-04-21 DIAGNOSIS — R700 Elevated erythrocyte sedimentation rate: Secondary | ICD-10-CM | POA: Diagnosis not present

## 2022-04-21 LAB — SEDIMENTATION RATE: Sed Rate: >130 mm/hr — ABNORMAL HIGH (ref 0–30)

## 2022-04-21 LAB — LIPID PANEL
Cholesterol: 171 mg/dL (ref 0–200)
HDL: 49.4 mg/dL (ref >39.00)
LDL Cholesterol: 100 mg/dL — ABNORMAL HIGH (ref 0–99)
NonHDL: 121.43
Total CHOL/HDL Ratio: 3
Triglycerides: 105 mg/dL (ref 0.0–149.0)
VLDL: 21 mg/dL (ref 0.0–40.0)

## 2022-04-21 LAB — CBC
HCT: 37.5 % (ref 36.0–46.0)
Hemoglobin: 12.2 g/dL (ref 12.0–15.0)
MCHC: 32.4 g/dL (ref 30.0–36.0)
MCV: 87.3 fl (ref 78.0–100.0)
Platelets: 127 10*3/uL — ABNORMAL LOW (ref 150.0–400.0)
RBC: 4.29 Mil/uL (ref 3.87–5.11)
RDW: 15.1 % (ref 11.5–15.5)
WBC: 2.5 10*3/uL — ABNORMAL LOW (ref 4.0–10.5)

## 2022-04-21 LAB — MAGNESIUM: Magnesium: 1.8 mg/dL (ref 1.5–2.5)

## 2022-04-21 LAB — HEMOGLOBIN A1C: Hgb A1c MFr Bld: 5.8 % (ref 4.6–6.5)

## 2022-04-21 LAB — COMPREHENSIVE METABOLIC PANEL
ALT: 14 U/L (ref 0–35)
AST: 23 U/L (ref 0–37)
Albumin: 3.5 g/dL (ref 3.5–5.2)
Alkaline Phosphatase: 52 U/L (ref 39–117)
BUN: 15 mg/dL (ref 6–23)
CO2: 27 mEq/L (ref 19–32)
Calcium: 9.1 mg/dL (ref 8.4–10.5)
Chloride: 105 mEq/L (ref 96–112)
Creatinine, Ser: 0.58 mg/dL (ref 0.40–1.20)
GFR: 93.33 mL/min (ref >60.00)
Glucose, Bld: 96 mg/dL (ref 70–99)
Potassium: 3.7 mEq/L (ref 3.5–5.1)
Sodium: 138 mEq/L (ref 135–145)
Total Bilirubin: 0.4 mg/dL (ref 0.2–1.2)
Total Protein: 8 g/dL (ref 6.0–8.3)

## 2022-04-21 LAB — TSH: TSH: 1.97 u[IU]/mL (ref 0.35–5.50)

## 2022-04-21 NOTE — Assessment & Plan Note (Signed)
Repeat se rate today

## 2022-04-21 NOTE — Assessment & Plan Note (Signed)
hgba1c acceptable, minimize simple carbs. Increase exercise as tolerated.  

## 2022-04-21 NOTE — Assessment & Plan Note (Signed)
Encourage heart healthy diet such as MIND or DASH diet, increase exercise, avoid trans fats, simple carbohydrates and processed foods, consider a krill or fish or flaxseed oil cap daily.  °

## 2022-04-21 NOTE — Patient Instructions (Addendum)
No [place] or [place]  No artifical sweeteners such as Sucralose, Aspartame, Erythritol, Xylitol, Stevia etc because they can contribute to diarrhea.  24-48 hours of clear liquids then 24-48 hours of simple carbohydrates.   Benefiber or Metamucil 2-3 x a day  60-80 ounces of clear fluids   Diarrhea, Adult Diarrhea is frequent loose and watery bowel movements. Diarrhea can make you feel weak and cause you to become dehydrated. Dehydration can make you tired and thirsty, cause you to have a dry mouth, and decrease how often you urinate. Diarrhea typically lasts 2-3 days. However, it can last longer if it is a sign of something more serious. It is important to treat your diarrhea as told by your health care provider. Follow these instructions at home: Eating and drinking     Follow these recommendations as told by your health care provider: Take an oral rehydration solution (ORS). This is an over-the-counter medicine that helps return your body to its normal balance of nutrients and water. It is found at pharmacies and retail stores. Drink plenty of fluids, such as water, ice chips, diluted fruit juice, and low-calorie sports drinks. You can drink milk also, if desired. Avoid drinking fluids that contain a lot of sugar or caffeine, such as energy drinks, sports drinks, and soda. Eat bland, easy-to-digest foods in small amounts as you are able. These foods include bananas, applesauce, rice, lean meats, toast, and crackers. Avoid alcohol. Avoid spicy or fatty foods.  Medicines Take over-the-counter and prescription medicines only as told by your health care provider. If you were prescribed an antibiotic medicine, take it as told by your health care provider. Do not stop using the antibiotic even if you start to feel better. General instructions  Wash your hands often using soap and water. If soap and water are not available, use a hand sanitizer. Others in the household should wash  their hands as well. Hands should be washed: After using the toilet or changing a diaper. Before preparing, cooking, or serving food. While caring for a sick person or while visiting someone in a hospital. Drink enough fluid to keep your urine pale yellow. Rest at home while you recover. Watch your condition for any changes. Take a warm bath to relieve any burning or pain from frequent diarrhea episodes. Keep all follow-up visits as told by your health care provider. This is important. Contact a health care provider if: You have a fever. Your diarrhea gets worse. You have new symptoms. You cannot keep fluids down. You feel light-headed or dizzy. You have a headache. You have muscle cramps. Get help right away if: You have chest pain. You feel extremely weak or you faint. You have bloody or black stools or stools that look like tar. You have severe pain, cramping, or bloating in your abdomen. You have trouble breathing or you are breathing very quickly. Your heart is beating very quickly. Your skin feels cold and clammy. You feel confused. You have signs of dehydration, such as: Dark urine, very little urine, or no urine. Cracked lips. Dry mouth. Sunken eyes. Sleepiness. Weakness. Summary Diarrhea is frequent loose and sometimes watery bowel movements. Diarrhea can make you feel weak and cause you to become dehydrated. Drink enough fluids to keep your urine pale yellow. Make sure that you wash your hands after using the toilet. If soap and water are not available, use hand sanitizer. Contact a health care provider if your diarrhea gets worse or you have new symptoms. Get help  right away if you have signs of dehydration. This information is not intended to replace advice given to you by your health care provider. Make sure you discuss any questions you have with your health care provider. Document Revised: 05/07/2021 Document Reviewed: 05/07/2021 Elsevier Patient Education   [place].

## 2022-04-21 NOTE — Assessment & Plan Note (Signed)
Following with oncology and stable. Has needed fluids recently for dehydration

## 2022-04-21 NOTE — Assessment & Plan Note (Signed)
Well controlled, no changes to meds. Encouraged heart healthy diet such as the DASH diet and exercise as tolerated.  °

## 2022-04-21 NOTE — Assessment & Plan Note (Signed)
Supplement and monitor 

## 2022-04-21 NOTE — Assessment & Plan Note (Signed)
Still having roughly 4 loose stool daily. Cdiff negative and she confirms it does not have that smell. discussed strategies and will check labs today. Has just restarted Lomotil prn. No artifical sweeteners such as Sucralose, Aspartame, Erythritol, Xylitol, Stevia etc because they can contribute to diarrhea.  24-48 hours of clear liquids then 24-48 hours of simple carbohydrates.   Benefiber or Metamucil 2-3 x a day  60-80 ounces of clear fluids

## 2022-04-21 NOTE — Assessment & Plan Note (Signed)
Check CBC today. She is noting a slight increase in bruising most notably in left thigh. No obvious trauma. No bleeding gums and her hemorrhoidal bleeding is not worsening

## 2022-04-21 NOTE — Assessment & Plan Note (Signed)
Has had her supplementation dropped some to see if that helps her diarrhea but so far has not helped. Recheck levels today and consider dropping further

## 2022-04-27 ENCOUNTER — Emergency Department (HOSPITAL_BASED_OUTPATIENT_CLINIC_OR_DEPARTMENT_OTHER): Payer: Medicare Other

## 2022-04-27 ENCOUNTER — Other Ambulatory Visit: Payer: Self-pay

## 2022-04-27 ENCOUNTER — Inpatient Hospital Stay (HOSPITAL_BASED_OUTPATIENT_CLINIC_OR_DEPARTMENT_OTHER)
Admission: EM | Admit: 2022-04-27 | Discharge: 2022-04-30 | DRG: 389 | Disposition: A | Discharge status: Home / Self Care | Payer: Medicare Other | Attending: Internal Medicine | Admitting: Internal Medicine

## 2022-04-27 ENCOUNTER — Encounter (HOSPITAL_BASED_OUTPATIENT_CLINIC_OR_DEPARTMENT_OTHER): Payer: Self-pay | Admitting: Urology

## 2022-04-27 DIAGNOSIS — Z800 Family history of malignant neoplasm of digestive organs: Secondary | ICD-10-CM

## 2022-04-27 DIAGNOSIS — Z9049 Acquired absence of other specified parts of digestive tract: Secondary | ICD-10-CM

## 2022-04-27 DIAGNOSIS — Z90710 Acquired absence of both cervix and uterus: Secondary | ICD-10-CM

## 2022-04-27 DIAGNOSIS — K5651 Intestinal adhesions [bands], with partial obstruction: Principal | ICD-10-CM | POA: Diagnosis present

## 2022-04-27 DIAGNOSIS — E860 Dehydration: Secondary | ICD-10-CM | POA: Diagnosis present

## 2022-04-27 DIAGNOSIS — Z8673 Personal history of transient ischemic attack (TIA), and cerebral infarction without residual deficits: Secondary | ICD-10-CM

## 2022-04-27 DIAGNOSIS — Z95810 Presence of automatic (implantable) cardiac defibrillator: Secondary | ICD-10-CM | POA: Diagnosis not present

## 2022-04-27 DIAGNOSIS — G4733 Obstructive sleep apnea (adult) (pediatric): Secondary | ICD-10-CM | POA: Diagnosis present

## 2022-04-27 DIAGNOSIS — T50916A Underdosing of multiple unspecified drugs, medicaments and biological substances, initial encounter: Secondary | ICD-10-CM | POA: Diagnosis present

## 2022-04-27 DIAGNOSIS — I110 Hypertensive heart disease with heart failure: Secondary | ICD-10-CM | POA: Diagnosis present

## 2022-04-27 DIAGNOSIS — R1013 Epigastric pain: Secondary | ICD-10-CM | POA: Diagnosis not present

## 2022-04-27 DIAGNOSIS — C9000 Multiple myeloma not having achieved remission: Secondary | ICD-10-CM | POA: Diagnosis not present

## 2022-04-27 DIAGNOSIS — Z83438 Family history of other disorder of lipoprotein metabolism and other lipidemia: Secondary | ICD-10-CM

## 2022-04-27 DIAGNOSIS — Z5320 Procedure and treatment not carried out because of patient's decision for unspecified reasons: Secondary | ICD-10-CM | POA: Diagnosis not present

## 2022-04-27 DIAGNOSIS — C9001 Multiple myeloma in remission: Secondary | ICD-10-CM | POA: Diagnosis present

## 2022-04-27 DIAGNOSIS — Z7982 Long term (current) use of aspirin: Secondary | ICD-10-CM | POA: Diagnosis not present

## 2022-04-27 DIAGNOSIS — R933 Abnormal findings on diagnostic imaging of other parts of digestive tract: Secondary | ICD-10-CM | POA: Diagnosis present

## 2022-04-27 DIAGNOSIS — M799 Soft tissue disorder, unspecified: Secondary | ICD-10-CM

## 2022-04-27 DIAGNOSIS — K219 Gastro-esophageal reflux disease without esophagitis: Secondary | ICD-10-CM | POA: Diagnosis not present

## 2022-04-27 DIAGNOSIS — K529 Noninfective gastroenteritis and colitis, unspecified: Secondary | ICD-10-CM | POA: Diagnosis present

## 2022-04-27 DIAGNOSIS — Z8261 Family history of arthritis: Secondary | ICD-10-CM | POA: Diagnosis not present

## 2022-04-27 DIAGNOSIS — Z7951 Long term (current) use of inhaled steroids: Secondary | ICD-10-CM

## 2022-04-27 DIAGNOSIS — Z79899 Other long term (current) drug therapy: Secondary | ICD-10-CM

## 2022-04-27 DIAGNOSIS — E785 Hyperlipidemia, unspecified: Secondary | ICD-10-CM | POA: Diagnosis present

## 2022-04-27 DIAGNOSIS — Z91138 Patient's unintentional underdosing of medication regimen for other reason: Secondary | ICD-10-CM

## 2022-04-27 DIAGNOSIS — Z888 Allergy status to other drugs, medicaments and biological substances status: Secondary | ICD-10-CM

## 2022-04-27 DIAGNOSIS — Z885 Allergy status to narcotic agent status: Secondary | ICD-10-CM

## 2022-04-27 DIAGNOSIS — K56600 Partial intestinal obstruction, unspecified as to cause: Principal | ICD-10-CM

## 2022-04-27 DIAGNOSIS — I10 Essential (primary) hypertension: Secondary | ICD-10-CM | POA: Diagnosis not present

## 2022-04-27 DIAGNOSIS — R109 Unspecified abdominal pain: Secondary | ICD-10-CM | POA: Diagnosis not present

## 2022-04-27 DIAGNOSIS — E669 Obesity, unspecified: Secondary | ICD-10-CM

## 2022-04-27 DIAGNOSIS — K56609 Unspecified intestinal obstruction, unspecified as to partial versus complete obstruction: Secondary | ICD-10-CM | POA: Diagnosis not present

## 2022-04-27 DIAGNOSIS — E876 Hypokalemia: Secondary | ICD-10-CM | POA: Diagnosis not present

## 2022-04-27 DIAGNOSIS — Z833 Family history of diabetes mellitus: Secondary | ICD-10-CM

## 2022-04-27 DIAGNOSIS — Z6833 Body mass index (BMI) 33.0-33.9, adult: Secondary | ICD-10-CM

## 2022-04-27 DIAGNOSIS — I509 Heart failure, unspecified: Secondary | ICD-10-CM

## 2022-04-27 DIAGNOSIS — Z8249 Family history of ischemic heart disease and other diseases of the circulatory system: Secondary | ICD-10-CM | POA: Diagnosis not present

## 2022-04-27 DIAGNOSIS — R197 Diarrhea, unspecified: Secondary | ICD-10-CM | POA: Diagnosis not present

## 2022-04-27 DIAGNOSIS — I5042 Chronic combined systolic (congestive) and diastolic (congestive) heart failure: Secondary | ICD-10-CM | POA: Diagnosis present

## 2022-04-27 DIAGNOSIS — K6389 Other specified diseases of intestine: Secondary | ICD-10-CM | POA: Diagnosis not present

## 2022-04-27 LAB — CBC
HCT: 39.5 % (ref 36.0–46.0)
Hemoglobin: 12.8 g/dL (ref 12.0–15.0)
MCH: 28.4 pg (ref 26.0–34.0)
MCHC: 32.4 g/dL (ref 30.0–36.0)
MCV: 87.6 fL (ref 80.0–100.0)
Platelets: 145 10*3/uL — ABNORMAL LOW (ref 150–400)
RBC: 4.51 MIL/uL (ref 3.87–5.11)
RDW: 13.5 % (ref 11.5–15.5)
WBC: 4.1 10*3/uL (ref 4.0–10.5)
nRBC: 0 % (ref 0.0–0.2)

## 2022-04-27 LAB — COMPREHENSIVE METABOLIC PANEL WITH GFR
ALT: 21 U/L (ref 0–44)
AST: 32 U/L (ref 15–41)
Albumin: 3.6 g/dL (ref 3.5–5.0)
Alkaline Phosphatase: 55 U/L (ref 38–126)
Anion gap: 8 (ref 5–15)
BUN: 16 mg/dL (ref 8–23)
CO2: 23 mmol/L (ref 22–32)
Calcium: 9.3 mg/dL (ref 8.9–10.3)
Chloride: 105 mmol/L (ref 98–111)
Creatinine, Ser: 0.69 mg/dL (ref 0.44–1.00)
GFR, Estimated: >60 mL/min (ref >60)
Glucose, Bld: 150 mg/dL — ABNORMAL HIGH (ref 70–99)
Potassium: 3.9 mmol/L (ref 3.5–5.1)
Sodium: 136 mmol/L (ref 135–145)
Total Bilirubin: 0.6 mg/dL (ref 0.3–1.2)
Total Protein: 9.2 g/dL — ABNORMAL HIGH (ref 6.5–8.1)

## 2022-04-27 LAB — MAGNESIUM: Magnesium: 1.9 mg/dL (ref 1.7–2.4)

## 2022-04-27 LAB — LIPASE, BLOOD: Lipase: 33 U/L (ref 11–51)

## 2022-04-27 MED ORDER — IOHEXOL 300 MG/ML  SOLN
100.0000 mL | Freq: Once | INTRAMUSCULAR | Status: AC | PRN
  Administered 2022-04-27: 100 mL via INTRAVENOUS

## 2022-04-27 MED ORDER — HYDROMORPHONE HCL 1 MG/ML IJ SOLN
1.0000 mg | Freq: Once | INTRAMUSCULAR | Status: AC
  Administered 2022-04-27: 1 mg via INTRAVENOUS
  Filled 2022-04-27: qty 1

## 2022-04-27 MED ORDER — FAMOTIDINE IN NACL 20-0.9 MG/50ML-% IV SOLN
20.0000 mg | Freq: Once | INTRAVENOUS | Status: AC
  Administered 2022-04-27: 20 mg via INTRAVENOUS
  Filled 2022-04-27: qty 50

## 2022-04-27 MED ORDER — METOCLOPRAMIDE HCL 5 MG/ML IJ SOLN
10.0000 mg | Freq: Once | INTRAMUSCULAR | Status: AC
  Administered 2022-04-27: 10 mg via INTRAVENOUS
  Filled 2022-04-27: qty 2

## 2022-04-27 MED ORDER — SODIUM CHLORIDE 0.9 % IV BOLUS
1000.0000 mL | Freq: Once | INTRAVENOUS | Status: AC
  Administered 2022-04-27: 1000 mL via INTRAVENOUS

## 2022-04-27 NOTE — ED Notes (Signed)
Pt in CT.

## 2022-04-27 NOTE — ED Notes (Signed)
Pt assisted to transfer to [facility], standby assistance.

## 2022-04-27 NOTE — ED Notes (Signed)
Pt hypertensive and diaphoretic at triage, reports severe intermittent abdominal pains

## 2022-04-27 NOTE — ED Notes (Signed)
EDP Trifan made aware of pts manual BP of 170/118.

## 2022-04-27 NOTE — ED Triage Notes (Signed)
Epigastric pain and nausea that started today  States "feeling feverish on the inside"  States diarrhea x 3 weeks

## 2022-04-27 NOTE — ED Provider Notes (Signed)
[name] Provider Note   CSN: [id number] Arrival date & time: 04/27/22  2016     History {Add pertinent medical, surgical, social history, OB history to HPI:1} Chief Complaint  Patient presents with   Abdominal Pain    ELYSSE [name] is a 66 y.o. female presenting to the ED with abdominal pain.  She has been having abdominal pains on and off for 2 months but feels that it worsened in the past 2 days.  She was having significant chronic diarrhea, had a CT of the abdomen to start of the month, subsequently had a negative C. difficile test performed by her PCP per my review of external records.  She reports that she was on antidiarrheal agents, 2 days ago she became constipated.  She has not had a bowel movement or passed gas in 2 days.  She feels nauseated and vomited several times.  She has a history of cholecystectomy and history of bowel obstructions 10 years ago.  She says she feels "feverish inside today".  HPI     Home Medications Prior to Admission medications   Medication Sig Start Date End Date Taking? Authorizing Provider  acetaminophen (TYLENOL) 500 MG tablet Take 500 mg by mouth every 6 (six) hours as needed.    [provider]  acyclovir (ZOVIRAX) 400 MG tablet TAKE ONE (1) TABLET BY MOUTH TWO (2) TIMES DAILY 12/31/21   [name], MD  albuterol (VENTOLIN HFA) 108 (90 Base) MCG/ACT inhaler Inhale 2 puffs into the lungs every 6 (six) hours as needed for wheezing or shortness of breath. 11/20/21   Mosie Lukes, MD  aspirin EC 81 MG tablet Take 1 tablet (81 mg total) by mouth daily. 02/15/18   Mosie Lukes, MD  budesonide-formoterol [facility]) 160-4.5 MCG/ACT inhaler Inhale 2 puffs into the lungs as needed. 10/01/20   Mosie Lukes, MD  carvedilol (COREG) 25 MG tablet Take 1 tablet (25 mg total) by mouth 2 (two) times daily with a meal. 06/04/20   Mosie Lukes, MD  celecoxib (CELEBREX) 100 MG capsule Take 1 capsule (100 mg  total) by mouth 2 (two) times daily as needed for mild pain or moderate pain (with food). 08/07/21   Mosie Lukes, MD  cephALEXin (KEFLEX) 500 MG capsule Take 500 mg by mouth 3 (three) times daily. 01/30/22   [provider]  Cholecalciferol (VITAMIN D-3) 25 MCG (1000 UT) CAPS Take 1 capsule (1,000 Units total) by mouth daily. 05/03/20   Mosie Lukes, MD  cholestyramine Lucrezia Starch) 4 g packet Take 1 packet (4 g total) by mouth 2 (two) times daily. 11/20/21   Mosie Lukes, MD  cyclobenzaprine (FLEXERIL) 10 MG tablet Take 1 tablet (10 mg total) by mouth at bedtime. Patient taking differently: Take 10 mg by mouth as needed. 02/19/20   Saguier, Percell Miller, PA-C  dicyclomine (BENTYL) 10 MG capsule Take 1 tab every 6 hours as needed for abdominal cramping. 05/28/21   Shelda Pal, DO  diphenhydrAMINE (BENADRYL) 25 mg capsule TAKE ONE (1) CAPSULE BY MOUTH THREE (3) TIMES EACH DAY AS NEEDED 03/11/21   [provider]  diphenoxylate-atropine (LOMOTIL) 2.5-0.025 MG tablet TAKE ONE (1) TABLET BY MOUTH FOUR (4) TIMES DAILY AS NEEDED FOR DIARRHEA OR LOOSE STOOLS 04/03/22   Mosie Lukes, MD  erythromycin ophthalmic ointment 1 application QID. 7/85/88   [provider]  Ferrous Fumarate-Folic Acid 502-7 MG TABS Take 324 mg by mouth daily. 08/29/21  Mosie Lukes, MD  fluticasone (FLONASE) 50 MCG/ACT nasal spray Place 2 sprays into both nostrils daily. Patient taking differently: Place 2 sprays into both nostrils as needed. 01/07/20   Joy, Shawn C, PA-C  fluticasone furoate-vilanterol (BREO ELLIPTA) 100-25 MCG/ACT AEPB Inhale 1 puff into the lungs daily. Patient taking differently: Inhale 1 puff into the lungs as needed. 11/20/21   Mosie Lukes, MD  furosemide (LASIX) 20 MG tablet Take 1 tablet by mouth as needed. 07/03/20   [provider]  HYDROcodone-acetaminophen (NORCO/VICODIN) 5-325 MG tablet Take 1 tablet by mouth every 6 (six) hours as needed. 08/25/21    Suzy Bouchard, PA-C  hydrOXYzine (ATARAX) 10 MG tablet TAKE ONE (1) TABLET BY MOUTH 3 TIMES DAILY AS NEEDED FOR ITCHING 03/13/22   Saguier, Percell Miller, PA-C  hydrOXYzine (ATARAX/VISTARIL) 25 MG tablet Take 25 mg by mouth as needed. 04/25/21   [provider]  ibuprofen (ADVIL) 800 MG tablet Take 1 tablet (800 mg total) by mouth 3 (three) times daily. Patient taking differently: Take 800 mg by mouth as needed. 08/25/21   Suzy Bouchard, PA-C  loperamide (IMODIUM) 2 MG capsule Take 2 mg by mouth as needed. 11/09/21   [provider]  loratadine (CLARITIN) 10 MG tablet Take 1 tablet (10 mg total) by mouth daily. 09/08/21   Shelda Pal, DO  losartan (COZAAR) 50 MG tablet Take 50 mg by mouth daily. 04/14/21   [provider]  magnesium oxide (MAG-OX) 400 MG tablet Take 1 tablet (400 mg total) by mouth 2 (two) times daily. 03/04/20   Mosie Lukes, MD  meclizine (ANTIVERT) 12.5 MG tablet Take 12.5 mg by mouth as needed. 02/18/20   [provider]  metoCLOPramide (REGLAN) 10 MG tablet Take 10 mg by mouth as needed. 10/17/21   [provider]  montelukast (SINGULAIR) 10 MG tablet Take 1 tablet (10 mg total) by mouth at bedtime. Patient taking differently: Take 10 mg by mouth as needed. 02/14/21   Debbrah Alar, NP  Multiple Vitamins-Minerals (MULTIVITAMIN ADULT PO) Take 1 tablet by mouth daily.    [provider]  omeprazole (PRILOSEC) 40 MG capsule Take 1 capsule (40 mg total) by mouth in the morning and at bedtime. 07/25/21   Shelda Pal, DO  promethazine (PHENERGAN) 25 MG tablet Take 1 tablet (25 mg total) by mouth every 8 (eight) hours as needed for nausea or vomiting (headache). 11/25/21   Mosie Lukes, MD  sucralfate (CARAFATE) 1 g tablet Take 1 tablet (1 g total) by mouth 3 (three) times daily as needed. prn 07/30/20   Mosie Lukes, MD  topiramate (TOPAMAX) 50 MG tablet Take 1 tablet (50 mg total) by mouth 2 (two)  times daily. 07/13/19   Mosie Lukes, MD  triamcinolone cream (KENALOG) 0.1 % Apply 1 application topically 2 (two) times daily. 01/02/22   Shelda Pal, DO  zinc sulfate 220 (50 Zn) MG capsule Take 220 mg by mouth 2 (two) times daily. 06/16/21   [provider]  prochlorperazine (COMPAZINE) 10 MG tablet Take 1 tablet (10 mg total) by mouth every 6 (six) hours as needed (Nausea or vomiting). 09/04/20 12/19/20  [name], MD      Allergies    Benazepril, Ondansetron hcl, Codeine, and Morphine    Review of Systems   Review of Systems  Physical Exam Updated Vital Signs BP (!) 170/118   Pulse 94   Temp 98.4 F (36.9 C) (Oral)  Resp (!) 26   Ht '5\' 5"'$  (1.651 m)   Wt 90.7 kg   SpO2 98%   BMI 33.28 kg/m  Physical Exam Constitutional:      General: She is not in acute distress. HENT:     Head: Normocephalic and atraumatic.  Eyes:     Conjunctiva/sclera: Conjunctivae normal.     Pupils: Pupils are equal, round, and reactive to light.  Cardiovascular:     Rate and Rhythm: Normal rate and regular rhythm.  Pulmonary:     Effort: Pulmonary effort is normal. No respiratory distress.  Abdominal:     General: There is no distension.     Tenderness: There is abdominal tenderness in the epigastric area. There is no guarding.  Skin:    General: Skin is warm and dry.  Neurological:     General: No focal deficit present.     Mental Status: She is alert. Mental status is at baseline.  Psychiatric:        Mood and Affect: Mood normal.        Behavior: Behavior normal.     ED Results / Procedures / Treatments   Labs (all labs ordered are listed, but only abnormal results are displayed) Labs Reviewed  COMPREHENSIVE METABOLIC PANEL - Abnormal; Notable for the following components:      Result Value   Glucose, Bld 150 (*)    Total Protein 9.2 (*)    All other components within normal limits  CBC - Abnormal; Notable for the following components:   Platelets 145  (*)    All other components within normal limits  LIPASE, BLOOD  URINALYSIS, ROUTINE W REFLEX MICROSCOPIC    EKG None  Radiology No results found.  Procedures Procedures  {Document cardiac monitor, telemetry assessment procedure when appropriate:1}  Medications Ordered in ED Medications - No data to display  ED Course/ Medical Decision Making/ A&P                           Medical Decision Making Amount and/or Complexity of Data Reviewed Labs: ordered. Radiology: ordered.  Risk Prescription drug management.   This patient presents to the Emergency Department with complaint of abdominal pain. This involves an extensive number of treatment options, and is a complaint that carries with it a high risk of complications and morbidity.  The differential diagnosis includes, but is not limited to, gastritis versus gastroparesis versus ileus with bowel obstruction versus colitis versus other  I ordered, reviewed, and interpreted labs, including BMP and CBC.  There were no immediate, life-threatening emergencies found in this labwork.  Patient's UA showed *** no signs of infection I ordered medication IV rate plan, IV fluids, IV Pepcid for abdominal pain and/or nausea I ordered imaging studies which included CT abdomen pelvis with contrast I independently visualized and interpreted imaging which showed *** and the monitor tracing which showed *** Additional history was obtained from patient's sister about the Previous records obtained and reviewed showing outpatient CT scan and C. difficile result from this month I personally reviewed the patients ECG atrial paced rhythm with no acute ischemic findings, several PACs noted.  After the interventions stated above, I reevaluated the patient and found that they remained clinically stable.  Based on the patient's clinical exam, vital signs, risk factors, and ED testing, I felt that the patient's overall risk of life-threatening emergency  such as bowel perforation, surgical emergency, or sepsis was quite low.  I suspect  this clinical presentation is most consistent with ***, but explained to the patient that this evaluation was not a definitive diagnostic workup.  I discussed outpatient follow up with primary care provider, and provided specialist office number on the patient's discharge paper if a referral was deemed necessary.  I discussed return precautions with the patient. I felt the patient was clinically stable for discharge.   {Document critical care time when appropriate:1} {Document review of labs and clinical decision tools ie heart score, Chads2Vasc2 etc:1}  {Document your independent review of radiology images, and any outside records:1} {Document your discussion with family members, caretakers, and with consultants:1} {Document social determinants of health affecting pt's care:1} {Document your decision making why or why not admission, treatments were needed:1} Final Clinical Impression(s) / ED Diagnoses Final diagnoses:  None    Rx / DC Orders ED Discharge Orders     None

## 2022-04-28 ENCOUNTER — Emergency Department (HOSPITAL_BASED_OUTPATIENT_CLINIC_OR_DEPARTMENT_OTHER): Payer: Medicare Other

## 2022-04-28 DIAGNOSIS — E669 Obesity, unspecified: Secondary | ICD-10-CM

## 2022-04-28 DIAGNOSIS — R197 Diarrhea, unspecified: Secondary | ICD-10-CM | POA: Diagnosis not present

## 2022-04-28 DIAGNOSIS — Z800 Family history of malignant neoplasm of digestive organs: Secondary | ICD-10-CM | POA: Diagnosis not present

## 2022-04-28 DIAGNOSIS — Z8261 Family history of arthritis: Secondary | ICD-10-CM | POA: Diagnosis not present

## 2022-04-28 DIAGNOSIS — Z6833 Body mass index (BMI) 33.0-33.9, adult: Secondary | ICD-10-CM | POA: Diagnosis not present

## 2022-04-28 DIAGNOSIS — Z8673 Personal history of transient ischemic attack (TIA), and cerebral infarction without residual deficits: Secondary | ICD-10-CM | POA: Diagnosis not present

## 2022-04-28 DIAGNOSIS — C9000 Multiple myeloma not having achieved remission: Secondary | ICD-10-CM

## 2022-04-28 DIAGNOSIS — K6389 Other specified diseases of intestine: Secondary | ICD-10-CM | POA: Diagnosis not present

## 2022-04-28 DIAGNOSIS — K5651 Intestinal adhesions [bands], with partial obstruction: Secondary | ICD-10-CM | POA: Diagnosis present

## 2022-04-28 DIAGNOSIS — C9001 Multiple myeloma in remission: Secondary | ICD-10-CM | POA: Diagnosis present

## 2022-04-28 DIAGNOSIS — M799 Soft tissue disorder, unspecified: Secondary | ICD-10-CM

## 2022-04-28 DIAGNOSIS — I10 Essential (primary) hypertension: Secondary | ICD-10-CM

## 2022-04-28 DIAGNOSIS — I5042 Chronic combined systolic (congestive) and diastolic (congestive) heart failure: Secondary | ICD-10-CM

## 2022-04-28 DIAGNOSIS — K56609 Unspecified intestinal obstruction, unspecified as to partial versus complete obstruction: Secondary | ICD-10-CM

## 2022-04-28 DIAGNOSIS — Z90710 Acquired absence of both cervix and uterus: Secondary | ICD-10-CM | POA: Diagnosis not present

## 2022-04-28 DIAGNOSIS — K219 Gastro-esophageal reflux disease without esophagitis: Secondary | ICD-10-CM | POA: Diagnosis present

## 2022-04-28 DIAGNOSIS — K5669 Other partial intestinal obstruction: Secondary | ICD-10-CM | POA: Diagnosis not present

## 2022-04-28 DIAGNOSIS — Z7982 Long term (current) use of aspirin: Secondary | ICD-10-CM | POA: Diagnosis not present

## 2022-04-28 DIAGNOSIS — Z5320 Procedure and treatment not carried out because of patient's decision for unspecified reasons: Secondary | ICD-10-CM | POA: Diagnosis not present

## 2022-04-28 DIAGNOSIS — Z833 Family history of diabetes mellitus: Secondary | ICD-10-CM | POA: Diagnosis not present

## 2022-04-28 DIAGNOSIS — Z95810 Presence of automatic (implantable) cardiac defibrillator: Secondary | ICD-10-CM | POA: Diagnosis not present

## 2022-04-28 DIAGNOSIS — Z8249 Family history of ischemic heart disease and other diseases of the circulatory system: Secondary | ICD-10-CM | POA: Diagnosis not present

## 2022-04-28 DIAGNOSIS — G4733 Obstructive sleep apnea (adult) (pediatric): Secondary | ICD-10-CM | POA: Diagnosis present

## 2022-04-28 DIAGNOSIS — Z83438 Family history of other disorder of lipoprotein metabolism and other lipidemia: Secondary | ICD-10-CM | POA: Diagnosis not present

## 2022-04-28 DIAGNOSIS — E860 Dehydration: Secondary | ICD-10-CM | POA: Diagnosis present

## 2022-04-28 DIAGNOSIS — E785 Hyperlipidemia, unspecified: Secondary | ICD-10-CM | POA: Diagnosis present

## 2022-04-28 DIAGNOSIS — Z9049 Acquired absence of other specified parts of digestive tract: Secondary | ICD-10-CM | POA: Diagnosis not present

## 2022-04-28 DIAGNOSIS — I110 Hypertensive heart disease with heart failure: Secondary | ICD-10-CM | POA: Diagnosis present

## 2022-04-28 DIAGNOSIS — E876 Hypokalemia: Secondary | ICD-10-CM | POA: Diagnosis present

## 2022-04-28 DIAGNOSIS — K529 Noninfective gastroenteritis and colitis, unspecified: Secondary | ICD-10-CM | POA: Diagnosis present

## 2022-04-28 DIAGNOSIS — R933 Abnormal findings on diagnostic imaging of other parts of digestive tract: Secondary | ICD-10-CM | POA: Diagnosis not present

## 2022-04-28 MED ORDER — ENOXAPARIN SODIUM 40 MG/0.4ML IJ SOSY
40.0000 mg | PREFILLED_SYRINGE | INTRAMUSCULAR | Status: DC
  Administered 2022-04-28 – 2022-04-29 (×2): 40 mg via SUBCUTANEOUS
  Filled 2022-04-28 (×2): qty 0.4

## 2022-04-28 MED ORDER — ASPIRIN 81 MG PO TBEC
81.0000 mg | DELAYED_RELEASE_TABLET | Freq: Every day | ORAL | Status: DC
  Administered 2022-04-28 – 2022-04-30 (×3): 81 mg via ORAL
  Filled 2022-04-28 (×3): qty 1

## 2022-04-28 MED ORDER — SPIRONOLACTONE 12.5 MG HALF TABLET
12.5000 mg | ORAL_TABLET | Freq: Every day | ORAL | Status: DC
  Administered 2022-04-28 – 2022-04-30 (×3): 12.5 mg via ORAL
  Filled 2022-04-28 (×3): qty 1

## 2022-04-28 MED ORDER — CARVEDILOL 25 MG PO TABS: 25.0000 mg | ORAL_TABLET | Freq: Two times a day (BID) | ORAL | Status: DC

## 2022-04-28 MED ORDER — MAGNESIUM OXIDE -MG SUPPLEMENT 400 (240 MG) MG PO TABS
400.0000 mg | ORAL_TABLET | Freq: Two times a day (BID) | ORAL | Status: DC
  Administered 2022-04-28: 400 mg via ORAL
  Filled 2022-04-28 (×4): qty 1

## 2022-04-28 MED ORDER — CARVEDILOL 25 MG PO TABS
25.0000 mg | ORAL_TABLET | Freq: Two times a day (BID) | ORAL | Status: DC
  Administered 2022-04-28 – 2022-04-30 (×4): 25 mg via ORAL
  Filled 2022-04-28 (×4): qty 1

## 2022-04-28 MED ORDER — LOSARTAN POTASSIUM 50 MG PO TABS
50.0000 mg | ORAL_TABLET | Freq: Every day | ORAL | Status: DC
  Administered 2022-04-28 – 2022-04-30 (×3): 50 mg via ORAL
  Filled 2022-04-28 (×3): qty 1

## 2022-04-28 NOTE — Consult Note (Incomplete)
Tami [name] Feb 23, 1956  [id number].    Requesting MD: Dr. Octaviano Glow Chief Complaint/Reason for Consult: SBO  HPI: Tami [name] is a 66 y.o. female who presented to [facility] for abdominal pain.  Patient reports she has been having intermittent abdominal pain over the last 2 months with associated diarrhea.  She saw her PCP and had a negative C. difficile test as well as neg CT.  She has been trying antidiarrheal agents for this and 3 days ago became constipated with inability to pass flatus or have a bowel movement.  Shortly after began having constant, generalized abdominal pain, bloating, nausea and vomiting. Hx prior abdominal hysterectomy and cholecystectomy in the past. Notes hx of sbo in the past and but this feels different. CT A/P with pSBO with transition in the RLQ. We were asked to see.   Since admit patient's pain improved she began having return of bowel function.  She is tolerating liquids without any nausea or vomiting.  Bloating resolved.  Passing flatus.  Reports 5 episodes of diarrhea overnight.  Has some abdominal soreness that she thinks is from diarrhea and feels very different than her presenting abdominal pain.  PMhx: HTN, HLD, CVA, OSA, CHF, Presence of Pacemaker, Multiple Myeloma in remission  ROS: ROS As above  Family History  Problem Relation Age of Onset   Diabetes Mother    Hypertension Mother    Heart disease Mother        s/p 1 stent   Hyperlipidemia Mother    Arthritis Mother    Cancer Father        COLON   Colon cancer Father 8   Irritable bowel syndrome Sister    Hyperlipidemia Daughter    Hypertension Daughter    Hypertension Maternal Grandmother    Arthritis Maternal Grandmother    Heart disease Maternal Grandfather        MI   Hypertension Maternal Grandfather    Arthritis Maternal Grandfather    Hypertension Son    Esophageal cancer Neg Hx     Past Medical History:  Diagnosis Date   Abnormal SPEP 07/26/2016   Arthritis     knees, hands   Back pain 07/14/2016   Bowel obstruction ([place]) 11/2014   C. 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   Multiple myeloma ([place]) 07/20/2018   Multiple myeloma not having achieved remission ([place]) 07/20/2018   Myalgia 12/31/2016   Obstructive sleep apnea 03/31/2015   SOB (shortness of breath) 04/09/2016   Stroke ([place])    TIAs   UTI (urinary tract infection)     Past Surgical History:  Procedure Laterality Date   ABDOMINAL HYSTERECTOMY     menorraghia, 2006, total   CHOLECYSTECTOMY     COLONOSCOPY  2018   [facility] per patient   ESOPHAGOGASTRODUODENOSCOPY  2018   [place]     right, repair torn torn cartialage   PACEMAKER INSERTION  08/2014   TONSILLECTOMY     TUBAL LIGATION      Social History:  reports that she has never smoked. She has never used smokeless tobacco. She reports that she does not drink alcohol and does not use drugs.  Allergies:  Allergies  Allergen Reactions   Benazepril Anaphylaxis, Swelling and Hives    angioedema Throat and lip swelling   Ondansetron Hcl Hives    Redness and hives post IV admin on 07/05/17   Codeine Nausea And Vomiting   Morphine Hives    Redness and hives noted post IV admin on 07/05/17    (Not in a hospital admission)    Physical Exam: Blood pressure (!) 147/99, pulse 87, temperature 98.4 F (36.9 C), temperature source Oral, resp. rate 16, height 5\' 5"  (1.651 m), weight 90.7 kg, SpO2 96 %. General: pleasant, WD/WN female who is laying in bed in NAD HEENT: head is normocephalic, atraumatic.   Sclera are noninjected.  PERRL.  Ears and nose without any masses or lesions.  Mouth is pink and moist. Dentition fair Heart: regular, rate, and rhythm. Lungs: CTAB, no wheezes, rhonchi, or rales noted.  Respiratory effort nonlabored Abd: Very soft, minimal ttp in the epigastrium, ND, +BS, no masses, hernias, or organomegaly MS: no BUE/BLE edema, calves soft and nontender Skin: warm and dry with no masses, lesions, or rashes Psych: A&Ox4 with an appropriate affect Neuro: cranial nerves grossly intact,  normal speech, thought process intact, moves all extremities, gait not assessed   Results for orders placed or performed during the hospital encounter of 04/27/22 (from the past 48 hour(s))  Lipase, blood     Status: None   Collection Time: 04/27/22  8:46 PM  Result Value Ref Range   Lipase 33 11 - 51 U/L    Comment: Performed at [facility], [address] [place], [place]  Comprehensive metabolic panel     Status: Abnormal   Collection Time: 04/27/22  8:46 PM  Result Value Ref Range   Sodium 136 135 - 145 mmol/L   Potassium 3.9 3.5 - 5.1 mmol/L   Chloride 105 98 - 111 mmol/L   CO2 23 22 - 32 mmol/L   Glucose, Bld 150 (H) 70 - 99 mg/dL    Comment: Glucose reference range applies only to samples taken after fasting for at least 8 hours.   BUN 16 8 - 23 mg/dL   Creatinine, Ser 04/29/22 0.44 - 1.00 mg/dL   Calcium 9.3 8.9 - 6.66 mg/dL   Total Protein 9.2 (H) 6.5 - 8.1 g/dL   Albumin 3.6 3.5 - 5.0 g/dL   AST 32 15 - 41 U/L   ALT 21 0 - 44 U/L   Alkaline Phosphatase 55 38 - 126 U/L   Total Bilirubin 0.6 0.3 - 1.2 mg/dL   GFR, Estimated 28.8 >35 mL/min    Comment: (NOTE) Calculated using the CKD-EPI Creatinine Equation (2021)    Anion gap 8 5 - 15    Comment: Performed at [facility], [address] [place], [place]  CBC     Status: Abnormal   Collection Time: 04/27/22  8:46 PM  Result Value Ref Range   WBC 4.1 4.0 - 10.5 K/uL   RBC 4.51  3.87 - 5.11 MIL/uL   Hemoglobin 12.8 12.0 - 15.0 g/dL   HCT 04/29/22 08.1 - 64.6 %   MCV 87.6 80.0 - 100.0 fL   MCH 28.4 26.0 - 34.0 pg   MCHC 32.4 30.0 - 36.0 g/dL   RDW 72.5 91.2 - 64.4 %  Platelets 145 (L) 150 - 400 K/uL   nRBC 0.0 0.0 - 0.2 %    Comment: Performed at [facility], [address] [place], [place] [address]  Magnesium     Status: None   Collection Time: 04/27/22  9:43 PM  Result Value Ref Range   Magnesium 1.9 1.7 - 2.4 mg/dL    Comment: Performed at [facility], [address] [place], [place] [address]   *Note: Due to a large number of results and/or encounters for the requested time period, some results have not been displayed. A complete set of results can be found in Results Review.   CT ABDOMEN PELVIS W CONTRAST  Result Date: 04/27/2022 CLINICAL DATA:  Epigastric pain and nausea. EXAM: CT ABDOMEN AND PELVIS WITH CONTRAST TECHNIQUE: Multidetector CT imaging of the abdomen and pelvis was performed using the standard protocol following bolus administration of intravenous contrast. RADIATION DOSE REDUCTION: This exam was performed according to the departmental dose-optimization program which includes automated exposure control, adjustment of the mA and/or kV according to patient size and/or use of iterative reconstruction technique. CONTRAST:  OMNIPAQUE IOHEXOL 300 MG/ML  SOLN COMPARISON:  April 13, 2022 and August 10, 2020 FINDINGS: Lower chest: No acute abnormality. Hepatobiliary: A 9 mm diameter focus of parenchymal low attenuation is seen within the right lobe of the liver. Status post cholecystectomy. The common bile duct measures 9.5 mm in diameter. Pancreas: Unremarkable. No pancreatic ductal dilatation or surrounding inflammatory changes. Spleen: Normal in size without focal abnormality. Adrenals/Urinary Tract: Adrenal glands are unremarkable. Kidneys are normal in size, without renal calculi or hydronephrosis. Multiple stable bilateral  simple renal cysts are seen. No additional follow-up or imaging is recommended. Bladder is unremarkable. Stomach/Bowel: Stomach is within normal limits. Appendix appears normal. Multiple dilated loops of distal jejunum and proximal to mid ileum are seen within the lower abdomen and pelvis (maximum small bowel diameter of approximately 3.2 cm). A transition zone is seen within the right lower quadrant (coronal reformatted images 51 through 56, CT series 5). A stable 1.0 cm polypoid appearing area of soft tissue attenuation is seen within the lumen of a dilated loop of distal small bowel (axial CT image 57, CT series 2/coronal reformatted image 39, CT series 5). This is seen on image 59, CT series 2 of the most recent prior study and appears to be present on image 59, CT series 2 of the abdomen and pelvis CT dated August 10, 2020. Vascular/Lymphatic: No significant vascular findings are present. No enlarged abdominal or pelvic lymph nodes. Reproductive: Status post hysterectomy. No adnexal masses. Other: No abdominal wall hernia or abnormality. No abdominopelvic ascites. Musculoskeletal: No acute or significant osseous findings. IMPRESSION: 1. Distal partial small bowel obstruction with a transition zone seen within the right lower quadrant. 2. Stable 1.0 cm polypoid appearing area of soft tissue attenuation within the lumen of a loop of distal small bowel. Given its stability, this may be benign in etiology, however, GI consult is recommended. 3. Stable bilateral simple renal cysts. 4. Status post cholecystectomy. 5. Small, stable hepatic cyst or hemangioma. Electronically Signed   By: Aram Candela M.D.   On: 04/27/2022 22:30    Anti-infectives (From admission, onward)    None       Assessment/Plan SBO - CT w/ distal psbo with transition in the RLQ. Hx of prior abdominal hysterectomy and cholecystectomy - She has clinically and radiographically resolved. Xray with non-obstructive bowel gas pattern.  Pain improved, having bowel function, tolerating liquids, exam reassuring.  Diet be advanced as tolerated and she may discharge from our standpoint.  We will sign off.  Please call back with any questions or concerns  FEN -  HH, IVF per [facility] VTE - SCDs, okay for chemical prophylaxis from general surgery standpoint ID - None indicated from our standpoint Dispo - Admit to [facility], we will follow.   Polypoid appearing area of soft tissue attenuation within the lumen of a loop of distal small bowel - agree with [facility] recommendation for GI follow-up as an outpatient HTN HLD CVA OSA CHF Presence of Pacemaker Multiple Myeloma in remission   I reviewed nursing notes, ED provider notes, hospitalist notes, last 24 h vitals and pain scores, last 48 h intake and output, last 24 h labs and trends, and last 24 h imaging results.    Alferd Apa, [facility] 04/29/2022, 9:15 AM Please see Amion for pager number during day hours 7:00am-4:30pm

## 2022-04-28 NOTE — Assessment & Plan Note (Signed)
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

## 2022-04-28 NOTE — ED Notes (Signed)
Ambulatory to bathroom. Gait steady. Clear fluids tolerated well. No signs of distress. No complaints.

## 2022-04-28 NOTE — Assessment & Plan Note (Signed)
-  Stable 1.0 cm polypoid appearing area of soft tissue attenuation within the lumen of a loop of distal small bowel seen on CT A/P - recommend follow up with GI but likely benign

## 2022-04-28 NOTE — Assessment & Plan Note (Signed)
BMI of 33 ?

## 2022-04-28 NOTE — ED Notes (Signed)
States, "Since I have been able to have bowel movements my stomach is feeling so much better. Ambulatory to bathroom with steady gait. VSS. No complaints.

## 2022-04-28 NOTE — ED Notes (Signed)
Patient had a bowel movement at bedside commode with urine. Unable to get a clean urine specimen at this time

## 2022-04-28 NOTE — H&P (Signed)
History and Physical    Patient: Tami [name] WLP:[id number] DOB: 09/24/1956 DOA: 04/27/2022 DOS: the patient was seen and examined on 04/28/2022 PCP: [name], MD  Patient coming from: Home  Chief Complaint:  Chief Complaint  Patient presents with   Abdominal Pain   HPI: Tami [name] is a 66 y.o. 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   Has had 5 months of ongoing diarrhea followed by her PCP that worsened over the past 2 weeks. Having diarrhea 3-5x daily. Then became constipated in the past 2 days. Last night begin to have diffuse abdominal pain, nausea and vomiting but was unable to have bowel movement. Took fleet enema and stool softener and then went to ED.   Distal partial small bowel obstruction within the RL quadrant seen on CT A/P. General surgery was consulted but pt refused NG tube. While awaiting transfer here to [facility] she was able to have 5 bowel movements with improvement in her pain and tolerated liquid diet without nausea or vomiting. She was offered the option to be discharged home by ED physician but choose to continue with admission for observation.    Review of Systems: As mentioned in the history of present illness. All other systems reviewed and are negative. Past Medical History:  Diagnosis Date   Abnormal SPEP 07/26/2016   Arthritis    knees, hands   Back pain 07/14/2016   Bowel obstruction (HCC) 11/2014   C. 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   Multiple myeloma (HCC) 07/20/2018   Multiple myeloma not having achieved remission (HCC) 07/20/2018   Myalgia 12/31/2016   Obstructive sleep apnea 03/31/2015   SOB (shortness of breath) 04/09/2016   Stroke (HCC)    TIAs   UTI (urinary tract infection)    Past Surgical History:  Procedure Laterality Date   ABDOMINAL HYSTERECTOMY     menorraghia, 2006, total   CHOLECYSTECTOMY     COLONOSCOPY  2018   [facility] per patient   ESOPHAGOGASTRODUODENOSCOPY  2018   [facility]    KNEE SURGERY     right, repair torn torn cartialage   PACEMAKER INSERTION  08/2014   TONSILLECTOMY     TUBAL LIGATION     Social History:  reports that she has never smoked. She has never used smokeless tobacco. She reports that she does not drink alcohol and does not use drugs.  Allergies  Allergen Reactions   Benazepril Anaphylaxis, Swelling and Hives    angioedema Throat and lip swelling   Ondansetron Hcl Hives    Redness and hives post IV admin on 07/05/17   Codeine Nausea And Vomiting   Morphine Hives    Redness and hives noted post IV admin on 07/05/17    Family History  Problem Relation Age of Onset   Diabetes Mother    Hypertension Mother    Heart disease Mother  s/p 1 stent   Hyperlipidemia Mother    Arthritis Mother    Cancer Father        COLON   Colon cancer Father 66   Irritable bowel syndrome Sister    Hyperlipidemia Daughter    Hypertension Daughter    Hypertension Maternal Grandmother    Arthritis Maternal Grandmother    Heart disease Maternal Grandfather        MI   Hypertension Maternal Grandfather    Arthritis Maternal Grandfather    Hypertension Son    Esophageal cancer Neg Hx     Prior to Admission medications   Medication Sig Start Date End Date Taking? Authorizing Provider  acetaminophen (TYLENOL) 500 MG tablet Take 500 mg by mouth in the morning.   Yes [provider]  albuterol (VENTOLIN HFA) 108 (90 Base) MCG/ACT inhaler Inhale 2 puffs into the lungs every 6 (six) hours as needed for wheezing or shortness of breath. 11/20/21  Yes Mosie Lukes, MD  aspirin EC 81 MG tablet Take 1 tablet (81 mg total) by mouth daily. 02/15/18  Yes Mosie Lukes, MD  carvedilol (COREG) 25 MG tablet Take 1 tablet (25 mg total) by mouth 2 (two) times daily with a meal. 06/04/20  Yes Mosie Lukes, MD  cetirizine (ZYRTEC) 10 MG tablet Take 10 mg by mouth daily as needed for allergies.   Yes [provider]  Cholecalciferol (VITAMIN D-3) 25 MCG (1000 UT) CAPS Take 1 capsule (1,000 Units total) by mouth daily. 05/03/20  Yes Mosie Lukes, MD  fluticasone (FLONASE) 50 MCG/ACT nasal spray Place 2 sprays into both nostrils daily. Patient taking differently: Place 2 sprays into both nostrils every evening. 01/07/20  Yes Joy, Shawn C, PA-C  furosemide (LASIX) 20 MG tablet Take 1 tablet by mouth daily as needed for fluid. 07/03/20  Yes [provider]  ibuprofen (ADVIL) 800 MG tablet Take 1 tablet (800 mg total) by mouth 3 (three) times daily. Patient taking differently: Take 800 mg by mouth every 8 (eight) hours as needed for moderate pain. 08/25/21  Yes Aberman, Caroline C, PA-C  loratadine (CLARITIN) 10 MG tablet Take 1 tablet (10 mg total) by mouth daily. Patient taking differently: Take 10 mg by mouth daily as needed for allergies. 09/08/21  Yes Shelda Pal, DO  losartan (COZAAR) 50 MG tablet Take 50 mg by mouth daily. 04/14/21  Yes [provider]  magnesium oxide (MAG-OX) 400 MG tablet Take 1 tablet (400 mg total) by mouth 2 (two) times daily. 03/04/20  Yes Mosie Lukes, MD  Multiple Vitamins-Minerals (MULTIVITAMIN ADULT PO) Take 1 tablet by mouth daily.   Yes [provider]  oxyCODONE-acetaminophen (PERCOCET/ROXICET) 5-325 MG tablet Take 1 tablet by mouth every 6 (six) hours as needed for severe pain.   Yes [provider]   Probiotic Product (PROBIOTIC PO) Take 1 capsule by mouth daily.   Yes [provider]  spironolactone (ALDACTONE) 25 MG tablet Take 12.5 mg by mouth daily.   Yes [provider]  celecoxib (CELEBREX) 100 MG capsule Take 1 capsule (100 mg total) by mouth 2 (two) times daily as needed for mild pain or moderate pain (with food). Patient not taking: Reported on 04/28/2022 08/07/21   Mosie Lukes, MD  cholestyramine Lucrezia Starch) 4 g packet Take 1 packet (4 g total) by mouth 2 (two) times daily. Patient not taking: Reported on 04/28/2022 11/20/21   Mosie Lukes, MD  cyclobenzaprine (FLEXERIL) 10 MG  tablet Take 1 tablet (10 mg total) by mouth at bedtime. Patient not taking: Reported on 04/28/2022 02/19/20   Saguier, Percell Miller, PA-C  dicyclomine (BENTYL) 10 MG capsule Take 1 tab every 6 hours as needed for abdominal cramping. Patient not taking: Reported on 04/28/2022 05/28/21   Shelda Pal, DO  diphenoxylate-atropine (LOMOTIL) 2.5-0.025 MG tablet TAKE ONE (1) TABLET BY MOUTH FOUR (4) TIMES DAILY AS NEEDED FOR DIARRHEA OR LOOSE STOOLS Patient not taking: Reported on 04/28/2022 04/03/22   Mosie Lukes, MD  Ferrous Fumarate-Folic Acid 761-9 MG TABS Take 324 mg by mouth daily. Patient not taking: Reported on 04/28/2022 08/29/21   Mosie Lukes, MD  fluticasone furoate-vilanterol (BREO ELLIPTA) 100-25 MCG/ACT AEPB Inhale 1 puff into the lungs daily. Patient not taking: Reported on 04/28/2022 11/20/21   Mosie Lukes, MD  HYDROcodone-acetaminophen (NORCO/VICODIN) 5-325 MG tablet Take 1 tablet by mouth every 6 (six) hours as needed. Patient not taking: Reported on 04/28/2022 08/25/21   Suzy Bouchard, PA-C  hydrOXYzine (ATARAX) 10 MG tablet TAKE ONE (1) TABLET BY MOUTH 3 TIMES DAILY AS NEEDED FOR ITCHING Patient not taking: Reported on 04/28/2022 03/13/22   Saguier, Percell Miller, PA-C  montelukast (SINGULAIR) 10 MG tablet Take 1 tablet (10 mg total) by mouth at bedtime. Patient not  taking: Reported on 04/28/2022 02/14/21   Debbrah Alar, NP  omeprazole (PRILOSEC) 40 MG capsule Take 1 capsule (40 mg total) by mouth in the morning and at bedtime. Patient not taking: Reported on 04/28/2022 07/25/21   Shelda Pal, DO  promethazine (PHENERGAN) 25 MG tablet Take 1 tablet (25 mg total) by mouth every 8 (eight) hours as needed for nausea or vomiting (headache). Patient not taking: Reported on 04/28/2022 11/25/21   Mosie Lukes, MD  sucralfate (CARAFATE) 1 g tablet Take 1 tablet (1 g total) by mouth 3 (three) times daily as needed. prn Patient not taking: Reported on 04/28/2022 07/30/20   Mosie Lukes, MD  topiramate (TOPAMAX) 50 MG tablet Take 1 tablet (50 mg total) by mouth 2 (two) times daily. Patient not taking: Reported on 04/28/2022 07/13/19   Mosie Lukes, MD  triamcinolone cream (KENALOG) 0.1 % Apply 1 application topically 2 (two) times daily. Patient not taking: Reported on 04/28/2022 01/02/22   Shelda Pal, DO  zinc sulfate 220 (50 Zn) MG capsule Take 220 mg by mouth 2 (two) times daily. Patient not taking: Reported on 04/28/2022 06/16/21   [provider]  prochlorperazine (COMPAZINE) 10 MG tablet Take 1 tablet (10 mg total) by mouth every 6 (six) hours as needed (Nausea or vomiting). 09/04/20 12/19/20  [name], MD    Physical Exam: Vitals:   04/28/22 1318 04/28/22 1500 04/28/22 1630 04/28/22 1815  BP: (!) 154/86 (!) 170/102 (!) 151/87 (!) 155/117  Pulse: 82 84 85 91  Resp: $Remo'15 17 16 16  'dFyzI$ Temp: 98.2 F (36.8 C) 98 F (36.7 C)    TempSrc:  Oral    SpO2: 95% 96% 95% 98%  Weight:      Height:       Constitutional: NAD, calm, comfortable, elderly female appearing much younger than stated age laying in bed talking on cell phone Eyes: lids and conjunctivae normal ENMT: Mucous membranes are moist.  Neck: normal, supple, Respiratory: clear to auscultation bilaterally, no wheezing, no crackles. Normal respiratory effort. No  accessory muscle use.  Cardiovascular: Regular rate and rhythm, no murmurs / rubs / gallops. No extremity edema.   Abdomen: Soft,  mildly distended, no tenderness.  Minimal bowel sounds throughout but does have obese abdomen. Musculoskeletal: no clubbing / cyanosis. No joint deformity upper and lower extremities. Good ROM, no contractures. Skin: no rashes, lesions, ulcers. No induration Neurologic: CN 2-12 grossly intact. Strength 5/5 in all 4.  Psychiatric: Normal judgment and insight. Alert and oriented x 3. Normal mood. Data Reviewed:  See HPI   Assessment and Plan: * SBO (small bowel obstruction) ([place]) -CT A/P with distal small bowel obstruction that now appears to be resolving. She was able to have 5 bowel movement and tolerate liquid diet while awaiting transfer here for outside ED. Repeat abdominal X-ray also shows resolution of obstruction. -okay to advance diet to solids overnight. Surgery was consulted in ED but if she remains asymptomatic, can discharge in the morning.    Obesity (BMI 30-39.9) BMI of 33.  Soft tissue lesion -Stable 1.0 cm polypoid appearing area of soft tissue attenuation within the lumen of a loop of distal small bowel seen on CT A/P - recommend follow up with GI but likely benign  Multiple myeloma ([place]) Follows with oncologist Dr. Marin Olp. Currently under observation without treatment.  Congestive heart failure (HCC) Combined systolic and diastolic CHF -s/p BiV ICD with recent device replacement in March 2023 -Follows with cardiology at [facility] [place]   Hypertension Elevated. Has missed 2 days of her medications due to SBO. Will resume all home meds.      Advance Care Planning:   Code Status: Full Code   Consults: General surgery  Family Communication: No family at bedside  Severity of Illness: The appropriate patient status for this patient is INPATIENT. Inpatient status is judged to be reasonable and necessary in order to provide the required  intensity of service to ensure the patient's safety. The patient's presenting symptoms, physical exam findings, and initial radiographic and laboratory data in the context of their chronic comorbidities is felt to place them at high risk for further clinical deterioration. Furthermore, it is not anticipated that the patient will be medically stable for discharge from the hospital within 2 midnights of admission.   * I certify that at the point of admission it is my clinical judgment that the patient will require inpatient hospital care spanning beyond 2 midnights from the point of admission due to high intensity of service, high risk for further deterioration and high frequency of surveillance required.*  Author: Orene Desanctis, DO 04/28/2022 7:50 PM  For on call review [email or web address].

## 2022-04-28 NOTE — Assessment & Plan Note (Addendum)
-  CT A/P with distal small bowel obstruction that now appears to be resolving. She was able to have 5 bowel movement and tolerate liquid diet while awaiting transfer here for outside ED. Repeat abdominal X-ray also shows resolution of obstruction. -okay to advance diet to solids overnight. Surgery was consulted in ED but if she remains asymptomatic, can discharge in the morning.

## 2022-04-28 NOTE — ED Provider Notes (Signed)
Patient states she is feeling better.  She is able to tolerate clear fluids.  She has had a bowel movement now.  Her stomach is improved.  Discussed with the patient the option of being discharged from the ED since she is feeling better now and her x-rays are improved.  Patient is hesitant to be discharged at this time from the ED.  She would like the second opinion of the surgical/ medical team.   Dorie Rank, MD 04/28/22 1549

## 2022-04-28 NOTE — ED Notes (Signed)
[facility] en route to transport pt. ETA 15 minutes.

## 2022-04-28 NOTE — Assessment & Plan Note (Signed)
Follows with oncologist Dr. Marin Olp. Currently under observation without treatment.

## 2022-04-28 NOTE — Assessment & Plan Note (Signed)
Elevated. Has missed 2 days of her medications due to SBO. Will resume all home meds.

## 2022-04-28 NOTE — Progress Notes (Addendum)
Transferring facility: [facility] Requesting provider: Dr. Langston Masker (EDP at [facility]) Reason for transfer: admission for further evaluation and management of small bowel obstruction.   66 year old female who presented to [place] ED on 04/27/2022 complaining of generalized abdominal discomfort over the last 1 to 2 days, associated with no bowel movement over the last 2 days.  This is reportedly in the context of patient experiencing multiple daily episodes of diarrhea over the last 2 months, for which she recently became more aggressive with gastrointestinal antimotility agents, significantly increasing her dose of Lomotil.  Not associate with any fever.  Imaging notable for CT abdomen/pelvis, which reportedly was consistent with partial small bowel obstruction.  EDP and [facility] discussed patient's case/imaging with on-call general surgeon, Dr. Estell Harpin, who recommended admission to the hospitalist service for further evaluation / management of small bowel obstruction, and conveyed that he would formally consult on the patient at [facility].   Of note, the patient requested that she not receive NGT.   Subsequently, I accepted this patient for transfer for inpatient admission to a med telemetry bed at [facility] for further work-up and management of small bowel obstruction.       Check [email or web address] for on-call coverage.   Nursing staff, Please call [place] number on Amion as soon as patient's arrival, so appropriate admitting provider can evaluate the pt.     Babs Bertin, DO Hospitalist

## 2022-04-29 ENCOUNTER — Inpatient Hospital Stay: Payer: Medicare Other

## 2022-04-29 ENCOUNTER — Telehealth: Payer: Self-pay

## 2022-04-29 DIAGNOSIS — R197 Diarrhea, unspecified: Secondary | ICD-10-CM

## 2022-04-29 DIAGNOSIS — K6389 Other specified diseases of intestine: Secondary | ICD-10-CM

## 2022-04-29 DIAGNOSIS — K56609 Unspecified intestinal obstruction, unspecified as to partial versus complete obstruction: Secondary | ICD-10-CM | POA: Diagnosis not present

## 2022-04-29 LAB — BASIC METABOLIC PANEL
Anion gap: 7 (ref 5–15)
BUN: 17 mg/dL (ref 8–23)
CO2: 25 mmol/L (ref 22–32)
Calcium: 8.7 mg/dL — ABNORMAL LOW (ref 8.9–10.3)
Chloride: 107 mmol/L (ref 98–111)
Creatinine, Ser: 0.76 mg/dL (ref 0.44–1.00)
GFR, Estimated: >60 mL/min (ref >60)
Glucose, Bld: 107 mg/dL — ABNORMAL HIGH (ref 70–99)
Potassium: 3.4 mmol/L — ABNORMAL LOW (ref 3.5–5.1)
Sodium: 139 mmol/L (ref 135–145)

## 2022-04-29 LAB — CBC
HCT: 34.5 % — ABNORMAL LOW (ref 36.0–46.0)
Hemoglobin: 11 g/dL — ABNORMAL LOW (ref 12.0–15.0)
MCH: 28.4 pg (ref 26.0–34.0)
MCHC: 31.9 g/dL (ref 30.0–36.0)
MCV: 88.9 fL (ref 80.0–100.0)
Platelets: 105 10*3/uL — ABNORMAL LOW (ref 150–400)
RBC: 3.88 MIL/uL (ref 3.87–5.11)
RDW: 13.9 % (ref 11.5–15.5)
WBC: 3.2 10*3/uL — ABNORMAL LOW (ref 4.0–10.5)
nRBC: 0 % (ref 0.0–0.2)

## 2022-04-29 MED ORDER — ACETAMINOPHEN 325 MG PO TABS
650.0000 mg | ORAL_TABLET | Freq: Four times a day (QID) | ORAL | Status: DC | PRN
  Administered 2022-04-29 – 2022-04-30 (×2): 650 mg via ORAL
  Filled 2022-04-29 (×3): qty 2

## 2022-04-29 MED ORDER — COLESTIPOL HCL 1 G PO TABS
1.0000 g | ORAL_TABLET | Freq: Two times a day (BID) | ORAL | Status: DC
Start: 2022-04-29 — End: 2022-04-29

## 2022-04-29 MED ORDER — SODIUM CHLORIDE 0.9 % IV SOLN: INTRAVENOUS | Status: AC

## 2022-04-29 MED ORDER — COLESTIPOL HCL 1 G PO TABS
2.0000 g | ORAL_TABLET | Freq: Two times a day (BID) | ORAL | Status: DC
Start: 2022-04-29 — End: 2022-04-30
  Administered 2022-04-29 – 2022-04-30 (×3): 2 g via ORAL
  Filled 2022-04-29 (×3): qty 2

## 2022-04-29 MED ORDER — CHOLESTYRAMINE 4 G PO PACK
4.0000 g | PACK | Freq: Two times a day (BID) | ORAL | Status: DC
Start: 2022-04-29 — End: 2022-04-29

## 2022-04-29 NOTE — TOC Initial Note (Signed)
Transition of Care [facility]) - Initial/Assessment Note    Patient Details  Name: Tami [name] MRN: [id number] Date of Birth: 10-03-1956  Transition [facility] CM/SW Contact:    Leeroy Cha, RN Phone Number: 04/29/2022, 8:35 AM  Clinical Narrative:                  Transition of Care [facility]) Screening Note   Patient Details  Name: Tami [name] Date of Birth: 28-Jul-1956   Transition of Care [facility]) CM/SW Contact:    Leeroy Cha, RN Phone Number: 04/29/2022, 8:35 AM    Transition of Care Department [facility]) has reviewed patient and no TOC needs have been identified at this time. We will continue to monitor patient advancement through interdisciplinary progression rounds. If new patient transition needs arise, please place a TOC consult.    Expected Discharge Plan: Home/Self Care Barriers to Discharge: Continued Medical Work up   Patient Goals and CMS Choice Patient states their goals for this hospitalization and ongoing recovery are:: to go home CMS Medicare.gov Compare Post Acute Care list provided to:: Patient    Expected Discharge Plan and Services Expected Discharge Plan: Home/Self Care   Discharge Planning Services: CM Consult   Living arrangements for the past 2 months: Single Family Home                                      Prior Living Arrangements/Services Living arrangements for the past 2 months: Single Family Home Lives with:: Self Patient language and need for interpreter reviewed:: Yes Do you feel safe going back to the place where you live?: Yes            Criminal Activity/Legal Involvement Pertinent to Current Situation/Hospitalization: No - Comment as needed  Activities of Daily Living Home Assistive Devices/Equipment: None ADL Screening (condition at time of admission) Patient's cognitive ability adequate to safely complete daily activities?: Yes Is the patient deaf or have difficulty hearing?: No Does the patient have  difficulty seeing, even when wearing glasses/contacts?: No Does the patient have difficulty concentrating, remembering, or making decisions?: No Patient able to express need for assistance with ADLs?: No Does the patient have difficulty dressing or bathing?: No Independently performs ADLs?: Yes (appropriate for developmental age) Does the patient have difficulty walking or climbing stairs?: No Weakness of Legs: None Weakness of Arms/Hands: None  Permission Sought/Granted                  Emotional Assessment Appearance:: Appears stated age     Orientation: : Oriented to Place, Oriented to Self, Oriented to  Time, Oriented to Situation Alcohol / Substance Use: Not Applicable Psych Involvement: No (comment)  Admission diagnosis:  SBO (small bowel obstruction) (HCC) [K56.609] Partial small bowel obstruction ([place]) [K56.600] Patient Active Problem List   Diagnosis Date Noted   Soft tissue lesion 04/28/2022   Obesity (BMI 30-39.9) 04/28/2022   SBO (small bowel obstruction) ([place]) 04/27/2022   Dehydration 04/03/2022   Acute vaginitis 03/18/2022   Cervical cancer screening 03/18/2022   Dizziness 12/11/2021   Visual changes 11/26/2021   Ear fullness, bilateral 08/12/2021   Bilateral knee pain 08/08/2021   History of 2019 novel coronavirus disease (COVID-19) 12/06/2020   Low vitamin D level 07/31/2020   Acute combined systolic and diastolic heart failure ([place]) 05/09/2020   Low magnesium level 03/04/2020   Thrombocytopenia ([place]) 03/04/2020   Dysphagia  03/04/2020   Constipation 03/04/2020   Sensation of pressure in bladder area 01/17/2020   Hyperglycemia 01/17/2020   Peripheral neuropathy 11/15/2019   Chronic migraine without aura, with intractable migraine, so stated, with status migrainosus 10/17/2019   ETD (Eustachian tube dysfunction), bilateral 09/12/2019   Urinary frequency 07/17/2019   Headache 07/17/2019   Palpitation 05/15/2019   Diarrhea 09/04/2018   Thyroid nodule  08/26/2018   Multiple myeloma ([place]) 07/20/2018   Multiple myeloma not having achieved remission ([place]) 07/20/2018   Hematuria 07/11/2018   Seasonal allergies 06/23/2018   Right shoulder pain 06/07/2018   Neck pain 06/07/2018   Paresthesias 06/07/2018   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   Pain in thoracic spine 03/18/2018   Thoracic back pain 02/15/2018   Hypogammaglobulinemia ([place]) 12/07/2017   Hypokalemia 08/20/2017   Anemia 08/20/2017   Elevated sed rate 08/15/2017   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   Epigastric pain 03/22/2017   Myalgia 12/31/2016   SOB (shortness of breath) 07/26/2016   Abnormal SPEP 07/26/2016   Low back pain 07/14/2016   Sore throat 04/19/2016   Insomnia 12/29/2015   Tension headache 10/21/2015   Muscle spasms of neck 10/18/2015   Dysuria 10/13/2015   Sinusitis 07/07/2015   AP (abdominal pain) 07/07/2015   Cardiomyopathy ([place]) 04/19/2015   Chest wall pain 04/19/2015   Obstructive sleep apnea 03/31/2015   Congestive heart failure ([place]) 02/24/2015   Arthritis    Hyperlipidemia    Hypertension    Depression    Stroke [facility])    GERD (gastroesophageal reflux disease)    Bowel obstruction ([place]) 11/09/2014   PCP:  Mosie Lukes, MD Pharmacy:   [place], [place] - [address] [place] [address] Phone: [phone] Fax: [phone]  [facility],  - [address] [place] [address] Phone: [phone] Fax: [phone]     Social Determinants of Health (SDOH) Interventions    Readmission Risk Interventions     No data to display

## 2022-04-29 NOTE — Telephone Encounter (Signed)
-----   Message from Levin Erp, [place] sent at 04/29/2022 10:46 AM EDT ----- Regarding: follow up with gupta This patient needs follow up with dr Lyndel Safe in next 3-4 weeks for chronic diarrhea, thanks-JLL

## 2022-04-29 NOTE — Progress Notes (Signed)
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    Has had 5 months of ongoing diarrhea followed by her PCP that worsened over the past 2 weeks. Having diarrhea 3-5x daily. Then became constipated in the past 2 days. Last night begin to have diffuse abdominal pain, nausea and vomiting but was unable to have bowel movement. Took fleet enema and stool softener and then went to ED.   Subjective:  Reports intermittent diarrhea, diffuse ab pain/epigastric pain for the last 71months, she does not feel she can go home today, no appetite, feels dehydrated, want to have iv hydration She got dehydrated from diarrhea in the past, feeling weak, she received hydration from oncology clinic , then felt better  No weight loss Still having stomach pain, diarrhea Last n/v was on monday  Assessment & Plan:  Principal Problem:   SBO (small bowel obstruction) (HCC) Active Problems:   Obesity (BMI 30-39.9)   Hypertension   Congestive heart failure (HCC)   Multiple myeloma (HCC)   Soft tissue lesion    Assessment and Plan:  * SBO (small bowel obstruction) (HCC) -CT A/P with distal small bowel obstruction that now appears to be resolving. She was able to have 5 bowel movement and tolerate liquid diet while awaiting transfer here for outside ED. Repeat abdominal X-ray also shows resolution of obstruction. -seen by gen surg, nothing to do since sbo has resolved.  Ab pain/ diarrhea -h/o cholecystectomy  -appears has been intermittent since 11/2021 -reports feeling dehydrated. Wants hydration -request to see GI, GI consulted   Soft tissue lesion: Stable 1.0 cm polypoid appearing area of soft tissue attenuation within the lumen of a loop of distal  small bowel. Given its stability, this may be benign in etiology, however, GI consult is recommended.  Obesity (BMI 30-39.9) BMI of 33.   Multiple myeloma ([place]) Follows with oncologist Dr. Marin Olp. Currently under observation without treatment.  Congestive heart failure (HCC) Combined systolic and diastolic CHF -s/p BiV ICD with recent device replacement in March 2023 -Follows with cardiology at [facility]  -appear dehydrated, gentle hydration   Hypertension Elevated. Has missed 2 days of her medications due to SBO. Will resume all home meds.   Body mass index is 33.28 kg/m., meet obesity criteria      I have Reviewed nursing notes, Vitals, pain scores, I/o's, Lab results and  imaging results since pt's last encounter, details please see discussion above  I ordered the following labs:  Unresulted Labs (From admission, onward)     Start     Ordered   04/30/22 0500  CBC with Differential/Platelet  Tomorrow morning,   R        04/29/22 1606   04/30/22 9562  Basic metabolic panel  Tomorrow morning,   R        04/29/22 1606   04/30/22 0500  Magnesium  Tomorrow morning,   R        04/29/22 1606   04/27/22 2038  Urinalysis, Routine w reflex microscopic  Once,   R        04/27/22 2037             DVT prophylaxis: enoxaparin (LOVENOX) injection 40 mg Start: 04/28/22 2200  Code Status:   Code Status: Full Code  Family Communication: patient Disposition:    Dispo: The patient is from: Home lives alone              Anticipated d/c is to: Home              Anticipated d/c date is: 6/22  Antimicrobials:    Anti-infectives (From admission, onward)    None          Objective: Vitals:   04/28/22 1815 04/29/22 0031 04/29/22 0459 04/29/22 1338  BP: (!) 155/117 121/86 111/70 106/77  Pulse: 91 90 78 79  Resp: $Remo'16 20 18 16  'mIIXq$ Temp:  97.7 F (36.5 C) 98.2 F (36.8 C) 98.3 F (36.8 C)  TempSrc:  Oral  Oral  SpO2: 98% 97% 99% 96%  Weight:      Height:         Intake/Output Summary (Last 24 hours) at 04/29/2022 1914 Last data filed at 04/29/2022 1842 Gross per 24 hour  Intake 1048.8 ml  Output --  Net 1048.8 ml   Filed Weights   04/27/22 2035  Weight: 90.7 kg    Examination:  General exam: alert, awake, communicative,calm, NAD Respiratory system: Clear to auscultation. Respiratory effort normal. Cardiovascular system:  RRR.  Gastrointestinal system: mild diffuse tender, no guarding, no rebound,.  Normal bowel sounds heard. Central nervous system: Alert and oriented. No focal neurological deficits. Extremities:  no edema Skin: No rashes, lesions or ulcers Psychiatry: Judgement and insight appear normal. Mood & affect appropriate.     Data Reviewed: I have personally reviewed  labs and visualized  imaging studies since the last encounter and formulate the plan        Scheduled Meds:  aspirin EC  81 mg Oral Daily   carvedilol  25 mg Oral BID WC   colestipol  2 g Oral Q12H   enoxaparin (LOVENOX) injection  40 mg Subcutaneous Q24H   losartan  50 mg Oral Daily   spironolactone  12.5 mg Oral Daily   Continuous Infusions:  sodium chloride 75 mL/hr at 04/29/22 0948     LOS: 1 day      Florencia Reasons, MD PhD FACP [facility]  Available via Epic secure chat 7am-7pm for nonurgent issues Please page for urgent issues To page the attending provider between 7A-7P or the covering provider during after hours 7P-7A, please log into the web site [email or web address] and access using universal [place] password for that web site. If you do not have the password, please call the hospital operator.    04/29/2022, 7:14 PM

## 2022-04-29 NOTE — Consult Note (Addendum)
Consultation  Referring Provider:     Dr. Erlinda Hong Primary Care Physician:  Mosie Lukes, MD Primary Gastroenterologist: Carmell Austria, MD      Reason for Consultation: Nausea, vomiting, abdominal pain and diarrhea            HPI:   Tami [name] is a 66 y.o. 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    At time of admission patient described that she had 5 months of ongoing diarrhea that had worsened over the past 2 weeks with diarrhea 3-5 times a day, apparently saw PCP and had negative C. difficile testing as well as negative CT, had been trying antidiarrheals and then became constipated for 2 days and started with diffuse abdominal pain with nausea and vomiting was unable to have a bowel movement at all.  Apparently took a fleets enema and stool softener and then went to the ED.  CT was done with distal partial small bowel obstruction within the right lower quadrant, general surgery was consulted but patient refused NG tube.  While awaiting her transfer to [facility] she was able to have 5 bowel movements with improvement in her pain and tolerated liquid diet without nausea or vomiting.  Repeat abdominal x-ray with resolution of bowel obstruction.    Today, the patient tells me that really all of her presenting problems are resolved.  She remains with some lingering generalized abdominal discomfort but feels like this is just because she was stopped up for a while.  She is having her regular diarrheal bowel movements now, just 2 yesterday but 5 so far this morning.  Tells me that she has been working with her PCP and also Dr. Lyndel Safe since January to find resolution for this but nobody has been able to fix it.  She was started on  Cholestyramine twice daily which she has been taking for the past month and does not really feel like it was working that well.  She was then started on what sounds like Imodium by her PCP about a week ago which she was taking 4 times a day, but then she ended up here with bowel obstruction.  She is confused as to what to do when she goes home.  Tells me that the diarrhea really drains her and she has to go get IV fluids every couple of weeks.  Also tells me everyone has been trying to make her take magnesium but then they tell her it gives her diarrhea so they stopped it but then her cardiologist puts it back on.  She is not sure what to do.   Denies fever, chills, weight loss or blood in her stool     ER workup: 04/27/2022 CT then pelvis with distal partial small bowel obstruction with a transition zone in the right lower quadrant, stable 1.0 cm polypoid appearing area of soft tissue attenuation within the lumen of the loop distal small bowel, given stability likely benign, status postcholecystectomy, stable bilateral simple renal cyst and small stable hepatic cyst or hemangioma  GI history: 12/08/2021 stool testing negative 12/05/2021 office visit with Dr. Lyndel Safe: Discuss functional dyspepsia and IBS-D with previous history of constipation, had a negative colon with TI intubation and random colon biopsies were negative, negative stool studies and multiple  CTs, thought possibly due to bile acid diarrhea (status postcholecystectomy) or due to other meds; plan: At that time continued Protonix 40 mg twice daily, ordered CBC, CMP and magnesium and discussed it was reasonable to repeat solid-phase GES, continue Cholestyramine 4 g p.o. daily, stool studies ordered, was also discussed she need to continue to follow with Dr. Dorrene German who knew her well rather than having multiple gastroenterologist  Past Medical History:  Diagnosis Date   Abnormal SPEP 07/26/2016   Arthritis    knees, hands   Back pain 07/14/2016    Bowel obstruction ([place]) 11/2014   C. 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[facility]) 09/16/2016   Multiple myeloma ([place]) 07/20/2018   Multiple myeloma not having achieved remission ([place]) 07/20/2018   Myalgia 12/31/2016   Obstructive sleep apnea 03/31/2015   SOB (shortness of breath) 04/09/2016   Stroke ([place])    TIAs   UTI (urinary tract infection)     Past Surgical History:  Procedure Laterality Date   ABDOMINAL HYSTERECTOMY     menorraghia, 2006, total   CHOLECYSTECTOMY     COLONOSCOPY  2018   [facility] per patient   ESOPHAGOGASTRODUODENOSCOPY  2018   [facility]    KNEE SURGERY     right, repair torn torn cartialage   PACEMAKER INSERTION  08/2014   TONSILLECTOMY     TUBAL LIGATION      Family History  Problem Relation Age of Onset   Diabetes Mother    Hypertension Mother    Heart disease Mother        s/p 1 stent   Hyperlipidemia Mother    Arthritis Mother    Cancer Father        COLON   Colon cancer Father 100   Irritable bowel syndrome Sister    Hyperlipidemia Daughter    Hypertension Daughter    Hypertension Maternal Grandmother    Arthritis Maternal Grandmother    Heart disease Maternal Grandfather        MI   Hypertension Maternal Grandfather    Arthritis Maternal Grandfather    Hypertension Son    Esophageal cancer Neg Hx     Social History   Tobacco Use   Smoking status: Never   Smokeless tobacco: Never  Vaping Use   Vaping Use: Never used  Substance Use Topics   Alcohol use: No   Drug use: No    Prior to Admission medications   Medication Sig Start Date  End Date Taking? Authorizing Provider  acetaminophen (TYLENOL) 500 MG tablet Take 500 mg by mouth in the morning.   Yes [provider]  albuterol (VENTOLIN HFA) 108 (90 Base) MCG/ACT inhaler Inhale 2 puffs into the lungs every 6 (six) hours as needed for wheezing or shortness of breath. 11/20/21  Yes Mosie Lukes, MD  aspirin EC 81 MG tablet Take 1 tablet (81 mg total) by mouth daily. 02/15/18  Yes Mosie Lukes, MD  carvedilol (COREG) 25 MG tablet Take 1 tablet (25 mg total) by mouth 2 (two) times daily with a meal. 06/04/20  Yes Mosie Lukes, MD  cetirizine (ZYRTEC) 10 MG tablet Take 10 mg by mouth daily as needed for allergies.   Yes [provider]  Cholecalciferol (VITAMIN D-3) 25 MCG (1000 UT) CAPS Take 1 capsule (1,000 Units total) by mouth daily. 05/03/20  Yes Mosie Lukes, MD  fluticasone (FLONASE) 50 MCG/ACT nasal spray Place 2 sprays into both nostrils daily. Patient taking differently: Place 2 sprays into both nostrils every evening. 01/07/20  Yes Joy, Shawn C, PA-C  furosemide (LASIX) 20 MG tablet Take 1 tablet by mouth daily as needed for fluid. 07/03/20  Yes [provider]  ibuprofen (ADVIL) 800 MG tablet Take 1 tablet (800 mg total) by mouth 3 (three) times daily. Patient taking differently: Take 800 mg by mouth every 8 (eight) hours as needed for moderate pain. 08/25/21  Yes Aberman, Caroline C, PA-C  loratadine (CLARITIN) 10 MG tablet Take 1 tablet (10 mg total) by mouth daily. Patient taking differently: Take 10 mg by mouth daily as needed for allergies. 09/08/21  Yes Shelda Pal, DO  losartan (COZAAR) 50 MG tablet Take 50 mg by mouth daily. 04/14/21  Yes [provider]  magnesium oxide (MAG-OX) 400 MG tablet Take 1 tablet (400 mg total) by mouth 2 (two) times daily. 03/04/20  Yes Mosie Lukes, MD  Multiple Vitamins-Minerals (MULTIVITAMIN ADULT PO) Take 1 tablet by mouth daily.   Yes [provider]   oxyCODONE-acetaminophen (PERCOCET/ROXICET) 5-325 MG tablet Take 1 tablet by mouth every 6 (six) hours as needed for severe pain.   Yes [provider]  Probiotic Product (PROBIOTIC PO) Take 1 capsule by mouth daily.   Yes [provider]  spironolactone (ALDACTONE) 25 MG tablet Take 12.5 mg by mouth daily.   Yes [provider]  celecoxib (CELEBREX) 100 MG capsule Take 1 capsule (100 mg total) by mouth 2 (two) times daily as needed for mild pain or moderate pain (with food). Patient not taking: Reported on 04/28/2022 08/07/21   Mosie Lukes, MD  cholestyramine Lucrezia Starch) 4 g packet Take 1 packet (4 g total) by mouth 2 (two) times daily. Patient not taking: Reported on 04/28/2022 11/20/21   Mosie Lukes, MD  cyclobenzaprine (FLEXERIL) 10 MG tablet Take 1 tablet (10 mg total) by mouth at bedtime. Patient not taking: Reported on 04/28/2022 02/19/20   Saguier, Percell Miller, PA-C  dicyclomine (BENTYL) 10 MG capsule Take 1 tab every 6 hours as needed for abdominal cramping. Patient not taking: Reported on 04/28/2022 05/28/21   Shelda Pal, DO  diphenoxylate-atropine (LOMOTIL) 2.5-0.025 MG tablet TAKE ONE (1) TABLET BY MOUTH FOUR (4) TIMES DAILY AS NEEDED FOR DIARRHEA OR LOOSE STOOLS Patient not taking: Reported on 04/28/2022 04/03/22   Mosie Lukes, MD  Ferrous Fumarate-Folic Acid 789-3 MG TABS Take 324 mg by mouth daily. Patient not taking: Reported on 04/28/2022 08/29/21   Mosie Lukes, MD  fluticasone furoate-vilanterol (BREO ELLIPTA) 100-25 MCG/ACT AEPB Inhale 1 puff into the lungs daily. Patient not taking: Reported on 04/28/2022 11/20/21   Mosie Lukes, MD  HYDROcodone-acetaminophen (NORCO/VICODIN) 5-325 MG tablet Take 1 tablet by mouth every 6 (six) hours as needed. Patient not taking: Reported on 04/28/2022 08/25/21   Suzy Bouchard, PA-C  hydrOXYzine (ATARAX) 10 MG tablet TAKE ONE (1) TABLET BY MOUTH 3 TIMES DAILY AS NEEDED FOR ITCHING Patient not  taking: Reported on 04/28/2022 03/13/22   Saguier, Percell Miller, PA-C  montelukast (SINGULAIR) 10 MG tablet Take 1 tablet (10 mg  total) by mouth at bedtime. Patient not taking: Reported on 04/28/2022 02/14/21   Debbrah Alar, NP  omeprazole (PRILOSEC) 40 MG capsule Take 1 capsule (40 mg total) by mouth in the morning and at bedtime. Patient not taking: Reported on 04/28/2022 07/25/21   Shelda Pal, DO  promethazine (PHENERGAN) 25 MG tablet Take 1 tablet (25 mg total) by mouth every 8 (eight) hours as needed for nausea or vomiting (headache). Patient not taking: Reported on 04/28/2022 11/25/21   Mosie Lukes, MD  sucralfate (CARAFATE) 1 g tablet Take 1 tablet (1 g total) by mouth 3 (three) times daily as needed. prn Patient not taking: Reported on 04/28/2022 07/30/20   Mosie Lukes, MD  topiramate (TOPAMAX) 50 MG tablet Take 1 tablet (50 mg total) by mouth 2 (two) times daily. Patient not taking: Reported on 04/28/2022 07/13/19   Mosie Lukes, MD  triamcinolone cream (KENALOG) 0.1 % Apply 1 application topically 2 (two) times daily. Patient not taking: Reported on 04/28/2022 01/02/22   Shelda Pal, DO  zinc sulfate 220 (50 Zn) MG capsule Take 220 mg by mouth 2 (two) times daily. Patient not taking: Reported on 04/28/2022 06/16/21   [provider]  prochlorperazine (COMPAZINE) 10 MG tablet Take 1 tablet (10 mg total) by mouth every 6 (six) hours as needed (Nausea or vomiting). 09/04/20 12/19/20  [name], MD    Current Facility-Administered Medications  Medication Dose Route Frequency Provider Last Rate Last Admin   0.9 %  sodium chloride infusion   Intravenous Continuous Florencia Reasons, MD 75 mL/hr at 04/29/22 0948 New Bag at 04/29/22 0948   aspirin EC tablet 81 mg  81 mg Oral Daily Tu, Ching T, DO   81 mg at 04/29/22 0946   carvedilol (COREG) tablet 25 mg  25 mg Oral BID WC Tu, Ching T, DO   25 mg at 04/29/22 0946   enoxaparin (LOVENOX) injection 40 mg  40 mg  Subcutaneous Q24H Tu, Ching T, DO   40 mg at 04/28/22 2101   losartan (COZAAR) tablet 50 mg  50 mg Oral Daily Tu, Ching T, DO   50 mg at 04/29/22 5732   spironolactone (ALDACTONE) tablet 12.5 mg  12.5 mg Oral Daily Tu, Ching T, DO   12.5 mg at 04/29/22 2025    Allergies as of 04/27/2022 - Review Complete 04/27/2022  Allergen Reaction Noted   Benazepril Anaphylaxis, Swelling, and Hives 09/15/2016   Ondansetron hcl Hives 07/05/2017   Codeine Nausea And Vomiting 02/21/2015   Morphine Hives 07/05/2017     Review of Systems:    Constitutional: No weight loss, fever or chills Skin: No rash  Cardiovascular: No chest pain Respiratory: No SOB  Gastrointestinal: See HPI and otherwise negative Genitourinary: No dysuria  Neurological: No headache, dizziness or syncope Musculoskeletal: No new muscle or joint pain Hematologic: No bleeding  Psychiatric: No history of depression or anxiety    Physical Exam:  Vital signs in last 24 hours: Temp:  [97.7 F (36.5 C)-98.2 F (36.8 C)] 98.2 F (36.8 C) (06/21 0459) Pulse Rate:  [78-91] 78 (06/21 0459) Resp:  [15-20] 18 (06/21 0459) BP: (111-170)/(70-117) 111/70 (06/21 0459) SpO2:  [92 %-99 %] 99 % (06/21 0459) Last BM Date : 04/28/22 General:   Pleasant AA female appears to be in NAD, Well developed, Well nourished, alert and cooperative Head:  Normocephalic and atraumatic. Eyes:   PEERL, EOMI. No icterus. Conjunctiva pink. Ears:  Normal auditory acuity. Neck:  Supple  Throat: Oral cavity and pharynx without inflammation, swelling or lesion. Teeth in good condition. Lungs: Respirations even and unlabored. Lungs clear to auscultation bilaterally.   No wheezes, crackles, or rhonchi.  Heart: Normal S1, S2. No MRG. Regular rate and rhythm. No peripheral edema, cyanosis or pallor.  Abdomen:  Soft, nondistended, mild generalized ttp. No rebound or guarding. Normal bowel sounds. No appreciable masses or hepatomegaly. Rectal:  Not performed.  Msk:   Symmetrical without gross deformities. Peripheral pulses intact.  Extremities:  Without edema, no deformity or joint abnormality. Normal ROM, normal sensation. Neurologic:  Alert and  oriented x4;  grossly normal neurologically. Skin:   Dry and intact without significant lesions or rashes. Psychiatric: Demonstrates good judgement and reason without abnormal affect or behaviors.   LAB RESULTS: Recent Labs    04/27/22 2046 04/29/22 0408  WBC 4.1 3.2*  HGB 12.8 11.0*  HCT 39.5 34.5*  PLT 145* 105*   BMET Recent Labs    04/27/22 2046 04/29/22 0408  NA 136 139  K 3.9 3.4*  CL 105 107  CO2 23 25  GLUCOSE 150* 107*  BUN 16 17  CREATININE 0.69 0.76  CALCIUM 9.3 8.7*   LFT Recent Labs    04/27/22 2046  PROT 9.2*  ALBUMIN 3.6  AST 32  ALT 21  ALKPHOS 55  BILITOT 0.6     STUDIES: DG Abd Portable 1V  Result Date: 04/28/2022 CLINICAL DATA:  Small bowel obstruction. EXAM: PORTABLE ABDOMEN - 1 VIEW COMPARISON:  None Available. FINDINGS: The bowel gas pattern is normal. No radio-opaque calculi or other significant radiographic abnormality are seen. IMPRESSION: Negative. Electronically Signed   By: Marijo Conception M.D.   On: 04/28/2022 12:52   CT ABDOMEN PELVIS W CONTRAST  Result Date: 04/27/2022 CLINICAL DATA:  Epigastric pain and nausea. EXAM: CT ABDOMEN AND PELVIS WITH CONTRAST TECHNIQUE: Multidetector CT imaging of the abdomen and pelvis was performed using the standard protocol following bolus administration of intravenous contrast. RADIATION DOSE REDUCTION: This exam was performed according to the departmental dose-optimization program which includes automated exposure control, adjustment of the mA and/or kV according to patient size and/or use of iterative reconstruction technique. CONTRAST:  179mL OMNIPAQUE IOHEXOL 300 MG/ML  SOLN COMPARISON:  April 13, 2022 and August 10, 2020 FINDINGS: Lower chest: No acute abnormality. Hepatobiliary: A 9 mm diameter focus of parenchymal low  attenuation is seen within the right lobe of the liver. Status post cholecystectomy. The common bile duct measures 9.5 mm in diameter. Pancreas: Unremarkable. No pancreatic ductal dilatation or surrounding inflammatory changes. Spleen: Normal in size without focal abnormality. Adrenals/Urinary Tract: Adrenal glands are unremarkable. Kidneys are normal in size, without renal calculi or hydronephrosis. Multiple stable bilateral simple renal cysts are seen. No additional follow-up or imaging is recommended. Bladder is unremarkable. Stomach/Bowel: Stomach is within normal limits. Appendix appears normal. Multiple dilated loops of distal jejunum and proximal to mid ileum are seen within the lower abdomen and pelvis (maximum small bowel diameter of approximately 3.2 cm). A transition zone is seen within the right lower quadrant (coronal reformatted images 51 through 56, CT series 5). A stable 1.0 cm polypoid appearing area of soft tissue attenuation is seen within the lumen of a dilated loop of distal small bowel (axial CT image 57, CT series 2/coronal reformatted image 39, CT series 5). This is seen on image 59, CT series 2 of the most recent prior study and appears to be present on image 59, CT series 2 of  the abdomen and pelvis CT dated August 10, 2020. Vascular/Lymphatic: No significant vascular findings are present. No enlarged abdominal or pelvic lymph nodes. Reproductive: Status post hysterectomy. No adnexal masses. Other: No abdominal wall hernia or abnormality. No abdominopelvic ascites. Musculoskeletal: No acute or significant osseous findings. IMPRESSION: 1. Distal partial small bowel obstruction with a transition zone seen within the right lower quadrant. 2. Stable 1.0 cm polypoid appearing area of soft tissue attenuation within the lumen of a loop of distal small bowel. Given its stability, this may be benign in etiology, however, GI consult is recommended. 3. Stable bilateral simple renal cysts. 4. Status  post cholecystectomy. 5. Small, stable hepatic cyst or hemangioma. Electronically Signed   By: Virgina Norfolk M.D.   On: 04/27/2022 22:30      Impression / Plan:   Impression: 1.  Small bowel obstruction: As seen on CT at time of admission, though patient symptoms have resolved since then, repeat x-ray with no further obstruction 2.  Chronic diarrhea: Since at least December/January, colonoscopy in that timeframe with Dr. Dorrene German was normal, stool studies have been negative, Dr. Lyndel Safe suspected possibly bile salt induced versus IBS 2.  Abnormal CT of the abdomen: With stable 1 cm polypoid appearing area of soft tissue attenuation within the lumen of the loop of distal small bowel seen on CT, likely benign 3.  Multiple myeloma: Currently following with Dr. Marin Olp, under observation without treatment 4.  CHF: Follows with cardiology at [facility], recent ICD replacement in March 2023 5.  Generalized abdominal pain: Decreasing in intensity since bowel obstruction has resolved, likely due to bowel obstruction +/- IBS  Plan: 1.  Discussed patient's chronic diarrhea with her, apparently this is requiring her to get fluids every couple of weeks at the cancer center due to feeling weak and dehydrated.  She was no better after using Cholestyramine twice daily for a month and then had a bowel obstruction when using Imodium.  At this time we will try Colestid 2 g twice daily with titration upwards as needed to see if this works better for her.  Explained that we will need to follow-up with her in clinic closely over the next few months to try to get this controlled for her but we are likely not going to be able to fix her chronic diarrhea while she is in the hospital.  She verbalized understanding. 2.  Patient's Magnesium supplementation needs to be addressed, certainly this can give her diarrhea, but if required by cardiology then this will need to be discussed further with her. 3.  Dr. Fuller Plan to review  recent CT with small polypoid appearing area of soft tissue and distal small bowel, could possibly get CT enterography for further evaluation vs just following with imaging q6-12 mos to ensure it is not changing 4.  Remain on regular diet today 5.  Follow with [place] in clinic in the next few weeks, scheduled with Dr. Lyndel Safe.  Thank you for your kind consultation.  Lavone Nian Lemmon  04/29/2022, 10:00 AM    Attending Physician Note   I have taken a history, reviewed the chart and examined the patient. I performed a substantive portion of this encounter, including complete performance of at least one of the key components, in conjunction with the APP. I agree with the APP's note, impression and recommendations with my edits. My additional impressions and recommendations are as follows.   *SBO, resolved, due to adhesions. Soft diet and advance as tolerated.   *Distal SB  polypoid lesion. Consider CT enterography as outpatient to allow further recovery from SBO. With her history of SBO she is not an optimal candidate for VCE. Defer work up decisions to Dr. Lyndel Safe as outpatient  *Chronic diarrhea, etiology unclear. Discontinue PO Mg or consider a different PO Mg formulation if it needs to be continued. Start Colestipol 2 g po bid. If not effective add, resume low dose Imodium as SBO was due to adhesions, not Imodium.   No additional inpatient GI evaluation is planned. Further GI evaluation, mgmt as outpatient with Dr. Lyndel Safe.    Lucio Edward, MD [facility] See [facility], [facility], for our on call provider

## 2022-04-29 NOTE — Telephone Encounter (Signed)
Patient has been scheduled for a follow up appointment with Dr. Lyndel Safe on Thursday, 05/28/22 at 10:40 am.   Anderson Malta has been made aware of appt so that it can be added to patient's discharge summary. Letter mailed to patient with appointment information.

## 2022-04-30 DIAGNOSIS — R933 Abnormal findings on diagnostic imaging of other parts of digestive tract: Secondary | ICD-10-CM

## 2022-04-30 LAB — CBC WITH DIFFERENTIAL/PLATELET
Abs Immature Granulocytes: 0 10*3/uL (ref 0.00–0.07)
Basophils Absolute: 0 10*3/uL (ref 0.0–0.1)
Basophils Relative: 1 %
Eosinophils Absolute: 0.1 10*3/uL (ref 0.0–0.5)
Eosinophils Relative: 4 %
HCT: 32.4 % — ABNORMAL LOW (ref 36.0–46.0)
Hemoglobin: 10.2 g/dL — ABNORMAL LOW (ref 12.0–15.0)
Immature Granulocytes: 0 %
Lymphocytes Relative: 55 %
Lymphs Abs: 1.1 10*3/uL (ref 0.7–4.0)
MCH: 28.3 pg (ref 26.0–34.0)
MCHC: 31.5 g/dL (ref 30.0–36.0)
MCV: 89.8 fL (ref 80.0–100.0)
Monocytes Absolute: 0.2 10*3/uL (ref 0.1–1.0)
Monocytes Relative: 11 %
Neutro Abs: 0.6 10*3/uL — ABNORMAL LOW (ref 1.7–7.7)
Neutrophils Relative %: 29 %
Platelets: 98 10*3/uL — ABNORMAL LOW (ref 150–400)
RBC: 3.61 MIL/uL — ABNORMAL LOW (ref 3.87–5.11)
RDW: 13.6 % (ref 11.5–15.5)
WBC: 2 10*3/uL — ABNORMAL LOW (ref 4.0–10.5)
nRBC: 0 % (ref 0.0–0.2)

## 2022-04-30 LAB — BASIC METABOLIC PANEL
Anion gap: 4 — ABNORMAL LOW (ref 5–15)
BUN: 15 mg/dL (ref 8–23)
CO2: 25 mmol/L (ref 22–32)
Calcium: 8.7 mg/dL — ABNORMAL LOW (ref 8.9–10.3)
Chloride: 113 mmol/L — ABNORMAL HIGH (ref 98–111)
Creatinine, Ser: 0.51 mg/dL (ref 0.44–1.00)
GFR, Estimated: >60 mL/min (ref >60)
Glucose, Bld: 94 mg/dL (ref 70–99)
Potassium: 3.3 mmol/L — ABNORMAL LOW (ref 3.5–5.1)
Sodium: 142 mmol/L (ref 135–145)

## 2022-04-30 LAB — MAGNESIUM: Magnesium: 1.9 mg/dL (ref 1.7–2.4)

## 2022-04-30 MED ORDER — POTASSIUM CHLORIDE CRYS ER 20 MEQ PO TBCR
40.0000 meq | EXTENDED_RELEASE_TABLET | Freq: Once | ORAL | Status: AC
  Administered 2022-04-30: 40 meq via ORAL
  Filled 2022-04-30: qty 2

## 2022-04-30 MED ORDER — MAGNESIUM OXIDE 400 MG PO TABS: 400.0000 mg | ORAL_TABLET | Freq: Two times a day (BID) | ORAL | 1 refills | Status: DC

## 2022-04-30 MED ORDER — COLESTIPOL HCL 1 G PO TABS: 2.0000 g | ORAL_TABLET | Freq: Two times a day (BID) | ORAL | 1 refills | Status: DC

## 2022-04-30 MED ORDER — POTASSIUM CHLORIDE CRYS ER 20 MEQ PO TBCR: 20.0000 meq | EXTENDED_RELEASE_TABLET | ORAL | 0 refills | Status: DC

## 2022-04-30 NOTE — Progress Notes (Signed)
   04/30/22 0900  Vitals  BP (!) 162/93  Pulse Rate 81  MEWS COLOR  MEWS Score Color Green  MEWS Score  MEWS Temp 0  MEWS Systolic 0  MEWS Pulse 0  MEWS RR 0  MEWS LOC 0  MEWS Score 0  Provider Notification  Provider Name/Title Florencia Reasons, MD  Date Provider Notified 04/30/22  Time Provider Notified [phone]  Method of Notification  (secure chat)  Notification Reason Other (Comment) (10 beat run V-tach)

## 2022-04-30 NOTE — Discharge Summary (Signed)
Discharge Summary  Tami [name] BMW:[id number] DOB: 1956/08/17  PCP: Mosie Lukes, MD  Admit date: 04/27/2022 Discharge date: 04/30/2022  Time spent:  110mins  Recommendations for Outpatient Follow-up:  F/u with PCP within a week  for hospital discharge follow up, repeat cbc/bmp at follow up F/u with GI F/u hematology/oncology F/u with cardiology  Monitor potassium level  Hold magnesium if having diarrhea New meds:  potassium supplement colestipol  Discharge Diagnoses:  Active Hospital Problems   Diagnosis Date Noted   SBO (small bowel obstruction) ([place]) 04/27/2022   Obesity (BMI 30-39.9) 04/28/2022    Priority: 6.   Soft tissue lesion 04/28/2022   Multiple myeloma ([place]) 07/20/2018   Congestive heart failure ([place]) 02/24/2015   Hypertension     Resolved Hospital Problems  No resolved problems to display.    Discharge Condition: stable  Diet recommendation: heart healthy  Filed Weights   04/27/22 2035  Weight: 90.7 kg    History of present illness: ( per admitting provider Dr Flossie Buffy) [name] is a 66 y.o. 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    Has had 5 months of ongoing diarrhea followed by her PCP that worsened over the past 2 weeks. Having diarrhea 3-5x daily. Then became constipated in the past 2 days. Last night begin to have diffuse abdominal pain, nausea and vomiting but was unable to have bowel movement. Took fleet enema and stool softener and then went to ED.    Distal partial small bowel obstruction within the RL quadrant seen on CT A/P. General surgery was consulted but pt refused NG tube. While awaiting transfer here to [facility] she was able to have 5 bowel movements with improvement in her pain and tolerated liquid diet without nausea or vomiting. She was offered the option to  be discharged home by ED physician but choose to continue with admission for observation.     Hospital Course:  Principal Problem:   SBO (small bowel obstruction) (HCC) Active Problems:   Obesity (BMI 30-39.9)   Hypertension   Congestive heart failure (HCC)   Multiple myeloma (HCC)   Soft tissue lesion   Assessment and Plan:      * SBO (small bowel obstruction) (HCC) -CT A/P with distal small bowel obstruction that now appears to be resolving. She was able to have 5 bowel movement and tolerate liquid diet while awaiting transfer here for outside ED. Repeat abdominal X-ray also shows resolution of obstruction. -seen by gen surg, nothing to do since sbo has resolved.   Ab pain/ diarrhea -h/o cholecystectomy  -appears has been intermittent since 11/2021 -reports feeling dehydrated. Wants hydration,  -GI consulted , she is started on colestid -she felt better   Soft tissue lesion: Stable 1.0 cm polypoid appearing area of soft tissue attenuation within the lumen of a loop of distal small bowel. Given its stability, this may be benign in etiology, however, GI consult is recommended. Seen by GI who recommend f/u with Dr. Lyndel Safe on 05/28/2022.  At that time they can discuss possible CT enterography for further evaluation of polypoid appearing soft tissue area in the lumen of the distal small bowel    hypokalemia Replace k   Multiple myeloma ([place]) Follows with oncologist Dr. Marin Olp. Currently under observation without treatment.   Congestive heart failure (HCC) Combined systolic and diastolic CHF -  s/p BiV ICD with recent device replacement in March 2023 -Follows with cardiology at [facility]  -appear dehydrated, received gentle  hydration  F/u with cardiology   Hypertension Bp stable on current meds     Obesity (BMI 30-39.9) BMI of 33.        Discharge Exam: BP (!) 155/88 (BP Location: Left Arm)   Pulse 76   Temp 98 F (36.7 C)   Resp 20   Ht $R'5\' 5"'iI$  (1.651 m)    Wt 90.7 kg   SpO2 99%   BMI 33.28 kg/m   General: NAD Cardiovascular: RRR Respiratory: normal respiratory effort     Discharge Instructions     Diet - low sodium heart healthy   Complete by: As directed    Increase activity slowly   Complete by: As directed       Allergies as of 04/30/2022       Reactions   Benazepril Anaphylaxis, Swelling, Hives   angioedema Throat and lip swelling   Ondansetron Hcl Hives   Redness and hives post IV admin on 07/05/17   Codeine Nausea And Vomiting   Morphine Hives   Redness and hives noted post IV admin on 07/05/17        Medication List     STOP taking these medications    celecoxib 100 MG capsule Commonly known as: CeleBREX   cholestyramine 4 g packet Commonly known as: Questran   cyclobenzaprine 10 MG tablet Commonly known as: FLEXERIL   dicyclomine 10 MG capsule Commonly known as: BENTYL   diphenoxylate-atropine 2.5-0.025 MG tablet Commonly known as: LOMOTIL   Ferrous Fumarate-Folic Acid 115-7 MG Tabs   fluticasone furoate-vilanterol 100-25 MCG/ACT Aepb Commonly known as: BREO ELLIPTA   HYDROcodone-acetaminophen 5-325 MG tablet Commonly known as: NORCO/VICODIN   hydrOXYzine 10 MG tablet Commonly known as: ATARAX   montelukast 10 MG tablet Commonly known as: Singulair   omeprazole 40 MG capsule Commonly known as: PRILOSEC   promethazine 25 MG tablet Commonly known as: PHENERGAN   sucralfate 1 g tablet Commonly known as: Carafate   topiramate 50 MG tablet Commonly known as: Topamax   triamcinolone cream 0.1 % Commonly known as: KENALOG   zinc sulfate 220 (50 Zn) MG capsule       TAKE these medications    acetaminophen 500 MG tablet Commonly known as: TYLENOL Take 500 mg by mouth in the morning.   albuterol 108 (90 Base) MCG/ACT inhaler Commonly known as: VENTOLIN HFA Inhale 2 puffs into the lungs every 6 (six) hours as needed for wheezing or shortness of breath.   aspirin EC 81 MG  tablet Take 1 tablet (81 mg total) by mouth daily.   carvedilol 25 MG tablet Commonly known as: COREG Take 1 tablet (25 mg total) by mouth 2 (two) times daily with a meal.   cetirizine 10 MG tablet Commonly known as: ZYRTEC Take 10 mg by mouth daily as needed for allergies.   colestipol 1 g tablet Commonly known as: COLESTID Take 2 tablets (2 g total) by mouth every 12 (twelve) hours.   fluticasone 50 MCG/ACT nasal spray Commonly known as: FLONASE Place 2 sprays into both nostrils daily. What changed: when to take this   furosemide 20 MG tablet Commonly known as: LASIX Take 1 tablet by mouth daily as needed for fluid.   ibuprofen 800 MG tablet Commonly known as: ADVIL Take 1 tablet (800 mg total) by mouth 3 (three) times daily. What changed:  when to take  this reasons to take this   loratadine 10 MG tablet Commonly known as: CLARITIN Take 1 tablet (10 mg total) by mouth daily. What changed:  when to take this reasons to take this   losartan 50 MG tablet Commonly known as: COZAAR Take 50 mg by mouth daily.   magnesium oxide 400 MG tablet Commonly known as: MAG-OX Take 1 tablet (400 mg total) by mouth 2 (two) times daily. Hold if you have diarrhea What changed: additional instructions   MULTIVITAMIN ADULT PO Take 1 tablet by mouth daily.   oxyCODONE-acetaminophen 5-325 MG tablet Commonly known as: PERCOCET/ROXICET Take 1 tablet by mouth every 6 (six) hours as needed for severe pain.   potassium chloride SA 20 MEQ tablet Commonly known as: KLOR-CON M Take 1 tablet (20 mEq total) by mouth every Monday, Wednesday, and Friday. Please follow up with your doctor to monitor potassium level Start taking on: May 01, 2022   PROBIOTIC PO Take 1 capsule by mouth daily.   spironolactone 25 MG tablet Commonly known as: ALDACTONE Take 12.5 mg by mouth daily.   Vitamin D-3 25 MCG (1000 UT) Caps Take 1 capsule (1,000 Units total) by mouth daily.       Allergies   Allergen Reactions   Benazepril Anaphylaxis, Swelling and Hives    angioedema Throat and lip swelling   Ondansetron Hcl Hives    Redness and hives post IV admin on 07/05/17   Codeine Nausea And Vomiting   Morphine Hives    Redness and hives noted post IV admin on 07/05/17    Follow-up Information     Mosie Lukes, MD Follow up in 2 week(s).   Specialty: Family Medicine Why: hospital discharge follow up, Contact information: [address] [place] [address] [phone]         please check your potassium level at follow up appointment Follow up.                   The results of significant diagnostics from this hospitalization (including imaging, microbiology, ancillary and laboratory) are listed below for reference.    Significant Diagnostic Studies: DG Abd Portable 1V  Result Date: 04/28/2022 CLINICAL DATA:  Small bowel obstruction. EXAM: PORTABLE ABDOMEN - 1 VIEW COMPARISON:  None Available. FINDINGS: The bowel gas pattern is normal. No radio-opaque calculi or other significant radiographic abnormality are seen. IMPRESSION: Negative. Electronically Signed   By: Marijo Conception M.D.   On: 04/28/2022 12:52   CT ABDOMEN PELVIS W CONTRAST  Result Date: 04/27/2022 CLINICAL DATA:  Epigastric pain and nausea. EXAM: CT ABDOMEN AND PELVIS WITH CONTRAST TECHNIQUE: Multidetector CT imaging of the abdomen and pelvis was performed using the standard protocol following bolus administration of intravenous contrast. RADIATION DOSE REDUCTION: This exam was performed according to the departmental dose-optimization program which includes automated exposure control, adjustment of the mA and/or kV according to patient size and/or use of iterative reconstruction technique. CONTRAST:  175mL OMNIPAQUE IOHEXOL 300 MG/ML  SOLN COMPARISON:  April 13, 2022 and August 10, 2020 FINDINGS: Lower chest: No acute abnormality. Hepatobiliary: A 9 mm diameter focus of parenchymal low  attenuation is seen within the right lobe of the liver. Status post cholecystectomy. The common bile duct measures 9.5 mm in diameter. Pancreas: Unremarkable. No pancreatic ductal dilatation or surrounding inflammatory changes. Spleen: Normal in size without focal abnormality. Adrenals/Urinary Tract: Adrenal glands are unremarkable. Kidneys are normal in size, without renal calculi or hydronephrosis. Multiple stable  bilateral simple renal cysts are seen. No additional follow-up or imaging is recommended. Bladder is unremarkable. Stomach/Bowel: Stomach is within normal limits. Appendix appears normal. Multiple dilated loops of distal jejunum and proximal to mid ileum are seen within the lower abdomen and pelvis (maximum small bowel diameter of approximately 3.2 cm). A transition zone is seen within the right lower quadrant (coronal reformatted images 51 through 56, CT series 5). A stable 1.0 cm polypoid appearing area of soft tissue attenuation is seen within the lumen of a dilated loop of distal small bowel (axial CT image 57, CT series 2/coronal reformatted image 39, CT series 5). This is seen on image 59, CT series 2 of the most recent prior study and appears to be present on image 59, CT series 2 of the abdomen and pelvis CT dated August 10, 2020. Vascular/Lymphatic: No significant vascular findings are present. No enlarged abdominal or pelvic lymph nodes. Reproductive: Status post hysterectomy. No adnexal masses. Other: No abdominal wall hernia or abnormality. No abdominopelvic ascites. Musculoskeletal: No acute or significant osseous findings. IMPRESSION: 1. Distal partial small bowel obstruction with a transition zone seen within the right lower quadrant. 2. Stable 1.0 cm polypoid appearing area of soft tissue attenuation within the lumen of a loop of distal small bowel. Given its stability, this may be benign in etiology, however, GI consult is recommended. 3. Stable bilateral simple renal cysts. 4. Status  post cholecystectomy. 5. Small, stable hepatic cyst or hemangioma. Electronically Signed   By: Virgina Norfolk M.D.   On: 04/27/2022 22:30   CT Abdomen Pelvis W Contrast  Result Date: 04/13/2022 CLINICAL DATA:  Acute abdomen pain with diarrhea for several weeks. EXAM: CT ABDOMEN AND PELVIS WITH CONTRAST TECHNIQUE: Multidetector CT imaging of the abdomen and pelvis was performed using the standard protocol following bolus administration of intravenous contrast. RADIATION DOSE REDUCTION: This exam was performed according to the departmental dose-optimization program which includes automated exposure control, adjustment of the mA and/or kV according to patient size and/or use of iterative reconstruction technique. CONTRAST:  139mL OMNIPAQUE IOHEXOL 300 MG/ML  SOLN COMPARISON:  October 16, 2021 FINDINGS: Lower chest: Minimal atelectasis of left lung base is identified. The heart size is enlarged. Hepatobiliary: Stable small liver cysts are identified in the right lobe liver measuring up to 6 mm. The gallbladder is surgically absent. Common bile ducts measures maximum 1 cm, postsurgical. Pancreas: Unremarkable. No pancreatic ductal dilatation or surrounding inflammatory changes. Spleen: Normal in size without focal abnormality. Adrenals/Urinary Tract: Bilateral adrenal glands are normal. There is no hydronephrosis bilaterally. Stable simple cysts are identified in bilateral kidneys, largest in the midpole right kidney measuring 1.8 cm unchanged compared prior exam. No follow-up is necessary. The bladder is partially decompressed with air within the bladder lumen. Correlation with recent instrumentation is suggested. Stomach/Bowel: Stomach is within normal limits. Appendix appears normal. No evidence of bowel wall thickening, distention, or inflammatory changes. Vascular/Lymphatic: No significant vascular findings are present. No enlarged abdominal or pelvic lymph nodes. Reproductive: Status post hysterectomy. No  adnexal masses. Other: Gluteus injection granulomas are identified unchanged. Musculoskeletal: Degenerative joint changes of the spine are noted. IMPRESSION: 1. No acute abnormality identified in the abdomen and pelvis. 2. Air within the bladder lumen. Correlation with recent instrumentation is suggested. Electronically Signed   By: Abelardo Diesel M.D.   On: 04/13/2022 07:59    Microbiology: No results found for this or any previous visit (from the past 240 hour(s)).   Labs: Basic Metabolic Panel: Recent  Labs  Lab 04/27/22 2046 04/27/22 2143 04/29/22 0408 04/30/22 0401  NA 136  --  139 142  K 3.9  --  3.4* 3.3*  CL 105  --  107 113*  CO2 23  --  25 25  GLUCOSE 150*  --  107* 94  BUN 16  --  17 15  CREATININE 0.69  --  0.76 0.51  CALCIUM 9.3  --  8.7* 8.7*  MG  --  1.9  --  1.9   Liver Function Tests: Recent Labs  Lab 04/27/22 2046  AST 32  ALT 21  ALKPHOS 55  BILITOT 0.6  PROT 9.2*  ALBUMIN 3.6   Recent Labs  Lab 04/27/22 2046  LIPASE 33   No results for input(s): "AMMONIA" in the last 168 hours. CBC: Recent Labs  Lab 04/27/22 2046 04/29/22 0408 04/30/22 0401  WBC 4.1 3.2* 2.0*  NEUTROABS  --   --  0.6*  HGB 12.8 11.0* 10.2*  HCT 39.5 34.5* 32.4*  MCV 87.6 88.9 89.8  PLT 145* 105* 98*   Cardiac Enzymes: No results for input(s): "CKTOTAL", "CKMB", "CKMBINDEX", "TROPONINI" in the last 168 hours. BNP: BNP (last 3 results) No results for input(s): "BNP" in the last 8760 hours.  ProBNP (last 3 results) No results for input(s): "PROBNP" in the last 8760 hours.  CBG: No results for input(s): "GLUCAP" in the last 168 hours.  FURTHER DISCHARGE INSTRUCTIONS:   Get Medicines reviewed and adjusted: Please take all your medications with you for your next visit with your Primary MD   Laboratory/radiological data: Please request your Primary MD to go over all hospital tests and procedure/radiological results at the follow up, please ask your Primary MD to get  all Hospital records sent to his/her office.   In some cases, they will be blood work, cultures and biopsy results pending at the time of your discharge. Please request that your primary care M.D. goes through all the records of your hospital data and follows up on these results.   Also Note the following: If you experience worsening of your admission symptoms, develop shortness of breath, life threatening emergency, suicidal or homicidal thoughts you must seek medical attention immediately by calling 911 or calling your MD immediately  if symptoms less severe.   You must read complete instructions/literature along with all the possible adverse reactions/side effects for all the Medicines you take and that have been prescribed to you. Take any new Medicines after you have completely understood and accpet all the possible adverse reactions/side effects.    Do not drive when taking Pain medications or sleeping medications (Benzodaizepines)   Do not take more than prescribed Pain, Sleep and Anxiety Medications. It is not advisable to combine anxiety,sleep and pain medications without talking with your primary care practitioner   Special Instructions: If you have smoked or chewed Tobacco  in the last 2 yrs please stop smoking, stop any regular Alcohol  and or any Recreational drug use.   Wear Seat belts while driving.   Please note: You were cared for by a hospitalist during your hospital stay. Once you are discharged, your primary care physician will handle any further medical issues. Please note that NO REFILLS for any discharge medications will be authorized once you are discharged, as it is imperative that you return to your primary care physician (or establish a relationship with a primary care physician if you do not have one) for your post hospital discharge needs so that they can  reassess your need for medications and monitor your lab values.     Signed:  Florencia Reasons MD, PhD, FACP  [facility] 04/30/2022, 4:54 PM

## 2022-04-30 NOTE — Plan of Care (Signed)
  Problem: Clinical Measurements: Goal: Ability to maintain clinical measurements within normal limits will improve Outcome: Progressing Goal: Will remain free from infection Outcome: Progressing Goal: Respiratory complications will improve Outcome: Progressing   

## 2022-04-30 NOTE — Plan of Care (Signed)

## 2022-04-30 NOTE — Progress Notes (Signed)
Pt had 9 beat run of Vtach, asymptomatic. Provider notified. No new orders.

## 2022-04-30 NOTE — Progress Notes (Signed)
Discharge instructions provided to and reviewed with patient.  Patient verbalized understanding. PIV and cardiac monitoring removed.  Patient escorted to main entrance with belongings via wheelchair for transport home with sister.  Angie Fava, RN

## 2022-05-01 ENCOUNTER — Inpatient Hospital Stay (HOSPITAL_BASED_OUTPATIENT_CLINIC_OR_DEPARTMENT_OTHER): Payer: Medicare Other | Admitting: Hematology & Oncology

## 2022-05-01 ENCOUNTER — Other Ambulatory Visit: Payer: Self-pay

## 2022-05-01 ENCOUNTER — Inpatient Hospital Stay: Payer: Medicare Other | Attending: Hematology & Oncology

## 2022-05-01 ENCOUNTER — Encounter: Payer: Self-pay | Admitting: Hematology & Oncology

## 2022-05-01 VITALS — BP 121/84 | HR 85 | Temp 97.8°F | Resp 20 | Ht 65.0 in | Wt 196.1 lb

## 2022-05-01 DIAGNOSIS — E860 Dehydration: Secondary | ICD-10-CM | POA: Diagnosis not present

## 2022-05-01 DIAGNOSIS — Z79899 Other long term (current) drug therapy: Secondary | ICD-10-CM | POA: Diagnosis not present

## 2022-05-01 DIAGNOSIS — Z885 Allergy status to narcotic agent status: Secondary | ICD-10-CM | POA: Insufficient documentation

## 2022-05-01 DIAGNOSIS — C9000 Multiple myeloma not having achieved remission: Secondary | ICD-10-CM | POA: Diagnosis not present

## 2022-05-01 DIAGNOSIS — K219 Gastro-esophageal reflux disease without esophagitis: Secondary | ICD-10-CM

## 2022-05-01 DIAGNOSIS — D801 Nonfamilial hypogammaglobulinemia: Secondary | ICD-10-CM

## 2022-05-01 DIAGNOSIS — K56609 Unspecified intestinal obstruction, unspecified as to partial versus complete obstruction: Secondary | ICD-10-CM | POA: Diagnosis not present

## 2022-05-01 DIAGNOSIS — D518 Other vitamin B12 deficiency anemias: Secondary | ICD-10-CM | POA: Diagnosis not present

## 2022-05-01 LAB — URINALYSIS, COMPLETE (UACMP) WITH MICROSCOPIC
Glucose, UA: NEGATIVE mg/dL
Hgb urine dipstick: NEGATIVE
Ketones, ur: NEGATIVE mg/dL
Leukocytes,Ua: NEGATIVE
Nitrite: NEGATIVE
Protein, ur: 30 mg/dL — AB
Specific Gravity, Urine: 1.025 (ref 1.005–1.030)
WBC, UA: NONE SEEN WBC/hpf (ref 0–5)
pH: 5.5 (ref 5.0–8.0)

## 2022-05-01 LAB — CBC WITH DIFFERENTIAL (CANCER CENTER ONLY)
Abs Immature Granulocytes: 0 10*3/uL (ref 0.00–0.07)
Basophils Absolute: 0 10*3/uL (ref 0.0–0.1)
Basophils Relative: 1 %
Eosinophils Absolute: 0 10*3/uL (ref 0.0–0.5)
Eosinophils Relative: 2 %
HCT: 36.7 % (ref 36.0–46.0)
Hemoglobin: 11.9 g/dL — ABNORMAL LOW (ref 12.0–15.0)
Immature Granulocytes: 0 %
Lymphocytes Relative: 39 %
Lymphs Abs: 1 10*3/uL (ref 0.7–4.0)
MCH: 28.3 pg (ref 26.0–34.0)
MCHC: 32.4 g/dL (ref 30.0–36.0)
MCV: 87.4 fL (ref 80.0–100.0)
Monocytes Absolute: 0.3 10*3/uL (ref 0.1–1.0)
Monocytes Relative: 12 %
Neutro Abs: 1.1 10*3/uL — ABNORMAL LOW (ref 1.7–7.7)
Neutrophils Relative %: 46 %
Platelet Count: 140 10*3/uL — ABNORMAL LOW (ref 150–400)
RBC: 4.2 MIL/uL (ref 3.87–5.11)
RDW: 13.3 % (ref 11.5–15.5)
WBC Count: 2.5 10*3/uL — ABNORMAL LOW (ref 4.0–10.5)
nRBC: 0 % (ref 0.0–0.2)

## 2022-05-01 LAB — CMP (CANCER CENTER ONLY)
ALT: 15 U/L (ref 0–44)
AST: 36 U/L (ref 15–41)
Albumin: 3.8 g/dL (ref 3.5–5.0)
Alkaline Phosphatase: 49 U/L (ref 38–126)
Anion gap: 9 (ref 5–15)
BUN: 16 mg/dL (ref 8–23)
CO2: 24 mmol/L (ref 22–32)
Calcium: 9.8 mg/dL (ref 8.9–10.3)
Chloride: 105 mmol/L (ref 98–111)
Creatinine: 0.79 mg/dL (ref 0.44–1.00)
GFR, Estimated: >60 mL/min (ref >60)
Glucose, Bld: 114 mg/dL — ABNORMAL HIGH (ref 70–99)
Potassium: 3.8 mmol/L (ref 3.5–5.1)
Sodium: 138 mmol/L (ref 135–145)
Total Bilirubin: 0.3 mg/dL (ref 0.3–1.2)
Total Protein: 8.9 g/dL — ABNORMAL HIGH (ref 6.5–8.1)

## 2022-05-01 LAB — LACTATE DEHYDROGENASE: LDH: 249 U/L — ABNORMAL HIGH (ref 98–192)

## 2022-05-01 LAB — FERRITIN: Ferritin: 449 ng/mL — ABNORMAL HIGH (ref 11–307)

## 2022-05-01 LAB — VITAMIN B12: Vitamin B-12: 222 pg/mL (ref 180–914)

## 2022-05-01 MED ORDER — AMOXICILLIN 500 MG PO TABS: 500.0000 mg | ORAL_TABLET | Freq: Two times a day (BID) | ORAL | 0 refills | Status: DC

## 2022-05-02 LAB — URINE CULTURE: Culture: <10000 — AB

## 2022-05-03 LAB — IGG, IGA, IGM
IgA: 142 mg/dL (ref 87–352)
IgG (Immunoglobin G), Serum: 2945 mg/dL — ABNORMAL HIGH (ref 586–1602)
IgM (Immunoglobulin M), Srm: 115 mg/dL (ref 26–217)

## 2022-05-04 ENCOUNTER — Telehealth: Payer: Self-pay

## 2022-05-04 ENCOUNTER — Telehealth: Payer: Self-pay | Admitting: Family Medicine

## 2022-05-04 LAB — PROTEIN ELECTROPHORESIS, SERUM
A/G Ratio: 0.7 (ref 0.7–1.7)
Albumin ELP: 3.3 g/dL (ref 2.9–4.4)
Alpha-1-Globulin: 0.2 g/dL (ref 0.0–0.4)
Alpha-2-Globulin: 1.1 g/dL — ABNORMAL HIGH (ref 0.4–1.0)
Beta Globulin: 1 g/dL (ref 0.7–1.3)
Gamma Globulin: 2.5 g/dL — ABNORMAL HIGH (ref 0.4–1.8)
Globulin, Total: 4.9 g/dL — ABNORMAL HIGH (ref 2.2–3.9)
M-Spike, %: 2 g/dL — ABNORMAL HIGH
Total Protein ELP: 8.2 g/dL (ref 6.0–8.5)

## 2022-05-04 LAB — IRON AND IRON BINDING CAPACITY (CC-WL,HP ONLY)
Iron: 41 ug/dL (ref 28–170)
Saturation Ratios: 11 % (ref 10.4–31.8)
TIBC: 368 ug/dL (ref 250–450)
UIBC: 327 ug/dL (ref 148–442)

## 2022-05-04 LAB — KAPPA/LAMBDA LIGHT CHAINS
Kappa free light chain: 87.6 mg/L — ABNORMAL HIGH (ref 3.3–19.4)
Kappa, lambda light chain ratio: 17.18 — ABNORMAL HIGH (ref 0.26–1.65)
Lambda free light chains: 5.1 mg/L — ABNORMAL LOW (ref 5.7–26.3)

## 2022-05-04 NOTE — Telephone Encounter (Signed)
Patient states she was just discharged from [facility] and wanted to schedule a HFU. Advised pt that Dr. Abner Greenspan did not have any available appts but she could be scheduled with Efraim Kaufmann or Ladona Ridgel but patient stated she wanted Dr. Patsy Lager. Informed pt that [facility] and Ladona Ridgel are the 2 providers that work with Dr. Abner Greenspan and they work with her patient. Patient stated again that she wanted [facility] and wanted a message sent to [facility]MA. Please advise.

## 2022-05-05 ENCOUNTER — Telehealth: Payer: Self-pay | Admitting: Family Medicine

## 2022-05-06 ENCOUNTER — Other Ambulatory Visit: Payer: Self-pay

## 2022-05-06 DIAGNOSIS — R109 Unspecified abdominal pain: Secondary | ICD-10-CM | POA: Diagnosis not present

## 2022-05-06 DIAGNOSIS — R1084 Generalized abdominal pain: Secondary | ICD-10-CM | POA: Diagnosis not present

## 2022-05-06 DIAGNOSIS — R779 Abnormality of plasma protein, unspecified: Secondary | ICD-10-CM | POA: Diagnosis not present

## 2022-05-06 DIAGNOSIS — D72819 Decreased white blood cell count, unspecified: Secondary | ICD-10-CM | POA: Diagnosis not present

## 2022-05-06 DIAGNOSIS — H35713 Central serous chorioretinopathy, bilateral: Secondary | ICD-10-CM | POA: Diagnosis not present

## 2022-05-06 DIAGNOSIS — Z79899 Other long term (current) drug therapy: Secondary | ICD-10-CM | POA: Diagnosis not present

## 2022-05-06 NOTE — Telephone Encounter (Signed)
Pt  scheduled with Melissa on 6/30

## 2022-05-06 NOTE — Telephone Encounter (Signed)
Pt has upcoming appointment 6/30, currently there is no documentation about pt being homebound and/or needing assistance.

## 2022-05-08 ENCOUNTER — Inpatient Hospital Stay: Payer: Medicare Other | Admitting: Family

## 2022-05-18 DIAGNOSIS — I428 Other cardiomyopathies: Secondary | ICD-10-CM | POA: Diagnosis not present

## 2022-05-18 DIAGNOSIS — I509 Heart failure, unspecified: Secondary | ICD-10-CM | POA: Diagnosis not present

## 2022-05-18 DIAGNOSIS — Z4502 Encounter for adjustment and management of automatic implantable cardiac defibrillator: Secondary | ICD-10-CM | POA: Diagnosis not present

## 2022-05-18 DIAGNOSIS — Z95810 Presence of automatic (implantable) cardiac defibrillator: Secondary | ICD-10-CM | POA: Diagnosis not present

## 2022-05-19 ENCOUNTER — Ambulatory Visit: Payer: Medicare Other | Admitting: Family Medicine

## 2022-05-20 NOTE — Progress Notes (Unsigned)
Subjective:    [name], female    DOB: 22-Jan-1956, 66 y.o.   MRN: [id number]  No chief complaint on file.   HPI Patient is in today for a follow up.  Past Medical History:  Diagnosis Date   Abnormal SPEP 07/26/2016   Arthritis    knees, hands   Back pain 07/14/2016   Bowel obstruction (HCC) 11/2014   C. 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   Multiple myeloma (HCC) 07/20/2018   Multiple myeloma not having achieved remission (HCC) 07/20/2018   Myalgia 12/31/2016   Obstructive sleep apnea 03/31/2015   SOB (shortness of breath) 04/09/2016   Stroke (HCC)    TIAs   UTI (urinary tract infection)     Past Surgical History:  Procedure Laterality Date   ABDOMINAL HYSTERECTOMY     menorraghia, 2006, total   CHOLECYSTECTOMY     COLONOSCOPY  2018   [facility] per patient   ESOPHAGOGASTRODUODENOSCOPY  2018   [facility]    KNEE SURGERY     right, repair torn torn cartialage   PACEMAKER INSERTION  08/2014   TONSILLECTOMY     TUBAL LIGATION      Family History  Problem Relation Age of Onset   Diabetes Mother    Hypertension Mother    Heart disease Mother        s/p 1 stent   Hyperlipidemia Mother    Arthritis Mother    Cancer Father        COLON   Colon cancer Father 46   Irritable bowel syndrome Sister    Hyperlipidemia Daughter    Hypertension Daughter    Hypertension Maternal Grandmother    Arthritis Maternal Grandmother    Heart disease Maternal Grandfather        MI   Hypertension Maternal Grandfather    Arthritis Maternal Grandfather     Hypertension Son    Esophageal cancer Neg Hx     Social History   Socioeconomic History   Marital status: Divorced    Spouse name: [name]   Number of children: 3   Years of education: [name]   Highest education level: High school graduate  Occupational History   [name]  Tobacco Use   Smoking status: Never   Smokeless tobacco: Never  Vaping Use   Vaping Use: Never used  Substance and Sexual Activity   Alcohol use: No   Drug use: No   Sexual activity: [name]  Other Topics Concern   [name]  Social History Narrative   Lives at home alone   Retired   Caffeine: coffee   Social Determinants of Health   Financial Resource Strain: Low Risk  (10/17/2021)   Overall Financial Resource Strain (CARDIA)    Difficulty of Paying Living Expenses: Not hard at all  Food Insecurity: No Food Insecurity (10/17/2021)   Hunger Vital Sign    Worried About Running Out of Food in the Last Year: Never true    Ran Out of  Food in the Last Year: Never true  Transportation Needs: No Transportation Needs (10/17/2021)   PRAPARE - Hydrologist (Medical): No    Lack of Transportation (Non-Medical): No  Physical Activity: Insufficiently Active (10/17/2021)   Exercise Vital Sign    Days of Exercise per Week: 2 days    Minutes of Exercise per Session: 30 min  Stress: No Stress Concern Present (10/17/2021)   [place]    Feeling of Stress : Not at all  Social Connections: Moderately Isolated (10/17/2021)   Social Connection and Isolation Panel [NHANES]    Frequency of Communication with Friends and Family: More than three times a week    Frequency of Social Gatherings with Friends and Family: More than three times a week    Attends Religious Services: More than 4 times per year    Active Member of [employer] or Organizations: No    Attends Archivist Meetings: Never    Marital  Status: Divorced  Human resources officer Violence: Not At Risk (10/17/2021)   Humiliation, Afraid, Rape, and Kick questionnaire    Fear of Current or Ex-Partner: No    Emotionally Abused: No    Physically Abused: No    Sexually Abused: No    Outpatient Medications Prior to Visit  Medication Sig Dispense Refill   acetaminophen (TYLENOL) 500 MG tablet Take 500 mg by mouth in the morning.     albuterol (VENTOLIN HFA) 108 (90 Base) MCG/ACT inhaler Inhale 2 puffs into the lungs every 6 (six) hours as needed for wheezing or shortness of breath. 18 g 5   amoxicillin (AMOXIL) 500 MG tablet Take 1 tablet (500 mg total) by mouth 2 (two) times daily. 14 tablet 0   aspirin EC 81 MG tablet Take 1 tablet (81 mg total) by mouth daily. 90 tablet 1   carvedilol (COREG) 25 MG tablet Take 1 tablet (25 mg total) by mouth 2 (two) times daily with a meal. 180 tablet 1   cetirizine (ZYRTEC) 10 MG tablet Take 10 mg by mouth daily as needed for allergies.     Cholecalciferol (VITAMIN D-3) 25 MCG (1000 UT) CAPS Take 1 capsule (1,000 Units total) by mouth daily. 60 capsule 2   colestipol (COLESTID) 1 g tablet Take 2 tablets (2 g total) by mouth every 12 (twelve) hours. 120 tablet 1   fluticasone (FLONASE) 50 MCG/ACT nasal spray Place 2 sprays into both nostrils daily. (Patient taking differently: Place 2 sprays into both nostrils every evening.) 16 g 0   furosemide (LASIX) 20 MG tablet Take 1 tablet by mouth daily as needed for fluid.     ibuprofen (ADVIL) 800 MG tablet Take 1 tablet (800 mg total) by mouth 3 (three) times daily. (Patient taking differently: Take 800 mg by mouth every 8 (eight) hours as needed for moderate pain.) 21 tablet 0   loratadine (CLARITIN) 10 MG tablet Take 1 tablet (10 mg total) by mouth daily. (Patient taking differently: Take 10 mg by mouth daily as needed for allergies.) 30 tablet 11   losartan (COZAAR) 50 MG tablet Take 50 mg by mouth daily.     magnesium oxide (MAG-OX) 400 MG tablet Take 1  tablet (400 mg total) by mouth 2 (two) times daily. Hold if you have diarrhea 60 tablet 1   Multiple Vitamins-Minerals (MULTIVITAMIN ADULT PO) Take 1 tablet by mouth daily.     oxyCODONE-acetaminophen (PERCOCET/ROXICET) 5-325 MG tablet Take 1 tablet  by mouth every 6 (six) hours as needed for severe pain.     potassium chloride SA (KLOR-CON M) 20 MEQ tablet Take 1 tablet (20 mEq total) by mouth every Monday, Wednesday, and Friday. Please follow up with your doctor to monitor potassium level 30 tablet 0   Probiotic Product (PROBIOTIC PO) Take 1 capsule by mouth daily.     spironolactone (ALDACTONE) 25 MG tablet Take 12.5 mg by mouth daily.     No facility-administered medications prior to visit.    Allergies  Allergen Reactions   Benazepril Anaphylaxis, Swelling and Hives    angioedema Throat and lip swelling   Ondansetron Hcl Hives    Redness and hives post IV admin on 07/05/17   Codeine Nausea And Vomiting   Morphine Hives    Redness and hives noted post IV admin on 07/05/17    ROS     Objective:    Physical Exam  There were no vitals taken for this visit. Wt Readings from Last 3 Encounters:  05/01/22 196 lb 1.9 oz (89 kg)  04/27/22 200 lb (90.7 kg)  04/21/22 200 lb (90.7 kg)    Diabetic Foot Exam - Simple   No data filed    Lab Results  Component Value Date   WBC 2.5 (L) 05/01/2022   HGB 11.9 (L) 05/01/2022   HCT 36.7 05/01/2022   PLT 140 (L) 05/01/2022   GLUCOSE 114 (H) 05/01/2022   CHOL 171 04/21/2022   TRIG 105.0 04/21/2022   HDL 49.40 04/21/2022   LDLCALC 100 (H) 04/21/2022   ALT 15 05/01/2022   AST 36 05/01/2022   NA 138 05/01/2022   K 3.8 05/01/2022   CL 105 05/01/2022   CREATININE 0.79 05/01/2022   BUN 16 05/01/2022   CO2 24 05/01/2022   TSH 1.97 04/21/2022   INR 0.90 07/28/2018   HGBA1C 5.8 04/21/2022    Lab Results  Component Value Date   TSH 1.97 04/21/2022   Lab Results  Component Value Date   WBC 2.5 (L) 05/01/2022   HGB 11.9 (L)  05/01/2022   HCT 36.7 05/01/2022   MCV 87.4 05/01/2022   PLT 140 (L) 05/01/2022   Lab Results  Component Value Date   NA 138 05/01/2022   K 3.8 05/01/2022   CHLORIDE 108 12/31/2016   CO2 24 05/01/2022   GLUCOSE 114 (H) 05/01/2022   BUN 16 05/01/2022   CREATININE 0.79 05/01/2022   BILITOT 0.3 05/01/2022   ALKPHOS 49 05/01/2022   AST 36 05/01/2022   ALT 15 05/01/2022   PROT 8.9 (H) 05/01/2022   ALBUMIN 3.8 05/01/2022   CALCIUM 9.8 05/01/2022   ANIONGAP 9 05/01/2022   EGFR >90 12/31/2016   GFR 93.33 04/21/2022   Lab Results  Component Value Date   CHOL 171 04/21/2022   Lab Results  Component Value Date   HDL 49.40 04/21/2022   Lab Results  Component Value Date   LDLCALC 100 (H) 04/21/2022   Lab Results  Component Value Date   TRIG 105.0 04/21/2022   Lab Results  Component Value Date   CHOLHDL 3 04/21/2022   Lab Results  Component Value Date   HGBA1C 5.8 04/21/2022       Assessment & Plan:      Problem List Items Addressed This Visit   None   I am having Tami [name] maintain her aspirin EC, Multiple Vitamins-Minerals (MULTIVITAMIN ADULT PO), fluticasone, Vitamin D-3, carvedilol, furosemide, acetaminophen, losartan, ibuprofen, loratadine, albuterol, spironolactone, Probiotic Product (  PROBIOTIC PO), cetirizine, oxyCODONE-acetaminophen, magnesium oxide, colestipol, potassium chloride SA, and amoxicillin.  No orders of the defined types were placed in this encounter.

## 2022-05-21 ENCOUNTER — Ambulatory Visit (INDEPENDENT_AMBULATORY_CARE_PROVIDER_SITE_OTHER): Payer: Medicare Other | Admitting: Family Medicine

## 2022-05-21 ENCOUNTER — Ambulatory Visit: Payer: Medicare Other | Admitting: Family Medicine

## 2022-05-21 ENCOUNTER — Telehealth: Payer: Self-pay | Admitting: Family Medicine

## 2022-05-21 ENCOUNTER — Ambulatory Visit (HOSPITAL_BASED_OUTPATIENT_CLINIC_OR_DEPARTMENT_OTHER)
Admission: RE | Admit: 2022-05-21 | Discharge: 2022-05-21 | Disposition: A | Discharge status: Home / Self Care | Payer: Medicare Other | Source: Ambulatory Visit | Attending: Family Medicine | Admitting: Family Medicine

## 2022-05-21 ENCOUNTER — Encounter: Payer: Self-pay | Admitting: Family Medicine

## 2022-05-21 VITALS — BP 122/86 | HR 91 | Resp 20 | Ht 65.0 in | Wt 197.8 lb

## 2022-05-21 DIAGNOSIS — K56609 Unspecified intestinal obstruction, unspecified as to partial versus complete obstruction: Secondary | ICD-10-CM | POA: Insufficient documentation

## 2022-05-21 DIAGNOSIS — R109 Unspecified abdominal pain: Secondary | ICD-10-CM | POA: Diagnosis not present

## 2022-05-21 DIAGNOSIS — R739 Hyperglycemia, unspecified: Secondary | ICD-10-CM

## 2022-05-21 DIAGNOSIS — E785 Hyperlipidemia, unspecified: Secondary | ICD-10-CM

## 2022-05-21 DIAGNOSIS — D649 Anemia, unspecified: Secondary | ICD-10-CM | POA: Diagnosis not present

## 2022-05-21 DIAGNOSIS — I10 Essential (primary) hypertension: Secondary | ICD-10-CM

## 2022-05-21 DIAGNOSIS — K2100 Gastro-esophageal reflux disease with esophagitis, without bleeding: Secondary | ICD-10-CM | POA: Diagnosis not present

## 2022-05-21 DIAGNOSIS — R1013 Epigastric pain: Secondary | ICD-10-CM

## 2022-05-21 DIAGNOSIS — R7989 Other specified abnormal findings of blood chemistry: Secondary | ICD-10-CM | POA: Diagnosis not present

## 2022-05-21 LAB — COMPREHENSIVE METABOLIC PANEL
ALT: 13 U/L (ref 0–35)
AST: 26 U/L (ref 0–37)
Albumin: 3.8 g/dL (ref 3.5–5.2)
Alkaline Phosphatase: 51 U/L (ref 39–117)
BUN: 18 mg/dL (ref 6–23)
CO2: 27 mEq/L (ref 19–32)
Calcium: 9.3 mg/dL (ref 8.4–10.5)
Chloride: 106 mEq/L (ref 96–112)
Creatinine, Ser: 0.68 mg/dL (ref 0.40–1.20)
GFR: 89.77 mL/min (ref >60.00)
Glucose, Bld: 89 mg/dL (ref 70–99)
Potassium: 4.2 mEq/L (ref 3.5–5.1)
Sodium: 139 mEq/L (ref 135–145)
Total Bilirubin: 0.3 mg/dL (ref 0.2–1.2)
Total Protein: 8.4 g/dL — ABNORMAL HIGH (ref 6.0–8.3)

## 2022-05-21 LAB — CBC WITH DIFFERENTIAL/PLATELET
Basophils Absolute: 0 10*3/uL (ref 0.0–0.1)
Basophils Relative: 1.4 % (ref 0.0–3.0)
Eosinophils Absolute: 0.1 10*3/uL (ref 0.0–0.7)
Eosinophils Relative: 3.4 % (ref 0.0–5.0)
HCT: 36.4 % (ref 36.0–46.0)
Hemoglobin: 11.7 g/dL — ABNORMAL LOW (ref 12.0–15.0)
Lymphocytes Relative: 37.5 % (ref 12.0–46.0)
Lymphs Abs: 0.8 10*3/uL (ref 0.7–4.0)
MCHC: 32.2 g/dL (ref 30.0–36.0)
MCV: 87.5 fl (ref 78.0–100.0)
Monocytes Absolute: 0.2 10*3/uL (ref 0.1–1.0)
Monocytes Relative: 10.2 % (ref 3.0–12.0)
Neutro Abs: 1 10*3/uL — ABNORMAL LOW (ref 1.4–7.7)
Neutrophils Relative %: 47.5 % (ref 43.0–77.0)
Platelets: 115 10*3/uL — ABNORMAL LOW (ref 150.0–400.0)
RBC: 4.16 Mil/uL (ref 3.87–5.11)
RDW: 15 % (ref 11.5–15.5)
WBC: 2 10*3/uL — ABNORMAL LOW (ref 4.0–10.5)

## 2022-05-21 LAB — SEDIMENTATION RATE: Sed Rate: >130 mm/hr — ABNORMAL HIGH (ref 0–30)

## 2022-05-21 LAB — MAGNESIUM: Magnesium: 2 mg/dL (ref 1.5–2.5)

## 2022-05-21 MED ORDER — HYOSCYAMINE SULFATE 0.125 MG SL SUBL
0.1250 mg | SUBLINGUAL_TABLET | SUBLINGUAL | 1 refills | Status: DC | PRN
Start: 2022-05-21 — End: 2023-04-27

## 2022-05-21 MED ORDER — SUCRALFATE 1 G PO TABS: 1.0000 g | ORAL_TABLET | Freq: Three times a day (TID) | ORAL | 2 refills | Status: DC

## 2022-05-21 NOTE — Assessment & Plan Note (Signed)
persistent but does worsen at times. Given Carafate to try qid to see if that helps and she has an appt with GI next week.

## 2022-05-21 NOTE — Assessment & Plan Note (Signed)
Increase leafy greens, consider increased lean red meat and using cast iron cookware. Continue to monitor, report any concerns 

## 2022-05-21 NOTE — Assessment & Plan Note (Signed)
Has resolved but maintain good fluid intake and a fiber supplement. She notes cramping with these episodes so she is given a prescription for Hyoscyamine to take as needed for cramping.

## 2022-05-21 NOTE — Assessment & Plan Note (Signed)
hgba1c acceptable, minimize simple carbs. Increase exercise as tolerated.  

## 2022-05-21 NOTE — Assessment & Plan Note (Signed)
Labs stable no new concerns, no changes

## 2022-05-21 NOTE — Assessment & Plan Note (Signed)
Was recently hospitalized for partial bowel obstruction. She has been altering between constipation and diarrhea. She has had no further episodes of bowel obstruction since home but encouraged to switch to clear liquids if symptoms occur. For constipation Encouraged increased hydration and fiber in diet. Daily probiotics. If bowels not moving can use MOM 2 tbls po in 4 oz of warm prune juice by mouth every 2-3 days. If no results then repeat in 4 hours with  Dulcolax suppository pr, may repeat again in 4 more hours as needed. Seek care if symptoms worsen. Consider daily Miralax and/or Dulcolax if symptoms persist. For diarrhea has been given colestid to take 2 tabs bid but she felt that was too much so will restart at 1 tab bid.

## 2022-05-21 NOTE — Telephone Encounter (Signed)
Pt asking for suggestions about pain relief.

## 2022-05-21 NOTE — Telephone Encounter (Signed)
PA for levsin sent, awaiting reply from insurance company.

## 2022-05-21 NOTE — Telephone Encounter (Signed)
Pt stated pharmacy started a PA for hyoscyamine (LEVSIN SL) 0.125 MG SL tablet and she would like to know what to do for her pain in the meantime.

## 2022-05-21 NOTE — Patient Instructions (Addendum)
Encouraged increased hydration and fiber in diet. Daily probiotics. If bowels not moving can use Milk Of Magnesia   Magnesium Glycinate 200-400 mg at bedtime  [place] website [employer] Bowel Obstruction A bowel obstruction is a blockage in the small or large bowel. The bowel is also called the intestine. It is a long tube that connects the stomach to the anus. When a person eats and drinks, food and fluids go from the mouth to the stomach to the small bowel. This is where most of the nutrients in the food and fluids are absorbed. After the small bowel, material passes through the large bowel for further absorption until any leftover material leaves the body as stool (feces) through the anus during a bowel movement. A bowel obstruction will prevent food and fluids from passing through the bowel as they normally do during digestion. The bowel can become partially or completely blocked. If this condition is not treated, it can be dangerous because the bowel could rupture. What are the causes? Common causes of this condition include: Scar tissue in the body (adhesions) from previous surgery or treatment with high-energy X-rays (radiation). Recent surgery. This may cause the movements of the bowel to slow down and cause food to block the intestine. Inflammatory bowel disease, such as Crohn's disease or diverticulitis. Growths or tumors. A bulging organ or tissue (hernia). Twisting of the bowel (volvulus). A swallowed object (foreign body). Slipping of a part of the bowel into another part (intussusception). What are the signs or symptoms? Symptoms of this condition include: Pain in the abdomen. Depending on the degree of obstruction, pain may be: Mild or severe. Dull cramping or sharp pain. In one area or in the entire abdomen. Nausea and vomiting. Vomit may be greenish or a yellow bile color. Bloating in the abdomen. Constipation. Being unable to pass  gas. Frequent belching. Diarrhea. This may occur if the obstruction is partial and runny stool is able to leak around the obstruction. How is this diagnosed? This condition may be diagnosed based on: A physical exam. Your medical history. Exams to look into the small intestine or the large intestine (endoscopy or colonoscopy). Imaging tests of the abdomen or pelvis, such as X-ray or CT scan. Blood or urine tests. How is this treated? Treatment for this condition depends on the cause and severity of the problem. Treatment may include: Fluids and pain medicines that are given through an IV. Your health care provider may tell you not to eat or drink if you have nausea or vomiting. A clear liquid diet. You may be asked to consume a clear liquid diet for several days. This allows the bowel to rest. Placement of a small tube (nasogastric tube) through the nose, down the throat, and into the stomach. This may relieve pain, discomfort, and nausea by removing blocked air and fluids from the stomach. It can also help the obstruction clear up faster. Surgery. This may be required if other treatments do not work. Surgery may be required for: Bowel obstruction from a hernia. This can be an emergency procedure. Scar tissue that causes frequent or severe obstructions. Follow these instructions at home: Medicines Take over-the-counter and prescription medicines only as told by your health care provider. If you were prescribed an antibiotic medicine, take it as told by your health care provider. Do not stop taking the antibiotic even if you start to feel better. General instructions Follow instructions from your health care provider about eating and drinking restrictions. You  may need to avoid solid foods and drink only clear liquids until your condition improves. Return to your normal activities as told by your health care provider. Ask your health care provider what activities are safe for you. Rest as  told by your health care provider. Avoid sitting for a long time without moving. Get up to take short walks every 1-2 hours. This is important to improve blood flow and breathing. Ask for help if you feel weak or unsteady. Keep all follow-up visits. This is important. How is this prevented? After having a bowel obstruction, you are more likely to have another. You may do the following things to prevent another obstruction: If you have a long-term (chronic) disease, pay attention to your symptoms and contact your health care provider if you have questions or concerns. Avoid becoming constipated. You may need to take these actions to prevent or treat constipation: Drink enough fluid to keep your urine pale yellow. Take over-the-counter or prescription medicines. Eat foods that are high in fiber, such as beans, whole grains, and fresh fruits and vegetables. Limit foods that are high in fat and processed sugars, such as fried or sweet foods. Stay active. Exercise for 30 minutes or more, 5 or more days each week. Ask your health care provider which exercises are safe for you. Avoid stress. Find ways to reduce stress, such as meditation, exercise, or taking time for activities that relax you. Instead of eating three large meals each day, eat three small meals with three small snacks. Work with a Microbiologist to make a healthy meal plan that works for you. Do not use any products that contain nicotine or tobacco. These products include cigarettes, chewing tobacco, and vaping devices, such as e-cigarettes. If you need help quitting, ask your health care provider. Contact a health care provider if: You have a fever. You have chills. Get help right away if: You have increased pain or cramping. You vomit blood. You have uncontrolled vomiting or nausea. You cannot drink fluids because of vomiting or pain. You become confused. You begin feeling very thirsty (dehydrated). You have severe bloating. You feel  extremely weak or you faint. Summary A bowel obstruction is a blockage in the small or large bowel. A bowel obstruction will prevent food and fluids from passing through the bowel as they normally do during digestion. Treatment for this condition depends on the cause and severity of the problem. It may include fluids and pain medicines through an IV, a simple diet, a nasogastric tube, or surgery. Follow instructions from your health care provider about eating restrictions. You may need to avoid solid foods and consume only clear liquids until your condition improves. This information is not intended to replace advice given to you by your health care provider. Make sure you discuss any questions you have with your health care provider. Document Revised: 12/08/2020 Document Reviewed: 12/08/2020 Elsevier Patient Education  [place]. 2 tbls po in 4 oz of warm prune juice by mouth every 2-3 days. If no results then repeat in 4 hours with  Dulcolax suppository pr, may repeat again in 4 more hours as needed. Seek care if symptoms worsen. Consider daily Miralax and/or Dulcolax if symptoms persist.

## 2022-05-22 NOTE — Telephone Encounter (Signed)
PA denied. Will contact pharmacy about alternative.   "Your requested medication belongs to a class of drugs called Less Than Effective Drug Efficacy Study Implementation ([place]) Drugs. DESI drugs are excluded from coverage under Medicare rules. Please refer to your Evidence of Coverage (EOC) section that references Part D drug coverage in your pharmacy plan documents for more information."

## 2022-05-24 DIAGNOSIS — G8929 Other chronic pain: Secondary | ICD-10-CM | POA: Diagnosis not present

## 2022-05-24 DIAGNOSIS — R110 Nausea: Secondary | ICD-10-CM | POA: Diagnosis not present

## 2022-05-24 DIAGNOSIS — Z8719 Personal history of other diseases of the digestive system: Secondary | ICD-10-CM | POA: Diagnosis not present

## 2022-05-24 DIAGNOSIS — R197 Diarrhea, unspecified: Secondary | ICD-10-CM | POA: Diagnosis not present

## 2022-05-24 DIAGNOSIS — R109 Unspecified abdominal pain: Secondary | ICD-10-CM | POA: Diagnosis not present

## 2022-05-26 ENCOUNTER — Inpatient Hospital Stay: Payer: Medicare Other

## 2022-05-26 ENCOUNTER — Ambulatory Visit: Payer: Medicare Other

## 2022-05-26 ENCOUNTER — Ambulatory Visit: Payer: Medicare Other | Admitting: Family

## 2022-05-27 ENCOUNTER — Other Ambulatory Visit: Payer: Self-pay | Admitting: Family Medicine

## 2022-05-27 ENCOUNTER — Ambulatory Visit (INDEPENDENT_AMBULATORY_CARE_PROVIDER_SITE_OTHER): Payer: Medicare Other | Admitting: Family

## 2022-05-27 DIAGNOSIS — R1013 Epigastric pain: Secondary | ICD-10-CM | POA: Diagnosis not present

## 2022-05-27 DIAGNOSIS — K529 Noninfective gastroenteritis and colitis, unspecified: Secondary | ICD-10-CM | POA: Insufficient documentation

## 2022-05-27 DIAGNOSIS — Z4502 Encounter for adjustment and management of automatic implantable cardiac defibrillator: Secondary | ICD-10-CM | POA: Diagnosis not present

## 2022-05-27 LAB — MAGNESIUM: Magnesium: 1.8 mg/dL (ref 1.5–2.5)

## 2022-05-27 NOTE — Assessment & Plan Note (Signed)
Uncontrolled.  Had negative KUB. She is s/p cholecystectomy.  ? H Pylori, ? Gastritis, ?PUD. She has appointment with GI tomorrow. Will defer further work up/management to GI.

## 2022-05-27 NOTE — Assessment & Plan Note (Signed)
Suspect that MagOx is a contributing factor.  Unfortunately, she needs this for cardiac reasons.  Magnesium was low in the ED. Will recheck today.

## 2022-05-27 NOTE — Progress Notes (Signed)
Subjective:   By signing my name below, I, Shehryar Baig, attest that this documentation has been prepared under the direction and in the presence of Debbrah Alar, NP. 05/27/2022      [name], female    DOB: 08-05-1956, 66 y.o.   MRN: [id number]  Chief Complaint  Patient presents with   Follow-up    Here for ed follow up    HPI Patient is in today for a ED follow up visit.   ED visit- She was admitted to the ED on 05/24/2022 for chronic abdominal pain and diarrhea. She tested negative for C.Diff while in the ED. She tested negative for UTI as well. Lipase was WNL.  Diarrhea/abdominal pain- She complains of diarrhea and abdominal pain for over a year. Her symptoms worsens slightly after eating. She reports her stools vary from being soft or runny. She has decreased appetite due to her frequent diarrhea. She has 5-6 bowel movements daily. She has an upcomming appointment with a GI specialist tomorrow. She reports trying imodium to manage her diarrhea and found no relief. She is currently taking 0.125 mg Levsin sl PRN, 1 g sucralfate.  Multiple myeloma- She has a history of multiple myeloma and has not received treatment in the past 18 months due to her good results. Her oncologist does not think her GI symptoms are related to her multiple myeloma.    Health Maintenance Due  Topic Date Due   Zoster Vaccines- Shingrix (1 of 2) Never done   COVID-19 Vaccine (5 - Booster for Pfizer series) 05/12/2021   Pneumonia Vaccine 59+ Years old (2 - PPSV23 or PCV20) 06/02/2022    Past Medical History:  Diagnosis Date   Abnormal SPEP 07/26/2016   Arthritis    knees, hands   Back pain 07/14/2016   Bowel obstruction ([place]) 11/2014   C. 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   Low back pain 07/14/2016   Monoclonal gammopathy of unknown significance (MGUS) 09/16/2016   Multiple myeloma ([place]) 07/20/2018   Multiple myeloma not having achieved remission ([place]) 07/20/2018   Myalgia 12/31/2016   Obstructive sleep apnea 03/31/2015   SOB (shortness of breath) 04/09/2016   Stroke ([place])    TIAs   UTI (urinary tract infection)     Past Surgical History:  Procedure Laterality Date   ABDOMINAL HYSTERECTOMY     menorraghia, 2006, total   CHOLECYSTECTOMY     COLONOSCOPY  2018   [facility] per patient   ESOPHAGOGASTRODUODENOSCOPY  2018   [facility]    KNEE SURGERY     right, repair torn torn cartialage   PACEMAKER INSERTION  08/2014   TONSILLECTOMY     TUBAL LIGATION      Family History  Problem Relation Age of Onset   Diabetes Mother    Hypertension Mother    Heart disease Mother        s/p 1 stent   Hyperlipidemia Mother    Arthritis Mother    Cancer Father        COLON   Colon cancer Father 95   Irritable bowel syndrome Sister  Hyperlipidemia Daughter    Hypertension Daughter    Hypertension Maternal Grandmother    Arthritis Maternal Grandmother    Heart disease Maternal Grandfather        MI   Hypertension Maternal Grandfather    Arthritis Maternal Grandfather    Hypertension Son    Esophageal cancer Neg Hx     Social History   Socioeconomic History   Marital status: Divorced    Spouse name: [name]   Number of children: 3   Years of education: [name]   Highest education level: High school graduate  Occupational History   [name]  Tobacco Use   Smoking status: Never   Smokeless tobacco: Never  Vaping Use   Vaping Use: Never used  Substance and Sexual Activity   Alcohol use: No   Drug use: No   Sexual activity: [name]  Other Topics Concern   [name]  Social History Narrative   Lives at  home alone   Retired   Caffeine: coffee   Social Determinants of Health   Financial Resource Strain: Low Risk  (10/17/2021)   Overall Financial Resource Strain (CARDIA)    Difficulty of Paying Living Expenses: Not hard at all  Food Insecurity: No Food Insecurity (10/17/2021)   Hunger Vital Sign    Worried About Running Out of Food in the Last Year: Never true    Ran Out of Food in the Last Year: Never true  Transportation Needs: No Transportation Needs (10/17/2021)   PRAPARE - Transportation    Lack of Transportation (Medical): No    Lack of Transportation (Non-Medical): No  Physical Activity: Insufficiently Active (10/17/2021)   Exercise Vital Sign    Days of Exercise per Week: 2 days    Minutes of Exercise per Session: 30 min  Stress: No Stress Concern Present (10/17/2021)   [employer] - Occupational Stress Questionnaire    Feeling of Stress : Not at all  Social Connections: Moderately Isolated (10/17/2021)   Social Connection and Isolation Panel [NHANES]    Frequency of Communication with Friends and Family: More than three times a week    Frequency of Social Gatherings with Friends and Family: More than three times a week    Attends Religious Services: More than 4 times per year    Active Member of Clubs or Organizations: No    Attends Club or Organization Meetings: Never    Marital Status: Divorced  Intimate Partner Violence: Not At Risk (10/17/2021)   Humiliation, Afraid, Rape, and Kick questionnaire    Fear of Current or Ex-Partner: No    Emotionally Abused: No    Physically Abused: No    Sexually Abused: No    Outpatient Medications Prior to Visit  Medication Sig Dispense Refill   acetaminophen (TYLENOL) 500 MG tablet Take 500 mg by mouth in the morning.     albuterol (VENTOLIN HFA) 108 (90 Base) MCG/ACT inhaler Inhale 2 puffs into the lungs every 6 (six) hours as needed for wheezing or shortness of breath. 18 g 5   amoxicillin (AMOXIL) 500  MG tablet Take 1 tablet (500 mg total) by mouth 2 (two) times daily. 14 tablet 0   aspirin EC 81 MG tablet Take 1 tablet (81 mg total) by mouth daily. 90 tablet 1   carvedilol (COREG) 25 MG tablet Take 1 tablet (25 mg total) by mouth 2 (two) times daily with a meal. 180 tablet 1   cetirizine (  ZYRTEC) 10 MG tablet Take 10 mg by mouth daily as needed for allergies.     Cholecalciferol (VITAMIN D-3) 25 MCG (1000 UT) CAPS Take 1 capsule (1,000 Units total) by mouth daily. 60 capsule 2   colestipol (COLESTID) 1 g tablet Take 2 tablets (2 g total) by mouth every 12 (twelve) hours. 120 tablet 1   fluticasone (FLONASE) 50 MCG/ACT nasal spray Place 2 sprays into both nostrils daily. (Patient taking differently: Place 2 sprays into both nostrils every evening.) 16 g 0   furosemide (LASIX) 20 MG tablet Take 1 tablet by mouth daily as needed for fluid.     hyoscyamine (LEVSIN SL) 0.125 MG SL tablet Place 1 tablet (0.125 mg total) under the tongue every 4 (four) hours as needed. 30 tablet 1   ibuprofen (ADVIL) 800 MG tablet Take 1 tablet (800 mg total) by mouth 3 (three) times daily. (Patient taking differently: Take 800 mg by mouth every 8 (eight) hours as needed for moderate pain.) 21 tablet 0   loratadine (CLARITIN) 10 MG tablet Take 1 tablet (10 mg total) by mouth daily. (Patient taking differently: Take 10 mg by mouth daily as needed for allergies.) 30 tablet 11   losartan (COZAAR) 50 MG tablet Take 50 mg by mouth daily.     magnesium oxide (MAG-OX) 400 MG tablet Take 1 tablet (400 mg total) by mouth 2 (two) times daily. Hold if you have diarrhea 60 tablet 1   Multiple Vitamins-Minerals (MULTIVITAMIN ADULT PO) Take 1 tablet by mouth daily.     oxyCODONE-acetaminophen (PERCOCET/ROXICET) 5-325 MG tablet Take 1 tablet by mouth every 6 (six) hours as needed for severe pain.     potassium chloride SA (KLOR-CON M) 20 MEQ tablet Take 1 tablet (20 mEq total) by mouth every Monday, Wednesday, and Friday. Please follow  up with your doctor to monitor potassium level 30 tablet 0   Probiotic Product (PROBIOTIC PO) Take 1 capsule by mouth daily.     spironolactone (ALDACTONE) 25 MG tablet Take 12.5 mg by mouth daily.     sucralfate (CARAFATE) 1 g tablet Take 1 tablet (1 g total) by mouth 4 (four) times daily -  with meals and at bedtime. 120 tablet 2   No facility-administered medications prior to visit.    Allergies  Allergen Reactions   Benazepril Anaphylaxis, Swelling and Hives    angioedema Throat and lip swelling   Ondansetron Hcl Hives    Redness and hives post IV admin on 07/05/17   Codeine Nausea And Vomiting   Morphine Hives    Redness and hives noted post IV admin on 07/05/17    Review of Systems  Constitutional:        (+)decrease appetite from diarrhea  Gastrointestinal:  Positive for abdominal pain and diarrhea.       Objective:    Physical Exam Constitutional:      General: She is not in acute distress.    Appearance: Normal appearance. She is not ill-appearing.  HENT:     Head: Normocephalic and atraumatic.     Right Ear: External ear normal.     Left Ear: External ear normal.  Eyes:     Extraocular Movements: Extraocular movements intact.     Pupils: Pupils are equal, round, and reactive to light.  Cardiovascular:     Rate and Rhythm: Normal rate and regular rhythm.     Heart sounds: Normal heart sounds. No murmur heard.    No gallop.  Pulmonary:  Effort: Pulmonary effort is normal. No respiratory distress.     Breath sounds: Normal breath sounds. No wheezing or rales.  Abdominal:     General: Bowel sounds are normal.     Palpations: Abdomen is soft.     Tenderness: There is abdominal tenderness in the epigastric area.  Skin:    General: Skin is warm and dry.  Neurological:     Mental Status: She is alert and oriented to person, place, and time.  Psychiatric:        Judgment: Judgment normal.     BP (!) 144/85   Pulse 84   Temp 97.6 F (36.4 C) (Oral)    Resp 16   Wt 196 lb (88.9 kg)   SpO2 99%   BMI 32.62 kg/m  Wt Readings from Last 3 Encounters:  05/27/22 196 lb (88.9 kg)  05/21/22 197 lb 12.8 oz (89.7 kg)  05/01/22 196 lb 1.9 oz (89 kg)       Assessment & Plan:   Problem List Items Addressed This Visit       Unprioritized   Hypomagnesemia - Primary   Relevant Orders   Magnesium   Epigastric pain    Uncontrolled.  Had negative KUB. She is s/p cholecystectomy.  ? H Pylori, ? Gastritis, ?PUD. She has appointment with GI tomorrow. Will defer further work up/management to GI.        Chronic diarrhea    Suspect that MagOx is a contributing factor.  Unfortunately, she needs this for cardiac reasons.  Magnesium was low in the ED. Will recheck today.          No orders of the defined types were placed in this encounter.   I, Melissa S O'Sullivan, NP, personally preformed the services described in this documentation.  All medical record entries made by the scribe were at my direction and in my presence.  I have reviewed the chart and discharge instructions (if applicable) and agree that the record reflects my personal performance and is accurate and complete. 05/27/2022   I,Shehryar Baig,acting as a scribe for Melissa S O'Sullivan, NP.,have documented all relevant documentation on the behalf of Melissa S O'Sullivan, NP,as directed by  Melissa S O'Sullivan, NP while in the presence of Melissa S O'Sullivan, NP.   Melissa S O'Sullivan, NP  

## 2022-05-28 ENCOUNTER — Ambulatory Visit (INDEPENDENT_AMBULATORY_CARE_PROVIDER_SITE_OTHER): Payer: Medicare Other | Admitting: Gastroenterology

## 2022-05-28 ENCOUNTER — Encounter: Payer: Self-pay | Admitting: Gastroenterology

## 2022-05-28 ENCOUNTER — Inpatient Hospital Stay: Payer: Medicare Other

## 2022-05-28 ENCOUNTER — Telehealth: Payer: Self-pay | Admitting: *Deleted

## 2022-05-28 ENCOUNTER — Other Ambulatory Visit: Payer: Self-pay

## 2022-05-28 ENCOUNTER — Encounter: Payer: Self-pay | Admitting: Family

## 2022-05-28 ENCOUNTER — Inpatient Hospital Stay: Payer: Medicare Other | Attending: Hematology & Oncology

## 2022-05-28 ENCOUNTER — Inpatient Hospital Stay (HOSPITAL_BASED_OUTPATIENT_CLINIC_OR_DEPARTMENT_OTHER): Payer: Medicare Other | Admitting: Family

## 2022-05-28 VITALS — BP 140/88 | HR 88 | Ht 65.0 in | Wt 196.4 lb

## 2022-05-28 VITALS — BP 125/84 | HR 78 | Temp 98.0°F | Resp 17

## 2022-05-28 VITALS — BP 127/94 | HR 85 | Temp 98.0°F | Resp 18 | Ht 65.0 in | Wt 195.1 lb

## 2022-05-28 DIAGNOSIS — E860 Dehydration: Secondary | ICD-10-CM

## 2022-05-28 DIAGNOSIS — K56600 Partial intestinal obstruction, unspecified as to cause: Secondary | ICD-10-CM

## 2022-05-28 DIAGNOSIS — K580 Irritable bowel syndrome with diarrhea: Secondary | ICD-10-CM | POA: Diagnosis not present

## 2022-05-28 DIAGNOSIS — D500 Iron deficiency anemia secondary to blood loss (chronic): Secondary | ICD-10-CM

## 2022-05-28 DIAGNOSIS — C9000 Multiple myeloma not having achieved remission: Secondary | ICD-10-CM

## 2022-05-28 DIAGNOSIS — D801 Nonfamilial hypogammaglobulinemia: Secondary | ICD-10-CM

## 2022-05-28 DIAGNOSIS — R112 Nausea with vomiting, unspecified: Secondary | ICD-10-CM | POA: Diagnosis not present

## 2022-05-28 DIAGNOSIS — R1013 Epigastric pain: Secondary | ICD-10-CM

## 2022-05-28 DIAGNOSIS — K760 Fatty (change of) liver, not elsewhere classified: Secondary | ICD-10-CM | POA: Diagnosis not present

## 2022-05-28 DIAGNOSIS — Z4502 Encounter for adjustment and management of automatic implantable cardiac defibrillator: Secondary | ICD-10-CM | POA: Diagnosis not present

## 2022-05-28 DIAGNOSIS — I4729 Other ventricular tachycardia: Secondary | ICD-10-CM | POA: Diagnosis not present

## 2022-05-28 DIAGNOSIS — I509 Heart failure, unspecified: Secondary | ICD-10-CM | POA: Diagnosis not present

## 2022-05-28 HISTORY — DX: Iron deficiency anemia secondary to blood loss (chronic): D50.0

## 2022-05-28 LAB — CBC WITH DIFFERENTIAL (CANCER CENTER ONLY)
Abs Immature Granulocytes: 0.01 10*3/uL (ref 0.00–0.07)
Basophils Absolute: 0 10*3/uL (ref 0.0–0.1)
Basophils Relative: 1 %
Eosinophils Absolute: 0.1 10*3/uL (ref 0.0–0.5)
Eosinophils Relative: 2 %
HCT: 37.2 % (ref 36.0–46.0)
Hemoglobin: 11.8 g/dL — ABNORMAL LOW (ref 12.0–15.0)
Immature Granulocytes: 1 %
Lymphocytes Relative: 49 %
Lymphs Abs: 1.1 10*3/uL (ref 0.7–4.0)
MCH: 28.2 pg (ref 26.0–34.0)
MCHC: 31.7 g/dL (ref 30.0–36.0)
MCV: 88.8 fL (ref 80.0–100.0)
Monocytes Absolute: 0.2 10*3/uL (ref 0.1–1.0)
Monocytes Relative: 8 %
Neutro Abs: 0.8 10*3/uL — ABNORMAL LOW (ref 1.7–7.7)
Neutrophils Relative %: 39 %
Platelet Count: 126 10*3/uL — ABNORMAL LOW (ref 150–400)
RBC: 4.19 MIL/uL (ref 3.87–5.11)
RDW: 13.6 % (ref 11.5–15.5)
WBC Count: 2.2 10*3/uL — ABNORMAL LOW (ref 4.0–10.5)
nRBC: 0 % (ref 0.0–0.2)

## 2022-05-28 LAB — CMP (CANCER CENTER ONLY)
ALT: 15 U/L (ref 0–44)
AST: 29 U/L (ref 15–41)
Albumin: 4 g/dL (ref 3.5–5.0)
Alkaline Phosphatase: 49 U/L (ref 38–126)
Anion gap: 8 (ref 5–15)
BUN: 16 mg/dL (ref 8–23)
CO2: 26 mmol/L (ref 22–32)
Calcium: 9.7 mg/dL (ref 8.9–10.3)
Chloride: 104 mmol/L (ref 98–111)
Creatinine: 0.67 mg/dL (ref 0.44–1.00)
GFR, Estimated: >60 mL/min (ref >60)
Glucose, Bld: 165 mg/dL — ABNORMAL HIGH (ref 70–99)
Potassium: 3.8 mmol/L (ref 3.5–5.1)
Sodium: 138 mmol/L (ref 135–145)
Total Bilirubin: 0.5 mg/dL (ref 0.3–1.2)
Total Protein: 8.9 g/dL — ABNORMAL HIGH (ref 6.5–8.1)

## 2022-05-28 LAB — LACTATE DEHYDROGENASE: LDH: 233 U/L — ABNORMAL HIGH (ref 98–192)

## 2022-05-28 MED ORDER — SODIUM CHLORIDE 0.9 % IV SOLN: Freq: Once | INTRAVENOUS | Status: AC

## 2022-05-28 MED ORDER — PANTOPRAZOLE SODIUM 40 MG PO TBEC: 40.0000 mg | DELAYED_RELEASE_TABLET | Freq: Two times a day (BID) | ORAL | 4 refills | Status: DC

## 2022-05-28 NOTE — Telephone Encounter (Signed)
Per 05/28/22 los - gave upcoming appointments - confirmed

## 2022-05-28 NOTE — Addendum Note (Signed)
Addended by: [name] on: 05/28/2022 04:46 PM   Modules accepted: Orders

## 2022-05-28 NOTE — Progress Notes (Signed)
Hematology and Oncology Follow Up Visit  Tami [name] [id number] 1956/06/03 66 y.o. 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 is here today for follow-up. She is having "burning" in her stomach, n/v/d and loss of appetite now for almost a year.  She was able to see Dr. Lyndel Safe earlier today and he plans to do  CT scan next week followed by EGD to further evaluate.  She has not noted any obvious blood loss. No bruising or petechiae.  No fever, chills, cough, rash, dizziness, SOB, chest pain, palpitations or changes in bladder habits.  Last month her M-spike was 2.0 g/dL, IgG level was 2,945 mg/dL and kappa light chains 8.76 mg/dL.  No swelling, tenderness, numbness or tingling in her extremities.  No falls or syncope.  She is doing her best to stay well hydrated. Her weight is stable at 196 lbs.   ECOG Performance Status: 1 - Symptomatic but completely ambulatory  Medications:  Allergies as of 05/28/2022       Reactions   Benazepril Anaphylaxis, Swelling, Hives   angioedema Throat and lip swelling   Ondansetron Hcl Hives   Redness and hives post IV admin on 07/05/17   Codeine Nausea And Vomiting   Morphine Hives   Redness and hives noted post IV admin on 07/05/17        Medication List        Accurate as of May 28, 2022  1:30 PM. If you have any questions, ask your nurse or doctor.          STOP taking these medications    amoxicillin 500 MG tablet Commonly known as: AMOXIL Stopped by: Jackquline Denmark, MD       TAKE these medications    acetaminophen 500 MG tablet Commonly  known as: TYLENOL Take 500 mg by mouth in the morning.   albuterol 108 (90 Base) MCG/ACT inhaler Commonly known as: VENTOLIN HFA Inhale 2 puffs into the lungs every 6 (six) hours as needed for wheezing or shortness of breath.   aspirin EC 81 MG tablet Take 1 tablet (81 mg total) by mouth daily.   carvedilol 25 MG tablet Commonly known as: COREG Take 1 tablet (25 mg total) by mouth 2 (two) times daily with a meal.   cetirizine 10 MG tablet Commonly known as: ZYRTEC Take 10 mg by mouth daily as needed for allergies.   colestipol 1 g tablet Commonly known as: COLESTID Take 2 tablets (2 g total) by mouth every 12 (twelve) hours.   fluticasone 50 MCG/ACT nasal spray Commonly known as: FLONASE Place 2 sprays into both nostrils daily. What changed: when to take this   furosemide 20 MG tablet Commonly known as: LASIX Take 1 tablet by mouth daily as needed for fluid.   hyoscyamine 0.125 MG SL tablet Commonly known as: LEVSIN SL Place 1 tablet (0.125 mg total) under the tongue every 4 (four) hours as needed.   ibuprofen 800 MG tablet Commonly known as: ADVIL Take 1 tablet (800 mg total) by mouth 3 (three)  times daily. What changed:  when to take this reasons to take this   loratadine 10 MG tablet Commonly known as: CLARITIN Take 1 tablet (10 mg total) by mouth daily. What changed:  when to take this reasons to take this   losartan 50 MG tablet Commonly known as: COZAAR Take 50 mg by mouth daily.   magnesium oxide 400 MG tablet Commonly known as: MAG-OX Take 1 tablet (400 mg total) by mouth 2 (two) times daily. Hold if you have diarrhea   MULTIVITAMIN ADULT PO Take 1 tablet by mouth daily.   oxyCODONE-acetaminophen 5-325 MG tablet Commonly known as: PERCOCET/ROXICET Take 1 tablet by mouth every 6 (six) hours as needed for severe pain.   pantoprazole 40 MG tablet Commonly known as: PROTONIX Take 1 tablet (40 mg total) by mouth 2 (two) times daily. Started by:  Jackquline Denmark, MD   potassium chloride SA 20 MEQ tablet Commonly known as: KLOR-CON M TAKE 1 TABLET BY MOUTH EVERY MONDAY, [place], AND FRIDAY. FOLLOW UP WITH MDTO MONITOR POTASSIUM LEVEL   PROBIOTIC PO Take 1 capsule by mouth daily.   spironolactone 25 MG tablet Commonly known as: ALDACTONE Take 12.5 mg by mouth daily.   sucralfate 1 g tablet Commonly known as: Carafate Take 1 tablet (1 g total) by mouth 4 (four) times daily -  with meals and at bedtime.   Vitamin D-3 25 MCG (1000 UT) Caps Take 1 capsule (1,000 Units total) by mouth daily.        Allergies:  Allergies  Allergen Reactions   Benazepril Anaphylaxis, Swelling and Hives    angioedema Throat and lip swelling   Ondansetron Hcl Hives    Redness and hives post IV admin on 07/05/17   Codeine Nausea And Vomiting   Morphine Hives    Redness and hives noted post IV admin on 07/05/17    Past Medical History, Surgical history, Social history, and Family History were reviewed and updated.  Review of Systems: All other 10 point review of systems is negative.   Physical Exam:  vitals were not taken for this visit.   Wt Readings from Last 3 Encounters:  05/28/22 196 lb 6 oz (89.1 kg)  05/27/22 196 lb (88.9 kg)  05/21/22 197 lb 12.8 oz (89.7 kg)    Ocular: Sclerae unicteric, pupils equal, round and reactive to light Ear-nose-throat: Oropharynx clear, dentition fair Lymphatic: No cervical or supraclavicular adenopathy Lungs no rales or rhonchi, good excursion bilaterally Heart regular rate and rhythm, no murmur appreciated Abd soft, nontender, positive bowel sounds MSK no focal spinal tenderness, no joint edema Neuro: non-focal, well-oriented, appropriate affect Breasts: Deferred   Lab Results  Component Value Date   WBC 2.2 (L) 05/28/2022   HGB 11.8 (L) 05/28/2022   HCT 37.2 05/28/2022   MCV 88.8 05/28/2022   PLT 126 (L) 05/28/2022   Lab Results  Component Value Date   FERRITIN 449 (H) 05/01/2022    IRON 41 05/01/2022   TIBC 368 05/01/2022   UIBC 327 05/01/2022   IRONPCTSAT 11 05/01/2022   Lab Results  Component Value Date   RETICCTPCT 0.8 07/16/2016   RBC 4.19 05/28/2022   RETICCTABS 32,640 07/16/2016   Lab Results  Component Value Date   KPAFRELGTCHN 87.6 (H) 05/01/2022   LAMBDASER 5.1 (L) 05/01/2022   KAPLAMBRATIO 17.18 (H) 05/01/2022   Lab Results  Component Value Date   IGGSERUM 2,945 (H) 05/01/2022   IGA 142 05/01/2022   IGMSERUM 115 05/01/2022   Lab Results  Component Value Date   TOTALPROTELP 8.2 05/01/2022   ALBUMINELP 3.3 05/01/2022   A1GS 0.2 05/01/2022   A2GS 1.1 (H) 05/01/2022   BETS 1.0 05/01/2022   BETA2SER 0.5 07/16/2016   GAMS 2.5 (H) 05/01/2022   MSPIKE 2.0 (H) 05/01/2022   SPEI Comment 05/01/2022     Chemistry      Component Value Date/Time   NA 139 05/21/2022 1111   NA 143 11/08/2017 1127   NA 140 12/31/2016 1000   K 4.2 05/21/2022 1111   K 4.0 11/08/2017 1127   K 3.6 12/31/2016 1000   CL 106 05/21/2022 1111   CL 107 11/08/2017 1127   CO2 27 05/21/2022 1111   CO2 25 11/08/2017 1127   CO2 24 12/31/2016 1000   BUN 18 05/21/2022 1111   BUN 19 11/08/2017 1127   BUN 22.3 12/31/2016 1000   CREATININE 0.68 05/21/2022 1111   CREATININE 0.79 05/01/2022 1511   CREATININE 0.9 11/08/2017 1127   CREATININE 0.8 12/31/2016 1000      Component Value Date/Time   CALCIUM 9.3 05/21/2022 1111   CALCIUM 9.9 11/08/2017 1127   CALCIUM 9.5 12/31/2016 1000   ALKPHOS 51 05/21/2022 1111   ALKPHOS 44 11/08/2017 1127   ALKPHOS 51 12/31/2016 1000   AST 26 05/21/2022 1111   AST 36 05/01/2022 1511   AST 28 12/31/2016 1000   ALT 13 05/21/2022 1111   ALT 15 05/01/2022 1511   ALT 36 11/08/2017 1127   ALT 16 12/31/2016 1000   BILITOT 0.3 05/21/2022 1111   BILITOT 0.3 05/01/2022 1511   BILITOT 0.46 12/31/2016 1000       Impression and Plan: Ms. Mcmanigal is a very pleasant 66 yo African American female with a previous IgG kappa MGUS with progression to  myeloma.  Protein studies are pending.  We will give her IV fluids today per her request.  Follow-up in 6 weeks.   Lottie Dawson, NP 7/20/20231:30 PM

## 2022-05-28 NOTE — Patient Instructions (Signed)
If you are age 68 or older, your body mass index should be between 23-30. Your Body mass index is 32.68 kg/m. If this is out of the aforementioned range listed, please consider follow up with your Primary Care Provider.  If you are age 65 or younger, your body mass index should be between 19-25. Your Body mass index is 32.68 kg/m. If this is out of the aformentioned range listed, please consider follow up with your Primary Care Provider.   ________________________________________________________  [facility] providers would like to encourage you to use [facility] to communicate with providers for non-urgent requests or questions.  Due to long hold times on the telephone, sending your provider a message by [facility] may be a faster and more efficient way to get a response.  Please allow 48 business hours for a response.  Please remember that this is for non-urgent requests.  _______________________________________________________  We have sent the following medications to your pharmacy for you to pick up at your convenience: Protonix  Continue Colestipol   Please call in August to see about having a repeat EGD at [facility]. We currently do not have any dates.  You have been scheduled for a CT scan of the abdomen and pelvis at [facility] ([place], [place], [place] [address]).   You are scheduled on Friday 06-05-2022 at 11am. Appointment is at 1230pm You should arrive 30 minutes prior to your appointment time for registration. Please follow the written instructions below on the day of your exam:  WARNING: IF YOU ARE ALLERGIC TO IODINE/X-RAY DYE, PLEASE NOTIFY RADIOLOGY IMMEDIATELY AT [phone]! YOU WILL BE GIVEN A 13 HOUR PREMEDICATION PREP.  1) Do not eat or drink anything after 7am (4 hours prior to your test)   You may take any medications as prescribed with a small amount of water, if necessary. If you take any of the following medications: METFORMIN,  GLUCOPHAGE, GLUCOVANCE, AVANDAMET, RIOMET, FORTAMET, [place] MET, JANUMET, GLUMETZA or METAGLIP, you MAY be asked to HOLD this medication 48 hours AFTER the exam.  The purpose of you drinking the oral contrast is to aid in the visualization of your intestinal tract. The contrast solution may cause some diarrhea. Depending on your individual set of symptoms, you may also receive an intravenous injection of x-ray contrast/dye. Plan on being at [facility] for 60 minutes or longer, depending on the type of exam you are having performed.  If you have any questions regarding your exam or if you need to reschedule, you may call the CT department at [phone] between the hours of 8:00 am and 5:00 pm, Monday-Friday.  ________________________________________________________________________  Thank you,  Dr. Jackquline Denmark

## 2022-05-28 NOTE — Progress Notes (Signed)
Chief Complaint: FU  Referring Provider:  Mosie Lukes, MD      ASSESSMENT AND PLAN;   #1. Epi pain with N/V. ?etiology ?functional. Neg prev extensive GI WU as below. Neg GES Feb 2023  #2. H/O Recent PSBO d/t adhesions. CT 04/2022- Stable 1.0 cm polypoid appearing area of soft tissue attenuation within the lumen of a loop of distal small bowel  #3. IBS-D. Previous H/O constipation. Neg colon with TI intubation and random colon Bx Dec 2022. Neg stool studies, multiple CTs.  Could have bile acid diarrhea (s/p cholecystectomy) or d/t meds (Mg)  #3. Fatty liver with Nl LFTs.  No liver cirrhosis.  #3.  Multiple comorbid conditions include anxiety, multiple myeloma, HTN, CHF  s/p AICD, OSA, LBBB, H/O TIA.  Plan:  -CTE to eval "distal SB polypoid lesion". -Continue Colestipol (1g/tab) 2g BID -Restart Protonix 40 bid  #180), 4RF -Reasonable to repeat EGD at [facility], after cardio clearence by Dr Ola Spurr -Continue magnesium as per cardiology.   HPI:    Tami [name] is a 66 y.o. 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  Adm to [facility] 6/19/-04/30/2022 with PSBO thought to be d/t adhesions, managed conservatively.  CT did show a 1 cm polypoid appearing area of soft tissue attenuation within the lumen of the distal small bowel.  It was stable as compared to the previous CT.  She has been seen in ED since discharge x 2 with generalized abdominal pain and diarrhea.  She has longstanding history of chronic diarrhea which is made worse with magnesium supplements.  Magnesium cannot be discontinued as per cardiology.  Most recent magnesium level was 1.8 yesterday (with 600 mg/day, reduced from 800/day).  She continues to have chronic abdominal pain, now mainly in epigastric area which is tender to touch.  She would occasionally have nausea/vomiting.  She has stopped taking Protonix-for unknown  reasons. She is taking Carafate 1 g p.o. 4 times daily x 2 more weeks.  Diarrhea 6-7/day with occ nocturnal symptoms. MgO 3/day Had fluids at [facility] ED sunday Neg stool studies GI path and c diff  Has appt with Cardio Dr Ola Spurr tomorrow- had pacemaker.   Chronic N/V/Abdo pain, neg EGD with gastric and SB Bx 09/18/2021. Had negative gastric emptying scan.  Recent Adm to [facility] 10/2021 with N/V/D.  Negative stool studies including C. difficile.  Neg CTAP, Colon with TI with neg random colon biopsies 11/07/21.   Wt Readings from Last 3 Encounters:  05/28/22 196 lb 6 oz (89.1 kg)  05/27/22 196 lb (88.9 kg)  05/21/22 197 lb 12.8 oz (89.7 kg)      Past GI work-up: Previously followed by Dr. Reather Laurence.  Has been to multiple gastroenterologists and with multiple ED visits.  EGD 09/18/2021 (Dr Dorrene German): Normal.  Negative gastric and small bowel biopsies for celiac.  Prev: H/O eso stricture s/p EGD with dil 58Fr 07/30/2017 (Dr Reather Laurence), Bx- neg EoE/neg [facility]. Rpt EGD 07/14/2018: neg.   Colonoscopy with TI intubation 11/07/2021 Dr Dorene Grebe.  Neg with negative random colon biopsies. Mod internal hemorrhoids.  CT AP with contrast 04/27/2022 1. Distal partial small bowel obstruction with a transition zone seen within the right lower quadrant. 2. Stable 1.0 cm polypoid appearing area of soft tissue attenuation within the lumen of a loop of distal small bowel. Given its stability, this may be benign in etiology, however, GI consult  is recommended. 3. Stable bilateral simple renal cysts. 4. Status post cholecystectomy. 5. Small, stable hepatic cyst or hemangioma.  CT AP without contrast 10/16/2021: neg  GES 07/2021: neg  Feb 2023: neg  UGI series 06/13/2021: neg  Past Medical History:  Diagnosis Date   Abnormal SPEP 07/26/2016   Arthritis    knees, hands   Back pain 07/14/2016   Bowel obstruction ([place]) 11/2014   C. 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   Low back pain 07/14/2016   Monoclonal gammopathy of unknown significance (MGUS) 09/16/2016   Multiple myeloma ([place]) 07/20/2018   Multiple myeloma not having achieved remission ([place]) 07/20/2018   Myalgia 12/31/2016   Obstructive sleep apnea 03/31/2015   SOB (shortness of breath) 04/09/2016   Stroke ([place])    TIAs   UTI (urinary tract infection)     Past Surgical History:  Procedure Laterality Date   ABDOMINAL HYSTERECTOMY     menorraghia, 2006, total   CHOLECYSTECTOMY     COLONOSCOPY  2018   [facility] per patient   ESOPHAGOGASTRODUODENOSCOPY  2018   [facility]    KNEE SURGERY     right, repair torn torn cartialage   PACEMAKER INSERTION  08/2014   TONSILLECTOMY     TUBAL LIGATION      Family History  Problem Relation Age of Onset   Diabetes Mother    Hypertension Mother    Heart disease Mother        s/p 1 stent   Hyperlipidemia Mother    Arthritis Mother    Cancer Father        COLON   Colon cancer Father 33   Irritable bowel syndrome Sister    Hyperlipidemia Daughter    Hypertension Daughter    Hypertension Maternal Grandmother    Arthritis Maternal Grandmother    Heart disease Maternal Grandfather        MI   Hypertension Maternal Grandfather    Arthritis Maternal Grandfather    Hypertension Son    Esophageal cancer Neg Hx     Social History   Tobacco Use   Smoking status: Never   Smokeless tobacco: Never  Vaping Use   Vaping Use: Never used  Substance Use Topics   Alcohol use: No   Drug use: No  . Allergies as of 05/28/2022       Reactions   Benazepril Anaphylaxis, Swelling, Hives   angioedema Throat and lip swelling   Ondansetron Hcl Hives   Redness and hives  post IV admin on 07/05/17   Codeine Nausea And Vomiting   Morphine Hives   Redness and hives noted post IV admin on 07/05/17        Medication List        Accurate as of May 28, 2022 11:04 AM. If you have any questions, ask your nurse or doctor.          STOP taking these medications    amoxicillin 500 MG tablet Commonly known as: AMOXIL Stopped by: Jackquline Denmark, MD       TAKE these medications    acetaminophen 500 MG tablet Commonly known as: TYLENOL Take  500 mg by mouth in the morning.   albuterol 108 (90 Base) MCG/ACT inhaler Commonly known as: VENTOLIN HFA Inhale 2 puffs into the lungs every 6 (six) hours as needed for wheezing or shortness of breath.   aspirin EC 81 MG tablet Take 1 tablet (81 mg total) by mouth daily.   carvedilol 25 MG tablet Commonly known as: COREG Take 1 tablet (25 mg total) by mouth 2 (two) times daily with a meal.   cetirizine 10 MG tablet Commonly known as: ZYRTEC Take 10 mg by mouth daily as needed for allergies.   colestipol 1 g tablet Commonly known as: COLESTID Take 2 tablets (2 g total) by mouth every 12 (twelve) hours.   fluticasone 50 MCG/ACT nasal spray Commonly known as: FLONASE Place 2 sprays into both nostrils daily. What changed: when to take this   furosemide 20 MG tablet Commonly known as: LASIX Take 1 tablet by mouth daily as needed for fluid.   hyoscyamine 0.125 MG SL tablet Commonly known as: LEVSIN SL Place 1 tablet (0.125 mg total) under the tongue every 4 (four) hours as needed.   ibuprofen 800 MG tablet Commonly known as: ADVIL Take 1 tablet (800 mg total) by mouth 3 (three) times daily. What changed:  when to take this reasons to take this   loratadine 10 MG tablet Commonly known as: CLARITIN Take 1 tablet (10 mg total) by mouth daily. What changed:  when to take this reasons to take this   losartan 50 MG tablet Commonly known as: COZAAR Take 50 mg by mouth daily.   magnesium oxide  400 MG tablet Commonly known as: MAG-OX Take 1 tablet (400 mg total) by mouth 2 (two) times daily. Hold if you have diarrhea   MULTIVITAMIN ADULT PO Take 1 tablet by mouth daily.   oxyCODONE-acetaminophen 5-325 MG tablet Commonly known as: PERCOCET/ROXICET Take 1 tablet by mouth every 6 (six) hours as needed for severe pain.   potassium chloride SA 20 MEQ tablet Commonly known as: KLOR-CON M TAKE 1 TABLET BY MOUTH EVERY MONDAY, WEDNESDAY, AND FRIDAY. FOLLOW UP WITH MDTO MONITOR POTASSIUM LEVEL   PROBIOTIC PO Take 1 capsule by mouth daily.   spironolactone 25 MG tablet Commonly known as: ALDACTONE Take 12.5 mg by mouth daily.   sucralfate 1 g tablet Commonly known as: Carafate Take 1 tablet (1 g total) by mouth 4 (four) times daily -  with meals and at bedtime.   Vitamin D-3 25 MCG (1000 UT) Caps Take 1 capsule (1,000 Units total) by mouth daily.          Allergies  Allergen Reactions   Benazepril Anaphylaxis, Swelling and Hives    angioedema Throat and lip swelling   Ondansetron Hcl Hives    Redness and hives post IV admin on 07/05/17   Codeine Nausea And Vomiting   Morphine Hives    Redness and hives noted post IV admin on 07/05/17    Review of Systems:  Psychiatric/Behavioral: Has anxiety or depression     Physical Exam:    BP 140/88   Pulse 88   Ht 5' 5" (1.651 m)   Wt 196 lb 6 oz (89.1 kg)   SpO2 96%   BMI 32.68 kg/m  Filed Weights   05/28/22 1100  Weight: 196 lb 6 oz (89.1 kg)   Constitutional:  Well-developed, in no acute distress. Psychiatric: Normal mood and affect. Behavior is normal. HEENT:  Conjunctivae are normal. No scleral icterus. Cardiovascular: Normal rate, regular  rhythm. No edema Pulmonary/chest: Effort normal and breath sounds normal. No wheezing, rales or rhonchi. Abdominal: Soft, nondistended. Epi tender. Bowel sounds active throughout. There are no masses palpable. No hepatomegaly. Rectal:  defered Neurological: Alert and  oriented to person place and time. Skin: Skin is warm and dry. No rashes noted.   Carmell Austria, MD 05/28/2022, 11:04 AM  Cc: Mosie Lukes, MD

## 2022-05-29 ENCOUNTER — Ambulatory Visit: Payer: Medicare Other

## 2022-05-29 ENCOUNTER — Telehealth: Payer: Self-pay | Admitting: *Deleted

## 2022-05-29 DIAGNOSIS — Z4502 Encounter for adjustment and management of automatic implantable cardiac defibrillator: Secondary | ICD-10-CM | POA: Diagnosis not present

## 2022-05-29 DIAGNOSIS — I428 Other cardiomyopathies: Secondary | ICD-10-CM | POA: Diagnosis not present

## 2022-05-29 DIAGNOSIS — I10 Essential (primary) hypertension: Secondary | ICD-10-CM | POA: Diagnosis not present

## 2022-05-29 LAB — KAPPA/LAMBDA LIGHT CHAINS
Kappa free light chain: 80.7 mg/L — ABNORMAL HIGH (ref 3.3–19.4)
Kappa, lambda light chain ratio: 17.17 — ABNORMAL HIGH (ref 0.26–1.65)
Lambda free light chains: 4.7 mg/L — ABNORMAL LOW (ref 5.7–26.3)

## 2022-05-29 LAB — IGG, IGA, IGM
IgA: 148 mg/dL (ref 87–352)
IgG (Immunoglobin G), Serum: 3238 mg/dL — ABNORMAL HIGH (ref 586–1602)
IgM (Immunoglobulin M), Srm: 116 mg/dL (ref 26–217)

## 2022-05-29 NOTE — Telephone Encounter (Signed)
Pt notified per order of Dr. Marin Olp "that the iron level is on the low side. We need to give her a dose of IV iron."  Pt is appreciative of call and has no questions or concerns at this time.  Message sent to scheduling.

## 2022-05-29 NOTE — Progress Notes (Signed)
Venofer 300 mg supportive care plan entered per Dr. Antonieta Pert instructions.

## 2022-06-01 ENCOUNTER — Inpatient Hospital Stay: Payer: Medicare Other

## 2022-06-01 VITALS — BP 147/83 | HR 74 | Temp 98.1°F | Resp 17

## 2022-06-01 DIAGNOSIS — D500 Iron deficiency anemia secondary to blood loss (chronic): Secondary | ICD-10-CM

## 2022-06-01 DIAGNOSIS — D801 Nonfamilial hypogammaglobulinemia: Secondary | ICD-10-CM

## 2022-06-01 DIAGNOSIS — C9000 Multiple myeloma not having achieved remission: Secondary | ICD-10-CM | POA: Diagnosis not present

## 2022-06-01 DIAGNOSIS — E860 Dehydration: Secondary | ICD-10-CM

## 2022-06-01 MED ORDER — SODIUM CHLORIDE 0.9 % IV SOLN: Freq: Once | INTRAVENOUS | Status: AC

## 2022-06-01 MED ORDER — SODIUM CHLORIDE 0.9 % IV SOLN
300.0000 mg | Freq: Once | INTRAVENOUS | Status: AC
  Administered 2022-06-01: 300 mg via INTRAVENOUS
  Filled 2022-06-01: qty 300

## 2022-06-01 NOTE — Patient Instructions (Signed)

## 2022-06-01 NOTE — Progress Notes (Signed)
Pt declined to stay for post infusion observation period. Pt stated she has tolerated medication multiple times prior without difficulty. Pt aware to call clinic with any questions or concerns. Pt verbalized understanding and had no further questions.  ? ?

## 2022-06-04 LAB — PROTEIN ELECTROPHORESIS, SERUM, WITH REFLEX
A/G Ratio: 0.7 (ref 0.7–1.7)
Albumin ELP: 3.4 g/dL (ref 2.9–4.4)
Alpha-1-Globulin: 0.3 g/dL (ref 0.0–0.4)
Alpha-2-Globulin: 1 g/dL (ref 0.4–1.0)
Beta Globulin: 1.1 g/dL (ref 0.7–1.3)
Gamma Globulin: 2.6 g/dL — ABNORMAL HIGH (ref 0.4–1.8)
Globulin, Total: 5 g/dL — ABNORMAL HIGH (ref 2.2–3.9)
M-Spike, %: 2.1 g/dL — ABNORMAL HIGH
SPEP Interpretation: 0
Total Protein ELP: 8.4 g/dL (ref 6.0–8.5)

## 2022-06-04 LAB — IMMUNOFIXATION REFLEX, SERUM
IgA: 158 mg/dL (ref 87–352)
IgG (Immunoglobin G), Serum: 3000 mg/dL — ABNORMAL HIGH (ref 586–1602)
IgM (Immunoglobulin M), Srm: 134 mg/dL (ref 26–217)

## 2022-06-05 ENCOUNTER — Ambulatory Visit (HOSPITAL_COMMUNITY): Payer: Medicare Other

## 2022-06-08 ENCOUNTER — Telehealth: Payer: Self-pay

## 2022-06-08 ENCOUNTER — Other Ambulatory Visit: Payer: Self-pay

## 2022-06-08 DIAGNOSIS — C9000 Multiple myeloma not having achieved remission: Secondary | ICD-10-CM

## 2022-06-08 MED ORDER — AMOXICILLIN 500 MG PO TABS: 500.0000 mg | ORAL_TABLET | Freq: Two times a day (BID) | ORAL | 0 refills | Status: DC

## 2022-06-08 NOTE — Telephone Encounter (Signed)
Patient made aware and said she wrote it down and told her to call in 2 weeks if she hasn't received anything from [place] regarding her instructions

## 2022-06-08 NOTE — Addendum Note (Signed)
Addended by: Curlene Labrum E on: 06/08/2022 09:59 AM   Modules accepted: Orders

## 2022-06-08 NOTE — Telephone Encounter (Signed)
LVM again

## 2022-06-08 NOTE — Telephone Encounter (Signed)
EGD made 09-01-2022 at [facility] at 6 am  CTE scheduled for 06-12-2022 at 330pm NPO 4 hours prior. Real appt time is 5   Dr Blane Ohara fax [phone]   LVM for patient. Instructions mailed for EGD.  Cardiac Clearance faxed

## 2022-06-10 DIAGNOSIS — I517 Cardiomegaly: Secondary | ICD-10-CM | POA: Diagnosis not present

## 2022-06-12 ENCOUNTER — Ambulatory Visit (HOSPITAL_COMMUNITY): Payer: Medicare Other

## 2022-06-22 ENCOUNTER — Telehealth: Payer: Self-pay

## 2022-06-22 NOTE — Telephone Encounter (Signed)
Called patient. Number on file kept ringing   Letter sent to patient stating that she needs to call central scheduling to schedule her CTE that Dr Lyndel Safe requested since she missed 7-28 and 8-4

## 2022-06-24 NOTE — Telephone Encounter (Signed)
LVM for patient to call  Patient has been cleared to have procedure from Cardiology Dr. Need to see what patient will do about CTE as well

## 2022-06-25 ENCOUNTER — Inpatient Hospital Stay: Payer: Medicare Other | Attending: Hematology & Oncology

## 2022-06-25 ENCOUNTER — Other Ambulatory Visit: Payer: Self-pay

## 2022-06-25 DIAGNOSIS — C9000 Multiple myeloma not having achieved remission: Secondary | ICD-10-CM | POA: Diagnosis not present

## 2022-06-25 DIAGNOSIS — K529 Noninfective gastroenteritis and colitis, unspecified: Secondary | ICD-10-CM | POA: Diagnosis not present

## 2022-06-25 DIAGNOSIS — D500 Iron deficiency anemia secondary to blood loss (chronic): Secondary | ICD-10-CM

## 2022-06-25 LAB — CBC WITH DIFFERENTIAL (CANCER CENTER ONLY)
Abs Immature Granulocytes: 0 10*3/uL (ref 0.00–0.07)
Basophils Absolute: 0 10*3/uL (ref 0.0–0.1)
Basophils Relative: 1 %
Eosinophils Absolute: 0.1 10*3/uL (ref 0.0–0.5)
Eosinophils Relative: 2 %
HCT: 38.6 % (ref 36.0–46.0)
Hemoglobin: 12.3 g/dL (ref 12.0–15.0)
Immature Granulocytes: 0 %
Lymphocytes Relative: 43 %
Lymphs Abs: 0.9 10*3/uL (ref 0.7–4.0)
MCH: 28.4 pg (ref 26.0–34.0)
MCHC: 31.9 g/dL (ref 30.0–36.0)
MCV: 89.1 fL (ref 80.0–100.0)
Monocytes Absolute: 0.2 10*3/uL (ref 0.1–1.0)
Monocytes Relative: 7 %
Neutro Abs: 1 10*3/uL — ABNORMAL LOW (ref 1.7–7.7)
Neutrophils Relative %: 47 %
Platelet Count: 108 10*3/uL — ABNORMAL LOW (ref 150–400)
RBC: 4.33 MIL/uL (ref 3.87–5.11)
RDW: 13.8 % (ref 11.5–15.5)
WBC Count: 2.2 10*3/uL — ABNORMAL LOW (ref 4.0–10.5)
nRBC: 0 % (ref 0.0–0.2)

## 2022-06-25 LAB — CMP (CANCER CENTER ONLY)
ALT: 14 U/L (ref 0–44)
AST: 24 U/L (ref 15–41)
Albumin: 3.9 g/dL (ref 3.5–5.0)
Alkaline Phosphatase: 52 U/L (ref 38–126)
Anion gap: 8 (ref 5–15)
BUN: 18 mg/dL (ref 8–23)
CO2: 26 mmol/L (ref 22–32)
Calcium: 9.7 mg/dL (ref 8.9–10.3)
Chloride: 105 mmol/L (ref 98–111)
Creatinine: 0.76 mg/dL (ref 0.44–1.00)
GFR, Estimated: >60 mL/min (ref >60)
Glucose, Bld: 136 mg/dL — ABNORMAL HIGH (ref 70–99)
Potassium: 4 mmol/L (ref 3.5–5.1)
Sodium: 139 mmol/L (ref 135–145)
Total Bilirubin: 0.4 mg/dL (ref 0.3–1.2)
Total Protein: 9.3 g/dL — ABNORMAL HIGH (ref 6.5–8.1)

## 2022-06-25 LAB — FERRITIN: Ferritin: 756 ng/mL — ABNORMAL HIGH (ref 11–307)

## 2022-06-25 LAB — IRON AND IRON BINDING CAPACITY (CC-WL,HP ONLY)
Iron: 74 ug/dL (ref 28–170)
Saturation Ratios: 23 % (ref 10.4–31.8)
TIBC: 329 ug/dL (ref 250–450)
UIBC: 255 ug/dL (ref 148–442)

## 2022-06-25 LAB — MAGNESIUM: Magnesium: 1.8 mg/dL (ref 1.7–2.4)

## 2022-06-25 NOTE — Telephone Encounter (Signed)
Spoke to pt and she will call central scheduling to schedule CTE and she said she received her instructions for the procedure and she was made aware that she was cleared to have the procedure

## 2022-06-25 NOTE — Progress Notes (Signed)
Patient called stating she felt like her magnesium was low and requested to come in for lab work today. Orders placed and scheduling message sent.

## 2022-06-26 ENCOUNTER — Other Ambulatory Visit: Payer: Self-pay

## 2022-06-26 ENCOUNTER — Inpatient Hospital Stay: Payer: Medicare Other

## 2022-06-26 ENCOUNTER — Inpatient Hospital Stay (HOSPITAL_BASED_OUTPATIENT_CLINIC_OR_DEPARTMENT_OTHER): Payer: Medicare Other | Admitting: Hematology & Oncology

## 2022-06-26 ENCOUNTER — Encounter: Payer: Self-pay | Admitting: Hematology & Oncology

## 2022-06-26 VITALS — BP 124/67 | HR 81 | Temp 97.6°F | Resp 19 | Wt 196.0 lb

## 2022-06-26 DIAGNOSIS — C9000 Multiple myeloma not having achieved remission: Secondary | ICD-10-CM

## 2022-06-26 DIAGNOSIS — K529 Noninfective gastroenteritis and colitis, unspecified: Secondary | ICD-10-CM | POA: Diagnosis not present

## 2022-06-26 DIAGNOSIS — R197 Diarrhea, unspecified: Secondary | ICD-10-CM | POA: Diagnosis not present

## 2022-06-26 MED ORDER — DIPHENOXYLATE-ATROPINE 2.5-0.025 MG PO TABS
1.0000 | ORAL_TABLET | Freq: Four times a day (QID) | ORAL | 0 refills | Status: DC | PRN
Start: 2022-06-26 — End: 2023-01-06

## 2022-06-26 MED ORDER — SODIUM CHLORIDE 0.9 % IV SOLN: INTRAVENOUS | Status: AC

## 2022-06-26 MED ORDER — SODIUM CHLORIDE 0.9 % IV SOLN
40.0000 mg | Freq: Once | INTRAVENOUS | Status: AC
  Administered 2022-06-26: 40 mg via INTRAVENOUS
  Filled 2022-06-26: qty 4

## 2022-06-26 NOTE — Progress Notes (Signed)
Hematology and Oncology Follow Up Visit  Tami [name] [id number] 1956-02-24 66 y.o. 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  She called saying that she was having a lot of diarrhea.  She has this.  This is chronic.  She does get better and then the diarrhea worsens.  She has been followed by Gastroenterology.  I am not sure why they cannot see her until October.  I will have to let her Gastroenterologist know.  I am sure that he can get her in a lot sooner.  She has says she has about 5-6 episodes a day of diarrhea.  I really do not think that the diarrhea is anyway related to her having a myeloma.  She has had past endoscopies that have had biopsies which were negative for any obvious amyloid in the colon.  However, I have been following along her myeloma studies.  They are gradually getting higher.  We last checked her myeloma studies her M spike was 2.1 g/dL.  The IgG level was 3100 mg/dL.  The Kappa light chain was 8.1 mg/dL.  She says that Lomotil does seem to help a little bit.  She has had no cough or shortness of breath.  There has been no bleeding.  She has had no nausea or vomiting.  Patient does have the pacemaker in.  There is been no leg swelling.  Currently, I would say performance status is probably ECOG 1.        Medications:  Allergies as of 06/26/2022       Reactions   Benazepril Anaphylaxis, Swelling, Hives   angioedema Throat and lip swelling   Ondansetron Hcl Hives   Redness and hives post IV admin on 07/05/17   Codeine Nausea And  Vomiting   Morphine Hives   Redness and hives noted post IV admin on 07/05/17        Medication List        Accurate as of June 26, 2022  1:33 PM. If you have any questions, ask your nurse or doctor.          acetaminophen 500 MG tablet Commonly known as: TYLENOL Take 500 mg by mouth in the morning.   albuterol 108 (90 Base) MCG/ACT inhaler Commonly known as: VENTOLIN HFA Inhale 2 puffs into the lungs every 6 (six) hours as needed for wheezing or shortness of breath.   amoxicillin 500 MG tablet Commonly known as: AMOXIL Take 1 tablet (500 mg total) by mouth 2 (two) times daily.   aspirin EC 81 MG tablet Take 1 tablet (81 mg total) by mouth daily.   carvedilol 25 MG tablet Commonly known as: COREG Take 1 tablet (25 mg total) by mouth 2 (two) times daily with a meal.   cetirizine 10 MG tablet Commonly known as: ZYRTEC Take 10 mg by mouth daily as needed for allergies.   colestipol 1 g tablet Commonly known as: COLESTID Take 2 tablets (2 g total) by mouth every 12 (twelve) hours.   fluticasone 50 MCG/ACT nasal spray  Commonly known as: FLONASE Place 2 sprays into both nostrils daily. What changed: when to take this   furosemide 20 MG tablet Commonly known as: LASIX Take 1 tablet by mouth daily as needed for fluid.   hyoscyamine 0.125 MG SL tablet Commonly known as: LEVSIN SL Place 1 tablet (0.125 mg total) under the tongue every 4 (four) hours as needed.   ibuprofen 800 MG tablet Commonly known as: ADVIL Take 1 tablet (800 mg total) by mouth 3 (three) times daily. What changed:  when to take this reasons to take this   loratadine 10 MG tablet Commonly known as: CLARITIN Take 1 tablet (10 mg total) by mouth daily. What changed:  when to take this reasons to take this   losartan 50 MG tablet Commonly known as: COZAAR Take 50 mg by mouth daily.   magnesium oxide 400 MG tablet Commonly known as: MAG-OX Take 1 tablet (400 mg total) by mouth 2  (two) times daily. Hold if you have diarrhea   MULTIVITAMIN ADULT PO Take 1 tablet by mouth daily.   oxyCODONE-acetaminophen 5-325 MG tablet Commonly known as: PERCOCET/ROXICET Take 1 tablet by mouth every 6 (six) hours as needed for severe pain.   pantoprazole 40 MG tablet Commonly known as: PROTONIX Take 1 tablet (40 mg total) by mouth 2 (two) times daily.   potassium chloride SA 20 MEQ tablet Commonly known as: KLOR-CON M TAKE 1 TABLET BY MOUTH EVERY MONDAY, WEDNESDAY, AND FRIDAY. FOLLOW UP WITH MDTO MONITOR POTASSIUM LEVEL   PROBIOTIC PO Take 1 capsule by mouth daily.   spironolactone 25 MG tablet Commonly known as: ALDACTONE Take 12.5 mg by mouth daily.   sucralfate 1 g tablet Commonly known as: Carafate Take 1 tablet (1 g total) by mouth 4 (four) times daily -  with meals and at bedtime.   Vitamin D-3 25 MCG (1000 UT) Caps Take 1 capsule (1,000 Units total) by mouth daily.        Allergies:  Allergies  Allergen Reactions   Benazepril Anaphylaxis, Swelling and Hives    angioedema Throat and lip swelling   Ondansetron Hcl Hives    Redness and hives post IV admin on 07/05/17   Codeine Nausea And Vomiting   Morphine Hives    Redness and hives noted post IV admin on 07/05/17    Past Medical History, Surgical history, Social history, and Family History were reviewed and updated.  Review of Systems: Review of Systems  Constitutional: Negative.   HENT: Negative.    Eyes: Negative.   Respiratory: Negative.    Cardiovascular: Negative.   Gastrointestinal: Negative.   Genitourinary: Negative.   Musculoskeletal: Negative.   Skin: Negative.   Neurological: Negative.   Endo/Heme/Allergies: Negative.   Psychiatric/Behavioral: Negative.        Physical Exam:  weight is 196 lb (88.9 kg). Her oral temperature is 97.6 F (36.4 C). Her blood pressure is 124/67 and her pulse is 81. Her respiration is 19 and oxygen saturation is 99%.   Wt Readings from Last 3  Encounters:  06/26/22 196 lb (88.9 kg)  05/28/22 195 lb 1.9 oz (88.5 kg)  05/28/22 196 lb 6 oz (89.1 kg)    Physical Exam Vitals reviewed.  HENT:     Head: Normocephalic and atraumatic.  Eyes:     Pupils: Pupils are equal, round, and reactive to light.  Cardiovascular:     Rate and Rhythm: Normal rate and regular rhythm.     Heart sounds: Normal heart sounds.  Pulmonary:     Effort: Pulmonary effort is normal.     Breath sounds: Normal breath sounds.  Abdominal:     General: Bowel sounds are normal.     Palpations: Abdomen is soft.  Musculoskeletal:        General: No tenderness or deformity. Normal range of motion.     Cervical back: Normal range of motion.  Lymphadenopathy:     Cervical: No cervical adenopathy.  Skin:    General: Skin is warm and dry.     Findings: No erythema or rash.  Neurological:     Mental Status: She is alert and oriented to person, place, and time.  Psychiatric:        Behavior: Behavior normal.        Thought Content: Thought content normal.        Judgment: Judgment normal.      Lab Results  Component Value Date   WBC 2.2 (L) 06/25/2022   HGB 12.3 06/25/2022   HCT 38.6 06/25/2022   MCV 89.1 06/25/2022   PLT 108 (L) 06/25/2022   Lab Results  Component Value Date   FERRITIN 756 (H) 06/25/2022   IRON 74 06/25/2022   TIBC 329 06/25/2022   UIBC 255 06/25/2022   IRONPCTSAT 23 06/25/2022   Lab Results  Component Value Date   RETICCTPCT 0.8 07/16/2016   RBC 4.33 06/25/2022   RETICCTABS 32,640 07/16/2016   Lab Results  Component Value Date   KPAFRELGTCHN 80.7 (H) 05/28/2022   LAMBDASER 4.7 (L) 05/28/2022   KAPLAMBRATIO 17.17 (H) 05/28/2022   Lab Results  Component Value Date   IGGSERUM 3,238 (H) 05/28/2022   IGGSERUM 3,000 (H) 05/28/2022   IGA 148 05/28/2022   IGA 158 05/28/2022   IGMSERUM 116 05/28/2022   IGMSERUM 134 05/28/2022   Lab Results  Component Value Date   TOTALPROTELP 8.4 05/28/2022   ALBUMINELP 3.4  05/28/2022   A1GS 0.3 05/28/2022   A2GS 1.0 05/28/2022   BETS 1.1 05/28/2022   BETA2SER 0.5 07/16/2016   GAMS 2.6 (H) 05/28/2022   MSPIKE 2.1 (H) 05/28/2022   SPEI Comment 05/01/2022     Chemistry      Component Value Date/Time   NA 139 06/25/2022 1047   NA 143 11/08/2017 1127   NA 140 12/31/2016 1000   K 4.0 06/25/2022 1047   K 4.0 11/08/2017 1127   K 3.6 12/31/2016 1000   CL 105 06/25/2022 1047   CL 107 11/08/2017 1127   CO2 26 06/25/2022 1047   CO2 25 11/08/2017 1127   CO2 24 12/31/2016 1000   BUN 18 06/25/2022 1047   BUN 19 11/08/2017 1127   BUN 22.3 12/31/2016 1000   CREATININE 0.76 06/25/2022 1047   CREATININE 0.9 11/08/2017 1127   CREATININE 0.8 12/31/2016 1000      Component Value Date/Time   CALCIUM 9.7 06/25/2022 1047   CALCIUM 9.9 11/08/2017 1127   CALCIUM 9.5 12/31/2016 1000   ALKPHOS 52 06/25/2022 1047   ALKPHOS 44 11/08/2017 1127   ALKPHOS 51 12/31/2016 1000   AST 24 06/25/2022 1047   AST 28 12/31/2016 1000   ALT 14 06/25/2022 1047   ALT 36 11/08/2017 1127   ALT 16 12/31/2016 1000   BILITOT 0.4 06/25/2022 1047   BILITOT 0.46 12/31/2016 1000       Impression and Plan: Ms. Strieter is a very pleasant 66 yo African American female with a previous IgG kappa MGUS with progression to myeloma.   We will  go ahead and give her some IV fluid in the office today.  I think this may help her feel little bit better.  I will send a message to her Gastroenterologist and see if he might be able to get her in to try to get this diarrhea under better control.  We will still follow along the myeloma.  I still am not too inclined to put her on treatment.  She has had a tough time with all forms of treatment that we have used.  I think she has a regular appointment for [place] but I will try to get her back to see [place] in a month.  Hopefully, she will be able to go to [place] with her family next week.  I know she usually goes once a year to [place] about this time.  I would  hate to see her missed this because of her diarrhea.    [name], MD 8/18/20231:33 PM

## 2022-06-26 NOTE — Patient Instructions (Signed)
Dehydration, Adult Dehydration is a condition in which there is not enough water or other fluids in the body. This happens when a person loses more fluids than he or she takes in. Important organs, such as the kidneys, brain, and heart, cannot function without a proper amount of fluids. Any loss of fluids from the body can lead to dehydration. Dehydration can be mild, moderate, or severe. It should be treated right away to prevent it from becoming severe. What are the causes? Dehydration may be caused by: Conditions that cause loss of water or other fluids, such as diarrhea, vomiting, or sweating or urinating a lot. Not drinking enough fluids, especially when you are ill or doing activities that require a lot of energy. Other illnesses and conditions, such as fever or infection. Certain medicines, such as medicines that remove excess fluid from the body (diuretics). Lack of safe drinking water. Not being able to get enough water and food. What increases the risk? The following factors may make you more likely to develop this condition: Having a long-term (chronic) illness that has not been treated properly, such as diabetes, heart disease, or kidney disease. Being 65 years of age or older. Having a disability. Living in a place that is high in altitude, where thinner, drier air causes more fluid loss. Doing exercises that put stress on your body for a long time (endurance sports). What are the signs or symptoms? Symptoms of dehydration depend on how severe it is. Mild or moderate dehydration Thirst. Dry lips or dry mouth. Dizziness or light-headedness, especially when standing up from a seated position. Muscle cramps. Dark urine. Urine may be the color of tea. Less urine or tears produced than usual. Headache. Severe dehydration Changes in skin. Your skin may be cold and clammy, blotchy, or pale. Your skin also may not return to normal after being lightly pinched and released. Little or  no tears, urine, or sweat. Changes in vital signs, such as rapid breathing and low blood pressure. Your pulse may be weak or may be faster than 100 beats a minute when you are sitting still. Other changes, such as: Feeling very thirsty. Sunken eyes. Cold hands and feet. Confusion. Being very tired (lethargic) or having trouble waking from sleep. Short-term weight loss. Loss of consciousness. How is this diagnosed? This condition is diagnosed based on your symptoms and a physical exam. You may have blood and urine tests to help confirm the diagnosis. How is this treated? Treatment for this condition depends on how severe it is. Treatment should be started right away. Do not wait until dehydration becomes severe. Severe dehydration is an emergency and needs to be treated in a hospital. Mild or moderate dehydration can be treated at home. You may be asked to: Drink more fluids. Drink an oral rehydration solution (ORS). This drink helps restore proper amounts of fluids and salts and minerals in the blood (electrolytes). Severe dehydration can be treated: With IV fluids. By correcting abnormal levels of electrolytes. This is often done by giving electrolytes through a tube that is passed through your nose and into your stomach (nasogastric tube, or NG tube). By treating the underlying cause of dehydration. Follow these instructions at home: Oral rehydration solution If told by your health care provider, drink an ORS: Make an ORS by following instructions on the package. Start by drinking small amounts, about  cup (120 mL) every 5-10 minutes. Slowly increase how much you drink until you have taken the amount recommended by your health   care provider. Eating and drinking        Drink enough clear fluid to keep your urine pale yellow. If you were told to drink an ORS, finish the ORS first and then start slowly drinking other clear fluids. Drink fluids such as: Water. Do not drink only  water. Doing that can lead to hyponatremia, which is having too little salt (sodium) in the body. Water from ice chips you suck on. Fruit juice that you have added water to (diluted fruit juice). Low-calorie sports drinks. Eat foods that contain a healthy balance of electrolytes, such as bananas, oranges, potatoes, tomatoes, and spinach. Do not drink alcohol. Avoid the following: Drinks that contain a lot of sugar. These include high-calorie sports drinks, fruit juice that is not diluted, and soda. Caffeine. Foods that are greasy or contain a lot of fat or sugar. General instructions Take over-the-counter and prescription medicines only as told by your health care provider. Do not take sodium tablets. Doing that can lead to having too much sodium in the body (hypernatremia). Return to your normal activities as told by your health care provider. Ask your health care provider what activities are safe for you. Keep all follow-up visits as told by your health care provider. This is important. Contact a health care provider if: You have muscle cramps, pain, or discomfort, such as: Pain in your abdomen and the pain gets worse or stays in one area (localizes). Stiff neck. You have a rash. You are more irritable than usual. You are sleepier or have a harder time waking than usual. You feel weak or dizzy. You feel very thirsty. Get help right away if you have: Any symptoms of severe dehydration. Symptoms of vomiting, such as: You cannot eat or drink without vomiting. Vomiting gets worse or does not go away. Vomit includes blood or green matter (bile). Symptoms that get worse with treatment. A fever. A severe headache. Problems with urination or bowel movements, such as: Diarrhea that gets worse or does not go away. Blood in your stool (feces). This may cause stool to look black and tarry. Not urinating, or urinating only a small amount of very dark urine, within 6-8 hours. Trouble  breathing. These symptoms may represent a serious problem that is an emergency. Do not wait to see if the symptoms will go away. Get medical help right away. Call your local emergency services (911 in the [place] Do not drive yourself to the hospital. Summary Dehydration is a condition in which there is not enough water or other fluids in the body. This happens when a person loses more fluids than he or she takes in. Treatment for this condition depends on how severe it is. Treatment should be started right away. Do not wait until dehydration becomes severe. Drink enough clear fluid to keep your urine pale yellow. If you were told to drink an oral rehydration solution (ORS), finish the ORS first and then start slowly drinking other clear fluids. Take over-the-counter and prescription medicines only as told by your health care provider. Get help right away if you have any symptoms of severe dehydration. This information is not intended to replace advice given to you by your health care provider. Make sure you discuss any questions you have with your health care provider. Document Revised: 03/04/2022 Document Reviewed: 06/08/2019 Elsevier Patient Education  2023 [employer] Inc[employer]  

## 2022-06-28 ENCOUNTER — Encounter (HOSPITAL_BASED_OUTPATIENT_CLINIC_OR_DEPARTMENT_OTHER): Payer: Self-pay | Admitting: Emergency Medicine

## 2022-06-28 ENCOUNTER — Other Ambulatory Visit: Payer: Self-pay

## 2022-06-28 ENCOUNTER — Emergency Department (HOSPITAL_BASED_OUTPATIENT_CLINIC_OR_DEPARTMENT_OTHER)
Admission: EM | Admit: 2022-06-28 | Discharge: 2022-06-28 | Disposition: A | Discharge status: Home / Self Care | Payer: Medicare Other | Attending: Emergency Medicine | Admitting: Emergency Medicine

## 2022-06-28 ENCOUNTER — Emergency Department (HOSPITAL_BASED_OUTPATIENT_CLINIC_OR_DEPARTMENT_OTHER): Payer: Medicare Other

## 2022-06-28 DIAGNOSIS — R519 Headache, unspecified: Secondary | ICD-10-CM | POA: Diagnosis not present

## 2022-06-28 DIAGNOSIS — Z20822 Contact with and (suspected) exposure to covid-19: Secondary | ICD-10-CM | POA: Insufficient documentation

## 2022-06-28 DIAGNOSIS — Z79899 Other long term (current) drug therapy: Secondary | ICD-10-CM | POA: Diagnosis not present

## 2022-06-28 DIAGNOSIS — I509 Heart failure, unspecified: Secondary | ICD-10-CM | POA: Diagnosis not present

## 2022-06-28 DIAGNOSIS — I10 Essential (primary) hypertension: Secondary | ICD-10-CM | POA: Diagnosis not present

## 2022-06-28 DIAGNOSIS — I110 Hypertensive heart disease with heart failure: Secondary | ICD-10-CM | POA: Insufficient documentation

## 2022-06-28 DIAGNOSIS — N390 Urinary tract infection, site not specified: Secondary | ICD-10-CM | POA: Diagnosis not present

## 2022-06-28 DIAGNOSIS — Z7982 Long term (current) use of aspirin: Secondary | ICD-10-CM | POA: Diagnosis not present

## 2022-06-28 DIAGNOSIS — R42 Dizziness and giddiness: Secondary | ICD-10-CM | POA: Diagnosis not present

## 2022-06-28 DIAGNOSIS — R531 Weakness: Secondary | ICD-10-CM | POA: Insufficient documentation

## 2022-06-28 LAB — CBC
HCT: 37.5 % (ref 36.0–46.0)
Hemoglobin: 12.1 g/dL (ref 12.0–15.0)
MCH: 28.7 pg (ref 26.0–34.0)
MCHC: 32.3 g/dL (ref 30.0–36.0)
MCV: 89.1 fL (ref 80.0–100.0)
Platelets: 106 10*3/uL — ABNORMAL LOW (ref 150–400)
RBC: 4.21 MIL/uL (ref 3.87–5.11)
RDW: 14.2 % (ref 11.5–15.5)
WBC: 2.1 10*3/uL — ABNORMAL LOW (ref 4.0–10.5)
nRBC: 0 % (ref 0.0–0.2)

## 2022-06-28 LAB — COMPREHENSIVE METABOLIC PANEL
ALT: 18 U/L (ref 0–44)
AST: 31 U/L (ref 15–41)
Albumin: 3.4 g/dL — ABNORMAL LOW (ref 3.5–5.0)
Alkaline Phosphatase: 48 U/L (ref 38–126)
Anion gap: 4 — ABNORMAL LOW (ref 5–15)
BUN: 16 mg/dL (ref 8–23)
CO2: 27 mmol/L (ref 22–32)
Calcium: 8.8 mg/dL — ABNORMAL LOW (ref 8.9–10.3)
Chloride: 109 mmol/L (ref 98–111)
Creatinine, Ser: 0.65 mg/dL (ref 0.44–1.00)
GFR, Estimated: >60 mL/min (ref >60)
Glucose, Bld: 80 mg/dL (ref 70–99)
Potassium: 3.5 mmol/L (ref 3.5–5.1)
Sodium: 140 mmol/L (ref 135–145)
Total Bilirubin: 0.5 mg/dL (ref 0.3–1.2)
Total Protein: 9.1 g/dL — ABNORMAL HIGH (ref 6.5–8.1)

## 2022-06-28 LAB — SARS CORONAVIRUS 2 BY RT PCR: SARS Coronavirus 2 by RT PCR: NEGATIVE

## 2022-06-28 LAB — URINALYSIS, ROUTINE W REFLEX MICROSCOPIC
Bilirubin Urine: NEGATIVE
Glucose, UA: NEGATIVE mg/dL
Ketones, ur: NEGATIVE mg/dL
Nitrite: NEGATIVE
Protein, ur: NEGATIVE mg/dL
Specific Gravity, Urine: 1.025 (ref 1.005–1.030)
pH: 6 (ref 5.0–8.0)

## 2022-06-28 LAB — LIPASE, BLOOD: Lipase: 38 U/L (ref 11–51)

## 2022-06-28 LAB — URINALYSIS, MICROSCOPIC (REFLEX)

## 2022-06-28 MED ORDER — CEPHALEXIN 500 MG PO CAPS: 500.0000 mg | ORAL_CAPSULE | Freq: Two times a day (BID) | ORAL | 0 refills | Status: AC

## 2022-06-28 MED ORDER — SODIUM CHLORIDE 0.9 % IV BOLUS
1000.0000 mL | Freq: Once | INTRAVENOUS | Status: AC
  Administered 2022-06-28: 1000 mL via INTRAVENOUS

## 2022-06-28 MED ORDER — CEPHALEXIN 250 MG PO CAPS
500.0000 mg | ORAL_CAPSULE | Freq: Once | ORAL | Status: AC
  Administered 2022-06-28: 500 mg via ORAL
  Filled 2022-06-28: qty 2

## 2022-06-28 NOTE — ED Provider Notes (Signed)
Tami [name]   CSN: [id number] Arrival date & time: 06/28/22  1136     History  Chief Complaint  Patient presents with   Weakness    Tami [name] is a 66 y.o. female.  Patient here with generalized weakness.  She feels like she is dehydrated.  She recently had a lot of diarrhea.  She denies any chest pain or shortness of breath.  She has been dealing with some chronic upper abdominal pain and is supposed to see GI soon for that.  She is not having any active abdominal pain now.  Mild headache but that is mostly resolved.  Denies any unilateral weakness or numbness or stroke symptoms.  She feels like she needs IV fluids.  She states that maybe she has a urinary tract infection.  Nothing makes it worse or better.  The history is provided by the patient.       Home Medications Prior to Admission medications   Medication Sig Start Date End Date Taking? Authorizing Provider  cephALEXin (KEFLEX) 500 MG capsule Take 1 capsule (500 mg total) by mouth 2 (two) times daily for 5 days. 06/28/22 07/03/22 Yes Larose Batres, DO  acetaminophen (TYLENOL) 500 MG tablet Take 500 mg by mouth in the morning.    [provider]  albuterol (VENTOLIN HFA) 108 (90 Base) MCG/ACT inhaler Inhale 2 puffs into the lungs every 6 (six) hours as needed for wheezing or shortness of breath. 11/20/21   Mosie Lukes, MD  amoxicillin (AMOXIL) 500 MG tablet Take 1 tablet (500 mg total) by mouth 2 (two) times daily. 06/08/22   [name], MD  aspirin EC 81 MG tablet Take 1 tablet (81 mg total) by mouth daily. 02/15/18   Mosie Lukes, MD  carvedilol (COREG) 25 MG tablet Take 1 tablet (25 mg total) by mouth 2 (two) times daily with a meal. 06/04/20   Mosie Lukes, MD  cetirizine (ZYRTEC) 10 MG tablet Take 10 mg by mouth daily as needed for allergies.    [provider]  Cholecalciferol (VITAMIN D-3) 25 MCG (1000 UT) CAPS Take 1 capsule (1,000 Units  total) by mouth daily. 05/03/20   Mosie Lukes, MD  colestipol (COLESTID) 1 g tablet Take 2 tablets (2 g total) by mouth every 12 (twelve) hours. 04/30/22   Florencia Reasons, MD  diphenoxylate-atropine (LOMOTIL) 2.5-0.025 MG tablet Take 1 tablet by mouth 4 (four) times daily as needed for diarrhea or loose stools. 06/26/22   [name], MD  fluticasone (FLONASE) 50 MCG/ACT nasal spray Place 2 sprays into both nostrils daily. Patient taking differently: Place 2 sprays into both nostrils every evening. 01/07/20   Joy, Shawn C, PA-C  furosemide (LASIX) 20 MG tablet Take 1 tablet by mouth daily as needed for fluid. 07/03/20   [provider]  hyoscyamine (LEVSIN SL) 0.125 MG SL tablet Place 1 tablet (0.125 mg total) under the tongue every 4 (four) hours as needed. 05/21/22   Mosie Lukes, MD  ibuprofen (ADVIL) 800 MG tablet Take 1 tablet (800 mg total) by mouth 3 (three) times daily. Patient taking differently: Take 800 mg by mouth every 8 (eight) hours as needed for moderate pain. 08/25/21   Suzy Bouchard, PA-C  loratadine (CLARITIN) 10 MG tablet Take 1 tablet (10 mg total) by mouth daily. Patient taking differently: Take 10 mg by mouth daily as needed for allergies. 09/08/21   Shelda Pal, DO  losartan (  COZAAR) 50 MG tablet Take 50 mg by mouth daily. 04/14/21   [provider]  magnesium oxide (MAG-OX) 400 MG tablet Take 1 tablet (400 mg total) by mouth 2 (two) times daily. Hold if you have diarrhea 04/30/22   Florencia Reasons, MD  Multiple Vitamins-Minerals (MULTIVITAMIN ADULT PO) Take 1 tablet by mouth daily.    [provider]  oxyCODONE-acetaminophen (PERCOCET/ROXICET) 5-325 MG tablet Take 1 tablet by mouth every 6 (six) hours as needed for severe pain.    [provider]  pantoprazole (PROTONIX) 40 MG tablet Take 1 tablet (40 mg total) by mouth 2 (two) times daily. 05/28/22   Jackquline Denmark, MD  potassium chloride SA (KLOR-CON M) 20 MEQ tablet TAKE 1 TABLET  BY MOUTH EVERY MONDAY, [place], AND FRIDAY. FOLLOW UP WITH MDTO MONITOR POTASSIUM LEVEL 05/27/22   Mosie Lukes, MD  Probiotic Product (PROBIOTIC PO) Take 1 capsule by mouth daily.    [provider]  spironolactone (ALDACTONE) 25 MG tablet Take 12.5 mg by mouth daily.    [provider]  sucralfate (CARAFATE) 1 g tablet Take 1 tablet (1 g total) by mouth 4 (four) times daily -  with meals and at bedtime. 05/21/22   Mosie Lukes, MD  prochlorperazine (COMPAZINE) 10 MG tablet Take 1 tablet (10 mg total) by mouth every 6 (six) hours as needed (Nausea or vomiting). 09/04/20 12/19/20  [name], MD      Allergies    Benazepril, Ondansetron hcl, Codeine, and Morphine    Review of Systems   Review of Systems  Physical Exam Updated Vital Signs BP (!) 151/105   Pulse 71   Temp 98.1 F (36.7 C) (Oral)   Resp 16   Ht $R'5\' 5"'tU$  (1.651 m)   Wt 88.5 kg   SpO2 97%   BMI 32.45 kg/m  Physical Exam Vitals and nursing [name] reviewed.  Constitutional:      General: She is not in acute distress.    Appearance: She is well-developed. She is not ill-appearing.  HENT:     Head: Normocephalic and atraumatic.     Mouth/Throat:     Mouth: Mucous membranes are moist.     Pharynx: Oropharynx is clear.  Eyes:     Extraocular Movements: Extraocular movements intact.     Conjunctiva/sclera: Conjunctivae normal.     Pupils: Pupils are equal, round, and reactive to light.  Cardiovascular:     Rate and Rhythm: Normal rate and regular rhythm.     Heart sounds: Normal heart sounds. No murmur heard. Pulmonary:     Effort: Pulmonary effort is normal. No respiratory distress.     Breath sounds: Normal breath sounds.  Abdominal:     Palpations: Abdomen is soft.     Tenderness: There is no abdominal tenderness.  Musculoskeletal:        General: No swelling.     Cervical back: Neck supple.  Skin:    General: Skin is warm and dry.     Capillary Refill: Capillary refill takes less  than 2 seconds.  Neurological:     General: No focal deficit present.     Mental Status: She is alert and oriented to person, place, and time.     Cranial Nerves: No cranial nerve deficit.     Motor: No weakness.     Comments: 5+ out of 5 strength throughout, normal sensation, no drift, normal finger-nose-finger, normal speech  Psychiatric:        Mood and  Affect: Mood normal.     ED Results / Procedures / Treatments   Labs (all labs ordered are listed, but only abnormal results are displayed) Labs Reviewed  COMPREHENSIVE METABOLIC PANEL - Abnormal; Notable for the following components:      Result Value   Calcium 8.8 (*)    Total Protein 9.1 (*)    Albumin 3.4 (*)    Anion gap 4 (*)    All other components within normal limits  CBC - Abnormal; Notable for the following components:   WBC 2.1 (*)    Platelets 106 (*)    All other components within normal limits  URINALYSIS, ROUTINE W REFLEX MICROSCOPIC - Abnormal; Notable for the following components:   APPearance CLOUDY (*)    Hgb urine dipstick TRACE (*)    Leukocytes,Ua SMALL (*)    All other components within normal limits  URINALYSIS, MICROSCOPIC (REFLEX) - Abnormal; Notable for the following components:   Bacteria, UA MANY (*)    All other components within normal limits  SARS CORONAVIRUS 2 BY RT PCR  URINE CULTURE  LIPASE, BLOOD    EKG EKG Interpretation  Date/Time:  Sunday June 28 2022 12:14:33 EDT Ventricular Rate:  82 PR Interval:  174 QRS Duration: 110 QT Interval:  434 QTC Calculation: 507 R Axis:   -23 Text Interpretation: Atrial-sensed ventricular-paced rhythm Abnormal ECG When compared with ECG of 27-Apr-2022 20:44, PREVIOUS ECG IS PRESENT Confirmed by Lennice Sites ([id number]) on 06/28/2022 3:01:15 PM  Radiology CT Head Wo Contrast  Result Date: 06/28/2022 CLINICAL DATA:  Headache.  Weakness and dizziness. EXAM: CT HEAD WITHOUT CONTRAST TECHNIQUE: Contiguous axial images were obtained from the base of  the skull through the vertex without intravenous contrast. RADIATION DOSE REDUCTION: This exam was performed according to the departmental dose-optimization program which includes automated exposure control, adjustment of the mA and/or kV according to patient size and/or use of iterative reconstruction technique. COMPARISON:  12/10/2021 FINDINGS: Brain: No evidence of acute infarction, hemorrhage, hydrocephalus, extra-axial collection or mass lesion/mass effect. Vascular: No hyperdense vessel or unexpected calcification. Skull: Normal. Negative for fracture or focal lesion. Sinuses/Orbits: No acute finding. Other: None. IMPRESSION: 1. No acute intracranial abnormalities.  Normal brain. Electronically Signed   By: Kerby Moors M.D.   On: 06/28/2022 16:58    Procedures Procedures    Medications Ordered in ED Medications  sodium chloride 0.9 % bolus 1,000 mL (1,000 mLs Intravenous New Bag/Given 06/28/22 1516)  cephALEXin (KEFLEX) capsule 500 mg (500 mg Oral Given 06/28/22 1653)    ED Course/ Medical Decision Making/ A&P                           Medical Decision Making Amount and/or Complexity of Data Reviewed Labs: ordered. Radiology: ordered.  Risk Prescription drug management.   Tami [name] is here with concern for dehydration and weakness.  History of CHF, multiple myeloma, stroke, hypertension, high cholesterol.  Patient with overall unremarkable vitals.  No fever.  Well-appearing.  Normal neuro exam.  No abdominal tenderness.  She has been having some upper abdominal pain at times.  She supposed to see GI for endoscopy.  Not having any active abdominal pain or chest pain or shortness of breath.  Well-appearing.  She thinks she needs IV fluids which is reasonable.  We will check her labs to evaluate for differential diagnosis of dehydration, electrolyte abnormality, anemia.  Have no concern for intra-abdominal process at this time including  cholecystitis or pancreatitis.  Neurologically  she is intact and have no concern for stroke or other acute neurologic or cardiac process.  EKG shows sinus rhythm per my review and interpretation.  We will get CBC, CMP, lipase, fluid bolus, urinalysis.  Per my review and interpretation of labs is no significant anemia, electrolyte abnormality, kidney injury, leukocytosis.  Urinalysis appears to be infectious especially with her being symptomatic.  Head CT was obtained that was unremarkable.  Overall she is feeling better after IV fluids.  Patient given Keflex.  We will treat for UTI.  Discharged in good condition.  This chart was dictated using voice recognition software.  Despite best efforts to proofread,  errors can occur which can change the documentation meaning.         Final Clinical Impression(s) / ED Diagnoses Final diagnoses:  Weakness  Lower urinary tract infectious disease    Rx / DC Orders ED Discharge Orders          Ordered    cephALEXin (KEFLEX) 500 MG capsule  2 times daily        06/28/22 1707              CuratoloQuita Skye, DO 06/28/22 1708

## 2022-06-28 NOTE — ED Notes (Signed)
Lab work was not successful, had very slow blood flow, was not able to get any blood into the vials.

## 2022-06-28 NOTE — ED Notes (Signed)
Warm blanket provided to pt. No other request at this time.

## 2022-06-28 NOTE — ED Triage Notes (Signed)
Pt arrives pov, to triage in wheelchair, c/o abdominal pain with n/v and HA yesterday. Reports weakness and dizziness today. Speech clear, AOx4. Rec'd fluids Friday for dehydration per pt statement.

## 2022-06-29 ENCOUNTER — Encounter: Payer: Self-pay | Admitting: Family Medicine

## 2022-06-29 ENCOUNTER — Ambulatory Visit (INDEPENDENT_AMBULATORY_CARE_PROVIDER_SITE_OTHER): Payer: Medicare Other | Admitting: Family Medicine

## 2022-06-29 DIAGNOSIS — E785 Hyperlipidemia, unspecified: Secondary | ICD-10-CM

## 2022-06-29 DIAGNOSIS — R197 Diarrhea, unspecified: Secondary | ICD-10-CM

## 2022-06-29 DIAGNOSIS — E876 Hypokalemia: Secondary | ICD-10-CM

## 2022-06-29 DIAGNOSIS — I10 Essential (primary) hypertension: Secondary | ICD-10-CM

## 2022-06-29 DIAGNOSIS — G4733 Obstructive sleep apnea (adult) (pediatric): Secondary | ICD-10-CM

## 2022-06-29 DIAGNOSIS — R7989 Other specified abnormal findings of blood chemistry: Secondary | ICD-10-CM

## 2022-06-29 DIAGNOSIS — R739 Hyperglycemia, unspecified: Secondary | ICD-10-CM | POA: Diagnosis not present

## 2022-06-29 DIAGNOSIS — C9000 Multiple myeloma not having achieved remission: Secondary | ICD-10-CM

## 2022-06-29 DIAGNOSIS — R350 Frequency of micturition: Secondary | ICD-10-CM | POA: Diagnosis not present

## 2022-06-29 NOTE — Assessment & Plan Note (Signed)
Encourage heart healthy diet such as MIND or DASH diet, increase exercise, avoid trans fats, simple carbohydrates and processed foods, consider a krill or fish or flaxseed oil cap daily.  °

## 2022-06-29 NOTE — Assessment & Plan Note (Signed)
Has increased over the last several days, if it continues to worsen, let us know so we can test for cdiff

## 2022-06-29 NOTE — Patient Instructions (Signed)
Magnesium Glycinate 200 mg at bed L Tryptophan capsules for sleep Melatonin 2-4 mg at bedtime   Insomnia Insomnia is a sleep disorder that makes it difficult to fall asleep or stay asleep. Insomnia can cause fatigue, low energy, difficulty concentrating, mood swings, and poor performance at work or school. There are three different ways to classify insomnia: Difficulty falling asleep. Difficulty staying asleep. Waking up too early in the morning. Any type of insomnia can be long-term (chronic) or short-term (acute). Both are common. Short-term insomnia usually lasts for 3 months or less. Chronic insomnia occurs at least three times a week for longer than 3 months. What are the causes? Insomnia may be caused by another condition, situation, or substance, such as: Having certain mental health conditions, such as anxiety and depression. Using caffeine, alcohol, tobacco, or drugs. Having gastrointestinal conditions, such as gastroesophageal reflux disease (GERD). Having certain medical conditions. These include: Asthma. Alzheimer's disease. Stroke. Chronic pain. An overactive thyroid gland (hyperthyroidism). Other sleep disorders, such as restless legs syndrome and sleep apnea. Menopause. Sometimes, the cause of insomnia may not be known. What increases the risk? Risk factors for insomnia include: Gender. Females are affected more often than males. Age. Insomnia is more common as people get older. Stress and certain medical and mental health conditions. Lack of exercise. Having an irregular work schedule. This may include working night shifts and traveling between different time zones. What are the signs or symptoms? If you have insomnia, the main symptom is having trouble falling asleep or having trouble staying asleep. This may lead to other symptoms, such as: Feeling tired or having low energy. Feeling nervous about going to sleep. Not feeling rested in the morning. Having trouble  concentrating. Feeling irritable, anxious, or depressed. How is this diagnosed? This condition may be diagnosed based on: Your symptoms and medical history. Your health care provider may ask about: Your sleep habits. Any medical conditions you have. Your mental health. A physical exam. How is this treated? Treatment for insomnia depends on the cause. Treatment may focus on treating an underlying condition that is causing the insomnia. Treatment may also include: Medicines to help you sleep. Counseling or therapy. Lifestyle adjustments to help you sleep better. Follow these instructions at home: Eating and drinking  Limit or avoid alcohol, caffeinated beverages, and products that contain nicotine and tobacco, especially close to bedtime. These can disrupt your sleep. Do not eat a large meal or eat spicy foods right before bedtime. This can lead to digestive discomfort that can make it hard for you to sleep. Sleep habits  Keep a sleep diary to help you and your health care provider figure out what could be causing your insomnia. Write down: When you sleep. When you wake up during the night. How well you sleep and how rested you feel the next day. Any side effects of medicines you are taking. What you eat and drink. Make your bedroom a dark, comfortable place where it is easy to fall asleep. Put up shades or blackout curtains to block light from outside. Use a white noise machine to block noise. Keep the temperature cool. Limit screen use before bedtime. This includes: Not watching TV. Not using your smartphone, tablet, or computer. Stick to a routine that includes going to bed and waking up at the same times every day and night. This can help you fall asleep faster. Consider making a quiet activity, such as reading, part of your nighttime routine. Try to avoid taking naps during the  day so that you sleep better at night. Get out of bed if you are still awake after 15 minutes of  trying to sleep. Keep the lights down, but try reading or doing a quiet activity. When you feel sleepy, go back to bed. General instructions Take over-the-counter and prescription medicines only as told by your health care provider. Exercise regularly as told by your health care provider. However, avoid exercising in the hours right before bedtime. Use relaxation techniques to manage stress. Ask your health care provider to suggest some techniques that may work well for you. These may include: Breathing exercises. Routines to release muscle tension. Visualizing peaceful scenes. Make sure that you drive carefully. Do not drive if you feel very sleepy. Keep all follow-up visits. This is important. Contact a health care provider if: You are tired throughout the day. You have trouble in your daily routine due to sleepiness. You continue to have sleep problems, or your sleep problems get worse. Get help right away if: You have thoughts about hurting yourself or someone else. Get help right away if you feel like you may hurt yourself or others, or have thoughts about taking your own life. Go to your nearest emergency room or: Call 911. Call the [facility] at [phone] or 988. This is open 24 hours a day. Text the Crisis Text Line at 601-511-5079. Summary Insomnia is a sleep disorder that makes it difficult to fall asleep or stay asleep. Insomnia can be long-term (chronic) or short-term (acute). Treatment for insomnia depends on the cause. Treatment may focus on treating an underlying condition that is causing the insomnia. Keep a sleep diary to help you and your health care provider figure out what could be causing your insomnia. This information is not intended to replace advice given to you by your health care provider. Make sure you discuss any questions you have with your health care provider. Document Revised: 10/06/2021 Document Reviewed: 10/06/2021 Elsevier  Patient Education  [place].

## 2022-06-29 NOTE — Assessment & Plan Note (Signed)
Using CPAP, not sleeping well, insomnia encouraged Magnesium Glycinate, L Tryptophan,

## 2022-06-29 NOTE — Assessment & Plan Note (Signed)
Supplement and monitor 

## 2022-06-29 NOTE — Assessment & Plan Note (Signed)
Has just been diagnosed with  UTI and has taken one dose of Keflex.

## 2022-06-29 NOTE — Assessment & Plan Note (Signed)
Following closely with oncology

## 2022-06-29 NOTE — Progress Notes (Signed)
Subjective:    [name], female    DOB: 08-08-1956, 66 y.o.   MRN: [id number]  Chief Complaint  Patient presents with   Follow-up    HPI Patient is in today for fol\low up on chronic medical concerns. No recent febrile illness or acute hospitalizations. No complaints of polydipsia but does not some polyuria. No dysuria or hematuria. Her diarrhea has increased over the past few days. No bloody or tarry stool. Denies CP/palp/SOB/HA/congestion/fevers. Taking meds as prescribed   Past Medical History:  Diagnosis Date   Abnormal SPEP 07/26/2016   Arthritis    knees, hands   Back pain 07/14/2016   Bowel obstruction ([place]) 11/2014   C. 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   Multiple myeloma ([place]) 07/20/2018   Multiple myeloma not having achieved remission ([place]) 07/20/2018   Myalgia 12/31/2016   Obstructive sleep apnea 03/31/2015   SOB (shortness of breath) 04/09/2016   Stroke ([place])    TIAs   UTI (urinary tract infection)     Past Surgical History:  Procedure Laterality Date   ABDOMINAL HYSTERECTOMY     menorraghia, 2006, total   CHOLECYSTECTOMY     COLONOSCOPY  2018   [facility] per patient   ESOPHAGOGASTRODUODENOSCOPY  2018   [facility]    KNEE SURGERY     right, repair torn torn cartialage   PACEMAKER INSERTION  08/2014   TONSILLECTOMY     TUBAL LIGATION      Family History  Problem Relation Age of Onset   Diabetes Mother    Hypertension Mother    Heart disease Mother        s/p 1  stent   Hyperlipidemia Mother    Arthritis Mother    Cancer Father        COLON   Colon cancer Father 37   Irritable bowel syndrome Sister    Hyperlipidemia Daughter    Hypertension Daughter    Hypertension Maternal Grandmother    Arthritis Maternal Grandmother    Heart disease Maternal Grandfather        MI   Hypertension Maternal Grandfather    Arthritis Maternal Grandfather    Hypertension Son    Esophageal cancer Neg Hx     Social History   Socioeconomic History   Marital status: Divorced    Spouse name: [name]   Number of children: 3   Years of education: [name]   Highest education level: High school graduate  Occupational History   [name]  Tobacco Use   Smoking status: Never   Smokeless tobacco: Never  Vaping Use   Vaping Use: Never used  Substance and Sexual Activity   Alcohol use: No   Drug use: No   Sexual activity: [name]  Other Topics Concern   [name]  Social History Narrative   Lives at home alone   Retired   Caffeine: coffee   Social Determinants of Health  Financial Resource Strain: Low Risk  (10/17/2021)   Overall Financial Resource Strain (CARDIA)    Difficulty of Paying Living Expenses: Not hard at all  Food Insecurity: No Food Insecurity (10/17/2021)   Hunger Vital Sign    Worried About Running Out of Food in the Last Year: Never true    Ran Out of Food in the Last Year: Never true  Transportation Needs: No Transportation Needs (10/17/2021)   PRAPARE - Hydrologist (Medical): No    Lack of Transportation (Non-Medical): No  Physical Activity: Insufficiently Active (10/17/2021)   Exercise Vital Sign    Days of Exercise per Week: 2 days    Minutes of Exercise per Session: 30 min  Stress: No Stress Concern Present (10/17/2021)   [place]    Feeling of Stress : Not at all  Social Connections: Moderately Isolated (10/17/2021)    Social Connection and Isolation Panel [NHANES]    Frequency of Communication with Friends and Family: More than three times a week    Frequency of Social Gatherings with Friends and Family: More than three times a week    Attends Religious Services: More than 4 times per year    Active Member of [employer] or Organizations: No    Attends Archivist Meetings: Never    Marital Status: Divorced  Human resources officer Violence: Not At Risk (10/17/2021)   Humiliation, Afraid, Rape, and Kick questionnaire    Fear of Current or Ex-Partner: No    Emotionally Abused: No    Physically Abused: No    Sexually Abused: No    Outpatient Medications Prior to Visit  Medication Sig Dispense Refill   acetaminophen (TYLENOL) 500 MG tablet Take 500 mg by mouth in the morning.     albuterol (VENTOLIN HFA) 108 (90 Base) MCG/ACT inhaler Inhale 2 puffs into the lungs every 6 (six) hours as needed for wheezing or shortness of breath. 18 g 5   aspirin EC 81 MG tablet Take 1 tablet (81 mg total) by mouth daily. 90 tablet 1   carvedilol (COREG) 25 MG tablet Take 1 tablet (25 mg total) by mouth 2 (two) times daily with a meal. 180 tablet 1   cephALEXin (KEFLEX) 500 MG capsule Take 1 capsule (500 mg total) by mouth 2 (two) times daily for 5 days. 10 capsule 0   cetirizine (ZYRTEC) 10 MG tablet Take 10 mg by mouth daily as needed for allergies.     Cholecalciferol (VITAMIN D-3) 25 MCG (1000 UT) CAPS Take 1 capsule (1,000 Units total) by mouth daily. 60 capsule 2   colestipol (COLESTID) 1 g tablet Take 2 tablets (2 g total) by mouth every 12 (twelve) hours. 120 tablet 1   diphenoxylate-atropine (LOMOTIL) 2.5-0.025 MG tablet Take 1 tablet by mouth 4 (four) times daily as needed for diarrhea or loose stools. 100 tablet 0   fluticasone (FLONASE) 50 MCG/ACT nasal spray Place 2 sprays into both nostrils daily. (Patient taking differently: Place 2 sprays into both nostrils every evening.) 16 g 0   furosemide (LASIX) 20 MG  tablet Take 1 tablet by mouth daily as needed for fluid.     hyoscyamine (LEVSIN SL) 0.125 MG SL tablet Place 1 tablet (0.125 mg total) under the tongue every 4 (four) hours as needed. 30 tablet 1   ibuprofen (ADVIL) 800 MG tablet Take 1 tablet (800 mg total) by mouth 3 (three) times daily. (Patient taking differently: Take 800  mg by mouth every 8 (eight) hours as needed for moderate pain.) 21 tablet 0   loratadine (CLARITIN) 10 MG tablet Take 1 tablet (10 mg total) by mouth daily. (Patient taking differently: Take 10 mg by mouth daily as needed for allergies.) 30 tablet 11   losartan (COZAAR) 50 MG tablet Take 50 mg by mouth daily.     magnesium oxide (MAG-OX) 400 MG tablet Take 1 tablet (400 mg total) by mouth 2 (two) times daily. Hold if you have diarrhea 60 tablet 1   Multiple Vitamins-Minerals (MULTIVITAMIN ADULT PO) Take 1 tablet by mouth daily.     oxyCODONE-acetaminophen (PERCOCET/ROXICET) 5-325 MG tablet Take 1 tablet by mouth every 6 (six) hours as needed for severe pain.     pantoprazole (PROTONIX) 40 MG tablet Take 1 tablet (40 mg total) by mouth 2 (two) times daily. 180 tablet 4   potassium chloride SA (KLOR-CON M) 20 MEQ tablet TAKE 1 TABLET BY MOUTH EVERY MONDAY, WEDNESDAY, AND FRIDAY. FOLLOW UP WITH MDTO MONITOR POTASSIUM LEVEL 30 tablet 0   Probiotic Product (PROBIOTIC PO) Take 1 capsule by mouth daily.     prochlorperazine (COMPAZINE) 10 MG tablet Take 10 mg by mouth every 6 (six) hours as needed (Nausea or vomiting). 30 tablet 1   spironolactone (ALDACTONE) 25 MG tablet Take 12.5 mg by mouth daily.     sucralfate (CARAFATE) 1 g tablet Take 1 tablet (1 g total) by mouth 4 (four) times daily -  with meals and at bedtime. 120 tablet 2   amoxicillin (AMOXIL) 500 MG tablet Take 1 tablet (500 mg total) by mouth 2 (two) times daily. (Patient not taking: Reported on 06/29/2022) 14 tablet 0   No facility-administered medications prior to visit.    Allergies  Allergen Reactions    Benazepril Anaphylaxis, Swelling and Hives    angioedema Throat and lip swelling   Ondansetron Hcl Hives    Redness and hives post IV admin on 07/05/17   Codeine Nausea And Vomiting   Morphine Hives    Redness and hives noted post IV admin on 07/05/17    Review of Systems  Constitutional:  Negative for fever and malaise/fatigue.  HENT:  Negative for congestion.   Eyes:  Negative for blurred vision.  Respiratory:  Negative for shortness of breath.   Cardiovascular:  Negative for chest pain, palpitations and leg swelling.  Gastrointestinal:  Positive for abdominal pain and diarrhea. Negative for blood in stool, melena and nausea.  Genitourinary:  Positive for frequency. Negative for dysuria.  Musculoskeletal:  Negative for falls.  Skin:  Negative for rash.  Neurological:  Negative for dizziness, loss of consciousness and headaches.  Endo/Heme/Allergies:  Negative for environmental allergies.  Psychiatric/Behavioral:  Negative for depression. The patient is not nervous/anxious.        Objective:    Physical Exam Constitutional:      General: She is not in acute distress.    Appearance: She is well-developed.  HENT:     Head: Normocephalic and atraumatic.  Eyes:     Conjunctiva/sclera: Conjunctivae normal.  Neck:     Thyroid: No thyromegaly.  Cardiovascular:     Rate and Rhythm: Normal rate and regular rhythm.     Heart sounds: Normal heart sounds. No murmur heard. Pulmonary:     Effort: Pulmonary effort is normal. No respiratory distress.     Breath sounds: Normal breath sounds.  Abdominal:     General: Bowel sounds are normal. There is no distension.  Palpations: Abdomen is soft. There is no mass.     Tenderness: There is no abdominal tenderness.  Musculoskeletal:     Cervical back: Neck supple.  Lymphadenopathy:     Cervical: No cervical adenopathy.  Skin:    General: Skin is warm and dry.  Neurological:     Mental Status: She is alert and oriented to person,  place, and time.  Psychiatric:        Behavior: Behavior normal.     BP (!) 140/98   Pulse 90   Temp 98.3 F (36.8 C) (Oral)   Resp 18   Ht $R'5\' 5"'ww$  (1.651 m)   Wt 198 lb 6 oz (90 kg)   SpO2 98%   BMI 33.01 kg/m  Wt Readings from Last 3 Encounters:  06/29/22 198 lb 6 oz (90 kg)  06/28/22 195 lb (88.5 kg)  06/26/22 196 lb (88.9 kg)    Diabetic Foot Exam - Simple   No data filed    Lab Results  Component Value Date   WBC 2.1 (L) 06/28/2022   HGB 12.1 06/28/2022   HCT 37.5 06/28/2022   PLT 106 (L) 06/28/2022   GLUCOSE 80 06/28/2022   CHOL 171 04/21/2022   TRIG 105.0 04/21/2022   HDL 49.40 04/21/2022   LDLCALC 100 (H) 04/21/2022   ALT 18 06/28/2022   AST 31 06/28/2022   NA 140 06/28/2022   K 3.5 06/28/2022   CL 109 06/28/2022   CREATININE 0.65 06/28/2022   BUN 16 06/28/2022   CO2 27 06/28/2022   TSH 1.97 04/21/2022   INR 0.90 07/28/2018   HGBA1C 5.8 04/21/2022    Lab Results  Component Value Date   TSH 1.97 04/21/2022   Lab Results  Component Value Date   WBC 2.1 (L) 06/28/2022   HGB 12.1 06/28/2022   HCT 37.5 06/28/2022   MCV 89.1 06/28/2022   PLT 106 (L) 06/28/2022   Lab Results  Component Value Date   NA 140 06/28/2022   K 3.5 06/28/2022   CHLORIDE 108 12/31/2016   CO2 27 06/28/2022   GLUCOSE 80 06/28/2022   BUN 16 06/28/2022   CREATININE 0.65 06/28/2022   BILITOT 0.5 06/28/2022   ALKPHOS 48 06/28/2022   AST 31 06/28/2022   ALT 18 06/28/2022   PROT 9.1 (H) 06/28/2022   ALBUMIN 3.4 (L) 06/28/2022   CALCIUM 8.8 (L) 06/28/2022   ANIONGAP 4 (L) 06/28/2022   EGFR >90 12/31/2016   GFR 89.77 05/21/2022   Lab Results  Component Value Date   CHOL 171 04/21/2022   Lab Results  Component Value Date   HDL 49.40 04/21/2022   Lab Results  Component Value Date   LDLCALC 100 (H) 04/21/2022   Lab Results  Component Value Date   TRIG 105.0 04/21/2022   Lab Results  Component Value Date   CHOLHDL 3 04/21/2022   Lab Results  Component  Value Date   HGBA1C 5.8 04/21/2022       Assessment & Plan:   Problem List Items Addressed This Visit     Multiple myeloma ([place]) (Chronic)    Following closely with oncology      Relevant Medications   prochlorperazine (COMPAZINE) 10 MG tablet   Hyperlipidemia    Encourage heart healthy diet such as MIND or DASH diet, increase exercise, avoid trans fats, simple carbohydrates and processed foods, consider a krill or fish or flaxseed oil cap daily.       Hypertension    Well controlled,  no changes to meds. Encouraged heart healthy diet such as the DASH diet and exercise as tolerated.       Obstructive sleep apnea    Using CPAP, not sleeping well, insomnia encouraged Magnesium Glycinate, L Tryptophan,       Hypokalemia    Supplement and monitor      Diarrhea    Has increased over the last several days, if it continues to worsen, let us know so we can test for cdiff      Urinary frequency    Has just been diagnosed with  UTI and has taken one dose of Keflex.       Hyperglycemia    hgba1c acceptable, minimize simple carbs. Increase exercise as tolerated.      Hypomagnesemia    Supplement and monitor      Low vitamin D level    Supplement and monitor       I have changed Mardene Celeste A. Hult's prochlorperazine. I am also having her maintain her aspirin EC, Multiple Vitamins-Minerals (MULTIVITAMIN ADULT PO), fluticasone, Vitamin D-3, carvedilol, furosemide, acetaminophen, losartan, ibuprofen, loratadine, albuterol, spironolactone, Probiotic Product (PROBIOTIC PO), cetirizine, oxyCODONE-acetaminophen, magnesium oxide, colestipol, hyoscyamine, sucralfate, potassium chloride SA, pantoprazole, amoxicillin, diphenoxylate-atropine, and cephALEXin.  No orders of the defined types were placed in this encounter.    Penni Homans, MD

## 2022-06-29 NOTE — Assessment & Plan Note (Signed)
Well controlled, no changes to meds. Encouraged heart healthy diet such as the DASH diet and exercise as tolerated.  °

## 2022-06-29 NOTE — Assessment & Plan Note (Signed)
hgba1c acceptable, minimize simple carbs. Increase exercise as tolerated.  

## 2022-06-30 ENCOUNTER — Ambulatory Visit (HOSPITAL_COMMUNITY)
Admission: RE | Admit: 2022-06-30 | Discharge: 2022-06-30 | Disposition: A | Discharge status: Home / Self Care | Payer: Medicare Other | Source: Ambulatory Visit | Attending: Gastroenterology | Admitting: Gastroenterology

## 2022-06-30 DIAGNOSIS — K760 Fatty (change of) liver, not elsewhere classified: Secondary | ICD-10-CM | POA: Insufficient documentation

## 2022-06-30 DIAGNOSIS — R109 Unspecified abdominal pain: Secondary | ICD-10-CM | POA: Diagnosis not present

## 2022-06-30 DIAGNOSIS — R1013 Epigastric pain: Secondary | ICD-10-CM | POA: Diagnosis not present

## 2022-06-30 DIAGNOSIS — R112 Nausea with vomiting, unspecified: Secondary | ICD-10-CM | POA: Diagnosis not present

## 2022-06-30 DIAGNOSIS — K580 Irritable bowel syndrome with diarrhea: Secondary | ICD-10-CM | POA: Diagnosis not present

## 2022-06-30 DIAGNOSIS — R1110 Vomiting, unspecified: Secondary | ICD-10-CM | POA: Diagnosis not present

## 2022-06-30 DIAGNOSIS — K56600 Partial intestinal obstruction, unspecified as to cause: Secondary | ICD-10-CM | POA: Insufficient documentation

## 2022-06-30 MED ORDER — BARIUM SULFATE 0.1 % PO SUSP
ORAL | Status: AC
  Administered 2022-06-30: 1350 mL
  Filled 2022-06-30: qty 3

## 2022-06-30 MED ORDER — IOHEXOL 300 MG/ML  SOLN
100.0000 mL | Freq: Once | INTRAMUSCULAR | Status: AC | PRN
  Administered 2022-06-30: 100 mL via INTRAVENOUS

## 2022-07-01 ENCOUNTER — Telehealth: Payer: Self-pay

## 2022-07-01 ENCOUNTER — Telehealth: Payer: Self-pay | Admitting: Family Medicine

## 2022-07-01 ENCOUNTER — Other Ambulatory Visit: Payer: Self-pay | Admitting: Family Medicine

## 2022-07-01 DIAGNOSIS — R5381 Other malaise: Secondary | ICD-10-CM

## 2022-07-01 LAB — URINE CULTURE: Culture: >=100000 — AB

## 2022-07-01 NOTE — Telephone Encounter (Signed)
Lujean Rave ([place]) called on pts behalf stating that she was requesting a referral for [facility] care. Rep stated pt needs [place] aide to assist with household management. If any benefit info is needed, rep provided phone line for verification:  Benefit info: [phone]

## 2022-07-01 NOTE — Telephone Encounter (Signed)
Post ED Visit - Positive Culture Follow-up: Successful Patient Follow-Up  Culture assessed and recommendations reviewed by:  '[]'$  Elenor Quinones, Pharm.D. '[]'$  Heide Guile, Pharm.D., BCPS AQ-ID '[]'$  Parks Neptune, Pharm.D., BCPS '[]'$  Alycia Rossetti, Pharm.D., BCPS '[]'$  [place], [place].D., BCPS, AAHIVP '[]'$  Legrand Como, Pharm.D., BCPS, AAHIVP '[x]'$  Salome Arnt, PharmD, BCPS '[]'$  Johnnette Gourd, PharmD, BCPS '[]'$  Hughes Better, PharmD, BCPS '[]'$  Leeroy Cha, PharmD  Positive urine culture  '[]'$  Patient discharged without antimicrobial prescription and treatment is now indicated '[x]'$  Organism is resistant to prescribed ED discharge antimicrobial '[]'$  Patient with positive blood cultures  Changes discussed with ED provider: Benedetto Goad, PA-C New antibiotic prescription Macrobid 100 mg po BID x 5 days  Called to [facility] in [place], [place] patient, date 07/01/22, time 3:30 pm   Glennon Hamilton 07/01/2022, 3:32 PM

## 2022-07-01 NOTE — Progress Notes (Signed)
ED Antimicrobial Stewardship Positive Culture Follow Up   Tami [name] is an 66 y.o. female who presented to [facility] on 06/28/2022 with a chief complaint of  Chief Complaint  Patient presents with   Weakness    Recent Results (from the past 720 hour(s))  SARS Coronavirus 2 by RT PCR (hospital order, performed in [facility] hospital lab) *cepheid single result test* Anterior Nasal Swab     Status: None   Collection Time: 06/28/22 12:09 PM   Specimen: Anterior Nasal Swab  Result Value Ref Range Status   SARS Coronavirus 2 by RT PCR NEGATIVE NEGATIVE Final    Comment: (NOTE) SARS-CoV-2 target nucleic acids are NOT DETECTED.  The SARS-CoV-2 RNA is generally detectable in upper and lower respiratory specimens during the acute phase of infection. The lowest concentration of SARS-CoV-2 viral copies this assay can detect is 250 copies / mL. A negative result does not preclude SARS-CoV-2 infection and should not be used as the sole basis for treatment or other patient management decisions.  A negative result may occur with improper specimen collection / handling, submission of specimen other than nasopharyngeal swab, presence of viral mutation(s) within the areas targeted by this assay, and inadequate number of viral copies (<250 copies / mL). A negative result must be combined with clinical observations, patient history, and epidemiological information.  Fact Sheet for Patients:   https://www.patel.info/  Fact Sheet for Healthcare Providers: https://hall.com/  This test is not yet approved or  cleared by the [place] FDA and has been authorized for detection and/or diagnosis of SARS-CoV-2 by FDA under an Emergency Use Authorization (EUA).  This EUA will remain in effect (meaning this test can be used) for the duration of the COVID-19 declaration under Section 564(b)(1) of the Act, 21 U.S.C. section 360bbb-3(b)(1), unless the  authorization is terminated or revoked sooner.  Performed at [facility], [address], [place], [place] [address]   Urine Culture     Status: Abnormal   Collection Time: 06/28/22  4:02 PM   Specimen: Urine, Clean Catch  Result Value Ref Range Status   Specimen Description   Final    URINE, CLEAN CATCH Performed at [facility], [address] [place], [place] [address]    Special Requests   Final    NONE Performed at [facility], [address] [place], [place] [address]    Culture (A)  Final    >=100,000 COLONIES/mL ESCHERICHIA COLI Confirmed Extended Spectrum Beta-Lactamase Producer (ESBL).  In bloodstream infections from ESBL organisms, carbapenems are preferred over piperacillin/tazobactam. They are shown to have a lower risk of mortality.    Report Status 07/01/2022 FINAL  Final   Organism ID, Bacteria ESCHERICHIA COLI (A)  Final      Susceptibility   Escherichia coli - MIC*    AMPICILLIN >=32 RESISTANT Resistant     CEFAZOLIN >=64 RESISTANT Resistant     CEFEPIME 2 SENSITIVE Sensitive     CEFTRIAXONE >=64 RESISTANT Resistant     CIPROFLOXACIN >=4 RESISTANT Resistant     GENTAMICIN <=1 SENSITIVE Sensitive     IMIPENEM <=0.25 SENSITIVE Sensitive     NITROFURANTOIN <=16 SENSITIVE Sensitive     TRIMETH/SULFA <=20 SENSITIVE Sensitive     AMPICILLIN/SULBACTAM 4 SENSITIVE Sensitive     PIP/TAZO <=4 SENSITIVE Sensitive     * >=100,000 COLONIES/mL ESCHERICHIA COLI    '[x]'$  Treated with cephalexin, organism resistant to prescribed antimicrobial '[]'$  Patient  discharged originally without antimicrobial agent and treatment is now indicated  New antibiotic prescription: DC cephalexin, start macrobid '100mg'$  PO BID x 5 days  ED Provider: Benedetto Goad, PA-C   [place], Rande Lawman 07/01/2022, 8:39 AM Clinical Pharmacist Monday - Friday phone -  [phone] Saturday - Sunday phone - [phone]

## 2022-07-02 ENCOUNTER — Telehealth: Payer: Self-pay | Admitting: Gastroenterology

## 2022-07-02 ENCOUNTER — Telehealth: Payer: Self-pay | Admitting: Family Medicine

## 2022-07-02 ENCOUNTER — Ambulatory Visit: Payer: Medicare Other | Admitting: Family Medicine

## 2022-07-02 NOTE — Telephone Encounter (Signed)
Pt called stating that she wanted to let Dr. Charlett Blake know that her diarrhea has returned and may need a stool sample done.

## 2022-07-02 NOTE — Telephone Encounter (Signed)
See result note.  

## 2022-07-02 NOTE — Telephone Encounter (Signed)
Patient returned your call. Requesting a call back. please call to advise.

## 2022-07-02 NOTE — Telephone Encounter (Signed)
Inbound call from patient returning call. Further advised patient of result notes. Patient advised understanding. Please give a call back if needing to further advise.  Thank you

## 2022-07-03 ENCOUNTER — Other Ambulatory Visit: Payer: Self-pay

## 2022-07-03 DIAGNOSIS — Z8619 Personal history of other infectious and parasitic diseases: Secondary | ICD-10-CM

## 2022-07-03 DIAGNOSIS — R197 Diarrhea, unspecified: Secondary | ICD-10-CM

## 2022-07-03 NOTE — Telephone Encounter (Signed)
Called pt [facility] to come pickup stool test kit

## 2022-07-03 NOTE — Telephone Encounter (Signed)
Pt picked up kit this afternoon.

## 2022-07-06 ENCOUNTER — Other Ambulatory Visit: Payer: Medicare Other

## 2022-07-06 DIAGNOSIS — R197 Diarrhea, unspecified: Secondary | ICD-10-CM | POA: Diagnosis not present

## 2022-07-06 DIAGNOSIS — Z8619 Personal history of other infectious and parasitic diseases: Secondary | ICD-10-CM

## 2022-07-07 LAB — FECAL LACTOFERRIN, QUANT
Fecal Lactoferrin: NEGATIVE
MICRO NUMBER:: 13840381
SPECIMEN QUALITY:: ADEQUATE

## 2022-07-07 LAB — CLOSTRIDIUM DIFFICILE BY PCR: Toxigenic C. Difficile by PCR: NEGATIVE

## 2022-07-09 ENCOUNTER — Ambulatory Visit: Payer: Medicare Other

## 2022-07-09 ENCOUNTER — Other Ambulatory Visit: Payer: Medicare Other

## 2022-07-09 ENCOUNTER — Ambulatory Visit: Payer: Medicare Other | Admitting: Hematology & Oncology

## 2022-07-15 DIAGNOSIS — G4733 Obstructive sleep apnea (adult) (pediatric): Secondary | ICD-10-CM | POA: Diagnosis not present

## 2022-07-21 ENCOUNTER — Encounter (HOSPITAL_BASED_OUTPATIENT_CLINIC_OR_DEPARTMENT_OTHER): Payer: Self-pay | Admitting: Emergency Medicine

## 2022-07-21 ENCOUNTER — Emergency Department (HOSPITAL_BASED_OUTPATIENT_CLINIC_OR_DEPARTMENT_OTHER): Payer: Medicare Other

## 2022-07-21 ENCOUNTER — Encounter: Payer: Self-pay | Admitting: Hematology & Oncology

## 2022-07-21 ENCOUNTER — Other Ambulatory Visit (HOSPITAL_BASED_OUTPATIENT_CLINIC_OR_DEPARTMENT_OTHER): Payer: Self-pay

## 2022-07-21 ENCOUNTER — Ambulatory Visit (INDEPENDENT_AMBULATORY_CARE_PROVIDER_SITE_OTHER): Payer: Medicare Other | Admitting: Family Medicine

## 2022-07-21 ENCOUNTER — Emergency Department (HOSPITAL_BASED_OUTPATIENT_CLINIC_OR_DEPARTMENT_OTHER)
Admission: EM | Admit: 2022-07-21 | Discharge: 2022-07-21 | Disposition: A | Discharge status: Home / Self Care | Payer: Medicare Other | Attending: Emergency Medicine | Admitting: Emergency Medicine

## 2022-07-21 ENCOUNTER — Encounter: Payer: Self-pay | Admitting: Family Medicine

## 2022-07-21 ENCOUNTER — Other Ambulatory Visit: Payer: Self-pay

## 2022-07-21 VITALS — BP 148/100 | HR 100 | Temp 99.0°F | Resp 20 | Ht 65.0 in | Wt 195.6 lb

## 2022-07-21 DIAGNOSIS — R1013 Epigastric pain: Secondary | ICD-10-CM | POA: Insufficient documentation

## 2022-07-21 DIAGNOSIS — I509 Heart failure, unspecified: Secondary | ICD-10-CM | POA: Diagnosis not present

## 2022-07-21 DIAGNOSIS — C9000 Multiple myeloma not having achieved remission: Secondary | ICD-10-CM | POA: Diagnosis not present

## 2022-07-21 DIAGNOSIS — R109 Unspecified abdominal pain: Secondary | ICD-10-CM | POA: Diagnosis not present

## 2022-07-21 DIAGNOSIS — R0981 Nasal congestion: Secondary | ICD-10-CM | POA: Diagnosis present

## 2022-07-21 DIAGNOSIS — U071 COVID-19: Secondary | ICD-10-CM | POA: Insufficient documentation

## 2022-07-21 DIAGNOSIS — R197 Diarrhea, unspecified: Secondary | ICD-10-CM | POA: Insufficient documentation

## 2022-07-21 DIAGNOSIS — Z7982 Long term (current) use of aspirin: Secondary | ICD-10-CM | POA: Insufficient documentation

## 2022-07-21 DIAGNOSIS — D696 Thrombocytopenia, unspecified: Secondary | ICD-10-CM | POA: Insufficient documentation

## 2022-07-21 LAB — URINALYSIS, MICROSCOPIC (REFLEX)

## 2022-07-21 LAB — CBC WITH DIFFERENTIAL/PLATELET
Abs Immature Granulocytes: 0.01 10*3/uL (ref 0.00–0.07)
Basophils Absolute: 0 10*3/uL (ref 0.0–0.1)
Basophils Relative: 1 %
Eosinophils Absolute: 0 10*3/uL (ref 0.0–0.5)
Eosinophils Relative: 1 %
HCT: 37.8 % (ref 36.0–46.0)
Hemoglobin: 12.4 g/dL (ref 12.0–15.0)
Immature Granulocytes: 0 %
Lymphocytes Relative: 17 %
Lymphs Abs: 0.5 10*3/uL — ABNORMAL LOW (ref 0.7–4.0)
MCH: 29 pg (ref 26.0–34.0)
MCHC: 32.8 g/dL (ref 30.0–36.0)
MCV: 88.5 fL (ref 80.0–100.0)
Monocytes Absolute: 0.4 10*3/uL (ref 0.1–1.0)
Monocytes Relative: 12 %
Neutro Abs: 2.2 10*3/uL (ref 1.7–7.7)
Neutrophils Relative %: 69 %
Platelets: 118 10*3/uL — ABNORMAL LOW (ref 150–400)
RBC: 4.27 MIL/uL (ref 3.87–5.11)
RDW: 14.1 % (ref 11.5–15.5)
WBC: 3.2 10*3/uL — ABNORMAL LOW (ref 4.0–10.5)
nRBC: 0 % (ref 0.0–0.2)

## 2022-07-21 LAB — C DIFFICILE QUICK SCREEN W PCR REFLEX
C Diff antigen: NEGATIVE
C Diff interpretation: NOT DETECTED
C Diff toxin: NEGATIVE

## 2022-07-21 LAB — COMPREHENSIVE METABOLIC PANEL
ALT: 34 U/L (ref 0–44)
AST: 73 U/L — ABNORMAL HIGH (ref 15–41)
Albumin: 3.6 g/dL (ref 3.5–5.0)
Alkaline Phosphatase: 53 U/L (ref 38–126)
Anion gap: 8 (ref 5–15)
BUN: 10 mg/dL (ref 8–23)
CO2: 26 mmol/L (ref 22–32)
Calcium: 9.3 mg/dL (ref 8.9–10.3)
Chloride: 102 mmol/L (ref 98–111)
Creatinine, Ser: 0.67 mg/dL (ref 0.44–1.00)
GFR, Estimated: >60 mL/min (ref >60)
Glucose, Bld: 119 mg/dL — ABNORMAL HIGH (ref 70–99)
Potassium: 4.6 mmol/L (ref 3.5–5.1)
Sodium: 136 mmol/L (ref 135–145)
Total Bilirubin: 0.7 mg/dL (ref 0.3–1.2)
Total Protein: 9.5 g/dL — ABNORMAL HIGH (ref 6.5–8.1)

## 2022-07-21 LAB — URINALYSIS, ROUTINE W REFLEX MICROSCOPIC
Bilirubin Urine: NEGATIVE
Glucose, UA: NEGATIVE mg/dL
Ketones, ur: NEGATIVE mg/dL
Leukocytes,Ua: NEGATIVE
Nitrite: NEGATIVE
Protein, ur: NEGATIVE mg/dL
Specific Gravity, Urine: 1.02 (ref 1.005–1.030)
pH: 7.5 (ref 5.0–8.0)

## 2022-07-21 LAB — RESP PANEL BY RT-PCR (FLU A&B, COVID) ARPGX2
Influenza A by PCR: NEGATIVE
Influenza B by PCR: NEGATIVE
SARS Coronavirus 2 by RT PCR: POSITIVE — AB

## 2022-07-21 LAB — MAGNESIUM: Magnesium: 1.7 mg/dL (ref 1.7–2.4)

## 2022-07-21 LAB — LIPASE, BLOOD: Lipase: 34 U/L (ref 11–51)

## 2022-07-21 MED ORDER — LOPERAMIDE HCL 2 MG PO CAPS
2.0000 mg | ORAL_CAPSULE | Freq: Four times a day (QID) | ORAL | 0 refills | Status: DC | PRN
  Filled 2022-07-21: qty 12, 3d supply, fill #0

## 2022-07-21 MED ORDER — MOLNUPIRAVIR EUA 200MG CAPSULE
4.0000 | ORAL_CAPSULE | Freq: Two times a day (BID) | ORAL | 0 refills | Status: AC
  Filled 2022-07-21: qty 40, 5d supply, fill #0

## 2022-07-21 MED ORDER — PROMETHAZINE HCL 25 MG PO TABS
25.0000 mg | ORAL_TABLET | Freq: Three times a day (TID) | ORAL | 0 refills | Status: DC | PRN
  Filled 2022-07-21 (×2): qty 15, 5d supply, fill #0

## 2022-07-21 MED ORDER — LACTATED RINGERS IV BOLUS
1000.0000 mL | Freq: Once | INTRAVENOUS | Status: AC
  Administered 2022-07-21: 1000 mL via INTRAVENOUS

## 2022-07-21 MED ORDER — IOHEXOL 300 MG/ML  SOLN
100.0000 mL | Freq: Once | INTRAMUSCULAR | Status: AC | PRN
  Administered 2022-07-21: 100 mL via INTRAVENOUS

## 2022-07-21 MED ORDER — CARVEDILOL 12.5 MG PO TABS
25.0000 mg | ORAL_TABLET | Freq: Once | ORAL | Status: AC
  Administered 2022-07-21: 25 mg via ORAL
  Filled 2022-07-21: qty 2

## 2022-07-21 MED ORDER — KETOROLAC TROMETHAMINE 30 MG/ML IJ SOLN
30.0000 mg | Freq: Once | INTRAMUSCULAR | Status: AC
  Administered 2022-07-21: 30 mg via INTRAVENOUS
  Filled 2022-07-21: qty 1

## 2022-07-21 MED ORDER — SPIRONOLACTONE 25 MG PO TABS
12.5000 mg | ORAL_TABLET | Freq: Once | ORAL | Status: AC
  Administered 2022-07-21: 12.5 mg via ORAL
  Filled 2022-07-21: qty 0.5

## 2022-07-21 MED ORDER — LOSARTAN POTASSIUM 25 MG PO TABS
50.0000 mg | ORAL_TABLET | Freq: Once | ORAL | Status: AC
  Administered 2022-07-21: 50 mg via ORAL
  Filled 2022-07-21: qty 2

## 2022-07-21 NOTE — ED Provider Notes (Signed)
[facility] EMERGENCY DEPARTMENT Provider Note   CSN: [id number] Arrival date & time: 07/21/22  1037     History  Chief Complaint  Patient presents with   Diarrhea    Tami [name] is a 66 y.o. female.  With PMH of CHF, OSA, GERD, multiple myeloma in remission who presents with generally feeling unwell with viral URI symptoms associated with approximately a week of nonbloody diarrhea and loose stools.  Patient says symptoms started this past week with up to 6 loose nonbloody stools per day associated with minimal upper abdominal epigastrium discomfort.  She did note recently finishing a course of antibiotics for UTI but denies any further urinary symptoms.  She denies any out of country travel recently.  She has had no fevers but did note new development of nonproductive cough, congestion and rhinorrhea as well as mild generalized headache and body aches.  She went to see her PCP this morning but was sent down to the ER for evaluation.  She has not taken any of her home medications today nor has she drank or ate much today.  She denies any chest pain or shortness of breath or nausea or vomiting.   Diarrhea      Home Medications Prior to Admission medications   Medication Sig Start Date End Date Taking? Authorizing Provider  loperamide (IMODIUM) 2 MG capsule Take 1 capsule (2 mg total) by mouth 4 (four) times daily as needed for diarrhea or loose stools. 07/21/22  Yes Branham, Victoria C, MD  molnupiravir EUA (LAGEVRIO) 200 mg CAPS capsule Take 4 capsules (800 mg total) by mouth 2 (two) times daily for 5 days. 07/21/22 07/26/22 Yes Branham, Victoria C, MD  promethazine (PHENERGAN) 25 MG tablet Take 1 tablet (25 mg total) by mouth every 8 (eight) hours as needed for up to 15 doses for nausea or vomiting. 07/21/22  Yes Branham, Victoria C, MD  acetaminophen (TYLENOL) 500 MG tablet Take 500 mg by mouth in the morning.    [provider]  albuterol (VENTOLIN HFA) 108 (90  Base) MCG/ACT inhaler Inhale 2 puffs into the lungs every 6 (six) hours as needed for wheezing or shortness of breath. 11/20/21   Blyth, Stacey A, MD  aspirin EC 81 MG tablet Take 1 tablet (81 mg total) by mouth daily. 02/15/18   Blyth, Stacey A, MD  carvedilol (COREG) 25 MG tablet Take 1 tablet (25 mg total) by mouth 2 (two) times daily with a meal. 06/04/20   Blyth, Stacey A, MD  cetirizine (ZYRTEC) 10 MG tablet Take 10 mg by mouth daily as needed for allergies.    [provider]  Cholecalciferol (VITAMIN D-3) 25 MCG (1000 UT) CAPS Take 1 capsule (1,000 Units total) by mouth daily. 05/03/20   Blyth, Stacey A, MD  colestipol (COLESTID) 1 g tablet Take 2 tablets (2 g total) by mouth every 12 (twelve) hours. 04/30/22   Xu, Fang, MD  diphenoxylate-atropine (LOMOTIL) 2.5-0.025 MG tablet Take 1 tablet by mouth 4 (four) times daily as needed for diarrhea or loose stools. 06/26/22   Ennever, Peter R, MD  fluticasone (FLONASE) 50 MCG/ACT nasal spray Place 2 sprays into both nostrils daily. Patient taking differently: Place 2 sprays into both nostrils every evening. 01/07/20   Joy, Shawn C, PA-C  furosemide (LASIX) 20 MG tablet Take 1 tablet by mouth daily as needed for fluid. 07/03/20   [provider]  hyoscyamine (LEVSIN SL) 0.125 MG SL tablet Place 1 tablet (0.125 mg total)   under the tongue every 4 (four) hours as needed. 05/21/22   Mosie Lukes, MD  ibuprofen (ADVIL) 800 MG tablet Take 1 tablet (800 mg total) by mouth 3 (three) times daily. Patient taking differently: Take 800 mg by mouth every 8 (eight) hours as needed for moderate pain. 08/25/21   Suzy Bouchard, PA-C  loratadine (CLARITIN) 10 MG tablet Take 1 tablet (10 mg total) by mouth daily. Patient taking differently: Take 10 mg by mouth daily as needed for allergies. 09/08/21   Shelda Pal, DO  losartan (COZAAR) 50 MG tablet Take 50 mg by mouth daily. 04/14/21   [provider]  magnesium oxide (MAG-OX) 400  MG tablet Take 1 tablet (400 mg total) by mouth 2 (two) times daily. Hold if you have diarrhea 04/30/22   Florencia Reasons, MD  Multiple Vitamins-Minerals (MULTIVITAMIN ADULT PO) Take 1 tablet by mouth daily.    [provider]  oxyCODONE-acetaminophen (PERCOCET/ROXICET) 5-325 MG tablet Take 1 tablet by mouth every 6 (six) hours as needed for severe pain.    [provider]  pantoprazole (PROTONIX) 40 MG tablet Take 1 tablet (40 mg total) by mouth 2 (two) times daily. 05/28/22   Jackquline Denmark, MD  potassium chloride SA (KLOR-CON M) 20 MEQ tablet TAKE 1 TABLET BY MOUTH EVERY MONDAY, [place], AND FRIDAY. FOLLOW UP WITH MDTO MONITOR POTASSIUM LEVEL 05/27/22   Mosie Lukes, MD  Probiotic Product (PROBIOTIC PO) Take 1 capsule by mouth daily.    [provider]  prochlorperazine (COMPAZINE) 10 MG tablet Take 10 mg by mouth every 6 (six) hours as needed (Nausea or vomiting). 06/29/22   [provider]  spironolactone (ALDACTONE) 25 MG tablet Take 12.5 mg by mouth daily.    [provider]  sucralfate (CARAFATE) 1 g tablet Take 1 tablet (1 g total) by mouth 4 (four) times daily -  with meals and at bedtime. 05/21/22   Mosie Lukes, MD      Allergies    Benazepril, Ondansetron hcl, Codeine, and Morphine    Review of Systems   Review of Systems  Gastrointestinal:  Positive for diarrhea.    Physical Exam Updated Vital Signs BP 138/88   Pulse 90   Temp 98.7 F (37.1 C) (Oral)   Resp 18   SpO2 98%  Physical Exam Constitutional: Alert and oriented. Well appearing and in no distress. Eyes: Conjunctivae are normal. ENT      Head: Normocephalic and atraumatic.      Nose: No congestion.      Mouth/Throat: Mucous membranes are moist.      Neck: No stridor. Cardiovascular: S1, S2,  Normal and symmetric distal pulses are present in all extremities.Warm and well perfused. Respiratory: Normal respiratory effort. Breath sounds are normal. Gastrointestinal: Soft  and mild epigastrium ttp and llq ttp, no rebound or guarding Musculoskeletal: Normal range of motion in all extremities. Neurologic: Normal speech and language. No gross focal neurologic deficits are appreciated. Skin: Skin is warm, dry and intact. No rash noted. Psychiatric: Mood and affect are normal. Speech and behavior are normal.  ED Results / Procedures / Treatments   Labs (all labs ordered are listed, but only abnormal results are displayed) Labs Reviewed  RESP PANEL BY RT-PCR (FLU A&B, COVID) ARPGX2 - Abnormal; Notable for the following components:      Result Value   SARS Coronavirus 2 by RT PCR POSITIVE (*)    All other components within normal limits  COMPREHENSIVE METABOLIC PANEL -  Abnormal; Notable for the following components:   Glucose, Bld 119 (*)    Total Protein 9.5 (*)    AST 73 (*)    All other components within normal limits  CBC WITH DIFFERENTIAL/PLATELET - Abnormal; Notable for the following components:   WBC 3.2 (*)    Platelets 118 (*)    Lymphs Abs 0.5 (*)    All other components within normal limits  URINALYSIS, ROUTINE W REFLEX MICROSCOPIC - Abnormal; Notable for the following components:   Hgb urine dipstick TRACE (*)    All other components within normal limits  URINALYSIS, MICROSCOPIC (REFLEX) - Abnormal; Notable for the following components:   Bacteria, UA RARE (*)    All other components within normal limits  C DIFFICILE QUICK SCREEN W PCR REFLEX    GASTROINTESTINAL PANEL BY PCR, STOOL (REPLACES STOOL CULTURE)  LIPASE, BLOOD  MAGNESIUM    EKG None  Radiology CT ABDOMEN PELVIS W CONTRAST  Result Date: 07/21/2022 CLINICAL DATA:  Malaise. Abdominal pain and diarrhea for the past week. COVID positive. History of multiple myeloma. EXAM: CT ABDOMEN AND PELVIS WITH CONTRAST TECHNIQUE: Multidetector CT imaging of the abdomen and pelvis was performed using the standard protocol following bolus administration of intravenous contrast. RADIATION DOSE  REDUCTION: This exam was performed according to the departmental dose-optimization program which includes automated exposure control, adjustment of the mA and/or kV according to patient size and/or use of iterative reconstruction technique. CONTRAST:  100mL OMNIPAQUE IOHEXOL 300 MG/ML  SOLN COMPARISON:  CT enterography dated June 30, 2022. FINDINGS: Lower chest: No acute abnormality. Hepatobiliary: Unchanged small cysts in the right liver. No new focal liver abnormality. Status post cholecystectomy. No biliary dilatation. Pancreas: Unremarkable. No pancreatic ductal dilatation or surrounding inflammatory changes. Spleen: Normal in size without focal abnormality. Adrenals/Urinary Tract: The adrenal glands are unremarkable. Unchanged small bilateral renal simple cysts. No follow-up imaging is recommended. No renal calculi or hydronephrosis. Bladder is unremarkable. Stomach/Bowel: Stomach is within normal limits. Appendix appears normal. No evidence of bowel wall thickening, distention, or inflammatory changes. Vascular/Lymphatic: No significant vascular findings are present. Retroaortic left renal vein again noted. No enlarged abdominal or pelvic lymph nodes. Reproductive: Status post hysterectomy. No adnexal masses. Other: No abdominal wall hernia or abnormality. No abdominopelvic ascites. No pneumoperitoneum. Musculoskeletal: No acute or significant osseous findings. IMPRESSION: 1. No acute intra-abdominal process. Electronically Signed   By: William T Derry M.D.   On: 07/21/2022 12:47    Procedures Procedures  Normal sinus rhythm on constant cardiac monitoring  Medications Ordered in ED Medications  lactated ringers bolus 1,000 mL (0 mLs Intravenous Stopped 07/21/22 1217)  ketorolac (TORADOL) 30 MG/ML injection 30 mg (30 mg Intravenous Given 07/21/22 1121)  losartan (COZAAR) tablet 50 mg (50 mg Oral Given 07/21/22 1135)  carvedilol (COREG) tablet 25 mg (25 mg Oral Given 07/21/22 1135)  spironolactone  (ALDACTONE) tablet 12.5 mg (12.5 mg Oral Given 07/21/22 1135)  iohexol (OMNIPAQUE) 300 MG/ML solution 100 mL (100 mLs Intravenous Contrast Given 07/21/22 1220)    ED Course/ Medical Decision Making/ A&P Clinical Course as of 07/21/22 1748  Tue Jul 21, 2022  1148 Patient has mild leukopenia white blood cell count 3.2 mild thrombocytopenia 118 likely suggestive of viral infection as suspected.  UA with no leukocyte esterase, no nitrite, not consistent with UTI.  Creatinine 0.67, no AKI.  AST slightly elevated at 73 but no right upper quadrant pain, unlikely gallbladder etiology. [VB]  1210 Patient COVID-positive. [VB]  1322 Patient CTAP unremarkable with   no intra-abdominal pathology.  She is feeling much better after medications provided today.  She is not hypoxic and has no respiratory distress or any clinical indications for requirement of hospitalization in the setting of COVID infection.  With underlying risk factors, will discharge with molnupiravir and discussed the most common side effects of medication with the patient.  We will also discharged with as needed Zofran and Imodium.  I discussed continued supportive care measures and follow-up with PCP.  Strict return precaution discussed.  She is in agreement with plan. [VB]    Clinical Course User Index [VB] Elgie Congo, MD                           Medical Decision Making Tami [name] is a 66 y.o. female.  With PMH of CHF, OSA, GERD, multiple myeloma in remission who presents with generally feeling unwell with viral URI symptoms associated with approximately a week of nonbloody diarrhea and loose stools.   Patient symptoms sound most consistent with viral enteritis versus infectious diarrhea.  Her labs were obtained with no evidence of AKI or severe electrolyte abnormalities.  A CTA was obtained with no acute intra-abdominal pathology no evidence of diverticulitis.  She was COVID-positive which was consistent with her symptoms.  Her  UA was negative for UTI.  Her stool studies were sent for C. difficile and infectious diarrhea panel.  She is clinically stable without chest pain or shortness of breath and has no hypoxia or clinical indications for admission or further work-up at this time.  She felt much improved and asymptomatic after IV fluids, home antihypertensive meds and Toradol.  I discharged her with molnupiravir, prn zofran and imodium and strict return precautions. She will follow up with PCP  Amount and/or Complexity of Data Reviewed Labs: ordered. Radiology: ordered.  Risk Prescription drug management.    Final Clinical Impression(s) / ED Diagnoses Final diagnoses:  Diarrhea, unspecified type  COVID-19    Rx / DC Orders ED Discharge Orders          Ordered    molnupiravir EUA (LAGEVRIO) 200 mg CAPS capsule  2 times daily        07/21/22 1325    promethazine (PHENERGAN) 25 MG tablet  Every 8 hours PRN        07/21/22 1325    loperamide (IMODIUM) 2 MG capsule  4 times daily PRN        07/21/22 1325              Elgie Congo, MD 07/21/22 [id number]

## 2022-07-21 NOTE — ED Triage Notes (Signed)
Patient presents to ED from PCP office. Patient was being seen today for "not feeling well". Reports headache, cough, abdominal pain and diarrhea x 1 week.

## 2022-07-21 NOTE — Assessment & Plan Note (Signed)
Recurrent ---- d/w michael in ER-- pt will go downstairs for further eval and fluids

## 2022-07-21 NOTE — Assessment & Plan Note (Signed)
Pt has appointment with hematology / oncology on thursday

## 2022-07-21 NOTE — Discharge Instructions (Signed)
You were seen in the emergency department today for generally feeling unwell with headache, cough, diarrhea.  The CT scan and your blood work was reassuring and unremarkable.  You did test positive for COVID which is likely causing your symptoms.  Take the antiviral medication as prescribed.  Take the nausea medicine Phenergan as needed for any nausea or vomiting.  You can take the Imodium as needed for diarrhea with 3 or more stools a day.  You will be called if your stool test were positive for bacterial infection.  Make sure to keep yourself hydrated and take Tylenol and ibuprofen as needed for pain and fever.  Come back if you have any severe worsening shortness of breath, coughing up blood, extreme fatigue, uncontrollable nausea and vomiting, or any other symptoms concerning to you.

## 2022-07-21 NOTE — Progress Notes (Signed)
Subjective:   By signing my name below, I, Shehryar Baig, attest that this documentation has been prepared under the direction and in the presence of Ann Held, DO  07/21/2022    [name], female    DOB: 05-21-1956, 66 y.o.   MRN: [id number]  Chief Complaint  Patient presents with   Facial Pain    Pt states having facial pain x3 day with dizziness and diarrhea for 1 week. Pt states having vision trouble.     HPI Patient is in today for a office visit.   She complains of diarrhea since last week. She also developed headaches yesterday. She is have watery bowel movements 5x daily. She has informed her oncologist specialist of her symptoms. She had an episode of this in the past and was given fluids to manage her symptoms. She denies having any fevers. She is taking 2.5-0.025 mg lomotil to manage her symptoms and reports no improvement in her symptoms.   She c/o headache, no vomiting     Past Medical History:  Diagnosis Date   Abnormal SPEP 07/26/2016   Arthritis    knees, hands   Back pain 07/14/2016   Bowel obstruction ([place]) 11/2014   C. 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)    TIAs   UTI (urinary tract infection)     Past Surgical History:  Procedure Laterality Date   ABDOMINAL HYSTERECTOMY      menorraghia, 2006, total   CHOLECYSTECTOMY     COLONOSCOPY  2018   [facility] per patient   ESOPHAGOGASTRODUODENOSCOPY  2018   [facility]    KNEE SURGERY     right, repair torn torn cartialage   PACEMAKER INSERTION  08/2014   TONSILLECTOMY     TUBAL LIGATION      Family History  Problem Relation Age of Onset   Diabetes Mother    Hypertension Mother    Heart disease Mother        s/p 1 stent   Hyperlipidemia Mother    Arthritis Mother    Cancer Father        COLON   Colon cancer Father 66   Irritable bowel syndrome Sister    Hyperlipidemia Daughter    Hypertension Daughter    Hypertension Maternal Grandmother    Arthritis Maternal Grandmother    Heart disease Maternal Grandfather        MI   Hypertension Maternal Grandfather    Arthritis Maternal Grandfather    Hypertension Son    Esophageal cancer Neg Hx     Social History   Socioeconomic History   Marital status: Divorced    Spouse name: [name]   Number of children: 3   Years of education: [name]  Highest education level: High school graduate  Occupational History   [name]  Tobacco Use   Smoking status: Never   Smokeless tobacco: Never  Vaping Use   Vaping Use: Never used  Substance and Sexual Activity   Alcohol use: No   Drug use: No   Sexual activity: [name]  Other Topics Concern   [name]  Social History Narrative   Lives at home alone   Retired   Caffeine: coffee   Social Determinants of Health   Financial Resource Strain: Low Risk  (10/17/2021)   Overall Financial Resource Strain (CARDIA)    Difficulty of Paying Living Expenses: Not hard at all  Food Insecurity: No Food Insecurity (10/17/2021)   Hunger Vital Sign    Worried About Running Out of Food in the Last Year: Never true    Ran Out of Food in the Last Year: Never true  Transportation Needs: No Transportation Needs (10/17/2021)   PRAPARE - Hydrologist  (Medical): No    Lack of Transportation (Non-Medical): No  Physical Activity: Insufficiently Active (10/17/2021)   Exercise Vital Sign    Days of Exercise per Week: 2 days    Minutes of Exercise per Session: 30 min  Stress: No Stress Concern Present (10/17/2021)   [place]    Feeling of Stress : Not at all  Social Connections: Moderately Isolated (10/17/2021)   Social Connection and Isolation Panel [NHANES]    Frequency of Communication with Friends and Family: More than three times a week    Frequency of Social Gatherings with Friends and Family: More than three times a week    Attends Religious Services: More than 4 times per year    Active Member of [employer] or Organizations: No    Attends Archivist Meetings: Never    Marital Status: Divorced  Human resources officer Violence: Not At Risk (10/17/2021)   Humiliation, Afraid, Rape, and Kick questionnaire    Fear of Current or Ex-Partner: No    Emotionally Abused: No    Physically Abused: No    Sexually Abused: No    Outpatient Medications Prior to Visit  Medication Sig Dispense Refill   acetaminophen (TYLENOL) 500 MG tablet Take 500 mg by mouth in the morning.     albuterol (VENTOLIN HFA) 108 (90 Base) MCG/ACT inhaler Inhale 2 puffs into the lungs every 6 (six) hours as needed for wheezing or shortness of breath. 18 g 5   aspirin EC 81 MG tablet Take 1 tablet (81 mg total) by mouth daily. 90 tablet 1   carvedilol (COREG) 25 MG tablet Take 1 tablet (25 mg total) by mouth 2 (two) times daily with a meal. 180 tablet 1   cetirizine (ZYRTEC) 10 MG tablet Take 10 mg by mouth daily as needed for allergies.     Cholecalciferol (VITAMIN D-3) 25 MCG (1000 UT) CAPS Take 1 capsule (1,000 Units total) by mouth daily. 60 capsule 2   colestipol (COLESTID) 1 g tablet Take 2 tablets (2 g total) by mouth every 12 (twelve) hours. 120 tablet 1   diphenoxylate-atropine (LOMOTIL) 2.5-0.025  MG tablet Take 1 tablet by mouth 4 (four) times daily as needed for diarrhea or loose stools. 100 tablet 0   fluticasone (FLONASE) 50 MCG/ACT nasal spray Place 2 sprays into both nostrils daily. (Patient taking differently: Place 2 sprays into both nostrils every evening.) 16 g 0   furosemide (  LASIX) 20 MG tablet Take 1 tablet by mouth daily as needed for fluid.     hyoscyamine (LEVSIN SL) 0.125 MG SL tablet Place 1 tablet (0.125 mg total) under the tongue every 4 (four) hours as needed. 30 tablet 1   ibuprofen (ADVIL) 800 MG tablet Take 1 tablet (800 mg total) by mouth 3 (three) times daily. (Patient taking differently: Take 800 mg by mouth every 8 (eight) hours as needed for moderate pain.) 21 tablet 0   loratadine (CLARITIN) 10 MG tablet Take 1 tablet (10 mg total) by mouth daily. (Patient taking differently: Take 10 mg by mouth daily as needed for allergies.) 30 tablet 11   losartan (COZAAR) 50 MG tablet Take 50 mg by mouth daily.     magnesium oxide (MAG-OX) 400 MG tablet Take 1 tablet (400 mg total) by mouth 2 (two) times daily. Hold if you have diarrhea 60 tablet 1   Multiple Vitamins-Minerals (MULTIVITAMIN ADULT PO) Take 1 tablet by mouth daily.     oxyCODONE-acetaminophen (PERCOCET/ROXICET) 5-325 MG tablet Take 1 tablet by mouth every 6 (six) hours as needed for severe pain.     pantoprazole (PROTONIX) 40 MG tablet Take 1 tablet (40 mg total) by mouth 2 (two) times daily. 180 tablet 4   potassium chloride SA (KLOR-CON M) 20 MEQ tablet TAKE 1 TABLET BY MOUTH EVERY MONDAY, WEDNESDAY, AND FRIDAY. FOLLOW UP WITH MDTO MONITOR POTASSIUM LEVEL 30 tablet 0   Probiotic Product (PROBIOTIC PO) Take 1 capsule by mouth daily.     prochlorperazine (COMPAZINE) 10 MG tablet Take 10 mg by mouth every 6 (six) hours as needed (Nausea or vomiting). 30 tablet 1   spironolactone (ALDACTONE) 25 MG tablet Take 12.5 mg by mouth daily.     sucralfate (CARAFATE) 1 g tablet Take 1 tablet (1 g total) by mouth 4 (four)  times daily -  with meals and at bedtime. 120 tablet 2   amoxicillin (AMOXIL) 500 MG tablet Take 1 tablet (500 mg total) by mouth 2 (two) times daily. (Patient not taking: Reported on 06/29/2022) 14 tablet 0   No facility-administered medications prior to visit.    Allergies  Allergen Reactions   Benazepril Anaphylaxis, Swelling and Hives    angioedema Throat and lip swelling   Ondansetron Hcl Hives    Redness and hives post IV admin on 07/05/17   Codeine Nausea And Vomiting   Morphine Hives    Redness and hives noted post IV admin on 07/05/17    Review of Systems  Constitutional:  Positive for malaise/fatigue. Negative for fever.  HENT:  Negative for congestion.   Eyes:  Negative for blurred vision.  Respiratory:  Negative for shortness of breath.   Cardiovascular:  Negative for chest pain, palpitations and leg swelling.  Gastrointestinal:  Positive for diarrhea. Negative for abdominal pain, blood in stool, constipation, nausea and vomiting.  Genitourinary:  Negative for dysuria and frequency.  Musculoskeletal:  Negative for falls.  Skin:  Negative for rash.  Neurological:  Positive for weakness and headaches. Negative for dizziness and loss of consciousness.  Endo/Heme/Allergies:  Negative for environmental allergies.  Psychiatric/Behavioral:  Negative for depression. The patient is not nervous/anxious.        Objective:    Physical Exam Vitals and nursing note reviewed.  Constitutional:      General: She is not in acute distress.    Appearance: Normal appearance. She is not ill-appearing.  HENT:     Head: Normocephalic and atraumatic.  Cardiovascular:  Rate and Rhythm: Regular rhythm. Tachycardia present.  Pulmonary:     Effort: Pulmonary effort is normal.  Skin:    General: Skin is warm and dry.  Neurological:     Mental Status: She is alert and oriented to person, place, and time.     Motor: Weakness present.     Comments: Pt was very weak --- needed help  walking to the exam room   Psychiatric:        Judgment: Judgment normal.     BP (!) 148/100 (BP Location: Left Arm, Patient Position: Sitting, Cuff Size: Large)   Pulse 100   Temp 99 F (37.2 C) (Oral)   Resp 20   Ht _0  (1.651 m)   Wt 195 lb 9.6 oz (88.7 kg)   SpO2 95%   BMI 32.55 kg/m  Wt Readings from Last 3 Encounters:  07/21/22 195 lb 9.6 oz (88.7 kg)  06/29/22 198 lb 6 oz (90 kg)  06/28/22 195 lb (88.5 kg)    Diabetic Foot Exam - Simple   No data filed    Lab Results  Component Value Date   WBC 2.1 (L) 06/28/2022   HGB 12.1 06/28/2022   HCT 37.5 06/28/2022   PLT 106 (L) 06/28/2022   GLUCOSE 80 06/28/2022   CHOL 171 04/21/2022   TRIG 105.0 04/21/2022   HDL 49.40 04/21/2022   LDLCALC 100 (H) 04/21/2022   ALT 18 06/28/2022   AST 31 06/28/2022   NA 140 06/28/2022   K 3.5 06/28/2022   CL 109 06/28/2022   CREATININE 0.65 06/28/2022   BUN 16 06/28/2022   CO2 27 06/28/2022   TSH 1.97 04/21/2022   INR 0.90 07/28/2018   HGBA1C 5.8 04/21/2022    Lab Results  Component Value Date   TSH 1.97 04/21/2022   Lab Results  Component Value Date   WBC 2.1 (L) 06/28/2022   HGB 12.1 06/28/2022   HCT 37.5 06/28/2022   MCV 89.1 06/28/2022   PLT 106 (L) 06/28/2022   Lab Results  Component Value Date   NA 140 06/28/2022   K 3.5 06/28/2022   CHLORIDE 108 12/31/2016   CO2 27 06/28/2022   GLUCOSE 80 06/28/2022   BUN 16 06/28/2022   CREATININE 0.65 06/28/2022   BILITOT 0.5 06/28/2022   ALKPHOS 48 06/28/2022   AST 31 06/28/2022   ALT 18 06/28/2022   PROT 9.1 (H) 06/28/2022   ALBUMIN 3.4 (L) 06/28/2022   CALCIUM 8.8 (L) 06/28/2022   ANIONGAP 4 (L) 06/28/2022   EGFR >90 12/31/2016   GFR 89.77 05/21/2022   Lab Results  Component Value Date   CHOL 171 04/21/2022   Lab Results  Component Value Date   HDL 49.40 04/21/2022   Lab Results  Component Value Date   LDLCALC 100 (H) 04/21/2022   Lab Results  Component Value Date   TRIG 105.0 04/21/2022    Lab Results  Component Value Date   CHOLHDL 3 04/21/2022   Lab Results  Component Value Date   HGBA1C 5.8 04/21/2022       Assessment & Plan:   Problem List Items Addressed This Visit       Unprioritized   Multiple myeloma ([place]) (Chronic)   Multiple myeloma not having achieved remission ([place]) (Chronic)    Pt has appointment with hematology / oncology on thursday      Diarrhea - Primary    Recurrent ---- d/w michael in ER-- pt will go downstairs for further eval and  fluids          No orders of the defined types were placed in this encounter.   IAnn Held, DO, personally preformed the services described in this documentation.  All medical record entries made by the scribe were at my direction and in my presence.  I have reviewed the chart and discharge instructions (if applicable) and agree that the record reflects my personal performance and is accurate and complete. 07/21/2022   I,Shehryar Baig,acting as a Education administrator for [employer], DO.,have documented all relevant documentation on the behalf of Ann Held, DO,as directed by  Ann Held, DO while in the presence of Ann Held, DO.   Ann Held, DO

## 2022-07-22 ENCOUNTER — Other Ambulatory Visit (HOSPITAL_BASED_OUTPATIENT_CLINIC_OR_DEPARTMENT_OTHER): Payer: Self-pay

## 2022-07-22 LAB — GASTROINTESTINAL PANEL BY PCR, STOOL (REPLACES STOOL CULTURE)

## 2022-07-23 ENCOUNTER — Inpatient Hospital Stay: Payer: Medicare Other

## 2022-07-23 ENCOUNTER — Inpatient Hospital Stay: Payer: Medicare Other | Admitting: Hematology & Oncology

## 2022-07-25 DIAGNOSIS — E876 Hypokalemia: Secondary | ICD-10-CM | POA: Diagnosis not present

## 2022-07-25 DIAGNOSIS — G8929 Other chronic pain: Secondary | ICD-10-CM | POA: Diagnosis not present

## 2022-07-25 DIAGNOSIS — E860 Dehydration: Secondary | ICD-10-CM | POA: Diagnosis not present

## 2022-07-25 DIAGNOSIS — K529 Noninfective gastroenteritis and colitis, unspecified: Secondary | ICD-10-CM | POA: Diagnosis not present

## 2022-07-25 DIAGNOSIS — D72819 Decreased white blood cell count, unspecified: Secondary | ICD-10-CM | POA: Diagnosis not present

## 2022-07-25 DIAGNOSIS — R1084 Generalized abdominal pain: Secondary | ICD-10-CM | POA: Diagnosis not present

## 2022-07-26 DIAGNOSIS — E876 Hypokalemia: Secondary | ICD-10-CM | POA: Diagnosis not present

## 2022-07-26 DIAGNOSIS — E860 Dehydration: Secondary | ICD-10-CM | POA: Diagnosis not present

## 2022-07-26 DIAGNOSIS — R1084 Generalized abdominal pain: Secondary | ICD-10-CM | POA: Diagnosis not present

## 2022-07-26 DIAGNOSIS — D72819 Decreased white blood cell count, unspecified: Secondary | ICD-10-CM | POA: Diagnosis not present

## 2022-07-27 ENCOUNTER — Other Ambulatory Visit (HOSPITAL_BASED_OUTPATIENT_CLINIC_OR_DEPARTMENT_OTHER): Payer: Self-pay

## 2022-07-27 ENCOUNTER — Telehealth: Payer: Self-pay | Admitting: Gastroenterology

## 2022-07-27 NOTE — Telephone Encounter (Signed)
Patient called, states she is having diarrhea and abdominal pain. Patient has EGD schedule at [facility] on 10/24. Requesting a call back. Please call to advise.

## 2022-07-28 NOTE — Telephone Encounter (Signed)
Pt stated that she has been having abdominal pain along with Diarrhea: Pt stated that she has recently been in the hospital for these symptoms and was treated: Pt stated that she is still having ongoing issues: Pt was scheduled for an office visit with Ellouise Newer PA on 08/17/2022 at 1:30 PM: Pt made aware Pt verbalized understanding with all questions answered.

## 2022-07-30 ENCOUNTER — Telehealth: Payer: Self-pay | Admitting: Family Medicine

## 2022-07-30 ENCOUNTER — Other Ambulatory Visit: Payer: Self-pay | Admitting: *Deleted

## 2022-07-30 DIAGNOSIS — D500 Iron deficiency anemia secondary to blood loss (chronic): Secondary | ICD-10-CM

## 2022-07-30 DIAGNOSIS — R197 Diarrhea, unspecified: Secondary | ICD-10-CM

## 2022-07-30 DIAGNOSIS — C9000 Multiple myeloma not having achieved remission: Secondary | ICD-10-CM

## 2022-07-30 NOTE — Telephone Encounter (Signed)
Forms faxed into front office Placed in [place] binup front

## 2022-07-30 NOTE — Telephone Encounter (Signed)
Got the forms to review and put in Dr. Charlett Blake bin

## 2022-07-31 ENCOUNTER — Inpatient Hospital Stay: Payer: Medicare Other

## 2022-07-31 ENCOUNTER — Inpatient Hospital Stay: Payer: Medicare Other | Attending: Hematology & Oncology

## 2022-07-31 VITALS — BP 153/85 | HR 73 | Temp 98.0°F | Resp 18

## 2022-07-31 DIAGNOSIS — C9000 Multiple myeloma not having achieved remission: Secondary | ICD-10-CM | POA: Diagnosis not present

## 2022-07-31 DIAGNOSIS — R197 Diarrhea, unspecified: Secondary | ICD-10-CM | POA: Diagnosis not present

## 2022-07-31 DIAGNOSIS — Z79899 Other long term (current) drug therapy: Secondary | ICD-10-CM | POA: Diagnosis not present

## 2022-07-31 DIAGNOSIS — E860 Dehydration: Secondary | ICD-10-CM

## 2022-07-31 DIAGNOSIS — R5383 Other fatigue: Secondary | ICD-10-CM | POA: Insufficient documentation

## 2022-07-31 DIAGNOSIS — D500 Iron deficiency anemia secondary to blood loss (chronic): Secondary | ICD-10-CM

## 2022-07-31 DIAGNOSIS — G8929 Other chronic pain: Secondary | ICD-10-CM | POA: Insufficient documentation

## 2022-07-31 DIAGNOSIS — R1010 Upper abdominal pain, unspecified: Secondary | ICD-10-CM | POA: Diagnosis not present

## 2022-07-31 LAB — CMP (CANCER CENTER ONLY)
ALT: 16 U/L (ref 0–44)
AST: 41 U/L (ref 15–41)
Albumin: 3.7 g/dL (ref 3.5–5.0)
Alkaline Phosphatase: 45 U/L (ref 38–126)
Anion gap: 7 (ref 5–15)
BUN: 18 mg/dL (ref 8–23)
CO2: 27 mmol/L (ref 22–32)
Calcium: 9.6 mg/dL (ref 8.9–10.3)
Chloride: 107 mmol/L (ref 98–111)
Creatinine: 0.68 mg/dL (ref 0.44–1.00)
GFR, Estimated: >60 mL/min (ref >60)
Glucose, Bld: 107 mg/dL — ABNORMAL HIGH (ref 70–99)
Potassium: 3.6 mmol/L (ref 3.5–5.1)
Sodium: 141 mmol/L (ref 135–145)
Total Bilirubin: 0.4 mg/dL (ref 0.3–1.2)
Total Protein: 8.7 g/dL — ABNORMAL HIGH (ref 6.5–8.1)

## 2022-07-31 LAB — CBC WITH DIFFERENTIAL (CANCER CENTER ONLY)
Abs Immature Granulocytes: 0.01 10*3/uL (ref 0.00–0.07)
Basophils Absolute: 0 10*3/uL (ref 0.0–0.1)
Basophils Relative: 1 %
Eosinophils Absolute: 0.1 10*3/uL (ref 0.0–0.5)
Eosinophils Relative: 3 %
HCT: 32.3 % — ABNORMAL LOW (ref 36.0–46.0)
Hemoglobin: 10.6 g/dL — ABNORMAL LOW (ref 12.0–15.0)
Immature Granulocytes: 0 %
Lymphocytes Relative: 43 %
Lymphs Abs: 1.2 10*3/uL (ref 0.7–4.0)
MCH: 29.4 pg (ref 26.0–34.0)
MCHC: 32.8 g/dL (ref 30.0–36.0)
MCV: 89.7 fL (ref 80.0–100.0)
Monocytes Absolute: 0.3 10*3/uL (ref 0.1–1.0)
Monocytes Relative: 10 %
Neutro Abs: 1.2 10*3/uL — ABNORMAL LOW (ref 1.7–7.7)
Neutrophils Relative %: 43 %
Platelet Count: 136 10*3/uL — ABNORMAL LOW (ref 150–400)
RBC: 3.6 MIL/uL — ABNORMAL LOW (ref 3.87–5.11)
RDW: 14.1 % (ref 11.5–15.5)
WBC Count: 2.8 10*3/uL — ABNORMAL LOW (ref 4.0–10.5)
nRBC: 0 % (ref 0.0–0.2)

## 2022-07-31 MED ORDER — SODIUM CHLORIDE 0.9 % IV SOLN: Freq: Once | INTRAVENOUS | Status: AC

## 2022-07-31 NOTE — Patient Instructions (Signed)
Dehydration, Adult Dehydration is a condition in which there is not enough water or other fluids in the body. This happens when a person loses more fluids than he or she takes in. Important organs, such as the kidneys, brain, and heart, cannot function without a proper amount of fluids. Any loss of fluids from the body can lead to dehydration. Dehydration can be mild, moderate, or severe. It should be treated right away to prevent it from becoming severe. What are the causes? Dehydration may be caused by: Conditions that cause loss of water or other fluids, such as diarrhea, vomiting, or sweating or urinating a lot. Not drinking enough fluids, especially when you are ill or doing activities that require a lot of energy. Other illnesses and conditions, such as fever or infection. Certain medicines, such as medicines that remove excess fluid from the body (diuretics). Lack of safe drinking water. Not being able to get enough water and food. What increases the risk? The following factors may make you more likely to develop this condition: Having a long-term (chronic) illness that has not been treated properly, such as diabetes, heart disease, or kidney disease. Being 65 years of age or older. Having a disability. Living in a place that is high in altitude, where thinner, drier air causes more fluid loss. Doing exercises that put stress on your body for a long time (endurance sports). What are the signs or symptoms? Symptoms of dehydration depend on how severe it is. Mild or moderate dehydration Thirst. Dry lips or dry mouth. Dizziness or light-headedness, especially when standing up from a seated position. Muscle cramps. Dark urine. Urine may be the color of tea. Less urine or tears produced than usual. Headache. Severe dehydration Changes in skin. Your skin may be cold and clammy, blotchy, or pale. Your skin also may not return to normal after being lightly pinched and released. Little or  no tears, urine, or sweat. Changes in vital signs, such as rapid breathing and low blood pressure. Your pulse may be weak or may be faster than 100 beats a minute when you are sitting still. Other changes, such as: Feeling very thirsty. Sunken eyes. Cold hands and feet. Confusion. Being very tired (lethargic) or having trouble waking from sleep. Short-term weight loss. Loss of consciousness. How is this diagnosed? This condition is diagnosed based on your symptoms and a physical exam. You may have blood and urine tests to help confirm the diagnosis. How is this treated? Treatment for this condition depends on how severe it is. Treatment should be started right away. Do not wait until dehydration becomes severe. Severe dehydration is an emergency and needs to be treated in a hospital. Mild or moderate dehydration can be treated at home. You may be asked to: Drink more fluids. Drink an oral rehydration solution (ORS). This drink helps restore proper amounts of fluids and salts and minerals in the blood (electrolytes). Severe dehydration can be treated: With IV fluids. By correcting abnormal levels of electrolytes. This is often done by giving electrolytes through a tube that is passed through your nose and into your stomach (nasogastric tube, or NG tube). By treating the underlying cause of dehydration. Follow these instructions at home: Oral rehydration solution If told by your health care provider, drink an ORS: Make an ORS by following instructions on the package. Start by drinking small amounts, about  cup (120 mL) every 5-10 minutes. Slowly increase how much you drink until you have taken the amount recommended by your health   care provider. Eating and drinking        Drink enough clear fluid to keep your urine pale yellow. If you were told to drink an ORS, finish the ORS first and then start slowly drinking other clear fluids. Drink fluids such as: Water. Do not drink only  water. Doing that can lead to hyponatremia, which is having too little salt (sodium) in the body. Water from ice chips you suck on. Fruit juice that you have added water to (diluted fruit juice). Low-calorie sports drinks. Eat foods that contain a healthy balance of electrolytes, such as bananas, oranges, potatoes, tomatoes, and spinach. Do not drink alcohol. Avoid the following: Drinks that contain a lot of sugar. These include high-calorie sports drinks, fruit juice that is not diluted, and soda. Caffeine. Foods that are greasy or contain a lot of fat or sugar. General instructions Take over-the-counter and prescription medicines only as told by your health care provider. Do not take sodium tablets. Doing that can lead to having too much sodium in the body (hypernatremia). Return to your normal activities as told by your health care provider. Ask your health care provider what activities are safe for you. Keep all follow-up visits as told by your health care provider. This is important. Contact a health care provider if: You have muscle cramps, pain, or discomfort, such as: Pain in your abdomen and the pain gets worse or stays in one area (localizes). Stiff neck. You have a rash. You are more irritable than usual. You are sleepier or have a harder time waking than usual. You feel weak or dizzy. You feel very thirsty. Get help right away if you have: Any symptoms of severe dehydration. Symptoms of vomiting, such as: You cannot eat or drink without vomiting. Vomiting gets worse or does not go away. Vomit includes blood or green matter (bile). Symptoms that get worse with treatment. A fever. A severe headache. Problems with urination or bowel movements, such as: Diarrhea that gets worse or does not go away. Blood in your stool (feces). This may cause stool to look black and tarry. Not urinating, or urinating only a small amount of very dark urine, within 6-8 hours. Trouble  breathing. These symptoms may represent a serious problem that is an emergency. Do not wait to see if the symptoms will go away. Get medical help right away. Call your local emergency services (911 in the [place] Do not drive yourself to the hospital. Summary Dehydration is a condition in which there is not enough water or other fluids in the body. This happens when a person loses more fluids than he or she takes in. Treatment for this condition depends on how severe it is. Treatment should be started right away. Do not wait until dehydration becomes severe. Drink enough clear fluid to keep your urine pale yellow. If you were told to drink an oral rehydration solution (ORS), finish the ORS first and then start slowly drinking other clear fluids. Take over-the-counter and prescription medicines only as told by your health care provider. Get help right away if you have any symptoms of severe dehydration. This information is not intended to replace advice given to you by your health care provider. Make sure you discuss any questions you have with your health care provider. Document Revised: 03/04/2022 Document Reviewed: 06/08/2019 Elsevier Patient Education  2023 [employer] Inc[employer]  

## 2022-08-03 ENCOUNTER — Encounter: Payer: Self-pay | Admitting: Hematology & Oncology

## 2022-08-03 ENCOUNTER — Inpatient Hospital Stay (HOSPITAL_BASED_OUTPATIENT_CLINIC_OR_DEPARTMENT_OTHER): Payer: Medicare Other | Admitting: Hematology & Oncology

## 2022-08-03 ENCOUNTER — Inpatient Hospital Stay: Payer: Medicare Other

## 2022-08-03 ENCOUNTER — Other Ambulatory Visit: Payer: Self-pay

## 2022-08-03 VITALS — BP 160/96 | HR 99 | Temp 98.2°F | Resp 18 | Ht 65.0 in | Wt 195.0 lb

## 2022-08-03 DIAGNOSIS — Z79899 Other long term (current) drug therapy: Secondary | ICD-10-CM | POA: Diagnosis not present

## 2022-08-03 DIAGNOSIS — R1010 Upper abdominal pain, unspecified: Secondary | ICD-10-CM | POA: Diagnosis not present

## 2022-08-03 DIAGNOSIS — R197 Diarrhea, unspecified: Secondary | ICD-10-CM | POA: Diagnosis not present

## 2022-08-03 DIAGNOSIS — C9000 Multiple myeloma not having achieved remission: Secondary | ICD-10-CM | POA: Diagnosis not present

## 2022-08-03 DIAGNOSIS — G8929 Other chronic pain: Secondary | ICD-10-CM | POA: Diagnosis not present

## 2022-08-03 DIAGNOSIS — R5383 Other fatigue: Secondary | ICD-10-CM | POA: Diagnosis not present

## 2022-08-03 LAB — CMP (CANCER CENTER ONLY)
ALT: 16 U/L (ref 0–44)
AST: 31 U/L (ref 15–41)
Albumin: 3.8 g/dL (ref 3.5–5.0)
Alkaline Phosphatase: 46 U/L (ref 38–126)
Anion gap: 7 (ref 5–15)
BUN: 15 mg/dL (ref 8–23)
CO2: 29 mmol/L (ref 22–32)
Calcium: 9.7 mg/dL (ref 8.9–10.3)
Chloride: 104 mmol/L (ref 98–111)
Creatinine: 0.67 mg/dL (ref 0.44–1.00)
GFR, Estimated: >60 mL/min (ref >60)
Glucose, Bld: 104 mg/dL — ABNORMAL HIGH (ref 70–99)
Potassium: 3.8 mmol/L (ref 3.5–5.1)
Sodium: 140 mmol/L (ref 135–145)
Total Bilirubin: 0.5 mg/dL (ref 0.3–1.2)
Total Protein: 8.9 g/dL — ABNORMAL HIGH (ref 6.5–8.1)

## 2022-08-03 LAB — CBC WITH DIFFERENTIAL (CANCER CENTER ONLY)
Abs Immature Granulocytes: 0.01 10*3/uL (ref 0.00–0.07)
Basophils Absolute: 0 10*3/uL (ref 0.0–0.1)
Basophils Relative: 1 %
Eosinophils Absolute: 0.1 10*3/uL (ref 0.0–0.5)
Eosinophils Relative: 3 %
HCT: 36.6 % (ref 36.0–46.0)
Hemoglobin: 11.7 g/dL — ABNORMAL LOW (ref 12.0–15.0)
Immature Granulocytes: 0 %
Lymphocytes Relative: 37 %
Lymphs Abs: 0.9 10*3/uL (ref 0.7–4.0)
MCH: 28.5 pg (ref 26.0–34.0)
MCHC: 32 g/dL (ref 30.0–36.0)
MCV: 89.1 fL (ref 80.0–100.0)
Monocytes Absolute: 0.2 10*3/uL (ref 0.1–1.0)
Monocytes Relative: 9 %
Neutro Abs: 1.2 10*3/uL — ABNORMAL LOW (ref 1.7–7.7)
Neutrophils Relative %: 50 %
Platelet Count: 136 10*3/uL — ABNORMAL LOW (ref 150–400)
RBC: 4.11 MIL/uL (ref 3.87–5.11)
RDW: 14.2 % (ref 11.5–15.5)
WBC Count: 2.4 10*3/uL — ABNORMAL LOW (ref 4.0–10.5)
nRBC: 0 % (ref 0.0–0.2)

## 2022-08-03 LAB — LACTATE DEHYDROGENASE: LDH: 267 U/L — ABNORMAL HIGH (ref 98–192)

## 2022-08-03 NOTE — Progress Notes (Signed)
Hematology and Oncology Follow Up Visit  Tami [name] [id number] 1955/12/06 66 y.o. 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  Again, Tami [name] diarrhea continues to be Tami [name] biggest problem.  This really is causing Tami [name] a lot of aggravation.  Tami [name] is followed by Gastroenterology.  I know they are trying to do their best to help with the diarrhea.  I told Tami [name] to stop all magnesium that Tami [name] is taking.  I also told Tami [name] to stop the Protonix as I know this can cause diarrhea on occasion.  I try to see what other medication Tami [name] is taking that might cause diarrhea.  We are not treating Tami [name] for the myeloma.  I cannot imagine that this is amyloid causing all of this.  Over the last saw Tami [name], Tami [name] M spike was 2.1 g/dL.  Tami [name] IgG level was stable at 3000 mg/dL.  I think the Kappa light chain was also stable at 8.1 mg/dL.  Tami [name] just gets very fatigued when Tami [name] has the diarrhea.  Tami [name] does not do a lot when Tami [name] gets diarrhea.  Tami [name] cannot go many places because Tami [name] is afraid of having diarrhea.  There is no bleeding.  Tami [name] has had no fever.  Tami [name] has this chronic upper abdominal pain.  Again no Tami [name] has had multiple endoscopies in the past.  Tami [name] has had no leg swelling.  Tami [name] has had no rashes.  Tami [name] has had no cough..  Tami [name] does have cardiac issues.  I think Tami [name] has a pacemaker in.  This seems to be holding steady.  Overall, I would have to say that Tami [name] performance status is probably ECOG 1.      Medications:  Allergies as of 08/03/2022       Reactions   Benazepril Anaphylaxis, Swelling, Hives    angioedema Throat and lip swelling   Ondansetron Hcl Hives   Redness and hives post IV admin on 07/05/17   Codeine Nausea And Vomiting   Morphine Hives   Redness and hives noted post IV admin on 07/05/17        Medication List        Accurate as of August 03, 2022 11:03 AM. If you have any questions, ask your nurse or doctor.          acetaminophen 500 MG tablet Commonly known as: TYLENOL Take 500 mg by mouth in the morning.   albuterol 108 (90 Base) MCG/ACT inhaler Commonly known as: VENTOLIN HFA Inhale 2 puffs into the lungs every 6 (six) hours as needed for wheezing or shortness of breath.   aspirin EC 81 MG tablet Take 1 tablet (81 mg total) by mouth daily.   carvedilol 25 MG tablet Commonly known as: COREG Take 1 tablet (25 mg total) by mouth 2 (two) times daily with a meal.   cetirizine 10 MG tablet Commonly known as: ZYRTEC Take 10 mg by mouth daily as needed for allergies.   colestipol 1 g tablet Commonly known as: COLESTID Take 2 tablets (2 g total) by mouth every 12 (twelve)  hours.   diphenoxylate-atropine 2.5-0.025 MG tablet Commonly known as: LOMOTIL Take 1 tablet by mouth 4 (four) times daily as needed for diarrhea or loose stools.   fluticasone 50 MCG/ACT nasal spray Commonly known as: FLONASE Place 2 sprays into both nostrils daily. What changed: when to take this   furosemide 20 MG tablet Commonly known as: LASIX Take 1 tablet by mouth daily as needed for fluid.   hyoscyamine 0.125 MG SL tablet Commonly known as: LEVSIN SL Place 1 tablet (0.125 mg total) under the tongue every 4 (four) hours as needed.   ibuprofen 800 MG tablet Commonly known as: ADVIL Take 1 tablet (800 mg total) by mouth 3 (three) times daily. What changed:  when to take this reasons to take this   loperamide 2 MG capsule Commonly known as: IMODIUM Take 1 capsule (2 mg total) by mouth 4 (four) times daily as needed for diarrhea or loose stools.   loratadine  10 MG tablet Commonly known as: CLARITIN Take 1 tablet (10 mg total) by mouth daily. What changed:  when to take this reasons to take this   losartan 50 MG tablet Commonly known as: COZAAR Take 50 mg by mouth daily.   magnesium oxide 400 MG tablet Commonly known as: MAG-OX Take 1 tablet (400 mg total) by mouth 2 (two) times daily. Hold if you have diarrhea   MULTIVITAMIN ADULT PO Take 1 tablet by mouth daily.   oxyCODONE-acetaminophen 5-325 MG tablet Commonly known as: PERCOCET/ROXICET Take 1 tablet by mouth every 6 (six) hours as needed for severe pain.   pantoprazole 40 MG tablet Commonly known as: PROTONIX Take 1 tablet (40 mg total) by mouth 2 (two) times daily.   potassium chloride SA 20 MEQ tablet Commonly known as: KLOR-CON M TAKE 1 TABLET BY MOUTH EVERY MONDAY, WEDNESDAY, AND FRIDAY. FOLLOW UP WITH MDTO MONITOR POTASSIUM LEVEL   PROBIOTIC PO Take 1 capsule by mouth daily.   prochlorperazine 10 MG tablet Commonly known as: COMPAZINE Take 10 mg by mouth every 6 (six) hours as needed (Nausea or vomiting).   promethazine 25 MG tablet Commonly known as: PHENERGAN Take 1 tablet (25 mg total) by mouth every 8 (eight) hours as needed for up to 15 doses for nausea or vomiting.   spironolactone 25 MG tablet Commonly known as: ALDACTONE Take 12.5 mg by mouth daily.   sucralfate 1 g tablet Commonly known as: Carafate Take 1 tablet (1 g total) by mouth 4 (four) times daily -  with meals and at bedtime.   Vitamin D-3 25 MCG (1000 UT) Caps Take 1 capsule (1,000 Units total) by mouth daily.        Allergies:  Allergies  Allergen Reactions   Benazepril Anaphylaxis, Swelling and Hives    angioedema Throat and lip swelling   Ondansetron Hcl Hives    Redness and hives post IV admin on 07/05/17   Codeine Nausea And Vomiting   Morphine Hives    Redness and hives noted post IV admin on 07/05/17    Past Medical History, Surgical history, Social history, and Family  History were reviewed and updated.  Review of Systems: Review of Systems  Constitutional: Negative.   HENT: Negative.    Eyes: Negative.   Respiratory: Negative.    Cardiovascular: Negative.   Gastrointestinal: Negative.   Genitourinary: Negative.   Musculoskeletal: Negative.   Skin: Negative.   Neurological: Negative.   Endo/Heme/Allergies: Negative.   Psychiatric/Behavioral: Negative.        Physical  Exam:  height is '5\' 5"'$  (1.651 m) and weight is 195 lb (88.5 kg). Tami [name] oral temperature is 98.2 F (36.8 C). Tami [name] blood pressure is 160/96 (abnormal) and Tami [name] pulse is 99. Tami [name] respiration is 18 and oxygen saturation is 96%.   Wt Readings from Last 3 Encounters:  08/03/22 195 lb (88.5 kg)  07/21/22 195 lb 9.6 oz (88.7 kg)  06/29/22 198 lb 6 oz (90 kg)    Physical Exam Vitals reviewed.  HENT:     Head: Normocephalic and atraumatic.  Eyes:     Pupils: Pupils are equal, round, and reactive to light.  Cardiovascular:     Rate and Rhythm: Normal rate and regular rhythm.     Heart sounds: Normal heart sounds.  Pulmonary:     Effort: Pulmonary effort is normal.     Breath sounds: Normal breath sounds.  Abdominal:     General: Bowel sounds are normal.     Palpations: Abdomen is soft.  Musculoskeletal:        General: No tenderness or deformity. Normal range of motion.     Cervical back: Normal range of motion.  Lymphadenopathy:     Cervical: No cervical adenopathy.  Skin:    General: Skin is warm and dry.     Findings: No erythema or rash.  Neurological:     Mental Status: Tami [name] is alert and oriented to person, place, and time.  Psychiatric:        Behavior: Behavior normal.        Thought Content: Thought content normal.        Judgment: Judgment normal.     Lab Results  Component Value Date   WBC 2.4 (L) 08/03/2022   HGB 11.7 (L) 08/03/2022   HCT 36.6 08/03/2022   MCV 89.1 08/03/2022   PLT 136 (L) 08/03/2022   Lab Results  Component Value Date   FERRITIN 756  (H) 06/25/2022   IRON 74 06/25/2022   TIBC 329 06/25/2022   UIBC 255 06/25/2022   IRONPCTSAT 23 06/25/2022   Lab Results  Component Value Date   RETICCTPCT 0.8 07/16/2016   RBC 4.11 08/03/2022   RETICCTABS 32,640 07/16/2016   Lab Results  Component Value Date   KPAFRELGTCHN 80.7 (H) 05/28/2022   LAMBDASER 4.7 (L) 05/28/2022   KAPLAMBRATIO 17.17 (H) 05/28/2022   Lab Results  Component Value Date   IGGSERUM 3,238 (H) 05/28/2022   IGGSERUM 3,000 (H) 05/28/2022   IGA 148 05/28/2022   IGA 158 05/28/2022   IGMSERUM 116 05/28/2022   IGMSERUM 134 05/28/2022   Lab Results  Component Value Date   TOTALPROTELP 8.4 05/28/2022   ALBUMINELP 3.4 05/28/2022   A1GS 0.3 05/28/2022   A2GS 1.0 05/28/2022   BETS 1.1 05/28/2022   BETA2SER 0.5 07/16/2016   GAMS 2.6 (H) 05/28/2022   MSPIKE 2.1 (H) 05/28/2022   SPEI Comment 05/01/2022     Chemistry      Component Value Date/Time   NA 140 08/03/2022 0936   NA 143 11/08/2017 1127   NA 140 12/31/2016 1000   K 3.8 08/03/2022 0936   K 4.0 11/08/2017 1127   K 3.6 12/31/2016 1000   CL 104 08/03/2022 0936   CL 107 11/08/2017 1127   CO2 29 08/03/2022 0936   CO2 25 11/08/2017 1127   CO2 24 12/31/2016 1000   BUN 15 08/03/2022 0936   BUN 19 11/08/2017 1127   BUN 22.3 12/31/2016 1000   CREATININE 0.67 08/03/2022 0936   CREATININE  0.9 11/08/2017 1127   CREATININE 0.8 12/31/2016 1000      Component Value Date/Time   CALCIUM 9.7 08/03/2022 0936   CALCIUM 9.9 11/08/2017 1127   CALCIUM 9.5 12/31/2016 1000   ALKPHOS 46 08/03/2022 0936   ALKPHOS 44 11/08/2017 1127   ALKPHOS 51 12/31/2016 1000   AST 31 08/03/2022 0936   AST 28 12/31/2016 1000   ALT 16 08/03/2022 0936   ALT 36 11/08/2017 1127   ALT 16 12/31/2016 1000   BILITOT 0.5 08/03/2022 0936   BILITOT 0.46 12/31/2016 1000       Impression and Plan: Ms. Grassel is a very pleasant 66 yo African American female with a previous IgG kappa MGUS with progression to myeloma.   I really  wish his diarrhea would improve.  Again, I would be hard pressed to said this is anything related to the myeloma.  I think Tami [name] has had biopsy in the past which were unremarkable.  I do not see amyloid as an issue.  We are doing a 24-hour urine on Tami [name] to see what Tami [name] urinary excretion of Kappa light chain is.  I will still hold off on doing any treatment for the myeloma.  I just think this diarrhea is much more of a active issue and that we really need to try to get this under control if possible.  I would like to see Tami [name] back in about 4 weeks or so.  I did leave a message for Dr. Lyndel Safe about Tami [name] diarrhea and hopefully he will be able to do a endoscopy on Tami [name] much sooner than late October.    [name], MD 9/25/202311:03 AM

## 2022-08-04 ENCOUNTER — Inpatient Hospital Stay: Payer: Medicare Other | Admitting: Family

## 2022-08-04 LAB — IGG, IGA, IGM
IgA: 147 mg/dL (ref 87–352)
IgG (Immunoglobin G), Serum: 3069 mg/dL — ABNORMAL HIGH (ref 586–1602)
IgM (Immunoglobulin M), Srm: 133 mg/dL (ref 26–217)

## 2022-08-04 LAB — KAPPA/LAMBDA LIGHT CHAINS
Kappa free light chain: 91.9 mg/L — ABNORMAL HIGH (ref 3.3–19.4)
Kappa, lambda light chain ratio: 11.63 — ABNORMAL HIGH (ref 0.26–1.65)
Lambda free light chains: 7.9 mg/L (ref 5.7–26.3)

## 2022-08-05 ENCOUNTER — Inpatient Hospital Stay: Payer: Medicare Other

## 2022-08-05 DIAGNOSIS — R197 Diarrhea, unspecified: Secondary | ICD-10-CM

## 2022-08-05 DIAGNOSIS — Z79899 Other long term (current) drug therapy: Secondary | ICD-10-CM | POA: Diagnosis not present

## 2022-08-05 DIAGNOSIS — R1010 Upper abdominal pain, unspecified: Secondary | ICD-10-CM | POA: Diagnosis not present

## 2022-08-05 DIAGNOSIS — R5383 Other fatigue: Secondary | ICD-10-CM | POA: Diagnosis not present

## 2022-08-05 DIAGNOSIS — G8929 Other chronic pain: Secondary | ICD-10-CM | POA: Diagnosis not present

## 2022-08-05 DIAGNOSIS — C9000 Multiple myeloma not having achieved remission: Secondary | ICD-10-CM

## 2022-08-06 ENCOUNTER — Telehealth: Payer: Self-pay

## 2022-08-06 ENCOUNTER — Other Ambulatory Visit: Payer: Self-pay

## 2022-08-06 DIAGNOSIS — R109 Unspecified abdominal pain: Secondary | ICD-10-CM

## 2022-08-06 DIAGNOSIS — R1013 Epigastric pain: Secondary | ICD-10-CM

## 2022-08-06 DIAGNOSIS — R197 Diarrhea, unspecified: Secondary | ICD-10-CM

## 2022-08-06 DIAGNOSIS — K219 Gastro-esophageal reflux disease without esophagitis: Secondary | ICD-10-CM

## 2022-08-06 NOTE — Telephone Encounter (Signed)
-----   Message from Jackquline Denmark, MD sent at 08/03/2022  7:57 PM EDT ----- Christianne Borrow, Will do RG   Remo Lipps, Lets get her EGD/colon with miralax prep at [facility], after cardiology clearance with me ASAP. If problems, let me know. We will find time RG   Have cc JL as pt has appt with her for clinic visit (which she should keep)   ----- Message ----- From: [name], MD Sent: 08/03/2022  11:27 AM EDT To: Jackquline Denmark, MD  Merrie Roof: Is her anyway you can get Ms. Luty and for a endoscopy and/or colonoscopy sooner than late October.  This diarrhea is really becoming a problem for her.  I am not sure why she has this diarrhea.  I cannot imagine this is amyloid doing this.  However, biopsies were certainly help [place] out.  Again, I would be incredibly appreciate if you could somehow find a spot in your schedule for her so this could be done sooner than late October.  Thank so much for all you do for all of my patients.  They really like you.  They always tell me how happy you are and how easy you are to talk with.  Laurey Arrow

## 2022-08-06 NOTE — Telephone Encounter (Signed)
Pt was notified of Dr. Lyndel Safe recommendations:  Colonoscopy was added onto the EGD scheduled on 09/01/2022 at [facility] at 7:30: Case ID [id number]: Pt was made aware: Prep instructions were created for pt and sent to pt via mail: Pt made aware: Ambulatory Referral to GI placed in Epic:  Cardiac Clearance request sent to Dr. Adrian Prows Pt verbalized understanding with all questions answered.

## 2022-08-06 NOTE — Telephone Encounter (Signed)
[facility] Pre-operative Risk Assessment     ROSANNE WOHLFARTH June 16, 1956 [id number]  Procedure: Upper Endoscopy/Colonoscopy Anesthesia type:  MAC Procedure Date: 09/01/2022 Provider: Dr. Lyndel Safe   Type of Clearance needed: Cardiac  Medication(s) needing held: N/A   Length of time for medication to be held: N/A  Please review request and advise by either responding to this message or by sending your response to the fax # provided below.  Thank you,  [facility]  Phone: [phone] Fax: [phone] ATTENTION: Gillermina Hu RN

## 2022-08-07 ENCOUNTER — Telehealth: Payer: Self-pay

## 2022-08-07 LAB — PROTEIN ELECTROPHORESIS, SERUM, WITH REFLEX
A/G Ratio: 0.6 — ABNORMAL LOW (ref 0.7–1.7)
Albumin ELP: 3.2 g/dL (ref 2.9–4.4)
Alpha-1-Globulin: 0.2 g/dL (ref 0.0–0.4)
Alpha-2-Globulin: 1 g/dL (ref 0.4–1.0)
Beta Globulin: 1.1 g/dL (ref 0.7–1.3)
Gamma Globulin: 2.6 g/dL — ABNORMAL HIGH (ref 0.4–1.8)
Globulin, Total: 5 g/dL — ABNORMAL HIGH (ref 2.2–3.9)
M-Spike, %: 1.8 g/dL — ABNORMAL HIGH
SPEP Interpretation: 0
Total Protein ELP: 8.2 g/dL (ref 6.0–8.5)

## 2022-08-07 LAB — IMMUNOFIXATION REFLEX, SERUM
IgA: 156 mg/dL (ref 87–352)
IgG (Immunoglobin G), Serum: 2865 mg/dL — ABNORMAL HIGH (ref 586–1602)
IgM (Immunoglobulin M), Srm: 132 mg/dL (ref 26–217)

## 2022-08-07 NOTE — Telephone Encounter (Signed)
-----   Message from [name], MD sent at 08/07/2022  1:55 PM EDT ----- Call and let her know that the myeloma levels are actually little bit better.  I hope that the Gastroenterologist will get her in soon for her evaluation for diarrhea.  Laurey Arrow

## 2022-08-07 NOTE — Telephone Encounter (Signed)
LM for pt regarding lab results and to call back if she has any questions

## 2022-08-10 ENCOUNTER — Inpatient Hospital Stay: Payer: Medicare Other | Attending: Hematology & Oncology

## 2022-08-10 VITALS — BP 163/97 | HR 79 | Temp 97.8°F | Resp 17

## 2022-08-10 DIAGNOSIS — E860 Dehydration: Secondary | ICD-10-CM

## 2022-08-10 DIAGNOSIS — C9000 Multiple myeloma not having achieved remission: Secondary | ICD-10-CM | POA: Diagnosis not present

## 2022-08-10 DIAGNOSIS — R197 Diarrhea, unspecified: Secondary | ICD-10-CM | POA: Insufficient documentation

## 2022-08-10 MED ORDER — SODIUM CHLORIDE 0.9 % IV SOLN: INTRAVENOUS | Status: DC

## 2022-08-10 NOTE — Patient Instructions (Signed)
Dehydration, Adult Dehydration is a condition in which there is not enough water or other fluids in the body. This happens when a person loses more fluids than he or she takes in. Important organs, such as the kidneys, brain, and heart, cannot function without a proper amount of fluids. Any loss of fluids from the body can lead to dehydration. Dehydration can be mild, moderate, or severe. It should be treated right away to prevent it from becoming severe. What are the causes? Dehydration may be caused by: Conditions that cause loss of water or other fluids, such as diarrhea, vomiting, or sweating or urinating a lot. Not drinking enough fluids, especially when you are ill or doing activities that require a lot of energy. Other illnesses and conditions, such as fever or infection. Certain medicines, such as medicines that remove excess fluid from the body (diuretics). Lack of safe drinking water. Not being able to get enough water and food. What increases the risk? The following factors may make you more likely to develop this condition: Having a long-term (chronic) illness that has not been treated properly, such as diabetes, heart disease, or kidney disease. Being 65 years of age or older. Having a disability. Living in a place that is high in altitude, where thinner, drier air causes more fluid loss. Doing exercises that put stress on your body for a long time (endurance sports). What are the signs or symptoms? Symptoms of dehydration depend on how severe it is. Mild or moderate dehydration Thirst. Dry lips or dry mouth. Dizziness or light-headedness, especially when standing up from a seated position. Muscle cramps. Dark urine. Urine may be the color of tea. Less urine or tears produced than usual. Headache. Severe dehydration Changes in skin. Your skin may be cold and clammy, blotchy, or pale. Your skin also may not return to normal after being lightly pinched and released. Little or  no tears, urine, or sweat. Changes in vital signs, such as rapid breathing and low blood pressure. Your pulse may be weak or may be faster than 100 beats a minute when you are sitting still. Other changes, such as: Feeling very thirsty. Sunken eyes. Cold hands and feet. Confusion. Being very tired (lethargic) or having trouble waking from sleep. Short-term weight loss. Loss of consciousness. How is this diagnosed? This condition is diagnosed based on your symptoms and a physical exam. You may have blood and urine tests to help confirm the diagnosis. How is this treated? Treatment for this condition depends on how severe it is. Treatment should be started right away. Do not wait until dehydration becomes severe. Severe dehydration is an emergency and needs to be treated in a hospital. Mild or moderate dehydration can be treated at home. You may be asked to: Drink more fluids. Drink an oral rehydration solution (ORS). This drink helps restore proper amounts of fluids and salts and minerals in the blood (electrolytes). Severe dehydration can be treated: With IV fluids. By correcting abnormal levels of electrolytes. This is often done by giving electrolytes through a tube that is passed through your nose and into your stomach (nasogastric tube, or NG tube). By treating the underlying cause of dehydration. Follow these instructions at home: Oral rehydration solution If told by your health care provider, drink an ORS: Make an ORS by following instructions on the package. Start by drinking small amounts, about  cup (120 mL) every 5-10 minutes. Slowly increase how much you drink until you have taken the amount recommended by your health   care provider. Eating and drinking        Drink enough clear fluid to keep your urine pale yellow. If you were told to drink an ORS, finish the ORS first and then start slowly drinking other clear fluids. Drink fluids such as: Water. Do not drink only  water. Doing that can lead to hyponatremia, which is having too little salt (sodium) in the body. Water from ice chips you suck on. Fruit juice that you have added water to (diluted fruit juice). Low-calorie sports drinks. Eat foods that contain a healthy balance of electrolytes, such as bananas, oranges, potatoes, tomatoes, and spinach. Do not drink alcohol. Avoid the following: Drinks that contain a lot of sugar. These include high-calorie sports drinks, fruit juice that is not diluted, and soda. Caffeine. Foods that are greasy or contain a lot of fat or sugar. General instructions Take over-the-counter and prescription medicines only as told by your health care provider. Do not take sodium tablets. Doing that can lead to having too much sodium in the body (hypernatremia). Return to your normal activities as told by your health care provider. Ask your health care provider what activities are safe for you. Keep all follow-up visits as told by your health care provider. This is important. Contact a health care provider if: You have muscle cramps, pain, or discomfort, such as: Pain in your abdomen and the pain gets worse or stays in one area (localizes). Stiff neck. You have a rash. You are more irritable than usual. You are sleepier or have a harder time waking than usual. You feel weak or dizzy. You feel very thirsty. Get help right away if you have: Any symptoms of severe dehydration. Symptoms of vomiting, such as: You cannot eat or drink without vomiting. Vomiting gets worse or does not go away. Vomit includes blood or green matter (bile). Symptoms that get worse with treatment. A fever. A severe headache. Problems with urination or bowel movements, such as: Diarrhea that gets worse or does not go away. Blood in your stool (feces). This may cause stool to look black and tarry. Not urinating, or urinating only a small amount of very dark urine, within 6-8 hours. Trouble  breathing. These symptoms may represent a serious problem that is an emergency. Do not wait to see if the symptoms will go away. Get medical help right away. Call your local emergency services (911 in the [place] Do not drive yourself to the hospital. Summary Dehydration is a condition in which there is not enough water or other fluids in the body. This happens when a person loses more fluids than he or she takes in. Treatment for this condition depends on how severe it is. Treatment should be started right away. Do not wait until dehydration becomes severe. Drink enough clear fluid to keep your urine pale yellow. If you were told to drink an oral rehydration solution (ORS), finish the ORS first and then start slowly drinking other clear fluids. Take over-the-counter and prescription medicines only as told by your health care provider. Get help right away if you have any symptoms of severe dehydration. This information is not intended to replace advice given to you by your health care provider. Make sure you discuss any questions you have with your health care provider. Document Revised: 03/04/2022 Document Reviewed: 06/08/2019 Elsevier Patient Education  2023 [employer] Inc[employer]  

## 2022-08-11 DIAGNOSIS — I719 Aortic aneurysm of unspecified site, without rupture: Secondary | ICD-10-CM | POA: Diagnosis not present

## 2022-08-11 DIAGNOSIS — R7309 Other abnormal glucose: Secondary | ICD-10-CM | POA: Diagnosis not present

## 2022-08-11 DIAGNOSIS — E611 Iron deficiency: Secondary | ICD-10-CM | POA: Diagnosis not present

## 2022-08-11 DIAGNOSIS — G43909 Migraine, unspecified, not intractable, without status migrainosus: Secondary | ICD-10-CM | POA: Diagnosis not present

## 2022-08-11 DIAGNOSIS — I509 Heart failure, unspecified: Secondary | ICD-10-CM | POA: Diagnosis not present

## 2022-08-11 DIAGNOSIS — E559 Vitamin D deficiency, unspecified: Secondary | ICD-10-CM | POA: Diagnosis not present

## 2022-08-11 DIAGNOSIS — I429 Cardiomyopathy, unspecified: Secondary | ICD-10-CM | POA: Diagnosis not present

## 2022-08-11 DIAGNOSIS — Z79899 Other long term (current) drug therapy: Secondary | ICD-10-CM | POA: Diagnosis not present

## 2022-08-11 DIAGNOSIS — N183 Chronic kidney disease, stage 3 unspecified: Secondary | ICD-10-CM | POA: Diagnosis not present

## 2022-08-11 DIAGNOSIS — R109 Unspecified abdominal pain: Secondary | ICD-10-CM | POA: Diagnosis not present

## 2022-08-11 DIAGNOSIS — E785 Hyperlipidemia, unspecified: Secondary | ICD-10-CM | POA: Diagnosis not present

## 2022-08-11 LAB — UPEP/UIFE/LIGHT CHAINS/TP, 24-HR UR
% BETA, Urine: 0 %
ALPHA 1 URINE: 0 %
Albumin, U: 100 %
Alpha 2, Urine: 0 %
Free Kappa Lt Chains,Ur: 37.36 mg/L (ref 1.17–86.46)
Free Kappa/Lambda Ratio: 16.1 — ABNORMAL HIGH (ref 1.83–14.26)
Free Lambda Lt Chains,Ur: 2.32 mg/L (ref 0.27–15.21)
GAMMA GLOBULIN URINE: 0 %
Total Protein, Urine-Ur/day: 168 mg/24 hr — ABNORMAL HIGH (ref 30–150)
Total Protein, Urine: 11.6 mg/dL
Total Volume: 1450

## 2022-08-12 ENCOUNTER — Telehealth: Payer: Self-pay

## 2022-08-12 NOTE — Telephone Encounter (Signed)
Called and informed patient of lab results, patient verbalized understanding and denies any questions or concerns at this time.   

## 2022-08-12 NOTE — Telephone Encounter (Signed)
-----   Message from [name], MD sent at 08/11/2022  8:33 PM EDT ----- Call - the urine does not show any abnormal protein!!  Laurey Arrow

## 2022-08-13 DIAGNOSIS — Z79899 Other long term (current) drug therapy: Secondary | ICD-10-CM | POA: Diagnosis not present

## 2022-08-14 ENCOUNTER — Other Ambulatory Visit: Payer: Self-pay

## 2022-08-14 ENCOUNTER — Emergency Department (HOSPITAL_BASED_OUTPATIENT_CLINIC_OR_DEPARTMENT_OTHER): Payer: Medicare Other

## 2022-08-14 ENCOUNTER — Encounter (HOSPITAL_BASED_OUTPATIENT_CLINIC_OR_DEPARTMENT_OTHER): Payer: Self-pay | Admitting: Urology

## 2022-08-14 ENCOUNTER — Emergency Department (HOSPITAL_BASED_OUTPATIENT_CLINIC_OR_DEPARTMENT_OTHER)
Admission: EM | Admit: 2022-08-14 | Discharge: 2022-08-14 | Disposition: A | Discharge status: Home / Self Care | Payer: Medicare Other | Attending: Emergency Medicine | Admitting: Emergency Medicine

## 2022-08-14 DIAGNOSIS — R197 Diarrhea, unspecified: Secondary | ICD-10-CM

## 2022-08-14 DIAGNOSIS — G8929 Other chronic pain: Secondary | ICD-10-CM | POA: Insufficient documentation

## 2022-08-14 DIAGNOSIS — R072 Precordial pain: Secondary | ICD-10-CM | POA: Diagnosis not present

## 2022-08-14 DIAGNOSIS — R1013 Epigastric pain: Secondary | ICD-10-CM | POA: Diagnosis not present

## 2022-08-14 DIAGNOSIS — E860 Dehydration: Secondary | ICD-10-CM | POA: Diagnosis not present

## 2022-08-14 DIAGNOSIS — I509 Heart failure, unspecified: Secondary | ICD-10-CM | POA: Diagnosis not present

## 2022-08-14 DIAGNOSIS — R079 Chest pain, unspecified: Secondary | ICD-10-CM | POA: Diagnosis not present

## 2022-08-14 DIAGNOSIS — R531 Weakness: Secondary | ICD-10-CM | POA: Diagnosis not present

## 2022-08-14 DIAGNOSIS — Z8673 Personal history of transient ischemic attack (TIA), and cerebral infarction without residual deficits: Secondary | ICD-10-CM | POA: Insufficient documentation

## 2022-08-14 DIAGNOSIS — I110 Hypertensive heart disease with heart failure: Secondary | ICD-10-CM | POA: Insufficient documentation

## 2022-08-14 DIAGNOSIS — R0789 Other chest pain: Secondary | ICD-10-CM | POA: Diagnosis not present

## 2022-08-14 LAB — COMPREHENSIVE METABOLIC PANEL
ALT: 15 U/L (ref 0–44)
AST: 26 U/L (ref 15–41)
Albumin: 3.2 g/dL — ABNORMAL LOW (ref 3.5–5.0)
Alkaline Phosphatase: 48 U/L (ref 38–126)
Anion gap: 6 (ref 5–15)
BUN: 14 mg/dL (ref 8–23)
CO2: 25 mmol/L (ref 22–32)
Calcium: 8.7 mg/dL — ABNORMAL LOW (ref 8.9–10.3)
Chloride: 107 mmol/L (ref 98–111)
Creatinine, Ser: 0.58 mg/dL (ref 0.44–1.00)
GFR, Estimated: >60 mL/min (ref >60)
Glucose, Bld: 137 mg/dL — ABNORMAL HIGH (ref 70–99)
Potassium: 3.7 mmol/L (ref 3.5–5.1)
Sodium: 138 mmol/L (ref 135–145)
Total Bilirubin: 0.2 mg/dL — ABNORMAL LOW (ref 0.3–1.2)
Total Protein: 8.4 g/dL — ABNORMAL HIGH (ref 6.5–8.1)

## 2022-08-14 LAB — LIPASE, BLOOD: Lipase: 43 U/L (ref 11–51)

## 2022-08-14 LAB — CBC WITH DIFFERENTIAL/PLATELET
Abs Immature Granulocytes: 0 10*3/uL (ref 0.00–0.07)
Basophils Absolute: 0 10*3/uL (ref 0.0–0.1)
Basophils Relative: 1 %
Eosinophils Absolute: 0.1 10*3/uL (ref 0.0–0.5)
Eosinophils Relative: 2 %
HCT: 34.7 % — ABNORMAL LOW (ref 36.0–46.0)
Hemoglobin: 11.5 g/dL — ABNORMAL LOW (ref 12.0–15.0)
Immature Granulocytes: 0 %
Lymphocytes Relative: 40 %
Lymphs Abs: 0.9 10*3/uL (ref 0.7–4.0)
MCH: 29 pg (ref 26.0–34.0)
MCHC: 33.1 g/dL (ref 30.0–36.0)
MCV: 87.6 fL (ref 80.0–100.0)
Monocytes Absolute: 0.3 10*3/uL (ref 0.1–1.0)
Monocytes Relative: 12 %
Neutro Abs: 1 10*3/uL — ABNORMAL LOW (ref 1.7–7.7)
Neutrophils Relative %: 45 %
Platelets: 102 10*3/uL — ABNORMAL LOW (ref 150–400)
RBC: 3.96 MIL/uL (ref 3.87–5.11)
RDW: 13.5 % (ref 11.5–15.5)
WBC: 2.2 10*3/uL — ABNORMAL LOW (ref 4.0–10.5)
nRBC: 0 % (ref 0.0–0.2)

## 2022-08-14 LAB — TROPONIN I (HIGH SENSITIVITY): Troponin I (High Sensitivity): 11 ng/L (ref <18)

## 2022-08-14 MED ORDER — SODIUM CHLORIDE 0.9 % IV BOLUS
1000.0000 mL | Freq: Once | INTRAVENOUS | Status: AC
  Administered 2022-08-14: 1000 mL via INTRAVENOUS

## 2022-08-14 NOTE — Discharge Instructions (Signed)
You are seen in the emergency room today with dehydration and IV fluids. If you develop any worsening chest pain, abdominal pain or other severe symptoms please return to the ED for evaluation. Please keep your follow up appointment with gastroenterology.

## 2022-08-14 NOTE — ED Triage Notes (Signed)
Pt states having chronic diarrhea and states she is weak and dehydrated  States diarrhea x 1 year, states loose stool  Denies any pain  Denies N/V, no blood in stool

## 2022-08-14 NOTE — ED Provider Notes (Signed)
Emergency Department Provider Note   I have reviewed the triage vital signs and the nursing notes.   HISTORY  Chief Complaint Diarrhea and Chest Pain   HPI Tami [name] is a 66 y.o. female  with PMH of chronic diarrhea, followed by GI, presents emergency department with concern for dehydration.  She has had diarrhea for the past year.  Denies any blood or black in the stool.  No fevers or chills.  She is having some epigastric abdominal pain but notes this is also chronic.  She presents at various points for IV fluids and feels that she is in need of that currently.  She is not having vomiting.  Her diarrhea and pain have not changed significantly.  She is due for upper and lower endoscopy with GI later this month. No recent abx.  Has developed some upper chest tightness without shortness of breath.  No exertion or diaphoresis.   Past Medical History:  Diagnosis Date   Abnormal SPEP 07/26/2016   Arthritis    knees, hands   Back pain 07/14/2016   Bowel obstruction (HCC) 11/2014   C. 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 Respiratory: Denies shortness of breath. Gastrointestinal: Positive  epigastric abdominal pain.  No nausea, no vomiting. Positive diarrhea.  No constipation. Genitourinary: Negative for dysuria. Musculoskeletal: Negative for back pain. Skin: Negative for rash. Neurological: Negative for headaches.   ____________________________________________   PHYSICAL EXAM:  VITAL SIGNS: ED Triage Vitals  Enc Vitals Group     BP 08/14/22 1606 (!) 180/100     Pulse Rate 08/14/22 1606 92     Resp 08/14/22 1606 18     Temp 08/14/22 1606 98.2 F (36.8 C)     Temp Source 08/14/22 1606 Oral     SpO2 08/14/22 1606 93 %     Weight 08/14/22 1606 195 lb (88.5 kg)     Height 08/14/22 1606 5\' 5"  (1.651 m)   Constitutional: Alert and oriented. Well appearing and in no acute distress. Eyes: Conjunctivae are normal.  Head: Atraumatic. Nose: No congestion/rhinnorhea. Mouth/Throat: Mucous membranes are moist.   Neck: No stridor.   Cardiovascular: Normal rate, regular rhythm. Good peripheral circulation. Grossly normal heart sounds.   Respiratory: Normal respiratory effort.  No retractions. Lungs CTAB. Gastrointestinal: Soft and nontender. No distention.  Musculoskeletal: No lower extremity tenderness nor edema. No gross deformities of extremities. Neurologic:  Normal speech and language. No gross focal neurologic deficits are appreciated.  Skin:  Skin is warm, dry and intact. No rash noted.  ____________________________________________   LABS (all labs ordered are listed, but only abnormal results are displayed)  Labs  Reviewed  COMPREHENSIVE METABOLIC PANEL - Abnormal; Notable for the following components:      Result Value   Glucose, Bld 137 (*)    Calcium 8.7 (*)    Total Protein 8.4 (*)    Albumin 3.2 (*)    Total Bilirubin 0.2 (*)    All other components within normal limits  CBC WITH DIFFERENTIAL/PLATELET - Abnormal; Notable for the following components:   WBC 2.2 (*)    Hemoglobin 11.5 (*)    HCT 34.7 (*)    Platelets 102 (*)    Neutro Abs 1.0 (*)     All other components within normal limits  LIPASE, BLOOD  TROPONIN I (HIGH SENSITIVITY)  TROPONIN I (HIGH SENSITIVITY)   ____________________________________________  EKG  Rate: 93 PR: 150 QTc: 524  Sinus rhythm, LVH, nonspecific T wave abnormalities.  ____________________________________________  RADIOLOGY  DG Chest 2 View  Result Date: 08/14/2022 CLINICAL DATA:  Chronic diarrhea, weakness, dehydration, chest pain and EXAM: CHEST - 2 VIEW COMPARISON:  10/25/2020 FINDINGS: Frontal and lateral views of the chest demonstrate multi lead pacer, proximal lead within the right atrium, distal lead in the right ventricle, and third lead in the coronary sinus. Cardiac silhouette is unremarkable. No airspace disease, effusion, or pneumothorax. No acute bony abnormality. IMPRESSION: 1. No acute intrathoracic process. Electronically Signed   By: Randa Ngo M.D.   On: 08/14/2022 16:58    ____________________________________________   PROCEDURES  Procedure(s) performed:   Procedures  None  ____________________________________________   INITIAL IMPRESSION / ASSESSMENT AND PLAN / ED COURSE  Pertinent labs & imaging results that were available during my care of the patient were reviewed by me and considered in my medical decision making (see chart for details).   This patient is Presenting for Evaluation of CP/diarrhea, which does require a range of treatment options, and is a complaint that involves a high risk of morbidity and mortality.  The Differential Diagnoses includes but is not exclusive to acute coronary syndrome, aortic dissection, pulmonary embolism, cardiac tamponade, community-acquired pneumonia, pericarditis, musculoskeletal chest wall pain, etc.   Critical Interventions-    Medications  sodium chloride 0.9 % bolus 1,000 mL (1,000 mLs Intravenous New Bag/Given 08/14/22 1639)    Reassessment after intervention: Patient is feeling much better.    I decided to  review pertinent External Data, and in summary patient with prior ED visit in September with CT and labs.   Clinical Laboratory Tests Ordered, included troponin within normal limits.  No acute kidney injury.  LFTs and bilirubin normal.  Lipase normal.  Radiologic Tests Ordered, included CXR. I independently interpreted the images and agree with radiology interpretation.   Cardiac Monitor Tracing which shows NSR.   Social Determinants of Health Risk patient is a non-smoker.   Medical Decision Making: Summary:  Patient presents with chronic diarrhea and clinical concern for dehydration.  Also complaining of some nonspecific chest pain but EKG with no acute ischemic abnormality.  Similar to tracings from August. Will trend troponin.   Reevaluation with update and discussion with patient.  On reassessment her symptoms have mostly resolved.  Her EKG and troponin are reassuring.  Doubt ACS primarily.  Considered admission but symptoms well controlled. Labs reassuring.   Disposition: discharge  ____________________________________________  FINAL CLINICAL IMPRESSION(S) / ED DIAGNOSES  Final diagnoses:  Diarrhea, unspecified type  Dehydration  Precordial chest pain    Note:  This document was prepared using Dragon voice recognition software and may include unintentional dictation errors.  Vonna Kotyk  Laverta Baltimore, MD, [facility]    Jatavis Malek, [facility], MD 08/14/22 1757

## 2022-08-14 NOTE — ED Notes (Signed)
Pt also reports chest pain intermittent as well and has a pacemaker

## 2022-08-17 ENCOUNTER — Ambulatory Visit (INDEPENDENT_AMBULATORY_CARE_PROVIDER_SITE_OTHER): Payer: Medicare Other | Admitting: Physician Assistant

## 2022-08-17 ENCOUNTER — Encounter: Payer: Self-pay | Admitting: Physician Assistant

## 2022-08-17 VITALS — BP 140/82 | HR 94 | Ht 65.0 in | Wt 192.0 lb

## 2022-08-17 DIAGNOSIS — R1013 Epigastric pain: Secondary | ICD-10-CM | POA: Diagnosis not present

## 2022-08-17 DIAGNOSIS — K219 Gastro-esophageal reflux disease without esophagitis: Secondary | ICD-10-CM

## 2022-08-17 DIAGNOSIS — R197 Diarrhea, unspecified: Secondary | ICD-10-CM | POA: Diagnosis not present

## 2022-08-17 DIAGNOSIS — R112 Nausea with vomiting, unspecified: Secondary | ICD-10-CM | POA: Diagnosis not present

## 2022-08-17 DIAGNOSIS — K580 Irritable bowel syndrome with diarrhea: Secondary | ICD-10-CM

## 2022-08-17 NOTE — Progress Notes (Signed)
Chief Complaint: Epigastric abdominal pain and diarrhea  HPI:    Tami [name] is a 66 year old African-American female, known to Dr. Lyndel Safe, with a past medical history as listed below including anxiety, multiple myeloma, hypertension, CHF status post AICD, OSA and history of TIA, who was referred to me by Mosie Lukes, MD for a complaint of epigastric abdominal pain and diarrhea.      Dr. Lyndel Safe previously contacted by patient's oncologist with request for endoscopy and colonoscopy.  These are scheduled 09/01/2022 at [facility] at 730.  Cardiac clearance was previously sent to Dr. Adrian Prows on 08/06/2022.    05/28/2022 office visit with Dr. Lyndel Safe for epigastric pain with nausea and vomiting which was thought possibly functional.  Negative GES February 2023 and negative previous extensive GI work-up.  Discussed history of recent PSBO due to adhesions, CT 04/2022 with a stable 1 cm polypoid appearing area of soft tissue attenuation within the lumen of the loop of distal small bowel.  IBS-D with previous history of constipation, negative colon to TI intubation and random colon biopsy December 2022, negative stool studies, multiple CTs.  Discussed possible bile acid diarrhea status postcholecystectomy or due to meds (magnesium).  At that time ordered CTE as below and continued Colestipol 2 g twice daily as well as restarted Protonix 40 mg twice daily.    06/30/2022 CT enteroabdomen and pelvis with contrast with no acute findings.    07/21/2022 CT the abdomen pelvis with contrast showed no acute intra-abdominal process.    08/14/2022 ER visit for ongoing diarrhea and epigastric abdominal pain.  WBC low at 2.2 (appears consistent over the past 2 months), hemoglobin 11.5 (around baseline).  Platelets 102.  CMP with albumin low at 3.2.  Lipase normal.    Today, the patient tells me that she has continued with diarrhea and epigastric pain though the nausea and vomiting has gotten a little bit better.   She was seen by Dr. Lyndel Safe in late July as above but has been back to the ER on multiple occasions typically for dehydration.  Patient tells me she continues with 5 or more loose watery stools a day regardless of taking for Loperamide and 2 Lomotil along with her 2 g of Colestid twice daily.  Tells me she tries to stay hydrated and eat enough food, but tends to just feel ill.  Continues with abdominal pain/discomfort regardless of Tylenol.  She has tried Hyoscyamine 0.125 mg sublingual tabs but does not feel like it helps that much.  The only thing that even slightly touches the pain is Ibuprofen 800 mg twice a day which she does not have any more of.  Recently Dr. Marin Olp her oncologist stopped her magnesium oxide thinking this was adding to her diarrhea and also stopped her Pantoprazole which she was taking 40 mg twice a day because it was not helping with epigastric pain and he thought may be also adding to diarrhea.  Patient tells me she is frustrated with ongoing symptoms.  She has been on Carafate previously with no help either.    Denies fever, chills, weight loss or blood in her stool.  Past Medical History:  Diagnosis Date   Abnormal SPEP 07/26/2016   Arthritis    knees, hands   Back pain 07/14/2016   Bowel obstruction ([place]) 11/2014   C. 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/2016   Multiple myeloma ([place]) 07/20/2018   Multiple myeloma not having achieved remission ([place]) 07/20/2018   Myalgia 12/31/2016   Obstructive sleep apnea 03/31/2015   SOB  (shortness of breath) 04/09/2016   Stroke ([place])    TIAs   UTI (urinary tract infection)     Past Surgical History:  Procedure Laterality Date   ABDOMINAL HYSTERECTOMY     menorraghia, 2006, total   CHOLECYSTECTOMY     COLONOSCOPY  2018   [facility] per patient   ESOPHAGOGASTRODUODENOSCOPY  2018   [facility]    KNEE SURGERY     right, repair torn torn cartialage   PACEMAKER INSERTION  08/2014   TONSILLECTOMY     TUBAL LIGATION      Current Outpatient Medications  Medication Sig Dispense Refill   acetaminophen (TYLENOL) 500 MG tablet Take 500 mg by mouth in the morning.     albuterol (VENTOLIN HFA) 108 (90 Base) MCG/ACT inhaler Inhale 2 puffs into the lungs every 6 (six) hours as needed for wheezing or shortness of breath. 18 g 5   aspirin EC 81 MG tablet Take 1 tablet (81 mg total) by mouth daily. 90 tablet 1   carvedilol (COREG) 25 MG tablet Take 1 tablet (25 mg total) by mouth 2 (two) times daily with a meal. 180 tablet 1   cetirizine (ZYRTEC) 10 MG tablet Take 10 mg by mouth daily as needed for allergies.     Cholecalciferol (VITAMIN D-3) 25 MCG (1000 UT) CAPS Take 1 capsule (1,000 Units total) by mouth daily. 60 capsule 2   colestipol (COLESTID) 1 g tablet Take 2 tablets (2 g total) by mouth every 12 (twelve) hours. 120 tablet 1   diphenoxylate-atropine (LOMOTIL) 2.5-0.025 MG tablet Take 1 tablet by mouth 4 (four) times daily as needed for diarrhea or loose stools. 100 tablet 0   fluticasone (FLONASE) 50 MCG/ACT nasal spray Place 2 sprays into both nostrils daily. (Patient taking differently: Place 2 sprays into both nostrils every evening.) 16 g 0   furosemide (LASIX) 20 MG tablet Take 1 tablet by mouth daily as needed for fluid.     hyoscyamine (LEVSIN SL) 0.125 MG SL tablet Place 1 tablet (0.125 mg total) under the tongue every 4 (four) hours as needed. 30 tablet 1   ibuprofen (ADVIL) 800 MG tablet Take 1 tablet (800 mg total) by mouth 3 (three) times  daily. (Patient taking differently: Take 800 mg by mouth every 8 (eight) hours as needed for moderate pain.) 21 tablet 0   loperamide (IMODIUM) 2 MG capsule Take 1 capsule (2 mg total) by mouth 4 (four) times daily as needed for diarrhea or loose stools. 12 capsule 0   loratadine (CLARITIN) 10 MG tablet Take 1 tablet (10 mg total) by mouth daily. (Patient taking differently: Take 10 mg by mouth daily as needed for allergies.) 30 tablet 11   losartan (COZAAR) 50 MG tablet Take 50 mg by mouth daily.     magnesium oxide (MAG-OX) 400 MG tablet Take 1 tablet (400 mg total) by mouth 2 (two) times daily. Hold if you have diarrhea 60 tablet  1   Multiple Vitamins-Minerals (MULTIVITAMIN ADULT PO) Take 1 tablet by mouth daily.     oxyCODONE-acetaminophen (PERCOCET/ROXICET) 5-325 MG tablet Take 1 tablet by mouth every 6 (six) hours as needed for severe pain.     pantoprazole (PROTONIX) 40 MG tablet Take 1 tablet (40 mg total) by mouth 2 (two) times daily. 180 tablet 4   potassium chloride SA (KLOR-CON M) 20 MEQ tablet TAKE 1 TABLET BY MOUTH EVERY MONDAY, WEDNESDAY, AND FRIDAY. FOLLOW UP WITH MDTO MONITOR POTASSIUM LEVEL 30 tablet 0   Probiotic Product (PROBIOTIC PO) Take 1 capsule by mouth daily.     prochlorperazine (COMPAZINE) 10 MG tablet Take 10 mg by mouth every 6 (six) hours as needed (Nausea or vomiting). 30 tablet 1   promethazine (PHENERGAN) 25 MG tablet Take 1 tablet (25 mg total) by mouth every 8 (eight) hours as needed for up to 15 doses for nausea or vomiting. 15 tablet 0   spironolactone (ALDACTONE) 25 MG tablet Take 12.5 mg by mouth daily.     sucralfate (CARAFATE) 1 g tablet Take 1 tablet (1 g total) by mouth 4 (four) times daily -  with meals and at bedtime. 120 tablet 2   No current facility-administered medications for this visit.    Allergies as of 08/17/2022 - Review Complete 08/17/2022  Allergen Reaction Noted   Benazepril Anaphylaxis, Swelling, and Hives 09/15/2016   Ondansetron hcl  Hives 07/05/2017   Codeine Nausea And Vomiting 02/21/2015   Morphine Hives 07/05/2017    Family History  Problem Relation Age of Onset   Diabetes Mother    Hypertension Mother    Heart disease Mother        s/p 1 stent   Hyperlipidemia Mother    Arthritis Mother    Cancer Father        COLON   Colon cancer Father 82   Irritable bowel syndrome Sister    Hyperlipidemia Daughter    Hypertension Daughter    Hypertension Maternal Grandmother    Arthritis Maternal Grandmother    Heart disease Maternal Grandfather        MI   Hypertension Maternal Grandfather    Arthritis Maternal Grandfather    Hypertension Son    Esophageal cancer Neg Hx     Social History   Socioeconomic History   Marital status: Divorced    Spouse name: [name]   Number of children: 3   Years of education: [name]   Highest education level: High school graduate  Occupational History   [name]  Tobacco Use   Smoking status: Never   Smokeless tobacco: Never  Vaping Use   Vaping Use: Never used  Substance and Sexual Activity   Alcohol use: No   Drug use: No   Sexual activity: [name]  Other Topics Concern   [name]  Social History Narrative   Lives at home alone   Retired   Caffeine: coffee   Social Determinants of Health   Financial Resource Strain: Low Risk  (10/17/2021)   Overall Financial Resource Strain (CARDIA)    Difficulty of Paying Living Expenses: Not hard at all  Food Insecurity: No Food Insecurity (10/17/2021)   Hunger Vital Sign    Worried About Running Out of Food in the Last Year: Never true    Ran Out of Food in the Last Year: Never true  Transportation Needs: No Transportation Needs (10/17/2021)   PRAPARE - Transportation    Lack  of Transportation (Medical): No    Lack of Transportation (Non-Medical): No  Physical Activity: Insufficiently Active (10/17/2021)   Exercise Vital Sign    Days of Exercise per Week: 2 days    Minutes of Exercise per Session:  30 min  Stress: No Stress Concern Present (10/17/2021)   [place]    Feeling of Stress : Not at all  Social Connections: Moderately Isolated (10/17/2021)   Social Connection and Isolation Panel [NHANES]    Frequency of Communication with Friends and Family: More than three times a week    Frequency of Social Gatherings with Friends and Family: More than three times a week    Attends Religious Services: More than 4 times per year    Active Member of [employer] or Organizations: No    Attends Archivist Meetings: Never    Marital Status: Divorced  Human resources officer Violence: Not At Risk (10/17/2021)   Humiliation, Afraid, Rape, and Kick questionnaire    Fear of Current or Ex-Partner: No    Emotionally Abused: No    Physically Abused: No    Sexually Abused: No    Review of Systems:    Constitutional: No weight loss, fever or chills Cardiovascular: No chest pain Respiratory: No SOB  Gastrointestinal: See HPI and otherwise negative   Physical Exam:  Vital signs: BP (!) 140/82   Pulse 94   Ht _0  (1.651 m)   Wt 192 lb (87.1 kg)   BMI 31.95 kg/m    Constitutional:   Pleasant AA female appears to be in NAD, Well developed, Well nourished, alert and cooperative Respiratory: Respirations even and unlabored. Lungs clear to auscultation bilaterally.   No wheezes, crackles, or rhonchi.  Cardiovascular: Normal S1, S2. No MRG. Regular rate and rhythm. No peripheral edema, cyanosis or pallor.  Gastrointestinal:  Soft, nondistended, nontender. No rebound or guarding. Normal bowel sounds. No appreciable masses or hepatomegaly. Rectal:  Not performed.  Psychiatric: Oriented to person, place and time. Demonstrates good judgement and reason without abnormal affect or behaviors.  RELEVANT LABS AND IMAGING: CBC    Component Value Date/Time   WBC 2.2 (L) 08/14/2022 1637   RBC 3.96 08/14/2022 1637   HGB 11.5 (L) 08/14/2022  1637   HGB 11.7 (L) 08/03/2022 0936   HGB 11.1 (L) 11/08/2017 1127   HCT 34.7 (L) 08/14/2022 1637   HCT 34.1 (L) 11/08/2017 1127   PLT 102 (L) 08/14/2022 1637   PLT 136 (L) 08/03/2022 0936   PLT 145 11/08/2017 1127   MCV 87.6 08/14/2022 1637   MCV 88 11/08/2017 1127   MCH 29.0 08/14/2022 1637   MCHC 33.1 08/14/2022 1637   RDW 13.5 08/14/2022 1637   RDW 14.6 11/08/2017 1127   LYMPHSABS 0.9 08/14/2022 1637   LYMPHSABS 0.9 11/08/2017 1127   MONOABS 0.3 08/14/2022 1637   EOSABS 0.1 08/14/2022 1637   EOSABS 0.1 11/08/2017 1127   BASOSABS 0.0 08/14/2022 1637   BASOSABS 0.0 11/08/2017 1127    CMP     Component Value Date/Time   NA 138 08/14/2022 1637   NA 143 11/08/2017 1127   NA 140 12/31/2016 1000   K 3.7 08/14/2022 1637   K 4.0 11/08/2017 1127   K 3.6 12/31/2016 1000   CL 107 08/14/2022 1637   CL 107 11/08/2017 1127   CO2 25 08/14/2022 1637   CO2 25 11/08/2017 1127   CO2 24 12/31/2016 1000   GLUCOSE 137 (H) 08/14/2022 1637  GLUCOSE 96 11/08/2017 1127   BUN 14 08/14/2022 1637   BUN 19 11/08/2017 1127   BUN 22.3 12/31/2016 1000   CREATININE 0.58 08/14/2022 1637   CREATININE 0.67 08/03/2022 0936   CREATININE 0.9 11/08/2017 1127   CREATININE 0.8 12/31/2016 1000   CALCIUM 8.7 (L) 08/14/2022 1637   CALCIUM 9.9 11/08/2017 1127   CALCIUM 9.5 12/31/2016 1000   PROT 8.4 (H) 08/14/2022 1637   PROT 9.4 (H) 11/08/2017 1127   PROT 10.3 (H) 11/08/2017 1127   PROT 9.5 (H) 12/31/2016 1000   ALBUMIN 3.2 (L) 08/14/2022 1637   ALBUMIN 3.2 (L) 11/08/2017 1127   ALBUMIN 3.7 12/31/2016 1000   AST 26 08/14/2022 1637   AST 31 08/03/2022 0936   AST 28 12/31/2016 1000   ALT 15 08/14/2022 1637   ALT 16 08/03/2022 0936   ALT 36 11/08/2017 1127   ALT 16 12/31/2016 1000   ALKPHOS 48 08/14/2022 1637   ALKPHOS 44 11/08/2017 1127   ALKPHOS 51 12/31/2016 1000   BILITOT 0.2 (L) 08/14/2022 1637   BILITOT 0.5 08/03/2022 0936   BILITOT 0.46 12/31/2016 1000   GFRNONAA >60 08/14/2022 1637    GFRNONAA >60 08/03/2022 0936   GFRNONAA 82 09/18/2016 1152   GFRAA >60 08/10/2020 1119   GFRAA >60 07/26/2020 1352   GFRAA >89 09/18/2016 1152    Assessment: 1.  Epigastric pain: Ongoing, initially with nausea and vomiting thought possibly functional with negative previous extensive GI work-up and negative GES in February 2023, no help from Pantoprazole 40 twice daily 2.  IBS-D: Ongoing diarrhea 5-6 times a day regardless of Lomotil twice daily, Loperamide 4 times a day and Colestid 2 g twice daily; uncertain etiology 3.  Multiple myeloma  Plan: 1.  Patient is already scheduled for EGD and colonoscopy 10/24 with Dr. Lyndel Safe at the hospital.  She has received cardiac clearance.  She was talked through the bowel prep and instructions today at her visit. 2.  Continue current medications.  Explained to the patient that there is not a lot to add to her at the moment as she is on pretty much everything or has tried pretty much everything in the past.  Did discuss that I would not recommend a high-dose of ibuprofen going forward even though she felt this was somewhat helpful as it could add to her GI issues. 3.  Patient to follow with Dr. Lyndel Safe per recommendations after time of procedures above.  Ellouise Newer, PA-C [facility] 08/17/2022, 1:26 PM  Cc: Mosie Lukes, MD

## 2022-08-17 NOTE — Patient Instructions (Signed)
_______________________________________________________  If you are age 68 or older, your body mass index should be between 23-30. Your Body mass index is 31.95 kg/m. If this is out of the aforementioned range listed, please consider follow up with your Primary Care Provider.  If you are age 29 or younger, your body mass index should be between 19-25. Your Body mass index is 31.95 kg/m. If this is out of the aformentioned range listed, please consider follow up with your Primary Care Provider.   ________________________________________________________  The [facility] providers would like to encourage you to use [facility] to communicate with providers for non-urgent requests or questions.  Due to long hold times on the telephone, sending your provider a message by [facility] may be a faster and more efficient way to get a response.  Please allow 48 business hours for a response.  Please remember that this is for non-urgent requests.  _______________________________________________________   Thank you for choosing me and [facility].  Jennnifer Alcova PA

## 2022-08-18 ENCOUNTER — Ambulatory Visit: Payer: Medicare Other | Admitting: Family

## 2022-08-18 ENCOUNTER — Other Ambulatory Visit: Payer: Self-pay

## 2022-08-18 MED ORDER — AMOXICILLIN 500 MG PO TABS: 500.0000 mg | ORAL_TABLET | Freq: Two times a day (BID) | ORAL | 0 refills | Status: DC

## 2022-08-18 NOTE — Telephone Encounter (Signed)
Patient called stating she is requesting an abx because of her stomach, that Dr.Ennever usually sends her something if needed. Discussed with Dr.Ennever who approved rx for amoxicillin, order placed patient called back and informed.

## 2022-08-18 NOTE — Telephone Encounter (Signed)
Dr. Adrian Prows office was contacted to check on the status of the cardiac clearance: Office stated that they would given Dr Ola Spurr nurse the message. Direct number provided:

## 2022-08-19 NOTE — Telephone Encounter (Signed)
Spoke directly to Dr. Ola Spurr nurse Marc Morgans. Lonnie RN stated that there office has not seen a Cardiac Clearance request for this pt:  Paper fax sent to there office [phone]: Requesting Cardiac Clearance

## 2022-08-20 ENCOUNTER — Inpatient Hospital Stay: Payer: Medicare Other

## 2022-08-20 VITALS — BP 155/88 | HR 80 | Temp 97.8°F | Resp 17

## 2022-08-20 DIAGNOSIS — C9000 Multiple myeloma not having achieved remission: Secondary | ICD-10-CM | POA: Diagnosis not present

## 2022-08-20 DIAGNOSIS — R197 Diarrhea, unspecified: Secondary | ICD-10-CM | POA: Diagnosis not present

## 2022-08-20 MED ORDER — SODIUM CHLORIDE 0.9 % IV SOLN: Freq: Once | INTRAVENOUS | Status: AC

## 2022-08-20 NOTE — Patient Instructions (Signed)
Dehydration, Adult Dehydration is condition in which there is not enough water or other fluids in the body. This happens when a person loses more fluids than he or she takes in. Important body parts cannot work right without the right amount of fluids. Any loss of fluids from the body can cause dehydration. Dehydration can be mild, worse, or very bad. It should be treated right away to keep it from getting very bad. What are the causes? This condition may be caused by: Conditions that cause loss of water or other fluids, such as: Watery poop (diarrhea). Vomiting. Sweating a lot. Peeing (urinating) a lot. Not drinking enough fluids, especially when you: Are ill. Are doing things that take a lot of energy to do. Other illnesses and conditions, such as fever or infection. Certain medicines, such as medicines that take extra fluid out of the body (diuretics). Lack of safe drinking water. Not being able to get enough water and food. What increases the risk? The following factors may make you more likely to develop this condition: Having a long-term (chronic) illness that has not been treated the right way, such as: Diabetes. Heart disease. Kidney disease. Being 65 years of age or older. Having a disability. Living in a place that is high above the ground or sea (high in altitude). The thinner, dried air causes more fluid loss. Doing exercises that put stress on your body for a long time. What are the signs or symptoms? Symptoms of dehydration depend on how bad it is. Mild or worse dehydration Thirst. Dry lips or dry mouth. Feeling dizzy or light-headed, especially when you stand up from sitting. Muscle cramps. Your body making: Dark pee (urine). Pee may be the color of tea. Less pee than normal. Less tears than normal. Headache. Very bad dehydration Changes in skin. Skin may: Be cold to the touch (clammy). Be blotchy or pale. Not go back to normal right after you lightly pinch  it and let it go. Little or no tears, pee, or sweat. Changes in vital signs, such as: Fast breathing. Low blood pressure. Weak pulse. Pulse that is more than 100 beats a minute when you are sitting still. Other changes, such as: Feeling very thirsty. Eyes that look hollow (sunken). Cold hands and feet. Being mixed up (confused). Being very tired (lethargic) or having trouble waking from sleep. Short-term weight loss. Loss of consciousness. How is this treated? Treatment for this condition depends on how bad it is. Treatment should start right away. Do not wait until your condition gets very bad. Very bad dehydration is an emergency. You will need to go to a hospital. Mild or worse dehydration can be treated at home. You may be asked to: Drink more fluids. Drink an oral rehydration solution (ORS). This drink helps get the right amounts of fluids and salts and minerals in the blood (electrolytes). Very bad dehydration can be treated: With fluids through an IV tube. By getting normal levels of salts and minerals in your blood. This is often done by giving salts and minerals through a tube. The tube is passed through your nose and into your stomach. By treating the root cause. Follow these instructions at home: Oral rehydration solution If told by your doctor, drink an ORS: Make an ORS. Use instructions on the package. Start by drinking small amounts, about  cup (120 mL) every 5-10 minutes. Slowly drink more until you have had the amount that your doctor said to have. Eating and drinking          Drink enough clear fluid to keep your pee pale yellow. If you were told to drink an ORS, finish the ORS first. Then, start slowly drinking other clear fluids. Drink fluids such as: Water. Do not drink only water. Doing that can make the salt (sodium) level in your body get too low. Water from ice chips you suck on. Fruit juice that you have added water to (diluted). Low-calorie sports  drinks. Eat foods that have the right amounts of salts and minerals, such as: Bananas. Oranges. Potatoes. Tomatoes. Spinach. Do not drink alcohol. Avoid: Drinks that have a lot of sugar. These include: High-calorie sports drinks. Fruit juice that you did not add water to. Soda. Caffeine. Foods that are greasy or have a lot of fat or sugar. General instructions Take over-the-counter and prescription medicines only as told by your doctor. Do not take salt tablets. Doing that can make the salt level in your body get too high. Return to your normal activities as told by your doctor. Ask your doctor what activities are safe for you. Keep all follow-up visits as told by your doctor. This is important. Contact a doctor if: You have pain in your belly (abdomen) and the pain: Gets worse. Stays in one place. You have a rash. You have a stiff neck. You get angry or annoyed (irritable) more easily than normal. You are more tired or have a harder time waking than normal. You feel: Weak or dizzy. Very thirsty. Get help right away if you have: Any symptoms of very bad dehydration. Symptoms of vomiting, such as: You cannot eat or drink without vomiting. Your vomiting gets worse or does not go away. Your vomit has blood or green stuff in it. Symptoms that get worse with treatment. A fever. A very bad headache. Problems with peeing or pooping (having a bowel movement), such as: Watery poop that gets worse or does not go away. Blood in your poop (stool). This may cause poop to look black and tarry. Not peeing in 6-8 hours. Peeing only a small amount of very dark pee in 6-8 hours. Trouble breathing. These symptoms may be an emergency. Do not wait to see if the symptoms will go away. Get medical help right away. Call your local emergency services (911 in the [place] Do not drive yourself to the hospital. Summary Dehydration is a condition in which there is not enough water or other fluids  in the body. This happens when a person loses more fluids than he or she takes in. Treatment for this condition depends on how bad it is. Treatment should be started right away. Do not wait until your condition gets very bad. Drink enough clear fluid to keep your pee pale yellow. If you were told to drink an oral rehydration solution (ORS), finish the ORS first. Then, start slowly drinking other clear fluids. Take over-the-counter and prescription medicines only as told by your doctor. Get help right away if you have any symptoms of very bad dehydration. This information is not intended to replace advice given to you by your health care provider. Make sure you discuss any questions you have with your health care provider. Document Revised: 03/04/2022 Document Reviewed: 06/08/2019 Elsevier Patient Education  2023 [employer]  

## 2022-08-23 DIAGNOSIS — H6690 Otitis media, unspecified, unspecified ear: Secondary | ICD-10-CM | POA: Diagnosis not present

## 2022-08-23 DIAGNOSIS — R030 Elevated blood-pressure reading, without diagnosis of hypertension: Secondary | ICD-10-CM | POA: Diagnosis not present

## 2022-08-23 DIAGNOSIS — H9203 Otalgia, bilateral: Secondary | ICD-10-CM | POA: Diagnosis not present

## 2022-08-24 ENCOUNTER — Encounter (HOSPITAL_COMMUNITY): Payer: Self-pay | Admitting: Gastroenterology

## 2022-08-25 ENCOUNTER — Ambulatory Visit (INDEPENDENT_AMBULATORY_CARE_PROVIDER_SITE_OTHER): Payer: Medicare Other | Admitting: Family Medicine

## 2022-08-25 VITALS — BP 134/82 | HR 84 | Temp 98.0°F | Resp 16 | Ht 66.0 in | Wt 193.2 lb

## 2022-08-25 DIAGNOSIS — R350 Frequency of micturition: Secondary | ICD-10-CM

## 2022-08-25 DIAGNOSIS — R197 Diarrhea, unspecified: Secondary | ICD-10-CM

## 2022-08-25 DIAGNOSIS — R739 Hyperglycemia, unspecified: Secondary | ICD-10-CM | POA: Diagnosis not present

## 2022-08-25 DIAGNOSIS — E860 Dehydration: Secondary | ICD-10-CM | POA: Diagnosis not present

## 2022-08-25 DIAGNOSIS — Z23 Encounter for immunization: Secondary | ICD-10-CM | POA: Diagnosis not present

## 2022-08-25 MED ORDER — OXYCODONE-ACETAMINOPHEN 5-325 MG PO TABS: 1.0000 | ORAL_TABLET | Freq: Four times a day (QID) | ORAL | 0 refills | Status: DC | PRN

## 2022-08-25 NOTE — Progress Notes (Signed)
Subjective:   By signing my name below, I, Tami [name], attest that this documentation has been prepared under the direction and in the presence of Mosie Lukes, MD., 08/25/2022.     [name], female    DOB: 10-06-1956, 66 y.o.   MRN: [id number]  No chief complaint on file.  HPI Patient is in today for an office visit. Her daughter-in-law is with her today and also speaking on her behalf.  Diet: She has been unable to maintain a balanced diet due to her epigastric pain.  Epigastric Pain: She reports that she is experiencing epigastric pain and will undergo an upper and lower endoscopy on 09/01/2022 with Dr. Jackquline Denmark. She further reports that the pain is constant and elevates to what she describes as 7/10. Her oncologist, Dr. Burney Gauze, prescribed Oxycodone 5-325 mg to help manage the pain. She states that she has been experiencing irregular bowel movements and had one bowel movement today. She further states that she has been experiencing constant diarrhea and has taken Lomotil and Miralax to manage this.  Immunizations: She has been informed about receiving COVID-19, Pneumonia, RSV, and Shingles immunizations. She will receive the high-dose Flu immunization today.  Fever: She states that she feels feverish during today's visit.   Refills: She is requesting a refill on Oxycodone 5-325 mg.  Past Medical History:  Diagnosis Date   Abnormal SPEP 07/26/2016   Arthritis    knees, hands   Back pain 07/14/2016   Bowel obstruction ([place]) 11/2014   C. 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   Multiple myeloma ([place]) 07/20/2018   Multiple myeloma not having achieved remission ([place]) 07/20/2018   Myalgia 12/31/2016   Obstructive sleep apnea 03/31/2015   SOB (shortness of breath) 04/09/2016   Stroke ([place])    TIAs   UTI (urinary tract infection)    Past Surgical History:  Procedure Laterality Date   ABDOMINAL HYSTERECTOMY     menorraghia, 2006, total   CHOLECYSTECTOMY     COLONOSCOPY  2018   [facility] per patient   ESOPHAGOGASTRODUODENOSCOPY  2018   [facility]    KNEE SURGERY     right, repair torn torn cartialage   PACEMAKER INSERTION  08/2014   TONSILLECTOMY     TUBAL LIGATION     Family History  Problem Relation Age of Onset   Diabetes Mother    Hypertension Mother    Heart disease Mother        s/p 1 stent   Hyperlipidemia Mother    Arthritis Mother    Cancer Father        COLON   Colon cancer Father 32   Irritable bowel syndrome Sister    Hyperlipidemia Daughter    Hypertension Daughter    Hypertension Maternal Grandmother    Arthritis Maternal Grandmother    Heart disease Maternal Grandfather        MI   Hypertension Maternal Grandfather    Arthritis Maternal Grandfather  Hypertension Son    Esophageal cancer Neg Hx    Social History   Socioeconomic History   Marital status: Divorced    Spouse name: [name]   Number of children: 3   Years of education: [name]   Highest education level: High school graduate  Occupational History   [name]  Tobacco Use   Smoking status: Never   Smokeless tobacco: Never  Vaping Use   Vaping Use: Never used  Substance and Sexual Activity   Alcohol use: No   Drug use: No   Sexual activity: [name]  Other Topics Concern   [name]  Social History Narrative   Lives at home alone   Retired   Caffeine: coffee   Social Determinants of Health   Financial Resource Strain: Low Risk   (10/17/2021)   Overall Financial Resource Strain (CARDIA)    Difficulty of Paying Living Expenses: Not hard at all  Food Insecurity: No Food Insecurity (10/17/2021)   Hunger Vital Sign    Worried About Running Out of Food in the Last Year: Never true    [place] in the Last Year: Never true  Transportation Needs: No Transportation Needs (10/17/2021)   PRAPARE - Hydrologist (Medical): No    Lack of Transportation (Non-Medical): No  Physical Activity: Insufficiently Active (10/17/2021)   Exercise Vital Sign    Days of Exercise per Week: 2 days    Minutes of Exercise per Session: 30 min  Stress: No Stress Concern Present (10/17/2021)   [place]    Feeling of Stress : Not at all  Social Connections: Moderately Isolated (10/17/2021)   Social Connection and Isolation Panel [NHANES]    Frequency of Communication with Friends and Family: More than three times a week    Frequency of Social Gatherings with Friends and Family: More than three times a week    Attends Religious Services: More than 4 times per year    Active Member of [employer] or Organizations: No    Attends Archivist Meetings: Never    Marital Status: Divorced  Human resources officer Violence: Not At Risk (10/17/2021)   Humiliation, Afraid, Rape, and Kick questionnaire    Fear of Current or Ex-Partner: No    Emotionally Abused: No    Physically Abused: No    Sexually Abused: No   Outpatient Medications Prior to Visit  Medication Sig Dispense Refill   acetaminophen (TYLENOL) 500 MG tablet Take 500 mg by mouth in the morning.     albuterol (VENTOLIN HFA) 108 (90 Base) MCG/ACT inhaler Inhale 2 puffs into the lungs every 6 (six) hours as needed for wheezing or shortness of breath. 18 g 5   amoxicillin (AMOXIL) 500 MG tablet Take 1 tablet (500 mg total) by mouth 2 (two) times daily. 14 tablet 0   aspirin EC 81 MG tablet Take 1  tablet (81 mg total) by mouth daily. 90 tablet 1   carvedilol (COREG) 25 MG tablet Take 1 tablet (25 mg total) by mouth 2 (two) times daily with a meal. 180 tablet 1   cetirizine (ZYRTEC) 10 MG tablet Take 10 mg by mouth daily as needed for allergies.     Cholecalciferol (VITAMIN D-3) 25 MCG (1000 UT) CAPS Take 1 capsule (1,000 Units total) by mouth daily. 60 capsule 2   colestipol (COLESTID) 1 g tablet Take 2 tablets (2 g  total) by mouth every 12 (twelve) hours. 120 tablet 1   diphenoxylate-atropine (LOMOTIL) 2.5-0.025 MG tablet Take 1 tablet by mouth 4 (four) times daily as needed for diarrhea or loose stools. 100 tablet 0   fluticasone (FLONASE) 50 MCG/ACT nasal spray Place 2 sprays into both nostrils daily. (Patient taking differently: Place 2 sprays into both nostrils every evening.) 16 g 0   furosemide (LASIX) 20 MG tablet Take 1 tablet by mouth daily as needed for fluid.     hyoscyamine (LEVSIN SL) 0.125 MG SL tablet Place 1 tablet (0.125 mg total) under the tongue every 4 (four) hours as needed. 30 tablet 1   ibuprofen (ADVIL) 800 MG tablet Take 1 tablet (800 mg total) by mouth 3 (three) times daily. (Patient taking differently: Take 800 mg by mouth every 8 (eight) hours as needed for moderate pain.) 21 tablet 0   loperamide (IMODIUM) 2 MG capsule Take 1 capsule (2 mg total) by mouth 4 (four) times daily as needed for diarrhea or loose stools. 12 capsule 0   loratadine (CLARITIN) 10 MG tablet Take 1 tablet (10 mg total) by mouth daily. (Patient taking differently: Take 10 mg by mouth daily as needed for allergies.) 30 tablet 11   losartan (COZAAR) 50 MG tablet Take 50 mg by mouth daily.     magnesium oxide (MAG-OX) 400 MG tablet Take 1 tablet (400 mg total) by mouth 2 (two) times daily. Hold if you have diarrhea 60 tablet 1   Multiple Vitamins-Minerals (MULTIVITAMIN ADULT PO) Take 1 tablet by mouth daily.     oxyCODONE-acetaminophen (PERCOCET/ROXICET) 5-325 MG tablet Take 1 tablet by mouth  every 6 (six) hours as needed for severe pain.     pantoprazole (PROTONIX) 40 MG tablet Take 1 tablet (40 mg total) by mouth 2 (two) times daily. 180 tablet 4   potassium chloride SA (KLOR-CON M) 20 MEQ tablet TAKE 1 TABLET BY MOUTH EVERY MONDAY, WEDNESDAY, AND FRIDAY. FOLLOW UP WITH MDTO MONITOR POTASSIUM LEVEL 30 tablet 0   Probiotic Product (PROBIOTIC PO) Take 1 capsule by mouth daily.     prochlorperazine (COMPAZINE) 10 MG tablet Take 10 mg by mouth every 6 (six) hours as needed (Nausea or vomiting). 30 tablet 1   promethazine (PHENERGAN) 25 MG tablet Take 1 tablet (25 mg total) by mouth every 8 (eight) hours as needed for up to 15 doses for nausea or vomiting. 15 tablet 0   spironolactone (ALDACTONE) 25 MG tablet Take 12.5 mg by mouth daily.     sucralfate (CARAFATE) 1 g tablet Take 1 tablet (1 g total) by mouth 4 (four) times daily -  with meals and at bedtime. 120 tablet 2   No facility-administered medications prior to visit.   Allergies  Allergen Reactions   Benazepril Anaphylaxis, Swelling and Hives    angioedema Throat and lip swelling   Ondansetron Hcl Hives    Redness and hives post IV admin on 07/05/17   Codeine Nausea And Vomiting   Morphine Hives    Redness and hives noted post IV admin on 07/05/17   Review of Systems  Gastrointestinal:  Positive for diarrhea.       (+) Epigastric pain.      Objective:    Physical Exam Constitutional:      General: She is not in acute distress.    Appearance: Normal appearance. She is not ill-appearing.  HENT:     Head: Normocephalic and atraumatic.     Right Ear: External ear normal.  Left Ear: External ear normal.     Mouth/Throat:     Mouth: Mucous membranes are dry.     Pharynx: Oropharynx is clear.  Eyes:     Extraocular Movements: Extraocular movements intact.     Pupils: Pupils are equal, round, and reactive to light.  Cardiovascular:     Rate and Rhythm: Normal rate and regular rhythm.     Pulses: Normal pulses.      Heart sounds: Normal heart sounds. No murmur heard.    No gallop.  Pulmonary:     Effort: Pulmonary effort is normal. No respiratory distress.     Breath sounds: Normal breath sounds. No wheezing or rales.  Abdominal:     General: Bowel sounds are normal.  Skin:    General: Skin is warm and dry.  Neurological:     Mental Status: She is alert and oriented to person, place, and time.  Psychiatric:        Mood and Affect: Mood normal.        Behavior: Behavior normal.        Judgment: Judgment normal.    There were no vitals taken for this visit. Wt Readings from Last 3 Encounters:  08/17/22 192 lb (87.1 kg)  08/14/22 195 lb (88.5 kg)  08/03/22 195 lb (88.5 kg)   Diabetic Foot Exam - Simple   No data filed    Lab Results  Component Value Date   WBC 2.2 (L) 08/14/2022   HGB 11.5 (L) 08/14/2022   HCT 34.7 (L) 08/14/2022   PLT 102 (L) 08/14/2022   GLUCOSE 137 (H) 08/14/2022   CHOL 171 04/21/2022   TRIG 105.0 04/21/2022   HDL 49.40 04/21/2022   LDLCALC 100 (H) 04/21/2022   ALT 15 08/14/2022   AST 26 08/14/2022   NA 138 08/14/2022   K 3.7 08/14/2022   CL 107 08/14/2022   CREATININE 0.58 08/14/2022   BUN 14 08/14/2022   CO2 25 08/14/2022   TSH 1.97 04/21/2022   INR 0.90 07/28/2018   HGBA1C 5.8 04/21/2022   Lab Results  Component Value Date   TSH 1.97 04/21/2022   Lab Results  Component Value Date   WBC 2.2 (L) 08/14/2022   HGB 11.5 (L) 08/14/2022   HCT 34.7 (L) 08/14/2022   MCV 87.6 08/14/2022   PLT 102 (L) 08/14/2022   Lab Results  Component Value Date   NA 138 08/14/2022   K 3.7 08/14/2022   CHLORIDE 108 12/31/2016   CO2 25 08/14/2022   GLUCOSE 137 (H) 08/14/2022   BUN 14 08/14/2022   CREATININE 0.58 08/14/2022   BILITOT 0.2 (L) 08/14/2022   ALKPHOS 48 08/14/2022   AST 26 08/14/2022   ALT 15 08/14/2022   PROT 8.4 (H) 08/14/2022   ALBUMIN 3.2 (L) 08/14/2022   CALCIUM 8.7 (L) 08/14/2022   ANIONGAP 6 08/14/2022   EGFR >90 12/31/2016   GFR  89.77 05/21/2022   Lab Results  Component Value Date   CHOL 171 04/21/2022   Lab Results  Component Value Date   HDL 49.40 04/21/2022   Lab Results  Component Value Date   LDLCALC 100 (H) 04/21/2022   Lab Results  Component Value Date   TRIG 105.0 04/21/2022   Lab Results  Component Value Date   CHOLHDL 3 04/21/2022   Lab Results  Component Value Date   HGBA1C 5.8 04/21/2022      Assessment & Plan:   Problem List Items Addressed This Visit   None  No orders of the defined types were placed in this encounter.  I, Tami [name], personally preformed the services described in this documentation.  All medical record entries made by the scribe were at my direction and in my presence.  I have reviewed the chart and discharge instructions (if applicable) and agree that the record reflects my personal performance and is accurate and complete. 08/25/2022  I,Mohammed Iqbal,acting as a scribe for Penni Homans, MD.,have documented all relevant documentation on the behalf of Penni Homans, MD,as directed by  Penni Homans, MD while in the presence of Penni Homans, MD.  Tami [name]

## 2022-08-25 NOTE — Patient Instructions (Signed)
Flu shot today 2 weeks between following shots Covid Arexvy (RSV/Respiratory Syncitial virus) Shingrix #1 Prevnar 20 Shingrix #2 is 2 to 6 months after first Shingrix

## 2022-08-25 NOTE — Assessment & Plan Note (Signed)
Supplement and monitor 

## 2022-08-25 NOTE — Progress Notes (Signed)
Attempted to obtain medical history via telephone, unable to reach at this time. HIPAA compliant voicemail message left requesting return call to pre surgical testing department. 

## 2022-08-26 ENCOUNTER — Telehealth: Payer: Self-pay

## 2022-08-26 ENCOUNTER — Encounter (HOSPITAL_COMMUNITY): Payer: Self-pay | Admitting: Gastroenterology

## 2022-08-26 ENCOUNTER — Telehealth: Payer: Self-pay | Admitting: Physician Assistant

## 2022-08-26 LAB — URINALYSIS
Bilirubin Urine: NEGATIVE
Ketones, ur: NEGATIVE
Leukocytes,Ua: NEGATIVE
Nitrite: NEGATIVE
Specific Gravity, Urine: 1.02 (ref 1.000–1.030)
Total Protein, Urine: NEGATIVE
Urine Glucose: NEGATIVE
Urobilinogen, UA: 0.2 (ref 0.0–1.0)
pH: 6 (ref 5.0–8.0)

## 2022-08-26 LAB — COMPREHENSIVE METABOLIC PANEL
ALT: 11 U/L (ref 0–35)
AST: 24 U/L (ref 0–37)
Albumin: 3.9 g/dL (ref 3.5–5.2)
Alkaline Phosphatase: 50 U/L (ref 39–117)
BUN: 14 mg/dL (ref 6–23)
CO2: 28 mEq/L (ref 19–32)
Calcium: 9.4 mg/dL (ref 8.4–10.5)
Chloride: 103 mEq/L (ref 96–112)
Creatinine, Ser: 0.55 mg/dL (ref 0.40–1.20)
GFR: 94.3 mL/min (ref >60.00)
Glucose, Bld: 97 mg/dL (ref 70–99)
Potassium: 4 mEq/L (ref 3.5–5.1)
Sodium: 139 mEq/L (ref 135–145)
Total Bilirubin: 0.4 mg/dL (ref 0.2–1.2)
Total Protein: 8.9 g/dL — ABNORMAL HIGH (ref 6.0–8.3)

## 2022-08-26 NOTE — Assessment & Plan Note (Signed)
Patient still struggling due to diarrhea. Emphasized need to intake po electrolytes and if she does not feel like eating she should emphasize protein and fluids

## 2022-08-26 NOTE — Telephone Encounter (Signed)
Inbound call from patient stating that she is scheduled to have a colonoscopy and endoscopy with Dr. Lyndel Safe on 10/24. Patient stated that the hospital called and advised her that she would need to call to discuss with [place] about her issues with diarrhea. Patient stated she is having issues with diarrhea and is getting dehydrated. Patient is requesting a call back to discuss she can just take the Miralax and not the 4 dulcolax pills. Please advise.

## 2022-08-26 NOTE — Assessment & Plan Note (Signed)
hgba1c acceptable, minimize simple carbs. Increase exercise as tolerated.  

## 2022-08-26 NOTE — Telephone Encounter (Signed)
Cardiac Clearance received from Dr. Adrian Prows office for EGD and Colonoscopy: Copies were made and 1 copy placed on a Dr. Lyndel Safe desk and another copy sent to be scanned into Epic:

## 2022-08-26 NOTE — Assessment & Plan Note (Signed)
Supplement and monitor 

## 2022-08-26 NOTE — Telephone Encounter (Signed)
Dr. Gupta please advise

## 2022-08-26 NOTE — Assessment & Plan Note (Signed)
Check UA and culture 

## 2022-08-26 NOTE — Assessment & Plan Note (Addendum)
She is preparing to have an in house colonoscopy and upper endoscopy with gastroenterology next week. Unfortunately continues to have 2-4 loose stool but no bloody or tarry stool. May continue to use Lomotil prn this week but should avoid use next week prior to her procedure. Sadly she has had Cdiff numerous times in past so she knows how it presents and how it smells and has not noted this so far.

## 2022-08-27 LAB — URINE CULTURE
MICRO NUMBER:: 14061820
SPECIMEN QUALITY:: ADEQUATE

## 2022-08-28 ENCOUNTER — Other Ambulatory Visit: Payer: Self-pay

## 2022-08-28 ENCOUNTER — Other Ambulatory Visit: Payer: Medicare Other

## 2022-08-28 ENCOUNTER — Inpatient Hospital Stay: Payer: Medicare Other

## 2022-08-28 ENCOUNTER — Other Ambulatory Visit: Payer: Self-pay | Admitting: Hematology & Oncology

## 2022-08-28 VITALS — BP 169/81 | HR 75 | Temp 98.0°F | Resp 17 | Ht 66.0 in | Wt 190.0 lb

## 2022-08-28 DIAGNOSIS — R197 Diarrhea, unspecified: Secondary | ICD-10-CM

## 2022-08-28 DIAGNOSIS — C9000 Multiple myeloma not having achieved remission: Secondary | ICD-10-CM | POA: Diagnosis not present

## 2022-08-28 MED ORDER — SODIUM CHLORIDE 0.9 % IV SOLN: Freq: Once | INTRAVENOUS | Status: AC

## 2022-08-28 MED ORDER — SODIUM CHLORIDE 0.9 % IV SOLN: Freq: Once | INTRAVENOUS | Status: DC

## 2022-08-28 NOTE — Patient Instructions (Signed)
Dehydration, Adult Dehydration is a condition in which there is not enough water or other fluids in the body. This happens when a person loses more fluids than he or she takes in. Important organs, such as the kidneys, brain, and heart, cannot function without a proper amount of fluids. Any loss of fluids from the body can lead to dehydration. Dehydration can be mild, moderate, or severe. It should be treated right away to prevent it from becoming severe. What are the causes? Dehydration may be caused by: Conditions that cause loss of water or other fluids, such as diarrhea, vomiting, or sweating or urinating a lot. Not drinking enough fluids, especially when you are ill or doing activities that require a lot of energy. Other illnesses and conditions, such as fever or infection. Certain medicines, such as medicines that remove excess fluid from the body (diuretics). Lack of safe drinking water. Not being able to get enough water and food. What increases the risk? The following factors may make you more likely to develop this condition: Having a long-term (chronic) illness that has not been treated properly, such as diabetes, heart disease, or kidney disease. Being 65 years of age or older. Having a disability. Living in a place that is high in altitude, where thinner, drier air causes more fluid loss. Doing exercises that put stress on your body for a long time (endurance sports). What are the signs or symptoms? Symptoms of dehydration depend on how severe it is. Mild or moderate dehydration Thirst. Dry lips or dry mouth. Dizziness or light-headedness, especially when standing up from a seated position. Muscle cramps. Dark urine. Urine may be the color of tea. Less urine or tears produced than usual. Headache. Severe dehydration Changes in skin. Your skin may be cold and clammy, blotchy, or pale. Your skin also may not return to normal after being lightly pinched and released. Little or  no tears, urine, or sweat. Changes in vital signs, such as rapid breathing and low blood pressure. Your pulse may be weak or may be faster than 100 beats a minute when you are sitting still. Other changes, such as: Feeling very thirsty. Sunken eyes. Cold hands and feet. Confusion. Being very tired (lethargic) or having trouble waking from sleep. Short-term weight loss. Loss of consciousness. How is this diagnosed? This condition is diagnosed based on your symptoms and a physical exam. You may have blood and urine tests to help confirm the diagnosis. How is this treated? Treatment for this condition depends on how severe it is. Treatment should be started right away. Do not wait until dehydration becomes severe. Severe dehydration is an emergency and needs to be treated in a hospital. Mild or moderate dehydration can be treated at home. You may be asked to: Drink more fluids. Drink an oral rehydration solution (ORS). This drink helps restore proper amounts of fluids and salts and minerals in the blood (electrolytes). Severe dehydration can be treated: With IV fluids. By correcting abnormal levels of electrolytes. This is often done by giving electrolytes through a tube that is passed through your nose and into your stomach (nasogastric tube, or NG tube). By treating the underlying cause of dehydration. Follow these instructions at home: Oral rehydration solution If told by your health care provider, drink an ORS: Make an ORS by following instructions on the package. Start by drinking small amounts, about  cup (120 mL) every 5-10 minutes. Slowly increase how much you drink until you have taken the amount recommended by your health   care provider. Eating and drinking        Drink enough clear fluid to keep your urine pale yellow. If you were told to drink an ORS, finish the ORS first and then start slowly drinking other clear fluids. Drink fluids such as: Water. Do not drink only  water. Doing that can lead to hyponatremia, which is having too little salt (sodium) in the body. Water from ice chips you suck on. Fruit juice that you have added water to (diluted fruit juice). Low-calorie sports drinks. Eat foods that contain a healthy balance of electrolytes, such as bananas, oranges, potatoes, tomatoes, and spinach. Do not drink alcohol. Avoid the following: Drinks that contain a lot of sugar. These include high-calorie sports drinks, fruit juice that is not diluted, and soda. Caffeine. Foods that are greasy or contain a lot of fat or sugar. General instructions Take over-the-counter and prescription medicines only as told by your health care provider. Do not take sodium tablets. Doing that can lead to having too much sodium in the body (hypernatremia). Return to your normal activities as told by your health care provider. Ask your health care provider what activities are safe for you. Keep all follow-up visits as told by your health care provider. This is important. Contact a health care provider if: You have muscle cramps, pain, or discomfort, such as: Pain in your abdomen and the pain gets worse or stays in one area (localizes). Stiff neck. You have a rash. You are more irritable than usual. You are sleepier or have a harder time waking than usual. You feel weak or dizzy. You feel very thirsty. Get help right away if you have: Any symptoms of severe dehydration. Symptoms of vomiting, such as: You cannot eat or drink without vomiting. Vomiting gets worse or does not go away. Vomit includes blood or green matter (bile). Symptoms that get worse with treatment. A fever. A severe headache. Problems with urination or bowel movements, such as: Diarrhea that gets worse or does not go away. Blood in your stool (feces). This may cause stool to look black and tarry. Not urinating, or urinating only a small amount of very dark urine, within 6-8 hours. Trouble  breathing. These symptoms may represent a serious problem that is an emergency. Do not wait to see if the symptoms will go away. Get medical help right away. Call your local emergency services (911 in the [place] Do not drive yourself to the hospital. Summary Dehydration is a condition in which there is not enough water or other fluids in the body. This happens when a person loses more fluids than he or she takes in. Treatment for this condition depends on how severe it is. Treatment should be started right away. Do not wait until dehydration becomes severe. Drink enough clear fluid to keep your urine pale yellow. If you were told to drink an oral rehydration solution (ORS), finish the ORS first and then start slowly drinking other clear fluids. Take over-the-counter and prescription medicines only as told by your health care provider. Get help right away if you have any symptoms of severe dehydration. This information is not intended to replace advice given to you by your health care provider. Make sure you discuss any questions you have with your health care provider. Document Revised: 03/04/2022 Document Reviewed: 06/08/2019 Elsevier Patient Education  2023 [employer] Inc[employer]  

## 2022-08-28 NOTE — Telephone Encounter (Signed)
Patient is requesting a call back.

## 2022-08-28 NOTE — Telephone Encounter (Signed)
Just take MiraLAX and not Dulcolax for colonoscopy prep. Also if he could get stool for GI pathogens and calprotectin ASAP. Please make sure we get CBC, CMP, CRP when she comes for colonoscopy.  Please order it. RG

## 2022-08-28 NOTE — Telephone Encounter (Signed)
Left message for pt to call back  °

## 2022-08-28 NOTE — Telephone Encounter (Signed)
Pt was made aware of Dr. Lyndel Safe recommendations: Just take MiraLAX and not Dulcolax for colonoscopy prep. Order for labs for stool  placed in Epic: Pt made aware to come by our lab and get a stool specimen kit to provide stool sample: Location to lab provided:  Orders placed for CBC, CMP, CRP when she comes for colonoscopy: Pt made aware: Pt verbalized understanding with all questions answered.

## 2022-08-28 NOTE — Telephone Encounter (Signed)
Pt stated that she is currently at Dr. Burney Gauze office receiving IV fluids due to dehydration from diarrhea:  Patient stated she has been having issues with diarrhea and is getting dehydrated.  Pt stated that she is scheduled to have a colonoscopy and endoscopy with Dr. Lyndel Safe on 10/24. Patient stated that the hospital called and advised her that she would need to call to discuss with [place] about her issues with diarrhea and the prep:  Pt questioning can she just take the MiraLAX and not the Doculax tablets or if she could modify the prep in some way:  Please advise

## 2022-08-29 ENCOUNTER — Encounter: Payer: Self-pay | Admitting: Hematology & Oncology

## 2022-08-30 NOTE — Progress Notes (Signed)
Agree with assessment/plan.  Raj Wisdom Seybold, MD [facility] [phone]  

## 2022-08-31 ENCOUNTER — Other Ambulatory Visit: Payer: Medicare Other

## 2022-08-31 DIAGNOSIS — A09 Infectious gastroenteritis and colitis, unspecified: Secondary | ICD-10-CM | POA: Diagnosis not present

## 2022-08-31 DIAGNOSIS — R197 Diarrhea, unspecified: Secondary | ICD-10-CM | POA: Diagnosis not present

## 2022-09-01 ENCOUNTER — Other Ambulatory Visit: Payer: Self-pay

## 2022-09-01 ENCOUNTER — Encounter (HOSPITAL_COMMUNITY): Payer: Self-pay | Admitting: Gastroenterology

## 2022-09-01 ENCOUNTER — Ambulatory Visit (HOSPITAL_COMMUNITY)
Admission: RE | Admit: 2022-09-01 | Discharge: 2022-09-01 | Disposition: A | Discharge status: Home / Self Care | Payer: Medicare Other | Attending: Gastroenterology | Admitting: Gastroenterology

## 2022-09-01 ENCOUNTER — Ambulatory Visit (HOSPITAL_COMMUNITY): Payer: Medicare Other | Admitting: Anesthesiology

## 2022-09-01 ENCOUNTER — Ambulatory Visit (HOSPITAL_BASED_OUTPATIENT_CLINIC_OR_DEPARTMENT_OTHER): Payer: Medicare Other | Admitting: Anesthesiology

## 2022-09-01 ENCOUNTER — Encounter (HOSPITAL_COMMUNITY)
Admission: RE | Disposition: A | Discharge status: Home / Self Care | Payer: Self-pay | Source: Home / Self Care | Attending: Gastroenterology

## 2022-09-01 DIAGNOSIS — Z8673 Personal history of transient ischemic attack (TIA), and cerebral infarction without residual deficits: Secondary | ICD-10-CM | POA: Insufficient documentation

## 2022-09-01 DIAGNOSIS — I509 Heart failure, unspecified: Secondary | ICD-10-CM | POA: Insufficient documentation

## 2022-09-01 DIAGNOSIS — K760 Fatty (change of) liver, not elsewhere classified: Secondary | ICD-10-CM

## 2022-09-01 DIAGNOSIS — R112 Nausea with vomiting, unspecified: Secondary | ICD-10-CM

## 2022-09-01 DIAGNOSIS — K5730 Diverticulosis of large intestine without perforation or abscess without bleeding: Secondary | ICD-10-CM | POA: Insufficient documentation

## 2022-09-01 DIAGNOSIS — G4733 Obstructive sleep apnea (adult) (pediatric): Secondary | ICD-10-CM | POA: Diagnosis not present

## 2022-09-01 DIAGNOSIS — R1013 Epigastric pain: Secondary | ICD-10-CM

## 2022-09-01 DIAGNOSIS — Z95810 Presence of automatic (implantable) cardiac defibrillator: Secondary | ICD-10-CM | POA: Insufficient documentation

## 2022-09-01 DIAGNOSIS — K56600 Partial intestinal obstruction, unspecified as to cause: Secondary | ICD-10-CM

## 2022-09-01 DIAGNOSIS — K56609 Unspecified intestinal obstruction, unspecified as to partial versus complete obstruction: Secondary | ICD-10-CM | POA: Diagnosis not present

## 2022-09-01 DIAGNOSIS — K2100 Gastro-esophageal reflux disease with esophagitis, without bleeding: Secondary | ICD-10-CM | POA: Insufficient documentation

## 2022-09-01 DIAGNOSIS — C9000 Multiple myeloma not having achieved remission: Secondary | ICD-10-CM | POA: Diagnosis not present

## 2022-09-01 DIAGNOSIS — K589 Irritable bowel syndrome without diarrhea: Secondary | ICD-10-CM | POA: Diagnosis not present

## 2022-09-01 DIAGNOSIS — I110 Hypertensive heart disease with heart failure: Secondary | ICD-10-CM

## 2022-09-01 DIAGNOSIS — Z79899 Other long term (current) drug therapy: Secondary | ICD-10-CM | POA: Diagnosis not present

## 2022-09-01 DIAGNOSIS — R197 Diarrhea, unspecified: Secondary | ICD-10-CM

## 2022-09-01 DIAGNOSIS — K580 Irritable bowel syndrome with diarrhea: Secondary | ICD-10-CM

## 2022-09-01 DIAGNOSIS — K640 First degree hemorrhoids: Secondary | ICD-10-CM | POA: Diagnosis not present

## 2022-09-01 HISTORY — DX: Presence of cardiac pacemaker: Z95.0

## 2022-09-01 HISTORY — PX: ESOPHAGOGASTRODUODENOSCOPY (EGD) WITH PROPOFOL: SHX5813

## 2022-09-01 HISTORY — PX: COLONOSCOPY WITH PROPOFOL: SHX5780

## 2022-09-01 HISTORY — PX: BIOPSY: SHX5522

## 2022-09-01 LAB — CBC WITH DIFFERENTIAL/PLATELET
Abs Immature Granulocytes: 0 10*3/uL (ref 0.00–0.07)
Basophils Absolute: 0 10*3/uL (ref 0.0–0.1)
Basophils Relative: 1 %
Eosinophils Absolute: 0.1 10*3/uL (ref 0.0–0.5)
Eosinophils Relative: 4 %
HCT: 36.6 % (ref 36.0–46.0)
Hemoglobin: 11.7 g/dL — ABNORMAL LOW (ref 12.0–15.0)
Immature Granulocytes: 0 %
Lymphocytes Relative: 41 %
Lymphs Abs: 0.9 10*3/uL (ref 0.7–4.0)
MCH: 28.4 pg (ref 26.0–34.0)
MCHC: 32 g/dL (ref 30.0–36.0)
MCV: 88.8 fL (ref 80.0–100.0)
Monocytes Absolute: 0.3 10*3/uL (ref 0.1–1.0)
Monocytes Relative: 12 %
Neutro Abs: 1 10*3/uL — ABNORMAL LOW (ref 1.7–7.7)
Neutrophils Relative %: 42 %
Platelets: 119 10*3/uL — ABNORMAL LOW (ref 150–400)
RBC: 4.12 MIL/uL (ref 3.87–5.11)
RDW: 13.5 % (ref 11.5–15.5)
WBC: 2.3 10*3/uL — ABNORMAL LOW (ref 4.0–10.5)
nRBC: 0 % (ref 0.0–0.2)

## 2022-09-01 LAB — COMPREHENSIVE METABOLIC PANEL
ALT: 16 U/L (ref 0–44)
AST: 38 U/L (ref 15–41)
Albumin: 3.4 g/dL — ABNORMAL LOW (ref 3.5–5.0)
Alkaline Phosphatase: 42 U/L (ref 38–126)
Anion gap: 7 (ref 5–15)
BUN: 12 mg/dL (ref 8–23)
CO2: 25 mmol/L (ref 22–32)
Calcium: 9 mg/dL (ref 8.9–10.3)
Chloride: 103 mmol/L (ref 98–111)
Creatinine, Ser: 0.71 mg/dL (ref 0.44–1.00)
GFR, Estimated: >60 mL/min (ref >60)
Glucose, Bld: 107 mg/dL — ABNORMAL HIGH (ref 70–99)
Potassium: 3.1 mmol/L — ABNORMAL LOW (ref 3.5–5.1)
Sodium: 135 mmol/L (ref 135–145)
Total Bilirubin: 0.6 mg/dL (ref 0.3–1.2)
Total Protein: 8.9 g/dL — ABNORMAL HIGH (ref 6.5–8.1)

## 2022-09-01 LAB — C-REACTIVE PROTEIN: CRP: 0.6 mg/dL (ref <1.0)

## 2022-09-01 SURGERY — ESOPHAGOGASTRODUODENOSCOPY (EGD) WITH PROPOFOL
Anesthesia: Monitor Anesthesia Care

## 2022-09-01 SURGERY — COLONOSCOPY WITH PROPOFOL
Anesthesia: Monitor Anesthesia Care

## 2022-09-01 MED ORDER — PHENYLEPHRINE HCL-NACL 20-0.9 MG/250ML-% IV SOLN
INTRAVENOUS | Status: DC | PRN
  Administered 2022-09-01: 25 ug/min via INTRAVENOUS

## 2022-09-01 MED ORDER — DICYCLOMINE HCL 10 MG PO CAPS: 10.0000 mg | ORAL_CAPSULE | Freq: Three times a day (TID) | ORAL | 0 refills | Status: DC

## 2022-09-01 MED ORDER — LIDOCAINE 2% (20 MG/ML) 5 ML SYRINGE
INTRAMUSCULAR | Status: DC | PRN
  Administered 2022-09-01: 40 mg via INTRAVENOUS

## 2022-09-01 MED ORDER — PHENYLEPHRINE HCL (PRESSORS) 10 MG/ML IV SOLN
INTRAVENOUS | Status: AC
  Filled 2022-09-01: qty 1

## 2022-09-01 MED ORDER — PROPOFOL 10 MG/ML IV BOLUS
INTRAVENOUS | Status: DC | PRN
  Administered 2022-09-01: 10 mg via INTRAVENOUS

## 2022-09-01 MED ORDER — LACTATED RINGERS IV SOLN
INTRAVENOUS | Status: DC
  Administered 2022-09-01: 1000 mL via INTRAVENOUS

## 2022-09-01 MED ORDER — POTASSIUM CHLORIDE CRYS ER 20 MEQ PO TBCR: 20.0000 meq | EXTENDED_RELEASE_TABLET | Freq: Every day | ORAL | 2 refills | Status: DC

## 2022-09-01 MED ORDER — LACTATED RINGERS IV SOLN: INTRAVENOUS | Status: DC | PRN

## 2022-09-01 MED ORDER — PROPOFOL 500 MG/50ML IV EMUL
INTRAVENOUS | Status: AC
  Filled 2022-09-01: qty 250

## 2022-09-01 MED ORDER — PHENYLEPHRINE 80 MCG/ML (10ML) SYRINGE FOR IV PUSH (FOR BLOOD PRESSURE SUPPORT)
PREFILLED_SYRINGE | INTRAVENOUS | Status: DC | PRN
  Administered 2022-09-01: 160 ug via INTRAVENOUS
  Administered 2022-09-01: 80 ug via INTRAVENOUS

## 2022-09-01 MED ORDER — PROPOFOL 500 MG/50ML IV EMUL
INTRAVENOUS | Status: DC | PRN
  Administered 2022-09-01: 125 ug/kg/min via INTRAVENOUS

## 2022-09-01 MED ORDER — SODIUM CHLORIDE 0.9 % IV SOLN: INTRAVENOUS | Status: DC

## 2022-09-01 MED ORDER — PROPOFOL 1000 MG/100ML IV EMUL
INTRAVENOUS | Status: AC
  Filled 2022-09-01: qty 100

## 2022-09-01 SURGICAL SUPPLY — 25 items

## 2022-09-01 NOTE — H&P (Signed)
Chief Complaint: Epigastric abdominal pain and diarrhea   HPI:    Mrs. Tami [name] is a 66 year old African-American female, known to Dr. Lyndel Safe, with a past medical history as listed below including anxiety, multiple myeloma, hypertension, CHF status post AICD, OSA and history of TIA, who was referred to me by Mosie Lukes, MD for a complaint of epigastric abdominal pain and diarrhea.      Dr. Lyndel Safe previously contacted by patient's oncologist with request for endoscopy and colonoscopy.  These are scheduled 09/01/2022 at [facility] at 730.  Cardiac clearance was previously sent to Dr. Adrian Prows on 08/06/2022.    05/28/2022 office visit with Dr. Lyndel Safe for epigastric pain with nausea and vomiting which was thought possibly functional.  Negative GES February 2023 and negative previous extensive GI work-up.  Discussed history of recent PSBO due to adhesions, CT 04/2022 with a stable 1 cm polypoid appearing area of soft tissue attenuation within the lumen of the loop of distal small bowel.  IBS-D with previous history of constipation, negative colon to TI intubation and random colon biopsy December 2022, negative stool studies, multiple CTs.  Discussed possible bile acid diarrhea status postcholecystectomy or due to meds (magnesium).  At that time ordered CTE as below and continued Colestipol 2 g twice daily as well as restarted Protonix 40 mg twice daily.    06/30/2022 CT enteroabdomen and pelvis with contrast with no acute findings.    07/21/2022 CT the abdomen pelvis with contrast showed no acute intra-abdominal process.    08/14/2022 ER visit for ongoing diarrhea and epigastric abdominal pain.  WBC low at 2.2 (appears consistent over the past 2 months), hemoglobin 11.5 (around baseline).  Platelets 102.  CMP with albumin low at 3.2.  Lipase normal.    Today, the patient tells me that she has continued with diarrhea and epigastric pain though the nausea and vomiting has gotten a little bit  better.  She was seen by Dr. Lyndel Safe in late July as above but has been back to the ER on multiple occasions typically for dehydration.  Patient tells me she continues with 5 or more loose watery stools a day regardless of taking for Loperamide and 2 Lomotil along with her 2 g of Colestid twice daily.  Tells me she tries to stay hydrated and eat enough food, but tends to just feel ill.  Continues with abdominal pain/discomfort regardless of Tylenol.  She has tried Hyoscyamine 0.125 mg sublingual tabs but does not feel like it helps that much.  The only thing that even slightly touches the pain is Ibuprofen 800 mg twice a day which she does not have any more of.  Recently Dr. Marin Olp her oncologist stopped her magnesium oxide thinking this was adding to her diarrhea and also stopped her Pantoprazole which she was taking 40 mg twice a day because it was not helping with epigastric pain and he thought may be also adding to diarrhea.  Patient tells me she is frustrated with ongoing symptoms.  She has been on Carafate previously with no help either.    Denies fever, chills, weight loss or blood in her stool.       Past Medical History:  Diagnosis Date   Abnormal SPEP 07/26/2016   Arthritis      knees, hands   Back pain 07/14/2016   Bowel obstruction ([place]) 11/2014   C. 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   Iron deficiency anemia due to chronic blood loss 05/28/2022   Low back pain 07/14/2016   Monoclonal gammopathy of unknown significance (MGUS) 09/16/2016   Multiple myeloma ([place]) 07/20/2018   Multiple myeloma not having achieved remission ([place]) 07/20/2018   Myalgia 12/31/2016   Obstructive sleep apnea  03/31/2015   SOB (shortness of breath) 04/09/2016   Stroke ([place])      TIAs   UTI (urinary tract infection)             Past Surgical History:  Procedure Laterality Date   ABDOMINAL HYSTERECTOMY        menorraghia, 2006, total   CHOLECYSTECTOMY       COLONOSCOPY   2018    [facility] per patient   ESOPHAGOGASTRODUODENOSCOPY   2018    [facility]    KNEE SURGERY        right, repair torn torn cartialage   PACEMAKER INSERTION   08/2014   TONSILLECTOMY       TUBAL LIGATION                Current Outpatient Medications  Medication Sig Dispense Refill   acetaminophen (TYLENOL) 500 MG tablet Take 500 mg by mouth in the morning.       albuterol (VENTOLIN HFA) 108 (90 Base) MCG/ACT inhaler Inhale 2 puffs into the lungs every 6 (six) hours as needed for wheezing or shortness of breath. 18 g 5   aspirin EC 81 MG tablet Take 1 tablet (81 mg total) by mouth daily. 90 tablet 1   carvedilol (COREG) 25 MG tablet Take 1 tablet (25 mg total) by mouth 2 (two) times daily with a meal. 180 tablet 1   cetirizine (ZYRTEC) 10 MG tablet Take 10 mg by mouth daily as needed for allergies.       Cholecalciferol (VITAMIN D-3) 25 MCG (1000 UT) CAPS Take 1 capsule (1,000 Units total) by mouth daily. 60 capsule 2   colestipol (COLESTID) 1 g tablet Take 2 tablets (2 g total) by mouth every 12 (twelve) hours. 120 tablet 1   diphenoxylate-atropine (LOMOTIL) 2.5-0.025 MG tablet Take 1 tablet by mouth 4 (four) times daily as needed for diarrhea or loose stools. 100 tablet 0   fluticasone (FLONASE) 50 MCG/ACT nasal spray Place 2 sprays into both nostrils daily. (Patient taking differently: Place 2 sprays into both nostrils every evening.) 16 g 0   furosemide (LASIX) 20 MG tablet Take 1 tablet by mouth daily as needed for fluid.       hyoscyamine (LEVSIN SL) 0.125 MG SL tablet Place 1 tablet (0.125 mg total) under the tongue every 4 (four) hours as needed. 30 tablet 1   ibuprofen (ADVIL) 800 MG  tablet Take 1 tablet (800 mg total) by mouth 3 (three) times daily. (Patient taking differently: Take 800 mg by mouth every 8 (eight) hours as needed for moderate pain.) 21 tablet 0   loperamide (IMODIUM) 2 MG capsule Take 1 capsule (2 mg total) by mouth 4 (four) times daily as needed for diarrhea or loose stools. 12 capsule 0   loratadine (CLARITIN) 10 MG tablet Take 1 tablet (10 mg total) by mouth daily. (  Patient taking differently: Take 10 mg by mouth daily as needed for allergies.) 30 tablet 11   losartan (COZAAR) 50 MG tablet Take 50 mg by mouth daily.       magnesium oxide (MAG-OX) 400 MG tablet Take 1 tablet (400 mg total) by mouth 2 (two) times daily. Hold if you have diarrhea 60 tablet 1   Multiple Vitamins-Minerals (MULTIVITAMIN ADULT PO) Take 1 tablet by mouth daily.       oxyCODONE-acetaminophen (PERCOCET/ROXICET) 5-325 MG tablet Take 1 tablet by mouth every 6 (six) hours as needed for severe pain.       pantoprazole (PROTONIX) 40 MG tablet Take 1 tablet (40 mg total) by mouth 2 (two) times daily. 180 tablet 4   potassium chloride SA (KLOR-CON M) 20 MEQ tablet TAKE 1 TABLET BY MOUTH EVERY MONDAY, WEDNESDAY, AND FRIDAY. FOLLOW UP WITH MDTO MONITOR POTASSIUM LEVEL 30 tablet 0   Probiotic Product (PROBIOTIC PO) Take 1 capsule by mouth daily.       prochlorperazine (COMPAZINE) 10 MG tablet Take 10 mg by mouth every 6 (six) hours as needed (Nausea or vomiting). 30 tablet 1   promethazine (PHENERGAN) 25 MG tablet Take 1 tablet (25 mg total) by mouth every 8 (eight) hours as needed for up to 15 doses for nausea or vomiting. 15 tablet 0   spironolactone (ALDACTONE) 25 MG tablet Take 12.5 mg by mouth daily.       sucralfate (CARAFATE) 1 g tablet Take 1 tablet (1 g total) by mouth 4 (four) times daily -  with meals and at bedtime. 120 tablet 2    No current facility-administered medications for this visit.           Allergies as of 08/17/2022 - Review Complete 08/17/2022  Allergen Reaction  Noted   Benazepril Anaphylaxis, Swelling, and Hives 09/15/2016   Ondansetron hcl Hives 07/05/2017   Codeine Nausea And Vomiting 02/21/2015   Morphine Hives 07/05/2017           Family History  Problem Relation Age of Onset   Diabetes Mother     Hypertension Mother     Heart disease Mother          s/p 1 stent   Hyperlipidemia Mother     Arthritis Mother     Cancer Father          COLON   Colon cancer Father 90   Irritable bowel syndrome Sister     Hyperlipidemia Daughter     Hypertension Daughter     Hypertension Maternal Grandmother     Arthritis Maternal Grandmother     Heart disease Maternal Grandfather          MI   Hypertension Maternal Grandfather     Arthritis Maternal Grandfather     Hypertension Son     Esophageal cancer Neg Hx        Social History         Socioeconomic History   Marital status: Divorced      Spouse name: [name]   Number of children: 3   Years of education: [name]   Highest education level: High school graduate  Occupational History   [name]  Tobacco Use   Smoking status: Never   Smokeless tobacco: Never  Vaping Use   Vaping Use: Never used  Substance and Sexual Activity   Alcohol use: No   Drug use: No   Sexual activity: [name]  Other Topics Concern  [name]  Social History Narrative    Lives at home alone    Retired    Caffeine: coffee    Social Determinants of Health        Financial Resource Strain: Low Risk  (10/17/2021)    Overall Financial Resource Strain (CARDIA)     Difficulty of Paying Living Expenses: Not hard at all  Food Insecurity: No Food Insecurity (10/17/2021)    Hunger Vital Sign     Worried About Running Out of Food in the Last Year: Never true     Ran Out of Food in the Last Year: Never true  Transportation Needs: No Transportation Needs (10/17/2021)    PRAPARE - Armed forces logistics/support/administrative officer (Medical): No     Lack of Transportation (Non-Medical): No  Physical  Activity: Insufficiently Active (10/17/2021)    Exercise Vital Sign     Days of Exercise per Week: 2 days     Minutes of Exercise per Session: 30 min  Stress: No Stress Concern Present (10/17/2021)    [place]     Feeling of Stress : Not at all  Social Connections: Moderately Isolated (10/17/2021)    Social Connection and Isolation Panel [NHANES]     Frequency of Communication with Friends and Family: More than three times a week     Frequency of Social Gatherings with Friends and Family: More than three times a week     Attends Religious Services: More than 4 times per year     Active Member of [employer] or Organizations: No     Attends Archivist Meetings: Never     Marital Status: Divorced  Human resources officer Violence: Not At Risk (10/17/2021)    Humiliation, Afraid, Rape, and Kick questionnaire     Fear of Current or Ex-Partner: No     Emotionally Abused: No     Physically Abused: No     Sexually Abused: No      Review of Systems:    Constitutional: No weight loss, fever or chills Cardiovascular: No chest pain Respiratory: No SOB  Gastrointestinal: See HPI and otherwise negative    Physical Exam:  Vital signs: BP (!) 140/82   Pulse 94   Ht $R'5\' 5"'UV$  (1.651 m)   Wt 192 lb (87.1 kg)   BMI 31.95 kg/m     Constitutional:   Pleasant AA female appears to be in NAD, Well developed, Well nourished, alert and cooperative Respiratory: Respirations even and unlabored. Lungs clear to auscultation bilaterally.   No wheezes, crackles, or rhonchi.  Cardiovascular: Normal S1, S2. No MRG. Regular rate and rhythm. No peripheral edema, cyanosis or pallor.  Gastrointestinal:  Soft, nondistended, nontender. No rebound or guarding. Normal bowel sounds. No appreciable masses or hepatomegaly. Rectal:  Not performed.  Psychiatric: Oriented to person, place and time. Demonstrates good judgement and reason without abnormal affect or  behaviors.   RELEVANT LABS AND IMAGING: CBC         Component Value Date/Time    WBC 2.2 (L) 08/14/2022 1637    RBC 3.96 08/14/2022 1637    HGB 11.5 (L) 08/14/2022 1637    HGB 11.7 (L) 08/03/2022 0936    HGB 11.1 (L) 11/08/2017 1127    HCT 34.7 (L) 08/14/2022 1637    HCT 34.1 (L) 11/08/2017 1127    PLT 102 (L) 08/14/2022 1637    PLT 136 (L) 08/03/2022 0936    PLT 145  11/08/2017 1127    MCV 87.6 08/14/2022 1637    MCV 88 11/08/2017 1127    MCH 29.0 08/14/2022 1637    MCHC 33.1 08/14/2022 1637    RDW 13.5 08/14/2022 1637    RDW 14.6 11/08/2017 1127    LYMPHSABS 0.9 08/14/2022 1637    LYMPHSABS 0.9 11/08/2017 1127    MONOABS 0.3 08/14/2022 1637    EOSABS 0.1 08/14/2022 1637    EOSABS 0.1 11/08/2017 1127    BASOSABS 0.0 08/14/2022 1637    BASOSABS 0.0 11/08/2017 1127      CMP             Component Value Date/Time    NA 138 08/14/2022 1637    NA 143 11/08/2017 1127    NA 140 12/31/2016 1000    K 3.7 08/14/2022 1637    K 4.0 11/08/2017 1127    K 3.6 12/31/2016 1000    CL 107 08/14/2022 1637    CL 107 11/08/2017 1127    CO2 25 08/14/2022 1637    CO2 25 11/08/2017 1127    CO2 24 12/31/2016 1000    GLUCOSE 137 (H) 08/14/2022 1637    GLUCOSE 96 11/08/2017 1127    BUN 14 08/14/2022 1637    BUN 19 11/08/2017 1127    BUN 22.3 12/31/2016 1000    CREATININE 0.58 08/14/2022 1637    CREATININE 0.67 08/03/2022 0936    CREATININE 0.9 11/08/2017 1127    CREATININE 0.8 12/31/2016 1000    CALCIUM 8.7 (L) 08/14/2022 1637    CALCIUM 9.9 11/08/2017 1127    CALCIUM 9.5 12/31/2016 1000    PROT 8.4 (H) 08/14/2022 1637    PROT 9.4 (H) 11/08/2017 1127    PROT 10.3 (H) 11/08/2017 1127    PROT 9.5 (H) 12/31/2016 1000    ALBUMIN 3.2 (L) 08/14/2022 1637    ALBUMIN 3.2 (L) 11/08/2017 1127    ALBUMIN 3.7 12/31/2016 1000    AST 26 08/14/2022 1637    AST 31 08/03/2022 0936    AST 28 12/31/2016 1000    ALT 15 08/14/2022 1637    ALT 16 08/03/2022 0936    ALT 36 11/08/2017 1127    ALT  16 12/31/2016 1000    ALKPHOS 48 08/14/2022 1637    ALKPHOS 44 11/08/2017 1127    ALKPHOS 51 12/31/2016 1000    BILITOT 0.2 (L) 08/14/2022 1637    BILITOT 0.5 08/03/2022 0936    BILITOT 0.46 12/31/2016 1000    GFRNONAA >60 08/14/2022 1637    GFRNONAA >60 08/03/2022 0936    GFRNONAA 82 09/18/2016 1152    GFRAA >60 08/10/2020 1119    GFRAA >60 07/26/2020 1352    GFRAA >89 09/18/2016 1152      Assessment: 1.  Epigastric pain: Ongoing, initially with nausea and vomiting thought possibly functional with negative previous extensive GI work-up and negative GES in February 2023, no help from Pantoprazole 40 twice daily 2.  IBS-D: Ongoing diarrhea 5-6 times a day regardless of Lomotil twice daily, Loperamide 4 times a day and Colestid 2 g twice daily; uncertain etiology 3.  Multiple myeloma   Plan: 1.  Patient is already scheduled for EGD and colonoscopy 10/24 with Dr. Lyndel Safe at the hospital.  She has received cardiac clearance.  She was talked through the bowel prep and instructions today at her visit. 2.  Continue current medications.  Explained to the patient that there is not a lot to add to her at the moment as  she is on pretty much everything or has tried pretty much everything in the past.  Did discuss that I would not recommend a high-dose of ibuprofen going forward even though she felt this was somewhat helpful as it could add to her GI issues. 3.  Patient to follow with Dr. Lyndel Safe per recommendations after time of procedures above.   Ellouise Newer, PA-C [facility]     Attending physician's note   I have taken history, reviewed the chart and examined the patient. I performed a substantive portion of this encounter, including complete performance of at least one of the key components, in conjunction with the APP. I agree with the Advanced Practitioner's note, impression and recommendations.   For EGD/colon today.   Carmell Austria, MD [facility] [phone]

## 2022-09-01 NOTE — Anesthesia Preprocedure Evaluation (Signed)
Anesthesia Evaluation  Patient identified by MRN, date of birth, ID band Patient awake    Reviewed: Allergy & Precautions, NPO status , Patient's Chart, lab work & pertinent test results  Airway Mallampati: II  TM Distance: >3 FB Neck ROM: Full    Dental   Pulmonary sleep apnea ,    breath sounds clear to auscultation       Cardiovascular hypertension, Pt. on medications and Pt. on home beta blockers +CHF (EF <25%)  + pacemaker  Rhythm:Regular Rate:Normal     Neuro/Psych  Headaches,  Neuromuscular disease CVA    GI/Hepatic Neg liver ROS, GERD  ,  Endo/Other  negative endocrine ROS  Renal/GU negative Renal ROS     Musculoskeletal  (+) Arthritis ,   Abdominal   Peds  Hematology  (+) Blood dyscrasia, anemia ,   Anesthesia Other Findings   Reproductive/Obstetrics                             Anesthesia Physical Anesthesia Plan  ASA: 4  Anesthesia Plan: MAC   Post-op Pain Management:    Induction:   PONV Risk Score and Plan: 2 and Propofol infusion and Ondansetron  Airway Management Planned: Natural Airway, Simple Face Mask and Nasal Cannula  Additional Equipment:   Intra-op Plan:   Post-operative Plan:   Informed Consent: I have reviewed the patients History and Physical, chart, labs and discussed the procedure including the risks, benefits and alternatives for the proposed anesthesia with the patient or authorized representative who has indicated his/her understanding and acceptance.       Plan Discussed with:   Anesthesia Plan Comments:         Anesthesia Quick Evaluation

## 2022-09-01 NOTE — Op Note (Signed)
[facility] Patient Name: Tami [name] Procedure Date: 09/01/2022 MRN: [id number] Attending MD: Jackquline Denmark , MD, [id number] Date of Birth: 1956-09-03 CSN: [id number] Age: 66 Admit Type: Outpatient Procedure:                Upper GI endoscopy Indications:              Epigastric abdominal pain with chronic N/V Providers:                Jackquline Denmark, MD, Mikey College, RN, Fransico Setters                            [place], Technician Referring MD:              Medicines:                Monitored Anesthesia Care Complications:            No immediate complications. Estimated Blood Loss:     Estimated blood loss: none. Procedure:                Pre-Anesthesia Assessment:                           - Prior to the procedure, a History and Physical                            was performed, and patient medications and                            allergies were reviewed. The patient's tolerance of                            previous anesthesia was also reviewed. The risks                            and benefits of the procedure and the sedation                            options and risks were discussed with the patient.                            All questions were answered, and informed consent                            was obtained. Prior Anticoagulants: The patient has                            taken no previous anticoagulant or antiplatelet                            agents. ASA Grade Assessment: IV - A patient with                            severe systemic disease that is a constant threat  to life. After reviewing the risks and benefits,                            the patient was deemed in satisfactory condition to                            undergo the procedure.                           After obtaining informed consent, the endoscope was                            passed under direct vision. Throughout the                             procedure, the patient's blood pressure, pulse, and                            oxygen saturations were monitored continuously. The                            [id number]-H190 ([id number] Olympus endoscope was introduced                            through the mouth, and advanced to the second part                            of duodenum. The upper GI endoscopy was                            accomplished without difficulty. The patient                            tolerated the procedure well. Scope In: Scope Out: Findings:      LA Grade A (one or more mucosal breaks less than 5 mm, not extending       between tops of 2 mucosal folds) esophagitis with no bleeding was found       38 cm from the incisors at GE junction, examined by NBI. Biopsies were       taken with a cold forceps for histology.      The entire examined stomach was normal. Biopsies were taken with a cold       forceps for histology.      The examined duodenum was normal. Biopsies for histology were taken with       a cold forceps for evaluation of celiac disease. Impression:               - LA Grade A reflux esophagitis with no bleeding.                            Biopsied.                           - Normal stomach. Biopsied.                           -  Normal examined duodenum. Biopsied. Moderate Sedation:      Not Applicable - Patient had care per Anesthesia. Recommendation:           - Patient has a contact number available for                            emergencies. The signs and symptoms of potential                            delayed complications were discussed with the                            patient. Return to normal activities tomorrow.                            Written discharge instructions were provided to the                            patient.                           - Resume previous diet.                           - Continue present medications. Please resume                            Protonix 40 mg p.o.  daily.                           - Await pathology results.                           - The findings and recommendations were discussed                            with the patient's family.                           - Proceed with colonoscopy. Procedure Code(s):        --- Professional ---                           [phone], Esophagogastroduodenoscopy, flexible,                            transoral; with biopsy, single or multiple Diagnosis Code(s):        --- Professional ---                           K21.00, Gastro-esophageal reflux disease with                            esophagitis, without bleeding                           R10.13, Epigastric pain CPT copyright 2022 American Medical  Association. All rights reserved. The codes documented in this report are preliminary and upon coder review may  be revised to meet current compliance requirements. Jackquline Denmark, MD 09/01/2022 8:18:32 AM This report has been signed electronically. Number of Addenda: 0

## 2022-09-01 NOTE — Transfer of Care (Signed)
Immediate Anesthesia Transfer of Care Note  Patient: Tami [name]  Procedure(s) Performed: ESOPHAGOGASTRODUODENOSCOPY (EGD) WITH PROPOFOL COLONOSCOPY WITH PROPOFOL BIOPSY  Patient Location: PACU and Endoscopy Unit  Anesthesia Type:MAC  Level of Consciousness: sedated  Airway & Oxygen Therapy: Patient Spontanous Breathing and Patient connected to face mask oxygen  Post-op Assessment: Report given to RN and Post -op Vital signs reviewed and stable  Post vital signs: Reviewed and stable  Last Vitals:  Vitals Value Taken Time  BP 114/68 09/01/22 0853  Temp 36.4 C 09/01/22 0853  Pulse 73 09/01/22 0857  Resp 19 09/01/22 0857  SpO2 100 % 09/01/22 0857  Vitals shown include unvalidated device data.  Last Pain:  Vitals:   09/01/22 0853  TempSrc: Temporal  PainSc: 0-No pain         Complications: No notable events documented.

## 2022-09-01 NOTE — Discharge Instructions (Signed)

## 2022-09-01 NOTE — Op Note (Signed)
[facility] Patient Name: Tami [name] Procedure Date: 09/01/2022 MRN: [id number] Attending MD: Jackquline Denmark , MD, [id number] Date of Birth: December 09, 1955 CSN: [id number] Age: 66 Admit Type: Outpatient Procedure:                Colonoscopy Indications:              Clinically significant diarrhea of unexplained                            origin. Neg CT AP with contrast 07/21/2022. Neg CTE                            06/30/2022 (although previous CTE showed ?distal SB                            lesion. Providers:                Jackquline Denmark, MD, Mikey College, RN, Fransico Setters                            Mbumina, Technician Referring MD:              Medicines:                Monitored Anesthesia Care Complications:            No immediate complications. Estimated Blood Loss:     Estimated blood loss: none. Procedure:                Pre-Anesthesia Assessment:                           - Prior to the procedure, a History and Physical                            was performed, and patient medications and                            allergies were reviewed. The patient's tolerance of                            previous anesthesia was also reviewed. The risks                            and benefits of the procedure and the sedation                            options and risks were discussed with the patient.                            All questions were answered, and informed consent                            was obtained. Prior Anticoagulants: The patient has                            taken no previous anticoagulant  or antiplatelet                            agents. ASA Grade Assessment: IV - A patient with                            severe systemic disease that is a constant threat                            to life. After reviewing the risks and benefits,                            the patient was deemed in satisfactory condition to                            undergo the  procedure.                           After obtaining informed consent, the colonoscope                            was passed under direct vision. Throughout the                            procedure, the patient's blood pressure, pulse, and                            oxygen saturations were monitored continuously. The                            PCF-HQ190L ([id number]) Olympus colonoscope was                            introduced through the anus and advanced to the 15                            cm into the ileum. The colonoscopy was performed                            without difficulty. The patient tolerated the                            procedure well. The quality of the bowel                            preparation was good. The terminal ileum, ileocecal                            valve, appendiceal orifice, and rectum were                            photographed. Scope In: 8:21:29 AM Scope Out: 8:43:49 AM Scope Withdrawal Time: 0 hours 14 minutes 59 seconds  Total Procedure Duration: 0 hours 22 minutes 20 seconds  Findings:      The colon (entire examined portion) appeared normal. Biopsies for       histology were taken with a cold forceps from the entire colon for       evaluation of microscopic colitis.      A few small-mouthed diverticula were found in the sigmoid colon.      The terminal ileum appeared normal. Biopsies were taken with a cold       forceps for histology. No lesions were noted.      Non-bleeding internal hemorrhoids were found during retroflexion. The       hemorrhoids were small and Grade I (internal hemorrhoids that do not       prolapse).      The exam was otherwise without abnormality on direct and retroflexion       views. Impression:               - The entire examined colon is normal. Biopsied.                           - Minimal sigmoid diverticulosis.                           - The examined portion of the ileum was normal.                            Deep  TI intubation was performed. Biopsied.                           - Non-bleeding internal hemorrhoids.                           - The examination was otherwise normal on direct                            and retroflexion views. Moderate Sedation:      Not Applicable - Patient had care per Anesthesia. Recommendation:           - Patient has a contact number available for                            emergencies. The signs and symptoms of potential                            delayed complications were discussed with the                            patient. Return to normal activities tomorrow.                            Written discharge instructions were provided to the                            patient.                           - Resume previous diet.                           -  Continue present medications.                           - Await pathology results.                           - Trial of Bentyl 10 mg p.o. twice daily                           - FU if still with problems. I do believe anxiety                            may be playing a significant role.                           - The findings and recommendations were discussed                            with the patient's family. Procedure Code(s):        --- Professional ---                           [phone], Colonoscopy, flexible; with biopsy, single                            or multiple Diagnosis Code(s):        --- Professional ---                           K64.0, First degree hemorrhoids                           R19.7, Diarrhea, unspecified                           K57.30, Diverticulosis of large intestine without                            perforation or abscess without bleeding CPT copyright 2022 [employer]. All rights reserved. The codes documented in this report are preliminary and upon coder review may  be revised to meet current compliance requirements. Jackquline Denmark, MD 09/01/2022 8:55:11 AM This  report has been signed electronically. Number of Addenda: 0

## 2022-09-01 NOTE — Anesthesia Procedure Notes (Signed)
Date/Time: 09/01/2022 7:44 AM  Performed by: Cynda Familia, CRNAPre-anesthesia Checklist: Patient identified, Emergency Drugs available, Suction available, Patient being monitored and Timeout performed Patient Re-evaluated:Patient Re-evaluated prior to induction Oxygen Delivery Method: Simple face mask Placement Confirmation: positive ETCO2 and breath sounds checked- equal and bilateral Dental Injury: Teeth and Oropharynx as per pre-operative assessment  Comments: Bite block by RN

## 2022-09-02 LAB — SURGICAL PATHOLOGY

## 2022-09-02 NOTE — Anesthesia Postprocedure Evaluation (Signed)
Anesthesia Post Note  Patient: Tami [name]  Procedure(s) Performed: ESOPHAGOGASTRODUODENOSCOPY (EGD) WITH PROPOFOL COLONOSCOPY WITH PROPOFOL BIOPSY     Patient location during evaluation: PACU Anesthesia Type: MAC Level of consciousness: awake and alert Pain management: pain level controlled Vital Signs Assessment: post-procedure vital signs reviewed and stable Respiratory status: spontaneous breathing, nonlabored ventilation, respiratory function stable and patient connected to nasal cannula oxygen Cardiovascular status: stable and blood pressure returned to baseline Postop Assessment: no apparent nausea or vomiting Anesthetic complications: no   No notable events documented.  Last Vitals:  Vitals:   09/01/22 0920 09/01/22 0930  BP: 138/89 (!) 157/96  Pulse: 77 73  Resp: 18 18  Temp:    SpO2: 96% 94%    Last Pain:  Vitals:   09/01/22 0930  TempSrc:   PainSc: 0-No pain                 [name]

## 2022-09-03 ENCOUNTER — Encounter (HOSPITAL_COMMUNITY): Payer: Self-pay | Admitting: Gastroenterology

## 2022-09-03 LAB — GI PROFILE, STOOL, PCR

## 2022-09-03 LAB — CALPROTECTIN, FECAL: Calprotectin, Fecal: 7 ug/g (ref 0–120)

## 2022-09-06 ENCOUNTER — Encounter: Payer: Self-pay | Admitting: Gastroenterology

## 2022-09-07 ENCOUNTER — Ambulatory Visit: Payer: Medicare Other | Admitting: Family Medicine

## 2022-09-08 ENCOUNTER — Other Ambulatory Visit (HOSPITAL_BASED_OUTPATIENT_CLINIC_OR_DEPARTMENT_OTHER): Payer: Self-pay

## 2022-09-08 DIAGNOSIS — Z95810 Presence of automatic (implantable) cardiac defibrillator: Secondary | ICD-10-CM | POA: Diagnosis not present

## 2022-09-08 DIAGNOSIS — I5042 Chronic combined systolic (congestive) and diastolic (congestive) heart failure: Secondary | ICD-10-CM | POA: Diagnosis not present

## 2022-09-08 DIAGNOSIS — I480 Paroxysmal atrial fibrillation: Secondary | ICD-10-CM | POA: Diagnosis not present

## 2022-09-08 DIAGNOSIS — I428 Other cardiomyopathies: Secondary | ICD-10-CM | POA: Diagnosis not present

## 2022-09-08 MED ORDER — COMIRNATY 30 MCG/0.3ML IM SUSY
PREFILLED_SYRINGE | INTRAMUSCULAR | 0 refills | Status: DC
  Filled 2022-09-08: qty 0.3, 1d supply, fill #0

## 2022-09-09 DIAGNOSIS — I428 Other cardiomyopathies: Secondary | ICD-10-CM | POA: Diagnosis not present

## 2022-09-11 DIAGNOSIS — I480 Paroxysmal atrial fibrillation: Secondary | ICD-10-CM | POA: Diagnosis not present

## 2022-09-11 DIAGNOSIS — I428 Other cardiomyopathies: Secondary | ICD-10-CM | POA: Diagnosis not present

## 2022-09-11 DIAGNOSIS — Z8679 Personal history of other diseases of the circulatory system: Secondary | ICD-10-CM | POA: Diagnosis not present

## 2022-09-15 ENCOUNTER — Other Ambulatory Visit (HOSPITAL_BASED_OUTPATIENT_CLINIC_OR_DEPARTMENT_OTHER): Payer: Self-pay

## 2022-09-15 DIAGNOSIS — E559 Vitamin D deficiency, unspecified: Secondary | ICD-10-CM | POA: Diagnosis not present

## 2022-09-15 DIAGNOSIS — E785 Hyperlipidemia, unspecified: Secondary | ICD-10-CM | POA: Diagnosis not present

## 2022-09-15 DIAGNOSIS — C9000 Multiple myeloma not having achieved remission: Secondary | ICD-10-CM | POA: Diagnosis not present

## 2022-09-15 DIAGNOSIS — E611 Iron deficiency: Secondary | ICD-10-CM | POA: Diagnosis not present

## 2022-09-15 DIAGNOSIS — N183 Chronic kidney disease, stage 3 unspecified: Secondary | ICD-10-CM | POA: Diagnosis not present

## 2022-09-15 DIAGNOSIS — I719 Aortic aneurysm of unspecified site, without rupture: Secondary | ICD-10-CM | POA: Diagnosis not present

## 2022-09-15 DIAGNOSIS — I509 Heart failure, unspecified: Secondary | ICD-10-CM | POA: Diagnosis not present

## 2022-09-15 DIAGNOSIS — R030 Elevated blood-pressure reading, without diagnosis of hypertension: Secondary | ICD-10-CM | POA: Diagnosis not present

## 2022-09-15 DIAGNOSIS — I429 Cardiomyopathy, unspecified: Secondary | ICD-10-CM | POA: Diagnosis not present

## 2022-09-15 DIAGNOSIS — R7309 Other abnormal glucose: Secondary | ICD-10-CM | POA: Diagnosis not present

## 2022-09-15 MED ORDER — SHINGRIX 50 MCG/0.5ML IM SUSR
INTRAMUSCULAR | 1 refills | Status: DC
  Filled 2022-09-15: qty 1, 1d supply, fill #0

## 2022-09-16 DIAGNOSIS — Z9989 Dependence on other enabling machines and devices: Secondary | ICD-10-CM | POA: Diagnosis not present

## 2022-09-16 DIAGNOSIS — G4733 Obstructive sleep apnea (adult) (pediatric): Secondary | ICD-10-CM | POA: Diagnosis not present

## 2022-09-17 ENCOUNTER — Emergency Department (HOSPITAL_BASED_OUTPATIENT_CLINIC_OR_DEPARTMENT_OTHER): Payer: Medicare Other

## 2022-09-17 ENCOUNTER — Emergency Department (HOSPITAL_BASED_OUTPATIENT_CLINIC_OR_DEPARTMENT_OTHER)
Admission: EM | Admit: 2022-09-17 | Discharge: 2022-09-17 | Disposition: A | Discharge status: Home / Self Care | Payer: Medicare Other | Attending: Emergency Medicine | Admitting: Emergency Medicine

## 2022-09-17 ENCOUNTER — Other Ambulatory Visit: Payer: Self-pay

## 2022-09-17 ENCOUNTER — Encounter (HOSPITAL_BASED_OUTPATIENT_CLINIC_OR_DEPARTMENT_OTHER): Payer: Self-pay

## 2022-09-17 DIAGNOSIS — R197 Diarrhea, unspecified: Secondary | ICD-10-CM | POA: Insufficient documentation

## 2022-09-17 DIAGNOSIS — Z7982 Long term (current) use of aspirin: Secondary | ICD-10-CM | POA: Diagnosis not present

## 2022-09-17 DIAGNOSIS — R0602 Shortness of breath: Secondary | ICD-10-CM | POA: Diagnosis not present

## 2022-09-17 DIAGNOSIS — I10 Essential (primary) hypertension: Secondary | ICD-10-CM | POA: Diagnosis not present

## 2022-09-17 DIAGNOSIS — R0789 Other chest pain: Secondary | ICD-10-CM | POA: Diagnosis not present

## 2022-09-17 LAB — URINALYSIS, ROUTINE W REFLEX MICROSCOPIC
Bilirubin Urine: NEGATIVE
Glucose, UA: NEGATIVE mg/dL
Hgb urine dipstick: NEGATIVE
Ketones, ur: NEGATIVE mg/dL
Leukocytes,Ua: NEGATIVE
Nitrite: NEGATIVE
Protein, ur: 30 mg/dL — AB
Specific Gravity, Urine: >=1.03 (ref 1.005–1.030)
pH: 5.5 (ref 5.0–8.0)

## 2022-09-17 LAB — CBC
HCT: 36.9 % (ref 36.0–46.0)
Hemoglobin: 11.9 g/dL — ABNORMAL LOW (ref 12.0–15.0)
MCH: 28.5 pg (ref 26.0–34.0)
MCHC: 32.2 g/dL (ref 30.0–36.0)
MCV: 88.5 fL (ref 80.0–100.0)
Platelets: 107 10*3/uL — ABNORMAL LOW (ref 150–400)
RBC: 4.17 MIL/uL (ref 3.87–5.11)
RDW: 13.7 % (ref 11.5–15.5)
WBC: 2.2 10*3/uL — ABNORMAL LOW (ref 4.0–10.5)
nRBC: 0 % (ref 0.0–0.2)

## 2022-09-17 LAB — URINALYSIS, MICROSCOPIC (REFLEX)

## 2022-09-17 LAB — COMPREHENSIVE METABOLIC PANEL
ALT: 11 U/L (ref 0–44)
AST: 24 U/L (ref 15–41)
Albumin: 3.1 g/dL — ABNORMAL LOW (ref 3.5–5.0)
Alkaline Phosphatase: 48 U/L (ref 38–126)
Anion gap: 6 (ref 5–15)
BUN: 14 mg/dL (ref 8–23)
CO2: 25 mmol/L (ref 22–32)
Calcium: 8.8 mg/dL — ABNORMAL LOW (ref 8.9–10.3)
Chloride: 105 mmol/L (ref 98–111)
Creatinine, Ser: 0.66 mg/dL (ref 0.44–1.00)
GFR, Estimated: >60 mL/min (ref >60)
Glucose, Bld: 159 mg/dL — ABNORMAL HIGH (ref 70–99)
Potassium: 3.4 mmol/L — ABNORMAL LOW (ref 3.5–5.1)
Sodium: 136 mmol/L (ref 135–145)
Total Bilirubin: 0.6 mg/dL (ref 0.3–1.2)
Total Protein: 9.1 g/dL — ABNORMAL HIGH (ref 6.5–8.1)

## 2022-09-17 LAB — TROPONIN I (HIGH SENSITIVITY): Troponin I (High Sensitivity): 10 ng/L (ref <18)

## 2022-09-17 LAB — LIPASE, BLOOD: Lipase: 40 U/L (ref 11–51)

## 2022-09-17 MED ORDER — SODIUM CHLORIDE 0.9 % IV BOLUS
1000.0000 mL | Freq: Once | INTRAVENOUS | Status: AC
  Administered 2022-09-17: 1000 mL via INTRAVENOUS

## 2022-09-17 MED ORDER — PROMETHAZINE HCL 25 MG PO TABS: 25.0000 mg | ORAL_TABLET | Freq: Three times a day (TID) | ORAL | 0 refills | Status: DC | PRN

## 2022-09-17 NOTE — Discharge Instructions (Addendum)
Please eat and drink as well as you can.  Follow-up with your family doctor and cardiologist in the office.  Take the nausea medicine as needed for nausea.  Please return for worsening symptoms chest pain if you feel you cannot pass out.

## 2022-09-17 NOTE — ED Triage Notes (Signed)
Pt states that she has been worn out the last few days, lack of energy. Pt thinks that she may be dehydrated and need fluids again. Pt states she has had diarrhea.

## 2022-09-17 NOTE — ED Provider Notes (Signed)
[facility] EMERGENCY DEPARTMENT Provider Note   CSN: [id number] Arrival date & time: 09/17/22  1531     History  Chief Complaint  Patient presents with   Diarrhea    Tami [name] is a 66 y.o. female.  66 yo F with a chief pains of feeling fatigued.  This has been an ongoing issue for her.  She tells me this happens from time to time.  Has had chronic diarrhea that she thinks is gotten a little bit worse over the past few days.  She typically comes to the emergency department and gets fluids and feels a bit better.  She is recently seen her cardiologist to increase some of her medications.  She has a follow-up with them on Monday and she is not sure why.  She has had some difficulty breathing especially when she gets up and tries to move around.  Its not uncommon for her.  She denies any chest pain or pressure.  Denies cough or congestion.  Has had some nausea with this.  Has been able to drink but not eat.   Diarrhea      Home Medications Prior to Admission medications   Medication Sig Start Date End Date Taking? Authorizing Provider  acetaminophen (TYLENOL) 500 MG tablet Take 500 mg by mouth in the morning.    [provider]  acyclovir (ZOVIRAX) 400 MG tablet TAKE ONE (1) TABLET BY MOUTH TWO (2) TIMES DAILY 08/29/22   Ennever, Rudell Cobb, MD  albuterol (VENTOLIN HFA) 108 (90 Base) MCG/ACT inhaler Inhale 2 puffs into the lungs every 6 (six) hours as needed for wheezing or shortness of breath. 11/20/21   Mosie Lukes, MD  aspirin EC 81 MG tablet Take 1 tablet (81 mg total) by mouth daily. Patient taking differently: Take 81 mg by mouth every evening. 02/15/18   Mosie Lukes, MD  carvedilol (COREG) 25 MG tablet Take 1 tablet (25 mg total) by mouth 2 (two) times daily with a meal. 06/04/20   Mosie Lukes, MD  cetirizine (ZYRTEC) 10 MG tablet Take 10 mg by mouth at bedtime.    [provider]  Cholecalciferol (VITAMIN D3) 50 MCG (2000 UT) TABS Take  2,000 Units by mouth in the morning.    [provider]  colestipol (COLESTID) 1 g tablet Take 2 tablets (2 g total) by mouth every 12 (twelve) hours. 04/30/22   Florencia Reasons, MD  COVID-19 mRNA vaccine [phone] (COMIRNATY) syringe Inject into the muscle. 09/08/22   Carlyle Basques, MD  dicyclomine (BENTYL) 10 MG capsule Take 1 capsule (10 mg total) by mouth 4 (four) times daily -  before meals and at bedtime. 09/01/22 10/01/22  Jackquline Denmark, MD  diphenoxylate-atropine (LOMOTIL) 2.5-0.025 MG tablet Take 1 tablet by mouth 4 (four) times daily as needed for diarrhea or loose stools. 06/26/22   [name], MD  fluticasone (FLONASE) 50 MCG/ACT nasal spray Place 2 sprays into both nostrils daily. Patient taking differently: Place 2 sprays into both nostrils daily as needed for allergies. 01/07/20   Joy, Shawn C, PA-C  furosemide (LASIX) 20 MG tablet Take 20 mg by mouth daily as needed (fluid retention (feet swelling)). 07/03/20   [provider]  hyoscyamine (LEVSIN SL) 0.125 MG SL tablet Place 1 tablet (0.125 mg total) under the tongue every 4 (four) hours as needed. 05/21/22   Mosie Lukes, MD  loperamide (IMODIUM) 2 MG capsule Take 1 capsule (2 mg total) by mouth 4 (four)  times daily as needed for diarrhea or loose stools. 07/21/22   Elgie Congo, MD  losartan (COZAAR) 50 MG tablet Take 50 mg by mouth in the morning and at bedtime. 04/14/21   [provider]  magnesium oxide (MAG-OX) 400 MG tablet Take 1 tablet (400 mg total) by mouth 2 (two) times daily. Hold if you have diarrhea 04/30/22   Florencia Reasons, MD  oxyCODONE-acetaminophen (PERCOCET/ROXICET) 5-325 MG tablet Take 1 tablet by mouth every 6 (six) hours as needed for severe pain. 08/25/22   Mosie Lukes, MD  pantoprazole (PROTONIX) 40 MG tablet Take 1 tablet (40 mg total) by mouth 2 (two) times daily. 05/28/22   Jackquline Denmark, MD  potassium chloride SA (KLOR-CON M) 20 MEQ tablet Take 1 tablet (20 mEq total) by mouth  daily. 09/01/22   Jackquline Denmark, MD  Probiotic Product (PROBIOTIC PO) Take 1 capsule by mouth every evening.    [provider]  promethazine (PHENERGAN) 25 MG tablet Take 1 tablet (25 mg total) by mouth every 8 (eight) hours as needed for up to 15 doses for nausea or vomiting. 09/17/22   Deno Etienne, DO  spironolactone (ALDACTONE) 25 MG tablet Take 12.5 mg by mouth every evening.    [provider]  sucralfate (CARAFATE) 1 g tablet Take 1 tablet (1 g total) by mouth 4 (four) times daily -  with meals and at bedtime. 05/21/22   Mosie Lukes, MD  Zoster Vaccine Adjuvanted [facility]) injection Inject into the muscle. 09/15/22   Carlyle Basques, MD      Allergies    Benazepril, Ondansetron hcl, Codeine, and Morphine    Review of Systems   Review of Systems  Gastrointestinal:  Positive for diarrhea.    Physical Exam Updated Vital Signs BP (!) 168/95   Pulse 82   Temp 97.9 F (36.6 C) (Oral)   Resp 20   Ht '5\' 6"'$  (1.676 m)   Wt 86.2 kg   SpO2 97%   BMI 30.67 kg/m  Physical Exam Vitals and nursing note reviewed.  Constitutional:      General: She is not in acute distress.    Appearance: She is well-developed. She is not diaphoretic.  HENT:     Head: Normocephalic and atraumatic.  Eyes:     Pupils: Pupils are equal, round, and reactive to light.  Cardiovascular:     Rate and Rhythm: Normal rate and regular rhythm.     Heart sounds: No murmur heard.    No friction rub. No gallop.  Pulmonary:     Effort: Pulmonary effort is normal.     Breath sounds: No wheezing or rales.  Abdominal:     General: There is no distension.     Palpations: Abdomen is soft.     Tenderness: There is no abdominal tenderness.  Musculoskeletal:        General: No tenderness.     Cervical back: Normal range of motion and neck supple.  Skin:    General: Skin is warm and dry.  Neurological:     Mental Status: She is alert and oriented to person, place, and time.  Psychiatric:         Behavior: Behavior normal.     ED Results / Procedures / Treatments   Labs (all labs ordered are listed, but only abnormal results are displayed) Labs Reviewed  COMPREHENSIVE METABOLIC PANEL - Abnormal; Notable for the following components:      Result Value   Potassium 3.4 (*)  Glucose, Bld 159 (*)    Calcium 8.8 (*)    Total Protein 9.1 (*)    Albumin 3.1 (*)    All other components within normal limits  CBC - Abnormal; Notable for the following components:   WBC 2.2 (*)    Hemoglobin 11.9 (*)    Platelets 107 (*)    All other components within normal limits  URINALYSIS, ROUTINE W REFLEX MICROSCOPIC - Abnormal; Notable for the following components:   APPearance HAZY (*)    Protein, ur 30 (*)    All other components within normal limits  URINALYSIS, MICROSCOPIC (REFLEX) - Abnormal; Notable for the following components:   Bacteria, UA FEW (*)    All other components within normal limits  LIPASE, BLOOD  TROPONIN I (HIGH SENSITIVITY)    EKG EKG Interpretation  Date/Time:  Thursday September 17 2022 18:27:22 EST Ventricular Rate:  88 PR Interval:  169 QRS Duration: 133 QT Interval:  391 QTC Calculation: 474 R Axis:   -54 Text Interpretation: Sinus rhythm Multiple ventricular premature complexes Probable left atrial enlargement Left bundle branch block No significant change since last tracing Confirmed by Deno Etienne [phone]) on 09/17/2022 6:43:39 PM  Radiology DG Chest Port 1 View  Result Date: 09/17/2022 CLINICAL DATA:  Shortness of breath. 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 Stable heart size and mediastinal contours, heart upper normal in size. There is no focal airspace disease, pleural effusion or pneumothorax. Linear oriented density projecting over the posterior right seventh rib is unchanged, significance unknown. Scoliotic curvature of the spine. IMPRESSION: No acute chest findings. 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 Electronically Signed   By: Keith Rake M.D.   On: 09/17/2022 18:42    Procedures Procedures    Medications Ordered in ED Medications  sodium chloride 0.9 % bolus 1,000 mL (0 mLs Intravenous Stopped 09/17/22 1924)    ED Course/ Medical Decision Making/ A&P                           Medical Decision Making Amount and/or Complexity of Data Reviewed Labs: ordered. Radiology: ordered. ECG/medicine tests: ordered.  Risk Prescription drug management.   66 yo F with a chief complaints of fatigue.  Patient is tachypneic on my initial exam.  Has clear lung sounds.  Will obtain a troponin and chest x-ray.  Give a bolus of IV fluids as that seems to have helped her on her prior ED visits on record review.  Most recent cardiology visit looks like they increased her Coreg.  She is concerned about her iron level although she is at her baseline for her anemia.  Troponin is negative, UA negative for infection.  Chest x-ray independently interpreted by me without focal infiltrate or pneumothorax.  Patient feeling bit better after IV fluids.  Will discharge home.  PCP and cardiology follow-up.  7:58 PM:  I have discussed the diagnosis/risks/treatment options with the patient.  Evaluation and diagnostic testing in the emergency department does not suggest an emergent condition requiring admission or immediate intervention beyond what has been performed at this time.  They will follow up with PCP, cards. We also discussed returning to the ED immediately if new or worsening sx occur. We discussed the sx which are most concerning (e.g., sudden worsening pain, fever, inability to tolerate by mouth) that necessitate immediate return. Medications administered to the patient during their visit and any new prescriptions provided to  the patient are listed below.  Medications given during this visit Medications  sodium chloride 0.9 % bolus 1,000 mL (0 mLs Intravenous Stopped 09/17/22  1924)     The patient appears reasonably screen and/or stabilized for discharge and I doubt any other medical condition or other [facility] requiring further screening, evaluation, or treatment in the ED at this time prior to discharge.          Final Clinical Impression(s) / ED Diagnoses Final diagnoses:  Diarrhea, unspecified type    Rx / DC Orders ED Discharge Orders          Ordered    promethazine (PHENERGAN) 25 MG tablet  Every 8 hours PRN        09/17/22 1949              Deno Etienne, DO 09/17/22 1958

## 2022-09-18 ENCOUNTER — Other Ambulatory Visit: Payer: Medicare Other

## 2022-09-18 ENCOUNTER — Ambulatory Visit: Payer: Medicare Other | Admitting: Hematology & Oncology

## 2022-09-21 DIAGNOSIS — I5042 Chronic combined systolic (congestive) and diastolic (congestive) heart failure: Secondary | ICD-10-CM | POA: Diagnosis not present

## 2022-09-21 DIAGNOSIS — C9000 Multiple myeloma not having achieved remission: Secondary | ICD-10-CM | POA: Diagnosis not present

## 2022-09-21 DIAGNOSIS — E782 Mixed hyperlipidemia: Secondary | ICD-10-CM | POA: Diagnosis not present

## 2022-09-21 DIAGNOSIS — I110 Hypertensive heart disease with heart failure: Secondary | ICD-10-CM | POA: Diagnosis not present

## 2022-09-21 DIAGNOSIS — Z95810 Presence of automatic (implantable) cardiac defibrillator: Secondary | ICD-10-CM | POA: Diagnosis not present

## 2022-09-21 DIAGNOSIS — Z8679 Personal history of other diseases of the circulatory system: Secondary | ICD-10-CM | POA: Diagnosis not present

## 2022-09-21 DIAGNOSIS — K529 Noninfective gastroenteritis and colitis, unspecified: Secondary | ICD-10-CM | POA: Diagnosis not present

## 2022-09-22 ENCOUNTER — Inpatient Hospital Stay (HOSPITAL_BASED_OUTPATIENT_CLINIC_OR_DEPARTMENT_OTHER): Payer: Medicare Other | Admitting: Medical Oncology

## 2022-09-22 ENCOUNTER — Other Ambulatory Visit: Payer: Self-pay

## 2022-09-22 ENCOUNTER — Ambulatory Visit: Payer: Medicare Other | Admitting: Medical

## 2022-09-22 ENCOUNTER — Encounter: Payer: Self-pay | Admitting: Medical Oncology

## 2022-09-22 ENCOUNTER — Inpatient Hospital Stay: Payer: Medicare Other | Attending: Hematology & Oncology

## 2022-09-22 VITALS — BP 123/86 | HR 94 | Temp 98.0°F | Resp 18 | Ht 66.0 in | Wt 190.1 lb

## 2022-09-22 DIAGNOSIS — I5041 Acute combined systolic (congestive) and diastolic (congestive) heart failure: Secondary | ICD-10-CM

## 2022-09-22 DIAGNOSIS — D500 Iron deficiency anemia secondary to blood loss (chronic): Secondary | ICD-10-CM | POA: Diagnosis not present

## 2022-09-22 DIAGNOSIS — R519 Headache, unspecified: Secondary | ICD-10-CM | POA: Diagnosis not present

## 2022-09-22 DIAGNOSIS — R5383 Other fatigue: Secondary | ICD-10-CM | POA: Diagnosis not present

## 2022-09-22 DIAGNOSIS — C9000 Multiple myeloma not having achieved remission: Secondary | ICD-10-CM | POA: Diagnosis not present

## 2022-09-22 DIAGNOSIS — R002 Palpitations: Secondary | ICD-10-CM

## 2022-09-22 DIAGNOSIS — Z950 Presence of cardiac pacemaker: Secondary | ICD-10-CM | POA: Diagnosis not present

## 2022-09-22 DIAGNOSIS — I493 Ventricular premature depolarization: Secondary | ICD-10-CM | POA: Diagnosis not present

## 2022-09-22 DIAGNOSIS — K529 Noninfective gastroenteritis and colitis, unspecified: Secondary | ICD-10-CM | POA: Diagnosis not present

## 2022-09-22 DIAGNOSIS — R001 Bradycardia, unspecified: Secondary | ICD-10-CM | POA: Diagnosis not present

## 2022-09-22 DIAGNOSIS — R197 Diarrhea, unspecified: Secondary | ICD-10-CM

## 2022-09-22 DIAGNOSIS — Z20822 Contact with and (suspected) exposure to covid-19: Secondary | ICD-10-CM | POA: Diagnosis not present

## 2022-09-22 DIAGNOSIS — R531 Weakness: Secondary | ICD-10-CM | POA: Diagnosis not present

## 2022-09-22 LAB — CBC WITH DIFFERENTIAL (CANCER CENTER ONLY)
Abs Immature Granulocytes: 0.01 10*3/uL (ref 0.00–0.07)
Basophils Absolute: 0 10*3/uL (ref 0.0–0.1)
Basophils Relative: 1 %
Eosinophils Absolute: 0.1 10*3/uL (ref 0.0–0.5)
Eosinophils Relative: 4 %
HCT: 38.9 % (ref 36.0–46.0)
Hemoglobin: 12.5 g/dL (ref 12.0–15.0)
Immature Granulocytes: 1 %
Lymphocytes Relative: 42 %
Lymphs Abs: 0.9 10*3/uL (ref 0.7–4.0)
MCH: 28.5 pg (ref 26.0–34.0)
MCHC: 32.1 g/dL (ref 30.0–36.0)
MCV: 88.8 fL (ref 80.0–100.0)
Monocytes Absolute: 0.2 10*3/uL (ref 0.1–1.0)
Monocytes Relative: 9 %
Neutro Abs: 0.9 10*3/uL — ABNORMAL LOW (ref 1.7–7.7)
Neutrophils Relative %: 43 %
Platelet Count: 128 10*3/uL — ABNORMAL LOW (ref 150–400)
RBC: 4.38 MIL/uL (ref 3.87–5.11)
RDW: 13.3 % (ref 11.5–15.5)
WBC Count: 2.1 10*3/uL — ABNORMAL LOW (ref 4.0–10.5)
nRBC: 0 % (ref 0.0–0.2)

## 2022-09-22 LAB — CMP (CANCER CENTER ONLY)
ALT: 10 U/L (ref 0–44)
AST: 22 U/L (ref 15–41)
Albumin: 3.9 g/dL (ref 3.5–5.0)
Alkaline Phosphatase: 52 U/L (ref 38–126)
Anion gap: 7 (ref 5–15)
BUN: 16 mg/dL (ref 8–23)
CO2: 28 mmol/L (ref 22–32)
Calcium: 10 mg/dL (ref 8.9–10.3)
Chloride: 102 mmol/L (ref 98–111)
Creatinine: 0.73 mg/dL (ref 0.44–1.00)
GFR, Estimated: >60 mL/min (ref >60)
Glucose, Bld: 142 mg/dL — ABNORMAL HIGH (ref 70–99)
Potassium: 3.8 mmol/L (ref 3.5–5.1)
Sodium: 137 mmol/L (ref 135–145)
Total Bilirubin: 0.4 mg/dL (ref 0.3–1.2)
Total Protein: 9.7 g/dL — ABNORMAL HIGH (ref 6.5–8.1)

## 2022-09-22 LAB — FERRITIN: Ferritin: 680 ng/mL — ABNORMAL HIGH (ref 11–307)

## 2022-09-22 LAB — IRON AND IRON BINDING CAPACITY (CC-WL,HP ONLY)
Iron: 71 ug/dL (ref 28–170)
Saturation Ratios: 21 % (ref 10.4–31.8)
TIBC: 344 ug/dL (ref 250–450)
UIBC: 273 ug/dL (ref 148–442)

## 2022-09-22 LAB — LACTATE DEHYDROGENASE: LDH: 200 U/L — ABNORMAL HIGH (ref 98–192)

## 2022-09-22 NOTE — Progress Notes (Signed)
Hematology and Oncology Follow Up Visit  ARIHANA AMBROCIO [id number] [birth year] 66 y.o. 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 Fuerst is here today for follow-up.    Yesterday she was seen by her Cardiologist. They adjusted many of her medications to help try to alleviate her chronic diarrhea and fatigue. They advised that she stop her PPIs, switched her from Carvedilol to  metoprolol, and switched her from magnesium oxide to magnesium glycinate to see if this helps with her diarrhea. She reports no changes but has only switched medications for less than 24 hours.   Has chronic diarrhea for which she is seen by GI. She has had extensive work up: CT/US/labs/Endo/colo. Feeling frustrated as overall she is not feeling great. Also working with her PCP regarding her fatigue.   Over the last saw her, her M spike was 1.8 g/dL.  Her IgG level was stable at 3017m/dL.  I think the Kappa light chain was also stable at 91.9 mg/L.  She has had no leg swelling.  She has had no rashes.  She has had no cough.   Overall, I would have to say that her performance status is probably ECOG 1.      Medications:  Allergies as of 09/22/2022       Reactions   Benazepril Anaphylaxis, Swelling, Hives   angioedema Throat and lip swelling   Ondansetron Hcl Hives   Redness and hives post IV admin on 07/05/17   Codeine Nausea And Vomiting   Morphine Hives   Redness and hives noted post IV admin on 07/05/17        Medication List        Accurate as of September 22, 2022 10:49 AM. If you have any questions, ask your nurse or  doctor.          STOP taking these medications    carvedilol 25 MG tablet Commonly known as: COREG Stopped by: SHughie Closs PA-C   Comirnaty syringe Generic drug: COVID-19 mRNA vaccine 2023-2024 Stopped by: SHughie Closs PA-C   magnesium oxide 400 MG tablet Commonly known as: MAG-OX Stopped by: SHughie Closs PA-C   pantoprazole 40 MG tablet Commonly known as: [place]: SHughie Closs PA-C   Shingrix injection Generic drug: Zoster Vaccine Adjuvanted Stopped by: SHughie Closs PA-C       TAKE these medications    acetaminophen 500 MG tablet Commonly known as: TYLENOL Take 500 mg by mouth in the morning.   acyclovir 400 MG tablet Commonly known as: ZOVIRAX TAKE ONE (1) TABLET BY MOUTH TWO (2) TIMES DAILY   albuterol 108 (90 Base) MCG/ACT inhaler Commonly known as: VENTOLIN HFA Inhale 2 puffs into the lungs every 6 (six) hours as needed for wheezing or shortness of breath.   aspirin EC 81 MG tablet Take 1 tablet (81 mg total) by mouth daily. What changed: when to take this   cetirizine 10 MG tablet Commonly known as: ZYRTEC Take 10 mg by mouth at bedtime.   colestipol 1 g tablet Commonly known as: COLESTID Take  2 tablets (2 g total) by mouth every 12 (twelve) hours.   dicyclomine 10 MG capsule Commonly known as: BENTYL Take 1 capsule (10 mg total) by mouth 4 (four) times daily -  before meals and at bedtime.   diphenoxylate-atropine 2.5-0.025 MG tablet Commonly known as: LOMOTIL Take 1 tablet by mouth 4 (four) times daily as needed for diarrhea or loose stools.   fluticasone 50 MCG/ACT nasal spray Commonly known as: FLONASE Place 2 sprays into both nostrils daily. What changed:  when to take this reasons to take this   furosemide 20 MG tablet Commonly known as: LASIX Take 20 mg by mouth daily as needed (fluid retention (feet swelling)).   hyoscyamine 0.125 MG SL tablet Commonly known as: LEVSIN SL Place 1  tablet (0.125 mg total) under the tongue every 4 (four) hours as needed.   loperamide 2 MG capsule Commonly known as: IMODIUM Take 1 capsule (2 mg total) by mouth 4 (four) times daily as needed for diarrhea or loose stools.   losartan 50 MG tablet Commonly known as: COZAAR Take 50 mg by mouth in the morning and at bedtime.   Magnesium Glycinate 100 MG Caps Take 400 mg by mouth at bedtime.   metoprolol succinate 50 MG 24 hr tablet Commonly known as: TOPROL-XL Take 1 tablet by mouth daily.   oxyCODONE-acetaminophen 5-325 MG tablet Commonly known as: PERCOCET/ROXICET Take 1 tablet by mouth every 6 (six) hours as needed for severe pain.   potassium chloride SA 20 MEQ tablet Commonly known as: KLOR-CON M Take 1 tablet (20 mEq total) by mouth daily.   PROBIOTIC PO Take 1 capsule by mouth every evening.   promethazine 25 MG tablet Commonly known as: PHENERGAN Take 1 tablet (25 mg total) by mouth every 8 (eight) hours as needed for up to 15 doses for nausea or vomiting.   spironolactone 25 MG tablet Commonly known as: ALDACTONE Take 12.5 mg by mouth every evening.   sucralfate 1 g tablet Commonly known as: Carafate Take 1 tablet (1 g total) by mouth 4 (four) times daily -  with meals and at bedtime.   Vitamin D3 50 MCG (2000 UT) Tabs Take 2,000 Units by mouth in the morning.        Allergies:  Allergies  Allergen Reactions   Benazepril Anaphylaxis, Swelling and Hives    angioedema Throat and lip swelling   Ondansetron Hcl Hives    Redness and hives post IV admin on 07/05/17   Codeine Nausea And Vomiting   Morphine Hives    Redness and hives noted post IV admin on 07/05/17    Past Medical History, Surgical history, Social history, and Family History were reviewed and updated.  Review of Systems: Review of Systems  Constitutional: Negative.   HENT: Negative.    Eyes: Negative.   Respiratory: Negative.    Cardiovascular: Negative.   Gastrointestinal:  Negative.   Genitourinary: Negative.   Musculoskeletal: Negative.   Skin: Negative.   Neurological: Negative.   Endo/Heme/Allergies: Negative.   Psychiatric/Behavioral: Negative.        Physical Exam:  height is _0  (1.676 m) and weight is 190 lb 1.3 oz (86.2 kg). Her oral temperature is 98 F (36.7 C). Her blood pressure is 123/86 and her pulse is 94. Her respiration is 18 and oxygen saturation is 100%.   Wt Readings from Last 3 Encounters:  09/22/22 190 lb 1.3 oz (86.2 kg)  09/17/22 190 lb (86.2 kg)  09/01/22 190 lb 0.6  oz (86.2 kg)    Physical Exam Vitals reviewed.  HENT:     Head: Normocephalic and atraumatic.  Eyes:     Pupils: Pupils are equal, round, and reactive to light.     Comments: Mild pallor of conjunctiva  Cardiovascular:     Rate and Rhythm: Normal rate and regular rhythm.     Heart sounds: Normal heart sounds.  Pulmonary:     Effort: Pulmonary effort is normal.     Breath sounds: Normal breath sounds.  Abdominal:     General: Bowel sounds are normal.     Palpations: Abdomen is soft.  Musculoskeletal:        General: No tenderness or deformity. Normal range of motion.     Cervical back: Normal range of motion.  Lymphadenopathy:     Cervical: No cervical adenopathy.  Skin:    General: Skin is warm and dry.     Findings: No erythema or rash.  Neurological:     Mental Status: She is alert and oriented to person, place, and time.  Psychiatric:        Behavior: Behavior normal.        Thought Content: Thought content normal.        Judgment: Judgment normal.      Lab Results  Component Value Date   WBC 2.1 (L) 09/22/2022   HGB 12.5 09/22/2022   HCT 38.9 09/22/2022   MCV 88.8 09/22/2022   PLT 128 (L) 09/22/2022   Lab Results  Component Value Date   FERRITIN 756 (H) 06/25/2022   IRON 74 06/25/2022   TIBC 329 06/25/2022   UIBC 255 06/25/2022   IRONPCTSAT 23 06/25/2022   Lab Results  Component Value Date   RETICCTPCT 0.8 07/16/2016    RBC 4.38 09/22/2022   RETICCTABS 32,640 07/16/2016   Lab Results  Component Value Date   KPAFRELGTCHN 91.9 (H) 08/03/2022   LAMBDASER 7.9 08/03/2022   KAPLAMBRATIO 16.10 (H) 08/05/2022   Lab Results  Component Value Date   IGGSERUM 3,069 (H) 08/03/2022   IGGSERUM 2,865 (H) 08/03/2022   IGA 147 08/03/2022   IGA 156 08/03/2022   IGMSERUM 133 08/03/2022   IGMSERUM 132 08/03/2022   Lab Results  Component Value Date   TOTALPROTELP 8.2 08/03/2022   ALBUMINELP 3.2 08/03/2022   A1GS 0.2 08/03/2022   A2GS 1.0 08/03/2022   BETS 1.1 08/03/2022   BETA2SER 0.5 07/16/2016   GAMS 2.6 (H) 08/03/2022   MSPIKE 1.8 (H) 08/03/2022   SPEI Comment 05/01/2022     Chemistry      Component Value Date/Time   NA 137 09/22/2022 0935   NA 143 11/08/2017 1127   NA 140 12/31/2016 1000   K 3.8 09/22/2022 0935   K 4.0 11/08/2017 1127   K 3.6 12/31/2016 1000   CL 102 09/22/2022 0935   CL 107 11/08/2017 1127   CO2 28 09/22/2022 0935   CO2 25 11/08/2017 1127   CO2 24 12/31/2016 1000   BUN 16 09/22/2022 0935   BUN 19 11/08/2017 1127   BUN 22.3 12/31/2016 1000   CREATININE 0.73 09/22/2022 0935   CREATININE 0.9 11/08/2017 1127   CREATININE 0.8 12/31/2016 1000      Component Value Date/Time   CALCIUM 10.0 09/22/2022 0935   CALCIUM 9.9 11/08/2017 1127   CALCIUM 9.5 12/31/2016 1000   ALKPHOS 52 09/22/2022 0935   ALKPHOS 44 11/08/2017 1127   ALKPHOS 51 12/31/2016 1000   AST 22 09/22/2022 0935   AST  28 12/31/2016 1000   ALT 10 09/22/2022 0935   ALT 36 11/08/2017 1127   ALT 16 12/31/2016 1000   BILITOT 0.4 09/22/2022 0935   BILITOT 0.46 12/31/2016 1000      Encounter Diagnoses  Name Primary?   Iron deficiency anemia due to chronic blood loss    Diarrhea, unspecified type    Multiple myeloma, remission status unspecified (HCC) Yes    Impression and Plan: Ms. Mateus is a very pleasant 66 yo [place] American female with a previous IgG kappa MGUS with progression to myeloma.   Previously  her myeloma was not thought to be a contributor to her symptoms of diarrhea and fatigue. I agree. I support the medication changes being made by Cardiology. They should help with her diarrhea. Alerted patient that it may take 2-4 weeks for her to see improvement. If this is not helpful I would suggest an elimination diet. Recommended she continue working with Cardiology, Endocrinology, PCP.  Her myeloma labs are pending today. I have added on an Iron and ferritin level per pt request.   RTC 1 month MD, labs   Hughie Closs, PA-C 11/14/202310:49 AM

## 2022-09-23 LAB — KAPPA/LAMBDA LIGHT CHAINS
Kappa free light chain: 100.8 mg/L — ABNORMAL HIGH (ref 3.3–19.4)
Kappa, lambda light chain ratio: 10.5 — ABNORMAL HIGH (ref 0.26–1.65)
Lambda free light chains: 9.6 mg/L (ref 5.7–26.3)

## 2022-09-24 ENCOUNTER — Telehealth: Payer: Self-pay

## 2022-09-24 LAB — IGG, IGA, IGM
IgA: 183 mg/dL (ref 87–352)
IgG (Immunoglobin G), Serum: 3460 mg/dL — ABNORMAL HIGH (ref 586–1602)
IgM (Immunoglobulin M), Srm: 154 mg/dL (ref 26–217)

## 2022-09-24 NOTE — Telephone Encounter (Signed)
Patient called inquiring about lab results from 11/14. MD reviewed and informed pt per MD they are slightly elevated follow up as scheduled. Pt confirmed and declined any other questions or concerns at this time

## 2022-09-30 LAB — IMMUNOFIXATION REFLEX, SERUM
IgA: 187 mg/dL (ref 87–352)
IgG (Immunoglobin G), Serum: 3472 mg/dL — ABNORMAL HIGH (ref 586–1602)
IgM (Immunoglobulin M), Srm: 169 mg/dL (ref 26–217)

## 2022-09-30 LAB — PROTEIN ELECTROPHORESIS, SERUM, WITH REFLEX
A/G Ratio: 0.6 — ABNORMAL LOW (ref 0.7–1.7)
Albumin ELP: 3.4 g/dL (ref 2.9–4.4)
Alpha-1-Globulin: 0.3 g/dL (ref 0.0–0.4)
Alpha-2-Globulin: 1.1 g/dL — ABNORMAL HIGH (ref 0.4–1.0)
Beta Globulin: 1.2 g/dL (ref 0.7–1.3)
Gamma Globulin: 2.9 g/dL — ABNORMAL HIGH (ref 0.4–1.8)
Globulin, Total: 5.5 g/dL — ABNORMAL HIGH (ref 2.2–3.9)
M-Spike, %: 1.9 g/dL — ABNORMAL HIGH
SPEP Interpretation: 0
Total Protein ELP: 8.9 g/dL — ABNORMAL HIGH (ref 6.0–8.5)

## 2022-10-07 ENCOUNTER — Other Ambulatory Visit: Payer: Self-pay | Admitting: Gastroenterology

## 2022-10-13 ENCOUNTER — Ambulatory Visit (INDEPENDENT_AMBULATORY_CARE_PROVIDER_SITE_OTHER): Payer: Medicare Other | Admitting: Family Medicine

## 2022-10-13 VITALS — BP 128/80 | HR 104 | Temp 98.0°F | Resp 16 | Ht 66.0 in | Wt 191.0 lb

## 2022-10-13 DIAGNOSIS — R739 Hyperglycemia, unspecified: Secondary | ICD-10-CM | POA: Diagnosis not present

## 2022-10-13 DIAGNOSIS — R197 Diarrhea, unspecified: Secondary | ICD-10-CM | POA: Diagnosis not present

## 2022-10-13 DIAGNOSIS — I10 Essential (primary) hypertension: Secondary | ICD-10-CM | POA: Diagnosis not present

## 2022-10-13 DIAGNOSIS — R350 Frequency of micturition: Secondary | ICD-10-CM | POA: Diagnosis not present

## 2022-10-13 DIAGNOSIS — I517 Cardiomegaly: Secondary | ICD-10-CM | POA: Diagnosis not present

## 2022-10-13 DIAGNOSIS — I5042 Chronic combined systolic (congestive) and diastolic (congestive) heart failure: Secondary | ICD-10-CM | POA: Diagnosis not present

## 2022-10-13 DIAGNOSIS — E860 Dehydration: Secondary | ICD-10-CM | POA: Diagnosis not present

## 2022-10-13 DIAGNOSIS — R9439 Abnormal result of other cardiovascular function study: Secondary | ICD-10-CM | POA: Diagnosis not present

## 2022-10-13 DIAGNOSIS — Z95810 Presence of automatic (implantable) cardiac defibrillator: Secondary | ICD-10-CM | POA: Diagnosis not present

## 2022-10-13 MED ORDER — COLESTIPOL HCL 1 G PO TABS: 3.0000 g | ORAL_TABLET | Freq: Two times a day (BID) | ORAL | 3 refills | Status: DC

## 2022-10-13 MED ORDER — DICYCLOMINE HCL 10 MG PO CAPS: 10.0000 mg | ORAL_CAPSULE | Freq: Three times a day (TID) | ORAL | 2 refills | Status: DC

## 2022-10-13 NOTE — Progress Notes (Signed)
Subjective:   By signing my name below, I, Tami [name], attest that this documentation has been prepared under the direction and in the presence of Tami Lukes, MD., 10/13/2022.   [name], female    DOB: 30-Apr-1956, 66 y.o.   MRN: [id number]  No chief complaint on file.  HPI Patient is in today for an office visit.  Cardiology Patient had an echocardiogram performed earlier today by Adella Hare, PA, at [facility].   Endoscopy Patient recently underwent an upper GI endoscopy on 09/01/2022 under Dr. Lyndel Safe.  She has been taking Dicyclomine 10 mg, Colestipol HCl 1g, and Potassium Chloride 20 mEq since the procedure. However, she complains of recurrent diarrhea and perianal irritation. She further complains of frequency and urgency to urinate, but experiences hesitation when attempting to empty the bladder. Denies CP/ palp/ SOB/ congestion/fevers.   Headaches Patient complains of intermittent headaches that occur upon waking up in the morning or midday. She reports having 3 headaches weekly.  Ibuprofen 600 mg has been tolerable but Tylenol extra strength has not provided any relief.   Past Medical History:  Diagnosis Date   Abnormal SPEP 07/26/2016   Arthritis    knees, hands   Back pain 07/14/2016   Bowel obstruction ([place]) 11/2014   C. 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   Iron deficiency anemia due to chronic blood loss 05/28/2022   Low back pain 07/14/2016   Monoclonal gammopathy of unknown significance (MGUS) 09/16/2016   Multiple myeloma ([place]) 07/20/2018   Multiple myeloma not having achieved remission ([place]) 07/20/2018    Myalgia 12/31/2016   Obstructive sleep apnea 03/31/2015   Presence of permanent cardiac pacemaker    SOB (shortness of breath) 04/09/2016   Stroke ([place])    TIAs   UTI (urinary tract infection)    Past Surgical History:  Procedure Laterality Date   ABDOMINAL HYSTERECTOMY     menorraghia, 2006, total   BIOPSY  09/01/2022   Procedure: BIOPSY;  Surgeon: Jackquline Denmark, MD;  Location: [facility];  Service: Gastroenterology;;   CHOLECYSTECTOMY     COLONOSCOPY  2018   [facility] per patient   COLONOSCOPY WITH PROPOFOL N/A 09/01/2022   Procedure: COLONOSCOPY WITH PROPOFOL;  Surgeon: Jackquline Denmark, MD;  Location: [facility];  Service: Gastroenterology;  Laterality: N/A;   ESOPHAGOGASTRODUODENOSCOPY  2018   [facility]    ESOPHAGOGASTRODUODENOSCOPY (EGD) WITH PROPOFOL N/A 09/01/2022   Procedure: ESOPHAGOGASTRODUODENOSCOPY (EGD) WITH PROPOFOL;  Surgeon: Jackquline Denmark, MD;  Location: [facility];  Service: Gastroenterology;  Laterality: N/A;   KNEE SURGERY     right, repair torn torn cartialage   PACEMAKER INSERTION  08/2014   TONSILLECTOMY     TUBAL LIGATION     Family History  Problem Relation Age of Onset   Diabetes Mother    Hypertension Mother    Heart disease Mother        s/p 1 stent   Hyperlipidemia Mother    Arthritis Mother    Cancer Father        COLON   Colon cancer Father 31   Irritable bowel syndrome Sister  Hyperlipidemia Daughter    Hypertension Daughter    Hypertension Maternal Grandmother    Arthritis Maternal Grandmother    Heart disease Maternal Grandfather        MI   Hypertension Maternal Grandfather    Arthritis Maternal Grandfather    Hypertension Son    Esophageal cancer Neg Hx    Social History   Socioeconomic History   Marital status: Divorced    Spouse name: [name]   Number of children: 3   Years of education: [name]   Highest education level: High school graduate  Occupational History   [name]   Tobacco Use   Smoking status: Never   Smokeless tobacco: Never  Vaping Use   Vaping Use: Never used  Substance and Sexual Activity   Alcohol use: No   Drug use: No   Sexual activity: [name]  Other Topics Concern   [name]  Social History Narrative   Lives at home alone   Retired   Caffeine: coffee   Social Determinants of Health   Financial Resource Strain: Low Risk  (10/17/2021)   Overall Financial Resource Strain (CARDIA)    Difficulty of Paying Living Expenses: Not hard at all  Food Insecurity: No Food Insecurity (10/17/2021)   Hunger Vital Sign    Worried About Running Out of Food in the Last Year: Never true    [place] in the Last Year: Never true  Transportation Needs: No Transportation Needs (10/17/2021)   PRAPARE - Hydrologist (Medical): No    Lack of Transportation (Non-Medical): No  Physical Activity: Insufficiently Active (10/17/2021)   Exercise Vital Sign    Days of Exercise per Week: 2 days    Minutes of Exercise per Session: 30 min  Stress: No Stress Concern Present (10/17/2021)   [place]    Feeling of Stress : Not at all  Social Connections: Moderately Isolated (10/17/2021)   Social Connection and Isolation Panel [NHANES]    Frequency of Communication with Friends and Family: More than three times a week    Frequency of Social Gatherings with Friends and Family: More than three times a week    Attends Religious Services: More than 4 times per year    Active Member of [employer] or Organizations: No    Attends Archivist Meetings: Never    Marital Status: Divorced  Human resources officer Violence: Not At Risk (10/17/2021)   Humiliation, Afraid, Rape, and Kick questionnaire    Fear of Current or Ex-Partner: No    Emotionally Abused: No    Physically Abused: No    Sexually Abused: No   Outpatient Medications Prior to Visit  Medication Sig  Dispense Refill   acetaminophen (TYLENOL) 500 MG tablet Take 500 mg by mouth in the morning.     acyclovir (ZOVIRAX) 400 MG tablet TAKE ONE (1) TABLET BY MOUTH TWO (2) TIMES DAILY 60 tablet 2   albuterol (VENTOLIN HFA) 108 (90 Base) MCG/ACT inhaler Inhale 2 puffs into the lungs every 6 (six) hours as needed for wheezing or shortness of breath. 18 g 5   aspirin EC 81 MG tablet Take 1 tablet (81 mg total) by mouth daily. (Patient taking differently: Take 81 mg by mouth every evening.) 90 tablet 1   cetirizine (ZYRTEC) 10 MG tablet Take 10 mg by mouth at bedtime.     Cholecalciferol (VITAMIN D3) 50  MCG (2000 UT) TABS Take 2,000 Units by mouth in the morning.     colestipol (COLESTID) 1 g tablet Take 2 tablets (2 g total) by mouth every 12 (twelve) hours. 120 tablet 1   dicyclomine (BENTYL) 10 MG capsule Take 1 capsule (10 mg total) by mouth 4 (four) times daily -  before meals and at bedtime. 120 capsule 0   diphenoxylate-atropine (LOMOTIL) 2.5-0.025 MG tablet Take 1 tablet by mouth 4 (four) times daily as needed for diarrhea or loose stools. 100 tablet 0   fluticasone (FLONASE) 50 MCG/ACT nasal spray Place 2 sprays into both nostrils daily. (Patient taking differently: Place 2 sprays into both nostrils daily as needed for allergies.) 16 g 0   furosemide (LASIX) 20 MG tablet Take 20 mg by mouth daily as needed (fluid retention (feet swelling)).     hyoscyamine (LEVSIN SL) 0.125 MG SL tablet Place 1 tablet (0.125 mg total) under the tongue every 4 (four) hours as needed. 30 tablet 1   loperamide (IMODIUM) 2 MG capsule Take 1 capsule (2 mg total) by mouth 4 (four) times daily as needed for diarrhea or loose stools. 12 capsule 0   losartan (COZAAR) 50 MG tablet Take 50 mg by mouth in the morning and at bedtime.     Magnesium Glycinate 100 MG CAPS Take 400 mg by mouth at bedtime.     metoprolol succinate (TOPROL-XL) 50 MG 24 hr tablet Take 1 tablet by mouth daily.     oxyCODONE-acetaminophen  (PERCOCET/ROXICET) 5-325 MG tablet Take 1 tablet by mouth every 6 (six) hours as needed for severe pain. 30 tablet 0   potassium chloride SA (KLOR-CON M) 20 MEQ tablet Take 1 tablet (20 mEq total) by mouth daily. 30 tablet 2   Probiotic Product (PROBIOTIC PO) Take 1 capsule by mouth every evening.     promethazine (PHENERGAN) 25 MG tablet Take 1 tablet (25 mg total) by mouth every 8 (eight) hours as needed for up to 15 doses for nausea or vomiting. 15 tablet 0   spironolactone (ALDACTONE) 25 MG tablet Take 12.5 mg by mouth every evening.     sucralfate (CARAFATE) 1 g tablet Take 1 tablet (1 g total) by mouth 4 (four) times daily -  with meals and at bedtime. 120 tablet 2   No facility-administered medications prior to visit.   Allergies  Allergen Reactions   Benazepril Anaphylaxis, Swelling and Hives    angioedema Throat and lip swelling   Ondansetron Hcl Hives    Redness and hives post IV admin on 07/05/17   Codeine Nausea And Vomiting   Morphine Hives    Redness and hives noted post IV admin on 07/05/17   Review of Systems  Constitutional:  Negative for chills and fever.  HENT:  Negative for congestion.   Respiratory:  Negative for shortness of breath.   Cardiovascular:  Negative for chest pain and palpitations.  Gastrointestinal:  Negative for abdominal pain, blood in stool, constipation, diarrhea, nausea and vomiting.  Genitourinary:  Positive for frequency and urgency. Negative for dysuria and hematuria.  Skin:           Neurological:  Positive for headaches.      Objective:    Physical Exam Constitutional:      General: She is not in acute distress.    Appearance: Normal appearance. She is normal weight. She is not ill-appearing.  HENT:     Head: Normocephalic and atraumatic.     Right Ear: External ear  normal.     Left Ear: External ear normal.     Nose: Nose normal.     Mouth/Throat:     Mouth: Mucous membranes are moist.     Pharynx: Oropharynx is clear.  Eyes:      General:        Right eye: No discharge.        Left eye: No discharge.     Extraocular Movements: Extraocular movements intact.     Conjunctiva/sclera: Conjunctivae normal.     Pupils: Pupils are equal, round, and reactive to light.  Cardiovascular:     Rate and Rhythm: Normal rate and regular rhythm.     Pulses: Normal pulses.     Heart sounds: Normal heart sounds. No murmur heard.    No gallop.  Pulmonary:     Effort: Pulmonary effort is normal. No respiratory distress.     Breath sounds: Normal breath sounds. No wheezing or rales.  Abdominal:     General: Bowel sounds are normal.     Palpations: Abdomen is soft.     Tenderness: There is no abdominal tenderness. There is no guarding.     Comments: Lower abdominal tenderness.  Musculoskeletal:        General: Normal range of motion.     Cervical back: Normal range of motion.     Right lower leg: No edema.     Left lower leg: No edema.  Skin:    General: Skin is warm and dry.     Comments: Mild perianal irritation.   Neurological:     Mental Status: She is alert and oriented to person, place, and time.  Psychiatric:        Mood and Affect: Mood normal.        Behavior: Behavior normal.        Judgment: Judgment normal.    There were no vitals taken for this visit. Wt Readings from Last 3 Encounters:  09/22/22 190 lb 1.3 oz (86.2 kg)  09/17/22 190 lb (86.2 kg)  09/01/22 190 lb 0.6 oz (86.2 kg)   Diabetic Foot Exam - Simple   No data filed    Lab Results  Component Value Date   WBC 2.1 (L) 09/22/2022   HGB 12.5 09/22/2022   HCT 38.9 09/22/2022   PLT 128 (L) 09/22/2022   GLUCOSE 142 (H) 09/22/2022   CHOL 171 04/21/2022   TRIG 105.0 04/21/2022   HDL 49.40 04/21/2022   LDLCALC 100 (H) 04/21/2022   ALT 10 09/22/2022   AST 22 09/22/2022   NA 137 09/22/2022   K 3.8 09/22/2022   CL 102 09/22/2022   CREATININE 0.73 09/22/2022   BUN 16 09/22/2022   CO2 28 09/22/2022   TSH 1.97 04/21/2022   INR 0.90  07/28/2018   HGBA1C 5.8 04/21/2022   Lab Results  Component Value Date   TSH 1.97 04/21/2022   Lab Results  Component Value Date   WBC 2.1 (L) 09/22/2022   HGB 12.5 09/22/2022   HCT 38.9 09/22/2022   MCV 88.8 09/22/2022   PLT 128 (L) 09/22/2022   Lab Results  Component Value Date   NA 137 09/22/2022   K 3.8 09/22/2022   CHLORIDE 108 12/31/2016   CO2 28 09/22/2022   GLUCOSE 142 (H) 09/22/2022   BUN 16 09/22/2022   CREATININE 0.73 09/22/2022   BILITOT 0.4 09/22/2022   ALKPHOS 52 09/22/2022   AST 22 09/22/2022   ALT 10 09/22/2022   PROT 9.7 (H)  09/22/2022   ALBUMIN 3.9 09/22/2022   CALCIUM 10.0 09/22/2022   ANIONGAP 7 09/22/2022   EGFR >90 12/31/2016   GFR 94.30 08/25/2022   Lab Results  Component Value Date   CHOL 171 04/21/2022   Lab Results  Component Value Date   HDL 49.40 04/21/2022   Lab Results  Component Value Date   LDLCALC 100 (H) 04/21/2022   Lab Results  Component Value Date   TRIG 105.0 04/21/2022   Lab Results  Component Value Date   CHOLHDL 3 04/21/2022   Lab Results  Component Value Date   HGBA1C 5.8 04/21/2022      Assessment & Plan:   Labs Routine lab work will be completed today.  Refills Patient's Colestipol HCl 1g has been refilled and she has been told to take 3-4 tablets twice daily. Dicyclomine 10 mg has also been refilled.  Problem List Items Addressed This Visit   None  No orders of the defined types were placed in this encounter.  I, Tami [name], personally preformed the services described in this documentation.  All medical record entries made by the scribe were at my direction and in my presence.  I have reviewed the chart and discharge instructions (if applicable) and agree that the record reflects my personal performance and is accurate and complete. 10/13/2022  I,Mohammed Iqbal,acting as a scribe for Penni Homans, MD.,have documented all relevant documentation on the behalf of Penni Homans, MD,as directed by   Penni Homans, MD while in the presence of Penni Homans, MD.  Tami [name]

## 2022-10-13 NOTE — Patient Instructions (Signed)
Excedrin Tension or Ibuprofen 400 mg as needed for headache  General Headache Without Cause A headache is pain or discomfort felt around the head or neck area. There are many causes and types of headaches. A few common types include: Tension headaches. Migraine headaches. Cluster headaches. Chronic daily headaches. Sometimes, the specific cause of a headache may not be found. Follow these instructions at home: Watch your condition for any changes. Let your health care provider know about them. Take these steps to help with your condition: Managing pain     Take over-the-counter and prescription medicines only as told by your health care provider. Treatment may include medicines for pain that are taken by mouth or applied to the skin. Lie down in a dark, quiet room when you have a headache. Keep lights dim if bright lights bother you or make your headaches worse. If directed, put ice on your head and neck area: Put ice in a plastic bag. Place a towel between your skin and the bag. Leave the ice on for 20 minutes, 2-3 times per day. Remove the ice if your skin turns bright red. This is very important. If you cannot feel pain, heat, or cold, you have a greater risk of damage to the area. If directed, apply heat to the affected area. Use the heat source that your health care provider recommends, such as a moist heat pack or a heating pad. Place a towel between your skin and the heat source. Leave the heat on for 20-30 minutes. Remove the heat if your skin turns bright red. This is especially important if you are unable to feel pain, heat, or cold. You have a greater risk of getting burned. Eating and drinking Eat meals on a regular schedule. If you drink alcohol: Limit how much you have to: 0-1 drink a day for women who are not pregnant. 0-2 drinks a day for men. Know how much alcohol is in a drink. In the [place] one drink equals one 12 oz bottle of beer (355 mL), one 5 oz glass of wine  (148 mL), or one 1 oz glass of hard liquor (44 mL). Stop drinking caffeine, or decrease the amount of caffeine you drink. Drink enough fluid to keep your urine pale yellow. General instructions  Keep a headache journal to help find out what may trigger your headaches. For example, write down: What you eat and drink. How much sleep you get. Any change to your diet or medicines. Try massage or other relaxation techniques. Limit stress. Sit up straight, and do not tense your muscles. Do not use any products that contain nicotine or tobacco. These products include cigarettes, chewing tobacco, and vaping devices, such as e-cigarettes. If you need help quitting, ask your health care provider. Exercise regularly as told by your health care provider. Sleep on a regular schedule. Get 7-9 hours of sleep each night, or the amount recommended by your health care provider. Keep all follow-up visits. This is important. Contact a health care provider if: Medicine does not help your symptoms. You have a headache that is different from your usual headache. You have nausea or you vomit. You have a fever. Get help right away if: Your headache: Becomes severe quickly. Gets worse after moderate to intense physical activity. You have any of these symptoms: Repeated vomiting. Pain or stiffness in your neck. Changes to your vision. Pain in an eye or ear. Problems with speech. Muscular weakness or loss of muscle control. Loss of balance or coordination.  You feel faint or pass out. You have confusion. You have a seizure. These symptoms may represent a serious problem that is an emergency. Do not wait to see if the symptoms will go away. Get medical help right away. Call your local emergency services (911 in the [place] Do not drive yourself to the hospital. Summary A headache is pain or discomfort felt around the head or neck area. There are many causes and types of headaches. In some cases, the cause  may not be found. Keep a headache journal to help find out what may trigger your headaches. Watch your condition for any changes. Let your health care provider know about them. Contact a health care provider if you have a headache that is different from the usual headache, or if your symptoms are not helped by medicine. Get help right away if your headache becomes severe, you vomit, you have a loss of vision, you lose your balance, or you have a seizure. This information is not intended to replace advice given to you by your health care provider. Make sure you discuss any questions you have with your health care provider. Document Revised: 03/26/2021 Document Reviewed: 03/26/2021 Elsevier Patient Education  [place].

## 2022-10-14 ENCOUNTER — Other Ambulatory Visit: Payer: Medicare Other

## 2022-10-14 LAB — POC URINALSYSI DIPSTICK (AUTOMATED)
Bilirubin, UA: NEGATIVE
Blood, UA: NEGATIVE
Glucose, UA: NEGATIVE
Ketones, UA: NEGATIVE
Nitrite, UA: POSITIVE
Protein, UA: NEGATIVE
Spec Grav, UA: 1.025 (ref 1.010–1.025)
Urobilinogen, UA: 0.2 E.U./dL
pH, UA: 6 (ref 5.0–8.0)

## 2022-10-14 LAB — COMPREHENSIVE METABOLIC PANEL
ALT: 11 U/L (ref 0–35)
AST: 27 U/L (ref 0–37)
Albumin: 3.6 g/dL (ref 3.5–5.2)
Alkaline Phosphatase: 49 U/L (ref 39–117)
BUN: 15 mg/dL (ref 6–23)
CO2: 26 mEq/L (ref 19–32)
Calcium: 8.9 mg/dL (ref 8.4–10.5)
Chloride: 104 mEq/L (ref 96–112)
Creatinine, Ser: 0.67 mg/dL (ref 0.40–1.20)
GFR: 89.84 mL/min (ref >60.00)
Glucose, Bld: 93 mg/dL (ref 70–99)
Potassium: 4.9 mEq/L (ref 3.5–5.1)
Sodium: 136 mEq/L (ref 135–145)
Total Bilirubin: 0.4 mg/dL (ref 0.2–1.2)
Total Protein: 8.7 g/dL — ABNORMAL HIGH (ref 6.0–8.3)

## 2022-10-14 LAB — CBC WITH DIFFERENTIAL/PLATELET
Basophils Absolute: 0 10*3/uL (ref 0.0–0.1)
Basophils Relative: 1.1 % (ref 0.0–3.0)
Eosinophils Absolute: 0.1 10*3/uL (ref 0.0–0.7)
Eosinophils Relative: 3.1 % (ref 0.0–5.0)
HCT: 36.7 % (ref 36.0–46.0)
Hemoglobin: 12.2 g/dL (ref 12.0–15.0)
Lymphocytes Relative: 44.4 % (ref 12.0–46.0)
Lymphs Abs: 1.1 10*3/uL (ref 0.7–4.0)
MCHC: 33.3 g/dL (ref 30.0–36.0)
MCV: 88.1 fl (ref 78.0–100.0)
Monocytes Absolute: 0.2 10*3/uL (ref 0.1–1.0)
Monocytes Relative: 9.7 % (ref 3.0–12.0)
Neutro Abs: 1 10*3/uL — ABNORMAL LOW (ref 1.4–7.7)
Neutrophils Relative %: 41.7 % — ABNORMAL LOW (ref 43.0–77.0)
Platelets: 127 10*3/uL — ABNORMAL LOW (ref 150.0–400.0)
RBC: 4.17 Mil/uL (ref 3.87–5.11)
RDW: 15.3 % (ref 11.5–15.5)
WBC: 2.5 10*3/uL — ABNORMAL LOW (ref 4.0–10.5)

## 2022-10-14 LAB — MAGNESIUM: Magnesium: 1.8 mg/dL (ref 1.5–2.5)

## 2022-10-14 NOTE — Assessment & Plan Note (Signed)
hgba1c acceptable, minimize simple carbs. Increase exercise as tolerated.  

## 2022-10-14 NOTE — Assessment & Plan Note (Signed)
Well controlled, no changes to meds. Encouraged heart healthy diet such as the DASH diet and exercise as tolerated.  °

## 2022-10-14 NOTE — Assessment & Plan Note (Signed)
Has not had CDiff for a good while she is still having several loose stool daily but no bloody or watery stool. She may use Imodium 2 mg prn when she has to go out and see if that helps

## 2022-10-14 NOTE — Assessment & Plan Note (Signed)
Check UA and culture 

## 2022-10-14 NOTE — Assessment & Plan Note (Signed)
Is euvolemic today

## 2022-10-16 LAB — URINE CULTURE
MICRO NUMBER:: 14278410
Result:: NO GROWTH
SPECIMEN QUALITY:: ADEQUATE

## 2022-10-17 DIAGNOSIS — R1013 Epigastric pain: Secondary | ICD-10-CM | POA: Diagnosis not present

## 2022-10-17 DIAGNOSIS — R519 Headache, unspecified: Secondary | ICD-10-CM | POA: Diagnosis not present

## 2022-10-17 DIAGNOSIS — R197 Diarrhea, unspecified: Secondary | ICD-10-CM | POA: Diagnosis not present

## 2022-10-17 DIAGNOSIS — R110 Nausea: Secondary | ICD-10-CM | POA: Diagnosis not present

## 2022-10-17 DIAGNOSIS — E860 Dehydration: Secondary | ICD-10-CM | POA: Diagnosis not present

## 2022-10-17 DIAGNOSIS — R531 Weakness: Secondary | ICD-10-CM | POA: Diagnosis not present

## 2022-10-18 DIAGNOSIS — R0989 Other specified symptoms and signs involving the circulatory and respiratory systems: Secondary | ICD-10-CM | POA: Diagnosis not present

## 2022-10-18 DIAGNOSIS — I719 Aortic aneurysm of unspecified site, without rupture: Secondary | ICD-10-CM | POA: Diagnosis not present

## 2022-10-18 DIAGNOSIS — I429 Cardiomyopathy, unspecified: Secondary | ICD-10-CM | POA: Diagnosis not present

## 2022-10-18 DIAGNOSIS — C9000 Multiple myeloma not having achieved remission: Secondary | ICD-10-CM | POA: Diagnosis not present

## 2022-10-18 DIAGNOSIS — E785 Hyperlipidemia, unspecified: Secondary | ICD-10-CM | POA: Diagnosis not present

## 2022-10-18 DIAGNOSIS — N183 Chronic kidney disease, stage 3 unspecified: Secondary | ICD-10-CM | POA: Diagnosis not present

## 2022-10-18 DIAGNOSIS — K529 Noninfective gastroenteritis and colitis, unspecified: Secondary | ICD-10-CM | POA: Diagnosis not present

## 2022-10-18 DIAGNOSIS — I509 Heart failure, unspecified: Secondary | ICD-10-CM | POA: Diagnosis not present

## 2022-10-18 DIAGNOSIS — R7309 Other abnormal glucose: Secondary | ICD-10-CM | POA: Diagnosis not present

## 2022-10-18 DIAGNOSIS — G43909 Migraine, unspecified, not intractable, without status migrainosus: Secondary | ICD-10-CM | POA: Diagnosis not present

## 2022-10-18 DIAGNOSIS — Z79899 Other long term (current) drug therapy: Secondary | ICD-10-CM | POA: Diagnosis not present

## 2022-10-20 ENCOUNTER — Ambulatory Visit: Payer: Medicare Other | Admitting: Family Medicine

## 2022-10-20 DIAGNOSIS — G4733 Obstructive sleep apnea (adult) (pediatric): Secondary | ICD-10-CM | POA: Diagnosis not present

## 2022-10-23 ENCOUNTER — Ambulatory Visit: Payer: Medicare Other

## 2022-10-23 ENCOUNTER — Ambulatory Visit: Payer: Medicare Other | Admitting: Medical

## 2022-10-23 DIAGNOSIS — N390 Urinary tract infection, site not specified: Secondary | ICD-10-CM | POA: Diagnosis not present

## 2022-10-23 DIAGNOSIS — A499 Bacterial infection, unspecified: Secondary | ICD-10-CM | POA: Diagnosis not present

## 2022-10-27 ENCOUNTER — Inpatient Hospital Stay (HOSPITAL_BASED_OUTPATIENT_CLINIC_OR_DEPARTMENT_OTHER): Payer: Medicare Other | Admitting: Hematology & Oncology

## 2022-10-27 ENCOUNTER — Inpatient Hospital Stay: Payer: Medicare Other | Attending: Hematology & Oncology

## 2022-10-27 ENCOUNTER — Encounter: Payer: Self-pay | Admitting: Hematology & Oncology

## 2022-10-27 ENCOUNTER — Other Ambulatory Visit: Payer: Self-pay

## 2022-10-27 VITALS — BP 163/91 | HR 90 | Temp 97.8°F | Resp 18 | Ht 66.0 in | Wt 192.0 lb

## 2022-10-27 DIAGNOSIS — C9000 Multiple myeloma not having achieved remission: Secondary | ICD-10-CM

## 2022-10-27 DIAGNOSIS — D801 Nonfamilial hypogammaglobulinemia: Secondary | ICD-10-CM

## 2022-10-27 LAB — CMP (CANCER CENTER ONLY)
ALT: 12 U/L (ref 0–44)
AST: 29 U/L (ref 15–41)
Albumin: 3.8 g/dL (ref 3.5–5.0)
Alkaline Phosphatase: 46 U/L (ref 38–126)
Anion gap: 8 (ref 5–15)
BUN: 20 mg/dL (ref 8–23)
CO2: 25 mmol/L (ref 22–32)
Calcium: 8.9 mg/dL (ref 8.9–10.3)
Chloride: 106 mmol/L (ref 98–111)
Creatinine: 0.74 mg/dL (ref 0.44–1.00)
GFR, Estimated: >60 mL/min (ref >60)
Glucose, Bld: 97 mg/dL (ref 70–99)
Potassium: 4.4 mmol/L (ref 3.5–5.1)
Sodium: 139 mmol/L (ref 135–145)
Total Bilirubin: 0.4 mg/dL (ref 0.3–1.2)
Total Protein: 8.6 g/dL — ABNORMAL HIGH (ref 6.5–8.1)

## 2022-10-27 LAB — CBC WITH DIFFERENTIAL/PLATELET
Abs Immature Granulocytes: 0.01 10*3/uL (ref 0.00–0.07)
Basophils Absolute: 0 10*3/uL (ref 0.0–0.1)
Basophils Relative: 1 %
Eosinophils Absolute: 0.1 10*3/uL (ref 0.0–0.5)
Eosinophils Relative: 2 %
HCT: 36.5 % (ref 36.0–46.0)
Hemoglobin: 11.8 g/dL — ABNORMAL LOW (ref 12.0–15.0)
Immature Granulocytes: 0 %
Lymphocytes Relative: 36 %
Lymphs Abs: 1 10*3/uL (ref 0.7–4.0)
MCH: 28.5 pg (ref 26.0–34.0)
MCHC: 32.3 g/dL (ref 30.0–36.0)
MCV: 88.2 fL (ref 80.0–100.0)
Monocytes Absolute: 0.3 10*3/uL (ref 0.1–1.0)
Monocytes Relative: 10 %
Neutro Abs: 1.4 10*3/uL — ABNORMAL LOW (ref 1.7–7.7)
Neutrophils Relative %: 51 %
Platelets: 119 10*3/uL — ABNORMAL LOW (ref 150–400)
RBC: 4.14 MIL/uL (ref 3.87–5.11)
RDW: 14 % (ref 11.5–15.5)
WBC: 2.7 10*3/uL — ABNORMAL LOW (ref 4.0–10.5)
nRBC: 0 % (ref 0.0–0.2)

## 2022-10-27 LAB — LACTATE DEHYDROGENASE: LDH: 212 U/L — ABNORMAL HIGH (ref 98–192)

## 2022-10-27 NOTE — Progress Notes (Signed)
Hematology and Oncology Follow Up Visit  Tami [name] [id number] 1956/07/02 66 y.o. 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  Tami [name] is doing pretty well.  Tami [name] sees Tami [name] gastroenterologist and cardiologist.  Tami [name] is on medication to help with the diarrhea.  Tami [name] has some changes in Tami [name] cardiac medications.  Tami [name] son moved down to [place].  Tami [name] does not want to go down to live with him.  He wants Tami [name] to go to [facility] for Christmas.  Again, Tami [name] wants to stay close to home.  Tami [name] feels well.  Tami [name] has had no issues with nausea or vomiting.  Tami [name] has had no problems with fever.  Tami [name] has had no leg swelling.  There is been no rashes.  As far as Tami [name] myeloma is concerned, this has been holding relatively stable.  Tami [name] last monoclonal spike back in November was 1.9 g/dL.  Tami [name] IgG level was 3472 mg/dL.  The Kappa light chain was 10 mg/dL.  Currently, I would have to say that Tami [name] performance status is probably ECOG 1.    Medications:  Allergies as of 10/27/2022       Reactions   Benazepril Anaphylaxis, Swelling, Hives   angioedema Throat and lip swelling   Ondansetron Hcl Hives   Redness and hives post IV admin on 07/05/17   Codeine Nausea And Vomiting   Morphine Hives   Redness and hives noted post IV admin on 07/05/17        Medication List        Accurate as of October 27, 2022 10:06 AM. If you have any questions, ask your nurse or doctor.          acetaminophen 500 MG tablet Commonly known as: TYLENOL Take 500 mg by mouth in the morning.   acyclovir 400 MG  tablet Commonly known as: ZOVIRAX TAKE ONE (1) TABLET BY MOUTH TWO (2) TIMES DAILY   albuterol 108 (90 Base) MCG/ACT inhaler Commonly known as: VENTOLIN HFA Inhale 2 puffs into the lungs every 6 (six) hours as needed for wheezing or shortness of breath.   aspirin EC 81 MG tablet Take 1 tablet (81 mg total) by mouth daily. What changed: when to take this   cetirizine 10 MG tablet Commonly known as: ZYRTEC Take 10 mg by mouth at bedtime.   colestipol 1 g tablet Commonly known as: COLESTID Take 3-4 tablets (3-4 g total) by mouth every 12 (twelve) hours.   dicyclomine 10 MG capsule Commonly known as: BENTYL Take 1 capsule (10 mg total) by mouth 4 (four) times daily -  before meals and at bedtime.   diphenoxylate-atropine 2.5-0.025 MG tablet Commonly known as: LOMOTIL Take 1 tablet by mouth 4 (four) times daily as needed for diarrhea or loose stools.   fluticasone 50 MCG/ACT nasal spray Commonly known as: FLONASE Place 2 sprays into both nostrils daily. What changed:  when to take this reasons to take this   furosemide 20 MG tablet Commonly known as: LASIX Take 20 mg by mouth daily as needed (fluid retention (  feet swelling)).   hyoscyamine 0.125 MG SL tablet Commonly known as: LEVSIN SL Place 1 tablet (0.125 mg total) under the tongue every 4 (four) hours as needed.   loperamide 2 MG capsule Commonly known as: IMODIUM Take 1 capsule (2 mg total) by mouth 4 (four) times daily as needed for diarrhea or loose stools.   losartan 50 MG tablet Commonly known as: COZAAR Take 50 mg by mouth in the morning and at bedtime.   Magnesium Glycinate 100 MG Caps Take 400 mg by mouth at bedtime.   metoprolol succinate 50 MG 24 hr tablet Commonly known as: TOPROL-XL Take 1 tablet by mouth daily.   nitrofurantoin (macrocrystal-monohydrate) 100 MG capsule Commonly known as: MACROBID Take 100 mg by mouth 2 (two) times daily.   oxyCODONE-acetaminophen 5-325 MG tablet Commonly  known as: PERCOCET/ROXICET Take 1 tablet by mouth every 6 (six) hours as needed for severe pain.   potassium chloride SA 20 MEQ tablet Commonly known as: KLOR-CON M Take 1 tablet (20 mEq total) by mouth daily.   PROBIOTIC PO Take 1 capsule by mouth every evening.   promethazine 25 MG tablet Commonly known as: PHENERGAN Take 1 tablet (25 mg total) by mouth every 8 (eight) hours as needed for up to 15 doses for nausea or vomiting.   spironolactone 25 MG tablet Commonly known as: ALDACTONE Take 12.5 mg by mouth every evening.   sucralfate 1 g tablet Commonly known as: Carafate Take 1 tablet (1 g total) by mouth 4 (four) times daily -  with meals and at bedtime.   sulfamethoxazole-trimethoprim 800-160 MG tablet Commonly known as: BACTRIM DS Take 1 tablet by mouth 2 (two) times daily.   Vitamin D3 50 MCG (2000 UT) Tabs Take 2,000 Units by mouth in the morning.        Allergies:  Allergies  Allergen Reactions   Benazepril Anaphylaxis, Swelling and Hives    angioedema Throat and lip swelling   Ondansetron Hcl Hives    Redness and hives post IV admin on 07/05/17   Codeine Nausea And Vomiting   Morphine Hives    Redness and hives noted post IV admin on 07/05/17    Past Medical History, Surgical history, Social history, and Family History were reviewed and updated.  Review of Systems: Review of Systems  Constitutional: Negative.   HENT: Negative.    Eyes: Negative.   Respiratory: Negative.    Cardiovascular: Negative.   Gastrointestinal: Negative.   Genitourinary: Negative.   Musculoskeletal: Negative.   Skin: Negative.   Neurological: Negative.   Endo/Heme/Allergies: Negative.   Psychiatric/Behavioral: Negative.        Physical Exam:  height is '5\' 6"'$  (1.676 m) and weight is 192 lb (87.1 kg). Tami [name] oral temperature is 97.8 F (36.6 C). Tami [name] blood pressure is 163/91 (abnormal) and Tami [name] pulse is 90. Tami [name] respiration is 18 and oxygen saturation is 95%.   Wt Readings  from Last 3 Encounters:  10/27/22 192 lb (87.1 kg)  10/13/22 191 lb (86.6 kg)  09/22/22 190 lb 1.3 oz (86.2 kg)    Physical Exam Vitals reviewed.  HENT:     Head: Normocephalic and atraumatic.  Eyes:     Pupils: Pupils are equal, round, and reactive to light.     Comments: Mild pallor of conjunctiva  Cardiovascular:     Rate and Rhythm: Normal rate and regular rhythm.     Heart sounds: Normal heart sounds.  Pulmonary:     Effort: Pulmonary effort is normal.  Breath sounds: Normal breath sounds.  Abdominal:     General: Bowel sounds are normal.     Palpations: Abdomen is soft.  Musculoskeletal:        General: No tenderness or deformity. Normal range of motion.     Cervical back: Normal range of motion.  Lymphadenopathy:     Cervical: No cervical adenopathy.  Skin:    General: Skin is warm and dry.     Findings: No erythema or rash.  Neurological:     Mental Status: Tami [name] is alert and oriented to person, place, and time.  Psychiatric:        Behavior: Behavior normal.        Thought Content: Thought content normal.        Judgment: Judgment normal.      Lab Results  Component Value Date   WBC 2.7 (L) 10/27/2022   HGB 11.8 (L) 10/27/2022   HCT 36.5 10/27/2022   MCV 88.2 10/27/2022   PLT 119 (L) 10/27/2022   Lab Results  Component Value Date   FERRITIN 680 (H) 09/22/2022   IRON 71 09/22/2022   TIBC 344 09/22/2022   UIBC 273 09/22/2022   IRONPCTSAT 21 09/22/2022   Lab Results  Component Value Date   RETICCTPCT 0.8 07/16/2016   RBC 4.14 10/27/2022   RETICCTABS 32,640 07/16/2016   Lab Results  Component Value Date   KPAFRELGTCHN 100.8 (H) 09/22/2022   LAMBDASER 9.6 09/22/2022   KAPLAMBRATIO 10.50 (H) 09/22/2022   Lab Results  Component Value Date   IGGSERUM 3,472 (H) 09/22/2022   IGA 187 09/22/2022   IGMSERUM 169 09/22/2022   Lab Results  Component Value Date   TOTALPROTELP 8.9 (H) 09/22/2022   ALBUMINELP 3.4 09/22/2022   A1GS 0.3 09/22/2022    A2GS 1.1 (H) 09/22/2022   BETS 1.2 09/22/2022   BETA2SER 0.5 07/16/2016   GAMS 2.9 (H) 09/22/2022   MSPIKE 1.9 (H) 09/22/2022   SPEI Comment 05/01/2022     Chemistry      Component Value Date/Time   NA 139 10/27/2022 0839   NA 143 11/08/2017 1127   NA 140 12/31/2016 1000   K 4.4 10/27/2022 0839   K 4.0 11/08/2017 1127   K 3.6 12/31/2016 1000   CL 106 10/27/2022 0839   CL 107 11/08/2017 1127   CO2 25 10/27/2022 0839   CO2 25 11/08/2017 1127   CO2 24 12/31/2016 1000   BUN 20 10/27/2022 0839   BUN 19 11/08/2017 1127   BUN 22.3 12/31/2016 1000   CREATININE 0.74 10/27/2022 0839   CREATININE 0.9 11/08/2017 1127   CREATININE 0.8 12/31/2016 1000      Component Value Date/Time   CALCIUM 8.9 10/27/2022 0839   CALCIUM 9.9 11/08/2017 1127   CALCIUM 9.5 12/31/2016 1000   ALKPHOS 46 10/27/2022 0839   ALKPHOS 44 11/08/2017 1127   ALKPHOS 51 12/31/2016 1000   AST 29 10/27/2022 0839   AST 28 12/31/2016 1000   ALT 12 10/27/2022 0839   ALT 36 11/08/2017 1127   ALT 16 12/31/2016 1000   BILITOT 0.4 10/27/2022 0839   BILITOT 0.46 12/31/2016 1000      No diagnosis found.   Impression and Plan: Tami [name] is a very pleasant 66 yo [place] American female with a previous IgG kappa MGUS with progression to myeloma.   We will have to see what Tami [name] monoclonal studies look like.  Hopefully, they will continue to hold steady.  I know that Tami [name] has had  difficulty with different lines of therapy.  I would like to see Tami [name] back in January now.  Hopefully, we can maybe move Tami [name] appointments back a little bit further.  I know that Tami [name] has been seen by other doctors try to help with Tami [name] other health issues.  [name], MD 12/19/202310:06 AM

## 2022-10-28 LAB — IGG, IGA, IGM
IgA: 190 mg/dL (ref 87–352)
IgG (Immunoglobin G), Serum: 3469 mg/dL — ABNORMAL HIGH (ref 586–1602)
IgM (Immunoglobulin M), Srm: 156 mg/dL (ref 26–217)

## 2022-10-28 LAB — KAPPA/LAMBDA LIGHT CHAINS
Kappa free light chain: 94.3 mg/L — ABNORMAL HIGH (ref 3.3–19.4)
Kappa, lambda light chain ratio: 11.79 — ABNORMAL HIGH (ref 0.26–1.65)
Lambda free light chains: 8 mg/L (ref 5.7–26.3)

## 2022-10-29 LAB — PROTEIN ELECTROPHORESIS, SERUM
A/G Ratio: 0.6 — ABNORMAL LOW (ref 0.7–1.7)
Albumin ELP: 3.2 g/dL (ref 2.9–4.4)
Alpha-1-Globulin: 0.2 g/dL (ref 0.0–0.4)
Alpha-2-Globulin: 1.1 g/dL — ABNORMAL HIGH (ref 0.4–1.0)
Beta Globulin: 1.2 g/dL (ref 0.7–1.3)
Gamma Globulin: 2.9 g/dL — ABNORMAL HIGH (ref 0.4–1.8)
Globulin, Total: 5.4 g/dL — ABNORMAL HIGH (ref 2.2–3.9)
M-Spike, %: 2.3 g/dL — ABNORMAL HIGH
Total Protein ELP: 8.6 g/dL — ABNORMAL HIGH (ref 6.0–8.5)

## 2022-11-03 ENCOUNTER — Telehealth: Payer: Self-pay | Admitting: *Deleted

## 2022-11-03 NOTE — Telephone Encounter (Signed)
-----   Message from Hughie Closs, [place] sent at 10/30/2022  3:50 PM EST ----- Patient's M-spike went up more than expected. I want to follow closely. Can she return in 1 month to see MD with labs 1 day before  ----- Message ----- From: Interface, Lab In [place] Sent: 10/27/2022   9:02 AM EST To: Hughie Closs, PA-C

## 2022-11-03 NOTE — Telephone Encounter (Signed)
Called and lm with patient to call back regarding lab results.

## 2022-11-23 ENCOUNTER — Encounter: Payer: Self-pay | Admitting: Hematology & Oncology

## 2022-11-27 ENCOUNTER — Encounter: Payer: Self-pay | Admitting: Hematology & Oncology

## 2022-11-27 ENCOUNTER — Inpatient Hospital Stay: Payer: 59 | Attending: Hematology & Oncology

## 2022-11-27 ENCOUNTER — Inpatient Hospital Stay (HOSPITAL_BASED_OUTPATIENT_CLINIC_OR_DEPARTMENT_OTHER): Payer: 59 | Admitting: Hematology & Oncology

## 2022-11-27 VITALS — BP 153/97 | HR 97 | Temp 97.6°F | Resp 20 | Ht 66.0 in | Wt 191.8 lb

## 2022-11-27 DIAGNOSIS — C9000 Multiple myeloma not having achieved remission: Secondary | ICD-10-CM | POA: Insufficient documentation

## 2022-11-27 LAB — CMP (CANCER CENTER ONLY)
ALT: 11 U/L (ref 0–44)
AST: 25 U/L (ref 15–41)
Albumin: 3.8 g/dL (ref 3.5–5.0)
Alkaline Phosphatase: 51 U/L (ref 38–126)
Anion gap: 6 (ref 5–15)
BUN: 19 mg/dL (ref 8–23)
CO2: 28 mmol/L (ref 22–32)
Calcium: 9.9 mg/dL (ref 8.9–10.3)
Chloride: 106 mmol/L (ref 98–111)
Creatinine: 0.72 mg/dL (ref 0.44–1.00)
GFR, Estimated: >60 mL/min (ref >60)
Glucose, Bld: 93 mg/dL (ref 70–99)
Potassium: 4.7 mmol/L (ref 3.5–5.1)
Sodium: 140 mmol/L (ref 135–145)
Total Bilirubin: 0.5 mg/dL (ref 0.3–1.2)
Total Protein: 9.3 g/dL — ABNORMAL HIGH (ref 6.5–8.1)

## 2022-11-27 LAB — CBC WITH DIFFERENTIAL (CANCER CENTER ONLY)
Abs Immature Granulocytes: 0.01 10*3/uL (ref 0.00–0.07)
Basophils Absolute: 0 10*3/uL (ref 0.0–0.1)
Basophils Relative: 1 %
Eosinophils Absolute: 0.1 10*3/uL (ref 0.0–0.5)
Eosinophils Relative: 4 %
HCT: 37.5 % (ref 36.0–46.0)
Hemoglobin: 11.8 g/dL — ABNORMAL LOW (ref 12.0–15.0)
Immature Granulocytes: 1 %
Lymphocytes Relative: 40 %
Lymphs Abs: 0.9 10*3/uL (ref 0.7–4.0)
MCH: 28.4 pg (ref 26.0–34.0)
MCHC: 31.5 g/dL (ref 30.0–36.0)
MCV: 90.4 fL (ref 80.0–100.0)
Monocytes Absolute: 0.2 10*3/uL (ref 0.1–1.0)
Monocytes Relative: 11 %
Neutro Abs: 1 10*3/uL — ABNORMAL LOW (ref 1.7–7.7)
Neutrophils Relative %: 43 %
Platelet Count: 110 10*3/uL — ABNORMAL LOW (ref 150–400)
RBC: 4.15 MIL/uL (ref 3.87–5.11)
RDW: 14.2 % (ref 11.5–15.5)
WBC Count: 2.2 10*3/uL — ABNORMAL LOW (ref 4.0–10.5)
nRBC: 0 % (ref 0.0–0.2)

## 2022-11-27 LAB — IRON AND IRON BINDING CAPACITY (CC-WL,HP ONLY)
Iron: 77 ug/dL (ref 28–170)
Saturation Ratios: 23 % (ref 10.4–31.8)
TIBC: 332 ug/dL (ref 250–450)
UIBC: 255 ug/dL (ref 148–442)

## 2022-11-27 LAB — LACTATE DEHYDROGENASE: LDH: 190 U/L (ref 98–192)

## 2022-11-27 LAB — RETICULOCYTES
Immature Retic Fract: 12 % (ref 2.3–15.9)
RBC.: 4.09 MIL/uL (ref 3.87–5.11)
Retic Count, Absolute: 50.7 10*3/uL (ref 19.0–186.0)
Retic Ct Pct: 1.2 % (ref 0.4–3.1)

## 2022-11-27 LAB — FERRITIN: Ferritin: 519 ng/mL — ABNORMAL HIGH (ref 11–307)

## 2022-11-27 NOTE — Progress Notes (Signed)
Hematology and Oncology Follow Up Visit  Tami [name] [id number] [birth year] 67 y.o. 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  She is not having any diarrhea.  I am so happy about this.  Her life is somewhat better now that the diarrhea is better.  She had a nice Holiday.  She was up in [place] at a sister's house.  She feels okay.  She has had no complaints.  There is been no cough or shortness of breath.  She has had no sinus drainage.  When we last saw her, her monoclonal spike was up a little bit.  The M spike was 2.3 g/dL.  Her IgG level was 3470 mg/dL.  The Kappa light chain was 9.4 mg/dL.  She has had no fever.  She has had no probably COVID or Influenza.  She is still very diligent.  Thankfully, her heart has been doing okay.  She has had no issues with the pacemaker.  She sees her cardiologist in April.  Overall, I would say that her performance status is probably ECOG 1.    Medications:  Allergies as of 11/27/2022       Reactions   Benazepril Anaphylaxis, Swelling, Hives   angioedema Throat and lip swelling   Ondansetron Hcl Hives   Redness and hives post IV admin on 07/05/17   Codeine Nausea And Vomiting   Morphine Hives   Redness and hives noted post IV admin on 07/05/17        Medication List        Accurate as of November 27, 2022 10:22 AM. If you have any questions, ask your nurse or doctor.          STOP taking these medications    nitrofurantoin (macrocrystal-monohydrate) 100 MG capsule Commonly known as: MACROBID Stopped by:  [name], MD   PROBIOTIC PO Stopped by: [name], MD   sulfamethoxazole-trimethoprim 800-160 MG tablet Commonly known as: BACTRIM DS Stopped by: [name], MD       TAKE these medications    acetaminophen 500 MG tablet Commonly known as: TYLENOL Take 500 mg by mouth in the morning.   acyclovir 400 MG tablet Commonly known as: ZOVIRAX TAKE ONE (1) TABLET BY MOUTH TWO (2) TIMES DAILY   albuterol 108 (90 Base) MCG/ACT inhaler Commonly known as: VENTOLIN HFA Inhale 2 puffs into the lungs every 6 (six) hours as needed for wheezing or shortness of breath.   aspirin EC 81 MG tablet Take 1 tablet (81 mg total) by mouth daily. What changed: when to take this   cetirizine 10 MG tablet Commonly known as: ZYRTEC Take 10 mg by mouth at bedtime.   colestipol 1 g tablet Commonly known as: COLESTID Take 3-4 tablets (3-4 g total) by mouth every 12 (twelve) hours. What changed: how much to take   dicyclomine 10 MG capsule Commonly known as: BENTYL Take 1 capsule (10 mg total) by mouth 4 (four) times daily -  before meals and at bedtime.   diphenoxylate-atropine 2.5-0.025  MG tablet Commonly known as: LOMOTIL Take 1 tablet by mouth 4 (four) times daily as needed for diarrhea or loose stools.   fluticasone 50 MCG/ACT nasal spray Commonly known as: FLONASE Place 2 sprays into both nostrils daily.   furosemide 20 MG tablet Commonly known as: LASIX Take 20 mg by mouth daily as needed (fluid retention (feet swelling)).   hyoscyamine 0.125 MG SL tablet Commonly known as: LEVSIN SL Place 1 tablet (0.125 mg total) under the tongue every 4 (four) hours as needed.   loperamide 2 MG capsule Commonly known as: IMODIUM Take 1 capsule (2 mg total) by mouth 4 (four) times daily as needed for diarrhea or loose stools.   losartan 50 MG tablet Commonly known as: COZAAR Take 50 mg by mouth in the morning and at bedtime.   Magnesium Glycinate 100 MG Caps Take 400 mg by  mouth at bedtime.   metoprolol succinate 50 MG 24 hr tablet Commonly known as: TOPROL-XL Take 1 tablet by mouth daily.   oxyCODONE-acetaminophen 5-325 MG tablet Commonly known as: PERCOCET/ROXICET Take 1 tablet by mouth every 6 (six) hours as needed for severe pain.   potassium chloride SA 20 MEQ tablet Commonly known as: KLOR-CON M Take 1 tablet (20 mEq total) by mouth daily.   promethazine 25 MG tablet Commonly known as: PHENERGAN Take 1 tablet (25 mg total) by mouth every 8 (eight) hours as needed for up to 15 doses for nausea or vomiting.   spironolactone 25 MG tablet Commonly known as: ALDACTONE Take 12.5 mg by mouth every evening.   sucralfate 1 g tablet Commonly known as: Carafate Take 1 tablet (1 g total) by mouth 4 (four) times daily -  with meals and at bedtime.   Vitamin D3 50 MCG (2000 UT) Tabs Take 2,000 Units by mouth in the morning.        Allergies:  Allergies  Allergen Reactions   Benazepril Anaphylaxis, Swelling and Hives    angioedema Throat and lip swelling   Ondansetron Hcl Hives    Redness and hives post IV admin on 07/05/17   Codeine Nausea And Vomiting   Morphine Hives    Redness and hives noted post IV admin on 07/05/17    Past Medical History, Surgical history, Social history, and Family History were reviewed and updated.  Review of Systems: Review of Systems  Constitutional: Negative.   HENT: Negative.    Eyes: Negative.   Respiratory: Negative.    Cardiovascular: Negative.   Gastrointestinal: Negative.   Genitourinary: Negative.   Musculoskeletal: Negative.   Skin: Negative.   Neurological: Negative.   Endo/Heme/Allergies: Negative.   Psychiatric/Behavioral: Negative.        Physical Exam:  height is '5\' 6"'$  (1.676 m) and weight is 191 lb 12.8 oz (87 kg). Her oral temperature is 97.6 F (36.4 C). Her blood pressure is 153/97 (abnormal) and her pulse is 97. Her respiration is 20 and oxygen saturation is 97%.   Wt Readings  from Last 3 Encounters:  11/27/22 191 lb 12.8 oz (87 kg)  10/27/22 192 lb (87.1 kg)  10/13/22 191 lb (86.6 kg)    Physical Exam Vitals reviewed.  HENT:     Head: Normocephalic and atraumatic.  Eyes:     Pupils: Pupils are equal, round, and reactive to light.     Comments: Mild pallor of conjunctiva  Cardiovascular:     Rate and Rhythm: Normal rate and regular rhythm.     Heart sounds: Normal heart sounds.  Pulmonary:     Effort: Pulmonary effort is normal.     Breath sounds: Normal breath sounds.  Abdominal:     General: Bowel sounds are normal.     Palpations: Abdomen is soft.  Musculoskeletal:        General: No tenderness or deformity. Normal range of motion.     Cervical back: Normal range of motion.  Lymphadenopathy:     Cervical: No cervical adenopathy.  Skin:    General: Skin is warm and dry.     Findings: No erythema or rash.  Neurological:     Mental Status: She is alert and oriented to person, place, and time.  Psychiatric:        Behavior: Behavior normal.        Thought Content: Thought content normal.        Judgment: Judgment normal.      Lab Results  Component Value Date   WBC 2.2 (L) 11/27/2022   HGB 11.8 (L) 11/27/2022   HCT 37.5 11/27/2022   MCV 90.4 11/27/2022   PLT 110 (L) 11/27/2022   Lab Results  Component Value Date   FERRITIN 680 (H) 09/22/2022   IRON 71 09/22/2022   TIBC 344 09/22/2022   UIBC 273 09/22/2022   IRONPCTSAT 21 09/22/2022   Lab Results  Component Value Date   RETICCTPCT 1.2 11/27/2022   RBC 4.09 11/27/2022   RBC 4.15 11/27/2022   RETICCTABS 32,640 07/16/2016   Lab Results  Component Value Date   KPAFRELGTCHN 94.3 (H) 10/27/2022   LAMBDASER 8.0 10/27/2022   KAPLAMBRATIO 11.79 (H) 10/27/2022   Lab Results  Component Value Date   IGGSERUM 3,469 (H) 10/27/2022   IGA 190 10/27/2022   IGMSERUM 156 10/27/2022   Lab Results  Component Value Date   TOTALPROTELP 8.6 (H) 10/27/2022   ALBUMINELP 3.2 10/27/2022    A1GS 0.2 10/27/2022   A2GS 1.1 (H) 10/27/2022   BETS 1.2 10/27/2022   BETA2SER 0.5 07/16/2016   GAMS 2.9 (H) 10/27/2022   MSPIKE 2.3 (H) 10/27/2022   SPEI Comment 10/27/2022     Chemistry      Component Value Date/Time   NA 140 11/27/2022 0849   NA 143 11/08/2017 1127   NA 140 12/31/2016 1000   K 4.7 11/27/2022 0849   K 4.0 11/08/2017 1127   K 3.6 12/31/2016 1000   CL 106 11/27/2022 0849   CL 107 11/08/2017 1127   CO2 28 11/27/2022 0849   CO2 25 11/08/2017 1127   CO2 24 12/31/2016 1000   BUN 19 11/27/2022 0849   BUN 19 11/08/2017 1127   BUN 22.3 12/31/2016 1000   CREATININE 0.72 11/27/2022 0849   CREATININE 0.9 11/08/2017 1127   CREATININE 0.8 12/31/2016 1000      Component Value Date/Time   CALCIUM 9.9 11/27/2022 0849   CALCIUM 9.9 11/08/2017 1127   CALCIUM 9.5 12/31/2016 1000   ALKPHOS 51 11/27/2022 0849   ALKPHOS 44 11/08/2017 1127   ALKPHOS 51 12/31/2016 1000   AST 25 11/27/2022 0849   AST 28 12/31/2016 1000   ALT 11 11/27/2022 0849   ALT 36 11/08/2017 1127   ALT 16 12/31/2016 1000   BILITOT 0.5 11/27/2022 0849   BILITOT 0.46 12/31/2016 1000       Impression and Plan: Ms. Donofrio is a very pleasant 67 yo African American female with a previous IgG kappa MGUS with progression to myeloma.   We will have to see what her monoclonal studies look like.  I realize that the levels have been up a little bit.  However, she really has had a tough time with treatment.  We had to be very cautious if we do have to treat her.  I am just happy that the diarrhea is doing better.  This really has been the determinant of her quality of life.  We will plan to get her back in another month or so.  It would be nice to try to get her through the Winter.     [name], MD 1/19/202410:22 AM

## 2022-11-28 LAB — IGG, IGA, IGM
IgA: 187 mg/dL (ref 87–352)
IgG (Immunoglobin G), Serum: 3388 mg/dL — ABNORMAL HIGH (ref 586–1602)
IgM (Immunoglobulin M), Srm: 143 mg/dL (ref 26–217)

## 2022-11-30 ENCOUNTER — Other Ambulatory Visit: Payer: Self-pay

## 2022-11-30 ENCOUNTER — Encounter: Payer: Self-pay | Admitting: Hematology & Oncology

## 2022-11-30 ENCOUNTER — Telehealth: Payer: Self-pay

## 2022-11-30 ENCOUNTER — Ambulatory Visit (INDEPENDENT_AMBULATORY_CARE_PROVIDER_SITE_OTHER): Payer: 59 | Admitting: Family Medicine

## 2022-11-30 ENCOUNTER — Emergency Department (HOSPITAL_BASED_OUTPATIENT_CLINIC_OR_DEPARTMENT_OTHER)
Admission: EM | Admit: 2022-11-30 | Discharge: 2022-11-30 | Disposition: A | Discharge status: Home / Self Care | Payer: 59 | Attending: Emergency Medicine | Admitting: Emergency Medicine

## 2022-11-30 ENCOUNTER — Encounter (HOSPITAL_BASED_OUTPATIENT_CLINIC_OR_DEPARTMENT_OTHER): Payer: Self-pay

## 2022-11-30 VITALS — BP 152/86 | HR 94 | Temp 98.0°F | Resp 16 | Ht 66.0 in | Wt 194.2 lb

## 2022-11-30 DIAGNOSIS — D801 Nonfamilial hypogammaglobulinemia: Secondary | ICD-10-CM | POA: Diagnosis not present

## 2022-11-30 DIAGNOSIS — I110 Hypertensive heart disease with heart failure: Secondary | ICD-10-CM | POA: Diagnosis not present

## 2022-11-30 DIAGNOSIS — R079 Chest pain, unspecified: Secondary | ICD-10-CM | POA: Diagnosis not present

## 2022-11-30 DIAGNOSIS — Z7982 Long term (current) use of aspirin: Secondary | ICD-10-CM | POA: Insufficient documentation

## 2022-11-30 DIAGNOSIS — Z79899 Other long term (current) drug therapy: Secondary | ICD-10-CM | POA: Insufficient documentation

## 2022-11-30 DIAGNOSIS — I5020 Unspecified systolic (congestive) heart failure: Secondary | ICD-10-CM | POA: Diagnosis not present

## 2022-11-30 DIAGNOSIS — R197 Diarrhea, unspecified: Secondary | ICD-10-CM | POA: Diagnosis not present

## 2022-11-30 DIAGNOSIS — E860 Dehydration: Secondary | ICD-10-CM

## 2022-11-30 DIAGNOSIS — Z8579 Personal history of other malignant neoplasms of lymphoid, hematopoietic and related tissues: Secondary | ICD-10-CM | POA: Diagnosis not present

## 2022-11-30 DIAGNOSIS — D72819 Decreased white blood cell count, unspecified: Secondary | ICD-10-CM | POA: Diagnosis not present

## 2022-11-30 DIAGNOSIS — D696 Thrombocytopenia, unspecified: Secondary | ICD-10-CM

## 2022-11-30 DIAGNOSIS — R0789 Other chest pain: Secondary | ICD-10-CM | POA: Diagnosis not present

## 2022-11-30 DIAGNOSIS — I10 Essential (primary) hypertension: Secondary | ICD-10-CM

## 2022-11-30 DIAGNOSIS — I5041 Acute combined systolic (congestive) and diastolic (congestive) heart failure: Secondary | ICD-10-CM | POA: Diagnosis not present

## 2022-11-30 DIAGNOSIS — I776 Arteritis, unspecified: Secondary | ICD-10-CM | POA: Diagnosis not present

## 2022-11-30 DIAGNOSIS — Z950 Presence of cardiac pacemaker: Secondary | ICD-10-CM | POA: Diagnosis not present

## 2022-11-30 LAB — CBC WITH DIFFERENTIAL/PLATELET
Abs Immature Granulocytes: 0 10*3/uL (ref 0.00–0.07)
Basophils Absolute: 0 10*3/uL (ref 0.0–0.1)
Basophils Relative: 1 %
Eosinophils Absolute: 0.1 10*3/uL (ref 0.0–0.5)
Eosinophils Relative: 3 %
HCT: 36.9 % (ref 36.0–46.0)
Hemoglobin: 12 g/dL (ref 12.0–15.0)
Immature Granulocytes: 0 %
Lymphocytes Relative: 47 %
Lymphs Abs: 1.1 10*3/uL (ref 0.7–4.0)
MCH: 28.9 pg (ref 26.0–34.0)
MCHC: 32.5 g/dL (ref 30.0–36.0)
MCV: 88.9 fL (ref 80.0–100.0)
Monocytes Absolute: 0.2 10*3/uL (ref 0.1–1.0)
Monocytes Relative: 9 %
Neutro Abs: 0.9 10*3/uL — ABNORMAL LOW (ref 1.7–7.7)
Neutrophils Relative %: 40 %
Platelets: 110 10*3/uL — ABNORMAL LOW (ref 150–400)
RBC: 4.15 MIL/uL (ref 3.87–5.11)
RDW: 14.1 % (ref 11.5–15.5)
WBC: 2.4 10*3/uL — ABNORMAL LOW (ref 4.0–10.5)
nRBC: 0 % (ref 0.0–0.2)

## 2022-11-30 LAB — URINALYSIS, ROUTINE W REFLEX MICROSCOPIC
Bilirubin Urine: NEGATIVE
Glucose, UA: NEGATIVE mg/dL
Hgb urine dipstick: NEGATIVE
Ketones, ur: NEGATIVE mg/dL
Nitrite: NEGATIVE
Protein, ur: NEGATIVE mg/dL
Specific Gravity, Urine: >=1.03 (ref 1.005–1.030)
pH: 5.5 (ref 5.0–8.0)

## 2022-11-30 LAB — BASIC METABOLIC PANEL
Anion gap: 7 (ref 5–15)
BUN: 19 mg/dL (ref 8–23)
CO2: 24 mmol/L (ref 22–32)
Calcium: 9.1 mg/dL (ref 8.9–10.3)
Chloride: 106 mmol/L (ref 98–111)
Creatinine, Ser: 0.78 mg/dL (ref 0.44–1.00)
GFR, Estimated: >60 mL/min (ref >60)
Glucose, Bld: 92 mg/dL (ref 70–99)
Potassium: 4.2 mmol/L (ref 3.5–5.1)
Sodium: 137 mmol/L (ref 135–145)

## 2022-11-30 LAB — KAPPA/LAMBDA LIGHT CHAINS
Kappa free light chain: 97 mg/L — ABNORMAL HIGH (ref 3.3–19.4)
Kappa, lambda light chain ratio: 17.96 — ABNORMAL HIGH (ref 0.26–1.65)
Lambda free light chains: 5.4 mg/L — ABNORMAL LOW (ref 5.7–26.3)

## 2022-11-30 LAB — LIPASE, BLOOD: Lipase: 48 U/L (ref 11–51)

## 2022-11-30 LAB — URINALYSIS, MICROSCOPIC (REFLEX)

## 2022-11-30 LAB — TROPONIN I (HIGH SENSITIVITY)
Troponin I (High Sensitivity): 10 ng/L (ref <18)
Troponin I (High Sensitivity): 12 ng/L (ref <18)

## 2022-11-30 LAB — MAGNESIUM: Magnesium: 1.9 mg/dL (ref 1.7–2.4)

## 2022-11-30 MED ORDER — METOPROLOL SUCCINATE ER 25 MG PO TB24
50.0000 mg | ORAL_TABLET | Freq: Once | ORAL | Status: AC
  Administered 2022-11-30: 50 mg via ORAL
  Filled 2022-11-30: qty 2

## 2022-11-30 MED ORDER — LACTATED RINGERS IV BOLUS (SEPSIS): 1000.0000 mL | Freq: Once | INTRAVENOUS | Status: DC

## 2022-11-30 MED ORDER — ASPIRIN 81 MG PO CHEW: 162.0000 mg | CHEWABLE_TABLET | Freq: Once | ORAL | Status: DC

## 2022-11-30 MED ORDER — NITROGLYCERIN 0.4 MG SL SUBL: 0.4000 mg | SUBLINGUAL_TABLET | Freq: Once | SUBLINGUAL | Status: AC

## 2022-11-30 MED ORDER — LOSARTAN POTASSIUM 25 MG PO TABS
50.0000 mg | ORAL_TABLET | Freq: Once | ORAL | Status: AC
  Administered 2022-11-30: 50 mg via ORAL
  Filled 2022-11-30: qty 2

## 2022-11-30 MED ORDER — SPIRONOLACTONE 12.5 MG HALF TABLET
12.5000 mg | ORAL_TABLET | Freq: Once | ORAL | Status: AC
  Administered 2022-11-30: 12.5 mg via ORAL
  Filled 2022-11-30: qty 1

## 2022-11-30 MED ORDER — LACTATED RINGERS IV SOLN: INTRAVENOUS | Status: DC

## 2022-11-30 MED ORDER — LACTATED RINGERS IV BOLUS
500.0000 mL | Freq: Once | INTRAVENOUS | Status: AC
  Administered 2022-11-30: 500 mL via INTRAVENOUS

## 2022-11-30 NOTE — Assessment & Plan Note (Signed)
Dry mucus membranes noted and patient notes feeling week and light headed today. She is on day 4 of increased diarrhea again roughly 4 bouts daily. Not eating well but is trying to drink fluids.

## 2022-11-30 NOTE — Telephone Encounter (Signed)
Caller Name [place] Phone Number [phone] Patient Name Tami [name] Patient DOB 14-Mar-1956 Call Type Message Only Information Provided Reason for Call Request for General Office Information Initial Comment Caller states they got a text for reschedule. Additional Comment Office hours provided. Disp. Time Disposition Final User 11/28/2022 12:36:58 PM General Information Provided Yes Sheets, Ciara Call Closed By: Arminda Resides Transaction Date/Time: 11/28/2022 12:34:19 PM (ET)

## 2022-11-30 NOTE — Assessment & Plan Note (Signed)
Mildly elevated, referred to ER, encouraged DASH diet, minimize caffeine and obtain adequate sleep. Report concerning symptoms and follow up as directed and as needed

## 2022-11-30 NOTE — ED Triage Notes (Signed)
C/o diarrhea, headache, lumbar back pain, chest pain. Sent down from Dr. Charlett Blake office. Given nitroglycerin & ASA pta.Hx of low magnesium when diarrhea occurs.

## 2022-11-30 NOTE — Progress Notes (Signed)
Patient preference is to have a female nurse to start her IV.

## 2022-11-30 NOTE — Progress Notes (Signed)
Subjective:   By signing my name below, I, Daiva Huge, attest that this documentation has been prepared under the direction and in the presence of Penni Homans, MD 11/30/22   [name], female    DOB: 01-07-1956, 67 y.o.   MRN: [id number]  Chief Complaint  Patient presents with   Follow-up    Follow up    HPI Patient is in today for an office visit.  Chest Pain She started experiencing chest pain upon waiting in the exam room today. She describes the pain as a heavy feeling.  Diarrhea She has started having diarrhea x3-5 daily as of this past Friday. She is experiencing rectal burning associated with the diarrhea. She is trying to stay hydrated. Not eating well but trying to hydrate. She notes a headache at the base of her skull, low back pain and malaise, no fevers, chills  Past Medical History:  Diagnosis Date   Abnormal SPEP 07/26/2016   Arthritis    knees, hands   Back pain 07/14/2016   Bowel obstruction ([place]) 11/2014   C. 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   Iron deficiency anemia due to chronic blood loss 05/28/2022   Low back pain 07/14/2016   Monoclonal gammopathy of unknown significance (MGUS) 09/16/2016   Multiple myeloma ([place]) 07/20/2018   Multiple myeloma not having achieved remission ([place]) 07/20/2018   Myalgia 12/31/2016   Obstructive sleep apnea 03/31/2015   Presence of permanent cardiac pacemaker    SOB (shortness of breath) 04/09/2016   Stroke ([place])    TIAs   UTI (urinary tract infection)     Past Surgical History:  Procedure Laterality Date   ABDOMINAL HYSTERECTOMY     menorraghia, 2006, total   BIOPSY  09/01/2022    Procedure: BIOPSY;  Surgeon: Jackquline Denmark, MD;  Location: [facility];  Service: Gastroenterology;;   CHOLECYSTECTOMY     COLONOSCOPY  2018   [facility] per patient   COLONOSCOPY WITH PROPOFOL N/A 09/01/2022   Procedure: COLONOSCOPY WITH PROPOFOL;  Surgeon: Jackquline Denmark, MD;  Location: [facility];  Service: Gastroenterology;  Laterality: N/A;   ESOPHAGOGASTRODUODENOSCOPY  2018   [facility]    ESOPHAGOGASTRODUODENOSCOPY (EGD) WITH PROPOFOL N/A 09/01/2022   Procedure: ESOPHAGOGASTRODUODENOSCOPY (EGD) WITH PROPOFOL;  Surgeon: Jackquline Denmark, MD;  Location: [facility];  Service: Gastroenterology;  Laterality: N/A;   KNEE SURGERY     right, repair torn torn cartialage   PACEMAKER INSERTION  08/2014   TONSILLECTOMY     TUBAL LIGATION      Family History  Problem Relation Age of Onset   Diabetes Mother    Hypertension Mother    Heart disease Mother        s/p 1 stent   Hyperlipidemia Mother    Arthritis Mother    Cancer Father        COLON   Colon cancer Father 74   Irritable bowel syndrome Sister    Hyperlipidemia Daughter    Hypertension Daughter    Hypertension Maternal Grandmother    Arthritis Maternal Grandmother    Heart disease Maternal Grandfather        MI  Hypertension Maternal Grandfather    Arthritis Maternal Grandfather    Hypertension Son    Esophageal cancer Neg Hx     Social History   Socioeconomic History   Marital status: Divorced    Spouse name: [name]   Number of children: 3   Years of education: [name]   Highest education level: High school graduate  Occupational History   [name]  Tobacco Use   Smoking status: Never   Smokeless tobacco: Never  Vaping Use   Vaping Use: Never used  Substance and Sexual Activity   Alcohol use: No   Drug use: No   Sexual activity: Not Currently  Other Topics Concern   [name]  Social History Narrative   Lives at home alone   Retired   Caffeine: coffee    Social Determinants of Health   Financial Resource Strain: Low Risk  (10/17/2021)   Overall Financial Resource Strain (CARDIA)    Difficulty of Paying Living Expenses: Not hard at all  Food Insecurity: No Food Insecurity (10/17/2021)   Hunger Vital Sign    Worried About Running Out of Food in the Last Year: Never true    [place] in the Last Year: Never true  Transportation Needs: No Transportation Needs (10/17/2021)   PRAPARE - Hydrologist (Medical): No    Lack of Transportation (Non-Medical): No  Physical Activity: Insufficiently Active (10/17/2021)   Exercise Vital Sign    Days of Exercise per Week: 2 days    Minutes of Exercise per Session: 30 min  Stress: No Stress Concern Present (10/17/2021)   [place]    Feeling of Stress : Not at all  Social Connections: Moderately Isolated (10/17/2021)   Social Connection and Isolation Panel [NHANES]    Frequency of Communication with Friends and Family: More than three times a week    Frequency of Social Gatherings with Friends and Family: More than three times a week    Attends Religious Services: More than 4 times per year    Active Member of [employer] or Organizations: No    Attends Archivist Meetings: Never    Marital Status: Divorced  Human resources officer Violence: Not At Risk (10/17/2021)   Humiliation, Afraid, Rape, and Kick questionnaire    Fear of Current or Ex-Partner: No    Emotionally Abused: No    Physically Abused: No    Sexually Abused: No    Outpatient Medications Prior to Visit  Medication Sig Dispense Refill   acetaminophen (TYLENOL) 500 MG tablet Take 500 mg by mouth in the morning.     acyclovir (ZOVIRAX) 400 MG tablet TAKE ONE (1) TABLET BY MOUTH TWO (2) TIMES DAILY 60 tablet 2   albuterol (VENTOLIN HFA) 108 (90 Base) MCG/ACT inhaler Inhale 2 puffs into the lungs every 6 (six) hours as needed for wheezing or  shortness of breath. 18 g 5   aspirin EC 81 MG tablet Take 1 tablet (81 mg total) by mouth daily. (Patient taking differently: Take 81 mg by mouth every evening.) 90 tablet 1   cetirizine (ZYRTEC) 10 MG tablet Take 10 mg by mouth at bedtime.     Cholecalciferol (VITAMIN D3) 50 MCG (2000 UT) TABS Take 2,000 Units by mouth in the morning.     colestipol (COLESTID) 1 g tablet Take 3-4 tablets (3-4 g total) by mouth every 12 (twelve) hours. (Patient taking  differently: Take 2 g by mouth every 12 (twelve) hours.) 240 tablet 3   dicyclomine (BENTYL) 10 MG capsule Take 1 capsule (10 mg total) by mouth 4 (four) times daily -  before meals and at bedtime. 120 capsule 2   diphenoxylate-atropine (LOMOTIL) 2.5-0.025 MG tablet Take 1 tablet by mouth 4 (four) times daily as needed for diarrhea or loose stools. 100 tablet 0   fluticasone (FLONASE) 50 MCG/ACT nasal spray Place 2 sprays into both nostrils daily. 16 g 0   furosemide (LASIX) 20 MG tablet Take 20 mg by mouth daily as needed (fluid retention (feet swelling)).     hyoscyamine (LEVSIN SL) 0.125 MG SL tablet Place 1 tablet (0.125 mg total) under the tongue every 4 (four) hours as needed. 30 tablet 1   loperamide (IMODIUM) 2 MG capsule Take 1 capsule (2 mg total) by mouth 4 (four) times daily as needed for diarrhea or loose stools. 12 capsule 0   losartan (COZAAR) 50 MG tablet Take 50 mg by mouth in the morning and at bedtime.     Magnesium Glycinate 100 MG CAPS Take 400 mg by mouth at bedtime.     metoprolol succinate (TOPROL-XL) 50 MG 24 hr tablet Take 1 tablet by mouth daily.     oxyCODONE-acetaminophen (PERCOCET/ROXICET) 5-325 MG tablet Take 1 tablet by mouth every 6 (six) hours as needed for severe pain. 30 tablet 0   potassium chloride SA (KLOR-CON M) 20 MEQ tablet Take 1 tablet (20 mEq total) by mouth daily. 30 tablet 2   promethazine (PHENERGAN) 25 MG tablet Take 1 tablet (25 mg total) by mouth every 8 (eight) hours as needed for up to 15 doses for  nausea or vomiting. 15 tablet 0   spironolactone (ALDACTONE) 25 MG tablet Take 12.5 mg by mouth every evening.     sucralfate (CARAFATE) 1 g tablet Take 1 tablet (1 g total) by mouth 4 (four) times daily -  with meals and at bedtime. 120 tablet 2   No facility-administered medications prior to visit.    Allergies  Allergen Reactions   Benazepril Anaphylaxis, Swelling and Hives    angioedema Throat and lip swelling   Ondansetron Hcl Hives    Redness and hives post IV admin on 07/05/17   Codeine Nausea And Vomiting   Morphine Hives    Redness and hives noted post IV admin on 07/05/17    Review of Systems  Constitutional:  Positive for malaise/fatigue. Negative for diaphoresis.  Respiratory:  Negative for shortness of breath.   Cardiovascular:  Positive for chest pain (Heavy feeling).  Gastrointestinal:  Positive for diarrhea (With accompanying burning).  Musculoskeletal:  Positive for back pain.  Neurological:  Positive for headaches.       (+) Lightheadedness       Objective:    Physical Exam Constitutional:      General: She is not in acute distress.    Appearance: Normal appearance. She is not ill-appearing.  HENT:     Head: Normocephalic and atraumatic.     Right Ear: External ear normal.     Left Ear: External ear normal.     Mouth/Throat:     Mouth: Mucous membranes are dry.  Eyes:     Extraocular Movements: Extraocular movements intact.     Pupils: Pupils are equal, round, and reactive to light.  Cardiovascular:     Rate and Rhythm: Normal rate and regular rhythm.     Heart sounds: Normal heart sounds. No murmur heard.  No gallop.  Pulmonary:     Effort: Pulmonary effort is normal. No respiratory distress.     Breath sounds: Normal breath sounds. No wheezing or rales.  Skin:    General: Skin is warm and dry.  Neurological:     Mental Status: She is alert and oriented to person, place, and time.  Psychiatric:        Judgment: Judgment normal.     BP  (!) 152/86 (BP Location: Right Arm, Patient Position: Sitting, Cuff Size: Normal)   Pulse 94   Temp 98 F (36.7 C) (Oral)   Resp 16   Ht '5\' 6"'$  (1.676 m)   Wt 194 lb 3.2 oz (88.1 kg)   SpO2 95%   BMI 31.34 kg/m  Wt Readings from Last 3 Encounters:  11/30/22 194 lb 3.2 oz (88.1 kg)  11/27/22 191 lb 12.8 oz (87 kg)  10/27/22 192 lb (87.1 kg)       Assessment & Plan:  Chest pain, unspecified type Assessment & Plan: She notes chest discomfort started today. EKG shows a paced rhythm. Given NTG SL and aspirin 162 mg and transported to Emergency Room.  Orders: -     EKG 12-Lead  Diarrhea, unspecified type Assessment & Plan: Patient with a history of recurrent diarrhea including CDiff in the past. She notes it does not smell like CDiff so far and no one has started her on an antibiotic recently. She has had roughly 4 loose stool daily x 4 days. No fevers, chills or bloody stool.   Orders: -     Clostridium difficile culture-fecal; Future -     Stool culture; Future -     Ova and parasite examination; Future -     Fecal lactoferrin, quant; Future  Dehydration Assessment & Plan: Dry mucus membranes noted and patient notes feeling week and light headed today. She is on day 4 of increased diarrhea again roughly 4 bouts daily. Not eating well but is trying to drink fluids.    Hypogammaglobulinemia ([place])  Acute combined systolic and diastolic heart failure (HCC)  Arteritis (HCC)  Thrombocytopenia (HCC)  Hypomagnesemia Assessment & Plan: Frequently drops when diarrhea recurs she notes headache and chest pain as well transported to ER for evaluation   Primary hypertension Assessment & Plan: Mildly elevated, referred to ER, encouraged DASH diet, minimize caffeine and obtain adequate sleep. Report concerning symptoms and follow up as directed and as needed       I,Alexander Ruley,acting as a scribe for Penni Homans, MD.,have documented all relevant documentation on the  behalf of Penni Homans, MD,as directed by  Penni Homans, MD while in the presence of Penni Homans, MD.   I, Penni Homans, MD, personally preformed the services described in this documentation.  All medical record entries made by the scribe were at my direction and in my presence.  I have reviewed the chart and discharge instructions (if applicable) and agree that the record reflects my personal performance and is accurate and complete. 11/30/22   Penni Homans, MD

## 2022-11-30 NOTE — ED Notes (Signed)
Introduced myself to patient. Patient denies chest pain, N/V at this time.

## 2022-11-30 NOTE — Discharge Instructions (Signed)
Your exam and workup today is overall reassuring.  Continue taking Imodium.  As discussed take 2 tablets in the morning, then 1 tablet after each episode of diarrhea for a total of no more than 8 tablets given 24-hour period.  Follow-up with your primary care doctor and provide the stool samples.  For any concerning symptoms return to the emergency department.  You can increase your fluid intake.

## 2022-11-30 NOTE — Addendum Note (Signed)
Addended by: Laure Kidney on: 11/30/2022 05:00 PM   Modules accepted: Orders

## 2022-11-30 NOTE — Assessment & Plan Note (Signed)
Frequently drops when diarrhea recurs she notes headache and chest pain as well transported to ER for evaluation

## 2022-11-30 NOTE — Assessment & Plan Note (Signed)
Patient with a history of recurrent diarrhea including CDiff in the past. She notes it does not smell like CDiff so far and no one has started her on an antibiotic recently. She has had roughly 4 loose stool daily x 4 days. No fevers, chills or bloody stool.

## 2022-11-30 NOTE — ED Provider Notes (Signed)
[facility] Provider Note   CSN: [id number] Arrival date & time: 11/30/22  1631     History  Chief Complaint  Patient presents with   Chest Pain   Diarrhea    Tami [name] is a 67 y.o. female.  67 year old female presents today for evaluation of diarrhea, and chest pain.  Diarrhea has been ongoing for about a week.  She went to her PCP office today for evaluation.  She states she has been having about 4 episodes of loose stools per day for the past week.  Reports decent amount of p.o. hydration.  While she was at the PCP office she also developed chest pain.  States this only lasted a few minutes.  She was recommended to come to the emergency department.  Denies current chest pain, shortness of breath.  She was given a dose of 325 mg aspirin, and nitroglycerin.  Her chest pain improved prior to receiving the nitroglycerin.  Has history of HFrEF with about 25-30% EF.  ICD in place.  She has had similar episodes in the past.  States with these episodes she has had chest pain in the past as well.  States she is concerned her magnesium may be low as that is a common occurrence with these episodes for her.  Denies abdominal pain, dysuria, flank pain.  She states she takes Lasix as needed.  Has not had to take it for about a year now.  The history is provided by the patient. No language interpreter was used.       Home Medications Prior to Admission medications   Medication Sig Start Date End Date Taking? Authorizing Provider  acetaminophen (TYLENOL) 500 MG tablet Take 500 mg by mouth in the morning.    [provider]  acyclovir (ZOVIRAX) 400 MG tablet TAKE ONE (1) TABLET BY MOUTH TWO (2) TIMES DAILY 08/29/22   Ennever, Rudell Cobb, MD  albuterol (VENTOLIN HFA) 108 (90 Base) MCG/ACT inhaler Inhale 2 puffs into the lungs every 6 (six) hours as needed for wheezing or shortness of breath. 11/20/21   Mosie Lukes, MD  aspirin EC 81 MG  tablet Take 1 tablet (81 mg total) by mouth daily. Patient taking differently: Take 81 mg by mouth every evening. 02/15/18   Mosie Lukes, MD  cetirizine (ZYRTEC) 10 MG tablet Take 10 mg by mouth at bedtime.    [provider]  Cholecalciferol (VITAMIN D3) 50 MCG (2000 UT) TABS Take 2,000 Units by mouth in the morning.    [provider]  colestipol (COLESTID) 1 g tablet Take 3-4 tablets (3-4 g total) by mouth every 12 (twelve) hours. Patient taking differently: Take 2 g by mouth every 12 (twelve) hours. 10/13/22   Mosie Lukes, MD  dicyclomine (BENTYL) 10 MG capsule Take 1 capsule (10 mg total) by mouth 4 (four) times daily -  before meals and at bedtime. 10/13/22 01/11/23  Mosie Lukes, MD  diphenoxylate-atropine (LOMOTIL) 2.5-0.025 MG tablet Take 1 tablet by mouth 4 (four) times daily as needed for diarrhea or loose stools. 06/26/22   [name], MD  fluticasone (FLONASE) 50 MCG/ACT nasal spray Place 2 sprays into both nostrils daily. 01/07/20   Joy, Shawn C, PA-C  furosemide (LASIX) 20 MG tablet Take 20 mg by mouth daily as needed (fluid retention (feet swelling)). 07/03/20   [provider]  hyoscyamine (LEVSIN SL) 0.125 MG SL tablet Place 1 tablet (0.125 mg total) under  the tongue every 4 (four) hours as needed. 05/21/22   Mosie Lukes, MD  loperamide (IMODIUM) 2 MG capsule Take 1 capsule (2 mg total) by mouth 4 (four) times daily as needed for diarrhea or loose stools. 07/21/22   Elgie Congo, MD  losartan (COZAAR) 50 MG tablet Take 50 mg by mouth in the morning and at bedtime. 04/14/21   [provider]  Magnesium Glycinate 100 MG CAPS Take 400 mg by mouth at bedtime. 09/21/22   [provider]  metoprolol succinate (TOPROL-XL) 50 MG 24 hr tablet Take 1 tablet by mouth daily. 09/21/22   [provider]  oxyCODONE-acetaminophen (PERCOCET/ROXICET) 5-325 MG tablet Take 1 tablet by mouth every 6 (six) hours as needed for severe  pain. 08/25/22   Mosie Lukes, MD  potassium chloride SA (KLOR-CON M) 20 MEQ tablet Take 1 tablet (20 mEq total) by mouth daily. 09/01/22   Jackquline Denmark, MD  promethazine (PHENERGAN) 25 MG tablet Take 1 tablet (25 mg total) by mouth every 8 (eight) hours as needed for up to 15 doses for nausea or vomiting. 09/17/22   Deno Etienne, DO  spironolactone (ALDACTONE) 25 MG tablet Take 12.5 mg by mouth every evening.    [provider]  sucralfate (CARAFATE) 1 g tablet Take 1 tablet (1 g total) by mouth 4 (four) times daily -  with meals and at bedtime. 05/21/22   Mosie Lukes, MD      Allergies    Benazepril, Ondansetron hcl, Codeine, and Morphine    Review of Systems   Review of Systems  Constitutional:  Negative for chills.  Respiratory:  Negative for shortness of breath.   Cardiovascular:  Positive for chest pain. Negative for palpitations and leg swelling.  Neurological:  Positive for light-headedness.    Physical Exam Updated Vital Signs BP (!) 166/103 (BP Location: Left Arm)   Pulse 95   Temp 97.7 F (36.5 C) (Oral)   Resp 18   Ht '5\' 6"'$  (1.676 m)   Wt 88.1 kg   SpO2 98%   BMI 31.34 kg/m  Physical Exam Vitals and nursing note reviewed.  Constitutional:      General: She is not in acute distress.    Appearance: Normal appearance. She is not ill-appearing.  HENT:     Head: Normocephalic and atraumatic.     Nose: Nose normal.  Eyes:     General: No scleral icterus.    Extraocular Movements: Extraocular movements intact.     Conjunctiva/sclera: Conjunctivae normal.  Cardiovascular:     Rate and Rhythm: Normal rate and regular rhythm.     Pulses: Normal pulses.  Pulmonary:     Effort: Pulmonary effort is normal. No respiratory distress.     Breath sounds: Normal breath sounds. No wheezing or rales.  Abdominal:     General: There is no distension.     Palpations: Abdomen is soft.     Tenderness: There is no abdominal tenderness. There is no guarding.   Musculoskeletal:        General: Normal range of motion.     Cervical back: Normal range of motion.     Right lower leg: No edema.     Left lower leg: No edema.  Skin:    General: Skin is warm and dry.  Neurological:     General: No focal deficit present.     Mental Status: She is alert. Mental status is at baseline.     ED Results /  Procedures / Treatments   Labs (all labs ordered are listed, but only abnormal results are displayed) Labs Reviewed  CBC WITH DIFFERENTIAL/PLATELET - Abnormal; Notable for the following components:      Result Value   WBC 2.4 (*)    Platelets 110 (*)    Neutro Abs 0.9 (*)    All other components within normal limits  URINALYSIS, ROUTINE W REFLEX MICROSCOPIC - Abnormal; Notable for the following components:   Leukocytes,Ua SMALL (*)    All other components within normal limits  URINALYSIS, MICROSCOPIC (REFLEX) - Abnormal; Notable for the following components:   Bacteria, UA RARE (*)    All other components within normal limits  BASIC METABOLIC PANEL  LIPASE, BLOOD  MAGNESIUM  PATHOLOGIST SMEAR REVIEW  TROPONIN I (HIGH SENSITIVITY)  TROPONIN I (HIGH SENSITIVITY)    EKG EKG Interpretation  Date/Time:  Monday November 30 2022 16:49:41 EST Ventricular Rate:  97 PR Interval:  134 QRS Duration: 130 QT Interval:  432 QTC Calculation: 548 R Axis:   -68 Text Interpretation: Atrial-sensed ventricular-paced rhythm Abnormal ECG When compared with ECG of 17-Sep-2022 18:27, PREVIOUS ECG IS PRESENT No significant change since last tracing Confirmed by Dorie Rank [phone]) on 11/30/2022 5:25:08 PM  Radiology No results found.  Procedures Procedures    Medications Ordered in ED Medications  lactated ringers bolus 500 mL (has no administration in time range)    ED Course/ Medical Decision Making/ A&P Clinical Course as of 11/30/22 2212  Mon Nov 30, 2022  2116 Urinalysis, Routine w reflex microscopic Urine, Clean Catch(!) [AA]    Clinical  Course User Index [AA] Evlyn Courier, PA-C                             Medical Decision Making Amount and/or Complexity of Data Reviewed Labs: ordered. Decision-making details documented in ED Course.  Risk Prescription drug management.   Medical Decision Making / ED Course   This patient presents to the ED for concern of chest pain, diarrhea, this involves an extensive number of treatment options, and is a complaint that carries with it a high risk of complications and morbidity.  The differential diagnosis includes gastroenteritis, C. difficile, ACS, pneumonia, PE, MSK pain  MDM: 67 year old female presents today from PCP office for further evaluation of chest pain.  She states she went to the PCP office for evaluation of diarrhea which has been ongoing for about a week.  4 episodes per day.  No blood in stool.  Without melanotic stools.  Has been drinking plenty of fluids according to patient.  Denies abdominal pain, vomiting.  Denies dysuria.  She states chest pain was fleeting.  She describes it as a pressure-like sensation.  She was given aspirin and nitroglycerin in the doctor's office.  However chest pain has resolved prior to receiving the nitroglycerin.  She has had chest pain with similar episodes in the past.  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  Hemoglobin at baseline.  UA without evidence of UTI.  BMP with preserved renal function, potassium of 4.2.  Magnesium 1.9.  Troponin initially 10, increased to 12 on repeat.  Flat.  Low suspicion for ACS. Heart score of 3 (age, and risk factors).  EKG without acute ischemic changes.  Lipase within normal limits.  BP elevated.  Home BP meds given with improvement.  Denies vision change, balance issues, or dyspnea.  Patient does have history of HFrEF with EF of 25-30%.  Status post ICD placement.  500 fluid bolus given.  Large volume resuscitation avoided due to history of CHF. Patient on reevaluation following fluids reported improvement in  symptoms.  Discussed follow-up with her primary care provider to provide and complete the testing from the stool samples that were ordered from clinic today.  She has been taking Imodium.  Discussed she can continue taking the Imodium.  Discussed concerning symptoms that would warrant return to the emergency department.  Patient voices understanding and is in agreement with plan.  Case discussed with attending who is in agreement with plan.   Additional history obtained: -Additional history obtained from pcp visit from today -External records from outside source obtained and reviewed including: Chart review including previous notes, labs, imaging, consultation notes   Lab Tests: -I ordered, reviewed, and interpreted labs.   The pertinent results include:   Labs Reviewed  CBC WITH DIFFERENTIAL/PLATELET - Abnormal; Notable for the following components:      Result Value   WBC 2.4 (*)    Platelets 110 (*)    Neutro Abs 0.9 (*)    All other components within normal limits  URINALYSIS, ROUTINE W REFLEX MICROSCOPIC - Abnormal; Notable for the following components:   Leukocytes,Ua SMALL (*)    All other components within normal limits  URINALYSIS, MICROSCOPIC (REFLEX) - Abnormal; Notable for the following components:   Bacteria, UA RARE (*)    All other components within normal limits  BASIC METABOLIC PANEL  LIPASE, BLOOD  MAGNESIUM  PATHOLOGIST SMEAR REVIEW  TROPONIN I (HIGH SENSITIVITY)  TROPONIN I (HIGH SENSITIVITY)      EKG  EKG Interpretation  Date/Time:  Monday November 30 2022 16:49:41 EST Ventricular Rate:  97 PR Interval:  134 QRS Duration: 130 QT Interval:  432 QTC Calculation: 548 R Axis:   -68 Text Interpretation: Atrial-sensed ventricular-paced rhythm Abnormal ECG When compared with ECG of 17-Sep-2022 18:27, PREVIOUS ECG IS PRESENT No significant change since last tracing Confirmed by Dorie Rank [phone]) on 11/30/2022 5:25:08 PM         Medicines ordered and  prescription drug management: Meds ordered this encounter  Medications   DISCONTD: lactated ringers infusion   DISCONTD: lactated ringers bolus 1,000 mL    Order Specific Question:   Total Body Weight basis for 30 mL/kg  bolus delivery    Answer:   88.1 kg   DISCONTD: lactated ringers bolus 1,000 mL    Order Specific Question:   Total Body Weight basis for 30 mL/kg  bolus delivery    Answer:   88.1 kg   DISCONTD: lactated ringers bolus 1,000 mL    Order Specific Question:   Total Body Weight basis for 30 mL/kg  bolus delivery    Answer:   88.1 kg   lactated ringers bolus 500 mL   losartan (COZAAR) tablet 50 mg   metoprolol succinate (TOPROL-XL) 24 hr tablet 50 mg   spironolactone (ALDACTONE) tablet 12.5 mg    -I have reviewed the patients home medicines and have made adjustments as needed  Critical interventions Fluid resuscitation    Cardiac Monitoring: The patient was maintained on a cardiac monitor.  I personally viewed and interpreted the cardiac monitored which showed an underlying rhythm of: paced rhythm  Reevaluation: After the interventions noted above, I reevaluated the patient and found that they have :improved  Co morbidities that complicate the patient evaluation  Past Medical History:  Diagnosis Date   Abnormal SPEP 07/26/2016   Arthritis  knees, hands   Back pain 07/14/2016   Bowel obstruction ([place]) 11/2014   C. 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  Discharged in stable condition.  She will follow-up with PCP.  Discussed increased hydration.  Admission considered however patient with improved symptoms, workup overall reassuring without concern for ACS.   Final Clinical Impression(s) / ED Diagnoses Final diagnoses:  Atypical chest pain  Diarrhea, unspecified type    Rx / DC Orders ED Discharge Orders     None         Evlyn Courier, PA-C 11/30/22 2219    Dorie Rank, MD 12/01/22 [phone]

## 2022-11-30 NOTE — Assessment & Plan Note (Signed)
She notes chest discomfort started today. EKG shows a paced rhythm. Given NTG SL and aspirin 162 mg and transported to Emergency Room.

## 2022-12-01 ENCOUNTER — Telehealth: Payer: Self-pay | Admitting: Family Medicine

## 2022-12-01 NOTE — Telephone Encounter (Signed)
Pt has appt

## 2022-12-01 NOTE — Telephone Encounter (Signed)
Pt called wanting to make a follow up appt with Dr. Charlett Blake on her ED visit. Pt was advised that Dr. Charlett Blake did not have anything on paper until the end of April. Pt asked if a note could be sent back to look into getting her worked in. Please Advise.

## 2022-12-02 ENCOUNTER — Other Ambulatory Visit: Payer: 59

## 2022-12-02 DIAGNOSIS — R197 Diarrhea, unspecified: Secondary | ICD-10-CM

## 2022-12-03 LAB — OVA AND PARASITE EXAMINATION
CONCENTRATE RESULT:: NONE SEEN
MICRO NUMBER:: 14467037
SPECIMEN QUALITY:: ADEQUATE
TRICHROME RESULT:: NONE SEEN

## 2022-12-03 LAB — PATHOLOGIST SMEAR REVIEW

## 2022-12-04 ENCOUNTER — Telehealth: Payer: Self-pay | Admitting: *Deleted

## 2022-12-04 DIAGNOSIS — M129 Arthropathy, unspecified: Secondary | ICD-10-CM | POA: Diagnosis not present

## 2022-12-04 DIAGNOSIS — R109 Unspecified abdominal pain: Secondary | ICD-10-CM | POA: Diagnosis not present

## 2022-12-04 DIAGNOSIS — R5383 Other fatigue: Secondary | ICD-10-CM | POA: Diagnosis not present

## 2022-12-04 DIAGNOSIS — Z79899 Other long term (current) drug therapy: Secondary | ICD-10-CM | POA: Diagnosis not present

## 2022-12-04 DIAGNOSIS — Z0000 Encounter for general adult medical examination without abnormal findings: Secondary | ICD-10-CM | POA: Diagnosis not present

## 2022-12-04 DIAGNOSIS — E611 Iron deficiency: Secondary | ICD-10-CM | POA: Diagnosis not present

## 2022-12-04 DIAGNOSIS — R0602 Shortness of breath: Secondary | ICD-10-CM | POA: Diagnosis not present

## 2022-12-04 DIAGNOSIS — E785 Hyperlipidemia, unspecified: Secondary | ICD-10-CM | POA: Diagnosis not present

## 2022-12-04 DIAGNOSIS — Z711 Person with feared health complaint in whom no diagnosis is made: Secondary | ICD-10-CM | POA: Diagnosis not present

## 2022-12-04 DIAGNOSIS — R7309 Other abnormal glucose: Secondary | ICD-10-CM | POA: Diagnosis not present

## 2022-12-04 DIAGNOSIS — E559 Vitamin D deficiency, unspecified: Secondary | ICD-10-CM | POA: Diagnosis not present

## 2022-12-04 NOTE — Telephone Encounter (Signed)
Patient notified of what stool results we had back and that we will call next week with others.    She stated that she had back pain with no urinary symptoms.  Advised to go to urgent care and if gets worse over night go to ER.

## 2022-12-05 DIAGNOSIS — M549 Dorsalgia, unspecified: Secondary | ICD-10-CM | POA: Diagnosis not present

## 2022-12-05 DIAGNOSIS — N200 Calculus of kidney: Secondary | ICD-10-CM | POA: Diagnosis not present

## 2022-12-05 DIAGNOSIS — R110 Nausea: Secondary | ICD-10-CM | POA: Diagnosis not present

## 2022-12-06 LAB — STOOL CULTURE: E coli, Shiga toxin Assay: NEGATIVE

## 2022-12-07 ENCOUNTER — Other Ambulatory Visit: Payer: Self-pay

## 2022-12-07 ENCOUNTER — Telehealth: Payer: Self-pay | Admitting: Family Medicine

## 2022-12-07 DIAGNOSIS — N200 Calculus of kidney: Secondary | ICD-10-CM | POA: Diagnosis not present

## 2022-12-07 DIAGNOSIS — M549 Dorsalgia, unspecified: Secondary | ICD-10-CM | POA: Diagnosis not present

## 2022-12-07 MED ORDER — ALBUTEROL SULFATE HFA 108 (90 BASE) MCG/ACT IN AERS: 2.0000 | INHALATION_SPRAY | Freq: Four times a day (QID) | RESPIRATORY_TRACT | 5 refills | Status: DC | PRN

## 2022-12-07 NOTE — Telephone Encounter (Signed)
Patient needs a refill on her inhaler. Patient said she is short winded from being sick. Patient requesting prescription to be sent to [facility].

## 2022-12-07 NOTE — Telephone Encounter (Signed)
Refill sent.

## 2022-12-08 ENCOUNTER — Other Ambulatory Visit: Payer: Self-pay | Admitting: Family Medicine

## 2022-12-08 ENCOUNTER — Emergency Department (HOSPITAL_BASED_OUTPATIENT_CLINIC_OR_DEPARTMENT_OTHER): Payer: 59

## 2022-12-08 ENCOUNTER — Encounter (HOSPITAL_BASED_OUTPATIENT_CLINIC_OR_DEPARTMENT_OTHER): Payer: Self-pay

## 2022-12-08 ENCOUNTER — Emergency Department (HOSPITAL_BASED_OUTPATIENT_CLINIC_OR_DEPARTMENT_OTHER)
Admission: EM | Admit: 2022-12-08 | Discharge: 2022-12-08 | Disposition: A | Discharge status: Home / Self Care | Payer: 59 | Attending: Emergency Medicine | Admitting: Emergency Medicine

## 2022-12-08 ENCOUNTER — Ambulatory Visit: Payer: 59 | Admitting: Family Medicine

## 2022-12-08 ENCOUNTER — Other Ambulatory Visit: Payer: Self-pay

## 2022-12-08 DIAGNOSIS — Z8616 Personal history of COVID-19: Secondary | ICD-10-CM | POA: Diagnosis not present

## 2022-12-08 DIAGNOSIS — M545 Low back pain, unspecified: Secondary | ICD-10-CM | POA: Diagnosis not present

## 2022-12-08 DIAGNOSIS — Z7982 Long term (current) use of aspirin: Secondary | ICD-10-CM | POA: Insufficient documentation

## 2022-12-08 DIAGNOSIS — M5459 Other low back pain: Secondary | ICD-10-CM | POA: Diagnosis not present

## 2022-12-08 DIAGNOSIS — Z20822 Contact with and (suspected) exposure to covid-19: Secondary | ICD-10-CM

## 2022-12-08 LAB — URINALYSIS, ROUTINE W REFLEX MICROSCOPIC
Bilirubin Urine: NEGATIVE
Glucose, UA: NEGATIVE mg/dL
Ketones, ur: NEGATIVE mg/dL
Nitrite: NEGATIVE
Protein, ur: NEGATIVE mg/dL
Specific Gravity, Urine: >=1.03 (ref 1.005–1.030)
pH: 5.5 (ref 5.0–8.0)

## 2022-12-08 LAB — FECAL LACTOFERRIN, QUANT
Fecal Lactoferrin: NEGATIVE
MICRO NUMBER:: 14467154
SPECIMEN QUALITY:: ADEQUATE

## 2022-12-08 LAB — PROTEIN ELECTROPHORESIS, SERUM, WITH REFLEX
A/G Ratio: 0.7 (ref 0.7–1.7)
Albumin ELP: 3.4 g/dL (ref 2.9–4.4)
Alpha-1-Globulin: 0.2 g/dL (ref 0.0–0.4)
Alpha-2-Globulin: 0.9 g/dL (ref 0.4–1.0)
Beta Globulin: 1.1 g/dL (ref 0.7–1.3)
Gamma Globulin: 2.9 g/dL — ABNORMAL HIGH (ref 0.4–1.8)
Globulin, Total: 5.1 g/dL — ABNORMAL HIGH (ref 2.2–3.9)
M-Spike, %: 1.9 g/dL — ABNORMAL HIGH
SPEP Interpretation: 0
Total Protein ELP: 8.5 g/dL (ref 6.0–8.5)

## 2022-12-08 LAB — RESP PANEL BY RT-PCR (RSV, FLU A&B, COVID)  RVPGX2
Influenza A by PCR: NEGATIVE
Influenza B by PCR: NEGATIVE
Resp Syncytial Virus by PCR: NEGATIVE
SARS Coronavirus 2 by RT PCR: NEGATIVE

## 2022-12-08 LAB — IMMUNOFIXATION REFLEX, SERUM: IgA: 216 mg/dL (ref 87–352)

## 2022-12-08 LAB — URINALYSIS, MICROSCOPIC (REFLEX): RBC / HPF: NONE SEEN RBC/hpf (ref 0–5)

## 2022-12-08 LAB — C.DIFF TOXINB QL PCR: CLOSTRIDIUM DIFFICILE TOXINB,QL REAL TIME PCR: DETECTED — CR

## 2022-12-08 LAB — CLOSTRIDIUM DIFFICILE CULTURE-FECAL

## 2022-12-08 MED ORDER — VANCOMYCIN HCL 125 MG PO CAPS: 125.0000 mg | ORAL_CAPSULE | Freq: Four times a day (QID) | ORAL | 0 refills | Status: DC

## 2022-12-08 MED ORDER — METHOCARBAMOL 500 MG PO TABS: 1000.0000 mg | ORAL_TABLET | Freq: Every evening | ORAL | 0 refills | Status: DC | PRN

## 2022-12-08 MED ORDER — LIDOCAINE 5 % EX PTCH
1.0000 | MEDICATED_PATCH | CUTANEOUS | Status: DC
  Administered 2022-12-08: 1 via TRANSDERMAL
  Filled 2022-12-08: qty 1

## 2022-12-08 MED ORDER — LIDOCAINE 5 % EX PTCH: 1.0000 | MEDICATED_PATCH | CUTANEOUS | 0 refills | Status: DC

## 2022-12-08 NOTE — ED Notes (Signed)
Discharge paperwork reviewed entirely with patient, including Rx's and follow up care. Pain was under control. Pt verbalized understanding as well as all parties involved. No questions or concerns voiced at the time of discharge. No acute distress noted.   Pt ambulated out to PVA without incident or assistance.  

## 2022-12-08 NOTE — ED Provider Notes (Signed)
[facility] Provider Note   CSN: [id number] Arrival date & time: 12/08/22  1418     History  Chief Complaint  Patient presents with   Back Pain    Tami [name] is a 67 y.o. female.  Patient with history of myeloma followed by oncology presents for evaluation of back pain and also check for COVID.  She is concerned about COVID because she had a recent close contact but is otherwise asymptomatic.  Her back pain has been going on about 1 week.  She has had back pain from time to time.  Pain is in the mid to lower back and moves to the left side.  She denies associated dysuria, hematuria, increased frequency or urgency.  No anterior abdominal pain.  Pain is worse with movement and palpation.  She denies falls or injuries.  She has leftover Percocet and sometimes when she is having severe pain, she will take 1.  She has taken this once since the pain started.  She also reports decreased urination.  Creatinine and calcium checked on 1/22 are both normal.  She had an x-ray of her lower back last year which was negative.  No history of disease in the spine or chronic kidney disease.       Home Medications Prior to Admission medications   Medication Sig Start Date End Date Taking? Authorizing Provider  acetaminophen (TYLENOL) 500 MG tablet Take 500 mg by mouth in the morning.    [provider]  acyclovir (ZOVIRAX) 400 MG tablet TAKE ONE (1) TABLET BY MOUTH TWO (2) TIMES DAILY 08/29/22   Ennever, Rudell Cobb, MD  albuterol (VENTOLIN HFA) 108 (90 Base) MCG/ACT inhaler Inhale 2 puffs into the lungs every 6 (six) hours as needed for wheezing or shortness of breath. 12/07/22   Mosie Lukes, MD  aspirin EC 81 MG tablet Take 1 tablet (81 mg total) by mouth daily. Patient taking differently: Take 81 mg by mouth every evening. 02/15/18   Mosie Lukes, MD  cetirizine (ZYRTEC) 10 MG tablet Take 10 mg by mouth at bedtime.    [provider]   Cholecalciferol (VITAMIN D3) 50 MCG (2000 UT) TABS Take 2,000 Units by mouth in the morning.    [provider]  colestipol (COLESTID) 1 g tablet Take 3-4 tablets (3-4 g total) by mouth every 12 (twelve) hours. Patient taking differently: Take 2 g by mouth every 12 (twelve) hours. 10/13/22   Mosie Lukes, MD  dicyclomine (BENTYL) 10 MG capsule Take 1 capsule (10 mg total) by mouth 4 (four) times daily -  before meals and at bedtime. 10/13/22 01/11/23  Mosie Lukes, MD  diphenoxylate-atropine (LOMOTIL) 2.5-0.025 MG tablet Take 1 tablet by mouth 4 (four) times daily as needed for diarrhea or loose stools. 06/26/22   [name], MD  fluticasone (FLONASE) 50 MCG/ACT nasal spray Place 2 sprays into both nostrils daily. 01/07/20   Joy, Shawn C, PA-C  furosemide (LASIX) 20 MG tablet Take 20 mg by mouth daily as needed (fluid retention (feet swelling)). 07/03/20   [provider]  hyoscyamine (LEVSIN SL) 0.125 MG SL tablet Place 1 tablet (0.125 mg total) under the tongue every 4 (four) hours as needed. 05/21/22   Mosie Lukes, MD  loperamide (IMODIUM) 2 MG capsule Take 1 capsule (2 mg total) by mouth 4 (four) times daily as needed for diarrhea or loose stools. 07/21/22   Elgie Congo, MD  losartan (COZAAR) 50 MG tablet Take 50 mg by mouth in the morning and at bedtime. 04/14/21   [provider]  Magnesium Glycinate 100 MG CAPS Take 400 mg by mouth at bedtime. 09/21/22   [provider]  metoprolol succinate (TOPROL-XL) 50 MG 24 hr tablet Take 1 tablet by mouth daily. 09/21/22   [provider]  oxyCODONE-acetaminophen (PERCOCET/ROXICET) 5-325 MG tablet Take 1 tablet by mouth every 6 (six) hours as needed for severe pain. 08/25/22   Mosie Lukes, MD  potassium chloride SA (KLOR-CON M) 20 MEQ tablet Take 1 tablet (20 mEq total) by mouth daily. 09/01/22   Jackquline Denmark, MD  promethazine (PHENERGAN) 25 MG tablet Take 1 tablet (25 mg total) by mouth  every 8 (eight) hours as needed for up to 15 doses for nausea or vomiting. 09/17/22   Deno Etienne, DO  spironolactone (ALDACTONE) 25 MG tablet Take 12.5 mg by mouth every evening.    [provider]  sucralfate (CARAFATE) 1 g tablet Take 1 tablet (1 g total) by mouth 4 (four) times daily -  with meals and at bedtime. 05/21/22   Mosie Lukes, MD      Allergies    Benazepril, Ondansetron hcl, Codeine, and Morphine    Review of Systems   Review of Systems  Physical Exam Updated Vital Signs BP (!) 155/106 (BP Location: Left Arm)   Pulse 81   Temp 98.2 F (36.8 C) (Oral)   Resp 20   SpO2 98%   Physical Exam Vitals and nursing note reviewed.  Constitutional:      Appearance: She is well-developed.  HENT:     Head: Normocephalic and atraumatic.  Eyes:     Conjunctiva/sclera: Conjunctivae normal.  Pulmonary:     Effort: Pulmonary effort is normal.  Abdominal:     Palpations: Abdomen is soft.     Tenderness: There is no abdominal tenderness.  Musculoskeletal:        General: Normal range of motion.     Cervical back: Normal range of motion and neck supple. No tenderness or bony tenderness.     Thoracic back: No tenderness or bony tenderness.     Lumbar back: Tenderness present. No bony tenderness. Normal range of motion.     Comments: No step-off noted with palpation of spine.  Tenderness to palpation lower lumbar spine, paraspinous, left lower back as well.  Skin:    General: Skin is warm and dry.     Findings: No rash.  Neurological:     Mental Status: She is alert.     Sensory: No sensory deficit.     Deep Tendon Reflexes: Reflexes are normal and symmetric.     Comments: 5/5 strength in entire lower extremities bilaterally. No sensation deficit.      ED Results / Procedures / Treatments   Labs (all labs ordered are listed, but only abnormal results are displayed) Labs Reviewed  URINALYSIS, ROUTINE W REFLEX MICROSCOPIC - Abnormal; Notable for the following  components:      Result Value   Hgb urine dipstick TRACE (*)    Leukocytes,Ua TRACE (*)    All other components within normal limits  URINALYSIS, MICROSCOPIC (REFLEX) - Abnormal; Notable for the following components:   Bacteria, UA RARE (*)    All other components within normal limits  RESP PANEL BY RT-PCR (RSV, FLU A&B, COVID)  [id number]    EKG None  Radiology DG Lumbar Spine Complete  Result Date: 12/08/2022 CLINICAL DATA:  low back pain, h/o multiple myeloma EXAM: LUMBAR SPINE - COMPLETE 4+ VIEW COMPARISON:  CT 07/21/2022 FINDINGS: Slight levoconvex lumbar curvature. There is no evidence of lumbar spine fracture. There is mild to moderate multilevel degenerative disc disease most prominent L4-L5 with borderline grade 2 anterolisthesis at L4-L5. There is moderate lower lumbar predominant facet arthropathy. IMPRESSION: No evidence of lumbar spine fracture. 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 Moderate lower lumbar predominant facet arthropathy. Electronically Signed   By: Maurine Simmering M.D.   On: 12/08/2022 16:03    Procedures Procedures    Medications Ordered in ED Medications  lidocaine (LIDODERM) 5 % 1 patch (1 patch Transdermal Patch Applied 12/08/22 1605)    ED Course/ Medical Decision Making/ A&P    Patient seen and examined. History obtained directly from patient.   Labs/EKG: Ordered respiratory panel, UA.  Imaging: Ordered lumbar spine x-ray.  Medications/Fluids: Ordered: Lidoderm patch.   Most recent vital signs reviewed and are as follows: BP (!) 155/106 (BP Location: Left Arm)   Pulse 81   Temp 98.2 F (36.8 C) (Oral)   Resp 20   SpO2 98%   Initial impression: Patient with lower back pain in setting myeloma, no red flags other than myeloma history.  5:22 PM Reassessment performed. Patient appears stable.  Labs personally reviewed and interpreted including: UA with out signs  of infection or blood  Imaging personally visualized and interpreted including:  Reviewed pertinent lab work and imaging with patient at bedside. Questions answered.   Most current vital signs reviewed and are as follows: BP (!) 155/106 (BP Location: Left Arm)   Pulse 81   Temp 98.2 F (36.8 C) (Oral)   Resp 20   SpO2 98%   Plan: Discharge to home.   Prescriptions written for: Robaxin, Lidoderm  Other home care instructions discussed: Rest, no heavy lifting pushing or pulling  ED return instructions discussed: Return with uncontrolled pain, urinary incontinence, new symptoms or other concerns  Follow-up instructions discussed: Patient encouraged to follow-up with their PCP in 7 days.                             Medical Decision Making Amount and/or Complexity of Data Reviewed Labs: ordered. Radiology: ordered.  Risk Prescription drug management.   Patient with back pain. No neurological deficits. Patient is ambulatory. No warning symptoms of back pain including: fecal incontinence, urinary retention or overflow incontinence, night sweats, waking from sleep with back pain, unexplained fevers or weight loss, IVDU, recent trauma. No concern for cauda equina, epidural abscess, or other serious cause of back pain. X-ray is negative. Conservative measures such as rest, ice/heat and pain medicine indicated with PCP follow-up if no improvement with conservative management.   The patient's vital signs, pertinent lab work and imaging were reviewed and interpreted as discussed in the ED course. Hospitalization was considered for further testing, treatments, or serial exams/observation. However as patient is well-appearing, has a stable exam, and reassuring studies today, I do not feel that they warrant admission at this time. This plan was discussed with the patient who verbalizes agreement and comfort with this plan and seems reliable and able to return to the Emergency Department with  worsening or changing symptoms.           Final Clinical Impression(s) / ED Diagnoses Final diagnoses:  Acute right-sided low back pain without sciatica  Close exposure to COVID-19 virus    Rx / DC Orders ED Discharge Orders          Ordered    methocarbamol (ROBAXIN) 500 MG tablet  At bedtime PRN        12/08/22 1720    lidocaine (LIDODERM) 5 %  Every 24 hours        12/08/22 1720              Carlisle Cater, PA-C 12/08/22 1723    Long, Wonda Olds, MD 12/08/22 2326

## 2022-12-08 NOTE — ED Notes (Signed)
Advised patient she needs to provide a urine sample

## 2022-12-08 NOTE — Discharge Instructions (Signed)
Please read and follow all provided instructions.  Your diagnoses today include:  1. Acute right-sided low back pain without sciatica   2. Close exposure to COVID-19 virus     Tests performed today include: Vital signs - see below for your results today X-ray of the lower back: shows some arthritis but no signs of fracture or other problems with the bones Urine test: No signs of infection or blood in your urine COVID, flu, RSV testing: Were negative  Medications prescribed:  Robaxin (methocarbamol) - muscle relaxer medication  DO NOT drive or perform any activities that require you to be awake and alert because this medicine can make you drowsy.   Take any prescribed medications only as directed.  Home care instructions:  Follow any educational materials contained in this packet Please rest, use ice or heat on your back for the next several days Do not lift, push, pull anything more than 10 pounds for the next week  Follow-up instructions: Please follow-up with your primary care provider in the next 1 week for further evaluation of your symptoms.   Return instructions:  SEEK IMMEDIATE MEDICAL ATTENTION IF YOU HAVE: New numbness, tingling, weakness, or problem with the use of your arms or legs Severe back pain not relieved with medications Loss control of your bowels or bladder Increasing pain in any areas of the body (such as chest or abdominal pain) Shortness of breath, dizziness, or fainting.  Worsening nausea (feeling sick to your stomach), vomiting, fever, or sweats Any other emergent concerns regarding your health   Additional Information:  Your vital signs today were: BP (!) 155/106 (BP Location: Left Arm)   Pulse 81   Temp 98.2 F (36.8 C) (Oral)   Resp 20   SpO2 98%  If your blood pressure (BP) was elevated above 135/85 this visit, please have this repeated by your doctor within one month. --------------

## 2022-12-08 NOTE — ED Triage Notes (Signed)
Pt presents with complaints of lower back pain x 1 week. Radiating left side. No injury reported. Also wants covid test due to exposure

## 2022-12-09 ENCOUNTER — Ambulatory Visit: Payer: 59 | Admitting: Family

## 2022-12-09 ENCOUNTER — Other Ambulatory Visit: Payer: Self-pay | Admitting: *Deleted

## 2022-12-09 DIAGNOSIS — Z8619 Personal history of other infectious and parasitic diseases: Secondary | ICD-10-CM

## 2022-12-09 DIAGNOSIS — M545 Low back pain, unspecified: Secondary | ICD-10-CM | POA: Diagnosis not present

## 2022-12-09 NOTE — Progress Notes (Signed)
Patient notified of results.  She will pickup medication.  Advised after she finishes to come back to pickup cup for a repeat c-diff.  C-diff future order placed.

## 2022-12-10 DIAGNOSIS — G4733 Obstructive sleep apnea (adult) (pediatric): Secondary | ICD-10-CM | POA: Diagnosis not present

## 2022-12-12 DIAGNOSIS — R0989 Other specified symptoms and signs involving the circulatory and respiratory systems: Secondary | ICD-10-CM | POA: Diagnosis not present

## 2022-12-12 DIAGNOSIS — M79605 Pain in left leg: Secondary | ICD-10-CM | POA: Diagnosis not present

## 2022-12-12 DIAGNOSIS — Z1231 Encounter for screening mammogram for malignant neoplasm of breast: Secondary | ICD-10-CM | POA: Diagnosis not present

## 2022-12-12 DIAGNOSIS — I509 Heart failure, unspecified: Secondary | ICD-10-CM | POA: Diagnosis not present

## 2022-12-12 DIAGNOSIS — Z79899 Other long term (current) drug therapy: Secondary | ICD-10-CM | POA: Diagnosis not present

## 2022-12-12 DIAGNOSIS — E611 Iron deficiency: Secondary | ICD-10-CM | POA: Diagnosis not present

## 2022-12-12 DIAGNOSIS — M79604 Pain in right leg: Secondary | ICD-10-CM | POA: Diagnosis not present

## 2022-12-12 DIAGNOSIS — C9000 Multiple myeloma not having achieved remission: Secondary | ICD-10-CM | POA: Diagnosis not present

## 2022-12-12 DIAGNOSIS — M545 Low back pain, unspecified: Secondary | ICD-10-CM | POA: Diagnosis not present

## 2022-12-12 DIAGNOSIS — E785 Hyperlipidemia, unspecified: Secondary | ICD-10-CM | POA: Diagnosis not present

## 2022-12-12 DIAGNOSIS — E559 Vitamin D deficiency, unspecified: Secondary | ICD-10-CM | POA: Diagnosis not present

## 2022-12-15 ENCOUNTER — Other Ambulatory Visit: Payer: Self-pay | Admitting: Family Medicine

## 2022-12-15 ENCOUNTER — Ambulatory Visit (INDEPENDENT_AMBULATORY_CARE_PROVIDER_SITE_OTHER): Payer: 59 | Admitting: Family Medicine

## 2022-12-15 VITALS — BP 142/86 | HR 93 | Temp 98.0°F | Resp 16 | Ht 66.0 in | Wt 194.0 lb

## 2022-12-15 DIAGNOSIS — I10 Essential (primary) hypertension: Secondary | ICD-10-CM | POA: Diagnosis not present

## 2022-12-15 DIAGNOSIS — I5041 Acute combined systolic (congestive) and diastolic (congestive) heart failure: Secondary | ICD-10-CM

## 2022-12-15 DIAGNOSIS — R739 Hyperglycemia, unspecified: Secondary | ICD-10-CM

## 2022-12-15 DIAGNOSIS — E860 Dehydration: Secondary | ICD-10-CM

## 2022-12-15 DIAGNOSIS — J45909 Unspecified asthma, uncomplicated: Secondary | ICD-10-CM

## 2022-12-15 DIAGNOSIS — R350 Frequency of micturition: Secondary | ICD-10-CM | POA: Diagnosis not present

## 2022-12-15 DIAGNOSIS — M545 Low back pain, unspecified: Secondary | ICD-10-CM | POA: Diagnosis not present

## 2022-12-15 DIAGNOSIS — A0471 Enterocolitis due to Clostridium difficile, recurrent: Secondary | ICD-10-CM | POA: Diagnosis not present

## 2022-12-15 DIAGNOSIS — A0472 Enterocolitis due to Clostridium difficile, not specified as recurrent: Secondary | ICD-10-CM

## 2022-12-15 MED ORDER — VANCOMYCIN HCL 125 MG PO CAPS: ORAL_CAPSULE | ORAL | 0 refills | Status: DC

## 2022-12-15 MED ORDER — VANCOMYCIN HCL 125 MG PO CAPS: 125.0000 mg | ORAL_CAPSULE | Freq: Every day | ORAL | 1 refills | Status: DC

## 2022-12-15 MED ORDER — ALBUTEROL SULFATE (2.5 MG/3ML) 0.083% IN NEBU: 2.5000 mg | INHALATION_SOLUTION | Freq: Four times a day (QID) | RESPIRATORY_TRACT | 2 refills | Status: DC | PRN

## 2022-12-15 NOTE — Progress Notes (Signed)
Subjective:   By signing my name below, I, [name], attest that this documentation has been prepared under the direction and in the presence of Mosie Lukes, MD., 12/15/2022.     [name], female    DOB: Nov 30, 1955, 68 y.o.   MRN: [id number]  Chief Complaint  Patient presents with   Follow-up    Follow up   HPI Patient is in today for an office visit. She denies CP/palpitations/SOB/HA/congestion/ fever/chills.  Asthma Patient is using her Albuterol inhaler approximately 3 times weekly to manage her asthma and is requesting a refill on this. She is interested in following with pulmonology to manage her asthma.  Clostridoides Difficile Infection (C.diff) Patient reports she has had C.diff 3 times in the past 2 years and has been having inconsistent bowel movements. Her stools are loose and watery, but there is no blood. She complains of nausea when having bowel movements and weakness. She was prescribed Bactrim in the past while in the ER to treat a UTI and suspects this could have caused her to have C.diff. She is currently on a 14-day regimen of Vancomycin 125 mg and will continue taking this until she is seen by infectious disease specialist Dr. Baxter Flattery. She is also interested in receiving a fecal transplant.   Lower Back Pain Patient complains of intermittent lower back pain that has been present since 11/2022 and has been taking Cyclobenzaprine to manage this. Denies associated dysuria or hematuria. She was admitted to the ED on 12/08/2022 due to this back pain and an x-ray of the lumbar spine was ordered which revealed no evidence of lumbar spine fracture, but mild to moderate multilevel degenerative disc disease worse at L4-L5 with borderline grade 2 anterolisthesis at this level and also moderate lower lumbar predominant facet arthropathy. She is interested in following with [facility] to manage this pain.  Past Medical History:  Diagnosis Date    Abnormal SPEP 07/26/2016   Arthritis    knees, hands   Back pain 07/14/2016   Bowel obstruction ([place]) 11/2014   C. 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   Multiple myeloma ([place]) 07/20/2018   Multiple myeloma not having achieved remission ([place]) 07/20/2018   Myalgia 12/31/2016   Obstructive sleep apnea 03/31/2015   Presence of permanent cardiac pacemaker    SOB (shortness of breath) 04/09/2016   Stroke ([place])    TIAs   UTI (urinary tract infection)     Past Surgical History:  Procedure Laterality Date   ABDOMINAL HYSTERECTOMY     menorraghia, 2006, total   BIOPSY  09/01/2022   Procedure: BIOPSY;  Surgeon: Jackquline Denmark, MD;  Location: [facility];  Service: Gastroenterology;;   CHOLECYSTECTOMY     COLONOSCOPY  2018   [facility] per patient   COLONOSCOPY WITH PROPOFOL N/A 09/01/2022   Procedure: COLONOSCOPY WITH PROPOFOL;  Surgeon: Jackquline Denmark, MD;  Location: [facility];  Service: Gastroenterology;  Laterality: N/A;   ESOPHAGOGASTRODUODENOSCOPY  2018   [facility]  ESOPHAGOGASTRODUODENOSCOPY (EGD) WITH PROPOFOL N/A 09/01/2022   Procedure: ESOPHAGOGASTRODUODENOSCOPY (EGD) WITH PROPOFOL;  Surgeon: Jackquline Denmark, MD;  Location: [facility];  Service: Gastroenterology;  Laterality: N/A;   KNEE SURGERY     right, repair torn torn cartialage   PACEMAKER INSERTION  08/2014   TONSILLECTOMY     TUBAL LIGATION      Family History  Problem Relation Age of Onset   Diabetes Mother    Hypertension  Mother    Heart disease Mother        s/p 1 stent   Hyperlipidemia Mother    Arthritis Mother    Cancer Father        COLON   Colon cancer Father 15   Irritable bowel syndrome Sister    Hyperlipidemia Daughter    Hypertension Daughter    Hypertension Maternal Grandmother    Arthritis Maternal Grandmother    Heart disease Maternal Grandfather        MI   Hypertension Maternal Grandfather    Arthritis Maternal Grandfather    Hypertension Son    Esophageal cancer Neg Hx     Social History   Socioeconomic History   Marital status: Divorced    Spouse name: [name]   Number of children: 3   Years of education: [name]   Highest education level: High school graduate  Occupational History   [name]  Tobacco Use   Smoking status: Never   Smokeless tobacco: Never  Vaping Use   Vaping Use: Never used  Substance and Sexual Activity   Alcohol use: No   Drug use: No   Sexual activity: Not Currently  Other Topics Concern   [name]  Social History Narrative   Lives at home alone   Retired   Caffeine: coffee   Social Determinants of Health   Financial Resource Strain: Low Risk  (10/17/2021)   Overall Financial Resource Strain (CARDIA)    Difficulty of Paying Living Expenses: Not hard at all  Food Insecurity: No Food Insecurity (10/17/2021)   Hunger Vital Sign    Worried About Running Out of Food in the Last Year: Never true    Ran Out of Food in the Last Year: Never true  Transportation Needs: No Transportation Needs (10/17/2021)   PRAPARE - Hydrologist (Medical): No    Lack of Transportation (Non-Medical): No  Physical Activity: Insufficiently Active (10/17/2021)   Exercise Vital Sign    Days of Exercise per Week: 2 days    Minutes of Exercise per Session: 30 min  Stress: No Stress Concern Present (10/17/2021)   [place]    Feeling of Stress : Not at all   Social Connections: Moderately Isolated (10/17/2021)   Social Connection and Isolation Panel [NHANES]    Frequency of Communication with Friends and Family: More than three times a week    Frequency of Social Gatherings with Friends and Family: More than three times a week    Attends Religious Services: More than 4 times per year    Active Member of [employer] or Organizations: No    Attends Archivist Meetings: Never    Marital Status: Divorced  Human resources officer Violence: Not At Risk (10/17/2021)   Humiliation, Afraid, Rape, and Kick questionnaire    Fear of Current or Ex-Partner: No    Emotionally Abused: No    Physically Abused: No    Sexually Abused: No  Outpatient Medications Prior to Visit  Medication Sig Dispense Refill   acetaminophen (TYLENOL) 500 MG tablet Take 500 mg by mouth in the morning.     acyclovir (ZOVIRAX) 400 MG tablet TAKE ONE (1) TABLET BY MOUTH TWO (2) TIMES DAILY 60 tablet 2   albuterol (VENTOLIN HFA) 108 (90 Base) MCG/ACT inhaler Inhale 2 puffs into the lungs every 6 (six) hours as needed for wheezing or shortness of breath. 18 g 5   aspirin EC 81 MG tablet Take 1 tablet (81 mg total) by mouth daily. (Patient taking differently: Take 81 mg by mouth every evening.) 90 tablet 1   cetirizine (ZYRTEC) 10 MG tablet Take 10 mg by mouth at bedtime.     Cholecalciferol (VITAMIN D3) 50 MCG (2000 UT) TABS Take 2,000 Units by mouth in the morning.     colestipol (COLESTID) 1 g tablet Take 3-4 tablets (3-4 g total) by mouth every 12 (twelve) hours. (Patient taking differently: Take 2 g by mouth every 12 (twelve) hours.) 240 tablet 3   dicyclomine (BENTYL) 10 MG capsule Take 1 capsule (10 mg total) by mouth 4 (four) times daily -  before meals and at bedtime. 120 capsule 2   diphenoxylate-atropine (LOMOTIL) 2.5-0.025 MG tablet Take 1 tablet by mouth 4 (four) times daily as needed for diarrhea or loose stools. 100 tablet 0   fluticasone (FLONASE) 50 MCG/ACT nasal spray  Place 2 sprays into both nostrils daily. 16 g 0   furosemide (LASIX) 20 MG tablet Take 20 mg by mouth daily as needed (fluid retention (feet swelling)).     hyoscyamine (LEVSIN SL) 0.125 MG SL tablet Place 1 tablet (0.125 mg total) under the tongue every 4 (four) hours as needed. 30 tablet 1   lidocaine (LIDODERM) 5 % Place 1 patch onto the skin daily. Remove & Discard patch within 12 hours or as directed by MD 15 patch 0   loperamide (IMODIUM) 2 MG capsule Take 1 capsule (2 mg total) by mouth 4 (four) times daily as needed for diarrhea or loose stools. 12 capsule 0   losartan (COZAAR) 50 MG tablet Take 50 mg by mouth in the morning and at bedtime.     Magnesium Glycinate 100 MG CAPS Take 400 mg by mouth at bedtime.     methocarbamol (ROBAXIN) 500 MG tablet Take 2 tablets (1,000 mg total) by mouth at bedtime as needed for muscle spasms. 20 tablet 0   metoprolol succinate (TOPROL-XL) 50 MG 24 hr tablet Take 1 tablet by mouth daily.     oxyCODONE-acetaminophen (PERCOCET/ROXICET) 5-325 MG tablet Take 1 tablet by mouth every 6 (six) hours as needed for severe pain. 30 tablet 0   potassium chloride SA (KLOR-CON M) 20 MEQ tablet Take 1 tablet (20 mEq total) by mouth daily. 30 tablet 2   promethazine (PHENERGAN) 25 MG tablet Take 1 tablet (25 mg total) by mouth every 8 (eight) hours as needed for up to 15 doses for nausea or vomiting. 15 tablet 0   spironolactone (ALDACTONE) 25 MG tablet Take 12.5 mg by mouth every evening.     sucralfate (CARAFATE) 1 g tablet Take 1 tablet (1 g total) by mouth 4 (four) times daily -  with meals and at bedtime. 120 tablet 2   vancomycin (VANCOCIN) 125 MG capsule Take 1 capsule (125 mg total) by mouth 4 (four) times daily for 14 days. 56 capsule 0   Facility-Administered Medications Prior to Visit  Medication Dose Route Frequency Provider Last Rate Last  Admin   aspirin chewable tablet 162 mg  162 mg Oral Once Mosie Lukes, MD       nitroGLYCERIN (NITROSTAT) SL tablet  0.4 mg  0.4 mg Sublingual Once Mosie Lukes, MD        Allergies  Allergen Reactions   Benazepril Anaphylaxis, Swelling and Hives    angioedema Throat and lip swelling   Ondansetron Hcl Hives    Redness and hives post IV admin on 07/05/17   Codeine Nausea And Vomiting   Morphine Hives    Redness and hives noted post IV admin on 07/05/17    Review of Systems  Constitutional:  Negative for chills and fever.  HENT:  Negative for congestion.   Respiratory:  Negative for shortness of breath.   Cardiovascular:  Negative for chest pain and palpitations.  Gastrointestinal:  Positive for diarrhea and nausea. Negative for blood in stool and constipation.  Genitourinary:  Negative for dysuria and hematuria.  Musculoskeletal:  Positive for back pain (lower back pain).  Skin:           Neurological:  Negative for headaches.       Objective:    Physical Exam Constitutional:      General: She is not in acute distress.    Appearance: Normal appearance. She is normal weight. She is not ill-appearing.  HENT:     Head: Normocephalic and atraumatic.     Right Ear: External ear normal.     Left Ear: External ear normal.     Nose: Nose normal.     Mouth/Throat:     Mouth: Mucous membranes are dry.     Pharynx: Oropharynx is clear.  Eyes:     General:        Right eye: No discharge.        Left eye: No discharge.     Extraocular Movements: Extraocular movements intact.     Conjunctiva/sclera: Conjunctivae normal.     Pupils: Pupils are equal, round, and reactive to light.  Cardiovascular:     Rate and Rhythm: Normal rate and regular rhythm.     Pulses: Normal pulses.     Heart sounds: Normal heart sounds. No murmur heard.    No gallop.  Pulmonary:     Effort: Pulmonary effort is normal. No respiratory distress.     Breath sounds: Normal breath sounds. No wheezing or rales.  Abdominal:     General: Bowel sounds are normal.     Palpations: Abdomen is soft.     Tenderness: There  is no abdominal tenderness. There is no guarding.  Musculoskeletal:        General: Normal range of motion.     Cervical back: Normal range of motion.     Right lower leg: No edema.     Left lower leg: No edema.  Skin:    General: Skin is warm and dry.  Neurological:     Mental Status: She is alert and oriented to person, place, and time.  Psychiatric:        Mood and Affect: Mood normal.        Behavior: Behavior normal.        Judgment: Judgment normal.     BP (!) 142/86 (BP Location: Right Arm, Patient Position: Sitting, Cuff Size: Normal)   Pulse 93   Temp 98 F (36.7 C) (Oral)   Resp 16   Ht 5' 6"$  (1.676 m)   Wt 194 lb (88 kg)   SpO2  97%   BMI 31.31 kg/m  Wt Readings from Last 3 Encounters:  12/15/22 194 lb (88 kg)  11/30/22 194 lb 3.2 oz (88.1 kg)  11/30/22 194 lb 3.2 oz (88.1 kg)    Diabetic Foot Exam - Simple   No data filed    Lab Results  Component Value Date   WBC 2.8 (L) 12/18/2022   HGB 11.6 (L) 12/18/2022   HCT 36.8 12/18/2022   PLT 109 (L) 12/18/2022   GLUCOSE 97 12/18/2022   CHOL 171 04/21/2022   TRIG 105.0 04/21/2022   HDL 49.40 04/21/2022   LDLCALC 100 (H) 04/21/2022   ALT 15 12/18/2022   AST 23 12/18/2022   NA 135 12/18/2022   K 4.0 12/18/2022   CL 105 12/18/2022   CREATININE 0.70 12/18/2022   BUN 19 12/18/2022   CO2 24 12/18/2022   TSH 1.97 04/21/2022   INR 0.90 07/28/2018   HGBA1C 5.8 04/21/2022    Lab Results  Component Value Date   TSH 1.97 04/21/2022   Lab Results  Component Value Date   WBC 2.8 (L) 12/18/2022   HGB 11.6 (L) 12/18/2022   HCT 36.8 12/18/2022   MCV 90.0 12/18/2022   PLT 109 (L) 12/18/2022   Lab Results  Component Value Date   NA 135 12/18/2022   K 4.0 12/18/2022   CHLORIDE 108 12/31/2016   CO2 24 12/18/2022   GLUCOSE 97 12/18/2022   BUN 19 12/18/2022   CREATININE 0.70 12/18/2022   BILITOT 0.5 12/18/2022   ALKPHOS 47 12/18/2022   AST 23 12/18/2022   ALT 15 12/18/2022   PROT 8.4 (H) 12/18/2022    ALBUMIN 3.0 (L) 12/18/2022   CALCIUM 8.5 (L) 12/18/2022   ANIONGAP 6 12/18/2022   EGFR >90 12/31/2016   GFR 89.84 10/13/2022   Lab Results  Component Value Date   CHOL 171 04/21/2022   Lab Results  Component Value Date   HDL 49.40 04/21/2022   Lab Results  Component Value Date   LDLCALC 100 (H) 04/21/2022   Lab Results  Component Value Date   TRIG 105.0 04/21/2022   Lab Results  Component Value Date   CHOLHDL 3 04/21/2022   Lab Results  Component Value Date   HGBA1C 5.8 04/21/2022      Assessment & Plan:  Asthma: Albuterol 2.5 mg/3 mL 0.083% nebulizer solution refilled today.  C.diff: Referral placed to infectious disease specialist Dr. Carlyle Basques and Vancomycin 125 mg refilled.  Lower Back Pain: Referral placed to [facility]. Problem List Items Addressed This Visit     Acute combined systolic and diastolic heart failure (HCC)    No recent exacerbation, no changes      Asthma    She is noting increasing SOB and wheezing. Using Albuterol roughly 3 x weekly will refer to pulmonology for further evaluation      Relevant Medications   albuterol (PROVENTIL) (2.5 MG/3ML) 0.083% nebulizer solution   Other Relevant Orders   Ambulatory referral to Pulmonology   Clostridium difficile diarrhea    Improving some on Vancomycin but this is her 3rd course so is referred infectious disease.      Dehydration    Encoaurged increased hydration with pediatlyte, gatorade, water etc. Notify us if unable to tolerate increased hydration      Hyperglycemia    hgba1c acceptable, minimize simple carbs. Increase exercise as tolerated.       Hypertension    Well controlled, no changes to meds. Encouraged heart healthy diet such as  the DASH diet and exercise as tolerated.        Low back pain   Relevant Orders   Ambulatory referral to Orthopedic Surgery   Urinary frequency    Was treated with bactrim by an urgent care secondary to UTI prior to her CDiff infection  returnin      Other Visit Diagnoses     Recurrent colitis due to Clostridium difficile    -  Primary   Relevant Orders   Ambulatory referral to Infectious Disease      Meds ordered this encounter  Medications   albuterol (PROVENTIL) (2.5 MG/3ML) 0.083% nebulizer solution    Sig: Take 3 mLs (2.5 mg total) by nebulization every 6 (six) hours as needed for wheezing or shortness of breath.    Dispense:  150 mL    Refill:  2   DISCONTD: vancomycin (VANCOCIN) 125 MG capsule    Sig: Take 1 capsule (125 mg total) by mouth 3 (three) times daily for 7 days, THEN 1 capsule (125 mg total) 2 (two) times daily for 7 days, THEN 1 capsule (125 mg total) daily for 14 days.    Dispense:  49 capsule    Refill:  0   DISCONTD: vancomycin (VANCOCIN) 125 MG capsule    Sig: Take 1 capsule (125 mg total) by mouth daily.    Dispense:  30 capsule    Refill:  1   I, Penni Homans, MD, personally preformed the services described in this documentation.  All medical record entries made by the scribe were at my direction and in my presence.  I have reviewed the chart and discharge instructions (if applicable) and agree that the record reflects my personal performance and is accurate and complete. 12/15/2022  I,Mohammed Iqbal,acting as a scribe for Penni Homans, MD.,have documented all relevant documentation on the behalf of Penni Homans, MD,as directed by  Penni Homans, MD while in the presence of Penni Homans, MD.  Penni Homans, MD

## 2022-12-15 NOTE — Patient Instructions (Signed)
Clostridioides Difficile Infection Clostridioides difficile infection, also known as C. difficile or C. diff infection, happens when too much C. diff bacteria grows. This can cause severe diarrhea and inflammation of the colon (colitis). It is linked to recent use of antibiotic medicine. This infection can be passed from person to person (is contagious). You also may be exposed to the bacteria from contact with food, water, or surfaces that have the bacteria on them. What are the causes? Certain bacteria live in the colon and help to digest food. This infection develops when the balance of helpful bacteria in the colon changes and the C. diff bacteria grow out of control. This is often caused by taking antibiotics. What increases the risk? You may be more likely to develop this condition if you: Take certain antibiotics that kill many types of bacteria or take antibiotics for a long time. Have an extended stay in a hospital or long-term care facility. Are older than age 65. Have had a C. diff infection before or have been exposed to C. diff bacteria. Have a weakened disease-fighting system (immune system). Take a medicine to reduce stomach acid, such as a proton pump inhibitor, for a long time. Have a serious underlying condition, such as colon cancer or inflammatory bowel disease (IBD). Have had a gastrointestinal (GI) tract procedure. What are the signs or symptoms? Symptoms of this condition include: Diarrhea (three or more times a day) for several days. Fever. Loss of appetite. Nausea. Swelling, pain, cramping, or tenderness in the abdomen. How is this diagnosed? This condition is diagnosed with: Your medical history and a physical exam. Tests, which may include: A test for C. diff in your stool (feces). Blood tests. Imaging tests, such as a CT scan of your abdomen. A procedure in which your colon is examined. This is rare. How is this treated? Treatment for this condition may  include: Stopping the antibiotics that you were taking when the C. diff infection began. Do this only as told by your health care provider. Taking certain antibiotics to stop C. diff growth. Taking stool from a healthy person and placing it into your colon (fecal transplant). This may be done if the infection keeps coming back. Having surgery to remove the infected part of the colon. This is rare. Follow these instructions at home: Medicines Take over-the-counter and prescription medicines only as told by your health care provider. Take antibiotic medicine as told by your health care provider. Do not stop taking the antibiotic even if you start to feel better. Do not treat diarrhea with medicines unless your health care provider tells you to. Eating and drinking  Follow instructions from your health care provider about eating and drinking restrictions. Eat bland foods in small amounts that are easy to digest. These include bananas, applesauce, rice, lean meats, toast, and crackers. Follow instructions on replacing body fluid that has been lost (rehydrate). This may include: Drinking clear fluids, such as water, clear fruit juice that is diluted with water, and low-calorie sports drinks. Sucking on ice chips. Taking an oral rehydration solution (ORS). This drink is sold at pharmacies and retail stores. Avoid milk, caffeine, and alcohol. Drink enough fluid to keep your urine pale yellow. General instructions Wash your hands often with soap and water for at least 20 seconds. Bathe or shower using soap and water daily. Return to your normal activities as told by your health care provider. Ask your health care provider what activities are safe for you. Be sure your home is   clean before you leave the hospital or clinic to go home. Then continue daily cleaning for at least a week. Keep all follow-up visits. This is important. How is this prevented? Hand hygiene  Wash your hands with soap and  water for at least 20 seconds before preparing food and after using the bathroom. Make sure the people you live with also wash their hands often with soap and water for at least 20 seconds. If you are being treated at a hospital or clinic, make sure that all health care providers and visitors wash their hands with soap and water before touching you. Contact precautions Tell your health care team right away if you develop diarrhea while in a hospital or long-term care facility. When visiting someone in a hospital or a long-term care facility, follow guidelines for wearing a gown, gloves, or other protective equipment. If possible, avoid contact with people who have diarrhea. Use a separate bathroom if you are sick and live with other people, if possible. Clean environment Clean surfaces that are touched often every day. C. diff bacteria are killed only by cleaning products that contain 10% chlorine bleach solution. Be sure to: Read the product's label to make sure the product will kill the bacteria on the surface you are cleaning. Clean frequently touched surfaces, such as toilet seats and flush handles, bathtubs, sinks, doorknobs, and work surfaces. If you are in the hospital, make sure that staff members clean the surfaces in your room daily. Let a staff person know right away if body fluids have splashed or spilled. Washing clothes and linens Use a powder laundry detergent containing chlorine bleach instead of liquid detergent. Powder detergents contain chlorine bleach in low levels to help kill bacteria. Run your empty washing machine on the hot setting once a month with enough detergent for a full load. This will kill any remaining C. diff bacteria. Contact a health care provider if: Your symptoms do not get better, or they get worse, even with treatment. Your symptoms go away and then come back. You have a fever. You develop new symptoms. Get help right away if: You have more pain or  tenderness in your abdomen. You have stool that is mostly bloody, or looks black and tarry. You cannot eat or drink without vomiting. You have signs of dehydration, such as: Dark urine, very little urine, or no urine. Cracked lips or dry mouth. Not making tears when you cry. Sunken eyes. Sleepiness. Weakness or dizziness. Summary Clostridioides difficile infection, or C. diff infection, can cause severe diarrhea and inflammation of the colon (colitis). It is linked to recent antibiotic use. C. diff infection can spread from person to person (is contagious). You also may be exposed to the bacteria from contact with food, water, or surfaces that have the bacteria on them. This infection may be treated by stopping the antibiotics you were using when the infection began. Fecal transplant or surgery may be needed for repeat or severe infections. Washing hands with soap and water for at least 20 seconds after you use the bathroom and before you eat, and cleaning surfaces with a 10% bleach solution, can help prevent or limit spread of this infection. This information is not intended to replace advice given to you by your health care provider. Make sure you discuss any questions you have with your health care provider. Document Revised: 02/15/2020 Document Reviewed: 02/15/2020 Elsevier Patient Education  2023 [employer]  

## 2022-12-16 DIAGNOSIS — R000 Tachycardia, unspecified: Secondary | ICD-10-CM | POA: Diagnosis not present

## 2022-12-16 DIAGNOSIS — R739 Hyperglycemia, unspecified: Secondary | ICD-10-CM | POA: Diagnosis not present

## 2022-12-16 DIAGNOSIS — D72819 Decreased white blood cell count, unspecified: Secondary | ICD-10-CM | POA: Diagnosis not present

## 2022-12-16 DIAGNOSIS — E860 Dehydration: Secondary | ICD-10-CM | POA: Diagnosis not present

## 2022-12-18 ENCOUNTER — Other Ambulatory Visit: Payer: Self-pay

## 2022-12-18 ENCOUNTER — Encounter (HOSPITAL_BASED_OUTPATIENT_CLINIC_OR_DEPARTMENT_OTHER): Payer: Self-pay | Admitting: Emergency Medicine

## 2022-12-18 ENCOUNTER — Emergency Department (HOSPITAL_BASED_OUTPATIENT_CLINIC_OR_DEPARTMENT_OTHER)
Admission: EM | Admit: 2022-12-18 | Discharge: 2022-12-18 | Disposition: A | Discharge status: Home / Self Care | Payer: 59 | Attending: Emergency Medicine | Admitting: Emergency Medicine

## 2022-12-18 DIAGNOSIS — R197 Diarrhea, unspecified: Secondary | ICD-10-CM | POA: Diagnosis not present

## 2022-12-18 DIAGNOSIS — I509 Heart failure, unspecified: Secondary | ICD-10-CM | POA: Diagnosis not present

## 2022-12-18 DIAGNOSIS — K580 Irritable bowel syndrome with diarrhea: Secondary | ICD-10-CM | POA: Diagnosis not present

## 2022-12-18 DIAGNOSIS — A09 Infectious gastroenteritis and colitis, unspecified: Secondary | ICD-10-CM

## 2022-12-18 DIAGNOSIS — A0472 Enterocolitis due to Clostridium difficile, not specified as recurrent: Secondary | ICD-10-CM

## 2022-12-18 DIAGNOSIS — R140 Abdominal distension (gaseous): Secondary | ICD-10-CM | POA: Diagnosis not present

## 2022-12-18 DIAGNOSIS — Z7982 Long term (current) use of aspirin: Secondary | ICD-10-CM | POA: Diagnosis not present

## 2022-12-18 LAB — DIFFERENTIAL
Abs Immature Granulocytes: 0.01 10*3/uL (ref 0.00–0.07)
Basophils Absolute: 0 10*3/uL (ref 0.0–0.1)
Basophils Relative: 1 %
Eosinophils Absolute: 0.1 10*3/uL (ref 0.0–0.5)
Eosinophils Relative: 2 %
Immature Granulocytes: 0 %
Lymphocytes Relative: 42 %
Lymphs Abs: 1.2 10*3/uL (ref 0.7–4.0)
Monocytes Absolute: 0.3 10*3/uL (ref 0.1–1.0)
Monocytes Relative: 10 %
Neutro Abs: 1.2 10*3/uL — ABNORMAL LOW (ref 1.7–7.7)
Neutrophils Relative %: 45 %

## 2022-12-18 LAB — COMPREHENSIVE METABOLIC PANEL
ALT: 15 U/L (ref 0–44)
AST: 23 U/L (ref 15–41)
Albumin: 3 g/dL — ABNORMAL LOW (ref 3.5–5.0)
Alkaline Phosphatase: 47 U/L (ref 38–126)
Anion gap: 6 (ref 5–15)
BUN: 19 mg/dL (ref 8–23)
CO2: 24 mmol/L (ref 22–32)
Calcium: 8.5 mg/dL — ABNORMAL LOW (ref 8.9–10.3)
Chloride: 105 mmol/L (ref 98–111)
Creatinine, Ser: 0.7 mg/dL (ref 0.44–1.00)
GFR, Estimated: >60 mL/min (ref >60)
Glucose, Bld: 97 mg/dL (ref 70–99)
Potassium: 4 mmol/L (ref 3.5–5.1)
Sodium: 135 mmol/L (ref 135–145)
Total Bilirubin: 0.5 mg/dL (ref 0.3–1.2)
Total Protein: 8.4 g/dL — ABNORMAL HIGH (ref 6.5–8.1)

## 2022-12-18 LAB — CBC
HCT: 36.8 % (ref 36.0–46.0)
Hemoglobin: 11.6 g/dL — ABNORMAL LOW (ref 12.0–15.0)
MCH: 28.4 pg (ref 26.0–34.0)
MCHC: 31.5 g/dL (ref 30.0–36.0)
MCV: 90 fL (ref 80.0–100.0)
Platelets: 109 10*3/uL — ABNORMAL LOW (ref 150–400)
RBC: 4.09 MIL/uL (ref 3.87–5.11)
RDW: 14.1 % (ref 11.5–15.5)
WBC: 2.8 10*3/uL — ABNORMAL LOW (ref 4.0–10.5)
nRBC: 0 % (ref 0.0–0.2)

## 2022-12-18 LAB — MAGNESIUM: Magnesium: 1.8 mg/dL (ref 1.7–2.4)

## 2022-12-18 MED ORDER — FIDAXOMICIN 200 MG PO TABS: 200.0000 mg | ORAL_TABLET | Freq: Two times a day (BID) | ORAL | 0 refills | Status: AC

## 2022-12-18 MED ORDER — LACTATED RINGERS IV BOLUS
500.0000 mL | Freq: Once | INTRAVENOUS | Status: AC
  Administered 2022-12-18: 500 mL via INTRAVENOUS

## 2022-12-18 MED ORDER — FIDAXOMICIN 200 MG PO TABS: 200.0000 mg | ORAL_TABLET | ORAL | Status: DC

## 2022-12-18 NOTE — ED Notes (Signed)
D/c paperwork reviewed with pt, including prescriptions and follow up care.  No questions or concerns voiced at time of d/c. Tami [name] Pt verbalized understanding, Wheeled by ED staff to ED exit, NAD.

## 2022-12-18 NOTE — ED Triage Notes (Signed)
Diarrhea x 1 week , was diagnosed with C diff on 12/08/2022. On antibiotics .  Feeling tired and weak , here for IV fluids she said suggested by her PCP

## 2022-12-18 NOTE — ED Notes (Signed)
ED Provider at bedside. 

## 2022-12-18 NOTE — ED Notes (Signed)
Pt ambulatory to bathroom, standby assistance.  

## 2022-12-18 NOTE — ED Provider Notes (Signed)
[facility] Provider Note   CSN: [id number] Arrival date & time: 12/18/22  1256     History {Add pertinent medical, surgical, social history, OB history to HPI:1} Chief Complaint  Patient presents with   Diarrhea    Tami [name] is a 67 y.o. female.  67 year old female with a history of CHF, multiple myeloma, and C. difficile currently on vancomycin who presents emergency department with diarrhea.  Patient reports that for the past 3 weeks she has had profuse diarrhea with at least 4 episodes per day.  Nonbloody nonmelanotic.  Says that she was started on oral vancomycin on 12/09/2022 without significant improvement of her symptoms.  Denies any fever.  No significant abdominal pain.  No nausea or vomiting.  Says that because of the diarrhea she starts to feel dizzy and so has to occasionally come in for IV fluids.  Was feeling dizzy today and decided to come into the emergency department for fluids.  Says that in addition to the vancomycin she is taking magnesium lysinate flycinate chelate, colestipol hcl, Imodium, and Pepto-Bismol.  Denies any significant swelling or shortness of breath recently.       Home Medications Prior to Admission medications   Medication Sig Start Date End Date Taking? Authorizing Provider  acetaminophen (TYLENOL) 500 MG tablet Take 500 mg by mouth in the morning.    [provider]  acyclovir (ZOVIRAX) 400 MG tablet TAKE ONE (1) TABLET BY MOUTH TWO (2) TIMES DAILY 08/29/22   [name], MD  albuterol (PROVENTIL) (2.5 MG/3ML) 0.083% nebulizer solution Take 3 mLs (2.5 mg total) by nebulization every 6 (six) hours as needed for wheezing or shortness of breath. 12/15/22   Mosie Lukes, MD  albuterol (VENTOLIN HFA) 108 (90 Base) MCG/ACT inhaler Inhale 2 puffs into the lungs every 6 (six) hours as needed for wheezing or shortness of breath. 12/07/22   Mosie Lukes, MD  aspirin EC 81 MG tablet Take 1  tablet (81 mg total) by mouth daily. Patient taking differently: Take 81 mg by mouth every evening. 02/15/18   Mosie Lukes, MD  cetirizine (ZYRTEC) 10 MG tablet Take 10 mg by mouth at bedtime.    [provider]  Cholecalciferol (VITAMIN D3) 50 MCG (2000 UT) TABS Take 2,000 Units by mouth in the morning.    [provider]  colestipol (COLESTID) 1 g tablet Take 3-4 tablets (3-4 g total) by mouth every 12 (twelve) hours. Patient taking differently: Take 2 g by mouth every 12 (twelve) hours. 10/13/22   Mosie Lukes, MD  dicyclomine (BENTYL) 10 MG capsule Take 1 capsule (10 mg total) by mouth 4 (four) times daily -  before meals and at bedtime. 10/13/22 01/11/23  Mosie Lukes, MD  diphenoxylate-atropine (LOMOTIL) 2.5-0.025 MG tablet Take 1 tablet by mouth 4 (four) times daily as needed for diarrhea or loose stools. 06/26/22   [name], MD  fluticasone (FLONASE) 50 MCG/ACT nasal spray Place 2 sprays into both nostrils daily. 01/07/20   Joy, Shawn C, PA-C  furosemide (LASIX) 20 MG tablet Take 20 mg by mouth daily as needed (fluid retention (feet swelling)). 07/03/20   [provider]  hyoscyamine (LEVSIN SL) 0.125 MG SL tablet Place 1 tablet (0.125 mg total) under the tongue every 4 (four) hours as needed. 05/21/22   Mosie Lukes, MD  lidocaine (LIDODERM) 5 % Place 1 patch onto the skin daily. Remove & Discard patch within  12 hours or as directed by MD 12/08/22   Carlisle Cater, PA-C  loperamide (IMODIUM) 2 MG capsule Take 1 capsule (2 mg total) by mouth 4 (four) times daily as needed for diarrhea or loose stools. 07/21/22   Elgie Congo, MD  losartan (COZAAR) 50 MG tablet Take 50 mg by mouth in the morning and at bedtime. 04/14/21   [provider]  Magnesium Glycinate 100 MG CAPS Take 400 mg by mouth at bedtime. 09/21/22   [provider]  methocarbamol (ROBAXIN) 500 MG tablet Take 2 tablets (1,000 mg total) by mouth at bedtime as needed for  muscle spasms. 12/08/22   Carlisle Cater, PA-C  metoprolol succinate (TOPROL-XL) 50 MG 24 hr tablet Take 1 tablet by mouth daily. 09/21/22   [provider]  oxyCODONE-acetaminophen (PERCOCET/ROXICET) 5-325 MG tablet Take 1 tablet by mouth every 6 (six) hours as needed for severe pain. 08/25/22   Mosie Lukes, MD  potassium chloride SA (KLOR-CON M) 20 MEQ tablet Take 1 tablet (20 mEq total) by mouth daily. 09/01/22   Jackquline Denmark, MD  promethazine (PHENERGAN) 25 MG tablet Take 1 tablet (25 mg total) by mouth every 8 (eight) hours as needed for up to 15 doses for nausea or vomiting. 09/17/22   Deno Etienne, DO  spironolactone (ALDACTONE) 25 MG tablet Take 12.5 mg by mouth every evening.    [provider]  sucralfate (CARAFATE) 1 g tablet Take 1 tablet (1 g total) by mouth 4 (four) times daily -  with meals and at bedtime. 05/21/22   Mosie Lukes, MD  vancomycin (VANCOCIN) 125 MG capsule Take 1 capsule (125 mg total) by mouth 4 (four) times daily for 14 days. 12/08/22 12/22/22  Mosie Lukes, MD  vancomycin (VANCOCIN) 125 MG capsule Take 1 capsule (125 mg total) by mouth 3 (three) times daily for 7 days, THEN 1 capsule (125 mg total) 2 (two) times daily for 7 days, THEN 1 capsule (125 mg total) daily for 14 days. 12/15/22 01/12/23  Mosie Lukes, MD  vancomycin (VANCOCIN) 125 MG capsule Take 1 capsule (125 mg total) by mouth daily. 12/15/22   Mosie Lukes, MD      Allergies    Benazepril, Ondansetron hcl, Codeine, and Morphine    Review of Systems   Review of Systems  Physical Exam Updated Vital Signs BP 122/85   Pulse 92   Temp 98.2 F (36.8 C) (Oral)   Resp 16   SpO2 97%  Physical Exam Vitals and nursing note reviewed.  Constitutional:      General: She is not in acute distress.    Appearance: She is well-developed.  HENT:     Head: Normocephalic and atraumatic.     Right Ear: External ear normal.     Left Ear: External ear normal.     Nose: Nose normal.  Eyes:      Extraocular Movements: Extraocular movements intact.     Conjunctiva/sclera: Conjunctivae normal.     Pupils: Pupils are equal, round, and reactive to light.  Cardiovascular:     Rate and Rhythm: Normal rate and regular rhythm.  Pulmonary:     Effort: Pulmonary effort is normal. No respiratory distress.  Abdominal:     General: Abdomen is flat. There is no distension.     Palpations: Abdomen is soft. There is no mass.     Tenderness: There is abdominal tenderness (Mild epigastric). There is no guarding.  Musculoskeletal:     Cervical  back: Normal range of motion and neck supple.     Right lower leg: No edema.     Left lower leg: No edema.  Skin:    General: Skin is warm and dry.  Neurological:     Mental Status: She is alert and oriented to person, place, and time. Mental status is at baseline.  Psychiatric:        Mood and Affect: Mood normal.     ED Results / Procedures / Treatments   Labs (all labs ordered are listed, but only abnormal results are displayed) Labs Reviewed  LIPASE, BLOOD  COMPREHENSIVE METABOLIC PANEL  CBC  URINALYSIS, ROUTINE W REFLEX MICROSCOPIC    EKG None  Radiology No results found.  Procedures Procedures  {Document cardiac monitor, telemetry assessment procedure when appropriate:1}  Medications Ordered in ED Medications  lactated ringers bolus 500 mL (500 mLs Intravenous New Bag/Given 12/18/22 1515)    ED Course/ Medical Decision Making/ A&P   {   Click here for ABCD2, HEART and other calculatorsREFRESH Note before signing :1}                          Medical Decision Making Amount and/or Complexity of Data Reviewed Labs: ordered.   ***  {Document critical care time when appropriate:1} {Document review of labs and clinical decision tools ie heart score, Chads2Vasc2 etc:1}  {Document your independent review of radiology images, and any outside records:1} {Document your discussion with family members, caretakers, and with  consultants:1} {Document social determinants of health affecting pt's care:1} {Document your decision making why or why not admission, treatments were needed:1} Final Clinical Impression(s) / ED Diagnoses Final diagnoses:  None    Rx / DC Orders ED Discharge Orders     None

## 2022-12-18 NOTE — ED Notes (Signed)
CMP recollected and submitted to lab, per lab request.

## 2022-12-18 NOTE — Discharge Instructions (Addendum)
You were seen for your diarrhea in the emergency department.   At home, please start to take the fidaxomicin that you have been prescribed.    Follow-up with your primary doctor in 2-3 days regarding your visit.  You may also follow-up with the infectious disease clinic if your primary doctor feels that this is necessary.  Return immediately to the emergency department if you experience any of the following: Severe pain, fainting, fevers, or any other concerning symptoms.    Thank you for visiting our Emergency Department. It was a pleasure taking care of you today.

## 2022-12-20 DIAGNOSIS — J45909 Unspecified asthma, uncomplicated: Secondary | ICD-10-CM | POA: Insufficient documentation

## 2022-12-20 NOTE — Assessment & Plan Note (Signed)
She is noting increasing SOB and wheezing. Using Albuterol roughly 3 x weekly will refer to pulmonology for further evaluation

## 2022-12-20 NOTE — Assessment & Plan Note (Signed)
No recent exacerbation, no changes

## 2022-12-20 NOTE — Assessment & Plan Note (Signed)
Was treated with bactrim by an urgent care secondary to UTI prior to her CDiff infection returnin

## 2022-12-20 NOTE — Assessment & Plan Note (Signed)
Improving some on Vancomycin but this is her 3rd course so is referred infectious disease.

## 2022-12-20 NOTE — Assessment & Plan Note (Signed)
hgba1c acceptable, minimize simple carbs. Increase exercise as tolerated.  

## 2022-12-20 NOTE — Assessment & Plan Note (Signed)
Well controlled, no changes to meds. Encouraged heart healthy diet such as the DASH diet and exercise as tolerated.  

## 2022-12-20 NOTE — Assessment & Plan Note (Signed)
Encoaurged increased hydration with pediatlyte, gatorade, water etc. Notify us if unable to tolerate increased hydration

## 2022-12-21 ENCOUNTER — Telehealth: Payer: Self-pay | Admitting: Family Medicine

## 2022-12-21 NOTE — Telephone Encounter (Signed)
Patient stepped in office just to let us know she was in ED again this past Friday, 2/09. She states they gave her more fluids & changed her medication that Dr Charlett Blake add her on for Cdiff.  They have put her on   fidaxomicin (DIFICID) 200 MG TABS tablet [id number]   1 Tab, 2x daily   She would like to know if Dr Charlett Blake would wanna see her again and talk about this medication change

## 2022-12-22 NOTE — Telephone Encounter (Signed)
Pt has appt with infectious disease 12/24/22

## 2022-12-24 ENCOUNTER — Emergency Department (HOSPITAL_BASED_OUTPATIENT_CLINIC_OR_DEPARTMENT_OTHER)
Admission: EM | Admit: 2022-12-24 | Discharge: 2022-12-24 | Disposition: A | Discharge status: Home / Self Care | Payer: 59 | Attending: Emergency Medicine | Admitting: Emergency Medicine

## 2022-12-24 ENCOUNTER — Other Ambulatory Visit: Payer: Self-pay

## 2022-12-24 ENCOUNTER — Encounter: Payer: Self-pay | Admitting: Internal Medicine

## 2022-12-24 ENCOUNTER — Other Ambulatory Visit (HOSPITAL_COMMUNITY): Payer: Self-pay

## 2022-12-24 ENCOUNTER — Ambulatory Visit (INDEPENDENT_AMBULATORY_CARE_PROVIDER_SITE_OTHER): Payer: 59 | Admitting: Internal Medicine

## 2022-12-24 ENCOUNTER — Encounter (HOSPITAL_BASED_OUTPATIENT_CLINIC_OR_DEPARTMENT_OTHER): Payer: Self-pay

## 2022-12-24 VITALS — BP 134/83 | HR 103 | Resp 16 | Ht 66.0 in | Wt 194.0 lb

## 2022-12-24 DIAGNOSIS — Z950 Presence of cardiac pacemaker: Secondary | ICD-10-CM | POA: Insufficient documentation

## 2022-12-24 DIAGNOSIS — I509 Heart failure, unspecified: Secondary | ICD-10-CM | POA: Diagnosis not present

## 2022-12-24 DIAGNOSIS — R197 Diarrhea, unspecified: Secondary | ICD-10-CM | POA: Diagnosis not present

## 2022-12-24 DIAGNOSIS — I110 Hypertensive heart disease with heart failure: Secondary | ICD-10-CM | POA: Insufficient documentation

## 2022-12-24 DIAGNOSIS — Z79899 Other long term (current) drug therapy: Secondary | ICD-10-CM | POA: Diagnosis not present

## 2022-12-24 DIAGNOSIS — E860 Dehydration: Secondary | ICD-10-CM | POA: Diagnosis not present

## 2022-12-24 DIAGNOSIS — A0472 Enterocolitis due to Clostridium difficile, not specified as recurrent: Secondary | ICD-10-CM | POA: Diagnosis not present

## 2022-12-24 DIAGNOSIS — R1013 Epigastric pain: Secondary | ICD-10-CM | POA: Insufficient documentation

## 2022-12-24 DIAGNOSIS — Z7984 Long term (current) use of oral hypoglycemic drugs: Secondary | ICD-10-CM | POA: Diagnosis not present

## 2022-12-24 LAB — URINALYSIS, ROUTINE W REFLEX MICROSCOPIC
Bilirubin Urine: NEGATIVE
Glucose, UA: NEGATIVE mg/dL
Hgb urine dipstick: NEGATIVE
Ketones, ur: NEGATIVE mg/dL
Nitrite: NEGATIVE
Protein, ur: NEGATIVE mg/dL
Specific Gravity, Urine: >=1.03 (ref 1.005–1.030)
pH: 5 (ref 5.0–8.0)

## 2022-12-24 LAB — CBC
HCT: 36.5 % (ref 36.0–46.0)
Hemoglobin: 11.8 g/dL — ABNORMAL LOW (ref 12.0–15.0)
MCH: 28.8 pg (ref 26.0–34.0)
MCHC: 32.3 g/dL (ref 30.0–36.0)
MCV: 89 fL (ref 80.0–100.0)
Platelets: 103 10*3/uL — ABNORMAL LOW (ref 150–400)
RBC: 4.1 MIL/uL (ref 3.87–5.11)
RDW: 13.8 % (ref 11.5–15.5)
WBC: 2.5 10*3/uL — ABNORMAL LOW (ref 4.0–10.5)
nRBC: 0 % (ref 0.0–0.2)

## 2022-12-24 LAB — URINALYSIS, MICROSCOPIC (REFLEX)

## 2022-12-24 LAB — COMPREHENSIVE METABOLIC PANEL
ALT: 16 U/L (ref 0–44)
AST: 24 U/L (ref 15–41)
Albumin: 3.4 g/dL — ABNORMAL LOW (ref 3.5–5.0)
Alkaline Phosphatase: 52 U/L (ref 38–126)
Anion gap: 5 (ref 5–15)
BUN: 19 mg/dL (ref 8–23)
CO2: 25 mmol/L (ref 22–32)
Calcium: 8.8 mg/dL — ABNORMAL LOW (ref 8.9–10.3)
Chloride: 104 mmol/L (ref 98–111)
Creatinine, Ser: 0.94 mg/dL (ref 0.44–1.00)
GFR, Estimated: >60 mL/min (ref >60)
Glucose, Bld: 104 mg/dL — ABNORMAL HIGH (ref 70–99)
Potassium: 4.3 mmol/L (ref 3.5–5.1)
Sodium: 134 mmol/L — ABNORMAL LOW (ref 135–145)
Total Bilirubin: 0.4 mg/dL (ref 0.3–1.2)
Total Protein: 9.5 g/dL — ABNORMAL HIGH (ref 6.5–8.1)

## 2022-12-24 LAB — C DIFFICILE QUICK SCREEN W PCR REFLEX
C Diff antigen: NEGATIVE
C Diff interpretation: NOT DETECTED
C Diff toxin: NEGATIVE

## 2022-12-24 LAB — LIPASE, BLOOD: Lipase: 38 U/L (ref 11–51)

## 2022-12-24 MED ORDER — SODIUM CHLORIDE 0.9 % IV BOLUS
1000.0000 mL | Freq: Once | INTRAVENOUS | Status: AC
  Administered 2022-12-24: 1000 mL via INTRAVENOUS

## 2022-12-24 NOTE — ED Notes (Signed)
Obtained stool sample in triage, stool was not liquid diarrhea, but was soft pile of stool.

## 2022-12-24 NOTE — Progress Notes (Signed)
[name], female   DOB: 12/28/1955, 67 y.o.   MRN: [id number]  HPI Tami [name] is 67yo F with MM, hx of IBS-D, hx of cdiff in nov 2022, had increase in bowel habits that was concerning for cdiff recurrence in late Fall, initial tests were negative.  She also did undergo colonoscopy by dr Lyndel Safe which was WNL, no identifiable cause of diarrhea. She was started on colestid and dicyclomine. She did have ongoing diarrhea for which pcp tested for cdiff which was + per report. She was started on oral vancomycin but still no improvement in decrease BM. She went to the ED on 2/09 where she was given IVF plus changed to dificid 218m po bid -- started on 4 days ago. Yesterday 5 times had loose stools but not watery stools, Tuesday 3 times only.  Feeling weak, trying to keep up with pedialyte, gatorade.   ROS: nausea - takes promethazine Occasional cramping with BMs. Has loose not watery diarrhea    Outpatient Encounter Medications as of 12/24/2022  Medication Sig   acetaminophen (TYLENOL) 500 MG tablet Take 500 mg by mouth in the morning.   acyclovir (ZOVIRAX) 400 MG tablet TAKE ONE (1) TABLET BY MOUTH TWO (2) TIMES DAILY   albuterol (PROVENTIL) (2.5 MG/3ML) 0.083% nebulizer solution Take 3 mLs (2.5 mg total) by nebulization every 6 (six) hours as needed for wheezing or shortness of breath.   albuterol (VENTOLIN HFA) 108 (90 Base) MCG/ACT inhaler Inhale 2 puffs into the lungs every 6 (six) hours as needed for wheezing or shortness of breath.   aspirin EC 81 MG tablet Take 1 tablet (81 mg total) by mouth daily. (Patient taking differently: Take 81 mg by mouth every evening.)   cetirizine (ZYRTEC) 10 MG tablet Take 10 mg by mouth at bedtime.   Cholecalciferol (VITAMIN D3) 50 MCG (2000 UT) TABS Take 2,000 Units by mouth in the morning.   colestipol (COLESTID) 1 g tablet Take 3-4 tablets (3-4 g total) by mouth every 12 (twelve) hours. (Patient taking differently: Take 2 g by mouth every  12 (twelve) hours.)   dicyclomine (BENTYL) 10 MG capsule Take 1 capsule (10 mg total) by mouth 4 (four) times daily -  before meals and at bedtime.   diphenoxylate-atropine (LOMOTIL) 2.5-0.025 MG tablet Take 1 tablet by mouth 4 (four) times daily as needed for diarrhea or loose stools.   fluticasone (FLONASE) 50 MCG/ACT nasal spray Place 2 sprays into both nostrils daily.   furosemide (LASIX) 20 MG tablet Take 20 mg by mouth daily as needed (fluid retention (feet swelling)).   hyoscyamine (LEVSIN SL) 0.125 MG SL tablet Place 1 tablet (0.125 mg total) under the tongue every 4 (four) hours as needed.   lidocaine (LIDODERM) 5 % Place 1 patch onto the skin daily. Remove & Discard patch within 12 hours or as directed by MD   loperamide (IMODIUM) 2 MG capsule Take 1 capsule (2 mg total) by mouth 4 (four) times daily as needed for diarrhea or loose stools.   losartan (COZAAR) 50 MG tablet Take 50 mg by mouth in the morning and at bedtime.   Magnesium Glycinate 100 MG CAPS Take 400 mg by mouth at bedtime.   methocarbamol (ROBAXIN) 500 MG tablet Take 2 tablets (1,000 mg total) by mouth at bedtime as needed for muscle spasms.   metoprolol succinate (TOPROL-XL) 50 MG 24 hr tablet Take 1 tablet by mouth daily.   oxyCODONE-acetaminophen (PERCOCET/ROXICET) 5-325 MG tablet Take 1 tablet  by mouth every 6 (six) hours as needed for severe pain.   potassium chloride SA (KLOR-CON M) 20 MEQ tablet Take 1 tablet (20 mEq total) by mouth daily.   promethazine (PHENERGAN) 25 MG tablet Take 1 tablet (25 mg total) by mouth every 8 (eight) hours as needed for up to 15 doses for nausea or vomiting.   spironolactone (ALDACTONE) 25 MG tablet Take 12.5 mg by mouth every evening.   sucralfate (CARAFATE) 1 g tablet Take 1 tablet (1 g total) by mouth 4 (four) times daily -  with meals and at bedtime.   fidaxomicin (DIFICID) 200 MG TABS tablet Take 1 tablet (200 mg total) by mouth 2 (two) times daily for 10 days.    Facility-Administered Encounter Medications as of 12/24/2022  Medication   aspirin chewable tablet 162 mg   nitroGLYCERIN (NITROSTAT) SL tablet 0.4 mg     Patient Active Problem List   Diagnosis Date Noted   Asthma 12/20/2022   Irritable bowel syndrome with diarrhea    Nausea and vomiting    Hypocalcemia 08/25/2022   Iron deficiency anemia due to chronic blood loss 05/28/2022   Chronic diarrhea 05/27/2022   Soft tissue lesion 04/28/2022   Obesity (BMI 30-39.9) 04/28/2022   SBO (small bowel obstruction) ([place]) 04/27/2022   Dehydration 04/03/2022   Acute vaginitis 03/18/2022   Cervical cancer screening 03/18/2022   Dizziness 12/11/2021   Visual changes 11/26/2021   Ear fullness, bilateral 08/12/2021   Bilateral knee pain 08/08/2021   History of 2019 novel coronavirus disease (COVID-19) 12/06/2020   Low vitamin D level 07/31/2020   Acute combined systolic and diastolic heart failure ([place]) 05/09/2020   Hypomagnesemia 03/04/2020   Thrombocytopenia ([place]) 03/04/2020   Dysphagia 03/04/2020   Constipation 03/04/2020   Sensation of pressure in bladder area 01/17/2020   Hyperglycemia 01/17/2020   Peripheral neuropathy 11/15/2019   Chronic migraine without aura, with intractable migraine, so stated, with status migrainosus 10/17/2019   ETD (Eustachian tube dysfunction), bilateral 09/12/2019   Urinary frequency 07/17/2019   Headache 07/17/2019   Palpitation 05/15/2019   Clostridium difficile diarrhea 09/04/2018   Thyroid nodule 08/26/2018   Multiple myeloma ([place]) 07/20/2018   Multiple myeloma not having achieved remission ([place]) 07/20/2018   Hematuria 07/11/2018   Seasonal allergies 06/23/2018   Right shoulder pain 06/07/2018   Neck pain 06/07/2018   Paresthesias 06/07/2018   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   Pain in thoracic spine 03/18/2018   Thoracic back pain 02/15/2018   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   Hypokalemia 08/20/2017   Anemia 08/20/2017   Elevated  sed rate 08/15/2017   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   Epigastric abdominal pain 03/22/2017   Myalgia 12/31/2016   Abnormal SPEP 07/26/2016   Low back pain 07/14/2016   Insomnia 12/29/2015   Tension headache 10/21/2015   Muscle spasms of neck 10/18/2015   Dysuria 10/13/2015   AP (abdominal pain) 07/07/2015   Cardiomyopathy ([place]) 04/19/2015   Chest pain 04/19/2015   Obstructive sleep apnea 03/31/2015   Congestive heart failure ([place]) 02/24/2015   Arthritis    Hyperlipidemia    Hypertension    Depression    Stroke [facility])    GERD (gastroesophageal reflux disease)    Bowel obstruction ([place]) 11/09/2014     Health Maintenance Due  Topic Date Due   Pneumonia Vaccine 2+ Years old (4 of 4 - PPSV23 or PCV20) 06/02/2022   COVID-19 Vaccine (6 - 2023-24 season) 11/03/2022   Zoster Vaccines-  Shingrix (2 of 2) 11/10/2022   Medicare Annual Wellness (AWV)  12/16/2022     Review of Systems 12 point ros otherwise negative other than what is mentioned above Physical Exam   BP 134/83   Pulse (!) 103   Resp 16   Ht 5' 6"$  (1.676 m)   Wt 194 lb (88 kg)   SpO2 98%   BMI 31.31 kg/m   Physical Exam  Constitutional:  oriented to person, place, and time. appears well-developed and well-nourished. No distress.  HENT: Onset/AT, PERRLA, no scleral icterus Mouth/Throat: Oropharynx is clear and moist. No oropharyngeal exudate.  Cardiovascular: Normal rate, regular rhythm and normal heart sounds. Exam reveals no gallop and no friction rub.  No murmur heard.  Pulmonary/Chest: Effort normal and breath sounds normal. No respiratory distress.  has no wheezes.  Neck = supple, no nuchal rigidity Abdominal: Soft. Bowel sounds are normal.  exhibits no distension. There is no tenderness.  Lymphadenopathy: no cervical adenopathy. No axillary adenopathy Neurological: alert and oriented to person, place, and time.  Skin: Skin is warm and dry. No rash noted. No erythema.   Psychiatric: a normal mood and affect.  behavior is normal.   Lab Results  Component Value Date   LABRPR NON REACTIVE 08/23/2020    CBC Lab Results  Component Value Date   WBC 2.8 (L) 12/18/2022   RBC 4.09 12/18/2022   HGB 11.6 (L) 12/18/2022   HCT 36.8 12/18/2022   PLT 109 (L) 12/18/2022   MCV 90.0 12/18/2022   MCH 28.4 12/18/2022   MCHC 31.5 12/18/2022   RDW 14.1 12/18/2022   LYMPHSABS 1.2 12/18/2022   MONOABS 0.3 12/18/2022   EOSABS 0.1 12/18/2022    BMET Lab Results  Component Value Date   NA 135 12/18/2022   K 4.0 12/18/2022   CL 105 12/18/2022   CO2 24 12/18/2022   GLUCOSE 97 12/18/2022   BUN 19 12/18/2022   CREATININE 0.70 12/18/2022   CALCIUM 8.5 (L) 12/18/2022   GFRNONAA >60 12/18/2022   GFRAA >60 08/10/2020      Assessment and Plan  Diarrhea, cdifficile infection = We will do testing for cdiff to see if active infection but suspect that she may have colonization, versus partial treatment of cdiffi.  Has hx of IBS-D which complicates the history of differentiating cdiff.  Finish out 10 daycourse of dificid. Call her back at end of course to consider if need to do a taper 1 pill Q 3 days. Testing for cdiff to see if she would be a candidate for zinplava if + test.  Appears to be tolerating sufficient oral intake. Still may need IVF. If symptomatic, would recommend either follow up with dr Marin Olp or [facility]/ED for eval. Unable to give IVF in clinic.

## 2022-12-24 NOTE — ED Provider Notes (Signed)
Emergency Department Provider Note   I have reviewed the triage vital signs and the nursing notes.   HISTORY  Chief Complaint Diarrhea   HPI Tami [name] is a 67 y.o. female past history of CHF, hypertension, hyperlipidemia, irritable bowel disease- diarrhea type, and recent diagnosis of C. difficile currently on Dificid presents to the emergency department for IV fluids.  She continues to have multiple episodes of loose stools per day but states it has been true for the past 2 years.  No fevers.  She notes some mild epigastric abdominal pain.  She was initially diagnosed with C. difficile by her PCP who started her on oral vancomycin.  This was switched to Dificid in the ED on 2/9.  She had a follow-up appointment with Dr. Graylon Good, from infectious disease, today who advised completing her 10-day course and they will retest to see if she needs to continue. No vomiting. She is keeping fluids down.    Past Medical History:  Diagnosis Date   Abnormal SPEP 07/26/2016   Arthritis    knees, hands   Back pain 07/14/2016   Bowel obstruction ([place]) 11/2014   C. 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 Respiratory: Denies shortness of breath. Gastrointestinal: Positive mild abdominal pain.  No nausea, no vomiting.  Positive diarrhea.  No constipation. Genitourinary: Negative for dysuria. Musculoskeletal: Negative for back pain. Skin: Negative for rash. Neurological: Negative for headaches, focal weakness or numbness.  ____________________________________________   PHYSICAL EXAM:  VITAL SIGNS: ED Triage Vitals  Enc Vitals Group     BP 12/24/22 1522 (!) 144/78     Pulse Rate 12/24/22 1522 93     Resp 12/24/22 1522 18     Temp 12/24/22 1522 98.5 F (36.9 C)     Temp Source 12/24/22 1522 Oral     SpO2 12/24/22 1522 97 %     Weight --      Height 12/24/22 1522 5' 6"$  (1.676 m)   Constitutional: Alert and oriented. Well appearing and in no acute distress. Eyes: Conjunctivae are normal.  Head: Atraumatic. Nose: No congestion/rhinnorhea. Mouth/Throat: Mucous membranes are moist.   Neck: No stridor.  Cardiovascular: Normal rate, regular rhythm. Good peripheral circulation. Grossly normal heart sounds.   Respiratory: Normal respiratory effort.  No retractions. Lungs CTAB. Gastrointestinal: Soft and nontender. No distention.  Musculoskeletal: No lower extremity tenderness nor edema. No gross deformities of extremities. Neurologic:  Normal speech and language. No gross focal neurologic deficits are appreciated.  Skin:  Skin is warm, dry and intact. No rash noted.  ____________________________________________   LABS (all  labs ordered are listed, but only abnormal results are displayed)  Labs Reviewed  COMPREHENSIVE METABOLIC PANEL - Abnormal; Notable for the following components:      Result Value   Sodium 134 (*)    Glucose, Bld 104 (*)    Calcium 8.8 (*)    Total Protein 9.5 (*)    Albumin 3.4 (*)    All other components within normal limits  CBC - Abnormal; Notable for the following components:    WBC 2.5 (*)    Hemoglobin 11.8 (*)    Platelets 103 (*)    All other components within normal limits  URINALYSIS, ROUTINE W REFLEX MICROSCOPIC - Abnormal; Notable for the following components:   APPearance HAZY (*)    Leukocytes,Ua SMALL (*)    All other components within normal limits  URINALYSIS, MICROSCOPIC (REFLEX) - Abnormal; Notable for the following components:   Bacteria, UA MANY (*)    Non Squamous Epithelial PRESENT (*)    All other components within normal limits  C DIFFICILE QUICK SCREEN W PCR REFLEX    GASTROINTESTINAL PANEL BY PCR, STOOL (REPLACES STOOL CULTURE)  LIPASE, BLOOD   ____________________________________________   PROCEDURES  Procedure(s) performed:   Procedures  None ____________________________________________   INITIAL IMPRESSION / ASSESSMENT AND PLAN / ED COURSE  Pertinent labs & imaging results that were available during my care of the patient were reviewed by me and considered in my medical decision making (see chart for details).   This patient is Presenting for Evaluation of abdominal pain, which does require a range of treatment options, and is a complaint that involves a high risk of morbidity and mortality.  The Differential Diagnoses includes but is not exclusive to acute cholecystitis, intrathoracic causes for epigastric abdominal pain, gastritis, duodenitis, pancreatitis, small bowel or large bowel obstruction, abdominal aortic aneurysm, hernia, gastritis, etc.   Critical Interventions-    Medications  sodium chloride 0.9 % bolus 1,000 mL (0 mLs Intravenous Stopped 12/24/22 1740)    Reassessment after intervention: Symptoms significantly improved.    I decided to review pertinent External Data, and in summary reviewed ID note from earlier today along with treatment plan.   Clinical Laboratory Tests Ordered, included c. Diff pending (send out lab). No UTI. No leukocytosis. No significant electrolyte disturbance.   Radiologic  Tests: Considered CT but patient is minimally tender on exam and labs are reassuring.   Cardiac Monitor Tracing which shows NSR.    Social Determinants of Health Risk patient is a non-smoker.    Medical Decision Making: Summary:  Patient presents to the ED with diarrhea. Known IBS-d and recent positive for c. Diff. Starting new abx with plan for 10 days course. Plan for IVF and follow up.   Reevaluation with update and discussion with patient. She is feeling much better. Labs are reassuring. C diff sent but results will be delayed as this is a send out for [place]. ID can follow. Patient to continue abx as prescribed.   Considered admission but feeling much better. Stable for discharge.   Patient's presentation is most consistent with acute presentation with potential threat to life or bodily function.   Disposition: discharge  ____________________________________________  FINAL CLINICAL IMPRESSION(S) / ED DIAGNOSES  Final diagnoses:  Dehydration    Note:  This document was prepared using Dragon voice recognition software and may include unintentional dictation errors.  Nanda Quinton, MD, [facility]    Oluwaferanmi Wain, [place] Olds, MD 12/29/22 [phone]

## 2022-12-24 NOTE — Discharge Instructions (Signed)
Closely with your infectious disease doctors.  They can review the stool cultures that we sent off today and advised on further antibiotic treatment.  The plan for now, is to complete your current antibiotic course and then touch base with Dr. Graylon Good.

## 2022-12-24 NOTE — ED Triage Notes (Signed)
Continues to have diarrhea, diagnosed with C.diff on 12/08/22. Seen here 2/9 for same, adjusted antibiotics. States was sent here by doctor for dehydration and IV fluids.

## 2022-12-25 LAB — GASTROINTESTINAL PANEL BY PCR, STOOL (REPLACES STOOL CULTURE)

## 2022-12-27 DIAGNOSIS — R030 Elevated blood-pressure reading, without diagnosis of hypertension: Secondary | ICD-10-CM | POA: Diagnosis not present

## 2022-12-27 DIAGNOSIS — N390 Urinary tract infection, site not specified: Secondary | ICD-10-CM | POA: Diagnosis not present

## 2022-12-27 DIAGNOSIS — A499 Bacterial infection, unspecified: Secondary | ICD-10-CM | POA: Diagnosis not present

## 2022-12-27 DIAGNOSIS — R300 Dysuria: Secondary | ICD-10-CM | POA: Diagnosis not present

## 2022-12-28 ENCOUNTER — Other Ambulatory Visit: Payer: Self-pay

## 2022-12-28 ENCOUNTER — Other Ambulatory Visit: Payer: 59

## 2022-12-29 DIAGNOSIS — E611 Iron deficiency: Secondary | ICD-10-CM | POA: Diagnosis not present

## 2022-12-29 DIAGNOSIS — M545 Low back pain, unspecified: Secondary | ICD-10-CM | POA: Diagnosis not present

## 2022-12-29 DIAGNOSIS — N390 Urinary tract infection, site not specified: Secondary | ICD-10-CM | POA: Diagnosis not present

## 2022-12-29 DIAGNOSIS — Z79899 Other long term (current) drug therapy: Secondary | ICD-10-CM | POA: Diagnosis not present

## 2022-12-29 DIAGNOSIS — Z1231 Encounter for screening mammogram for malignant neoplasm of breast: Secondary | ICD-10-CM | POA: Diagnosis not present

## 2022-12-29 DIAGNOSIS — E559 Vitamin D deficiency, unspecified: Secondary | ICD-10-CM | POA: Diagnosis not present

## 2022-12-29 DIAGNOSIS — N183 Chronic kidney disease, stage 3 unspecified: Secondary | ICD-10-CM | POA: Diagnosis not present

## 2022-12-29 DIAGNOSIS — E785 Hyperlipidemia, unspecified: Secondary | ICD-10-CM | POA: Diagnosis not present

## 2022-12-29 DIAGNOSIS — I10 Essential (primary) hypertension: Secondary | ICD-10-CM | POA: Diagnosis not present

## 2022-12-29 DIAGNOSIS — R109 Unspecified abdominal pain: Secondary | ICD-10-CM | POA: Diagnosis not present

## 2022-12-31 ENCOUNTER — Ambulatory Visit: Payer: Medicare Other | Admitting: Family Medicine

## 2022-12-31 ENCOUNTER — Encounter: Payer: Self-pay | Admitting: Pulmonary Disease

## 2022-12-31 ENCOUNTER — Ambulatory Visit (INDEPENDENT_AMBULATORY_CARE_PROVIDER_SITE_OTHER): Payer: 59 | Admitting: Pulmonary Disease

## 2022-12-31 VITALS — BP 138/84 | HR 92 | Ht 66.0 in | Wt 203.8 lb

## 2022-12-31 DIAGNOSIS — M5137 Other intervertebral disc degeneration, lumbosacral region: Secondary | ICD-10-CM | POA: Diagnosis not present

## 2022-12-31 DIAGNOSIS — M545 Low back pain, unspecified: Secondary | ICD-10-CM | POA: Diagnosis not present

## 2022-12-31 DIAGNOSIS — J4520 Mild intermittent asthma, uncomplicated: Secondary | ICD-10-CM | POA: Diagnosis not present

## 2022-12-31 DIAGNOSIS — M4316 Spondylolisthesis, lumbar region: Secondary | ICD-10-CM | POA: Diagnosis not present

## 2022-12-31 MED ORDER — BUDESONIDE 0.5 MG/2ML IN SUSP: 0.5000 mg | Freq: Two times a day (BID) | RESPIRATORY_TRACT | 11 refills | Status: DC | PRN

## 2022-12-31 NOTE — Patient Instructions (Addendum)
Start budesonide nebulizer treatments twice daily  Use albuterol inhaler 1-2 puffs every 4-6 hours as needed  Follow up in 4 months with pulmonary function tests

## 2022-12-31 NOTE — Progress Notes (Signed)
Synopsis: Referred in February 2024 for asthma by Penni Homans, MD  Subjective:   [name] GENDER: female DOB: [birth year], MRN: [id number]   HPI  Chief Complaint  Patient presents with   Consult    Referred by PCP for asthma. States the SOB has increased over the past few weeks.    Shally Knickrehm is a 67 year old woman, never smoker with history of OSA on CPAP, multiple myeloma, congestive heart failure, and chronic diarrhea who is referred to pulmonary clinic for asthma.   She has been using albuterol inhaler as needed 1-3 times per week. She has significant exertional dyspnea, but feels this is related to her heart failure. She is using CPAP at night for OSA. She does have seasonal allergies more so in the summer that can affect her breathing. She does have acid reflux. She is being treated for chronic diarrhea by GI. She reports intermittent episodes of severe dehydration due to the loose stools/diarrhea in which she needs IV fluids.   She is a never smoker. She lives alone. She had second hand smoke from her mother in childhood. Her mother had CHF.   Past Medical History:  Diagnosis Date   Abnormal SPEP 07/26/2016   Arthritis    knees, hands   Back pain 07/14/2016   Bowel obstruction ([place]) 11/2014   C. 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   Multiple myeloma ([employer]) 07/20/2018   Multiple myeloma not having achieved remission ([place]) 07/20/2018   Myalgia 12/31/2016   Obstructive sleep apnea 03/31/2015    Presence of permanent cardiac pacemaker    SOB (shortness of breath) 04/09/2016   Stroke ([place])    TIAs   UTI (urinary tract infection)      Family History  Problem Relation Age of Onset   Diabetes Mother    Hypertension Mother    Heart disease Mother        s/p 1 stent   Hyperlipidemia Mother    Arthritis Mother    Cancer Father        COLON   Colon cancer Father 9   Irritable bowel syndrome Sister    Hyperlipidemia Daughter    Hypertension Daughter    Hypertension Maternal Grandmother    Arthritis Maternal Grandmother    Heart disease Maternal Grandfather        MI   Hypertension Maternal Grandfather    Arthritis Maternal Grandfather    Hypertension Son    Esophageal cancer Neg Hx      Social History   Socioeconomic History   Marital status: Divorced    Spouse name: [name]   Number of children: 3   Years of education: [name]   Highest education level: High school graduate  Occupational History   [name]  Tobacco Use   Smoking status: Never    Passive exposure: Never   Smokeless tobacco: Never  Vaping Use   Vaping Use: Never used  Substance and  Sexual Activity   Alcohol use: No   Drug use: No   Sexual activity: Not Currently  Other Topics Concern   [name]  Social History Narrative   Lives at home alone   Retired   Caffeine: coffee   Social Determinants of Health   Financial Resource Strain: Low Risk  (10/17/2021)   Overall Financial Resource Strain (CARDIA)    Difficulty of Paying Living Expenses: Not hard at all  Food Insecurity: No Food Insecurity (10/17/2021)   Hunger Vital Sign    Worried About Running Out of Food in the Last Year: Never true    Ran Out of Food in the Last Year: Never true  Transportation Needs: No Transportation Needs (10/17/2021)   PRAPARE - Hydrologist (Medical): No    Lack of Transportation (Non-Medical): No  Physical Activity: Insufficiently Active (10/17/2021)   Exercise  Vital Sign    Days of Exercise per Week: 2 days    Minutes of Exercise per Session: 30 min  Stress: No Stress Concern Present (10/17/2021)   Harmony    Feeling of Stress : Not at all  Social Connections: Moderately Isolated (10/17/2021)   Social Connection and Isolation Panel [NHANES]    Frequency of Communication with Friends and Family: More than three times a week    Frequency of Social Gatherings with Friends and Family: More than three times a week    Attends Religious Services: More than 4 times per year    Active Member of [employer] or Organizations: No    Attends Archivist Meetings: Never    Marital Status: Divorced  Human resources officer Violence: Not At Risk (10/17/2021)   Humiliation, Afraid, Rape, and Kick questionnaire    Fear of Current or Ex-Partner: No    Emotionally Abused: No    Physically Abused: No    Sexually Abused: No     Allergies  Allergen Reactions   Benazepril Anaphylaxis, Swelling and Hives    angioedema Throat and lip swelling   Ondansetron Hcl Hives    Redness and hives post IV admin on 07/05/17   Codeine Nausea And Vomiting   Morphine Hives    Redness and hives noted post IV admin on 07/05/17     Outpatient Medications Prior to Visit  Medication Sig Dispense Refill   acetaminophen (TYLENOL) 500 MG tablet Take 500 mg by mouth in the morning.     acyclovir (ZOVIRAX) 400 MG tablet TAKE ONE (1) TABLET BY MOUTH TWO (2) TIMES DAILY 60 tablet 2   albuterol (PROVENTIL) (2.5 MG/3ML) 0.083% nebulizer solution Take 3 mLs (2.5 mg total) by nebulization every 6 (six) hours as needed for wheezing or shortness of breath. 150 mL 2   albuterol (VENTOLIN HFA) 108 (90 Base) MCG/ACT inhaler Inhale 2 puffs into the lungs every 6 (six) hours as needed for wheezing or shortness of breath. 18 g 5   aspirin EC 81 MG tablet Take 1 tablet (81 mg total) by mouth daily. (Patient taking differently: Take 81  mg by mouth every evening.) 90 tablet 1   cetirizine (ZYRTEC) 10 MG tablet Take 10 mg by mouth at bedtime.     Cholecalciferol (VITAMIN D3) 50 MCG (2000 UT) TABS Take 2,000 Units by mouth in the morning.     colestipol (COLESTID) 1 g tablet Take 3-4 tablets (3-4 g total) by mouth every 12 (twelve) hours. (Patient taking differently: Take 2 g by  mouth every 12 (twelve) hours.) 240 tablet 3   dicyclomine (BENTYL) 10 MG capsule Take 1 capsule (10 mg total) by mouth 4 (four) times daily -  before meals and at bedtime. 120 capsule 2   diphenoxylate-atropine (LOMOTIL) 2.5-0.025 MG tablet Take 1 tablet by mouth 4 (four) times daily as needed for diarrhea or loose stools. 100 tablet 0   fluticasone (FLONASE) 50 MCG/ACT nasal spray Place 2 sprays into both nostrils daily. 16 g 0   furosemide (LASIX) 20 MG tablet Take 20 mg by mouth daily as needed (fluid retention (feet swelling)).     hyoscyamine (LEVSIN SL) 0.125 MG SL tablet Place 1 tablet (0.125 mg total) under the tongue every 4 (four) hours as needed. 30 tablet 1   lidocaine (LIDODERM) 5 % Place 1 patch onto the skin daily. Remove & Discard patch within 12 hours or as directed by MD 15 patch 0   loperamide (IMODIUM) 2 MG capsule Take 1 capsule (2 mg total) by mouth 4 (four) times daily as needed for diarrhea or loose stools. 12 capsule 0   losartan (COZAAR) 50 MG tablet Take 50 mg by mouth in the morning and at bedtime.     Magnesium Glycinate 100 MG CAPS Take 400 mg by mouth at bedtime.     methocarbamol (ROBAXIN) 500 MG tablet Take 2 tablets (1,000 mg total) by mouth at bedtime as needed for muscle spasms. 20 tablet 0   metoprolol succinate (TOPROL-XL) 50 MG 24 hr tablet Take 1 tablet by mouth daily.     oxyCODONE-acetaminophen (PERCOCET/ROXICET) 5-325 MG tablet Take 1 tablet by mouth every 6 (six) hours as needed for severe pain. 30 tablet 0   potassium chloride SA (KLOR-CON M) 20 MEQ tablet Take 1 tablet (20 mEq total) by mouth daily. 30 tablet 2    promethazine (PHENERGAN) 25 MG tablet Take 1 tablet (25 mg total) by mouth every 8 (eight) hours as needed for up to 15 doses for nausea or vomiting. 15 tablet 0   spironolactone (ALDACTONE) 25 MG tablet Take 12.5 mg by mouth every evening.     sucralfate (CARAFATE) 1 g tablet Take 1 tablet (1 g total) by mouth 4 (four) times daily -  with meals and at bedtime. 120 tablet 2   Facility-Administered Medications Prior to Visit  Medication Dose Route Frequency Provider Last Rate Last Admin   aspirin chewable tablet 162 mg  162 mg Oral Once Mosie Lukes, MD       nitroGLYCERIN (NITROSTAT) SL tablet 0.4 mg  0.4 mg Sublingual Once Mosie Lukes, MD        Review of Systems  Constitutional:  Negative for chills, fever, malaise/fatigue and weight loss.  HENT:  Negative for congestion, sinus pain and sore throat.   Eyes: Negative.   Respiratory:  Positive for shortness of breath. Negative for cough, hemoptysis, sputum production and wheezing.   Cardiovascular:  Negative for chest pain, palpitations, orthopnea, claudication and leg swelling.  Gastrointestinal:  Positive for abdominal pain, diarrhea and heartburn. Negative for nausea and vomiting.  Genitourinary: Negative.   Musculoskeletal:  Positive for joint pain. Negative for myalgias.  Skin:  Negative for rash.  Neurological:  Negative for weakness.  Endo/Heme/Allergies: Negative.   Psychiatric/Behavioral: Negative.      Objective:   Vitals:   12/31/22 1425  BP: 138/84  Pulse: 92  SpO2: 98%  Weight: 203 lb 12.8 oz (92.4 kg)  Height: 5' 6"$  (1.676 m)   Physical Exam Constitutional:  General: She is not in acute distress.    Appearance: She is not ill-appearing.  HENT:     Head: Normocephalic and atraumatic.  Eyes:     General: No scleral icterus.    Conjunctiva/sclera: Conjunctivae normal.     Pupils: Pupils are equal, round, and reactive to light.  Cardiovascular:     Rate and Rhythm: Normal rate and regular rhythm.      Pulses: Normal pulses.     Heart sounds: Normal heart sounds. No murmur heard. Pulmonary:     Effort: Pulmonary effort is normal.     Breath sounds: Normal breath sounds. No wheezing, rhonchi or rales.  Abdominal:     General: Bowel sounds are normal.     Palpations: Abdomen is soft.  Musculoskeletal:     Right lower leg: No edema.     Left lower leg: No edema.  Lymphadenopathy:     Cervical: No cervical adenopathy.  Skin:    General: Skin is warm and dry.  Neurological:     General: No focal deficit present.     Mental Status: She is alert.  Psychiatric:        Mood and Affect: Mood normal.        Behavior: Behavior normal.        Thought Content: Thought content normal.        Judgment: Judgment normal.     CBC    Component Value Date/Time   WBC 2.5 (L) 12/24/2022 1528   RBC 4.10 12/24/2022 1528   HGB 11.8 (L) 12/24/2022 1528   HGB 11.8 (L) 11/27/2022 0849   HGB 11.1 (L) 11/08/2017 1127   HCT 36.5 12/24/2022 1528   HCT 34.1 (L) 11/08/2017 1127   PLT 103 (L) 12/24/2022 1528   PLT 110 (L) 11/27/2022 0849   PLT 145 11/08/2017 1127   MCV 89.0 12/24/2022 1528   MCV 88 11/08/2017 1127   MCH 28.8 12/24/2022 1528   MCHC 32.3 12/24/2022 1528   RDW 13.8 12/24/2022 1528   RDW 14.6 11/08/2017 1127   LYMPHSABS 1.2 12/18/2022 1515   LYMPHSABS 0.9 11/08/2017 1127   MONOABS 0.3 12/18/2022 1515   EOSABS 0.1 12/18/2022 1515   EOSABS 0.1 11/08/2017 1127   BASOSABS 0.0 12/18/2022 1515   BASOSABS 0.0 11/08/2017 1127      Latest Ref Rng & Units 12/24/2022    3:28 PM 12/18/2022    4:07 PM 11/30/2022    4:42 PM  BMP  Glucose 70 - 99 mg/dL 104  97  92   BUN 8 - 23 mg/dL 19  19  19   $ Creatinine 0.44 - 1.00 mg/dL 0.94  0.70  0.78   Sodium 135 - 145 mmol/L 134  135  137   Potassium 3.5 - 5.1 mmol/L 4.3  4.0  4.2   Chloride 98 - 111 mmol/L 104  105  106   CO2 22 - 32 mmol/L 25  24  24   $ Calcium 8.9 - 10.3 mg/dL 8.8  8.5  9.1    Chest imaging: CXR 09/17/22 Multilead left-sided  pacemaker in place. Stable heart size and mediastinal contours, heart upper normal in size. There is no focal airspace disease, pleural effusion or pneumothorax. Linear oriented density projecting over the posterior right seventh rib is unchanged, significance unknown. Scoliotic curvature of the spine.  PFT:     No data to display          Labs:  Path:  Echo:  Heart Catheterization:  Assessment & Plan:   Mild intermittent asthma without complication - Plan: budesonide (PULMICORT) 0.5 MG/2ML nebulizer solution  Discussion: Azka Wickenhauser is a 67 year old woman, never smoker with history of OSA on CPAP, multiple myeloma, congestive heart failure, and chronic diarrhea who is referred to pulmonary clinic for asthma.   She prefers to use nebulized medication, so recommend starting budesonide 0.21m twice daily and continue albuterol inhaler as needed for her asthma.   She is to follow up in 4 months with pulmonary function tests.  JFreda Jackson MD [facility] Office: [phone]   Current Outpatient Medications:    acetaminophen (TYLENOL) 500 MG tablet, Take 500 mg by mouth in the morning., Disp: , Rfl:    acyclovir (ZOVIRAX) 400 MG tablet, TAKE ONE (1) TABLET BY MOUTH TWO (2) TIMES DAILY, Disp: 60 tablet, Rfl: 2   albuterol (PROVENTIL) (2.5 MG/3ML) 0.083% nebulizer solution, Take 3 mLs (2.5 mg total) by nebulization every 6 (six) hours as needed for wheezing or shortness of breath., Disp: 150 mL, Rfl: 2   albuterol (VENTOLIN HFA) 108 (90 Base) MCG/ACT inhaler, Inhale 2 puffs into the lungs every 6 (six) hours as needed for wheezing or shortness of breath., Disp: 18 g, Rfl: 5   aspirin EC 81 MG tablet, Take 1 tablet (81 mg total) by mouth daily. (Patient taking differently: Take 81 mg by mouth every evening.), Disp: 90 tablet, Rfl: 1   budesonide (PULMICORT) 0.5 MG/2ML nebulizer solution, Take 2 mLs (0.5 mg total) by nebulization 2 (two) times daily as  needed (cough, wheezing or shortness of breath)., Disp: 120 mL, Rfl: 11   cetirizine (ZYRTEC) 10 MG tablet, Take 10 mg by mouth at bedtime., Disp: , Rfl:    Cholecalciferol (VITAMIN D3) 50 MCG (2000 UT) TABS, Take 2,000 Units by mouth in the morning., Disp: , Rfl:    colestipol (COLESTID) 1 g tablet, Take 3-4 tablets (3-4 g total) by mouth every 12 (twelve) hours. (Patient taking differently: Take 2 g by mouth every 12 (twelve) hours.), Disp: 240 tablet, Rfl: 3   dicyclomine (BENTYL) 10 MG capsule, Take 1 capsule (10 mg total) by mouth 4 (four) times daily -  before meals and at bedtime., Disp: 120 capsule, Rfl: 2   diphenoxylate-atropine (LOMOTIL) 2.5-0.025 MG tablet, Take 1 tablet by mouth 4 (four) times daily as needed for diarrhea or loose stools., Disp: 100 tablet, Rfl: 0   fluticasone (FLONASE) 50 MCG/ACT nasal spray, Place 2 sprays into both nostrils daily., Disp: 16 g, Rfl: 0   furosemide (LASIX) 20 MG tablet, Take 20 mg by mouth daily as needed (fluid retention (feet swelling))., Disp: , Rfl:    hyoscyamine (LEVSIN SL) 0.125 MG SL tablet, Place 1 tablet (0.125 mg total) under the tongue every 4 (four) hours as needed., Disp: 30 tablet, Rfl: 1   lidocaine (LIDODERM) 5 %, Place 1 patch onto the skin daily. Remove & Discard patch within 12 hours or as directed by MD, Disp: 15 patch, Rfl: 0   loperamide (IMODIUM) 2 MG capsule, Take 1 capsule (2 mg total) by mouth 4 (four) times daily as needed for diarrhea or loose stools., Disp: 12 capsule, Rfl: 0   losartan (COZAAR) 50 MG tablet, Take 50 mg by mouth in the morning and at bedtime., Disp: , Rfl:    Magnesium Glycinate 100 MG CAPS, Take 400 mg by mouth at bedtime., Disp: , Rfl:    methocarbamol (ROBAXIN) 500 MG tablet, Take 2 tablets (1,000  mg total) by mouth at bedtime as needed for muscle spasms., Disp: 20 tablet, Rfl: 0   metoprolol succinate (TOPROL-XL) 50 MG 24 hr tablet, Take 1 tablet by mouth daily., Disp: , Rfl:    oxyCODONE-acetaminophen  (PERCOCET/ROXICET) 5-325 MG tablet, Take 1 tablet by mouth every 6 (six) hours as needed for severe pain., Disp: 30 tablet, Rfl: 0   potassium chloride SA (KLOR-CON M) 20 MEQ tablet, Take 1 tablet (20 mEq total) by mouth daily., Disp: 30 tablet, Rfl: 2   promethazine (PHENERGAN) 25 MG tablet, Take 1 tablet (25 mg total) by mouth every 8 (eight) hours as needed for up to 15 doses for nausea or vomiting., Disp: 15 tablet, Rfl: 0   spironolactone (ALDACTONE) 25 MG tablet, Take 12.5 mg by mouth every evening., Disp: , Rfl:    sucralfate (CARAFATE) 1 g tablet, Take 1 tablet (1 g total) by mouth 4 (four) times daily -  with meals and at bedtime., Disp: 120 tablet, Rfl: 2  Current Facility-Administered Medications:    aspirin chewable tablet 162 mg, 162 mg, Oral, Once, Mosie Lukes, MD   nitroGLYCERIN (NITROSTAT) SL tablet 0.4 mg, 0.4 mg, Sublingual, Once, Mosie Lukes, MD

## 2023-01-01 ENCOUNTER — Telehealth: Payer: Self-pay | Admitting: Family Medicine

## 2023-01-01 ENCOUNTER — Encounter: Payer: Self-pay | Admitting: Hematology & Oncology

## 2023-01-01 ENCOUNTER — Inpatient Hospital Stay (HOSPITAL_BASED_OUTPATIENT_CLINIC_OR_DEPARTMENT_OTHER): Payer: 59 | Admitting: Hematology & Oncology

## 2023-01-01 ENCOUNTER — Inpatient Hospital Stay: Payer: 59 | Attending: Hematology & Oncology

## 2023-01-01 VITALS — BP 140/93 | HR 113 | Temp 98.3°F | Resp 20 | Ht 66.0 in | Wt 195.0 lb

## 2023-01-01 DIAGNOSIS — D801 Nonfamilial hypogammaglobulinemia: Secondary | ICD-10-CM

## 2023-01-01 DIAGNOSIS — C9000 Multiple myeloma not having achieved remission: Secondary | ICD-10-CM

## 2023-01-01 DIAGNOSIS — R197 Diarrhea, unspecified: Secondary | ICD-10-CM | POA: Insufficient documentation

## 2023-01-01 LAB — CBC WITH DIFFERENTIAL (CANCER CENTER ONLY)
Abs Immature Granulocytes: 0 10*3/uL (ref 0.00–0.07)
Basophils Absolute: 0 10*3/uL (ref 0.0–0.1)
Basophils Relative: 1 %
Eosinophils Absolute: 0.1 10*3/uL (ref 0.0–0.5)
Eosinophils Relative: 3 %
HCT: 38.1 % (ref 36.0–46.0)
Hemoglobin: 12.1 g/dL (ref 12.0–15.0)
Immature Granulocytes: 0 %
Lymphocytes Relative: 38 %
Lymphs Abs: 0.8 10*3/uL (ref 0.7–4.0)
MCH: 28.4 pg (ref 26.0–34.0)
MCHC: 31.8 g/dL (ref 30.0–36.0)
MCV: 89.4 fL (ref 80.0–100.0)
Monocytes Absolute: 0.2 10*3/uL (ref 0.1–1.0)
Monocytes Relative: 10 %
Neutro Abs: 1 10*3/uL — ABNORMAL LOW (ref 1.7–7.7)
Neutrophils Relative %: 48 %
Platelet Count: 132 10*3/uL — ABNORMAL LOW (ref 150–400)
RBC: 4.26 MIL/uL (ref 3.87–5.11)
RDW: 13.7 % (ref 11.5–15.5)
WBC Count: 2.2 10*3/uL — ABNORMAL LOW (ref 4.0–10.5)
nRBC: 0 % (ref 0.0–0.2)

## 2023-01-01 LAB — CMP (CANCER CENTER ONLY)
ALT: 17 U/L (ref 0–44)
AST: 34 U/L (ref 15–41)
Albumin: 3.9 g/dL (ref 3.5–5.0)
Alkaline Phosphatase: 50 U/L (ref 38–126)
Anion gap: 8 (ref 5–15)
BUN: 13 mg/dL (ref 8–23)
CO2: 27 mmol/L (ref 22–32)
Calcium: 9.9 mg/dL (ref 8.9–10.3)
Chloride: 104 mmol/L (ref 98–111)
Creatinine: 0.92 mg/dL (ref 0.44–1.00)
GFR, Estimated: >60 mL/min (ref >60)
Glucose, Bld: 120 mg/dL — ABNORMAL HIGH (ref 70–99)
Potassium: 3.8 mmol/L (ref 3.5–5.1)
Sodium: 139 mmol/L (ref 135–145)
Total Bilirubin: 0.4 mg/dL (ref 0.3–1.2)
Total Protein: 9.3 g/dL — ABNORMAL HIGH (ref 6.5–8.1)

## 2023-01-01 LAB — LACTATE DEHYDROGENASE: LDH: 216 U/L — ABNORMAL HIGH (ref 98–192)

## 2023-01-01 NOTE — Telephone Encounter (Signed)
Pt notified & will keep her appointment in April with dr.blyth

## 2023-01-01 NOTE — Telephone Encounter (Signed)
Pt came in office stating from her last visit she had labs done and that labs were not that best results due to her not taking her meds, pt wanted provider to know that after taking her meds (20 Dificid 200 Mg tab 1 tablet 2 times a day)  that was giving to her at the hospital that same wk has made her feel a lot better, pt wanted to know since she had to do stool test from the hospital and at her specialist office, pt mentioned that the results was going to be sent to our office and that she wanted for provider to inform her about results since she has not heard from anyone about that test. Please advise. Pt tel [phone]

## 2023-01-01 NOTE — Progress Notes (Signed)
Hematology and Oncology Follow Up Visit  Tami [name] [id number] Dec 25, 1955 67 y.o. 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  She is feeling a lot better.  Apparently, she had C. difficile.  This was treated with Dificid.  She is diarrhea is a lot better now.  She did not enjoy the [place].  She is not going to move down to [place].  As far as her myeloma is concerned, but we last saw her, her M spike was 1.9 g/dL.  Her IgG level was 338 8 mg/dL.  The Kappa light chain was 9.7 mg/dL.  Marland Kitchen  She has a pacemaker.  This has been working okay.  She has had no problems with nausea or vomiting.  She is eating okay.  Is no dysphagia or odynophagia.  She has had no problems with rashes.  There is been a little bit of a edema in the legs.  Overall, I would have said that her performance status is probably ECOG 1.     Medications:  Allergies as of 01/01/2023       Reactions   Benazepril Anaphylaxis, Swelling, Hives   angioedema Throat and lip swelling   Ondansetron Hcl Hives   Redness and hives post IV admin on 07/05/17   Codeine Nausea And Vomiting   Morphine Hives   Redness and hives noted post IV admin on 07/05/17        Medication List        Accurate as of January 01, 2023  5:08 PM. If you have any questions, ask your nurse or doctor.          acetaminophen 500 MG tablet Commonly known as: TYLENOL Take 500 mg by mouth in the morning.   acyclovir 400 MG tablet Commonly known as: ZOVIRAX TAKE ONE (1) TABLET BY MOUTH TWO (2) TIMES DAILY   albuterol 108 (90  Base) MCG/ACT inhaler Commonly known as: VENTOLIN HFA Inhale 2 puffs into the lungs every 6 (six) hours as needed for wheezing or shortness of breath.   albuterol (2.5 MG/3ML) 0.083% nebulizer solution Commonly known as: PROVENTIL Take 3 mLs (2.5 mg total) by nebulization every 6 (six) hours as needed for wheezing or shortness of breath.   aspirin EC 81 MG tablet Take 1 tablet (81 mg total) by mouth daily. What changed: when to take this   budesonide 0.5 MG/2ML nebulizer solution Commonly known as: Pulmicort Take 2 mLs (0.5 mg total) by nebulization 2 (two) times daily as needed (cough, wheezing or shortness of breath).   cetirizine 10 MG tablet Commonly known as: ZYRTEC Take 10 mg by mouth at bedtime.   colestipol 1 g tablet Commonly known as: COLESTID Take 3-4 tablets (3-4 g total) by mouth every 12 (twelve) hours. What changed: how much to take   dicyclomine 10 MG capsule Commonly known as: BENTYL Take 1 capsule (10 mg total) by mouth 4 (four) times daily -  before meals and at bedtime.   diphenoxylate-atropine 2.5-0.025 MG tablet Commonly known as: LOMOTIL Take 1 tablet by mouth  4 (four) times daily as needed for diarrhea or loose stools.   fluticasone 50 MCG/ACT nasal spray Commonly known as: FLONASE Place 2 sprays into both nostrils daily.   furosemide 20 MG tablet Commonly known as: LASIX Take 20 mg by mouth daily as needed (fluid retention (feet swelling)).   hyoscyamine 0.125 MG SL tablet Commonly known as: LEVSIN SL Place 1 tablet (0.125 mg total) under the tongue every 4 (four) hours as needed.   lidocaine 5 % Commonly known as: Lidoderm Place 1 patch onto the skin daily. Remove & Discard patch within 12 hours or as directed by MD   loperamide 2 MG capsule Commonly known as: IMODIUM Take 1 capsule (2 mg total) by mouth 4 (four) times daily as needed for diarrhea or loose stools.   losartan 50 MG tablet Commonly known as: COZAAR Take 50 mg by mouth in  the morning and at bedtime.   Magnesium Glycinate 100 MG Caps Take 400 mg by mouth at bedtime.   meloxicam 15 MG tablet Commonly known as: MOBIC Take 1 tablet by mouth.   methocarbamol 500 MG tablet Commonly known as: ROBAXIN Take 2 tablets (1,000 mg total) by mouth at bedtime as needed for muscle spasms.   metoprolol succinate 50 MG 24 hr tablet Commonly known as: TOPROL-XL Take 1 tablet by mouth daily.   oxyCODONE-acetaminophen 5-325 MG tablet Commonly known as: PERCOCET/ROXICET Take 1 tablet by mouth every 6 (six) hours as needed for severe pain.   potassium chloride SA 20 MEQ tablet Commonly known as: KLOR-CON M Take 1 tablet (20 mEq total) by mouth daily.   promethazine 25 MG tablet Commonly known as: PHENERGAN Take 1 tablet (25 mg total) by mouth every 8 (eight) hours as needed for up to 15 doses for nausea or vomiting.   spironolactone 25 MG tablet Commonly known as: ALDACTONE Take 12.5 mg by mouth every evening.   sucralfate 1 g tablet Commonly known as: Carafate Take 1 tablet (1 g total) by mouth 4 (four) times daily -  with meals and at bedtime.   Vitamin D3 50 MCG (2000 UT) Tabs Take 2,000 Units by mouth in the morning.        Allergies:  Allergies  Allergen Reactions   Benazepril Anaphylaxis, Swelling and Hives    angioedema Throat and lip swelling   Ondansetron Hcl Hives    Redness and hives post IV admin on 07/05/17   Codeine Nausea And Vomiting   Morphine Hives    Redness and hives noted post IV admin on 07/05/17    Past Medical History, Surgical history, Social history, and Family History were reviewed and updated.  Review of Systems: Review of Systems  Constitutional: Negative.   HENT: Negative.    Eyes: Negative.   Respiratory: Negative.    Cardiovascular: Negative.   Gastrointestinal: Negative.   Genitourinary: Negative.   Musculoskeletal: Negative.   Skin: Negative.   Neurological: Negative.   Endo/Heme/Allergies: Negative.    Psychiatric/Behavioral: Negative.        Physical Exam:  height is '5\' 6"'$  (1.676 m) and weight is 195 lb (88.5 kg). Her oral temperature is 98.3 F (36.8 C). Her blood pressure is 140/93 (abnormal) and her pulse is 113 (abnormal). Her respiration is 20 and oxygen saturation is 98%.   Wt Readings from Last 3 Encounters:  01/01/23 195 lb (88.5 kg)  12/31/22 203 lb 12.8 oz (92.4 kg)  12/24/22 194 lb (88 kg)    Physical Exam Vitals reviewed.  HENT:     Head: Normocephalic and atraumatic.  Eyes:     Pupils: Pupils are equal, round, and reactive to light.     Comments: Mild pallor of conjunctiva  Cardiovascular:     Rate and Rhythm: Normal rate and regular rhythm.     Heart sounds: Normal heart sounds.  Pulmonary:     Effort: Pulmonary effort is normal.     Breath sounds: Normal breath sounds.  Abdominal:     General: Bowel sounds are normal.     Palpations: Abdomen is soft.  Musculoskeletal:        General: No tenderness or deformity. Normal range of motion.     Cervical back: Normal range of motion.  Lymphadenopathy:     Cervical: No cervical adenopathy.  Skin:    General: Skin is warm and dry.     Findings: No erythema or rash.  Neurological:     Mental Status: She is alert and oriented to person, place, and time.  Psychiatric:        Behavior: Behavior normal.        Thought Content: Thought content normal.        Judgment: Judgment normal.      Lab Results  Component Value Date   WBC 2.2 (L) 01/01/2023   HGB 12.1 01/01/2023   HCT 38.1 01/01/2023   MCV 89.4 01/01/2023   PLT 132 (L) 01/01/2023   Lab Results  Component Value Date   FERRITIN 519 (H) 11/27/2022   IRON 77 11/27/2022   TIBC 332 11/27/2022   UIBC 255 11/27/2022   IRONPCTSAT 23 11/27/2022   Lab Results  Component Value Date   RETICCTPCT 1.2 11/27/2022   RBC 4.26 01/01/2023   RETICCTABS 32,640 07/16/2016   Lab Results  Component Value Date   KPAFRELGTCHN 97.0 (H) 11/27/2022   LAMBDASER  5.4 (L) 11/27/2022   KAPLAMBRATIO 17.96 (H) 11/27/2022   Lab Results  Component Value Date   IGGSERUM 3,388 (H) 11/27/2022   IGGSERUM QNSTST 11/27/2022   IGA 187 11/27/2022   IGA 216 11/27/2022   IGMSERUM 143 11/27/2022   IGMSERUM QNSTST 11/27/2022   Lab Results  Component Value Date   TOTALPROTELP 8.5 11/27/2022   ALBUMINELP 3.4 11/27/2022   A1GS 0.2 11/27/2022   A2GS 0.9 11/27/2022   BETS 1.1 11/27/2022   BETA2SER 0.5 07/16/2016   GAMS 2.9 (H) 11/27/2022   MSPIKE 1.9 (H) 11/27/2022   SPEI Comment 10/27/2022     Chemistry      Component Value Date/Time   NA 139 01/01/2023 1009   NA 143 11/08/2017 1127   NA 140 12/31/2016 1000   K 3.8 01/01/2023 1009   K 4.0 11/08/2017 1127   K 3.6 12/31/2016 1000   CL 104 01/01/2023 1009   CL 107 11/08/2017 1127   CO2 27 01/01/2023 1009   CO2 25 11/08/2017 1127   CO2 24 12/31/2016 1000   BUN 13 01/01/2023 1009   BUN 19 11/08/2017 1127   BUN 22.3 12/31/2016 1000   CREATININE 0.92 01/01/2023 1009   CREATININE 0.9 11/08/2017 1127   CREATININE 0.8 12/31/2016 1000      Component Value Date/Time   CALCIUM 9.9 01/01/2023 1009   CALCIUM 9.9 11/08/2017 1127   CALCIUM 9.5 12/31/2016 1000   ALKPHOS 50 01/01/2023 1009   ALKPHOS 44 11/08/2017 1127   ALKPHOS 51 12/31/2016 1000   AST 34 01/01/2023 1009   AST 28 12/31/2016 1000   ALT 17 01/01/2023 1009  ALT 36 11/08/2017 1127   ALT 16 12/31/2016 1000   BILITOT 0.4 01/01/2023 1009   BILITOT 0.46 12/31/2016 1000       Impression and Plan: Ms. Palazzi is a very pleasant 67 yo African American female with a previous IgG kappa MGUS with progression to myeloma.   We will have to see what her monoclonal studies look like.  For right now, I still do not see that we have to embark on any therapy.  I will still plan to get her back to see [place] in another 4 weeks or so.  We are checking her iron levels.  I am glad that the diarrhea is better.  I know this was affecting her life.  I realize that  the levels have been up a little bit.  However, she really has had a tough time with treatment.  We had to be very cautious if we do have to treat her.  I am just happy that the diarrhea is doing better.  This really has been the determinant of her quality of life.  We will plan to get her back in another month or so.  It would be nice to try to get her through the Winter.     [name], MD 2/23/20245:08 PM

## 2023-01-03 LAB — IGG, IGA, IGM
IgA: 180 mg/dL (ref 87–352)
IgG (Immunoglobin G), Serum: 3407 mg/dL — ABNORMAL HIGH (ref 586–1602)
IgM (Immunoglobulin M), Srm: 145 mg/dL (ref 26–217)

## 2023-01-04 ENCOUNTER — Ambulatory Visit: Payer: 59 | Admitting: Family Medicine

## 2023-01-04 ENCOUNTER — Telehealth: Payer: Self-pay | Admitting: Pulmonary Disease

## 2023-01-04 LAB — KAPPA/LAMBDA LIGHT CHAINS
Kappa free light chain: 99.8 mg/L — ABNORMAL HIGH (ref 3.3–19.4)
Kappa, lambda light chain ratio: 13.86 — ABNORMAL HIGH (ref 0.26–1.65)
Lambda free light chains: 7.2 mg/L (ref 5.7–26.3)

## 2023-01-04 NOTE — Telephone Encounter (Signed)
PT needs nebulizer medication called in. The electronic system is down so we need to fax in RX to [phone] or call them.  Pharm is [facility] @ [phone]  Pls call pt to advise @ [phone]

## 2023-01-04 NOTE — Telephone Encounter (Signed)
Called and spoke with patient. Advised patient I would call the pharmacy. Called patients pharmacy. Rx has been taken of by the pharmacy.   Nothing further needed.

## 2023-01-05 ENCOUNTER — Ambulatory Visit: Payer: 59 | Admitting: Family Medicine

## 2023-01-05 ENCOUNTER — Inpatient Hospital Stay: Payer: 59

## 2023-01-05 VITALS — BP 135/79 | HR 80 | Temp 98.3°F | Resp 18

## 2023-01-05 DIAGNOSIS — C9000 Multiple myeloma not having achieved remission: Secondary | ICD-10-CM | POA: Diagnosis not present

## 2023-01-05 DIAGNOSIS — R197 Diarrhea, unspecified: Secondary | ICD-10-CM | POA: Diagnosis not present

## 2023-01-05 DIAGNOSIS — D500 Iron deficiency anemia secondary to blood loss (chronic): Secondary | ICD-10-CM

## 2023-01-05 MED ORDER — SODIUM CHLORIDE 0.9 % IV SOLN: INTRAVENOUS | Status: DC

## 2023-01-05 NOTE — Patient Instructions (Signed)
Diarrhea, Adult Diarrhea is when you pass loose and sometimes watery poop (stool) often. Diarrhea can make you feel weak and cause you to lose water in your body (get dehydrated). Losing water in your body can cause you to: Feel tired and thirsty. Have a dry mouth. Go pee (urinate) less often. Diarrhea often lasts 2-3 days. It can last longer if it is a sign of something more serious. Be sure to treat your diarrhea as told by your doctor. Follow these instructions at home: Eating and drinking     Follow these instructions as told by your doctor: Take an ORS (oral rehydration solution). This is a drink that helps you replace fluids and minerals your body lost. It is sold at pharmacies and stores. Drink enough fluid to keep your pee (urine) pale yellow. Drink fluids such as: Water. You can also get fluids by sucking on ice chips. Diluted fruit juice. Low-calorie sports drinks. Milk. Avoid drinking fluids that have a lot of sugar or caffeine in them. These include soda, energy drinks, and regular sports drinks. Avoid alcohol. Eat bland, easy-to-digest foods in small amounts as you are able. These foods include: Bananas. Applesauce. Rice. Low-fat (lean) meats. Toast. Crackers. Avoid spicy or fatty foods.  Medicines Take over-the-counter and prescription medicines only as told by your doctor. If you were prescribed antibiotics, take them as told by your doctor. Do not stop taking them even if you start to feel better. General instructions  Wash your hands often using soap and water for 20 seconds. If soap and water are not available, use hand sanitizer. Others in your home should wash their hands as well. Wash your hands: After using the toilet or changing a diaper. Before preparing, cooking, or serving food. While caring for a sick person. While visiting someone in a hospital. Rest at home while you get better. Take a warm bath to help with any burning or pain from having  diarrhea. Watch your condition for any changes. Contact a doctor if: You have a fever. Your diarrhea gets worse. You have new symptoms. You vomit every time you eat or drink. You feel light-headed, dizzy, or you have a headache. You have muscle cramps. You have signs of losing too much water in your body, such as: Dark pee, very little pee, or no pee. Cracked lips. Dry mouth. Sunken eyes. Sleepiness. Weakness. You have bloody or black poop or poop that looks like tar. You have very bad pain, cramping, or bloating in your belly (abdomen). Your skin feels cold and clammy. You feel confused. Get help right away if: You have chest pain. Your heart is beating very quickly. You have trouble breathing or you are breathing very quickly. You feel very weak or you faint. These symptoms may be an emergency. Get help right away. Call 911. Do not wait to see if the symptoms will go away. Do not drive yourself to the hospital. This information is not intended to replace advice given to you by your health care provider. Make sure you discuss any questions you have with your health care provider. Document Revised: 04/14/2022 Document Reviewed: 04/14/2022 Elsevier Patient Education  [place].

## 2023-01-06 ENCOUNTER — Telehealth: Payer: Self-pay | Admitting: Family Medicine

## 2023-01-06 ENCOUNTER — Ambulatory Visit (INDEPENDENT_AMBULATORY_CARE_PROVIDER_SITE_OTHER): Payer: 59 | Admitting: Family Medicine

## 2023-01-06 ENCOUNTER — Encounter: Payer: Self-pay | Admitting: Family Medicine

## 2023-01-06 VITALS — BP 140/74 | HR 84 | Temp 97.9°F | Ht 66.0 in | Wt 193.5 lb

## 2023-01-06 DIAGNOSIS — G8929 Other chronic pain: Secondary | ICD-10-CM

## 2023-01-06 DIAGNOSIS — R109 Unspecified abdominal pain: Secondary | ICD-10-CM

## 2023-01-06 DIAGNOSIS — I10 Essential (primary) hypertension: Secondary | ICD-10-CM | POA: Diagnosis not present

## 2023-01-06 MED ORDER — SPIRONOLACTONE 25 MG PO TABS: 25.0000 mg | ORAL_TABLET | Freq: Every day | ORAL | Status: DC

## 2023-01-06 MED ORDER — NORTRIPTYLINE HCL 10 MG PO CAPS: 10.0000 mg | ORAL_CAPSULE | Freq: Every day | ORAL | 1 refills | Status: DC

## 2023-01-06 NOTE — Progress Notes (Signed)
Chief Complaint  Patient presents with   Hypertension    Stomach pain     Subjective Tami [name] is a 67 y.o. female who presents for hypertension follow up. She does monitor home blood pressures. Elevated over the past mo.  Blood pressures ranging from 150's/90-100's on average. She is compliant with medications- losartan 50 mg bid, Toprol XL 50 mg/d, spironolactone 12.5 mg/d. Patient has these side effects of medication: none She is sometimes adhering to a healthy diet overall. Current exercise: stays active No CP or SOB.   2 yrs of chronic abd pain. Recently had GI eval and it was unremarkable. She has fam members w IBS. Thinks she might have that. Has never had any medication for IBS.    Past Medical History:  Diagnosis Date   Abnormal SPEP 07/26/2016   Arthritis    knees, hands   Back pain 07/14/2016   Bowel obstruction ([place]) 11/2014   C. 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   Iron deficiency anemia due to chronic blood loss 05/28/2022   Low back pain 07/14/2016   Monoclonal gammopathy of unknown significance (MGUS) 09/16/2016   Multiple myeloma ([place]) 07/20/2018   Multiple myeloma not having achieved remission ([place]) 07/20/2018   Myalgia 12/31/2016   Obstructive sleep apnea 03/31/2015   Presence of permanent cardiac pacemaker    SOB (shortness of breath) 04/09/2016   Stroke (HCC)    TIAs   UTI (urinary tract infection)     Exam BP (!) 140/74 (BP Location: Left Arm, Cuff Size: Large)   Pulse 84   Temp 97.9 F (36.6 C) (Oral)   Ht '5\' 6"'$  ([id number] m)   Wt 193 lb 8 oz (87.8 kg)   SpO2 95%   BMI 31.23 kg/m  General:  well developed, well nourished, in no  apparent distress Heart: RRR, no bruits, no LE edema Lungs: clear to auscultation, no accessory muscle use Psych: well oriented with normal range of affect and appropriate judgment/insight  Primary hypertension  Chronic abdominal pain - Plan: nortriptyline (PAMELOR) 10 MG capsule  Chronic, uncontrolled. Cont Toprol XL 50 mg/d, losartan 50 mg bid. Increase Aldactone to 25 mg/d. Monitor BP at home.  Counseled on diet and exercise. F/u in 2 weeks. Chronic, uncontrolled. Start nortriptyline 10 mg qhs. The patient voiced understanding and agreement to the plan.  [place], DO 01/06/23  4:21 PM

## 2023-01-06 NOTE — Telephone Encounter (Signed)
Pt said [facility] efax is not working and we have to call in the medications ( spironolactone (ALDACTONE) 25 MG tablet  and nortriptyline (PAMELOR) 10 MG capsule ) or fax it to them. Pt is waiting at the pharmacy.

## 2023-01-06 NOTE — Patient Instructions (Addendum)
Keep the diet clean and stay active.  Check your blood pressures 2-3 times per week, alternating the time of day you check it. If it is high, considering waiting 1-2 minutes and rechecking. If it gets higher, your anxiety is likely creeping up and we should avoid rechecking.   Take the spironolactone once daily, a full tab. Stay on everything else for now.   Let us know if you need anything.

## 2023-01-06 NOTE — Telephone Encounter (Signed)
Called in both prescriptions

## 2023-01-07 ENCOUNTER — Telehealth: Payer: Self-pay | Admitting: Family Medicine

## 2023-01-07 NOTE — Telephone Encounter (Signed)
Called pt was advised that's I spoke with  Dr.Blyth regarding medication, Pt stated she understand will try it out.

## 2023-01-07 NOTE — Telephone Encounter (Signed)
Pt called stating was seen yesterday with Dr. Nani Ravens for Chronic Abdominal pain (pt having diarrhea) and pt was prescribed nortriptyline (PAMELOR) 10 MG capsule - pt stated that she read the indication for the meds

## 2023-01-07 NOTE — Telephone Encounter (Signed)
Pt stated that she read the indication for the meds and that it mentioned it is for depression, pt is uncertain and not sure if she needed to take this meds since for pt understanding meds is for depression and not for Chronic abdominal pain. Pt would like to know asap if she does take the meds or not (and if not to please give her meds that will help her diarrhea and abdominal pain. Please advise. ASAP. Call pt at [phone]

## 2023-01-08 LAB — IMMUNOFIXATION REFLEX, SERUM
IgA: 172 mg/dL (ref 87–352)
IgG (Immunoglobin G), Serum: 3412 mg/dL — ABNORMAL HIGH (ref 586–1602)
IgM (Immunoglobulin M), Srm: 142 mg/dL (ref 26–217)

## 2023-01-08 LAB — PROTEIN ELECTROPHORESIS, SERUM, WITH REFLEX
A/G Ratio: 0.7 (ref 0.7–1.7)
Albumin ELP: 3.4 g/dL (ref 2.9–4.4)
Alpha-1-Globulin: 0.2 g/dL (ref 0.0–0.4)
Alpha-2-Globulin: 0.9 g/dL (ref 0.4–1.0)
Beta Globulin: 1.1 g/dL (ref 0.7–1.3)
Gamma Globulin: 2.8 g/dL — ABNORMAL HIGH (ref 0.4–1.8)
Globulin, Total: 5.1 g/dL — ABNORMAL HIGH (ref 2.2–3.9)
M-Spike, %: 2.1 g/dL — ABNORMAL HIGH
SPEP Interpretation: 0
Total Protein ELP: 8.5 g/dL (ref 6.0–8.5)

## 2023-01-10 DIAGNOSIS — M5030 Other cervical disc degeneration, unspecified cervical region: Secondary | ICD-10-CM | POA: Diagnosis not present

## 2023-01-10 DIAGNOSIS — M542 Cervicalgia: Secondary | ICD-10-CM | POA: Diagnosis not present

## 2023-01-10 DIAGNOSIS — M4802 Spinal stenosis, cervical region: Secondary | ICD-10-CM | POA: Diagnosis not present

## 2023-01-10 DIAGNOSIS — R519 Headache, unspecified: Secondary | ICD-10-CM | POA: Diagnosis not present

## 2023-01-10 DIAGNOSIS — G4486 Cervicogenic headache: Secondary | ICD-10-CM | POA: Diagnosis not present

## 2023-01-11 ENCOUNTER — Telehealth: Payer: Self-pay | Admitting: *Deleted

## 2023-01-11 ENCOUNTER — Telehealth: Payer: Self-pay | Admitting: Family Medicine

## 2023-01-11 DIAGNOSIS — M542 Cervicalgia: Secondary | ICD-10-CM | POA: Diagnosis not present

## 2023-01-11 DIAGNOSIS — G4486 Cervicogenic headache: Secondary | ICD-10-CM | POA: Diagnosis not present

## 2023-01-11 MED ORDER — LIDOCAINE 5 % EX PTCH: 1.0000 | MEDICATED_PATCH | Freq: Two times a day (BID) | CUTANEOUS | 0 refills | Status: AC

## 2023-01-11 MED ORDER — METAXALONE 800 MG PO TABS: 800.0000 mg | ORAL_TABLET | Freq: Three times a day (TID) | ORAL | 0 refills | Status: DC

## 2023-01-11 NOTE — Telephone Encounter (Signed)
Pt came in office stating was seen in the ER yesterday, pt was given prescription for her visit at the ER but stated that her pharmacy was closed yesterday and could not get her rx- pt went today to pharmacy but is wanting to know if possible provider can give her rx sent to her pharmacy [facility] for Lidocaine (Lidoderm) 5% patch and also rx for metaxalone (Skelaxin) 800 mg tablet due to insurance will not cover complete the meds unless it is through her provider, (it will cost her $50.75 and $77.75. Pt was informed she is needing an ER fu appt with her provider but pt insisted that had saw her provider recently and is not wanting the appt. Pt left copy of her ER visit and copy of the rx form. Please advise. Pt tel [phone].

## 2023-01-11 NOTE — Telephone Encounter (Signed)
Patient came in again to check with meds  Sent shamaine a teams-- patient wants to wait until she hears something back

## 2023-01-11 NOTE — Telephone Encounter (Signed)
Dr. Charlett Blake gave verbal ok to write in her name and start pa.

## 2023-01-11 NOTE — Telephone Encounter (Signed)
Spoke with pt was advised we can't use the prescription from [facility] for P, Was advised Dr.Blyth will have to write prescription for [place] to do the PA.

## 2023-01-11 NOTE — Telephone Encounter (Signed)
Pt went to [facility] and she was prescribed Skelaxin '800mg'$  and lidocaine patches and they need prior auth.  Since ER does not cover them can you write them in your name?

## 2023-01-11 NOTE — Telephone Encounter (Signed)
Prior auth started via cover my meds.  Awaiting determination.  Lidocaine- Key: [id number] Metaxalone: KeyEG:[id number]

## 2023-01-12 ENCOUNTER — Other Ambulatory Visit: Payer: Self-pay

## 2023-01-12 ENCOUNTER — Telehealth: Payer: Self-pay | Admitting: Family Medicine

## 2023-01-12 DIAGNOSIS — G8929 Other chronic pain: Secondary | ICD-10-CM

## 2023-01-12 MED ORDER — METAXALONE 800 MG PO TABS: 800.0000 mg | ORAL_TABLET | Freq: Three times a day (TID) | ORAL | 0 refills | Status: AC

## 2023-01-12 MED ORDER — NORTRIPTYLINE HCL 10 MG PO CAPS: 10.0000 mg | ORAL_CAPSULE | Freq: Every day | ORAL | 1 refills | Status: DC

## 2023-01-12 MED ORDER — METAXALONE 800 MG PO TABS: 800.0000 mg | ORAL_TABLET | Freq: Three times a day (TID) | ORAL | 0 refills | Status: DC

## 2023-01-12 NOTE — Telephone Encounter (Signed)
Prior auth for lidocaine was denied she will otc lidocaine patches.

## 2023-01-12 NOTE — Telephone Encounter (Signed)
Pt needs a refill for potassium to be sent to [facility].

## 2023-01-12 NOTE — Telephone Encounter (Signed)
Medication sent.

## 2023-01-12 NOTE — Telephone Encounter (Signed)
Prior auth for metaxalone denied.  Pt notified and and advised she can get rx at [facility] for 27.92 with good rx.  Rx sent to [facility] and coupon faxed.  Advised we are still waiting on pa for lidocaine and that she may just want to pick up some 4% otc while she wait to see if this is even covered.

## 2023-01-15 ENCOUNTER — Other Ambulatory Visit: Payer: Self-pay | Admitting: *Deleted

## 2023-01-15 DIAGNOSIS — Z8619 Personal history of other infectious and parasitic diseases: Secondary | ICD-10-CM | POA: Diagnosis not present

## 2023-01-15 DIAGNOSIS — R197 Diarrhea, unspecified: Secondary | ICD-10-CM | POA: Diagnosis not present

## 2023-01-15 MED ORDER — POTASSIUM CHLORIDE CRYS ER 20 MEQ PO TBCR: 20.0000 meq | EXTENDED_RELEASE_TABLET | Freq: Every day | ORAL | 1 refills | Status: DC

## 2023-01-18 DIAGNOSIS — G4733 Obstructive sleep apnea (adult) (pediatric): Secondary | ICD-10-CM | POA: Diagnosis not present

## 2023-01-19 ENCOUNTER — Ambulatory Visit: Payer: 59 | Admitting: Internal Medicine

## 2023-01-19 ENCOUNTER — Ambulatory Visit: Payer: 59 | Admitting: Family Medicine

## 2023-01-19 DIAGNOSIS — M545 Low back pain, unspecified: Secondary | ICD-10-CM | POA: Diagnosis not present

## 2023-01-19 DIAGNOSIS — G4733 Obstructive sleep apnea (adult) (pediatric): Secondary | ICD-10-CM | POA: Diagnosis not present

## 2023-01-20 ENCOUNTER — Other Ambulatory Visit (HOSPITAL_BASED_OUTPATIENT_CLINIC_OR_DEPARTMENT_OTHER): Payer: Self-pay

## 2023-01-21 ENCOUNTER — Ambulatory Visit: Payer: Self-pay | Admitting: Licensed Clinical Social Worker

## 2023-01-21 NOTE — Patient Instructions (Signed)
Visit Information  Thank you for taking time to visit with me today. Please don't hesitate to contact me if I can be of assistance to you.   Following are the goals we discussed today:   Goals Addressed             This Visit's Progress    Get [place] and talk with RN Care manager       Activities and task to complete in order to accomplish goals.   We discussed [place], I will work with Dr. Randel Pigg to get this process started We also discussed your symptoms of depression, we will spend more time discussing this during our next call  I have scheduled a phone appointment with the [place] she will provide educational informational and assist you with managing your health needs         Our next appointment is by telephone on 01/26/23   Please call the care guide team at [phone] if you need to cancel or reschedule your appointment.   If you are experiencing a Mental Health or [place] or need someone to talk to, please call the Suicide and Crisis Lifeline: 988 call the [place] National Suicide Prevention Lifeline: 530-016-2674 or TTY: (509) 023-7047 TTY (570) 605-9760) to talk to a trained counselor call 1-800-273-TALK (toll free, 24 hour hotline) go to [facility] [phone])   The patient verbalized understanding of instructions, educational materials, and care plan provided today and DECLINED offer to receive copy of patient instructions, educational materials, and care plan.   Casimer Lanius, [place] [phone]

## 2023-01-21 NOTE — Patient Outreach (Signed)
  Care Coordination   Initial Visit Note   01/21/2023 Name: Tami [name] MRN: [id number] DOB: 03/24/1956  Tami [name] is a 67 y.o. year old female who sees Mosie Lukes, MD for primary care. I spoke with  [name] by phone today.  What matters to the patients health and wellness today?  Managing her health  Patient would like assistance with getting [place] will collaborate with PCP and CMA. PCS referral faxed to CMA to give to Dr. Randel Pigg.   Goals Addressed             This Visit's Progress    Get Personal Care Services and talk with RN Care manager       Activities and task to complete in order to accomplish goals.   We discussed [place], I will work with Dr. Randel Pigg to get this process started We also discussed your symptoms of depression, we will spend more time discussing this during our next call  I have scheduled a phone appointment with the [place] she will provide educational informational and assist you with managing your health needs        SDOH assessments and interventions completed:  Yes  SDOH Interventions Today    Flowsheet Row Most Recent Value  SDOH Interventions   Food Insecurity Interventions Intervention Not Indicated  Housing Interventions Intervention Not Indicated  Transportation Interventions Intervention Not Indicated  Utilities Interventions Intervention Not Indicated  Stress Interventions Intervention Not Indicated       Care Coordination Interventions:  Yes, provided  Interventions Today    Flowsheet Row Most Recent Value  Chronic Disease   Chronic disease during today's visit Hypertension (HTN), Congestive Heart Failure (CHF)  General Interventions   General Interventions Discussed/Reviewed General Interventions Discussed, Level of Care, Referral to Nurse, Communication with  [reviewed Care Coordination program]  Communication with RN, PCP/Specialists  [CMA]  Level of [place]  [would like assistance with getting PCS services]  Applications Personal Care Services  Education Interventions   Education Provided Provided Education  Applications [place] Discussed/Reviewed Mental Health Discussed, Depression       Follow up plan:  Referral made to RN care manager Follow up call scheduled for 01/26/23 with LCSW    Encounter Outcome:  Pt. Visit Completed   Casimer Lanius, [place] [phone]

## 2023-01-25 ENCOUNTER — Other Ambulatory Visit (HOSPITAL_BASED_OUTPATIENT_CLINIC_OR_DEPARTMENT_OTHER): Payer: Self-pay

## 2023-01-25 ENCOUNTER — Other Ambulatory Visit: Payer: Self-pay

## 2023-01-25 ENCOUNTER — Telehealth: Payer: Self-pay | Admitting: Family Medicine

## 2023-01-25 ENCOUNTER — Ambulatory Visit: Payer: 59 | Admitting: Internal Medicine

## 2023-01-25 ENCOUNTER — Ambulatory Visit (INDEPENDENT_AMBULATORY_CARE_PROVIDER_SITE_OTHER): Payer: 59 | Admitting: Internal Medicine

## 2023-01-25 VITALS — BP 120/80 | HR 87 | Temp 97.8°F | Wt 192.0 lb

## 2023-01-25 DIAGNOSIS — A0472 Enterocolitis due to Clostridium difficile, not specified as recurrent: Secondary | ICD-10-CM

## 2023-01-25 MED ORDER — SHINGRIX 50 MCG/0.5ML IM SUSR
INTRAMUSCULAR | 0 refills | Status: DC
  Filled 2023-01-25: qty 1, 1d supply, fill #0

## 2023-01-25 MED ORDER — DICYCLOMINE HCL 10 MG PO CAPS: 10.0000 mg | ORAL_CAPSULE | Freq: Three times a day (TID) | ORAL | 2 refills | Status: DC

## 2023-01-25 NOTE — Telephone Encounter (Signed)
Patient gets this through cardiology. Called pharmacy for them to send to cardiology to refill.  Left pt a message that I called pharmacy for them to send to cardiology since they are the ones managing it.

## 2023-01-25 NOTE — Telephone Encounter (Signed)
Medication: metoprolol succinate (TOPROL-XL) 50 MG 24 hr tablet [id number]   Ph[facility]   Patient also requesting we do 90 days instead of 30

## 2023-01-25 NOTE — Progress Notes (Signed)
RFV: follow up for functional diarrhea, remote hx of cdifficile  [name], female   DOB: October 10, 1956, 67 y.o.   MRN: [id number]  HPI  Tami [name] is a 67yo F with history of IBS -- still having diarrhea, still requiring IVF for rehydration.   Diarrhea with abdominal cramping. Doesn't recall having to take bentyl.   3-4 x per day with every other day. Trying to keep up with gatorade, pedialyte.  See Dr Lyndel Safe in the past.  Outpatient Encounter Medications as of 01/25/2023  Medication Sig   acetaminophen (TYLENOL) 500 MG tablet Take 500 mg by mouth in the morning.   acyclovir (ZOVIRAX) 400 MG tablet TAKE ONE (1) TABLET BY MOUTH TWO (2) TIMES DAILY   albuterol (PROVENTIL) (2.5 MG/3ML) 0.083% nebulizer solution Take 3 mLs (2.5 mg total) by nebulization every 6 (six) hours as needed for wheezing or shortness of breath.   albuterol (VENTOLIN HFA) 108 (90 Base) MCG/ACT inhaler Inhale 2 puffs into the lungs every 6 (six) hours as needed for wheezing or shortness of breath.   aspirin EC 81 MG tablet Take 1 tablet (81 mg total) by mouth daily. (Patient taking differently: Take 81 mg by mouth every evening.)   budesonide (PULMICORT) 0.5 MG/2ML nebulizer solution Take 2 mLs (0.5 mg total) by nebulization 2 (two) times daily as needed (cough, wheezing or shortness of breath).   cetirizine (ZYRTEC) 10 MG tablet Take 10 mg by mouth at bedtime.   Cholecalciferol (VITAMIN D3) 50 MCG (2000 UT) TABS Take 2,000 Units by mouth in the morning.   colestipol (COLESTID) 1 g tablet Take 3-4 tablets (3-4 g total) by mouth every 12 (twelve) hours. (Patient taking differently: Take 2 g by mouth every 12 (twelve) hours.)   fluticasone (FLONASE) 50 MCG/ACT nasal spray Place 2 sprays into both nostrils daily.   furosemide (LASIX) 20 MG tablet Take 20 mg by mouth daily as needed (fluid retention (feet swelling)).   hyoscyamine (LEVSIN SL) 0.125 MG SL tablet Place 1 tablet (0.125 mg total) under the tongue  every 4 (four) hours as needed.   lidocaine (LIDODERM) 5 % Place 1 patch onto the skin every 12 (twelve) hours. Remove and discard within 12 hours   loperamide (IMODIUM) 2 MG capsule Take 1 capsule (2 mg total) by mouth 4 (four) times daily as needed for diarrhea or loose stools.   losartan (COZAAR) 50 MG tablet Take 50 mg by mouth in the morning and at bedtime.   Magnesium Glycinate 100 MG CAPS Take 400 mg by mouth at bedtime.   meloxicam (MOBIC) 15 MG tablet Take 1 tablet by mouth.   metoprolol succinate (TOPROL-XL) 50 MG 24 hr tablet Take 1 tablet by mouth daily.   nortriptyline (PAMELOR) 10 MG capsule Take 1 capsule (10 mg total) by mouth at bedtime.   potassium chloride SA (KLOR-CON M) 20 MEQ tablet Take 1 tablet (20 mEq total) by mouth daily.   spironolactone (ALDACTONE) 25 MG tablet Take 1 tablet (25 mg total) by mouth daily.   sucralfate (CARAFATE) 1 g tablet Take 1 tablet (1 g total) by mouth 4 (four) times daily -  with meals and at bedtime.   dicyclomine (BENTYL) 10 MG capsule Take 1 capsule (10 mg total) by mouth 4 (four) times daily -  before meals and at bedtime.   Facility-Administered Encounter Medications as of 01/25/2023  Medication   aspirin chewable tablet 162 mg   nitroGLYCERIN (NITROSTAT) SL tablet 0.4 mg  Patient Active Problem List   Diagnosis Date Noted   Asthma 12/20/2022   Irritable bowel syndrome with diarrhea    Nausea and vomiting    Hypocalcemia 08/25/2022   Iron deficiency anemia due to chronic blood loss 05/28/2022   Chronic diarrhea 05/27/2022   Soft tissue lesion 04/28/2022   Obesity (BMI 30-39.9) 04/28/2022   SBO (small bowel obstruction) ([place]) 04/27/2022   Dehydration 04/03/2022   Acute vaginitis 03/18/2022   Cervical cancer screening 03/18/2022   Dizziness 12/11/2021   Visual changes 11/26/2021   Ear fullness, bilateral 08/12/2021   Bilateral knee pain 08/08/2021   History of 2019 novel coronavirus disease (COVID-19) 12/06/2020   Low  vitamin D level 07/31/2020   Acute combined systolic and diastolic heart failure ([place]) 05/09/2020   Hypomagnesemia 03/04/2020   Thrombocytopenia ([place]) 03/04/2020   Dysphagia 03/04/2020   Constipation 03/04/2020   Sensation of pressure in bladder area 01/17/2020   Hyperglycemia 01/17/2020   Peripheral neuropathy 11/15/2019   Chronic migraine without aura, with intractable migraine, so stated, with status migrainosus 10/17/2019   ETD (Eustachian tube dysfunction), bilateral 09/12/2019   Urinary frequency 07/17/2019   Headache 07/17/2019   Palpitation 05/15/2019   Clostridium difficile diarrhea 09/04/2018   Thyroid nodule 08/26/2018   Multiple myeloma ([place]) 07/20/2018   Multiple myeloma not having achieved remission ([place]) 07/20/2018   Hematuria 07/11/2018   Seasonal allergies 06/23/2018   Right shoulder pain 06/07/2018   Neck pain 06/07/2018   Paresthesias 06/07/2018   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   Pain in thoracic spine 03/18/2018   Thoracic back pain 02/15/2018   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   Hypokalemia 08/20/2017   Anemia 08/20/2017   Elevated sed rate 08/15/2017   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   Epigastric abdominal pain 03/22/2017   Myalgia 12/31/2016   Abnormal SPEP 07/26/2016   Low back pain 07/14/2016   Insomnia 12/29/2015   Tension headache 10/21/2015   Muscle spasms of neck 10/18/2015   Dysuria 10/13/2015   AP (abdominal pain) 07/07/2015   Cardiomyopathy ([place]) 04/19/2015   Chest pain 04/19/2015   Obstructive sleep apnea 03/31/2015   Congestive heart failure ([place]) 02/24/2015   Arthritis    Hyperlipidemia    Hypertension    Depression    Stroke [facility])    GERD (gastroesophageal reflux disease)    Bowel obstruction ([place]) 11/09/2014     Health Maintenance Due  Topic Date Due   Pneumonia Vaccine 51+ Years old (4 of 4 - PPSV23 or PCV20) 06/02/2022   COVID-19 Vaccine (6 - 2023-24 season) 11/03/2022   Zoster  Vaccines- Shingrix (2 of 2) 11/10/2022   Medicare Annual Wellness (AWV)  12/16/2022     Review of Systems 12 point ros is negative except what is mentioned above. Physical Exam   BP 120/80   Pulse 87   Temp 97.8 F (36.6 C) (Oral)   Wt 192 lb (87.1 kg)   SpO2 97%   BMI 30.99 kg/m   Physical Exam  Constitutional:  oriented to person, place, and time. appears well-developed and well-nourished. No distress.  HENT: [place]/AT, PERRLA, no scleral icterus Mouth/Throat: Oropharynx is clear and moist. No oropharyngeal exudate.  Cardiovascular: Normal rate, regular rhythm and normal heart sounds. Exam reveals no gallop and no friction rub.  No murmur heard.  Pulmonary/Chest: Effort normal and breath sounds normal. No respiratory distress.  has no wheezes.  Neck = supple, no nuchal rigidity Abdominal: Soft. Bowel sounds are normal.  exhibits no  distension. There is no tenderness.  Lymphadenopathy: no cervical adenopathy. No axillary adenopathy Neurological: alert and oriented to person, place, and time.  Skin: Skin is warm and dry. No rash noted. No erythema.  Psychiatric: a normal mood and affect.  behavior is normal.   Lab Results  Component Value Date   LABRPR NON REACTIVE 08/23/2020    CBC Lab Results  Component Value Date   WBC 2.2 (L) 01/01/2023   RBC 4.26 01/01/2023   HGB 12.1 01/01/2023   HCT 38.1 01/01/2023   PLT 132 (L) 01/01/2023   MCV 89.4 01/01/2023   MCH 28.4 01/01/2023   MCHC 31.8 01/01/2023   RDW 13.7 01/01/2023   LYMPHSABS 0.8 01/01/2023   MONOABS 0.2 01/01/2023   EOSABS 0.1 01/01/2023    BMET Lab Results  Component Value Date   NA 139 01/01/2023   K 3.8 01/01/2023   CL 104 01/01/2023   CO2 27 01/01/2023   GLUCOSE 120 (H) 01/01/2023   BUN 13 01/01/2023   CREATININE 0.92 01/01/2023   CALCIUM 9.9 01/01/2023   GFRNONAA >60 01/01/2023   GFRAA >60 08/10/2020      Assessment and Plan Diarrhea = has had intermittent course of cdifficile but 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 Will check cdiff to ensure it is negative Will prescribe bentyl for symptoms  If +, then would plan to treat for cdiff and then consider zimplava

## 2023-01-26 ENCOUNTER — Ambulatory Visit: Payer: Self-pay | Admitting: Licensed Clinical Social Worker

## 2023-01-26 NOTE — Patient Instructions (Signed)
Visit Information  Thank you for taking time to visit with me today. Please don't hesitate to contact me if I can be of assistance to you.   Following are the goals we discussed today:   Goals Addressed             This Visit's Progress    Get [place] and talk with RN Care manager       Activities and task to complete in order to accomplish goals.   We discussed [place], I will work with Dr. Randel Pigg to get this process started We also discussed your symptoms of depression, we will spend more time discussing this during our next call  I have scheduled a phone appointment with the [place] she will provide educational informational and assist you with managing your health needs . Friday March 22nd         Our next appointment is by telephone on 02/02/23 at 11:00  Please call the care guide team at [phone] if you need to cancel or reschedule your appointment.    The patient verbalized understanding of instructions, educational materials, and care plan provided today and DECLINED offer to receive copy of patient instructions, educational materials, and care plan.   Casimer Lanius, [place] [phone]

## 2023-01-26 NOTE — Patient Outreach (Signed)
  Care Coordination  Follow Up Visit Note   01/26/2023 Name: Tami [name] MRN: [id number] DOB: [birth year]  Tami [name] is a 67 y.o. year old female who sees Mosie Lukes, MD for primary care. I spoke with  [name] by phone today.  What matters to the patients health and wellness today?  Not feeling well today.   Patient still has not been able to activate her MyChart.  Will assist at next encounter. Rescheduled call for next week.   Goals Addressed             This Visit's Progress    Get Personal Care Services and talk with RN Care manager       Activities and task to complete in order to accomplish goals.   We discussed [place], I will work with Dr. Randel Pigg to get this process started We also discussed your symptoms of depression, we will spend more time discussing this during our next call  I have scheduled a phone appointment with the [place] she will provide educational informational and assist you with managing your health needs . Friday March 22nd        SDOH assessments and interventions completed:  No  Care Coordination Interventions:  Yes, provided  Interventions Today    Flowsheet Row Most Recent Value  Chronic Disease   Chronic disease during today's visit Hypertension (HTN), Congestive Heart Failure (CHF)  General Interventions   General Interventions Discussed/Reviewed General Interventions Reviewed, Level of Care, Communication with  Communication with RN  [reference PCS form]  Level of Care Personal Care Services       Follow up plan: Follow up call scheduled for 1 week    Encounter Outcome:  Pt. Visit Completed   Casimer Lanius, [place] [phone]

## 2023-01-27 ENCOUNTER — Other Ambulatory Visit (HOSPITAL_BASED_OUTPATIENT_CLINIC_OR_DEPARTMENT_OTHER): Payer: Self-pay

## 2023-01-27 ENCOUNTER — Other Ambulatory Visit: Payer: Self-pay

## 2023-01-27 ENCOUNTER — Emergency Department (HOSPITAL_BASED_OUTPATIENT_CLINIC_OR_DEPARTMENT_OTHER)
Admission: EM | Admit: 2023-01-27 | Discharge: 2023-01-27 | Disposition: A | Discharge status: Home / Self Care | Payer: 59 | Attending: Emergency Medicine | Admitting: Emergency Medicine

## 2023-01-27 ENCOUNTER — Encounter (HOSPITAL_BASED_OUTPATIENT_CLINIC_OR_DEPARTMENT_OTHER): Payer: Self-pay | Admitting: Urology

## 2023-01-27 DIAGNOSIS — R109 Unspecified abdominal pain: Secondary | ICD-10-CM | POA: Diagnosis not present

## 2023-01-27 DIAGNOSIS — Z1231 Encounter for screening mammogram for malignant neoplasm of breast: Secondary | ICD-10-CM | POA: Diagnosis not present

## 2023-01-27 DIAGNOSIS — Z8673 Personal history of transient ischemic attack (TIA), and cerebral infarction without residual deficits: Secondary | ICD-10-CM | POA: Diagnosis not present

## 2023-01-27 DIAGNOSIS — I10 Essential (primary) hypertension: Secondary | ICD-10-CM | POA: Diagnosis not present

## 2023-01-27 DIAGNOSIS — N183 Chronic kidney disease, stage 3 unspecified: Secondary | ICD-10-CM | POA: Diagnosis not present

## 2023-01-27 DIAGNOSIS — Z7982 Long term (current) use of aspirin: Secondary | ICD-10-CM | POA: Insufficient documentation

## 2023-01-27 DIAGNOSIS — I110 Hypertensive heart disease with heart failure: Secondary | ICD-10-CM | POA: Diagnosis not present

## 2023-01-27 DIAGNOSIS — N390 Urinary tract infection, site not specified: Secondary | ICD-10-CM | POA: Diagnosis not present

## 2023-01-27 DIAGNOSIS — E559 Vitamin D deficiency, unspecified: Secondary | ICD-10-CM | POA: Diagnosis not present

## 2023-01-27 DIAGNOSIS — R197 Diarrhea, unspecified: Secondary | ICD-10-CM | POA: Insufficient documentation

## 2023-01-27 DIAGNOSIS — M545 Low back pain, unspecified: Secondary | ICD-10-CM | POA: Diagnosis not present

## 2023-01-27 DIAGNOSIS — Z79899 Other long term (current) drug therapy: Secondary | ICD-10-CM | POA: Diagnosis not present

## 2023-01-27 DIAGNOSIS — I509 Heart failure, unspecified: Secondary | ICD-10-CM | POA: Insufficient documentation

## 2023-01-27 DIAGNOSIS — E785 Hyperlipidemia, unspecified: Secondary | ICD-10-CM | POA: Diagnosis not present

## 2023-01-27 DIAGNOSIS — E611 Iron deficiency: Secondary | ICD-10-CM | POA: Diagnosis not present

## 2023-01-27 LAB — COMPREHENSIVE METABOLIC PANEL
ALT: 19 U/L (ref 0–44)
AST: 29 U/L (ref 15–41)
Albumin: 3 g/dL — ABNORMAL LOW (ref 3.5–5.0)
Alkaline Phosphatase: 50 U/L (ref 38–126)
Anion gap: 7 (ref 5–15)
BUN: 17 mg/dL (ref 8–23)
CO2: 23 mmol/L (ref 22–32)
Calcium: 8.5 mg/dL — ABNORMAL LOW (ref 8.9–10.3)
Chloride: 104 mmol/L (ref 98–111)
Creatinine, Ser: 0.71 mg/dL (ref 0.44–1.00)
GFR, Estimated: >60 mL/min (ref >60)
Glucose, Bld: 143 mg/dL — ABNORMAL HIGH (ref 70–99)
Potassium: 3.5 mmol/L (ref 3.5–5.1)
Sodium: 134 mmol/L — ABNORMAL LOW (ref 135–145)
Total Bilirubin: 0.3 mg/dL (ref 0.3–1.2)
Total Protein: 8.7 g/dL — ABNORMAL HIGH (ref 6.5–8.1)

## 2023-01-27 LAB — CBC WITH DIFFERENTIAL/PLATELET
Abs Immature Granulocytes: 0 10*3/uL (ref 0.00–0.07)
Basophils Absolute: 0 10*3/uL (ref 0.0–0.1)
Basophils Relative: 1 %
Eosinophils Absolute: 0.1 10*3/uL (ref 0.0–0.5)
Eosinophils Relative: 3 %
HCT: 35.1 % — ABNORMAL LOW (ref 36.0–46.0)
Hemoglobin: 11.4 g/dL — ABNORMAL LOW (ref 12.0–15.0)
Immature Granulocytes: 0 %
Lymphocytes Relative: 42 %
Lymphs Abs: 0.8 10*3/uL (ref 0.7–4.0)
MCH: 28.9 pg (ref 26.0–34.0)
MCHC: 32.5 g/dL (ref 30.0–36.0)
MCV: 89.1 fL (ref 80.0–100.0)
Monocytes Absolute: 0.3 10*3/uL (ref 0.1–1.0)
Monocytes Relative: 13 %
Neutro Abs: 0.8 10*3/uL — ABNORMAL LOW (ref 1.7–7.7)
Neutrophils Relative %: 41 %
Platelets: 112 10*3/uL — ABNORMAL LOW (ref 150–400)
RBC: 3.94 MIL/uL (ref 3.87–5.11)
RDW: 13.7 % (ref 11.5–15.5)
WBC: 1.9 10*3/uL — ABNORMAL LOW (ref 4.0–10.5)
nRBC: 0 % (ref 0.0–0.2)

## 2023-01-27 LAB — LIPASE, BLOOD: Lipase: 36 U/L (ref 11–51)

## 2023-01-27 MED ORDER — SODIUM CHLORIDE 0.9 % IV BOLUS
1000.0000 mL | Freq: Once | INTRAVENOUS | Status: AC
  Administered 2023-01-27: 1000 mL via INTRAVENOUS

## 2023-01-27 MED ORDER — PROCHLORPERAZINE MALEATE 10 MG PO TABS
5.0000 mg | ORAL_TABLET | Freq: Once | ORAL | Status: AC
  Administered 2023-01-27: 5 mg via ORAL
  Filled 2023-01-27: qty 1

## 2023-01-27 MED ORDER — PROCHLORPERAZINE MALEATE 10 MG PO TABS
5.0000 mg | ORAL_TABLET | Freq: Two times a day (BID) | ORAL | 0 refills | Status: DC | PRN
  Filled 2023-01-27: qty 3, 3d supply, fill #0

## 2023-01-27 NOTE — ED Notes (Signed)
Discharge instructions reviewed with patient. Patient verbalizes understanding, no further questions at this time. Medications/prescriptions and follow up information provided. No acute distress noted at time of departure.  

## 2023-01-27 NOTE — ED Provider Notes (Signed)
[facility] Provider Note   CSN: [id number] Arrival date & time: 01/27/23  1219     History  Chief Complaint  Patient presents with   Diarrhea    Tami [name] is a 67 y.o. female.  Patient here with diarrhea for the last day or 2.  May be suspicious food intake.  No recent antibiotics.  No abdominal pain.  No vomiting but some nausea.  Feels dehydrated.  History of heart failure, colitis, hypertension, high cholesterol.  Denies any chest pain, abdominal pain.  The history is provided by the patient.       Home Medications Prior to Admission medications   Medication Sig Start Date End Date Taking? Authorizing Provider  prochlorperazine (COMPAZINE) 5 MG tablet Take 1 tablet (5 mg total) by mouth 2 (two) times daily as needed for up to 5 doses for nausea or vomiting. 01/27/23  Yes Terrick Allred, DO  acetaminophen (TYLENOL) 500 MG tablet Take 500 mg by mouth in the morning.    [provider]  acyclovir (ZOVIRAX) 400 MG tablet TAKE ONE (1) TABLET BY MOUTH TWO (2) TIMES DAILY 08/29/22   [name], MD  albuterol (PROVENTIL) (2.5 MG/3ML) 0.083% nebulizer solution Take 3 mLs (2.5 mg total) by nebulization every 6 (six) hours as needed for wheezing or shortness of breath. 12/15/22   Mosie Lukes, MD  albuterol (VENTOLIN HFA) 108 (90 Base) MCG/ACT inhaler Inhale 2 puffs into the lungs every 6 (six) hours as needed for wheezing or shortness of breath. 12/07/22   Mosie Lukes, MD  aspirin EC 81 MG tablet Take 1 tablet (81 mg total) by mouth daily. Patient taking differently: Take 81 mg by mouth every evening. 02/15/18   Mosie Lukes, MD  budesonide (PULMICORT) 0.5 MG/2ML nebulizer solution Take 2 mLs (0.5 mg total) by nebulization 2 (two) times daily as needed (cough, wheezing or shortness of breath). 12/31/22   Freddi Starr, MD  cetirizine (ZYRTEC) 10 MG tablet Take 10 mg by mouth at bedtime.    [provider]   Cholecalciferol (VITAMIN D3) 50 MCG (2000 UT) TABS Take 2,000 Units by mouth in the morning.    [provider]  colestipol (COLESTID) 1 g tablet Take 3-4 tablets (3-4 g total) by mouth every 12 (twelve) hours. Patient taking differently: Take 2 g by mouth every 12 (twelve) hours. 10/13/22   Mosie Lukes, MD  dicyclomine (BENTYL) 10 MG capsule Take 1 capsule (10 mg total) by mouth 4 (four) times daily -  before meals and at bedtime. 01/25/23 04/25/23  Carlyle Basques, MD  fluticasone (FLONASE) 50 MCG/ACT nasal spray Place 2 sprays into both nostrils daily. 01/07/20   Joy, Shawn C, PA-C  furosemide (LASIX) 20 MG tablet Take 20 mg by mouth daily as needed (fluid retention (feet swelling)). 07/03/20   [provider]  hyoscyamine (LEVSIN SL) 0.125 MG SL tablet Place 1 tablet (0.125 mg total) under the tongue every 4 (four) hours as needed. 05/21/22   Mosie Lukes, MD  lidocaine (LIDODERM) 5 % Place 1 patch onto the skin every 12 (twelve) hours. Remove and discard within 12 hours 01/11/23 02/10/23  Mosie Lukes, MD  loperamide (IMODIUM) 2 MG capsule Take 1 capsule (2 mg total) by mouth 4 (four) times daily as needed for diarrhea or loose stools. 07/21/22   Elgie Congo, MD  losartan (COZAAR) 50 MG tablet Take 50 mg by mouth in the  morning and at bedtime. 04/14/21   [provider]  Magnesium Glycinate 100 MG CAPS Take 400 mg by mouth at bedtime. 09/21/22   [provider]  meloxicam (MOBIC) 15 MG tablet Take 1 tablet by mouth. 12/31/22   [provider]  metoprolol succinate (TOPROL-XL) 50 MG 24 hr tablet Take 1 tablet by mouth daily. 09/21/22   [provider]  nortriptyline (PAMELOR) 10 MG capsule Take 1 capsule (10 mg total) by mouth at bedtime. 01/12/23   Mosie Lukes, MD  potassium chloride SA (KLOR-CON M) 20 MEQ tablet Take 1 tablet (20 mEq total) by mouth daily. 01/15/23   Mosie Lukes, MD  spironolactone (ALDACTONE) 25 MG tablet Take 1  tablet (25 mg total) by mouth daily. 01/06/23   Shelda Pal, DO  sucralfate (CARAFATE) 1 g tablet Take 1 tablet (1 g total) by mouth 4 (four) times daily -  with meals and at bedtime. 05/21/22   Mosie Lukes, MD  Zoster Vaccine Adjuvanted [facility]) injection Inject into the muscle. 01/25/23   Carlyle Basques, MD      Allergies    Benazepril, Ondansetron hcl, Codeine, and Morphine    Review of Systems   Review of Systems  Physical Exam Updated Vital Signs BP 131/79 (BP Location: Left Arm)   Pulse 85   Temp 97.8 F (36.6 C)   Resp 18   Ht 5\' 6"  (1.676 m)   Wt 87 kg   SpO2 98%   BMI 30.96 kg/m  Physical Exam Vitals and nursing note reviewed.  Constitutional:      General: She is not in acute distress.    Appearance: She is well-developed. She is not ill-appearing.  HENT:     Head: Normocephalic and atraumatic.     Nose: Nose normal.     Mouth/Throat:     Mouth: Mucous membranes are moist.  Eyes:     Extraocular Movements: Extraocular movements intact.     Conjunctiva/sclera: Conjunctivae normal.     Pupils: Pupils are equal, round, and reactive to light.  Cardiovascular:     Rate and Rhythm: Normal rate and regular rhythm.     Pulses: Normal pulses.     Heart sounds: Normal heart sounds. No murmur heard. Pulmonary:     Effort: Pulmonary effort is normal. No respiratory distress.     Breath sounds: Normal breath sounds.  Abdominal:     Palpations: Abdomen is soft.     Tenderness: There is no abdominal tenderness.  Musculoskeletal:        General: No swelling.     Cervical back: Normal range of motion and neck supple.  Skin:    General: Skin is warm and dry.     Capillary Refill: Capillary refill takes less than 2 seconds.  Neurological:     General: No focal deficit present.     Mental Status: She is alert.  Psychiatric:        Mood and Affect: Mood normal.     ED Results / Procedures / Treatments   Labs (all labs ordered are listed, but only  abnormal results are displayed) Labs Reviewed  CBC WITH DIFFERENTIAL/PLATELET - Abnormal; Notable for the following components:      Result Value   WBC 1.9 (*)    Hemoglobin 11.4 (*)    HCT 35.1 (*)    Platelets 112 (*)    Neutro Abs 0.8 (*)    All other components within normal limits  COMPREHENSIVE METABOLIC  PANEL - Abnormal; Notable for the following components:   Sodium 134 (*)    Glucose, Bld 143 (*)    Calcium 8.5 (*)    Total Protein 8.7 (*)    Albumin 3.0 (*)    All other components within normal limits  LIPASE, BLOOD  PATHOLOGIST SMEAR REVIEW    EKG None  Radiology No results found.  Procedures Procedures    Medications Ordered in ED Medications  sodium chloride 0.9 % bolus 1,000 mL (1,000 mLs Intravenous New Bag/Given 01/27/23 1448)  prochlorperazine (COMPAZINE) tablet 5 mg (5 mg Oral Given 01/27/23 1446)    ED Course/ Medical Decision Making/ A&P                             Medical Decision Making Amount and/or Complexity of Data Reviewed Labs: ordered.  Risk Prescription drug management.   Tami [name] is here with diarrhea and concern for dehydration.  History of heart failure, acid reflux, hypertension, stroke.  Multiple episodes of diarrhea here recently.  No vomiting.  Some nausea.  Will give Compazine.  Will give fluid bolus check basic labs.  She has no abdominal pain.  No recent antibiotics.  May be suspicious food intake.  Differential diagnosis likely viral gastroenteritis.  Seems less likely to be C. difficile or other acute process.  Will evaluate for electrolyte abnormalities.  Per my review and interpretation of labs is no significant anemia or electrolyte abnormality or kidney injury.  She is feeling better after IV fluids and IV Compazine.  Will prescribe antiemetic.  Discharged in good condition.  This chart was dictated using voice recognition software.  Despite best efforts to proofread,  errors can occur which can change the  documentation meaning.          Final Clinical Impression(s) / ED Diagnoses Final diagnoses:  Diarrhea, unspecified type    Rx / DC Orders ED Discharge Orders          Ordered    prochlorperazine (COMPAZINE) 5 MG tablet  2 times daily PRN        01/27/23 1517              Lennice Sites, DO 01/27/23 1520

## 2023-01-27 NOTE — ED Triage Notes (Signed)
Pt states diarrhea x 3 daily, chronic issue  States she "needs fluids"  Tolerating po fluids well

## 2023-01-28 ENCOUNTER — Other Ambulatory Visit: Payer: Self-pay

## 2023-01-28 ENCOUNTER — Other Ambulatory Visit: Payer: 59

## 2023-01-28 DIAGNOSIS — A0472 Enterocolitis due to Clostridium difficile, not specified as recurrent: Secondary | ICD-10-CM

## 2023-01-29 ENCOUNTER — Ambulatory Visit: Payer: Self-pay

## 2023-01-29 LAB — PATHOLOGIST SMEAR REVIEW

## 2023-01-29 NOTE — Patient Instructions (Signed)
Visit Information  Thank you for taking time to visit with me today. Please don't hesitate to contact me if I can be of assistance to you.   Following are the goals we discussed today:   Goals Addressed             This Visit's Progress    Assistance with Health Management       Interventions Today    Flowsheet Row Most Recent Value  Chronic Disease   Chronic disease during today's visit Hypertension (HTN), Congestive Heart Failure (CHF), Other  [per patient Chronic Diarrhea, multiple myeloma]  General Interventions   General Interventions Discussed/Reviewed General Interventions Discussed, Doctor Visits  Doctor Visits Discussed/Reviewed Doctor Visits Discussed  [reviewed instructions per provider visit with ID office visit on 01/25/23]  Communication with Pharmacists  Education Interventions   Education Provided Provided Education, Provided Web-based Education  [C. difficile, HTN, low salt diet]  Provided Verbal Education On Medication, When to see the doctor, Marketing executive provider with health questions/concerns. encouraged to follow up with provider appointments as scheduled]  Pharmacy Interventions   Pharmacy Dicussed/Reviewed Pharmacy Topics Discussed, Referral to Pharmacist  [medications reviewed]  Referral to Pharmacist Drug interaction/side effects  [request medication review possible interactions to exacerbate/cause diarrhea.]            Our next appointment is by telephone on 03/02/23 at 3:00 pm  Please call the care guide team at [phone] if you need to cancel or reschedule your appointment.   If you are experiencing a Mental Health or [place] or need someone to talk to, please call the Suicide and Crisis Lifeline: [place], RN, MSN, BSN, [place] [phone]

## 2023-01-29 NOTE — Patient Outreach (Signed)
  Care Coordination   Initial Visit Note   01/29/2023 Name: Tami [name] MRN: [id number] DOB: June 22, 1956  Tami [name] is a 67 y.o. year old female who sees Tami Lukes, MD for primary care. I spoke with  Tami [name] by phone today.  What matters to the patients health and wellness today?  Tami [name] expresses frustration with chronic diarrhea. Per Tami [name] she has had issues with diarrhea for the past two years. Now she is having episodes of C. Diff. Last office visit with ID 01/25/23. She states ED visit on 3/20 and received IV fluids due to dehydration.  Goals Addressed             This Visit's Progress    Assistance with Health Management       Interventions Today    Flowsheet Row Most Recent Value  Chronic Disease   Chronic disease during today's visit Hypertension (HTN), Congestive Heart Failure (CHF), Other  [per patient Chronic Diarrhea, multiple myeloma]  General Interventions   General Interventions Discussed/Reviewed General Interventions Discussed, Doctor Visits  Doctor Visits Discussed/Reviewed Doctor Visits Discussed  [reviewed instructions per provider visit with ID office visit on 01/25/23]  Communication with Pharmacists  Education Interventions   Education Provided Provided Education, Provided Web-based Education  [C. difficile, HTN, low salt diet]  Provided Verbal Education On Medication, When to see the doctor, Marketing executive provider with health questions/concerns. encouraged to follow up with provider appointments as scheduled]  Pharmacy Interventions   Pharmacy Dicussed/Reviewed Pharmacy Topics Discussed, Referral to Pharmacist  [medications reviewed]  Referral to Pharmacist Drug interaction/side effects  [request medication review possible interactions to exacerbate/cause diarrhea.]            SDOH assessments and interventions completed:  No recently completed.   Care Coordination Interventions:  Yes, provided   Follow up plan: Follow up  call scheduled for 03/02/23    Encounter Outcome:  Pt. Visit Completed   Tami Silversmith, RN, MSN, BSN, [place] Coordinator [phone]

## 2023-01-31 ENCOUNTER — Other Ambulatory Visit: Payer: Self-pay | Admitting: Family Medicine

## 2023-01-31 DIAGNOSIS — R197 Diarrhea, unspecified: Secondary | ICD-10-CM

## 2023-02-01 ENCOUNTER — Ambulatory Visit: Payer: 59 | Admitting: Family Medicine

## 2023-02-01 ENCOUNTER — Ambulatory Visit: Payer: 59 | Admitting: Hematology & Oncology

## 2023-02-01 ENCOUNTER — Inpatient Hospital Stay: Payer: 59

## 2023-02-02 ENCOUNTER — Ambulatory Visit: Payer: Self-pay | Admitting: Licensed Clinical Social Worker

## 2023-02-02 ENCOUNTER — Telehealth: Payer: Self-pay

## 2023-02-02 DIAGNOSIS — R030 Elevated blood-pressure reading, without diagnosis of hypertension: Secondary | ICD-10-CM | POA: Diagnosis not present

## 2023-02-02 DIAGNOSIS — R109 Unspecified abdominal pain: Secondary | ICD-10-CM | POA: Diagnosis not present

## 2023-02-02 DIAGNOSIS — R300 Dysuria: Secondary | ICD-10-CM | POA: Diagnosis not present

## 2023-02-02 DIAGNOSIS — R110 Nausea: Secondary | ICD-10-CM | POA: Diagnosis not present

## 2023-02-02 NOTE — Patient Instructions (Signed)
Visit Information  Thank you for taking time to visit with me today. Please don't hesitate to contact me if I can be of assistance to you.   Following are the goals we discussed today:   Goals Addressed             This Visit's Progress    [place]       Activities and task to complete in order to accomplish goals.   We discussed [place], All information has been submitted and confirmed.  Next step is for you to call [phone]  PCS option to schedule your assessment.  We also discussed your symptoms of depression, we will spend more time discussing this during our next call  We can also work on getting your MyChart set up.          Our next appointment is by telephone on 02/04/23 at 8:30  Please call the care guide team at [phone] if you need to cancel or reschedule your appointment.   If you are experiencing a Mental Health or [place] or need someone to talk to, please call the Suicide and Crisis Lifeline: 988 call the [place] National Suicide Prevention Lifeline: 425-709-4192 or TTY: 502-655-1442 TTY 864-276-7020) to talk to a trained counselor call 1-800-273-TALK (toll free, 24 hour hotline)   The patient verbalized understanding of instructions, educational materials, and care plan provided today and DECLINED offer to receive copy of patient instructions, educational materials, and care plan.   Casimer Lanius, [place] [phone]

## 2023-02-02 NOTE — Progress Notes (Signed)
   Care Guide Note  02/02/2023 Name: Tami [name] MRN: [id number] DOB: [birth year]  Referred by: Mosie Lukes, MD Reason for referral : Care Coordination (Outreach to schedule with Pharm d )   Tami [name] is a 67 y.o. year old female who is a primary care patient of Mosie Lukes, MD. [name] was referred to the pharmacist for assistance related to  diarrhea .    An unsuccessful telephone outreach was attempted today to contact the patient who was referred to the pharmacy team for assistance with medication management. Additional attempts will be made to contact the patient.   Noreene Larsson, [place], [place] [address] Direct Dial: [phone] [email or web address]

## 2023-02-02 NOTE — Patient Outreach (Signed)
  Care Coordination  Follow Up Visit Note   02/02/2023 Name: Tami [name] MRN: [id number] DOB: 1956/08/25  Tami [name] is a 66 y.o. year old female who sees Mosie Lukes, MD for primary care. I spoke with  [name] by phone today.  What matters to the patients health and wellness today?  Patient was unable to completed phone appointment today.  LCSW spent time collaborating with care team to meet patient's needs    Goals Addressed             This Visit's Progress    [place]       Activities and task to complete in order to accomplish goals.   We discussed [place], All information has been submitted and confirmed.  Next step is for you to call [phone]  PCS option to schedule your assessment.  We also discussed your symptoms of depression, we will spend more time discussing this during our next call  We can also work on getting your MyChart set up.         SDOH assessments and interventions completed:  No  Care Coordination Interventions:  Yes, provided  Interventions Today    Flowsheet Row Most Recent Value  Chronic Disease   Chronic disease during today's visit Hypertension (HTN), Congestive Heart Failure (CHF)  General Interventions   General Interventions Discussed/Reviewed General Interventions Reviewed, Level of Care, Communication with  Communication with PCP/Specialists  [collaborated with CMA and NCLIFT for PCS Referral]  Level of Care Personal Care Services  [confirmed with CMA referral sent,  confirmed with NCLift referral recieved.]  Applications Personal Care Services  Education Interventions   Applications Personal Care Services       Follow up plan: Follow up call scheduled for 02/04/23    Encounter Outcome:  Pt. Visit Completed   Casimer Lanius, [place] [phone]

## 2023-02-03 ENCOUNTER — Telehealth: Payer: Self-pay | Admitting: Internal Medicine

## 2023-02-03 ENCOUNTER — Telehealth: Payer: Self-pay | Admitting: Pharmacist

## 2023-02-03 ENCOUNTER — Other Ambulatory Visit: Payer: Self-pay | Admitting: Internal Medicine

## 2023-02-03 ENCOUNTER — Telehealth: Payer: Self-pay

## 2023-02-03 ENCOUNTER — Other Ambulatory Visit: Payer: Self-pay

## 2023-02-03 ENCOUNTER — Other Ambulatory Visit (HOSPITAL_COMMUNITY): Payer: Self-pay

## 2023-02-03 DIAGNOSIS — A0472 Enterocolitis due to Clostridium difficile, not specified as recurrent: Secondary | ICD-10-CM

## 2023-02-03 DIAGNOSIS — I480 Paroxysmal atrial fibrillation: Secondary | ICD-10-CM | POA: Diagnosis not present

## 2023-02-03 DIAGNOSIS — Z4502 Encounter for adjustment and management of automatic implantable cardiac defibrillator: Secondary | ICD-10-CM | POA: Diagnosis not present

## 2023-02-03 DIAGNOSIS — H35713 Central serous chorioretinopathy, bilateral: Secondary | ICD-10-CM | POA: Diagnosis not present

## 2023-02-03 MED ORDER — VANCOMYCIN HCL 125 MG PO CAPS: ORAL_CAPSULE | ORAL | 0 refills | Status: DC

## 2023-02-03 NOTE — Telephone Encounter (Signed)
Per Dr. Baxter Flattery called patient to inform her cultures cam back positive. Would like for pt to start can oral vancomycin 125mg  QID x 10 days with taper of TID x 7 d, BID x 7 days, daily x 7 days. Called and spoke with patient who would like to speak with Dr. Baxter Flattery before office send in prescription.  [facility].  Message sent to patient requesting call back. Leatrice Jewels, RMA

## 2023-02-03 NOTE — Telephone Encounter (Signed)
Dr. Baxter Flattery reached out to me to see if patient could receive Zinplava IV infusion to prevent recurrences of her c diff infection. Checked with Ok Edwards, CPhT to see about PA. She states no PA is needed. Patient has met her deductible but still has ~$7000 of her out of pocket max before everything is covered. She will be responsible for 20% of her Zinplava bill.  Called patient to discuss. Explained the above and that she may have a pretty high bill from the infusion and that I could not tell her the exact price as I wasn't sure how much nursing administration, the IV administration fee, IV equipment, etc would cost associated with receiving the infusion. I did suggest for her to reach out to her insurance company to see if she could get a better idea of the cost, but she wanted to proceed with getting set up for the injection. Will write orders for short stay and fax over. [name], [facility] CMA will coordinate getting patient scheduled.   Patient asked me to reach out to her legal guardian, [name], to discuss this. Called and spoke to Delta, who was agitated over the course of treatment for [name]. I explained that Dr. Baxter Flattery wanted her to start oral vancomycin again but in a taper form this time and that we would proceed with getting her scheduled for the infusion. She was upset that she was going to get vancomycin again and stated that it was not the right treatment recommendation. I explained that it was recommended and the infusion is not to treat but to prevent. I answered several other questions for her as well. Asked her to reach back out to [place] if she had any futher questions.   Joshva Labreck L. Ilyana Manuele, PharmD, BCIDP, AAHIVP, CPP Clinical Pharmacist Practitioner [facility] 02/03/2023, 3:24 PM

## 2023-02-03 NOTE — Telephone Encounter (Signed)
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 Spoke to patient with results and instructions for plans  Also have pharmacy check with zinplava coverage.  Elzie Rings [place] [facility] [phone]

## 2023-02-03 NOTE — Telephone Encounter (Signed)
Order has been faxed to short stay for the patient. I have also LVM for Short stay to call back with an appointment for the patient. Tami [name]

## 2023-02-04 ENCOUNTER — Other Ambulatory Visit (HOSPITAL_COMMUNITY): Payer: Self-pay | Admitting: *Deleted

## 2023-02-04 ENCOUNTER — Ambulatory Visit: Payer: Self-pay | Admitting: Licensed Clinical Social Worker

## 2023-02-04 LAB — C.DIFF TOXINB QL PCR: CLOSTRIDIUM DIFFICILE TOXINB,QL REAL TIME PCR: DETECTED — CR

## 2023-02-04 LAB — CLOSTRIDIUM DIFFICILE CULTURE-FECAL

## 2023-02-04 NOTE — Telephone Encounter (Signed)
Patient scheduled for 4/9 at 8 AM, appointment info and location relayed to patient. Order re-faxed to [facility] with insurance info per Short Stay request.   Beryle Flock, RN

## 2023-02-04 NOTE — Telephone Encounter (Signed)
Thanks Jinny Blossom!  FYI Dr, Baxter Flattery.

## 2023-02-04 NOTE — Patient Instructions (Signed)
Visit Information  Thank you for taking time to visit with me today. Please don't hesitate to contact me if I can be of assistance to you.   Following are the goals we discussed today:   Goals Addressed             This Visit's Progress    [place]       Activities and task to complete in order to accomplish goals.   We discussed [place], All information has been submitted and confirmed.  Please call [phone]  PCS option to schedule your assessment.  We also discussed your symptoms of depression, your would like to focus on during our next call  We can also work on getting your MyChart set up.          Our next appointment is by telephone on 02/15/23   Please call the care guide team at [phone] if you need to cancel or reschedule your appointment.   If you are experiencing a Mental Health or [place] or need someone to talk to, please call the Suicide and Crisis Lifeline: 988 call the [place] National Suicide Prevention Lifeline: 534-830-1781 or TTY: 567-467-3971 TTY 337-670-3517) to talk to a trained counselor call 1-800-273-TALK (toll free, 24 hour hotline) go to [facility] [phone])   The patient verbalized understanding of instructions, educational materials, and care plan provided today and DECLINED offer to receive copy of patient instructions, educational materials, and care plan.   Casimer Lanius, [place] [phone]

## 2023-02-04 NOTE — Patient Outreach (Signed)
  Care Coordination  Follow Up Visit Note   02/04/2023 Name: Tami [name] MRN: [id number] DOB: 14-Dec-1955  Tami [name] is a 67 y.o. year old female who sees Mosie Lukes, MD for primary care. I spoke with  [name] by phone today.  What matters to the patients health and wellness today?  Getting personal care services  Patient is making progress towards getting personal care services. She has not been able to get her MyChart set up. See activities below for task to accomplish goal   Goals Addressed             This Visit's Progress    [place]       Activities and task to complete in order to accomplish goals.   We discussed [place], All information has been submitted and confirmed.  Please call [phone]  PCS option to schedule your assessment.  We also discussed your symptoms of depression, your would like to focus on during our next call  We can also work on getting your MyChart set up.         SDOH assessments and interventions completed:  No  Care Coordination Interventions:  Yes, provided  Interventions Today    Flowsheet Row Most Recent Value  Chronic Disease   Chronic disease during today's visit Hypertension (HTN), Congestive Heart Failure (CHF)  General Interventions   General Interventions Discussed/Reviewed General Interventions Reviewed, Level of Care  [discussed setting up [place]  Doctor Visits Discussed/Reviewed Annual Wellness Visits, Doctor Visits Reviewed  [explained what patient needs to do and what AWV is : review upcoming appointments]  Level of [place]  [reviewed process & provided phone number to call and [place] Discussed  [patient would like to focus on this during our next encounter]       Follow up plan: Follow up call scheduled for 02/15/23    Encounter Outcome:  Pt. Visit Completed    Casimer Lanius, [place] [phone]

## 2023-02-08 DIAGNOSIS — J449 Chronic obstructive pulmonary disease, unspecified: Secondary | ICD-10-CM | POA: Diagnosis not present

## 2023-02-08 DIAGNOSIS — I10 Essential (primary) hypertension: Secondary | ICD-10-CM | POA: Diagnosis not present

## 2023-02-08 DIAGNOSIS — I428 Other cardiomyopathies: Secondary | ICD-10-CM | POA: Diagnosis not present

## 2023-02-08 DIAGNOSIS — Z95810 Presence of automatic (implantable) cardiac defibrillator: Secondary | ICD-10-CM | POA: Diagnosis not present

## 2023-02-09 ENCOUNTER — Ambulatory Visit: Payer: 59 | Admitting: Family Medicine

## 2023-02-10 ENCOUNTER — Encounter (HOSPITAL_BASED_OUTPATIENT_CLINIC_OR_DEPARTMENT_OTHER): Payer: Self-pay

## 2023-02-10 ENCOUNTER — Emergency Department (HOSPITAL_BASED_OUTPATIENT_CLINIC_OR_DEPARTMENT_OTHER)
Admission: EM | Admit: 2023-02-10 | Discharge: 2023-02-10 | Disposition: A | Discharge status: Home / Self Care | Payer: 59 | Attending: Emergency Medicine | Admitting: Emergency Medicine

## 2023-02-10 ENCOUNTER — Other Ambulatory Visit: Payer: Self-pay

## 2023-02-10 DIAGNOSIS — I509 Heart failure, unspecified: Secondary | ICD-10-CM | POA: Insufficient documentation

## 2023-02-10 DIAGNOSIS — R197 Diarrhea, unspecified: Secondary | ICD-10-CM | POA: Diagnosis not present

## 2023-02-10 DIAGNOSIS — Z8579 Personal history of other malignant neoplasms of lymphoid, hematopoietic and related tissues: Secondary | ICD-10-CM | POA: Diagnosis not present

## 2023-02-10 DIAGNOSIS — K529 Noninfective gastroenteritis and colitis, unspecified: Secondary | ICD-10-CM | POA: Insufficient documentation

## 2023-02-10 DIAGNOSIS — Z7982 Long term (current) use of aspirin: Secondary | ICD-10-CM | POA: Diagnosis not present

## 2023-02-10 DIAGNOSIS — R8271 Bacteriuria: Secondary | ICD-10-CM | POA: Diagnosis not present

## 2023-02-10 DIAGNOSIS — B3731 Acute candidiasis of vulva and vagina: Secondary | ICD-10-CM | POA: Insufficient documentation

## 2023-02-10 LAB — CBC WITH DIFFERENTIAL/PLATELET
Abs Immature Granulocytes: 0.01 10*3/uL (ref 0.00–0.07)
Basophils Absolute: 0 10*3/uL (ref 0.0–0.1)
Basophils Relative: 1 %
Eosinophils Absolute: 0.1 10*3/uL (ref 0.0–0.5)
Eosinophils Relative: 2 %
HCT: 39.7 % (ref 36.0–46.0)
Hemoglobin: 12.5 g/dL (ref 12.0–15.0)
Immature Granulocytes: 0 %
Lymphocytes Relative: 44 %
Lymphs Abs: 1.2 10*3/uL (ref 0.7–4.0)
MCH: 28.5 pg (ref 26.0–34.0)
MCHC: 31.5 g/dL (ref 30.0–36.0)
MCV: 90.6 fL (ref 80.0–100.0)
Monocytes Absolute: 0.3 10*3/uL (ref 0.1–1.0)
Monocytes Relative: 11 %
Neutro Abs: 1.2 10*3/uL — ABNORMAL LOW (ref 1.7–7.7)
Neutrophils Relative %: 42 %
Platelets: 139 10*3/uL — ABNORMAL LOW (ref 150–400)
RBC: 4.38 MIL/uL (ref 3.87–5.11)
RDW: 13.4 % (ref 11.5–15.5)
WBC: 2.8 10*3/uL — ABNORMAL LOW (ref 4.0–10.5)
nRBC: 0 % (ref 0.0–0.2)

## 2023-02-10 LAB — COMPREHENSIVE METABOLIC PANEL
ALT: 17 U/L (ref 0–44)
AST: 28 U/L (ref 15–41)
Albumin: 3.4 g/dL — ABNORMAL LOW (ref 3.5–5.0)
Alkaline Phosphatase: 49 U/L (ref 38–126)
Anion gap: 7 (ref 5–15)
BUN: 19 mg/dL (ref 8–23)
CO2: 22 mmol/L (ref 22–32)
Calcium: 8.9 mg/dL (ref 8.9–10.3)
Chloride: 108 mmol/L (ref 98–111)
Creatinine, Ser: 0.79 mg/dL (ref 0.44–1.00)
GFR, Estimated: >60 mL/min (ref >60)
Glucose, Bld: 140 mg/dL — ABNORMAL HIGH (ref 70–99)
Potassium: 3.9 mmol/L (ref 3.5–5.1)
Sodium: 137 mmol/L (ref 135–145)
Total Bilirubin: 0.4 mg/dL (ref 0.3–1.2)
Total Protein: 9.3 g/dL — ABNORMAL HIGH (ref 6.5–8.1)

## 2023-02-10 LAB — URINALYSIS, ROUTINE W REFLEX MICROSCOPIC
Bilirubin Urine: NEGATIVE
Glucose, UA: NEGATIVE mg/dL
Ketones, ur: NEGATIVE mg/dL
Nitrite: NEGATIVE
Protein, ur: NEGATIVE mg/dL
Specific Gravity, Urine: 1.025 (ref 1.005–1.030)
pH: 5.5 (ref 5.0–8.0)

## 2023-02-10 LAB — WET PREP, GENITAL
Clue Cells Wet Prep HPF POC: NONE SEEN
Sperm: NONE SEEN
Trich, Wet Prep: NONE SEEN
WBC, Wet Prep HPF POC: <10 (ref <10)

## 2023-02-10 LAB — LIPASE, BLOOD: Lipase: 35 U/L (ref 11–51)

## 2023-02-10 LAB — URINALYSIS, MICROSCOPIC (REFLEX)

## 2023-02-10 MED ORDER — LACTATED RINGERS IV BOLUS
500.0000 mL | Freq: Once | INTRAVENOUS | Status: AC
  Administered 2023-02-10: 500 mL via INTRAVENOUS

## 2023-02-10 MED ORDER — FLUCONAZOLE 150 MG PO TABS: 150.0000 mg | ORAL_TABLET | Freq: Once | ORAL | 0 refills | Status: AC

## 2023-02-10 MED ORDER — LOPERAMIDE HCL 2 MG PO CAPS
2.0000 mg | ORAL_CAPSULE | Freq: Once | ORAL | Status: AC
  Administered 2023-02-10: 2 mg via ORAL
  Filled 2023-02-10: qty 1

## 2023-02-10 MED ORDER — FLUCONAZOLE 150 MG PO TABS
150.0000 mg | ORAL_TABLET | Freq: Once | ORAL | Status: AC
  Administered 2023-02-10: 150 mg via ORAL
  Filled 2023-02-10: qty 1

## 2023-02-10 NOTE — ED Provider Notes (Signed)
[facility] Provider Note   CSN: [id number] Arrival date & time: 02/10/23  1331     History  Chief Complaint  Patient presents with   Diarrhea    Tami [name] is a 67 y.o. female.  With PMH of CHF, GERD, multiple myeloma in remission, chronic diarrhea who presents with chronic diarrhea.  Patient has been dealing with chronic diarrhea for over 2 years.  She has had no recent travel outside the country or recent antibiotics.  She has 3-4 loose stools daily that are nonbloody and not consistent with melena.  She has had no nausea or vomiting.  She will have intermittent abdominal cramping pain but no active pain currently.  She is being worked up for this outpatient and supposed to see a specialist next week for infectious disease.  She is taking Imodium daily.  She has no pain with urination or hematuria but endorses some itching and burning of the vaginal region.  She has history of heart failure but denies any increase swelling of the extremities or weight gain.  No shortness of breath.  She was trying to get fluids from her PCP but they told her to go to the ER.   Diarrhea      Home Medications Prior to Admission medications   Medication Sig Start Date End Date Taking? Authorizing Provider  fluconazole (DIFLUCAN) 150 MG tablet Take 1 tablet (150 mg total) by mouth once for 1 dose. 02/13/23 02/13/23 Yes Elgie Congo, MD  acetaminophen (TYLENOL) 500 MG tablet Take 500 mg by mouth in the morning.    [provider]  acyclovir (ZOVIRAX) 400 MG tablet TAKE ONE (1) TABLET BY MOUTH TWO (2) TIMES DAILY 08/29/22   [name], MD  albuterol (PROVENTIL) (2.5 MG/3ML) 0.083% nebulizer solution Take 3 mLs (2.5 mg total) by nebulization every 6 (six) hours as needed for wheezing or shortness of breath. 12/15/22   Mosie Lukes, MD  albuterol (VENTOLIN HFA) 108 (90 Base) MCG/ACT inhaler Inhale 2 puffs into the lungs every 6 (six) hours  as needed for wheezing or shortness of breath. 12/07/22   Mosie Lukes, MD  aspirin EC 81 MG tablet Take 1 tablet (81 mg total) by mouth daily. Patient taking differently: Take 81 mg by mouth every evening. 02/15/18   Mosie Lukes, MD  budesonide (PULMICORT) 0.5 MG/2ML nebulizer solution Take 2 mLs (0.5 mg total) by nebulization 2 (two) times daily as needed (cough, wheezing or shortness of breath). 12/31/22   Freddi Starr, MD  cetirizine (ZYRTEC) 10 MG tablet Take 10 mg by mouth at bedtime.    [provider]  Cholecalciferol (VITAMIN D3) 50 MCG (2000 UT) TABS Take 2,000 Units by mouth in the morning.    [provider]  colestipol (COLESTID) 1 g tablet Take 3-4 tablets (3-4 g total) by mouth every 12 (twelve) hours. Patient taking differently: Take 2 g by mouth every 12 (twelve) hours. 10/13/22   Mosie Lukes, MD  dicyclomine (BENTYL) 10 MG capsule Take 1 capsule (10 mg total) by mouth 4 (four) times daily -  before meals and at bedtime. 01/25/23 04/25/23  Carlyle Basques, MD  fluticasone (FLONASE) 50 MCG/ACT nasal spray Place 2 sprays into both nostrils daily. 01/07/20   Joy, Shawn C, PA-C  furosemide (LASIX) 20 MG tablet Take 20 mg by mouth daily as needed (fluid retention (feet swelling)). 07/03/20   [provider]  hyoscyamine (LEVSIN SL)  0.125 MG SL tablet Place 1 tablet (0.125 mg total) under the tongue every 4 (four) hours as needed. 05/21/22   Mosie Lukes, MD  lidocaine (LIDODERM) 5 % Place 1 patch onto the skin every 12 (twelve) hours. Remove and discard within 12 hours 01/11/23 02/10/23  Mosie Lukes, MD  loperamide (IMODIUM) 2 MG capsule Take 1 capsule (2 mg total) by mouth 4 (four) times daily as needed for diarrhea or loose stools. 07/21/22   Elgie Congo, MD  losartan (COZAAR) 50 MG tablet Take 50 mg by mouth in the morning and at bedtime. 04/14/21   [provider]  Magnesium Glycinate 100 MG CAPS Take 400 mg by mouth at bedtime.  09/21/22   [provider]  meloxicam (MOBIC) 15 MG tablet Take 1 tablet by mouth. 12/31/22   [provider]  metoprolol succinate (TOPROL-XL) 50 MG 24 hr tablet Take 1 tablet by mouth daily. 09/21/22   [provider]  nortriptyline (PAMELOR) 10 MG capsule Take 1 capsule (10 mg total) by mouth at bedtime. 01/12/23   Mosie Lukes, MD  potassium chloride SA (KLOR-CON M) 20 MEQ tablet Take 1 tablet (20 mEq total) by mouth daily. 01/15/23   Mosie Lukes, MD  prochlorperazine (COMPAZINE) 10 MG tablet Take 0.5 tablets (5 mg total) by mouth 2 (two) times daily as needed for up to 5 doses for nausea or vomiting. 01/27/23   Curatolo, Adam, DO  spironolactone (ALDACTONE) 25 MG tablet Take 1 tablet (25 mg total) by mouth daily. 01/06/23   Shelda Pal, DO  sucralfate (CARAFATE) 1 g tablet Take 1 tablet (1 g total) by mouth 4 (four) times daily -  with meals and at bedtime. 05/21/22   Mosie Lukes, MD  vancomycin (VANCOCIN) 125 MG capsule Take 1 capsule (125 mg total) by mouth 4 (four) times daily for 10 days, THEN 1 capsule (125 mg total) in the morning, at noon, and at bedtime for 7 days, THEN 1 capsule (125 mg total) in the morning and at bedtime for 7 days, THEN 1 capsule (125 mg total) daily for 7 days. 02/03/23 03/06/23  Carlyle Basques, MD  Zoster Vaccine Adjuvanted [facility]) injection Inject into the muscle. 01/25/23   Carlyle Basques, MD      Allergies    Benazepril, Ondansetron hcl, Codeine, and Morphine    Review of Systems   Review of Systems  Gastrointestinal:  Positive for diarrhea.    Physical Exam Updated Vital Signs BP (!) 165/98 (BP Location: Right Arm)   Pulse 83   Temp 97.8 F (36.6 C) (Oral)   Resp 18   Ht 5\' 6"  (1.676 m)   Wt 87.1 kg   SpO2 94%   BMI 30.99 kg/m  Physical Exam Constitutional: Alert and oriented. Well appearing and in no distress. Eyes: Conjunctivae are normal. ENT      Mouth/Throat: Mucous membranes are  moist. Cardiovascular: S1, S2,  Normal and symmetric distal pulses are present in all extremities.Warm and well perfused. Respiratory: Normal respiratory effort. O2 sat 95 on RA Gastrointestinal: Soft and nontender. There is no CVA tenderness. No rebound, no guarding, not peritonitic. Musculoskeletal: Normal range of motion in all extremities. Trace nontender nonerythematous pitting edema of the shins equal in nature Neurologic: Normal speech and language. No gross focal neurologic deficits are appreciated. Skin: Skin is warm, dry and intact. No rash noted. Psychiatric: Mood and affect are normal. Speech and behavior are normal.  ED Results / Procedures /  Treatments   Labs (all labs ordered are listed, but only abnormal results are displayed) Labs Reviewed  WET PREP, GENITAL - Abnormal; Notable for the following components:      Result Value   Yeast Wet Prep HPF POC PRESENT (*)    All other components within normal limits  COMPREHENSIVE METABOLIC PANEL - Abnormal; Notable for the following components:   Glucose, Bld 140 (*)    Total Protein 9.3 (*)    Albumin 3.4 (*)    All other components within normal limits  URINALYSIS, ROUTINE W REFLEX MICROSCOPIC - Abnormal; Notable for the following components:   Hgb urine dipstick TRACE (*)    Leukocytes,Ua SMALL (*)    All other components within normal limits  URINALYSIS, MICROSCOPIC (REFLEX) - Abnormal; Notable for the following components:   Bacteria, UA MANY (*)    All other components within normal limits  CBC WITH DIFFERENTIAL/PLATELET - Abnormal; Notable for the following components:   WBC 2.8 (*)    Platelets 139 (*)    Neutro Abs 1.2 (*)    All other components within normal limits  GASTROINTESTINAL PANEL BY PCR, STOOL (REPLACES STOOL CULTURE)  C DIFFICILE QUICK SCREEN W PCR REFLEX    URINE CULTURE  LIPASE, BLOOD    EKG None  Radiology No results found.  Procedures Procedures    Medications Ordered in  ED Medications  loperamide (IMODIUM) capsule 2 mg (2 mg Oral Given 02/10/23 1642)  lactated ringers bolus 500 mL (500 mLs Intravenous New Bag/Given 02/10/23 1700)  fluconazole (DIFLUCAN) tablet 150 mg (150 mg Oral Given 02/10/23 1752)    ED Course/ Medical Decision Making/ A&P Clinical Course as of 02/10/23 1850  Wed Feb 10, 2023  1847 Patient reassessed she has been unable to provide stool sample.  She feels significantly improved she would like to go home follow-up with PCP.  Provided another prescription for Diflucan if still having symptoms 72 hours from today.  She will continue taking her home medicines for diarrhea.  She was discharged in good condition with return precautions discussed. [VB]    Clinical Course User Index [VB] Elgie Congo, MD    Medical Decision Making  KEVYN MINELLI is a 67 y.o. female.  With PMH of CHF, GERD, multiple myeloma in remission, chronic diarrhea who presents with chronic diarrhea.   Patient has been dealing with chronic nonbloody diarrhea for many years being worked up outpatient. Could be functional or malabsorption. She is nontender on exam has had no recent travel or antibiotics.  Doubt C. difficile or infectious colitis.  She is afebrile here hemodynamically stable.  She has chronic neutropenia white blood cell count 2.8 and chronic thrombocytopenia platelet count 139.  Her creatinine 0.79 within normal limits and she has no acute electrolyte abnormalities normal sodium 137 potassium 3.9.  No abdominal tenderness on exam, do not think she warrants any abdominal imaging today for a chronic issue.  UA with trace hemoglobin small leukocyte esterase many bacteria 6-10 WBCs however 6-10 squames also present.  She does not have any significant urinary issues we will add on for urine culture and hold off from treatment at this time.  Yeast present on wet prep given a dose of Diflucan here and another dose from 72 hours from today for repeat if still  having symptoms.  Since patient is hemodynamically stable tolerating p.o. here with no acute abnormalities on labs and chronic issue, will give 500 cc of IV fluids due to history of  CHF although no evidence of fluid overload on exam and have her continue outpatient medications and outpatient follow-up for chronic issue.  Return precaution discussed.  She is in agreement with plan and discharged in good condition.    Amount and/or Complexity of Data Reviewed Labs: ordered.  Risk Prescription drug management.    Final Clinical Impression(s) / ED Diagnoses Final diagnoses:  Chronic diarrhea  Candida vaginitis    Rx / DC Orders ED Discharge Orders          Ordered    fluconazole (DIFLUCAN) 150 MG tablet   Once        02/10/23 1848              Elgie Congo, MD 02/10/23 1850

## 2023-02-10 NOTE — ED Triage Notes (Signed)
Pt reports diarrhea started again last week and is weak. Needs fluids due to 3-4 loose stools per day. Upper abdominal pain Reports possible UTI "feels itchy"

## 2023-02-10 NOTE — Discharge Instructions (Addendum)
You have been seen in the Emergency Department (ED)  today for diarrhea.  Your work up today has not shown a clear cause for your symptoms, but overall have been reassuring.  Drink plenty of oral fluids.  Take your Imodium and diarrhea medicines at home as prescribed.  You do have a yeast infection.  Take the next dose of Diflucan on the sixth of this month if you are still having burning itching and discharge.  Follow up with your doctor as soon as possible, ideally within one week, regarding today's emergent visit and your symptoms of nausea/vomiting.   Return to the Emergency Department (ED)  if you develop severe abdominal pain, bloody vomiting, bloody diarrhea, if you are unable to tolerate fluids due to vomiting, or if you develop other symptoms that concern you.

## 2023-02-12 ENCOUNTER — Telehealth: Payer: Self-pay | Admitting: Family Medicine

## 2023-02-12 DIAGNOSIS — N3090 Cystitis, unspecified without hematuria: Secondary | ICD-10-CM | POA: Diagnosis not present

## 2023-02-12 DIAGNOSIS — R079 Chest pain, unspecified: Secondary | ICD-10-CM | POA: Diagnosis not present

## 2023-02-12 DIAGNOSIS — E860 Dehydration: Secondary | ICD-10-CM | POA: Diagnosis not present

## 2023-02-12 LAB — URINE CULTURE: Culture: >=100000 — AB

## 2023-02-12 NOTE — Telephone Encounter (Signed)
Contacted Tami [name] to schedule their annual wellness visit. Call back at later date: 03/01/23. She was at the ER  Verlee Rossetti; Care Guide Ambulatory Clinical Support [place] [facility] Direct Dial: [phone]

## 2023-02-13 ENCOUNTER — Telehealth (HOSPITAL_BASED_OUTPATIENT_CLINIC_OR_DEPARTMENT_OTHER): Payer: Self-pay | Admitting: *Deleted

## 2023-02-13 NOTE — Progress Notes (Signed)
ED Antimicrobial Stewardship Positive Culture Follow Up   Tami [name] is an 66 y.o. female who presented to [facility] on 02/10/2023 with a chief complaint of diarrhea and abdominal pain. 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 She has an appointment with the [facility] on 4/9. Will hold off on antibiotic treatment for UTI as patient does not report urinary symptoms at this time and her abdominal pain is likely attributable to C diff infection.   The patient reported vaginal itching as well and her wet-prep revealed yeast. The patient was appropriately prescribed a one-time dose of fluconazole.   Chief Complaint  Patient presents with   Diarrhea    Recent Results (from the past 720 hour(s))  Clostridium difficile culture-fecal     Status: None   Collection Time: 01/28/23  1:54 AM   Specimen: Stool  Result Value Ref Range Status   Result-CDIFCU see note  Final    Comment: CDIFF CULT W/RFLX TO TOX C. DIFFICILE CULTURE SOURCE : STOOL . For additional information, please refer to http://education.[facility].com/faq/FAQ136 (This link is being provided for informational/ educational purposes only.) .     ORGANISM(S) ISOLATED ------------------------------------------------------------ 1.  Clostridium difficile ------------------------------------------------------------ C DIFF TOX B QL PCR C DIFF TOX B QL PCR             has been added   Urine Culture (for pregnant, neutropenic or urologic patients or patients with an indwelling urinary catheter)     Status: Abnormal   Collection Time: 02/10/23  1:57 PM   Specimen: Urine, Clean Catch  Result Value Ref Range Status   Specimen Description   Final    URINE, CLEAN CATCH Performed at [facility], [address] [place], [place] [address]    Special Requests   Final    NONE Performed at [facility], [address] [place], [place] [address]    Culture (A)  Final     >=100,000 COLONIES/mL ESCHERICHIA COLI Confirmed Extended Spectrum Beta-Lactamase Producer (ESBL).  In bloodstream infections from ESBL organisms, carbapenems are preferred over piperacillin/tazobactam. They are shown to have a lower risk of mortality.    Report Status 02/12/2023 FINAL  Final   Organism ID, Bacteria ESCHERICHIA COLI (A)  Final      Susceptibility   Escherichia coli - MIC*    AMPICILLIN >=32 RESISTANT Resistant     CEFAZOLIN >=64 RESISTANT Resistant     CEFEPIME 8 INTERMEDIATE Intermediate     CEFTRIAXONE >=64 RESISTANT Resistant     CIPROFLOXACIN >=4 RESISTANT Resistant     GENTAMICIN <=1 SENSITIVE Sensitive     IMIPENEM <=0.25 SENSITIVE Sensitive     NITROFURANTOIN <=16 SENSITIVE Sensitive     TRIMETH/SULFA >=320 RESISTANT Resistant     AMPICILLIN/SULBACTAM 4 SENSITIVE Sensitive     PIP/TAZO <=4 SENSITIVE Sensitive     * >=100,000 COLONIES/mL ESCHERICHIA COLI  Wet prep, genital     Status: Abnormal   Collection Time: 02/10/23  5:13 PM   Specimen: Vaginal  Result Value Ref Range Status   Yeast Wet Prep HPF POC PRESENT (A) NONE SEEN Final   Trich, Wet Prep NONE SEEN NONE SEEN Final   Clue Cells Wet Prep HPF POC NONE SEEN NONE SEEN Final   WBC, Wet Prep HPF POC <10 <10 Final   Sperm NONE SEEN  Final    Comment: Performed at [facility], [address] [place], [place]  [address]    Plan: Hold UTI treatment for now. 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   ED Provider: Delice Bison, PA-C    Cherylin Mylar, PharmD PGY1 Pharmacy Resident 4/6/202411:48 AM  Clinical Pharmacist Monday - Friday phone -  [phone] Saturday - Sunday phone - [phone]

## 2023-02-13 NOTE — Telephone Encounter (Signed)
Post ED Visit - Positive Culture Follow-up  Culture report reviewed by antimicrobial stewardship pharmacist: [facility] []  Enzo Bi, Pharm.D. []  Celedonio Miyamoto, Pharm.D., BCPS AQ-ID []  Garvin Fila, Pharm.D., BCPS []  Georgina Pillion, Pharm.D., BCPS []  [place], [address].D., BCPS, AAHIVP []  Estella Husk, Pharm.D., BCPS, AAHIVP []  Lysle Pearl, PharmD, BCPS []  Phillips Climes, PharmD, BCPS []  Agapito Games, PharmD, BCPS []  Verlan Friends, PharmD []  Mervyn Gay, PharmD, BCPS []  Vinnie Level, PharmD  [facility] []  Len Childs, PharmD []  Greer Pickerel, PharmD []  Adalberto Cole, PharmD []  Perlie Gold, Rph []  Lonell Face) Jean Rosenthal, PharmD []  Earl Many, PharmD []  Junita Push, PharmD []  Dorna Leitz, PharmD []  Terrilee Files, PharmD []  Lynann Beaver, PharmD []  Keturah Barre, PharmD []  Loralee Pacas, PharmD []  Bernadene Person, PharmD   Positive urine culture F/U with ID on 02/16/23 for c diff management. and no further patient follow-up is required at this time.  Jannifer Hick, PA  [place] 02/13/2023, 11:09 AM

## 2023-02-15 ENCOUNTER — Other Ambulatory Visit (HOSPITAL_COMMUNITY): Payer: Self-pay | Admitting: *Deleted

## 2023-02-15 ENCOUNTER — Ambulatory Visit: Payer: Self-pay | Admitting: Licensed Clinical Social Worker

## 2023-02-15 NOTE — Patient Outreach (Signed)
  Care Coordination  Follow Up Visit Note   02/15/2023 Name: Tami [name] MRN: [id number] DOB: 1956-02-12  Tami [name] is a 67 y.o. year old female who sees [name], MD for primary care. I spoke with  [name] by phone today.  What matters to the patients health and wellness today?  Unable to keep phone appointment today.  She has not been able to call to get PCS assessment scheduled    SDOH assessments and interventions completed:  No    Care Coordination Interventions:  No, not indicated   Follow up plan:  will follow up in 3 to 7 days to reschedule call    Encounter Outcome:  Pt. Visit Completed   [name], LCSW Social Work Care Coordination  [facility] Emmie [employer]iemann [employer] [phone]

## 2023-02-15 NOTE — Patient Instructions (Signed)
  It was a pleasure speaking with you today. I am sorry you were unable to keep your phone appointment today.   I will call you to get you rescheduled  [name], LCSW Social Work Care Coordination  [phone]

## 2023-02-16 ENCOUNTER — Telehealth: Payer: Self-pay

## 2023-02-16 ENCOUNTER — Ambulatory Visit (HOSPITAL_COMMUNITY)
Admission: RE | Admit: 2023-02-16 | Discharge: 2023-02-16 | Disposition: A | Discharge status: Home / Self Care | Payer: 59 | Source: Ambulatory Visit | Attending: Internal Medicine | Admitting: Internal Medicine

## 2023-02-16 DIAGNOSIS — A0472 Enterocolitis due to Clostridium difficile, not specified as recurrent: Secondary | ICD-10-CM | POA: Diagnosis present

## 2023-02-16 MED ORDER — SODIUM CHLORIDE 0.9 % IV SOLN
10.0000 mg/kg | Freq: Once | INTRAVENOUS | Status: AC
  Administered 2023-02-16: 871 mg via INTRAVENOUS
  Filled 2023-02-16: qty 34.84

## 2023-02-16 NOTE — Telephone Encounter (Signed)
Patient called office to confirm if she needed to continue with vancomycin taper after Zinplava infusion. Per Dr. Drue Second patient will need to complete vanc.  Left voicemail requesting call back.  Juanita Laster, RMA

## 2023-02-22 ENCOUNTER — Inpatient Hospital Stay: Payer: 59 | Attending: Hematology & Oncology

## 2023-02-22 ENCOUNTER — Inpatient Hospital Stay (HOSPITAL_BASED_OUTPATIENT_CLINIC_OR_DEPARTMENT_OTHER): Payer: 59 | Admitting: Hematology & Oncology

## 2023-02-22 ENCOUNTER — Encounter: Payer: Self-pay | Admitting: Hematology & Oncology

## 2023-02-22 VITALS — BP 159/103 | HR 89 | Temp 98.2°F | Resp 20 | Ht 66.0 in | Wt 194.1 lb

## 2023-02-22 DIAGNOSIS — C9000 Multiple myeloma not having achieved remission: Secondary | ICD-10-CM

## 2023-02-22 DIAGNOSIS — R197 Diarrhea, unspecified: Secondary | ICD-10-CM | POA: Insufficient documentation

## 2023-02-22 LAB — CMP (CANCER CENTER ONLY)
ALT: 13 U/L (ref 0–44)
AST: 26 U/L (ref 15–41)
Albumin: 3.5 g/dL (ref 3.5–5.0)
Alkaline Phosphatase: 46 U/L (ref 38–126)
Anion gap: 6 (ref 5–15)
BUN: 13 mg/dL (ref 8–23)
CO2: 27 mmol/L (ref 22–32)
Calcium: 8.9 mg/dL (ref 8.9–10.3)
Chloride: 108 mmol/L (ref 98–111)
Creatinine: 0.87 mg/dL (ref 0.44–1.00)
GFR, Estimated: >60 mL/min (ref >60)
Glucose, Bld: 93 mg/dL (ref 70–99)
Potassium: 4 mmol/L (ref 3.5–5.1)
Sodium: 141 mmol/L (ref 135–145)
Total Bilirubin: 0.3 mg/dL (ref 0.3–1.2)
Total Protein: 8.4 g/dL — ABNORMAL HIGH (ref 6.5–8.1)

## 2023-02-22 LAB — CBC WITH DIFFERENTIAL (CANCER CENTER ONLY)
Abs Immature Granulocytes: 0 10*3/uL (ref 0.00–0.07)
Basophils Absolute: 0 10*3/uL (ref 0.0–0.1)
Basophils Relative: 1 %
Eosinophils Absolute: 0 10*3/uL (ref 0.0–0.5)
Eosinophils Relative: 2 %
HCT: 37.3 % (ref 36.0–46.0)
Hemoglobin: 12.1 g/dL (ref 12.0–15.0)
Immature Granulocytes: 0 %
Lymphocytes Relative: 49 %
Lymphs Abs: 1.2 10*3/uL (ref 0.7–4.0)
MCH: 29 pg (ref 26.0–34.0)
MCHC: 32.4 g/dL (ref 30.0–36.0)
MCV: 89.4 fL (ref 80.0–100.0)
Monocytes Absolute: 0.2 10*3/uL (ref 0.1–1.0)
Monocytes Relative: 10 %
Neutro Abs: 0.9 10*3/uL — ABNORMAL LOW (ref 1.7–7.7)
Neutrophils Relative %: 38 %
Platelet Count: 95 10*3/uL — ABNORMAL LOW (ref 150–400)
RBC: 4.17 MIL/uL (ref 3.87–5.11)
RDW: 13.4 % (ref 11.5–15.5)
WBC Count: 2.4 10*3/uL — ABNORMAL LOW (ref 4.0–10.5)
nRBC: 0 % (ref 0.0–0.2)

## 2023-02-22 LAB — FERRITIN: Ferritin: 402 ng/mL — ABNORMAL HIGH (ref 11–307)

## 2023-02-22 LAB — LACTATE DEHYDROGENASE: LDH: 181 U/L (ref 98–192)

## 2023-02-22 NOTE — Assessment & Plan Note (Signed)
hgba1c acceptable, minimize simple carbs. Increase exercise as tolerated.  

## 2023-02-22 NOTE — Assessment & Plan Note (Signed)
Follows with oncology

## 2023-02-22 NOTE — Assessment & Plan Note (Signed)
Well controlled, no changes to meds. Encouraged heart healthy diet such as the DASH diet and exercise as tolerated.  °

## 2023-02-22 NOTE — Assessment & Plan Note (Signed)
Supplement and monitor 

## 2023-02-22 NOTE — Telephone Encounter (Signed)
Patient called complaining of increase diarrhea and would like to know what she should do.  Patient reports still taking the cancomycin taper as well. Per Dr. Drue Second patient can try taking 2 Imodium tablets to see if that helps with the diarrhea. Patient informed and verbalized understanding and will keep follow up appointment Monday. Patient also informed to make sure she is staying hydrated. Tami [name] T Pricilla Loveless

## 2023-02-22 NOTE — Progress Notes (Signed)
Hematology and Oncology Follow Up Visit  Tami [name] [id number] November 21, 1955 67 y.o. 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  Unfortunately, she is problems with diarrhea.  She goes 4 or 5 times a day.  She has had the C. difficile.  It sounds like she may be headed for a fecal transplant.  I think she sees a Solicitor later on this week.  As far as the myeloma is concerned, this has been doing okay.  When we last saw her, her M spike was 2.1 g/dL.  The IgG level was 3400 mg/dL.  The Kappa light chain was 10 mg/dL.  She has had no fever.  There has been no bleeding.  She has had no urinary issues.  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  Thankfully, there is been no problems with this..  She has had no rashes.  There is been no swollen lymph nodes.  She has had no leg swelling.  She has had no cough or shortness of breath.  Overall, I would say that her performance status is probably ECOG 1.   Medications:  Allergies as of 02/22/2023       Reactions   Benazepril Anaphylaxis, Swelling, Hives   angioedema Throat and lip swelling   Ondansetron Hcl Hives   Redness and hives post IV admin on 07/05/17   Codeine Nausea And Vomiting   Morphine Hives   Redness and hives noted post IV admin on 07/05/17        Medication List        Accurate as of February 22, 2023  3:57 PM. If you have any questions, ask your nurse or doctor.          STOP taking these medications    acetaminophen 500 MG tablet Commonly known as: TYLENOL Stopped by: Josph Macho, MD    Shingrix injection Generic drug: Zoster Vaccine Adjuvanted Stopped by: Josph Macho, MD       TAKE these medications    acyclovir 400 MG tablet Commonly known as: ZOVIRAX TAKE ONE (1) TABLET BY MOUTH TWO (2) TIMES DAILY   albuterol 108 (90 Base) MCG/ACT inhaler Commonly known as: VENTOLIN HFA Inhale 2 puffs into the lungs every 6 (six) hours as needed for wheezing or shortness of breath.   albuterol (2.5 MG/3ML) 0.083% nebulizer solution Commonly known as: PROVENTIL Take 3 mLs (2.5 mg total) by nebulization every 6 (six) hours as needed for wheezing or shortness of breath.   aspirin EC 81 MG tablet Take 1 tablet (81 mg total) by mouth daily. What changed: when to take this   budesonide 0.5 MG/2ML nebulizer solution Commonly known as: Pulmicort Take 2 mLs (0.5 mg total) by nebulization 2 (two) times daily as needed (cough, wheezing or shortness of breath).   cetirizine 10 MG tablet Commonly known as: ZYRTEC Take 10 mg by mouth at bedtime.   colestipol 1 g tablet Commonly known as: COLESTID Take 3-4 tablets (3-4 g total) by mouth every 12 (twelve) hours. What changed: how much to take  dicyclomine 10 MG capsule Commonly known as: BENTYL Take 1 capsule (10 mg total) by mouth 4 (four) times daily -  before meals and at bedtime.   fluticasone 50 MCG/ACT nasal spray Commonly known as: FLONASE Place 2 sprays into both nostrils daily.   furosemide 20 MG tablet Commonly known as: LASIX Take 20 mg by mouth daily as needed (fluid retention (feet swelling)).   hyoscyamine 0.125 MG SL tablet Commonly known as: LEVSIN SL Place 1 tablet (0.125 mg total) under the tongue every 4 (four) hours as needed.   loperamide 2 MG capsule Commonly known as: IMODIUM Take 1 capsule (2 mg total) by mouth 4 (four) times daily as needed for diarrhea or loose stools.   losartan 50 MG tablet Commonly known as: COZAAR Take 50 mg by mouth in the morning and at bedtime.   Magnesium  Glycinate 100 MG Caps Take 400 mg by mouth at bedtime.   meclizine 12.5 MG tablet Commonly known as: ANTIVERT Take by mouth 2 (two) times daily as needed.   meloxicam 15 MG tablet Commonly known as: MOBIC Take 1 tablet by mouth daily.   metoprolol succinate 50 MG 24 hr tablet Commonly known as: TOPROL-XL Take 1 tablet by mouth daily.   nortriptyline 10 MG capsule Commonly known as: Pamelor Take 1 capsule (10 mg total) by mouth at bedtime.   oxyCODONE-acetaminophen 5-325 MG tablet Commonly known as: PERCOCET/ROXICET Take by mouth.   potassium chloride SA 20 MEQ tablet Commonly known as: KLOR-CON M Take 1 tablet (20 mEq total) by mouth daily.   prochlorperazine 10 MG tablet Commonly known as: COMPAZINE Take 0.5 tablets (5 mg total) by mouth 2 (two) times daily as needed for up to 5 doses for nausea or vomiting.   Quintabs Tabs Take 1 tablet by mouth daily.   spironolactone 25 MG tablet Commonly known as: ALDACTONE Take 1 tablet (25 mg total) by mouth daily.   sucralfate 1 g tablet Commonly known as: Carafate Take 1 tablet (1 g total) by mouth 4 (four) times daily -  with meals and at bedtime.   topiramate 50 MG tablet Commonly known as: TOPAMAX Take 50 mg by mouth daily.   vancomycin 125 MG capsule Commonly known as: VANCOCIN Take 1 capsule (125 mg total) by mouth 4 (four) times daily for 10 days, THEN 1 capsule (125 mg total) in the morning, at noon, and at bedtime for 7 days, THEN 1 capsule (125 mg total) in the morning and at bedtime for 7 days, THEN 1 capsule (125 mg total) daily for 7 days. Start taking on: February 03, 2023   Vitamin D3 50 MCG (2000 UT) Tabs Take 2,000 Units by mouth in the morning.        Allergies:  Allergies  Allergen Reactions   Benazepril Anaphylaxis, Swelling and Hives    angioedema Throat and lip swelling   Ondansetron Hcl Hives    Redness and hives post IV admin on 07/05/17   Codeine Nausea And Vomiting   Morphine Hives     Redness and hives noted post IV admin on 07/05/17    Past Medical History, Surgical history, Social history, and Family History were reviewed and updated.  Review of Systems: Review of Systems  Constitutional: Negative.   HENT: Negative.    Eyes: Negative.   Respiratory: Negative.    Cardiovascular: Negative.   Gastrointestinal: Negative.   Genitourinary: Negative.   Musculoskeletal: Negative.   Skin: Negative.   Neurological: Negative.   Endo/Heme/Allergies:  Negative.   Psychiatric/Behavioral: Negative.        Physical Exam:  height is 5\' 6"  (1.676 m) and weight is 194 lb 1.3 oz (88 kg). Her oral temperature is 98.2 F (36.8 C). Her blood pressure is 159/103 (abnormal) and her pulse is 89. Her respiration is 20 and oxygen saturation is 99%.   Wt Readings from Last 3 Encounters:  02/22/23 194 lb 1.3 oz (88 kg)  02/16/23 192 lb (87.1 kg)  02/10/23 192 lb (87.1 kg)    Physical Exam Vitals reviewed.  HENT:     Head: Normocephalic and atraumatic.  Eyes:     Pupils: Pupils are equal, round, and reactive to light.     Comments: Mild pallor of conjunctiva  Cardiovascular:     Rate and Rhythm: Normal rate and regular rhythm.     Heart sounds: Normal heart sounds.  Pulmonary:     Effort: Pulmonary effort is normal.     Breath sounds: Normal breath sounds.  Abdominal:     General: Bowel sounds are normal.     Palpations: Abdomen is soft.  Musculoskeletal:        General: No tenderness or deformity. Normal range of motion.     Cervical back: Normal range of motion.  Lymphadenopathy:     Cervical: No cervical adenopathy.  Skin:    General: Skin is warm and dry.     Findings: No erythema or rash.  Neurological:     Mental Status: She is alert and oriented to person, place, and time.  Psychiatric:        Behavior: Behavior normal.        Thought Content: Thought content normal.        Judgment: Judgment normal.     Lab Results  Component Value Date   WBC 2.4 (L)  02/22/2023   HGB 12.1 02/22/2023   HCT 37.3 02/22/2023   MCV 89.4 02/22/2023   PLT 95 (L) 02/22/2023   Lab Results  Component Value Date   FERRITIN 519 (H) 11/27/2022   IRON 77 11/27/2022   TIBC 332 11/27/2022   UIBC 255 11/27/2022   IRONPCTSAT 23 11/27/2022   Lab Results  Component Value Date   RETICCTPCT 1.2 11/27/2022   RBC 4.17 02/22/2023   RETICCTABS 32,640 07/16/2016   Lab Results  Component Value Date   KPAFRELGTCHN 99.8 (H) 01/01/2023   LAMBDASER 7.2 01/01/2023   KAPLAMBRATIO 13.86 (H) 01/01/2023   Lab Results  Component Value Date   IGGSERUM 3,407 (H) 01/01/2023   IGA 180 01/01/2023   IGMSERUM 145 01/01/2023   Lab Results  Component Value Date   TOTALPROTELP 8.5 01/01/2023   ALBUMINELP 3.4 01/01/2023   A1GS 0.2 01/01/2023   A2GS 0.9 01/01/2023   BETS 1.1 01/01/2023   BETA2SER 0.5 07/16/2016   GAMS 2.8 (H) 01/01/2023   MSPIKE 2.1 (H) 01/01/2023   SPEI Comment 10/27/2022     Chemistry      Component Value Date/Time   NA 141 02/22/2023 1500   NA 143 11/08/2017 1127   NA 140 12/31/2016 1000   K 4.0 02/22/2023 1500   K 4.0 11/08/2017 1127   K 3.6 12/31/2016 1000   CL 108 02/22/2023 1500   CL 107 11/08/2017 1127   CO2 27 02/22/2023 1500   CO2 25 11/08/2017 1127   CO2 24 12/31/2016 1000   BUN 13 02/22/2023 1500   BUN 19 11/08/2017 1127   BUN 22.3 12/31/2016 1000   CREATININE 0.87 02/22/2023  1500   CREATININE 0.9 11/08/2017 1127   CREATININE 0.8 12/31/2016 1000      Component Value Date/Time   CALCIUM 8.9 02/22/2023 1500   CALCIUM 9.9 11/08/2017 1127   CALCIUM 9.5 12/31/2016 1000   ALKPHOS 46 02/22/2023 1500   ALKPHOS 44 11/08/2017 1127   ALKPHOS 51 12/31/2016 1000   AST 26 02/22/2023 1500   AST 28 12/31/2016 1000   ALT 13 02/22/2023 1500   ALT 36 11/08/2017 1127   ALT 16 12/31/2016 1000   BILITOT 0.3 02/22/2023 1500   BILITOT 0.46 12/31/2016 1000       Impression and Plan: Ms. Barnfield is a very pleasant 67 yo African American female  with a previous IgG kappa MGUS with progression to myeloma.   Hopefully, her monoclonal studies are better.  Her total protein is only 8.4.  Clearly, the biggest problem is this diarrhea.  This is what is detailed in her life and her quality of life.  She is some IV fluids.  We will give her some IV fluids tomorrow.  I hope that this diarrhea can get under better control.  We will plan to see her back in another 4 or 5 weeks.  She can certainly come in sooner if she has any problems. Josph Macho, MD 4/15/20243:57 PM

## 2023-02-22 NOTE — Assessment & Plan Note (Signed)
Encourage heart healthy diet such as MIND or DASH diet, increase exercise, avoid trans fats, simple carbohydrates and processed foods, consider a krill or fish or flaxseed oil cap daily.  °

## 2023-02-22 NOTE — Assessment & Plan Note (Signed)
Follows with cardiology, no recent exacerbation

## 2023-02-23 ENCOUNTER — Ambulatory Visit (INDEPENDENT_AMBULATORY_CARE_PROVIDER_SITE_OTHER): Payer: 59 | Admitting: Family Medicine

## 2023-02-23 ENCOUNTER — Encounter: Payer: Self-pay | Admitting: Family Medicine

## 2023-02-23 ENCOUNTER — Inpatient Hospital Stay: Payer: 59

## 2023-02-23 VITALS — BP 172/88 | HR 79 | Temp 97.0°F | Resp 20

## 2023-02-23 VITALS — BP 160/86 | HR 96 | Temp 97.5°F | Resp 16 | Ht 66.0 in | Wt 196.6 lb

## 2023-02-23 DIAGNOSIS — R739 Hyperglycemia, unspecified: Secondary | ICD-10-CM

## 2023-02-23 DIAGNOSIS — I5042 Chronic combined systolic (congestive) and diastolic (congestive) heart failure: Secondary | ICD-10-CM

## 2023-02-23 DIAGNOSIS — E569 Vitamin deficiency, unspecified: Secondary | ICD-10-CM | POA: Diagnosis not present

## 2023-02-23 DIAGNOSIS — R197 Diarrhea, unspecified: Secondary | ICD-10-CM | POA: Diagnosis not present

## 2023-02-23 DIAGNOSIS — D500 Iron deficiency anemia secondary to blood loss (chronic): Secondary | ICD-10-CM

## 2023-02-23 DIAGNOSIS — C9000 Multiple myeloma not having achieved remission: Secondary | ICD-10-CM

## 2023-02-23 DIAGNOSIS — K529 Noninfective gastroenteritis and colitis, unspecified: Secondary | ICD-10-CM

## 2023-02-23 DIAGNOSIS — I10 Essential (primary) hypertension: Secondary | ICD-10-CM | POA: Diagnosis not present

## 2023-02-23 DIAGNOSIS — R002 Palpitations: Secondary | ICD-10-CM | POA: Diagnosis not present

## 2023-02-23 DIAGNOSIS — I509 Heart failure, unspecified: Secondary | ICD-10-CM | POA: Diagnosis not present

## 2023-02-23 DIAGNOSIS — E785 Hyperlipidemia, unspecified: Secondary | ICD-10-CM

## 2023-02-23 DIAGNOSIS — M79604 Pain in right leg: Secondary | ICD-10-CM | POA: Diagnosis not present

## 2023-02-23 DIAGNOSIS — R0989 Other specified symptoms and signs involving the circulatory and respiratory systems: Secondary | ICD-10-CM | POA: Diagnosis not present

## 2023-02-23 DIAGNOSIS — M79605 Pain in left leg: Secondary | ICD-10-CM | POA: Diagnosis not present

## 2023-02-23 DIAGNOSIS — R350 Frequency of micturition: Secondary | ICD-10-CM

## 2023-02-23 LAB — URINALYSIS, ROUTINE W REFLEX MICROSCOPIC
Bilirubin Urine: NEGATIVE
Ketones, ur: NEGATIVE
Nitrite: NEGATIVE
Specific Gravity, Urine: 1.025 (ref 1.000–1.030)
Total Protein, Urine: NEGATIVE
Urine Glucose: NEGATIVE
Urobilinogen, UA: 0.2 (ref 0.0–1.0)
pH: 6 (ref 5.0–8.0)

## 2023-02-23 LAB — IRON AND IRON BINDING CAPACITY (CC-WL,HP ONLY)
Iron: 66 ug/dL (ref 28–170)
Saturation Ratios: 19 % (ref 10.4–31.8)
TIBC: 346 ug/dL (ref 250–450)
UIBC: 280 ug/dL (ref 148–442)

## 2023-02-23 LAB — BETA 2 MICROGLOBULIN, SERUM: Beta-2 Microglobulin: 1.7 mg/L (ref 0.6–2.4)

## 2023-02-23 LAB — KAPPA/LAMBDA LIGHT CHAINS
Kappa free light chain: 111.8 mg/L — ABNORMAL HIGH (ref 3.3–19.4)
Kappa, lambda light chain ratio: 18.95 — ABNORMAL HIGH (ref 0.26–1.65)
Lambda free light chains: 5.9 mg/L (ref 5.7–26.3)

## 2023-02-23 MED ORDER — SODIUM CHLORIDE 0.9 % IV SOLN: INTRAVENOUS | Status: DC

## 2023-02-23 MED ORDER — METOPROLOL SUCCINATE ER 50 MG PO TB24
50.0000 mg | ORAL_TABLET | Freq: Two times a day (BID) | ORAL | 2 refills | Status: AC
Start: 2023-02-23 — End: ?

## 2023-02-23 NOTE — Patient Instructions (Addendum)
Metoprolol increase to 50 mg twice daily   Clostridioides Difficile Infection Clostridioides difficile infection, or C. diff, is an infection that is caused by C. diff germs (bacteria). This infection may happen after you take antibiotics that kill other germs and let C. diff germs grow. C. diff can be spread from person to person (is contagious). What are the causes? Taking certain antibiotics. Coming in contact with people, food, or things that have C. diff. What increases the risk? Taking certain antibiotics for a long time. Staying in a hospital or long-term care facility for a long time. Being age 69 or older. Having had C. diff before or been exposed to C. diff. Having a weak disease-fighting system (immune system). Taking medicines that treat stomach acid. Having serious health problems, including: Colon cancer. Inflammatory bowel disease (IBD). Having had a procedure or surgery on your digestive system. What are the signs or symptoms? Watery poop (diarrhea). Fever. Not feeling hungry. Feeling like you may vomit. Swelling, pain, cramps, or a tender belly. How is this treated? Treatment may include: Stopping the antibiotics that caused the C. diff infection. Taking antibiotics that kill C. diff. Placing poop from a healthy person into your colon (fecal transplant). Doing surgery to take out the infected part of the colon. Follow these instructions at home: Medicines Take over-the-counter and prescription medicines only as told by your doctor. Take antibiotic medicine as told by your doctor. Do not stop taking it even if you start to feel better. Do not take medicines to treat watery poop unless your doctor tells you to. Eating and drinking  Follow instructions from your doctor about what to eat and drink. This may include eating bland foods in small amounts, such as: Bananas. Applesauce. Rice. Lean meats. Toast. Crackers. To prevent loss of fluid in your body  (dehydration): Take in enough fluids to keep your pee pale yellow. This includes water, ice chips, clear fruit juice with water added to it, or low-calorie sports drinks. Take an ORS (oral rehydration solution). This drink is sold in pharmacies and retail stores. Avoid milk, caffeine, and alcohol. General instructions Wash your hands often with soap and water. Do this for at least 20 seconds. Take a bath or shower every day. Return to your normal activities when your doctor says that it is safe. Keep all follow-up visits. How is this prevented? Personal hygiene  Wash your hands often with soap and water. Do this for at least 20 seconds. Wash your hands before you cook and after you use the bathroom. Other people should wash their hands too, especially: People who live with you. People who visit you in a hospital or clinic. Contact precautions If you get watery poop while you are in the hospital or a long-term care facility, tell your doctor right away. When you visit someone in the hospital or a long-term care facility, wear a gown, gloves, or other protection. If possible: Stay away from people who have diarrhea. Use a separate bathroom if you are sick and live with other people. Clean environment Keep your home clean. Clean your home every day for at least a week after you leave the hospital. Clean surfaces that you touch every day. Use a product that has a 10% chlorine bleach solution. Be sure to: Read the label on your product to make sure that the product will kill the germs on your surfaces. Clean toilets and flush handles, bathtubs, sinks, doorknobs and handles, countertops, and work surfaces. If you are in the  hospital, make sure the surfaces in your room are cleaned each day. Tell someone right away if body fluids have splashed or spilled. Clothes and linens Wash clothes and linens using laundry soap that has chlorine bleach. Be sure to: Use powder soap instead of  liquid. Clean your washing machine once a month. To do this, turn on the hot setting with only soap in it. Contact a doctor if: Your symptoms do not get better or they get worse. Your symptoms go away and then come back. You have a fever. You have new symptoms. Get help right away if: Your belly is more tender or you have more pain. Your poop is mostly bloody. Your poop looks black. You vomit after you eat or drink. You have signs of not having enough fluids in your body. These include: Dark yellow pee, very little pee, or no pee. Cracked lips or dry mouth. No tears when you cry. Sunken eyes. Feeling sleepy. Feeling weak or dizzy. Summary C. diff infection is an infection that may happen after you take antibiotic medicines. Symptoms include watery poop, fever, not feeling hungry, or feeling like you may vomit. Treatment includes stopping the antibiotics that made you sick and taking antibiotics that kill the C. diff germs. Poop from a healthy person may also be placed into your colon. To prevent C. diff infectionfrom spreading, wash hands often with soap and water. Do this for at least 20 seconds. Keep your home clean. This information is not intended to replace advice given to you by your health care provider. Make sure you discuss any questions you have with your health care provider. Document Revised: 02/15/2020 Document Reviewed: 02/15/2020 Elsevier Patient Education  2023 [employer].

## 2023-02-23 NOTE — Assessment & Plan Note (Signed)
Despite having treatment with ID she continues to have 4 diarrheal movements daily and recurrent dehydration. Got IVF again today. She has been advised she can use Imodium twice daily so is trying to add that and can try Questran as well. She has an appt with ID next week

## 2023-02-23 NOTE — Assessment & Plan Note (Signed)
And some worsening low back pain check UA and culture

## 2023-02-23 NOTE — Progress Notes (Signed)
Subjective:    [name], female    DOB: December 25, 1955, 67 y.o.   MRN: [id number]  Chief Complaint  Patient presents with   Follow-up    Follow up    HPI Patient is in today for follow up on chronic medical concerns. No recent febrile illness or hospitalizations. Denies CP/palp/SOB/HA/congestion. Taking meds as prescribed. No documented fevers but she feels hot and sweaty then cold. She has 4 loose stool daily but no bloody stool. She struggles with nausea and vomiting as well but is drinking fluids and trying to eat protein. She had to have IVF today again. She notes recent increase in low back pain and urinary symptoms  Past Medical History:  Diagnosis Date   Abnormal SPEP 07/26/2016   Arthritis    knees, hands   Back pain 07/14/2016   Bowel obstruction 11/2014   C. 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   Iron deficiency anemia due to chronic blood loss 05/28/2022   Low back pain 07/14/2016   Monoclonal gammopathy of unknown significance (MGUS) 09/16/2016   Multiple myeloma 07/20/2018   Multiple myeloma not having achieved remission 07/20/2018   Myalgia 12/31/2016   Obstructive sleep apnea 03/31/2015   Presence of permanent cardiac pacemaker    SOB (shortness of breath) 04/09/2016   Stroke    TIAs   UTI (urinary tract infection)     Past Surgical History:  Procedure Laterality Date   ABDOMINAL HYSTERECTOMY     menorraghia, 2006, total   BIOPSY  09/01/2022   Procedure: BIOPSY;  Surgeon: Lynann Bologna, MD;  Location: [facility];  Service: Gastroenterology;;   CHOLECYSTECTOMY     COLONOSCOPY  2018   [facility] per patient   COLONOSCOPY WITH PROPOFOL N/A  09/01/2022   Procedure: COLONOSCOPY WITH PROPOFOL;  Surgeon: Lynann Bologna, MD;  Location: [facility];  Service: Gastroenterology;  Laterality: N/A;   ESOPHAGOGASTRODUODENOSCOPY  2018   [facility]    ESOPHAGOGASTRODUODENOSCOPY (EGD) WITH PROPOFOL N/A 09/01/2022   Procedure: ESOPHAGOGASTRODUODENOSCOPY (EGD) WITH PROPOFOL;  Surgeon: Lynann Bologna, MD;  Location: [facility];  Service: Gastroenterology;  Laterality: N/A;   KNEE SURGERY     right, repair torn torn cartialage   PACEMAKER INSERTION  08/2014   TONSILLECTOMY     TUBAL LIGATION      Family History  Problem Relation Age of Onset   Diabetes Mother    Hypertension Mother    Heart disease Mother        s/p 1 stent   Hyperlipidemia Mother    Arthritis Mother    Cancer Father        COLON   Colon cancer Father 78   Irritable bowel syndrome Sister    Hyperlipidemia Daughter    Hypertension Daughter    Hypertension Maternal Grandmother    Arthritis Maternal Grandmother    Heart disease Maternal Grandfather        MI   Hypertension Maternal Grandfather    Arthritis Maternal Grandfather    Hypertension Son    Esophageal cancer Neg Hx     Social History   Socioeconomic History   Marital status: Divorced  Spouse name: [name]   Number of children: 3   Years of education: [name]   Highest education level: High school graduate  Occupational History   [name]  Tobacco Use   Smoking status: Never    Passive exposure: Never   Smokeless tobacco: Never  Vaping Use   Vaping Use: Never used  Substance and Sexual Activity   Alcohol use: No   Drug use: No   Sexual activity: Not Currently  Other Topics Concern   [name]  Social History Narrative   Lives at home alone   Retired   Caffeine: coffee   Social Determinants of Health   Financial Resource Strain: Low Risk  (10/17/2021)   Overall Financial Resource Strain (CARDIA)    Difficulty of Paying Living Expenses: Not hard at all   Food Insecurity: No Food Insecurity (01/21/2023)   Hunger Vital Sign    Worried About Running Out of Food in the Last Year: Never true    Ran Out of Food in the Last Year: Never true  Transportation Needs: No Transportation Needs (01/21/2023)   PRAPARE - Administrator, Civil Service (Medical): No    Lack of Transportation (Non-Medical): No  Physical Activity: Insufficiently Active (10/17/2021)   Exercise Vital Sign    Days of Exercise per Week: 2 days    Minutes of Exercise per Session: 30 min  Stress: No Stress Concern Present (01/21/2023)   [employer] of Occupational Health - Occupational Stress Questionnaire    Feeling of Stress : Not at all  Social Connections: Moderately Isolated (10/17/2021)   Social Connection and Isolation Panel [NHANES]    Frequency of Communication with Friends and Family: More than three times a week    Frequency of Social Gatherings with Friends and Family: More than three times a week    Attends Religious Services: More than 4 times per year    Active Member of [employer] or Organizations: No    Attends Banker Meetings: Never    Marital Status: Divorced  Catering manager Violence: Not At Risk (10/17/2021)   Humiliation, Afraid, Rape, and Kick questionnaire    Fear of Current or Ex-Partner: No    Emotionally Abused: No    Physically Abused: No    Sexually Abused: No    Outpatient Medications Prior to Visit  Medication Sig Dispense Refill   acyclovir (ZOVIRAX) 400 MG tablet TAKE ONE (1) TABLET BY MOUTH TWO (2) TIMES DAILY 60 tablet 2   albuterol (PROVENTIL) (2.5 MG/3ML) 0.083% nebulizer solution Take 3 mLs (2.5 mg total) by nebulization every 6 (six) hours as needed for wheezing or shortness of breath. 150 mL 2   albuterol (VENTOLIN HFA) 108 (90 Base) MCG/ACT inhaler Inhale 2 puffs into the lungs every 6 (six) hours as needed for wheezing or shortness of breath. 18 g 5   aspirin EC 81 MG tablet Take 1 tablet (81 mg total) by  mouth daily. (Patient taking differently: Take 81 mg by mouth every evening.) 90 tablet 1   budesonide (PULMICORT) 0.5 MG/2ML nebulizer solution Take 2 mLs (0.5 mg total) by nebulization 2 (two) times daily as needed (cough, wheezing or shortness of breath). 120 mL 11   cetirizine (ZYRTEC) 10 MG tablet Take 10 mg by mouth at bedtime.     Cholecalciferol (VITAMIN D3) 50 MCG (2000 UT) TABS Take 2,000 Units by mouth in the morning.     colestipol (COLESTID) 1 g tablet Take  3-4 tablets (3-4 g total) by mouth every 12 (twelve) hours. (Patient taking differently: Take 2 g by mouth every 12 (twelve) hours.) 240 tablet 3   dicyclomine (BENTYL) 10 MG capsule Take 1 capsule (10 mg total) by mouth 4 (four) times daily -  before meals and at bedtime. 120 capsule 2   fluticasone (FLONASE) 50 MCG/ACT nasal spray Place 2 sprays into both nostrils daily. 16 g 0   furosemide (LASIX) 20 MG tablet Take 20 mg by mouth daily as needed (fluid retention (feet swelling)).     hyoscyamine (LEVSIN SL) 0.125 MG SL tablet Place 1 tablet (0.125 mg total) under the tongue every 4 (four) hours as needed. 30 tablet 1   loperamide (IMODIUM) 2 MG capsule Take 1 capsule (2 mg total) by mouth 4 (four) times daily as needed for diarrhea or loose stools. 12 capsule 0   losartan (COZAAR) 50 MG tablet Take 50 mg by mouth in the morning and at bedtime.     Magnesium Glycinate 100 MG CAPS Take 400 mg by mouth at bedtime.     meclizine (ANTIVERT) 12.5 MG tablet Take by mouth 2 (two) times daily as needed.     meloxicam (MOBIC) 15 MG tablet Take 1 tablet by mouth daily.     Multiple Vitamin (QUINTABS) TABS Take 1 tablet by mouth daily.     nortriptyline (PAMELOR) 10 MG capsule Take 1 capsule (10 mg total) by mouth at bedtime. 30 capsule 1   oxyCODONE-acetaminophen (PERCOCET/ROXICET) 5-325 MG tablet Take by mouth.     potassium chloride SA (KLOR-CON M) 20 MEQ tablet Take 1 tablet (20 mEq total) by mouth daily. 90 tablet 1   prochlorperazine  (COMPAZINE) 10 MG tablet Take 0.5 tablets (5 mg total) by mouth 2 (two) times daily as needed for up to 5 doses for nausea or vomiting. 3 tablet 0   sucralfate (CARAFATE) 1 g tablet Take 1 tablet (1 g total) by mouth 4 (four) times daily -  with meals and at bedtime. 120 tablet 2   topiramate (TOPAMAX) 50 MG tablet Take 50 mg by mouth daily.     metoprolol succinate (TOPROL-XL) 50 MG 24 hr tablet Take 1 tablet by mouth daily.     spironolactone (ALDACTONE) 25 MG tablet Take 1 tablet (25 mg total) by mouth daily.     vancomycin (VANCOCIN) 125 MG capsule Take 1 capsule (125 mg total) by mouth 4 (four) times daily for 10 days, THEN 1 capsule (125 mg total) in the morning, at noon, and at bedtime for 7 days, THEN 1 capsule (125 mg total) in the morning and at bedtime for 7 days, THEN 1 capsule (125 mg total) daily for 7 days. 82 capsule 0   Facility-Administered Medications Prior to Visit  Medication Dose Route Frequency Provider Last Rate Last Admin   aspirin chewable tablet 162 mg  162 mg Oral Once [name], MD       nitroGLYCERIN (NITROSTAT) SL tablet 0.4 mg  0.4 mg Sublingual Once [name], MD        Allergies  Allergen Reactions   Benazepril Anaphylaxis, Swelling and Hives    angioedema Throat and lip swelling   Ondansetron Hcl Hives    Redness and hives post IV admin on 07/05/17   Codeine Nausea And Vomiting   Morphine Hives    Redness and hives noted post IV admin on 07/05/17    Review of Systems  Constitutional:  Positive for malaise/fatigue. Negative for  fever.  HENT:  Negative for congestion.   Eyes:  Negative for blurred vision.  Respiratory:  Negative for shortness of breath.   Cardiovascular:  Negative for chest pain, palpitations and leg swelling.  Gastrointestinal:  Positive for diarrhea, nausea and vomiting. Negative for abdominal pain, blood in stool and melena.  Genitourinary:  Positive for frequency and urgency. Negative for dysuria.  Musculoskeletal:   Positive for back pain. Negative for falls.  Skin:  Negative for rash.  Neurological:  Negative for dizziness, loss of consciousness and headaches.  Endo/Heme/Allergies:  Negative for environmental allergies.  Psychiatric/Behavioral:  Negative for depression. The patient is not nervous/anxious.        Objective:    Physical Exam Constitutional:      General: She is not in acute distress.    Appearance: Normal appearance. She is well-developed. She is not toxic-appearing.  HENT:     Head: Normocephalic and atraumatic.     Right Ear: External ear normal.     Left Ear: External ear normal.     Nose: Nose normal.  Eyes:     General:        Right eye: No discharge.        Left eye: No discharge.     Conjunctiva/sclera: Conjunctivae normal.  Neck:     Thyroid: No thyromegaly.  Cardiovascular:     Rate and Rhythm: Normal rate and regular rhythm.     Heart sounds: Normal heart sounds. No murmur heard. Pulmonary:     Effort: Pulmonary effort is normal. No respiratory distress.     Breath sounds: Normal breath sounds.  Abdominal:     General: Bowel sounds are normal.     Palpations: Abdomen is soft.     Tenderness: There is no abdominal tenderness. There is no guarding.  Musculoskeletal:        General: Normal range of motion.     Cervical back: Neck supple.  Lymphadenopathy:     Cervical: No cervical adenopathy.  Skin:    General: Skin is warm and dry.  Neurological:     Mental Status: She is alert and oriented to person, place, and time.  Psychiatric:        Mood and Affect: Mood normal.        Behavior: Behavior normal.        Thought Content: Thought content normal.        Judgment: Judgment normal.     BP (!) 160/86 (BP Location: Right Arm, Patient Position: Sitting, Cuff Size: Normal)   Pulse 96   Temp (!) 97.5 F (36.4 C) (Oral)   Resp 16   Ht 5\' 6"  (1.676 m)   Wt 196 lb 9.6 oz (89.2 kg)   SpO2 95%   BMI 31.73 kg/m  Wt Readings from Last 3 Encounters:   02/23/23 196 lb 9.6 oz (89.2 kg)  02/22/23 194 lb 1.3 oz (88 kg)  02/16/23 192 lb (87.1 kg)    Diabetic Foot Exam - Simple   No data filed    Lab Results  Component Value Date   WBC 2.4 (L) 02/22/2023   HGB 12.1 02/22/2023   HCT 37.3 02/22/2023   PLT 95 (L) 02/22/2023   GLUCOSE 93 02/22/2023   CHOL 171 04/21/2022   TRIG 105.0 04/21/2022   HDL 49.40 04/21/2022   LDLCALC 100 (H) 04/21/2022   ALT 13 02/22/2023   AST 26 02/22/2023   NA 141 02/22/2023   K 4.0 02/22/2023   CL 108  02/22/2023   CREATININE 0.87 02/22/2023   BUN 13 02/22/2023   CO2 27 02/22/2023   TSH 1.97 04/21/2022   INR 0.90 07/28/2018   HGBA1C 5.8 04/21/2022    Lab Results  Component Value Date   TSH 1.97 04/21/2022   Lab Results  Component Value Date   WBC 2.4 (L) 02/22/2023   HGB 12.1 02/22/2023   HCT 37.3 02/22/2023   MCV 89.4 02/22/2023   PLT 95 (L) 02/22/2023   Lab Results  Component Value Date   NA 141 02/22/2023   K 4.0 02/22/2023   CHLORIDE 108 12/31/2016   CO2 27 02/22/2023   GLUCOSE 93 02/22/2023   BUN 13 02/22/2023   CREATININE 0.87 02/22/2023   BILITOT 0.3 02/22/2023   ALKPHOS 46 02/22/2023   AST 26 02/22/2023   ALT 13 02/22/2023   PROT 8.4 (H) 02/22/2023   ALBUMIN 3.5 02/22/2023   CALCIUM 8.9 02/22/2023   ANIONGAP 6 02/22/2023   EGFR >90 12/31/2016   GFR 89.84 10/13/2022   Lab Results  Component Value Date   CHOL 171 04/21/2022   Lab Results  Component Value Date   HDL 49.40 04/21/2022   Lab Results  Component Value Date   LDLCALC 100 (H) 04/21/2022   Lab Results  Component Value Date   TRIG 105.0 04/21/2022   Lab Results  Component Value Date   CHOLHDL 3 04/21/2022   Lab Results  Component Value Date   HGBA1C 5.8 04/21/2022       Assessment & Plan:  Chronic combined systolic and diastolic congestive heart failure Assessment & Plan: Follows with cardiology, no recent exacerbation   Hyperglycemia Assessment & Plan: hgba1c acceptable, minimize  simple carbs. Increase exercise as tolerated.    Hyperlipidemia, unspecified hyperlipidemia type Assessment & Plan: Encourage heart healthy diet such as MIND or DASH diet, increase exercise, avoid trans fats, simple carbohydrates and processed foods, consider a krill or fish or flaxseed oil cap daily.    Primary hypertension Assessment & Plan: Well controlled, no changes to meds. Encouraged heart healthy diet such as the DASH diet and exercise as tolerated.     Hypomagnesemia Assessment & Plan: Supplement and monitor    Vitamin deficiency Assessment & Plan: Supplement and monitor    Multiple myeloma, remission status unspecified Assessment & Plan: Follows with oncology   Urinary frequency Assessment & Plan: And some worsening low back pain check UA and culture  Orders: -     Urinalysis, Routine w reflex microscopic -     Urine Culture  Palpitation -     Metoprolol Succinate ER; Take 1 tablet (50 mg total) by mouth 2 (two) times daily.  Dispense: 60 tablet; Refill: 2  Chronic diarrhea Assessment & Plan: Despite having treatment with ID she continues to have 4 diarrheal movements daily and recurrent dehydration. Got IVF again today. She has been advised she can use Imodium twice daily so is trying to add that and can try Questran as well. She has an appt with ID next week     Danise Edge, MD

## 2023-02-23 NOTE — Patient Instructions (Signed)
Dehydration, Adult Dehydration is a condition in which there is not enough water or other fluids in the body. This happens when a person loses more fluids than they take in. Important organs, such as the kidneys, brain, and heart, cannot function without a proper amount of fluids. Any loss of fluids from the body can lead to dehydration. Dehydration can be mild, moderate, or severe. It should be treated right away to prevent it from becoming severe. What are the causes? Dehydration may be caused by: Health conditions, such as diarrhea, vomiting, fever, infection, or sweating or urinating a lot. Not drinking enough fluids. Certain medicines, such as medicines that remove excess fluid from the body (diuretics). Lack of safe drinking water. Not being able to get enough water and food. What increases the risk? The following factors may make you more likely to develop this condition: Having a long-term (chronic) illness that has not been treated properly, such as diabetes, heart disease, or kidney disease. Being 65 years of age or older. Having a disability. Living in a place that is high in altitude, where thinner, drier air causes more fluid loss. Doing exercises that put stress on your body for a long time (endurance sports). Being active in a hot climate. What are the signs or symptoms? Symptoms of dehydration depend on how severe it is. Mild or moderate dehydration Thirst. Dry lips or dry mouth. Dizziness or light-headedness. Muscle cramps. Dark urine. Urine may be the color of tea. Less urine or tears produced than usual. Headache. Severe dehydration Changes in skin. Your skin may be cold and clammy, blotchy, or pale. Your skin also may not return to normal after being lightly pinched and released. Little or no tears, urine, or sweat. Rapid breathing and low blood pressure. Your pulse may be weak or may be faster than 100 beats per minute when you are sitting still. Other changes,  such as: Feeling very thirsty. Sunken eyes. Cold hands and feet. Confusion. Being very tired (lethargic) or having trouble waking from sleep. Short-term weight loss. Loss of consciousness. How is this diagnosed? This condition is diagnosed based on your symptoms and a physical exam. You may have blood and urine tests to help confirm the diagnosis. How is this treated? Treatment for this condition depends on how severe it is. Treatment should be started right away. Do not wait until dehydration becomes severe. Severe dehydration is an emergency and needs to be treated in a hospital. Mild or moderate dehydration can be treated at home. You may be asked to: Drink more fluids. Drink an oral rehydration solution (ORS). This drink restores fluids, salts, and minerals in the blood (electrolytes). Stop any activities that caused dehydration, such as exercise. Cool off with cool compresses, cool mist, or cool fluids, if heat or too much sweat caused your condition. Take medicine to treat fever, if fever caused your condition. Take medicine to treat nausea and diarrhea, if vomiting or diarrhea caused your condition. Severe dehydration can be treated: With IV fluids. By correcting abnormal levels of electrolytes in your body. By treating the underlying cause of dehydration. Follow these instructions at home: Oral rehydration solution If told by your health care provider, drink an ORS: Make an ORS by following instructions on the package. Start by drinking small amounts, about  cup (120 mL) every 5-10 minutes. Slowly increase how much you drink until you have taken the amount recommended by your health care provider.  Eating and drinking  Drink enough clear fluid to keep   your urine pale yellow. If you were told to drink an ORS, finish the ORS first and then start slowly drinking other clear fluids. Drink fluids such as: Water. Do not drink only water. Doing that can lead to hyponatremia, which  is having too little salt (sodium) in the body. Water from ice chips you suck on. Diluted fruit juice. This is fruit juice that you have added water to. Low-calorie sports drinks. Eat foods that contain a healthy balance of electrolytes, such as bananas, oranges, potatoes, tomatoes, and spinach. Do not drink alcohol. Avoid the following: Drinks that contain a lot of sugar. These include high-calorie sports drinks, fruit juice that is not diluted, and soda. Caffeine. Foods that are greasy or contain a lot of fat or sugar. General instructions Take over-the-counter and prescription medicines only as told by your health care provider. Do not take sodium tablets. Doing that can lead to having too much sodium in the body (hypernatremia). Return to your normal activities as told by your health care provider. Ask your health care provider what activities are safe for you. Keep all follow-up visits. Your health care provider may need to check your progress and suggest new ways to treat your condition. Contact a health care provider if: You have muscle cramps, pain, or discomfort, such as: Pain in your abdomen and the pain gets worse or stays in one area. Stiff neck. You have a rash. You are more irritable than usual. You are sleepier or have a harder time waking. You feel weak or dizzy. You feel very thirsty. Get help right away if: You have symptoms of severe dehydration. You vomit every time you eat or drink. Your vomiting gets worse, does not go away, or includes blood or green matter (bile). You are getting treatment but symptoms are getting worse. You have a fever. You have a severe headache. You have: Diarrhea that gets worse or does not go away. Blood in your stool. This may cause stool to look black and tarry. Not urinating, or urinating only a small amount of very dark urine, within 6-8 hours. You have trouble breathing. These symptoms may be an emergency. Get help right  away. Do not wait to see if the symptoms will go away. Do not drive yourself to the hospital. Call 911. This information is not intended to replace advice given to you by your health care provider. Make sure you discuss any questions you have with your health care provider. Document Revised: 05/25/2022 Document Reviewed: 05/25/2022 Elsevier Patient Education  2023 Elsevier Inc. 

## 2023-02-24 ENCOUNTER — Telehealth: Payer: Self-pay | Admitting: Family Medicine

## 2023-02-24 LAB — IGG, IGA, IGM
IgA: 177 mg/dL (ref 87–352)
IgG (Immunoglobin G), Serum: 3105 mg/dL — ABNORMAL HIGH (ref 586–1602)
IgM (Immunoglobulin M), Srm: 131 mg/dL (ref 26–217)

## 2023-02-24 NOTE — Telephone Encounter (Signed)
Pt called stating she was seen for an appt yesterday (4.16.24) and Dr. Abner Greenspan told her to follow up with her in 4-6 weeks. Per PCP schedule, did not see any available times in the frame and advised a note would have to be sent back to look into a possible work in for her.

## 2023-02-24 NOTE — Telephone Encounter (Signed)
Called pt made appt for June  And pt stated she will call to see if can be seen sooner.

## 2023-02-25 ENCOUNTER — Other Ambulatory Visit: Payer: Self-pay | Admitting: Family Medicine

## 2023-02-25 ENCOUNTER — Telehealth: Payer: Self-pay | Admitting: Family Medicine

## 2023-02-25 LAB — URINE CULTURE
MICRO NUMBER:: 14831027
SPECIMEN QUALITY:: ADEQUATE

## 2023-02-25 MED ORDER — NITROFURANTOIN MONOHYD MACRO 100 MG PO CAPS: 100.0000 mg | ORAL_CAPSULE | Freq: Two times a day (BID) | ORAL | 0 refills | Status: DC

## 2023-02-25 NOTE — Progress Notes (Signed)
   Care Guide Note  02/25/2023 Name: KHADIJAH CARBARY MRN: [id number] DOB: 1956/07/12  Referred by: [name], MD Reason for referral : Care Coordination (Outreach to schedule with Pharm d )   LISANNA KETT is a 67 y.o. year old female who is a primary care patient of [name], MD. [name] was referred to the pharmacist for assistance related to HTN.    Successful contact was made with the patient to discuss pharmacy services including being ready for the pharmacist to call at least 5 minutes before the scheduled appointment time, to have medication bottles and any blood sugar or blood pressure readings ready for review. The patient agreed to meet with the pharmacist via with the pharmacist via telephone visit on (date/time).  03/03/2023  Penne Lash, RMA Care Guide [facility]  [place], [place] [address] Direct Dial: [phone] [email or web address]

## 2023-02-25 NOTE — Progress Notes (Signed)
   Care Guide Note  02/25/2023 Name: Tami [name] MRN: [id number] DOB: 09/07/1956  Referred by: [name], MD Reason for referral : Care Coordination (Outreach to schedule with Pharm d )   Tami [name] is a 67 y.o. year old female who is a primary care patient of [name], MD. Tami [name] was referred to the pharmacist for assistance related to HTN.    A second unsuccessful telephone outreach was attempted today to contact the patient who was referred to the pharmacy team for assistance with medication management. Additional attempts will be made to contact the patient.  Tami [name], RMA Care Guide [facility]  [place], [place] [address] Direct Dial: [phone] [name]

## 2023-02-25 NOTE — Telephone Encounter (Signed)
Called spoke with pt @ labs

## 2023-02-25 NOTE — Telephone Encounter (Signed)
Pt states she received a call from [place] but did not know what it was regarding. No note shown.

## 2023-02-27 DIAGNOSIS — L237 Allergic contact dermatitis due to plants, except food: Secondary | ICD-10-CM | POA: Diagnosis not present

## 2023-03-01 ENCOUNTER — Encounter: Payer: Self-pay | Admitting: Hematology & Oncology

## 2023-03-01 ENCOUNTER — Other Ambulatory Visit: Payer: Self-pay

## 2023-03-01 ENCOUNTER — Telehealth: Payer: Self-pay

## 2023-03-01 ENCOUNTER — Encounter: Payer: Self-pay | Admitting: Internal Medicine

## 2023-03-01 ENCOUNTER — Ambulatory Visit (INDEPENDENT_AMBULATORY_CARE_PROVIDER_SITE_OTHER): Payer: 59 | Admitting: Internal Medicine

## 2023-03-01 ENCOUNTER — Other Ambulatory Visit (HOSPITAL_COMMUNITY): Payer: Self-pay

## 2023-03-01 VITALS — BP 154/89 | HR 92 | Resp 16 | Ht 66.0 in | Wt 197.0 lb

## 2023-03-01 DIAGNOSIS — A0472 Enterocolitis due to Clostridium difficile, not specified as recurrent: Secondary | ICD-10-CM

## 2023-03-01 LAB — PROTEIN ELECTROPHORESIS, SERUM, WITH REFLEX
A/G Ratio: 0.7 (ref 0.7–1.7)
Albumin ELP: 3.3 g/dL (ref 2.9–4.4)
Alpha-1-Globulin: 0.2 g/dL (ref 0.0–0.4)
Alpha-2-Globulin: 0.9 g/dL (ref 0.4–1.0)
Beta Globulin: 1.1 g/dL (ref 0.7–1.3)
Gamma Globulin: 2.8 g/dL — ABNORMAL HIGH (ref 0.4–1.8)
Globulin, Total: 4.9 g/dL — ABNORMAL HIGH (ref 2.2–3.9)
M-Spike, %: 2.1 g/dL — ABNORMAL HIGH
SPEP Interpretation: 0
Total Protein ELP: 8.2 g/dL (ref 6.0–8.5)

## 2023-03-01 LAB — IMMUNOFIXATION REFLEX, SERUM
IgA: 203 mg/dL (ref 87–352)
IgG (Immunoglobin G), Serum: 3970 mg/dL — ABNORMAL HIGH (ref 586–1602)
IgM (Immunoglobulin M), Srm: 145 mg/dL (ref 26–217)

## 2023-03-01 NOTE — Progress Notes (Signed)
RFV: recurrent c.difficile [name], female   DOB: 1955/12/18, 67 y.o.   MRN: [id number]  HPI Tami [name] is a 66yo with hx of myeloma, and recurrent cdifficile. Just completed orlal vancomycin taper roughly 5 days ago. She had zinplava infusion on 4/09 but stated that she did not notice appreciable improvement, only improved for 2 days then started to have 4 watery stools per day even in the setting of treatment. She required IV fluids to help minimize dehydration the week of 02/24/2023. She is here for follow up to see what else can be done to manage her symptoms.  Outpatient Encounter Medications as of 03/01/2023  Medication Sig   acyclovir (ZOVIRAX) 400 MG tablet TAKE ONE (1) TABLET BY MOUTH TWO (2) TIMES DAILY   albuterol (PROVENTIL) (2.5 MG/3ML) 0.083% nebulizer solution Take 3 mLs (2.5 mg total) by nebulization every 6 (six) hours as needed for wheezing or shortness of breath.   albuterol (VENTOLIN HFA) 108 (90 Base) MCG/ACT inhaler Inhale 2 puffs into the lungs every 6 (six) hours as needed for wheezing or shortness of breath.   aspirin EC 81 MG tablet Take 1 tablet (81 mg total) by mouth daily. (Patient taking differently: Take 81 mg by mouth every evening.)   budesonide (PULMICORT) 0.5 MG/2ML nebulizer solution Take 2 mLs (0.5 mg total) by nebulization 2 (two) times daily as needed (cough, wheezing or shortness of breath).   cetirizine (ZYRTEC) 10 MG tablet Take 10 mg by mouth at bedtime.   Cholecalciferol (VITAMIN D3) 50 MCG (2000 UT) TABS Take 2,000 Units by mouth in the morning.   dicyclomine (BENTYL) 10 MG capsule Take 1 capsule (10 mg total) by mouth 4 (four) times daily -  before meals and at bedtime.   fluticasone (FLONASE) 50 MCG/ACT nasal spray Place 2 sprays into both nostrils daily.   furosemide (LASIX) 20 MG tablet Take 20 mg by mouth daily as needed (fluid retention (feet swelling)).   hyoscyamine (LEVSIN SL) 0.125 MG SL tablet Place 1 tablet (0.125 mg  total) under the tongue every 4 (four) hours as needed.   loperamide (IMODIUM) 2 MG capsule Take 1 capsule (2 mg total) by mouth 4 (four) times daily as needed for diarrhea or loose stools.   losartan (COZAAR) 50 MG tablet Take 50 mg by mouth in the morning and at bedtime.   Magnesium Glycinate 100 MG CAPS Take 400 mg by mouth at bedtime.   meclizine (ANTIVERT) 12.5 MG tablet Take by mouth 2 (two) times daily as needed.   metoprolol succinate (TOPROL-XL) 50 MG 24 hr tablet Take 1 tablet (50 mg total) by mouth 2 (two) times daily.   Multiple Vitamin (QUINTABS) TABS Take 1 tablet by mouth daily.   nortriptyline (PAMELOR) 10 MG capsule Take 1 capsule (10 mg total) by mouth at bedtime.   oxyCODONE-acetaminophen (PERCOCET/ROXICET) 5-325 MG tablet Take by mouth.   potassium chloride SA (KLOR-CON M) 20 MEQ tablet Take 1 tablet (20 mEq total) by mouth daily.   prochlorperazine (COMPAZINE) 10 MG tablet Take 0.5 tablets (5 mg total) by mouth 2 (two) times daily as needed for up to 5 doses for nausea or vomiting.   sucralfate (CARAFATE) 1 g tablet Take 1 tablet (1 g total) by mouth 4 (four) times daily -  with meals and at bedtime.   topiramate (TOPAMAX) 50 MG tablet Take 50 mg by mouth daily.   colestipol (COLESTID) 1 g tablet Take 3-4 tablets (3-4 g total) by mouth every  12 (twelve) hours. (Patient not taking: Reported on 03/01/2023)   meloxicam (MOBIC) 15 MG tablet Take 1 tablet by mouth daily.   nitrofurantoin, macrocrystal-monohydrate, (MACROBID) 100 MG capsule Take 1 capsule (100 mg total) by mouth 2 (two) times daily. (Patient not taking: Reported on 03/01/2023)   Facility-Administered Encounter Medications as of 03/01/2023  Medication   aspirin chewable tablet 162 mg   nitroGLYCERIN (NITROSTAT) SL tablet 0.4 mg     Patient Active Problem List   Diagnosis Date Noted   Asthma 12/20/2022   Irritable bowel syndrome with diarrhea    Nausea and vomiting    Hypocalcemia 08/25/2022   Iron deficiency  anemia due to chronic blood loss 05/28/2022   Chronic diarrhea 05/27/2022   Soft tissue lesion 04/28/2022   Obesity (BMI 30-39.9) 04/28/2022   SBO (small bowel obstruction) 04/27/2022   Dehydration 04/03/2022   Acute vaginitis 03/18/2022   Cervical cancer screening 03/18/2022   Dizziness 12/11/2021   Visual changes 11/26/2021   Ear fullness, bilateral 08/12/2021   Bilateral knee pain 08/08/2021   History of 2019 novel coronavirus disease (COVID-19) 12/06/2020   Vitamin deficiency 07/31/2020   Acute combined systolic and diastolic heart failure 05/09/2020   Hypomagnesemia 03/04/2020   Thrombocytopenia 03/04/2020   Dysphagia 03/04/2020   Constipation 03/04/2020   Sensation of pressure in bladder area 01/17/2020   Hyperglycemia 01/17/2020   Peripheral neuropathy 11/15/2019   Chronic migraine without aura, with intractable migraine, so stated, with status migrainosus 10/17/2019   ETD (Eustachian tube dysfunction), bilateral 09/12/2019   Urinary frequency 07/17/2019   Headache 07/17/2019   Palpitation 05/15/2019   Clostridium difficile diarrhea 09/04/2018   Thyroid nodule 08/26/2018   Multiple myeloma 07/20/2018   Multiple myeloma not having achieved remission 07/20/2018   Hematuria 07/11/2018   Seasonal allergies 06/23/2018   Right shoulder pain 06/07/2018   Neck pain 06/07/2018   Paresthesias 06/07/2018   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   Pain in thoracic spine 03/18/2018   Thoracic back pain 02/15/2018   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   Hypokalemia 08/20/2017   Anemia 08/20/2017   Elevated sed rate 08/15/2017   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/2017   Epigastric abdominal pain 03/22/2017   Myalgia 12/31/2016   Abnormal SPEP 07/26/2016   Low back pain 07/14/2016   Insomnia 12/29/2015   Tension headache 10/21/2015   Muscle spasms of neck 10/18/2015   Dysuria 10/13/2015   AP (abdominal pain) 07/07/2015   Cardiomyopathy (HCC) 04/19/2015   Chest  pain 04/19/2015   Obstructive sleep apnea 03/31/2015   Congestive heart failure 02/24/2015   Arthritis    Hyperlipidemia    Hypertension    Depression    Stroke    GERD (gastroesophageal reflux disease)    Bowel obstruction 11/09/2014     Health Maintenance Due  Topic Date Due   Pneumonia Vaccine 35+ Years old (4 of 4 - PPSV23 or PCV20) 06/02/2022     Review of Systems 12 point ros is negative except for diarrhea -having roughly 4 watery stools per day. Some times has associated nausea. Poor po intake Physical Exam   BP (!) 154/89   Pulse 92   Resp 16   Ht  (1.676 m)   Wt 197 lb (89.4 kg)   SpO2 97%   BMI 31.80 kg/m    Physical Exam  Constitutional:  oriented to person, place, and time. appears well-developed and well-nourished. No distress.  HENT: [place]/AT, PERRLA, no scleral icterus Mouth/Throat: Oropharynx is clear and  moist. No oropharyngeal exudate.  Cardiovascular: Normal rate, regular rhythm and normal heart sounds. Exam reveals no gallop and no friction rub.  No murmur heard.  Pulmonary/Chest: Effort normal and breath sounds normal. No respiratory distress.  has no wheezes.  Neck = supple, no nuchal rigidity Abdominal: Soft. Bowel sounds are normal.  exhibits no distension. There is no tenderness.  Lymphadenopathy: no cervical adenopathy. No axillary adenopathy Neurological: alert and oriented to person, place, and time.  Skin: Skin is warm and dry. No rash noted. No erythema.  Psychiatric: a normal mood and affect.  behavior is normal.   Lab Results  Component Value Date   LABRPR NON REACTIVE 08/23/2020    CBC Lab Results  Component Value Date   WBC 2.4 (L) 02/22/2023   RBC 4.17 02/22/2023   HGB 12.1 02/22/2023   HCT 37.3 02/22/2023   PLT 95 (L) 02/22/2023   MCV 89.4 02/22/2023   MCH 29.0 02/22/2023   MCHC 32.4 02/22/2023   RDW 13.4 02/22/2023   LYMPHSABS 1.2 02/22/2023   MONOABS 0.2 02/22/2023   EOSABS 0.0 02/22/2023    BMET Lab Results   Component Value Date   NA 141 02/22/2023   K 4.0 02/22/2023   CL 108 02/22/2023   CO2 27 02/22/2023   GLUCOSE 93 02/22/2023   BUN 13 02/22/2023   CREATININE 0.87 02/22/2023   CALCIUM 8.9 02/22/2023   GFRNONAA >60 02/22/2023   GFRAA >60 08/10/2020      Assessment and Plan Recalcitrant c.difficile = will check again for cdifficile testing of her stool. If positive, plan to repeat oral vancomycin treatment in addition to getting insurance approval for oral FMT.(Vowst). 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 Discussed plan with patient and her family member/friend, patient advocate.  See back in 4 wk.   I have personally spent 30 minutes involved in face-to-face and non-face-to-face activities for this patient on the day of the visit. Professional time spent includes the following activities: Preparing to see the patient (review of tests), Obtaining and/or reviewing separately obtained history (admission/discharge record), Performing a medically appropriate examination and/or evaluation , Ordering medications/tests/procedures, referring and communicating with other health care professionals, Documenting clinical information in the EMR, Independently interpreting results (not separately reported), Communicating results to the patient/family/caregiver, Counseling and educating the patient/family/caregiver and Care coordination (not separately reported).

## 2023-03-01 NOTE — Telephone Encounter (Signed)
[facility] Patient Advocate Encounter   Received notification from [employer] that prior authorization for Vowst is required.   PA submitted on 03/01/23 Key [id number] Status is pending    [facility] will continue to follow.   Clearance Coots, CPhT Specialty Pharmacy Patient [name] Phone: [phone] Fax:  [phone]

## 2023-03-01 NOTE — Telephone Encounter (Signed)
[facility] Patient Advocate Encounter  Prior Authorization for Vowst has been approved.    PA# [id number] Effective dates: 03/01/23 through 03/15/23  Patients co-pay is $0.00.   [facility] will continue to follow.  Clearance Coots, CPhT Specialty Pharmacy Patient [facility] for Infectious Disease Phone: [phone] Fax:  [phone]

## 2023-03-03 ENCOUNTER — Telehealth: Payer: Self-pay | Admitting: Family Medicine

## 2023-03-03 ENCOUNTER — Other Ambulatory Visit: Payer: Self-pay | Admitting: Medical

## 2023-03-03 ENCOUNTER — Telehealth: Payer: Self-pay | Admitting: Pharmacist

## 2023-03-03 ENCOUNTER — Other Ambulatory Visit: Payer: Self-pay | Admitting: Family Medicine

## 2023-03-03 ENCOUNTER — Ambulatory Visit (INDEPENDENT_AMBULATORY_CARE_PROVIDER_SITE_OTHER): Payer: 59 | Admitting: Pharmacist

## 2023-03-03 DIAGNOSIS — K529 Noninfective gastroenteritis and colitis, unspecified: Secondary | ICD-10-CM

## 2023-03-03 DIAGNOSIS — I10 Essential (primary) hypertension: Secondary | ICD-10-CM

## 2023-03-03 MED ORDER — HYDROXYZINE HCL 10 MG PO TABS: ORAL_TABLET | ORAL | 2 refills | Status: AC

## 2023-03-03 NOTE — Telephone Encounter (Signed)
Unsuccessful patient outreach. Patient has phone appt with Mrs. Lafferty to discuss medications and check for any potential medication related cause for diarrhea.  Unable to reach patient. Will have scheduler try to reschedule patient.  I did review her med list and recent office visits.   Looks like patient was been approved for Vowst to treat c diff from ID team.    Patient called back while completing this note. See phone visit notes.

## 2023-03-03 NOTE — Telephone Encounter (Signed)
Pt called stating had gone to Urgent care due to poison ivy, pt states was given meds but from her last year visit pt mentioned that this situation has happened to her before (last year) and was given a rx for Hydroxyzine of 10 mg 1 tablet by mouth 3 times a day or as needed for itchiness. Pt would like to know if provider could give her this rx since she is having a lot of itchiness. Pt was informed that she is more positively needing an appt in our office, pt insisted to send message to provider first. If rx is sent pt would like it to be sent to  [facility] - [place], [place] - [address], [place] [address] Phone: [phone]  Fax: [phone]  Please advise

## 2023-03-03 NOTE — Progress Notes (Signed)
03/03/2023 Name: Tami [name] MRN: [id number] DOB: Dec 17, 1955  Chief Complaint  Patient presents with   Medication Management   Hypertension    Tami [name] is a 67 y.o. year old female who presented for a telephone visit.   They were referred to the pharmacist by their PCP for assistance in managing hypertension and reviewed medication list for possible medication contributing to chronic diarrhea.  .   Subjective:  Care Team: Primary Care Provider: [name], MD ; Next Scheduled Visit: 04/27/2023 Infectious Disease: Dr Drue Second - next appt 03/29/2023 Pulmonology: Ames Dura - next appt 05/03/2023 Hematology / Oncology: Dr Myna Hidalgo - next appt 03/24/2023  Medication Access/Adherence  Current Pharmacy:  [facility] [place], [place] - [address] [place] [address] Phone: [phone] Fax: [phone]  [facility], [place] - [address] [address] [place] [address] Phone: [phone] Fax: [phone]  [facility] [place] - [facility] [address] [place] [address] Phone: [phone] Fax: [phone]  [id number] - [place], [place] - [address] [place] [address] Phone: [phone] Fax: [phone]   Patient reports affordability concerns with their medications: No  Patient reports access/transportation concerns to their pharmacy: No  Patient reports adherence concerns with their medications:  No     Hypertension:  Current medications: losartan 50mg  twice a day, metoprolol succinate ER 50mg  twice a day, spironolactone 25mg  - take 0.5 tablet = 12.5mg  daily, furosemide 20mg  as needed,    Patient has a validated, automated, upper arm home BP cuff Current blood pressure readings readings: 140 to 153 / 84 to 91  Patient denies hypotensive s/sx including no dizziness, lightheadedness.  Patient  denies hypertensive symptoms including no headache, chest pain, shortness of breath  Chronic Diarrhea:  Patient has just completed treatment with vancomycin for c.diff infections. She will provide stool sample for infectious disease department today or tomorrow. If c.diff is still noted in her stool then the plan to is retreat with vancomycin and also with Vowst (a microbiome of good bacteria).   Tami [name] reports that when she first completed vancomycin, loose stool stopped but reports she is having loose stools the last few days. She did complete a round of nitrofurantoin recently for UTI which is associated with c.diff infection.  She is also taking loperamide 2mg  tablets - 2 tabs twice a day  She also reports sensitivity to milk products - they also cause diarrhea.   Objective:  Lab Results  Component Value Date   HGBA1C 5.8 04/21/2022    Lab Results  Component Value Date   CREATININE 0.87 02/22/2023   BUN 13 02/22/2023   NA 141 02/22/2023   K 4.0 02/22/2023   CL 108 02/22/2023   CO2 27 02/22/2023    Lab Results  Component Value Date   CHOL 171 04/21/2022   HDL 49.40 04/21/2022   LDLCALC 100 (H) 04/21/2022   TRIG 105.0 04/21/2022   CHOLHDL 3 04/21/2022    Medications Reviewed Today     Reviewed by Henrene Pastor, RPH-CPP (Pharmacist) on 03/05/23 at 0813  Med List Status: <None>   Medication Order Taking? Sig Documenting Provider Last Dose Status Informant  acyclovir (ZOVIRAX) 400 MG tablet [id number] Yes TAKE ONE (1) TABLET BY MOUTH TWO (2) TIMES DAILY Ennever, Rose Phi, MD Taking Active  Med Note Clydie Braun, Ardean Simonich B   Wed Mar 03, 2023  2:34 PM) Takes as needed  albuterol (PROVENTIL) (2.5 MG/3ML) 0.083% nebulizer solution [id number] Yes Take 3 mLs (2.5 mg total) by nebulization every 6 (six) hours as needed for wheezing or shortness of breath. [name], MD Taking Active   albuterol (VENTOLIN HFA) 108 (90 Base) MCG/ACT inhaler [id number] Yes Inhale 2 puffs  into the lungs every 6 (six) hours as needed for wheezing or shortness of breath. [name], MD Taking Active   aspirin chewable tablet 162 mg [id number]   [name], MD  Consider Medication Status and Discontinue (Patient Preference)   aspirin EC 81 MG tablet [id number] Yes Take 1 tablet (81 mg total) by mouth daily.  Patient taking differently: Take 81 mg by mouth every evening.   [name], MD Taking Active Self  aspirin EC 81 MG tablet [id number] Yes Take 81 mg by mouth daily. Swallow whole. [provider]  Active   budesonide (PULMICORT) 0.5 MG/2ML nebulizer solution [id number] Yes Take 2 mLs (0.5 mg total) by nebulization 2 (two) times daily as needed (cough, wheezing or shortness of breath). Martina Sinner, MD Taking Active   cetirizine (ZYRTEC) 10 MG tablet [id number] Yes Take 10 mg by mouth at bedtime. [provider] Taking Active Self  Cholecalciferol (VITAMIN D3) 50 MCG (2000 UT) TABS [id number] Yes Take 2,000 Units by mouth in the morning. [provider] Taking Active Self  dicyclomine (BENTYL) 10 MG capsule [id number] Yes Take 1 capsule (10 mg total) by mouth 4 (four) times daily -  before meals and at bedtime. Judyann Munson, MD Taking Active   fluticasone [facility]) 50 MCG/ACT nasal spray [id number] No Place 2 sprays into both nostrils daily.  Patient not taking: Reported on 03/03/2023   Joy, Shawn C, PA-C Not Taking Active Self  furosemide (LASIX) 20 MG tablet [id number] Yes Take 20 mg by mouth daily as needed (fluid retention (feet swelling)). [provider] Taking Active Self  hydrOXYzine (ATARAX) 10 MG tablet [id number]  TAKE ONE (1) TABLET BY MOUTH 3 TIMES DAILY AS NEEDED FOR Elwin Sleight, MD  Active   hyoscyamine (LEVSIN SL) 0.125 MG SL tablet [id number] No Place 1 tablet (0.125 mg total) under the tongue every 4 (four) hours as needed.  Patient not taking: Reported on 03/03/2023   [name], MD Not Taking Active  Self  loperamide (IMODIUM) 2 MG capsule [id number] Yes Take 4 mg by mouth in the morning and at bedtime. [provider] Taking Active   losartan (COZAAR) 50 MG tablet [id number] Yes Take 50 mg by mouth in the morning and at bedtime. [provider] Taking Active Self  Magnesium Glycinate 100 MG CAPS [id number] Yes Take 200 mg by mouth at bedtime. [provider] Taking Active   meclizine (ANTIVERT) 12.5 MG tablet [id number]  Take by mouth 2 (two) times daily as needed. [provider]  Active   meloxicam (MOBIC) 15 MG tablet [id number] No Take 1 tablet by mouth daily.  Patient not taking: Reported on 03/03/2023   [provider] Not Taking Active   metoprolol succinate (TOPROL-XL) 50 MG 24 hr tablet [id number] Yes Take 1 tablet (50 mg total) by mouth 2 (two) times daily. [name], MD Taking Active   Multiple Vitamin Brandt Loosen) TABS [id number] Yes Take 1 tablet by mouth daily. [provider] Taking Active   nitrofurantoin, macrocrystal-monohydrate, (MACROBID) 100 MG capsule [id number] Yes Take  1 capsule (100 mg total) by mouth 2 (two) times daily. [name], MD Taking Active   nitroGLYCERIN (NITROSTAT) SL tablet 0.4 mg [id number]   [name], MD  Active   nortriptyline (PAMELOR) 10 MG capsule [id number] Yes Take 1 capsule (10 mg total) by mouth at bedtime.  Patient taking differently: Take 10 mg by mouth at bedtime. As needed   [name], MD Taking Active   oxyCODONE-acetaminophen (PERCOCET/ROXICET) 5-325 MG tablet [id number] No Take by mouth.  Patient not taking: Reported on 03/03/2023   [provider] Not Taking Active   potassium chloride SA (KLOR-CON M) 20 MEQ tablet [id number] Yes Take 1 tablet (20 mEq total) by mouth daily. [name], MD Taking Active   prochlorperazine (COMPAZINE) 10 MG tablet [id number] Yes Take 0.5 tablets (5 mg total) by mouth 2 (two) times daily as needed for up to 5 doses for nausea or  vomiting. Virgina Norfolk, DO Taking Active   spironolactone (ALDACTONE) 25 MG tablet [id number] Yes Take 12.5 mg by mouth daily. [provider] Taking Active               Assessment/Plan:   Hypertension:Currently uncontrolled - blood pressure goal < 140/ 90 - Reviewed long term cardiovascular and renal outcomes of uncontrolled blood pressure - Reviewed appropriate blood pressure monitoring technique and reviewed goal blood pressure. Provided patient with handout about proper blood pressure monitoring technique.  - Recommended to check home blood pressure and heart rate daily and provided blood pressure recorder - Continue current medications - patient is due to have blood pressure checked in office next week by nurse.  - Discussed limiting sodium in take - provided handout about low sodium diet.    Chronic Diarrhea:  - Reviewed medications for potential medications that could be contributing to diarrhea.   Potassium chloride can cause diarrhea - patient was advised to make sure she takes with food.   Acyclovir - has a 2.4 to 3.2% incidence of causing diarrhea - patient takes as needed. Reminded to take with food.   Losartan - 4% report of diarrhea.   Magnesium glycinate - most magnesium products could cause GI side effects, though the preparation she is using has been know to be less likely to cause GI problems.   Recommended she try holding magnesium glycinate a few days to see if diarrhea improved.   Patient also asked about supplements like Ensure. I provided her with a few samples for Ensure Complete (this preparation is OK to use in patients with lactose intolerance)  Samples for Ensure and handouts were left at front desk for patient to pick up    Follow Up Plan: 2 weeks to check blood pressure and diarrhea.  Dannial Monarch

## 2023-03-04 NOTE — Telephone Encounter (Signed)
Called pt was advised medication  had been sent. Pt stated she got Rx

## 2023-03-05 ENCOUNTER — Emergency Department (HOSPITAL_BASED_OUTPATIENT_CLINIC_OR_DEPARTMENT_OTHER)
Admission: EM | Admit: 2023-03-05 | Discharge: 2023-03-05 | Disposition: A | Discharge status: Home / Self Care | Payer: 59 | Attending: Emergency Medicine | Admitting: Emergency Medicine

## 2023-03-05 ENCOUNTER — Encounter (HOSPITAL_BASED_OUTPATIENT_CLINIC_OR_DEPARTMENT_OTHER): Payer: Self-pay | Admitting: *Deleted

## 2023-03-05 ENCOUNTER — Other Ambulatory Visit: Payer: Self-pay

## 2023-03-05 DIAGNOSIS — R197 Diarrhea, unspecified: Secondary | ICD-10-CM | POA: Diagnosis not present

## 2023-03-05 DIAGNOSIS — Z7982 Long term (current) use of aspirin: Secondary | ICD-10-CM | POA: Insufficient documentation

## 2023-03-05 LAB — CBC
HCT: 36.2 % (ref 36.0–46.0)
Hemoglobin: 11.7 g/dL — ABNORMAL LOW (ref 12.0–15.0)
MCH: 28.9 pg (ref 26.0–34.0)
MCHC: 32.3 g/dL (ref 30.0–36.0)
MCV: 89.4 fL (ref 80.0–100.0)
Platelets: 152 10*3/uL (ref 150–400)
RBC: 4.05 MIL/uL (ref 3.87–5.11)
RDW: 13.8 % (ref 11.5–15.5)
WBC: 4.4 10*3/uL (ref 4.0–10.5)
nRBC: 0 % (ref 0.0–0.2)

## 2023-03-05 LAB — URINALYSIS, ROUTINE W REFLEX MICROSCOPIC
Bilirubin Urine: NEGATIVE
Glucose, UA: NEGATIVE mg/dL
Ketones, ur: NEGATIVE mg/dL
Nitrite: NEGATIVE
Protein, ur: 30 mg/dL — AB
Specific Gravity, Urine: >=1.03 (ref 1.005–1.030)
pH: 6 (ref 5.0–8.0)

## 2023-03-05 LAB — URINALYSIS, MICROSCOPIC (REFLEX)

## 2023-03-05 LAB — COMPREHENSIVE METABOLIC PANEL
ALT: 38 U/L (ref 0–44)
AST: 57 U/L — ABNORMAL HIGH (ref 15–41)
Albumin: 3.3 g/dL — ABNORMAL LOW (ref 3.5–5.0)
Alkaline Phosphatase: 51 U/L (ref 38–126)
Anion gap: 7 (ref 5–15)
BUN: 26 mg/dL — ABNORMAL HIGH (ref 8–23)
CO2: 24 mmol/L (ref 22–32)
Calcium: 8.4 mg/dL — ABNORMAL LOW (ref 8.9–10.3)
Chloride: 105 mmol/L (ref 98–111)
Creatinine, Ser: 0.74 mg/dL (ref 0.44–1.00)
GFR, Estimated: >60 mL/min (ref >60)
Glucose, Bld: 114 mg/dL — ABNORMAL HIGH (ref 70–99)
Potassium: 3.9 mmol/L (ref 3.5–5.1)
Sodium: 136 mmol/L (ref 135–145)
Total Bilirubin: 0.4 mg/dL (ref 0.3–1.2)
Total Protein: 9.2 g/dL — ABNORMAL HIGH (ref 6.5–8.1)

## 2023-03-05 LAB — LIPASE, BLOOD: Lipase: 56 U/L — ABNORMAL HIGH (ref 11–51)

## 2023-03-05 MED ORDER — SODIUM CHLORIDE 0.9 % IV BOLUS
1000.0000 mL | Freq: Once | INTRAVENOUS | Status: AC
  Administered 2023-03-05: 1000 mL via INTRAVENOUS

## 2023-03-05 NOTE — ED Provider Notes (Signed)
[place] [facility] AT [facility] Provider Note   CSN: [id number] Arrival date & time: 03/05/23  1936     History  Chief Complaint  Patient presents with   Dehydration    Tami [name] is a 67 y.o. female presented to ED with complaint of generalized weakness and dehydration.  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  She has about 4-5 loose stools a day.  She has had apically she goes to get "IV fluids" at her doctor's office but he was fully booked today and advised to come to the ER.  She reports he has a same sense of feeling dehydrated.  She is drinking a lot of fluid at home.  HPI     Home Medications Prior to Admission medications   Medication Sig Start Date End Date Taking? Authorizing Provider  acyclovir (ZOVIRAX) 400 MG tablet TAKE ONE (1) TABLET BY MOUTH TWO (2) TIMES DAILY 08/29/22   Josph Macho, MD  albuterol (PROVENTIL) (2.5 MG/3ML) 0.083% nebulizer solution Take 3 mLs (2.5 mg total) by nebulization every 6 (six) hours as needed for wheezing or shortness of breath. 12/15/22   [name], MD  albuterol (VENTOLIN HFA) 108 (90 Base) MCG/ACT inhaler Inhale 2 puffs into the lungs every 6 (six) hours as needed for wheezing or shortness of breath. 12/07/22   [name], MD  aspirin EC 81 MG tablet Take 1 tablet (81 mg total) by mouth daily. Patient taking differently: Take 81 mg by mouth every evening. 02/15/18   [name], MD  aspirin EC 81 MG tablet Take 81 mg by mouth daily. Swallow whole.    [provider]  budesonide (PULMICORT) 0.5 MG/2ML nebulizer solution Take 2 mLs (0.5 mg total) by nebulization 2 (two) times daily as needed (cough, wheezing or shortness of breath). 12/31/22   Martina Sinner, MD  cetirizine (ZYRTEC) 10 MG tablet Take 10 mg by mouth at bedtime.    [provider]   Cholecalciferol (VITAMIN D3) 50 MCG (2000 UT) TABS Take 2,000 Units by mouth in the morning.    [provider]  dicyclomine (BENTYL) 10 MG capsule Take 1 capsule (10 mg total) by mouth 4 (four) times daily -  before meals and at bedtime. 01/25/23 04/25/23  Judyann Munson, MD  fluticasone (FLONASE) 50 MCG/ACT nasal spray Place 2 sprays into both nostrils daily. Patient not taking: Reported on 03/03/2023 01/07/20   Harolyn Rutherford C, PA-C  furosemide (LASIX) 20 MG tablet Take 20 mg by mouth daily as needed (fluid retention (feet swelling)). 07/03/20   [provider]  hydrOXYzine (ATARAX) 10 MG tablet TAKE ONE (1) TABLET BY MOUTH 3 TIMES DAILY AS NEEDED FOR ITCHING 03/03/23   [name], MD  hyoscyamine (LEVSIN SL) 0.125 MG SL tablet Place 1 tablet (0.125 mg total) under the tongue every 4 (four) hours as needed. Patient not taking: Reported on 03/03/2023 05/21/22   [name], MD  loperamide (IMODIUM) 2 MG capsule Take 4 mg by mouth in the morning and at bedtime.    [provider]  losartan (COZAAR) 50 MG tablet Take 50 mg by mouth in the morning and at bedtime. 04/14/21   [provider]  Magnesium Glycinate 100 MG CAPS Take 200 mg by mouth at bedtime. 09/21/22   [provider]  meclizine (ANTIVERT) 12.5  MG tablet Take by mouth 2 (two) times daily as needed. 01/28/22   [provider]  meloxicam (MOBIC) 15 MG tablet Take 1 tablet by mouth daily. Patient not taking: Reported on 03/03/2023 12/31/22   [provider]  metoprolol succinate (TOPROL-XL) 50 MG 24 hr tablet Take 1 tablet (50 mg total) by mouth 2 (two) times daily. 02/23/23   [name], MD  Multiple Vitamin Brandt Loosen) TABS Take 1 tablet by mouth daily. 11/06/21   [provider]  nitrofurantoin, macrocrystal-monohydrate, (MACROBID) 100 MG capsule Take 1 capsule (100 mg total) by mouth 2 (two) times daily. 02/25/23   [name], MD  nortriptyline (PAMELOR) 10 MG  capsule Take 1 capsule (10 mg total) by mouth at bedtime. Patient taking differently: Take 10 mg by mouth at bedtime. As needed 01/12/23   [name], MD  oxyCODONE-acetaminophen (PERCOCET/ROXICET) 5-325 MG tablet Take by mouth. Patient not taking: Reported on 03/03/2023 11/07/21   [provider]  potassium chloride SA (KLOR-CON M) 20 MEQ tablet Take 1 tablet (20 mEq total) by mouth daily. 01/15/23   [name], MD  prochlorperazine (COMPAZINE) 10 MG tablet Take 0.5 tablets (5 mg total) by mouth 2 (two) times daily as needed for up to 5 doses for nausea or vomiting. 01/27/23   Curatolo, Adam, DO  spironolactone (ALDACTONE) 25 MG tablet Take 12.5 mg by mouth daily.    [provider]      Allergies    Benazepril, Ondansetron hcl, Codeine, and Morphine    Review of Systems   Review of Systems  Physical Exam Updated Vital Signs BP (!) 148/93 (BP Location: Right Arm)   Pulse 87   Temp 98 F (36.7 C) (Oral)   Resp 20   SpO2 97%  Physical Exam Constitutional:      General: She is not in acute distress. HENT:     Head: Normocephalic and atraumatic.  Eyes:     Conjunctiva/sclera: Conjunctivae normal.     Pupils: Pupils are equal, round, and reactive to light.  Cardiovascular:     Rate and Rhythm: Normal rate and regular rhythm.  Pulmonary:     Effort: Pulmonary effort is normal. No respiratory distress.  Abdominal:     General: There is no distension.     Tenderness: There is no abdominal tenderness.  Skin:    General: Skin is warm and dry.  Neurological:     General: No focal deficit present.     Mental Status: She is alert and oriented to person, place, and time. Mental status is at baseline.  Psychiatric:        Mood and Affect: Mood normal.        Behavior: Behavior normal.     ED Results / Procedures / Treatments   Labs (all labs ordered are listed, but only abnormal results are displayed) Labs Reviewed  LIPASE, BLOOD - Abnormal; Notable for  the following components:      Result Value   Lipase 56 (*)    All other components within normal limits  COMPREHENSIVE METABOLIC PANEL - Abnormal; Notable for the following components:   Glucose, Bld 114 (*)    BUN 26 (*)    Calcium 8.4 (*)    Total Protein 9.2 (*)    Albumin 3.3 (*)    AST 57 (*)    All other components within normal limits  CBC - Abnormal; Notable for the following components:   Hemoglobin 11.7 (*)  All other components within normal limits  URINALYSIS, ROUTINE W REFLEX MICROSCOPIC - Abnormal; Notable for the following components:   Hgb urine dipstick TRACE (*)    Protein, ur 30 (*)    Leukocytes,Ua SMALL (*)    All other components within normal limits  URINALYSIS, MICROSCOPIC (REFLEX) - Abnormal; Notable for the following components:   Bacteria, UA RARE (*)    All other components within normal limits    EKG None  Radiology No results found.  Procedures Procedures    Medications Ordered in ED Medications  sodium chloride 0.9 % bolus 1,000 mL (has no administration in time range)    ED Course/ Medical Decision Making/ A&P                             Medical Decision Making Amount and/or Complexity of Data Reviewed Labs: ordered.   Patient is here with acute on chronic diarrhea, ongoing for several months.  She is mildly hypertensive but otherwise clinically well-appearing on exam.  I personally reviewed and interpreted the patient's labs, no concerns for significant dehydration, specifically no elevated creatinine to suggest an acute kidney injury, electrolytes largely within normal limits.  I do not see an indication for emergent CT imaging of the abdomen.  Low suspicion for active colitis.  White blood cell count within normal limits.  She can says she completed vancomycin course about 2 weeks ago for C. difficile, and I do not suspect this is ongoing C. difficile at this point.  Patient is requesting simply a liter of IV fluids which she  gets routinely done and says she feels better afterwards.  I have ordered the IV fluids here in the ED.  After that the patient could be discharged home, and she is quite comfortable with this plan.  Okay for discharge        Final Clinical Impression(s) / ED Diagnoses Final diagnoses:  None    Rx / DC Orders ED Discharge Orders     None         Ketina Mars, Kermit Balo, MD 03/05/23 2052

## 2023-03-05 NOTE — ED Triage Notes (Signed)
Pt reports that she is here for IV hydration.  Pt states that she has had diarrhea since last Friday.  Pt has hx of same and has had to have fluids in the past for this. Pt reports 4-5 loose stools daily.

## 2023-03-08 ENCOUNTER — Telehealth: Payer: Self-pay | Admitting: Family Medicine

## 2023-03-08 ENCOUNTER — Other Ambulatory Visit: Payer: Self-pay

## 2023-03-08 ENCOUNTER — Other Ambulatory Visit: Payer: 59

## 2023-03-08 DIAGNOSIS — A0472 Enterocolitis due to Clostridium difficile, not specified as recurrent: Secondary | ICD-10-CM

## 2023-03-08 NOTE — Progress Notes (Deleted)
Pt here for Blood pressure check per - 03/03/23: "Continue current medications - patient is due to have blood pressure checked in office next week by nurse. Henrene Pastor, RPH-CPP"  Pt currently takes: Lasix 20 mg daily Losartan 50 mg BID Metoprolol Succinate 50 mg BID Spironalactone 25 mg (historical provider)?  Pt reports compliance with medication.  BP today @ = HR =  Pt advised per

## 2023-03-08 NOTE — Telephone Encounter (Signed)
Contacted Tami [name] to schedule their annual wellness visit. Appointment made for 03/26/2023.  Tami [name]; Care Guide Ambulatory Clinical Support [place] [facility] Direct Dial: [phone]

## 2023-03-09 ENCOUNTER — Ambulatory Visit: Payer: 59

## 2023-03-09 DIAGNOSIS — Z8619 Personal history of other infectious and parasitic diseases: Secondary | ICD-10-CM | POA: Diagnosis not present

## 2023-03-09 DIAGNOSIS — R5383 Other fatigue: Secondary | ICD-10-CM | POA: Diagnosis not present

## 2023-03-09 DIAGNOSIS — R109 Unspecified abdominal pain: Secondary | ICD-10-CM | POA: Diagnosis not present

## 2023-03-09 DIAGNOSIS — I10 Essential (primary) hypertension: Secondary | ICD-10-CM

## 2023-03-09 DIAGNOSIS — E559 Vitamin D deficiency, unspecified: Secondary | ICD-10-CM | POA: Diagnosis not present

## 2023-03-09 DIAGNOSIS — Z79899 Other long term (current) drug therapy: Secondary | ICD-10-CM | POA: Diagnosis not present

## 2023-03-09 DIAGNOSIS — D72819 Decreased white blood cell count, unspecified: Secondary | ICD-10-CM | POA: Diagnosis not present

## 2023-03-09 DIAGNOSIS — C9001 Multiple myeloma in remission: Secondary | ICD-10-CM | POA: Diagnosis not present

## 2023-03-09 DIAGNOSIS — R0602 Shortness of breath: Secondary | ICD-10-CM | POA: Diagnosis not present

## 2023-03-09 DIAGNOSIS — R300 Dysuria: Secondary | ICD-10-CM | POA: Diagnosis not present

## 2023-03-09 DIAGNOSIS — R197 Diarrhea, unspecified: Secondary | ICD-10-CM | POA: Diagnosis not present

## 2023-03-09 DIAGNOSIS — N183 Chronic kidney disease, stage 3 unspecified: Secondary | ICD-10-CM | POA: Diagnosis not present

## 2023-03-09 DIAGNOSIS — M545 Low back pain, unspecified: Secondary | ICD-10-CM | POA: Diagnosis not present

## 2023-03-09 DIAGNOSIS — E785 Hyperlipidemia, unspecified: Secondary | ICD-10-CM | POA: Diagnosis not present

## 2023-03-09 DIAGNOSIS — G8929 Other chronic pain: Secondary | ICD-10-CM | POA: Diagnosis not present

## 2023-03-09 DIAGNOSIS — I517 Cardiomegaly: Secondary | ICD-10-CM | POA: Diagnosis not present

## 2023-03-09 DIAGNOSIS — E611 Iron deficiency: Secondary | ICD-10-CM | POA: Diagnosis not present

## 2023-03-09 LAB — C. DIFFICILE GDH AND TOXIN A/B
GDH ANTIGEN: NOT DETECTED
MICRO NUMBER:: 14890818
SPECIMEN QUALITY:: ADEQUATE
TOXIN A AND B: NOT DETECTED

## 2023-03-10 ENCOUNTER — Telehealth: Payer: Self-pay | Admitting: Family Medicine

## 2023-03-10 ENCOUNTER — Telehealth: Payer: Self-pay | Admitting: Internal Medicine

## 2023-03-10 DIAGNOSIS — G4733 Obstructive sleep apnea (adult) (pediatric): Secondary | ICD-10-CM | POA: Diagnosis not present

## 2023-03-10 NOTE — Telephone Encounter (Signed)
Tami [name] called hoping to receive lab results and express some concerns with Dr. Drue Second. She stated she was at the ER yesterday morning for dehydration and stomach pain. She was given fluids but would like to explore different options with Dr. Drue Second for relief. She is still having 4-5 bowel movements per day and feeling weak. She also expressed concerns of an enlarged heart and if the symptoms are related. She sees her cardiologist Tuesday morning. Saory requested a call to discuss these concerns and go over lab results.

## 2023-03-10 NOTE — Telephone Encounter (Signed)
Pt called to advise that she received her results from her stool sample and she does not have Cdiff. Dr. Drue Second thinks she has IBS and she was told to follow up with Dr. Abner Greenspan  to discuss treatment of this. Pt said she is still having diarrhea. She does not want to see someone else in the office and wants to know if someone can work her in to Dr. Mariel Aloe schedule. Please call to advise

## 2023-03-10 NOTE — Telephone Encounter (Signed)
Per Dr. Drue Second: "can you let her know that her cdiff testing is negative. which is great news.   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 can take immodium 2-3 x a day to see if it improves output. "  Relayed this to Brihany, she is happy to know she does not have C.diff, but has been taking imodium every day without relief. She would like to know what next steps are as Dr. Drue Second mentioned something about a pill. We discussed that this was probably in the case that her C.diff testing returned positive, which it did not.   Recommended she also reach out to PCP for further recommendations as well.   Sandie Ano, RN

## 2023-03-11 DIAGNOSIS — Z79899 Other long term (current) drug therapy: Secondary | ICD-10-CM | POA: Diagnosis not present

## 2023-03-12 NOTE — Telephone Encounter (Signed)
Called pt spoke with her about appt  Made for 03/16/23 at 2.

## 2023-03-15 ENCOUNTER — Encounter (HOSPITAL_BASED_OUTPATIENT_CLINIC_OR_DEPARTMENT_OTHER): Payer: Self-pay

## 2023-03-15 ENCOUNTER — Emergency Department (HOSPITAL_BASED_OUTPATIENT_CLINIC_OR_DEPARTMENT_OTHER)
Admission: EM | Admit: 2023-03-15 | Discharge: 2023-03-15 | Disposition: A | Discharge status: Home / Self Care | Payer: 59 | Attending: Emergency Medicine | Admitting: Emergency Medicine

## 2023-03-15 ENCOUNTER — Telehealth: Payer: 59

## 2023-03-15 ENCOUNTER — Other Ambulatory Visit: Payer: Self-pay

## 2023-03-15 DIAGNOSIS — R197 Diarrhea, unspecified: Secondary | ICD-10-CM | POA: Insufficient documentation

## 2023-03-15 DIAGNOSIS — Z7982 Long term (current) use of aspirin: Secondary | ICD-10-CM | POA: Insufficient documentation

## 2023-03-15 DIAGNOSIS — K529 Noninfective gastroenteritis and colitis, unspecified: Secondary | ICD-10-CM

## 2023-03-15 LAB — CBC WITH DIFFERENTIAL/PLATELET
Abs Immature Granulocytes: 0.01 10*3/uL (ref 0.00–0.07)
Basophils Absolute: 0 10*3/uL (ref 0.0–0.1)
Basophils Relative: 2 %
Eosinophils Absolute: 0 10*3/uL (ref 0.0–0.5)
Eosinophils Relative: 2 %
HCT: 35.9 % — ABNORMAL LOW (ref 36.0–46.0)
Hemoglobin: 11.4 g/dL — ABNORMAL LOW (ref 12.0–15.0)
Immature Granulocytes: 1 %
Lymphocytes Relative: 35 %
Lymphs Abs: 0.7 10*3/uL (ref 0.7–4.0)
MCH: 28.4 pg (ref 26.0–34.0)
MCHC: 31.8 g/dL (ref 30.0–36.0)
MCV: 89.3 fL (ref 80.0–100.0)
Monocytes Absolute: 0.2 10*3/uL (ref 0.1–1.0)
Monocytes Relative: 9 %
Neutro Abs: 1 10*3/uL — ABNORMAL LOW (ref 1.7–7.7)
Neutrophils Relative %: 51 %
Platelets: 104 10*3/uL — ABNORMAL LOW (ref 150–400)
RBC: 4.02 MIL/uL (ref 3.87–5.11)
RDW: 13.6 % (ref 11.5–15.5)
WBC: 2 10*3/uL — ABNORMAL LOW (ref 4.0–10.5)
nRBC: 0 % (ref 0.0–0.2)

## 2023-03-15 LAB — LIPASE, BLOOD: Lipase: 36 U/L (ref 11–51)

## 2023-03-15 LAB — COMPREHENSIVE METABOLIC PANEL
ALT: 18 U/L (ref 0–44)
AST: 29 U/L (ref 15–41)
Albumin: 3.2 g/dL — ABNORMAL LOW (ref 3.5–5.0)
Alkaline Phosphatase: 46 U/L (ref 38–126)
Anion gap: 5 (ref 5–15)
BUN: 15 mg/dL (ref 8–23)
CO2: 25 mmol/L (ref 22–32)
Calcium: 8.5 mg/dL — ABNORMAL LOW (ref 8.9–10.3)
Chloride: 107 mmol/L (ref 98–111)
Creatinine, Ser: 0.73 mg/dL (ref 0.44–1.00)
GFR, Estimated: >60 mL/min (ref >60)
Glucose, Bld: 114 mg/dL — ABNORMAL HIGH (ref 70–99)
Potassium: 3.7 mmol/L (ref 3.5–5.1)
Sodium: 137 mmol/L (ref 135–145)
Total Bilirubin: 0.5 mg/dL (ref 0.3–1.2)
Total Protein: 9 g/dL — ABNORMAL HIGH (ref 6.5–8.1)

## 2023-03-15 LAB — MAGNESIUM: Magnesium: 1.7 mg/dL (ref 1.7–2.4)

## 2023-03-15 MED ORDER — SODIUM CHLORIDE 0.9 % IV BOLUS
1000.0000 mL | Freq: Once | INTRAVENOUS | Status: AC
  Administered 2023-03-15: 1000 mL via INTRAVENOUS

## 2023-03-15 MED ORDER — CHOLESTYRAMINE 4 G PO PACK: 4.0000 g | PACK | Freq: Three times a day (TID) | ORAL | 0 refills | Status: DC

## 2023-03-15 MED ORDER — FENTANYL CITRATE PF 50 MCG/ML IJ SOSY: 25.0000 ug | PREFILLED_SYRINGE | Freq: Once | INTRAMUSCULAR | Status: DC

## 2023-03-15 NOTE — Discharge Instructions (Signed)
Your lab work looked good.  Please follow-up with your family doctor in the office.  I am going to try medication that sometimes helps if this is due to your gallbladder being removed.

## 2023-03-15 NOTE — ED Triage Notes (Addendum)
States has had diarrhea since last week, feels increased weakness. Seen here multiple times for same.  Also states her "heart has been feeling funny"

## 2023-03-15 NOTE — ED Notes (Addendum)
Reviewed discharge instructions and recommendations with pt. Pt states understanding. Pt ambulatory at discharge  Pt to take BP upon arrival home

## 2023-03-15 NOTE — ED Provider Notes (Signed)
[place] [facility] Provider Note   CSN: [id number] Arrival date & time: 03/15/23  0940     History  Chief Complaint  Patient presents with   Diarrhea    Tami [name] is a 67 y.o. female.  67 yo F with a chief complaints of diarrhea.  This been a chronic problem for her unfortunately.  Has been seen multiple times in the ED for this and is seeing GI and most recently infectious disease.  She had stool studies that were reportedly negative.  Has been on Imodium but without significant improvement.  She feels just depleted and thinks that she needs IV fluids.  Stomach is sore from frequent bowel movements but denies overt tenderness.  Denies fevers.  Denies change in her stools.   Diarrhea      Home Medications Prior to Admission medications   Medication Sig Start Date End Date Taking? Authorizing Provider  cholestyramine (QUESTRAN) 4 g packet Take 1 packet (4 g total) by mouth 3 (three) times daily with meals. 03/15/23  Yes Tami Plan, DO  acyclovir (ZOVIRAX) 400 MG tablet TAKE ONE (1) TABLET BY MOUTH TWO (2) TIMES DAILY 08/29/22   Josph Macho, MD  albuterol (PROVENTIL) (2.5 MG/3ML) 0.083% nebulizer solution Take 3 mLs (2.5 mg total) by nebulization every 6 (six) hours as needed for wheezing or shortness of breath. 12/15/22   [name], MD  albuterol (VENTOLIN HFA) 108 (90 Base) MCG/ACT inhaler Inhale 2 puffs into the lungs every 6 (six) hours as needed for wheezing or shortness of breath. 12/07/22   [name], MD  aspirin EC 81 MG tablet Take 1 tablet (81 mg total) by mouth daily. Patient taking differently: Take 81 mg by mouth every evening. 02/15/18   [name], MD  aspirin EC 81 MG tablet Take 81 mg by mouth daily. Swallow whole.    [provider]  budesonide (PULMICORT) 0.5 MG/2ML nebulizer solution Take 2 mLs (0.5 mg total) by nebulization 2 (two) times daily as needed (cough, wheezing or shortness of breath).  12/31/22   Martina Sinner, MD  cetirizine (ZYRTEC) 10 MG tablet Take 10 mg by mouth at bedtime.    [provider]  Cholecalciferol (VITAMIN D3) 50 MCG (2000 UT) TABS Take 2,000 Units by mouth in the morning.    [provider]  dicyclomine (BENTYL) 10 MG capsule Take 1 capsule (10 mg total) by mouth 4 (four) times daily -  before meals and at bedtime. 01/25/23 04/25/23  Judyann Munson, MD  fluticasone (FLONASE) 50 MCG/ACT nasal spray Place 2 sprays into both nostrils daily. Patient not taking: Reported on 03/03/2023 01/07/20   Harolyn Rutherford C, PA-C  furosemide (LASIX) 20 MG tablet Take 20 mg by mouth daily as needed (fluid retention (feet swelling)). 07/03/20   [provider]  hydrOXYzine (ATARAX) 10 MG tablet TAKE ONE (1) TABLET BY MOUTH 3 TIMES DAILY AS NEEDED FOR ITCHING 03/03/23   [name], MD  hyoscyamine (LEVSIN SL) 0.125 MG SL tablet Place 1 tablet (0.125 mg total) under the tongue every 4 (four) hours as needed. Patient not taking: Reported on 03/03/2023 05/21/22   [name], MD  loperamide (IMODIUM) 2 MG capsule Take 4 mg by mouth in the morning and at bedtime.    [provider]  losartan (COZAAR) 50 MG tablet Take 50 mg by mouth in the morning and at bedtime. 04/14/21   [provider]  Magnesium Glycinate 100 MG CAPS Take 200 mg by mouth at bedtime. 09/21/22   [provider]  meclizine (ANTIVERT) 12.5 MG tablet Take by mouth 2 (two) times daily as needed. 01/28/22   [provider]  meloxicam (MOBIC) 15 MG tablet Take 1 tablet by mouth daily. Patient not taking: Reported on 03/03/2023 12/31/22   [provider]  metoprolol succinate (TOPROL-XL) 50 MG 24 hr tablet Take 1 tablet (50 mg total) by mouth 2 (two) times daily. 02/23/23   [name], MD  Multiple Vitamin Brandt Loosen) TABS Take 1 tablet by mouth daily. 11/06/21   [provider]  nitrofurantoin, macrocrystal-monohydrate, (MACROBID) 100 MG  capsule Take 1 capsule (100 mg total) by mouth 2 (two) times daily. 02/25/23   [name], MD  nortriptyline (PAMELOR) 10 MG capsule Take 1 capsule (10 mg total) by mouth at bedtime. Patient taking differently: Take 10 mg by mouth at bedtime. As needed 01/12/23   [name], MD  oxyCODONE-acetaminophen (PERCOCET/ROXICET) 5-325 MG tablet Take by mouth. Patient not taking: Reported on 03/03/2023 11/07/21   [provider]  potassium chloride SA (KLOR-CON M) 20 MEQ tablet Take 1 tablet (20 mEq total) by mouth daily. 01/15/23   [name], MD  prochlorperazine (COMPAZINE) 10 MG tablet Take 0.5 tablets (5 mg total) by mouth 2 (two) times daily as needed for up to 5 doses for nausea or vomiting. 01/27/23   Curatolo, Adam, DO  spironolactone (ALDACTONE) 25 MG tablet Take 12.5 mg by mouth daily.    [provider]      Allergies    Benazepril, Ondansetron hcl, Codeine, and Morphine    Review of Systems   Review of Systems  Gastrointestinal:  Positive for diarrhea.    Physical Exam Updated Vital Signs BP (!) 159/94 (BP Location: Right Arm)   Pulse 87   Temp 97.7 F (36.5 C) (Oral)   Resp 18   Ht 5\' 6"  (1.676 m)   Wt 87.5 kg   SpO2 97%   BMI 31.15 kg/m  Physical Exam Vitals and nursing note reviewed.  Constitutional:      General: She is not in acute distress.    Appearance: She is well-developed. She is not diaphoretic.  HENT:     Head: Normocephalic and atraumatic.  Eyes:     Pupils: Pupils are equal, round, and reactive to light.  Cardiovascular:     Rate and Rhythm: Normal rate and regular rhythm.     Heart sounds: No murmur heard.    No friction rub. No gallop.  Pulmonary:     Effort: Pulmonary effort is normal.     Breath sounds: No wheezing or rales.  Abdominal:     General: There is no distension.     Palpations: Abdomen is soft.     Tenderness: There is no abdominal tenderness.  Musculoskeletal:        General: No tenderness.     Cervical  back: Normal range of motion and neck supple.  Skin:    General: Skin is warm and dry.  Neurological:     Mental Status: She is alert and oriented to person, place, and time.  Psychiatric:        Behavior: Behavior normal.     ED Results / Procedures / Treatments   Labs (all labs ordered are listed, but only abnormal results are displayed) Labs Reviewed  CBC WITH DIFFERENTIAL/PLATELET - Abnormal; Notable for the following components:  Result Value   WBC 2.0 (*)    Hemoglobin 11.4 (*)    HCT 35.9 (*)    Platelets 104 (*)    Neutro Abs 1.0 (*)    All other components within normal limits  COMPREHENSIVE METABOLIC PANEL - Abnormal; Notable for the following components:   Glucose, Bld 114 (*)    Calcium 8.5 (*)    Total Protein 9.0 (*)    Albumin 3.2 (*)    All other components within normal limits  LIPASE, BLOOD  MAGNESIUM    EKG EKG Interpretation  Date/Time:  Monday Mar 15 2023 10:04:37 EDT Ventricular Rate:  85 PR Interval:  159 QRS Duration: 146 QT Interval:  427 QTC Calculation: 508 R Axis:   -45 Text Interpretation: Sinus rhythm Probable left atrial enlargement Nonspecific IVCD with LAD Left ventricular hypertrophy Inferior infarct, age indeterminate No significant change since last tracing Confirmed by Tami [name] [phone]) on 03/15/2023 10:57:29 AM  Radiology No results found.  Procedures Procedures    Medications Ordered in ED Medications  fentaNYL (SUBLIMAZE) injection 25 mcg (25 mcg Intravenous Not Given 03/15/23 1056)  sodium chloride 0.9 % bolus 1,000 mL (1,000 mLs Intravenous New Bag/Given 03/15/23 1043)    ED Course/ Medical Decision Making/ A&P                             Medical Decision Making Amount and/or Complexity of Data Reviewed Labs: ordered.  Risk Prescription drug management.   67 yo F with a chief complaints of diarrhea.  This been an ongoing issue for her.  Recently has been seen by ID, seen by GI and had colonoscopy and  endoscopy done previously.  She has follow-up with her doctor tomorrow.  Will give a bolus of IV fluids.  Treat nausea pain.  No significant anemia, no significant electrolyte abnormality LFTs lipase are unremarkable.  Patient feeling a bit better on repeat assessment.  Will discharge home.  Trial of bile sequestrant's.  She has follow-up scheduled for tomorrow.  11:46 AM:  I have discussed the diagnosis/risks/treatment options with the patient.  Evaluation and diagnostic testing in the emergency department does not suggest an emergent condition requiring admission or immediate intervention beyond what has been performed at this time.  They will follow up with PCP. We also discussed returning to the ED immediately if new or worsening sx occur. We discussed the sx which are most concerning (e.g., sudden worsening pain, fever, inability to tolerate by mouth) that necessitate immediate return. Medications administered to the patient during their visit and any new prescriptions provided to the patient are listed below.  Medications given during this visit Medications  fentaNYL (SUBLIMAZE) injection 25 mcg (25 mcg Intravenous Not Given 03/15/23 1056)  sodium chloride 0.9 % bolus 1,000 mL (1,000 mLs Intravenous New Bag/Given 03/15/23 1043)     The patient appears reasonably screen and/or stabilized for discharge and I doubt any other medical condition or other [facility] requiring further screening, evaluation, or treatment in the ED at this time prior to discharge.          Final Clinical Impression(s) / ED Diagnoses Final diagnoses:  Chronic diarrhea    Rx / DC Orders ED Discharge Orders          Ordered    cholestyramine (QUESTRAN) 4 g packet  3 times daily with meals        03/15/23 1126  Tami Plan, DO 03/15/23 1146

## 2023-03-16 ENCOUNTER — Ambulatory Visit (INDEPENDENT_AMBULATORY_CARE_PROVIDER_SITE_OTHER): Payer: 59 | Admitting: Family Medicine

## 2023-03-16 VITALS — BP 160/86 | HR 87 | Temp 98.0°F | Resp 16 | Ht 66.0 in | Wt 191.4 lb

## 2023-03-16 DIAGNOSIS — K529 Noninfective gastroenteritis and colitis, unspecified: Secondary | ICD-10-CM | POA: Diagnosis not present

## 2023-03-16 DIAGNOSIS — I10 Essential (primary) hypertension: Secondary | ICD-10-CM

## 2023-03-16 DIAGNOSIS — C9000 Multiple myeloma not having achieved remission: Secondary | ICD-10-CM | POA: Diagnosis not present

## 2023-03-16 DIAGNOSIS — J45909 Unspecified asthma, uncomplicated: Secondary | ICD-10-CM

## 2023-03-16 DIAGNOSIS — R739 Hyperglycemia, unspecified: Secondary | ICD-10-CM

## 2023-03-16 DIAGNOSIS — D649 Anemia, unspecified: Secondary | ICD-10-CM | POA: Diagnosis not present

## 2023-03-16 DIAGNOSIS — E785 Hyperlipidemia, unspecified: Secondary | ICD-10-CM

## 2023-03-16 MED ORDER — BENEFIBER PO POWD: 1.0000 | Freq: Three times a day (TID) | ORAL | 5 refills | Status: AC

## 2023-03-16 MED ORDER — COLESTIPOL HCL 5 G PO GRAN: 5.0000 g | GRANULES | Freq: Three times a day (TID) | ORAL | 5 refills | Status: DC

## 2023-03-16 MED ORDER — PROCHLORPERAZINE MALEATE 10 MG PO TABS: 5.0000 mg | ORAL_TABLET | Freq: Two times a day (BID) | ORAL | 1 refills | Status: DC | PRN

## 2023-03-16 MED ORDER — AMLODIPINE BESYLATE 5 MG PO TABS
5.0000 mg | ORAL_TABLET | Freq: Every day | ORAL | 4 refills | Status: DC
Start: 2023-03-16 — End: 2023-07-30

## 2023-03-16 MED ORDER — ALIGN 4 MG PO CAPS: 1.0000 | ORAL_CAPSULE | Freq: Every day | ORAL | 5 refills | Status: DC

## 2023-03-16 NOTE — Assessment & Plan Note (Signed)
Supplement and monitor 

## 2023-03-16 NOTE — Assessment & Plan Note (Signed)
hgba1c acceptable, minimize simple carbs. Increase exercise as tolerated.  

## 2023-03-16 NOTE — Assessment & Plan Note (Signed)
Following closely with oncology

## 2023-03-16 NOTE — Patient Instructions (Signed)
Diarrhea, Adult Diarrhea is frequent loose and sometimes watery bowel movements. Diarrhea can make you feel weak and cause you to become dehydrated. Dehydration is a condition in which there is not enough water or other fluids in the body. Dehydration can make you tired and thirsty, cause you to have a dry mouth, and decrease how often you urinate. Diarrhea typically lasts 2-3 days. However, it can last longer if it is a sign of something more serious. It is important to treat your diarrhea as told by your health care provider. Follow these instructions at home: Eating and drinking     Follow these recommendations as told by your health care provider: Take an oral rehydration solution (ORS). This is an over-the-counter medicine that helps return your body to its normal balance of nutrients and water. It is found at pharmacies and retail stores. Drink enough fluid to keep your urine pale yellow. Drink fluids such as water, diluted fruit juice, and low-calorie sports drinks. You can drink milk also, if desired. Sucking on ice chips is another way to get fluids. Avoid drinking fluids that contain a lot of sugar or caffeine, such as soda, energy drinks, and regular sports drinks. Avoid alcohol. Eat bland, easy-to-digest foods in small amounts as you are able. These foods include bananas, applesauce, rice, lean meats, toast, and crackers. Avoid spicy or fatty foods.  Medicines Take over-the-counter and prescription medicines only as told by your health care provider. If you were prescribed antibiotics, take them as told by your health care provider. Do not stop using the antibiotic even if you start to feel better. General instructions  Wash your hands often using soap and water for at least 20 seconds. If soap and water are not available, use hand sanitizer. Others in the household should wash their hands as well. Hands should be washed: After using the toilet or changing a diaper. Before  preparing, cooking, or serving food. While caring for a sick person or while visiting someone in a hospital. Rest at home while you recover. Take a warm bath to relieve any burning or pain from frequent diarrhea episodes. Watch your condition for any changes. Contact a health care provider if: You have a fever. Your diarrhea gets worse. You have new symptoms. You vomit every time you eat or drink. You feel light-headed, dizzy, or have a headache. You have muscle cramps. You have signs of dehydration, such as: Dark urine, very little urine, or no urine. Cracked lips. Dry mouth. Sunken eyes. Sleepiness. Weakness. You have bloody or black stools or stools that look like tar. You have severe pain, cramping, or bloating in your abdomen. Your skin feels cold and clammy. You feel confused. Get help right away if: You have chest pain or your heart is beating very quickly. You have trouble breathing or you are breathing very quickly. You feel extremely weak or you faint. These symptoms may be an emergency. Get help right away. Call 911. Do not wait to see if the symptoms will go away. Do not drive yourself to the hospital. This information is not intended to replace advice given to you by your health care provider. Make sure you discuss any questions you have with your health care provider. Document Revised: 04/14/2022 Document Reviewed: 04/14/2022 Elsevier Patient Education  2023 [employer]  

## 2023-03-16 NOTE — Assessment & Plan Note (Addendum)
Continues to struggle despite Cdiff being treated. Will try adding Colestid and reevaluate. Given compazine for nausea

## 2023-03-16 NOTE — Assessment & Plan Note (Signed)
Well controlled, no changes to meds. Encouraged heart healthy diet such as the DASH diet and exercise as tolerated.  °

## 2023-03-16 NOTE — Progress Notes (Signed)
Subjective:   By signing my name below, I, Tami [name], attest that this documentation has been prepared under the direction and in the presence of [name], MD., 03/16/2023.   [name], female    DOB: 1956/11/09, 67 y.o.   MRN: [id number]  Chief Complaint  Patient presents with   Follow-up    Follow up   HPI Patient is in today for an office visit.   Chronic Diarrhea/Hypertension Patient was admitted to the [facility] emergency department yesterday, on 03/15/2023, due to diarrhea. This has been a chronic problem and she was admitted to ED just a a few weeks ago, on 03/04/2023, due to the same problem. She states that she is having 3-4 loose bowel movements daily. Additionally, she complains of headache, imbalance, nausea and occasional vomiting but denies chest pain/palpitations/SOB/fever/chills/abdominal pain/blood in stool/or urinary symptoms. Her blood pressure is elevated today at 160/86 and was also elevated during her ED admission at 156/94. She currently takes Losartan 50 mg twice daily, Metoprolol Succinate 50 mg twice daily, and Spironolactone 25 mg once daily, though these have recently been ineffective. Patient reports that she recently had an echocardiogram and these records are needed. BP Readings from Last 3 Encounters:  03/17/23 (!) 152/84  03/16/23 (!) 160/86  03/15/23 (!) 180/96   Pulse Readings from Last 3 Encounters:  03/17/23 80  03/16/23 87  03/15/23 76   Past Medical History:  Diagnosis Date   Abnormal SPEP 07/26/2016   Arthritis    knees, hands   Back pain 07/14/2016   Bowel obstruction (HCC) 11/2014   C. 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   Multiple myeloma (HCC) 07/20/2018   Multiple myeloma not having achieved remission (HCC) 07/20/2018   Myalgia 12/31/2016   Obstructive sleep apnea 03/31/2015   Presence of permanent cardiac pacemaker    SOB (shortness of breath) 04/09/2016   Stroke (HCC)    TIAs   UTI (urinary tract infection)     Past Surgical History:  Procedure Laterality Date   ABDOMINAL HYSTERECTOMY     menorraghia, 2006, total   BIOPSY  09/01/2022   Procedure: BIOPSY;  Surgeon: Lynann Bologna, MD;  Location: [facility];  Service: Gastroenterology;;   CHOLECYSTECTOMY     COLONOSCOPY  2018   [facility] per patient   COLONOSCOPY WITH PROPOFOL N/A 09/01/2022   Procedure: COLONOSCOPY WITH PROPOFOL;  Surgeon: Lynann Bologna, MD;  Location: [facility];  Service: Gastroenterology;  Laterality: N/A;   ESOPHAGOGASTRODUODENOSCOPY  2018   [facility]    ESOPHAGOGASTRODUODENOSCOPY (EGD) WITH PROPOFOL N/A 09/01/2022   Procedure: ESOPHAGOGASTRODUODENOSCOPY (EGD) WITH PROPOFOL;  Surgeon: Lynann Bologna, MD;  Location: [facility];  Service: Gastroenterology;  Laterality: N/A;   KNEE SURGERY     right, repair torn torn cartialage   PACEMAKER INSERTION  08/2014   TONSILLECTOMY     TUBAL LIGATION      Family History  Problem Relation Age of Onset   Diabetes Mother  Hypertension Mother    Heart disease Mother        s/p 1 stent   Hyperlipidemia Mother    Arthritis Mother    Cancer Father        COLON   Colon cancer Father 32   Irritable bowel syndrome Sister    Hyperlipidemia Daughter    Hypertension Daughter    Hypertension Maternal Grandmother    Arthritis Maternal Grandmother    Heart disease Maternal Grandfather        MI   Hypertension Maternal Grandfather    Arthritis Maternal  Grandfather    Hypertension Son    Esophageal cancer Neg Hx     Social History   Socioeconomic History   Marital status: Divorced    Spouse name: [name]   Number of children: 3   Years of education: [name]   Highest education level: High school graduate  Occupational History   [name]  Tobacco Use   Smoking status: Never    Passive exposure: Never   Smokeless tobacco: Never  Vaping Use   Vaping Use: Never used  Substance and Sexual Activity   Alcohol use: No   Drug use: No   Sexual activity: Not Currently  Other Topics Concern   [name]  Social History Narrative   Lives at home alone   Retired   Caffeine: coffee   Social Determinants of Health   Financial Resource Strain: Low Risk  (10/17/2021)   Overall Financial Resource Strain (CARDIA)    Difficulty of Paying Living Expenses: Not hard at all  Food Insecurity: No Food Insecurity (01/21/2023)   Hunger Vital Sign    Worried About Running Out of Food in the Last Year: Never true    Ran Out of Food in the Last Year: Never true  Transportation Needs: No Transportation Needs (01/21/2023)   PRAPARE - Administrator, Civil Service (Medical): No    Lack of Transportation (Non-Medical): No  Physical Activity: Insufficiently Active (10/17/2021)   Exercise Vital Sign    Days of Exercise per Week: 2 days    Minutes of Exercise per Session: 30 min  Stress: No Stress Concern Present (01/21/2023)   [employer] of Occupational Health - Occupational Stress Questionnaire    Feeling of Stress : Not at all  Social Connections: Moderately Isolated (10/17/2021)   Social Connection and Isolation Panel [NHANES]    Frequency of Communication with Friends and Family: More than three times a week    Frequency of Social Gatherings with Friends and Family: More than three times a week    Attends Religious Services: More than 4 times per year    Active Member of [employer] or Organizations: No    Attends Tax inspector Meetings: Never    Marital Status: Divorced  Catering manager Violence: Not At Risk (10/17/2021)   Humiliation, Afraid, Rape, and Kick questionnaire    Fear of Current or Ex-Partner: No    Emotionally Abused: No    Physically Abused: No    Sexually Abused: No    Outpatient Medications Prior to Visit  Medication Sig Dispense Refill   acyclovir (ZOVIRAX) 400 MG tablet TAKE ONE (1) TABLET BY MOUTH TWO (2) TIMES DAILY 60 tablet 2   albuterol (PROVENTIL) (2.5 MG/3ML) 0.083% nebulizer solution Take 3 mLs (2.5 mg total) by nebulization every 6 (six) hours as needed for wheezing or shortness of breath. 150 mL 2   albuterol (VENTOLIN HFA)  108 (90 Base) MCG/ACT inhaler Inhale 2 puffs into the lungs every 6 (six) hours as needed for wheezing or shortness of breath. 18 g 5   aspirin EC 81 MG tablet Take 81 mg by mouth daily. Swallow whole.     budesonide (PULMICORT) 0.5 MG/2ML nebulizer solution Take 2 mLs (0.5 mg total) by nebulization 2 (two) times daily as needed (cough, wheezing or shortness of breath). 120 mL 11   cetirizine (ZYRTEC) 10 MG tablet Take 10 mg by mouth at bedtime.     Cholecalciferol (VITAMIN D3) 50 MCG (2000 UT) TABS Take 2,000 Units by mouth in the morning.     dicyclomine (BENTYL) 10 MG capsule Take 1 capsule (10 mg total) by mouth 4 (four) times daily -  before meals and at bedtime. 120 capsule 2   fluticasone (FLONASE) 50 MCG/ACT nasal spray Place 2 sprays into both nostrils daily. 16 g 0   furosemide (LASIX) 20 MG tablet Take 20 mg by mouth daily as needed (fluid retention (feet swelling)).     hydrOXYzine (ATARAX) 10 MG tablet TAKE ONE (1) TABLET BY MOUTH 3 TIMES DAILY AS NEEDED FOR ITCHING 30 tablet 2   hyoscyamine (LEVSIN SL) 0.125 MG SL tablet Place 1 tablet (0.125 mg total) under the tongue every 4 (four) hours as needed. 30 tablet 1   loperamide (IMODIUM) 2 MG capsule Take 4 mg by mouth in the morning and at bedtime.     losartan (COZAAR) 50 MG tablet Take 50  mg by mouth in the morning and at bedtime. (Patient not taking: Reported on 03/17/2023)     Magnesium Glycinate 100 MG CAPS Take 200 mg by mouth at bedtime.     meclizine (ANTIVERT) 12.5 MG tablet Take by mouth 2 (two) times daily as needed.     meloxicam (MOBIC) 15 MG tablet Take 1 tablet by mouth daily.     metoprolol succinate (TOPROL-XL) 50 MG 24 hr tablet Take 1 tablet (50 mg total) by mouth 2 (two) times daily. 60 tablet 2   Multiple Vitamin (QUINTABS) TABS Take 1 tablet by mouth daily.     nitrofurantoin, macrocrystal-monohydrate, (MACROBID) 100 MG capsule Take 1 capsule (100 mg total) by mouth 2 (two) times daily. (Patient not taking: Reported on 03/17/2023) 14 capsule 0   nortriptyline (PAMELOR) 10 MG capsule Take 1 capsule (10 mg total) by mouth at bedtime. (Patient taking differently: Take 10 mg by mouth at bedtime. As needed) 30 capsule 1   oxyCODONE-acetaminophen (PERCOCET/ROXICET) 5-325 MG tablet Take by mouth.     potassium chloride SA (KLOR-CON M) 20 MEQ tablet Take 1 tablet (20 mEq total) by mouth daily. 90 tablet 1   spironolactone (ALDACTONE) 25 MG tablet Take 12.5 mg by mouth daily. (Patient not taking: Reported on 03/17/2023)     aspirin EC 81 MG tablet Take 1 tablet (81 mg total) by mouth daily. (Patient taking differently: Take 81 mg by mouth every evening.) 90 tablet 1   cholestyramine (QUESTRAN) 4 g packet Take 1 packet (4 g total) by mouth 3 (three) times daily with meals. 60 each 0   prochlorperazine (COMPAZINE) 10 MG tablet Take 0.5 tablets (5 mg total) by mouth 2 (two) times daily as needed for up to 5 doses for nausea or vomiting. 3 tablet 0   Facility-Administered Medications Prior to Visit  Medication Dose Route Frequency Provider Last Rate Last Admin   aspirin chewable tablet 162 mg  162 mg Oral Once [name], MD  nitroGLYCERIN (NITROSTAT) SL tablet 0.4 mg  0.4 mg Sublingual Once [name], MD        Allergies  Allergen Reactions   Benazepril  Anaphylaxis, Swelling and Hives    angioedema Throat and lip swelling   Ondansetron Hcl Hives    Redness and hives post IV admin on 07/05/17   Codeine Nausea And Vomiting   Morphine Hives    Redness and hives noted post IV admin on 07/05/17    Review of Systems  Constitutional:  Negative for chills and fever.  Respiratory:  Negative for shortness of breath.   Cardiovascular:  Negative for chest pain and palpitations.  Gastrointestinal:  Positive for diarrhea, nausea and vomiting. Negative for abdominal pain and blood in stool.  Genitourinary:  Negative for dysuria, frequency, hematuria and urgency.  Skin:           Neurological:  Positive for headaches.       Objective:    Physical Exam Constitutional:      General: She is not in acute distress.    Appearance: Normal appearance. She is not ill-appearing.  HENT:     Head: Normocephalic and atraumatic.     Right Ear: External ear normal.     Left Ear: External ear normal.     Nose: Nose normal.     Mouth/Throat:     Mouth: Mucous membranes are moist.     Pharynx: Oropharynx is clear.  Eyes:     General:        Right eye: No discharge.        Left eye: No discharge.     Extraocular Movements: Extraocular movements intact.     Conjunctiva/sclera: Conjunctivae normal.     Pupils: Pupils are equal, round, and reactive to light.  Cardiovascular:     Rate and Rhythm: Normal rate and regular rhythm.     Pulses: Normal pulses.     Heart sounds: Normal heart sounds. No murmur heard.    No gallop.  Pulmonary:     Effort: Pulmonary effort is normal. No respiratory distress.     Breath sounds: Normal breath sounds. No wheezing or rales.  Abdominal:     General: Bowel sounds are normal.     Palpations: Abdomen is soft.     Tenderness: There is no abdominal tenderness. There is no guarding.  Musculoskeletal:        General: Normal range of motion.     Cervical back: Normal range of motion.     Right lower leg: No edema.      Left lower leg: No edema.  Skin:    General: Skin is warm and dry.  Neurological:     Mental Status: She is alert and oriented to person, place, and time.  Psychiatric:        Mood and Affect: Mood normal.        Behavior: Behavior normal.        Judgment: Judgment normal.     BP (!) 160/86 (BP Location: Left Arm, Patient Position: Sitting, Cuff Size: Normal)   Pulse 87   Temp 98 F (36.7 C) (Oral)   Resp 16   Ht 5\' 6"  (1.676 m)   Wt 191 lb 6.4 oz (86.8 kg)   SpO2 100%   BMI 30.89 kg/m  Wt Readings from Last 3 Encounters:  03/16/23 191 lb 6.4 oz (86.8 kg)  03/15/23 193 lb (87.5 kg)  03/01/23 197 lb (89.4 kg)    Diabetic Foot  Exam - Simple   No data filed    Lab Results  Component Value Date   WBC 2.3 Repeated and verified X2. (L) 03/16/2023   HGB 12.3 03/16/2023   HCT 36.8 03/16/2023   PLT 130.0 (L) 03/16/2023   GLUCOSE 95 03/16/2023   CHOL 167 03/16/2023   TRIG 118.0 03/16/2023   HDL 49.20 03/16/2023   LDLCALC 95 03/16/2023   ALT 15 03/16/2023   AST 23 03/16/2023   NA 137 03/16/2023   K 3.7 03/16/2023   CL 104 03/16/2023   CREATININE 0.59 03/16/2023   BUN 13 03/16/2023   CO2 26 03/16/2023   TSH 1.34 03/16/2023   INR 0.90 07/28/2018   HGBA1C 5.7 03/16/2023    Lab Results  Component Value Date   TSH 1.34 03/16/2023   Lab Results  Component Value Date   WBC 2.3 Repeated and verified X2. (L) 03/16/2023   HGB 12.3 03/16/2023   HCT 36.8 03/16/2023   MCV 87.9 03/16/2023   PLT 130.0 (L) 03/16/2023   Lab Results  Component Value Date   NA 137 03/16/2023   K 3.7 03/16/2023   CHLORIDE 108 12/31/2016   CO2 26 03/16/2023   GLUCOSE 95 03/16/2023   BUN 13 03/16/2023   CREATININE 0.59 03/16/2023   BILITOT 0.3 03/16/2023   ALKPHOS 51 03/16/2023   AST 23 03/16/2023   ALT 15 03/16/2023   PROT 9.1 (H) 03/16/2023   ALBUMIN 3.7 03/16/2023   CALCIUM 9.2 03/16/2023   ANIONGAP 5 03/15/2023   EGFR >90 12/31/2016   GFR 92.36 03/16/2023   Lab Results   Component Value Date   CHOL 167 03/16/2023   Lab Results  Component Value Date   HDL 49.20 03/16/2023   Lab Results  Component Value Date   LDLCALC 95 03/16/2023   Lab Results  Component Value Date   TRIG 118.0 03/16/2023   Lab Results  Component Value Date   CHOLHDL 3 03/16/2023   Lab Results  Component Value Date   HGBA1C 5.7 03/16/2023      Assessment & Plan:  Chronic Diarrhea: Align probiotics, Benefiber, and Colestipol prescribed..  Hypertension: Losartan 50 mg and Spironolactone 25 mg stopped. Continue taking Metoprolol Succinate 50 mg twice daily. Amlodipine 5 mg prescribed: take 1 tablet by mouth daily. Patient will return for a 2-3 week pulse and bp check.  Problem List Items Addressed This Visit     Hyperlipidemia   Relevant Medications   colestipol (COLESTID) 5 g granules   amLODipine (NORVASC) 5 MG tablet   Other Relevant Orders   Lipid panel (Completed)   Anemia - Primary    Supplement and monitor       Asthma    Albuterol prn      Hyperglycemia    hgba1c acceptable, minimize simple carbs. Increase exercise as tolerated.       Relevant Orders   HgB A1c (Completed)   Hypertension    Well controlled, no changes to meds. Encouraged heart healthy diet such as the DASH diet and exercise as tolerated.        Relevant Medications   colestipol (COLESTID) 5 g granules   amLODipine (NORVASC) 5 MG tablet   Other Relevant Orders   Comp Met (CMET) (Completed)   CBC w/Diff (Completed)   TSH (Completed)   Multiple myeloma not having achieved remission (HCC) (Chronic)    Following closely with oncology      Relevant Medications   prochlorperazine (COMPAZINE) 10 MG tablet  Chronic diarrhea    Continues to struggle despite Cdiff being treated. Will try adding Colestid and reevaluate. Given compazine for nausea      Meds ordered this encounter  Medications   colestipol (COLESTID) 5 g granules    Sig: Take 5 g by mouth 3 (three) times daily.     Dispense:  750 g    Refill:  5   Probiotic Product (ALIGN) 4 MG CAPS    Sig: Take 1 capsule (4 mg total) by mouth daily.    Dispense:  30 capsule    Refill:  5   Wheat Dextrin (BENEFIBER) POWD    Sig: Take 1 Dose by mouth in the morning, at noon, and at bedtime.    Dispense:  730 g    Refill:  5   prochlorperazine (COMPAZINE) 10 MG tablet    Sig: Take 0.5 tablets (5 mg total) by mouth 2 (two) times daily as needed for nausea or vomiting.    Dispense:  60 tablet    Refill:  1   amLODipine (NORVASC) 5 MG tablet    Sig: Take 1 tablet (5 mg total) by mouth daily.    Dispense:  30 tablet    Refill:  4   I, Danise Edge, MD, personally preformed the services described in this documentation.  All medical record entries made by the scribe were at my direction and in my presence.  I have reviewed the chart and discharge instructions (if applicable) and agree that the record reflects my personal performance and is accurate and complete. 03/16/2023  I,Mohammed Iqbal,acting as a scribe for Danise Edge, MD.,have documented all relevant documentation on the behalf of Danise Edge, MD,as directed by  Danise Edge, MD while in the presence of Danise Edge, MD.  Danise Edge, MD

## 2023-03-16 NOTE — Assessment & Plan Note (Signed)
Albuterol prn

## 2023-03-17 ENCOUNTER — Ambulatory Visit: Payer: Self-pay

## 2023-03-17 ENCOUNTER — Ambulatory Visit: Payer: Self-pay | Admitting: Licensed Clinical Social Worker

## 2023-03-17 ENCOUNTER — Inpatient Hospital Stay: Payer: 59 | Attending: Hematology & Oncology

## 2023-03-17 VITALS — BP 152/84 | HR 80 | Temp 97.9°F | Resp 18

## 2023-03-17 DIAGNOSIS — Z5189 Encounter for other specified aftercare: Secondary | ICD-10-CM | POA: Insufficient documentation

## 2023-03-17 DIAGNOSIS — C9000 Multiple myeloma not having achieved remission: Secondary | ICD-10-CM | POA: Diagnosis not present

## 2023-03-17 DIAGNOSIS — R197 Diarrhea, unspecified: Secondary | ICD-10-CM | POA: Diagnosis not present

## 2023-03-17 DIAGNOSIS — E860 Dehydration: Secondary | ICD-10-CM

## 2023-03-17 LAB — COMPREHENSIVE METABOLIC PANEL
ALT: 15 U/L (ref 0–35)
AST: 23 U/L (ref 0–37)
Albumin: 3.7 g/dL (ref 3.5–5.2)
Alkaline Phosphatase: 51 U/L (ref 39–117)
BUN: 13 mg/dL (ref 6–23)
CO2: 26 mEq/L (ref 19–32)
Calcium: 9.2 mg/dL (ref 8.4–10.5)
Chloride: 104 mEq/L (ref 96–112)
Creatinine, Ser: 0.59 mg/dL (ref 0.40–1.20)
GFR: 92.36 mL/min (ref >60.00)
Glucose, Bld: 95 mg/dL (ref 70–99)
Potassium: 3.7 mEq/L (ref 3.5–5.1)
Sodium: 137 mEq/L (ref 135–145)
Total Bilirubin: 0.3 mg/dL (ref 0.2–1.2)
Total Protein: 9.1 g/dL — ABNORMAL HIGH (ref 6.0–8.3)

## 2023-03-17 LAB — CBC WITH DIFFERENTIAL/PLATELET
Basophils Absolute: 0 10*3/uL (ref 0.0–0.1)
Basophils Relative: 1.6 % (ref 0.0–3.0)
Eosinophils Absolute: 0 10*3/uL (ref 0.0–0.7)
Eosinophils Relative: 1.8 % (ref 0.0–5.0)
HCT: 36.8 % (ref 36.0–46.0)
Hemoglobin: 12.3 g/dL (ref 12.0–15.0)
Lymphocytes Relative: 43.7 % (ref 12.0–46.0)
Lymphs Abs: 1 10*3/uL (ref 0.7–4.0)
MCHC: 33.4 g/dL (ref 30.0–36.0)
MCV: 87.9 fl (ref 78.0–100.0)
Monocytes Absolute: 0.3 10*3/uL (ref 0.1–1.0)
Monocytes Relative: 11.3 % (ref 3.0–12.0)
Neutro Abs: 1 10*3/uL — ABNORMAL LOW (ref 1.4–7.7)
Neutrophils Relative %: 41.6 % — ABNORMAL LOW (ref 43.0–77.0)
Platelets: 130 10*3/uL — ABNORMAL LOW (ref 150.0–400.0)
RBC: 4.19 Mil/uL (ref 3.87–5.11)
RDW: 14.5 % (ref 11.5–15.5)
WBC: 2.3 10*3/uL — ABNORMAL LOW (ref 4.0–10.5)

## 2023-03-17 LAB — LIPID PANEL
Cholesterol: 167 mg/dL (ref 0–200)
HDL: 49.2 mg/dL (ref >39.00)
LDL Cholesterol: 95 mg/dL (ref 0–99)
NonHDL: 118.19
Total CHOL/HDL Ratio: 3
Triglycerides: 118 mg/dL (ref 0.0–149.0)
VLDL: 23.6 mg/dL (ref 0.0–40.0)

## 2023-03-17 LAB — HEMOGLOBIN A1C: Hgb A1c MFr Bld: 5.7 % (ref 4.6–6.5)

## 2023-03-17 LAB — TSH: TSH: 1.34 u[IU]/mL (ref 0.35–5.50)

## 2023-03-17 MED ORDER — SODIUM CHLORIDE 0.9 % IV SOLN: INTRAVENOUS | Status: DC

## 2023-03-17 NOTE — Patient Instructions (Addendum)
Visit Information  Thank you for taking time to visit with me today. Please don't hesitate to contact me if I can be of assistance to you.   Following are the goals we discussed today:  Continue to attend provider appointments as scheduled Continue to take medications as scheduled Please see handout on foods to eat provided by provider to assist in planning meals Contact provider if condition does not improve or worsens   Our next appointment is by telephone on 04/12/23 at 9:30 am  Please call the care guide team at [phone] if you need to cancel or reschedule your appointment.   If you are experiencing a Mental Health or Behavioral Health Crisis or need someone to talk to, please call the Suicide and Crisis Lifeline: 40  Kathyrn Sheriff, RN, MSN, BSN, CCM [facility] Care Coordinator [phone]     Irritable Bowel Syndrome, Adult  Irritable bowel syndrome (IBS) is a group of symptoms that affects the organs responsible for digestion (gastrointestinal tract, or GI tract). IBS is not one specific disease. To regulate how the GI tract works, the body sends signals back and forth between the intestines and the brain. If you have IBS, there may be a problem with these signals. As a result, the GI tract does not function normally. The intestines may become more sensitive and overreact to certain things. This may be especially true when you eat certain foods or when you are under stress. There are four main types of IBS. These may be determined based on the consistency of your stool (feces): IBS with mostly (predominance of) diarrhea. IBS with predominance of constipation. IBS with mixed bowel habits. This includes both diarrhea and constipation. IBS unclassified. This includes IBS that cannot be categorized into one of the other three main types. It is important to know which type of IBS you have. Certain treatments are more likely to be helpful for certain types of IBS. What are the  causes? The exact cause of IBS is not known. What increases the risk? You may have a higher risk for IBS if you: Are female. Are younger than 40 years. Have a family history of IBS. Have a mental health condition, such as depression, anxiety, or post-traumatic stress disorder. Have had a bacterial infection of your GI tract. What are the signs or symptoms? Symptoms of IBS vary from person to person. The main symptom is abdominal pain or discomfort. Other symptoms usually include one or more of the following: Diarrhea, constipation, or both. Swelling or bloating in the abdomen. Feeling full after eating a small or regular-sized meal. Frequent gas. Mucus in the stool. A feeling of having more stool left after a bowel movement. Symptoms tend to come and go. They may be triggered by stress, mental health conditions, or certain foods. How is this diagnosed? This condition may be diagnosed based on a physical exam, your medical history, and your symptoms. You may have tests, such as: Blood tests. Stool test. Colonoscopy. This is a procedure in which your GI tract is viewed with a long, thin, flexible tube. How is this treated? There is no cure for IBS, but treatment can help relieve symptoms. Treatment depends on the type of IBS you have, and may include: Changes to your diet, such as: Avoiding foods that cause symptoms. Drinking more water. Following a low-FODMAP (fermentable oligosaccharides, disaccharides, monosaccharides, and polyols) diet for up to 6 weeks, or as told by your health care provider. FODMAPs are sugars that are hard for some people  to digest. Eating more fiber. Eating small meals at the same times every day. Medicines. These may include: Fiber supplements, if you have constipation. Medicine to control diarrhea (antidiarrheal medicines). Medicine to help control muscle tightening (spasms) in your GI tract (antispasmodic medicines). Medicines to help with mental health  conditions, such as antidepressants. Talk therapy or counseling. Working with a dietitian to help create a food plan that is right for you. Managing your stress. Follow these instructions at home: Eating and drinking  Eat a healthy diet. Eat 5-6 small meals a day. Try to eat meals at about the same times each day. Do not eat large meals. Gradually eat more fiber-rich foods. These include whole grains, fruits, and vegetables. This may be especially helpful if you have IBS with constipation. Eat a diet low in FODMAPs. You may need to avoid foods such as citrus fruits, cabbage, garlic, and onions. Drink enough fluid to keep your urine pale yellow. Keep a journal of foods that seem to trigger symptoms. Avoid foods and drinks that: Contain added sugar. Make your symptoms worse. These may include dairy products, caffeinated drinks, and carbonated drinks. Alcohol use Do not drink alcohol if: Your health care provider tells you not to drink. You are pregnant, may be pregnant, or are planning to become pregnant. If you drink alcohol: Limit how much you have to: 0-1 drink a day for women. 0-2 drinks a day for men. Know how much alcohol is in your drink. In the [place] one drink equals one 12 oz bottle of beer (355 mL), one 5 oz glass of wine (148 mL), or one 1 oz glass of hard liquor (44 mL) General instructions Take over-the-counter and prescription medicines only as told by your health care provider. This includes supplements. Get enough exercise. Do at least 150 minutes of moderate-intensity exercise each week. Manage your stress. Getting enough sleep and exercise can help you manage stress. Keep all follow-up visits. This is important. This includes all visits with your health care provider and therapist. Where to find more information International Foundation for Functional Gastrointestinal Disorders: aboutibs.[employer] of Diabetes and Digestive and Kidney Diseases:  [employer]si Contact a health care provider if: You have constant pain. You lose weight. You have diarrhea that gets worse. You have bleeding from the rectum. You vomit often. You have a fever. Get help right away if: You have severe abdominal pain. You have diarrhea with symptoms of dehydration, such as dizziness or dry mouth. You have bloody or black stools. You have severe abdominal bloating. You have vomiting that does not stop. You have blood in your vomit. Summary Irritable bowel syndrome (IBS) is not one specific disease. It is a group of symptoms that affects digestion. Your intestines may become more sensitive and overreact to certain things. This may be especially true when you eat certain foods or when you are under stress. There is no cure for IBS, but treatment can help relieve symptoms. This information is not intended to replace advice given to you by your health care provider. Make sure you discuss any questions you have with your health care provider. Document Revised: 10/08/2021 Document Reviewed: 10/08/2021 Elsevier Patient Education  2023 [employer].

## 2023-03-17 NOTE — Patient Outreach (Signed)
  Care Coordination  Follow Up Visit Note   03/17/2023 Name: Tami [name] MRN: [id number] DOB: 11-13-1955  Tami [name] is a 67 y.o. year old female who sees [name], MD for primary care. I spoke with  [name] by phone today.  What matters to the patients health and wellness today?  Getting personal care services set up    Goals Addressed             This Visit's Progress    Get Personal Care Services       Activities and task to complete in order to accomplish goals.   We discussed Personal Care Services,   Please call [facility] [phone] to schedule your assessment.  We will discussed your symptoms of depression if you would like during our next call  We can also work on getting your MyChart set up.         SDOH assessments and interventions completed:  No   Care Coordination Interventions:  Yes, provided  Interventions Today    Flowsheet Row Most Recent Value  Chronic Disease   Chronic disease during today's visit Hypertension (HTN), Congestive Heart Failure (CHF)  General Interventions   General Interventions Discussed/Reviewed Communication with, Level of Care  Communication with RN  Level of Care Personal Care Services  [problem solving, task centered, discussed barriers]       Follow up plan:  Patient did not wish to schedule a time asked LCSW to call next week.    Encounter Outcome:  Pt. Visit Completed   [name], LCSW Social Work Care Coordination  [facility] [employer] [phone]

## 2023-03-17 NOTE — Patient Instructions (Signed)
Dehydration, Adult Dehydration is a condition in which there is not enough water or other fluids in the body. This happens when a person loses more fluids than they take in. Important organs, such as the kidneys, brain, and heart, cannot function without a proper amount of fluids. Any loss of fluids from the body can lead to dehydration. Dehydration can be mild, moderate, or severe. It should be treated right away to prevent it from becoming severe. What are the causes? Dehydration may be caused by: Health conditions, such as diarrhea, vomiting, fever, infection, or sweating or urinating a lot. Not drinking enough fluids. Certain medicines, such as medicines that remove excess fluid from the body (diuretics). Lack of safe drinking water. Not being able to get enough water and food. What increases the risk? The following factors may make you more likely to develop this condition: Having a long-term (chronic) illness that has not been treated properly, such as diabetes, heart disease, or kidney disease. Being 65 years of age or older. Having a disability. Living in a place that is high in altitude, where thinner, drier air causes more fluid loss. Doing exercises that put stress on your body for a long time (endurance sports). Being active in a hot climate. What are the signs or symptoms? Symptoms of dehydration depend on how severe it is. Mild or moderate dehydration Thirst. Dry lips or dry mouth. Dizziness or light-headedness. Muscle cramps. Dark urine. Urine may be the color of tea. Less urine or tears produced than usual. Headache. Severe dehydration Changes in skin. Your skin may be cold and clammy, blotchy, or pale. Your skin also may not return to normal after being lightly pinched and released. Little or no tears, urine, or sweat. Rapid breathing and low blood pressure. Your pulse may be weak or may be faster than 100 beats per minute when you are sitting still. Other changes,  such as: Feeling very thirsty. Sunken eyes. Cold hands and feet. Confusion. Being very tired (lethargic) or having trouble waking from sleep. Short-term weight loss. Loss of consciousness. How is this diagnosed? This condition is diagnosed based on your symptoms and a physical exam. You may have blood and urine tests to help confirm the diagnosis. How is this treated? Treatment for this condition depends on how severe it is. Treatment should be started right away. Do not wait until dehydration becomes severe. Severe dehydration is an emergency and needs to be treated in a hospital. Mild or moderate dehydration can be treated at home. You may be asked to: Drink more fluids. Drink an oral rehydration solution (ORS). This drink restores fluids, salts, and minerals in the blood (electrolytes). Stop any activities that caused dehydration, such as exercise. Cool off with cool compresses, cool mist, or cool fluids, if heat or too much sweat caused your condition. Take medicine to treat fever, if fever caused your condition. Take medicine to treat nausea and diarrhea, if vomiting or diarrhea caused your condition. Severe dehydration can be treated: With IV fluids. By correcting abnormal levels of electrolytes in your body. By treating the underlying cause of dehydration. Follow these instructions at home: Oral rehydration solution If told by your health care provider, drink an ORS: Make an ORS by following instructions on the package. Start by drinking small amounts, about  cup (120 mL) every 5-10 minutes. Slowly increase how much you drink until you have taken the amount recommended by your health care provider.  Eating and drinking  Drink enough clear fluid to keep   your urine pale yellow. If you were told to drink an ORS, finish the ORS first and then start slowly drinking other clear fluids. Drink fluids such as: Water. Do not drink only water. Doing that can lead to hyponatremia, which  is having too little salt (sodium) in the body. Water from ice chips you suck on. Diluted fruit juice. This is fruit juice that you have added water to. Low-calorie sports drinks. Eat foods that contain a healthy balance of electrolytes, such as bananas, oranges, potatoes, tomatoes, and spinach. Do not drink alcohol. Avoid the following: Drinks that contain a lot of sugar. These include high-calorie sports drinks, fruit juice that is not diluted, and soda. Caffeine. Foods that are greasy or contain a lot of fat or sugar. General instructions Take over-the-counter and prescription medicines only as told by your health care provider. Do not take sodium tablets. Doing that can lead to having too much sodium in the body (hypernatremia). Return to your normal activities as told by your health care provider. Ask your health care provider what activities are safe for you. Keep all follow-up visits. Your health care provider may need to check your progress and suggest new ways to treat your condition. Contact a health care provider if: You have muscle cramps, pain, or discomfort, such as: Pain in your abdomen and the pain gets worse or stays in one area. Stiff neck. You have a rash. You are more irritable than usual. You are sleepier or have a harder time waking. You feel weak or dizzy. You feel very thirsty. Get help right away if: You have symptoms of severe dehydration. You vomit every time you eat or drink. Your vomiting gets worse, does not go away, or includes blood or green matter (bile). You are getting treatment but symptoms are getting worse. You have a fever. You have a severe headache. You have: Diarrhea that gets worse or does not go away. Blood in your stool. This may cause stool to look black and tarry. Not urinating, or urinating only a small amount of very dark urine, within 6-8 hours. You have trouble breathing. These symptoms may be an emergency. Get help right  away. Do not wait to see if the symptoms will go away. Do not drive yourself to the hospital. Call 911. This information is not intended to replace advice given to you by your health care provider. Make sure you discuss any questions you have with your health care provider. Document Revised: 05/25/2022 Document Reviewed: 05/25/2022 Elsevier Patient Education  2023 Elsevier Inc. 

## 2023-03-17 NOTE — Patient Instructions (Signed)
Visit Information  Thank you for taking time to visit with me today. Please don't hesitate to contact me if I can be of assistance to you.   Following are the goals we discussed today:   Goals Addressed             This Visit's Progress    Get Personal Care Services       Activities and task to complete in order to accomplish goals.   We discussed Personal Care Services,   Please call [facility] [phone] to schedule your assessment.  We will discussed your symptoms of depression if you would like during our next call  We can also work on getting your MyChart set up.          Please call the care guide team at [phone] if you need to cancel or reschedule your appointment.    The patient verbalized understanding of instructions, educational materials, and care plan provided today and DECLINED offer to receive copy of patient instructions, educational materials, and care plan.   [name], LCSW Social Work Care Coordination  [facility] [employer] [phone]

## 2023-03-17 NOTE — Patient Outreach (Signed)
  Care Coordination   Follow Up Visit Note   03/17/2023 Name: Tami [name] MRN: [id number] DOB: 08-Feb-1956  Tami [name] is a 67 y.o. year old female who sees [name], MD for primary care. I spoke with  [name] by phone today.  What matters to the patients health and wellness today?  [name] reports she is feeling better. Reports received IV fluids today. PCP visit completed on 03/17/23 and she reports providers think diarrhea is due to IBS. [name] is working with D. Christell Constant, LCSW regarding personal care assistance resources.  Goals Addressed             This Visit's Progress    Assistance with Health Management       Interventions Today    Flowsheet Row Most Recent Value  Chronic Disease   Chronic disease during today's visit Other  [chronic diarrhea, IBS]  General Interventions   General Interventions Discussed/Reviewed General Interventions Reviewed, Doctor Visits, Communication with  Doctor Visits Discussed/Reviewed Doctor Visits Discussed, PCP  [reviewed upcoming scheduled appointments.]  PCP/Specialist Visits Compliance with follow-up visit  Communication with Social Work  [assisted with scheduling follow up appointment with social worker]  Education Interventions   Education Provided Provided Web-based Education  [IBS. reviewed patient instructions per PCP visit on 03/16/23]  Nutrition Interventions   Nutrition Discussed/Reviewed Nutrition Discussed  [reviewed dietary instructions per PCP office visit on 03/16/23]  Pharmacy Interventions   Pharmacy Dicussed/Reviewed Pharmacy Topics Discussed, Pharmacy Topics Reviewed  [medication reviewed]            SDOH assessments and interventions completed:  No  Care Coordination Interventions:  Yes, provided   Follow up plan: Follow up call scheduled for 04/12/23    Encounter Outcome:  Pt. Visit Completed   Kathyrn Sheriff, RN, MSN, BSN, CCM [facility] Care Coordinator [phone]

## 2023-03-19 ENCOUNTER — Encounter: Payer: Self-pay | Admitting: Family Medicine

## 2023-03-19 ENCOUNTER — Other Ambulatory Visit: Payer: Self-pay | Admitting: Family Medicine

## 2023-03-19 DIAGNOSIS — R197 Diarrhea, unspecified: Secondary | ICD-10-CM | POA: Diagnosis not present

## 2023-03-19 DIAGNOSIS — R109 Unspecified abdominal pain: Secondary | ICD-10-CM | POA: Diagnosis not present

## 2023-03-19 DIAGNOSIS — C9000 Multiple myeloma not having achieved remission: Secondary | ICD-10-CM | POA: Diagnosis not present

## 2023-03-19 DIAGNOSIS — R112 Nausea with vomiting, unspecified: Secondary | ICD-10-CM

## 2023-03-19 DIAGNOSIS — N281 Cyst of kidney, acquired: Secondary | ICD-10-CM | POA: Diagnosis not present

## 2023-03-19 DIAGNOSIS — R079 Chest pain, unspecified: Secondary | ICD-10-CM | POA: Diagnosis not present

## 2023-03-19 DIAGNOSIS — R1013 Epigastric pain: Secondary | ICD-10-CM | POA: Diagnosis not present

## 2023-03-19 DIAGNOSIS — R1030 Lower abdominal pain, unspecified: Secondary | ICD-10-CM

## 2023-03-19 DIAGNOSIS — R0602 Shortness of breath: Secondary | ICD-10-CM | POA: Diagnosis not present

## 2023-03-19 DIAGNOSIS — G8929 Other chronic pain: Secondary | ICD-10-CM | POA: Diagnosis not present

## 2023-03-22 ENCOUNTER — Telehealth: Payer: Self-pay

## 2023-03-22 ENCOUNTER — Telehealth: Payer: Self-pay | Admitting: Gastroenterology

## 2023-03-22 NOTE — Telephone Encounter (Signed)
Patient called stating that she was at the ED on 05/10 for having diarrhea 4 time a day. States her records from the hospital was sent over to Dr. Chales Abrahams so she can been as soon as possible. Advised her the next available with Dr. Chales Abrahams is in August. She is requesting a call back to discuss this.

## 2023-03-22 NOTE — Telephone Encounter (Signed)
Pt stated that she went to the ED for Diarrhea and requested a follow up appointment: Pt was encouraged to drink lots of fluids:  Pt was scheduled for a follow up with Willette Cluster NP on 04/27/2023 at 1:30 PM. Pt made aware Pt verbalized understanding with all questions answered.

## 2023-03-23 DIAGNOSIS — K580 Irritable bowel syndrome with diarrhea: Secondary | ICD-10-CM | POA: Diagnosis not present

## 2023-03-23 DIAGNOSIS — R1084 Generalized abdominal pain: Secondary | ICD-10-CM | POA: Diagnosis not present

## 2023-03-24 ENCOUNTER — Inpatient Hospital Stay: Payer: 59

## 2023-03-24 ENCOUNTER — Inpatient Hospital Stay (HOSPITAL_BASED_OUTPATIENT_CLINIC_OR_DEPARTMENT_OTHER): Payer: 59 | Admitting: Hematology & Oncology

## 2023-03-24 ENCOUNTER — Encounter: Payer: Self-pay | Admitting: Hematology & Oncology

## 2023-03-24 ENCOUNTER — Other Ambulatory Visit: Payer: Self-pay

## 2023-03-24 VITALS — BP 157/88 | HR 87 | Temp 97.8°F | Resp 19 | Ht 66.0 in | Wt 192.0 lb

## 2023-03-24 DIAGNOSIS — Z8619 Personal history of other infectious and parasitic diseases: Secondary | ICD-10-CM | POA: Diagnosis not present

## 2023-03-24 DIAGNOSIS — R197 Diarrhea, unspecified: Secondary | ICD-10-CM | POA: Diagnosis not present

## 2023-03-24 DIAGNOSIS — C9000 Multiple myeloma not having achieved remission: Secondary | ICD-10-CM | POA: Diagnosis not present

## 2023-03-24 DIAGNOSIS — K580 Irritable bowel syndrome with diarrhea: Secondary | ICD-10-CM | POA: Diagnosis not present

## 2023-03-24 DIAGNOSIS — Z5189 Encounter for other specified aftercare: Secondary | ICD-10-CM | POA: Diagnosis not present

## 2023-03-24 DIAGNOSIS — D801 Nonfamilial hypogammaglobulinemia: Secondary | ICD-10-CM

## 2023-03-24 DIAGNOSIS — D500 Iron deficiency anemia secondary to blood loss (chronic): Secondary | ICD-10-CM

## 2023-03-24 DIAGNOSIS — E860 Dehydration: Secondary | ICD-10-CM

## 2023-03-24 LAB — CMP (CANCER CENTER ONLY)
ALT: 12 U/L (ref 0–44)
AST: 25 U/L (ref 15–41)
Albumin: 3.9 g/dL (ref 3.5–5.0)
Alkaline Phosphatase: 50 U/L (ref 38–126)
Anion gap: 8 (ref 5–15)
BUN: 14 mg/dL (ref 8–23)
CO2: 29 mmol/L (ref 22–32)
Calcium: 9.8 mg/dL (ref 8.9–10.3)
Chloride: 105 mmol/L (ref 98–111)
Creatinine: 0.59 mg/dL (ref 0.44–1.00)
GFR, Estimated: >60 mL/min (ref >60)
Glucose, Bld: 101 mg/dL — ABNORMAL HIGH (ref 70–99)
Potassium: 3.8 mmol/L (ref 3.5–5.1)
Sodium: 142 mmol/L (ref 135–145)
Total Bilirubin: 0.3 mg/dL (ref 0.3–1.2)
Total Protein: 9.4 g/dL — ABNORMAL HIGH (ref 6.5–8.1)

## 2023-03-24 LAB — CBC WITH DIFFERENTIAL (CANCER CENTER ONLY)
Abs Immature Granulocytes: 0 10*3/uL (ref 0.00–0.07)
Basophils Absolute: 0 10*3/uL (ref 0.0–0.1)
Basophils Relative: 1 %
Eosinophils Absolute: 0.1 10*3/uL (ref 0.0–0.5)
Eosinophils Relative: 3 %
HCT: 37.9 % (ref 36.0–46.0)
Hemoglobin: 12 g/dL (ref 12.0–15.0)
Immature Granulocytes: 0 %
Lymphocytes Relative: 43 %
Lymphs Abs: 0.8 10*3/uL (ref 0.7–4.0)
MCH: 28.6 pg (ref 26.0–34.0)
MCHC: 31.7 g/dL (ref 30.0–36.0)
MCV: 90.2 fL (ref 80.0–100.0)
Monocytes Absolute: 0.2 10*3/uL (ref 0.1–1.0)
Monocytes Relative: 11 %
Neutro Abs: 0.8 10*3/uL — ABNORMAL LOW (ref 1.7–7.7)
Neutrophils Relative %: 42 %
Platelet Count: 127 10*3/uL — ABNORMAL LOW (ref 150–400)
RBC: 4.2 MIL/uL (ref 3.87–5.11)
RDW: 13.4 % (ref 11.5–15.5)
WBC Count: 1.9 10*3/uL — ABNORMAL LOW (ref 4.0–10.5)
nRBC: 0 % (ref 0.0–0.2)

## 2023-03-24 LAB — LACTATE DEHYDROGENASE: LDH: 232 U/L — ABNORMAL HIGH (ref 98–192)

## 2023-03-24 MED ORDER — PEGFILGRASTIM-CBQV 6 MG/0.6ML ~~LOC~~ SOSY
6.0000 mg | PREFILLED_SYRINGE | Freq: Once | SUBCUTANEOUS | Status: AC
  Administered 2023-03-24: 6 mg via SUBCUTANEOUS
  Filled 2023-03-24: qty 0.6

## 2023-03-24 NOTE — Progress Notes (Signed)
Hematology and Oncology Follow Up Visit  Tami [name] [id number] November 28, 1955 67 y.o. 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  She has some problems with diarrhea.  Another problem she has is that her white cell count is going down.  Her white cell count is 1.9 now.  Her ANC is 900.  Will go ahead and give her a dose of she having CSF.  I am not sure why the diarrhea is becoming more of a problem.  I know that she is trying to see Gastroenterology.  I do not think she has an appointment with him for another month or so.  Her myeloma does seem to be going up slowly.  When we last saw her, the M spike was stable at 2.1 g/dL.  Her IgG level was 3500 mg/dL.  Her Kappa light chain was 11.2 mg/dL.  I am not sure if the myeloma is anything to do with her diarrhea.  I know that she has had a colonoscopy with biopsies which did not show any amyloid..  She still has a pacer in.  She is having no problems with this.  She has had no fever.  She has had no nausea or vomiting.  There is been no rashes.  She has had no bleeding.  There is been no urinary issues.  Overall, I would say performance status is probably ECOG 1.    Medications:  Allergies as of 03/24/2023       Reactions   Benazepril Anaphylaxis, Swelling, Hives   angioedema Throat and lip swelling   Ondansetron Hcl Hives   Redness and hives post IV admin on 07/05/17   Codeine Nausea And Vomiting   Morphine Hives   Redness and hives noted post IV admin on 07/05/17        Medication List        Accurate as of Mar 24, 2023 10:21 AM. If you have any questions, ask your nurse or doctor.          acyclovir 400 MG tablet Commonly known as: ZOVIRAX TAKE ONE (1) TABLET BY MOUTH TWO (2) TIMES DAILY   albuterol 108 (90 Base) MCG/ACT inhaler Commonly known as: VENTOLIN HFA Inhale 2 puffs into the lungs every 6 (six) hours as needed for wheezing or shortness of breath.   albuterol (2.5 MG/3ML) 0.083% nebulizer solution Commonly known as: PROVENTIL Take 3 mLs (2.5 mg total) by nebulization every 6 (six) hours as needed for wheezing or shortness of breath.   Align 4 MG Caps Take 1 capsule (4 mg total) by mouth daily.   amLODipine 5 MG tablet Commonly known as: NORVASC Take 1 tablet (5 mg total) by mouth daily.   aspirin EC 81 MG tablet Take 81 mg by mouth daily. Swallow whole.   Benefiber Powd Take 1 Dose by mouth in the morning, at noon, and at bedtime.   budesonide 0.5 MG/2ML nebulizer solution Commonly known as: Pulmicort Take 2 mLs (0.5 mg total) by nebulization 2 (two) times daily as needed (cough, wheezing or  shortness of breath).   cetirizine 10 MG tablet Commonly known as: ZYRTEC Take 10 mg by mouth at bedtime.   colestipol 5 g granules Commonly known as: Colestid Take 5 g by mouth 3 (three) times daily.   dicyclomine 10 MG capsule Commonly known as: BENTYL Take 1 capsule (10 mg total) by mouth 4 (four) times daily -  before meals and at bedtime.   fluticasone 50 MCG/ACT nasal spray Commonly known as: FLONASE Place 2 sprays into both nostrils daily.   furosemide 20 MG tablet Commonly known as: LASIX Take 20 mg by mouth daily as needed (fluid retention (feet swelling)).   hydrOXYzine 10 MG tablet Commonly known as: ATARAX TAKE ONE (1) TABLET BY MOUTH 3 TIMES DAILY AS NEEDED FOR ITCHING   hyoscyamine 0.125 MG SL tablet Commonly known as: LEVSIN SL Place 1 tablet (0.125 mg total) under the tongue every 4 (four) hours as needed.   loperamide 2 MG capsule Commonly known  as: IMODIUM Take 4 mg by mouth in the morning and at bedtime.   losartan 50 MG tablet Commonly known as: COZAAR Take 50 mg by mouth in the morning and at bedtime.   Magnesium Glycinate 100 MG Caps Take 200 mg by mouth at bedtime.   meclizine 12.5 MG tablet Commonly known as: ANTIVERT Take by mouth 2 (two) times daily as needed.   meloxicam 15 MG tablet Commonly known as: MOBIC Take 1 tablet by mouth daily.   metoprolol succinate 50 MG 24 hr tablet Commonly known as: TOPROL-XL Take 1 tablet (50 mg total) by mouth 2 (two) times daily.   nitrofurantoin (macrocrystal-monohydrate) 100 MG capsule Commonly known as: Macrobid Take 1 capsule (100 mg total) by mouth 2 (two) times daily.   nortriptyline 10 MG capsule Commonly known as: Pamelor Take 1 capsule (10 mg total) by mouth at bedtime. What changed: additional instructions   oxyCODONE-acetaminophen 5-325 MG tablet Commonly known as: PERCOCET/ROXICET Take by mouth.   potassium chloride SA 20 MEQ tablet Commonly known as: KLOR-CON M Take 1 tablet (20 mEq total) by mouth daily.   prochlorperazine 10 MG tablet Commonly known as: COMPAZINE Take 0.5 tablets (5 mg total) by mouth 2 (two) times daily as needed for nausea or vomiting.   Quintabs Tabs Take 1 tablet by mouth daily.   spironolactone 25 MG tablet Commonly known as: ALDACTONE Take 12.5 mg by mouth daily.   Vitamin D3 50 MCG (2000 UT) Tabs Take 2,000 Units by mouth in the morning.        Allergies:  Allergies  Allergen Reactions   Benazepril Anaphylaxis, Swelling and Hives    angioedema Throat and lip swelling   Ondansetron Hcl Hives    Redness and hives post IV admin on 07/05/17   Codeine Nausea And Vomiting   Morphine Hives    Redness and hives noted post IV admin on 07/05/17    Past Medical History, Surgical history, Social history, and Family History were reviewed and updated.  Review of Systems: Review of Systems  Constitutional: Negative.    HENT: Negative.    Eyes: Negative.   Respiratory: Negative.    Cardiovascular: Negative.   Gastrointestinal: Negative.   Genitourinary: Negative.   Musculoskeletal: Negative.   Skin: Negative.   Neurological: Negative.   Endo/Heme/Allergies: Negative.   Psychiatric/Behavioral: Negative.        Physical Exam:  height is 5\' 6"  (1.676 m) and weight is 192 lb (87.1 kg). Her oral temperature is 97.8 F (36.6 C). Her  blood pressure is 157/88 (abnormal) and her pulse is 87. Her respiration is 19 and oxygen saturation is 98%.   Wt Readings from Last 3 Encounters:  03/24/23 192 lb (87.1 kg)  03/16/23 191 lb 6.4 oz (86.8 kg)  03/15/23 193 lb (87.5 kg)    Physical Exam Vitals reviewed.  HENT:     Head: Normocephalic and atraumatic.  Eyes:     Pupils: Pupils are equal, round, and reactive to light.     Comments: Mild pallor of conjunctiva  Cardiovascular:     Rate and Rhythm: Normal rate and regular rhythm.     Heart sounds: Normal heart sounds.  Pulmonary:     Effort: Pulmonary effort is normal.     Breath sounds: Normal breath sounds.  Abdominal:     General: Bowel sounds are normal.     Palpations: Abdomen is soft.  Musculoskeletal:        General: No tenderness or deformity. Normal range of motion.     Cervical back: Normal range of motion.  Lymphadenopathy:     Cervical: No cervical adenopathy.  Skin:    General: Skin is warm and dry.     Findings: No erythema or rash.  Neurological:     Mental Status: She is alert and oriented to person, place, and time.  Psychiatric:        Behavior: Behavior normal.        Thought Content: Thought content normal.        Judgment: Judgment normal.     Lab Results  Component Value Date   WBC 1.9 (L) 03/24/2023   HGB 12.0 03/24/2023   HCT 37.9 03/24/2023   MCV 90.2 03/24/2023   PLT 127 (L) 03/24/2023   Lab Results  Component Value Date   FERRITIN 402 (H) 02/22/2023   IRON 66 02/22/2023   TIBC 346 02/22/2023   UIBC 280  02/22/2023   IRONPCTSAT 19 02/22/2023   Lab Results  Component Value Date   RETICCTPCT 1.2 11/27/2022   RBC 4.20 03/24/2023   RETICCTABS 32,640 07/16/2016   Lab Results  Component Value Date   KPAFRELGTCHN 111.8 (H) 02/22/2023   LAMBDASER 5.9 02/22/2023   KAPLAMBRATIO 18.95 (H) 02/22/2023   Lab Results  Component Value Date   IGGSERUM 3,105 (H) 02/22/2023   IGGSERUM 3,970 (H) 02/22/2023   IGA 177 02/22/2023   IGA 203 02/22/2023   IGMSERUM 131 02/22/2023   IGMSERUM 145 02/22/2023   Lab Results  Component Value Date   TOTALPROTELP 8.2 02/22/2023   ALBUMINELP 3.3 02/22/2023   A1GS 0.2 02/22/2023   A2GS 0.9 02/22/2023   BETS 1.1 02/22/2023   BETA2SER 0.5 07/16/2016   GAMS 2.8 (H) 02/22/2023   MSPIKE 2.1 (H) 02/22/2023   SPEI Comment 10/27/2022     Chemistry      Component Value Date/Time   NA 142 03/24/2023 0911   NA 143 11/08/2017 1127   NA 140 12/31/2016 1000   K 3.8 03/24/2023 0911   K 4.0 11/08/2017 1127   K 3.6 12/31/2016 1000   CL 105 03/24/2023 0911   CL 107 11/08/2017 1127   CO2 29 03/24/2023 0911   CO2 25 11/08/2017 1127   CO2 24 12/31/2016 1000   BUN 14 03/24/2023 0911   BUN 19 11/08/2017 1127   BUN 22.3 12/31/2016 1000   CREATININE 0.59 03/24/2023 0911   CREATININE 0.9 11/08/2017 1127   CREATININE 0.8 12/31/2016 1000      Component Value Date/Time  CALCIUM 9.8 03/24/2023 0911   CALCIUM 9.9 11/08/2017 1127   CALCIUM 9.5 12/31/2016 1000   ALKPHOS 50 03/24/2023 0911   ALKPHOS 44 11/08/2017 1127   ALKPHOS 51 12/31/2016 1000   AST 25 03/24/2023 0911   AST 28 12/31/2016 1000   ALT 12 03/24/2023 0911   ALT 36 11/08/2017 1127   ALT 16 12/31/2016 1000   BILITOT 0.3 03/24/2023 0911   BILITOT 0.46 12/31/2016 1000       Impression and Plan: Ms. Fullam is a very pleasant 67 yo African American female with a previous IgG kappa MGUS with progression to myeloma.   I noticed that her total protein was elevated.  This might be an indicator that these  are more active with the myeloma.  If so, we will have to figure out how we can try to treat this.  I know that she has had an incredible amount of difficulty with treatment in the past.  It seems as if what ever we treated her with, she just will get sick.  It would be interested to see if the diarrhea got better with treating the myeloma.  I just am disappointed that it takes so long to get into see Gastroenterology.  We will try to get her back to see [place] in another month or so.  If there are issues with respect to the myeloma numbers that we have to treat her, then we will get her in sooner.  We will give her G-CSF today.  We will see how this does with her white blood cell count.   Josph Macho, MD 5/15/202410:21 AM

## 2023-03-25 ENCOUNTER — Inpatient Hospital Stay: Payer: 59

## 2023-03-25 ENCOUNTER — Ambulatory Visit (HOSPITAL_BASED_OUTPATIENT_CLINIC_OR_DEPARTMENT_OTHER)
Admission: RE | Admit: 2023-03-25 | Discharge: 2023-03-25 | Disposition: A | Discharge status: Home / Self Care | Payer: 59 | Source: Ambulatory Visit | Attending: Family Medicine | Admitting: Family Medicine

## 2023-03-25 VITALS — BP 143/75 | HR 91 | Temp 99.3°F | Resp 18

## 2023-03-25 DIAGNOSIS — R197 Diarrhea, unspecified: Secondary | ICD-10-CM | POA: Diagnosis not present

## 2023-03-25 DIAGNOSIS — K7689 Other specified diseases of liver: Secondary | ICD-10-CM | POA: Diagnosis not present

## 2023-03-25 DIAGNOSIS — R1030 Lower abdominal pain, unspecified: Secondary | ICD-10-CM | POA: Insufficient documentation

## 2023-03-25 DIAGNOSIS — R112 Nausea with vomiting, unspecified: Secondary | ICD-10-CM | POA: Diagnosis not present

## 2023-03-25 DIAGNOSIS — D801 Nonfamilial hypogammaglobulinemia: Secondary | ICD-10-CM

## 2023-03-25 DIAGNOSIS — Z5189 Encounter for other specified aftercare: Secondary | ICD-10-CM | POA: Diagnosis not present

## 2023-03-25 DIAGNOSIS — C9000 Multiple myeloma not having achieved remission: Secondary | ICD-10-CM | POA: Diagnosis not present

## 2023-03-25 LAB — KAPPA/LAMBDA LIGHT CHAINS
Kappa free light chain: 118.5 mg/L — ABNORMAL HIGH (ref 3.3–19.4)
Kappa, lambda light chain ratio: 16.23 — ABNORMAL HIGH (ref 0.26–1.65)
Lambda free light chains: 7.3 mg/L (ref 5.7–26.3)

## 2023-03-25 MED ORDER — SODIUM CHLORIDE 0.9 % IV SOLN: Freq: Once | INTRAVENOUS | Status: AC

## 2023-03-26 ENCOUNTER — Encounter: Payer: Self-pay | Admitting: Hematology & Oncology

## 2023-03-26 ENCOUNTER — Encounter: Payer: Self-pay | Admitting: Licensed Clinical Social Worker

## 2023-03-26 DIAGNOSIS — R519 Headache, unspecified: Secondary | ICD-10-CM | POA: Diagnosis not present

## 2023-03-26 DIAGNOSIS — E860 Dehydration: Secondary | ICD-10-CM | POA: Diagnosis not present

## 2023-03-26 DIAGNOSIS — R000 Tachycardia, unspecified: Secondary | ICD-10-CM | POA: Diagnosis not present

## 2023-03-26 DIAGNOSIS — R197 Diarrhea, unspecified: Secondary | ICD-10-CM | POA: Diagnosis not present

## 2023-03-26 DIAGNOSIS — R079 Chest pain, unspecified: Secondary | ICD-10-CM | POA: Diagnosis not present

## 2023-03-26 DIAGNOSIS — R0602 Shortness of breath: Secondary | ICD-10-CM | POA: Diagnosis not present

## 2023-03-26 LAB — IGG, IGA, IGM
IgA: 182 mg/dL (ref 87–352)
IgG (Immunoglobin G), Serum: 3408 mg/dL — ABNORMAL HIGH (ref 586–1602)
IgM (Immunoglobulin M), Srm: 138 mg/dL (ref 26–217)

## 2023-03-26 NOTE — Patient Instructions (Signed)
  Patient was not contacted during this encounter.  LCSW collaborated with care team to accomplish patient's care plan goal   My Rinke, LCSW Social Work Care Coordination  [phone]  

## 2023-03-26 NOTE — Patient Outreach (Signed)
  Care Coordination  Follow Up Visit Note   03/26/2023 Name: Tami [name] MRN: [id number] DOB: [birth year]  Tami [name] is a 67 y.o. year old female who sees [name], MD for primary care. I  did not speak to patient during this encounter  What matters to the patients health and wellness today?  Getting personal care service  Conducted brief assessment, recommendations and relevant information discussed.   LCSW  collaborated with [facility] during this encounter  to assist with meeting patient's needs.  Marland Kitchen PCS assessment is scheduled for today.    Goals Addressed             This Visit's Progress    Get Personal Care Services       Activities and task to complete in order to accomplish goals.   Personal Care assessment is scheduled today with [facility] [phone]  We will discussed your symptoms of depression if you would like during our next call  We can also work on getting your MyChart set up.         SDOH assessments and interventions completed:  No   Care Coordination Interventions:  Yes, provided  Interventions Today    Flowsheet Row Most Recent Value  Chronic Disease   Chronic disease during today's visit Hypertension (HTN), Congestive Heart Failure (CHF)  General Interventions   General Interventions Discussed/Reviewed Communication with, Level of Care  Communication with --  [NCLIFT ref PCS application]  Level of Care Personal Care Services       Follow up plan:  will f/u with patient in weeks    Encounter Outcome:  Pt. Visit Completed   [name], LCSW Social Work Care Coordination  [facility] T[name] [employer] [phone]

## 2023-03-27 ENCOUNTER — Other Ambulatory Visit: Payer: Self-pay

## 2023-03-27 ENCOUNTER — Encounter (HOSPITAL_BASED_OUTPATIENT_CLINIC_OR_DEPARTMENT_OTHER): Payer: Self-pay

## 2023-03-27 ENCOUNTER — Observation Stay (HOSPITAL_BASED_OUTPATIENT_CLINIC_OR_DEPARTMENT_OTHER)
Admission: EM | Admit: 2023-03-27 | Discharge: 2023-03-29 | Disposition: A | Discharge status: Home / Self Care | Payer: 59 | Attending: Family Medicine | Admitting: Family Medicine

## 2023-03-27 ENCOUNTER — Emergency Department (HOSPITAL_BASED_OUTPATIENT_CLINIC_OR_DEPARTMENT_OTHER): Payer: 59

## 2023-03-27 DIAGNOSIS — Z79899 Other long term (current) drug therapy: Secondary | ICD-10-CM | POA: Insufficient documentation

## 2023-03-27 DIAGNOSIS — I10 Essential (primary) hypertension: Secondary | ICD-10-CM | POA: Diagnosis present

## 2023-03-27 DIAGNOSIS — Z96651 Presence of right artificial knee joint: Secondary | ICD-10-CM | POA: Insufficient documentation

## 2023-03-27 DIAGNOSIS — C9000 Multiple myeloma not having achieved remission: Secondary | ICD-10-CM | POA: Insufficient documentation

## 2023-03-27 DIAGNOSIS — E876 Hypokalemia: Secondary | ICD-10-CM

## 2023-03-27 DIAGNOSIS — D500 Iron deficiency anemia secondary to blood loss (chronic): Secondary | ICD-10-CM | POA: Diagnosis not present

## 2023-03-27 DIAGNOSIS — R42 Dizziness and giddiness: Secondary | ICD-10-CM | POA: Insufficient documentation

## 2023-03-27 DIAGNOSIS — K219 Gastro-esophageal reflux disease without esophagitis: Secondary | ICD-10-CM | POA: Diagnosis present

## 2023-03-27 DIAGNOSIS — R112 Nausea with vomiting, unspecified: Principal | ICD-10-CM | POA: Diagnosis present

## 2023-03-27 DIAGNOSIS — Z950 Presence of cardiac pacemaker: Secondary | ICD-10-CM | POA: Diagnosis not present

## 2023-03-27 DIAGNOSIS — Z8673 Personal history of transient ischemic attack (TIA), and cerebral infarction without residual deficits: Secondary | ICD-10-CM | POA: Diagnosis not present

## 2023-03-27 DIAGNOSIS — I110 Hypertensive heart disease with heart failure: Secondary | ICD-10-CM | POA: Insufficient documentation

## 2023-03-27 DIAGNOSIS — R519 Headache, unspecified: Secondary | ICD-10-CM | POA: Diagnosis not present

## 2023-03-27 DIAGNOSIS — I5042 Chronic combined systolic (congestive) and diastolic (congestive) heart failure: Secondary | ICD-10-CM | POA: Diagnosis present

## 2023-03-27 DIAGNOSIS — R000 Tachycardia, unspecified: Secondary | ICD-10-CM | POA: Diagnosis not present

## 2023-03-27 DIAGNOSIS — E785 Hyperlipidemia, unspecified: Secondary | ICD-10-CM | POA: Diagnosis present

## 2023-03-27 DIAGNOSIS — D696 Thrombocytopenia, unspecified: Secondary | ICD-10-CM | POA: Diagnosis present

## 2023-03-27 DIAGNOSIS — G44209 Tension-type headache, unspecified, not intractable: Secondary | ICD-10-CM | POA: Diagnosis present

## 2023-03-27 LAB — CBC WITH DIFFERENTIAL/PLATELET
Abs Immature Granulocytes: 2.33 K/uL — ABNORMAL HIGH (ref 0.00–0.07)
Basophils Absolute: 0 K/uL (ref 0.0–0.1)
Basophils Relative: 0 %
Eosinophils Absolute: 0.1 K/uL (ref 0.0–0.5)
Eosinophils Relative: 0 %
HCT: 38.9 % (ref 36.0–46.0)
Hemoglobin: 12.5 g/dL (ref 12.0–15.0)
Immature Granulocytes: 13 %
Lymphocytes Relative: 7 %
Lymphs Abs: 1.3 K/uL (ref 0.7–4.0)
MCH: 28.4 pg (ref 26.0–34.0)
MCHC: 32.1 g/dL (ref 30.0–36.0)
MCV: 88.4 fL (ref 80.0–100.0)
Monocytes Absolute: 2.1 K/uL — ABNORMAL HIGH (ref 0.1–1.0)
Monocytes Relative: 12 %
Neutro Abs: 12.7 K/uL — ABNORMAL HIGH (ref 1.7–7.7)
Neutrophils Relative %: 68 %
Platelets: 110 K/uL — ABNORMAL LOW (ref 150–400)
RBC: 4.4 MIL/uL (ref 3.87–5.11)
RDW: 13.9 % (ref 11.5–15.5)
WBC Morphology: INCREASED
WBC: 18.5 K/uL — ABNORMAL HIGH (ref 4.0–10.5)
nRBC: 0 % (ref 0.0–0.2)

## 2023-03-27 LAB — COMPREHENSIVE METABOLIC PANEL
ALT: 18 U/L (ref 0–44)
AST: 38 U/L (ref 15–41)
Albumin: 3.6 g/dL (ref 3.5–5.0)
Alkaline Phosphatase: 130 U/L — ABNORMAL HIGH (ref 38–126)
Anion gap: 13 (ref 5–15)
BUN: 10 mg/dL (ref 8–23)
CO2: 24 mmol/L (ref 22–32)
Calcium: 9.1 mg/dL (ref 8.9–10.3)
Chloride: 99 mmol/L (ref 98–111)
Creatinine, Ser: 0.62 mg/dL (ref 0.44–1.00)
GFR, Estimated: >60 mL/min (ref >60)
Glucose, Bld: 127 mg/dL — ABNORMAL HIGH (ref 70–99)
Potassium: 2.8 mmol/L — ABNORMAL LOW (ref 3.5–5.1)
Sodium: 136 mmol/L (ref 135–145)
Total Bilirubin: 0.5 mg/dL (ref 0.3–1.2)
Total Protein: 9.8 g/dL — ABNORMAL HIGH (ref 6.5–8.1)

## 2023-03-27 LAB — LIPASE, BLOOD: Lipase: 27 U/L (ref 11–51)

## 2023-03-27 LAB — MAGNESIUM: Magnesium: 1.6 mg/dL — ABNORMAL LOW (ref 1.7–2.4)

## 2023-03-27 LAB — TROPONIN I (HIGH SENSITIVITY)
Troponin I (High Sensitivity): 35 ng/L — ABNORMAL HIGH
Troponin I (High Sensitivity): 38 ng/L — ABNORMAL HIGH (ref <18)

## 2023-03-27 MED ORDER — DIPHENHYDRAMINE HCL 50 MG/ML IJ SOLN
12.5000 mg | Freq: Once | INTRAMUSCULAR | Status: AC
  Administered 2023-03-27: 12.5 mg via INTRAVENOUS
  Filled 2023-03-27: qty 1

## 2023-03-27 MED ORDER — MAGNESIUM SULFATE 2 GM/50ML IV SOLN
2.0000 g | Freq: Once | INTRAVENOUS | Status: AC
  Administered 2023-03-27: 2 g via INTRAVENOUS
  Filled 2023-03-27: qty 50

## 2023-03-27 MED ORDER — POTASSIUM CHLORIDE 10 MEQ/100ML IV SOLN
10.0000 meq | INTRAVENOUS | Status: AC
  Administered 2023-03-27 (×3): 10 meq via INTRAVENOUS
  Filled 2023-03-27 (×3): qty 100

## 2023-03-27 MED ORDER — POTASSIUM CHLORIDE 20 MEQ PO PACK
60.0000 meq | PACK | Freq: Once | ORAL | Status: AC
  Administered 2023-03-27: 60 meq via ORAL
  Filled 2023-03-27: qty 3

## 2023-03-27 MED ORDER — LOSARTAN POTASSIUM 25 MG PO TABS
50.0000 mg | ORAL_TABLET | Freq: Once | ORAL | Status: AC
  Administered 2023-03-27: 50 mg via ORAL
  Filled 2023-03-27: qty 2

## 2023-03-27 MED ORDER — AMLODIPINE BESYLATE 5 MG PO TABS
5.0000 mg | ORAL_TABLET | Freq: Once | ORAL | Status: AC
  Administered 2023-03-27: 5 mg via ORAL
  Filled 2023-03-27: qty 1

## 2023-03-27 MED ORDER — ACETAMINOPHEN 500 MG PO TABS
1000.0000 mg | ORAL_TABLET | Freq: Once | ORAL | Status: AC
  Administered 2023-03-27: 1000 mg via ORAL
  Filled 2023-03-27: qty 2

## 2023-03-27 MED ORDER — DEXAMETHASONE SODIUM PHOSPHATE 10 MG/ML IJ SOLN
10.0000 mg | Freq: Once | INTRAMUSCULAR | Status: AC
  Administered 2023-03-27: 10 mg via INTRAVENOUS
  Filled 2023-03-27: qty 1

## 2023-03-27 MED ORDER — PROCHLORPERAZINE EDISYLATE 10 MG/2ML IJ SOLN
10.0000 mg | Freq: Once | INTRAMUSCULAR | Status: AC
  Administered 2023-03-27: 10 mg via INTRAVENOUS
  Filled 2023-03-27: qty 2

## 2023-03-27 MED ORDER — LACTATED RINGERS IV BOLUS
1000.0000 mL | Freq: Once | INTRAVENOUS | Status: AC
  Administered 2023-03-27: 1000 mL via INTRAVENOUS

## 2023-03-27 MED ORDER — LABETALOL HCL 5 MG/ML IV SOLN: 10.0000 mg | Freq: Once | INTRAVENOUS | Status: DC | PRN

## 2023-03-27 NOTE — ED Notes (Signed)
Pt care taken, obtained blood cultures, assisted to bathroom, no other complaints.

## 2023-03-27 NOTE — ED Triage Notes (Addendum)
Patient has a headache and nausea. She was seen at [place] for same yesterday. She is also complaining of dizziness. No fever. Patient denied any injuries.  She is also complaining of diarrhea for two years.

## 2023-03-27 NOTE — ED Provider Notes (Signed)
[place] [facility] AT [facility] Provider Note   CSN: [id number] Arrival date & time: 03/27/23  1454     History  Chief Complaint  Patient presents with   Headache    Tami [name] is a 67 y.o. female.  With PMH of multiple myeloma s/p Pegfilgastrim infusion 03/24/23, HTN, GERD, chronic diarrhea who presents with headache and nausea today ongoing for the past 2 days.  Patient's headache was not severe or thunderclap in onset but notes he has been worsening over the past 2 days.  She is complaining of pain in her bifrontal head as well as in her face.  She feels throbbing and aching pain.  She has also had associated nausea and nonbloody nonbilious emesis.  She later developed upper epigastrium pain with the vomiting.  She has had no fevers or chills.  She has not been having any relief with Tylenol or ibuprofen.  She has not had a headache like this before.  She has had no rash.  She has had no neck pain.  She has had no focal weakness, no numbness or tingling, no slurred speech or difficulty ambulating.  Denies any jaw claudication.  Of note has chronic ongoing nonbloody nonmelanotic diarrhea.  Decreased p.o. intake associated.  Was just seen in ED yesterday had a CTA PE study negative.   Headache      Home Medications Prior to Admission medications   Medication Sig Start Date End Date Taking? Authorizing Provider  acyclovir (ZOVIRAX) 400 MG tablet TAKE ONE (1) TABLET BY MOUTH TWO (2) TIMES DAILY 08/29/22   Josph Macho, MD  albuterol (PROVENTIL) (2.5 MG/3ML) 0.083% nebulizer solution Take 3 mLs (2.5 mg total) by nebulization every 6 (six) hours as needed for wheezing or shortness of breath. 12/15/22   [name], MD  albuterol (VENTOLIN HFA) 108 (90 Base) MCG/ACT inhaler Inhale 2 puffs into the lungs every 6 (six) hours as needed for wheezing or shortness of breath. 12/07/22   [name], MD  amLODipine (NORVASC) 5 MG tablet Take 1 tablet (5 mg  total) by mouth daily. 03/16/23   [name], MD  aspirin EC 81 MG tablet Take 81 mg by mouth daily. Swallow whole.    [provider]  budesonide (PULMICORT) 0.5 MG/2ML nebulizer solution Take 2 mLs (0.5 mg total) by nebulization 2 (two) times daily as needed (cough, wheezing or shortness of breath). 12/31/22   Martina Sinner, MD  cetirizine (ZYRTEC) 10 MG tablet Take 10 mg by mouth at bedtime.    [provider]  Cholecalciferol (VITAMIN D3) 50 MCG (2000 UT) TABS Take 2,000 Units by mouth in the morning.    [provider]  colestipol (COLESTID) 5 g granules Take 5 g by mouth 3 (three) times daily. 03/16/23   [name], MD  dicyclomine (BENTYL) 10 MG capsule Take 1 capsule (10 mg total) by mouth 4 (four) times daily -  before meals and at bedtime. 01/25/23 04/25/23  Judyann Munson, MD  fluticasone (FLONASE) 50 MCG/ACT nasal spray Place 2 sprays into both nostrils daily. 01/07/20   Joy, Shawn C, PA-C  furosemide (LASIX) 20 MG tablet Take 20 mg by mouth daily as needed (fluid retention (feet swelling)). 07/03/20   [provider]  hydrOXYzine (ATARAX) 10 MG tablet TAKE ONE (1) TABLET BY MOUTH 3 TIMES DAILY AS NEEDED FOR ITCHING 03/03/23   [name], MD  hyoscyamine (LEVSIN SL) 0.125 MG SL tablet Place  1 tablet (0.125 mg total) under the tongue every 4 (four) hours as needed. 05/21/22   [name], MD  loperamide (IMODIUM) 2 MG capsule Take 4 mg by mouth in the morning and at bedtime.    [provider]  losartan (COZAAR) 50 MG tablet Take 50 mg by mouth in the morning and at bedtime. 04/14/21   [provider]  Magnesium Glycinate 100 MG CAPS Take 200 mg by mouth at bedtime. 09/21/22   [provider]  meclizine (ANTIVERT) 12.5 MG tablet Take by mouth 2 (two) times daily as needed. 01/28/22   [provider]  meloxicam (MOBIC) 15 MG tablet Take 1 tablet by mouth daily. 12/31/22   [provider]  metoprolol  succinate (TOPROL-XL) 50 MG 24 hr tablet Take 1 tablet (50 mg total) by mouth 2 (two) times daily. Patient taking differently: Take 50 mg by mouth daily. 02/23/23   [name], MD  Multiple Vitamin Brandt Loosen) TABS Take 1 tablet by mouth daily. 11/06/21   [provider]  nitrofurantoin, macrocrystal-monohydrate, (MACROBID) 100 MG capsule Take 1 capsule (100 mg total) by mouth 2 (two) times daily. 02/25/23   [name], MD  nortriptyline (PAMELOR) 10 MG capsule Take 1 capsule (10 mg total) by mouth at bedtime. Patient taking differently: Take 10 mg by mouth at bedtime. As needed 01/12/23   [name], MD  oxyCODONE-acetaminophen (PERCOCET/ROXICET) 5-325 MG tablet Take by mouth. 11/07/21   [provider]  potassium chloride SA (KLOR-CON M) 20 MEQ tablet Take 1 tablet (20 mEq total) by mouth daily. 01/15/23   [name], MD  Probiotic Product (ALIGN) 4 MG CAPS Take 1 capsule (4 mg total) by mouth daily. 03/16/23   [name], MD  prochlorperazine (COMPAZINE) 10 MG tablet Take 0.5 tablets (5 mg total) by mouth 2 (two) times daily as needed for nausea or vomiting. 03/16/23   [name], MD  spironolactone (ALDACTONE) 25 MG tablet Take 12.5 mg by mouth daily.    [provider]  Wheat Dextrin (BENEFIBER) POWD Take 1 Dose by mouth in the morning, at noon, and at bedtime. 03/16/23 04/15/23  [name], MD      Allergies    Benazepril, Ondansetron hcl, Codeine, and Morphine    Review of Systems   Review of Systems  Neurological:  Positive for headaches.    Physical Exam Updated Vital Signs BP (!) 157/89 (BP Location: Left Arm)   Pulse 89   Temp 98.7 F (37.1 C) (Oral)   Resp 19   Ht 5\' 6"  (1.676 m)   Wt 87 kg   SpO2 99%   BMI 30.96 kg/m  Physical Exam Constitutional: Alert and oriented.  Uncomfortable appearing woman holding her head and face Eyes: Conjunctivae are normal. ENT      Head: Normocephalic and atraumatic.  No temporal  tenderness to palpation and no palpable temporal artery or abnormality      Neck: No meningismus. Cardiovascular: Tachycardic, regular rhythm Respiratory: Normal respiratory effort.  O2 sat 94 on RA Gastrointestinal: Soft and mild epigastric tenderness no rebound or guarding Musculoskeletal: Normal range of motion in all extremities.      Right lower leg: No tenderness or edema.      Left lower leg: No tenderness or edema. Neurologic: Normal speech and language.  CN II through XII grossly intact.  Normal finger-nose bilaterally.  5 out of 5 strength bilateral upper and lower extremities.  Sensation grossly  intact.  Steady ambulatory gait.  No gross focal neurologic deficits are appreciated. Skin: Skin is warm, dry and intact. No rash noted. Psychiatric: Mood and affect are normal. Speech and behavior are normal.  ED Results / Procedures / Treatments   Labs (all labs ordered are listed, but only abnormal results are displayed) Labs Reviewed  COMPREHENSIVE METABOLIC PANEL - Abnormal; Notable for the following components:      Result Value   Potassium 2.8 (*)    Glucose, Bld 127 (*)    Total Protein 9.8 (*)    Alkaline Phosphatase 130 (*)    All other components within normal limits  CBC WITH DIFFERENTIAL/PLATELET - Abnormal; Notable for the following components:   WBC 18.5 (*)    Platelets 110 (*)    Neutro Abs 12.7 (*)    Monocytes Absolute 2.1 (*)    Abs Immature Granulocytes 2.33 (*)    All other components within normal limits  MAGNESIUM - Abnormal; Notable for the following components:   Magnesium 1.6 (*)    All other components within normal limits  CBC - Abnormal; Notable for the following components:   WBC 25.4 (*)    Hemoglobin 11.3 (*)    HCT 35.5 (*)    Platelets 103 (*)    All other components within normal limits  COMPREHENSIVE METABOLIC PANEL - Abnormal; Notable for the following components:   Glucose, Bld 119 (*)    Calcium 8.8 (*)    Total Protein 8.9 (*)     Albumin 3.3 (*)    AST 46 (*)    All other components within normal limits  TROPONIN I (HIGH SENSITIVITY) - Abnormal; Notable for the following components:   Troponin I (High Sensitivity) 35 (*)    All other components within normal limits  TROPONIN I (HIGH SENSITIVITY) - Abnormal; Notable for the following components:   Troponin I (High Sensitivity) 38 (*)    All other components within normal limits  CULTURE, BLOOD (ROUTINE X 2)  CULTURE, BLOOD (ROUTINE X 2)  LIPASE, BLOOD  PHOSPHORUS    EKG EKG Interpretation  Date/Time:  Saturday Mar 27 2023 15:13:15 EDT Ventricular Rate:  113 PR Interval:  105 QRS Duration: 158 QT Interval:  383 QTC Calculation: 526 R Axis:   -89 Text Interpretation: Sinus tachycardia Nonspecific IVCD with LAD LVH with secondary repolarization abnormality HEART RATE INCREASED SINCE Confirmed by Vivien Rossetti ([id number]) on 03/27/2023 3:16:02 PM Also confirmed by Vivien Rossetti ([id number]), editor Stetler, Angela [phone])  on 03/28/2023 8:24:41 AM  Radiology CT Head Wo Contrast  Result Date: 03/27/2023 CLINICAL DATA:  Severe headache and nausea beginning yesterday. Dizziness. EXAM: CT HEAD WITHOUT CONTRAST TECHNIQUE: Contiguous axial images were obtained from the base of the skull through the vertex without intravenous contrast. RADIATION DOSE REDUCTION: This exam was performed according to the departmental dose-optimization program which includes automated exposure control, adjustment of the mA and/or kV according to patient size and/or use of iterative reconstruction technique. COMPARISON:  01/10/2023 FINDINGS: Brain: No evidence of intracranial hemorrhage, acute infarction, hydrocephalus, extra-axial collection, or mass lesion/mass effect. Vascular:  No hyperdense vessel or other acute findings. Skull: No evidence of fracture or other significant bone abnormality. Sinuses/Orbits:  No acute findings. Other: None. IMPRESSION: Negative noncontrast head CT. Electronically  Signed   By: Danae Orleans M.D.   On: 03/27/2023 17:59    Procedures Procedures    Medications Ordered in ED Medications  Oral care mouth rinse (has no administration in time range)  promethazine (PHENERGAN) 12.5 mg in sodium chloride 0.9 % 50 mL IVPB (has no administration in time range)  enoxaparin (LOVENOX) injection 40 mg (has no administration in time range)  acetaminophen (TYLENOL) tablet 650 mg (has no administration in time range)    Or  acetaminophen (TYLENOL) suppository 650 mg (has no administration in time range)  metoprolol succinate (TOPROL-XL) 24 hr tablet 50 mg (50 mg Oral Given 03/28/23 1017)  fentaNYL (SUBLIMAZE) injection 25 mcg (has no administration in time range)  ketorolac (TORADOL) 15 MG/ML injection 15 mg (has no administration in time range)  albuterol (PROVENTIL) (2.5 MG/3ML) 0.083% nebulizer solution 2.5 mg (has no administration in time range)  amLODipine (NORVASC) tablet 5 mg (has no administration in time range)  aspirin EC tablet 81 mg (has no administration in time range)  budesonide (PULMICORT) nebulizer solution 0.5 mg (has no administration in time range)  colestipol (COLESTID) packet 5 g (has no administration in time range)  dicyclomine (BENTYL) capsule 10 mg (has no administration in time range)  fluticasone (FLONASE) 50 MCG/ACT nasal spray 2 spray (has no administration in time range)  furosemide (LASIX) tablet 20 mg (has no administration in time range)  hydrOXYzine (ATARAX) tablet 10 mg (has no administration in time range)  loperamide (IMODIUM) capsule 4 mg (has no administration in time range)  hyoscyamine (LEVSIN SL) SL tablet 0.125 mg (has no administration in time range)  losartan (COZAAR) tablet 50 mg (has no administration in time range)  Magnesium Glycinate CAPS 200 mg (has no administration in time range)  potassium chloride SA (KLOR-CON M) CR tablet 20 mEq (has no administration in time range)  nortriptyline (PAMELOR) capsule 10 mg (has  no administration in time range)  oxyCODONE-acetaminophen (PERCOCET/ROXICET) 5-325 MG per tablet 1 tablet (has no administration in time range)  dexamethasone (DECADRON) injection 10 mg (10 mg Intravenous Given 03/27/23 1814)  lactated ringers bolus 1,000 mL (0 mLs Intravenous Stopped 03/27/23 2305)  acetaminophen (TYLENOL) tablet 1,000 mg (1,000 mg Oral Given 03/27/23 1812)  prochlorperazine (COMPAZINE) injection 10 mg (10 mg Intravenous Given 03/27/23 1809)  diphenhydrAMINE (BENADRYL) injection 12.5 mg (12.5 mg Intravenous Given 03/27/23 1807)  amLODipine (NORVASC) tablet 5 mg (5 mg Oral Given 03/27/23 1922)  losartan (COZAAR) tablet 50 mg (50 mg Oral Given 03/27/23 1922)  magnesium sulfate IVPB 2 g 50 mL (0 g Intravenous Stopped 03/27/23 2051)  potassium chloride 10 mEq in 100 mL IVPB (0 mEq Intravenous Stopped 03/27/23 2305)  potassium chloride (KLOR-CON) packet 60 mEq (60 mEq Oral Given 03/27/23 1921)  pantoprazole (PROTONIX) injection 40 mg (40 mg Intravenous Given 03/28/23 1017)  acetaminophen (TYLENOL) tablet 1,000 mg (1,000 mg Oral Given 03/28/23 1610)    ED Course/ Medical Decision Making/ A&P Clinical Course as of 03/28/23 1233  Sat Mar 27, 2023  1957 Spoke with Dr. Arlean Hopping regarding admission for hypertensive urgency, hypokalemia hypomagnesemia, demand ischemia in the setting of uncontrolled high blood pressure.  Headache has improved with migraine cocktail.  No focal deficits present.  No encephalopathy present concerning for pres.  However if worsening in hospital may require MRI without contrast which we currently cannot offer.  We will hold off from any fluids at this time.  I have ordered for IV antihypertensives. [VB]    Clinical Course User Index [VB] Mardene Sayer, MD                             Medical Decision Making Elease Hashimoto  A Marler is a 67 y.o. female.  With PMH of multiple myeloma s/p Pegfilgastrim infusion 03/24/23, HTN, GERD, chronic diarrhea who presents with headache  and nausea today ongoing for the past 2 days.  Suspect majority of patient's symptoms are likely side effects from Neulasta injection as symptoms all developed after Neulasta injection.  She was notably hypertensive 171/107 and tachycardic to 125 however there were no focal deficits on exam concerning for stroke or encephalopathy concerning for PR ES.  I obtained CT head without contrast which showed no ICH and I personally reviewed and confirmed.  And patient additionally improved with headache cocktail here in the ED.  Of note, patient's troponin slightly elevated today from baseline in the 30s.  Without any chest pain or shortness of breath however in the setting of hypertension, suspect demand ischemia from uncontrolled hypertension.  Started IV labetalol.  Also will notable for hypokalemia 2.8 and hypomagnesemia 1.6 likely from chronic fluid losses from chronic diarrhea, ordered for repletion.  Her abdominal exam is benign lipase normal, do not think she requires any abdominal imaging today.  Of note she does have a leukocytosis of 18.5 with left shift all likely secondary to Neulasta injection.  She has no meningismus or fevers or other infectious symptoms such as vomiting or rash, I do not think patient has meningitis.  Discussed with hospitalist Dr. Arlean Hopping for admission for continued management of symptoms and workup.  Amount and/or Complexity of Data Reviewed Labs: ordered. Radiology: ordered.  Risk OTC drugs. Prescription drug management. Decision regarding hospitalization.    Final Clinical Impression(s) / ED Diagnoses Final diagnoses:  Bad headache  Hypokalemia  Hypomagnesemia    Rx / DC Orders ED Discharge Orders     None         Mardene Sayer, MD 03/28/23 1233

## 2023-03-27 NOTE — Progress Notes (Addendum)
Hospitalist Transfer Note:  Transferring facility: [facility] Requesting provider: Dr. Vivien Rossetti (EDP at [facility]) Reason for transfer: admission for further evaluation and management of intractable n/v.     67 y.o.  female w/ h/o multiple myeloma, hypertension, chronic diarrhea, who presented to [facility] ED complaining of recurrent nausea/vomiting over the last 1 to 2 days associate with nonbloody, nonbilious emesis.  She has a history of multiple myeloma and underwent most recent Neulasta injection on 03/24/2023.  As a consequence of her persistent nausea over the last 1 to 2 days, she has been unable to tolerate her outpatient oral medications, including her outpatient p.o. antihypertensives.  She also notes a frontal headache over the course of the last day, not associate with any acute focal neurologic deficits.  She has a history of chronic diarrhea, without any worsening relative to this baseline.   Vital signs in the ED were notable for the following: Initial systolic blood pressures into the 190s, socially improving with IV and oral antihypertensive medications after dosing of IV antiemetics. Headache improving with improving blood pressure.   Imaging notable for CT head, which showed no evidence of acute process.  Subsequently, I accepted this patient for transfer for observation/ admission to a med/tele bed at [facility] or [facility] (first available) for further work-up and management of the above.       Nursing staff, Please call [facility] Admits & Consults System-Wide number on Amion [phone]) as soon as patient's arrival, so appropriate admitting provider can evaluate the pt.     Newton Pigg, DO Hospitalist

## 2023-03-27 NOTE — ED Notes (Signed)
Resting, no complaints at this time.

## 2023-03-28 DIAGNOSIS — K219 Gastro-esophageal reflux disease without esophagitis: Secondary | ICD-10-CM | POA: Diagnosis not present

## 2023-03-28 DIAGNOSIS — I5042 Chronic combined systolic (congestive) and diastolic (congestive) heart failure: Secondary | ICD-10-CM | POA: Diagnosis present

## 2023-03-28 DIAGNOSIS — C9000 Multiple myeloma not having achieved remission: Secondary | ICD-10-CM | POA: Diagnosis not present

## 2023-03-28 DIAGNOSIS — I110 Hypertensive heart disease with heart failure: Secondary | ICD-10-CM | POA: Diagnosis not present

## 2023-03-28 DIAGNOSIS — D500 Iron deficiency anemia secondary to blood loss (chronic): Secondary | ICD-10-CM | POA: Diagnosis not present

## 2023-03-28 DIAGNOSIS — E876 Hypokalemia: Secondary | ICD-10-CM | POA: Diagnosis not present

## 2023-03-28 DIAGNOSIS — Z950 Presence of cardiac pacemaker: Secondary | ICD-10-CM | POA: Diagnosis not present

## 2023-03-28 DIAGNOSIS — D696 Thrombocytopenia, unspecified: Secondary | ICD-10-CM | POA: Diagnosis not present

## 2023-03-28 DIAGNOSIS — Z79899 Other long term (current) drug therapy: Secondary | ICD-10-CM | POA: Diagnosis not present

## 2023-03-28 DIAGNOSIS — Z8673 Personal history of transient ischemic attack (TIA), and cerebral infarction without residual deficits: Secondary | ICD-10-CM | POA: Diagnosis not present

## 2023-03-28 DIAGNOSIS — R112 Nausea with vomiting, unspecified: Secondary | ICD-10-CM | POA: Diagnosis not present

## 2023-03-28 DIAGNOSIS — Z96651 Presence of right artificial knee joint: Secondary | ICD-10-CM | POA: Diagnosis not present

## 2023-03-28 DIAGNOSIS — R519 Headache, unspecified: Secondary | ICD-10-CM | POA: Diagnosis not present

## 2023-03-28 DIAGNOSIS — R42 Dizziness and giddiness: Secondary | ICD-10-CM | POA: Diagnosis not present

## 2023-03-28 LAB — PHOSPHORUS: Phosphorus: 2.8 mg/dL (ref 2.5–4.6)

## 2023-03-28 LAB — COMPREHENSIVE METABOLIC PANEL
ALT: 21 U/L (ref 0–44)
AST: 46 U/L — ABNORMAL HIGH (ref 15–41)
Albumin: 3.3 g/dL — ABNORMAL LOW (ref 3.5–5.0)
Alkaline Phosphatase: 124 U/L (ref 38–126)
Anion gap: 8 (ref 5–15)
BUN: 10 mg/dL (ref 8–23)
CO2: 22 mmol/L (ref 22–32)
Calcium: 8.8 mg/dL — ABNORMAL LOW (ref 8.9–10.3)
Chloride: 105 mmol/L (ref 98–111)
Creatinine, Ser: 0.74 mg/dL (ref 0.44–1.00)
GFR, Estimated: >60 mL/min (ref >60)
Glucose, Bld: 119 mg/dL — ABNORMAL HIGH (ref 70–99)
Potassium: 4.4 mmol/L (ref 3.5–5.1)
Sodium: 135 mmol/L (ref 135–145)
Total Bilirubin: 0.8 mg/dL (ref 0.3–1.2)
Total Protein: 8.9 g/dL — ABNORMAL HIGH (ref 6.5–8.1)

## 2023-03-28 LAB — CBC
HCT: 35.5 % — ABNORMAL LOW (ref 36.0–46.0)
Hemoglobin: 11.3 g/dL — ABNORMAL LOW (ref 12.0–15.0)
MCH: 28.5 pg (ref 26.0–34.0)
MCHC: 31.8 g/dL (ref 30.0–36.0)
MCV: 89.6 fL (ref 80.0–100.0)
Platelets: 103 10*3/uL — ABNORMAL LOW (ref 150–400)
RBC: 3.96 MIL/uL (ref 3.87–5.11)
RDW: 14.2 % (ref 11.5–15.5)
WBC: 25.4 10*3/uL — ABNORMAL HIGH (ref 4.0–10.5)
nRBC: 0.1 % (ref 0.0–0.2)

## 2023-03-28 LAB — CULTURE, BLOOD (ROUTINE X 2): Special Requests: ADEQUATE

## 2023-03-28 MED ORDER — ALBUTEROL SULFATE (2.5 MG/3ML) 0.083% IN NEBU: 2.5000 mg | INHALATION_SOLUTION | Freq: Four times a day (QID) | RESPIRATORY_TRACT | Status: DC | PRN

## 2023-03-28 MED ORDER — LOSARTAN POTASSIUM 50 MG PO TABS
50.0000 mg | ORAL_TABLET | Freq: Every day | ORAL | Status: DC
  Administered 2023-03-28 – 2023-03-29 (×2): 50 mg via ORAL
  Filled 2023-03-28 (×2): qty 1

## 2023-03-28 MED ORDER — NORTRIPTYLINE HCL 10 MG PO CAPS
10.0000 mg | ORAL_CAPSULE | Freq: Every day | ORAL | Status: DC
  Filled 2023-03-28: qty 1

## 2023-03-28 MED ORDER — AMLODIPINE BESYLATE 10 MG PO TABS
5.0000 mg | ORAL_TABLET | Freq: Every day | ORAL | Status: DC
  Administered 2023-03-28: 5 mg via ORAL
  Filled 2023-03-28 (×2): qty 1

## 2023-03-28 MED ORDER — FENTANYL CITRATE PF 50 MCG/ML IJ SOSY: 25.0000 ug | PREFILLED_SYRINGE | Freq: Once | INTRAMUSCULAR | Status: DC

## 2023-03-28 MED ORDER — PANTOPRAZOLE SODIUM 40 MG PO TBEC
40.0000 mg | DELAYED_RELEASE_TABLET | Freq: Every day | ORAL | Status: DC
  Administered 2023-03-29: 40 mg via ORAL
  Filled 2023-03-28: qty 1

## 2023-03-28 MED ORDER — BUDESONIDE 0.5 MG/2ML IN SUSP: 0.5000 mg | Freq: Two times a day (BID) | RESPIRATORY_TRACT | Status: DC | PRN

## 2023-03-28 MED ORDER — FUROSEMIDE 20 MG PO TABS: 20.0000 mg | ORAL_TABLET | Freq: Every day | ORAL | Status: DC | PRN

## 2023-03-28 MED ORDER — ORAL CARE MOUTH RINSE: 15.0000 mL | OROMUCOSAL | Status: DC | PRN

## 2023-03-28 MED ORDER — ACETAMINOPHEN 650 MG RE SUPP: 650.0000 mg | Freq: Four times a day (QID) | RECTAL | Status: DC | PRN

## 2023-03-28 MED ORDER — POTASSIUM CHLORIDE IN NACL 20-0.9 MEQ/L-% IV SOLN
INTRAVENOUS | Status: DC
  Filled 2023-03-28: qty 1000

## 2023-03-28 MED ORDER — COLESTIPOL HCL 5 G PO GRAN: 5.0000 g | GRANULES | Freq: Three times a day (TID) | ORAL | Status: DC

## 2023-03-28 MED ORDER — COLESTIPOL HCL 5 G PO PACK
5.0000 g | PACK | Freq: Three times a day (TID) | ORAL | Status: DC
  Administered 2023-03-28 – 2023-03-29 (×2): 5 g via ORAL
  Filled 2023-03-28 (×3): qty 1

## 2023-03-28 MED ORDER — OXYCODONE-ACETAMINOPHEN 5-325 MG PO TABS: 1.0000 | ORAL_TABLET | ORAL | Status: DC | PRN

## 2023-03-28 MED ORDER — ASPIRIN 81 MG PO TBEC
81.0000 mg | DELAYED_RELEASE_TABLET | Freq: Every day | ORAL | Status: DC
  Administered 2023-03-28 – 2023-03-29 (×2): 81 mg via ORAL
  Filled 2023-03-28 (×2): qty 1

## 2023-03-28 MED ORDER — ACETAMINOPHEN 325 MG PO TABS: 650.0000 mg | ORAL_TABLET | Freq: Four times a day (QID) | ORAL | Status: DC | PRN

## 2023-03-28 MED ORDER — MAGNESIUM GLYCINATE 100 MG PO CAPS: 200.0000 mg | ORAL_CAPSULE | Freq: Every day | ORAL | Status: DC

## 2023-03-28 MED ORDER — SODIUM CHLORIDE 0.9 % IV SOLN: 12.5000 mg | Freq: Four times a day (QID) | INTRAVENOUS | Status: DC | PRN

## 2023-03-28 MED ORDER — METOPROLOL SUCCINATE ER 50 MG PO TB24
50.0000 mg | ORAL_TABLET | Freq: Every day | ORAL | Status: DC
  Administered 2023-03-28 – 2023-03-29 (×2): 50 mg via ORAL
  Filled 2023-03-28 (×2): qty 1

## 2023-03-28 MED ORDER — DICYCLOMINE HCL 10 MG PO CAPS
10.0000 mg | ORAL_CAPSULE | Freq: Three times a day (TID) | ORAL | Status: DC
  Administered 2023-03-28 – 2023-03-29 (×4): 10 mg via ORAL
  Filled 2023-03-28 (×4): qty 1

## 2023-03-28 MED ORDER — LOPERAMIDE HCL 2 MG PO CAPS
4.0000 mg | ORAL_CAPSULE | Freq: Two times a day (BID) | ORAL | Status: DC
  Administered 2023-03-28 – 2023-03-29 (×2): 4 mg via ORAL
  Filled 2023-03-28 (×2): qty 2

## 2023-03-28 MED ORDER — KETOROLAC TROMETHAMINE 15 MG/ML IJ SOLN: 15.0000 mg | Freq: Once | INTRAMUSCULAR | Status: DC

## 2023-03-28 MED ORDER — POTASSIUM CHLORIDE CRYS ER 20 MEQ PO TBCR
20.0000 meq | EXTENDED_RELEASE_TABLET | Freq: Every day | ORAL | Status: DC
  Administered 2023-03-29: 20 meq via ORAL
  Filled 2023-03-28: qty 1

## 2023-03-28 MED ORDER — MAGNESIUM OXIDE -MG SUPPLEMENT 400 (240 MG) MG PO TABS
200.0000 mg | ORAL_TABLET | Freq: Every day | ORAL | Status: DC
  Administered 2023-03-28: 200 mg via ORAL
  Filled 2023-03-28: qty 1

## 2023-03-28 MED ORDER — FLUTICASONE PROPIONATE 50 MCG/ACT NA SUSP
2.0000 | Freq: Every day | NASAL | Status: DC
  Filled 2023-03-28: qty 16

## 2023-03-28 MED ORDER — FENTANYL CITRATE PF 50 MCG/ML IJ SOSY: 25.0000 ug | PREFILLED_SYRINGE | INTRAMUSCULAR | Status: DC | PRN

## 2023-03-28 MED ORDER — HYDROXYZINE HCL 10 MG PO TABS: 10.0000 mg | ORAL_TABLET | Freq: Four times a day (QID) | ORAL | Status: DC | PRN

## 2023-03-28 MED ORDER — PANTOPRAZOLE SODIUM 40 MG IV SOLR
40.0000 mg | Freq: Once | INTRAVENOUS | Status: AC
  Administered 2023-03-28: 40 mg via INTRAVENOUS
  Filled 2023-03-28: qty 10

## 2023-03-28 MED ORDER — ENOXAPARIN SODIUM 40 MG/0.4ML IJ SOSY
40.0000 mg | PREFILLED_SYRINGE | INTRAMUSCULAR | Status: DC
  Administered 2023-03-28: 40 mg via SUBCUTANEOUS
  Filled 2023-03-28: qty 0.4

## 2023-03-28 MED ORDER — KETOROLAC TROMETHAMINE 15 MG/ML IJ SOLN: 15.0000 mg | Freq: Once | INTRAMUSCULAR | Status: AC | PRN

## 2023-03-28 MED ORDER — ACETAMINOPHEN 500 MG PO TABS
1000.0000 mg | ORAL_TABLET | Freq: Once | ORAL | Status: AC
  Administered 2023-03-28: 1000 mg via ORAL
  Filled 2023-03-28: qty 2

## 2023-03-28 MED ORDER — HYOSCYAMINE SULFATE 0.125 MG SL SUBL: 0.1250 mg | SUBLINGUAL_TABLET | SUBLINGUAL | Status: DC | PRN

## 2023-03-28 NOTE — H&P (Signed)
History and Physical    Patient: Tami [name]:[id number] DOB: [birth year] DOA: 03/27/2023 DOS: the patient was seen and examined on 03/28/2023 PCP: [name], MD  Patient coming from: Home  Chief Complaint:  Chief Complaint  Patient presents with   Headache   HPI: Tami [name] is a 67 y.o. female with medical history significant of osteoarthritis, back pain, bowel obstruction, C. difficile diarrhea, chronic diastolic CHF, history of pacemaker placement, depression, GERD, epigastric abdominal pain, hyperlipidemia, hypertension, hypogammaglobulinemia, iron deficiency anemia due to chronic blood loss, monoclonal gammopathy of unknown significance, multiple myeloma, obstructive sleep apnea, history of stroke, history of TIA, history of UTI who presented to the emergency department with headache and nausea since Thursday.  Her appetite is decreased.  No diarrhea, constipation, melena or hematochezia.  No flank pain, dysuria, frequency or hematuria. She received Neulasta on the previous day and feels like it may have caused her these symptoms as side effects.  She has felt warm, but no fever, chills or night sweats. No sore throat, rhinorrhea, dyspnea, wheezing or hemoptysis.  No chest pain, palpitations, diaphoresis, PND, orthopnea or pitting edema of the lower extremities.   No polyuria, polydipsia, polyphagia or blurred vision.  ED course: Initial vital signs were temperature 98.4 F, pulse 108, respiration 18, BP 167/92 mmHg O2 sat 94% on room air.  The patient received acetaminophen 1000 mg x 1, amlodipine 5 mg p.o. x 1, dexamethasone 10 mg IVP x 1, Benadryl 12.5 mg IVP x 1, losartan 50 mg p.o. x 1, KCl 60 mg p.o. x 1, KCl 10 mEq IVP x 3, LR 1000 mL bolus and magnesium sulfate 2 g IVPB.  Lab work: CBC showed a white count 18.5, hemoglobin 12.5 g/dL platelets 811.  Troponin was 35 then 38 ng/L.  Lipase is normal.  Magnesium 1.6 mg/dL.  CMP showed potassium of 2.9 mmol/L, glucose 127  mg/dL, total protein 9.8 g/dL and alkaline phosphatase 130 units/L.  The rest of the CMP measurements were normal.  Imaging: CT head without contrast was negative.  Review of Systems: As mentioned in the history of present illness. All other systems reviewed and are negative. Past Medical History:  Diagnosis Date   Abnormal SPEP 07/26/2016   Arthritis    knees, hands   Back pain 07/14/2016   Bowel obstruction (HCC) 11/2014   C. 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   Multiple myeloma (HCC) 07/20/2018   Multiple myeloma not having achieved remission (HCC) 07/20/2018   Myalgia 12/31/2016   Obstructive sleep apnea 03/31/2015   Presence of permanent cardiac pacemaker    SOB (shortness of breath) 04/09/2016   Stroke (HCC)    TIAs   UTI (urinary tract infection)    Past Surgical History:  Procedure Laterality Date   ABDOMINAL HYSTERECTOMY     menorraghia, 2006, total   BIOPSY  09/01/2022   Procedure: BIOPSY;  Surgeon: Lynann Bologna, MD;  Location: [facility];  Service: Gastroenterology;;   [place]  COLONOSCOPY  2018   [facility] per patient   COLONOSCOPY WITH PROPOFOL N/A 09/01/2022   Procedure: COLONOSCOPY WITH PROPOFOL;  Surgeon: Lynann Bologna, MD;  Location: [facility];  Service: Gastroenterology;  Laterality: N/A;   ESOPHAGOGASTRODUODENOSCOPY  2018   [facility]    ESOPHAGOGASTRODUODENOSCOPY (EGD) WITH PROPOFOL N/A 09/01/2022   Procedure: ESOPHAGOGASTRODUODENOSCOPY (EGD) WITH  PROPOFOL;  Surgeon: Lynann Bologna, MD;  Location: [facility];  Service: Gastroenterology;  Laterality: N/A;   KNEE SURGERY     right, repair torn torn cartialage   PACEMAKER INSERTION  08/2014   TONSILLECTOMY     TUBAL LIGATION     Social History:  reports that she has never smoked. She has never been exposed to tobacco smoke. She has never used smokeless tobacco. She reports that she does not drink alcohol and does not use drugs.  Allergies  Allergen Reactions   Benazepril Anaphylaxis, Swelling and Hives    angioedema Throat and lip swelling   Ondansetron Hcl Hives    Redness and hives post IV admin on 07/05/17   Codeine Nausea And Vomiting   Morphine Hives    Redness and hives noted post IV admin on 07/05/17    Family History  Problem Relation Age of Onset   Diabetes Mother    Hypertension Mother    Heart disease Mother        s/p 1 stent   Hyperlipidemia Mother    Arthritis Mother    Cancer Father        COLON   Colon cancer Father 73   Irritable bowel syndrome Sister    Hyperlipidemia Daughter    Hypertension Daughter    Hypertension Maternal Grandmother    Arthritis Maternal Grandmother    Heart disease Maternal Grandfather        MI   Hypertension Maternal Grandfather    Arthritis Maternal Grandfather    Hypertension Son    Esophageal cancer Neg Hx     Prior to Admission medications   Medication Sig Start Date End Date Taking? Authorizing Provider  acyclovir (ZOVIRAX) 400 MG tablet TAKE ONE (1) TABLET BY MOUTH TWO (2) TIMES DAILY 08/29/22   Josph Macho, MD  albuterol (PROVENTIL) (2.5 MG/3ML) 0.083% nebulizer solution Take 3 mLs (2.5 mg total) by nebulization every 6 (six) hours as needed for wheezing or shortness of breath. 12/15/22   [name], MD  albuterol (VENTOLIN HFA) 108 (90 Base) MCG/ACT inhaler Inhale 2 puffs into the lungs every 6 (six) hours as needed for wheezing or shortness of breath. 12/07/22   [name], MD  amLODipine (NORVASC)  5 MG tablet Take 1 tablet (5 mg total) by mouth daily. 03/16/23   [name], MD  aspirin EC 81 MG tablet Take 81 mg by mouth daily. Swallow whole.    [provider]  budesonide (PULMICORT) 0.5 MG/2ML nebulizer solution Take 2 mLs (0.5 mg total) by nebulization 2 (two) times daily as needed (cough, wheezing or shortness of breath). 12/31/22   Martina Sinner, MD  cetirizine (ZYRTEC) 10 MG tablet Take 10 mg by mouth at bedtime.    [provider]  Cholecalciferol (VITAMIN D3) 50 MCG (2000 UT) TABS Take 2,000 Units by mouth in the morning.    [provider]  colestipol (COLESTID) 5 g granules Take 5 g by mouth 3 (three) times daily. 03/16/23   [name], MD  dicyclomine (BENTYL) 10 MG capsule Take 1 capsule (10 mg total) by mouth  4 (four) times daily -  before meals and at bedtime. 01/25/23 04/25/23  Judyann Munson, MD  fluticasone (FLONASE) 50 MCG/ACT nasal spray Place 2 sprays into both nostrils daily. 01/07/20   Joy, Shawn C, PA-C  furosemide (LASIX) 20 MG tablet Take 20 mg by mouth daily as needed (fluid retention (feet swelling)). 07/03/20   [provider]  hydrOXYzine (ATARAX) 10 MG tablet TAKE ONE (1) TABLET BY MOUTH 3 TIMES DAILY AS NEEDED FOR ITCHING 03/03/23   [name], MD  hyoscyamine (LEVSIN SL) 0.125 MG SL tablet Place 1 tablet (0.125 mg total) under the tongue every 4 (four) hours as needed. 05/21/22   [name], MD  loperamide (IMODIUM) 2 MG capsule Take 4 mg by mouth in the morning and at bedtime.    [provider]  losartan (COZAAR) 50 MG tablet Take 50 mg by mouth in the morning and at bedtime. 04/14/21   [provider]  Magnesium Glycinate 100 MG CAPS Take 200 mg by mouth at bedtime. 09/21/22   [provider]  meclizine (ANTIVERT) 12.5 MG tablet Take by mouth 2 (two) times daily as needed. 01/28/22   [provider]  meloxicam (MOBIC) 15 MG tablet Take 1 tablet by mouth daily. 12/31/22    [provider]  metoprolol succinate (TOPROL-XL) 50 MG 24 hr tablet Take 1 tablet (50 mg total) by mouth 2 (two) times daily. 02/23/23   [name], MD  Multiple Vitamin Brandt Loosen) TABS Take 1 tablet by mouth daily. 11/06/21   [provider]  nitrofurantoin, macrocrystal-monohydrate, (MACROBID) 100 MG capsule Take 1 capsule (100 mg total) by mouth 2 (two) times daily. 02/25/23   [name], MD  nortriptyline (PAMELOR) 10 MG capsule Take 1 capsule (10 mg total) by mouth at bedtime. Patient taking differently: Take 10 mg by mouth at bedtime. As needed 01/12/23   [name], MD  oxyCODONE-acetaminophen (PERCOCET/ROXICET) 5-325 MG tablet Take by mouth. 11/07/21   [provider]  potassium chloride SA (KLOR-CON M) 20 MEQ tablet Take 1 tablet (20 mEq total) by mouth daily. 01/15/23   [name], MD  Probiotic Product (ALIGN) 4 MG CAPS Take 1 capsule (4 mg total) by mouth daily. 03/16/23   [name], MD  prochlorperazine (COMPAZINE) 10 MG tablet Take 0.5 tablets (5 mg total) by mouth 2 (two) times daily as needed for nausea or vomiting. 03/16/23   [name], MD  spironolactone (ALDACTONE) 25 MG tablet Take 12.5 mg by mouth daily.    [provider]  Wheat Dextrin (BENEFIBER) POWD Take 1 Dose by mouth in the morning, at noon, and at bedtime. 03/16/23 04/15/23  [name], MD    Physical Exam: Vitals:   03/28/23 0047 03/28/23 0408 03/28/23 0431 03/28/23 0433  BP: (!) 169/94 (!) 150/84 (!) 150/86   Pulse: (!) 116 (!) 109 (!) 106   Resp: 20 20 19    Temp: 98.8 F (37.1 C) 98.8 F (37.1 C) 98.7 F (37.1 C)   TempSrc: Oral Oral Oral   SpO2: 97% 97% 93%   Weight:    87 kg  Height:    5\' 6"  (1.676 m)   Physical Exam Vitals and nursing note reviewed.  Constitutional:      Appearance: Normal appearance.  HENT:     Head: Normocephalic and atraumatic.     Nose: No rhinorrhea.     Mouth/Throat:     Mouth: Mucous membranes are dry.  Eyes:      General: No scleral icterus.    Pupils: Pupils are equal, round, and reactive to light.  Neck:     Vascular: No JVD.  Cardiovascular:     Rate and Rhythm: Regular rhythm. Tachycardia present.     Heart sounds: S1 normal and S2 normal.  Pulmonary:     Effort: Pulmonary effort is normal.     Breath sounds: Normal breath sounds. No wheezing, rhonchi or rales.  Abdominal:     General: Bowel sounds are normal.     Palpations: Abdomen is soft.  Musculoskeletal:     Cervical back: Neck supple.     Right lower leg: No edema.     Left lower leg: No edema.  Skin:    General: Skin is warm and dry.  Neurological:     General: No focal deficit present.     Mental Status: She is alert and oriented to person, place, and time.  Psychiatric:        Mood and Affect: Mood normal.        Behavior: Behavior normal.     Data Reviewed:  Results are pending, will review when available.  EKG: Vent. rate 115 BPM PR interval 102 ms QRS duration 148 ms QT/QTcB 379/525 ms P-R-T axes 81 -78 * Sinus tachycardia Ventricular premature complex Probable left atrial enlargement Nonspecific IVCD with LAD LVH with secondary repolarization abnormality  Out of network TTE 10/2022   Assessment and Plan: Principal Problem:   Intractable nausea and vomiting Observation/telemetry. Has tolerated oral intake. Hold IV fluids due to CHF. Analgesics as needed. Antiemetics as needed. Pantoprazole 40 mg IVP x 1. Pantoprazole 40 mg p.o. daily starting tomorrow. Follow CBC, CMP and lipase in AM.  Active Problems:   Tension headache Overall better, but not resolved. Continue analgesics as needed.    Hypokalemia Resolved. Follow-up potassium level.    Hypomagnesemia Supplemented. Follow-up level as needed. Continue magnesium glycinate supplement.    Chronic combined systolic and diastolic congestive heart failure (HCC) No signs of decompensation. Continue beta-blocker and losartan. Continue  furosemide as needed.    GERD (gastroesophageal reflux disease) On PPI as above.    Hyperlipidemia Currently not on therapy. Follow-up with PCP.    Hypertension Continue amlodipine 5 mg p.o. daily. Continue losartan 50 mg p.o. daily. Continue metoprolol succinate 50 mg p.o. daily.    Multiple myeloma not having achieved remission (HCC) Follow-up with Dr. Myna Hidalgo as scheduled.    Thrombocytopenia (HCC) Monitor platelet count.    Advance Care Planning:   Code Status: Full Code   Consults:   Family Communication:   Severity of Illness: The appropriate patient status for this patient is OBSERVATION. Observation status is judged to be reasonable and necessary in order to provide the required intensity of service to ensure the patient's safety. The patient's presenting symptoms, physical exam findings, and initial radiographic and laboratory data in the context of their medical condition is felt to place them at decreased risk for further clinical deterioration. Furthermore, it is anticipated that the patient will be medically stable for discharge from the hospital within 2 midnights of admission.   Author: Bobette Mo, MD 03/28/2023 7:46 AM  For on call review [email or web address].   This document was prepared using Dragon voice recognition software and may contain some unintended transcription errors.

## 2023-03-28 NOTE — Plan of Care (Signed)
°  Problem: Safety: °Goal: Ability to remain free from injury will improve °Outcome: Progressing °  °Problem: Pain Managment: °Goal: General experience of comfort will improve °Outcome: Progressing °  °Problem: Elimination: °Goal: Will not experience complications related to bowel motility °Outcome: Progressing °  °Problem: Coping: °Goal: Level of anxiety will decrease °Outcome: Progressing °  °

## 2023-03-28 NOTE — TOC CM/SW Note (Signed)
  Transition of Care [facility]) Screening Note   Patient Details  Name: Tami [name] Date of Birth: 1956-04-25   [facility] CM/SW Contact:    Howell Rucks, RN Phone Number: 03/28/2023, 4:05 PM    Transition of Care Department [facility]) has reviewed patient and no TOC needs have been identified at this time. We will continue to monitor patient advancement through interdisciplinary progression rounds. If new patient transition needs arise, please place a TOC consult.

## 2023-03-28 NOTE — Progress Notes (Signed)
Patient arrived to unit around 1am, via transport. RN, Inok, paged via Amion to have provider assigned to patient. This RN did not receive a call for provider assignment after a while, so this RN paged again via phone. This RN received a phone call stating that Dr. Joneen Roach would be assigned to patient. This RN notified patient that a provider had been assigned and should be here to see her shortly. This RN did come to check on patient while waiting for provider to come, patient did go off to sleep at one point. This RN reached out to Dr. Joneen Roach via secure chat concerning patient having a headache 9/10 and patient stating that she was hungry and wanted something to eat. Dr. Joneen Roach responded stating "Let bed placement know of this patient." This RN responded letting her know that bed placement had already been notified, but that I would notify them again. This RN did page again via phone to inform bed placement of Dr. Lajoyce Lauber statement and that I did speak with someone earlier about having a provider assigned for this patient. This RN was informed that the assignment would be sent out to Dr. Joneen Roach again. Shortly after, orders were entered for a Clear Liquid Diet, IV fluids, and PRN nausea medication. Patient informed of new orders. This RN did give patient chicken broth, ginger ale, and water. Patient stated she would wait on provider to come.   This RN received a call from the charge nurse stating that a family member called and was very upset that no one had been in the room with the patient. This RN went to speak with patient regarding the issue. Charge nurse did also speak with the patient about her concerns. This RN reached out to Dr. Robb Matar, whom was later listed as attending provider, to get orders for pain medication. This RN did also speak with [facility] who is the patient's medical POA. This RN did inform POA that the previous assigned provider did not place orders for pain medication, but  that I would reach out to the current attending provider about pain medication. Orders were placed for Tylenol and this RN did administer the medication. Diet was also changed from Clear Liquids to a Heart Healthy diet.

## 2023-03-28 NOTE — Plan of Care (Signed)
  Problem: Clinical Measurements: Goal: Diagnostic test results will improve Outcome: Progressing Goal: Cardiovascular complication will be avoided Outcome: Progressing   Problem: Activity: Goal: Risk for activity intolerance will decrease Outcome: Progressing   Problem: Nutrition: Goal: Adequate nutrition will be maintained Outcome: Progressing   Problem: Pain Managment: Goal: General experience of comfort will improve Outcome: Progressing   Problem: Safety: Goal: Ability to remain free from injury will improve Outcome: Progressing

## 2023-03-29 ENCOUNTER — Ambulatory Visit: Payer: 59 | Admitting: Internal Medicine

## 2023-03-29 DIAGNOSIS — R112 Nausea with vomiting, unspecified: Secondary | ICD-10-CM | POA: Diagnosis not present

## 2023-03-29 LAB — CBC
HCT: 35.3 % — ABNORMAL LOW (ref 36.0–46.0)
Hemoglobin: 11.2 g/dL — ABNORMAL LOW (ref 12.0–15.0)
MCH: 28.6 pg (ref 26.0–34.0)
MCHC: 31.7 g/dL (ref 30.0–36.0)
MCV: 90.1 fL (ref 80.0–100.0)
Platelets: 86 10*3/uL — ABNORMAL LOW (ref 150–400)
RBC: 3.92 MIL/uL (ref 3.87–5.11)
RDW: 14.4 % (ref 11.5–15.5)
WBC: 10.1 10*3/uL (ref 4.0–10.5)
nRBC: 0.2 % (ref 0.0–0.2)

## 2023-03-29 LAB — COMPREHENSIVE METABOLIC PANEL
ALT: 24 U/L (ref 0–44)
AST: 92 U/L — ABNORMAL HIGH (ref 15–41)
Albumin: 3.1 g/dL — ABNORMAL LOW (ref 3.5–5.0)
Alkaline Phosphatase: 84 U/L (ref 38–126)
Anion gap: 7 (ref 5–15)
BUN: 14 mg/dL (ref 8–23)
CO2: 25 mmol/L (ref 22–32)
Calcium: 8.5 mg/dL — ABNORMAL LOW (ref 8.9–10.3)
Chloride: 105 mmol/L (ref 98–111)
Creatinine, Ser: 0.73 mg/dL (ref 0.44–1.00)
GFR, Estimated: >60 mL/min (ref >60)
Glucose, Bld: 110 mg/dL — ABNORMAL HIGH (ref 70–99)
Potassium: 3.4 mmol/L — ABNORMAL LOW (ref 3.5–5.1)
Sodium: 137 mmol/L (ref 135–145)
Total Bilirubin: 0.5 mg/dL (ref 0.3–1.2)
Total Protein: 8.2 g/dL — ABNORMAL HIGH (ref 6.5–8.1)

## 2023-03-29 LAB — HIV ANTIBODY (ROUTINE TESTING W REFLEX): HIV Screen 4th Generation wRfx: NONREACTIVE

## 2023-03-29 NOTE — Consult Note (Signed)
Tami [name] is well-known to me.  She is very nice 67 year old Afro-American female.  She has IgG kappa myeloma.  She has had an incredibly hard time with treatments.  It seems that no matter what we have tried to treat her with, she has had toxicity.  Her last saw her back on May 15.  At that time, her white cell count was 1.9.  Her white cell count is been trending downward.  We did go ahead and give her some Neulasta.  I think this was given to her on Thursday or Friday.  She did get some IV fluid in the office.  Over the weekend, she had a lot of nausea and vomiting.  She has had this before.  She had diarrhea.  Is felt that this is all from the G-CSF.  With all the patient's we have given G-CSF 2, I cannot recall when he was had a lot of GI symptoms.  She has had a headache.  She was admitted on 03/27/2023.  When she was admitted, her sodium is 136.  Potassium 2.8.  BUN 10 creatinine 0.60.  Magnesium is 1.6.  Her white cell count was 18.5.  Hemoglobin 12.5.  Platelet count 110,000.  No she got some IV potassium and magnesium.  Her labs today show sodium 137.  Potassium 3.4.  BUN 14 creatinine 0.73.  Calcium 8.5.  Albumin 3.1.  Her white cell count is 10.1.  Hemoglobin 11.2.  Platelet count 86,000.  She has had no fever.  She had negative blood cultures.  She feels a lot better today.  She is eating.  All of her vital signs are stable.  Her temperature is 98.6.  Blood pressure 143/82.  Pulse is 73.  Her head and exam shows no ocular or oral lesions.  There are no palpable cervical or supraclavicular lymph nodes.  Lungs are clear bilaterally.  Cardiac exam regular rate and rhythm.  Abdomen soft.  Bowel sounds are present.  There is no guarding or rebound tenderness.  There is no palpable liver or spleen tip.  Extremities shows no clubbing, cyanosis or edema.  Neurological exam is nonfocal.  Tami [name] is a very nice 67 year old African-American female.  She has myeloma.  We have not treated her for  quite a while.  Again she has had toxicity from all the treatments that we tried on her.  She has had nausea and vomiting before.  She has had diarrhea before.  She is improving.  It is hard to say what the exact etiology of this is.  I forgot to mention  that she does have a pacemaker in place.  Hopefully, she will be able to go home.  I know that her myeloma studies have been holding relatively steady.    I am not sure as to why she had the leukopenia and when I saw her last week.  I do appreciate the great care that she is getting from everybody up on [address].   [name], MD  [name] 1:5

## 2023-03-29 NOTE — Discharge Summary (Signed)
Physician Discharge Summary   Patient: Tami [name] MRN: [id number] DOB: 1956-06-01  Admit date:     03/27/2023  Discharge date: 03/29/23  Discharge Physician: Tyrone Nine   PCP: [name], MD   Recommendations at discharge:  Follow up with cardiology later this week with repeat BMP, mag levels as well as titration of antihypertensive regimen.  Note continuation of losartan and, metoprolol, spironolactone given her persistent HTN and hypokalemia with stable renal function. Also continuing K supplement. Follow up with ID, Dr. Drue Second, as rescheduled to 5/28, for history of C. diff and post-infectious diarrhea (recently negative C. diff retesting and stable symptoms/exam).  Avoiding PPI Follow up with GI as scheduled next month for the diarrhea.  Follow up with oncology, Dr. Myna Hidalgo, as well for multiple myeloma complicated by treatment intolerances, pancytopenia treated with neulasta.   Discharge Diagnoses: Principal Problem:   Intractable nausea and vomiting Active Problems:   GERD (gastroesophageal reflux disease)   Hyperlipidemia   Hypertension   Tension headache   Hypokalemia   Multiple myeloma not having achieved remission (HCC)   Hypomagnesemia   Thrombocytopenia (HCC)   Chronic combined systolic and diastolic congestive heart failure [facility])  Hospital Course: HPI: Tami [name] is a 67 y.o. female with medical history significant of osteoarthritis, back pain, bowel obstruction, C. difficile diarrhea, chronic diastolic CHF, history of pacemaker placement, depression, GERD, epigastric abdominal pain, hyperlipidemia, hypertension, hypogammaglobulinemia, iron deficiency anemia due to chronic blood loss, monoclonal gammopathy of unknown significance, multiple myeloma, obstructive sleep apnea, history of stroke, history of TIA, history of UTI who presented to the emergency department with headache and nausea since Thursday.  Her appetite is decreased.  No diarrhea,  constipation, melena or hematochezia.  No flank pain, dysuria, frequency or hematuria. She received Neulasta on the previous day and feels like it may have caused her these symptoms as side effects.  She has felt warm, but no fever, chills or night sweats. No sore throat, rhinorrhea, dyspnea, wheezing or hemoptysis.  No chest pain, palpitations, diaphoresis, PND, orthopnea or pitting edema of the lower extremities.   No polyuria, polydipsia, polyphagia or blurred vision.   ED course: Initial vital signs were temperature 98.4 F, pulse 108, respiration 18, BP 167/92 mmHg O2 sat 94% on room air.  The patient received acetaminophen 1000 mg x 1, amlodipine 5 mg p.o. x 1, dexamethasone 10 mg IVP x 1, Benadryl 12.5 mg IVP x 1, losartan 50 mg p.o. x 1, KCl 60 mg p.o. x 1, KCl 10 mEq IVP x 3, LR 1000 mL bolus and magnesium sulfate 2 g IVPB.   Lab work: CBC showed a white count 18.5, hemoglobin 12.5 g/dL platelets 409.  Troponin was 35 then 38 ng/L.  Lipase is normal.  Magnesium 1.6 mg/dL.  CMP showed potassium of 2.9 mmol/L, glucose 127 mg/dL, total protein 9.8 g/dL and alkaline phosphatase 130 units/L.  The rest of the CMP measurements were normal.   Imaging: CT head without contrast was negative.  Hospital Course: She was admitted with oncology suspecting an intolerance to neulasta administered 5/15. Symptoms have improved significantly and she's tolerating a diet, requesting discharge home.   Assessment and Plan: Intractable nausea and vomiting: Perhaps an intolerance to GCSF. Symptoms have improved and she is no longer dependent on IVF. Would opt to avoid continued IVF with patient with CHF.  - Continue supportive care at home.   Tension headache: Resolved.   Hypokalemia: Continue daily supplementation   Hypomagnesemia: Supplemented.  Chronic combined systolic and diastolic congestive heart failure: No signs of decompensation. Continue beta-blocker and losartan. Continue furosemide as needed.    GERD:  - Continue PPI.   Hyperlipidemia: Currently not on therapy. - Follow-up with PCP.   Hypertension - Continue amlodipine 5 mg p.o. daily. - Continue losartan 50 mg p.o. daily. - Continue metoprolol succinate 50 mg p.o. daily.   Multiple myeloma not having achieved remission: - Follow-up with Dr. Myna Hidalgo as scheduled.   Thrombocytopenia (HCC) Monitor platelet count at follow up. No bleeding noted.  Consultants: Onc, Dr. Myna Hidalgo Procedures performed: None  Disposition: Home Diet recommendation: Advance as tolerated DISCHARGE MEDICATION: Allergies as of 03/29/2023       Reactions   Benazepril Anaphylaxis, Swelling, Hives   angioedema Throat and lip swelling   Ondansetron Hcl Hives   Redness and hives post IV admin on 07/05/17   Codeine Nausea And Vomiting   Morphine Hives   Redness and hives noted post IV admin on 07/05/17        Medication List     STOP taking these medications    nitrofurantoin (macrocrystal-monohydrate) 100 MG capsule Commonly known as: Macrobid       TAKE these medications    acyclovir 400 MG tablet Commonly known as: ZOVIRAX TAKE ONE (1) TABLET BY MOUTH TWO (2) TIMES DAILY What changed: See the new instructions.   albuterol 108 (90 Base) MCG/ACT inhaler Commonly known as: VENTOLIN HFA Inhale 2 puffs into the lungs every 6 (six) hours as needed for wheezing or shortness of breath.   albuterol (2.5 MG/3ML) 0.083% nebulizer solution Commonly known as: PROVENTIL Take 3 mLs (2.5 mg total) by nebulization every 6 (six) hours as needed for wheezing or shortness of breath.   Align 4 MG Caps Take 1 capsule (4 mg total) by mouth daily.   amLODipine 5 MG tablet Commonly known as: NORVASC Take 1 tablet (5 mg total) by mouth daily.   aspirin EC 81 MG tablet Take 81 mg by mouth daily. Swallow whole.   Benefiber Powd Take 1 Dose by mouth in the morning, at noon, and at bedtime.   budesonide 0.5 MG/2ML nebulizer solution Commonly  known as: Pulmicort Take 2 mLs (0.5 mg total) by nebulization 2 (two) times daily as needed (cough, wheezing or shortness of breath).   cetirizine 10 MG tablet Commonly known as: ZYRTEC Take 10 mg by mouth at bedtime.   cholestyramine light 4 g packet Commonly known as: PREVALITE Take 4 g by mouth 2 (two) times daily.   colestipol 5 g granules Commonly known as: Colestid Take 5 g by mouth 3 (three) times daily. What changed:  when to take this reasons to take this   dicyclomine 10 MG capsule Commonly known as: BENTYL Take 1 capsule (10 mg total) by mouth 4 (four) times daily -  before meals and at bedtime. What changed: additional instructions   fluticasone 50 MCG/ACT nasal spray Commonly known as: FLONASE Place 2 sprays into both nostrils daily.   furosemide 20 MG tablet Commonly known as: LASIX Take 20 mg by mouth daily as needed (fluid retention (feet swelling)).   hydrOXYzine 10 MG tablet Commonly known as: ATARAX TAKE ONE (1) TABLET BY MOUTH 3 TIMES DAILY AS NEEDED FOR ITCHING What changed:  how much to take how to take this when to take this reasons to take this additional instructions   hyoscyamine 0.125 MG SL tablet Commonly known as: LEVSIN SL Place 1 tablet (0.125 mg total) under the  tongue every 4 (four) hours as needed. What changed: reasons to take this   loperamide 2 MG capsule Commonly known as: IMODIUM Take 4 mg by mouth in the morning and at bedtime.   losartan 50 MG tablet Commonly known as: COZAAR Take 50 mg by mouth in the morning and at bedtime.   Magnesium Glycinate 100 MG Caps Take 200 mg by mouth at bedtime.   meclizine 12.5 MG tablet Commonly known as: ANTIVERT Take 12.5 mg by mouth 2 (two) times daily as needed for dizziness or nausea.   meloxicam 15 MG tablet Commonly known as: MOBIC Take 15 mg by mouth daily.   metoprolol succinate 50 MG 24 hr tablet Commonly known as: TOPROL-XL Take 1 tablet (50 mg total) by mouth 2 (two)  times daily. What changed: when to take this   nortriptyline 10 MG capsule Commonly known as: Pamelor Take 1 capsule (10 mg total) by mouth at bedtime. What changed: additional instructions   oxyCODONE-acetaminophen 5-325 MG tablet Commonly known as: PERCOCET/ROXICET Take 1 tablet by mouth every 6 (six) hours as needed for severe pain.   potassium chloride SA 20 MEQ tablet Commonly known as: KLOR-CON M Take 1 tablet (20 mEq total) by mouth daily.   prochlorperazine 10 MG tablet Commonly known as: COMPAZINE Take 0.5 tablets (5 mg total) by mouth 2 (two) times daily as needed for nausea or vomiting.   Quintabs Tabs Take 1 tablet by mouth daily.   spironolactone 25 MG tablet Commonly known as: ALDACTONE Take 12.5 mg by mouth daily.   Vitamin D3 50 MCG (2000 UT) Tabs Take 2,000 Units by mouth in the morning.        Follow-up Information     [name], MD Follow up.   Specialty: Family Medicine Contact information: [address] [place] [address] [phone]         Gayla Doss, MD Follow up.   Specialty: Cardiology Contact information: [address] [place] [address] [phone]         Josph Macho, MD Follow up.   Specialty: Oncology Contact information: [address] [place] [address] [phone]         Judyann Munson, MD Follow up.   Specialty: Infectious Diseases Contact information: [address] [place] [address] [phone]                Discharge Exam: Filed Weights   03/27/23 1516 03/28/23 0433  Weight: 87 kg 87 kg  BP (!) 143/82 (BP Location: Left Arm)   Pulse 73   Temp 98.6 F (37 C) (Oral)   Resp 16   Ht 5\' 6"  (1.676 m)   Wt 87 kg   SpO2 96%   BMI 30.96 kg/m   Well-appearing female in no distress Clear, nonlabored RRR, no MRG Soft, NT, ND, +BS  Condition at discharge: stable  The results of significant  diagnostics from this hospitalization (including imaging, microbiology, ancillary and laboratory) are listed below for reference.   Imaging Studies: CT Head Wo Contrast  Result Date: 03/27/2023 CLINICAL DATA:  Severe headache and nausea beginning yesterday. Dizziness. EXAM: CT HEAD WITHOUT CONTRAST TECHNIQUE: Contiguous axial images were obtained from the base of the skull through the vertex without intravenous contrast. RADIATION DOSE REDUCTION: This exam was performed according to the departmental dose-optimization program which includes automated exposure control, adjustment of the mA and/or kV according to  patient size and/or use of iterative reconstruction technique. COMPARISON:  01/10/2023 FINDINGS: Brain: No evidence of intracranial hemorrhage, acute infarction, hydrocephalus, extra-axial collection, or mass lesion/mass effect. Vascular:  No hyperdense vessel or other acute findings. Skull: No evidence of fracture or other significant bone abnormality. Sinuses/Orbits:  No acute findings. Other: None. IMPRESSION: Negative noncontrast head CT. Electronically Signed   By: Danae Orleans M.D.   On: 03/27/2023 17:59    Microbiology: Results for orders placed or performed during the hospital encounter of 03/27/23  Blood culture (routine x 2)     Status: None (Preliminary result)   Collection Time: 03/27/23  7:35 PM   Specimen: BLOOD  Result Value Ref Range Status   Specimen Description   Final    BLOOD RIGHT ANTECUBITAL Performed at [facility], [address] [place], [place] [address]    Special Requests   Final    BOTTLES DRAWN AEROBIC AND ANAEROBIC Blood Culture adequate volume Performed at [facility], [address] [place], [place] [address]    Culture   Final    NO GROWTH 2 DAYS Performed at [facility], [address], [place], [place] [address]    Report Status PENDING  Incomplete  Blood culture (routine x 2)     Status: None (Preliminary result)    Collection Time: 03/27/23  7:40 PM   Specimen: BLOOD  Result Value Ref Range Status   Specimen Description   Final    BLOOD LEFT ANTECUBITAL Performed at [facility], [address] [place], [place] [address]    Special Requests   Final    BOTTLES DRAWN AEROBIC AND ANAEROBIC Blood Culture adequate volume Performed at [facility], [address] [place], [place] [address]    Culture   Final    NO GROWTH 2 DAYS Performed at [facility], [address] [place], [place] [address]    Report Status PENDING  Incomplete   *Note: Due to a large number of results and/or encounters for the requested time period, some results have not been displayed. A complete set of results can be found in Results Review.    Labs: CBC: Recent Labs  Lab 03/24/23 0911 03/27/23 1817 03/28/23 0828 03/29/23 0402  WBC 1.9* 18.5* 25.4* 10.1  NEUTROABS 0.8* 12.7*  --   --   HGB 12.0 12.5 11.3* 11.2*  HCT 37.9 38.9 35.5* 35.3*  MCV 90.2 88.4 89.6 90.1  PLT 127* 110* 103* 86*   Basic Metabolic Panel: Recent Labs  Lab 03/24/23 0911 03/27/23 1817 03/28/23 0828 03/29/23 0402  NA 142 136 135 137  K 3.8 2.8* 4.4 3.4*  CL 105 99 105 105  CO2 29 24 22 25   GLUCOSE 101* 127* 119* 110*  BUN 14 10 10 14   CREATININE 0.59 0.62 0.74 0.73  CALCIUM 9.8 9.1 8.8* 8.5*  MG  --  1.6*  --   --   PHOS  --   --  2.8  --    Liver Function Tests: Recent Labs  Lab 03/24/23 0911 03/27/23 1817 03/28/23 0828 03/29/23 0402  AST 25 38 46* 92*  ALT 12 18 21 24   ALKPHOS 50 130* 124 84  BILITOT 0.3 0.5 0.8 0.5  PROT 9.4* 9.8* 8.9* 8.2*  ALBUMIN 3.9 3.6 3.3* 3.1*   CBG: No results for input(s): "GLUCAP" in the last 168 hours.  Discharge time spent: greater than 30 minutes.  Signed: Tyrone Nine, MD [facility] 03/29/2023

## 2023-03-30 LAB — CULTURE, BLOOD (ROUTINE X 2)

## 2023-03-31 LAB — CULTURE, BLOOD (ROUTINE X 2): Special Requests: ADEQUATE

## 2023-04-01 DIAGNOSIS — Z8619 Personal history of other infectious and parasitic diseases: Secondary | ICD-10-CM | POA: Diagnosis not present

## 2023-04-01 DIAGNOSIS — I5042 Chronic combined systolic (congestive) and diastolic (congestive) heart failure: Secondary | ICD-10-CM | POA: Diagnosis not present

## 2023-04-01 DIAGNOSIS — R197 Diarrhea, unspecified: Secondary | ICD-10-CM | POA: Diagnosis not present

## 2023-04-01 DIAGNOSIS — R634 Abnormal weight loss: Secondary | ICD-10-CM | POA: Diagnosis not present

## 2023-04-01 DIAGNOSIS — I480 Paroxysmal atrial fibrillation: Secondary | ICD-10-CM | POA: Diagnosis not present

## 2023-04-01 DIAGNOSIS — K580 Irritable bowel syndrome with diarrhea: Secondary | ICD-10-CM | POA: Diagnosis not present

## 2023-04-01 DIAGNOSIS — Z09 Encounter for follow-up examination after completed treatment for conditions other than malignant neoplasm: Secondary | ICD-10-CM | POA: Diagnosis not present

## 2023-04-01 DIAGNOSIS — K529 Noninfective gastroenteritis and colitis, unspecified: Secondary | ICD-10-CM | POA: Diagnosis not present

## 2023-04-01 DIAGNOSIS — Z95810 Presence of automatic (implantable) cardiac defibrillator: Secondary | ICD-10-CM | POA: Diagnosis not present

## 2023-04-01 DIAGNOSIS — I110 Hypertensive heart disease with heart failure: Secondary | ICD-10-CM | POA: Diagnosis not present

## 2023-04-01 LAB — PROTEIN ELECTROPHORESIS, SERUM, WITH REFLEX
A/G Ratio: 0.6 — ABNORMAL LOW (ref 0.7–1.7)
Albumin ELP: 3.4 g/dL (ref 2.9–4.4)
Alpha-1-Globulin: 0.3 g/dL (ref 0.0–0.4)
Alpha-2-Globulin: 1 g/dL (ref 0.4–1.0)
Beta Globulin: 1.2 g/dL (ref 0.7–1.3)
Gamma Globulin: 2.8 g/dL — ABNORMAL HIGH (ref 0.4–1.8)
Globulin, Total: 5.4 g/dL — ABNORMAL HIGH (ref 2.2–3.9)
M-Spike, %: 2 g/dL — ABNORMAL HIGH
SPEP Interpretation: 0
Total Protein ELP: 8.8 g/dL — ABNORMAL HIGH (ref 6.0–8.5)

## 2023-04-01 LAB — CULTURE, BLOOD (ROUTINE X 2)
Culture: NO GROWTH
Culture: NO GROWTH

## 2023-04-01 LAB — IMMUNOFIXATION REFLEX, SERUM
IgA: 185 mg/dL (ref 87–352)
IgG (Immunoglobin G), Serum: 3440 mg/dL — ABNORMAL HIGH (ref 586–1602)
IgM (Immunoglobulin M), Srm: 134 mg/dL (ref 26–217)

## 2023-04-02 ENCOUNTER — Other Ambulatory Visit (HOSPITAL_COMMUNITY)
Admission: RE | Admit: 2023-04-02 | Discharge: 2023-04-02 | Disposition: A | Discharge status: Home / Self Care | Payer: 59 | Source: Ambulatory Visit | Attending: Family Medicine | Admitting: Family Medicine

## 2023-04-02 ENCOUNTER — Encounter: Payer: Self-pay | Admitting: Family Medicine

## 2023-04-02 ENCOUNTER — Ambulatory Visit (INDEPENDENT_AMBULATORY_CARE_PROVIDER_SITE_OTHER): Payer: 59 | Admitting: Family Medicine

## 2023-04-02 VITALS — BP 130/85 | HR 100 | Temp 97.8°F | Ht 66.0 in | Wt 191.0 lb

## 2023-04-02 DIAGNOSIS — R1030 Lower abdominal pain, unspecified: Secondary | ICD-10-CM | POA: Diagnosis not present

## 2023-04-02 DIAGNOSIS — R1013 Epigastric pain: Secondary | ICD-10-CM

## 2023-04-02 DIAGNOSIS — N898 Other specified noninflammatory disorders of vagina: Secondary | ICD-10-CM | POA: Diagnosis present

## 2023-04-02 LAB — POC URINALSYSI DIPSTICK (AUTOMATED)
Bilirubin, UA: NEGATIVE
Blood, UA: NEGATIVE
Glucose, UA: NEGATIVE
Ketones, UA: NEGATIVE
Leukocytes, UA: NEGATIVE
Nitrite, UA: NEGATIVE
Protein, UA: NEGATIVE
Spec Grav, UA: 1.015 (ref 1.010–1.025)
Urobilinogen, UA: 0.2 E.U./dL
pH, UA: 6 (ref 5.0–8.0)

## 2023-04-02 NOTE — Progress Notes (Signed)
Acute Office Visit  Subjective:     [name], female    DOB: 1956-03-23, 67 y.o.   MRN: [id number]  Chief Complaint  Patient presents with   Hospitalization Follow-up    HPI Patient is in today for hospital follow-up.  She was recently hospitalized at [facility] from 03/27/23 - 03/29/23 for nausea and headache. Oncology suspected an intolerance to her Neulasta which was administered on 03/24/23. She had potassium and magnesium replacement. She has already followed up with her cardiologist (yesterday) for hospital follow-up. At that time she was not having any symptoms. BMP and magnesium were rechecked (Mag 1.8, other labs normal). She is scheduled to follow-up with heart failure clinic in July. Her ICD was checked in office and reported as functioning normally.  She has upcoming appointments with Dr. Drue Second (ID) for diarrhea with history of C. Diff, recently negative retest.  She is scheduled to follow-up with GI next month.  She is following with oncology for multiple myeloma.   Reports she has been feeling better since discharge, just tired. A few days ago noticed some mild lower abdominal soreness and urinary odor/cloudiness on Tuesday. She has also had some perineal itching. She has also been struggling with acid reflux and epigastric pain - states she felt better when she was recently on antibiotics, but it restarted after finishing antibiotics. Denies hematuria, dysuria, urinary frequency, urgency, fevers, chills.     ROS All review of systems negative except what is listed in the HPI      Objective:    BP 130/85   Pulse 100   Temp 97.8 F (36.6 C) (Oral)   Ht 5\' 6"  (1.676 m)   Wt 191 lb (86.6 kg)   SpO2 99%   BMI 30.83 kg/m    Physical Exam Vitals reviewed.  Constitutional:      Appearance: Normal appearance.  Cardiovascular:     Rate and Rhythm: Normal rate and regular rhythm.     Pulses: Normal pulses.     Heart sounds: Normal heart sounds.   Pulmonary:     Effort: Pulmonary effort is normal.     Breath sounds: Normal breath sounds.  Abdominal:     General: Bowel sounds are normal. There is no distension.     Palpations: Abdomen is soft.     Tenderness: There is no guarding or rebound.  Genitourinary:    General: Normal vulva.     Vagina: No vaginal discharge.  Skin:    General: Skin is warm and dry.  Neurological:     Mental Status: She is alert and oriented to person, place, and time.  Psychiatric:        Mood and Affect: Mood normal.        Behavior: Behavior normal.        Thought Content: Thought content normal.        Judgment: Judgment normal.     Results for orders placed or performed in visit on 04/02/23  POCT Urinalysis Dipstick (Automated)  Result Value Ref Range   Color, UA yellow    Clarity, UA clear    Glucose, UA Negative Negative   Bilirubin, UA negative    Ketones, UA negative    Spec Grav, UA 1.015 1.010 - 1.025   Blood, UA negative    pH, UA 6.0 5.0 - 8.0   Protein, UA Negative Negative   Urobilinogen, UA 0.2 0.2 or 1.0 E.U./dL   Nitrite, UA negative  Leukocytes, UA Negative Negative        Assessment & Plan:   Problem List Items Addressed This Visit     AP (abdominal pain) - Primary   Relevant Orders   POCT Urinalysis Dipstick (Automated) (Completed)   Urine Culture   Epigastric abdominal pain   Relevant Orders   H. pylori breath test   Other Visit Diagnoses     Vaginal itching       Relevant Orders   Cervicovaginal ancillary only     No signs of UTI, will send off for culture.  Doing a vaginal swab to check for yeast and BV - we will update you with results.  Adding H. Pylori test for your epigastric pain. Keep upcoming GI appointment.  Make sure you are staying well hydrated Keep your upcoming specialist appointments.   Patient aware of signs/symptoms requiring further/urgent evaluation.   No orders of the defined types were placed in this encounter.   Return  if symptoms worsen or fail to improve.  Clayborne Dana, NP

## 2023-04-02 NOTE — Patient Instructions (Addendum)
No signs of UTI, will send off for culture.  Doing a vaginal swab to check for yeast and BV - we will update you with results.  Adding H. Pylori test for your epigastric pain. Keep upcoming GI appointment.  Make sure you are staying well hydrated Keep your upcoming specialist appointments.

## 2023-04-04 ENCOUNTER — Encounter (HOSPITAL_BASED_OUTPATIENT_CLINIC_OR_DEPARTMENT_OTHER): Payer: Self-pay | Admitting: Emergency Medicine

## 2023-04-04 ENCOUNTER — Other Ambulatory Visit: Payer: Self-pay

## 2023-04-04 ENCOUNTER — Emergency Department (HOSPITAL_BASED_OUTPATIENT_CLINIC_OR_DEPARTMENT_OTHER)
Admission: EM | Admit: 2023-04-04 | Discharge: 2023-04-04 | Disposition: A | Discharge status: Home / Self Care | Payer: 59 | Attending: Emergency Medicine | Admitting: Emergency Medicine

## 2023-04-04 DIAGNOSIS — R197 Diarrhea, unspecified: Secondary | ICD-10-CM | POA: Insufficient documentation

## 2023-04-04 DIAGNOSIS — Z950 Presence of cardiac pacemaker: Secondary | ICD-10-CM | POA: Insufficient documentation

## 2023-04-04 DIAGNOSIS — R5383 Other fatigue: Secondary | ICD-10-CM | POA: Diagnosis not present

## 2023-04-04 DIAGNOSIS — I5032 Chronic diastolic (congestive) heart failure: Secondary | ICD-10-CM | POA: Insufficient documentation

## 2023-04-04 DIAGNOSIS — I110 Hypertensive heart disease with heart failure: Secondary | ICD-10-CM | POA: Diagnosis not present

## 2023-04-04 LAB — CBC WITH DIFFERENTIAL/PLATELET
Abs Immature Granulocytes: 0.01 10*3/uL (ref 0.00–0.07)
Basophils Absolute: 0 10*3/uL (ref 0.0–0.1)
Basophils Relative: 1 %
Eosinophils Absolute: 0 10*3/uL (ref 0.0–0.5)
Eosinophils Relative: 1 %
HCT: 35.8 % — ABNORMAL LOW (ref 36.0–46.0)
Hemoglobin: 11.3 g/dL — ABNORMAL LOW (ref 12.0–15.0)
Immature Granulocytes: 0 %
Lymphocytes Relative: 29 %
Lymphs Abs: 0.7 10*3/uL (ref 0.7–4.0)
MCH: 28.4 pg (ref 26.0–34.0)
MCHC: 31.6 g/dL (ref 30.0–36.0)
MCV: 89.9 fL (ref 80.0–100.0)
Monocytes Absolute: 0.2 10*3/uL (ref 0.1–1.0)
Monocytes Relative: 8 %
Neutro Abs: 1.5 10*3/uL — ABNORMAL LOW (ref 1.7–7.7)
Neutrophils Relative %: 61 %
Platelets: 107 10*3/uL — ABNORMAL LOW (ref 150–400)
RBC: 3.98 MIL/uL (ref 3.87–5.11)
RDW: 14.3 % (ref 11.5–15.5)
WBC: 2.5 10*3/uL — ABNORMAL LOW (ref 4.0–10.5)
nRBC: 0 % (ref 0.0–0.2)

## 2023-04-04 LAB — COMPREHENSIVE METABOLIC PANEL
ALT: 21 U/L (ref 0–44)
AST: 36 U/L (ref 15–41)
Albumin: 3.2 g/dL — ABNORMAL LOW (ref 3.5–5.0)
Alkaline Phosphatase: 58 U/L (ref 38–126)
Anion gap: 9 (ref 5–15)
BUN: 15 mg/dL (ref 8–23)
CO2: 22 mmol/L (ref 22–32)
Calcium: 8.9 mg/dL (ref 8.9–10.3)
Chloride: 106 mmol/L (ref 98–111)
Creatinine, Ser: 0.74 mg/dL (ref 0.44–1.00)
GFR, Estimated: >60 mL/min (ref >60)
Glucose, Bld: 151 mg/dL — ABNORMAL HIGH (ref 70–99)
Potassium: 3.8 mmol/L (ref 3.5–5.1)
Sodium: 137 mmol/L (ref 135–145)
Total Bilirubin: 0.4 mg/dL (ref 0.3–1.2)
Total Protein: 8.8 g/dL — ABNORMAL HIGH (ref 6.5–8.1)

## 2023-04-04 LAB — C DIFFICILE QUICK SCREEN W PCR REFLEX
C Diff antigen: NEGATIVE
C Diff interpretation: NOT DETECTED
C Diff toxin: NEGATIVE

## 2023-04-04 LAB — URINE CULTURE
MICRO NUMBER:: 15000937
SPECIMEN QUALITY:: ADEQUATE

## 2023-04-04 LAB — MAGNESIUM: Magnesium: 1.8 mg/dL (ref 1.7–2.4)

## 2023-04-04 MED ORDER — LACTATED RINGERS IV BOLUS
500.0000 mL | Freq: Once | INTRAVENOUS | Status: AC
  Administered 2023-04-04: 500 mL via INTRAVENOUS

## 2023-04-04 NOTE — ED Triage Notes (Signed)
Pt reports diarrhea 3-4x a day and fells dehydrated. No emesis. Pt reports she was recently admitted for similar.

## 2023-04-04 NOTE — ED Provider Notes (Signed)
[place] [facility] AT [facility] Provider Note   CSN: [id number] Arrival date & time: 04/04/23  1013     History  Chief Complaint  Patient presents with   Diarrhea    Tami [name] is a 67 y.o. female.  HPI     67 year old female with a history of chronic diastolic congestive heart failure, pacemaker placement, depression, GERD, hyperlipidemia, hypertension, hypogammaglobulinemia, iron deficiency anemia, MGUS, multiple myeloma, OSA, CVA, history of C. difficile, diarrhea for which she sees GI, recent admission May 18 to May 20 for intractable nausea and vomiting, diarrhea who presents with concern for diarrhea.   After recent admission, is recommended she follow-up with Dr. Ilsa Iha as scheduled May 28 for history of C. difficile and postinfectious diarrhea, with report of recently negative C. difficile retesting and stable symptoms exam.  They recommended follow-up with gastroenterology next month for diarrhea.  They also recommended she follow-up with oncology for multiple myeloma complicated by treatment intolerances pancytopenia treated with Neulasta.  Had diarrhea, 4 times yesterday, twice this morning.  Diarrhea had improved while in hospital and then came back on Friday.  Concern the neulasta caused symptoms but has not had them since returning home.  Had K and Mg while in the hospital.   Has had diarrhea for 2 years off and on.  Had cdiff a long time ago-10 years, then again last year 3 times.  This year had it twice.  Can tell getting dehydrated, lids sleep, low energy, fatigue.  If no diarrhea feels better.  No nausea, vomiting this time.  No known fever, just feels it on the inside. No abdominal pain.  Nor urinary symptoms. No recent antibiotics.  Wednesday went upstairs thought maybe yeast infection, was itching.   Past Medical History:  Diagnosis Date   Abnormal SPEP 07/26/2016   Arthritis    knees, hands   Back pain 07/14/2016   Bowel  obstruction (HCC) 11/2014   C. 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   Multiple myeloma (HCC) 07/20/2018   Multiple myeloma not having achieved remission (HCC) 07/20/2018   Myalgia 12/31/2016   Obstructive sleep apnea 03/31/2015   Presence of permanent cardiac pacemaker    SOB (shortness of breath) 04/09/2016   Stroke (HCC)    TIAs   UTI (urinary tract infection)      Home Medications Prior to Admission medications   Medication Sig Start Date End Date Taking? Authorizing Provider  acyclovir (ZOVIRAX) 400 MG tablet TAKE ONE (1) TABLET BY MOUTH TWO (2) TIMES DAILY Patient taking differently: 400 mg 2 (two) times daily. 08/29/22   Josph Macho, MD  albuterol (PROVENTIL) (2.5 MG/3ML) 0.083% nebulizer solution Take 3 mLs (2.5 mg total) by nebulization every 6 (six) hours as needed for wheezing or shortness of breath. 12/15/22   [name], MD  albuterol (VENTOLIN HFA) 108 (90 Base) MCG/ACT inhaler Inhale 2 puffs into the lungs every 6 (six) hours as needed for wheezing or shortness of breath.  12/07/22   [name], MD  amLODipine (NORVASC) 5 MG tablet Take 1 tablet (5 mg total) by mouth daily. 03/16/23   [name], MD  aspirin EC 81 MG tablet Take 81 mg by mouth daily. Swallow whole.    [provider]  budesonide (PULMICORT) 0.5 MG/2ML nebulizer solution Take 2 mLs (0.5 mg total) by nebulization 2 (two) times daily as needed (cough, wheezing or shortness of breath). 12/31/22   Martina Sinner, MD  cetirizine  (ZYRTEC) 10 MG tablet Take 10 mg by mouth at bedtime.    [provider]  Cholecalciferol (VITAMIN D3) 50 MCG (2000 UT) TABS Take 2,000 Units by mouth in the morning.    [provider]  cholestyramine light (PREVALITE) 4 g packet Take 4 g by mouth 2 (two) times daily.    [provider]  colestipol (COLESTID) 5 g granules Take 5 g by mouth 3 (three) times daily. Patient taking differently: Take 5 g by mouth 3 (three) times daily as needed. 03/16/23   [name], MD  dicyclomine (BENTYL) 10 MG capsule Take 1 capsule (10 mg total) by mouth 4 (four) times daily -  before meals and at bedtime. Patient taking differently: Take 10 mg by mouth 4 (four) times daily -  before meals and at bedtime. cramping 01/25/23 04/25/23  Judyann Munson, MD  fluticasone (FLONASE) 50 MCG/ACT nasal spray Place 2 sprays into both nostrils daily. 01/07/20   Joy, Shawn C, PA-C  furosemide (LASIX) 20 MG tablet Take 20 mg by mouth daily as needed (fluid retention (feet swelling)). 07/03/20   [provider]  hydrOXYzine (ATARAX) 10 MG tablet TAKE ONE (1) TABLET BY MOUTH 3 TIMES DAILY AS NEEDED FOR ITCHING Patient taking differently: Take 10 mg by mouth 3 (three) times daily as needed for itching. 03/03/23   [name], MD  hyoscyamine (LEVSIN SL) 0.125 MG SL tablet Place 1 tablet (0.125 mg total) under the tongue every 4 (four) hours as needed. Patient taking differently: Place 0.125 mg under the tongue every 4 (four) hours as needed for cramping. 05/21/22   [name], MD  loperamide (IMODIUM) 2 MG capsule Take 4 mg by mouth in the morning and at bedtime.    [provider]  losartan (COZAAR) 50 MG tablet Take 50 mg by mouth in the morning and at bedtime. 04/14/21   [provider]  Magnesium Glycinate 100 MG CAPS Take 200 mg by mouth at bedtime. 09/21/22   [provider]  meclizine (ANTIVERT) 12.5 MG tablet Take 12.5 mg by mouth 2 (two) times daily as needed  for dizziness or nausea. 01/28/22   [provider]  meloxicam (MOBIC) 15 MG tablet Take 15 mg by mouth daily. 12/31/22   [provider]  metoprolol succinate (TOPROL-XL) 50 MG 24 hr tablet Take 1 tablet (50 mg total) by mouth 2 (two) times daily. Patient taking differently: Take 50 mg by mouth daily. 02/23/23   [name], MD  Multiple Vitamin Brandt Loosen) TABS Take 1 tablet by mouth daily. 11/06/21   [provider]  nortriptyline (PAMELOR) 10 MG capsule Take 1 capsule (10 mg total) by mouth at bedtime. Patient taking differently: Take 10 mg by mouth at bedtime. As needed 01/12/23   [name], MD  oxyCODONE-acetaminophen (PERCOCET/ROXICET) 5-325 MG tablet Take 1 tablet by mouth every 6 (six) hours as needed for severe pain. 11/07/21   [provider]  potassium chloride SA (KLOR-CON  M) 20 MEQ tablet Take 1 tablet (20 mEq total) by mouth daily. 01/15/23   [name], MD  Probiotic Product (ALIGN) 4 MG CAPS Take 1 capsule (4 mg total) by mouth daily. 03/16/23   [name], MD  prochlorperazine (COMPAZINE) 10 MG tablet Take 0.5 tablets (5 mg total) by mouth 2 (two) times daily as needed for nausea or vomiting. 03/16/23   [name], MD  spironolactone (ALDACTONE) 25 MG tablet Take 12.5 mg by mouth daily.    [provider]  Wheat Dextrin (BENEFIBER) POWD Take 1 Dose by mouth in the morning, at noon, and at bedtime. 03/16/23 04/15/23  [name], MD      Allergies    Benazepril, Ondansetron hcl, Codeine, and Morphine    Review of Systems   Review of Systems  Physical Exam Updated Vital Signs BP (!) 170/103   Pulse 86   Temp 98.4 F (36.9 C) (Oral)   Resp 16   Ht 5\' 6"  (1.676 m)   Wt 87.1 kg   SpO2 97%   BMI 30.99 kg/m  Physical Exam Vitals and nursing note reviewed.  Constitutional:      General: She is not in acute distress.    Appearance: She is well-developed. She is not diaphoretic.  HENT:     Head: Normocephalic and  atraumatic.  Eyes:     Conjunctiva/sclera: Conjunctivae normal.  Cardiovascular:     Rate and Rhythm: Normal rate and regular rhythm.     Heart sounds: Normal heart sounds. No murmur heard.    No friction rub. No gallop.  Pulmonary:     Effort: Pulmonary effort is normal. No respiratory distress.     Breath sounds: Normal breath sounds. No wheezing or rales.  Abdominal:     General: There is no distension.     Palpations: Abdomen is soft.     Tenderness: There is no abdominal tenderness. There is no guarding.  Musculoskeletal:        General: No tenderness.     Cervical back: Normal range of motion.  Skin:    General: Skin is warm and dry.     Findings: No erythema or rash.  Neurological:     Mental Status: She is alert and oriented to person, place, and time.     ED Results / Procedures / Treatments   Labs (all labs ordered are listed, but only abnormal results are displayed) Labs Reviewed  CBC WITH DIFFERENTIAL/PLATELET - Abnormal; Notable for the following components:      Result Value   WBC 2.5 (*)    Hemoglobin 11.3 (*)    HCT 35.8 (*)    Platelets 107 (*)    Neutro Abs 1.5 (*)    All other components within normal limits  COMPREHENSIVE METABOLIC PANEL - Abnormal; Notable for the following components:   Glucose, Bld 151 (*)    Total Protein 8.8 (*)    Albumin 3.2 (*)    All other components within normal limits  C DIFFICILE QUICK SCREEN W PCR REFLEX    GASTROINTESTINAL PANEL BY PCR, STOOL (REPLACES STOOL CULTURE)  MAGNESIUM    EKG None  Radiology No results found.  Procedures Procedures    Medications Ordered in ED Medications  lactated ringers bolus 500 mL (0 mLs Intravenous Stopped 04/04/23 1134)    ED Course/ Medical Decision Making/ A&P  67 year old female with a history of chronic diastolic congestive heart failure, pacemaker placement, depression, GERD, hyperlipidemia, hypertension, hypogammaglobulinemia, iron  deficiency anemia, MGUS, multiple myeloma, OSA, CVA, history of C. difficile, diarrhea for which she sees GI, recent admission May 18 to May 20 for intractable nausea and vomiting, diarrhea who presents with concern for diarrhea.  Abdominal exam benign, low suspicion for SBO, acute surgical emergency. Had CT imaging 5/10.   Labs completed and personally intpreted by me show mild leukopenia, anemia, similar to prior values, CMP without significant electrolyte abnormalities, Cr normal, Mg normal.   Appears dehyrated and suspect this by history, given IV fluid and feels improved. Stool testing again sent.  Is to follow up with GI, oncology and ID as scheduled. Discussed reasons to return. Patient discharged in stable condition with understanding of reasons to return.         Final Clinical Impression(s) / ED Diagnoses Final diagnoses:  Diarrhea, unspecified type  Other fatigue    Rx / DC Orders ED Discharge Orders     None         Alvira Monday, MD 04/04/23 2224

## 2023-04-04 NOTE — ED Notes (Signed)
ED Provider at bedside. 

## 2023-04-05 LAB — GASTROINTESTINAL PANEL BY PCR, STOOL (REPLACES STOOL CULTURE)

## 2023-04-06 ENCOUNTER — Encounter: Payer: Self-pay | Admitting: Internal Medicine

## 2023-04-06 ENCOUNTER — Other Ambulatory Visit: Payer: Self-pay

## 2023-04-06 ENCOUNTER — Ambulatory Visit (INDEPENDENT_AMBULATORY_CARE_PROVIDER_SITE_OTHER): Payer: 59 | Admitting: Internal Medicine

## 2023-04-06 VITALS — Wt 191.0 lb

## 2023-04-06 DIAGNOSIS — K580 Irritable bowel syndrome with diarrhea: Secondary | ICD-10-CM

## 2023-04-06 DIAGNOSIS — B9689 Other specified bacterial agents as the cause of diseases classified elsewhere: Secondary | ICD-10-CM | POA: Diagnosis not present

## 2023-04-06 DIAGNOSIS — B3731 Acute candidiasis of vulva and vagina: Secondary | ICD-10-CM

## 2023-04-06 DIAGNOSIS — N760 Acute vaginitis: Secondary | ICD-10-CM

## 2023-04-06 DIAGNOSIS — Z1231 Encounter for screening mammogram for malignant neoplasm of breast: Secondary | ICD-10-CM | POA: Diagnosis not present

## 2023-04-06 LAB — CERVICOVAGINAL ANCILLARY ONLY
Bacterial Vaginitis (gardnerella): POSITIVE — AB
Candida Glabrata: NEGATIVE
Candida Vaginitis: POSITIVE — AB
Comment: NEGATIVE
Comment: NEGATIVE
Comment: NEGATIVE

## 2023-04-06 LAB — H. PYLORI BREATH TEST: H. pylori Breath Test: NOT DETECTED

## 2023-04-06 MED ORDER — FLUCONAZOLE 200 MG PO TABS: 200.0000 mg | ORAL_TABLET | Freq: Every day | ORAL | 0 refills | Status: DC

## 2023-04-06 MED ORDER — METRONIDAZOLE 500 MG PO TABS
500.0000 mg | ORAL_TABLET | Freq: Two times a day (BID) | ORAL | 0 refills | Status: DC
Start: 2023-04-06 — End: 2023-04-27

## 2023-04-06 NOTE — Progress Notes (Signed)
[name], female   DOB: 1956/09/19, 67 y.o.   MRN: [id number]  HPI Tami [name] is a 67yo F who has had recurrent cdifficile but most recently testing was negative. 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 She was recently seen by provider a few days ago and found to have  +BV and candidal vaginitis  Take fiber (TID) and immodium 2 tabs BID that hasn't helped diarrrhea. Has about 3-4-5 loose stools per day.   Outpatient Encounter Medications as of 04/06/2023  Medication Sig   acyclovir (ZOVIRAX) 400 MG tablet TAKE ONE (1) TABLET BY MOUTH TWO (2) TIMES DAILY (Patient taking differently: 400 mg 2 (two) times daily.)   albuterol (PROVENTIL) (2.5 MG/3ML) 0.083% nebulizer solution Take 3 mLs (2.5 mg total) by nebulization every 6 (six) hours as needed for wheezing or shortness of breath.   albuterol (VENTOLIN HFA) 108 (90 Base) MCG/ACT inhaler Inhale 2 puffs into the lungs every 6 (six) hours as needed for wheezing or shortness of breath.   amLODipine (NORVASC) 5 MG tablet Take 1 tablet (5 mg total) by mouth daily.   aspirin EC 81 MG tablet Take 81 mg by mouth daily. Swallow whole.   budesonide (PULMICORT) 0.5 MG/2ML nebulizer solution Take 2 mLs (0.5 mg total) by nebulization 2 (two) times daily as needed (cough, wheezing or shortness of breath).   cetirizine (ZYRTEC) 10 MG tablet Take 10 mg by mouth at bedtime.   Cholecalciferol (VITAMIN D3) 50 MCG (2000 UT) TABS Take 2,000 Units by mouth in the morning.   cholestyramine light (PREVALITE) 4 g packet Take 4 g by mouth 2 (two) times daily.   colestipol (COLESTID) 5 g granules Take 5 g by mouth 3 (three) times daily. (Patient taking differently: Take 5 g by mouth 3 (three) times daily as needed.)   dicyclomine (BENTYL) 10 MG capsule Take 1 capsule (10 mg total) by mouth 4 (four) times daily -  before meals and at bedtime. (Patient taking differently: Take 10 mg by mouth 4 (four) times daily -  before meals  and at bedtime. cramping)   fluticasone (FLONASE) 50 MCG/ACT nasal spray Place 2 sprays into both nostrils daily.   furosemide (LASIX) 20 MG tablet Take 20 mg by mouth daily as needed (fluid retention (feet swelling)).   hydrOXYzine (ATARAX) 10 MG tablet TAKE ONE (1) TABLET BY MOUTH 3 TIMES DAILY AS NEEDED FOR ITCHING (Patient taking differently: Take 10 mg by mouth 3 (three) times daily as needed for itching.)   hyoscyamine (LEVSIN SL) 0.125 MG SL tablet Place 1 tablet (0.125 mg total) under the tongue every 4 (four) hours as needed. (Patient taking differently: Place 0.125 mg under the tongue every 4 (four) hours as needed for cramping.)   loperamide (IMODIUM) 2 MG capsule Take 4 mg by mouth in the morning and at bedtime.   losartan (COZAAR) 50 MG tablet Take 50 mg by mouth in the morning and at bedtime.   Magnesium Glycinate 100 MG CAPS Take 200 mg by mouth at bedtime.   meclizine (ANTIVERT) 12.5 MG tablet Take 12.5 mg by mouth 2 (two) times daily as needed for dizziness or nausea.   meloxicam (MOBIC) 15 MG tablet Take 15 mg by mouth daily.   metoprolol succinate (TOPROL-XL) 50 MG 24 hr tablet Take 1 tablet (50 mg total) by mouth 2 (two) times daily. (Patient taking differently: Take 50 mg by mouth daily.)   Multiple Vitamin (QUINTABS) TABS Take 1  tablet by mouth daily.   nortriptyline (PAMELOR) 10 MG capsule Take 1 capsule (10 mg total) by mouth at bedtime. (Patient taking differently: Take 10 mg by mouth at bedtime. As needed)   oxyCODONE-acetaminophen (PERCOCET/ROXICET) 5-325 MG tablet Take 1 tablet by mouth every 6 (six) hours as needed for severe pain.   potassium chloride SA (KLOR-CON M) 20 MEQ tablet Take 1 tablet (20 mEq total) by mouth daily.   Probiotic Product (ALIGN) 4 MG CAPS Take 1 capsule (4 mg total) by mouth daily.   prochlorperazine (COMPAZINE) 10 MG tablet Take 0.5 tablets (5 mg total) by mouth 2 (two) times daily as needed for nausea or vomiting.   spironolactone (ALDACTONE)  25 MG tablet Take 12.5 mg by mouth daily.   Wheat Dextrin (BENEFIBER) POWD Take 1 Dose by mouth in the morning, at noon, and at bedtime.   Facility-Administered Encounter Medications as of 04/06/2023  Medication   nitroGLYCERIN (NITROSTAT) SL tablet 0.4 mg     Patient Active Problem List   Diagnosis Date Noted   Chronic combined systolic and diastolic congestive heart failure (HCC) 03/28/2023   Intractable nausea and vomiting 03/27/2023   Asthma 12/20/2022   Irritable bowel syndrome with diarrhea    Nausea and vomiting    Hypocalcemia 08/25/2022   Iron deficiency anemia due to chronic blood loss 05/28/2022   Chronic diarrhea 05/27/2022   Soft tissue lesion 04/28/2022   Obesity (BMI 30-39.9) 04/28/2022   SBO (small bowel obstruction) (HCC) 04/27/2022   Dehydration 04/03/2022   Acute vaginitis 03/18/2022   Cervical cancer screening 03/18/2022   Dizziness 12/11/2021   Visual changes 11/26/2021   Ear fullness, bilateral 08/12/2021   Bilateral knee pain 08/08/2021   History of 2019 novel coronavirus disease (COVID-19) 12/06/2020   Vitamin deficiency 07/31/2020   Acute combined systolic and diastolic heart failure (HCC) 05/09/2020   Hypomagnesemia 03/04/2020   Thrombocytopenia (HCC) 03/04/2020   Dysphagia 03/04/2020   Constipation 03/04/2020   Sensation of pressure in bladder area 01/17/2020   Hyperglycemia 01/17/2020   Peripheral neuropathy 11/15/2019   Chronic migraine without aura, with intractable migraine, so stated, with status migrainosus 10/17/2019   ETD (Eustachian tube dysfunction), bilateral 09/12/2019   Urinary frequency 07/17/2019   Headache 07/17/2019   Palpitation 05/15/2019   Clostridium difficile diarrhea 09/04/2018   Thyroid nodule 08/26/2018   Multiple myeloma (HCC) 07/20/2018   Multiple myeloma not having achieved remission (HCC) 07/20/2018   Hematuria 07/11/2018   Seasonal allergies 06/23/2018   Right shoulder pain 06/07/2018   Neck pain 06/07/2018    Paresthesias 06/07/2018   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   Pain in thoracic spine 03/18/2018   Thoracic back pain 02/15/2018   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   Hypokalemia 08/20/2017   Anemia 08/20/2017   Elevated sed rate 08/15/2017   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   Epigastric abdominal pain 03/22/2017   Myalgia 12/31/2016   Abnormal SPEP 07/26/2016   Low back pain 07/14/2016   Insomnia 12/29/2015   Tension headache 10/21/2015   Muscle spasms of neck 10/18/2015   Dysuria 10/13/2015   AP (abdominal pain) 07/07/2015   Cardiomyopathy (HCC) 04/19/2015   Chest pain 04/19/2015   Obstructive sleep apnea 03/31/2015   Congestive heart failure (HCC) 02/24/2015   Arthritis    Hyperlipidemia    Hypertension    Depression    Stroke [facility])    GERD (gastroesophageal reflux disease)    Bowel obstruction (HCC) 11/09/2014     Health  Maintenance Due  Topic Date Due   Pneumonia Vaccine 85+ Years old (4 of 4 - PPSV23 or PCV20) 06/02/2022     Review of Systems 12 point ros is negative except what is mentioned above Physical Exam   Wt 191 lb (86.6 kg)   BMI 30.83 kg/m   Physical Exam  Constitutional:  oriented to person, place, and time. appears well-developed and well-nourished. No distress.  HENT: [place]/AT, PERRLA, no scleral icterus Mouth/Throat: Oropharynx is clear and moist. No oropharyngeal exudate.  Cardiovascular: Normal rate, regular rhythm and normal heart sounds. Exam reveals no gallop and no friction rub.  No murmur heard.  Pulmonary/Chest: Effort normal and breath sounds normal. No respiratory distress.  has no wheezes.  Neck = supple, no nuchal rigidity Abdominal: Soft. Bowel sounds are normal.  exhibits no distension. There is no tenderness.  Lymphadenopathy: no cervical adenopathy. No axillary adenopathy Neurological: alert and oriented to person, place, and time.  Skin: Skin is warm and dry. No rash noted. No erythema.   Psychiatric: a normal mood and affect.  behavior is normal.     CBC Lab Results  Component Value Date   WBC 2.5 (L) 04/04/2023   RBC 3.98 04/04/2023   HGB 11.3 (L) 04/04/2023   HCT 35.8 (L) 04/04/2023   PLT 107 (L) 04/04/2023   MCV 89.9 04/04/2023   MCH 28.4 04/04/2023   MCHC 31.6 04/04/2023   RDW 14.3 04/04/2023   LYMPHSABS 0.7 04/04/2023   MONOABS 0.2 04/04/2023   EOSABS 0.0 04/04/2023    BMET Lab Results  Component Value Date   NA 137 04/04/2023   K 3.8 04/04/2023   CL 106 04/04/2023   CO2 22 04/04/2023   GLUCOSE 151 (H) 04/04/2023   BUN 15 04/04/2023   CREATININE 0.74 04/04/2023   CALCIUM 8.9 04/04/2023   GFRNONAA >60 04/04/2023   GFRAA >60 08/10/2020      Assessment and Plan BV =  Will do metronidazole 500mg  po bid x 7 days and fluconazole 200mg  daily x 7 days  Post infectious diarrhea = goes to get fluids, IVF,  cancer center through dr Myna Hidalgo when dehydrated. Sees GI soon to evaluate for management of diarrhea on 6/18.Marland Kitchen At this time does not need VOWST (oral FMT)

## 2023-04-07 ENCOUNTER — Ambulatory Visit: Payer: Self-pay

## 2023-04-07 ENCOUNTER — Ambulatory Visit: Payer: Self-pay | Admitting: Licensed Clinical Social Worker

## 2023-04-07 NOTE — Patient Outreach (Signed)
  Care Coordination   Care Coordination  Visit Note   04/07/2023 Name: Tami [name] MRN: [id number] DOB: 1956/10/29  Tami [name] is a 67 y.o. year old female who sees Tami Canary, MD for primary care.   What matters to the patients health and wellness today?  No patient contact was made during this encounter. RNCM received voice message from [facility]) regarding progress on personal care assistance. Patient is being followed by Tami Hines, LCSW to assist with personal care service resources. LCSW to reach out to Tami [name] and will follow up with RNCM as needed.  Goals Addressed             This Visit's Progress    COMPLETED: Care Coordination Activities       Interventions Today    Flowsheet Row Most Recent Value  General Interventions   General Interventions Discussed/Reviewed Communication with  Communication with Social Work  [spoke with Tami Hines, LCSW regarding Tami [name] request for information re: personal care services]            SDOH assessments and interventions completed:  No  Care Coordination Interventions:  Yes, provided   Follow up plan:  as previously scheduled. LCSW to reach out to Tami [name]    Encounter Outcome:  Pt. Visit Completed   Tami Sheriff, RN, MSN, BSN, CCM [facility] Care Coordinator [phone]

## 2023-04-07 NOTE — Patient Outreach (Signed)
  Care Coordination  Follow Up Visit Note   04/07/2023 Name: Tami [name] MRN: [id number] DOB: [birth year]  Tami [name] is a 67 y.o. year old female who sees Tami Canary, MD for primary care. I spoke with  Tami [name] by phone today.  What matters to the patients health and wellness today?  Getting additional support in her home.  Patient was accompanied by Tami [name] via 3 way phone call who provided information during this encounter. POA called, concerned that patient does not have in-home support with managing her health needs. Patient is experiencing difficulty with her memory and did not remember having the PCS assessment on May 17th..  Patient has given verbal permission for Tami [name] to be involved in her care as she does not have a local support system See activities low for next steps  Goals Addressed             This Visit's Progress    Get Personal Care Services       Activities and task to complete in order to accomplish goals.   Personal Care assessment has been completed  with [facility] [phone]. They are mailing out a letter with the information from the assessment results( look for the letter) We can also work on getting your MyChart set up. I have e-mail information on the CAPS program, we will discuss further during our next encounter         SDOH assessments and interventions completed:  No   Care Coordination Interventions:  Yes, provided  Interventions Today    Flowsheet Row Most Recent Value  Chronic Disease   Chronic disease during today's visit Hypertension (HTN), Congestive Heart Failure (CHF)  General Interventions   General Interventions Discussed/Reviewed General Interventions Reviewed, Level of Care, Communication with  Communication with RN  [patient's HPOA]  Level of Care Personal Care Services  [discussed barriers to getting PCS & e-mailed information on the CAPS program]  Education Interventions   Education Provided Provided  Education  Provided Verbal Education On Community Resources  Advanced Directive Interventions   Advanced Directives Discussed/Reviewed Advanced Directives Discussed  [patient states she has documents will provide copies]       Follow up plan:  No f/u scheduled with social work, HPOA will call patient once they receive PCS letter.  LCSW will call if no call is received in 3 weeks    Encounter Outcome:  Pt. Visit Completed   Tami Hines, LCSW Social Work Care Coordination  [facility] Emmie Niemann [employer] [phone]

## 2023-04-07 NOTE — Patient Instructions (Signed)
Social Work Visit Information  Thank you for taking time to visit with me today. Please don't hesitate to contact me if I can be of assistance to you.   Following are the goals we discussed today:   Goals Addressed             This Visit's Progress    Get Personal Care Services       Activities and task to complete in order to accomplish goals.   Personal Care assessment has been completed  with [facility] [phone]. They are mailing out a letter with the information from the assessment results( look for the letter) We can also work on getting your MyChart set up. I have e-mail information on the CAPS program, we will discuss further during our next encounter          Social work will continue to collaborate with the care team in order to meet patient's needs .  Will f/u in 3 week if no return call  Please call the care guide team at [phone] if you need to cancel or reschedule your appointment.    The patient verbalized understanding of instructions, educational materials, and care plan provided today and DECLINED offer to receive copy of patient instructions, educational materials, and care plan.     [name], LCSW Social Work Care Coordination  [facility] [employer] [phone]

## 2023-04-08 ENCOUNTER — Other Ambulatory Visit: Payer: Self-pay | Admitting: Family

## 2023-04-08 MED ORDER — CIPROFLOXACIN HCL 250 MG PO TABS: 250.0000 mg | ORAL_TABLET | Freq: Two times a day (BID) | ORAL | 0 refills | Status: AC

## 2023-04-09 ENCOUNTER — Inpatient Hospital Stay: Payer: 59

## 2023-04-09 VITALS — BP 139/73 | HR 78 | Resp 18

## 2023-04-09 DIAGNOSIS — E860 Dehydration: Secondary | ICD-10-CM

## 2023-04-09 DIAGNOSIS — R197 Diarrhea, unspecified: Secondary | ICD-10-CM | POA: Diagnosis not present

## 2023-04-09 DIAGNOSIS — Z5189 Encounter for other specified aftercare: Secondary | ICD-10-CM | POA: Diagnosis not present

## 2023-04-09 DIAGNOSIS — C9000 Multiple myeloma not having achieved remission: Secondary | ICD-10-CM | POA: Diagnosis not present

## 2023-04-09 MED ORDER — SODIUM CHLORIDE 0.9 % IV SOLN: INTRAVENOUS | Status: DC

## 2023-04-09 NOTE — Patient Instructions (Signed)

## 2023-04-12 ENCOUNTER — Ambulatory Visit: Payer: Self-pay

## 2023-04-12 NOTE — Patient Instructions (Signed)
Visit Information  Thank you for taking time to visit with me today. Please don't hesitate to contact me if I can be of assistance to you.   Following are the goals we discussed today:  Continue to take medications as prescribed Continue to attend provider visits as scheduled and notify provider if health questions or concerns Continue to eat healthy  Our next appointment is by telephone on 05/10/23 at 3:00 pm  Please call the care guide team at [phone] if you need to cancel or reschedule your appointment.   If you are experiencing a Mental Health or Behavioral Health Crisis or need someone to talk to, please call the Suicide and Crisis Lifeline: 78  Kathyrn Sheriff, RN, MSN, BSN, CCM [facility] Care Coordinator [phone]

## 2023-04-12 NOTE — Patient Outreach (Signed)
  Care Coordination   Follow Up Visit Note   04/12/2023 Name: Tami [name] MRN: [id number] DOB: 1956/08/13  Tami [name] is a 67 y.o. year old female who sees [name], MD for primary care. I spoke with  [name] and [name] Murchison(dpr) by phone today.  What matters to the patients health and wellness today?  [name] reports she is feeling a lot better since receiving IV infusion on 04/09/23. [name] state that patient has received an approval letter for [employer]. She denies any questions or concerns today, but states she is awaiting a return call from LCSW regarding the [facility] approval letter and next steps.  Goals Addressed             This Visit's Progress    Assistance with Health Management       Interventions Today    Flowsheet Row Most Recent Value  Chronic Disease   Chronic disease during today's visit Congestive Heart Failure (CHF), Hypertension (HTN), Other  [Chronic Diarrhea, multiple myeloma]  General Interventions   General Interventions Discussed/Reviewed General Interventions Reviewed, Doctor Visits  Doctor Visits Discussed/Reviewed Doctor Visits Reviewed, Doctor Visits Discussed, Specialist, PCP  [reviewed upcoming appointments]  PCP/Specialist Visits Compliance with follow-up visit  Level of Care Personal Care Services  [advised to discuss specific questions with LCSW regarding personal care service approval letter.]  Education Interventions   Education Provided Provided Education  Provided Verbal Education On Medication  [advised to continue to attend provider appointments as scheduled and as needed. recommended continue to remain hydrated, eat health per provider recommendations]  Pharmacy Interventions   Pharmacy Dicussed/Reviewed Pharmacy Topics Reviewed            SDOH assessments and interventions completed:  No  Care Coordination Interventions:  Yes, provided   Follow up plan: Follow up call scheduled for 05/10/23    Encounter  Outcome:  Pt. Visit Completed   Kathyrn Sheriff, RN, MSN, BSN, CCM [facility] Care Coordinator [phone]

## 2023-04-14 ENCOUNTER — Emergency Department (HOSPITAL_BASED_OUTPATIENT_CLINIC_OR_DEPARTMENT_OTHER)
Admission: EM | Admit: 2023-04-14 | Discharge: 2023-04-15 | Disposition: A | Discharge status: Home / Self Care | Payer: 59 | Attending: Emergency Medicine | Admitting: Emergency Medicine

## 2023-04-14 ENCOUNTER — Ambulatory Visit: Payer: Self-pay | Admitting: Licensed Clinical Social Worker

## 2023-04-14 ENCOUNTER — Encounter (HOSPITAL_BASED_OUTPATIENT_CLINIC_OR_DEPARTMENT_OTHER): Payer: Self-pay | Admitting: Emergency Medicine

## 2023-04-14 ENCOUNTER — Other Ambulatory Visit: Payer: Self-pay

## 2023-04-14 DIAGNOSIS — I509 Heart failure, unspecified: Secondary | ICD-10-CM | POA: Insufficient documentation

## 2023-04-14 DIAGNOSIS — Z79899 Other long term (current) drug therapy: Secondary | ICD-10-CM | POA: Insufficient documentation

## 2023-04-14 DIAGNOSIS — R000 Tachycardia, unspecified: Secondary | ICD-10-CM | POA: Insufficient documentation

## 2023-04-14 DIAGNOSIS — Z7982 Long term (current) use of aspirin: Secondary | ICD-10-CM | POA: Insufficient documentation

## 2023-04-14 DIAGNOSIS — R197 Diarrhea, unspecified: Secondary | ICD-10-CM | POA: Insufficient documentation

## 2023-04-14 DIAGNOSIS — D72819 Decreased white blood cell count, unspecified: Secondary | ICD-10-CM | POA: Insufficient documentation

## 2023-04-14 DIAGNOSIS — E860 Dehydration: Secondary | ICD-10-CM | POA: Insufficient documentation

## 2023-04-14 LAB — CBC WITH DIFFERENTIAL/PLATELET
Abs Immature Granulocytes: 0.01 10*3/uL (ref 0.00–0.07)
Basophils Absolute: 0 10*3/uL (ref 0.0–0.1)
Basophils Relative: 1 %
Eosinophils Absolute: 0.1 10*3/uL (ref 0.0–0.5)
Eosinophils Relative: 3 %
HCT: 36.2 % (ref 36.0–46.0)
Hemoglobin: 11.6 g/dL — ABNORMAL LOW (ref 12.0–15.0)
Immature Granulocytes: 0 %
Lymphocytes Relative: 47 %
Lymphs Abs: 1.1 10*3/uL (ref 0.7–4.0)
MCH: 28.9 pg (ref 26.0–34.0)
MCHC: 32 g/dL (ref 30.0–36.0)
MCV: 90 fL (ref 80.0–100.0)
Monocytes Absolute: 0.3 10*3/uL (ref 0.1–1.0)
Monocytes Relative: 11 %
Neutro Abs: 0.9 10*3/uL — ABNORMAL LOW (ref 1.7–7.7)
Neutrophils Relative %: 38 %
Platelets: 138 10*3/uL — ABNORMAL LOW (ref 150–400)
RBC: 4.02 MIL/uL (ref 3.87–5.11)
RDW: 13.9 % (ref 11.5–15.5)
WBC: 2.3 10*3/uL — ABNORMAL LOW (ref 4.0–10.5)
nRBC: 0 % (ref 0.0–0.2)

## 2023-04-14 LAB — COMPREHENSIVE METABOLIC PANEL
ALT: 17 U/L (ref 0–44)
AST: 27 U/L (ref 15–41)
Albumin: 3.2 g/dL — ABNORMAL LOW (ref 3.5–5.0)
Alkaline Phosphatase: 52 U/L (ref 38–126)
Anion gap: 6 (ref 5–15)
BUN: 16 mg/dL (ref 8–23)
CO2: 25 mmol/L (ref 22–32)
Calcium: 8.8 mg/dL — ABNORMAL LOW (ref 8.9–10.3)
Chloride: 106 mmol/L (ref 98–111)
Creatinine, Ser: 0.66 mg/dL (ref 0.44–1.00)
GFR, Estimated: >60 mL/min (ref >60)
Glucose, Bld: 121 mg/dL — ABNORMAL HIGH (ref 70–99)
Potassium: 3.8 mmol/L (ref 3.5–5.1)
Sodium: 137 mmol/L (ref 135–145)
Total Bilirubin: 0.2 mg/dL — ABNORMAL LOW (ref 0.3–1.2)
Total Protein: 9.1 g/dL — ABNORMAL HIGH (ref 6.5–8.1)

## 2023-04-14 MED ORDER — LACTATED RINGERS IV BOLUS
500.0000 mL | Freq: Once | INTRAVENOUS | Status: AC
  Administered 2023-04-14: 500 mL via INTRAVENOUS

## 2023-04-14 NOTE — Patient Instructions (Signed)
Social Work Visit Information  Thank you for taking time to visit with me today. Please don't hesitate to contact me if I can be of assistance to you.   Following are the goals we discussed today:   Goals Addressed             This Visit's Progress    Get Personal Care Services       Activities and task to complete in order to accomplish goals.   Personal Care assessment has been completed  with [facility] [phone].  Congratulations on getting approved for Personal Care Services:  Please call [facility] with additional questions on the agency. Once you have the agency you can call them directly  We can also work on getting your MyChart set up. I have e-mail information on the CAPS program, we will discuss further during our next encounter          No follow up scheduled with social work at this time. Will follow up in about 2 weeks .Patient will call office if needed prior to next encounter.  Please call the care guide team at [phone] if you need to cancel or reschedule your appointment.    The patient verbalized understanding of instructions, educational materials, and care plan provided today and DECLINED offer to receive copy of patient instructions, educational materials, and care plan.     [name], LCSW Social Work Care Coordination  [facility] [employer] [phone]

## 2023-04-14 NOTE — ED Triage Notes (Signed)
Pt reports recurrence of diarrhea; sts this is a chronic problem and sometimes she needs IVFs

## 2023-04-14 NOTE — ED Provider Notes (Signed)
Tami [name] EM[facility]GENCY DEPARTMENT AT [facility] Provider Note   CSN: [id number] Arrival date & time: 04/14/23  2050     History  Chief Complaint  Patient presents with   Diarrhea    Tami [name] is a 67 y.o. female.  The history is provided by the patient and medical records.  Diarrhea Tami [name] is a 67 y.o. female who presents to the Emergency Department complaining of diarrhea.  She presents to the emergency department for feeling like she needs IV fluids.  She has experienced diarrhea for the last 2 years and is having 3-5 bowel movements daily.  No hematochezia or melena.  She reports feeling tired and weak.  No fever, nausea, vomiting, dysuria, chest pain, difficulty breathing. Diarrhea for two years  She has a history of CHF, last EF of 30 to 35%, multiple myeloma in remission but followed closely, C. difficile several years ago with negative C. difficile test late May.       Home Medications Prior to Admission medications   Medication Sig Start Date End Date Taking? Authorizing Provider  acyclovir (ZOVIRAX) 400 MG tablet TAKE ONE (1) TABLET BY MOUTH TWO (2) TIMES DAILY Patient taking differently: 400 mg 2 (two) times daily. 08/29/22   Josph Macho, MD  albuterol (PROVENTIL) (2.5 MG/3ML) 0.083% nebulizer solution Take 3 mLs (2.5 mg total) by nebulization every 6 (six) hours as needed for wheezing or shortness of breath. 12/15/22   [name], MD  albuterol (VENTOLIN HFA) 108 (90 Base) MCG/ACT inhaler Inhale 2 puffs into the lungs every 6 (six) hours as needed for wheezing or shortness of breath. 12/07/22   [name], MD  amLODipine (NORVASC) 5 MG tablet Take 1 tablet (5 mg total) by mouth daily. 03/16/23   [name], MD  aspirin EC 81 MG tablet Take 81 mg by mouth daily. Swallow whole.    [provider]  budesonide (PULMICORT) 0.5 MG/2ML nebulizer solution Take 2 mLs (0.5 mg total) by nebulization 2 (two) times daily as needed  (cough, wheezing or shortness of breath). 12/31/22   Martina Sinner, MD  cetirizine (ZYRTEC) 10 MG tablet Take 10 mg by mouth at bedtime.    [provider]  Cholecalciferol (VITAMIN D3) 50 MCG (2000 UT) TABS Take 2,000 Units by mouth in the morning.    [provider]  cholestyramine light (PREVALITE) 4 g packet Take 4 g by mouth 2 (two) times daily.    [provider]  colestipol (COLESTID) 5 g granules Take 5 g by mouth 3 (three) times daily. Patient taking differently: Take 5 g by mouth 3 (three) times daily as needed. 03/16/23   [name], MD  dicyclomine (BENTYL) 10 MG capsule Take 1 capsule (10 mg total) by mouth 4 (four) times daily -  before meals and at bedtime. Patient taking differently: Take 10 mg by mouth 4 (four) times daily -  before meals and at bedtime. cramping 01/25/23 04/25/23  Judyann Munson, MD  fluconazole (DIFLUCAN) 200 MG tablet Take 1 tablet (200 mg total) by mouth daily. 04/06/23   Judyann Munson, MD  fluticasone (FLONASE) 50 MCG/ACT nasal spray Place 2 sprays into both nostrils daily. 01/07/20   Joy, Shawn C, PA-C  furosemide (LASIX) 20 MG tablet Take 20 mg by mouth daily as needed (fluid retention (feet swelling)). 07/03/20   [provider]  hydrOXYzine (ATARAX) 10 MG tablet TAKE ONE (1) TABLET BY MOUTH 3 TIMES DAILY AS  NEEDED FOR ITCHING Patient taking differently: Take 10 mg by mouth 3 (three) times daily as needed for itching. 03/03/23   [name], MD  hyoscyamine (LEVSIN SL) 0.125 MG SL tablet Place 1 tablet (0.125 mg total) under the tongue every 4 (four) hours as needed. Patient taking differently: Place 0.125 mg under the tongue every 4 (four) hours as needed for cramping. 05/21/22   [name], MD  loperamide (IMODIUM) 2 MG capsule Take 4 mg by mouth in the morning and at bedtime.    [provider]  losartan (COZAAR) 50 MG tablet Take 50 mg by mouth in the morning and at bedtime. 04/14/21   [provider]  Magnesium Glycinate 100 MG CAPS Take 200 mg by mouth at bedtime. 09/21/22   [provider]  meclizine (ANTIVERT) 12.5 MG tablet Take 12.5 mg by mouth 2 (two) times daily as needed for dizziness or nausea. 01/28/22   [provider]  meloxicam (MOBIC) 15 MG tablet Take 15 mg by mouth daily. 12/31/22   [provider]  metoprolol succinate (TOPROL-XL) 50 MG 24 hr tablet Take 1 tablet (50 mg total) by mouth 2 (two) times daily. Patient taking differently: Take 50 mg by mouth daily. 02/23/23   [name], MD  metroNIDAZOLE (FLAGYL) 500 MG tablet Take 1 tablet (500 mg total) by mouth 2 (two) times daily. 04/06/23   Judyann Munson, MD  Multiple Vitamin Brandt Loosen) TABS Take 1 tablet by mouth daily. 11/06/21   [provider]  nortriptyline (PAMELOR) 10 MG capsule Take 1 capsule (10 mg total) by mouth at bedtime. Patient taking differently: Take 10 mg by mouth at bedtime. As needed 01/12/23   [name], MD  oxyCODONE-acetaminophen (PERCOCET/ROXICET) 5-325 MG tablet Take 1 tablet by mouth every 6 (six) hours as needed for severe pain. 11/07/21   [provider]  potassium chloride SA (KLOR-CON M) 20 MEQ tablet Take 1 tablet (20 mEq total) by mouth daily. 01/15/23   [name], MD  Probiotic Product (ALIGN) 4 MG CAPS Take 1 capsule (4 mg total) by mouth daily. 03/16/23   [name], MD  prochlorperazine (COMPAZINE) 10 MG tablet Take 0.5 tablets (5 mg total) by mouth 2 (two) times daily as needed for nausea or vomiting. 03/16/23   [name], MD  spironolactone (ALDACTONE) 25 MG tablet Take 12.5 mg by mouth daily.    [provider]  Wheat Dextrin (BENEFIBER) POWD Take 1 Dose by mouth in the morning, at noon, and at bedtime. 03/16/23 04/15/23  [name], MD      Allergies    Benazepril, Ondansetron hcl, Codeine, and Morphine    Review of Systems   Review of Systems  Gastrointestinal:  Positive for diarrhea.  All  other systems reviewed and are negative.   Physical Exam Updated Vital Signs BP (!) 180/104   Pulse 82   Temp 97.7 F (36.5 C) (Oral)   Resp 18   Ht 5\' 6"  (1.676 m)   Wt 86.6 kg   SpO2 96%   BMI 30.81 kg/m  Physical Exam Vitals and nursing note reviewed.  Constitutional:      Appearance: She is well-developed.  HENT:     Head: Normocephalic and atraumatic.  Cardiovascular:     Rate and Rhythm: Regular rhythm. Tachycardia present.     Heart sounds: No murmur heard. Pulmonary:     Effort: Pulmonary effort is normal. No respiratory distress.  Breath sounds: Normal breath sounds.  Abdominal:     Palpations: Abdomen is soft.     Tenderness: There is no abdominal tenderness. There is no guarding or rebound.  Musculoskeletal:        General: No tenderness.  Skin:    General: Skin is warm and dry.  Neurological:     Mental Status: She is alert and oriented to person, place, and time.  Psychiatric:        Behavior: Behavior normal.     ED Results / Procedures / Treatments   Labs (all labs ordered are listed, but only abnormal results are displayed) Labs Reviewed  CBC WITH DIFFERENTIAL/PLATELET - Abnormal; Notable for the following components:      Result Value   WBC 2.3 (*)    Hemoglobin 11.6 (*)    Platelets 138 (*)    Neutro Abs 0.9 (*)    All other components within normal limits  COMPREHENSIVE METABOLIC PANEL - Abnormal; Notable for the following components:   Glucose, Bld 121 (*)    Calcium 8.8 (*)    Total Protein 9.1 (*)    Albumin 3.2 (*)    Total Bilirubin 0.2 (*)    All other components within normal limits  PATHOLOGIST SMEAR REVIEW    EKG None  Radiology No results found.  Procedures Procedures    Medications Ordered in ED Medications  lactated ringers bolus 500 mL (500 mLs Intravenous New Bag/Given 04/14/23 2356)    ED Course/ Medical Decision Making/ A&P                             Medical Decision Making Amount and/or Complexity  of Data Reviewed Labs: ordered.   Patient with history of CHF, multiple myeloma, chronic diarrhea here for evaluation of feeling of dehydration in setting of her chronic diarrhea.  She was mildly tachycardic at time of ED arrival, improved after IV fluid hydration.  Labs with stable leukopenia, no significant electrolyte abnormality.  She has no acute infectious symptoms at this time and no abdominal pain on examination.  After IV fluids she feels significantly improved.  Current clinical picture is not consistent with serious bacterial infection, GI bleed, recurrent C. difficile.  Discussed with patient findings of leukopenia and need for follow-up with oncology next week as scheduled with return precautions for new or concerning symptoms.        Final Clinical Impression(s) / ED Diagnoses Final diagnoses:  Diarrhea, unspecified type  Dehydration    Rx / DC Orders ED Discharge Orders     None         Tilden Fossa, MD 04/15/23 0101

## 2023-04-14 NOTE — Patient Outreach (Signed)
  Care Coordination   Follow Up Visit Note   04/14/2023 Name: AZAYLEA DEMBY MRN: [id number] DOB: 10-24-1956  RUTHANN ROATH is a 67 y.o. year old female who sees [name], MD for primary care. I spoke with  [name] ;s HPOA by phone today.  What matters to the patients health and wellness today?  Getting patient's personal care services started  Patient has been approved for PCS, however family continues to experience barriers with navigating next steps to get the service started.   Goals Addressed             This Visit's Progress    Get Personal Care Services       Activities and task to complete in order to accomplish goals.   Personal Care assessment has been completed  with [facility] [phone].  Congratulations on getting approved for Personal Care Services:  Please call [facility] with additional questions on the agency. Once you have the agency you can call them directly  We can also work on getting your MyChart set up. I have e-mail information on the CAPS program, we will discuss further during our next encounter         SDOH assessments and interventions completed:  Yes  Care Coordination Interventions:  Yes, provided  Interventions Today    Flowsheet Row Most Recent Value  Chronic Disease   Chronic disease during today's visit Hypertension (HTN), Congestive Heart Failure (CHF)  General Interventions   General Interventions Discussed/Reviewed General Interventions Reviewed, Level of Care  Level of Care Personal Care Services  [solution focused,  problem solving,  task centered]       Follow up plan:  No follow schedule at this time  with LCSW.  RN to f/u in July  Encounter Outcome:  Pt. Visit Completed   [name], LCSW Social Work Care Coordination  [facility] [employer] [phone]

## 2023-04-16 LAB — PATHOLOGIST SMEAR REVIEW

## 2023-04-20 DIAGNOSIS — G4733 Obstructive sleep apnea (adult) (pediatric): Secondary | ICD-10-CM | POA: Diagnosis not present

## 2023-04-22 ENCOUNTER — Inpatient Hospital Stay: Payer: 59

## 2023-04-22 ENCOUNTER — Inpatient Hospital Stay (HOSPITAL_BASED_OUTPATIENT_CLINIC_OR_DEPARTMENT_OTHER): Payer: 59 | Admitting: Hematology & Oncology

## 2023-04-22 ENCOUNTER — Inpatient Hospital Stay: Payer: 59 | Attending: Hematology & Oncology

## 2023-04-22 ENCOUNTER — Encounter: Payer: Self-pay | Admitting: Hematology & Oncology

## 2023-04-22 ENCOUNTER — Other Ambulatory Visit: Payer: Self-pay | Admitting: Oncology

## 2023-04-22 VITALS — BP 144/72 | HR 80 | Resp 18

## 2023-04-22 VITALS — BP 133/80 | HR 89 | Temp 97.9°F | Resp 20 | Ht 66.0 in | Wt 188.1 lb

## 2023-04-22 DIAGNOSIS — C9000 Multiple myeloma not having achieved remission: Secondary | ICD-10-CM | POA: Diagnosis not present

## 2023-04-22 DIAGNOSIS — Z79899 Other long term (current) drug therapy: Secondary | ICD-10-CM | POA: Diagnosis not present

## 2023-04-22 DIAGNOSIS — D801 Nonfamilial hypogammaglobulinemia: Secondary | ICD-10-CM

## 2023-04-22 DIAGNOSIS — R197 Diarrhea, unspecified: Secondary | ICD-10-CM | POA: Insufficient documentation

## 2023-04-22 DIAGNOSIS — E860 Dehydration: Secondary | ICD-10-CM | POA: Diagnosis not present

## 2023-04-22 LAB — CBC WITH DIFFERENTIAL (CANCER CENTER ONLY)
Abs Immature Granulocytes: 0 10*3/uL (ref 0.00–0.07)
Basophils Absolute: 0 10*3/uL (ref 0.0–0.1)
Basophils Relative: 1 %
Eosinophils Absolute: 0.1 10*3/uL (ref 0.0–0.5)
Eosinophils Relative: 3 %
HCT: 39.4 % (ref 36.0–46.0)
Hemoglobin: 12.4 g/dL (ref 12.0–15.0)
Immature Granulocytes: 0 %
Lymphocytes Relative: 37 %
Lymphs Abs: 0.8 10*3/uL (ref 0.7–4.0)
MCH: 28.6 pg (ref 26.0–34.0)
MCHC: 31.5 g/dL (ref 30.0–36.0)
MCV: 91 fL (ref 80.0–100.0)
Monocytes Absolute: 0.3 10*3/uL (ref 0.1–1.0)
Monocytes Relative: 11 %
Neutro Abs: 1.1 10*3/uL — ABNORMAL LOW (ref 1.7–7.7)
Neutrophils Relative %: 48 %
Platelet Count: 148 10*3/uL — ABNORMAL LOW (ref 150–400)
RBC: 4.33 MIL/uL (ref 3.87–5.11)
RDW: 14.1 % (ref 11.5–15.5)
WBC Count: 2.3 10*3/uL — ABNORMAL LOW (ref 4.0–10.5)
nRBC: 0 % (ref 0.0–0.2)

## 2023-04-22 LAB — CMP (CANCER CENTER ONLY)
ALT: 16 U/L (ref 0–44)
AST: 29 U/L (ref 15–41)
Albumin: 3.8 g/dL (ref 3.5–5.0)
Alkaline Phosphatase: 47 U/L (ref 38–126)
Anion gap: 9 (ref 5–15)
BUN: 20 mg/dL (ref 8–23)
CO2: 26 mmol/L (ref 22–32)
Calcium: 10.1 mg/dL (ref 8.9–10.3)
Chloride: 104 mmol/L (ref 98–111)
Creatinine: 0.81 mg/dL (ref 0.44–1.00)
GFR, Estimated: >60 mL/min (ref >60)
Glucose, Bld: 160 mg/dL — ABNORMAL HIGH (ref 70–99)
Potassium: 4.3 mmol/L (ref 3.5–5.1)
Sodium: 139 mmol/L (ref 135–145)
Total Bilirubin: 0.5 mg/dL (ref 0.3–1.2)
Total Protein: 9.4 g/dL — ABNORMAL HIGH (ref 6.5–8.1)

## 2023-04-22 LAB — LACTATE DEHYDROGENASE: LDH: 191 U/L (ref 98–192)

## 2023-04-22 MED ORDER — SODIUM CHLORIDE 0.9 % IV SOLN: INTRAVENOUS | Status: DC

## 2023-04-22 MED ORDER — ACYCLOVIR 400 MG PO TABS: 400.0000 mg | ORAL_TABLET | Freq: Two times a day (BID) | ORAL | 3 refills | Status: DC

## 2023-04-22 NOTE — Progress Notes (Signed)
Hematology and Oncology Follow Up Visit  Tami [name] [id number] 1955-12-13 67 y.o. 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  She has some problems with diarrhea.  She needs an IV fluid today.  I know this diarrhea has been on and off.  I think she did on some Flagyl for this.  She has chronically low white blood cells.  Her white blood cells are not low enough right now that we have to give her G-CSF.  When we last saw her, her myeloma studies are holding steady.  Her M spike was 2 g/dL.  The IgG level was 3100 mg/dL.  The Kappa light chain was 11.9 mg/dL.  She has had no issues with fever.  She has had no cardiac issues.  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  She has had no bleeding.  There is been no leg swelling.  Overall, I would say that her performance status is probably ECOG 1.   Medications:  Allergies as of 04/22/2023       Reactions   Benazepril Anaphylaxis, Swelling, Hives   angioedema Throat and lip swelling   Ondansetron Hcl Hives   Redness and hives post IV admin on 07/05/17   Codeine Nausea And Vomiting   Morphine Hives   Redness and hives noted post IV admin on 07/05/17        Medication List        Accurate as of April 22, 2023 11:27 AM. If you have any questions, ask your nurse or doctor.          STOP taking these medications    fluconazole 200 MG tablet Commonly known as: DIFLUCAN Stopped by: Josph Macho, MD       TAKE these medications    acyclovir 400 MG tablet Commonly known as: ZOVIRAX TAKE ONE (1) TABLET BY MOUTH TWO  (2) TIMES DAILY What changed: See the new instructions.   albuterol 108 (90 Base) MCG/ACT inhaler Commonly known as: VENTOLIN HFA Inhale 2 puffs into the lungs every 6 (six) hours as needed for wheezing or shortness of breath.   albuterol (2.5 MG/3ML) 0.083% nebulizer solution Commonly known as: PROVENTIL Take 3 mLs (2.5 mg total) by nebulization every 6 (six) hours as needed for wheezing or shortness of breath.   Align 4 MG Caps Take 1 capsule (4 mg total) by mouth daily.   amLODipine 5 MG tablet Commonly known as: NORVASC Take 1 tablet (5 mg total) by mouth daily.   aspirin EC 81 MG tablet Take 81 mg by mouth daily. Swallow whole.   budesonide 0.5 MG/2ML nebulizer solution Commonly known as: Pulmicort Take 2 mLs (0.5 mg total) by nebulization 2 (two) times daily as needed (cough, wheezing or shortness of breath).   cetirizine 10 MG tablet Commonly known as: ZYRTEC Take 10 mg by mouth at bedtime.   cholestyramine light 4 g packet Commonly known as: PREVALITE Take 4 g by mouth 2 (two) times daily.   colestipol 5 g granules Commonly known as: Colestid Take 5 g  by mouth 3 (three) times daily. What changed:  when to take this reasons to take this   dicyclomine 10 MG capsule Commonly known as: BENTYL Take 1 capsule (10 mg total) by mouth 4 (four) times daily -  before meals and at bedtime. What changed: additional instructions   fluticasone 50 MCG/ACT nasal spray Commonly known as: FLONASE Place 2 sprays into both nostrils daily.   furosemide 20 MG tablet Commonly known as: LASIX Take 20 mg by mouth daily as needed (fluid retention (feet swelling)).   hydrOXYzine 10 MG tablet Commonly known as: ATARAX TAKE ONE (1) TABLET BY MOUTH 3 TIMES DAILY AS NEEDED FOR ITCHING   hyoscyamine 0.125 MG SL tablet Commonly known as: LEVSIN SL Place 1 tablet (0.125 mg total) under the tongue every 4 (four) hours as needed. What changed: reasons to take this   loperamide 2 MG  capsule Commonly known as: IMODIUM Take 4 mg by mouth in the morning and at bedtime.   losartan 50 MG tablet Commonly known as: COZAAR Take 50 mg by mouth in the morning and at bedtime.   Magnesium Glycinate 100 MG Caps Take 200 mg by mouth at bedtime.   meclizine 12.5 MG tablet Commonly known as: ANTIVERT Take 12.5 mg by mouth 2 (two) times daily as needed for dizziness or nausea.   meloxicam 15 MG tablet Commonly known as: MOBIC Take 15 mg by mouth daily.   metoprolol succinate 50 MG 24 hr tablet Commonly known as: TOPROL-XL Take 1 tablet (50 mg total) by mouth 2 (two) times daily. What changed: when to take this   metroNIDAZOLE 500 MG tablet Commonly known as: Flagyl Take 1 tablet (500 mg total) by mouth 2 (two) times daily.   nortriptyline 10 MG capsule Commonly known as: Pamelor Take 1 capsule (10 mg total) by mouth at bedtime. What changed: additional instructions   oxyCODONE-acetaminophen 5-325 MG tablet Commonly known as: PERCOCET/ROXICET Take 1 tablet by mouth every 6 (six) hours as needed for severe pain.   potassium chloride SA 20 MEQ tablet Commonly known as: KLOR-CON M Take 1 tablet (20 mEq total) by mouth daily.   prochlorperazine 10 MG tablet Commonly known as: COMPAZINE Take 0.5 tablets (5 mg total) by mouth 2 (two) times daily as needed for nausea or vomiting.   Quintabs Tabs Take 1 tablet by mouth daily.   spironolactone 25 MG tablet Commonly known as: ALDACTONE Take 12.5 mg by mouth daily.   Vitamin D3 50 MCG (2000 UT) Tabs Take 2,000 Units by mouth in the morning.        Allergies:  Allergies  Allergen Reactions   Benazepril Anaphylaxis, Swelling and Hives    angioedema Throat and lip swelling   Ondansetron Hcl Hives    Redness and hives post IV admin on 07/05/17   Codeine Nausea And Vomiting   Morphine Hives    Redness and hives noted post IV admin on 07/05/17    Past Medical History, Surgical history, Social history, and  Family History were reviewed and updated.  Review of Systems: Review of Systems  Constitutional: Negative.   HENT: Negative.    Eyes: Negative.   Respiratory: Negative.    Cardiovascular: Negative.   Gastrointestinal: Negative.   Genitourinary: Negative.   Musculoskeletal: Negative.   Skin: Negative.   Neurological: Negative.   Endo/Heme/Allergies: Negative.   Psychiatric/Behavioral: Negative.        Physical Exam:  height is 5\' 6"  (1.676 m) and weight is 188 lb 1.9  oz (85.3 kg). Her oral temperature is 97.9 F (36.6 C). Her blood pressure is 133/80 and her pulse is 89. Her respiration is 20 and oxygen saturation is 100%.   Wt Readings from Last 3 Encounters:  04/22/23 188 lb 1.9 oz (85.3 kg)  04/14/23 190 lb 14.7 oz (86.6 kg)  04/06/23 191 lb (86.6 kg)    Physical Exam Vitals reviewed.  HENT:     Head: Normocephalic and atraumatic.  Eyes:     Pupils: Pupils are equal, round, and reactive to light.     Comments: Mild pallor of conjunctiva  Cardiovascular:     Rate and Rhythm: Normal rate and regular rhythm.     Heart sounds: Normal heart sounds.  Pulmonary:     Effort: Pulmonary effort is normal.     Breath sounds: Normal breath sounds.  Abdominal:     General: Bowel sounds are normal.     Palpations: Abdomen is soft.  Musculoskeletal:        General: No tenderness or deformity. Normal range of motion.     Cervical back: Normal range of motion.  Lymphadenopathy:     Cervical: No cervical adenopathy.  Skin:    General: Skin is warm and dry.     Findings: No erythema or rash.  Neurological:     Mental Status: She is alert and oriented to person, place, and time.  Psychiatric:        Behavior: Behavior normal.        Thought Content: Thought content normal.        Judgment: Judgment normal.     Lab Results  Component Value Date   WBC 2.3 (L) 04/22/2023   HGB 12.4 04/22/2023   HCT 39.4 04/22/2023   MCV 91.0 04/22/2023   PLT 148 (L) 04/22/2023   Lab  Results  Component Value Date   FERRITIN 402 (H) 02/22/2023   IRON 66 02/22/2023   TIBC 346 02/22/2023   UIBC 280 02/22/2023   IRONPCTSAT 19 02/22/2023   Lab Results  Component Value Date   RETICCTPCT 1.2 11/27/2022   RBC 4.33 04/22/2023   RETICCTABS 32,640 07/16/2016   Lab Results  Component Value Date   KPAFRELGTCHN 118.5 (H) 03/24/2023   LAMBDASER 7.3 03/24/2023   KAPLAMBRATIO 16.23 (H) 03/24/2023   Lab Results  Component Value Date   IGGSERUM 3,408 (H) 03/24/2023   IGA 182 03/24/2023   IGMSERUM 138 03/24/2023   Lab Results  Component Value Date   TOTALPROTELP 8.8 (H) 03/24/2023   ALBUMINELP 3.4 03/24/2023   A1GS 0.3 03/24/2023   A2GS 1.0 03/24/2023   BETS 1.2 03/24/2023   BETA2SER 0.5 07/16/2016   GAMS 2.8 (H) 03/24/2023   MSPIKE 2.0 (H) 03/24/2023   SPEI Comment 10/27/2022     Chemistry      Component Value Date/Time   NA 139 04/22/2023 0933   NA 143 11/08/2017 1127   NA 140 12/31/2016 1000   K 4.3 04/22/2023 0933   K 4.0 11/08/2017 1127   K 3.6 12/31/2016 1000   CL 104 04/22/2023 0933   CL 107 11/08/2017 1127   CO2 26 04/22/2023 0933   CO2 25 11/08/2017 1127   CO2 24 12/31/2016 1000   BUN 20 04/22/2023 0933   BUN 19 11/08/2017 1127   BUN 22.3 12/31/2016 1000   CREATININE 0.81 04/22/2023 0933   CREATININE 0.9 11/08/2017 1127   CREATININE 0.8 12/31/2016 1000      Component Value Date/Time   CALCIUM 10.1  04/22/2023 0933   CALCIUM 9.9 11/08/2017 1127   CALCIUM 9.5 12/31/2016 1000   ALKPHOS 47 04/22/2023 0933   ALKPHOS 44 11/08/2017 1127   ALKPHOS 51 12/31/2016 1000   AST 29 04/22/2023 0933   AST 28 12/31/2016 1000   ALT 16 04/22/2023 0933   ALT 36 11/08/2017 1127   ALT 16 12/31/2016 1000   BILITOT 0.5 04/22/2023 0933   BILITOT 0.46 12/31/2016 1000       Impression and Plan: Tami [name] is a very pleasant 67 yo African American female with a previous IgG kappa MGUS with progression to myeloma.   Today, her total protein was a little more  elevated.  Again we will have to watch this closely.  I been trying to avoid having to treat the myeloma because she has had such a difficult time with treatment.  We all want quality of life for her.  This is really the goal.  I know IV fluids helps her diarrhea.  How is the dehydration.  We will do this today.  She may go down to [place] in July.  She has a son who lives down there.  She will be going to the [place] in September with a cousin.  I would like to see her back in about a month or so.   Josph Macho, MD 6/13/202411:27 AM

## 2023-04-22 NOTE — Patient Instructions (Signed)

## 2023-04-23 DIAGNOSIS — G8929 Other chronic pain: Secondary | ICD-10-CM | POA: Diagnosis not present

## 2023-04-23 DIAGNOSIS — Z780 Asymptomatic menopausal state: Secondary | ICD-10-CM | POA: Diagnosis not present

## 2023-04-23 DIAGNOSIS — Z79899 Other long term (current) drug therapy: Secondary | ICD-10-CM | POA: Diagnosis not present

## 2023-04-23 DIAGNOSIS — E559 Vitamin D deficiency, unspecified: Secondary | ICD-10-CM | POA: Diagnosis not present

## 2023-04-23 DIAGNOSIS — M545 Low back pain, unspecified: Secondary | ICD-10-CM | POA: Diagnosis not present

## 2023-04-23 DIAGNOSIS — I10 Essential (primary) hypertension: Secondary | ICD-10-CM | POA: Diagnosis not present

## 2023-04-23 DIAGNOSIS — E785 Hyperlipidemia, unspecified: Secondary | ICD-10-CM | POA: Diagnosis not present

## 2023-04-23 DIAGNOSIS — I517 Cardiomegaly: Secondary | ICD-10-CM | POA: Diagnosis not present

## 2023-04-23 DIAGNOSIS — E611 Iron deficiency: Secondary | ICD-10-CM | POA: Diagnosis not present

## 2023-04-23 DIAGNOSIS — N183 Chronic kidney disease, stage 3 unspecified: Secondary | ICD-10-CM | POA: Diagnosis not present

## 2023-04-23 LAB — IGG, IGA, IGM
IgA: 191 mg/dL (ref 87–352)
IgG (Immunoglobin G), Serum: 3442 mg/dL — ABNORMAL HIGH (ref 586–1602)
IgM (Immunoglobulin M), Srm: 138 mg/dL (ref 26–217)

## 2023-04-23 LAB — KAPPA/LAMBDA LIGHT CHAINS
Kappa free light chain: 89.7 mg/L — ABNORMAL HIGH (ref 3.3–19.4)
Kappa, lambda light chain ratio: 14.95 — ABNORMAL HIGH (ref 0.26–1.65)
Lambda free light chains: 6 mg/L (ref 5.7–26.3)

## 2023-04-26 DIAGNOSIS — Z79899 Other long term (current) drug therapy: Secondary | ICD-10-CM | POA: Diagnosis not present

## 2023-04-27 ENCOUNTER — Ambulatory Visit (INDEPENDENT_AMBULATORY_CARE_PROVIDER_SITE_OTHER): Payer: 59 | Admitting: Family Medicine

## 2023-04-27 ENCOUNTER — Encounter: Payer: Self-pay | Admitting: Nurse Practitioner

## 2023-04-27 ENCOUNTER — Ambulatory Visit (INDEPENDENT_AMBULATORY_CARE_PROVIDER_SITE_OTHER): Payer: 59 | Admitting: Nurse Practitioner

## 2023-04-27 VITALS — BP 120/82 | HR 100 | Ht 66.0 in | Wt 190.2 lb

## 2023-04-27 VITALS — BP 130/80 | HR 93 | Temp 97.8°F | Resp 16 | Ht 66.0 in | Wt 190.0 lb

## 2023-04-27 DIAGNOSIS — R739 Hyperglycemia, unspecified: Secondary | ICD-10-CM | POA: Diagnosis not present

## 2023-04-27 DIAGNOSIS — F32A Depression, unspecified: Secondary | ICD-10-CM

## 2023-04-27 DIAGNOSIS — E860 Dehydration: Secondary | ICD-10-CM

## 2023-04-27 DIAGNOSIS — R1013 Epigastric pain: Secondary | ICD-10-CM | POA: Diagnosis not present

## 2023-04-27 DIAGNOSIS — I10 Essential (primary) hypertension: Secondary | ICD-10-CM

## 2023-04-27 DIAGNOSIS — R197 Diarrhea, unspecified: Secondary | ICD-10-CM | POA: Diagnosis not present

## 2023-04-27 DIAGNOSIS — C9000 Multiple myeloma not having achieved remission: Secondary | ICD-10-CM | POA: Diagnosis not present

## 2023-04-27 DIAGNOSIS — I5042 Chronic combined systolic (congestive) and diastolic (congestive) heart failure: Secondary | ICD-10-CM | POA: Diagnosis not present

## 2023-04-27 DIAGNOSIS — K580 Irritable bowel syndrome with diarrhea: Secondary | ICD-10-CM

## 2023-04-27 MED ORDER — HYOSCYAMINE SULFATE 0.125 MG SL SUBL: 0.1250 mg | SUBLINGUAL_TABLET | SUBLINGUAL | 1 refills | Status: DC | PRN

## 2023-04-27 MED ORDER — DIPHENOXYLATE-ATROPINE 2.5-0.025 MG PO TABS: 1.0000 | ORAL_TABLET | Freq: Three times a day (TID) | ORAL | 3 refills | Status: DC

## 2023-04-27 NOTE — Progress Notes (Unsigned)
Subjective:    [name], female    DOB: 03-03-1956, 67 y.o.   MRN: [id number]  No chief complaint on file.   HPI Discussed the use of AI scribe software for clinical note transcription with the patient, who gave verbal consent to proceed.  History of Present Illness            Past Medical History:  Diagnosis Date  . Abnormal SPEP 07/26/2016  . Arthritis    knees, hands  . Back pain 07/14/2016  . Bowel obstruction (HCC) 11/2014  . C. difficile diarrhea   . CHF (congestive heart failure) (HCC)   . Colitis   . Congestive heart failure (HCC) 02/24/2015   S/p pacemaker  . Depression   . Elevated sed rate 08/15/2017  . Epigastric pain 03/22/2017  . GERD (gastroesophageal reflux disease)   . Heart disease   . Hyperlipidemia   . Hypertension   . 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 Iron deficiency anemia due to chronic blood loss 05/28/2022  . Low back pain 07/14/2016  . 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 Multiple myeloma (HCC) 07/20/2018  . Multiple myeloma not having achieved remission (HCC) 07/20/2018  . Myalgia 12/31/2016  . Obstructive sleep apnea 03/31/2015  . Presence of permanent cardiac pacemaker   . SOB (shortness of breath) 04/09/2016  . Stroke (HCC)    TIAs  . UTI (urinary tract infection)     Past Surgical History:  Procedure Laterality Date  . ABDOMINAL HYSTERECTOMY     menorraghia, 2006, total  . BIOPSY  09/01/2022   Procedure: BIOPSY;  Surgeon: Lynann Bologna, MD;  Location: [facility];  Service: Gastroenterology;;  . Gilmer Callas    . COLONOSCOPY  2018   [facility] per patient  . COLONOSCOPY WITH PROPOFOL N/A 09/01/2022   Procedure: COLONOSCOPY WITH PROPOFOL;  Surgeon: Lynann Bologna, MD;  Location: [facility];  Service: Gastroenterology;  Laterality: N/A;  . ESOPHAGOGASTRODUODENOSCOPY  2018   [facility]   . ESOPHAGOGASTRODUODENOSCOPY (EGD) WITH PROPOFOL N/A  09/01/2022   Procedure: ESOPHAGOGASTRODUODENOSCOPY (EGD) WITH PROPOFOL;  Surgeon: Lynann Bologna, MD;  Location: [facility];  Service: Gastroenterology;  Laterality: N/A;  . KNEE SURGERY     right, repair torn torn cartialage  . PACEMAKER INSERTION  08/2014  . TONSILLECTOMY    . TUBAL LIGATION      Family History  Problem Relation Age of Onset  . Diabetes Mother   . Hypertension Mother   . Heart disease Mother        s/p 1 stent  . Hyperlipidemia Mother   . Arthritis Mother   . Cancer Father        COLON  . Colon cancer Father 27  . Irritable bowel syndrome Sister   . Hyperlipidemia Daughter   . Hypertension Daughter   . Hypertension Maternal Grandmother   . Arthritis Maternal Grandmother   . Heart disease Maternal Grandfather        MI  . Hypertension Maternal Grandfather   . Arthritis Maternal Grandfather   . Hypertension Son   . Esophageal cancer Neg Hx     Social History   Socioeconomic History  . Marital status: Divorced    Spouse name: [name]  . Number of children: 3  . Years of education: [name]  . Highest education level: High school graduate  Occupational History  . [name]  Tobacco Use  . Smoking status: Never    Passive  exposure: Never  . Smokeless tobacco: Never  Vaping Use  . Vaping Use: Never used  Substance and Sexual Activity  . Alcohol use: No  . Drug use: No  . Sexual activity: Not Currently  Other Topics Concern  . [name]  Social History Narrative   Lives at home alone   Retired   Caffeine: coffee   Social Determinants of Health   Financial Resource Strain: Low Risk  (10/17/2021)   Overall Financial Resource Strain (CARDIA)   . Difficulty of Paying Living Expenses: Not hard at all  Food Insecurity: No Food Insecurity (03/28/2023)   Hunger Vital Sign   . Worried About Programme researcher, broadcasting/film/video in the Last Year: Never true   . Ran Out of Food in the Last Year: Never true  Transportation Needs: No Transportation Needs  (03/28/2023)   PRAPARE - Transportation   . Lack of Transportation (Medical): No   . Lack of Transportation (Non-Medical): No  Physical Activity: Insufficiently Active (10/17/2021)   Exercise Vital Sign   . Days of Exercise per Week: 2 days   . Minutes of Exercise per Session: 30 min  Stress: No Stress Concern Present (01/21/2023)   [employer] of Occupational Health - Occupational Stress Questionnaire   . Feeling of Stress : Not at all  Social Connections: Moderately Isolated (10/17/2021)   Social Connection and Isolation Panel [NHANES]   . Frequency of Communication with Friends and Family: More than three times a week   . Frequency of Social Gatherings with Friends and Family: More than three times a week   . Attends Religious Services: More than 4 times per year   . Active Member of Clubs or Organizations: No   . Attends Banker Meetings: Never   . Marital Status: Divorced  Catering manager Violence: Not At Risk (03/28/2023)   Humiliation, Afraid, Rape, and Kick questionnaire   . Fear of Current or Ex-Partner: No   . Emotionally Abused: No   . Physically Abused: No   . Sexually Abused: No    Outpatient Medications Prior to Visit  Medication Sig Dispense Refill  . acyclovir (ZOVIRAX) 400 MG tablet Take 1 tablet (400 mg total) by mouth 2 (two) times daily. 60 tablet 3  . albuterol (PROVENTIL) (2.5 MG/3ML) 0.083% nebulizer solution Take 3 mLs (2.5 mg total) by nebulization every 6 (six) hours as needed for wheezing or shortness of breath. (Patient not taking: Reported on 04/22/2023) 150 mL 2  . albuterol (VENTOLIN HFA) 108 (90 Base) MCG/ACT inhaler Inhale 2 puffs into the lungs every 6 (six) hours as needed for wheezing or shortness of breath. (Patient not taking: Reported on 04/22/2023) 18 g 5  . amLODipine (NORVASC) 5 MG tablet Take 1 tablet (5 mg total) by mouth daily. 30 tablet 4  . aspirin EC 81 MG tablet Take 81 mg by mouth daily. Swallow whole.    . budesonide  (PULMICORT) 0.5 MG/2ML nebulizer solution Take 2 mLs (0.5 mg total) by nebulization 2 (two) times daily as needed (cough, wheezing or shortness of breath). (Patient not taking: Reported on 04/22/2023) 120 mL 11  . cetirizine (ZYRTEC) 10 MG tablet Take 10 mg by mouth at bedtime.    . Cholecalciferol (VITAMIN D3) 50 MCG (2000 UT) TABS Take 2,000 Units by mouth in the morning.    . cholestyramine light (PREVALITE) 4 g packet Take 4 g by mouth 2 (two) times daily.    . colestipol (COLESTID) 5 g granules Take 5  g by mouth 3 (three) times daily. (Patient taking differently: Take 5 g by mouth 3 (three) times daily as needed.) 750 g 5  . dicyclomine (BENTYL) 10 MG capsule Take 1 capsule (10 mg total) by mouth 4 (four) times daily -  before meals and at bedtime. (Patient taking differently: Take 10 mg by mouth 4 (four) times daily -  before meals and at bedtime. cramping) 120 capsule 2  . fluticasone (FLONASE) 50 MCG/ACT nasal spray Place 2 sprays into both nostrils daily. (Patient not taking: Reported on 04/22/2023) 16 g 0  . furosemide (LASIX) 20 MG tablet Take 20 mg by mouth daily as needed (fluid retention (feet swelling)). (Patient not taking: Reported on 04/22/2023)    . hydrOXYzine (ATARAX) 10 MG tablet TAKE ONE (1) TABLET BY MOUTH 3 TIMES DAILY AS NEEDED FOR ITCHING (Patient not taking: Reported on 04/22/2023) 30 tablet 2  . hyoscyamine (LEVSIN SL) 0.125 MG SL tablet Place 1 tablet (0.125 mg total) under the tongue every 4 (four) hours as needed. (Patient taking differently: Place 0.125 mg under the tongue every 4 (four) hours as needed for cramping.) 30 tablet 1  . loperamide (IMODIUM) 2 MG capsule Take 4 mg by mouth in the morning and at bedtime. (Patient not taking: Reported on 04/22/2023)    . losartan (COZAAR) 50 MG tablet Take 50 mg by mouth in the morning and at bedtime.    . Magnesium Glycinate 100 MG CAPS Take 200 mg by mouth at bedtime.    . meclizine (ANTIVERT) 12.5 MG tablet Take 12.5 mg by mouth  2 (two) times daily as needed for dizziness or nausea. (Patient not taking: Reported on 04/22/2023)    . meloxicam (MOBIC) 15 MG tablet Take 15 mg by mouth daily. (Patient not taking: Reported on 04/22/2023)    . metoprolol succinate (TOPROL-XL) 50 MG 24 hr tablet Take 1 tablet (50 mg total) by mouth 2 (two) times daily. (Patient taking differently: Take 50 mg by mouth daily.) 60 tablet 2  . metroNIDAZOLE (FLAGYL) 500 MG tablet Take 1 tablet (500 mg total) by mouth 2 (two) times daily. 14 tablet 0  . Multiple Vitamin (QUINTABS) TABS Take 1 tablet by mouth daily.    . nortriptyline (PAMELOR) 10 MG capsule Take 1 capsule (10 mg total) by mouth at bedtime. (Patient taking differently: Take 10 mg by mouth at bedtime. As needed) 30 capsule 1  . oxyCODONE-acetaminophen (PERCOCET/ROXICET) 5-325 MG tablet Take 1 tablet by mouth every 6 (six) hours as needed for severe pain.    . potassium chloride SA (KLOR-CON M) 20 MEQ tablet Take 1 tablet (20 mEq total) by mouth daily. 90 tablet 1  . Probiotic Product (ALIGN) 4 MG CAPS Take 1 capsule (4 mg total) by mouth daily. 30 capsule 5  . prochlorperazine (COMPAZINE) 10 MG tablet Take 0.5 tablets (5 mg total) by mouth 2 (two) times daily as needed for nausea or vomiting. (Patient not taking: Reported on 04/22/2023) 60 tablet 1  . spironolactone (ALDACTONE) 25 MG tablet Take 12.5 mg by mouth daily.     Facility-Administered Medications Prior to Visit  Medication Dose Route Frequency Provider Last Rate Last Admin  . nitroGLYCERIN (NITROSTAT) SL tablet 0.4 mg  0.4 mg Sublingual Once [name], MD        Allergies  Allergen Reactions  . Benazepril Anaphylaxis, Swelling and Hives    angioedema Throat and lip swelling  . Ondansetron Hcl Hives    Redness and hives post IV  admin on 07/05/17  . Codeine Nausea And Vomiting  . Morphine Hives    Redness and hives noted post IV admin on 07/05/17    Review of Systems  Constitutional:  Negative for fever and  malaise/fatigue.  HENT:  Negative for congestion.   Eyes:  Negative for blurred vision.  Respiratory:  Negative for shortness of breath.   Cardiovascular:  Negative for chest pain, palpitations and leg swelling.  Gastrointestinal:  Negative for abdominal pain, blood in stool and nausea.  Genitourinary:  Negative for dysuria and frequency.  Musculoskeletal:  Negative for falls.  Skin:  Negative for rash.  Neurological:  Negative for dizziness, loss of consciousness and headaches.  Endo/Heme/Allergies:  Negative for environmental allergies.  Psychiatric/Behavioral:  Negative for depression. The patient is not nervous/anxious.       Objective:    Physical Exam Constitutional:      General: She is not in acute distress.    Appearance: Normal appearance. She is well-developed. She is not toxic-appearing.  HENT:     Head: Normocephalic and atraumatic.     Right Ear: External ear normal.     Left Ear: External ear normal.     Nose: Nose normal.  Eyes:     General:        Right eye: No discharge.        Left eye: No discharge.     Conjunctiva/sclera: Conjunctivae normal.  Neck:     Thyroid: No thyromegaly.  Cardiovascular:     Rate and Rhythm: Normal rate and regular rhythm.     Heart sounds: Normal heart sounds. No murmur heard. Pulmonary:     Effort: Pulmonary effort is normal. No respiratory distress.     Breath sounds: Normal breath sounds.  Abdominal:     General: Bowel sounds are normal.     Palpations: Abdomen is soft.     Tenderness: There is no abdominal tenderness. There is no guarding.  Musculoskeletal:        General: Normal range of motion.     Cervical back: Neck supple.  Lymphadenopathy:     Cervical: No cervical adenopathy.  Skin:    General: Skin is warm and dry.  Neurological:     Mental Status: She is alert and oriented to person, place, and time.  Psychiatric:        Mood and Affect: Mood normal.        Behavior: Behavior normal.        Thought  Content: Thought content normal.        Judgment: Judgment normal.   There were no vitals taken for this visit. Wt Readings from Last 3 Encounters:  04/22/23 188 lb 1.9 oz (85.3 kg)  04/14/23 190 lb 14.7 oz (86.6 kg)  04/06/23 191 lb (86.6 kg)    Diabetic Foot Exam - Simple   No data filed    Lab Results  Component Value Date   WBC 2.3 (L) 04/22/2023   HGB 12.4 04/22/2023   HCT 39.4 04/22/2023   PLT 148 (L) 04/22/2023   GLUCOSE 160 (H) 04/22/2023   CHOL 167 03/16/2023   TRIG 118.0 03/16/2023   HDL 49.20 03/16/2023   LDLCALC 95 03/16/2023   ALT 16 04/22/2023   AST 29 04/22/2023   NA 139 04/22/2023   K 4.3 04/22/2023   CL 104 04/22/2023   CREATININE 0.81 04/22/2023   BUN 20 04/22/2023   CO2 26 04/22/2023   TSH 1.34 03/16/2023   INR 0.90 07/28/2018  HGBA1C 5.7 03/16/2023    Lab Results  Component Value Date   TSH 1.34 03/16/2023   Lab Results  Component Value Date   WBC 2.3 (L) 04/22/2023   HGB 12.4 04/22/2023   HCT 39.4 04/22/2023   MCV 91.0 04/22/2023   PLT 148 (L) 04/22/2023   Lab Results  Component Value Date   NA 139 04/22/2023   K 4.3 04/22/2023   CHLORIDE 108 12/31/2016   CO2 26 04/22/2023   GLUCOSE 160 (H) 04/22/2023   BUN 20 04/22/2023   CREATININE 0.81 04/22/2023   BILITOT 0.5 04/22/2023   ALKPHOS 47 04/22/2023   AST 29 04/22/2023   ALT 16 04/22/2023   PROT 9.4 (H) 04/22/2023   ALBUMIN 3.8 04/22/2023   CALCIUM 10.1 04/22/2023   ANIONGAP 9 04/22/2023   EGFR >90 12/31/2016   GFR 92.36 03/16/2023   Lab Results  Component Value Date   CHOL 167 03/16/2023   Lab Results  Component Value Date   HDL 49.20 03/16/2023   Lab Results  Component Value Date   LDLCALC 95 03/16/2023   Lab Results  Component Value Date   TRIG 118.0 03/16/2023   Lab Results  Component Value Date   CHOLHDL 3 03/16/2023   Lab Results  Component Value Date   HGBA1C 5.7 03/16/2023       Assessment & Plan:  Primary hypertension Assessment &  Plan: Well controlled, no changes to meds. Encouraged heart healthy diet such as the DASH diet and exercise as tolerated.     Chronic combined systolic and diastolic congestive heart failure (HCC) Assessment & Plan: No recent exacerbation   Depression, unspecified depression type Assessment & Plan: Continues to struggle with poor health and thus has a great deal of stress   Hyperglycemia Assessment & Plan: hgba1c acceptable, minimize simple carbs. Increase exercise as tolerated.    Hypomagnesemia Assessment & Plan: Supplement and monitor    Diarrhea, unspecified type Assessment & Plan: Was seen in ED recently with a flare   Dehydration Assessment & Plan: Hydrate and monitor      Assessment and Plan              Danise Edge, MD

## 2023-04-27 NOTE — Assessment & Plan Note (Signed)
Supplement and monitor 

## 2023-04-27 NOTE — Patient Instructions (Addendum)
We have sent Lomotil to your pharmacy  _______________________________________________________  If your blood pressure at your visit was 140/90 or greater, please contact your primary care physician to follow up on this.  _______________________________________________________  If you are age 69 or older, your body mass index should be between 23-30. Your Body mass index is 30.7 kg/m. If this is out of the aforementioned range listed, please consider follow up with your Primary Care Provider.  If you are age 29 or younger, your body mass index should be between 19-25. Your Body mass index is 30.7 kg/m. If this is out of the aformentioned range listed, please consider follow up with your Primary Care Provider.   ________________________________________________________  The [facility] providers would like to encourage you to use [facility] to communicate with providers for non-urgent requests or questions.  Due to long hold times on the telephone, sending your provider a message by [facility] may be a faster and more efficient way to get a response.  Please allow 48 business hours for a response.  Please remember that this is for non-urgent requests.  _______________________________________________________   I appreciate the  opportunity to care for you  Thank You   Willette Cluster, NP

## 2023-04-27 NOTE — Assessment & Plan Note (Signed)
Hydrate and monitor 

## 2023-04-27 NOTE — Assessment & Plan Note (Signed)
No recent exacerbation 

## 2023-04-27 NOTE — Assessment & Plan Note (Signed)
Following with oncology and stable.  

## 2023-04-27 NOTE — Assessment & Plan Note (Signed)
Well controlled, no changes to meds. Encouraged heart healthy diet such as the DASH diet and exercise as tolerated.  °

## 2023-04-27 NOTE — Assessment & Plan Note (Signed)
Continues to struggle with poor health and thus has a great deal of stress

## 2023-04-27 NOTE — Progress Notes (Signed)
Assessment and Plan   Primary GI: Lynann Bologna, MD  Brief Narrative:  67 y.o.  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 history of small bowel obstruction secondary to adhesions, hysterectomy, cholecystectomy,  Chronic N/V and diarrhea resulting in dehydration requiring IV fluids. Diarrhea thought to be combination of IBS, bile acid related diarrhea but picture also complicated by recurrent C-diff infections followed / treated by ID. Colonoscopy with random colon biopsies negative. Fecal elastase negative. Small bowel biopsies negative Recent repeat C-diff negative.  -She is taking Mg+. This has previously been addressed but apparently Cardiology needs her to continue the Mg+. She has a history of low Mg+ levels.   -Continue BID Prevalite.  -Will remove Colestipol from home med list. She isn't taking it.  -Continue Imodium -Will add Lomotil TID -She isn't taking Bentyl, will remove from home med list.  -Will see if SL Levsin helps- prescribed by PCP today -She will follow up with Dr. Chales Abrahams next month. Oncology, ID and GI are all trying to figure out why she continues to have diarrhea.  The vomiting is likely contributing to episodes of dehydration. She has compazine to take. Gastric emptying is normal. EGD in Oct 2023 unrevealing.  -Candidate for Lotronex?  Multiple myeloma. Has had problems with treatment toxicity.   Chronically low white blood cells, gets Neulasta sometimes. Followed by Dr. Myna Hidalgo.    History of Present Illness   Chief complaint: diarrhea  Randyl has previously been seen here for abdominal pain , nausea, vomiting and diarrhea. She has had an extensive GI workup including multiple CT scans, CT enterography , abdominal ultrasound , upper and lower endoscopies with biopsies, stool studies, gastric emptying  study.   Cali's most pressing Gi problems is that of chronic diarrhea. Felt to have IBS-D but also thought to have bile acid related diarrhea. Further complicating things, she has had recurring episodes of C-diff  since Nov 2022 and is followed by ID. Colonoscopy with colon biopsies in Oct 2023 negative for microscopic colitis or amyloid. Duodenal biopsies negative. Diarrhea leads to dehydration requiring IV fluids. She has multiple episodes of loose stool a day . Most recent C-diff study on 04/04/23 was negative but diarrhea persists despite cholestyramine, fiber, probiotic Align, bowel antispasmodics, and imodium.    Outpatient medications reviewed. She is not taking Bentyl though it is on home med list.so will remove it. Not take Colestipol ( taking Prevalite).so will remove from list.   PCP prescribed Levsin SL today, she will take that instead of Bentyl. Marland Kitchen    Previous GI Endoscopies / Labs / Imaging   Most recent EGD and colonoscopy October 2023 EGD -LA grade a reflux esophagitis.  Biopsied.  Normal stomach.  Biopsied.  Normal examined duodenum.  Biopsied. Colonoscopy  -Entire examined colon normal.  Biopsied.  Minimal sigmoid diverticulosis.  Examined portion of the ileum was normal.  Deep terminal ileal intubation was performed.  Biopsy.  Nonbleeding internal hemorrhoids.  Examination was otherwise normal..   Postprocedure she was given a trial of Bentyl.  Symptoms felt to be anxiety related  FINAL MICROSCOPIC DIAGNOSIS:  A. SMALL BOWEL, BIOPSY: - Benign small bowel mucosa with no significant pathologic changes  B. STOMACH, BIOPSY: - Gastric antral and oxyntic mucosa with no specific pathologic changes - Negative for H. pylori on HE stain - Negative for intestinal metaplasia or  malignancy  C. ESOPHAGUS, DISTAL, BIOPSY: - Benign squamous mucosa with reflux changes - Gastric cardiac mucosa with no specific pathologic changes - Negative for intestinal metaplasia, dysplasia or  malignancy  D. TERMINAL ILEUM, BIOPSY: - Benign small bowel mucosa with no significant pathologic changes  E. COLON, RANDOM, BIOPSY: - Benign colonic mucosa with no specific pathologic changes - Negative for increased intraepithelial lymphocytes or thickened subepithelial collagen table - Negative for dysplasia or malignancy       Latest Ref Rng & Units 04/22/2023    9:33 AM 04/14/2023    9:40 PM 04/04/2023   10:57 AM  Hepatic Function  Total Protein 6.5 - 8.1 g/dL 9.4  9.1  8.8   Albumin 3.5 - 5.0 g/dL 3.8  3.2  3.2   AST 15 - 41 U/L 29  27  36   ALT 0 - 44 U/L 16  17  21    Alk Phosphatase 38 - 126 U/L 47  52  58   Total Bilirubin 0.3 - 1.2 mg/dL 0.5  0.2  0.4        Latest Ref Rng & Units 04/22/2023    9:33 AM 04/14/2023    9:40 PM 04/04/2023   10:57 AM  CBC  WBC 4.0 - 10.5 K/uL 2.3  2.3  2.5   Hemoglobin 12.0 - 15.0 g/dL 16.1  09.6  04.5   Hematocrit 36.0 - 46.0 % 39.4  36.2  35.8   Platelets 150 - 400 K/uL 148  138  107      Past Medical History:  Diagnosis Date   Abnormal SPEP 07/26/2016   Arthritis    knees, hands   Back pain 07/14/2016   Bowel obstruction (HCC) 11/2014   C. 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   Multiple myeloma (HCC) 07/20/2018   Multiple myeloma not having achieved remission (HCC) 07/20/2018   Myalgia 12/31/2016   Obstructive sleep apnea 03/31/2015   Presence of permanent cardiac pacemaker    SOB (shortness of breath) 04/09/2016   Stroke (HCC)    TIAs   UTI (urinary tract  infection)     Past Surgical History:  Procedure Laterality Date   ABDOMINAL HYSTERECTOMY     menorraghia, 2006, total   BIOPSY  09/01/2022   Procedure: BIOPSY;  Surgeon: Lynann Bologna, MD;  Location: [facility];  Service: Gastroenterology;;   CHOLECYSTECTOMY     COLONOSCOPY  2018   [facility] per patient   COLONOSCOPY WITH PROPOFOL N/A 09/01/2022   Procedure: COLONOSCOPY WITH PROPOFOL;  Surgeon: Lynann Bologna, MD;  Location: [facility];  Service: Gastroenterology;  Laterality: N/A;   ESOPHAGOGASTRODUODENOSCOPY  2018   [facility]    ESOPHAGOGASTRODUODENOSCOPY (EGD) WITH PROPOFOL N/A 09/01/2022   Procedure: ESOPHAGOGASTRODUODENOSCOPY (EGD) WITH PROPOFOL;  Surgeon: Lynann Bologna, MD;  Location: [facility];  Service: Gastroenterology;  Laterality: N/A;   KNEE SURGERY     right, repair torn torn cartialage   PACEMAKER INSERTION  08/2014   TONSILLECTOMY     TUBAL  LIGATION      Current Medications, Allergies, Family History and Social History were reviewed in [employer] record.     Current Outpatient Medications  Medication Sig Dispense Refill   acyclovir (ZOVIRAX) 400 MG tablet Take 1 tablet (400 mg total) by mouth 2 (two) times daily. 60 tablet 3   albuterol (PROVENTIL) (2.5 MG/3ML) 0.083% nebulizer solution Take 3 mLs (2.5 mg total) by nebulization every 6 (six) hours as needed for wheezing or shortness of breath. 150 mL 2   albuterol (VENTOLIN HFA) 108 (90 Base) MCG/ACT inhaler Inhale 2 puffs into the lungs every 6 (six) hours as needed for wheezing or shortness of breath. 18 g 5   amLODipine (NORVASC) 5 MG tablet Take 1 tablet (5 mg total) by mouth daily. 30 tablet 4   aspirin EC 81 MG tablet Take 81 mg by mouth daily. Swallow whole.     budesonide (PULMICORT) 0.5 MG/2ML nebulizer solution Take 2 mLs (0.5 mg total) by nebulization 2 (two) times daily as needed (cough, wheezing or shortness of breath). 120 mL 11   cetirizine  (ZYRTEC) 10 MG tablet Take 10 mg by mouth at bedtime.     Cholecalciferol (VITAMIN D3) 50 MCG (2000 UT) TABS Take 2,000 Units by mouth in the morning.     cholestyramine light (PREVALITE) 4 g packet Take 4 g by mouth 2 (two) times daily.     diphenoxylate-atropine (LOMOTIL) 2.5-0.025 MG tablet Take 1 tablet by mouth 3 (three) times daily. As needed  for diarrhea 60 tablet 3   fluticasone (FLONASE) 50 MCG/ACT nasal spray Place 2 sprays into both nostrils daily. 16 g 0   furosemide (LASIX) 20 MG tablet Take 20 mg by mouth daily as needed (fluid retention (feet swelling)).     hydrOXYzine (ATARAX) 10 MG tablet TAKE ONE (1) TABLET BY MOUTH 3 TIMES DAILY AS NEEDED FOR ITCHING 30 tablet 2   hyoscyamine (LEVSIN SL) 0.125 MG SL tablet Place 1 tablet (0.125 mg total) under the tongue every 4 (four) hours as needed. 30 tablet 1   loperamide (IMODIUM) 2 MG capsule Take 4 mg by mouth in the morning and at bedtime.     losartan (COZAAR) 50 MG tablet Take 50 mg by mouth in the morning and at bedtime.     Magnesium Glycinate 100 MG CAPS Take 200 mg by mouth at bedtime.     meclizine (ANTIVERT) 12.5 MG tablet Take 12.5 mg by mouth 2 (two) times daily as needed for dizziness or nausea.     meloxicam (MOBIC) 15 MG tablet Take 15 mg by mouth daily.     metoprolol succinate (TOPROL-XL) 50 MG 24 hr tablet Take 1 tablet (50 mg total) by mouth 2 (two) times daily. (Patient taking differently: Take 50 mg by mouth daily.) 60 tablet 2   Multiple Vitamin (QUINTABS) TABS Take 1 tablet by mouth daily.     nortriptyline (PAMELOR) 10 MG capsule Take 1 capsule (10 mg total) by mouth at bedtime. (Patient taking differently: Take 10 mg by mouth at bedtime. As needed) 30 capsule 1   oxyCODONE-acetaminophen (PERCOCET/ROXICET) 5-325 MG tablet Take 1 tablet by mouth every 6 (six) hours as needed for severe pain.     potassium chloride SA (KLOR-CON M) 20 MEQ tablet Take 1 tablet (20 mEq total) by mouth daily. 90 tablet 1   Probiotic  Product (ALIGN) 4 MG CAPS Take 1 capsule (4 mg total) by mouth daily. 30 capsule 5   prochlorperazine (COMPAZINE)  10 MG tablet Take 0.5 tablets (5 mg total) by mouth 2 (two) times daily as needed for nausea or vomiting. 60 tablet 1   spironolactone (ALDACTONE) 25 MG tablet Take 12.5 mg by mouth daily.     dicyclomine (BENTYL) 10 MG capsule Take 1 capsule (10 mg total) by mouth 4 (four) times daily -  before meals and at bedtime. (Patient taking differently: Take 10 mg by mouth 4 (four) times daily -  before meals and at bedtime. cramping) 120 capsule 2   Current Facility-Administered Medications  Medication Dose Route Frequency Provider Last Rate Last Admin   nitroGLYCERIN (NITROSTAT) SL tablet 0.4 mg  0.4 mg Sublingual Once [name], MD        Review of Systems: No chest pain. No shortness of breath. No urinary complaints.    Physical Exam  Wt Readings from Last 3 Encounters:  04/27/23 190 lb 3.2 oz (86.3 kg)  04/27/23 190 lb (86.2 kg)  04/22/23 188 lb 1.9 oz (85.3 kg)    BP 120/82   Pulse 100   Ht 5\' 6"  (1.676 m)   Wt 190 lb 3.2 oz (86.3 kg)   BMI 30.70 kg/m  Constitutional:  Pleasant, generally well appearing female in no acute distress. Psychiatric: Normal mood and affect. Behavior is normal. EENT: Pupils normal.  Conjunctivae are normal. No scleral icterus. Neck supple.  Cardiovascular: Normal rate, regular rhythm.  Pulmonary/chest: Effort normal and breath sounds normal. No wheezing, rales or rhonchi. Abdominal: Soft, nondistended, nontender. Bowel sounds active throughout. There are no masses palpable. No hepatomegaly. Neurological: Alert and oriented to person place and time.  Skin: Skin is warm and dry. No rashes noted.  Willette Cluster, NP  04/27/2023, 1:27 PM  Cc:  [name], MD

## 2023-04-27 NOTE — Assessment & Plan Note (Signed)
Try Hyoscyamine prn

## 2023-04-27 NOTE — Patient Instructions (Signed)
Diarrhea, Adult Diarrhea is frequent loose and sometimes watery bowel movements. Diarrhea can make you feel weak and cause you to become dehydrated. Dehydration is a condition in which there is not enough water or other fluids in the body. Dehydration can make you tired and thirsty, cause you to have a dry mouth, and decrease how often you urinate. Diarrhea typically lasts 2-3 days. However, it can last longer if it is a sign of something more serious. It is important to treat your diarrhea as told by your health care provider. Follow these instructions at home: Eating and drinking     Follow these recommendations as told by your health care provider: Take an oral rehydration solution (ORS). This is an over-the-counter medicine that helps return your body to its normal balance of nutrients and water. It is found at pharmacies and retail stores. Drink enough fluid to keep your urine pale yellow. Drink fluids such as water, diluted fruit juice, and low-calorie sports drinks. You can drink milk also, if desired. Sucking on ice chips is another way to get fluids. Avoid drinking fluids that contain a lot of sugar or caffeine, such as soda, energy drinks, and regular sports drinks. Avoid alcohol. Eat bland, easy-to-digest foods in small amounts as you are able. These foods include bananas, applesauce, rice, lean meats, toast, and crackers. Avoid spicy or fatty foods.  Medicines Take over-the-counter and prescription medicines only as told by your health care provider. If you were prescribed antibiotics, take them as told by your health care provider. Do not stop using the antibiotic even if you start to feel better. General instructions  Wash your hands often using soap and water for at least 20 seconds. If soap and water are not available, use hand sanitizer. Others in the household should wash their hands as well. Hands should be washed: After using the toilet or changing a diaper. Before  preparing, cooking, or serving food. While caring for a sick person or while visiting someone in a hospital. Rest at home while you recover. Take a warm bath to relieve any burning or pain from frequent diarrhea episodes. Watch your condition for any changes. Contact a health care provider if: You have a fever. Your diarrhea gets worse. You have new symptoms. You vomit every time you eat or drink. You feel light-headed, dizzy, or have a headache. You have muscle cramps. You have signs of dehydration, such as: Dark urine, very little urine, or no urine. Cracked lips. Dry mouth. Sunken eyes. Sleepiness. Weakness. You have bloody or black stools or stools that look like tar. You have severe pain, cramping, or bloating in your abdomen. Your skin feels cold and clammy. You feel confused. Get help right away if: You have chest pain or your heart is beating very quickly. You have trouble breathing or you are breathing very quickly. You feel extremely weak or you faint. These symptoms may be an emergency. Get help right away. Call 911. Do not wait to see if the symptoms will go away. Do not drive yourself to the hospital. This information is not intended to replace advice given to you by your health care provider. Make sure you discuss any questions you have with your health care provider. Document Revised: 04/14/2022 Document Reviewed: 04/14/2022 Elsevier Patient Education  2024 Else[employer] Inc.  

## 2023-04-27 NOTE — Assessment & Plan Note (Addendum)
Was seen in ED recently with a flare is having 3 loose stool a day and that is when she feels weak and dehydrated. They are having trouble keeping her hydrated even with frequently getting IVF has had them once to twice over the past couple of weeks. She notes feeling hot and feverish internally in her belly. Threw up last night and she still throws up about twice daily every other day. She feels the pain starts in the epigastrium and then vomiting ensues. Once she vomits the pain is better and she feels better after some ginger ale and ice.

## 2023-04-27 NOTE — Assessment & Plan Note (Signed)
hgba1c acceptable, minimize simple carbs. Increase exercise as tolerated.  

## 2023-04-28 ENCOUNTER — Encounter: Payer: Self-pay | Admitting: Family Medicine

## 2023-04-29 ENCOUNTER — Inpatient Hospital Stay: Payer: 59

## 2023-04-29 VITALS — BP 136/64 | HR 74 | Temp 98.0°F | Resp 17

## 2023-04-29 DIAGNOSIS — C9000 Multiple myeloma not having achieved remission: Secondary | ICD-10-CM | POA: Diagnosis not present

## 2023-04-29 DIAGNOSIS — E860 Dehydration: Secondary | ICD-10-CM

## 2023-04-29 DIAGNOSIS — R197 Diarrhea, unspecified: Secondary | ICD-10-CM | POA: Diagnosis not present

## 2023-04-29 DIAGNOSIS — Z79899 Other long term (current) drug therapy: Secondary | ICD-10-CM | POA: Diagnosis not present

## 2023-04-29 LAB — IMMUNOFIXATION REFLEX, SERUM
IgA: 218 mg/dL (ref 87–352)
IgG (Immunoglobin G), Serum: 4077 mg/dL — ABNORMAL HIGH (ref 586–1602)
IgM (Immunoglobulin M), Srm: 152 mg/dL (ref 26–217)

## 2023-04-29 LAB — PROTEIN ELECTROPHORESIS, SERUM, WITH REFLEX
A/G Ratio: 0.6 — ABNORMAL LOW (ref 0.7–1.7)
Albumin ELP: 3.3 g/dL (ref 2.9–4.4)
Alpha-1-Globulin: 0.2 g/dL (ref 0.0–0.4)
Alpha-2-Globulin: 1 g/dL (ref 0.4–1.0)
Beta Globulin: 1.1 g/dL (ref 0.7–1.3)
Gamma Globulin: 3 g/dL — ABNORMAL HIGH (ref 0.4–1.8)
Globulin, Total: 5.3 g/dL — ABNORMAL HIGH (ref 2.2–3.9)
M-Spike, %: 2.4 g/dL — ABNORMAL HIGH
SPEP Interpretation: 0
Total Protein ELP: 8.6 g/dL — ABNORMAL HIGH (ref 6.0–8.5)

## 2023-04-29 MED ORDER — SODIUM CHLORIDE 0.9 % IV SOLN: INTRAVENOUS | Status: DC

## 2023-04-29 MED ORDER — HEPARIN SOD (PORK) LOCK FLUSH 100 UNIT/ML IV SOLN: 500.0000 [IU] | Freq: Once | INTRAVENOUS | Status: DC

## 2023-04-29 MED ORDER — SODIUM CHLORIDE 0.9 % IV SOLN: Freq: Once | INTRAVENOUS | Status: DC

## 2023-04-29 MED ORDER — SODIUM CHLORIDE 0.9% FLUSH: 10.0000 mL | INTRAVENOUS | Status: DC | PRN

## 2023-04-29 NOTE — Patient Instructions (Signed)
Dehydration, Adult Dehydration is a condition in which there is not enough water or other fluids in the body. This happens when a person loses more fluids than they take in. Important organs cannot work right without the right amount of fluids. Any loss of fluids from the body can cause dehydration. Dehydration can be mild, worse, or very bad. It should be treated right away to keep it from getting very bad. What are the causes? Conditions that cause loss of water in the body. They include: Watery poop (diarrhea). Vomiting. Sweating a lot. Fever. Infection. Peeing (urinating) a lot. Not drinking enough fluids. Certain medicines, such as medicines that take extra fluid out of the body (diuretics). Lack of safe drinking water. Not being able to get enough water and food. What increases the risk? Having a long-term (chronic) illness that has not been treated the right way, such as: Diabetes. Heart disease. Kidney disease. Being 65 years of age or older. Having a disability. Living in a place that is high above the ground or sea (high in altitude). The thinner, drier air causes more fluid loss. Doing exercises that put stress on your body for a long time. Being active when in hot places. What are the signs or symptoms? Symptoms of dehydration depend on how bad it is. Mild or worse dehydration Thirst. Dry lips or dry mouth. Feeling dizzy or light-headed. Muscle cramps. Passing little pee or dark pee. Pee may be the color of tea. Headache. Very bad dehydration Changes in skin. Skin may: Be cold to the touch (clammy). Be blotchy or pale. Not go back to normal right after you pinch it and let it go. Little or no tears, pee, or sweat. Fast breathing. Low blood pressure. Weak pulse. Pulse that is more than 100 beats a minute when you are sitting still. Other changes, such as: Feeling very thirsty. Eyes that look hollow (sunken). Cold hands and feet. Being confused. Being very  tired (lethargic) or having trouble waking from sleep. Losing weight. Loss of consciousness. How is this treated? Treatment for this condition depends on how bad your dehydration is. Treatment should start right away. Do not wait until your condition gets very bad. Very bad dehydration is an emergency. You will need to go to a hospital. Mild or worse dehydration can be treated at home. You may be asked to: Drink more fluids. Drink an oral rehydration solution (ORS). This drink gives you the right amount of fluids, salts, and minerals (electrolytes). Very bad dehydration can be treated: With fluids through an IV tube. By correcting low levels of electrolytes in the body. By treating the problem that caused your dehydration. Follow these instructions at home: Oral rehydration solution If told by your doctor, drink an ORS: Make an ORS. Use instructions on the package. Start by drinking small amounts, about  cup (120 mL) every 5-10 minutes. Slowly drink more until you have had the amount that your doctor said to have.  Eating and drinking  Drink enough clear fluid to keep your pee pale yellow. If you were told to drink an ORS, finish the ORS first. Then, start slowly drinking other clear fluids. Drink fluids such as: Water. Do not drink only water. Doing that can make the salt (sodium) level in your body get too low. Water from ice chips you suck on. Fruit juice that you have added water to (diluted). Low-calorie sports drinks. Eat foods that have the right amounts of salts and minerals, such as bananas, oranges, potatoes,   tomatoes, or spinach. Do not drink alcohol. Avoid drinks that have caffeine or sugar. These include:: High-calorie sports drinks. Fruit juice that you did not add water to. Soda. Coffee or energy drinks. Avoid foods that are greasy or have a lot of fat or sugar. General instructions Take over-the-counter and prescription medicines only as told by your doctor. Do  not take sodium tablets. Doing that can make the salt level in your body get too high. Return to your normal activities as told by your doctor. Ask your doctor what activities are safe for you. Keep all follow-up visits. Your doctor may check and change your treatment. Contact a doctor if: You have pain in your belly (abdomen) and the pain: Gets worse. Stays in one place. You have a rash. You have a stiff neck. You get angry or annoyed more easily than normal. You are more tired or have a harder time waking than normal. You feel weak or dizzy. You feel very thirsty. Get help right away if: You have any symptoms of very bad dehydration. You vomit every time you eat or drink. Your vomiting gets worse, does not go away, or you vomit blood or green stuff. You are getting treatment, but symptoms are getting worse. You have a fever. You have a very bad headache. You have: Diarrhea that gets worse or does not go away. Blood in your poop (stool). This may cause poop to look black and tarry. No pee in 6-8 hours. Only a small amount of pee in 6-8 hours, and the pee is very dark. You have trouble breathing. These symptoms may be an emergency. Get help right away. Call 911. Do not wait to see if the symptoms will go away. Do not drive yourself to the hospital. This information is not intended to replace advice given to you by your health care provider. Make sure you discuss any questions you have with your health care provider. Document Revised: 05/25/2022 Document Reviewed: 05/25/2022 Elsevier Patient Education  2024 [employer]vier Inc.  

## 2023-05-03 ENCOUNTER — Ambulatory Visit (INDEPENDENT_AMBULATORY_CARE_PROVIDER_SITE_OTHER): Payer: 59 | Admitting: Primary Care

## 2023-05-03 ENCOUNTER — Ambulatory Visit (INDEPENDENT_AMBULATORY_CARE_PROVIDER_SITE_OTHER): Payer: 59 | Admitting: Pulmonary Disease

## 2023-05-03 ENCOUNTER — Ambulatory Visit: Payer: Self-pay | Admitting: Licensed Clinical Social Worker

## 2023-05-03 ENCOUNTER — Encounter: Payer: Self-pay | Admitting: Primary Care

## 2023-05-03 VITALS — BP 140/88 | HR 90 | Temp 97.4°F | Ht 66.0 in | Wt 191.4 lb

## 2023-05-03 DIAGNOSIS — J4520 Mild intermittent asthma, uncomplicated: Secondary | ICD-10-CM | POA: Diagnosis not present

## 2023-05-03 DIAGNOSIS — G4733 Obstructive sleep apnea (adult) (pediatric): Secondary | ICD-10-CM

## 2023-05-03 LAB — PULMONARY FUNCTION TEST
DL/VA % pred: 86 %
DL/VA: 3.54 ml/min/mmHg/L
DLCO cor % pred: 67 %
DLCO cor: 14.05 ml/min/mmHg
DLCO unc % pred: 62 %
DLCO unc: 13.05 ml/min/mmHg
FEF 25-75 Post: 2.46 L/sec
FEF 25-75 Pre: 2.14 L/sec
FEF2575-%Change-Post: 15 %
FEF2575-%Pred-Post: 116 %
FEF2575-%Pred-Pre: 101 %
FEV1-%Change-Post: 3 %
FEV1-%Pred-Post: 76 %
FEV1-%Pred-Pre: 73 %
FEV1-Post: 1.92 L
FEV1-Pre: 1.86 L
FEV1FVC-%Change-Post: 1 %
FEV1FVC-%Pred-Pre: 110 %
FEV6-%Change-Post: 1 %
FEV6-%Pred-Post: 70 %
FEV6-%Pred-Pre: 69 %
FEV6-Post: 2.22 L
FEV6-Pre: 2.2 L
FEV6FVC-%Pred-Post: 104 %
FEV6FVC-%Pred-Pre: 104 %
FVC-%Change-Post: 1 %
FVC-%Pred-Post: 67 %
FVC-%Pred-Pre: 66 %
FVC-Post: 2.22 L
FVC-Pre: 2.2 L
Post FEV1/FVC ratio: 86 %
Post FEV6/FVC ratio: 100 %
Pre FEV1/FVC ratio: 84 %
Pre FEV6/FVC Ratio: 100 %
RV % pred: 90 %
RV: 2.03 L
TLC % pred: 80 %
TLC: 4.3 L

## 2023-05-03 NOTE — Progress Notes (Signed)
@Patient  ID: Tami [name], female    DOB: [birth year], 67 y.o.   MRN: [id number]  Chief Complaint  Patient presents with   Follow-up    Doing well.  Review PFT today    Referring provider: [name], MD  HPI: 67 year old female, never smoked.  Past medical history significant for heart failure, cardiomyopathy, hypertension, stroke, obstructive sleep apnea, asthma, multiple myeloma.  Previous [facility] pulmonary encounter: 12/31/22- Dr. Loyal Gambler Tami [name] is a 67 year old woman, never smoker with history of OSA on CPAP, multiple myeloma, congestive heart failure, and chronic diarrhea who is referred to pulmonary clinic for asthma.   She has been using albuterol inhaler as needed 1-3 times per week. She has significant exertional dyspnea, but feels this is related to her heart failure. She is using CPAP at night for OSA. She does have seasonal allergies more so in the summer that can affect her breathing. She does have acid reflux. She is being treated for chronic diarrhea by GI. She reports intermittent episodes of severe dehydration due to the loose stools/diarrhea in which she needs IV fluids.   She is a never smoker. She lives alone. She had second hand smoke from her mother in childhood. Her mother had CHF.   05/03/2023- interim hx  Patient presents today for 3-4 month follow-up with PFTs. She is doing well today. Breathing has been alright. She can do all ADLs, she is not limited by her breathing. Maintained on budesonide nebulizer twice daily and requires albuterol rarely. She is compliant with CPAP nightly. She has been to ED a couple of times for diarrhea. She follows with gastroenterologist for this.    Allergies  Allergen Reactions   Benazepril Anaphylaxis, Swelling and Hives    angioedema Throat and lip swelling   Ondansetron Hcl Hives    Redness and hives post IV admin on 07/05/17   Codeine Nausea And Vomiting   Morphine Hives    Redness and hives noted post IV admin  on 07/05/17    Immunization History  Administered Date(s) Administered   COVID-19, mRNA, vaccine(Comirnaty)12 years and older 09/08/2022   Fluad Quad(high Dose 65+) 07/13/2019, 07/30/2020, 08/07/2021, 08/25/2022   Influenza Split 07/14/2016, 07/27/2017   Influenza Whole 09/09/2009, 09/12/2013, 07/12/2015   Influenza,inj,Quad PF,6+ Mos 07/14/2016, 07/27/2017, 07/07/2018   Influenza-Unspecified 09/09/2009, 09/12/2013, 08/23/2014, 07/06/2015, 07/14/2016, 07/27/2017, 07/07/2018   PFIZER(Purple Top)SARS-COV-2 Vaccination 12/15/2019, 01/05/2020, 11/15/2020, 03/17/2021   Pneumococcal Conjugate-13 07/12/2015, 06/02/2021   Pneumococcal Polysaccharide-23 09/06/2014, 07/12/2015   Pneumococcal-Unspecified 07/06/2015   Tdap 12/01/2010, 10/11/2015   Zoster Recombinant(Shingrix) 09/15/2022, 01/25/2023   Zoster, Live 10/11/2015, 09/15/2016    Past Medical History:  Diagnosis Date   Abnormal SPEP 07/26/2016   Arthritis    knees, hands   Back pain 07/14/2016   Bowel obstruction (HCC) 11/2014   C. 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   Multiple myeloma (HCC) 07/20/2018   Multiple myeloma not having achieved remission (HCC) 07/20/2018   Myalgia 12/31/2016  Obstructive sleep apnea 03/31/2015   Presence of permanent cardiac pacemaker    SOB (shortness of breath) 04/09/2016   Stroke (HCC)    TIAs   UTI (urinary tract infection)     Tobacco History: Social History   Tobacco Use  Smoking Status Never   Passive exposure: Never   Smokeless Tobacco Never   Counseling given: Not Answered   Outpatient Medications Prior to Visit  Medication Sig Dispense Refill   acyclovir (ZOVIRAX) 400 MG tablet Take 1 tablet (400 mg total) by mouth 2 (two) times daily. 60 tablet 3   albuterol (PROVENTIL) (2.5 MG/3ML) 0.083% nebulizer solution Take 3 mLs (2.5 mg total) by nebulization every 6 (six) hours as needed for wheezing or shortness of breath. 150 mL 2   albuterol (VENTOLIN HFA) 108 (90 Base) MCG/ACT inhaler Inhale 2 puffs into the lungs every 6 (six) hours as needed for wheezing or shortness of breath. 18 g 5   amLODipine (NORVASC) 5 MG tablet Take 1 tablet (5 mg total) by mouth daily. 30 tablet 4   aspirin EC 81 MG tablet Take 81 mg by mouth daily. Swallow whole.     budesonide (PULMICORT) 0.5 MG/2ML nebulizer solution Take 2 mLs (0.5 mg total) by nebulization 2 (two) times daily as needed (cough, wheezing or shortness of breath). 120 mL 11   cetirizine (ZYRTEC) 10 MG tablet Take 10 mg by mouth at bedtime.     Cholecalciferol (VITAMIN D3) 50 MCG (2000 UT) TABS Take 2,000 Units by mouth in the morning.     cholestyramine light (PREVALITE) 4 g packet Take 4 g by mouth 2 (two) times daily.     diphenoxylate-atropine (LOMOTIL) 2.5-0.025 MG tablet Take 1 tablet by mouth 3 (three) times daily. As needed  for diarrhea 60 tablet 3   fluticasone (FLONASE) 50 MCG/ACT nasal spray Place 2 sprays into both nostrils daily. 16 g 0   furosemide (LASIX) 20 MG tablet Take 20 mg by mouth daily as needed (fluid retention (feet swelling)).     hydrOXYzine (ATARAX) 10 MG tablet TAKE ONE (1) TABLET BY MOUTH 3 TIMES DAILY AS NEEDED FOR ITCHING 30 tablet 2   hyoscyamine (LEVSIN SL) 0.125 MG SL tablet Place 1 tablet (0.125 mg total) under the tongue every 4 (four) hours as needed. 30 tablet 1   loperamide (IMODIUM) 2 MG capsule Take 4 mg by mouth in the morning and at bedtime.     losartan (COZAAR) 50 MG tablet Take 50 mg by mouth in the morning and at  bedtime.     Magnesium Glycinate 100 MG CAPS Take 200 mg by mouth at bedtime.     meclizine (ANTIVERT) 12.5 MG tablet Take 12.5 mg by mouth 2 (two) times daily as needed for dizziness or nausea.     meloxicam (MOBIC) 15 MG tablet Take 15 mg by mouth daily.     metoprolol succinate (TOPROL-XL) 50 MG 24 hr tablet Take 1 tablet (50 mg total) by mouth 2 (two) times daily. (Patient taking differently: Take 50 mg by mouth daily.) 60 tablet 2   Multiple Vitamin (QUINTABS) TABS Take 1 tablet by mouth daily.     nortriptyline (PAMELOR) 10 MG capsule Take 1 capsule (10 mg total) by mouth at bedtime. (Patient taking differently: Take 10 mg by mouth at bedtime. As needed) 30 capsule 1   oxyCODONE-acetaminophen (PERCOCET/ROXICET) 5-325 MG tablet Take 1 tablet by mouth every 6 (six) hours as needed for severe pain.     potassium chloride  SA (KLOR-CON M) 20 MEQ tablet Take 1 tablet (20 mEq total) by mouth daily. 90 tablet 1   Probiotic Product (ALIGN) 4 MG CAPS Take 1 capsule (4 mg total) by mouth daily. 30 capsule 5   prochlorperazine (COMPAZINE) 10 MG tablet Take 0.5 tablets (5 mg total) by mouth 2 (two) times daily as needed for nausea or vomiting. 60 tablet 1   spironolactone (ALDACTONE) 25 MG tablet Take 12.5 mg by mouth daily.     dicyclomine (BENTYL) 10 MG capsule Take 1 capsule (10 mg total) by mouth 4 (four) times daily -  before meals and at bedtime. (Patient taking differently: Take 10 mg by mouth 4 (four) times daily -  before meals and at bedtime. cramping) 120 capsule 2   Facility-Administered Medications Prior to Visit  Medication Dose Route Frequency Provider Last Rate Last Admin   nitroGLYCERIN (NITROSTAT) SL tablet 0.4 mg  0.4 mg Sublingual Once [name], MD       Review of Systems  Review of Systems  Constitutional: Negative.  Negative for fatigue.  Respiratory: Negative.  Negative for apnea, cough and shortness of breath.   Gastrointestinal:  Positive for diarrhea.   Musculoskeletal:  Positive for arthralgias.  Psychiatric/Behavioral:  Negative for sleep disturbance.      Physical Exam  BP (!) 140/88 (BP Location: Right Arm, Patient Position: Sitting, Cuff Size: Large)   Pulse 90   Temp (!) 97.4 F (36.3 C) (Oral)   Ht 5\' 6"  (1.676 m)   Wt 191 lb 6.4 oz (86.8 kg)   SpO2 98%   BMI 30.89 kg/m  Physical Exam Constitutional:      Appearance: Normal appearance.  HENT:     Mouth/Throat:     Mouth: Mucous membranes are moist.     Pharynx: Oropharynx is clear.  Cardiovascular:     Rate and Rhythm: Normal rate and regular rhythm.  Pulmonary:     Effort: Pulmonary effort is normal.     Breath sounds: Normal breath sounds.  Abdominal:     General: Abdomen is flat.     Palpations: Abdomen is soft.  Musculoskeletal:        General: Normal range of motion.  Skin:    General: Skin is warm and dry.  Neurological:     General: No focal deficit present.     Mental Status: She is alert and oriented to person, place, and time. Mental status is at baseline.  Psychiatric:        Mood and Affect: Mood normal.        Behavior: Behavior normal.        Thought Content: Thought content normal.        Judgment: Judgment normal.      Lab Results:  CBC    Component Value Date/Time   WBC 2.3 (L) 04/22/2023 0933   WBC 2.3 (L) 04/14/2023 2140   RBC 4.33 04/22/2023 0933   HGB 12.4 04/22/2023 0933   HGB 11.1 (L) 11/08/2017 1127   HCT 39.4 04/22/2023 0933   HCT 34.1 (L) 11/08/2017 1127   PLT 148 (L) 04/22/2023 0933   PLT 145 11/08/2017 1127   MCV 91.0 04/22/2023 0933   MCV 88 11/08/2017 1127   MCH 28.6 04/22/2023 0933   MCHC 31.5 04/22/2023 0933   RDW 14.1 04/22/2023 0933   RDW 14.6 11/08/2017 1127   LYMPHSABS 0.8 04/22/2023 0933   LYMPHSABS 0.9 11/08/2017 1127   MONOABS 0.3 04/22/2023 0933   EOSABS 0.1 04/22/2023  0933   EOSABS 0.1 11/08/2017 1127   BASOSABS 0.0 04/22/2023 0933   BASOSABS 0.0 11/08/2017 1127    BMET    Component Value  Date/Time   NA 139 04/22/2023 0933   NA 143 11/08/2017 1127   NA 140 12/31/2016 1000   K 4.3 04/22/2023 0933   K 4.0 11/08/2017 1127   K 3.6 12/31/2016 1000   CL 104 04/22/2023 0933   CL 107 11/08/2017 1127   CO2 26 04/22/2023 0933   CO2 25 11/08/2017 1127   CO2 24 12/31/2016 1000   GLUCOSE 160 (H) 04/22/2023 0933   GLUCOSE 96 11/08/2017 1127   BUN 20 04/22/2023 0933   BUN 19 11/08/2017 1127   BUN 22.3 12/31/2016 1000   CREATININE 0.81 04/22/2023 0933   CREATININE 0.9 11/08/2017 1127   CREATININE 0.8 12/31/2016 1000   CALCIUM 10.1 04/22/2023 0933   CALCIUM 9.9 11/08/2017 1127   CALCIUM 9.5 12/31/2016 1000   GFRNONAA >60 04/22/2023 0933   GFRNONAA 82 09/18/2016 1152   GFRAA >60 08/10/2020 1119   GFRAA >60 07/26/2020 1352   GFRAA >89 09/18/2016 1152    BNP No results found for: "BNP"  ProBNP No results found for: "PROBNP"  Imaging: No results found.   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

## 2023-05-03 NOTE — Assessment & Plan Note (Addendum)
-   PFTs today without evidence of COPD or obstructive lung disease; moderate restrictive lung disease due to weight and scoliosis - Continue budesonide nebulizer 0.5mg /91ml twice daily and prn Albuterol due to clinical benefit

## 2023-05-03 NOTE — Patient Instructions (Signed)
Social Work Visit Information  Thank you for taking time to visit with me today. Please don't hesitate to contact me if I can be of assistance to you.   Following are the goals we discussed today:   Goals Addressed             This Visit's Progress    Get Personal Care Services       Activities and task to complete in order to accomplish goals.   Personal Care has started please contact [facility] [phone] if you have any questions or concerns.  We can also work on Scientific laboratory technician set up. I have e-mail information on the CAPS program, we will discuss further during our next encounter          Our next appointment is by telephone on 06/02/23    Please call the care guide team at [phone] if you need to cancel or reschedule your appointment.   If you or anyone you know are experiencing a Mental Health or Behavioral Health Crisis or need someone to talk to, please call the Suicide and Crisis Lifeline: 988 call the [place] National Suicide Prevention Lifeline: 848-472-7756 or TTY: 220-151-8753 TTY (450) 027-4349) to talk to a trained counselor call 1-800-273-TALK (toll free, 24 hour hotline) go to [facility] [address] [place] [phone])   The patient verbalized understanding of instructions, educational materials, and care plan provided today and DECLINED offer to receive copy of patient instructions, educational materials, and care plan.     [name], LCSW Social Work Care Coordination  [facility] [employer] [phone]

## 2023-05-03 NOTE — Patient Outreach (Signed)
  Care Coordination  Follow Up Visit Note   05/03/2023 Name: TENNELLE TAFLINGER MRN: [id number] DOB: [birth year]  MAIREN WALLENSTEIN is a 67 y.o. year old female who sees [name], MD for primary care. I spoke with  [facility] POA by phone today.  What matters to the patients health and wellness today?  Patient's personal care service aid started a few weeks ago. Per [name] Patient is adjusting well to her aide.  They will revisit CAP services during out next encounter in 30 days   Goals Addressed             This Visit's Progress    Get Personal Care Services       Activities and task to complete in order to accomplish goals.   Personal Care has started please contact [place] [facility] [phone] if you have any questions or concerns.  We can also work on Scientific laboratory technician set up. I have e-mail information on the CAPS program, we will discuss further during our next encounter         SDOH assessments and interventions completed:  No   Care Coordination Interventions:  Yes, provided  Interventions Today    Flowsheet Row Most Recent Value  Chronic Disease   Chronic disease during today's visit Hypertension (HTN), Congestive Heart Failure (CHF)  General Interventions   General Interventions Discussed/Reviewed Level of Care  [discussed CAPS]  Level of Care Personal Care Services  [services have started]       Follow up plan: Follow up call scheduled for 06/02/23    Encounter Outcome:  Pt. Visit Completed   [name], LCSW Social Work Care Coordination  [facility] [employer] [phone]

## 2023-05-03 NOTE — Assessment & Plan Note (Addendum)
Continue CPAP nightly. °

## 2023-05-03 NOTE — Progress Notes (Signed)
Full PFT completed today ? ?

## 2023-05-03 NOTE — Patient Instructions (Addendum)
Breathing test showed no evidence of COPD or obstructive lung disease You have moderate restrictive lung disease due to weight and scoliosis Continue budesonide nebulizer morning and evening Use Albuterol 2 puffs every 4-6 hours as needed for breakthrough shortness of breath/wheezing  Follow BRAT diet for diarrhea (watch dairy and eggs, avoid raw vegetables and fruit)  Continue to wear CPAP nightly   Follow-up: 4 months with Dr. Francine Graven    Food Choices to Help Relieve Diarrhea, Adult Diarrhea can make you feel weak and cause you to become dehydrated. Dehydration is a condition in which there is not enough water or other fluids in the body. It is important to choose the right foods and drinks to: Relieve diarrhea. Replace lost fluids and nutrients. Prevent dehydration. What are tips for following this plan? Relieving diarrhea Avoid foods that make your diarrhea worse. These may include: Foods and drinks that are sweetened with high-fructose corn syrup, honey, or sweeteners such as xylitol, sorbitol, and mannitol. Check food labels for these ingredients. Fried, greasy, or spicy foods. Raw fruits and vegetables. Eat foods that are rich in probiotics. These include foods such as yogurt and fermented milk products. Probiotics can help increase healthy bacteria in your stomach and intestines (gastrointestinal or GI tract). This may help digestion and stop diarrhea. If you have lactose intolerance, avoid dairy products. These may make your diarrhea worse. Take medicine to help stop diarrhea only as told by your health care provider. Replacing nutrients  Eat bland, easy-to-digest foods in small amounts as you are able, until your diarrhea starts to get better. These foods include bananas, applesauce, rice, toast, and crackers. Over time, add nutrient-rich foods as your body tolerates them or as told by your health care provider. These include: Well-cooked protein foods, such as eggs, lean meats  like fish or chicken without skin, and tofu. Peeled, seeded, and soft-cooked fruits and vegetables. Low-fat dairy products. Whole grains. Take vitamin and mineral supplements as told by your health care provider. Preventing dehydration  Start by sipping water or a solution to prevent dehydration (oral rehydration solution, or ORS). This is a drink that helps replace fluids and minerals your body has lost. You can buy an ORS at pharmacies and retail stores. Try to drink at least 8-10 cups (2,000-2,500 mL) of fluid each day to help replace lost fluids. If your urine is pale yellow, you are getting enough fluids. You may drink other liquids in addition to water, such as fruit juice that you have added water to (diluted fruit juice) or low-calorie sports drinks, as tolerated or as told by your health care provider. Avoid drinks with caffeine, such as coffee, tea, or soft drinks. Avoid alcohol. This information is not intended to replace advice given to you by your health care provider. Make sure you discuss any questions you have with your health care provider. Document Revised: 04/14/2022 Document Reviewed: 04/14/2022 Elsevier Patient Education  2024 [employer].

## 2023-05-10 ENCOUNTER — Telehealth: Payer: Self-pay

## 2023-05-10 NOTE — Assessment & Plan Note (Signed)
-   Encourage BRAT diet and oral hydration - Needs to follow-up with GI

## 2023-05-10 NOTE — Patient Outreach (Signed)
  Care Coordination   05/10/2023 Name: Tami [name] MRN: [id number] DOB: [birth year]   Care Coordination Outreach Attempts:  An unsuccessful telephone outreach was attempted for a scheduled appointment today.  Follow Up Plan:  Additional outreach attempts will be made to offer the patient care coordination information and services.   Encounter Outcome:  No Answer   Care Coordination Interventions:  No, not indicated    Kathyrn Sheriff, RN, MSN, BSN, CCM [facility] Care Coordinator [phone]

## 2023-05-11 DIAGNOSIS — I5042 Chronic combined systolic (congestive) and diastolic (congestive) heart failure: Secondary | ICD-10-CM | POA: Diagnosis not present

## 2023-05-16 DIAGNOSIS — Z45018 Encounter for adjustment and management of other part of cardiac pacemaker: Secondary | ICD-10-CM | POA: Diagnosis not present

## 2023-05-16 DIAGNOSIS — I4729 Other ventricular tachycardia: Secondary | ICD-10-CM | POA: Diagnosis not present

## 2023-05-17 DIAGNOSIS — I5042 Chronic combined systolic (congestive) and diastolic (congestive) heart failure: Secondary | ICD-10-CM | POA: Diagnosis not present

## 2023-05-18 ENCOUNTER — Ambulatory Visit: Payer: 59

## 2023-05-18 ENCOUNTER — Other Ambulatory Visit: Payer: 59

## 2023-05-19 ENCOUNTER — Ambulatory Visit (INDEPENDENT_AMBULATORY_CARE_PROVIDER_SITE_OTHER): Payer: 59 | Admitting: Gastroenterology

## 2023-05-19 ENCOUNTER — Encounter: Payer: Self-pay | Admitting: Gastroenterology

## 2023-05-19 VITALS — BP 120/80 | HR 95 | Ht 66.0 in | Wt 193.0 lb

## 2023-05-19 DIAGNOSIS — I5042 Chronic combined systolic (congestive) and diastolic (congestive) heart failure: Secondary | ICD-10-CM | POA: Diagnosis not present

## 2023-05-19 DIAGNOSIS — R197 Diarrhea, unspecified: Secondary | ICD-10-CM | POA: Diagnosis not present

## 2023-05-19 MED ORDER — DIPHENOXYLATE-ATROPINE 2.5-0.025 MG PO TABS: 1.0000 | ORAL_TABLET | Freq: Two times a day (BID) | ORAL | 6 refills | Status: DC

## 2023-05-19 NOTE — Patient Instructions (Signed)
_______________________________________________________  If your blood pressure at your visit was 140/90 or greater, please contact your primary care physician to follow up on this.  _______________________________________________________  If you are age 69 or older, your body mass index should be between 23-30. Your Body mass index is 31.15 kg/m. If this is out of the aforementioned range listed, please consider follow up with your Primary Care Provider.  If you are age 62 or younger, your body mass index should be between 19-25. Your Body mass index is 31.15 kg/m. If this is out of the aformentioned range listed, please consider follow up with your Primary Care Provider.   ________________________________________________________  The  GI providers would like to encourage you to use [facility] to communicate with providers for non-urgent requests or questions.  Due to long hold times on the telephone, sending your provider a message by [facility] may be a faster and more efficient way to get a response.  Please allow 48 business hours for a response.  Please remember that this is for non-urgent requests.  _______________________________________________________  Script for lomotil has been given. Please take to pharmacy.  Call with any questions or concerns  Thank you,  Dr. Lynann Bologna

## 2023-05-19 NOTE — Progress Notes (Signed)
Signed           Assessment and Plan    Primary GI: Lynann Bologna, MD   Brief Narrative:  67 y.o.  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 history of small bowel obstruction secondary to adhesions, hysterectomy, cholecystectomy,   Chronic N/V and diarrhea resulting in dehydration requiring IV fluids. Diarrhea thought to be combination of IBS, bile acid related diarrhea but picture also complicated by recurrent C-diff infections followed / treated by ID. Colonoscopy with random colon biopsies negative. Fecal elastase negative. Small bowel biopsies negative Recent repeat C-diff negative.  -She is taking Mg+. This has previously been addressed but apparently Cardiology needs her to continue the Mg+. She has a history of low Mg+ levels.   -Continue BID Prevalite.  -Will remove Colestipol from home med list. She isn't taking it.  -Continue Imodium -Will add Lomotil TID -She isn't taking Bentyl, will remove from home med list.  -Will see if SL Levsin helps- prescribed by PCP today -She will follow up with Dr. Chales Abrahams next month. Oncology, ID and GI are all trying to figure out why she continues to have diarrhea.  The vomiting is likely contributing to episodes of dehydration. She has compazine to take. Gastric emptying is normal. EGD in Oct 2023 unrevealing.  -Candidate for Lotronex?   Multiple myeloma. Has had problems with treatment toxicity.    Chronically low white blood cells, gets Neulasta sometimes. Followed by Dr. Myna Hidalgo.      History of Present Illness    Chief complaint: diarrhea   Tami [name] has previously been seen here for abdominal pain , nausea, vomiting and diarrhea. She has had an extensive GI workup including multiple CT scans, CT enterography , abdominal ultrasound , upper and lower endoscopies with biopsies, stool  studies, gastric emptying study.    Dellamae's most pressing Gi problems is that of chronic diarrhea. Felt to have IBS-D but also thought to have bile acid related diarrhea. Further complicating things, she has had recurring episodes of C-diff  since Nov 2022 and is followed by ID. Colonoscopy with colon biopsies in Oct 2023 negative for microscopic colitis or amyloid. Duodenal biopsies negative. Diarrhea leads to dehydration requiring IV fluids. She has multiple episodes of loose stool a day . Most recent C-diff study on 04/04/23 was negative but diarrhea persists despite cholestyramine, fiber, probiotic Align, bowel antispasmodics, and imodium.     Outpatient medications reviewed. She is not taking Bentyl though it is on home med list.so will remove it. Not take Colestipol ( taking Prevalite).so will remove from list.   PCP prescribed Levsin SL today, she will take that instead of Bentyl. Marland Kitchen      Previous GI Endoscopies / Labs / Imaging    Most recent EGD and colonoscopy October 2023 EGD -LA grade a reflux esophagitis.  Biopsied.  Normal stomach.  Biopsied.  Normal examined duodenum.  Biopsied. Colonoscopy  -Entire examined colon normal.  Biopsied.  Minimal sigmoid diverticulosis.  Examined portion of the ileum was normal.  Deep terminal ileal intubation was performed.  Biopsy.  Nonbleeding internal hemorrhoids.  Examination was otherwise normal..    Postprocedure she was given a trial of Bentyl.  Symptoms felt to be anxiety related   FINAL MICROSCOPIC DIAGNOSIS:  A. SMALL BOWEL, BIOPSY: - Benign small bowel mucosa with no significant pathologic changes  B. STOMACH, BIOPSY: -  Gastric antral and oxyntic mucosa with no specific pathologic changes - Negative for H. pylori on HE stain - Negative for intestinal metaplasia or malignancy  C. ESOPHAGUS, DISTAL, BIOPSY: - Benign squamous mucosa with reflux changes - Gastric cardiac mucosa with no specific pathologic changes - Negative for  intestinal metaplasia, dysplasia or malignancy  D. TERMINAL ILEUM, BIOPSY: - Benign small bowel mucosa with no significant pathologic changes  E. COLON, RANDOM, BIOPSY: - Benign colonic mucosa with no specific pathologic changes - Negative for increased intraepithelial lymphocytes or thickened subepithelial collagen table - Negative for dysplasia or malignancy          Latest Ref Rng & Units 04/22/2023    9:33 AM 04/14/2023    9:40 PM 04/04/2023   10:57 AM  Hepatic Function  Total Protein 6.5 - 8.1 g/dL 9.4  9.1  8.8   Albumin 3.5 - 5.0 g/dL 3.8  3.2  3.2   AST 15 - 41 U/L 29  27  36   ALT 0 - 44 U/L 16  17  21    Alk Phosphatase 38 - 126 U/L 47  52  58   Total Bilirubin 0.3 - 1.2 mg/dL 0.5  0.2  0.4           Latest Ref Rng & Units 04/22/2023    9:33 AM 04/14/2023    9:40 PM 04/04/2023   10:57 AM  CBC  WBC 4.0 - 10.5 K/uL 2.3  2.3  2.5   Hemoglobin 12.0 - 15.0 g/dL 40.9  81.1  91.4   Hematocrit 36.0 - 46.0 % 39.4  36.2  35.8   Platelets 150 - 400 K/uL 148  138  107             Past Medical History:  Diagnosis Date   Abnormal SPEP 07/26/2016   Arthritis      knees, hands   Back pain 07/14/2016   Bowel obstruction (HCC) 11/2014   C. 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   Iron deficiency anemia due to chronic blood loss 05/28/2022   Low back pain 07/14/2016   Monoclonal gammopathy of unknown significance (MGUS) 09/16/2016   Multiple myeloma (HCC) 07/20/2018   Multiple myeloma not having achieved remission (HCC) 07/20/2018   Myalgia 12/31/2016   Obstructive sleep apnea 03/31/2015   Presence of permanent cardiac pacemaker     SOB (shortness of breath)  04/09/2016   Stroke (HCC)      TIAs   UTI (urinary tract infection)             Past Surgical History:  Procedure Laterality Date   ABDOMINAL HYSTERECTOMY        menorraghia, 2006, total   BIOPSY   09/01/2022    Procedure: BIOPSY;  Surgeon: Lynann Bologna, MD;  Location: [facility];  Service: Gastroenterology;;   CHOLECYSTECTOMY       COLONOSCOPY   2018    [facility] per patient   COLONOSCOPY WITH PROPOFOL N/A 09/01/2022    Procedure: COLONOSCOPY WITH PROPOFOL;  Surgeon: Lynann Bologna, MD;  Location: [facility];  Service: Gastroenterology;  Laterality: N/A;   ESOPHAGOGASTRODUODENOSCOPY   2018    [facility]    ESOPHAGOGASTRODUODENOSCOPY (EGD) WITH PROPOFOL N/A 09/01/2022    Procedure: ESOPHAGOGASTRODUODENOSCOPY (EGD) WITH PROPOFOL;  Surgeon: Lynann Bologna, MD;  Location: [facility];  Service: Gastroenterology;  Laterality: N/A;   KNEE SURGERY        right, repair torn torn cartialage   PACEMAKER INSERTION   08/2014   TONSILLECTOMY       TUBAL LIGATION          Current Medications, Allergies, Family History and Social History were reviewed in [employer] record.           Current Outpatient Medications  Medication Sig Dispense Refill   acyclovir (ZOVIRAX) 400 MG tablet Take 1 tablet (400 mg total) by mouth 2 (two) times daily. 60 tablet 3   albuterol (PROVENTIL) (2.5 MG/3ML) 0.083% nebulizer solution Take 3 mLs (2.5 mg total) by nebulization every 6 (six) hours as needed for wheezing or shortness of breath. 150 mL 2   albuterol (VENTOLIN HFA) 108 (90 Base) MCG/ACT inhaler Inhale 2 puffs into the lungs every 6 (six) hours as needed for wheezing or shortness of breath. 18 g 5   amLODipine (NORVASC) 5 MG tablet Take 1 tablet (5 mg total) by mouth daily. 30 tablet 4   aspirin EC 81 MG tablet Take 81 mg by mouth daily. Swallow whole.       budesonide (PULMICORT) 0.5 MG/2ML nebulizer solution Take 2 mLs (0.5 mg total) by nebulization  2 (two) times daily as needed (cough, wheezing or shortness of breath). 120 mL 11   cetirizine (ZYRTEC) 10 MG tablet Take 10 mg by mouth at bedtime.       Cholecalciferol (VITAMIN D3) 50 MCG (2000 UT) TABS Take 2,000 Units by mouth in the morning.       cholestyramine light (PREVALITE) 4 g packet Take 4 g by mouth 2 (two) times daily.       diphenoxylate-atropine (LOMOTIL) 2.5-0.025 MG tablet Take 1 tablet by mouth 3 (three) times daily. As needed  for diarrhea 60 tablet 3   fluticasone (FLONASE) 50 MCG/ACT nasal spray Place 2 sprays into both nostrils daily. 16 g 0   furosemide (LASIX) 20 MG tablet Take 20 mg by mouth daily as needed (fluid retention (feet swelling)).       hydrOXYzine (ATARAX) 10 MG tablet TAKE ONE (1) TABLET BY MOUTH 3 TIMES DAILY AS NEEDED FOR ITCHING 30 tablet 2   hyoscyamine (LEVSIN SL) 0.125 MG SL tablet Place 1 tablet (0.125 mg total) under the tongue every 4 (four) hours as needed. 30 tablet 1   loperamide (IMODIUM) 2 MG capsule Take 4 mg by mouth in the morning and at bedtime.       losartan (COZAAR) 50 MG tablet Take 50 mg by mouth in the morning and at bedtime.       Magnesium Glycinate 100 MG CAPS Take 200 mg by mouth at bedtime.       meclizine (ANTIVERT) 12.5 MG tablet Take 12.5 mg by mouth 2 (two) times daily as needed for dizziness or nausea.       meloxicam (MOBIC) 15 MG tablet Take 15 mg by mouth daily.       metoprolol succinate (TOPROL-XL) 50 MG 24 hr tablet Take 1 tablet (50 mg total) by mouth 2 (two) times daily. (Patient taking differently: Take 50 mg by mouth daily.) 60 tablet 2   Multiple Vitamin (QUINTABS) TABS Take 1  tablet by mouth daily.       nortriptyline (PAMELOR) 10 MG capsule Take 1 capsule (10 mg total) by mouth at bedtime. (Patient taking differently: Take 10 mg by mouth at bedtime. As needed) 30 capsule 1   oxyCODONE-acetaminophen (PERCOCET/ROXICET) 5-325 MG tablet Take 1 tablet by mouth every 6 (six) hours as needed for severe pain.        potassium chloride SA (KLOR-CON M) 20 MEQ tablet Take 1 tablet (20 mEq total) by mouth daily. 90 tablet 1   Probiotic Product (ALIGN) 4 MG CAPS Take 1 capsule (4 mg total) by mouth daily. 30 capsule 5   prochlorperazine (COMPAZINE) 10 MG tablet Take 0.5 tablets (5 mg total) by mouth 2 (two) times daily as needed for nausea or vomiting. 60 tablet 1   spironolactone (ALDACTONE) 25 MG tablet Take 12.5 mg by mouth daily.       dicyclomine (BENTYL) 10 MG capsule Take 1 capsule (10 mg total) by mouth 4 (four) times daily -  before meals and at bedtime. (Patient taking differently: Take 10 mg by mouth 4 (four) times daily -  before meals and at bedtime. cramping) 120 capsule 2             Current Facility-Administered Medications  Medication Dose Route Frequency Provider Last Rate Last Admin   nitroGLYCERIN (NITROSTAT) SL tablet 0.4 mg  0.4 mg Sublingual Once [name], MD          Review of Systems: No chest pain. No shortness of breath. No urinary complaints.      Physical Exam      Wt Readings from Last 3 Encounters:  04/27/23 190 lb 3.2 oz (86.3 kg)  04/27/23 190 lb (86.2 kg)  04/22/23 188 lb 1.9 oz (85.3 kg)      BP 120/82   Pulse 100   Ht 5\' 6"  (1.676 m)   Wt 190 lb 3.2 oz (86.3 kg)   BMI 30.70 kg/m  Constitutional:  Pleasant, generally well appearing female in no acute distress. Psychiatric: Normal mood and affect. Behavior is normal. EENT: Pupils normal.  Conjunctivae are normal. No scleral icterus. Neck supple.  Cardiovascular: Normal rate, regular rhythm.  Pulmonary/chest: Effort normal and breath sounds normal. No wheezing, rales or rhonchi. Abdominal: Soft, nondistended, nontender. Bowel sounds active throughout. There are no masses palpable. No hepatomegaly. Neurological: Alert and oriented to person place and time.  Skin: Skin is warm and dry. No rashes noted.   Willette Cluster, NP          Attending physician's note   I have taken history, reviewed the  chart and examined the patient. I performed a substantive portion of this encounter, including complete performance of at least one of the key components, in conjunction with the APP. I agree with the Advanced Practitioner's note, impression and recommendations.   Feels better. No further diarrhea with imodium. Lomotil 1 tab bid Feels better  Has appt with Dr Myna Hidalgo    Plan: -Continue lomotil 1 tab po BID #60, 6RF -Call if any problems.   Edman Circle, MD [facility] [phone]

## 2023-05-20 ENCOUNTER — Telehealth: Payer: Self-pay | Admitting: *Deleted

## 2023-05-20 DIAGNOSIS — I5042 Chronic combined systolic (congestive) and diastolic (congestive) heart failure: Secondary | ICD-10-CM | POA: Diagnosis not present

## 2023-05-20 NOTE — Progress Notes (Signed)
  Care Coordination Note  05/20/2023 Name: Tami [name] MRN: [id number] DOB: [birth year]  Tami [name] is a 67 y.o. year old female who is a primary care patient of [name], MD and is actively engaged with the care management team. I reached out to [name] by phone today to assist with re-scheduling a follow up visit with the RN Case Manager  Follow up plan: Unsuccessful telephone outreach attempt made. A HIPAA compliant phone message was left for the patient providing contact information and requesting a return call.   Burman Nieves, CCMA Care Coordination Care Guide Direct Dial: [phone]

## 2023-05-21 ENCOUNTER — Inpatient Hospital Stay (HOSPITAL_BASED_OUTPATIENT_CLINIC_OR_DEPARTMENT_OTHER): Payer: 59 | Admitting: Hematology & Oncology

## 2023-05-21 ENCOUNTER — Inpatient Hospital Stay: Payer: 59

## 2023-05-21 ENCOUNTER — Encounter: Payer: Self-pay | Admitting: Hematology & Oncology

## 2023-05-21 ENCOUNTER — Inpatient Hospital Stay: Payer: 59 | Attending: Hematology & Oncology

## 2023-05-21 VITALS — BP 153/95 | HR 88 | Temp 98.0°F | Resp 20 | Ht 66.0 in | Wt 191.8 lb

## 2023-05-21 DIAGNOSIS — D801 Nonfamilial hypogammaglobulinemia: Secondary | ICD-10-CM

## 2023-05-21 DIAGNOSIS — Z5189 Encounter for other specified aftercare: Secondary | ICD-10-CM | POA: Insufficient documentation

## 2023-05-21 DIAGNOSIS — C9000 Multiple myeloma not having achieved remission: Secondary | ICD-10-CM | POA: Insufficient documentation

## 2023-05-21 DIAGNOSIS — R197 Diarrhea, unspecified: Secondary | ICD-10-CM | POA: Diagnosis not present

## 2023-05-21 DIAGNOSIS — D500 Iron deficiency anemia secondary to blood loss (chronic): Secondary | ICD-10-CM

## 2023-05-21 DIAGNOSIS — E860 Dehydration: Secondary | ICD-10-CM

## 2023-05-21 LAB — CBC WITH DIFFERENTIAL (CANCER CENTER ONLY)
Abs Immature Granulocytes: 0.01 10*3/uL (ref 0.00–0.07)
Basophils Absolute: 0 10*3/uL (ref 0.0–0.1)
Basophils Relative: 1 %
Eosinophils Absolute: 0.1 10*3/uL (ref 0.0–0.5)
Eosinophils Relative: 3 %
HCT: 38.5 % (ref 36.0–46.0)
Hemoglobin: 12.2 g/dL (ref 12.0–15.0)
Immature Granulocytes: 1 %
Lymphocytes Relative: 48 %
Lymphs Abs: 0.9 10*3/uL (ref 0.7–4.0)
MCH: 28.8 pg (ref 26.0–34.0)
MCHC: 31.7 g/dL (ref 30.0–36.0)
MCV: 91 fL (ref 80.0–100.0)
Monocytes Absolute: 0.2 10*3/uL (ref 0.1–1.0)
Monocytes Relative: 9 %
Neutro Abs: 0.7 10*3/uL — ABNORMAL LOW (ref 1.7–7.7)
Neutrophils Relative %: 38 %
Platelet Count: 130 10*3/uL — ABNORMAL LOW (ref 150–400)
RBC: 4.23 MIL/uL (ref 3.87–5.11)
RDW: 13.6 % (ref 11.5–15.5)
WBC Count: 1.9 10*3/uL — ABNORMAL LOW (ref 4.0–10.5)
nRBC: 0 % (ref 0.0–0.2)

## 2023-05-21 LAB — CMP (CANCER CENTER ONLY)
ALT: 15 U/L (ref 0–44)
AST: 30 U/L (ref 15–41)
Albumin: 3.8 g/dL (ref 3.5–5.0)
Alkaline Phosphatase: 45 U/L (ref 38–126)
Anion gap: 7 (ref 5–15)
BUN: 15 mg/dL (ref 8–23)
CO2: 27 mmol/L (ref 22–32)
Calcium: 9.9 mg/dL (ref 8.9–10.3)
Chloride: 105 mmol/L (ref 98–111)
Creatinine: 0.67 mg/dL (ref 0.44–1.00)
GFR, Estimated: >60 mL/min (ref >60)
Glucose, Bld: 117 mg/dL — ABNORMAL HIGH (ref 70–99)
Potassium: 4 mmol/L (ref 3.5–5.1)
Sodium: 139 mmol/L (ref 135–145)
Total Bilirubin: 0.5 mg/dL (ref 0.3–1.2)
Total Protein: 9.5 g/dL — ABNORMAL HIGH (ref 6.5–8.1)

## 2023-05-21 LAB — LACTATE DEHYDROGENASE: LDH: 188 U/L (ref 98–192)

## 2023-05-21 MED ORDER — NEULASTA 6 MG/0.6ML ~~LOC~~ SOSY: 6.0000 mg | PREFILLED_SYRINGE | Freq: Once | SUBCUTANEOUS | 0 refills | Status: AC

## 2023-05-21 MED ORDER — PEGFILGRASTIM-CBQV 6 MG/0.6ML ~~LOC~~ SOSY
6.0000 mg | PREFILLED_SYRINGE | Freq: Once | SUBCUTANEOUS | Status: AC
  Administered 2023-05-21: 6 mg via SUBCUTANEOUS
  Filled 2023-05-21: qty 0.6

## 2023-05-21 NOTE — Patient Instructions (Signed)

## 2023-05-21 NOTE — Progress Notes (Signed)
Hematology and Oncology Follow Up Visit  Tami [name] [id number] 08/22/1956 67 y.o. 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  She says she is feeling a lot better.  She says she has not felt this well in quite a while.  Her diarrhea is much better now.  She has not required IV fluids for about 3 weeks.  The problem, however, is at her myeloma levels keep going up.  When we last saw her, her M spike was up to 2.4 g/dL.  Her IgG level is 4077 mg/dL.  Her Kappa light chain is 9 mg/dL.  I really think that the trend clearly is upward for her myeloma.  At some point, we will going to have to get her on treatment.  We have not had her on treatment now for over 2 years.  She is done incredibly well off treatment.  I realize he has had problems with therapy.  I would like to hope that we could figure out a way of trying to treat her.  She does not complain of any pain.  I would like to get a PET scan on her.  She has had no cough or shortness of breath.  Her white cell count is dropping.  I suspect this might also be from her myeloma.  We will go ahead and give her Neulasta.  At the present time, I would say that her performance status is probably ECOG 1.    Medications:  Allergies as of 05/21/2023       Reactions   Benazepril Anaphylaxis, Swelling, Hives   angioedema Throat and lip swelling   Ondansetron Hcl Hives   Redness and hives post IV admin on 07/05/17   Codeine Nausea And Vomiting   Morphine Hives   Redness and hives noted post IV admin on 07/05/17         Medication List        Accurate as of May 21, 2023  1:03 PM. If you have any questions, ask your nurse or doctor.          acyclovir 400 MG tablet Commonly known as: ZOVIRAX Take 1 tablet (400 mg total) by mouth 2 (two) times daily.   albuterol 108 (90 Base) MCG/ACT inhaler Commonly known as: VENTOLIN HFA Inhale 2 puffs into the lungs every 6 (six) hours as needed for wheezing or shortness of breath.   albuterol (2.5 MG/3ML) 0.083% nebulizer solution Commonly known as: PROVENTIL Take 3 mLs (2.5 mg total) by nebulization every 6 (six) hours as needed for wheezing or shortness of breath.   Align 4 MG Caps Take 1 capsule (4 mg total) by mouth daily.   amLODipine 5 MG tablet Commonly known as: NORVASC Take 1 tablet (5 mg total) by mouth daily.   aspirin EC 81 MG tablet Take 81 mg by mouth daily. Swallow whole.   budesonide 0.5 MG/2ML nebulizer solution Commonly known as: Pulmicort Take 2 mLs (0.5 mg total) by nebulization 2 (two) times daily as needed (cough, wheezing or shortness of breath).  cetirizine 10 MG tablet Commonly known as: ZYRTEC Take 10 mg by mouth at bedtime.   cholestyramine light 4 g packet Commonly known as: PREVALITE Take 4 g by mouth 2 (two) times daily.   dicyclomine 10 MG capsule Commonly known as: BENTYL Take 1 capsule (10 mg total) by mouth 4 (four) times daily -  before meals and at bedtime. What changed: additional instructions   diphenoxylate-atropine 2.5-0.025 MG tablet Commonly known as: Lomotil Take 1 tablet by mouth 3 (three) times daily. As needed  for diarrhea   diphenoxylate-atropine 2.5-0.025 MG tablet Commonly known as: Lomotil Take 1 tablet by mouth 2 (two) times daily.   fluticasone 50 MCG/ACT nasal spray Commonly known as: FLONASE Place 2 sprays into both nostrils daily.   furosemide 20 MG tablet Commonly known as: LASIX Take 20 mg by mouth daily as needed (fluid retention (feet swelling)).   hydrOXYzine 10 MG  tablet Commonly known as: ATARAX TAKE ONE (1) TABLET BY MOUTH 3 TIMES DAILY AS NEEDED FOR ITCHING   hyoscyamine 0.125 MG SL tablet Commonly known as: LEVSIN SL Place 1 tablet (0.125 mg total) under the tongue every 4 (four) hours as needed.   loperamide 2 MG capsule Commonly known as: IMODIUM Take 4 mg by mouth in the morning and at bedtime.   losartan 50 MG tablet Commonly known as: COZAAR Take 50 mg by mouth in the morning and at bedtime.   Magnesium Glycinate 100 MG Caps Take 200 mg by mouth at bedtime.   meclizine 12.5 MG tablet Commonly known as: ANTIVERT Take 12.5 mg by mouth 2 (two) times daily as needed for dizziness or nausea.   meloxicam 15 MG tablet Commonly known as: MOBIC Take 15 mg by mouth daily.   metoprolol succinate 50 MG 24 hr tablet Commonly known as: TOPROL-XL Take 1 tablet (50 mg total) by mouth 2 (two) times daily. What changed: when to take this   nortriptyline 10 MG capsule Commonly known as: Pamelor Take 1 capsule (10 mg total) by mouth at bedtime. What changed: additional instructions   oxyCODONE-acetaminophen 5-325 MG tablet Commonly known as: PERCOCET/ROXICET Take 1 tablet by mouth every 6 (six) hours as needed for severe pain.   potassium chloride SA 20 MEQ tablet Commonly known as: KLOR-CON M Take 1 tablet (20 mEq total) by mouth daily.   prochlorperazine 10 MG tablet Commonly known as: COMPAZINE Take 0.5 tablets (5 mg total) by mouth 2 (two) times daily as needed for nausea or vomiting.   Quintabs Tabs Take 1 tablet by mouth daily.   spironolactone 25 MG tablet Commonly known as: ALDACTONE Take 12.5 mg by mouth daily.   Vitamin D3 50 MCG (2000 UT) Tabs Take 2,000 Units by mouth in the morning.        Allergies:  Allergies  Allergen Reactions   Benazepril Anaphylaxis, Swelling and Hives    angioedema Throat and lip swelling   Ondansetron Hcl Hives    Redness and hives post IV admin on 07/05/17   Codeine Nausea And  Vomiting   Morphine Hives    Redness and hives noted post IV admin on 07/05/17    Past Medical History, Surgical history, Social history, and Family History were reviewed and updated.  Review of Systems: Review of Systems  Constitutional: Negative.   HENT: Negative.    Eyes: Negative.   Respiratory: Negative.    Cardiovascular: Negative.   Gastrointestinal: Negative.   Genitourinary: Negative.   Musculoskeletal: Negative.   Skin: Negative.  Neurological: Negative.   Endo/Heme/Allergies: Negative.   Psychiatric/Behavioral: Negative.        Physical Exam: Temperature 98.  Pulse 88.  Blood pressure 153/95.  Weight is 191 pounds.  Wt Readings from Last 3 Encounters:  05/19/23 193 lb (87.5 kg)  05/03/23 191 lb 6.4 oz (86.8 kg)  04/27/23 190 lb 3.2 oz (86.3 kg)    Physical Exam Vitals reviewed.  HENT:     Head: Normocephalic and atraumatic.  Eyes:     Pupils: Pupils are equal, round, and reactive to light.     Comments: Mild pallor of conjunctiva  Cardiovascular:     Rate and Rhythm: Normal rate and regular rhythm.     Heart sounds: Normal heart sounds.  Pulmonary:     Effort: Pulmonary effort is normal.     Breath sounds: Normal breath sounds.  Abdominal:     General: Bowel sounds are normal.     Palpations: Abdomen is soft.  Musculoskeletal:        General: No tenderness or deformity. Normal range of motion.     Cervical back: Normal range of motion.  Lymphadenopathy:     Cervical: No cervical adenopathy.  Skin:    General: Skin is warm and dry.     Findings: No erythema or rash.  Neurological:     Mental Status: She is alert and oriented to person, place, and time.  Psychiatric:        Behavior: Behavior normal.        Thought Content: Thought content normal.        Judgment: Judgment normal.     Lab Results  Component Value Date   WBC 1.9 (L) 05/21/2023   HGB 12.2 05/21/2023   HCT 38.5 05/21/2023   MCV 91.0 05/21/2023   PLT 130 (L) 05/21/2023    Lab Results  Component Value Date   FERRITIN 402 (H) 02/22/2023   IRON 66 02/22/2023   TIBC 346 02/22/2023   UIBC 280 02/22/2023   IRONPCTSAT 19 02/22/2023   Lab Results  Component Value Date   RETICCTPCT 1.2 11/27/2022   RBC 4.23 05/21/2023   RETICCTABS 32,640 07/16/2016   Lab Results  Component Value Date   KPAFRELGTCHN 89.7 (H) 04/22/2023   LAMBDASER 6.0 04/22/2023   KAPLAMBRATIO 14.95 (H) 04/22/2023   Lab Results  Component Value Date   IGGSERUM 4,077 (H) 04/22/2023   IGA 218 04/22/2023   IGMSERUM 152 04/22/2023   Lab Results  Component Value Date   TOTALPROTELP 8.6 (H) 04/22/2023   ALBUMINELP 3.3 04/22/2023   A1GS 0.2 04/22/2023   A2GS 1.0 04/22/2023   BETS 1.1 04/22/2023   BETA2SER 0.5 07/16/2016   GAMS 3.0 (H) 04/22/2023   MSPIKE 2.4 (H) 04/22/2023   SPEI Comment 10/27/2022     Chemistry      Component Value Date/Time   NA 139 04/22/2023 0933   NA 143 11/08/2017 1127   NA 140 12/31/2016 1000   K 4.3 04/22/2023 0933   K 4.0 11/08/2017 1127   K 3.6 12/31/2016 1000   CL 104 04/22/2023 0933   CL 107 11/08/2017 1127   CO2 26 04/22/2023 0933   CO2 25 11/08/2017 1127   CO2 24 12/31/2016 1000   BUN 20 04/22/2023 0933   BUN 19 11/08/2017 1127   BUN 22.3 12/31/2016 1000   CREATININE 0.81 04/22/2023 0933   CREATININE 0.9 11/08/2017 1127   CREATININE 0.8 12/31/2016 1000      Component Value Date/Time  CALCIUM 10.1 04/22/2023 0933   CALCIUM 9.9 11/08/2017 1127   CALCIUM 9.5 12/31/2016 1000   ALKPHOS 47 04/22/2023 0933   ALKPHOS 44 11/08/2017 1127   ALKPHOS 51 12/31/2016 1000   AST 29 04/22/2023 0933   AST 28 12/31/2016 1000   ALT 16 04/22/2023 0933   ALT 36 11/08/2017 1127   ALT 16 12/31/2016 1000   BILITOT 0.5 04/22/2023 0933   BILITOT 0.46 12/31/2016 1000       Impression and Plan: Ms. Winegar is a very pleasant 67 yo African American female with a previous IgG kappa MGUS with progression to myeloma.   Again, I suspect that we can have to  get her on treatment for the myeloma.  Her total protein is also up.  I suspect that her myeloma levels will also be higher.  Again she has not had a bone marrow biopsy in quite a while.  We really need to have 1 done so we see what is in the bone marrow and look at the [facility] panel in the cytogenetics.  We will see about a PET scan.  I do not think she is ever had a PET scan before.  We will see if any of her bones light up.  I know that there is a big cruise that she is going on in September.  I am unsure if she will want treatment to start before September.  That is still 2 months away.  I would like to hope that we can wait that far.  Again, the bone marrow test will show [place].   I know this is somewhat upsetting for her.  I know she is feeling quite well.  I know quality of life is very important for her.  Thankfully, the diarrhea is better.  Her heart is also doing well.  She has a pacemaker in.  I would like to have the PET scan and bone marrow test done in about 3 weeks.  I will then see her back afterwards and then we can finally come up with a recommendation for treatment.    Josph Macho, MD 7/12/20241:03 PM

## 2023-05-23 ENCOUNTER — Emergency Department (HOSPITAL_BASED_OUTPATIENT_CLINIC_OR_DEPARTMENT_OTHER)
Admission: EM | Admit: 2023-05-23 | Discharge: 2023-05-23 | Disposition: A | Discharge status: Home / Self Care | Payer: 59 | Attending: Emergency Medicine | Admitting: Emergency Medicine

## 2023-05-23 ENCOUNTER — Other Ambulatory Visit: Payer: Self-pay

## 2023-05-23 ENCOUNTER — Encounter (HOSPITAL_BASED_OUTPATIENT_CLINIC_OR_DEPARTMENT_OTHER): Payer: Self-pay

## 2023-05-23 DIAGNOSIS — E860 Dehydration: Secondary | ICD-10-CM | POA: Diagnosis not present

## 2023-05-23 DIAGNOSIS — Z950 Presence of cardiac pacemaker: Secondary | ICD-10-CM | POA: Insufficient documentation

## 2023-05-23 DIAGNOSIS — I110 Hypertensive heart disease with heart failure: Secondary | ICD-10-CM | POA: Insufficient documentation

## 2023-05-23 DIAGNOSIS — I509 Heart failure, unspecified: Secondary | ICD-10-CM | POA: Insufficient documentation

## 2023-05-23 DIAGNOSIS — R519 Headache, unspecified: Secondary | ICD-10-CM | POA: Diagnosis not present

## 2023-05-23 DIAGNOSIS — Z20822 Contact with and (suspected) exposure to covid-19: Secondary | ICD-10-CM | POA: Insufficient documentation

## 2023-05-23 DIAGNOSIS — R197 Diarrhea, unspecified: Secondary | ICD-10-CM | POA: Insufficient documentation

## 2023-05-23 LAB — URINALYSIS, MICROSCOPIC (REFLEX)

## 2023-05-23 LAB — CBC WITH DIFFERENTIAL/PLATELET
Abs Immature Granulocytes: 0.13 10*3/uL — ABNORMAL HIGH (ref 0.00–0.07)
Basophils Absolute: 0 10*3/uL (ref 0.0–0.1)
Basophils Relative: 0 %
Eosinophils Absolute: 0.1 10*3/uL (ref 0.0–0.5)
Eosinophils Relative: 1 %
HCT: 35.2 % — ABNORMAL LOW (ref 36.0–46.0)
Hemoglobin: 11.5 g/dL — ABNORMAL LOW (ref 12.0–15.0)
Immature Granulocytes: 1 %
Lymphocytes Relative: 10 %
Lymphs Abs: 1.6 10*3/uL (ref 0.7–4.0)
MCH: 28.8 pg (ref 26.0–34.0)
MCHC: 32.7 g/dL (ref 30.0–36.0)
MCV: 88.2 fL (ref 80.0–100.0)
Monocytes Absolute: 1.1 10*3/uL — ABNORMAL HIGH (ref 0.1–1.0)
Monocytes Relative: 7 %
Neutro Abs: 12.4 10*3/uL — ABNORMAL HIGH (ref 1.7–7.7)
Neutrophils Relative %: 81 %
Platelets: 105 10*3/uL — ABNORMAL LOW (ref 150–400)
RBC: 3.99 MIL/uL (ref 3.87–5.11)
RDW: 13.9 % (ref 11.5–15.5)
Smear Review: NORMAL
WBC: 15.3 10*3/uL — ABNORMAL HIGH (ref 4.0–10.5)
nRBC: 0 % (ref 0.0–0.2)

## 2023-05-23 LAB — BASIC METABOLIC PANEL
Anion gap: 10 (ref 5–15)
BUN: 15 mg/dL (ref 8–23)
CO2: 21 mmol/L — ABNORMAL LOW (ref 22–32)
Calcium: 9.1 mg/dL (ref 8.9–10.3)
Chloride: 106 mmol/L (ref 98–111)
Creatinine, Ser: 0.7 mg/dL (ref 0.44–1.00)
GFR, Estimated: >60 mL/min (ref >60)
Glucose, Bld: 113 mg/dL — ABNORMAL HIGH (ref 70–99)
Potassium: 3.4 mmol/L — ABNORMAL LOW (ref 3.5–5.1)
Sodium: 137 mmol/L (ref 135–145)

## 2023-05-23 LAB — IGG, IGA, IGM
IgA: 172 mg/dL (ref 87–352)
IgG (Immunoglobin G), Serum: 3678 mg/dL — ABNORMAL HIGH (ref 586–1602)
IgM (Immunoglobulin M), Srm: 127 mg/dL (ref 26–217)

## 2023-05-23 LAB — URINALYSIS, ROUTINE W REFLEX MICROSCOPIC
Bilirubin Urine: NEGATIVE
Glucose, UA: NEGATIVE mg/dL
Ketones, ur: NEGATIVE mg/dL
Nitrite: NEGATIVE
Protein, ur: NEGATIVE mg/dL
Specific Gravity, Urine: 1.02 (ref 1.005–1.030)
pH: 5.5 (ref 5.0–8.0)

## 2023-05-23 LAB — MAGNESIUM: Magnesium: 1.5 mg/dL — ABNORMAL LOW (ref 1.7–2.4)

## 2023-05-23 LAB — SARS CORONAVIRUS 2 BY RT PCR: SARS Coronavirus 2 by RT PCR: NEGATIVE

## 2023-05-23 LAB — TROPONIN I (HIGH SENSITIVITY): Troponin I (High Sensitivity): 15 ng/L (ref <18)

## 2023-05-23 MED ORDER — SODIUM CHLORIDE 0.9 % IV BOLUS
500.0000 mL | Freq: Once | INTRAVENOUS | Status: AC
  Administered 2023-05-23: 500 mL via INTRAVENOUS

## 2023-05-23 MED ORDER — KETOROLAC TROMETHAMINE 15 MG/ML IJ SOLN
15.0000 mg | Freq: Once | INTRAMUSCULAR | Status: AC
  Administered 2023-05-23: 15 mg via INTRAVENOUS
  Filled 2023-05-23: qty 1

## 2023-05-23 MED ORDER — PROCHLORPERAZINE EDISYLATE 10 MG/2ML IJ SOLN
10.0000 mg | Freq: Once | INTRAMUSCULAR | Status: AC
  Administered 2023-05-23: 10 mg via INTRAVENOUS
  Filled 2023-05-23: qty 2

## 2023-05-23 MED ORDER — MAGNESIUM SULFATE IN D5W 1-5 GM/100ML-% IV SOLN
1.0000 g | Freq: Once | INTRAVENOUS | Status: AC
  Administered 2023-05-23: 1 g via INTRAVENOUS
  Filled 2023-05-23: qty 100

## 2023-05-23 NOTE — ED Provider Notes (Signed)
Emergency Department Provider Note   I have reviewed the triage vital signs and the nursing notes.   HISTORY  Chief Complaint Headache and Diarrhea   HPI Tami [name] is a 67 y.o. female past history reviewed below including myeloma followed with Dr. Myna Hidalgo presents to the emergency department with headache, dehydration, diarrhea.  Patient's symptoms have been waxing and waning for the past 2 years.  She often will requires ED evaluation for IV fluids.  She occasionally will see her oncologist as an outpatient for similar treatment.  She states her diarrhea has been under control past several months until this past week when symptoms returned.  No bladder fever.  No abdominal pain.  She has bodyaches and cramping chest throbbing headache.    Past Medical History:  Diagnosis Date   Abnormal SPEP 07/26/2016   Arthritis    knees, hands   Back pain 07/14/2016   Bowel obstruction (HCC) 11/2014   C. 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 Respiratory: Denies shortness of breath. Gastrointestinal: No abdominal pain. Positive nausea,  no vomiting.  Positive diarrhea.  No constipation. Genitourinary: Negative for dysuria. Musculoskeletal: Positive for back pain. Skin: Negative for rash. Neurological: Negative for focal weakness or numbness. Positive HA.   ____________________________________________   PHYSICAL EXAM:  VITAL SIGNS: ED Triage Vitals  Encounter Vitals Group     BP 05/23/23 1957 (!) 164/92     Pulse Rate 05/23/23 1957 (!) 106     Resp 05/23/23 1957 16     Temp 05/23/23 1957 98.6 F (37 C)     Temp Source 05/23/23 1957 Oral     SpO2 05/23/23 1957 95 %     Weight 05/23/23 1956 191 lb (86.6 kg)   Constitutional: Alert and oriented. Well appearing and in no acute distress. Eyes: Conjunctivae are normal. Head: Atraumatic. Nose: No congestion/rhinnorhea. Mouth/Throat: Mucous membranes are slightly dry.  Neck: No stridor.  Cardiovascular: Normal rate, regular rhythm. Good peripheral circulation. Grossly normal heart sounds.   Respiratory: Normal respiratory effort.  No retractions. Lungs CTAB. Gastrointestinal: Soft and nontender. No distention.  Musculoskeletal: No gross deformities of extremities. Neurologic:  Normal speech and language.  Skin:  Skin is warm, dry and intact. No rash noted.  ____________________________________________   LABS (all labs ordered are listed, but only abnormal results are displayed)  Labs Reviewed  CBC WITH DIFFERENTIAL/PLATELET - Abnormal; Notable for the following components:      Result Value   WBC 15.3 (*)    Hemoglobin 11.5 (*)    HCT 35.2 (*)    Platelets 105 (*)  Neutro Abs 12.4 (*)    Monocytes Absolute 1.1 (*)    Abs Immature Granulocytes 0.13 (*)    All other components within normal limits  BASIC METABOLIC PANEL - Abnormal; Notable for the following components:   Potassium 3.4 (*)    CO2 21 (*)    Glucose, Bld 113 (*)    All other components within normal limits  URINALYSIS, ROUTINE W REFLEX MICROSCOPIC - Abnormal; Notable for the following  components:   Hgb urine dipstick TRACE (*)    Leukocytes,Ua TRACE (*)    All other components within normal limits  MAGNESIUM - Abnormal; Notable for the following components:   Magnesium 1.5 (*)    All other components within normal limits  URINALYSIS, MICROSCOPIC (REFLEX) - Abnormal; Notable for the following components:   Bacteria, UA FEW (*)    All other components within normal limits  SARS CORONAVIRUS 2 BY RT PCR  TROPONIN I (HIGH SENSITIVITY)   ____________________________________________  EKG   EKG Interpretation Date/Time:  Sunday May 23 2023 21:55:48 EDT Ventricular Rate:  97 PR Interval:  146 QRS Duration:  135 QT Interval:  417 QTC Calculation: 530 R Axis:   -70  Text Interpretation: Sinus rhythm Probable left atrial enlargement Nonspecific IVCD with LAD Inferolateral infarct, old Similar to prior Confirmed by Alona Bene [phone]) on 05/23/2023 10:16:13 PM        ____________________________________________   PROCEDURES  Procedure(s) performed:   Procedures  None ____________________________________________   INITIAL IMPRESSION / ASSESSMENT AND PLAN / ED COURSE  Pertinent labs & imaging results that were available during my care of the patient were reviewed by me and considered in my medical decision making (see chart for details).   This patient is Presenting for Evaluation of HA, which does require a range of treatment options, and is a complaint that involves a high risk of morbidity and mortality.  The Differential Diagnoses includes but is not exclusive to subarachnoid hemorrhage, meningitis, encephalitis, previous head trauma, cavernous venous thrombosis, muscle tension headache, glaucoma, temporal arteritis, migraine or migraine equivalent, etc.   Critical Interventions-    Medications  sodium chloride 0.9 % bolus 500 mL (0 mLs Intravenous Stopped 05/23/23 2320)  prochlorperazine (COMPAZINE) injection 10 mg (10 mg Intravenous Given 05/23/23  2212)  magnesium sulfate IVPB 1 g 100 mL (0 g Intravenous Stopped 05/23/23 2253)  ketorolac (TORADOL) 15 MG/ML injection 15 mg (15 mg Intravenous Given 05/23/23 2327)    Reassessment after intervention: symptoms improved.    I did obtain Additional Historical Information from family at bedside.   I decided to review pertinent External Data, and in summary patient with similar visits to the ED in the past.   Clinical Laboratory Tests Ordered, included no UTI. No AKI. COVID negative.   Radiologic Tests: Considered neuro imaging but HA is similar to prior and no focal deficits.   Cardiac Monitor Tracing which shows NSR.    Social Determinants of Health Risk no smoking history.    Medical Decision Making: Summary:  Patient presents to the ED with symptoms of dehydration and HA. Plan for IVF and reassess.   Reevaluation with update and discussion with patient. She is feeling improved after IVF, Mg, and Toradol. Labs are reassuring. Stable for discharge.   Considered admission but labs are reassuring. No acute findings. Patient stable for discharge.   Patient's presentation is most consistent with acute presentation with potential threat to life or bodily function.   Disposition: discharge  ____________________________________________  FINAL CLINICAL  IMPRESSION(S) / ED DIAGNOSES  Final diagnoses:  Diarrhea, unspecified type  Dehydration  Bad headache    Note:  This document was prepared using Dragon voice recognition software and may include unintentional dictation errors.  Alona Bene, MD, [facility] Emergency Medicine    Nandita Mathenia, Arlyss Repress, MD 05/26/23 1057

## 2023-05-23 NOTE — Discharge Instructions (Signed)
Please continue to follow with your primary care and oncology team. Return to the ED with any new or worsening symptoms.

## 2023-05-23 NOTE — ED Triage Notes (Signed)
Pt arrives with c/o headache and diarrhea that started about a week ago. Pt denies n/v.

## 2023-05-24 DIAGNOSIS — E782 Mixed hyperlipidemia: Secondary | ICD-10-CM | POA: Diagnosis not present

## 2023-05-24 DIAGNOSIS — Z95810 Presence of automatic (implantable) cardiac defibrillator: Secondary | ICD-10-CM | POA: Diagnosis not present

## 2023-05-24 DIAGNOSIS — K529 Noninfective gastroenteritis and colitis, unspecified: Secondary | ICD-10-CM | POA: Diagnosis not present

## 2023-05-24 DIAGNOSIS — I110 Hypertensive heart disease with heart failure: Secondary | ICD-10-CM | POA: Diagnosis not present

## 2023-05-24 DIAGNOSIS — I5042 Chronic combined systolic (congestive) and diastolic (congestive) heart failure: Secondary | ICD-10-CM | POA: Diagnosis not present

## 2023-05-24 DIAGNOSIS — Z8679 Personal history of other diseases of the circulatory system: Secondary | ICD-10-CM | POA: Diagnosis not present

## 2023-05-24 DIAGNOSIS — C9000 Multiple myeloma not having achieved remission: Secondary | ICD-10-CM | POA: Diagnosis not present

## 2023-05-24 DIAGNOSIS — I480 Paroxysmal atrial fibrillation: Secondary | ICD-10-CM | POA: Diagnosis not present

## 2023-05-24 LAB — KAPPA/LAMBDA LIGHT CHAINS
Kappa free light chain: 82.6 mg/L — ABNORMAL HIGH (ref 3.3–19.4)
Kappa, lambda light chain ratio: 11.32 — ABNORMAL HIGH (ref 0.26–1.65)
Lambda free light chains: 7.3 mg/L (ref 5.7–26.3)

## 2023-05-24 NOTE — Progress Notes (Deleted)
Pt here for Blood pressure check per 03/16/23: Dr Abner Greenspan Return in about 2 months (around 05/16/2023) for 2-3 weeks rn bp and pulse. Check and lab appt for cmp.  (Last 2 bps were optimal.)  Pt currently takes: Amlodipine 5 mg Lasix 20 mg as needed Losartan 50 mg BID Metoprolol 50 mg BID (pt reported once daily)   Pt reports compliance with medication.   BP today @ = HR =   Pt advised per Dr Abner Greenspan:

## 2023-05-25 ENCOUNTER — Other Ambulatory Visit: Payer: 59

## 2023-05-25 ENCOUNTER — Ambulatory Visit: Payer: 59

## 2023-05-25 DIAGNOSIS — I10 Essential (primary) hypertension: Secondary | ICD-10-CM

## 2023-05-26 DIAGNOSIS — I5042 Chronic combined systolic (congestive) and diastolic (congestive) heart failure: Secondary | ICD-10-CM | POA: Diagnosis not present

## 2023-05-27 DIAGNOSIS — I5042 Chronic combined systolic (congestive) and diastolic (congestive) heart failure: Secondary | ICD-10-CM | POA: Diagnosis not present

## 2023-05-28 ENCOUNTER — Ambulatory Visit (INDEPENDENT_AMBULATORY_CARE_PROVIDER_SITE_OTHER): Payer: 59 | Admitting: Internal Medicine

## 2023-05-28 ENCOUNTER — Inpatient Hospital Stay: Payer: 59

## 2023-05-28 ENCOUNTER — Other Ambulatory Visit: Payer: Self-pay

## 2023-05-28 VITALS — BP 160/104 | HR 89 | Resp 16 | Ht 66.0 in | Wt 191.0 lb

## 2023-05-28 VITALS — BP 151/74 | HR 80 | Temp 98.4°F | Resp 18

## 2023-05-28 DIAGNOSIS — Z5189 Encounter for other specified aftercare: Secondary | ICD-10-CM | POA: Diagnosis not present

## 2023-05-28 DIAGNOSIS — R197 Diarrhea, unspecified: Secondary | ICD-10-CM | POA: Diagnosis not present

## 2023-05-28 DIAGNOSIS — C9000 Multiple myeloma not having achieved remission: Secondary | ICD-10-CM | POA: Diagnosis not present

## 2023-05-28 DIAGNOSIS — K521 Toxic gastroenteritis and colitis: Secondary | ICD-10-CM | POA: Diagnosis not present

## 2023-05-28 DIAGNOSIS — I10 Essential (primary) hypertension: Secondary | ICD-10-CM | POA: Diagnosis not present

## 2023-05-28 DIAGNOSIS — K580 Irritable bowel syndrome with diarrhea: Secondary | ICD-10-CM

## 2023-05-28 DIAGNOSIS — E860 Dehydration: Secondary | ICD-10-CM

## 2023-05-28 LAB — PROTEIN ELECTROPHORESIS, SERUM, WITH REFLEX
A/G Ratio: 0.6 — ABNORMAL LOW (ref 0.7–1.7)
Albumin ELP: 3.2 g/dL (ref 2.9–4.4)
Alpha-1-Globulin: 0.3 g/dL (ref 0.0–0.4)
Alpha-2-Globulin: 1 g/dL (ref 0.4–1.0)
Beta Globulin: 1.2 g/dL (ref 0.7–1.3)
Gamma Globulin: 2.9 g/dL — ABNORMAL HIGH (ref 0.4–1.8)
Globulin, Total: 5.4 g/dL — ABNORMAL HIGH (ref 2.2–3.9)
M-Spike, %: 2.2 g/dL — ABNORMAL HIGH
SPEP Interpretation: 0
Total Protein ELP: 8.6 g/dL — ABNORMAL HIGH (ref 6.0–8.5)

## 2023-05-28 LAB — IMMUNOFIXATION REFLEX, SERUM
IgA: 286 mg/dL (ref 87–352)
IgG (Immunoglobin G), Serum: 5574 mg/dL — ABNORMAL HIGH (ref 586–1602)
IgM (Immunoglobulin M), Srm: 210 mg/dL (ref 26–217)

## 2023-05-28 MED ORDER — SODIUM CHLORIDE 0.9 % IV SOLN: Freq: Once | INTRAVENOUS | Status: AC

## 2023-05-28 NOTE — Progress Notes (Signed)
RFV: follow up for diarrhea [name], female   DOB: 03-09-1956, 67 y.o.   MRN: [id number]  HPI Tami [name] is a 67yo F with plasma cell myeloma and hx of cdifficile and post infectious ibs. She has been doing well for 3 weeks, not requiring additional IVF. She did receive infusion of  and had post infusion where she had severe reaction -- requiring ED visit, electrolyte imbalance, headache, rigors, diarrhea. This 2nd time this has required urgent/emergent care. Still has ongoing diarrhea since having the infusion.   Has followed by dr Chales Abrahams. Continues to take lomotil  Has upcoming BM aspirate and PET scan for evaluation for myeloma.    Outpatient Encounter Medications as of 05/28/2023  Medication Sig   acyclovir (ZOVIRAX) 400 MG tablet Take 1 tablet (400 mg total) by mouth 2 (two) times daily.   albuterol (PROVENTIL) (2.5 MG/3ML) 0.083% nebulizer solution Take 3 mLs (2.5 mg total) by nebulization every 6 (six) hours as needed for wheezing or shortness of breath.   albuterol (VENTOLIN HFA) 108 (90 Base) MCG/ACT inhaler Inhale 2 puffs into the lungs every 6 (six) hours as needed for wheezing or shortness of breath.   amLODipine (NORVASC) 5 MG tablet Take 1 tablet (5 mg total) by mouth daily.   aspirin EC 81 MG tablet Take 81 mg by mouth daily. Swallow whole.   budesonide (PULMICORT) 0.5 MG/2ML nebulizer solution Take 2 mLs (0.5 mg total) by nebulization 2 (two) times daily as needed (cough, wheezing or shortness of breath).   cetirizine (ZYRTEC) 10 MG tablet Take 10 mg by mouth at bedtime.   Cholecalciferol (VITAMIN D3) 50 MCG (2000 UT) TABS Take 2,000 Units by mouth in the morning.   cholestyramine light (PREVALITE) 4 g packet Take 4 g by mouth 2 (two) times daily.   fluticasone (FLONASE) 50 MCG/ACT nasal spray Place 2 sprays into both nostrils daily.   hydrOXYzine (ATARAX) 10 MG tablet TAKE ONE (1) TABLET BY MOUTH 3 TIMES DAILY AS NEEDED FOR ITCHING   losartan (COZAAR) 50  MG tablet Take 50 mg by mouth in the morning and at bedtime.   Magnesium Glycinate 100 MG CAPS Take 200 mg by mouth at bedtime.   metoprolol succinate (TOPROL-XL) 50 MG 24 hr tablet Take 1 tablet (50 mg total) by mouth 2 (two) times daily. (Patient taking differently: Take 50 mg by mouth daily.)   Multiple Vitamin (QUINTABS) TABS Take 1 tablet by mouth daily.   potassium chloride SA (KLOR-CON M) 20 MEQ tablet Take 1 tablet (20 mEq total) by mouth daily.   Probiotic Product (ALIGN) 4 MG CAPS Take 1 capsule (4 mg total) by mouth daily.   spironolactone (ALDACTONE) 25 MG tablet Take 12.5 mg by mouth daily.   dicyclomine (BENTYL) 10 MG capsule Take 1 capsule (10 mg total) by mouth 4 (four) times daily -  before meals and at bedtime. (Patient taking differently: Take 10 mg by mouth 4 (four) times daily -  before meals and at bedtime. cramping)   diphenoxylate-atropine (LOMOTIL) 2.5-0.025 MG tablet Take 1 tablet by mouth 2 (two) times daily. (Patient not taking: Reported on 05/21/2023)   furosemide (LASIX) 20 MG tablet Take 20 mg by mouth daily as needed (fluid retention (feet swelling)). (Patient not taking: Reported on 05/21/2023)   hyoscyamine (LEVSIN SL) 0.125 MG SL tablet Place 1 tablet (0.125 mg total) under the tongue every 4 (four) hours as needed. (Patient not taking: Reported on 05/21/2023)   loperamide (IMODIUM) 2  MG capsule Take 4 mg by mouth in the morning and at bedtime. (Patient not taking: Reported on 05/21/2023)   meclizine (ANTIVERT) 12.5 MG tablet Take 12.5 mg by mouth 2 (two) times daily as needed for dizziness or nausea. (Patient not taking: Reported on 05/21/2023)   meloxicam (MOBIC) 15 MG tablet Take 15 mg by mouth daily. (Patient not taking: Reported on 05/21/2023)   nortriptyline (PAMELOR) 10 MG capsule Take 1 capsule (10 mg total) by mouth at bedtime. (Patient not taking: Reported on 05/21/2023)   oxyCODONE-acetaminophen (PERCOCET/ROXICET) 5-325 MG tablet Take 1 tablet by mouth every 6  (six) hours as needed for severe pain. (Patient not taking: Reported on 05/21/2023)   prochlorperazine (COMPAZINE) 10 MG tablet Take 0.5 tablets (5 mg total) by mouth 2 (two) times daily as needed for nausea or vomiting. (Patient not taking: Reported on 05/21/2023)   Facility-Administered Encounter Medications as of 05/28/2023  Medication   nitroGLYCERIN (NITROSTAT) SL tablet 0.4 mg     Patient Active Problem List   Diagnosis Date Noted   Chronic combined systolic and diastolic congestive heart failure (HCC) 03/28/2023   Intractable nausea and vomiting 03/27/2023   Asthma 12/20/2022   Diarrhea    Nausea and vomiting    Hypocalcemia 08/25/2022   Iron deficiency anemia due to chronic blood loss 05/28/2022   Chronic diarrhea 05/27/2022   Soft tissue lesion 04/28/2022   Obesity (BMI 30-39.9) 04/28/2022   SBO (small bowel obstruction) (HCC) 04/27/2022   Dehydration 04/03/2022   Acute vaginitis 03/18/2022   Cervical cancer screening 03/18/2022   Dizziness 12/11/2021   Visual changes 11/26/2021   Ear fullness, bilateral 08/12/2021   Bilateral knee pain 08/08/2021   History of 2019 novel coronavirus disease (COVID-19) 12/06/2020   Vitamin deficiency 07/31/2020   Acute combined systolic and diastolic heart failure (HCC) 05/09/2020   Hypomagnesemia 03/04/2020   Thrombocytopenia (HCC) 03/04/2020   Dysphagia 03/04/2020   Constipation 03/04/2020   Sensation of pressure in bladder area 01/17/2020   Hyperglycemia 01/17/2020   Peripheral neuropathy 11/15/2019   Chronic migraine without aura, with intractable migraine, so stated, with status migrainosus 10/17/2019   ETD (Eustachian tube dysfunction), bilateral 09/12/2019   Urinary frequency 07/17/2019   Headache 07/17/2019   Palpitation 05/15/2019   Clostridium difficile diarrhea 09/04/2018   Thyroid nodule 08/26/2018   Multiple myeloma (HCC) 07/20/2018   Multiple myeloma not having achieved remission (HCC) 07/20/2018   Hematuria  07/11/2018   Seasonal allergies 06/23/2018   Right shoulder pain 06/07/2018   Neck pain 06/07/2018   Paresthesias 06/07/2018   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   Pain in thoracic spine 03/18/2018   Thoracic back pain 02/15/2018   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   Hypokalemia 08/20/2017   Anemia 08/20/2017   Elevated sed rate 08/15/2017   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   Epigastric abdominal pain 03/22/2017   Myalgia 12/31/2016   Abnormal SPEP 07/26/2016   Low back pain 07/14/2016   Insomnia 12/29/2015   Tension headache 10/21/2015   Muscle spasms of neck 10/18/2015   Dysuria 10/13/2015   AP (abdominal pain) 07/07/2015   Cardiomyopathy (HCC) 04/19/2015   Chest pain 04/19/2015   Obstructive sleep apnea 03/31/2015   Congestive heart failure (HCC) 02/24/2015   Arthritis    Hyperlipidemia    Hypertension    Depression    Stroke [facility])    GERD (gastroesophageal reflux disease)    Bowel obstruction (HCC) 11/09/2014     Health Maintenance Due  Topic  Date Due   Pneumonia Vaccine 3+ Years old (4 of 4 - PPSV23 or PCV20) 06/02/2022     Review of Systems +diarhrea. 12 point ros is otherwise negative Physical Exam   BP (!) 160/104   Pulse 89   Resp 16   Ht 5\' 6"  (1.676 m)   Wt 191 lb (86.6 kg)   SpO2 98%   BMI 30.83 kg/m   Physical Exam  Constitutional:  oriented to person, place, and time. appears well-developed and well-nourished. No distress.  HENT: [place]/AT, PERRLA, no scleral icterus Mouth/Throat: Oropharynx is clear and moist. No oropharyngeal exudate.  Neck = supple, no nuchal rigidity Abdominal: Soft. Bowel sounds are normal.  exhibits no distension. There is no tenderness.  Lymphadenopathy: no cervical adenopathy. No axillary adenopathy Neurological: alert and oriented to person, place, and time.  Skin: Skin is warm and dry. No rash noted. No erythema.  Psychiatric: a normal mood and affect.  behavior is normal.   Lab  Results  Component Value Date   LABRPR NON REACTIVE 08/23/2020    CBC Lab Results  Component Value Date   WBC 15.3 (H) 05/23/2023   RBC 3.99 05/23/2023   HGB 11.5 (L) 05/23/2023   HCT 35.2 (L) 05/23/2023   PLT 105 (L) 05/23/2023   MCV 88.2 05/23/2023   MCH 28.8 05/23/2023   MCHC 32.7 05/23/2023   RDW 13.9 05/23/2023   LYMPHSABS 1.6 05/23/2023   MONOABS 1.1 (H) 05/23/2023   EOSABS 0.1 05/23/2023    BMET Lab Results  Component Value Date   NA 137 05/23/2023   K 3.4 (L) 05/23/2023   CL 106 05/23/2023   CO2 21 (L) 05/23/2023   GLUCOSE 113 (H) 05/23/2023   BUN 15 05/23/2023   CREATININE 0.70 05/23/2023   CALCIUM 9.1 05/23/2023   GFRNONAA >60 05/23/2023   GFRAA >60 08/10/2020      Assessment and Plan Drug associated diarrhea = appears to Continue to recover; feeling better each day; at this time, do not recommend to  retest for cdiff  She will be following up with cancer center to see dr Myna Hidalgo for ivfluids  Hypertension = on repeat checks, he is still above goal. At times she does have hypotension related to hypovolemia/diarrhea. Defer to dr blyth should need to adjust medications  Myeloma = has upcoming BM aspirate and PET scan  Rtc in 2 months

## 2023-05-28 NOTE — Patient Instructions (Signed)
Dehydration, Adult Dehydration is a condition in which there is not enough water or other fluids in the body. This happens when a person loses more fluids than they take in. Important organs cannot work right without the right amount of fluids. Any loss of fluids from the body can cause dehydration. Dehydration can be mild, worse, or very bad. It should be treated right away to keep it from getting very bad. What are the causes? Conditions that cause loss of water in the body. They include: Watery poop (diarrhea). Vomiting. Sweating a lot. Fever. Infection. Peeing (urinating) a lot. Not drinking enough fluids. Certain medicines, such as medicines that take extra fluid out of the body (diuretics). Lack of safe drinking water. Not being able to get enough water and food. What increases the risk? Having a long-term (chronic) illness that has not been treated the right way, such as: Diabetes. Heart disease. Kidney disease. Being 65 years of age or older. Having a disability. Living in a place that is high above the ground or sea (high in altitude). The thinner, drier air causes more fluid loss. Doing exercises that put stress on your body for a long time. Being active when in hot places. What are the signs or symptoms? Symptoms of dehydration depend on how bad it is. Mild or worse dehydration Thirst. Dry lips or dry mouth. Feeling dizzy or light-headed. Muscle cramps. Passing little pee or dark pee. Pee may be the color of tea. Headache. Very bad dehydration Changes in skin. Skin may: Be cold to the touch (clammy). Be blotchy or pale. Not go back to normal right after you pinch it and let it go. Little or no tears, pee, or sweat. Fast breathing. Low blood pressure. Weak pulse. Pulse that is more than 100 beats a minute when you are sitting still. Other changes, such as: Feeling very thirsty. Eyes that look hollow (sunken). Cold hands and feet. Being confused. Being very  tired (lethargic) or having trouble waking from sleep. Losing weight. Loss of consciousness. How is this treated? Treatment for this condition depends on how bad your dehydration is. Treatment should start right away. Do not wait until your condition gets very bad. Very bad dehydration is an emergency. You will need to go to a hospital. Mild or worse dehydration can be treated at home. You may be asked to: Drink more fluids. Drink an oral rehydration solution (ORS). This drink gives you the right amount of fluids, salts, and minerals (electrolytes). Very bad dehydration can be treated: With fluids through an IV tube. By correcting low levels of electrolytes in the body. By treating the problem that caused your dehydration. Follow these instructions at home: Oral rehydration solution If told by your doctor, drink an ORS: Make an ORS. Use instructions on the package. Start by drinking small amounts, about  cup (120 mL) every 5-10 minutes. Slowly drink more until you have had the amount that your doctor said to have.  Eating and drinking  Drink enough clear fluid to keep your pee pale yellow. If you were told to drink an ORS, finish the ORS first. Then, start slowly drinking other clear fluids. Drink fluids such as: Water. Do not drink only water. Doing that can make the salt (sodium) level in your body get too low. Water from ice chips you suck on. Fruit juice that you have added water to (diluted). Low-calorie sports drinks. Eat foods that have the right amounts of salts and minerals, such as bananas, oranges, potatoes,   tomatoes, or spinach. Do not drink alcohol. Avoid drinks that have caffeine or sugar. These include:: High-calorie sports drinks. Fruit juice that you did not add water to. Soda. Coffee or energy drinks. Avoid foods that are greasy or have a lot of fat or sugar. General instructions Take over-the-counter and prescription medicines only as told by your doctor. Do  not take sodium tablets. Doing that can make the salt level in your body get too high. Return to your normal activities as told by your doctor. Ask your doctor what activities are safe for you. Keep all follow-up visits. Your doctor may check and change your treatment. Contact a doctor if: You have pain in your belly (abdomen) and the pain: Gets worse. Stays in one place. You have a rash. You have a stiff neck. You get angry or annoyed more easily than normal. You are more tired or have a harder time waking than normal. You feel weak or dizzy. You feel very thirsty. Get help right away if: You have any symptoms of very bad dehydration. You vomit every time you eat or drink. Your vomiting gets worse, does not go away, or you vomit blood or green stuff. You are getting treatment, but symptoms are getting worse. You have a fever. You have a very bad headache. You have: Diarrhea that gets worse or does not go away. Blood in your poop (stool). This may cause poop to look black and tarry. No pee in 6-8 hours. Only a small amount of pee in 6-8 hours, and the pee is very dark. You have trouble breathing. These symptoms may be an emergency. Get help right away. Call 911. Do not wait to see if the symptoms will go away. Do not drive yourself to the hospital. This information is not intended to replace advice given to you by your health care provider. Make sure you discuss any questions you have with your health care provider. Document Revised: 05/25/2022 Document Reviewed: 05/25/2022 Elsevier Patient Education  2024 [employer]vier Inc.  

## 2023-05-31 ENCOUNTER — Ambulatory Visit: Payer: 59 | Admitting: Internal Medicine

## 2023-05-31 DIAGNOSIS — I5042 Chronic combined systolic (congestive) and diastolic (congestive) heart failure: Secondary | ICD-10-CM | POA: Diagnosis not present

## 2023-06-02 ENCOUNTER — Ambulatory Visit: Payer: Self-pay | Admitting: Licensed Clinical Social Worker

## 2023-06-02 DIAGNOSIS — I5042 Chronic combined systolic (congestive) and diastolic (congestive) heart failure: Secondary | ICD-10-CM | POA: Diagnosis not present

## 2023-06-02 NOTE — Patient Outreach (Signed)
  Care Coordination  Follow Up Visit Note   06/02/2023 Name: Tami [name] MRN: [id number] DOB: 1956/10/30  Tami [name] is a 67 y.o. year old female who sees [name], MD for primary care. I spoke with  [name] by phone today.  What matters to the patients health and wellness today?  Managing health and exploring all home support options  Patient was accompanied by her HPOA [name]  who provided information during this encounter..     Goals Addressed             This Visit's Progress    explore CAPS program       Activities and task to complete in order to accomplish goals.   Personal Care is currently in place  please contact [employer] [phone] if you have any questions or concerns.  We can also work on Scientific laboratory technician set up. Per your request I have started the process for the CAPS program, you will receive a packet in the mail from [facility] assessments and interventions completed:  No  Care Coordination Interventions:  Yes, provided  Interventions Today    Flowsheet Row Most Recent Value  Chronic Disease   Chronic disease during today's visit Hypertension (HTN), Congestive Heart Failure (CHF)  General Interventions   General Interventions Discussed/Reviewed General Interventions Reviewed, Level of Care  Level of Care Personal Care Services  [has PCS in place, things are going well]  Applications Other  [CAPS completed and submitted today]  Education Interventions   Education Provided Provided Education  Provided Verbal Education On [employer], Applications  [CAPS program]  Applications Other  [CAPS completed and submitted today]       Follow up plan: Follow up call scheduled for 30 days    Encounter Outcome:  Pt. Visit Completed   [name], LCSW Social Work Care Coordination  [facility] [employer] [employer] [phone]

## 2023-06-02 NOTE — Patient Instructions (Signed)
Social Work Visit Information  Thank you for taking time to visit with me today. Please don't hesitate to contact me if I can be of assistance to you.   Following are the goals we discussed today:   Goals Addressed             This Visit's Progress    explore CAPS program       Activities and task to complete in order to accomplish goals.   Personal Care is currently in place  please contact [employer] [phone] if you have any questions or concerns.  We can also work on Scientific laboratory technician set up. Per your request I have started the process for the CAPS program, you will receive a packet in the mail from [place]          Our next appointment is by telephone on 06/30/23 at 10:15   Please call the care guide team at [phone] if you need to cancel or reschedule your appointment.   The patient verbalized understanding of instructions, educational materials, and care plan provided today and DECLINED offer to receive copy of patient instructions, educational materials, and care plan.     [name], LCSW Social Work Care Coordination  [facility] [employer] [employer] [phone]

## 2023-06-03 DIAGNOSIS — I5042 Chronic combined systolic (congestive) and diastolic (congestive) heart failure: Secondary | ICD-10-CM | POA: Diagnosis not present

## 2023-06-07 DIAGNOSIS — M25562 Pain in left knee: Secondary | ICD-10-CM | POA: Diagnosis not present

## 2023-06-07 DIAGNOSIS — I5042 Chronic combined systolic (congestive) and diastolic (congestive) heart failure: Secondary | ICD-10-CM | POA: Diagnosis not present

## 2023-06-07 DIAGNOSIS — M1712 Unilateral primary osteoarthritis, left knee: Secondary | ICD-10-CM | POA: Diagnosis not present

## 2023-06-08 ENCOUNTER — Ambulatory Visit: Payer: 59

## 2023-06-09 DIAGNOSIS — I5042 Chronic combined systolic (congestive) and diastolic (congestive) heart failure: Secondary | ICD-10-CM | POA: Diagnosis not present

## 2023-06-10 DIAGNOSIS — I5042 Chronic combined systolic (congestive) and diastolic (congestive) heart failure: Secondary | ICD-10-CM | POA: Diagnosis not present

## 2023-06-10 DIAGNOSIS — G4733 Obstructive sleep apnea (adult) (pediatric): Secondary | ICD-10-CM | POA: Diagnosis not present

## 2023-06-11 ENCOUNTER — Inpatient Hospital Stay: Payer: 59 | Attending: Hematology & Oncology

## 2023-06-11 ENCOUNTER — Encounter: Payer: Self-pay | Admitting: Hematology & Oncology

## 2023-06-11 ENCOUNTER — Inpatient Hospital Stay: Payer: 59 | Attending: Hematology & Oncology | Admitting: Hematology & Oncology

## 2023-06-11 ENCOUNTER — Other Ambulatory Visit: Payer: Self-pay

## 2023-06-11 VITALS — BP 147/78 | HR 72 | Temp 98.4°F | Resp 18 | Ht 66.0 in | Wt 191.0 lb

## 2023-06-11 VITALS — BP 160/89 | HR 67 | Temp 98.4°F | Resp 18

## 2023-06-11 DIAGNOSIS — D500 Iron deficiency anemia secondary to blood loss (chronic): Secondary | ICD-10-CM

## 2023-06-11 DIAGNOSIS — C9000 Multiple myeloma not having achieved remission: Secondary | ICD-10-CM

## 2023-06-11 DIAGNOSIS — C9002 Multiple myeloma in relapse: Secondary | ICD-10-CM | POA: Diagnosis not present

## 2023-06-11 DIAGNOSIS — D801 Nonfamilial hypogammaglobulinemia: Secondary | ICD-10-CM

## 2023-06-11 DIAGNOSIS — R197 Diarrhea, unspecified: Secondary | ICD-10-CM | POA: Diagnosis not present

## 2023-06-11 DIAGNOSIS — E860 Dehydration: Secondary | ICD-10-CM

## 2023-06-11 DIAGNOSIS — Z8639 Personal history of other endocrine, nutritional and metabolic disease: Secondary | ICD-10-CM | POA: Insufficient documentation

## 2023-06-11 MED ORDER — SODIUM CHLORIDE 0.9 % IV SOLN: Freq: Once | INTRAVENOUS | Status: AC

## 2023-06-11 MED ORDER — CLONIDINE HCL 0.1 MG PO TABS
0.2000 mg | ORAL_TABLET | Freq: Once | ORAL | Status: AC
  Administered 2023-06-11: 0.2 mg via ORAL

## 2023-06-11 NOTE — Progress Notes (Signed)
Patient's blood pressure elevated 184/156.  States she has not taken her bp med today.  Denies headache or nausea, and will report finding to Dr. Myna Hidalgo.  Reading retaken in right arm and was 160/108.

## 2023-06-11 NOTE — Progress Notes (Signed)
DISCONTINUE ON PATHWAY REGIMEN - Multiple Myeloma and Other Plasma Cell Dyscrasias     Cycles 1 and 2: A cycle is every 28 days:     Daratumumab and hyaluronidase-fihj    Cycles 3 through 6: A cycle is every 28 days:     Daratumumab and hyaluronidase-fihj    Cycles 7 and beyond: A cycle is every 28 days:     Daratumumab and hyaluronidase-fihj   **Always confirm dose/schedule in your pharmacy ordering system**  REASON: Toxicities / Adverse Event PRIOR TREATMENT: [id number]: Daratumumab/hyaluronidase SUBQ (1,800/30,000) q28 Days Until Progression or Unacceptable Toxicity TREATMENT RESPONSE: Unable to Evaluate  START OFF PATHWAY REGIMEN - Multiple Myeloma and Other Plasma Cell Dyscrasias   [id number]:Carfilzomib 20/56 mg/m2 IV + Dexamethasone 20 mg PO/IV + Isatuximab 10 mg/kg IV q28 Days:   Cycle 1: A cycle is 28 days:     Dexamethasone      Isatuximab-irfc      Carfilzomib      Carfilzomib    Cycles 2 and beyond: A cycle is every 28 days:     Dexamethasone      Isatuximab-irfc      Carfilzomib   **Always confirm dose/schedule in your pharmacy ordering system**  Patient Characteristics: Multiple Myeloma, Relapsed / Refractory, Second through Fourth Lines of Therapy, Not a Candidate for CAR T-cell Therapy, Fit or Candidate for Triplet Therapy, Not Lenalidomide-Refractory and Lenalidomide-based Regimen Preferred, Candidate for Anti-CD38  Antibody Disease Classification: Multiple Myeloma Therapeutic Status: Relapsed R2-ISS Staging: III Line of Therapy: Third Line Anti-CD38 Antibody Candidacy: Candidate for Anti-CD38 Antibody Lenalidomide-based Regimen Preference/Candidacy: Not Lenalidomide-Refractory and Lenalidomide-based Regimen Preferred Intent of Therapy: Non-Curative / Palliative Intent, Discussed with Patient

## 2023-06-11 NOTE — Patient Instructions (Signed)
Dehydration, Adult Dehydration is a condition in which there is not enough water or other fluids in the body. This happens when a person loses more fluids than they take in. Important organs cannot work right without the right amount of fluids. Any loss of fluids from the body can cause dehydration. Dehydration can be mild, worse, or very bad. It should be treated right away to keep it from getting very bad. What are the causes? Conditions that cause loss of water in the body. They include: Watery poop (diarrhea). Vomiting. Sweating a lot. Fever. Infection. Peeing (urinating) a lot. Not drinking enough fluids. Certain medicines, such as medicines that take extra fluid out of the body (diuretics). Lack of safe drinking water. Not being able to get enough water and food. What increases the risk? Having a long-term (chronic) illness that has not been treated the right way, such as: Diabetes. Heart disease. Kidney disease. Being 65 years of age or older. Having a disability. Living in a place that is high above the ground or sea (high in altitude). The thinner, drier air causes more fluid loss. Doing exercises that put stress on your body for a long time. Being active when in hot places. What are the signs or symptoms? Symptoms of dehydration depend on how bad it is. Mild or worse dehydration Thirst. Dry lips or dry mouth. Feeling dizzy or light-headed. Muscle cramps. Passing little pee or dark pee. Pee may be the color of tea. Headache. Very bad dehydration Changes in skin. Skin may: Be cold to the touch (clammy). Be blotchy or pale. Not go back to normal right after you pinch it and let it go. Little or no tears, pee, or sweat. Fast breathing. Low blood pressure. Weak pulse. Pulse that is more than 100 beats a minute when you are sitting still. Other changes, such as: Feeling very thirsty. Eyes that look hollow (sunken). Cold hands and feet. Being confused. Being very  tired (lethargic) or having trouble waking from sleep. Losing weight. Loss of consciousness. How is this treated? Treatment for this condition depends on how bad your dehydration is. Treatment should start right away. Do not wait until your condition gets very bad. Very bad dehydration is an emergency. You will need to go to a hospital. Mild or worse dehydration can be treated at home. You may be asked to: Drink more fluids. Drink an oral rehydration solution (ORS). This drink gives you the right amount of fluids, salts, and minerals (electrolytes). Very bad dehydration can be treated: With fluids through an IV tube. By correcting low levels of electrolytes in the body. By treating the problem that caused your dehydration. Follow these instructions at home: Oral rehydration solution If told by your doctor, drink an ORS: Make an ORS. Use instructions on the package. Start by drinking small amounts, about  cup (120 mL) every 5-10 minutes. Slowly drink more until you have had the amount that your doctor said to have.  Eating and drinking  Drink enough clear fluid to keep your pee pale yellow. If you were told to drink an ORS, finish the ORS first. Then, start slowly drinking other clear fluids. Drink fluids such as: Water. Do not drink only water. Doing that can make the salt (sodium) level in your body get too low. Water from ice chips you suck on. Fruit juice that you have added water to (diluted). Low-calorie sports drinks. Eat foods that have the right amounts of salts and minerals, such as bananas, oranges, potatoes,   tomatoes, or spinach. Do not drink alcohol. Avoid drinks that have caffeine or sugar. These include:: High-calorie sports drinks. Fruit juice that you did not add water to. Soda. Coffee or energy drinks. Avoid foods that are greasy or have a lot of fat or sugar. General instructions Take over-the-counter and prescription medicines only as told by your doctor. Do  not take sodium tablets. Doing that can make the salt level in your body get too high. Return to your normal activities as told by your doctor. Ask your doctor what activities are safe for you. Keep all follow-up visits. Your doctor may check and change your treatment. Contact a doctor if: You have pain in your belly (abdomen) and the pain: Gets worse. Stays in one place. You have a rash. You have a stiff neck. You get angry or annoyed more easily than normal. You are more tired or have a harder time waking than normal. You feel weak or dizzy. You feel very thirsty. Get help right away if: You have any symptoms of very bad dehydration. You vomit every time you eat or drink. Your vomiting gets worse, does not go away, or you vomit blood or green stuff. You are getting treatment, but symptoms are getting worse. You have a fever. You have a very bad headache. You have: Diarrhea that gets worse or does not go away. Blood in your poop (stool). This may cause poop to look black and tarry. No pee in 6-8 hours. Only a small amount of pee in 6-8 hours, and the pee is very dark. You have trouble breathing. These symptoms may be an emergency. Get help right away. Call 911. Do not wait to see if the symptoms will go away. Do not drive yourself to the hospital. This information is not intended to replace advice given to you by your health care provider. Make sure you discuss any questions you have with your health care provider. Document Revised: 05/25/2022 Document Reviewed: 05/25/2022 Elsevier Patient Education  2024 [employer]vier Inc.  

## 2023-06-11 NOTE — Progress Notes (Signed)
Dr. Myna Hidalgo notified of elevated BP's of 184/156 and 160/108 from this morning.  Order received to give pt Clonidine 0.2 mg PO now prior to IVF's per Dr. Myna Hidalgo.

## 2023-06-11 NOTE — Progress Notes (Signed)
Hematology and Oncology Follow Up Visit  KALIS FRIESE [id number] 10/28/1956 67 y.o. 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 Claros is here today for follow-up.  Unfortunately, I think we are at the point that we are going to have to start her on treatment.  Her last IgG level was 5574 mg/dL.  Monoclonal spike was holding steady at 2.2 g/dL.  The Kappa light chain was 8.3 mg/dL.  I just do not want to see her protein levels get higher and she will start having problems.  She is in for IV fluid.  She has problems with diarrhea.  She has diarrhea on occasion.  So not sure as to what the etiology of this diarrhea is.  I know's use and has been seen by Gastroenterology.  She has had no issues with fever.  She has had no problems with her pacemaker.  She has had no rashes.  There is been no leg swelling.  I had a long talk with her.  I explained why I thought we needed to start her on treatment.  I think that she is going to need to have a Port-A-Cath placed.  I know she has a pacemaker in.  However, I think a Port-A-Cath would be okay.  She has had no problems with bleeding.  I think that the combination of Sarclisa/Kyprolis/Cytoxan would not be a bad idea for her.  I know that she has had problems with chemotherapy in the past.  We clearly will have to make adjustments to her dosing.  She is scheduled for a bone marrow biopsy in August.  This will be done the on August 16.  I am waiting to have the bone marrow biopsy done.  I think she is also going to have  a PET scan.  I will try to see if the Port-A-Cath can get put in the same day.  I know that the IV fluids do make her feel better.  Currently, I would say that her performance status is probably ECOG 1.     Medications:  Allergies as of 06/11/2023       Reactions   Benazepril Anaphylaxis, Swelling, Hives   angioedema Throat and lip swelling   Ondansetron Hcl Hives   Redness and hives post IV admin on 07/05/17   Codeine Nausea And Vomiting   Morphine Hives   Redness and hives noted post IV admin on 07/05/17        Medication List        Accurate as of June 11, 2023 12:16 PM. If you have any questions, ask your nurse or doctor.          acyclovir 400 MG tablet Commonly known as: ZOVIRAX Take 1 tablet (400 mg total) by mouth 2 (two) times daily.   albuterol 108 (90 Base) MCG/ACT inhaler Commonly known as: VENTOLIN HFA Inhale 2 puffs into the lungs every 6 (six) hours as needed for wheezing or shortness of breath.   albuterol (2.5 MG/3ML) 0.083% nebulizer solution Commonly known as: PROVENTIL Take  3 mLs (2.5 mg total) by nebulization every 6 (six) hours as needed for wheezing or shortness of breath.   Align 4 MG Caps Take 1 capsule (4 mg total) by mouth daily.   amLODipine 5 MG tablet Commonly known as: NORVASC Take 1 tablet (5 mg total) by mouth daily.   aspirin EC 81 MG tablet Take 81 mg by mouth daily. Swallow whole.   budesonide 0.5 MG/2ML nebulizer solution Commonly known as: Pulmicort Take 2 mLs (0.5 mg total) by nebulization 2 (two) times daily as needed (cough, wheezing or shortness of breath).   cetirizine 10 MG tablet Commonly known as: ZYRTEC Take 10 mg by mouth at bedtime.   cholestyramine light 4 g packet Commonly known as: PREVALITE Take 4 g by mouth 2 (two) times daily.   dicyclomine 10 MG capsule Commonly known as: BENTYL Take 1 capsule (10 mg total) by mouth 4 (four) times daily -  before meals and at bedtime. What changed: additional  instructions   diphenoxylate-atropine 2.5-0.025 MG tablet Commonly known as: Lomotil Take 1 tablet by mouth 2 (two) times daily.   fluticasone 50 MCG/ACT nasal spray Commonly known as: FLONASE Place 2 sprays into both nostrils daily.   furosemide 20 MG tablet Commonly known as: LASIX Take 20 mg by mouth daily as needed (fluid retention (feet swelling)).   hydrOXYzine 10 MG tablet Commonly known as: ATARAX TAKE ONE (1) TABLET BY MOUTH 3 TIMES DAILY AS NEEDED FOR ITCHING   hyoscyamine 0.125 MG SL tablet Commonly known as: LEVSIN SL Place 1 tablet (0.125 mg total) under the tongue every 4 (four) hours as needed.   loperamide 2 MG capsule Commonly known as: IMODIUM Take 4 mg by mouth in the morning and at bedtime.   losartan 50 MG tablet Commonly known as: COZAAR Take 50 mg by mouth in the morning and at bedtime.   Magnesium Glycinate 100 MG Caps Take 200 mg by mouth at bedtime.   meclizine 12.5 MG tablet Commonly known as: ANTIVERT Take 12.5 mg by mouth 2 (two) times daily as needed for dizziness or nausea.   meloxicam 15 MG tablet Commonly known as: MOBIC Take 15 mg by mouth daily.   metoprolol succinate 50 MG 24 hr tablet Commonly known as: TOPROL-XL Take 1 tablet (50 mg total) by mouth 2 (two) times daily. What changed: when to take this   nortriptyline 10 MG capsule Commonly known as: Pamelor Take 1 capsule (10 mg total) by mouth at bedtime.   oxyCODONE-acetaminophen 5-325 MG tablet Commonly known as: PERCOCET/ROXICET Take 1 tablet by mouth every 6 (six) hours as needed for severe pain.   potassium chloride SA 20 MEQ tablet Commonly known as: KLOR-CON M Take 1 tablet (20 mEq total) by mouth daily.   prochlorperazine 10 MG tablet Commonly known as: COMPAZINE Take 0.5 tablets (5 mg total) by mouth 2 (two) times daily as needed for nausea or vomiting.   Quintabs Tabs Take 1 tablet by mouth daily.   spironolactone 25 MG tablet Commonly known as:  ALDACTONE Take 12.5 mg by mouth daily.   Vitamin D3 50 MCG (2000 UT) Tabs Take 2,000 Units by mouth in the morning.        Allergies:  Allergies  Allergen Reactions   Benazepril Anaphylaxis, Swelling and Hives    angioedema Throat and lip swelling   Ondansetron Hcl Hives    Redness and hives post IV admin on 07/05/17   Codeine Nausea And Vomiting   Morphine Hives  Redness and hives noted post IV admin on 07/05/17    Past Medical History, Surgical history, Social history, and Family History were reviewed and updated.  Review of Systems: Review of Systems  Constitutional: Negative.   HENT: Negative.    Eyes: Negative.   Respiratory: Negative.    Cardiovascular: Negative.   Gastrointestinal: Negative.   Genitourinary: Negative.   Musculoskeletal: Negative.   Skin: Negative.   Neurological: Negative.   Endo/Heme/Allergies: Negative.   Psychiatric/Behavioral: Negative.        Physical Exam: Temperature is 98.4.  Pulse 79.  Blood pressure 147/78.  Weight is 191 pounds.   Wt Readings from Last 3 Encounters:  06/11/23 191 lb (86.6 kg)  05/28/23 191 lb (86.6 kg)  05/23/23 191 lb (86.6 kg)    Physical Exam Vitals reviewed.  HENT:     Head: Normocephalic and atraumatic.  Eyes:     Pupils: Pupils are equal, round, and reactive to light.     Comments: Mild pallor of conjunctiva  Cardiovascular:     Rate and Rhythm: Normal rate and regular rhythm.     Heart sounds: Normal heart sounds.  Pulmonary:     Effort: Pulmonary effort is normal.     Breath sounds: Normal breath sounds.  Abdominal:     General: Bowel sounds are normal.     Palpations: Abdomen is soft.  Musculoskeletal:        General: No tenderness or deformity. Normal range of motion.     Cervical back: Normal range of motion.  Lymphadenopathy:     Cervical: No cervical adenopathy.  Skin:    General: Skin is warm and dry.     Findings: No erythema or rash.  Neurological:     Mental Status: She  is alert and oriented to person, place, and time.  Psychiatric:        Behavior: Behavior normal.        Thought Content: Thought content normal.        Judgment: Judgment normal.     Lab Results  Component Value Date   WBC 15.3 (H) 05/23/2023   HGB 11.5 (L) 05/23/2023   HCT 35.2 (L) 05/23/2023   MCV 88.2 05/23/2023   PLT 105 (L) 05/23/2023   Lab Results  Component Value Date   FERRITIN 402 (H) 02/22/2023   IRON 66 02/22/2023   TIBC 346 02/22/2023   UIBC 280 02/22/2023   IRONPCTSAT 19 02/22/2023   Lab Results  Component Value Date   RETICCTPCT 1.2 11/27/2022   RBC 3.99 05/23/2023   RETICCTABS 32,640 07/16/2016   Lab Results  Component Value Date   KPAFRELGTCHN 82.6 (H) 05/21/2023   LAMBDASER 7.3 05/21/2023   KAPLAMBRATIO 11.32 (H) 05/21/2023   Lab Results  Component Value Date   IGGSERUM 3,678 (H) 05/21/2023   IGGSERUM 5,574 (H) 05/21/2023   IGA 172 05/21/2023   IGA 286 05/21/2023   IGMSERUM 127 05/21/2023   IGMSERUM 210 05/21/2023   Lab Results  Component Value Date   TOTALPROTELP 8.6 (H) 05/21/2023   ALBUMINELP 3.2 05/21/2023   A1GS 0.3 05/21/2023   A2GS 1.0 05/21/2023   BETS 1.2 05/21/2023   BETA2SER 0.5 07/16/2016   GAMS 2.9 (H) 05/21/2023   MSPIKE 2.2 (H) 05/21/2023   SPEI Comment 10/27/2022     Chemistry      Component Value Date/Time   NA 137 05/23/2023 2019   NA 143 11/08/2017 1127   NA 140 12/31/2016 1000   K 3.4 (L) 05/23/2023  2019   K 4.0 11/08/2017 1127   K 3.6 12/31/2016 1000   CL 106 05/23/2023 2019   CL 107 11/08/2017 1127   CO2 21 (L) 05/23/2023 2019   CO2 25 11/08/2017 1127   CO2 24 12/31/2016 1000   BUN 15 05/23/2023 2019   BUN 19 11/08/2017 1127   BUN 22.3 12/31/2016 1000   CREATININE 0.70 05/23/2023 2019   CREATININE 0.67 05/21/2023 1224   CREATININE 0.9 11/08/2017 1127   CREATININE 0.8 12/31/2016 1000      Component Value Date/Time   CALCIUM 9.1 05/23/2023 2019   CALCIUM 9.9 11/08/2017 1127   CALCIUM 9.5 12/31/2016  1000   ALKPHOS 45 05/21/2023 1224   ALKPHOS 44 11/08/2017 1127   ALKPHOS 51 12/31/2016 1000   AST 30 05/21/2023 1224   AST 28 12/31/2016 1000   ALT 15 05/21/2023 1224   ALT 36 11/08/2017 1127   ALT 16 12/31/2016 1000   BILITOT 0.5 05/21/2023 1224   BILITOT 0.46 12/31/2016 1000       Impression and Plan: Ms. Hocevar is a very pleasant 67 yo African American female with a previous IgG kappa MGUS with progression to myeloma.  Again, we will go ahead and get her set up with treatment.  I think we still start after Labor Day.  I know this will make her feel better.  She will need to have the Port-A-Cath placed.  We will be incredibly interesting to see what the cytogenetics are with respect to her FISH panel.  I do think that we can help her out.  I think that she will do okay with chemotherapy.  Will have to make a dosage adjustment.  He will be interesting to see what the bone marrow shows and what the PET scan shows.  Again, we will try to get things set up for her after Labor Day.  She can always come in for IV fluids.  I know this makes her feel better    Josph Macho, MD 8/2/202412:16 PM

## 2023-06-12 ENCOUNTER — Other Ambulatory Visit: Payer: Self-pay

## 2023-06-13 NOTE — Assessment & Plan Note (Signed)
Following closely with oncology 

## 2023-06-13 NOTE — Assessment & Plan Note (Signed)
Check UA and culture 

## 2023-06-13 NOTE — Assessment & Plan Note (Signed)
Supplement and monitor 

## 2023-06-14 ENCOUNTER — Encounter: Payer: Self-pay | Admitting: Family Medicine

## 2023-06-14 ENCOUNTER — Other Ambulatory Visit (HOSPITAL_COMMUNITY)
Admission: RE | Admit: 2023-06-14 | Discharge: 2023-06-14 | Disposition: A | Discharge status: Home / Self Care | Payer: 59 | Source: Ambulatory Visit | Attending: Family Medicine | Admitting: Family Medicine

## 2023-06-14 ENCOUNTER — Ambulatory Visit (INDEPENDENT_AMBULATORY_CARE_PROVIDER_SITE_OTHER): Payer: 59 | Admitting: Family Medicine

## 2023-06-14 DIAGNOSIS — E785 Hyperlipidemia, unspecified: Secondary | ICD-10-CM | POA: Diagnosis not present

## 2023-06-14 DIAGNOSIS — R350 Frequency of micturition: Secondary | ICD-10-CM

## 2023-06-14 DIAGNOSIS — G8929 Other chronic pain: Secondary | ICD-10-CM

## 2023-06-14 DIAGNOSIS — C9000 Multiple myeloma not having achieved remission: Secondary | ICD-10-CM

## 2023-06-14 DIAGNOSIS — R300 Dysuria: Secondary | ICD-10-CM | POA: Diagnosis not present

## 2023-06-14 DIAGNOSIS — R739 Hyperglycemia, unspecified: Secondary | ICD-10-CM

## 2023-06-14 DIAGNOSIS — I10 Essential (primary) hypertension: Secondary | ICD-10-CM | POA: Diagnosis not present

## 2023-06-14 DIAGNOSIS — R109 Unspecified abdominal pain: Secondary | ICD-10-CM | POA: Diagnosis not present

## 2023-06-14 LAB — COMPREHENSIVE METABOLIC PANEL
ALT: 19 U/L (ref 0–35)
AST: 26 U/L (ref 0–37)
Albumin: 3.9 g/dL (ref 3.5–5.2)
Alkaline Phosphatase: 50 U/L (ref 39–117)
BUN: 20 mg/dL (ref 6–23)
CO2: 26 mEq/L (ref 19–32)
Calcium: 9.4 mg/dL (ref 8.4–10.5)
Chloride: 106 mEq/L (ref 96–112)
Creatinine, Ser: 0.65 mg/dL (ref 0.40–1.20)
GFR: 90.08 mL/min (ref >60.00)
Glucose, Bld: 97 mg/dL (ref 70–99)
Potassium: 4.5 mEq/L (ref 3.5–5.1)
Sodium: 138 mEq/L (ref 135–145)
Total Bilirubin: 0.4 mg/dL (ref 0.2–1.2)
Total Protein: 9.6 g/dL — ABNORMAL HIGH (ref 6.0–8.3)

## 2023-06-14 LAB — CBC WITH DIFFERENTIAL/PLATELET
Basophils Absolute: 0 10*3/uL (ref 0.0–0.1)
Basophils Relative: 1.2 % (ref 0.0–3.0)
Eosinophils Absolute: 0 10*3/uL (ref 0.0–0.7)
Eosinophils Relative: 1.2 % (ref 0.0–5.0)
HCT: 39.7 % (ref 36.0–46.0)
Hemoglobin: 12.5 g/dL (ref 12.0–15.0)
Lymphocytes Relative: 32.8 % (ref 12.0–46.0)
Lymphs Abs: 0.9 10*3/uL (ref 0.7–4.0)
MCHC: 31.4 g/dL (ref 30.0–36.0)
MCV: 91 fl (ref 78.0–100.0)
Monocytes Absolute: 0.3 10*3/uL (ref 0.1–1.0)
Monocytes Relative: 10.4 % (ref 3.0–12.0)
Neutro Abs: 1.5 10*3/uL (ref 1.4–7.7)
Neutrophils Relative %: 54.4 % (ref 43.0–77.0)
Platelets: 151 10*3/uL (ref 150.0–400.0)
RBC: 4.37 Mil/uL (ref 3.87–5.11)
RDW: 15.5 % (ref 11.5–15.5)
WBC: 2.8 10*3/uL — ABNORMAL LOW (ref 4.0–10.5)

## 2023-06-14 LAB — URINALYSIS, ROUTINE W REFLEX MICROSCOPIC
Bilirubin Urine: NEGATIVE
Ketones, ur: NEGATIVE
Nitrite: POSITIVE — AB
Specific Gravity, Urine: >=1.03 — AB (ref 1.000–1.030)
Total Protein, Urine: NEGATIVE
Urine Glucose: NEGATIVE
Urobilinogen, UA: 0.2 (ref 0.0–1.0)
pH: 6 (ref 5.0–8.0)

## 2023-06-14 LAB — POC URINALSYSI DIPSTICK (AUTOMATED)
Bilirubin, UA: NEGATIVE
Glucose, UA: NEGATIVE
Ketones, UA: NEGATIVE
Nitrite, UA: NEGATIVE
Protein, UA: NEGATIVE
Spec Grav, UA: 1.02 (ref 1.010–1.025)
Urobilinogen, UA: 0.2 E.U./dL
pH, UA: 5 (ref 5.0–8.0)

## 2023-06-14 LAB — MAGNESIUM: Magnesium: 1.8 mg/dL (ref 1.5–2.5)

## 2023-06-14 LAB — LIPID PANEL
Cholesterol: 179 mg/dL (ref 0–200)
HDL: 46.9 mg/dL (ref >39.00)
LDL Cholesterol: 113 mg/dL — ABNORMAL HIGH (ref 0–99)
NonHDL: 131.86
Total CHOL/HDL Ratio: 4
Triglycerides: 94 mg/dL (ref 0.0–149.0)
VLDL: 18.8 mg/dL (ref 0.0–40.0)

## 2023-06-14 LAB — TSH: TSH: 1.63 u[IU]/mL (ref 0.35–5.50)

## 2023-06-14 LAB — HEMOGLOBIN A1C: Hgb A1c MFr Bld: 5.8 % (ref 4.6–6.5)

## 2023-06-14 MED ORDER — SCOPOLAMINE 1 MG/3DAYS TD PT72: 1.0000 | MEDICATED_PATCH | TRANSDERMAL | 0 refills | Status: DC

## 2023-06-14 MED ORDER — ALPRAZOLAM 0.25 MG PO TABS: 0.2500 mg | ORAL_TABLET | Freq: Three times a day (TID) | ORAL | 1 refills | Status: DC | PRN

## 2023-06-14 MED ORDER — SERTRALINE HCL 50 MG PO TABS: 50.0000 mg | ORAL_TABLET | Freq: Every day | ORAL | 3 refills | Status: DC

## 2023-06-14 MED ORDER — NORTRIPTYLINE HCL 10 MG PO CAPS
10.0000 mg | ORAL_CAPSULE | Freq: Every day | ORAL | 1 refills | Status: DC
Start: 2023-06-14 — End: 2024-05-18

## 2023-06-14 MED ORDER — AMOXICILLIN-POT CLAVULANATE 875-125 MG PO TABS: 1.0000 | ORAL_TABLET | Freq: Two times a day (BID) | ORAL | 0 refills | Status: DC

## 2023-06-14 MED ORDER — FLUCONAZOLE 150 MG PO TABS: 150.0000 mg | ORAL_TABLET | ORAL | 1 refills | Status: DC

## 2023-06-14 NOTE — Patient Instructions (Signed)
Urinary Tract Infection, Adult  A urinary tract infection (UTI) is an infection of any part of the urinary tract. The urinary tract includes the kidneys, ureters, bladder, and urethra. These organs make, store, and get rid of urine in the body. An upper UTI affects the ureters and kidneys. A lower UTI affects the bladder and urethra. What are the causes? Most urinary tract infections are caused by bacteria in your genital area around your urethra, where urine leaves your body. These bacteria grow and cause inflammation of your urinary tract. What increases the risk? You are more likely to develop this condition if: You have a urinary catheter that stays in place. You are not able to control when you urinate or have a bowel movement (incontinence). You are female and you: Use a spermicide or diaphragm for birth control. Have low estrogen levels. Are pregnant. You have certain genes that increase your risk. You are sexually active. You take antibiotic medicines. You have a condition that causes your flow of urine to slow down, such as: An enlarged prostate, if you are female. Blockage in your urethra. A kidney stone. A nerve condition that affects your bladder control (neurogenic bladder). Not getting enough to drink, or not urinating often. You have certain medical conditions, such as: Diabetes. A weak disease-fighting system (immunesystem). Sickle cell disease. Gout. Spinal cord injury. What are the signs or symptoms? Symptoms of this condition include: Needing to urinate right away (urgency). Frequent urination. This may include small amounts of urine each time you urinate. Pain or burning with urination. Blood in the urine. Urine that smells bad or unusual. Trouble urinating. Cloudy urine. Vaginal discharge, if you are female. Pain in the abdomen or the lower back. You may also have: Vomiting or a decreased appetite. Confusion. Irritability or tiredness. A fever or  chills. Diarrhea. The first symptom in older adults may be confusion. In some cases, they may not have any symptoms until the infection has worsened. How is this diagnosed? This condition is diagnosed based on your medical history and a physical exam. You may also have other tests, including: Urine tests. Blood tests. Tests for STIs (sexually transmitted infections). If you have had more than one UTI, a cystoscopy or imaging studies may be done to determine the cause of the infections. How is this treated? Treatment for this condition includes: Antibiotic medicine. Over-the-counter medicines to treat discomfort. Drinking enough water to stay hydrated. If you have frequent infections or have other conditions such as a kidney stone, you may need to see a health care provider who specializes in the urinary tract (urologist). In rare cases, urinary tract infections can cause sepsis. Sepsis is a life-threatening condition that occurs when the body responds to an infection. Sepsis is treated in the hospital with IV antibiotics, fluids, and other medicines. Follow these instructions at home:  Medicines Take over-the-counter and prescription medicines only as told by your health care provider. If you were prescribed an antibiotic medicine, take it as told by your health care provider. Do not stop using the antibiotic even if you start to feel better. General instructions Make sure you: Empty your bladder often and completely. Do not hold urine for long periods of time. Empty your bladder after sex. Wipe from front to back after urinating or having a bowel movement if you are female. Use each tissue only one time when you wipe. Drink enough fluid to keep your urine pale yellow. Keep all follow-up visits. This is important. Contact a health   care provider if: Your symptoms do not get better after 1-2 days. Your symptoms go away and then return. Get help right away if: You have severe pain in  your back or your lower abdomen. You have a fever or chills. You have nausea or vomiting. Summary A urinary tract infection (UTI) is an infection of any part of the urinary tract, which includes the kidneys, ureters, bladder, and urethra. Most urinary tract infections are caused by bacteria in your genital area. Treatment for this condition often includes antibiotic medicines. If you were prescribed an antibiotic medicine, take it as told by your health care provider. Do not stop using the antibiotic even if you start to feel better. Keep all follow-up visits. This is important. This information is not intended to replace advice given to you by your health care provider. Make sure you discuss any questions you have with your health care provider. Document Revised: 06/02/2020 Document Reviewed: 06/07/2020 [employer] Patient Education  2024 [employer]  

## 2023-06-14 NOTE — Progress Notes (Signed)
Subjective:    [name], female    DOB: October 15, 1956, 67 y.o.   MRN: [id number]  Chief Complaint  Patient presents with   Dysuria    Dysuria      HPI Discussed the use of AI scribe software for clinical note transcription with the patient, who gave verbal consent to proceed.  History of Present Illness   The patient, with a history of plasma cell myeloma, presents with multiple symptoms including a urinary tract infection, bacterial vaginosis, and yeast infection. She reports diarrhea, which she attributes to her myeloma treatment, and describes feeling weak and lacking energy. The patient also mentions sleep disturbances, which are contributing to her overall fatigue.  She expresses anxiety about these procedures, particularly the bone marrow biopsy. The patient's family member is called during the consultation and will be accompanying the patient during her upcoming procedures.  The patient also mentions a planned cruise trip in the near future, expressing concern about potential motion sickness.  She is very tearful about her ongoing and worsening health concerns. Oncology has notified her that her disease is progressing and she needs a Port, a bone scan and a bone biopsy, then her new treatment regimen starts 9/6.       Past Medical History:  Diagnosis Date   Abnormal SPEP 07/26/2016   Arthritis    knees, hands   Back pain 07/14/2016   Bowel obstruction (HCC) 11/2014   C. 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   Iron deficiency anemia due to chronic blood loss 05/28/2022   Low back pain 07/14/2016   Monoclonal gammopathy of unknown significance (MGUS)  09/16/2016   Multiple myeloma (HCC) 07/20/2018   Multiple myeloma not having achieved remission (HCC) 07/20/2018   Myalgia 12/31/2016   Obstructive sleep apnea 03/31/2015   Presence of permanent cardiac pacemaker    SOB (shortness of breath) 04/09/2016   Stroke (HCC)    TIAs   UTI (urinary tract infection)     Past Surgical History:  Procedure Laterality Date   ABDOMINAL HYSTERECTOMY     menorraghia, 2006, total   BIOPSY  09/01/2022   Procedure: BIOPSY;  Surgeon: Lynann Bologna, MD;  Location: [facility];  Service: Gastroenterology;;   CHOLECYSTECTOMY     COLONOSCOPY  2018   [facility] per patient   COLONOSCOPY WITH PROPOFOL N/A 09/01/2022   Procedure: COLONOSCOPY WITH PROPOFOL;  Surgeon: Lynann Bologna, MD;  Location: [facility];  Service: Gastroenterology;  Laterality: N/A;   ESOPHAGOGASTRODUODENOSCOPY  2018   [facility]    ESOPHAGOGASTRODUODENOSCOPY (EGD) WITH PROPOFOL N/A 09/01/2022   Procedure: ESOPHAGOGASTRODUODENOSCOPY (EGD) WITH PROPOFOL;  Surgeon: Lynann Bologna, MD;  Location: [facility];  Service: Gastroenterology;  Laterality: N/A;   KNEE SURGERY     right, repair torn torn cartialage   PACEMAKER INSERTION  08/2014   TONSILLECTOMY     TUBAL LIGATION      Family History  Problem Relation Age of Onset   Diabetes Mother    Hypertension Mother    Heart disease Mother        s/p 1 stent   Hyperlipidemia Mother    Arthritis  Mother    Cancer Father        COLON   Colon cancer Father 22   Irritable bowel syndrome Sister    Hyperlipidemia Daughter    Hypertension Daughter    Hypertension Maternal Grandmother    Arthritis Maternal Grandmother    Heart disease Maternal Grandfather        MI   Hypertension Maternal Grandfather    Arthritis Maternal Grandfather    Hypertension Son    Esophageal cancer Neg Hx     Social History   Socioeconomic History   Marital status: Divorced    Spouse name: [name]   Number of children: 3    Years of education: [name]   Highest education level: High school graduate  Occupational History   [name]  Tobacco Use   Smoking status: Never    Passive exposure: Never   Smokeless tobacco: Never  Vaping Use   Vaping status: Never Used  Substance and Sexual Activity   Alcohol use: No   Drug use: No   Sexual activity: Not Currently  Other Topics Concern   [name]  Social History Narrative   Lives at home alone   Retired   Caffeine: coffee   Social Determinants of Health   Financial Resource Strain: Low Risk  (10/17/2021)   Overall Financial Resource Strain (CARDIA)    Difficulty of Paying Living Expenses: Not hard at all  Food Insecurity: No Food Insecurity (03/28/2023)   Hunger Vital Sign    Worried About Running Out of Food in the Last Year: Never true    Ran Out of Food in the Last Year: Never true  Transportation Needs: No Transportation Needs (03/28/2023)   PRAPARE - Administrator, Civil Service (Medical): No    Lack of Transportation (Non-Medical): No  Physical Activity: Insufficiently Active (10/17/2021)   Exercise Vital Sign    Days of Exercise per Week: 2 days    Minutes of Exercise per Session: 30 min  Stress: No Stress Concern Present (01/21/2023)   [employer] of Occupational Health - Occupational Stress Questionnaire    Feeling of Stress : Not at all  Social Connections: Unknown (03/20/2022)   Received from [facility]   Social Network    Social Network: [name]  Intimate Partner Violence: Not At Risk (03/28/2023)   Humiliation, Afraid, Rape, and Kick questionnaire    Fear of Current or Ex-Partner: No    Emotionally Abused: No    Physically Abused: No    Sexually Abused: No    Outpatient Medications Prior to Visit  Medication Sig Dispense Refill   acyclovir (ZOVIRAX) 400 MG tablet Take 1 tablet (400 mg total) by mouth 2 (two) times daily. 60 tablet 3   albuterol (PROVENTIL) (2.5 MG/3ML) 0.083% nebulizer solution  Take 3 mLs (2.5 mg total) by nebulization every 6 (six) hours as needed for wheezing or shortness of breath. 150 mL 2   albuterol (VENTOLIN HFA) 108 (90 Base) MCG/ACT inhaler Inhale 2 puffs into the lungs every 6 (six) hours as needed for wheezing or shortness of breath. 18 g 5   amLODipine (NORVASC) 5 MG tablet Take 1 tablet (5 mg total) by mouth daily. 30 tablet 4   aspirin EC 81 MG tablet Take 81 mg by mouth daily. Swallow whole.     budesonide (PULMICORT) 0.5 MG/2ML nebulizer solution Take 2 mLs (0.5 mg total) by nebulization 2 (two) times daily as needed (cough,  wheezing or shortness of breath). 120 mL 11   cetirizine (ZYRTEC) 10 MG tablet Take 10 mg by mouth at bedtime.     Cholecalciferol (VITAMIN D3) 50 MCG (2000 UT) TABS Take 2,000 Units by mouth in the morning.     cholestyramine light (PREVALITE) 4 g packet Take 4 g by mouth 2 (two) times daily.     dicyclomine (BENTYL) 10 MG capsule Take 1 capsule (10 mg total) by mouth 4 (four) times daily -  before meals and at bedtime. (Patient taking differently: Take 10 mg by mouth 4 (four) times daily -  before meals and at bedtime. cramping) 120 capsule 2   diphenoxylate-atropine (LOMOTIL) 2.5-0.025 MG tablet Take 1 tablet by mouth 2 (two) times daily. 60 tablet 6   fluticasone (FLONASE) 50 MCG/ACT nasal spray Place 2 sprays into both nostrils daily. 16 g 0   furosemide (LASIX) 20 MG tablet Take 20 mg by mouth daily as needed (fluid retention (feet swelling)).     hydrOXYzine (ATARAX) 10 MG tablet TAKE ONE (1) TABLET BY MOUTH 3 TIMES DAILY AS NEEDED FOR ITCHING 30 tablet 2   hyoscyamine (LEVSIN SL) 0.125 MG SL tablet Place 1 tablet (0.125 mg total) under the tongue every 4 (four) hours as needed. 30 tablet 1   loperamide (IMODIUM) 2 MG capsule Take 4 mg by mouth in the morning and at bedtime.     losartan (COZAAR) 50 MG tablet Take 50 mg by mouth in the morning and at bedtime.     Magnesium Glycinate 100 MG CAPS Take 200 mg by mouth at bedtime.      meclizine (ANTIVERT) 12.5 MG tablet Take 12.5 mg by mouth 2 (two) times daily as needed for dizziness or nausea.     meloxicam (MOBIC) 15 MG tablet Take 15 mg by mouth daily.     metoprolol succinate (TOPROL-XL) 50 MG 24 hr tablet Take 1 tablet (50 mg total) by mouth 2 (two) times daily. (Patient taking differently: Take 50 mg by mouth daily.) 60 tablet 2   Multiple Vitamin (QUINTABS) TABS Take 1 tablet by mouth daily.     oxyCODONE-acetaminophen (PERCOCET/ROXICET) 5-325 MG tablet Take 1 tablet by mouth every 6 (six) hours as needed for severe pain.     potassium chloride SA (KLOR-CON M) 20 MEQ tablet Take 1 tablet (20 mEq total) by mouth daily. 90 tablet 1   Probiotic Product (ALIGN) 4 MG CAPS Take 1 capsule (4 mg total) by mouth daily. 30 capsule 5   prochlorperazine (COMPAZINE) 10 MG tablet Take 0.5 tablets (5 mg total) by mouth 2 (two) times daily as needed for nausea or vomiting. 60 tablet 1   spironolactone (ALDACTONE) 25 MG tablet Take 12.5 mg by mouth daily.     nortriptyline (PAMELOR) 10 MG capsule Take 1 capsule (10 mg total) by mouth at bedtime. 30 capsule 1   Facility-Administered Medications Prior to Visit  Medication Dose Route Frequency Provider Last Rate Last Admin   nitroGLYCERIN (NITROSTAT) SL tablet 0.4 mg  0.4 mg Sublingual Once [name], MD        Allergies  Allergen Reactions   Benazepril Anaphylaxis, Swelling and Hives    angioedema Throat and lip swelling   Ondansetron Hcl Hives    Redness and hives post IV admin on 07/05/17   Codeine Nausea And Vomiting   Morphine Hives    Redness and hives noted post IV admin on 07/05/17    Review of Systems  Constitutional:  Negative  for fever and malaise/fatigue.  HENT:  Negative for congestion.   Eyes:  Negative for blurred vision.  Respiratory:  Negative for shortness of breath.   Cardiovascular:  Negative for chest pain, palpitations and leg swelling.  Gastrointestinal:  Positive for abdominal pain. Negative  for blood in stool and nausea.  Genitourinary:  Positive for frequency. Negative for dysuria.  Musculoskeletal:  Positive for back pain. Negative for falls.  Skin:  Negative for rash.  Neurological:  Negative for dizziness, loss of consciousness and headaches.  Endo/Heme/Allergies:  Negative for environmental allergies.  Psychiatric/Behavioral:  Positive for depression. Negative for substance abuse and suicidal ideas. The patient has insomnia. The patient is not nervous/anxious.        Objective:    Physical Exam Constitutional:      General: She is not in acute distress.    Appearance: Normal appearance. She is well-developed. She is not toxic-appearing.  HENT:     Head: Normocephalic and atraumatic.     Right Ear: External ear normal.     Left Ear: External ear normal.     Nose: Nose normal.  Eyes:     General:        Right eye: No discharge.        Left eye: No discharge.     Conjunctiva/sclera: Conjunctivae normal.  Neck:     Thyroid: No thyromegaly.  Cardiovascular:     Rate and Rhythm: Normal rate and regular rhythm.     Heart sounds: Normal heart sounds. No murmur heard. Pulmonary:     Effort: Pulmonary effort is normal. No respiratory distress.     Breath sounds: Normal breath sounds.  Abdominal:     General: Bowel sounds are normal.     Palpations: Abdomen is soft.     Tenderness: There is no abdominal tenderness. There is no guarding.  Musculoskeletal:        General: Normal range of motion.     Cervical back: Neck supple.  Lymphadenopathy:     Cervical: No cervical adenopathy.  Skin:    General: Skin is warm and dry.  Neurological:     Mental Status: She is alert and oriented to person, place, and time.  Psychiatric:        Mood and Affect: Mood normal.        Behavior: Behavior normal.        Thought Content: Thought content normal.        Judgment: Judgment normal.     Comments: sad     BP 130/82 (BP Location: Left Arm, Patient Position: Sitting,  Cuff Size: Normal)   Pulse 87   Temp 98 F (36.7 C) (Oral)   Resp 16   Ht 5\' 6"  (1.676 m)   Wt 189 lb 12.8 oz (86.1 kg)   SpO2 98%   BMI 30.63 kg/m  Wt Readings from Last 3 Encounters:  06/14/23 189 lb 12.8 oz (86.1 kg)  06/11/23 191 lb (86.6 kg)  05/28/23 191 lb (86.6 kg)    Diabetic Foot Exam - Simple   No data filed    Lab Results  Component Value Date   WBC 15.3 (H) 05/23/2023   HGB 11.5 (L) 05/23/2023   HCT 35.2 (L) 05/23/2023   PLT 105 (L) 05/23/2023   GLUCOSE 113 (H) 05/23/2023   CHOL 167 03/16/2023   TRIG 118.0 03/16/2023   HDL 49.20 03/16/2023   LDLCALC 95 03/16/2023   ALT 15 05/21/2023   AST 30 05/21/2023  NA 137 05/23/2023   K 3.4 (L) 05/23/2023   CL 106 05/23/2023   CREATININE 0.70 05/23/2023   BUN 15 05/23/2023   CO2 21 (L) 05/23/2023   TSH 1.34 03/16/2023   INR 0.90 07/28/2018   HGBA1C 5.7 03/16/2023    Lab Results  Component Value Date   TSH 1.34 03/16/2023   Lab Results  Component Value Date   WBC 15.3 (H) 05/23/2023   HGB 11.5 (L) 05/23/2023   HCT 35.2 (L) 05/23/2023   MCV 88.2 05/23/2023   PLT 105 (L) 05/23/2023   Lab Results  Component Value Date   NA 137 05/23/2023   K 3.4 (L) 05/23/2023   CHLORIDE 108 12/31/2016   CO2 21 (L) 05/23/2023   GLUCOSE 113 (H) 05/23/2023   BUN 15 05/23/2023   CREATININE 0.70 05/23/2023   BILITOT 0.5 05/21/2023   ALKPHOS 45 05/21/2023   AST 30 05/21/2023   ALT 15 05/21/2023   PROT 9.5 (H) 05/21/2023   ALBUMIN 3.8 05/21/2023   CALCIUM 9.1 05/23/2023   ANIONGAP 10 05/23/2023   EGFR >90 12/31/2016   GFR 92.36 03/16/2023   Lab Results  Component Value Date   CHOL 167 03/16/2023   Lab Results  Component Value Date   HDL 49.20 03/16/2023   Lab Results  Component Value Date   LDLCALC 95 03/16/2023   Lab Results  Component Value Date   TRIG 118.0 03/16/2023   Lab Results  Component Value Date   CHOLHDL 3 03/16/2023   Lab Results  Component Value Date   HGBA1C 5.7 03/16/2023        Assessment & Plan:  Hypomagnesemia Assessment & Plan: Supplement and monitor   Orders: -     Magnesium  Multiple myeloma not having achieved remission (HCC) Assessment & Plan: Following closely with oncology   Urinary frequency Assessment & Plan: Check UA and culture  Orders: -     POCT Urinalysis Dipstick (Automated) -     Cervicovaginal ancillary only -     Urine Culture -     Urinalysis  Chronic abdominal pain -     Nortriptyline HCl; Take 1 capsule (10 mg total) by mouth at bedtime.  Dispense: 30 capsule; Refill: 1  Hyperglycemia -     Hemoglobin A1c  Primary hypertension -     CBC with Differential/Platelet -     Comprehensive metabolic panel -     TSH  Hyperlipidemia, unspecified hyperlipidemia type -     Lipid panel  Dysuria -     Urine Culture -     Urinalysis  Other orders -     Amoxicillin-Pot Clavulanate; Take 1 tablet by mouth 2 (two) times daily.  Dispense: 6 tablet; Refill: 0 -     Fluconazole; Take 1 tablet (150 mg total) by mouth once a week.  Dispense: 2 tablet; Refill: 1 -     ALPRAZolam; Take 1-2 tablets (0.25-0.5 mg total) by mouth 3 (three) times daily as needed for anxiety.  Dispense: 40 tablet; Refill: 1 -     Sertraline HCl; Take 1 tablet (50 mg total) by mouth daily.  Dispense: 30 tablet; Refill: 3 -     Scopolamine; Place 1 patch (1.5 mg total) onto the skin every 3 (three) days.  Dispense: 2 patch; Refill: 0    Assessment and Plan    Urinary Tract Infection Persistent symptoms of abdominal and back pain. Previous culture showed E. coli resistant to multiple antibiotics. -Start Augmentin for 3 days. -  Collect urine culture today to reassess.  Vaginal Yeast Infection Recent history of yeast infection and bacterial vaginosis. Currently on treatment. -Continue current treatment. -Administer Diflucan on the last day of antibiotic treatment or the day after. If symptoms of itching and white discharge occur, take Diflucan on the second  day of antibiotic treatment.  Anxiety and Insomnia Reports of stress and difficulty sleeping due to current health condition. -Start Xanax (alprazolam) as needed for anxiety. -Start nortriptyline for sleep. -Consider starting a daily antidepressant such as Zoloft for long-term management of stress and anxiety, a prescription for Sertraline 50 mg tabs 1 daily if she decides to start she will hold off on trying Nortriptyline for a week or two. if still not sleeping then she can try  Motion Sickness Anticipating a cruise trip and concerned about potential motion sickness. -Prescribe scopolamine patches for use during the cruise.  General Health Maintenance -Check potassium and magnesium levels due to recent diarrhea. -Check hemoglobin and thyroid function. -Consider starting a daily antidepressant such as Zoloft for long-term management of stress and anxiety. -Schedule follow-up appointment in 3-4 weeks to reassess.         Danise Edge, MD

## 2023-06-15 ENCOUNTER — Telehealth: Payer: Self-pay | Admitting: Family Medicine

## 2023-06-15 NOTE — Telephone Encounter (Signed)
Called pt was advised of results.

## 2023-06-15 NOTE — Telephone Encounter (Signed)
Please call to advise of the results of labs. Pt said she was expecting to hear back by now.

## 2023-06-16 ENCOUNTER — Other Ambulatory Visit: Payer: Self-pay

## 2023-06-16 ENCOUNTER — Other Ambulatory Visit: Payer: Self-pay | Admitting: *Deleted

## 2023-06-16 MED ORDER — METRONIDAZOLE 500 MG PO TABS: 500.0000 mg | ORAL_TABLET | Freq: Two times a day (BID) | ORAL | 0 refills | Status: AC

## 2023-06-17 ENCOUNTER — Other Ambulatory Visit: Payer: Self-pay

## 2023-06-17 ENCOUNTER — Telehealth: Payer: Self-pay | Admitting: Family Medicine

## 2023-06-17 NOTE — Telephone Encounter (Signed)
Pt's healthcare proxy, [name] called and requested a call back from Dr. Abner Greenspan. She did not elaborate on why. Just that she has a question. Her callback number [phone].

## 2023-06-18 ENCOUNTER — Telehealth: Payer: Self-pay | Admitting: Family Medicine

## 2023-06-18 ENCOUNTER — Other Ambulatory Visit (HOSPITAL_COMMUNITY): Payer: 59

## 2023-06-18 ENCOUNTER — Other Ambulatory Visit: Payer: Self-pay

## 2023-06-18 ENCOUNTER — Emergency Department (HOSPITAL_BASED_OUTPATIENT_CLINIC_OR_DEPARTMENT_OTHER)
Admission: EM | Admit: 2023-06-18 | Discharge: 2023-06-18 | Disposition: A | Discharge status: Home / Self Care | Payer: 59 | Attending: Emergency Medicine | Admitting: Emergency Medicine

## 2023-06-18 DIAGNOSIS — Z7982 Long term (current) use of aspirin: Secondary | ICD-10-CM | POA: Insufficient documentation

## 2023-06-18 DIAGNOSIS — K529 Noninfective gastroenteritis and colitis, unspecified: Secondary | ICD-10-CM

## 2023-06-18 DIAGNOSIS — R030 Elevated blood-pressure reading, without diagnosis of hypertension: Secondary | ICD-10-CM

## 2023-06-18 DIAGNOSIS — Z79899 Other long term (current) drug therapy: Secondary | ICD-10-CM | POA: Diagnosis not present

## 2023-06-18 DIAGNOSIS — I10 Essential (primary) hypertension: Secondary | ICD-10-CM

## 2023-06-18 DIAGNOSIS — C9000 Multiple myeloma not having achieved remission: Secondary | ICD-10-CM | POA: Diagnosis not present

## 2023-06-18 DIAGNOSIS — D696 Thrombocytopenia, unspecified: Secondary | ICD-10-CM | POA: Diagnosis not present

## 2023-06-18 DIAGNOSIS — R197 Diarrhea, unspecified: Secondary | ICD-10-CM | POA: Insufficient documentation

## 2023-06-18 DIAGNOSIS — Z8639 Personal history of other endocrine, nutritional and metabolic disease: Secondary | ICD-10-CM | POA: Diagnosis not present

## 2023-06-18 DIAGNOSIS — R531 Weakness: Secondary | ICD-10-CM | POA: Insufficient documentation

## 2023-06-18 LAB — COMPREHENSIVE METABOLIC PANEL
ALT: 18 U/L (ref 0–44)
AST: 25 U/L (ref 15–41)
Albumin: 3.8 g/dL (ref 3.5–5.0)
Alkaline Phosphatase: 46 U/L (ref 38–126)
Anion gap: 6 (ref 5–15)
BUN: 19 mg/dL (ref 8–23)
CO2: 27 mmol/L (ref 22–32)
Calcium: 9.7 mg/dL (ref 8.9–10.3)
Chloride: 105 mmol/L (ref 98–111)
Creatinine, Ser: 0.68 mg/dL (ref 0.44–1.00)
GFR, Estimated: >60 mL/min (ref >60)
Glucose, Bld: 95 mg/dL (ref 70–99)
Potassium: 3.7 mmol/L (ref 3.5–5.1)
Sodium: 138 mmol/L (ref 135–145)
Total Bilirubin: 0.4 mg/dL (ref 0.3–1.2)
Total Protein: 9.7 g/dL — ABNORMAL HIGH (ref 6.5–8.1)

## 2023-06-18 LAB — CBC
HCT: 36.7 % (ref 36.0–46.0)
Hemoglobin: 11.7 g/dL — ABNORMAL LOW (ref 12.0–15.0)
MCH: 28.5 pg (ref 26.0–34.0)
MCHC: 31.9 g/dL (ref 30.0–36.0)
MCV: 89.3 fL (ref 80.0–100.0)
Platelets: 138 10*3/uL — ABNORMAL LOW (ref 150–400)
RBC: 4.11 MIL/uL (ref 3.87–5.11)
RDW: 14 % (ref 11.5–15.5)
WBC: 2.3 10*3/uL — ABNORMAL LOW (ref 4.0–10.5)
nRBC: 0 % (ref 0.0–0.2)

## 2023-06-18 LAB — MAGNESIUM: Magnesium: 1.7 mg/dL (ref 1.7–2.4)

## 2023-06-18 MED ORDER — LACTATED RINGERS IV BOLUS
1000.0000 mL | Freq: Once | INTRAVENOUS | Status: AC
  Administered 2023-06-18: 1000 mL via INTRAVENOUS

## 2023-06-18 NOTE — ED Notes (Signed)
IV attempt x1 left AC, unsuccessful.  Blood obtained will take to lab.

## 2023-06-18 NOTE — ED Triage Notes (Signed)
Pt reports having Diarrhea x3 days. Complains of weakness.  Also reports having untreated UTI and yeast infection.

## 2023-06-18 NOTE — Telephone Encounter (Signed)
Pt came in office stating was seen on Monday for UTI and provider prescribed pt with amoxicillin-clavulanate (AUGMENTIN) 875-125 MG tablet, pt states is completely out of meds and still having not better, pt would like to know if possible to get refill of Amoxicillin sent to [facility] - [place], [place] - [address], [place] [address] Phone: [phone]  Fax: [phone]. Please advise.

## 2023-06-18 NOTE — Telephone Encounter (Signed)
Please see result note 

## 2023-06-18 NOTE — ED Provider Notes (Signed)
[place] [facility] Provider Note   CSN: [id number] Arrival date & time: 06/18/23  8295     History  Chief Complaint  Patient presents with   Diarrhea    Tami [name] is a 67 y.o. female.  Pt c/o generally feeling weak. States recently saw pcp and was dx with uti, has taken antibiotic. No current dysuria or flank pain. No fever or chills. Indicates recently generally poor po intake. Indicates has issues with chronic diarrhea/loose stools - denies acute or abrupt worsening since antibiotic therapy. No abd pain. No vomiting. No fever or chills. No chest pain or discomfort. No sob or unusual doe. Pt indicates was told her electrolytes were low and may need fluids.   The history is provided by the patient and medical records.  Diarrhea Associated symptoms: no abdominal pain, no chills, no fever, no headaches and no vomiting        Home Medications Prior to Admission medications   Medication Sig Start Date End Date Taking? Authorizing Provider  acyclovir (ZOVIRAX) 400 MG tablet Take 1 tablet (400 mg total) by mouth 2 (two) times daily. 04/22/23   Josph Macho, MD  albuterol (PROVENTIL) (2.5 MG/3ML) 0.083% nebulizer solution Take 3 mLs (2.5 mg total) by nebulization every 6 (six) hours as needed for wheezing or shortness of breath. 12/15/22   [name], MD  albuterol (VENTOLIN HFA) 108 (90 Base) MCG/ACT inhaler Inhale 2 puffs into the lungs every 6 (six) hours as needed for wheezing or shortness of breath. 12/07/22   [name], MD  ALPRAZolam Prudy Feeler) 0.25 MG tablet Take 1-2 tablets (0.25-0.5 mg total) by mouth 3 (three) times daily as needed for anxiety. 06/14/23   [name], MD  amLODipine (NORVASC) 5 MG tablet Take 1 tablet (5 mg total) by mouth daily. 03/16/23   [name], MD  amoxicillin-clavulanate (AUGMENTIN) 875-125 MG tablet Take 1 tablet by mouth 2 (two) times daily. 06/14/23   [name], MD  aspirin EC 81 MG tablet  Take 81 mg by mouth daily. Swallow whole.    [provider]  budesonide (PULMICORT) 0.5 MG/2ML nebulizer solution Take 2 mLs (0.5 mg total) by nebulization 2 (two) times daily as needed (cough, wheezing or shortness of breath). 12/31/22   Martina Sinner, MD  cetirizine (ZYRTEC) 10 MG tablet Take 10 mg by mouth at bedtime.    [provider]  Cholecalciferol (VITAMIN D3) 50 MCG (2000 UT) TABS Take 2,000 Units by mouth in the morning.    [provider]  cholestyramine light (PREVALITE) 4 g packet Take 4 g by mouth 2 (two) times daily.    [provider]  dicyclomine (BENTYL) 10 MG capsule Take 1 capsule (10 mg total) by mouth 4 (four) times daily -  before meals and at bedtime. Patient taking differently: Take 10 mg by mouth 4 (four) times daily -  before meals and at bedtime. cramping 01/25/23 04/25/23  Judyann Munson, MD  diphenoxylate-atropine (LOMOTIL) 2.5-0.025 MG tablet Take 1 tablet by mouth 2 (two) times daily. 05/19/23   Lynann Bologna, MD  fluconazole (DIFLUCAN) 150 MG tablet Take 1 tablet (150 mg total) by mouth once a week. 06/14/23   [name], MD  fluticasone (FLONASE) 50 MCG/ACT nasal spray Place 2 sprays into both nostrils daily. 01/07/20   Joy, Shawn C, PA-C  furosemide (LASIX) 20 MG tablet Take 20 mg by mouth daily as needed (fluid retention (feet  swelling)). 07/03/20   [provider]  hydrOXYzine (ATARAX) 10 MG tablet TAKE ONE (1) TABLET BY MOUTH 3 TIMES DAILY AS NEEDED FOR ITCHING 03/03/23   [name], MD  hyoscyamine (LEVSIN SL) 0.125 MG SL tablet Place 1 tablet (0.125 mg total) under the tongue every 4 (four) hours as needed. 04/27/23   [name], MD  loperamide (IMODIUM) 2 MG capsule Take 4 mg by mouth in the morning and at bedtime.    [provider]  losartan (COZAAR) 50 MG tablet Take 50 mg by mouth in the morning and at bedtime. 04/14/21   [provider]  Magnesium Glycinate 100 MG CAPS Take 200 mg by  mouth at bedtime. 09/21/22   [provider]  meclizine (ANTIVERT) 12.5 MG tablet Take 12.5 mg by mouth 2 (two) times daily as needed for dizziness or nausea. 01/28/22   [provider]  meloxicam (MOBIC) 15 MG tablet Take 15 mg by mouth daily. 12/31/22   [provider]  metoprolol succinate (TOPROL-XL) 50 MG 24 hr tablet Take 1 tablet (50 mg total) by mouth 2 (two) times daily. Patient taking differently: Take 50 mg by mouth daily. 02/23/23   [name], MD  metroNIDAZOLE (FLAGYL) 500 MG tablet Take 1 tablet (500 mg total) by mouth 2 (two) times daily for 7 days. 06/16/23 06/23/23  [name], MD  Multiple Vitamin Brandt Loosen) TABS Take 1 tablet by mouth daily. 11/06/21   [provider]  nortriptyline (PAMELOR) 10 MG capsule Take 1 capsule (10 mg total) by mouth at bedtime. 06/14/23   [name], MD  oxyCODONE-acetaminophen (PERCOCET/ROXICET) 5-325 MG tablet Take 1 tablet by mouth every 6 (six) hours as needed for severe pain. 11/07/21   [provider]  potassium chloride SA (KLOR-CON M) 20 MEQ tablet Take 1 tablet (20 mEq total) by mouth daily. 01/15/23   [name], MD  Probiotic Product (ALIGN) 4 MG CAPS Take 1 capsule (4 mg total) by mouth daily. 03/16/23   [name], MD  prochlorperazine (COMPAZINE) 10 MG tablet Take 0.5 tablets (5 mg total) by mouth 2 (two) times daily as needed for nausea or vomiting. 03/16/23   [name], MD  scopolamine (TRANSDERM-SCOP) 1 MG/3DAYS Place 1 patch (1.5 mg total) onto the skin every 3 (three) days. 06/14/23   [name], MD  sertraline (ZOLOFT) 50 MG tablet Take 1 tablet (50 mg total) by mouth daily. 06/14/23   [name], MD  spironolactone (ALDACTONE) 25 MG tablet Take 12.5 mg by mouth daily.    [provider]      Allergies    Benazepril, Ondansetron hcl, Codeine, and Morphine    Review of Systems   Review of Systems  Constitutional:  Negative for chills and fever.  HENT:   Negative for sore throat.   Eyes:  Negative for redness.  Respiratory:  Negative for cough and shortness of breath.   Cardiovascular:  Negative for chest pain.  Gastrointestinal:  Positive for diarrhea and nausea. Negative for abdominal pain and vomiting.  Genitourinary:  Negative for dysuria and flank pain.  Musculoskeletal:  Negative for back pain and neck pain.  Skin:  Negative for rash.  Neurological:  Negative for headaches.  Psychiatric/Behavioral:  Negative for confusion.     Physical Exam Updated Vital Signs BP (!) 187/95   Pulse 74   Temp 97.8 F (36.6 C)   Resp (!) 32   SpO2 95%  Physical Exam Vitals and nursing note reviewed.  Constitutional:      Appearance: Normal appearance. She is well-developed.  HENT:     Head: Atraumatic.     Nose: Nose normal.     Mouth/Throat:     Mouth: Mucous membranes are moist.  Eyes:     General: No scleral icterus.    Conjunctiva/sclera: Conjunctivae normal.  Neck:     Trachea: No tracheal deviation.  Cardiovascular:     Rate and Rhythm: Normal rate and regular rhythm.     Pulses: Normal pulses.     Heart sounds: Normal heart sounds. No murmur heard.    No friction rub. No gallop.  Pulmonary:     Effort: Pulmonary effort is normal. No respiratory distress.     Breath sounds: Normal breath sounds.  Abdominal:     General: Bowel sounds are normal. There is no distension.     Palpations: Abdomen is soft.     Tenderness: There is no abdominal tenderness. There is no guarding.  Genitourinary:    Comments: No cva tenderness.  Musculoskeletal:        General: No swelling or tenderness.     Cervical back: Normal range of motion and neck supple. No rigidity. No muscular tenderness.  Skin:    General: Skin is warm and dry.     Findings: No rash.  Neurological:     Mental Status: She is alert.     Comments: Alert, speech normal. Motor/sens grossly intact bil.   Psychiatric:        Mood and Affect: Mood normal.     ED  Results / Procedures / Treatments   Labs (all labs ordered are listed, but only abnormal results are displayed) Results for orders placed or performed during the hospital encounter of 06/18/23  CBC  Result Value Ref Range   WBC 2.3 (L) 4.0 - 10.5 K/uL   RBC 4.11 3.87 - 5.11 MIL/uL   Hemoglobin 11.7 (L) 12.0 - 15.0 g/dL   HCT 95.2 84.1 - 32.4 %   MCV 89.3 80.0 - 100.0 fL   MCH 28.5 26.0 - 34.0 pg   MCHC 31.9 30.0 - 36.0 g/dL   RDW 40.1 02.7 - 25.3 %   Platelets 138 (L) 150 - 400 K/uL   nRBC 0.0 0.0 - 0.2 %  Comprehensive metabolic panel  Result Value Ref Range   Sodium 138 135 - 145 mmol/L   Potassium 3.7 3.5 - 5.1 mmol/L   Chloride 105 98 - 111 mmol/L   CO2 27 22 - 32 mmol/L   Glucose, Bld 95 70 - 99 mg/dL   BUN 19 8 - 23 mg/dL   Creatinine, Ser 6.64 0.44 - 1.00 mg/dL   Calcium 9.7 8.9 - 40.3 mg/dL   Total Protein 9.7 (H) 6.5 - 8.1 g/dL   Albumin 3.8 3.5 - 5.0 g/dL   AST 25 15 - 41 U/L   ALT 18 0 - 44 U/L   Alkaline Phosphatase 46 38 - 126 U/L   Total Bilirubin 0.4 0.3 - 1.2 mg/dL   GFR, Estimated >47 >42 mL/min   Anion gap 6 5 - 15  Magnesium  Result Value Ref Range   Magnesium 1.7 1.7 - 2.4 mg/dL   *Note: Due to a large number of results and/or encounters for the requested time period, some results have not been displayed. A complete set of results can be found in Results Review.     ED ECG REPORT  Date: 06/18/2023  Rate: 82  Rhythm: atrial sensed, ventricular paced rhythm.   QRS Axis: normal  Intervals: normal  ST/T Wave abnormalities: nonspecific ST/T changes  Conduction Disutrbances:none  Narrative Interpretation:   Old EKG Reviewed: unchanged  I have personally reviewed the EKG tracing    Radiology No results found.  Procedures Procedures    Medications Ordered in ED Medications  lactated ringers bolus 1,000 mL (0 mLs Intravenous Stopped 06/18/23 1110)    ED Course/ Medical Decision Making/ A&P                                 Medical  Decision Making Problems Addressed: Chronic diarrhea: chronic illness or injury Elevated blood pressure reading: acute illness or injury Essential hypertension: chronic illness or injury with exacerbation, progression, or side effects of treatment that poses a threat to life or bodily functions Generalized weakness: acute illness or injury with systemic symptoms that poses a threat to life or bodily functions Thrombocytopenia (HCC): chronic illness or injury  Amount and/or Complexity of Data Reviewed External Data Reviewed: notes. Labs: ordered. Decision-making details documented in ED Course. ECG/medicine tests: ordered and independent interpretation performed. Decision-making details documented in ED Course.  Risk Decision regarding hospitalization.  Iv ns. Continuous pulse ox and cardiac monitoring. Labs ordered/sent.   Differential diagnosis includes dehydration, aki, hypokalemia, etc. Dispo decision including potential need for admission considered - will get labs and reassess.   Reviewed nursing notes and prior charts for additional history. External reports reviewed.   Cardiac monitor: paced rhythm, rate 80.   LR bolus.   Labs reviewed/interpreted by me - hct normal. Wbc and plt similar to prior. Lytes normal.   Recheck abd soft non tender. No emesis.   Pt currently appears stable for d/c.   Rec close pcp f/u.  Return precautions provided.          Final Clinical Impression(s) / ED Diagnoses Final diagnoses:  None    Rx / DC Orders ED Discharge Orders     None         Cathren Laine, MD 06/18/23 1226

## 2023-06-18 NOTE — Discharge Instructions (Signed)
It was our pleasure to provide your ER care today - we hope that you feel better.  Drink plenty of fluids/stay well hydrated.   Follow up with primary care doctor in the next 1-2 weeks. Also have your blood pressure rechecked then as it is high today.  Return to ER if worse, new symptoms, fevers, new/severe pain, severe abdominal pain, chest pain, trouble breathing, or other concern.

## 2023-06-18 NOTE — ED Notes (Signed)
Assisted patient to restroom.

## 2023-06-20 DIAGNOSIS — Z556 Problems related to health literacy: Secondary | ICD-10-CM | POA: Diagnosis not present

## 2023-06-20 DIAGNOSIS — K529 Noninfective gastroenteritis and colitis, unspecified: Secondary | ICD-10-CM | POA: Diagnosis not present

## 2023-06-21 ENCOUNTER — Other Ambulatory Visit: Payer: Self-pay

## 2023-06-21 ENCOUNTER — Telehealth: Payer: Self-pay

## 2023-06-21 ENCOUNTER — Encounter: Payer: Self-pay | Admitting: Hematology & Oncology

## 2023-06-21 DIAGNOSIS — I5042 Chronic combined systolic (congestive) and diastolic (congestive) heart failure: Secondary | ICD-10-CM | POA: Diagnosis not present

## 2023-06-21 DIAGNOSIS — C9000 Multiple myeloma not having achieved remission: Secondary | ICD-10-CM

## 2023-06-21 NOTE — Telephone Encounter (Signed)
Patient called our office and spoke with Amber in registration. Pt informed Amber that she was in the ED over the weekend and was instructed to contact our office to have her iron rechecked this week.   Upon review of ED visits at [facility] and [facility] over the weekend, appears that pt was dx with hypomagnesemia. Per Dr Myna Hidalgo, bring pt in this week to redraw magnesium, along with iron studies d/t pt's reports of weakness and to see either ML.   Message to scheduling to contact pt for lab/ML/possible IVF. dph

## 2023-06-21 NOTE — Telephone Encounter (Signed)
Patient's healthcare proxy Mrs. Muchinson called to report patient is having low to mid back pain that started 8/8 and she is wondering if "an xray is needed to rule out a fracture"  She was advised more than likely patient will need evaluation for this even though she had a recent visit cause this is a new problem. She wanted message sent to provider.  Please advise.  No other information was given, no reported injuries.

## 2023-06-24 ENCOUNTER — Inpatient Hospital Stay: Payer: 59

## 2023-06-24 ENCOUNTER — Encounter: Payer: Self-pay | Admitting: Medical Oncology

## 2023-06-24 ENCOUNTER — Inpatient Hospital Stay (HOSPITAL_BASED_OUTPATIENT_CLINIC_OR_DEPARTMENT_OTHER): Payer: 59 | Admitting: Medical Oncology

## 2023-06-24 ENCOUNTER — Telehealth: Payer: Self-pay | Admitting: Family Medicine

## 2023-06-24 VITALS — BP 152/75 | HR 80 | Temp 98.0°F | Resp 17 | Wt 188.0 lb

## 2023-06-24 DIAGNOSIS — C9002 Multiple myeloma in relapse: Secondary | ICD-10-CM

## 2023-06-24 DIAGNOSIS — C9000 Multiple myeloma not having achieved remission: Secondary | ICD-10-CM | POA: Diagnosis not present

## 2023-06-24 DIAGNOSIS — D500 Iron deficiency anemia secondary to blood loss (chronic): Secondary | ICD-10-CM

## 2023-06-24 DIAGNOSIS — R197 Diarrhea, unspecified: Secondary | ICD-10-CM

## 2023-06-24 DIAGNOSIS — Z8639 Personal history of other endocrine, nutritional and metabolic disease: Secondary | ICD-10-CM | POA: Diagnosis not present

## 2023-06-24 LAB — CMP (CANCER CENTER ONLY)
ALT: 14 U/L (ref 0–44)
AST: 25 U/L (ref 15–41)
Albumin: 3.7 g/dL (ref 3.5–5.0)
Alkaline Phosphatase: 40 U/L (ref 38–126)
Anion gap: 9 (ref 5–15)
BUN: 20 mg/dL (ref 8–23)
CO2: 26 mmol/L (ref 22–32)
Calcium: 9.6 mg/dL (ref 8.9–10.3)
Chloride: 106 mmol/L (ref 98–111)
Creatinine: 0.83 mg/dL (ref 0.44–1.00)
GFR, Estimated: >60 mL/min (ref >60)
Glucose, Bld: 105 mg/dL — ABNORMAL HIGH (ref 70–99)
Potassium: 4 mmol/L (ref 3.5–5.1)
Sodium: 141 mmol/L (ref 135–145)
Total Bilirubin: 0.4 mg/dL (ref 0.3–1.2)
Total Protein: 9.7 g/dL — ABNORMAL HIGH (ref 6.5–8.1)

## 2023-06-24 LAB — FERRITIN: Ferritin: 288 ng/mL (ref 11–307)

## 2023-06-24 LAB — CBC WITH DIFFERENTIAL (CANCER CENTER ONLY)
Abs Immature Granulocytes: 0.01 10*3/uL (ref 0.00–0.07)
Basophils Absolute: 0 10*3/uL (ref 0.0–0.1)
Basophils Relative: 1 %
Eosinophils Absolute: 0.1 10*3/uL (ref 0.0–0.5)
Eosinophils Relative: 2 %
HCT: 39 % (ref 36.0–46.0)
Hemoglobin: 12.3 g/dL (ref 12.0–15.0)
Immature Granulocytes: 0 %
Lymphocytes Relative: 30 %
Lymphs Abs: 1 10*3/uL (ref 0.7–4.0)
MCH: 28.9 pg (ref 26.0–34.0)
MCHC: 31.5 g/dL (ref 30.0–36.0)
MCV: 91.5 fL (ref 80.0–100.0)
Monocytes Absolute: 0.3 10*3/uL (ref 0.1–1.0)
Monocytes Relative: 10 %
Neutro Abs: 1.8 10*3/uL (ref 1.7–7.7)
Neutrophils Relative %: 57 %
Platelet Count: 120 10*3/uL — ABNORMAL LOW (ref 150–400)
RBC: 4.26 MIL/uL (ref 3.87–5.11)
RDW: 14.3 % (ref 11.5–15.5)
WBC Count: 3.2 10*3/uL — ABNORMAL LOW (ref 4.0–10.5)
nRBC: 0 % (ref 0.0–0.2)

## 2023-06-24 LAB — IRON AND IRON BINDING CAPACITY (CC-WL,HP ONLY)
Iron: 86 ug/dL (ref 28–170)
Saturation Ratios: 26 % (ref 10.4–31.8)
TIBC: 335 ug/dL (ref 250–450)
UIBC: 249 ug/dL (ref 148–442)

## 2023-06-24 LAB — LACTATE DEHYDROGENASE: LDH: 183 U/L (ref 98–192)

## 2023-06-24 LAB — MAGNESIUM: Magnesium: 1.7 mg/dL (ref 1.7–2.4)

## 2023-06-24 MED ORDER — FUSION PLUS PO CAPS: 1.0000 | ORAL_CAPSULE | Freq: Every morning | ORAL | 3 refills | Status: DC

## 2023-06-24 NOTE — Progress Notes (Signed)
Hematology and Oncology Follow Up Visit  Tami [name] [id number] [birth year] 67 y.o. 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 She is here with her sister Tami [name].   She is preparing to start chemotherapy for her myeloma. PET scan is scheduled for Friday 06/25/2023. Port-a-cath placement and bone marrow biopsy on 06/29/2023.   She was seen in the ER on 06/18/2023 for weakness, chronic diarrhea and then again on 06/20/2023 for hypomagnesemia (1.6). She was given 2g of IV magnesium while in clinic. She reports that diarrhea has significantly slowed since the magnesium. She is taking oral magnesium glycinate as well.   Currently, I would say that her performance status is probably ECOG 1.     Medications:  Allergies as of 06/24/2023       Reactions   Benazepril Anaphylaxis, Swelling, Hives   angioedema Throat and lip swelling   Ondansetron Hcl Hives   Redness and hives post IV admin on 07/05/17   Codeine Nausea And Vomiting   Morphine Hives   Redness and hives noted post IV admin on 07/05/17        Medication List        Accurate as of June 24, 2023 10:00 AM. If you have any questions, ask your nurse or doctor.          acyclovir 400 MG tablet Commonly known as: ZOVIRAX Take 1 tablet (400 mg total) by mouth 2 (two) times daily.   albuterol 108 (90 Base) MCG/ACT inhaler Commonly known as: VENTOLIN HFA Inhale 2 puffs into the lungs every 6 (six) hours as needed for wheezing  or shortness of breath.   albuterol (2.5 MG/3ML) 0.083% nebulizer solution Commonly known as: PROVENTIL Take 3 mLs (2.5 mg total) by nebulization every 6 (six) hours as needed for wheezing or shortness of breath.   Align 4 MG Caps Take 1 capsule (4 mg total) by mouth daily.   ALPRAZolam 0.25 MG tablet Commonly known as: XANAX Take 1-2 tablets (0.25-0.5 mg total) by mouth 3 (three) times daily as needed for anxiety.   amLODipine 5 MG tablet Commonly known as: NORVASC Take 1 tablet (5 mg total) by mouth daily.   amoxicillin-clavulanate 875-125 MG tablet Commonly known as: AUGMENTIN Take 1 tablet by mouth 2 (two) times daily.   aspirin EC 81 MG tablet Take 81 mg by mouth daily. Swallow whole.   budesonide 0.5 MG/2ML nebulizer solution Commonly known as: Pulmicort Take 2 mLs (0.5 mg total) by nebulization 2 (two) times daily as needed (cough, wheezing or shortness of breath).   cetirizine 10 MG tablet Commonly known as: ZYRTEC Take 10 mg by mouth at bedtime.   cholestyramine light 4 g packet Commonly known as: PREVALITE Take 4 g by mouth 2 (two) times daily.   dicyclomine 10 MG capsule Commonly known as: BENTYL Take 1 capsule (10 mg total) by mouth 4 (four) times daily -  before meals and at bedtime. What changed: additional instructions   diphenoxylate-atropine 2.5-0.025  MG tablet Commonly known as: Lomotil Take 1 tablet by mouth 2 (two) times daily.   fluconazole 150 MG tablet Commonly known as: Diflucan Take 1 tablet (150 mg total) by mouth once a week.   fluticasone 50 MCG/ACT nasal spray Commonly known as: FLONASE Place 2 sprays into both nostrils daily.   furosemide 20 MG tablet Commonly known as: LASIX Take 20 mg by mouth daily as needed (fluid retention (feet swelling)).   Fusion Plus Caps Take 1 capsule by mouth in the morning. Started by: Rushie Chestnut   hydrOXYzine 10 MG tablet Commonly known as: ATARAX TAKE ONE (1) TABLET BY MOUTH 3 TIMES  DAILY AS NEEDED FOR ITCHING   hyoscyamine 0.125 MG SL tablet Commonly known as: LEVSIN SL Place 1 tablet (0.125 mg total) under the tongue every 4 (four) hours as needed.   loperamide 2 MG capsule Commonly known as: IMODIUM Take 4 mg by mouth in the morning and at bedtime.   losartan 50 MG tablet Commonly known as: COZAAR Take 50 mg by mouth in the morning and at bedtime.   Magnesium Glycinate 100 MG Caps Take 200 mg by mouth at bedtime.   meclizine 12.5 MG tablet Commonly known as: ANTIVERT Take 12.5 mg by mouth 2 (two) times daily as needed for dizziness or nausea.   meloxicam 15 MG tablet Commonly known as: MOBIC Take 15 mg by mouth daily.   metoprolol succinate 50 MG 24 hr tablet Commonly known as: TOPROL-XL Take 1 tablet (50 mg total) by mouth 2 (two) times daily. What changed: when to take this   nortriptyline 10 MG capsule Commonly known as: Pamelor Take 1 capsule (10 mg total) by mouth at bedtime.   oxyCODONE-acetaminophen 5-325 MG tablet Commonly known as: PERCOCET/ROXICET Take 1 tablet by mouth every 6 (six) hours as needed for severe pain.   potassium chloride SA 20 MEQ tablet Commonly known as: KLOR-CON M Take 1 tablet (20 mEq total) by mouth daily.   prochlorperazine 10 MG tablet Commonly known as: COMPAZINE Take 0.5 tablets (5 mg total) by mouth 2 (two) times daily as needed for nausea or vomiting.   Quintabs Tabs Take 1 tablet by mouth daily.   scopolamine 1 MG/3DAYS Commonly known as: TRANSDERM-SCOP Place 1 patch (1.5 mg total) onto the skin every 3 (three) days.   sertraline 50 MG tablet Commonly known as: ZOLOFT Take 1 tablet (50 mg total) by mouth daily.   spironolactone 25 MG tablet Commonly known as: ALDACTONE Take 12.5 mg by mouth daily.   Vitamin D3 50 MCG (2000 UT) Tabs Take 2,000 Units by mouth in the morning.        Allergies:  Allergies  Allergen Reactions   Benazepril Anaphylaxis, Swelling and Hives     angioedema Throat and lip swelling   Ondansetron Hcl Hives    Redness and hives post IV admin on 07/05/17   Codeine Nausea And Vomiting   Morphine Hives    Redness and hives noted post IV admin on 07/05/17    Past Medical History, Surgical history, Social history, and Family History were reviewed and updated.  Review of Systems: Review of Systems  Constitutional: Negative.   HENT: Negative.    Eyes: Negative.   Respiratory: Negative.    Cardiovascular: Negative.   Gastrointestinal: Negative.   Genitourinary: Negative.   Musculoskeletal: Negative.   Skin: Negative.   Neurological: Negative.   Endo/Heme/Allergies: Negative.   Psychiatric/Behavioral: Negative.     Physical Exam: Vitals:  06/24/23 0925  BP: (!) 152/75  Pulse: 80  Resp: 17  Temp: 98 F (36.7 C)  SpO2: 99%   Wt Readings from Last 3 Encounters:  06/24/23 188 lb (85.3 kg)  06/14/23 189 lb 12.8 oz (86.1 kg)  06/11/23 191 lb (86.6 kg)    Physical Exam Vitals reviewed.  HENT:     Head: Normocephalic and atraumatic.  Eyes:     Pupils: Pupils are equal, round, and reactive to light.     Comments: Mild pallor of conjunctiva  Cardiovascular:     Rate and Rhythm: Normal rate and regular rhythm.     Heart sounds: Normal heart sounds.  Pulmonary:     Effort: Pulmonary effort is normal.     Breath sounds: Normal breath sounds.  Abdominal:     General: Bowel sounds are normal.     Palpations: Abdomen is soft.  Musculoskeletal:        General: No tenderness or deformity. Normal range of motion.     Cervical back: Normal range of motion.  Lymphadenopathy:     Cervical: No cervical adenopathy.  Skin:    General: Skin is warm and dry.     Findings: No erythema or rash.  Neurological:     Mental Status: She is alert and oriented to person, place, and time.  Psychiatric:        Behavior: Behavior normal.        Thought Content: Thought content normal.        Judgment: Judgment normal.      Lab  Results  Component Value Date   WBC 3.2 (L) 06/24/2023   HGB 12.3 06/24/2023   HCT 39.0 06/24/2023   MCV 91.5 06/24/2023   PLT 120 (L) 06/24/2023   Lab Results  Component Value Date   FERRITIN 402 (H) 02/22/2023   IRON 66 02/22/2023   TIBC 346 02/22/2023   UIBC 280 02/22/2023   IRONPCTSAT 19 02/22/2023   Lab Results  Component Value Date   RETICCTPCT 1.2 11/27/2022   RBC 4.26 06/24/2023   RETICCTABS 32,640 07/16/2016   Lab Results  Component Value Date   KPAFRELGTCHN 82.6 (H) 05/21/2023   LAMBDASER 7.3 05/21/2023   KAPLAMBRATIO 11.32 (H) 05/21/2023   Lab Results  Component Value Date   IGGSERUM 3,678 (H) 05/21/2023   IGGSERUM 5,574 (H) 05/21/2023   IGA 172 05/21/2023   IGA 286 05/21/2023   IGMSERUM 127 05/21/2023   IGMSERUM 210 05/21/2023   Lab Results  Component Value Date   TOTALPROTELP 8.6 (H) 05/21/2023   ALBUMINELP 3.2 05/21/2023   A1GS 0.3 05/21/2023   A2GS 1.0 05/21/2023   BETS 1.2 05/21/2023   BETA2SER 0.5 07/16/2016   GAMS 2.9 (H) 05/21/2023   MSPIKE 2.2 (H) 05/21/2023   SPEI Comment 10/27/2022     Chemistry      Component Value Date/Time   NA 141 06/24/2023 0832   NA 143 11/08/2017 1127   NA 140 12/31/2016 1000   K 4.0 06/24/2023 0832   K 4.0 11/08/2017 1127   K 3.6 12/31/2016 1000   CL 106 06/24/2023 0832   CL 107 11/08/2017 1127   CO2 26 06/24/2023 0832   CO2 25 11/08/2017 1127   CO2 24 12/31/2016 1000   BUN 20 06/24/2023 0832   BUN 19 11/08/2017 1127   BUN 22.3 12/31/2016 1000   CREATININE 0.83 06/24/2023 0832   CREATININE 0.9 11/08/2017 1127   CREATININE 0.8 12/31/2016 1000      Component  Value Date/Time   CALCIUM 9.6 06/24/2023 0832   CALCIUM 9.9 11/08/2017 1127   CALCIUM 9.5 12/31/2016 1000   ALKPHOS 40 06/24/2023 0832   ALKPHOS 44 11/08/2017 1127   ALKPHOS 51 12/31/2016 1000   AST 25 06/24/2023 0832   AST 28 12/31/2016 1000   ALT 14 06/24/2023 0832   ALT 36 11/08/2017 1127   ALT 16 12/31/2016 1000   BILITOT 0.4  06/24/2023 0832   BILITOT 0.46 12/31/2016 1000     Encounter Diagnoses  Name Primary?   Multiple myeloma in relapse (HCC) Yes   Diarrhea, unspecified type    Iron deficiency anemia due to chronic blood loss     Impression and Plan: Ms. Lacroix is a very pleasant 67 yo [place] American female with a previous IgG kappa MGUS with progression to myeloma.  I am glad to hear that her diarrhea has improved with the IV magnesium. She will continue the oral magnesium glycinate. I have also encouraged her to start oral iron as this may help with her diarrhea and given history of low iron levels. Today it does not appear that she needs IVF or IV magnesium.   They will keep their upcoming appointments. Since she is doing so well no addition IVF needs to be scheduled at this time.   Rushie Chestnut, PA-C 8/15/202410:00 AM

## 2023-06-24 NOTE — Telephone Encounter (Signed)
Pt states [facility] wants her to take iron pills but it was too expensive at the drug store. She states pcp had called in iron pills in before that were not as expensive, so she would like to know if she could get a refill. Please advise.    [facility] - [place], [place] - [address], [place] [address] Phone: [phone]  Fax: [phone]

## 2023-06-24 NOTE — Telephone Encounter (Signed)
Pt dropped off document to be filled out by provider (Health [facility] Human Resources) Pt would like to be called when document ready to pick up at [facility] [phone] [facility]) Document put at front office tray under providers name.

## 2023-06-25 ENCOUNTER — Encounter (HOSPITAL_COMMUNITY): Payer: 59

## 2023-06-25 ENCOUNTER — Ambulatory Visit (HOSPITAL_COMMUNITY): Payer: 59

## 2023-06-25 LAB — KAPPA/LAMBDA LIGHT CHAINS
Kappa free light chain: 76.8 mg/L — ABNORMAL HIGH (ref 3.3–19.4)
Kappa, lambda light chain ratio: 13.24 — ABNORMAL HIGH (ref 0.26–1.65)
Lambda free light chains: 5.8 mg/L (ref 5.7–26.3)

## 2023-06-26 LAB — IGG, IGA, IGM
IgA: 192 mg/dL (ref 87–352)
IgG (Immunoglobin G), Serum: 3985 mg/dL — ABNORMAL HIGH (ref 586–1602)
IgM (Immunoglobulin M), Srm: 140 mg/dL (ref 26–217)

## 2023-06-27 ENCOUNTER — Other Ambulatory Visit: Payer: Self-pay

## 2023-06-28 ENCOUNTER — Other Ambulatory Visit: Payer: Self-pay | Admitting: Radiology

## 2023-06-28 ENCOUNTER — Telehealth: Payer: Self-pay

## 2023-06-28 ENCOUNTER — Other Ambulatory Visit: Payer: Self-pay | Admitting: Family Medicine

## 2023-06-28 DIAGNOSIS — C9000 Multiple myeloma not having achieved remission: Secondary | ICD-10-CM

## 2023-06-28 DIAGNOSIS — I5042 Chronic combined systolic (congestive) and diastolic (congestive) heart failure: Secondary | ICD-10-CM | POA: Diagnosis not present

## 2023-06-28 NOTE — Telephone Encounter (Signed)
Received phone call from patient relative in reference to patient PET scan. [name] (pt relative) was wondering why the patient showed up for the PET scan to be told it was cancelled.  This RN educated family that per the scheduling note it was cancelled as it hadn't been authorized yet but it was now and they had attempted to call patient twice to reschedule.  [name] verbalized understanding and had no further questions.

## 2023-06-28 NOTE — Telephone Encounter (Signed)
Called pt lvm that it was Dr. Myna Hidalgo office that prescribe the iron pill Will need to let know.

## 2023-06-28 NOTE — Telephone Encounter (Signed)
Pt called back to let Shamaine know that the iron medication she is requesting to be sent in is called Ferosul 65-FE mg tablet. Please call & advise pt.

## 2023-06-28 NOTE — Telephone Encounter (Signed)
Forms has been placed in bin for Dr.Blyth to review and  Will call when forms are ready.

## 2023-06-28 NOTE — Telephone Encounter (Signed)
Called pt lvm to call our office back ,and Pt was seen in ER  on 06-20-23.

## 2023-06-28 NOTE — H&P (Signed)
Referring Physician(s): Ennever,Peter R  Supervising Physician: Irish Lack  Patient Status:  [facility]  Chief Complaint:  "i'm here for a bone marrow biopsy and Port-A-Cath"  Subjective: Patient known to IR/NIR team from cerebral arteriogram in 2006 and bone marrow biopsy in 2019 X2.  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  She is scheduled today for bone marrow biopsy and Port-A-Cath placement to assist with treatment. She denies fever,HA,CP,dyspnea, cough, N/V or bleeding; she does have occ epigastric discomfort, occ back pain, loose stools   Past Medical History:  Diagnosis Date   Abnormal SPEP 07/26/2016   Arthritis    knees, hands   Back pain 07/14/2016   Bowel obstruction (HCC) 11/2014   C. 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   Multiple myeloma (HCC) 07/20/2018   Multiple myeloma not having achieved remission (HCC) 07/20/2018   Myalgia 12/31/2016   Obstructive sleep apnea 03/31/2015   Presence of permanent cardiac pacemaker    SOB (shortness of breath) 04/09/2016   Stroke (HCC)    TIAs   UTI (urinary tract infection)    Past Surgical History:  Procedure Laterality Date   ABDOMINAL HYSTERECTOMY     menorraghia, 2006, total   BIOPSY  09/01/2022   Procedure: BIOPSY;  Surgeon:  Lynann Bologna, MD;  Location: [facility];  Service: Gastroenterology;;   CHOLECYSTECTOMY     COLONOSCOPY  2018   [facility] per patient   COLONOSCOPY WITH PROPOFOL N/A 09/01/2022   Procedure: COLONOSCOPY WITH PROPOFOL;  Surgeon: Lynann Bologna, MD;  Location: [facility];  Service: Gastroenterology;  Laterality: N/A;   ESOPHAGOGASTRODUODENOSCOPY  2018   [facility]    ESOPHAGOGASTRODUODENOSCOPY (EGD) WITH PROPOFOL N/A 09/01/2022   Procedure: ESOPHAGOGASTRODUODENOSCOPY (EGD) WITH PROPOFOL;  Surgeon: Lynann Bologna, MD;  Location: [facility];  Service: Gastroenterology;  Laterality: N/A;   KNEE SURGERY     right, repair torn torn cartialage   PACEMAKER INSERTION  08/2014   TONSILLECTOMY     TUBAL LIGATION        Allergies: Benazepril, Ondansetron hcl, Codeine, and Morphine  Medications: Prior to Admission medications   Medication Sig Start Date End Date Taking? Authorizing Provider  acyclovir (ZOVIRAX) 400 MG tablet Take 1 tablet (400 mg total) by mouth 2 (two) times daily. 04/22/23   Josph Macho, MD  albuterol (PROVENTIL) (2.5 MG/3ML) 0.083% nebulizer solution Take 3 mLs (2.5 mg total) by nebulization every 6 (six) hours as needed for wheezing or shortness of breath. 12/15/22   [name], MD  albuterol (VENTOLIN HFA) 108 (90 Base) MCG/ACT inhaler Inhale 2 puffs into the lungs every 6 (six) hours as needed for wheezing or shortness of breath. 12/07/22   [name], MD  ALPRAZolam Prudy Feeler) 0.25 MG tablet Take 1-2 tablets (0.25-0.5 mg total) by mouth 3 (three) times daily  as needed for anxiety. 06/14/23   [name], MD  amLODipine (NORVASC) 5 MG tablet Take 1 tablet (5 mg total) by mouth daily. 03/16/23   [name], MD  amoxicillin-clavulanate (AUGMENTIN) 875-125 MG tablet Take 1 tablet by mouth 2 (two) times daily. 06/14/23   [name], MD  aspirin EC 81 MG tablet Take 81 mg by mouth daily. Swallow whole.    [provider]   budesonide (PULMICORT) 0.5 MG/2ML nebulizer solution Take 2 mLs (0.5 mg total) by nebulization 2 (two) times daily as needed (cough, wheezing or shortness of breath). 12/31/22   Martina Sinner, MD  cetirizine (ZYRTEC) 10 MG tablet Take 10 mg by mouth at bedtime.    [provider]  Cholecalciferol (VITAMIN D3) 50 MCG (2000 UT) TABS Take 2,000 Units by mouth in the morning.    [provider]  cholestyramine light (PREVALITE) 4 g packet Take 4 g by mouth 2 (two) times daily.    [provider]  dicyclomine (BENTYL) 10 MG capsule Take 1 capsule (10 mg total) by mouth 4 (four) times daily -  before meals and at bedtime. Patient taking differently: Take 10 mg by mouth 4 (four) times daily -  before meals and at bedtime. cramping 01/25/23 04/25/23  Judyann Munson, MD  diphenoxylate-atropine (LOMOTIL) 2.5-0.025 MG tablet Take 1 tablet by mouth 2 (two) times daily. 05/19/23   Lynann Bologna, MD  fluconazole (DIFLUCAN) 150 MG tablet Take 1 tablet (150 mg total) by mouth once a week. 06/14/23   [name], MD  fluticasone (FLONASE) 50 MCG/ACT nasal spray Place 2 sprays into both nostrils daily. 01/07/20   Joy, Shawn C, PA-C  furosemide (LASIX) 20 MG tablet Take 20 mg by mouth daily as needed (fluid retention (feet swelling)). 07/03/20   [provider]  hydrOXYzine (ATARAX) 10 MG tablet TAKE ONE (1) TABLET BY MOUTH 3 TIMES DAILY AS NEEDED FOR ITCHING 03/03/23   [name], MD  hyoscyamine (LEVSIN SL) 0.125 MG SL tablet Place 1 tablet (0.125 mg total) under the tongue every 4 (four) hours as needed. 04/27/23   [name], MD  Iron-FA-B Cmp-C-Biot-Probiotic (FUSION PLUS) CAPS Take 1 capsule by mouth in the morning. 06/24/23   Rushie Chestnut, PA-C  loperamide (IMODIUM) 2 MG capsule Take 4 mg by mouth in the morning and at bedtime.    [provider]  losartan (COZAAR) 50 MG tablet Take 50 mg by mouth in the morning and at bedtime. 04/14/21   [provider]  Magnesium Glycinate 100 MG CAPS Take 200 mg by mouth at bedtime. 09/21/22   [provider]  meclizine (ANTIVERT) 12.5 MG tablet Take 12.5 mg by mouth 2 (two) times daily as needed for dizziness or nausea. 01/28/22   [provider]  meloxicam (MOBIC) 15 MG tablet Take 15 mg by mouth daily. 12/31/22   [provider]  metoprolol succinate (TOPROL-XL) 50 MG 24 hr tablet Take 1 tablet (50 mg total) by mouth 2 (two) times daily. Patient taking differently: Take 50 mg by mouth daily. 02/23/23   [name], MD  Multiple Vitamin Brandt Loosen) TABS Take 1 tablet by mouth daily. 11/06/21   [provider]  nortriptyline (PAMELOR) 10 MG capsule Take 1 capsule (10 mg total) by mouth at bedtime. 06/14/23   [name], MD  oxyCODONE-acetaminophen (PERCOCET/ROXICET) 5-325 MG tablet Take 1 tablet by mouth every 6 (six) hours as needed for severe pain.  11/07/21   [provider]  potassium chloride SA (KLOR-CON M) 20 MEQ tablet Take 1 tablet (20 mEq total) by mouth daily. 01/15/23   [name], MD  Probiotic Product (ALIGN) 4 MG CAPS Take 1 capsule (4 mg total) by mouth daily. 03/16/23   [name], MD  prochlorperazine (COMPAZINE) 10 MG tablet Take 0.5 tablets (5 mg total) by mouth 2 (two) times daily as needed for nausea or vomiting. 03/16/23   [name], MD  scopolamine (TRANSDERM-SCOP) 1 MG/3DAYS Place 1 patch (1.5 mg total) onto the skin every 3 (three) days. 06/14/23   [name], MD  sertraline (ZOLOFT) 50 MG tablet Take 1 tablet (50 mg total) by mouth daily. 06/14/23   [name], MD  spironolactone (ALDACTONE) 25 MG tablet Take 12.5 mg by mouth daily.    [provider]     Vital Signs:temp 98  BP 156/99  HR 79  R 14  O2 SATS 98% RA     Code Status: FULL CODE  Physical Exam; awake, answers questions ok, chest- CTA bilat; left chest wall pacer/defib; heart- RRR; abd- soft,+BS,mild epigastric tenderness; no sig  LE edema  Imaging: No results found.  Labs:  CBC: Recent Labs    05/23/23 2019 06/14/23 1049 06/18/23 0850 06/24/23 0832  WBC 15.3* 2.8* 2.3* 3.2*  HGB 11.5* 12.5 11.7* 12.3  HCT 35.2* 39.7 36.7 39.0  PLT 105* 151.0 138* 120*    COAGS: No results for input(s): "INR", "APTT" in the last 8760 hours.  BMP: Recent Labs    05/21/23 1224 05/23/23 2019 06/14/23 1049 06/18/23 0850 06/24/23 0832  NA 139 137 138 138 141  K 4.0 3.4* 4.5 3.7 4.0  CL 105 106 106 105 106  CO2 27 21* 26 27 26   GLUCOSE 117* 113* 97 95 105*  BUN 15 15 20 19 20   CALCIUM 9.9 9.1 9.4 9.7 9.6  CREATININE 0.67 0.70 0.65 0.68 0.83  GFRNONAA >60 >60  --  >60 >60    LIVER FUNCTION TESTS: Recent Labs    05/21/23 1224 06/14/23 1049 06/18/23 0850 06/24/23 0832  BILITOT 0.5 0.4 0.4 0.4  AST 30 26 25 25   ALT 15 19 18 14   ALKPHOS 45 50 46 40  PROT 9.5* 9.6* 9.7* 9.7*  ALBUMIN 3.8 3.9 3.8 3.7    Assessment and Plan: 67 year old female with past medical history of arthritis, CHF, depression, GERD, hyperlipidemia, hypertension, iron deficiency anemia, obstructive sleep apnea, heart disease with pacemaker/defib, TIAs, and 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  She is scheduled today for bone marrow biopsy and Port-A-Cath placement to assist with treatment.Risks and benefits of image guided bone marrow biopsy/port-a-catheter placement were discussed with the patient /POA [facility] including, but not limited to bleeding, infection, pneumothorax, or fibrin sheath development and need for additional procedures.  All of the patient's questions were answered, patient is agreeable to proceed. Consent signed and in chart.    Electronically Signed: D. Jeananne Rama, PA-C 06/28/2023, 2:21 PM   I spent a total of 25 minutes at the the patient's bedside AND on the patient's hospital floor or unit, greater than 50% of which was counseling/coordinating care for image guided bone marrow  biopsy and Port-A-Cath placement

## 2023-06-28 NOTE — Telephone Encounter (Signed)
Pt called back about medication for her Iron stating  iron pills it was too expensive. Pt was advised to let Dr.Ennerer office know ,and pharmacist recommend OTC  Iron, told her to see if it was ok with them as well.

## 2023-06-29 ENCOUNTER — Ambulatory Visit: Payer: 59 | Admitting: Family Medicine

## 2023-06-29 ENCOUNTER — Encounter (HOSPITAL_COMMUNITY): Payer: Self-pay

## 2023-06-29 ENCOUNTER — Other Ambulatory Visit: Payer: Self-pay | Admitting: Hematology & Oncology

## 2023-06-29 ENCOUNTER — Ambulatory Visit (HOSPITAL_COMMUNITY)
Admission: RE | Admit: 2023-06-29 | Discharge: 2023-06-29 | Disposition: A | Discharge status: Home / Self Care | Payer: 59 | Source: Ambulatory Visit | Attending: Hematology & Oncology | Admitting: Hematology & Oncology

## 2023-06-29 DIAGNOSIS — I509 Heart failure, unspecified: Secondary | ICD-10-CM | POA: Insufficient documentation

## 2023-06-29 DIAGNOSIS — Z950 Presence of cardiac pacemaker: Secondary | ICD-10-CM | POA: Insufficient documentation

## 2023-06-29 DIAGNOSIS — Z8673 Personal history of transient ischemic attack (TIA), and cerebral infarction without residual deficits: Secondary | ICD-10-CM | POA: Diagnosis not present

## 2023-06-29 DIAGNOSIS — C9000 Multiple myeloma not having achieved remission: Secondary | ICD-10-CM

## 2023-06-29 DIAGNOSIS — D509 Iron deficiency anemia, unspecified: Secondary | ICD-10-CM | POA: Insufficient documentation

## 2023-06-29 DIAGNOSIS — C9002 Multiple myeloma in relapse: Secondary | ICD-10-CM

## 2023-06-29 DIAGNOSIS — K219 Gastro-esophageal reflux disease without esophagitis: Secondary | ICD-10-CM | POA: Insufficient documentation

## 2023-06-29 DIAGNOSIS — I110 Hypertensive heart disease with heart failure: Secondary | ICD-10-CM | POA: Diagnosis not present

## 2023-06-29 DIAGNOSIS — F32A Depression, unspecified: Secondary | ICD-10-CM | POA: Diagnosis not present

## 2023-06-29 DIAGNOSIS — G4733 Obstructive sleep apnea (adult) (pediatric): Secondary | ICD-10-CM | POA: Diagnosis not present

## 2023-06-29 DIAGNOSIS — D61818 Other pancytopenia: Secondary | ICD-10-CM | POA: Insufficient documentation

## 2023-06-29 DIAGNOSIS — E785 Hyperlipidemia, unspecified: Secondary | ICD-10-CM | POA: Diagnosis not present

## 2023-06-29 HISTORY — PX: IR IMAGING GUIDED PORT INSERTION: IMG5740

## 2023-06-29 LAB — CBC WITH DIFFERENTIAL/PLATELET
Abs Immature Granulocytes: 0.01 10*3/uL (ref 0.00–0.07)
Basophils Absolute: 0 10*3/uL (ref 0.0–0.1)
Basophils Relative: 1 %
Eosinophils Absolute: 0.1 10*3/uL (ref 0.0–0.5)
Eosinophils Relative: 2 %
HCT: 38.8 % (ref 36.0–46.0)
Hemoglobin: 11.9 g/dL — ABNORMAL LOW (ref 12.0–15.0)
Immature Granulocytes: 0 %
Lymphocytes Relative: 33 %
Lymphs Abs: 0.9 10*3/uL (ref 0.7–4.0)
MCH: 28.1 pg (ref 26.0–34.0)
MCHC: 30.7 g/dL (ref 30.0–36.0)
MCV: 91.5 fL (ref 80.0–100.0)
Monocytes Absolute: 0.3 10*3/uL (ref 0.1–1.0)
Monocytes Relative: 10 %
Neutro Abs: 1.5 10*3/uL — ABNORMAL LOW (ref 1.7–7.7)
Neutrophils Relative %: 54 %
Platelets: 105 10*3/uL — ABNORMAL LOW (ref 150–400)
RBC: 4.24 MIL/uL (ref 3.87–5.11)
RDW: 14.2 % (ref 11.5–15.5)
WBC: 2.9 10*3/uL — ABNORMAL LOW (ref 4.0–10.5)
nRBC: 0 % (ref 0.0–0.2)

## 2023-06-29 MED ORDER — FENTANYL CITRATE (PF) 100 MCG/2ML IJ SOLN
INTRAMUSCULAR | Status: AC | PRN
Start: 2023-06-29 — End: 2023-06-29
  Administered 2023-06-29: 50 ug via INTRAVENOUS

## 2023-06-29 MED ORDER — CEFAZOLIN SODIUM-DEXTROSE 2-4 GM/100ML-% IV SOLN
INTRAVENOUS | Status: AC | PRN
  Administered 2023-06-29: 2 g via INTRAVENOUS

## 2023-06-29 MED ORDER — HEPARIN SOD (PORK) LOCK FLUSH 100 UNIT/ML IV SOLN
500.0000 [IU] | Freq: Once | INTRAVENOUS | Status: AC
  Administered 2023-06-29: 500 [IU] via INTRAVENOUS

## 2023-06-29 MED ORDER — MIDAZOLAM HCL 2 MG/2ML IJ SOLN
INTRAMUSCULAR | Status: AC | PRN
  Administered 2023-06-29 (×2): 1 mg via INTRAVENOUS

## 2023-06-29 MED ORDER — MIDAZOLAM HCL 2 MG/2ML IJ SOLN
INTRAMUSCULAR | Status: AC | PRN
  Administered 2023-06-29: 1 mg via INTRAVENOUS

## 2023-06-29 MED ORDER — CEFAZOLIN SODIUM-DEXTROSE 2-4 GM/100ML-% IV SOLN
INTRAVENOUS | Status: AC
  Filled 2023-06-29: qty 100

## 2023-06-29 MED ORDER — LIDOCAINE HCL 1 % IJ SOLN
INTRAMUSCULAR | Status: AC
  Filled 2023-06-29: qty 20

## 2023-06-29 MED ORDER — FLUMAZENIL 0.5 MG/5ML IV SOLN
INTRAVENOUS | Status: AC
  Filled 2023-06-29: qty 5

## 2023-06-29 MED ORDER — FENTANYL CITRATE (PF) 100 MCG/2ML IJ SOLN
INTRAMUSCULAR | Status: AC | PRN
Start: 2023-06-29 — End: 2023-06-29
  Administered 2023-06-29 (×2): 50 ug via INTRAVENOUS

## 2023-06-29 MED ORDER — CEFAZOLIN SODIUM-DEXTROSE 2-4 GM/100ML-% IV SOLN: 2.0000 g | INTRAVENOUS | Status: DC

## 2023-06-29 MED ORDER — MIDAZOLAM HCL 2 MG/2ML IJ SOLN
INTRAMUSCULAR | Status: AC
  Filled 2023-06-29: qty 4

## 2023-06-29 MED ORDER — FENTANYL CITRATE (PF) 100 MCG/2ML IJ SOLN
INTRAMUSCULAR | Status: AC
  Filled 2023-06-29: qty 4

## 2023-06-29 MED ORDER — LIDOCAINE HCL 1 % IJ SOLN
20.0000 mL | Freq: Once | INTRAMUSCULAR | Status: AC
  Administered 2023-06-29: 20 mL via INTRADERMAL

## 2023-06-29 MED ORDER — HEPARIN SOD (PORK) LOCK FLUSH 100 UNIT/ML IV SOLN
INTRAVENOUS | Status: AC
  Filled 2023-06-29: qty 5

## 2023-06-29 MED ORDER — DIPHENHYDRAMINE HCL 50 MG/ML IJ SOLN
INTRAMUSCULAR | Status: AC | PRN
  Administered 2023-06-29: 50 mg via INTRAVENOUS

## 2023-06-29 MED ORDER — DIPHENHYDRAMINE HCL 50 MG/ML IJ SOLN
INTRAMUSCULAR | Status: AC
  Filled 2023-06-29: qty 1

## 2023-06-29 MED ORDER — SODIUM CHLORIDE 0.9 % IV SOLN: INTRAVENOUS | Status: DC

## 2023-06-29 MED ORDER — NALOXONE HCL 0.4 MG/ML IJ SOLN
INTRAMUSCULAR | Status: AC
  Filled 2023-06-29: qty 1

## 2023-06-29 NOTE — Procedures (Signed)
Interventional Radiology Procedure Note  Procedure: CT guided bone marrow aspiration and biopsy  Complications: None  EBL: < 10 mL  Findings: Aspirate and core biopsy performed of bone marrow in right iliac bone.  Plan: Bedrest supine x 1 hrs  Glenn T. Yamagata, M.D Pager:  [phone]   

## 2023-06-29 NOTE — Telephone Encounter (Signed)
Refill sent.

## 2023-06-29 NOTE — Procedures (Signed)
Interventional Radiology Procedure Note  Procedure: Single Lumen Power Port Placement    Access:  Right IJ vein.  Findings: Catheter tip positioned at SVC/RA junction. Port is ready for immediate use.   Complications: None  EBL: < 10 mL  Recommendations:  - Ok to shower in 24 hours - Do not submerge for 7 days - Routine line care   Glenn T. Yamagata, M.D Pager:  [phone]   

## 2023-06-29 NOTE — Progress Notes (Signed)
   06/29/23 1026  OBSTRUCTIVE SLEEP APNEA  Have you ever been diagnosed with sleep apnea through a sleep study? Yes  If yes, do you have and use a CPAP or BPAP machine every night? 0  Do you snore loudly (loud enough to be heard through closed doors)?  1  Do you often feel tired, fatigued, or sleepy during the daytime (such as falling asleep during driving or talking to someone)? 0  Has anyone observed you stop breathing during your sleep? 1  Do you have, or are you being treated for high blood pressure? 1  BMI more than 35 kg/m2? 1  Age > 50 (1-yes) 1  Female Gender (Yes=1) 0  Obstructive Sleep Apnea Score 5   Currently under the care of  a sleep doctor, per patient she has an appt to see provider next month.

## 2023-06-29 NOTE — Discharge Instructions (Signed)
Implanted Port Insertion, Care After  The following information offers guidance on how to care for yourself after your procedure. Your health care provider may also give you more specific instructions. If you have problems or questions, contact your health care provider.  What can I expect after the procedure? After the procedure, it is common to have: Discomfort at the port insertion site. Bruising on the skin over the port. This should improve over 3-4 days.   Urgent needs - Interventional Radiology, clinic [phone] (mon-fri 8-5).   Wound - May remove dressing and shower in 24 to 48 hours.  Keep site clean and dry.  Replace with bandaid as needed.  Do not submerge in tub or water until site healing well. If closed with glue, glue will flake off on its own.   If ordered by your provider, may start Emla cream (or any other creams ointments or lotions) in 2 weeks or after incision is healed. Port is ready for use immediately.   After completion of treatment, your provider should have you set up for monthly port flushes.   Follow these instructions at home: [facility] care After your port is placed, you will get a manufacturer's information card. The card has information about your port. Keep this card with you at all times. Take care of the port as told by your health care provider. Ask your health care provider if you or a family member can get training for taking care of the port at home. A home health care nurse will be be available to help care for the port. Make sure to remember what type of port you have. Incision care     Follow instructions from your health care provider about how to take care of your port insertion site. Make sure you: Wash your hands with soap and water for at least 20 seconds before and after you change your bandage (dressing). If soap and water are not available, use hand sanitizer. Change your dressing as told by your health care provider. Leave stitches  (sutures), skin glue, or adhesive strips in place. These skin closures may need to stay in place for 2 weeks or longer. If adhesive strip edges start to loosen and curl up, you may trim the loose edges. Do not remove adhesive strips completely unless your health care provider tells you to do that. Check your port insertion site every day for signs of infection. Check for: Redness, swelling, or pain. Fluid or blood. Warmth. Pus or a bad smell. Activity Return to your normal activities as told by your health care provider. Ask your health care provider what activities are safe for you. You may have to avoid lifting. Ask your health care provider how much you can safely lift. General instructions Take over-the-counter and prescription medicines only as told by your health care provider. Do not take baths, swim, or use a hot tub until your health care provider approves. Ask your health care provider if you may take showers. You may only be allowed to take sponge baths. If you were given a sedative during the procedure, it can affect you for several hours. Do not drive or operate machinery until your health care provider says that it is safe. Wear a medical alert bracelet in case of an emergency. This will tell any health care providers that you have a port. Keep all follow-up visits. This is important. Contact a health care provider if: You cannot flush your port with saline as directed, or you cannot  draw blood from the port. You have a fever or chills. You have redness, swelling, or pain around your port insertion site. You have fluid or blood coming from your port insertion site. Your port insertion site feels warm to the touch. You have pus or a bad smell coming from the port insertion site. Get help right away if: You have chest pain or shortness of breath. You have bleeding from your port that you cannot control. These symptoms may be an emergency. Get help right away. Call 911. Do not  wait to see if the symptoms will go away. Do not drive yourself to the hospital. Summary Take care of the port as told by your health care provider. Keep the manufacturer's information card with you at all times. Change your dressing as told by your health care provider. Contact a health care provider if you have a fever or chills or if you have redness, swelling, or pain around your port insertion site. Keep all follow-up visits. This information is not intended to replace advice given to you by your health care provider. Make sure you discuss any questions you have with your health care provider. Document Revised: 04/29/2021 Document Reviewed: 04/29/2021 Elsevier Patient Education  2023 Elsevier Inc.                            Moderate Conscious Sedation, Adult, Care After  This sheet gives you information about how to care for yourself after your procedure. Your health care provider may also give you more specific instructions. If you have problems or questions, contact your health care provider. What can I expect after the procedure? After the procedure, it is common to have: Sleepiness for several hours. Impaired judgment for several hours. Difficulty with balance. Vomiting if you eat too soon. Follow these instructions at home: For the time period you were told by your health care provider:     Rest. Do not participate in activities where you could fall or become injured. Do not drive or use machinery. Do not drink alcohol. Do not take sleeping pills or medicines that cause drowsiness. Do not make important decisions or sign legal documents. Do not take care of children on your own. Eating and drinking  Follow the diet recommended by your health care provider. Drink enough fluid to keep your urine pale yellow. If you vomit: Drink water, juice, or soup when you can drink without vomiting. Make sure you have little or no nausea before eating solid foods. General  instructions Take over-the-counter and prescription medicines only as told by your health care provider. Have a responsible adult stay with you for the time you are told. It is important to have someone help care for you until you are awake and alert. Do not smoke. Keep all follow-up visits as told by your health care provider. This is important. Contact a health care provider if: You are still sleepy or having trouble with balance after 24 hours. You feel light-headed. You keep feeling nauseous or you keep vomiting. You develop a rash. You have a fever. You have redness or swelling around the IV site. Get help right away if: You have trouble breathing. You have new-onset confusion at home. Summary After the procedure, it is common to feel sleepy, have impaired judgment, or feel nauseous if you eat too soon. Rest after you get home. Know the things you should not do after the procedure. Follow the diet recommended by  your health care provider and drink enough fluid to keep your urine pale yellow. Get help right away if you have trouble breathing or new-onset confusion at home. This information is not intended to replace advice given to you by your health care provider. Make sure you discuss any questions you have with your health care provider. Document Revised: 02/23/2020 Document Reviewed: 09/21/2019 Elsevier Patient Education  2023 Elsevier Inc.                                                                                              Bone Marrow Aspiration and Bone Marrow Biopsy, Adult, Care After  The following information offers guidance on how to care for yourself after your procedure. Your health care provider may also give you more specific instructions. If you have problems or questions, contact your health care provider.  What can I expect after the procedure?  May remove dressing or bandaid and shower tomorrow.  Keep site clean and dry. Replace with clean dressing or  bandaid as necessary. Urgent needs IR clinic [phone] (mon-fri 8-5).  After the procedure, it is common to have: Mild pain and tenderness. Swelling. Bruising. Follow these instructions at home: Incision care  Follow instructions from your health care provider about how to take care of the incision site. Make sure you: Wash your hands with soap and water for at least 20 seconds before and after you change your bandage (dressing). If soap and water are not available, use hand sanitizer. Change your dressing as told by your health care provider. Leave stitches (sutures), skin glue, or adhesive strips in place. These skin closures may need to stay in place for 2 weeks or longer. If adhesive strip edges start to loosen and curl up, you may trim the loose edges. Do not remove adhesive strips completely unless your health care provider tells you to do that. Check your incision site every day for signs of infection. Check for: More redness, swelling, or pain. Fluid or blood. Warmth. Pus or a bad smell. Activity Return to your normal activities as told by your health care provider. Ask your health care provider what activities are safe for you. Do not lift anything that is heavier than 10 lb (4.5 kg), or the limit that you are told, until your health care provider says that it is safe. If you were given a sedative during the procedure, it can affect you for several hours. Do not drive or operate machinery until your health care provider says that it is safe. General instructions  Take over-the-counter and prescription medicines only as told by your health care provider. Do not take baths, swim, or use a hot tub until your health care provider approves. Ask your health care provider if you may take showers. You may only be allowed to take sponge baths. If directed, put ice on the affected area. To do this: Put ice in a plastic bag. Place a towel between your skin and the bag. Leave the ice on  for 20 minutes, 2-3 times a day. If your skin turns bright red, remove the ice right away to prevent  skin damage. The risk of skin damage is higher if you cannot feel pain, heat, or cold. Contact a health care provider if: You have signs of infection. Your pain is not controlled with medicine. You have cancer, and a temperature of 100.86F (38C) or higher. Get help right away if: You have a temperature of 101F (38.3C) or higher, or as told by your health care provider. You have bleeding from the incision site that cannot be controlled. This information is not intended to replace advice given to you by your health care provider. Make sure you discuss any questions you have with your health care provider. Document Revised: 03/02/2022 Document Reviewed: 03/02/2022 Elsevier Patient Education  2023 [employer].

## 2023-06-30 ENCOUNTER — Ambulatory Visit: Payer: Self-pay | Admitting: Licensed Clinical Social Worker

## 2023-06-30 NOTE — Patient Instructions (Signed)
Social Work Visit Information  Thank you for taking time to visit with me today. Please don't hesitate to contact me if I can be of assistance to you.   Following are the goals we discussed today:   Goals Addressed             This Visit's Progress    CAPS program       Activities and task to complete in order to accomplish goals.   Personal Care is currently in place  please contact [facility] [phone] if you have any questions or concerns.  We also worked on Scientific laboratory technician set up today. Please call MyChart Help Desk  with questions [phone]) I will collaborate with Dr. Rogelia Rohrer to get the CAPS from completed and faxed to [facility], will provide an update once completed         No follow up scheduled with social work at this time. Will follow up in 7 to 10 days .Patient will call office if needed prior to next encounter.  Please call the care guide team at [phone] if you need to cancel or reschedule your appointment.   If you or anyone you know are experiencing a Mental Health or Behavioral Health Crisis or need someone to talk to, please call the Suicide and Crisis Lifeline: 988 call the [place] National Suicide Prevention Lifeline: 4148241607 or TTY: 314-424-4604 TTY 940-504-1936) to talk to a trained counselor call 1-800-273-TALK (toll free, 24 hour hotline) go to [facility] [address] [place] [phone])   Patient verbalizes understanding of instructions and care plan provided today and agrees to view in MyChart. Active MyChart status and patient understanding of how to access instructions and care plan via MyChart confirmed with patient.       [name], LCSW Social Work Care Coordination  [facility] [employer] [phone]

## 2023-06-30 NOTE — Patient Outreach (Signed)
  Care Coordination  Follow Up Visit Note   06/30/2023 Name: Tami [name] MRN: [id number] DOB: 05-25-1956  Tami [name] is a 67 y.o. year old female who sees [name], MD for primary care. I spoke with  Tami [name] by phone today.  What matters to the patients health and wellness today?  Getting CAPS Services  Patient was accompanied by her HPOA Tami [name] who provided information during this encounter. Patient reports feeling good today, her voice is strong and she is engaged in conversation. We were able to get her set up with Mychart and I collaborated with the PCP's office ref. The CAPS form .     Goals Addressed             This Visit's Progress    CAPS program       Activities and task to complete in order to accomplish goals.   Personal Care is currently in place  please contact [facility] [phone] if you have any questions or concerns.  We also worked on Scientific laboratory technician set up today. Please call MyChart Help Desk  with questions [phone]) I will collaborate with Dr. Rogelia [name] to get the CAPS from completed and faxed to [facility], will provide an update once completed        SDOH assessments and interventions completed:  No  Care Coordination Interventions:  Yes, provided  Interventions Today    Flowsheet Row Most Recent Value  Chronic Disease   Chronic disease during today's visit Hypertension (HTN), Congestive Heart Failure (CHF)  General Interventions   General Interventions Discussed/Reviewed Level of Care, Communication with  [assisted with setting up mychart]  Communication with PCP/Specialists  [ref. CAPS form]  Level of Care Personal Care Services  [CAPS application received and given to PCP to complete]       Follow up plan:  no f/u scheduled will continue to collaborate with PCP until CAPS form is submitted    Encounter Outcome:  Pt. Visit Completed   Tami Hines, LCSW Social Work Care Coordination  [facility] [employer] [phone]

## 2023-07-01 ENCOUNTER — Encounter: Payer: Self-pay | Admitting: Hematology & Oncology

## 2023-07-01 ENCOUNTER — Inpatient Hospital Stay (HOSPITAL_BASED_OUTPATIENT_CLINIC_OR_DEPARTMENT_OTHER): Payer: 59 | Admitting: Hematology & Oncology

## 2023-07-01 ENCOUNTER — Inpatient Hospital Stay: Payer: 59

## 2023-07-01 VITALS — BP 140/83 | HR 89 | Temp 97.8°F | Resp 20 | Ht 66.0 in | Wt 189.0 lb

## 2023-07-01 DIAGNOSIS — C9000 Multiple myeloma not having achieved remission: Secondary | ICD-10-CM

## 2023-07-01 DIAGNOSIS — Z8639 Personal history of other endocrine, nutritional and metabolic disease: Secondary | ICD-10-CM | POA: Diagnosis not present

## 2023-07-01 DIAGNOSIS — R197 Diarrhea, unspecified: Secondary | ICD-10-CM | POA: Diagnosis not present

## 2023-07-01 LAB — PROTEIN ELECTROPHORESIS, SERUM, WITH REFLEX
A/G Ratio: 0.7 (ref 0.7–1.7)
Albumin ELP: 3.7 g/dL (ref 2.9–4.4)
Alpha-1-Globulin: 0.2 g/dL (ref 0.0–0.4)
Alpha-2-Globulin: 0.8 g/dL (ref 0.4–1.0)
Beta Globulin: 1 g/dL (ref 0.7–1.3)
Gamma Globulin: 3.3 g/dL — ABNORMAL HIGH (ref 0.4–1.8)
Globulin, Total: 5.4 g/dL — ABNORMAL HIGH (ref 2.2–3.9)
M-Spike, %: 2.3 g/dL — ABNORMAL HIGH
SPEP Interpretation: 0
Total Protein ELP: 9.1 g/dL — ABNORMAL HIGH (ref 6.0–8.5)

## 2023-07-01 LAB — CMP (CANCER CENTER ONLY)
ALT: 14 U/L (ref 0–44)
AST: 24 U/L (ref 15–41)
Albumin: 3.8 g/dL (ref 3.5–5.0)
Alkaline Phosphatase: 42 U/L (ref 38–126)
Anion gap: 6 (ref 5–15)
BUN: 16 mg/dL (ref 8–23)
CO2: 28 mmol/L (ref 22–32)
Calcium: 9.3 mg/dL (ref 8.9–10.3)
Chloride: 105 mmol/L (ref 98–111)
Creatinine: 0.63 mg/dL (ref 0.44–1.00)
GFR, Estimated: >60 mL/min (ref >60)
Glucose, Bld: 98 mg/dL (ref 70–99)
Potassium: 4 mmol/L (ref 3.5–5.1)
Sodium: 139 mmol/L (ref 135–145)
Total Bilirubin: 0.4 mg/dL (ref 0.3–1.2)
Total Protein: 9.3 g/dL — ABNORMAL HIGH (ref 6.5–8.1)

## 2023-07-01 LAB — CBC WITH DIFFERENTIAL (CANCER CENTER ONLY)
Abs Immature Granulocytes: 0 10*3/uL (ref 0.00–0.07)
Basophils Absolute: 0 10*3/uL (ref 0.0–0.1)
Basophils Relative: 1 %
Eosinophils Absolute: 0.1 10*3/uL (ref 0.0–0.5)
Eosinophils Relative: 3 %
HCT: 38.6 % (ref 36.0–46.0)
Hemoglobin: 12 g/dL (ref 12.0–15.0)
Immature Granulocytes: 0 %
Lymphocytes Relative: 37 %
Lymphs Abs: 0.9 10*3/uL (ref 0.7–4.0)
MCH: 28.5 pg (ref 26.0–34.0)
MCHC: 31.1 g/dL (ref 30.0–36.0)
MCV: 91.7 fL (ref 80.0–100.0)
Monocytes Absolute: 0.2 10*3/uL (ref 0.1–1.0)
Monocytes Relative: 9 %
Neutro Abs: 1.2 10*3/uL — ABNORMAL LOW (ref 1.7–7.7)
Neutrophils Relative %: 50 %
Platelet Count: 101 10*3/uL — ABNORMAL LOW (ref 150–400)
RBC: 4.21 MIL/uL (ref 3.87–5.11)
RDW: 14.3 % (ref 11.5–15.5)
WBC Count: 2.4 10*3/uL — ABNORMAL LOW (ref 4.0–10.5)
nRBC: 0 % (ref 0.0–0.2)

## 2023-07-01 LAB — IMMUNOFIXATION REFLEX, SERUM
IgA: 201 mg/dL (ref 87–352)
IgG (Immunoglobin G), Serum: 4692 mg/dL — ABNORMAL HIGH (ref 586–1602)
IgM (Immunoglobulin M), Srm: 159 mg/dL (ref 26–217)

## 2023-07-01 LAB — SURGICAL PATHOLOGY

## 2023-07-01 LAB — LACTATE DEHYDROGENASE: LDH: 215 U/L — ABNORMAL HIGH (ref 98–192)

## 2023-07-01 MED ORDER — SCOPOLAMINE 1 MG/3DAYS TD PT72: 1.0000 | MEDICATED_PATCH | TRANSDERMAL | 1 refills | Status: AC

## 2023-07-01 MED ORDER — MONTELUKAST SODIUM 10 MG PO TABS: 10.0000 mg | ORAL_TABLET | Freq: Every day | ORAL | 3 refills | Status: AC

## 2023-07-01 MED ORDER — LIDOCAINE-PRILOCAINE 2.5-2.5 % EX CREA: 1.0000 | TOPICAL_CREAM | CUTANEOUS | 0 refills | Status: AC | PRN

## 2023-07-01 NOTE — Progress Notes (Signed)
Hematology and Oncology Follow Up Visit  Tami [name] [id number] 07-13-1956 67 y.o. 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  She seems to be managing fairly well.  She actually looks quite good.  Her friend from [place] came down to be with her.  She had  a bone marrow test done 2 days ago.  We do not have the results back yet.  She is complaining of some back discomfort.  She is post have a PET scan done.  However, the insurance is not approved this yet.  We will go ahead and get some plain films of her lower back.  She did have myeloma studies done when we last saw her, her M spike was 2.2 g/dL.  The IgG level was 4000 mg/dL.  Her Kappa light chain was 7.7 mg/dL.  She is having no diarrhea.  She is not dehydrated.  Every now and then, she comes into our office for IV fluids..  She had a Port-A-Cath placed.  I am so happy that she had this done.  She really had very poor IV access.  I am just happy that her quality of life seems doing better right now.  Overall, I would say that her performance status is probably ECOG 1.    Medications:  Allergies as of 07/01/2023       Reactions   Benazepril Anaphylaxis, Swelling, Hives   angioedema Throat and lip swelling   Ondansetron Hcl Hives   Redness and hives post IV admin on 07/05/17   Codeine Nausea And Vomiting   Morphine Hives   Redness and hives noted post IV admin on 07/05/17        Medication List        Accurate as of July 01, 2023 10:58 AM. If  you have any questions, ask your nurse or doctor.          STOP taking these medications    amoxicillin-clavulanate 875-125 MG tablet Commonly known as: AUGMENTIN Stopped by: Josph Macho   fluconazole 150 MG tablet Commonly known as: Diflucan Stopped by: Josph Macho   Fusion Plus Caps Stopped by: Josph Macho   meloxicam 15 MG tablet Commonly known as: MOBIC Stopped by: Josph Macho       TAKE these medications    acyclovir 400 MG tablet Commonly known as: ZOVIRAX Take 1 tablet (400 mg total) by mouth 2 (two) times daily.   albuterol 108 (90 Base) MCG/ACT inhaler Commonly known as: VENTOLIN HFA Inhale 2 puffs into the lungs every 6 (six) hours as needed for wheezing or shortness of breath.   albuterol (2.5 MG/3ML) 0.083% nebulizer solution Commonly known as: PROVENTIL Take 3 mLs (2.5 mg total) by nebulization every 6 (six) hours as needed for wheezing or shortness of breath.   Align 4 MG Caps Take 1 capsule (4 mg total) by mouth daily.   ALPRAZolam 0.25 MG tablet Commonly known as: XANAX Take 1-2 tablets (0.25-0.5 mg total) by mouth 3 (  three) times daily as needed for anxiety.   amLODipine 5 MG tablet Commonly known as: NORVASC Take 1 tablet (5 mg total) by mouth daily.   aspirin EC 81 MG tablet Take 81 mg by mouth daily. Swallow whole.   budesonide 0.5 MG/2ML nebulizer solution Commonly known as: Pulmicort Take 2 mLs (0.5 mg total) by nebulization 2 (two) times daily as needed (cough, wheezing or shortness of breath).   celecoxib 100 MG capsule Commonly known as: CELEBREX Take 100 mg by mouth 2 (two) times daily.   cetirizine 10 MG tablet Commonly known as: ZYRTEC Take 10 mg by mouth at bedtime.   cholestyramine light 4 g packet Commonly known as: PREVALITE Take 4 g by mouth 2 (two) times daily.   dicyclomine 10 MG capsule Commonly known as: BENTYL Take 1 capsule (10 mg total) by mouth 4 (four) times daily -  before meals and at  bedtime. What changed: additional instructions   diphenoxylate-atropine 2.5-0.025 MG tablet Commonly known as: Lomotil Take 1 tablet by mouth 2 (two) times daily.   FeroSul 325 (65 Fe) MG tablet Generic drug: ferrous sulfate TAKE 1 TABLET BY MOUTH DAILY WITH BREAKFAST   fluticasone 50 MCG/ACT nasal spray Commonly known as: FLONASE Place 2 sprays into both nostrils daily.   furosemide 20 MG tablet Commonly known as: LASIX Take 20 mg by mouth daily as needed (fluid retention (feet swelling)).   hydrOXYzine 10 MG tablet Commonly known as: ATARAX TAKE ONE (1) TABLET BY MOUTH 3 TIMES DAILY AS NEEDED FOR ITCHING   hyoscyamine 0.125 MG SL tablet Commonly known as: LEVSIN SL Place 1 tablet (0.125 mg total) under the tongue every 4 (four) hours as needed.   loperamide 2 MG capsule Commonly known as: IMODIUM Take 4 mg by mouth in the morning and at bedtime.   losartan 50 MG tablet Commonly known as: COZAAR Take 50 mg by mouth in the morning and at bedtime.   Magnesium Glycinate 100 MG Caps Take 200 mg by mouth at bedtime.   meclizine 12.5 MG tablet Commonly known as: ANTIVERT Take 12.5 mg by mouth 2 (two) times daily as needed for dizziness or nausea.   metoprolol succinate 50 MG 24 hr tablet Commonly known as: TOPROL-XL Take 1 tablet (50 mg total) by mouth 2 (two) times daily. What changed: when to take this   nortriptyline 10 MG capsule Commonly known as: Pamelor Take 1 capsule (10 mg total) by mouth at bedtime.   oxyCODONE-acetaminophen 5-325 MG tablet Commonly known as: PERCOCET/ROXICET Take 1 tablet by mouth every 6 (six) hours as needed for severe pain.   potassium chloride SA 20 MEQ tablet Commonly known as: KLOR-CON M Take 1 tablet (20 mEq total) by mouth daily.   prochlorperazine 10 MG tablet Commonly known as: COMPAZINE Take 0.5 tablets (5 mg total) by mouth 2 (two) times daily as needed for nausea or vomiting.   Quintabs Tabs Take 1 tablet by mouth  daily.   scopolamine 1 MG/3DAYS Commonly known as: TRANSDERM-SCOP Place 1 patch (1.5 mg total) onto the skin every 3 (three) days.   sertraline 50 MG tablet Commonly known as: ZOLOFT Take 1 tablet (50 mg total) by mouth daily.   spironolactone 25 MG tablet Commonly known as: ALDACTONE Take 12.5 mg by mouth daily.   Vitamin D3 50 MCG (2000 UT) Tabs Take 2,000 Units by mouth in the morning.        Allergies:  Allergies  Allergen Reactions   Benazepril Anaphylaxis, Swelling  and Hives    angioedema Throat and lip swelling   Ondansetron Hcl Hives    Redness and hives post IV admin on 07/05/17   Codeine Nausea And Vomiting   Morphine Hives    Redness and hives noted post IV admin on 07/05/17    Past Medical History, Surgical history, Social history, and Family History were reviewed and updated.  Review of Systems: Review of Systems  Constitutional: Negative.   HENT: Negative.    Eyes: Negative.   Respiratory: Negative.    Cardiovascular: Negative.   Gastrointestinal: Negative.   Genitourinary: Negative.   Musculoskeletal: Negative.   Skin: Negative.   Neurological: Negative.   Endo/Heme/Allergies: Negative.   Psychiatric/Behavioral: Negative.        Physical Exam: Temperature is 97.8.  Pulse 89.  Blood pressure 140/83.  Weight is 189 pounds.   Wt Readings from Last 3 Encounters:  07/01/23 189 lb (85.7 kg)  06/24/23 188 lb (85.3 kg)  06/14/23 189 lb 12.8 oz (86.1 kg)    Physical Exam Vitals reviewed.  HENT:     Head: Normocephalic and atraumatic.  Eyes:     Pupils: Pupils are equal, round, and reactive to light.     Comments: Mild pallor of conjunctiva  Cardiovascular:     Rate and Rhythm: Normal rate and regular rhythm.     Heart sounds: Normal heart sounds.  Pulmonary:     Effort: Pulmonary effort is normal.     Breath sounds: Normal breath sounds.  Abdominal:     General: Bowel sounds are normal.     Palpations: Abdomen is soft.   Musculoskeletal:        General: No tenderness or deformity. Normal range of motion.     Cervical back: Normal range of motion.  Lymphadenopathy:     Cervical: No cervical adenopathy.  Skin:    General: Skin is warm and dry.     Findings: No erythema or rash.  Neurological:     Mental Status: She is alert and oriented to person, place, and time.  Psychiatric:        Behavior: Behavior normal.        Thought Content: Thought content normal.        Judgment: Judgment normal.      Lab Results  Component Value Date   WBC 2.4 (L) 07/01/2023   HGB 12.0 07/01/2023   HCT 38.6 07/01/2023   MCV 91.7 07/01/2023   PLT 101 (L) 07/01/2023   Lab Results  Component Value Date   FERRITIN 288 06/24/2023   IRON 86 06/24/2023   TIBC 335 06/24/2023   UIBC 249 06/24/2023   IRONPCTSAT 26 06/24/2023   Lab Results  Component Value Date   RETICCTPCT 1.2 11/27/2022   RBC 4.21 07/01/2023   RETICCTABS 32,640 07/16/2016   Lab Results  Component Value Date   KPAFRELGTCHN 76.8 (H) 06/24/2023   LAMBDASER 5.8 06/24/2023   KAPLAMBRATIO 13.24 (H) 06/24/2023   Lab Results  Component Value Date   IGGSERUM 3,985 (H) 06/24/2023   IGA 192 06/24/2023   IGMSERUM 140 06/24/2023   Lab Results  Component Value Date   TOTALPROTELP 8.6 (H) 05/21/2023   ALBUMINELP 3.2 05/21/2023   A1GS 0.3 05/21/2023   A2GS 1.0 05/21/2023   BETS 1.2 05/21/2023   BETA2SER 0.5 07/16/2016   GAMS 2.9 (H) 05/21/2023   MSPIKE 2.2 (H) 05/21/2023   SPEI Comment 10/27/2022     Chemistry      Component Value Date/Time  NA 141 06/24/2023 0832   NA 143 11/08/2017 1127   NA 140 12/31/2016 1000   K 4.0 06/24/2023 0832   K 4.0 11/08/2017 1127   K 3.6 12/31/2016 1000   CL 106 06/24/2023 0832   CL 107 11/08/2017 1127   CO2 26 06/24/2023 0832   CO2 25 11/08/2017 1127   CO2 24 12/31/2016 1000   BUN 20 06/24/2023 0832   BUN 19 11/08/2017 1127   BUN 22.3 12/31/2016 1000   CREATININE 0.83 06/24/2023 0832   CREATININE  0.9 11/08/2017 1127   CREATININE 0.8 12/31/2016 1000      Component Value Date/Time   CALCIUM 9.6 06/24/2023 0832   CALCIUM 9.9 11/08/2017 1127   CALCIUM 9.5 12/31/2016 1000   ALKPHOS 40 06/24/2023 0832   ALKPHOS 44 11/08/2017 1127   ALKPHOS 51 12/31/2016 1000   AST 25 06/24/2023 0832   AST 28 12/31/2016 1000   ALT 14 06/24/2023 0832   ALT 36 11/08/2017 1127   ALT 16 12/31/2016 1000   BILITOT 0.4 06/24/2023 0832   BILITOT 0.46 12/31/2016 1000       Impression and Plan: Tami [name] is a very pleasant 67 yo African American female with a previous IgG kappa MGUS with progression to myeloma.  Everything is being set up for treatment.  Of note, that she we will be going on a cruise on September 22.  Regarding have to adjust our schedule for the first cycle a little bit.  I want to make sure that she is able to go on the cruise and not be sick.  I will have her come back when she starts her first cycle of treatment.  I know that she we will do well for the next couple weeks.  I want her to have a wonderful Labor Day weekend.  I am just happy that she is feeling much better right now.    Josph Macho, MD 8/22/202410:58 AM

## 2023-07-02 LAB — IGG, IGA, IGM
IgA: 194 mg/dL (ref 87–352)
IgG (Immunoglobin G), Serum: 3964 mg/dL — ABNORMAL HIGH (ref 586–1602)
IgM (Immunoglobulin M), Srm: 135 mg/dL (ref 26–217)

## 2023-07-05 ENCOUNTER — Inpatient Hospital Stay: Payer: 59

## 2023-07-05 ENCOUNTER — Encounter (HOSPITAL_COMMUNITY): Payer: Self-pay | Admitting: Hematology & Oncology

## 2023-07-05 DIAGNOSIS — Z8639 Personal history of other endocrine, nutritional and metabolic disease: Secondary | ICD-10-CM | POA: Diagnosis not present

## 2023-07-05 DIAGNOSIS — C9000 Multiple myeloma not having achieved remission: Secondary | ICD-10-CM

## 2023-07-05 DIAGNOSIS — R197 Diarrhea, unspecified: Secondary | ICD-10-CM | POA: Diagnosis not present

## 2023-07-05 LAB — KAPPA/LAMBDA LIGHT CHAINS
Kappa free light chain: 64.9 mg/L — ABNORMAL HIGH (ref 3.3–19.4)
Kappa, lambda light chain ratio: 7.91 — ABNORMAL HIGH (ref 0.26–1.65)
Lambda free light chains: 8.2 mg/L (ref 5.7–26.3)

## 2023-07-07 ENCOUNTER — Telehealth: Payer: Self-pay | Admitting: *Deleted

## 2023-07-07 LAB — UPEP/UIFE/LIGHT CHAINS/TP, 24-HR UR
% BETA, Urine: 30.6 %
ALPHA 1 URINE: 5.3 %
Albumin, U: 20.3 %
Alpha 2, Urine: 18.1 %
Free Kappa Lt Chains,Ur: 50.58 mg/L (ref 1.17–86.46)
Free Kappa/Lambda Ratio: 7.22 (ref 1.83–14.26)
Free Lambda Lt Chains,Ur: 7.01 mg/L (ref 0.27–15.21)
GAMMA GLOBULIN URINE: 25.8 %
M-SPIKE %, Urine: 5.7 % — ABNORMAL HIGH
M-Spike, Mg/24 Hr: 23 mg/(24.h) — ABNORMAL HIGH
Total Protein, Urine-Ur/day: 396 mg/(24.h) — ABNORMAL HIGH (ref 30–150)
Total Protein, Urine: 19.8 mg/dL
Total Volume: 2000

## 2023-07-07 NOTE — Telephone Encounter (Signed)
Patient called front office asking when she can start physical rehabilitation after her port insertion and BM biopsy.  Dr Myna Hidalgo stated that she can start anytime.  Called [name] to relay this message to patient.

## 2023-07-08 ENCOUNTER — Ambulatory Visit (INDEPENDENT_AMBULATORY_CARE_PROVIDER_SITE_OTHER): Payer: 59 | Admitting: *Deleted

## 2023-07-08 ENCOUNTER — Encounter: Payer: Self-pay | Admitting: Hematology & Oncology

## 2023-07-08 ENCOUNTER — Ambulatory Visit: Payer: Self-pay

## 2023-07-08 VITALS — BP 157/96 | HR 87 | Temp 98.0°F | Ht 66.0 in | Wt 190.0 lb

## 2023-07-08 DIAGNOSIS — Z0000 Encounter for general adult medical examination without abnormal findings: Secondary | ICD-10-CM | POA: Diagnosis not present

## 2023-07-08 LAB — PROTEIN ELECTROPHORESIS, SERUM, WITH REFLEX
A/G Ratio: 0.6 — ABNORMAL LOW (ref 0.7–1.7)
Albumin ELP: 3.4 g/dL (ref 2.9–4.4)
Alpha-1-Globulin: 0.3 g/dL (ref 0.0–0.4)
Alpha-2-Globulin: 1 g/dL (ref 0.4–1.0)
Beta Globulin: 1.2 g/dL (ref 0.7–1.3)
Gamma Globulin: 3.3 g/dL — ABNORMAL HIGH (ref 0.4–1.8)
Globulin, Total: 5.8 g/dL — ABNORMAL HIGH (ref 2.2–3.9)
M-Spike, %: 2.7 g/dL — ABNORMAL HIGH
SPEP Interpretation: 0
Total Protein ELP: 9.2 g/dL — ABNORMAL HIGH (ref 6.0–8.5)

## 2023-07-08 LAB — IMMUNOFIXATION REFLEX, SERUM
IgA: 198 mg/dL (ref 87–352)
IgG (Immunoglobin G), Serum: 4402 mg/dL — ABNORMAL HIGH (ref 586–1602)
IgM (Immunoglobulin M), Srm: 133 mg/dL (ref 26–217)

## 2023-07-08 NOTE — Progress Notes (Signed)
Subjective:   Tami [name] is a 67 y.o. female who presents for Medicare Annual (Subsequent) preventive examination.  Visit Complete: Virtual  I connected with  Tami [name] on 07/08/23 by a audio enabled telemedicine application and verified that I am speaking with the correct person using two identifiers.  Patient Location: Home  Provider Location: Office/Clinic  I discussed the limitations of evaluation and management by telemedicine. The patient expressed understanding and agreed to proceed.   Review of Systems     Cardiac Risk Factors include: advanced age (>24men, >71 women);dyslipidemia;hypertension;obesity (BMI >30kg/m2)     Objective:   Vital Signs: Vital signs are patient reported.  Today's Vitals   07/08/23 1405 07/08/23 1406  BP: (!) 152/88 (!) 157/96  Pulse:  87  Temp: 98 F (36.7 C)   Weight: 190 lb (86.2 kg)   Height: 5\' 6"  (1.676 m)    Body mass index is 30.67 kg/m.     07/08/2023    1:54 PM 07/01/2023   10:35 AM 06/29/2023   10:28 AM 06/24/2023    9:28 AM 06/18/2023    8:14 AM 06/11/2023   12:02 PM 05/23/2023    7:56 PM  Advanced Directives  Does Patient Have a Medical Advance Directive? Yes Yes Yes Yes No No Yes  Type of Estate agent of [place];Living will Healthcare Power of [place];Living will Healthcare Power of [place];Living will Healthcare Power of [place];Living will Healthcare Power of [place];Living will Living will;Healthcare Power of [employer] Power of [place];Living will  Does patient want to make changes to medical advance directive? No - Patient declined  No - Patient declined No - Patient declined  No - Patient declined   Copy of Healthcare Power of Attorney in Chart? No - copy requested Yes - validated most recent copy scanned in chart (See row information) Yes - validated most recent copy scanned in chart (See row information) No - copy requested Yes - validated most recent copy scanned in chart  (See row information) No - copy requested   Would patient like information on creating a medical advance directive?  No - Patient declined No - Patient declined No - Patient declined  No - Patient declined No - Patient declined    Current Medications (verified) Outpatient Encounter Medications as of 07/08/2023  Medication Sig   acyclovir (ZOVIRAX) 400 MG tablet Take 1 tablet (400 mg total) by mouth 2 (two) times daily.   albuterol (PROVENTIL) (2.5 MG/3ML) 0.083% nebulizer solution Take 3 mLs (2.5 mg total) by nebulization every 6 (six) hours as needed for wheezing or shortness of breath.   albuterol (VENTOLIN HFA) 108 (90 Base) MCG/ACT inhaler Inhale 2 puffs into the lungs every 6 (six) hours as needed for wheezing or shortness of breath.   ALPRAZolam (XANAX) 0.25 MG tablet Take 1-2 tablets (0.25-0.5 mg total) by mouth 3 (three) times daily as needed for anxiety.   amLODipine (NORVASC) 5 MG tablet Take 1 tablet (5 mg total) by mouth daily. (Patient not taking: Reported on 07/08/2023)   aspirin EC 81 MG tablet Take 81 mg by mouth daily. Swallow whole.   budesonide (PULMICORT) 0.5 MG/2ML nebulizer solution Take 2 mLs (0.5 mg total) by nebulization 2 (two) times daily as needed (cough, wheezing or shortness of breath).   celecoxib (CELEBREX) 100 MG capsule Take 100 mg by mouth 2 (two) times daily.   cetirizine (ZYRTEC) 10 MG tablet Take 10 mg by mouth at bedtime.   Cholecalciferol (VITAMIN D3) 50  MCG (2000 UT) TABS Take 2,000 Units by mouth in the morning.   cholestyramine light (PREVALITE) 4 g packet Take 4 g by mouth 2 (two) times daily.   dicyclomine (BENTYL) 10 MG capsule Take 1 capsule (10 mg total) by mouth 4 (four) times daily -  before meals and at bedtime. (Patient taking differently: Take 10 mg by mouth 4 (four) times daily -  before meals and at bedtime. cramping)   diphenoxylate-atropine (LOMOTIL) 2.5-0.025 MG tablet Take 1 tablet by mouth 2 (two) times daily.   ferrous sulfate (FEROSUL)  325 (65 FE) MG tablet TAKE 1 TABLET BY MOUTH DAILY WITH BREAKFAST   fluticasone (FLONASE) 50 MCG/ACT nasal spray Place 2 sprays into both nostrils daily.   furosemide (LASIX) 20 MG tablet Take 20 mg by mouth daily as needed (fluid retention (feet swelling)).   hydrOXYzine (ATARAX) 10 MG tablet TAKE ONE (1) TABLET BY MOUTH 3 TIMES DAILY AS NEEDED FOR ITCHING   hyoscyamine (LEVSIN SL) 0.125 MG SL tablet Place 1 tablet (0.125 mg total) under the tongue every 4 (four) hours as needed.   lidocaine-prilocaine (EMLA) cream Apply 1 Application topically as needed.   loperamide (IMODIUM) 2 MG capsule Take 4 mg by mouth in the morning and at bedtime.   losartan (COZAAR) 50 MG tablet Take 50 mg by mouth in the morning and at bedtime.   Magnesium Glycinate 100 MG CAPS Take 200 mg by mouth at bedtime.   meclizine (ANTIVERT) 12.5 MG tablet Take 12.5 mg by mouth 2 (two) times daily as needed for dizziness or nausea.   metoprolol succinate (TOPROL-XL) 50 MG 24 hr tablet Take 1 tablet (50 mg total) by mouth 2 (two) times daily. (Patient taking differently: Take 50 mg by mouth daily.)   montelukast (SINGULAIR) 10 MG tablet Take 1 tablet (10 mg total) by mouth at bedtime. Please start taking this on September 4.   Multiple Vitamin (QUINTABS) TABS Take 1 tablet by mouth daily. (Patient not taking: Reported on 07/08/2023)   Multiple Vitamins-Minerals (CENTRUM SILVER WOMEN 50+ PO) Take 1 tablet by mouth daily at 6 (six) AM.   nortriptyline (PAMELOR) 10 MG capsule Take 1 capsule (10 mg total) by mouth at bedtime.   oxyCODONE-acetaminophen (PERCOCET/ROXICET) 5-325 MG tablet Take 1 tablet by mouth every 6 (six) hours as needed for severe pain.   potassium chloride SA (KLOR-CON M) 20 MEQ tablet Take 1 tablet (20 mEq total) by mouth daily.   Probiotic Product (ALIGN) 4 MG CAPS Take 1 capsule (4 mg total) by mouth daily. (Patient not taking: Reported on 07/08/2023)   prochlorperazine (COMPAZINE) 10 MG tablet Take 0.5 tablets (5  mg total) by mouth 2 (two) times daily as needed for nausea or vomiting.   Saccharomyces boulardii (FLORASTOR PO) Take 500 mg by mouth daily at 6 (six) AM.   scopolamine (TRANSDERM-SCOP) 1 MG/3DAYS Place 1 patch (1.5 mg total) onto the skin every 3 (three) days.   scopolamine (TRANSDERM-SCOP) 1 MG/3DAYS Place 1 patch (1.5 mg total) onto the skin every 3 (three) days.   sertraline (ZOLOFT) 50 MG tablet Take 1 tablet (50 mg total) by mouth daily.   spironolactone (ALDACTONE) 25 MG tablet Take 12.5 mg by mouth daily.   Facility-Administered Encounter Medications as of 07/08/2023  Medication   nitroGLYCERIN (NITROSTAT) SL tablet 0.4 mg    Allergies (verified) Benazepril, Ondansetron hcl, Codeine, and Morphine   History: Past Medical History:  Diagnosis Date   Abnormal SPEP 07/26/2016   Arthritis  knees, hands   Back pain 07/14/2016   Bowel obstruction (HCC) 11/2014   C. 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   Multiple myeloma (HCC) 07/20/2018   Multiple myeloma not having achieved remission (HCC) 07/20/2018   Myalgia 12/31/2016   Obstructive sleep apnea 03/31/2015   Presence of permanent cardiac pacemaker    SOB (shortness of breath) 04/09/2016   Stroke (HCC)    TIAs   UTI (urinary tract infection)    Past Surgical History:  Procedure Laterality Date   ABDOMINAL HYSTERECTOMY     menorraghia, 2006, total   BIOPSY  09/01/2022   Procedure: BIOPSY;  Surgeon: Lynann Bologna, MD;  Location: [facility];  Service:  Gastroenterology;;   CHOLECYSTECTOMY     COLONOSCOPY  2018   [facility] per patient   COLONOSCOPY WITH PROPOFOL N/A 09/01/2022   Procedure: COLONOSCOPY WITH PROPOFOL;  Surgeon: Lynann Bologna, MD;  Location: [facility];  Service: Gastroenterology;  Laterality: N/A;   ESOPHAGOGASTRODUODENOSCOPY  2018   [facility]    ESOPHAGOGASTRODUODENOSCOPY (EGD) WITH PROPOFOL N/A 09/01/2022   Procedure: ESOPHAGOGASTRODUODENOSCOPY (EGD) WITH PROPOFOL;  Surgeon: Lynann Bologna, MD;  Location: [facility];  Service: Gastroenterology;  Laterality: N/A;   IR IMAGING GUIDED PORT INSERTION  06/29/2023   KNEE SURGERY     right, repair torn torn cartialage   PACEMAKER INSERTION  08/2014   changed last in 2023 [facility] at [facility]   TONSILLECTOMY     TUBAL LIGATION     Family History  Problem Relation Age of Onset   Diabetes Mother    Hypertension Mother    Heart disease Mother        s/p 1 stent   Hyperlipidemia Mother    Arthritis Mother    Cancer Father        COLON   Colon cancer Father 74   Irritable bowel syndrome Sister    Hyperlipidemia Daughter    Hypertension Daughter    Hypertension Maternal Grandmother    Arthritis Maternal Grandmother    Heart disease Maternal Grandfather        MI   Hypertension Maternal Grandfather    Arthritis Maternal Grandfather    Hypertension Son    Esophageal cancer Neg Hx    Social History   Socioeconomic History   Marital status: Divorced    Spouse name: [name]   Number of children: 3   Years of education: [name]   Highest education level: High school graduate  Occupational History   [name]  Tobacco Use   Smoking status: Never    Passive exposure: Never   Smokeless tobacco: Never  Vaping Use   Vaping status: Never Used  Substance and Sexual Activity   Alcohol use: No   Drug use: No   Sexual activity: Not Currently  Other Topics Concern   [name]  Social History Narrative   Lives at home alone    Retired   Caffeine: coffee   Social Determinants  of Health   Financial Resource Strain: Medium Risk (07/08/2023)   Overall Financial Resource Strain (CARDIA)    Difficulty of Paying Living Expenses: Somewhat hard  Food Insecurity: No Food Insecurity (03/28/2023)   Hunger Vital Sign    Worried About Running Out of Food in the Last Year: Never true    Ran Out of Food in the Last Year: Never true  Transportation Needs: No Transportation Needs (03/28/2023)   PRAPARE - Administrator, Civil Service (Medical): No    Lack of Transportation (Non-Medical): No  Physical Activity: Inactive (07/08/2023)   Exercise Vital Sign    Days of Exercise per Week: 0 days    Minutes of Exercise per Session: 0 min  Stress: No Stress Concern Present (01/21/2023)   [employer] of Occupational Health - Occupational Stress Questionnaire    Feeling of Stress : Not at all  Social Connections: Moderately Isolated (07/08/2023)   Social Connection and Isolation Panel [NHANES]    Frequency of Communication with Friends and Family: More than three times a week    Frequency of Social Gatherings with Friends and Family: Twice a week    Attends Religious Services: More than 4 times per year    Active Member of [employer] or Organizations: No    Attends Engineer, structural: Never    Marital Status: Divorced    Tobacco Counseling Counseling given: Not Answered   Clinical Intake:  Pre-visit preparation completed: Yes  Pain : No/denies pain  BMI - recorded: 30.67 Nutritional Status: BMI > 30  Obese Nutritional Risks: None Diabetes: No  How often do you need to have someone help you when you read instructions, pamphlets, or other written materials from your doctor or pharmacy?: 1 - Never  Interpreter Needed?: No  Information entered by :: [employer], CMA   Activities of Daily Living    07/08/2023    1:47 PM 03/28/2023    1:07 AM  In your present state of health, do you have any  difficulty performing the following activities:  Hearing? 0 0  Vision? 0 0  Difficulty concentrating or making decisions? 0 0  Walking or climbing stairs? 0 1  Dressing or bathing? 0 1  Doing errands, shopping? 0 0  Preparing Food and eating ? N   Using the Toilet? N   In the past six months, have you accidently leaked urine? N   Do you have problems with loss of bowel control? N   Managing your Medications? N   Managing your Finances? N   Housekeeping or managing your Housekeeping? Y   Comment has someone that helps with housework     Patient Care Team: [name], MD as PCP - General (Family Medicine) Myna Hidalgo Rose Phi, MD as Consulting Physician (Oncology) Belva Chimes, MD as Referring Physician (Specialist) Soundra Pilon, LCSW as Social Worker (Licensed Clinical Social Worker) Colletta Maryland, RN as [facility] Care Management  Indicate any recent Medical Services you may have received from other than [facility] providers in the past year (date may be approximate).     Assessment:   This is a routine wellness examination for Tami [name].  Hearing/Vision screen No results found.  Dietary issues and exercise activities discussed:     Goals Addressed   None    Depression Screen    07/08/2023    2:00 PM 06/14/2023    9:37 AM 05/28/2023    9:02 AM 04/27/2023   10:08 AM 03/16/2023  2:29 PM 12/24/2022    1:39 PM 12/15/2022    1:49 PM  PHQ 2/9 Scores  PHQ - 2 Score 0 0 0 0 0 0 0  PHQ- 9 Score  0  0   0    Fall Risk    07/08/2023    1:54 PM 06/14/2023    9:37 AM 05/28/2023    9:02 AM 04/27/2023   10:08 AM 12/24/2022    1:39 PM  Fall Risk   Falls in the past year? 1 0 0 0 0  Number falls in past yr: 1 0 0 0 0  Injury with Fall? 0 0 0 0 0  Risk for fall due to : Impaired balance/gait      Follow up Falls evaluation completed Falls evaluation completed  Falls evaluation completed     MEDICARE RISK AT HOME: Medicare Risk at Home Any stairs in or  around the home?: No If so, are there any without handrails?: No Home free of loose throw rugs in walkways, pet beds, electrical cords, etc?: Yes Adequate lighting in your home to reduce risk of falls?: Yes Life alert?: No Use of a cane, walker or w/c?: Yes Grab bars in the bathroom?: Yes Shower chair or bench in shower?: No Elevated toilet seat or a handicapped toilet?: No  TIMED UP AND GO:  Was the test performed?  No    Cognitive Function:        07/08/2023    2:00 PM  6CIT Screen  What Year? 0 points  What month? 0 points  What time? 0 points  Count back from 20 0 points  Months in reverse 0 points  Repeat phrase 0 points  Total Score 0 points    Immunizations Immunization History  Administered Date(s) Administered   COVID-19, mRNA, vaccine(Comirnaty)12 years and older 09/08/2022   Fluad Quad(high Dose 65+) 07/13/2019, 07/30/2020, 08/07/2021, 08/25/2022   Influenza Split 07/14/2016, 07/27/2017   Influenza Whole 09/09/2009, 09/12/2013, 07/12/2015   Influenza,inj,Quad PF,6+ Mos 07/14/2016, 07/27/2017, 07/07/2018   Influenza-Unspecified 09/09/2009, 09/12/2013, 08/23/2014, 07/06/2015, 07/14/2016, 07/27/2017, 07/07/2018   PFIZER(Purple Top)SARS-COV-2 Vaccination 12/15/2019, 01/05/2020, 11/15/2020, 03/17/2021   Pneumococcal Conjugate-13 07/12/2015, 06/02/2021   Pneumococcal Polysaccharide-23 09/06/2014, 07/12/2015   Pneumococcal-Unspecified 07/06/2015   Tdap 12/01/2010, 10/11/2015   Zoster Recombinant(Shingrix) 09/15/2022, 01/25/2023   Zoster, Live 10/11/2015, 09/15/2016    TDAP status: Up to date  Flu Vaccine status: Due, Education has been provided regarding the importance of this vaccine. Advised may receive this vaccine at local pharmacy or Health Dept. Aware to provide a copy of the vaccination record if obtained from local pharmacy or Health Dept. Verbalized acceptance and understanding.  Pneumococcal vaccine status: Due, Education has been provided regarding  the importance of this vaccine. Advised may receive this vaccine at local pharmacy or Health Dept. Aware to provide a copy of the vaccination record if obtained from local pharmacy or Health Dept. Verbalized acceptance and understanding.  Covid-19 vaccine status: Information provided on how to obtain vaccines.   Qualifies for Shingles Vaccine? Yes   Zostavax completed Yes   Shingrix Completed?: Yes  Screening Tests Health Maintenance  Topic Date Due   Pneumonia Vaccine 42+ Years old (4 of 4 - PPSV23 or PCV20) 06/02/2022   COVID-19 Vaccine (6 - 2023-24 season) 11/03/2022   INFLUENZA VACCINE  02/07/2024 (Originally 06/10/2023)   Medicare Annual Wellness (AWV)  12/05/2023   MAMMOGRAM  04/02/2024   DTaP/Tdap/Td (3 - Td or Tdap) 10/10/2025   Colonoscopy  09/01/2032   DEXA  SCAN  Completed   Hepatitis C Screening  Completed   Zoster Vaccines- Shingrix  Completed   HPV VACCINES  Aged Out    Health Maintenance  Health Maintenance Due  Topic Date Due   Pneumonia Vaccine 66+ Years old (4 of 4 - PPSV23 or PCV20) 06/02/2022   COVID-19 Vaccine (6 - 2023-24 season) 11/03/2022    Colorectal cancer screening: Type of screening: Colonoscopy. Completed 09/01/22. Repeat every 10 years  Mammogram status: Completed 04/02/22. Repeat every year  Bone Density status: Completed 11/06/21. Results reflect: Bone density results: OSTEOPOROSIS. Repeat every 2 years.  Lung Cancer Screening: (Low Dose CT Chest recommended if Age 50-80 years, 20 pack-year currently smoking OR have quit w/in 15years.) does not qualify.   Additional Screening:  Hepatitis C Screening: does qualify; Completed 10/11/15  Vision Screening: Recommended annual ophthalmology exams for early detection of glaucoma and other disorders of the eye. Is the patient up to date with their annual eye exam?  Yes  Who is the provider or what is the name of the office in which the patient attends annual eye exams? Dr.Brian Kosobucki If pt is not  established with a provider, would they like to be referred to a provider to establish care? No .   Dental Screening: Recommended annual dental exams for proper oral hygiene  Diabetic Foot Exam: N/a  Community Resource Referral / Chronic Care Management: CRR required this visit?  No   CCM required this visit?  No     Plan:     I have personally reviewed and noted the following in the patient's chart:   Medical and social history Use of alcohol, tobacco or illicit drugs  Current medications and supplements including opioid prescriptions. Patient is not currently taking opioid prescriptions. Functional ability and status Nutritional status Physical activity Advanced directives List of other physicians Hospitalizations, surgeries, and ER visits in previous 12 months Vitals Screenings to include cognitive, depression, and falls Referrals and appointments  In addition, I have reviewed and discussed with patient certain preventive protocols, quality metrics, and best practice recommendations. A written personalized care plan for preventive services as well as general preventive health recommendations were provided to patient.     Donne Anon, CMA   07/08/2023   After Visit Summary: (MyChart) Due to this being a telephonic visit, the after visit summary with patients personalized plan was offered to patient via MyChart   Nurse Notes: None

## 2023-07-08 NOTE — Progress Notes (Signed)
Pharmacist Chemotherapy Monitoring - Initial Assessment    Anticipated start date: 07/16/23   The following has been reviewed per standard work regarding the patient's treatment regimen: The patient's diagnosis, treatment plan and drug doses, and organ/hematologic function Lab orders and baseline tests specific to treatment regimen  The treatment plan start date, drug sequencing, and pre-medications Prior authorization status  Patient's documented medication list, including drug-drug interaction screen and prescriptions for anti-emetics and supportive care specific to the treatment regimen The drug concentrations, fluid compatibility, administration routes, and timing of the medications to be used The patient's access for treatment and lifetime cumulative dose history, if applicable  The patient's medication allergies and previous infusion related reactions, if applicable   Changes made to treatment plan:  N/A (order of administration/timing)  Follow up needed:  N/A   Candelaria Stagers, [facility], 07/08/2023  9:48 AM

## 2023-07-08 NOTE — Patient Outreach (Signed)
  Care Coordination   Follow Up Visit Note   07/08/2023 Name: Tami [name] MRN: [id number] DOB: [birth year]  Tami [name] is a 67 y.o. year old female who sees [name], MD for primary care. I spoke with  [name] by phone today.  What matters to the patients health and wellness today?  Reports bone marrow bx and port a cath implant last week. She reports she would like to start going back to the gym and is in the process of getting approval from provider. States she does not have diarrhea now and bowel movements are soft. She expresses would like to change agencies for [facility]. Provided the contact number for [facility] [phone]    Goals Addressed             This Visit's Progress    Assistance with Health Management       Interventions Today    Flowsheet Row Most Recent Value  Chronic Disease   Chronic disease during today's visit Congestive Heart Failure (CHF), Hypertension (HTN), Other  [multiple myeloma]  General Interventions   General Interventions Discussed/Reviewed General Interventions Reviewed, Doctor Visits  Doctor Visits Discussed/Reviewed Doctor Visits Reviewed, PCP  PCP/Specialist Visits Compliance with follow-up visit  [Encouraged to attend provider visits as scheduled/recommended]  Exercise Interventions   Exercise Discussed/Reviewed Physical Activity  Education Interventions   Education Provided Provided Education  Provided Verbal Education On Other, [facility], When to see the doctor  Algis Downs to attend provider visits as scheduled, take medications as scheduled, contact provider with health questions or concerns. Provided the contact number for [facility] [phone] to call regarding Personal care service]  Nutrition Interventions   Nutrition Discussed/Reviewed Nutrition Reviewed  Pharmacy Interventions   Pharmacy Dicussed/Reviewed Pharmacy Topics Reviewed            SDOH assessments and interventions completed:   No  Care Coordination Interventions:  Yes, provided   Follow up plan: Follow up call scheduled for 08/05/23    Encounter Outcome:  Pt. Visit Completed   Kathyrn Sheriff, RN, MSN, BSN, CCM Care Management Coordinator [phone]

## 2023-07-08 NOTE — Patient Instructions (Signed)
Visit Information  Thank you for taking time to visit with me today. Please don't hesitate to contact me if I can be of assistance to you.   Following are the goals we discussed today:  Contact [facility] [phone] regarding Personal care service needs, questions and concerns Continue to take medications as prescribed. Continue to attend provider visits as scheduled Continue to eat healthy, lean meats, vegetables, fruits, avoid saturated and transfats  Our next appointment is by telephone on 08/05/23 at 11:30 am  Please call the care guide team at [phone] if you need to cancel or reschedule your appointment.   If you are experiencing a Mental Health or Behavioral Health Crisis or need someone to talk to, please call the Suicide and Crisis Lifeline: 988 call the [place] National Suicide Prevention Lifeline: 904 427 9064 or TTY: 231-541-5440 TTY 406-700-5341) to talk to a trained counselor call 1-800-273-TALK (toll free, 24 hour hotline)  Kathyrn Sheriff, RN, MSN, BSN, CCM Care Management Coordinator [phone]

## 2023-07-08 NOTE — Patient Instructions (Addendum)
Tami [name] , Thank you for taking time to come for your Medicare Wellness Visit. I appreciate your ongoing commitment to your health goals. Please review the following plan we discussed and let me know if I can assist you in the future.     This is a list of the screening recommended for you and due dates:  Health Maintenance  Topic Date Due   Pneumonia Vaccine (4 of 4 - PPSV23 or PCV20) 06/02/2022   COVID-19 Vaccine (6 - 2023-24 season) 11/03/2022   Flu Shot  02/07/2024*   Mammogram  04/02/2024   Medicare Annual Wellness Visit  07/07/2024   DTaP/Tdap/Td vaccine (3 - Td or Tdap) 10/10/2025   Colon Cancer Screening  09/01/2032   DEXA scan (bone density measurement)  Completed   Hepatitis C Screening  Completed   Zoster (Shingles) Vaccine  Completed   HPV Vaccine  Aged Out  *Topic was postponed. The date shown is not the original due date.    Next appointment: Follow up in one year for your annual wellness visit.   Preventive Care 26 Years and Older, Female Preventive care refers to lifestyle choices and visits with your health care provider that can promote health and wellness. What does preventive care include? A yearly physical exam. This is also called an annual well check. Dental exams once or twice a year. Routine eye exams. Ask your health care provider how often you should have your eyes checked. Personal lifestyle choices, including: Daily care of your teeth and gums. Regular physical activity. Eating a healthy diet. Avoiding tobacco and drug use. Limiting alcohol use. Practicing safe sex. Taking low-dose aspirin every day. Taking vitamin and mineral supplements as recommended by your health care provider. What happens during an annual well check? The services and screenings done by your health care provider during your annual well check will depend on your age, overall health, lifestyle risk factors, and family history of disease. Counseling  Your health care  provider may ask you questions about your: Alcohol use. Tobacco use. Drug use. Emotional well-being. Home and relationship well-being. Sexual activity. Eating habits. History of falls. Memory and ability to understand (cognition). Work and work Astronomer. Reproductive health. Screening  You may have the following tests or measurements: Height, weight, and BMI. Blood pressure. Lipid and cholesterol levels. These may be checked every 5 years, or more frequently if you are over 47 years old. Skin check. Lung cancer screening. You may have this screening every year starting at age 81 if you have a 30-pack-year history of smoking and currently smoke or have quit within the past 15 years. Fecal occult blood test (FOBT) of the stool. You may have this test every year starting at age 54. Flexible sigmoidoscopy or colonoscopy. You may have a sigmoidoscopy every 5 years or a colonoscopy every 10 years starting at age 49. Hepatitis C blood test. Hepatitis B blood test. Sexually transmitted disease (STD) testing. Diabetes screening. This is done by checking your blood sugar (glucose) after you have not eaten for a while (fasting). You may have this done every 1-3 years. Bone density scan. This is done to screen for osteoporosis. You may have this done starting at age 24. Mammogram. This may be done every 1-2 years. Talk to your health care provider about how often you should have regular mammograms. Talk with your health care provider about your test results, treatment options, and if necessary, the need for more tests. Vaccines  Your health care provider may recommend  certain vaccines, such as: Influenza vaccine. This is recommended every year. Tetanus, diphtheria, and acellular pertussis (Tdap, Td) vaccine. You may need a Td booster every 10 years. Zoster vaccine. You may need this after age 50. Pneumococcal 13-valent conjugate (PCV13) vaccine. One dose is recommended after age  27. Pneumococcal polysaccharide (PPSV23) vaccine. One dose is recommended after age 39. Talk to your health care provider about which screenings and vaccines you need and how often you need them. This information is not intended to replace advice given to you by your health care provider. Make sure you discuss any questions you have with your health care provider. Document Released: 11/22/2015 Document Revised: 07/15/2016 Document Reviewed: 08/27/2015 Elsevier Interactive Patient Education  2017 [employer].  Fall Prevention in the Home Falls can cause injuries. They can happen to people of all ages. There are many things you can do to make your home safe and to help prevent falls. What can I do on the outside of my home? Regularly fix the edges of walkways and driveways and fix any cracks. Remove anything that might make you trip as you walk through a door, such as a raised step or threshold. Trim any bushes or trees on the path to your home. Use bright outdoor lighting. Clear any walking paths of anything that might make someone trip, such as rocks or tools. Regularly check to see if handrails are loose or broken. Make sure that both sides of any steps have handrails. Any raised decks and porches should have guardrails on the edges. Have any leaves, snow, or ice cleared regularly. Use sand or salt on walking paths during winter. Clean up any spills in your garage right away. This includes oil or grease spills. What can I do in the bathroom? Use night lights. Install grab bars by the toilet and in the tub and shower. Do not use towel bars as grab bars. Use non-skid mats or decals in the tub or shower. If you need to sit down in the shower, use a plastic, non-slip stool. Keep the floor dry. Clean up any water that spills on the floor as soon as it happens. Remove soap buildup in the tub or shower regularly. Attach bath mats securely with double-sided non-slip rug tape. Do not have throw  rugs and other things on the floor that can make you trip. What can I do in the bedroom? Use night lights. Make sure that you have a light by your bed that is easy to reach. Do not use any sheets or blankets that are too big for your bed. They should not hang down onto the floor. Have a firm chair that has side arms. You can use this for support while you get dressed. Do not have throw rugs and other things on the floor that can make you trip. What can I do in the kitchen? Clean up any spills right away. Avoid walking on wet floors. Keep items that you use a lot in easy-to-reach places. If you need to reach something above you, use a strong step stool that has a grab bar. Keep electrical cords out of the way. Do not use floor polish or wax that makes floors slippery. If you must use wax, use non-skid floor wax. Do not have throw rugs and other things on the floor that can make you trip. What can I do with my stairs? Do not leave any items on the stairs. Make sure that there are handrails on both sides of the  stairs and use them. Fix handrails that are broken or loose. Make sure that handrails are as long as the stairways. Check any carpeting to make sure that it is firmly attached to the stairs. Fix any carpet that is loose or worn. Avoid having throw rugs at the top or bottom of the stairs. If you do have throw rugs, attach them to the floor with carpet tape. Make sure that you have a light switch at the top of the stairs and the bottom of the stairs. If you do not have them, ask someone to add them for you. What else can I do to help prevent falls? Wear shoes that: Do not have high heels. Have rubber bottoms. Are comfortable and fit you well. Are closed at the toe. Do not wear sandals. If you use a stepladder: Make sure that it is fully opened. Do not climb a closed stepladder. Make sure that both sides of the stepladder are locked into place. Ask someone to hold it for you, if  possible. Clearly mark and make sure that you can see: Any grab bars or handrails. First and last steps. Where the edge of each step is. Use tools that help you move around (mobility aids) if they are needed. These include: Canes. Walkers. Scooters. Crutches. Turn on the lights when you go into a dark area. Replace any light bulbs as soon as they burn out. Set up your furniture so you have a clear path. Avoid moving your furniture around. If any of your floors are uneven, fix them. If there are any pets around you, be aware of where they are. Review your medicines with your doctor. Some medicines can make you feel dizzy. This can increase your chance of falling. Ask your doctor what other things that you can do to help prevent falls. This information is not intended to replace advice given to you by your health care provider. Make sure you discuss any questions you have with your health care provider. Document Released: 08/22/2009 Document Revised: 04/02/2016 Document Reviewed: 11/30/2014 Elsevier Interactive Patient Education  2017 [employer].

## 2023-07-09 ENCOUNTER — Encounter: Payer: Self-pay | Admitting: *Deleted

## 2023-07-09 ENCOUNTER — Ambulatory Visit (HOSPITAL_BASED_OUTPATIENT_CLINIC_OR_DEPARTMENT_OTHER)
Admission: RE | Admit: 2023-07-09 | Discharge: 2023-07-09 | Disposition: A | Discharge status: Home / Self Care | Payer: 59 | Source: Ambulatory Visit | Attending: Hematology & Oncology | Admitting: Hematology & Oncology

## 2023-07-09 DIAGNOSIS — C9000 Multiple myeloma not having achieved remission: Secondary | ICD-10-CM | POA: Diagnosis not present

## 2023-07-09 DIAGNOSIS — M545 Low back pain, unspecified: Secondary | ICD-10-CM | POA: Diagnosis not present

## 2023-07-09 DIAGNOSIS — M4316 Spondylolisthesis, lumbar region: Secondary | ICD-10-CM | POA: Diagnosis not present

## 2023-07-09 DIAGNOSIS — M47816 Spondylosis without myelopathy or radiculopathy, lumbar region: Secondary | ICD-10-CM | POA: Diagnosis not present

## 2023-07-09 DIAGNOSIS — Z8579 Personal history of other malignant neoplasms of lymphoid, hematopoietic and related tissues: Secondary | ICD-10-CM | POA: Diagnosis not present

## 2023-07-13 ENCOUNTER — Ambulatory Visit: Payer: Self-pay | Admitting: Licensed Clinical Social Worker

## 2023-07-13 ENCOUNTER — Encounter: Payer: Self-pay | Admitting: Pharmacist

## 2023-07-13 NOTE — Patient Outreach (Signed)
  Care Coordination  Follow Up Visit Note   07/13/2023 Name: Tami [name] MRN: [id number] DOB: January 01, 1956  Tami [name] is a 67 y.o. year old female who sees [name], MD for primary care. I spoke with  Tami [name] by phone today.  What matters to the patients health and wellness today?  Concerns with her PCS aide  Patient was accompanied by her HPOA who provided information during this encounter. Patient is upset with PCS aide not showing up and wanted a new agency .  Patient's HPOA is in agreement to contact the agency to resolve the issue and remain with the same agency. CAPS application has been faxed by PCP's office    Goals Addressed             This Visit's Progress    COMPLETED: CAPS program       Activities and task to complete in order to accomplish goals.   [facility] is currently in place  please contact [place] [facility] [phone] if you have any questions or concerns or want to change agencies Please contact your PCS agency directly if you have concerns with the aide I have verified with Dr. Vena Rua office that the CAPS form has been completed and faxed to [facility].  You should hear from them soon.  They did not send me a copy, however states a copy will be available in the chart within the next few weeks under the media tab.        SDOH assessments and interventions completed:  No   Interventions Today    Flowsheet Row Most Recent Value  Chronic Disease   Chronic disease during today's visit Hypertension (HTN), Congestive Heart Failure (CHF)  General Interventions   General Interventions Discussed/Reviewed Level of Care, Communication with  Communication with PCP/Specialists  [CMA  Ref. completing CAPS application]  Level of Care [facility]  [concerns with PCS aide]  Education Interventions   Education Provided Provided Education  [on barriers with PCS]       Care Coordination Interventions:  Yes, provided   Follow up  plan: No further intervention required.   Encounter Outcome:  Pt. Visit Completed   Tami Hines, LCSW [place]  [facility], [facility] Licensed Clinical Social Work Care Coordinator  Direct Dial: [phone]

## 2023-07-13 NOTE — Patient Instructions (Signed)
Social Work Visit Information  Thank you for taking time to visit with me today. Please don't hesitate to contact me if I can be of assistance to you.   Following are the goals we discussed today:   Goals Addressed             This Visit's Progress    COMPLETED: CAPS program       Activities and task to complete in order to accomplish goals.   [facility] is currently in place  please contact  [facility] [phone] if you have any questions or concerns or want to change agencies Please contact your PCS agency directly if you have concerns with the aide I have verified with Dr. Vena Rua office that the CAPS form has been completed and faxed to [facility].  You should hear from them soon.  They did not send me a copy, however states a copy will be available in the chart within the next few weeks under the media tab.         No follow up call scheduled  Please call the care guide team at [phone] if you need to cancel or reschedule your appointment.   If you or anyone you know are experiencing a Mental Health or Behavioral Health Crisis or need someone to talk to, please call the Suicide and Crisis Lifeline: 988 call the [place] National Suicide Prevention Lifeline: (831)823-9208 or TTY: 203-187-4072 TTY (706) 090-1578) to talk to a trained counselor call 1-800-273-TALK (toll free, 24 hour hotline) go to [facility] [address] [place] [phone])   Patient verbalizes understanding of instructions and care plan provided today and agrees to view in MyChart. Active MyChart status and patient understanding of how to access instructions and care plan via MyChart confirmed with patient.       [name], LCSW [place]  [facility], [facility] Licensed Clinical Social Work Care Coordinator  Direct Dial: [phone]

## 2023-07-14 ENCOUNTER — Other Ambulatory Visit: Payer: Self-pay | Admitting: *Deleted

## 2023-07-14 DIAGNOSIS — C9002 Multiple myeloma in relapse: Secondary | ICD-10-CM

## 2023-07-14 DIAGNOSIS — C9000 Multiple myeloma not having achieved remission: Secondary | ICD-10-CM

## 2023-07-14 MED ORDER — ONDANSETRON HCL 8 MG PO TABS: 8.0000 mg | ORAL_TABLET | Freq: Three times a day (TID) | ORAL | 1 refills | Status: DC | PRN

## 2023-07-14 NOTE — Assessment & Plan Note (Signed)
Supplement and monitor 

## 2023-07-14 NOTE — Assessment & Plan Note (Signed)
Well controlled, no changes to meds. Encouraged heart healthy diet such as the DASH diet and exercise as tolerated.  °

## 2023-07-14 NOTE — Assessment & Plan Note (Signed)
Increase leafy greens, consider increased lean red meat and using cast iron cookware. Continue to monitor, report any concerns 

## 2023-07-14 NOTE — Assessment & Plan Note (Signed)
hgba1c acceptable, minimize simple carbs. Increase exercise as tolerated.  

## 2023-07-14 NOTE — Assessment & Plan Note (Signed)
Encourage heart healthy diet such as MIND or DASH diet, increase exercise, avoid trans fats, simple carbohydrates and processed foods, consider a krill or fish or flaxseed oil cap daily.  °

## 2023-07-14 NOTE — Assessment & Plan Note (Signed)
Is following with oncology

## 2023-07-14 NOTE — Assessment & Plan Note (Signed)
Stable--following with cardiology

## 2023-07-14 NOTE — Assessment & Plan Note (Signed)
She has been following with infectious disease.

## 2023-07-15 ENCOUNTER — Ambulatory Visit (INDEPENDENT_AMBULATORY_CARE_PROVIDER_SITE_OTHER): Payer: 59 | Admitting: Family Medicine

## 2023-07-15 ENCOUNTER — Encounter (HOSPITAL_COMMUNITY): Payer: 59

## 2023-07-15 VITALS — BP 126/82 | HR 97 | Temp 98.0°F | Resp 16 | Ht 66.0 in | Wt 192.0 lb

## 2023-07-15 DIAGNOSIS — A0472 Enterocolitis due to Clostridium difficile, not specified as recurrent: Secondary | ICD-10-CM | POA: Diagnosis not present

## 2023-07-15 DIAGNOSIS — C9000 Multiple myeloma not having achieved remission: Secondary | ICD-10-CM

## 2023-07-15 DIAGNOSIS — I429 Cardiomyopathy, unspecified: Secondary | ICD-10-CM | POA: Diagnosis not present

## 2023-07-15 DIAGNOSIS — D500 Iron deficiency anemia secondary to blood loss (chronic): Secondary | ICD-10-CM | POA: Diagnosis not present

## 2023-07-15 DIAGNOSIS — Z23 Encounter for immunization: Secondary | ICD-10-CM | POA: Diagnosis not present

## 2023-07-15 DIAGNOSIS — D801 Nonfamilial hypogammaglobulinemia: Secondary | ICD-10-CM

## 2023-07-15 DIAGNOSIS — R739 Hyperglycemia, unspecified: Secondary | ICD-10-CM

## 2023-07-15 DIAGNOSIS — E785 Hyperlipidemia, unspecified: Secondary | ICD-10-CM | POA: Diagnosis not present

## 2023-07-15 DIAGNOSIS — I10 Essential (primary) hypertension: Secondary | ICD-10-CM | POA: Diagnosis not present

## 2023-07-15 DIAGNOSIS — I5042 Chronic combined systolic (congestive) and diastolic (congestive) heart failure: Secondary | ICD-10-CM | POA: Diagnosis not present

## 2023-07-16 ENCOUNTER — Inpatient Hospital Stay: Payer: 59

## 2023-07-16 ENCOUNTER — Encounter: Payer: Self-pay | Admitting: Hematology & Oncology

## 2023-07-16 ENCOUNTER — Other Ambulatory Visit: Payer: Self-pay

## 2023-07-16 ENCOUNTER — Inpatient Hospital Stay (HOSPITAL_BASED_OUTPATIENT_CLINIC_OR_DEPARTMENT_OTHER): Payer: 59 | Admitting: Hematology & Oncology

## 2023-07-16 ENCOUNTER — Inpatient Hospital Stay: Payer: 59 | Attending: Hematology & Oncology

## 2023-07-16 VITALS — BP 153/88 | HR 85 | Temp 98.6°F | Resp 18 | Ht 66.0 in | Wt 192.1 lb

## 2023-07-16 VITALS — BP 135/74 | HR 94 | Temp 98.0°F | Resp 17

## 2023-07-16 DIAGNOSIS — Z5112 Encounter for antineoplastic immunotherapy: Secondary | ICD-10-CM | POA: Insufficient documentation

## 2023-07-16 DIAGNOSIS — C9002 Multiple myeloma in relapse: Secondary | ICD-10-CM

## 2023-07-16 DIAGNOSIS — C9000 Multiple myeloma not having achieved remission: Secondary | ICD-10-CM | POA: Diagnosis not present

## 2023-07-16 DIAGNOSIS — Z79899 Other long term (current) drug therapy: Secondary | ICD-10-CM | POA: Insufficient documentation

## 2023-07-16 LAB — CBC WITH DIFFERENTIAL (CANCER CENTER ONLY)
Abs Immature Granulocytes: 0.01 10*3/uL (ref 0.00–0.07)
Basophils Absolute: 0 10*3/uL (ref 0.0–0.1)
Basophils Relative: 2 %
Eosinophils Absolute: 0 10*3/uL (ref 0.0–0.5)
Eosinophils Relative: 2 %
HCT: 34.3 % — ABNORMAL LOW (ref 36.0–46.0)
Hemoglobin: 11 g/dL — ABNORMAL LOW (ref 12.0–15.0)
Immature Granulocytes: 0 %
Lymphocytes Relative: 23 %
Lymphs Abs: 0.6 10*3/uL — ABNORMAL LOW (ref 0.7–4.0)
MCH: 29.1 pg (ref 26.0–34.0)
MCHC: 32.1 g/dL (ref 30.0–36.0)
MCV: 90.7 fL (ref 80.0–100.0)
Monocytes Absolute: 0.3 10*3/uL (ref 0.1–1.0)
Monocytes Relative: 11 %
Neutro Abs: 1.5 10*3/uL — ABNORMAL LOW (ref 1.7–7.7)
Neutrophils Relative %: 62 %
Platelet Count: 113 10*3/uL — ABNORMAL LOW (ref 150–400)
RBC: 3.78 MIL/uL — ABNORMAL LOW (ref 3.87–5.11)
RDW: 14.1 % (ref 11.5–15.5)
WBC Count: 2.5 10*3/uL — ABNORMAL LOW (ref 4.0–10.5)
nRBC: 0 % (ref 0.0–0.2)

## 2023-07-16 LAB — COMPREHENSIVE METABOLIC PANEL
ALT: 18 U/L (ref 0–44)
AST: 24 U/L (ref 15–41)
Albumin: 3.5 g/dL (ref 3.5–5.0)
Alkaline Phosphatase: 42 U/L (ref 38–126)
Anion gap: 5 (ref 5–15)
BUN: 17 mg/dL (ref 8–23)
CO2: 27 mmol/L (ref 22–32)
Calcium: 9.1 mg/dL (ref 8.9–10.3)
Chloride: 107 mmol/L (ref 98–111)
Creatinine, Ser: 0.63 mg/dL (ref 0.44–1.00)
GFR, Estimated: >60 mL/min (ref >60)
Glucose, Bld: 117 mg/dL — ABNORMAL HIGH (ref 70–99)
Potassium: 3.7 mmol/L (ref 3.5–5.1)
Sodium: 139 mmol/L (ref 135–145)
Total Bilirubin: 0.5 mg/dL (ref 0.3–1.2)
Total Protein: 8.9 g/dL — ABNORMAL HIGH (ref 6.5–8.1)

## 2023-07-16 LAB — TYPE AND SCREEN
ABO/RH(D): A POS
Antibody Screen: NEGATIVE

## 2023-07-16 MED ORDER — DEXTROSE 5 % IV SOLN
20.0000 mg/m2 | Freq: Once | INTRAVENOUS | Status: AC
  Administered 2023-07-16: 40 mg via INTRAVENOUS
  Filled 2023-07-16: qty 15

## 2023-07-16 MED ORDER — DIPHENHYDRAMINE HCL 50 MG/ML IJ SOLN
50.0000 mg | Freq: Once | INTRAMUSCULAR | Status: AC
  Administered 2023-07-16: 50 mg via INTRAVENOUS

## 2023-07-16 MED ORDER — SODIUM CHLORIDE 0.9 % IV SOLN: Freq: Once | INTRAVENOUS | Status: AC

## 2023-07-16 MED ORDER — SODIUM CHLORIDE 0.9% FLUSH
10.0000 mL | INTRAVENOUS | Status: DC | PRN
  Administered 2023-07-16: 10 mL

## 2023-07-16 MED ORDER — SODIUM CHLORIDE 0.9 % IV SOLN
20.0000 mg | Freq: Once | INTRAVENOUS | Status: AC
  Administered 2023-07-16: 20 mg via INTRAVENOUS
  Filled 2023-07-16: qty 20

## 2023-07-16 MED ORDER — SODIUM CHLORIDE 0.9 % IV SOLN
10.0000 mg/kg | Freq: Once | INTRAVENOUS | Status: AC
  Administered 2023-07-16: 900 mg via INTRAVENOUS
  Filled 2023-07-16: qty 25

## 2023-07-16 MED ORDER — FAMOTIDINE IN NACL 20-0.9 MG/50ML-% IV SOLN
20.0000 mg | Freq: Once | INTRAVENOUS | Status: AC
  Administered 2023-07-16: 20 mg via INTRAVENOUS

## 2023-07-16 MED ORDER — MONTELUKAST SODIUM 10 MG PO TABS
10.0000 mg | ORAL_TABLET | Freq: Once | ORAL | Status: AC
  Administered 2023-07-16: 10 mg via ORAL
  Filled 2023-07-16: qty 1

## 2023-07-16 MED ORDER — ACETAMINOPHEN 325 MG PO TABS: 650.0000 mg | ORAL_TABLET | Freq: Once | ORAL | Status: DC

## 2023-07-16 MED ORDER — SODIUM CHLORIDE 0.9 % IV SOLN
300.0000 mg/m2 | Freq: Once | INTRAVENOUS | Status: AC
  Administered 2023-07-16: 600 mg via INTRAVENOUS
  Filled 2023-07-16: qty 30

## 2023-07-16 MED ORDER — HEPARIN SOD (PORK) LOCK FLUSH 100 UNIT/ML IV SOLN
500.0000 [IU] | Freq: Once | INTRAVENOUS | Status: AC | PRN
  Administered 2023-07-16: 500 [IU]

## 2023-07-16 NOTE — Progress Notes (Signed)
Hematology and Oncology Follow Up Visit  Tami [name] [id number] 1956/02/08 67 y.o. 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  She is not ready for treatment.  She feels okay.  She had a nice Labor Day weekend.  She is eating well.  Breve her sister is with her.  Her sister came down from [place].  It was really wonderful talking with her.  Tami [name] had monoclonal studies done back in August.  Her monoclonal spike was 2.7 g/dL.  Her IgG level was 4200 mg/dL.  Her Kappa light chain was 7.7 mg/dL.  She has had no problem with infections.  There is no fever.  She has had no diarrhea.  She goes to the bathroom twice a day..  She has had no rashes.  There is been no leg swelling.  She did have a bone marrow biopsy done on 06/29/2023.  The pathology report ([id number]) and kappa restricted plasma cell population that was about 10%.  Unfortunately, there was not a material to do FISH studies.  Overall, her performance status is ECOG 1.    Medications:  Allergies as of 07/16/2023       Reactions   Benazepril Anaphylaxis, Swelling, Hives   angioedema Throat and lip swelling   Ondansetron Hcl Hives   Redness and hives post IV admin on 07/05/17   Codeine Nausea And Vomiting   Morphine Hives   Redness and hives noted post IV admin on 07/05/17        Medication List        Accurate as of July 16, 2023  8:31 AM. If you have any questions, ask your nurse or doctor.          acyclovir 400 MG tablet Commonly  known as: ZOVIRAX Take 1 tablet (400 mg total) by mouth 2 (two) times daily.   albuterol 108 (90 Base) MCG/ACT inhaler Commonly known as: VENTOLIN HFA Inhale 2 puffs into the lungs every 6 (six) hours as needed for wheezing or shortness of breath.   albuterol (2.5 MG/3ML) 0.083% nebulizer solution Commonly known as: PROVENTIL Take 3 mLs (2.5 mg total) by nebulization every 6 (six) hours as needed for wheezing or shortness of breath.   Align 4 MG Caps Take 1 capsule (4 mg total) by mouth daily.   ALPRAZolam 0.25 MG tablet Commonly known as: XANAX Take 1-2 tablets (0.25-0.5 mg total) by mouth 3 (three) times daily as needed for anxiety.   amLODipine 5 MG tablet Commonly known as: NORVASC Take 1 tablet (5 mg total) by mouth daily.   aspirin EC 81 MG tablet Take 81 mg by mouth daily. Swallow whole.   budesonide 0.5 MG/2ML nebulizer solution Commonly known as: Pulmicort Take 2 mLs (0.5 mg total) by nebulization 2 (two) times daily as needed (cough, wheezing or shortness of breath).   celecoxib 100 MG capsule Commonly known as: CELEBREX Take 100 mg by mouth 2 (two) times daily.   CENTRUM SILVER WOMEN 50+ PO Take 1 tablet by mouth  daily at 6 (six) AM.   cetirizine 10 MG tablet Commonly known as: ZYRTEC Take 10 mg by mouth at bedtime.   cholestyramine light 4 g packet Commonly known as: PREVALITE Take 4 g by mouth 2 (two) times daily.   dicyclomine 10 MG capsule Commonly known as: BENTYL Take 1 capsule (10 mg total) by mouth 4 (four) times daily -  before meals and at bedtime. What changed: additional instructions   diphenoxylate-atropine 2.5-0.025 MG tablet Commonly known as: Lomotil Take 1 tablet by mouth 2 (two) times daily.   FeroSul 325 (65 Fe) MG tablet Generic drug: ferrous sulfate TAKE 1 TABLET BY MOUTH DAILY WITH BREAKFAST   FLORASTOR PO Take 500 mg by mouth daily at 6 (six) AM.   fluticasone 50 MCG/ACT nasal spray Commonly known as: FLONASE Place 2  sprays into both nostrils daily.   furosemide 20 MG tablet Commonly known as: LASIX Take 20 mg by mouth daily as needed (fluid retention (feet swelling)).   hydrOXYzine 10 MG tablet Commonly known as: ATARAX TAKE ONE (1) TABLET BY MOUTH 3 TIMES DAILY AS NEEDED FOR ITCHING   hyoscyamine 0.125 MG SL tablet Commonly known as: LEVSIN SL Place 1 tablet (0.125 mg total) under the tongue every 4 (four) hours as needed.   lidocaine-prilocaine cream Commonly known as: EMLA Apply 1 Application topically as needed.   loperamide 2 MG capsule Commonly known as: IMODIUM Take 4 mg by mouth in the morning and at bedtime.   losartan 50 MG tablet Commonly known as: COZAAR Take 50 mg by mouth in the morning and at bedtime.   Magnesium Glycinate 100 MG Caps Take 200 mg by mouth at bedtime.   meclizine 12.5 MG tablet Commonly known as: ANTIVERT Take 12.5 mg by mouth 2 (two) times daily as needed for dizziness or nausea.   metoprolol succinate 50 MG 24 hr tablet Commonly known as: TOPROL-XL Take 1 tablet (50 mg total) by mouth 2 (two) times daily. What changed: when to take this   montelukast 10 MG tablet Commonly known as: Singulair Take 1 tablet (10 mg total) by mouth at bedtime. Please start taking this on September 4.   nortriptyline 10 MG capsule Commonly known as: Pamelor Take 1 capsule (10 mg total) by mouth at bedtime.   ondansetron 8 MG tablet Commonly known as: Zofran Take 1 tablet (8 mg total) by mouth every 8 (eight) hours as needed for nausea or vomiting.   oxyCODONE-acetaminophen 5-325 MG tablet Commonly known as: PERCOCET/ROXICET Take 1 tablet by mouth every 6 (six) hours as needed for severe pain.   potassium chloride SA 20 MEQ tablet Commonly known as: KLOR-CON M Take 1 tablet (20 mEq total) by mouth daily.   prochlorperazine 10 MG tablet Commonly known as: COMPAZINE Take 0.5 tablets (5 mg total) by mouth 2 (two) times daily as needed for nausea or vomiting.    Quintabs Tabs Take 1 tablet by mouth daily.   scopolamine 1 MG/3DAYS Commonly known as: TRANSDERM-SCOP Place 1 patch (1.5 mg total) onto the skin every 3 (three) days.   scopolamine 1 MG/3DAYS Commonly known as: TRANSDERM-SCOP Place 1 patch (1.5 mg total) onto the skin every 3 (three) days.   sertraline 50 MG tablet Commonly known as: ZOLOFT Take 1 tablet (50 mg total) by mouth daily.   spironolactone 25 MG tablet Commonly known as: ALDACTONE Take 12.5 mg by mouth daily.   Vitamin D3 50 MCG (2000 UT) Tabs Take 2,000 Units by mouth in the  morning.        Allergies:  Allergies  Allergen Reactions   Benazepril Anaphylaxis, Swelling and Hives    angioedema Throat and lip swelling   Ondansetron Hcl Hives    Redness and hives post IV admin on 07/05/17   Codeine Nausea And Vomiting   Morphine Hives    Redness and hives noted post IV admin on 07/05/17    Past Medical History, Surgical history, Social history, and Family History were reviewed and updated.  Review of Systems: Review of Systems  Constitutional: Negative.   HENT: Negative.    Eyes: Negative.   Respiratory: Negative.    Cardiovascular: Negative.   Gastrointestinal: Negative.   Genitourinary: Negative.   Musculoskeletal: Negative.   Skin: Negative.   Neurological: Negative.   Endo/Heme/Allergies: Negative.   Psychiatric/Behavioral: Negative.        Physical Exam: Temperature is 98.6.  Pulse 85.  Blood pressure 153/88.  Weight is 192 pounds.    Wt Readings from Last 3 Encounters:  07/16/23 192 lb 1.6 oz (87.1 kg)  07/15/23 192 lb (87.1 kg)  07/08/23 190 lb (86.2 kg)    Physical Exam Vitals reviewed.  HENT:     Head: Normocephalic and atraumatic.  Eyes:     Pupils: Pupils are equal, round, and reactive to light.     Comments: Mild pallor of conjunctiva  Cardiovascular:     Rate and Rhythm: Normal rate and regular rhythm.     Heart sounds: Normal heart sounds.  Pulmonary:     Effort:  Pulmonary effort is normal.     Breath sounds: Normal breath sounds.  Abdominal:     General: Bowel sounds are normal.     Palpations: Abdomen is soft.  Musculoskeletal:        General: No tenderness or deformity. Normal range of motion.     Cervical back: Normal range of motion.  Lymphadenopathy:     Cervical: No cervical adenopathy.  Skin:    General: Skin is warm and dry.     Findings: No erythema or rash.  Neurological:     Mental Status: She is alert and oriented to person, place, and time.  Psychiatric:        Behavior: Behavior normal.        Thought Content: Thought content normal.        Judgment: Judgment normal.      Lab Results  Component Value Date   WBC 2.4 (L) 07/01/2023   HGB 12.0 07/01/2023   HCT 38.6 07/01/2023   MCV 91.7 07/01/2023   PLT 101 (L) 07/01/2023   Lab Results  Component Value Date   FERRITIN 288 06/24/2023   IRON 86 06/24/2023   TIBC 335 06/24/2023   UIBC 249 06/24/2023   IRONPCTSAT 26 06/24/2023   Lab Results  Component Value Date   RETICCTPCT 1.2 11/27/2022   RBC 4.21 07/01/2023   RETICCTABS 32,640 07/16/2016   Lab Results  Component Value Date   KPAFRELGTCHN 64.9 (H) 07/01/2023   LAMBDASER 8.2 07/01/2023   KAPLAMBRATIO 7.22 07/05/2023   Lab Results  Component Value Date   IGGSERUM 4,402 (H) 07/01/2023   IGA 198 07/01/2023   IGMSERUM 133 07/01/2023   Lab Results  Component Value Date   TOTALPROTELP 9.2 (H) 07/01/2023   ALBUMINELP 3.4 07/01/2023   A1GS 0.3 07/01/2023   A2GS 1.0 07/01/2023   BETS 1.2 07/01/2023   BETA2SER 0.5 07/16/2016   GAMS 3.3 (H) 07/01/2023   MSPIKE 2.7 (H) 07/01/2023  SPEI Comment 10/27/2022     Chemistry      Component Value Date/Time   NA 139 07/01/2023 1008   NA 143 11/08/2017 1127   NA 140 12/31/2016 1000   K 4.0 07/01/2023 1008   K 4.0 11/08/2017 1127   K 3.6 12/31/2016 1000   CL 105 07/01/2023 1008   CL 107 11/08/2017 1127   CO2 28 07/01/2023 1008   CO2 25 11/08/2017 1127    CO2 24 12/31/2016 1000   BUN 16 07/01/2023 1008   BUN 19 11/08/2017 1127   BUN 22.3 12/31/2016 1000   CREATININE 0.63 07/01/2023 1008   CREATININE 0.9 11/08/2017 1127   CREATININE 0.8 12/31/2016 1000      Component Value Date/Time   CALCIUM 9.3 07/01/2023 1008   CALCIUM 9.9 11/08/2017 1127   CALCIUM 9.5 12/31/2016 1000   ALKPHOS 42 07/01/2023 1008   ALKPHOS 44 11/08/2017 1127   ALKPHOS 51 12/31/2016 1000   AST 24 07/01/2023 1008   AST 28 12/31/2016 1000   ALT 14 07/01/2023 1008   ALT 36 11/08/2017 1127   ALT 16 12/31/2016 1000   BILITOT 0.4 07/01/2023 1008   BILITOT 0.46 12/31/2016 1000       Impression and Plan: Tami [name] is a very pleasant 67 yo African American female with a previous IgG kappa MGUS with progression to myeloma.  We will go ahead and start her chemotherapy now.  We will go ahead and adjust her of chemotherapy for the first cycle.  She will be going on a cruise on September 22.  I want to ensure that she is able to enjoy her cruise.  I think that we should be able to get a good response with treatment.  Hopefully, she will have no problems with treatment.  I will plan to get back to see me when she has the start of her second cycle in October.    Josph Macho, MD 9/6/20248:31 AM

## 2023-07-16 NOTE — Patient Instructions (Addendum)
[facility]   Discharge Instructions: Thank you for choosing [facility] to provide your oncology and hematology care.   If you have a lab appointment with the Cancer Center, please go directly to the Cancer Center and check in at the registration area.  Wear comfortable clothing and clothing appropriate for easy access to any Portacath or PICC line.   We strive to give you quality time with your provider. You may need to reschedule your appointment if you arrive late (15 or more minutes).  Arriving late affects you and other patients whose appointments are after yours.  Also, if you miss three or more appointments without notifying the office, you may be dismissed from the clinic at the provider's discretion.      For prescription refill requests, have your pharmacy contact our office and allow 72 hours for refills to be completed.    Today you received the following chemotherapy and/or immunotherapy agents cytoxan/sarclisa/kyprolis      To help prevent nausea and vomiting after your treatment, we encourage you to take your nausea medication as directed.  BELOW ARE SYMPTOMS THAT SHOULD BE REPORTED IMMEDIATELY: *FEVER GREATER THAN 100.4 F (38 C) OR HIGHER *CHILLS OR SWEATING *NAUSEA AND VOMITING THAT IS NOT CONTROLLED WITH YOUR NAUSEA MEDICATION *UNUSUAL SHORTNESS OF BREATH *UNUSUAL BRUISING OR BLEEDING *URINARY PROBLEMS (pain or burning when urinating, or frequent urination) *BOWEL PROBLEMS (unusual diarrhea, constipation, pain near the anus) TENDERNESS IN MOUTH AND THROAT WITH OR WITHOUT PRESENCE OF ULCERS (sore throat, sores in mouth, or a toothache) UNUSUAL RASH, SWELLING OR PAIN  UNUSUAL VAGINAL DISCHARGE OR ITCHING   Items with * indicate a potential emergency and should be followed up as soon as possible or go to the Emergency Department if any problems should occur.  Please show the CHEMOTHERAPY ALERT CARD or IMMUNOTHERAPY  ALERT CARD at check-in to the Emergency Department and triage nurse. Should you have questions after your visit or need to cancel or reschedule your appointment, please contact [facility]  [phone] and follow the prompts.  Office hours are 8:00 a.m. to 4:30 p.m. Monday - Friday. Please note that voicemails left after 4:00 p.m. may not be returned until the following business day.  We are closed weekends and major holidays. You have access to a nurse at all times for urgent questions. Please call the main number to the clinic [phone] and follow the prompts.  For any non-urgent questions, you may also contact your provider using MyChart. We now offer e-Visits for anyone 53 and older to request care online for non-urgent symptoms. For details visit [email or web address].   Also download the MyChart app! Go to the app store, search "MyChart", open the app, select [facility], and log in with your MyChart username and password.

## 2023-07-16 NOTE — Patient Instructions (Signed)

## 2023-07-17 ENCOUNTER — Other Ambulatory Visit: Payer: Self-pay

## 2023-07-19 ENCOUNTER — Encounter (HOSPITAL_COMMUNITY)
Admission: RE | Admit: 2023-07-19 | Discharge: 2023-07-19 | Disposition: A | Discharge status: Home / Self Care | Payer: 59 | Source: Ambulatory Visit | Attending: Hematology & Oncology | Admitting: Hematology & Oncology

## 2023-07-19 ENCOUNTER — Other Ambulatory Visit: Payer: Self-pay

## 2023-07-19 ENCOUNTER — Emergency Department (HOSPITAL_BASED_OUTPATIENT_CLINIC_OR_DEPARTMENT_OTHER)
Admission: EM | Admit: 2023-07-19 | Discharge: 2023-07-19 | Disposition: A | Discharge status: Home / Self Care | Payer: 59

## 2023-07-19 ENCOUNTER — Encounter (HOSPITAL_BASED_OUTPATIENT_CLINIC_OR_DEPARTMENT_OTHER): Payer: Self-pay | Admitting: Urology

## 2023-07-19 DIAGNOSIS — C9000 Multiple myeloma not having achieved remission: Secondary | ICD-10-CM | POA: Insufficient documentation

## 2023-07-19 DIAGNOSIS — Z79899 Other long term (current) drug therapy: Secondary | ICD-10-CM | POA: Diagnosis not present

## 2023-07-19 DIAGNOSIS — C9002 Multiple myeloma in relapse: Secondary | ICD-10-CM | POA: Diagnosis not present

## 2023-07-19 DIAGNOSIS — E778 Other disorders of glycoprotein metabolism: Secondary | ICD-10-CM | POA: Insufficient documentation

## 2023-07-19 DIAGNOSIS — R000 Tachycardia, unspecified: Secondary | ICD-10-CM | POA: Insufficient documentation

## 2023-07-19 DIAGNOSIS — R197 Diarrhea, unspecified: Secondary | ICD-10-CM | POA: Insufficient documentation

## 2023-07-19 DIAGNOSIS — I10 Essential (primary) hypertension: Secondary | ICD-10-CM | POA: Insufficient documentation

## 2023-07-19 DIAGNOSIS — E860 Dehydration: Secondary | ICD-10-CM | POA: Diagnosis not present

## 2023-07-19 DIAGNOSIS — Z7982 Long term (current) use of aspirin: Secondary | ICD-10-CM | POA: Diagnosis not present

## 2023-07-19 DIAGNOSIS — Z8579 Personal history of other malignant neoplasms of lymphoid, hematopoietic and related tissues: Secondary | ICD-10-CM | POA: Insufficient documentation

## 2023-07-19 DIAGNOSIS — D72819 Decreased white blood cell count, unspecified: Secondary | ICD-10-CM | POA: Diagnosis not present

## 2023-07-19 LAB — CBC WITH DIFFERENTIAL/PLATELET
Abs Immature Granulocytes: 0 10*3/uL (ref 0.00–0.07)
Basophils Absolute: 0 10*3/uL (ref 0.0–0.1)
Basophils Relative: 0 %
Eosinophils Absolute: 0.1 10*3/uL (ref 0.0–0.5)
Eosinophils Relative: 2 %
HCT: 37.9 % (ref 36.0–46.0)
Hemoglobin: 12.1 g/dL (ref 12.0–15.0)
Immature Granulocytes: 0 %
Lymphocytes Relative: 42 %
Lymphs Abs: 1.1 10*3/uL (ref 0.7–4.0)
MCH: 29 pg (ref 26.0–34.0)
MCHC: 31.9 g/dL (ref 30.0–36.0)
MCV: 90.9 fL (ref 80.0–100.0)
Monocytes Absolute: 0.2 10*3/uL (ref 0.1–1.0)
Monocytes Relative: 9 %
Neutro Abs: 1.3 10*3/uL — ABNORMAL LOW (ref 1.7–7.7)
Neutrophils Relative %: 47 %
Platelets: 109 10*3/uL — ABNORMAL LOW (ref 150–400)
RBC: 4.17 MIL/uL (ref 3.87–5.11)
RDW: 14.2 % (ref 11.5–15.5)
WBC: 2.7 10*3/uL — ABNORMAL LOW (ref 4.0–10.5)
nRBC: 0 % (ref 0.0–0.2)

## 2023-07-19 LAB — URINALYSIS, MICROSCOPIC (REFLEX)

## 2023-07-19 LAB — COMPREHENSIVE METABOLIC PANEL
ALT: 26 U/L (ref 0–44)
AST: 34 U/L (ref 15–41)
Albumin: 3.2 g/dL — ABNORMAL LOW (ref 3.5–5.0)
Alkaline Phosphatase: 46 U/L (ref 38–126)
Anion gap: 7 (ref 5–15)
BUN: 17 mg/dL (ref 8–23)
CO2: 26 mmol/L (ref 22–32)
Calcium: 8.8 mg/dL — ABNORMAL LOW (ref 8.9–10.3)
Chloride: 103 mmol/L (ref 98–111)
Creatinine, Ser: 0.6 mg/dL (ref 0.44–1.00)
GFR, Estimated: >60 mL/min (ref >60)
Glucose, Bld: 100 mg/dL — ABNORMAL HIGH (ref 70–99)
Potassium: 4.1 mmol/L (ref 3.5–5.1)
Sodium: 136 mmol/L (ref 135–145)
Total Bilirubin: 0.3 mg/dL (ref 0.3–1.2)
Total Protein: 9.1 g/dL — ABNORMAL HIGH (ref 6.5–8.1)

## 2023-07-19 LAB — URINALYSIS, ROUTINE W REFLEX MICROSCOPIC
Bilirubin Urine: NEGATIVE
Glucose, UA: NEGATIVE mg/dL
Ketones, ur: NEGATIVE mg/dL
Nitrite: POSITIVE — AB
Protein, ur: NEGATIVE mg/dL
Specific Gravity, Urine: >=1.03 (ref 1.005–1.030)
pH: 5.5 (ref 5.0–8.0)

## 2023-07-19 LAB — GLUCOSE, CAPILLARY: Glucose-Capillary: 95 mg/dL (ref 70–99)

## 2023-07-19 LAB — MAGNESIUM: Magnesium: 1.9 mg/dL (ref 1.7–2.4)

## 2023-07-19 MED ORDER — FLUDEOXYGLUCOSE F - 18 (FDG) INJECTION
9.5700 | Freq: Once | INTRAVENOUS | Status: AC
  Administered 2023-07-19: 9.57 via INTRAVENOUS

## 2023-07-19 MED ORDER — LACTATED RINGERS IV BOLUS
1000.0000 mL | Freq: Once | INTRAVENOUS | Status: AC
  Administered 2023-07-19: 1000 mL via INTRAVENOUS

## 2023-07-19 NOTE — ED Triage Notes (Signed)
Pt states feels like she needs fluids  Has had diarrhea since CS treatment   Had CA treatment Friday  Had CT scan at [facility] this am at 0830

## 2023-07-19 NOTE — ED Provider Notes (Signed)
[facility] Provider Note   CSN: [id number] Arrival date & time: 07/19/23  1136     History  Chief Complaint  Patient presents with   Diarrhea    Tami [name] is a 67 y.o. 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  She notes that she has chronic diarrhea and has had so for the last 2 years.  Notes that recently she has had 5-6 episodes per day over the last week or so.  No associated pain.  No bloody stools.  No fever or other complaints.  States that she resumed her chemotherapy treatments 3 days ago and believes that diarrhea has worsened secondary to this.  States that typically when she has diarrhea this frequent she comes to the ED for fluids. Had an outpatient PET scan this morning at [facility].  This has not yet resulted.  Has scheduled follow-up with her oncologist this upcoming Friday.  No other acute complaints today.  No known sick contacts.       Home Medications Prior to Admission medications   Medication Sig Start Date End Date Taking? Authorizing Provider  acyclovir (ZOVIRAX) 400 MG tablet Take 1 tablet (400 mg total) by mouth 2 (two) times daily. 04/22/23   Josph Macho, MD  albuterol (PROVENTIL) (2.5 MG/3ML) 0.083% nebulizer solution Take 3 mLs (2.5 mg total) by nebulization every 6 (six) hours as needed for wheezing or shortness of breath. 12/15/22   [name], MD  albuterol (VENTOLIN HFA) 108 (90 Base) MCG/ACT inhaler Inhale 2 puffs into the lungs every 6 (six) hours as needed for wheezing or shortness of breath. 12/07/22   [name], MD  ALPRAZolam Prudy Feeler) 0.25 MG tablet Take 1-2 tablets (0.25-0.5 mg total) by mouth 3 (three) times daily as needed for anxiety. 06/14/23   [name], MD  amLODipine (NORVASC) 5 MG tablet Take 1 tablet (5 mg  total) by mouth daily. 03/16/23   [name], MD  aspirin EC 81 MG tablet Take 81 mg by mouth daily. Swallow whole.    [provider]  budesonide (PULMICORT) 0.5 MG/2ML nebulizer solution Take 2 mLs (0.5 mg total) by nebulization 2 (two) times daily as needed (cough, wheezing or shortness of breath). 12/31/22   Martina Sinner, MD  celecoxib (CELEBREX) 100 MG capsule Take 100 mg by mouth 2 (two) times daily. 06/07/23   [provider]  cetirizine (ZYRTEC) 10 MG tablet Take 10 mg by mouth at bedtime.    [provider]  Cholecalciferol (VITAMIN D3) 50 MCG (2000 UT) TABS Take 2,000 Units by mouth in the morning.    [provider]  cholestyramine light (PREVALITE) 4 g packet Take 4 g by mouth 2 (two) times daily.    [provider]  dicyclomine (BENTYL) 10 MG capsule Take 1 capsule (10 mg total) by mouth 4 (four) times daily -  before meals and at bedtime. Patient taking differently: Take 10 mg by mouth 4 (four) times daily -  before meals and at bedtime. cramping 01/25/23 04/25/23  Judyann Munson, MD  diphenoxylate-atropine (LOMOTIL) 2.5-0.025 MG tablet Take 1 tablet by mouth 2 (two) times daily. 05/19/23   Lynann Bologna, MD  ferrous sulfate (FEROSUL) 325 (65 FE) MG tablet TAKE 1 TABLET BY MOUTH DAILY WITH BREAKFAST 06/28/23  [name], MD  fluticasone (FLONASE) 50 MCG/ACT nasal spray Place 2 sprays into both nostrils daily. 01/07/20   Joy, Shawn C, PA-C  furosemide (LASIX) 20 MG tablet Take 20 mg by mouth daily as needed (fluid retention (feet swelling)). 07/03/20   [provider]  hydrOXYzine (ATARAX) 10 MG tablet TAKE ONE (1) TABLET BY MOUTH 3 TIMES DAILY AS NEEDED FOR ITCHING 03/03/23   [name], MD  hyoscyamine (LEVSIN SL) 0.125 MG SL tablet Place 1 tablet (0.125 mg total) under the tongue every 4 (four) hours as needed. 04/27/23   [name], MD  lidocaine-prilocaine (EMLA) cream Apply 1 Application topically as needed.  07/01/23   Josph Macho, MD  loperamide (IMODIUM) 2 MG capsule Take 4 mg by mouth in the morning and at bedtime.    [provider]  losartan (COZAAR) 50 MG tablet Take 50 mg by mouth in the morning and at bedtime. 04/14/21   [provider]  Magnesium Glycinate 100 MG CAPS Take 200 mg by mouth at bedtime. 09/21/22   [provider]  meclizine (ANTIVERT) 12.5 MG tablet Take 12.5 mg by mouth 2 (two) times daily as needed for dizziness or nausea. 01/28/22   [provider]  metoprolol succinate (TOPROL-XL) 50 MG 24 hr tablet Take 1 tablet (50 mg total) by mouth 2 (two) times daily. Patient taking differently: Take 50 mg by mouth daily. 02/23/23   [name], MD  montelukast (SINGULAIR) 10 MG tablet Take 1 tablet (10 mg total) by mouth at bedtime. Please start taking this on September 4. 07/01/23   Josph Macho, MD  Multiple Vitamin Brandt Loosen) TABS Take 1 tablet by mouth daily. 11/06/21   [provider]  Multiple Vitamins-Minerals (CENTRUM SILVER WOMEN 50+ PO) Take 1 tablet by mouth daily at 6 (six) AM.    [provider]  nortriptyline (PAMELOR) 10 MG capsule Take 1 capsule (10 mg total) by mouth at bedtime. 06/14/23   [name], MD  ondansetron (ZOFRAN) 8 MG tablet Take 1 tablet (8 mg total) by mouth every 8 (eight) hours as needed for nausea or vomiting. 07/14/23   Josph Macho, MD  oxyCODONE-acetaminophen (PERCOCET/ROXICET) 5-325 MG tablet Take 1 tablet by mouth every 6 (six) hours as needed for severe pain. 11/07/21   [provider]  potassium chloride SA (KLOR-CON M) 20 MEQ tablet Take 1 tablet (20 mEq total) by mouth daily. 01/15/23   [name], MD  Probiotic Product (ALIGN) 4 MG CAPS Take 1 capsule (4 mg total) by mouth daily. 03/16/23   [name], MD  prochlorperazine (COMPAZINE) 10 MG tablet Take 0.5 tablets (5 mg total) by mouth 2 (two) times daily as needed for nausea or vomiting. 03/16/23   [name],  MD  Saccharomyces boulardii (FLORASTOR PO) Take 500 mg by mouth daily at 6 (six) AM.    [provider]  scopolamine (TRANSDERM-SCOP) 1 MG/3DAYS Place 1 patch (1.5 mg total) onto the skin every 3 (three) days. Patient not taking: Reported on 07/16/2023 06/14/23   [name], MD  scopolamine (TRANSDERM-SCOP) 1 MG/3DAYS Place 1 patch (1.5 mg total) onto the skin every 3 (three) days. Patient not taking: Reported on 07/16/2023 07/01/23   Josph Macho, MD  sertraline (ZOLOFT) 50 MG tablet Take 1 tablet (50 mg total) by mouth daily. 06/14/23   [name], MD  spironolactone (ALDACTONE) 25 MG tablet Take 12.5 mg by mouth daily.  [provider]      Allergies    Benazepril, Ondansetron hcl, Codeine, and Morphine    Review of Systems   Review of Systems  All other systems reviewed and are negative.   Physical Exam Updated Vital Signs BP (!) 171/112   Pulse 86   Temp 97.9 F (36.6 C) (Oral)   Resp 15   Ht 5\' 6"  (1.676 m)   Wt 87 kg   SpO2 97%   BMI 30.96 kg/m  Physical Exam Vitals and nursing note reviewed.  Constitutional:      General: She is not in acute distress.    Appearance: Normal appearance. She is not ill-appearing or toxic-appearing.  HENT:     Head: Normocephalic and atraumatic.     Mouth/Throat:     Mouth: Mucous membranes are dry.  Eyes:     Conjunctiva/sclera: Conjunctivae normal.  Cardiovascular:     Rate and Rhythm: Regular rhythm. Tachycardia present.     Heart sounds: No murmur heard. Pulmonary:     Effort: Pulmonary effort is normal. No respiratory distress.     Breath sounds: Normal breath sounds. No stridor. No wheezing, rhonchi or rales.  Abdominal:     General: Abdomen is flat. There is no distension.     Palpations: Abdomen is soft.     Tenderness: There is no abdominal tenderness. There is no right CVA tenderness, left CVA tenderness, guarding or rebound.  Musculoskeletal:        General: Normal range of motion.      Cervical back: Neck supple.     Right lower leg: No edema.     Left lower leg: No edema.  Skin:    General: Skin is warm and dry.     Capillary Refill: Capillary refill takes less than 2 seconds.     Coloration: Skin is not jaundiced or pale.  Neurological:     General: No focal deficit present.     Mental Status: She is alert and oriented to person, place, and time.  Psychiatric:        Mood and Affect: Mood normal.        Behavior: Behavior normal.     ED Results / Procedures / Treatments   Labs (all labs ordered are listed, but only abnormal results are displayed) Labs Reviewed  CBC WITH DIFFERENTIAL/PLATELET - Abnormal; Notable for the following components:      Result Value   WBC 2.7 (*)    Platelets 109 (*)    Neutro Abs 1.3 (*)    All other components within normal limits  COMPREHENSIVE METABOLIC PANEL - Abnormal; Notable for the following components:   Glucose, Bld 100 (*)    Calcium 8.8 (*)    Total Protein 9.1 (*)    Albumin 3.2 (*)    All other components within normal limits  URINALYSIS, ROUTINE W REFLEX MICROSCOPIC - Abnormal; Notable for the following components:   APPearance HAZY (*)    Hgb urine dipstick TRACE (*)    Nitrite POSITIVE (*)    Leukocytes,Ua TRACE (*)    All other components within normal limits  URINALYSIS, MICROSCOPIC (REFLEX) - Abnormal; Notable for the following components:   Bacteria, UA FEW (*)    All other components within normal limits  MAGNESIUM    EKG None  Radiology No results found.  Procedures Procedures    Medications Ordered in ED Medications  lactated ringers bolus 1,000 mL (1,000 mLs Intravenous New Bag/Given 07/19/23 1420)  ED Course/ Medical Decision Making/ A&P Clinical Course as of 07/19/23 1615  Mon Jul 19, 2023  1549 Patient feeling significantly better.  Her blood pressure is elevated.  She notes that she did not take her regular medication this morning since she was scheduled for her PET scan.  She  will resume her medication regimen this evening.  Discussed with patient that blood pressure is significantly elevated and she should closely monitor this at home and follow-up with her PCP if it remains uncontrolled.  Patient expressed understanding of plan. [MG]    Clinical Course User Index [MG] Tonette Lederer, PA-C                                 Medical Decision Making Amount and/or Complexity of Data Reviewed Labs: ordered. Decision-making details documented in ED Course.   Medical Decision Making:   TAQUIA PIZZOLA is a 66 y.o. female who presented to the ED today with diarrhea detailed above.    Patient's presentation is complicated by their history of multiple myeloma, HF, HTN, HLD.  Complete initial physical exam performed, notably the patient was nontoxic-appearing.  No respiratory distress, lungs clear to auscultation.  Abdomen soft and nontender.  No lower extremity edema.   Mild tachycardia with regular rhythm.  Moderately dry mucous membranes. Reviewed and confirmed nursing documentation for past medical history, family history, social history.    Initial Assessment:   With the patient's presentation, differential diagnosis includes but is not limited to dehydration, electrolyte disturbance, AKI, infectious colitis, acute abdomen, viral illness, medication reaction.   This is most consistent with an acute complicated illness  Initial Plan:  Screening labs including CBC and Metabolic panel to evaluate for infectious or metabolic etiology of disease.  Urinalysis with reflex culture ordered to evaluate for UTI or relevant urologic/nephrologic pathology.  Fluid hydration Objective evaluation as below reviewed   Initial Study Results:   Laboratory  All laboratory results reviewed without evidence of clinically relevant pathology.   Exceptions include: calcium 8.8, albumin 3.2, WBC 2.7, PLT 109, UA appears contaminated   Radiology:  All images reviewed independently.  Agree with radiology report at this time.   DG Lumbar Spine 2-3 Views  Result Date: 07/09/2023 CLINICAL DATA:  Low back pain. History of multiple myeloma. Assess for lytic lesions. EXAM: LUMBAR SPINE - 2-3 VIEW COMPARISON:  December 31, 2022 FINDINGS: There is no evidence of lumbar spine fracture. No focal lytic lesion is identified. Grade 1 anterolisthesis of L4 on L5 with mild narrow intervertebral space at L4 and L5 identified. Mild facet joint sclerosis noted in L4-5, L5-S1. IMPRESSION: No focal lytic lesion is identified. Mild degenerative joint changes of lower lumbar spine. Electronically Signed   By: Sherian Rein M.D.   On: 07/09/2023 10:07   IR IMAGING GUIDED PORT INSERTION  Result Date: 06/29/2023 CLINICAL DATA:  History of multiple myeloma and need for porta cath for chemotherapy. EXAM: IMPLANTED PORT A CATH PLACEMENT WITH ULTRASOUND AND FLUOROSCOPIC GUIDANCE ANESTHESIA/SEDATION: Moderate (conscious) sedation was employed during this procedure. A total of Versed 1.0 mg and Fentanyl 50 mcg was administered intravenously by radiology nursing. Moderate Sedation Time: 39 minutes. The patient's level of consciousness and vital signs were monitored continuously by radiology nursing throughout the procedure under my direct supervision. MEDICATIONS: 50 mg IV Benadryl, 2 g IV Ancef. FLUOROSCOPY: 30 seconds.  1.0 mGy. PROCEDURE: The procedure, risks, benefits, and alternatives were explained  to the patient. Questions regarding the procedure were encouraged and answered. The patient understands and consents to the procedure. A time-out was performed prior to initiating the procedure. Ultrasound was utilized to confirm patency of the right internal jugular vein. Ultrasound image was saved and recorded. The right neck and chest were prepped with chlorhexidine in a sterile fashion, and a sterile drape was applied covering the operative field. Maximum barrier sterile technique with sterile gowns and gloves were  used for the procedure. Local anesthesia was provided with 1% lidocaine. After creating a small venotomy incision, a 21 gauge needle was advanced into the right internal jugular vein under direct, real-time ultrasound guidance. Ultrasound image documentation was performed. After securing guidewire access, an 8 Fr dilator was placed. A J-wire was kinked to measure appropriate catheter length. A subcutaneous port pocket was then created along the upper chest wall utilizing sharp and blunt dissection. Portable cautery was utilized. The pocket was irrigated with sterile saline. A single lumen power injectable port was chosen for placement. The 8 Fr catheter was tunneled from the port pocket site to the venotomy incision. The port was placed in the pocket. External catheter was trimmed to appropriate length based on guidewire measurement. At the venotomy, an 8 Fr peel-away sheath was placed over a guidewire. The catheter was then placed through the sheath and the sheath removed. Final catheter positioning was confirmed and documented with a fluoroscopic spot image. The port was accessed with a needle and aspirated and flushed with heparinized saline. The access needle was removed. The venotomy and port pocket incisions were closed with subcutaneous 3-0 Monocryl and subcuticular 4-0 Vicryl. Dermabond was applied to both incisions. COMPLICATIONS: COMPLICATIONS None FINDINGS: After catheter placement, the tip lies at the cavo-atrial junction. The catheter aspirates normally and is ready for immediate use. IMPRESSION: Placement of single lumen port a cath via right internal jugular vein. The catheter tip lies at the cavo-atrial junction. A power injectable port a cath was placed and is ready for immediate use. Electronically Signed   By: Irish Lack M.D.   On: 06/29/2023 14:03   CT BONE MARROW BIOPSY & ASPIRATION  Result Date: 06/29/2023 CLINICAL DATA:  History of multiple myeloma and need for bone marrow biopsy to  assess status of disease. EXAM: CT GUIDED BONE MARROW ASPIRATION AND BIOPSY ANESTHESIA/SEDATION: Moderate (conscious) sedation was employed during this procedure. A total of Versed 2.0 mg and Fentanyl 100 mcg was administered intravenously. Moderate Sedation Time: 30 minutes. The patient's level of consciousness and vital signs were monitored continuously by radiology nursing throughout the procedure under my direct supervision. PROCEDURE: The procedure risks, benefits, and alternatives were explained to the patient. Questions regarding the procedure were encouraged and answered. The patient understands and consents to the procedure. A time out was performed prior to initiating the procedure. The right gluteal region was prepped with chlorhexidine. Sterile gown and sterile gloves were used for the procedure. Local anesthesia was provided with 1% Lidocaine. Under CT guidance, an 11 gauge On Control bone cutting needle was advanced from a posterior approach into the right iliac bone. Needle positioning was confirmed with CT. Initial non heparinized and heparinized aspirate samples were obtained of bone marrow. Core biopsy was performed via the On Control drill needle. COMPLICATIONS: None FINDINGS: Inspection of initial aspirate did reveal visible particles. Intact core biopsy sample was obtained. IMPRESSION: CT guided bone marrow biopsy of right posterior iliac bone with both aspirate and core samples obtained. Electronically Signed   By:  Irish Lack M.D.   On: 06/29/2023 14:02      Final Assessment and Plan:   67 year old female presents to the ED complaining of chronic diarrhea.  Notes that this has been ongoing for 2 years.  Unchanged from baseline apart from increased frequency 5-6 episodes per day.  No hematochezia or melena.  Notes increase in frequency since resuming chemotherapy last week.  No associated pain.  Abdomen soft and nontender.  Afebrile, nontoxic-appearing.  Patient is mildly tachycardic  and appears dehydrated.  Slow fluid bolus given patient's history of heart failure though she does appear volume down.  Labs at baseline.  Patient reports feeling well on multiple reassessments.  Abdomen soft and nontender on serial exams.  Low suspicion for acute abdomen given chronic diarrhea.  Following fluid hydration, patient would like to go home.  No significant electrolyte disturbance, no AKI.  Will follow-up closely with her outpatient team.  Discussed good blood pressure control.  She has not taken antihypertensives before presentation today.  Will monitor closely at home.  Strict ED return precautions given, all questions answered, and stable for discharge.   Clinical Impression:  1. Diarrhea, unspecified type   2. Dehydration   3. Elevated blood pressure reading in office with diagnosis of hypertension      Discharge           Final Clinical Impression(s) / ED Diagnoses Final diagnoses:  Diarrhea, unspecified type  Dehydration  Elevated blood pressure reading in office with diagnosis of hypertension    Rx / DC Orders ED Discharge Orders     None         Tonette Lederer, PA-C 07/19/23 1617    Durwin Glaze, MD 07/20/23 [phone]

## 2023-07-19 NOTE — Discharge Instructions (Addendum)
Thank you for letting us take care of you today.  There were no significant changes to your labs.  We gave you fluids to help with hydration.  Follow-up with your outpatient team as scheduled this week.  Your blood pressure was elevated.  Continue to take your medication as prescribed by your PCP.  I recommend monitoring this once to twice daily and keeping a log to see if it remains significantly elevated.  If this is the case, you may need medication adjustments by your PCP.  If you develop worsening symptoms such as uncontrollable vomiting or diarrhea, fevers, abdominal pain, or other worsening condition, return to the nearest ED for reevaluation.

## 2023-07-19 NOTE — Progress Notes (Signed)
Subjective:    [name], female    DOB: April 18, 1956, 67 y.o.   MRN: [id number]  Chief Complaint  Patient presents with  . Follow-up    Follow up    HPI Discussed the use of AI scribe software for clinical note transcription with the patient, who gave verbal consent to proceed.  History of Present Illness   The patient, with a history of cancer, presents with concerns about starting a new chemotherapy treatment. She expresses anxiety about potential side effects, although specific side effects were not discussed in detail. The patient also reports having fallen twice in the bathtub, indicating potential mobility and safety issues at home. The patient is currently taking a medication two days prior to her chemotherapy treatment, although the specific medication and its purpose are not clearly identified. The patient's bowel movements have improved, with two bowel movements a day reported. The patient is due to start her chemotherapy treatment soon.        Past Medical History:  Diagnosis Date  . Abnormal SPEP 07/26/2016  . Arthritis    knees, hands  . Back pain 07/14/2016  . Bowel obstruction (HCC) 11/2014  . C. difficile diarrhea   . CHF (congestive heart failure) (HCC)   . Colitis   . Congestive heart failure (HCC) 02/24/2015   S/p pacemaker  . Depression   . Elevated sed rate 08/15/2017  . Epigastric pain 03/22/2017  . GERD (gastroesophageal reflux disease)   . Heart disease   . Hyperlipidemia   . Hypertension   . 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 Iron deficiency anemia due to chronic blood loss 05/28/2022  . Low back pain 07/14/2016  . 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 Multiple myeloma (HCC) 07/20/2018  . Multiple myeloma not having achieved remission (HCC) 07/20/2018  . Myalgia 12/31/2016  . Obstructive sleep apnea 03/31/2015  . Presence of permanent cardiac pacemaker   . SOB (shortness of breath)  04/09/2016  . Stroke (HCC)    TIAs  . UTI (urinary tract infection)     Past Surgical History:  Procedure Laterality Date  . ABDOMINAL HYSTERECTOMY     menorraghia, 2006, total  . BIOPSY  09/01/2022   Procedure: BIOPSY;  Surgeon: Lynann Bologna, MD;  Location: [facility];  Service: Gastroenterology;;  . [place] [facility]    . COLONOSCOPY  2018   [facility] per patient  . COLONOSCOPY WITH PROPOFOL N/A 09/01/2022   Procedure: COLONOSCOPY WITH PROPOFOL;  Surgeon: Lynann Bologna, MD;  Location: [facility];  Service: Gastroenterology;  Laterality: N/A;  . ESOPHAGOGASTRODUODENOSCOPY  2018   [facility]   . ESOPHAGOGASTRODUODENOSCOPY (EGD) WITH PROPOFOL N/A 09/01/2022   Procedure: ESOPHAGOGASTRODUODENOSCOPY (EGD) WITH PROPOFOL;  Surgeon: Lynann Bologna, MD;  Location: [facility];  Service: Gastroenterology;  Laterality: N/A;  . IR IMAGING GUIDED PORT INSERTION  06/29/2023  . KNEE SURGERY     right, repair torn torn cartialage  . PACEMAKER INSERTION  08/2014   changed last in 2023 [facility] pt at [facility]  . TONSILLECTOMY    . TUBAL LIGATION      Family History  Problem Relation Age of Onset  . Diabetes Mother   . Hypertension Mother   . Heart disease Mother        s/p 1 stent  . Hyperlipidemia Mother   . Arthritis Mother   . Cancer Father        COLON  . Colon cancer Father 63  . Irritable bowel syndrome Sister   .  Hyperlipidemia Daughter   . Hypertension Daughter   . Hypertension Maternal Grandmother   . Arthritis Maternal Grandmother   . Heart disease Maternal Grandfather        MI  . Hypertension Maternal Grandfather   . Arthritis Maternal Grandfather   . Hypertension Son   . Esophageal cancer Neg Hx     Social History   Socioeconomic History  . Marital status: Divorced    Spouse name: [name]  . Number of children: 3  . Years of education: [name]  . Highest education level: High school graduate  Occupational History  . [name]   Tobacco Use  . Smoking status: Never    Passive exposure: Never  . Smokeless tobacco: Never  Vaping Use  . Vaping status: Never Used  Substance and Sexual Activity  . Alcohol use: No  . Drug use: No  . Sexual activity: Not Currently  Other Topics Concern  . [name]  Social History Narrative   Lives at home alone   Retired   Caffeine: coffee   Social Determinants of Health   Financial Resource Strain: Medium Risk (07/08/2023)   Overall Financial Resource Strain (CARDIA)   . Difficulty of Paying Living Expenses: Somewhat hard  Food Insecurity: No Food Insecurity (03/28/2023)   Hunger Vital Sign   . Worried About Programme researcher, broadcasting/film/video in the Last Year: Never true   . Ran Out of Food in the Last Year: Never true  Transportation Needs: No Transportation Needs (03/28/2023)   PRAPARE - Transportation   . Lack of Transportation (Medical): No   . Lack of Transportation (Non-Medical): No  Physical Activity: Inactive (07/08/2023)   Exercise Vital Sign   . Days of Exercise per Week: 0 days   . Minutes of Exercise per Session: 0 min  Stress: No Stress Concern Present (01/21/2023)   [employer] of Occupational Health - Occupational Stress Questionnaire   . Feeling of Stress : Not at all  Social Connections: Moderately Isolated (07/08/2023)   Social Connection and Isolation Panel [NHANES]   . Frequency of Communication with Friends and Family: More than three times a week   . Frequency of Social Gatherings with Friends and Family: Twice a week   . Attends Religious Services: More than 4 times per year   . Active Member of Clubs or Organizations: No   . Attends Banker Meetings: Never   . Marital Status: Divorced  Catering manager Violence: Not At Risk (03/28/2023)   Humiliation, Afraid, Rape, and Kick questionnaire   . Fear of Current or Ex-Partner: No   . Emotionally Abused: No   . Physically Abused: No   . Sexually Abused: No    Outpatient Medications Prior to  Visit  Medication Sig Dispense Refill  . acyclovir (ZOVIRAX) 400 MG tablet Take 1 tablet (400 mg total) by mouth 2 (two) times daily. 60 tablet 3  . albuterol (PROVENTIL) (2.5 MG/3ML) 0.083% nebulizer solution Take 3 mLs (2.5 mg total) by nebulization every 6 (six) hours as needed for wheezing or shortness of breath. 150 mL 2  . albuterol (VENTOLIN HFA) 108 (90 Base) MCG/ACT inhaler Inhale 2 puffs into the lungs every 6 (six) hours as needed for wheezing or shortness of breath. 18 g 5  . ALPRAZolam (XANAX) 0.25 MG tablet Take 1-2 tablets (0.25-0.5 mg total) by mouth 3 (three) times daily as needed for anxiety. 40 tablet 1  . amLODipine (NORVASC) 5 MG tablet Take  1 tablet (5 mg total) by mouth daily. 30 tablet 4  . aspirin EC 81 MG tablet Take 81 mg by mouth daily. Swallow whole.    . budesonide (PULMICORT) 0.5 MG/2ML nebulizer solution Take 2 mLs (0.5 mg total) by nebulization 2 (two) times daily as needed (cough, wheezing or shortness of breath). 120 mL 11  . celecoxib (CELEBREX) 100 MG capsule Take 100 mg by mouth 2 (two) times daily.    . cetirizine (ZYRTEC) 10 MG tablet Take 10 mg by mouth at bedtime.    . Cholecalciferol (VITAMIN D3) 50 MCG (2000 UT) TABS Take 2,000 Units by mouth in the morning.    . cholestyramine light (PREVALITE) 4 g packet Take 4 g by mouth 2 (two) times daily.    . diphenoxylate-atropine (LOMOTIL) 2.5-0.025 MG tablet Take 1 tablet by mouth 2 (two) times daily. 60 tablet 6  . ferrous sulfate (FEROSUL) 325 (65 FE) MG tablet TAKE 1 TABLET BY MOUTH DAILY WITH BREAKFAST 30 tablet 2  . fluticasone (FLONASE) 50 MCG/ACT nasal spray Place 2 sprays into both nostrils daily. 16 g 0  . furosemide (LASIX) 20 MG tablet Take 20 mg by mouth daily as needed (fluid retention (feet swelling)).    . hydrOXYzine (ATARAX) 10 MG tablet TAKE ONE (1) TABLET BY MOUTH 3 TIMES DAILY AS NEEDED FOR ITCHING 30 tablet 2  . hyoscyamine (LEVSIN SL) 0.125 MG SL tablet Place 1 tablet (0.125 mg total) under  the tongue every 4 (four) hours as needed. 30 tablet 1  . lidocaine-prilocaine (EMLA) cream Apply 1 Application topically as needed. 30 g 0  . loperamide (IMODIUM) 2 MG capsule Take 4 mg by mouth in the morning and at bedtime.    Marland Kitchen losartan (COZAAR) 50 MG tablet Take 50 mg by mouth in the morning and at bedtime.    . Magnesium Glycinate 100 MG CAPS Take 200 mg by mouth at bedtime.    . meclizine (ANTIVERT) 12.5 MG tablet Take 12.5 mg by mouth 2 (two) times daily as needed for dizziness or nausea.    . metoprolol succinate (TOPROL-XL) 50 MG 24 hr tablet Take 1 tablet (50 mg total) by mouth 2 (two) times daily. (Patient taking differently: Take 50 mg by mouth daily.) 60 tablet 2  . montelukast (SINGULAIR) 10 MG tablet Take 1 tablet (10 mg total) by mouth at bedtime. Please start taking this on September 4. 30 tablet 3  . Multiple Vitamin (QUINTABS) TABS Take 1 tablet by mouth daily.    . Multiple Vitamins-Minerals (CENTRUM SILVER WOMEN 50+ PO) Take 1 tablet by mouth daily at 6 (six) AM.    . nortriptyline (PAMELOR) 10 MG capsule Take 1 capsule (10 mg total) by mouth at bedtime. 30 capsule 1  . ondansetron (ZOFRAN) 8 MG tablet Take 1 tablet (8 mg total) by mouth every 8 (eight) hours as needed for nausea or vomiting. 30 tablet 1  . oxyCODONE-acetaminophen (PERCOCET/ROXICET) 5-325 MG tablet Take 1 tablet by mouth every 6 (six) hours as needed for severe pain.    . potassium chloride SA (KLOR-CON M) 20 MEQ tablet Take 1 tablet (20 mEq total) by mouth daily. 90 tablet 1  . Probiotic Product (ALIGN) 4 MG CAPS Take 1 capsule (4 mg total) by mouth daily. 30 capsule 5  . prochlorperazine (COMPAZINE) 10 MG tablet Take 0.5 tablets (5 mg total) by mouth 2 (two) times daily as needed for nausea or vomiting. 60 tablet 1  . Saccharomyces boulardii (FLORASTOR PO) Take  500 mg by mouth daily at 6 (six) AM.    . scopolamine (TRANSDERM-SCOP) 1 MG/3DAYS Place 1 patch (1.5 mg total) onto the skin every 3 (three) days.  (Patient not taking: Reported on 07/16/2023) 2 patch 0  . scopolamine (TRANSDERM-SCOP) 1 MG/3DAYS Place 1 patch (1.5 mg total) onto the skin every 3 (three) days. (Patient not taking: Reported on 07/16/2023) 10 patch 1  . sertraline (ZOLOFT) 50 MG tablet Take 1 tablet (50 mg total) by mouth daily. 30 tablet 3  . spironolactone (ALDACTONE) 25 MG tablet Take 12.5 mg by mouth daily.    Marland Kitchen dicyclomine (BENTYL) 10 MG capsule Take 1 capsule (10 mg total) by mouth 4 (four) times daily -  before meals and at bedtime. (Patient taking differently: Take 10 mg by mouth 4 (four) times daily -  before meals and at bedtime. cramping) 120 capsule 2   Facility-Administered Medications Prior to Visit  Medication Dose Route Frequency Provider Last Rate Last Admin  . nitroGLYCERIN (NITROSTAT) SL tablet 0.4 mg  0.4 mg Sublingual Once [name], MD        Allergies  Allergen Reactions  . Benazepril Anaphylaxis, Swelling and Hives    angioedema Throat and lip swelling  . Ondansetron Hcl Hives    Redness and hives post IV admin on 07/05/17  . Codeine Nausea And Vomiting  . Morphine Hives    Redness and hives noted post IV admin on 07/05/17    Review of Systems  Constitutional:  Positive for malaise/fatigue. Negative for fever.  HENT:  Negative for congestion.   Eyes:  Negative for blurred vision.  Respiratory:  Negative for shortness of breath.   Cardiovascular:  Negative for chest pain, palpitations and leg swelling.  Gastrointestinal:  Negative for abdominal pain, blood in stool and nausea.  Genitourinary:  Negative for dysuria and frequency.  Musculoskeletal:  Negative for falls.  Skin:  Negative for rash.  Neurological:  Negative for dizziness, loss of consciousness and headaches.  Endo/Heme/Allergies:  Negative for environmental allergies.  Psychiatric/Behavioral:  Negative for depression. The patient is nervous/anxious.       Objective:    Physical Exam Constitutional:      General: She is  not in acute distress.    Appearance: Normal appearance. She is well-developed. She is not toxic-appearing.  HENT:     Head: Normocephalic and atraumatic.     Right Ear: External ear normal.     Left Ear: External ear normal.     Nose: Nose normal.  Eyes:     General:        Right eye: No discharge.        Left eye: No discharge.     Conjunctiva/sclera: Conjunctivae normal.  Neck:     Thyroid: No thyromegaly.  Cardiovascular:     Rate and Rhythm: Normal rate and regular rhythm.     Heart sounds: Normal heart sounds. No murmur heard. Pulmonary:     Effort: Pulmonary effort is normal. No respiratory distress.     Breath sounds: Normal breath sounds.  Abdominal:     General: Bowel sounds are normal.     Palpations: Abdomen is soft.     Tenderness: There is no abdominal tenderness. There is no guarding.  Musculoskeletal:        General: Normal range of motion.     Cervical back: Neck supple.  Lymphadenopathy:     Cervical: No cervical adenopathy.  Skin:    General: Skin is warm and  dry.  Neurological:     Mental Status: She is alert and oriented to person, place, and time.  Psychiatric:        Mood and Affect: Mood normal.        Behavior: Behavior normal.        Thought Content: Thought content normal.        Judgment: Judgment normal.   BP 126/82 (BP Location: Left Arm, Patient Position: Sitting, Cuff Size: Normal)   Pulse 97   Temp 98 F (36.7 C) (Oral)   Resp 16   Ht 5\' 6"  (1.676 m)   Wt 192 lb (87.1 kg)   SpO2 98%   BMI 30.99 kg/m  Wt Readings from Last 3 Encounters:  07/19/23 191 lb 12.8 oz (87 kg)  07/16/23 192 lb 1.6 oz (87.1 kg)  07/15/23 192 lb (87.1 kg)    Diabetic Foot Exam - Simple   No data filed    Lab Results  Component Value Date   WBC 2.7 (L) 07/19/2023   HGB 12.1 07/19/2023   HCT 37.9 07/19/2023   PLT 109 (L) 07/19/2023   GLUCOSE 100 (H) 07/19/2023   CHOL 179 06/14/2023   TRIG 94.0 06/14/2023   HDL 46.90 06/14/2023   LDLCALC 113 (H)  06/14/2023   ALT 26 07/19/2023   AST 34 07/19/2023   NA 136 07/19/2023   K 4.1 07/19/2023   CL 103 07/19/2023   CREATININE 0.60 07/19/2023   BUN 17 07/19/2023   CO2 26 07/19/2023   TSH 1.63 06/14/2023   INR 0.90 07/28/2018   HGBA1C 5.8 06/14/2023    Lab Results  Component Value Date   TSH 1.63 06/14/2023   Lab Results  Component Value Date   WBC 2.7 (L) 07/19/2023   HGB 12.1 07/19/2023   HCT 37.9 07/19/2023   MCV 90.9 07/19/2023   PLT 109 (L) 07/19/2023   Lab Results  Component Value Date   NA 136 07/19/2023   K 4.1 07/19/2023   CHLORIDE 108 12/31/2016   CO2 26 07/19/2023   GLUCOSE 100 (H) 07/19/2023   BUN 17 07/19/2023   CREATININE 0.60 07/19/2023   BILITOT 0.3 07/19/2023   ALKPHOS 46 07/19/2023   AST 34 07/19/2023   ALT 26 07/19/2023   PROT 9.1 (H) 07/19/2023   ALBUMIN 3.2 (L) 07/19/2023   CALCIUM 8.8 (L) 07/19/2023   ANIONGAP 7 07/19/2023   EGFR >90 12/31/2016   GFR 90.08 06/14/2023   Lab Results  Component Value Date   CHOL 179 06/14/2023   Lab Results  Component Value Date   HDL 46.90 06/14/2023   Lab Results  Component Value Date   LDLCALC 113 (H) 06/14/2023   Lab Results  Component Value Date   TRIG 94.0 06/14/2023   Lab Results  Component Value Date   CHOLHDL 4 06/14/2023   Lab Results  Component Value Date   HGBA1C 5.8 06/14/2023       Assessment & Plan:  Cardiomyopathy, unspecified type (HCC) Assessment & Plan: Stable following with cardiology   Clostridium difficile diarrhea Assessment & Plan: She has been following with infectious disease.   Hyperglycemia Assessment & Plan: hgba1c acceptable, minimize simple carbs. Increase exercise as tolerated.    Hyperlipidemia, unspecified hyperlipidemia type Assessment & Plan: Encourage heart healthy diet such as MIND or DASH diet, increase exercise, avoid trans fats, simple carbohydrates and processed foods, consider a krill or fish or flaxseed oil cap daily.    Primary  hypertension Assessment & Plan: Well  controlled, no changes to meds. Encouraged heart healthy diet such as the DASH diet and exercise as tolerated.     Hypogammaglobulinemia (HCC) Assessment & Plan: Is following with oncology   Hypomagnesemia Assessment & Plan: Supplement and monitor    Iron deficiency anemia due to chronic blood loss Assessment & Plan: Increase leafy greens, consider increased lean red meat and using cast iron cookware. Continue to monitor, report any concerns    Need for influenza vaccination -     Flu Vaccine Trivalent High Dose (Fluad)  Multiple myeloma not having achieved remission [facility]) Assessment & Plan: Just had a port placed and she starts new therapy soon     Assessment and Plan    Cancer Patient is scheduled to start chemotherapy treatment. Port placement has been completed. -Continue with planned chemotherapy treatment.  Home Health Care Patient requires additional support at home. Discussed the need for physical and occupational therapy, controlled medications, and IV drug administration via port. -Complete and submit [facility] for Disabled Ad[facility] form. -Order home physical therapy and occupational therapy for unsteady gait and generalized weakness. -Ensure patient's safety at home, particularly in the bathroom due to recurrent falls.  Vaccinations Discussed the need for flu shot and COVID-19 booster. -Administer flu shot today. -Discuss timing of COVID-19 booster with oncologist.  Follow-up Patient to return in 8-10 weeks. -Schedule follow-up appointment.         Danise Edge, MD

## 2023-07-19 NOTE — Assessment & Plan Note (Signed)
Just had a port placed and she starts new therapy soon

## 2023-07-20 ENCOUNTER — Ambulatory Visit: Payer: 59 | Admitting: Internal Medicine

## 2023-07-20 ENCOUNTER — Telehealth: Payer: Self-pay | Admitting: Family Medicine

## 2023-07-20 NOTE — Progress Notes (Unsigned)
Established patient visit   Patient: Tami [name]   DOB: 1956-04-06   67 y.o. Female  MRN: [id number] Visit Date: 07/21/2023  Today's healthcare provider: Alfredia Ferguson, PA-C   No chief complaint on file.  Subjective    HPI  ***  Medications: Outpatient Medications Prior to Visit  Medication Sig   acyclovir (ZOVIRAX) 400 MG tablet Take 1 tablet (400 mg total) by mouth 2 (two) times daily.   albuterol (PROVENTIL) (2.5 MG/3ML) 0.083% nebulizer solution Take 3 mLs (2.5 mg total) by nebulization every 6 (six) hours as needed for wheezing or shortness of breath.   albuterol (VENTOLIN HFA) 108 (90 Base) MCG/ACT inhaler Inhale 2 puffs into the lungs every 6 (six) hours as needed for wheezing or shortness of breath.   ALPRAZolam (XANAX) 0.25 MG tablet Take 1-2 tablets (0.25-0.5 mg total) by mouth 3 (three) times daily as needed for anxiety.   amLODipine (NORVASC) 5 MG tablet Take 1 tablet (5 mg total) by mouth daily.   aspirin EC 81 MG tablet Take 81 mg by mouth daily. Swallow whole.   budesonide (PULMICORT) 0.5 MG/2ML nebulizer solution Take 2 mLs (0.5 mg total) by nebulization 2 (two) times daily as needed (cough, wheezing or shortness of breath).   celecoxib (CELEBREX) 100 MG capsule Take 100 mg by mouth 2 (two) times daily.   cetirizine (ZYRTEC) 10 MG tablet Take 10 mg by mouth at bedtime.   Cholecalciferol (VITAMIN D3) 50 MCG (2000 UT) TABS Take 2,000 Units by mouth in the morning.   cholestyramine light (PREVALITE) 4 g packet Take 4 g by mouth 2 (two) times daily.   dicyclomine (BENTYL) 10 MG capsule Take 1 capsule (10 mg total) by mouth 4 (four) times daily -  before meals and at bedtime. (Patient taking differently: Take 10 mg by mouth 4 (four) times daily -  before meals and at bedtime. cramping)   diphenoxylate-atropine (LOMOTIL) 2.5-0.025 MG tablet Take 1 tablet by mouth 2 (two) times daily.   ferrous sulfate (FEROSUL) 325 (65 FE) MG tablet TAKE 1 TABLET BY MOUTH DAILY  WITH BREAKFAST   fluticasone (FLONASE) 50 MCG/ACT nasal spray Place 2 sprays into both nostrils daily.   furosemide (LASIX) 20 MG tablet Take 20 mg by mouth daily as needed (fluid retention (feet swelling)).   hydrOXYzine (ATARAX) 10 MG tablet TAKE ONE (1) TABLET BY MOUTH 3 TIMES DAILY AS NEEDED FOR ITCHING   hyoscyamine (LEVSIN SL) 0.125 MG SL tablet Place 1 tablet (0.125 mg total) under the tongue every 4 (four) hours as needed.   lidocaine-prilocaine (EMLA) cream Apply 1 Application topically as needed.   loperamide (IMODIUM) 2 MG capsule Take 4 mg by mouth in the morning and at bedtime.   losartan (COZAAR) 50 MG tablet Take 50 mg by mouth in the morning and at bedtime.   Magnesium Glycinate 100 MG CAPS Take 200 mg by mouth at bedtime.   meclizine (ANTIVERT) 12.5 MG tablet Take 12.5 mg by mouth 2 (two) times daily as needed for dizziness or nausea.   metoprolol succinate (TOPROL-XL) 50 MG 24 hr tablet Take 1 tablet (50 mg total) by mouth 2 (two) times daily. (Patient taking differently: Take 50 mg by mouth daily.)   montelukast (SINGULAIR) 10 MG tablet Take 1 tablet (10 mg total) by mouth at bedtime. Please start taking this on September 4.   Multiple Vitamin (QUINTABS) TABS Take 1 tablet by mouth daily.   Multiple Vitamins-Minerals (CENTRUM SILVER WOMEN 50+  PO) Take 1 tablet by mouth daily at 6 (six) AM.   nortriptyline (PAMELOR) 10 MG capsule Take 1 capsule (10 mg total) by mouth at bedtime.   ondansetron (ZOFRAN) 8 MG tablet Take 1 tablet (8 mg total) by mouth every 8 (eight) hours as needed for nausea or vomiting.   oxyCODONE-acetaminophen (PERCOCET/ROXICET) 5-325 MG tablet Take 1 tablet by mouth every 6 (six) hours as needed for severe pain.   potassium chloride SA (KLOR-CON M) 20 MEQ tablet Take 1 tablet (20 mEq total) by mouth daily.   Probiotic Product (ALIGN) 4 MG CAPS Take 1 capsule (4 mg total) by mouth daily.   prochlorperazine (COMPAZINE) 10 MG tablet Take 0.5 tablets (5 mg total)  by mouth 2 (two) times daily as needed for nausea or vomiting.   Saccharomyces boulardii (FLORASTOR PO) Take 500 mg by mouth daily at 6 (six) AM.   scopolamine (TRANSDERM-SCOP) 1 MG/3DAYS Place 1 patch (1.5 mg total) onto the skin every 3 (three) days. (Patient not taking: Reported on 07/16/2023)   scopolamine (TRANSDERM-SCOP) 1 MG/3DAYS Place 1 patch (1.5 mg total) onto the skin every 3 (three) days. (Patient not taking: Reported on 07/16/2023)   sertraline (ZOLOFT) 50 MG tablet Take 1 tablet (50 mg total) by mouth daily.   spironolactone (ALDACTONE) 25 MG tablet Take 12.5 mg by mouth daily.   Facility-Administered Medications Prior to Visit  Medication Dose Route Frequency Provider   nitroGLYCERIN (NITROSTAT) SL tablet 0.4 mg  0.4 mg Sublingual Once [name], MD    Review of Systems {Insert previous labs (optional):23779} {See past labs  Heme  Chem  Endocrine  Serology  Results Review (optional):1}   Objective    There were no vitals taken for this visit. {Insert last BP/Wt (optional):23777}{See vitals history (optional):1}  Physical Exam  ***  No results found for any visits on 07/21/23.  Assessment & Plan     ***  No follow-ups on file.      {provider attestation***:1}   Alfredia Ferguson, PA-C  [facility] [phone] (phone) [phone] (fax)  [facility]

## 2023-07-20 NOTE — Telephone Encounter (Signed)
Called pt back was advised Dr.Blyth out of the office ,but she has appt with Tami [name], [place] advised her to keep appt for tomorrow.

## 2023-07-20 NOTE — Telephone Encounter (Signed)
Pt called to speak to [facility] regarding an antibiotic. She did not want to give me the message to relay beyond it being about an antibiotic. Pt also was told to see PCP in 4-6 weeks but provider has not openings. Advised will send back to CMA to see if she can be added on to the schedule. PCP does not have any openings until December.

## 2023-07-21 ENCOUNTER — Ambulatory Visit (INDEPENDENT_AMBULATORY_CARE_PROVIDER_SITE_OTHER): Payer: 59 | Admitting: Physician Assistant

## 2023-07-21 ENCOUNTER — Other Ambulatory Visit: Payer: Self-pay

## 2023-07-21 ENCOUNTER — Encounter: Payer: Self-pay | Admitting: Physician Assistant

## 2023-07-21 DIAGNOSIS — N3000 Acute cystitis without hematuria: Secondary | ICD-10-CM | POA: Diagnosis not present

## 2023-07-21 DIAGNOSIS — R350 Frequency of micturition: Secondary | ICD-10-CM | POA: Diagnosis not present

## 2023-07-21 DIAGNOSIS — G4733 Obstructive sleep apnea (adult) (pediatric): Secondary | ICD-10-CM | POA: Diagnosis not present

## 2023-07-21 LAB — POCT URINALYSIS DIP (MANUAL ENTRY)
Bilirubin, UA: NEGATIVE
Glucose, UA: NEGATIVE mg/dL
Ketones, POC UA: NEGATIVE mg/dL
Nitrite, UA: POSITIVE — AB
Protein Ur, POC: NEGATIVE mg/dL
Spec Grav, UA: 1.02 (ref 1.010–1.025)
Urobilinogen, UA: 0.2 U/dL
pH, UA: 5 (ref 5.0–8.0)

## 2023-07-21 MED ORDER — SULFAMETHOXAZOLE-TRIMETHOPRIM 800-160 MG PO TABS
1.0000 | ORAL_TABLET | Freq: Two times a day (BID) | ORAL | 0 refills | Status: AC
Start: 2023-07-21 — End: 2023-07-26

## 2023-07-23 ENCOUNTER — Inpatient Hospital Stay: Payer: 59

## 2023-07-23 ENCOUNTER — Telehealth: Payer: Self-pay

## 2023-07-23 ENCOUNTER — Other Ambulatory Visit: Payer: Self-pay | Admitting: Hematology & Oncology

## 2023-07-23 VITALS — BP 149/89 | HR 83 | Resp 17

## 2023-07-23 DIAGNOSIS — C9000 Multiple myeloma not having achieved remission: Secondary | ICD-10-CM

## 2023-07-23 DIAGNOSIS — Z5112 Encounter for antineoplastic immunotherapy: Secondary | ICD-10-CM | POA: Diagnosis not present

## 2023-07-23 DIAGNOSIS — Z79899 Other long term (current) drug therapy: Secondary | ICD-10-CM | POA: Diagnosis not present

## 2023-07-23 DIAGNOSIS — C9002 Multiple myeloma in relapse: Secondary | ICD-10-CM

## 2023-07-23 LAB — CMP (CANCER CENTER ONLY)
ALT: 17 U/L (ref 0–44)
AST: 22 U/L (ref 15–41)
Albumin: 3.7 g/dL (ref 3.5–5.0)
Alkaline Phosphatase: 39 U/L (ref 38–126)
Anion gap: 7 (ref 5–15)
BUN: 16 mg/dL (ref 8–23)
CO2: 26 mmol/L (ref 22–32)
Calcium: 9.3 mg/dL (ref 8.9–10.3)
Chloride: 105 mmol/L (ref 98–111)
Creatinine: 0.93 mg/dL (ref 0.44–1.00)
GFR, Estimated: >60 mL/min (ref >60)
Glucose, Bld: 150 mg/dL — ABNORMAL HIGH (ref 70–99)
Potassium: 3.7 mmol/L (ref 3.5–5.1)
Sodium: 138 mmol/L (ref 135–145)
Total Bilirubin: 0.4 mg/dL (ref 0.3–1.2)
Total Protein: 8.8 g/dL — ABNORMAL HIGH (ref 6.5–8.1)

## 2023-07-23 LAB — CBC WITH DIFFERENTIAL (CANCER CENTER ONLY)
Abs Immature Granulocytes: 0.02 10*3/uL (ref 0.00–0.07)
Basophils Absolute: 0 10*3/uL (ref 0.0–0.1)
Basophils Relative: 1 %
Eosinophils Absolute: 0.1 10*3/uL (ref 0.0–0.5)
Eosinophils Relative: 2 %
HCT: 35.8 % — ABNORMAL LOW (ref 36.0–46.0)
Hemoglobin: 11.5 g/dL — ABNORMAL LOW (ref 12.0–15.0)
Immature Granulocytes: 1 %
Lymphocytes Relative: 22 %
Lymphs Abs: 0.7 10*3/uL (ref 0.7–4.0)
MCH: 29.3 pg (ref 26.0–34.0)
MCHC: 32.1 g/dL (ref 30.0–36.0)
MCV: 91.1 fL (ref 80.0–100.0)
Monocytes Absolute: 0.2 10*3/uL (ref 0.1–1.0)
Monocytes Relative: 6 %
Neutro Abs: 2.3 10*3/uL (ref 1.7–7.7)
Neutrophils Relative %: 68 %
Platelet Count: 124 10*3/uL — ABNORMAL LOW (ref 150–400)
RBC: 3.93 MIL/uL (ref 3.87–5.11)
RDW: 14.3 % (ref 11.5–15.5)
WBC Count: 3.3 10*3/uL — ABNORMAL LOW (ref 4.0–10.5)
nRBC: 0 % (ref 0.0–0.2)

## 2023-07-23 LAB — URINE CULTURE
MICRO NUMBER:: 15452099
SPECIMEN QUALITY:: ADEQUATE

## 2023-07-23 MED ORDER — SODIUM CHLORIDE 0.9 % IV SOLN
20.0000 mg | Freq: Once | INTRAVENOUS | Status: AC
  Administered 2023-07-23: 20 mg via INTRAVENOUS
  Filled 2023-07-23: qty 20

## 2023-07-23 MED ORDER — HEPARIN SOD (PORK) LOCK FLUSH 100 UNIT/ML IV SOLN
500.0000 [IU] | Freq: Once | INTRAVENOUS | Status: AC | PRN
  Administered 2023-07-23: 500 [IU]

## 2023-07-23 MED ORDER — SODIUM CHLORIDE 0.9 % IV SOLN
10.0000 mg/kg | Freq: Once | INTRAVENOUS | Status: AC
  Administered 2023-07-23: 900 mg via INTRAVENOUS
  Filled 2023-07-23: qty 25

## 2023-07-23 MED ORDER — DEXTROSE 5 % IV SOLN
56.0000 mg/m2 | Freq: Once | INTRAVENOUS | Status: AC
  Administered 2023-07-23: 110 mg via INTRAVENOUS
  Filled 2023-07-23: qty 30

## 2023-07-23 MED ORDER — SODIUM CHLORIDE 0.9% FLUSH
10.0000 mL | Freq: Once | INTRAVENOUS | Status: AC
  Administered 2023-07-23: 10 mL via INTRAVENOUS

## 2023-07-23 MED ORDER — DIPHENHYDRAMINE HCL 50 MG/ML IJ SOLN
50.0000 mg | Freq: Once | INTRAMUSCULAR | Status: AC
  Administered 2023-07-23: 50 mg via INTRAVENOUS
  Filled 2023-07-23: qty 1

## 2023-07-23 MED ORDER — SODIUM CHLORIDE 0.9 % IV SOLN: Freq: Once | INTRAVENOUS | Status: AC

## 2023-07-23 MED ORDER — FAMOTIDINE IN NACL 20-0.9 MG/50ML-% IV SOLN
20.0000 mg | Freq: Once | INTRAVENOUS | Status: AC
  Administered 2023-07-23: 20 mg via INTRAVENOUS
  Filled 2023-07-23: qty 50

## 2023-07-23 MED ORDER — MONTELUKAST SODIUM 10 MG PO TABS
10.0000 mg | ORAL_TABLET | Freq: Once | ORAL | Status: AC
  Administered 2023-07-23: 10 mg via ORAL
  Filled 2023-07-23: qty 1

## 2023-07-23 MED ORDER — ACETAMINOPHEN 325 MG PO TABS
650.0000 mg | ORAL_TABLET | Freq: Once | ORAL | Status: DC
  Filled 2023-07-23: qty 2

## 2023-07-23 NOTE — Telephone Encounter (Signed)
Called pt was advised paperwork was ready and I'm faxing forms, told pt we have her a copy.

## 2023-07-23 NOTE — Patient Instructions (Signed)

## 2023-07-28 ENCOUNTER — Other Ambulatory Visit: Payer: Self-pay

## 2023-07-28 ENCOUNTER — Ambulatory Visit (INDEPENDENT_AMBULATORY_CARE_PROVIDER_SITE_OTHER): Payer: 59 | Admitting: Physician Assistant

## 2023-07-28 ENCOUNTER — Encounter: Payer: Self-pay | Admitting: Physician Assistant

## 2023-07-28 VITALS — BP 120/78 | HR 93 | Temp 97.9°F | Resp 18 | Wt 190.0 lb

## 2023-07-28 DIAGNOSIS — R110 Nausea: Secondary | ICD-10-CM

## 2023-07-28 DIAGNOSIS — M545 Low back pain, unspecified: Secondary | ICD-10-CM | POA: Diagnosis not present

## 2023-07-28 LAB — POCT URINALYSIS DIP (MANUAL ENTRY)
Bilirubin, UA: NEGATIVE
Blood, UA: NEGATIVE
Glucose, UA: NEGATIVE mg/dL
Ketones, POC UA: NEGATIVE mg/dL
Leukocytes, UA: NEGATIVE
Nitrite, UA: NEGATIVE
Protein Ur, POC: NEGATIVE mg/dL
Spec Grav, UA: 1.02 (ref 1.010–1.025)
Urobilinogen, UA: 0.2 U/dL
pH, UA: 6 (ref 5.0–8.0)

## 2023-07-28 NOTE — Progress Notes (Signed)
Established patient visit   Patient: Tami [name]   DOB: [birth year]   67 y.o. Female  MRN: [id number] Visit Date: 07/28/2023  Today's healthcare provider: Alfredia Ferguson, PA-C   Chief Complaint  Patient presents with   Back Pain    Back pain    Urinary Tract Infection    Patient states she was positive for UTI and bladder infection and has completed abx and still havi    Subjective    HPI  Pt was seen 07/21/23 for urinary frequency, seen to have a bladder infection, culture + for Ecoli, sensitive to the bactrim that was prescribed.  Today she feels fatigued, nauseous, having low back pain. She wants to ensure she doesn't still have a bladder infection as she is going on a trip this weekend.   Medications: Outpatient Medications Prior to Visit  Medication Sig   acyclovir (ZOVIRAX) 400 MG tablet Take 1 tablet (400 mg total) by mouth 2 (two) times daily.   albuterol (PROVENTIL) (2.5 MG/3ML) 0.083% nebulizer solution Take 3 mLs (2.5 mg total) by nebulization every 6 (six) hours as needed for wheezing or shortness of breath.   albuterol (VENTOLIN HFA) 108 (90 Base) MCG/ACT inhaler Inhale 2 puffs into the lungs every 6 (six) hours as needed for wheezing or shortness of breath.   ALPRAZolam (XANAX) 0.25 MG tablet Take 1-2 tablets (0.25-0.5 mg total) by mouth 3 (three) times daily as needed for anxiety.   amLODipine (NORVASC) 5 MG tablet Take 1 tablet (5 mg total) by mouth daily.   aspirin EC 81 MG tablet Take 81 mg by mouth daily. Swallow whole.   budesonide (PULMICORT) 0.5 MG/2ML nebulizer solution Take 2 mLs (0.5 mg total) by nebulization 2 (two) times daily as needed (cough, wheezing or shortness of breath).   celecoxib (CELEBREX) 100 MG capsule Take 100 mg by mouth 2 (two) times daily.   cetirizine (ZYRTEC) 10 MG tablet Take 10 mg by mouth at bedtime.   Cholecalciferol (VITAMIN D3) 50 MCG (2000 UT) TABS Take 2,000 Units by mouth in the morning.   cholestyramine light  (PREVALITE) 4 g packet Take 4 g by mouth 2 (two) times daily.   diphenoxylate-atropine (LOMOTIL) 2.5-0.025 MG tablet Take 1 tablet by mouth 2 (two) times daily.   ferrous sulfate (FEROSUL) 325 (65 FE) MG tablet TAKE 1 TABLET BY MOUTH DAILY WITH BREAKFAST   fluticasone (FLONASE) 50 MCG/ACT nasal spray Place 2 sprays into both nostrils daily.   furosemide (LASIX) 20 MG tablet Take 20 mg by mouth daily as needed (fluid retention (feet swelling)).   hydrOXYzine (ATARAX) 10 MG tablet TAKE ONE (1) TABLET BY MOUTH 3 TIMES DAILY AS NEEDED FOR ITCHING   hyoscyamine (LEVSIN SL) 0.125 MG SL tablet Place 1 tablet (0.125 mg total) under the tongue every 4 (four) hours as needed.   lidocaine-prilocaine (EMLA) cream Apply 1 Application topically as needed.   loperamide (IMODIUM) 2 MG capsule Take 4 mg by mouth in the morning and at bedtime.   losartan (COZAAR) 50 MG tablet Take 50 mg by mouth in the morning and at bedtime.   Magnesium Glycinate 100 MG CAPS Take 200 mg by mouth at bedtime.   meclizine (ANTIVERT) 12.5 MG tablet Take 12.5 mg by mouth 2 (two) times daily as needed for dizziness or nausea.   metoprolol succinate (TOPROL-XL) 50 MG 24 hr tablet Take 1 tablet (50 mg total) by mouth 2 (two) times daily. (Patient taking differently: Take 50 mg by  mouth daily.)   montelukast (SINGULAIR) 10 MG tablet Take 1 tablet (10 mg total) by mouth at bedtime. Please start taking this on September 4.   Multiple Vitamin (QUINTABS) TABS Take 1 tablet by mouth daily.   Multiple Vitamins-Minerals (CENTRUM SILVER WOMEN 50+ PO) Take 1 tablet by mouth daily at 6 (six) AM.   nortriptyline (PAMELOR) 10 MG capsule Take 1 capsule (10 mg total) by mouth at bedtime.   ondansetron (ZOFRAN) 8 MG tablet Take 1 tablet (8 mg total) by mouth every 8 (eight) hours as needed for nausea or vomiting.   oxyCODONE-acetaminophen (PERCOCET/ROXICET) 5-325 MG tablet Take 1 tablet by mouth every 6 (six) hours as needed for severe pain.   potassium  chloride SA (KLOR-CON M) 20 MEQ tablet Take 1 tablet (20 mEq total) by mouth daily.   Probiotic Product (ALIGN) 4 MG CAPS Take 1 capsule (4 mg total) by mouth daily.   prochlorperazine (COMPAZINE) 10 MG tablet Take 0.5 tablets (5 mg total) by mouth 2 (two) times daily as needed for nausea or vomiting.   Saccharomyces boulardii (FLORASTOR PO) Take 500 mg by mouth daily at 6 (six) AM.   scopolamine (TRANSDERM-SCOP) 1 MG/3DAYS Place 1 patch (1.5 mg total) onto the skin every 3 (three) days.   scopolamine (TRANSDERM-SCOP) 1 MG/3DAYS Place 1 patch (1.5 mg total) onto the skin every 3 (three) days.   sertraline (ZOLOFT) 50 MG tablet Take 1 tablet (50 mg total) by mouth daily.   spironolactone (ALDACTONE) 25 MG tablet Take 12.5 mg by mouth daily.   dicyclomine (BENTYL) 10 MG capsule Take 1 capsule (10 mg total) by mouth 4 (four) times daily -  before meals and at bedtime. (Patient taking differently: Take 10 mg by mouth 4 (four) times daily -  before meals and at bedtime. cramping)   Facility-Administered Medications Prior to Visit  Medication Dose Route Frequency Provider   nitroGLYCERIN (NITROSTAT) SL tablet 0.4 mg  0.4 mg Sublingual Once [name], MD    Review of Systems  Constitutional:  Positive for fatigue. Negative for fever.  Respiratory:  Negative for cough and shortness of breath.   Cardiovascular:  Negative for chest pain and leg swelling.  Gastrointestinal:  Positive for nausea. Negative for abdominal pain.  Musculoskeletal:  Positive for back pain.  Neurological:  Negative for dizziness and headaches.      Objective    BP 120/78 (BP Location: Left Arm, Patient Position: Sitting, Cuff Size: Normal)   Pulse 93   Temp 97.9 F (36.6 C) (Oral)   Resp 18   Wt 190 lb (86.2 kg)   SpO2 97%   BMI 30.67 kg/m   Physical Exam Vitals reviewed.  Constitutional:      Appearance: She is not ill-appearing.  HENT:     Head: Normocephalic.  Eyes:     Conjunctiva/sclera:  Conjunctivae normal.  Cardiovascular:     Rate and Rhythm: Normal rate.  Pulmonary:     Effort: Pulmonary effort is normal. No respiratory distress.  Neurological:     General: No focal deficit present.     Mental Status: She is alert and oriented to person, place, and time.  Psychiatric:        Mood and Affect: Mood normal.        Behavior: Behavior normal.      Results for orders placed or performed in visit on 07/28/23  POCT urinalysis dipstick  Result Value Ref Range   Color, UA yellow yellow   Clarity,  UA clear clear   Glucose, UA negative negative mg/dL   Bilirubin, UA negative negative   Ketones, POC UA negative negative mg/dL   Spec Grav, UA 5.784 6.962 - 1.025   Blood, UA negative negative   pH, UA 6.0 5.0 - 8.0   Protein Ur, POC negative negative mg/dL   Urobilinogen, UA 0.2 0.2 or 1.0 E.U./dL   Nitrite, UA Negative Negative   Leukocytes, UA Negative Negative    Assessment & Plan     1. Nausea 2. Low back pain, unspecified back pain laterality, unspecified chronicity, unspecified whether sciatica present UA negative.  Recent Uti was treated correctly  Advised pt nausea is likely 2/2 to her ongoing multiple myeloma treatment. Encouraged her to take her antinausea drugs, hydrate. Encouraged her to discuss with oncology at her upcoming appt Friday.  - POCT urinalysis dipstick   Return if symptoms worsen or fail to improve.      I, Alfredia Ferguson, PA-C have reviewed all documentation for this visit. The documentation on  07/28/23   for the exam, diagnosis, procedures, and orders are all accurate and complete.    Alfredia Ferguson, PA-C  [facility] [phone] (phone) [phone] (fax)  [facility]

## 2023-07-30 ENCOUNTER — Inpatient Hospital Stay: Payer: 59

## 2023-07-30 ENCOUNTER — Encounter: Payer: Self-pay | Admitting: Hematology & Oncology

## 2023-07-30 ENCOUNTER — Inpatient Hospital Stay (HOSPITAL_BASED_OUTPATIENT_CLINIC_OR_DEPARTMENT_OTHER): Payer: 59 | Admitting: Hematology & Oncology

## 2023-07-30 VITALS — BP 128/68 | HR 75 | Resp 18

## 2023-07-30 DIAGNOSIS — C9002 Multiple myeloma in relapse: Secondary | ICD-10-CM

## 2023-07-30 DIAGNOSIS — C9000 Multiple myeloma not having achieved remission: Secondary | ICD-10-CM

## 2023-07-30 DIAGNOSIS — Z5112 Encounter for antineoplastic immunotherapy: Secondary | ICD-10-CM | POA: Diagnosis not present

## 2023-07-30 DIAGNOSIS — Z79899 Other long term (current) drug therapy: Secondary | ICD-10-CM | POA: Diagnosis not present

## 2023-07-30 LAB — CMP (CANCER CENTER ONLY)
ALT: 15 U/L (ref 0–44)
AST: 19 U/L (ref 15–41)
Albumin: 3.4 g/dL — ABNORMAL LOW (ref 3.5–5.0)
Alkaline Phosphatase: 39 U/L (ref 38–126)
Anion gap: 8 (ref 5–15)
BUN: 24 mg/dL — ABNORMAL HIGH (ref 8–23)
CO2: 25 mmol/L (ref 22–32)
Calcium: 8.8 mg/dL — ABNORMAL LOW (ref 8.9–10.3)
Chloride: 108 mmol/L (ref 98–111)
Creatinine: 0.71 mg/dL (ref 0.44–1.00)
GFR, Estimated: >60 mL/min (ref >60)
Glucose, Bld: 99 mg/dL (ref 70–99)
Potassium: 3.7 mmol/L (ref 3.5–5.1)
Sodium: 141 mmol/L (ref 135–145)
Total Bilirubin: 0.5 mg/dL (ref 0.3–1.2)
Total Protein: 8.2 g/dL — ABNORMAL HIGH (ref 6.5–8.1)

## 2023-07-30 LAB — CBC WITH DIFFERENTIAL (CANCER CENTER ONLY)
Abs Immature Granulocytes: 0.01 10*3/uL (ref 0.00–0.07)
Basophils Absolute: 0 10*3/uL (ref 0.0–0.1)
Basophils Relative: 0 %
Eosinophils Absolute: 0.1 10*3/uL (ref 0.0–0.5)
Eosinophils Relative: 2 %
HCT: 34.3 % — ABNORMAL LOW (ref 36.0–46.0)
Hemoglobin: 10.9 g/dL — ABNORMAL LOW (ref 12.0–15.0)
Immature Granulocytes: 0 %
Lymphocytes Relative: 23 %
Lymphs Abs: 0.6 10*3/uL — ABNORMAL LOW (ref 0.7–4.0)
MCH: 28.8 pg (ref 26.0–34.0)
MCHC: 31.8 g/dL (ref 30.0–36.0)
MCV: 90.7 fL (ref 80.0–100.0)
Monocytes Absolute: 0.3 10*3/uL (ref 0.1–1.0)
Monocytes Relative: 11 %
Neutro Abs: 1.7 10*3/uL (ref 1.7–7.7)
Neutrophils Relative %: 64 %
Platelet Count: 78 10*3/uL — ABNORMAL LOW (ref 150–400)
RBC: 3.78 MIL/uL — ABNORMAL LOW (ref 3.87–5.11)
RDW: 14.3 % (ref 11.5–15.5)
WBC Count: 2.7 10*3/uL — ABNORMAL LOW (ref 4.0–10.5)
nRBC: 0 % (ref 0.0–0.2)

## 2023-07-30 LAB — LACTATE DEHYDROGENASE: LDH: 190 U/L (ref 98–192)

## 2023-07-30 MED ORDER — MONTELUKAST SODIUM 10 MG PO TABS
10.0000 mg | ORAL_TABLET | Freq: Once | ORAL | Status: AC
  Administered 2023-07-30: 10 mg via ORAL
  Filled 2023-07-30: qty 1

## 2023-07-30 MED ORDER — HEPARIN SOD (PORK) LOCK FLUSH 100 UNIT/ML IV SOLN
500.0000 [IU] | Freq: Once | INTRAVENOUS | Status: AC | PRN
  Administered 2023-07-30: 500 [IU]

## 2023-07-30 MED ORDER — SODIUM CHLORIDE 0.9% FLUSH
10.0000 mL | INTRAVENOUS | Status: DC | PRN
  Administered 2023-07-30: 10 mL

## 2023-07-30 MED ORDER — SODIUM CHLORIDE 0.9 % IV SOLN
10.0000 mg/kg | Freq: Once | INTRAVENOUS | Status: AC
  Administered 2023-07-30: 900 mg via INTRAVENOUS
  Filled 2023-07-30: qty 25

## 2023-07-30 MED ORDER — SODIUM CHLORIDE 0.9 % IV SOLN: Freq: Once | INTRAVENOUS | Status: DC

## 2023-07-30 MED ORDER — DIPHENHYDRAMINE HCL 50 MG/ML IJ SOLN
50.0000 mg | Freq: Once | INTRAMUSCULAR | Status: AC
  Administered 2023-07-30: 50 mg via INTRAVENOUS
  Filled 2023-07-30: qty 1

## 2023-07-30 MED ORDER — NEOMYCIN-POLYMYXIN-HC 3.5-10000-1 OT SOLN: 4.0000 [drp] | Freq: Four times a day (QID) | OTIC | 4 refills | Status: DC

## 2023-07-30 MED ORDER — FAMOTIDINE IN NACL 20-0.9 MG/50ML-% IV SOLN
20.0000 mg | Freq: Once | INTRAVENOUS | Status: AC
  Administered 2023-07-30: 20 mg via INTRAVENOUS
  Filled 2023-07-30: qty 50

## 2023-07-30 MED ORDER — SODIUM CHLORIDE 0.9 % IV SOLN
20.0000 mg | Freq: Once | INTRAVENOUS | Status: AC
  Administered 2023-07-30: 20 mg via INTRAVENOUS
  Filled 2023-07-30: qty 20

## 2023-07-30 MED ORDER — ACETAMINOPHEN 325 MG PO TABS
650.0000 mg | ORAL_TABLET | Freq: Once | ORAL | Status: DC
  Filled 2023-07-30: qty 2

## 2023-07-30 MED ORDER — SODIUM CHLORIDE 0.9 % IV SOLN: Freq: Once | INTRAVENOUS | Status: AC

## 2023-07-30 NOTE — Progress Notes (Signed)
Hematology and Oncology Follow Up Visit  Tami [name] [id number] 08-06-1956 67 y.o. 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  So far, she is doing quite nicely.  She has had no problems with the chemotherapy.  She is ready for her cruise.  She will leave tomorrow.  I am so happy that she is going to go.  We have had to believe that everything is working.  Her total protein is down to 8.2 already.  She has had no problems with diarrhea.  Her appetite is good.  She has had no nausea or vomiting.    Overall, I would say performance status is probably ECOG 1.    Medications:  Allergies as of 07/30/2023       Reactions   Benazepril Anaphylaxis, Swelling, Hives   angioedema Throat and lip swelling   Ondansetron Hcl Hives   Redness and hives post IV admin on 07/05/17   Codeine Nausea And Vomiting   Morphine Hives   Redness and hives noted post IV admin on 07/05/17        Medication List        Accurate as of July 30, 2023 10:25 AM. If you have any questions, ask your nurse or doctor.          acetaminophen 500 MG tablet Commonly known as: TYLENOL Take 500 mg by mouth every 6 (six) hours as needed.   acyclovir 400 MG tablet Commonly known as: ZOVIRAX Take 1 tablet (400 mg total) by mouth 2 (two) times daily.   albuterol 108 (90 Base) MCG/ACT inhaler Commonly known as: VENTOLIN HFA Inhale 2 puffs into the lungs every 6 (six) hours as needed for wheezing or shortness of breath.   albuterol (2.5  MG/3ML) 0.083% nebulizer solution Commonly known as: PROVENTIL Take 3 mLs (2.5 mg total) by nebulization every 6 (six) hours as needed for wheezing or shortness of breath.   ALPRAZolam 0.25 MG tablet Commonly known as: XANAX Take 1-2 tablets (0.25-0.5 mg total) by mouth 3 (three) times daily as needed for anxiety.   amLODipine 5 MG tablet Commonly known as: NORVASC Take 1 tablet (5 mg total) by mouth daily.   aspirin EC 81 MG tablet Take 81 mg by mouth daily. Swallow whole.   budesonide 0.5 MG/2ML nebulizer solution Commonly known as: Pulmicort Take 2 mLs (0.5 mg total) by nebulization 2 (two) times daily as needed (cough, wheezing or shortness of breath).   celecoxib 100 MG capsule Commonly known as: CELEBREX Take 100 mg by mouth 2 (two) times daily.   CENTRUM SILVER WOMEN 50+ PO Take 1 tablet by mouth daily at 6 (six) AM.   cetirizine 10 MG tablet Commonly known as: ZYRTEC Take 10 mg by mouth at bedtime.   cholestyramine light 4 g packet Commonly known as: PREVALITE Take 4 g by mouth 2 (two) times daily.   dicyclomine 10 MG capsule Commonly known as: BENTYL Take 1 capsule (10 mg total) by mouth 4 (four) times daily -  before meals and at bedtime. What changed: additional instructions   diphenoxylate-atropine 2.5-0.025 MG tablet Commonly known as: Lomotil Take 1 tablet by mouth 2 (two) times daily.   FeroSul 325 (65 Fe) MG tablet Generic drug: ferrous sulfate TAKE 1 TABLET BY MOUTH DAILY WITH BREAKFAST   FLORASTOR PO Take 500 mg by mouth daily at 6 (six) AM.   fluticasone 50 MCG/ACT nasal spray Commonly known as: FLONASE Place 2 sprays into both nostrils daily.   furosemide 20 MG tablet Commonly known as: LASIX Take 20 mg by mouth daily as needed (fluid retention (feet swelling)).   hydrOXYzine 10 MG tablet Commonly known as: ATARAX TAKE ONE (1) TABLET BY MOUTH 3 TIMES DAILY AS NEEDED FOR ITCHING   hyoscyamine 0.125 MG SL tablet Commonly known as:  LEVSIN SL Place 1 tablet (0.125 mg total) under the tongue every 4 (four) hours as needed.   lidocaine-prilocaine cream Commonly known as: EMLA Apply 1 Application topically as needed.   loperamide 2 MG capsule Commonly known as: IMODIUM Take 4 mg by mouth in the morning and at bedtime.   losartan 50 MG tablet Commonly known as: COZAAR Take 50 mg by mouth in the morning and at bedtime.   Magnesium Glycinate 100 MG Caps Take 200 mg by mouth at bedtime.   meclizine 12.5 MG tablet Commonly known as: ANTIVERT Take 12.5 mg by mouth 2 (two) times daily as needed for dizziness or nausea.   metoprolol succinate 50 MG 24 hr tablet Commonly known as: TOPROL-XL Take 1 tablet (50 mg total) by mouth 2 (two) times daily. What changed: when to take this   montelukast 10 MG tablet Commonly known as: Singulair Take 1 tablet (10 mg total) by mouth at bedtime. Please start taking this on September 4.   nortriptyline 10 MG capsule Commonly known as: Pamelor Take 1 capsule (10 mg total) by mouth at bedtime.   ondansetron 8 MG tablet Commonly known as: Zofran Take 1 tablet (8 mg total) by mouth every 8 (eight) hours as needed for nausea or vomiting.   oxyCODONE-acetaminophen 5-325 MG tablet Commonly known as: PERCOCET/ROXICET Take 1 tablet by mouth every 6 (six) hours as needed for severe pain.   potassium chloride SA 20 MEQ tablet Commonly known as: KLOR-CON M Take 1 tablet (20 mEq total) by mouth daily.   prochlorperazine 10 MG tablet Commonly known as: COMPAZINE Take 0.5 tablets (5 mg total) by mouth 2 (two) times daily as needed for nausea or vomiting.   scopolamine 1 MG/3DAYS Commonly known as: TRANSDERM-SCOP Place 1 patch (1.5 mg total) onto the skin every 3 (three) days.   scopolamine 1 MG/3DAYS Commonly known as: TRANSDERM-SCOP Place 1 patch (1.5 mg total) onto the skin every 3 (three) days.   sertraline 50 MG tablet Commonly known as: ZOLOFT Take 1 tablet (50 mg total)  by mouth daily.   spironolactone 25 MG tablet Commonly known as: ALDACTONE Take 25 mg by mouth daily.   Vitamin D3 50 MCG (2000 UT) Tabs Take 2,000 Units by mouth in the morning.        Allergies:  Allergies  Allergen Reactions   Benazepril Anaphylaxis, Swelling and Hives    angioedema Throat and lip swelling   Ondansetron Hcl Hives    Redness and hives post IV admin on 07/05/17   Codeine Nausea And Vomiting   Morphine Hives    Redness and hives noted post IV admin on 07/05/17    Past Medical History, Surgical history, Social  history, and Family History were reviewed and updated.  Review of Systems: Review of Systems  Constitutional: Negative.   HENT: Negative.    Eyes: Negative.   Respiratory: Negative.    Cardiovascular: Negative.   Gastrointestinal: Negative.   Genitourinary: Negative.   Musculoskeletal: Negative.   Skin: Negative.   Neurological: Negative.   Endo/Heme/Allergies: Negative.   Psychiatric/Behavioral: Negative.        Physical Exam: Temperature is 97.9.  Pulse 90.  Blood pressure 137/76.  Weight is 191 pounds.    Wt Readings from Last 3 Encounters:  07/30/23 191 lb (86.6 kg)  07/28/23 190 lb (86.2 kg)  07/23/23 191 lb (86.6 kg)    Physical Exam Vitals reviewed.  HENT:     Head: Normocephalic and atraumatic.  Eyes:     Pupils: Pupils are equal, round, and reactive to light.     Comments: Mild pallor of conjunctiva  Cardiovascular:     Rate and Rhythm: Normal rate and regular rhythm.     Heart sounds: Normal heart sounds.  Pulmonary:     Effort: Pulmonary effort is normal.     Breath sounds: Normal breath sounds.  Abdominal:     General: Bowel sounds are normal.     Palpations: Abdomen is soft.  Musculoskeletal:        General: No tenderness or deformity. Normal range of motion.     Cervical back: Normal range of motion.  Lymphadenopathy:     Cervical: No cervical adenopathy.  Skin:    General: Skin is warm and dry.      Findings: No erythema or rash.  Neurological:     Mental Status: She is alert and oriented to person, place, and time.  Psychiatric:        Behavior: Behavior normal.        Thought Content: Thought content normal.        Judgment: Judgment normal.      Lab Results  Component Value Date   WBC 2.7 (L) 07/30/2023   HGB 10.9 (L) 07/30/2023   HCT 34.3 (L) 07/30/2023   MCV 90.7 07/30/2023   PLT 78 (L) 07/30/2023   Lab Results  Component Value Date   FERRITIN 288 06/24/2023   IRON 86 06/24/2023   TIBC 335 06/24/2023   UIBC 249 06/24/2023   IRONPCTSAT 26 06/24/2023   Lab Results  Component Value Date   RETICCTPCT 1.2 11/27/2022   RBC 3.78 (L) 07/30/2023   RETICCTABS 32,640 07/16/2016   Lab Results  Component Value Date   KPAFRELGTCHN 64.9 (H) 07/01/2023   LAMBDASER 8.2 07/01/2023   KAPLAMBRATIO 7.22 07/05/2023   Lab Results  Component Value Date   IGGSERUM 4,402 (H) 07/01/2023   IGA 198 07/01/2023   IGMSERUM 133 07/01/2023   Lab Results  Component Value Date   TOTALPROTELP 9.2 (H) 07/01/2023   ALBUMINELP 3.4 07/01/2023   A1GS 0.3 07/01/2023   A2GS 1.0 07/01/2023   BETS 1.2 07/01/2023   BETA2SER 0.5 07/16/2016   GAMS 3.3 (H) 07/01/2023   MSPIKE 2.7 (H) 07/01/2023   SPEI Comment 10/27/2022     Chemistry      Component Value Date/Time   NA 141 07/30/2023 0933   NA 143 11/08/2017 1127   NA 140 12/31/2016 1000   K 3.7 07/30/2023 0933   K 4.0 11/08/2017 1127   K 3.6 12/31/2016 1000   CL 108 07/30/2023 0933   CL 107 11/08/2017 1127   CO2 25 07/30/2023 0933   CO2  25 11/08/2017 1127   CO2 24 12/31/2016 1000   BUN 24 (H) 07/30/2023 0933   BUN 19 11/08/2017 1127   BUN 22.3 12/31/2016 1000   CREATININE 0.71 07/30/2023 0933   CREATININE 0.9 11/08/2017 1127   CREATININE 0.8 12/31/2016 1000      Component Value Date/Time   CALCIUM 8.8 (L) 07/30/2023 0933   CALCIUM 9.9 11/08/2017 1127   CALCIUM 9.5 12/31/2016 1000   ALKPHOS 39 07/30/2023 0933   ALKPHOS 44  11/08/2017 1127   ALKPHOS 51 12/31/2016 1000   AST 19 07/30/2023 0933   AST 28 12/31/2016 1000   ALT 15 07/30/2023 0933   ALT 36 11/08/2017 1127   ALT 16 12/31/2016 1000   BILITOT 0.5 07/30/2023 0933   BILITOT 0.46 12/31/2016 1000       Impression and Plan: Ms. Hessing is a very pleasant 67 yo African American female with a previous IgG kappa MGUS with progression to myeloma.  We will go ahead and start her chemotherapy now.  We will go ahead and adjust her of chemotherapy for the first cycle.  She will be going on a cruise this weekend.  Again, I do not see a reason why she cannot go on the cruise.  I know she will enjoy herself quite a bit.  We will plan to get her back for the start of her second cycle in a couple weeks.     Josph Macho, MD 9/20/202410:25 AM

## 2023-07-30 NOTE — Patient Instructions (Signed)
[facility]  Discharge Instructions: Thank you for choosing [facility] to provide your oncology and hematology care.   If you have a lab appointment with the Cancer Center, please go directly to the Cancer Center and check in at the registration area.  Wear comfortable clothing and clothing appropriate for easy access to any Portacath or PICC line.   We strive to give you quality time with your provider. You may need to reschedule your appointment if you arrive late (15 or more minutes).  Arriving late affects you and other patients whose appointments are after yours.  Also, if you miss three or more appointments without notifying the office, you may be dismissed from the clinic at the provider's discretion.      For prescription refill requests, have your pharmacy contact our office and allow 72 hours for refills to be completed.    Today you received the following chemotherapy and/or immunotherapy agents Sarclisa.      To help prevent nausea and vomiting after your treatment, we encourage you to take your nausea medication as directed.  BELOW ARE SYMPTOMS THAT SHOULD BE REPORTED IMMEDIATELY: *FEVER GREATER THAN 100.4 F (38 C) OR HIGHER *CHILLS OR SWEATING *NAUSEA AND VOMITING THAT IS NOT CONTROLLED WITH YOUR NAUSEA MEDICATION *UNUSUAL SHORTNESS OF BREATH *UNUSUAL BRUISING OR BLEEDING *URINARY PROBLEMS (pain or burning when urinating, or frequent urination) *BOWEL PROBLEMS (unusual diarrhea, constipation, pain near the anus) TENDERNESS IN MOUTH AND THROAT WITH OR WITHOUT PRESENCE OF ULCERS (sore throat, sores in mouth, or a toothache) UNUSUAL RASH, SWELLING OR PAIN  UNUSUAL VAGINAL DISCHARGE OR ITCHING   Items with * indicate a potential emergency and should be followed up as soon as possible or go to the Emergency Department if any problems should occur.  Please show the CHEMOTHERAPY ALERT CARD or IMMUNOTHERAPY ALERT CARD at  check-in to the Emergency Department and triage nurse. Should you have questions after your visit or need to cancel or reschedule your appointment, please contact [place] [facility]  [phone] and follow the prompts.  Office hours are 8:00 a.m. to 4:30 p.m. Monday - Friday. Please note that voicemails left after 4:00 p.m. may not be returned until the following business day.  We are closed weekends and major holidays. You have access to a nurse at all times for urgent questions. Please call the main number to the clinic [phone] and follow the prompts.  For any non-urgent questions, you may also contact your provider using MyChart. We now offer e-Visits for anyone 28 and older to request care online for non-urgent symptoms. For details visit [email or web address].   Also download the MyChart app! Go to the app store, search "MyChart", open the app, select , and log in with your MyChart username and password.

## 2023-07-30 NOTE — Progress Notes (Signed)
OK to treat with the platelets value from today per Dr. Myna Hidalgo.

## 2023-07-30 NOTE — Patient Instructions (Signed)

## 2023-08-01 LAB — IGG, IGA, IGM
IgA: 103 mg/dL (ref 87–352)
IgG (Immunoglobin G), Serum: 3261 mg/dL — ABNORMAL HIGH (ref 586–1602)
IgM (Immunoglobulin M), Srm: 91 mg/dL (ref 26–217)

## 2023-08-02 LAB — KAPPA/LAMBDA LIGHT CHAINS
Kappa free light chain: 29.1 mg/L — ABNORMAL HIGH (ref 3.3–19.4)
Kappa, lambda light chain ratio: 10.78 — ABNORMAL HIGH (ref 0.26–1.65)
Lambda free light chains: 2.7 mg/L — ABNORMAL LOW (ref 5.7–26.3)

## 2023-08-03 ENCOUNTER — Encounter: Payer: Self-pay | Admitting: Family Medicine

## 2023-08-04 ENCOUNTER — Ambulatory Visit: Payer: 59 | Admitting: Pulmonary Disease

## 2023-08-05 ENCOUNTER — Encounter: Payer: Self-pay | Admitting: *Deleted

## 2023-08-05 LAB — PROTEIN ELECTROPHORESIS, SERUM, WITH REFLEX
A/G Ratio: 0.7 (ref 0.7–1.7)
Albumin ELP: 3.2 g/dL (ref 2.9–4.4)
Alpha-1-Globulin: 0.2 g/dL (ref 0.0–0.4)
Alpha-2-Globulin: 0.9 g/dL (ref 0.4–1.0)
Beta Globulin: 1.1 g/dL (ref 0.7–1.3)
Gamma Globulin: 2.6 g/dL — ABNORMAL HIGH (ref 0.4–1.8)
Globulin, Total: 4.9 g/dL — ABNORMAL HIGH (ref 2.2–3.9)
M-Spike, %: 2.3 g/dL — ABNORMAL HIGH
SPEP Interpretation: 0
Total Protein ELP: 8.1 g/dL (ref 6.0–8.5)

## 2023-08-05 LAB — IMMUNOFIXATION REFLEX, SERUM
IgA: 107 mg/dL (ref 87–352)
IgG (Immunoglobin G), Serum: 3541 mg/dL — ABNORMAL HIGH (ref 586–1602)
IgM (Immunoglobulin M), Srm: 97 mg/dL (ref 26–217)

## 2023-08-06 ENCOUNTER — Inpatient Hospital Stay: Payer: 59

## 2023-08-07 ENCOUNTER — Emergency Department (HOSPITAL_BASED_OUTPATIENT_CLINIC_OR_DEPARTMENT_OTHER): Payer: 59

## 2023-08-07 ENCOUNTER — Other Ambulatory Visit: Payer: Self-pay

## 2023-08-07 ENCOUNTER — Encounter (HOSPITAL_BASED_OUTPATIENT_CLINIC_OR_DEPARTMENT_OTHER): Payer: Self-pay

## 2023-08-07 ENCOUNTER — Emergency Department (HOSPITAL_BASED_OUTPATIENT_CLINIC_OR_DEPARTMENT_OTHER)
Admission: EM | Admit: 2023-08-07 | Discharge: 2023-08-07 | Disposition: A | Discharge status: Home / Self Care | Payer: 59 | Attending: Emergency Medicine | Admitting: Emergency Medicine

## 2023-08-07 DIAGNOSIS — W19XXXA Unspecified fall, initial encounter: Secondary | ICD-10-CM | POA: Insufficient documentation

## 2023-08-07 DIAGNOSIS — S92254A Nondisplaced fracture of navicular [scaphoid] of right foot, initial encounter for closed fracture: Secondary | ICD-10-CM | POA: Insufficient documentation

## 2023-08-07 DIAGNOSIS — S92151A Displaced avulsion fracture (chip fracture) of right talus, initial encounter for closed fracture: Secondary | ICD-10-CM | POA: Diagnosis not present

## 2023-08-07 DIAGNOSIS — S92041A Displaced other fracture of tuberosity of right calcaneus, initial encounter for closed fracture: Secondary | ICD-10-CM | POA: Diagnosis not present

## 2023-08-07 DIAGNOSIS — Z7982 Long term (current) use of aspirin: Secondary | ICD-10-CM | POA: Insufficient documentation

## 2023-08-07 DIAGNOSIS — M25571 Pain in right ankle and joints of right foot: Secondary | ICD-10-CM | POA: Diagnosis not present

## 2023-08-07 DIAGNOSIS — S92014A Nondisplaced fracture of body of right calcaneus, initial encounter for closed fracture: Secondary | ICD-10-CM | POA: Diagnosis not present

## 2023-08-07 DIAGNOSIS — S92001A Unspecified fracture of right calcaneus, initial encounter for closed fracture: Secondary | ICD-10-CM | POA: Diagnosis not present

## 2023-08-07 DIAGNOSIS — S92251A Displaced fracture of navicular [scaphoid] of right foot, initial encounter for closed fracture: Secondary | ICD-10-CM | POA: Diagnosis not present

## 2023-08-07 DIAGNOSIS — S92214A Nondisplaced fracture of cuboid bone of right foot, initial encounter for closed fracture: Secondary | ICD-10-CM | POA: Insufficient documentation

## 2023-08-07 DIAGNOSIS — S92211A Displaced fracture of cuboid bone of right foot, initial encounter for closed fracture: Secondary | ICD-10-CM | POA: Diagnosis not present

## 2023-08-07 MED ORDER — OXYCODONE-ACETAMINOPHEN 5-325 MG PO TABS
2.0000 | ORAL_TABLET | Freq: Once | ORAL | Status: AC
  Administered 2023-08-07: 2 via ORAL
  Filled 2023-08-07: qty 2

## 2023-08-07 MED ORDER — OXYCODONE-ACETAMINOPHEN 5-325 MG PO TABS
2.0000 | ORAL_TABLET | Freq: Four times a day (QID) | ORAL | 0 refills | Status: AC | PRN
Start: 2023-08-07 — End: ?

## 2023-08-07 NOTE — Discharge Instructions (Signed)
You have multiple fractures in your right ankle.  Please take Percocet as prescribed.  Please wear splint and do not bear weight on it  I have talked to the case manager and she will try and deliver wheelchair to you tomorrow.  Otherwise please use a walker for now  Please call Ortho office for follow-up next week  Return to ER if you have severe pain or unable to walk or worse swelling

## 2023-08-07 NOTE — ED Triage Notes (Signed)
The patient stated her knee went out two days ago and she fell. She is having right ankle pain and swelling. No head injury or LOC. No blood thinner use.

## 2023-08-07 NOTE — ED Notes (Signed)
Patient transported to CT 

## 2023-08-07 NOTE — ED Provider Notes (Signed)
[place] [facility] Provider Note   CSN: [id number] Arrival date & time: 08/07/23  1533     History  Chief Complaint  Patient presents with   Fall   Ankle Pain    BRIEA MCENERY is a 67 y.o. female here presenting with right ankle pain.  Patient states that she just came back from a [place] cruise.  She states that 2 days ago she was at the [employer] dinner and then had a dance afterwards.  She states that she had high heels and her leg gave out and she twisted her right ankle.  She states that she had difficulty bearing weight of the right ankle since then.  Patient has been only taking Tylenol for pain.  Denies any previous ankle injury.  Denies any head injury or loss of consciousness.  The history is provided by the patient.       Home Medications Prior to Admission medications   Medication Sig Start Date End Date Taking? Authorizing Provider  acetaminophen (TYLENOL) 500 MG tablet Take 500 mg by mouth every 6 (six) hours as needed.    [provider]  acyclovir (ZOVIRAX) 400 MG tablet Take 1 tablet (400 mg total) by mouth 2 (two) times daily. 04/22/23   Josph Macho, MD  albuterol (PROVENTIL) (2.5 MG/3ML) 0.083% nebulizer solution Take 3 mLs (2.5 mg total) by nebulization every 6 (six) hours as needed for wheezing or shortness of breath. 12/15/22   [name], MD  albuterol (VENTOLIN HFA) 108 (90 Base) MCG/ACT inhaler Inhale 2 puffs into the lungs every 6 (six) hours as needed for wheezing or shortness of breath. 12/07/22   [name], MD  ALPRAZolam Prudy Feeler) 0.25 MG tablet Take 1-2 tablets (0.25-0.5 mg total) by mouth 3 (three) times daily as needed for anxiety. Patient not taking: Reported on 07/30/2023 06/14/23   [name], MD  aspirin EC 81 MG tablet Take 81 mg by mouth daily. Swallow whole.    [provider]  budesonide (PULMICORT) 0.5 MG/2ML nebulizer solution Take 2 mLs (0.5 mg total) by nebulization 2  (two) times daily as needed (cough, wheezing or shortness of breath). 12/31/22   Martina Sinner, MD  celecoxib (CELEBREX) 100 MG capsule Take 100 mg by mouth 2 (two) times daily. Patient not taking: Reported on 07/30/2023 06/07/23   [provider]  cetirizine (ZYRTEC) 10 MG tablet Take 10 mg by mouth at bedtime.    [provider]  Cholecalciferol (VITAMIN D3) 50 MCG (2000 UT) TABS Take 2,000 Units by mouth in the morning.    [provider]  cholestyramine light (PREVALITE) 4 g packet Take 4 g by mouth 2 (two) times daily.    [provider]  dicyclomine (BENTYL) 10 MG capsule Take 1 capsule (10 mg total) by mouth 4 (four) times daily -  before meals and at bedtime. Patient taking differently: Take 10 mg by mouth 4 (four) times daily -  before meals and at bedtime. cramping 01/25/23 04/25/23  Judyann Munson, MD  diphenoxylate-atropine (LOMOTIL) 2.5-0.025 MG tablet Take 1 tablet by mouth 2 (two) times daily. 05/19/23   Lynann Bologna, MD  fluticasone (FLONASE) 50 MCG/ACT nasal spray Place 2 sprays into both nostrils daily. 01/07/20   Joy, Shawn C, PA-C  furosemide (LASIX) 20 MG tablet Take 20 mg by mouth daily as needed (fluid retention (feet swelling)). Patient not taking: Reported on 07/30/2023 07/03/20   [provider]  hydrOXYzine (  ATARAX) 10 MG tablet TAKE ONE (1) TABLET BY MOUTH 3 TIMES DAILY AS NEEDED FOR ITCHING Patient not taking: Reported on 07/30/2023 03/03/23   [name], MD  hyoscyamine (LEVSIN SL) 0.125 MG SL tablet Place 1 tablet (0.125 mg total) under the tongue every 4 (four) hours as needed. Patient not taking: Reported on 07/30/2023 04/27/23   [name], MD  lidocaine-prilocaine (EMLA) cream Apply 1 Application topically as needed. 07/01/23   Josph Macho, MD  loperamide (IMODIUM) 2 MG capsule Take 4 mg by mouth in the morning and at bedtime.    [provider]  losartan (COZAAR) 50 MG tablet Take 50 mg by mouth in the  morning and at bedtime. 04/14/21   [provider]  Magnesium Glycinate 100 MG CAPS Take 200 mg by mouth at bedtime. 09/21/22   [provider]  meclizine (ANTIVERT) 12.5 MG tablet Take 12.5 mg by mouth 2 (two) times daily as needed for dizziness or nausea. Patient not taking: Reported on 07/30/2023 01/28/22   [provider]  metoprolol succinate (TOPROL-XL) 50 MG 24 hr tablet Take 1 tablet (50 mg total) by mouth 2 (two) times daily. Patient taking differently: Take 50 mg by mouth daily. 02/23/23   [name], MD  montelukast (SINGULAIR) 10 MG tablet Take 1 tablet (10 mg total) by mouth at bedtime. Please start taking this on September 4. 07/01/23   Josph Macho, MD  Multiple Vitamins-Minerals (CENTRUM SILVER WOMEN 50+ PO) Take 1 tablet by mouth daily at 6 (six) AM.    [provider]  neomycin-polymyxin-hydrocortisone (CORTISPORIN) OTIC solution Place 4 drops into both ears 4 (four) times daily. 07/30/23   Josph Macho, MD  nortriptyline (PAMELOR) 10 MG capsule Take 1 capsule (10 mg total) by mouth at bedtime. Patient not taking: Reported on 07/30/2023 06/14/23   [name], MD  ondansetron (ZOFRAN) 8 MG tablet Take 1 tablet (8 mg total) by mouth every 8 (eight) hours as needed for nausea or vomiting. 07/14/23   Josph Macho, MD  oxyCODONE-acetaminophen (PERCOCET/ROXICET) 5-325 MG tablet Take 1 tablet by mouth every 6 (six) hours as needed for severe pain. 11/07/21   [provider]  potassium chloride SA (KLOR-CON M) 20 MEQ tablet Take 1 tablet (20 mEq total) by mouth daily. 01/15/23   [name], MD  prochlorperazine (COMPAZINE) 10 MG tablet Take 0.5 tablets (5 mg total) by mouth 2 (two) times daily as needed for nausea or vomiting. Patient not taking: Reported on 07/30/2023 03/16/23   [name], MD  Saccharomyces boulardii (FLORASTOR PO) Take 500 mg by mouth daily at 6 (six) AM.    [provider]  scopolamine  (TRANSDERM-SCOP) 1 MG/3DAYS Place 1 patch (1.5 mg total) onto the skin every 3 (three) days. Patient not taking: Reported on 07/30/2023 06/14/23   [name], MD  scopolamine (TRANSDERM-SCOP) 1 MG/3DAYS Place 1 patch (1.5 mg total) onto the skin every 3 (three) days. Patient not taking: Reported on 07/30/2023 07/01/23   Josph Macho, MD  sertraline (ZOLOFT) 50 MG tablet Take 1 tablet (50 mg total) by mouth daily. Patient not taking: Reported on 07/30/2023 06/14/23   [name], MD  spironolactone (ALDACTONE) 25 MG tablet Take 25 mg by mouth daily.    [provider]      Allergies    Benazepril, Ondansetron hcl, Codeine, and Morphine    Review of Systems   Review of Systems  Musculoskeletal:        Right ankle pain  All other systems reviewed and are negative.   Physical Exam Updated Vital Signs BP (!) 154/95 (BP Location: Left Arm)   Pulse 88   Temp 98 F (36.7 C) (Oral)   Resp 18   Ht 5\' 6"  (1.676 m)   Wt 86 kg   SpO2 98%   BMI 30.60 kg/m  Physical Exam Vitals and nursing note reviewed.  Constitutional:      Comments: Uncomfortable   HENT:     Head: Normocephalic.     Nose: Nose normal.     Mouth/Throat:     Mouth: Mucous membranes are moist.  Eyes:     Extraocular Movements: Extraocular movements intact.     Pupils: Pupils are equal, round, and reactive to light.  Cardiovascular:     Rate and Rhythm: Normal rate.     Pulses: Normal pulses.  Pulmonary:     Effort: Pulmonary effort is normal.  Abdominal:     General: Abdomen is flat.  Musculoskeletal:     Cervical back: Normal range of motion and neck supple.     Comments: Right ankle swollen and tender.  No tib-fib tenderness or knee swelling or tenderness.  No base of fifth tenderness or foot tenderness or deformity  Neurological:     Mental Status: She is alert.  Psychiatric:        Mood and Affect: Mood normal.        Behavior: Behavior normal.     ED Results / Procedures / Treatments    Labs (all labs ordered are listed, but only abnormal results are displayed) Labs Reviewed - No data to display  EKG None  Radiology DG Ankle Complete Right  Result Date: 08/07/2023 CLINICAL DATA:  Pain, injury EXAM: RIGHT ANKLE - COMPLETE 3+ VIEW COMPARISON:  None Available. FINDINGS: Rotated frontal view limits assessment, particularly of the lateral malleolus. Within this limitation, there is fragmentation overlying the dorsal talus and navicular bones, concerning for avulsion fracture, best appreciated on lateral view. No evident fracture or dislocation of the tibia or fibula. Joint spaces are generally preserved. Diffuse soft tissue edema about the dorsum of the foot. IMPRESSION: 1. Rotated frontal view limits assessment, particularly of the lateral malleolus. Within this limitation, there is fragmentation overlying the dorsal talus and navicular bones, concerning for avulsion fracture, best appreciated on lateral view. 2. No evident fracture or dislocation of the tibia or fibula. 3. Diffuse soft tissue edema about the dorsum of the foot. Electronically Signed   By: Jearld Lesch M.D.   On: 08/07/2023 16:30    Procedures Procedures    Medications Ordered in ED Medications - No data to display  ED Course/ Medical Decision Making/ A&P                                 Medical Decision Making SHALEIGH LAUBSCHER is a 67 y.o. female here presenting with right ankle pain and swelling after injury.  Consider ankle sprain versus fracture.  Plan to get right ankle x-ray and reassess.  Patient drove here so cannot give any narcotics  5:39 PM X-ray showed possible navicular and dorsal talus fractures.  Plan to get CT ankle  8:04 PM CT showed avulsion fracture of distal talus adjacent to the navicular and also calcaneus and cuboid fractures.  Patient really wants to go home with cam walker.  Discussed  case with Dr. Hulda Humphrey from ortho.  He states that patient should be nonweightbearing and follow up  with Dr. Susa Simmonds from ortho.  Given a walker for now.  Unable to deliver a wheelchair until tomorrow.  I discussed with Steward Drone from case management who will review her insurance and try and deliver her wheelchair  tomorrow.   Problems Addressed: Closed nondisplaced fracture of cuboid of right foot, initial encounter: acute illness or injury Closed nondisplaced fracture of navicular bone of right foot, initial encounter: acute illness or injury Closed nondisplaced fracture of right calcaneus, unspecified portion of calcaneus, initial encounter: acute illness or injury  Amount and/or Complexity of Data Reviewed Radiology: ordered.  Risk Prescription drug management.    Final Clinical Impression(s) / ED Diagnoses Final diagnoses:  None    Rx / DC Orders ED Discharge Orders     None         Charlynne Pander, MD 08/07/23 2007

## 2023-08-08 NOTE — Care Management (Signed)
    Durable Medical Equipment  (From admission, onward)           Start     Ordered   08/07/23 0000  For home use only DME standard manual wheelchair with seat cushion       Comments: Patient suffers from ankle fracture which impairs their ability to perform daily activities like bathing, dressing, feeding, and grooming in the home.  A cane or crutch will not resolve issue with performing activities of daily living. A wheelchair will allow patient to safely perform daily activities. Patient can safely propel the wheelchair in the home or has a caregiver who can provide assistance. Length of need 6 months . Accessories: elevating leg rests (ELRs), wheel locks, extensions and anti-tippers.   08/07/23 1946

## 2023-08-08 NOTE — Care Management (Signed)
Consult noted, Messaged Tami [name] from [facility] for wheelchair and walker order.

## 2023-08-09 DIAGNOSIS — G4733 Obstructive sleep apnea (adult) (pediatric): Secondary | ICD-10-CM | POA: Diagnosis not present

## 2023-08-11 DIAGNOSIS — S93401A Sprain of unspecified ligament of right ankle, initial encounter: Secondary | ICD-10-CM | POA: Diagnosis not present

## 2023-08-12 ENCOUNTER — Emergency Department (HOSPITAL_BASED_OUTPATIENT_CLINIC_OR_DEPARTMENT_OTHER)
Admission: EM | Admit: 2023-08-12 | Discharge: 2023-08-12 | Disposition: A | Discharge status: Home / Self Care | Payer: 59 | Attending: Emergency Medicine | Admitting: Emergency Medicine

## 2023-08-12 ENCOUNTER — Encounter (HOSPITAL_BASED_OUTPATIENT_CLINIC_OR_DEPARTMENT_OTHER): Payer: Self-pay | Admitting: Emergency Medicine

## 2023-08-12 ENCOUNTER — Other Ambulatory Visit: Payer: Self-pay

## 2023-08-12 DIAGNOSIS — E860 Dehydration: Secondary | ICD-10-CM | POA: Diagnosis not present

## 2023-08-12 DIAGNOSIS — Z7982 Long term (current) use of aspirin: Secondary | ICD-10-CM | POA: Diagnosis not present

## 2023-08-12 DIAGNOSIS — R197 Diarrhea, unspecified: Secondary | ICD-10-CM | POA: Insufficient documentation

## 2023-08-12 LAB — COMPREHENSIVE METABOLIC PANEL
ALT: 23 U/L (ref 0–44)
AST: 26 U/L (ref 15–41)
Albumin: 3.3 g/dL — ABNORMAL LOW (ref 3.5–5.0)
Alkaline Phosphatase: 41 U/L (ref 38–126)
Anion gap: 14 (ref 5–15)
BUN: 18 mg/dL (ref 8–23)
CO2: 24 mmol/L (ref 22–32)
Calcium: 9 mg/dL (ref 8.9–10.3)
Chloride: 100 mmol/L (ref 98–111)
Creatinine, Ser: 0.7 mg/dL (ref 0.44–1.00)
GFR, Estimated: >60 mL/min (ref >60)
Glucose, Bld: 110 mg/dL — ABNORMAL HIGH (ref 70–99)
Potassium: 3.7 mmol/L (ref 3.5–5.1)
Sodium: 138 mmol/L (ref 135–145)
Total Bilirubin: 0.6 mg/dL (ref 0.3–1.2)
Total Protein: 8.2 g/dL — ABNORMAL HIGH (ref 6.5–8.1)

## 2023-08-12 LAB — CBC
HCT: 36.8 % (ref 36.0–46.0)
Hemoglobin: 11.7 g/dL — ABNORMAL LOW (ref 12.0–15.0)
MCH: 28.8 pg (ref 26.0–34.0)
MCHC: 31.8 g/dL (ref 30.0–36.0)
MCV: 90.6 fL (ref 80.0–100.0)
Platelets: 146 10*3/uL — ABNORMAL LOW (ref 150–400)
RBC: 4.06 MIL/uL (ref 3.87–5.11)
RDW: 14.5 % (ref 11.5–15.5)
WBC: 3.5 10*3/uL — ABNORMAL LOW (ref 4.0–10.5)
nRBC: 0 % (ref 0.0–0.2)

## 2023-08-12 LAB — LIPASE, BLOOD: Lipase: 30 U/L (ref 11–51)

## 2023-08-12 MED ORDER — SODIUM CHLORIDE 0.9 % IV BOLUS
1000.0000 mL | Freq: Once | INTRAVENOUS | Status: AC
  Administered 2023-08-12: 1000 mL via INTRAVENOUS

## 2023-08-12 MED ORDER — DIPHENHYDRAMINE HCL 50 MG/ML IJ SOLN: 25.0000 mg | Freq: Once | INTRAMUSCULAR | Status: DC

## 2023-08-12 MED ORDER — METOCLOPRAMIDE HCL 5 MG/ML IJ SOLN: 10.0000 mg | Freq: Once | INTRAMUSCULAR | Status: DC

## 2023-08-12 NOTE — ED Triage Notes (Signed)
Pt to ER with c/o ongoing diarrhea, states she feels weak and like she is dehydrated.  Pt states diarrhea since Sunday, denies abdominal pain, n/v.

## 2023-08-12 NOTE — ED Provider Notes (Signed)
[place] [facility] Provider Note   CSN: [id number] Arrival date & time: 08/12/23  1440     History  Chief Complaint  Patient presents with   Weakness   Diarrhea    Tami [name] is a 67 y.o. female history of myeloma, recent right ankle fracture here presenting with diarrhea.  Patient has chronic diarrhea.  She just came home from a cruise.  She had a right ankle fracture about a week ago.  Patient states that she has worsening diarrhea and nausea.  She states that she takes nausea medicine at baseline.  She states that she often needs some IV fluids.  Denies any abdominal pain.  The history is provided by the patient.       Home Medications Prior to Admission medications   Medication Sig Start Date End Date Taking? Authorizing Provider  acetaminophen (TYLENOL) 500 MG tablet Take 500 mg by mouth every 6 (six) hours as needed.    [provider]  acyclovir (ZOVIRAX) 400 MG tablet Take 1 tablet (400 mg total) by mouth 2 (two) times daily. 04/22/23   Josph Macho, MD  albuterol (PROVENTIL) (2.5 MG/3ML) 0.083% nebulizer solution Take 3 mLs (2.5 mg total) by nebulization every 6 (six) hours as needed for wheezing or shortness of breath. 12/15/22   [name], MD  albuterol (VENTOLIN HFA) 108 (90 Base) MCG/ACT inhaler Inhale 2 puffs into the lungs every 6 (six) hours as needed for wheezing or shortness of breath. 12/07/22   [name], MD  ALPRAZolam Prudy Feeler) 0.25 MG tablet Take 1-2 tablets (0.25-0.5 mg total) by mouth 3 (three) times daily as needed for anxiety. Patient not taking: Reported on 07/30/2023 06/14/23   [name], MD  aspirin EC 81 MG tablet Take 81 mg by mouth daily. Swallow whole.    [provider]  budesonide (PULMICORT) 0.5 MG/2ML nebulizer solution Take 2 mLs (0.5 mg total) by nebulization 2 (two) times daily as needed (cough, wheezing or shortness of breath). 12/31/22   Martina Sinner, MD   celecoxib (CELEBREX) 100 MG capsule Take 100 mg by mouth 2 (two) times daily. Patient not taking: Reported on 07/30/2023 06/07/23   [provider]  cetirizine (ZYRTEC) 10 MG tablet Take 10 mg by mouth at bedtime.    [provider]  Cholecalciferol (VITAMIN D3) 50 MCG (2000 UT) TABS Take 2,000 Units by mouth in the morning.    [provider]  cholestyramine light (PREVALITE) 4 g packet Take 4 g by mouth 2 (two) times daily.    [provider]  dicyclomine (BENTYL) 10 MG capsule Take 1 capsule (10 mg total) by mouth 4 (four) times daily -  before meals and at bedtime. Patient taking differently: Take 10 mg by mouth 4 (four) times daily -  before meals and at bedtime. cramping 01/25/23 04/25/23  Judyann Munson, MD  diphenoxylate-atropine (LOMOTIL) 2.5-0.025 MG tablet Take 1 tablet by mouth 2 (two) times daily. 05/19/23   Lynann Bologna, MD  fluticasone (FLONASE) 50 MCG/ACT nasal spray Place 2 sprays into both nostrils daily. 01/07/20   Joy, Shawn C, PA-C  furosemide (LASIX) 20 MG tablet Take 20 mg by mouth daily as needed (fluid retention (feet swelling)). Patient not taking: Reported on 07/30/2023 07/03/20   [provider]  hydrOXYzine (ATARAX) 10 MG tablet TAKE ONE (1) TABLET BY MOUTH 3 TIMES DAILY AS NEEDED FOR ITCHING Patient not taking: Reported on 07/30/2023 03/03/23  [name], MD  hyoscyamine (LEVSIN SL) 0.125 MG SL tablet Place 1 tablet (0.125 mg total) under the tongue every 4 (four) hours as needed. Patient not taking: Reported on 07/30/2023 04/27/23   [name], MD  lidocaine-prilocaine (EMLA) cream Apply 1 Application topically as needed. 07/01/23   Josph Macho, MD  loperamide (IMODIUM) 2 MG capsule Take 4 mg by mouth in the morning and at bedtime.    [provider]  losartan (COZAAR) 50 MG tablet Take 50 mg by mouth in the morning and at bedtime. 04/14/21   [provider]  Magnesium Glycinate 100 MG CAPS Take 200  mg by mouth at bedtime. 09/21/22   [provider]  meclizine (ANTIVERT) 12.5 MG tablet Take 12.5 mg by mouth 2 (two) times daily as needed for dizziness or nausea. Patient not taking: Reported on 07/30/2023 01/28/22   [provider]  metoprolol succinate (TOPROL-XL) 50 MG 24 hr tablet Take 1 tablet (50 mg total) by mouth 2 (two) times daily. Patient taking differently: Take 50 mg by mouth daily. 02/23/23   [name], MD  montelukast (SINGULAIR) 10 MG tablet Take 1 tablet (10 mg total) by mouth at bedtime. Please start taking this on September 4. 07/01/23   Josph Macho, MD  Multiple Vitamins-Minerals (CENTRUM SILVER WOMEN 50+ PO) Take 1 tablet by mouth daily at 6 (six) AM.    [provider]  neomycin-polymyxin-hydrocortisone (CORTISPORIN) OTIC solution Place 4 drops into both ears 4 (four) times daily. 07/30/23   Josph Macho, MD  nortriptyline (PAMELOR) 10 MG capsule Take 1 capsule (10 mg total) by mouth at bedtime. Patient not taking: Reported on 07/30/2023 06/14/23   [name], MD  ondansetron (ZOFRAN) 8 MG tablet Take 1 tablet (8 mg total) by mouth every 8 (eight) hours as needed for nausea or vomiting. 07/14/23   Josph Macho, MD  oxyCODONE-acetaminophen (PERCOCET) 5-325 MG tablet Take 2 tablets by mouth every 6 (six) hours as needed. 08/07/23   Charlynne Pander, MD  oxyCODONE-acetaminophen (PERCOCET/ROXICET) 5-325 MG tablet Take 1 tablet by mouth every 6 (six) hours as needed for severe pain. 11/07/21   [provider]  potassium chloride SA (KLOR-CON M) 20 MEQ tablet Take 1 tablet (20 mEq total) by mouth daily. 01/15/23   [name], MD  prochlorperazine (COMPAZINE) 10 MG tablet Take 0.5 tablets (5 mg total) by mouth 2 (two) times daily as needed for nausea or vomiting. Patient not taking: Reported on 07/30/2023 03/16/23   [name], MD  Saccharomyces boulardii (FLORASTOR PO) Take 500 mg by mouth daily at 6 (six) AM.    [provider]  scopolamine (TRANSDERM-SCOP) 1 MG/3DAYS Place 1 patch (1.5 mg total) onto the skin every 3 (three) days. Patient not taking: Reported on 07/30/2023 06/14/23   [name], MD  scopolamine (TRANSDERM-SCOP) 1 MG/3DAYS Place 1 patch (1.5 mg total) onto the skin every 3 (three) days. Patient not taking: Reported on 07/30/2023 07/01/23   Josph Macho, MD  sertraline (ZOLOFT) 50 MG tablet Take 1 tablet (50 mg total) by mouth daily. Patient not taking: Reported on 07/30/2023 06/14/23   [name], MD  spironolactone (ALDACTONE) 25 MG tablet Take 25 mg by mouth daily.    [provider]      Allergies    Benazepril, Ondansetron hcl, Codeine, and Morphine    Review of Systems   Review of Systems  Neurological:  Positive for weakness.  All other systems reviewed and are negative.   Physical Exam Updated Vital Signs BP (!) 173/100   Pulse 75   Temp 98.1 F (36.7 C) (Oral)   Resp 18   Ht 5\' 6"  (1.676 m)   Wt 87.1 kg   SpO2 97%   BMI 30.99 kg/m  Physical Exam Vitals and nursing note reviewed.  Constitutional:      Comments: Study dehydrated  HENT:     Head: Normocephalic.     Nose: Nose normal.     Mouth/Throat:     Mouth: Mucous membranes are dry.  Eyes:     Extraocular Movements: Extraocular movements intact.     Pupils: Pupils are equal, round, and reactive to light.  Cardiovascular:     Rate and Rhythm: Normal rate and regular rhythm.     Pulses: Normal pulses.     Heart sounds: Normal heart sounds.  Pulmonary:     Effort: Pulmonary effort is normal.     Breath sounds: Normal breath sounds.  Abdominal:     General: Abdomen is flat.     Palpations: Abdomen is soft.  Musculoskeletal:        General: Normal range of motion.     Cervical back: Normal range of motion and neck supple.  Skin:    General: Skin is warm.     Capillary Refill: Capillary refill takes less than 2 seconds.  Neurological:     General: No focal deficit present.      Mental Status: She is oriented to person, place, and time.  Psychiatric:        Mood and Affect: Mood normal.        Behavior: Behavior normal.     ED Results / Procedures / Treatments   Labs (all labs ordered are listed, but only abnormal results are displayed) Labs Reviewed  COMPREHENSIVE METABOLIC PANEL - Abnormal; Notable for the following components:      Result Value   Glucose, Bld 110 (*)    Total Protein 8.2 (*)    Albumin 3.3 (*)    All other components within normal limits  CBC - Abnormal; Notable for the following components:   WBC 3.5 (*)    Hemoglobin 11.7 (*)    Platelets 146 (*)    All other components within normal limits  LIPASE, BLOOD  URINALYSIS, ROUTINE W REFLEX MICROSCOPIC    EKG None  Radiology No results found.  Procedures Procedures    Medications Ordered in ED Medications  sodium chloride 0.9 % bolus 1,000 mL (1,000 mLs Intravenous New Bag/Given 08/12/23 2025)    ED Course/ Medical Decision Making/ A&P                                 Medical Decision Making VALARIE FARACE is a 67 y.o. female here presenting with diarrhea and mild dehydration.  She states that this is a chronic problem.  She states that she has myeloma and often gets IV fluids.  Will check kidney function.  Will hydrate patient and reassess.  9:11 PM I reviewed patient's labs and they were unremarkable.  Given IV fluids and she felt better.  She has follow-up with her doctor tomorrow.  Stable for discharge  Problems Addressed: Dehydration: acute illness or injury  Amount and/or Complexity of Data Reviewed Labs: ordered. Decision-making details documented in ED Course.    Final Clinical Impression(s) /  ED Diagnoses Final diagnoses:  None    Rx / DC Orders ED Discharge Orders     None         Charlynne Pander, MD 08/12/23 2113

## 2023-08-12 NOTE — Discharge Instructions (Signed)
Please stay hydrated  See your doctor tomorrow  Return to ER if you have severe abdominal pain or vomiting or fever

## 2023-08-13 ENCOUNTER — Inpatient Hospital Stay: Payer: 59

## 2023-08-13 ENCOUNTER — Encounter: Payer: Self-pay | Admitting: Hematology & Oncology

## 2023-08-13 ENCOUNTER — Other Ambulatory Visit: Payer: Self-pay

## 2023-08-13 ENCOUNTER — Inpatient Hospital Stay: Payer: 59 | Attending: Hematology & Oncology

## 2023-08-13 ENCOUNTER — Inpatient Hospital Stay (HOSPITAL_BASED_OUTPATIENT_CLINIC_OR_DEPARTMENT_OTHER): Payer: 59 | Admitting: Hematology & Oncology

## 2023-08-13 VITALS — BP 161/83

## 2023-08-13 VITALS — BP 158/78 | HR 79 | Temp 98.2°F | Resp 19 | Ht 66.0 in | Wt 193.0 lb

## 2023-08-13 DIAGNOSIS — C9000 Multiple myeloma not having achieved remission: Secondary | ICD-10-CM

## 2023-08-13 DIAGNOSIS — Z5112 Encounter for antineoplastic immunotherapy: Secondary | ICD-10-CM | POA: Diagnosis not present

## 2023-08-13 DIAGNOSIS — C9002 Multiple myeloma in relapse: Secondary | ICD-10-CM

## 2023-08-13 DIAGNOSIS — E860 Dehydration: Secondary | ICD-10-CM

## 2023-08-13 LAB — CMP (CANCER CENTER ONLY)
ALT: 18 U/L (ref 0–44)
AST: 21 U/L (ref 15–41)
Albumin: 3.4 g/dL — ABNORMAL LOW (ref 3.5–5.0)
Alkaline Phosphatase: 41 U/L (ref 38–126)
Anion gap: 7 (ref 5–15)
BUN: 14 mg/dL (ref 8–23)
CO2: 27 mmol/L (ref 22–32)
Calcium: 8.7 mg/dL — ABNORMAL LOW (ref 8.9–10.3)
Chloride: 105 mmol/L (ref 98–111)
Creatinine: 0.63 mg/dL (ref 0.44–1.00)
GFR, Estimated: >60 mL/min (ref >60)
Glucose, Bld: 123 mg/dL — ABNORMAL HIGH (ref 70–99)
Potassium: 3.6 mmol/L (ref 3.5–5.1)
Sodium: 139 mmol/L (ref 135–145)
Total Bilirubin: 0.4 mg/dL (ref 0.3–1.2)
Total Protein: 7.9 g/dL (ref 6.5–8.1)

## 2023-08-13 LAB — CBC WITH DIFFERENTIAL (CANCER CENTER ONLY)
Abs Immature Granulocytes: 0.01 10*3/uL (ref 0.00–0.07)
Basophils Absolute: 0 10*3/uL (ref 0.0–0.1)
Basophils Relative: 1 %
Eosinophils Absolute: 0 10*3/uL (ref 0.0–0.5)
Eosinophils Relative: 1 %
HCT: 34.2 % — ABNORMAL LOW (ref 36.0–46.0)
Hemoglobin: 11 g/dL — ABNORMAL LOW (ref 12.0–15.0)
Immature Granulocytes: 0 %
Lymphocytes Relative: 22 %
Lymphs Abs: 0.7 10*3/uL (ref 0.7–4.0)
MCH: 29.3 pg (ref 26.0–34.0)
MCHC: 32.2 g/dL (ref 30.0–36.0)
MCV: 91.2 fL (ref 80.0–100.0)
Monocytes Absolute: 0.3 10*3/uL (ref 0.1–1.0)
Monocytes Relative: 9 %
Neutro Abs: 2.2 10*3/uL (ref 1.7–7.7)
Neutrophils Relative %: 67 %
Platelet Count: 122 10*3/uL — ABNORMAL LOW (ref 150–400)
RBC: 3.75 MIL/uL — ABNORMAL LOW (ref 3.87–5.11)
RDW: 14.4 % (ref 11.5–15.5)
WBC Count: 3.3 10*3/uL — ABNORMAL LOW (ref 4.0–10.5)
nRBC: 0 % (ref 0.0–0.2)

## 2023-08-13 LAB — LACTATE DEHYDROGENASE: LDH: 304 U/L — ABNORMAL HIGH (ref 98–192)

## 2023-08-13 LAB — FERRITIN: Ferritin: 309 ng/mL — ABNORMAL HIGH (ref 11–307)

## 2023-08-13 LAB — IRON AND IRON BINDING CAPACITY (CC-WL,HP ONLY)
Iron: 51 ug/dL (ref 28–170)
Saturation Ratios: 16 % (ref 10.4–31.8)
TIBC: 329 ug/dL (ref 250–450)
UIBC: 278 ug/dL (ref 148–442)

## 2023-08-13 MED ORDER — SODIUM CHLORIDE 0.9 % IV SOLN: INTRAVENOUS | Status: DC

## 2023-08-13 MED ORDER — FAMOTIDINE IN NACL 20-0.9 MG/50ML-% IV SOLN
20.0000 mg | Freq: Once | INTRAVENOUS | Status: AC
  Administered 2023-08-13: 20 mg via INTRAVENOUS
  Filled 2023-08-13: qty 50

## 2023-08-13 MED ORDER — SODIUM CHLORIDE 0.9 % IV SOLN
20.0000 mg | Freq: Once | INTRAVENOUS | Status: AC
  Administered 2023-08-13: 20 mg via INTRAVENOUS
  Filled 2023-08-13: qty 20

## 2023-08-13 MED ORDER — DIPHENHYDRAMINE HCL 50 MG/ML IJ SOLN
50.0000 mg | Freq: Once | INTRAMUSCULAR | Status: AC
  Administered 2023-08-13: 50 mg via INTRAVENOUS
  Filled 2023-08-13: qty 1

## 2023-08-13 MED ORDER — SODIUM CHLORIDE 0.9 % IV SOLN: Freq: Once | INTRAVENOUS | Status: DC

## 2023-08-13 MED ORDER — HEPARIN SOD (PORK) LOCK FLUSH 100 UNIT/ML IV SOLN
500.0000 [IU] | Freq: Once | INTRAVENOUS | Status: AC | PRN
  Administered 2023-08-13: 500 [IU]

## 2023-08-13 MED ORDER — ACETAMINOPHEN 325 MG PO TABS
650.0000 mg | ORAL_TABLET | Freq: Once | ORAL | Status: DC
  Filled 2023-08-13: qty 2

## 2023-08-13 MED ORDER — SODIUM CHLORIDE 0.9% FLUSH
10.0000 mL | INTRAVENOUS | Status: DC | PRN
  Administered 2023-08-13: 10 mL

## 2023-08-13 MED ORDER — SODIUM CHLORIDE 0.9 % IV SOLN
300.0000 mg/m2 | Freq: Once | INTRAVENOUS | Status: AC
  Administered 2023-08-13: 600 mg via INTRAVENOUS
  Filled 2023-08-13: qty 30

## 2023-08-13 MED ORDER — SODIUM CHLORIDE 0.9 % IV SOLN
10.0000 mg/kg | Freq: Once | INTRAVENOUS | Status: AC
  Administered 2023-08-13: 900 mg via INTRAVENOUS
  Filled 2023-08-13: qty 25

## 2023-08-13 MED ORDER — DEXTROSE 5 % IV SOLN
56.0000 mg/m2 | Freq: Once | INTRAVENOUS | Status: AC
  Administered 2023-08-13: 110 mg via INTRAVENOUS
  Filled 2023-08-13: qty 30

## 2023-08-13 NOTE — Progress Notes (Signed)
Pt. arrived, she returned from cruise, had diarrhea and was in ED yesterday. Had IVF, feeling better. IVF hydration started in infusion prior to chemo.  Addon MD appt. Lab stable and ok to treat today per md prior to MD visit.  Additional lab added per Dr. Bea Laura today. Pt. also stated that she has occasional dizziness.  Improved on d/c. HL

## 2023-08-13 NOTE — Patient Instructions (Signed)
[facility]  Discharge Instructions: Thank you for choosing [facility] to provide your oncology and hematology care.   If you have a lab appointment with the Cancer Center, please go directly to the Cancer Center and check in at the registration area.  Wear comfortable clothing and clothing appropriate for easy access to any Portacath or PICC line.   We strive to give you quality time with your provider. You may need to reschedule your appointment if you arrive late (15 or more minutes).  Arriving late affects you and other patients whose appointments are after yours.  Also, if you miss three or more appointments without notifying the office, you may be dismissed from the clinic at the provider's discretion.      For prescription refill requests, have your pharmacy contact our office and allow 72 hours for refills to be completed.    Today you received the following chemotherapy and/or immunotherapy agents Cytoxan/Carfilzomib/Isatuximab      To help prevent nausea and vomiting after your treatment, we encourage you to take your nausea medication as directed.  BELOW ARE SYMPTOMS THAT SHOULD BE REPORTED IMMEDIATELY: *FEVER GREATER THAN 100.4 F (38 C) OR HIGHER *CHILLS OR SWEATING *NAUSEA AND VOMITING THAT IS NOT CONTROLLED WITH YOUR NAUSEA MEDICATION *UNUSUAL SHORTNESS OF BREATH *UNUSUAL BRUISING OR BLEEDING *URINARY PROBLEMS (pain or burning when urinating, or frequent urination) *BOWEL PROBLEMS (unusual diarrhea, constipation, pain near the anus) TENDERNESS IN MOUTH AND THROAT WITH OR WITHOUT PRESENCE OF ULCERS (sore throat, sores in mouth, or a toothache) UNUSUAL RASH, SWELLING OR PAIN  UNUSUAL VAGINAL DISCHARGE OR ITCHING   Items with * indicate a potential emergency and should be followed up as soon as possible or go to the Emergency Department if any problems should occur.  Please show the CHEMOTHERAPY ALERT CARD or IMMUNOTHERAPY  ALERT CARD at check-in to the Emergency Department and triage nurse. Should you have questions after your visit or need to cancel or reschedule your appointment, please contact [facility] [place]  [phone] and follow the prompts.  Office hours are 8:00 a.m. to 4:30 p.m. Monday - Friday. Please note that voicemails left after 4:00 p.m. may not be returned until the following business day.  We are closed weekends and major holidays. You have access to a nurse at all times for urgent questions. Please call the main number to the clinic [phone] and follow the prompts.  For any non-urgent questions, you may also contact your provider using MyChart. We now offer e-Visits for anyone 75 and older to request care online for non-urgent symptoms. For details visit [email or web address].   Also download the MyChart app! Go to the app store, search "MyChart", open the app, select [place], and log in with your MyChart username and password.

## 2023-08-13 NOTE — Progress Notes (Signed)
Hematology and Oncology Follow Up Visit  Tami [name] [id number] 02-Jun-1956 67 y.o. 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  She really did well with her first cycle of chemotherapy.  She is aware had a nice response.  More importantly, the fact that she had a wonderful cruise.  I saw pictures from her cruise.  She even brought back a coffee mug from the [place].  She did have a little bit of diarrhea yesterday she said.  She went to the ER.  I told her that she would be hold easier to come up to our office.  It would take somewhat less time for her.  When we last checked her monoclonal studies, the M spike was down to 2.3 g/dL.  Her IgG level was down to the 3350 mg/dL.  The Kappa light chain was down to 2.9 mg/dL.  Her appetite has been okay.  She has had no nausea or vomiting.  Her heart has been doing okay.  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  She has had no rashes.  She has had no leg swelling.  She has had no bleeding.  There is been no mouth sores.  Overall, I would say performance status is probably ECOG 1.     Medications:  Allergies as of 08/13/2023       Reactions   Benazepril Anaphylaxis, Swelling, Hives   angioedema Throat and lip swelling   Ondansetron Hcl Hives   Redness and hives post IV admin on 07/05/17   Codeine Nausea And Vomiting   Morphine Hives   Redness and hives noted post IV admin on 07/05/17        Medication List        Accurate as of August 13, 2023 10:47 AM.  If you have any questions, ask your nurse or doctor.          acetaminophen 500 MG tablet Commonly known as: TYLENOL Take 500 mg by mouth every 6 (six) hours as needed.   acyclovir 400 MG tablet Commonly known as: ZOVIRAX Take 1 tablet (400 mg total) by mouth 2 (two) times daily.   albuterol 108 (90 Base) MCG/ACT inhaler Commonly known as: VENTOLIN HFA Inhale 2 puffs into the lungs every 6 (six) hours as needed for wheezing or shortness of breath.   albuterol (2.5 MG/3ML) 0.083% nebulizer solution Commonly known as: PROVENTIL Take 3 mLs (2.5 mg total) by nebulization every 6 (six) hours as needed for wheezing or shortness of breath.   ALPRAZolam 0.25 MG tablet Commonly known as: XANAX Take 1-2 tablets (0.25-0.5 mg total) by mouth 3 (three) times daily as needed for anxiety.   aspirin EC 81 MG tablet Take 81 mg by mouth daily. Swallow whole.   budesonide 0.5 MG/2ML nebulizer solution Commonly known as: Pulmicort Take 2 mLs (0.5 mg total) by nebulization 2 (two) times daily as needed (cough, wheezing or shortness of breath).   celecoxib 100 MG capsule Commonly known as: CELEBREX Take 100  mg by mouth 2 (two) times daily.   CENTRUM SILVER WOMEN 50+ PO Take 1 tablet by mouth daily at 6 (six) AM.   cetirizine 10 MG tablet Commonly known as: ZYRTEC Take 10 mg by mouth at bedtime.   cholestyramine light 4 g packet Commonly known as: PREVALITE Take 4 g by mouth 2 (two) times daily.   dicyclomine 10 MG capsule Commonly known as: BENTYL Take 1 capsule (10 mg total) by mouth 4 (four) times daily -  before meals and at bedtime. What changed: additional instructions   diphenoxylate-atropine 2.5-0.025 MG tablet Commonly known as: Lomotil Take 1 tablet by mouth 2 (two) times daily.   FLORASTOR PO Take 500 mg by mouth daily at 6 (six) AM.   fluticasone 50 MCG/ACT nasal spray Commonly known as: FLONASE Place 2 sprays into both nostrils daily.   furosemide 20 MG  tablet Commonly known as: LASIX Take 20 mg by mouth daily as needed (fluid retention (feet swelling)).   hydrOXYzine 10 MG tablet Commonly known as: ATARAX TAKE ONE (1) TABLET BY MOUTH 3 TIMES DAILY AS NEEDED FOR ITCHING   hyoscyamine 0.125 MG SL tablet Commonly known as: LEVSIN SL Place 1 tablet (0.125 mg total) under the tongue every 4 (four) hours as needed.   lidocaine-prilocaine cream Commonly known as: EMLA Apply 1 Application topically as needed.   loperamide 2 MG capsule Commonly known as: IMODIUM Take 4 mg by mouth in the morning and at bedtime.   losartan 50 MG tablet Commonly known as: COZAAR Take 50 mg by mouth in the morning and at bedtime.   Magnesium Glycinate 100 MG Caps Take 200 mg by mouth at bedtime.   meclizine 12.5 MG tablet Commonly known as: ANTIVERT Take 12.5 mg by mouth 2 (two) times daily as needed for dizziness or nausea.   metoprolol succinate 50 MG 24 hr tablet Commonly known as: TOPROL-XL Take 1 tablet (50 mg total) by mouth 2 (two) times daily. What changed: when to take this   montelukast 10 MG tablet Commonly known as: Singulair Take 1 tablet (10 mg total) by mouth at bedtime. Please start taking this on September 4.   neomycin-polymyxin-hydrocortisone OTIC solution Commonly known as: CORTISPORIN Place 4 drops into both ears 4 (four) times daily.   nortriptyline 10 MG capsule Commonly known as: Pamelor Take 1 capsule (10 mg total) by mouth at bedtime.   ondansetron 8 MG tablet Commonly known as: Zofran Take 1 tablet (8 mg total) by mouth every 8 (eight) hours as needed for nausea or vomiting.   oxyCODONE-acetaminophen 5-325 MG tablet Commonly known as: PERCOCET/ROXICET Take 1 tablet by mouth every 6 (six) hours as needed for severe pain.   oxyCODONE-acetaminophen 5-325 MG tablet Commonly known as: Percocet Take 2 tablets by mouth every 6 (six) hours as needed.   potassium chloride SA 20 MEQ tablet Commonly known as:  KLOR-CON M Take 1 tablet (20 mEq total) by mouth daily.   prochlorperazine 10 MG tablet Commonly known as: COMPAZINE Take 0.5 tablets (5 mg total) by mouth 2 (two) times daily as needed for nausea or vomiting.   scopolamine 1 MG/3DAYS Commonly known as: TRANSDERM-SCOP Place 1 patch (1.5 mg total) onto the skin every 3 (three) days.   scopolamine 1 MG/3DAYS Commonly known as: TRANSDERM-SCOP Place 1 patch (1.5 mg total) onto the skin every 3 (three) days.   sertraline 50 MG tablet Commonly known as: ZOLOFT Take 1 tablet (50 mg total) by mouth daily.   spironolactone  25 MG tablet Commonly known as: ALDACTONE Take 25 mg by mouth daily.   Vitamin D3 50 MCG (2000 UT) Tabs Take 2,000 Units by mouth in the morning.        Allergies:  Allergies  Allergen Reactions   Benazepril Anaphylaxis, Swelling and Hives    angioedema Throat and lip swelling   Ondansetron Hcl Hives    Redness and hives post IV admin on 07/05/17   Codeine Nausea And Vomiting   Morphine Hives    Redness and hives noted post IV admin on 07/05/17    Past Medical History, Surgical history, Social history, and Family History were reviewed and updated.  Review of Systems: Review of Systems  Constitutional: Negative.   HENT: Negative.    Eyes: Negative.   Respiratory: Negative.    Cardiovascular: Negative.   Gastrointestinal: Negative.   Genitourinary: Negative.   Musculoskeletal: Negative.   Skin: Negative.   Neurological: Negative.   Endo/Heme/Allergies: Negative.   Psychiatric/Behavioral: Negative.        Physical Exam: Temperature is 98.2.  Pulse 79.  Blood pressure 149/78.  Weight is 193 pounds.     Wt Readings from Last 3 Encounters:  08/13/23 193 lb (87.5 kg)  08/12/23 192 lb (87.1 kg)  08/07/23 189 lb 9.5 oz (86 kg)    Physical Exam Vitals reviewed.  HENT:     Head: Normocephalic and atraumatic.  Eyes:     Pupils: Pupils are equal, round, and reactive to light.     Comments:  Mild pallor of conjunctiva  Cardiovascular:     Rate and Rhythm: Normal rate and regular rhythm.     Heart sounds: Normal heart sounds.  Pulmonary:     Effort: Pulmonary effort is normal.     Breath sounds: Normal breath sounds.  Abdominal:     General: Bowel sounds are normal.     Palpations: Abdomen is soft.  Musculoskeletal:        General: No tenderness or deformity. Normal range of motion.     Cervical back: Normal range of motion.  Lymphadenopathy:     Cervical: No cervical adenopathy.  Skin:    General: Skin is warm and dry.     Findings: No erythema or rash.  Neurological:     Mental Status: She is alert and oriented to person, place, and time.  Psychiatric:        Behavior: Behavior normal.        Thought Content: Thought content normal.        Judgment: Judgment normal.      Lab Results  Component Value Date   WBC 3.3 (L) 08/13/2023   HGB 11.0 (L) 08/13/2023   HCT 34.2 (L) 08/13/2023   MCV 91.2 08/13/2023   PLT 122 (L) 08/13/2023   Lab Results  Component Value Date   FERRITIN 288 06/24/2023   IRON 86 06/24/2023   TIBC 335 06/24/2023   UIBC 249 06/24/2023   IRONPCTSAT 26 06/24/2023   Lab Results  Component Value Date   RETICCTPCT 1.2 11/27/2022   RBC 3.75 (L) 08/13/2023   RETICCTABS 32,640 07/16/2016   Lab Results  Component Value Date   KPAFRELGTCHN 29.1 (H) 07/30/2023   LAMBDASER 2.7 (L) 07/30/2023   KAPLAMBRATIO 10.78 (H) 07/30/2023   Lab Results  Component Value Date   IGGSERUM 3,261 (H) 07/30/2023   IGGSERUM 3,541 (H) 07/30/2023   IGA 103 07/30/2023   IGA 107 07/30/2023   IGMSERUM 91 07/30/2023   IGMSERUM 97 07/30/2023  Lab Results  Component Value Date   TOTALPROTELP 8.1 07/30/2023   ALBUMINELP 3.2 07/30/2023   A1GS 0.2 07/30/2023   A2GS 0.9 07/30/2023   BETS 1.1 07/30/2023   BETA2SER 0.5 07/16/2016   GAMS 2.6 (H) 07/30/2023   MSPIKE 2.3 (H) 07/30/2023   SPEI Comment 10/27/2022     Chemistry      Component Value Date/Time    NA 139 08/13/2023 0800   NA 143 11/08/2017 1127   NA 140 12/31/2016 1000   K 3.6 08/13/2023 0800   K 4.0 11/08/2017 1127   K 3.6 12/31/2016 1000   CL 105 08/13/2023 0800   CL 107 11/08/2017 1127   CO2 27 08/13/2023 0800   CO2 25 11/08/2017 1127   CO2 24 12/31/2016 1000   BUN 14 08/13/2023 0800   BUN 19 11/08/2017 1127   BUN 22.3 12/31/2016 1000   CREATININE 0.63 08/13/2023 0800   CREATININE 0.9 11/08/2017 1127   CREATININE 0.8 12/31/2016 1000      Component Value Date/Time   CALCIUM 8.7 (L) 08/13/2023 0800   CALCIUM 9.9 11/08/2017 1127   CALCIUM 9.5 12/31/2016 1000   ALKPHOS 41 08/13/2023 0800   ALKPHOS 44 11/08/2017 1127   ALKPHOS 51 12/31/2016 1000   AST 21 08/13/2023 0800   AST 28 12/31/2016 1000   ALT 18 08/13/2023 0800   ALT 36 11/08/2017 1127   ALT 16 12/31/2016 1000   BILITOT 0.4 08/13/2023 0800   BILITOT 0.46 12/31/2016 1000       Impression and Plan: Ms. Horky is a very pleasant 67 yo African American female with a previous IgG kappa MGUS with progression to myeloma.  She has at 1 cycle of chemotherapy.  Will be very interesting to see what her labs look like now.  We will go ahead and start her second cycle of treatment.  I am just so happy that she had a wonderful time on her cruise.  I know that she will look forward to going on more cruises.  As always, we will plan to get her back for the start of her third cycle of treatment.  I think this will be an 1 month.     Josph Macho, MD 10/4/202410:47 AM

## 2023-08-15 LAB — IGG, IGA, IGM
IgA: 67 mg/dL — ABNORMAL LOW (ref 87–352)
IgG (Immunoglobin G), Serum: 2527 mg/dL — ABNORMAL HIGH (ref 586–1602)
IgM (Immunoglobulin M), Srm: 67 mg/dL (ref 26–217)

## 2023-08-16 ENCOUNTER — Telehealth: Payer: Self-pay | Admitting: Family Medicine

## 2023-08-16 ENCOUNTER — Encounter: Payer: Self-pay | Admitting: Family Medicine

## 2023-08-16 DIAGNOSIS — G629 Polyneuropathy, unspecified: Secondary | ICD-10-CM | POA: Diagnosis not present

## 2023-08-16 DIAGNOSIS — R112 Nausea with vomiting, unspecified: Secondary | ICD-10-CM | POA: Diagnosis not present

## 2023-08-16 DIAGNOSIS — I110 Hypertensive heart disease with heart failure: Secondary | ICD-10-CM | POA: Diagnosis not present

## 2023-08-16 DIAGNOSIS — I5022 Chronic systolic (congestive) heart failure: Secondary | ICD-10-CM | POA: Diagnosis not present

## 2023-08-16 DIAGNOSIS — N281 Cyst of kidney, acquired: Secondary | ICD-10-CM | POA: Diagnosis not present

## 2023-08-16 DIAGNOSIS — R109 Unspecified abdominal pain: Secondary | ICD-10-CM | POA: Diagnosis not present

## 2023-08-16 DIAGNOSIS — R197 Diarrhea, unspecified: Secondary | ICD-10-CM | POA: Diagnosis not present

## 2023-08-16 DIAGNOSIS — G4733 Obstructive sleep apnea (adult) (pediatric): Secondary | ICD-10-CM | POA: Diagnosis not present

## 2023-08-16 DIAGNOSIS — Z7982 Long term (current) use of aspirin: Secondary | ICD-10-CM | POA: Diagnosis not present

## 2023-08-16 DIAGNOSIS — R079 Chest pain, unspecified: Secondary | ICD-10-CM | POA: Diagnosis not present

## 2023-08-16 DIAGNOSIS — E860 Dehydration: Secondary | ICD-10-CM | POA: Diagnosis not present

## 2023-08-16 DIAGNOSIS — T50905A Adverse effect of unspecified drugs, medicaments and biological substances, initial encounter: Secondary | ICD-10-CM | POA: Diagnosis not present

## 2023-08-16 DIAGNOSIS — C9000 Multiple myeloma not having achieved remission: Secondary | ICD-10-CM | POA: Diagnosis not present

## 2023-08-16 DIAGNOSIS — Z79899 Other long term (current) drug therapy: Secondary | ICD-10-CM | POA: Diagnosis not present

## 2023-08-16 DIAGNOSIS — R0789 Other chest pain: Secondary | ICD-10-CM | POA: Diagnosis not present

## 2023-08-16 DIAGNOSIS — Z743 Need for continuous supervision: Secondary | ICD-10-CM | POA: Diagnosis not present

## 2023-08-16 DIAGNOSIS — T451X5A Adverse effect of antineoplastic and immunosuppressive drugs, initial encounter: Secondary | ICD-10-CM | POA: Diagnosis not present

## 2023-08-16 DIAGNOSIS — I10 Essential (primary) hypertension: Secondary | ICD-10-CM | POA: Diagnosis not present

## 2023-08-16 DIAGNOSIS — Z7951 Long term (current) use of inhaled steroids: Secondary | ICD-10-CM | POA: Diagnosis not present

## 2023-08-16 DIAGNOSIS — Z95810 Presence of automatic (implantable) cardiac defibrillator: Secondary | ICD-10-CM | POA: Diagnosis not present

## 2023-08-16 DIAGNOSIS — R6889 Other general symptoms and signs: Secondary | ICD-10-CM | POA: Diagnosis not present

## 2023-08-16 LAB — KAPPA/LAMBDA LIGHT CHAINS
Kappa free light chain: 14 mg/L (ref 3.3–19.4)
Kappa, lambda light chain ratio: 3.59 — ABNORMAL HIGH (ref 0.26–1.65)
Lambda free light chains: 3.9 mg/L — ABNORMAL LOW (ref 5.7–26.3)

## 2023-08-16 NOTE — Telephone Encounter (Signed)
Lvm for pt to call our office back.

## 2023-08-16 NOTE — Telephone Encounter (Signed)
Pt called and stated that she wanted to make an appointment for being seen in the ER twice. I offered to schedule her an appt with Ladona Ridgel or Efraim Kaufmann to be seen sooner, however, pt stated that she was already supposed to have a follow-up appt scheduled with Dr. Abner Greenspan.  Pt expressed that she doesn't want to wait til December to be seen with Dr. Abner Greenspan and requested to speak with Shamaine directly to discuss her health concerns and to be squeezed into an earlier appt. Please call and advise pt.

## 2023-08-16 NOTE — Telephone Encounter (Signed)
Called pt back to discuss Dr.Blyth not having any appt.

## 2023-08-17 ENCOUNTER — Telehealth: Payer: Self-pay | Admitting: *Deleted

## 2023-08-17 ENCOUNTER — Ambulatory Visit: Payer: Self-pay

## 2023-08-17 DIAGNOSIS — R079 Chest pain, unspecified: Secondary | ICD-10-CM | POA: Diagnosis not present

## 2023-08-17 DIAGNOSIS — R112 Nausea with vomiting, unspecified: Secondary | ICD-10-CM | POA: Diagnosis not present

## 2023-08-17 DIAGNOSIS — T451X5A Adverse effect of antineoplastic and immunosuppressive drugs, initial encounter: Secondary | ICD-10-CM | POA: Diagnosis not present

## 2023-08-17 NOTE — Progress Notes (Signed)
Care Coordination Note  08/17/2023 Name: Tami [name] MRN: [id number] DOB: Feb 02, 1956  Tami [name] is a 67 y.o. year old female who is a primary care patient of [name], MD and is actively engaged with the care management team. I reached out to [name] by phone today to assist with scheduling an initial visit with the BSW  Follow up plan: Telephone appointment with care management team member scheduled for:08/30/2023  Burman Nieves, [facility] Care Coordination Care Guide Direct Dial: [phone]

## 2023-08-17 NOTE — Telephone Encounter (Signed)
Called pt was advised Dr.Blyth will be out of the office and pt stated understand. Pt stated she was in hospital.

## 2023-08-17 NOTE — Patient Outreach (Signed)
Care Coordination   Follow Up Visit Note   08/17/2023 Name: Tami [name] MRN: [id number] DOB: Apr 05, 1956  Tami [name] is a 67 y.o. year old female who sees [name], MD for primary care. I spoke with Tami [name](HPOA) by phone today.  What matters to the patients health and wellness today?  Per Ms. [name], patient is currently in the hospital with possible discharge today. Previously involved with Tami Hines, LCSW regarding in home care resources. Tami [name] request update on transition from [facility] to [facility] program. Tami [name] lives in [place] and expresses she would like family member to be able to care for patient and be able to attend provider appointments with patient.   Goals Addressed             This Visit's Progress    Assistance with Health Management       Interventions Today    Flowsheet Row Most Recent Value  Chronic Disease   Chronic disease during today's visit Other  [multiple myoloma]  General Interventions   General Interventions Discussed/Reviewed General Interventions Reviewed, Community Resources, Level of Care  [Evaluation of current treatment plan for health condition and patient's adherence to plan.]  Communication with Social Work  [regarding in home care services]  Level of Care Personal Care Services  [and [facility] program]            SDOH assessments and interventions completed:  No  Care Coordination Interventions:  Yes, provided   Follow up plan: Follow up call scheduled for 08/31/23    Encounter Outcome:  Patient Visit Completed   Tami Sheriff, RN, MSN, BSN, CCM Care Management Coordinator [phone]

## 2023-08-18 DIAGNOSIS — R112 Nausea with vomiting, unspecified: Secondary | ICD-10-CM | POA: Diagnosis not present

## 2023-08-18 DIAGNOSIS — T451X5A Adverse effect of antineoplastic and immunosuppressive drugs, initial encounter: Secondary | ICD-10-CM | POA: Diagnosis not present

## 2023-08-19 ENCOUNTER — Ambulatory Visit: Payer: 59 | Admitting: Internal Medicine

## 2023-08-19 DIAGNOSIS — T451X5A Adverse effect of antineoplastic and immunosuppressive drugs, initial encounter: Secondary | ICD-10-CM | POA: Diagnosis not present

## 2023-08-19 DIAGNOSIS — R112 Nausea with vomiting, unspecified: Secondary | ICD-10-CM | POA: Diagnosis not present

## 2023-08-20 ENCOUNTER — Inpatient Hospital Stay: Payer: 59

## 2023-08-20 VITALS — BP 108/78 | HR 99 | Temp 98.0°F | Resp 16 | Wt 189.0 lb

## 2023-08-20 DIAGNOSIS — C9000 Multiple myeloma not having achieved remission: Secondary | ICD-10-CM

## 2023-08-20 DIAGNOSIS — Z5112 Encounter for antineoplastic immunotherapy: Secondary | ICD-10-CM | POA: Diagnosis not present

## 2023-08-20 DIAGNOSIS — C9002 Multiple myeloma in relapse: Secondary | ICD-10-CM

## 2023-08-20 LAB — CBC WITH DIFFERENTIAL (CANCER CENTER ONLY)
Abs Immature Granulocytes: 0.03 10*3/uL (ref 0.00–0.07)
Basophils Absolute: 0 10*3/uL (ref 0.0–0.1)
Basophils Relative: 1 %
Eosinophils Absolute: 0.1 10*3/uL (ref 0.0–0.5)
Eosinophils Relative: 1 %
HCT: 33.8 % — ABNORMAL LOW (ref 36.0–46.0)
Hemoglobin: 11.1 g/dL — ABNORMAL LOW (ref 12.0–15.0)
Immature Granulocytes: 1 %
Lymphocytes Relative: 14 %
Lymphs Abs: 0.6 10*3/uL — ABNORMAL LOW (ref 0.7–4.0)
MCH: 29.8 pg (ref 26.0–34.0)
MCHC: 32.8 g/dL (ref 30.0–36.0)
MCV: 90.9 fL (ref 80.0–100.0)
Monocytes Absolute: 0.4 10*3/uL (ref 0.1–1.0)
Monocytes Relative: 8 %
Neutro Abs: 3.3 10*3/uL (ref 1.7–7.7)
Neutrophils Relative %: 75 %
Platelet Count: 92 10*3/uL — ABNORMAL LOW (ref 150–400)
RBC: 3.72 MIL/uL — ABNORMAL LOW (ref 3.87–5.11)
RDW: 14.6 % (ref 11.5–15.5)
WBC Count: 4.4 10*3/uL (ref 4.0–10.5)
nRBC: 0 % (ref 0.0–0.2)

## 2023-08-20 LAB — PROTEIN ELECTROPHORESIS, SERUM, WITH REFLEX
A/G Ratio: 0.8 (ref 0.7–1.7)
Albumin ELP: 3.2 g/dL (ref 2.9–4.4)
Alpha-1-Globulin: 0.2 g/dL (ref 0.0–0.4)
Alpha-2-Globulin: 0.8 g/dL (ref 0.4–1.0)
Beta Globulin: 1 g/dL (ref 0.7–1.3)
Gamma Globulin: 2 g/dL — ABNORMAL HIGH (ref 0.4–1.8)
Globulin, Total: 4.1 g/dL — ABNORMAL HIGH (ref 2.2–3.9)
M-Spike, %: 1.4 g/dL — ABNORMAL HIGH
SPEP Interpretation: 0
Total Protein ELP: 7.3 g/dL (ref 6.0–8.5)

## 2023-08-20 LAB — IMMUNOFIXATION REFLEX, SERUM
IgA: 68 mg/dL — ABNORMAL LOW (ref 87–352)
IgG (Immunoglobin G), Serum: 2415 mg/dL — ABNORMAL HIGH (ref 586–1602)
IgM (Immunoglobulin M), Srm: 66 mg/dL (ref 26–217)

## 2023-08-20 LAB — CMP (CANCER CENTER ONLY)
ALT: 13 U/L (ref 0–44)
AST: 16 U/L (ref 15–41)
Albumin: 3.4 g/dL — ABNORMAL LOW (ref 3.5–5.0)
Alkaline Phosphatase: 40 U/L (ref 38–126)
Anion gap: 9 (ref 5–15)
BUN: 20 mg/dL (ref 8–23)
CO2: 24 mmol/L (ref 22–32)
Calcium: 8.8 mg/dL — ABNORMAL LOW (ref 8.9–10.3)
Chloride: 106 mmol/L (ref 98–111)
Creatinine: 0.72 mg/dL (ref 0.44–1.00)
GFR, Estimated: >60 mL/min (ref >60)
Glucose, Bld: 150 mg/dL — ABNORMAL HIGH (ref 70–99)
Potassium: 3.8 mmol/L (ref 3.5–5.1)
Sodium: 139 mmol/L (ref 135–145)
Total Bilirubin: 0.5 mg/dL (ref 0.3–1.2)
Total Protein: 7.9 g/dL (ref 6.5–8.1)

## 2023-08-20 MED ORDER — HEPARIN SOD (PORK) LOCK FLUSH 100 UNIT/ML IV SOLN
500.0000 [IU] | Freq: Once | INTRAVENOUS | Status: AC | PRN
  Administered 2023-08-20: 500 [IU]

## 2023-08-20 MED ORDER — SODIUM CHLORIDE 0.9% FLUSH
10.0000 mL | INTRAVENOUS | Status: DC | PRN
  Administered 2023-08-20: 10 mL

## 2023-08-20 MED ORDER — DEXTROSE 5 % IV SOLN
56.0000 mg/m2 | Freq: Once | INTRAVENOUS | Status: AC
  Administered 2023-08-20: 110 mg via INTRAVENOUS
  Filled 2023-08-20: qty 30

## 2023-08-20 MED ORDER — SODIUM CHLORIDE 0.9 % IV SOLN: Freq: Once | INTRAVENOUS | Status: DC

## 2023-08-20 MED ORDER — SODIUM CHLORIDE 0.9 % IV SOLN
20.0000 mg | Freq: Once | INTRAVENOUS | Status: AC
  Administered 2023-08-20: 20 mg via INTRAVENOUS
  Filled 2023-08-20: qty 20

## 2023-08-20 MED ORDER — SODIUM CHLORIDE 0.9 % IV SOLN: Freq: Once | INTRAVENOUS | Status: AC

## 2023-08-20 NOTE — Progress Notes (Signed)
Okay to treat with pltc 92 per Dr. Marin Olp

## 2023-08-20 NOTE — Progress Notes (Signed)
MD reviewed CBC cmet, ok to treat despite counts

## 2023-08-20 NOTE — Patient Instructions (Signed)
Carfilzomib Injection What is this medication? CARFILZOMIB (kar FILZ oh mib) treats multiple myeloma, a type of bone marrow cancer. It works by blocking a protein that causes cancer cells to grow and multiply. This helps to slow or stop the spread of cancer cells. This medicine may be used for other purposes; ask your health care provider or pharmacist if you have questions. COMMON BRAND NAME(S): KYPROLIS What should I tell my care team before I take this medication? They need to know if you have any of these conditions: Heart disease History of blood clots Irregular heartbeat Kidney disease Liver disease Lung or breathing disease An unusual or allergic reaction to carfilzomib, or other medications, foods, dyes, or preservatives If you or your partner are pregnant or trying to get pregnant Breastfeeding How should I use this medication? This medication is injected into a vein. It is given by your care team in a hospital or clinic setting. Talk to your care team about the use of this medication in children. Special care may be needed. Overdosage: If you think you have taken too much of this medicine contact a poison control center or emergency room at once. NOTE: This medicine is only for you. Do not share this medicine with others. What if I miss a dose? Keep appointments for follow-up doses. It is important not to miss your dose. Call your care team if you are unable to keep an appointment. What may interact with this medication? Interactions are not expected. This list may not describe all possible interactions. Give your health care provider a list of all the medicines, herbs, non-prescription drugs, or dietary supplements you use. Also tell them if you smoke, drink alcohol, or use illegal drugs. Some items may interact with your medicine. What should I watch for while using this medication? Your condition will be monitored carefully while you are receiving this medication. You may need  blood work while taking this medication. Check with your care team if you have severe diarrhea, nausea, and vomiting, or if you sweat a lot. The loss of too much body fluid may make it dangerous for you to take this medication. This medication may affect your coordination, reaction time, or judgment. Do not drive or operate machinery until you know how this medication affects you. Sit up or stand slowly to reduce the risk of dizzy or fainting spells. Drinking alcohol with this medication can increase the risk of these side effects. Talk to your care team if you may be pregnant. Serious birth defects can occur if you take this medication during pregnancy and for 6 months after the last dose. You will need a negative pregnancy test before starting this medication. Contraception is recommended while taking this medication and for 6 months after the last dose. Your care team can help you find an option that works for you. If your partner can get pregnant, use a condom during sex while taking this medication and for 3 months after the last dose. Do not breastfeed while taking this medication and for 2 weeks after the last dose. This medication may cause infertility. Talk to your care team if you are concerned about your fertility. What side effects may I notice from receiving this medication? Side effects that you should report to your care team as soon as possible: Allergic reactions--skin rash, itching, hives, swelling of the face, lips, tongue, or throat Bleeding--bloody or black, tar-like stools, vomiting blood or brown material that looks like coffee grounds, red or dark brown urine,  small red or purple spots on skin, unusual bruising or bleeding Blood clot--pain, swelling, or warmth in the leg, shortness of breath, chest pain Dizziness, loss of balance or coordination, confusion or trouble speaking Heart attack--pain or tightness in the chest, shoulders, arms, or jaw, nausea, shortness of breath, cold  or clammy skin, feeling faint or lightheaded Heart failure--shortness of breath, swelling of the ankles, feet, or hands, sudden weight gain, unusual weakness or fatigue Heart rhythm changes--fast or irregular heartbeat, dizziness, feeling faint or lightheaded, chest pain, trouble breathing Increase in blood pressure Infection--fever, chills, cough, sore throat, wounds that don't heal, pain or trouble when passing urine, general feeling of discomfort or being unwell Infusion reactions--chest pain, shortness of breath or trouble breathing, feeling faint or lightheaded Kidney injury--decrease in the amount of urine, swelling of the ankles, hands, or feet Liver injury--right upper belly pain, loss of appetite, nausea, light-colored stool, dark yellow or brown urine, yellowing skin or eyes, unusual weakness or fatigue Lung injury--shortness of breath or trouble breathing, cough, spitting up blood, chest pain, fever Pulmonary hypertension--shortness of breath, chest pain, fast or irregular heartbeat, feeling faint or lightheaded, fatigue, swelling of the ankles or feet Stomach pain, bloody diarrhea, pale skin, unusual weakness or fatigue, decrease in the amount of urine, which may be signs of hemolytic uremic syndrome Sudden and severe headache, confusion, change in vision, seizures, which may be signs of posterior reversible encephalopathy syndrome (PRES) TTP--purple spots on the skin or inside the mouth, pale skin, yellowing skin or eyes, unusual weakness or fatigue, fever, fast or irregular heartbeat, confusion, change in vision, trouble speaking, trouble walking Tumor lysis syndrome (TLS)--nausea, vomiting, diarrhea, decrease in the amount of urine, dark urine, unusual weakness or fatigue, confusion, muscle pain or cramps, fast or irregular heartbeat, joint pain Side effects that usually do not require medical attention (report to your care team if they continue or are  bothersome): Diarrhea Fatigue Nausea Trouble sleeping This list may not describe all possible side effects. Call your doctor for medical advice about side effects. You may report side effects to FDA at 1-800-FDA-1088. Where should I keep my medication? This medication is given in a hospital or clinic. It will not be stored at home. NOTE: This sheet is a summary. It may not cover all possible information. If you have questions about this medicine, talk to your doctor, pharmacist, or health care provider.  2024 Elsevier/Gold Standard (2022-03-26 00:00:00)

## 2023-08-25 ENCOUNTER — Inpatient Hospital Stay: Payer: 59

## 2023-08-25 ENCOUNTER — Other Ambulatory Visit: Payer: Self-pay | Admitting: *Deleted

## 2023-08-25 ENCOUNTER — Other Ambulatory Visit: Payer: Self-pay

## 2023-08-25 ENCOUNTER — Telehealth: Payer: Self-pay | Admitting: Family Medicine

## 2023-08-25 ENCOUNTER — Telehealth: Payer: Self-pay | Admitting: *Deleted

## 2023-08-25 DIAGNOSIS — Z5112 Encounter for antineoplastic immunotherapy: Secondary | ICD-10-CM | POA: Diagnosis not present

## 2023-08-25 DIAGNOSIS — C9002 Multiple myeloma in relapse: Secondary | ICD-10-CM

## 2023-08-25 DIAGNOSIS — D500 Iron deficiency anemia secondary to blood loss (chronic): Secondary | ICD-10-CM

## 2023-08-25 DIAGNOSIS — D801 Nonfamilial hypogammaglobulinemia: Secondary | ICD-10-CM

## 2023-08-25 DIAGNOSIS — C9000 Multiple myeloma not having achieved remission: Secondary | ICD-10-CM | POA: Diagnosis not present

## 2023-08-25 DIAGNOSIS — E860 Dehydration: Secondary | ICD-10-CM

## 2023-08-25 LAB — COMPREHENSIVE METABOLIC PANEL
ALT: 13 U/L (ref 0–44)
AST: 16 U/L (ref 15–41)
Albumin: 3.3 g/dL — ABNORMAL LOW (ref 3.5–5.0)
Alkaline Phosphatase: 45 U/L (ref 38–126)
Anion gap: 8 (ref 5–15)
BUN: 16 mg/dL (ref 8–23)
CO2: 26 mmol/L (ref 22–32)
Calcium: 9.2 mg/dL (ref 8.9–10.3)
Chloride: 106 mmol/L (ref 98–111)
Creatinine, Ser: 0.71 mg/dL (ref 0.44–1.00)
GFR, Estimated: >60 mL/min (ref >60)
Glucose, Bld: 144 mg/dL — ABNORMAL HIGH (ref 70–99)
Potassium: 3.5 mmol/L (ref 3.5–5.1)
Sodium: 140 mmol/L (ref 135–145)
Total Bilirubin: 0.4 mg/dL (ref 0.3–1.2)
Total Protein: 7.6 g/dL (ref 6.5–8.1)

## 2023-08-25 LAB — CBC WITH DIFFERENTIAL (CANCER CENTER ONLY)
Abs Immature Granulocytes: 0.01 10*3/uL (ref 0.00–0.07)
Basophils Absolute: 0 10*3/uL (ref 0.0–0.1)
Basophils Relative: 0 %
Eosinophils Absolute: 0 10*3/uL (ref 0.0–0.5)
Eosinophils Relative: 2 %
HCT: 33.9 % — ABNORMAL LOW (ref 36.0–46.0)
Hemoglobin: 11 g/dL — ABNORMAL LOW (ref 12.0–15.0)
Immature Granulocytes: 0 %
Lymphocytes Relative: 30 %
Lymphs Abs: 0.8 10*3/uL (ref 0.7–4.0)
MCH: 29.3 pg (ref 26.0–34.0)
MCHC: 32.4 g/dL (ref 30.0–36.0)
MCV: 90.4 fL (ref 80.0–100.0)
Monocytes Absolute: 0.3 10*3/uL (ref 0.1–1.0)
Monocytes Relative: 10 %
Neutro Abs: 1.6 10*3/uL — ABNORMAL LOW (ref 1.7–7.7)
Neutrophils Relative %: 58 %
Platelet Count: 89 10*3/uL — ABNORMAL LOW (ref 150–400)
RBC: 3.75 MIL/uL — ABNORMAL LOW (ref 3.87–5.11)
RDW: 14.4 % (ref 11.5–15.5)
WBC Count: 2.7 10*3/uL — ABNORMAL LOW (ref 4.0–10.5)
nRBC: 0.7 % — ABNORMAL HIGH (ref 0.0–0.2)

## 2023-08-25 LAB — MAGNESIUM: Magnesium: 1.8 mg/dL (ref 1.7–2.4)

## 2023-08-25 MED ORDER — SODIUM CHLORIDE 0.9% FLUSH
10.0000 mL | Freq: Once | INTRAVENOUS | Status: AC | PRN
  Administered 2023-08-25: 10 mL

## 2023-08-25 MED ORDER — SODIUM CHLORIDE 0.9 % IV SOLN: INTRAVENOUS | Status: AC

## 2023-08-25 MED ORDER — METRONIDAZOLE 500 MG PO TABS
500.0000 mg | ORAL_TABLET | Freq: Three times a day (TID) | ORAL | 2 refills | Status: DC
Start: 2023-08-25 — End: 2023-09-22

## 2023-08-25 MED ORDER — HEPARIN SOD (PORK) LOCK FLUSH 100 UNIT/ML IV SOLN
500.0000 [IU] | Freq: Once | INTRAVENOUS | Status: AC | PRN
  Administered 2023-08-25: 500 [IU]

## 2023-08-25 NOTE — Patient Instructions (Signed)
Dehydration, Adult Dehydration is a condition in which there is not enough water or other fluids in the body. This happens when a person loses more fluids than they take in. Important organs cannot work right without the right amount of fluids. Any loss of fluids from the body can cause dehydration. Dehydration can be mild, worse, or very bad. It should be treated right away to keep it from getting very bad. What are the causes? Conditions that cause loss of water in the body. They include: Watery poop (diarrhea). Vomiting. Sweating a lot. Fever. Infection. Peeing (urinating) a lot. Not drinking enough fluids. Certain medicines, such as medicines that take extra fluid out of the body (diuretics). Lack of safe drinking water. Not being able to get enough water and food. What increases the risk? Having a long-term (chronic) illness that has not been treated the right way, such as: Diabetes. Heart disease. Kidney disease. Being 63 years of age or older. Having a disability. Living in a place that is high above the ground or sea (high in altitude). The thinner, drier air causes more fluid loss. Doing exercises that put stress on your body for a long time. Being active when in hot places. What are the signs or symptoms? Symptoms of dehydration depend on how bad it is. Mild or worse dehydration Thirst. Dry lips or dry mouth. Feeling dizzy or light-headed. Muscle cramps. Passing little pee or dark pee. Pee may be the color of tea. Headache. Very bad dehydration Changes in skin. Skin may: Be cold to the touch (clammy). Be blotchy or pale. Not go back to normal right after you pinch it and let it go. Little or no tears, pee, or sweat. Fast breathing. Low blood pressure. Weak pulse. Pulse that is more than 100 beats a minute when you are sitting still. Other changes, such as: Feeling very thirsty. Eyes that look hollow (sunken). Cold hands and feet. Being confused. Being very  tired (lethargic) or having trouble waking from sleep. Losing weight. Loss of consciousness. How is this treated? Treatment for this condition depends on how bad your dehydration is. Treatment should start right away. Do not wait until your condition gets very bad. Very bad dehydration is an emergency. You will need to go to a hospital. Mild or worse dehydration can be treated at home. You may be asked to: Drink more fluids. Drink an oral rehydration solution (ORS). This drink gives you the right amount of fluids, salts, and minerals (electrolytes). Very bad dehydration can be treated: With fluids through an IV tube. By correcting low levels of electrolytes in the body. By treating the problem that caused your dehydration. Follow these instructions at home: Oral rehydration solution If told by your doctor, drink an ORS: Make an ORS. Use instructions on the package. Start by drinking small amounts, about  cup (120 mL) every 5-10 minutes. Slowly drink more until you have had the amount that your doctor said to have.  Eating and drinking  Drink enough clear fluid to keep your pee pale yellow. If you were told to drink an ORS, finish the ORS first. Then, start slowly drinking other clear fluids. Drink fluids such as: Water. Do not drink only water. Doing that can make the salt (sodium) level in your body get too low. Water from ice chips you suck on. Fruit juice that you have added water to (diluted). Low-calorie sports drinks. Eat foods that have the right amounts of salts and minerals, such as bananas, oranges, potatoes,  tomatoes, or spinach. Do not drink alcohol. Avoid drinks that have caffeine or sugar. These include:: High-calorie sports drinks. Fruit juice that you did not add water to. Soda. Coffee or energy drinks. Avoid foods that are greasy or have a lot of fat or sugar. General instructions Take over-the-counter and prescription medicines only as told by your doctor. Do  not take sodium tablets. Doing that can make the salt level in your body get too high. Return to your normal activities as told by your doctor. Ask your doctor what activities are safe for you. Keep all follow-up visits. Your doctor may check and change your treatment. Contact a doctor if: You have pain in your belly (abdomen) and the pain: Gets worse. Stays in one place. You have a rash. You have a stiff neck. You get angry or annoyed more easily than normal. You are more tired or have a harder time waking than normal. You feel weak or dizzy. You feel very thirsty. Get help right away if: You have any symptoms of very bad dehydration. You vomit every time you eat or drink. Your vomiting gets worse, does not go away, or you vomit blood or green stuff. You are getting treatment, but symptoms are getting worse. You have a fever. You have a very bad headache. You have: Diarrhea that gets worse or does not go away. Blood in your poop (stool). This may cause poop to look black and tarry. No pee in 6-8 hours. Only a small amount of pee in 6-8 hours, and the pee is very dark. You have trouble breathing. These symptoms may be an emergency. Get help right away. Call 911. Do not wait to see if the symptoms will go away. Do not drive yourself to the hospital. This information is not intended to replace advice given to you by your health care provider. Make sure you discuss any questions you have with your health care provider. Document Revised: 05/25/2022 Document Reviewed: 05/25/2022 Elsevier Patient Education  2024 [employer].

## 2023-08-25 NOTE — Telephone Encounter (Signed)
Pt came in to schedule a appt regarding her breathing machine and diarrhea. Pt was informed that pcp is booked out until jan 30th, pt didn't want to see anyone else. Pt asks that PCP's CMA call and speak with here regarding her breathing machine that her POA called about last week. Please call pt and advise on breathing machine.

## 2023-08-25 NOTE — Telephone Encounter (Signed)
Received a call from patient stating that she has had 3-4 diarrhea stools for the past 3 days.  Has already had 4 diarrhea stools this morning.  Talked with patient about the IV shortage and asked how she was taking po.  Patient said she was trying to drink but is feeling very bad.  Spoke with Dr Myna Hidalgo who stated that patient is at risk and bring her in for IV and labs. Patient is on Lomotil and Imodium and taking correctly.  Appt made

## 2023-08-26 NOTE — Telephone Encounter (Signed)
Called pt scheduled appt to discuss breath machine. Pt stated understand.

## 2023-08-27 ENCOUNTER — Inpatient Hospital Stay: Payer: 59

## 2023-08-27 VITALS — BP 162/80 | HR 79 | Temp 97.6°F | Resp 18

## 2023-08-27 DIAGNOSIS — C9000 Multiple myeloma not having achieved remission: Secondary | ICD-10-CM

## 2023-08-27 DIAGNOSIS — Z5112 Encounter for antineoplastic immunotherapy: Secondary | ICD-10-CM | POA: Diagnosis not present

## 2023-08-27 DIAGNOSIS — C9002 Multiple myeloma in relapse: Secondary | ICD-10-CM

## 2023-08-27 LAB — CMP (CANCER CENTER ONLY)
ALT: 17 U/L (ref 0–44)
AST: 22 U/L (ref 15–41)
Albumin: 3.4 g/dL — ABNORMAL LOW (ref 3.5–5.0)
Alkaline Phosphatase: 50 U/L (ref 38–126)
Anion gap: 8 (ref 5–15)
BUN: 17 mg/dL (ref 8–23)
CO2: 28 mmol/L (ref 22–32)
Calcium: 8.8 mg/dL — ABNORMAL LOW (ref 8.9–10.3)
Chloride: 107 mmol/L (ref 98–111)
Creatinine: 0.64 mg/dL (ref 0.44–1.00)
GFR, Estimated: >60 mL/min (ref >60)
Glucose, Bld: 121 mg/dL — ABNORMAL HIGH (ref 70–99)
Potassium: 3.7 mmol/L (ref 3.5–5.1)
Sodium: 143 mmol/L (ref 135–145)
Total Bilirubin: 0.4 mg/dL (ref 0.3–1.2)
Total Protein: 7.3 g/dL (ref 6.5–8.1)

## 2023-08-27 LAB — CBC WITH DIFFERENTIAL (CANCER CENTER ONLY)
Abs Immature Granulocytes: 0.01 10*3/uL (ref 0.00–0.07)
Basophils Absolute: 0 10*3/uL (ref 0.0–0.1)
Basophils Relative: 0 %
Eosinophils Absolute: 0.1 10*3/uL (ref 0.0–0.5)
Eosinophils Relative: 2 %
HCT: 32.3 % — ABNORMAL LOW (ref 36.0–46.0)
Hemoglobin: 10.6 g/dL — ABNORMAL LOW (ref 12.0–15.0)
Immature Granulocytes: 0 %
Lymphocytes Relative: 21 %
Lymphs Abs: 0.5 10*3/uL — ABNORMAL LOW (ref 0.7–4.0)
MCH: 29.9 pg (ref 26.0–34.0)
MCHC: 32.8 g/dL (ref 30.0–36.0)
MCV: 91 fL (ref 80.0–100.0)
Monocytes Absolute: 0.3 10*3/uL (ref 0.1–1.0)
Monocytes Relative: 12 %
Neutro Abs: 1.7 10*3/uL (ref 1.7–7.7)
Neutrophils Relative %: 65 %
Platelet Count: 114 10*3/uL — ABNORMAL LOW (ref 150–400)
RBC: 3.55 MIL/uL — ABNORMAL LOW (ref 3.87–5.11)
RDW: 14.6 % (ref 11.5–15.5)
WBC Count: 2.5 10*3/uL — ABNORMAL LOW (ref 4.0–10.5)
nRBC: 0 % (ref 0.0–0.2)

## 2023-08-27 MED ORDER — HEPARIN SOD (PORK) LOCK FLUSH 100 UNIT/ML IV SOLN
500.0000 [IU] | Freq: Once | INTRAVENOUS | Status: AC | PRN
  Administered 2023-08-27: 500 [IU]

## 2023-08-27 MED ORDER — SODIUM CHLORIDE 0.9 % IV SOLN
10.0000 mg/kg | Freq: Once | INTRAVENOUS | Status: AC
  Administered 2023-08-27: 900 mg via INTRAVENOUS
  Filled 2023-08-27: qty 25

## 2023-08-27 MED ORDER — ACETAMINOPHEN 325 MG PO TABS
650.0000 mg | ORAL_TABLET | Freq: Once | ORAL | Status: DC
  Filled 2023-08-27: qty 2

## 2023-08-27 MED ORDER — SODIUM CHLORIDE 0.9 % IV SOLN
20.0000 mg | Freq: Once | INTRAVENOUS | Status: AC
  Administered 2023-08-27: 20 mg via INTRAVENOUS
  Filled 2023-08-27: qty 20

## 2023-08-27 MED ORDER — FAMOTIDINE IN NACL 20-0.9 MG/50ML-% IV SOLN
20.0000 mg | Freq: Once | INTRAVENOUS | Status: AC
  Administered 2023-08-27: 20 mg via INTRAVENOUS
  Filled 2023-08-27: qty 50

## 2023-08-27 MED ORDER — SODIUM CHLORIDE 0.9 % IV SOLN
300.0000 mg/m2 | Freq: Once | INTRAVENOUS | Status: AC
  Administered 2023-08-27: 600 mg via INTRAVENOUS
  Filled 2023-08-27: qty 30

## 2023-08-27 MED ORDER — SODIUM CHLORIDE 0.9% FLUSH
10.0000 mL | INTRAVENOUS | Status: DC | PRN
  Administered 2023-08-27: 10 mL

## 2023-08-27 MED ORDER — DIPHENHYDRAMINE HCL 50 MG/ML IJ SOLN
50.0000 mg | Freq: Once | INTRAMUSCULAR | Status: AC
  Administered 2023-08-27: 50 mg via INTRAVENOUS
  Filled 2023-08-27: qty 1

## 2023-08-27 MED ORDER — DEXTROSE 5 % IV SOLN
56.0000 mg/m2 | Freq: Once | INTRAVENOUS | Status: AC
  Administered 2023-08-27: 110 mg via INTRAVENOUS
  Filled 2023-08-27: qty 30

## 2023-08-27 MED ORDER — SODIUM CHLORIDE 0.9 % IV SOLN: Freq: Once | INTRAVENOUS | Status: AC

## 2023-08-27 NOTE — Patient Instructions (Signed)
[facility]  Discharge Instructions: Thank you for choosing [facility] to provide your oncology and hematology care.   If you have a lab appointment with the Cancer Center, please go directly to the Cancer Center and check in at the registration area.  Wear comfortable clothing and clothing appropriate for easy access to any Portacath or PICC line.   We strive to give you quality time with your provider. You may need to reschedule your appointment if you arrive late (15 or more minutes).  Arriving late affects you and other patients whose appointments are after yours.  Also, if you miss three or more appointments without notifying the office, you may be dismissed from the clinic at the provider's discretion.      For prescription refill requests, have your pharmacy contact our office and allow 72 hours for refills to be completed.    Today you received the following chemotherapy and/or immunotherapy agents Kyprolis, Cytoxan, Sarclisa       To help prevent nausea and vomiting after your treatment, we encourage you to take your nausea medication as directed.  BELOW ARE SYMPTOMS THAT SHOULD BE REPORTED IMMEDIATELY: *FEVER GREATER THAN 100.4 F (38 C) OR HIGHER *CHILLS OR SWEATING *NAUSEA AND VOMITING THAT IS NOT CONTROLLED WITH YOUR NAUSEA MEDICATION *UNUSUAL SHORTNESS OF BREATH *UNUSUAL BRUISING OR BLEEDING *URINARY PROBLEMS (pain or burning when urinating, or frequent urination) *BOWEL PROBLEMS (unusual diarrhea, constipation, pain near the anus) TENDERNESS IN MOUTH AND THROAT WITH OR WITHOUT PRESENCE OF ULCERS (sore throat, sores in mouth, or a toothache) UNUSUAL RASH, SWELLING OR PAIN  UNUSUAL VAGINAL DISCHARGE OR ITCHING   Items with * indicate a potential emergency and should be followed up as soon as possible or go to the Emergency Department if any problems should occur.  Please show the CHEMOTHERAPY ALERT CARD or IMMUNOTHERAPY  ALERT CARD at check-in to the Emergency Department and triage nurse. Should you have questions after your visit or need to cancel or reschedule your appointment, please contact [facility]  [phone] and follow the prompts.  Office hours are 8:00 a.m. to 4:30 p.m. Monday - Friday. Please note that voicemails left after 4:00 p.m. may not be returned until the following business day.  We are closed weekends and major holidays. You have access to a nurse at all times for urgent questions. Please call the main number to the clinic [phone] and follow the prompts.  For any non-urgent questions, you may also contact your provider using MyChart. We now offer e-Visits for anyone 13 and older to request care online for non-urgent symptoms. For details visit [email or web address].   Also download the MyChart app! Go to the app store, search "MyChart", open the app, select [facility], and log in with your MyChart username and password.

## 2023-08-27 NOTE — Patient Instructions (Signed)

## 2023-08-30 ENCOUNTER — Ambulatory Visit: Payer: Self-pay

## 2023-08-30 NOTE — Patient Instructions (Signed)
Visit Information  Thank you for taking time to visit with me today. Please don't hesitate to contact me if I can be of assistance to you.   Following are the goals we discussed today:   Goals Addressed             This Visit's Progress    COMPLETED: Care Coordination Activities       Care Coordination Interventions: Contacted [place] [employer] to confirm patients completed application was received. Advised patient was approved and added to the wait list which is approximately 30 - 60 days long SW spoke with the patient and her cousin [name] on a 3 way call to confirm patient is aware she has been placed on the wait list Reviewed the wait list time and discussed the patient will be contacted by the CAP program when it is time to move forward with next steps Discussed patient is unhappy with PCS in the home due to concerns with reliability Determined the patient is aware she may request a new PCS provider but has chosen to continue with the current agency while awaiting approval for CAP. The patient plans to have a family member or friend provide CAP services once approved         If you are experiencing a Mental Health or Behavioral Health Crisis or need someone to talk to, please call 1-800-273-TALK (toll free, 24 hour hotline) go to [facility] [address], [place] [phone]) call 911  Patient verbalizes understanding of instructions and care plan provided today and agrees to view in MyChart. Active MyChart status and patient understanding of how to access instructions and care plan via MyChart confirmed with patient.     No further follow up required: Please contact your primary care provider as needed.  Bevelyn Ngo, BSW, CDP [facility]  [facility], [facility] Social Worker Direct Dial: [phone]  Fax: [phone]

## 2023-08-30 NOTE — Patient Outreach (Signed)
Care Coordination   Follow Up Visit Note   08/30/2023 Name: Tami [name] MRN: [id number] DOB: March 05, 1956  Tami [name] is a 67 y.o. year old female who sees [name], MD for primary care. I spoke with  [name] by phone today.  What matters to the patients health and wellness today?  The patient would like to transition from [employer] to CAP/DA services. The patient is currently on the wait list for CAP services and will be contacted by [facility] regarding next steps when appropriate.     Goals Addressed             This Visit's Progress    COMPLETED: Care Coordination Activities       Care Coordination Interventions: Contacted [place] [employer] to confirm patients completed application was received. Advised patient was approved and added to the wait list which is approximately 30 - 60 days long SW spoke with the patient and her cousin Tami [name] on a 3 way call to confirm patient is aware she has been placed on the wait list Reviewed the wait list time and discussed the patient will be contacted by the CAP program when it is time to move forward with next steps Discussed patient is unhappy with PCS in the home due to concerns with reliability Determined the patient is aware she may request a new PCS provider but has chosen to continue with the current agency while awaiting approval for CAP. The patient plans to have a family member or friend provide CAP services once approved         SDOH assessments and interventions completed:  No     Care Coordination Interventions:  Yes, provided   Interventions Today    Flowsheet Row Most Recent Value  Chronic Disease   Chronic disease during today's visit Other  [multiple myeloma]  General Interventions   General Interventions Discussed/Reviewed General Interventions Reviewed, Level of Care  Level of Care Applications  [Contacted [place] [employer] to determine status of CAP application]  Education Interventions   Education Provided  Provided Education  [Provided education on CAP Program,  patient awaiting assessment date]        Follow up plan: No further intervention required. The patient will remain engaged with RN Care Manager to address care management needs.    Encounter Outcome:  Patient Visit Completed   Bevelyn Ngo, BSW, CDP [facility]  [facility], [facility] Social Worker Direct Dial: [phone]  Fax: [phone]

## 2023-08-31 ENCOUNTER — Encounter (HOSPITAL_BASED_OUTPATIENT_CLINIC_OR_DEPARTMENT_OTHER): Payer: Self-pay | Admitting: Emergency Medicine

## 2023-08-31 ENCOUNTER — Emergency Department (HOSPITAL_BASED_OUTPATIENT_CLINIC_OR_DEPARTMENT_OTHER): Payer: 59

## 2023-08-31 ENCOUNTER — Ambulatory Visit: Payer: Self-pay

## 2023-08-31 ENCOUNTER — Emergency Department (HOSPITAL_BASED_OUTPATIENT_CLINIC_OR_DEPARTMENT_OTHER)
Admission: EM | Admit: 2023-08-31 | Discharge: 2023-08-31 | Disposition: A | Discharge status: Home / Self Care | Payer: 59 | Attending: Emergency Medicine | Admitting: Emergency Medicine

## 2023-08-31 DIAGNOSIS — Z950 Presence of cardiac pacemaker: Secondary | ICD-10-CM | POA: Diagnosis not present

## 2023-08-31 DIAGNOSIS — M19012 Primary osteoarthritis, left shoulder: Secondary | ICD-10-CM | POA: Diagnosis not present

## 2023-08-31 DIAGNOSIS — K529 Noninfective gastroenteritis and colitis, unspecified: Secondary | ICD-10-CM

## 2023-08-31 DIAGNOSIS — Z7982 Long term (current) use of aspirin: Secondary | ICD-10-CM | POA: Insufficient documentation

## 2023-08-31 DIAGNOSIS — I509 Heart failure, unspecified: Secondary | ICD-10-CM | POA: Insufficient documentation

## 2023-08-31 DIAGNOSIS — Z79899 Other long term (current) drug therapy: Secondary | ICD-10-CM | POA: Diagnosis not present

## 2023-08-31 DIAGNOSIS — M25512 Pain in left shoulder: Secondary | ICD-10-CM | POA: Diagnosis not present

## 2023-08-31 DIAGNOSIS — E860 Dehydration: Secondary | ICD-10-CM | POA: Insufficient documentation

## 2023-08-31 DIAGNOSIS — I110 Hypertensive heart disease with heart failure: Secondary | ICD-10-CM | POA: Insufficient documentation

## 2023-08-31 DIAGNOSIS — R197 Diarrhea, unspecified: Secondary | ICD-10-CM | POA: Diagnosis not present

## 2023-08-31 LAB — COMPREHENSIVE METABOLIC PANEL
ALT: 24 U/L (ref 0–44)
AST: 32 U/L (ref 15–41)
Albumin: 3.5 g/dL (ref 3.5–5.0)
Alkaline Phosphatase: 57 U/L (ref 38–126)
Anion gap: 11 (ref 5–15)
BUN: 23 mg/dL (ref 8–23)
CO2: 24 mmol/L (ref 22–32)
Calcium: 9.2 mg/dL (ref 8.9–10.3)
Chloride: 103 mmol/L (ref 98–111)
Creatinine, Ser: 0.75 mg/dL (ref 0.44–1.00)
GFR, Estimated: >60 mL/min (ref >60)
Glucose, Bld: 127 mg/dL — ABNORMAL HIGH (ref 70–99)
Potassium: 4.3 mmol/L (ref 3.5–5.1)
Sodium: 138 mmol/L (ref 135–145)
Total Bilirubin: 0.9 mg/dL (ref 0.3–1.2)
Total Protein: 8.5 g/dL — ABNORMAL HIGH (ref 6.5–8.1)

## 2023-08-31 LAB — CBC WITH DIFFERENTIAL/PLATELET
Abs Immature Granulocytes: 0.01 10*3/uL (ref 0.00–0.07)
Basophils Absolute: 0 10*3/uL (ref 0.0–0.1)
Basophils Relative: 1 %
Eosinophils Absolute: 0 10*3/uL (ref 0.0–0.5)
Eosinophils Relative: 1 %
HCT: 35.9 % — ABNORMAL LOW (ref 36.0–46.0)
Hemoglobin: 11.8 g/dL — ABNORMAL LOW (ref 12.0–15.0)
Immature Granulocytes: 0 %
Lymphocytes Relative: 30 %
Lymphs Abs: 1 10*3/uL (ref 0.7–4.0)
MCH: 29.8 pg (ref 26.0–34.0)
MCHC: 32.9 g/dL (ref 30.0–36.0)
MCV: 90.7 fL (ref 80.0–100.0)
Monocytes Absolute: 0.3 10*3/uL (ref 0.1–1.0)
Monocytes Relative: 9 %
Neutro Abs: 2.1 10*3/uL (ref 1.7–7.7)
Neutrophils Relative %: 59 %
Platelets: 111 10*3/uL — ABNORMAL LOW (ref 150–400)
RBC: 3.96 MIL/uL (ref 3.87–5.11)
RDW: 14.5 % (ref 11.5–15.5)
WBC: 3.5 10*3/uL — ABNORMAL LOW (ref 4.0–10.5)
nRBC: 1.2 % — ABNORMAL HIGH (ref 0.0–0.2)

## 2023-08-31 LAB — URINALYSIS, MICROSCOPIC (REFLEX): RBC / HPF: NONE SEEN RBC/hpf (ref 0–5)

## 2023-08-31 LAB — URINALYSIS, ROUTINE W REFLEX MICROSCOPIC
Bilirubin Urine: NEGATIVE
Glucose, UA: NEGATIVE mg/dL
Hgb urine dipstick: NEGATIVE
Ketones, ur: NEGATIVE mg/dL
Nitrite: NEGATIVE
Protein, ur: NEGATIVE mg/dL
Specific Gravity, Urine: 1.025 (ref 1.005–1.030)
pH: 6 (ref 5.0–8.0)

## 2023-08-31 MED ORDER — SODIUM CHLORIDE 0.9 % IV BOLUS
1000.0000 mL | Freq: Once | INTRAVENOUS | Status: AC
  Administered 2023-08-31: 1000 mL via INTRAVENOUS

## 2023-08-31 NOTE — Patient Instructions (Signed)
Visit Information  Thank you for taking time to visit with me today. Please don't hesitate to contact me if I can be of assistance to you.   Following are the goals we discussed today:  Continue to take medications as prescribed. Continue to attend provider visits as scheduled Continue to eat healthy, lean meats, vegetables, fruits, avoid saturated and transfats Contact provider with health questions/concerns as needed   Our next appointment is by telephone on 11/5//24 at 1:15 pm  Please call the care guide team at [phone] if you need to cancel or reschedule your appointment.   If you are experiencing a Mental Health or Behavioral Health Crisis or need someone to talk to, please call the Suicide and Crisis Lifeline: 988 call the [place] National Suicide Prevention Lifeline: (515) 776-5635 or TTY: 506-806-2619 TTY (571) 040-5083) to talk to a trained counselor call 1-800-273-TALK (toll free, 24 hour hotline)  Kathyrn Sheriff, RN, MSN, BSN, CCM Care Management Coordinator [phone]

## 2023-08-31 NOTE — Patient Outreach (Signed)
Care Coordination   Follow Up Visit Note   08/31/2023 Name: LAYAH MADSON MRN: [id number] DOB: 1956-01-17  EUFELIA STEPPER is a 67 y.o. year old female who sees [name], MD for primary care. I spoke with  [name] and [name] Murchison(dpr) by phone today.  What matters to the patients health and wellness today?  Admitted 10/7-10/10/24. Ms. Hopkins reports she has had a couple episodes of diarrhea. RNCM reviewed medications with patient including antidiarrheal medication from hospital discharge 08/19/23. She reports she has all her medications. Per [name], patient does not need anything at this time and has all her medications. Ms. Kandis Fantasia expressed her questions have been answered regarding PCS service and patient without any needs at this time.    Goals Addressed             This Visit's Progress    Assistance with Health Management       Interventions Today    Flowsheet Row Most Recent Value  Chronic Disease   Chronic disease during today's visit Other  [multiple myoloma]  General Interventions   General Interventions Discussed/Reviewed General Interventions Reviewed, Doctor Visits  [Evaluation of current treatment plan for health condition and patient's adherence to plan.]  Doctor Visits Discussed/Reviewed Doctor Visits Reviewed  Annabell Sabal upcoming provider appointments]  Education Interventions   Education Provided Provided Education  Provided Verbal Education On Medication, When to see the doctor, Other  [advised to contact provider with health questions/concerns as needed,  take medications as prescribed, attend provider visits as recommended]  Pharmacy Interventions   Pharmacy Dicussed/Reviewed Pharmacy Topics Reviewed  [reviewed medications with patient and Mrs. Murchison from hospital discharge 08/19/23]            SDOH assessments and interventions completed:  No  Care Coordination Interventions:  Yes, provided   Follow up plan: Follow up  call scheduled for 09/14/23    Encounter Outcome:  Patient Visit Completed   Kathyrn Sheriff, RN, MSN, BSN, CCM Care Management Coordinator [phone]

## 2023-08-31 NOTE — ED Triage Notes (Signed)
Reports recurrent diarrhea and weakness with concerns of dehydration. 3-4 diarrhea episode per day. Patient reports when she normally feels like this she receives IV fluids at her oncology office.  Also c/o left shoulder pain x 1 week. Denies known injury. Limited ROM.

## 2023-08-31 NOTE — ED Provider Notes (Signed)
[place] [facility] AT [facility] Provider Note   CSN: [id number] Arrival date & time: 08/31/23  1654     History  Chief Complaint  Patient presents with   Diarrhea   Shoulder Pain    Tami [name] is a 67 y.o. female with MM, h/o SBO, CHF, pacemaker, HTN, HLD, IDA, h/o TIA who presents with recurrent diarrhea for months and generalized weakness with concerns of dehydration. 3-4 diarrhea episode per day. Patient reports when she normally feels like this she receives IV fluids at her oncology office. Has chronic diarrhea and takes nausea medicine at baseline. Often needs IVF and gets it at her oncology office, but by the time she realized she needed some today it was too late to go there, so she came here. Denies any abdominal pain, f/c.  She receives chemotherapy weekly and received her last infusion last Friday.  She states that she has discussed her diarrhea with her oncologist who does not think that it is from the chemotherapy and she is undergoing workup and has appointment with GI in the future to further characterize the diarrhea.  It is nonbloody.   Also c/o left shoulder pain x 1 week. Denies known injury. Limited ROM to lift the arm over her head. No f/c, swelling, erythema. Denies numbness/tingling.  She is using topical creams without much relief.  Past Medical History:  Diagnosis Date   Abnormal SPEP 07/26/2016   Arthritis    knees, hands   Back pain 07/14/2016   Bowel obstruction (HCC) 11/2014   C. 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 Authorizing Provider  acetaminophen (TYLENOL) 500 MG tablet Take 500 mg by mouth every 6 (six) hours as needed.    [provider]  acyclovir (ZOVIRAX) 400 MG tablet Take 1 tablet (400 mg total) by mouth 2 (two) times daily. 04/22/23   Josph Macho, MD  albuterol (PROVENTIL) (2.5 MG/3ML) 0.083% nebulizer solution Take 3 mLs (2.5 mg total) by nebulization every 6 (six) hours as needed for wheezing or shortness of breath. 12/15/22   [name], MD  albuterol (VENTOLIN HFA) 108 (90 Base) MCG/ACT inhaler Inhale 2 puffs into the lungs every 6 (six) hours as needed for wheezing or shortness of breath. 12/07/22   [name], MD  ALPRAZolam Prudy Feeler) 0.25 MG tablet Take 1-2 tablets (0.25-0.5 mg total) by mouth 3 (three) times daily as needed for anxiety. 06/14/23   [name], MD  aspirin EC 81 MG tablet Take 81 mg by mouth daily. Swallow  whole.    [provider]  budesonide (PULMICORT) 0.5 MG/2ML nebulizer solution Take 2 mLs (0.5 mg total) by nebulization 2 (two) times daily as needed (cough, wheezing or shortness of breath). 12/31/22   Martina Sinner, MD  celecoxib (CELEBREX) 100 MG capsule Take 100 mg by mouth 2 (two) times daily. 06/07/23   [provider]  cetirizine (ZYRTEC) 10 MG tablet Take 10 mg by mouth at bedtime.    [provider]  Cholecalciferol (VITAMIN D3) 50 MCG (2000 UT) TABS Take 2,000 Units by mouth in the morning.    [provider]  cholestyramine light (PREVALITE) 4 g  packet Take 4 g by mouth 2 (two) times daily.    [provider]  dicyclomine (BENTYL) 10 MG capsule Take 1 capsule (10 mg total) by mouth 4 (four) times daily -  before meals and at bedtime. Patient taking differently: Take 10 mg by mouth 4 (four) times daily -  before meals and at bedtime. cramping 01/25/23 04/25/23  Judyann Munson, MD  diphenoxylate-atropine (LOMOTIL) 2.5-0.025 MG tablet Take 1 tablet by mouth 2 (two) times daily. 05/19/23   Lynann Bologna, MD  fluticasone (FLONASE) 50 MCG/ACT nasal spray Place 2 sprays into both nostrils daily. 01/07/20   Joy, Shawn C, PA-C  furosemide (LASIX) 20 MG tablet Take 20 mg by mouth daily as needed (fluid retention (feet swelling)). 07/03/20   [provider]  hydrOXYzine (ATARAX) 10 MG tablet TAKE ONE (1) TABLET BY MOUTH 3 TIMES DAILY AS NEEDED FOR ITCHING 03/03/23   [name], MD  hyoscyamine (LEVSIN SL) 0.125 MG SL tablet Place 1 tablet (0.125 mg total) under the tongue every 4 (four) hours as needed. 04/27/23   [name], MD  lidocaine-prilocaine (EMLA) cream Apply 1 Application topically as needed. 07/01/23   Josph Macho, MD  loperamide (IMODIUM) 2 MG capsule Take 4 mg by mouth in the morning and at bedtime.    [provider]  losartan (COZAAR) 50 MG tablet Take 50 mg by mouth in the morning and at bedtime. 04/14/21   [provider]  Magnesium Glycinate 100 MG CAPS Take 200 mg by mouth at bedtime. 09/21/22   [provider]  meclizine (ANTIVERT) 12.5 MG tablet Take 12.5 mg by mouth 2 (two) times daily as needed for dizziness or nausea. 01/28/22   [provider]  metoprolol succinate (TOPROL-XL) 50 MG 24 hr tablet Take 1 tablet (50 mg total) by mouth 2 (two) times daily. Patient taking differently: Take 50 mg by mouth daily. 02/23/23   [name], MD  metroNIDAZOLE (FLAGYL) 500 MG tablet Take 1 tablet (500 mg total) by mouth 3 (three) times daily. 08/25/23   Josph Macho, MD   montelukast (SINGULAIR) 10 MG tablet Take 1 tablet (10 mg total) by mouth at bedtime. Please start taking this on September 4. 07/01/23   Josph Macho, MD  Multiple Vitamins-Minerals (CENTRUM SILVER WOMEN 50+ PO) Take 1 tablet by mouth daily at 6 (six) AM.    [provider]  neomycin-polymyxin-hydrocortisone (CORTISPORIN) OTIC solution Place 4 drops into both ears 4 (four) times daily. 07/30/23   Josph Macho, MD  nortriptyline (PAMELOR) 10 MG capsule Take 1 capsule (10 mg total) by mouth at bedtime. 06/14/23   [name], MD  ondansetron (ZOFRAN) 8 MG tablet Take 1 tablet (8 mg total) by mouth every 8 (eight) hours as needed for nausea or vomiting. 07/14/23   Arlan Organ  R, MD  oxyCODONE-acetaminophen (PERCOCET) 5-325 MG tablet Take 2 tablets by mouth every 6 (six) hours as needed. 08/07/23   Charlynne Pander, MD  oxyCODONE-acetaminophen (PERCOCET/ROXICET) 5-325 MG tablet Take 1 tablet by mouth every 6 (six) hours as needed for severe pain. 11/07/21   [provider]  pantoprazole (PROTONIX) 40 MG tablet Take 1 tablet (40 mg total) by mouth daily. 09/03/23   Sandford Craze, NP  potassium chloride SA (KLOR-CON M) 20 MEQ tablet Take 1 tablet (20 mEq total) by mouth daily. 01/15/23   [name], MD  prochlorperazine (COMPAZINE) 10 MG tablet Take 0.5 tablets (5 mg total) by mouth 2 (two) times daily as needed for nausea or vomiting. 03/16/23   [name], MD  Saccharomyces boulardii (FLORASTOR PO) Take 500 mg by mouth daily at 6 (six) AM.    [provider]  scopolamine (TRANSDERM-SCOP) 1 MG/3DAYS Place 1 patch (1.5 mg total) onto the skin every 3 (three) days. 06/14/23   [name], MD  scopolamine (TRANSDERM-SCOP) 1 MG/3DAYS Place 1 patch (1.5 mg total) onto the skin every 3 (three) days. 07/01/23   Josph Macho, MD  sertraline (ZOLOFT) 50 MG tablet Take 1 tablet (50 mg total) by mouth daily. 06/14/23   [name], MD  spironolactone  (ALDACTONE) 25 MG tablet Take 25 mg by mouth daily.    [provider]      Allergies    Benazepril, Ondansetron hcl, Codeine, and Morphine    Review of Systems   Review of Systems A 10 point review of systems was performed and is negative unless otherwise reported in HPI.  Physical Exam Updated Vital Signs BP (!) 162/98   Pulse 90   Temp 98.1 F (36.7 C) (Oral)   Resp 17   Ht 5\' 6"  (1.676 m)   Wt 86 kg   SpO2 95%   BMI 30.60 kg/m  Physical Exam General: Normal appearing female, lying in bed.  HEENT: Sclera anicteric, MMM, trachea midline.  Cardiology: RRR, no murmurs/rubs/gallops.  Resp: Normal respiratory rate and effort. CTAB, no wheezes, rhonchi, crackles.  Abd: Soft, non-tender, non-distended. No rebound tenderness or guarding.  GU: Deferred. MSK: TTP at L [facility] joint and superior shoulder. No deformities. Limited ROM above 90 degrees. Intact BL radial pulses. Compartments soft, nontender.  No peripheral edema or signs of trauma.  Skin: warm, dry.  Back: No CVA tenderness Neuro: A&Ox4, CNs II-XII grossly intact. MAEs. Sensation grossly intact.  Psych: Normal mood and affect.   ED Results / Procedures / Treatments   Labs (all labs ordered are listed, but only abnormal results are displayed) Labs Reviewed  URINALYSIS, ROUTINE W REFLEX MICROSCOPIC - Abnormal; Notable for the following components:      Result Value   Leukocytes,Ua TRACE (*)    All other components within normal limits  CBC WITH DIFFERENTIAL/PLATELET - Abnormal; Notable for the following components:   WBC 3.5 (*)    Hemoglobin 11.8 (*)    HCT 35.9 (*)    Platelets 111 (*)    nRBC 1.2 (*)    All other components within normal limits  COMPREHENSIVE METABOLIC PANEL - Abnormal; Notable for the following components:   Glucose, Bld 127 (*)    Total Protein 8.5 (*)    All other components within normal limits  URINALYSIS, MICROSCOPIC (REFLEX) - Abnormal; Notable for the following components:    Bacteria, UA FEW (*)    All other components within normal limits  EKG None  Radiology No results found.  Procedures Procedures    Medications Ordered in ED Medications  sodium chloride 0.9 % bolus 1,000 mL (0 mLs Intravenous Stopped 08/31/23 2149)    ED Course/ Medical Decision Making/ A&P                          Medical Decision Making Amount and/or Complexity of Data Reviewed Labs: ordered. Decision-making details documented in ED Course. Radiology: ordered. Decision-making details documented in ED Course.    This patient presents to the ED for concern of chronic diarrhea and left shoulder pain, this involves an extensive number of treatment options, and is a complaint that carries with it a high risk of complications and morbidity.  I considered the following differential and admission for this acute, potentially life threatening condition.   MDM:    For patient's shoulder pain she has tenderness to palpation at the [facility] joint and difficulty abducting the shoulder above 90 degrees.  This is consistent with AC joint separation, arthritis, or rotator cuff injury.  Patient has had no trauma to the shoulder to indicate a fracture or dislocation.  Shoulder x-ray demonstrates AC joint arthritis.  I recommended Tylenol and follow-up with her PCP.  Patient with chronic diarrhea in s/o multiple myeloma and chemotherapy often receiving IV fluids.  She has 3-4 episodes of diarrhea per day and is afebrile, states that this is normal for her but it is inconsistent typically.  She does have a history of C. difficile diarrhea in the past but this does not appear to be consistent with that.  Low concern for C. difficile or other infectious cause of diarrhea and patient is currently undergoing workup for the diarrhea.  She is hemodynamically stable and afebrile, her electrolytes are unremarkable, I do not believe she requires further workup of the diarrhea at this time.  Patient is just  requesting IV fluids.  She states she typically receives 1 L.  After 1 L normal saline here in the ED she reports she feels much improved and would like to be discharged.  Clinical Course as of 09/04/23 4098  Tue Aug 31, 2023  2015 Urinalysis, Routine w reflex microscopic -Urine, Clean Catch(!) Unremarkable in the context of this patient's presentation  [HN]  2016 Comprehensive metabolic panel(!) Unremarkable in the context of this patient's presentation  [HN]  2016 CBC with Differential(!) Unchanged from baseline [HN]  2050 DG Shoulder Left Mild degenerative changes of the acromioclavicular joint. [HN]    Clinical Course User Index [HN] Loetta Rough, MD    Labs: I Ordered, and personally interpreted labs.  The pertinent results include:  those listed above  Imaging Studies ordered: Shoulder XR ordered from triage I independently visualized and interpreted imaging. I agree with the radiologist interpretation  Additional history obtained from chart review  Reevaluation: After the interventions noted above, I reevaluated the patient and found that they have :improved  Social Determinants of Health: Lives independently  Disposition:  DC w/ discharge instructions/return precautions. All questions answered to patient's satisfaction.    Co morbidities that complicate the patient evaluation  Past Medical History:  Diagnosis Date   Abnormal SPEP 07/26/2016   Arthritis    knees, hands   Back pain 07/14/2016   Bowel obstruction (HCC) 11/2014   C. 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   Iron deficiency anemia due to chronic blood loss 05/28/2022   Low back pain  07/14/2016   Monoclonal gammopathy of unknown significance (MGUS) 09/16/2016   Multiple myeloma (HCC) 07/20/2018   Multiple myeloma not having achieved remission (HCC) 07/20/2018   Myalgia 12/31/2016   Obstructive sleep apnea 03/31/2015   Presence of permanent cardiac pacemaker    SOB (shortness of breath) 04/09/2016   Stroke (HCC)    TIAs   UTI (urinary tract infection)      Medicines Meds ordered this encounter  Medications   sodium chloride 0.9 % bolus 1,000 mL    I have reviewed the patients home medicines and have made adjustments as needed  Problem List / ED Course: Problem List Items Addressed This Visit       Digestive   Chronic diarrhea - Primary     Other   Dehydration   Other Visit Diagnoses     Arthritis of left acromioclavicular joint                       This note was created using dictation software, which may contain spelling or grammatical errors.    Loetta Rough, MD 09/04/23 [phone]

## 2023-08-31 NOTE — Discharge Instructions (Signed)
Thank you for coming to [facility] Emergency Department. You were seen for diarrhea, dehydration, and L shoulder pain. 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 You improved with fluids. Please take tylenol 1,000 mg ever 8 hours for pain and apply ice to the left shoulder. You can follow up with your primary care provider within 1 week. Also please follow up with your infectious disease doctor next week, gastroenterologist in November, and oncologist as originally scheduled.  Do not hesitate to return to the ED or call 911 if you experience: -Worsening symptoms -Lightheadedness, passing out -Fevers/chills -Anything else that concerns you

## 2023-09-01 ENCOUNTER — Ambulatory Visit: Payer: 59 | Admitting: Family Medicine

## 2023-09-01 ENCOUNTER — Telehealth: Payer: Self-pay | Admitting: *Deleted

## 2023-09-01 NOTE — Telephone Encounter (Signed)
Message received from patient requesting to come in for IVF's d/t continued diarrhea.  Call placed back to patient and pt states that she is able to eat and drink without difficulty.  Since pt is able to eat and drink without difficulty, pt notified per order of S. [place] PA to increase hydration/fluids and to not come in at this time for hydration d/t NS shortage.  Pt states that she will attempt to increase her fluid intake and is appreciative of call back.

## 2023-09-03 ENCOUNTER — Ambulatory Visit (INDEPENDENT_AMBULATORY_CARE_PROVIDER_SITE_OTHER): Payer: 59 | Admitting: Family

## 2023-09-03 VITALS — BP 147/87 | HR 92 | Temp 97.7°F | Resp 16 | Ht 66.0 in | Wt 190.0 lb

## 2023-09-03 DIAGNOSIS — Z8719 Personal history of other diseases of the digestive system: Secondary | ICD-10-CM | POA: Diagnosis not present

## 2023-09-03 DIAGNOSIS — M25512 Pain in left shoulder: Secondary | ICD-10-CM | POA: Insufficient documentation

## 2023-09-03 DIAGNOSIS — R197 Diarrhea, unspecified: Secondary | ICD-10-CM | POA: Diagnosis not present

## 2023-09-03 DIAGNOSIS — R1013 Epigastric pain: Secondary | ICD-10-CM

## 2023-09-03 DIAGNOSIS — K529 Noninfective gastroenteritis and colitis, unspecified: Secondary | ICD-10-CM | POA: Diagnosis not present

## 2023-09-03 LAB — COMPREHENSIVE METABOLIC PANEL
ALT: 18 U/L (ref 0–35)
AST: 21 U/L (ref 0–37)
Albumin: 3.5 g/dL (ref 3.5–5.2)
Alkaline Phosphatase: 64 U/L (ref 39–117)
BUN: 14 mg/dL (ref 6–23)
CO2: 27 meq/L (ref 19–32)
Calcium: 8.7 mg/dL (ref 8.4–10.5)
Chloride: 107 meq/L (ref 96–112)
Creatinine, Ser: 0.52 mg/dL (ref 0.40–1.20)
GFR: 94.9 mL/min (ref >60.00)
Glucose, Bld: 136 mg/dL — ABNORMAL HIGH (ref 70–99)
Potassium: 3.8 meq/L (ref 3.5–5.1)
Sodium: 142 meq/L (ref 135–145)
Total Bilirubin: 0.5 mg/dL (ref 0.2–1.2)
Total Protein: 6.9 g/dL (ref 6.0–8.3)

## 2023-09-03 LAB — CBC WITH DIFFERENTIAL/PLATELET
Basophils Absolute: 0 10*3/uL (ref 0.0–0.1)
Basophils Relative: 0.7 % (ref 0.0–3.0)
Eosinophils Absolute: 0 10*3/uL (ref 0.0–0.7)
Eosinophils Relative: 1.1 % (ref 0.0–5.0)
HCT: 35.5 % — ABNORMAL LOW (ref 36.0–46.0)
Hemoglobin: 11.2 g/dL — ABNORMAL LOW (ref 12.0–15.0)
Lymphocytes Relative: 25.4 % (ref 12.0–46.0)
Lymphs Abs: 0.6 10*3/uL — ABNORMAL LOW (ref 0.7–4.0)
MCHC: 31.6 g/dL (ref 30.0–36.0)
MCV: 93.1 fL (ref 78.0–100.0)
Monocytes Absolute: 0.3 10*3/uL (ref 0.1–1.0)
Monocytes Relative: 11.4 % (ref 3.0–12.0)
Neutro Abs: 1.6 10*3/uL (ref 1.4–7.7)
Neutrophils Relative %: 61.4 % (ref 43.0–77.0)
Platelets: 118 10*3/uL — ABNORMAL LOW (ref 150.0–400.0)
RBC: 3.81 Mil/uL — ABNORMAL LOW (ref 3.87–5.11)
RDW: 16 % — ABNORMAL HIGH (ref 11.5–15.5)
WBC: 2.5 10*3/uL — ABNORMAL LOW (ref 4.0–10.5)

## 2023-09-03 LAB — LAB REPORT - SCANNED: EGFR: 90

## 2023-09-03 LAB — LIPASE: Lipase: 30 U/L (ref 11.0–59.0)

## 2023-09-03 MED ORDER — PANTOPRAZOLE SODIUM 40 MG PO TBEC: 40.0000 mg | DELAYED_RELEASE_TABLET | Freq: Every day | ORAL | 3 refills | Status: AC

## 2023-09-03 NOTE — Assessment & Plan Note (Signed)
New. Suspect rotator cuff involvement. Refer to sports med. Recommend tylenol and topical voltaren for pain. Avoid NSAIDS due to GI issues.

## 2023-09-03 NOTE — Assessment & Plan Note (Signed)
Work up thus far has included EGD/Colo with unremarkable biopsies.  She is s/p cholecystectomy.  GI profile and C diff testing negative.Limited improvement with anti-diarrheals.    Will arrange follow up with GI. -Pt encouraged to follow through with ID consult.  -Recommend maintaining hydration with water and Gatorade.

## 2023-09-03 NOTE — Patient Instructions (Signed)
VISIT SUMMARY:  During today's visit, we discussed your left shoulder pain, chronic diarrhea, and epigastric pain. We also reviewed your general health maintenance, including your ongoing chemotherapy treatment and the recommendation for a COVID-19 booster shot.  YOUR PLAN:  -LEFT SHOULDER PAIN: Your shoulder pain and limited range of motion may be due to a rotator cuff injury, arthritis, or bursitis. We will refer you to a sports medicine specialist for further evaluation and management, which may include an ultrasound, injection, or physical therapy. In the meantime, you can use over-the-counter Voltaren gel twice daily and continue taking Tylenol as needed for pain relief. Try to avoid anti-inflammatory medications such as ibuprofen due to avoid stomach irritation.   -CHRONIC DIARRHEA: Chronic diarrhea is ongoing loose or watery stools. Your condition has worsened this year and has led to dehydration. You are currently being evaluated by an infectious disease specialist. It is important to maintain hydration by drinking water and Gatorade.   -EPIGASTRIC PAIN: Epigastric pain is discomfort in the upper abdomen, often associated with regurgitation of food. We will conduct an H. pylori breath test and liver and pancreatic function tests. You will also be referred back to your gastroenterologist for further evaluation. An antacid medication will be prescribed, to be started after completing the H. pylori breath test.  -GENERAL HEALTH MAINTENANCE: We recommend you get a COVID-19 booster shot, which should be discussed with your oncologist. Continue with your current chemotherapy regimen as directed by your oncologist.  INSTRUCTIONS:  Please follow up with the sports medicine specialist for your shoulder pain evaluation. Continue your current management plan with the infectious disease specialist for chronic diarrhea. Complete the H. pylori breath test and liver and pancreatic function tests, and follow  up with your gastroenterologist for further evaluation of your epigastric pain. Discuss the COVID-19 booster shot with your oncologist.

## 2023-09-03 NOTE — Progress Notes (Signed)
Subjective:     [name], female    DOB: [birth year], 67 y.o.   MRN: [id number]  Chief Complaint  Patient presents with   Hospitalization Follow-up    Patient here for hospital follow up   Shoulder Pain    Patient having left shoulder pain, unable to lift arm   Diarrhea    Complains of diarrhea, was at ed this am "for fluids"    HPI  Discussed the use of AI scribe software for clinical note transcription with the patient, who gave verbal consent to proceed.  History of Present Illness         The patient, with a history of multiple myeloma and cholecystectomy, presents with left shoulder pain and ongoing diarrhea. The shoulder pain started three weeks ago, without any known injury. The pain is severe enough to limit the patient's range of motion. The patient also reports ongoing diarrhea for over two years, which has worsened within this year. The diarrhea has led to dehydration, requiring the patient to go to the ER twice recently to receive fluids at the ERl. The patient also reports epigastric pain and regurgitation of food.  The patient is currently on chemotherapy for Multiple Myeloma and previously had been in remission for five years.     Health Maintenance Due  Topic Date Due   Pneumonia Vaccine 27+ Years old (4 of 4 - PPSV23 or PCV20) 06/02/2022   COVID-19 Vaccine (6 - 2023-24 season) 07/11/2023    Past Medical History:  Diagnosis Date   Abnormal SPEP 07/26/2016   Arthritis    knees, hands   Back pain 07/14/2016   Bowel obstruction (HCC) 11/2014   C. 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   Multiple myeloma (HCC) 07/20/2018   Multiple myeloma not having achieved remission (HCC) 07/20/2018   Myalgia 12/31/2016   Obstructive sleep apnea 03/31/2015   Presence of permanent cardiac pacemaker    SOB (shortness of breath) 04/09/2016   Stroke (HCC)    TIAs   UTI (urinary tract infection)     Past Surgical History:  Procedure Laterality Date   ABDOMINAL HYSTERECTOMY     menorraghia, 2006, total   BIOPSY  09/01/2022   Procedure: BIOPSY;  Surgeon: Lynann Bologna, MD;  Location: [facility];  Service: Gastroenterology;;   CHOLECYSTECTOMY     COLONOSCOPY  2018   [facility] per patient   COLONOSCOPY WITH PROPOFOL N/A 09/01/2022   Procedure: COLONOSCOPY WITH PROPOFOL;  Surgeon: Lynann Bologna, MD;  Location: [facility];  Service: Gastroenterology;  Laterality: N/A;   ESOPHAGOGASTRODUODENOSCOPY  2018   [facility]    ESOPHAGOGASTRODUODENOSCOPY (EGD) WITH PROPOFOL N/A 09/01/2022   Procedure: ESOPHAGOGASTRODUODENOSCOPY (EGD) WITH PROPOFOL;  Surgeon: Lynann Bologna, MD;  Location: [facility];  Service: Gastroenterology;  Laterality: N/A;   IR IMAGING GUIDED PORT INSERTION  06/29/2023   KNEE SURGERY     right, repair torn torn cartialage   PACEMAKER INSERTION  08/2014   changed last in  2023 [facility] at [facility]   TONSILLECTOMY     TUBAL LIGATION      Family History  Problem Relation Age of Onset   Diabetes Mother    Hypertension Mother    Heart disease Mother        s/p 1 stent   Hyperlipidemia Mother    Arthritis Mother    Cancer Father        COLON   Colon cancer Father 62   Irritable bowel syndrome Sister    Hyperlipidemia Daughter    Hypertension Daughter    Hypertension Maternal Grandmother    Arthritis Maternal Grandmother    Heart disease Maternal Grandfather        MI   Hypertension Maternal Grandfather    Arthritis Maternal  Grandfather    Hypertension Son    Esophageal cancer Neg Hx     Social History   Socioeconomic History   Marital status: Divorced    Spouse name: [name]   Number of children: 3   Years of education: [name]   Highest education level: High school graduate  Occupational History   [name]  Tobacco Use   Smoking status: Never    Passive exposure: Never   Smokeless tobacco: Never  Vaping Use   Vaping status: Never Used  Substance and Sexual Activity   Alcohol use: No   Drug use: No   Sexual activity: Not Currently  Other Topics Concern   [name]  Social History Narrative   Lives at home alone   Retired   Caffeine: coffee   Social Determinants of Health   Financial Resource Strain: Medium Risk (07/08/2023)   Overall Financial Resource Strain (CARDIA)    Difficulty of Paying Living Expenses: Somewhat hard  Food Insecurity: Low Risk  (08/17/2023)   Received from [facility]   Hunger Vital Sign    Worried About Running Out of Food in the Last Year: Never true    Ran Out of Food in the Last Year: Never true  Transportation Needs: No Transportation Needs (08/17/2023)   Received from [facility]    In the past 12 months, has lack of reliable transportation kept you from medical appointments, meetings, work or from getting things needed for daily living? : No  Physical Activity: Inactive (07/08/2023)   Exercise Vital Sign    Days of Exercise per Week: 0 days    Minutes of Exercise per Session: 0 min  Stress: No Stress Concern Present (01/21/2023)   [employer] of Occupational Health - Occupational Stress Questionnaire    Feeling of Stress : Not at all  Social Connections: Moderately Isolated (07/08/2023)   Social Connection and Isolation Panel [NHANES]    Frequency of Communication with Friends and Family: More than three times a week    Frequency of Social Gatherings with Friends and Family: Twice a week    Attends Religious Services:  More than 4 times per year    Active Member of [employer] or Organizations: No    Attends Banker Meetings: Never    Marital Status: Divorced  Catering manager Violence: Not At Risk (03/28/2023)   Humiliation, Afraid, Rape, and Kick questionnaire    Fear of Current or Ex-Partner: No    Emotionally Abused: No    Physically Abused: No    Sexually Abused: No    Outpatient Medications Prior to Visit  Medication Sig Dispense Refill   acetaminophen (TYLENOL) 500 MG  tablet Take 500 mg by mouth every 6 (six) hours as needed.     acyclovir (ZOVIRAX) 400 MG tablet Take 1 tablet (400 mg total) by mouth 2 (two) times daily. 60 tablet 3   albuterol (PROVENTIL) (2.5 MG/3ML) 0.083% nebulizer solution Take 3 mLs (2.5 mg total) by nebulization every 6 (six) hours as needed for wheezing or shortness of breath. 150 mL 2   albuterol (VENTOLIN HFA) 108 (90 Base) MCG/ACT inhaler Inhale 2 puffs into the lungs every 6 (six) hours as needed for wheezing or shortness of breath. 18 g 5   ALPRAZolam (XANAX) 0.25 MG tablet Take 1-2 tablets (0.25-0.5 mg total) by mouth 3 (three) times daily as needed for anxiety. 40 tablet 1   aspirin EC 81 MG tablet Take 81 mg by mouth daily. Swallow whole.     budesonide (PULMICORT) 0.5 MG/2ML nebulizer solution Take 2 mLs (0.5 mg total) by nebulization 2 (two) times daily as needed (cough, wheezing or shortness of breath). 120 mL 11   celecoxib (CELEBREX) 100 MG capsule Take 100 mg by mouth 2 (two) times daily.     cetirizine (ZYRTEC) 10 MG tablet Take 10 mg by mouth at bedtime.     Cholecalciferol (VITAMIN D3) 50 MCG (2000 UT) TABS Take 2,000 Units by mouth in the morning.     cholestyramine light (PREVALITE) 4 g packet Take 4 g by mouth 2 (two) times daily.     diphenoxylate-atropine (LOMOTIL) 2.5-0.025 MG tablet Take 1 tablet by mouth 2 (two) times daily. 60 tablet 6   fluticasone (FLONASE) 50 MCG/ACT nasal spray Place 2 sprays into both nostrils daily. 16 g 0   furosemide  (LASIX) 20 MG tablet Take 20 mg by mouth daily as needed (fluid retention (feet swelling)).     hydrOXYzine (ATARAX) 10 MG tablet TAKE ONE (1) TABLET BY MOUTH 3 TIMES DAILY AS NEEDED FOR ITCHING 30 tablet 2   hyoscyamine (LEVSIN SL) 0.125 MG SL tablet Place 1 tablet (0.125 mg total) under the tongue every 4 (four) hours as needed. 30 tablet 1   lidocaine-prilocaine (EMLA) cream Apply 1 Application topically as needed. 30 g 0   loperamide (IMODIUM) 2 MG capsule Take 4 mg by mouth in the morning and at bedtime.     losartan (COZAAR) 50 MG tablet Take 50 mg by mouth in the morning and at bedtime.     Magnesium Glycinate 100 MG CAPS Take 200 mg by mouth at bedtime.     meclizine (ANTIVERT) 12.5 MG tablet Take 12.5 mg by mouth 2 (two) times daily as needed for dizziness or nausea.     metoprolol succinate (TOPROL-XL) 50 MG 24 hr tablet Take 1 tablet (50 mg total) by mouth 2 (two) times daily. (Patient taking differently: Take 50 mg by mouth daily.) 60 tablet 2   metroNIDAZOLE (FLAGYL) 500 MG tablet Take 1 tablet (500 mg total) by mouth 3 (three) times daily. 30 tablet 2   montelukast (SINGULAIR) 10 MG tablet Take 1 tablet (10 mg total) by mouth at bedtime. Please start taking this on September 4. 30 tablet 3   Multiple Vitamins-Minerals (CENTRUM SILVER WOMEN 50+ PO) Take 1 tablet by mouth daily at 6 (six) AM.     neomycin-polymyxin-hydrocortisone (CORTISPORIN) OTIC solution Place 4 drops into both ears 4 (four) times daily. 10 mL 4   nortriptyline (PAMELOR) 10 MG capsule Take 1 capsule (10 mg total) by mouth at bedtime. 30 capsule 1   ondansetron (ZOFRAN) 8 MG tablet Take  1 tablet (8 mg total) by mouth every 8 (eight) hours as needed for nausea or vomiting. 30 tablet 1   oxyCODONE-acetaminophen (PERCOCET) 5-325 MG tablet Take 2 tablets by mouth every 6 (six) hours as needed. 10 tablet 0   oxyCODONE-acetaminophen (PERCOCET/ROXICET) 5-325 MG tablet Take 1 tablet by mouth every 6 (six) hours as needed for  severe pain.     potassium chloride SA (KLOR-CON M) 20 MEQ tablet Take 1 tablet (20 mEq total) by mouth daily. 90 tablet 1   prochlorperazine (COMPAZINE) 10 MG tablet Take 0.5 tablets (5 mg total) by mouth 2 (two) times daily as needed for nausea or vomiting. 60 tablet 1   Saccharomyces boulardii (FLORASTOR PO) Take 500 mg by mouth daily at 6 (six) AM.     scopolamine (TRANSDERM-SCOP) 1 MG/3DAYS Place 1 patch (1.5 mg total) onto the skin every 3 (three) days. 2 patch 0   scopolamine (TRANSDERM-SCOP) 1 MG/3DAYS Place 1 patch (1.5 mg total) onto the skin every 3 (three) days. 10 patch 1   sertraline (ZOLOFT) 50 MG tablet Take 1 tablet (50 mg total) by mouth daily. 30 tablet 3   spironolactone (ALDACTONE) 25 MG tablet Take 25 mg by mouth daily.     dicyclomine (BENTYL) 10 MG capsule Take 1 capsule (10 mg total) by mouth 4 (four) times daily -  before meals and at bedtime. (Patient taking differently: Take 10 mg by mouth 4 (four) times daily -  before meals and at bedtime. cramping) 120 capsule 2   Facility-Administered Medications Prior to Visit  Medication Dose Route Frequency Provider Last Rate Last Admin   nitroGLYCERIN (NITROSTAT) SL tablet 0.4 mg  0.4 mg Sublingual Once [name], MD        Allergies  Allergen Reactions   Benazepril Anaphylaxis, Swelling and Hives    angioedema Throat and lip swelling   Ondansetron Hcl Hives    Redness and hives post IV admin on 07/05/17   Codeine Nausea And Vomiting   Morphine Hives    Redness and hives noted post IV admin on 07/05/17    ROS See HPI    Objective:    Physical Exam Constitutional:      General: She is not in acute distress.    Appearance: Normal appearance. She is well-developed.  HENT:     Head: Normocephalic and atraumatic.     Right Ear: External ear normal.     Left Ear: External ear normal.  Eyes:     General: No scleral icterus. Neck:     Thyroid: No thyromegaly.  Cardiovascular:     Rate and Rhythm: Normal  rate and regular rhythm.     Heart sounds: Normal heart sounds. No murmur heard. Pulmonary:     Effort: Pulmonary effort is normal. No respiratory distress.     Breath sounds: Normal breath sounds. No wheezing.  Musculoskeletal:     Cervical back: Neck supple.     Comments: Left shoulder tenderness to palpation without swelling + pain with left sided empty can test. Only able to abduct left arm about 30 degrees  Skin:    General: Skin is warm and dry.  Neurological:     Mental Status: She is alert and oriented to person, place, and time.  Psychiatric:        Mood and Affect: Mood normal.        Behavior: Behavior normal.        Thought Content: Thought content normal.  Judgment: Judgment normal.      BP (!) 147/87 (BP Location: Right Arm, Patient Position: Sitting, Cuff Size: Normal)   Pulse 92   Temp 97.7 F (36.5 C) (Oral)   Resp 16   Ht 5\' 6"  (1.676 m)   Wt 190 lb (86.2 kg)   SpO2 98%   BMI 30.67 kg/m  Wt Readings from Last 3 Encounters:  09/03/23 190 lb (86.2 kg)  08/31/23 189 lb 9.5 oz (86 kg)  08/20/23 189 lb (85.7 kg)       Assessment & Plan:   Problem List Items Addressed This Visit       Unprioritized   Chronic diarrhea    Work up thus far has included EGD/Colo with unremarkable biopsies.  She is s/p cholecystectomy.  GI profile and C diff testing negative.Limited improvement with anti-diarrheals.    Will arrange follow up with GI. -Pt encouraged to follow through with ID consult.  -Recommend maintaining hydration with water and Gatorade.      AP (abdominal pain) - Primary    Seems to be worsening.   Pain in the epigastric region with regurgitation of food. History of reflux changes in the lower esophagus. No current antacid medication. -Order H. pylori breath test and liver and pancreatic function tests. -Refer back to gastroenterologist for further evaluation. -Prescribe PPI, to be started after completion of H. pylori breath test.       Relevant Medications   pantoprazole (PROTONIX) 40 MG tablet   Other Relevant Orders   H. pylori breath test   Ambulatory referral to Gastroenterology   Comp Met (CMET)   CBC w/Diff   Lipase   Acute pain of left shoulder    New. Suspect rotator cuff involvement. Refer to sports med. Recommend tylenol and topical voltaren for pain. Avoid NSAIDS due to GI issues.       Relevant Orders   Ambulatory referral to Sports Medicine    I am having Elease Hashimoto A. Lipton start on pantoprazole. I am also having her maintain her fluticasone, furosemide, losartan, cetirizine, Vitamin D3, Magnesium Glycinate, albuterol, albuterol, budesonide, potassium chloride SA, dicyclomine, meclizine, oxyCODONE-acetaminophen, metoprolol succinate, aspirin EC, loperamide, spironolactone, hydrOXYzine, prochlorperazine, cholestyramine light, acyclovir, hyoscyamine, diphenoxylate-atropine, ALPRAZolam, nortriptyline, sertraline, scopolamine, celecoxib, lidocaine-prilocaine, scopolamine, montelukast, Saccharomyces boulardii (FLORASTOR PO), Multiple Vitamins-Minerals (CENTRUM SILVER WOMEN 50+ PO), ondansetron, acetaminophen, neomycin-polymyxin-hydrocortisone, oxyCODONE-acetaminophen, and metroNIDAZOLE. We will continue to administer nitroGLYCERIN.  Meds ordered this encounter  Medications   pantoprazole (PROTONIX) 40 MG tablet    Sig: Take 1 tablet (40 mg total) by mouth daily.    Dispense:  30 tablet    Refill:  3    Order Specific Question:   Supervising Provider    Answer:   Danise Edge A [[id number]]

## 2023-09-03 NOTE — Assessment & Plan Note (Signed)
Seems to be worsening.   Pain in the epigastric region with regurgitation of food. History of reflux changes in the lower esophagus. No current antacid medication. -Order H. pylori breath test and liver and pancreatic function tests. -Refer back to gastroenterologist for further evaluation. -Prescribe PPI, to be started after completion of H. pylori breath test.

## 2023-09-04 ENCOUNTER — Other Ambulatory Visit: Payer: Self-pay

## 2023-09-07 ENCOUNTER — Ambulatory Visit: Payer: 59 | Admitting: Sports Medicine

## 2023-09-07 ENCOUNTER — Encounter: Payer: Self-pay | Admitting: Sports Medicine

## 2023-09-07 ENCOUNTER — Ambulatory Visit (INDEPENDENT_AMBULATORY_CARE_PROVIDER_SITE_OTHER): Payer: 59 | Admitting: Sports Medicine

## 2023-09-07 VITALS — BP 120/80 | Ht 66.0 in | Wt 190.0 lb

## 2023-09-07 DIAGNOSIS — M7582 Other shoulder lesions, left shoulder: Secondary | ICD-10-CM

## 2023-09-07 MED ORDER — METHYLPREDNISOLONE ACETATE 40 MG/ML IJ SUSP
40.0000 mg | Freq: Once | INTRAMUSCULAR | Status: AC
  Administered 2023-09-07: 40 mg via INTRA_ARTICULAR

## 2023-09-07 NOTE — Progress Notes (Addendum)
Subjective:    [name], female    DOB: 1956/10/24, 67 y.o.   MRN: [id number]  HPI chief complaint: Left shoulder pain  Patient is a 67 year old right-hand-dominant female who presents today with 3 weeks of lateral left shoulder pain.  She denies any trauma.  She has difficulty with any sort of shoulder motion, particularly reaching out away from her body or overhead.  She endorses pain at night.  She is takingTylenol but it is not helping.  She denies any similar problems in the past.  No prior shoulder surgeries.  X-rays of her left shoulder done on October 22 showed some mild AC DJD but otherwise unremarkable.  Past medical history reviewed Medications reviewed Allergies reviewed  Review of Systems As above    Objective:   Physical Exam  Well-developed, well-nourished.  No acute distress  Left shoulder: Limited active range of motion in all planes.  Good passive external rotation.  Slight tenderness to palpation over the [facility] joint.  Positive empty can and positive [place].  Neurovascularly intact distally.  X-rays are as above      Assessment & Plan:   Left shoulder pain likely secondary to rotator cuff tendinitis/subacromial bursitis  We discussed treatment today with oral NSAIDs versus subacromial cortisone injection.  Patient elected the subacromial cortisone injection.  We will also have her do some physical therapy and she can wean to a home exercise program per the therapist discretion.  Follow-up for ongoing or recalcitrant issues.  Consent obtained and verified. Time-out conducted. Noted no overlying erythema, induration, or other signs of local infection. Skin prepped in a sterile fashion. Topical analgesic spray: Ethyl chloride. Joint: Left shoulder, subacromial Needle: 25-gauge 1.5 inch Completed without difficulty. Meds: 3 cc 1% Xylocaine, 1 cc (40 mg) Depo-Medrol  This note was dictated using Dragon naturally speaking software and may  contain errors in syntax, spelling, or content which have not been identified prior to signing this note.

## 2023-09-08 DIAGNOSIS — G4733 Obstructive sleep apnea (adult) (pediatric): Secondary | ICD-10-CM | POA: Diagnosis not present

## 2023-09-08 DIAGNOSIS — S93401A Sprain of unspecified ligament of right ankle, initial encounter: Secondary | ICD-10-CM | POA: Diagnosis not present

## 2023-09-09 ENCOUNTER — Ambulatory Visit: Payer: 59 | Admitting: Internal Medicine

## 2023-09-09 ENCOUNTER — Other Ambulatory Visit: Payer: Self-pay

## 2023-09-09 VITALS — BP 144/87 | HR 82 | Temp 97.8°F | Wt 190.0 lb

## 2023-09-09 DIAGNOSIS — K580 Irritable bowel syndrome with diarrhea: Secondary | ICD-10-CM

## 2023-09-09 LAB — H. PYLORI BREATH TEST: H. pylori Breath Test: NOT DETECTED

## 2023-09-09 MED ORDER — DICYCLOMINE HCL 10 MG PO CAPS: 10.0000 mg | ORAL_CAPSULE | Freq: Three times a day (TID) | ORAL | 1 refills | Status: DC

## 2023-09-09 NOTE — Progress Notes (Signed)
[name], female   DOB: 1956-05-04, 67 y.o.   MRN: [id number]  HPI Tami [name] said that she had been doing okay up until this late summer and fall after returning from a cruise to [place]. When she returned from the cruise, she noticed that she started to have more diarrhea. She had to go to ED for dehydration x 3. She is now taking 3 lomotil per day and 2 immodium a day still having some watery diarrhea. No blood. Occasional abdominal cramping. She reports  Going 3 or 4 times per day. Before the cruise, she was only going 2 times per day.  Staying hydrated.  Outpatient Encounter Medications as of 09/09/2023  Medication Sig   acetaminophen (TYLENOL) 500 MG tablet Take 500 mg by mouth every 6 (six) hours as needed.   acyclovir (ZOVIRAX) 400 MG tablet Take 1 tablet (400 mg total) by mouth 2 (two) times daily.   albuterol (PROVENTIL) (2.5 MG/3ML) 0.083% nebulizer solution Take 3 mLs (2.5 mg total) by nebulization every 6 (six) hours as needed for wheezing or shortness of breath.   albuterol (VENTOLIN HFA) 108 (90 Base) MCG/ACT inhaler Inhale 2 puffs into the lungs every 6 (six) hours as needed for wheezing or shortness of breath.   ALPRAZolam (XANAX) 0.25 MG tablet Take 1-2 tablets (0.25-0.5 mg total) by mouth 3 (three) times daily as needed for anxiety.   aspirin EC 81 MG tablet Take 81 mg by mouth daily. Swallow whole.   budesonide (PULMICORT) 0.5 MG/2ML nebulizer solution Take 2 mLs (0.5 mg total) by nebulization 2 (two) times daily as needed (cough, wheezing or shortness of breath).   celecoxib (CELEBREX) 100 MG capsule Take 100 mg by mouth 2 (two) times daily.   cetirizine (ZYRTEC) 10 MG tablet Take 10 mg by mouth at bedtime.   Cholecalciferol (VITAMIN D3) 50 MCG (2000 UT) TABS Take 2,000 Units by mouth in the morning.   cholestyramine light (PREVALITE) 4 g packet Take 4 g by mouth 2 (two) times daily.   diphenoxylate-atropine (LOMOTIL) 2.5-0.025 MG tablet Take 1  tablet by mouth 2 (two) times daily.   fluticasone (FLONASE) 50 MCG/ACT nasal spray Place 2 sprays into both nostrils daily.   furosemide (LASIX) 20 MG tablet Take 20 mg by mouth daily as needed (fluid retention (feet swelling)).   hydrOXYzine (ATARAX) 10 MG tablet TAKE ONE (1) TABLET BY MOUTH 3 TIMES DAILY AS NEEDED FOR ITCHING   hyoscyamine (LEVSIN SL) 0.125 MG SL tablet Place 1 tablet (0.125 mg total) under the tongue every 4 (four) hours as needed.   lidocaine-prilocaine (EMLA) cream Apply 1 Application topically as needed.   loperamide (IMODIUM) 2 MG capsule Take 4 mg by mouth in the morning and at bedtime.   losartan (COZAAR) 50 MG tablet Take 50 mg by mouth in the morning and at bedtime.   Magnesium Glycinate 100 MG CAPS Take 200 mg by mouth at bedtime.   meclizine (ANTIVERT) 12.5 MG tablet Take 12.5 mg by mouth 2 (two) times daily as needed for dizziness or nausea.   metoprolol succinate (TOPROL-XL) 50 MG 24 hr tablet Take 1 tablet (50 mg total) by mouth 2 (two) times daily. (Patient taking differently: Take 50 mg by mouth daily.)   metroNIDAZOLE (FLAGYL) 500 MG tablet Take 1 tablet (500 mg total) by mouth 3 (three) times daily.   montelukast (SINGULAIR) 10 MG tablet Take 1 tablet (10 mg total) by mouth at bedtime. Please start taking this on September  4.   Multiple Vitamins-Minerals (CENTRUM SILVER WOMEN 50+ PO) Take 1 tablet by mouth daily at 6 (six) AM.   neomycin-polymyxin-hydrocortisone (CORTISPORIN) OTIC solution Place 4 drops into both ears 4 (four) times daily.   nortriptyline (PAMELOR) 10 MG capsule Take 1 capsule (10 mg total) by mouth at bedtime.   ondansetron (ZOFRAN) 8 MG tablet Take 1 tablet (8 mg total) by mouth every 8 (eight) hours as needed for nausea or vomiting.   oxyCODONE-acetaminophen (PERCOCET) 5-325 MG tablet Take 2 tablets by mouth every 6 (six) hours as needed.   oxyCODONE-acetaminophen (PERCOCET/ROXICET) 5-325 MG tablet Take 1 tablet by mouth every 6 (six) hours  as needed for severe pain.   pantoprazole (PROTONIX) 40 MG tablet Take 1 tablet (40 mg total) by mouth daily.   potassium chloride SA (KLOR-CON M) 20 MEQ tablet Take 1 tablet (20 mEq total) by mouth daily.   prochlorperazine (COMPAZINE) 10 MG tablet Take 0.5 tablets (5 mg total) by mouth 2 (two) times daily as needed for nausea or vomiting.   Saccharomyces boulardii (FLORASTOR PO) Take 500 mg by mouth daily at 6 (six) AM.   scopolamine (TRANSDERM-SCOP) 1 MG/3DAYS Place 1 patch (1.5 mg total) onto the skin every 3 (three) days.   scopolamine (TRANSDERM-SCOP) 1 MG/3DAYS Place 1 patch (1.5 mg total) onto the skin every 3 (three) days.   sertraline (ZOLOFT) 50 MG tablet Take 1 tablet (50 mg total) by mouth daily.   spironolactone (ALDACTONE) 25 MG tablet Take 25 mg by mouth daily.   dicyclomine (BENTYL) 10 MG capsule Take 1 capsule (10 mg total) by mouth 4 (four) times daily -  before meals and at bedtime. (Patient taking differently: Take 10 mg by mouth 4 (four) times daily -  before meals and at bedtime. cramping)   Facility-Administered Encounter Medications as of 09/09/2023  Medication   nitroGLYCERIN (NITROSTAT) SL tablet 0.4 mg     Patient Active Problem List   Diagnosis Date Noted   Acute pain of left shoulder 09/03/2023   Chronic combined systolic and diastolic congestive heart failure (HCC) 03/28/2023   Intractable nausea and vomiting 03/27/2023   Asthma 12/20/2022   Diarrhea    Hypocalcemia 08/25/2022   Iron deficiency anemia due to chronic blood loss 05/28/2022   Chronic diarrhea 05/27/2022   Soft tissue lesion 04/28/2022   Obesity (BMI 30-39.9) 04/28/2022   SBO (small bowel obstruction) (HCC) 04/27/2022   Dehydration 04/03/2022   Acute vaginitis 03/18/2022   Cervical cancer screening 03/18/2022   Dizziness 12/11/2021   Visual changes 11/26/2021   Ear fullness, bilateral 08/12/2021   Bilateral knee pain 08/08/2021   History of 2019 novel coronavirus disease (COVID-19)  12/06/2020   Vitamin deficiency 07/31/2020   Acute combined systolic and diastolic heart failure (HCC) 05/09/2020   Hypomagnesemia 03/04/2020   Thrombocytopenia (HCC) 03/04/2020   Dysphagia 03/04/2020   Constipation 03/04/2020   Sensation of pressure in bladder area 01/17/2020   Hyperglycemia 01/17/2020   Peripheral neuropathy 11/15/2019   Chronic migraine without aura, with intractable migraine, so stated, with status migrainosus 10/17/2019   ETD (Eustachian tube dysfunction), bilateral 09/12/2019   Urinary frequency 07/17/2019   Headache 07/17/2019   Palpitation 05/15/2019   Clostridium difficile diarrhea 09/04/2018   Thyroid nodule 08/26/2018   Multiple myeloma (HCC) 07/20/2018   Multiple myeloma not having achieved remission (HCC) 07/20/2018   Hematuria 07/11/2018   Seasonal allergies 06/23/2018   Right shoulder pain 06/07/2018   Neck pain 06/07/2018   Paresthesias 06/07/2018  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   Pain in thoracic spine 03/18/2018   Thoracic back pain 02/15/2018   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   Hypokalemia 08/20/2017   Anemia 08/20/2017   Elevated sed rate 08/15/2017   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   Epigastric abdominal pain 03/22/2017   Myalgia 12/31/2016   Abnormal SPEP 07/26/2016   Low back pain 07/14/2016   Insomnia 12/29/2015   Tension headache 10/21/2015   Muscle spasms of neck 10/18/2015   Dysuria 10/13/2015   AP (abdominal pain) 07/07/2015   Cardiomyopathy (HCC) 04/19/2015   Chest pain 04/19/2015   Obstructive sleep apnea 03/31/2015   Congestive heart failure (HCC) 02/24/2015   Arthritis    Hyperlipidemia    Hypertension    Depression    Stroke (HCC)    GERD (gastroesophageal reflux disease)    Bowel obstruction (HCC) 11/09/2014     Health Maintenance Due  Topic Date Due   Pneumonia Vaccine 53+ Years old (4 of 4 - PPSV23 or PCV20) 06/02/2022   COVID-19 Vaccine (6 - 2023-24 season)  07/11/2023     Review of Systems 12 point ros is negative except what is mentioned above Physical Exam   BP (!) 144/87   Pulse 82   Temp 97.8 F (36.6 C) (Oral)   Wt 190 lb (86.2 kg)   SpO2 97%   BMI 30.67 kg/m   Physical Exam  Constitutional:  oriented to person, place, and time. appears well-developed and well-nourished. No distress.  HENT: [place]/AT, PERRLA, no scleral icterus Mouth/Throat: Oropharynx is clear and moist. No oropharyngeal exudate.  Cardiovascular: Normal rate, regular rhythm and normal heart sounds. Exam reveals no gallop and no friction rub.  No murmur heard.  Pulmonary/Chest: Effort normal and breath sounds normal. No respiratory distress.  has no wheezes.  Neck = supple, no nuchal rigidity Abdominal: Soft. Bowel sounds are normal.  exhibits no distension. There is no tenderness.  Lymphadenopathy: no cervical adenopathy. No axillary adenopathy Neurological: alert and oriented to person, place, and time.  Skin: Skin is warm and dry. No rash noted. No erythema.  Psychiatric: a normal mood and affect.  behavior is normal.   Lab Results  Component Value Date   LABRPR NON REACTIVE 08/23/2020    CBC Lab Results  Component Value Date   WBC 2.5 (L) 09/03/2023   RBC 3.81 (L) 09/03/2023   HGB 11.2 (L) 09/03/2023   HCT 35.5 (L) 09/03/2023   PLT 118.0 (L) 09/03/2023   MCV 93.1 09/03/2023   MCH 29.8 08/31/2023   MCHC 31.6 09/03/2023   RDW 16.0 (H) 09/03/2023   LYMPHSABS 0.6 (L) 09/03/2023   MONOABS 0.3 09/03/2023   EOSABS 0.0 09/03/2023    BMET Lab Results  Component Value Date   NA 142 09/03/2023   K 3.8 09/03/2023   CL 107 09/03/2023   CO2 27 09/03/2023   GLUCOSE 136 (H) 09/03/2023   BUN 14 09/03/2023   CREATININE 0.52 09/03/2023   CALCIUM 8.7 09/03/2023   GFRNONAA >60 08/31/2023   GFRAA >60 08/10/2020      Assessment and Plan Diarrhea = she has had cdiff ruled out. And plan to continue with lomitil +/- imodium. Recommend to follow up with  dr Chales Abrahams for IBS-diarrhea Gave rx bentyl for cramping Consider adding fiber.

## 2023-09-10 ENCOUNTER — Other Ambulatory Visit: Payer: Self-pay

## 2023-09-10 ENCOUNTER — Encounter: Payer: Self-pay | Admitting: Hematology & Oncology

## 2023-09-10 ENCOUNTER — Inpatient Hospital Stay: Payer: 59 | Attending: Hematology & Oncology

## 2023-09-10 ENCOUNTER — Inpatient Hospital Stay: Payer: 59

## 2023-09-10 ENCOUNTER — Inpatient Hospital Stay (HOSPITAL_BASED_OUTPATIENT_CLINIC_OR_DEPARTMENT_OTHER): Payer: 59 | Admitting: Hematology & Oncology

## 2023-09-10 VITALS — BP 149/78 | HR 88

## 2023-09-10 VITALS — BP 141/78 | HR 93 | Temp 98.6°F | Resp 18 | Ht 66.0 in | Wt 191.0 lb

## 2023-09-10 DIAGNOSIS — C9000 Multiple myeloma not having achieved remission: Secondary | ICD-10-CM

## 2023-09-10 DIAGNOSIS — Z5112 Encounter for antineoplastic immunotherapy: Secondary | ICD-10-CM | POA: Insufficient documentation

## 2023-09-10 DIAGNOSIS — C9002 Multiple myeloma in relapse: Secondary | ICD-10-CM | POA: Diagnosis not present

## 2023-09-10 DIAGNOSIS — Z79899 Other long term (current) drug therapy: Secondary | ICD-10-CM | POA: Insufficient documentation

## 2023-09-10 DIAGNOSIS — Z5111 Encounter for antineoplastic chemotherapy: Secondary | ICD-10-CM | POA: Diagnosis not present

## 2023-09-10 DIAGNOSIS — Z79620 Long term (current) use of immunosuppressive biologic: Secondary | ICD-10-CM | POA: Insufficient documentation

## 2023-09-10 DIAGNOSIS — R197 Diarrhea, unspecified: Secondary | ICD-10-CM | POA: Insufficient documentation

## 2023-09-10 LAB — CBC WITH DIFFERENTIAL (CANCER CENTER ONLY)
Abs Immature Granulocytes: 0.01 10*3/uL (ref 0.00–0.07)
Basophils Absolute: 0 10*3/uL (ref 0.0–0.1)
Basophils Relative: 1 %
Eosinophils Absolute: 0.1 10*3/uL (ref 0.0–0.5)
Eosinophils Relative: 2 %
HCT: 33.8 % — ABNORMAL LOW (ref 36.0–46.0)
Hemoglobin: 10.9 g/dL — ABNORMAL LOW (ref 12.0–15.0)
Immature Granulocytes: 0 %
Lymphocytes Relative: 22 %
Lymphs Abs: 0.7 10*3/uL (ref 0.7–4.0)
MCH: 29.6 pg (ref 26.0–34.0)
MCHC: 32.2 g/dL (ref 30.0–36.0)
MCV: 91.8 fL (ref 80.0–100.0)
Monocytes Absolute: 0.3 10*3/uL (ref 0.1–1.0)
Monocytes Relative: 9 %
Neutro Abs: 2.2 10*3/uL (ref 1.7–7.7)
Neutrophils Relative %: 66 %
Platelet Count: 162 10*3/uL (ref 150–400)
RBC: 3.68 MIL/uL — ABNORMAL LOW (ref 3.87–5.11)
RDW: 14.5 % (ref 11.5–15.5)
WBC Count: 3.4 10*3/uL — ABNORMAL LOW (ref 4.0–10.5)
nRBC: 0 % (ref 0.0–0.2)

## 2023-09-10 LAB — CMP (CANCER CENTER ONLY)
ALT: 13 U/L (ref 0–44)
AST: 20 U/L (ref 15–41)
Albumin: 4 g/dL (ref 3.5–5.0)
Alkaline Phosphatase: 52 U/L (ref 38–126)
Anion gap: 8 (ref 5–15)
BUN: 17 mg/dL (ref 8–23)
CO2: 26 mmol/L (ref 22–32)
Calcium: 9.4 mg/dL (ref 8.9–10.3)
Chloride: 107 mmol/L (ref 98–111)
Creatinine: 0.63 mg/dL (ref 0.44–1.00)
GFR, Estimated: >60 mL/min (ref >60)
Glucose, Bld: 136 mg/dL — ABNORMAL HIGH (ref 70–99)
Potassium: 3.4 mmol/L — ABNORMAL LOW (ref 3.5–5.1)
Sodium: 141 mmol/L (ref 135–145)
Total Bilirubin: 0.5 mg/dL (ref 0.3–1.2)
Total Protein: 7.7 g/dL (ref 6.5–8.1)

## 2023-09-10 LAB — LACTATE DEHYDROGENASE: LDH: 195 U/L — ABNORMAL HIGH (ref 98–192)

## 2023-09-10 MED ORDER — SODIUM CHLORIDE 0.9% FLUSH
10.0000 mL | INTRAVENOUS | Status: DC | PRN
  Administered 2023-09-10: 10 mL

## 2023-09-10 MED ORDER — CARFILZOMIB CHEMO INJECTION 60 MG
56.0000 mg/m2 | Freq: Once | INTRAVENOUS | Status: AC
  Administered 2023-09-10: 110 mg via INTRAVENOUS
  Filled 2023-09-10: qty 30

## 2023-09-10 MED ORDER — SODIUM CHLORIDE 0.9 % IV SOLN
20.0000 mg | Freq: Once | INTRAVENOUS | Status: AC
  Administered 2023-09-10: 20 mg via INTRAVENOUS
  Filled 2023-09-10: qty 20

## 2023-09-10 MED ORDER — SODIUM CHLORIDE 0.9 % IV SOLN
Freq: Once | INTRAVENOUS | Status: AC
Start: 2023-09-10 — End: 2023-09-10

## 2023-09-10 MED ORDER — SODIUM CHLORIDE 0.9 % IV SOLN
10.0000 mg/kg | Freq: Once | INTRAVENOUS | Status: AC
  Administered 2023-09-10: 900 mg via INTRAVENOUS
  Filled 2023-09-10: qty 25

## 2023-09-10 MED ORDER — ACETAMINOPHEN 325 MG PO TABS
650.0000 mg | ORAL_TABLET | Freq: Once | ORAL | Status: DC
Start: 2023-09-10 — End: 2023-09-10

## 2023-09-10 MED ORDER — DIPHENHYDRAMINE HCL 50 MG/ML IJ SOLN
50.0000 mg | Freq: Once | INTRAMUSCULAR | Status: AC
  Administered 2023-09-10: 50 mg via INTRAVENOUS
  Filled 2023-09-10: qty 1

## 2023-09-10 MED ORDER — SODIUM CHLORIDE 0.9 % IV SOLN: Freq: Once | INTRAVENOUS | Status: AC

## 2023-09-10 MED ORDER — SODIUM CHLORIDE 0.9 % IV SOLN
300.0000 mg/m2 | Freq: Once | INTRAVENOUS | Status: AC
  Administered 2023-09-10: 600 mg via INTRAVENOUS
  Filled 2023-09-10: qty 30

## 2023-09-10 MED ORDER — FAMOTIDINE IN NACL 20-0.9 MG/50ML-% IV SOLN
20.0000 mg | Freq: Once | INTRAVENOUS | Status: AC
  Administered 2023-09-10: 20 mg via INTRAVENOUS
  Filled 2023-09-10: qty 50

## 2023-09-10 MED ORDER — HEPARIN SOD (PORK) LOCK FLUSH 100 UNIT/ML IV SOLN
500.0000 [IU] | Freq: Once | INTRAVENOUS | Status: AC | PRN
  Administered 2023-09-10: 500 [IU]

## 2023-09-10 NOTE — Patient Instructions (Signed)

## 2023-09-10 NOTE — Patient Instructions (Signed)
[facility]  Discharge Instructions: Thank you for choosing [facility] to provide your oncology and hematology care.   If you have a lab appointment with the Cancer Center, please go directly to the Cancer Center and check in at the registration area.  Wear comfortable clothing and clothing appropriate for easy access to any Portacath or PICC line.   We strive to give you quality time with your provider. You may need to reschedule your appointment if you arrive late (15 or more minutes).  Arriving late affects you and other patients whose appointments are after yours.  Also, if you miss three or more appointments without notifying the office, you may be dismissed from the clinic at the provider's discretion.      For prescription refill requests, have your pharmacy contact our office and allow 72 hours for refills to be completed.    Today you received the following chemotherapy and/or immunotherapy agents Carfilzomib, Isatuximab/cytoxan      To help prevent nausea and vomiting after your treatment, we encourage you to take your nausea medication as directed.  BELOW ARE SYMPTOMS THAT SHOULD BE REPORTED IMMEDIATELY: *FEVER GREATER THAN 100.4 F (38 C) OR HIGHER *CHILLS OR SWEATING *NAUSEA AND VOMITING THAT IS NOT CONTROLLED WITH YOUR NAUSEA MEDICATION *UNUSUAL SHORTNESS OF BREATH *UNUSUAL BRUISING OR BLEEDING *URINARY PROBLEMS (pain or burning when urinating, or frequent urination) *BOWEL PROBLEMS (unusual diarrhea, constipation, pain near the anus) TENDERNESS IN MOUTH AND THROAT WITH OR WITHOUT PRESENCE OF ULCERS (sore throat, sores in mouth, or a toothache) UNUSUAL RASH, SWELLING OR PAIN  UNUSUAL VAGINAL DISCHARGE OR ITCHING   Items with * indicate a potential emergency and should be followed up as soon as possible or go to the Emergency Department if any problems should occur.  Please show the CHEMOTHERAPY ALERT CARD or IMMUNOTHERAPY  ALERT CARD at check-in to the Emergency Department and triage nurse. Should you have questions after your visit or need to cancel or reschedule your appointment, please contact [facility] [place]  [phone] and follow the prompts.  Office hours are 8:00 a.m. to 4:30 p.m. Monday - Friday. Please note that voicemails left after 4:00 p.m. may not be returned until the following business day.  We are closed weekends and major holidays. You have access to a nurse at all times for urgent questions. Please call the main number to the clinic [phone] and follow the prompts.  For any non-urgent questions, you may also contact your provider using MyChart. We now offer e-Visits for anyone 58 and older to request care online for non-urgent symptoms. For details visit [email or web address].   Also download the MyChart app! Go to the app store, search "MyChart", open the app, select [facility], and log in with your MyChart username and password.

## 2023-09-10 NOTE — Progress Notes (Signed)
Hematology and Oncology Follow Up Visit  Tami [name] [id number] 11-Nov-1955 67 y.o. 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 is here today for follow-up.  She is responding nicely to treatment.  Her M spike is now down to 1.4 g/dL.  Her IgG level is down to 2400 mg/dL.  The Kappa light chain is down to 1.4 mg/dL.  She did have diarrhea last week.  She did go to the ER for IV fluids.  Still not sure why she has the diarrhea.  She has had no problems with nausea or vomiting.  She has had no mouth sores.  There is been no bleeding.  She has had no leg swelling.  There is been no rashes.  Overall, I would say that her performance status is probably ECOG 1.      Medications:  Allergies as of 09/10/2023       Reactions   Benazepril Anaphylaxis, Swelling, Hives   angioedema Throat and lip swelling   Ondansetron Hcl Hives   Redness and hives post IV admin on 07/05/17   Codeine Nausea And Vomiting   Morphine Hives   Redness and hives noted post IV admin on 07/05/17        Medication List        Accurate as of September 10, 2023  8:26 AM. If you have any questions, ask your nurse or doctor.          acetaminophen 500 MG tablet Commonly known as: TYLENOL Take 500 mg by mouth every 6 (six) hours as needed.   acyclovir 400 MG tablet Commonly known as: ZOVIRAX Take 1 tablet (400 mg total) by mouth 2 (two) times daily.   albuterol 108 (90 Base) MCG/ACT inhaler Commonly known as: VENTOLIN HFA Inhale 2 puffs into the lungs every  6 (six) hours as needed for wheezing or shortness of breath.   albuterol (2.5 MG/3ML) 0.083% nebulizer solution Commonly known as: PROVENTIL Take 3 mLs (2.5 mg total) by nebulization every 6 (six) hours as needed for wheezing or shortness of breath.   ALPRAZolam 0.25 MG tablet Commonly known as: XANAX Take 1-2 tablets (0.25-0.5 mg total) by mouth 3 (three) times daily as needed for anxiety.   aspirin EC 81 MG tablet Take 81 mg by mouth daily. Swallow whole.   budesonide 0.5 MG/2ML nebulizer solution Commonly known as: Pulmicort Take 2 mLs (0.5 mg total) by nebulization 2 (two) times daily as needed (cough, wheezing or shortness of breath).   celecoxib 100 MG capsule Commonly known as: CELEBREX Take 100 mg by mouth 2 (two) times daily.   CENTRUM SILVER WOMEN 50+ PO Take 1 tablet by mouth daily at 6 (six) AM.   cetirizine 10 MG tablet Commonly known as: ZYRTEC Take 10 mg by mouth at bedtime.   cholestyramine light 4 g packet Commonly known as: PREVALITE Take 4 g by mouth 2 (two) times daily.   diclofenac Sodium 1 % Gel Commonly known as: VOLTAREN Apply 2 g topically as directed.  dicyclomine 10 MG capsule Commonly known as: BENTYL Take 1 capsule (10 mg total) by mouth 4 (four) times daily -  before meals and at bedtime. cramping   diphenoxylate-atropine 2.5-0.025 MG tablet Commonly known as: Lomotil Take 1 tablet by mouth 2 (two) times daily.   FLORASTOR PO Take 500 mg by mouth daily at 6 (six) AM.   fluticasone 50 MCG/ACT nasal spray Commonly known as: FLONASE Place 2 sprays into both nostrils daily.   furosemide 20 MG tablet Commonly known as: LASIX Take 20 mg by mouth daily as needed (fluid retention (feet swelling)).   hydrOXYzine 10 MG tablet Commonly known as: ATARAX TAKE ONE (1) TABLET BY MOUTH 3 TIMES DAILY AS NEEDED FOR ITCHING   hyoscyamine 0.125 MG SL tablet Commonly known as: LEVSIN SL Place 1 tablet (0.125 mg total) under the tongue every 4  (four) hours as needed.   lidocaine-prilocaine cream Commonly known as: EMLA Apply 1 Application topically as needed.   loperamide 2 MG capsule Commonly known as: IMODIUM Take 4 mg by mouth in the morning and at bedtime.   losartan 50 MG tablet Commonly known as: COZAAR Take 50 mg by mouth in the morning and at bedtime.   Magnesium Glycinate 100 MG Caps Take 200 mg by mouth at bedtime.   meclizine 12.5 MG tablet Commonly known as: ANTIVERT Take 12.5 mg by mouth 2 (two) times daily as needed for dizziness or nausea.   metoprolol succinate 50 MG 24 hr tablet Commonly known as: TOPROL-XL Take 1 tablet (50 mg total) by mouth 2 (two) times daily. What changed: when to take this   metroNIDAZOLE 500 MG tablet Commonly known as: FLAGYL Take 1 tablet (500 mg total) by mouth 3 (three) times daily.   montelukast 10 MG tablet Commonly known as: Singulair Take 1 tablet (10 mg total) by mouth at bedtime. Please start taking this on September 4.   neomycin-polymyxin-hydrocortisone OTIC solution Commonly known as: CORTISPORIN Place 4 drops into both ears 4 (four) times daily.   nortriptyline 10 MG capsule Commonly known as: Pamelor Take 1 capsule (10 mg total) by mouth at bedtime.   ondansetron 8 MG tablet Commonly known as: Zofran Take 1 tablet (8 mg total) by mouth every 8 (eight) hours as needed for nausea or vomiting.   oxyCODONE-acetaminophen 5-325 MG tablet Commonly known as: PERCOCET/ROXICET Take 1 tablet by mouth every 6 (six) hours as needed for severe pain.   oxyCODONE-acetaminophen 5-325 MG tablet Commonly known as: Percocet Take 2 tablets by mouth every 6 (six) hours as needed.   pantoprazole 40 MG tablet Commonly known as: PROTONIX Take 1 tablet (40 mg total) by mouth daily.   potassium chloride SA 20 MEQ tablet Commonly known as: KLOR-CON M Take 1 tablet (20 mEq total) by mouth daily.   prochlorperazine 10 MG tablet Commonly known as: COMPAZINE Take 0.5  tablets (5 mg total) by mouth 2 (two) times daily as needed for nausea or vomiting.   scopolamine 1 MG/3DAYS Commonly known as: TRANSDERM-SCOP Place 1 patch (1.5 mg total) onto the skin every 3 (three) days.   scopolamine 1 MG/3DAYS Commonly known as: TRANSDERM-SCOP Place 1 patch (1.5 mg total) onto the skin every 3 (three) days.   sertraline 50 MG tablet Commonly known as: ZOLOFT Take 1 tablet (50 mg total) by mouth daily.   spironolactone 25 MG tablet Commonly known as: ALDACTONE Take 25 mg by mouth daily.   Vitamin D3 50 MCG (2000 UT) Tabs Take 2,000 Units  by mouth in the morning.        Allergies:  Allergies  Allergen Reactions   Benazepril Anaphylaxis, Swelling and Hives    angioedema Throat and lip swelling   Ondansetron Hcl Hives    Redness and hives post IV admin on 07/05/17   Codeine Nausea And Vomiting   Morphine Hives    Redness and hives noted post IV admin on 07/05/17    Past Medical History, Surgical history, Social history, and Family History were reviewed and updated.  Review of Systems: Review of Systems  Constitutional: Negative.   HENT: Negative.    Eyes: Negative.   Respiratory: Negative.    Cardiovascular: Negative.   Gastrointestinal: Negative.   Genitourinary: Negative.   Musculoskeletal: Negative.   Skin: Negative.   Neurological: Negative.   Endo/Heme/Allergies: Negative.   Psychiatric/Behavioral: Negative.        Physical Exam: Temperature is 98.6.  Pulse 93.  Blood pressure 141/78.  Weight is 191 pounds     Wt Readings from Last 3 Encounters:  09/10/23 191 lb (86.6 kg)  09/09/23 190 lb (86.2 kg)  09/07/23 190 lb (86.2 kg)    Physical Exam Vitals reviewed.  HENT:     Head: Normocephalic and atraumatic.  Eyes:     Pupils: Pupils are equal, round, and reactive to light.     Comments: Mild pallor of conjunctiva  Cardiovascular:     Rate and Rhythm: Normal rate and regular rhythm.     Heart sounds: Normal heart sounds.   Pulmonary:     Effort: Pulmonary effort is normal.     Breath sounds: Normal breath sounds.  Abdominal:     General: Bowel sounds are normal.     Palpations: Abdomen is soft.  Musculoskeletal:        General: No tenderness or deformity. Normal range of motion.     Cervical back: Normal range of motion.  Lymphadenopathy:     Cervical: No cervical adenopathy.  Skin:    General: Skin is warm and dry.     Findings: No erythema or rash.  Neurological:     Mental Status: She is alert and oriented to person, place, and time.  Psychiatric:        Behavior: Behavior normal.        Thought Content: Thought content normal.        Judgment: Judgment normal.      Lab Results  Component Value Date   WBC 3.4 (L) 09/10/2023   HGB 10.9 (L) 09/10/2023   HCT 33.8 (L) 09/10/2023   MCV 91.8 09/10/2023   PLT 162 09/10/2023   Lab Results  Component Value Date   FERRITIN 309 (H) 08/13/2023   IRON 51 08/13/2023   TIBC 329 08/13/2023   UIBC 278 08/13/2023   IRONPCTSAT 16 08/13/2023   Lab Results  Component Value Date   RETICCTPCT 1.2 11/27/2022   RBC 3.68 (L) 09/10/2023   RETICCTABS 32,640 07/16/2016   Lab Results  Component Value Date   KPAFRELGTCHN 14.0 08/13/2023   LAMBDASER 3.9 (L) 08/13/2023   KAPLAMBRATIO 3.59 (H) 08/13/2023   Lab Results  Component Value Date   IGGSERUM 2,527 (H) 08/13/2023   IGGSERUM 2,415 (H) 08/13/2023   IGA 67 (L) 08/13/2023   IGA 68 (L) 08/13/2023   IGMSERUM 67 08/13/2023   IGMSERUM 66 08/13/2023   Lab Results  Component Value Date   TOTALPROTELP 7.3 08/13/2023   ALBUMINELP 3.2 08/13/2023   A1GS 0.2 08/13/2023   A2GS  0.8 08/13/2023   BETS 1.0 08/13/2023   BETA2SER 0.5 07/16/2016   GAMS 2.0 (H) 08/13/2023   MSPIKE 1.4 (H) 08/13/2023   SPEI Comment 10/27/2022     Chemistry      Component Value Date/Time   NA 142 09/03/2023 1130   NA 143 11/08/2017 1127   NA 140 12/31/2016 1000   K 3.8 09/03/2023 1130   K 4.0 11/08/2017 1127   K 3.6  12/31/2016 1000   CL 107 09/03/2023 1130   CL 107 11/08/2017 1127   CO2 27 09/03/2023 1130   CO2 25 11/08/2017 1127   CO2 24 12/31/2016 1000   BUN 14 09/03/2023 1130   BUN 19 11/08/2017 1127   BUN 22.3 12/31/2016 1000   CREATININE 0.52 09/03/2023 1130   CREATININE 0.64 08/27/2023 0800   CREATININE 0.9 11/08/2017 1127   CREATININE 0.8 12/31/2016 1000      Component Value Date/Time   CALCIUM 8.7 09/03/2023 1130   CALCIUM 9.9 11/08/2017 1127   CALCIUM 9.5 12/31/2016 1000   ALKPHOS 64 09/03/2023 1130   ALKPHOS 44 11/08/2017 1127   ALKPHOS 51 12/31/2016 1000   AST 21 09/03/2023 1130   AST 22 08/27/2023 0800   AST 28 12/31/2016 1000   ALT 18 09/03/2023 1130   ALT 17 08/27/2023 0800   ALT 36 11/08/2017 1127   ALT 16 12/31/2016 1000   BILITOT 0.5 09/03/2023 1130   BILITOT 0.4 08/27/2023 0800   BILITOT 0.46 12/31/2016 1000       Impression and Plan: Ms. Curtner is a very pleasant 67 yo African American female with a previous IgG kappa MGUS with progression to myeloma.  I am not surprised that she has responded.  We will go ahead with the third cycle of treatment.  If she has more diarrhea, we will try to get her into our office for IV fluids.  This would be a whole lot easier for her.  I will go ahead and plan to see her back after Thanksgiving.  We will give her an extra week off so she can enjoy the Thanksgiving holiday.    Josph Macho, MD 11/1/20248:26 AM

## 2023-09-12 ENCOUNTER — Encounter (HOSPITAL_BASED_OUTPATIENT_CLINIC_OR_DEPARTMENT_OTHER): Payer: Self-pay

## 2023-09-12 ENCOUNTER — Other Ambulatory Visit: Payer: Self-pay

## 2023-09-12 ENCOUNTER — Emergency Department (HOSPITAL_BASED_OUTPATIENT_CLINIC_OR_DEPARTMENT_OTHER)
Admission: EM | Admit: 2023-09-12 | Discharge: 2023-09-12 | Disposition: A | Discharge status: Home / Self Care | Payer: 59 | Attending: Emergency Medicine | Admitting: Emergency Medicine

## 2023-09-12 ENCOUNTER — Encounter: Payer: Self-pay | Admitting: Hematology & Oncology

## 2023-09-12 DIAGNOSIS — Z8579 Personal history of other malignant neoplasms of lymphoid, hematopoietic and related tissues: Secondary | ICD-10-CM | POA: Diagnosis not present

## 2023-09-12 DIAGNOSIS — Z79899 Other long term (current) drug therapy: Secondary | ICD-10-CM | POA: Insufficient documentation

## 2023-09-12 DIAGNOSIS — I110 Hypertensive heart disease with heart failure: Secondary | ICD-10-CM | POA: Diagnosis not present

## 2023-09-12 DIAGNOSIS — I10 Essential (primary) hypertension: Secondary | ICD-10-CM | POA: Diagnosis not present

## 2023-09-12 DIAGNOSIS — Z7982 Long term (current) use of aspirin: Secondary | ICD-10-CM | POA: Insufficient documentation

## 2023-09-12 DIAGNOSIS — R197 Diarrhea, unspecified: Secondary | ICD-10-CM | POA: Diagnosis not present

## 2023-09-12 DIAGNOSIS — I509 Heart failure, unspecified: Secondary | ICD-10-CM | POA: Insufficient documentation

## 2023-09-12 DIAGNOSIS — K529 Noninfective gastroenteritis and colitis, unspecified: Secondary | ICD-10-CM | POA: Diagnosis not present

## 2023-09-12 LAB — CBC
HCT: 37.2 % (ref 36.0–46.0)
Hemoglobin: 12 g/dL (ref 12.0–15.0)
MCH: 29.9 pg (ref 26.0–34.0)
MCHC: 32.3 g/dL (ref 30.0–36.0)
MCV: 92.5 fL (ref 80.0–100.0)
Platelets: 115 10*3/uL — ABNORMAL LOW (ref 150–400)
RBC: 4.02 MIL/uL (ref 3.87–5.11)
RDW: 14.5 % (ref 11.5–15.5)
WBC: 2.8 10*3/uL — ABNORMAL LOW (ref 4.0–10.5)
nRBC: 0.7 % — ABNORMAL HIGH (ref 0.0–0.2)

## 2023-09-12 LAB — URINALYSIS, ROUTINE W REFLEX MICROSCOPIC
Bilirubin Urine: NEGATIVE
Glucose, UA: NEGATIVE mg/dL
Hgb urine dipstick: NEGATIVE
Ketones, ur: NEGATIVE mg/dL
Leukocytes,Ua: NEGATIVE
Nitrite: NEGATIVE
Protein, ur: NEGATIVE mg/dL
Specific Gravity, Urine: 1.015 (ref 1.005–1.030)
pH: 6.5 (ref 5.0–8.0)

## 2023-09-12 LAB — LIPASE, BLOOD: Lipase: 41 U/L (ref 11–51)

## 2023-09-12 LAB — COMPREHENSIVE METABOLIC PANEL
ALT: 21 U/L (ref 0–44)
AST: 23 U/L (ref 15–41)
Albumin: 3.5 g/dL (ref 3.5–5.0)
Alkaline Phosphatase: 54 U/L (ref 38–126)
Anion gap: 13 (ref 5–15)
BUN: 19 mg/dL (ref 8–23)
CO2: 27 mmol/L (ref 22–32)
Calcium: 9.6 mg/dL (ref 8.9–10.3)
Chloride: 102 mmol/L (ref 98–111)
Creatinine, Ser: 0.76 mg/dL (ref 0.44–1.00)
GFR, Estimated: >60 mL/min (ref >60)
Glucose, Bld: 121 mg/dL — ABNORMAL HIGH (ref 70–99)
Potassium: 4.1 mmol/L (ref 3.5–5.1)
Sodium: 142 mmol/L (ref 135–145)
Total Bilirubin: 0.5 mg/dL (ref 0.3–1.2)
Total Protein: 7.9 g/dL (ref 6.5–8.1)

## 2023-09-12 NOTE — Discharge Instructions (Signed)
Fortunately your blood work overall reassuring.  No significant signs of dehydration.  Please continue to stay hydrated by drinking Gatorade or fluid that has electrolytes.  You may follow-up with your doctor for further care.  Return to ED if concern

## 2023-09-12 NOTE — ED Provider Notes (Signed)
[facility] Provider Note   CSN: [id number] Arrival date & time: 09/12/23  1429     History  Chief Complaint  Patient presents with   Diarrhea    Tami [name] is a 67 y.o. female.  The history is provided by the patient and medical records. No language interpreter was used.  Diarrhea    67 year old female significant history of multiple myeloma, iron deficiency anemia, SBO, prior stroke, CHF, hypertension, CHF and prior surgical history including cholecystectomy and abdominal hysterectomy presenting with complaint of abdominal pain and diarrhea.  Patient states for the past several years she has had recurrent diarrhea.  Sometimes it would happen 3-4 times a day.  Her doctor and is actively trying to identify the source of the problem as she does have a GI appointment soon.  She mention she would have to go to her doctor's office for IV hydration once every several weeks.  She reports she is feeling dehydrated from her diarrhea recently and would like to get some IV hydration.  She states she is able to keep fluid down and she does not endorse any nausea or vomiting no fever or chills mild abdominal cramping only no trouble urinating but still having multiple episodes of nonbloody not mucousy stool for the past week.  No recent antibiotic use.  No change in her diet.  Home Medications Prior to Admission medications   Medication Sig Start Date End Date Taking? Authorizing Provider  acetaminophen (TYLENOL) 500 MG tablet Take 500 mg by mouth every 6 (six) hours as needed.    [provider]  acyclovir (ZOVIRAX) 400 MG tablet Take 1 tablet (400 mg total) by mouth 2 (two) times daily. 04/22/23   Josph Macho, MD  albuterol (PROVENTIL) (2.5 MG/3ML) 0.083% nebulizer solution Take 3 mLs (2.5 mg total) by nebulization every 6 (six) hours as needed for wheezing or shortness of breath. 12/15/22   [name], MD  albuterol (VENTOLIN HFA)  108 (90 Base) MCG/ACT inhaler Inhale 2 puffs into the lungs every 6 (six) hours as needed for wheezing or shortness of breath. 12/07/22   [name], MD  ALPRAZolam Prudy Feeler) 0.25 MG tablet Take 1-2 tablets (0.25-0.5 mg total) by mouth 3 (three) times daily as needed for anxiety. 06/14/23   [name], MD  aspirin EC 81 MG tablet Take 81 mg by mouth daily. Swallow whole.    [provider]  budesonide (PULMICORT) 0.5 MG/2ML nebulizer solution Take 2 mLs (0.5 mg total) by nebulization 2 (two) times daily as needed (cough, wheezing or shortness of breath). 12/31/22   Martina Sinner, MD  celecoxib (CELEBREX) 100 MG capsule Take 100 mg by mouth 2 (two) times daily. 06/07/23   [provider]  cetirizine (ZYRTEC) 10 MG tablet Take 10 mg by mouth at bedtime.    [provider]  Cholecalciferol (VITAMIN D3) 50 MCG (2000 UT) TABS Take 2,000 Units by mouth in the morning.    [provider]  cholestyramine light (PREVALITE) 4 g packet Take 4 g by mouth 2 (two) times daily.    [provider]  diclofenac Sodium (VOLTAREN) 1 % GEL Apply 2 g topically as directed. 08/19/23 11/17/23  [provider]  dicyclomine (BENTYL) 10 MG capsule Take 1 capsule (10 mg total) by mouth 4 (four) times daily -  before meals and at bedtime. cramping 09/09/23 12/08/23  Judyann Munson, MD  diphenoxylate-atropine (LOMOTIL) 2.5-0.025 MG tablet Take  1 tablet by mouth 2 (two) times daily. 05/19/23   Lynann Bologna, MD  fluticasone (FLONASE) 50 MCG/ACT nasal spray Place 2 sprays into both nostrils daily. 01/07/20   Joy, Shawn C, PA-C  furosemide (LASIX) 20 MG tablet Take 20 mg by mouth daily as needed (fluid retention (feet swelling)). 07/03/20   [provider]  hydrOXYzine (ATARAX) 10 MG tablet TAKE ONE (1) TABLET BY MOUTH 3 TIMES DAILY AS NEEDED FOR ITCHING 03/03/23   [name], MD  hyoscyamine (LEVSIN SL) 0.125 MG SL tablet Place 1 tablet (0.125 mg total) under the  tongue every 4 (four) hours as needed. 04/27/23   [name], MD  lidocaine-prilocaine (EMLA) cream Apply 1 Application topically as needed. 07/01/23   Josph Macho, MD  loperamide (IMODIUM) 2 MG capsule Take 4 mg by mouth in the morning and at bedtime.    [provider]  losartan (COZAAR) 50 MG tablet Take 50 mg by mouth in the morning and at bedtime. 04/14/21   [provider]  Magnesium Glycinate 100 MG CAPS Take 200 mg by mouth at bedtime. 09/21/22   [provider]  meclizine (ANTIVERT) 12.5 MG tablet Take 12.5 mg by mouth 2 (two) times daily as needed for dizziness or nausea. 01/28/22   [provider]  metoprolol succinate (TOPROL-XL) 50 MG 24 hr tablet Take 1 tablet (50 mg total) by mouth 2 (two) times daily. Patient taking differently: Take 50 mg by mouth daily. 02/23/23   [name], MD  metroNIDAZOLE (FLAGYL) 500 MG tablet Take 1 tablet (500 mg total) by mouth 3 (three) times daily. 08/25/23   Josph Macho, MD  montelukast (SINGULAIR) 10 MG tablet Take 1 tablet (10 mg total) by mouth at bedtime. Please start taking this on September 4. 07/01/23   Josph Macho, MD  Multiple Vitamins-Minerals (CENTRUM SILVER WOMEN 50+ PO) Take 1 tablet by mouth daily at 6 (six) AM.    [provider]  neomycin-polymyxin-hydrocortisone (CORTISPORIN) OTIC solution Place 4 drops into both ears 4 (four) times daily. 07/30/23   Josph Macho, MD  nortriptyline (PAMELOR) 10 MG capsule Take 1 capsule (10 mg total) by mouth at bedtime. 06/14/23   [name], MD  ondansetron (ZOFRAN) 8 MG tablet Take 1 tablet (8 mg total) by mouth every 8 (eight) hours as needed for nausea or vomiting. 07/14/23   Josph Macho, MD  oxyCODONE-acetaminophen (PERCOCET) 5-325 MG tablet Take 2 tablets by mouth every 6 (six) hours as needed. 08/07/23   Charlynne Pander, MD  oxyCODONE-acetaminophen (PERCOCET/ROXICET) 5-325 MG tablet Take 1 tablet by mouth every 6 (six) hours  as needed for severe pain. 11/07/21   [provider]  pantoprazole (PROTONIX) 40 MG tablet Take 1 tablet (40 mg total) by mouth daily. 09/03/23   Sandford Craze, NP  potassium chloride SA (KLOR-CON M) 20 MEQ tablet Take 1 tablet (20 mEq total) by mouth daily. 01/15/23   [name], MD  prochlorperazine (COMPAZINE) 10 MG tablet Take 0.5 tablets (5 mg total) by mouth 2 (two) times daily as needed for nausea or vomiting. 03/16/23   [name], MD  Saccharomyces boulardii (FLORASTOR PO) Take 500 mg by mouth daily at 6 (six) AM.    [provider]  scopolamine (TRANSDERM-SCOP) 1 MG/3DAYS Place 1 patch (1.5 mg total) onto the skin every 3 (three) days. 06/14/23   [name], MD  scopolamine (TRANSDERM-SCOP) 1 MG/3DAYS Place 1 patch (1.5  mg total) onto the skin every 3 (three) days. 07/01/23   Josph Macho, MD  sertraline (ZOLOFT) 50 MG tablet Take 1 tablet (50 mg total) by mouth daily. 06/14/23   [name], MD  spironolactone (ALDACTONE) 25 MG tablet Take 25 mg by mouth daily.    [provider]      Allergies    Benazepril, Ondansetron hcl, Codeine, and Morphine    Review of Systems   Review of Systems  Gastrointestinal:  Positive for diarrhea.  All other systems reviewed and are negative.   Physical Exam Updated Vital Signs BP (!) 150/118 (BP Location: Right Arm)   Pulse 82   Temp (!) 97.4 F (36.3 C)   Resp 20   Ht 5\' 6"  (1.676 m)   Wt 87.1 kg   SpO2 98%   BMI 30.99 kg/m  Physical Exam Vitals and nursing note reviewed.  Constitutional:      General: She is not in acute distress.    Appearance: She is well-developed.  HENT:     Head: Atraumatic.  Eyes:     Conjunctiva/sclera: Conjunctivae normal.  Cardiovascular:     Rate and Rhythm: Normal rate and regular rhythm.     Pulses: Normal pulses.     Heart sounds: Normal heart sounds.  Pulmonary:     Effort: Pulmonary effort is normal.  Abdominal:     General: Bowel sounds are  normal.     Palpations: Abdomen is soft.     Tenderness: There is no abdominal tenderness.  Musculoskeletal:        General: Normal range of motion.     Cervical back: Neck supple.  Skin:    Findings: No rash.  Neurological:     Mental Status: She is alert. Mental status is at baseline.  Psychiatric:        Mood and Affect: Mood normal.     ED Results / Procedures / Treatments   Labs (all labs ordered are listed, but only abnormal results are displayed) Labs Reviewed  COMPREHENSIVE METABOLIC PANEL - Abnormal; Notable for the following components:      Result Value   Glucose, Bld 121 (*)    All other components within normal limits  CBC - Abnormal; Notable for the following components:   WBC 2.8 (*)    Platelets 115 (*)    nRBC 0.7 (*)    All other components within normal limits  LIPASE, BLOOD  URINALYSIS, ROUTINE W REFLEX MICROSCOPIC    EKG None  Radiology No results found.  Procedures Procedures    Medications Ordered in ED Medications - No data to display  ED Course/ Medical Decision Making/ A&P                                 Medical Decision Making Amount and/or Complexity of Data Reviewed Labs: ordered.   BP (!) 150/118 (BP Location: Right Arm)   Pulse 82   Temp (!) 97.4 F (36.3 C)   Resp 20   Ht 5\' 6"  (1.676 m)   Wt 87.1 kg   SpO2 98%   BMI 30.99 kg/m   2:32 PM  67 year old female significant history of multiple myeloma, iron deficiency anemia, SBO, prior stroke, CHF, hypertension, CHF and prior surgical history including cholecystectomy and abdominal hysterectomy presenting with complaint of abdominal pain and diarrhea.  Patient states for the past several years she has had recurrent diarrhea.  Sometimes it would happen 3-4 times a day.  Her doctor and is actively trying to identify the source of the problem as she does have a GI appointment soon.  She mention she would have to go to her doctor's office for IV hydration once every several  weeks.  She reports she is feeling dehydrated from her diarrhea recently and would like to get some IV hydration.  She states she is able to keep fluid down and she does not endorse any nausea or vomiting no fever or chills mild abdominal cramping only no trouble urinating but still having multiple episodes of nonbloody not mucousy stool for the past week.  No recent antibiotic use.  No change in her diet.  Exam overall reassuring abdomen soft nontender, patient does not appear to be dehydrated heart and lung with normal rate and rhythm and lungs are clear.  -Labs ordered, independently viewed and interpreted by me.  Labs remarkable for leukopenia at baseline, thrombocytopenia at baseline -The patient was maintained on a cardiac monitor.  I personally viewed and interpreted the cardiac monitored which showed an underlying rhythm of: NSR -Imaging including abd/pelvis CT considered but not performed as diarrhea is chronic.  C.diff considered but no recent abx use -This patient presents to the ED for concern of diarrhea, this involves an extensive number of treatment options, and is a complaint that carries with it a high risk of complications and morbidity.  The differential diagnosis includes function diarrhea, poor absorption, infectious diarrhea -Co morbidities that complicate the patient evaluation includes c.diff, multiple myeloma, chf, stroke, htn -Treatment includes oral hydration -Reevaluation of the patient after these medicines showed that the patient improved -PCP office notes or outside notes reviewed -Escalation to admission/observation considered: patients feels much better, is comfortable with discharge, and will follow up with PCP -Prescription medication considered, patient comfortable with oral hydration and outpt f/u -Social Determinant of Health considered which includes lack of mobility         Final Clinical Impression(s) / ED Diagnoses Final diagnoses:  Chronic diarrhea     Rx / DC Orders ED Discharge Orders     None         Fayrene Helper, PA-C 09/12/23 Mallie Snooks    Arby Barrette, MD 09/12/23 2151

## 2023-09-12 NOTE — ED Triage Notes (Signed)
The patient has been having increased diarrhea. She stated she needs IV fluids. She is having weakness.

## 2023-09-12 NOTE — ED Notes (Signed)
ED Provider at bedside. 

## 2023-09-13 LAB — KAPPA/LAMBDA LIGHT CHAINS
Kappa free light chain: 9.2 mg/L (ref 3.3–19.4)
Kappa, lambda light chain ratio: >6.13 — ABNORMAL HIGH (ref 0.26–1.65)
Lambda free light chains: <1.5 mg/L — ABNORMAL LOW (ref 5.7–26.3)

## 2023-09-14 ENCOUNTER — Ambulatory Visit: Payer: Self-pay

## 2023-09-14 LAB — IGG, IGA, IGM
IgA: 33 mg/dL — ABNORMAL LOW (ref 87–352)
IgG (Immunoglobin G), Serum: 1846 mg/dL — ABNORMAL HIGH (ref 586–1602)
IgM (Immunoglobulin M), Srm: 40 mg/dL (ref 26–217)

## 2023-09-14 NOTE — Patient Outreach (Signed)
  Care Coordination   Follow Up Visit Note   09/14/2023 Name: Tami [name] MRN: [id number] DOB: 1955-12-21  Tami [name] is a 67 y.o. year old female who sees [name], MD for primary care. I spoke with  [name] and [name] Murchison(dpr) by phone today.  What matters to the patients health and wellness today?  RNCM contacted [name](dpr) who placed patient on 3 way call. Ms. Howell reports diarrhea is intermittent. She reports she received a call from gastroenterologist and has an appointment in a couple of weeks. Per review of chart, only appointment seen is 10/25/23 with [facility]. Ms Kandis Fantasia expresses patient should be scheduled with [facility] provider. Per review of chart referral placed to Dr. Chales Abrahams, [facility]. RNCM called to follow up, and was informed earliest appointment available is February 2025. In addition patient has an appointment already with an GI provider, therefore Scheduler expressed that they would not be calling patient to schedule with [facility].    Goals Addressed             This Visit's Progress    Assistance with Health Management       Interventions Today    Flowsheet Row Most Recent Value  Chronic Disease   Chronic disease during today's visit Other  [chronic diarrhea, multiple myoloma]  General Interventions   General Interventions Discussed/Reviewed General Interventions Reviewed, Communication with  [Evaluation of current treatment plan for health condition and patient's adherence to plan.]  Doctor Visits Discussed/Reviewed Doctor Visits Reviewed, Doctor Visits Discussed  [reviewed upcoming scheduled appointments,]  Communication with PCP/Specialists  [facility] gastroenterology, Dr. Chales Abrahams to follow up on scheduling,  message to PCP to update]  Education Interventions   Provided Verbal Education On Medication, When to see the doctor  [advised to contact provider with health questions, concerns as needed, reiterated importance of  hydration.]            SDOH assessments and interventions completed:  No  Care Coordination Interventions:  Yes, provided   Follow up plan: Follow up call scheduled for 09/23/23    Encounter Outcome:  Patient Visit Completed   Kathyrn Sheriff, RN, MSN, BSN, CCM Care Management Coordinator [phone]

## 2023-09-14 NOTE — Patient Instructions (Addendum)
Visit Information  Thank you for taking time to visit with me today. Please don't hesitate to contact me if I can be of assistance to you.   Following are the goals we discussed today:  Continue to stay hydrated as recommended by your provider Continue to take medications as prescribed. Continue to attend provider visits as scheduled Continue to eat healthy, lean meats, vegetables, fruits, avoid saturated and transfats Contact provider with health questions or concerns as needed   Our next appointment is by telephone on 09/23/23 at 1:30 pm  Please call the care guide team at [phone] if you need to cancel or reschedule your appointment.   If you are experiencing a Mental Health or Behavioral Health Crisis or need someone to talk to, please call the Suicide and Crisis Lifeline: 988 call the [place] National Suicide Prevention Lifeline: 670-187-2184 or TTY: (628)471-8370 TTY (360)148-9867) to talk to a trained counselor call 1-800-273-TALK (toll free, 24 hour hotline)  Kathyrn Sheriff, RN, MSN, BSN, CCM Care Management Coordinator [phone]

## 2023-09-15 DIAGNOSIS — R197 Diarrhea, unspecified: Secondary | ICD-10-CM | POA: Diagnosis not present

## 2023-09-15 DIAGNOSIS — I493 Ventricular premature depolarization: Secondary | ICD-10-CM | POA: Diagnosis not present

## 2023-09-15 NOTE — Assessment & Plan Note (Signed)
Encourage heart healthy diet such as MIND or DASH diet, increase exercise, avoid trans fats, simple carbohydrates and processed foods, consider a krill or fish or flaxseed oil cap daily.  °

## 2023-09-15 NOTE — Assessment & Plan Note (Signed)
No recent exacerbation, follows with cardiology

## 2023-09-15 NOTE — Assessment & Plan Note (Signed)
Well controlled, no changes to meds. Encouraged heart healthy diet such as the DASH diet and exercise as tolerated.  °

## 2023-09-15 NOTE — Assessment & Plan Note (Signed)
Supplement and monitor 

## 2023-09-15 NOTE — Assessment & Plan Note (Signed)
hgba1c acceptable, minimize simple carbs. Increase exercise as tolerated.  

## 2023-09-15 NOTE — Assessment & Plan Note (Signed)
Hydrate and monitor 

## 2023-09-16 ENCOUNTER — Encounter: Payer: Self-pay | Admitting: Family Medicine

## 2023-09-16 ENCOUNTER — Ambulatory Visit (INDEPENDENT_AMBULATORY_CARE_PROVIDER_SITE_OTHER): Payer: 59 | Admitting: Family Medicine

## 2023-09-16 VITALS — BP 124/82 | HR 83 | Temp 98.1°F | Resp 18 | Ht 66.0 in | Wt 189.2 lb

## 2023-09-16 DIAGNOSIS — Z1211 Encounter for screening for malignant neoplasm of colon: Secondary | ICD-10-CM

## 2023-09-16 DIAGNOSIS — I5042 Chronic combined systolic (congestive) and diastolic (congestive) heart failure: Secondary | ICD-10-CM

## 2023-09-16 DIAGNOSIS — D649 Anemia, unspecified: Secondary | ICD-10-CM | POA: Diagnosis not present

## 2023-09-16 DIAGNOSIS — E785 Hyperlipidemia, unspecified: Secondary | ICD-10-CM

## 2023-09-16 DIAGNOSIS — R739 Hyperglycemia, unspecified: Secondary | ICD-10-CM

## 2023-09-16 DIAGNOSIS — I10 Essential (primary) hypertension: Secondary | ICD-10-CM | POA: Diagnosis not present

## 2023-09-16 DIAGNOSIS — J029 Acute pharyngitis, unspecified: Secondary | ICD-10-CM | POA: Diagnosis not present

## 2023-09-16 DIAGNOSIS — R197 Diarrhea, unspecified: Secondary | ICD-10-CM

## 2023-09-16 LAB — POCT INFLUENZA A/B
Influenza A, POC: NEGATIVE
Influenza B, POC: NEGATIVE

## 2023-09-16 LAB — COMPREHENSIVE METABOLIC PANEL
ALT: 13 U/L (ref 0–35)
AST: 18 U/L (ref 0–37)
Albumin: 3.8 g/dL (ref 3.5–5.2)
Alkaline Phosphatase: 56 U/L (ref 39–117)
BUN: 15 mg/dL (ref 6–23)
CO2: 27 meq/L (ref 19–32)
Calcium: 9 mg/dL (ref 8.4–10.5)
Chloride: 107 meq/L (ref 96–112)
Creatinine, Ser: 0.63 mg/dL (ref 0.40–1.20)
GFR: 90.59 mL/min (ref >60.00)
Glucose, Bld: 98 mg/dL (ref 70–99)
Potassium: 4.3 meq/L (ref 3.5–5.1)
Sodium: 140 meq/L (ref 135–145)
Total Bilirubin: 0.5 mg/dL (ref 0.2–1.2)
Total Protein: 7.2 g/dL (ref 6.0–8.3)

## 2023-09-16 LAB — CBC WITH DIFFERENTIAL/PLATELET
Basophils Absolute: 0 10*3/uL (ref 0.0–0.1)
Basophils Relative: 0.6 % (ref 0.0–3.0)
Eosinophils Absolute: 0 10*3/uL (ref 0.0–0.7)
Eosinophils Relative: 1.3 % (ref 0.0–5.0)
HCT: 36.2 % (ref 36.0–46.0)
Hemoglobin: 11.9 g/dL — ABNORMAL LOW (ref 12.0–15.0)
Lymphocytes Relative: 22.1 % (ref 12.0–46.0)
Lymphs Abs: 0.7 10*3/uL (ref 0.7–4.0)
MCHC: 32.8 g/dL (ref 30.0–36.0)
MCV: 93.1 fL (ref 78.0–100.0)
Monocytes Absolute: 0.4 10*3/uL (ref 0.1–1.0)
Monocytes Relative: 12.1 % — ABNORMAL HIGH (ref 3.0–12.0)
Neutro Abs: 2.1 10*3/uL (ref 1.4–7.7)
Neutrophils Relative %: 63.9 % (ref 43.0–77.0)
Platelets: 94 10*3/uL — ABNORMAL LOW (ref 150.0–400.0)
RBC: 3.89 Mil/uL (ref 3.87–5.11)
RDW: 15.7 % — ABNORMAL HIGH (ref 11.5–15.5)
WBC: 3.3 10*3/uL — ABNORMAL LOW (ref 4.0–10.5)

## 2023-09-16 LAB — MAGNESIUM: Magnesium: 1.9 mg/dL (ref 1.5–2.5)

## 2023-09-16 LAB — FERRITIN: Ferritin: 364.6 ng/mL — ABNORMAL HIGH (ref 10.0–291.0)

## 2023-09-16 LAB — POCT RAPID STREP A (OFFICE): Rapid Strep A Screen: POSITIVE — AB

## 2023-09-16 LAB — POC COVID19 BINAXNOW: SARS Coronavirus 2 Ag: NEGATIVE

## 2023-09-16 LAB — HEMOGLOBIN A1C: Hgb A1c MFr Bld: 5.5 % (ref 4.6–6.5)

## 2023-09-16 LAB — TSH: TSH: 1.4 u[IU]/mL (ref 0.35–5.50)

## 2023-09-16 MED ORDER — FAMOTIDINE 20 MG PO TABS: 20.0000 mg | ORAL_TABLET | Freq: Every day | ORAL | 2 refills | Status: AC

## 2023-09-16 MED ORDER — AMOXICILLIN 500 MG PO CAPS
500.0000 mg | ORAL_CAPSULE | Freq: Three times a day (TID) | ORAL | 0 refills | Status: AC
Start: 2023-09-16 — End: 2023-09-26

## 2023-09-16 NOTE — Patient Instructions (Addendum)
Take Ondansetron morning and night for next 3 days at least also take Compazine at noon   Diarrhea, Adult Diarrhea is frequent loose and sometimes watery bowel movements. Diarrhea can make you feel weak and cause you to become dehydrated. Dehydration is a condition in which there is not enough water or other fluids in the body. Dehydration can make you tired and thirsty, cause you to have a dry mouth, and decrease how often you urinate. Diarrhea typically lasts 2-3 days. However, it can last longer if it is a sign of something more serious. It is important to treat your diarrhea as told by your health care provider. Follow these instructions at home: Eating and drinking     Follow these recommendations as told by your health care provider: Take an oral rehydration solution (ORS). This is an over-the-counter medicine that helps return your body to its normal balance of nutrients and water. It is found at pharmacies and retail stores. Drink enough fluid to keep your urine pale yellow. Drink fluids such as water, diluted fruit juice, and low-calorie sports drinks. You can drink milk also, if desired. Sucking on ice chips is another way to get fluids. Avoid drinking fluids that contain a lot of sugar or caffeine, such as soda, energy drinks, and regular sports drinks. Avoid alcohol. Eat bland, easy-to-digest foods in small amounts as you are able. These foods include bananas, applesauce, rice, lean meats, toast, and crackers. Avoid spicy or fatty foods.  Medicines Take over-the-counter and prescription medicines only as told by your health care provider. If you were prescribed antibiotics, take them as told by your health care provider. Do not stop using the antibiotic even if you start to feel better. General instructions  Wash your hands often using soap and water for at least 20 seconds. If soap and water are not available, use hand sanitizer. Others in the household should wash their hands as  well. Hands should be washed: After using the toilet or changing a diaper. Before preparing, cooking, or serving food. While caring for a sick person or while visiting someone in a hospital. Rest at home while you recover. Take a warm bath to relieve any burning or pain from frequent diarrhea episodes. Watch your condition for any changes. Contact a health care provider if: You have a fever. Your diarrhea gets worse. You have new symptoms. You vomit every time you eat or drink. You feel light-headed, dizzy, or have a headache. You have muscle cramps. You have signs of dehydration, such as: Dark urine, very little urine, or no urine. Cracked lips. Dry mouth. Sunken eyes. Sleepiness. Weakness. You have bloody or black stools or stools that look like tar. You have severe pain, cramping, or bloating in your abdomen. Your skin feels cold and clammy. You feel confused. Get help right away if: You have chest pain or your heart is beating very quickly. You have trouble breathing or you are breathing very quickly. You feel extremely weak or you faint. These symptoms may be an emergency. Get help right away. Call 911. Do not wait to see if the symptoms will go away. Do not drive yourself to the hospital. This information is not intended to replace advice given to you by your health care provider. Make sure you discuss any questions you have with your health care provider. Document Revised: 04/14/2022 Document Reviewed: 04/14/2022 Elsevier Patient Education  2024 [employer].

## 2023-09-16 NOTE — Progress Notes (Signed)
Subjective:    [name], female    DOB: [birth year], 67 y.o.   MRN: [id number]  Chief Complaint  Patient presents with  . Follow-up    HPI Discussed the use of AI scribe software for clinical note transcription with the patient, who gave verbal consent to proceed.  History of Present Illness   The patient, with a history of recurrent C. diff infections and currently undergoing chemotherapy for myeloma, presents with persistent diarrhea and vomiting for the past couple of weeks. The diarrhea has been consistent, with three to five episodes daily. The vomiting, while not copious, occurs about three times a day, usually after the patient attempts to drink fluids. The patient also reports a burning sensation in the stomach, which she describes as a "fever in the stomach." The patient has been managing the nausea with ondansetron and Compazine, which provide temporary relief.  In addition to the gastrointestinal symptoms, the patient woke up with a sore throat on the day of the visit. The patient describes the throat as red and burning, and it is painful to swallow. The patient denies any associated symptoms such as headache, fever, cough, or heartburn. The patient has been taking pantoprazole for stomach acid and reports no significant heartburn.        Past Medical History:  Diagnosis Date  . Abnormal SPEP 07/26/2016  . Arthritis    knees, hands  . Back pain 07/14/2016  . Bowel obstruction (HCC) 11/2014  . C. difficile diarrhea   . CHF (congestive heart failure) (HCC)   . Colitis   . Congestive heart failure (HCC) 02/24/2015   S/p pacemaker  . Depression   . Elevated sed rate 08/15/2017  . Epigastric pain 03/22/2017  . GERD (gastroesophageal reflux disease)   . Heart disease   . Hyperlipidemia   . Hypertension   . 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 Iron deficiency anemia due to chronic blood loss 05/28/2022  . Low back pain 07/14/2016  . 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 Multiple myeloma (HCC) 07/20/2018  . Multiple myeloma not having achieved remission (HCC) 07/20/2018  . Myalgia 12/31/2016  . Obstructive sleep apnea 03/31/2015  . Presence of permanent cardiac pacemaker   . SOB (shortness of breath) 04/09/2016  . Stroke (HCC)    TIAs  . UTI (urinary tract infection)     Past Surgical History:  Procedure Laterality Date  . ABDOMINAL HYSTERECTOMY     menorraghia, 2006, total  . BIOPSY  09/01/2022   Procedure: BIOPSY;  Surgeon: Lynann Bologna, MD;  Location: [facility];  Service: Gastroenterology;;  . [place] [facility]    . COLONOSCOPY  2018   [facility] per patient  . COLONOSCOPY WITH PROPOFOL N/A 09/01/2022   Procedure: COLONOSCOPY WITH PROPOFOL;  Surgeon: Lynann Bologna, MD;  Location: [facility];  Service: Gastroenterology;  Laterality: N/A;  . ESOPHAGOGASTRODUODENOSCOPY  2018   [facility]   . ESOPHAGOGASTRODUODENOSCOPY (EGD) WITH PROPOFOL N/A 09/01/2022   Procedure: ESOPHAGOGASTRODUODENOSCOPY (EGD) WITH PROPOFOL;  Surgeon: Lynann Bologna, MD;  Location: [facility];  Service: Gastroenterology;  Laterality: N/A;  . IR IMAGING GUIDED PORT INSERTION  06/29/2023  . KNEE SURGERY     right, repair torn torn cartialage  . PACEMAKER INSERTION  08/2014   changed last in 2023 per pt at [facility]  . TONSILLECTOMY    . TUBAL LIGATION      Family History  Problem Relation Age of Onset  . Diabetes Mother   .  Hypertension Mother   . Heart disease Mother        s/p 1 stent  . Hyperlipidemia Mother   . Arthritis Mother   . Cancer Father        COLON  . Colon cancer Father 21  . Irritable bowel syndrome Sister   . Hyperlipidemia Daughter   . Hypertension Daughter   . Hypertension Maternal Grandmother   . Arthritis Maternal Grandmother   . Heart disease Maternal Grandfather        MI  . Hypertension Maternal Grandfather   . Arthritis Maternal Grandfather   .  Hypertension Son   . Esophageal cancer Neg Hx     Social History   Socioeconomic History  . Marital status: Single    Spouse name: [name]  . Number of children: 3  . Years of education: [name]  . Highest education level: High school graduate  Occupational History  . [name]  Tobacco Use  . Smoking status: Never    Passive exposure: Never  . Smokeless tobacco: Never  Vaping Use  . Vaping status: Never Used  Substance and Sexual Activity  . Alcohol use: No  . Drug use: No  . Sexual activity: Not Currently  Other Topics Concern  . [name]  Social History Narrative   Lives at home alone   Retired   Caffeine: coffee   Social Determinants of Health   Financial Resource Strain: Medium Risk (07/08/2023)   Overall Financial Resource Strain (CARDIA)   . Difficulty of Paying Living Expenses: Somewhat hard  Food Insecurity: Low Risk  (08/17/2023)   Received from [facility]   Hunger Vital Sign   . Worried About Programme researcher, broadcasting/film/video in the Last Year: Never true   . Ran Out of Food in the Last Year: Never true  Transportation Needs: No Transportation Needs (08/17/2023)   Received from [facility]   . In the past 12 months, has lack of reliable transportation kept you from medical appointments, meetings, work or from getting things needed for daily living? : No  Physical Activity: Inactive (07/08/2023)   Exercise Vital Sign   . Days of Exercise per Week: 0 days   . Minutes of Exercise per Session: 0 min  Stress: No Stress Concern Present (01/21/2023)   [employer] of Occupational Health - Occupational Stress Questionnaire   . Feeling of Stress : Not at all  Social Connections: Moderately Isolated (07/08/2023)   Social Connection and Isolation Panel [NHANES]   . Frequency of Communication with Friends and Family: More than three times a week   . Frequency of Social Gatherings with Friends and Family: Twice a week   . Attends Religious  Services: More than 4 times per year   . Active Member of Clubs or Organizations: No   . Attends Banker Meetings: Never   . Marital Status: Divorced  Catering manager Violence: Not At Risk (03/28/2023)   Humiliation, Afraid, Rape, and Kick questionnaire   . Fear of Current or Ex-Partner: No   . Emotionally Abused: No   . Physically Abused: No   . Sexually Abused: No    Outpatient Medications Prior to Visit  Medication Sig Dispense Refill  . acetaminophen (TYLENOL) 500 MG tablet Take 500 mg by mouth every 6 (six) hours as needed.    Marland Kitchen acyclovir (ZOVIRAX) 400 MG tablet Take 1 tablet (400 mg total) by mouth 2 (two) times daily. 60  tablet 3  . albuterol (PROVENTIL) (2.5 MG/3ML) 0.083% nebulizer solution Take 3 mLs (2.5 mg total) by nebulization every 6 (six) hours as needed for wheezing or shortness of breath. 150 mL 2  . albuterol (VENTOLIN HFA) 108 (90 Base) MCG/ACT inhaler Inhale 2 puffs into the lungs every 6 (six) hours as needed for wheezing or shortness of breath. 18 g 5  . ALPRAZolam (XANAX) 0.25 MG tablet Take 1-2 tablets (0.25-0.5 mg total) by mouth 3 (three) times daily as needed for anxiety. 40 tablet 1  . aspirin EC 81 MG tablet Take 81 mg by mouth daily. Swallow whole.    . budesonide (PULMICORT) 0.5 MG/2ML nebulizer solution Take 2 mLs (0.5 mg total) by nebulization 2 (two) times daily as needed (cough, wheezing or shortness of breath). 120 mL 11  . celecoxib (CELEBREX) 100 MG capsule Take 100 mg by mouth 2 (two) times daily.    . cetirizine (ZYRTEC) 10 MG tablet Take 10 mg by mouth at bedtime.    . Cholecalciferol (VITAMIN D3) 50 MCG (2000 UT) TABS Take 2,000 Units by mouth in the morning.    . cholestyramine light (PREVALITE) 4 g packet Take 4 g by mouth 2 (two) times daily.    . diclofenac Sodium (VOLTAREN) 1 % GEL Apply 2 g topically as directed.    . dicyclomine (BENTYL) 10 MG capsule Take 1 capsule (10 mg total) by mouth 4 (four) times daily -  before meals and  at bedtime. cramping 90 capsule 1  . diphenoxylate-atropine (LOMOTIL) 2.5-0.025 MG tablet Take 1 tablet by mouth 2 (two) times daily. 60 tablet 6  . fluticasone (FLONASE) 50 MCG/ACT nasal spray Place 2 sprays into both nostrils daily. 16 g 0  . furosemide (LASIX) 20 MG tablet Take 20 mg by mouth daily as needed (fluid retention (feet swelling)).    . hydrOXYzine (ATARAX) 10 MG tablet TAKE ONE (1) TABLET BY MOUTH 3 TIMES DAILY AS NEEDED FOR ITCHING 30 tablet 2  . hyoscyamine (LEVSIN SL) 0.125 MG SL tablet Place 1 tablet (0.125 mg total) under the tongue every 4 (four) hours as needed. 30 tablet 1  . lidocaine-prilocaine (EMLA) cream Apply 1 Application topically as needed. 30 g 0  . loperamide (IMODIUM) 2 MG capsule Take 4 mg by mouth in the morning and at bedtime.    Marland Kitchen losartan (COZAAR) 50 MG tablet Take 50 mg by mouth in the morning and at bedtime.    . Magnesium Glycinate 100 MG CAPS Take 200 mg by mouth at bedtime.    . meclizine (ANTIVERT) 12.5 MG tablet Take 12.5 mg by mouth 2 (two) times daily as needed for dizziness or nausea.    . metoprolol succinate (TOPROL-XL) 50 MG 24 hr tablet Take 1 tablet (50 mg total) by mouth 2 (two) times daily. (Patient taking differently: Take 50 mg by mouth daily.) 60 tablet 2  . metroNIDAZOLE (FLAGYL) 500 MG tablet Take 1 tablet (500 mg total) by mouth 3 (three) times daily. 30 tablet 2  . montelukast (SINGULAIR) 10 MG tablet Take 1 tablet (10 mg total) by mouth at bedtime. Please start taking this on September 4. 30 tablet 3  . Multiple Vitamins-Minerals (CENTRUM SILVER WOMEN 50+ PO) Take 1 tablet by mouth daily at 6 (six) AM.    . neomycin-polymyxin-hydrocortisone (CORTISPORIN) OTIC solution Place 4 drops into both ears 4 (four) times daily. 10 mL 4  . nortriptyline (PAMELOR) 10 MG capsule Take 1 capsule (10 mg total) by mouth at  bedtime. 30 capsule 1  . ondansetron (ZOFRAN) 8 MG tablet Take 1 tablet (8 mg total) by mouth every 8 (eight) hours as needed for  nausea or vomiting. 30 tablet 1  . oxyCODONE-acetaminophen (PERCOCET) 5-325 MG tablet Take 2 tablets by mouth every 6 (six) hours as needed. 10 tablet 0  . oxyCODONE-acetaminophen (PERCOCET/ROXICET) 5-325 MG tablet Take 1 tablet by mouth every 6 (six) hours as needed for severe pain.    . pantoprazole (PROTONIX) 40 MG tablet Take 1 tablet (40 mg total) by mouth daily. 30 tablet 3  . potassium chloride SA (KLOR-CON M) 20 MEQ tablet Take 1 tablet (20 mEq total) by mouth daily. 90 tablet 1  . prochlorperazine (COMPAZINE) 10 MG tablet Take 0.5 tablets (5 mg total) by mouth 2 (two) times daily as needed for nausea or vomiting. 60 tablet 1  . Saccharomyces boulardii (FLORASTOR PO) Take 500 mg by mouth daily at 6 (six) AM.    . scopolamine (TRANSDERM-SCOP) 1 MG/3DAYS Place 1 patch (1.5 mg total) onto the skin every 3 (three) days. 10 patch 1  . sertraline (ZOLOFT) 50 MG tablet Take 1 tablet (50 mg total) by mouth daily. 30 tablet 3  . spironolactone (ALDACTONE) 25 MG tablet Take 25 mg by mouth daily.    Marland Kitchen scopolamine (TRANSDERM-SCOP) 1 MG/3DAYS Place 1 patch (1.5 mg total) onto the skin every 3 (three) days. 2 patch 0   Facility-Administered Medications Prior to Visit  Medication Dose Route Frequency Provider Last Rate Last Admin  . nitroGLYCERIN (NITROSTAT) SL tablet 0.4 mg  0.4 mg Sublingual Once [name], MD        Allergies  Allergen Reactions  . Benazepril Anaphylaxis, Swelling and Hives    angioedema Throat and lip swelling  . Ondansetron Hcl Hives    Redness and hives post IV admin on 07/05/17  . Codeine Nausea And Vomiting  . Morphine Hives    Redness and hives noted post IV admin on 07/05/17    Review of Systems  Constitutional:  Positive for malaise/fatigue. Negative for fever.  HENT:  Positive for sore throat. Negative for congestion and sinus pain.   Eyes:  Negative for blurred vision.  Respiratory:  Negative for cough, sputum production and shortness of breath.    Cardiovascular:  Negative for chest pain, palpitations and leg swelling.  Gastrointestinal:  Positive for abdominal pain, diarrhea and vomiting. Negative for blood in stool, melena and nausea.  Genitourinary:  Negative for dysuria and frequency.  Musculoskeletal:  Negative for falls.  Skin:  Negative for rash.  Neurological:  Negative for dizziness, loss of consciousness and headaches.  Endo/Heme/Allergies:  Negative for environmental allergies.  Psychiatric/Behavioral:  Negative for depression. The patient is not nervous/anxious.       Objective:    Physical Exam Constitutional:      General: She is not in acute distress.    Appearance: Normal appearance. She is well-developed. She is ill-appearing. She is not toxic-appearing.  HENT:     Head: Normocephalic and atraumatic.     Right Ear: External ear normal.     Left Ear: External ear normal.     Nose: Nose normal.  Eyes:     General:        Right eye: No discharge.        Left eye: No discharge.     Conjunctiva/sclera: Conjunctivae normal.  Neck:     Thyroid: No thyromegaly.  Cardiovascular:     Rate and Rhythm: Normal rate  and regular rhythm.     Heart sounds: Normal heart sounds. No murmur heard. Pulmonary:     Effort: Pulmonary effort is normal. No respiratory distress.     Breath sounds: Normal breath sounds.  Abdominal:     General: Bowel sounds are normal.     Palpations: Abdomen is soft.     Tenderness: There is no abdominal tenderness. There is no guarding.  Musculoskeletal:        General: Normal range of motion.     Cervical back: Neck supple.  Lymphadenopathy:     Cervical: No cervical adenopathy.  Skin:    General: Skin is warm and dry.  Neurological:     Mental Status: She is alert and oriented to person, place, and time.  Psychiatric:        Mood and Affect: Mood normal.        Behavior: Behavior normal.        Thought Content: Thought content normal.        Judgment: Judgment normal.   BP 124/82  (BP Location: Left Arm, Patient Position: Sitting, Cuff Size: Large)   Pulse 83   Temp 98.1 F (36.7 C) (Oral)   Resp 18   Ht 5\' 6"  (1.676 m)   Wt 189 lb 3.2 oz (85.8 kg)   SpO2 95%   BMI 30.54 kg/m  Wt Readings from Last 3 Encounters:  09/16/23 189 lb 3.2 oz (85.8 kg)  09/12/23 192 lb (87.1 kg)  09/10/23 191 lb (86.6 kg)    Diabetic Foot Exam - Simple   No data filed    Lab Results  Component Value Date   WBC 3.3 (L) 09/16/2023   HGB 11.9 (L) 09/16/2023   HCT 36.2 09/16/2023   PLT 94.0 (L) 09/16/2023   GLUCOSE 98 09/16/2023   CHOL 179 06/14/2023   TRIG 94.0 06/14/2023   HDL 46.90 06/14/2023   LDLCALC 113 (H) 06/14/2023   ALT 13 09/16/2023   AST 18 09/16/2023   NA 140 09/16/2023   K 4.3 09/16/2023   CL 107 09/16/2023   CREATININE 0.63 09/16/2023   BUN 15 09/16/2023   CO2 27 09/16/2023   TSH 1.40 09/16/2023   INR 0.90 07/28/2018   HGBA1C 5.5 09/16/2023    Lab Results  Component Value Date   TSH 1.40 09/16/2023   Lab Results  Component Value Date   WBC 3.3 (L) 09/16/2023   HGB 11.9 (L) 09/16/2023   HCT 36.2 09/16/2023   MCV 93.1 09/16/2023   PLT 94.0 (L) 09/16/2023   Lab Results  Component Value Date   NA 140 09/16/2023   K 4.3 09/16/2023   CHLORIDE 108 12/31/2016   CO2 27 09/16/2023   GLUCOSE 98 09/16/2023   BUN 15 09/16/2023   CREATININE 0.63 09/16/2023   BILITOT 0.5 09/16/2023   ALKPHOS 56 09/16/2023   AST 18 09/16/2023   ALT 13 09/16/2023   PROT 7.2 09/16/2023   ALBUMIN 3.8 09/16/2023   CALCIUM 9.0 09/16/2023   ANIONGAP 13 09/12/2023   EGFR >90 12/31/2016   GFR 90.59 09/16/2023   Lab Results  Component Value Date   CHOL 179 06/14/2023   Lab Results  Component Value Date   HDL 46.90 06/14/2023   Lab Results  Component Value Date   LDLCALC 113 (H) 06/14/2023   Lab Results  Component Value Date   TRIG 94.0 06/14/2023   Lab Results  Component Value Date   CHOLHDL 4 06/14/2023   Lab Results  Component Value Date   HGBA1C  5.5 09/16/2023       Assessment & Plan:  Chronic combined systolic and diastolic congestive heart failure (HCC) Assessment & Plan: No recent exacerbation, follows with cardiology   Diarrhea, unspecified type Assessment & Plan: Hydrate and monitor   Orders: -     Clostridium Difficile by PCR  Hyperglycemia Assessment & Plan: hgba1c acceptable, minimize simple carbs. Increase exercise as tolerated.   Orders: -     Comprehensive metabolic panel -     Hemoglobin A1c  Hyperlipidemia, unspecified hyperlipidemia type Assessment & Plan: Encourage heart healthy diet such as MIND or DASH diet, increase exercise, avoid trans fats, simple carbohydrates and processed foods, consider a krill or fish or flaxseed oil cap daily.    Primary hypertension Assessment & Plan: Well controlled, no changes to meds. Encouraged heart healthy diet such as the DASH diet and exercise as tolerated.    Orders: -     CBC with Differential/Platelet -     TSH  Hypomagnesemia Assessment & Plan: Supplement and monitor   Orders: -     Comprehensive metabolic panel -     Magnesium  Anemia, unspecified type -     Ferritin  Sore throat -     POC COVID-19 BinaxNow -     POCT rapid strep A -     POCT Influenza A/B  Colon cancer screening -     Cologuard  Other orders -     Famotidine; Take 1 tablet (20 mg total) by mouth at bedtime.  Dispense: 30 tablet; Refill: 2 -     Amoxicillin; Take 1 capsule (500 mg total) by mouth 3 (three) times daily for 10 days.  Dispense: 30 capsule; Refill: 0    Assessment and Plan    Chronic Diarrhea Persistent diarrhea since Friday with 3-5 episodes per day. No recent antibiotic use. History of intermittent C. diff. No bloody or black stools. No fever. -Check for C. diff. -Consider adding fiber supplement (Benefiber) to diet once C. diff is ruled out. -Continue current antiemetic regimen:Ondansetron in the morning and evening, Compazine  midday.  Dehydration Patient reports feeling weak and has difficulty keeping fluids down due to nausea and vomiting. -Order blood work to assess kidney function and hydration status. -Advise patient to continue trying to hydrate orally.  Sore Throat tests positive for strep started on Amoxicillin 500 mg po did New onset this morning. No associated fever, headache, cough, or heartburn. -Check for strep, flu, and COVID-19. -Continue Pantoprazole for potential acid reflux. -Add Famotidine in the evening for potential acid reflux.   Hypertension Patient is on Amlodipine 5mg , Losartan 50mg  twice daily, and Spironolactone 25mg  daily. Blood pressure readings are within acceptable range. -Continue current antihypertensive regimen.  General Health Maintenance -Consider Prevnar 20 and COVID-19 booster vaccines. -Follow up with GI specialist regarding chronic diarrhea. -Check Vitamin D levels. -Confirm date of last mammogram.         Danise Edge, MD

## 2023-09-17 ENCOUNTER — Inpatient Hospital Stay: Payer: 59

## 2023-09-17 ENCOUNTER — Ambulatory Visit: Payer: 59

## 2023-09-17 DIAGNOSIS — C9000 Multiple myeloma not having achieved remission: Secondary | ICD-10-CM | POA: Diagnosis not present

## 2023-09-17 DIAGNOSIS — R197 Diarrhea, unspecified: Secondary | ICD-10-CM | POA: Diagnosis not present

## 2023-09-17 DIAGNOSIS — E860 Dehydration: Secondary | ICD-10-CM

## 2023-09-17 DIAGNOSIS — Z5111 Encounter for antineoplastic chemotherapy: Secondary | ICD-10-CM | POA: Diagnosis not present

## 2023-09-17 DIAGNOSIS — Z79899 Other long term (current) drug therapy: Secondary | ICD-10-CM | POA: Diagnosis not present

## 2023-09-17 DIAGNOSIS — D801 Nonfamilial hypogammaglobulinemia: Secondary | ICD-10-CM

## 2023-09-17 DIAGNOSIS — Z5112 Encounter for antineoplastic immunotherapy: Secondary | ICD-10-CM | POA: Diagnosis not present

## 2023-09-17 DIAGNOSIS — C9002 Multiple myeloma in relapse: Secondary | ICD-10-CM

## 2023-09-17 DIAGNOSIS — D500 Iron deficiency anemia secondary to blood loss (chronic): Secondary | ICD-10-CM

## 2023-09-17 DIAGNOSIS — Z79620 Long term (current) use of immunosuppressive biologic: Secondary | ICD-10-CM | POA: Diagnosis not present

## 2023-09-17 LAB — CBC WITH DIFFERENTIAL (CANCER CENTER ONLY)
Abs Immature Granulocytes: 0.02 10*3/uL (ref 0.00–0.07)
Basophils Absolute: 0 10*3/uL (ref 0.0–0.1)
Basophils Relative: 1 %
Eosinophils Absolute: 0.1 10*3/uL (ref 0.0–0.5)
Eosinophils Relative: 2 %
HCT: 32.6 % — ABNORMAL LOW (ref 36.0–46.0)
Hemoglobin: 10.8 g/dL — ABNORMAL LOW (ref 12.0–15.0)
Immature Granulocytes: 1 %
Lymphocytes Relative: 13 %
Lymphs Abs: 0.5 10*3/uL — ABNORMAL LOW (ref 0.7–4.0)
MCH: 30.4 pg (ref 26.0–34.0)
MCHC: 33.1 g/dL (ref 30.0–36.0)
MCV: 91.8 fL (ref 80.0–100.0)
Monocytes Absolute: 0.5 10*3/uL (ref 0.1–1.0)
Monocytes Relative: 13 %
Neutro Abs: 2.6 10*3/uL (ref 1.7–7.7)
Neutrophils Relative %: 70 %
Platelet Count: 86 10*3/uL — ABNORMAL LOW (ref 150–400)
RBC: 3.55 MIL/uL — ABNORMAL LOW (ref 3.87–5.11)
RDW: 14.4 % (ref 11.5–15.5)
WBC Count: 3.6 10*3/uL — ABNORMAL LOW (ref 4.0–10.5)
nRBC: 0 % (ref 0.0–0.2)

## 2023-09-17 LAB — PROTEIN ELECTROPHORESIS, SERUM, WITH REFLEX
A/G Ratio: 0.9 (ref 0.7–1.7)
Albumin ELP: 3.3 g/dL (ref 2.9–4.4)
Alpha-1-Globulin: 0.3 g/dL (ref 0.0–0.4)
Alpha-2-Globulin: 0.9 g/dL (ref 0.4–1.0)
Beta Globulin: 1 g/dL (ref 0.7–1.3)
Gamma Globulin: 1.4 g/dL (ref 0.4–1.8)
Globulin, Total: 3.6 g/dL (ref 2.2–3.9)
M-Spike, %: 1 g/dL — ABNORMAL HIGH
SPEP Interpretation: 0
Total Protein ELP: 6.9 g/dL (ref 6.0–8.5)

## 2023-09-17 LAB — CMP (CANCER CENTER ONLY)
ALT: 11 U/L (ref 0–44)
AST: 16 U/L (ref 15–41)
Albumin: 4 g/dL (ref 3.5–5.0)
Alkaline Phosphatase: 44 U/L (ref 38–126)
Anion gap: 7 (ref 5–15)
BUN: 17 mg/dL (ref 8–23)
CO2: 27 mmol/L (ref 22–32)
Calcium: 9.2 mg/dL (ref 8.9–10.3)
Chloride: 107 mmol/L (ref 98–111)
Creatinine: 0.63 mg/dL (ref 0.44–1.00)
GFR, Estimated: >60 mL/min (ref >60)
Glucose, Bld: 153 mg/dL — ABNORMAL HIGH (ref 70–99)
Potassium: 3.6 mmol/L (ref 3.5–5.1)
Sodium: 141 mmol/L (ref 135–145)
Total Bilirubin: 0.5 mg/dL (ref <1.2)
Total Protein: 7.3 g/dL (ref 6.5–8.1)

## 2023-09-17 LAB — IMMUNOFIXATION REFLEX, SERUM
IgA: 35 mg/dL — ABNORMAL LOW (ref 87–352)
IgG (Immunoglobin G), Serum: 1841 mg/dL — ABNORMAL HIGH (ref 586–1602)
IgM (Immunoglobulin M), Srm: 45 mg/dL (ref 26–217)

## 2023-09-17 MED ORDER — HEPARIN SOD (PORK) LOCK FLUSH 100 UNIT/ML IV SOLN
500.0000 [IU] | Freq: Once | INTRAVENOUS | Status: AC
Start: 2023-09-17 — End: 2023-09-17
  Administered 2023-09-17: 500 [IU] via INTRAVENOUS

## 2023-09-17 MED ORDER — SODIUM CHLORIDE 0.9% FLUSH
10.0000 mL | INTRAVENOUS | Status: DC | PRN
  Administered 2023-09-17: 10 mL via INTRAVENOUS

## 2023-09-17 MED ORDER — SODIUM CHLORIDE 0.9% FLUSH: 10.0000 mL | Freq: Once | INTRAVENOUS | Status: DC | PRN

## 2023-09-17 MED ORDER — HEPARIN SOD (PORK) LOCK FLUSH 100 UNIT/ML IV SOLN: 500.0000 [IU] | Freq: Once | INTRAVENOUS | Status: DC | PRN

## 2023-09-17 NOTE — Progress Notes (Signed)
No treatment today d/t platelet count of 86 per order of Dr. Myna Hidalgo. Pt to return as scheduled on 09/24/23 per order of Dr. Myna Hidalgo.

## 2023-09-17 NOTE — Addendum Note (Signed)
Addended by: Margaretha Seeds on: 09/17/2023 10:26 AM   Modules accepted: Orders

## 2023-09-17 NOTE — Patient Instructions (Signed)

## 2023-09-18 DIAGNOSIS — I517 Cardiomegaly: Secondary | ICD-10-CM | POA: Diagnosis not present

## 2023-09-18 DIAGNOSIS — I3139 Other pericardial effusion (noninflammatory): Secondary | ICD-10-CM | POA: Diagnosis not present

## 2023-09-18 DIAGNOSIS — K529 Noninfective gastroenteritis and colitis, unspecified: Secondary | ICD-10-CM | POA: Diagnosis not present

## 2023-09-18 DIAGNOSIS — I110 Hypertensive heart disease with heart failure: Secondary | ICD-10-CM | POA: Diagnosis not present

## 2023-09-18 DIAGNOSIS — J9811 Atelectasis: Secondary | ICD-10-CM | POA: Diagnosis not present

## 2023-09-18 DIAGNOSIS — C9000 Multiple myeloma not having achieved remission: Secondary | ICD-10-CM | POA: Diagnosis not present

## 2023-09-18 DIAGNOSIS — J069 Acute upper respiratory infection, unspecified: Secondary | ICD-10-CM | POA: Diagnosis not present

## 2023-09-18 DIAGNOSIS — Z9049 Acquired absence of other specified parts of digestive tract: Secondary | ICD-10-CM | POA: Diagnosis not present

## 2023-09-18 DIAGNOSIS — R9431 Abnormal electrocardiogram [ECG] [EKG]: Secondary | ICD-10-CM | POA: Diagnosis not present

## 2023-09-18 DIAGNOSIS — Z95810 Presence of automatic (implantable) cardiac defibrillator: Secondary | ICD-10-CM | POA: Diagnosis not present

## 2023-09-18 DIAGNOSIS — I5042 Chronic combined systolic (congestive) and diastolic (congestive) heart failure: Secondary | ICD-10-CM | POA: Diagnosis not present

## 2023-09-18 DIAGNOSIS — Z950 Presence of cardiac pacemaker: Secondary | ICD-10-CM | POA: Diagnosis not present

## 2023-09-18 DIAGNOSIS — R079 Chest pain, unspecified: Secondary | ICD-10-CM | POA: Diagnosis not present

## 2023-09-18 DIAGNOSIS — R42 Dizziness and giddiness: Secondary | ICD-10-CM | POA: Diagnosis not present

## 2023-09-18 DIAGNOSIS — G4733 Obstructive sleep apnea (adult) (pediatric): Secondary | ICD-10-CM | POA: Diagnosis not present

## 2023-09-18 DIAGNOSIS — R0789 Other chest pain: Secondary | ICD-10-CM | POA: Diagnosis not present

## 2023-09-18 DIAGNOSIS — I480 Paroxysmal atrial fibrillation: Secondary | ICD-10-CM | POA: Diagnosis not present

## 2023-09-18 DIAGNOSIS — E785 Hyperlipidemia, unspecified: Secondary | ICD-10-CM | POA: Diagnosis not present

## 2023-09-18 DIAGNOSIS — I428 Other cardiomyopathies: Secondary | ICD-10-CM | POA: Diagnosis not present

## 2023-09-19 ENCOUNTER — Other Ambulatory Visit: Payer: Self-pay

## 2023-09-19 DIAGNOSIS — J069 Acute upper respiratory infection, unspecified: Secondary | ICD-10-CM | POA: Diagnosis not present

## 2023-09-19 DIAGNOSIS — R079 Chest pain, unspecified: Secondary | ICD-10-CM | POA: Diagnosis not present

## 2023-09-19 DIAGNOSIS — I110 Hypertensive heart disease with heart failure: Secondary | ICD-10-CM | POA: Diagnosis not present

## 2023-09-19 DIAGNOSIS — Z95810 Presence of automatic (implantable) cardiac defibrillator: Secondary | ICD-10-CM | POA: Diagnosis not present

## 2023-09-19 DIAGNOSIS — I5042 Chronic combined systolic (congestive) and diastolic (congestive) heart failure: Secondary | ICD-10-CM | POA: Diagnosis not present

## 2023-09-19 DIAGNOSIS — I480 Paroxysmal atrial fibrillation: Secondary | ICD-10-CM | POA: Diagnosis not present

## 2023-09-20 ENCOUNTER — Telehealth: Payer: Self-pay | Admitting: Family Medicine

## 2023-09-20 ENCOUNTER — Encounter: Payer: Self-pay | Admitting: *Deleted

## 2023-09-20 ENCOUNTER — Encounter: Payer: Self-pay | Admitting: Hematology & Oncology

## 2023-09-20 ENCOUNTER — Other Ambulatory Visit: Payer: Self-pay | Admitting: Family Medicine

## 2023-09-20 DIAGNOSIS — I517 Cardiomegaly: Secondary | ICD-10-CM | POA: Diagnosis not present

## 2023-09-20 DIAGNOSIS — R0600 Dyspnea, unspecified: Secondary | ICD-10-CM | POA: Diagnosis not present

## 2023-09-20 DIAGNOSIS — R079 Chest pain, unspecified: Secondary | ICD-10-CM | POA: Diagnosis not present

## 2023-09-20 DIAGNOSIS — Z950 Presence of cardiac pacemaker: Secondary | ICD-10-CM | POA: Diagnosis not present

## 2023-09-20 MED ORDER — HYDROCODONE BIT-HOMATROP MBR 5-1.5 MG/5ML PO SOLN: 5.0000 mL | Freq: Three times a day (TID) | ORAL | 0 refills | Status: DC | PRN

## 2023-09-20 NOTE — Telephone Encounter (Signed)
Followed up with [name]. She was admitted to the hospital os Saturday 09/18/23. When I spoke to her she will be discharged today(09/20/23). She is requesting more cough syrup for her congestion

## 2023-09-20 NOTE — Telephone Encounter (Signed)
Pt called and requested for a prescription for cough syrup to be sent in to her pharmacy as she stated that the antibiotic Dr. Abner Greenspan prescribed has been working but symptoms of coughing and congestion still remain. Please call and advise.

## 2023-09-20 NOTE — Telephone Encounter (Signed)
Initial Comment Caller states that she would like to leave a message to her doctor. She is having tightness in the chest and coughing. Translation No Nurse Assessment Nurse: Konrad Dolores, RN, [place] Date/Time (Eastern Time): 09/18/2023 8:03:49 PM Confirm and document reason for call. If symptomatic, describe symptoms. ---Caller states dz with strep throat Thursday. Today cough is much worse and now if she having chest pain tightness, does not increase with inspiration or cough. No thermometer, but feels warm Does the patient have any new or worsening symptoms? ---Yes Will a triage be completed? ---Yes Related visit to physician within the last 2 weeks? ---Yes Does the PT have any chronic conditions? (i.e. diabetes, asthma, this includes High risk factors for pregnancy, etc.) ---Yes List chronic conditions. ---CHF, Pacemaker, Multiple Myeloma, HTN Is this a behavioral health or substance abuse call? ---No Guidelines Guideline Title Affirmed Question Affirmed Notes Nurse Date/Time Lamount Cohen Time) Chest Pain [1] Chest pain lasts > 5 minutes AND [2] age > 45 Konrad Dolores, RN, [place] Blas 09/18/2023 8:07:37 PM Disp. Time Lamount Cohen Time) Disposition Final User 09/18/2023 8:02:15 PM Send to Urgent Damita Dunnings 09/18/2023 8:09:06 PM Call EMS 911 Now Yes Konrad Dolores, RN, [place] Blas PLEASE NOTE: All timestamps contained within this report are represented as [place] Standard Time. CONFIDENTIALTY NOTICE: This fax transmission is intended only for the addressee. It contains information that is legally privileged, confidential or otherwise protected from use or disclosure. If you are not the intended recipient, you are strictly prohibited from reviewing, disclosing, copying using or disseminating any of this information or taking any action in reliance on or regarding this information. If you have received this fax in error, please notify us immediately by telephone so that we can arrange for its return to [place]. Phone:  [phone], Toll-Free: [phone], Fax: [phone] Page: 2 of 2 Call Id: [id number] Disp. Time Lamount Cohen Time) Disposition Final User 09/18/2023 8:15:30 PM 911 Outcome Documentation Konrad Dolores RN, Fonda Blas Reason: voicemail Final Disposition 09/18/2023 8:09:06 PM Call EMS 911 Now Yes Konrad Dolores, RN, [place] Blas Caller Disagree/Comply Comply Caller Understands Yes PreDisposition InappropriateToAsk Care Advice Given Per Guideline CALL EMS 911 NOW: * Immediate medical attention is needed. You need to hang up and call 911 (or an ambulance). CARE ADVICE given per Chest Pain (Adult) guideline

## 2023-09-21 DIAGNOSIS — Z9989 Dependence on other enabling machines and devices: Secondary | ICD-10-CM | POA: Diagnosis not present

## 2023-09-21 DIAGNOSIS — G4733 Obstructive sleep apnea (adult) (pediatric): Secondary | ICD-10-CM | POA: Diagnosis not present

## 2023-09-21 NOTE — Telephone Encounter (Signed)
Called Tami [name]'s legal guardian and left voicemail informing them her request for cough syrup was done

## 2023-09-22 ENCOUNTER — Ambulatory Visit: Payer: 59 | Admitting: Family Medicine

## 2023-09-22 ENCOUNTER — Ambulatory Visit (HOSPITAL_BASED_OUTPATIENT_CLINIC_OR_DEPARTMENT_OTHER)
Admission: RE | Admit: 2023-09-22 | Discharge: 2023-09-22 | Disposition: A | Discharge status: Home / Self Care | Payer: 59 | Source: Ambulatory Visit | Attending: Family Medicine | Admitting: Family Medicine

## 2023-09-22 ENCOUNTER — Ambulatory Visit: Payer: 59 | Attending: Sports Medicine | Admitting: Physical Therapy

## 2023-09-22 ENCOUNTER — Encounter: Payer: Self-pay | Admitting: Family Medicine

## 2023-09-22 VITALS — BP 137/85 | HR 89 | Ht 66.0 in | Wt 189.0 lb

## 2023-09-22 DIAGNOSIS — R519 Headache, unspecified: Secondary | ICD-10-CM | POA: Diagnosis not present

## 2023-09-22 DIAGNOSIS — I10 Essential (primary) hypertension: Secondary | ICD-10-CM

## 2023-09-22 DIAGNOSIS — Z09 Encounter for follow-up examination after completed treatment for conditions other than malignant neoplasm: Secondary | ICD-10-CM

## 2023-09-22 LAB — COMPREHENSIVE METABOLIC PANEL
ALT: 12 U/L (ref 0–35)
AST: 19 U/L (ref 0–37)
Albumin: 3.7 g/dL (ref 3.5–5.2)
Alkaline Phosphatase: 53 U/L (ref 39–117)
BUN: 17 mg/dL (ref 6–23)
CO2: 25 meq/L (ref 19–32)
Calcium: 8.7 mg/dL (ref 8.4–10.5)
Chloride: 108 meq/L (ref 96–112)
Creatinine, Ser: 0.58 mg/dL (ref 0.40–1.20)
GFR: 92.41 mL/min (ref >60.00)
Glucose, Bld: 93 mg/dL (ref 70–99)
Potassium: 4.2 meq/L (ref 3.5–5.1)
Sodium: 141 meq/L (ref 135–145)
Total Bilirubin: 0.3 mg/dL (ref 0.2–1.2)
Total Protein: 6.8 g/dL (ref 6.0–8.3)

## 2023-09-22 LAB — CBC WITH DIFFERENTIAL/PLATELET
Basophils Absolute: 0 10*3/uL (ref 0.0–0.1)
Basophils Relative: 1.2 % (ref 0.0–3.0)
Eosinophils Absolute: 0.1 10*3/uL (ref 0.0–0.7)
Eosinophils Relative: 3.9 % (ref 0.0–5.0)
HCT: 35.4 % — ABNORMAL LOW (ref 36.0–46.0)
Hemoglobin: 11.4 g/dL — ABNORMAL LOW (ref 12.0–15.0)
Lymphocytes Relative: 24.9 % (ref 12.0–46.0)
Lymphs Abs: 0.7 10*3/uL (ref 0.7–4.0)
MCHC: 32.2 g/dL (ref 30.0–36.0)
MCV: 93.3 fL (ref 78.0–100.0)
Monocytes Absolute: 0.3 10*3/uL (ref 0.1–1.0)
Monocytes Relative: 11.3 % (ref 3.0–12.0)
Neutro Abs: 1.6 10*3/uL (ref 1.4–7.7)
Neutrophils Relative %: 58.7 % (ref 43.0–77.0)
Platelets: 170 10*3/uL (ref 150.0–400.0)
RBC: 3.79 Mil/uL — ABNORMAL LOW (ref 3.87–5.11)
RDW: 15.4 % (ref 11.5–15.5)
WBC: 2.8 10*3/uL — ABNORMAL LOW (ref 4.0–10.5)

## 2023-09-22 LAB — MAGNESIUM: Magnesium: 2 mg/dL (ref 1.5–2.5)

## 2023-09-22 NOTE — Progress Notes (Signed)
Acute Office Visit  Subjective:     [name], female    DOB: 09/14/1956, 67 y.o.   MRN: [id number]  Chief Complaint  Patient presents with   Hospitalization Follow-up    HPI Patient is in today for hospital follow-up.   Discussed the use of AI scribe software for clinical note transcription with the patient, who gave verbal consent to proceed.  History of Present Illness   The patient, recently hospitalized from November 9th to 11th, presented for a follow-up appointment. They had been experiencing chest pain, which was attributed to inflammation between the ribs due to excessive coughing from an upper respiratory infection. Cardiac workup was performed during the hospital stay and was found to be stable. The patient was also diagnosed with strep throat and was prescribed amoxicillin, which they have been taking since the previous Thursday.  The patient reported that their coughing has improved since starting a prescribed cough syrup, and they are now able to expectorate. However, they still experience some fatigue and chest discomfort due to the persistent cough.  The patient's blood work during the hospital stay revealed a low magnesium level of 1.7, for which they received intravenous supplementation. They were advised to have their magnesium levels rechecked after discharge. - Hospitalist at Home program was set up, but patient states they cancelled it since she had this appointment scheduled, so we will update labs today.   The patient has been experiencing a severe headache, rating it as a 9 out of 10 in intensity. The headache has been persistent for almost a week, even before their hospital admission. The pain is generalized, affecting the entire head, and is not relieved by taking up to six Tylenol tablets daily. The patient denied any sensitivity to light or sound, vision changes, slurred speech, or weakness on one side of the body.  They have a history of  sleep apnea managed by a neurologist. The patient expressed a desire for a CT scan due to the severity and persistence of their headache. Their last CT scan was performed six months prior in May and was stable.         ROS All review of systems negative except what is listed in the HPI      Objective:    BP 137/85   Pulse 89   Ht 5\' 6"  (1.676 m)   Wt 189 lb (85.7 kg)   SpO2 99%   BMI 30.51 kg/m    Physical Exam Vitals reviewed.  Constitutional:      General: She is not in acute distress.    Appearance: Normal appearance. She is not ill-appearing.  HENT:     Mouth/Throat:     Pharynx: No oropharyngeal exudate or posterior oropharyngeal erythema.  Eyes:     Extraocular Movements: Extraocular movements intact.     Conjunctiva/sclera: Conjunctivae normal.     Pupils: Pupils are equal, round, and reactive to light.  Cardiovascular:     Rate and Rhythm: Normal rate and regular rhythm.  Pulmonary:     Effort: Pulmonary effort is normal.     Breath sounds: Normal breath sounds.  Skin:    General: Skin is warm and dry.  Neurological:     General: No focal deficit present.     Mental Status: She is alert and oriented to person, place, and time.     Cranial Nerves: No cranial nerve deficit.     Motor: No weakness.     Coordination: Coordination  normal.     Gait: Gait normal.  Psychiatric:        Mood and Affect: Mood normal.        Behavior: Behavior normal.        Thought Content: Thought content normal.        Judgment: Judgment normal.         No results found for any visits on 09/22/23.      Assessment & Plan:   Problem List Items Addressed This Visit       Active Problems   Hypertension   Relevant Medications   amLODipine (NORVASC) 5 MG tablet   Other Relevant Orders   CBC with Differential/Platelet   Comprehensive metabolic panel   Hypomagnesemia   Relevant Orders   Magnesium   Other Visit Diagnoses     Severe headache    -  Primary    Relevant Medications   amLODipine (NORVASC) 5 MG tablet   Other Relevant Orders   CBC with Differential/Platelet   Comprehensive metabolic panel   Magnesium   CT HEAD WO CONTRAST ( )   Hospital discharge follow-up       Relevant Orders   CBC with Differential/Platelet   Comprehensive metabolic panel   Magnesium         Chest Pain Recent hospitalization for chest pain, attributed to costochondritis secondary to coughing from upper respiratory infection. Cardiac workup stable. -Continue current management. Routine follow-up already scheduled with regular cardiologist.   Upper Respiratory Infection/Strep Throat Improvement noted with amoxicillin and cough syrup. -Complete full course of amoxicillin. -Continue cough syrup as needed. -Continue supportive measures including rest, hydration, humidifier use, steam showers, warm compresses to sinuses, warm liquids with lemon and honey, and over-the-counter cough, cold, and analgesics as needed.   Hypomagnesemia Recent hospitalization noted low magnesium levels (1.7). -Check magnesium level today.  Severe Daily Headache Persistent headache despite treatment with Tylenol. No vision changes, slurred speech, or weakness noted. Last CT scan in May was normal. -Order CT scan of head if insurance approves. -If headache persists after resolution of respiratory infection, consider referral back to neurologist. -Consider reducing Tylenol use to avoid potential rebound headaches.      No orders of the defined types were placed in this encounter.   Return if symptoms worsen or fail to improve.  Clayborne Dana, NP

## 2023-09-23 ENCOUNTER — Ambulatory Visit: Payer: Self-pay

## 2023-09-23 NOTE — Patient Outreach (Signed)
  Care Coordination   Follow Up Visit Note   09/23/2023 Name: Tami [name] MRN: [id number] DOB: 21-Oct-1956  Tami [name] is a 67 y.o. year old female who sees [name], MD for primary care. I spoke with  Tami [name] by phone today.  What matters to the patients health and wellness today?  RNCM contacted Tami [name] who request RNCM contact patient at this time for telephone visit. Tami [name] reports diarrhea is improving(off and on). She states she feels much better following infusion. She has an appointment with GI provider on 10/25/23. Patient has followed up with Primary provider on 09/16/23 and 09/22/23. She reports she was diagnosed with strep throat and is taking antibiotics as prescribed. Tami [name] reports she is being followed closely by providers regarding her health conditions. She states that she is not intrested in changing providers at this time and is satisfied with current providers. Tami [name] states, "I am feeling a whole lot better. She denies any questions or concerns and states she and/or Tami [name] will contact RNCM if case management needs in the future.  Goals Addressed             This Visit's Progress    COMPLETED: Assistance with Health Management       Interventions Today    Flowsheet Row Most Recent Value  Chronic Disease   Chronic disease during today's visit Other  [chronic diarrhea, multiple myoloma]  General Interventions   General Interventions Discussed/Reviewed General Interventions Reviewed, Doctor Visits  [Evaluation of current treatment plan for health condition and patient's adherence to plan. encouraged to contact RNCM if care mangement needs in the future.]  Doctor Visits Discussed/Reviewed Doctor Visits Reviewed, PCP, Specialist  PCP/Specialist Visits Compliance with follow-up visit  [reviewed upcoming/scheduled appointments]  Education Interventions   Provided Verbal Education On When to see the doctor, Nutrition, Medication   [advised to continue to take medications as prescribed, attend provider visits as recommended, remain hydrated, contact provider with health questions or concerns]  Pharmacy Interventions   Pharmacy Dicussed/Reviewed Pharmacy Topics Discussed  [provider reviewed on yesterday 11/13 at office visit. patient declines to review at this time.]            SDOH assessments and interventions completed:  No  Care Coordination Interventions:  Yes, provided   Follow up plan: No further intervention required.   Encounter Outcome:  Patient Visit Completed   Kathyrn Sheriff, RN, MSN, BSN, CCM Care Management Coordinator [phone]

## 2023-09-23 NOTE — Patient Instructions (Signed)
Visit Information  Thank you for taking time to visit with me today. Please don't hesitate to contact me if I can be of assistance to you.   Following are the goals we discussed today:  Continue to take medications as prescribed. Continue to attend provider visits as scheduled Continue to eat healthy, lean meats, vegetables, fruits, avoid saturated and transfats Contact provider with health questions or concerns as needed  If you are experiencing a Mental Health or Behavioral Health Crisis or need someone to talk to, please call the Suicide and Crisis Lifeline: 988 call the [place] National Suicide Prevention Lifeline: 214-092-2609 or TTY: 430-749-8448 TTY 564-720-8205) to talk to a trained counselor call 1-800-273-TALK (toll free, 24 hour hotline)  Kathyrn Sheriff, RN, MSN, BSN, CCM Care Management Coordinator [phone]

## 2023-09-24 ENCOUNTER — Inpatient Hospital Stay: Payer: 59

## 2023-09-24 VITALS — BP 153/85 | HR 88 | Temp 97.7°F | Resp 18 | Wt 191.0 lb

## 2023-09-24 DIAGNOSIS — R197 Diarrhea, unspecified: Secondary | ICD-10-CM | POA: Diagnosis not present

## 2023-09-24 DIAGNOSIS — C9002 Multiple myeloma in relapse: Secondary | ICD-10-CM

## 2023-09-24 DIAGNOSIS — Z79899 Other long term (current) drug therapy: Secondary | ICD-10-CM | POA: Diagnosis not present

## 2023-09-24 DIAGNOSIS — Z5112 Encounter for antineoplastic immunotherapy: Secondary | ICD-10-CM | POA: Diagnosis not present

## 2023-09-24 DIAGNOSIS — C9000 Multiple myeloma not having achieved remission: Secondary | ICD-10-CM

## 2023-09-24 DIAGNOSIS — Z5111 Encounter for antineoplastic chemotherapy: Secondary | ICD-10-CM | POA: Diagnosis not present

## 2023-09-24 DIAGNOSIS — Z79620 Long term (current) use of immunosuppressive biologic: Secondary | ICD-10-CM | POA: Diagnosis not present

## 2023-09-24 LAB — CMP (CANCER CENTER ONLY)
ALT: 12 U/L (ref 0–44)
AST: 20 U/L (ref 15–41)
Albumin: 3.9 g/dL (ref 3.5–5.0)
Alkaline Phosphatase: 49 U/L (ref 38–126)
Anion gap: 8 (ref 5–15)
BUN: 15 mg/dL (ref 8–23)
CO2: 28 mmol/L (ref 22–32)
Calcium: 9.3 mg/dL (ref 8.9–10.3)
Chloride: 107 mmol/L (ref 98–111)
Creatinine: 0.58 mg/dL (ref 0.44–1.00)
GFR, Estimated: >60 mL/min (ref >60)
Glucose, Bld: 106 mg/dL — ABNORMAL HIGH (ref 70–99)
Potassium: 3.4 mmol/L — ABNORMAL LOW (ref 3.5–5.1)
Sodium: 143 mmol/L (ref 135–145)
Total Bilirubin: 0.3 mg/dL (ref <1.2)
Total Protein: 7.1 g/dL (ref 6.5–8.1)

## 2023-09-24 LAB — CBC WITH DIFFERENTIAL (CANCER CENTER ONLY)
Abs Immature Granulocytes: 0.01 10*3/uL (ref 0.00–0.07)
Basophils Absolute: 0 10*3/uL (ref 0.0–0.1)
Basophils Relative: 1 %
Eosinophils Absolute: 0.1 10*3/uL (ref 0.0–0.5)
Eosinophils Relative: 5 %
HCT: 32.2 % — ABNORMAL LOW (ref 36.0–46.0)
Hemoglobin: 10.7 g/dL — ABNORMAL LOW (ref 12.0–15.0)
Immature Granulocytes: 0 %
Lymphocytes Relative: 23 %
Lymphs Abs: 0.7 10*3/uL (ref 0.7–4.0)
MCH: 30.7 pg (ref 26.0–34.0)
MCHC: 33.2 g/dL (ref 30.0–36.0)
MCV: 92.3 fL (ref 80.0–100.0)
Monocytes Absolute: 0.3 10*3/uL (ref 0.1–1.0)
Monocytes Relative: 9 %
Neutro Abs: 1.8 10*3/uL (ref 1.7–7.7)
Neutrophils Relative %: 62 %
Platelet Count: 159 10*3/uL (ref 150–400)
RBC: 3.49 MIL/uL — ABNORMAL LOW (ref 3.87–5.11)
RDW: 14 % (ref 11.5–15.5)
WBC Count: 2.9 10*3/uL — ABNORMAL LOW (ref 4.0–10.5)
nRBC: 0 % (ref 0.0–0.2)

## 2023-09-24 MED ORDER — SODIUM CHLORIDE 0.9 % IV SOLN
INTRAVENOUS | Status: AC
Start: 2023-09-24 — End: 2023-09-25

## 2023-09-24 MED ORDER — SODIUM CHLORIDE 0.9 % IV SOLN: Freq: Once | INTRAVENOUS | Status: DC

## 2023-09-24 MED ORDER — SODIUM CHLORIDE 0.9 % IV SOLN
20.0000 mg | Freq: Once | INTRAVENOUS | Status: AC
  Administered 2023-09-24: 20 mg via INTRAVENOUS
  Filled 2023-09-24: qty 20

## 2023-09-24 MED ORDER — DEXTROSE 5 % IV SOLN
56.0000 mg/m2 | Freq: Once | INTRAVENOUS | Status: AC
  Administered 2023-09-24: 110 mg via INTRAVENOUS
  Filled 2023-09-24: qty 30

## 2023-09-24 MED ORDER — SODIUM CHLORIDE 0.9 % IV SOLN
10.0000 mg/kg | Freq: Once | INTRAVENOUS | Status: AC
  Administered 2023-09-24: 900 mg via INTRAVENOUS
  Filled 2023-09-24: qty 25

## 2023-09-24 MED ORDER — HEPARIN SOD (PORK) LOCK FLUSH 100 UNIT/ML IV SOLN
500.0000 [IU] | Freq: Once | INTRAVENOUS | Status: AC | PRN
  Administered 2023-09-24: 500 [IU]

## 2023-09-24 MED ORDER — SODIUM CHLORIDE 0.9 % IV SOLN
300.0000 mg/m2 | Freq: Once | INTRAVENOUS | Status: AC
  Administered 2023-09-24: 600 mg via INTRAVENOUS
  Filled 2023-09-24: qty 30

## 2023-09-24 MED ORDER — ACETAMINOPHEN 325 MG PO TABS: 650.0000 mg | ORAL_TABLET | Freq: Once | ORAL | Status: DC

## 2023-09-24 MED ORDER — SODIUM CHLORIDE 0.9 % IV SOLN: Freq: Once | INTRAVENOUS | Status: AC

## 2023-09-24 MED ORDER — SODIUM CHLORIDE 0.9% FLUSH
10.0000 mL | INTRAVENOUS | Status: DC | PRN
  Administered 2023-09-24: 10 mL

## 2023-09-24 MED ORDER — POTASSIUM CHLORIDE 10 MEQ/100ML IV SOLN
10.0000 meq | INTRAVENOUS | Status: AC
  Administered 2023-09-24 (×3): 10 meq via INTRAVENOUS
  Filled 2023-09-24: qty 100

## 2023-09-24 MED ORDER — DIPHENHYDRAMINE HCL 50 MG/ML IJ SOLN
50.0000 mg | Freq: Once | INTRAMUSCULAR | Status: AC
  Administered 2023-09-24: 50 mg via INTRAVENOUS
  Filled 2023-09-24: qty 1

## 2023-09-24 MED ORDER — FAMOTIDINE IN NACL 20-0.9 MG/50ML-% IV SOLN
20.0000 mg | Freq: Once | INTRAVENOUS | Status: AC
  Administered 2023-09-24: 20 mg via INTRAVENOUS
  Filled 2023-09-24: qty 50

## 2023-09-24 NOTE — Progress Notes (Unsigned)
Pt. arrived for chemo today. K+3.4. Pt. Was in ED for headache/diarrhea/sorethroat and Sob on Saturday. Pt. Stated she also had a CT of head this past Wednesday ordered by PCP. Had daily headache x 2 weeks.  Arrived stable reported 4 episodes of diarrhea over last 24 hrs.  Dr. Bea Laura notified of above, extra IVF and K+infusion ordered along with chemo today.  Pt. Settled in chair, stable.

## 2023-09-24 NOTE — Patient Instructions (Signed)
[facility] - A DEPT OF [facility]  Discharge Instructions: Thank you for choosing [facility] to provide your oncology and hematology care.   If you have a lab appointment with the Cancer Center, please go directly to the Cancer Center and check in at the registration area.  Wear comfortable clothing and clothing appropriate for easy access to any Portacath or PICC line.   We strive to give you quality time with your provider. You may need to reschedule your appointment if you arrive late (15 or more minutes).  Arriving late affects you and other patients whose appointments are after yours.  Also, if you miss three or more appointments without notifying the office, you may be dismissed from the clinic at the provider's discretion.   car   For prescription refill requests, have your pharmacy contact our office and allow 72 hours for refills to be completed.    Today you received the following chemotherapy and/or immunotherapy agents Carfilzomib, Isatuximab, Cytoxan, potassium and fluids      To help prevent nausea and vomiting after your treatment, we encourage you to take your nausea medication as directed.  BELOW ARE SYMPTOMS THAT SHOULD BE REPORTED IMMEDIATELY: *FEVER GREATER THAN 100.4 F (38 C) OR HIGHER *CHILLS OR SWEATING *NAUSEA AND VOMITING THAT IS NOT CONTROLLED WITH YOUR NAUSEA MEDICATION *UNUSUAL SHORTNESS OF BREATH *UNUSUAL BRUISING OR BLEEDING *URINARY PROBLEMS (pain or burning when urinating, or frequent urination) *BOWEL PROBLEMS (unusual diarrhea, constipation, pain near the anus) TENDERNESS IN MOUTH AND THROAT WITH OR WITHOUT PRESENCE OF ULCERS (sore throat, sores in mouth, or a toothache) UNUSUAL RASH, SWELLING OR PAIN  UNUSUAL VAGINAL DISCHARGE OR ITCHING   Items with * indicate a potential emergency and should be followed up as soon as possible or go to the Emergency Department if any problems should occur.  Please  show the CHEMOTHERAPY ALERT CARD or IMMUNOTHERAPY ALERT CARD at check-in to the Emergency Department and triage nurse. Should you have questions after your visit or need to cancel or reschedule your appointment, please contact [facility] - A DEPT OF [facility]  [phone] and follow the prompts.  Office hours are 8:00 a.m. to 4:30 p.m. Monday - Friday. Please note that voicemails left after 4:00 p.m. may not be returned until the following business day.  We are closed weekends and major holidays. You have access to a nurse at all times for urgent questions. Please call the main number to the clinic [phone] and follow the prompts.  For any non-urgent questions, you may also contact your provider using MyChart. We now offer e-Visits for anyone 55 and older to request care online for non-urgent symptoms. For details visit [email or web address].   Also download the MyChart app! Go to the app store, search "MyChart", open the app, select [facility], and log in with your MyChart username and password.

## 2023-10-01 DIAGNOSIS — Z7960 Long term (current) use of unspecified immunomodulators and immunosuppressants: Secondary | ICD-10-CM | POA: Diagnosis not present

## 2023-10-01 DIAGNOSIS — R1013 Epigastric pain: Secondary | ICD-10-CM | POA: Diagnosis not present

## 2023-10-01 DIAGNOSIS — K529 Noninfective gastroenteritis and colitis, unspecified: Secondary | ICD-10-CM | POA: Diagnosis not present

## 2023-10-01 DIAGNOSIS — R197 Diarrhea, unspecified: Secondary | ICD-10-CM | POA: Diagnosis not present

## 2023-10-01 DIAGNOSIS — C9000 Multiple myeloma not having achieved remission: Secondary | ICD-10-CM | POA: Diagnosis not present

## 2023-10-05 ENCOUNTER — Inpatient Hospital Stay: Payer: 59

## 2023-10-05 VITALS — BP 144/73 | HR 72 | Temp 98.8°F | Resp 20

## 2023-10-05 DIAGNOSIS — R197 Diarrhea, unspecified: Secondary | ICD-10-CM | POA: Diagnosis not present

## 2023-10-05 DIAGNOSIS — Z5112 Encounter for antineoplastic immunotherapy: Secondary | ICD-10-CM | POA: Diagnosis not present

## 2023-10-05 DIAGNOSIS — Z79620 Long term (current) use of immunosuppressive biologic: Secondary | ICD-10-CM | POA: Diagnosis not present

## 2023-10-05 DIAGNOSIS — E860 Dehydration: Secondary | ICD-10-CM

## 2023-10-05 DIAGNOSIS — Z79899 Other long term (current) drug therapy: Secondary | ICD-10-CM | POA: Diagnosis not present

## 2023-10-05 DIAGNOSIS — C9000 Multiple myeloma not having achieved remission: Secondary | ICD-10-CM

## 2023-10-05 DIAGNOSIS — Z5111 Encounter for antineoplastic chemotherapy: Secondary | ICD-10-CM | POA: Diagnosis not present

## 2023-10-05 LAB — CBC WITH DIFFERENTIAL (CANCER CENTER ONLY)
Abs Immature Granulocytes: 0.01 10*3/uL (ref 0.00–0.07)
Basophils Absolute: 0 10*3/uL (ref 0.0–0.1)
Basophils Relative: 1 %
Eosinophils Absolute: 0.1 10*3/uL (ref 0.0–0.5)
Eosinophils Relative: 4 %
HCT: 33.7 % — ABNORMAL LOW (ref 36.0–46.0)
Hemoglobin: 11 g/dL — ABNORMAL LOW (ref 12.0–15.0)
Immature Granulocytes: 0 %
Lymphocytes Relative: 21 %
Lymphs Abs: 0.6 10*3/uL — ABNORMAL LOW (ref 0.7–4.0)
MCH: 30.1 pg (ref 26.0–34.0)
MCHC: 32.6 g/dL (ref 30.0–36.0)
MCV: 92.3 fL (ref 80.0–100.0)
Monocytes Absolute: 0.3 10*3/uL (ref 0.1–1.0)
Monocytes Relative: 10 %
Neutro Abs: 2 10*3/uL (ref 1.7–7.7)
Neutrophils Relative %: 64 %
Platelet Count: 140 10*3/uL — ABNORMAL LOW (ref 150–400)
RBC: 3.65 MIL/uL — ABNORMAL LOW (ref 3.87–5.11)
RDW: 13.9 % (ref 11.5–15.5)
WBC Count: 3.1 10*3/uL — ABNORMAL LOW (ref 4.0–10.5)
nRBC: 0 % (ref 0.0–0.2)

## 2023-10-05 LAB — CMP (CANCER CENTER ONLY)
ALT: 12 U/L (ref 0–44)
AST: 19 U/L (ref 15–41)
Albumin: 3.6 g/dL (ref 3.5–5.0)
Alkaline Phosphatase: 49 U/L (ref 38–126)
Anion gap: 9 (ref 5–15)
BUN: 17 mg/dL (ref 8–23)
CO2: 26 mmol/L (ref 22–32)
Calcium: 9.2 mg/dL (ref 8.9–10.3)
Chloride: 107 mmol/L (ref 98–111)
Creatinine: 0.59 mg/dL (ref 0.44–1.00)
GFR, Estimated: >60 mL/min (ref >60)
Glucose, Bld: 161 mg/dL — ABNORMAL HIGH (ref 70–99)
Potassium: 3.5 mmol/L (ref 3.5–5.1)
Sodium: 142 mmol/L (ref 135–145)
Total Bilirubin: 0.4 mg/dL (ref <1.2)
Total Protein: 6.6 g/dL (ref 6.5–8.1)

## 2023-10-05 MED ORDER — SODIUM CHLORIDE 0.9 % IV SOLN
INTRAVENOUS | Status: DC
Start: 2023-10-05 — End: 2023-10-05

## 2023-10-05 NOTE — Patient Instructions (Signed)
Dehydration, Adult Dehydration is a condition in which there is not enough water or other fluids in the body. This happens when a person loses more fluids than they take in. Important organs cannot work right without the right amount of fluids. Any loss of fluids from the body can cause dehydration. Dehydration can be mild, worse, or very bad. It should be treated right away to keep it from getting very bad. What are the causes? Conditions that cause loss of water in the body. They include: Watery poop (diarrhea). Vomiting. Sweating a lot. Fever. Infection. Peeing (urinating) a lot. Not drinking enough fluids. Certain medicines, such as medicines that take extra fluid out of the body (diuretics). Lack of safe drinking water. Not being able to get enough water and food. What increases the risk? Having a long-term (chronic) illness that has not been treated the right way, such as: Diabetes. Heart disease. Kidney disease. Being 65 years of age or older. Having a disability. Living in a place that is high above the ground or sea (high in altitude). The thinner, drier air causes more fluid loss. Doing exercises that put stress on your body for a long time. Being active when in hot places. What are the signs or symptoms? Symptoms of dehydration depend on how bad it is. Mild or worse dehydration Thirst. Dry lips or dry mouth. Feeling dizzy or light-headed. Muscle cramps. Passing little pee or dark pee. Pee may be the color of tea. Headache. Very bad dehydration Changes in skin. Skin may: Be cold to the touch (clammy). Be blotchy or pale. Not go back to normal right after you pinch it and let it go. Little or no tears, pee, or sweat. Fast breathing. Low blood pressure. Weak pulse. Pulse that is more than 100 beats a minute when you are sitting still. Other changes, such as: Feeling very thirsty. Eyes that look hollow (sunken). Cold hands and feet. Being confused. Being very  tired (lethargic) or having trouble waking from sleep. Losing weight. Loss of consciousness. How is this treated? Treatment for this condition depends on how bad your dehydration is. Treatment should start right away. Do not wait until your condition gets very bad. Very bad dehydration is an emergency. You will need to go to a hospital. Mild or worse dehydration can be treated at home. You may be asked to: Drink more fluids. Drink an oral rehydration solution (ORS). This drink gives you the right amount of fluids, salts, and minerals (electrolytes). Very bad dehydration can be treated: With fluids through an IV tube. By correcting low levels of electrolytes in the body. By treating the problem that caused your dehydration. Follow these instructions at home: Oral rehydration solution If told by your doctor, drink an ORS: Make an ORS. Use instructions on the package. Start by drinking small amounts, about  cup (120 mL) every 5-10 minutes. Slowly drink more until you have had the amount that your doctor said to have.  Eating and drinking  Drink enough clear fluid to keep your pee pale yellow. If you were told to drink an ORS, finish the ORS first. Then, start slowly drinking other clear fluids. Drink fluids such as: Water. Do not drink only water. Doing that can make the salt (sodium) level in your body get too low. Water from ice chips you suck on. Fruit juice that you have added water to (diluted). Low-calorie sports drinks. Eat foods that have the right amounts of salts and minerals, such as bananas, oranges, potatoes,   tomatoes, or spinach. Do not drink alcohol. Avoid drinks that have caffeine or sugar. These include:: High-calorie sports drinks. Fruit juice that you did not add water to. Soda. Coffee or energy drinks. Avoid foods that are greasy or have a lot of fat or sugar. General instructions Take over-the-counter and prescription medicines only as told by your doctor. Do  not take sodium tablets. Doing that can make the salt level in your body get too high. Return to your normal activities as told by your doctor. Ask your doctor what activities are safe for you. Keep all follow-up visits. Your doctor may check and change your treatment. Contact a doctor if: You have pain in your belly (abdomen) and the pain: Gets worse. Stays in one place. You have a rash. You have a stiff neck. You get angry or annoyed more easily than normal. You are more tired or have a harder time waking than normal. You feel weak or dizzy. You feel very thirsty. Get help right away if: You have any symptoms of very bad dehydration. You vomit every time you eat or drink. Your vomiting gets worse, does not go away, or you vomit blood or green stuff. You are getting treatment, but symptoms are getting worse. You have a fever. You have a very bad headache. You have: Diarrhea that gets worse or does not go away. Blood in your poop (stool). This may cause poop to look black and tarry. No pee in 6-8 hours. Only a small amount of pee in 6-8 hours, and the pee is very dark. You have trouble breathing. These symptoms may be an emergency. Get help right away. Call 911. Do not wait to see if the symptoms will go away. Do not drive yourself to the hospital. This information is not intended to replace advice given to you by your health care provider. Make sure you discuss any questions you have with your health care provider. Document Revised: 05/25/2022 Document Reviewed: 05/25/2022 Elsevier Patient Education  2024 [employer]vier Inc.  

## 2023-10-09 DIAGNOSIS — G4733 Obstructive sleep apnea (adult) (pediatric): Secondary | ICD-10-CM | POA: Diagnosis not present

## 2023-10-12 ENCOUNTER — Other Ambulatory Visit: Payer: Self-pay | Admitting: Family

## 2023-10-12 ENCOUNTER — Telehealth: Payer: Self-pay | Admitting: Family Medicine

## 2023-10-12 MED ORDER — AMOXICILLIN-POT CLAVULANATE 875-125 MG PO TABS: 1.0000 | ORAL_TABLET | Freq: Two times a day (BID) | ORAL | 0 refills | Status: DC

## 2023-10-12 NOTE — Telephone Encounter (Signed)
Tami [name] called back and stated that she was already seen for sore throat and nasal congestion. She explained that her sxs still remain and would like for antibiotics sent in. Please call and advise with patient.

## 2023-10-13 ENCOUNTER — Ambulatory Visit: Payer: 59 | Admitting: Physical Therapy

## 2023-10-13 NOTE — Telephone Encounter (Signed)
Called patient's care giver [name]) and made her aware

## 2023-10-14 ENCOUNTER — Ambulatory Visit: Payer: 59 | Admitting: Pulmonary Disease

## 2023-10-14 ENCOUNTER — Ambulatory Visit: Payer: 59 | Admitting: Family Medicine

## 2023-10-15 ENCOUNTER — Inpatient Hospital Stay: Payer: 59 | Attending: Hematology & Oncology | Admitting: Hematology & Oncology

## 2023-10-15 ENCOUNTER — Inpatient Hospital Stay: Payer: 59

## 2023-10-15 ENCOUNTER — Inpatient Hospital Stay: Payer: 59 | Attending: Hematology & Oncology

## 2023-10-15 ENCOUNTER — Encounter: Payer: Self-pay | Admitting: Hematology & Oncology

## 2023-10-15 ENCOUNTER — Other Ambulatory Visit: Payer: Self-pay

## 2023-10-15 VITALS — BP 160/60

## 2023-10-15 VITALS — BP 130/70 | HR 83 | Temp 97.8°F | Resp 17 | Ht 66.0 in | Wt 189.0 lb

## 2023-10-15 DIAGNOSIS — C9002 Multiple myeloma in relapse: Secondary | ICD-10-CM

## 2023-10-15 DIAGNOSIS — Z79899 Other long term (current) drug therapy: Secondary | ICD-10-CM | POA: Insufficient documentation

## 2023-10-15 DIAGNOSIS — Z5112 Encounter for antineoplastic immunotherapy: Secondary | ICD-10-CM | POA: Diagnosis not present

## 2023-10-15 DIAGNOSIS — C9000 Multiple myeloma not having achieved remission: Secondary | ICD-10-CM | POA: Diagnosis not present

## 2023-10-15 DIAGNOSIS — Z5111 Encounter for antineoplastic chemotherapy: Secondary | ICD-10-CM | POA: Diagnosis not present

## 2023-10-15 LAB — CMP (CANCER CENTER ONLY)
ALT: 12 U/L (ref 0–44)
AST: 20 U/L (ref 15–41)
Albumin: 3.7 g/dL (ref 3.5–5.0)
Alkaline Phosphatase: 43 U/L (ref 38–126)
Anion gap: 9 (ref 5–15)
BUN: 16 mg/dL (ref 8–23)
CO2: 25 mmol/L (ref 22–32)
Calcium: 9.2 mg/dL (ref 8.9–10.3)
Chloride: 109 mmol/L (ref 98–111)
Creatinine: 0.63 mg/dL (ref 0.44–1.00)
GFR, Estimated: >60 mL/min (ref >60)
Glucose, Bld: 126 mg/dL — ABNORMAL HIGH (ref 70–99)
Potassium: 3.4 mmol/L — ABNORMAL LOW (ref 3.5–5.1)
Sodium: 143 mmol/L (ref 135–145)
Total Bilirubin: 0.5 mg/dL (ref <1.2)
Total Protein: 7 g/dL (ref 6.5–8.1)

## 2023-10-15 LAB — CBC WITH DIFFERENTIAL (CANCER CENTER ONLY)
Abs Immature Granulocytes: 0.01 10*3/uL (ref 0.00–0.07)
Basophils Absolute: 0 10*3/uL (ref 0.0–0.1)
Basophils Relative: 1 %
Eosinophils Absolute: 0.1 10*3/uL (ref 0.0–0.5)
Eosinophils Relative: 3 %
HCT: 34.8 % — ABNORMAL LOW (ref 36.0–46.0)
Hemoglobin: 11.6 g/dL — ABNORMAL LOW (ref 12.0–15.0)
Immature Granulocytes: 0 %
Lymphocytes Relative: 27 %
Lymphs Abs: 0.9 10*3/uL (ref 0.7–4.0)
MCH: 30.8 pg (ref 26.0–34.0)
MCHC: 33.3 g/dL (ref 30.0–36.0)
MCV: 92.3 fL (ref 80.0–100.0)
Monocytes Absolute: 0.3 10*3/uL (ref 0.1–1.0)
Monocytes Relative: 9 %
Neutro Abs: 1.9 10*3/uL (ref 1.7–7.7)
Neutrophils Relative %: 60 %
Platelet Count: 127 10*3/uL — ABNORMAL LOW (ref 150–400)
RBC: 3.77 MIL/uL — ABNORMAL LOW (ref 3.87–5.11)
RDW: 13.6 % (ref 11.5–15.5)
WBC Count: 3.2 10*3/uL — ABNORMAL LOW (ref 4.0–10.5)
nRBC: 0 % (ref 0.0–0.2)

## 2023-10-15 LAB — LACTATE DEHYDROGENASE: LDH: 211 U/L — ABNORMAL HIGH (ref 98–192)

## 2023-10-15 MED ORDER — SODIUM CHLORIDE 0.9 % IV SOLN
300.0000 mg/m2 | Freq: Once | INTRAVENOUS | Status: AC
  Administered 2023-10-15: 600 mg via INTRAVENOUS
  Filled 2023-10-15: qty 30

## 2023-10-15 MED ORDER — HEPARIN SOD (PORK) LOCK FLUSH 100 UNIT/ML IV SOLN
500.0000 [IU] | Freq: Once | INTRAVENOUS | Status: AC | PRN
  Administered 2023-10-15: 500 [IU]

## 2023-10-15 MED ORDER — SODIUM CHLORIDE 0.9 % IV SOLN
20.0000 mg | Freq: Once | INTRAVENOUS | Status: AC
  Administered 2023-10-15: 20 mg via INTRAVENOUS
  Filled 2023-10-15: qty 2

## 2023-10-15 MED ORDER — DEXTROSE 5 % IV SOLN
56.0000 mg/m2 | Freq: Once | INTRAVENOUS | Status: AC
  Administered 2023-10-15: 110 mg via INTRAVENOUS
  Filled 2023-10-15: qty 30

## 2023-10-15 MED ORDER — SODIUM CHLORIDE 0.9% FLUSH: 3.0000 mL | INTRAVENOUS | Status: DC | PRN

## 2023-10-15 MED ORDER — SODIUM CHLORIDE 0.9 % IV SOLN
10.0000 mg/kg | Freq: Once | INTRAVENOUS | Status: AC
  Administered 2023-10-15: 900 mg via INTRAVENOUS
  Filled 2023-10-15: qty 25

## 2023-10-15 MED ORDER — DIPHENHYDRAMINE HCL 50 MG/ML IJ SOLN
50.0000 mg | Freq: Once | INTRAMUSCULAR | Status: AC
  Administered 2023-10-15: 50 mg via INTRAVENOUS
  Filled 2023-10-15: qty 1

## 2023-10-15 MED ORDER — FAMOTIDINE IN NACL 20-0.9 MG/50ML-% IV SOLN
20.0000 mg | Freq: Once | INTRAVENOUS | Status: AC
  Administered 2023-10-15: 20 mg via INTRAVENOUS
  Filled 2023-10-15: qty 50

## 2023-10-15 MED ORDER — SODIUM CHLORIDE 0.9% FLUSH
10.0000 mL | INTRAVENOUS | Status: DC | PRN
  Administered 2023-10-15: 10 mL

## 2023-10-15 MED ORDER — ACETAMINOPHEN 325 MG PO TABS: 650.0000 mg | ORAL_TABLET | Freq: Once | ORAL | Status: DC

## 2023-10-15 MED ORDER — SODIUM CHLORIDE 0.9 % IV SOLN: Freq: Once | INTRAVENOUS | Status: AC

## 2023-10-15 NOTE — Progress Notes (Signed)
Hematology and Oncology Follow Up Visit  Tami [name] [id number] 1955-12-14 67 y.o. 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  Diarrhea is still a problem for her.  I just do not think the diarrhea is from her treatments.  She does see Gastroenterology for this.  She has had some IV fluids.  Her treatment is working quite nicely.  Her last monoclonal spike was down to 1.0 g/dL.  The IgG level was 1844 mg/dL.  Her Kappa light chain was 0.9 mg/dL.  She is going be up in [place] for Christmas.  We will make an adjustment to her chemotherapy cycle this time.  She has had no problems with cough.  She has had no bleeding.  She has had no dysuria.  There has been no rashes.  She has had no leg swelling.  Overall, I would say that her performance status is probably ECOG 1.   Medications:  Allergies as of 10/15/2023       Reactions   Benazepril Anaphylaxis, Swelling, Hives   angioedema Throat and lip swelling   Ondansetron Hcl Hives   Redness and hives post IV admin on 07/05/17   Codeine Nausea And Vomiting   Morphine Hives   Redness and hives noted post IV admin on 07/05/17        Medication List        Accurate as of October 15, 2023  8:36 AM. If you have any questions, ask your nurse or doctor.          acetaminophen 500 MG tablet Commonly known as: TYLENOL Take 500 mg by mouth every 6 (six) hours as needed.   acyclovir 400 MG tablet Commonly known as: ZOVIRAX Take 1 tablet (400 mg total) by  mouth 2 (two) times daily.   albuterol 108 (90 Base) MCG/ACT inhaler Commonly known as: VENTOLIN HFA Inhale 2 puffs into the lungs every 6 (six) hours as needed for wheezing or shortness of breath.   albuterol (2.5 MG/3ML) 0.083% nebulizer solution Commonly known as: PROVENTIL Take 3 mLs (2.5 mg total) by nebulization every 6 (six) hours as needed for wheezing or shortness of breath.   ALPRAZolam 0.25 MG tablet Commonly known as: XANAX Take 1-2 tablets (0.25-0.5 mg total) by mouth 3 (three) times daily as needed for anxiety.   amLODipine 5 MG tablet Commonly known as: NORVASC Take 5 mg by mouth daily.   amoxicillin-clavulanate 875-125 MG tablet Commonly known as: AUGMENTIN Take 1 tablet by mouth 2 (two) times daily.   aspirin EC 81 MG tablet Take 81 mg by mouth daily. Swallow whole.   budesonide 0.5 MG/2ML nebulizer solution Commonly known as: Pulmicort Take 2 mLs (0.5 mg total) by nebulization 2 (two) times daily as needed (cough, wheezing or shortness of breath).   celecoxib 100 MG capsule Commonly known as: CELEBREX Take 100 mg by mouth 2 (two) times daily.   CENTRUM SILVER WOMEN 50+ PO Take 1 tablet by mouth daily at 6 (six) AM.  cetirizine 10 MG tablet Commonly known as: ZYRTEC Take 10 mg by mouth at bedtime.   cholestyramine light 4 g packet Commonly known as: PREVALITE Take 4 g by mouth 2 (two) times daily.   diclofenac Sodium 1 % Gel Commonly known as: VOLTAREN Apply 2 g topically as directed.   dicyclomine 10 MG capsule Commonly known as: BENTYL Take 1 capsule (10 mg total) by mouth 4 (four) times daily -  before meals and at bedtime. cramping   diphenoxylate-atropine 2.5-0.025 MG tablet Commonly known as: Lomotil Take 1 tablet by mouth 2 (two) times daily.   famotidine 20 MG tablet Commonly known as: PEPCID Take 1 tablet (20 mg total) by mouth at bedtime.   FLORASTOR PO Take 500 mg by mouth daily at 6 (six) AM.   fluticasone 50 MCG/ACT nasal  spray Commonly known as: FLONASE Place 2 sprays into both nostrils daily.   furosemide 20 MG tablet Commonly known as: LASIX Take 20 mg by mouth daily as needed (fluid retention (feet swelling)).   HYDROcodone bit-homatropine 5-1.5 MG/5ML syrup Commonly known as: Hydromet Take 5 mLs by mouth every 8 (eight) hours as needed for cough.   hydrOXYzine 10 MG tablet Commonly known as: ATARAX TAKE ONE (1) TABLET BY MOUTH 3 TIMES DAILY AS NEEDED FOR ITCHING   hyoscyamine 0.125 MG SL tablet Commonly known as: LEVSIN SL Place 1 tablet (0.125 mg total) under the tongue every 4 (four) hours as needed.   lidocaine-prilocaine cream Commonly known as: EMLA Apply 1 Application topically as needed.   loperamide 2 MG capsule Commonly known as: IMODIUM Take 4 mg by mouth in the morning and at bedtime.   losartan 50 MG tablet Commonly known as: COZAAR Take 50 mg by mouth in the morning and at bedtime.   Magnesium Glycinate 100 MG Caps Take 200 mg by mouth at bedtime.   meclizine 12.5 MG tablet Commonly known as: ANTIVERT Take 12.5 mg by mouth 2 (two) times daily as needed for dizziness or nausea.   metoprolol succinate 50 MG 24 hr tablet Commonly known as: TOPROL-XL Take 1 tablet (50 mg total) by mouth 2 (two) times daily. What changed: when to take this   montelukast 10 MG tablet Commonly known as: Singulair Take 1 tablet (10 mg total) by mouth at bedtime. Please start taking this on September 4.   neomycin-polymyxin-hydrocortisone OTIC solution Commonly known as: CORTISPORIN Place 4 drops into both ears 4 (four) times daily.   nortriptyline 10 MG capsule Commonly known as: Pamelor Take 1 capsule (10 mg total) by mouth at bedtime.   ondansetron 8 MG tablet Commonly known as: Zofran Take 1 tablet (8 mg total) by mouth every 8 (eight) hours as needed for nausea or vomiting.   oxyCODONE-acetaminophen 5-325 MG tablet Commonly known as: PERCOCET/ROXICET Take 1 tablet by mouth  every 6 (six) hours as needed for severe pain.   oxyCODONE-acetaminophen 5-325 MG tablet Commonly known as: Percocet Take 2 tablets by mouth every 6 (six) hours as needed.   pantoprazole 40 MG tablet Commonly known as: PROTONIX Take 1 tablet (40 mg total) by mouth daily.   Pillow Mask/Adult Misc . Provide PAP supplies (expires 12 months from 09/21/2023) .   potassium chloride SA 20 MEQ tablet Commonly known as: KLOR-CON M Take 1 tablet (20 mEq total) by mouth daily.   prochlorperazine 10 MG tablet Commonly known as: COMPAZINE Take 0.5 tablets (5 mg total) by mouth 2 (two) times daily as needed for nausea or vomiting.  scopolamine 1 MG/3DAYS Commonly known as: TRANSDERM-SCOP Place 1 patch (1.5 mg total) onto the skin every 3 (three) days.   sertraline 50 MG tablet Commonly known as: ZOLOFT Take 1 tablet (50 mg total) by mouth daily.   spironolactone 25 MG tablet Commonly known as: ALDACTONE Take 25 mg by mouth daily.   Vitamin D3 50 MCG (2000 UT) Tabs Take 2,000 Units by mouth in the morning.        Allergies:  Allergies  Allergen Reactions   Benazepril Anaphylaxis, Swelling and Hives    angioedema Throat and lip swelling   Ondansetron Hcl Hives    Redness and hives post IV admin on 07/05/17   Codeine Nausea And Vomiting   Morphine Hives    Redness and hives noted post IV admin on 07/05/17    Past Medical History, Surgical history, Social history, and Family History were reviewed and updated.  Review of Systems: Review of Systems  Constitutional: Negative.   HENT: Negative.    Eyes: Negative.   Respiratory: Negative.    Cardiovascular: Negative.   Gastrointestinal: Negative.   Genitourinary: Negative.   Musculoskeletal: Negative.   Skin: Negative.   Neurological: Negative.   Endo/Heme/Allergies: Negative.   Psychiatric/Behavioral: Negative.        Physical Exam: Temperature is 97.8.  Pulse 83.  Blood pressure 130/70.  Weight is 189 pounds.      Wt Readings from Last 3 Encounters:  10/15/23 189 lb (85.7 kg)  09/24/23 191 lb (86.6 kg)  09/22/23 189 lb (85.7 kg)    Physical Exam Vitals reviewed.  HENT:     Head: Normocephalic and atraumatic.  Eyes:     Pupils: Pupils are equal, round, and reactive to light.     Comments: Mild pallor of conjunctiva  Cardiovascular:     Rate and Rhythm: Normal rate and regular rhythm.     Heart sounds: Normal heart sounds.  Pulmonary:     Effort: Pulmonary effort is normal.     Breath sounds: Normal breath sounds.  Abdominal:     General: Bowel sounds are normal.     Palpations: Abdomen is soft.  Musculoskeletal:        General: No tenderness or deformity. Normal range of motion.     Cervical back: Normal range of motion.  Lymphadenopathy:     Cervical: No cervical adenopathy.  Skin:    General: Skin is warm and dry.     Findings: No erythema or rash.  Neurological:     Mental Status: She is alert and oriented to person, place, and time.  Psychiatric:        Behavior: Behavior normal.        Thought Content: Thought content normal.        Judgment: Judgment normal.     Lab Results  Component Value Date   WBC 3.2 (L) 10/15/2023   HGB 11.6 (L) 10/15/2023   HCT 34.8 (L) 10/15/2023   MCV 92.3 10/15/2023   PLT 127 (L) 10/15/2023   Lab Results  Component Value Date   FERRITIN 364.6 (H) 09/16/2023   IRON 51 08/13/2023   TIBC 329 08/13/2023   UIBC 278 08/13/2023   IRONPCTSAT 16 08/13/2023   Lab Results  Component Value Date   RETICCTPCT 1.2 11/27/2022   RBC 3.77 (L) 10/15/2023   RETICCTABS 32,640 07/16/2016   Lab Results  Component Value Date   KPAFRELGTCHN 9.2 09/10/2023   LAMBDASER <1.5 (L) 09/10/2023   KAPLAMBRATIO >6.13 (H) 09/10/2023  Lab Results  Component Value Date   IGGSERUM 1,846 (H) 09/10/2023   IGGSERUM 1,841 (H) 09/10/2023   IGA 33 (L) 09/10/2023   IGA 35 (L) 09/10/2023   IGMSERUM 40 09/10/2023   IGMSERUM 45 09/10/2023   Lab Results   Component Value Date   TOTALPROTELP 6.9 09/10/2023   ALBUMINELP 3.3 09/10/2023   A1GS 0.3 09/10/2023   A2GS 0.9 09/10/2023   BETS 1.0 09/10/2023   BETA2SER 0.5 07/16/2016   GAMS 1.4 09/10/2023   MSPIKE 1.0 (H) 09/10/2023   SPEI Comment 10/27/2022     Chemistry      Component Value Date/Time   NA 142 10/05/2023 0804   NA 143 11/08/2017 1127   NA 140 12/31/2016 1000   K 3.5 10/05/2023 0804   K 4.0 11/08/2017 1127   K 3.6 12/31/2016 1000   CL 107 10/05/2023 0804   CL 107 11/08/2017 1127   CO2 26 10/05/2023 0804   CO2 25 11/08/2017 1127   CO2 24 12/31/2016 1000   BUN 17 10/05/2023 0804   BUN 19 11/08/2017 1127   BUN 22.3 12/31/2016 1000   CREATININE 0.59 10/05/2023 0804   CREATININE 0.9 11/08/2017 1127   CREATININE 0.8 12/31/2016 1000      Component Value Date/Time   CALCIUM 9.2 10/05/2023 0804   CALCIUM 9.9 11/08/2017 1127   CALCIUM 9.5 12/31/2016 1000   ALKPHOS 49 10/05/2023 0804   ALKPHOS 44 11/08/2017 1127   ALKPHOS 51 12/31/2016 1000   AST 19 10/05/2023 0804   AST 28 12/31/2016 1000   ALT 12 10/05/2023 0804   ALT 36 11/08/2017 1127   ALT 16 12/31/2016 1000   BILITOT 0.4 10/05/2023 0804   BILITOT 0.46 12/31/2016 1000       Impression and Plan: Tami [name] is a very pleasant 67 yo African American female with a previous IgG kappa MGUS with progression to myeloma.  Again, she is doing quite nicely.  Again I just think the diarrhea is something more related to her GI system.  We will go ahead with her fourth cycle of treatment.  Again we will drop the #15 so that she will be able to go up to [place] and enjoy Christmas with her family.  I think this is very important for her.  We will plan to get her back to see [place] after New Year's.  I told her to get back on some potassium.   Josph Macho, MD 12/6/20248:36 AM

## 2023-10-15 NOTE — Patient Instructions (Signed)
[facility] - A DEPT OF [facility]  Discharge Instructions: Thank you for choosing [facility] to provide your oncology and hematology care.   If you have a lab appointment with the Cancer Center, please go directly to the Cancer Center and check in at the registration area.  Wear comfortable clothing and clothing appropriate for easy access to any Portacath or PICC line.   We strive to give you quality time with your provider. You may need to reschedule your appointment if you arrive late (15 or more minutes).  Arriving late affects you and other patients whose appointments are after yours.  Also, if you miss three or more appointments without notifying the office, you may be dismissed from the clinic at the provider's discretion.      For prescription refill requests, have your pharmacy contact our office and allow 72 hours for refills to be completed.    Today you received the following chemotherapy and/or immunotherapy agents cytoxan/carfilzomib/isatuximab      To help prevent nausea and vomiting after your treatment, we encourage you to take your nausea medication as directed.  BELOW ARE SYMPTOMS THAT SHOULD BE REPORTED IMMEDIATELY: *FEVER GREATER THAN 100.4 F (38 C) OR HIGHER *CHILLS OR SWEATING *NAUSEA AND VOMITING THAT IS NOT CONTROLLED WITH YOUR NAUSEA MEDICATION *UNUSUAL SHORTNESS OF BREATH *UNUSUAL BRUISING OR BLEEDING *URINARY PROBLEMS (pain or burning when urinating, or frequent urination) *BOWEL PROBLEMS (unusual diarrhea, constipation, pain near the anus) TENDERNESS IN MOUTH AND THROAT WITH OR WITHOUT PRESENCE OF ULCERS (sore throat, sores in mouth, or a toothache) UNUSUAL RASH, SWELLING OR PAIN  UNUSUAL VAGINAL DISCHARGE OR ITCHING   Items with * indicate a potential emergency and should be followed up as soon as possible or go to the Emergency Department if any problems should occur.  Please show the CHEMOTHERAPY ALERT  CARD or IMMUNOTHERAPY ALERT CARD at check-in to the Emergency Department and triage nurse. Should you have questions after your visit or need to cancel or reschedule your appointment, please contact [facility] - A DEPT OF [facility]  [phone] and follow the prompts.  Office hours are 8:00 a.m. to 4:30 p.m. Monday - Friday. Please note that voicemails left after 4:00 p.m. may not be returned until the following business day.  We are closed weekends and major holidays. You have access to a nurse at all times for urgent questions. Please call the main number to the clinic [phone] and follow the prompts.  For any non-urgent questions, you may also contact your provider using MyChart. We now offer e-Visits for anyone 48 and older to request care online for non-urgent symptoms. For details visit [email or web address].   Also download the MyChart app! Go to the app store, search "MyChart", open the app, select [place], and log in with your MyChart username and password.

## 2023-10-17 LAB — IGG, IGA, IGM
IgA: 37 mg/dL — ABNORMAL LOW (ref 87–352)
IgG (Immunoglobin G), Serum: 1279 mg/dL (ref 586–1602)
IgM (Immunoglobulin M), Srm: 30 mg/dL (ref 26–217)

## 2023-10-18 ENCOUNTER — Emergency Department (HOSPITAL_BASED_OUTPATIENT_CLINIC_OR_DEPARTMENT_OTHER): Payer: 59

## 2023-10-18 ENCOUNTER — Other Ambulatory Visit: Payer: Self-pay

## 2023-10-18 ENCOUNTER — Encounter (HOSPITAL_BASED_OUTPATIENT_CLINIC_OR_DEPARTMENT_OTHER): Payer: Self-pay

## 2023-10-18 ENCOUNTER — Emergency Department (HOSPITAL_BASED_OUTPATIENT_CLINIC_OR_DEPARTMENT_OTHER)
Admission: EM | Admit: 2023-10-18 | Discharge: 2023-10-18 | Disposition: A | Discharge status: Home / Self Care | Payer: 59 | Attending: Emergency Medicine | Admitting: Emergency Medicine

## 2023-10-18 DIAGNOSIS — N281 Cyst of kidney, acquired: Secondary | ICD-10-CM | POA: Diagnosis not present

## 2023-10-18 DIAGNOSIS — R197 Diarrhea, unspecified: Secondary | ICD-10-CM | POA: Insufficient documentation

## 2023-10-18 DIAGNOSIS — Z79899 Other long term (current) drug therapy: Secondary | ICD-10-CM | POA: Diagnosis not present

## 2023-10-18 DIAGNOSIS — K5730 Diverticulosis of large intestine without perforation or abscess without bleeding: Secondary | ICD-10-CM | POA: Diagnosis not present

## 2023-10-18 DIAGNOSIS — Z8579 Personal history of other malignant neoplasms of lymphoid, hematopoietic and related tissues: Secondary | ICD-10-CM | POA: Diagnosis not present

## 2023-10-18 DIAGNOSIS — I509 Heart failure, unspecified: Secondary | ICD-10-CM | POA: Diagnosis not present

## 2023-10-18 DIAGNOSIS — I110 Hypertensive heart disease with heart failure: Secondary | ICD-10-CM | POA: Diagnosis not present

## 2023-10-18 DIAGNOSIS — Z7982 Long term (current) use of aspirin: Secondary | ICD-10-CM | POA: Insufficient documentation

## 2023-10-18 DIAGNOSIS — R109 Unspecified abdominal pain: Secondary | ICD-10-CM | POA: Diagnosis not present

## 2023-10-18 LAB — CBC
HCT: 40.1 % (ref 36.0–46.0)
Hemoglobin: 13.2 g/dL (ref 12.0–15.0)
MCH: 30.1 pg (ref 26.0–34.0)
MCHC: 32.9 g/dL (ref 30.0–36.0)
MCV: 91.6 fL (ref 80.0–100.0)
Platelets: 65 10*3/uL — ABNORMAL LOW (ref 150–400)
RBC: 4.38 MIL/uL (ref 3.87–5.11)
RDW: 13.5 % (ref 11.5–15.5)
WBC: 3.4 10*3/uL — ABNORMAL LOW (ref 4.0–10.5)
nRBC: 0.9 % — ABNORMAL HIGH (ref 0.0–0.2)

## 2023-10-18 LAB — KAPPA/LAMBDA LIGHT CHAINS
Kappa free light chain: 9.3 mg/L (ref 3.3–19.4)
Kappa, lambda light chain ratio: 4.04 — ABNORMAL HIGH (ref 0.26–1.65)
Lambda free light chains: 2.3 mg/L — ABNORMAL LOW (ref 5.7–26.3)

## 2023-10-18 LAB — COMPREHENSIVE METABOLIC PANEL
ALT: 17 U/L (ref 0–44)
AST: 27 U/L (ref 15–41)
Albumin: 3.7 g/dL (ref 3.5–5.0)
Alkaline Phosphatase: 48 U/L (ref 38–126)
Anion gap: 9 (ref 5–15)
BUN: 16 mg/dL (ref 8–23)
CO2: 22 mmol/L (ref 22–32)
Calcium: 9.1 mg/dL (ref 8.9–10.3)
Chloride: 108 mmol/L (ref 98–111)
Creatinine, Ser: 0.57 mg/dL (ref 0.44–1.00)
GFR, Estimated: >60 mL/min (ref >60)
Glucose, Bld: 111 mg/dL — ABNORMAL HIGH (ref 70–99)
Potassium: 3.6 mmol/L (ref 3.5–5.1)
Sodium: 139 mmol/L (ref 135–145)
Total Bilirubin: 0.6 mg/dL (ref <1.2)
Total Protein: 7.4 g/dL (ref 6.5–8.1)

## 2023-10-18 LAB — URINALYSIS, ROUTINE W REFLEX MICROSCOPIC
Bilirubin Urine: NEGATIVE
Glucose, UA: NEGATIVE mg/dL
Ketones, ur: NEGATIVE mg/dL
Leukocytes,Ua: NEGATIVE
Nitrite: NEGATIVE
Protein, ur: 30 mg/dL — AB
Specific Gravity, Urine: 1.025 (ref 1.005–1.030)
pH: 5.5 (ref 5.0–8.0)

## 2023-10-18 LAB — URINALYSIS, MICROSCOPIC (REFLEX)

## 2023-10-18 LAB — LIPASE, BLOOD: Lipase: 29 U/L (ref 11–51)

## 2023-10-18 MED ORDER — IOHEXOL 300 MG/ML  SOLN
100.0000 mL | Freq: Once | INTRAMUSCULAR | Status: AC | PRN
  Administered 2023-10-18: 100 mL via INTRAVENOUS

## 2023-10-18 MED ORDER — LACTATED RINGERS IV BOLUS
500.0000 mL | Freq: Once | INTRAVENOUS | Status: AC
  Administered 2023-10-18: 500 mL via INTRAVENOUS

## 2023-10-18 NOTE — ED Notes (Signed)
Pt given ginger ale and lance crackers

## 2023-10-18 NOTE — ED Provider Notes (Signed)
[place] [facility] Provider Note   CSN: [id number] Arrival date & time: 10/18/23  1540     History {Add pertinent medical, surgical, social history, OB history to HPI:1} Chief Complaint  Patient presents with   Abdominal Pain    Tami [name] is a 67 y.o. female.   Abdominal Pain 67 year old female history of multi myeloma, CHF, GERD, hypertension, hyperlipidemia presenting for diarrhea and abdominal pain.  Symptoms been ongoing for 2 days since Friday.  She has had episodes like this before that have resolved.  She has generalized abdominal pain periumbilical.  3-4 episodes of nonbloody diarrhea a day.  No melena.  Some nausea no vomiting.  No chest pain or shortness of breath.  Denies any questionable food intake or recent travel.  No sick contacts.     Home Medications Prior to Admission medications   Medication Sig Start Date End Date Taking? Authorizing Provider  acetaminophen (TYLENOL) 500 MG tablet Take 500 mg by mouth every 6 (six) hours as needed.    [provider]  acyclovir (ZOVIRAX) 400 MG tablet Take 1 tablet (400 mg total) by mouth 2 (two) times daily. 04/22/23   Josph Macho, MD  albuterol (PROVENTIL) (2.5 MG/3ML) 0.083% nebulizer solution Take 3 mLs (2.5 mg total) by nebulization every 6 (six) hours as needed for wheezing or shortness of breath. 12/15/22   [name], MD  albuterol (VENTOLIN HFA) 108 (90 Base) MCG/ACT inhaler Inhale 2 puffs into the lungs every 6 (six) hours as needed for wheezing or shortness of breath. 12/07/22   [name], MD  ALPRAZolam Prudy Feeler) 0.25 MG tablet Take 1-2 tablets (0.25-0.5 mg total) by mouth 3 (three) times daily as needed for anxiety. 06/14/23   [name], MD  amLODipine (NORVASC) 5 MG tablet Take 5 mg by mouth daily. 09/14/23   [provider]  amoxicillin-clavulanate (AUGMENTIN) 875-125 MG tablet Take 1 tablet by mouth 2 (two) times daily. 10/12/23   Worthy Rancher B, FNP  aspirin EC 81 MG tablet Take 81 mg by mouth daily. Swallow whole.    [provider]  budesonide (PULMICORT) 0.5 MG/2ML nebulizer solution Take 2 mLs (0.5 mg total) by nebulization 2 (two) times daily as needed (cough, wheezing or shortness of breath). 12/31/22   Martina Sinner, MD  celecoxib (CELEBREX) 100 MG capsule Take 100 mg by mouth 2 (two) times daily. 06/07/23   [provider]  cetirizine (ZYRTEC) 10 MG tablet Take 10 mg by mouth at bedtime.    [provider]  Cholecalciferol (VITAMIN D3) 50 MCG (2000 UT) TABS Take 2,000 Units by mouth in the morning.    [provider]  cholestyramine light (PREVALITE) 4 g packet Take 4 g by mouth 2 (two) times daily.    [provider]  diclofenac Sodium (VOLTAREN) 1 % GEL Apply 2 g topically as directed. 08/19/23 11/17/23  [provider]  dicyclomine (BENTYL) 10 MG capsule Take 1 capsule (10 mg total) by mouth 4 (four) times daily -  before meals and at bedtime. cramping 09/09/23 12/08/23  Judyann Munson, MD  diphenoxylate-atropine (LOMOTIL) 2.5-0.025 MG tablet Take 1 tablet by mouth 2 (two) times daily. 05/19/23   Lynann Bologna, MD  famotidine (PEPCID) 20 MG tablet Take 1 tablet (20 mg total) by mouth at bedtime. 09/16/23   [name], MD  fluticasone (FLONASE) 50 MCG/ACT nasal spray Place 2 sprays into both nostrils daily. 01/07/20  Joy, Shawn C, PA-C  furosemide (LASIX) 20 MG tablet Take 20 mg by mouth daily as needed (fluid retention (feet swelling)). 07/03/20   [provider]  HYDROcodone bit-homatropine (HYDROMET) 5-1.5 MG/5ML syrup Take 5 mLs by mouth every 8 (eight) hours as needed for cough. 09/20/23   [name], MD  hydrOXYzine (ATARAX) 10 MG tablet TAKE ONE (1) TABLET BY MOUTH 3 TIMES DAILY AS NEEDED FOR ITCHING 03/03/23   [name], MD  hyoscyamine (LEVSIN SL) 0.125 MG SL tablet Place 1 tablet (0.125 mg total) under the tongue every 4 (four) hours as  needed. 04/27/23   [name], MD  lidocaine-prilocaine (EMLA) cream Apply 1 Application topically as needed. 07/01/23   Josph Macho, MD  loperamide (IMODIUM) 2 MG capsule Take 4 mg by mouth in the morning and at bedtime.    [provider]  losartan (COZAAR) 50 MG tablet Take 50 mg by mouth in the morning and at bedtime. 04/14/21   [provider]  Magnesium Glycinate 100 MG CAPS Take 200 mg by mouth at bedtime. 09/21/22   [provider]  meclizine (ANTIVERT) 12.5 MG tablet Take 12.5 mg by mouth 2 (two) times daily as needed for dizziness or nausea. 01/28/22   [provider]  metoprolol succinate (TOPROL-XL) 50 MG 24 hr tablet Take 1 tablet (50 mg total) by mouth 2 (two) times daily. Patient taking differently: Take 50 mg by mouth daily. 02/23/23   [name], MD  montelukast (SINGULAIR) 10 MG tablet Take 1 tablet (10 mg total) by mouth at bedtime. Please start taking this on September 4. 07/01/23   Josph Macho, MD  Multiple Vitamins-Minerals (CENTRUM SILVER WOMEN 50+ PO) Take 1 tablet by mouth daily at 6 (six) AM.    [provider]  neomycin-polymyxin-hydrocortisone (CORTISPORIN) OTIC solution Place 4 drops into both ears 4 (four) times daily. 07/30/23   Josph Macho, MD  nortriptyline (PAMELOR) 10 MG capsule Take 1 capsule (10 mg total) by mouth at bedtime. 06/14/23   [name], MD  ondansetron (ZOFRAN) 8 MG tablet Take 1 tablet (8 mg total) by mouth every 8 (eight) hours as needed for nausea or vomiting. 07/14/23   Josph Macho, MD  oxyCODONE-acetaminophen (PERCOCET) 5-325 MG tablet Take 2 tablets by mouth every 6 (six) hours as needed. 08/07/23   Charlynne Pander, MD  oxyCODONE-acetaminophen (PERCOCET/ROXICET) 5-325 MG tablet Take 1 tablet by mouth every 6 (six) hours as needed for severe pain. 11/07/21   [provider]  pantoprazole (PROTONIX) 40 MG tablet Take 1 tablet (40 mg total) by mouth daily. 09/03/23    Sandford Craze, NP  potassium chloride SA (KLOR-CON M) 20 MEQ tablet Take 1 tablet (20 mEq total) by mouth daily. 01/15/23   [name], MD  prochlorperazine (COMPAZINE) 10 MG tablet Take 0.5 tablets (5 mg total) by mouth 2 (two) times daily as needed for nausea or vomiting. 03/16/23   [name], MD  Respiratory Therapy Supplies (PILLOW MASK/ADULT) MISC . Provide PAP supplies (expires 12 months from 09/21/2023) . 09/29/23   [provider]  Saccharomyces boulardii (FLORASTOR PO) Take 500 mg by mouth daily at 6 (six) AM.    [provider]  scopolamine (TRANSDERM-SCOP) 1 MG/3DAYS Place 1 patch (1.5 mg total) onto the skin every 3 (three) days. 07/01/23   Josph Macho, MD  sertraline (ZOLOFT) 50 MG tablet Take 1 tablet (50 mg total) by mouth daily.  06/14/23   [name], MD  spironolactone (ALDACTONE) 25 MG tablet Take 25 mg by mouth daily.    [provider]      Allergies    Benazepril, Ondansetron hcl, Codeine, and Morphine    Review of Systems   Review of Systems  Gastrointestinal:  Positive for abdominal pain.  Review of systems completed and notable as per HPI.  ROS otherwise negative.   Physical Exam Updated Vital Signs BP (!) 153/92 (BP Location: Left Arm)   Pulse 100   Temp 98.3 F (36.8 C)   Resp 18   Ht 5\' 6"  (1.676 m)   Wt 85.7 kg   SpO2 99%   BMI 30.51 kg/m  Physical Exam Vitals and nursing note reviewed.  Constitutional:      General: She is not in acute distress.    Appearance: She is well-developed.  HENT:     Head: Normocephalic and atraumatic.  Eyes:     Conjunctiva/sclera: Conjunctivae normal.  Cardiovascular:     Rate and Rhythm: Normal rate and regular rhythm.     Pulses: Normal pulses.     Heart sounds: Normal heart sounds. No murmur heard. Pulmonary:     Effort: Pulmonary effort is normal. No respiratory distress.     Breath sounds: Normal breath sounds.  Abdominal:     Palpations: Abdomen is soft.      Tenderness: There is generalized abdominal tenderness. There is no guarding or rebound.  Musculoskeletal:        General: No swelling.     Cervical back: Neck supple.  Skin:    General: Skin is warm and dry.     Capillary Refill: Capillary refill takes less than 2 seconds.  Neurological:     Mental Status: She is alert.  Psychiatric:        Mood and Affect: Mood normal.     ED Results / Procedures / Treatments   Labs (all labs ordered are listed, but only abnormal results are displayed) Labs Reviewed  COMPREHENSIVE METABOLIC PANEL - Abnormal; Notable for the following components:      Result Value   Glucose, Bld 111 (*)    All other components within normal limits  URINALYSIS, ROUTINE W REFLEX MICROSCOPIC - Abnormal; Notable for the following components:   Hgb urine dipstick TRACE (*)    Protein, ur 30 (*)    All other components within normal limits  URINALYSIS, MICROSCOPIC (REFLEX) - Abnormal; Notable for the following components:   Bacteria, UA RARE (*)    All other components within normal limits  LIPASE, BLOOD  CBC    EKG None  Radiology No results found.  Procedures Procedures  {Document cardiac monitor, telemetry assessment procedure when appropriate:1}  Medications Ordered in ED Medications - No data to display  ED Course/ Medical Decision Making/ A&P   {   Click here for ABCD2, HEART and other calculatorsREFRESH Note before signing :1}                              Medical Decision Making Amount and/or Complexity of Data Reviewed Labs: ordered.   Medical Decision Making:   Tami [name] is a 67 y.o. female who presented to the ED today with abdominal pain, diarrhea.  Vitals reviewed notable for borderline tachycardia.  Exam she is overall well-appearing.  She is mild generalized abdominal tenderness.  She has had 3 episodes of diarrhea, nonbloody.  And  concern for possible diverticulitis obtain CT scan.  No recent antibiotics, lower suspicion for C.  difficile patient with only 3-4 episodes a day and no leukocytosis although does have history of pancytopenia.  She is thrombocytopenic here slightly worse than normal.  No evidence of bleeding at this time.   {crccomplexity:27900} Reviewed and confirmed nursing documentation for past medical history, family history, social history.  Reassessment and Plan:   ***    Patient's presentation is most consistent with {EM COPA:27473}     {Document critical care time when appropriate:1} {Document review of labs and clinical decision tools ie heart score, Chads2Vasc2 etc:1}  {Document your independent review of radiology images, and any outside records:1} {Document your discussion with family members, caretakers, and with consultants:1} {Document social determinants of health affecting pt's care:1} {Document your decision making why or why not admission, treatments were needed:1} Final Clinical Impression(s) / ED Diagnoses Final diagnoses:  None    Rx / DC Orders ED Discharge Orders     None

## 2023-10-18 NOTE — ED Notes (Signed)
Discharge paperwork reviewed entirely with patient, including follow up care. Pain was under control. No prescriptions were called in, but all questions were addressed.  Pt verbalized understanding as well as all parties involved. No questions or concerns voiced at the time of discharge. No acute distress noted.   Pt ambulated out to PVA without incident or assistance.  Pt advised they will seek followup care with a specialist and followup with their PCP.

## 2023-10-18 NOTE — ED Triage Notes (Signed)
Pt arrives with c/o ABD pain that started 3 days ago. Pt reports she is having diarrhea 3-4 times a day and nausea. Pt denies fevers.

## 2023-10-18 NOTE — Discharge Instructions (Signed)
Your CT scan was overall reassuring.  Your lab work showed low platelet count, you need to call your oncologist tomorrow to schedule follow-up and repeat blood work.  You should follow-up with your doctor later this week and continue follow-up with gastroenterology.  If you develop worsening diarrhea, fever, abdominal pain or any other new concerning symptoms including fever you should return to the ED.

## 2023-10-19 ENCOUNTER — Telehealth: Payer: Self-pay

## 2023-10-19 NOTE — Transitions of Care (Post Inpatient/ED Visit) (Unsigned)
   10/19/2023  Name: Tami [name] MRN: [id number] DOB: June 09, 1956  Today's TOC FU Call Status: Today's TOC FU Call Status:: Unsuccessful Call (1st Attempt) Unsuccessful Call (1st Attempt) Date: 10/19/23  Attempted to reach the patient regarding the most recent Inpatient/ED visit.  Follow Up Plan: Additional outreach attempts will be made to reach the patient to complete the Transitions of Care (Post Inpatient/ED visit) call.    Shannelle Alguire, CMA  [facility] Team Direct Dial: [phone]

## 2023-10-20 NOTE — Transitions of Care (Post Inpatient/ED Visit) (Signed)
 10/20/2023  Name: Tami [name] MRN: [id number] DOB: Sep 29, 1956  Today's TOC FU Call Status: Today's TOC FU Call Status:: Successful TOC FU Call Completed Unsuccessful Call (1st Attempt) Date: 10/19/23 Patient's Name and Date of Birth confirmed.  Transition Care Management Follow-up Telephone Call Date of Discharge: 10/18/23 Discharge Facility: [facility] Type of Discharge: Emergency Department Reason for ED Visit: Other: (diarrhea unspecified) How have you been since you were released from the hospital?: Same (pt states she is really tired and the diarrhea has started again. she is taking all of the meds as prescribed) Any questions or concerns?: No  Items Reviewed: Did you receive and understand the discharge instructions provided?: Yes Medications obtained,verified, and reconciled?: Yes (Medications Reviewed) Any new allergies since your discharge?: No Dietary orders reviewed?: Yes Type of Diet Ordered:: as before  Medications Reviewed Today: Medications Reviewed Today     Reviewed by Dutch Ing, Jordan Hawks, CMA (Certified Medical Assistant) on 10/20/23 at 1509  Med List Status: <None>   Medication Order Taking? Sig Documenting Provider Last Dose Status Informant  acetaminophen (TYLENOL) 500 MG tablet [id number] No Take 500 mg by mouth every 6 (six) hours as needed. [provider] Taking Active   acyclovir (ZOVIRAX) 400 MG tablet [id number] No Take 1 tablet (400 mg total) by mouth 2 (two) times daily. Josph Macho, MD Taking Active   albuterol (PROVENTIL) (2.5 MG/3ML) 0.083% nebulizer solution [id number] No Take 3 mLs (2.5 mg total) by nebulization every 6 (six) hours as needed for wheezing or shortness of breath. [name], MD Taking Active Self  albuterol (VENTOLIN HFA) 108 (90 Base) MCG/ACT inhaler [id number] No Inhale 2 puffs into the lungs every 6 (six) hours as needed for wheezing or shortness of breath. [name], MD Taking Active Self   ALPRAZolam Prudy Feeler) 0.25 MG tablet [id number] No Take 1-2 tablets (0.25-0.5 mg total) by mouth 3 (three) times daily as needed for anxiety. [name], MD Taking Active   amLODipine (NORVASC) 5 MG tablet [id number] No Take 5 mg by mouth daily. [provider] Taking Active   amoxicillin-clavulanate (AUGMENTIN) 875-125 MG tablet [id number]  Take 1 tablet by mouth 2 (two) times daily. Worthy Rancher B, FNP  Active   aspirin EC 81 MG tablet [id number] No Take 81 mg by mouth daily. Swallow whole. [provider] Taking Active Self  budesonide (PULMICORT) 0.5 MG/2ML nebulizer solution [id number] No Take 2 mLs (0.5 mg total) by nebulization 2 (two) times daily as needed (cough, wheezing or shortness of breath). Martina Sinner, MD Taking Active Self  celecoxib (CELEBREX) 100 MG capsule [id number] No Take 100 mg by mouth 2 (two) times daily. [provider] Taking Active            Med Note Earlene Plater, Irena Reichmann Jul 08, 2023  9:32 AM) Reports takes as needed  cetirizine (ZYRTEC) 10 MG tablet [id number] No Take 10 mg by mouth at bedtime. [provider] Taking Active Self  Cholecalciferol (VITAMIN D3) 50 MCG (2000 UT) TABS [id number] No Take 2,000 Units by mouth in the morning. [provider] Taking Active Self  cholestyramine light (PREVALITE) 4 g packet [id number] No Take 4 g by mouth 2 (two) times daily. [provider] Taking Active Self  diclofenac Sodium (VOLTAREN) 1 % GEL [id number] No Apply 2 g topically as directed. [provider] Taking Active   dicyclomine (BENTYL) 10 MG capsule [id number] No Take 1 capsule (10 mg total) by  mouth 4 (four) times daily -  before meals and at bedtime. cramping Judyann Munson, MD Taking Active   diphenoxylate-atropine (LOMOTIL) 2.5-0.025 MG tablet [id number] No Take 1 tablet by mouth 2 (two) times daily. Lynann Bologna, MD Taking Active   famotidine (PEPCID) 20 MG tablet [id number] No Take 1 tablet (20 mg  total) by mouth at bedtime. [name], MD Taking Active   fluticasone [facility]) 50 MCG/ACT nasal spray [id number] No Place 2 sprays into both nostrils daily. Anselm Pancoast, PA-C Taking Active Self  furosemide (LASIX) 20 MG tablet [id number] No Take 20 mg by mouth daily as needed (fluid retention (feet swelling)). [provider] Taking Active Self  HYDROcodone bit-homatropine (HYDROMET) 5-1.5 MG/5ML syrup [id number] No Take 5 mLs by mouth every 8 (eight) hours as needed for cough. [name], MD Taking Active   hydrOXYzine (ATARAX) 10 MG tablet [id number] No TAKE ONE (1) TABLET BY MOUTH 3 TIMES DAILY AS NEEDED FOR Elwin Sleight, MD Taking Active Self  hyoscyamine (LEVSIN SL) 0.125 MG SL tablet [id number] No Place 1 tablet (0.125 mg total) under the tongue every 4 (four) hours as needed. [name], MD Taking Active   lidocaine-prilocaine (EMLA) cream [id number] No Apply 1 Application topically as needed. Josph Macho, MD Taking Active   loperamide (IMODIUM) 2 MG capsule [id number] No Take 4 mg by mouth in the morning and at bedtime. [provider] Taking Active Self  losartan (COZAAR) 50 MG tablet [id number] No Take 50 mg by mouth in the morning and at bedtime. [provider] Taking Active Self  Magnesium Glycinate 100 MG CAPS [id number] No Take 200 mg by mouth at bedtime. [provider] Taking Active Self  meclizine (ANTIVERT) 12.5 MG tablet [id number] No Take 12.5 mg by mouth 2 (two) times daily as needed for dizziness or nausea. [provider] Taking Active Self  metoprolol succinate (TOPROL-XL) 50 MG 24 hr tablet [id number] No Take 1 tablet (50 mg total) by mouth 2 (two) times daily.  Patient taking differently: Take 50 mg by mouth daily.   [name], MD Taking Active Self           Med Note Earlene Plater, Irena Reichmann Jul 08, 2023  9:22 AM) Reports takes one tablet once a day  montelukast (SINGULAIR) 10 MG tablet [id number] No  Take 1 tablet (10 mg total) by mouth at bedtime. Please start taking this on September 4. Josph Macho, MD Taking Active   Multiple Vitamins-Minerals (CENTRUM SILVER WOMEN 50+ PO) [id number] No Take 1 tablet by mouth daily at 6 (six) AM. [provider] Taking Active Self  neomycin-polymyxin-hydrocortisone (CORTISPORIN) OTIC solution [id number] No Place 4 drops into both ears 4 (four) times daily. Josph Macho, MD Taking Active   nitroGLYCERIN (NITROSTAT) SL tablet 0.4 mg [id number]   [name], MD  Active   nortriptyline (PAMELOR) 10 MG capsule [id number] No Take 1 capsule (10 mg total) by mouth at bedtime. [name], MD Taking Active            Med Note Earlene Plater, Irena Reichmann Jul 08, 2023  9:34 AM) Reports takes as needed  ondansetron (ZOFRAN) 8 MG tablet [id number] No Take 1 tablet (8 mg total) by mouth every 8 (eight) hours as needed for nausea or vomiting. Josph Macho, MD Taking Active   oxyCODONE-acetaminophen (PERCOCET) 5-325 MG tablet [id number] No Take 2 tablets by mouth every 6 (six)  hours as needed. Charlynne Pander, MD Taking Active   oxyCODONE-acetaminophen (PERCOCET/ROXICET) 5-325 MG tablet [id number] No Take 1 tablet by mouth every 6 (six) hours as needed for severe pain. [provider] Taking Active Self           Med Note Luciana Axe Mar 28, 2023  2:30 PM)    pantoprazole (PROTONIX) 40 MG tablet [id number] No Take 1 tablet (40 mg total) by mouth daily. Sandford Craze, NP Taking Active   potassium chloride SA (KLOR-CON M) 20 MEQ tablet [id number] No Take 1 tablet (20 mEq total) by mouth daily. [name], MD Taking Active Self  prochlorperazine (COMPAZINE) 10 MG tablet [id number] No Take 0.5 tablets (5 mg total) by mouth 2 (two) times daily as needed for nausea or vomiting. [name], MD Taking Active Self  Respiratory Therapy Supplies (PILLOW MASK/ADULT) MISC [id number]  . Provide PAP supplies (expires 12 months  from 09/21/2023) . [provider]  Active   Saccharomyces boulardii (FLORASTOR PO) [id number] No Take 500 mg by mouth daily at 6 (six) AM. [provider] Taking Active Self  scopolamine (TRANSDERM-SCOP) 1 MG/3DAYS [id number] No Place 1 patch (1.5 mg total) onto the skin every 3 (three) days. Josph Macho, MD Taking Active   sertraline (ZOLOFT) 50 MG tablet [id number] No Take 1 tablet (50 mg total) by mouth daily. [name], MD Taking Active   spironolactone (ALDACTONE) 25 MG tablet [id number] No Take 25 mg by mouth daily. [provider] Taking Active Self            Home Care and Equipment/Supplies: Were Home Health Services Ordered?: NA Any new equipment or medical supplies ordered?: NA  Functional Questionnaire: Do you need assistance with bathing/showering or dressing?: No Do you need assistance with meal preparation?: No Do you need assistance with eating?: No Do you have difficulty maintaining continence: No Do you need assistance with getting out of bed/getting out of a chair/moving?: No Do you have difficulty managing or taking your medications?: No  Follow up appointments reviewed: PCP Follow-up appointment confirmed?: Yes Date of PCP follow-up appointment?: 10/28/23 Follow-up Provider: Danise Edge Specialist [facility] Follow-up appointment confirmed?: No Reason Specialist Follow-Up Not Confirmed: Patient has Specialist Provider Number and will Call for Appointment Do you need transportation to your follow-up appointment?: No Do you understand care options if your condition(s) worsen?: Yes-patient verbalized understanding     Sanaiya Welliver, CMA  [facility] AWV Team Direct Dial: [phone]

## 2023-10-20 NOTE — Therapy (Incomplete)
OUTPATIENT PHYSICAL THERAPY SHOULDER EVALUATION   Patient Name: Tami [name] MRN: [id number] DOB:09/11/1956, 67 y.o., female Today's Date: 10/20/2023  END OF SESSION:   Past Medical History:  Diagnosis Date   Abnormal SPEP 07/26/2016   Arthritis    knees, hands   Back pain 07/14/2016   Bowel obstruction (HCC) 11/2014   C. 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   Multiple myeloma (HCC) 07/20/2018   Multiple myeloma not having achieved remission (HCC) 07/20/2018   Myalgia 12/31/2016   Obstructive sleep apnea 03/31/2015   Presence of permanent cardiac pacemaker    SOB (shortness of breath) 04/09/2016   Stroke (HCC)    TIAs   UTI (urinary tract infection)    Past Surgical History:  Procedure Laterality Date   ABDOMINAL HYSTERECTOMY     menorraghia, 2006, total   BIOPSY  09/01/2022   Procedure: BIOPSY;  Surgeon: Lynann Bologna, MD;  Location: [facility];  Service: Gastroenterology;;   CHOLECYSTECTOMY     COLONOSCOPY  2018   [facility] per patient   COLONOSCOPY WITH PROPOFOL N/A 09/01/2022   Procedure: COLONOSCOPY WITH PROPOFOL;  Surgeon: Lynann Bologna, MD;  Location: [facility];  Service: Gastroenterology;  Laterality: N/A;   ESOPHAGOGASTRODUODENOSCOPY  2018   [facility]    ESOPHAGOGASTRODUODENOSCOPY (EGD) WITH PROPOFOL N/A 09/01/2022   Procedure: ESOPHAGOGASTRODUODENOSCOPY (EGD) WITH PROPOFOL;  Surgeon: Lynann Bologna, MD;  Location: [facility];  Service: Gastroenterology;  Laterality:  N/A;   IR IMAGING GUIDED PORT INSERTION  06/29/2023   KNEE SURGERY     right, repair torn torn cartialage   PACEMAKER INSERTION  08/2014   changed last in 2023 per pt at [facility]   TONSILLECTOMY     TUBAL LIGATION     Patient Active Problem List   Diagnosis Date Noted   Acute pain of left shoulder 09/03/2023   Chronic combined systolic and diastolic congestive heart failure (HCC) 03/28/2023   Intractable nausea and vomiting 03/27/2023   Asthma 12/20/2022   Diarrhea    Hypocalcemia 08/25/2022   Iron deficiency anemia due to chronic blood loss 05/28/2022   Chronic diarrhea 05/27/2022   Soft tissue lesion 04/28/2022   Obesity (BMI 30-39.9) 04/28/2022   SBO (small bowel obstruction) (HCC) 04/27/2022   Dehydration 04/03/2022   Acute vaginitis 03/18/2022   Cervical cancer screening 03/18/2022   Dizziness 12/11/2021   Visual changes 11/26/2021   Ear fullness, bilateral 08/12/2021   Bilateral knee pain 08/08/2021   History of 2019 novel coronavirus disease (COVID-19) 12/06/2020   Vitamin deficiency 07/31/2020   Acute combined systolic and diastolic heart failure (HCC) 05/09/2020   Hypomagnesemia 03/04/2020   Thrombocytopenia (HCC) 03/04/2020   Dysphagia 03/04/2020   Constipation 03/04/2020   Sensation of pressure in bladder area 01/17/2020   Hyperglycemia 01/17/2020   Peripheral neuropathy 11/15/2019   Chronic migraine without aura, with intractable migraine, so stated, with status migrainosus 10/17/2019   ETD (Eustachian tube dysfunction), bilateral 09/12/2019   Urinary frequency 07/17/2019   Headache 07/17/2019  Palpitation 05/15/2019   Clostridium difficile diarrhea 09/04/2018   Thyroid nodule 08/26/2018   Multiple myeloma (HCC) 07/20/2018   Multiple myeloma not having achieved remission (HCC) 07/20/2018   Hematuria 07/11/2018   Seasonal allergies 06/23/2018   Right shoulder pain 06/07/2018   Neck pain 06/07/2018   Paresthesias 06/07/2018   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   Pain in thoracic spine 03/18/2018   Thoracic back pain 02/15/2018   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   Hypokalemia 08/20/2017   Anemia 08/20/2017   Elevated sed rate 08/15/2017   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   Epigastric abdominal pain 03/22/2017   Myalgia 12/31/2016   Abnormal SPEP 07/26/2016   Low back pain 07/14/2016   Insomnia 12/29/2015   Tension headache 10/21/2015   Muscle spasms of neck 10/18/2015   Dysuria 10/13/2015   AP (abdominal pain) 07/07/2015   Cardiomyopathy (HCC) 04/19/2015   Chest pain 04/19/2015   Obstructive sleep apnea 03/31/2015   Congestive heart failure (HCC) 02/24/2015   Arthritis    Hyperlipidemia    Hypertension    Depression    Stroke [facility])    GERD (gastroesophageal reflux disease)    Bowel obstruction (HCC) 11/09/2014    PCP: [name], MD    REFERRING PROVIDER: Ralene Cork, DO  REFERRING DIAG: [phone] (ICD-10-CM) - Rotator cuff tendinitis, left   THERAPY DIAG:  No diagnosis found.  Rationale for Evaluation and Treatment: Rehabilitation  ONSET DATE: beginning of October 2024  SUBJECTIVE:                                                                                                                                                                                      SUBJECTIVE STATEMENT: *** Patient received subacromial cortisone injection on 09/07/23. Hand dominance: Right  PERTINENT HISTORY: Multiple myelomo, stroke, GERD, thrombocytopenia, congestive heart failure, ICD, obesity, migraines  PAIN:  Are you having pain? {OPRCPAIN:27236}  PRECAUTIONS: ICD/Pacemaker  RED FLAGS: {PT Red Flags:29287}   WEIGHT BEARING RESTRICTIONS: {Yes ***/No:24003}  FALLS:  Has patient fallen in last 6 months? {fallsyesno:27318}  LIVING ENVIRONMENT: Lives with: {OPRC lives with:25569::"lives with their family"} Lives in: {Lives in:25570} Stairs: {opstairs:27293} Has following  equipment at home: {Assistive devices:23999}  OCCUPATION: ***  PLOF: {PLOF:24004}  PATIENT GOALS:***  NEXT MD VISIT:   OBJECTIVE:   DIAGNOSTIC FINDINGS:  08/31/23 DG L shoulder IMPRESSION: Mild degenerative changes of the acromioclavicular joint.  PATIENT SURVEYS:  Quick Dash ***  COGNITION: Overall cognitive status: {cognition:24006}     SENSATION: {sensation:27233}  POSTURE: ***  UPPER EXTREMITY ROM:   {AROM/PROM:27142} ROM Right eval Left eval  Shoulder flexion  Shoulder extension    Shoulder abduction    Shoulder adduction    Shoulder internal rotation    Shoulder external rotation    Elbow flexion    Elbow extension    Wrist flexion    Wrist extension    (Blank rows = not tested)  UPPER EXTREMITY MMT:  MMT Right eval Left eval  Shoulder flexion    Shoulder extension    Shoulder abduction    Shoulder internal rotation    Shoulder external rotation    Elbow flexion    Elbow extension    Wrist flexion    Wrist extension    Grip strength (lbs)    (Blank rows = not tested)  SHOULDER SPECIAL TESTS: Impingement tests: {shoulder impingement test:25231:a} SLAP lesions: {SLAP lesions:25232} Instability tests: {shoulder instability test:25233} Rotator cuff assessment: {rotator cuff assessment:25234} Biceps assessment: {biceps assessment:25235}  JOINT MOBILITY TESTING:  ***  PALPATION:  ***   TODAY'S TREATMENT:                                                                                                                                         DATE: ***   PATIENT EDUCATION: Education details: *** Person educated: {Person educated:25204} Education method: {Education Method:25205} Education comprehension: {Education Comprehension:25206}  HOME EXERCISE PROGRAM: ***  ASSESSMENT:  CLINICAL IMPRESSION: Patient is a 67 y.o. right hand dominant female who was seen today for physical therapy evaluation and treatment for L shoulder pain.   ***   [name] would benefit from skilled therapy to improve Left shoulder ROM, decrease pain and improve functional mobility and ability to perform ADLs.   OBJECTIVE IMPAIRMENTS: decreased ROM, decreased strength, impaired perceived functional ability, increased muscle spasms, impaired UE functional use, and pain.   ACTIVITY LIMITATIONS: carrying, lifting, dressing, reach over head, and caring for others  PARTICIPATION LIMITATIONS: meal prep and cleaning  PERSONAL FACTORS: Age and 1-2 comorbidities: *** are also affecting patient's functional outcome.   REHAB POTENTIAL: {rehabpotential:25112}  CLINICAL DECISION MAKING: {clinical decision making:25114}  EVALUATION COMPLEXITY: {Evaluation complexity:25115}   GOALS: Goals reviewed with patient? {yes/no:20286}  SHORT TERM GOALS: Target date: {follow up:25551}   Patient will be independent with initial HEP.  Baseline: *** Goal status: {GOALSTATUS:25110}  2.  ***  Baseline: *** Goal status: {GOALSTATUS:25110}  3.  *** Baseline: *** Goal status: {GOALSTATUS:25110}   LONG TERM GOALS: Target date: {follow up:25551}   Patient will be independent with advanced/ongoing HEP to improve outcomes and carryover.  Baseline: *** Goal status: {GOALSTATUS:25110}  2.  Patient will report 75% improvement in *** shoulder pain to improve QOL.  Baseline: *** Goal status: {GOALSTATUS:25110}  3.  Patient to improve *** shoulder AROM to {Functional status:27472} without pain provocation to allow for increased ease of ADLs.  Baseline: *** Goal status: {GOALSTATUS:25110}  4.  Patient will demonstrate improved functional UE strength as demonstrated by ***. Baseline: *** Goal  status: {GOALSTATUS:25110}  5.  Patient will report at least 10 points improvement on QuickDash to demonstrate improved functional ability.  Baseline: *** Goal status: {GOALSTATUS:25110}  6..  Patient will ***   Baseline: *** Goal status: {GOALSTATUS:25110}    7. Patient will *** Baseline: *** Goal status: {GOALSTATUS:25110}   PLAN:  PT FREQUENCY: {rehab frequency:25116}  PT DURATION: {rehab duration:25117}  PLANNED INTERVENTIONS: {rehab planned interventions:25118::"97110-Therapeutic exercises","97530- Therapeutic [phone]- Neuromuscular re-education","97535- Self JXBJ","47829- Manual therapy"}  PLAN FOR NEXT SESSION: ***   Jena Gauss, PT, DPT 10/20/2023, 1:58 PM

## 2023-10-21 LAB — PROTEIN ELECTROPHORESIS, SERUM, WITH REFLEX
A/G Ratio: 1.1 (ref 0.7–1.7)
Albumin ELP: 3.4 g/dL (ref 2.9–4.4)
Alpha-1-Globulin: 0.2 g/dL (ref 0.0–0.4)
Alpha-2-Globulin: 0.8 g/dL (ref 0.4–1.0)
Beta Globulin: 0.9 g/dL (ref 0.7–1.3)
Gamma Globulin: 1.1 g/dL (ref 0.4–1.8)
Globulin, Total: 3.1 g/dL (ref 2.2–3.9)
M-Spike, %: 0.8 g/dL — ABNORMAL HIGH
SPEP Interpretation: 0
Total Protein ELP: 6.5 g/dL (ref 6.0–8.5)

## 2023-10-21 LAB — IMMUNOFIXATION REFLEX, SERUM
IgA: 39 mg/dL — ABNORMAL LOW (ref 87–352)
IgG (Immunoglobin G), Serum: 1300 mg/dL (ref 586–1602)
IgM (Immunoglobulin M), Srm: 28 mg/dL (ref 26–217)

## 2023-10-22 ENCOUNTER — Inpatient Hospital Stay: Payer: 59

## 2023-10-22 ENCOUNTER — Ambulatory Visit: Payer: 59

## 2023-10-22 ENCOUNTER — Other Ambulatory Visit: Payer: 59

## 2023-10-25 ENCOUNTER — Ambulatory Visit: Payer: 59 | Attending: Family Medicine | Admitting: Physical Therapy

## 2023-10-25 ENCOUNTER — Other Ambulatory Visit: Payer: Self-pay | Admitting: Family Medicine

## 2023-10-25 ENCOUNTER — Other Ambulatory Visit: Payer: Self-pay

## 2023-10-25 ENCOUNTER — Telehealth: Payer: Self-pay

## 2023-10-25 DIAGNOSIS — I5042 Chronic combined systolic (congestive) and diastolic (congestive) heart failure: Secondary | ICD-10-CM | POA: Diagnosis not present

## 2023-10-25 DIAGNOSIS — R112 Nausea with vomiting, unspecified: Secondary | ICD-10-CM | POA: Diagnosis not present

## 2023-10-25 DIAGNOSIS — K219 Gastro-esophageal reflux disease without esophagitis: Secondary | ICD-10-CM | POA: Diagnosis not present

## 2023-10-25 DIAGNOSIS — I10 Essential (primary) hypertension: Secondary | ICD-10-CM | POA: Diagnosis not present

## 2023-10-25 DIAGNOSIS — C9000 Multiple myeloma not having achieved remission: Secondary | ICD-10-CM

## 2023-10-25 DIAGNOSIS — Z8679 Personal history of other diseases of the circulatory system: Secondary | ICD-10-CM | POA: Diagnosis not present

## 2023-10-25 DIAGNOSIS — R197 Diarrhea, unspecified: Secondary | ICD-10-CM | POA: Diagnosis not present

## 2023-10-25 DIAGNOSIS — I480 Paroxysmal atrial fibrillation: Secondary | ICD-10-CM | POA: Diagnosis not present

## 2023-10-25 NOTE — Telephone Encounter (Signed)
Received phone call from patient stating that she was still feeling weak and nauseous. Pt states she has been feeling this way for the past 2 days. Pt states she has been able to drink and was in the ER last week. Pt states Dr. Myna Hidalgo aware and it is ok for her to come in for labs.Pt states she is currently in her GI MD office to be evaluated. Dr. Myna Hidalgo aware of pt current symptoms and states pt can come in for labs and fluids.  Pt advised to be evaluated by her GI MD and to call back and let us know what they advised. If pt still feels need to have labs evaluated she can let us know and she can be added onto the schedule. Pt verbalized understanding and agreeable to plan.

## 2023-10-26 ENCOUNTER — Inpatient Hospital Stay: Payer: 59

## 2023-10-26 ENCOUNTER — Ambulatory Visit: Payer: 59 | Admitting: Family

## 2023-10-26 VITALS — BP 152/84 | HR 91 | Temp 98.6°F | Resp 18

## 2023-10-26 DIAGNOSIS — Z5111 Encounter for antineoplastic chemotherapy: Secondary | ICD-10-CM | POA: Diagnosis not present

## 2023-10-26 DIAGNOSIS — C9000 Multiple myeloma not having achieved remission: Secondary | ICD-10-CM

## 2023-10-26 DIAGNOSIS — Z5112 Encounter for antineoplastic immunotherapy: Secondary | ICD-10-CM | POA: Diagnosis not present

## 2023-10-26 DIAGNOSIS — Z79899 Other long term (current) drug therapy: Secondary | ICD-10-CM | POA: Diagnosis not present

## 2023-10-26 LAB — CMP (CANCER CENTER ONLY)
ALT: 16 U/L (ref 0–44)
AST: 21 U/L (ref 15–41)
Albumin: 4 g/dL (ref 3.5–5.0)
Alkaline Phosphatase: 45 U/L (ref 38–126)
Anion gap: 10 (ref 5–15)
BUN: 22 mg/dL (ref 8–23)
CO2: 26 mmol/L (ref 22–32)
Calcium: 9.3 mg/dL (ref 8.9–10.3)
Chloride: 105 mmol/L (ref 98–111)
Creatinine: 0.65 mg/dL (ref 0.44–1.00)
GFR, Estimated: >60 mL/min (ref >60)
Glucose, Bld: 122 mg/dL — ABNORMAL HIGH (ref 70–99)
Potassium: 4.3 mmol/L (ref 3.5–5.1)
Sodium: 141 mmol/L (ref 135–145)
Total Bilirubin: 0.4 mg/dL (ref <1.2)
Total Protein: 6.9 g/dL (ref 6.5–8.1)

## 2023-10-26 LAB — CBC WITH DIFFERENTIAL (CANCER CENTER ONLY)
Abs Immature Granulocytes: 0.01 10*3/uL (ref 0.00–0.07)
Basophils Absolute: 0 10*3/uL (ref 0.0–0.1)
Basophils Relative: 1 %
Eosinophils Absolute: 0.1 10*3/uL (ref 0.0–0.5)
Eosinophils Relative: 3 %
HCT: 34 % — ABNORMAL LOW (ref 36.0–46.0)
Hemoglobin: 11.3 g/dL — ABNORMAL LOW (ref 12.0–15.0)
Immature Granulocytes: 0 %
Lymphocytes Relative: 24 %
Lymphs Abs: 0.7 10*3/uL (ref 0.7–4.0)
MCH: 30.5 pg (ref 26.0–34.0)
MCHC: 33.2 g/dL (ref 30.0–36.0)
MCV: 91.9 fL (ref 80.0–100.0)
Monocytes Absolute: 0.4 10*3/uL (ref 0.1–1.0)
Monocytes Relative: 12 %
Neutro Abs: 1.8 10*3/uL (ref 1.7–7.7)
Neutrophils Relative %: 60 %
Platelet Count: 144 10*3/uL — ABNORMAL LOW (ref 150–400)
RBC: 3.7 MIL/uL — ABNORMAL LOW (ref 3.87–5.11)
RDW: 13.9 % (ref 11.5–15.5)
WBC Count: 3 10*3/uL — ABNORMAL LOW (ref 4.0–10.5)
nRBC: 0 % (ref 0.0–0.2)

## 2023-10-26 LAB — MAGNESIUM: Magnesium: 1.8 mg/dL (ref 1.7–2.4)

## 2023-10-26 MED ORDER — SODIUM CHLORIDE 0.9% FLUSH
10.0000 mL | Freq: Once | INTRAVENOUS | Status: AC
  Administered 2023-10-26: 10 mL

## 2023-10-26 MED ORDER — HEPARIN SOD (PORK) LOCK FLUSH 100 UNIT/ML IV SOLN
500.0000 [IU] | Freq: Once | INTRAVENOUS | Status: AC
Start: 2023-10-26 — End: 2023-10-26
  Administered 2023-10-26: 500 [IU] via INTRAVENOUS

## 2023-10-26 NOTE — Patient Instructions (Signed)

## 2023-10-27 NOTE — Assessment & Plan Note (Deleted)
Supplement and monitor 

## 2023-10-27 NOTE — Assessment & Plan Note (Deleted)
Recently hospitalized with an exacerbation.

## 2023-10-27 NOTE — Assessment & Plan Note (Deleted)
Mild, recurrent, Increase leafy greens, consider increased lean red meat and using cast iron cookware. Continue to monitor, report any concerns

## 2023-10-27 NOTE — Assessment & Plan Note (Deleted)
hgba1c acceptable, minimize simple carbs. Increase exercise as tolerated.  

## 2023-10-27 NOTE — Assessment & Plan Note (Deleted)
Well controlled, no changes to meds. Encouraged heart healthy diet such as the DASH diet and exercise as tolerated.  °

## 2023-10-28 ENCOUNTER — Inpatient Hospital Stay: Payer: 59 | Admitting: Family Medicine

## 2023-10-28 DIAGNOSIS — D649 Anemia, unspecified: Secondary | ICD-10-CM

## 2023-10-28 DIAGNOSIS — K529 Noninfective gastroenteritis and colitis, unspecified: Secondary | ICD-10-CM

## 2023-10-28 DIAGNOSIS — I10 Essential (primary) hypertension: Secondary | ICD-10-CM

## 2023-10-28 DIAGNOSIS — R739 Hyperglycemia, unspecified: Secondary | ICD-10-CM

## 2023-10-29 ENCOUNTER — Encounter: Payer: Self-pay | Admitting: Family

## 2023-10-29 ENCOUNTER — Ambulatory Visit (INDEPENDENT_AMBULATORY_CARE_PROVIDER_SITE_OTHER): Payer: 59 | Admitting: Family

## 2023-10-29 ENCOUNTER — Ambulatory Visit: Payer: Self-pay | Admitting: Family Medicine

## 2023-10-29 VITALS — BP 138/82 | HR 94 | Temp 98.0°F | Ht 66.0 in | Wt 191.4 lb

## 2023-10-29 DIAGNOSIS — B379 Candidiasis, unspecified: Secondary | ICD-10-CM | POA: Diagnosis not present

## 2023-10-29 DIAGNOSIS — R300 Dysuria: Secondary | ICD-10-CM | POA: Diagnosis not present

## 2023-10-29 LAB — POCT URINALYSIS DIPSTICK
Bilirubin, UA: NEGATIVE
Blood, UA: NEGATIVE
Glucose, UA: NEGATIVE
Ketones, UA: NEGATIVE
Nitrite, UA: POSITIVE
Protein, UA: NEGATIVE
Spec Grav, UA: 1.02 (ref 1.010–1.025)
Urobilinogen, UA: 0.2 U/dL
pH, UA: 5 (ref 5.0–8.0)

## 2023-10-29 MED ORDER — NITROFURANTOIN MONOHYD MACRO 100 MG PO CAPS: 100.0000 mg | ORAL_CAPSULE | Freq: Two times a day (BID) | ORAL | 0 refills | Status: DC

## 2023-10-29 MED ORDER — FLUCONAZOLE 150 MG PO TABS: ORAL_TABLET | ORAL | 0 refills | Status: DC

## 2023-10-29 NOTE — Progress Notes (Signed)
Tami [name] is a 67 y.o. female with the following history as recorded in EpicCare:  Patient Active Problem List   Diagnosis Date Noted   Acute pain of left shoulder 09/03/2023   Chronic combined systolic and diastolic congestive heart failure (HCC) 03/28/2023   Intractable nausea and vomiting 03/27/2023   Asthma 12/20/2022   Diarrhea    Hypocalcemia 08/25/2022   Iron deficiency anemia due to chronic blood loss 05/28/2022   Chronic diarrhea 05/27/2022   Soft tissue lesion 04/28/2022   Obesity (BMI 30-39.9) 04/28/2022   SBO (small bowel obstruction) (HCC) 04/27/2022   Dehydration 04/03/2022   Acute vaginitis 03/18/2022   Cervical cancer screening 03/18/2022   Dizziness 12/11/2021   Visual changes 11/26/2021   Ear fullness, bilateral 08/12/2021   Bilateral knee pain 08/08/2021   History of 2019 novel coronavirus disease (COVID-19) 12/06/2020   Vitamin deficiency 07/31/2020   Acute combined systolic and diastolic heart failure (HCC) 05/09/2020   Hypomagnesemia 03/04/2020   Thrombocytopenia (HCC) 03/04/2020   Dysphagia 03/04/2020   Constipation 03/04/2020   Sensation of pressure in bladder area 01/17/2020   Hyperglycemia 01/17/2020   Peripheral neuropathy 11/15/2019   Chronic migraine without aura, with intractable migraine, so stated, with status migrainosus 10/17/2019   ETD (Eustachian tube dysfunction), bilateral 09/12/2019   Urinary frequency 07/17/2019   Headache 07/17/2019   Palpitation 05/15/2019   Clostridium difficile diarrhea 09/04/2018   Thyroid nodule 08/26/2018   Multiple myeloma (HCC) 07/20/2018   Multiple myeloma not having achieved remission (HCC) 07/20/2018   Hematuria 07/11/2018   Seasonal allergies 06/23/2018   Right shoulder pain 06/07/2018   Neck pain 06/07/2018   Paresthesias 06/07/2018   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   Pain in thoracic spine 03/18/2018   Thoracic back pain 02/15/2018   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    Hypokalemia 08/20/2017   Anemia 08/20/2017   Elevated sed rate 08/15/2017   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   Epigastric abdominal pain 03/22/2017   Myalgia 12/31/2016   Abnormal SPEP 07/26/2016   Low back pain 07/14/2016   Insomnia 12/29/2015   Tension headache 10/21/2015   Muscle spasms of neck 10/18/2015   Dysuria 10/13/2015   AP (abdominal pain) 07/07/2015   Cardiomyopathy (HCC) 04/19/2015   Chest pain 04/19/2015   Obstructive sleep apnea 03/31/2015   Congestive heart failure (HCC) 02/24/2015   Arthritis    Hyperlipidemia    Hypertension    Depression    Stroke [facility])    GERD (gastroesophageal reflux disease)    Bowel obstruction (HCC) 11/09/2014    Current Outpatient Medications  Medication Sig Dispense Refill   acetaminophen (TYLENOL) 500 MG tablet Take 500 mg by mouth every 6 (six) hours as needed.     acyclovir (ZOVIRAX) 400 MG tablet Take 1 tablet (400 mg total) by mouth 2 (two) times daily. 60 tablet 3   albuterol (PROVENTIL) (2.5 MG/3ML) 0.083% nebulizer solution Take 3 mLs (2.5 mg total) by nebulization every 6 (six) hours as needed for wheezing or shortness of breath. 150 mL 2   albuterol (VENTOLIN HFA) 108 (90 Base) MCG/ACT inhaler Inhale 2 puffs into the lungs every 6 (six) hours as needed for wheezing or shortness of breath. 18 g 5   ALPRAZolam (XANAX) 0.25 MG tablet Take 1-2 tablets (0.25-0.5 mg total) by mouth 3 (three) times daily as needed for anxiety. 40 tablet 1   amLODipine (NORVASC) 5 MG tablet Take 5 mg by mouth daily.     aspirin  EC 81 MG tablet Take 81 mg by mouth daily. Swallow whole.     budesonide (PULMICORT) 0.5 MG/2ML nebulizer solution Take 2 mLs (0.5 mg total) by nebulization 2 (two) times daily as needed (cough, wheezing or shortness of breath). 120 mL 11   celecoxib (CELEBREX) 100 MG capsule Take 100 mg by mouth 2 (two) times daily.     cetirizine (ZYRTEC) 10 MG tablet Take 10 mg by mouth at bedtime.      Cholecalciferol (VITAMIN D3) 50 MCG (2000 UT) TABS Take 2,000 Units by mouth in the morning.     cholestyramine light (PREVALITE) 4 g packet Take 4 g by mouth 2 (two) times daily.     diclofenac Sodium (VOLTAREN) 1 % GEL Apply 2 g topically as directed.     dicyclomine (BENTYL) 10 MG capsule Take 1 capsule (10 mg total) by mouth 4 (four) times daily -  before meals and at bedtime. cramping 90 capsule 1   diphenoxylate-atropine (LOMOTIL) 2.5-0.025 MG tablet Take 1 tablet by mouth 2 (two) times daily. 60 tablet 6   famotidine (PEPCID) 20 MG tablet Take 1 tablet (20 mg total) by mouth at bedtime. 30 tablet 2   fluconazole (DIFLUCAN) 150 MG tablet Take 1 tablet today as directed; repeat after 72 hours 2 tablet 0   fluticasone (FLONASE) 50 MCG/ACT nasal spray Place 2 sprays into both nostrils daily. 16 g 0   furosemide (LASIX) 20 MG tablet Take 20 mg by mouth daily as needed (fluid retention (feet swelling)).     HYDROcodone bit-homatropine (HYDROMET) 5-1.5 MG/5ML syrup Take 5 mLs by mouth every 8 (eight) hours as needed for cough. 120 mL 0   hydrOXYzine (ATARAX) 10 MG tablet TAKE ONE (1) TABLET BY MOUTH 3 TIMES DAILY AS NEEDED FOR ITCHING 30 tablet 2   hyoscyamine (LEVSIN SL) 0.125 MG SL tablet Place 1 tablet (0.125 mg total) under the tongue every 4 (four) hours as needed. 30 tablet 1   lidocaine-prilocaine (EMLA) cream Apply 1 Application topically as needed. 30 g 0   loperamide (IMODIUM) 2 MG capsule Take 4 mg by mouth in the morning and at bedtime.     losartan (COZAAR) 50 MG tablet Take 50 mg by mouth in the morning and at bedtime.     Magnesium Glycinate 100 MG CAPS Take 200 mg by mouth at bedtime.     meclizine (ANTIVERT) 12.5 MG tablet Take 12.5 mg by mouth 2 (two) times daily as needed for dizziness or nausea.     metoprolol succinate (TOPROL-XL) 50 MG 24 hr tablet Take 1 tablet (50 mg total) by mouth 2 (two) times daily. (Patient taking differently: Take 50 mg by mouth daily.) 60 tablet 2    montelukast (SINGULAIR) 10 MG tablet Take 1 tablet (10 mg total) by mouth at bedtime. Please start taking this on September 4. 30 tablet 3   Multiple Vitamins-Minerals (CENTRUM SILVER WOMEN 50+ PO) Take 1 tablet by mouth daily at 6 (six) AM.     neomycin-polymyxin-hydrocortisone (CORTISPORIN) OTIC solution Place 4 drops into both ears 4 (four) times daily. 10 mL 4   nitrofurantoin, macrocrystal-monohydrate, (MACROBID) 100 MG capsule Take 1 capsule (100 mg total) by mouth 2 (two) times daily. 14 capsule 0   nortriptyline (PAMELOR) 10 MG capsule Take 1 capsule (10 mg total) by mouth at bedtime. 30 capsule 1   omeprazole (PRILOSEC) 20 MG capsule SMARTSIG:1.0 Capsule(s) By Mouth Daily     ondansetron (ZOFRAN) 8 MG tablet Take 1 tablet (  8 mg total) by mouth every 8 (eight) hours as needed for nausea or vomiting. 30 tablet 1   oxyCODONE-acetaminophen (PERCOCET) 5-325 MG tablet Take 2 tablets by mouth every 6 (six) hours as needed. 10 tablet 0   oxyCODONE-acetaminophen (PERCOCET/ROXICET) 5-325 MG tablet Take 1 tablet by mouth every 6 (six) hours as needed for severe pain.     pantoprazole (PROTONIX) 40 MG tablet Take 1 tablet (40 mg total) by mouth daily. 30 tablet 3   potassium chloride SA (KLOR-CON M) 20 MEQ tablet TAKE 1 TABLET BY MOUTH EVERY MONDAY, WEDNESDAY, AND FRIDAY. 30 tablet 5   prochlorperazine (COMPAZINE) 10 MG tablet Take 0.5 tablets (5 mg total) by mouth 2 (two) times daily as needed for nausea or vomiting. 60 tablet 1   Respiratory Therapy Supplies (PILLOW MASK/ADULT) MISC . Provide PAP supplies (expires 12 months from 09/21/2023) .     Saccharomyces boulardii (FLORASTOR PO) Take 500 mg by mouth daily at 6 (six) AM.     scopolamine (TRANSDERM-SCOP) 1 MG/3DAYS Place 1 patch (1.5 mg total) onto the skin every 3 (three) days. 10 patch 1   sertraline (ZOLOFT) 50 MG tablet Take 1 tablet (50 mg total) by mouth daily. 30 tablet 3   spironolactone (ALDACTONE) 25 MG tablet Take 25 mg by mouth  daily.     Current Facility-Administered Medications  Medication Dose Route Frequency Provider Last Rate Last Admin   nitroGLYCERIN (NITROSTAT) SL tablet 0.4 mg  0.4 mg Sublingual Once [name], MD       Facility-Administered Medications Ordered in Other Visits  Medication Dose Route Frequency Provider Last Rate Last Admin   0.9 %  sodium chloride infusion   Intravenous Once Josph Macho, MD       acetaminophen (TYLENOL) tablet 650 mg  650 mg Oral Once Josph Macho, MD       sodium chloride flush (NS) 0.9 % injection 10 mL  10 mL Intracatheter PRN Josph Macho, MD   10 mL at 09/24/23 1640    Allergies: Benazepril, Ondansetron hcl, Codeine, and Morphine  Past Medical History:  Diagnosis Date   Abnormal SPEP 07/26/2016   Arthritis    knees, hands   Back pain 07/14/2016   Bowel obstruction (HCC) 11/2014   C. 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   Multiple myeloma (HCC) 07/20/2018   Multiple myeloma not having achieved remission (HCC) 07/20/2018   Myalgia 12/31/2016   Obstructive sleep apnea 03/31/2015   Presence of permanent cardiac pacemaker    SOB (shortness of breath) 04/09/2016   Stroke (HCC)    TIAs   UTI (urinary tract infection)     Past Surgical History:  Procedure Laterality Date   ABDOMINAL HYSTERECTOMY     menorraghia, 2006, total   BIOPSY  09/01/2022   Procedure: BIOPSY;  Surgeon: Lynann Bologna, MD;  Location: [facility];  Service: Gastroenterology;;   CHOLECYSTECTOMY     COLONOSCOPY   2018   [facility] per patient  COLONOSCOPY WITH PROPOFOL N/A 09/01/2022   Procedure: COLONOSCOPY WITH PROPOFOL;  Surgeon: Lynann Bologna, MD;  Location: [facility];  Service: Gastroenterology;  Laterality: N/A;   ESOPHAGOGASTRODUODENOSCOPY  2018   [facility]    ESOPHAGOGASTRODUODENOSCOPY (EGD) WITH PROPOFOL N/A 09/01/2022   Procedure: ESOPHAGOGASTRODUODENOSCOPY (EGD) WITH PROPOFOL;  Surgeon: Lynann Bologna, MD;  Location: [facility];  Service: Gastroenterology;  Laterality: N/A;   IR IMAGING GUIDED PORT INSERTION  06/29/2023   KNEE SURGERY     right, repair torn torn cartialage   PACEMAKER INSERTION  08/2014   changed last in 2023 [facility]   TONSILLECTOMY     TUBAL LIGATION      Family History  Problem Relation Age of Onset   Diabetes Mother    Hypertension Mother    Heart disease Mother        s/p 1 stent   Hyperlipidemia Mother    Arthritis Mother    Cancer Father        COLON   Colon cancer Father 69   Irritable bowel syndrome Sister    Hyperlipidemia Daughter    Hypertension Daughter    Hypertension Maternal Grandmother    Arthritis Maternal Grandmother    Heart disease Maternal Grandfather        MI   Hypertension Maternal Grandfather    Arthritis Maternal Grandfather    Hypertension Son    Esophageal cancer Neg Hx     Social History   Tobacco Use   Smoking status: Never    Passive exposure: Never   Smokeless tobacco: Never  Substance Use Topics   Alcohol use: No    Subjective:   Concern for UTI; symptoms x 3-4 days; having some discomfort with urination/ "just haven't felt well this week." Decreased appetite;  Also worried that she may have a yeast infection- vaginal itching; did take Augmentin earlier this month;    Objective:  Vitals:   10/29/23 0929  BP: 138/82  Pulse: 94  Temp: 98 F (36.7 C)  TempSrc: Oral  SpO2: 96%  Weight: 191 lb 6.4 oz (86.8 kg)  Height: 5\' 6"  (1.676 m)    General: Well developed, well  nourished, in no acute distress  Skin : Warm and dry.  Head: Normocephalic and atraumatic  Lungs: Respirations unlabored; clear to auscultation bilaterally without wheeze, rales, rhonchi  CVS exam: normal rate and regular rhythm.  Neurologic: Alert and oriented; speech intact; face symmetrical; moves all extremities well; CNII-XII intact without focal deficit   Assessment:  1. Dysuria   2. Yeast infection     Plan:  Suspect UTI; check U/A and urine culture; Rx for Macrobid 100 mg bid x 7 days; follow up to be determined based on culture results. Rx for Diflucan 150 mg- take as directed;   Return for Dr. Abner Greenspan missed appointment on 12/19; .  Orders Placed This Encounter  Procedures   Urine Culture   POCT Urinalysis Dipstick    Requested Prescriptions   Signed Prescriptions Disp Refills   nitrofurantoin, macrocrystal-monohydrate, (MACROBID) 100 MG capsule 14 capsule 0    Sig: Take 1 capsule (100 mg total) by mouth 2 (two) times daily.   fluconazole (DIFLUCAN) 150 MG tablet 2 tablet 0    Sig: Take 1 tablet today as directed; repeat after 72 hours

## 2023-10-29 NOTE — Telephone Encounter (Signed)
  Chief Complaint: Possible UTI  OR Yeast Symptoms:  Burning Frequency and Odor, urine is cloudy, flank pain Rates Pain as 8 on 1-10 scale.  Pertinent Negatives: Patient denies blood. Disposition: [] ED /[] Urgent Care (no appt availability in office) / [x] Appointment(In office/virtual)/ []  [facility]/ [] Home Care/ [] Refused Recommended Disposition /[] [place] [facility]/ []  Follow-up with PCP  Reason for Disposition  Side (flank) or lower back pain present  Bad or foul-smelling urine  Answer Assessment - Initial Assessment Questions 1. SYMPTOM: "What's the main symptom you're concerned about?" (e.g., frequency, incontinence)    Frequency . Smell and odor , urine is cloudy,fever 2. ONSET: "When did the  urinary sympton start?" First of the week     3. PAIN: "Is there any pain?" If Yes, ask: "How bad is it?" (Scale: 1-10; mild, moderate,severe)    Denies 4. CAUSE: "What do you think is causing the symptoms?"     Thinks it is UTI or Fever 5. OTHER SYMPTOMS: "Do you have any other symptoms?" (e.g., blood in urine, fever, flank pain, pain with urination)  Flank  Pain   It is 8 out of 10 on pain scale.  Protocols used: Urinary Symptoms-A-AH

## 2023-10-29 NOTE — Telephone Encounter (Signed)
Copied from CRM [id number]. Topic: Clinical - Red Word Triage >> Oct 29, 2023  8:07 AM Kathryne Eriksson wrote: Red Word that prompted transfer to Nurse Triage: Patient states she MAY have UTI or yeast infection. She described burning when she urinate and feel feverish.

## 2023-10-31 LAB — URINE CULTURE
MICRO NUMBER:: 15876787
SPECIMEN QUALITY:: ADEQUATE

## 2023-11-01 ENCOUNTER — Telehealth: Payer: Self-pay | Admitting: Family Medicine

## 2023-11-01 ENCOUNTER — Other Ambulatory Visit: Payer: Self-pay | Admitting: Family

## 2023-11-01 MED ORDER — SULFAMETHOXAZOLE-TRIMETHOPRIM 800-160 MG PO TABS: 1.0000 | ORAL_TABLET | Freq: Two times a day (BID) | ORAL | 0 refills | Status: DC

## 2023-11-01 NOTE — Telephone Encounter (Signed)
Spoke with pt, pt is aware and expressed understanding.  

## 2023-11-01 NOTE — Telephone Encounter (Signed)
[name]) called stating pt wanted to have the UTI medication sent to the following pharmacy:  [facility] [address] [place], [place] [address] P: [phone]

## 2023-11-01 NOTE — Telephone Encounter (Signed)
Copied from CRM [id number]. Topic: Clinical - Prescription Issue >> Nov 01, 2023  3:57 PM Maxwell Marion wrote: Reason for CRM: Pt returned call from [place]. would [facility] [place] [place]. Address is [address]

## 2023-11-04 ENCOUNTER — Encounter: Payer: Self-pay | Admitting: Hematology & Oncology

## 2023-11-05 ENCOUNTER — Encounter: Payer: Self-pay | Admitting: Hematology & Oncology

## 2023-11-05 ENCOUNTER — Other Ambulatory Visit: Payer: Self-pay | Admitting: Hematology & Oncology

## 2023-11-05 DIAGNOSIS — I509 Heart failure, unspecified: Secondary | ICD-10-CM | POA: Diagnosis not present

## 2023-11-05 DIAGNOSIS — K529 Noninfective gastroenteritis and colitis, unspecified: Secondary | ICD-10-CM | POA: Diagnosis not present

## 2023-11-05 DIAGNOSIS — R9431 Abnormal electrocardiogram [ECG] [EKG]: Secondary | ICD-10-CM | POA: Diagnosis not present

## 2023-11-05 DIAGNOSIS — I110 Hypertensive heart disease with heart failure: Secondary | ICD-10-CM | POA: Diagnosis not present

## 2023-11-05 DIAGNOSIS — I10 Essential (primary) hypertension: Secondary | ICD-10-CM | POA: Diagnosis not present

## 2023-11-05 DIAGNOSIS — R1010 Upper abdominal pain, unspecified: Secondary | ICD-10-CM | POA: Diagnosis not present

## 2023-11-06 DIAGNOSIS — R9431 Abnormal electrocardiogram [ECG] [EKG]: Secondary | ICD-10-CM | POA: Diagnosis not present

## 2023-11-06 DIAGNOSIS — I491 Atrial premature depolarization: Secondary | ICD-10-CM | POA: Diagnosis not present

## 2023-11-06 DIAGNOSIS — I493 Ventricular premature depolarization: Secondary | ICD-10-CM | POA: Diagnosis not present

## 2023-11-08 ENCOUNTER — Other Ambulatory Visit: Payer: Self-pay | Admitting: Emergency Medicine

## 2023-11-08 ENCOUNTER — Other Ambulatory Visit: Payer: Self-pay | Admitting: Family Medicine

## 2023-11-08 ENCOUNTER — Telehealth: Payer: Self-pay

## 2023-11-08 ENCOUNTER — Telehealth: Payer: Self-pay | Admitting: *Deleted

## 2023-11-08 ENCOUNTER — Encounter: Payer: Self-pay | Admitting: Hematology & Oncology

## 2023-11-08 DIAGNOSIS — R350 Frequency of micturition: Secondary | ICD-10-CM

## 2023-11-08 DIAGNOSIS — C9000 Multiple myeloma not having achieved remission: Secondary | ICD-10-CM

## 2023-11-08 DIAGNOSIS — G4733 Obstructive sleep apnea (adult) (pediatric): Secondary | ICD-10-CM | POA: Diagnosis not present

## 2023-11-08 MED ORDER — CIPROFLOXACIN HCL 250 MG PO TABS: 250.0000 mg | ORAL_TABLET | Freq: Two times a day (BID) | ORAL | 0 refills | Status: AC

## 2023-11-08 NOTE — Telephone Encounter (Signed)
She has an appointment with you on 11/15/23 , is that okay or does she need to come in sooner?

## 2023-11-08 NOTE — Telephone Encounter (Signed)
Pt called and lvm to return call 

## 2023-11-08 NOTE — Telephone Encounter (Signed)
Copied from CRM [id number]. Topic: Clinical - Medication Question >> Nov 08, 2023 10:18 AM Ernst Spell wrote: Reason for CRM: Ms.Decola called and stated that she was seen with Vernona Rieger and treated for a UTI on 12/20. She was given nitrofurantoin, macrocrystal-monohydrate, (MACROBID) 100 MG initially, but on the 23rd was advised to stop taking it and begin taking Bactrim. However, while in [place], she had to go to the ED because she was having sxs of vomiting, stomach pain, and her magnesium levels were low. Per [facility], the sxs was caused from Bactrim. She then stated that she finished taking it but believes she still has a UTI and requested for Dr. Abner Greenspan to send her another medication to treat her sxs. Additionally, she stated that she felt the first medication was actually working for her. Patient asked for pcp or nurse to call her at [phone].

## 2023-11-08 NOTE — Transitions of Care (Post Inpatient/ED Visit) (Signed)
   11/08/2023  Name: Tami [name] MRN: [id number] DOB: [birth year]  Today's TOC FU Call Status: Today's TOC FU Call Status:: Unsuccessful Call (1st Attempt) Unsuccessful Call (1st Attempt) Date: 11/08/23  Attempted to reach the patient regarding the most recent Inpatient/ED visit. Pt has ED f/u appointment already scheduled with PCP.  Follow Up Plan: No further outreach attempts will be made at this time. We have been unable to contact the patient.  Signature Rhonin Trott, [facility]

## 2023-11-12 ENCOUNTER — Other Ambulatory Visit: Payer: Self-pay | Admitting: *Deleted

## 2023-11-12 ENCOUNTER — Inpatient Hospital Stay: Payer: 59

## 2023-11-12 ENCOUNTER — Inpatient Hospital Stay: Payer: 59 | Attending: Hematology & Oncology

## 2023-11-12 DIAGNOSIS — R002 Palpitations: Secondary | ICD-10-CM

## 2023-11-12 DIAGNOSIS — D518 Other vitamin B12 deficiency anemias: Secondary | ICD-10-CM

## 2023-11-12 DIAGNOSIS — Z5112 Encounter for antineoplastic immunotherapy: Secondary | ICD-10-CM | POA: Diagnosis not present

## 2023-11-12 DIAGNOSIS — C9002 Multiple myeloma in relapse: Secondary | ICD-10-CM

## 2023-11-12 DIAGNOSIS — D801 Nonfamilial hypogammaglobulinemia: Secondary | ICD-10-CM

## 2023-11-12 DIAGNOSIS — K219 Gastro-esophageal reflux disease without esophagitis: Secondary | ICD-10-CM

## 2023-11-12 DIAGNOSIS — C9000 Multiple myeloma not having achieved remission: Secondary | ICD-10-CM

## 2023-11-12 DIAGNOSIS — I5041 Acute combined systolic (congestive) and diastolic (congestive) heart failure: Secondary | ICD-10-CM

## 2023-11-12 DIAGNOSIS — R197 Diarrhea, unspecified: Secondary | ICD-10-CM

## 2023-11-12 DIAGNOSIS — D500 Iron deficiency anemia secondary to blood loss (chronic): Secondary | ICD-10-CM

## 2023-11-12 DIAGNOSIS — E860 Dehydration: Secondary | ICD-10-CM

## 2023-11-12 DIAGNOSIS — Z5111 Encounter for antineoplastic chemotherapy: Secondary | ICD-10-CM | POA: Insufficient documentation

## 2023-11-12 DIAGNOSIS — Z79899 Other long term (current) drug therapy: Secondary | ICD-10-CM | POA: Insufficient documentation

## 2023-11-12 LAB — MAGNESIUM: Magnesium: 1.9 mg/dL (ref 1.7–2.4)

## 2023-11-12 LAB — CBC WITH DIFFERENTIAL (CANCER CENTER ONLY)
Abs Immature Granulocytes: 0.01 10*3/uL (ref 0.00–0.07)
Basophils Absolute: 0 10*3/uL (ref 0.0–0.1)
Basophils Relative: 1 %
Eosinophils Absolute: 0.1 10*3/uL (ref 0.0–0.5)
Eosinophils Relative: 3 %
HCT: 35.8 % — ABNORMAL LOW (ref 36.0–46.0)
Hemoglobin: 11.8 g/dL — ABNORMAL LOW (ref 12.0–15.0)
Immature Granulocytes: 0 %
Lymphocytes Relative: 19 %
Lymphs Abs: 0.8 10*3/uL (ref 0.7–4.0)
MCH: 30.3 pg (ref 26.0–34.0)
MCHC: 33 g/dL (ref 30.0–36.0)
MCV: 92 fL (ref 80.0–100.0)
Monocytes Absolute: 0.3 10*3/uL (ref 0.1–1.0)
Monocytes Relative: 8 %
Neutro Abs: 3 10*3/uL (ref 1.7–7.7)
Neutrophils Relative %: 69 %
Platelet Count: 130 10*3/uL — ABNORMAL LOW (ref 150–400)
RBC: 3.89 MIL/uL (ref 3.87–5.11)
RDW: 13.5 % (ref 11.5–15.5)
WBC Count: 4.2 10*3/uL (ref 4.0–10.5)
nRBC: 0 % (ref 0.0–0.2)

## 2023-11-12 LAB — CMP (CANCER CENTER ONLY)
ALT: 13 U/L (ref 0–44)
AST: 20 U/L (ref 15–41)
Albumin: 4 g/dL (ref 3.5–5.0)
Alkaline Phosphatase: 48 U/L (ref 38–126)
Anion gap: 8 (ref 5–15)
BUN: 18 mg/dL (ref 8–23)
CO2: 26 mmol/L (ref 22–32)
Calcium: 9.1 mg/dL (ref 8.9–10.3)
Chloride: 108 mmol/L (ref 98–111)
Creatinine: 0.67 mg/dL (ref 0.44–1.00)
GFR, Estimated: >60 mL/min (ref >60)
Glucose, Bld: 128 mg/dL — ABNORMAL HIGH (ref 70–99)
Potassium: 3.7 mmol/L (ref 3.5–5.1)
Sodium: 142 mmol/L (ref 135–145)
Total Bilirubin: 0.6 mg/dL (ref 0.0–1.2)
Total Protein: 6.8 g/dL (ref 6.5–8.1)

## 2023-11-12 MED ORDER — SODIUM CHLORIDE 0.9% FLUSH
10.0000 mL | INTRAVENOUS | Status: DC | PRN
Start: 2023-11-12 — End: 2023-11-12
  Administered 2023-11-12: 10 mL

## 2023-11-12 MED ORDER — SODIUM CHLORIDE 0.9 % IV SOLN: Freq: Once | INTRAVENOUS | Status: AC

## 2023-11-12 MED ORDER — SODIUM CHLORIDE 0.9 % IV SOLN
300.0000 mg/m2 | Freq: Once | INTRAVENOUS | Status: AC
  Administered 2023-11-12: 600 mg via INTRAVENOUS
  Filled 2023-11-12: qty 30

## 2023-11-12 MED ORDER — SODIUM CHLORIDE 0.9 % IV SOLN
20.0000 mg | Freq: Once | INTRAVENOUS | Status: AC
  Administered 2023-11-12: 19.9992 mg via INTRAVENOUS
  Filled 2023-11-12: qty 20

## 2023-11-12 MED ORDER — FAMOTIDINE IN NACL 20-0.9 MG/50ML-% IV SOLN
20.0000 mg | Freq: Once | INTRAVENOUS | Status: AC
  Administered 2023-11-12: 20 mg via INTRAVENOUS
  Filled 2023-11-12: qty 50

## 2023-11-12 MED ORDER — DIPHENHYDRAMINE HCL 50 MG/ML IJ SOLN
50.0000 mg | Freq: Once | INTRAMUSCULAR | Status: AC
  Administered 2023-11-12: 50 mg via INTRAVENOUS
  Filled 2023-11-12: qty 1

## 2023-11-12 MED ORDER — DEXTROSE 5 % IV SOLN
56.0000 mg/m2 | Freq: Once | INTRAVENOUS | Status: AC
  Administered 2023-11-12: 110 mg via INTRAVENOUS
  Filled 2023-11-12: qty 30

## 2023-11-12 MED ORDER — HEPARIN SOD (PORK) LOCK FLUSH 100 UNIT/ML IV SOLN
500.0000 [IU] | Freq: Once | INTRAVENOUS | Status: AC | PRN
  Administered 2023-11-12: 500 [IU]

## 2023-11-12 MED ORDER — ACETAMINOPHEN 325 MG PO TABS
650.0000 mg | ORAL_TABLET | Freq: Once | ORAL | Status: AC
  Administered 2023-11-12: 650 mg via ORAL
  Filled 2023-11-12: qty 2

## 2023-11-12 MED ORDER — SODIUM CHLORIDE 0.9 % IV SOLN
10.0000 mg/kg | Freq: Once | INTRAVENOUS | Status: AC
  Administered 2023-11-12: 900 mg via INTRAVENOUS
  Filled 2023-11-12: qty 25

## 2023-11-12 NOTE — Patient Instructions (Signed)

## 2023-11-12 NOTE — Patient Instructions (Signed)
 [facility] - A DEPT OF [facility]  Discharge Instructions: Thank you for choosing [facility] to provide your oncology and hematology care.   If you have a lab appointment with the Cancer Center, please go directly to the Cancer Center and check in at the registration area.  Wear comfortable clothing and clothing appropriate for easy access to any Portacath or PICC line.   We strive to give you quality time with your provider. You may need to reschedule your appointment if you arrive late (15 or more minutes).  Arriving late affects you and other patients whose appointments are after yours.  Also, if you miss three or more appointments without notifying the office, you may be dismissed from the clinic at the provider's discretion.      For prescription refill requests, have your pharmacy contact our office and allow 72 hours for refills to be completed.    Today you received the following chemotherapy and/or immunotherapy agents Cytoxan , Sarclisa , Kyprolis .   To help prevent nausea and vomiting after your treatment, we encourage you to take your nausea medication as directed.  BELOW ARE SYMPTOMS THAT SHOULD BE REPORTED IMMEDIATELY: *FEVER GREATER THAN 100.4 F (38 C) OR HIGHER *CHILLS OR SWEATING *NAUSEA AND VOMITING THAT IS NOT CONTROLLED WITH YOUR NAUSEA MEDICATION *UNUSUAL SHORTNESS OF BREATH *UNUSUAL BRUISING OR BLEEDING *URINARY PROBLEMS (pain or burning when urinating, or frequent urination) *BOWEL PROBLEMS (unusual diarrhea, constipation, pain near the anus) TENDERNESS IN MOUTH AND THROAT WITH OR WITHOUT PRESENCE OF ULCERS (sore throat, sores in mouth, or a toothache) UNUSUAL RASH, SWELLING OR PAIN  UNUSUAL VAGINAL DISCHARGE OR ITCHING   Items with * indicate a potential emergency and should be followed up as soon as possible or go to the Emergency Department if any problems should occur.  Please show the CHEMOTHERAPY ALERT CARD or  IMMUNOTHERAPY ALERT CARD at check-in to the Emergency Department and triage nurse. Should you have questions after your visit or need to cancel or reschedule your appointment, please contact [facility] - A DEPT OF [facility]  [phone] and follow the prompts.  Office hours are 8:00 a.m. to 4:30 p.m. Monday - Friday. Please note that voicemails left after 4:00 p.m. may not be returned until the following business day.  We are closed weekends and major holidays. You have access to a nurse at all times for urgent questions. Please call the main number to the clinic [phone] and follow the prompts.  For any non-urgent questions, you may also contact your provider using MyChart. We now offer e-Visits for anyone 21 and older to request care online for non-urgent symptoms. For details visit [email or web address].   Also download the MyChart app! Go to the app store, search MyChart, open the app, select [place], and log in with your MyChart username and password.

## 2023-11-15 ENCOUNTER — Telehealth: Payer: Self-pay

## 2023-11-15 ENCOUNTER — Ambulatory Visit: Payer: 59 | Admitting: Family Medicine

## 2023-11-15 DIAGNOSIS — R29898 Other symptoms and signs involving the musculoskeletal system: Secondary | ICD-10-CM

## 2023-11-15 NOTE — Telephone Encounter (Signed)
 Copied from CRM [id number]. Topic: Clinical - Medical Advice >> Nov 15, 2023 11:44 AM [name] wrote: Reason for CRM: Patient called in to speak with nurse [name] regarding her health issues / would like to be reached at [phone]

## 2023-11-15 NOTE — Telephone Encounter (Signed)
 Spoke with patient. She wanted to know the status of her Bedside commode.

## 2023-11-16 ENCOUNTER — Other Ambulatory Visit: Payer: Self-pay

## 2023-11-16 ENCOUNTER — Emergency Department (HOSPITAL_BASED_OUTPATIENT_CLINIC_OR_DEPARTMENT_OTHER)
Admission: EM | Admit: 2023-11-16 | Discharge: 2023-11-16 | Disposition: A | Discharge status: Home / Self Care | Payer: 59 | Attending: Emergency Medicine | Admitting: Emergency Medicine

## 2023-11-16 ENCOUNTER — Encounter (HOSPITAL_BASED_OUTPATIENT_CLINIC_OR_DEPARTMENT_OTHER): Payer: Self-pay | Admitting: Urology

## 2023-11-16 DIAGNOSIS — Z7982 Long term (current) use of aspirin: Secondary | ICD-10-CM | POA: Insufficient documentation

## 2023-11-16 DIAGNOSIS — I509 Heart failure, unspecified: Secondary | ICD-10-CM | POA: Diagnosis not present

## 2023-11-16 DIAGNOSIS — R197 Diarrhea, unspecified: Secondary | ICD-10-CM | POA: Diagnosis not present

## 2023-11-16 DIAGNOSIS — E860 Dehydration: Secondary | ICD-10-CM | POA: Insufficient documentation

## 2023-11-16 DIAGNOSIS — Z8673 Personal history of transient ischemic attack (TIA), and cerebral infarction without residual deficits: Secondary | ICD-10-CM | POA: Insufficient documentation

## 2023-11-16 LAB — CBC WITH DIFFERENTIAL/PLATELET
Abs Immature Granulocytes: 0.01 10*3/uL (ref 0.00–0.07)
Basophils Absolute: 0 10*3/uL (ref 0.0–0.1)
Basophils Relative: 0 %
Eosinophils Absolute: 0.1 10*3/uL (ref 0.0–0.5)
Eosinophils Relative: 2 %
HCT: 37.3 % (ref 36.0–46.0)
Hemoglobin: 12.4 g/dL (ref 12.0–15.0)
Immature Granulocytes: 0 %
Lymphocytes Relative: 30 %
Lymphs Abs: 1.4 10*3/uL (ref 0.7–4.0)
MCH: 30 pg (ref 26.0–34.0)
MCHC: 33.2 g/dL (ref 30.0–36.0)
MCV: 90.1 fL (ref 80.0–100.0)
Monocytes Absolute: 0.3 10*3/uL (ref 0.1–1.0)
Monocytes Relative: 7 %
Neutro Abs: 2.8 10*3/uL (ref 1.7–7.7)
Neutrophils Relative %: 61 %
Platelets: 72 10*3/uL — ABNORMAL LOW (ref 150–400)
RBC: 4.14 MIL/uL (ref 3.87–5.11)
RDW: 13.4 % (ref 11.5–15.5)
Smear Review: NORMAL
WBC: 4.6 10*3/uL (ref 4.0–10.5)
nRBC: 0 % (ref 0.0–0.2)

## 2023-11-16 LAB — COMPREHENSIVE METABOLIC PANEL
ALT: 19 U/L (ref 0–44)
AST: 31 U/L (ref 15–41)
Albumin: 3.7 g/dL (ref 3.5–5.0)
Alkaline Phosphatase: 46 U/L (ref 38–126)
Anion gap: 7 (ref 5–15)
BUN: 21 mg/dL (ref 8–23)
CO2: 24 mmol/L (ref 22–32)
Calcium: 9.2 mg/dL (ref 8.9–10.3)
Chloride: 107 mmol/L (ref 98–111)
Creatinine, Ser: 0.75 mg/dL (ref 0.44–1.00)
GFR, Estimated: >60 mL/min (ref >60)
Glucose, Bld: 112 mg/dL — ABNORMAL HIGH (ref 70–99)
Potassium: 4.5 mmol/L (ref 3.5–5.1)
Sodium: 138 mmol/L (ref 135–145)
Total Bilirubin: 0.7 mg/dL (ref 0.0–1.2)
Total Protein: 7.3 g/dL (ref 6.5–8.1)

## 2023-11-16 LAB — URINALYSIS, ROUTINE W REFLEX MICROSCOPIC
Bilirubin Urine: NEGATIVE
Glucose, UA: NEGATIVE mg/dL
Hgb urine dipstick: NEGATIVE
Ketones, ur: NEGATIVE mg/dL
Nitrite: NEGATIVE
Protein, ur: 30 mg/dL — AB
Specific Gravity, Urine: >=1.03 (ref 1.005–1.030)
pH: 6 (ref 5.0–8.0)

## 2023-11-16 LAB — URINALYSIS, MICROSCOPIC (REFLEX)

## 2023-11-16 LAB — LIPASE, BLOOD: Lipase: 32 U/L (ref 11–51)

## 2023-11-16 MED ORDER — SODIUM CHLORIDE 0.9 % IV BOLUS
1000.0000 mL | Freq: Once | INTRAVENOUS | Status: AC
  Administered 2023-11-16: 1000 mL via INTRAVENOUS

## 2023-11-16 NOTE — Telephone Encounter (Signed)
 Called and spoke with representative from [employer] and they are going to fax a blank form to be filled out again. Will give to Dr. Abner Greenspan to re sign Thursday (11/18/2023)

## 2023-11-16 NOTE — Addendum Note (Signed)
 Addended by: Thelma Barge D on: 11/16/2023 09:55 AM   Modules accepted: Orders

## 2023-11-16 NOTE — ED Notes (Signed)
 Discharge paperwork reviewed entirely with patient, including follow up care. Pain was under control. No prescriptions were called in, but all questions were addressed.  Pt verbalized understanding as well as all parties involved. No questions or concerns voiced at the time of discharge. No acute distress noted.   Pt ambulated out to PVA without incident or assistance.  Pt advised they will notify their PCP immediately.

## 2023-11-16 NOTE — Telephone Encounter (Signed)
 Order placed and pt notified.  Community message sent to [employer].

## 2023-11-16 NOTE — ED Triage Notes (Signed)
 Pt states mood swings, was on antibiotics for UTI and finished them but still having symptoms, also treated for yeast infection   States diarrhea as well, states liquid stool that started 2-3 days ago

## 2023-11-16 NOTE — ED Provider Notes (Signed)
 [place] [facility] AT [facility] [place] Provider Note   CSN: [id number] Arrival date & time: 11/16/23  1745     History  For dehydration.  Tami [name] is a 68 y.o. female.  Patient concern for dehydration.  Has ongoing diarrhea which is chronic.  She is not had any nausea or vomiting or abdominal pain.  Nothing makes it worse or better.  She was just on some antibiotics for UTI but states that diarrhea is worse than normal.  She denies any chest pain shortness of breath weakness numbness tingling.  No fever or chills.  No chest pain shortness of breath.  The history is provided by the patient.       Home Medications Prior to Admission medications   Medication Sig Start Date End Date Taking? Authorizing Provider  acetaminophen  (TYLENOL ) 500 MG tablet Take 500 mg by mouth every 6 (six) hours as needed.    [provider]  acyclovir  (ZOVIRAX ) 400 MG tablet Take 1 tablet (400 mg total) by mouth 2 (two) times daily. 04/22/23   Timmy Maude SAUNDERS, MD  albuterol  (PROVENTIL ) (2.5 MG/3ML) 0.083% nebulizer solution Take 3 mLs (2.5 mg total) by nebulization every 6 (six) hours as needed for wheezing or shortness of breath. 12/15/22   Domenica Harlene DELENA, MD  albuterol  (VENTOLIN  HFA) 108 (90 Base) MCG/ACT inhaler Inhale 2 puffs into the lungs every 6 (six) hours as needed for wheezing or shortness of breath. 12/07/22   Domenica Harlene DELENA, MD  ALPRAZolam  (XANAX ) 0.25 MG tablet Take 1-2 tablets (0.25-0.5 mg total) by mouth 3 (three) times daily as needed for anxiety. 06/14/23   Domenica Harlene DELENA, MD  amLODipine  (NORVASC ) 5 MG tablet Take 5 mg by mouth daily. 09/14/23   [provider]  aspirin  EC 81 MG tablet Take 81 mg by mouth daily. Swallow whole.    [provider]  budesonide  (PULMICORT ) 0.5 MG/2ML nebulizer solution Take 2 mLs (0.5 mg total) by nebulization 2 (two) times daily as needed (cough, wheezing or shortness of breath). 12/31/22   Kara Dorn NOVAK, MD   celecoxib  (CELEBREX ) 100 MG capsule Take 100 mg by mouth 2 (two) times daily. 06/07/23   [provider]  cetirizine  (ZYRTEC ) 10 MG tablet Take 10 mg by mouth at bedtime.    [provider]  Cholecalciferol (VITAMIN D3) 50 MCG (2000 UT) TABS Take 2,000 Units by mouth in the morning.    [provider]  cholestyramine  light (PREVALITE ) 4 g packet Take 4 g by mouth 2 (two) times daily.    [provider]  diclofenac Sodium (VOLTAREN) 1 % GEL Apply 2 g topically as directed. 08/19/23 11/17/23  [provider]  dicyclomine  (BENTYL ) 10 MG capsule Take 1 capsule (10 mg total) by mouth 4 (four) times daily -  before meals and at bedtime. cramping 09/09/23 12/08/23  Luiz Channel, MD  diphenoxylate -atropine  (LOMOTIL ) 2.5-0.025 MG tablet Take 1 tablet by mouth 2 (two) times daily. 05/19/23   Charlanne Groom, MD  famotidine  (PEPCID ) 20 MG tablet Take 1 tablet (20 mg total) by mouth at bedtime. 09/16/23   Domenica Harlene DELENA, MD  fluconazole  (DIFLUCAN ) 150 MG tablet Take 1 tablet today as directed; repeat after 72 hours 10/29/23   Jason Leita Repine, FNP  fluticasone  (FLONASE ) 50 MCG/ACT nasal spray Place 2 sprays into both nostrils daily. 01/07/20   Joy, Shawn C, PA-C  furosemide  (LASIX ) 20 MG tablet Take 20 mg by mouth daily as needed (fluid retention (  feet swelling)). 07/03/20   [provider]  HYDROcodone  bit-homatropine (HYDROMET) 5-1.5 MG/5ML syrup Take 5 mLs by mouth every 8 (eight) hours as needed for cough. 09/20/23   Domenica Harlene LABOR, MD  hydrOXYzine  (ATARAX ) 10 MG tablet TAKE ONE (1) TABLET BY MOUTH 3 TIMES DAILY AS NEEDED FOR ITCHING 03/03/23   Domenica Harlene LABOR, MD  hyoscyamine  (LEVSIN  SL) 0.125 MG SL tablet Place 1 tablet (0.125 mg total) under the tongue every 4 (four) hours as needed. 04/27/23   Domenica Harlene LABOR, MD  lidocaine -prilocaine  (EMLA ) cream Apply 1 Application topically as needed. 07/01/23   Timmy Maude SAUNDERS, MD  loperamide  (IMODIUM ) 2 MG  capsule Take 4 mg by mouth in the morning and at bedtime.    [provider]  losartan  (COZAAR ) 50 MG tablet Take 50 mg by mouth in the morning and at bedtime. 04/14/21   [provider]  Magnesium  Glycinate 100 MG CAPS Take 200 mg by mouth at bedtime. 09/21/22   [provider]  meclizine  (ANTIVERT ) 12.5 MG tablet Take 12.5 mg by mouth 2 (two) times daily as needed for dizziness or nausea. 01/28/22   [provider]  metoprolol  succinate (TOPROL -XL) 50 MG 24 hr tablet Take 1 tablet (50 mg total) by mouth 2 (two) times daily. Patient taking differently: Take 50 mg by mouth daily. 02/23/23   Domenica Harlene LABOR, MD  montelukast  (SINGULAIR ) 10 MG tablet Take 1 tablet (10 mg total) by mouth at bedtime. Please start taking this on September 4. 07/01/23   Timmy Maude SAUNDERS, MD  Multiple Vitamins-Minerals (CENTRUM SILVER  WOMEN 50+ PO) Take 1 tablet by mouth daily at 6 (six) AM.    [provider]  neomycin -polymyxin-hydrocortisone (CORTISPORIN) OTIC solution Place 4 drops into both ears 4 (four) times daily. 07/30/23   Timmy Maude SAUNDERS, MD  nortriptyline  (PAMELOR ) 10 MG capsule Take 1 capsule (10 mg total) by mouth at bedtime. 06/14/23   Domenica Harlene LABOR, MD  omeprazole  (PRILOSEC) 20 MG capsule SMARTSIG:1.0 Capsule(s) By Mouth Daily 08/19/23   [provider]  ondansetron  (ZOFRAN ) 8 MG tablet Take 1 tablet (8 mg total) by mouth every 8 (eight) hours as needed for nausea or vomiting. 07/14/23   Timmy Maude SAUNDERS, MD  oxyCODONE -acetaminophen  (PERCOCET) 5-325 MG tablet Take 2 tablets by mouth every 6 (six) hours as needed. 08/07/23   Patt Alm Macho, MD  oxyCODONE -acetaminophen  (PERCOCET/ROXICET) 5-325 MG tablet Take 1 tablet by mouth every 6 (six) hours as needed for severe pain. 11/07/21   [provider]  pantoprazole  (PROTONIX ) 40 MG tablet Take 1 tablet (40 mg total) by mouth daily. 09/03/23   O'Sullivan, Melissa, NP  potassium chloride  SA (KLOR-CON  M) 20  MEQ tablet TAKE 1 TABLET BY MOUTH EVERY MONDAY, WEDNESDAY, AND FRIDAY. 10/25/23   Domenica Harlene LABOR, MD  prochlorperazine  (COMPAZINE ) 10 MG tablet Take 0.5 tablets (5 mg total) by mouth 2 (two) times daily as needed for nausea or vomiting. 03/16/23   Domenica Harlene LABOR, MD  Respiratory Therapy Supplies (PILLOW MASK/ADULT) MISC . Provide PAP supplies (expires 12 months from 09/21/2023) . 09/29/23   [provider]  Saccharomyces boulardii (FLORASTOR PO) Take 500 mg by mouth daily at 6 (six) AM.    [provider]  scopolamine  (TRANSDERM-SCOP) 1 MG/3DAYS Place 1 patch (1.5 mg total) onto the skin every 3 (three) days. 07/01/23   Timmy Maude SAUNDERS, MD  sertraline  (ZOLOFT ) 50 MG tablet Take 1 tablet (50 mg total) by mouth daily. 06/14/23  Domenica Harlene LABOR, MD  spironolactone  (ALDACTONE ) 25 MG tablet Take 25 mg by mouth daily.    [provider]  sulfamethoxazole -trimethoprim  (BACTRIM  DS) 800-160 MG tablet Take 1 tablet by mouth 2 (two) times daily. 11/01/23   Jason Leita Repine, FNP      Allergies    Benazepril , Ondansetron  hcl, Codeine, and Morphine    Review of Systems   Review of Systems  Physical Exam Updated Vital Signs BP (!) 143/92 (BP Location: Left Arm)   Pulse 88   Temp 98.2 F (36.8 C) (Oral)   Resp 18   Ht 5' 6 (1.676 m)   Wt 87.1 kg   SpO2 97%   BMI 30.99 kg/m  Physical Exam Vitals and nursing note reviewed.  Constitutional:      General: She is not in acute distress.    Appearance: She is well-developed.  HENT:     Head: Normocephalic and atraumatic.     Nose: Nose normal.     Mouth/Throat:     Mouth: Mucous membranes are moist.  Eyes:     Extraocular Movements: Extraocular movements intact.     Conjunctiva/sclera: Conjunctivae normal.     Pupils: Pupils are equal, round, and reactive to light.  Cardiovascular:     Rate and Rhythm: Normal rate and regular rhythm.     Pulses: Normal pulses.     Heart sounds: Normal heart sounds. No murmur  heard. Pulmonary:     Effort: Pulmonary effort is normal. No respiratory distress.     Breath sounds: Normal breath sounds.  Abdominal:     Palpations: Abdomen is soft.     Tenderness: There is no abdominal tenderness.  Musculoskeletal:        General: No swelling.     Cervical back: Neck supple.  Skin:    General: Skin is warm and dry.     Capillary Refill: Capillary refill takes less than 2 seconds.  Neurological:     Mental Status: She is alert.  Psychiatric:        Mood and Affect: Mood normal.     ED Results / Procedures / Treatments   Labs (all labs ordered are listed, but only abnormal results are displayed) Labs Reviewed  CBC WITH DIFFERENTIAL/PLATELET - Abnormal; Notable for the following components:      Result Value   Platelets 72 (*)    All other components within normal limits  COMPREHENSIVE METABOLIC PANEL - Abnormal; Notable for the following components:   Glucose, Bld 112 (*)    All other components within normal limits  URINALYSIS, ROUTINE W REFLEX MICROSCOPIC - Abnormal; Notable for the following components:   APPearance HAZY (*)    Protein, ur 30 (*)    Leukocytes,Ua SMALL (*)    All other components within normal limits  URINALYSIS, MICROSCOPIC (REFLEX) - Abnormal; Notable for the following components:   Bacteria, UA FEW (*)    All other components within normal limits  LIPASE, BLOOD    EKG None  Radiology No results found.  Procedures Procedures    Medications Ordered in ED Medications  sodium chloride  0.9 % bolus 1,000 mL (1,000 mLs Intravenous New Bag/Given 11/16/23 1945)    ED Course/ Medical Decision Making/ A&P                                 Medical Decision Making Amount and/or Complexity of Data Reviewed Labs: ordered.  Tami [name] is here with diarrhea.  History of CHF chronic diarrhea stroke C. difficile.  Overall patient appears well.  Patient given IV fluids and will check labs for dehydration.  Will see if she can  give a stool sample.  Overall she has had multiple visits in the past for diarrhea.  Does not seem like she has had positive pathogen panels in the past.  She does not have any abdominal pain.  She is well-appearing.  Overall lab work is unremarkable.  No significant anemia electrolyte abnormality kidney injury or leukocytosis.  Did not have any diarrhea while in the ED.  Will have her follow-up with primary care doctor to give a stool sample if diarrhea is not improving.  No signs of dehydration and per my review interpretation labs things appear well.  She has no abdominal pain and no concern for intra-abdominal process.  Discharged in good condition.  This chart was dictated using voice recognition software.  Despite best efforts to proofread,  errors can occur which can change the documentation meaning.         Final Clinical Impression(s) / ED Diagnoses Final diagnoses:  Diarrhea, unspecified type    Rx / DC Orders ED Discharge Orders     None         Ruthe Cornet, DO 11/16/23 2034

## 2023-11-16 NOTE — Telephone Encounter (Signed)
 Copied from CRM [id number]. Topic: General - Other >> Nov 16, 2023 11:38 AM Cherylynn B wrote: Reason for CRM: Corean from [facility] called in stating that patients DME order was sent in incomplete. States that they need additional questions filled out for numbers 21 and 30. Callback [phone] and Fax #[phone]

## 2023-11-17 ENCOUNTER — Telehealth: Payer: Self-pay

## 2023-11-17 DIAGNOSIS — R197 Diarrhea, unspecified: Secondary | ICD-10-CM

## 2023-11-17 NOTE — Assessment & Plan Note (Signed)
 Well controlled, no changes to meds. Encouraged heart healthy diet such as the DASH diet and exercise as tolerated.

## 2023-11-17 NOTE — Assessment & Plan Note (Signed)
 [id number] acceptable, minimize simple carbs. Increase exercise as tolerated.

## 2023-11-17 NOTE — Telephone Encounter (Signed)
 Copied from CRM [id number]. Topic: Clinical - Request for Lab/Test Order >> Nov 17, 2023  8:50 AM Corean SAUNDERS wrote: Reason for CRM:  Patient went to emergency room last night and the emergency room wants her to have a stool test done by her primary care doctor, patient is inquiring on if there is a provider who can in put the order today so that she can bring in the stool sample for her appointment with Dr. Carleton tomorrow. Please confirm with patient if this is something that can be done for her.

## 2023-11-17 NOTE — Telephone Encounter (Signed)
 Order placed for c-diff and patient will be by today to pickup.

## 2023-11-17 NOTE — Assessment & Plan Note (Addendum)
 Seen in ED again with flare in diarrhea, repeat stool test for CDiff is pending, patient would benefit from a bedside commode due to history of weakness, falls, diarrhea, stroke, recurrent dizziness and more. Will prescribe. She is unable to ambulate safely and any more than 10 feet especially during a diarrhea flare.

## 2023-11-17 NOTE — Assessment & Plan Note (Signed)
 Mild asympomatic

## 2023-11-17 NOTE — Assessment & Plan Note (Signed)
 Supplement and monitor

## 2023-11-17 NOTE — Addendum Note (Signed)
 Addended by: Thelma Barge D on: 11/17/2023 01:32 PM   Modules accepted: Orders

## 2023-11-17 NOTE — Assessment & Plan Note (Signed)
 Follows with oncology

## 2023-11-17 NOTE — Assessment & Plan Note (Signed)
 Encourage heart healthy diet such as MIND or DASH diet, increase exercise, avoid trans fats, simple carbohydrates and processed foods, consider a krill or fish or flaxseed oil cap daily.

## 2023-11-18 ENCOUNTER — Ambulatory Visit: Payer: 59 | Admitting: Family Medicine

## 2023-11-18 ENCOUNTER — Ambulatory Visit (INDEPENDENT_AMBULATORY_CARE_PROVIDER_SITE_OTHER): Payer: 59 | Admitting: Family Medicine

## 2023-11-18 ENCOUNTER — Encounter: Payer: Self-pay | Admitting: Family Medicine

## 2023-11-18 VITALS — BP 138/86 | HR 85 | Temp 98.0°F | Resp 18 | Ht 66.0 in | Wt 191.2 lb

## 2023-11-18 DIAGNOSIS — E785 Hyperlipidemia, unspecified: Secondary | ICD-10-CM

## 2023-11-18 DIAGNOSIS — R350 Frequency of micturition: Secondary | ICD-10-CM | POA: Diagnosis not present

## 2023-11-18 DIAGNOSIS — R739 Hyperglycemia, unspecified: Secondary | ICD-10-CM | POA: Diagnosis not present

## 2023-11-18 DIAGNOSIS — I10 Essential (primary) hypertension: Secondary | ICD-10-CM | POA: Diagnosis not present

## 2023-11-18 DIAGNOSIS — I5042 Chronic combined systolic (congestive) and diastolic (congestive) heart failure: Secondary | ICD-10-CM

## 2023-11-18 DIAGNOSIS — R197 Diarrhea, unspecified: Secondary | ICD-10-CM

## 2023-11-18 LAB — URINALYSIS
Bilirubin Urine: NEGATIVE
Hgb urine dipstick: NEGATIVE
Ketones, ur: NEGATIVE
Leukocytes,Ua: NEGATIVE
Nitrite: NEGATIVE
Specific Gravity, Urine: 1.02 (ref 1.000–1.030)
Total Protein, Urine: NEGATIVE
Urine Glucose: NEGATIVE
Urobilinogen, UA: 0.2 (ref 0.0–1.0)
pH: 6 (ref 5.0–8.0)

## 2023-11-18 MED ORDER — METRONIDAZOLE 500 MG PO TABS: 500.0000 mg | ORAL_TABLET | Freq: Three times a day (TID) | ORAL | 0 refills | Status: AC

## 2023-11-18 NOTE — Progress Notes (Addendum)
 Subjective:    [name], female    DOB: 1956/06/12, 68 y.o.   MRN: [id number]  Chief Complaint  Patient presents with   Follow-up    ED follow up    HPI Discussed the use of AI scribe software for clinical note transcription with the patient, who gave verbal consent to proceed.  History of Present Illness   The patient, with a history of recurrent UTIs and yeast infections, presents with multiple health concerns. She recently experienced a severe bout of diarrhea that led to hospitalization and required fluid replacement. The diarrhea started after the patient was prescribed antibiotics for a UTI. The patient also reports feeling lightheaded and weak, but these symptoms have improved slightly.  The patient also mentions a yeast infection and a subsequent bacterial overgrowth, which she believes is causing a fishy smell and increased vaginal discharge. She has been prescribed metronidazole  for this issue.  In addition to these health concerns, the patient is struggling with personal care and home upkeep due to living alone. She previously had personal care services, but these have been discontinued. The patient is seeking assistance to reestablish these services.  The patient also mentions a future colonoscopy appointment, indicating ongoing gastrointestinal concerns.        Past Medical History:  Diagnosis Date   Abnormal SPEP 07/26/2016   Arthritis    knees, hands   Back pain 07/14/2016   Bowel obstruction (HCC) 11/2014   C. 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   Multiple myeloma (HCC) 07/20/2018   Multiple myeloma not having achieved remission (HCC) 07/20/2018   Myalgia 12/31/2016   Obstructive sleep apnea 03/31/2015   Presence of permanent cardiac pacemaker    SOB (shortness of breath) 04/09/2016   Stroke (HCC)    TIAs   UTI (urinary tract infection)     Past Surgical History:  Procedure Laterality Date   ABDOMINAL HYSTERECTOMY     menorraghia, 2006, total   BIOPSY  09/01/2022   Procedure: BIOPSY;  Surgeon: Charlanne Groom, MD;  Location: [facility];  Service: Gastroenterology;;   CHOLECYSTECTOMY     COLONOSCOPY  2018   [facility] per patient   COLONOSCOPY WITH PROPOFOL  N/A 09/01/2022   Procedure: COLONOSCOPY WITH PROPOFOL ;  Surgeon: Charlanne Groom, MD;  Location: [facility];  Service: Gastroenterology;  Laterality: N/A;   ESOPHAGOGASTRODUODENOSCOPY  2018   [facility]    ESOPHAGOGASTRODUODENOSCOPY (EGD) WITH PROPOFOL  N/A 09/01/2022   Procedure: ESOPHAGOGASTRODUODENOSCOPY (EGD) WITH PROPOFOL ;  Surgeon: Charlanne Groom, MD;  Location: [facility];  Service: Gastroenterology;  Laterality: N/A;   IR IMAGING GUIDED PORT INSERTION  06/29/2023   KNEE SURGERY     right, repair torn torn cartialage   PACEMAKER INSERTION  08/2014   changed last in 2023 per pt at [facility]   TONSILLECTOMY     TUBAL LIGATION      Family History  Problem Relation Age of Onset   Diabetes Mother    Hypertension Mother  Heart disease Mother        s/p 1 stent   Hyperlipidemia Mother    Arthritis Mother    Cancer Father        COLON   Colon cancer Father 36   Irritable bowel syndrome Sister    Hyperlipidemia Daughter    Hypertension Daughter    Hypertension Maternal Grandmother    Arthritis Maternal Grandmother    Heart disease Maternal Grandfather        MI   Hypertension Maternal Grandfather    Arthritis Maternal Grandfather    Hypertension Son    Esophageal  cancer Neg Hx     Social History   Socioeconomic History   Marital status: Single    Spouse name: [name]   Number of children: 3   Years of education: [name]   Highest education level: High school graduate  Occupational History   [name]  Tobacco Use   Smoking status: Never    Passive exposure: Never   Smokeless tobacco: Never  Vaping Use   Vaping status: Never Used  Substance and Sexual Activity   Alcohol use: No   Drug use: No   Sexual activity: Not Currently  Other Topics Concern   [name]  Social History Narrative   Lives at home alone   Retired   Caffeine: coffee   Social Drivers of Health   Financial Resource Strain: Medium Risk (07/08/2023)   Overall Financial Resource Strain (CARDIA)    Difficulty of Paying Living Expenses: Somewhat hard  Food Insecurity: Low Risk  (09/19/2023)   Received from [facility]   Hunger Vital Sign    Worried About Running Out of Food in the Last Year: Never true    Ran Out of Food in the Last Year: Never true  Transportation Needs: No Transportation Needs (09/19/2023)   Received from [facility]    In the past 12 months, has lack of reliable transportation kept you from medical appointments, meetings, work or from getting things needed for daily living? : No  Physical Activity: Inactive (07/08/2023)   Exercise Vital Sign    Days of Exercise per Week: 0 days    Minutes of Exercise per Session: 0 min  Stress: No Stress Concern Present (01/21/2023)   [employer] of Occupational Health - Occupational Stress Questionnaire    Feeling of Stress : Not at all  Social Connections: Moderately Isolated (07/08/2023)   Social Connection and Isolation Panel [NHANES]    Frequency of Communication with Friends and Family: More than three times a week    Frequency of Social Gatherings with Friends and Family: Twice a week    Attends Religious Services: More than 4 times per year    Active Member of  [employer] or Organizations: No    Attends Banker Meetings: Never    Marital Status: Divorced  Catering Manager Violence: Not At Risk (03/28/2023)   Humiliation, Afraid, Rape, and Kick questionnaire    Fear of Current or Ex-Partner: No    Emotionally Abused: No    Physically Abused: No    Sexually Abused: No    Outpatient Medications Prior to Visit  Medication Sig Dispense Refill   acetaminophen  (TYLENOL ) 500 MG tablet Take 500 mg by mouth every 6 (six) hours as needed.     acyclovir  (ZOVIRAX ) 400 MG tablet Take 1 tablet (400 mg total) by mouth 2 (two) times daily. 60 tablet 3   albuterol  (  PROVENTIL ) (2.5 MG/3ML) 0.083% nebulizer solution Take 3 mLs (2.5 mg total) by nebulization every 6 (six) hours as needed for wheezing or shortness of breath. 150 mL 2   albuterol  (VENTOLIN  HFA) 108 (90 Base) MCG/ACT inhaler Inhale 2 puffs into the lungs every 6 (six) hours as needed for wheezing or shortness of breath. 18 g 5   ALPRAZolam  (XANAX ) 0.25 MG tablet Take 1-2 tablets (0.25-0.5 mg total) by mouth 3 (three) times daily as needed for anxiety. 40 tablet 1   amLODipine  (NORVASC ) 5 MG tablet Take 5 mg by mouth daily.     aspirin  EC 81 MG tablet Take 81 mg by mouth daily. Swallow whole.     budesonide  (PULMICORT ) 0.5 MG/2ML nebulizer solution Take 2 mLs (0.5 mg total) by nebulization 2 (two) times daily as needed (cough, wheezing or shortness of breath). 120 mL 11   celecoxib  (CELEBREX ) 100 MG capsule Take 100 mg by mouth 2 (two) times daily.     cetirizine  (ZYRTEC ) 10 MG tablet Take 10 mg by mouth at bedtime.     Cholecalciferol (VITAMIN D3) 50 MCG (2000 UT) TABS Take 2,000 Units by mouth in the morning.     cholestyramine  light (PREVALITE ) 4 g packet Take 4 g by mouth 2 (two) times daily.     dicyclomine  (BENTYL ) 10 MG capsule Take 1 capsule (10 mg total) by mouth 4 (four) times daily -  before meals and at bedtime. cramping 90 capsule 1   diphenoxylate -atropine  (LOMOTIL ) 2.5-0.025 MG  tablet Take 1 tablet by mouth 2 (two) times daily. 60 tablet 6   famotidine  (PEPCID ) 20 MG tablet Take 1 tablet (20 mg total) by mouth at bedtime. 30 tablet 2   fluconazole  (DIFLUCAN ) 150 MG tablet Take 1 tablet today as directed; repeat after 72 hours 2 tablet 0   fluticasone  (FLONASE ) 50 MCG/ACT nasal spray Place 2 sprays into both nostrils daily. 16 g 0   furosemide  (LASIX ) 20 MG tablet Take 20 mg by mouth daily as needed (fluid retention (feet swelling)).     HYDROcodone  bit-homatropine (HYDROMET) 5-1.5 MG/5ML syrup Take 5 mLs by mouth every 8 (eight) hours as needed for cough. 120 mL 0   hydrOXYzine  (ATARAX ) 10 MG tablet TAKE ONE (1) TABLET BY MOUTH 3 TIMES DAILY AS NEEDED FOR ITCHING 30 tablet 2   hyoscyamine  (LEVSIN  SL) 0.125 MG SL tablet Place 1 tablet (0.125 mg total) under the tongue every 4 (four) hours as needed. 30 tablet 1   lidocaine -prilocaine  (EMLA ) cream Apply 1 Application topically as needed. 30 g 0   loperamide  (IMODIUM ) 2 MG capsule Take 4 mg by mouth in the morning and at bedtime.     losartan  (COZAAR ) 50 MG tablet Take 50 mg by mouth in the morning and at bedtime.     Magnesium  Glycinate 100 MG CAPS Take 200 mg by mouth at bedtime.     meclizine  (ANTIVERT ) 12.5 MG tablet Take 12.5 mg by mouth 2 (two) times daily as needed for dizziness or nausea.     metoprolol  succinate (TOPROL -XL) 50 MG 24 hr tablet Take 1 tablet (50 mg total) by mouth 2 (two) times daily. (Patient taking differently: Take 50 mg by mouth daily.) 60 tablet 2   montelukast  (SINGULAIR ) 10 MG tablet Take 1 tablet (10 mg total) by mouth at bedtime. Please start taking this on September 4. 30 tablet 3   Multiple Vitamins-Minerals (CENTRUM SILVER  WOMEN 50+ PO) Take 1 tablet by mouth daily at 6 (six) AM.  neomycin -polymyxin-hydrocortisone (CORTISPORIN) OTIC solution Place 4 drops into both ears 4 (four) times daily. 10 mL 4   nortriptyline  (PAMELOR ) 10 MG capsule Take 1 capsule (10 mg total) by mouth at bedtime.  30 capsule 1   omeprazole  (PRILOSEC) 20 MG capsule SMARTSIG:1.0 Capsule(s) By Mouth Daily     ondansetron  (ZOFRAN ) 8 MG tablet Take 1 tablet (8 mg total) by mouth every 8 (eight) hours as needed for nausea or vomiting. 30 tablet 1   oxyCODONE -acetaminophen  (PERCOCET) 5-325 MG tablet Take 2 tablets by mouth every 6 (six) hours as needed. 10 tablet 0   oxyCODONE -acetaminophen  (PERCOCET/ROXICET) 5-325 MG tablet Take 1 tablet by mouth every 6 (six) hours as needed for severe pain.     pantoprazole  (PROTONIX ) 40 MG tablet Take 1 tablet (40 mg total) by mouth daily. 30 tablet 3   potassium chloride  SA (KLOR-CON  M) 20 MEQ tablet TAKE 1 TABLET BY MOUTH EVERY MONDAY, WEDNESDAY, AND FRIDAY. 30 tablet 5   prochlorperazine  (COMPAZINE ) 10 MG tablet Take 0.5 tablets (5 mg total) by mouth 2 (two) times daily as needed for nausea or vomiting. 60 tablet 1   Respiratory Therapy Supplies (PILLOW MASK/ADULT) MISC . Provide PAP supplies (expires 12 months from 09/21/2023) .     Saccharomyces boulardii (FLORASTOR PO) Take 500 mg by mouth daily at 6 (six) AM.     scopolamine  (TRANSDERM-SCOP) 1 MG/3DAYS Place 1 patch (1.5 mg total) onto the skin every 3 (three) days. 10 patch 1   sertraline  (ZOLOFT ) 50 MG tablet Take 1 tablet (50 mg total) by mouth daily. 30 tablet 3   spironolactone  (ALDACTONE ) 25 MG tablet Take 25 mg by mouth daily.     sulfamethoxazole -trimethoprim  (BACTRIM  DS) 800-160 MG tablet Take 1 tablet by mouth 2 (two) times daily. 10 tablet 0   Facility-Administered Medications Prior to Visit  Medication Dose Route Frequency Provider Last Rate Last Admin   acetaminophen  (TYLENOL ) tablet 650 mg  650 mg Oral Once Ennever, Peter R, MD       nitroGLYCERIN  (NITROSTAT ) SL tablet 0.4 mg  0.4 mg Sublingual Once Saamir Armstrong A, MD       sodium chloride  flush (NS) 0.9 % injection 10 mL  10 mL Intracatheter PRN Timmy Maude SAUNDERS, MD   10 mL at 09/24/23 1640    Allergies  Allergen Reactions   Benazepril   Anaphylaxis, Swelling and Hives    angioedema Throat and lip swelling   Ondansetron  Hcl Hives    Redness and hives post IV admin on 07/05/17   Codeine Nausea And Vomiting   Morphine Hives    Redness and hives noted post IV admin on 07/05/17    Review of Systems  Constitutional:  Positive for malaise/fatigue. Negative for chills and fever.  HENT:  Negative for congestion and hearing loss.   Eyes:  Negative for discharge.  Respiratory:  Positive for shortness of breath. Negative for cough and sputum production.   Cardiovascular:  Negative for chest pain, palpitations and leg swelling.  Gastrointestinal:  Positive for diarrhea. Negative for abdominal pain, blood in stool, constipation, heartburn, nausea and vomiting.  Genitourinary:  Positive for frequency and urgency. Negative for dysuria and hematuria.  Musculoskeletal:  Negative for back pain, falls and myalgias.  Skin:  Negative for rash.  Neurological:  Negative for dizziness, sensory change, loss of consciousness, weakness and headaches.  Endo/Heme/Allergies:  Negative for environmental allergies. Does not bruise/bleed easily.  Psychiatric/Behavioral:  Negative for depression and suicidal ideas. The patient is not nervous/anxious and  does not have insomnia.        Objective:    Physical Exam Constitutional:      General: She is not in acute distress.    Appearance: Normal appearance. She is not diaphoretic.  HENT:     Head: Normocephalic and atraumatic.     Right Ear: Tympanic membrane, ear canal and external ear normal.     Left Ear: Tympanic membrane, ear canal and external ear normal.     Nose: Nose normal.     Mouth/Throat:     Mouth: Mucous membranes are moist.     Pharynx: Oropharynx is clear. No oropharyngeal exudate.  Eyes:     General: No scleral icterus.       Right eye: No discharge.        Left eye: No discharge.     Conjunctiva/sclera: Conjunctivae normal.     Pupils: Pupils are equal, round, and reactive  to light.  Neck:     Thyroid : No thyromegaly.  Cardiovascular:     Rate and Rhythm: Normal rate and regular rhythm.     Heart sounds: Normal heart sounds. No murmur heard. Pulmonary:     Effort: Pulmonary effort is normal. No respiratory distress.     Breath sounds: Normal breath sounds. No wheezing or rales.  Abdominal:     General: Bowel sounds are normal. There is no distension.     Palpations: Abdomen is soft. There is no mass.     Tenderness: There is no abdominal tenderness.  Musculoskeletal:        General: No tenderness. Normal range of motion.     Cervical back: Normal range of motion and neck supple.  Lymphadenopathy:     Cervical: No cervical adenopathy.  Skin:    General: Skin is warm and dry.     Findings: No rash.  Neurological:     General: No focal deficit present.     Mental Status: She is alert and oriented to person, place, and time.     Cranial Nerves: No cranial nerve deficit.     Coordination: Coordination normal.     Deep Tendon Reflexes: Reflexes are normal and symmetric. Reflexes normal.  Psychiatric:        Mood and Affect: Mood normal.        Behavior: Behavior normal.        Thought Content: Thought content normal.        Judgment: Judgment normal.     BP 138/86 (BP Location: Left Arm, Patient Position: Sitting)   Pulse 85   Temp 98 F (36.7 C) (Oral)   Resp 18   Ht 5' 6 (1.676 m)   Wt 191 lb 3.2 oz (86.7 kg)   SpO2 96%   BMI 30.86 kg/m  Wt Readings from Last 3 Encounters:  11/18/23 191 lb 3.2 oz (86.7 kg)  11/16/23 192 lb (87.1 kg)  10/29/23 191 lb 6.4 oz (86.8 kg)    Diabetic Foot Exam - Simple   No data filed    Lab Results  Component Value Date   WBC 3.9 (L) 11/18/2023   HGB 12.0 11/18/2023   HCT 37.2 11/18/2023   PLT 99.0 (L) 11/18/2023   GLUCOSE 101 (H) 11/18/2023   CHOL 179 06/14/2023   TRIG 94.0 06/14/2023   HDL 46.90 06/14/2023   LDLCALC 113 (H) 06/14/2023   ALT 14 11/18/2023   AST 20 11/18/2023   NA 142  11/18/2023   K 4.2 11/18/2023   CL 108  11/18/2023   CREATININE 0.77 11/18/2023   BUN 20 11/18/2023   CO2 25 11/18/2023   TSH 1.40 09/16/2023   INR 0.90 07/28/2018   HGBA1C 5.5 09/16/2023    Lab Results  Component Value Date   TSH 1.40 09/16/2023   Lab Results  Component Value Date   WBC 3.9 (L) 11/18/2023   HGB 12.0 11/18/2023   HCT 37.2 11/18/2023   MCV 93.2 11/18/2023   PLT 99.0 (L) 11/18/2023   Lab Results  Component Value Date   NA 142 11/18/2023   K 4.2 11/18/2023   CHLORIDE 108 12/31/2016   CO2 25 11/18/2023   GLUCOSE 101 (H) 11/18/2023   BUN 20 11/18/2023   CREATININE 0.77 11/18/2023   BILITOT 0.5 11/18/2023   ALKPHOS 48 11/18/2023   AST 20 11/18/2023   ALT 14 11/18/2023   PROT 6.8 11/18/2023   ALBUMIN 4.1 11/18/2023   CALCIUM 9.2 11/18/2023   ANIONGAP 7 11/16/2023   EGFR 90.0 09/03/2023   GFR 78.68 11/18/2023   Lab Results  Component Value Date   CHOL 179 06/14/2023   Lab Results  Component Value Date   HDL 46.90 06/14/2023   Lab Results  Component Value Date   LDLCALC 113 (H) 06/14/2023   Lab Results  Component Value Date   TRIG 94.0 06/14/2023   Lab Results  Component Value Date   CHOLHDL 4 06/14/2023   Lab Results  Component Value Date   HGBA1C 5.5 09/16/2023       Assessment & Plan:  Diarrhea, unspecified type Assessment & Plan: Seen in ED again with flare in diarrhea, repeat stool test for CDiff is pending, patient would benefit from a bedside commode due to history of weakness, falls, diarrhea, stroke, recurrent dizziness and more. Will prescribe. She is unable to ambulate safely and any more than 10 feet especially during a diarrhea flare.   Orders: -     Clostridium difficile culture-fecal -     AMB Referral [facility] Care Management -     Comprehensive metabolic panel -     CBC with Differential/Platelet -     Magnesium   Hyperglycemia Assessment & Plan: hgba1c acceptable, minimize simple carbs. Increase exercise as  tolerated.   Orders: -     AMB Referral [facility]I Care Management -     Comprehensive metabolic panel  Hyperlipidemia, unspecified hyperlipidemia type Assessment & Plan: Encourage heart healthy diet such as MIND or DASH diet, increase exercise, avoid trans fats, simple carbohydrates and processed foods, consider a krill or fish or flaxseed oil cap daily.   Orders: -     AMB Referral [facility] Care Management  Primary hypertension Assessment & Plan: Well controlled, no changes to meds. Encouraged heart healthy diet such as the DASH diet and exercise as tolerated.    Orders: -     AMB Referral [facility] Care Management  Hypomagnesemia Assessment & Plan: Supplement and monitor   Orders: -     Magnesium   Chronic combined systolic and diastolic congestive heart failure (HCC) -     AMB Referral [facility]I Care Management  Urinary frequency -     Urinalysis -     Urine Culture  Other orders -     metroNIDAZOLE ; Take 1 tablet (500 mg total) by mouth 3 (three) times daily for 7 days.  Dispense: 21 tablet; Refill: 0    Assessment and Plan    Social Determinants of Health Patient lives alone and requires assistance with personal care services and home upkeep.  Difficulty with medication management and transportation. -Referral to social work and nursing for evaluation of needs and assistance with reestablishing personal care services. -Pharmacy evaluation for medication management. -Assistance with transportation needs.  Urinary Tract Infection Recent UTI treated with antibiotics, which may have contributed to diarrhea. Patient reports decreased urine output and feeling unwell. -Collect urine sample for culture and sensitivity. -Consider treatment for bacterial vaginosis with Metronidazole  500mg  TID for 7 days, if symptoms persist.  Diarrhea Recent increase in diarrhea, possibly related to antibiotic use for UTI. History of C. diff infection. -Wait for C. diff test results before initiating  treatment, unless symptoms worsen.  Congestive Heart Failure and Cancer Complex care management required. -Continue current management plan.  Colonoscopy Scheduled for February 3rd. -Continue with planned procedure.  Handicap Sign Request for a new handicap sign for vehicle. -Initiate process for obtaining a new handicap sign.  Follow-up In 8 weeks or sooner if needed.         Harlene Horton, MD

## 2023-11-19 ENCOUNTER — Encounter: Payer: Self-pay | Admitting: Hematology & Oncology

## 2023-11-19 ENCOUNTER — Telehealth: Payer: Self-pay | Admitting: *Deleted

## 2023-11-19 ENCOUNTER — Other Ambulatory Visit: Payer: Self-pay

## 2023-11-19 ENCOUNTER — Inpatient Hospital Stay: Payer: 59

## 2023-11-19 DIAGNOSIS — I10 Essential (primary) hypertension: Secondary | ICD-10-CM | POA: Diagnosis not present

## 2023-11-19 DIAGNOSIS — I5042 Chronic combined systolic (congestive) and diastolic (congestive) heart failure: Secondary | ICD-10-CM | POA: Diagnosis not present

## 2023-11-19 LAB — CBC WITH DIFFERENTIAL/PLATELET
Basophils Absolute: 0 10*3/uL (ref 0.0–0.1)
Basophils Relative: 0.8 % (ref 0.0–3.0)
Eosinophils Absolute: 0.1 10*3/uL (ref 0.0–0.7)
Eosinophils Relative: 2.3 % (ref 0.0–5.0)
HCT: 37.2 % (ref 36.0–46.0)
Hemoglobin: 12 g/dL (ref 12.0–15.0)
Lymphocytes Relative: 23.6 % (ref 12.0–46.0)
Lymphs Abs: 0.9 10*3/uL (ref 0.7–4.0)
MCHC: 32.1 g/dL (ref 30.0–36.0)
MCV: 93.2 fL (ref 78.0–100.0)
Monocytes Absolute: 0.3 10*3/uL (ref 0.1–1.0)
Monocytes Relative: 8.7 % (ref 3.0–12.0)
Neutro Abs: 2.5 10*3/uL (ref 1.4–7.7)
Neutrophils Relative %: 64.6 % (ref 43.0–77.0)
Platelets: 99 10*3/uL — ABNORMAL LOW (ref 150.0–400.0)
RBC: 3.99 Mil/uL (ref 3.87–5.11)
RDW: 14.7 % (ref 11.5–15.5)
WBC: 3.9 10*3/uL — ABNORMAL LOW (ref 4.0–10.5)

## 2023-11-19 LAB — URINE CULTURE
MICRO NUMBER:: 15937937
SPECIMEN QUALITY:: ADEQUATE

## 2023-11-19 LAB — MAGNESIUM: Magnesium: 2 mg/dL (ref 1.5–2.5)

## 2023-11-19 LAB — COMPREHENSIVE METABOLIC PANEL
ALT: 14 U/L (ref 0–35)
AST: 20 U/L (ref 0–37)
Albumin: 4.1 g/dL (ref 3.5–5.2)
Alkaline Phosphatase: 48 U/L (ref 39–117)
BUN: 20 mg/dL (ref 6–23)
CO2: 25 meq/L (ref 19–32)
Calcium: 9.2 mg/dL (ref 8.4–10.5)
Chloride: 108 meq/L (ref 96–112)
Creatinine, Ser: 0.77 mg/dL (ref 0.40–1.20)
GFR: 78.68 mL/min (ref >60.00)
Glucose, Bld: 101 mg/dL — ABNORMAL HIGH (ref 70–99)
Potassium: 4.2 meq/L (ref 3.5–5.1)
Sodium: 142 meq/L (ref 135–145)
Total Bilirubin: 0.5 mg/dL (ref 0.2–1.2)
Total Protein: 6.8 g/dL (ref 6.0–8.3)

## 2023-11-19 NOTE — Progress Notes (Signed)
 Complex Care Management Note  Care Guide Note 11/19/2023 Name: SEJAL COFIELD MRN: [id number] DOB: 07/18/1956  MACIAH FEEBACK is a 68 y.o. year old female who sees Domenica Harlene DELENA, MD for primary care. I reached out to Avelina DELENA Gentry by phone today to offer complex care management services.  Ms. Hickmon was given information about Complex Care Management services today including:   The Complex Care Management services include support from the care team which includes your Nurse Coordinator, Clinical Social Worker, or Pharmacist.  The Complex Care Management team is here to help remove barriers to the health concerns and goals most important to you. Complex Care Management services are voluntary, and the patient may decline or stop services at any time by request to their care team member.   Complex Care Management Consent Status: Patient agreed to services and verbal consent obtained.   Follow up plan:  Telephone appointment with complex care management team member scheduled for:  11/25/2023  Encounter Outcome:  Patient Scheduled  Thedford Franks, [facility] Care Coordination Care Guide Direct Dial : [phone]

## 2023-11-20 ENCOUNTER — Encounter: Payer: Self-pay | Admitting: Family Medicine

## 2023-11-23 ENCOUNTER — Other Ambulatory Visit: Payer: Self-pay | Admitting: Family Medicine

## 2023-11-24 DIAGNOSIS — K529 Noninfective gastroenteritis and colitis, unspecified: Secondary | ICD-10-CM | POA: Diagnosis not present

## 2023-11-24 DIAGNOSIS — R197 Diarrhea, unspecified: Secondary | ICD-10-CM | POA: Diagnosis not present

## 2023-11-25 ENCOUNTER — Telehealth: Payer: Self-pay | Admitting: Emergency Medicine

## 2023-11-25 ENCOUNTER — Telehealth: Payer: Self-pay

## 2023-11-25 ENCOUNTER — Ambulatory Visit: Payer: Self-pay

## 2023-11-25 DIAGNOSIS — M1712 Unilateral primary osteoarthritis, left knee: Secondary | ICD-10-CM | POA: Diagnosis not present

## 2023-11-25 DIAGNOSIS — M25562 Pain in left knee: Secondary | ICD-10-CM | POA: Diagnosis not present

## 2023-11-25 LAB — C.DIFF TOXINB QL PCR: CLOSTRIDIUM DIFFICILE TOXINB,QL REAL TIME PCR: DETECTED — CR

## 2023-11-25 LAB — CLOSTRIDIUM DIFFICILE CULTURE-FECAL

## 2023-11-25 MED ORDER — FIDAXOMICIN 200 MG PO TABS: 200.0000 mg | ORAL_TABLET | Freq: Two times a day (BID) | ORAL | 0 refills | Status: DC

## 2023-11-25 NOTE — Patient Outreach (Signed)
  Care Coordination   11/25/2023 Name: TIFFANEE TSAI MRN: [id number] DOB: 25-Jul-1956   Care Coordination Outreach Attempts:  An unsuccessful outreach was attempted for an appointment today.  Follow Up Plan:  Additional outreach attempts will be made to offer the patient complex care management information and services.   Encounter Outcome:  No Answer   Care Coordination Interventions:  No, not indicated    Lysle Morales, BSW [place]  [facility] Social Worker Direct Dial: [phone]  Fax: [phone] Website: [email or web address]

## 2023-11-25 NOTE — Telephone Encounter (Signed)
Copied from CRM [id number]. Topic: General - Other >> Nov 25, 2023  4:08 PM Florestine Avers wrote: Reason for CRM: Patient asked for Shaquita (Dr. Elby Showers nurse) to call her in regards to some medial equipment that was sent to her house, the patient sounded upset.

## 2023-11-25 NOTE — Telephone Encounter (Signed)
Tried calling Pt- no answer, vm box full. Will send Rx, okay for e2c2 to relay results.

## 2023-11-25 NOTE — Telephone Encounter (Signed)
CRITICAL VALUE STICKER  CRITICAL VALUE: C. DIFF positive  RECEIVER (on-site recipient of call): Tami [name]  DATE & TIME NOTIFIED: 11/25/23 4:28PM  MESSENGER (representative from lab): [facility]  MD NOTIFIED: Dr. Laury Axon  TIME OF NOTIFICATION: 11/25/23 4:29pm  RESPONSE:

## 2023-11-25 NOTE — Telephone Encounter (Signed)
Per Dr. Laury Axon- + c ciff--- please inform pt and send in fidaxomicin 200 mg bid x 10 days

## 2023-11-26 ENCOUNTER — Inpatient Hospital Stay: Payer: 59

## 2023-11-26 ENCOUNTER — Encounter: Payer: Self-pay | Admitting: Hematology & Oncology

## 2023-11-26 ENCOUNTER — Inpatient Hospital Stay (HOSPITAL_BASED_OUTPATIENT_CLINIC_OR_DEPARTMENT_OTHER): Payer: 59 | Admitting: Hematology & Oncology

## 2023-11-26 ENCOUNTER — Other Ambulatory Visit: Payer: Self-pay

## 2023-11-26 VITALS — BP 154/85 | HR 94 | Temp 98.4°F | Resp 18 | Ht 66.0 in | Wt 193.0 lb

## 2023-11-26 DIAGNOSIS — D801 Nonfamilial hypogammaglobulinemia: Secondary | ICD-10-CM | POA: Diagnosis not present

## 2023-11-26 DIAGNOSIS — C9002 Multiple myeloma in relapse: Secondary | ICD-10-CM

## 2023-11-26 DIAGNOSIS — C9000 Multiple myeloma not having achieved remission: Secondary | ICD-10-CM

## 2023-11-26 DIAGNOSIS — Z79899 Other long term (current) drug therapy: Secondary | ICD-10-CM | POA: Diagnosis not present

## 2023-11-26 DIAGNOSIS — Z5112 Encounter for antineoplastic immunotherapy: Secondary | ICD-10-CM | POA: Diagnosis not present

## 2023-11-26 DIAGNOSIS — Z5111 Encounter for antineoplastic chemotherapy: Secondary | ICD-10-CM | POA: Diagnosis not present

## 2023-11-26 LAB — LACTATE DEHYDROGENASE: LDH: 207 U/L — ABNORMAL HIGH (ref 98–192)

## 2023-11-26 LAB — CMP (CANCER CENTER ONLY)
ALT: 12 U/L (ref 0–44)
AST: 17 U/L (ref 15–41)
Albumin: 4 g/dL (ref 3.5–5.0)
Alkaline Phosphatase: 46 U/L (ref 38–126)
Anion gap: 9 (ref 5–15)
BUN: 18 mg/dL (ref 8–23)
CO2: 25 mmol/L (ref 22–32)
Calcium: 9.2 mg/dL (ref 8.9–10.3)
Chloride: 108 mmol/L (ref 98–111)
Creatinine: 0.6 mg/dL (ref 0.44–1.00)
GFR, Estimated: >60 mL/min (ref >60)
Glucose, Bld: 152 mg/dL — ABNORMAL HIGH (ref 70–99)
Potassium: 3.6 mmol/L (ref 3.5–5.1)
Sodium: 142 mmol/L (ref 135–145)
Total Bilirubin: 0.5 mg/dL (ref 0.0–1.2)
Total Protein: 6.7 g/dL (ref 6.5–8.1)

## 2023-11-26 LAB — CBC WITH DIFFERENTIAL (CANCER CENTER ONLY)
Abs Immature Granulocytes: 0.02 10*3/uL (ref 0.00–0.07)
Basophils Absolute: 0 10*3/uL (ref 0.0–0.1)
Basophils Relative: 1 %
Eosinophils Absolute: 0.1 10*3/uL (ref 0.0–0.5)
Eosinophils Relative: 3 %
HCT: 34.6 % — ABNORMAL LOW (ref 36.0–46.0)
Hemoglobin: 11.5 g/dL — ABNORMAL LOW (ref 12.0–15.0)
Immature Granulocytes: 1 %
Lymphocytes Relative: 24 %
Lymphs Abs: 0.7 10*3/uL (ref 0.7–4.0)
MCH: 30.5 pg (ref 26.0–34.0)
MCHC: 33.2 g/dL (ref 30.0–36.0)
MCV: 91.8 fL (ref 80.0–100.0)
Monocytes Absolute: 0.3 10*3/uL (ref 0.1–1.0)
Monocytes Relative: 9 %
Neutro Abs: 1.8 10*3/uL (ref 1.7–7.7)
Neutrophils Relative %: 62 %
Platelet Count: 178 10*3/uL (ref 150–400)
RBC: 3.77 MIL/uL — ABNORMAL LOW (ref 3.87–5.11)
RDW: 13.6 % (ref 11.5–15.5)
WBC Count: 2.9 10*3/uL — ABNORMAL LOW (ref 4.0–10.5)
nRBC: 0 % (ref 0.0–0.2)

## 2023-11-26 LAB — IRON AND IRON BINDING CAPACITY (CC-WL,HP ONLY)
Iron: 76 ug/dL (ref 28–170)
Saturation Ratios: 20 % (ref 10.4–31.8)
TIBC: 385 ug/dL (ref 250–450)
UIBC: 309 ug/dL (ref 148–442)

## 2023-11-26 LAB — FERRITIN: Ferritin: 166 ng/mL (ref 11–307)

## 2023-11-26 MED ORDER — HEPARIN SOD (PORK) LOCK FLUSH 100 UNIT/ML IV SOLN
500.0000 [IU] | Freq: Once | INTRAVENOUS | Status: AC | PRN
  Administered 2023-11-26: 500 [IU]

## 2023-11-26 MED ORDER — DEXTROSE 5 % IV SOLN
56.0000 mg/m2 | Freq: Once | INTRAVENOUS | Status: AC
  Administered 2023-11-26: 110 mg via INTRAVENOUS
  Filled 2023-11-26: qty 30

## 2023-11-26 MED ORDER — DIPHENHYDRAMINE HCL 50 MG/ML IJ SOLN
50.0000 mg | Freq: Once | INTRAMUSCULAR | Status: AC
  Administered 2023-11-26: 50 mg via INTRAVENOUS
  Filled 2023-11-26: qty 1

## 2023-11-26 MED ORDER — FAMOTIDINE IN NACL 20-0.9 MG/50ML-% IV SOLN
20.0000 mg | Freq: Once | INTRAVENOUS | Status: AC
  Administered 2023-11-26: 20 mg via INTRAVENOUS
  Filled 2023-11-26: qty 50

## 2023-11-26 MED ORDER — SODIUM CHLORIDE 0.9 % IV SOLN
10.0000 mg/kg | Freq: Once | INTRAVENOUS | Status: AC
  Administered 2023-11-26: 900 mg via INTRAVENOUS
  Filled 2023-11-26: qty 25

## 2023-11-26 MED ORDER — SODIUM CHLORIDE 0.9 % IV SOLN
Freq: Once | INTRAVENOUS | Status: AC
Start: 2023-11-26 — End: 2023-11-26

## 2023-11-26 MED ORDER — ACETAMINOPHEN 325 MG PO TABS
650.0000 mg | ORAL_TABLET | Freq: Once | ORAL | Status: DC
Start: 2023-11-26 — End: 2023-11-26
  Filled 2023-11-26: qty 2

## 2023-11-26 MED ORDER — SODIUM CHLORIDE 0.9% FLUSH
10.0000 mL | INTRAVENOUS | Status: DC | PRN
  Administered 2023-11-26: 10 mL

## 2023-11-26 MED ORDER — SODIUM CHLORIDE 0.9 % IV SOLN
20.0000 mg | Freq: Once | INTRAVENOUS | Status: AC
  Administered 2023-11-26: 19.9992 mg via INTRAVENOUS
  Filled 2023-11-26: qty 20

## 2023-11-26 MED ORDER — SODIUM CHLORIDE 0.9 % IV SOLN
300.0000 mg/m2 | Freq: Once | INTRAVENOUS | Status: AC
  Administered 2023-11-26: 600 mg via INTRAVENOUS
  Filled 2023-11-26: qty 30

## 2023-11-26 MED ORDER — SODIUM CHLORIDE 0.9 % IV SOLN: Freq: Once | INTRAVENOUS | Status: DC

## 2023-11-26 NOTE — Patient Instructions (Signed)
Carfilzomib Injection What is this medication? CARFILZOMIB (kar FILZ oh mib) treats multiple myeloma, a type of bone marrow cancer. It works by blocking a protein that causes cancer cells to grow and multiply. This helps to slow or stop the spread of cancer cells. This medicine may be used for other purposes; ask your health care provider or pharmacist if you have questions. COMMON BRAND NAME(S): KYPROLIS What should I tell my care team before I take this medication? They need to know if you have any of these conditions: Heart disease History of blood clots Irregular heartbeat Kidney disease Liver disease Lung or breathing disease An unusual or allergic reaction to carfilzomib, or other medications, foods, dyes, or preservatives If you or your partner are pregnant or trying to get pregnant Breastfeeding How should I use this medication? This medication is injected into a vein. It is given by your care team in a hospital or clinic setting. Talk to your care team about the use of this medication in children. Special care may be needed. Overdosage: If you think you have taken too much of this medicine contact a poison control center or emergency room at once. NOTE: This medicine is only for you. Do not share this medicine with others. What if I miss a dose? Keep appointments for follow-up doses. It is important not to miss your dose. Call your care team if you are unable to keep an appointment. What may interact with this medication? Interactions are not expected. This list may not describe all possible interactions. Give your health care provider a list of all the medicines, herbs, non-prescription drugs, or dietary supplements you use. Also tell them if you smoke, drink alcohol, or use illegal drugs. Some items may interact with your medicine. What should I watch for while using this medication? Your condition will be monitored carefully while you are receiving this medication. You may need  blood work while taking this medication. Check with your care team if you have severe diarrhea, nausea, and vomiting, or if you sweat a lot. The loss of too much body fluid may make it dangerous for you to take this medication. This medication may affect your coordination, reaction time, or judgment. Do not drive or operate machinery until you know how this medication affects you. Sit up or stand slowly to reduce the risk of dizzy or fainting spells. Drinking alcohol with this medication can increase the risk of these side effects. Talk to your care team if you may be pregnant. Serious birth defects can occur if you take this medication during pregnancy and for 6 months after the last dose. You will need a negative pregnancy test before starting this medication. Contraception is recommended while taking this medication and for 6 months after the last dose. Your care team can help you find an option that works for you. If your partner can get pregnant, use a condom during sex while taking this medication and for 3 months after the last dose. Do not breastfeed while taking this medication and for 2 weeks after the last dose. This medication may cause infertility. Talk to your care team if you are concerned about your fertility. What side effects may I notice from receiving this medication? Side effects that you should report to your care team as soon as possible: Allergic reactions--skin rash, itching, hives, swelling of the face, lips, tongue, or throat Bleeding--bloody or black, tar-like stools, vomiting blood or brown material that looks like coffee grounds, red or dark brown urine,  small red or purple spots on skin, unusual bruising or bleeding Blood clot--pain, swelling, or warmth in the leg, shortness of breath, chest pain Dizziness, loss of balance or coordination, confusion or trouble speaking Heart attack--pain or tightness in the chest, shoulders, arms, or jaw, nausea, shortness of breath, cold  or clammy skin, feeling faint or lightheaded Heart failure--shortness of breath, swelling of the ankles, feet, or hands, sudden weight gain, unusual weakness or fatigue Heart rhythm changes--fast or irregular heartbeat, dizziness, feeling faint or lightheaded, chest pain, trouble breathing Increase in blood pressure Infection--fever, chills, cough, sore throat, wounds that don't heal, pain or trouble when passing urine, general feeling of discomfort or being unwell Infusion reactions--chest pain, shortness of breath or trouble breathing, feeling faint or lightheaded Kidney injury--decrease in the amount of urine, swelling of the ankles, hands, or feet Liver injury--right upper belly pain, loss of appetite, nausea, light-colored stool, dark yellow or brown urine, yellowing skin or eyes, unusual weakness or fatigue Lung injury--shortness of breath or trouble breathing, cough, spitting up blood, chest pain, fever Pulmonary hypertension--shortness of breath, chest pain, fast or irregular heartbeat, feeling faint or lightheaded, fatigue, swelling of the ankles or feet Stomach pain, bloody diarrhea, pale skin, unusual weakness or fatigue, decrease in the amount of urine, which may be signs of hemolytic uremic syndrome Sudden and severe headache, confusion, change in vision, seizures, which may be signs of posterior reversible encephalopathy syndrome (PRES) TTP--purple spots on the skin or inside the mouth, pale skin, yellowing skin or eyes, unusual weakness or fatigue, fever, fast or irregular heartbeat, confusion, change in vision, trouble speaking, trouble walking Tumor lysis syndrome (TLS)--nausea, vomiting, diarrhea, decrease in the amount of urine, dark urine, unusual weakness or fatigue, confusion, muscle pain or cramps, fast or irregular heartbeat, joint pain Side effects that usually do not require medical attention (report to your care team if they continue or are  bothersome): Diarrhea Fatigue Nausea Trouble sleeping This list may not describe all possible side effects. Call your doctor for medical advice about side effects. You may report side effects to FDA at 1-800-FDA-1088. Where should I keep my medication? This medication is given in a hospital or clinic. It will not be stored at home. NOTE: This sheet is a summary. It may not cover all possible information. If you have questions about this medicine, talk to your doctor, pharmacist, or health care provider.  2024 Elsevier/Gold Standard (2022-03-26 00:00:00)

## 2023-11-26 NOTE — Progress Notes (Signed)
Hematology and Oncology Follow Up Visit  TORANCE HEINLEN [id number] 1956-02-04 68 y.o. 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 Heironimus is here today for follow-up.  She seems to doing much better.  She has responded incredibly well to treatment.  I am very happy for her.  Her last monoclonal spike was down to 0.9 g/dL.  Her IgG level was down to 1700 mg/dL.  Her kappa light chain was down to 0.9 mg/dL.  Thankfully, she has had no problems with diarrhea lately.  I know she has this on occasion.  She is going to have a colonoscopy I think in February.  We will have to make sure we make some adjustments with her chemotherapy protocol.  She really would like to pull back on the chemotherapy.  I think we can probably do this.  We can see about trying to do treatments every other week.  She has had no fever.  She has had no bleeding.  She has had no rashes.  There is been no leg swelling.  Overall, I would say that her performance status is probably ECOG 1.     Medications:  Allergies as of 11/26/2023       Reactions   Benazepril Anaphylaxis, Swelling, Hives   angioedema Throat and lip swelling   Ondansetron Hcl Hives   Redness and hives post IV admin on 07/05/17   Codeine Nausea And Vomiting   Morphine Hives   Redness and hives noted post IV admin on 07/05/17        Medication List        Accurate as of November 26, 2023  8:39 AM. If you have any questions, ask your nurse or doctor.          acetaminophen 500 MG tablet Commonly  known as: TYLENOL Take 500 mg by mouth every 6 (six) hours as needed.   acyclovir 400 MG tablet Commonly known as: ZOVIRAX Take 1 tablet (400 mg total) by mouth 2 (two) times daily.   albuterol 108 (90 Base) MCG/ACT inhaler Commonly known as: VENTOLIN HFA Inhale 2 puffs into the lungs every 6 (six) hours as needed for wheezing or shortness of breath.   albuterol (2.5 MG/3ML) 0.083% nebulizer solution Commonly known as: PROVENTIL Take 3 mLs (2.5 mg total) by nebulization every 6 (six) hours as needed for wheezing or shortness of breath.   ALPRAZolam 0.25 MG tablet Commonly known as: XANAX Take 1-2 tablets (0.25-0.5 mg total) by mouth 3 (three) times daily as needed for anxiety.   amLODipine 5 MG tablet Commonly known as: NORVASC Take 5 mg by mouth daily.   aspirin EC 81 MG tablet Take 81 mg by mouth daily. Swallow whole.   budesonide 0.5 MG/2ML nebulizer solution Commonly known as: Pulmicort Take 2 mLs (0.5 mg total) by nebulization 2 (two) times daily as needed (cough, wheezing or shortness of breath).   celecoxib 100 MG capsule Commonly known as: CELEBREX Take 100 mg by mouth 2 (two) times daily.   CENTRUM SILVER  WOMEN 50+ PO Take 1 tablet by mouth daily at 6 (six) AM.   cetirizine 10 MG tablet Commonly known as: ZYRTEC Take 10 mg by mouth at bedtime.   cholestyramine light 4 g packet Commonly known as: PREVALITE Take 4 g by mouth 2 (two) times daily.   dicyclomine 10 MG capsule Commonly known as: BENTYL Take 1 capsule (10 mg total) by mouth 4 (four) times daily -  before meals and at bedtime. cramping   diphenoxylate-atropine 2.5-0.025 MG tablet Commonly known as: Lomotil Take 1 tablet by mouth 2 (two) times daily.   famotidine 20 MG tablet Commonly known as: PEPCID Take 1 tablet (20 mg total) by mouth at bedtime.   fidaxomicin 200 MG Tabs tablet Commonly known as: DIFICID Take 1 tablet (200 mg total) by mouth 2 (two) times daily.   FLORASTOR PO Take  500 mg by mouth daily at 6 (six) AM.   fluconazole 150 MG tablet Commonly known as: Diflucan Take 1 tablet today as directed; repeat after 72 hours   fluticasone 50 MCG/ACT nasal spray Commonly known as: FLONASE Place 2 sprays into both nostrils daily.   furosemide 20 MG tablet Commonly known as: LASIX Take 20 mg by mouth daily as needed (fluid retention (feet swelling)).   HYDROcodone bit-homatropine 5-1.5 MG/5ML syrup Commonly known as: Hydromet Take 5 mLs by mouth every 8 (eight) hours as needed for cough.   hydrOXYzine 10 MG tablet Commonly known as: ATARAX TAKE ONE (1) TABLET BY MOUTH 3 TIMES DAILY AS NEEDED FOR ITCHING   hyoscyamine 0.125 MG SL tablet Commonly known as: LEVSIN SL Place 1 tablet (0.125 mg total) under the tongue every 4 (four) hours as needed.   lidocaine-prilocaine cream Commonly known as: EMLA Apply 1 Application topically as needed.   loperamide 2 MG capsule Commonly known as: IMODIUM Take 4 mg by mouth in the morning and at bedtime.   losartan 50 MG tablet Commonly known as: COZAAR Take 50 mg by mouth in the morning and at bedtime.   Magnesium Glycinate 100 MG Caps Take 200 mg by mouth at bedtime.   meclizine 12.5 MG tablet Commonly known as: ANTIVERT Take 12.5 mg by mouth 2 (two) times daily as needed for dizziness or nausea.   metoprolol succinate 50 MG 24 hr tablet Commonly known as: TOPROL-XL Take 1 tablet (50 mg total) by mouth 2 (two) times daily. What changed: when to take this   montelukast 10 MG tablet Commonly known as: Singulair Take 1 tablet (10 mg total) by mouth at bedtime. Please start taking this on September 4.   neomycin-polymyxin-hydrocortisone OTIC solution Commonly known as: CORTISPORIN Place 4 drops into both ears 4 (four) times daily.   nortriptyline 10 MG capsule Commonly known as: Pamelor Take 1 capsule (10 mg total) by mouth at bedtime.   omeprazole 20 MG capsule Commonly known as:  PRILOSEC SMARTSIG:1.0 Capsule(s) By Mouth Daily   ondansetron 8 MG tablet Commonly known as: Zofran Take 1 tablet (8 mg total) by mouth every 8 (eight) hours as needed for nausea or vomiting.   oxyCODONE-acetaminophen 5-325 MG tablet Commonly known as: PERCOCET/ROXICET Take 1 tablet by mouth every 6 (six) hours as needed for severe pain.   oxyCODONE-acetaminophen 5-325 MG tablet Commonly known as: Percocet Take 2 tablets by mouth every 6 (six) hours as needed.   pantoprazole 40 MG tablet Commonly known as: PROTONIX Take 1 tablet (40 mg total) by mouth daily.   Pillow Mask/Adult Misc . Provide PAP supplies (  expires 12 months from 09/21/2023) .   potassium chloride SA 20 MEQ tablet Commonly known as: KLOR-CON M TAKE 1 TABLET BY MOUTH EVERY MONDAY, WEDNESDAY, AND FRIDAY.   prochlorperazine 10 MG tablet Commonly known as: COMPAZINE Take 0.5 tablets (5 mg total) by mouth 2 (two) times daily as needed for nausea or vomiting.   scopolamine 1 MG/3DAYS Commonly known as: TRANSDERM-SCOP Place 1 patch (1.5 mg total) onto the skin every 3 (three) days.   sertraline 50 MG tablet Commonly known as: ZOLOFT Take 1 tablet (50 mg total) by mouth daily.   spironolactone 25 MG tablet Commonly known as: ALDACTONE Take 25 mg by mouth daily.   sulfamethoxazole-trimethoprim 800-160 MG tablet Commonly known as: BACTRIM DS Take 1 tablet by mouth 2 (two) times daily.   Vitamin D3 50 MCG (2000 UT) Tabs Take 2,000 Units by mouth in the morning.        Allergies:  Allergies  Allergen Reactions   Benazepril Anaphylaxis, Swelling and Hives    angioedema Throat and lip swelling   Ondansetron Hcl Hives    Redness and hives post IV admin on 07/05/17   Codeine Nausea And Vomiting   Morphine Hives    Redness and hives noted post IV admin on 07/05/17    Past Medical History, Surgical history, Social history, and Family History were reviewed and updated.  Review of Systems: Review of  Systems  Constitutional: Negative.   HENT: Negative.    Eyes: Negative.   Respiratory: Negative.    Cardiovascular: Negative.   Gastrointestinal: Negative.   Genitourinary: Negative.   Musculoskeletal: Negative.   Skin: Negative.   Neurological: Negative.   Endo/Heme/Allergies: Negative.   Psychiatric/Behavioral: Negative.        Physical Exam: Temperature is 98.4.  Pulse 94.  Blood pressure 154/85.  Weight is 193 pounds.     Wt Readings from Last 3 Encounters:  11/26/23 193 lb (87.5 kg)  11/18/23 191 lb 3.2 oz (86.7 kg)  11/16/23 192 lb (87.1 kg)    Physical Exam Vitals reviewed.  HENT:     Head: Normocephalic and atraumatic.  Eyes:     Pupils: Pupils are equal, round, and reactive to light.     Comments: Mild pallor of conjunctiva  Cardiovascular:     Rate and Rhythm: Normal rate and regular rhythm.     Heart sounds: Normal heart sounds.  Pulmonary:     Effort: Pulmonary effort is normal.     Breath sounds: Normal breath sounds.  Abdominal:     General: Bowel sounds are normal.     Palpations: Abdomen is soft.  Musculoskeletal:        General: No tenderness or deformity. Normal range of motion.     Cervical back: Normal range of motion.  Lymphadenopathy:     Cervical: No cervical adenopathy.  Skin:    General: Skin is warm and dry.     Findings: No erythema or rash.  Neurological:     Mental Status: She is alert and oriented to person, place, and time.  Psychiatric:        Behavior: Behavior normal.        Thought Content: Thought content normal.        Judgment: Judgment normal.     Lab Results  Component Value Date   WBC 2.9 (L) 11/26/2023   HGB 11.5 (L) 11/26/2023   HCT 34.6 (L) 11/26/2023   MCV 91.8 11/26/2023   PLT 178 11/26/2023   Lab Results  Component Value Date   FERRITIN 364.6 (H) 09/16/2023   IRON 51 08/13/2023   TIBC 329 08/13/2023   UIBC 278 08/13/2023   IRONPCTSAT 16 08/13/2023   Lab Results  Component Value Date    RETICCTPCT 1.2 11/27/2022   RBC 3.77 (L) 11/26/2023   RETICCTABS 32,640 07/16/2016   Lab Results  Component Value Date   KPAFRELGTCHN 9.3 10/15/2023   LAMBDASER 2.3 (L) 10/15/2023   KAPLAMBRATIO 4.04 (H) 10/15/2023   Lab Results  Component Value Date   IGGSERUM 1,279 10/15/2023   IGGSERUM 1,300 10/15/2023   IGA 37 (L) 10/15/2023   IGA 39 (L) 10/15/2023   IGMSERUM 30 10/15/2023   IGMSERUM 28 10/15/2023   Lab Results  Component Value Date   TOTALPROTELP 6.5 10/15/2023   ALBUMINELP 3.4 10/15/2023   A1GS 0.2 10/15/2023   A2GS 0.8 10/15/2023   BETS 0.9 10/15/2023   BETA2SER 0.5 07/16/2016   GAMS 1.1 10/15/2023   MSPIKE 0.8 (H) 10/15/2023   SPEI Comment 10/27/2022     Chemistry      Component Value Date/Time   NA 142 11/18/2023 1318   NA 143 11/08/2017 1127   NA 140 12/31/2016 1000   K 4.2 11/18/2023 1318   K 4.0 11/08/2017 1127   K 3.6 12/31/2016 1000   CL 108 11/18/2023 1318   CL 107 11/08/2017 1127   CO2 25 11/18/2023 1318   CO2 25 11/08/2017 1127   CO2 24 12/31/2016 1000   BUN 20 11/18/2023 1318   BUN 19 11/08/2017 1127   BUN 22.3 12/31/2016 1000   CREATININE 0.77 11/18/2023 1318   CREATININE 0.67 11/12/2023 0825   CREATININE 0.9 11/08/2017 1127   CREATININE 0.8 12/31/2016 1000      Component Value Date/Time   CALCIUM 9.2 11/18/2023 1318   CALCIUM 9.9 11/08/2017 1127   CALCIUM 9.5 12/31/2016 1000   ALKPHOS 48 11/18/2023 1318   ALKPHOS 44 11/08/2017 1127   ALKPHOS 51 12/31/2016 1000   AST 20 11/18/2023 1318   AST 20 11/12/2023 0825   AST 28 12/31/2016 1000   ALT 14 11/18/2023 1318   ALT 13 11/12/2023 0825   ALT 36 11/08/2017 1127   ALT 16 12/31/2016 1000   BILITOT 0.5 11/18/2023 1318   BILITOT 0.6 11/12/2023 0825   BILITOT 0.46 12/31/2016 1000       Impression and Plan: Ms. Hetzel is a very pleasant 69 yo African American female with a previous IgG kappa MGUS with progression to myeloma.  Again, she has responded very nicely to treatment.  She  has had 4 cycles of treatment.  We will go ahead and go with her fifth cycle.  I really do not think we have to do a bone marrow biopsy on her.  Have I will try to make an adjustment with her protocol at this point.  Will see about doing treatment every other week.  Will have to make an adjustment with her protocol to account for her colonoscopy.  Again, I know she will have diarrhea on occasion.  I know that Gastroenterology is following this.  We will plan to see her back in another month or so.  Josph Macho, MD 1/17/20258:39 AM

## 2023-11-26 NOTE — Telephone Encounter (Signed)
Per [facility]: Tommye Standard, CMA; Santina Evans Looks like its being drop shipped. Patient should receive it in a few days.

## 2023-11-26 NOTE — Patient Instructions (Signed)

## 2023-11-26 NOTE — Telephone Encounter (Signed)
Reached out to patient and made her aware of positive C diff results and that a prescription was sent in for it.

## 2023-11-28 LAB — IGG, IGA, IGM
IgA: 31 mg/dL — ABNORMAL LOW (ref 87–352)
IgG (Immunoglobin G), Serum: 1031 mg/dL (ref 586–1602)
IgM (Immunoglobulin M), Srm: 22 mg/dL — ABNORMAL LOW (ref 26–217)

## 2023-11-29 ENCOUNTER — Ambulatory Visit: Payer: 59 | Admitting: Family Medicine

## 2023-11-29 ENCOUNTER — Ambulatory Visit: Payer: Self-pay

## 2023-11-29 LAB — KAPPA/LAMBDA LIGHT CHAINS
Kappa free light chain: 6.7 mg/L (ref 3.3–19.4)
Kappa, lambda light chain ratio: >4.47 — ABNORMAL HIGH (ref 0.26–1.65)
Lambda free light chains: <1.5 mg/L — ABNORMAL LOW (ref 5.7–26.3)

## 2023-11-29 NOTE — Patient Outreach (Signed)
  Care Coordination   Initial Visit Note   11/29/2023 Name: Tami [name] MRN: [id number] DOB: 07/01/1956  Tami [name] is a 68 y.o. year old female who sees [name], MD for primary care. I spoke with  Tami [name] by phone today.  What matters to the patients health and wellness today?  Patient needs information on Transportation and personal care services.    Goals Addressed             This Visit's Progress    Care Coordination Activities       Interventions Today    Flowsheet Row Most Recent Value  Chronic Disease   Chronic disease during today's visit Diabetes, Congestive Heart Failure (CHF), Other  [Stroke, cancer]  General Interventions   General Interventions Discussed/Reviewed General Interventions Discussed, General Interventions Reviewed, Community Resources, Level of Care  [Pt has Chemo weekly and appt is [place].Pt has a car but doesn't feel well enough to drive.Pt has access to [facility] and [employer].Pt agreed to contact Medicaid to set up transportation services.]  Level of Care Personal Care Services  [Pt request assistance in the home. SW completes PCS referral and submits to provider for review.]  Education Interventions   Education Provided Provided Education  [SW provides education on PCS process and Medicaid Transportation.]              SDOH assessments and interventions completed:  Yes  SDOH Interventions Today    Flowsheet Row Most Recent Value  SDOH Interventions   Food Insecurity Interventions Intervention Not Indicated, Other (Comment)  [Foodstamps and [employer]]  Housing Interventions Intervention Not Indicated  Transportation Interventions Intervention Not Indicated, Other (Comment)  [Has Medicaid/UHC transportation and a car]  Utilities Interventions Intervention Not Indicated        Care Coordination Interventions:  Yes, provided   Follow up plan: Follow up call scheduled for 12/27/23 at 10am.    Encounter Outcome:   Patient Visit Completed

## 2023-11-29 NOTE — Patient Instructions (Signed)
Visit Information  Thank you for taking time to visit with me today. Please don't hesitate to contact me if I can be of assistance to you.   Following are the goals we discussed today:  Patient will contact Medicaid Transportation to set up services. SW will submit PCS referral to provider.   Our next appointment is by telephone on 12/27/23 at 10am  Please call the care guide team at [phone] if you need to cancel or reschedule your appointment.   If you are experiencing a Mental Health or Behavioral Health Crisis or need someone to talk to, please call [phone]  Patient verbalizes understanding of instructions and care plan provided today and agrees to view in MyChart. Active MyChart status and patient understanding of how to access instructions and care plan via MyChart confirmed with patient.     Telephone follow up appointment with care management team member scheduled for: 12/27/23 at 1pm  Lysle Morales, BSW [place]  [facility], [facility] Social Worker Direct Dial: [phone]  Fax: [phone] Website: [email or web address]

## 2023-12-01 ENCOUNTER — Emergency Department (HOSPITAL_BASED_OUTPATIENT_CLINIC_OR_DEPARTMENT_OTHER)
Admission: EM | Admit: 2023-12-01 | Discharge: 2023-12-01 | Disposition: A | Discharge status: Home / Self Care | Payer: 59 | Attending: Emergency Medicine | Admitting: Emergency Medicine

## 2023-12-01 ENCOUNTER — Emergency Department (HOSPITAL_BASED_OUTPATIENT_CLINIC_OR_DEPARTMENT_OTHER): Payer: 59

## 2023-12-01 ENCOUNTER — Other Ambulatory Visit: Payer: Self-pay

## 2023-12-01 ENCOUNTER — Encounter (HOSPITAL_BASED_OUTPATIENT_CLINIC_OR_DEPARTMENT_OTHER): Payer: Self-pay

## 2023-12-01 ENCOUNTER — Ambulatory Visit: Payer: Self-pay | Admitting: Family Medicine

## 2023-12-01 ENCOUNTER — Other Ambulatory Visit: Payer: Self-pay | Admitting: *Deleted

## 2023-12-01 DIAGNOSIS — Z79899 Other long term (current) drug therapy: Secondary | ICD-10-CM | POA: Diagnosis not present

## 2023-12-01 DIAGNOSIS — R079 Chest pain, unspecified: Secondary | ICD-10-CM | POA: Insufficient documentation

## 2023-12-01 DIAGNOSIS — R1084 Generalized abdominal pain: Secondary | ICD-10-CM | POA: Diagnosis present

## 2023-12-01 DIAGNOSIS — D509 Iron deficiency anemia, unspecified: Secondary | ICD-10-CM | POA: Insufficient documentation

## 2023-12-01 DIAGNOSIS — I509 Heart failure, unspecified: Secondary | ICD-10-CM | POA: Insufficient documentation

## 2023-12-01 DIAGNOSIS — A0472 Enterocolitis due to Clostridium difficile, not specified as recurrent: Secondary | ICD-10-CM | POA: Diagnosis present

## 2023-12-01 DIAGNOSIS — Z7982 Long term (current) use of aspirin: Secondary | ICD-10-CM | POA: Diagnosis not present

## 2023-12-01 DIAGNOSIS — M1990 Unspecified osteoarthritis, unspecified site: Secondary | ICD-10-CM | POA: Diagnosis not present

## 2023-12-01 DIAGNOSIS — Z20822 Contact with and (suspected) exposure to covid-19: Secondary | ICD-10-CM | POA: Insufficient documentation

## 2023-12-01 DIAGNOSIS — R0789 Other chest pain: Secondary | ICD-10-CM | POA: Diagnosis not present

## 2023-12-01 DIAGNOSIS — I517 Cardiomegaly: Secondary | ICD-10-CM | POA: Insufficient documentation

## 2023-12-01 DIAGNOSIS — Z95810 Presence of automatic (implantable) cardiac defibrillator: Secondary | ICD-10-CM | POA: Diagnosis not present

## 2023-12-01 DIAGNOSIS — R197 Diarrhea, unspecified: Secondary | ICD-10-CM | POA: Diagnosis not present

## 2023-12-01 DIAGNOSIS — C9000 Multiple myeloma not having achieved remission: Secondary | ICD-10-CM | POA: Insufficient documentation

## 2023-12-01 DIAGNOSIS — R29898 Other symptoms and signs involving the musculoskeletal system: Secondary | ICD-10-CM

## 2023-12-01 LAB — URINALYSIS, MICROSCOPIC (REFLEX)

## 2023-12-01 LAB — CBC
HCT: 38.6 % (ref 36.0–46.0)
Hemoglobin: 12.7 g/dL (ref 12.0–15.0)
MCH: 29.8 pg (ref 26.0–34.0)
MCHC: 32.9 g/dL (ref 30.0–36.0)
MCV: 90.6 fL (ref 80.0–100.0)
Platelets: 83 10*3/uL — ABNORMAL LOW (ref 150–400)
RBC: 4.26 MIL/uL (ref 3.87–5.11)
RDW: 13.5 % (ref 11.5–15.5)
WBC: 3.5 10*3/uL — ABNORMAL LOW (ref 4.0–10.5)
nRBC: 0.6 % — ABNORMAL HIGH (ref 0.0–0.2)

## 2023-12-01 LAB — BASIC METABOLIC PANEL
Anion gap: 9 (ref 5–15)
BUN: 18 mg/dL (ref 8–23)
CO2: 24 mmol/L (ref 22–32)
Calcium: 9.5 mg/dL (ref 8.9–10.3)
Chloride: 106 mmol/L (ref 98–111)
Creatinine, Ser: 0.66 mg/dL (ref 0.44–1.00)
GFR, Estimated: >60 mL/min (ref >60)
Glucose, Bld: 116 mg/dL — ABNORMAL HIGH (ref 70–99)
Potassium: 3.8 mmol/L (ref 3.5–5.1)
Sodium: 139 mmol/L (ref 135–145)

## 2023-12-01 LAB — URINALYSIS, ROUTINE W REFLEX MICROSCOPIC
Bilirubin Urine: NEGATIVE
Glucose, UA: NEGATIVE mg/dL
Hgb urine dipstick: NEGATIVE
Ketones, ur: NEGATIVE mg/dL
Nitrite: NEGATIVE
Protein, ur: NEGATIVE mg/dL
Specific Gravity, Urine: 1.025 (ref 1.005–1.030)
pH: 6 (ref 5.0–8.0)

## 2023-12-01 LAB — RESP PANEL BY RT-PCR (RSV, FLU A&B, COVID)  RVPGX2
Influenza A by PCR: NEGATIVE
Influenza B by PCR: NEGATIVE
Resp Syncytial Virus by PCR: NEGATIVE
SARS Coronavirus 2 by RT PCR: NEGATIVE

## 2023-12-01 LAB — TROPONIN I (HIGH SENSITIVITY)
Troponin I (High Sensitivity): 8 ng/L (ref <18)
Troponin I (High Sensitivity): 7 ng/L (ref <18)

## 2023-12-01 MED ORDER — SODIUM CHLORIDE 0.9 % IV BOLUS
500.0000 mL | Freq: Once | INTRAVENOUS | Status: AC
  Administered 2023-12-01: 500 mL via INTRAVENOUS

## 2023-12-01 NOTE — ED Provider Notes (Signed)
[facility] Provider Note   CSN: [id number] Arrival date & time: 12/01/23  1227     History  Chief Complaint  Patient presents with   Chest Pain   Diarrhea    Tami [name] is a 68 y.o. female with medical history of iron deficiency anemia, multiple myeloma, CHF, arthritis.  Patient recently found to have C. difficile on stool examination/testing.  Patient reports to ED today complaining of diarrhea, generalized abdominal pain, chest pain.  Reports that she has had persistent diarrhea since 1/7.  On chart review, it appears the patient was advised that she had C. difficile at some point in the last 1 week, 116.  The patient reports today that she was unaware that she had C. difficile.  Reports that she has been taking antibiotic, Dificid, however she was unsure why she was taking this medication.  She reports that she has had persistent diarrhea over the last few days.  She has also had nausea without vomiting.  Patient reports that yesterday she had episode of chest pain which did not radiate, was not associated shortness of breath.  States the chest pain lasted for 5 minutes and relieved.  Denies fevers at home, dysuria.  Denies lightheadedness, dizziness or weakness.  Denies leg swelling.  Also reporting 1 episode of dysuria this morning.  Denies flank pain or vaginal discharge.   Chest Pain Associated symptoms: abdominal pain and nausea   Associated symptoms: no fever, no shortness of breath and no vomiting   Diarrhea Associated symptoms: abdominal pain   Associated symptoms: no fever and no vomiting        Home Medications Prior to Admission medications   Medication Sig Start Date End Date Taking? Authorizing Provider  acetaminophen (TYLENOL) 500 MG tablet Take 500 mg by mouth every 6 (six) hours as needed.    [provider]  acyclovir (ZOVIRAX) 400 MG tablet Take 1 tablet (400 mg total) by mouth 2 (two) times daily.  04/22/23   Josph Macho, MD  albuterol (PROVENTIL) (2.5 MG/3ML) 0.083% nebulizer solution Take 3 mLs (2.5 mg total) by nebulization every 6 (six) hours as needed for wheezing or shortness of breath. 12/15/22   [name], MD  albuterol (VENTOLIN HFA) 108 (90 Base) MCG/ACT inhaler Inhale 2 puffs into the lungs every 6 (six) hours as needed for wheezing or shortness of breath. 12/07/22   [name], MD  ALPRAZolam Prudy Feeler) 0.25 MG tablet Take 1-2 tablets (0.25-0.5 mg total) by mouth 3 (three) times daily as needed for anxiety. 06/14/23   [name], MD  amLODipine (NORVASC) 5 MG tablet Take 5 mg by mouth daily. 09/14/23   [provider]  aspirin EC 81 MG tablet Take 81 mg by mouth daily. Swallow whole.    [provider]  budesonide (PULMICORT) 0.5 MG/2ML nebulizer solution Take 2 mLs (0.5 mg total) by nebulization 2 (two) times daily as needed (cough, wheezing or shortness of breath). 12/31/22   Martina Sinner, MD  celecoxib (CELEBREX) 100 MG capsule Take 100 mg by mouth 2 (two) times daily. 06/07/23   [provider]  cetirizine (ZYRTEC) 10 MG tablet Take 10 mg by mouth at bedtime.    [provider]  Cholecalciferol (VITAMIN D3) 50 MCG (2000 UT) TABS Take 2,000 Units by mouth in the morning.    [provider]  cholestyramine light (PREVALITE) 4 g packet Take 4 g by mouth 2 (two) times  daily.    [provider]  dicyclomine (BENTYL) 10 MG capsule Take 1 capsule (10 mg total) by mouth 4 (four) times daily -  before meals and at bedtime. cramping 09/09/23 12/08/23  Judyann Munson, MD  diphenoxylate-atropine (LOMOTIL) 2.5-0.025 MG tablet Take 1 tablet by mouth 2 (two) times daily. 05/19/23   Lynann Bologna, MD  famotidine (PEPCID) 20 MG tablet Take 1 tablet (20 mg total) by mouth at bedtime. 09/16/23   [name], MD  fidaxomicin (DIFICID) 200 MG TABS tablet Take 1 tablet (200 mg total) by mouth 2 (two) times daily. 11/25/23   Donato Schultz, DO  fluconazole (DIFLUCAN) 150 MG tablet Take 1 tablet today as directed; repeat after 72 hours 10/29/23   Olive Bass, FNP  fluticasone [facility]) 50 MCG/ACT nasal spray Place 2 sprays into both nostrils daily. 01/07/20   Joy, Shawn C, PA-C  furosemide (LASIX) 20 MG tablet Take 20 mg by mouth daily as needed (fluid retention (feet swelling)). 07/03/20   [provider]  HYDROcodone bit-homatropine (HYDROMET) 5-1.5 MG/5ML syrup Take 5 mLs by mouth every 8 (eight) hours as needed for cough. 09/20/23   [name], MD  hydrOXYzine (ATARAX) 10 MG tablet TAKE ONE (1) TABLET BY MOUTH 3 TIMES DAILY AS NEEDED FOR ITCHING 03/03/23   [name], MD  hyoscyamine (LEVSIN SL) 0.125 MG SL tablet Place 1 tablet (0.125 mg total) under the tongue every 4 (four) hours as needed. 04/27/23   [name], MD  lidocaine-prilocaine (EMLA) cream Apply 1 Application topically as needed. 07/01/23   Josph Macho, MD  loperamide (IMODIUM) 2 MG capsule Take 4 mg by mouth in the morning and at bedtime.    [provider]  losartan (COZAAR) 50 MG tablet Take 50 mg by mouth in the morning and at bedtime. 04/14/21   [provider]  Magnesium Glycinate 100 MG CAPS Take 200 mg by mouth at bedtime. 09/21/22   [provider]  meclizine (ANTIVERT) 12.5 MG tablet Take 12.5 mg by mouth 2 (two) times daily as needed for dizziness or nausea. 01/28/22   [provider]  metoprolol succinate (TOPROL-XL) 50 MG 24 hr tablet Take 1 tablet (50 mg total) by mouth 2 (two) times daily. Patient taking differently: Take 50 mg by mouth daily. 02/23/23   [name], MD  montelukast (SINGULAIR) 10 MG tablet Take 1 tablet (10 mg total) by mouth at bedtime. Please start taking this on September 4. 07/01/23   Josph Macho, MD  Multiple Vitamins-Minerals (CENTRUM SILVER WOMEN 50+ PO) Take 1 tablet by mouth daily at 6 (six) AM.    [provider]   neomycin-polymyxin-hydrocortisone (CORTISPORIN) OTIC solution Place 4 drops into both ears 4 (four) times daily. 07/30/23   Josph Macho, MD  nortriptyline (PAMELOR) 10 MG capsule Take 1 capsule (10 mg total) by mouth at bedtime. 06/14/23   [name], MD  omeprazole (PRILOSEC) 20 MG capsule SMARTSIG:1.0 Capsule(s) By Mouth Daily 08/19/23   [provider]  ondansetron (ZOFRAN) 8 MG tablet Take 1 tablet (8 mg total) by mouth every 8 (eight) hours as needed for nausea or vomiting. 07/14/23   Josph Macho, MD  oxyCODONE-acetaminophen (PERCOCET) 5-325 MG tablet Take 2 tablets by mouth every 6 (six) hours as needed. 08/07/23   Charlynne Pander, MD  oxyCODONE-acetaminophen (PERCOCET/ROXICET) 5-325 MG tablet Take 1 tablet by mouth every 6 (six) hours as needed for severe pain.  11/07/21   [provider]  pantoprazole (PROTONIX) 40 MG tablet Take 1 tablet (40 mg total) by mouth daily. 09/03/23   Sandford Craze, NP  potassium chloride SA (KLOR-CON M) 20 MEQ tablet TAKE 1 TABLET BY MOUTH EVERY MONDAY, WEDNESDAY, AND FRIDAY. 10/25/23   [name], MD  prochlorperazine (COMPAZINE) 10 MG tablet Take 0.5 tablets (5 mg total) by mouth 2 (two) times daily as needed for nausea or vomiting. 03/16/23   [name], MD  Respiratory Therapy Supplies (PILLOW MASK/ADULT) MISC . Provide PAP supplies (expires 12 months from 09/21/2023) . 09/29/23   [provider]  Saccharomyces boulardii (FLORASTOR PO) Take 500 mg by mouth daily at 6 (six) AM.    [provider]  scopolamine (TRANSDERM-SCOP) 1 MG/3DAYS Place 1 patch (1.5 mg total) onto the skin every 3 (three) days. 07/01/23   Josph Macho, MD  sertraline (ZOLOFT) 50 MG tablet Take 1 tablet (50 mg total) by mouth daily. 06/14/23   [name], MD  spironolactone (ALDACTONE) 25 MG tablet Take 25 mg by mouth daily.    [provider]  sulfamethoxazole-trimethoprim (BACTRIM DS) 800-160 MG tablet Take 1  tablet by mouth 2 (two) times daily. 11/01/23   Olive Bass, FNP      Allergies    Benazepril, Ondansetron hcl, Codeine, and Morphine    Review of Systems   Review of Systems  Constitutional:  Negative for fever.  Respiratory:  Negative for shortness of breath.   Cardiovascular:  Positive for chest pain. Negative for leg swelling.  Gastrointestinal:  Positive for abdominal pain, diarrhea and nausea. Negative for vomiting.  All other systems reviewed and are negative.   Physical Exam Updated Vital Signs BP (!) 165/86   Pulse 82   Temp 97.8 F (36.6 C) (Oral)   Resp 16   Ht 5\' 6"  (1.676 m)   Wt 87.5 kg   SpO2 98%   BMI 31.15 kg/m  Physical Exam Vitals and nursing note reviewed.  Constitutional:      General: She is not in acute distress.    Appearance: Normal appearance. She is not ill-appearing, toxic-appearing or diaphoretic.  HENT:     Head: Normocephalic and atraumatic.     Nose: Nose normal.     Mouth/Throat:     Mouth: Mucous membranes are moist.     Pharynx: Oropharynx is clear.  Eyes:     Extraocular Movements: Extraocular movements intact.     Conjunctiva/sclera: Conjunctivae normal.     Pupils: Pupils are equal, round, and reactive to light.  Cardiovascular:     Rate and Rhythm: Normal rate and regular rhythm.  Pulmonary:     Effort: Pulmonary effort is normal.     Breath sounds: Normal breath sounds. No wheezing.  Abdominal:     General: Abdomen is flat. Bowel sounds are normal.     Palpations: Abdomen is soft.     Tenderness: There is no abdominal tenderness.     Comments: No abdominal TTP, no rebound or guarding, no CVA tenderness bilaterally  Musculoskeletal:     Cervical back: Normal range of motion and neck supple. No tenderness.     Right lower leg: No edema.     Left lower leg: No edema.  Skin:    General: Skin is warm and dry.     Capillary Refill: Capillary refill takes less than 2 seconds.  Neurological:     Mental Status: She  is alert and oriented to  person, place, and time.     ED Results / Procedures / Treatments   Labs (all labs ordered are listed, but only abnormal results are displayed) Labs Reviewed  BASIC METABOLIC PANEL - Abnormal; Notable for the following components:      Result Value   Glucose, Bld 116 (*)    All other components within normal limits  CBC - Abnormal; Notable for the following components:   WBC 3.5 (*)    Platelets 83 (*)    nRBC 0.6 (*)    All other components within normal limits  URINALYSIS, ROUTINE W REFLEX MICROSCOPIC - Abnormal; Notable for the following components:   Leukocytes,Ua TRACE (*)    All other components within normal limits  URINALYSIS, MICROSCOPIC (REFLEX) - Abnormal; Notable for the following components:   Bacteria, UA FEW (*)    All other components within normal limits  RESP PANEL BY RT-PCR (RSV, FLU A&B, COVID)  RVPGX2  TROPONIN I (HIGH SENSITIVITY)  TROPONIN I (HIGH SENSITIVITY)    EKG EKG Interpretation Date/Time:  Wednesday December 01 2023 12:37:10 EST Ventricular Rate:  93 PR Interval:  135 QRS Duration:  148 QT Interval:  429 QTC Calculation: 537 R Axis:   -46  Text Interpretation: Sinus rhythm Probable left atrial enlargement Nonspecific IVCD with LAD Probable lateral infarct, age indeterminate No significant change since last tracing Confirmed by Richardean Canal [phone]) on 12/01/2023 3:05:53 PM  Radiology DG Chest 2 View Result Date: 12/01/2023 CLINICAL DATA:  One day history of intermittent chest pain EXAM: CHEST - 2 VIEW COMPARISON:  Chest radiograph dated 09/18/2023 FINDINGS: Lines/tubes: Left chest wall ICD leads project over the right atrium and ventricle and tributary of the coronary sinus. Right chest wall port tip projects over the superior cavoatrial junction. Lungs: Well inflated lungs. No focal consolidation. Pleura: No pneumothorax or pleural effusion. Heart/mediastinum: Similar mildly enlarged cardiomediastinal silhouette. Bones:  No acute osseous abnormality. Right upper quadrant surgical clips. IMPRESSION: 1. No active cardiopulmonary disease. 2. Similar mild cardiomegaly. Electronically Signed   By: Agustin Cree M.D.   On: 12/01/2023 13:48    Procedures Procedures   Medications Ordered in ED Medications  sodium chloride 0.9 % bolus 500 mL (0 mLs Intravenous Stopped 12/01/23 1622)    ED Course/ Medical Decision Making/ A&P  Medical Decision Making Amount and/or Complexity of Data Reviewed Labs: ordered. Radiology: ordered.   68 year old female presents for evaluation.  Please see HPI for further details.  On examination patient is afebrile and nontachycardic.  Her lung sounds are clear bilaterally and she is not hypoxic.  Abdomen is soft and compressible throughout with no tenderness noted.  No rebound or guarding.  No CVA tenderness bilaterally.  Neurological examination is at baseline.  Overall nontoxic in appearance.  No acute distress.  Patient CBC with no leukocytosis, no anemia.  Metabolic panel with no electrolyte derangement, glucose 116.  Urinalysis shows trace leukocytes, will culture patient urine.  She endorses 1 episode of dysuria this morning but denies any flank pain, lower abdominal pain.  Patient troponin 8, 7 with delta.  EKG nonischemic.  Viral panel negative for all.  Chest x-ray unremarkable.  At this time, patient made aware that she does have C. difficile.  Patient encouraged to continue taking antibiotics.  Patient cardiac workup was unremarkable and reassuring.  She denies any current chest pain or shortness of breath.  She is not hypoxic.  At this time patient reports she has follow-up tomorrow with her PCP.  Have  encouraged patient to continue with this plan.  Have encouraged patient to continue taking antibiotic at home for C. difficile.  Encouraged patient to return to the ED with any new or worsening symptoms and she voiced understanding.  Stable to discharge home.   Final Clinical  Impression(s) / ED Diagnoses Final diagnoses:  Chest pain, unspecified type  C. difficile diarrhea    Rx / DC Orders ED Discharge Orders     None         Al Decant, PA-C 12/01/23 1631    Ernie Avena, MD 12/05/23 [phone]

## 2023-12-01 NOTE — Discharge Instructions (Addendum)
It was a pleasure taking part in your care.  As we discussed, most likely her diarrhea is a result of the fact that you have C. difficile currently.  Please continue taking antibiotics as prescribed.  Please follow-up tomorrow with your PCP, Dr. Fredirick Lathe.  Please return to the ED with any new or worsening symptoms.  As we discussed, we are culturing your urine to ensure there is no bacterial growth.  If your urine grows bacteria, pharmacist will call you in the morning.

## 2023-12-01 NOTE — ED Notes (Signed)
Lab called to lab to add urine culture to urine which was already sent to lab

## 2023-12-01 NOTE — ED Triage Notes (Signed)
Pt c/o intermittent chest pain since yesterday and diarrhea since Saturday.

## 2023-12-01 NOTE — ED Notes (Signed)
Urine cup given and pt notified that urine sample is needed

## 2023-12-01 NOTE — Telephone Encounter (Signed)
Chief Complaint: Ear pain in both ears Symptoms: Ear pain bilaterally, nausea Frequency: 3 days Disposition: [] ED /[] Urgent Care (no appt availability in office) / [x] Appointment(In office/virtual)/ []  [facility]/ [] Home Care/ [] Refused Recommended Disposition /[] [facility]/ []  Follow-up with PCP Additional Notes: Patient called stating she is experiencing ear pain in both ears and nausea. This RN attempted further assessment to attempt to rule out any cardiac episode but patient said she does not want to answer any questions. Patient asks "can I see Misty Stanley or not". Created in-office appointment for tomorrow with patient's PCP for further evaluation.   Copied from CRM [id number]. Topic: Clinical - Red Word Triage >> Dec 01, 2023 10:31 AM Almira Coaster wrote: Red Word that prompted transfer to Nurse Triage: Patient is calling due to severe pain caused by a possible ear infection, nausea. Reason for Disposition  Earache  (Exceptions: brief ear pain of < 60 minutes duration, earache occurring during air travel  Answer Assessment - Initial Assessment Questions 1. LOCATION: "Which ear is involved?"     Both 2. ONSET: "When did the ear start hurting"      3 days  Protocols used: Earache-A-AH

## 2023-12-02 ENCOUNTER — Ambulatory Visit (INDEPENDENT_AMBULATORY_CARE_PROVIDER_SITE_OTHER): Payer: 59 | Admitting: Family Medicine

## 2023-12-02 ENCOUNTER — Telehealth: Payer: Self-pay | Admitting: *Deleted

## 2023-12-02 ENCOUNTER — Telehealth: Payer: Self-pay

## 2023-12-02 DIAGNOSIS — A0472 Enterocolitis due to Clostridium difficile, not specified as recurrent: Secondary | ICD-10-CM | POA: Diagnosis not present

## 2023-12-02 DIAGNOSIS — I110 Hypertensive heart disease with heart failure: Secondary | ICD-10-CM | POA: Diagnosis not present

## 2023-12-02 DIAGNOSIS — C9002 Multiple myeloma in relapse: Secondary | ICD-10-CM | POA: Diagnosis not present

## 2023-12-02 DIAGNOSIS — C9000 Multiple myeloma not having achieved remission: Secondary | ICD-10-CM | POA: Diagnosis not present

## 2023-12-02 DIAGNOSIS — Z8679 Personal history of other diseases of the circulatory system: Secondary | ICD-10-CM | POA: Diagnosis not present

## 2023-12-02 DIAGNOSIS — I480 Paroxysmal atrial fibrillation: Secondary | ICD-10-CM | POA: Diagnosis not present

## 2023-12-02 DIAGNOSIS — Z95810 Presence of automatic (implantable) cardiac defibrillator: Secondary | ICD-10-CM | POA: Diagnosis not present

## 2023-12-02 DIAGNOSIS — I5042 Chronic combined systolic (congestive) and diastolic (congestive) heart failure: Secondary | ICD-10-CM | POA: Diagnosis not present

## 2023-12-02 LAB — URINE CULTURE: Culture: <10000 — AB

## 2023-12-02 MED ORDER — VANCOMYCIN HCL 125 MG PO CAPS: ORAL_CAPSULE | ORAL | 0 refills | Status: AC

## 2023-12-02 MED ORDER — DICYCLOMINE HCL 10 MG PO CAPS: 10.0000 mg | ORAL_CAPSULE | Freq: Three times a day (TID) | ORAL | 1 refills | Status: DC

## 2023-12-02 MED ORDER — PROCHLORPERAZINE MALEATE 10 MG PO TABS: 5.0000 mg | ORAL_TABLET | Freq: Two times a day (BID) | ORAL | 1 refills | Status: AC | PRN

## 2023-12-02 NOTE — Transitions of Care (Post Inpatient/ED Visit) (Signed)
   12/02/2023  Name: Tami [name] MRN: [id number] DOB: [birth year]  Today's TOC FU Call Status: Today's TOC FU Call Status:: Unsuccessful Call (1st Attempt) Unsuccessful Call (1st Attempt) Date: 12/02/23  Attempted to reach the patient regarding the most recent Inpatient/ED visit.  Follow Up Plan: Additional outreach attempts will be made to reach the patient to complete the Transitions of Care (Post Inpatient/ED visit) call.   Signature Karena Addison, LPN [facility] Nurse Health Advisor Direct Dial [phone]

## 2023-12-02 NOTE — Telephone Encounter (Signed)
Patient had forgot to check with you to see if it was ok to use Kaopectate suspension for diarrhea.  She has been using immodium with no relief.

## 2023-12-02 NOTE — Patient Instructions (Addendum)
C. Diff Infection C. diffinfection, or C. diff, is an infection that is caused by C. diff germs. These germs, called bacteria, cause watery poop (diarrhea) and very bad inflammation in the colon. This infection often happens after taking antibiotics. C. diffcan spread easily to others. What are the causes? Taking certain antibiotics. Coming in contact with people, food, or things that have the germ on it. What increases the risk? Taking certain antibiotics for a long time. Staying in a hospital or nursing home for a long time. Being age 70 or older. Having had C. diff infection before or been exposed to C. diff. Having a weak disease-fighting, or immune, system. Having serious health problems, including: Colon cancer. Inflammatory bowel disease (IBD). Taking medicines that treat stomach acid. Having had surgery on your digestive system. What are the signs or symptoms? Watery poop. Fever. Feeling like you may vomit. Not feeling hungry. Swelling, pain, cramps, or a tender belly. How is this treated? Treatment may include: Stopping the antibiotics that caused the C. diff infection. Taking antibiotics that kill C. diff. Placing poop from a healthy person into your colon. This is called a fecal transplant. Having surgery to take out the infected part of the colon. This is rare. Follow these instructions at home: Medicines Take over-the-counter and prescription medicines only as told by your doctor. Take your antibiotics as told by your doctor. Do not stop taking them even if you start to feel better. Do not take medicines that treat watery poop unless your doctor tells you to. Eating and drinking Follow instructions from your  doctor about what you may eat and drink. Eat bland foods in small amounts, such as: Bananas. Applesauce. Rice. Toast. Avoid milk, caffeine, and alcohol. To prevent loss of fluid in your body, or dehydration: Drink clear fluids to keep your pee (urine) pale yellow. This includes water, ice chips, clear fruit juice with water added to it, or low-calorie sports drinks. Have a drink called an oral rehydration solution (ORS). You can buy this drink at pharmacies and retail stores. General instructions Wash your hands often with soap and water. Do this for at least 20 seconds. Take a bath or shower every day. Return to your normal activities when your doctor says that it's safe. Be sure your home is clean before you leave the hospital or clinic. Clean every day for at least a week. Keep all follow-up visits to make sure your infection is gone. How is this prevented? Wash [employer] your hands often with soap and water for at least 20 seconds. Wash your hands before you cook and after you use the bathroom. Other people should wash their hands too, especially: People who live with you. People who visit you  in a hospital or clinic. Stop germs from spreading Tell your doctor if you get watery poop while you are in the hospital or nursing home. When you visit someone in the hospital or nursing home, wear a gown, gloves, or other protection. Try to: Stay away from people who have watery poop. Use a different bathroom if you're sick and live with other people. Clean surfaces Clean surfaces that you touch every day. Use a product that has a 10% chlorine bleach solution. Be sure to: Read the product label to make sure that the product will kill the germs on your surfaces. Clean toilets and flush handles, bathtubs, sinks, doorknobs and handles, countertops, and work surfaces. If you're in the hospital, make sure the surfaces in your room are cleaned each day. Tell someone right away if body fluids  have splashed or spilled. Wash clothes and linens Wash clothes and linens using laundry soap that has chlorine bleach. Be sure to: Use powder soap instead of liquid. Clean your washing machine once a month. To do this, turn on the hot setting with only soap in it. Contact a doctor if: Your symptoms do not get better or get worse. Your symptoms go away and then come back. You have a fever. You have new symptoms. Get help right away if: Your belly is more tender or painful. Your poop is bloody. Your poop looks black. You vomit every time you eat or drink. You have signs of not having enough fluids in your body. These include: Dark yellow pee, very little pee, or no pee. Cracked lips or dry mouth. No tears when you cry. Sunken eyes. Feeling tired. Feeling weak or dizzy. This information is not intended to replace advice given to you by your health care provider. Make sure you discuss any questions you have with your health care provider. Document Revised: 01/18/2023 Document Reviewed: 01/18/2023 Elsevier Patient Education  2024 [employer].

## 2023-12-03 ENCOUNTER — Encounter: Payer: Self-pay | Admitting: Hematology & Oncology

## 2023-12-03 ENCOUNTER — Other Ambulatory Visit: Payer: 59

## 2023-12-03 DIAGNOSIS — A0472 Enterocolitis due to Clostridium difficile, not specified as recurrent: Secondary | ICD-10-CM

## 2023-12-03 LAB — CBC WITH DIFFERENTIAL/PLATELET
Basophils Absolute: 0 10*3/uL (ref 0.0–0.1)
Basophils Relative: 1.1 % (ref 0.0–3.0)
Eosinophils Absolute: 0.1 10*3/uL (ref 0.0–0.7)
Eosinophils Relative: 2 % (ref 0.0–5.0)
HCT: 41.3 % (ref 36.0–46.0)
Hemoglobin: 13.5 g/dL (ref 12.0–15.0)
Lymphocytes Relative: 28.3 % (ref 12.0–46.0)
Lymphs Abs: 0.9 10*3/uL (ref 0.7–4.0)
MCHC: 32.8 g/dL (ref 30.0–36.0)
MCV: 92.8 fL (ref 78.0–100.0)
Monocytes Absolute: 0.3 10*3/uL (ref 0.1–1.0)
Monocytes Relative: 11 % (ref 3.0–12.0)
Neutro Abs: 1.8 10*3/uL (ref 1.4–7.7)
Neutrophils Relative %: 57.6 % (ref 43.0–77.0)
Platelets: 82 10*3/uL — ABNORMAL LOW (ref 150.0–400.0)
RBC: 4.46 Mil/uL (ref 3.87–5.11)
RDW: 14.6 % (ref 11.5–15.5)
WBC: 3.1 10*3/uL — ABNORMAL LOW (ref 4.0–10.5)

## 2023-12-03 LAB — COMPREHENSIVE METABOLIC PANEL
ALT: 14 U/L (ref 0–35)
AST: 18 U/L (ref 0–37)
Albumin: 4.5 g/dL (ref 3.5–5.2)
Alkaline Phosphatase: 53 U/L (ref 39–117)
BUN: 15 mg/dL (ref 6–23)
CO2: 26 meq/L (ref 19–32)
Calcium: 9.6 mg/dL (ref 8.4–10.5)
Chloride: 106 meq/L (ref 96–112)
Creatinine, Ser: 0.52 mg/dL (ref 0.40–1.20)
GFR: 94.74 mL/min (ref >60.00)
Glucose, Bld: 102 mg/dL — ABNORMAL HIGH (ref 70–99)
Potassium: 4.3 meq/L (ref 3.5–5.1)
Sodium: 142 meq/L (ref 135–145)
Total Bilirubin: 0.5 mg/dL (ref 0.2–1.2)
Total Protein: 7.3 g/dL (ref 6.0–8.3)

## 2023-12-03 LAB — MAGNESIUM: Magnesium: 2 mg/dL (ref 1.5–2.5)

## 2023-12-03 NOTE — Progress Notes (Signed)
Specimen drop off

## 2023-12-03 NOTE — Telephone Encounter (Signed)
Left detailed message on machine that the OTC medication is ok to try.

## 2023-12-04 ENCOUNTER — Encounter: Payer: Self-pay | Admitting: Family Medicine

## 2023-12-04 NOTE — Progress Notes (Signed)
Subjective:    Patient ID: [name], female    DOB: July 22, 1956, 68 y.o.   MRN: [id number]  Chief Complaint  Patient presents with  . Follow-up    Ear pain, Nausea and Diarrhea    HPI Discussed the use of AI scribe software for clinical note transcription with the patient, who gave verbal consent to proceed.  History of Present Illness   The patient, with a history of recurrent C. diff infection, presents with ongoing diarrhea. They describe the stool as having a 'special smell' and have been to the bathroom three times today. They also report abdominal pain, described as 'burning,' and have been unable to sleep due to the discomfort. They have been taking omeprazole, famotidine, and Bentyl for the abdominal pain, but report that the pain is still present. They also report nausea, which has affected their appetite, and have vomited twice. They have been trying to manage the nausea with ginger ale and ice. They also report having fevers and feeling clammy.  In addition to the gastrointestinal symptoms, the patient also reports pressure and aching in both ears for the past couple of days. They also report having 'sour belches.' They have been using salt water spray and Flonase for the ear discomfort.        Past Medical History:  Diagnosis Date  . Abnormal SPEP 07/26/2016  . Arthritis    knees, hands  . Back pain 07/14/2016  . Bowel obstruction (HCC) 11/2014  . C. difficile diarrhea   . CHF (congestive heart failure) (HCC)   . Colitis   . Congestive heart failure (HCC) 02/24/2015   S/p pacemaker  . Depression   . Elevated sed rate 08/15/2017  . Epigastric pain 03/22/2017  . GERD (gastroesophageal reflux disease)   . Heart disease   . Hyperlipidemia   . Hypertension   . 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 Iron deficiency anemia due to chronic blood loss 05/28/2022  . Low back pain 07/14/2016  . 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  Multiple myeloma (HCC) 07/20/2018  . Multiple myeloma not having achieved remission (HCC) 07/20/2018  . Myalgia 12/31/2016  . Obstructive sleep apnea 03/31/2015  . Presence of permanent cardiac pacemaker   . SOB (shortness of breath) 04/09/2016  . Stroke (HCC)    TIAs  . UTI (urinary tract infection)     Past Surgical History:  Procedure Laterality Date  . ABDOMINAL HYSTERECTOMY     menorraghia, 2006, total  . BIOPSY  09/01/2022   Procedure: BIOPSY;  Surgeon: Lynann Bologna, MD;  Location: [facility];  Service: Gastroenterology;;  . [place]    . COLONOSCOPY  2018   [facility] per patient  . COLONOSCOPY WITH PROPOFOL N/A 09/01/2022   Procedure: COLONOSCOPY WITH PROPOFOL;  Surgeon: Lynann Bologna, MD;  Location: [facility];  Service: Gastroenterology;  Laterality: N/A;  . ESOPHAGOGASTRODUODENOSCOPY  2018   [facility]   . ESOPHAGOGASTRODUODENOSCOPY (EGD) WITH PROPOFOL N/A 09/01/2022   Procedure: ESOPHAGOGASTRODUODENOSCOPY (EGD) WITH PROPOFOL;  Surgeon: Lynann Bologna, MD;  Location: [facility];  Service: Gastroenterology;  Laterality: N/A;  . IR IMAGING GUIDED PORT INSERTION  06/29/2023  . KNEE SURGERY     right, repair torn torn cartialage  . PACEMAKER INSERTION  08/2014   changed last in 2023 per pt at [facility]  . TONSILLECTOMY    . TUBAL LIGATION      Family History  Problem Relation Age of Onset  . Diabetes Mother   . Hypertension  Mother   . Heart disease Mother        s/p 1 stent  . Hyperlipidemia Mother   . Arthritis Mother   . Cancer Father        COLON  . Colon cancer Father 13  . Irritable bowel syndrome Sister   . Hyperlipidemia Daughter   . Hypertension Daughter   . Hypertension Maternal Grandmother   . Arthritis Maternal Grandmother   . Heart disease Maternal Grandfather        MI  . Hypertension Maternal Grandfather   . Arthritis Maternal Grandfather   . Hypertension Son   . Esophageal cancer Neg Hx     Social  History   Socioeconomic History  . Marital status: Single    Spouse name: [name]  . Number of children: 3  . Years of education: [name]  . Highest education level: High school graduate  Occupational History  . [name]  Tobacco Use  . Smoking status: Never    Passive exposure: Never  . Smokeless tobacco: Never  Vaping Use  . Vaping status: Never Used  Substance and Sexual Activity  . Alcohol use: No  . Drug use: No  . Sexual activity: Not Currently  Other Topics Concern  . [name]  Social History Narrative   Lives at home alone   Retired   Caffeine: coffee   Social Drivers of Health   Financial Resource Strain: Medium Risk (07/08/2023)   Overall Financial Resource Strain (CARDIA)   . Difficulty of Paying Living Expenses: Somewhat hard  Food Insecurity: No Food Insecurity (11/29/2023)   Hunger Vital Sign   . Worried About Programme researcher, broadcasting/film/video in the Last Year: Never true   . Ran Out of Food in the Last Year: Never true  Transportation Needs: No Transportation Needs (11/29/2023)   PRAPARE - Transportation   . Lack of Transportation (Medical): No   . Lack of Transportation (Non-Medical): No  Physical Activity: Inactive (07/08/2023)   Exercise Vital Sign   . Days of Exercise per Week: 0 days   . Minutes of Exercise per Session: 0 min  Stress: No Stress Concern Present (01/21/2023)   [employer] of Occupational Health - Occupational Stress Questionnaire   . Feeling of Stress : Not at all  Social Connections: Moderately Isolated (07/08/2023)   Social Connection and Isolation Panel [NHANES]   . Frequency of Communication with Friends and Family: More than three times a week   . Frequency of Social Gatherings with Friends and Family: Twice a week   . Attends Religious Services: More than 4 times per year   . Active Member of Clubs or Organizations: No   . Attends Banker Meetings: Never   . Marital Status: Divorced  Catering manager  Violence: Not At Risk (11/29/2023)   Humiliation, Afraid, Rape, and Kick questionnaire   . Fear of Current or Ex-Partner: No   . Emotionally Abused: No   . Physically Abused: No   . Sexually Abused: No    Outpatient Medications Prior to Visit  Medication Sig Dispense Refill  . acetaminophen (TYLENOL) 500 MG tablet Take 500 mg by mouth every 6 (six) hours as needed.    Marland Kitchen acyclovir (ZOVIRAX) 400 MG tablet Take 1 tablet (400 mg total) by mouth 2 (two) times daily. 60 tablet 3  . albuterol (PROVENTIL) (2.5 MG/3ML) 0.083% nebulizer solution Take 3 mLs (2.5 mg total) by nebulization every 6 (six) hours as needed  for wheezing or shortness of breath. 150 mL 2  . albuterol (VENTOLIN HFA) 108 (90 Base) MCG/ACT inhaler Inhale 2 puffs into the lungs every 6 (six) hours as needed for wheezing or shortness of breath. 18 g 5  . ALPRAZolam (XANAX) 0.25 MG tablet Take 1-2 tablets (0.25-0.5 mg total) by mouth 3 (three) times daily as needed for anxiety. 40 tablet 1  . amLODipine (NORVASC) 5 MG tablet Take 5 mg by mouth daily.    Marland Kitchen aspirin EC 81 MG tablet Take 81 mg by mouth daily. Swallow whole.    . budesonide (PULMICORT) 0.5 MG/2ML nebulizer solution Take 2 mLs (0.5 mg total) by nebulization 2 (two) times daily as needed (cough, wheezing or shortness of breath). 120 mL 11  . celecoxib (CELEBREX) 100 MG capsule Take 100 mg by mouth 2 (two) times daily.    . cetirizine (ZYRTEC) 10 MG tablet Take 10 mg by mouth at bedtime.    . Cholecalciferol (VITAMIN D3) 50 MCG (2000 UT) TABS Take 2,000 Units by mouth in the morning.    . cholestyramine light (PREVALITE) 4 g packet Take 4 g by mouth 2 (two) times daily.    . diphenoxylate-atropine (LOMOTIL) 2.5-0.025 MG tablet Take 1 tablet by mouth 2 (two) times daily. 60 tablet 6  . famotidine (PEPCID) 20 MG tablet Take 1 tablet (20 mg total) by mouth at bedtime. 30 tablet 2  . fidaxomicin (DIFICID) 200 MG TABS tablet Take 1 tablet (200 mg total) by mouth 2 (two) times  daily. 20 tablet 0  . fluconazole (DIFLUCAN) 150 MG tablet Take 1 tablet today as directed; repeat after 72 hours 2 tablet 0  . fluticasone (FLONASE) 50 MCG/ACT nasal spray Place 2 sprays into both nostrils daily. 16 g 0  . furosemide (LASIX) 20 MG tablet Take 20 mg by mouth daily as needed (fluid retention (feet swelling)).    Marland Kitchen HYDROcodone bit-homatropine (HYDROMET) 5-1.5 MG/5ML syrup Take 5 mLs by mouth every 8 (eight) hours as needed for cough. 120 mL 0  . hydrOXYzine (ATARAX) 10 MG tablet TAKE ONE (1) TABLET BY MOUTH 3 TIMES DAILY AS NEEDED FOR ITCHING 30 tablet 2  . hyoscyamine (LEVSIN SL) 0.125 MG SL tablet Place 1 tablet (0.125 mg total) under the tongue every 4 (four) hours as needed. 30 tablet 1  . lidocaine-prilocaine (EMLA) cream Apply 1 Application topically as needed. 30 g 0  . loperamide (IMODIUM) 2 MG capsule Take 4 mg by mouth in the morning and at bedtime.    Marland Kitchen losartan (COZAAR) 50 MG tablet Take 50 mg by mouth in the morning and at bedtime.    . Magnesium Glycinate 100 MG CAPS Take 200 mg by mouth at bedtime.    . meclizine (ANTIVERT) 12.5 MG tablet Take 12.5 mg by mouth 2 (two) times daily as needed for dizziness or nausea.    . metoprolol succinate (TOPROL-XL) 50 MG 24 hr tablet Take 1 tablet (50 mg total) by mouth 2 (two) times daily. (Patient taking differently: Take 50 mg by mouth daily.) 60 tablet 2  . montelukast (SINGULAIR) 10 MG tablet Take 1 tablet (10 mg total) by mouth at bedtime. Please start taking this on September 4. 30 tablet 3  . Multiple Vitamins-Minerals (CENTRUM SILVER WOMEN 50+ PO) Take 1 tablet by mouth daily at 6 (six) AM.    . neomycin-polymyxin-hydrocortisone (CORTISPORIN) OTIC solution Place 4 drops into both ears 4 (four) times daily. 10 mL 4  . nortriptyline (PAMELOR) 10 MG capsule  Take 1 capsule (10 mg total) by mouth at bedtime. 30 capsule 1  . omeprazole (PRILOSEC) 20 MG capsule SMARTSIG:1.0 Capsule(s) By Mouth Daily    . ondansetron (ZOFRAN) 8 MG  tablet Take 1 tablet (8 mg total) by mouth every 8 (eight) hours as needed for nausea or vomiting. 30 tablet 1  . oxyCODONE-acetaminophen (PERCOCET) 5-325 MG tablet Take 2 tablets by mouth every 6 (six) hours as needed. 10 tablet 0  . oxyCODONE-acetaminophen (PERCOCET/ROXICET) 5-325 MG tablet Take 1 tablet by mouth every 6 (six) hours as needed for severe pain.    . pantoprazole (PROTONIX) 40 MG tablet Take 1 tablet (40 mg total) by mouth daily. 30 tablet 3  . potassium chloride SA (KLOR-CON M) 20 MEQ tablet TAKE 1 TABLET BY MOUTH EVERY MONDAY, WEDNESDAY, AND FRIDAY. 30 tablet 5  . Respiratory Therapy Supplies (PILLOW MASK/ADULT) MISC . Provide PAP supplies (expires 12 months from 09/21/2023) .    Marland Kitchen Saccharomyces boulardii (FLORASTOR PO) Take 500 mg by mouth daily at 6 (six) AM.    . scopolamine (TRANSDERM-SCOP) 1 MG/3DAYS Place 1 patch (1.5 mg total) onto the skin every 3 (three) days. 10 patch 1  . sertraline (ZOLOFT) 50 MG tablet Take 1 tablet (50 mg total) by mouth daily. 30 tablet 3  . spironolactone (ALDACTONE) 25 MG tablet Take 25 mg by mouth daily.    Marland Kitchen dicyclomine (BENTYL) 10 MG capsule Take 1 capsule (10 mg total) by mouth 4 (four) times daily -  before meals and at bedtime. cramping 90 capsule 1  . prochlorperazine (COMPAZINE) 10 MG tablet Take 0.5 tablets (5 mg total) by mouth 2 (two) times daily as needed for nausea or vomiting. 60 tablet 1  . sulfamethoxazole-trimethoprim (BACTRIM DS) 800-160 MG tablet Take 1 tablet by mouth 2 (two) times daily. 10 tablet 0   Facility-Administered Medications Prior to Visit  Medication Dose Route Frequency Provider Last Rate Last Admin  . acetaminophen (TYLENOL) tablet 650 mg  650 mg Oral Once Josph Macho, MD      . nitroGLYCERIN (NITROSTAT) SL tablet 0.4 mg  0.4 mg Sublingual Once Danise Edge A, MD      . sodium chloride flush (NS) 0.9 % injection 10 mL  10 mL Intracatheter PRN Josph Macho, MD   10 mL at 09/24/23 1640    Allergies   Allergen Reactions  . Benazepril Anaphylaxis, Swelling and Hives    angioedema Throat and lip swelling  . Ondansetron Hcl Hives    Redness and hives post IV admin on 07/05/17  . Codeine Nausea And Vomiting  . Morphine Hives    Redness and hives noted post IV admin on 07/05/17    Review of Systems  Constitutional:  Positive for malaise/fatigue. Negative for fever.  HENT:  Negative for congestion.   Eyes:  Negative for blurred vision.  Respiratory:  Negative for shortness of breath.   Cardiovascular:  Negative for chest pain, palpitations and leg swelling.  Gastrointestinal:  Positive for abdominal pain, diarrhea and heartburn. Negative for blood in stool and nausea.  Genitourinary:  Negative for dysuria and frequency.  Musculoskeletal:  Negative for falls.  Skin:  Negative for rash.  Neurological:  Negative for dizziness, loss of consciousness and headaches.  Endo/Heme/Allergies:  Negative for environmental allergies.  Psychiatric/Behavioral:  Negative for depression. The patient is not nervous/anxious.        Objective:    Physical Exam Constitutional:      General: She is not in acute  distress.    Appearance: Normal appearance. She is well-developed. She is not toxic-appearing.  HENT:     Head: Normocephalic and atraumatic.     Right Ear: External ear normal.     Left Ear: External ear normal.     Nose: Nose normal.  Eyes:     General:        Right eye: No discharge.        Left eye: No discharge.     Conjunctiva/sclera: Conjunctivae normal.  Neck:     Thyroid: No thyromegaly.  Cardiovascular:     Rate and Rhythm: Normal rate and regular rhythm.     Heart sounds: Normal heart sounds. No murmur heard. Pulmonary:     Effort: Pulmonary effort is normal. No respiratory distress.     Breath sounds: Normal breath sounds.  Abdominal:     General: Bowel sounds are normal.     Palpations: Abdomen is soft.     Tenderness: There is no abdominal tenderness. There is no  guarding.  Musculoskeletal:        General: Normal range of motion.     Cervical back: Neck supple.  Lymphadenopathy:     Cervical: No cervical adenopathy.  Skin:    General: Skin is warm and dry.  Neurological:     Mental Status: She is alert and oriented to person, place, and time.  Psychiatric:        Mood and Affect: Mood normal.        Behavior: Behavior normal.        Thought Content: Thought content normal.        Judgment: Judgment normal.    BP (!) 152/98 (BP Location: Left Arm, Patient Position: Sitting, Cuff Size: Large)   Pulse 79   Temp 97.7 F (36.5 C) (Oral)   Resp 18   Ht 5\' 6"  (1.676 m)   Wt 190 lb 12.8 oz (86.5 kg)   SpO2 97%   BMI 30.80 kg/m  Wt Readings from Last 3 Encounters:  12/02/23 190 lb 12.8 oz (86.5 kg)  12/01/23 193 lb (87.5 kg)  11/26/23 193 lb (87.5 kg)    Diabetic Foot Exam - Simple   No data filed    Lab Results  Component Value Date   WBC 3.1 (L) 12/02/2023   HGB 13.5 12/02/2023   HCT 41.3 12/02/2023   PLT 82.0 (L) 12/02/2023   GLUCOSE 102 (H) 12/02/2023   CHOL 179 06/14/2023   TRIG 94.0 06/14/2023   HDL 46.90 06/14/2023   LDLCALC 113 (H) 06/14/2023   ALT 14 12/02/2023   AST 18 12/02/2023   NA 142 12/02/2023   K 4.3 12/02/2023   CL 106 12/02/2023   CREATININE 0.52 12/02/2023   BUN 15 12/02/2023   CO2 26 12/02/2023   TSH 1.40 09/16/2023   INR 0.90 07/28/2018   HGBA1C 5.5 09/16/2023    Lab Results  Component Value Date   TSH 1.40 09/16/2023   Lab Results  Component Value Date   WBC 3.1 (L) 12/02/2023   HGB 13.5 12/02/2023   HCT 41.3 12/02/2023   MCV 92.8 12/02/2023   PLT 82.0 (L) 12/02/2023   Lab Results  Component Value Date   NA 142 12/02/2023   K 4.3 12/02/2023   CHLORIDE 108 12/31/2016   CO2 26 12/02/2023   GLUCOSE 102 (H) 12/02/2023   BUN 15 12/02/2023   CREATININE 0.52 12/02/2023   BILITOT 0.5 12/02/2023   ALKPHOS 53 12/02/2023   AST 18  12/02/2023   ALT 14 12/02/2023   PROT 7.3 12/02/2023    ALBUMIN 4.5 12/02/2023   CALCIUM 9.6 12/02/2023   ANIONGAP 9 12/01/2023   EGFR 90.0 09/03/2023   GFR 94.74 12/02/2023   Lab Results  Component Value Date   CHOL 179 06/14/2023   Lab Results  Component Value Date   HDL 46.90 06/14/2023   Lab Results  Component Value Date   LDLCALC 113 (H) 06/14/2023   Lab Results  Component Value Date   TRIG 94.0 06/14/2023   Lab Results  Component Value Date   CHOLHDL 4 06/14/2023   Lab Results  Component Value Date   HGBA1C 5.5 09/16/2023       Assessment & Plan:  Clostridium difficile diarrhea -     Vancomycin HCl; Take 1 capsule (125 mg total) by mouth 4 (four) times daily for 10 days, THEN 1 capsule (125 mg total) in the morning, at noon, and at bedtime for 7 days, THEN 1 capsule (125 mg total) in the morning and at bedtime for 7 days, THEN 1 capsule (125 mg total) daily for 7 days.  Dispense: 82 capsule; Refill: 0 -     Ambulatory referral to Infectious Disease -     CBC with Differential/Platelet -     Comprehensive metabolic panel -     Magnesium -     AMB Referral [facility] Care Management -     Clostridium difficile culture-fecal; Future  Multiple myeloma in relapse (HCC)  Multiple myeloma not having achieved remission (HCC) -     AMB Referral [facility] Care Management  Other orders -     Dicyclomine HCl; Take 1 capsule (10 mg total) by mouth 4 (four) times daily -  before meals and at bedtime. cramping  Dispense: 90 capsule; Refill: 1 -     Prochlorperazine Maleate; Take 0.5 tablets (5 mg total) by mouth 2 (two) times daily as needed for nausea or vomiting.  Dispense: 60 tablet; Refill: 1    Assessment and Plan    Recurrent Clostridium difficile (C. diff) infection Persistent diarrhea with characteristic odor, despite recent treatment. Patient has been on Binoso and Vapromyza in the past. Recent C. diff test in November was positive. -Collect stool sample for C. diff testing. -Start Vancomycin. -Refer to Infectious Disease  specialist, Dr. Marcha Dutton.  Abdominal pain Patient reports severe abdominal pain, not relieved by current medications (Omeprazole, Famotidine). -Continue Omeprazole and Famotidine as currently prescribed. -Start Dicyclomine 10mg  as needed for abdominal pain.  Nausea Patient reports nausea, affecting appetite. -Prescribe Prochlorperazine for nausea.  Ear discomfort Patient reports pressure and aching in both ears. No signs of infection on examination. -Advise use of salt water spray and Flonase to relieve pressure.  General Health Maintenance / Followup Plans -Order CBC, metabolic panel, and magnesium due to persistent diarrhea. -Refer to case management team for assistance with activities of daily living, transportation, and community resources. -Provide note excusing patient from work for one week due to illness. -Check blood pressure at next visit. -Follow-up appointment in one week.         Danise Edge, MD

## 2023-12-05 DIAGNOSIS — R112 Nausea with vomiting, unspecified: Secondary | ICD-10-CM | POA: Diagnosis not present

## 2023-12-05 DIAGNOSIS — R109 Unspecified abdominal pain: Secondary | ICD-10-CM | POA: Diagnosis not present

## 2023-12-05 DIAGNOSIS — Z4502 Encounter for adjustment and management of automatic implantable cardiac defibrillator: Secondary | ICD-10-CM | POA: Diagnosis not present

## 2023-12-05 DIAGNOSIS — C9000 Multiple myeloma not having achieved remission: Secondary | ICD-10-CM | POA: Diagnosis not present

## 2023-12-05 DIAGNOSIS — Z8579 Personal history of other malignant neoplasms of lymphoid, hematopoietic and related tissues: Secondary | ICD-10-CM | POA: Diagnosis not present

## 2023-12-06 ENCOUNTER — Telehealth: Payer: Self-pay | Admitting: Neurology

## 2023-12-06 NOTE — Transitions of Care (Post Inpatient/ED Visit) (Signed)
   12/06/2023  Name: Tami [name] MRN: [id number] DOB: 1956/05/07  Today's TOC FU Call Status: Today's TOC FU Call Status:: Unsuccessful Call (1st Attempt) Unsuccessful Call (1st Attempt) Date: 12/02/23  Attempted to reach the patient regarding the most recent Inpatient/ED visit.  Follow Up Plan: No further outreach attempts will be made at this time. We have been unable to contact the patient. Patient already seen in office Signature Karena Addison, LPN [facility] Nurse Health Advisor Direct Dial [phone]

## 2023-12-06 NOTE — Telephone Encounter (Signed)
Copied from CRM [id number]. Topic: Clinical - Medical Advice >> Dec 06, 2023  3:11 PM Denese Killings wrote: Reason for CRM: Patient is requesting a call from [facility] regarding concerns from her emergency room visit.

## 2023-12-07 ENCOUNTER — Encounter: Payer: Self-pay | Admitting: Family Medicine

## 2023-12-07 DIAGNOSIS — E860 Dehydration: Secondary | ICD-10-CM | POA: Diagnosis not present

## 2023-12-07 DIAGNOSIS — R1084 Generalized abdominal pain: Secondary | ICD-10-CM | POA: Diagnosis not present

## 2023-12-07 DIAGNOSIS — Z20822 Contact with and (suspected) exposure to covid-19: Secondary | ICD-10-CM | POA: Diagnosis not present

## 2023-12-07 LAB — PROTEIN ELECTROPHORESIS, SERUM, WITH REFLEX
A/G Ratio: 1 (ref 0.7–1.7)
Albumin ELP: 3.3 g/dL (ref 2.9–4.4)
Alpha-1-Globulin: 0.3 g/dL (ref 0.0–0.4)
Alpha-2-Globulin: 0.9 g/dL (ref 0.4–1.0)
Beta Globulin: 1.1 g/dL (ref 0.7–1.3)
Gamma Globulin: 0.9 g/dL (ref 0.4–1.8)
Globulin, Total: 3.2 g/dL (ref 2.2–3.9)
M-Spike, %: 0.6 g/dL — ABNORMAL HIGH
SPEP Interpretation: 0
Total Protein ELP: 6.5 g/dL (ref 6.0–8.5)

## 2023-12-07 LAB — IMMUNOFIXATION REFLEX, SERUM
IgA: 32 mg/dL — ABNORMAL LOW (ref 87–352)
IgG (Immunoglobin G), Serum: 1079 mg/dL (ref 586–1602)
IgM (Immunoglobulin M), Srm: 22 mg/dL — ABNORMAL LOW (ref 26–217)

## 2023-12-07 LAB — CLOSTRIDIUM DIFFICILE CULTURE-FECAL

## 2023-12-07 NOTE — Telephone Encounter (Signed)
Spoke with pt yesterday and she stated that she was at the [facility].   They told her that her magnesium was low.  She advised them at we were waiting on stool test to come back for c-diff.  They also gave her fluids.  Would you like for [place] to schedule follow up or should we await for stool test results to come back.

## 2023-12-08 ENCOUNTER — Telehealth: Payer: Self-pay

## 2023-12-08 DIAGNOSIS — E860 Dehydration: Secondary | ICD-10-CM | POA: Diagnosis not present

## 2023-12-08 DIAGNOSIS — R1084 Generalized abdominal pain: Secondary | ICD-10-CM | POA: Diagnosis not present

## 2023-12-08 DIAGNOSIS — Z20822 Contact with and (suspected) exposure to covid-19: Secondary | ICD-10-CM | POA: Diagnosis not present

## 2023-12-08 NOTE — Telephone Encounter (Signed)
Advised via MyChart.

## 2023-12-08 NOTE — Telephone Encounter (Signed)
FYI Patient was admitted to hospital again. She had abdominal pain.  Advised her of results that c diff was negative.  Also advised to see what they advised her to do and when she should follow up.  Patient will call back after discharge.

## 2023-12-08 NOTE — Telephone Encounter (Signed)
-----   Message from Josph Macho sent at 12/07/2023  7:11 PM EST ----- Call - let her know that the myeloma level is now down to 0.6!!!  When we started it was 2.7.  God is good~!!!   Consulting civil engineer

## 2023-12-09 DIAGNOSIS — G4733 Obstructive sleep apnea (adult) (pediatric): Secondary | ICD-10-CM | POA: Diagnosis not present

## 2023-12-10 ENCOUNTER — Inpatient Hospital Stay: Payer: 59

## 2023-12-10 ENCOUNTER — Inpatient Hospital Stay (HOSPITAL_BASED_OUTPATIENT_CLINIC_OR_DEPARTMENT_OTHER): Payer: 59 | Admitting: Family

## 2023-12-10 VITALS — BP 129/69 | HR 80 | Temp 98.0°F | Resp 19 | Ht 66.0 in | Wt 187.0 lb

## 2023-12-10 DIAGNOSIS — D500 Iron deficiency anemia secondary to blood loss (chronic): Secondary | ICD-10-CM

## 2023-12-10 DIAGNOSIS — C9000 Multiple myeloma not having achieved remission: Secondary | ICD-10-CM

## 2023-12-10 DIAGNOSIS — Z5112 Encounter for antineoplastic immunotherapy: Secondary | ICD-10-CM | POA: Diagnosis not present

## 2023-12-10 DIAGNOSIS — D801 Nonfamilial hypogammaglobulinemia: Secondary | ICD-10-CM

## 2023-12-10 DIAGNOSIS — Z79899 Other long term (current) drug therapy: Secondary | ICD-10-CM | POA: Diagnosis not present

## 2023-12-10 DIAGNOSIS — Z5111 Encounter for antineoplastic chemotherapy: Secondary | ICD-10-CM | POA: Diagnosis not present

## 2023-12-10 DIAGNOSIS — C9002 Multiple myeloma in relapse: Secondary | ICD-10-CM | POA: Diagnosis not present

## 2023-12-10 LAB — CBC WITH DIFFERENTIAL (CANCER CENTER ONLY)
Abs Immature Granulocytes: 0.02 10*3/uL (ref 0.00–0.07)
Basophils Absolute: 0 10*3/uL (ref 0.0–0.1)
Basophils Relative: 1 %
Eosinophils Absolute: 0.1 10*3/uL (ref 0.0–0.5)
Eosinophils Relative: 3 %
HCT: 35.6 % — ABNORMAL LOW (ref 36.0–46.0)
Hemoglobin: 11.7 g/dL — ABNORMAL LOW (ref 12.0–15.0)
Immature Granulocytes: 1 %
Lymphocytes Relative: 19 %
Lymphs Abs: 0.6 10*3/uL — ABNORMAL LOW (ref 0.7–4.0)
MCH: 30.2 pg (ref 26.0–34.0)
MCHC: 32.9 g/dL (ref 30.0–36.0)
MCV: 91.8 fL (ref 80.0–100.0)
Monocytes Absolute: 0.3 10*3/uL (ref 0.1–1.0)
Monocytes Relative: 9 %
Neutro Abs: 2.2 10*3/uL (ref 1.7–7.7)
Neutrophils Relative %: 67 %
Platelet Count: 182 10*3/uL (ref 150–400)
RBC: 3.88 MIL/uL (ref 3.87–5.11)
RDW: 14 % (ref 11.5–15.5)
WBC Count: 3.3 10*3/uL — ABNORMAL LOW (ref 4.0–10.5)
nRBC: 0 % (ref 0.0–0.2)

## 2023-12-10 LAB — CMP (CANCER CENTER ONLY)
ALT: 11 U/L (ref 0–44)
AST: 17 U/L (ref 15–41)
Albumin: 4 g/dL (ref 3.5–5.0)
Alkaline Phosphatase: 50 U/L (ref 38–126)
Anion gap: 8 (ref 5–15)
BUN: 16 mg/dL (ref 8–23)
CO2: 25 mmol/L (ref 22–32)
Calcium: 9.1 mg/dL (ref 8.9–10.3)
Chloride: 109 mmol/L (ref 98–111)
Creatinine: 0.64 mg/dL (ref 0.44–1.00)
GFR, Estimated: >60 mL/min (ref >60)
Glucose, Bld: 109 mg/dL — ABNORMAL HIGH (ref 70–99)
Potassium: 3.5 mmol/L (ref 3.5–5.1)
Sodium: 142 mmol/L (ref 135–145)
Total Bilirubin: 0.4 mg/dL (ref 0.0–1.2)
Total Protein: 6.7 g/dL (ref 6.5–8.1)

## 2023-12-10 LAB — LACTATE DEHYDROGENASE: LDH: 190 U/L (ref 98–192)

## 2023-12-10 MED ORDER — HEPARIN SOD (PORK) LOCK FLUSH 100 UNIT/ML IV SOLN
500.0000 [IU] | Freq: Once | INTRAVENOUS | Status: AC
  Administered 2023-12-10: 500 [IU] via INTRAVENOUS

## 2023-12-10 MED ORDER — SODIUM CHLORIDE 0.9% FLUSH
10.0000 mL | Freq: Once | INTRAVENOUS | Status: AC
  Administered 2023-12-10: 10 mL via INTRAVENOUS

## 2023-12-10 NOTE — Progress Notes (Signed)
Hematology and Oncology Follow Up Visit  Tami [name] [id number] 31-Mar-1956 68 y.o. 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 She states that she was in the ED twice last week with n/v/d and abdominal pain.  C.Diff testing with PCP was negative. CT scans in ED were negative.  She was treated with IV fluids as well as Magnesium for low level of 1.8.  She is symptomatic with fatigue and abdominal discomfort.  No n/v/d at this time. Zofran has helped with n/v.  She is scheduled for colonoscopy on Monday 12/13/2023. She has her prep and will begin over the weekend.  No fever, chills, cough, rash, dizziness, SOB, chest pain, palpitations or changes in bladder habits at this time.  Appetite is down but she is trying to eat. She does feel that she is hydrating well throughout the day. Weight is 187 lbs.  She has not noticed any blood loss. No abnormal bruising, no petechiae.   ECOG Performance Status: 1 - Symptomatic but completely ambulatory  Medications:  Allergies as of 12/10/2023       Reactions   Benazepril Anaphylaxis, Swelling, Hives   angioedema Throat and lip swelling   Ondansetron Hcl Hives   Redness and hives post IV admin on 07/05/17   Codeine Nausea And Vomiting   Morphine Hives   Redness and hives noted post IV admin on 07/05/17        Medication List        Accurate as of December 10, 2023 11:28 AM. If you have any questions, ask your nurse or doctor.           acetaminophen 500 MG tablet Commonly known as: TYLENOL Take 500 mg by mouth every 6 (six) hours as needed.   acyclovir 400 MG tablet Commonly known as: ZOVIRAX Take 1 tablet (400 mg total) by mouth 2 (two) times daily.   albuterol 108 (90 Base) MCG/ACT inhaler Commonly known as: VENTOLIN HFA Inhale 2 puffs into the lungs every 6 (six) hours as needed for wheezing or shortness of breath.   albuterol (2.5 MG/3ML) 0.083% nebulizer solution Commonly known as: PROVENTIL Take 3 mLs (2.5 mg total) by nebulization every 6 (six) hours as needed for wheezing or shortness of breath.   ALPRAZolam 0.25 MG tablet Commonly known as: XANAX Take 1-2 tablets (0.25-0.5 mg total) by mouth 3 (three) times daily as needed for anxiety.   amLODipine 5 MG tablet Commonly known as: NORVASC Take 5 mg by mouth daily.   aspirin EC 81 MG tablet Take 81 mg by mouth daily. Swallow whole.   budesonide 0.5 MG/2ML nebulizer solution Commonly known as: Pulmicort Take 2 mLs (0.5 mg total) by nebulization 2 (two) times daily as needed (cough, wheezing or shortness of breath).   celecoxib 100 MG capsule Commonly known as: CELEBREX Take 100 mg by mouth 2 (two) times daily.   CENTRUM SILVER WOMEN 50+ PO Take 1 tablet by mouth daily  at 6 (six) AM.   cetirizine 10 MG tablet Commonly known as: ZYRTEC Take 10 mg by mouth at bedtime.   cholestyramine light 4 g packet Commonly known as: PREVALITE Take 4 g by mouth 2 (two) times daily.   dicyclomine 10 MG capsule Commonly known as: BENTYL Take 1 capsule (10 mg total) by mouth 4 (four) times daily -  before meals and at bedtime. cramping   diphenoxylate-atropine 2.5-0.025 MG tablet Commonly known as: Lomotil Take 1 tablet by mouth 2 (two) times daily.   famotidine 20 MG tablet Commonly known as: PEPCID Take 1 tablet (20 mg total) by mouth at bedtime.   fidaxomicin 200 MG Tabs tablet Commonly known as: DIFICID Take 1 tablet (200 mg total) by mouth 2  (two) times daily.   FLORASTOR PO Take 500 mg by mouth daily at 6 (six) AM.   fluconazole 150 MG tablet Commonly known as: Diflucan Take 1 tablet today as directed; repeat after 72 hours   fluticasone 50 MCG/ACT nasal spray Commonly known as: FLONASE Place 2 sprays into both nostrils daily.   furosemide 20 MG tablet Commonly known as: LASIX Take 20 mg by mouth daily as needed (fluid retention (feet swelling)).   HYDROcodone bit-homatropine 5-1.5 MG/5ML syrup Commonly known as: Hydromet Take 5 mLs by mouth every 8 (eight) hours as needed for cough.   hydrOXYzine 10 MG tablet Commonly known as: ATARAX TAKE ONE (1) TABLET BY MOUTH 3 TIMES DAILY AS NEEDED FOR ITCHING   hyoscyamine 0.125 MG SL tablet Commonly known as: LEVSIN SL Place 1 tablet (0.125 mg total) under the tongue every 4 (four) hours as needed.   lidocaine-prilocaine cream Commonly known as: EMLA Apply 1 Application topically as needed.   loperamide 2 MG capsule Commonly known as: IMODIUM Take 4 mg by mouth in the morning and at bedtime.   losartan 50 MG tablet Commonly known as: COZAAR Take 50 mg by mouth in the morning and at bedtime.   Magnesium Glycinate 100 MG Caps Take 200 mg by mouth at bedtime.   meclizine 12.5 MG tablet Commonly known as: ANTIVERT Take 12.5 mg by mouth 2 (two) times daily as needed for dizziness or nausea.   metoprolol succinate 50 MG 24 hr tablet Commonly known as: TOPROL-XL Take 1 tablet (50 mg total) by mouth 2 (two) times daily. What changed: when to take this   montelukast 10 MG tablet Commonly known as: Singulair Take 1 tablet (10 mg total) by mouth at bedtime. Please start taking this on September 4.   neomycin-polymyxin-hydrocortisone OTIC solution Commonly known as: CORTISPORIN Place 4 drops into both ears 4 (four) times daily.   nortriptyline 10 MG capsule Commonly known as: Pamelor Take 1 capsule (10 mg total) by mouth at bedtime.   omeprazole 20 MG  capsule Commonly known as: PRILOSEC SMARTSIG:1.0 Capsule(s) By Mouth Daily   ondansetron 8 MG tablet Commonly known as: Zofran Take 1 tablet (8 mg total) by mouth every 8 (eight) hours as needed for nausea or vomiting.   oxyCODONE-acetaminophen 5-325 MG tablet Commonly known as: PERCOCET/ROXICET Take 1 tablet by mouth every 6 (six) hours as needed for severe pain.   oxyCODONE-acetaminophen 5-325 MG tablet Commonly known as: Percocet Take 2 tablets by mouth every 6 (six) hours as needed.   pantoprazole 40 MG tablet Commonly known as: PROTONIX Take 1 tablet (40 mg total) by mouth daily.   Pillow Mask/Adult Misc . Provide PAP supplies (expires 12 months from 09/21/2023) .   potassium  chloride SA 20 MEQ tablet Commonly known as: KLOR-CON M TAKE 1 TABLET BY MOUTH EVERY MONDAY, WEDNESDAY, AND FRIDAY.   prochlorperazine 10 MG tablet Commonly known as: COMPAZINE Take 0.5 tablets (5 mg total) by mouth 2 (two) times daily as needed for nausea or vomiting.   scopolamine 1 MG/3DAYS Commonly known as: TRANSDERM-SCOP Place 1 patch (1.5 mg total) onto the skin every 3 (three) days.   sertraline 50 MG tablet Commonly known as: ZOLOFT Take 1 tablet (50 mg total) by mouth daily.   spironolactone 25 MG tablet Commonly known as: ALDACTONE Take 25 mg by mouth daily.   vancomycin 125 MG capsule Commonly known as: VANCOCIN Take 1 capsule (125 mg total) by mouth 4 (four) times daily for 10 days, THEN 1 capsule (125 mg total) in the morning, at noon, and at bedtime for 7 days, THEN 1 capsule (125 mg total) in the morning and at bedtime for 7 days, THEN 1 capsule (125 mg total) daily for 7 days. Start taking on: December 02, 2023   Vitamin D3 50 MCG (2000 UT) Tabs Take 2,000 Units by mouth in the morning.        Allergies:  Allergies  Allergen Reactions   Benazepril Anaphylaxis, Swelling and Hives    angioedema Throat and lip swelling   Ondansetron Hcl Hives    Redness and hives  post IV admin on 07/05/17   Codeine Nausea And Vomiting   Morphine Hives    Redness and hives noted post IV admin on 07/05/17    Past Medical History, Surgical history, Social history, and Family History were reviewed and updated.  Review of Systems: All other 10 point review of systems is negative.   Physical Exam:  height is 5\' 6"  (1.676 m) and weight is 187 lb (84.8 kg). Her oral temperature is 98 F (36.7 C). Her blood pressure is 129/69 and her pulse is 80. Her respiration is 19 and oxygen saturation is 99%.   Wt Readings from Last 3 Encounters:  12/10/23 187 lb (84.8 kg)  12/02/23 190 lb 12.8 oz (86.5 kg)  12/01/23 193 lb (87.5 kg)    Ocular: Sclerae unicteric, pupils equal, round and reactive to light Ear-nose-throat: Oropharynx clear, dentition fair Lymphatic: No cervical or supraclavicular adenopathy Lungs no rales or rhonchi, good excursion bilaterally Heart regular rate and rhythm, no murmur appreciated Abd soft, nontender, positive bowel sounds MSK no focal spinal tenderness, no joint edema Neuro: non-focal, well-oriented, appropriate affect Breasts: Deferred   Lab Results  Component Value Date   WBC 3.3 (L) 12/10/2023   HGB 11.7 (L) 12/10/2023   HCT 35.6 (L) 12/10/2023   MCV 91.8 12/10/2023   PLT 182 12/10/2023   Lab Results  Component Value Date   FERRITIN 166 11/26/2023   IRON 76 11/26/2023   TIBC 385 11/26/2023   UIBC 309 11/26/2023   IRONPCTSAT 20 11/26/2023   Lab Results  Component Value Date   RETICCTPCT 1.2 11/27/2022   RBC 3.88 12/10/2023   RETICCTABS 32,640 07/16/2016   Lab Results  Component Value Date   KPAFRELGTCHN 6.7 11/26/2023   LAMBDASER <1.5 (L) 11/26/2023   KAPLAMBRATIO >4.47 (H) 11/26/2023   Lab Results  Component Value Date   IGGSERUM 1,031 11/26/2023   IGGSERUM 1,079 11/26/2023   IGA 31 (L) 11/26/2023   IGA 32 (L) 11/26/2023   IGMSERUM 22 (L) 11/26/2023   IGMSERUM 22 (L) 11/26/2023   Lab Results  Component Value  Date   TOTALPROTELP 6.5 11/26/2023  ALBUMINELP 3.3 11/26/2023   A1GS 0.3 11/26/2023   A2GS 0.9 11/26/2023   BETS 1.1 11/26/2023   BETA2SER 0.5 07/16/2016   GAMS 0.9 11/26/2023   MSPIKE 0.6 (H) 11/26/2023   SPEI Comment 10/27/2022     Chemistry      Component Value Date/Time   NA 142 12/02/2023 1544   NA 143 11/08/2017 1127   NA 140 12/31/2016 1000   K 4.3 12/02/2023 1544   K 4.0 11/08/2017 1127   K 3.6 12/31/2016 1000   CL 106 12/02/2023 1544   CL 107 11/08/2017 1127   CO2 26 12/02/2023 1544   CO2 25 11/08/2017 1127   CO2 24 12/31/2016 1000   BUN 15 12/02/2023 1544   BUN 19 11/08/2017 1127   BUN 22.3 12/31/2016 1000   CREATININE 0.52 12/02/2023 1544   CREATININE 0.60 11/26/2023 0823   CREATININE 0.9 11/08/2017 1127   CREATININE 0.8 12/31/2016 1000      Component Value Date/Time   CALCIUM 9.6 12/02/2023 1544   CALCIUM 9.9 11/08/2017 1127   CALCIUM 9.5 12/31/2016 1000   ALKPHOS 53 12/02/2023 1544   ALKPHOS 44 11/08/2017 1127   ALKPHOS 51 12/31/2016 1000   AST 18 12/02/2023 1544   AST 17 11/26/2023 0823   AST 28 12/31/2016 1000   ALT 14 12/02/2023 1544   ALT 12 11/26/2023 0823   ALT 36 11/08/2017 1127   ALT 16 12/31/2016 1000   BILITOT 0.5 12/02/2023 1544   BILITOT 0.5 11/26/2023 0823   BILITOT 0.46 12/31/2016 1000       Impression and Plan: Tami [name] is a very pleasant 68 yo African American female with a previous IgG kappa MGUS with progression to myeloma.  Again, she has responded very nicely to treatment. Last M-spike was down to 0.6.  She is feeling fatigued and does not want treatment today.  We will give her a 2 week break to recuperate. She is in agreement with the plan.  She will continue to hydrate well and drink an ensure daily along with meals.   Eileen Stanford, NP 1/31/202511:28 AM

## 2023-12-10 NOTE — Patient Instructions (Signed)
 Implanted [facility] Guide An implanted port is a device that is placed under the skin. It is usually placed in the chest. The device may vary based on the need. Implanted ports can be used to give IV medicine, to take blood, or to give fluids. You may have an implanted port if: You need IV medicine that would be irritating to the small veins in your hands or arms. You need IV medicines, such as chemotherapy, for a long period of time. You need IV nutrition for a long period of time. You may have fewer limitations when using a port than you would if you used other types of long-term IVs. You will also likely be able to return to normal activities after your incision heals. An implanted port has two main parts: Reservoir. The reservoir is the part where a needle is inserted to give medicines or draw blood. The reservoir is round. After the port is placed, it appears as a small, raised area under your skin. Catheter. The catheter is a small, thin tube that connects the reservoir to a vein. Medicine that is inserted into the reservoir goes into the catheter and then into the vein. How is my port accessed? To access your port: A numbing cream may be placed on the skin over the port site. Your health care provider will put on a mask and sterile gloves. The skin over your port will be cleaned carefully with a germ-killing soap and allowed to dry. Your health care provider will gently pinch the port and insert a needle into it. Your health care provider will check for a blood return to make sure the port is in the vein and is still working (patent). If your port needs to remain accessed to get medicine continuously (constant infusion), your health care provider will place a clear bandage (dressing) over the needle site. The dressing and needle will need to be changed every week, or as told by your health care provider. What is flushing? Flushing helps keep the port working. Follow instructions from your  health care provider about how and when to flush the port. Ports are usually flushed with saline solution or a medicine called heparin. The need for flushing will depend on how the port is used: If the port is only used from time to time to give medicines or draw blood, the port may need to be flushed: Before and after medicines have been given. Before and after blood has been drawn. As part of routine maintenance. Flushing may be recommended every 4-6 weeks. If a constant infusion is running, the port may not need to be flushed. Throw away any syringes in a disposal container that is meant for sharp items (sharps container). You can buy a sharps container from a pharmacy, or you can make one by using an empty hard plastic bottle with a cover. How long will my port stay implanted? The port can stay in for as long as your health care provider thinks it is needed. When it is time for the port to come out, a surgery will be done to remove it. The surgery will be similar to the procedure that was done to put the port in. Follow these instructions at home: Caring for your port and port site Flush your port as told by your health care provider. If you need an infusion over several days, follow instructions from your health care provider about how to take care of your port site. Make sure you: Change your  dressing as told by your health care provider. Wash your hands with soap and water for at least 20 seconds before and after you change your dressing. If soap and water are not available, use alcohol-based hand sanitizer. Place any used dressings or infusion bags into a plastic bag. Throw that bag in the trash. Keep the dressing that covers the needle clean and dry. Do not get it wet. Do not use scissors or sharp objects near the infusion tubing. Keep any external tubes clamped, unless they are being used. Check your port site every day for signs of infection. Check for: Redness, swelling, or  pain. Fluid or blood. Warmth. Pus or a bad smell. Protect the skin around the port site. Avoid wearing bra straps that rub or irritate the site. Protect the skin around your port from seat belts. Place a soft pad over your chest if needed. Bathe or shower as told by your health care provider. The site may get wet as long as you are not actively receiving an infusion. General instructions  Return to your normal activities as told by your health care provider. Ask your health care provider what activities are safe for you. Carry a medical alert card or wear a medical alert bracelet at all times. This will let health care providers know that you have an implanted port in case of an emergency. Where to find more information American Cancer Society: www.cancer.org American Society of Clinical Oncology: www.cancer.net Contact a health care provider if: You have a fever or chills. You have redness, swelling, or pain at the port site. You have fluid or blood coming from your port site. Your incision feels warm to the touch. You have pus or a bad smell coming from the port site. Summary Implanted ports are usually placed in the chest for long-term IV access. Follow instructions from your health care provider about flushing the port and changing bandages (dressings). Take care of the area around your port by avoiding clothing that puts pressure on the area, and by watching for signs of infection. Protect the skin around your port from seat belts. Place a soft pad over your chest if needed. Contact a health care provider if you have a fever or you have redness, swelling, pain, fluid, or a bad smell at the port site. This information is not intended to replace advice given to you by your health care provider. Make sure you discuss any questions you have with your health care provider. Document Revised: 04/29/2021 Document Reviewed: 04/29/2021 Elsevier Patient Education  2024 [employer].

## 2023-12-10 NOTE — Addendum Note (Signed)
Addended by: Rozell Searing on: 12/10/2023 11:54 AM   Modules accepted: Orders

## 2023-12-11 DIAGNOSIS — G4733 Obstructive sleep apnea (adult) (pediatric): Secondary | ICD-10-CM | POA: Diagnosis not present

## 2023-12-12 LAB — KAPPA/LAMBDA LIGHT CHAINS
Kappa free light chain: 6.4 mg/L (ref 3.3–19.4)
Kappa, lambda light chain ratio: >4.27 — ABNORMAL HIGH (ref 0.26–1.65)
Lambda free light chains: <1.5 mg/L — ABNORMAL LOW (ref 5.7–26.3)

## 2023-12-12 LAB — IGG, IGA, IGM
IgA: 27 mg/dL — ABNORMAL LOW (ref 87–352)
IgG (Immunoglobin G), Serum: 1004 mg/dL (ref 586–1602)
IgM (Immunoglobulin M), Srm: 21 mg/dL — ABNORMAL LOW (ref 26–217)

## 2023-12-13 DIAGNOSIS — I110 Hypertensive heart disease with heart failure: Secondary | ICD-10-CM | POA: Diagnosis not present

## 2023-12-13 DIAGNOSIS — R1084 Generalized abdominal pain: Secondary | ICD-10-CM | POA: Diagnosis not present

## 2023-12-13 DIAGNOSIS — J45909 Unspecified asthma, uncomplicated: Secondary | ICD-10-CM | POA: Diagnosis not present

## 2023-12-13 DIAGNOSIS — Z9049 Acquired absence of other specified parts of digestive tract: Secondary | ICD-10-CM | POA: Diagnosis not present

## 2023-12-13 DIAGNOSIS — Z950 Presence of cardiac pacemaker: Secondary | ICD-10-CM | POA: Diagnosis not present

## 2023-12-13 DIAGNOSIS — Z7982 Long term (current) use of aspirin: Secondary | ICD-10-CM | POA: Diagnosis not present

## 2023-12-13 DIAGNOSIS — Z8673 Personal history of transient ischemic attack (TIA), and cerebral infarction without residual deficits: Secondary | ICD-10-CM | POA: Diagnosis not present

## 2023-12-13 DIAGNOSIS — Z79899 Other long term (current) drug therapy: Secondary | ICD-10-CM | POA: Diagnosis not present

## 2023-12-13 DIAGNOSIS — K529 Noninfective gastroenteritis and colitis, unspecified: Secondary | ICD-10-CM | POA: Diagnosis not present

## 2023-12-13 DIAGNOSIS — C9000 Multiple myeloma not having achieved remission: Secondary | ICD-10-CM | POA: Diagnosis not present

## 2023-12-13 DIAGNOSIS — Z8719 Personal history of other diseases of the digestive system: Secondary | ICD-10-CM | POA: Diagnosis not present

## 2023-12-13 DIAGNOSIS — K219 Gastro-esophageal reflux disease without esophagitis: Secondary | ICD-10-CM | POA: Diagnosis not present

## 2023-12-13 DIAGNOSIS — K644 Residual hemorrhoidal skin tags: Secondary | ICD-10-CM | POA: Diagnosis not present

## 2023-12-13 DIAGNOSIS — I447 Left bundle-branch block, unspecified: Secondary | ICD-10-CM | POA: Diagnosis not present

## 2023-12-13 DIAGNOSIS — R519 Headache, unspecified: Secondary | ICD-10-CM | POA: Diagnosis not present

## 2023-12-13 DIAGNOSIS — G8929 Other chronic pain: Secondary | ICD-10-CM | POA: Diagnosis not present

## 2023-12-13 DIAGNOSIS — R112 Nausea with vomiting, unspecified: Secondary | ICD-10-CM | POA: Diagnosis not present

## 2023-12-13 DIAGNOSIS — G4733 Obstructive sleep apnea (adult) (pediatric): Secondary | ICD-10-CM | POA: Diagnosis not present

## 2023-12-13 DIAGNOSIS — K648 Other hemorrhoids: Secondary | ICD-10-CM | POA: Diagnosis not present

## 2023-12-13 DIAGNOSIS — I7789 Other specified disorders of arteries and arterioles: Secondary | ICD-10-CM | POA: Diagnosis not present

## 2023-12-13 DIAGNOSIS — R197 Diarrhea, unspecified: Secondary | ICD-10-CM | POA: Diagnosis not present

## 2023-12-13 DIAGNOSIS — K5730 Diverticulosis of large intestine without perforation or abscess without bleeding: Secondary | ICD-10-CM | POA: Diagnosis not present

## 2023-12-13 DIAGNOSIS — I5042 Chronic combined systolic (congestive) and diastolic (congestive) heart failure: Secondary | ICD-10-CM | POA: Diagnosis not present

## 2023-12-13 DIAGNOSIS — I480 Paroxysmal atrial fibrillation: Secondary | ICD-10-CM | POA: Diagnosis not present

## 2023-12-14 ENCOUNTER — Ambulatory Visit (INDEPENDENT_AMBULATORY_CARE_PROVIDER_SITE_OTHER): Payer: 59 | Admitting: Pulmonary Disease

## 2023-12-14 ENCOUNTER — Encounter: Payer: Self-pay | Admitting: Pulmonary Disease

## 2023-12-14 DIAGNOSIS — J4520 Mild intermittent asthma, uncomplicated: Secondary | ICD-10-CM

## 2023-12-14 MED ORDER — BUDESONIDE 0.5 MG/2ML IN SUSP: 0.5000 mg | Freq: Two times a day (BID) | RESPIRATORY_TRACT | 11 refills | Status: AC | PRN

## 2023-12-14 MED ORDER — ALBUTEROL SULFATE HFA 108 (90 BASE) MCG/ACT IN AERS: 2.0000 | INHALATION_SPRAY | Freq: Four times a day (QID) | RESPIRATORY_TRACT | 5 refills | Status: AC | PRN

## 2023-12-14 MED ORDER — ALBUTEROL SULFATE (2.5 MG/3ML) 0.083% IN NEBU: 2.5000 mg | INHALATION_SOLUTION | Freq: Four times a day (QID) | RESPIRATORY_TRACT | 2 refills | Status: AC | PRN

## 2023-12-14 NOTE — Patient Instructions (Addendum)
 Continue to use budesonide  nebs twice daily as needed  Continue to use albuterol  inhaler or nebulizer treatments every 4-6 hours as needed  Continue CPAP nightly  You had biopsies of the colon to evaluate for microscopic colitis  Follow up in 1 year, call sooner if needed

## 2023-12-14 NOTE — Progress Notes (Signed)
 Synopsis: Referred in February 2024 for asthma by Harlene Horton, MD  Subjective:   [name] GENDER: female DOB: [birth year], MRN: [id number]   HPI  Chief Complaint  Patient presents with   Follow-up    Pt says cpap going well, no complaints. Recently in ED 12/07/23.    Tami [name] is a 68 year old woman, never smoker with history of OSA on CPAP, multiple myeloma, congestive heart failure, and chronic diarrhea who returns to pulmonary clinic for asthma.   Initial OV 12/31/22 She has been using albuterol  inhaler as needed 1-3 times per week. She has significant exertional dyspnea, but feels this is related to her heart failure. She is using CPAP at night for OSA. She does have seasonal allergies more so in the summer that can affect her breathing. She does have acid reflux. She is being treated for chronic diarrhea by GI. She reports intermittent episodes of severe dehydration due to the loose stools/diarrhea in which she needs IV fluids.   She is a never smoker. She lives alone. She had second hand smoke from her mother in childhood. Her mother had CHF.   Today 12/14/23 She was seen by Landry Ferrari, NP 05/03/23 for follow up and PFTs. PFTs showed mild restriction and diffusion defect.   She has been doing well from a respiratory standpoint since last visit. She is using budesonide  nebs 2-3 times per week and albuterol  inhaler or neb 2-3 times per week. She denies night time awakenings or symptoms.   She was recently in ER for diarrhea and dehydration, developed respiratory symptoms treated with nebulizer treatment and course of azithromycin .   She had EGD/Colonoscopy yesterday for on going diarrhea, nausea and vomiting. Biopsies of colon taken to rule out microscopic colitis.  She continues treatment for her multiple myeloma.  Past Medical History:  Diagnosis Date   Abnormal SPEP 07/26/2016   Arthritis    knees, hands   Back pain 07/14/2016   Bowel obstruction (HCC)  11/2014   C. 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   Multiple myeloma (HCC) 07/20/2018   Multiple myeloma not having achieved remission (HCC) 07/20/2018   Myalgia 12/31/2016   Obstructive sleep apnea 03/31/2015   Presence of permanent cardiac pacemaker    SOB (shortness of breath) 04/09/2016   Stroke (HCC)    TIAs   UTI (urinary tract infection)      Family History  Problem Relation Age of Onset   Diabetes Mother    Hypertension Mother    Heart disease Mother        s/p 1 stent   Hyperlipidemia Mother    Arthritis Mother    Cancer Father        COLON   Colon cancer Father 60   Irritable bowel syndrome Sister    Hyperlipidemia Daughter    Hypertension Daughter    Hypertension Maternal Grandmother    Arthritis Maternal Grandmother    Heart disease Maternal Grandfather  MI   Hypertension Maternal Grandfather    Arthritis Maternal Grandfather    Hypertension Son    Esophageal cancer Neg Hx      Social History   Socioeconomic History   Marital status: Single    Spouse name: [name]   Number of children: 3   Years of education: [name]   Highest education level: High school graduate  Occupational History   [name]  Tobacco Use   Smoking status: Never    Passive exposure: Never   Smokeless tobacco: Never  Vaping Use   Vaping status: Never Used  Substance and Sexual Activity   Alcohol use: No   Drug use: No   Sexual activity: Not Currently  Other Topics Concern   [name]  Social  History Narrative   Lives at home alone   Retired   Caffeine: coffee   Social Drivers of Health   Financial Resource Strain: Medium Risk (07/08/2023)   Overall Financial Resource Strain (CARDIA)    Difficulty of Paying Living Expenses: Somewhat hard  Food Insecurity: No Food Insecurity (11/29/2023)   Hunger Vital Sign    Worried About Running Out of Food in the Last Year: Never true    Ran Out of Food in the Last Year: Never true  Transportation Needs: No Transportation Needs (11/29/2023)   PRAPARE - Administrator, Civil Service (Medical): No    Lack of Transportation (Non-Medical): No  Physical Activity: Inactive (07/08/2023)   Exercise Vital Sign    Days of Exercise per Week: 0 days    Minutes of Exercise per Session: 0 min  Stress: No Stress Concern Present (01/21/2023)   [employer] of Occupational Health - Occupational Stress Questionnaire    Feeling of Stress : Not at all  Social Connections: Moderately Isolated (07/08/2023)   Social Connection and Isolation Panel [NHANES]    Frequency of Communication with Friends and Family: More than three times a week    Frequency of Social Gatherings with Friends and Family: Twice a week    Attends Religious Services: More than 4 times per year    Active Member of [employer] or Organizations: No    Attends Banker Meetings: Never    Marital Status: Divorced  Catering Manager Violence: Not At Risk (11/29/2023)   Humiliation, Afraid, Rape, and Kick questionnaire    Fear of Current or Ex-Partner: No    Emotionally Abused: No    Physically Abused: No    Sexually Abused: No     Allergies  Allergen Reactions   Benazepril  Anaphylaxis, Swelling and Hives    angioedema Throat and lip swelling   Ondansetron  Hcl Hives    Redness and hives post IV admin on 07/05/17   Codeine Nausea And Vomiting   Morphine Hives    Redness and hives noted post IV admin on 07/05/17     Outpatient Medications Prior to Visit   Medication Sig Dispense Refill   acetaminophen  (TYLENOL ) 500 MG tablet Take 500 mg by mouth every 6 (six) hours as needed.     acyclovir  (ZOVIRAX ) 400 MG tablet Take 1 tablet (400 mg total) by mouth 2 (two) times daily. 60 tablet 3   ALPRAZolam  (XANAX ) 0.25 MG tablet Take 1-2 tablets (0.25-0.5 mg total) by mouth 3 (three) times daily as needed for anxiety. 40 tablet 1   amLODipine  (NORVASC ) 5 MG tablet Take 5 mg by mouth daily.     aspirin  EC 81 MG  tablet Take 81 mg by mouth daily. Swallow whole.     celecoxib  (CELEBREX ) 100 MG capsule Take 100 mg by mouth 2 (two) times daily.     cetirizine  (ZYRTEC ) 10 MG tablet Take 10 mg by mouth at bedtime.     Cholecalciferol (VITAMIN D3) 50 MCG (2000 UT) TABS Take 2,000 Units by mouth in the morning.     cholestyramine  light (PREVALITE ) 4 g packet Take 4 g by mouth 2 (two) times daily.     dicyclomine  (BENTYL ) 10 MG capsule Take 1 capsule (10 mg total) by mouth 4 (four) times daily -  before meals and at bedtime. cramping 90 capsule 1   diphenoxylate -atropine  (LOMOTIL ) 2.5-0.025 MG tablet Take 1 tablet by mouth 2 (two) times daily. 60 tablet 6   famotidine  (PEPCID ) 20 MG tablet Take 1 tablet (20 mg total) by mouth at bedtime. 30 tablet 2   fidaxomicin  (DIFICID ) 200 MG TABS tablet Take 1 tablet (200 mg total) by mouth 2 (two) times daily. 20 tablet 0   fluconazole  (DIFLUCAN ) 150 MG tablet Take 1 tablet today as directed; repeat after 72 hours 2 tablet 0   fluticasone  (FLONASE ) 50 MCG/ACT nasal spray Place 2 sprays into both nostrils daily. 16 g 0   furosemide  (LASIX ) 20 MG tablet Take 20 mg by mouth daily as needed (fluid retention (feet swelling)).     HYDROcodone  bit-homatropine (HYDROMET) 5-1.5 MG/5ML syrup Take 5 mLs by mouth every 8 (eight) hours as needed for cough. 120 mL 0   hydrOXYzine  (ATARAX ) 10 MG tablet TAKE ONE (1) TABLET BY MOUTH 3 TIMES DAILY AS NEEDED FOR ITCHING 30 tablet 2   hyoscyamine  (LEVSIN  SL) 0.125 MG SL tablet Place 1 tablet (0.125  mg total) under the tongue every 4 (four) hours as needed. 30 tablet 1   lidocaine -prilocaine  (EMLA ) cream Apply 1 Application topically as needed. 30 g 0   loperamide  (IMODIUM ) 2 MG capsule Take 4 mg by mouth in the morning and at bedtime.     losartan  (COZAAR ) 50 MG tablet Take 50 mg by mouth in the morning and at bedtime.     Magnesium  Glycinate 100 MG CAPS Take 200 mg by mouth at bedtime.     meclizine  (ANTIVERT ) 12.5 MG tablet Take 12.5 mg by mouth 2 (two) times daily as needed for dizziness or nausea.     metoprolol  succinate (TOPROL -XL) 50 MG 24 hr tablet Take 1 tablet (50 mg total) by mouth 2 (two) times daily. (Patient taking differently: Take 50 mg by mouth daily.) 60 tablet 2   montelukast  (SINGULAIR ) 10 MG tablet Take 1 tablet (10 mg total) by mouth at bedtime. Please start taking this on September 4. 30 tablet 3   Multiple Vitamins-Minerals (CENTRUM SILVER  WOMEN 50+ PO) Take 1 tablet by mouth daily at 6 (six) AM.     neomycin -polymyxin-hydrocortisone (CORTISPORIN) OTIC solution Place 4 drops into both ears 4 (four) times daily. 10 mL 4   nortriptyline  (PAMELOR ) 10 MG capsule Take 1 capsule (10 mg total) by mouth at bedtime. 30 capsule 1   omeprazole  (PRILOSEC) 20 MG capsule SMARTSIG:1.0 Capsule(s) By Mouth Daily     ondansetron  (ZOFRAN ) 8 MG tablet Take 1 tablet (8 mg total) by mouth every 8 (eight) hours as needed for nausea or vomiting. 30 tablet 1   oxyCODONE -acetaminophen  (PERCOCET) 5-325 MG tablet Take 2 tablets by mouth every 6 (six) hours as needed. 10 tablet 0   oxyCODONE -acetaminophen  (PERCOCET/ROXICET) 5-325 MG tablet Take 1 tablet by mouth  every 6 (six) hours as needed for severe pain.     pantoprazole  (PROTONIX ) 40 MG tablet Take 1 tablet (40 mg total) by mouth daily. 30 tablet 3   potassium chloride  SA (KLOR-CON  M) 20 MEQ tablet TAKE 1 TABLET BY MOUTH EVERY MONDAY, WEDNESDAY, AND FRIDAY. 30 tablet 5   prochlorperazine  (COMPAZINE ) 10 MG tablet Take 0.5 tablets (5 mg total)  by mouth 2 (two) times daily as needed for nausea or vomiting. 60 tablet 1   Respiratory Therapy Supplies (PILLOW MASK/ADULT) MISC . Provide PAP supplies (expires 12 months from 09/21/2023) .     Saccharomyces boulardii (FLORASTOR PO) Take 500 mg by mouth daily at 6 (six) AM.     scopolamine  (TRANSDERM-SCOP) 1 MG/3DAYS Place 1 patch (1.5 mg total) onto the skin every 3 (three) days. 10 patch 1   sertraline  (ZOLOFT ) 50 MG tablet Take 1 tablet (50 mg total) by mouth daily. 30 tablet 3   spironolactone  (ALDACTONE ) 25 MG tablet Take 25 mg by mouth daily.     vancomycin  (VANCOCIN ) 125 MG capsule Take 1 capsule (125 mg total) by mouth 4 (four) times daily for 10 days, THEN 1 capsule (125 mg total) in the morning, at noon, and at bedtime for 7 days, THEN 1 capsule (125 mg total) in the morning and at bedtime for 7 days, THEN 1 capsule (125 mg total) daily for 7 days. 82 capsule 0   albuterol  (PROVENTIL ) (2.5 MG/3ML) 0.083% nebulizer solution Take 3 mLs (2.5 mg total) by nebulization every 6 (six) hours as needed for wheezing or shortness of breath. 150 mL 2   albuterol  (VENTOLIN  HFA) 108 (90 Base) MCG/ACT inhaler Inhale 2 puffs into the lungs every 6 (six) hours as needed for wheezing or shortness of breath. 18 g 5   budesonide  (PULMICORT ) 0.5 MG/2ML nebulizer solution Take 2 mLs (0.5 mg total) by nebulization 2 (two) times daily as needed (cough, wheezing or shortness of breath). 120 mL 11   Facility-Administered Medications Prior to Visit  Medication Dose Route Frequency Provider Last Rate Last Admin   acetaminophen  (TYLENOL ) tablet 650 mg  650 mg Oral Once Ennever, Peter R, MD       nitroGLYCERIN  (NITROSTAT ) SL tablet 0.4 mg  0.4 mg Sublingual Once Blyth, Stacey A, MD       sodium chloride  flush (NS) 0.9 % injection 10 mL  10 mL Intracatheter PRN Timmy Maude SAUNDERS, MD   10 mL at 09/24/23 1640    Review of Systems  Constitutional:  Negative for chills, fever, malaise/fatigue and weight loss.  HENT:   Negative for congestion, sinus pain and sore throat.   Eyes: Negative.   Respiratory:  Negative for cough, hemoptysis, sputum production, shortness of breath and wheezing.   Cardiovascular:  Negative for chest pain, palpitations, orthopnea, claudication and leg swelling.  Gastrointestinal:  Positive for abdominal pain, diarrhea and heartburn. Negative for nausea and vomiting.  Genitourinary: Negative.   Musculoskeletal:  Negative for joint pain and myalgias.  Skin:  Negative for rash.  Neurological:  Negative for weakness.  Endo/Heme/Allergies: Negative.   Psychiatric/Behavioral: Negative.      Objective:   Vitals:   12/14/23 0930  BP: 122/70  Pulse: 77  SpO2: 100%  Weight: 187 lb 6.4 oz (85 kg)  Height: 5' 6 (1.676 m)    Physical Exam Constitutional:      General: She is not in acute distress.    Appearance: She is not ill-appearing.  HENT:     Head:  Normocephalic and atraumatic.  Eyes:     General: No scleral icterus.    Conjunctiva/sclera: Conjunctivae normal.  Cardiovascular:     Rate and Rhythm: Normal rate and regular rhythm.     Pulses: Normal pulses.     Heart sounds: Normal heart sounds. No murmur heard. Pulmonary:     Effort: Pulmonary effort is normal.     Breath sounds: Normal breath sounds. No wheezing, rhonchi or rales.  Musculoskeletal:     Right lower leg: No edema.     Left lower leg: No edema.  Skin:    General: Skin is warm and dry.  Neurological:     General: No focal deficit present.     Mental Status: She is alert.     CBC    Component Value Date/Time   WBC 3.3 (L) 12/10/2023 1055   WBC 3.1 (L) 12/02/2023 1544   RBC 3.88 12/10/2023 1055   HGB 11.7 (L) 12/10/2023 1055   HGB 11.1 (L) 11/08/2017 1127   HCT 35.6 (L) 12/10/2023 1055   HCT 34.1 (L) 11/08/2017 1127   PLT 182 12/10/2023 1055   PLT 145 11/08/2017 1127   MCV 91.8 12/10/2023 1055   MCV 88 11/08/2017 1127   MCH 30.2 12/10/2023 1055   MCHC 32.9 12/10/2023 1055   RDW 14.0  12/10/2023 1055   RDW 14.6 11/08/2017 1127   LYMPHSABS 0.6 (L) 12/10/2023 1055   LYMPHSABS 0.9 11/08/2017 1127   MONOABS 0.3 12/10/2023 1055   EOSABS 0.1 12/10/2023 1055   EOSABS 0.1 11/08/2017 1127   BASOSABS 0.0 12/10/2023 1055   BASOSABS 0.0 11/08/2017 1127      Latest Ref Rng & Units 12/10/2023   10:55 AM 12/02/2023    3:44 PM 12/01/2023    1:07 PM  BMP  Glucose 70 - 99 mg/dL 890  897  883   BUN 8 - 23 mg/dL 16  15  18    Creatinine 0.44 - 1.00 mg/dL 9.35  9.47  9.33   Sodium 135 - 145 mmol/L 142  142  139   Potassium 3.5 - 5.1 mmol/L 3.5  4.3  3.8   Chloride 98 - 111 mmol/L 109  106  106   CO2 22 - 32 mmol/L 25  26  24    Calcium 8.9 - 10.3 mg/dL 9.1  9.6  9.5    Chest imaging: CXR 12/01/23 1. No active cardiopulmonary disease. 2. Similar mild cardiomegaly.  CXR 09/17/22 Multilead left-sided pacemaker in place. Stable heart size and mediastinal contours, heart upper normal in size. There is no focal airspace disease, pleural effusion or pneumothorax. Linear oriented density projecting over the posterior right seventh rib is unchanged, significance unknown. Scoliotic curvature of the spine.  PFT:    Latest Ref Rng & Units 05/03/2023   12:44 PM  PFT Results  FVC-Pre L 2.20   FVC-Predicted Pre % 66   FVC-Post L 2.22   FVC-Predicted Post % 67   Pre FEV1/FVC % % 84   Post FEV1/FCV % % 86   FEV1-Pre L 1.86   FEV1-Predicted Pre % 73   FEV1-Post L 1.92   DLCO uncorrected ml/min/mmHg 13.05   DLCO UNC% % 62   DLCO corrected ml/min/mmHg 14.05   DLCO COR %Predicted % 67   DLVA Predicted % 86   TLC L 4.30   TLC % Predicted % 80   RV % Predicted % 90     Labs:  Path:  Echo:  Heart Catheterization:  Assessment &  Plan:   Mild intermittent asthma without complication - Plan: albuterol  (PROVENTIL ) (2.5 MG/3ML) 0.083% nebulizer solution, albuterol  (VENTOLIN  HFA) 108 (90 Base) MCG/ACT inhaler, budesonide  (PULMICORT ) 0.5 MG/2ML nebulizer  solution  Discussion: Tami [name] is a 68 year old woman, never smoker with history of OSA on CPAP, multiple myeloma, congestive heart failure, and chronic diarrhea who is returns to pulmonary clinic for asthma.   Asthma, mild intermittent - continue budesonide  nebs twice daily as needed - Continue albuterol  inhaler or nebulizer treatment every 4-6 hours as needed - recent chest x-ray unremarkable  Diarrhea - following with [facility], pending biopsies to rule out microscopic colitis  Multiple Myeloma - management per oncology  Follow up in 1 year, call sooner if needed  Dorn Chill, MD [facility] Office: [phone]    Current Outpatient Medications:    acetaminophen  (TYLENOL ) 500 MG tablet, Take 500 mg by mouth every 6 (six) hours as needed., Disp: , Rfl:    acyclovir  (ZOVIRAX ) 400 MG tablet, Take 1 tablet (400 mg total) by mouth 2 (two) times daily., Disp: 60 tablet, Rfl: 3   ALPRAZolam  (XANAX ) 0.25 MG tablet, Take 1-2 tablets (0.25-0.5 mg total) by mouth 3 (three) times daily as needed for anxiety., Disp: 40 tablet, Rfl: 1   amLODipine  (NORVASC ) 5 MG tablet, Take 5 mg by mouth daily., Disp: , Rfl:    aspirin  EC 81 MG tablet, Take 81 mg by mouth daily. Swallow whole., Disp: , Rfl:    celecoxib  (CELEBREX ) 100 MG capsule, Take 100 mg by mouth 2 (two) times daily., Disp: , Rfl:    cetirizine  (ZYRTEC ) 10 MG tablet, Take 10 mg by mouth at bedtime., Disp: , Rfl:    Cholecalciferol (VITAMIN D3) 50 MCG (2000 UT) TABS, Take 2,000 Units by mouth in the morning., Disp: , Rfl:    cholestyramine  light (PREVALITE ) 4 g packet, Take 4 g by mouth 2 (two) times daily., Disp: , Rfl:    dicyclomine  (BENTYL ) 10 MG capsule, Take 1 capsule (10 mg total) by mouth 4 (four) times daily -  before meals and at bedtime. cramping, Disp: 90 capsule, Rfl: 1   diphenoxylate -atropine  (LOMOTIL ) 2.5-0.025 MG tablet, Take 1 tablet by mouth 2 (two) times daily., Disp: 60 tablet, Rfl: 6    famotidine  (PEPCID ) 20 MG tablet, Take 1 tablet (20 mg total) by mouth at bedtime., Disp: 30 tablet, Rfl: 2   fidaxomicin  (DIFICID ) 200 MG TABS tablet, Take 1 tablet (200 mg total) by mouth 2 (two) times daily., Disp: 20 tablet, Rfl: 0   fluconazole  (DIFLUCAN ) 150 MG tablet, Take 1 tablet today as directed; repeat after 72 hours, Disp: 2 tablet, Rfl: 0   fluticasone  (FLONASE ) 50 MCG/ACT nasal spray, Place 2 sprays into both nostrils daily., Disp: 16 g, Rfl: 0   furosemide  (LASIX ) 20 MG tablet, Take 20 mg by mouth daily as needed (fluid retention (feet swelling))., Disp: , Rfl:    HYDROcodone  bit-homatropine (HYDROMET) 5-1.5 MG/5ML syrup, Take 5 mLs by mouth every 8 (eight) hours as needed for cough., Disp: 120 mL, Rfl: 0   hydrOXYzine  (ATARAX ) 10 MG tablet, TAKE ONE (1) TABLET BY MOUTH 3 TIMES DAILY AS NEEDED FOR ITCHING, Disp: 30 tablet, Rfl: 2   hyoscyamine  (LEVSIN  SL) 0.125 MG SL tablet, Place 1 tablet (0.125 mg total) under the tongue every 4 (four) hours as needed., Disp: 30 tablet, Rfl: 1   lidocaine -prilocaine  (EMLA ) cream, Apply 1 Application topically as needed., Disp: 30 g, Rfl: 0  loperamide  (IMODIUM ) 2 MG capsule, Take 4 mg by mouth in the morning and at bedtime., Disp: , Rfl:    losartan  (COZAAR ) 50 MG tablet, Take 50 mg by mouth in the morning and at bedtime., Disp: , Rfl:    Magnesium  Glycinate 100 MG CAPS, Take 200 mg by mouth at bedtime., Disp: , Rfl:    meclizine  (ANTIVERT ) 12.5 MG tablet, Take 12.5 mg by mouth 2 (two) times daily as needed for dizziness or nausea., Disp: , Rfl:    metoprolol  succinate (TOPROL -XL) 50 MG 24 hr tablet, Take 1 tablet (50 mg total) by mouth 2 (two) times daily. (Patient taking differently: Take 50 mg by mouth daily.), Disp: 60 tablet, Rfl: 2   montelukast  (SINGULAIR ) 10 MG tablet, Take 1 tablet (10 mg total) by mouth at bedtime. Please start taking this on September 4., Disp: 30 tablet, Rfl: 3   Multiple Vitamins-Minerals (CENTRUM SILVER  WOMEN 50+ PO),  Take 1 tablet by mouth daily at 6 (six) AM., Disp: , Rfl:    neomycin -polymyxin-hydrocortisone (CORTISPORIN) OTIC solution, Place 4 drops into both ears 4 (four) times daily., Disp: 10 mL, Rfl: 4   nortriptyline  (PAMELOR ) 10 MG capsule, Take 1 capsule (10 mg total) by mouth at bedtime., Disp: 30 capsule, Rfl: 1   omeprazole  (PRILOSEC) 20 MG capsule, SMARTSIG:1.0 Capsule(s) By Mouth Daily, Disp: , Rfl:    ondansetron  (ZOFRAN ) 8 MG tablet, Take 1 tablet (8 mg total) by mouth every 8 (eight) hours as needed for nausea or vomiting., Disp: 30 tablet, Rfl: 1   oxyCODONE -acetaminophen  (PERCOCET) 5-325 MG tablet, Take 2 tablets by mouth every 6 (six) hours as needed., Disp: 10 tablet, Rfl: 0   oxyCODONE -acetaminophen  (PERCOCET/ROXICET) 5-325 MG tablet, Take 1 tablet by mouth every 6 (six) hours as needed for severe pain., Disp: , Rfl:    pantoprazole  (PROTONIX ) 40 MG tablet, Take 1 tablet (40 mg total) by mouth daily., Disp: 30 tablet, Rfl: 3   potassium chloride  SA (KLOR-CON  M) 20 MEQ tablet, TAKE 1 TABLET BY MOUTH EVERY MONDAY, WEDNESDAY, AND FRIDAY., Disp: 30 tablet, Rfl: 5   prochlorperazine  (COMPAZINE ) 10 MG tablet, Take 0.5 tablets (5 mg total) by mouth 2 (two) times daily as needed for nausea or vomiting., Disp: 60 tablet, Rfl: 1   Respiratory Therapy Supplies (PILLOW MASK/ADULT) MISC, . Provide PAP supplies (expires 12 months from 09/21/2023) ., Disp: , Rfl:    Saccharomyces boulardii (FLORASTOR PO), Take 500 mg by mouth daily at 6 (six) AM., Disp: , Rfl:    scopolamine  (TRANSDERM-SCOP) 1 MG/3DAYS, Place 1 patch (1.5 mg total) onto the skin every 3 (three) days., Disp: 10 patch, Rfl: 1   sertraline  (ZOLOFT ) 50 MG tablet, Take 1 tablet (50 mg total) by mouth daily., Disp: 30 tablet, Rfl: 3   spironolactone  (ALDACTONE ) 25 MG tablet, Take 25 mg by mouth daily., Disp: , Rfl:    vancomycin  (VANCOCIN ) 125 MG capsule, Take 1 capsule (125 mg total) by mouth 4 (four) times daily for 10 days, THEN 1 capsule  (125 mg total) in the morning, at noon, and at bedtime for 7 days, THEN 1 capsule (125 mg total) in the morning and at bedtime for 7 days, THEN 1 capsule (125 mg total) daily for 7 days., Disp: 82 capsule, Rfl: 0   albuterol  (PROVENTIL ) (2.5 MG/3ML) 0.083% nebulizer solution, Take 3 mLs (2.5 mg total) by nebulization every 6 (six) hours as needed for wheezing or shortness of breath., Disp: 150 mL, Rfl: 2   albuterol  (VENTOLIN   HFA) 108 (90 Base) MCG/ACT inhaler, Inhale 2 puffs into the lungs every 6 (six) hours as needed for wheezing or shortness of breath., Disp: 18 g, Rfl: 5   budesonide  (PULMICORT ) 0.5 MG/2ML nebulizer solution, Take 2 mLs (0.5 mg total) by nebulization 2 (two) times daily as needed (cough, wheezing or shortness of breath)., Disp: 120 mL, Rfl: 11  Current Facility-Administered Medications:    nitroGLYCERIN  (NITROSTAT ) SL tablet 0.4 mg, 0.4 mg, Sublingual, Once, Domenica Harlene LABOR, MD  Facility-Administered Medications Ordered in Other Visits:    acetaminophen  (TYLENOL ) tablet 650 mg, 650 mg, Oral, Once, Ennever, Maude SAUNDERS, MD   sodium chloride  flush (NS) 0.9 % injection 10 mL, 10 mL, Intracatheter, PRN, Ennever, Peter R, MD, 10 mL at 09/24/23 1640

## 2023-12-15 ENCOUNTER — Other Ambulatory Visit: Payer: Self-pay

## 2023-12-15 ENCOUNTER — Ambulatory Visit: Payer: 59 | Admitting: Internal Medicine

## 2023-12-15 ENCOUNTER — Encounter: Payer: Self-pay | Admitting: Internal Medicine

## 2023-12-15 VITALS — BP 140/87 | HR 91 | Resp 16 | Ht 66.0 in | Wt 186.0 lb

## 2023-12-15 DIAGNOSIS — K529 Noninfective gastroenteritis and colitis, unspecified: Secondary | ICD-10-CM | POA: Diagnosis not present

## 2023-12-15 DIAGNOSIS — Z8619 Personal history of other infectious and parasitic diseases: Secondary | ICD-10-CM

## 2023-12-15 DIAGNOSIS — A0471 Enterocolitis due to Clostridium difficile, recurrent: Secondary | ICD-10-CM | POA: Diagnosis not present

## 2023-12-15 DIAGNOSIS — K580 Irritable bowel syndrome with diarrhea: Secondary | ICD-10-CM

## 2023-12-15 DIAGNOSIS — C9000 Multiple myeloma not having achieved remission: Secondary | ICD-10-CM

## 2023-12-15 NOTE — Progress Notes (Signed)
 [name], female   DOB: [birth year], 68 y.o.   MRN: [id number]  HPI Rfv: chronic diarrhea  Tami [name] is a 68 year old female who presents with chronic diarrhea and recurrent c.difficile.  She has been experiencing chronic diarrhea for an extended period, leading to significant dehydration and impacting her quality of life. The diarrhea is associated with persistent stomach cramping and pain. She had no bowel movements yesterday and one this morning.  She was recently hospitalized at [facility] due to dehydration from the diarrhea. During the hospitalization, she was treated with antibiotics for a suspected C. difficile infection, but subsequent tests were negative, and the antibiotics were discontinued. No infectious disease or GI specialist was consulted during the hospitalization.  She underwent a colonoscopy and EGD on December 13, 2023, which revealed hemorrhoids and diverticular disease but no significant abnormalities. Biopsies were taken to check for microscopic colitis, and results are pending.  She is currently using Bentyl  (dicyclomine ) for stomach cramping, taking one blue capsule three times a day. She is concerned about the ongoing pain and symptoms, especially in the context of her other health issues, including chemotherapy.  No diarrhea yesterday. One loose stool yesterday    Outpatient Encounter Medications as of 12/15/2023  Medication Sig   acetaminophen  (TYLENOL ) 500 MG tablet Take 500 mg by mouth every 6 (six) hours as needed.   acyclovir  (ZOVIRAX ) 400 MG tablet Take 1 tablet (400 mg total) by mouth 2 (two) times daily.   albuterol  (PROVENTIL ) (2.5 MG/3ML) 0.083% nebulizer solution Take 3 mLs (2.5 mg total) by nebulization every 6 (six) hours as needed for wheezing or shortness of breath.   albuterol  (VENTOLIN  HFA) 108 (90 Base) MCG/ACT inhaler Inhale 2 puffs into the lungs every 6 (six) hours as needed for wheezing or shortness of  breath.   ALPRAZolam  (XANAX ) 0.25 MG tablet Take 1-2 tablets (0.25-0.5 mg total) by mouth 3 (three) times daily as needed for anxiety.   amLODipine  (NORVASC ) 5 MG tablet Take 5 mg by mouth daily.   aspirin  EC 81 MG tablet Take 81 mg by mouth daily. Swallow whole.   budesonide  (PULMICORT ) 0.5 MG/2ML nebulizer solution Take 2 mLs (0.5 mg total) by nebulization 2 (two) times daily as needed (cough, wheezing or shortness of breath).   celecoxib  (CELEBREX ) 100 MG capsule Take 100 mg by mouth 2 (two) times daily.   cetirizine  (ZYRTEC ) 10 MG tablet Take 10 mg by mouth at bedtime.   Cholecalciferol (VITAMIN D3) 50 MCG (2000 UT) TABS Take 2,000 Units by mouth in the morning.   cholestyramine  light (PREVALITE ) 4 g packet Take 4 g by mouth 2 (two) times daily.   dicyclomine  (BENTYL ) 10 MG capsule Take 1 capsule (10 mg total) by mouth 4 (four) times daily -  before meals and at bedtime. cramping   diphenoxylate -atropine  (LOMOTIL ) 2.5-0.025 MG tablet Take 1 tablet by mouth 2 (two) times daily.   famotidine  (PEPCID ) 20 MG tablet Take 1 tablet (20 mg total) by mouth at bedtime.   fidaxomicin  (DIFICID ) 200 MG TABS tablet Take 1 tablet (200 mg total) by mouth 2 (two) times daily.   fluconazole  (DIFLUCAN ) 150 MG tablet Take 1 tablet today as directed; repeat after 72 hours   fluticasone  (FLONASE ) 50 MCG/ACT nasal spray Place 2 sprays into both nostrils daily.   furosemide  (LASIX ) 20 MG tablet Take 20 mg by mouth daily as needed (fluid retention (feet swelling)).   hydrOXYzine  (ATARAX ) 10 MG tablet  TAKE ONE (1) TABLET BY MOUTH 3 TIMES DAILY AS NEEDED FOR ITCHING   hyoscyamine  (LEVSIN  SL) 0.125 MG SL tablet Place 1 tablet (0.125 mg total) under the tongue every 4 (four) hours as needed.   lidocaine -prilocaine  (EMLA ) cream Apply 1 Application topically as needed.   loperamide  (IMODIUM ) 2 MG capsule Take 4 mg by mouth in the morning and at bedtime.   losartan  (COZAAR ) 50 MG tablet Take 50 mg by mouth in the morning  and at bedtime.   Magnesium  Glycinate 100 MG CAPS Take 200 mg by mouth at bedtime.   meclizine  (ANTIVERT ) 12.5 MG tablet Take 12.5 mg by mouth 2 (two) times daily as needed for dizziness or nausea.   metoprolol  succinate (TOPROL -XL) 50 MG 24 hr tablet Take 1 tablet (50 mg total) by mouth 2 (two) times daily. (Patient taking differently: Take 50 mg by mouth daily.)   montelukast  (SINGULAIR ) 10 MG tablet Take 1 tablet (10 mg total) by mouth at bedtime. Please start taking this on September 4.   Multiple Vitamins-Minerals (CENTRUM SILVER  WOMEN 50+ PO) Take 1 tablet by mouth daily at 6 (six) AM.   neomycin -polymyxin-hydrocortisone (CORTISPORIN) OTIC solution Place 4 drops into both ears 4 (four) times daily.   nortriptyline  (PAMELOR ) 10 MG capsule Take 1 capsule (10 mg total) by mouth at bedtime.   omeprazole  (PRILOSEC) 20 MG capsule SMARTSIG:1.0 Capsule(s) By Mouth Daily   ondansetron  (ZOFRAN ) 8 MG tablet Take 1 tablet (8 mg total) by mouth every 8 (eight) hours as needed for nausea or vomiting.   oxyCODONE -acetaminophen  (PERCOCET) 5-325 MG tablet Take 2 tablets by mouth every 6 (six) hours as needed.   oxyCODONE -acetaminophen  (PERCOCET/ROXICET) 5-325 MG tablet Take 1 tablet by mouth every 6 (six) hours as needed for severe pain.   pantoprazole  (PROTONIX ) 40 MG tablet Take 1 tablet (40 mg total) by mouth daily.   potassium chloride  SA (KLOR-CON  M) 20 MEQ tablet TAKE 1 TABLET BY MOUTH EVERY MONDAY, WEDNESDAY, AND FRIDAY.   prochlorperazine  (COMPAZINE ) 10 MG tablet Take 0.5 tablets (5 mg total) by mouth 2 (two) times daily as needed for nausea or vomiting.   Respiratory Therapy Supplies (PILLOW MASK/ADULT) MISC . Provide PAP supplies (expires 12 months from 09/21/2023) .   Saccharomyces boulardii (FLORASTOR PO) Take 500 mg by mouth daily at 6 (six) AM.   scopolamine  (TRANSDERM-SCOP) 1 MG/3DAYS Place 1 patch (1.5 mg total) onto the skin every 3 (three) days.   sertraline  (ZOLOFT ) 50 MG tablet Take 1  tablet (50 mg total) by mouth daily.   spironolactone  (ALDACTONE ) 25 MG tablet Take 25 mg by mouth daily.   HYDROcodone  bit-homatropine (HYDROMET) 5-1.5 MG/5ML syrup Take 5 mLs by mouth every 8 (eight) hours as needed for cough. (Patient not taking: Reported on 12/15/2023)   vancomycin  (VANCOCIN ) 125 MG capsule Take 1 capsule (125 mg total) by mouth 4 (four) times daily for 10 days, THEN 1 capsule (125 mg total) in the morning, at noon, and at bedtime for 7 days, THEN 1 capsule (125 mg total) in the morning and at bedtime for 7 days, THEN 1 capsule (125 mg total) daily for 7 days. (Patient not taking: Reported on 12/15/2023)   Facility-Administered Encounter Medications as of 12/15/2023  Medication   acetaminophen  (TYLENOL ) tablet 650 mg   nitroGLYCERIN  (NITROSTAT ) SL tablet 0.4 mg   sodium chloride  flush (NS) 0.9 % injection 10 mL     Patient Active Problem List   Diagnosis Date Noted   Acute pain of left  shoulder 09/03/2023   Chronic combined systolic and diastolic congestive heart failure (HCC) 03/28/2023   Intractable nausea and vomiting 03/27/2023   Asthma 12/20/2022   Diarrhea    Hypocalcemia 08/25/2022   Iron  deficiency anemia due to chronic blood loss 05/28/2022   Chronic diarrhea 05/27/2022   Soft tissue lesion 04/28/2022   Obesity (BMI 30-39.9) 04/28/2022   SBO (small bowel obstruction) (HCC) 04/27/2022   Dehydration 04/03/2022   Acute vaginitis 03/18/2022   Cervical cancer screening 03/18/2022   Dizziness 12/11/2021   Visual changes 11/26/2021   Ear fullness, bilateral 08/12/2021   Bilateral knee pain 08/08/2021   History of 2019 novel coronavirus disease (COVID-19) 12/06/2020   Vitamin deficiency 07/31/2020   Acute combined systolic and diastolic heart failure (HCC) 05/09/2020   Hypomagnesemia 03/04/2020   Thrombocytopenia (HCC) 03/04/2020   Dysphagia 03/04/2020   Constipation 03/04/2020   Sensation of pressure in bladder area 01/17/2020   Hyperglycemia 01/17/2020    Peripheral neuropathy 11/15/2019   Chronic migraine without aura, with intractable migraine, so stated, with status migrainosus 10/17/2019   ETD (Eustachian tube dysfunction), bilateral 09/12/2019   Urinary frequency 07/17/2019   Headache 07/17/2019   Palpitation 05/15/2019   Clostridium difficile diarrhea 09/04/2018   Thyroid  nodule 08/26/2018   Multiple myeloma (HCC) 07/20/2018   Multiple myeloma not having achieved remission (HCC) 07/20/2018   Hematuria 07/11/2018   Seasonal allergies 06/23/2018   Right shoulder pain 06/07/2018   Neck pain 06/07/2018   Paresthesias 06/07/2018   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   Pain in thoracic spine 03/18/2018   Thoracic back pain 02/15/2018   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   Hypokalemia 08/20/2017   Anemia 08/20/2017   Elevated sed rate 08/15/2017   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   Epigastric abdominal pain 03/22/2017   Myalgia 12/31/2016   Abnormal SPEP 07/26/2016   Low back pain 07/14/2016   Insomnia 12/29/2015   Tension headache 10/21/2015   Muscle spasms of neck 10/18/2015   Dysuria 10/13/2015   AP (abdominal pain) 07/07/2015   Cardiomyopathy (HCC) 04/19/2015   Chest pain 04/19/2015   Obstructive sleep apnea 03/31/2015   Congestive heart failure (HCC) 02/24/2015   Arthritis    Hyperlipidemia    Hypertension    Depression    Stroke [facility])    GERD (gastroesophageal reflux disease)    Bowel obstruction (HCC) 11/09/2014     Health Maintenance Due  Topic Date Due   Pneumonia Vaccine 25+ Years old (4 of 4 - PPSV23 or PCV20) 06/02/2022   COVID-19 Vaccine (6 - 2024-25 season) 07/11/2023     Review of Systems 12 point ros is negative except for intermittent diarrhea Physical Exam   BP (!) 140/87   Pulse 91   Resp 16   Ht 5' 6 (1.676 m)   Wt 186 lb (84.4 kg)   BMI 30.02 kg/m   Physical Exam  Constitutional:  oriented to person, place, and time. appears well-developed and  well-nourished. No distress.  HENT: [employer]/AT, PERRLA, no scleral icterus Mouth/Throat: Oropharynx is clear and moist. No oropharyngeal exudate.  Cardiovascular: Normal rate, regular rhythm and normal heart sounds. Exam reveals no gallop and no friction rub.  No murmur heard.  Pulmonary/Chest: Effort normal and breath sounds normal. No respiratory distress.  has no wheezes.  Neck = supple, no nuchal rigidity Abdominal: Soft. Bowel sounds are normal.  exhibits no distension. There is no tenderness.  Lymphadenopathy: no cervical adenopathy. No axillary adenopathy Neurological: alert and oriented to  person, place, and time.  Skin: Skin is warm and dry. No rash noted. No erythema.  Psychiatric: a normal mood and affect.  behavior is normal.   CBC Lab Results  Component Value Date   WBC 3.3 (L) 12/10/2023   RBC 3.88 12/10/2023   HGB 11.7 (L) 12/10/2023   HCT 35.6 (L) 12/10/2023   PLT 182 12/10/2023   MCV 91.8 12/10/2023   MCH 30.2 12/10/2023   MCHC 32.9 12/10/2023   RDW 14.0 12/10/2023   LYMPHSABS 0.6 (L) 12/10/2023   MONOABS 0.3 12/10/2023   EOSABS 0.1 12/10/2023    BMET Lab Results  Component Value Date   NA 142 12/10/2023   K 3.5 12/10/2023   CL 109 12/10/2023   CO2 25 12/10/2023   GLUCOSE 109 (H) 12/10/2023   BUN 16 12/10/2023   CREATININE 0.64 12/10/2023   CALCIUM 9.1 12/10/2023   GFRNONAA >60 12/10/2023   GFRAA >60 08/10/2020      Assessment and Plan  Chronic diarrhea, including DDX--Microscopic Colitis Chronic diarrhea and abdominal pain. Recent colonoscopy with biopsies pending confirmation. Symptoms align with microscopic colitis per GI specialist's preliminary assessment. Discussed potential use of antidiarrheal medications and local steroids like budesonide  if diagnosis is confirmed. Patient expressed significant distress due to prolonged symptoms and pain. - Await biopsy results from colonoscopy - Prescribe Bentyl  (dicyclomine ) for abdominal cramping, 1 capsule  three times a day - Follow up with GI doctor for further management based on biopsy results  Recurrent Clostridioides difficile Infection (C. diff) Previous C. diff infection treated with antibiotics. Recent hospitalization for dehydration due to diarrhea. Current stool tests negative for C. diff, and vancomycin  was discontinued. Discussed complexity of managing overlapping symptoms. - Monitor for recurrence of symptoms - Ensure hydration - No further antibiotics needed at this time  General Health Maintenance Patient is undergoing chemotherapy and has multiple health issues. - Continue current chemotherapy regimen - Monitor overall health and manage symptoms as she arises  Follow-up - Call early next week to discuss biopsy results - Ensure appointment with GI doctor for further management based on biopsy results. ------------- Bentyl ,  Follow up those path results colonoscopy.

## 2023-12-16 ENCOUNTER — Telehealth: Payer: Self-pay | Admitting: Family Medicine

## 2023-12-16 ENCOUNTER — Other Ambulatory Visit: Payer: Self-pay

## 2023-12-16 NOTE — Telephone Encounter (Signed)
 Pt was referred on 12/02/23 to care management to help out with getting this.  Or pt can find where she would like to send rx to.  We have tried [facility] but they do not carry them.

## 2023-12-16 NOTE — Telephone Encounter (Signed)
 Mailbox full

## 2023-12-16 NOTE — Telephone Encounter (Signed)
 Called a couple of companies and they did not have it.  Advised patient that she just need to go to [facility] [facility], [place] or [facility].

## 2023-12-16 NOTE — Telephone Encounter (Signed)
 Pt called regarding raised toilet seat. Please call back and advise on the status.

## 2023-12-16 NOTE — Telephone Encounter (Signed)
 Copied from CRM [id number]. Topic: General - Other >> Dec 16, 2023 12:47 PM [place] J wrote: Reason for CRM: Patient calling in to return a missed call from [facility] Wayna Hails, [place]).

## 2023-12-20 ENCOUNTER — Ambulatory Visit: Payer: Self-pay | Admitting: Family Medicine

## 2023-12-20 DIAGNOSIS — R197 Diarrhea, unspecified: Secondary | ICD-10-CM | POA: Diagnosis not present

## 2023-12-20 DIAGNOSIS — R112 Nausea with vomiting, unspecified: Secondary | ICD-10-CM | POA: Diagnosis not present

## 2023-12-20 DIAGNOSIS — Z800 Family history of malignant neoplasm of digestive organs: Secondary | ICD-10-CM | POA: Diagnosis not present

## 2023-12-20 DIAGNOSIS — R1013 Epigastric pain: Secondary | ICD-10-CM | POA: Diagnosis not present

## 2023-12-20 DIAGNOSIS — K219 Gastro-esophageal reflux disease without esophagitis: Secondary | ICD-10-CM | POA: Diagnosis not present

## 2023-12-20 NOTE — Telephone Encounter (Signed)
 Third attempt to call pt with no answer. A voicemail was left with a request for call back. Office number provided for pt

## 2023-12-20 NOTE — Telephone Encounter (Signed)
 Called patient with no answer.  Left voicemail. 2nd attempt

## 2023-12-20 NOTE — Telephone Encounter (Signed)
 Patient called in reporting diarrhea to agent but upon transfer, patient hung up. This RN attempted to contact patient back and no answer. This RN left voicemail.   Copied from CRM [id number]. Topic: Clinical - Red Word Triage >> Dec 20, 2023  2:59 PM Julie Oddi wrote: Red Word that prompted transfer to Nurse Triage: Diarrhea

## 2023-12-21 ENCOUNTER — Encounter: Payer: Self-pay | Admitting: Emergency Medicine

## 2023-12-21 LAB — PROTEIN ELECTROPHORESIS, SERUM, WITH REFLEX
A/G Ratio: 1.1 (ref 0.7–1.7)
Albumin ELP: 3.4 g/dL (ref 2.9–4.4)
Alpha-1-Globulin: 0.2 g/dL (ref 0.0–0.4)
Alpha-2-Globulin: 0.9 g/dL (ref 0.4–1.0)
Beta Globulin: 1 g/dL (ref 0.7–1.3)
Gamma Globulin: 0.8 g/dL (ref 0.4–1.8)
Globulin, Total: 3 g/dL (ref 2.2–3.9)
M-Spike, %: 0.5 g/dL — ABNORMAL HIGH
SPEP Interpretation: 0
Total Protein ELP: 6.4 g/dL (ref 6.0–8.5)

## 2023-12-21 LAB — IMMUNOFIXATION REFLEX, SERUM
IgA: 27 mg/dL — ABNORMAL LOW (ref 87–352)
IgG (Immunoglobin G), Serum: 976 mg/dL (ref 586–1602)
IgM (Immunoglobulin M), Srm: 19 mg/dL — ABNORMAL LOW (ref 26–217)

## 2023-12-21 NOTE — Telephone Encounter (Signed)
Called patient to follow up on her triage call. Patient stated she had a colonoscopy on 12/13/23. They said everything was fine. Three days afterward she started having diarrhea again. She had another GI appointment on 12/20/23 and is still waiting on that report. She is currently taking Imodium for the diarrhea.

## 2023-12-21 NOTE — Telephone Encounter (Signed)
Continuing to try to reach patient by phone. Fourth voicemail left for patient. Will send a MyChart message

## 2023-12-21 NOTE — Telephone Encounter (Signed)
Patient also stated she is staring to have some weakness

## 2023-12-22 ENCOUNTER — Telehealth: Payer: Self-pay | Admitting: *Deleted

## 2023-12-22 ENCOUNTER — Other Ambulatory Visit: Payer: Self-pay | Admitting: Emergency Medicine

## 2023-12-22 DIAGNOSIS — R1084 Generalized abdominal pain: Secondary | ICD-10-CM

## 2023-12-22 NOTE — Telephone Encounter (Signed)
Called and spoke with patient. She said when she was checked her for C diff it was negative. Lab appointment has been made for tomorrow (12/23/2023)

## 2023-12-22 NOTE — Telephone Encounter (Signed)
Pt has appt for tomorrow and we need lab orders.

## 2023-12-23 ENCOUNTER — Other Ambulatory Visit (INDEPENDENT_AMBULATORY_CARE_PROVIDER_SITE_OTHER): Payer: 59

## 2023-12-23 ENCOUNTER — Encounter: Payer: Self-pay | Admitting: Family Medicine

## 2023-12-23 DIAGNOSIS — R1084 Generalized abdominal pain: Secondary | ICD-10-CM

## 2023-12-23 DIAGNOSIS — A0472 Enterocolitis due to Clostridium difficile, not specified as recurrent: Secondary | ICD-10-CM

## 2023-12-23 LAB — CBC WITH DIFFERENTIAL/PLATELET
Basophils Absolute: 0 10*3/uL (ref 0.0–0.1)
Basophils Relative: 1 % (ref 0.0–3.0)
Eosinophils Absolute: 0.1 10*3/uL (ref 0.0–0.7)
Eosinophils Relative: 3.7 % (ref 0.0–5.0)
HCT: 37.8 % (ref 36.0–46.0)
Hemoglobin: 12.4 g/dL (ref 12.0–15.0)
Lymphocytes Relative: 25 % (ref 12.0–46.0)
Lymphs Abs: 0.8 10*3/uL (ref 0.7–4.0)
MCHC: 32.9 g/dL (ref 30.0–36.0)
MCV: 91.6 fL (ref 78.0–100.0)
Monocytes Absolute: 0.3 10*3/uL (ref 0.1–1.0)
Monocytes Relative: 9.3 % (ref 3.0–12.0)
Neutro Abs: 2 10*3/uL (ref 1.4–7.7)
Neutrophils Relative %: 61 % (ref 43.0–77.0)
Platelets: 119 10*3/uL — ABNORMAL LOW (ref 150.0–400.0)
RBC: 4.13 Mil/uL (ref 3.87–5.11)
RDW: 14.7 % (ref 11.5–15.5)
WBC: 3.3 10*3/uL — ABNORMAL LOW (ref 4.0–10.5)

## 2023-12-23 LAB — COMPREHENSIVE METABOLIC PANEL
ALT: 12 U/L (ref 0–35)
AST: 20 U/L (ref 0–37)
Albumin: 4 g/dL (ref 3.5–5.2)
Alkaline Phosphatase: 47 U/L (ref 39–117)
BUN: 14 mg/dL (ref 6–23)
CO2: 24 meq/L (ref 19–32)
Calcium: 8.8 mg/dL (ref 8.4–10.5)
Chloride: 107 meq/L (ref 96–112)
Creatinine, Ser: 0.55 mg/dL (ref 0.40–1.20)
GFR: 93.43 mL/min (ref >60.00)
Glucose, Bld: 95 mg/dL (ref 70–99)
Potassium: 3.8 meq/L (ref 3.5–5.1)
Sodium: 141 meq/L (ref 135–145)
Total Bilirubin: 0.7 mg/dL (ref 0.2–1.2)
Total Protein: 6.6 g/dL (ref 6.0–8.3)

## 2023-12-23 LAB — SEDIMENTATION RATE: Sed Rate: 46 mm/h — ABNORMAL HIGH (ref 0–30)

## 2023-12-23 LAB — MAGNESIUM: Magnesium: 1.6 mg/dL (ref 1.5–2.5)

## 2023-12-24 ENCOUNTER — Inpatient Hospital Stay: Payer: 59

## 2023-12-24 ENCOUNTER — Encounter: Payer: Self-pay | Admitting: Family Medicine

## 2023-12-24 ENCOUNTER — Encounter: Payer: Self-pay | Admitting: Hematology & Oncology

## 2023-12-24 ENCOUNTER — Telehealth: Payer: Self-pay | Admitting: Family Medicine

## 2023-12-24 ENCOUNTER — Inpatient Hospital Stay: Payer: 59 | Attending: Hematology & Oncology

## 2023-12-24 ENCOUNTER — Ambulatory Visit (INDEPENDENT_AMBULATORY_CARE_PROVIDER_SITE_OTHER): Payer: 59 | Admitting: Family Medicine

## 2023-12-24 ENCOUNTER — Ambulatory Visit: Payer: 59 | Admitting: Physician Assistant

## 2023-12-24 ENCOUNTER — Inpatient Hospital Stay: Payer: 59 | Admitting: Hematology & Oncology

## 2023-12-24 ENCOUNTER — Other Ambulatory Visit (HOSPITAL_COMMUNITY)
Admission: RE | Admit: 2023-12-24 | Discharge: 2023-12-24 | Disposition: A | Discharge status: Home / Self Care | Payer: 59 | Source: Ambulatory Visit | Attending: Family Medicine | Admitting: Family Medicine

## 2023-12-24 ENCOUNTER — Other Ambulatory Visit: Payer: 59

## 2023-12-24 VITALS — BP 153/76 | HR 92 | Temp 98.2°F | Resp 19 | Ht 66.0 in | Wt 188.8 lb

## 2023-12-24 VITALS — BP 146/74 | HR 66

## 2023-12-24 VITALS — BP 122/86 | HR 89 | Temp 97.8°F | Resp 20 | Ht 66.0 in | Wt 191.2 lb

## 2023-12-24 DIAGNOSIS — C9002 Multiple myeloma in relapse: Secondary | ICD-10-CM

## 2023-12-24 DIAGNOSIS — R197 Diarrhea, unspecified: Secondary | ICD-10-CM | POA: Insufficient documentation

## 2023-12-24 DIAGNOSIS — R350 Frequency of micturition: Secondary | ICD-10-CM | POA: Diagnosis not present

## 2023-12-24 DIAGNOSIS — C9000 Multiple myeloma not having achieved remission: Secondary | ICD-10-CM | POA: Insufficient documentation

## 2023-12-24 DIAGNOSIS — D801 Nonfamilial hypogammaglobulinemia: Secondary | ICD-10-CM

## 2023-12-24 DIAGNOSIS — N898 Other specified noninflammatory disorders of vagina: Secondary | ICD-10-CM | POA: Diagnosis not present

## 2023-12-24 DIAGNOSIS — D500 Iron deficiency anemia secondary to blood loss (chronic): Secondary | ICD-10-CM

## 2023-12-24 DIAGNOSIS — A0472 Enterocolitis due to Clostridium difficile, not specified as recurrent: Secondary | ICD-10-CM

## 2023-12-24 LAB — CBC WITH DIFFERENTIAL (CANCER CENTER ONLY)
Abs Immature Granulocytes: 0.01 10*3/uL (ref 0.00–0.07)
Basophils Absolute: 0.1 10*3/uL (ref 0.0–0.1)
Basophils Relative: 1 %
Eosinophils Absolute: 0.1 10*3/uL (ref 0.0–0.5)
Eosinophils Relative: 2 %
HCT: 36.6 % (ref 36.0–46.0)
Hemoglobin: 12.1 g/dL (ref 12.0–15.0)
Immature Granulocytes: 0 %
Lymphocytes Relative: 22 %
Lymphs Abs: 0.8 10*3/uL (ref 0.7–4.0)
MCH: 30 pg (ref 26.0–34.0)
MCHC: 33.1 g/dL (ref 30.0–36.0)
MCV: 90.6 fL (ref 80.0–100.0)
Monocytes Absolute: 0.3 10*3/uL (ref 0.1–1.0)
Monocytes Relative: 9 %
Neutro Abs: 2.5 10*3/uL (ref 1.7–7.7)
Neutrophils Relative %: 66 %
Platelet Count: 122 10*3/uL — ABNORMAL LOW (ref 150–400)
RBC: 4.04 MIL/uL (ref 3.87–5.11)
RDW: 13.4 % (ref 11.5–15.5)
WBC Count: 3.8 10*3/uL — ABNORMAL LOW (ref 4.0–10.5)
nRBC: 0 % (ref 0.0–0.2)

## 2023-12-24 LAB — LACTATE DEHYDROGENASE: LDH: 193 U/L — ABNORMAL HIGH (ref 98–192)

## 2023-12-24 LAB — CMP (CANCER CENTER ONLY)
ALT: 13 U/L (ref 0–44)
AST: 20 U/L (ref 15–41)
Albumin: 4.1 g/dL (ref 3.5–5.0)
Alkaline Phosphatase: 51 U/L (ref 38–126)
Anion gap: 10 (ref 5–15)
BUN: 17 mg/dL (ref 8–23)
CO2: 25 mmol/L (ref 22–32)
Calcium: 9.5 mg/dL (ref 8.9–10.3)
Chloride: 107 mmol/L (ref 98–111)
Creatinine: 0.72 mg/dL (ref 0.44–1.00)
GFR, Estimated: >60 mL/min (ref >60)
Glucose, Bld: 124 mg/dL — ABNORMAL HIGH (ref 70–99)
Potassium: 3.5 mmol/L (ref 3.5–5.1)
Sodium: 142 mmol/L (ref 135–145)
Total Bilirubin: 0.5 mg/dL (ref 0.0–1.2)
Total Protein: 6.8 g/dL (ref 6.5–8.1)

## 2023-12-24 LAB — POC URINALSYSI DIPSTICK (AUTOMATED)
Bilirubin, UA: NEGATIVE
Blood, UA: NEGATIVE
Glucose, UA: NEGATIVE
Ketones, UA: NEGATIVE
Nitrite, UA: NEGATIVE
Protein, UA: NEGATIVE
Spec Grav, UA: 1.02 (ref 1.010–1.025)
Urobilinogen, UA: 0.2 U/dL
pH, UA: 6 (ref 5.0–8.0)

## 2023-12-24 MED ORDER — FLUCONAZOLE 150 MG PO TABS: 150.0000 mg | ORAL_TABLET | Freq: Every day | ORAL | 0 refills | Status: DC

## 2023-12-24 MED ORDER — CIPROFLOXACIN HCL 250 MG PO TABS: 250.0000 mg | ORAL_TABLET | Freq: Two times a day (BID) | ORAL | 0 refills | Status: AC

## 2023-12-24 MED ORDER — SODIUM CHLORIDE 0.9% FLUSH
10.0000 mL | INTRAVENOUS | Status: DC | PRN
Start: 2023-12-24 — End: 2023-12-24
  Administered 2023-12-24: 10 mL via INTRAVENOUS

## 2023-12-24 MED ORDER — HEPARIN SOD (PORK) LOCK FLUSH 100 UNIT/ML IV SOLN
500.0000 [IU] | Freq: Once | INTRAVENOUS | Status: AC
  Administered 2023-12-24: 500 [IU] via INTRAVENOUS

## 2023-12-24 MED ORDER — SODIUM CHLORIDE 0.9 % IV SOLN: INTRAVENOUS | Status: DC

## 2023-12-24 NOTE — Patient Instructions (Signed)

## 2023-12-24 NOTE — Progress Notes (Signed)
Hematology and Oncology Follow Up Visit  Tami [name] [id number] 10/02/1956 68 y.o. 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  Unfortunately, she is having more diarrhea.  She has been dehydrated.  She has been to the ER.  I just feel bad about this for her.  It certainly seems that she may be a candidate for a stool transplant.  I know this is being done.    I really think that we can hold off on treatment for right now.  She has done incredibly well.  Her monoclonal studies have improved so much.  Her last M spike was down to 0.5 g/dL.  Her IgG level was down to 970 mg/dL.  Kappa light chain was down to 0.6 mg/dL.  I just want her to have better quality of life.  I think by holding treatment right now, we can certainly give her some quality of life and maybe see if this diarrhea can improve.  I would not think that what she is taking would be causing her diarrhea.  However, I cannot be totally certain.  By stopping her treatment, we should be able to know.  She has had no fever.  She has had no bleeding.  There has been no cough or shortness of breath.  She has had no problems with her pacemaker.  There has been no leg swelling.  Overall, I would have to say that her performance status is probably ECOG 1-2.     Medications:  Allergies as of 12/24/2023       Reactions   Benazepril Anaphylaxis, Swelling, Hives   angioedema Throat and lip swelling   Ondansetron Hcl Hives   Redness and  hives post IV admin on 07/05/17   Codeine Nausea And Vomiting   Morphine Hives   Redness and hives noted post IV admin on 07/05/17        Medication List        Accurate as of December 24, 2023 10:16 AM. If you have any questions, ask your nurse or doctor.          acetaminophen 500 MG tablet Commonly known as: TYLENOL Take 500 mg by mouth every 6 (six) hours as needed.   acyclovir 400 MG tablet Commonly known as: ZOVIRAX Take 1 tablet (400 mg total) by mouth 2 (two) times daily.   albuterol (2.5 MG/3ML) 0.083% nebulizer solution Commonly known as: PROVENTIL Take 3 mLs (2.5 mg total) by nebulization every 6 (six) hours as needed for wheezing or shortness of breath.   albuterol 108 (90 Base) MCG/ACT inhaler Commonly known as: VENTOLIN HFA Inhale 2 puffs into the lungs every 6 (six) hours as needed for wheezing or shortness of breath.   ALPRAZolam 0.25 MG tablet Commonly known as: XANAX Take 1-2 tablets (0.25-0.5 mg total) by mouth 3 (three) times daily as needed for anxiety.   amLODipine 5 MG tablet Commonly known as: NORVASC Take 5 mg by  mouth daily.   aspirin EC 81 MG tablet Take 81 mg by mouth daily. Swallow whole.   budesonide 0.5 MG/2ML nebulizer solution Commonly known as: Pulmicort Take 2 mLs (0.5 mg total) by nebulization 2 (two) times daily as needed (cough, wheezing or shortness of breath).   celecoxib 100 MG capsule Commonly known as: CELEBREX Take 100 mg by mouth 2 (two) times daily.   CENTRUM SILVER WOMEN 50+ PO Take 1 tablet by mouth daily at 6 (six) AM.   cetirizine 10 MG tablet Commonly known as: ZYRTEC Take 10 mg by mouth at bedtime.   cholestyramine light 4 g packet Commonly known as: PREVALITE Take 4 g by mouth 2 (two) times daily.   dicyclomine 10 MG capsule Commonly known as: BENTYL Take 1 capsule (10 mg total) by mouth 4 (four) times daily -  before meals and at bedtime. cramping   diphenoxylate-atropine 2.5-0.025 MG  tablet Commonly known as: Lomotil Take 1 tablet by mouth 2 (two) times daily.   famotidine 20 MG tablet Commonly known as: PEPCID Take 1 tablet (20 mg total) by mouth at bedtime.   fidaxomicin 200 MG Tabs tablet Commonly known as: DIFICID Take 1 tablet (200 mg total) by mouth 2 (two) times daily.   FLORASTOR PO Take 500 mg by mouth daily at 6 (six) AM.   fluconazole 150 MG tablet Commonly known as: Diflucan Take 1 tablet today as directed; repeat after 72 hours   fluticasone 50 MCG/ACT nasal spray Commonly known as: FLONASE Place 2 sprays into both nostrils daily.   furosemide 20 MG tablet Commonly known as: LASIX Take 20 mg by mouth daily as needed (fluid retention (feet swelling)).   HYDROcodone bit-homatropine 5-1.5 MG/5ML syrup Commonly known as: Hydromet Take 5 mLs by mouth every 8 (eight) hours as needed for cough.   hydrOXYzine 10 MG tablet Commonly known as: ATARAX TAKE ONE (1) TABLET BY MOUTH 3 TIMES DAILY AS NEEDED FOR ITCHING   hyoscyamine 0.125 MG SL tablet Commonly known as: LEVSIN SL Place 1 tablet (0.125 mg total) under the tongue every 4 (four) hours as needed.   lidocaine-prilocaine cream Commonly known as: EMLA Apply 1 Application topically as needed.   loperamide 2 MG capsule Commonly known as: IMODIUM Take 4 mg by mouth in the morning and at bedtime.   losartan 50 MG tablet Commonly known as: COZAAR Take 50 mg by mouth in the morning and at bedtime.   Magnesium Glycinate 100 MG Caps Take 200 mg by mouth at bedtime.   meclizine 12.5 MG tablet Commonly known as: ANTIVERT Take 12.5 mg by mouth 2 (two) times daily as needed for dizziness or nausea.   metoprolol succinate 50 MG 24 hr tablet Commonly known as: TOPROL-XL Take 1 tablet (50 mg total) by mouth 2 (two) times daily. What changed: when to take this   montelukast 10 MG tablet Commonly known as: Singulair Take 1 tablet (10 mg total) by mouth at bedtime. Please start taking this on  September 4.   neomycin-polymyxin-hydrocortisone OTIC solution Commonly known as: CORTISPORIN Place 4 drops into both ears 4 (four) times daily.   nortriptyline 10 MG capsule Commonly known as: Pamelor Take 1 capsule (10 mg total) by mouth at bedtime.   omeprazole 20 MG capsule Commonly known as: PRILOSEC SMARTSIG:1.0 Capsule(s) By Mouth Daily   ondansetron 8 MG tablet Commonly known as: Zofran Take 1 tablet (8 mg total) by mouth every 8 (eight) hours as needed for nausea or vomiting.  oxyCODONE-acetaminophen 5-325 MG tablet Commonly known as: PERCOCET/ROXICET Take 1 tablet by mouth every 6 (six) hours as needed for severe pain.   oxyCODONE-acetaminophen 5-325 MG tablet Commonly known as: Percocet Take 2 tablets by mouth every 6 (six) hours as needed.   pantoprazole 40 MG tablet Commonly known as: PROTONIX Take 1 tablet (40 mg total) by mouth daily.   Pillow Mask/Adult Misc . Provide PAP supplies (expires 12 months from 09/21/2023) .   potassium chloride SA 20 MEQ tablet Commonly known as: KLOR-CON M TAKE 1 TABLET BY MOUTH EVERY MONDAY, WEDNESDAY, AND FRIDAY.   prochlorperazine 10 MG tablet Commonly known as: COMPAZINE Take 0.5 tablets (5 mg total) by mouth 2 (two) times daily as needed for nausea or vomiting.   scopolamine 1 MG/3DAYS Commonly known as: TRANSDERM-SCOP Place 1 patch (1.5 mg total) onto the skin every 3 (three) days.   sertraline 50 MG tablet Commonly known as: ZOLOFT Take 1 tablet (50 mg total) by mouth daily.   spironolactone 25 MG tablet Commonly known as: ALDACTONE Take 25 mg by mouth daily.   vancomycin 125 MG capsule Commonly known as: VANCOCIN Take 1 capsule (125 mg total) by mouth 4 (four) times daily for 10 days, THEN 1 capsule (125 mg total) in the morning, at noon, and at bedtime for 7 days, THEN 1 capsule (125 mg total) in the morning and at bedtime for 7 days, THEN 1 capsule (125 mg total) daily for 7 days. Start taking on:  December 02, 2023   Vitamin D3 50 MCG (2000 UT) Tabs Take 2,000 Units by mouth in the morning.        Allergies:  Allergies  Allergen Reactions   Benazepril Anaphylaxis, Swelling and Hives    angioedema Throat and lip swelling   Ondansetron Hcl Hives    Redness and hives post IV admin on 07/05/17   Codeine Nausea And Vomiting   Morphine Hives    Redness and hives noted post IV admin on 07/05/17    Past Medical History, Surgical history, Social history, and Family History were reviewed and updated.  Review of Systems: Review of Systems  Constitutional: Negative.   HENT: Negative.    Eyes: Negative.   Respiratory: Negative.    Cardiovascular:  Positive for palpitations.  Gastrointestinal:  Positive for abdominal pain, diarrhea and vomiting.  Genitourinary: Negative.   Musculoskeletal: Negative.   Skin: Negative.   Neurological: Negative.   Endo/Heme/Allergies: Negative.   Psychiatric/Behavioral: Negative.        Physical Exam:  height is 5\' 6"  (1.676 m) and weight is 188 lb 12.8 oz (85.6 kg). Her oral temperature is 98.2 F (36.8 C). Her blood pressure is 153/76 (abnormal) and her pulse is 92. Her respiration is 19 and oxygen saturation is 96%.   Wt Readings from Last 3 Encounters:  12/24/23 188 lb 12.8 oz (85.6 kg)  12/15/23 186 lb (84.4 kg)  12/14/23 187 lb 6.4 oz (85 kg)   Physical Exam Vitals reviewed.  HENT:     Head: Normocephalic and atraumatic.  Eyes:     Pupils: Pupils are equal, round, and reactive to light.  Cardiovascular:     Rate and Rhythm: Normal rate and regular rhythm.     Heart sounds: Normal heart sounds.  Pulmonary:     Effort: Pulmonary effort is normal.     Breath sounds: Normal breath sounds.  Abdominal:     General: Bowel sounds are normal.     Palpations: Abdomen is soft.  Musculoskeletal:        General: No tenderness or deformity. Normal range of motion.     Cervical back: Normal range of motion.  Lymphadenopathy:      Cervical: No cervical adenopathy.  Skin:    General: Skin is warm and dry.     Findings: No erythema or rash.  Neurological:     Mental Status: She is alert and oriented to person, place, and time.  Psychiatric:        Behavior: Behavior normal.        Thought Content: Thought content normal.        Judgment: Judgment normal.    Lab Results  Component Value Date   WBC 3.8 (L) 12/24/2023   HGB 12.1 12/24/2023   HCT 36.6 12/24/2023   MCV 90.6 12/24/2023   PLT 122 (L) 12/24/2023   Lab Results  Component Value Date   FERRITIN 166 11/26/2023   IRON 76 11/26/2023   TIBC 385 11/26/2023   UIBC 309 11/26/2023   IRONPCTSAT 20 11/26/2023   Lab Results  Component Value Date   RETICCTPCT 1.2 11/27/2022   RBC 4.04 12/24/2023   RETICCTABS 32,640 07/16/2016   Lab Results  Component Value Date   KPAFRELGTCHN 6.4 12/10/2023   LAMBDASER <1.5 (L) 12/10/2023   KAPLAMBRATIO >4.27 (H) 12/10/2023   Lab Results  Component Value Date   IGGSERUM 1,004 12/10/2023   IGGSERUM 976 12/10/2023   IGA 27 (L) 12/10/2023   IGA 27 (L) 12/10/2023   IGMSERUM 21 (L) 12/10/2023   IGMSERUM 19 (L) 12/10/2023   Lab Results  Component Value Date   TOTALPROTELP 6.4 12/10/2023   ALBUMINELP 3.4 12/10/2023   A1GS 0.2 12/10/2023   A2GS 0.9 12/10/2023   BETS 1.0 12/10/2023   BETA2SER 0.5 07/16/2016   GAMS 0.8 12/10/2023   MSPIKE 0.5 (H) 12/10/2023   SPEI Comment 10/27/2022     Chemistry      Component Value Date/Time   NA 142 12/24/2023 0910   NA 143 11/08/2017 1127   NA 140 12/31/2016 1000   K 3.5 12/24/2023 0910   K 4.0 11/08/2017 1127   K 3.6 12/31/2016 1000   CL 107 12/24/2023 0910   CL 107 11/08/2017 1127   CO2 25 12/24/2023 0910   CO2 25 11/08/2017 1127   CO2 24 12/31/2016 1000   BUN 17 12/24/2023 0910   BUN 19 11/08/2017 1127   BUN 22.3 12/31/2016 1000   CREATININE 0.72 12/24/2023 0910   CREATININE 0.9 11/08/2017 1127   CREATININE 0.8 12/31/2016 1000      Component Value  Date/Time   CALCIUM 9.5 12/24/2023 0910   CALCIUM 9.9 11/08/2017 1127   CALCIUM 9.5 12/31/2016 1000   ALKPHOS 51 12/24/2023 0910   ALKPHOS 44 11/08/2017 1127   ALKPHOS 51 12/31/2016 1000   AST 20 12/24/2023 0910   AST 28 12/31/2016 1000   ALT 13 12/24/2023 0910   ALT 36 11/08/2017 1127   ALT 16 12/31/2016 1000   BILITOT 0.5 12/24/2023 0910   BILITOT 0.46 12/31/2016 1000       Impression and Plan: Ms. Gergen is a very pleasant 68 yo African American female with a previous IgG kappa MGUS with progression to myeloma.  Again, she has responded very nicely to treatment.   We will give her IV fluid today.  Again, we are going to hold treatment on her.  I will have her come back in 1 month.  We will then see how she  is doing.    Josph Macho, MD 2/14/202510:16 AM

## 2023-12-24 NOTE — Telephone Encounter (Signed)
Copied from CRM [id number]. Topic: General - Other >> Dec 24, 2023 11:37 AM Gurney Maxin H wrote: Reason for CRM: Patient states she had a missed call from office but no voicemail, agent didn't see any notes as well as to who or why office called. Please reach back out to patient, thanks.  [name] [phone]

## 2023-12-24 NOTE — Progress Notes (Signed)
Established Patient Office Visit  Subjective   [name], female    DOB: [birth year]  Age: 68 y.o. MRN: [id number]  Chief Complaint  Patient presents with   Urinary Frequency    Sxs stated x2 days ago, pt states having freq and itching    HPI Discussed the use of AI scribe software for clinical note transcription with the patient, who gave verbal consent to proceed.  History of Present Illness   Tami [name] is a 68 year old female with recurrent urinary tract infections who presents with symptoms of a possible yeast or urinary tract infection.  She experiences itching, which she suspects may be due to either a yeast infection or a urinary tract infection (UTI). No current pain during urination, but she notes urinating only small amounts of water. No discharge is present.  She has a history of recurrent urinary tract infections, with a recent episode in December while visiting her sister in [place]. During that episode, she initially took amoxicillin, which was later changed to Cipro after the initial treatment was ineffective. The current symptoms feel similar to her previous infections.  She was hospitalized two weeks ago for Clostridioides difficile (C. diff) infection, which has since cleared up. She recently submitted another stool sample to ensure the infection has not returned. She has been experiencing diarrhea, which she associates with her recent hospitalization for C. diff.  She is concerned about the potential for recurrent infections due to her history of multiple myeloma, which can increase susceptibility to infections.      Patient Active Problem List   Diagnosis Date Noted   Acute pain of left shoulder 09/03/2023   Chronic combined systolic and diastolic congestive heart failure (HCC) 03/28/2023   Intractable nausea and vomiting 03/27/2023   Asthma 12/20/2022   Diarrhea    Hypocalcemia 08/25/2022   Iron deficiency anemia due to chronic  blood loss 05/28/2022   Chronic diarrhea 05/27/2022   Soft tissue lesion 04/28/2022   Obesity (BMI 30-39.9) 04/28/2022   SBO (small bowel obstruction) (HCC) 04/27/2022   Dehydration 04/03/2022   Acute vaginitis 03/18/2022   Cervical cancer screening 03/18/2022   Dizziness 12/11/2021   Visual changes 11/26/2021   Ear fullness, bilateral 08/12/2021   Bilateral knee pain 08/08/2021   History of 2019 novel coronavirus disease (COVID-19) 12/06/2020   Vitamin deficiency 07/31/2020   Acute combined systolic and diastolic heart failure (HCC) 05/09/2020   Hypomagnesemia 03/04/2020   Thrombocytopenia (HCC) 03/04/2020   Dysphagia 03/04/2020   Constipation 03/04/2020   Sensation of pressure in bladder area 01/17/2020   Hyperglycemia 01/17/2020   Peripheral neuropathy 11/15/2019   Chronic migraine without aura, with intractable migraine, so stated, with status migrainosus 10/17/2019   ETD (Eustachian tube dysfunction), bilateral 09/12/2019   Urinary frequency 07/17/2019   Headache 07/17/2019   Palpitation 05/15/2019   Clostridium difficile diarrhea 09/04/2018   Thyroid nodule 08/26/2018   Multiple myeloma (HCC) 07/20/2018   Multiple myeloma not having achieved remission (HCC) 07/20/2018   Hematuria 07/11/2018   Seasonal allergies 06/23/2018   Right shoulder pain 06/07/2018   Neck pain 06/07/2018   Paresthesias 06/07/2018   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   Pain in thoracic spine 03/18/2018   Thoracic back pain 02/15/2018   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   Hypokalemia 08/20/2017   Anemia 08/20/2017   Elevated sed rate 08/15/2017   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   Epigastric abdominal pain 03/22/2017   Myalgia  12/31/2016   Abnormal SPEP 07/26/2016   Low back pain 07/14/2016   Insomnia 12/29/2015   Tension headache 10/21/2015   Muscle spasms of neck 10/18/2015   Dysuria 10/13/2015   AP (abdominal pain) 07/07/2015   Cardiomyopathy (HCC)  04/19/2015   Chest pain 04/19/2015   Obstructive sleep apnea 03/31/2015   Congestive heart failure (HCC) 02/24/2015   Arthritis    Hyperlipidemia    Hypertension    Depression    Stroke [facility])    GERD (gastroesophageal reflux disease)    Bowel obstruction (HCC) 11/09/2014   Past Medical History:  Diagnosis Date   Abnormal SPEP 07/26/2016   Arthritis    knees, hands   Back pain 07/14/2016   Bowel obstruction (HCC) 11/2014   C. 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   Multiple myeloma (HCC) 07/20/2018   Multiple myeloma not having achieved remission (HCC) 07/20/2018   Myalgia 12/31/2016   Obstructive sleep apnea 03/31/2015   Presence of permanent cardiac pacemaker    SOB (shortness of breath) 04/09/2016   Stroke (HCC)    TIAs   UTI (urinary tract infection)    Past Surgical History:  Procedure Laterality Date   ABDOMINAL HYSTERECTOMY     menorraghia, 2006, total   BIOPSY  09/01/2022   Procedure: BIOPSY;  Surgeon: Lynann Bologna, MD;  Location: [facility];  Service: Gastroenterology;;   CHOLECYSTECTOMY     COLONOSCOPY  2018   [facility] per patient   COLONOSCOPY WITH PROPOFOL N/A 09/01/2022   Procedure: COLONOSCOPY WITH PROPOFOL;  Surgeon: Lynann Bologna, MD;  Location: [facility];  Service: Gastroenterology;  Laterality: N/A;   ESOPHAGOGASTRODUODENOSCOPY  2018   [facility]    ESOPHAGOGASTRODUODENOSCOPY (EGD) WITH PROPOFOL N/A 09/01/2022   Procedure: ESOPHAGOGASTRODUODENOSCOPY (EGD) WITH  PROPOFOL;  Surgeon: Lynann Bologna, MD;  Location: [facility];  Service: Gastroenterology;  Laterality: N/A;   IR IMAGING GUIDED PORT INSERTION  06/29/2023   KNEE SURGERY     right, repair torn torn cartialage   PACEMAKER INSERTION  08/2014   changed last in 2023 per pt at [facility]   TONSILLECTOMY     TUBAL LIGATION     Social History   Tobacco Use   Smoking status: Never    Passive exposure: Never   Smokeless tobacco: Never  Vaping Use   Vaping status: Never Used  Substance Use Topics   Alcohol use: No   Drug use: No   Social History   Socioeconomic History   Marital status: Single    Spouse name: [name]   Number of children: 3   Years of education: [name]   Highest education level: High school graduate  Occupational History   [name]  Tobacco Use   Smoking status: Never    Passive exposure: Never   Smokeless tobacco: Never  Vaping Use   Vaping status: Never Used  Substance and Sexual Activity   Alcohol use: No   Drug use: No   Sexual activity: Not  Currently  Other Topics Concern   [name]  Social History Narrative   Lives at home alone   Retired   Caffeine: coffee   Social Drivers of Health   Financial Resource Strain: Medium Risk (07/08/2023)   Overall Financial Resource Strain (CARDIA)    Difficulty of Paying Living Expenses: Somewhat hard  Food Insecurity: Low Risk  (12/20/2023)   Received from [facility]   Hunger Vital Sign    Worried About Running Out of Food in the Last Year: Never true    Ran Out of Food in the Last Year: Never true  Transportation Needs: No Transportation Needs (12/20/2023)   Received from [facility]    In the past 12 months, has lack of reliable transportation kept you from medical appointments, meetings, work or from getting things needed for daily living? : No  Physical Activity: Inactive (07/08/2023)   Exercise Vital Sign    Days of Exercise per Week: 0 days    Minutes of Exercise per  Session: 0 min  Stress: No Stress Concern Present (01/21/2023)   [employer] of Occupational Health - Occupational Stress Questionnaire    Feeling of Stress : Not at all  Social Connections: Moderately Isolated (07/08/2023)   Social Connection and Isolation Panel [NHANES]    Frequency of Communication with Friends and Family: More than three times a week    Frequency of Social Gatherings with Friends and Family: Twice a week    Attends Religious Services: More than 4 times per year    Active Member of [employer] or Organizations: No    Attends Banker Meetings: Never    Marital Status: Divorced  Catering manager Violence: Not At Risk (11/29/2023)   Humiliation, Afraid, Rape, and Kick questionnaire    Fear of Current or Ex-Partner: No    Emotionally Abused: No    Physically Abused: No    Sexually Abused: No   Family Status  Relation Name Status   Mother  Deceased at age 67   Father  Alive       67   Sister  Alive       18   Daughter  Alive       13   MGM  Deceased at age 43       ish   MGF  Deceased at age 85       ish   Son  Alive       1   Son  Deceased at age 63       died at birth of respiratory   PGM  Deceased   PGF  Deceased   Son  Alive       30   Neg Hx  (Not Specified)  No partnership data on file   Family History  Problem Relation Age of Onset   Diabetes Mother    Hypertension Mother    Heart disease Mother        s/p 1 stent   Hyperlipidemia Mother    Arthritis Mother    Cancer Father        COLON   Colon cancer Father 35   Irritable bowel syndrome Sister    Hyperlipidemia Daughter    Hypertension Daughter    Hypertension Maternal Grandmother    Arthritis Maternal Grandmother    Heart disease Maternal Grandfather        MI   Hypertension Maternal Grandfather    Arthritis Maternal Grandfather  Hypertension Son    Esophageal cancer Neg Hx    Allergies  Allergen Reactions   Benazepril Anaphylaxis, Swelling and Hives     angioedema Throat and lip swelling   Ondansetron Hcl Hives    Redness and hives post IV admin on 07/05/17   Codeine Nausea And Vomiting   Morphine Hives    Redness and hives noted post IV admin on 07/05/17      Review of Systems  Constitutional:  Negative for fever and malaise/fatigue.  HENT:  Negative for congestion.   Eyes:  Negative for blurred vision.  Respiratory:  Negative for shortness of breath.   Cardiovascular:  Negative for chest pain, palpitations and leg swelling.  Gastrointestinal:  Negative for abdominal pain, blood in stool and nausea.  Genitourinary:  Positive for dysuria and frequency.  Musculoskeletal:  Negative for falls.  Skin:  Negative for rash.  Neurological:  Negative for dizziness, loss of consciousness and headaches.  Endo/Heme/Allergies:  Negative for environmental allergies.  Psychiatric/Behavioral:  Negative for depression. The patient is not nervous/anxious.       Objective:     BP 122/86 (BP Location: Left Arm, Patient Position: Sitting, Cuff Size: Large)   Pulse 89   Temp 97.8 F (36.6 C) (Oral)   Resp 20   Ht 5\' 6"  (1.676 m)   Wt 191 lb 3.2 oz (86.7 kg)   SpO2 97%   BMI 30.86 kg/m  BP Readings from Last 3 Encounters:  12/24/23 122/86  12/24/23 (!) 146/74  12/24/23 (!) 153/76   Wt Readings from Last 3 Encounters:  12/24/23 191 lb 3.2 oz (86.7 kg)  12/24/23 188 lb 12.8 oz (85.6 kg)  12/15/23 186 lb (84.4 kg)   SpO2 Readings from Last 3 Encounters:  12/24/23 97%  12/24/23 98%  12/24/23 96%    Physical Exam Vitals and nursing note reviewed.  Constitutional:      General: She is not in acute distress.    Appearance: Normal appearance. She is well-developed.  HENT:     Head: Normocephalic and atraumatic.  Eyes:     General: No scleral icterus.       Right eye: No discharge.        Left eye: No discharge.  Cardiovascular:     Rate and Rhythm: Normal rate and regular rhythm.     Heart sounds: No murmur heard. Pulmonary:      Effort: Pulmonary effort is normal. No respiratory distress.     Breath sounds: Normal breath sounds.  Musculoskeletal:        General: Normal range of motion.     Cervical back: Normal range of motion and neck supple.     Right lower leg: No edema.     Left lower leg: No edema.  Skin:    General: Skin is warm and dry.  Neurological:     Mental Status: She is alert and oriented to person, place, and time.  Psychiatric:        Mood and Affect: Mood normal.        Behavior: Behavior normal.        Thought Content: Thought content normal.        Judgment: Judgment normal.      Results for orders placed or performed in visit on 12/24/23  POCT Urinalysis Dipstick (Automated)  Result Value Ref Range   Color, UA yellow    Clarity, UA hazy    Glucose, UA Negative Negative   Bilirubin, UA negative  Ketones, UA negative    Spec Grav, UA 1.020 1.010 - 1.025   Blood, UA negative    pH, UA 6.0 5.0 - 8.0   Protein, UA Negative Negative   Urobilinogen, UA 0.2 0.2 or 1.0 E.U./dL   Nitrite, UA neg    Leukocytes, UA Small (1+) (A) Negative  Results for orders placed or performed in visit on 12/24/23  Lactate dehydrogenase (LDH)  Result Value Ref Range   LDH 193 (H) 98 - 192 U/L  CMP (Cancer Center only)  Result Value Ref Range   Sodium 142 135 - 145 mmol/L   Potassium 3.5 3.5 - 5.1 mmol/L   Chloride 107 98 - 111 mmol/L   CO2 25 22 - 32 mmol/L   Glucose, Bld 124 (H) 70 - 99 mg/dL   BUN 17 8 - 23 mg/dL   Creatinine 1.61 0.96 - 1.00 mg/dL   Calcium 9.5 8.9 - 04.5 mg/dL   Total Protein 6.8 6.5 - 8.1 g/dL   Albumin 4.1 3.5 - 5.0 g/dL   AST 20 15 - 41 U/L   ALT 13 0 - 44 U/L   Alkaline Phosphatase 51 38 - 126 U/L   Total Bilirubin 0.5 0.0 - 1.2 mg/dL   GFR, Estimated >40 >98 mL/min   Anion gap 10 5 - 15  CBC with Differential (Cancer Center Only)  Result Value Ref Range   WBC Count 3.8 (L) 4.0 - 10.5 K/uL   RBC 4.04 3.87 - 5.11 MIL/uL   Hemoglobin 12.1 12.0 - 15.0 g/dL   HCT  11.9 14.7 - 82.9 %   MCV 90.6 80.0 - 100.0 fL   MCH 30.0 26.0 - 34.0 pg   MCHC 33.1 30.0 - 36.0 g/dL   RDW 56.2 13.0 - 86.5 %   Platelet Count 122 (L) 150 - 400 K/uL   nRBC 0.0 0.0 - 0.2 %   Neutrophils Relative % 66 %   Neutro Abs 2.5 1.7 - 7.7 K/uL   Lymphocytes Relative 22 %   Lymphs Abs 0.8 0.7 - 4.0 K/uL   Monocytes Relative 9 %   Monocytes Absolute 0.3 0.1 - 1.0 K/uL   Eosinophils Relative 2 %   Eosinophils Absolute 0.1 0.0 - 0.5 K/uL   Basophils Relative 1 %   Basophils Absolute 0.1 0.0 - 0.1 K/uL   Immature Granulocytes 0 %   Abs Immature Granulocytes 0.01 0.00 - 0.07 K/uL    Last CBC Lab Results  Component Value Date   WBC 3.8 (L) 12/24/2023   HGB 12.1 12/24/2023   HCT 36.6 12/24/2023   MCV 90.6 12/24/2023   MCH 30.0 12/24/2023   RDW 13.4 12/24/2023   PLT 122 (L) 12/24/2023   Last metabolic panel Lab Results  Component Value Date   GLUCOSE 124 (H) 12/24/2023   NA 142 12/24/2023   K 3.5 12/24/2023   CL 107 12/24/2023   CO2 25 12/24/2023   BUN 17 12/24/2023   CREATININE 0.72 12/24/2023   GFRNONAA >60 12/24/2023   CALCIUM 9.5 12/24/2023   PHOS 2.8 03/28/2023   PROT 6.8 12/24/2023   ALBUMIN 4.1 12/24/2023   LABGLOB 3.0 12/10/2023   AGRATIO 1.1 12/10/2023   BILITOT 0.5 12/24/2023   ALKPHOS 51 12/24/2023   AST 20 12/24/2023   ALT 13 12/24/2023   ANIONGAP 10 12/24/2023   Last lipids Lab Results  Component Value Date   CHOL 179 06/14/2023   HDL 46.90 06/14/2023   LDLCALC 113 (H) 06/14/2023   TRIG  94.0 06/14/2023   CHOLHDL 4 06/14/2023   Last hemoglobin A1c Lab Results  Component Value Date   HGBA1C 5.5 09/16/2023   Last thyroid functions Lab Results  Component Value Date   TSH 1.40 09/16/2023   Last vitamin D Lab Results  Component Value Date   VD25OH 36.33 09/16/2021   Last vitamin B12 and Folate Lab Results  Component Value Date   VITAMINB12 222 05/01/2022   FOLATE 8.6 08/30/2019      The ASCVD Risk score (Arnett DK, et al.,  2019) failed to calculate for the following reasons:   Risk score cannot be calculated because patient has a medical history suggesting prior/existing ASCVD    Assessment & Plan:   Problem List Items Addressed This Visit       Unprioritized   Urinary frequency - Primary   Relevant Medications   ciprofloxacin (CIPRO) 250 MG tablet   fluconazole (DIFLUCAN) 150 MG tablet   Other Relevant Orders   POCT Urinalysis Dipstick (Automated) (Completed)   Urine Culture  Assessment and Plan    Vaginal Itching Suspected yeast infection or UTI with frequent urination in small amounts, but no discharge or dysuria. Differential includes yeast infection and UTI. Discussed risks of untreated infections and benefits of early treatment. Option to wait for culture results before starting antibiotics was considered. Perform self-swab for yeast and bacterial infection. Prescribe Diflucan, one pill with a refill for a second, advising to take the second pill in 3-4 days if needed. Prescribe Cipro 250 mg BID for three days, advising to hold off on taking it until culture results are back unless symptoms worsen. Recommend OTC Monistat cream for external itching.  Urinary Tract Infection (UTI) Likely UTI with frequent urination in small amounts. Discussed risks of untreated UTI and benefits of early antibiotic treatment. Option to wait for culture results before starting antibiotics was considered. Prescribe Cipro 250 mg BID for three days, advising to hold off on taking it until culture results are back unless symptoms worsen. Perform urine culture to confirm diagnosis.  Diarrhea Recent C. diff infection, now resolved. Awaiting stool sample results to confirm no recurrence. Discussed risks of recurrent C. diff and need for prompt treatment if infection recurs. Await stool sample results to confirm no recurrence.  Multiple Myeloma Increased susceptibility to infections. Importance of monitoring for signs of  infection and early intervention discussed. Monitor for signs of infection and treat promptly.  Follow-up Follow up on urine culture results by Monday and stool sample results next week.        Return if symptoms worsen or fail to improve.    Donato Schultz, DO

## 2023-12-24 NOTE — Patient Instructions (Signed)
Dysuria Dysuria is pain or discomfort when you pee. The pain may be felt in your urethra, which is the part of your body that drains pee (urine) from your bladder. The pain may also be felt near your genitals, groin, or in your lower belly or back. You may have to pee often or have the sudden feeling that you need to pee. This condition can affect anyone, but it's more common in females. It can be caused by: A urinary tract infection (UTI). Kidney stones or bladder stones. Some sexually transmitted infections (STIs). Dehydration. This is when there's not enough water in your body. Irritation and swelling in the vagina. The use of some medicines. The use of some soaps or products with a scent. Follow these instructions at home: Medicines  Take your medicines only as told. Take your antibiotics as told. Do not stop taking them even if you start to feel better. Eating and drinking Drink enough fluid to keep your pee pale yellow. Certain drinks can make the pain worse. Avoid: Drinks with caffeine in them. Tea. Alcohol. In males, alcohol may irritate the prostate. General instructions Watch your condition for any changes, such as color changes in your pee. Pee often. Do not hold your pee for a long time. If you're female, wipe from front to back after you pee or poop. Use each tissue only once when you wipe. Pee after you have sex. If you've had any tests done, it's up to you to get your test results. Ask your health care provider, or the department doing the test, when your results will be ready. Contact a health care provider if: You have a fever or chills. You have pain in your back or sides. You throw up or feel like you may throw up. You have blood in your pee. You're not peeing as often as normal. You feel very weak. Get help right away if: You have very bad pain that doesn't get better with medicine. You're confused. You have a fast heartbeat while resting. This information is  not intended to replace advice given to you by your health care provider. Make sure you discuss any questions you have with your health care provider. Document Revised: 03/02/2023 Document Reviewed: 03/02/2023 Elsevier Patient Education  2024 [employer].

## 2023-12-24 NOTE — Addendum Note (Signed)
Addended by: Roxanne Gates on: 12/24/2023 02:54 PM   Modules accepted: Orders

## 2023-12-24 NOTE — Progress Notes (Deleted)
 Established patient visit   Patient: Tami [name]   DOB: [birth year]   68 y.o. Female  MRN: [id number] Visit Date: 12/24/2023  Today's healthcare provider: Alfredia Ferguson, PA-C   No chief complaint on file.  Subjective     ***  Medications: Outpatient Medications Prior to Visit  Medication Sig   acetaminophen (TYLENOL) 500 MG tablet Take 500 mg by mouth every 6 (six) hours as needed.   acyclovir (ZOVIRAX) 400 MG tablet Take 1 tablet (400 mg total) by mouth 2 (two) times daily.   albuterol (PROVENTIL) (2.5 MG/3ML) 0.083% nebulizer solution Take 3 mLs (2.5 mg total) by nebulization every 6 (six) hours as needed for wheezing or shortness of breath.   albuterol (VENTOLIN HFA) 108 (90 Base) MCG/ACT inhaler Inhale 2 puffs into the lungs every 6 (six) hours as needed for wheezing or shortness of breath.   ALPRAZolam (XANAX) 0.25 MG tablet Take 1-2 tablets (0.25-0.5 mg total) by mouth 3 (three) times daily as needed for anxiety.   amLODipine (NORVASC) 5 MG tablet Take 5 mg by mouth daily.   aspirin EC 81 MG tablet Take 81 mg by mouth daily. Swallow whole.   budesonide (PULMICORT) 0.5 MG/2ML nebulizer solution Take 2 mLs (0.5 mg total) by nebulization 2 (two) times daily as needed (cough, wheezing or shortness of breath).   celecoxib (CELEBREX) 100 MG capsule Take 100 mg by mouth 2 (two) times daily.   cetirizine (ZYRTEC) 10 MG tablet Take 10 mg by mouth at bedtime.   Cholecalciferol (VITAMIN D3) 50 MCG (2000 UT) TABS Take 2,000 Units by mouth in the morning.   cholestyramine light (PREVALITE) 4 g packet Take 4 g by mouth 2 (two) times daily.   dicyclomine (BENTYL) 10 MG capsule Take 1 capsule (10 mg total) by mouth 4 (four) times daily -  before meals and at bedtime. cramping   diphenoxylate-atropine (LOMOTIL) 2.5-0.025 MG tablet Take 1 tablet by mouth 2 (two) times daily.   famotidine (PEPCID) 20 MG tablet Take 1 tablet (20 mg total) by mouth at bedtime.   fidaxomicin (DIFICID) 200  MG TABS tablet Take 1 tablet (200 mg total) by mouth 2 (two) times daily.   fluconazole (DIFLUCAN) 150 MG tablet Take 1 tablet today as directed; repeat after 72 hours   fluticasone (FLONASE) 50 MCG/ACT nasal spray Place 2 sprays into both nostrils daily.   furosemide (LASIX) 20 MG tablet Take 20 mg by mouth daily as needed (fluid retention (feet swelling)).   HYDROcodone bit-homatropine (HYDROMET) 5-1.5 MG/5ML syrup Take 5 mLs by mouth every 8 (eight) hours as needed for cough.   hydrOXYzine (ATARAX) 10 MG tablet TAKE ONE (1) TABLET BY MOUTH 3 TIMES DAILY AS NEEDED FOR ITCHING   hyoscyamine (LEVSIN SL) 0.125 MG SL tablet Place 1 tablet (0.125 mg total) under the tongue every 4 (four) hours as needed.   lidocaine-prilocaine (EMLA) cream Apply 1 Application topically as needed.   loperamide (IMODIUM) 2 MG capsule Take 4 mg by mouth in the morning and at bedtime.   losartan (COZAAR) 50 MG tablet Take 50 mg by mouth in the morning and at bedtime.   Magnesium Glycinate 100 MG CAPS Take 200 mg by mouth at bedtime.   meclizine (ANTIVERT) 12.5 MG tablet Take 12.5 mg by mouth 2 (two) times daily as needed for dizziness or nausea.   metoprolol succinate (TOPROL-XL) 50 MG 24 hr tablet Take 1 tablet (50 mg total) by mouth 2 (two) times daily. (Patient  taking differently: Take 50 mg by mouth daily.)   montelukast (SINGULAIR) 10 MG tablet Take 1 tablet (10 mg total) by mouth at bedtime. Please start taking this on September 4.   Multiple Vitamins-Minerals (CENTRUM SILVER WOMEN 50+ PO) Take 1 tablet by mouth daily at 6 (six) AM.   neomycin-polymyxin-hydrocortisone (CORTISPORIN) OTIC solution Place 4 drops into both ears 4 (four) times daily.   nortriptyline (PAMELOR) 10 MG capsule Take 1 capsule (10 mg total) by mouth at bedtime.   omeprazole (PRILOSEC) 20 MG capsule SMARTSIG:1.0 Capsule(s) By Mouth Daily   ondansetron (ZOFRAN) 8 MG tablet Take 1 tablet (8 mg total) by mouth every 8 (eight) hours as needed for  nausea or vomiting.   oxyCODONE-acetaminophen (PERCOCET) 5-325 MG tablet Take 2 tablets by mouth every 6 (six) hours as needed.   oxyCODONE-acetaminophen (PERCOCET/ROXICET) 5-325 MG tablet Take 1 tablet by mouth every 6 (six) hours as needed for severe pain.   pantoprazole (PROTONIX) 40 MG tablet Take 1 tablet (40 mg total) by mouth daily.   potassium chloride SA (KLOR-CON M) 20 MEQ tablet TAKE 1 TABLET BY MOUTH EVERY MONDAY, WEDNESDAY, AND FRIDAY.   prochlorperazine (COMPAZINE) 10 MG tablet Take 0.5 tablets (5 mg total) by mouth 2 (two) times daily as needed for nausea or vomiting.   Respiratory Therapy Supplies (PILLOW MASK/ADULT) MISC . Provide PAP supplies (expires 12 months from 09/21/2023) .   Saccharomyces boulardii (FLORASTOR PO) Take 500 mg by mouth daily at 6 (six) AM.   scopolamine (TRANSDERM-SCOP) 1 MG/3DAYS Place 1 patch (1.5 mg total) onto the skin every 3 (three) days.   sertraline (ZOLOFT) 50 MG tablet Take 1 tablet (50 mg total) by mouth daily.   spironolactone (ALDACTONE) 25 MG tablet Take 25 mg by mouth daily.   vancomycin (VANCOCIN) 125 MG capsule Take 1 capsule (125 mg total) by mouth 4 (four) times daily for 10 days, THEN 1 capsule (125 mg total) in the morning, at noon, and at bedtime for 7 days, THEN 1 capsule (125 mg total) in the morning and at bedtime for 7 days, THEN 1 capsule (125 mg total) daily for 7 days. (Patient not taking: Reported on 12/24/2023)   Facility-Administered Medications Prior to Visit  Medication Dose Route Frequency Provider   0.9 %  sodium chloride infusion   Intravenous Continuous Ennever, Rose Phi, MD   acetaminophen (TYLENOL) tablet 650 mg  650 mg Oral Once Josph Macho, MD   nitroGLYCERIN (NITROSTAT) SL tablet 0.4 mg  0.4 mg Sublingual Once Danise Edge A, MD   sodium chloride flush (NS) 0.9 % injection 10 mL  10 mL Intracatheter PRN Josph Macho, MD    Review of Systems {Insert previous labs (optional):23779} {See past labs  Heme   Chem  Endocrine  Serology  Results Review (optional):1}   Objective    There were no vitals taken for this visit. {Insert last BP/Wt (optional):23777}{See vitals history (optional):1}  Physical Exam  ***  Results for orders placed or performed in visit on 12/24/23  Lactate dehydrogenase (LDH)  Result Value Ref Range   LDH 193 (H) 98 - 192 U/L  CMP (Cancer Center only)  Result Value Ref Range   Sodium 142 135 - 145 mmol/L   Potassium 3.5 3.5 - 5.1 mmol/L   Chloride 107 98 - 111 mmol/L   CO2 25 22 - 32 mmol/L   Glucose, Bld 124 (H) 70 - 99 mg/dL   BUN 17 8 - 23 mg/dL   Creatinine  0.72 0.44 - 1.00 mg/dL   Calcium 9.5 8.9 - 16.1 mg/dL   Total Protein 6.8 6.5 - 8.1 g/dL   Albumin 4.1 3.5 - 5.0 g/dL   AST 20 15 - 41 U/L   ALT 13 0 - 44 U/L   Alkaline Phosphatase 51 38 - 126 U/L   Total Bilirubin 0.5 0.0 - 1.2 mg/dL   GFR, Estimated >09 >60 mL/min   Anion gap 10 5 - 15  CBC with Differential (Cancer Center Only)  Result Value Ref Range   WBC Count 3.8 (L) 4.0 - 10.5 K/uL   RBC 4.04 3.87 - 5.11 MIL/uL   Hemoglobin 12.1 12.0 - 15.0 g/dL   HCT 45.4 09.8 - 11.9 %   MCV 90.6 80.0 - 100.0 fL   MCH 30.0 26.0 - 34.0 pg   MCHC 33.1 30.0 - 36.0 g/dL   RDW 14.7 82.9 - 56.2 %   Platelet Count 122 (L) 150 - 400 K/uL   nRBC 0.0 0.0 - 0.2 %   Neutrophils Relative % 66 %   Neutro Abs 2.5 1.7 - 7.7 K/uL   Lymphocytes Relative 22 %   Lymphs Abs 0.8 0.7 - 4.0 K/uL   Monocytes Relative 9 %   Monocytes Absolute 0.3 0.1 - 1.0 K/uL   Eosinophils Relative 2 %   Eosinophils Absolute 0.1 0.0 - 0.5 K/uL   Basophils Relative 1 %   Basophils Absolute 0.1 0.0 - 0.1 K/uL   Immature Granulocytes 0 %   Abs Immature Granulocytes 0.01 0.00 - 0.07 K/uL    Assessment & Plan    There are no diagnoses linked to this encounter.  ***  No follow-ups on file.       Alfredia Ferguson, PA-C  [facility] [phone] (phone) [phone] (fax)  [facility]

## 2023-12-25 LAB — URINE CULTURE
MICRO NUMBER:: 16086282
SPECIMEN QUALITY:: ADEQUATE

## 2023-12-26 ENCOUNTER — Encounter: Payer: Self-pay | Admitting: Family Medicine

## 2023-12-26 LAB — IGG, IGA, IGM
IgA: 28 mg/dL — ABNORMAL LOW (ref 87–352)
IgG (Immunoglobin G), Serum: 976 mg/dL (ref 586–1602)
IgM (Immunoglobulin M), Srm: 19 mg/dL — ABNORMAL LOW (ref 26–217)

## 2023-12-26 LAB — CLOSTRIDIUM DIFFICILE BY PCR: Toxigenic C. Difficile by PCR: NEGATIVE

## 2023-12-27 ENCOUNTER — Encounter: Payer: Self-pay | Admitting: Family Medicine

## 2023-12-27 ENCOUNTER — Telehealth: Payer: Self-pay | Admitting: Family Medicine

## 2023-12-27 ENCOUNTER — Telehealth: Payer: Self-pay

## 2023-12-27 ENCOUNTER — Ambulatory Visit: Payer: 59 | Admitting: Family Medicine

## 2023-12-27 ENCOUNTER — Ambulatory Visit: Payer: Self-pay

## 2023-12-27 DIAGNOSIS — R1013 Epigastric pain: Secondary | ICD-10-CM | POA: Diagnosis not present

## 2023-12-27 LAB — CERVICOVAGINAL ANCILLARY ONLY
Bacterial Vaginitis (gardnerella): POSITIVE — AB
Candida Glabrata: NEGATIVE
Candida Vaginitis: NEGATIVE
Chlamydia: NEGATIVE
Comment: NEGATIVE
Comment: NEGATIVE
Comment: NEGATIVE
Comment: NEGATIVE
Comment: NORMAL
Neisseria Gonorrhea: NEGATIVE

## 2023-12-27 LAB — PROTEIN ELECTROPHORESIS, SERUM
A/G Ratio: 1.2 (ref 0.7–1.7)
Albumin ELP: 3.7 g/dL (ref 2.9–4.4)
Alpha-1-Globulin: 0.3 g/dL (ref 0.0–0.4)
Alpha-2-Globulin: 0.9 g/dL (ref 0.4–1.0)
Beta Globulin: 1 g/dL (ref 0.7–1.3)
Gamma Globulin: 0.8 g/dL (ref 0.4–1.8)
Globulin, Total: 3 g/dL (ref 2.2–3.9)
M-Spike, %: 0.4 g/dL — ABNORMAL HIGH
Total Protein ELP: 6.7 g/dL (ref 6.0–8.5)

## 2023-12-27 LAB — KAPPA/LAMBDA LIGHT CHAINS
Kappa free light chain: 5.9 mg/L (ref 3.3–19.4)
Kappa, lambda light chain ratio: 3.28 — ABNORMAL HIGH (ref 0.26–1.65)
Lambda free light chains: 1.8 mg/L — ABNORMAL LOW (ref 5.7–26.3)

## 2023-12-27 NOTE — Patient Outreach (Signed)
  Care Coordination   Follow Up Visit Note   12/27/2023 Name: Tami [name] MRN: [id number] DOB: [birth year]  Tami [name] is a 68 y.o. year old female who sees [name], MD for primary care. I spoke with  [name] by phone today.  What matters to the patients health and wellness today?  Patient needs personal care.    Goals Addressed             This Visit's Progress    Care Coordination Activities       Interventions Today    Flowsheet Row Most Recent Value  Chronic Disease   Chronic disease during today's visit Congestive Heart Failure (CHF), Other  [Stroke, cancer]  General Interventions   General Interventions Discussed/Reviewed General Interventions Discussed, General Interventions Reviewed, Communication with  [Pt POA reports PCS services 2024 ended.Pt was not satisfied with service.CAP application waiting for assessment.Pt reports no one has contacted her for PCS.Pt agreed to select a new provider. SW emailed local PCS agencies to POA.]  Communication with --  [SW t/c [facility] to update on PCS. PCS application is still active.Pt will need to select a new provider. Current schedule is M-Th for 2 hrs daily. Updated assessment due in April.Hours could be increased.Staff to mail list of new agencies to select.]              SDOH assessments and interventions completed:  No     Care Coordination Interventions:  Yes, provided   Follow up plan: No further intervention required.   Encounter Outcome:  Patient Visit Completed

## 2023-12-27 NOTE — Patient Instructions (Signed)
Visit Information  Thank you for taking time to visit with me today. Please don't hesitate to contact me if I can be of assistance to you.   Following are the goals we discussed today:  Patient and POA will select a new PCS provider and contact [facility].    If you are experiencing a Mental Health or Behavioral Health Crisis or need someone to talk to, please call 911  Patient verbalizes understanding of instructions and care plan provided today and agrees to view in MyChart. Active MyChart status and patient understanding of how to access instructions and care plan via MyChart confirmed with patient.     No further follow up required: Patient does not request a follow up visit.  Lysle Morales, BSW [place]  [facility], [facility] Social Worker Direct Dial: [phone]  Fax: [phone] Website: [email or web address]

## 2023-12-27 NOTE — Telephone Encounter (Signed)
Please Advise Copied from CRM [id number]. Topic: Clinical - Medical Advice >> Dec 27, 2023  2:58 PM Isabell A wrote: Reason for CRM: Patient states she was in the office on Friday, she was told to do a urine sample - she was prescribed ciprofloxacin (CIPRO) 250 MG tablet only for 6 days. Patient states she is still experiencing pain down there. Would like further assistance on what she should do.

## 2023-12-27 NOTE — Telephone Encounter (Signed)
Pt will like for you to call her at your earliest convenience.Marland KitchenMarland Kitchen

## 2023-12-28 ENCOUNTER — Emergency Department (HOSPITAL_BASED_OUTPATIENT_CLINIC_OR_DEPARTMENT_OTHER)
Admission: EM | Admit: 2023-12-28 | Discharge: 2023-12-28 | Disposition: A | Discharge status: Home / Self Care | Payer: 59 | Attending: Emergency Medicine | Admitting: Emergency Medicine

## 2023-12-28 ENCOUNTER — Other Ambulatory Visit: Payer: Self-pay

## 2023-12-28 ENCOUNTER — Ambulatory Visit: Payer: 59 | Admitting: Family Medicine

## 2023-12-28 ENCOUNTER — Encounter (HOSPITAL_BASED_OUTPATIENT_CLINIC_OR_DEPARTMENT_OTHER): Payer: Self-pay | Admitting: *Deleted

## 2023-12-28 ENCOUNTER — Other Ambulatory Visit: Payer: Self-pay | Admitting: *Deleted

## 2023-12-28 DIAGNOSIS — Z79899 Other long term (current) drug therapy: Secondary | ICD-10-CM | POA: Insufficient documentation

## 2023-12-28 DIAGNOSIS — R197 Diarrhea, unspecified: Secondary | ICD-10-CM | POA: Insufficient documentation

## 2023-12-28 DIAGNOSIS — Z7982 Long term (current) use of aspirin: Secondary | ICD-10-CM | POA: Insufficient documentation

## 2023-12-28 DIAGNOSIS — Z20822 Contact with and (suspected) exposure to covid-19: Secondary | ICD-10-CM | POA: Insufficient documentation

## 2023-12-28 LAB — COMPREHENSIVE METABOLIC PANEL
ALT: 18 U/L (ref 0–44)
AST: 25 U/L (ref 15–41)
Albumin: 3.8 g/dL (ref 3.5–5.0)
Alkaline Phosphatase: 47 U/L (ref 38–126)
Anion gap: 10 (ref 5–15)
BUN: 18 mg/dL (ref 8–23)
CO2: 24 mmol/L (ref 22–32)
Calcium: 9.3 mg/dL (ref 8.9–10.3)
Chloride: 105 mmol/L (ref 98–111)
Creatinine, Ser: 0.81 mg/dL (ref 0.44–1.00)
GFR, Estimated: >60 mL/min (ref >60)
Glucose, Bld: 137 mg/dL — ABNORMAL HIGH (ref 70–99)
Potassium: 3.7 mmol/L (ref 3.5–5.1)
Sodium: 139 mmol/L (ref 135–145)
Total Bilirubin: 0.5 mg/dL (ref 0.0–1.2)
Total Protein: 7.1 g/dL (ref 6.5–8.1)

## 2023-12-28 LAB — URINALYSIS, ROUTINE W REFLEX MICROSCOPIC
Bilirubin Urine: NEGATIVE
Glucose, UA: NEGATIVE mg/dL
Hgb urine dipstick: NEGATIVE
Ketones, ur: NEGATIVE mg/dL
Nitrite: NEGATIVE
Protein, ur: NEGATIVE mg/dL
Specific Gravity, Urine: 1.025 (ref 1.005–1.030)
pH: 5.5 (ref 5.0–8.0)

## 2023-12-28 LAB — CBC
HCT: 36.7 % (ref 36.0–46.0)
Hemoglobin: 12.1 g/dL (ref 12.0–15.0)
MCH: 30 pg (ref 26.0–34.0)
MCHC: 33 g/dL (ref 30.0–36.0)
MCV: 90.8 fL (ref 80.0–100.0)
Platelets: 154 10*3/uL (ref 150–400)
RBC: 4.04 MIL/uL (ref 3.87–5.11)
RDW: 13.4 % (ref 11.5–15.5)
WBC: 4.4 10*3/uL (ref 4.0–10.5)
nRBC: 0 % (ref 0.0–0.2)

## 2023-12-28 LAB — URINALYSIS, MICROSCOPIC (REFLEX)

## 2023-12-28 LAB — RESP PANEL BY RT-PCR (RSV, FLU A&B, COVID)  RVPGX2
Influenza A by PCR: NEGATIVE
Influenza B by PCR: NEGATIVE
Resp Syncytial Virus by PCR: NEGATIVE
SARS Coronavirus 2 by RT PCR: NEGATIVE

## 2023-12-28 LAB — LIPASE, BLOOD: Lipase: 28 U/L (ref 11–51)

## 2023-12-28 MED ORDER — SODIUM CHLORIDE 0.9 % IV BOLUS
1000.0000 mL | Freq: Once | INTRAVENOUS | Status: AC
  Administered 2023-12-28: 1000 mL via INTRAVENOUS

## 2023-12-28 MED ORDER — METRONIDAZOLE 500 MG PO TABS
500.0000 mg | ORAL_TABLET | Freq: Two times a day (BID) | ORAL | 0 refills | Status: AC
Start: 2023-12-28 — End: 2024-01-04

## 2023-12-28 NOTE — Telephone Encounter (Signed)
Patient wanted to let us know that she was still having urinary issue and pain in pelvic area.  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  She would like to do medications first and if she does not do better she will call back about gyn.  She is also having some gi issues and I advised that she will need to call gastro about those issues.  Pt agreed and will call.

## 2023-12-28 NOTE — Telephone Encounter (Signed)
Called and spoke with patient. She said she would like the GYN referral. She is currently in the ED downstairs to get some more IV fluids. Still experiencing diarrhea and weakness. She said she picked up her antibiotics this morning and has taken her first. Will let us know what he ER physician says

## 2023-12-28 NOTE — ED Provider Notes (Signed)
[facility] Provider Note   CSN: [id number] Arrival date & time: 12/28/23  1540     History  Chief Complaint  Patient presents with   Diarrhea    Tami [name] is a 68 y.o. female.  69 yo F with a chief complaint of diarrhea.  This been an ongoing issue for her.  She says since Sunday this week is gotten quite a bit worse.  She has this happen to her from time to time.  Has been treated for C. difficile in the past.  She tells me that the infectious disease providers are trying to get her lined up a fecal transplant.     Diarrhea      Home Medications Prior to Admission medications   Medication Sig Start Date End Date Taking? Authorizing Provider  acetaminophen (TYLENOL) 500 MG tablet Take 500 mg by mouth every 6 (six) hours as needed.    [provider]  acyclovir (ZOVIRAX) 400 MG tablet Take 1 tablet (400 mg total) by mouth 2 (two) times daily. 04/22/23   Josph Macho, MD  albuterol (PROVENTIL) (2.5 MG/3ML) 0.083% nebulizer solution Take 3 mLs (2.5 mg total) by nebulization every 6 (six) hours as needed for wheezing or shortness of breath. 12/14/23   Martina Sinner, MD  albuterol (VENTOLIN HFA) 108 (90 Base) MCG/ACT inhaler Inhale 2 puffs into the lungs every 6 (six) hours as needed for wheezing or shortness of breath. 12/14/23   Martina Sinner, MD  ALPRAZolam Prudy Feeler) 0.25 MG tablet Take 1-2 tablets (0.25-0.5 mg total) by mouth 3 (three) times daily as needed for anxiety. 06/14/23   [name], MD  amLODipine (NORVASC) 5 MG tablet Take 5 mg by mouth daily. 09/14/23   [provider]  aspirin EC 81 MG tablet Take 81 mg by mouth daily. Swallow whole.    [provider]  budesonide (PULMICORT) 0.5 MG/2ML nebulizer solution Take 2 mLs (0.5 mg total) by nebulization 2 (two) times daily as needed (cough, wheezing or shortness of breath). 12/14/23   Martina Sinner, MD  celecoxib (CELEBREX) 100 MG  capsule Take 100 mg by mouth 2 (two) times daily. 06/07/23   [provider]  cetirizine (ZYRTEC) 10 MG tablet Take 10 mg by mouth at bedtime.    [provider]  Cholecalciferol (VITAMIN D3) 50 MCG (2000 UT) TABS Take 2,000 Units by mouth in the morning.    [provider]  cholestyramine light (PREVALITE) 4 g packet Take 4 g by mouth 2 (two) times daily.    [provider]  dicyclomine (BENTYL) 10 MG capsule Take 1 capsule (10 mg total) by mouth 4 (four) times daily -  before meals and at bedtime. cramping 12/02/23 03/01/24  [name], MD  diphenoxylate-atropine (LOMOTIL) 2.5-0.025 MG tablet Take 1 tablet by mouth 2 (two) times daily. 05/19/23   Lynann Bologna, MD  famotidine (PEPCID) 20 MG tablet Take 1 tablet (20 mg total) by mouth at bedtime. 09/16/23   [name], MD  fidaxomicin (DIFICID) 200 MG TABS tablet Take 1 tablet (200 mg total) by mouth 2 (two) times daily. 11/25/23   Donato Schultz, DO  fluconazole (DIFLUCAN) 150 MG tablet Take 1 tablet (150 mg total) by mouth daily. May repeat in 3 days if needed. 12/24/23   Seabron Spates R, DO  fluticasone (FLONASE) 50 MCG/ACT nasal spray Place 2 sprays into both nostrils daily. 01/07/20  Joy, Shawn C, PA-C  furosemide (LASIX) 20 MG tablet Take 20 mg by mouth daily as needed (fluid retention (feet swelling)). 07/03/20   [provider]  HYDROcodone bit-homatropine (HYDROMET) 5-1.5 MG/5ML syrup Take 5 mLs by mouth every 8 (eight) hours as needed for cough. 09/20/23   [name], MD  hydrOXYzine (ATARAX) 10 MG tablet TAKE ONE (1) TABLET BY MOUTH 3 TIMES DAILY AS NEEDED FOR ITCHING 03/03/23   [name], MD  hyoscyamine (LEVSIN SL) 0.125 MG SL tablet Place 1 tablet (0.125 mg total) under the tongue every 4 (four) hours as needed. 04/27/23   [name], MD  lidocaine-prilocaine (EMLA) cream Apply 1 Application topically as needed. 07/01/23   Josph Macho, MD  loperamide (IMODIUM)  2 MG capsule Take 4 mg by mouth in the morning and at bedtime.    [provider]  losartan (COZAAR) 50 MG tablet Take 50 mg by mouth in the morning and at bedtime. 04/14/21   [provider]  Magnesium Glycinate 100 MG CAPS Take 200 mg by mouth at bedtime. 09/21/22   [provider]  meclizine (ANTIVERT) 12.5 MG tablet Take 12.5 mg by mouth 2 (two) times daily as needed for dizziness or nausea. 01/28/22   [provider]  metoprolol succinate (TOPROL-XL) 50 MG 24 hr tablet Take 1 tablet (50 mg total) by mouth 2 (two) times daily. Patient taking differently: Take 50 mg by mouth daily. 02/23/23   [name], MD  metroNIDAZOLE (FLAGYL) 500 MG tablet Take 1 tablet (500 mg total) by mouth 2 (two) times daily for 7 days. 12/28/23 01/04/24  Seabron Spates R, DO  montelukast (SINGULAIR) 10 MG tablet Take 1 tablet (10 mg total) by mouth at bedtime. Please start taking this on September 4. 07/01/23   Josph Macho, MD  Multiple Vitamins-Minerals (CENTRUM SILVER WOMEN 50+ PO) Take 1 tablet by mouth daily at 6 (six) AM.    [provider]  neomycin-polymyxin-hydrocortisone (CORTISPORIN) OTIC solution Place 4 drops into both ears 4 (four) times daily. 07/30/23   Josph Macho, MD  nortriptyline (PAMELOR) 10 MG capsule Take 1 capsule (10 mg total) by mouth at bedtime. 06/14/23   [name], MD  omeprazole (PRILOSEC) 20 MG capsule SMARTSIG:1.0 Capsule(s) By Mouth Daily 08/19/23   [provider]  ondansetron (ZOFRAN) 8 MG tablet Take 1 tablet (8 mg total) by mouth every 8 (eight) hours as needed for nausea or vomiting. 07/14/23   Josph Macho, MD  oxyCODONE-acetaminophen (PERCOCET) 5-325 MG tablet Take 2 tablets by mouth every 6 (six) hours as needed. 08/07/23   Charlynne Pander, MD  oxyCODONE-acetaminophen (PERCOCET/ROXICET) 5-325 MG tablet Take 1 tablet by mouth every 6 (six) hours as needed for severe pain. 11/07/21   [provider]   pantoprazole (PROTONIX) 40 MG tablet Take 1 tablet (40 mg total) by mouth daily. 09/03/23   Sandford Craze, NP  potassium chloride SA (KLOR-CON M) 20 MEQ tablet TAKE 1 TABLET BY MOUTH EVERY MONDAY, WEDNESDAY, AND FRIDAY. 10/25/23   [name], MD  prochlorperazine (COMPAZINE) 10 MG tablet Take 0.5 tablets (5 mg total) by mouth 2 (two) times daily as needed for nausea or vomiting. 12/02/23   [name], MD  Respiratory Therapy Supplies (PILLOW MASK/ADULT) MISC . Provide PAP supplies (expires 12 months from 09/21/2023) . 09/29/23   [provider]  Saccharomyces boulardii (FLORASTOR PO) Take 500 mg by mouth daily at 6 (six)  AM.    [provider]  scopolamine (TRANSDERM-SCOP) 1 MG/3DAYS Place 1 patch (1.5 mg total) onto the skin every 3 (three) days. 07/01/23   Josph Macho, MD  sertraline (ZOLOFT) 50 MG tablet Take 1 tablet (50 mg total) by mouth daily. 06/14/23   [name], MD  spironolactone (ALDACTONE) 25 MG tablet Take 25 mg by mouth daily.    [provider]  vancomycin (VANCOCIN) 125 MG capsule Take 1 capsule (125 mg total) by mouth 4 (four) times daily for 10 days, THEN 1 capsule (125 mg total) in the morning, at noon, and at bedtime for 7 days, THEN 1 capsule (125 mg total) in the morning and at bedtime for 7 days, THEN 1 capsule (125 mg total) daily for 7 days. Patient not taking: Reported on 12/24/2023 12/02/23 01/02/24  [name], MD      Allergies    Benazepril, Ondansetron hcl, Codeine, and Morphine    Review of Systems   Review of Systems  Gastrointestinal:  Positive for diarrhea.    Physical Exam Updated Vital Signs BP (!) 162/98   Pulse 85   Temp 98.1 F (36.7 C) (Oral)   Resp 14   SpO2 98%  Physical Exam Vitals and nursing note reviewed.  Constitutional:      General: She is not in acute distress.    Appearance: She is well-developed. She is not diaphoretic.  HENT:     Head: Normocephalic and atraumatic.  Eyes:      Pupils: Pupils are equal, round, and reactive to light.  Cardiovascular:     Rate and Rhythm: Normal rate and regular rhythm.     Heart sounds: No murmur heard.    No friction rub. No gallop.  Pulmonary:     Effort: Pulmonary effort is normal.     Breath sounds: No wheezing or rales.  Abdominal:     General: There is no distension.     Palpations: Abdomen is soft.     Tenderness: There is no abdominal tenderness.  Musculoskeletal:        General: No tenderness.     Cervical back: Normal range of motion and neck supple.  Skin:    General: Skin is warm and dry.  Neurological:     Mental Status: She is alert and oriented to person, place, and time.  Psychiatric:        Behavior: Behavior normal.     ED Results / Procedures / Treatments   Labs (all labs ordered are listed, but only abnormal results are displayed) Labs Reviewed  COMPREHENSIVE METABOLIC PANEL - Abnormal; Notable for the following components:      Result Value   Glucose, Bld 137 (*)    All other components within normal limits  URINALYSIS, ROUTINE W REFLEX MICROSCOPIC - Abnormal; Notable for the following components:   Leukocytes,Ua SMALL (*)    All other components within normal limits  URINALYSIS, MICROSCOPIC (REFLEX) - Abnormal; Notable for the following components:   Bacteria, UA RARE (*)    All other components within normal limits  RESP PANEL BY RT-PCR (RSV, FLU A&B, COVID)  RVPGX2  LIPASE, BLOOD  CBC    EKG None  Radiology No results found.  Procedures Procedures    Medications Ordered in ED Medications  sodium chloride 0.9 % bolus 1,000 mL (0 mLs Intravenous Stopped 12/28/23 2031)    ED Course/ Medical Decision Making/ A&P  Medical Decision Making Amount and/or Complexity of Data Reviewed Labs: ordered.   68 yo F with a chief complaints of diarrhea.  This is a chronic problem for her unfortunately.  She says she has gotten so rundown now that she  needs IV fluids.  Was seen by her oncologist on Friday and had fluids given.  No fevers no dark stool.  UA negative for infection.  No significant electrolyte abnormalities.  Renal function at baseline.  LFTs and lipase are unremarkable.  COVID flu and RSV are negative.  Feeling mildly better.  D/c home.   9:39 PM:  I have discussed the diagnosis/risks/treatment options with the patient.  Evaluation and diagnostic testing in the emergency department does not suggest an emergent condition requiring admission or immediate intervention beyond what has been performed at this time.  They will follow up with PCP. We also discussed returning to the ED immediately if new or worsening sx occur. We discussed the sx which are most concerning (e.g., sudden worsening pain, fever, inability to tolerate by mouth) that necessitate immediate return. Medications administered to the patient during their visit and any new prescriptions provided to the patient are listed below.  Medications given during this visit Medications  sodium chloride 0.9 % bolus 1,000 mL (0 mLs Intravenous Stopped 12/28/23 2031)     The patient appears reasonably screen and/or stabilized for discharge and I doubt any other medical condition or other [facility] requiring further screening, evaluation, or treatment in the ED at this time prior to discharge.          Final Clinical Impression(s) / ED Diagnoses Final diagnoses:  Diarrhea, unspecified type    Rx / DC Orders ED Discharge Orders     None         Melene Plan, DO 12/28/23 2139

## 2023-12-28 NOTE — ED Triage Notes (Signed)
Pt states that she has had n/v/d since this weekend and that she feels like she has a UTI also.  Pt states that she has had 3-4 loose stools a day.

## 2023-12-28 NOTE — Discharge Instructions (Signed)
Follow up with your PCP and oncologist.

## 2023-12-29 ENCOUNTER — Encounter: Payer: Self-pay | Admitting: Family Medicine

## 2023-12-29 ENCOUNTER — Other Ambulatory Visit: Payer: Self-pay | Admitting: Family Medicine

## 2023-12-29 DIAGNOSIS — N390 Urinary tract infection, site not specified: Secondary | ICD-10-CM

## 2023-12-29 DIAGNOSIS — R102 Pelvic and perineal pain: Secondary | ICD-10-CM

## 2023-12-30 ENCOUNTER — Telehealth: Payer: Self-pay | Admitting: *Deleted

## 2023-12-30 DIAGNOSIS — R29898 Other symptoms and signs involving the musculoskeletal system: Secondary | ICD-10-CM

## 2023-12-30 NOTE — Telephone Encounter (Signed)
Rx sent over to [facility] at [phone] and patient notified.

## 2023-12-30 NOTE — Telephone Encounter (Signed)
Copied from CRM [id number]. Topic: Clinical - Prescription Issue >> Dec 29, 2023  4:01 PM Denese Killings wrote: Reason for CRM: Tami [name] with [facility] wants a prescription for a raised toilet lid sent to [facility] in [place].  Phone number: [phone]

## 2023-12-30 NOTE — Transitions of Care (Post Inpatient/ED Visit) (Signed)
   12/30/2023  Name: Tami [name] MRN: [id number] DOB: 10/21/1956  Today's TOC FU Call Status: Today's TOC FU Call Status:: Unsuccessful Call (1st Attempt) Unsuccessful Call (1st Attempt) Date: 12/30/23  Attempted to reach the patient regarding the most recent Inpatient/ED visit.  Follow Up Plan: Additional outreach attempts will be made to reach the patient to complete the Transitions of Care (Post Inpatient/ED visit) call.   Signature Raechal Raben, [facility]

## 2023-12-31 ENCOUNTER — Telehealth: Payer: Self-pay | Admitting: *Deleted

## 2023-12-31 ENCOUNTER — Encounter: Payer: Self-pay | Admitting: Obstetrics

## 2023-12-31 DIAGNOSIS — R197 Diarrhea, unspecified: Secondary | ICD-10-CM | POA: Diagnosis not present

## 2023-12-31 DIAGNOSIS — K529 Noninfective gastroenteritis and colitis, unspecified: Secondary | ICD-10-CM | POA: Diagnosis not present

## 2023-12-31 NOTE — Transitions of Care (Post Inpatient/ED Visit) (Signed)
   12/31/2023  Name: Tami [name] MRN: [id number] DOB: Feb 07, 1956  Today's TOC FU Call Status: Today's TOC FU Call Status:: Unsuccessful Call (2nd Attempt) Unsuccessful Call (2nd Attempt) Date: 12/31/23  Attempted to reach the patient regarding the most recent Inpatient/ED visit.  Follow Up Plan: No further outreach attempts will be made at this time. We have been unable to contact the patient.  Signature Lenox Ladouceur, [facility]

## 2024-01-01 ENCOUNTER — Other Ambulatory Visit: Payer: Self-pay

## 2024-01-03 ENCOUNTER — Emergency Department (HOSPITAL_BASED_OUTPATIENT_CLINIC_OR_DEPARTMENT_OTHER): Payer: 59

## 2024-01-03 ENCOUNTER — Encounter (HOSPITAL_BASED_OUTPATIENT_CLINIC_OR_DEPARTMENT_OTHER): Payer: Self-pay | Admitting: Emergency Medicine

## 2024-01-03 ENCOUNTER — Emergency Department (HOSPITAL_BASED_OUTPATIENT_CLINIC_OR_DEPARTMENT_OTHER)
Admission: EM | Admit: 2024-01-03 | Discharge: 2024-01-03 | Disposition: A | Discharge status: Home / Self Care | Payer: 59 | Attending: Emergency Medicine | Admitting: Emergency Medicine

## 2024-01-03 ENCOUNTER — Other Ambulatory Visit: Payer: Self-pay

## 2024-01-03 DIAGNOSIS — E119 Type 2 diabetes mellitus without complications: Secondary | ICD-10-CM | POA: Insufficient documentation

## 2024-01-03 DIAGNOSIS — Z79899 Other long term (current) drug therapy: Secondary | ICD-10-CM | POA: Insufficient documentation

## 2024-01-03 DIAGNOSIS — M4726 Other spondylosis with radiculopathy, lumbar region: Secondary | ICD-10-CM | POA: Diagnosis not present

## 2024-01-03 DIAGNOSIS — M546 Pain in thoracic spine: Secondary | ICD-10-CM | POA: Diagnosis not present

## 2024-01-03 DIAGNOSIS — S299XXA Unspecified injury of thorax, initial encounter: Secondary | ICD-10-CM | POA: Diagnosis not present

## 2024-01-03 DIAGNOSIS — Z7982 Long term (current) use of aspirin: Secondary | ICD-10-CM | POA: Insufficient documentation

## 2024-01-03 DIAGNOSIS — I10 Essential (primary) hypertension: Secondary | ICD-10-CM | POA: Insufficient documentation

## 2024-01-03 DIAGNOSIS — M549 Dorsalgia, unspecified: Secondary | ICD-10-CM

## 2024-01-03 DIAGNOSIS — M62830 Muscle spasm of back: Secondary | ICD-10-CM | POA: Diagnosis not present

## 2024-01-03 DIAGNOSIS — M545 Low back pain, unspecified: Secondary | ICD-10-CM | POA: Diagnosis not present

## 2024-01-03 DIAGNOSIS — S3992XA Unspecified injury of lower back, initial encounter: Secondary | ICD-10-CM | POA: Diagnosis not present

## 2024-01-03 DIAGNOSIS — Z9049 Acquired absence of other specified parts of digestive tract: Secondary | ICD-10-CM | POA: Diagnosis not present

## 2024-01-03 DIAGNOSIS — I517 Cardiomegaly: Secondary | ICD-10-CM | POA: Diagnosis not present

## 2024-01-03 DIAGNOSIS — M62838 Other muscle spasm: Secondary | ICD-10-CM

## 2024-01-03 LAB — CBC WITH DIFFERENTIAL/PLATELET
Abs Immature Granulocytes: 0.01 10*3/uL (ref 0.00–0.07)
Basophils Absolute: 0 10*3/uL (ref 0.0–0.1)
Basophils Relative: 1 %
Eosinophils Absolute: 0.1 10*3/uL (ref 0.0–0.5)
Eosinophils Relative: 2 %
HCT: 37.7 % (ref 36.0–46.0)
Hemoglobin: 12.3 g/dL (ref 12.0–15.0)
Immature Granulocytes: 0 %
Lymphocytes Relative: 28 %
Lymphs Abs: 1.2 10*3/uL (ref 0.7–4.0)
MCH: 29.8 pg (ref 26.0–34.0)
MCHC: 32.6 g/dL (ref 30.0–36.0)
MCV: 91.3 fL (ref 80.0–100.0)
Monocytes Absolute: 0.3 10*3/uL (ref 0.1–1.0)
Monocytes Relative: 8 %
Neutro Abs: 2.6 10*3/uL (ref 1.7–7.7)
Neutrophils Relative %: 61 %
Platelets: 167 10*3/uL (ref 150–400)
RBC: 4.13 MIL/uL (ref 3.87–5.11)
RDW: 13.4 % (ref 11.5–15.5)
WBC: 4.2 10*3/uL (ref 4.0–10.5)
nRBC: 0 % (ref 0.0–0.2)

## 2024-01-03 LAB — COMPREHENSIVE METABOLIC PANEL
ALT: 16 U/L (ref 0–44)
AST: 25 U/L (ref 15–41)
Albumin: 3.7 g/dL (ref 3.5–5.0)
Alkaline Phosphatase: 50 U/L (ref 38–126)
Anion gap: 10 (ref 5–15)
BUN: 20 mg/dL (ref 8–23)
CO2: 22 mmol/L (ref 22–32)
Calcium: 9.3 mg/dL (ref 8.9–10.3)
Chloride: 109 mmol/L (ref 98–111)
Creatinine, Ser: 0.7 mg/dL (ref 0.44–1.00)
GFR, Estimated: >60 mL/min (ref >60)
Glucose, Bld: 115 mg/dL — ABNORMAL HIGH (ref 70–99)
Potassium: 3.9 mmol/L (ref 3.5–5.1)
Sodium: 141 mmol/L (ref 135–145)
Total Bilirubin: 0.6 mg/dL (ref 0.0–1.2)
Total Protein: 6.9 g/dL (ref 6.5–8.1)

## 2024-01-03 LAB — LIPASE, BLOOD: Lipase: 31 U/L (ref 11–51)

## 2024-01-03 MED ORDER — SODIUM CHLORIDE 0.9 % IV BOLUS: 1000.0000 mL | Freq: Once | INTRAVENOUS | Status: DC

## 2024-01-03 MED ORDER — ACETAMINOPHEN 325 MG PO TABS
650.0000 mg | ORAL_TABLET | Freq: Once | ORAL | Status: AC
  Administered 2024-01-03: 650 mg via ORAL
  Filled 2024-01-03: qty 2

## 2024-01-03 MED ORDER — CYCLOBENZAPRINE HCL 5 MG PO TABS
5.0000 mg | ORAL_TABLET | Freq: Two times a day (BID) | ORAL | 0 refills | Status: AC | PRN
Start: 2024-01-03 — End: ?

## 2024-01-03 MED ORDER — LIDOCAINE 5 % EX PTCH
1.0000 | MEDICATED_PATCH | CUTANEOUS | Status: DC
  Administered 2024-01-03: 1 via TRANSDERMAL
  Filled 2024-01-03: qty 1

## 2024-01-03 NOTE — ED Provider Notes (Signed)
 [facility] Provider Note   CSN: [id number] Arrival date & time: 01/03/24  1425     History  Chief Complaint  Patient presents with   Back Pain    Tami [name] is a 68 y.o. female.  Patient here with upper back and low back pain as her main concern.  She has chronic diarrhea.  She denies any fall.  Family is worried that she could have compression fracture in her back.  She has a history of MGUS hypertension and diabetes.  She has been using some over-the-counter medications without much help.  She denies any weakness numbness tingling.  No headache chest pain abdominal pain.  The history is provided by the patient.       Home Medications Prior to Admission medications   Medication Sig Start Date End Date Taking? Authorizing Provider  cyclobenzaprine (FLEXERIL) 5 MG tablet Take 1 tablet (5 mg total) by mouth 2 (two) times daily as needed for up to 10 doses for muscle spasms. 01/03/24  Yes Mary Secord, DO  acetaminophen (TYLENOL) 500 MG tablet Take 500 mg by mouth every 6 (six) hours as needed.    [provider]  acyclovir (ZOVIRAX) 400 MG tablet Take 1 tablet (400 mg total) by mouth 2 (two) times daily. 04/22/23   Josph Macho, MD  albuterol (PROVENTIL) (2.5 MG/3ML) 0.083% nebulizer solution Take 3 mLs (2.5 mg total) by nebulization every 6 (six) hours as needed for wheezing or shortness of breath. 12/14/23   Martina Sinner, MD  albuterol (VENTOLIN HFA) 108 (90 Base) MCG/ACT inhaler Inhale 2 puffs into the lungs every 6 (six) hours as needed for wheezing or shortness of breath. 12/14/23   Martina Sinner, MD  ALPRAZolam Prudy Feeler) 0.25 MG tablet Take 1-2 tablets (0.25-0.5 mg total) by mouth 3 (three) times daily as needed for anxiety. 06/14/23   [name], MD  amLODipine (NORVASC) 5 MG tablet Take 5 mg by mouth daily. 09/14/23   [provider]  aspirin EC 81 MG tablet Take 81 mg by mouth daily. Swallow  whole.    [provider]  budesonide (PULMICORT) 0.5 MG/2ML nebulizer solution Take 2 mLs (0.5 mg total) by nebulization 2 (two) times daily as needed (cough, wheezing or shortness of breath). 12/14/23   Martina Sinner, MD  celecoxib (CELEBREX) 100 MG capsule Take 100 mg by mouth 2 (two) times daily. 06/07/23   [provider]  cetirizine (ZYRTEC) 10 MG tablet Take 10 mg by mouth at bedtime.    [provider]  Cholecalciferol (VITAMIN D3) 50 MCG (2000 UT) TABS Take 2,000 Units by mouth in the morning.    [provider]  cholestyramine light (PREVALITE) 4 g packet Take 4 g by mouth 2 (two) times daily.    [provider]  dicyclomine (BENTYL) 10 MG capsule Take 1 capsule (10 mg total) by mouth 4 (four) times daily -  before meals and at bedtime. cramping 12/02/23 03/01/24  [name], MD  diphenoxylate-atropine (LOMOTIL) 2.5-0.025 MG tablet Take 1 tablet by mouth 2 (two) times daily. 05/19/23   Lynann Bologna, MD  famotidine (PEPCID) 20 MG tablet Take 1 tablet (20 mg total) by mouth at bedtime. 09/16/23   [name], MD  fidaxomicin (DIFICID) 200 MG TABS tablet Take 1 tablet (200 mg total) by mouth 2 (two) times daily. 11/25/23   Seabron Spates R, DO  fluconazole (DIFLUCAN) 150 MG tablet Take  1 tablet (150 mg total) by mouth daily. May repeat in 3 days if needed. 12/24/23   Seabron Spates R, DO  fluticasone (FLONASE) 50 MCG/ACT nasal spray Place 2 sprays into both nostrils daily. 01/07/20   Joy, Shawn C, PA-C  furosemide (LASIX) 20 MG tablet Take 20 mg by mouth daily as needed (fluid retention (feet swelling)). 07/03/20   [provider]  HYDROcodone bit-homatropine (HYDROMET) 5-1.5 MG/5ML syrup Take 5 mLs by mouth every 8 (eight) hours as needed for cough. 09/20/23   [name], MD  hydrOXYzine (ATARAX) 10 MG tablet TAKE ONE (1) TABLET BY MOUTH 3 TIMES DAILY AS NEEDED FOR ITCHING 03/03/23   [name], MD  hyoscyamine  (LEVSIN SL) 0.125 MG SL tablet Place 1 tablet (0.125 mg total) under the tongue every 4 (four) hours as needed. 04/27/23   [name], MD  lidocaine-prilocaine (EMLA) cream Apply 1 Application topically as needed. 07/01/23   Josph Macho, MD  loperamide (IMODIUM) 2 MG capsule Take 4 mg by mouth in the morning and at bedtime.    [provider]  losartan (COZAAR) 50 MG tablet Take 50 mg by mouth in the morning and at bedtime. 04/14/21   [provider]  Magnesium Glycinate 100 MG CAPS Take 200 mg by mouth at bedtime. 09/21/22   [provider]  meclizine (ANTIVERT) 12.5 MG tablet Take 12.5 mg by mouth 2 (two) times daily as needed for dizziness or nausea. 01/28/22   [provider]  metoprolol succinate (TOPROL-XL) 50 MG 24 hr tablet Take 1 tablet (50 mg total) by mouth 2 (two) times daily. Patient taking differently: Take 50 mg by mouth daily. 02/23/23   [name], MD  metroNIDAZOLE (FLAGYL) 500 MG tablet Take 1 tablet (500 mg total) by mouth 2 (two) times daily for 7 days. 12/28/23 01/04/24  Seabron Spates R, DO  montelukast (SINGULAIR) 10 MG tablet Take 1 tablet (10 mg total) by mouth at bedtime. Please start taking this on September 4. 07/01/23   Josph Macho, MD  Multiple Vitamins-Minerals (CENTRUM SILVER WOMEN 50+ PO) Take 1 tablet by mouth daily at 6 (six) AM.    [provider]  neomycin-polymyxin-hydrocortisone (CORTISPORIN) OTIC solution Place 4 drops into both ears 4 (four) times daily. 07/30/23   Josph Macho, MD  nortriptyline (PAMELOR) 10 MG capsule Take 1 capsule (10 mg total) by mouth at bedtime. 06/14/23   [name], MD  omeprazole (PRILOSEC) 20 MG capsule SMARTSIG:1.0 Capsule(s) By Mouth Daily 08/19/23   [provider]  ondansetron (ZOFRAN) 8 MG tablet Take 1 tablet (8 mg total) by mouth every 8 (eight) hours as needed for nausea or vomiting. 07/14/23   Josph Macho, MD  oxyCODONE-acetaminophen (PERCOCET)  5-325 MG tablet Take 2 tablets by mouth every 6 (six) hours as needed. 08/07/23   Charlynne Pander, MD  oxyCODONE-acetaminophen (PERCOCET/ROXICET) 5-325 MG tablet Take 1 tablet by mouth every 6 (six) hours as needed for severe pain. 11/07/21   [provider]  pantoprazole (PROTONIX) 40 MG tablet Take 1 tablet (40 mg total) by mouth daily. 09/03/23   Sandford Craze, NP  potassium chloride SA (KLOR-CON M) 20 MEQ tablet TAKE 1 TABLET BY MOUTH EVERY MONDAY, WEDNESDAY, AND FRIDAY. 10/25/23   [name], MD  prochlorperazine (COMPAZINE) 10 MG tablet Take 0.5 tablets (5 mg total) by mouth 2 (two) times daily as needed for nausea or vomiting. 12/02/23   Abner Greenspan,  Bryon Lions, MD  Respiratory Therapy Supplies (PILLOW MASK/ADULT) MISC . Provide PAP supplies (expires 12 months from 09/21/2023) . 09/29/23   [provider]  Saccharomyces boulardii (FLORASTOR PO) Take 500 mg by mouth daily at 6 (six) AM.    [provider]  scopolamine (TRANSDERM-SCOP) 1 MG/3DAYS Place 1 patch (1.5 mg total) onto the skin every 3 (three) days. 07/01/23   Josph Macho, MD  sertraline (ZOLOFT) 50 MG tablet Take 1 tablet (50 mg total) by mouth daily. 06/14/23   [name], MD  spironolactone (ALDACTONE) 25 MG tablet Take 25 mg by mouth daily.    [provider]      Allergies    Benazepril, Ondansetron hcl, Codeine, and Morphine    Review of Systems   Review of Systems  Physical Exam Updated Vital Signs BP 139/82   Pulse 77   Temp 97.7 F (36.5 C)   Resp 18   Ht 5\' 6"  (1.676 m)   Wt 86.7 kg   SpO2 98%   BMI 30.85 kg/m  Physical Exam Vitals and nursing note reviewed.  Constitutional:      General: She is not in acute distress.    Appearance: She is well-developed. She is not ill-appearing.  HENT:     Head: Normocephalic and atraumatic.     Nose: Nose normal.     Mouth/Throat:     Mouth: Mucous membranes are moist.  Eyes:     Extraocular Movements: Extraocular  movements intact.     Conjunctiva/sclera: Conjunctivae normal.     Pupils: Pupils are equal, round, and reactive to light.  Cardiovascular:     Rate and Rhythm: Normal rate and regular rhythm.     Pulses: Normal pulses.     Heart sounds: Normal heart sounds. No murmur heard. Pulmonary:     Effort: Pulmonary effort is normal. No respiratory distress.     Breath sounds: Normal breath sounds.  Abdominal:     General: Abdomen is flat.     Palpations: Abdomen is soft.     Tenderness: There is no abdominal tenderness.  Musculoskeletal:        General: No swelling. Normal range of motion.     Cervical back: Normal range of motion and neck supple.     Comments: No midline spinal tenderness, tenderness to the paraspinal thoracic and lumbar muscles bilaterally  Skin:    General: Skin is warm and dry.     Capillary Refill: Capillary refill takes less than 2 seconds.  Neurological:     General: No focal deficit present.     Mental Status: She is alert.     Cranial Nerves: No cranial nerve deficit.     Sensory: No sensory deficit.     Motor: No weakness.     Coordination: Coordination normal.  Psychiatric:        Mood and Affect: Mood normal.     ED Results / Procedures / Treatments   Labs (all labs ordered are listed, but only abnormal results are displayed) Labs Reviewed  COMPREHENSIVE METABOLIC PANEL - Abnormal; Notable for the following components:      Result Value   Glucose, Bld 115 (*)    All other components within normal limits  CBC WITH DIFFERENTIAL/PLATELET  LIPASE, BLOOD    EKG None  Radiology CT Lumbar Spine Wo Contrast Result Date: 01/03/2024 CLINICAL DATA:  Trauma, lumbar radiculopathy, history of multiple myeloma EXAM: CT LUMBAR SPINE WITHOUT CONTRAST TECHNIQUE: Multidetector CT imaging of  the lumbar spine was performed without intravenous contrast administration. Multiplanar CT image reconstructions were also generated. RADIATION DOSE REDUCTION: This exam was  performed according to the departmental dose-optimization program which includes automated exposure control, adjustment of the mA and/or kV according to patient size and/or use of iterative reconstruction technique. COMPARISON:  CT abdomen/pelvis dated 10/18/2023 FINDINGS: Segmentation: 5 lumbar type vertebral bodies. Alignment: Normal lumbar lordosis. Vertebrae: No acute fracture or focal pathologic process. Specifically, no lytic lesion is seen. Paraspinal and other soft tissues: Status post cholecystectomy. Simple bilateral renal cysts, benign. Vascular calcifications. Disc levels: Mild degenerative changes at L4-5 and L5-S1. Spinal canal is patent. IMPRESSION: No traumatic injury to the lumbar spine. No lytic lesion is seen. Electronically Signed   By: Charline Bills M.D.   On: 01/03/2024 19:19   CT Thoracic Spine Wo Contrast Result Date: 01/03/2024 CLINICAL DATA:  Trauma, history of multiple myeloma EXAM: CT THORACIC SPINE WITHOUT CONTRAST TECHNIQUE: Multidetector CT images of the thoracic were obtained using the standard protocol without intravenous contrast. RADIATION DOSE REDUCTION: This exam was performed according to the departmental dose-optimization program which includes automated exposure control, adjustment of the mA and/or kV according to patient size and/or use of iterative reconstruction technique. COMPARISON:  PET-CT dated 07/19/2023 FINDINGS: Alignment: Normal thoracic kyphosis with mild dextroscoliosis. Vertebrae: No acute fracture or focal pathologic process. Specifically, no lytic lesion is seen. Paraspinal and other soft tissues: Cardiomegaly with leftward cardiomediastinal shift. Left subclavian ICD. Right chest port terminating at the cavoatrial junction. Disc levels: Intervertebral disc spaces are maintained. Spinal canal is patent. IMPRESSION: No traumatic injury to the thoracic spine. No lytic lesion is seen. Electronically Signed   By: Charline Bills M.D.   On: 01/03/2024 19:18     Procedures Procedures    Medications Ordered in ED Medications  lidocaine (LIDODERM) 5 % 1 patch (1 patch Transdermal Patch Applied 01/03/24 1639)  acetaminophen (TYLENOL) tablet 650 mg (650 mg Oral Given 01/03/24 1638)    ED Course/ Medical Decision Making/ A&P                                 Medical Decision Making Amount and/or Complexity of Data Reviewed Labs: ordered. Radiology: ordered.  Risk OTC drugs. Prescription drug management.   Tami [name] is here with back pain.  History of myeloma, hypertension high cholesterol.  She also continues with some chronic diarrhea.  Main concern is her back pain today.  Will get CT scan of her thoracic and lumbar spine to rule out compression fracture.  Tenderness is mostly paraspinal.  She has no abdominal pain.  She is well-appearing.  She does not appear dehydrated.  Does not have any nausea or vomiting.  Overall we will check basic labs get a CTs will give lidocaine patch Tylenol and reevaluate.  She is neurovascular neuromuscular intact.  She does not have any neurologic symptoms.  CT scans are unremarkable per radiology report.  Discharged in good condition.  Overall suspect musculoskeletal process.  Follow-up with primary care doctor.  Flexeril prescribed.  Continue lidocaine patches.  This chart was dictated using voice recognition software.  Despite best efforts to proofread,  errors can occur which can change the documentation meaning.         Final Clinical Impression(s) / ED Diagnoses Final diagnoses:  Acute back pain, unspecified back location, unspecified back pain laterality  Muscle spasm    Rx / DC Orders  ED Discharge Orders          Ordered    cyclobenzaprine (FLEXERIL) 5 MG tablet  2 times daily PRN        01/03/24 1928              Virgina Norfolk, DO 01/03/24 1928

## 2024-01-03 NOTE — ED Notes (Signed)
 Patient given discharge instructions. Questions were answered. Patient verbalized understanding of discharge instructions and care at home.

## 2024-01-03 NOTE — ED Triage Notes (Signed)
 Back pain and diarrhea has returned same as before

## 2024-01-05 ENCOUNTER — Encounter: Payer: Self-pay | Admitting: Medical

## 2024-01-05 ENCOUNTER — Ambulatory Visit (INDEPENDENT_AMBULATORY_CARE_PROVIDER_SITE_OTHER): Payer: 59 | Admitting: Medical

## 2024-01-05 ENCOUNTER — Ambulatory Visit: Payer: Self-pay | Admitting: Family Medicine

## 2024-01-05 VITALS — BP 130/70 | HR 94 | Resp 18 | Ht 66.0 in | Wt 187.0 lb

## 2024-01-05 DIAGNOSIS — R350 Frequency of micturition: Secondary | ICD-10-CM | POA: Diagnosis not present

## 2024-01-05 DIAGNOSIS — H6993 Unspecified Eustachian tube disorder, bilateral: Secondary | ICD-10-CM

## 2024-01-05 DIAGNOSIS — R0981 Nasal congestion: Secondary | ICD-10-CM

## 2024-01-05 DIAGNOSIS — R300 Dysuria: Secondary | ICD-10-CM

## 2024-01-05 LAB — POCT URINALYSIS DIPSTICK
Bilirubin, UA: NEGATIVE
Blood, UA: NEGATIVE
Glucose, UA: NEGATIVE
Nitrite, UA: NEGATIVE
Protein, UA: POSITIVE — AB
Spec Grav, UA: 1.025 (ref 1.010–1.025)
Urobilinogen, UA: 0.2 U/dL
pH, UA: 5 (ref 5.0–8.0)

## 2024-01-05 MED ORDER — FLUCONAZOLE 150 MG PO TABS: 150.0000 mg | ORAL_TABLET | Freq: Every day | ORAL | 0 refills | Status: DC

## 2024-01-05 MED ORDER — CEPHALEXIN 500 MG PO CAPS: 500.0000 mg | ORAL_CAPSULE | Freq: Two times a day (BID) | ORAL | 0 refills | Status: DC

## 2024-01-05 MED ORDER — FLUTICASONE PROPIONATE 50 MCG/ACT NA SUSP: 2.0000 | Freq: Every day | NASAL | 1 refills | Status: DC

## 2024-01-05 MED ORDER — CEFTRIAXONE SODIUM 500 MG IJ SOLR
500.0000 mg | Freq: Once | INTRAMUSCULAR | Status: AC
  Administered 2024-01-05: 1000 mg via INTRAMUSCULAR

## 2024-01-05 MED ORDER — PHENAZOPYRIDINE HCL 100 MG PO TABS: 100.0000 mg | ORAL_TABLET | Freq: Three times a day (TID) | ORAL | 0 refills | Status: DC | PRN

## 2024-01-05 NOTE — Telephone Encounter (Signed)
 Copied from CRM [id number]. Topic: Clinical - Red Word Triage >> Jan 05, 2024 10:54 AM Lennart Pall wrote: Red Word that prompted transfer to Nurse Triage: Patient has possible UTI or yeast infection. No blood in urine, but she has burning and its very painful.  Chief Complaint: UTI Symptoms: Painful urination, urinary frequency, hot flashes Frequency: Ongoing since 12/24/23 Pertinent Negatives: Patient denies fever Disposition: [] ED /[] Urgent Care (no appt availability in office) / [x] Appointment(In office/virtual)/ []  [facility]/ [] Home Care/ [] Refused Recommended Disposition /[] [place] [facility]/ []  Follow-up with PCP Additional Notes: Patient called in to report a variety of urinary symptoms. Patient is experiencing burning while urinating, urinary frequency, slight abdominal pain and hot flashes. Patient stated she also has a yeast infection. Patient denied fever and blood in her urine. Patient was seen in the office on 12/24/23 for a UTI and stated her symptoms never resolved. Patient stated she has finished her antibiotic. This RN advised patient to see a provider within 24 hours. No availability with PCP. This RN scheduled patient with an alternate provider in the office today. This RN advised patient to call back if symptoms worsen. Patient complied.   Reason for Disposition  Urinating more frequently than usual (i.e., frequency)  Answer Assessment - Initial Assessment Questions 1. SYMPTOM: "What's the main symptom you're concerned about?" (e.g., frequency, incontinence)     Burning when urinating 2. ONSET: "When did the urinary symptoms start?"     OV on 12/24/23- states symptoms never resolved 3. PAIN: "Is there any pain?" If Yes, ask: "How bad is it?" (Scale: 1-10; mild, moderate, severe)     States pain is currently at a 10 4. CAUSE: "What do you think is causing the symptoms?"     UTI 5. OTHER SYMPTOMS: "Do you have any other symptoms?" (e.g., blood in urine, fever,  flank pain, pain with urination)     Slight abdominal pain, hot flashes, urinary frequency, states she also has a yeast infection, denies blood in urine, denies fever  Protocols used: Urinary Symptoms-A-AH

## 2024-01-05 NOTE — Progress Notes (Signed)
 Subjective:    [name], female    DOB: May 31, 1956, 68 y.o.   MRN: [id number]  HPI   Discussed the use of AI scribe software for clinical note transcription with the patient, who gave verbal consent to proceed.  History of Present Illness   SELENY [name] is a 68 year old female who presents with recurrent urinary symptoms.  She has been experiencing recurrent urinary symptoms, including burning and frequent urination, which began over the weekend. She has a history of urinary tract infections and was treated with antibiotics recently. Despite completing the prescribed courses, her symptoms have recurred.  Two to three weeks ago, she was diagnosed with a yeast infection and a urinary tract infection (UTI) and was prescribed Cipro 250 mg for three days and Diflucan. After completing the course, she experienced temporary relief but symptoms returned. Subsequently, she was prescribed Cefalexin for seven to eight days, twice daily. Despite completing this course, her symptoms have recurred.  Her urine appeared cloudy, and she has experienced lower back pain, which led to an emergency room visit. A urine culture performed on February 14 showed mixed bacteria but did not grow any specific bacteria.  She also reports ear discomfort, describing her ears as 'stocked up', and has been experiencing nasal congestion. No fever is noted, but she reports lower back pain.        Review of Systems  Constitutional:  Negative for chills, fatigue and fever.  HENT:  Positive for congestion.        Ear presssure  Respiratory:  Negative for cough, chest tightness, shortness of breath and wheezing.   Cardiovascular:  Negative for chest pain and palpitations.  Gastrointestinal:  Negative for abdominal pain and nausea.       Suprapubic  Genitourinary:  Positive for dysuria and frequency. Negative for urgency.  Musculoskeletal:  Positive for back pain.  Skin:  Negative for rash.   Neurological:  Negative for dizziness, light-headedness and numbness.  Hematological:  Negative for adenopathy.  Psychiatric/Behavioral:  Negative for behavioral problems, dysphoric mood and suicidal ideas. The patient is not nervous/anxious.    Past Medical History:  Diagnosis Date   Abnormal SPEP 07/26/2016   Arthritis    knees, hands   Back pain 07/14/2016   Bowel obstruction (HCC) 11/2014   C. 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   Multiple myeloma (HCC) 07/20/2018   Multiple myeloma not having achieved remission (HCC) 07/20/2018   Myalgia 12/31/2016   Obstructive sleep apnea 03/31/2015   Presence of permanent cardiac pacemaker    SOB (shortness of breath) 04/09/2016   Stroke (HCC)    TIAs   UTI (urinary tract infection)      Social History   Socioeconomic History   Marital status: Single    Spouse name: [name]   Number of children: 3   Years of education: [name]   Highest education level: High school graduate  Occupational History   Not on  file  Tobacco Use   Smoking status: Never    Passive exposure: Never   Smokeless tobacco: Never  Vaping Use   Vaping status: Never Used  Substance and Sexual Activity   Alcohol use: No   Drug use: No   Sexual activity: Not Currently  Other Topics Concern   [name]  Social History Narrative   Lives at home alone   Retired   Caffeine: coffee   Social Drivers of Corporate investment banker Strain: Medium Risk (07/08/2023)   Overall Financial  Resource Strain (CARDIA)    Difficulty of Paying Living Expenses: Somewhat hard  Food Insecurity: Low Risk  (12/20/2023)   [facility]   Hunger Vital Sign    Worried About Running Out of Food in the Last Year: Never true    Ran Out of Food in the Last Year: Never true  Transportation Needs: No Transportation Needs (12/20/2023)   [facility]   Transportation    In the past 12 months, has lack of reliable transportation kept you from medical appointments, meetings, work or from getting things needed for daily living? : No  Physical Activity: Inactive (07/08/2023)   Exercise Vital Sign    Days of Exercise per Week: 0 days    Minutes of Exercise per Session: 0 min  Stress: No Stress Concern Present (01/21/2023)   [employer] of Occupational Health - Occupational Stress Questionnaire    Feeling of Stress : Not at all  Social Connections: Moderately Isolated (07/08/2023)   Social Connection and Isolation Panel [NHANES]    Frequency of Communication with Friends and Family: More than three times a week    Frequency of Social Gatherings with Friends and Family: Twice a week    Attends Religious Services: More than 4 times per year    Active Member of [employer] or Organizations: No    Attends Banker Meetings: Never    Marital Status: Divorced  Catering manager Violence: Not At Risk (11/29/2023)   Humiliation, Afraid, Rape, and Kick questionnaire    Fear of Current or Ex-Partner: No    Emotionally Abused: No    Physically Abused: No    Sexually Abused: No    Past Surgical History:  Procedure Laterality Date   ABDOMINAL HYSTERECTOMY     menorraghia, 2006, total   BIOPSY  09/01/2022   Procedure: BIOPSY;  Surgeon: Lynann Bologna, MD;  Location: [facility];  Service: Gastroenterology;;   CHOLECYSTECTOMY     COLONOSCOPY  2018   [facility] per patient   COLONOSCOPY WITH PROPOFOL N/A 09/01/2022    Procedure: COLONOSCOPY WITH PROPOFOL;  Surgeon: Lynann Bologna, MD;  Location: [facility];  Service: Gastroenterology;  Laterality: N/A;   ESOPHAGOGASTRODUODENOSCOPY  2018   [facility]    ESOPHAGOGASTRODUODENOSCOPY (EGD) WITH PROPOFOL N/A 09/01/2022   Procedure: ESOPHAGOGASTRODUODENOSCOPY (EGD) WITH PROPOFOL;  Surgeon: Lynann Bologna, MD;  Location: [facility];  Service: Gastroenterology;  Laterality: N/A;   IR IMAGING GUIDED PORT INSERTION  06/29/2023   KNEE SURGERY     right, repair torn torn cartialage   PACEMAKER INSERTION  08/2014   changed last in 2023 per [facility]   TONSILLECTOMY     TUBAL LIGATION      Family History  Problem Relation Age of Onset   Diabetes Mother    Hypertension Mother    Heart disease Mother        s/p 1 stent  Hyperlipidemia Mother    Arthritis Mother    Cancer Father        COLON   Colon cancer Father 26   Irritable bowel syndrome Sister    Hyperlipidemia Daughter    Hypertension Daughter    Hypertension Maternal Grandmother    Arthritis Maternal Grandmother    Heart disease Maternal Grandfather        MI   Hypertension Maternal Grandfather    Arthritis Maternal Grandfather    Hypertension Son    Esophageal cancer Neg Hx     Allergies  Allergen Reactions   Benazepril Anaphylaxis, Swelling and Hives    angioedema Throat and lip swelling   Ondansetron Hcl Hives    Redness and hives post IV admin on 07/05/17   Codeine Nausea And Vomiting   Morphine Hives    Redness and hives noted post IV admin on 07/05/17    Current Outpatient Medications on File Prior to Visit  Medication Sig Dispense Refill   acetaminophen (TYLENOL) 500 MG tablet Take 500 mg by mouth every 6 (six) hours as needed.     acyclovir (ZOVIRAX) 400 MG tablet Take 1 tablet (400 mg total) by mouth 2 (two) times daily. 60 tablet 3   albuterol (PROVENTIL) (2.5 MG/3ML) 0.083% nebulizer solution Take 3 mLs (2.5 mg total) by nebulization every 6 (six) hours as  needed for wheezing or shortness of breath. 150 mL 2   albuterol (VENTOLIN HFA) 108 (90 Base) MCG/ACT inhaler Inhale 2 puffs into the lungs every 6 (six) hours as needed for wheezing or shortness of breath. 18 g 5   ALPRAZolam (XANAX) 0.25 MG tablet Take 1-2 tablets (0.25-0.5 mg total) by mouth 3 (three) times daily as needed for anxiety. 40 tablet 1   amLODipine (NORVASC) 5 MG tablet Take 5 mg by mouth daily.     aspirin EC 81 MG tablet Take 81 mg by mouth daily. Swallow whole.     budesonide (PULMICORT) 0.5 MG/2ML nebulizer solution Take 2 mLs (0.5 mg total) by nebulization 2 (two) times daily as needed (cough, wheezing or shortness of breath). 120 mL 11   celecoxib (CELEBREX) 100 MG capsule Take 100 mg by mouth 2 (two) times daily.     cetirizine (ZYRTEC) 10 MG tablet Take 10 mg by mouth at bedtime.     Cholecalciferol (VITAMIN D3) 50 MCG (2000 UT) TABS Take 2,000 Units by mouth in the morning.     cholestyramine light (PREVALITE) 4 g packet Take 4 g by mouth 2 (two) times daily.     cyclobenzaprine (FLEXERIL) 5 MG tablet Take 1 tablet (5 mg total) by mouth 2 (two) times daily as needed for up to 10 doses for muscle spasms. 10 tablet 0   dicyclomine (BENTYL) 10 MG capsule Take 1 capsule (10 mg total) by mouth 4 (four) times daily -  before meals and at bedtime. cramping 90 capsule 1   diphenoxylate-atropine (LOMOTIL) 2.5-0.025 MG tablet Take 1 tablet by mouth 2 (two) times daily. 60 tablet 6   famotidine (PEPCID) 20 MG tablet Take 1 tablet (20 mg total) by mouth at bedtime. 30 tablet 2   fidaxomicin (DIFICID) 200 MG TABS tablet Take 1 tablet (200 mg total) by mouth 2 (two) times daily. 20 tablet 0   fluconazole (DIFLUCAN) 150 MG tablet Take 1 tablet (150 mg total) by mouth daily. May repeat in 3 days if needed. 2 tablet 0   fluticasone (FLONASE) 50 MCG/ACT nasal spray Place 2 sprays into both  nostrils daily. 16 g 0   furosemide (LASIX) 20 MG tablet Take 20 mg by mouth daily as needed (fluid  retention (feet swelling)).     HYDROcodone bit-homatropine (HYDROMET) 5-1.5 MG/5ML syrup Take 5 mLs by mouth every 8 (eight) hours as needed for cough. 120 mL 0   hydrOXYzine (ATARAX) 10 MG tablet TAKE ONE (1) TABLET BY MOUTH 3 TIMES DAILY AS NEEDED FOR ITCHING 30 tablet 2   hyoscyamine (LEVSIN SL) 0.125 MG SL tablet Place 1 tablet (0.125 mg total) under the tongue every 4 (four) hours as needed. 30 tablet 1   lidocaine-prilocaine (EMLA) cream Apply 1 Application topically as needed. 30 g 0   loperamide (IMODIUM) 2 MG capsule Take 4 mg by mouth in the morning and at bedtime.     losartan (COZAAR) 50 MG tablet Take 50 mg by mouth in the morning and at bedtime.     Magnesium Glycinate 100 MG CAPS Take 200 mg by mouth at bedtime.     meclizine (ANTIVERT) 12.5 MG tablet Take 12.5 mg by mouth 2 (two) times daily as needed for dizziness or nausea.     metoprolol succinate (TOPROL-XL) 50 MG 24 hr tablet Take 1 tablet (50 mg total) by mouth 2 (two) times daily. (Patient taking differently: Take 50 mg by mouth daily.) 60 tablet 2   montelukast (SINGULAIR) 10 MG tablet Take 1 tablet (10 mg total) by mouth at bedtime. Please start taking this on September 4. 30 tablet 3   Multiple Vitamins-Minerals (CENTRUM SILVER WOMEN 50+ PO) Take 1 tablet by mouth daily at 6 (six) AM.     neomycin-polymyxin-hydrocortisone (CORTISPORIN) OTIC solution Place 4 drops into both ears 4 (four) times daily. 10 mL 4   nortriptyline (PAMELOR) 10 MG capsule Take 1 capsule (10 mg total) by mouth at bedtime. 30 capsule 1   omeprazole (PRILOSEC) 20 MG capsule SMARTSIG:1.0 Capsule(s) By Mouth Daily     ondansetron (ZOFRAN) 8 MG tablet Take 1 tablet (8 mg total) by mouth every 8 (eight) hours as needed for nausea or vomiting. 30 tablet 1   oxyCODONE-acetaminophen (PERCOCET) 5-325 MG tablet Take 2 tablets by mouth every 6 (six) hours as needed. 10 tablet 0   oxyCODONE-acetaminophen (PERCOCET/ROXICET) 5-325 MG tablet Take 1 tablet by mouth  every 6 (six) hours as needed for severe pain.     pantoprazole (PROTONIX) 40 MG tablet Take 1 tablet (40 mg total) by mouth daily. 30 tablet 3   potassium chloride SA (KLOR-CON M) 20 MEQ tablet TAKE 1 TABLET BY MOUTH EVERY MONDAY, WEDNESDAY, AND FRIDAY. 30 tablet 5   prochlorperazine (COMPAZINE) 10 MG tablet Take 0.5 tablets (5 mg total) by mouth 2 (two) times daily as needed for nausea or vomiting. 60 tablet 1   Respiratory Therapy Supplies (PILLOW MASK/ADULT) MISC . Provide PAP supplies (expires 12 months from 09/21/2023) .     Saccharomyces boulardii (FLORASTOR PO) Take 500 mg by mouth daily at 6 (six) AM.     scopolamine (TRANSDERM-SCOP) 1 MG/3DAYS Place 1 patch (1.5 mg total) onto the skin every 3 (three) days. 10 patch 1   sertraline (ZOLOFT) 50 MG tablet Take 1 tablet (50 mg total) by mouth daily. 30 tablet 3   spironolactone (ALDACTONE) 25 MG tablet Take 25 mg by mouth daily.     Current Facility-Administered Medications on File Prior to Visit  Medication Dose Route Frequency Provider Last Rate Last Admin   acetaminophen (TYLENOL) tablet 650 mg  650 mg Oral Once Ennever,  Rose Phi, MD       nitroGLYCERIN (NITROSTAT) SL tablet 0.4 mg  0.4 mg Sublingual Once [name], MD       sodium chloride flush (NS) 0.9 % injection 10 mL  10 mL Intracatheter PRN Josph Macho, MD   10 mL at 09/24/23 1640    BP (!) 148/78   Pulse 94   Resp 18   Ht 5\' 6"  (1.676 m)   Wt 187 lb (84.8 kg)   SpO2 99%   BMI 30.18 kg/m         Objective:   Physical Exam  General Mental Status- Alert. General Appearance- Not in acute distress.   Skin General: Color- Normal Color. Moisture- Normal Moisture.  Neck  No carotid bruits. No JVD.  Chest and Lung Exam Auscultation: Breath Sounds:-Normal.  Cardiovascular Auscultation:Rythm- Regular. Murmurs & Other Heart Sounds:Auscultation of the heart reveals- No Murmurs.  Abdomen Inspection:-Inspeection Normal. Palpation/Percussion:Note:No  mass. Palpation and Percussion of the abdomen reveal- mild suprapubic pressure. Tender, Non Distended + BS, no rebound or guarding.   Neurologic Cranial Nerve exam:- CN III-XII intact(No nystagmus), symmetric smile. Strength:- 5/5 equal and symmetric strength both upper and lower extremities.   Back- mild faint soreness over cva areas.      Assessment & Plan:   Assessment and Plan    Recurrent Urinary Tract Infections Recent history of UTI and yeast infection treated with Cipro 250mg  for 3 days and Diflucan, followed by Cefalexin for 7-8 days. Current symptoms of dysuria and urinary frequency started over the weekend. Urinalysis shows 3+ leukocytes, suggestive of another UTI. -Administer 1g Rocephin today. -Start Cefalexin tomorrow, pending culture results. -Prescribe Pyridium for bladder pain. -Prescribe Diflucan for potential yeast infection secondary to antibiotic use. -Repeat urine culture in 10 days to ensure resolution of infection if culture shows infection -Continue with planned gynecology referral on March 24th.  Ear Discomfort(eustachian tube dysfunction. Possible related to allergic rhinitis. Reports of ear discomfort, possibly due to eustachian tube pressure secondary to nasal congestion/allergies. Wax noted in right ear. -Recommend over-the-counter Debrox for wax softening, with potential for in-office wax removal if symptoms persist. -Flonase nasal spray   Follow up date to be determined after urine culture results and depending on how you do clinically.   Esperanza Richters, PA-C

## 2024-01-05 NOTE — Addendum Note (Signed)
 Addended by: Maximino Sarin on: 01/05/2024 02:23 PM   Modules accepted: Orders

## 2024-01-05 NOTE — Telephone Encounter (Signed)
 Appt today

## 2024-01-05 NOTE — Patient Instructions (Signed)
 Recurrent Urinary Tract Infections Recent history of UTI and yeast infection treated with Cipro 250mg  for 3 days and Diflucan, followed by Cefalexin for 7-8 days. Current symptoms of dysuria and urinary frequency started over the weekend. Urinalysis shows 3+ leukocytes, suggestive of another UTI. -Administer 1g Rocephin today. -Start Cefalexin tomorrow, pending culture results. -Prescribe Pyridium for bladder pain. -Prescribe Diflucan for potential yeast infection secondary to antibiotic use. -Repeat urine culture in 10 days to ensure resolution of infection if culture shows infection -Continue with planned gynecology referral on March 24th.  Ear Discomfort(eustachian tube dysfunction. Possible related to allergic rhinitis. Reports of ear discomfort, possibly due to eustachian tube pressure secondary to nasal congestion/allergies. Wax noted in right ear. -Recommend over-the-counter Debrox for wax softening, with potential for in-office wax removal if symptoms persist. -Flonase nasal spray   Follow up date to be determined after urine culture results and depending on how you do clinically.

## 2024-01-08 DIAGNOSIS — G4733 Obstructive sleep apnea (adult) (pediatric): Secondary | ICD-10-CM | POA: Diagnosis not present

## 2024-01-08 LAB — URINE CULTURE
MICRO NUMBER:: 16132213
SPECIMEN QUALITY:: ADEQUATE

## 2024-01-10 ENCOUNTER — Telehealth: Payer: Self-pay

## 2024-01-10 NOTE — Telephone Encounter (Signed)
 Copied from CRM [id number]. Topic: Clinical - Lab/Test Results >> Jan 10, 2024 10:15 AM Tiffany H wrote: Reason for CRM: Patient called to advise that medicine provided last week seems to be working but patient has enduring back pain. A little diarrhea but she isn't sure that's due to antibiotics. Patient would like some follow up about that. Did not address other anti  Patient called in response to message from Dahlia Client, NP.

## 2024-01-14 NOTE — Telephone Encounter (Signed)
 Can be discussed at follow up 01/24/24

## 2024-01-19 ENCOUNTER — Other Ambulatory Visit: Payer: Self-pay | Admitting: Family Medicine

## 2024-01-19 NOTE — Telephone Encounter (Signed)
 Copied from CRM [id number]. Topic: Clinical - Medication Refill >> Jan 19, 2024  3:32 PM Almira Coaster wrote: Most Recent Primary Care Visit:  Provider: Esperanza Richters  Department: [facility]  Visit Type: ACUTE  Date: 01/05/2024  Medication: ferosul tab 325mg   Has the patient contacted their pharmacy? Yes, asked patient to contact ordering provider (Agent: If no, request that the patient contact the pharmacy for the refill. If patient does not wish to contact the pharmacy document the reason why and proceed with request.) (Agent: If yes, when and what did the pharmacy advise?)  Is this the correct pharmacy for this prescription? Yes If no, delete pharmacy and type the correct one.  This is the patient's preferred pharmacy:  [facility] [place], [place] - [address] [place] [address] Phone: [phone] Fax: [phone]   Has the prescription been filled recently? No  Is the patient out of the medication? Yes  Has the patient been seen for an appointment in the last year OR does the patient have an upcoming appointment? Yes  Can we respond through MyChart? Yes  Agent: Please be advised that Rx refills may take up to 3 business days. We ask that you follow-up with your pharmacy.

## 2024-01-21 ENCOUNTER — Inpatient Hospital Stay: Payer: 59 | Admitting: Hematology & Oncology

## 2024-01-21 ENCOUNTER — Inpatient Hospital Stay: Payer: 59

## 2024-01-21 ENCOUNTER — Inpatient Hospital Stay

## 2024-01-21 ENCOUNTER — Inpatient Hospital Stay: Payer: 59 | Attending: Hematology & Oncology

## 2024-01-21 ENCOUNTER — Encounter: Payer: Self-pay | Admitting: Hematology & Oncology

## 2024-01-21 VITALS — BP 133/76 | HR 98 | Temp 98.0°F | Resp 19 | Ht 66.0 in | Wt 190.1 lb

## 2024-01-21 DIAGNOSIS — R197 Diarrhea, unspecified: Secondary | ICD-10-CM | POA: Diagnosis not present

## 2024-01-21 DIAGNOSIS — R300 Dysuria: Secondary | ICD-10-CM | POA: Insufficient documentation

## 2024-01-21 DIAGNOSIS — C9000 Multiple myeloma not having achieved remission: Secondary | ICD-10-CM

## 2024-01-21 DIAGNOSIS — C9002 Multiple myeloma in relapse: Secondary | ICD-10-CM | POA: Diagnosis not present

## 2024-01-21 DIAGNOSIS — E860 Dehydration: Secondary | ICD-10-CM | POA: Insufficient documentation

## 2024-01-21 LAB — CMP (CANCER CENTER ONLY)
ALT: 12 U/L (ref 0–44)
AST: 17 U/L (ref 15–41)
Albumin: 4 g/dL (ref 3.5–5.0)
Alkaline Phosphatase: 44 U/L (ref 38–126)
Anion gap: 9 (ref 5–15)
BUN: 17 mg/dL (ref 8–23)
CO2: 26 mmol/L (ref 22–32)
Calcium: 9 mg/dL (ref 8.9–10.3)
Chloride: 108 mmol/L (ref 98–111)
Creatinine: 0.68 mg/dL (ref 0.44–1.00)
GFR, Estimated: >60 mL/min (ref >60)
Glucose, Bld: 107 mg/dL — ABNORMAL HIGH (ref 70–99)
Potassium: 3.5 mmol/L (ref 3.5–5.1)
Sodium: 143 mmol/L (ref 135–145)
Total Bilirubin: 0.7 mg/dL (ref 0.0–1.2)
Total Protein: 6.6 g/dL (ref 6.5–8.1)

## 2024-01-21 LAB — URINALYSIS, COMPLETE (UACMP) WITH MICROSCOPIC
Bilirubin Urine: NEGATIVE
Glucose, UA: NEGATIVE mg/dL
Hgb urine dipstick: NEGATIVE
Ketones, ur: NEGATIVE mg/dL
Nitrite: NEGATIVE
Protein, ur: NEGATIVE mg/dL
Specific Gravity, Urine: 1.025 (ref 1.005–1.030)
pH: 6 (ref 5.0–8.0)

## 2024-01-21 LAB — CBC WITH DIFFERENTIAL (CANCER CENTER ONLY)
Abs Immature Granulocytes: 0.04 10*3/uL (ref 0.00–0.07)
Basophils Absolute: 0 10*3/uL (ref 0.0–0.1)
Basophils Relative: 1 %
Eosinophils Absolute: 0.1 10*3/uL (ref 0.0–0.5)
Eosinophils Relative: 2 %
HCT: 35.3 % — ABNORMAL LOW (ref 36.0–46.0)
Hemoglobin: 11.6 g/dL — ABNORMAL LOW (ref 12.0–15.0)
Immature Granulocytes: 1 %
Lymphocytes Relative: 22 %
Lymphs Abs: 0.9 10*3/uL (ref 0.7–4.0)
MCH: 30.1 pg (ref 26.0–34.0)
MCHC: 32.9 g/dL (ref 30.0–36.0)
MCV: 91.7 fL (ref 80.0–100.0)
Monocytes Absolute: 0.4 10*3/uL (ref 0.1–1.0)
Monocytes Relative: 9 %
Neutro Abs: 2.7 10*3/uL (ref 1.7–7.7)
Neutrophils Relative %: 65 %
Platelet Count: 137 10*3/uL — ABNORMAL LOW (ref 150–400)
RBC: 3.85 MIL/uL — ABNORMAL LOW (ref 3.87–5.11)
RDW: 13.3 % (ref 11.5–15.5)
WBC Count: 4.1 10*3/uL (ref 4.0–10.5)
nRBC: 0 % (ref 0.0–0.2)

## 2024-01-21 LAB — LACTATE DEHYDROGENASE: LDH: 188 U/L (ref 98–192)

## 2024-01-21 MED ORDER — SODIUM CHLORIDE 0.9 % IV SOLN
INTRAVENOUS | Status: AC
Start: 2024-01-21 — End: 2024-01-21

## 2024-01-21 MED ORDER — SODIUM CHLORIDE 0.9% FLUSH
10.0000 mL | Freq: Once | INTRAVENOUS | Status: AC
  Administered 2024-01-21: 10 mL via INTRAVENOUS

## 2024-01-21 MED ORDER — HEPARIN SOD (PORK) LOCK FLUSH 100 UNIT/ML IV SOLN
500.0000 [IU] | Freq: Once | INTRAVENOUS | Status: AC
  Administered 2024-01-21: 500 [IU] via INTRAVENOUS

## 2024-01-21 NOTE — Progress Notes (Signed)
 Hematology and Oncology Follow Up Visit  JAELAH HAUTH [id number] [birth year] 68 y.o. 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 Seifried is here today for follow-up and treatment.  She is still having problems with diarrhea.  As such, unguinal hold her chemotherapy for right now.  I think we have the flexibility to hold her chemotherapy.  Her last monoclonal studies continue to show improvement.  Her monoclonal spike was down to 0.4 g/dL.  The IgG level was 970 mg/dL.  The Kappa light chain was 0.6 mg/dL.  She has had no vomiting.  She we will be going to see a neurologist for this.  I know she has seen Gastroenterology..  I just wonder if she would not benefit from a stool transplant or at least consideration of this.  She has had no fever.  There has been no bleeding.  Patient has little bit of back discomfort right now.  Maybe, we need to check a urinalysis on her.  She has had no unfortunately, she is having more diarrhea.  She has been dehydrated.  She has been to the ER.  I just feel bad about this for her.  It certainly seems that she may be a candidate for a stool transplant.  I know this is being done.    I really think that we can hold off on treatment for right now.  She has done incredibly well.  Her monoclonal studies have improved so much.  Her last M spike was down to 0.5 g/dL.  Her IgG level was down to 970 mg/dL.  Kappa light chain was down to 0.6 mg/dL.  I just want her to have better quality of life.  I think by holding treatment  right now, we can certainly give her some quality of life and maybe see if this diarrhea can improve.  I would not think that the chemotherapy is causing her diarrhea.  However, I cannot be totally certain.  By stopping her treatment, we should be able to know.  She has had no fever.  She has had no bleeding.  There has been no cough or shortness of breath.  She has had no problems with her pacemaker.  There has been no leg swelling.  Overall, I would have to say that her performance status is probably ECOG 1-2.     Medications:  Allergies as of 01/21/2024       Reactions   Benazepril Anaphylaxis, Swelling, Hives   angioedema Throat and lip swelling   Ondansetron Hcl Hives   Redness and hives post IV admin on 07/05/17   Codeine Nausea And Vomiting   Morphine Hives   Redness and hives noted post IV admin on 07/05/17        Medication List        Accurate as of January 21, 2024  8:36 AM. If you have any questions, ask your nurse or doctor.          STOP taking these medications  cephALEXin 500 MG capsule Commonly known as: KEFLEX Stopped by: Josph Macho   fidaxomicin 200 MG Tabs tablet Commonly known as: DIFICID Stopped by: Josph Macho   fluconazole 150 MG tablet Commonly known as: Diflucan Stopped by: Josph Macho       TAKE these medications    acetaminophen 500 MG tablet Commonly known as: TYLENOL Take 500 mg by mouth every 6 (six) hours as needed.   acyclovir 400 MG tablet Commonly known as: ZOVIRAX Take 1 tablet (400 mg total) by mouth 2 (two) times daily.   albuterol (2.5 MG/3ML) 0.083% nebulizer solution Commonly known as: PROVENTIL Take 3 mLs (2.5 mg total) by nebulization every 6 (six) hours as needed for wheezing or shortness of breath.   albuterol 108 (90 Base) MCG/ACT inhaler Commonly known as: VENTOLIN HFA Inhale 2 puffs into the lungs every 6 (six) hours as needed for wheezing or shortness of breath.   ALPRAZolam 0.25 MG  tablet Commonly known as: XANAX Take 1-2 tablets (0.25-0.5 mg total) by mouth 3 (three) times daily as needed for anxiety.   amLODipine 5 MG tablet Commonly known as: NORVASC Take 5 mg by mouth daily.   aspirin EC 81 MG tablet Take 81 mg by mouth daily. Swallow whole.   budesonide 0.5 MG/2ML nebulizer solution Commonly known as: Pulmicort Take 2 mLs (0.5 mg total) by nebulization 2 (two) times daily as needed (cough, wheezing or shortness of breath).   celecoxib 100 MG capsule Commonly known as: CELEBREX Take 100 mg by mouth 2 (two) times daily.   CENTRUM SILVER WOMEN 50+ PO Take 1 tablet by mouth daily at 6 (six) AM.   cetirizine 10 MG tablet Commonly known as: ZYRTEC Take 10 mg by mouth at bedtime.   cholestyramine light 4 g packet Commonly known as: PREVALITE Take 4 g by mouth 2 (two) times daily.   cyclobenzaprine 5 MG tablet Commonly known as: FLEXERIL Take 1 tablet (5 mg total) by mouth 2 (two) times daily as needed for up to 10 doses for muscle spasms.   dicyclomine 10 MG capsule Commonly known as: BENTYL Take 1 capsule (10 mg total) by mouth 4 (four) times daily -  before meals and at bedtime. cramping   diphenoxylate-atropine 2.5-0.025 MG tablet Commonly known as: Lomotil Take 1 tablet by mouth 2 (two) times daily.   famotidine 20 MG tablet Commonly known as: PEPCID Take 1 tablet (20 mg total) by mouth at bedtime.   ferrous sulfate 325 (65 FE) MG tablet Commonly known as: FeroSul Take 1 tablet (325 mg total) by mouth daily with breakfast.   FLORASTOR PO Take 500 mg by mouth daily at 6 (six) AM.   fluticasone 50 MCG/ACT nasal spray Commonly known as: FLONASE Place 2 sprays into both nostrils daily.   fluticasone 50 MCG/ACT nasal spray Commonly known as: FLONASE Place 2 sprays into both nostrils daily.   furosemide 20 MG tablet Commonly known as: LASIX Take 20 mg by mouth daily as needed (fluid retention (feet swelling)).   HYDROcodone  bit-homatropine 5-1.5 MG/5ML syrup Commonly known as: Hydromet Take 5 mLs by mouth every 8 (eight) hours as needed for cough.   hydrOXYzine 10 MG tablet Commonly known as: ATARAX TAKE ONE (1) TABLET BY MOUTH 3 TIMES DAILY AS NEEDED FOR ITCHING   hyoscyamine 0.125 MG SL tablet Commonly known as: LEVSIN SL Place 1 tablet (0.125 mg total) under the tongue every 4 (four) hours as needed.   lidocaine-prilocaine cream Commonly known  as: EMLA Apply 1 Application topically as needed.   loperamide 2 MG capsule Commonly known as: IMODIUM Take 4 mg by mouth in the morning and at bedtime.   losartan 50 MG tablet Commonly known as: COZAAR Take 50 mg by mouth in the morning and at bedtime.   Magnesium Glycinate 100 MG Caps Take 200 mg by mouth at bedtime.   meclizine 12.5 MG tablet Commonly known as: ANTIVERT Take 12.5 mg by mouth 2 (two) times daily as needed for dizziness or nausea.   metoprolol succinate 50 MG 24 hr tablet Commonly known as: TOPROL-XL Take 1 tablet (50 mg total) by mouth 2 (two) times daily. What changed: when to take this   montelukast 10 MG tablet Commonly known as: Singulair Take 1 tablet (10 mg total) by mouth at bedtime. Please start taking this on September 4.   neomycin-polymyxin-hydrocortisone OTIC solution Commonly known as: CORTISPORIN Place 4 drops into both ears 4 (four) times daily.   nortriptyline 10 MG capsule Commonly known as: Pamelor Take 1 capsule (10 mg total) by mouth at bedtime.   omeprazole 20 MG capsule Commonly known as: PRILOSEC SMARTSIG:1.0 Capsule(s) By Mouth Daily   ondansetron 8 MG tablet Commonly known as: Zofran Take 1 tablet (8 mg total) by mouth every 8 (eight) hours as needed for nausea or vomiting.   oxyCODONE-acetaminophen 5-325 MG tablet Commonly known as: Percocet Take 2 tablets by mouth every 6 (six) hours as needed.   pantoprazole 40 MG tablet Commonly known as: PROTONIX Take 1 tablet (40 mg total) by mouth  daily.   phenazopyridine 100 MG tablet Commonly known as: Pyridium Take 1 tablet (100 mg total) by mouth 3 (three) times daily as needed for pain.   Pillow Mask/Adult Misc . Provide PAP supplies (expires 12 months from 09/21/2023) .   potassium chloride SA 20 MEQ tablet Commonly known as: KLOR-CON M TAKE 1 TABLET BY MOUTH EVERY MONDAY, WEDNESDAY, AND FRIDAY.   prochlorperazine 10 MG tablet Commonly known as: COMPAZINE Take 0.5 tablets (5 mg total) by mouth 2 (two) times daily as needed for nausea or vomiting.   scopolamine 1 MG/3DAYS Commonly known as: TRANSDERM-SCOP Place 1 patch (1.5 mg total) onto the skin every 3 (three) days.   sertraline 50 MG tablet Commonly known as: ZOLOFT Take 1 tablet (50 mg total) by mouth daily.   spironolactone 25 MG tablet Commonly known as: ALDACTONE Take 25 mg by mouth daily.   Vitamin D3 50 MCG (2000 UT) Tabs Take 2,000 Units by mouth in the morning.        Allergies:  Allergies  Allergen Reactions   Benazepril Anaphylaxis, Swelling and Hives    angioedema Throat and lip swelling   Ondansetron Hcl Hives    Redness and hives post IV admin on 07/05/17   Codeine Nausea And Vomiting   Morphine Hives    Redness and hives noted post IV admin on 07/05/17    Past Medical History, Surgical history, Social history, and Family History were reviewed and updated.  Review of Systems: Review of Systems  Constitutional: Negative.   HENT: Negative.    Eyes: Negative.   Respiratory: Negative.    Cardiovascular:  Positive for palpitations.  Gastrointestinal:  Positive for abdominal pain, diarrhea and vomiting.  Genitourinary: Negative.   Musculoskeletal: Negative.   Skin: Negative.   Neurological: Negative.   Endo/Heme/Allergies: Negative.   Psychiatric/Behavioral: Negative.        Physical Exam:  height is 5\' 6"  (1.676 m) and  weight is 190 lb 1.9 oz (86.2 kg). Her oral temperature is 98 F (36.7 C). Her blood pressure is 133/76  and her pulse is 98. Her respiration is 19 and oxygen saturation is 98%.   Wt Readings from Last 3 Encounters:  01/21/24 190 lb 1.9 oz (86.2 kg)  01/05/24 187 lb (84.8 kg)  01/03/24 191 lb 2.2 oz (86.7 kg)   Physical Exam Vitals reviewed.  HENT:     Head: Normocephalic and atraumatic.  Eyes:     Pupils: Pupils are equal, round, and reactive to light.  Cardiovascular:     Rate and Rhythm: Normal rate and regular rhythm.     Heart sounds: Normal heart sounds.  Pulmonary:     Effort: Pulmonary effort is normal.     Breath sounds: Normal breath sounds.  Abdominal:     General: Bowel sounds are normal.     Palpations: Abdomen is soft.  Musculoskeletal:        General: No tenderness or deformity. Normal range of motion.     Cervical back: Normal range of motion.  Lymphadenopathy:     Cervical: No cervical adenopathy.  Skin:    General: Skin is warm and dry.     Findings: No erythema or rash.  Neurological:     Mental Status: She is alert and oriented to person, place, and time.  Psychiatric:        Behavior: Behavior normal.        Thought Content: Thought content normal.        Judgment: Judgment normal.    Lab Results  Component Value Date   WBC 4.2 01/03/2024   HGB 12.3 01/03/2024   HCT 37.7 01/03/2024   MCV 91.3 01/03/2024   PLT 167 01/03/2024   Lab Results  Component Value Date   FERRITIN 166 11/26/2023   IRON 76 11/26/2023   TIBC 385 11/26/2023   UIBC 309 11/26/2023   IRONPCTSAT 20 11/26/2023   Lab Results  Component Value Date   RETICCTPCT 1.2 11/27/2022   RBC 4.13 01/03/2024   RETICCTABS 32,640 07/16/2016   Lab Results  Component Value Date   KPAFRELGTCHN 5.9 12/24/2023   LAMBDASER 1.8 (L) 12/24/2023   KAPLAMBRATIO 3.28 (H) 12/24/2023   Lab Results  Component Value Date   IGGSERUM 976 12/24/2023   IGA 28 (L) 12/24/2023   IGMSERUM 19 (L) 12/24/2023   Lab Results  Component Value Date   TOTALPROTELP 6.7 12/24/2023   ALBUMINELP 3.7 12/24/2023    A1GS 0.3 12/24/2023   A2GS 0.9 12/24/2023   BETS 1.0 12/24/2023   BETA2SER 0.5 07/16/2016   GAMS 0.8 12/24/2023   MSPIKE 0.4 (H) 12/24/2023   SPEI Comment 12/24/2023     Chemistry      Component Value Date/Time   NA 141 01/03/2024 1440   NA 143 11/08/2017 1127   NA 140 12/31/2016 1000   K 3.9 01/03/2024 1440   K 4.0 11/08/2017 1127   K 3.6 12/31/2016 1000   CL 109 01/03/2024 1440   CL 107 11/08/2017 1127   CO2 22 01/03/2024 1440   CO2 25 11/08/2017 1127   CO2 24 12/31/2016 1000   BUN 20 01/03/2024 1440   BUN 19 11/08/2017 1127   BUN 22.3 12/31/2016 1000   CREATININE 0.70 01/03/2024 1440   CREATININE 0.72 12/24/2023 0910   CREATININE 0.9 11/08/2017 1127   CREATININE 0.8 12/31/2016 1000      Component Value Date/Time   CALCIUM 9.3 01/03/2024 1440  CALCIUM 9.9 11/08/2017 1127   CALCIUM 9.5 12/31/2016 1000   ALKPHOS 50 01/03/2024 1440   ALKPHOS 44 11/08/2017 1127   ALKPHOS 51 12/31/2016 1000   AST 25 01/03/2024 1440   AST 20 12/24/2023 0910   AST 28 12/31/2016 1000   ALT 16 01/03/2024 1440   ALT 13 12/24/2023 0910   ALT 36 11/08/2017 1127   ALT 16 12/31/2016 1000   BILITOT 0.6 01/03/2024 1440   BILITOT 0.5 12/24/2023 0910   BILITOT 0.46 12/31/2016 1000       Impression and Plan: Ms. Trickey is a very pleasant 68 yo African American female with a previous IgG kappa MGUS with progression to myeloma.  Again, she has responded very nicely to treatment.   We are holding treatment for right now.  We will give her IV fluid today.    I will have her come back in 3 weeks.  Hopefully, by then, there may be some improvement in the diarrhea.     Josph Macho, MD 3/14/20258:36 AM

## 2024-01-21 NOTE — Progress Notes (Signed)
 No chemo today, just 1 L of NS over 2 hours per Dr. Myna Hidalgo.

## 2024-01-21 NOTE — Patient Instructions (Signed)
 NO treatment, IVF only Dehydration, Adult Dehydration is a condition in which there is not enough water or other fluids in the body. This happens when a person loses more fluids than they take in. Important organs, such as the kidneys, brain, and heart, cannot function without a proper amount of fluids. Any loss of fluids from the body can lead to dehydration. Dehydration can be mild, moderate, or severe. It should be treated right away to prevent it from becoming severe. What are the causes? Dehydration may be caused by: Health conditions, such as diarrhea, vomiting, fever, infection, or sweating or urinating a lot. Not drinking enough fluids. Certain medicines, such as medicines that remove excess fluid from the body (diuretics). Lack of safe drinking water. Not being able to get enough water and food. What increases the risk? The following factors may make you more likely to develop this condition: Having a long-term (chronic) illness that has not been treated properly, such as diabetes, heart disease, or kidney disease. Being 21 years of age or older. Having a disability. Living in a place that is high in altitude, where thinner, drier air causes more fluid loss. Doing exercises that put stress on your body for a long time (endurance sports). Being active in a hot climate. What are the signs or symptoms? Symptoms of dehydration depend on how severe it is. Mild or moderate dehydration Thirst. Dry lips or dry mouth. Dizziness or light-headedness. Muscle cramps. Dark urine. Urine may be the color of tea. Less urine or tears produced than usual. Headache. Severe dehydration Changes in skin. Your skin may be cold and clammy, blotchy, or pale. Your skin also may not return to normal after being lightly pinched and released. Little or no tears, urine, or sweat. Rapid breathing and low blood pressure. Your pulse may be weak or may be faster than 100 beats per minute when you are sitting  still. Other changes, such as: Feeling very thirsty. Sunken eyes. Cold hands and feet. Confusion. Being very tired (lethargic) or having trouble waking from sleep. Short-term weight loss. Loss of consciousness. How is this diagnosed? This condition is diagnosed based on your symptoms and a physical exam. You may have blood and urine tests to help confirm the diagnosis. How is this treated? Treatment for this condition depends on how severe it is. Treatment should be started right away. Do not wait until dehydration becomes severe. Severe dehydration is an emergency and needs to be treated in a hospital. Mild or moderate dehydration can be treated at home. You may be asked to: Drink more fluids. Drink an oral rehydration solution (ORS). This drink restores fluids, salts, and minerals in the blood (electrolytes). Stop any activities that caused dehydration, such as exercise. Cool off with cool compresses, cool mist, or cool fluids, if heat or too much sweat caused your condition. Take medicine to treat fever, if fever caused your condition. Take medicine to treat nausea and diarrhea, if vomiting or diarrhea caused your condition. Severe dehydration can be treated: With IV fluids. By correcting abnormal levels of electrolytes in your body. By treating the underlying cause of dehydration. Follow these instructions at home: Oral rehydration solution If told by your health care provider, drink an ORS: Make an ORS by following instructions on the package. Start by drinking small amounts, about  cup (120 mL) every 5-10 minutes. Slowly increase how much you drink until you have taken the amount recommended by your health care provider.  Eating and drinking  Drink enough  clear fluid to keep your urine pale yellow. If you were told to drink an ORS, finish the ORS first and then start slowly drinking other clear fluids. Drink fluids such as: Water. Do not drink only water. Doing that can lead  to hyponatremia, which is having too little salt (sodium) in the body. Water from ice chips you suck on. Diluted fruit juice. This is fruit juice that you have added water to. Low-calorie sports drinks. Eat foods that contain a healthy balance of electrolytes, such as bananas, oranges, potatoes, tomatoes, and spinach. Do not drink alcohol. Avoid the following: Drinks that contain a lot of sugar. These include high-calorie sports drinks, fruit juice that is not diluted, and soda. Caffeine. Foods that are greasy or contain a lot of fat or sugar. General instructions Take over-the-counter and prescription medicines only as told by your health care provider. Do not take sodium tablets. Doing that can lead to having too much sodium in the body (hypernatremia). Return to your normal activities as told by your health care provider. Ask your health care provider what activities are safe for you. Keep all follow-up visits. Your health care provider may need to check your progress and suggest new ways to treat your condition. Contact a health care provider if: You have muscle cramps, pain, or discomfort, such as: Pain in your abdomen and the pain gets worse or stays in one area. Stiff neck. You have a rash. You are more irritable than usual. You are sleepier or have a harder time waking. You feel weak or dizzy. You feel very thirsty. Get help right away if: You have symptoms of severe dehydration. You vomit every time you eat or drink. Your vomiting gets worse, does not go away, or includes blood or green matter (bile). You are getting treatment but symptoms are getting worse. You have a fever. You have a severe headache. You have: Diarrhea that gets worse or does not go away. Blood in your stool. This may cause stool to look black and tarry. Not urinating, or urinating only a small amount of very dark urine, within 6-8 hours. You have trouble breathing. These symptoms may be an emergency.  Get help right away. Do not wait to see if the symptoms will go away. Do not drive yourself to the hospital. Call 911. This information is not intended to replace advice given to you by your health care provider. Make sure you discuss any questions you have with your health care provider. Document Revised: 05/25/2022 Document Reviewed: 05/25/2022 Elsevier Patient Education  2024 [employer].

## 2024-01-22 LAB — URINE CULTURE: Culture: <10000 — AB

## 2024-01-22 LAB — IGG, IGA, IGM
IgA: 29 mg/dL — ABNORMAL LOW (ref 87–352)
IgG (Immunoglobin G), Serum: 882 mg/dL (ref 586–1602)
IgM (Immunoglobulin M), Srm: 22 mg/dL — ABNORMAL LOW (ref 26–217)

## 2024-01-23 DIAGNOSIS — R197 Diarrhea, unspecified: Secondary | ICD-10-CM | POA: Diagnosis not present

## 2024-01-23 DIAGNOSIS — M47816 Spondylosis without myelopathy or radiculopathy, lumbar region: Secondary | ICD-10-CM | POA: Diagnosis not present

## 2024-01-23 DIAGNOSIS — R109 Unspecified abdominal pain: Secondary | ICD-10-CM | POA: Diagnosis not present

## 2024-01-23 DIAGNOSIS — M546 Pain in thoracic spine: Secondary | ICD-10-CM | POA: Diagnosis not present

## 2024-01-23 DIAGNOSIS — M545 Low back pain, unspecified: Secondary | ICD-10-CM | POA: Diagnosis not present

## 2024-01-23 DIAGNOSIS — R1084 Generalized abdominal pain: Secondary | ICD-10-CM | POA: Diagnosis not present

## 2024-01-23 DIAGNOSIS — K529 Noninfective gastroenteritis and colitis, unspecified: Secondary | ICD-10-CM | POA: Diagnosis not present

## 2024-01-23 DIAGNOSIS — Z20822 Contact with and (suspected) exposure to covid-19: Secondary | ICD-10-CM | POA: Diagnosis not present

## 2024-01-23 DIAGNOSIS — R739 Hyperglycemia, unspecified: Secondary | ICD-10-CM | POA: Diagnosis not present

## 2024-01-23 NOTE — Assessment & Plan Note (Signed)
 [id number] acceptable, minimize simple carbs. Increase exercise as tolerated.

## 2024-01-23 NOTE — Assessment & Plan Note (Signed)
 Hydrate and monitor

## 2024-01-23 NOTE — Assessment & Plan Note (Signed)
 Well controlled, no changes to meds. Encouraged heart healthy diet such as the DASH diet and exercise as tolerated.

## 2024-01-23 NOTE — Assessment & Plan Note (Signed)
 No recent exacerbation

## 2024-01-24 ENCOUNTER — Encounter: Payer: Self-pay | Admitting: Family Medicine

## 2024-01-24 ENCOUNTER — Ambulatory Visit (INDEPENDENT_AMBULATORY_CARE_PROVIDER_SITE_OTHER): Payer: 59 | Admitting: Family Medicine

## 2024-01-24 VITALS — BP 128/80 | HR 86 | Temp 97.9°F | Resp 18 | Ht 66.0 in | Wt 190.2 lb

## 2024-01-24 DIAGNOSIS — I5042 Chronic combined systolic (congestive) and diastolic (congestive) heart failure: Secondary | ICD-10-CM | POA: Diagnosis not present

## 2024-01-24 DIAGNOSIS — R739 Hyperglycemia, unspecified: Secondary | ICD-10-CM

## 2024-01-24 DIAGNOSIS — I10 Essential (primary) hypertension: Secondary | ICD-10-CM

## 2024-01-24 DIAGNOSIS — R197 Diarrhea, unspecified: Secondary | ICD-10-CM

## 2024-01-24 LAB — COMPREHENSIVE METABOLIC PANEL
ALT: 14 U/L (ref 0–35)
AST: 19 U/L (ref 0–37)
Albumin: 4.2 g/dL (ref 3.5–5.2)
Alkaline Phosphatase: 49 U/L (ref 39–117)
BUN: 15 mg/dL (ref 6–23)
CO2: 29 meq/L (ref 19–32)
Calcium: 9.7 mg/dL (ref 8.4–10.5)
Chloride: 106 meq/L (ref 96–112)
Creatinine, Ser: 0.63 mg/dL (ref 0.40–1.20)
GFR: 90.37 mL/min (ref >60.00)
Glucose, Bld: 74 mg/dL (ref 70–99)
Potassium: 4.1 meq/L (ref 3.5–5.1)
Sodium: 143 meq/L (ref 135–145)
Total Bilirubin: 0.6 mg/dL (ref 0.2–1.2)
Total Protein: 6.6 g/dL (ref 6.0–8.3)

## 2024-01-24 LAB — CBC WITH DIFFERENTIAL/PLATELET
Basophils Absolute: 0 10*3/uL (ref 0.0–0.1)
Basophils Relative: 0.9 % (ref 0.0–3.0)
Eosinophils Absolute: 0.1 10*3/uL (ref 0.0–0.7)
Eosinophils Relative: 2.5 % (ref 0.0–5.0)
HCT: 40.2 % (ref 36.0–46.0)
Hemoglobin: 13.3 g/dL (ref 12.0–15.0)
Lymphocytes Relative: 21.8 % (ref 12.0–46.0)
Lymphs Abs: 0.8 10*3/uL (ref 0.7–4.0)
MCHC: 33.2 g/dL (ref 30.0–36.0)
MCV: 92.3 fl (ref 78.0–100.0)
Monocytes Absolute: 0.3 10*3/uL (ref 0.1–1.0)
Monocytes Relative: 8.5 % (ref 3.0–12.0)
Neutro Abs: 2.4 10*3/uL (ref 1.4–7.7)
Neutrophils Relative %: 66.3 % (ref 43.0–77.0)
Platelets: 166 10*3/uL (ref 150.0–400.0)
RBC: 4.35 Mil/uL (ref 3.87–5.11)
RDW: 14.5 % (ref 11.5–15.5)
WBC: 3.6 10*3/uL — ABNORMAL LOW (ref 4.0–10.5)

## 2024-01-24 LAB — MAGNESIUM: Magnesium: 2 mg/dL (ref 1.5–2.5)

## 2024-01-24 LAB — HEMOGLOBIN A1C: Hgb A1c MFr Bld: 5.4 % (ref 4.6–6.5)

## 2024-01-24 LAB — KAPPA/LAMBDA LIGHT CHAINS
Kappa free light chain: 5.1 mg/L (ref 3.3–19.4)
Kappa, lambda light chain ratio: 3.19 — ABNORMAL HIGH (ref 0.26–1.65)
Lambda free light chains: 1.6 mg/L — ABNORMAL LOW (ref 5.7–26.3)

## 2024-01-24 MED ORDER — DIPHENOXYLATE-ATROPINE 2.5-0.025 MG PO TABS: 1.0000 | ORAL_TABLET | Freq: Two times a day (BID) | ORAL | 6 refills | Status: DC

## 2024-01-24 MED ORDER — HYOSCYAMINE SULFATE 0.125 MG SL SUBL: 0.1250 mg | SUBLINGUAL_TABLET | SUBLINGUAL | 1 refills | Status: DC | PRN

## 2024-01-24 NOTE — Progress Notes (Signed)
 Subjective:    [name], female    DOB: 1956/07/14, 67 y.o.   MRN: [id number]  Chief Complaint  Patient presents with   Follow-up    HPI Discussed the use of AI scribe software for clinical note transcription with the patient, who gave verbal consent to proceed.  History of Present Illness Tami [name] is a 68 year old female with chronic abdominal pain and diarrhea who presents with worsening symptoms and a recent ER visit.  She has chronic abdominal pain and diarrhea, which have worsened recently, leading to a visit to the emergency room. The abdominal pain is severe, radiates to her shoulder blade, and affects her ability to rest at night. A CT scan at the ER showed no significant findings, but her magnesium levels were low, and she received magnesium supplementation. She continues to take magnesium glycinate 400 mg daily, split into two doses, but her levels remain low. She experiences diarrhea three times in the morning and three times at night, described as sticky but not bloody, and persists despite taking Kaopectate. She received fluids at the ER, which provided some relief. She also reports feverish sensations, feeling hot and uncomfortable, and sour regurgitation of food. She has a history of bowel obstruction, treated with nasogastric tube decompression, which she believes contributes to her ongoing gastrointestinal issues.  She experiences numbness in her right foot when lying down, which resolves upon standing and shaking the foot. This numbness occurs intermittently and is accompanied by a sensation of coldness in her feet. She also reports muscle cramps and palpitations, which may be related to her low magnesium levels.  She reports urinary incontinence, with leakage occurring at least three times a day, and uses pads for management. She experiences difficulty fully emptying her bladder and sometimes needs to push on her abdomen to urinate. She has had four  to five urinary tract infections in the past year.  She has a history of anemia, which was noted again in recent blood work. She mentions borderline blood sugar levels and a family history of diabetes, prompting a check of her hemoglobin A1c. No recent antibiotic use except for a course given for a urinary tract infection two and a half weeks ago.  She fell in the bathtub recently, feeling weak and almost passing out, which she attributes to her ongoing symptoms and dehydration. No head injury or significant injuries from the fall.    Past Medical History:  Diagnosis Date   Abnormal SPEP 07/26/2016   Arthritis    knees, hands   Back pain 07/14/2016   Bowel obstruction (HCC) 11/2014   C. 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   Multiple myeloma (HCC) 07/20/2018   Multiple myeloma not having achieved remission (HCC) 07/20/2018   Myalgia 12/31/2016   Obstructive sleep apnea 03/31/2015   Presence of permanent cardiac pacemaker    SOB (shortness of breath) 04/09/2016   Stroke (HCC)    TIAs   UTI (urinary tract infection)  Past Surgical History:  Procedure Laterality Date   ABDOMINAL HYSTERECTOMY     menorraghia, 2006, total   BIOPSY  09/01/2022   Procedure: BIOPSY;  Surgeon: Lynann Bologna, MD;  Location: [facility];  Service: Gastroenterology;;   CHOLECYSTECTOMY     COLONOSCOPY  2018   [facility] per patient   COLONOSCOPY WITH PROPOFOL N/A 09/01/2022   Procedure: COLONOSCOPY WITH PROPOFOL;  Surgeon: Lynann Bologna, MD;  Location: [facility];  Service: Gastroenterology;  Laterality: N/A;   ESOPHAGOGASTRODUODENOSCOPY  2018   [facility]    ESOPHAGOGASTRODUODENOSCOPY (EGD) WITH PROPOFOL N/A 09/01/2022   Procedure: ESOPHAGOGASTRODUODENOSCOPY (EGD) WITH PROPOFOL;  Surgeon: Lynann Bologna, MD;  Location: [facility];  Service: Gastroenterology;  Laterality: N/A;   IR IMAGING GUIDED PORT INSERTION  06/29/2023   KNEE SURGERY     right, repair torn torn cartialage   PACEMAKER INSERTION  08/2014   changed last in 2023 per pt at [facility]   TONSILLECTOMY     TUBAL LIGATION      Family History  Problem Relation Age of Onset   Diabetes Mother    Hypertension Mother    Heart disease Mother        s/p 1 stent   Hyperlipidemia Mother    Arthritis Mother    Cancer Father        COLON   Colon cancer Father 71   Irritable bowel syndrome Sister    Hyperlipidemia Daughter    Hypertension Daughter    Hypertension Maternal Grandmother    Arthritis Maternal Grandmother    Heart disease Maternal Grandfather        MI   Hypertension Maternal Grandfather    Arthritis Maternal Grandfather    Hypertension Son    Esophageal cancer Neg Hx     Social History   Socioeconomic History   Marital status: Single    Spouse name: [name]   Number of children: 3   Years of education: [name]   Highest education level: High school graduate  Occupational History   [name]  Tobacco Use   Smoking status: Never    Passive exposure: Never   Smokeless tobacco: Never  Vaping Use   Vaping status: Never Used  Substance and Sexual Activity   Alcohol use: No   Drug use: No   Sexual activity: Not Currently  Other Topics Concern   [name]  Social History Narrative   Lives at home alone   Retired   Caffeine: coffee   Social Drivers of Health   Financial Resource Strain: Medium Risk (07/08/2023)   Overall Financial Resource Strain (CARDIA)    Difficulty of Paying Living Expenses:  Somewhat hard  Food Insecurity: Low Risk  (12/20/2023)   [facility]   Hunger Vital Sign    Worried About Running Out of Food in the Last Year: Never true    Ran Out of Food in the Last Year: Never true  Transportation Needs: No Transportation Needs (12/20/2023)   [facility]   Transportation    In the past 12 months, has lack of reliable transportation kept you from medical appointments, meetings, work or from getting things needed for daily living? : No  Physical Activity: Inactive (07/08/2023)   Exercise Vital Sign    Days of Exercise per Week: 0 days    Minutes of Exercise per Session: 0 min  Stress: No Stress Concern Present (01/21/2023)   [employer]  of Occupational Health - Occupational Stress Questionnaire    Feeling of Stress : Not at all  Social Connections: Moderately Isolated (07/08/2023)   Social Connection and Isolation Panel [NHANES]    Frequency of Communication with Friends and Family: More than three times a week    Frequency of Social Gatherings with Friends and Family: Twice a week    Attends Religious Services: More than 4 times per year    Active Member of [employer] or Organizations: No    Attends Banker Meetings: Never    Marital Status: Divorced  Catering manager Violence: Not At Risk (11/29/2023)   Humiliation, Afraid, Rape, and Kick questionnaire    Fear of Current or Ex-Partner: No    Emotionally Abused: No    Physically Abused: No    Sexually Abused: No    Outpatient Medications Prior to Visit  Medication Sig Dispense Refill   acetaminophen (TYLENOL) 500 MG tablet Take 500 mg by mouth every 6 (six) hours as needed.     acyclovir (ZOVIRAX) 400 MG tablet Take 1 tablet (400 mg total) by mouth 2 (two) times daily. 60 tablet 3   albuterol (PROVENTIL) (2.5 MG/3ML) 0.083% nebulizer solution Take 3 mLs (2.5 mg total) by nebulization every 6 (six) hours as needed for wheezing or shortness  of breath. 150 mL 2   albuterol (VENTOLIN HFA) 108 (90 Base) MCG/ACT inhaler Inhale 2 puffs into the lungs every 6 (six) hours as needed for wheezing or shortness of breath. 18 g 5   ALPRAZolam (XANAX) 0.25 MG tablet Take 1-2 tablets (0.25-0.5 mg total) by mouth 3 (three) times daily as needed for anxiety. 40 tablet 1   amLODipine (NORVASC) 5 MG tablet Take 5 mg by mouth daily.     aspirin EC 81 MG tablet Take 81 mg by mouth daily. Swallow whole.     budesonide (PULMICORT) 0.5 MG/2ML nebulizer solution Take 2 mLs (0.5 mg total) by nebulization 2 (two) times daily as needed (cough, wheezing or shortness of breath). 120 mL 11   celecoxib (CELEBREX) 100 MG capsule Take 100 mg by mouth 2 (two) times daily.     cetirizine (ZYRTEC) 10 MG tablet Take 10 mg by mouth at bedtime.     Cholecalciferol (VITAMIN D3) 50 MCG (2000 UT) TABS Take 2,000 Units by mouth in the morning.     cholestyramine light (PREVALITE) 4 g packet Take 4 g by mouth 2 (two) times daily.     cyclobenzaprine (FLEXERIL) 5 MG tablet Take 1 tablet (5 mg total) by mouth 2 (two) times daily as needed for up to 10 doses for muscle spasms. 10 tablet 0   dicyclomine (BENTYL) 10 MG capsule Take 1 capsule (10 mg total) by mouth 4 (four) times daily -  before meals and at bedtime. cramping 90 capsule 1   famotidine (PEPCID) 20 MG tablet Take 1 tablet (20 mg total) by mouth at bedtime. 30 tablet 2   ferrous sulfate (FEROSUL) 325 (65 FE) MG tablet Take 1 tablet (325 mg total) by mouth daily with breakfast. 90 tablet 0   fluticasone (FLONASE) 50 MCG/ACT nasal spray Place 2 sprays into both nostrils daily. 16 g 0   furosemide (LASIX) 20 MG tablet Take 20 mg by mouth daily as needed (fluid retention (feet swelling)).     HYDROcodone bit-homatropine (HYDROMET) 5-1.5 MG/5ML syrup Take 5 mLs by mouth every 8 (eight) hours as needed for cough. 120 mL 0   hydrOXYzine (ATARAX) 10 MG tablet TAKE ONE (  1) TABLET BY MOUTH 3 TIMES DAILY AS NEEDED FOR ITCHING 30  tablet 2   lidocaine-prilocaine (EMLA) cream Apply 1 Application topically as needed. 30 g 0   loperamide (IMODIUM) 2 MG capsule Take 4 mg by mouth in the morning and at bedtime.     losartan (COZAAR) 50 MG tablet Take 50 mg by mouth in the morning and at bedtime.     Magnesium Glycinate 100 MG CAPS Take 200 mg by mouth at bedtime.     meclizine (ANTIVERT) 12.5 MG tablet Take 12.5 mg by mouth 2 (two) times daily as needed for dizziness or nausea.     metoprolol succinate (TOPROL-XL) 50 MG 24 hr tablet Take 1 tablet (50 mg total) by mouth 2 (two) times daily. (Patient taking differently: Take 50 mg by mouth daily.) 60 tablet 2   montelukast (SINGULAIR) 10 MG tablet Take 1 tablet (10 mg total) by mouth at bedtime. Please start taking this on September 4. 30 tablet 3   Multiple Vitamins-Minerals (CENTRUM SILVER WOMEN 50+ PO) Take 1 tablet by mouth daily at 6 (six) AM.     neomycin-polymyxin-hydrocortisone (CORTISPORIN) OTIC solution Place 4 drops into both ears 4 (four) times daily. 10 mL 4   nortriptyline (PAMELOR) 10 MG capsule Take 1 capsule (10 mg total) by mouth at bedtime. 30 capsule 1   omeprazole (PRILOSEC) 20 MG capsule SMARTSIG:1.0 Capsule(s) By Mouth Daily     ondansetron (ZOFRAN) 8 MG tablet Take 1 tablet (8 mg total) by mouth every 8 (eight) hours as needed for nausea or vomiting. 30 tablet 1   oxyCODONE-acetaminophen (PERCOCET) 5-325 MG tablet Take 2 tablets by mouth every 6 (six) hours as needed. 10 tablet 0   pantoprazole (PROTONIX) 40 MG tablet Take 1 tablet (40 mg total) by mouth daily. 30 tablet 3   phenazopyridine (PYRIDIUM) 100 MG tablet Take 1 tablet (100 mg total) by mouth 3 (three) times daily as needed for pain. 6 tablet 0   potassium chloride SA (KLOR-CON M) 20 MEQ tablet TAKE 1 TABLET BY MOUTH EVERY MONDAY, WEDNESDAY, AND FRIDAY. 30 tablet 5   prochlorperazine (COMPAZINE) 10 MG tablet Take 0.5 tablets (5 mg total) by mouth 2 (two) times daily as needed for nausea or vomiting.  60 tablet 1   Respiratory Therapy Supplies (PILLOW MASK/ADULT) MISC      Saccharomyces boulardii (FLORASTOR PO) Take 500 mg by mouth daily at 6 (six) AM.     scopolamine (TRANSDERM-SCOP) 1 MG/3DAYS Place 1 patch (1.5 mg total) onto the skin every 3 (three) days. 10 patch 1   sertraline (ZOLOFT) 50 MG tablet Take 1 tablet (50 mg total) by mouth daily. 30 tablet 3   spironolactone (ALDACTONE) 25 MG tablet Take 25 mg by mouth daily.     diphenoxylate-atropine (LOMOTIL) 2.5-0.025 MG tablet Take 1 tablet by mouth 2 (two) times daily. 60 tablet 6   hyoscyamine (LEVSIN SL) 0.125 MG SL tablet Place 1 tablet (0.125 mg total) under the tongue every 4 (four) hours as needed. 30 tablet 1   fluticasone (FLONASE) 50 MCG/ACT nasal spray Place 2 sprays into both nostrils daily. 16 g 1   Facility-Administered Medications Prior to Visit  Medication Dose Route Frequency Provider Last Rate Last Admin   acetaminophen (TYLENOL) tablet 650 mg  650 mg Oral Once Josph Macho, MD       nitroGLYCERIN (NITROSTAT) SL tablet 0.4 mg  0.4 mg Sublingual Once [name], MD       sodium  chloride flush (NS) 0.9 % injection 10 mL  10 mL Intracatheter PRN Josph Macho, MD   10 mL at 09/24/23 1640    Allergies  Allergen Reactions   Benazepril Anaphylaxis, Swelling and Hives    angioedema Throat and lip swelling   Ondansetron Hcl Hives    Redness and hives post IV admin on 07/05/17   Codeine Nausea And Vomiting   Morphine Hives    Redness and hives noted post IV admin on 07/05/17    Review of Systems  Constitutional:  Positive for chills, fever and malaise/fatigue.  HENT:  Negative for congestion.   Eyes:  Negative for blurred vision.  Respiratory:  Negative for shortness of breath.   Cardiovascular:  Negative for chest pain, palpitations and leg swelling.  Gastrointestinal:  Positive for abdominal pain, diarrhea and nausea. Negative for blood in stool.  Genitourinary:  Negative for dysuria and frequency.   Musculoskeletal:  Positive for falls.  Skin:  Negative for rash.  Neurological:  Positive for weakness. Negative for dizziness, loss of consciousness and headaches.  Endo/Heme/Allergies:  Negative for environmental allergies.  Psychiatric/Behavioral:  Negative for depression. The patient is not nervous/anxious.        Objective:    Physical Exam Constitutional:      General: She is not in acute distress.    Appearance: Normal appearance. She is well-developed. She is not toxic-appearing.  HENT:     Head: Normocephalic and atraumatic.     Right Ear: External ear normal.     Left Ear: External ear normal.     Nose: Nose normal.  Eyes:     General:        Right eye: No discharge.        Left eye: No discharge.     Conjunctiva/sclera: Conjunctivae normal.  Neck:     Thyroid: No thyromegaly.  Cardiovascular:     Rate and Rhythm: Normal rate and regular rhythm.     Heart sounds: Normal heart sounds. No murmur heard. Pulmonary:     Effort: Pulmonary effort is normal. No respiratory distress.     Breath sounds: Normal breath sounds.  Abdominal:     General: Bowel sounds are normal.     Palpations: Abdomen is soft.     Tenderness: There is no abdominal tenderness. There is no guarding.  Musculoskeletal:        General: Normal range of motion.     Cervical back: Neck supple.  Lymphadenopathy:     Cervical: No cervical adenopathy.  Skin:    General: Skin is warm and dry.  Neurological:     Mental Status: She is alert and oriented to person, place, and time.  Psychiatric:        Mood and Affect: Mood normal.        Behavior: Behavior normal.        Thought Content: Thought content normal.        Judgment: Judgment normal.     BP 128/80 (BP Location: Right Arm, Patient Position: Sitting, Cuff Size: Normal)   Pulse 86   Temp 97.9 F (36.6 C) (Oral)   Resp 18   Ht 5\' 6"  (1.676 m)   Wt 190 lb 3.2 oz (86.3 kg)   SpO2 100%   BMI 30.70 kg/m  Wt Readings from Last 3  Encounters:  01/24/24 190 lb 3.2 oz (86.3 kg)  01/21/24 190 lb 1.9 oz (86.2 kg)  01/05/24 187 lb (84.8 kg)    Diabetic Foot  Exam - Simple   No data filed    Lab Results  Component Value Date   WBC 3.6 (L) 01/24/2024   HGB 13.3 01/24/2024   HCT 40.2 01/24/2024   PLT 166.0 01/24/2024   GLUCOSE 74 01/24/2024   CHOL 179 06/14/2023   TRIG 94.0 06/14/2023   HDL 46.90 06/14/2023   LDLCALC 113 (H) 06/14/2023   ALT 14 01/24/2024   AST 19 01/24/2024   NA 143 01/24/2024   K 4.1 01/24/2024   CL 106 01/24/2024   CREATININE 0.63 01/24/2024   BUN 15 01/24/2024   CO2 29 01/24/2024   TSH 1.40 09/16/2023   INR 0.90 07/28/2018   HGBA1C 5.4 01/24/2024    Lab Results  Component Value Date   TSH 1.40 09/16/2023   Lab Results  Component Value Date   WBC 3.6 (L) 01/24/2024   HGB 13.3 01/24/2024   HCT 40.2 01/24/2024   MCV 92.3 01/24/2024   PLT 166.0 01/24/2024   Lab Results  Component Value Date   NA 143 01/24/2024   K 4.1 01/24/2024   CHLORIDE 108 12/31/2016   CO2 29 01/24/2024   GLUCOSE 74 01/24/2024   BUN 15 01/24/2024   CREATININE 0.63 01/24/2024   BILITOT 0.6 01/24/2024   ALKPHOS 49 01/24/2024   AST 19 01/24/2024   ALT 14 01/24/2024   PROT 6.6 01/24/2024   ALBUMIN 4.2 01/24/2024   CALCIUM 9.7 01/24/2024   ANIONGAP 9 01/21/2024   EGFR 90.0 09/03/2023   GFR 90.37 01/24/2024   Lab Results  Component Value Date   CHOL 179 06/14/2023   Lab Results  Component Value Date   HDL 46.90 06/14/2023   Lab Results  Component Value Date   LDLCALC 113 (H) 06/14/2023   Lab Results  Component Value Date   TRIG 94.0 06/14/2023   Lab Results  Component Value Date   CHOLHDL 4 06/14/2023   Lab Results  Component Value Date   HGBA1C 5.4 01/24/2024       Assessment & Plan:  Hyperglycemia Assessment & Plan: hgba1c acceptable, minimize simple carbs. Increase exercise as tolerated.   Orders: -     Hemoglobin A1c  Primary hypertension Assessment & Plan: Well  controlled, no changes to meds. Encouraged heart healthy diet such as the DASH diet and exercise as tolerated.    Orders: -     Comprehensive metabolic panel -     CBC with Differential/Platelet  Diarrhea, unspecified type Assessment & Plan: Hydrate and monitor   Orders: -     Magnesium  Chronic combined systolic and diastolic congestive heart failure (HCC) Assessment & Plan: No recent exacerbation   Other orders -     Diphenoxylate-Atropine; Take 1 tablet by mouth 2 (two) times daily.  Dispense: 60 tablet; Refill: 6 -     Hyoscyamine Sulfate; Place 1 tablet (0.125 mg total) under the tongue every 4 (four) hours as needed.  Dispense: 30 tablet; Refill: 1    Assessment and Plan Assessment & Plan Abdominal Pain Chronic abdominal pain with recurrent diarrhea, nausea, and vomiting. CT scan unremarkable. Low magnesium levels noted. Diarrhea persists despite treatment. Differential includes gastrointestinal issues possibly related to previous bowel obstructions and scar tissue. Discussed fecal transplant as potential treatment. - Increase magnesium glycinate to 600 mg daily, 400 mg in the morning and 200 mg in the afternoon. - Check magnesium levels in one week. - Discuss fecal transplant with Dr. Ilsa Iha. - Refill Lomotil for diarrhea management. - Order blood work: magnesium,  potassium, CBC, hemoglobin A1c.  Recurrent Clostridium difficile Infection Recurrent C. difficile infection. Current symptoms do not suggest active infection. - Monitor symptoms and consider stool testing if symptoms suggest recurrence.  Anemia Recent blood work indicated mild anemia. No specific cause identified. - Monitor hemoglobin levels and symptoms.  Peripheral Neuropathy Intermittent numbness in right foot, possibly due to nerve compression or circulatory issues. Cold sensation possibly related to anemia or magnesium deficiency. - Monitor symptoms and consider further evaluation if symptoms persist  or worsen.  Urinary Incontinence Urinary incontinence with stress and urge components. History of multiple UTIs. - Consider referral to urogynecologist for evaluation and management.  Hypertension Blood pressure was borderline. - Recheck blood pressure during follow-up visits.  General Health Maintenance Discussed importance of monitoring blood sugar levels due to borderline high readings. Family history of diabetes noted. - Order hemoglobin A1c to assess long-term blood sugar control.     Danise Edge, MD

## 2024-01-24 NOTE — Patient Instructions (Signed)
 Discuss with Dr Drue Second  Fecal Transplant Fecal transplant, or fecal microbiota transplantation (FMT), is a procedure to put good bacteria back into your body. You may need this if the bacteria in your body have gotten out of balance. This can happen: If you get an infection caused by a type of bacteria called Clostridioides difficile (C. difficile or C. diff). After you take antibiotics. During a fecal transplant, you will get a solution made from the poop (stool) sample of a healthy person mixed with salt and water (saline). The solution may be put in through: The opening of your butt (anus). It may be put through a syringe or through a tube that will be moved to the last part of your lower intestine (colon). Your nose or mouth. The solution may be put through a tube, or you may be given a capsule to swallow. Tell a health care provider about: Any bowel problems you have had. Any allergies you have. All medicines you are taking, including vitamins, herbs, eye drops, creams, and over-the-counter medicines. Any problems you or family members have had with anesthesia. Any bleeding problems you have. Any surgeries you have had. Any medical conditions you have. Whether you are pregnant or may be pregnant. What are the risks? Your health care provider will talk with you about risks. These may include: Infection. Bleeding. Allergic reactions to medicines. Nausea or vomiting. Damage to nearby structures or organs. What happens before the procedure? Medicines Ask your provider about: Changing or stopping your regular medicines. These include any diabetes medicines or blood thinners you take. Taking medicines such as aspirin and ibuprofen. These medicines can thin your blood. Do not take them unless your provider tells you to. Taking over-the-counter medicines, vitamins, herbs, and supplements. Taking antibiotics. If you were prescribed antibiotics, take them as told by your provider. Do not  stop using the antibiotic even if you start to feel better. Bowel prep You may be asked to do bowel prep. You may need to: Take a medicine that helps you poop (laxative). Use a bulb syringe (enema) to clear your bowels. General instructions Follow instructions from your provider about what you may eat and drink. Ask your provider what steps will be taken to prevent infection. If you will be going home right after the procedure, plan to have a responsible adult: Take you home from the hospital or clinic. You will not be allowed to drive. Care for you for the time you are told. What happens during the procedure? The fecal transplant solution will be put into your body using one of four methods. The exact procedure may vary among providers and hospitals. Colonoscopy or sigmoidoscopy  An IV will be inserted into one of your veins. You may be given: A sedative. This helps you relax. Anesthesia. This keeps you from feeling pain. It will numb certain areas of your body. A thin, soft tube called a scope will be put into your anus. The scope will be moved into your colon. The solution will be put in your colon. The scope may be flushed with saline. The procedure may be done more than once. It will be done until enough of the solution is in your colon. Nasogastric tube insertion or endoscopy  An IV will be inserted into one of your veins. You may be given: A sedative. This helps you relax. Anesthesia. This keeps you from feeling pain. It will numb certain areas of your body. A thin, soft tube called a nasogastric (NG) tube or  endoscope will be put through your nose or mouth. The tube will be moved down through your stomach to your small intestine. If an NG tube is being used, an X-ray may be done to check that it is in the right place. The solution will be given through the tube. The tube may be flushed with saline. Enema A syringe will be put into your rectum. The solution will be put  through the syringe. Medicines by mouth You may be asked to swallow a capsule that has the fecal bacteria in it. What happens after the procedure? Your blood pressure, heart rate, breathing rate, and blood oxygen level may be monitored until you leave the hospital or clinic. This information is not intended to replace advice given to you by your health care provider. Make sure you discuss any questions you have with your health care provider. Document Revised: 11/10/2022 Document Reviewed: 11/10/2022 Elsevier Patient Education  2024 [employer].

## 2024-01-27 LAB — PROTEIN ELECTROPHORESIS, SERUM, WITH REFLEX
A/G Ratio: 1.1 (ref 0.7–1.7)
Albumin ELP: 3.3 g/dL (ref 2.9–4.4)
Alpha-1-Globulin: 0.2 g/dL (ref 0.0–0.4)
Alpha-2-Globulin: 0.9 g/dL (ref 0.4–1.0)
Beta Globulin: 1 g/dL (ref 0.7–1.3)
Gamma Globulin: 0.8 g/dL (ref 0.4–1.8)
Globulin, Total: 2.9 g/dL (ref 2.2–3.9)
M-Spike, %: 0.4 g/dL — ABNORMAL HIGH
SPEP Interpretation: 0
Total Protein ELP: 6.2 g/dL (ref 6.0–8.5)

## 2024-01-27 LAB — IMMUNOFIXATION REFLEX, SERUM
IgA: 31 mg/dL — ABNORMAL LOW (ref 87–352)
IgG (Immunoglobin G), Serum: 920 mg/dL (ref 586–1602)
IgM (Immunoglobulin M), Srm: 19 mg/dL — ABNORMAL LOW (ref 26–217)

## 2024-01-31 ENCOUNTER — Encounter: Payer: Self-pay | Admitting: Obstetrics

## 2024-01-31 ENCOUNTER — Other Ambulatory Visit (HOSPITAL_COMMUNITY)
Admission: RE | Admit: 2024-01-31 | Discharge: 2024-01-31 | Disposition: A | Discharge status: Home / Self Care | Source: Other Acute Inpatient Hospital | Attending: Obstetrics | Admitting: Obstetrics

## 2024-01-31 ENCOUNTER — Ambulatory Visit: Payer: 59 | Admitting: Obstetrics

## 2024-01-31 ENCOUNTER — Other Ambulatory Visit (HOSPITAL_COMMUNITY)
Admission: RE | Admit: 2024-01-31 | Discharge: 2024-01-31 | Disposition: A | Discharge status: Home / Self Care | Source: Ambulatory Visit | Attending: Obstetrics | Admitting: Obstetrics

## 2024-01-31 VITALS — BP 155/97 | HR 106 | Ht 65.0 in | Wt 189.4 lb

## 2024-01-31 DIAGNOSIS — N3946 Mixed incontinence: Secondary | ICD-10-CM | POA: Diagnosis not present

## 2024-01-31 DIAGNOSIS — R300 Dysuria: Secondary | ICD-10-CM | POA: Insufficient documentation

## 2024-01-31 DIAGNOSIS — N898 Other specified noninflammatory disorders of vagina: Secondary | ICD-10-CM | POA: Diagnosis present

## 2024-01-31 DIAGNOSIS — R351 Nocturia: Secondary | ICD-10-CM | POA: Diagnosis not present

## 2024-01-31 DIAGNOSIS — K5900 Constipation, unspecified: Secondary | ICD-10-CM

## 2024-01-31 DIAGNOSIS — I639 Cerebral infarction, unspecified: Secondary | ICD-10-CM | POA: Diagnosis not present

## 2024-01-31 DIAGNOSIS — R3989 Other symptoms and signs involving the genitourinary system: Secondary | ICD-10-CM

## 2024-01-31 DIAGNOSIS — N952 Postmenopausal atrophic vaginitis: Secondary | ICD-10-CM | POA: Insufficient documentation

## 2024-01-31 DIAGNOSIS — R102 Pelvic and perineal pain: Secondary | ICD-10-CM | POA: Diagnosis not present

## 2024-01-31 DIAGNOSIS — R159 Full incontinence of feces: Secondary | ICD-10-CM

## 2024-01-31 DIAGNOSIS — N393 Stress incontinence (female) (male): Secondary | ICD-10-CM | POA: Insufficient documentation

## 2024-01-31 LAB — POCT URINALYSIS DIPSTICK
Bilirubin, UA: NEGATIVE
Blood, UA: NEGATIVE
Glucose, UA: NEGATIVE
Ketones, UA: NEGATIVE
Leukocytes, UA: NEGATIVE
Nitrite, UA: NEGATIVE
Protein, UA: POSITIVE — AB
Spec Grav, UA: 1.025 (ref 1.010–1.025)
Urobilinogen, UA: 0.2 U/dL
pH, UA: 5 (ref 5.0–8.0)

## 2024-01-31 MED ORDER — ESTRADIOL 0.1 MG/GM VA CREA: 0.5000 g | TOPICAL_CREAM | VAGINAL | 3 refills | Status: AC

## 2024-01-31 MED ORDER — GEMTESA 75 MG PO TABS: 75.0000 mg | ORAL_TABLET | Freq: Every day | ORAL | 2 refills | Status: DC

## 2024-01-31 MED ORDER — PHENAZOPYRIDINE HCL 200 MG PO TABS: 200.0000 mg | ORAL_TABLET | Freq: Three times a day (TID) | ORAL | 0 refills | Status: DC | PRN

## 2024-01-31 MED ORDER — LIDOCAINE 5 % EX OINT: 1.0000 | TOPICAL_OINTMENT | CUTANEOUS | 0 refills | Status: AC | PRN

## 2024-01-31 MED ORDER — GEMTESA 75 MG PO TABS: 75.0000 mg | ORAL_TABLET | Freq: Every day | ORAL | Status: DC

## 2024-01-31 NOTE — Patient Instructions (Addendum)
 For treatment of stress urinary incontinence, which is leakage with physical activity/movement/strainging/coughing, we discussed expectant management versus nonsurgical options versus surgery. Nonsurgical options include weight loss, physical therapy, as well as a pessary.  Surgical options include a midurethral sling, which is a synthetic mesh sling that acts like a hammock under the urethra to prevent leakage of urine, a Burch urethropexy, and transurethral injection of a bulking agent.   We discussed the symptoms of overactive bladder (OAB), which include urinary urgency, urinary frequency, night-time urination, with or without urge incontinence.  We discussed management including behavioral therapy (decreasing bladder irritants by following a bladder diet, urge suppression strategies, timed voids, bladder retraining), physical therapy, medication; and for refractory cases posterior tibial nerve stimulation, sacral neuromodulation, and intravesical botulinum toxin injection.   For Beta-3 agonist medication, we discussed the potential side effect of elevated blood pressure which is more likely to occur in individuals with uncontrolled hypertension. You were given samples for Gemtesa 75 mg.  It can take a month to start working so give it time, but if you have bothersome side effects call sooner and we can try a different medication.  Call us if you have trouble filling the prescription or if it's not covered by your insurance.  For treatment of recurrent urinary tract infections, we discussed management of recurrent UTIs including prophylaxis with a daily low dose antibiotic, transvaginal estrogen therapy, D-mannose, and cranberry supplements.  We discussed the role of diagnostic testing such as cystoscopy and upper tract imaging.     For vaginal atrophy (thinning of the vaginal tissue that can cause dryness and burning) and UTI prevention we discussed estrogen replacement in the form of vaginal cream.    Start vaginal estrogen therapy nightly for two weeks then 2 times weekly at night. This can be placed with your finger or an applicator inside the vagina and around the urethra.  Please let us know if the prescription is too expensive and we can look for alternative options.   Is vaginal estrogen therapy safe for me? Vaginal estrogen preparations act on the vaginal skin, and only a very tiny amount is absorbed into the bloodstream (0.01%).  They work in a similar way to hand or face cream.  There is minimal absorption and they are therefore perfectly safe. If you have had breast cancer and have persistent troublesome symptoms which aren't settling with vaginal moisturisers and lubricants, local estrogen treatment may be a possibility, but consultation with your oncologist should take place first.   Accidental Bowel Leakage:  - Treatment options include anti-diarrhea medication (loperamide/ Imodium OTC or prescription lomotil), fiber supplements, physical therapy, and possible sacral neuromodulation or surgery.     The origin of pelvic floor muscle spasm can be multifactorial, including primary, reactive to a different pain source, trauma, or even part of a centralized pain syndrome.Treatment options include pelvic floor physical therapy, local (vaginal) or oral  muscle relaxants, pelvic muscle trigger point injections or centrally acting pain medications.     Please call [phone] to schedule the earliest appointment for pelvic floor PT.

## 2024-01-31 NOTE — Progress Notes (Unsigned)
 New Patient Evaluation and Consultation  Referring Provider: [name], MD PCP: [name], MD Date of Service: 01/31/2024  SUBJECTIVE Chief Complaint: New Patient (Initial Visit) Tami [name] is a 68 y.o. female here today for chronic UTIs and pelvic pain. )  History of Present Illness: Tami [name] is a 68 y.o. Black or African-American female seen in consultation at the request of Dr Abner Greenspan for evaluation of pelvic pain.    Reports epigastric abdominal pain for 2 years and started having diarrhea.  Reports regurgitation 1 hour after eating with "fever" in her epigastric region without relief after prilosec Vaginal pain for 1 month, tylenol without relief. Oxycodone with some relief. Dicyclomine with minimal relief Denies pelvic floor PT in the past Chronic abdominal pain and diarrhea 3x/day and 3x/night, reports feeling weakness when she has frequent loose stools. Denies blood in stool or weight loss.  Discussed fecal transplant with Dr. Anne Hahn History of SBO Evaluated in ED 01/23/24, negative CT, takes mag citrate 200mg  twice a day for over a year.  History of multiple myeloma and history of mini strokes over 20 years ago Denies fiber supplementation or miralax Patient requested [name] in [place] to assist with care coordination, on the phone for counseling  Review of records significant for: History of C. Diff, SBO, peripheral neuropathy  Urinary Symptoms: Leaks urine with cough/ sneeze, laughing, lifting, with a full bladder, with movement to the bathroom, and with urgency started 1-2 months ago Leaks 2-3 time(s) per days with urgency Leaks 2x/day with activity, more bothersome Pad use: 2 pads per day.   Patient is bothered by UI symptoms.  Day time voids 3.  Nocturia: 2 times per night to void with OSA with CPAP use Reports water until bedtime with water at bedtime Denies LE edema Voiding dysfunction:  does not empty bladder well.  Patient does not use a  catheter to empty bladder.  When urinating, patient feels dribbling after finishing and to push on her belly or vagina to empty bladder Drinks: 32oz water per day, 8oz decaf coffee, 16oz tea, 15oz ginger ale for upset stomach   UTIs:  4-5  UTI's in the last year.   UTI symptoms describe vaginal discharge and itching, dysuria, symptoms resolve with antibiotics.  Denies history of blood in urine, kidney or bladder stones, pyelonephritis, bladder cancer, and kidney cancer Susceptibility data from last 90 days. Collected Specimen Info Organism AMPICILLIN Nitrofurantoin Susc lslt TELAVANCIN  01/05/24 Urine Enterococcus faecalis  S  S  S    Pelvic Organ Prolapse Symptoms:                  Patient Denies a feeling of a bulge the vaginal area.  Bowel Symptom: Bowel movements: 3-6 time(s) per day Stool consistency: loose Straining: no.  Splinting: no.  Incomplete evacuation: no.  Patient Admits to accidental bowel leakage / fecal incontinence  Occurs: 3 time(s) per day  Consistency with leakage: liquid Bowel regimen: none, Imodium daily, dicyclomine Last colonoscopy: , Results ***hemorrhoids, diverticulosis HM Colonoscopy          Upcoming     Colonoscopy (Every 10 Years) Next due on 12/12/2033    12/13/2023  Outside Procedure: PR COLONOSCOPY W/BIOPSY SINGLE/MULTIPLE   Only the first 1 history entries have been loaded, but more history exists.                Sexual Function Sexually active: no.  Sexual orientation: Straight Pain with sex: No  Pelvic Pain Admits to pelvic pain Location: at the vaginal opening Pain occurs: all day Prior pain treatment: none Improved by: laying down Worsened by: sitting   Past Medical History:  Past Medical History:  Diagnosis Date   Abnormal SPEP 07/26/2016   Arthritis    knees, hands   Back pain 07/14/2016   Bowel obstruction (HCC) 11/2014   C. 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   Multiple myeloma (HCC) 07/20/2018   Multiple myeloma not having achieved remission (HCC) 07/20/2018   Myalgia 12/31/2016   Obstructive sleep apnea 03/31/2015   Presence of permanent cardiac pacemaker    SOB (shortness of breath) 04/09/2016   Stroke (HCC)    TIAs   UTI (urinary tract infection)      Past Surgical History:   Past Surgical History:  Procedure Laterality Date   ABDOMINAL HYSTERECTOMY     menorraghia, 2006, total   BIOPSY  09/01/2022   Procedure: BIOPSY;  Surgeon: Lynann Bologna, MD;  Location: [facility];  Service: Gastroenterology;;   CHOLECYSTECTOMY     COLONOSCOPY  2018   [facility] per patient   COLONOSCOPY WITH PROPOFOL N/A 09/01/2022   Procedure: COLONOSCOPY WITH PROPOFOL;  Surgeon: Lynann Bologna, MD;  Location: [facility];  Service: Gastroenterology;  Laterality: N/A;   ESOPHAGOGASTRODUODENOSCOPY  2018   [facility]    ESOPHAGOGASTRODUODENOSCOPY (EGD) WITH PROPOFOL N/A 09/01/2022   Procedure: ESOPHAGOGASTRODUODENOSCOPY (EGD) WITH PROPOFOL;  Surgeon: Lynann Bologna, MD;  Location: [facility];  Service: Gastroenterology;  Laterality: N/A;   IR IMAGING GUIDED PORT INSERTION  06/29/2023   KNEE SURGERY     right, repair torn torn cartialage   PACEMAKER INSERTION  08/2014   changed last in 2023 per pt at [facility]   TONSILLECTOMY     TUBAL LIGATION       Past OB/GYN History: OB History  Gravida Para Term Preterm AB Living  5 4 4   4   SAB IAB Ectopic Multiple Live Births      5    # Outcome Date GA Lbr  Len/2nd Weight Sex Type Anes PTL Lv  5 Gravida      Vag-Spont   LIV  4 Term      Vag-Spont   LIV  3 Term     M Vag-Spont   ND  2 Term      Vag-Spont   LIV  1 Term     F Vag-Spont   LIV    Vaginal deliveries: 5,  Forceps/ Vacuum deliveries: 0, Cesarean section: 0 Menopausal: Yes, at age 27s, Admits to vaginal bleeding since menopause Contraception: s/p hysterectomy. Last pap smear.  Any history of abnormal pap smears: no.    Component Value Date/Time   DIAGPAP (A) 03/17/2022 1652    - Atypical squamous cells of undetermined significance (ASC-US)   HPVHIGH Negative 03/17/2022 1652   ADEQPAP Satisfactory for evaluation. 03/17/2022 1652    Medications: Patient has a current medication list which includes the following prescription(s): acetaminophen, acyclovir,  albuterol, albuterol, alprazolam, amlodipine, aspirin ec, budesonide, celecoxib, cetirizine, vitamin d3, cholestyramine light, cyclobenzaprine, dicyclomine, diphenoxylate-atropine, famotidine, ferrous sulfate, fluticasone, furosemide, hydrocodone bit-homatropine, hydroxyzine, hyoscyamine, lidocaine-prilocaine, loperamide, losartan, magnesium glycinate, meclizine, metoprolol succinate, montelukast, multiple vitamins-minerals, neomycin-polymyxin-hydrocortisone, nortriptyline, omeprazole, ondansetron, oxycodone-acetaminophen, pantoprazole, phenazopyridine, potassium chloride sa, prochlorperazine, pillow mask/adult, saccharomyces boulardii, scopolamine, sertraline, and spironolactone, and the following Facility-Administered Medications: acetaminophen, nitroglycerin, and sodium chloride flush.   Allergies: Patient is allergic to benazepril, ondansetron hcl, codeine, and morphine.   Social History:  Social History   Tobacco Use   Smoking status: Never    Passive exposure: Never   Smokeless tobacco: Never  Vaping Use   Vaping status: Never Used  Substance Use Topics   Alcohol use: No   Drug use: No    Relationship status:  single Patient lives with ***.   Patient is not employed. Regular exercise: No History of abuse: No  Family History:   Family History  Problem Relation Age of Onset   Diabetes Mother    Hypertension Mother    Heart disease Mother        s/p 1 stent   Hyperlipidemia Mother    Arthritis Mother    Colon cancer Father 83   Irritable bowel syndrome Sister    Hypertension Maternal Grandmother    Arthritis Maternal Grandmother    Heart disease Maternal Grandfather        MI   Hypertension Maternal Grandfather    Arthritis Maternal Grandfather    Hyperlipidemia Daughter    Hypertension Daughter    Hypertension Son    Esophageal cancer Neg Hx    Uterine cancer Neg Hx    Pancreatic cancer Neg Hx      Review of Systems: Review of Systems  Constitutional:  Positive for fever and malaise/fatigue. Negative for weight loss.  Respiratory:  Negative for cough, shortness of breath and wheezing.   Cardiovascular:  Positive for chest pain. Negative for palpitations and leg swelling.  Gastrointestinal:  Positive for abdominal pain. Negative for blood in stool.  Genitourinary:  Positive for dysuria, frequency (night time) and urgency. Negative for hematuria.  Skin:  Negative for rash.  Neurological:  Positive for weakness. Negative for dizziness and headaches.  Endo/Heme/Allergies:  Does not bruise/bleed easily.       Hot flashes  Psychiatric/Behavioral:  Positive for depression. The patient is not nervous/anxious.      OBJECTIVE Physical Exam: Vitals:   01/31/24 1446  BP: (!) 155/97  Pulse: (!) 106  Weight: 189 lb 6.4 oz (85.9 kg)  Height: 5\' 5"  (1.651 m)   Physical Exam Constitutional:      General: She is not in acute distress.    Appearance: Normal appearance.  Genitourinary:     Bladder and urethral meatus normal.     No lesions in the vagina.     Right Labia: No rash, tenderness, lesions, skin changes or Bartholin's cyst.    Left Labia: No tenderness, lesions, skin  changes, Bartholin's cyst or rash.       Vaginal discharge and bleeding (vaginal cuff) present.     No vaginal erythema, tenderness, ulceration or granulation tissue.     Anterior vaginal prolapse present.     Right Adnexa: not tender, not full and no mass present.    Left Adnexa: not tender, not full and no mass present.    Cervix is absent.     Uterus is absent.     Urethral meatus caruncle not present.    No urethral prolapse,  tenderness, mass, hypermobility, discharge or stress urinary incontinence with cough stress test present.     Bladder is not tender, urgency on palpation not present and masses not present.      Pelvic Floor: Levator muscle strength is 2/5.    Levator ani not tender, obturator internus not tender, no asymmetrical contractions present and no pelvic spasms present.    Symmetrical pelvic sensation, anal wink present and BC reflex present. Cardiovascular:     Rate and Rhythm: Normal rate.  Pulmonary:     Effort: Pulmonary effort is normal. No respiratory distress.  Abdominal:     General: There is no distension.     Palpations: There is no mass.     Tenderness: There is abdominal tenderness.     Hernia: No hernia is present.    Neurological:     Mental Status: She is alert.  Vitals reviewed. Exam conducted with a chaperone present.      POP-Q:   POP-Q                                               Aa                                               Ba                                                 C                                                Gh                                               Pb                                               tvl                                                Ap                                               Bp                                                 D  Rectal Exam:  Normal sphincter tone, {rectocele:24766} distal rectocele, enterocoele {DESC; PRESENT/NOT PRESENT:21021351}, no rectal  masses, {sign of:24767} dyssynergia when asking the patient to bear down.  Post-Void Residual (PVR) by Bladder Scan: In order to evaluate bladder emptying, we discussed obtaining a postvoid residual and patient agreed to this procedure.  Procedure: The ultrasound unit was placed on the patient's abdomen in the suprapubic region after the patient had voided.      Laboratory Results: Lab Results  Component Value Date   COLORU yellow 01/05/2024   CLARITYU clear 01/05/2024   GLUCOSEUR Negative 01/05/2024   BILIRUBINUR NEGATIVE 01/21/2024   KETONESU trace 01/05/2024   SPECGRAV 1.025 01/05/2024   RBCUR negative 01/05/2024   PHUR 5.0 01/05/2024   PROTEINUR NEGATIVE 01/21/2024   UROBILINOGEN 0.2 01/05/2024   LEUKOCYTESUR TRACE (A) 01/21/2024    Lab Results  Component Value Date   CREATININE 0.63 01/24/2024   CREATININE 0.68 01/21/2024   CREATININE 0.70 01/03/2024    Lab Results  Component Value Date   HGBA1C 5.4 01/24/2024    Lab Results  Component Value Date   HGB 13.3 01/24/2024     ASSESSMENT AND PLAN Ms. Winbush is a 68 y.o. with: No diagnosis found.  There are no diagnoses linked to this encounter.   Loleta Chance, MD

## 2024-02-01 ENCOUNTER — Encounter: Payer: Self-pay | Admitting: Obstetrics

## 2024-02-01 ENCOUNTER — Other Ambulatory Visit: Payer: Self-pay

## 2024-02-01 LAB — URINE CULTURE: Culture: <10000 — AB

## 2024-02-01 LAB — CERVICOVAGINAL ANCILLARY ONLY
Bacterial Vaginitis (gardnerella): POSITIVE — AB
Candida Glabrata: NEGATIVE
Candida Vaginitis: NEGATIVE
Comment: NEGATIVE
Comment: NEGATIVE
Comment: NEGATIVE

## 2024-02-01 MED ORDER — CLINDAMYCIN PHOSPHATE 2 % VA CREA: 1.0000 | TOPICAL_CREAM | Freq: Every day | VAGINAL | 0 refills | Status: AC

## 2024-02-01 NOTE — Assessment & Plan Note (Signed)
 For night time frequency: - avoid fluid intake 3 hours before bedtime - elevated feet during the day or use compression socks to reduce lower extremity swelling - switch your diuretic (e.g. furosemide/spironolactone) dosing to 2pm - continue CPAP for sleep apnea

## 2024-02-01 NOTE — Assessment & Plan Note (Signed)
-   normal bladder emptying 17mL - reproducible pain with palpation of pubic symphysis and musculoskeletal pain - encouraged pelvic floor PT amd weight reduction - UA + protein, pending culture to r/o UTI

## 2024-02-01 NOTE — Assessment & Plan Note (Addendum)
-   h/o TIAs - discussed increased risk of neurogenic bladder and refractory OAB symptoms - will need urodynamics and cystoscopy if refractory symptoms

## 2024-02-01 NOTE — Assessment & Plan Note (Addendum)
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   - - provided handout regarding vulvodynia, reviewed comfort measures and treatment options.  - Rx topical lidocaine 1g PRN pain up to 3x/day - consider Rx Amitriptyline 2.5%/ gabapentin 2.5%/ baclofen 2.5% in vaginal cream. Sent to compounding pharmacy for use daily - discussed conservative management options with cold compress PRN pain - pending pelvic floor PT appointment

## 2024-02-01 NOTE — Assessment & Plan Note (Signed)
-   chronic diarrhea managed by GI, offered fecal transplant - continue treatment of GERD and epigastric pain with PPI, dicyclomine - Treatment options include anti-diarrhea medication (loperamide/ Imodium OTC or prescription lomotil), fiber supplements, physical therapy, and possible sacral neuromodulation or surgery.   - referral sent for pelvic floor PT - encouraged squatting position for bowel movements

## 2024-02-01 NOTE — Assessment & Plan Note (Signed)
-   POCT UA + protein only, pending urine culture due to symptoms - stress more bothersome than urgency - For treatment of stress urinary incontinence,  non-surgical options include expectant management, weight loss, physical therapy, as well as a pessary.  Surgical options include a midurethral sling, Burch urethropexy, and transurethral injection of a bulking agent. - referral to pelvic floor PT, encouraged return to working out and weight reduction - We discussed the symptoms of overactive bladder (OAB), which include urinary urgency, urinary frequency, nocturia, with or without urge incontinence.  While we do not know the exact etiology of OAB, several treatment options exist. We discussed management including behavioral therapy (decreasing bladder irritants, urge suppression strategies, timed voids, bladder retraining), physical therapy, medication; for refractory cases posterior tibial nerve stimulation, sacral neuromodulation, and intravesical botulinum toxin injection.  For anticholinergic medications, we discussed the potential side effects of anticholinergics including dry eyes, dry mouth, constipation, cognitive impairment and urinary retention. For Beta-3 agonist medication, we discussed the potential side effect of elevated blood pressure which is more likely to occur in individuals with uncontrolled hypertension. - trial of Gemtesa with samples and Rx provided.  - discussed behavioral modification with caffeine reduction and timed voids

## 2024-02-01 NOTE — Assessment & Plan Note (Signed)
-   pending Nuswab - possibly due to atrophy vs. Infectious etiology  - repeat exam at follow-up.

## 2024-02-01 NOTE — Assessment & Plan Note (Signed)
-   For symptomatic vaginal atrophy options include lubrication with a water-based lubricant, personal hygiene measures and barrier protection against wetness, and estrogen replacement in the form of vaginal cream, vaginal tablets, or a time-released vaginal ring.   - resume low dose vaginal estrogen due to erythema at the vaginal cuff with blood tinged discharge - pending Nuswab to r/o infectious etiology - consider biopsy if persistent after vaginal estrogen

## 2024-02-01 NOTE — Addendum Note (Signed)
 Addended byWyatt Haste T on: 02/01/2024 02:31 PM   Modules accepted: Orders

## 2024-02-01 NOTE — Assessment & Plan Note (Addendum)
-   pending catheterized urine culture to minimize contamination due to diarrhea, UA + protein - started low dose vaginal estrogen to r/o atrophy - possibly due to neuropathic pain, referral to pelvic floor PT - topical lidocaine use and pyridium PRN discomfort

## 2024-02-06 DIAGNOSIS — G4733 Obstructive sleep apnea (adult) (pediatric): Secondary | ICD-10-CM | POA: Diagnosis not present

## 2024-02-07 ENCOUNTER — Other Ambulatory Visit: Payer: Self-pay

## 2024-02-07 ENCOUNTER — Ambulatory Visit (INDEPENDENT_AMBULATORY_CARE_PROVIDER_SITE_OTHER): Admitting: Internal Medicine

## 2024-02-07 ENCOUNTER — Encounter: Payer: Self-pay | Admitting: Internal Medicine

## 2024-02-07 VITALS — BP 140/85 | HR 88 | Temp 97.8°F | Wt 190.0 lb

## 2024-02-07 DIAGNOSIS — Z8619 Personal history of other infectious and parasitic diseases: Secondary | ICD-10-CM

## 2024-02-07 DIAGNOSIS — C9000 Multiple myeloma not having achieved remission: Secondary | ICD-10-CM | POA: Diagnosis not present

## 2024-02-07 DIAGNOSIS — K529 Noninfective gastroenteritis and colitis, unspecified: Secondary | ICD-10-CM

## 2024-02-08 ENCOUNTER — Other Ambulatory Visit: Payer: Self-pay | Admitting: Hematology & Oncology

## 2024-02-08 DIAGNOSIS — G4733 Obstructive sleep apnea (adult) (pediatric): Secondary | ICD-10-CM | POA: Diagnosis not present

## 2024-02-09 DIAGNOSIS — G4733 Obstructive sleep apnea (adult) (pediatric): Secondary | ICD-10-CM | POA: Diagnosis not present

## 2024-02-09 DIAGNOSIS — K222 Esophageal obstruction: Secondary | ICD-10-CM | POA: Diagnosis not present

## 2024-02-09 DIAGNOSIS — I5022 Chronic systolic (congestive) heart failure: Secondary | ICD-10-CM | POA: Diagnosis not present

## 2024-02-09 DIAGNOSIS — K529 Noninfective gastroenteritis and colitis, unspecified: Secondary | ICD-10-CM | POA: Diagnosis not present

## 2024-02-09 DIAGNOSIS — I447 Left bundle-branch block, unspecified: Secondary | ICD-10-CM | POA: Diagnosis not present

## 2024-02-10 ENCOUNTER — Telehealth: Payer: Self-pay

## 2024-02-10 ENCOUNTER — Telehealth: Payer: Self-pay | Admitting: Emergency Medicine

## 2024-02-10 NOTE — Telephone Encounter (Signed)
 Patient called the office stating after her application of Estrace yesterday, she noticed blood on the applicator. Tami [name] hasn't had this happen since she started using it. She wanted to make sure this wasn't anything abnormal. We discussed this could have been form her vaginal atrophy/low estrogen which is why we are using this course of treatment. Discussed stopping application with the applicator and to use her finger. We also covered that she hasn't used her treatment of clindamycin for BV. Instructed her to stop the estrogen for now and complete the clindamycin, then continue with the estrogen cream. Advised if bleeding continues or becomes worse to please call the office to inform us.

## 2024-02-10 NOTE — Telephone Encounter (Signed)
 Copied from CRM [id number]. Topic: General - Other >> Feb 10, 2024 10:25 AM Arley Phenix D wrote: Reason for CRM: Patient stated that she has diarrhea and has been having this issue since the middle of last week until now. Patient wants to know if Dr.Blyth can send in a request for her to get her c.diff checked.

## 2024-02-13 NOTE — Progress Notes (Signed)
 RFV: follow up for hx of cdifficile diarrhea and post infectious diarrhea  [name], female   DOB: 02/16/1956, 68 y.o.   MRN: [id number]  HPI Tami [name] is a 68yo [name] symptomatic plasma cell myeloma, has episodic transfusion needs. Also IV [name] for dehydration. She has not had recent diarrheal spell.she reports occassional diarreha depending on what she is eating. Last time her stool was checked was mid February for which it was negative. She is concerned that if she is having a recurrence of cdiff then she was wondering if she would be candidate for fecal transplant.   Outpatient Encounter Medications as of 02/07/2024  Medication Sig   acetaminophen (TYLENOL) 500 MG tablet Take 500 mg by mouth every 6 (six) hours as needed.   albuterol (PROVENTIL) (2.5 MG/3ML) 0.083% nebulizer solution Take 3 mLs (2.5 mg total) by nebulization every 6 (six) hours as needed for wheezing or shortness of breath.   albuterol (VENTOLIN HFA) 108 (90 Base) MCG/ACT inhaler Inhale 2 puffs into the lungs every 6 (six) hours as needed for wheezing or shortness of breath.   ALPRAZolam (XANAX) 0.25 MG tablet Take 1-2 tablets (0.25-0.5 mg total) by mouth 3 (three) times daily as needed for anxiety.   amLODipine (NORVASC) 5 MG tablet Take 5 mg by mouth daily.   aspirin EC 81 MG tablet Take 81 mg by mouth daily. Swallow whole.   budesonide (PULMICORT) 0.5 MG/2ML nebulizer solution Take 2 mLs (0.5 mg total) by nebulization 2 (two) times daily as needed (cough, wheezing or shortness of breath).   celecoxib (CELEBREX) 100 MG capsule Take 100 mg by mouth 2 (two) times daily.   cetirizine (ZYRTEC) 10 MG tablet Take 10 mg by mouth at bedtime.   Cholecalciferol (VITAMIN D3) 50 MCG (2000 UT) TABS Take 2,000 Units by mouth in the morning.   cholestyramine light (PREVALITE) 4 g packet Take 4 g by mouth 2 (two) times daily.   [EXPIRED] clindamycin (CLEOCIN) 2 % vaginal cream Place 1 Applicatorful vaginally at bedtime for 7  days. Place 5g cream with applicator in vagina nightly for 7 days   cyclobenzaprine (FLEXERIL) 5 MG tablet Take 1 tablet (5 mg total) by mouth 2 (two) times daily as needed for up to 10 doses for muscle spasms.   dicyclomine (BENTYL) 10 MG capsule Take 1 capsule (10 mg total) by mouth 4 (four) times daily -  before meals and at bedtime. cramping   diphenoxylate-atropine (LOMOTIL) 2.5-0.025 MG tablet Take 1 tablet by mouth 2 (two) times daily.   estradiol (ESTRACE) 0.1 MG/GM vaginal cream Place 0.5 g vaginally 2 (two) times a week. Place 0.5g nightly for two weeks then twice a week after   famotidine (PEPCID) 20 MG tablet Take 1 tablet (20 mg total) by mouth at bedtime.   ferrous sulfate (FEROSUL) 325 (65 FE) MG tablet Take 1 tablet (325 mg total) by mouth daily with breakfast.   fluticasone (FLONASE) 50 MCG/ACT nasal spray Place 2 sprays into both nostrils daily.   furosemide (LASIX) 20 MG tablet Take 20 mg by mouth daily as needed (fluid retention (feet swelling)).   HYDROcodone bit-homatropine (HYDROMET) 5-1.5 MG/5ML syrup Take 5 mLs by mouth every 8 (eight) hours as needed for cough.   hydrOXYzine (ATARAX) 10 MG tablet TAKE ONE (1) TABLET BY MOUTH 3 TIMES DAILY AS NEEDED FOR ITCHING   hyoscyamine (LEVSIN SL) 0.125 MG SL tablet Place 1 tablet (0.125 mg total) under the tongue every 4 (four) hours as needed.  lidocaine (XYLOCAINE) 5 % ointment Apply 1 Application topically as needed.   lidocaine-prilocaine (EMLA) cream Apply 1 Application topically as needed.   loperamide (IMODIUM) 2 MG capsule Take 4 mg by mouth in the morning and at bedtime.   losartan (COZAAR) 50 MG tablet Take 50 mg by mouth in the morning and at bedtime.   Magnesium Glycinate 100 MG CAPS Take 200 mg by mouth at bedtime.   meclizine (ANTIVERT) 12.5 MG tablet Take 12.5 mg by mouth 2 (two) times daily as needed for dizziness or nausea.   metoprolol succinate (TOPROL-XL) 50 MG 24 hr tablet Take 1 tablet (50 mg total) by mouth 2  (two) times daily. (Patient taking differently: Take 50 mg by mouth daily.)   montelukast (SINGULAIR) 10 MG tablet Take 1 tablet (10 mg total) by mouth at bedtime. Please start taking this on September 4.   Multiple Vitamins-Minerals (CENTRUM SILVER WOMEN 50+ PO) Take 1 tablet by mouth daily at 6 (six) AM.   neomycin-polymyxin-hydrocortisone (CORTISPORIN) OTIC solution Place 4 drops into both ears 4 (four) times daily.   nortriptyline (PAMELOR) 10 MG capsule Take 1 capsule (10 mg total) by mouth at bedtime.   omeprazole (PRILOSEC) 20 MG capsule SMARTSIG:1.0 Capsule(s) By Mouth Daily   ondansetron (ZOFRAN) 8 MG tablet Take 1 tablet (8 mg total) by mouth every 8 (eight) hours as needed for nausea or vomiting.   oxyCODONE-acetaminophen (PERCOCET) 5-325 MG tablet Take 2 tablets by mouth every 6 (six) hours as needed.   pantoprazole (PROTONIX) 40 MG tablet Take 1 tablet (40 mg total) by mouth daily.   phenazopyridine (PYRIDIUM) 200 MG tablet Take 1 tablet (200 mg total) by mouth 3 (three) times daily as needed for pain.   potassium chloride SA (KLOR-CON M) 20 MEQ tablet TAKE 1 TABLET BY MOUTH EVERY MONDAY, WEDNESDAY, AND FRIDAY.   prochlorperazine (COMPAZINE) 10 MG tablet Take 0.5 tablets (5 mg total) by mouth 2 (two) times daily as needed for nausea or vomiting.   Respiratory Therapy Supplies (PILLOW MASK/ADULT) MISC    Saccharomyces boulardii (FLORASTOR PO) Take 500 mg by mouth daily at 6 (six) AM.   scopolamine (TRANSDERM-SCOP) 1 MG/3DAYS Place 1 patch (1.5 mg total) onto the skin every 3 (three) days.   sertraline (ZOLOFT) 50 MG tablet Take 1 tablet (50 mg total) by mouth daily.   spironolactone (ALDACTONE) 25 MG tablet Take 25 mg by mouth daily.   Vibegron (GEMTESA) 75 MG TABS Take 1 tablet (75 mg total) by mouth daily.   Vibegron (GEMTESA) 75 MG TABS Take 1 tablet (75 mg total) by mouth daily.   [DISCONTINUED] acyclovir (ZOVIRAX) 400 MG tablet Take 1 tablet (400 mg total) by mouth 2 (two) times  daily.   Facility-Administered Encounter Medications as of 02/07/2024  Medication   acetaminophen (TYLENOL) tablet 650 mg   nitroGLYCERIN (NITROSTAT) SL tablet 0.4 mg   sodium chloride flush (NS) 0.9 % injection 10 mL     Patient Active Problem List   Diagnosis Date Noted   Urinary incontinence, mixed 01/31/2024   Nocturia 01/31/2024   Pelvic pain 01/31/2024   Incontinence of feces 01/31/2024   Vaginal atrophy 01/31/2024   Vaginal discharge 01/31/2024   Acute pain of left shoulder 09/03/2023   Chronic combined systolic and diastolic congestive heart failure (HCC) 03/28/2023   Intractable nausea and vomiting 03/27/2023   Asthma 12/20/2022   Diarrhea    Hypocalcemia 08/25/2022   Iron deficiency anemia due to chronic blood loss 05/28/2022   Chronic  diarrhea 05/27/2022   Soft tissue lesion 04/28/2022   Obesity (BMI 30-39.9) 04/28/2022   SBO (small bowel obstruction) (HCC) 04/27/2022   Dehydration 04/03/2022   Acute vaginitis 03/18/2022   Cervical cancer screening 03/18/2022   Dizziness 12/11/2021   Visual changes 11/26/2021   Ear fullness, bilateral 08/12/2021   Bilateral knee pain 08/08/2021   History of 2019 novel coronavirus disease (COVID-19) 12/06/2020   Vitamin deficiency 07/31/2020   Acute combined systolic and diastolic heart failure (HCC) 05/09/2020   Hypomagnesemia 03/04/2020   Thrombocytopenia (HCC) 03/04/2020   Dysphagia 03/04/2020   Constipation 03/04/2020   Sensation of pressure in bladder area 01/17/2020   Hyperglycemia 01/17/2020   Peripheral neuropathy 11/15/2019   Chronic migraine without aura, with intractable migraine, so stated, with status migrainosus 10/17/2019   ETD (Eustachian tube dysfunction), bilateral 09/12/2019   Urinary frequency 07/17/2019   Headache 07/17/2019   Palpitation 05/15/2019   Clostridium difficile diarrhea 09/04/2018   Thyroid nodule 08/26/2018   Multiple myeloma (HCC) 07/20/2018   Multiple myeloma not having achieved  remission (HCC) 07/20/2018   Hematuria 07/11/2018   Seasonal allergies 06/23/2018   Right shoulder pain 06/07/2018   Neck pain 06/07/2018   Paresthesias 06/07/2018   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   Pain in thoracic spine 03/18/2018   Thoracic back pain 02/15/2018   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   Hypokalemia 08/20/2017   Anemia 08/20/2017   Elevated sed rate 08/15/2017   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   Epigastric abdominal pain 03/22/2017   Myalgia 12/31/2016   Abnormal SPEP 07/26/2016   Low back pain 07/14/2016   Insomnia 12/29/2015   Tension headache 10/21/2015   Muscle spasms of neck 10/18/2015   Dysuria 10/13/2015   AP (abdominal pain) 07/07/2015   Cardiomyopathy (HCC) 04/19/2015   Chest pain 04/19/2015   Obstructive sleep apnea 03/31/2015   Congestive heart failure (HCC) 02/24/2015   Arthritis    Hyperlipidemia    Hypertension    Depression    Stroke (HCC)    GERD (gastroesophageal reflux disease)    Bowel obstruction (HCC) 11/09/2014     Health Maintenance Due  Topic Date Due   Pneumonia Vaccine 46+ Years old (4 of 4 - PPSV23 or PCV20) 06/02/2022   COVID-19 Vaccine (6 - 2024-25 season) 07/11/2023     Review of Systems 12 point ros is negative other than what is mentioned in hpi Physical Exam   BP (!) 140/85   Pulse 88   Temp 97.8 [name] (36.6 C) (Temporal)   Wt 190 lb (86.2 kg)   SpO2 98%   BMI 31.62 kg/m    Physical Exam  Constitutional:  oriented to person, place, and time. appears well-developed and well-nourished. No distress.  HENT: [place]/AT, PERRLA, no scleral icterus Mouth/Throat: Oropharynx is clear and moist. No oropharyngeal exudate.  Cardiovascular: Normal rate, regular rhythm and normal heart sounds. Exam reveals no gallop and no friction rub.  No murmur heard.  Pulmonary/Chest: Effort normal and breath sounds normal. No respiratory distress.  has no wheezes.  Neck = supple, no nuchal  rigidity Abdominal: Soft. Bowel sounds are normal.  exhibits no distension. There is no tenderness.  Lymphadenopathy: no cervical adenopathy. No axillary adenopathy Neurological: alert and oriented to person, place, and time.  Skin: Skin is warm and dry. No rash noted. No erythema.  Psychiatric: a normal mood and affect.  behavior is normal.   Lab Results  Component Value Date   LABRPR NON REACTIVE 08/23/2020  CBC Lab Results  Component Value Date   WBC 3.6 (L) 01/24/2024   RBC 4.35 01/24/2024   HGB 13.3 01/24/2024   HCT 40.2 01/24/2024   PLT 166.0 01/24/2024   MCV 92.3 01/24/2024   MCH 30.1 01/21/2024   MCHC 33.2 01/24/2024   RDW 14.5 01/24/2024   LYMPHSABS 0.8 01/24/2024   MONOABS 0.3 01/24/2024   EOSABS 0.1 01/24/2024    BMET Lab Results  Component Value Date   NA 143 01/24/2024   K 4.1 01/24/2024   CL 106 01/24/2024   CO2 29 01/24/2024   GLUCOSE 74 01/24/2024   BUN 15 01/24/2024   CREATININE 0.63 01/24/2024   CALCIUM 9.7 01/24/2024   GFRNONAA >60 01/21/2024   GFRAA >60 08/10/2020      Assessment and Plan   Hx of cdifficile colitis, and intermittent post infectious diarrhea = sometimes difficult to discer nif having reinfection. If having profuse diarrhea, will recommend to recheck for cdifficile. If having reinfection, then we will consider doing vowst in addition to fidaxomicin.   Intermittent chronic diarrhea = For now supportive care.  Plasma cell myeloma  = follows up closely with dr Myna Hidalgo from management and supportive care.

## 2024-02-14 ENCOUNTER — Telehealth: Payer: Self-pay | Admitting: Emergency Medicine

## 2024-02-14 ENCOUNTER — Ambulatory Visit: Payer: 59 | Admitting: Internal Medicine

## 2024-02-14 NOTE — Telephone Encounter (Signed)
 Copied from CRM [id number]. Topic: Clinical - Medical Advice >> Feb 14, 2024  4:41 PM Melissa C wrote: Reason for CRM: patient following up regarding message she left on 4/3. She stated she hadn't heard anything yet from the office. Please advise with patient. Thank you.

## 2024-02-15 ENCOUNTER — Other Ambulatory Visit: Payer: Self-pay | Admitting: Emergency Medicine

## 2024-02-15 DIAGNOSIS — R197 Diarrhea, unspecified: Secondary | ICD-10-CM

## 2024-02-15 NOTE — Telephone Encounter (Signed)
 Spoke with patient this morning. She is coming to the office to pick up kit for stool sample.

## 2024-02-15 NOTE — Telephone Encounter (Signed)
 Called and spoke with patient. She is going to come to the office and pick up a kit from the lab and bring her stool sample back tomorrow

## 2024-02-16 ENCOUNTER — Encounter (HOSPITAL_BASED_OUTPATIENT_CLINIC_OR_DEPARTMENT_OTHER): Payer: Self-pay | Admitting: Urology

## 2024-02-16 ENCOUNTER — Emergency Department (HOSPITAL_BASED_OUTPATIENT_CLINIC_OR_DEPARTMENT_OTHER)
Admission: EM | Admit: 2024-02-16 | Discharge: 2024-02-16 | Disposition: A | Discharge status: Home / Self Care | Attending: Emergency Medicine | Admitting: Emergency Medicine

## 2024-02-16 ENCOUNTER — Other Ambulatory Visit: Payer: Self-pay

## 2024-02-16 DIAGNOSIS — Z79899 Other long term (current) drug therapy: Secondary | ICD-10-CM | POA: Diagnosis not present

## 2024-02-16 DIAGNOSIS — I110 Hypertensive heart disease with heart failure: Secondary | ICD-10-CM | POA: Insufficient documentation

## 2024-02-16 DIAGNOSIS — Z8673 Personal history of transient ischemic attack (TIA), and cerebral infarction without residual deficits: Secondary | ICD-10-CM | POA: Insufficient documentation

## 2024-02-16 DIAGNOSIS — Z8579 Personal history of other malignant neoplasms of lymphoid, hematopoietic and related tissues: Secondary | ICD-10-CM | POA: Diagnosis not present

## 2024-02-16 DIAGNOSIS — I509 Heart failure, unspecified: Secondary | ICD-10-CM | POA: Insufficient documentation

## 2024-02-16 DIAGNOSIS — E860 Dehydration: Secondary | ICD-10-CM | POA: Diagnosis not present

## 2024-02-16 DIAGNOSIS — Z7982 Long term (current) use of aspirin: Secondary | ICD-10-CM | POA: Diagnosis not present

## 2024-02-16 DIAGNOSIS — R197 Diarrhea, unspecified: Secondary | ICD-10-CM | POA: Insufficient documentation

## 2024-02-16 LAB — CBC WITH DIFFERENTIAL/PLATELET
Abs Immature Granulocytes: 0.01 10*3/uL (ref 0.00–0.07)
Basophils Absolute: 0 10*3/uL (ref 0.0–0.1)
Basophils Relative: 1 %
Eosinophils Absolute: 0.1 10*3/uL (ref 0.0–0.5)
Eosinophils Relative: 2 %
HCT: 37.1 % (ref 36.0–46.0)
Hemoglobin: 12.3 g/dL (ref 12.0–15.0)
Immature Granulocytes: 0 %
Lymphocytes Relative: 28 %
Lymphs Abs: 1.1 10*3/uL (ref 0.7–4.0)
MCH: 30.4 pg (ref 26.0–34.0)
MCHC: 33.2 g/dL (ref 30.0–36.0)
MCV: 91.8 fL (ref 80.0–100.0)
Monocytes Absolute: 0.3 10*3/uL (ref 0.1–1.0)
Monocytes Relative: 7 %
Neutro Abs: 2.5 10*3/uL (ref 1.7–7.7)
Neutrophils Relative %: 62 %
Platelets: 140 10*3/uL — ABNORMAL LOW (ref 150–400)
RBC: 4.04 MIL/uL (ref 3.87–5.11)
RDW: 12.9 % (ref 11.5–15.5)
WBC: 4 10*3/uL (ref 4.0–10.5)
nRBC: 0 % (ref 0.0–0.2)

## 2024-02-16 LAB — COMPREHENSIVE METABOLIC PANEL WITH GFR
ALT: 21 U/L (ref 0–44)
AST: 24 U/L (ref 15–41)
Albumin: 3.5 g/dL (ref 3.5–5.0)
Alkaline Phosphatase: 45 U/L (ref 38–126)
Anion gap: 8 (ref 5–15)
BUN: 16 mg/dL (ref 8–23)
CO2: 24 mmol/L (ref 22–32)
Calcium: 9.1 mg/dL (ref 8.9–10.3)
Chloride: 109 mmol/L (ref 98–111)
Creatinine, Ser: 0.66 mg/dL (ref 0.44–1.00)
GFR, Estimated: >60 mL/min (ref >60)
Glucose, Bld: 116 mg/dL — ABNORMAL HIGH (ref 70–99)
Potassium: 4.1 mmol/L (ref 3.5–5.1)
Sodium: 141 mmol/L (ref 135–145)
Total Bilirubin: 0.6 mg/dL (ref 0.0–1.2)
Total Protein: 7.1 g/dL (ref 6.5–8.1)

## 2024-02-16 LAB — LIPASE, BLOOD: Lipase: 30 U/L (ref 11–51)

## 2024-02-16 MED ORDER — LACTATED RINGERS IV BOLUS
1000.0000 mL | Freq: Once | INTRAVENOUS | Status: AC
  Administered 2024-02-16: 1000 mL via INTRAVENOUS

## 2024-02-16 NOTE — ED Provider Notes (Signed)
 [place] [facility] Provider Note   CSN: [id number] Arrival date & time: 02/16/24  1611     History  Chief Complaint  Patient presents with   Diarrhea    Tami [name] is a 68 y.o. female.   Diarrhea    Patient has a history of plasma cell myeloma CHF bowel obstruction acid reflux hyperlipidemia hypertension depression, stroke, myalgia, low back pain.  Patient also has been having issues with diarrhea ongoing for several weeks.  Patient states she has been having 3-4 episodes of loose stool per day.  Patient states she has seen a GI doctor.  She is also seeing her primary care doctor.  Patient has history of C. difficile and follow-up for this issue with infectious disease on March 31.  Patient states she followed up with her primary care doctor and has had stool studies ordered.  Patient has provided a sample but does not have the results yet.  Patient states she is feeling weak.  She denies any abdominal discomfort.  No vomiting.  No blood in her stool  Home Medications Prior to Admission medications   Medication Sig Start Date End Date Taking? Authorizing Provider  acetaminophen (TYLENOL) 500 MG tablet Take 500 mg by mouth every 6 (six) hours as needed.    [provider]  acyclovir (ZOVIRAX) 400 MG tablet TAKE ONE (1) TABLET BY MOUTH TWO (2) TIMES DAILY 02/08/24   Josph Macho, MD  albuterol (PROVENTIL) (2.5 MG/3ML) 0.083% nebulizer solution Take 3 mLs (2.5 mg total) by nebulization every 6 (six) hours as needed for wheezing or shortness of breath. 12/14/23   Martina Sinner, MD  albuterol (VENTOLIN HFA) 108 (90 Base) MCG/ACT inhaler Inhale 2 puffs into the lungs every 6 (six) hours as needed for wheezing or shortness of breath. 12/14/23   Martina Sinner, MD  ALPRAZolam Prudy Feeler) 0.25 MG tablet Take 1-2 tablets (0.25-0.5 mg total) by mouth 3 (three) times daily as needed for anxiety. 06/14/23   [name], MD  amLODipine  (NORVASC) 5 MG tablet Take 5 mg by mouth daily. 09/14/23   [provider]  aspirin EC 81 MG tablet Take 81 mg by mouth daily. Swallow whole.    [provider]  budesonide (PULMICORT) 0.5 MG/2ML nebulizer solution Take 2 mLs (0.5 mg total) by nebulization 2 (two) times daily as needed (cough, wheezing or shortness of breath). 12/14/23   Martina Sinner, MD  celecoxib (CELEBREX) 100 MG capsule Take 100 mg by mouth 2 (two) times daily. 06/07/23   [provider]  cetirizine (ZYRTEC) 10 MG tablet Take 10 mg by mouth at bedtime.    [provider]  Cholecalciferol (VITAMIN D3) 50 MCG (2000 UT) TABS Take 2,000 Units by mouth in the morning.    [provider]  cholestyramine light (PREVALITE) 4 g packet Take 4 g by mouth 2 (two) times daily.    [provider]  cyclobenzaprine (FLEXERIL) 5 MG tablet Take 1 tablet (5 mg total) by mouth 2 (two) times daily as needed for up to 10 doses for muscle spasms. 01/03/24   Curatolo, Adam, DO  dicyclomine (BENTYL) 10 MG capsule Take 1 capsule (10 mg total) by mouth 4 (four) times daily -  before meals and at bedtime. cramping 12/02/23 03/01/24  [name], MD  diphenoxylate-atropine (LOMOTIL) 2.5-0.025 MG tablet Take 1 tablet by mouth 2 (two) times daily. 01/24/24   [name], MD  estradiol (  ESTRACE) 0.1 MG/GM vaginal cream Place 0.5 g vaginally 2 (two) times a week. Place 0.5g nightly for two weeks then twice a week after 02/03/24   Wyatt Haste T, MD  famotidine (PEPCID) 20 MG tablet Take 1 tablet (20 mg total) by mouth at bedtime. 09/16/23   [name], MD  ferrous sulfate (FEROSUL) 325 (65 FE) MG tablet Take 1 tablet (325 mg total) by mouth daily with breakfast. 01/19/24   [name], MD  fluticasone (FLONASE) 50 MCG/ACT nasal spray Place 2 sprays into both nostrils daily. 01/07/20   Joy, Shawn C, PA-C  furosemide (LASIX) 20 MG tablet Take 20 mg by mouth daily as needed (fluid retention (feet  swelling)). 07/03/20   [provider]  HYDROcodone bit-homatropine (HYDROMET) 5-1.5 MG/5ML syrup Take 5 mLs by mouth every 8 (eight) hours as needed for cough. 09/20/23   [name], MD  hydrOXYzine (ATARAX) 10 MG tablet TAKE ONE (1) TABLET BY MOUTH 3 TIMES DAILY AS NEEDED FOR ITCHING 03/03/23   [name], MD  hyoscyamine (LEVSIN SL) 0.125 MG SL tablet Place 1 tablet (0.125 mg total) under the tongue every 4 (four) hours as needed. 01/24/24   [name], MD  lidocaine (XYLOCAINE) 5 % ointment Apply 1 Application topically as needed. 01/31/24   Loleta Chance, MD  lidocaine-prilocaine (EMLA) cream Apply 1 Application topically as needed. 07/01/23   Josph Macho, MD  loperamide (IMODIUM) 2 MG capsule Take 4 mg by mouth in the morning and at bedtime.    [provider]  losartan (COZAAR) 50 MG tablet Take 50 mg by mouth in the morning and at bedtime. 04/14/21   [provider]  Magnesium Glycinate 100 MG CAPS Take 200 mg by mouth at bedtime. 09/21/22   [provider]  meclizine (ANTIVERT) 12.5 MG tablet Take 12.5 mg by mouth 2 (two) times daily as needed for dizziness or nausea. 01/28/22   [provider]  metoprolol succinate (TOPROL-XL) 50 MG 24 hr tablet Take 1 tablet (50 mg total) by mouth 2 (two) times daily. Patient taking differently: Take 50 mg by mouth daily. 02/23/23   [name], MD  montelukast (SINGULAIR) 10 MG tablet Take 1 tablet (10 mg total) by mouth at bedtime. Please start taking this on September 4. 07/01/23   Josph Macho, MD  Multiple Vitamins-Minerals (CENTRUM SILVER WOMEN 50+ PO) Take 1 tablet by mouth daily at 6 (six) AM.    [provider]  neomycin-polymyxin-hydrocortisone (CORTISPORIN) OTIC solution Place 4 drops into both ears 4 (four) times daily. 07/30/23   Josph Macho, MD  nortriptyline (PAMELOR) 10 MG capsule Take 1 capsule (10 mg total) by mouth at bedtime. 06/14/23   [name], MD   omeprazole (PRILOSEC) 20 MG capsule SMARTSIG:1.0 Capsule(s) By Mouth Daily 08/19/23   [provider]  ondansetron (ZOFRAN) 8 MG tablet Take 1 tablet (8 mg total) by mouth every 8 (eight) hours as needed for nausea or vomiting. 07/14/23   Josph Macho, MD  oxyCODONE-acetaminophen (PERCOCET) 5-325 MG tablet Take 2 tablets by mouth every 6 (six) hours as needed. 08/07/23   Charlynne Pander, MD  pantoprazole (PROTONIX) 40 MG tablet Take 1 tablet (40 mg total) by mouth daily. 09/03/23   Sandford Craze, NP  phenazopyridine (PYRIDIUM) 200 MG tablet Take 1 tablet (200 mg total) by mouth 3 (three) times daily as needed for pain. 01/31/24   Loleta Chance, MD  potassium  chloride SA (KLOR-CON M) 20 MEQ tablet TAKE 1 TABLET BY MOUTH EVERY MONDAY, WEDNESDAY, AND FRIDAY. 10/25/23   [name], MD  prochlorperazine (COMPAZINE) 10 MG tablet Take 0.5 tablets (5 mg total) by mouth 2 (two) times daily as needed for nausea or vomiting. 12/02/23   [name], MD  Respiratory Therapy Supplies (PILLOW MASK/ADULT) MISC  09/29/23   [provider]  Saccharomyces boulardii (FLORASTOR PO) Take 500 mg by mouth daily at 6 (six) AM.    [provider]  scopolamine (TRANSDERM-SCOP) 1 MG/3DAYS Place 1 patch (1.5 mg total) onto the skin every 3 (three) days. 07/01/23   Josph Macho, MD  sertraline (ZOLOFT) 50 MG tablet Take 1 tablet (50 mg total) by mouth daily. 06/14/23   [name], MD  spironolactone (ALDACTONE) 25 MG tablet Take 25 mg by mouth daily.    [provider]  Vibegron (GEMTESA) 75 MG TABS Take 1 tablet (75 mg total) by mouth daily. 01/31/24   Loleta Chance, MD  Vibegron (GEMTESA) 75 MG TABS Take 1 tablet (75 mg total) by mouth daily. 01/31/24   Loleta Chance, MD      Allergies    Benazepril, Ondansetron hcl, Codeine, and Morphine    Review of Systems   Review of Systems  Gastrointestinal:  Positive for diarrhea.    Physical Exam Updated Vital Signs BP (!)  169/114   Pulse 74   Temp 98.3 F (36.8 C)   Resp 17   Ht 1.651 m (5\' 5" )   Wt 86.1 kg   SpO2 98%   BMI 31.59 kg/m  Physical Exam Vitals and nursing note reviewed.  Constitutional:      General: She is not in acute distress.    Appearance: She is well-developed.  HENT:     Head: Normocephalic and atraumatic.     Right Ear: External ear normal.     Left Ear: External ear normal.  Eyes:     General: No scleral icterus.       Right eye: No discharge.        Left eye: No discharge.     Conjunctiva/sclera: Conjunctivae normal.  Neck:     Trachea: No tracheal deviation.  Cardiovascular:     Rate and Rhythm: Normal rate and regular rhythm.  Pulmonary:     Effort: Pulmonary effort is normal. No respiratory distress.     Breath sounds: Normal breath sounds. No stridor. No wheezing or rales.  Abdominal:     General: Bowel sounds are normal. There is no distension.     Palpations: Abdomen is soft.     Tenderness: There is no abdominal tenderness. There is no guarding or rebound.  Musculoskeletal:        General: No tenderness or deformity.     Cervical back: Neck supple.  Skin:    General: Skin is warm and dry.     Findings: No rash.  Neurological:     General: No focal deficit present.     Mental Status: She is alert.     Cranial Nerves: No cranial nerve deficit, dysarthria or facial asymmetry.     Sensory: No sensory deficit.     Motor: No abnormal muscle tone or seizure activity.     Coordination: Coordination normal.  Psychiatric:        Mood and Affect: Mood normal.     ED Results / Procedures / Treatments   Labs (all labs ordered are listed, but  only abnormal results are displayed) Labs Reviewed  COMPREHENSIVE METABOLIC PANEL WITH GFR - Abnormal; Notable for the following components:      Result Value   Glucose, Bld 116 (*)    All other components within normal limits  CBC WITH DIFFERENTIAL/PLATELET - Abnormal; Notable for the following components:   Platelets  140 (*)    All other components within normal limits  LIPASE, BLOOD    EKG None  Radiology No results found.  Procedures Procedures    Medications Ordered in ED Medications  lactated ringers bolus 1,000 mL (1,000 mLs Intravenous New Bag/Given 02/16/24 1748)    ED Course/ Medical Decision Making/ A&P Clinical Course as of 02/16/24 1835  Wed Feb 16, 2024  1811 CBC with Diff(!) nl [JK]  1812 Comprehensive metabolic panel(!) nl [JK]  1812 Lipase, blood nl [JK]    Clinical Course User Index [JK] Linwood Dibbles, MD                                 Medical Decision Making Problems Addressed: Dehydration: acute illness or injury Diarrhea, unspecified type: chronic illness or injury with exacerbation, progression, or side effects of treatment  Amount and/or Complexity of Data Reviewed Labs: ordered. Decision-making details documented in ED Course.   Patient presented to the ED for evaluation of persistent diarrhea.  Patient has been having issues recurrently.  She has been seen by her primary care doctor GI as well as infectious disease.  Patient states she does have a pending stool study.  She has not heard the results.  Patient states she started to feel dehydrated.  She is not having any pain.  No fevers or chills.  ED workup reassuring.  Abdominal exam is benign.  Low suspicion for diverticulitis colitis.  Patient's laboratory test did not show any signs of severe dehydration or electrolyte abnormalities.  Patient was treated with IV fluids and her symptoms improved.  She is stable for outpatient follow-up with her specialist and primary care doctor.       Final Clinical Impression(s) / ED Diagnoses Final diagnoses:  Diarrhea, unspecified type  Dehydration    Rx / DC Orders ED Discharge Orders     None         Linwood Dibbles, MD 02/16/24 [phone]

## 2024-02-16 NOTE — ED Triage Notes (Signed)
 Pt states has been having diarrhea again and feels dehydrated  States started 3 weeks ago Has chronic diarrhea

## 2024-02-16 NOTE — Discharge Instructions (Addendum)
 Follow-up with your primary care doctor and infectious disease doctor as planned.  Continue to drink fluids to try to stay well-hydrated.  Your blood pressure was also elevated today.  Continue your medications and follow-up with your primary care doctor to have that rechecked.

## 2024-02-17 ENCOUNTER — Other Ambulatory Visit (INDEPENDENT_AMBULATORY_CARE_PROVIDER_SITE_OTHER)

## 2024-02-17 ENCOUNTER — Telehealth: Payer: Self-pay

## 2024-02-17 DIAGNOSIS — R197 Diarrhea, unspecified: Secondary | ICD-10-CM | POA: Diagnosis not present

## 2024-02-17 NOTE — Transitions of Care (Post Inpatient/ED Visit) (Unsigned)
   02/17/2024  Name: Tami [name] MRN: [id number] DOB: 1956/11/03  Today's TOC FU Call Status: Today's TOC FU Call Status:: Unsuccessful Call (1st Attempt) Unsuccessful Call (1st Attempt) Date: 02/17/24  Attempted to reach the patient regarding the most recent Inpatient/ED visit.  Follow Up Plan: No further outreach attempts will be made at this time. We have been unable to contact the patient.  Signature  Agnes Lawrence, CMA (AAMA)  [facility] [phone]

## 2024-02-18 ENCOUNTER — Inpatient Hospital Stay

## 2024-02-18 ENCOUNTER — Encounter: Payer: Self-pay | Admitting: Family

## 2024-02-18 ENCOUNTER — Telehealth: Payer: Self-pay | Admitting: Emergency Medicine

## 2024-02-18 ENCOUNTER — Encounter: Payer: Self-pay | Admitting: Hematology & Oncology

## 2024-02-18 ENCOUNTER — Inpatient Hospital Stay: Admitting: Family

## 2024-02-18 ENCOUNTER — Inpatient Hospital Stay: Attending: Hematology & Oncology

## 2024-02-18 VITALS — BP 157/78

## 2024-02-18 VITALS — BP 137/93 | HR 79 | Temp 97.6°F | Resp 18 | Wt 193.1 lb

## 2024-02-18 DIAGNOSIS — D801 Nonfamilial hypogammaglobulinemia: Secondary | ICD-10-CM

## 2024-02-18 DIAGNOSIS — C9000 Multiple myeloma not having achieved remission: Secondary | ICD-10-CM | POA: Diagnosis not present

## 2024-02-18 DIAGNOSIS — E860 Dehydration: Secondary | ICD-10-CM

## 2024-02-18 DIAGNOSIS — C9002 Multiple myeloma in relapse: Secondary | ICD-10-CM

## 2024-02-18 LAB — CBC WITH DIFFERENTIAL (CANCER CENTER ONLY)
Abs Immature Granulocytes: 0.01 10*3/uL (ref 0.00–0.07)
Basophils Absolute: 0 10*3/uL (ref 0.0–0.1)
Basophils Relative: 1 %
Eosinophils Absolute: 0.1 10*3/uL (ref 0.0–0.5)
Eosinophils Relative: 3 %
HCT: 35.8 % — ABNORMAL LOW (ref 36.0–46.0)
Hemoglobin: 11.9 g/dL — ABNORMAL LOW (ref 12.0–15.0)
Immature Granulocytes: 0 %
Lymphocytes Relative: 24 %
Lymphs Abs: 1 10*3/uL (ref 0.7–4.0)
MCH: 30.3 pg (ref 26.0–34.0)
MCHC: 33.2 g/dL (ref 30.0–36.0)
MCV: 91.1 fL (ref 80.0–100.0)
Monocytes Absolute: 0.4 10*3/uL (ref 0.1–1.0)
Monocytes Relative: 9 %
Neutro Abs: 2.6 10*3/uL (ref 1.7–7.7)
Neutrophils Relative %: 63 %
Platelet Count: 137 10*3/uL — ABNORMAL LOW (ref 150–400)
RBC: 3.93 MIL/uL (ref 3.87–5.11)
RDW: 13 % (ref 11.5–15.5)
WBC Count: 4.2 10*3/uL (ref 4.0–10.5)
nRBC: 0 % (ref 0.0–0.2)

## 2024-02-18 LAB — CMP (CANCER CENTER ONLY)
ALT: 15 U/L (ref 0–44)
AST: 18 U/L (ref 15–41)
Albumin: 4.2 g/dL (ref 3.5–5.0)
Alkaline Phosphatase: 48 U/L (ref 38–126)
Anion gap: 9 (ref 5–15)
BUN: 16 mg/dL (ref 8–23)
CO2: 27 mmol/L (ref 22–32)
Calcium: 9.3 mg/dL (ref 8.9–10.3)
Chloride: 107 mmol/L (ref 98–111)
Creatinine: 0.65 mg/dL (ref 0.44–1.00)
GFR, Estimated: >60 mL/min (ref >60)
Glucose, Bld: 105 mg/dL — ABNORMAL HIGH (ref 70–99)
Potassium: 3.7 mmol/L (ref 3.5–5.1)
Sodium: 143 mmol/L (ref 135–145)
Total Bilirubin: 0.7 mg/dL (ref 0.0–1.2)
Total Protein: 6.6 g/dL (ref 6.5–8.1)

## 2024-02-18 LAB — LACTATE DEHYDROGENASE: LDH: 210 U/L — ABNORMAL HIGH (ref 98–192)

## 2024-02-18 MED ORDER — SODIUM CHLORIDE 0.9 % IV SOLN: Freq: Once | INTRAVENOUS | Status: AC

## 2024-02-18 MED ORDER — SODIUM CHLORIDE 0.9% FLUSH
10.0000 mL | Freq: Once | INTRAVENOUS | Status: AC
  Administered 2024-02-18: 10 mL

## 2024-02-18 MED ORDER — HEPARIN SOD (PORK) LOCK FLUSH 100 UNIT/ML IV SOLN
500.0000 [IU] | Freq: Once | INTRAVENOUS | Status: AC
  Administered 2024-02-18: 500 [IU] via INTRAVENOUS

## 2024-02-18 NOTE — Patient Instructions (Signed)

## 2024-02-18 NOTE — Telephone Encounter (Signed)
 Copied from CRM [id number]. Topic: General - Other >> Feb 17, 2024  3:43 PM Fredrich Romans wrote: Reason for CRM: [place] [facility] called in stating that they will be faxing over medical recommendations for patient.

## 2024-02-18 NOTE — Progress Notes (Signed)
 Hematology and Oncology Follow Up Visit  Tami [name] [id number] 06-Mar-1956 68 y.o. 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 is here today for follow-up and fluids. Her myeloma counts have been holding steady. M-spike was 0.4 mg/dL. IgG level 882 and kappa light chains 5.1 mg/L.  She was in the ED with fatigue and diarrhea earlier this week.  She states that after receiving fluids she is feeling much better and her diarrhea has stopped for now. She is followed by GI as well as ID for history of C Diff.  She is followed by Gynecology for recent BV treatment.  No fever, chills, n/v, cough, rash, dizziness, SOB, chest pain, palpitations, abdominal pain or changes in bowel or bladder habits.  No swelling, tenderness, numbness or tingling in her extremities.  No falls or syncope.  Appetite and hydration are good. Weight is stable at 193 lbs.   ECOG Performance Status: 1 - Symptomatic but completely ambulatory  Medications:  Allergies as of 02/18/2024       Reactions   Benazepril Anaphylaxis, Swelling, Hives   angioedema Throat and lip swelling   Ondansetron Hcl Hives   Redness and hives post IV admin on 07/05/17   Codeine Nausea And Vomiting   Morphine Hives   Redness and hives noted post IV admin on 07/05/17        Medication List        Accurate as of February 18, 2024  8:47 AM. If you have any questions, ask your nurse or doctor.          acetaminophen 500 MG tablet Commonly known as: TYLENOL Take 500 mg by mouth  every 6 (six) hours as needed.   acyclovir 400 MG tablet Commonly known as: ZOVIRAX TAKE ONE (1) TABLET BY MOUTH TWO (2) TIMES DAILY   albuterol (2.5 MG/3ML) 0.083% nebulizer solution Commonly known as: PROVENTIL Take 3 mLs (2.5 mg total) by nebulization every 6 (six) hours as needed for wheezing or shortness of breath.   albuterol 108 (90 Base) MCG/ACT inhaler Commonly known as: VENTOLIN HFA Inhale 2 puffs into the lungs every 6 (six) hours as needed for wheezing or shortness of breath.   ALPRAZolam 0.25 MG tablet Commonly known as: XANAX Take 1-2 tablets (0.25-0.5 mg total) by mouth 3 (three) times daily as needed for anxiety.   amLODipine 5 MG tablet Commonly known as: NORVASC Take 5 mg by mouth daily.   aspirin EC 81 MG tablet Take 81 mg by mouth daily. Swallow whole.   budesonide 0.5 MG/2ML nebulizer solution Commonly known as: Pulmicort Take 2 mLs (0.5 mg total) by nebulization 2 (two) times daily as needed (cough, wheezing or shortness of breath).   celecoxib 100 MG capsule Commonly known as: CELEBREX Take 100 mg by mouth 2 (two) times daily.   CENTRUM SILVER WOMEN 50+ PO Take 1 tablet by mouth daily at 6 (six) AM.   cetirizine 10 MG tablet Commonly known as: ZYRTEC Take 10 mg by  mouth at bedtime.   cholestyramine light 4 g packet Commonly known as: PREVALITE Take 4 g by mouth 2 (two) times daily.   cyclobenzaprine 5 MG tablet Commonly known as: FLEXERIL Take 1 tablet (5 mg total) by mouth 2 (two) times daily as needed for up to 10 doses for muscle spasms.   dicyclomine 10 MG capsule Commonly known as: BENTYL Take 1 capsule (10 mg total) by mouth 4 (four) times daily -  before meals and at bedtime. cramping   diphenoxylate-atropine 2.5-0.025 MG tablet Commonly known as: Lomotil Take 1 tablet by mouth 2 (two) times daily.   estradiol 0.1 MG/GM vaginal cream Commonly known as: ESTRACE Place 0.5 g vaginally 2 (two) times a week. Place 0.5g nightly for two  weeks then twice a week after   famotidine 20 MG tablet Commonly known as: PEPCID Take 1 tablet (20 mg total) by mouth at bedtime.   ferrous sulfate 325 (65 FE) MG tablet Commonly known as: FeroSul Take 1 tablet (325 mg total) by mouth daily with breakfast.   FLORASTOR PO Take 500 mg by mouth daily at 6 (six) AM.   fluticasone 50 MCG/ACT nasal spray Commonly known as: FLONASE Place 2 sprays into both nostrils daily.   furosemide 20 MG tablet Commonly known as: LASIX Take 20 mg by mouth daily as needed (fluid retention (feet swelling)).   Gemtesa 75 MG Tabs Generic drug: Vibegron Take 1 tablet (75 mg total) by mouth daily.   Gemtesa 75 MG Tabs Generic drug: Vibegron Take 1 tablet (75 mg total) by mouth daily.   HYDROcodone bit-homatropine 5-1.5 MG/5ML syrup Commonly known as: Hydromet Take 5 mLs by mouth every 8 (eight) hours as needed for cough.   hydrOXYzine 10 MG tablet Commonly known as: ATARAX TAKE ONE (1) TABLET BY MOUTH 3 TIMES DAILY AS NEEDED FOR ITCHING   hyoscyamine 0.125 MG SL tablet Commonly known as: LEVSIN SL Place 1 tablet (0.125 mg total) under the tongue every 4 (four) hours as needed.   lidocaine 5 % ointment Commonly known as: XYLOCAINE Apply 1 Application topically as needed.   lidocaine-prilocaine cream Commonly known as: EMLA Apply 1 Application topically as needed.   loperamide 2 MG capsule Commonly known as: IMODIUM Take 4 mg by mouth in the morning and at bedtime.   losartan 50 MG tablet Commonly known as: COZAAR Take 50 mg by mouth in the morning and at bedtime.   Magnesium Glycinate 100 MG Caps Take 200 mg by mouth at bedtime.   meclizine 12.5 MG tablet Commonly known as: ANTIVERT Take 12.5 mg by mouth 2 (two) times daily as needed for dizziness or nausea.   metoprolol succinate 50 MG 24 hr tablet Commonly known as: TOPROL-XL Take 1 tablet (50 mg total) by mouth 2 (two) times daily. What changed: when to take this    montelukast 10 MG tablet Commonly known as: Singulair Take 1 tablet (10 mg total) by mouth at bedtime. Please start taking this on September 4.   neomycin-polymyxin-hydrocortisone OTIC solution Commonly known as: CORTISPORIN Place 4 drops into both ears 4 (four) times daily.   nortriptyline 10 MG capsule Commonly known as: Pamelor Take 1 capsule (10 mg total) by mouth at bedtime.   omeprazole 20 MG capsule Commonly known as: PRILOSEC SMARTSIG:1.0 Capsule(s) By Mouth Daily   ondansetron 8 MG tablet Commonly known as: Zofran Take 1 tablet (8 mg total) by mouth every 8 (eight) hours as needed for nausea or vomiting.   oxyCODONE-acetaminophen 5-325  MG tablet Commonly known as: Percocet Take 2 tablets by mouth every 6 (six) hours as needed.   pantoprazole 40 MG tablet Commonly known as: PROTONIX Take 1 tablet (40 mg total) by mouth daily.   phenazopyridine 200 MG tablet Commonly known as: Pyridium Take 1 tablet (200 mg total) by mouth 3 (three) times daily as needed for pain.   Pillow Mask/Adult Misc   potassium chloride SA 20 MEQ tablet Commonly known as: KLOR-CON M TAKE 1 TABLET BY MOUTH EVERY MONDAY, WEDNESDAY, AND FRIDAY.   prochlorperazine 10 MG tablet Commonly known as: COMPAZINE Take 0.5 tablets (5 mg total) by mouth 2 (two) times daily as needed for nausea or vomiting.   scopolamine 1 MG/3DAYS Commonly known as: TRANSDERM-SCOP Place 1 patch (1.5 mg total) onto the skin every 3 (three) days.   sertraline 50 MG tablet Commonly known as: ZOLOFT Take 1 tablet (50 mg total) by mouth daily.   spironolactone 25 MG tablet Commonly known as: ALDACTONE Take 25 mg by mouth daily.   Vitamin D3 50 MCG (2000 UT) Tabs Take 2,000 Units by mouth in the morning.        Allergies:  Allergies  Allergen Reactions   Benazepril Anaphylaxis, Swelling and Hives    angioedema Throat and lip swelling   Ondansetron Hcl Hives    Redness and hives post IV admin on 07/05/17    Codeine Nausea And Vomiting   Morphine Hives    Redness and hives noted post IV admin on 07/05/17    Past Medical History, Surgical history, Social history, and Family History were reviewed and updated.  Review of Systems: All other 10 point review of systems is negative.   Physical Exam:  vitals were not taken for this visit.   Wt Readings from Last 3 Encounters:  02/16/24 189 lb 13.1 oz (86.1 kg)  02/07/24 190 lb (86.2 kg)  01/31/24 189 lb 6.4 oz (85.9 kg)    Ocular: Sclerae unicteric, pupils equal, round and reactive to light Ear-nose-throat: Oropharynx clear, dentition fair Lymphatic: No cervical or supraclavicular adenopathy Lungs no rales or rhonchi, good excursion bilaterally Heart regular rate and rhythm, no murmur appreciated Abd soft, nontender, positive bowel sounds MSK no focal spinal tenderness, no joint edema Neuro: non-focal, well-oriented, appropriate affect Breasts: Deferred   Lab Results  Component Value Date   WBC 4.2 02/18/2024   HGB 11.9 (L) 02/18/2024   HCT 35.8 (L) 02/18/2024   MCV 91.1 02/18/2024   PLT 137 (L) 02/18/2024   Lab Results  Component Value Date   FERRITIN 166 11/26/2023   IRON 76 11/26/2023   TIBC 385 11/26/2023   UIBC 309 11/26/2023   IRONPCTSAT 20 11/26/2023   Lab Results  Component Value Date   RETICCTPCT 1.2 11/27/2022   RBC 3.93 02/18/2024   RETICCTABS 32,640 07/16/2016   Lab Results  Component Value Date   KPAFRELGTCHN 5.1 01/21/2024   LAMBDASER 1.6 (L) 01/21/2024   KAPLAMBRATIO 3.19 (H) 01/21/2024   Lab Results  Component Value Date   IGGSERUM 882 01/21/2024   IGGSERUM 920 01/21/2024   IGA 29 (L) 01/21/2024   IGA 31 (L) 01/21/2024   IGMSERUM 22 (L) 01/21/2024   IGMSERUM 19 (L) 01/21/2024   Lab Results  Component Value Date   TOTALPROTELP 6.2 01/21/2024   ALBUMINELP 3.3 01/21/2024   A1GS 0.2 01/21/2024   A2GS 0.9 01/21/2024   BETS 1.0 01/21/2024   BETA2SER 0.5 07/16/2016   GAMS 0.8 01/21/2024    MSPIKE 0.4 (H)  01/21/2024   SPEI Comment 12/24/2023     Chemistry      Component Value Date/Time   NA 141 02/16/2024 1741   NA 143 11/08/2017 1127   NA 140 12/31/2016 1000   K 4.1 02/16/2024 1741   K 4.0 11/08/2017 1127   K 3.6 12/31/2016 1000   CL 109 02/16/2024 1741   CL 107 11/08/2017 1127   CO2 24 02/16/2024 1741   CO2 25 11/08/2017 1127   CO2 24 12/31/2016 1000   BUN 16 02/16/2024 1741   BUN 19 11/08/2017 1127   BUN 22.3 12/31/2016 1000   CREATININE 0.66 02/16/2024 1741   CREATININE 0.68 01/21/2024 0825   CREATININE 0.9 11/08/2017 1127   CREATININE 0.8 12/31/2016 1000      Component Value Date/Time   CALCIUM 9.1 02/16/2024 1741   CALCIUM 9.9 11/08/2017 1127   CALCIUM 9.5 12/31/2016 1000   ALKPHOS 45 02/16/2024 1741   ALKPHOS 44 11/08/2017 1127   ALKPHOS 51 12/31/2016 1000   AST 24 02/16/2024 1741   AST 17 01/21/2024 0825   AST 28 12/31/2016 1000   ALT 21 02/16/2024 1741   ALT 12 01/21/2024 0825   ALT 36 11/08/2017 1127   ALT 16 12/31/2016 1000   BILITOT 0.6 02/16/2024 1741   BILITOT 0.7 01/21/2024 0825   BILITOT 0.46 12/31/2016 1000       Impression and Plan: Ms. Askari is a very pleasant 68 yo African American female with a previous IgG kappa MGUS with progression to myeloma. Again, she has responded very nicely to treatment.  We will continue to hold treatment and give IV fluids today.  Protein studies have remained stable. Today's results pending.  Follow-up in 3 weeks.   Eileen Stanford, NP 4/11/20258:47 AM

## 2024-02-18 NOTE — Transitions of Care (Post Inpatient/ED Visit) (Signed)
   02/18/2024  Name: RACQUELLE HYSER MRN: [id number] DOB: [birth year]  Today's TOC FU Call Status: Today's TOC FU Call Status:: Unsuccessful Call (2nd Attempt) Unsuccessful Call (1st Attempt) Date: 02/17/24 Unsuccessful Call (2nd Attempt) Date: 02/18/24  Attempted to reach the patient regarding the most recent Inpatient/ED visit.  Follow Up Plan: No further outreach attempts will be made at this time. We have been unable to contact the patient.  Signature Agnes Lawrence, CMA (AAMA)  [facility] [phone]

## 2024-02-18 NOTE — Patient Instructions (Addendum)

## 2024-02-18 NOTE — Telephone Encounter (Signed)
 Noted.

## 2024-02-20 LAB — IGG, IGA, IGM
IgA: 34 mg/dL — ABNORMAL LOW (ref 87–352)
IgG (Immunoglobin G), Serum: 1008 mg/dL (ref 586–1602)
IgM (Immunoglobulin M), Srm: 23 mg/dL — ABNORMAL LOW (ref 26–217)

## 2024-02-21 LAB — KAPPA/LAMBDA LIGHT CHAINS
Kappa free light chain: 6 mg/L (ref 3.3–19.4)
Kappa, lambda light chain ratio: 3.33 — ABNORMAL HIGH (ref 0.26–1.65)
Lambda free light chains: 1.8 mg/L — ABNORMAL LOW (ref 5.7–26.3)

## 2024-02-22 ENCOUNTER — Telehealth: Payer: Self-pay

## 2024-02-22 LAB — CLOSTRIDIUM DIFFICILE CULTURE-FECAL

## 2024-02-22 NOTE — Telephone Encounter (Signed)
 Called patient. No answer. LVM

## 2024-02-22 NOTE — Telephone Encounter (Unsigned)
 Copied from CRM [id number]. Topic: Clinical - Red Word Triage >> Feb 22, 2024  9:57 AM Tami [name] wrote: [facility] that prompted transfer to Nurse Triage: Patient called regarding a stool sample she dropped off last week. She reports that she is still experiencing severe pain and has not heard back from the office. Patient is requesting an update. Callback Number: [phone]

## 2024-02-24 ENCOUNTER — Other Ambulatory Visit (HOSPITAL_COMMUNITY)
Admission: RE | Admit: 2024-02-24 | Discharge: 2024-02-24 | Disposition: A | Discharge status: Home / Self Care | Source: Ambulatory Visit | Attending: Physician Assistant | Admitting: Physician Assistant

## 2024-02-24 ENCOUNTER — Encounter: Payer: Self-pay | Admitting: Family Medicine

## 2024-02-24 ENCOUNTER — Encounter: Payer: Self-pay | Admitting: Physician Assistant

## 2024-02-24 ENCOUNTER — Ambulatory Visit (INDEPENDENT_AMBULATORY_CARE_PROVIDER_SITE_OTHER): Admitting: Physician Assistant

## 2024-02-24 VITALS — BP 103/72 | HR 99 | Ht 65.0 in | Wt 193.8 lb

## 2024-02-24 DIAGNOSIS — N898 Other specified noninflammatory disorders of vagina: Secondary | ICD-10-CM

## 2024-02-24 DIAGNOSIS — R8290 Unspecified abnormal findings in urine: Secondary | ICD-10-CM | POA: Diagnosis not present

## 2024-02-24 DIAGNOSIS — L299 Pruritus, unspecified: Secondary | ICD-10-CM | POA: Diagnosis not present

## 2024-02-24 DIAGNOSIS — R8271 Bacteriuria: Secondary | ICD-10-CM

## 2024-02-24 LAB — POCT URINALYSIS DIP (MANUAL ENTRY)
Bilirubin, UA: NEGATIVE
Blood, UA: NEGATIVE
Glucose, UA: NEGATIVE mg/dL
Ketones, POC UA: NEGATIVE mg/dL
Nitrite, UA: NEGATIVE
Protein Ur, POC: NEGATIVE mg/dL
Spec Grav, UA: 1.025 (ref 1.010–1.025)
Urobilinogen, UA: 0.2 U/dL
pH, UA: 5 (ref 5.0–8.0)

## 2024-02-24 LAB — PROTEIN ELECTROPHORESIS, SERUM, WITH REFLEX
A/G Ratio: 1.3 (ref 0.7–1.7)
Albumin ELP: 3.5 g/dL (ref 2.9–4.4)
Alpha-1-Globulin: 0.2 g/dL (ref 0.0–0.4)
Alpha-2-Globulin: 0.8 g/dL (ref 0.4–1.0)
Beta Globulin: 1 g/dL (ref 0.7–1.3)
Gamma Globulin: 0.7 g/dL (ref 0.4–1.8)
Globulin, Total: 2.8 g/dL (ref 2.2–3.9)
M-Spike, %: 0.3 g/dL — ABNORMAL HIGH
SPEP Interpretation: 0
Total Protein ELP: 6.3 g/dL (ref 6.0–8.5)

## 2024-02-24 LAB — IMMUNOFIXATION REFLEX, SERUM
IgA: 36 mg/dL — ABNORMAL LOW (ref 87–352)
IgG (Immunoglobin G), Serum: 943 mg/dL (ref 586–1602)
IgM (Immunoglobulin M), Srm: 21 mg/dL — ABNORMAL LOW (ref 26–217)

## 2024-02-24 MED ORDER — FLUCONAZOLE 150 MG PO TABS: 150.0000 mg | ORAL_TABLET | Freq: Once | ORAL | 0 refills | Status: AC

## 2024-02-24 MED ORDER — CEPHALEXIN 500 MG PO CAPS: 500.0000 mg | ORAL_CAPSULE | Freq: Two times a day (BID) | ORAL | 0 refills | Status: AC

## 2024-02-24 MED ORDER — PREDNISONE 20 MG PO TABS: 20.0000 mg | ORAL_TABLET | Freq: Every day | ORAL | 0 refills | Status: DC

## 2024-02-24 NOTE — Progress Notes (Signed)
 Established patient visit   Patient: Tami [name]   DOB: 20-Dec-1955   68 y.o. Female  MRN: [id number] Visit Date: 02/24/2024  Today's healthcare provider: Alfredia Ferguson, PA-C   Cc. Urinary odor, vaginal itching, itching  Subjective     Pt reports urinary odor, vaginal itching for the last few days. Given the long weekend, she wants to be sure she does not have a yeast or bladder infection.  She also reports being outside in the yard all day and now has an itchy rash all over her body. Reports she takes zyrtec regularly and this does not help.   Medications: Outpatient Medications Prior to Visit  Medication Sig   acetaminophen (TYLENOL) 500 MG tablet Take 500 mg by mouth every 6 (six) hours as needed.   acyclovir (ZOVIRAX) 400 MG tablet TAKE ONE (1) TABLET BY MOUTH TWO (2) TIMES DAILY   albuterol (PROVENTIL) (2.5 MG/3ML) 0.083% nebulizer solution Take 3 mLs (2.5 mg total) by nebulization every 6 (six) hours as needed for wheezing or shortness of breath.   albuterol (VENTOLIN HFA) 108 (90 Base) MCG/ACT inhaler Inhale 2 puffs into the lungs every 6 (six) hours as needed for wheezing or shortness of breath.   ALPRAZolam (XANAX) 0.25 MG tablet Take 1-2 tablets (0.25-0.5 mg total) by mouth 3 (three) times daily as needed for anxiety.   amLODipine (NORVASC) 5 MG tablet Take 5 mg by mouth daily.   aspirin EC 81 MG tablet Take 81 mg by mouth daily. Swallow whole.   budesonide (PULMICORT) 0.5 MG/2ML nebulizer solution Take 2 mLs (0.5 mg total) by nebulization 2 (two) times daily as needed (cough, wheezing or shortness of breath).   celecoxib (CELEBREX) 100 MG capsule Take 100 mg by mouth 2 (two) times daily.   cetirizine (ZYRTEC) 10 MG tablet Take 10 mg by mouth at bedtime.   Cholecalciferol (VITAMIN D3) 50 MCG (2000 UT) TABS Take 2,000 Units by mouth in the morning.   cholestyramine light (PREVALITE) 4 g packet Take 4 g by mouth 2 (two) times daily.   cyclobenzaprine (FLEXERIL) 5  MG tablet Take 1 tablet (5 mg total) by mouth 2 (two) times daily as needed for up to 10 doses for muscle spasms.   dicyclomine (BENTYL) 10 MG capsule Take 1 capsule (10 mg total) by mouth 4 (four) times daily -  before meals and at bedtime. cramping   diphenoxylate-atropine (LOMOTIL) 2.5-0.025 MG tablet Take 1 tablet by mouth 2 (two) times daily.   estradiol (ESTRACE) 0.1 MG/GM vaginal cream Place 0.5 g vaginally 2 (two) times a week. Place 0.5g nightly for two weeks then twice a week after   famotidine (PEPCID) 20 MG tablet Take 1 tablet (20 mg total) by mouth at bedtime.   ferrous sulfate (FEROSUL) 325 (65 FE) MG tablet Take 1 tablet (325 mg total) by mouth daily with breakfast.   fluticasone (FLONASE) 50 MCG/ACT nasal spray Place 2 sprays into both nostrils daily.   furosemide (LASIX) 20 MG tablet Take 20 mg by mouth daily as needed (fluid retention (feet swelling)).   HYDROcodone bit-homatropine (HYDROMET) 5-1.5 MG/5ML syrup Take 5 mLs by mouth every 8 (eight) hours as needed for cough.   hydrOXYzine (ATARAX) 10 MG tablet TAKE ONE (1) TABLET BY MOUTH 3 TIMES DAILY AS NEEDED FOR ITCHING   hyoscyamine (LEVSIN SL) 0.125 MG SL tablet Place 1 tablet (0.125 mg total) under the tongue every 4 (four) hours as needed.   lidocaine (XYLOCAINE) 5 %  ointment Apply 1 Application topically as needed.   lidocaine-prilocaine (EMLA) cream Apply 1 Application topically as needed.   loperamide (IMODIUM) 2 MG capsule Take 4 mg by mouth in the morning and at bedtime.   losartan (COZAAR) 50 MG tablet Take 50 mg by mouth in the morning and at bedtime.   Magnesium Glycinate 100 MG CAPS Take 200 mg by mouth at bedtime.   meclizine (ANTIVERT) 12.5 MG tablet Take 12.5 mg by mouth 2 (two) times daily as needed for dizziness or nausea.   metoprolol succinate (TOPROL-XL) 50 MG 24 hr tablet Take 1 tablet (50 mg total) by mouth 2 (two) times daily. (Patient taking differently: Take 50 mg by mouth daily.)   montelukast  (SINGULAIR) 10 MG tablet Take 1 tablet (10 mg total) by mouth at bedtime. Please start taking this on September 4.   Multiple Vitamins-Minerals (CENTRUM SILVER WOMEN 50+ PO) Take 1 tablet by mouth daily at 6 (six) AM.   neomycin-polymyxin-hydrocortisone (CORTISPORIN) OTIC solution Place 4 drops into both ears 4 (four) times daily.   nortriptyline (PAMELOR) 10 MG capsule Take 1 capsule (10 mg total) by mouth at bedtime.   omeprazole (PRILOSEC) 20 MG capsule SMARTSIG:1.0 Capsule(s) By Mouth Daily   ondansetron (ZOFRAN) 8 MG tablet Take 1 tablet (8 mg total) by mouth every 8 (eight) hours as needed for nausea or vomiting.   oxyCODONE-acetaminophen (PERCOCET) 5-325 MG tablet Take 2 tablets by mouth every 6 (six) hours as needed.   pantoprazole (PROTONIX) 40 MG tablet Take 1 tablet (40 mg total) by mouth daily.   phenazopyridine (PYRIDIUM) 200 MG tablet Take 1 tablet (200 mg total) by mouth 3 (three) times daily as needed for pain.   potassium chloride SA (KLOR-CON M) 20 MEQ tablet TAKE 1 TABLET BY MOUTH EVERY MONDAY, WEDNESDAY, AND FRIDAY.   prochlorperazine (COMPAZINE) 10 MG tablet Take 0.5 tablets (5 mg total) by mouth 2 (two) times daily as needed for nausea or vomiting.   Respiratory Therapy Supplies (PILLOW MASK/ADULT) MISC    Saccharomyces boulardii (FLORASTOR PO) Take 500 mg by mouth daily at 6 (six) AM.   scopolamine (TRANSDERM-SCOP) 1 MG/3DAYS Place 1 patch (1.5 mg total) onto the skin every 3 (three) days.   sertraline (ZOLOFT) 50 MG tablet Take 1 tablet (50 mg total) by mouth daily.   spironolactone (ALDACTONE) 25 MG tablet Take 25 mg by mouth daily.   Vibegron (GEMTESA) 75 MG TABS Take 1 tablet (75 mg total) by mouth daily.   Vibegron (GEMTESA) 75 MG TABS Take 1 tablet (75 mg total) by mouth daily.   Facility-Administered Medications Prior to Visit  Medication Dose Route Frequency Provider   acetaminophen (TYLENOL) tablet 650 mg  650 mg Oral Once Ennever, Peter R, MD   nitroGLYCERIN  (NITROSTAT) SL tablet 0.4 mg  0.4 mg Sublingual Once Blyth, Stacey A, MD   sodium chloride flush (NS) 0.9 % injection 10 mL  10 mL Intracatheter PRN Ivor Mars, MD    Review of Systems  Constitutional:  Negative for fatigue and fever.  Respiratory:  Negative for cough and shortness of breath.   Cardiovascular:  Negative for chest pain and leg swelling.  Gastrointestinal:  Negative for abdominal pain.  Genitourinary:        Urinary odor, vaginal itching  Neurological:  Negative for dizziness and headaches.       Objective    BP 103/72   Pulse 99   Ht 5\' 5"  (1.651 m)   Wt 193 lb  12.8 oz (87.9 kg)   BMI 32.25 kg/m    Physical Exam Vitals reviewed.  Constitutional:      Appearance: She is not ill-appearing.  HENT:     Head: Normocephalic.  Eyes:     Conjunctiva/sclera: Conjunctivae normal.  Cardiovascular:     Rate and Rhythm: Normal rate.  Pulmonary:     Effort: Pulmonary effort is normal. No respiratory distress.  Skin:    Findings: No rash.  Neurological:     Mental Status: She is alert and oriented to person, place, and time.  Psychiatric:        Mood and Affect: Mood normal.        Behavior: Behavior normal.     Results for orders placed or performed in visit on 02/24/24  POCT urinalysis dipstick  Result Value Ref Range   Color, UA yellow yellow   Clarity, UA clear clear   Glucose, UA negative negative mg/dL   Bilirubin, UA negative negative   Ketones, POC UA negative negative mg/dL   Spec Grav, UA 4.098 1.191 - 1.025   Blood, UA negative negative   pH, UA 5.0 5.0 - 8.0   Protein Ur, POC negative negative mg/dL   Urobilinogen, UA 0.2 0.2 or 1.0 E.U./dL   Nitrite, UA Negative Negative   Leukocytes, UA Small (1+) (A) Negative    Assessment & Plan    Vaginal itching -     POCT urinalysis dipstick -     Cervicovaginal ancillary only -     Urine Culture -     Fluconazole; Take 1 tablet (150 mg total) by mouth once for 1 dose.  Dispense: 1 tablet;  Refill: 0  Itching -     predniSONE; Take 1 tablet (20 mg total) by mouth daily with breakfast.  Dispense: 5 tablet; Refill: 0  Bacteriuria -     Cephalexin; Take 1 capsule (500 mg total) by mouth 2 (two) times daily for 5 days.  Dispense: 10 capsule; Refill: 0   Advised pt UA only shows small leuks, to hold off on taking keflex unless symptoms worsen over the weekend. Ordered culture.   Return if symptoms worsen or fail to improve.       Trenton Frock, PA-C  [facility] [phone] (phone) [phone] (fax)  [facility]

## 2024-02-25 LAB — CERVICOVAGINAL ANCILLARY ONLY
Bacterial Vaginitis (gardnerella): POSITIVE — AB
Candida Glabrata: NEGATIVE
Candida Vaginitis: NEGATIVE
Chlamydia: NEGATIVE
Comment: NEGATIVE
Comment: NEGATIVE
Comment: NEGATIVE
Comment: NEGATIVE
Comment: NEGATIVE
Comment: NORMAL
Neisseria Gonorrhea: NEGATIVE
Trichomonas: NEGATIVE

## 2024-02-25 LAB — URINE CULTURE
MICRO NUMBER:: 16343037
SPECIMEN QUALITY:: ADEQUATE

## 2024-02-28 ENCOUNTER — Other Ambulatory Visit: Payer: Self-pay | Admitting: Physician Assistant

## 2024-02-28 ENCOUNTER — Encounter: Payer: Self-pay | Admitting: Physician Assistant

## 2024-02-28 ENCOUNTER — Encounter: Payer: Self-pay | Admitting: Emergency Medicine

## 2024-02-28 DIAGNOSIS — B9689 Other specified bacterial agents as the cause of diseases classified elsewhere: Secondary | ICD-10-CM

## 2024-02-28 MED ORDER — METRONIDAZOLE 0.75 % EX GEL: CUTANEOUS | 0 refills | Status: DC

## 2024-02-29 ENCOUNTER — Telehealth: Payer: Self-pay

## 2024-02-29 NOTE — Telephone Encounter (Signed)
 Copied from CRM [id number]. Topic: Clinical - Medical Advice >> Feb 29, 2024  4:26 PM Tami [name] wrote: Reason for CRM: Patient has had some lingering stomach issues and would like to discuss next steps about her getting an OB. Patient stated this was previously discussed with PCP, but questioning if she would need an OV to discuss. Patient did deny nurse triage for the stomach issues that she is currently experiencing.    Returned patient's call and gave number to [facility] for [facility] which is listed in referral from 12/29/2023.

## 2024-03-02 ENCOUNTER — Emergency Department (HOSPITAL_BASED_OUTPATIENT_CLINIC_OR_DEPARTMENT_OTHER)

## 2024-03-02 ENCOUNTER — Other Ambulatory Visit: Payer: Self-pay

## 2024-03-02 ENCOUNTER — Encounter (HOSPITAL_BASED_OUTPATIENT_CLINIC_OR_DEPARTMENT_OTHER): Payer: Self-pay | Admitting: Emergency Medicine

## 2024-03-02 ENCOUNTER — Emergency Department (HOSPITAL_BASED_OUTPATIENT_CLINIC_OR_DEPARTMENT_OTHER)
Admission: EM | Admit: 2024-03-02 | Discharge: 2024-03-02 | Disposition: A | Discharge status: Home / Self Care | Attending: Emergency Medicine | Admitting: Emergency Medicine

## 2024-03-02 DIAGNOSIS — R531 Weakness: Secondary | ICD-10-CM | POA: Diagnosis not present

## 2024-03-02 DIAGNOSIS — R109 Unspecified abdominal pain: Secondary | ICD-10-CM | POA: Diagnosis not present

## 2024-03-02 DIAGNOSIS — Z8673 Personal history of transient ischemic attack (TIA), and cerebral infarction without residual deficits: Secondary | ICD-10-CM | POA: Diagnosis not present

## 2024-03-02 DIAGNOSIS — I110 Hypertensive heart disease with heart failure: Secondary | ICD-10-CM | POA: Diagnosis not present

## 2024-03-02 DIAGNOSIS — N3000 Acute cystitis without hematuria: Secondary | ICD-10-CM | POA: Diagnosis not present

## 2024-03-02 DIAGNOSIS — Z7982 Long term (current) use of aspirin: Secondary | ICD-10-CM | POA: Diagnosis not present

## 2024-03-02 DIAGNOSIS — Z79899 Other long term (current) drug therapy: Secondary | ICD-10-CM | POA: Insufficient documentation

## 2024-03-02 DIAGNOSIS — I509 Heart failure, unspecified: Secondary | ICD-10-CM | POA: Diagnosis not present

## 2024-03-02 DIAGNOSIS — Z452 Encounter for adjustment and management of vascular access device: Secondary | ICD-10-CM | POA: Diagnosis not present

## 2024-03-02 LAB — COMPREHENSIVE METABOLIC PANEL WITH GFR
ALT: 17 U/L (ref 0–44)
AST: 24 U/L (ref 15–41)
Albumin: 4.2 g/dL (ref 3.5–5.0)
Alkaline Phosphatase: 52 U/L (ref 38–126)
Anion gap: 14 (ref 5–15)
BUN: 19 mg/dL (ref 8–23)
CO2: 20 mmol/L — ABNORMAL LOW (ref 22–32)
Calcium: 9.9 mg/dL (ref 8.9–10.3)
Chloride: 107 mmol/L (ref 98–111)
Creatinine, Ser: 0.65 mg/dL (ref 0.44–1.00)
GFR, Estimated: >60 mL/min (ref >60)
Glucose, Bld: 128 mg/dL — ABNORMAL HIGH (ref 70–99)
Potassium: 4 mmol/L (ref 3.5–5.1)
Sodium: 141 mmol/L (ref 135–145)
Total Bilirubin: 0.3 mg/dL (ref 0.0–1.2)
Total Protein: 7 g/dL (ref 6.5–8.1)

## 2024-03-02 LAB — CBC
HCT: 36.5 % (ref 36.0–46.0)
Hemoglobin: 12.2 g/dL (ref 12.0–15.0)
MCH: 30.4 pg (ref 26.0–34.0)
MCHC: 33.4 g/dL (ref 30.0–36.0)
MCV: 91 fL (ref 80.0–100.0)
Platelets: 154 10*3/uL (ref 150–400)
RBC: 4.01 MIL/uL (ref 3.87–5.11)
RDW: 12.7 % (ref 11.5–15.5)
WBC: 3.5 10*3/uL — ABNORMAL LOW (ref 4.0–10.5)
nRBC: 0 % (ref 0.0–0.2)

## 2024-03-02 LAB — URINALYSIS, ROUTINE W REFLEX MICROSCOPIC
Bilirubin Urine: NEGATIVE
Glucose, UA: NEGATIVE mg/dL
Hgb urine dipstick: NEGATIVE
Ketones, ur: NEGATIVE mg/dL
Nitrite: NEGATIVE
Protein, ur: NEGATIVE mg/dL
Specific Gravity, Urine: >=1.03 (ref 1.005–1.030)
pH: 5.5 (ref 5.0–8.0)

## 2024-03-02 LAB — URINALYSIS, MICROSCOPIC (REFLEX): RBC / HPF: NONE SEEN RBC/hpf (ref 0–5)

## 2024-03-02 LAB — LIPASE, BLOOD: Lipase: 43 U/L (ref 11–51)

## 2024-03-02 MED ORDER — SODIUM CHLORIDE 0.9 % IV BOLUS
1000.0000 mL | Freq: Once | INTRAVENOUS | Status: AC
  Administered 2024-03-02: 1000 mL via INTRAVENOUS

## 2024-03-02 MED ORDER — FOSFOMYCIN TROMETHAMINE 3 G PO PACK
3.0000 g | PACK | Freq: Once | ORAL | Status: AC
  Administered 2024-03-02: 3 g via ORAL
  Filled 2024-03-02: qty 3

## 2024-03-02 MED ORDER — FOSFOMYCIN TROMETHAMINE 3 G PO PACK: 3.0000 g | PACK | Freq: Once | ORAL | Status: DC

## 2024-03-02 NOTE — ED Triage Notes (Signed)
 Pt POV c/o being sick since Saturday, emesis, diarrhea, diaphoresis. Feeling generalized weakness.   Good po intake.

## 2024-03-02 NOTE — ED Provider Notes (Signed)
 [place] [facility] AT [facility] Provider Note   CSN: [id number] Arrival date & time: 03/02/24  1329     History  Chief Complaint  Patient presents with   Weakness    Tami [name] is a 68 y.o. female.  Is here with nausea vomiting diarrhea.  Mostly having some diarrhea.  No recent antibiotics.  History of CHF reflux high cholesterol hypertension stroke.  She has multiple myeloma.  She has not had chemotherapy for several weeks now.  She just feels dehydrated wants IV fluids.  She denies any fever chills chest pain.  She is having some abdominal discomfort at times.  Denies any syncope.  The history is provided by the patient.       Home Medications Prior to Admission medications   Medication Sig Start Date End Date Taking? Authorizing Provider  acetaminophen  (TYLENOL ) 500 MG tablet Take 500 mg by mouth every 6 (six) hours as needed.    [provider]  acyclovir  (ZOVIRAX ) 400 MG tablet TAKE ONE (1) TABLET BY MOUTH TWO (2) TIMES DAILY 02/08/24   Ivor Mars, MD  albuterol  (PROVENTIL ) (2.5 MG/3ML) 0.083% nebulizer solution Take 3 mLs (2.5 mg total) by nebulization every 6 (six) hours as needed for wheezing or shortness of breath. 12/14/23   Wilfredo Hanly, MD  albuterol  (VENTOLIN  HFA) 108 (90 Base) MCG/ACT inhaler Inhale 2 puffs into the lungs every 6 (six) hours as needed for wheezing or shortness of breath. 12/14/23   Wilfredo Hanly, MD  ALPRAZolam  (XANAX ) 0.25 MG tablet Take 1-2 tablets (0.25-0.5 mg total) by mouth 3 (three) times daily as needed for anxiety. 06/14/23   Neda Balk, MD  amLODipine  (NORVASC ) 5 MG tablet Take 5 mg by mouth daily. 09/14/23   [provider]  aspirin  EC 81 MG tablet Take 81 mg by mouth daily. Swallow whole.    [provider]  budesonide  (PULMICORT ) 0.5 MG/2ML nebulizer solution Take 2 mLs (0.5 mg total) by nebulization 2 (two) times daily as needed (cough, wheezing or shortness of breath).  12/14/23   Wilfredo Hanly, MD  celecoxib  (CELEBREX ) 100 MG capsule Take 100 mg by mouth 2 (two) times daily. 06/07/23   [provider]  cetirizine  (ZYRTEC ) 10 MG tablet Take 10 mg by mouth at bedtime.    [provider]  Cholecalciferol (VITAMIN D3) 50 MCG (2000 UT) TABS Take 2,000 Units by mouth in the morning.    [provider]  cholestyramine  light (PREVALITE ) 4 g packet Take 4 g by mouth 2 (two) times daily.    [provider]  cyclobenzaprine  (FLEXERIL ) 5 MG tablet Take 1 tablet (5 mg total) by mouth 2 (two) times daily as needed for up to 10 doses for muscle spasms. 01/03/24   Shadae Reino, DO  dicyclomine  (BENTYL ) 10 MG capsule Take 1 capsule (10 mg total) by mouth 4 (four) times daily -  before meals and at bedtime. cramping 12/02/23 03/01/24  Neda Balk, MD  diphenoxylate -atropine  (LOMOTIL ) 2.5-0.025 MG tablet Take 1 tablet by mouth 2 (two) times daily. 01/24/24   Neda Balk, MD  estradiol  (ESTRACE ) 0.1 MG/GM vaginal cream Place 0.5 g vaginally 2 (two) times a week. Place 0.5g nightly for two weeks then twice a week after 02/03/24   Wyonia Hefty T, MD  famotidine  (PEPCID ) 20 MG tablet Take 1 tablet (20 mg total) by mouth at bedtime. 09/16/23   Neda Balk, MD  ferrous sulfate (FEROSUL) 325 (  65 FE) MG tablet Take 1 tablet (325 mg total) by mouth daily with breakfast. 01/19/24   Neda Balk, MD  fluticasone  (FLONASE ) 50 MCG/ACT nasal spray Place 2 sprays into both nostrils daily. 01/07/20   Joy, Shawn C, PA-C  furosemide  (LASIX ) 20 MG tablet Take 20 mg by mouth daily as needed (fluid retention (feet swelling)). 07/03/20   [provider]  HYDROcodone  bit-homatropine (HYDROMET) 5-1.5 MG/5ML syrup Take 5 mLs by mouth every 8 (eight) hours as needed for cough. 09/20/23   Neda Balk, MD  hydrOXYzine  (ATARAX ) 10 MG tablet TAKE ONE (1) TABLET BY MOUTH 3 TIMES DAILY AS NEEDED FOR ITCHING 03/03/23   Neda Balk, MD  hyoscyamine  (LEVSIN SL)  0.125 MG SL tablet Place 1 tablet (0.125 mg total) under the tongue every 4 (four) hours as needed. 01/24/24   Neda Balk, MD  lidocaine  (XYLOCAINE ) 5 % ointment Apply 1 Application topically as needed. 01/31/24   Darlene Ehlers, MD  lidocaine -prilocaine  (EMLA ) cream Apply 1 Application topically as needed. 07/01/23   Ivor Mars, MD  loperamide  (IMODIUM ) 2 MG capsule Take 4 mg by mouth in the morning and at bedtime.    [provider]  losartan  (COZAAR ) 50 MG tablet Take 50 mg by mouth in the morning and at bedtime. 04/14/21   [provider]  Magnesium  Glycinate 100 MG CAPS Take 200 mg by mouth at bedtime. 09/21/22   [provider]  meclizine  (ANTIVERT ) 12.5 MG tablet Take 12.5 mg by mouth 2 (two) times daily as needed for dizziness or nausea. 01/28/22   [provider]  metoprolol  succinate (TOPROL -XL) 50 MG 24 hr tablet Take 1 tablet (50 mg total) by mouth 2 (two) times daily. Patient taking differently: Take 50 mg by mouth daily. 02/23/23   Neda Balk, MD  metroNIDAZOLE  (METROGEL ) 0.75 % gel Insert One applicatorful (5 g ) vaginally at bedtime for 5 days 02/28/24   Drubel, Heidi Llamas, PA-C  montelukast  (SINGULAIR ) 10 MG tablet Take 1 tablet (10 mg total) by mouth at bedtime. Please start taking this on September 4. 07/01/23   Ivor Mars, MD  Multiple Vitamins-Minerals (CENTRUM SILVER  WOMEN 50+ PO) Take 1 tablet by mouth daily at 6 (six) AM.    [provider]  neomycin -polymyxin-hydrocortisone (CORTISPORIN) OTIC solution Place 4 drops into both ears 4 (four) times daily. 07/30/23   Ivor Mars, MD  nortriptyline  (PAMELOR ) 10 MG capsule Take 1 capsule (10 mg total) by mouth at bedtime. 06/14/23   Neda Balk, MD  omeprazole  (PRILOSEC) 20 MG capsule SMARTSIG:1.0 Capsule(s) By Mouth Daily 08/19/23   [provider]  ondansetron  (ZOFRAN ) 8 MG tablet Take 1 tablet (8 mg total) by mouth every 8 (eight) hours as needed for nausea or  vomiting. 07/14/23   Ivor Mars, MD  oxyCODONE -acetaminophen  (PERCOCET) 5-325 MG tablet Take 2 tablets by mouth every 6 (six) hours as needed. 08/07/23   Dalene Duck, MD  pantoprazole  (PROTONIX ) 40 MG tablet Take 1 tablet (40 mg total) by mouth daily. 09/03/23   O'Sullivan, Melissa, NP  phenazopyridine  (PYRIDIUM ) 200 MG tablet Take 1 tablet (200 mg total) by mouth 3 (three) times daily as needed for pain. 01/31/24   Darlene Ehlers, MD  potassium chloride  SA (KLOR-CON  M) 20 MEQ tablet TAKE 1 TABLET BY MOUTH EVERY MONDAY, WEDNESDAY, AND FRIDAY. 10/25/23   Neda Balk, MD  predniSONE  (DELTASONE ) 20 MG tablet Take 1 tablet (20 mg total)  by mouth daily with breakfast. 02/24/24   Trenton Frock, PA-C  prochlorperazine  (COMPAZINE ) 10 MG tablet Take 0.5 tablets (5 mg total) by mouth 2 (two) times daily as needed for nausea or vomiting. 12/02/23   Neda Balk, MD  Respiratory Therapy Supplies (PILLOW MASK/ADULT) MISC  09/29/23   [provider]  Saccharomyces boulardii (FLORASTOR PO) Take 500 mg by mouth daily at 6 (six) AM.    [provider]  scopolamine  (TRANSDERM-SCOP) 1 MG/3DAYS Place 1 patch (1.5 mg total) onto the skin every 3 (three) days. 07/01/23   Ivor Mars, MD  sertraline  (ZOLOFT ) 50 MG tablet Take 1 tablet (50 mg total) by mouth daily. 06/14/23   Neda Balk, MD  spironolactone  (ALDACTONE ) 25 MG tablet Take 25 mg by mouth daily.    [provider]  Vibegron  (GEMTESA ) 75 MG TABS Take 1 tablet (75 mg total) by mouth daily. 01/31/24   Darlene Ehlers, MD  Vibegron  (GEMTESA ) 75 MG TABS Take 1 tablet (75 mg total) by mouth daily. 01/31/24   Darlene Ehlers, MD      Allergies    Benazepril , Ondansetron  hcl, Codeine, and Morphine    Review of Systems   Review of Systems  Physical Exam Updated Vital Signs BP 133/88   Pulse 81   Temp 98.3 F (36.8 C) (Oral)   Resp 18   Ht 5\' 5"  (1.651 m)   Wt 87.9 kg   SpO2 96%   BMI 32.25 kg/m  Physical Exam Vitals  and nursing note reviewed.  Constitutional:      General: She is not in acute distress.    Appearance: She is well-developed. She is not ill-appearing.  HENT:     Head: Normocephalic and atraumatic.     Nose: Nose normal.     Mouth/Throat:     Mouth: Mucous membranes are moist.  Eyes:     Extraocular Movements: Extraocular movements intact.     Conjunctiva/sclera: Conjunctivae normal.     Pupils: Pupils are equal, round, and reactive to light.  Cardiovascular:     Rate and Rhythm: Normal rate and regular rhythm.     Pulses: Normal pulses.     Heart sounds: Normal heart sounds. No murmur heard. Pulmonary:     Effort: Pulmonary effort is normal. No respiratory distress.     Breath sounds: Normal breath sounds.  Abdominal:     Palpations: Abdomen is soft.     Tenderness: There is abdominal tenderness.  Musculoskeletal:        General: No swelling.     Cervical back: Normal range of motion and neck supple.  Skin:    General: Skin is warm and dry.     Capillary Refill: Capillary refill takes less than 2 seconds.  Neurological:     General: No focal deficit present.     Mental Status: She is alert and oriented to person, place, and time.     Cranial Nerves: No cranial nerve deficit.     Sensory: No sensory deficit.     Motor: No weakness.     Coordination: Coordination normal.  Psychiatric:        Mood and Affect: Mood normal.     ED Results / Procedures / Treatments   Labs (all labs ordered are listed, but only abnormal results are displayed) Labs Reviewed  COMPREHENSIVE METABOLIC PANEL WITH GFR - Abnormal; Notable for the following components:      Result Value   CO2 20 (*)  Glucose, Bld 128 (*)    All other components within normal limits  CBC - Abnormal; Notable for the following components:   WBC 3.5 (*)    All other components within normal limits  URINALYSIS, ROUTINE W REFLEX MICROSCOPIC - Abnormal; Notable for the following components:   APPearance HAZY (*)     Leukocytes,Ua MODERATE (*)    All other components within normal limits  URINALYSIS, MICROSCOPIC (REFLEX) - Abnormal; Notable for the following components:   Bacteria, UA FEW (*)    All other components within normal limits  LIPASE, BLOOD    EKG EKG Interpretation Date/Time:  Thursday March 02 2024 13:40:35 EDT Ventricular Rate:  83 PR Interval:  156 QRS Duration:  153 QT Interval:  434 QTC Calculation: 510 R Axis:   -65  Text Interpretation: Sinus rhythm Probable left atrial enlargement Confirmed by Lowery Rue [phone]) on 03/02/2024 3:39:40 PM  Radiology DG Chest 2 View Result Date: 03/02/2024 CLINICAL DATA:  Weakness EXAM: CHEST - 2 VIEW COMPARISON:  November 29, 2023 FINDINGS: 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 Electronically Signed   By: Fredrich Jefferson M.D.   On: 03/02/2024 15:33    Procedures Procedures    Medications Ordered in ED Medications  fosfomycin (MONUROL ) packet 3 g (has no administration in time range)  sodium chloride  0.9 % bolus 1,000 mL (0 mLs Intravenous Stopped 03/02/24 1800)    ED Course/ Medical Decision Making/ A&P                                 Medical Decision Making Amount and/or Complexity of Data Reviewed Labs: ordered. Radiology: ordered.  Risk Prescription drug management.   Tami [name] is here with generalized weakness nausea vomit diarrhea.  Mostly now with diarrhea.  Feels dehydrated.  History of multiple myeloma, CHF bowel obstruction history.  Overall differential diagnosis likely viral GI process foodborne illness.  Will collect stool sample if she is able to give us  a sample.  Vital signs are reassuring.  Will give IV fluids check basic labs get a CT scan abdomen pelvis.  Seems less  likely to be bowel obstruction.  Could be a colitis.  She has no significant anemia leukocytosis or electrolyte abnormality.  Kidney functions normal.  Chest x-ray is unremarkable.  EKG shows sinus rhythm.  No ischemic changes.  Not any pulmonary cardiology process.  Awaiting urinalysis and CT scan abdomen pelvis.  Will treat for possible UTI with fosfomycin.  Patient wanted to leave prior to CT report resulting.  She understood the risks and benefits.  I have low suspicion that they will be something acute on the CT scan but I will follow-up reach out to her if needed.  Will have her have her primary doctor review the scan for any incidental findings.  This chart was dictated using voice recognition software.  Despite best efforts to proofread,  errors can occur which can change the documentation meaning.         Final Clinical Impression(s) / ED Diagnoses Final diagnoses:  Weakness  Acute cystitis without hematuria    Rx / DC Orders ED Discharge Orders     None         Lowery Rue,  DO 03/02/24 1912

## 2024-03-03 ENCOUNTER — Inpatient Hospital Stay: Admitting: Family

## 2024-03-03 ENCOUNTER — Inpatient Hospital Stay

## 2024-03-03 VITALS — BP 146/68 | HR 73 | Resp 18

## 2024-03-03 VITALS — BP 150/85 | HR 89 | Temp 97.8°F | Resp 18 | Ht 65.0 in | Wt 192.0 lb

## 2024-03-03 DIAGNOSIS — C9000 Multiple myeloma not having achieved remission: Secondary | ICD-10-CM

## 2024-03-03 DIAGNOSIS — D801 Nonfamilial hypogammaglobulinemia: Secondary | ICD-10-CM

## 2024-03-03 DIAGNOSIS — D500 Iron deficiency anemia secondary to blood loss (chronic): Secondary | ICD-10-CM

## 2024-03-03 DIAGNOSIS — E860 Dehydration: Secondary | ICD-10-CM

## 2024-03-03 DIAGNOSIS — C9002 Multiple myeloma in relapse: Secondary | ICD-10-CM

## 2024-03-03 LAB — CBC WITH DIFFERENTIAL (CANCER CENTER ONLY)
Abs Immature Granulocytes: 0.02 10*3/uL (ref 0.00–0.07)
Basophils Absolute: 0 10*3/uL (ref 0.0–0.1)
Basophils Relative: 1 %
Eosinophils Absolute: 0.1 10*3/uL (ref 0.0–0.5)
Eosinophils Relative: 1 %
HCT: 36.7 % (ref 36.0–46.0)
Hemoglobin: 11.9 g/dL — ABNORMAL LOW (ref 12.0–15.0)
Immature Granulocytes: 0 %
Lymphocytes Relative: 16 %
Lymphs Abs: 0.8 10*3/uL (ref 0.7–4.0)
MCH: 29.8 pg (ref 26.0–34.0)
MCHC: 32.4 g/dL (ref 30.0–36.0)
MCV: 91.8 fL (ref 80.0–100.0)
Monocytes Absolute: 0.3 10*3/uL (ref 0.1–1.0)
Monocytes Relative: 7 %
Neutro Abs: 3.7 10*3/uL (ref 1.7–7.7)
Neutrophils Relative %: 75 %
Platelet Count: 158 10*3/uL (ref 150–400)
RBC: 4 MIL/uL (ref 3.87–5.11)
RDW: 12.7 % (ref 11.5–15.5)
WBC Count: 4.9 10*3/uL (ref 4.0–10.5)
nRBC: 0 % (ref 0.0–0.2)

## 2024-03-03 LAB — CMP (CANCER CENTER ONLY)
ALT: 14 U/L (ref 0–44)
AST: 17 U/L (ref 15–41)
Albumin: 4.1 g/dL (ref 3.5–5.0)
Alkaline Phosphatase: 49 U/L (ref 38–126)
Anion gap: 9 (ref 5–15)
BUN: 15 mg/dL (ref 8–23)
CO2: 25 mmol/L (ref 22–32)
Calcium: 9.4 mg/dL (ref 8.9–10.3)
Chloride: 108 mmol/L (ref 98–111)
Creatinine: 0.79 mg/dL (ref 0.44–1.00)
GFR, Estimated: >60 mL/min (ref >60)
Glucose, Bld: 118 mg/dL — ABNORMAL HIGH (ref 70–99)
Potassium: 3.9 mmol/L (ref 3.5–5.1)
Sodium: 142 mmol/L (ref 135–145)
Total Bilirubin: 0.4 mg/dL (ref 0.0–1.2)
Total Protein: 6.4 g/dL — ABNORMAL LOW (ref 6.5–8.1)

## 2024-03-03 LAB — LACTATE DEHYDROGENASE: LDH: 193 U/L — ABNORMAL HIGH (ref 98–192)

## 2024-03-03 MED ORDER — HEPARIN SOD (PORK) LOCK FLUSH 100 UNIT/ML IV SOLN: 500.0000 [IU] | Freq: Once | INTRAVENOUS | Status: DC

## 2024-03-03 MED ORDER — SODIUM CHLORIDE 0.9% FLUSH
10.0000 mL | Freq: Once | INTRAVENOUS | Status: AC | PRN
  Administered 2024-03-03: 10 mL

## 2024-03-03 MED ORDER — SODIUM CHLORIDE 0.9 % IV SOLN: Freq: Once | INTRAVENOUS | Status: AC

## 2024-03-03 MED ORDER — SODIUM CHLORIDE 0.9% FLUSH: 10.0000 mL | INTRAVENOUS | Status: DC | PRN

## 2024-03-03 MED ORDER — HEPARIN SOD (PORK) LOCK FLUSH 100 UNIT/ML IV SOLN
500.0000 [IU] | Freq: Once | INTRAVENOUS | Status: AC | PRN
  Administered 2024-03-03: 500 [IU]

## 2024-03-03 NOTE — Patient Instructions (Signed)
 Dehydration, Adult Dehydration is a condition in which there is not enough water or other fluids in the body. This happens when a person loses more fluids than they take in. Important organs cannot work right without the right amount of fluids. Any loss of fluids from the body can cause dehydration. Dehydration can be mild, worse, or very bad. It should be treated right away to keep it from getting very bad. What are the causes? Conditions that cause loss of water in the body. They include: Watery poop (diarrhea). Vomiting. Sweating a lot. Fever. Infection. Peeing (urinating) a lot. Not drinking enough fluids. Certain medicines, such as medicines that take extra fluid out of the body (diuretics). Lack of safe drinking water. Not being able to get enough water and food. What increases the risk? Having a long-term (chronic) illness that has not been treated the right way, such as: Diabetes. Heart disease. Kidney disease. Being 25 years of age or older. Having a disability. Living in a place that is high above the ground or sea (high in altitude). The thinner, drier air causes more fluid loss. Doing exercises that put stress on your body for a long time. Being active when in hot places. What are the signs or symptoms? Symptoms of dehydration depend on how bad it is. Mild or worse dehydration Thirst. Dry lips or dry mouth. Feeling dizzy or light-headed. Muscle cramps. Passing little pee or dark pee. Pee may be the color of tea. Headache. Very bad dehydration Changes in skin. Skin may: Be cold to the touch (clammy). Be blotchy or pale. Not go back to normal right after you pinch it and let it go. Little or no tears, pee, or sweat. Fast breathing. Low blood pressure. Weak pulse. Pulse that is more than 100 beats a minute when you are sitting still. Other changes, such as: Feeling very thirsty. Eyes that look hollow (sunken). Cold hands and feet. Being confused. Being very  tired (lethargic) or having trouble waking from sleep. Losing weight. Loss of consciousness. How is this treated? Treatment for this condition depends on how bad your dehydration is. Treatment should start right away. Do not wait until your condition gets very bad. Very bad dehydration is an emergency. You will need to go to a hospital. Mild or worse dehydration can be treated at home. You may be asked to: Drink more fluids. Drink an oral rehydration solution (ORS). This drink gives you the right amount of fluids, salts, and minerals (electrolytes). Very bad dehydration can be treated: With fluids through an IV tube. By correcting low levels of electrolytes in the body. By treating the problem that caused your dehydration. Follow these instructions at home: Oral rehydration solution If told by your doctor, drink an ORS: Make an ORS. Use instructions on the package. Start by drinking small amounts, about  cup (120 mL) every 5-10 minutes. Slowly drink more until you have had the amount that your doctor said to have.  Eating and drinking  Drink enough clear fluid to keep your pee pale yellow. If you were told to drink an ORS, finish the ORS first. Then, start slowly drinking other clear fluids. Drink fluids such as: Water. Do not drink only water. Doing that can make the salt (sodium) level in your body get too low. Water from ice chips you suck on. Fruit juice that you have added water to (diluted). Low-calorie sports drinks. Eat foods that have the right amounts of salts and minerals, such as bananas, oranges, potatoes,  tomatoes, or spinach. Do not drink alcohol. Avoid drinks that have caffeine or sugar. These include:: High-calorie sports drinks. Fruit juice that you did not add water to. Soda. Coffee or energy drinks. Avoid foods that are greasy or have a lot of fat or sugar. General instructions Take over-the-counter and prescription medicines only as told by your doctor. Do  not take sodium tablets. Doing that can make the salt level in your body get too high. Return to your normal activities as told by your doctor. Ask your doctor what activities are safe for you. Keep all follow-up visits. Your doctor may check and change your treatment. Contact a doctor if: You have pain in your belly (abdomen) and the pain: Gets worse. Stays in one place. You have a rash. You have a stiff neck. You get angry or annoyed more easily than normal. You are more tired or have a harder time waking than normal. You feel weak or dizzy. You feel very thirsty. Get help right away if: You have any symptoms of very bad dehydration. You vomit every time you eat or drink. Your vomiting gets worse, does not go away, or you vomit blood or green stuff. You are getting treatment, but symptoms are getting worse. You have a fever. You have a very bad headache. You have: Diarrhea that gets worse or does not go away. Blood in your poop (stool). This may cause poop to look black and tarry. No pee in 6-8 hours. Only a small amount of pee in 6-8 hours, and the pee is very dark. You have trouble breathing. These symptoms may be an emergency. Get help right away. Call 911. Do not wait to see if the symptoms will go away. Do not drive yourself to the hospital. This information is not intended to replace advice given to you by your health care provider. Make sure you discuss any questions you have with your health care provider. Document Revised: 05/25/2022 Document Reviewed: 05/25/2022 Elsevier Patient Education  2024 [employer].

## 2024-03-03 NOTE — Patient Instructions (Signed)

## 2024-03-03 NOTE — Progress Notes (Signed)
 Hematology and Oncology Follow Up Visit  Tami [name] [id number] [birth year] 68 y.o. 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 She is recuperating from being in the ED last night with what sounds like a UTI. She was given the single dose Fosfomycin along with IV fluids.  She was having abdominal pain, feeling "off" and fatigue.  CBC with diff and CMP stable.  Last M-spike was improved at 0.3, IgG level was 1,008 mg/dL and kappa light chains 6.0 mg/L. No fever, chills, n/v, cough, rash, SOB, chest pain, palpitations or changes in bowel or bladder habits.  No swelling, tenderness, numbness or tingling in her extremities at this time.  No falls or syncope.  Appetite and hydration are good. Weight is stable 192 lbs.   ECOG Performance Status: 1 - Symptomatic but completely ambulatory  Medications:  Allergies as of 03/03/2024       Reactions   Benazepril  Anaphylaxis, Swelling, Hives   angioedema Throat and lip swelling   Ondansetron  Hcl Hives   Redness and hives post IV admin on 07/05/17   Codeine Nausea And Vomiting   Morphine Hives   Redness and hives noted post IV admin on 07/05/17        Medication List        Accurate as of March 03, 2024 10:37 AM. If you have any questions, ask your nurse or doctor.          acetaminophen  500 MG tablet Commonly known as: TYLENOL  Take 500 mg by mouth every 6 (six) hours as needed.   acyclovir  400 MG tablet Commonly known as: ZOVIRAX  TAKE ONE (1)  TABLET BY MOUTH TWO (2) TIMES DAILY   albuterol  (2.5 MG/3ML) 0.083% nebulizer solution Commonly known as: PROVENTIL  Take 3 mLs (2.5 mg total) by nebulization every 6 (six) hours as needed for wheezing or shortness of breath.   albuterol  108 (90 Base) MCG/ACT inhaler Commonly known as: VENTOLIN  HFA Inhale 2 puffs into the lungs every 6 (six) hours as needed for wheezing or shortness of breath.   ALPRAZolam  0.25 MG tablet Commonly known as: XANAX  Take 1-2 tablets (0.25-0.5 mg total) by mouth 3 (three) times daily as needed for anxiety.   amLODipine  5 MG tablet Commonly known as: NORVASC  Take 5 mg by mouth daily.   aspirin  EC 81 MG tablet Take 81 mg by mouth daily. Swallow whole.   budesonide  0.5 MG/2ML nebulizer solution Commonly known as: Pulmicort  Take 2 mLs (0.5 mg total) by nebulization 2 (two) times daily as needed (cough, wheezing or shortness of breath).   celecoxib  100 MG capsule Commonly known as: CELEBREX  Take 100 mg by mouth 2 (two) times daily.   CENTRUM SILVER  WOMEN 50+ PO Take 1 tablet by mouth daily at 6 (six) AM.   cetirizine  10 MG tablet Commonly known as: ZYRTEC  Take 10 mg by mouth at bedtime.   cholestyramine  light 4 g packet Commonly known as: PREVALITE  Take 4 g by mouth  2 (two) times daily.   cyclobenzaprine  5 MG tablet Commonly known as: FLEXERIL  Take 1 tablet (5 mg total) by mouth 2 (two) times daily as needed for up to 10 doses for muscle spasms.   dicyclomine  10 MG capsule Commonly known as: BENTYL  Take 1 capsule (10 mg total) by mouth 4 (four) times daily -  before meals and at bedtime. cramping   diphenoxylate -atropine  2.5-0.025 MG tablet Commonly known as: Lomotil  Take 1 tablet by mouth 2 (two) times daily.   estradiol  0.1 MG/GM vaginal cream Commonly known as: ESTRACE  Place 0.5 g vaginally 2 (two) times a week. Place 0.5g nightly for two weeks then twice a week after   famotidine  20 MG tablet Commonly known as: PEPCID  Take 1 tablet  (20 mg total) by mouth at bedtime.   ferrous sulfate 325 (65 FE) MG tablet Commonly known as: FeroSul Take 1 tablet (325 mg total) by mouth daily with breakfast.   FLORASTOR PO Take 500 mg by mouth daily at 6 (six) AM.   fluticasone  50 MCG/ACT nasal spray Commonly known as: FLONASE  Place 2 sprays into both nostrils daily.   furosemide  20 MG tablet Commonly known as: LASIX  Take 20 mg by mouth daily as needed (fluid retention (feet swelling)).   Gemtesa  75 MG Tabs Generic drug: Vibegron  Take 1 tablet (75 mg total) by mouth daily.   Gemtesa  75 MG Tabs Generic drug: Vibegron  Take 1 tablet (75 mg total) by mouth daily.   HYDROcodone  bit-homatropine 5-1.5 MG/5ML syrup Commonly known as: Hydromet Take 5 mLs by mouth every 8 (eight) hours as needed for cough.   hydrOXYzine  10 MG tablet Commonly known as: ATARAX  TAKE ONE (1) TABLET BY MOUTH 3 TIMES DAILY AS NEEDED FOR ITCHING   hyoscyamine  0.125 MG SL tablet Commonly known as: LEVSIN SL Place 1 tablet (0.125 mg total) under the tongue every 4 (four) hours as needed.   lidocaine  5 % ointment Commonly known as: XYLOCAINE  Apply 1 Application topically as needed.   lidocaine -prilocaine  cream Commonly known as: EMLA  Apply 1 Application topically as needed.   loperamide  2 MG capsule Commonly known as: IMODIUM  Take 4 mg by mouth in the morning and at bedtime.   losartan  50 MG tablet Commonly known as: COZAAR  Take 50 mg by mouth in the morning and at bedtime.   Magnesium  Glycinate 100 MG Caps Take 200 mg by mouth at bedtime.   meclizine  12.5 MG tablet Commonly known as: ANTIVERT  Take 12.5 mg by mouth 2 (two) times daily as needed for dizziness or nausea.   metoprolol  succinate 50 MG 24 hr tablet Commonly known as: TOPROL -XL Take 1 tablet (50 mg total) by mouth 2 (two) times daily. What changed: when to take this   metroNIDAZOLE  0.75 % gel Commonly known as: METROGEL  Insert One applicatorful (5 g ) vaginally at  bedtime for 5 days   montelukast  10 MG tablet Commonly known as: Singulair  Take 1 tablet (10 mg total) by mouth at bedtime. Please start taking this on September 4.   neomycin -polymyxin-hydrocortisone OTIC solution Commonly known as: CORTISPORIN Place 4 drops into both ears 4 (four) times daily.   nortriptyline  10 MG capsule Commonly known as: Pamelor  Take 1 capsule (10 mg total) by mouth at bedtime.   omeprazole  20 MG capsule Commonly known as: PRILOSEC SMARTSIG:1.0 Capsule(s) By Mouth Daily   ondansetron  8 MG tablet Commonly known as: Zofran  Take 1 tablet (8 mg total) by mouth every 8 (eight) hours as needed for nausea or vomiting.  oxyCODONE -acetaminophen  5-325 MG tablet Commonly known as: Percocet Take 2 tablets by mouth every 6 (six) hours as needed.   pantoprazole  40 MG tablet Commonly known as: PROTONIX  Take 1 tablet (40 mg total) by mouth daily.   phenazopyridine  200 MG tablet Commonly known as: Pyridium  Take 1 tablet (200 mg total) by mouth 3 (three) times daily as needed for pain.   Pillow Mask/Adult Misc   potassium chloride  SA 20 MEQ tablet Commonly known as: KLOR-CON  M TAKE 1 TABLET BY MOUTH EVERY MONDAY, WEDNESDAY, AND FRIDAY.   predniSONE  20 MG tablet Commonly known as: DELTASONE  Take 1 tablet (20 mg total) by mouth daily with breakfast.   prochlorperazine  10 MG tablet Commonly known as: COMPAZINE  Take 0.5 tablets (5 mg total) by mouth 2 (two) times daily as needed for nausea or vomiting.   scopolamine  1 MG/3DAYS Commonly known as: TRANSDERM-SCOP Place 1 patch (1.5 mg total) onto the skin every 3 (three) days.   sertraline  50 MG tablet Commonly known as: ZOLOFT  Take 1 tablet (50 mg total) by mouth daily.   spironolactone  25 MG tablet Commonly known as: ALDACTONE  Take 25 mg by mouth daily.   Vitamin D3 50 MCG (2000 UT) Tabs Take 2,000 Units by mouth in the morning.        Allergies:  Allergies  Allergen Reactions   Benazepril   Anaphylaxis, Swelling and Hives    angioedema Throat and lip swelling   Ondansetron  Hcl Hives    Redness and hives post IV admin on 07/05/17   Codeine Nausea And Vomiting   Morphine Hives    Redness and hives noted post IV admin on 07/05/17    Past Medical History, Surgical history, Social history, and Family History were reviewed and updated.  Review of Systems: All other 10 point review of systems is negative.   Physical Exam:  height is 5\' 5"  (1.651 m) and weight is 192 lb (87.1 kg). Her oral temperature is 97.8 F (36.6 C). Her blood pressure is 150/85 (abnormal) and her pulse is 89. Her respiration is 18 and oxygen saturation is 100%.   Wt Readings from Last 3 Encounters:  03/03/24 192 lb (87.1 kg)  03/02/24 193 lb 12.8 oz (87.9 kg)  02/24/24 193 lb 12.8 oz (87.9 kg)    Ocular: Sclerae unicteric, pupils equal, round and reactive to light Ear-nose-throat: Oropharynx clear, dentition fair Lymphatic: No cervical or supraclavicular adenopathy Lungs no rales or rhonchi, good excursion bilaterally Heart regular rate and rhythm, no murmur appreciated Abd soft, nontender, positive bowel sounds MSK no focal spinal tenderness, no joint edema Neuro: non-focal, well-oriented, appropriate affect Breasts: Deferred   Lab Results  Component Value Date   WBC 4.9 03/03/2024   HGB 11.9 (L) 03/03/2024   HCT 36.7 03/03/2024   MCV 91.8 03/03/2024   PLT 158 03/03/2024   Lab Results  Component Value Date   FERRITIN 166 11/26/2023   IRON  76 11/26/2023   TIBC 385 11/26/2023   UIBC 309 11/26/2023   IRONPCTSAT 20 11/26/2023   Lab Results  Component Value Date   RETICCTPCT 1.2 11/27/2022   RBC 4.00 03/03/2024   RETICCTABS 32,640 07/16/2016   Lab Results  Component Value Date   KPAFRELGTCHN 6.0 02/18/2024   LAMBDASER 1.8 (L) 02/18/2024   KAPLAMBRATIO 3.33 (H) 02/18/2024   Lab Results  Component Value Date   IGGSERUM 1,008 02/18/2024   IGGSERUM 943 02/18/2024   IGA 34 (L)  02/18/2024   IGA 36 (L) 02/18/2024   IGMSERUM 23 (L)  02/18/2024   IGMSERUM 21 (L) 02/18/2024   Lab Results  Component Value Date   TOTALPROTELP 6.3 02/18/2024   ALBUMINELP 3.5 02/18/2024   A1GS 0.2 02/18/2024   A2GS 0.8 02/18/2024   BETS 1.0 02/18/2024   BETA2SER 0.5 07/16/2016   GAMS 0.7 02/18/2024   MSPIKE 0.3 (H) 02/18/2024   SPEI Comment 12/24/2023     Chemistry      Component Value Date/Time   NA 142 03/03/2024 0943   NA 143 11/08/2017 1127   NA 140 12/31/2016 1000   K 3.9 03/03/2024 0943   K 4.0 11/08/2017 1127   K 3.6 12/31/2016 1000   CL 108 03/03/2024 0943   CL 107 11/08/2017 1127   CO2 25 03/03/2024 0943   CO2 25 11/08/2017 1127   CO2 24 12/31/2016 1000   BUN 15 03/03/2024 0943   BUN 19 11/08/2017 1127   BUN 22.3 12/31/2016 1000   CREATININE 0.79 03/03/2024 0943   CREATININE 0.9 11/08/2017 1127   CREATININE 0.8 12/31/2016 1000      Component Value Date/Time   CALCIUM 9.4 03/03/2024 0943   CALCIUM 9.9 11/08/2017 1127   CALCIUM 9.5 12/31/2016 1000   ALKPHOS 49 03/03/2024 0943   ALKPHOS 44 11/08/2017 1127   ALKPHOS 51 12/31/2016 1000   AST 17 03/03/2024 0943   AST 28 12/31/2016 1000   ALT 14 03/03/2024 0943   ALT 36 11/08/2017 1127   ALT 16 12/31/2016 1000   BILITOT 0.4 03/03/2024 0943   BILITOT 0.46 12/31/2016 1000       Impression and Plan: Ms. Ruffino is a very pleasant 68 yo African American female with a previous IgG kappa MGUS with progression to myeloma. Again, she has responded very nicely to treatment.  1/2 liter of fluids today. She is still having diarrhea 2 times a day.  Protein studies have remained stable. Today's results pending.  MD follow-up in 3 weeks.   Kennard Pea, NP 4/25/202510:37 AM

## 2024-03-04 LAB — IGG, IGA, IGM
IgA: 31 mg/dL — ABNORMAL LOW (ref 87–352)
IgG (Immunoglobin G), Serum: 930 mg/dL (ref 586–1602)
IgM (Immunoglobulin M), Srm: 20 mg/dL — ABNORMAL LOW (ref 26–217)

## 2024-03-06 ENCOUNTER — Telehealth: Payer: Self-pay

## 2024-03-06 DIAGNOSIS — Z452 Encounter for adjustment and management of vascular access device: Secondary | ICD-10-CM | POA: Diagnosis not present

## 2024-03-06 DIAGNOSIS — R197 Diarrhea, unspecified: Secondary | ICD-10-CM | POA: Diagnosis not present

## 2024-03-06 DIAGNOSIS — I5020 Unspecified systolic (congestive) heart failure: Secondary | ICD-10-CM | POA: Diagnosis not present

## 2024-03-06 DIAGNOSIS — K529 Noninfective gastroenteritis and colitis, unspecified: Secondary | ICD-10-CM | POA: Diagnosis not present

## 2024-03-06 DIAGNOSIS — Z95810 Presence of automatic (implantable) cardiac defibrillator: Secondary | ICD-10-CM | POA: Diagnosis not present

## 2024-03-06 DIAGNOSIS — R0789 Other chest pain: Secondary | ICD-10-CM | POA: Diagnosis not present

## 2024-03-06 DIAGNOSIS — I110 Hypertensive heart disease with heart failure: Secondary | ICD-10-CM | POA: Diagnosis not present

## 2024-03-06 DIAGNOSIS — R0602 Shortness of breath: Secondary | ICD-10-CM | POA: Diagnosis not present

## 2024-03-06 DIAGNOSIS — R079 Chest pain, unspecified: Secondary | ICD-10-CM | POA: Diagnosis not present

## 2024-03-06 LAB — PROTEIN ELECTROPHORESIS, SERUM
A/G Ratio: 1.2 (ref 0.7–1.7)
Albumin ELP: 3.6 g/dL (ref 2.9–4.4)
Alpha-1-Globulin: 0.2 g/dL (ref 0.0–0.4)
Alpha-2-Globulin: 1 g/dL (ref 0.4–1.0)
Beta Globulin: 1 g/dL (ref 0.7–1.3)
Gamma Globulin: 0.8 g/dL (ref 0.4–1.8)
Globulin, Total: 2.9 g/dL (ref 2.2–3.9)
M-Spike, %: 0.2 g/dL — ABNORMAL HIGH
Total Protein ELP: 6.5 g/dL (ref 6.0–8.5)

## 2024-03-06 LAB — KAPPA/LAMBDA LIGHT CHAINS
Kappa free light chain: 6.1 mg/L (ref 3.3–19.4)
Kappa, lambda light chain ratio: >4.07 — ABNORMAL HIGH (ref 0.26–1.65)
Lambda free light chains: <1.5 mg/L — ABNORMAL LOW (ref 5.7–26.3)

## 2024-03-06 NOTE — Transitions of Care (Post Inpatient/ED Visit) (Signed)
   03/06/2024  Name: Tami [name] MRN: [id number] DOB: [birth year]  Today's TOC FU Call Status: Today's TOC FU Call Status:: Unsuccessful Call (1st Attempt) Unsuccessful Call (1st Attempt) Date: 03/06/24  Attempted to reach the patient regarding the most recent Inpatient/ED visit.  Follow Up Plan: Additional outreach attempts will be made to reach the patient to complete the Transitions of Care (Post Inpatient/ED visit) call.   Signature  Se[place]ron [place], LPN [facility] Health Advisor [place] [facility] You Are. We Are. [facility] [place] Direct Dial  [phone]

## 2024-03-07 ENCOUNTER — Telehealth: Payer: Self-pay

## 2024-03-07 DIAGNOSIS — I498 Other specified cardiac arrhythmias: Secondary | ICD-10-CM | POA: Diagnosis not present

## 2024-03-07 DIAGNOSIS — I493 Ventricular premature depolarization: Secondary | ICD-10-CM | POA: Diagnosis not present

## 2024-03-07 NOTE — Transitions of Care (Post Inpatient/ED Visit) (Unsigned)
   03/07/2024  Name: Tami [name] MRN: [id number] DOB: Mar 08, 1956  Today's TOC FU Call Status: Today's TOC FU Call Status:: Unsuccessful Call (1st Attempt) Unsuccessful Call (1st Attempt) Date: 03/07/24  Attempted to reach the patient regarding the most recent Inpatient/ED visit.  Follow Up Plan: Additional outreach attempts will be made to reach the patient to complete the Transitions of Care (Post Inpatient/ED visit) call.   Signature Darrall Ellison, LPN [facility] Nurse Health Advisor Direct Dial  [phone]

## 2024-03-08 ENCOUNTER — Emergency Department (HOSPITAL_BASED_OUTPATIENT_CLINIC_OR_DEPARTMENT_OTHER)
Admission: EM | Admit: 2024-03-08 | Discharge: 2024-03-08 | Disposition: A | Discharge status: Home / Self Care | Attending: Emergency Medicine | Admitting: Emergency Medicine

## 2024-03-08 ENCOUNTER — Other Ambulatory Visit: Payer: Self-pay

## 2024-03-08 ENCOUNTER — Encounter (HOSPITAL_BASED_OUTPATIENT_CLINIC_OR_DEPARTMENT_OTHER): Payer: Self-pay | Admitting: Emergency Medicine

## 2024-03-08 ENCOUNTER — Emergency Department (HOSPITAL_BASED_OUTPATIENT_CLINIC_OR_DEPARTMENT_OTHER)

## 2024-03-08 ENCOUNTER — Inpatient Hospital Stay: Admitting: Student

## 2024-03-08 DIAGNOSIS — R519 Headache, unspecified: Secondary | ICD-10-CM | POA: Insufficient documentation

## 2024-03-08 DIAGNOSIS — Z09 Encounter for follow-up examination after completed treatment for conditions other than malignant neoplasm: Secondary | ICD-10-CM

## 2024-03-08 DIAGNOSIS — I509 Heart failure, unspecified: Secondary | ICD-10-CM | POA: Diagnosis not present

## 2024-03-08 DIAGNOSIS — M4184 Other forms of scoliosis, thoracic region: Secondary | ICD-10-CM | POA: Diagnosis not present

## 2024-03-08 DIAGNOSIS — S29019A Strain of muscle and tendon of unspecified wall of thorax, initial encounter: Secondary | ICD-10-CM

## 2024-03-08 DIAGNOSIS — Z7982 Long term (current) use of aspirin: Secondary | ICD-10-CM | POA: Diagnosis not present

## 2024-03-08 DIAGNOSIS — Z79899 Other long term (current) drug therapy: Secondary | ICD-10-CM | POA: Insufficient documentation

## 2024-03-08 DIAGNOSIS — Z8673 Personal history of transient ischemic attack (TIA), and cerebral infarction without residual deficits: Secondary | ICD-10-CM | POA: Insufficient documentation

## 2024-03-08 DIAGNOSIS — S161XXA Strain of muscle, fascia and tendon at neck level, initial encounter: Secondary | ICD-10-CM

## 2024-03-08 DIAGNOSIS — M47816 Spondylosis without myelopathy or radiculopathy, lumbar region: Secondary | ICD-10-CM | POA: Diagnosis not present

## 2024-03-08 DIAGNOSIS — S39012A Strain of muscle, fascia and tendon of lower back, initial encounter: Secondary | ICD-10-CM | POA: Diagnosis not present

## 2024-03-08 DIAGNOSIS — C9000 Multiple myeloma not having achieved remission: Secondary | ICD-10-CM | POA: Diagnosis not present

## 2024-03-08 DIAGNOSIS — Y92410 Unspecified street and highway as the place of occurrence of the external cause: Secondary | ICD-10-CM | POA: Diagnosis not present

## 2024-03-08 DIAGNOSIS — I110 Hypertensive heart disease with heart failure: Secondary | ICD-10-CM | POA: Diagnosis not present

## 2024-03-08 DIAGNOSIS — M47814 Spondylosis without myelopathy or radiculopathy, thoracic region: Secondary | ICD-10-CM | POA: Diagnosis not present

## 2024-03-08 DIAGNOSIS — M47812 Spondylosis without myelopathy or radiculopathy, cervical region: Secondary | ICD-10-CM | POA: Diagnosis not present

## 2024-03-08 DIAGNOSIS — S29012A Strain of muscle and tendon of back wall of thorax, initial encounter: Secondary | ICD-10-CM | POA: Diagnosis not present

## 2024-03-08 DIAGNOSIS — M542 Cervicalgia: Secondary | ICD-10-CM | POA: Diagnosis not present

## 2024-03-08 DIAGNOSIS — S199XXA Unspecified injury of neck, initial encounter: Secondary | ICD-10-CM | POA: Diagnosis present

## 2024-03-08 DIAGNOSIS — M4316 Spondylolisthesis, lumbar region: Secondary | ICD-10-CM | POA: Diagnosis not present

## 2024-03-08 DIAGNOSIS — M549 Dorsalgia, unspecified: Secondary | ICD-10-CM | POA: Diagnosis not present

## 2024-03-08 MED ORDER — HYDROCODONE-ACETAMINOPHEN 5-325 MG PO TABS: 1.0000 | ORAL_TABLET | Freq: Four times a day (QID) | ORAL | 0 refills | Status: DC | PRN

## 2024-03-08 MED ORDER — ACETAMINOPHEN 500 MG PO TABS
1000.0000 mg | ORAL_TABLET | Freq: Once | ORAL | Status: AC
  Administered 2024-03-08: 1000 mg via ORAL
  Filled 2024-03-08: qty 2

## 2024-03-08 NOTE — Transitions of Care (Post Inpatient/ED Visit) (Signed)
 03/08/2024  Name: Tami [name] MRN: [id number] DOB: 1956/09/16  Today's TOC FU Call Status: Today's TOC FU Call Status:: Successful TOC FU Call Completed Unsuccessful Call (1st Attempt) Date: 03/07/24 [facility] FU Call Complete Date: 03/08/24 Patient's Name and Date of Birth confirmed.  Transition Care Management Follow-up Telephone Call Date of Discharge: 03/06/24 Discharge Facility: Other ([facility]) Name of Other ([facility]) Discharge Facility: [facility] Type of Discharge: Emergency Department Reason for ED Visit: Other: (chest pain) How have you been since you were released from the hospital?: Same Any questions or concerns?: No  Items Reviewed: Did you receive and understand the discharge instructions provided?: Yes Medications obtained,verified, and reconciled?: Yes (Medications Reviewed) Any new allergies since your discharge?: No Dietary orders reviewed?: Yes Do you have support at home?: Yes Name of Support/Comfort Primary Source: caregiver  Medications Reviewed Today: Medications Reviewed Today     Reviewed by Darrall Ellison, LPN (Licensed Practical Nurse) on 03/08/24 at 1208  Med List Status: <None>   Medication Order Taking? Sig Documenting Provider Last Dose Status Informant  acetaminophen  (TYLENOL ) 500 MG tablet [id number] No Take 500 mg by mouth every 6 (six) hours as needed. [provider] Taking Active   acyclovir  (ZOVIRAX ) 400 MG tablet [id number] No TAKE ONE (1) TABLET BY MOUTH TWO (2) TIMES DAILY Ennever, Sherryll Donald, MD Taking Active   albuterol  (PROVENTIL ) (2.5 MG/3ML) 0.083% nebulizer solution [id number] No Take 3 mLs (2.5 mg total) by nebulization every 6 (six) hours as needed for wheezing or shortness of breath. Wilfredo Hanly, MD Taking Active   albuterol  (VENTOLIN  HFA) 108 [phone] Base) MCG/ACT inhaler [id number] No Inhale 2 puffs into the lungs every 6 (six) hours as needed for wheezing or shortness of breath. Wilfredo Hanly, MD Taking Active    ALPRAZolam  (XANAX ) 0.25 MG tablet [id number] No Take 1-2 tablets (0.25-0.5 mg total) by mouth 3 (three) times daily as needed for anxiety. Neda Balk, MD Taking Active   amLODipine  (NORVASC ) 5 MG tablet [id number] No Take 5 mg by mouth daily. [provider] Taking Active   aspirin  EC 81 MG tablet [id number] No Take 81 mg by mouth daily. Swallow whole. [provider] Taking Active Self  budesonide  (PULMICORT ) 0.5 MG/2ML nebulizer solution [id number] No Take 2 mLs (0.5 mg total) by nebulization 2 (two) times daily as needed (cough, wheezing or shortness of breath). Wilfredo Hanly, MD Taking Active   celecoxib  (CELEBREX ) 100 MG capsule [id number] No Take 100 mg by mouth 2 (two) times daily. [provider] Taking Active            Med Note Lajuana Pilar, Elodie Haines Jul 08, 2023  9:32 AM) Reports takes as needed  cetirizine  (ZYRTEC ) 10 MG tablet [id number] No Take 10 mg by mouth at bedtime. [provider] Taking Active Self  Cholecalciferol (VITAMIN D3) 50 MCG (2000 UT) TABS [id number] No Take 2,000 Units by mouth in the morning. [provider] Taking Active Self  cholestyramine  light (PREVALITE ) 4 g packet [id number] No Take 4 g by mouth 2 (two) times daily. [provider] Taking Active Self  cyclobenzaprine  (FLEXERIL ) 5 MG tablet [id number] No Take 1 tablet (5 mg total) by mouth 2 (two) times daily as needed for up to 10 doses for muscle spasms. Lowery Rue, DO Taking Active   dicyclomine  (BENTYL ) 10 MG capsule [id number] No Take 1 capsule (10 mg total) by mouth 4 (four) times daily -  before meals and at bedtime. cramping  Neda Balk, MD Taking Expired 03/01/24 2359   diphenoxylate -atropine  (LOMOTIL ) 2.5-0.025 MG tablet [id number] No Take 1 tablet by mouth 2 (two) times daily. Neda Balk, MD Taking Active   estradiol  (ESTRACE ) 0.1 MG/GM vaginal cream [id number] No Place 0.5 g vaginally 2 (two) times a week. Place 0.5g nightly for two  weeks then twice a week after Darlene Ehlers, MD Taking Active   famotidine  (PEPCID ) 20 MG tablet [id number] No Take 1 tablet (20 mg total) by mouth at bedtime. Neda Balk, MD Taking Active   ferrous sulfate (FEROSUL) 325 (65 FE) MG tablet [id number] No Take 1 tablet (325 mg total) by mouth daily with breakfast. Neda Balk, MD Taking Active   fluticasone  (FLONASE ) 50 MCG/ACT nasal spray [id number] No Place 2 sprays into both nostrils daily. Johnella Naas, PA-C Taking Active Self  furosemide  (LASIX ) 20 MG tablet [id number] No Take 20 mg by mouth daily as needed (fluid retention (feet swelling)). [provider] Taking Active Self  HYDROcodone  bit-homatropine (HYDROMET) 5-1.5 MG/5ML syrup [id number] No Take 5 mLs by mouth every 8 (eight) hours as needed for cough. Neda Balk, MD Taking Active   hydrOXYzine  (ATARAX ) 10 MG tablet [id number] No TAKE ONE (1) TABLET BY MOUTH 3 TIMES DAILY AS NEEDED FOR Assunta Lax, MD Taking Active Self  hyoscyamine  (LEVSIN SL) 0.125 MG SL tablet [id number] No Place 1 tablet (0.125 mg total) under the tongue every 4 (four) hours as needed. Neda Balk, MD Taking Active   lidocaine  (XYLOCAINE ) 5 % ointment [id number] No Apply 1 Application topically as needed. Darlene Ehlers, MD Taking Active   lidocaine -prilocaine  (EMLA ) cream [id number] No Apply 1 Application topically as needed. Ivor Mars, MD Taking Active   loperamide  (IMODIUM ) 2 MG capsule [id number] No Take 4 mg by mouth in the morning and at bedtime. [provider] Taking Active Self  losartan  (COZAAR ) 50 MG tablet [id number] No Take 50 mg by mouth in the morning and at bedtime. [provider] Taking Active Self  Magnesium  Glycinate 100 MG CAPS [id number] No Take 200 mg by mouth at bedtime. [provider] Taking Active Self  meclizine  (ANTIVERT ) 12.5 MG tablet [id number] No Take 12.5 mg by mouth 2 (two) times daily as needed for dizziness or nausea. [provider] Taking Active Self  metoprolol  succinate (TOPROL -XL) 50 MG 24 hr tablet [id number] No Take 1 tablet (50 mg total) by mouth 2 (two) times daily.  Patient taking differently: Take 50 mg by mouth daily.   Neda Balk, MD Taking Active Self           Med Note Lajuana Pilar, Elodie Haines Jul 08, 2023  9:22 AM) Reports takes one tablet once a day  metroNIDAZOLE  (METROGEL ) 0.75 % gel [id number] No Insert One applicatorful (5 g ) vaginally at bedtime for 5 days Trenton Frock, PA-C Taking Active   montelukast  (SINGULAIR ) 10 MG tablet [id number] No Take 1 tablet (10 mg total) by mouth at bedtime. Please start taking this on September 4. Ivor Mars, MD Taking Active   Multiple Vitamins-Minerals (CENTRUM SILVER  WOMEN 50+ PO) [id number] No Take 1 tablet by mouth daily at 6 (six) AM. [provider] Taking Active Self  neomycin -polymyxin-hydrocortisone (CORTISPORIN) OTIC solution [id number] No Place 4 drops into both ears 4 (four) times daily. Ivor Mars, MD Taking Active   nitroGLYCERIN  (NITROSTAT ) SL tablet 0.4 mg [id number]   Neda Balk, MD  Active  nortriptyline  (PAMELOR ) 10 MG capsule [id number] No Take 1 capsule (10 mg total) by mouth at bedtime. Neda Balk, MD Taking Active            Med Note Lajuana Pilar, Elodie Haines Jul 08, 2023  9:34 AM) Reports takes as needed  omeprazole  (PRILOSEC) 20 MG capsule [id number] No SMARTSIG:1.0 Capsule(s) By Mouth Daily [provider] Taking Active   ondansetron  (ZOFRAN ) 8 MG tablet [id number] No Take 1 tablet (8 mg total) by mouth every 8 (eight) hours as needed for nausea or vomiting. Ivor Mars, MD Taking Active   oxyCODONE -acetaminophen  (PERCOCET) 5-325 MG tablet [id number] No Take 2 tablets by mouth every 6 (six) hours as needed. Dalene Duck, MD Taking Active   pantoprazole  (PROTONIX ) 40 MG tablet [id number] No Take 1 tablet (40 mg total) by mouth daily. Dorrene Gaucher, NP Taking Active   phenazopyridine   (PYRIDIUM ) 200 MG tablet [id number] No Take 1 tablet (200 mg total) by mouth 3 (three) times daily as needed for pain. Darlene Ehlers, MD Taking Active   potassium chloride  SA (KLOR-CON  M) 20 MEQ tablet [id number] No TAKE 1 TABLET BY MOUTH EVERY MONDAY, WEDNESDAY, AND FRIDAY. Neda Balk, MD Taking Active   predniSONE  (DELTASONE ) 20 MG tablet [id number] No Take 1 tablet (20 mg total) by mouth daily with breakfast. Trenton Frock, PA-C Taking Active   prochlorperazine  (COMPAZINE ) 10 MG tablet [id number] No Take 0.5 tablets (5 mg total) by mouth 2 (two) times daily as needed for nausea or vomiting. Neda Balk, MD Taking Active   Respiratory Therapy Supplies (PILLOW MASK/ADULT) MISC [id number] No  [provider] Taking Active   Saccharomyces boulardii (FLORASTOR PO) [id number] No Take 500 mg by mouth daily at 6 (six) AM. [provider] Taking Active Self  scopolamine  (TRANSDERM-SCOP) 1 MG/3DAYS [id number] No Place 1 patch (1.5 mg total) onto the skin every 3 (three) days. Ivor Mars, MD Taking Active   sertraline  (ZOLOFT ) 50 MG tablet [id number] No Take 1 tablet (50 mg total) by mouth daily. Neda Balk, MD Taking Active   spironolactone  (ALDACTONE ) 25 MG tablet [id number] No Take 25 mg by mouth daily. [provider] Taking Active Self  Vibegron  (GEMTESA ) 75 MG TABS [id number] No Take 1 tablet (75 mg total) by mouth daily. Darlene Ehlers, MD Taking Active   Vibegron  (GEMTESA ) 75 MG TABS [id number] No Take 1 tablet (75 mg total) by mouth daily. Darlene Ehlers, MD Taking Active             Home Care and Equipment/Supplies: Were Home Health Services Ordered?: NA Any new equipment or medical supplies ordered?: NA  Functional Questionnaire: Do you need assistance with bathing/showering or dressing?: No Do you need assistance with meal preparation?: No Do you need assistance with eating?: No Do you have difficulty maintaining continence: No Do you need assistance with  getting out of bed/getting out of a chair/moving?: No Do you have difficulty managing or taking your medications?: No  Follow up appointments reviewed: PCP Follow-up appointment confirmed?: Yes Date of PCP follow-up appointment?: 03/09/24 Follow-up Provider: [facility] Follow-up appointment confirmed?: NA Do you need transportation to your follow-up appointment?: No Do you understand care options if your condition(s) worsen?: Yes-patient verbalized understanding    SIGNATURE Darrall Ellison, LPN [facility] Nurse Health Advisor Direct Dial  [phone]

## 2024-03-08 NOTE — Progress Notes (Deleted)
 Subjective:     [name], female    DOB: [birth year], 68 y.o.   MRN: [id number]  No chief complaint on file.   HPI  Tami [name] is a 68 y.o. female with PMHx of multiple myeloma, TIA, cardiomyopathy, CHF, pacemaker, PAF, ICD, HTN, HLD, asthma, OSA. At clinic today following hosital d/c for chest pain and diarrhea. Per Oncology note- Pt had UTI in ED and was given 1 dose fosfomycin and IV fluids.  Had UTI 2/26 and was seen at [facility], was reffered to UroGYN- Seen 4/17 for possible UTI- UrineDip was negative pt was prescribed metronidazole  02/28/24.C.Diff Result- 02/28/24- negative. Labs 4/25- Elveated glucose 118, last A1C 01/24/24- 5.4%  Last chemo-   Hypomagnesia RBC Urine  She is folowed by Cardiology [facility] Sheilah Denver, Infectious Dz, Oncology, Arabella Knife,   History of Present Illness              Health Maintenance Due  Topic Date Due   COVID-19 Vaccine (7 - 2024-25 season) 07/11/2023    Past Medical History:  Diagnosis Date   Abnormal SPEP 07/26/2016   Arthritis    knees, hands   Back pain 07/14/2016   Bowel obstruction (HCC) 11/2014   C. 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   Multiple myeloma (HCC) 07/20/2018   Multiple myeloma not having achieved remission (HCC) 07/20/2018   Myalgia 12/31/2016   Obstructive sleep apnea 03/31/2015   Presence of permanent cardiac pacemaker    SOB (shortness of breath) 04/09/2016   Stroke (HCC)    TIAs   UTI (urinary  tract infection)     Past Surgical History:  Procedure Laterality Date   ABDOMINAL HYSTERECTOMY     menorraghia, 2006, total   BIOPSY  09/01/2022   Procedure: BIOPSY;  Surgeon: Lajuan Pila, MD;  Location: [facility];  Service: Gastroenterology;;   CHOLECYSTECTOMY     COLONOSCOPY  2018   [facility] per patient   COLONOSCOPY WITH PROPOFOL  N/A 09/01/2022   Procedure: COLONOSCOPY WITH PROPOFOL ;  Surgeon: Lajuan Pila, MD;  Location: [facility];  Service: Gastroenterology;  Laterality: N/A;   ESOPHAGOGASTRODUODENOSCOPY  2018   [facility]    ESOPHAGOGASTRODUODENOSCOPY (EGD) WITH PROPOFOL  N/A 09/01/2022   Procedure: ESOPHAGOGASTRODUODENOSCOPY (EGD) WITH PROPOFOL ;  Surgeon: Lajuan Pila, MD;  Location: [facility];  Service: Gastroenterology;  Laterality: N/A;   IR IMAGING GUIDED PORT INSERTION  06/29/2023   KNEE SURGERY     right, repair torn torn cartialage   PACEMAKER INSERTION  08/2014   changed last in 2023 per pt at [facility]   TONSILLECTOMY     TUBAL LIGATION      Family History  Problem Relation Age of Onset   Diabetes Mother    Hypertension Mother    Heart disease Mother        s/p 1 stent   Hyperlipidemia Mother    Arthritis Mother    Colon cancer Father 21   Irritable bowel syndrome Sister  Hypertension Maternal Grandmother    Arthritis Maternal Grandmother    Heart disease Maternal Grandfather        MI   Hypertension Maternal Grandfather    Arthritis Maternal Grandfather    Hyperlipidemia Daughter    Hypertension Daughter    Hypertension Son    Esophageal cancer Neg Hx    Uterine cancer Neg Hx    Pancreatic cancer Neg Hx     Social History   Socioeconomic History   Marital status: Single    Spouse name: [name]   Number of children: 3   Years of education: [name]   Highest education level: High school graduate  Occupational History   [name]  Tobacco Use   Smoking status: Never    Passive exposure: Never    Smokeless tobacco: Never  Vaping Use   Vaping status: Never Used  Substance and Sexual Activity   Alcohol use: No   Drug use: No   Sexual activity: Not Currently    Partners: Male    Birth control/protection: Post-menopausal  Other Topics Concern   [name]  Social History Narrative   Lives at home alone   Retired   Caffeine: coffee   Social Drivers of Corporate investment banker Strain: Medium Risk (07/08/2023)   Overall Financial Resource Strain (CARDIA)    Difficulty of Paying Living Expenses: Somewhat hard  Food Insecurity: Low Risk  (12/20/2023)   [facility]   Hunger Vital Sign    Worried About Running Out of Food in the Last Year: Never true    Ran Out of Food in the Last Year: Never true  Transportation Needs: No Transportation Needs (12/20/2023)   [facility]   Transportation    In the past 12 months, has lack of reliable transportation kept you from medical appointments, meetings, work or from getting things needed for daily living? : No  Physical Activity: Inactive (07/08/2023)   Exercise Vital Sign    Days of Exercise per Week: 0 days    Minutes of Exercise per Session: 0 min  Stress: No Stress Concern Present (01/21/2023)   [employer] of Occupational Health - Occupational Stress Questionnaire    Feeling of Stress : Not at all  Social Connections: Moderately Isolated (07/08/2023)   Social Connection and Isolation Panel [NHANES]    Frequency of Communication with Friends and Family: More than three times a week    Frequency of Social Gatherings with Friends and Family: Twice a week    Attends Religious Services: More than 4 times per year    Active Member of [employer] or Organizations: No    Attends Banker Meetings: Never    Marital Status: Divorced  Catering manager Violence: Not At Risk (11/29/2023)   Humiliation, Afraid, Rape, and Kick questionnaire    Fear of Current or  Ex-Partner: No    Emotionally Abused: No    Physically Abused: No    Sexually Abused: No    Outpatient Medications Prior to Visit  Medication Sig Dispense Refill   acetaminophen  (TYLENOL ) 500 MG tablet Take 500 mg by mouth every 6 (six) hours as needed.     acyclovir  (ZOVIRAX ) 400 MG tablet TAKE ONE (1) TABLET BY MOUTH TWO (2) TIMES DAILY 60 tablet 3   albuterol  (PROVENTIL ) (2.5 MG/3ML) 0.083% nebulizer solution Take 3 mLs (2.5 mg total) by nebulization every 6 (six) hours as needed for wheezing or  shortness of breath. 150 mL 2   albuterol  (VENTOLIN  HFA) 108 (90 Base) MCG/ACT inhaler Inhale 2 puffs into the lungs every 6 (six) hours as needed for wheezing or shortness of breath. 18 g 5   ALPRAZolam  (XANAX ) 0.25 MG tablet Take 1-2 tablets (0.25-0.5 mg total) by mouth 3 (three) times daily as needed for anxiety. 40 tablet 1   amLODipine  (NORVASC ) 5 MG tablet Take 5 mg by mouth daily.     aspirin  EC 81 MG tablet Take 81 mg by mouth daily. Swallow whole.     budesonide  (PULMICORT ) 0.5 MG/2ML nebulizer solution Take 2 mLs (0.5 mg total) by nebulization 2 (two) times daily as needed (cough, wheezing or shortness of breath). 120 mL 11   celecoxib  (CELEBREX ) 100 MG capsule Take 100 mg by mouth 2 (two) times daily.     cetirizine  (ZYRTEC ) 10 MG tablet Take 10 mg by mouth at bedtime.     Cholecalciferol (VITAMIN D3) 50 MCG (2000 UT) TABS Take 2,000 Units by mouth in the morning.     cholestyramine  light (PREVALITE ) 4 g packet Take 4 g by mouth 2 (two) times daily.     cyclobenzaprine  (FLEXERIL ) 5 MG tablet Take 1 tablet (5 mg total) by mouth 2 (two) times daily as needed for up to 10 doses for muscle spasms. 10 tablet 0   dicyclomine  (BENTYL ) 10 MG capsule Take 1 capsule (10 mg total) by mouth 4 (four) times daily -  before meals and at bedtime. cramping 90 capsule 1   diphenoxylate -atropine  (LOMOTIL ) 2.5-0.025 MG tablet Take 1 tablet by mouth 2 (two) times daily. 60 tablet 6   estradiol  (ESTRACE ) 0.1  MG/GM vaginal cream Place 0.5 g vaginally 2 (two) times a week. Place 0.5g nightly for two weeks then twice a week after 30 g 3   famotidine  (PEPCID ) 20 MG tablet Take 1 tablet (20 mg total) by mouth at bedtime. 30 tablet 2   ferrous sulfate (FEROSUL) 325 (65 FE) MG tablet Take 1 tablet (325 mg total) by mouth daily with breakfast. 90 tablet 0   fluticasone  (FLONASE ) 50 MCG/ACT nasal spray Place 2 sprays into both nostrils daily. 16 g 0   furosemide  (LASIX ) 20 MG tablet Take 20 mg by mouth daily as needed (fluid retention (feet swelling)).     HYDROcodone  bit-homatropine (HYDROMET) 5-1.5 MG/5ML syrup Take 5 mLs by mouth every 8 (eight) hours as needed for cough. 120 mL 0   hydrOXYzine  (ATARAX ) 10 MG tablet TAKE ONE (1) TABLET BY MOUTH 3 TIMES DAILY AS NEEDED FOR ITCHING 30 tablet 2   hyoscyamine  (LEVSIN SL) 0.125 MG SL tablet Place 1 tablet (0.125 mg total) under the tongue every 4 (four) hours as needed. 30 tablet 1   lidocaine  (XYLOCAINE ) 5 % ointment Apply 1 Application topically as needed. 35.44 g 0   lidocaine -prilocaine  (EMLA ) cream Apply 1 Application topically as needed. 30 g 0   loperamide  (IMODIUM ) 2 MG capsule Take 4 mg by mouth in the morning and at bedtime.     losartan  (COZAAR ) 50 MG tablet Take 50 mg by mouth in the morning and at bedtime.     Magnesium  Glycinate 100 MG CAPS Take 200 mg by mouth at bedtime.     meclizine  (ANTIVERT ) 12.5 MG tablet Take 12.5 mg by mouth 2 (two) times daily as needed for dizziness or nausea.     metoprolol  succinate (TOPROL -XL) 50 MG 24 hr tablet Take 1 tablet (50 mg total) by mouth 2 (two) times  daily. (Patient taking differently: Take 50 mg by mouth daily.) 60 tablet 2   metroNIDAZOLE  (METROGEL ) 0.75 % gel Insert One applicatorful (5 g ) vaginally at bedtime for 5 days 45 g 0   montelukast  (SINGULAIR ) 10 MG tablet Take 1 tablet (10 mg total) by mouth at bedtime. Please start taking this on September 4. 30 tablet 3   Multiple Vitamins-Minerals (CENTRUM  SILVER  WOMEN 50+ PO) Take 1 tablet by mouth daily at 6 (six) AM.     neomycin -polymyxin-hydrocortisone (CORTISPORIN) OTIC solution Place 4 drops into both ears 4 (four) times daily. 10 mL 4   nortriptyline  (PAMELOR ) 10 MG capsule Take 1 capsule (10 mg total) by mouth at bedtime. 30 capsule 1   omeprazole  (PRILOSEC) 20 MG capsule SMARTSIG:1.0 Capsule(s) By Mouth Daily     ondansetron  (ZOFRAN ) 8 MG tablet Take 1 tablet (8 mg total) by mouth every 8 (eight) hours as needed for nausea or vomiting. 30 tablet 1   oxyCODONE -acetaminophen  (PERCOCET) 5-325 MG tablet Take 2 tablets by mouth every 6 (six) hours as needed. 10 tablet 0   pantoprazole  (PROTONIX ) 40 MG tablet Take 1 tablet (40 mg total) by mouth daily. 30 tablet 3   phenazopyridine  (PYRIDIUM ) 200 MG tablet Take 1 tablet (200 mg total) by mouth 3 (three) times daily as needed for pain. 30 tablet 0   potassium chloride  SA (KLOR-CON  M) 20 MEQ tablet TAKE 1 TABLET BY MOUTH EVERY MONDAY, WEDNESDAY, AND FRIDAY. 30 tablet 5   predniSONE  (DELTASONE ) 20 MG tablet Take 1 tablet (20 mg total) by mouth daily with breakfast. 5 tablet 0   prochlorperazine  (COMPAZINE ) 10 MG tablet Take 0.5 tablets (5 mg total) by mouth 2 (two) times daily as needed for nausea or vomiting. 60 tablet 1   Respiratory Therapy Supplies (PILLOW MASK/ADULT) MISC      Saccharomyces boulardii (FLORASTOR PO) Take 500 mg by mouth daily at 6 (six) AM.     scopolamine  (TRANSDERM-SCOP) 1 MG/3DAYS Place 1 patch (1.5 mg total) onto the skin every 3 (three) days. 10 patch 1   sertraline  (ZOLOFT ) 50 MG tablet Take 1 tablet (50 mg total) by mouth daily. 30 tablet 3   spironolactone  (ALDACTONE ) 25 MG tablet Take 25 mg by mouth daily.     Vibegron  (GEMTESA ) 75 MG TABS Take 1 tablet (75 mg total) by mouth daily.     Vibegron  (GEMTESA ) 75 MG TABS Take 1 tablet (75 mg total) by mouth daily. 30 tablet 2   Facility-Administered Medications Prior to Visit  Medication Dose Route Frequency Provider Last  Rate Last Admin   acetaminophen  (TYLENOL ) tablet 650 mg  650 mg Oral Once Ennever, Peter R, MD       nitroGLYCERIN  (NITROSTAT ) SL tablet 0.4 mg  0.4 mg Sublingual Once Blyth, Stacey A, MD       sodium chloride  flush (NS) 0.9 % injection 10 mL  10 mL Intracatheter PRN Ivor Mars, MD   10 mL at 09/24/23 1640    Allergies  Allergen Reactions   Benazepril  Anaphylaxis, Swelling and Hives    angioedema Throat and lip swelling   Ondansetron  Hcl Hives    Redness and hives post IV admin on 07/05/17   Codeine Nausea And Vomiting   Morphine Hives    Redness and hives noted post IV admin on 07/05/17    ROS     Objective:    Physical Exam   There were no vitals taken for this visit. Wt Readings from Last 3  Encounters:  03/03/24 192 lb (87.1 kg)  03/02/24 193 lb 12.8 oz (87.9 kg)  02/24/24 193 lb 12.8 oz (87.9 kg)       Assessment & Plan:   Problem List Items Addressed This Visit   None Visit Diagnoses       Hospital discharge follow-up    -  Primary       I am having Devra Fontana A. Gagner maintain her fluticasone , furosemide , losartan , cetirizine , Vitamin D3, Magnesium  Glycinate, meclizine , metoprolol  succinate, aspirin  EC, loperamide , spironolactone , hydrOXYzine , cholestyramine  light, ALPRAZolam , nortriptyline , sertraline , celecoxib , lidocaine -prilocaine , scopolamine , montelukast , Saccharomyces boulardii (FLORASTOR PO), Multiple Vitamins-Minerals (CENTRUM SILVER  WOMEN 50+ PO), ondansetron , acetaminophen , neomycin -polymyxin-hydrocortisone, oxyCODONE -acetaminophen , pantoprazole , famotidine , HYDROcodone  bit-homatropine, amLODipine , Pillow Mask/Adult, potassium chloride  SA, omeprazole , dicyclomine , prochlorperazine , albuterol , albuterol , budesonide , cyclobenzaprine , ferrous sulfate, diphenoxylate -atropine , hyoscyamine , estradiol , Gemtesa , Gemtesa , phenazopyridine , lidocaine , acyclovir , predniSONE , and metroNIDAZOLE . We will continue to administer nitroGLYCERIN .  No orders of the  defined types were placed in this encounter.
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
## 2024-03-09 ENCOUNTER — Encounter: Payer: Self-pay | Admitting: Student

## 2024-03-09 ENCOUNTER — Ambulatory Visit (INDEPENDENT_AMBULATORY_CARE_PROVIDER_SITE_OTHER): Admitting: Student

## 2024-03-09 VITALS — BP 138/88 | HR 96 | Temp 97.9°F | Resp 12 | Ht 65.0 in | Wt 193.2 lb

## 2024-03-09 DIAGNOSIS — R197 Diarrhea, unspecified: Secondary | ICD-10-CM | POA: Diagnosis not present

## 2024-03-09 DIAGNOSIS — M542 Cervicalgia: Secondary | ICD-10-CM

## 2024-03-09 DIAGNOSIS — Z09 Encounter for follow-up examination after completed treatment for conditions other than malignant neoplasm: Secondary | ICD-10-CM

## 2024-03-09 DIAGNOSIS — M545 Low back pain, unspecified: Secondary | ICD-10-CM | POA: Diagnosis not present

## 2024-03-09 DIAGNOSIS — G4733 Obstructive sleep apnea (adult) (pediatric): Secondary | ICD-10-CM | POA: Diagnosis not present

## 2024-03-09 NOTE — Patient Instructions (Addendum)
 Diarrhea: Begin taking Metamucil (psyllium) 1-2 times per day, preferably with meals. Mix one dose in at least 8 ounces (1 full glass) of water and drink immediately. Do not take dry powder alone -- always mix with liquid. Drink plenty of fluids throughout the day to prevent constipation.  Neck Pain/Back pain Recommend alternating acetaminophen  and ibuprofen  as needed for neck and back pain.

## 2024-03-09 NOTE — Assessment & Plan Note (Deleted)
 Recommend alternating acetaminophen  and ibuprofen  as needed for neck and back pain. Pt given pain meds in ED. Pt wearing neck brace.

## 2024-03-09 NOTE — Assessment & Plan Note (Signed)
 Diarrhea: Begin taking Metamucil (psyllium) 1-2 times per day, preferably with meals. Mix one dose in at least 8 ounces (1 full glass) of water and drink immediately. Do not take dry powder alone -- always mix with liquid. Drink plenty of fluids throughout the day to prevent constipation.

## 2024-03-09 NOTE — Assessment & Plan Note (Addendum)
 Encourage heat application, gentle stretching, and activity as tolerated. Follow up if worsening symptoms, radicular pain, numbness/tingling, or weakness. Recommend conservative management with OTC Ibuprofen  200-400mg  every 6 hours as needed, with food. Acetaminophen  500 every 6 hours as needed.

## 2024-03-09 NOTE — Assessment & Plan Note (Signed)
 Encourage heat application, gentle stretching, and activity as tolerated. Follow up if worsening symptoms, radicular pain, numbness/tingling, or weakness. Recommend conservative management with OTC Ibuprofen  200-400mg  every 6 hours as needed, with food. Acetaminophen  500 every 6 hours as needed.
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
## 2024-03-10 ENCOUNTER — Other Ambulatory Visit

## 2024-03-10 ENCOUNTER — Ambulatory Visit: Admitting: Hematology & Oncology

## 2024-03-10 ENCOUNTER — Ambulatory Visit

## 2024-03-10 ENCOUNTER — Inpatient Hospital Stay

## 2024-03-10 ENCOUNTER — Telehealth: Payer: Self-pay | Admitting: Family Medicine

## 2024-03-10 NOTE — Telephone Encounter (Signed)
 Pharmacist w/ [employer] call was returned and advised no fax have came in for the patient and she going to refax the form again.

## 2024-03-10 NOTE — Telephone Encounter (Signed)
 Copied from CRM [id number]. Topic: General - Other >> Mar 09, 2024  3:51 PM [place] E wrote: Reason for CRM: Prentice Brochure, pharmacist with [employer] called in regards to a fax that was sent over on April 10th regarding a recommendation to reduce patients anti-cholinergic medications. Prentice Brochure stated she will be re-faxing this form today and confirmed fax number. Callback number for Prentice Brochure is [phone].

## 2024-03-15 ENCOUNTER — Other Ambulatory Visit: Payer: Self-pay

## 2024-03-15 ENCOUNTER — Ambulatory Visit: Admitting: Internal Medicine

## 2024-03-15 ENCOUNTER — Encounter: Payer: Self-pay | Admitting: Internal Medicine

## 2024-03-15 VITALS — BP 125/83 | HR 69 | Temp 97.9°F | Ht 65.0 in | Wt 194.0 lb

## 2024-03-15 DIAGNOSIS — K529 Noninfective gastroenteritis and colitis, unspecified: Secondary | ICD-10-CM

## 2024-03-15 DIAGNOSIS — R55 Syncope and collapse: Secondary | ICD-10-CM | POA: Diagnosis not present

## 2024-03-15 DIAGNOSIS — R197 Diarrhea, unspecified: Secondary | ICD-10-CM | POA: Diagnosis not present

## 2024-03-15 DIAGNOSIS — Z79899 Other long term (current) drug therapy: Secondary | ICD-10-CM | POA: Diagnosis not present
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
## 2024-03-16 ENCOUNTER — Other Ambulatory Visit (HOSPITAL_BASED_OUTPATIENT_CLINIC_OR_DEPARTMENT_OTHER): Payer: Self-pay

## 2024-03-16 ENCOUNTER — Encounter: Payer: Self-pay | Admitting: Hematology & Oncology

## 2024-03-16 ENCOUNTER — Emergency Department (HOSPITAL_BASED_OUTPATIENT_CLINIC_OR_DEPARTMENT_OTHER)
Admission: EM | Admit: 2024-03-16 | Discharge: 2024-03-16 | Disposition: A | Discharge status: Home / Self Care | Attending: Emergency Medicine | Admitting: Emergency Medicine

## 2024-03-16 ENCOUNTER — Ambulatory Visit: Payer: Self-pay

## 2024-03-16 ENCOUNTER — Other Ambulatory Visit: Payer: Self-pay

## 2024-03-16 DIAGNOSIS — I110 Hypertensive heart disease with heart failure: Secondary | ICD-10-CM | POA: Insufficient documentation

## 2024-03-16 DIAGNOSIS — Z79899 Other long term (current) drug therapy: Secondary | ICD-10-CM | POA: Insufficient documentation

## 2024-03-16 DIAGNOSIS — Z7982 Long term (current) use of aspirin: Secondary | ICD-10-CM | POA: Diagnosis not present

## 2024-03-16 DIAGNOSIS — I509 Heart failure, unspecified: Secondary | ICD-10-CM | POA: Insufficient documentation

## 2024-03-16 DIAGNOSIS — R300 Dysuria: Secondary | ICD-10-CM | POA: Diagnosis present

## 2024-03-16 DIAGNOSIS — N3000 Acute cystitis without hematuria: Secondary | ICD-10-CM | POA: Diagnosis not present

## 2024-03-16 LAB — COMPREHENSIVE METABOLIC PANEL WITH GFR
ALT: 35 U/L (ref 0–44)
AST: 43 U/L — ABNORMAL HIGH (ref 15–41)
Albumin: 4.4 g/dL (ref 3.5–5.0)
Alkaline Phosphatase: 65 U/L (ref 38–126)
Anion gap: 12 (ref 5–15)
BUN: 17 mg/dL (ref 8–23)
CO2: 25 mmol/L (ref 22–32)
Calcium: 9.9 mg/dL (ref 8.9–10.3)
Chloride: 106 mmol/L (ref 98–111)
Creatinine, Ser: 0.65 mg/dL (ref 0.44–1.00)
GFR, Estimated: >60 mL/min (ref >60)
Glucose, Bld: 110 mg/dL — ABNORMAL HIGH (ref 70–99)
Potassium: 4.3 mmol/L (ref 3.5–5.1)
Sodium: 142 mmol/L (ref 135–145)
Total Bilirubin: 0.4 mg/dL (ref 0.0–1.2)
Total Protein: 7.4 g/dL (ref 6.5–8.1)

## 2024-03-16 LAB — CBC WITH DIFFERENTIAL/PLATELET
Abs Immature Granulocytes: 0.02 10*3/uL (ref 0.00–0.07)
Absolute Lymphocytes: 882 {cells}/uL (ref 850–3900)
Absolute Monocytes: 262 {cells}/uL (ref 200–950)
Basophils Absolute: 42 {cells}/uL (ref 0–200)
Basophils Absolute: 0 10*3/uL (ref 0.0–0.1)
Basophils Relative: 1.1 %
Basophils Relative: 1 %
Eosinophils Absolute: 122 {cells}/uL (ref 15–500)
Eosinophils Absolute: 0.1 10*3/uL (ref 0.0–0.5)
Eosinophils Relative: 3.2 %
Eosinophils Relative: 1 %
HCT: 38.5 % (ref 35.0–45.0)
HCT: 38.9 % (ref 36.0–46.0)
Hemoglobin: 12.5 g/dL (ref 11.7–15.5)
Hemoglobin: 12.7 g/dL (ref 12.0–15.0)
Immature Granulocytes: 1 %
Lymphocytes Relative: 23 %
Lymphs Abs: 1 10*3/uL (ref 0.7–4.0)
MCH: 29.7 pg (ref 27.0–33.0)
MCH: 30 pg (ref 26.0–34.0)
MCHC: 32.5 g/dL (ref 32.0–36.0)
MCHC: 32.6 g/dL (ref 30.0–36.0)
MCV: 91.4 fL (ref 80.0–100.0)
MCV: 92 fL (ref 80.0–100.0)
MPV: 11.9 fL (ref 7.5–12.5)
Monocytes Absolute: 0.2 10*3/uL (ref 0.1–1.0)
Monocytes Relative: 6.9 %
Monocytes Relative: 5 %
Neutro Abs: 2493 {cells}/uL (ref 1500–7800)
Neutro Abs: 3 10*3/uL (ref 1.7–7.7)
Neutrophils Relative %: 65.6 %
Neutrophils Relative %: 69 %
Platelets: 126 10*3/uL — ABNORMAL LOW (ref 140–400)
Platelets: 122 10*3/uL — ABNORMAL LOW (ref 150–400)
RBC: 4.21 10*6/uL (ref 3.80–5.10)
RBC: 4.23 MIL/uL (ref 3.87–5.11)
RDW: 12.3 % (ref 11.0–15.0)
RDW: 12.6 % (ref 11.5–15.5)
Total Lymphocyte: 23.2 %
WBC: 3.8 10*3/uL (ref 3.8–10.8)
WBC: 4.4 10*3/uL (ref 4.0–10.5)
nRBC: 0 % (ref 0.0–0.2)

## 2024-03-16 LAB — URINALYSIS, ROUTINE W REFLEX MICROSCOPIC
Bilirubin Urine: NEGATIVE
Glucose, UA: NEGATIVE mg/dL
Hgb urine dipstick: NEGATIVE
Ketones, ur: NEGATIVE mg/dL
Nitrite: NEGATIVE
Protein, ur: NEGATIVE mg/dL
Specific Gravity, Urine: 1.025 (ref 1.005–1.030)
pH: 5.5 (ref 5.0–8.0)

## 2024-03-16 LAB — BASIC METABOLIC PANEL WITH GFR
BUN: 17 mg/dL (ref 7–25)
CO2: 29 mmol/L (ref 20–32)
Calcium: 9.6 mg/dL (ref 8.6–10.4)
Chloride: 107 mmol/L (ref 98–110)
Creat: 0.63 mg/dL (ref 0.50–1.05)
Glucose, Bld: 98 mg/dL (ref 65–99)
Potassium: 4.3 mmol/L (ref 3.5–5.3)
Sodium: 142 mmol/L (ref 135–146)
eGFR: 96 mL/min/{1.73_m2} (ref >=60)

## 2024-03-16 LAB — URINALYSIS, MICROSCOPIC (REFLEX)

## 2024-03-16 LAB — MAGNESIUM: Magnesium: 1.7 mg/dL (ref 1.5–2.5)

## 2024-03-16 MED ORDER — CEPHALEXIN 500 MG PO CAPS: 500.0000 mg | ORAL_CAPSULE | Freq: Two times a day (BID) | ORAL | 0 refills | Status: DC

## 2024-03-16 MED ORDER — CEPHALEXIN 500 MG PO CAPS
500.0000 mg | ORAL_CAPSULE | Freq: Two times a day (BID) | ORAL | 0 refills | Status: DC
Start: 2024-03-16 — End: 2024-03-21
  Filled 2024-03-16: qty 10, 5d supply, fill #0

## 2024-03-16 MED ORDER — CEPHALEXIN 250 MG PO CAPS
500.0000 mg | ORAL_CAPSULE | Freq: Once | ORAL | Status: AC
  Administered 2024-03-16: 500 mg via ORAL
  Filled 2024-03-16: qty 2

## 2024-03-16 MED ORDER — SODIUM CHLORIDE 0.9 % IV BOLUS
1000.0000 mL | Freq: Once | INTRAVENOUS | Status: AC
  Administered 2024-03-16: 1000 mL via INTRAVENOUS

## 2024-03-16 NOTE — Telephone Encounter (Signed)
  Chief Complaint: back pain Symptoms: severe lower back pain, severe mid abdominal pain, "feels like a yeast infection" Frequency: x 2 weeks, intermittent Pertinent Negatives: Patient denies urinary frequency, burning with urination, loss of bowel or bladder control,  Disposition: [x] ED /[] Urgent Care (no appt availability in office) / [] Appointment(In office/virtual)/ []  [facility]/ [] Home Care/ [] Refused Recommended Disposition /[] [facility]/ []  Follow-up with PCP Additional Notes: Patient declined to finish triage with RN and states she will just wait in the ED, she was becoming frustrated when assessing pain level and asking probing questions.    Copied from CRM [id number]. Topic: Clinical - Medical Advice >> Mar 16, 2024 10:53 AM [place] E wrote: Reason for CRM: Patient currently in the ED, wanting to know if she should stay as she is experiencing intense back pain, rated the pain a level 9 out of 10. Patient also experiencing a possible yeast infection and she felt like she "peed all over herself" even though she did not.   Left message for patient to stay in the ER.  Advised her to call back if she needed anything else.    Reason for Disposition  [1] SEVERE abdominal pain AND [2] present > 1 hour  Answer Assessment - Initial Assessment Questions 1. ONSET: "When did the pain begin?"      X 2 weeks.  2. LOCATION: "Where does it hurt?" (upper, mid or lower back)     Lower.  3. SEVERITY: "How bad is the pain?"  (e.g., Scale 1-10; mild, moderate, or severe)   - MILD (1-3): Doesn't interfere with normal activities.    - MODERATE (4-7): Interferes with normal activities or awakens from sleep.    - SEVERE (8-10): Excruciating pain, unable to do any normal activities.      9/10.  4. PATTERN: "Is the pain constant?" (e.g., yes, no; constant, intermittent)      Intermittent.  5. RADIATION: "Does the pain shoot into your legs or somewhere else?"      Denies.  6. CAUSE:  "What do you think is causing the back pain?"      *No Answer*  7. BACK OVERUSE:  "Any recent lifting of heavy objects, strenuous work or exercise?"     *No Answer*  8. MEDICINES: "What have you taken so far for the pain?" (e.g., nothing, acetaminophen , NSAIDS)     *No Answer*  9. NEUROLOGIC SYMPTOMS: "Do you have any weakness, numbness, or problems with bowel/bladder control?"     *No Answer*  10. OTHER SYMPTOMS: "Do you have any other symptoms?" (e.g., fever, abdomen pain, burning with urination, blood in urine)       Mid abdominal pain 9/10  11. PREGNANCY: "Is there any chance you are pregnant?" "When was your last menstrual period?"       N/A.  Protocols used: Back Pain-A-AH

## 2024-03-16 NOTE — ED Triage Notes (Signed)
 Pt reports pain with urination, nausea, and diarrhea for a week. Concern for UTI.   Reports upper and lower abd pain.

## 2024-03-16 NOTE — Telephone Encounter (Signed)
 Copied from CRM [id number]. Topic: Clinical - Medical Advice >> Mar 16, 2024 10:53 AM [place] E wrote: Reason for CRM: Patient currently in the ED, wanting to know if she should stay as she is experiencing intense back pain, rated the pain a level 9 out of 10. Patient also experiencing a possible yeast infection and she felt like she "peed all over herself" even though she did not.  Left message for patient to stay in the ER.  Advised her to call back if she needed anything else.

## 2024-03-16 NOTE — ED Provider Notes (Signed)
 [place] [facility] AT [facility] Provider Note   CSN: [id number] Arrival date & time: 03/16/24  1011     History  Chief Complaint  Patient presents with   Dysuria   Diarrhea    Tami [name] is a 68 y.o. female.  Patient here with multiple myeloma here with painful urination nausea diarrhea.  History of chronic diarrhea.  Denies any abdominal pain at this time.  Does have some pain with urination.  Denies any weakness numbness tingling.  She feels dehydrated.  No chest pain or shortness of breath.  No fever or chills.  The history is provided by the patient.       Home Medications Prior to Admission medications   Medication Sig Start Date End Date Taking? Authorizing Provider  cephALEXin  (KEFLEX ) 500 MG capsule Take 1 capsule (500 mg total) by mouth 2 (two) times daily for 5 days. 03/16/24 03/21/24 Yes Declin Rajan, DO  acetaminophen  (TYLENOL ) 500 MG tablet Take 500 mg by mouth every 6 (six) hours as needed.    [provider]  acyclovir  (ZOVIRAX ) 400 MG tablet TAKE ONE (1) TABLET BY MOUTH TWO (2) TIMES DAILY 02/08/24   Ivor Mars, MD  albuterol  (PROVENTIL ) (2.5 MG/3ML) 0.083% nebulizer solution Take 3 mLs (2.5 mg total) by nebulization every 6 (six) hours as needed for wheezing or shortness of breath. 12/14/23   Wilfredo Hanly, MD  albuterol  (VENTOLIN  HFA) 108 (90 Base) MCG/ACT inhaler Inhale 2 puffs into the lungs every 6 (six) hours as needed for wheezing or shortness of breath. 12/14/23   Wilfredo Hanly, MD  ALPRAZolam  (XANAX ) 0.25 MG tablet Take 1-2 tablets (0.25-0.5 mg total) by mouth 3 (three) times daily as needed for anxiety. 06/14/23   Neda Balk, MD  amLODipine  (NORVASC ) 5 MG tablet Take 5 mg by mouth daily. 09/14/23   [provider]  aspirin  EC 81 MG tablet Take 81 mg by mouth daily. Swallow whole.    [provider]  budesonide  (PULMICORT ) 0.5 MG/2ML nebulizer solution Take 2 mLs (0.5 mg total) by  nebulization 2 (two) times daily as needed (cough, wheezing or shortness of breath). 12/14/23   Wilfredo Hanly, MD  celecoxib  (CELEBREX ) 100 MG capsule Take 100 mg by mouth 2 (two) times daily. 06/07/23   [provider]  cetirizine  (ZYRTEC ) 10 MG tablet Take 10 mg by mouth at bedtime.    [provider]  Cholecalciferol (VITAMIN D3) 50 MCG (2000 UT) TABS Take 2,000 Units by mouth in the morning.    [provider]  cholestyramine  light (PREVALITE ) 4 g packet Take 4 g by mouth 2 (two) times daily.    [provider]  cyclobenzaprine  (FLEXERIL ) 5 MG tablet Take 1 tablet (5 mg total) by mouth 2 (two) times daily as needed for up to 10 doses for muscle spasms. 01/03/24   Dorette Hartel, DO  dicyclomine  (BENTYL ) 10 MG capsule Take 1 capsule (10 mg total) by mouth 4 (four) times daily -  before meals and at bedtime. cramping 12/02/23 03/01/24  Neda Balk, MD  diphenoxylate -atropine  (LOMOTIL ) 2.5-0.025 MG tablet Take 1 tablet by mouth 2 (two) times daily. 01/24/24   Neda Balk, MD  estradiol  (ESTRACE ) 0.1 MG/GM vaginal cream Place 0.5 g vaginally 2 (two) times a week. Place 0.5g nightly for two weeks then twice a week after 02/03/24   Wyonia Hefty T, MD  famotidine  (PEPCID ) 20 MG tablet Take 1 tablet (20 mg total)  by mouth at bedtime. 09/16/23   Neda Balk, MD  ferrous sulfate (FEROSUL) 325 (65 FE) MG tablet Take 1 tablet (325 mg total) by mouth daily with breakfast. 01/19/24   Neda Balk, MD  fluticasone  (FLONASE ) 50 MCG/ACT nasal spray Place 2 sprays into both nostrils daily. 01/07/20   Joy, Shawn C, PA-C  furosemide  (LASIX ) 20 MG tablet Take 20 mg by mouth daily as needed (fluid retention (feet swelling)). 07/03/20   [provider]  HYDROcodone  bit-homatropine (HYDROMET) 5-1.5 MG/5ML syrup Take 5 mLs by mouth every 8 (eight) hours as needed for cough. 09/20/23   Neda Balk, MD  HYDROcodone -acetaminophen  (NORCO/VICODIN) 5-325 MG tablet Take 1  tablet by mouth every 6 (six) hours as needed for severe pain (pain score 7-10). 03/08/24   Almond Army, MD  hydrOXYzine  (ATARAX ) 10 MG tablet TAKE ONE (1) TABLET BY MOUTH 3 TIMES DAILY AS NEEDED FOR ITCHING 03/03/23   Neda Balk, MD  hyoscyamine  (LEVSIN SL) 0.125 MG SL tablet Place 1 tablet (0.125 mg total) under the tongue every 4 (four) hours as needed. 01/24/24   Neda Balk, MD  Lactobacillus Rhamnosus, GG, (CULTURELLE HEALTH & WELLNESS) CAPS Take 1 capsule by mouth daily. 03/06/24 04/05/24  [provider]  lidocaine  (XYLOCAINE ) 5 % ointment Apply 1 Application topically as needed. 01/31/24   Darlene Ehlers, MD  lidocaine -prilocaine  (EMLA ) cream Apply 1 Application topically as needed. 07/01/23   Ivor Mars, MD  loperamide  (IMODIUM ) 2 MG capsule Take 4 mg by mouth in the morning and at bedtime.    [provider]  losartan  (COZAAR ) 50 MG tablet Take 50 mg by mouth in the morning and at bedtime. 04/14/21   [provider]  Magnesium  Glycinate 100 MG CAPS Take 200 mg by mouth at bedtime. 09/21/22   [provider]  meclizine  (ANTIVERT ) 12.5 MG tablet Take 12.5 mg by mouth 2 (two) times daily as needed for dizziness or nausea. 01/28/22   [provider]  metoprolol  succinate (TOPROL -XL) 50 MG 24 hr tablet Take 1 tablet (50 mg total) by mouth 2 (two) times daily. Patient taking differently: Take 50 mg by mouth daily. 02/23/23   Neda Balk, MD  metroNIDAZOLE  (METROGEL ) 0.75 % gel Insert One applicatorful (5 g ) vaginally at bedtime for 5 days 02/28/24   Drubel, Heidi Llamas, PA-C  montelukast  (SINGULAIR ) 10 MG tablet Take 1 tablet (10 mg total) by mouth at bedtime. Please start taking this on September 4. 07/01/23   Ivor Mars, MD  Multiple Vitamins-Minerals (CENTRUM SILVER  WOMEN 50+ PO) Take 1 tablet by mouth daily at 6 (six) AM.    [provider]  neomycin -polymyxin-hydrocortisone (CORTISPORIN) OTIC solution Place 4 drops into both  ears 4 (four) times daily. 07/30/23   Ivor Mars, MD  nortriptyline  (PAMELOR ) 10 MG capsule Take 1 capsule (10 mg total) by mouth at bedtime. 06/14/23   Neda Balk, MD  omeprazole  (PRILOSEC) 20 MG capsule SMARTSIG:1.0 Capsule(s) By Mouth Daily 08/19/23   [provider]  ondansetron  (ZOFRAN ) 8 MG tablet Take 1 tablet (8 mg total) by mouth every 8 (eight) hours as needed for nausea or vomiting. 07/14/23   Ivor Mars, MD  oxyCODONE -acetaminophen  (PERCOCET) 5-325 MG tablet Take 2 tablets by mouth every 6 (six) hours as needed. 08/07/23   Dalene Duck, MD  pantoprazole  (PROTONIX ) 40 MG tablet Take 1 tablet (40 mg total) by mouth daily. 09/03/23   O'Sullivan, Melissa, NP  phenazopyridine  (PYRIDIUM ) 200 MG tablet Take 1 tablet (200 mg total) by mouth 3 (three) times daily as needed for pain. 01/31/24   Darlene Ehlers, MD  potassium chloride  SA (KLOR-CON  M) 20 MEQ tablet TAKE 1 TABLET BY MOUTH EVERY MONDAY, WEDNESDAY, AND FRIDAY. 10/25/23   Neda Balk, MD  prochlorperazine  (COMPAZINE ) 10 MG tablet Take 0.5 tablets (5 mg total) by mouth 2 (two) times daily as needed for nausea or vomiting. 12/02/23   Neda Balk, MD  Respiratory Therapy Supplies (PILLOW MASK/ADULT) MISC  09/29/23   [provider]  Saccharomyces boulardii (FLORASTOR PO) Take 500 mg by mouth daily at 6 (six) AM.    [provider]  scopolamine  (TRANSDERM-SCOP) 1 MG/3DAYS Place 1 patch (1.5 mg total) onto the skin every 3 (three) days. 07/01/23   Ivor Mars, MD  sertraline  (ZOLOFT ) 50 MG tablet Take 1 tablet (50 mg total) by mouth daily. 06/14/23   Neda Balk, MD  spironolactone  (ALDACTONE ) 25 MG tablet Take 25 mg by mouth daily.    [provider]  Vibegron  (GEMTESA ) 75 MG TABS Take 1 tablet (75 mg total) by mouth daily. 01/31/24   Darlene Ehlers, MD      Allergies    Benazepril , Ondansetron  hcl, Codeine, and Morphine    Review of Systems   Review of Systems  Physical  Exam Updated Vital Signs BP 126/82   Pulse 75   Temp 97.9 F (36.6 C) (Oral)   Resp 18   SpO2 97%  Physical Exam Vitals and nursing note reviewed.  Constitutional:      General: She is not in acute distress.    Appearance: She is well-developed. She is not ill-appearing.  HENT:     Head: Normocephalic and atraumatic.     Nose: Nose normal.     Mouth/Throat:     Mouth: Mucous membranes are moist.  Eyes:     Extraocular Movements: Extraocular movements intact.     Conjunctiva/sclera: Conjunctivae normal.     Pupils: Pupils are equal, round, and reactive to light.  Cardiovascular:     Rate and Rhythm: Normal rate and regular rhythm.     Pulses: Normal pulses.     Heart sounds: Normal heart sounds. No murmur heard. Pulmonary:     Effort: Pulmonary effort is normal. No respiratory distress.     Breath sounds: Normal breath sounds.  Abdominal:     General: Abdomen is flat.     Palpations: Abdomen is soft.     Tenderness: There is no abdominal tenderness.  Musculoskeletal:        General: No swelling.     Cervical back: Normal range of motion and neck supple.  Skin:    General: Skin is warm and dry.     Capillary Refill: Capillary refill takes less than 2 seconds.  Neurological:     General: No focal deficit present.     Mental Status: She is alert.  Psychiatric:        Mood and Affect: Mood normal.     ED Results / Procedures / Treatments   Labs (all labs ordered are listed, but only abnormal results are displayed) Labs Reviewed  CBC WITH DIFFERENTIAL/PLATELET - Abnormal; Notable for the following components:      Result Value   Platelets 122 (*)    All other components within normal limits  COMPREHENSIVE METABOLIC PANEL WITH GFR - Abnormal; Notable for the following components:   Glucose, Bld 110 (*)  AST 43 (*)    All other components within normal limits  URINALYSIS, ROUTINE W REFLEX MICROSCOPIC - Abnormal; Notable for the following components:    Leukocytes,Ua TRACE (*)    All other components within normal limits  URINALYSIS, MICROSCOPIC (REFLEX) - Abnormal; Notable for the following components:   Bacteria, UA RARE (*)    All other components within normal limits    EKG None  Radiology No results found.  Procedures Procedures    Medications Ordered in ED Medications  sodium chloride  0.9 % bolus 1,000 mL (1,000 mLs Intravenous New Bag/Given 03/16/24 1135)  cephALEXin  (KEFLEX ) capsule 500 mg (500 mg Oral Given 03/16/24 1136)    ED Course/ Medical Decision Making/ A&P                                 Medical Decision Making Amount and/or Complexity of Data Reviewed Labs: ordered.  Risk Prescription drug management.   =Taila A Sampat is here with diarrhea concern for dehydration pain with urination.  Patient with multiple myeloma, history of hypertension high cholesterol CHF.  Very well-appearing.  Differential diagnosis likely UTI.  She has no abdominal tenderness on exam.  Have no concern for intra-abdominal process including appendicitis bowel obstruction or other acute process.  Basic labs were checked which were unremarkable.  No significant leukocytosis anemia or electrolyte abnormality.  No kidney injury.  Urinalysis possibly with infection.  Given her symptoms we will treat with Keflex .  She is feeling much better after IV fluids.  Repeat abdominal exams benign.  She has no fever no white count.  She is safe for discharge.  Understands return precautions.  This chart was dictated using voice recognition software.  Despite best efforts to proofread,  errors can occur which can change the documentation meaning.         Final Clinical Impression(s) / ED Diagnoses Final diagnoses:  Acute cystitis without hematuria    Rx / DC Orders ED Discharge Orders          Ordered    cephALEXin  (KEFLEX ) 500 MG capsule  2 times daily        03/16/24 1217              Lowery Rue, DO 03/16/24 1217

## 2024-03-17 ENCOUNTER — Inpatient Hospital Stay: Attending: Hematology & Oncology

## 2024-03-17 VITALS — BP 154/89 | HR 96 | Temp 97.8°F | Resp 17

## 2024-03-17 DIAGNOSIS — R197 Diarrhea, unspecified: Secondary | ICD-10-CM | POA: Insufficient documentation

## 2024-03-17 DIAGNOSIS — C9000 Multiple myeloma not having achieved remission: Secondary | ICD-10-CM | POA: Insufficient documentation

## 2024-03-17 DIAGNOSIS — C9002 Multiple myeloma in relapse: Secondary | ICD-10-CM

## 2024-03-17 DIAGNOSIS — D801 Nonfamilial hypogammaglobulinemia: Secondary | ICD-10-CM

## 2024-03-17 MED ORDER — SODIUM CHLORIDE 0.9 % IV SOLN: INTRAVENOUS | Status: DC

## 2024-03-17 MED ORDER — SODIUM CHLORIDE 0.9% FLUSH
10.0000 mL | Freq: Once | INTRAVENOUS | Status: AC
  Administered 2024-03-17: 10 mL via INTRAVENOUS

## 2024-03-17 MED ORDER — HEPARIN SOD (PORK) LOCK FLUSH 100 UNIT/ML IV SOLN
500.0000 [IU] | Freq: Once | INTRAVENOUS | Status: AC
  Administered 2024-03-17: 500 [IU] via INTRAVENOUS

## 2024-03-17 NOTE — Patient Instructions (Signed)

## 2024-03-21 ENCOUNTER — Ambulatory Visit (INDEPENDENT_AMBULATORY_CARE_PROVIDER_SITE_OTHER): Admitting: Student

## 2024-03-21 ENCOUNTER — Encounter: Payer: Self-pay | Admitting: Student

## 2024-03-21 ENCOUNTER — Other Ambulatory Visit: Payer: Self-pay | Admitting: *Deleted

## 2024-03-21 VITALS — BP 130/80 | HR 81 | Temp 98.2°F | Resp 12 | Ht 65.0 in | Wt 196.0 lb

## 2024-03-21 DIAGNOSIS — R300 Dysuria: Secondary | ICD-10-CM | POA: Diagnosis not present

## 2024-03-21 DIAGNOSIS — M545 Low back pain, unspecified: Secondary | ICD-10-CM

## 2024-03-21 DIAGNOSIS — R112 Nausea with vomiting, unspecified: Secondary | ICD-10-CM

## 2024-03-21 DIAGNOSIS — R2681 Unsteadiness on feet: Secondary | ICD-10-CM

## 2024-03-21 DIAGNOSIS — Z9181 History of falling: Secondary | ICD-10-CM

## 2024-03-21 DIAGNOSIS — N393 Stress incontinence (female) (male): Secondary | ICD-10-CM | POA: Diagnosis not present

## 2024-03-21 LAB — POCT URINALYSIS DIP (MANUAL ENTRY)
Bilirubin, UA: NEGATIVE
Blood, UA: NEGATIVE
Glucose, UA: NEGATIVE mg/dL
Ketones, POC UA: NEGATIVE mg/dL
Nitrite, UA: NEGATIVE
Protein Ur, POC: NEGATIVE mg/dL
Spec Grav, UA: 1.02 (ref 1.010–1.025)
Urobilinogen, UA: 0.2 U/dL
pH, UA: 5 (ref 5.0–8.0)

## 2024-03-21 NOTE — Progress Notes (Signed)
 Subjective:     [name], female    DOB: 04/23/1956, 68 y.o.   MRN: [id number]  Chief Complaint  Patient presents with   ER follow up    Cystitis     HPI Presents for FU following  ED visit for acute cystitis without hematuria. Treated with a 5-day course of Keflex , which has been completed. Patient is followed by Urogynecology for chronic UTIs and pelvic pain; last seen on 01/31/2044.   Patient denies fever, chills, shortness of breath, chest pain, palpitations, dyspnea on exertion, edema, headache, vision changes, abdominal pain, rash, or weight changes. Positive for fatigue.  Pain Location: RUQ  Severity: 9/10 Quality: Aching  Duration/Frequency: constant ; Better with: ginger ale, pain medication, laying down ;Worse with: pain worse after  Symptoms Nausea/Vomiting: yes  Appetite: decreased appetite Diarrhea: yes  Constipation: no  Melena/BRBPR: no  Hematemesis: no  Heartburn: no  Anorexia: no  Fever/Chills: no  Dysuria: no,  Hematuria: no  Rash/Jaundice: no  Wt loss: no  EtOH use: no  NSAIDs/ASA: yes--- Tylenol , occasionally for pain LMP: Post menopausal Vaginal bleeding: no  STD risk/hx: no  +N/V/D:yes + RUQ pain: yes       History of Present Illness              There are no preventive care reminders to display for this patient.  Past Medical History:  Diagnosis Date   Abnormal SPEP 07/26/2016   Arthritis    knees, hands   Back pain 07/14/2016   Bowel obstruction (HCC) 11/2014   C. 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   Multiple myeloma (HCC) 07/20/2018   Multiple myeloma not having achieved remission (HCC) 07/20/2018   Myalgia 12/31/2016   Obstructive sleep apnea 03/31/2015   Presence of permanent cardiac pacemaker    SOB (shortness of breath) 04/09/2016   Stroke (HCC)    TIAs   UTI (urinary tract infection)     Past Surgical History:  Procedure Laterality Date   ABDOMINAL HYSTERECTOMY     menorraghia, 2006, total   BIOPSY  09/01/2022   Procedure: BIOPSY;  Surgeon: Lajuan Pila, MD;  Location: [facility];  Service: Gastroenterology;;   CHOLECYSTECTOMY     COLONOSCOPY  2018   [facility] per patient   COLONOSCOPY WITH PROPOFOL  N/A 09/01/2022   Procedure: COLONOSCOPY WITH PROPOFOL ;  Surgeon: Lajuan Pila, MD;  Location: [facility];  Service: Gastroenterology;  Laterality: N/A;   ESOPHAGOGASTRODUODENOSCOPY  2018   [facility]    ESOPHAGOGASTRODUODENOSCOPY (EGD) WITH PROPOFOL  N/A 09/01/2022   Procedure: ESOPHAGOGASTRODUODENOSCOPY (EGD) WITH PROPOFOL ;  Surgeon: Lajuan Pila, MD;  Location: [facility];  Service: Gastroenterology;  Laterality: N/A;   IR IMAGING GUIDED PORT INSERTION  06/29/2023   KNEE SURGERY     right, repair torn torn cartialage   PACEMAKER INSERTION  08/2014   changed last in 2023 per pt at [facility]   TONSILLECTOMY     TUBAL LIGATION      Family  History  Problem Relation Age of Onset   Diabetes Mother    Hypertension Mother    Heart disease Mother        s/p 1 stent   Hyperlipidemia Mother    Arthritis Mother    Colon cancer Father 15   Irritable bowel syndrome Sister    Hypertension Maternal Grandmother    Arthritis Maternal Grandmother    Heart disease Maternal Grandfather        MI   Hypertension Maternal Grandfather    Arthritis Maternal Grandfather    Hyperlipidemia Daughter    Hypertension Daughter    Hypertension Son    Esophageal cancer Neg Hx    Uterine cancer  Neg Hx    Pancreatic cancer Neg Hx     Social History   Socioeconomic History   Marital status: Single    Spouse name: [name]   Number of children: 3   Years of education: [name]   Highest education level: High school graduate  Occupational History   [name]  Tobacco Use   Smoking status: Never    Passive exposure: Never   Smokeless tobacco: Never  Vaping Use   Vaping status: Never Used  Substance and Sexual Activity   Alcohol use: No   Drug use: No   Sexual activity: Not Currently    Partners: Male    Birth control/protection: Post-menopausal  Other Topics Concern   [name]  Social History Narrative   Lives at home alone   Retired   Caffeine: coffee   Social Drivers of Corporate investment banker Strain: Medium Risk (07/08/2023)   Overall Financial Resource Strain (CARDIA)    Difficulty of Paying Living Expenses: Somewhat hard  Food Insecurity: Low Risk  (12/20/2023)   [facility]   Hunger Vital Sign    Worried About Running Out of Food in the Last Year: Never true    Ran Out of Food in the Last Year: Never true  Transportation Needs: No Transportation Needs (12/20/2023)   [facility]   Transportation    In the past 12 months, has lack of reliable transportation kept you from medical appointments, meetings, work or from getting things needed for daily living? : No  Physical Activity: Inactive (07/08/2023)   Exercise Vital Sign    Days of Exercise per Week: 0 days    Minutes of Exercise per Session: 0 min  Stress: No Stress Concern Present (01/21/2023)   [employer] of Occupational Health - Occupational Stress Questionnaire    Feeling of Stress : Not at all  Social Connections: Moderately Isolated (07/08/2023)   Social Connection and Isolation Panel [NHANES]    Frequency of Communication with Friends and Family: More than three times a week    Frequency of Social Gatherings with  Friends and Family: Twice a week    Attends Religious Services: More than 4 times per year    Active Member of [employer] or Organizations: No    Attends Banker Meetings: Never    Marital Status: Divorced  Catering manager Violence: Not At Risk (11/29/2023)   Humiliation, Afraid, Rape, and Kick questionnaire    Fear of Current or Ex-Partner: No    Emotionally Abused: No    Physically Abused: No    Sexually Abused: No    Outpatient Medications Prior to Visit  Medication Sig Dispense Refill   acetaminophen  (TYLENOL ) 500 MG tablet Take 500  mg by mouth every 6 (six) hours as needed.     acyclovir  (ZOVIRAX ) 400 MG tablet TAKE ONE (1) TABLET BY MOUTH TWO (2) TIMES DAILY 60 tablet 3   albuterol  (PROVENTIL ) (2.5 MG/3ML) 0.083% nebulizer solution Take 3 mLs (2.5 mg total) by nebulization every 6 (six) hours as needed for wheezing or shortness of breath. 150 mL 2   albuterol  (VENTOLIN  HFA) 108 (90 Base) MCG/ACT inhaler Inhale 2 puffs into the lungs every 6 (six) hours as needed for wheezing or shortness of breath. 18 g 5   ALPRAZolam  (XANAX ) 0.25 MG tablet Take 1-2 tablets (0.25-0.5 mg total) by mouth 3 (three) times daily as needed for anxiety. 40 tablet 1   amLODipine  (NORVASC ) 5 MG tablet Take 5 mg by mouth daily.     aspirin  EC 81 MG tablet Take 81 mg by mouth daily. Swallow whole.     budesonide  (PULMICORT ) 0.5 MG/2ML nebulizer solution Take 2 mLs (0.5 mg total) by nebulization 2 (two) times daily as needed (cough, wheezing or shortness of breath). 120 mL 11   celecoxib  (CELEBREX ) 100 MG capsule Take 100 mg by mouth 2 (two) times daily.     cetirizine  (ZYRTEC ) 10 MG tablet Take 10 mg by mouth at bedtime.     Cholecalciferol (VITAMIN D3) 50 MCG (2000 UT) TABS Take 2,000 Units by mouth in the morning.     cholestyramine  light (PREVALITE ) 4 g packet Take 4 g by mouth 2 (two) times daily.     cyclobenzaprine  (FLEXERIL ) 5 MG tablet Take 1 tablet (5 mg total) by mouth 2 (two) times daily as  needed for up to 10 doses for muscle spasms. 10 tablet 0   diphenoxylate -atropine  (LOMOTIL ) 2.5-0.025 MG tablet Take 1 tablet by mouth 2 (two) times daily. 60 tablet 6   estradiol  (ESTRACE ) 0.1 MG/GM vaginal cream Place 0.5 g vaginally 2 (two) times a week. Place 0.5g nightly for two weeks then twice a week after 30 g 3   famotidine  (PEPCID ) 20 MG tablet Take 1 tablet (20 mg total) by mouth at bedtime. 30 tablet 2   ferrous sulfate (FEROSUL) 325 (65 FE) MG tablet Take 1 tablet (325 mg total) by mouth daily with breakfast. 90 tablet 0   fluticasone  (FLONASE ) 50 MCG/ACT nasal spray Place 2 sprays into both nostrils daily. 16 g 0   furosemide  (LASIX ) 20 MG tablet Take 20 mg by mouth daily as needed (fluid retention (feet swelling)).     HYDROcodone  bit-homatropine (HYDROMET) 5-1.5 MG/5ML syrup Take 5 mLs by mouth every 8 (eight) hours as needed for cough. 120 mL 0   HYDROcodone -acetaminophen  (NORCO/VICODIN) 5-325 MG tablet Take 1 tablet by mouth every 6 (six) hours as needed for severe pain (pain score 7-10). 10 tablet 0   hydrOXYzine  (ATARAX ) 10 MG tablet TAKE ONE (1) TABLET BY MOUTH 3 TIMES DAILY AS NEEDED FOR ITCHING 30 tablet 2   hyoscyamine  (LEVSIN SL) 0.125 MG SL tablet Place 1 tablet (0.125 mg total) under the tongue every 4 (four) hours as needed. 30 tablet 1   Lactobacillus Rhamnosus, GG, ([facility]NESS) CAPS Take 1 capsule by mouth daily.     lidocaine  (XYLOCAINE ) 5 % ointment Apply 1 Application topically as needed. 35.44 g 0   lidocaine -prilocaine  (EMLA ) cream Apply 1 Application topically as needed. 30 g 0   loperamide  (IMODIUM ) 2 MG capsule Take 4 mg by mouth in the morning and at bedtime.     losartan  (COZAAR ) 50 MG tablet Take 50  mg by mouth in the morning and at bedtime.     Magnesium  Glycinate 100 MG CAPS Take 200 mg by mouth at bedtime.     meclizine  (ANTIVERT ) 12.5 MG tablet Take 12.5 mg by mouth 2 (two) times daily as needed for dizziness or nausea.     metoprolol   succinate (TOPROL -XL) 50 MG 24 hr tablet Take 1 tablet (50 mg total) by mouth 2 (two) times daily. (Patient taking differently: Take 50 mg by mouth daily.) 60 tablet 2   metroNIDAZOLE  (METROGEL ) 0.75 % gel Insert One applicatorful (5 g ) vaginally at bedtime for 5 days 45 g 0   montelukast  (SINGULAIR ) 10 MG tablet Take 1 tablet (10 mg total) by mouth at bedtime. Please start taking this on September 4. 30 tablet 3   Multiple Vitamins-Minerals (CENTRUM SILVER  WOMEN 50+ PO) Take 1 tablet by mouth daily at 6 (six) AM.     neomycin -polymyxin-hydrocortisone (CORTISPORIN) OTIC solution Place 4 drops into both ears 4 (four) times daily. 10 mL 4   nortriptyline  (PAMELOR ) 10 MG capsule Take 1 capsule (10 mg total) by mouth at bedtime. 30 capsule 1   omeprazole  (PRILOSEC) 20 MG capsule SMARTSIG:1.0 Capsule(s) By Mouth Daily     ondansetron  (ZOFRAN ) 8 MG tablet Take 1 tablet (8 mg total) by mouth every 8 (eight) hours as needed for nausea or vomiting. 30 tablet 1   oxyCODONE -acetaminophen  (PERCOCET) 5-325 MG tablet Take 2 tablets by mouth every 6 (six) hours as needed. 10 tablet 0   pantoprazole  (PROTONIX ) 40 MG tablet Take 1 tablet (40 mg total) by mouth daily. 30 tablet 3   phenazopyridine  (PYRIDIUM ) 200 MG tablet Take 1 tablet (200 mg total) by mouth 3 (three) times daily as needed for pain. 30 tablet 0   potassium chloride  SA (KLOR-CON  M) 20 MEQ tablet TAKE 1 TABLET BY MOUTH EVERY MONDAY, WEDNESDAY, AND FRIDAY. 30 tablet 5   prochlorperazine  (COMPAZINE ) 10 MG tablet Take 0.5 tablets (5 mg total) by mouth 2 (two) times daily as needed for nausea or vomiting. 60 tablet 1   Respiratory Therapy Supplies (PILLOW MASK/ADULT) MISC      Saccharomyces boulardii (FLORASTOR PO) Take 500 mg by mouth daily at 6 (six) AM.     scopolamine  (TRANSDERM-SCOP) 1 MG/3DAYS Place 1 patch (1.5 mg total) onto the skin every 3 (three) days. 10 patch 1   sertraline  (ZOLOFT ) 50 MG tablet Take 1 tablet (50 mg total) by mouth daily. 30  tablet 3   spironolactone  (ALDACTONE ) 25 MG tablet Take 25 mg by mouth daily.     Vibegron  (GEMTESA ) 75 MG TABS Take 1 tablet (75 mg total) by mouth daily. 30 tablet 2   dicyclomine  (BENTYL ) 10 MG capsule Take 1 capsule (10 mg total) by mouth 4 (four) times daily -  before meals and at bedtime. cramping 90 capsule 1   cephALEXin  (KEFLEX ) 500 MG capsule Take 1 capsule (500 mg total) by mouth 2 (two) times daily for 5 days. 10 capsule 0   Facility-Administered Medications Prior to Visit  Medication Dose Route Frequency Provider Last Rate Last Admin   acetaminophen  (TYLENOL ) tablet 650 mg  650 mg Oral Once Ennever, Peter R, MD       nitroGLYCERIN  (NITROSTAT ) SL tablet 0.4 mg  0.4 mg Sublingual Once Blyth, Stacey A, MD       sodium chloride  flush (NS) 0.9 % injection 10 mL  10 mL Intracatheter PRN Ivor Mars, MD   10 mL at 09/24/23 1640  Allergies  Allergen Reactions   Benazepril  Anaphylaxis, Swelling and Hives    angioedema Throat and lip swelling   Ondansetron  Hcl Hives    Redness and hives post IV admin on 07/05/17   Codeine Nausea And Vomiting   Morphine Hives    Redness and hives noted post IV admin on 07/05/17    ROS See HPI     Objective:     Physical Exam Constitutional:      Appearance: Normal appearance. She is obese.  HENT:     Head: Normocephalic.     Mouth/Throat:     Mouth: Mucous membranes are moist.     Pharynx: Oropharynx is clear.  Cardiovascular:     Rate and Rhythm: Normal rate and regular rhythm.     Pulses: Normal pulses.     Heart sounds: Normal heart sounds. No murmur heard.    No friction rub. No gallop.  Pulmonary:     Effort: Pulmonary effort is normal.     Breath sounds: Normal breath sounds.  Abdominal:     General: Bowel sounds are normal.     Palpations: Abdomen is soft.     Tenderness: There is no abdominal tenderness.     Comments: RUQ tenderness  Genitourinary:    Comments: Pt c/o incontinence, "leaking", worse with movement/  cough  Skin:    General: Skin is warm and dry.     Capillary Refill: Capillary refill takes less than 2 seconds.  Neurological:     Mental Status: She is alert.  Psychiatric:        Mood and Affect: Mood normal.        Behavior: Behavior normal.      BP 130/80 (BP Location: Right Arm, Patient Position: Sitting, Cuff Size: Normal)   Pulse 81   Temp 98.2 F (36.8 C) (Oral)   Resp 12   Ht 5\' 5"  (1.651 m)   Wt 196 lb (88.9 kg)   SpO2 95%   BMI 32.62 kg/m  Wt Readings from Last 3 Encounters:  03/21/24 196 lb (88.9 kg)  03/15/24 194 lb (88 kg)  03/09/24 193 lb 3.2 oz (87.6 kg)       Assessment & Plan:   Problem List Items Addressed This Visit     Dysuria   Point-of-care urinalysis negative. Awaiting urine culture results and will treat accordingly. Patient is followed by Urogynecology and has a history of chronic urinary tract infections      Relevant Orders   POCT urinalysis dipstick (Completed)   Urine Culture   Stress incontinence - Primary   Signs and symptoms are indicative of stress urinary incontinence. Patient encouraged to follow through with pelvic floor physical therapy referral per Urogynecology recommendation. She reports a GYN appointment later this week and was advised to discuss pessary options, which may help alleviate some of her symptoms      Other Visit Diagnoses       Nausea and vomiting, unspecified vomiting type       Relevant Orders   US  Abdomen Limited RUQ (LIVER/GB)           I have discontinued Devra Fontana A. Biehn's cephALEXin . I am also having her maintain her fluticasone , furosemide , losartan , cetirizine , Vitamin D3, Magnesium  Glycinate, meclizine , metoprolol  succinate, aspirin  EC, loperamide , spironolactone , hydrOXYzine , cholestyramine  light, ALPRAZolam , nortriptyline , sertraline , celecoxib , lidocaine -prilocaine , scopolamine , montelukast , Saccharomyces boulardii (FLORASTOR PO), Multiple Vitamins-Minerals (CENTRUM SILVER  WOMEN 50+ PO),  ondansetron , acetaminophen , neomycin -polymyxin-hydrocortisone, oxyCODONE -acetaminophen , pantoprazole , famotidine , HYDROcodone  bit-homatropine, amLODipine , Pillow Mask/Adult, potassium chloride  SA,  omeprazole , dicyclomine , prochlorperazine , albuterol , albuterol , budesonide , cyclobenzaprine , ferrous sulfate, diphenoxylate -atropine , hyoscyamine , estradiol , Gemtesa , phenazopyridine , lidocaine , acyclovir , metroNIDAZOLE , HYDROcodone -acetaminophen , and [facility]. We will continue to administer nitroGLYCERIN .  No orders of the defined types were placed in this encounter.

## 2024-03-21 NOTE — Patient Instructions (Addendum)
 Please discuss pessary placement option with GYN on Thursday to help relieve pelvic discomfort and assist w/ stress incontinence.

## 2024-03-21 NOTE — Assessment & Plan Note (Signed)
 Point-of-care urinalysis negative. Awaiting urine culture results and will treat accordingly. Patient is followed by Urogynecology and has a history of chronic urinary tract infections

## 2024-03-21 NOTE — Assessment & Plan Note (Signed)
 Signs and symptoms are indicative of stress urinary incontinence. Patient encouraged to follow through with pelvic floor physical therapy referral per Urogynecology recommendation. She reports a GYN appointment later this week and was advised to discuss pessary options, which may help alleviate some of her symptoms

## 2024-03-22 ENCOUNTER — Encounter: Payer: Self-pay | Admitting: Hematology & Oncology

## 2024-03-22 ENCOUNTER — Inpatient Hospital Stay

## 2024-03-22 ENCOUNTER — Inpatient Hospital Stay (HOSPITAL_BASED_OUTPATIENT_CLINIC_OR_DEPARTMENT_OTHER): Admitting: Hematology & Oncology

## 2024-03-22 ENCOUNTER — Other Ambulatory Visit: Payer: Self-pay

## 2024-03-22 VITALS — BP 134/75 | HR 83 | Temp 97.8°F | Resp 18 | Ht 65.0 in | Wt 194.0 lb

## 2024-03-22 DIAGNOSIS — C9002 Multiple myeloma in relapse: Secondary | ICD-10-CM

## 2024-03-22 DIAGNOSIS — C9000 Multiple myeloma not having achieved remission: Secondary | ICD-10-CM

## 2024-03-22 DIAGNOSIS — R269 Unspecified abnormalities of gait and mobility: Secondary | ICD-10-CM | POA: Diagnosis not present

## 2024-03-22 DIAGNOSIS — D801 Nonfamilial hypogammaglobulinemia: Secondary | ICD-10-CM

## 2024-03-22 DIAGNOSIS — R197 Diarrhea, unspecified: Secondary | ICD-10-CM | POA: Diagnosis not present

## 2024-03-22 DIAGNOSIS — R296 Repeated falls: Secondary | ICD-10-CM | POA: Diagnosis not present

## 2024-03-22 DIAGNOSIS — E860 Dehydration: Secondary | ICD-10-CM

## 2024-03-22 LAB — CBC WITH DIFFERENTIAL (CANCER CENTER ONLY)
Abs Immature Granulocytes: 0.01 10*3/uL (ref 0.00–0.07)
Basophils Absolute: 0 10*3/uL (ref 0.0–0.1)
Basophils Relative: 1 %
Eosinophils Absolute: 0.1 10*3/uL (ref 0.0–0.5)
Eosinophils Relative: 3 %
HCT: 36.1 % (ref 36.0–46.0)
Hemoglobin: 11.7 g/dL — ABNORMAL LOW (ref 12.0–15.0)
Immature Granulocytes: 0 %
Lymphocytes Relative: 26 %
Lymphs Abs: 1 10*3/uL (ref 0.7–4.0)
MCH: 29.6 pg (ref 26.0–34.0)
MCHC: 32.4 g/dL (ref 30.0–36.0)
MCV: 91.4 fL (ref 80.0–100.0)
Monocytes Absolute: 0.3 10*3/uL (ref 0.1–1.0)
Monocytes Relative: 8 %
Neutro Abs: 2.4 10*3/uL (ref 1.7–7.7)
Neutrophils Relative %: 62 %
Platelet Count: 128 10*3/uL — ABNORMAL LOW (ref 150–400)
RBC: 3.95 MIL/uL (ref 3.87–5.11)
RDW: 12.7 % (ref 11.5–15.5)
WBC Count: 3.8 10*3/uL — ABNORMAL LOW (ref 4.0–10.5)
nRBC: 0 % (ref 0.0–0.2)

## 2024-03-22 LAB — CMP (CANCER CENTER ONLY)
ALT: 18 U/L (ref 0–44)
AST: 20 U/L (ref 15–41)
Albumin: 4.3 g/dL (ref 3.5–5.0)
Alkaline Phosphatase: 45 U/L (ref 38–126)
Anion gap: 9 (ref 5–15)
BUN: 16 mg/dL (ref 8–23)
CO2: 27 mmol/L (ref 22–32)
Calcium: 9.4 mg/dL (ref 8.9–10.3)
Chloride: 105 mmol/L (ref 98–111)
Creatinine: 0.69 mg/dL (ref 0.44–1.00)
GFR, Estimated: >60 mL/min (ref >60)
Glucose, Bld: 165 mg/dL — ABNORMAL HIGH (ref 70–99)
Potassium: 3.7 mmol/L (ref 3.5–5.1)
Sodium: 141 mmol/L (ref 135–145)
Total Bilirubin: 0.5 mg/dL (ref 0.0–1.2)
Total Protein: 6.7 g/dL (ref 6.5–8.1)

## 2024-03-22 LAB — URINE CULTURE
MICRO NUMBER:: 16448848
SPECIMEN QUALITY:: ADEQUATE

## 2024-03-22 LAB — LACTATE DEHYDROGENASE: LDH: 210 U/L — ABNORMAL HIGH (ref 98–192)

## 2024-03-22 MED ORDER — SODIUM CHLORIDE 0.9% FLUSH
10.0000 mL | INTRAVENOUS | Status: DC | PRN
  Administered 2024-03-22: 10 mL via INTRAVENOUS

## 2024-03-22 MED ORDER — SODIUM CHLORIDE 0.9 % IV SOLN: INTRAVENOUS | Status: DC

## 2024-03-22 MED ORDER — HEPARIN SOD (PORK) LOCK FLUSH 100 UNIT/ML IV SOLN
500.0000 [IU] | Freq: Once | INTRAVENOUS | Status: AC
  Administered 2024-03-22: 500 [IU] via INTRAVENOUS

## 2024-03-22 MED ORDER — HEPARIN SOD (PORK) LOCK FLUSH 100 UNIT/ML IV SOLN: 500.0000 [IU] | Freq: Once | INTRAVENOUS | Status: DC

## 2024-03-22 NOTE — Patient Instructions (Signed)
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
## 2024-03-22 NOTE — Patient Instructions (Signed)

## 2024-03-23 ENCOUNTER — Ambulatory Visit: Admitting: Obstetrics and Gynecology

## 2024-03-23 ENCOUNTER — Encounter: Payer: Self-pay | Admitting: Obstetrics and Gynecology

## 2024-03-23 VITALS — BP 167/85 | HR 77 | Wt 195.0 lb

## 2024-03-23 DIAGNOSIS — R1013 Epigastric pain: Secondary | ICD-10-CM

## 2024-03-23 DIAGNOSIS — R87610 Atypical squamous cells of undetermined significance on cytologic smear of cervix (ASC-US): Secondary | ICD-10-CM | POA: Diagnosis not present

## 2024-03-23 DIAGNOSIS — E2839 Other primary ovarian failure: Secondary | ICD-10-CM

## 2024-03-23 DIAGNOSIS — Z1231 Encounter for screening mammogram for malignant neoplasm of breast: Secondary | ICD-10-CM

## 2024-03-23 DIAGNOSIS — R030 Elevated blood-pressure reading, without diagnosis of hypertension: Secondary | ICD-10-CM

## 2024-03-23 LAB — IGG, IGA, IGM
IgA: 32 mg/dL — ABNORMAL LOW (ref 87–352)
IgG (Immunoglobin G), Serum: 885 mg/dL (ref 586–1602)
IgM (Immunoglobulin M), Srm: 20 mg/dL — ABNORMAL LOW (ref 26–217)

## 2024-03-23 LAB — KAPPA/LAMBDA LIGHT CHAINS
Kappa free light chain: 7.3 mg/L (ref 3.3–19.4)
Kappa, lambda light chain ratio: 3.32 — ABNORMAL HIGH (ref 0.26–1.65)
Lambda free light chains: 2.2 mg/L — ABNORMAL LOW (ref 5.7–26.3)

## 2024-03-23 NOTE — Progress Notes (Signed)
 NEW GYNECOLOGY VISIT Chief Complaint  Patient presents with   Abdominal Pain          Subjective:  Tami [name] is a 68 y.o. [id number] who presents for abdominal pain.  Patient points to her epigastric region and describes that she has been having cramping abdominal pain associated with diarrhea. PMH significant for multiple myeloma. Doing well now per her report.  She reports a history of hysterectomy for fibroids in her 39s.  Reports she has seen GI in the past  Denies lower pelvic pain or discomfort.  Neg urine culture 5/13 CT A/P 4/24 with no adnexal lesions Has RUQ ultrasound ordered  Last pap: 2023 ASCUS/HPV neg Denies hx abnormal paps  OB History     Gravida  5   Para  4   Term  4   Preterm      AB      Living  4      SAB      IAB      Ectopic      Multiple      Live Births  5           Past Medical History:  Diagnosis Date   Abnormal SPEP 07/26/2016   Arthritis    knees, hands   Back pain 07/14/2016   Bowel obstruction (HCC) 11/2014   C. 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   Multiple myeloma (HCC) 07/20/2018   Multiple myeloma not having achieved remission (HCC) 07/20/2018   Myalgia 12/31/2016   Obstructive sleep apnea 03/31/2015   Presence of permanent cardiac pacemaker    SOB (shortness of breath) 04/09/2016   Stroke (HCC)    TIAs   UTI (urinary tract infection)     Past Surgical History:  Procedure Laterality Date   ABDOMINAL HYSTERECTOMY     menorraghia, 2006,  total   BIOPSY  09/01/2022   Procedure: BIOPSY;  Surgeon: Lajuan Pila, MD;  Location: [facility];  Service: Gastroenterology;;   CHOLECYSTECTOMY     COLONOSCOPY  2018   [facility] per patient   COLONOSCOPY WITH PROPOFOL  N/A 09/01/2022   Procedure: COLONOSCOPY WITH PROPOFOL ;  Surgeon: Lajuan Pila, MD;  Location: [facility];  Service: Gastroenterology;  Laterality: N/A;   ESOPHAGOGASTRODUODENOSCOPY  2018   [facility]    ESOPHAGOGASTRODUODENOSCOPY (EGD) WITH PROPOFOL  N/A 09/01/2022   Procedure: ESOPHAGOGASTRODUODENOSCOPY (EGD) WITH PROPOFOL ;  Surgeon: Lajuan Pila, MD;  Location: [facility];  Service: Gastroenterology;  Laterality: N/A;   IR IMAGING GUIDED PORT INSERTION  06/29/2023   KNEE SURGERY     right, repair torn torn cartialage   PACEMAKER INSERTION  08/2014   changed last in 2023 per pt at [facility]   TONSILLECTOMY     TUBAL LIGATION      Social History   Socioeconomic History   Marital status: Single    Spouse name: [name]   Number of children: 3   Years of education: [name]   Highest education  level: High school graduate  Occupational History   [name]  Tobacco Use   Smoking status: Never    Passive exposure: Never   Smokeless tobacco: Never  Vaping Use   Vaping status: Never Used  Substance and Sexual Activity   Alcohol use: No   Drug use: No   Sexual activity: Not Currently    Partners: Male    Birth control/protection: Post-menopausal  Other Topics Concern   [name]  Social History Narrative   Lives at home alone   Retired   Caffeine: coffee   Social Drivers of Corporate investment banker Strain: Medium Risk (07/08/2023)   Overall Financial Resource Strain (CARDIA)    Difficulty of Paying Living Expenses: Somewhat hard  Food Insecurity: Low Risk  (12/20/2023)   [facility]   Hunger Vital Sign    Worried About Running Out of Food in the Last Year: Never true    Ran Out  of Food in the Last Year: Never true  Transportation Needs: No Transportation Needs (12/20/2023)   [facility]   Transportation    In the past 12 months, has lack of reliable transportation kept you from medical appointments, meetings, work or from getting things needed for daily living? : No  Physical Activity: Inactive (07/08/2023)   Exercise Vital Sign    Days of Exercise per Week: 0 days    Minutes of Exercise per Session: 0 min  Stress: No Stress Concern Present (01/21/2023)   [employer] of Occupational Health - Occupational Stress Questionnaire    Feeling of Stress : Not at all  Social Connections: Moderately Isolated (07/08/2023)   Social Connection and Isolation Panel [NHANES]    Frequency of Communication with Friends and Family: More than three times a week    Frequency of Social Gatherings with Friends and Family: Twice a week    Attends Religious Services: More than 4 times per year    Active Member of [employer] or Organizations: No    Attends Engineer, structural: Never    Marital Status: Divorced    Family History  Problem Relation Age of Onset   Diabetes Mother    Hypertension Mother    Heart disease Mother        s/p 1 stent   Hyperlipidemia Mother    Arthritis Mother    Colon cancer Father 70   Irritable bowel syndrome Sister    Hypertension Maternal Grandmother    Arthritis Maternal Grandmother    Heart disease Maternal Grandfather        MI   Hypertension Maternal Grandfather    Arthritis Maternal Grandfather    Hyperlipidemia Daughter    Hypertension Daughter    Hypertension Son    Esophageal cancer Neg Hx    Uterine cancer Neg Hx    Pancreatic cancer Neg Hx     Current Outpatient Medications on File Prior to Visit  Medication Sig Dispense Refill   acetaminophen  (TYLENOL ) 500 MG tablet Take 500 mg by mouth every 6 (six) hours as needed.     acyclovir  (ZOVIRAX ) 400 MG tablet TAKE ONE (1) TABLET BY MOUTH TWO (2)  TIMES DAILY 60 tablet 3   albuterol  (PROVENTIL ) (2.5 MG/3ML) 0.083% nebulizer solution Take 3 mLs (2.5 mg total) by nebulization every 6 (six) hours as needed for wheezing or shortness of breath. 150 mL 2   albuterol  (VENTOLIN  HFA) 108 (90 Base) MCG/ACT inhaler Inhale 2 puffs  into the lungs every 6 (six) hours as needed for wheezing or shortness of breath. 18 g 5   aspirin  EC 81 MG tablet Take 81 mg by mouth daily. Swallow whole.     budesonide  (PULMICORT ) 0.5 MG/2ML nebulizer solution Take 2 mLs (0.5 mg total) by nebulization 2 (two) times daily as needed (cough, wheezing or shortness of breath). 120 mL 11   celecoxib  (CELEBREX ) 100 MG capsule Take 100 mg by mouth 2 (two) times daily.     cetirizine  (ZYRTEC ) 10 MG tablet Take 10 mg by mouth at bedtime.     Cholecalciferol (VITAMIN D3) 50 MCG (2000 UT) TABS Take 2,000 Units by mouth in the morning.     cholestyramine  light (PREVALITE ) 4 g packet Take 4 g by mouth 2 (two) times daily.     diphenoxylate -atropine  (LOMOTIL ) 2.5-0.025 MG tablet Take 1 tablet by mouth 2 (two) times daily. 60 tablet 6   estradiol  (ESTRACE ) 0.1 MG/GM vaginal cream Place 0.5 g vaginally 2 (two) times a week. Place 0.5g nightly for two weeks then twice a week after 30 g 3   ferrous sulfate (FEROSUL) 325 (65 FE) MG tablet Take 1 tablet (325 mg total) by mouth daily with breakfast. 90 tablet 0   fluticasone  (FLONASE ) 50 MCG/ACT nasal spray Place 2 sprays into both nostrils daily. 16 g 0   furosemide  (LASIX ) 20 MG tablet Take 20 mg by mouth daily as needed (fluid retention (feet swelling)).     HYDROcodone -acetaminophen  (NORCO/VICODIN) 5-325 MG tablet Take 1 tablet by mouth every 6 (six) hours as needed for severe pain (pain score 7-10). 10 tablet 0   hydrOXYzine  (ATARAX ) 10 MG tablet TAKE ONE (1) TABLET BY MOUTH 3 TIMES DAILY AS NEEDED FOR ITCHING 30 tablet 2   Lactobacillus Rhamnosus, GG, (CULTURELLE HEALTH & WELLNESS) CAPS Take 1 capsule by mouth daily.     lidocaine   (XYLOCAINE ) 5 % ointment Apply 1 Application topically as needed. 35.44 g 0   lidocaine -prilocaine  (EMLA ) cream Apply 1 Application topically as needed. 30 g 0   loperamide  (IMODIUM ) 2 MG capsule Take 4 mg by mouth in the morning and at bedtime.     losartan  (COZAAR ) 50 MG tablet Take 50 mg by mouth in the morning and at bedtime.     Magnesium  Glycinate 100 MG CAPS Take 200 mg by mouth at bedtime.     metoprolol  succinate (TOPROL -XL) 50 MG 24 hr tablet Take 1 tablet (50 mg total) by mouth 2 (two) times daily. (Patient taking differently: Take 50 mg by mouth daily.) 60 tablet 2   metroNIDAZOLE  (METROGEL ) 0.75 % gel Insert One applicatorful (5 g ) vaginally at bedtime for 5 days 45 g 0   montelukast  (SINGULAIR ) 10 MG tablet Take 1 tablet (10 mg total) by mouth at bedtime. Please start taking this on September 4. 30 tablet 3   Multiple Vitamins-Minerals (CENTRUM SILVER  WOMEN 50+ PO) Take 1 tablet by mouth daily at 6 (six) AM.     nortriptyline  (PAMELOR ) 10 MG capsule Take 1 capsule (10 mg total) by mouth at bedtime. 30 capsule 1   ondansetron  (ZOFRAN ) 8 MG tablet Take 1 tablet (8 mg total) by mouth every 8 (eight) hours as needed for nausea or vomiting. 30 tablet 1   oxyCODONE -acetaminophen  (PERCOCET) 5-325 MG tablet Take 2 tablets by mouth every 6 (six) hours as needed. 10 tablet 0   pantoprazole  (PROTONIX ) 40 MG tablet Take 1 tablet (40 mg total) by mouth daily. 30 tablet 3  potassium chloride  SA (KLOR-CON  M) 20 MEQ tablet TAKE 1 TABLET BY MOUTH EVERY MONDAY, WEDNESDAY, AND FRIDAY. 30 tablet 5   prochlorperazine  (COMPAZINE ) 10 MG tablet Take 0.5 tablets (5 mg total) by mouth 2 (two) times daily as needed for nausea or vomiting. 60 tablet 1   spironolactone  (ALDACTONE ) 25 MG tablet Take 25 mg by mouth daily.     Vibegron  (GEMTESA ) 75 MG TABS Take 1 tablet (75 mg total) by mouth daily. 30 tablet 2   ALPRAZolam  (XANAX ) 0.25 MG tablet Take 1-2 tablets (0.25-0.5 mg total) by mouth 3 (three) times daily  as needed for anxiety. (Patient not taking: Reported on 03/23/2024) 40 tablet 1   amLODipine  (NORVASC ) 5 MG tablet Take 5 mg by mouth daily. (Patient not taking: Reported on 03/23/2024)     cyclobenzaprine  (FLEXERIL ) 5 MG tablet Take 1 tablet (5 mg total) by mouth 2 (two) times daily as needed for up to 10 doses for muscle spasms. (Patient not taking: Reported on 03/23/2024) 10 tablet 0   dicyclomine  (BENTYL ) 10 MG capsule Take 1 capsule (10 mg total) by mouth 4 (four) times daily -  before meals and at bedtime. cramping 90 capsule 1   famotidine  (PEPCID ) 20 MG tablet Take 1 tablet (20 mg total) by mouth at bedtime. (Patient not taking: Reported on 03/23/2024) 30 tablet 2   HYDROcodone  bit-homatropine (HYDROMET) 5-1.5 MG/5ML syrup Take 5 mLs by mouth every 8 (eight) hours as needed for cough. (Patient not taking: Reported on 03/23/2024) 120 mL 0   hyoscyamine  (LEVSIN SL) 0.125 MG SL tablet Place 1 tablet (0.125 mg total) under the tongue every 4 (four) hours as needed. (Patient not taking: Reported on 03/23/2024) 30 tablet 1   meclizine  (ANTIVERT ) 12.5 MG tablet Take 12.5 mg by mouth 2 (two) times daily as needed for dizziness or nausea. (Patient not taking: Reported on 03/22/2024)     neomycin -polymyxin-hydrocortisone (CORTISPORIN) OTIC solution Place 4 drops into both ears 4 (four) times daily. (Patient not taking: Reported on 03/23/2024) 10 mL 4   phenazopyridine  (PYRIDIUM ) 200 MG tablet Take 1 tablet (200 mg total) by mouth 3 (three) times daily as needed for pain. (Patient not taking: Reported on 03/23/2024) 30 tablet 0   Respiratory Therapy Supplies (PILLOW MASK/ADULT) MISC  (Patient not taking: Reported on 03/23/2024)     scopolamine  (TRANSDERM-SCOP) 1 MG/3DAYS Place 1 patch (1.5 mg total) onto the skin every 3 (three) days. (Patient not taking: Reported on 03/23/2024) 10 patch 1   sertraline  (ZOLOFT ) 50 MG tablet Take 1 tablet (50 mg total) by mouth daily. (Patient not taking: Reported on 03/23/2024) 30  tablet 3   Current Facility-Administered Medications on File Prior to Visit  Medication Dose Route Frequency Provider Last Rate Last Admin   acetaminophen  (TYLENOL ) tablet 650 mg  650 mg Oral Once Ennever, Peter R, MD       nitroGLYCERIN  (NITROSTAT ) SL tablet 0.4 mg  0.4 mg Sublingual Once Blyth, Stacey A, MD       sodium chloride  flush (NS) 0.9 % injection 10 mL  10 mL Intracatheter PRN Ivor Mars, MD   10 mL at 09/24/23 1640    Allergies  Allergen Reactions   Benazepril  Anaphylaxis, Swelling and Hives    angioedema Throat and lip swelling   Ondansetron  Hcl Hives    Redness and hives post IV admin on 07/05/17   Codeine Nausea And Vomiting   Morphine Hives    Redness and hives noted post IV admin on 07/05/17  Objective:   Vitals:   03/23/24 0819  BP: (!) 169/109  Pulse: 82  Weight: 195 lb (88.5 kg)   Physical Examination:   General appearance - well appearing, and in no distress  Mental status - alert, oriented to person, place, and time  Psych:  normal mood and affect  Skin - warm and dry, normal color, no suspicious lesions noted  Abdomen - soft, nontender, nondistended, no masses or organomegaly  Pelvic -  VULVA: normal appearing vulva with no masses, tenderness or lesions   VAGINA: normal appearing vagina with normal color and discharge, no lesions   CERVIX: surgically absent  UTERUS: surgically absent  ADNEXA: No adnexal masses, generalized discomfort without point tenderness  Extremities:  No swelling or varicosities noted  Chaperone present for exam  Assessment and Plan:  1. Epigastric abdominal pain (Primary) Pain does not appear to be pelvic in origin. She had normal imaging of her adnexa noted on CT A/P 02/2024. She has a RUQ ultrasound planned. Recommend further follow up with her PCP or GI  2. Atypical squamous cell changes of undetermined significance (ASCUS) on cervical cytology with negative high risk human papilloma virus (HPV) test  result Reviewed with patient that her next pap is due 2026  3. Encounter for screening mammogram for malignant neoplasm of breast Due for mammo - MM 3D SCREENING MAMMOGRAM BILATERAL BREAST; Future  4. Estrogen deficiency Due for bone density - DG Bone Density; Future  5. Elevated blood pressure reading She has not taken her BP medication yet today. Advised to take it and if still elevated go to urgent care   No follow-ups on file.  Future Appointments  Date Time Provider Department Center  03/30/2024  9:30 AM Lang Pipes, PT [facility] None  04/13/2024  8:45 AM Speaks, Rosi Converse, PT [facility]  04/18/2024  4:20 PM Jackqueline Mason, NP [facility]  05/01/2024 10:40 AM Darlene Ehlers, MD [facility]  06/07/2024  8:45 AM Liane Redman, MD [facility]    Marci Setter, MD, FACOG Obstetrician & Gynecologist, [facility] for [facility], [facility]

## 2024-03-27 LAB — PROTEIN ELECTROPHORESIS, SERUM
A/G Ratio: 1.3 (ref 0.7–1.7)
Albumin ELP: 3.6 g/dL (ref 2.9–4.4)
Alpha-1-Globulin: 0.2 g/dL (ref 0.0–0.4)
Alpha-2-Globulin: 0.8 g/dL (ref 0.4–1.0)
Beta Globulin: 1 g/dL (ref 0.7–1.3)
Gamma Globulin: 0.7 g/dL (ref 0.4–1.8)
Globulin, Total: 2.7 g/dL (ref 2.2–3.9)
M-Spike, %: 0.2 g/dL — ABNORMAL HIGH
Total Protein ELP: 6.3 g/dL (ref 6.0–8.5)

## 2024-03-30 ENCOUNTER — Ambulatory Visit: Attending: Obstetrics | Admitting: Physical Therapy

## 2024-03-30 ENCOUNTER — Other Ambulatory Visit: Payer: Self-pay

## 2024-03-30 DIAGNOSIS — R279 Unspecified lack of coordination: Secondary | ICD-10-CM | POA: Insufficient documentation

## 2024-03-30 DIAGNOSIS — M6281 Muscle weakness (generalized): Secondary | ICD-10-CM | POA: Diagnosis not present

## 2024-03-30 DIAGNOSIS — M62838 Other muscle spasm: Secondary | ICD-10-CM | POA: Diagnosis not present

## 2024-03-30 DIAGNOSIS — R293 Abnormal posture: Secondary | ICD-10-CM | POA: Diagnosis not present

## 2024-03-30 NOTE — Patient Instructions (Addendum)
 Types of Fiber  There are two main types of fiber:  insoluble and soluble.  Both of these types can prevent and relieve constipation and diarrhea, although some people find one or the other to be more easily digested.  This handout details information about both types of fiber. recommended 25-35 grams of fiber per day,  average 9-12 grams per meal   key is a balance between soluble and insoluble  Insoluble Fiber        Functions of Insoluble Fiber moves bulk through the intestines  controls and balances the pH (acidity) in the intestines   This type of fiber should be avoided or reduced if you have soft, frequent bowel movements or leakage      Benefits of Insoluble Fiber promotes regular bowel movement and prevents constipation  removes fecal waste through colon in less time  keeps an optimal pH in intestines to prevent microbes from producing cancer substances, therefore preventing colon cancer        Food Sources of Insoluble Fiber whole-wheat products  wheat bran "miller's bran" corn bran  flax seed or other seeds vegetables such as green beans, broccoli, cauliflower and potato skins  fruit skins and root vegetable skins  popcorn brown rice  Soluble Fiber( Types 5,6,7- soft/liquid stools)       Functions of Soluble Fiber  holds water in the colon to bulk and soften the stool prolongs stomach emptying time so that sugar is released and absorbed more slowly  prevent leakage associated with soft, frequent bowel movements.        Benefits of Soluble Fiber lowers total cholesterol and LDL cholesterol (the bad cholesterol) therefore reducing the risk of heart disease  regulates blood sugar for people with diabetes       Food Sources of Soluble Fiber oat/oat bran dried beans and peas  nuts  barley  flax seed or other seeds fruits such as oranges, pears, peaches, and apples  vegetables such as carrots  psyllium husk  prunes  Moisturizers They are used in the vagina to  hydrate the mucous membrane that make up the vaginal canal. Designed to keep a more normal acid balance (ph) Once placed in the vagina, it will last between two to three days.  Use 2-3 times per week at bedtime  Ingredients to avoid is glycerin and fragrance, can increase chance of infection Should not be used just before sex due to causing irritation Most are gels administered either in a tampon-shaped applicator or as a vaginal suppository. They are non-hormonal.   Types of Moisturizers(internal use)  Vitamin E vaginal suppositories- Whole foods, Amazon Moist Again Coconut oil- can break down condoms, any grocery store (prefer organic) Julva- (Do no use if taking  Tamoxifen) amazon Yes moisturizer- amazon NeuEve Silk , NeuEve Silver  for menopausal or over 65 (if have severe vaginal atrophy or cancer treatments use NeuEve Silk for  1 month than move to [employer] )- [employer], [place].com Olive and Bee intimate cream- www.oliveandbee.com.au Mae vaginal moisturizer- Amazon Aloe Good Clean Love Hyaluronic acid Hyalofemme Reveree hyaluronic acid inserts   Creams to use externally on the Vulva area Marathon Oil (good for for cancer patients that had radiation to the area)- [place] or [employer].https://garcia-valdez.org/ Vulva Balm/ V-magic cream by medicine mama- amazon Julva-amazon Vital "V Wild Yam salve ( help moisturize and help with thinning vulvar area, does have Beeswax [employer] Pro-Meno Wild Yam Cream- Amazon Desert Harvest Gele Cleo by Derenda Flax labial moisturizer (Amazon),  Coconut or olive  oil aloe Good Clean Love Enchanted Rose by intimate rose  Things to avoid in the vaginal area Do not use things to irritate the vulvar area No lotions just specialized creams for the vulva area- Neogyn, V-magic,  No soaps; can use Aveeno or Calendula cleanser, unscented Dove if needed. Must be gentle No deodorants No douches Good to sleep without underwear to let the vaginal area  to air out No scrubbing: spread the lips to let warm water rinse over labias and pat dry   Bladder Irritants  Certain foods and beverages can be irritating to the bladder.  Avoiding these irritants may decrease your symptoms of urinary urgency, frequency or bladder pain.  Even reducing your intake can help with your symptoms.  Not everyone is sensitive to all bladder irritants, so you may consider focusing on one irritant at a time, removing or reducing your intake of that irritant for 7-10 days to see if this change helps your symptoms.  Water intake is also very important.  Below is a list of bladder irritants.  Drinks: alcohol, carbonated beverages, caffeinated beverages such as coffee and tea, drinks with artificial sweeteners, citrus juices, apple juice, tomato juice  Foods: tomatoes and tomato based foods, spicy food, sugar and artificial sweeteners, vinegar, chocolate, raw onion, apples, citrus fruits, pineapple, cranberries, tomatoes, strawberries, plums, peaches, cantaloupe  Other: acidic urine (too concentrated) - see water intake info below  Substitutes you can try that are NOT irritating to the bladder: cooked onion, pears, papayas, sun-brewed decaf teas, watermelons, non-citrus herbal teas, apricots, kava and low-acid instant drinks (Postum).    WATER INTAKE: Remember to drink lots of water (aim for fluid intake of half your body weight with 2/3 of fluids being water).  You may be limiting fluids due to fear of leakage, but this can actually worsen urgency symptoms due to highly concentrated urine.  Water helps balance the pH of your urine so it doesn't become too acidic - acidic urine is a bladder irritant!
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
## 2024-04-09 DIAGNOSIS — G4733 Obstructive sleep apnea (adult) (pediatric): Secondary | ICD-10-CM | POA: Diagnosis not present

## 2024-04-12 ENCOUNTER — Telehealth: Payer: Self-pay

## 2024-04-12 ENCOUNTER — Other Ambulatory Visit: Payer: Self-pay

## 2024-04-12 ENCOUNTER — Telehealth (HOSPITAL_BASED_OUTPATIENT_CLINIC_OR_DEPARTMENT_OTHER): Payer: Self-pay

## 2024-04-12 DIAGNOSIS — E860 Dehydration: Secondary | ICD-10-CM

## 2024-04-12 DIAGNOSIS — C9002 Multiple myeloma in relapse: Secondary | ICD-10-CM

## 2024-04-12 NOTE — Telephone Encounter (Signed)
 Received call from patient stating that she has felt very weak and "off" the past few days and wondering if it was due to her magnesium  or potassium level being low.  Reviewed pt complaints with Dr. Maria Shiner who recommends patient come in for labs and one liter of fluid. Pt scheduled for 04/13/2024. Reviewed pt appointment and time.  Lab orders placed and fluid order placed in signed and held.  Pt appreciative of call and assistance.

## 2024-04-13 ENCOUNTER — Inpatient Hospital Stay

## 2024-04-13 ENCOUNTER — Ambulatory Visit

## 2024-04-13 ENCOUNTER — Inpatient Hospital Stay: Attending: Hematology & Oncology

## 2024-04-13 VITALS — BP 140/70 | HR 75 | Temp 98.2°F | Resp 18

## 2024-04-13 DIAGNOSIS — C9002 Multiple myeloma in relapse: Secondary | ICD-10-CM

## 2024-04-13 DIAGNOSIS — E612 Magnesium deficiency: Secondary | ICD-10-CM

## 2024-04-13 DIAGNOSIS — E860 Dehydration: Secondary | ICD-10-CM

## 2024-04-13 DIAGNOSIS — C9000 Multiple myeloma not having achieved remission: Secondary | ICD-10-CM | POA: Insufficient documentation

## 2024-04-13 LAB — CBC WITH DIFFERENTIAL (CANCER CENTER ONLY)
Abs Immature Granulocytes: 0.01 10*3/uL (ref 0.00–0.07)
Basophils Absolute: 0 10*3/uL (ref 0.0–0.1)
Basophils Relative: 1 %
Eosinophils Absolute: 0.1 10*3/uL (ref 0.0–0.5)
Eosinophils Relative: 3 %
HCT: 36.5 % (ref 36.0–46.0)
Hemoglobin: 11.8 g/dL — ABNORMAL LOW (ref 12.0–15.0)
Immature Granulocytes: 0 %
Lymphocytes Relative: 28 %
Lymphs Abs: 1.2 10*3/uL (ref 0.7–4.0)
MCH: 29.6 pg (ref 26.0–34.0)
MCHC: 32.3 g/dL (ref 30.0–36.0)
MCV: 91.5 fL (ref 80.0–100.0)
Monocytes Absolute: 0.3 10*3/uL (ref 0.1–1.0)
Monocytes Relative: 8 %
Neutro Abs: 2.6 10*3/uL (ref 1.7–7.7)
Neutrophils Relative %: 60 %
Platelet Count: 129 10*3/uL — ABNORMAL LOW (ref 150–400)
RBC: 3.99 MIL/uL (ref 3.87–5.11)
RDW: 12.4 % (ref 11.5–15.5)
WBC Count: 4.3 10*3/uL (ref 4.0–10.5)
nRBC: 0 % (ref 0.0–0.2)

## 2024-04-13 LAB — CMP (CANCER CENTER ONLY)
ALT: 15 U/L (ref 0–44)
AST: 16 U/L (ref 15–41)
Albumin: 4.2 g/dL (ref 3.5–5.0)
Alkaline Phosphatase: 50 U/L (ref 38–126)
Anion gap: 8 (ref 5–15)
BUN: 18 mg/dL (ref 8–23)
CO2: 26 mmol/L (ref 22–32)
Calcium: 9.2 mg/dL (ref 8.9–10.3)
Chloride: 107 mmol/L (ref 98–111)
Creatinine: 0.66 mg/dL (ref 0.44–1.00)
GFR, Estimated: >60 mL/min (ref >60)
Glucose, Bld: 123 mg/dL — ABNORMAL HIGH (ref 70–99)
Potassium: 3.7 mmol/L (ref 3.5–5.1)
Sodium: 141 mmol/L (ref 135–145)
Total Bilirubin: 0.5 mg/dL (ref 0.0–1.2)
Total Protein: 6.7 g/dL (ref 6.5–8.1)

## 2024-04-13 LAB — MAGNESIUM: Magnesium: 1.8 mg/dL (ref 1.7–2.4)

## 2024-04-13 MED ORDER — SODIUM CHLORIDE 0.9% FLUSH
10.0000 mL | Freq: Once | INTRAVENOUS | Status: AC
  Administered 2024-04-13: 10 mL via INTRAVENOUS

## 2024-04-13 MED ORDER — MAGNESIUM SULFATE 50 % IJ SOLN: 1.0000 g | Freq: Once | INTRAVENOUS | Status: DC

## 2024-04-13 MED ORDER — HEPARIN SOD (PORK) LOCK FLUSH 100 UNIT/ML IV SOLN
500.0000 [IU] | Freq: Once | INTRAVENOUS | Status: AC
  Administered 2024-04-13: 500 [IU] via INTRAVENOUS

## 2024-04-13 MED ORDER — SODIUM CHLORIDE 0.9 % IV SOLN: Freq: Once | INTRAVENOUS | Status: AC

## 2024-04-13 MED ORDER — MAGNESIUM SULFATE 2 GM/50ML IV SOLN
2.0000 g | Freq: Once | INTRAVENOUS | Status: AC
  Administered 2024-04-13: 2 g via INTRAVENOUS
  Filled 2024-04-13: qty 50

## 2024-04-13 NOTE — Patient Instructions (Signed)

## 2024-04-13 NOTE — Progress Notes (Signed)
 Magnesium  2gm over 1 hour ok per MD.  Elodia Hailstone, RPH, BCPS, BCOP 04/13/2024 9:40 AM

## 2024-04-13 NOTE — Patient Instructions (Signed)

## 2024-04-17 ENCOUNTER — Ambulatory Visit: Admitting: Family Medicine

## 2024-04-18 ENCOUNTER — Ambulatory Visit: Payer: Self-pay

## 2024-04-18 ENCOUNTER — Telehealth: Payer: Self-pay

## 2024-04-18 ENCOUNTER — Ambulatory Visit: Admitting: Student

## 2024-04-18 DIAGNOSIS — R109 Unspecified abdominal pain: Secondary | ICD-10-CM | POA: Diagnosis not present

## 2024-04-18 DIAGNOSIS — R197 Diarrhea, unspecified: Secondary | ICD-10-CM

## 2024-04-18 DIAGNOSIS — R519 Headache, unspecified: Secondary | ICD-10-CM | POA: Diagnosis not present

## 2024-04-18 NOTE — Telephone Encounter (Signed)
 Pt called stating she believes she has C-diff infection again, pt states she is weak, has a headache and back ache. Pt is requesting a "stool kit" be ordered. Advised pt a message would be sent back to PCP. Please see CRM regarding pt.

## 2024-04-18 NOTE — Telephone Encounter (Signed)
 FYI Only or Action Required?: Action required by provider Place order for stool test  Pt thinks she has c-diff.  Patient was last seen in primary care on 03/21/2024 by Jackqueline Mason, NP. Called Nurse Triage reporting Diarrhea. Symptoms began a week ago. Interventions attempted: drinking extra waterOther: drinking water. Symptoms are: gradually worsening.  Triage Disposition: See HCP Within 4 Hours (Or PCP Triage)  Patient/caregiver understands and will follow disposition?: no       Copied from CRM [id number]. Topic: Clinical - Red Word Triage >> Apr 18, 2024  4:35 PM Chrystal Crape R wrote: Diarrhea, nausea, head ache and stomach ache. 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 Reason for Disposition  [1] SEVERE diarrhea (e.g., 7 or more times / day more than normal) AND [2] age > 60 years  Answer Assessment - Initial Assessment Questions 1. DIARRHEA SEVERITY: "How bad is the diarrhea?" "How many more stools have you had in the past 24 hours than normal?"    - NO DIARRHEA (SCALE 0)   - MILD (SCALE 1-3): Few loose or mushy BMs; increase of 1-3 stools over normal daily number of stools; mild increase in ostomy output.   -  MODERATE (SCALE 4-7): Increase of 4-6 stools daily over normal; moderate increase in ostomy output.   -  SEVERE (SCALE 8-10; OR "WORST POSSIBLE"): Increase of 7 or more stools daily over normal; moderate increase in ostomy output; incontinence.     moderate 2. ONSET: "When did the diarrhea begin?"      Over 1 week 3. BM CONSISTENCY: "How loose or watery is the diarrhea?"      runny 4. VOMITING: "Are you also vomiting?" If Yes, ask: "How many times in the past 24 hours?"      no 5. ABDOMEN PAIN: "Are you having any abdomen pain?" If Yes, ask: "What does it feel like?" (e.g., crampy, dull, intermittent, constant)      yes 6. ABDOMEN PAIN SEVERITY: If present, ask: "How bad is the pain?"  (e.g., Scale 1-10; mild,  moderate, or severe)   - MILD (1-3): doesn't interfere with normal activities, abdomen soft and not tender to touch    - MODERATE (4-7): interferes with normal activities or awakens from sleep, abdomen tender to touch    - SEVERE (8-10): excruciating pain, doubled over, unable to do any normal activities       Moderate - severe 7. ORAL INTAKE: If vomiting, "Have you been able to drink liquids?" "How much liquids have you had in the past 24 hours?"     Drinking water 8. HYDRATION: "Any signs of dehydration?" (e.g., dry mouth [not just dry lips], too weak to stand, dizziness, new weight loss) "When did you last urinate?"     yes 9. EXPOSURE: "Have you traveled to a foreign country recently?" "Have you been exposed to anyone with diarrhea?" "Could you have eaten any food that was spoiled?"     no 10. ANTIBIOTIC USE: "Are you taking antibiotics now or have you taken antibiotics in the past 2 months?"       no 11. OTHER SYMPTOMS: "Do you have any other symptoms?" (e.g., fever, blood in stool)       BP 166/103 pulse 107, HA, Back ache, balance  Protocols used: Diarrhea-A-AH

## 2024-04-19 ENCOUNTER — Ambulatory Visit: Admitting: Student

## 2024-04-19 ENCOUNTER — Ambulatory Visit: Payer: 59 | Admitting: Obstetrics

## 2024-04-19 NOTE — Telephone Encounter (Signed)
 Patient returned call and was advised of Dr. Rodrick Clapper messages. Patient stated that she will wait until her appointment on Friday, 04/21/24 but will come in to pick up the c diff kit to complete before her Friday appointment.

## 2024-04-19 NOTE — Addendum Note (Signed)
 Addended by: Marceline Napierala C on: 04/19/2024 09:49 AM   Modules accepted: Orders

## 2024-04-19 NOTE — Telephone Encounter (Signed)
 Called patient and no answer left vm to return call. Also patient is scheduled w/ Melville Stade, NP on 04/21/24.

## 2024-04-20 ENCOUNTER — Ambulatory Visit (HOSPITAL_BASED_OUTPATIENT_CLINIC_OR_DEPARTMENT_OTHER): Admission: RE | Admit: 2024-04-20 | Source: Ambulatory Visit

## 2024-04-21 ENCOUNTER — Encounter: Payer: Self-pay | Admitting: Hematology & Oncology

## 2024-04-21 ENCOUNTER — Inpatient Hospital Stay

## 2024-04-21 ENCOUNTER — Inpatient Hospital Stay (HOSPITAL_BASED_OUTPATIENT_CLINIC_OR_DEPARTMENT_OTHER): Admitting: Hematology & Oncology

## 2024-04-21 ENCOUNTER — Encounter: Payer: Self-pay | Admitting: Student

## 2024-04-21 ENCOUNTER — Ambulatory Visit (INDEPENDENT_AMBULATORY_CARE_PROVIDER_SITE_OTHER): Admitting: Student

## 2024-04-21 VITALS — BP 143/81 | HR 79 | Temp 98.0°F | Resp 20 | Ht 65.0 in | Wt 194.7 lb

## 2024-04-21 VITALS — BP 126/80 | HR 76 | Temp 98.0°F | Resp 12 | Ht 65.0 in | Wt 194.4 lb

## 2024-04-21 DIAGNOSIS — I10 Essential (primary) hypertension: Secondary | ICD-10-CM

## 2024-04-21 DIAGNOSIS — C9000 Multiple myeloma not having achieved remission: Secondary | ICD-10-CM | POA: Diagnosis not present

## 2024-04-21 DIAGNOSIS — D801 Nonfamilial hypogammaglobulinemia: Secondary | ICD-10-CM

## 2024-04-21 DIAGNOSIS — K529 Noninfective gastroenteritis and colitis, unspecified: Secondary | ICD-10-CM | POA: Diagnosis not present

## 2024-04-21 DIAGNOSIS — R197 Diarrhea, unspecified: Secondary | ICD-10-CM

## 2024-04-21 LAB — CMP (CANCER CENTER ONLY)
ALT: 15 U/L (ref 0–44)
AST: 17 U/L (ref 15–41)
Albumin: 4.2 g/dL (ref 3.5–5.0)
Alkaline Phosphatase: 44 U/L (ref 38–126)
Anion gap: 7 (ref 5–15)
BUN: 19 mg/dL (ref 8–23)
CO2: 26 mmol/L (ref 22–32)
Calcium: 9.3 mg/dL (ref 8.9–10.3)
Chloride: 108 mmol/L (ref 98–111)
Creatinine: 0.75 mg/dL (ref 0.44–1.00)
GFR, Estimated: >60 mL/min (ref >60)
Glucose, Bld: 100 mg/dL — ABNORMAL HIGH (ref 70–99)
Potassium: 3.9 mmol/L (ref 3.5–5.1)
Sodium: 141 mmol/L (ref 135–145)
Total Bilirubin: 0.5 mg/dL (ref 0.0–1.2)
Total Protein: 6.7 g/dL (ref 6.5–8.1)

## 2024-04-21 LAB — LACTATE DEHYDROGENASE: LDH: 200 U/L — ABNORMAL HIGH (ref 98–192)

## 2024-04-21 LAB — CBC WITH DIFFERENTIAL (CANCER CENTER ONLY)
Abs Immature Granulocytes: 0.01 10*3/uL (ref 0.00–0.07)
Basophils Absolute: 0 10*3/uL (ref 0.0–0.1)
Basophils Relative: 1 %
Eosinophils Absolute: 0.1 10*3/uL (ref 0.0–0.5)
Eosinophils Relative: 2 %
HCT: 36 % (ref 36.0–46.0)
Hemoglobin: 11.7 g/dL — ABNORMAL LOW (ref 12.0–15.0)
Immature Granulocytes: 0 %
Lymphocytes Relative: 27 %
Lymphs Abs: 1 10*3/uL (ref 0.7–4.0)
MCH: 29.5 pg (ref 26.0–34.0)
MCHC: 32.5 g/dL (ref 30.0–36.0)
MCV: 90.9 fL (ref 80.0–100.0)
Monocytes Absolute: 0.3 10*3/uL (ref 0.1–1.0)
Monocytes Relative: 9 %
Neutro Abs: 2.3 10*3/uL (ref 1.7–7.7)
Neutrophils Relative %: 61 %
Platelet Count: 151 10*3/uL (ref 150–400)
RBC: 3.96 MIL/uL (ref 3.87–5.11)
RDW: 12.5 % (ref 11.5–15.5)
WBC Count: 3.8 10*3/uL — ABNORMAL LOW (ref 4.0–10.5)
nRBC: 0 % (ref 0.0–0.2)

## 2024-04-21 LAB — MAGNESIUM: Magnesium: 1.9 mg/dL (ref 1.7–2.4)

## 2024-04-21 MED ORDER — SODIUM CHLORIDE 0.9% FLUSH
10.0000 mL | Freq: Once | INTRAVENOUS | Status: AC
  Administered 2024-04-21: 10 mL via INTRAVENOUS

## 2024-04-21 MED ORDER — HEPARIN SOD (PORK) LOCK FLUSH 100 UNIT/ML IV SOLN
500.0000 [IU] | Freq: Once | INTRAVENOUS | Status: AC
  Administered 2024-04-21: 500 [IU] via INTRAVENOUS

## 2024-04-21 MED ORDER — SODIUM CHLORIDE 0.9% FLUSH
10.0000 mL | INTRAVENOUS | Status: DC | PRN
  Administered 2024-04-21: 10 mL via INTRAVENOUS

## 2024-04-21 NOTE — Assessment & Plan Note (Signed)
 Follows with oncology

## 2024-04-21 NOTE — Addendum Note (Signed)
 Addended by: Marylou Sobers D on: 04/21/2024 10:39 AM   Modules accepted: Orders

## 2024-04-21 NOTE — Assessment & Plan Note (Signed)
 Well controlled, no changes to meds. Encouraged heart healthy diet such as the DASH diet and exercise as tolerated.

## 2024-04-21 NOTE — Progress Notes (Signed)
 Subjective:     [name], female    DOB: Dec 26, 1955, 68 y.o.   MRN: [id number]  Chief Complaint  Patient presents with   Follow-up    Abdominal pain     HPI Tami [name] is a 68yo F presents for follow up on chronic conditions.   Followed by cardiology, urogyn, infectious disease, hematology/oncology, GI  Chronic Diarrhea  She continues to experience intermittent diarrhea and has required fluid replacement. Bowel movements are now typically 3-4/ soft stools per day, though she reports increased over the past few days, denies recent antibiotics and stool incontinence. She reports taking Imodium  twice daily, and dycylomine (Bentyl ) 10 mg twice daily, and Pantoprazole  40 mg daily. Bowel movements usually occur after meals. +abdominal cramping. Denies loss of appetite and making efforts to maintain hydration and electrolyte balance.   Seen by GI February 2025 GI Plan: 12/2023 1.) Continue omeprazole  20mg  every day 2.) Obtain mesenteric duplex ultrasound 3.) Will try nortriptyline  25mg  at bedtime. Continue Bentyl  20mg  QID and Lomotil  (2) QID prn.  4.) Will follow up via portal with ultrasound result   Hypomagnesia She continues takes magnesium  glycinate 200 mg daily spilt in AM and HS per Heme/Onc. Pt is also receiving magnesium  IVPB infusions. Magnesium  levels have been stable.  Multiple Myeloma Patient has not had chemotherapy treatments since January per oncology.  Hypertension- followed by cardiology [facility] Amlodipine  5 mg daily Losartan  (Cozaar ) 50 mg twice daily Metoprolol  50 mg twice daily Spirolactone 25 mg daily  Compliant with medications  Patient denies fever, chills, SOB, CP, palpitations, dyspnea, edema, HA, vision changes, N/V, urinary symptoms, rash, weight changes, and recent illness or hospitalizations.    BP Readings from Last 1 Encounters:  04/21/24 126/80    History of Present Illness              Health Maintenance Due   Topic Date Due   MAMMOGRAM  04/03/2023   DEXA SCAN  11/07/2023    Past Medical History:  Diagnosis Date   Abnormal SPEP 07/26/2016   Arthritis    knees, hands   Back pain 07/14/2016   Bowel obstruction (HCC) 11/2014   C. 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   Multiple myeloma (HCC) 07/20/2018   Multiple myeloma not having achieved remission (HCC) 07/20/2018   Myalgia 12/31/2016   Obstructive sleep apnea 03/31/2015   Presence of permanent cardiac pacemaker    SOB (shortness of breath) 04/09/2016   Stroke (HCC)    TIAs   UTI (urinary tract infection)     Past Surgical History:  Procedure Laterality Date   ABDOMINAL HYSTERECTOMY     menorraghia, 2006, total   BIOPSY  09/01/2022   Procedure: BIOPSY;  Surgeon: Lajuan Pila, MD;  Location: [facility];  Service: Gastroenterology;;   CHOLECYSTECTOMY     COLONOSCOPY  2018   [facility] per patient   COLONOSCOPY WITH PROPOFOL  N/A 09/01/2022   Procedure: COLONOSCOPY WITH PROPOFOL ;  Surgeon: Lajuan Pila, MD;  Location: [facility];  Service: Gastroenterology;  Laterality: N/A;   ESOPHAGOGASTRODUODENOSCOPY  2018   [facility]    ESOPHAGOGASTRODUODENOSCOPY (EGD) WITH PROPOFOL  N/A 09/01/2022   Procedure: ESOPHAGOGASTRODUODENOSCOPY (EGD) WITH PROPOFOL ;  Surgeon: Lajuan Pila, MD;  Location: [facility];  Service: Gastroenterology;  Laterality: N/A;   IR IMAGING GUIDED PORT INSERTION  06/29/2023   KNEE SURGERY     right, repair torn torn cartialage    PACEMAKER INSERTION  08/2014   changed last in 2023 per pt at [facility]   TONSILLECTOMY     TUBAL LIGATION      Family History  Problem Relation Age of Onset   Diabetes Mother    Hypertension Mother    Heart disease Mother        s/p 1 stent   Hyperlipidemia Mother    Arthritis Mother    Colon cancer Father 59   Irritable bowel syndrome Sister    Hypertension Maternal Grandmother    Arthritis Maternal Grandmother    Heart disease Maternal Grandfather        MI   Hypertension Maternal Grandfather    Arthritis Maternal Grandfather    Hyperlipidemia Daughter    Hypertension Daughter    Hypertension Son    Esophageal cancer Neg Hx    Uterine cancer Neg Hx    Pancreatic cancer Neg Hx     Social History   Socioeconomic History   Marital status: Single    Spouse name: [name]   Number of children: 3   Years of education: [name]   Highest education level: High school graduate  Occupational History   [name]  Tobacco Use   Smoking status: Never    Passive exposure: Never   Smokeless tobacco: Never  Vaping Use   Vaping status: Never Used  Substance and Sexual Activity   Alcohol use: No   Drug use: No   Sexual activity: Not Currently    Partners: Male    Birth control/protection: Post-menopausal  Other Topics Concern   [name]  Social History Narrative   Lives at home alone   Retired   Caffeine: coffee   Social Drivers of Health   Financial Resource Strain: Medium Risk (07/08/2023)   Overall Financial Resource Strain (CARDIA)    Difficulty of Paying Living Expenses: Somewhat hard  Food Insecurity: Low Risk  (12/20/2023)   Received from [facility]   Hunger Vital Sign    Within the past 12 months, you worried that your food would run out before you got money to buy more: Never true    Within the past 12 months, the food you bought just didn't last and you didn't have money to get more. : Never true  Transportation Needs: No Transportation  Needs (12/20/2023)   Received from [facility]    In the past 12 months, has lack of reliable transportation kept you from medical appointments, meetings, work or from getting things needed for daily living? : No  Physical Activity: Inactive (07/08/2023)   Exercise Vital Sign    Days of Exercise per Week: 0 days    Minutes of Exercise per Session: 0 min  Stress: No Stress Concern Present (01/21/2023)   [employer] of Occupational Health - Occupational Stress Questionnaire    Feeling of Stress : Not at all  Social Connections: Moderately Isolated (07/08/2023)   Social Connection and Isolation Panel    Frequency of Communication with Friends and Family: More than  three times a week    Frequency of Social Gatherings with Friends and Family: Twice a week    Attends Religious Services: More than 4 times per year    Active Member of [employer] or Organizations: No    Attends Banker Meetings: Never    Marital Status: Divorced  Catering manager Violence: Unknown (11/29/2023)   Humiliation, Afraid, Rape, and Kick questionnaire    Fear of Current or Ex-Partner: No    Emotionally Abused: No    Physically Abused: No    Sexually Abused: [name]    Outpatient Medications Prior to Visit  Medication Sig Dispense Refill   acetaminophen  (TYLENOL ) 500 MG tablet Take 500 mg by mouth every 6 (six) hours as needed.     acyclovir  (ZOVIRAX ) 400 MG tablet TAKE ONE (1) TABLET BY MOUTH TWO (2) TIMES DAILY 60 tablet 3   albuterol  (PROVENTIL ) (2.5 MG/3ML) 0.083% nebulizer solution Take 3 mLs (2.5 mg total) by nebulization every 6 (six) hours as needed for wheezing or shortness of breath. 150 mL 2   albuterol  (VENTOLIN  HFA) 108 (90 Base) MCG/ACT inhaler Inhale 2 puffs into the lungs every 6 (six) hours as needed for wheezing or shortness of breath. 18 g 5   ALPRAZolam  (XANAX ) 0.25 MG tablet Take 1-2 tablets (0.25-0.5 mg total) by mouth 3 (three) times daily as needed for anxiety.  40 tablet 1   amLODipine  (NORVASC ) 5 MG tablet Take 5 mg by mouth daily.     aspirin  EC 81 MG tablet Take 81 mg by mouth daily. Swallow whole.     budesonide  (PULMICORT ) 0.5 MG/2ML nebulizer solution Take 2 mLs (0.5 mg total) by nebulization 2 (two) times daily as needed (cough, wheezing or shortness of breath). 120 mL 11   celecoxib  (CELEBREX ) 100 MG capsule Take 100 mg by mouth 2 (two) times daily.     cetirizine  (ZYRTEC ) 10 MG tablet Take 10 mg by mouth at bedtime.     Cholecalciferol (VITAMIN D3) 50 MCG (2000 UT) TABS Take 2,000 Units by mouth in the morning.     cholestyramine  light (PREVALITE ) 4 g packet Take 4 g by mouth 2 (two) times daily.     cyclobenzaprine  (FLEXERIL ) 5 MG tablet Take 1 tablet (5 mg total) by mouth 2 (two) times daily as needed for up to 10 doses for muscle spasms. 10 tablet 0   dicyclomine  (BENTYL ) 10 MG capsule Take 1 capsule (10 mg total) by mouth 4 (four) times daily -  before meals and at bedtime. cramping 90 capsule 1   diphenoxylate -atropine  (LOMOTIL ) 2.5-0.025 MG tablet Take 1 tablet by mouth 2 (two) times daily. 60 tablet 6   estradiol  (ESTRACE ) 0.1 MG/GM vaginal cream Place 0.5 g vaginally 2 (two) times a week. Place 0.5g nightly for two weeks then twice a week after 30 g 3   ferrous sulfate (FEROSUL) 325 (65 FE) MG tablet Take 1 tablet (325 mg total) by mouth daily with breakfast. 90 tablet 0   fluticasone  (FLONASE ) 50 MCG/ACT nasal spray Place 2 sprays into both nostrils daily. 16 g 0   furosemide  (LASIX ) 20 MG tablet Take 20 mg by mouth daily as needed (fluid retention (feet swelling)).     HYDROcodone  bit-homatropine (HYDROMET) 5-1.5 MG/5ML syrup Take 5 mLs by mouth every 8 (eight) hours as needed for cough. 120 mL 0   HYDROcodone -acetaminophen  (NORCO/VICODIN) 5-325 MG tablet Take 1 tablet by mouth every 6 (six) hours as needed for severe pain (pain score 7-10).  10 tablet 0   hydrOXYzine  (ATARAX ) 10 MG tablet TAKE ONE (1) TABLET BY MOUTH 3 TIMES DAILY AS  NEEDED FOR ITCHING 30 tablet 2   hyoscyamine  (LEVSIN  SL) 0.125 MG SL tablet Place 1 tablet (0.125 mg total) under the tongue every 4 (four) hours as needed. 30 tablet 1   lidocaine  (XYLOCAINE ) 5 % ointment Apply 1 Application topically as needed. 35.44 g 0   lidocaine -prilocaine  (EMLA ) cream Apply 1 Application topically as needed. 30 g 0   loperamide  (IMODIUM ) 2 MG capsule Take 4 mg by mouth in the morning and at bedtime.     losartan  (COZAAR ) 50 MG tablet Take 50 mg by mouth in the morning and at bedtime.     Magnesium  Glycinate 100 MG CAPS Take 200 mg by mouth at bedtime.     meclizine  (ANTIVERT ) 12.5 MG tablet Take 12.5 mg by mouth 2 (two) times daily as needed for dizziness or nausea.     metoprolol  succinate (TOPROL -XL) 50 MG 24 hr tablet Take 1 tablet (50 mg total) by mouth 2 (two) times daily. 60 tablet 2   metroNIDAZOLE  (METROGEL ) 0.75 % gel Insert One applicatorful (5 g ) vaginally at bedtime for 5 days 45 g 0   montelukast  (SINGULAIR ) 10 MG tablet Take 1 tablet (10 mg total) by mouth at bedtime. Please start taking this on September 4. 30 tablet 3   Multiple Vitamins-Minerals (CENTRUM SILVER  WOMEN 50+ PO) Take 1 tablet by mouth daily at 6 (six) AM.     nortriptyline  (PAMELOR ) 10 MG capsule Take 1 capsule (10 mg total) by mouth at bedtime. 30 capsule 1   ondansetron  (ZOFRAN ) 8 MG tablet Take 1 tablet (8 mg total) by mouth every 8 (eight) hours as needed for nausea or vomiting. 30 tablet 1   oxyCODONE -acetaminophen  (PERCOCET) 5-325 MG tablet Take 2 tablets by mouth every 6 (six) hours as needed. 10 tablet 0   pantoprazole  (PROTONIX ) 40 MG tablet Take 1 tablet (40 mg total) by mouth daily. 30 tablet 3   phenazopyridine  (PYRIDIUM ) 200 MG tablet Take 1 tablet (200 mg total) by mouth 3 (three) times daily as needed for pain. 30 tablet 0   potassium chloride  SA (KLOR-CON  M) 20 MEQ tablet TAKE 1 TABLET BY MOUTH EVERY MONDAY, WEDNESDAY, AND FRIDAY. 30 tablet 5   prochlorperazine  (COMPAZINE ) 10 MG  tablet Take 0.5 tablets (5 mg total) by mouth 2 (two) times daily as needed for nausea or vomiting. 60 tablet 1   Respiratory Therapy Supplies (PILLOW MASK/ADULT) MISC      sertraline  (ZOLOFT ) 50 MG tablet Take 1 tablet (50 mg total) by mouth daily. 30 tablet 3   spironolactone  (ALDACTONE ) 25 MG tablet Take 25 mg by mouth daily.     Vibegron  (GEMTESA ) 75 MG TABS Take 1 tablet (75 mg total) by mouth daily. 30 tablet 2   famotidine  (PEPCID ) 20 MG tablet Take 1 tablet (20 mg total) by mouth at bedtime. (Patient not taking: Reported on 04/21/2024) 30 tablet 2   neomycin -polymyxin-hydrocortisone (CORTISPORIN) OTIC solution Place 4 drops into both ears 4 (four) times daily. (Patient not taking: Reported on 04/21/2024) 10 mL 4   scopolamine  (TRANSDERM-SCOP) 1 MG/3DAYS Place 1 patch (1.5 mg total) onto the skin every 3 (three) days. (Patient not taking: Reported on 04/21/2024) 10 patch 1   Facility-Administered Medications Prior to Visit  Medication Dose Route Frequency Provider Last Rate Last Admin   acetaminophen  (TYLENOL ) tablet 650 mg  650 mg Oral Once Ennever, Peter R,  MD       nitroGLYCERIN  (NITROSTAT ) SL tablet 0.4 mg  0.4 mg Sublingual Once Blyth, Stacey A, MD       sodium chloride  flush (NS) 0.9 % injection 10 mL  10 mL Intracatheter PRN Ivor Mars, MD   10 mL at 09/24/23 1640    Allergies  Allergen Reactions   Benazepril  Anaphylaxis, Swelling and Hives    angioedema Throat and lip swelling   Ondansetron  Hcl Hives    Redness and hives post IV admin on 07/05/17   Codeine Nausea And Vomiting   Morphine Hives    Redness and hives noted post IV admin on 07/05/17    ROS See HPI    Objective:    Physical Exam  General: No acute distress. Awake and conversant.  Eyes: Normal conjunctiva, anicteric. Round symmetric pupils.  Respiratory: CTAB. Respirations are non-labored. No wheezing.  Skin: Warm. No rashes or ulcers.  Psych: Alert and oriented. Cooperative, Appropriate mood and  affect, Normal judgment.  CV: RRR. No murmur. No lower extremity edema.  MSK: Normal ambulation. No clubbing or cyanosis.    BP 126/80 (BP Location: Right Arm, Patient Position: Sitting, Cuff Size: Normal)   Pulse 76   Temp 98 F (36.7 C) (Oral)   Resp 12   Ht 5' 5 (1.651 m)   Wt 194 lb 6.4 oz (88.2 kg)   SpO2 97%   BMI 32.35 kg/m  Wt Readings from Last 3 Encounters:  04/21/24 194 lb 6.4 oz (88.2 kg)  04/21/24 194 lb 11.2 oz (88.3 kg)  03/23/24 195 lb (88.5 kg)       Assessment & Plan:   Problem List Items Addressed This Visit     Chronic diarrhea - Primary   She is s/p cholecystectomy.  C. difficile test negative. Current medications include Imodium  4mg  BID, Bentyl  10 mg QID as needed.  Gastroenterology recommendations: 20 mg Bentyl  4 times daily and nortriptyline  25 mg HS. Currently she is on Bentyl  10 mg dose but reports only taking medication twice daily prn.  Discussed with patient that she can take medication up to 4 x/ per day for abdominal symptoms.  Patient to increase frequency, and if still having symptoms consider increasing dose to 20 mg per GI recommendation.  GI also recommend nortriptyline  25 mg at bedtime.  She has not started this medication.  Discussed options with patient r/t medication management and at this time she would like to try Bentyl  adjustment first and if ineffective, consider adding nortriptyline  25 mg at bedtime. Recommend maintaining hydration with water and Gatorade.       Hypertension   Well controlled, no changes to meds. Encouraged heart healthy diet such as the DASH diet and exercise as tolerated.        Hypomagnesemia   Supplement and monitor         Multiple myeloma (HCC) (Chronic)   Follows with oncology       I am having Tami [name] maintain her fluticasone , furosemide , losartan , cetirizine , Vitamin D3, Magnesium  Glycinate, meclizine , metoprolol  succinate, aspirin  EC, loperamide , spironolactone , hydrOXYzine ,  cholestyramine  light, ALPRAZolam , nortriptyline , sertraline , celecoxib , lidocaine -prilocaine , scopolamine , montelukast , Multiple Vitamins-Minerals (CENTRUM SILVER  WOMEN 50+ PO), ondansetron , acetaminophen , neomycin -polymyxin-hydrocortisone, oxyCODONE -acetaminophen , pantoprazole , famotidine , HYDROcodone  bit-homatropine, amLODipine , Pillow Mask/Adult, potassium chloride  SA, dicyclomine , prochlorperazine , albuterol , albuterol , budesonide , cyclobenzaprine , ferrous sulfate, diphenoxylate -atropine , hyoscyamine , estradiol , Gemtesa , phenazopyridine , lidocaine , acyclovir , metroNIDAZOLE , and HYDROcodone -acetaminophen . We will continue to administer nitroGLYCERIN .  No orders of the defined types were placed in this encounter.

## 2024-04-21 NOTE — Patient Instructions (Signed)

## 2024-04-21 NOTE — Assessment & Plan Note (Signed)
 Supplement and monitor

## 2024-04-21 NOTE — Assessment & Plan Note (Addendum)
 She is s/p cholecystectomy.  C. difficile test negative. Current medications include Imodium  4mg  BID, Bentyl  10 mg QID as needed.  Gastroenterology recommendations: 20 mg Bentyl  4 times daily and nortriptyline  25 mg HS. Currently she is on Bentyl  10 mg dose but reports only taking medication twice daily prn.  Discussed with patient that she can take medication up to 4 x/ per day for abdominal symptoms.  Patient to increase frequency, and if still having symptoms consider increasing dose to 20 mg per GI recommendation.  GI also recommend nortriptyline  25 mg at bedtime.  She has not started this medication.  Discussed options with patient r/t medication management and at this time she would like to try Bentyl  adjustment first and if ineffective, consider adding nortriptyline  25 mg at bedtime. Recommend maintaining hydration with water and Gatorade.

## 2024-04-21 NOTE — Progress Notes (Signed)
 No treatment today per Dr. Maria Shiner. Port flushed per protocol.
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
## 2024-04-22 LAB — IGG, IGA, IGM
IgA: 38 mg/dL — ABNORMAL LOW (ref 87–352)
IgG (Immunoglobin G), Serum: 859 mg/dL (ref 586–1602)
IgM (Immunoglobulin M), Srm: 20 mg/dL — ABNORMAL LOW (ref 26–217)

## 2024-04-24 DIAGNOSIS — R1084 Generalized abdominal pain: Secondary | ICD-10-CM | POA: Diagnosis not present

## 2024-04-24 DIAGNOSIS — G44319 Acute post-traumatic headache, not intractable: Secondary | ICD-10-CM | POA: Diagnosis not present

## 2024-04-24 DIAGNOSIS — H539 Unspecified visual disturbance: Secondary | ICD-10-CM | POA: Diagnosis not present

## 2024-04-24 DIAGNOSIS — N390 Urinary tract infection, site not specified: Secondary | ICD-10-CM | POA: Diagnosis not present

## 2024-04-24 DIAGNOSIS — N3001 Acute cystitis with hematuria: Secondary | ICD-10-CM | POA: Diagnosis not present

## 2024-04-24 DIAGNOSIS — K580 Irritable bowel syndrome with diarrhea: Secondary | ICD-10-CM | POA: Diagnosis not present

## 2024-04-24 DIAGNOSIS — S060X0D Concussion without loss of consciousness, subsequent encounter: Secondary | ICD-10-CM | POA: Diagnosis not present

## 2024-04-24 LAB — KAPPA/LAMBDA LIGHT CHAINS
Kappa free light chain: 6.8 mg/L (ref 3.3–19.4)
Kappa, lambda light chain ratio: 2.96 — ABNORMAL HIGH (ref 0.26–1.65)
Lambda free light chains: 2.3 mg/L — ABNORMAL LOW (ref 5.7–26.3)

## 2024-04-25 ENCOUNTER — Other Ambulatory Visit

## 2024-04-25 ENCOUNTER — Emergency Department (HOSPITAL_BASED_OUTPATIENT_CLINIC_OR_DEPARTMENT_OTHER): Admission: EM | Admit: 2024-04-25 | Discharge: 2024-04-25 | Disposition: A | Discharge status: Home / Self Care

## 2024-04-25 ENCOUNTER — Encounter (HOSPITAL_BASED_OUTPATIENT_CLINIC_OR_DEPARTMENT_OTHER): Payer: Self-pay | Admitting: *Deleted

## 2024-04-25 ENCOUNTER — Other Ambulatory Visit: Payer: Self-pay

## 2024-04-25 DIAGNOSIS — R197 Diarrhea, unspecified: Secondary | ICD-10-CM | POA: Diagnosis not present

## 2024-04-25 DIAGNOSIS — Z7982 Long term (current) use of aspirin: Secondary | ICD-10-CM | POA: Diagnosis not present

## 2024-04-25 DIAGNOSIS — R5383 Other fatigue: Secondary | ICD-10-CM | POA: Diagnosis not present

## 2024-04-25 DIAGNOSIS — Z8579 Personal history of other malignant neoplasms of lymphoid, hematopoietic and related tissues: Secondary | ICD-10-CM | POA: Insufficient documentation

## 2024-04-25 LAB — CBC WITH DIFFERENTIAL/PLATELET
Abs Immature Granulocytes: 0.01 10*3/uL (ref 0.00–0.07)
Basophils Absolute: 0 10*3/uL (ref 0.0–0.1)
Basophils Relative: 1 %
Eosinophils Absolute: 0.1 10*3/uL (ref 0.0–0.5)
Eosinophils Relative: 2 %
HCT: 37.5 % (ref 36.0–46.0)
Hemoglobin: 12.2 g/dL (ref 12.0–15.0)
Immature Granulocytes: 0 %
Lymphocytes Relative: 26 %
Lymphs Abs: 1 10*3/uL (ref 0.7–4.0)
MCH: 29.6 pg (ref 26.0–34.0)
MCHC: 32.5 g/dL (ref 30.0–36.0)
MCV: 91 fL (ref 80.0–100.0)
Monocytes Absolute: 0.3 10*3/uL (ref 0.1–1.0)
Monocytes Relative: 8 %
Neutro Abs: 2.5 10*3/uL (ref 1.7–7.7)
Neutrophils Relative %: 63 %
Platelets: 152 10*3/uL (ref 150–400)
RBC: 4.12 MIL/uL (ref 3.87–5.11)
RDW: 12.7 % (ref 11.5–15.5)
WBC: 3.9 10*3/uL — ABNORMAL LOW (ref 4.0–10.5)
nRBC: 0 % (ref 0.0–0.2)

## 2024-04-25 LAB — COMPREHENSIVE METABOLIC PANEL WITH GFR
ALT: 20 U/L (ref 0–44)
AST: 24 U/L (ref 15–41)
Albumin: 4.2 g/dL (ref 3.5–5.0)
Alkaline Phosphatase: 56 U/L (ref 38–126)
Anion gap: 13 (ref 5–15)
BUN: 22 mg/dL (ref 8–23)
CO2: 21 mmol/L — ABNORMAL LOW (ref 22–32)
Calcium: 9.5 mg/dL (ref 8.9–10.3)
Chloride: 108 mmol/L (ref 98–111)
Creatinine, Ser: 0.69 mg/dL (ref 0.44–1.00)
GFR, Estimated: >60 mL/min (ref >60)
Glucose, Bld: 113 mg/dL — ABNORMAL HIGH (ref 70–99)
Potassium: 4.2 mmol/L (ref 3.5–5.1)
Sodium: 142 mmol/L (ref 135–145)
Total Bilirubin: 0.5 mg/dL (ref 0.0–1.2)
Total Protein: 6.8 g/dL (ref 6.5–8.1)

## 2024-04-25 LAB — LIPASE, BLOOD: Lipase: 29 U/L (ref 11–51)

## 2024-04-25 MED ORDER — LACTATED RINGERS IV BOLUS
1000.0000 mL | Freq: Once | INTRAVENOUS | Status: AC
  Administered 2024-04-25: 1000 mL via INTRAVENOUS

## 2024-04-25 MED ORDER — PROMETHAZINE HCL 25 MG/ML IJ SOLN
INTRAMUSCULAR | Status: AC
  Filled 2024-04-25: qty 1

## 2024-04-25 MED ORDER — PROMETHAZINE (PHENERGAN) 6.25MG IN NS 50ML IVPB
6.2500 mg | Freq: Once | INTRAVENOUS | Status: AC
  Administered 2024-04-25: 6.25 mg via INTRAVENOUS
  Filled 2024-04-25: qty 50

## 2024-04-25 NOTE — ED Triage Notes (Signed)
 Pt has been having diarrhea since Friday.  Pt has had 4 episodes of diarrhea in the past 24 hours but generally has had 2-3 runny stools daily.  Pt has had nausea with this, no vomiting.  No fever.

## 2024-04-25 NOTE — Discharge Instructions (Addendum)
 Your workup today was reassuring.  Please drink plenty of fluids and follow-up with your doctor.  Return to the ER for worsening symptoms.

## 2024-04-25 NOTE — ED Provider Notes (Signed)
 [facility] Provider Note   CSN: [id number] Arrival date & time: 04/25/24  4098     Patient presents with: Diarrhea   Tami [name] is a 68 y.o. female.   72 female with past medical history of multiple myeloma and chronic abdominal pain and diarrhea presenting to the emergency department today with fatigue.  The patient states that this occurs when she gets dehydrated from the diarrhea.  She denies any blood in her stool or dark stools.  Denies any fevers or chills.  States she is having some mild abdominal cramping but this is a chronic issue for her.  She states that she has seen gastroenterology as well as her primary care and infectious disease in the past for this and her workup has been unremarkable.  She came to the ER today for further evaluation regarding this due to ongoing symptoms.   Diarrhea      Prior to Admission medications   Medication Sig Start Date End Date Taking? Authorizing Provider  acetaminophen  (TYLENOL ) 500 MG tablet Take 500 mg by mouth every 6 (six) hours as needed.    [provider]  acyclovir  (ZOVIRAX ) 400 MG tablet TAKE ONE (1) TABLET BY MOUTH TWO (2) TIMES DAILY 02/08/24   Ivor Mars, MD  albuterol  (PROVENTIL ) (2.5 MG/3ML) 0.083% nebulizer solution Take 3 mLs (2.5 mg total) by nebulization every 6 (six) hours as needed for wheezing or shortness of breath. 12/14/23   Wilfredo Hanly, MD  albuterol  (VENTOLIN  HFA) 108 (90 Base) MCG/ACT inhaler Inhale 2 puffs into the lungs every 6 (six) hours as needed for wheezing or shortness of breath. 12/14/23   Wilfredo Hanly, MD  ALPRAZolam  (XANAX ) 0.25 MG tablet Take 1-2 tablets (0.25-0.5 mg total) by mouth 3 (three) times daily as needed for anxiety. 06/14/23   Neda Balk, MD  amLODipine  (NORVASC ) 5 MG tablet Take 5 mg by mouth daily. 09/14/23   [provider]  aspirin  EC 81 MG tablet Take 81 mg by mouth daily. Swallow whole.    [provider]  budesonide  (PULMICORT ) 0.5 MG/2ML nebulizer solution Take 2 mLs (0.5 mg total) by nebulization 2 (two) times daily as needed (cough, wheezing or shortness of breath). 12/14/23   Wilfredo Hanly, MD  celecoxib  (CELEBREX ) 100 MG capsule Take 100 mg by mouth 2 (two) times daily. 06/07/23   [provider]  cetirizine  (ZYRTEC ) 10 MG tablet Take 10 mg by mouth at bedtime.    [provider]  Cholecalciferol (VITAMIN D3) 50 MCG (2000 UT) TABS Take 2,000 Units by mouth in the morning.    [provider]  cholestyramine  light (PREVALITE ) 4 g packet Take 4 g by mouth 2 (two) times daily.    [provider]  cyclobenzaprine  (FLEXERIL ) 5 MG tablet Take 1 tablet (5 mg total) by mouth 2 (two) times daily as needed for up to 10 doses for muscle spasms. 01/03/24   Curatolo, Adam, DO  dicyclomine  (BENTYL ) 10 MG capsule Take 1 capsule (10 mg total) by mouth 4 (four) times daily -  before meals and at bedtime. cramping 12/02/23 04/21/24  Neda Balk, MD  diphenoxylate -atropine  (LOMOTIL ) 2.5-0.025 MG tablet Take 1 tablet by mouth 2 (two) times daily. 01/24/24   Neda Balk, MD  estradiol  (ESTRACE ) 0.1 MG/GM vaginal cream Place 0.5 g vaginally 2 (two) times a week. Place 0.5g nightly for two weeks then twice a week after 02/03/24  Darlene Ehlers, MD  famotidine  (PEPCID ) 20 MG tablet Take 1 tablet (20 mg total) by mouth at bedtime. Patient not taking: Reported on 04/21/2024 09/16/23   Neda Balk, MD  ferrous sulfate (FEROSUL) 325 (65 FE) MG tablet Take 1 tablet (325 mg total) by mouth daily with breakfast. 01/19/24   Neda Balk, MD  fluticasone  (FLONASE ) 50 MCG/ACT nasal spray Place 2 sprays into both nostrils daily. 01/07/20   Joy, Shawn C, PA-C  furosemide  (LASIX ) 20 MG tablet Take 20 mg by mouth daily as needed (fluid retention (feet swelling)). 07/03/20   [provider]  HYDROcodone  bit-homatropine (HYDROMET) 5-1.5 MG/5ML syrup Take 5 mLs by mouth  every 8 (eight) hours as needed for cough. 09/20/23   Neda Balk, MD  HYDROcodone -acetaminophen  (NORCO/VICODIN) 5-325 MG tablet Take 1 tablet by mouth every 6 (six) hours as needed for severe pain (pain score 7-10). 03/08/24   Almond Army, MD  hydrOXYzine  (ATARAX ) 10 MG tablet TAKE ONE (1) TABLET BY MOUTH 3 TIMES DAILY AS NEEDED FOR ITCHING 03/03/23   Neda Balk, MD  hyoscyamine  (LEVSIN  SL) 0.125 MG SL tablet Place 1 tablet (0.125 mg total) under the tongue every 4 (four) hours as needed. 01/24/24   Neda Balk, MD  lidocaine  (XYLOCAINE ) 5 % ointment Apply 1 Application topically as needed. 01/31/24   Darlene Ehlers, MD  lidocaine -prilocaine  (EMLA ) cream Apply 1 Application topically as needed. 07/01/23   Ivor Mars, MD  loperamide  (IMODIUM ) 2 MG capsule Take 4 mg by mouth in the morning and at bedtime.    [provider]  losartan  (COZAAR ) 50 MG tablet Take 50 mg by mouth in the morning and at bedtime. 04/14/21   [provider]  Magnesium  Glycinate 100 MG CAPS Take 200 mg by mouth at bedtime. 09/21/22   [provider]  meclizine  (ANTIVERT ) 12.5 MG tablet Take 12.5 mg by mouth 2 (two) times daily as needed for dizziness or nausea. 01/28/22   [provider]  metoprolol  succinate (TOPROL -XL) 50 MG 24 hr tablet Take 1 tablet (50 mg total) by mouth 2 (two) times daily. 02/23/23   Neda Balk, MD  metroNIDAZOLE  (METROGEL ) 0.75 % gel Insert One applicatorful (5 g ) vaginally at bedtime for 5 days 02/28/24   Drubel, Heidi Llamas, PA-C  montelukast  (SINGULAIR ) 10 MG tablet Take 1 tablet (10 mg total) by mouth at bedtime. Please start taking this on September 4. 07/01/23   Ivor Mars, MD  Multiple Vitamins-Minerals (CENTRUM SILVER  WOMEN 50+ PO) Take 1 tablet by mouth daily at 6 (six) AM.    [provider]  neomycin -polymyxin-hydrocortisone (CORTISPORIN) OTIC solution Place 4 drops into both ears 4 (four) times daily. Patient not taking: Reported  on 04/21/2024 07/30/23   Ivor Mars, MD  nortriptyline  (PAMELOR ) 10 MG capsule Take 1 capsule (10 mg total) by mouth at bedtime. 06/14/23   Neda Balk, MD  ondansetron  (ZOFRAN ) 8 MG tablet Take 1 tablet (8 mg total) by mouth every 8 (eight) hours as needed for nausea or vomiting. 07/14/23   Ivor Mars, MD  oxyCODONE -acetaminophen  (PERCOCET) 5-325 MG tablet Take 2 tablets by mouth every 6 (six) hours as needed. 08/07/23   Dalene Duck, MD  pantoprazole  (PROTONIX ) 40 MG tablet Take 1 tablet (40 mg total) by mouth daily. 09/03/23   O'Sullivan, Melissa, NP  phenazopyridine  (PYRIDIUM ) 200 MG tablet Take 1 tablet (200 mg total) by mouth 3 (three) times daily as needed  for pain. 01/31/24   Darlene Ehlers, MD  potassium chloride  SA (KLOR-CON  M) 20 MEQ tablet TAKE 1 TABLET BY MOUTH EVERY MONDAY, WEDNESDAY, AND FRIDAY. 10/25/23   Neda Balk, MD  prochlorperazine  (COMPAZINE ) 10 MG tablet Take 0.5 tablets (5 mg total) by mouth 2 (two) times daily as needed for nausea or vomiting. 12/02/23   Neda Balk, MD  Respiratory Therapy Supplies (PILLOW MASK/ADULT) MISC  09/29/23   [provider]  scopolamine  (TRANSDERM-SCOP) 1 MG/3DAYS Place 1 patch (1.5 mg total) onto the skin every 3 (three) days. Patient not taking: Reported on 04/21/2024 07/01/23   Ivor Mars, MD  sertraline  (ZOLOFT ) 50 MG tablet Take 1 tablet (50 mg total) by mouth daily. 06/14/23   Neda Balk, MD  spironolactone  (ALDACTONE ) 25 MG tablet Take 25 mg by mouth daily.    [provider]  Vibegron  (GEMTESA ) 75 MG TABS Take 1 tablet (75 mg total) by mouth daily. 01/31/24   Darlene Ehlers, MD    Allergies: Benazepril , Ondansetron  hcl, Codeine, and Morphine    Review of Systems  Gastrointestinal:  Positive for diarrhea.  All other systems reviewed and are negative.   Updated Vital Signs BP 136/80 (BP Location: Right Arm)   Pulse 85   Temp 97.9 F (36.6 C) (Oral)   Resp 16   Ht 5' 5 (1.651 m)   Wt 88.2  kg   SpO2 97%   BMI 32.35 kg/m   Physical Exam Vitals and nursing note reviewed.   Gen: NAD Eyes: PERRL, EOMI HEENT: no oropharyngeal swelling, dry mucous membranes Neck: trachea midline Resp: clear to auscultation bilaterally Card: RRR, no murmurs, rubs, or gallops Abd: nontender, nondistended Extremities: no calf tenderness, no edema Vascular: 2+ radial pulses bilaterally, 2+ DP pulses bilaterally Skin: no rashes Psyc: acting appropriately   (all labs ordered are listed, but only abnormal results are displayed) Labs Reviewed  CBC WITH DIFFERENTIAL/PLATELET - Abnormal; Notable for the following components:      Result Value   WBC 3.9 (*)    All other components within normal limits  COMPREHENSIVE METABOLIC PANEL WITH GFR - Abnormal; Notable for the following components:   CO2 21 (*)    Glucose, Bld 113 (*)    All other components within normal limits  LIPASE, BLOOD    EKG: None  Radiology: No results found.   Procedures   Medications Ordered in the ED  lactated ringers  bolus 1,000 mL (1,000 mLs Intravenous New Bag/Given 04/25/24 0944)  promethazine  (PHENERGAN ) 6.25 mg/NS 50 mL IVPB (6.25 mg Intravenous New Bag/Given 04/25/24 0952)  promethazine  (PHENERGAN ) 25 MG/ML injection (  Return to [facility] 04/25/24 0951)                                    Medical Decision Making 68 year old female with past medical history of chronic abdominal pain and diarrhea presenting to the emergency department today with abdominal pain and diarrhea.  Patient is abdominal exam is reassuring.  Vital signs are reassuring as well.  I will give the patient IV fluids and obtain basic labs including LFTs and a lipase to evaluate for electrolyte abnormalities, anemia, or hepatobiliary pathology/pancreatitis.  Her abdominal exam is reassuring.  Suspicion for appendicitis, diverticulitis, colitis, or other emergent condition is low at this time.  I will give the patient Phenergan  for her nausea.   Will reevaluate for ultimate disposition.  I  think that if her symptoms resolve given this being a chronic issue for her think that she may be safely discharged.  The patient's labs here are reassuring.  She is feeling better on reassessment after the fluids.  She will be discharged with return precautions.  Amount and/or Complexity of Data Reviewed Labs: ordered.        Final diagnoses:  Diarrhea, unspecified type    ED Discharge Orders     None          Carin Charleston, MD 04/25/24 1025

## 2024-04-26 LAB — PROTEIN ELECTROPHORESIS, SERUM, WITH REFLEX
A/G Ratio: 1.1 (ref 0.7–1.7)
Albumin ELP: 3.3 g/dL (ref 2.9–4.4)
Alpha-1-Globulin: 0.2 g/dL (ref 0.0–0.4)
Alpha-2-Globulin: 0.9 g/dL (ref 0.4–1.0)
Beta Globulin: 1.1 g/dL (ref 0.7–1.3)
Gamma Globulin: 0.8 g/dL (ref 0.4–1.8)
Globulin, Total: 2.9 g/dL (ref 2.2–3.9)
M-Spike, %: 0.3 g/dL — ABNORMAL HIGH
SPEP Interpretation: 0
Total Protein ELP: 6.2 g/dL (ref 6.0–8.5)

## 2024-04-26 LAB — IMMUNOFIXATION REFLEX, SERUM
IgA: 43 mg/dL — ABNORMAL LOW (ref 87–352)
IgG (Immunoglobin G), Serum: 895 mg/dL (ref 586–1602)
IgM (Immunoglobulin M), Srm: 24 mg/dL — ABNORMAL LOW (ref 26–217)

## 2024-04-27 ENCOUNTER — Ambulatory Visit: Payer: Self-pay | Admitting: Family Medicine

## 2024-04-27 ENCOUNTER — Ambulatory Visit: Payer: Self-pay | Admitting: Hematology & Oncology

## 2024-04-27 LAB — CLOSTRIDIUM DIFFICILE BY PCR: Toxigenic C. Difficile by PCR: NEGATIVE
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
## 2024-04-29 DIAGNOSIS — R1084 Generalized abdominal pain: Secondary | ICD-10-CM | POA: Diagnosis not present

## 2024-04-29 DIAGNOSIS — I422 Other hypertrophic cardiomyopathy: Secondary | ICD-10-CM | POA: Diagnosis not present

## 2024-04-29 DIAGNOSIS — E785 Hyperlipidemia, unspecified: Secondary | ICD-10-CM | POA: Diagnosis not present

## 2024-04-29 DIAGNOSIS — H903 Sensorineural hearing loss, bilateral: Secondary | ICD-10-CM | POA: Diagnosis not present

## 2024-04-29 DIAGNOSIS — Z95810 Presence of automatic (implantable) cardiac defibrillator: Secondary | ICD-10-CM | POA: Diagnosis not present

## 2024-04-29 DIAGNOSIS — N390 Urinary tract infection, site not specified: Secondary | ICD-10-CM | POA: Diagnosis not present

## 2024-04-29 DIAGNOSIS — N179 Acute kidney failure, unspecified: Secondary | ICD-10-CM | POA: Diagnosis not present

## 2024-04-29 DIAGNOSIS — I5022 Chronic systolic (congestive) heart failure: Secondary | ICD-10-CM | POA: Diagnosis not present

## 2024-04-29 DIAGNOSIS — R197 Diarrhea, unspecified: Secondary | ICD-10-CM | POA: Diagnosis not present

## 2024-04-29 DIAGNOSIS — K521 Toxic gastroenteritis and colitis: Secondary | ICD-10-CM | POA: Diagnosis not present

## 2024-04-29 DIAGNOSIS — Z9049 Acquired absence of other specified parts of digestive tract: Secondary | ICD-10-CM | POA: Diagnosis not present

## 2024-04-29 DIAGNOSIS — Z7982 Long term (current) use of aspirin: Secondary | ICD-10-CM | POA: Diagnosis not present

## 2024-04-29 DIAGNOSIS — K219 Gastro-esophageal reflux disease without esophagitis: Secondary | ICD-10-CM | POA: Diagnosis not present

## 2024-04-29 DIAGNOSIS — G629 Polyneuropathy, unspecified: Secondary | ICD-10-CM | POA: Diagnosis not present

## 2024-04-29 DIAGNOSIS — N281 Cyst of kidney, acquired: Secondary | ICD-10-CM | POA: Diagnosis not present

## 2024-04-29 DIAGNOSIS — C9001 Multiple myeloma in remission: Secondary | ICD-10-CM | POA: Diagnosis not present

## 2024-04-29 DIAGNOSIS — I10 Essential (primary) hypertension: Secondary | ICD-10-CM | POA: Diagnosis not present

## 2024-04-29 DIAGNOSIS — I480 Paroxysmal atrial fibrillation: Secondary | ICD-10-CM | POA: Diagnosis not present

## 2024-04-29 DIAGNOSIS — G4733 Obstructive sleep apnea (adult) (pediatric): Secondary | ICD-10-CM | POA: Diagnosis not present

## 2024-04-29 DIAGNOSIS — Z950 Presence of cardiac pacemaker: Secondary | ICD-10-CM | POA: Diagnosis not present

## 2024-04-29 DIAGNOSIS — C9000 Multiple myeloma not having achieved remission: Secondary | ICD-10-CM | POA: Diagnosis not present

## 2024-04-29 DIAGNOSIS — I509 Heart failure, unspecified: Secondary | ICD-10-CM | POA: Diagnosis not present

## 2024-04-29 DIAGNOSIS — N2889 Other specified disorders of kidney and ureter: Secondary | ICD-10-CM | POA: Diagnosis not present

## 2024-04-29 DIAGNOSIS — D509 Iron deficiency anemia, unspecified: Secondary | ICD-10-CM | POA: Diagnosis not present

## 2024-04-29 DIAGNOSIS — D849 Immunodeficiency, unspecified: Secondary | ICD-10-CM | POA: Diagnosis not present

## 2024-04-29 DIAGNOSIS — Z8673 Personal history of transient ischemic attack (TIA), and cerebral infarction without residual deficits: Secondary | ICD-10-CM | POA: Diagnosis not present

## 2024-04-29 DIAGNOSIS — I110 Hypertensive heart disease with heart failure: Secondary | ICD-10-CM | POA: Diagnosis not present

## 2024-04-29 DIAGNOSIS — T451X5A Adverse effect of antineoplastic and immunosuppressive drugs, initial encounter: Secondary | ICD-10-CM | POA: Diagnosis not present

## 2024-04-29 DIAGNOSIS — Z8616 Personal history of COVID-19: Secondary | ICD-10-CM | POA: Diagnosis not present

## 2024-04-29 DIAGNOSIS — N3090 Cystitis, unspecified without hematuria: Secondary | ICD-10-CM | POA: Diagnosis not present

## 2024-04-29 DIAGNOSIS — R109 Unspecified abdominal pain: Secondary | ICD-10-CM | POA: Diagnosis not present

## 2024-04-29 DIAGNOSIS — R0602 Shortness of breath: Secondary | ICD-10-CM | POA: Diagnosis not present

## 2024-05-01 ENCOUNTER — Ambulatory Visit: Admitting: Obstetrics

## 2024-05-02 ENCOUNTER — Encounter: Payer: Self-pay | Admitting: Hematology & Oncology

## 2024-05-02 ENCOUNTER — Ambulatory Visit: Attending: Student | Admitting: Physical Therapy

## 2024-05-03 ENCOUNTER — Telehealth: Payer: Self-pay

## 2024-05-03 NOTE — Transitions of Care (Post Inpatient/ED Visit) (Signed)
   05/03/2024  Name: Tami [name] MRN: [id number] DOB: 1955/12/03  Today's TOC FU Call Status: Today's TOC FU Call Status:: Unsuccessful Call (1st Attempt) Unsuccessful Call (1st Attempt) Date: 05/03/24  Attempted to reach the patient regarding the most recent Inpatient/ED visit.  Follow Up Plan: Additional outreach attempts will be made to reach the patient to complete the Transitions of Care (Post Inpatient/ED visit) call.   Medford Balboa, BSN, RN [place]  VBCI - [facility] RN Care Manager [phone]

## 2024-05-04 ENCOUNTER — Telehealth: Payer: Self-pay

## 2024-05-04 ENCOUNTER — Other Ambulatory Visit: Payer: Self-pay

## 2024-05-04 NOTE — Transitions of Care (Post Inpatient/ED Visit) (Signed)
   05/04/2024  Name: ANETTA OLVERA MRN: [id number] DOB: 01-24-1956  Today's TOC FU Call Status: Today's TOC FU Call Status:: Unsuccessful Call (2nd Attempt) Unsuccessful Call (1st Attempt) Date: 05/03/24 Unsuccessful Call (2nd Attempt) Date: 05/04/24  Attempted to reach the patient regarding the most recent Inpatient/ED visit.  Follow Up Plan: Additional outreach attempts will be made to reach the patient to complete the Transitions of Care (Post Inpatient/ED visit) call.   Medford Balboa, BSN, RN [place]  VBCI - [employer] [facility] RN Care Manager [phone]

## 2024-05-05 ENCOUNTER — Telehealth: Payer: Self-pay

## 2024-05-05 NOTE — Transitions of Care (Post Inpatient/ED Visit) (Signed)
   05/05/2024  Name: Tami [name] MRN: [id number] DOB: Sep 03, 1956  Today's TOC FU Call Status: Today's TOC FU Call Status:: Unsuccessful Call (3rd Attempt) Unsuccessful Call (3rd Attempt) Date: 05/05/24  Attempted to reach the patient regarding the most recent Inpatient/ED visit.  Follow Up Plan: Additional outreach attempts will be made to reach the patient to complete the Transitions of Care (Post Inpatient/ED visit) call.   Zacharia Sowles J. Lachina Salsberry RN, MSN [facility], [facility] RN Care Manager Direct Dial : [phone]  Fax: [phone] Website: [email or web address]

## 2024-05-07 NOTE — Assessment & Plan Note (Deleted)
 Hydrate and monitor

## 2024-05-07 NOTE — Assessment & Plan Note (Deleted)
 Supplement and monitor

## 2024-05-07 NOTE — Assessment & Plan Note (Deleted)
 Encourage heart healthy diet such as MIND or DASH diet, increase exercise, avoid trans fats, simple carbohydrates and processed foods, consider a krill or fish or flaxseed oil cap daily.

## 2024-05-07 NOTE — Assessment & Plan Note (Deleted)
 [id number] acceptable, minimize simple carbs. Increase exercise as tolerated.

## 2024-05-07 NOTE — Assessment & Plan Note (Deleted)
No recent exacerbation, follows with cardiology

## 2024-05-07 NOTE — Assessment & Plan Note (Deleted)
 Well controlled, no changes to meds. Encouraged heart healthy diet such as the DASH diet and exercise as tolerated.

## 2024-05-07 NOTE — Assessment & Plan Note (Deleted)
 Recently hospitalized at [facility] for acute worsening of abdominal pain

## 2024-05-11 ENCOUNTER — Inpatient Hospital Stay: Admitting: Family Medicine

## 2024-05-11 DIAGNOSIS — I5042 Chronic combined systolic (congestive) and diastolic (congestive) heart failure: Secondary | ICD-10-CM

## 2024-05-11 DIAGNOSIS — G4733 Obstructive sleep apnea (adult) (pediatric): Secondary | ICD-10-CM | POA: Diagnosis not present

## 2024-05-11 DIAGNOSIS — R109 Unspecified abdominal pain: Secondary | ICD-10-CM

## 2024-05-11 DIAGNOSIS — E785 Hyperlipidemia, unspecified: Secondary | ICD-10-CM

## 2024-05-11 DIAGNOSIS — I10 Essential (primary) hypertension: Secondary | ICD-10-CM

## 2024-05-11 DIAGNOSIS — M791 Myalgia, unspecified site: Secondary | ICD-10-CM

## 2024-05-11 DIAGNOSIS — R739 Hyperglycemia, unspecified: Secondary | ICD-10-CM

## 2024-05-13 DIAGNOSIS — R300 Dysuria: Secondary | ICD-10-CM | POA: Diagnosis not present

## 2024-05-13 DIAGNOSIS — N10 Acute tubulo-interstitial nephritis: Secondary | ICD-10-CM | POA: Diagnosis not present

## 2024-05-14 NOTE — Assessment & Plan Note (Signed)
 Encourage heart healthy diet such as MIND or DASH diet, increase exercise, avoid trans fats, simple carbohydrates and processed foods, consider a krill or fish or flaxseed oil cap daily.

## 2024-05-14 NOTE — Assessment & Plan Note (Signed)
 Recently hospitalized with abdominal pain crisis

## 2024-05-14 NOTE — Assessment & Plan Note (Signed)
 [id number] acceptable, minimize simple carbs. Increase exercise as tolerated.

## 2024-05-14 NOTE — Assessment & Plan Note (Signed)
 Well controlled, no changes to meds. Encouraged heart healthy diet such as the DASH diet and exercise as tolerated.

## 2024-05-17 ENCOUNTER — Inpatient Hospital Stay: Attending: Hematology & Oncology

## 2024-05-17 ENCOUNTER — Inpatient Hospital Stay: Admitting: Licensed Clinical Social Worker

## 2024-05-17 VITALS — BP 156/88 | HR 97 | Temp 98.1°F | Resp 20

## 2024-05-17 DIAGNOSIS — C9000 Multiple myeloma not having achieved remission: Secondary | ICD-10-CM | POA: Diagnosis not present

## 2024-05-17 MED ORDER — HEPARIN SOD (PORK) LOCK FLUSH 100 UNIT/ML IV SOLN
500.0000 [IU] | Freq: Once | INTRAVENOUS | Status: AC
  Administered 2024-05-17: 500 [IU] via INTRAVENOUS

## 2024-05-17 MED ORDER — SODIUM CHLORIDE 0.9 % IV SOLN: Freq: Once | INTRAVENOUS | Status: AC

## 2024-05-17 MED ORDER — ACETAMINOPHEN 325 MG PO TABS
650.0000 mg | ORAL_TABLET | Freq: Once | ORAL | Status: AC
  Administered 2024-05-17: 650 mg via ORAL
  Filled 2024-05-17: qty 2

## 2024-05-17 MED ORDER — SODIUM CHLORIDE 0.9% FLUSH
10.0000 mL | INTRAVENOUS | Status: DC | PRN
  Administered 2024-05-17: 10 mL via INTRAVENOUS

## 2024-05-17 NOTE — Addendum Note (Signed)
 Addended by: CLAUDENE FRANCE on: 05/17/2024 11:06 AM   Modules accepted: Orders

## 2024-05-17 NOTE — Progress Notes (Signed)
 [facility] Clinical Social Work  Initial Assessment   Tami [name] is a 68 y.o. year old female patient. Clinical Social Work was referred by nurse navigator for assessment of psychosocial needs.   SDOH (Social Determinants of Health) assessments performed: Yes SDOH Interventions    Flowsheet Row Care Coordination from 11/29/2023 in [facility] Coordination Clinical Support from 07/08/2023 in [facility] Coordination from 01/21/2023 in [facility] Care Coordination Clinical Support from 10/17/2021 in [facility] at [facility]  SDOH Interventions      Food Insecurity Interventions Intervention Not Indicated, Other (Comment)  [Foodstamps and Ucard] -- Intervention Not Indicated --  Housing Interventions Intervention Not Indicated -- Intervention Not Indicated --  Transportation Interventions Intervention Not Indicated, Other (Comment)  [Has Medicaid/UHC transportation and a car] -- Intervention Not Indicated --  Utilities Interventions Intervention Not Indicated -- Intervention Not Indicated --  Alcohol Usage Interventions -- Intervention Not Indicated (Score <7) -- --  Financial Strain Interventions -- Intervention Not Indicated -- --  Physical Activity Interventions -- Intervention Not Indicated -- Intervention Not Indicated  [pt has set a goal to increase activity]  Stress Interventions -- -- Intervention Not Indicated --  Social Connections Interventions -- Intervention Not Indicated -- --  Health Literacy Interventions -- Intervention Not Indicated -- --    SDOH Screenings   Food Insecurity: Low Risk  (04/29/2024)   Received from [facility]  Housing: Low Risk  (04/29/2024)   Received from [facility]  Transportation Needs: No Transportation Needs (04/29/2024)   Received from [facility]  Utilities: Low Risk  (04/29/2024)   Received from [facility]   Alcohol Screen: Low Risk  (07/08/2023)  Depression (PHQ2-9): Medium Risk (04/21/2024)  Financial Resource Strain: Medium Risk (07/08/2023)  Physical Activity: Inactive (07/08/2023)  Social Connections: Moderately Isolated (07/08/2023)  Stress: No Stress Concern Present (01/21/2023)  Tobacco Use: Low Risk  (04/29/2024)   Received from [facility]  Health Literacy: Adequate Health Literacy (07/08/2023)     Distress Screen completed: No     No data to display            Family/Social Information:  Housing Arrangement: patient lives alone Family members/support persons in your life? Family and Friends Transportation concerns: no  Employment: Retired  Income source: Actor concerns: No Type of concern: None Food access concerns: no Religious or spiritual practice: Yes Advanced directives: No Services Currently in place:  Medicare  Coping/ Adjustment to diagnosis: Patient understands treatment plan and what happens next? yes Concerns about diagnosis and/or treatment: Pain or discomfort during procedures Patient reported stressors: Adjusting to my illness Hopes and/or priorities: To get strength back. Patient enjoys time with family/ friends Current coping skills/ strengths: Capable of independent living , Manufacturing systems engineer , Contractor , General fund of knowledge , Religious Affiliation , and Supportive family/friends     SUMMARY: Current SDOH Barriers:  None per patient.  She did not express feelings of depression.  Clinical Social Work Clinical Goal(s):  Patient will work with SW to address concerns related to adjustment to her illness.  Interventions: Discussed common feeling and emotions when being diagnosed with cancer, and the importance of support during treatment Informed patient of the support team roles and support services at [facility] Provided CSW contact information and encouraged patient to call with any questions or  concerns Provided brief mental health counseling with regard to  her illness.    Follow Up Plan: CSW will follow-up with patient by phone  Patient verbalizes understanding of plan: Yes    Macario CHRISTELLA Au, LCSW Clinical Social Worker [facility]

## 2024-05-17 NOTE — Patient Instructions (Signed)
 Dehydration, Adult Dehydration is a condition in which there is not enough water or other fluids in the body. This happens when a person loses more fluids than they take in. Important organs cannot work right without the right amount of fluids. Any loss of fluids from the body can cause dehydration. Dehydration can be mild, worse, or very bad. It should be treated right away to keep it from getting very bad. What are the causes? Conditions that cause loss of water in the body. They include: Watery poop (diarrhea). Vomiting. Sweating a lot. Fever. Infection. Peeing (urinating) a lot. Not drinking enough fluids. Certain medicines, such as medicines that take extra fluid out of the body (diuretics). Lack of safe drinking water. Not being able to get enough water and food. What increases the risk? Having a long-term (chronic) illness that has not been treated the right way, such as: Diabetes. Heart disease. Kidney disease. Being 25 years of age or older. Having a disability. Living in a place that is high above the ground or sea (high in altitude). The thinner, drier air causes more fluid loss. Doing exercises that put stress on your body for a long time. Being active when in hot places. What are the signs or symptoms? Symptoms of dehydration depend on how bad it is. Mild or worse dehydration Thirst. Dry lips or dry mouth. Feeling dizzy or light-headed. Muscle cramps. Passing little pee or dark pee. Pee may be the color of tea. Headache. Very bad dehydration Changes in skin. Skin may: Be cold to the touch (clammy). Be blotchy or pale. Not go back to normal right after you pinch it and let it go. Little or no tears, pee, or sweat. Fast breathing. Low blood pressure. Weak pulse. Pulse that is more than 100 beats a minute when you are sitting still. Other changes, such as: Feeling very thirsty. Eyes that look hollow (sunken). Cold hands and feet. Being confused. Being very  tired (lethargic) or having trouble waking from sleep. Losing weight. Loss of consciousness. How is this treated? Treatment for this condition depends on how bad your dehydration is. Treatment should start right away. Do not wait until your condition gets very bad. Very bad dehydration is an emergency. You will need to go to a hospital. Mild or worse dehydration can be treated at home. You may be asked to: Drink more fluids. Drink an oral rehydration solution (ORS). This drink gives you the right amount of fluids, salts, and minerals (electrolytes). Very bad dehydration can be treated: With fluids through an IV tube. By correcting low levels of electrolytes in the body. By treating the problem that caused your dehydration. Follow these instructions at home: Oral rehydration solution If told by your doctor, drink an ORS: Make an ORS. Use instructions on the package. Start by drinking small amounts, about  cup (120 mL) every 5-10 minutes. Slowly drink more until you have had the amount that your doctor said to have.  Eating and drinking  Drink enough clear fluid to keep your pee pale yellow. If you were told to drink an ORS, finish the ORS first. Then, start slowly drinking other clear fluids. Drink fluids such as: Water. Do not drink only water. Doing that can make the salt (sodium) level in your body get too low. Water from ice chips you suck on. Fruit juice that you have added water to (diluted). Low-calorie sports drinks. Eat foods that have the right amounts of salts and minerals, such as bananas, oranges, potatoes,  tomatoes, or spinach. Do not drink alcohol. Avoid drinks that have caffeine or sugar. These include:: High-calorie sports drinks. Fruit juice that you did not add water to. Soda. Coffee or energy drinks. Avoid foods that are greasy or have a lot of fat or sugar. General instructions Take over-the-counter and prescription medicines only as told by your doctor. Do  not take sodium tablets. Doing that can make the salt level in your body get too high. Return to your normal activities as told by your doctor. Ask your doctor what activities are safe for you. Keep all follow-up visits. Your doctor may check and change your treatment. Contact a doctor if: You have pain in your belly (abdomen) and the pain: Gets worse. Stays in one place. You have a rash. You have a stiff neck. You get angry or annoyed more easily than normal. You are more tired or have a harder time waking than normal. You feel weak or dizzy. You feel very thirsty. Get help right away if: You have any symptoms of very bad dehydration. You vomit every time you eat or drink. Your vomiting gets worse, does not go away, or you vomit blood or green stuff. You are getting treatment, but symptoms are getting worse. You have a fever. You have a very bad headache. You have: Diarrhea that gets worse or does not go away. Blood in your poop (stool). This may cause poop to look black and tarry. No pee in 6-8 hours. Only a small amount of pee in 6-8 hours, and the pee is very dark. You have trouble breathing. These symptoms may be an emergency. Get help right away. Call 911. Do not wait to see if the symptoms will go away. Do not drive yourself to the hospital. This information is not intended to replace advice given to you by your health care provider. Make sure you discuss any questions you have with your health care provider. Document Revised: 05/25/2022 Document Reviewed: 05/25/2022 Elsevier Patient Education  2024 [employer].

## 2024-05-18 ENCOUNTER — Ambulatory Visit: Payer: Self-pay | Admitting: Family Medicine

## 2024-05-18 ENCOUNTER — Ambulatory Visit (INDEPENDENT_AMBULATORY_CARE_PROVIDER_SITE_OTHER): Admitting: Family Medicine

## 2024-05-18 VITALS — BP 130/80 | HR 76 | Resp 17 | Ht 65.0 in | Wt 193.2 lb

## 2024-05-18 DIAGNOSIS — I10 Essential (primary) hypertension: Secondary | ICD-10-CM | POA: Diagnosis not present

## 2024-05-18 DIAGNOSIS — E785 Hyperlipidemia, unspecified: Secondary | ICD-10-CM

## 2024-05-18 DIAGNOSIS — R109 Unspecified abdominal pain: Secondary | ICD-10-CM | POA: Diagnosis not present

## 2024-05-18 DIAGNOSIS — I110 Hypertensive heart disease with heart failure: Secondary | ICD-10-CM | POA: Diagnosis not present

## 2024-05-18 DIAGNOSIS — Z8679 Personal history of other diseases of the circulatory system: Secondary | ICD-10-CM | POA: Diagnosis not present

## 2024-05-18 DIAGNOSIS — H9203 Otalgia, bilateral: Secondary | ICD-10-CM | POA: Diagnosis not present

## 2024-05-18 DIAGNOSIS — I480 Paroxysmal atrial fibrillation: Secondary | ICD-10-CM | POA: Diagnosis not present

## 2024-05-18 DIAGNOSIS — Z95810 Presence of automatic (implantable) cardiac defibrillator: Secondary | ICD-10-CM | POA: Diagnosis not present

## 2024-05-18 DIAGNOSIS — R739 Hyperglycemia, unspecified: Secondary | ICD-10-CM | POA: Diagnosis not present

## 2024-05-18 DIAGNOSIS — I5042 Chronic combined systolic (congestive) and diastolic (congestive) heart failure: Secondary | ICD-10-CM | POA: Diagnosis not present

## 2024-05-18 LAB — CBC WITH DIFFERENTIAL/PLATELET
Basophils Absolute: 0 K/uL (ref 0.0–0.1)
Basophils Relative: 0.9 % (ref 0.0–3.0)
Eosinophils Absolute: 0 K/uL (ref 0.0–0.7)
Eosinophils Relative: 1.4 % (ref 0.0–5.0)
HCT: 38.7 % (ref 36.0–46.0)
Hemoglobin: 12.6 g/dL (ref 12.0–15.0)
Lymphocytes Relative: 25.9 % (ref 12.0–46.0)
Lymphs Abs: 0.9 K/uL (ref 0.7–4.0)
MCHC: 32.5 g/dL (ref 30.0–36.0)
MCV: 91.3 fl (ref 78.0–100.0)
Monocytes Absolute: 0.2 K/uL (ref 0.1–1.0)
Monocytes Relative: 7 % (ref 3.0–12.0)
Neutro Abs: 2.2 K/uL (ref 1.4–7.7)
Neutrophils Relative %: 64.8 % (ref 43.0–77.0)
Platelets: 141 K/uL — ABNORMAL LOW (ref 150.0–400.0)
RBC: 4.24 Mil/uL (ref 3.87–5.11)
RDW: 13.2 % (ref 11.5–15.5)
WBC: 3.4 K/uL — ABNORMAL LOW (ref 4.0–10.5)

## 2024-05-18 LAB — COMPREHENSIVE METABOLIC PANEL WITH GFR
ALT: 18 U/L (ref 0–35)
AST: 23 U/L (ref 0–37)
Albumin: 4.4 g/dL (ref 3.5–5.2)
Alkaline Phosphatase: 48 U/L (ref 39–117)
BUN: 13 mg/dL (ref 6–23)
CO2: 28 meq/L (ref 19–32)
Calcium: 9.5 mg/dL (ref 8.4–10.5)
Chloride: 105 meq/L (ref 96–112)
Creatinine, Ser: 0.75 mg/dL (ref 0.40–1.20)
GFR: 80.92 mL/min (ref >60.00)
Glucose, Bld: 104 mg/dL — ABNORMAL HIGH (ref 70–99)
Potassium: 4.5 meq/L (ref 3.5–5.1)
Sodium: 140 meq/L (ref 135–145)
Total Bilirubin: 0.6 mg/dL (ref 0.2–1.2)
Total Protein: 6.8 g/dL (ref 6.0–8.3)

## 2024-05-18 LAB — LIPID PANEL
Cholesterol: 195 mg/dL (ref 0–200)
HDL: 55.7 mg/dL (ref >39.00)
LDL Cholesterol: 121 mg/dL — ABNORMAL HIGH (ref 0–99)
NonHDL: 138.94
Total CHOL/HDL Ratio: 3
Triglycerides: 92 mg/dL (ref 0.0–149.0)
VLDL: 18.4 mg/dL (ref 0.0–40.0)

## 2024-05-18 LAB — TSH: TSH: 1.45 u[IU]/mL (ref 0.35–5.50)

## 2024-05-18 LAB — HEMOGLOBIN A1C: Hgb A1c MFr Bld: 5.7 % (ref 4.6–6.5)

## 2024-05-18 MED ORDER — SUCRALFATE 1 GM/10ML PO SUSP: 1.0000 g | Freq: Three times a day (TID) | ORAL | 1 refills | Status: AC

## 2024-05-18 MED ORDER — NEOMYCIN-POLYMYXIN-HC 3.5-10000-1 OT SOLN: 4.0000 [drp] | Freq: Four times a day (QID) | OTIC | 4 refills | Status: AC

## 2024-05-18 NOTE — Progress Notes (Signed)
 [facility] is now [facility]   [facility] Cardiology Office Visit Patient Name: Tami [name] DOB: 12-02-1955  MRN: [id number] Visit Date: 05/18/2024  Primary Cardiologist and APP:  Tami [name], M.D., Tami Phoenix [name]-C (EP), Tami Decree [name]-C  Primary Care Provider: Harlene Horton, MD  Chief Complaint:  Chief Complaint  Patient presents with  . Follow-up    6 months follow-up. Fatigue everyday.     Linked Problem List    Problem List Items Addressed This Visit     Hypertension   Biventricular ICD (implantable cardioverter-defibrillator) in place   Chronic combined systolic and diastolic congestive heart failure (HCC) - Primary   Hx of ventricular tachycardia   Paroxysmal atrial fibrillation (HCC)     Assessment   1. Chronic combined systolic and diastolic congestive heart failure [facility]): Nonischemic, idiopathic, LHC 2014/2021 with normal coronary arteries. TTE in 2018 EF 45-50%, 08/23/19 with EF reduced to 30-35% and grade 1 diastolic dysfunction (normal LAP), TTE on 04/27/20 with EF stable at 30-35%.  TTE on 11/19/2020 showed an LVEF of 25 to 30%.  TTE on 06/10/2022 showed an LVEF of 25 to 30%, severe global LV hypokinesis, severe concentric LVH, mild reduced RV systolic function. After AV optimization, TTE on 12/23 showed an EF of 30-35%, mildly reduced RV systolic function.  TTE in November 2024 showed an EF of 45 to 50%. NYHA class II. Appears relatively euvolemic, adequately perfused. She does have chronic fatigue which is exacerbated with episodes of acute/chronic diarrhea (multiple admissions for this, requires IV fluids frequently). No HF exacerbations. On metoprolol , losartan , spironolactone , and PRN Furosemide  (rare use, none recently).   2. Paroxysmal atrial fibrillation [facility]): Rare based on device trends. On Carvedilol . Not on anticoagulation due to low burden. CHA2DS2-VASc  Score: 6   3. Primary hypertension: Controlled.   4. Hx of ventricular tachycardia:  History of ICD shock on 04/26/2020 with normal coronary arteries, evidence of low potassium/magnesium  during this event which were appropriately repleted in the hospital. Stable, no recurrences.   5. Biventricular ICD (implantable cardioverter-defibrillator) in place: Device transmission on 04/27/2024 with AP 5.9%, CRT pacing 98.3%, 6 episodes of NSVT (longest 4 seconds in duration), patient activity 4.2 hours/day, no evidence of AT/AF OptiVol fluid levels below threshold with stable thoracic impedance. Follows with EP.   Plan   No medication changes today.  Will update an echocardiogram to reassess LVEF, valvular function, and other cardiac anatomy with her ongoing fatigue.   Return in about 6 months (around 11/18/2024) for Tami [name]--C .   HPI    Subjective:  Tami [name] is a 68 y.o. female with a past medical history of heart failure, history of ventricular tachycardia, paroxysmal atrial fibrillation, CRT-D in place, history of COVID-19 infection, hyperlipidemia, multiple myeloma, ocular migraine, OSA, GERD, peripheral neuropathy, osteoarthritis, osteoporosis, anxiety and depression, hypomagnesemia, hypokalemia, iron  deficiency anemia, vitamin D  deficiency, chronic diarrhea. She was last evaluated in the cardiology clinic/EP clinic on 02/09/2024.  She has a history of nonischemic cardiomyopathy with an EF of 25% and left bundle branch block. Apparently, she denied ICD placement back in 2015.  After further discussion and decline in EF, she had a dual-chamber ICD placed on 08/28/2014.  There was an attempt to place a CRT device which was limited secondary to coronary sinus anomaly.  HIS bundle pacing attempted but unfortunately pacing thresholds gradually increased (possible change in lead positioning?). She was admitted to the  hospital from 04/26/20 to 05/01/20 due to her ICD firing. She was in her kitchen  doing dishes when she felt a sudden onset of chest pain that she described as someone hitting her very hard in the center of her chest. Her device interrogation showed an episode of VT that was appropriately cardioverted with a 30 J shock after ATP unsuccessful. She had slightly low potassium/magensium levels (3.5/1.9). There was concern that her HIS bundle lead possibly dislodged. TTE on 04/27/20 revealed an EF of 30-35%, severe global LV hypokinesis, mild MR. LHC on 04/29/20 showed normal coronary arteries with no significant disease, RV HIS bundle lead was noted to be in the RA. EP consulted and advised no plan for resynchronization revision at the time. Her TTE on 11/19/2020 showed LVEF of 25 to 30%. She has been following closely with EP and was admitted to the hospital on 01/29/2022 for laser lead extraction and CRT-D upgrade. Her TTE on 06/10/2022 still showed an LVEF of 25 to 30% with mildly reduced RV systolic function.     During follow-up visits with cardiology clinic, she was complaining of ongoing diarrhea and hypomagnesemia.  She was advised to stop her PPI and start magnesium  glycinate.  Her carvedilol  was also discontinued (possibly contributing to diarrhea and nausea) and was transitioned to metoprolol  succinate 50 mg daily.  She was evaluated in the EP clinic in December 2023 and had AV optimization with limited TTE on 10/13/2022 showing an EF of 30 to 35%.    Since her last office visit, she has been evaluated in the hospital multiple times due to ongoing nausea and diarrhea.  She was admitted to the hospital November 2024 due to chest tightness thought to be related to costochondritis in the setting of an unresolved upper respiratory infection.  Her troponins were negative x 2 chest x-ray negative for acute pathology.  CTA was negative for PE.  EKG nonischemic her echocardiogram in November 2024 showed an EF of 45 to 50%. She was recently evaluated in the ER at [facility] due to diarrhea, chest  pain, nausea, and abdominal pain.  Troponin levels are negative with nonischemic EKG and unremarkable chest x-ray.  She was diagnosed with C. difficile and encouraged to continue taking antibiotics.   She is doing well from a cardiac standpoint. She denies chest pain, shortness of breath, palpitations, presyncope, and leg swelling. She has not used any of her as needed furosemide . She has chronic fatigue that has been going on for several years. Also, with her chronic diarrhea, she sometimes ends up with chronic dehydration, generalized weakness, and fatigue. She has been admitted to the hospital several times due to diarrhea.  She does get intermittent IV fluid infusions when she is dealing with diarrhea that does improve her fatigue.  She showed me a pamphlet and told me that one of her providers thinks she should consider having a fecal transplant.   Past Medical History: Medical History[1]  Past Surgical History: Surgical History[2]  Medications:  Current Medications[3]  Allergies: Allergies[4]  Social History: Tobacco Use History[5] Social History   Substance and Sexual Activity  Alcohol Use No   Social History   Substance and Sexual Activity  Drug Use No    Family History: Family History[6]   Review of Systems A complete ROS was performed with pertinent positives/negatives noted in the HPI. The remainder of the ROS are negative.   Physical Examination:   Vitals:   05/18/24 1418  BP: 123/80  Pulse: 87  SpO2: 96%     General Appearance:  Well-developed AA female. No acute distress.    Neck: Jugular venous pressure is normal.   Lungs:   Respirations are unlabored. The lungs are clear in all fields bilaterally without rales, rhonchi or wheezing.       Heart:  Heart sounds are regular. Heart rate is approximately 80-85 beats per minute.  Normal S1. Normal S2.  No S3 or S4.  1/6 systolic murmur present.    Extremities: No lower extremity pitting edema.   Pulses:  Radial pulses are 2+ and symmetrical.    Neurologic: The patient is awake, alert, and oriented to time, place, person and situation.     Cardiac Testing & Labs   Cardiac Testing: EKG on 03/06/24 showed atrial sensed ventricular paced rhythm with occasional PVCs.  Care Everywhere: Reviewed Yes   Sodium  Date/Time Value Ref Range Status  05/02/2024 02:36 AM 140 136 - 145 mmol/L Final   Potassium  Date/Time Value Ref Range Status  05/02/2024 02:36 AM 3.7 3.4 - 4.5 mmol/L Final   Magnesium   Date/Time Value Ref Range Status  03/06/2024 04:39 PM 1.7 (L) 1.9 - 2.7 mg/dL Final   Blood Urea Nitrogen (BUN)  Date/Time Value Ref Range Status  05/02/2024 02:36 AM 17 7 - 25 mg/dL Final   Creatinine  Date/Time Value Ref Range Status  05/02/2024 02:36 AM 0.57 (L) 0.60 - 1.20 mg/dL Final   Hemoglobin  Date/Time Value Ref Range Status  05/02/2024 02:36 AM 11.5 (L) 12.3 - 15.3 g/dL Final   Hematocrit  Date/Time Value Ref Range Status  05/02/2024 02:36 AM 34.1 (L) 35.9 - 44.6 % Final   Platelet Count (PLT)  Date/Time Value Ref Range Status  05/02/2024 02:36 AM 135 (L) 150 - 450 10*3/uL Final   Lab Results  Component Value Date   INR 1.0 09/18/2023   PROTIME 13.0 09/18/2023     The ASCVD Risk score (Arnett DK, et al., 2019) failed to calculate for the following reasons:   Risk score cannot be calculated because patient has a medical history suggesting prior/existing ASCVD    Orders   Orders Placed This Encounter  Procedures  . Transthoracic echo (TTE) complete    Standing Status:   Future    Expected Date:   05/18/2024    Expiration Date:   07/19/2026    Scheduling group::   [facility]    With image enhancement agent if clinically indicated?:   Yes     Portions of this note were dictated using DRAGON voice recognition software. Please disregard any errors in transcription.  I have personally spent 30 minutes involved in face-to-face and non-face-to-face activities  for this patient on the day of the visit.  Professional time spent includes the following activities, in addition to those noted in the documentation: Reviewing EMR, interpreting labs and test results, discussing treatment options, reviewing and adjusting medications, EMR documentation, counseling the patient, providing education, and, through shared decision making with the patient, developing plan of care.         [1] Past Medical History: Diagnosis Date  . Abnormal SPEP    Follows with hematology  . Anxiety and depression   . Arthritis, multiple joint involvement   . Biventricular ICD (implantable cardioverter-defibrillator) in place   . Cardiomyopathy    (CMD)   . Chronic headache disorder   . Chronic seasonal allergic rhinitis due to pollen   . Clostridium difficile infection   . Congestive heart failure    (  CMD)   . Constipation   . COVID-19 08/29/2020   monoclo  . Dysphagia   . Esophageal stricture    07/2017, dilated  . ETD (Eustachian tube dysfunction), bilateral   . GERD (gastroesophageal reflux disease)   . Hirsutism   . Hx of small bowel obstruction   . Hyperlipidemia   . Hypernatremia   . Hypertension   . Hypogammaglobulinemia (CMD)   . Hypokalemia   . Hypomagnesemia   . IFG (impaired fasting glucose)   . Insomnia   . Iron  deficiency anemia   . LBBB (left bundle branch block)   . Macular scar of right eye   . Monoclonal gammopathy of unknown significance (MGUS)   . Multiple myeloma    (CMD)   . Multiple myeloma (HCC) 07/20/2018  . Ocular migraine   . OSA (obstructive sleep apnea)    Using CPAP machine.  . OSA on CPAP   . Osteoporosis   . Pancytopenia (CMD)   . Paresthesias   . Peripheral neuropathy   . Sensorineural hearing loss (SNHL) of both ears   . Shift work sleep disorder   . Thyroid  nodule   . TIA (transient ischemic attack)   . Tinnitus of both ears   . VAIN (vaginal intraepithelial neoplasia)   . Vitamin D  deficiency   [2] Past Surgical  History: Procedure Laterality Date  . CARDIAC PACEMAKER PLACEMENT  10/07/14   Procedure: CARDIAC PACEMAKER PLACEMENT  . CARDIAC PACEMAKER PLACEMENT  09/06/14   Procedure: CARDIAC PACEMAKER PLACEMENT; Demand Pacemaker  . CHOLECYSTECTOMY     Procedure: CHOLECYSTECTOMY  . COLONOSCOPY  06/2017   Procedure: COLONOSCOPY; diveticulosis, hemorrhoids  . ESOPHAGOGASTRODUODENOSCOPY N/A 07/30/2017   Procedure: EGD;  Surgeon: Clem Norleen Sero, MD;  Location: [facility] ENDO OR;  Service: Gastroenterology;  Laterality: N/A;  . ESOPHAGOGASTRODUODENOSCOPY  07/2017   Procedure: ESOPHAGOGASTRODUODENOSCOPY; esoph stricture dilated  . HYSTERECTOMY      Procedure: HYSTERECTOMY; AT AGE 64  . KNEE SURGERY Left    Procedure: KNEE SURGERY  . TUBAL LIGATION     Procedure: TUBAL LIGATION  [3]  Current Outpatient Medications:  .  acetaminophen  (TYLENOL ) 500 mg tablet, Take 500 mg by mouth every 6 (six) hours as needed (pain)., Disp: , Rfl:  .  acyclovir  (ZOVIRAX ) 400 mg tablet, Take 400 mg by mouth 2 (two) times a day., Disp: , Rfl:  .  albuterol  HFA (PROVENTIL  HFA;VENTOLIN  HFA;PROAIR  HFA) 90 mcg/actuation inhaler, Inhale 2 puffs every 6 (six) hours as needed for wheezing., Disp: , Rfl:  .  aspirin  81 mg EC tablet, Take 81 mg by mouth daily., Disp: , Rfl:  .  budesonide  (PULMICORT ) 0.5 mg/2 mL nebulizer solution, Inhale 0.5 mg 2 (two) times a day as needed., Disp: , Rfl:  .  cetirizine  (ZyrTEC ) 10 mg tablet, Take 10 mg by mouth daily as needed., Disp: , Rfl:  .  cholecalciferol (VITAMIN D3) 1,000 unit (25 mcg) tablet, Take 1,000 Units by mouth Once Daily., Disp: 90 tablet, Rfl: 3 .  cyclobenzaprine  (FLEXERIL ) 5 mg tablet, Take 5 mg by mouth as needed in the morning and 5 mg as needed in the evening., Disp: , Rfl:  .  diclofenac sodium (VOLTAREN) 1 % gel, Apply 2 g topically as needed (as needed for pain)., Disp: 540 g, Rfl: 0 .  dicyclomine  (BENTYL ) 10 mg capsule, Take 10 mg by mouth every 6 (six) hours as needed.,  Disp: , Rfl:  .  diphenhydrAMINE  (BENADRYL ) 25 mg tablet, Take 25 mg by mouth  as needed in the morning and 25 mg as needed at noon and 25 mg as needed in the evening for allergies., Disp: , Rfl:  .  diphenoxylate -atropine  (LOMOTIL ) 2.5-0.025 mg per tablet, Take 2 tablets by mouth 4 (four) times a day., Disp: 120 tablet, Rfl: 3 .  estradioL  (ESTRACE ) 0.01 % (0.1 mg/gram) vaginal cream, Insert 0.5 g into the vagina daily. (Patient taking differently: Insert 0.5 g into the vagina as needed.), Disp: , Rfl:  .  facial mask misc, . Provide PAP supplies (expires 12 months from 09/21/2023) ., Disp: , Rfl:  .  famotidine  (PEPCID ) 20 mg tablet, Take 1 tablet by mouth at bedtime., Disp: , Rfl:  .  ferrous sulfate 325 mg (65 mg iron ) tablet, Take 325 mg by mouth daily with breakfast., Disp: , Rfl:  .  fluticasone  propionate (FLONASE ) 50 mcg/spray nasal spray, Administer 1 spray into each nostril as needed for allergies., Disp: 16 mL, Rfl: 0 .  furosemide  (LASIX ) 20 mg tablet, Take 1 tablet (20 mg total) by mouth daily as needed (as needed for swelling in legs)., Disp: 90 tablet, Rfl: 0 .  Gemtesa  75 mg tab, Take 75 mg by mouth daily., Disp: , Rfl:  .  hyoscyamine  (LEVSIN /SL) 0.125 mg sublingual tablet, Place 0.125 mg under the tongue every 4 (four) hours as needed., Disp: , Rfl:  .  lidocaine  (XYLOCAINE ) 5 % ointment, Apply 1 Application topically daily as needed., Disp: , Rfl:  .  losartan  (COZAAR ) 50 mg tablet, TAKE ONE (1) TABLET BY MOUTH TWO (2) TIMES DAILY, Disp: 180 tablet, Rfl: 3 .  MAGNESIUM  GLYCINATE ORAL, Take 2 tablets by mouth nightly., Disp: , Rfl:  .  meclizine  (ANTIVERT ) 12.5 mg tablet, Take 12.5 mg by mouth as needed in the morning and 12.5 mg as needed at noon and 12.5 mg as needed in the evening for dizziness., Disp: , Rfl:  .  metoprolol  succinate (TOPROL  XL) 50 mg 24 hr tablet, TAKE ONE (1) TABLET BY MOUTH EVERY DAY, Disp: 90 tablet, Rfl: 3 .  miscellaneous medical supply (C-Tub) misc,  Provide PAP supplies (expires 12 months from 09/16/2022)., Disp: 1 each, Rfl: 11 .  multivitamin/iron /folic acid  (CENTRUM WOMEN ORAL), Take 1 tablet by mouth daily., Disp: , Rfl:  .  phenazopyridine  (PYRIDIUM ) 200 mg tablet, Take 200 mg by mouth in the morning and 200 mg at noon and 200 mg in the evening. Take with meals., Disp: , Rfl:  .  potassium chloride  (KLOR-CON ) 20 mEq ER tablet, Take 20 mEq by mouth 3 (three) times a week., Disp: , Rfl:  .  promethazine  (PHENERGAN ) 25 mg tablet, Take 25 mg by mouth every 8 (eight) hours as needed for nausea., Disp: , Rfl:  .  spironolactone  (ALDACTONE ) 25 mg tablet, Take 1 tablet (25 mg total) by mouth daily., Disp: 90 tablet, Rfl: 3 .  sucralfate  (CARAFATE ) 1 gram tablet, Take 1 g by mouth as needed in the morning and 1 g as needed at noon and 1 g as needed in the evening., Disp: , Rfl:  .  sucralfate  (CARAFATE ) 100 mg/mL oral suspension, Take 1 g by mouth., Disp: , Rfl:  .  nitrofurantoin , macrocrystal-monohydrate, (MACROBID ) 100 mg capsule, Take 100 mg by mouth 2 (two) times a day. (Patient not taking: Reported on 05/18/2024), Disp: , Rfl:  .  saccharomyces boulardii (FLORASTOR) 250 mg capsule, Take 250 mg by mouth daily. (Patient not taking: Reported on 05/18/2024), Disp: , Rfl:  [4] Allergies Allergen Reactions  .  Benazepril  Hives, Anaphylaxis and Swelling    angioedema, Throat and lip swelling  . Codeine GI Intolerance  . Morphine Hives  . Ondansetron  Hcl Hives  [5] Social History Tobacco Use  Smoking Status Never  Smokeless Tobacco Never  [6] Family History Problem Relation Name Age of Onset  . Diabetes Mother    . Hypertension Mother    . Hyperlipidemia Mother    . Heart disease Mother    . Cancer Father         colon  . Glaucoma Neg Hx    . Macular degeneration Neg Hx    . Stroke Neg Hx

## 2024-05-18 NOTE — Patient Instructions (Signed)
 Benefiber, Metamucil or Citracel fiber start daily and increase to twice daily a couple of weeks  Diarrhea, Adult Diarrhea is frequent loose and sometimes watery bowel movements. Diarrhea can make you feel weak and cause you to become dehydrated. Dehydration is a condition in which there is not enough water or other fluids in the body. Dehydration can make you tired and thirsty, cause you to have a dry mouth, and decrease how often you urinate. Diarrhea typically lasts 2-3 days. However, it can last longer if it is a sign of something more serious. It is important to treat your diarrhea as told by your health care provider. Follow these instructions at home: Eating and drinking     Follow these recommendations as told by your health care provider: Take an oral rehydration solution (ORS). This is an over-the-counter medicine that helps return your body to its normal balance of nutrients and water. It is found at pharmacies and retail stores. Drink enough fluid to keep your urine pale yellow. Drink fluids such as water, diluted fruit juice, and low-calorie sports drinks. You can drink milk also, if desired. Sucking on ice chips is another way to get fluids. Avoid drinking fluids that contain a lot of sugar or caffeine, such as soda, energy drinks, and regular sports drinks. Avoid alcohol. Eat bland, easy-to-digest foods in small amounts as you are able. These foods include bananas, applesauce, rice, lean meats, toast, and crackers. Avoid spicy or fatty foods.  Medicines Take over-the-counter and prescription medicines only as told by your health care provider. If you were prescribed antibiotics, take them as told by your health care provider. Do not stop using the antibiotic even if you start to feel better. General instructions  Wash your hands often using soap and water for at least 20 seconds. If soap and water are not available, use hand sanitizer. Others in the household should wash their  hands as well. Hands should be washed: After using the toilet or changing a diaper. Before preparing, cooking, or serving food. While caring for a sick person or while visiting someone in a hospital. Rest at home while you recover. Take a warm bath to relieve any burning or pain from frequent diarrhea episodes. Watch your condition for any changes. Contact a health care provider if: You have a fever. Your diarrhea gets worse. You have new symptoms. You vomit every time you eat or drink. You feel light-headed, dizzy, or have a headache. You have muscle cramps. You have signs of dehydration, such as: Dark urine, very little urine, or no urine. Cracked lips. Dry mouth. Sunken eyes. Sleepiness. Weakness. You have bloody or black stools or stools that look like tar. You have severe pain, cramping, or bloating in your abdomen. Your skin feels cold and clammy. You feel confused. Get help right away if: You have chest pain or your heart is beating very quickly. You have trouble breathing or you are breathing very quickly. You feel extremely weak or you faint. These symptoms may be an emergency. Get help right away. Call 911. Do not wait to see if the symptoms will go away. Do not drive yourself to the hospital. This information is not intended to replace advice given to you by your health care provider. Make sure you discuss any questions you have with your health care provider. Document Revised: 04/14/2022 Document Reviewed: 04/14/2022 Elsevier Patient Education  2024 [employer].

## 2024-05-19 ENCOUNTER — Encounter: Payer: Self-pay | Admitting: Family Medicine

## 2024-05-19 NOTE — Progress Notes (Signed)
 Subjective:    [name], female    DOB: 1955-12-27, 68 y.o.   MRN: [id number]  Chief Complaint  Patient presents with   Quality Metric Gaps    AWV, mammogram, DEXA scan   Abdominal Pain    went to urgent care on the 5th was put on antibiotics  Having stomach, 3 to 4 loose stools a day    HPI Discussed the use of AI scribe software for clinical note transcription with the patient, who gave verbal consent to proceed.  History of Present Illness Tami [name] is a 68 year old female with recurrent UTIs and C. diff who presents with ongoing urinary symptoms and diarrhea.  She visited urgent care on July 5th due to urinary symptoms and was diagnosed with a UTI. Initially prescribed cefdinir , she was later switched to Bactrim  after the culture showed resistance, starting the new medication on July 9th. She also received fluconazole  for vaginitis and Pyridium  for pain, which has provided some relief. She continues to experience urinary frequency, urgency, and burning.  She has a history of C. diff and was hospitalized on June 24th for dehydration and diarrhea. During the hospitalization, she was treated for both C. diff and a UTI, although C. diff was not confirmed. She experiences ongoing diarrhea, with 4-5 episodes per day, and dehydration-related headaches. She received IV fluids recently, which helped temporarily.  She experiences abdominal pain and has undergone multiple scopes without definitive findings. She reports persistent pain and has been using oxycodone  5/325 mg as needed for severe pain. She has not taken Xanax  in over a year.  She reports ear pain in both ears and has requested ear drops. She also notes hair loss, which she attributes to stress and nutritional deficiencies from diarrhea.  She has limited support at home but receives assistance four days a week. She uses a walker and other aids provided by her healthcare team.    Past Medical History:   Diagnosis Date   Abnormal SPEP 07/26/2016   Arthritis    knees, hands   Back pain 07/14/2016   Bowel obstruction (HCC) 11/2014   C. 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   Multiple myeloma (HCC) 07/20/2018   Multiple myeloma not having achieved remission (HCC) 07/20/2018   Myalgia 12/31/2016   Obstructive sleep apnea 03/31/2015   Presence of permanent cardiac pacemaker    SOB (shortness of breath) 04/09/2016   Stroke (HCC)    TIAs   UTI (urinary tract infection)     Past Surgical History:  Procedure Laterality Date   ABDOMINAL HYSTERECTOMY     menorraghia, 2006, total   BIOPSY  09/01/2022   Procedure: BIOPSY;  Surgeon: Charlanne Groom, MD;  Location: [facility];  Service: Gastroenterology;;   CHOLECYSTECTOMY     COLONOSCOPY  2018   [facility] per patient   COLONOSCOPY WITH PROPOFOL  N/A 09/01/2022   Procedure: COLONOSCOPY WITH PROPOFOL ;  Surgeon: Charlanne Groom, MD;  Location: [facility];  Service:  Gastroenterology;  Laterality: N/A;   ESOPHAGOGASTRODUODENOSCOPY  2018   [facility]    ESOPHAGOGASTRODUODENOSCOPY (EGD) WITH PROPOFOL  N/A 09/01/2022   Procedure: ESOPHAGOGASTRODUODENOSCOPY (EGD) WITH PROPOFOL ;  Surgeon: Charlanne Groom, MD;  Location: [facility];  Service: Gastroenterology;  Laterality: N/A;   IR IMAGING GUIDED PORT INSERTION  06/29/2023   KNEE SURGERY     right, repair torn torn cartialage   PACEMAKER INSERTION  08/2014   changed last in 2023 per pt at [facility]   TONSILLECTOMY      TUBAL LIGATION      Family History  Problem Relation Age of Onset   Diabetes Mother    Hypertension Mother    Heart disease Mother        s/p 1 stent   Hyperlipidemia Mother    Arthritis Mother    Colon cancer Father 24   Irritable bowel syndrome Sister    Hypertension Maternal Grandmother    Arthritis Maternal Grandmother    Heart disease Maternal Grandfather        MI   Hypertension Maternal Grandfather    Arthritis Maternal Grandfather    Hyperlipidemia Daughter    Hypertension Daughter    Hypertension Son    Esophageal cancer Neg Hx    Uterine cancer Neg Hx    Pancreatic cancer Neg Hx     Social History   Socioeconomic History   Marital status: Single    Spouse name: [name]   Number of children: 3   Years of education: [name]   Highest education level: High school graduate  Occupational History   [name]  Tobacco Use   Smoking status: Never    Passive exposure: Never   Smokeless tobacco: Never  Vaping Use   Vaping status: Never Used  Substance and Sexual Activity   Alcohol use: No   Drug use: No   Sexual activity: Not Currently    Partners: Male    Birth control/protection: Post-menopausal  Other Topics Concern   [name]  Social History Narrative   Lives at home alone   Retired   Caffeine: coffee   Social Drivers of Health   Financial Resource Strain: Medium Risk (07/08/2023)   Overall Financial Resource Strain (CARDIA)    Difficulty of Paying Living Expenses: Somewhat hard  Food Insecurity: Low Risk  (04/29/2024)   Received from [facility]   Hunger Vital Sign    Within the past 12 months, you worried that your food would run out before you got money to buy more: Never true    Within the past 12 months, the food you bought just didn't last and you didn't have money to get more. : Never true  Transportation Needs: No Transportation Needs (04/29/2024)   Received from [facility]    In the past 12 months, has  lack of reliable transportation kept you from medical appointments, meetings, work or from getting things needed for daily living? : No  Physical Activity: Inactive (07/08/2023)   Exercise Vital Sign    Days of Exercise per Week: 0 days    Minutes of Exercise per Session: 0 min  Stress: No Stress Concern Present (01/21/2023)   [employer] of Occupational Health - Occupational Stress Questionnaire    Feeling of Stress : Not at all  Social Connections: Moderately Isolated (07/08/2023)   Social Connection and Isolation Panel    Frequency of Communication with Friends and Family: More than three times a week  Frequency of Social Gatherings with Friends and Family: Twice a week    Attends Religious Services: More than 4 times per year    Active Member of [employer] or Organizations: No    Attends Banker Meetings: Never    Marital Status: Divorced  Catering manager Violence: Unknown (11/29/2023)   Humiliation, Afraid, Rape, and Kick questionnaire    Fear of Current or Ex-Partner: No    Emotionally Abused: No    Physically Abused: No    Sexually Abused: [name]    Outpatient Medications Prior to Visit  Medication Sig Dispense Refill   acetaminophen  (TYLENOL ) 500 MG tablet Take 500 mg by mouth every 6 (six) hours as needed.     acyclovir  (ZOVIRAX ) 400 MG tablet TAKE ONE (1) TABLET BY MOUTH TWO (2) TIMES DAILY 60 tablet 3   albuterol  (PROVENTIL ) (2.5 MG/3ML) 0.083% nebulizer solution Take 3 mLs (2.5 mg total) by nebulization every 6 (six) hours as needed for wheezing or shortness of breath. 150 mL 2   albuterol  (VENTOLIN  HFA) 108 (90 Base) MCG/ACT inhaler Inhale 2 puffs into the lungs every 6 (six) hours as needed for wheezing or shortness of breath. 18 g 5   amLODipine  (NORVASC ) 5 MG tablet Take 5 mg by mouth daily.     aspirin  EC 81 MG tablet Take 81 mg by mouth daily. Swallow whole.     budesonide  (PULMICORT ) 0.5 MG/2ML nebulizer solution Take 2 mLs (0.5 mg total) by  nebulization 2 (two) times daily as needed (cough, wheezing or shortness of breath). 120 mL 11   cetirizine  (ZYRTEC ) 10 MG tablet Take 10 mg by mouth at bedtime.     Cholecalciferol (VITAMIN D3) 50 MCG (2000 UT) TABS Take 2,000 Units by mouth in the morning.     cholestyramine  light (PREVALITE ) 4 g packet Take 4 g by mouth 2 (two) times daily.     cyclobenzaprine  (FLEXERIL ) 5 MG tablet Take 1 tablet (5 mg total) by mouth 2 (two) times daily as needed for up to 10 doses for muscle spasms. 10 tablet 0   dicyclomine  (BENTYL ) 10 MG capsule Take 1 capsule (10 mg total) by mouth 4 (four) times daily -  before meals and at bedtime. cramping 90 capsule 1   diphenhydrAMINE  (BENADRYL ) 25 MG tablet Take 25 mg by mouth.     diphenoxylate -atropine  (LOMOTIL ) 2.5-0.025 MG tablet Take 1 tablet by mouth 2 (two) times daily. 60 tablet 6   estradiol  (ESTRACE ) 0.1 MG/GM vaginal cream Place 0.5 g vaginally 2 (two) times a week. Place 0.5g nightly for two weeks then twice a week after 30 g 3   famotidine  (PEPCID ) 20 MG tablet Take 1 tablet (20 mg total) by mouth at bedtime. 30 tablet 2   ferrous sulfate (FEROSUL) 325 (65 FE) MG tablet Take 1 tablet (325 mg total) by mouth daily with breakfast. 90 tablet 0   fluconazole  (DIFLUCAN ) 150 MG tablet Take by mouth.     fluticasone  (FLONASE ) 50 MCG/ACT nasal spray Place 2 sprays into both nostrils daily. 16 g 0   furosemide  (LASIX ) 20 MG tablet Take 20 mg by mouth daily as needed (fluid retention (feet swelling)).     hydrOXYzine  (ATARAX ) 10 MG tablet TAKE ONE (1) TABLET BY MOUTH 3 TIMES DAILY AS NEEDED FOR ITCHING 30 tablet 2   hyoscyamine  (LEVSIN  SL) 0.125 MG SL tablet Place 1 tablet (0.125 mg total) under the tongue every 4 (four) hours as needed. 30 tablet 1   lidocaine  (  XYLOCAINE ) 5 % ointment Apply 1 Application topically as needed. 35.44 g 0   lidocaine -prilocaine  (EMLA ) cream Apply 1 Application topically as needed. 30 g 0   loperamide  (IMODIUM ) 2 MG capsule Take 4 mg  by mouth in the morning and at bedtime.     losartan  (COZAAR ) 50 MG tablet Take 50 mg by mouth in the morning and at bedtime.     Magnesium  Glycinate 100 MG CAPS Take 200 mg by mouth at bedtime.     meclizine  (ANTIVERT ) 12.5 MG tablet Take 12.5 mg by mouth 2 (two) times daily as needed for dizziness or nausea.     metoprolol  succinate (TOPROL -XL) 50 MG 24 hr tablet Take 1 tablet (50 mg total) by mouth 2 (two) times daily. 60 tablet 2   metroNIDAZOLE  (METROGEL ) 0.75 % gel Insert One applicatorful (5 g ) vaginally at bedtime for 5 days 45 g 0   montelukast  (SINGULAIR ) 10 MG tablet Take 1 tablet (10 mg total) by mouth at bedtime. Please start taking this on September 4. 30 tablet 3   Multiple Vitamins-Minerals (CENTRUM SILVER  WOMEN 50+ PO) Take 1 tablet by mouth daily at 6 (six) AM.     ondansetron  (ZOFRAN ) 8 MG tablet Take 1 tablet (8 mg total) by mouth every 8 (eight) hours as needed for nausea or vomiting. 30 tablet 1   oxyCODONE -acetaminophen  (PERCOCET) 5-325 MG tablet Take 2 tablets by mouth every 6 (six) hours as needed. 10 tablet 0   pantoprazole  (PROTONIX ) 40 MG tablet Take 1 tablet (40 mg total) by mouth daily. 30 tablet 3   phenazopyridine  (PYRIDIUM ) 200 MG tablet Take 1 tablet (200 mg total) by mouth 3 (three) times daily as needed for pain. 30 tablet 0   potassium chloride  SA (KLOR-CON  M) 20 MEQ tablet TAKE 1 TABLET BY MOUTH EVERY MONDAY, WEDNESDAY, AND FRIDAY. 30 tablet 5   prochlorperazine  (COMPAZINE ) 10 MG tablet Take 0.5 tablets (5 mg total) by mouth 2 (two) times daily as needed for nausea or vomiting. 60 tablet 1   Respiratory Therapy Supplies (PILLOW MASK/ADULT) MISC      scopolamine  (TRANSDERM-SCOP) 1 MG/3DAYS Place 1 patch (1.5 mg total) onto the skin every 3 (three) days. 10 patch 1   sertraline  (ZOLOFT ) 50 MG tablet Take 1 tablet (50 mg total) by mouth daily. 30 tablet 3   spironolactone  (ALDACTONE ) 25 MG tablet Take 25 mg by mouth daily.     sulfamethoxazole -trimethoprim   (BACTRIM  DS) 800-160 MG tablet Take 1 tablet by mouth 2 (two) times daily.     Vibegron  (GEMTESA ) 75 MG TABS Take 1 tablet (75 mg total) by mouth daily. 30 tablet 2   ALPRAZolam  (XANAX ) 0.25 MG tablet Take 1-2 tablets (0.25-0.5 mg total) by mouth 3 (three) times daily as needed for anxiety. 40 tablet 1   celecoxib  (CELEBREX ) 100 MG capsule Take 100 mg by mouth 2 (two) times daily.     fluconazole  (DIFLUCAN ) 100 MG tablet Take 100 mg by mouth daily.     HYDROcodone  bit-homatropine (HYDROMET) 5-1.5 MG/5ML syrup Take 5 mLs by mouth every 8 (eight) hours as needed for cough. 120 mL 0   HYDROcodone -acetaminophen  (NORCO/VICODIN) 5-325 MG tablet Take 1 tablet by mouth every 6 (six) hours as needed for severe pain (pain score 7-10). 10 tablet 0   neomycin -polymyxin-hydrocortisone (CORTISPORIN) OTIC solution Place 4 drops into both ears 4 (four) times daily. 10 mL 4   nortriptyline  (PAMELOR ) 10 MG capsule Take 1 capsule (10 mg total) by mouth at bedtime.  30 capsule 1   Facility-Administered Medications Prior to Visit  Medication Dose Route Frequency Provider Last Rate Last Admin   acetaminophen  (TYLENOL ) tablet 650 mg  650 mg Oral Once Ennever, Peter R, MD       nitroGLYCERIN  (NITROSTAT ) SL tablet 0.4 mg  0.4 mg Sublingual Once Kortney Potvin A, MD       sodium chloride  flush (NS) 0.9 % injection 10 mL  10 mL Intracatheter PRN Timmy Maude SAUNDERS, MD   10 mL at 09/24/23 1640    Allergies  Allergen Reactions   Benazepril  Anaphylaxis, Swelling and Hives    angioedema Throat and lip swelling   Ondansetron  Hcl Hives    Redness and hives post IV admin on 07/05/17   Codeine Nausea And Vomiting   Morphine Hives    Redness and hives noted post IV admin on 07/05/17    Review of Systems  Constitutional:  Positive for malaise/fatigue. Negative for fever.  HENT:  Positive for ear pain. Negative for congestion and ear discharge.   Eyes:  Negative for blurred vision.  Respiratory:  Negative for shortness of  breath.   Cardiovascular:  Negative for chest pain, palpitations and leg swelling.  Gastrointestinal:  Positive for abdominal pain and diarrhea. Negative for blood in stool and nausea.  Genitourinary:  Positive for frequency. Negative for dysuria.  Musculoskeletal:  Negative for falls.  Skin:  Negative for rash.  Neurological:  Negative for dizziness, loss of consciousness and headaches.  Endo/Heme/Allergies:  Negative for environmental allergies.  Psychiatric/Behavioral:  Negative for depression. The patient is not nervous/anxious.        Objective:    Physical Exam Constitutional:      General: She is not in acute distress.    Appearance: Normal appearance. She is well-developed. She is not toxic-appearing.  HENT:     Head: Normocephalic and atraumatic.     Right Ear: External ear normal.     Left Ear: External ear normal.     Nose: Nose normal.  Eyes:     General:        Right eye: No discharge.        Left eye: No discharge.     Conjunctiva/sclera: Conjunctivae normal.  Neck:     Thyroid : No thyromegaly.  Cardiovascular:     Rate and Rhythm: Normal rate and regular rhythm.     Heart sounds: Normal heart sounds. No murmur heard. Pulmonary:     Effort: Pulmonary effort is normal. No respiratory distress.     Breath sounds: Normal breath sounds.  Abdominal:     General: Bowel sounds are normal.     Palpations: Abdomen is soft.     Tenderness: There is no abdominal tenderness. There is no guarding.  Musculoskeletal:        General: Normal range of motion.     Cervical back: Neck supple.  Lymphadenopathy:     Cervical: No cervical adenopathy.  Skin:    General: Skin is warm and dry.  Neurological:     Mental Status: She is alert and oriented to person, place, and time.  Psychiatric:        Mood and Affect: Mood normal.        Behavior: Behavior normal.        Thought Content: Thought content normal.        Judgment: Judgment normal.     BP 130/80 (BP Location:  Left Arm, Patient Position: Sitting)   Pulse 76   Resp 17  Ht 5' 5 (1.651 m)   Wt 193 lb 3.2 oz (87.6 kg)   SpO2 93%   BMI 32.15 kg/m  Wt Readings from Last 3 Encounters:  05/18/24 193 lb 3.2 oz (87.6 kg)  04/25/24 194 lb 6.4 oz (88.2 kg)  04/21/24 194 lb 6.4 oz (88.2 kg)    Diabetic Foot Exam - Simple   No data filed    Lab Results  Component Value Date   WBC 3.4 (L) 05/18/2024   HGB 12.6 05/18/2024   HCT 38.7 05/18/2024   PLT 141.0 (L) 05/18/2024   GLUCOSE 104 (H) 05/18/2024   CHOL 195 05/18/2024   TRIG 92.0 05/18/2024   HDL 55.70 05/18/2024   LDLCALC 121 (H) 05/18/2024   ALT 18 05/18/2024   AST 23 05/18/2024   NA 140 05/18/2024   K 4.5 05/18/2024   CL 105 05/18/2024   CREATININE 0.75 05/18/2024   BUN 13 05/18/2024   CO2 28 05/18/2024   TSH 1.45 05/18/2024   INR 0.90 07/28/2018   HGBA1C 5.7 05/18/2024    Lab Results  Component Value Date   TSH 1.45 05/18/2024   Lab Results  Component Value Date   WBC 3.4 (L) 05/18/2024   HGB 12.6 05/18/2024   HCT 38.7 05/18/2024   MCV 91.3 05/18/2024   PLT 141.0 (L) 05/18/2024   Lab Results  Component Value Date   NA 140 05/18/2024   K 4.5 05/18/2024   CHLORIDE 108 12/31/2016   CO2 28 05/18/2024   GLUCOSE 104 (H) 05/18/2024   BUN 13 05/18/2024   CREATININE 0.75 05/18/2024   BILITOT 0.6 05/18/2024   ALKPHOS 48 05/18/2024   AST 23 05/18/2024   ALT 18 05/18/2024   PROT 6.8 05/18/2024   ALBUMIN 4.4 05/18/2024   CALCIUM 9.5 05/18/2024   ANIONGAP 13 04/25/2024   EGFR 96 03/15/2024   GFR 80.92 05/18/2024   Lab Results  Component Value Date   CHOL 195 05/18/2024   Lab Results  Component Value Date   HDL 55.70 05/18/2024   Lab Results  Component Value Date   LDLCALC 121 (H) 05/18/2024   Lab Results  Component Value Date   TRIG 92.0 05/18/2024   Lab Results  Component Value Date   CHOLHDL 3 05/18/2024   Lab Results  Component Value Date   HGBA1C 5.7 05/18/2024       Assessment & Plan:   Abdominal pain, unspecified abdominal location Assessment & Plan: Recently hospitalized with abdominal pain crisis  Orders: -     TSH  Hyperglycemia Assessment & Plan: hgba1c acceptable, minimize simple carbs. Increase exercise as tolerated.   Orders: -     Comprehensive metabolic panel with GFR -     Hemoglobin A1c  Hyperlipidemia, unspecified hyperlipidemia type Assessment & Plan: Encourage heart healthy diet such as MIND or DASH diet, increase exercise, avoid trans fats, simple carbohydrates and processed foods, consider a krill or fish or flaxseed oil cap daily.   Orders: -     Lipid panel  Primary hypertension Assessment & Plan: Well controlled, no changes to meds. Encouraged heart healthy diet such as the DASH diet and exercise as tolerated.    Orders: -     TSH  Ear pain, bilateral -     CBC with Differential/Platelet  Other orders -     Sucralfate ; Take 10 mLs (1 g total) by mouth 4 (four) times daily -  with meals and at bedtime.  Dispense: 473 mL; Refill: 1 -  Neomycin -Polymyxin-HC; Place 4 drops into both ears 4 (four) times daily.  Dispense: 10 mL; Refill: 4    Assessment and Plan Assessment & Plan Urinary Tract Infection (UTI) Cefdinir  ineffective due to resistance. Switched to Bactrim . Pyridium  prescribed for pain. - Continue Bactrim  as prescribed. - Monitor symptoms for improvement. - Use Pyridium  for pain relief as needed.  Chronic Diarrhea Chronic diarrhea with dehydration. No C. difficile. Possible low-grade ulcer. Benefiber recommended. - Start Benefiber once daily, increase to twice daily. - Prescribe liquid Carafate  10 mL four times daily, switch to tablets if not covered for recurrent epigastric pain - Consider cholestyramine  if Benefiber insufficient. - Monitor for improvement in diarrhea and dehydration.  Abdominal Pain Persistent pain, possible low-grade ulcer. Carafate  prescribed. - Prescribe liquid Carafate . - Monitor for  improvement. - Switch to tablets if liquid not covered.  Ear Pain Bilateral ear pain with redness. Ear drops prescribed. - Order ear drops for pain relief and inflammation.  Medication Management Discontinued hydrocodone , alprazolam , and cough syrup. Oxycodone  for severe pain only. - Remove hydrocodone , alprazolam , and cough syrup from medication list. - Continue oxycodone  as needed, avoid concurrent use with alprazolam .  Follow-up Care coordination discussed. Upcoming cardiology appointment. - Attend cardiology appointment at 2 PM today.     Harlene Horton, MD

## 2024-05-19 NOTE — Progress Notes (Signed)
Patient reviewed via MyChart.

## 2024-05-20 ENCOUNTER — Other Ambulatory Visit: Payer: Self-pay

## 2024-05-20 ENCOUNTER — Emergency Department (HOSPITAL_BASED_OUTPATIENT_CLINIC_OR_DEPARTMENT_OTHER)
Admission: EM | Admit: 2024-05-20 | Discharge: 2024-05-20 | Disposition: A | Discharge status: Home / Self Care | Attending: Emergency Medicine | Admitting: Emergency Medicine

## 2024-05-20 ENCOUNTER — Encounter (HOSPITAL_BASED_OUTPATIENT_CLINIC_OR_DEPARTMENT_OTHER): Payer: Self-pay

## 2024-05-20 ENCOUNTER — Emergency Department (HOSPITAL_BASED_OUTPATIENT_CLINIC_OR_DEPARTMENT_OTHER)

## 2024-05-20 DIAGNOSIS — R112 Nausea with vomiting, unspecified: Secondary | ICD-10-CM | POA: Insufficient documentation

## 2024-05-20 DIAGNOSIS — R197 Diarrhea, unspecified: Secondary | ICD-10-CM | POA: Diagnosis not present

## 2024-05-20 DIAGNOSIS — R109 Unspecified abdominal pain: Secondary | ICD-10-CM | POA: Diagnosis not present

## 2024-05-20 DIAGNOSIS — Z8673 Personal history of transient ischemic attack (TIA), and cerebral infarction without residual deficits: Secondary | ICD-10-CM | POA: Insufficient documentation

## 2024-05-20 DIAGNOSIS — Z8579 Personal history of other malignant neoplasms of lymphoid, hematopoietic and related tissues: Secondary | ICD-10-CM | POA: Diagnosis not present

## 2024-05-20 DIAGNOSIS — G8929 Other chronic pain: Secondary | ICD-10-CM | POA: Diagnosis not present

## 2024-05-20 DIAGNOSIS — I110 Hypertensive heart disease with heart failure: Secondary | ICD-10-CM | POA: Insufficient documentation

## 2024-05-20 DIAGNOSIS — R1084 Generalized abdominal pain: Secondary | ICD-10-CM | POA: Diagnosis not present

## 2024-05-20 DIAGNOSIS — N281 Cyst of kidney, acquired: Secondary | ICD-10-CM | POA: Diagnosis not present

## 2024-05-20 DIAGNOSIS — Z79899 Other long term (current) drug therapy: Secondary | ICD-10-CM | POA: Insufficient documentation

## 2024-05-20 DIAGNOSIS — K7689 Other specified diseases of liver: Secondary | ICD-10-CM | POA: Diagnosis not present

## 2024-05-20 DIAGNOSIS — I509 Heart failure, unspecified: Secondary | ICD-10-CM | POA: Diagnosis not present

## 2024-05-20 DIAGNOSIS — C9000 Multiple myeloma not having achieved remission: Secondary | ICD-10-CM | POA: Diagnosis not present

## 2024-05-20 DIAGNOSIS — R1013 Epigastric pain: Secondary | ICD-10-CM | POA: Diagnosis not present

## 2024-05-20 LAB — URINE DRUG SCREEN
Amphetamines: NOT DETECTED
Barbiturates: NOT DETECTED
Benzodiazepines: NOT DETECTED
Cocaine: NOT DETECTED
Fentanyl: DETECTED — AB
Methadone Scn, Ur: NOT DETECTED
Opiates: NOT DETECTED
Tetrahydrocannabinol: NOT DETECTED

## 2024-05-20 LAB — CBC
HCT: 40.2 % (ref 36.0–46.0)
Hemoglobin: 13 g/dL (ref 12.0–15.0)
MCH: 29.6 pg (ref 26.0–34.0)
MCHC: 32.3 g/dL (ref 30.0–36.0)
MCV: 91.6 fL (ref 80.0–100.0)
Platelets: 159 K/uL (ref 150–400)
RBC: 4.39 MIL/uL (ref 3.87–5.11)
RDW: 12.6 % (ref 11.5–15.5)
WBC: 4.1 K/uL (ref 4.0–10.5)
nRBC: 0 % (ref 0.0–0.2)

## 2024-05-20 LAB — COMPREHENSIVE METABOLIC PANEL WITH GFR
ALT: 32 U/L (ref 0–44)
AST: 51 U/L — ABNORMAL HIGH (ref 15–41)
Albumin: 4.6 g/dL (ref 3.5–5.0)
Alkaline Phosphatase: 59 U/L (ref 38–126)
Anion gap: 13 (ref 5–15)
BUN: 17 mg/dL (ref 8–23)
CO2: 24 mmol/L (ref 22–32)
Calcium: 10.1 mg/dL (ref 8.9–10.3)
Chloride: 105 mmol/L (ref 98–111)
Creatinine, Ser: 0.99 mg/dL (ref 0.44–1.00)
GFR, Estimated: >60 mL/min (ref >60)
Glucose, Bld: 124 mg/dL — ABNORMAL HIGH (ref 70–99)
Potassium: 4.7 mmol/L (ref 3.5–5.1)
Sodium: 141 mmol/L (ref 135–145)
Total Bilirubin: 0.6 mg/dL (ref 0.0–1.2)
Total Protein: 7.4 g/dL (ref 6.5–8.1)

## 2024-05-20 LAB — URINALYSIS, ROUTINE W REFLEX MICROSCOPIC
Bilirubin Urine: NEGATIVE
Glucose, UA: NEGATIVE mg/dL
Hgb urine dipstick: NEGATIVE
Ketones, ur: NEGATIVE mg/dL
Leukocytes,Ua: NEGATIVE
Nitrite: NEGATIVE
Protein, ur: NEGATIVE mg/dL
Specific Gravity, Urine: 1.015 (ref 1.005–1.030)
pH: 6 (ref 5.0–8.0)

## 2024-05-20 LAB — TROPONIN T, HIGH SENSITIVITY
Troponin T High Sensitivity: 16 ng/L (ref <19)
Troponin T High Sensitivity: 15 ng/L (ref <19)

## 2024-05-20 LAB — LIPASE, BLOOD: Lipase: 26 U/L (ref 11–51)

## 2024-05-20 MED ORDER — METOCLOPRAMIDE HCL 5 MG/ML IJ SOLN
10.0000 mg | Freq: Once | INTRAMUSCULAR | Status: AC
  Administered 2024-05-20: 10 mg via INTRAVENOUS
  Filled 2024-05-20: qty 2

## 2024-05-20 MED ORDER — FENTANYL CITRATE PF 50 MCG/ML IJ SOSY
50.0000 ug | PREFILLED_SYRINGE | Freq: Once | INTRAMUSCULAR | Status: AC
  Administered 2024-05-20: 50 ug via INTRAVENOUS
  Filled 2024-05-20: qty 1

## 2024-05-20 MED ORDER — SODIUM CHLORIDE 0.9 % IV BOLUS
500.0000 mL | Freq: Once | INTRAVENOUS | Status: AC
  Administered 2024-05-20: 500 mL via INTRAVENOUS

## 2024-05-20 MED ORDER — IOHEXOL 350 MG/ML SOLN
100.0000 mL | Freq: Once | INTRAVENOUS | Status: AC | PRN
  Administered 2024-05-20: 100 mL via INTRAVENOUS

## 2024-05-20 NOTE — ED Provider Notes (Signed)
 [place] [facility]PARTMENT AT [facility] Provider Note   CSN: [id number] Arrival date & time: 05/20/24  1049     Patient presents with: Abdominal Pain   Tami [name] is a 68 y.o. female.   Patient with history of PMH of recurrent C. 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 diff,  EGD and colonoscopy in February 2025 showed hemorrhoids and diverticulosis but no significant abnormalities and biopsies collected then negative for celiac and negative for histopathologic changes --presents to the emergency department today for evaluation of recurrent abdominal pain.  Pain is epigastric in nature.  Patient states that it worsened 4 days ago.  She did see her PCP and cardiologist 2 days ago, no mention in those notes in regards to acute flare of symptoms.  Patient has had some vomiting, last episode on arrival to the emergency department.  Diarrhea is nonbloody, watery.       Prior to Admission medications   Medication Sig Start Date End Date Taking? Authorizing Provider  acetaminophen  (TYLENOL ) 500 MG tablet Take 500 mg by mouth every 6 (six) hours as needed.    [provider]  acyclovir  (ZOVIRAX ) 400 MG tablet TAKE ONE (1) TABLET BY MOUTH TWO (2) TIMES DAILY 02/08/24   Timmy Maude SAUNDERS, MD  albuterol  (PROVENTIL ) (2.5 MG/3ML) 0.083% nebulizer solution Take 3 mLs (2.5 mg total) by nebulization every 6 (six) hours as needed for wheezing or shortness of breath. 12/14/23   Kara Dorn NOVAK, MD  albuterol  (VENTOLIN  HFA) 108 (90 Base) MCG/ACT inhaler Inhale 2 puffs into the lungs every 6 (six) hours as needed for wheezing or shortness of breath. 12/14/23   Kara Dorn NOVAK,  MD  amLODipine  (NORVASC ) 5 MG tablet Take 5 mg by mouth daily. 09/14/23   [provider]  aspirin  EC 81 MG tablet Take 81 mg by mouth daily. Swallow whole.    [provider]  budesonide  (PULMICORT ) 0.5 MG/2ML nebulizer solution Take 2 mLs (0.5 mg total) by nebulization 2 (two) times daily as needed (cough, wheezing or shortness of breath). 12/14/23   Kara Dorn NOVAK, MD  cetirizine  (ZYRTEC ) 10 MG tablet Take 10 mg by mouth at bedtime.    [provider]  Cholecalciferol (VITAMIN D3) 50 MCG (2000 UT) TABS Take 2,000 Units by mouth in the morning.    [provider]  cholestyramine  light (PREVALITE ) 4 g packet Take 4 g by mouth 2 (two) times daily.    [provider]  cyclobenzaprine  (FLEXERIL ) 5 MG tablet Take 1 tablet (5 mg total) by mouth 2 (two) times daily as needed for up to 10 doses for muscle spasms. 01/03/24   Curatolo, Adam, DO  dicyclomine  (BENTYL ) 10 MG capsule Take 1 capsule (10 mg total) by mouth 4 (four) times daily -  before meals and at bedtime. cramping 12/02/23 05/18/24  Domenica Harlene DELENA, MD  diphenhydrAMINE  (BENADRYL ) 25 MG tablet Take 25 mg by mouth. 01/28/22   [provider]  diphenoxylate -atropine  (LOMOTIL ) 2.5-0.025 MG tablet Take 1 tablet by mouth 2 (two) times daily. 01/24/24   Domenica Harlene DELENA, MD  estradiol  (ESTRACE ) 0.1 MG/GM vaginal cream Place 0.5 g vaginally 2 (two) times a week. Place 0.5g nightly for two weeks then twice a week after 02/03/24  Guadlupe Dull T, MD  famotidine  (PEPCID ) 20 MG tablet Take 1 tablet (20 mg total) by mouth at bedtime. 09/16/23   Domenica Harlene LABOR, MD  ferrous sulfate (FEROSUL) 325 (65 FE) MG tablet Take 1 tablet (325 mg total) by mouth daily with breakfast. 01/19/24   Domenica Harlene LABOR, MD  fluconazole  (DIFLUCAN ) 150 MG tablet Take by mouth. 05/13/24   [provider]  fluticasone  (FLONASE ) 50 MCG/ACT nasal spray Place 2 sprays into both nostrils daily. 01/07/20   Joy, Shawn C, PA-C  furosemide   (LASIX ) 20 MG tablet Take 20 mg by mouth daily as needed (fluid retention (feet swelling)). 07/03/20   [provider]  hydrOXYzine  (ATARAX ) 10 MG tablet TAKE ONE (1) TABLET BY MOUTH 3 TIMES DAILY AS NEEDED FOR ITCHING 03/03/23   Domenica Harlene LABOR, MD  hyoscyamine  (LEVSIN  SL) 0.125 MG SL tablet Place 1 tablet (0.125 mg total) under the tongue every 4 (four) hours as needed. 01/24/24   Domenica Harlene LABOR, MD  lidocaine  (XYLOCAINE ) 5 % ointment Apply 1 Application topically as needed. 01/31/24   Guadlupe Dull DASEN, MD  lidocaine -prilocaine  (EMLA ) cream Apply 1 Application topically as needed. 07/01/23   Timmy Maude SAUNDERS, MD  loperamide  (IMODIUM ) 2 MG capsule Take 4 mg by mouth in the morning and at bedtime.    [provider]  losartan  (COZAAR ) 50 MG tablet Take 50 mg by mouth in the morning and at bedtime. 04/14/21   [provider]  Magnesium  Glycinate 100 MG CAPS Take 200 mg by mouth at bedtime. 09/21/22   [provider]  meclizine  (ANTIVERT ) 12.5 MG tablet Take 12.5 mg by mouth 2 (two) times daily as needed for dizziness or nausea. 01/28/22   [provider]  metoprolol  succinate (TOPROL -XL) 50 MG 24 hr tablet Take 1 tablet (50 mg total) by mouth 2 (two) times daily. 02/23/23   Domenica Harlene LABOR, MD  metroNIDAZOLE  (METROGEL ) 0.75 % gel Insert One applicatorful (5 g ) vaginally at bedtime for 5 days 02/28/24   Drubel, Manuelita, PA-C  montelukast  (SINGULAIR ) 10 MG tablet Take 1 tablet (10 mg total) by mouth at bedtime. Please start taking this on September 4. 07/01/23   Timmy Maude SAUNDERS, MD  Multiple Vitamins-Minerals (CENTRUM SILVER  WOMEN 50+ PO) Take 1 tablet by mouth daily at 6 (six) AM.    [provider]  neomycin -polymyxin-hydrocortisone (CORTISPORIN) OTIC solution Place 4 drops into both ears 4 (four) times daily. 05/18/24   Domenica Harlene LABOR, MD  ondansetron  (ZOFRAN ) 8 MG tablet Take 1 tablet (8 mg total) by mouth every 8 (eight) hours as needed for nausea or vomiting.  07/14/23   Timmy Maude SAUNDERS, MD  oxyCODONE -acetaminophen  (PERCOCET) 5-325 MG tablet Take 2 tablets by mouth every 6 (six) hours as needed. 08/07/23   Patt Alm Macho, MD  pantoprazole  (PROTONIX ) 40 MG tablet Take 1 tablet (40 mg total) by mouth daily. 09/03/23   O'Sullivan, Melissa, NP  phenazopyridine  (PYRIDIUM ) 200 MG tablet Take 1 tablet (200 mg total) by mouth 3 (three) times daily as needed for pain. 01/31/24   Guadlupe Dull DASEN, MD  potassium chloride  SA (KLOR-CON  M) 20 MEQ tablet TAKE 1 TABLET BY MOUTH EVERY MONDAY, WEDNESDAY, AND FRIDAY. 10/25/23   Domenica Harlene LABOR, MD  prochlorperazine  (COMPAZINE ) 10 MG tablet Take 0.5 tablets (5 mg total) by mouth 2 (two) times daily as needed for nausea or vomiting. 12/02/23   Domenica Harlene LABOR, MD  Respiratory Therapy Supplies (PILLOW MASK/ADULT) MISC  09/29/23  [provider]  scopolamine  (TRANSDERM-SCOP) 1 MG/3DAYS Place 1 patch (1.5 mg total) onto the skin every 3 (three) days. 07/01/23   Timmy Maude SAUNDERS, MD  sertraline  (ZOLOFT ) 50 MG tablet Take 1 tablet (50 mg total) by mouth daily. 06/14/23   Domenica Harlene LABOR, MD  spironolactone  (ALDACTONE ) 25 MG tablet Take 25 mg by mouth daily.    [provider]  sucralfate  (CARAFATE ) 1 GM/10ML suspension Take 10 mLs (1 g total) by mouth 4 (four) times daily -  with meals and at bedtime. 05/18/24   Domenica Harlene LABOR, MD  sulfamethoxazole -trimethoprim  (BACTRIM  DS) 800-160 MG tablet Take 1 tablet by mouth 2 (two) times daily. 05/17/24   [provider]  Vibegron  (GEMTESA ) 75 MG TABS Take 1 tablet (75 mg total) by mouth daily. 01/31/24   Guadlupe Lianne DASEN, MD    Allergies: Benazepril , Ondansetron  hcl, Codeine, and Morphine    Review of Systems  Updated Vital Signs BP (!) 154/81 (BP Location: Left Arm)   Pulse 90   Temp 98.4 F (36.9 C) (Oral)   Resp 18   Ht 5' 5 (1.651 m)   Wt 87.6 kg   SpO2 98%   BMI 32.15 kg/m   Physical Exam Vitals and nursing note reviewed.  Constitutional:      General:  She is in acute distress.     Appearance: She is well-developed.     Comments: Appears uncomfortable, actively vomiting.  HENT:     Head: Normocephalic and atraumatic.     Right Ear: External ear normal.     Left Ear: External ear normal.     Nose: Nose normal.  Eyes:     Conjunctiva/sclera: Conjunctivae normal.  Cardiovascular:     Rate and Rhythm: Normal rate and regular rhythm.     Heart sounds: No murmur heard. Pulmonary:     Effort: No respiratory distress.     Breath sounds: No wheezing, rhonchi or rales.  Abdominal:     Palpations: Abdomen is soft.     Tenderness: There is generalized abdominal tenderness. There is no guarding or rebound. Negative signs include Murphy's sign and McBurney's sign.  Musculoskeletal:     Cervical back: Normal range of motion and neck supple.     Right lower leg: No edema.     Left lower leg: No edema.  Skin:    General: Skin is warm and dry.     Findings: No rash.  Neurological:     General: No focal deficit present.     Mental Status: She is alert. Mental status is at baseline.     Motor: No weakness.  Psychiatric:        Mood and Affect: Mood normal.     (all labs ordered are listed, but only abnormal results are displayed) Labs Reviewed  COMPREHENSIVE METABOLIC PANEL WITH GFR - Abnormal; Notable for the following components:      Result Value   Glucose, Bld 124 (*)    AST 51 (*)    All other components within normal limits  URINE DRUG SCREEN - Abnormal; Notable for the following components:   Fentanyl  DETECTED (*)    All other components within normal limits  LIPASE, BLOOD  CBC  URINALYSIS, ROUTINE W REFLEX MICROSCOPIC  TROPONIN T, HIGH SENSITIVITY  TROPONIN T, HIGH SENSITIVITY   ED ECG REPORT   Date: 05/20/2024  Rate: 82  Rhythm: normal sinus rhythm  QRS Axis: left  Intervals: normal  ST/T Wave abnormalities: nonspecific T  wave changes  Conduction Disutrbances:nonspecific intraventricular conduction delay  Narrative  Interpretation:   Old EKG Reviewed: unchanged from 02/2024  I have personally reviewed the EKG tracing and agree with the computerized printout as noted.   Radiology: CT Angio Abdomen W and/or Wo Contrast Result Date: 05/20/2024 CLINICAL DATA:  Severe abdominal pain, vomiting and diarrhea for the past 4 days. Clinical concern for possible mesenteric ischemia. Multiple myeloma. EXAM: CT ANGIOGRAPHY ABDOMEN TECHNIQUE: Multidetector CT imaging of the abdomen was performed using the standard protocol during bolus administration of intravenous contrast. Multiplanar reconstructed images and MIPs were obtained and reviewed to evaluate the vascular anatomy. RADIATION DOSE REDUCTION: This exam was performed according to the departmental dose-optimization program which includes automated exposure control, adjustment of the mA and/or kV according to patient size and/or use of iterative reconstruction technique. CONTRAST:  OMNIPAQUE  IOHEXOL  350 MG/ML SOLN COMPARISON:  Abdomen and pelvis CT dated 03/02/2024. FINDINGS: VASCULAR Aorta: Normal caliber aorta without aneurysm, dissection, vasculitis or significant stenosis. Celiac: Patent without evidence of aneurysm, dissection, vasculitis or significant stenosis. SMA: Patent without evidence of aneurysm, dissection, vasculitis or significant stenosis. Renals: Both renal arteries are patent without evidence of aneurysm, dissection, vasculitis, fibromuscular dysplasia or significant stenosis. IMA: Patent without evidence of aneurysm, dissection, vasculitis or significant stenosis. Inflow: Patent without evidence of aneurysm, dissection, vasculitis or significant stenosis. Veins: No obvious venous abnormality within the limitations of this arterial phase study. Review of the MIP images confirms the above findings. NON-VASCULAR Lower chest: Borderline enlarged heart. No pericardial effusion. Stable minimal linear scarring at both lung bases. Hepatobiliary: Stable small  liver cysts.  Cholecystectomy clips. Pancreas: Unremarkable. No pancreatic ductal dilatation or surrounding inflammatory changes. Spleen: Normal in size without focal abnormality. Adrenals/Urinary Tract: Previously demonstrated simple appearing bilateral renal cysts have not changed significantly. These do not need imaging follow-up. Unremarkable adrenal glands and included portions of the ureters. Stomach/Bowel: Unremarkable stomach and included bowel. Lymphatic: No significant vascular findings are present. No enlarged abdominal or pelvic lymph nodes. Other: No abdominal wall hernia or abnormality. No abdominopelvic ascites. Musculoskeletal: Facet degenerative changes in the lower lumbar spine without significant change in grade 1 anterolisthesis at the L4-5 level. No pars defects or fractures seen. IMPRESSION: 1. No evidence of mesenteric ischemia. 2. No acute abnormality. 3. Borderline cardiomegaly. Electronically Signed   By: Elspeth Bathe M.D.   On: 05/20/2024 13:49     Procedures   Medications Ordered in the ED  metoCLOPramide  (REGLAN ) injection 10 mg (10 mg Intravenous Given 05/20/24 1235)  sodium chloride  0.9 % bolus 500 mL (500 mLs Intravenous New Bag/Given 05/20/24 1240)  fentaNYL  (SUBLIMAZE ) injection 50 mcg (50 mcg Intravenous Given 05/20/24 1235)   ED Course  Patient seen and examined. History obtained directly from patient.  Reviewed outpatient admission notes, heme-onc notes, cardiology note, PCP note.  Also obtained additional information over the phone with patient's power of attorney.  Labs/EKG: Ordered CBC normal white blood cell count and hemoglobin otherwise unremarkable; CMP unremarkable; lipase normal.  Added cardiac markers, UDS.  Imaging: Ordered CT angio of the abdomen and pelvis to evaluate for signs of ischemia as potential etiology of symptoms given her comorbidities.  Most recent CT scan from June 2025 at [facility] without any emergent findings.  Medications/Fluids:  Ordered: Reglan , morphine.   Most recent vital signs reviewed and are as follows: BP (!) 154/81 (BP Location: Left Arm)   Pulse 90   Temp 98.4 F (36.9 C) (Oral)   Resp 18  Ht 5' 5 (1.651 m)   Wt 87.6 kg   SpO2 98%   BMI 32.15 kg/m   Initial impression: Patient with acute on chronic abdominal pain and diarrhea, also with vomiting.  Will rule out cardiac etiology.  Patient discussed with Dr. Zavitz who will see.  Will reimage given the fact that patient describes current symptoms as being different than previous.  She has not had angiography recently and will rule out potential for ischemic related symptoms with that.  3:09 PM Reassessment performed. Patient appears stable.  She states that she is feeling better.  Nausea is currently resolved.  Labs personally reviewed and interpreted including: Troponin was normal at 16.  Awaiting urine testing.  Imaging personally visualized and interpreted including: CT angio of the abdomen and pelvis, no significant findings.  Reviewed pertinent lab work and imaging with patient at bedside. Questions answered.   Most current vital signs reviewed and are as follows: BP (!) 176/101   Pulse 77   Temp 97.8 F (36.6 C) (Oral)   Resp (!) 21   Ht 5' 5 (1.651 m)   Wt 87.6 kg   SpO2 93%   BMI 32.15 kg/m   Plan: Will p.o. challenge, awaiting urine results.  At this time, no indications for admission.  Will focus on symptom control and outpatient follow-up.  4:36 PM Reassessment performed. Patient appears stable.  Labs personally reviewed and interpreted including: UDS positive for fentanyl , obtained after given fentanyl  for pain control here.  UA not compelling for infection.  Reviewed pertinent lab work and imaging with patient at bedside. Questions answered.   Most current vital signs reviewed and are as follows: BP (!) 176/101   Pulse 77   Temp 97.8 F (36.6 C) (Oral)   Resp (!) 21   Ht 5' 5 (1.651 m)   Wt 87.6 kg   SpO2 93%   BMI  32.15 kg/m   Plan: Discharge to home.   Prescriptions written for: None, patient reports having medication for nausea at home  Other home care instructions discussed: Benjamine diet, maintain good hydration  ED return instructions discussed: The patient was urged to return to the Emergency Department immediately with worsening of current symptoms, worsening abdominal pain, persistent vomiting, blood noted in stools, fever, or any other concerns. The patient verbalized understanding.   Follow-up instructions discussed: Patient encouraged to follow-up with their PCP in 3 days.                                   Medical Decision Making Amount and/or Complexity of Data Reviewed Labs: ordered. Radiology: ordered.  Risk Prescription drug management.   For this patient's complaint of abdominal pain, the following conditions were considered on the differential diagnosis: gastritis/PUD, enteritis/duodenitis, appendicitis, cholelithiasis/cholecystitis, cholangitis, pancreatitis, ruptured viscus, colitis, diverticulitis, small/large bowel obstruction, proctitis, cystitis, pyelonephritis, ureteral colic, aortic dissection, aortic aneurysm. Atypical chest etiologies were also considered including ACS, PE, and pneumonia.  This has been a recurrent, acute on chronic problem for the patient.  Unfortunately, no clear cause of her symptoms have been elicited to this point despite extensive outpatient workup.  Further evaluation today with CT angiography and cardiac workup performed.  Fortunately this was negative.  Labs overall are very reassuring.  Patient vomiting on arrival, improved with treatment and resolved.  Patient looks much better now.  The patient's vital signs, pertinent lab work and imaging were reviewed and interpreted as  discussed in the ED course. Hospitalization was considered for further testing, treatments, or serial exams/observation. However as patient is well-appearing, has a stable  exam, and reassuring studies today, I do not feel that they warrant admission at this time. This plan was discussed with the patient who verbalizes agreement and comfort with this plan and seems reliable and able to return to the Emergency Department with worsening or changing symptoms.         Final diagnoses:  Nausea vomiting and diarrhea  Chronic abdominal pain    ED Discharge Orders     None          Desiderio Chew, PA-C 05/20/24 1638    Tonia Chew, MD 05/21/24 1221

## 2024-05-20 NOTE — Discharge Instructions (Addendum)
 Please read and follow all provided instructions.  Your diagnoses today include:  1. Nausea vomiting and diarrhea   2. Chronic abdominal pain     Tests performed today include: Complete blood cell count: Normal, no problems Complete metabolic panel: Normal Lipase (pancreas function test): Normal Urinalysis (urine test): No signs of infection Blood test for stress in the heart: Was normal CT of the abdomen pelvis, specifically looking at the blood vessels in the abdomen and pelvis: Did not show any problems today, or other causes of your symptoms Vital signs. See below for your results today.   Medications prescribed:  None  Take any prescribed medications only as directed.  Home care instructions:  Follow any educational materials contained in this packet.  Follow-up instructions: Please follow-up with your primary care provider in the next 3 days for further evaluation of your symptoms.    Return instructions:  SEEK IMMEDIATE MEDICAL ATTENTION IF: The pain does not go away or becomes severe  A temperature above 101F develops  Repeated vomiting occurs (multiple episodes)  The pain becomes localized to portions of the abdomen. The right side could possibly be appendicitis. In an adult, the left lower portion of the abdomen could be colitis or diverticulitis.  Blood is being passed in stools or vomit (bright red or black tarry stools)  You develop chest pain, difficulty breathing, dizziness or fainting, or become confused, poorly responsive, or inconsolable (young children) If you have any other emergent concerns regarding your health  Additional Information: Abdominal (belly) pain can be caused by many things. Your caregiver performed an examination and possibly ordered blood/urine tests and imaging (CT scan, x-rays, ultrasound). Many cases can be observed and treated at home after initial evaluation in the emergency department. Even though you are being discharged home,  abdominal pain can be unpredictable. Therefore, you need a repeated exam if your pain does not resolve, returns, or worsens. Most patients with abdominal pain don't have to be admitted to the hospital or have surgery, but serious problems like appendicitis and gallbladder attacks can start out as nonspecific pain. Many abdominal conditions cannot be diagnosed in one visit, so follow-up evaluations are very important.  Your vital signs today were: BP (!) 176/101   Pulse 77   Temp 97.8 F (36.6 C) (Oral)   Resp (!) 21   Ht 5' 5 (1.651 m)   Wt 87.6 kg   SpO2 93%   BMI 32.15 kg/m  If your blood pressure (bp) was elevated above 135/85 this visit, please have this repeated by your doctor within one month. --------------

## 2024-05-20 NOTE — ED Triage Notes (Signed)
 C/o abdominal pain x 4 days with diarrhea and nausea. Hx of same.

## 2024-05-24 ENCOUNTER — Telehealth: Payer: Self-pay

## 2024-05-24 ENCOUNTER — Other Ambulatory Visit: Payer: Self-pay

## 2024-05-24 ENCOUNTER — Ambulatory Visit: Admitting: Internal Medicine

## 2024-05-24 ENCOUNTER — Encounter: Payer: Self-pay | Admitting: Internal Medicine

## 2024-05-24 VITALS — BP 118/78 | HR 97 | Temp 98.2°F | Wt 190.4 lb

## 2024-05-24 DIAGNOSIS — K529 Noninfective gastroenteritis and colitis, unspecified: Secondary | ICD-10-CM

## 2024-05-24 DIAGNOSIS — Z8619 Personal history of other infectious and parasitic diseases: Secondary | ICD-10-CM | POA: Diagnosis not present

## 2024-05-24 MED ORDER — LOMOTIL 2.5-0.025 MG PO TABS: 1.0000 | ORAL_TABLET | Freq: Two times a day (BID) | ORAL | 6 refills | Status: AC

## 2024-05-24 MED ORDER — DIPHENOXYLATE-ATROPINE 2.5-0.025 MG PO TABS: 1.0000 | ORAL_TABLET | Freq: Two times a day (BID) | ORAL | 6 refills | Status: DC

## 2024-05-24 NOTE — Progress Notes (Signed)
 Per Dr Luiz called GI to schedule appointment for abdominal pain. Patient is being followed by [facility] Dr. Timm. P [phone].

## 2024-05-24 NOTE — Progress Notes (Signed)
 RFV: follow up for chronic diarrhea, hx of cdifficile  [name], female   DOB: 08-19-1956, 68 y.o.   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 She was recently  Hospitalized from 6/21-6/25; completed 3 days IV abx for UTI with rocephin  and urine culture (-), no indication to continue IV abx. ABD pain and diarrhea have since resolved, GIPP and Cdiff toxin (-) With plans to follow up with PCP as outpatient  sheHad significant abdominal pain where she went to the ED this past Saturday she--> was ruled out for mesenteric ischemia.   She takes 6 tabs of immodium and 2 tabs of lomotil  to help manage diarrhea. She reports 3 loose stools per day, less than prior. She also  Has decreased appetite- only eating at most 2 meals per day. Scared to eat because she gets diarrhea. She reports that since her hospitalization she has early satiety and abdominal pain is different than what she has had before.  Talking to mercedes, power of attorney- also on the phone advocating for patient that she also noticed this is different than in the past    Outpatient Encounter Medications as of 05/24/2024  Medication Sig   acetaminophen  (TYLENOL ) 500 MG tablet Take 500 mg by mouth every 6 (six) hours as needed.   acyclovir  (ZOVIRAX ) 400 MG tablet TAKE ONE (1) TABLET BY MOUTH TWO (2) TIMES DAILY   albuterol  (PROVENTIL ) (2.5 MG/3ML) 0.083% nebulizer solution Take 3 mLs (2.5 mg total) by nebulization every 6 (six) hours as needed for wheezing or shortness of breath.   albuterol  (VENTOLIN  HFA) 108 (90 Base) MCG/ACT inhaler Inhale 2 puffs into the lungs every 6 (six) hours as needed for wheezing or shortness of breath.   amLODipine  (NORVASC ) 5 MG tablet Take 5 mg by mouth daily.    aspirin  EC 81 MG tablet Take 81 mg by mouth daily. Swallow whole.   budesonide  (PULMICORT ) 0.5 MG/2ML nebulizer solution Take 2 mLs (0.5 mg total) by nebulization 2 (two) times daily as needed (cough, wheezing or shortness of breath).   cetirizine  (ZYRTEC ) 10 MG tablet Take 10 mg by mouth at bedtime.   Cholecalciferol (VITAMIN D3) 50 MCG (2000 UT) TABS Take 2,000 Units by mouth in the morning.   cholestyramine  light (PREVALITE ) 4 g packet Take 4 g by mouth 2 (two) times daily.   cyclobenzaprine  (FLEXERIL ) 5 MG tablet Take 1 tablet (5 mg total) by mouth 2 (two) times daily as needed for up to 10 doses for muscle spasms.   dicyclomine  (BENTYL ) 10 MG capsule Take 1 capsule (10 mg total) by mouth 4 (four) times daily -  before meals and at bedtime. cramping   diphenhydrAMINE  (BENADRYL ) 25 MG tablet Take 25 mg by mouth.   diphenoxylate -atropine  (LOMOTIL ) 2.5-0.025 MG tablet Take 1 tablet by mouth 2 (two) times daily.   estradiol  (ESTRACE ) 0.1 MG/GM vaginal cream Place 0.5 g vaginally 2 (two) times a week. Place 0.5g nightly for two weeks then twice a week after   famotidine  (PEPCID ) 20 MG tablet Take 1 tablet (20 mg total) by mouth at bedtime.   ferrous sulfate (FEROSUL) 325 (65 FE) MG tablet Take 1 tablet (325 mg total) by mouth daily with breakfast.   fluconazole  (DIFLUCAN ) 150 MG tablet Take  by mouth.   fluticasone  (FLONASE ) 50 MCG/ACT nasal spray Place 2 sprays into both nostrils daily.   furosemide  (LASIX ) 20 MG tablet Take 20 mg by mouth daily as needed (fluid retention (feet swelling)).   hydrOXYzine  (ATARAX ) 10 MG tablet TAKE ONE (1) TABLET BY MOUTH 3 TIMES DAILY AS NEEDED FOR ITCHING   hyoscyamine  (LEVSIN  SL) 0.125 MG SL tablet Place 1 tablet (0.125 mg total) under the tongue every 4 (four) hours as needed.   lidocaine  (XYLOCAINE ) 5 % ointment Apply 1 Application topically as needed.   lidocaine -prilocaine  (EMLA ) cream Apply 1 Application topically as needed.   loperamide  (IMODIUM ) 2 MG  capsule Take 4 mg by mouth in the morning and at bedtime.   losartan  (COZAAR ) 50 MG tablet Take 50 mg by mouth in the morning and at bedtime.   Magnesium  Glycinate 100 MG CAPS Take 200 mg by mouth at bedtime.   meclizine  (ANTIVERT ) 12.5 MG tablet Take 12.5 mg by mouth 2 (two) times daily as needed for dizziness or nausea.   metoprolol  succinate (TOPROL -XL) 50 MG 24 hr tablet Take 1 tablet (50 mg total) by mouth 2 (two) times daily.   metroNIDAZOLE  (METROGEL ) 0.75 % gel Insert One applicatorful (5 g ) vaginally at bedtime for 5 days   montelukast  (SINGULAIR ) 10 MG tablet Take 1 tablet (10 mg total) by mouth at bedtime. Please start taking this on September 4.   Multiple Vitamins-Minerals (CENTRUM SILVER  WOMEN 50+ PO) Take 1 tablet by mouth daily at 6 (six) AM.   neomycin -polymyxin-hydrocortisone (CORTISPORIN) OTIC solution Place 4 drops into both ears 4 (four) times daily.   ondansetron  (ZOFRAN ) 8 MG tablet Take 1 tablet (8 mg total) by mouth every 8 (eight) hours as needed for nausea or vomiting.   oxyCODONE -acetaminophen  (PERCOCET) 5-325 MG tablet Take 2 tablets by mouth every 6 (six) hours as needed.   pantoprazole  (PROTONIX ) 40 MG tablet Take 1 tablet (40 mg total) by mouth daily.   phenazopyridine  (PYRIDIUM ) 200 MG tablet Take 1 tablet (200 mg total) by mouth 3 (three) times daily as needed for pain.   potassium chloride  SA (KLOR-CON  M) 20 MEQ tablet TAKE 1 TABLET BY MOUTH EVERY MONDAY, WEDNESDAY, AND FRIDAY.   prochlorperazine  (COMPAZINE ) 10 MG tablet Take 0.5 tablets (5 mg total) by mouth 2 (two) times daily as needed for nausea or vomiting.   Respiratory Therapy Supplies (PILLOW MASK/ADULT) MISC    scopolamine  (TRANSDERM-SCOP) 1 MG/3DAYS Place 1 patch (1.5 mg total) onto the skin every 3 (three) days.   sertraline  (ZOLOFT ) 50 MG tablet Take 1 tablet (50 mg total) by mouth daily.   spironolactone  (ALDACTONE ) 25 MG tablet Take 25 mg by mouth daily.   sucralfate  (CARAFATE ) 1 GM/10ML suspension  Take 10 mLs (1 g total) by mouth 4 (four) times daily -  with meals and at bedtime.   sulfamethoxazole -trimethoprim  (BACTRIM  DS) 800-160 MG tablet Take 1 tablet by mouth 2 (two) times daily.   Vibegron  (GEMTESA ) 75 MG TABS Take 1 tablet (75 mg total) by mouth daily.   Facility-Administered Encounter Medications as of 05/24/2024  Medication   acetaminophen  (TYLENOL ) tablet 650 mg   nitroGLYCERIN  (NITROSTAT ) SL tablet 0.4 mg   sodium chloride  flush (NS) 0.9 % injection 10 mL     Patient Active Problem List   Diagnosis Date Noted   Stress incontinence 01/31/2024   Nocturia 01/31/2024   Pelvic pain 01/31/2024   Incontinence of feces 01/31/2024   Vaginal atrophy 01/31/2024   Vaginal discharge 01/31/2024  Acute pain of left shoulder 09/03/2023   Chronic combined systolic and diastolic congestive heart failure (HCC) 03/28/2023   Intractable nausea and vomiting 03/27/2023   Asthma 12/20/2022   Diarrhea    Hypocalcemia 08/25/2022   Iron  deficiency anemia due to chronic blood loss 05/28/2022   Chronic diarrhea 05/27/2022   Soft tissue lesion 04/28/2022   Obesity (BMI 30-39.9) 04/28/2022   SBO (small bowel obstruction) (HCC) 04/27/2022   Dehydration 04/03/2022   Acute vaginitis 03/18/2022   Cervical cancer screening 03/18/2022   Dizziness 12/11/2021   Visual changes 11/26/2021   Ear fullness, bilateral 08/12/2021   Bilateral knee pain 08/08/2021   History of 2019 novel coronavirus disease (COVID-19) 12/06/2020   Vitamin deficiency 07/31/2020   Acute combined systolic and diastolic heart failure (HCC) 05/09/2020   Hypomagnesemia 03/04/2020   Thrombocytopenia (HCC) 03/04/2020   Dysphagia 03/04/2020   Constipation 03/04/2020   Sensation of pressure in bladder area 01/17/2020   Hyperglycemia 01/17/2020   Peripheral neuropathy 11/15/2019   Chronic migraine without aura, with intractable migraine, so stated, with status migrainosus 10/17/2019   ETD (Eustachian tube dysfunction),  bilateral 09/12/2019   Urinary frequency 07/17/2019   Headache 07/17/2019   Palpitation 05/15/2019   Clostridium difficile diarrhea 09/04/2018   Thyroid  nodule 08/26/2018   Multiple myeloma (HCC) 07/20/2018   Multiple myeloma not having achieved remission (HCC) 07/20/2018   Hematuria 07/11/2018   Seasonal allergies 06/23/2018   Right shoulder pain 06/07/2018   Neck pain 06/07/2018   Paresthesias 06/07/2018   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   Pain in thoracic spine 03/18/2018   Thoracic back pain 02/15/2018   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   Hypokalemia 08/20/2017   Anemia 08/20/2017   Elevated sed rate 08/15/2017   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   Epigastric abdominal pain 03/22/2017   Myalgia 12/31/2016   Abnormal SPEP 07/26/2016   Low back pain 07/14/2016   Insomnia 12/29/2015   Tension headache 10/21/2015   Muscle spasms of neck 10/18/2015   Dysuria 10/13/2015   AP (abdominal pain) 07/07/2015   Cardiomyopathy (HCC) 04/19/2015   Chest pain 04/19/2015   Obstructive sleep apnea 03/31/2015   Congestive heart failure (HCC) 02/24/2015   Arthritis    Hyperlipidemia    Hypertension    Depression    Stroke [facility])    GERD (gastroesophageal reflux disease)    Bowel obstruction (HCC) 11/09/2014     Health Maintenance Due  Topic Date Due   MAMMOGRAM  04/03/2023   DEXA SCAN  11/07/2023   Medicare Annual Wellness (AWV)  07/07/2024     Review of Systems 12 point ros is negative except what is mentioned above Physical Exam   BP 118/78 (BP Location: Left Arm, Patient Position: Sitting, Cuff Size: Normal)   Pulse 97   Temp 98.2 F (36.8 C)   Wt 190 lb 6.4 oz (86.4 kg)   SpO2 96%   BMI 31.68 kg/m    Physical Exam  Constitutional:  oriented to person, place, and time. appears well-developed and well-nourished. No distress.  HENT: [place]/AT, PERRLA, no scleral icterus Mouth/Throat: Oropharynx is clear and moist. No oropharyngeal  exudate.  Cardiovascular: Normal rate, regular rhythm and normal heart sounds. Exam reveals no gallop and no friction rub.  No murmur heard.  Pulmonary/Chest: Effort normal and breath sounds normal. No respiratory distress.  has no wheezes.  Neck = supple, no nuchal rigidity Abdominal: Soft. Bowel sounds are normal.  exhibits no distension. There is no tenderness.  Lymphadenopathy: no cervical adenopathy. No axillary adenopathy Neurological: alert and oriented to person, place, and time.  Skin: Skin is warm and dry. No rash noted. No erythema.  Psychiatric: a normal mood and affect.  behavior is normal.   Lab Results  Component Value Date   LABRPR NON REACTIVE 08/23/2020    CBC Lab Results  Component Value Date   WBC 4.1 05/20/2024   RBC 4.39 05/20/2024   HGB 13.0 05/20/2024   HCT 40.2 05/20/2024   PLT 159 05/20/2024   MCV 91.6 05/20/2024   MCH 29.6 05/20/2024   MCHC 32.3 05/20/2024   RDW 12.6 05/20/2024   LYMPHSABS 0.9 05/18/2024   MONOABS 0.2 05/18/2024   EOSABS 0.0 05/18/2024    BMET Lab Results  Component Value Date   NA 141 05/20/2024   K 4.7 05/20/2024   CL 105 05/20/2024   CO2 24 05/20/2024   GLUCOSE 124 (H) 05/20/2024   BUN 17 05/20/2024   CREATININE 0.99 05/20/2024   CALCIUM 10.1 05/20/2024   GFRNONAA >60 05/20/2024   GFRAA >60 08/10/2020      Assessment and Plan  Abdominal pain with diarrhea = cdiff recently ruled out. She had labs this week that do not suggest any leukocytosis. This still appears by history to probably be noninfectious cause. No need for treatment of cdiff nor need for FMT at this stage unless cdifficile recurs. I am concerned that the patient reports that abdominal discomfort is different than previously. Can continue with bentyl  and anti-motility drugs. Will reach out to her gi office to see if she can get worked in. App in August 1st  I have personally spent 30 minutes involved in face-to-face and non-face-to-face activities for this  patient on the day of the visit. Professional time spent includes the following activities: Preparing to see the patient (review of tests), Obtaining and/or reviewing separately obtained history (admission/discharge record), Performing a medically appropriate examination and/or evaluation , Ordering medications/tests/procedures, referring and communicating with other health care professionals, Documenting clinical information in the EMR, Independently interpreting results (not separately reported), Communicating results to the patient/family/caregiver, Counseling and educating the patient/family/caregiver and Care coordination (not separately reported).

## 2024-05-24 NOTE — Telephone Encounter (Signed)
 Clinical Social Work attempted to contact patient by phone to follow up regarding adjusting to her illness.  Left voicemail with contact information and request for return call.

## 2024-05-24 NOTE — Telephone Encounter (Signed)
 Received call from [facility] stating next appt is not until 8/1 [facility].  GI will call patient to schedule appt. Left vm with patient regarding update.  If pt not able to attend appt on 8/1 next available will be in October. Lorenda CHRISTELLA Code, RMA

## 2024-05-25 ENCOUNTER — Other Ambulatory Visit: Payer: Self-pay

## 2024-05-26 ENCOUNTER — Ambulatory Visit: Admitting: Internal Medicine

## 2024-05-26 ENCOUNTER — Telehealth: Payer: Self-pay | Admitting: Gastroenterology

## 2024-05-26 DIAGNOSIS — Z1231 Encounter for screening mammogram for malignant neoplasm of breast: Secondary | ICD-10-CM | POA: Diagnosis not present

## 2024-05-26 LAB — HM MAMMOGRAPHY

## 2024-05-26 NOTE — Telephone Encounter (Signed)
 Good Morning Dr Charlanne  Patient requesting to transfer care back to our practice to continue GI care.   Last seen with [facility] 12/20/23. Records available for review in epic. Please review and advise on scheduling.   Thank you

## 2024-05-29 NOTE — Telephone Encounter (Signed)
OK for APP clinic RG

## 2024-05-30 DIAGNOSIS — K2900 Acute gastritis without bleeding: Secondary | ICD-10-CM | POA: Diagnosis not present

## 2024-05-30 DIAGNOSIS — N3000 Acute cystitis without hematuria: Secondary | ICD-10-CM | POA: Diagnosis not present

## 2024-05-30 DIAGNOSIS — R300 Dysuria: Secondary | ICD-10-CM | POA: Diagnosis not present

## 2024-06-01 ENCOUNTER — Telehealth: Payer: Self-pay | Admitting: Physical Therapy

## 2024-06-01 ENCOUNTER — Ambulatory Visit: Attending: Obstetrics | Admitting: Physical Therapy

## 2024-06-01 DIAGNOSIS — M25561 Pain in right knee: Secondary | ICD-10-CM | POA: Diagnosis not present

## 2024-06-01 DIAGNOSIS — G8929 Other chronic pain: Secondary | ICD-10-CM | POA: Diagnosis not present

## 2024-06-01 DIAGNOSIS — R279 Unspecified lack of coordination: Secondary | ICD-10-CM | POA: Insufficient documentation

## 2024-06-01 DIAGNOSIS — R293 Abnormal posture: Secondary | ICD-10-CM | POA: Insufficient documentation

## 2024-06-01 DIAGNOSIS — M6281 Muscle weakness (generalized): Secondary | ICD-10-CM | POA: Insufficient documentation

## 2024-06-01 DIAGNOSIS — M1712 Unilateral primary osteoarthritis, left knee: Secondary | ICD-10-CM | POA: Diagnosis not present

## 2024-06-01 DIAGNOSIS — M62838 Other muscle spasm: Secondary | ICD-10-CM | POA: Insufficient documentation

## 2024-06-01 DIAGNOSIS — M25562 Pain in left knee: Secondary | ICD-10-CM | POA: Diagnosis not present

## 2024-06-01 NOTE — Telephone Encounter (Signed)
 PT called pt about this morning's appointment at 0845. Pt did not answer, voicemail left.    Darryle Navy, PT, DPT 07/24/259:30 AM  [facility] [address] [place], [place] [address] Phone # [phone] Fax [phone]

## 2024-06-02 ENCOUNTER — Encounter: Payer: Self-pay | Admitting: Hematology & Oncology

## 2024-06-02 ENCOUNTER — Inpatient Hospital Stay: Admitting: Hematology & Oncology

## 2024-06-02 ENCOUNTER — Inpatient Hospital Stay

## 2024-06-02 VITALS — BP 146/90 | HR 88 | Temp 97.4°F | Resp 17 | Wt 192.0 lb

## 2024-06-02 VITALS — BP 138/67 | HR 77 | Resp 17

## 2024-06-02 DIAGNOSIS — C9000 Multiple myeloma not having achieved remission: Secondary | ICD-10-CM

## 2024-06-02 LAB — CMP (CANCER CENTER ONLY)
ALT: 24 U/L (ref 0–44)
AST: 23 U/L (ref 15–41)
Albumin: 4.5 g/dL (ref 3.5–5.0)
Alkaline Phosphatase: 50 U/L (ref 38–126)
Anion gap: 13 (ref 5–15)
BUN: 26 mg/dL — ABNORMAL HIGH (ref 8–23)
CO2: 21 mmol/L — ABNORMAL LOW (ref 22–32)
Calcium: 9.9 mg/dL (ref 8.9–10.3)
Chloride: 106 mmol/L (ref 98–111)
Creatinine: 0.73 mg/dL (ref 0.44–1.00)
GFR, Estimated: >60 mL/min (ref >60)
Glucose, Bld: 180 mg/dL — ABNORMAL HIGH (ref 70–99)
Potassium: 4.1 mmol/L (ref 3.5–5.1)
Sodium: 140 mmol/L (ref 135–145)
Total Bilirubin: 0.4 mg/dL (ref 0.0–1.2)
Total Protein: 7 g/dL (ref 6.5–8.1)

## 2024-06-02 LAB — CBC WITH DIFFERENTIAL (CANCER CENTER ONLY)
Abs Immature Granulocytes: 0.05 K/uL (ref 0.00–0.07)
Basophils Absolute: 0 K/uL (ref 0.0–0.1)
Basophils Relative: 0 %
Eosinophils Absolute: 0 K/uL (ref 0.0–0.5)
Eosinophils Relative: 0 %
HCT: 36.1 % (ref 36.0–46.0)
Hemoglobin: 12 g/dL (ref 12.0–15.0)
Immature Granulocytes: 1 %
Lymphocytes Relative: 4 %
Lymphs Abs: 0.4 K/uL — ABNORMAL LOW (ref 0.7–4.0)
MCH: 30.3 pg (ref 26.0–34.0)
MCHC: 33.2 g/dL (ref 30.0–36.0)
MCV: 91.2 fL (ref 80.0–100.0)
Monocytes Absolute: 0.1 K/uL (ref 0.1–1.0)
Monocytes Relative: 1 %
Neutro Abs: 8.6 K/uL — ABNORMAL HIGH (ref 1.7–7.7)
Neutrophils Relative %: 94 %
Platelet Count: 141 K/uL — ABNORMAL LOW (ref 150–400)
RBC: 3.96 MIL/uL (ref 3.87–5.11)
RDW: 12.5 % (ref 11.5–15.5)
WBC Count: 9.2 K/uL (ref 4.0–10.5)
nRBC: 0 % (ref 0.0–0.2)

## 2024-06-02 LAB — LACTATE DEHYDROGENASE: LDH: 247 U/L — ABNORMAL HIGH (ref 98–192)

## 2024-06-02 MED ORDER — HEPARIN SOD (PORK) LOCK FLUSH 100 UNIT/ML IV SOLN
500.0000 [IU] | Freq: Once | INTRAVENOUS | Status: AC
  Administered 2024-06-02: 500 [IU] via INTRAVENOUS

## 2024-06-02 MED ORDER — SODIUM CHLORIDE 0.9 % IV SOLN: Freq: Once | INTRAVENOUS | Status: AC

## 2024-06-02 MED ORDER — SODIUM CHLORIDE 0.9% FLUSH
10.0000 mL | Freq: Once | INTRAVENOUS | Status: AC
  Administered 2024-06-02: 10 mL via INTRAVENOUS

## 2024-06-02 NOTE — Patient Instructions (Signed)
 Dehydration, Adult Dehydration is a condition in which there is not enough water or other fluids in the body. This happens when a person loses more fluids than they take in. Important organs cannot work right without the right amount of fluids. Any loss of fluids from the body can cause dehydration. Dehydration can be mild, worse, or very bad. It should be treated right away to keep it from getting very bad. What are the causes? Conditions that cause loss of water in the body. They include: Watery poop (diarrhea). Vomiting. Sweating a lot. Fever. Infection. Peeing (urinating) a lot. Not drinking enough fluids. Certain medicines, such as medicines that take extra fluid out of the body (diuretics). Lack of safe drinking water. Not being able to get enough water and food. What increases the risk? Having a long-term (chronic) illness that has not been treated the right way, such as: Diabetes. Heart disease. Kidney disease. Being 25 years of age or older. Having a disability. Living in a place that is high above the ground or sea (high in altitude). The thinner, drier air causes more fluid loss. Doing exercises that put stress on your body for a long time. Being active when in hot places. What are the signs or symptoms? Symptoms of dehydration depend on how bad it is. Mild or worse dehydration Thirst. Dry lips or dry mouth. Feeling dizzy or light-headed. Muscle cramps. Passing little pee or dark pee. Pee may be the color of tea. Headache. Very bad dehydration Changes in skin. Skin may: Be cold to the touch (clammy). Be blotchy or pale. Not go back to normal right after you pinch it and let it go. Little or no tears, pee, or sweat. Fast breathing. Low blood pressure. Weak pulse. Pulse that is more than 100 beats a minute when you are sitting still. Other changes, such as: Feeling very thirsty. Eyes that look hollow (sunken). Cold hands and feet. Being confused. Being very  tired (lethargic) or having trouble waking from sleep. Losing weight. Loss of consciousness. How is this treated? Treatment for this condition depends on how bad your dehydration is. Treatment should start right away. Do not wait until your condition gets very bad. Very bad dehydration is an emergency. You will need to go to a hospital. Mild or worse dehydration can be treated at home. You may be asked to: Drink more fluids. Drink an oral rehydration solution (ORS). This drink gives you the right amount of fluids, salts, and minerals (electrolytes). Very bad dehydration can be treated: With fluids through an IV tube. By correcting low levels of electrolytes in the body. By treating the problem that caused your dehydration. Follow these instructions at home: Oral rehydration solution If told by your doctor, drink an ORS: Make an ORS. Use instructions on the package. Start by drinking small amounts, about  cup (120 mL) every 5-10 minutes. Slowly drink more until you have had the amount that your doctor said to have.  Eating and drinking  Drink enough clear fluid to keep your pee pale yellow. If you were told to drink an ORS, finish the ORS first. Then, start slowly drinking other clear fluids. Drink fluids such as: Water. Do not drink only water. Doing that can make the salt (sodium) level in your body get too low. Water from ice chips you suck on. Fruit juice that you have added water to (diluted). Low-calorie sports drinks. Eat foods that have the right amounts of salts and minerals, such as bananas, oranges, potatoes,  tomatoes, or spinach. Do not drink alcohol. Avoid drinks that have caffeine or sugar. These include:: High-calorie sports drinks. Fruit juice that you did not add water to. Soda. Coffee or energy drinks. Avoid foods that are greasy or have a lot of fat or sugar. General instructions Take over-the-counter and prescription medicines only as told by your doctor. Do  not take sodium tablets. Doing that can make the salt level in your body get too high. Return to your normal activities as told by your doctor. Ask your doctor what activities are safe for you. Keep all follow-up visits. Your doctor may check and change your treatment. Contact a doctor if: You have pain in your belly (abdomen) and the pain: Gets worse. Stays in one place. You have a rash. You have a stiff neck. You get angry or annoyed more easily than normal. You are more tired or have a harder time waking than normal. You feel weak or dizzy. You feel very thirsty. Get help right away if: You have any symptoms of very bad dehydration. You vomit every time you eat or drink. Your vomiting gets worse, does not go away, or you vomit blood or green stuff. You are getting treatment, but symptoms are getting worse. You have a fever. You have a very bad headache. You have: Diarrhea that gets worse or does not go away. Blood in your poop (stool). This may cause poop to look black and tarry. No pee in 6-8 hours. Only a small amount of pee in 6-8 hours, and the pee is very dark. You have trouble breathing. These symptoms may be an emergency. Get help right away. Call 911. Do not wait to see if the symptoms will go away. Do not drive yourself to the hospital. This information is not intended to replace advice given to you by your health care provider. Make sure you discuss any questions you have with your health care provider. Document Revised: 05/25/2022 Document Reviewed: 05/25/2022 Elsevier Patient Education  2024 [employer].

## 2024-06-02 NOTE — Patient Instructions (Signed)

## 2024-06-02 NOTE — Progress Notes (Signed)
 Hematology and Oncology Follow Up Visit  Tami [name] [id number] 1955-11-30 68 y.o. 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 Hodgkin is here today for follow-up.  She was in the Urgent Care this past week.  She had a UTI.  She was placed onto ciprofloxacin .  She feels better.  Thankfully, it is not causing her problems with diarrhea.  Overall, she is feeling okay.  Is been 6 months since she had any kind of treatment.  When we last saw her back in June, her monoclonal spike was 0.3 g/dL.  The IgG level was 875 mg/dL.  The Kappa light chain was 0.7 mg/dL.  All this is holding steady.  She has had no problems with bleeding.  There is been no cough..  She does have a pacemaker in.  She has had no issues with arrhythmia.  She has had no leg swelling.  There is been no problems with pain.  Overall, I would have to say that her performance status is probably ECOG 1.     Medications:  Allergies as of 06/02/2024       Reactions   Benazepril  Anaphylaxis, Swelling, Hives   angioedema Throat and lip swelling   Ondansetron  Hcl Hives   Redness and hives post IV admin on 07/05/17   Codeine Nausea And Vomiting   Morphine Hives   Redness and hives noted post IV admin on 07/05/17        Medication List        Accurate as of June 02, 2024  8:41 AM. If you have any questions, ask your nurse or doctor.          acetaminophen  500 MG tablet Commonly known as: TYLENOL  Take 500 mg by mouth every 6 (six) hours as  needed.   acyclovir  400 MG tablet Commonly known as: ZOVIRAX  TAKE ONE (1) TABLET BY MOUTH TWO (2) TIMES DAILY   albuterol  (2.5 MG/3ML) 0.083% nebulizer solution Commonly known as: PROVENTIL  Take 3 mLs (2.5 mg total) by nebulization every 6 (six) hours as needed for wheezing or shortness of breath.   albuterol  108 (90 Base) MCG/ACT inhaler Commonly known as: VENTOLIN  HFA Inhale 2 puffs into the lungs every 6 (six) hours as needed for wheezing or shortness of breath.   amLODipine  5 MG tablet Commonly known as: NORVASC  Take 5 mg by mouth daily.   aspirin  EC 81 MG tablet Take 81 mg by mouth daily. Swallow whole.   budesonide  0.5 MG/2ML nebulizer solution Commonly known as: Pulmicort  Take 2 mLs (0.5 mg total) by nebulization 2 (two) times daily as needed (cough, wheezing or shortness of breath).   CENTRUM SILVER  WOMEN 50+ PO Take 1 tablet by mouth daily at 6 (six) AM.   cetirizine  10 MG tablet Commonly known as: ZYRTEC  Take 10 mg by mouth at bedtime.   cholestyramine  light 4 g packet Commonly known as: PREVALITE  Take 4 g by mouth 2 (two) times daily.   ciprofloxacin  500 MG  tablet Commonly known as: CIPRO  Take 500 mg by mouth 2 (two) times daily.   cyclobenzaprine  5 MG tablet Commonly known as: FLEXERIL  Take 1 tablet (5 mg total) by mouth 2 (two) times daily as needed for up to 10 doses for muscle spasms.   dicyclomine  10 MG capsule Commonly known as: BENTYL  Take 1 capsule (10 mg total) by mouth 4 (four) times daily -  before meals and at bedtime. cramping   diphenhydrAMINE  25 MG tablet Commonly known as: BENADRYL  Take 25 mg by mouth.   estradiol  0.1 MG/GM vaginal cream Commonly known as: ESTRACE  Place 0.5 g vaginally 2 (two) times a week. Place 0.5g nightly for two weeks then twice a week after   famotidine  20 MG tablet Commonly known as: PEPCID  Take 1 tablet (20 mg total) by mouth at bedtime. What changed: how much to take   ferrous sulfate 325 (65 FE) MG  tablet Commonly known as: FeroSul Take 1 tablet (325 mg total) by mouth daily with breakfast.   fluconazole  150 MG tablet Commonly known as: DIFLUCAN  Take by mouth.   fluticasone  50 MCG/ACT nasal spray Commonly known as: FLONASE  Place 2 sprays into both nostrils daily.   furosemide  20 MG tablet Commonly known as: LASIX  Take 20 mg by mouth daily as needed (fluid retention (feet swelling)).   Gemtesa  75 MG Tabs Generic drug: Vibegron  Take 1 tablet (75 mg total) by mouth daily.   hydrOXYzine  10 MG tablet Commonly known as: ATARAX  TAKE ONE (1) TABLET BY MOUTH 3 TIMES DAILY AS NEEDED FOR ITCHING   hyoscyamine  0.125 MG SL tablet Commonly known as: LEVSIN  SL Place 1 tablet (0.125 mg total) under the tongue every 4 (four) hours as needed.   lidocaine  5 % ointment Commonly known as: XYLOCAINE  Apply 1 Application topically as needed.   lidocaine -prilocaine  cream Commonly known as: EMLA  Apply 1 Application topically as needed.   Lomotil  2.5-0.025 MG tablet Generic drug: diphenoxylate -atropine  Take 1 tablet by mouth 2 (two) times daily.   loperamide  2 MG capsule Commonly known as: IMODIUM  Take 4 mg by mouth in the morning and at bedtime.   losartan  50 MG tablet Commonly known as: COZAAR  Take 50 mg by mouth in the morning and at bedtime.   Magnesium  Glycinate 100 MG Caps Take 200 mg by mouth at bedtime.   meclizine  12.5 MG tablet Commonly known as: ANTIVERT  Take 12.5 mg by mouth 2 (two) times daily as needed for dizziness or nausea.   metoprolol  succinate 50 MG 24 hr tablet Commonly known as: TOPROL -XL Take 1 tablet (50 mg total) by mouth 2 (two) times daily.   metroNIDAZOLE  0.75 % gel Commonly known as: METROGEL  Insert One applicatorful (5 g ) vaginally at bedtime for 5 days   montelukast  10 MG tablet Commonly known as: Singulair  Take 1 tablet (10 mg total) by mouth at bedtime. Please start taking this on September 4.   neomycin -polymyxin-hydrocortisone OTIC  solution Commonly known as: CORTISPORIN Place 4 drops into both ears 4 (four) times daily.   ondansetron  8 MG tablet Commonly known as: Zofran  Take 1 tablet (8 mg total) by mouth every 8 (eight) hours as needed for nausea or vomiting.   oxyCODONE -acetaminophen  5-325 MG tablet Commonly known as: Percocet Take 2 tablets by mouth every 6 (six) hours as needed.   pantoprazole  40 MG tablet Commonly known as: PROTONIX  Take 1 tablet (40 mg total) by mouth daily.   phenazopyridine  200 MG tablet Commonly known as: Pyridium  Take 1 tablet (200 mg  total) by mouth 3 (three) times daily as needed for pain.   Pillow Mask/Adult Misc   potassium chloride  SA 20 MEQ tablet Commonly known as: KLOR-CON  M TAKE 1 TABLET BY MOUTH EVERY MONDAY, WEDNESDAY, AND FRIDAY.   prochlorperazine  10 MG tablet Commonly known as: COMPAZINE  Take 0.5 tablets (5 mg total) by mouth 2 (two) times daily as needed for nausea or vomiting.   scopolamine  1 MG/3DAYS Commonly known as: TRANSDERM-SCOP Place 1 patch (1.5 mg total) onto the skin every 3 (three) days.   sertraline  50 MG tablet Commonly known as: ZOLOFT  Take 1 tablet (50 mg total) by mouth daily.   spironolactone  25 MG tablet Commonly known as: ALDACTONE  Take 25 mg by mouth daily.   sucralfate  1 GM/10ML suspension Commonly known as: Carafate  Take 10 mLs (1 g total) by mouth 4 (four) times daily -  with meals and at bedtime.   sulfamethoxazole -trimethoprim  800-160 MG tablet Commonly known as: BACTRIM  DS Take 1 tablet by mouth 2 (two) times daily.   Vitamin D3 50 MCG (2000 UT) Tabs Take 2,000 Units by mouth in the morning.        Allergies:  Allergies  Allergen Reactions   Benazepril  Anaphylaxis, Swelling and Hives    angioedema Throat and lip swelling   Ondansetron  Hcl Hives    Redness and hives post IV admin on 07/05/17   Codeine Nausea And Vomiting   Morphine Hives    Redness and hives noted post IV admin on 07/05/17    Past Medical  History, Surgical history, Social history, and Family History were reviewed and updated.  Review of Systems: Review of Systems  Constitutional: Negative.   HENT: Negative.    Eyes: Negative.   Respiratory: Negative.    Cardiovascular: Negative.   Gastrointestinal:  Positive for abdominal pain, diarrhea and nausea.  Musculoskeletal:  Positive for joint pain and myalgias.  Skin: Negative.   Neurological: Negative.   Endo/Heme/Allergies: Negative.   Psychiatric/Behavioral: Negative.       Physical Exam:  weight is 192 lb (87.1 kg). Her oral temperature is 97.4 F (36.3 C) (abnormal). Her blood pressure is 146/80 (abnormal) and her pulse is 88. Her respiration is 17 and oxygen saturation is 100%.   Wt Readings from Last 3 Encounters:  06/02/24 192 lb (87.1 kg)  05/24/24 190 lb 6.4 oz (86.4 kg)  05/20/24 193 lb 3.2 oz (87.6 kg)    Physical Exam Vitals reviewed.  HENT:     Head: Normocephalic and atraumatic.  Eyes:     Pupils: Pupils are equal, round, and reactive to light.  Cardiovascular:     Rate and Rhythm: Normal rate and regular rhythm.     Heart sounds: Normal heart sounds.  Pulmonary:     Effort: Pulmonary effort is normal.     Breath sounds: Normal breath sounds.  Abdominal:     General: Bowel sounds are normal.     Palpations: Abdomen is soft.  Musculoskeletal:        General: No tenderness or deformity. Normal range of motion.     Cervical back: Normal range of motion.  Lymphadenopathy:     Cervical: No cervical adenopathy.  Skin:    General: Skin is warm and dry.     Findings: No erythema or rash.  Neurological:     Mental Status: She is alert and oriented to person, place, and time.  Psychiatric:        Behavior: Behavior normal.  Thought Content: Thought content normal.        Judgment: Judgment normal.      Lab Results  Component Value Date   WBC 4.1 05/20/2024   HGB 13.0 05/20/2024   HCT 40.2 05/20/2024   MCV 91.6 05/20/2024   PLT 159  05/20/2024   Lab Results  Component Value Date   FERRITIN 166 11/26/2023   IRON  76 11/26/2023   TIBC 385 11/26/2023   UIBC 309 11/26/2023   IRONPCTSAT 20 11/26/2023   Lab Results  Component Value Date   RETICCTPCT 1.2 11/27/2022   RBC 4.39 05/20/2024   RETICCTABS 32,640 07/16/2016   Lab Results  Component Value Date   KPAFRELGTCHN 6.8 04/21/2024   LAMBDASER 2.3 (L) 04/21/2024   KAPLAMBRATIO 2.96 (H) 04/21/2024   Lab Results  Component Value Date   IGGSERUM 859 04/21/2024   IGGSERUM 895 04/21/2024   IGA 38 (L) 04/21/2024   IGA 43 (L) 04/21/2024   IGMSERUM 20 (L) 04/21/2024   IGMSERUM 24 (L) 04/21/2024   Lab Results  Component Value Date   TOTALPROTELP 6.2 04/21/2024   ALBUMINELP 3.3 04/21/2024   A1GS 0.2 04/21/2024   A2GS 0.9 04/21/2024   BETS 1.1 04/21/2024   BETA2SER 0.5 07/16/2016   GAMS 0.8 04/21/2024   MSPIKE 0.3 (H) 04/21/2024   SPEI Comment 03/22/2024     Chemistry      Component Value Date/Time   NA 141 05/20/2024 1114   NA 143 11/08/2017 1127   NA 140 12/31/2016 1000   K 4.7 05/20/2024 1114   K 4.0 11/08/2017 1127   K 3.6 12/31/2016 1000   CL 105 05/20/2024 1114   CL 107 11/08/2017 1127   CO2 24 05/20/2024 1114   CO2 25 11/08/2017 1127   CO2 24 12/31/2016 1000   BUN 17 05/20/2024 1114   BUN 19 11/08/2017 1127   BUN 22.3 12/31/2016 1000   CREATININE 0.99 05/20/2024 1114   CREATININE 0.75 04/21/2024 0806   CREATININE 0.63 03/15/2024 0947   CREATININE 0.8 12/31/2016 1000      Component Value Date/Time   CALCIUM 10.1 05/20/2024 1114   CALCIUM 9.9 11/08/2017 1127   CALCIUM 9.5 12/31/2016 1000   ALKPHOS 59 05/20/2024 1114   ALKPHOS 44 11/08/2017 1127   ALKPHOS 51 12/31/2016 1000   AST 51 (H) 05/20/2024 1114   AST 17 04/21/2024 0806   AST 28 12/31/2016 1000   ALT 32 05/20/2024 1114   ALT 15 04/21/2024 0806   ALT 36 11/08/2017 1127   ALT 16 12/31/2016 1000   BILITOT 0.6 05/20/2024 1114   BILITOT 0.5 04/21/2024 0806   BILITOT 0.46  12/31/2016 1000       Impression and Plan: Ms. Brenton is a very pleasant 68 yo African American female with a previous IgG kappa MGUS with progression to myeloma. Again, she has responded very nicely to treatment.   She has had her 68th birthday.  She really had a good time.  She went to a steak house.  She had cupcakes.  I am so happy for her.  Again we will still hold on treatment.  Everything is still holding nice and steady.  We will give her some IV fluid today.  We will plan to get her back in another month or so.  If she has any problems between now and then, we will get her back sooner.   Maude JONELLE Crease, MD 7/25/20258:41 AM

## 2024-06-03 LAB — IGG, IGA, IGM
IgA: 37 mg/dL — ABNORMAL LOW (ref 87–352)
IgG (Immunoglobin G), Serum: 845 mg/dL (ref 586–1602)
IgM (Immunoglobulin M), Srm: 22 mg/dL — ABNORMAL LOW (ref 26–217)

## 2024-06-04 ENCOUNTER — Other Ambulatory Visit: Payer: Self-pay

## 2024-06-04 ENCOUNTER — Emergency Department (HOSPITAL_BASED_OUTPATIENT_CLINIC_OR_DEPARTMENT_OTHER)

## 2024-06-04 ENCOUNTER — Encounter (HOSPITAL_BASED_OUTPATIENT_CLINIC_OR_DEPARTMENT_OTHER): Payer: Self-pay

## 2024-06-04 ENCOUNTER — Emergency Department (HOSPITAL_BASED_OUTPATIENT_CLINIC_OR_DEPARTMENT_OTHER)
Admission: EM | Admit: 2024-06-04 | Discharge: 2024-06-04 | Disposition: A | Discharge status: Home / Self Care | Attending: Emergency Medicine | Admitting: Emergency Medicine

## 2024-06-04 DIAGNOSIS — Z7982 Long term (current) use of aspirin: Secondary | ICD-10-CM | POA: Insufficient documentation

## 2024-06-04 DIAGNOSIS — Z7951 Long term (current) use of inhaled steroids: Secondary | ICD-10-CM | POA: Diagnosis not present

## 2024-06-04 DIAGNOSIS — R0789 Other chest pain: Secondary | ICD-10-CM | POA: Diagnosis not present

## 2024-06-04 DIAGNOSIS — J449 Chronic obstructive pulmonary disease, unspecified: Secondary | ICD-10-CM | POA: Insufficient documentation

## 2024-06-04 DIAGNOSIS — I110 Hypertensive heart disease with heart failure: Secondary | ICD-10-CM | POA: Insufficient documentation

## 2024-06-04 DIAGNOSIS — R079 Chest pain, unspecified: Secondary | ICD-10-CM | POA: Diagnosis not present

## 2024-06-04 DIAGNOSIS — I509 Heart failure, unspecified: Secondary | ICD-10-CM | POA: Diagnosis not present

## 2024-06-04 DIAGNOSIS — Z95810 Presence of automatic (implantable) cardiac defibrillator: Secondary | ICD-10-CM | POA: Diagnosis not present

## 2024-06-04 DIAGNOSIS — Z452 Encounter for adjustment and management of vascular access device: Secondary | ICD-10-CM | POA: Diagnosis not present

## 2024-06-04 DIAGNOSIS — Z79899 Other long term (current) drug therapy: Secondary | ICD-10-CM | POA: Diagnosis not present

## 2024-06-04 DIAGNOSIS — Z8579 Personal history of other malignant neoplasms of lymphoid, hematopoietic and related tissues: Secondary | ICD-10-CM | POA: Diagnosis not present

## 2024-06-04 LAB — CBC
HCT: 41.3 % (ref 36.0–46.0)
Hemoglobin: 13.4 g/dL (ref 12.0–15.0)
MCH: 29.9 pg (ref 26.0–34.0)
MCHC: 32.4 g/dL (ref 30.0–36.0)
MCV: 92.2 fL (ref 80.0–100.0)
Platelets: 163 K/uL (ref 150–400)
RBC: 4.48 MIL/uL (ref 3.87–5.11)
RDW: 12.8 % (ref 11.5–15.5)
WBC: 6.1 K/uL (ref 4.0–10.5)
nRBC: 0 % (ref 0.0–0.2)

## 2024-06-04 LAB — BASIC METABOLIC PANEL WITH GFR
Anion gap: 12 (ref 5–15)
BUN: 20 mg/dL (ref 8–23)
CO2: 25 mmol/L (ref 22–32)
Calcium: 9.8 mg/dL (ref 8.9–10.3)
Chloride: 105 mmol/L (ref 98–111)
Creatinine, Ser: 0.69 mg/dL (ref 0.44–1.00)
GFR, Estimated: >60 mL/min (ref >60)
Glucose, Bld: 97 mg/dL (ref 70–99)
Potassium: 4 mmol/L (ref 3.5–5.1)
Sodium: 142 mmol/L (ref 135–145)

## 2024-06-04 LAB — TROPONIN T, HIGH SENSITIVITY
Troponin T High Sensitivity: 17 ng/L (ref <19)
Troponin T High Sensitivity: <15 ng/L (ref <19)

## 2024-06-04 MED ORDER — SODIUM CHLORIDE 0.9 % IV BOLUS
1000.0000 mL | Freq: Once | INTRAVENOUS | Status: AC
  Administered 2024-06-04: 1000 mL via INTRAVENOUS

## 2024-06-04 NOTE — ED Notes (Signed)
 Attempted 1x IV stick, pt refused to let me access her port saying she only lets upstairs access the port. Told me to let someone else stick her. I obliged. Pt is needlessly difficult regarding IV sticks, and this is not the first time she gets angry with staff for IV sticks.   Pt has been encouraged to let us  use her port because we are certified and trained, and she continues to refuse.

## 2024-06-04 NOTE — ED Notes (Signed)
 Pt has returned to their room

## 2024-06-04 NOTE — ED Provider Notes (Signed)
 [facility] Provider Note   CSN: [id number] Arrival date & time: 06/04/24  9096     Patient presents with: Chest Pain   Tami [name] is a 68 y.o. female who presents the emergency department with chief complaint of back and chest pain.  She has past medical history of multiple myeloma, hypogammaglobulinemia, chronic upper and lower back pain, CHF, COPD, previous stroke, hypertension, hyperlipidemia, heart disease, GERD and hypertrophic breast tissue.  Patient reports that yesterday she was doing laundry and had just carried the basket to her folding area.  She was folding when she had sudden onset of pain in the middle of her upper back which she described as like a spasm.  It seemed to penetrate through to the front of her chest.  She states that it made her feel short of breath..  It was not worse with exertion.  She went to her room and put her CPAP on and stated for about 3 hours with total resolution of all her symptoms.  She denies any nausea vomiting diaphoresis exertional dyspnea exertional chest pain.  She is also complaining of her chronic diarrhea and states I just feel weak from all this diarrhea.  This is a chronic issue which has been worked up extensively including with infectious disease in the past and has had recent negative workup for C. difficile. .    Chest Pain      Prior to Admission medications   Medication Sig Start Date End Date Taking? Authorizing Provider  acetaminophen  (TYLENOL ) 500 MG tablet Take 500 mg by mouth every 6 (six) hours as needed.    [provider]  acyclovir  (ZOVIRAX ) 400 MG tablet TAKE ONE (1) TABLET BY MOUTH TWO (2) TIMES DAILY 02/08/24   Timmy Maude SAUNDERS, MD  albuterol  (PROVENTIL ) (2.5 MG/3ML) 0.083% nebulizer solution Take 3 mLs (2.5 mg total) by nebulization every 6 (six) hours as needed for wheezing or shortness of breath. 12/14/23   Kara Dorn NOVAK, MD  albuterol  (VENTOLIN  HFA)  [phone] Base) MCG/ACT inhaler Inhale 2 puffs into the lungs every 6 (six) hours as needed for wheezing or shortness of breath. 12/14/23   Kara Dorn NOVAK, MD  amLODipine  (NORVASC ) 5 MG tablet Take 5 mg by mouth daily. Patient not taking: Reported on 06/02/2024 09/14/23   [provider]  aspirin  EC 81 MG tablet Take 81 mg by mouth daily. Swallow whole.    [provider]  budesonide  (PULMICORT ) 0.5 MG/2ML nebulizer solution Take 2 mLs (0.5 mg total) by nebulization 2 (two) times daily as needed (cough, wheezing or shortness of breath). 12/14/23   Kara Dorn NOVAK, MD  cetirizine  (ZYRTEC ) 10 MG tablet Take 10 mg by mouth at bedtime.    [provider]  Cholecalciferol (VITAMIN D3) 50 MCG (2000 UT) TABS Take 2,000 Units by mouth in the morning.    [provider]  cholestyramine  light (PREVALITE ) 4 g packet Take 4 g by mouth 2 (two) times daily.    [provider]  ciprofloxacin  (CIPRO ) 500 MG tablet Take 500 mg by mouth 2 (two) times daily. 05/30/24   [provider]  cyclobenzaprine  (FLEXERIL ) 5 MG tablet Take 1 tablet (5 mg total) by mouth 2 (two) times daily as needed for up to 10 doses for muscle spasms. 01/03/24   Curatolo, Adam, DO  dicyclomine  (BENTYL ) 10 MG capsule Take 1 capsule (10 mg total) by mouth 4 (four) times daily -  before meals  and at bedtime. cramping 12/02/23 06/02/24  Domenica Harlene LABOR, MD  diphenhydrAMINE  (BENADRYL ) 25 MG tablet Take 25 mg by mouth. 01/28/22   [provider]  estradiol  (ESTRACE ) 0.1 MG/GM vaginal cream Place 0.5 g vaginally 2 (two) times a week. Place 0.5g nightly for two weeks then twice a week after 02/03/24   Guadlupe Dull T, MD  famotidine  (PEPCID ) 20 MG tablet Take 1 tablet (20 mg total) by mouth at bedtime. Patient taking differently: Take 40 mg by mouth at bedtime. 09/16/23   Domenica Harlene LABOR, MD  ferrous sulfate (FEROSUL) 325 (65 FE) MG tablet Take 1 tablet (325 mg total) by mouth daily with breakfast.  01/19/24   Domenica Harlene LABOR, MD  fluconazole  (DIFLUCAN ) 150 MG tablet Take by mouth. 05/13/24   [provider]  fluticasone  (FLONASE ) 50 MCG/ACT nasal spray Place 2 sprays into both nostrils daily. 01/07/20   Joy, Shawn C, PA-C  furosemide  (LASIX ) 20 MG tablet Take 20 mg by mouth daily as needed (fluid retention (feet swelling)). 07/03/20   [provider]  hydrOXYzine  (ATARAX ) 10 MG tablet TAKE ONE (1) TABLET BY MOUTH 3 TIMES DAILY AS NEEDED FOR ITCHING 03/03/23   Domenica Harlene LABOR, MD  hyoscyamine  (LEVSIN  SL) 0.125 MG SL tablet Place 1 tablet (0.125 mg total) under the tongue every 4 (four) hours as needed. 01/24/24   Domenica Harlene LABOR, MD  lidocaine  (XYLOCAINE ) 5 % ointment Apply 1 Application topically as needed. 01/31/24   Guadlupe Dull DASEN, MD  lidocaine -prilocaine  (EMLA ) cream Apply 1 Application topically as needed. 07/01/23   Timmy Maude SAUNDERS, MD  LOMOTIL  2.5-0.025 MG tablet Take 1 tablet by mouth 2 (two) times daily. 05/24/24 06/23/24  Luiz Channel, MD  loperamide  (IMODIUM ) 2 MG capsule Take 4 mg by mouth in the morning and at bedtime.    [provider]  losartan  (COZAAR ) 50 MG tablet Take 50 mg by mouth in the morning and at bedtime. 04/14/21   [provider]  Magnesium  Glycinate 100 MG CAPS Take 200 mg by mouth at bedtime. 09/21/22   [provider]  meclizine  (ANTIVERT ) 12.5 MG tablet Take 12.5 mg by mouth 2 (two) times daily as needed for dizziness or nausea. 01/28/22   [provider]  metoprolol  succinate (TOPROL -XL) 50 MG 24 hr tablet Take 1 tablet (50 mg total) by mouth 2 (two) times daily. 02/23/23   Domenica Harlene LABOR, MD  metroNIDAZOLE  (METROGEL ) 0.75 % gel Insert One applicatorful (5 g ) vaginally at bedtime for 5 days 02/28/24   Drubel, Manuelita, PA-C  montelukast  (SINGULAIR ) 10 MG tablet Take 1 tablet (10 mg total) by mouth at bedtime. Please start taking this on September 4. 07/01/23   Timmy Maude SAUNDERS, MD  Multiple Vitamins-Minerals (CENTRUM  SILVER  WOMEN 50+ PO) Take 1 tablet by mouth daily at 6 (six) AM.    [provider]  neomycin -polymyxin-hydrocortisone (CORTISPORIN) OTIC solution Place 4 drops into both ears 4 (four) times daily. 05/18/24   Domenica Harlene LABOR, MD  ondansetron  (ZOFRAN ) 8 MG tablet Take 1 tablet (8 mg total) by mouth every 8 (eight) hours as needed for nausea or vomiting. 07/14/23   Timmy Maude SAUNDERS, MD  oxyCODONE -acetaminophen  (PERCOCET) 5-325 MG tablet Take 2 tablets by mouth every 6 (six) hours as needed. 08/07/23   Patt Alm Macho, MD  pantoprazole  (PROTONIX ) 40 MG tablet Take 1 tablet (40 mg total) by mouth daily. 09/03/23   O'Sullivan, Melissa, NP  phenazopyridine  (PYRIDIUM ) 200 MG tablet Take 1  tablet (200 mg total) by mouth 3 (three) times daily as needed for pain. 01/31/24   Guadlupe Lianne DASEN, MD  potassium chloride  SA (KLOR-CON  M) 20 MEQ tablet TAKE 1 TABLET BY MOUTH EVERY MONDAY, WEDNESDAY, AND FRIDAY. 10/25/23   Domenica Harlene LABOR, MD  prochlorperazine  (COMPAZINE ) 10 MG tablet Take 0.5 tablets (5 mg total) by mouth 2 (two) times daily as needed for nausea or vomiting. 12/02/23   Domenica Harlene LABOR, MD  Respiratory Therapy Supplies (PILLOW MASK/ADULT) MISC  09/29/23   [provider]  scopolamine  (TRANSDERM-SCOP) 1 MG/3DAYS Place 1 patch (1.5 mg total) onto the skin every 3 (three) days. 07/01/23   Timmy Maude SAUNDERS, MD  sertraline  (ZOLOFT ) 50 MG tablet Take 1 tablet (50 mg total) by mouth daily. 06/14/23   Domenica Harlene LABOR, MD  spironolactone  (ALDACTONE ) 25 MG tablet Take 25 mg by mouth daily.    [provider]  sucralfate  (CARAFATE ) 1 GM/10ML suspension Take 10 mLs (1 g total) by mouth 4 (four) times daily -  with meals and at bedtime. 05/18/24   Domenica Harlene LABOR, MD  sulfamethoxazole -trimethoprim  (BACTRIM  DS) 800-160 MG tablet Take 1 tablet by mouth 2 (two) times daily. 05/17/24   [provider]  Vibegron  (GEMTESA ) 75 MG TABS Take 1 tablet (75 mg total) by mouth daily. 01/31/24   Guadlupe Lianne DASEN, MD     Allergies: Benazepril , Ondansetron  hcl, Codeine, and Morphine    Review of Systems  Cardiovascular:  Positive for chest pain.    Updated Vital Signs BP (!) 177/100   Pulse 68   Temp 98.4 F (36.9 C) (Oral)   Resp 15   Ht 5' 5.5 (1.664 m)   Wt 87.1 kg   SpO2 99%   BMI 31.47 kg/m   Physical Exam Vitals and nursing note reviewed.  Constitutional:      General: She is not in acute distress.    Appearance: She is well-developed. She is not diaphoretic.  HENT:     Head: Normocephalic and atraumatic.     Right Ear: External ear normal.     Left Ear: External ear normal.     Nose: Nose normal.     Mouth/Throat:     Mouth: Mucous membranes are moist.  Eyes:     General: No scleral icterus.    Conjunctiva/sclera: Conjunctivae normal.  Cardiovascular:     Rate and Rhythm: Normal rate and regular rhythm.     Heart sounds: Normal heart sounds. No murmur heard.    No friction rub. No gallop.  Pulmonary:     Effort: Pulmonary effort is normal. No respiratory distress.     Breath sounds: Normal breath sounds.  Chest:     Comments: Large, Unsupported hypertrophic pendulous breasts with kyphotic posture, shortened anterior chest wall muscle fibers, decreased scapular retraction. TTP in the BL trapezius and periscapular muscles  Abdominal:     General: Bowel sounds are normal. There is no distension.     Palpations: Abdomen is soft. There is no mass.     Tenderness: There is no abdominal tenderness. There is no guarding.  Musculoskeletal:     Cervical back: Normal range of motion.  Skin:    General: Skin is warm and dry.  Neurological:     Mental Status: She is alert and oriented to person, place, and time.  Psychiatric:        Behavior: Behavior normal.     (all labs ordered are listed, but only abnormal results are displayed)  Labs Reviewed  BASIC METABOLIC PANEL WITH GFR  CBC  TROPONIN T, HIGH SENSITIVITY  TROPONIN T, HIGH SENSITIVITY     EKG: None  Radiology: DG Chest 2 View Result Date: 06/04/2024 CLINICAL DATA:  Chest pain. EXAM: CHEST - 2 VIEW COMPARISON:  Chest radiograph dated 04/29/2024. FINDINGS: Right-sided Port-A-Cath in similar position. No focal consolidation, pleural effusion, or pneumothorax. Stable cardiac silhouette. Left pectoral AICD device. No acute osseous pathology. IMPRESSION: No active cardiopulmonary disease. Electronically Signed   By: Vanetta Chou M.D.   On: 06/04/2024 10:28     Procedures   Medications Ordered in the ED  sodium chloride  0.9 % bolus 1,000 mL (0 mLs Intravenous Stopped 06/04/24 1224)                HEART Score: 5                    Medical Decision Making Given the large differential diagnosis for FREDDIE NGHIEM, the decision making in this case is of high complexity.  After evaluating all of the data points in this case, the presentation of DANNISHA ECKMANN is NOT consistent with Acute Coronary Syndrome (ACS) and/or myocardial ischemia, pulmonary embolism, aortic dissection; Borhaave's, significant arrythmia, pneumothorax, cardiac tamponade, or other emergent cardiopulmonary condition.  Further, the presentation of WARREN KUGELMAN is NOT consistent with pericarditis, myocarditis, cholecystitis, pancreatitis, mediastinitis, endocarditis, new valvular disease.  Additionally, the presentation of ATARAH CADOGAN NOT consistent with flail chest, cardiac contusion, ARDS, or significant intra-thoracic or intra-abdominal bleeding.  Moreover, this presentation is NOT consistent with pneumonia, sepsis, or pyelonephritis.  The patient has a HEART Score: 5    Strict return and follow-up precautions have been given by me personally or by detailed written instruction given verbally by nursing staff using the teach back method to the patient/family/caregiver(s).  Data Reviewed/Counseling: I have reviewed the patient's vital signs, nursing notes, and other relevant  tests/information. I had a detailed discussion regarding the historical points, exam findings, and any diagnostic results supporting the discharge diagnosis. I also discussed the need for outpatient follow-up and the need to return to the ED if symptoms worsen or if there are any questions or concerns that arise at home.    Amount and/or Complexity of Data Reviewed Labs: ordered.    Details: Labs unremarkable., troponin negative x 2 Radiology: ordered and independent interpretation performed.    Details: I personally visualized and interpreted the images using our PACS system. Acute findings include:  No acure findings on cxr  ECG/medicine tests: ordered and independent interpretation performed.    Details: EKG shows NSR, LVH pattern no change from previous tracing         Final diagnoses:  Chest wall pain    ED Discharge Orders     None          Arloa Chroman, PA-C 06/04/24 1640    Ruthe Cornet, DO 06/06/24 [phone]

## 2024-06-04 NOTE — ED Notes (Signed)
 Trop recollected due to hemolyzed specimen.

## 2024-06-04 NOTE — ED Triage Notes (Addendum)
 Pt states that she has been having chest pain off and on since yesterday. States that she is also having a headache. States that she uses her Cpap and that helped when she was feeling short of breath. States that she also had an episode of diarrhea.

## 2024-06-04 NOTE — Discharge Instructions (Signed)
 ### Chest Pain Discharge Instructions     You were recently evaluated in the emergency department for chest wall and upper back pain. Here is important information about your visit, what your test results mean, and what to do next.      **What Was Done During Your Visit**      - You had a careful evaluation, including a physical exam, an electrocardiogram (ECG), blood tests for heart damage (called troponins), and a chest X-ray.      - Your ECG and chest X-ray did not show any new or concerning changes.      - Your troponin blood tests were negative, meaning there was no sign of heart muscle damage.      - Your risk for a serious heart problem was assessed using a tool called the HEART pathway, which helps doctors decide how likely it is that chest pain is related to the heart.      **What Your Results Mean**      - Your test results did not show evidence of a heart attack or other life-threatening heart problems.      - You are considered at moderate risk for heart problems based on your overall health and risk factors, but your current tests are reassuring.      - Sometimes, chest pain can be caused by other issues, such as muscle strain, inflammation of the chest wall (costochondritis), acid reflux, anxiety, or other non-cardiac causes.[1]      **What to Watch For**      Even though your tests are reassuring, it is important to pay attention to your symptoms. Return to the emergency department or call 911 if you experience:      - Chest pain that is severe, crushing, or lasts more than a few minutes      - Pain that spreads to your jaw, neck, arm, or back      - Trouble breathing, fainting, or sweating a lot      - A fast or irregular heartbeat      - New or worsening symptoms      **Next Steps and Follow-Up**      - Follow up with your primary care provider or cardiologist within the next week, or as soon as possible. This is important to review your risk factors, discuss  your symptoms, and decide if any further testing is needed.[2]      - If you have a history of heart disease, high blood pressure, high cholesterol, diabetes, or you smoke, your doctor may recommend additional tests or changes to your medications.[2][3]      - If your pain is thought to be from the chest wall or muscles, gentle stretching, over-the-counter pain relievers (such as acetaminophen  or ibuprofen , if safe for you), and applying heat or ice may help. Always check with your doctor before starting any new medication.      **How to Reduce Your Risk**      - Do not smoke, and avoid secondhand smoke.      - Eat a healthy diet, exercise regularly, and maintain a healthy weight.      - Take your medications as prescribed.      - Manage stress and get enough sleep.      **Questions or Concerns**      If you have any questions about your symptoms or your care, contact your healthcare provider. If your symptoms return or get worse, seek medical attention right away.  Your health and safety are important. Thank you for trusting your care team.      [4][2][3][5][1]

## 2024-06-04 NOTE — ED Notes (Signed)
 Discharge paperwork reviewed entirely with patient, including follow up care. Pain was under control. No prescriptions were called in, but all questions were addressed.  Pt verbalized understanding as well as all parties involved. No questions or concerns voiced at the time of discharge. No acute distress noted. Pt was encouraged to stay adequately hydrated and eat a healthy diet.   Pt ambulated out to PVA without incident or assistance.  Pt advised they will notify their PCP immediately.  The pt was instructed to set up and/or review MyChart for their results; and was informed their Providers all have access to the information as well.

## 2024-06-04 NOTE — ED Notes (Signed)
 Pt allowed me to get an IV stick after encouragement from the PA.

## 2024-06-04 NOTE — ED Notes (Signed)
 Pt is complaining of the delay. We reminded her that she is the one that delayed her own care earlier because of her refusal to allow port access; and the ER has become exponentially more busy since then.

## 2024-06-05 DIAGNOSIS — R079 Chest pain, unspecified: Secondary | ICD-10-CM | POA: Diagnosis not present

## 2024-06-05 DIAGNOSIS — R519 Headache, unspecified: Secondary | ICD-10-CM | POA: Diagnosis not present

## 2024-06-05 DIAGNOSIS — R531 Weakness: Secondary | ICD-10-CM | POA: Diagnosis not present

## 2024-06-05 DIAGNOSIS — M549 Dorsalgia, unspecified: Secondary | ICD-10-CM | POA: Diagnosis not present

## 2024-06-05 DIAGNOSIS — I493 Ventricular premature depolarization: Secondary | ICD-10-CM | POA: Diagnosis not present

## 2024-06-05 DIAGNOSIS — R197 Diarrhea, unspecified: Secondary | ICD-10-CM | POA: Diagnosis not present

## 2024-06-05 DIAGNOSIS — R112 Nausea with vomiting, unspecified: Secondary | ICD-10-CM | POA: Diagnosis not present

## 2024-06-05 LAB — KAPPA/LAMBDA LIGHT CHAINS
Kappa free light chain: 3.4 mg/L (ref 3.3–19.4)
Kappa, lambda light chain ratio: 2.27 — ABNORMAL HIGH (ref 0.26–1.65)
Lambda free light chains: 1.5 mg/L — ABNORMAL LOW (ref 5.7–26.3)

## 2024-06-06 DIAGNOSIS — H6501 Acute serous otitis media, right ear: Secondary | ICD-10-CM | POA: Diagnosis not present

## 2024-06-07 ENCOUNTER — Ambulatory Visit: Admitting: Internal Medicine

## 2024-06-07 ENCOUNTER — Encounter: Payer: Self-pay | Admitting: *Deleted

## 2024-06-07 ENCOUNTER — Ambulatory Visit: Payer: Self-pay | Admitting: Hematology & Oncology

## 2024-06-07 DIAGNOSIS — M546 Pain in thoracic spine: Secondary | ICD-10-CM | POA: Diagnosis not present

## 2024-06-07 DIAGNOSIS — H6981 Other specified disorders of Eustachian tube, right ear: Secondary | ICD-10-CM | POA: Diagnosis not present

## 2024-06-07 LAB — IMMUNOFIXATION REFLEX, SERUM
IgA: 37 mg/dL — ABNORMAL LOW (ref 87–352)
IgG (Immunoglobin G), Serum: 879 mg/dL (ref 586–1602)
IgM (Immunoglobulin M), Srm: 22 mg/dL — ABNORMAL LOW (ref 26–217)

## 2024-06-07 LAB — PROTEIN ELECTROPHORESIS, SERUM, WITH REFLEX
A/G Ratio: 1.4 (ref 0.7–1.7)
Albumin ELP: 3.9 g/dL (ref 2.9–4.4)
Alpha-1-Globulin: 0.1 g/dL (ref 0.0–0.4)
Alpha-2-Globulin: 0.9 g/dL (ref 0.4–1.0)
Beta Globulin: 1 g/dL (ref 0.7–1.3)
Gamma Globulin: 0.6 g/dL (ref 0.4–1.8)
Globulin, Total: 2.7 g/dL (ref 2.2–3.9)
M-Spike, %: 0.2 g/dL — ABNORMAL HIGH
SPEP Interpretation: 0
Total Protein ELP: 6.6 g/dL (ref 6.0–8.5)

## 2024-06-07 NOTE — Telephone Encounter (Signed)
 Patient scheduled for OV with PA

## 2024-06-08 ENCOUNTER — Encounter (HOSPITAL_COMMUNITY): Payer: Self-pay | Admitting: Emergency Medicine

## 2024-06-08 ENCOUNTER — Emergency Department (HOSPITAL_COMMUNITY)

## 2024-06-08 ENCOUNTER — Emergency Department (HOSPITAL_COMMUNITY)
Admission: EM | Admit: 2024-06-08 | Discharge: 2024-06-08 | Disposition: A | Discharge status: Home / Self Care | Attending: Student | Admitting: Student

## 2024-06-08 ENCOUNTER — Other Ambulatory Visit: Payer: Self-pay

## 2024-06-08 DIAGNOSIS — R1084 Generalized abdominal pain: Secondary | ICD-10-CM | POA: Insufficient documentation

## 2024-06-08 DIAGNOSIS — R112 Nausea with vomiting, unspecified: Secondary | ICD-10-CM | POA: Diagnosis not present

## 2024-06-08 DIAGNOSIS — R531 Weakness: Secondary | ICD-10-CM | POA: Diagnosis not present

## 2024-06-08 DIAGNOSIS — I509 Heart failure, unspecified: Secondary | ICD-10-CM | POA: Insufficient documentation

## 2024-06-08 DIAGNOSIS — R109 Unspecified abdominal pain: Secondary | ICD-10-CM | POA: Diagnosis not present

## 2024-06-08 DIAGNOSIS — R197 Diarrhea, unspecified: Secondary | ICD-10-CM | POA: Insufficient documentation

## 2024-06-08 DIAGNOSIS — J449 Chronic obstructive pulmonary disease, unspecified: Secondary | ICD-10-CM | POA: Insufficient documentation

## 2024-06-08 DIAGNOSIS — I110 Hypertensive heart disease with heart failure: Secondary | ICD-10-CM | POA: Insufficient documentation

## 2024-06-08 DIAGNOSIS — M546 Pain in thoracic spine: Secondary | ICD-10-CM | POA: Insufficient documentation

## 2024-06-08 DIAGNOSIS — Z743 Need for continuous supervision: Secondary | ICD-10-CM | POA: Diagnosis not present

## 2024-06-08 DIAGNOSIS — Z7982 Long term (current) use of aspirin: Secondary | ICD-10-CM | POA: Diagnosis not present

## 2024-06-08 DIAGNOSIS — Z8673 Personal history of transient ischemic attack (TIA), and cerebral infarction without residual deficits: Secondary | ICD-10-CM | POA: Insufficient documentation

## 2024-06-08 DIAGNOSIS — M4184 Other forms of scoliosis, thoracic region: Secondary | ICD-10-CM | POA: Diagnosis not present

## 2024-06-08 DIAGNOSIS — Z79899 Other long term (current) drug therapy: Secondary | ICD-10-CM | POA: Diagnosis not present

## 2024-06-08 DIAGNOSIS — R1110 Vomiting, unspecified: Secondary | ICD-10-CM

## 2024-06-08 LAB — URINALYSIS, ROUTINE W REFLEX MICROSCOPIC
Bacteria, UA: NONE SEEN
Bilirubin Urine: NEGATIVE
Glucose, UA: NEGATIVE mg/dL
Hgb urine dipstick: NEGATIVE
Ketones, ur: NEGATIVE mg/dL
Nitrite: NEGATIVE
Protein, ur: NEGATIVE mg/dL
Specific Gravity, Urine: 1.009 (ref 1.005–1.030)
pH: 5 (ref 5.0–8.0)

## 2024-06-08 LAB — COMPREHENSIVE METABOLIC PANEL WITH GFR
ALT: 18 U/L (ref 0–44)
AST: 20 U/L (ref 15–41)
Albumin: 2.8 g/dL — ABNORMAL LOW (ref 3.5–5.0)
Alkaline Phosphatase: 32 U/L — ABNORMAL LOW (ref 38–126)
Anion gap: 7 (ref 5–15)
BUN: 19 mg/dL (ref 8–23)
CO2: 20 mmol/L — ABNORMAL LOW (ref 22–32)
Calcium: 7.5 mg/dL — ABNORMAL LOW (ref 8.9–10.3)
Chloride: 115 mmol/L — ABNORMAL HIGH (ref 98–111)
Creatinine, Ser: 0.66 mg/dL (ref 0.44–1.00)
GFR, Estimated: >60 mL/min (ref >60)
Glucose, Bld: 92 mg/dL (ref 70–99)
Potassium: 4.5 mmol/L (ref 3.5–5.1)
Sodium: 142 mmol/L (ref 135–145)
Total Bilirubin: 1 mg/dL (ref 0.0–1.2)
Total Protein: 5.5 g/dL — ABNORMAL LOW (ref 6.5–8.1)

## 2024-06-08 LAB — CBC WITH DIFFERENTIAL/PLATELET
Abs Immature Granulocytes: 0.02 K/uL (ref 0.00–0.07)
Basophils Absolute: 0 K/uL (ref 0.0–0.1)
Basophils Relative: 0 %
Eosinophils Absolute: 0.1 K/uL (ref 0.0–0.5)
Eosinophils Relative: 1 %
HCT: 42.1 % (ref 36.0–46.0)
Hemoglobin: 13.1 g/dL (ref 12.0–15.0)
Immature Granulocytes: 0 %
Lymphocytes Relative: 26 %
Lymphs Abs: 1.5 K/uL (ref 0.7–4.0)
MCH: 29.4 pg (ref 26.0–34.0)
MCHC: 31.1 g/dL (ref 30.0–36.0)
MCV: 94.4 fL (ref 80.0–100.0)
Monocytes Absolute: 0.4 K/uL (ref 0.1–1.0)
Monocytes Relative: 7 %
Neutro Abs: 3.9 K/uL (ref 1.7–7.7)
Neutrophils Relative %: 66 %
Platelets: 132 K/uL — ABNORMAL LOW (ref 150–400)
RBC: 4.46 MIL/uL (ref 3.87–5.11)
RDW: 12.9 % (ref 11.5–15.5)
WBC: 5.8 K/uL (ref 4.0–10.5)
nRBC: 0 % (ref 0.0–0.2)

## 2024-06-08 LAB — LIPASE, BLOOD: Lipase: 29 U/L (ref 11–51)

## 2024-06-08 MED ORDER — LACTATED RINGERS IV BOLUS
1000.0000 mL | Freq: Once | INTRAVENOUS | Status: AC
  Administered 2024-06-08: 1000 mL via INTRAVENOUS

## 2024-06-08 MED ORDER — PROCHLORPERAZINE EDISYLATE 10 MG/2ML IJ SOLN
10.0000 mg | Freq: Once | INTRAMUSCULAR | Status: AC
  Administered 2024-06-08: 10 mg via INTRAVENOUS
  Filled 2024-06-08: qty 2

## 2024-06-08 MED ORDER — DIPHENHYDRAMINE HCL 50 MG/ML IJ SOLN
25.0000 mg | Freq: Once | INTRAMUSCULAR | Status: AC
  Administered 2024-06-08: 25 mg via INTRAVENOUS
  Filled 2024-06-08: qty 1

## 2024-06-08 MED ORDER — DICYCLOMINE HCL 10 MG PO CAPS
20.0000 mg | ORAL_CAPSULE | Freq: Once | ORAL | Status: DC
  Filled 2024-06-08: qty 2

## 2024-06-08 MED ORDER — DICYCLOMINE HCL 20 MG PO TABS: 20.0000 mg | ORAL_TABLET | Freq: Two times a day (BID) | ORAL | 0 refills | Status: DC

## 2024-06-08 NOTE — ED Triage Notes (Signed)
 Patient presents from home due to abdominal pain, nausea, vomiting and diarrhea. She has had abdominal pain for the past year, but nausea vomiting and diarrhea started last Tuesday. [facility] a [facility] this past Sunday and Monday for the same. Patient has had low PO intake and weakness as well. She denies blood in the stool and vomit. EMS administered 4 mg of Zofran  and 300 ml of fluid. Interventions did not help. She is a cancer patient who was last treated about 5 months ago for lymphoma.      EMS vitals: 90 HR 95% SPO2 on room air 124/66 BP 127 CBG

## 2024-06-08 NOTE — ED Notes (Signed)
 Patient left before their requested dose of Bentyl  was given.

## 2024-06-08 NOTE — ED Provider Notes (Signed)
 [place] [facility] AT [facility] Provider Note  CSN: [id number] Arrival date & time: 06/08/24 1500  Chief Complaint(s) No chief complaint on file.  HPI PENNI [name] is a 68 y.o. female with PMH multiple myeloma, hypogammaglobulinemia, CHF, COPD, previous CVA, HTN, GERD who presents emergency room for evaluation of multiple complaints including back pain, epigastric abdominal pain, nausea, vomiting, diarrhea.  States that symptoms have been on and off for the last year but over the last 3 days she has had progressive worsening nausea, vomiting and diarrhea.  Having difficulty tolerating p.o.  Additional history obtained from caregiver in [place]  and the [place] who states that she is primarily concerned about patient's complaints of thoracic back pain and is concerned that she may have lytic lesions in her spine.  Patient denies chest pain, shortness of breath, headache, fever or other systemic symptoms.   Past Medical History Past Medical History:  Diagnosis Date   Abnormal SPEP 07/26/2016   Arthritis    knees, hands   Back pain 07/14/2016   Bowel obstruction (HCC) 11/2014   C. 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   Multiple myeloma (HCC) 07/20/2018   Multiple myeloma not having achieved remission (HCC) 07/20/2018   Myalgia 12/31/2016   Obstructive sleep apnea 03/31/2015   Presence of permanent cardiac pacemaker    SOB (shortness of breath) 04/09/2016   Stroke (HCC)    TIAs   UTI (urinary tract  infection)    Patient Active Problem List   Diagnosis Date Noted   Stress incontinence 01/31/2024   Nocturia 01/31/2024   Pelvic pain 01/31/2024   Incontinence of feces 01/31/2024   Vaginal atrophy 01/31/2024   Vaginal discharge 01/31/2024   Acute pain of left shoulder 09/03/2023   Chronic combined systolic and diastolic congestive heart failure (HCC) 03/28/2023   Intractable nausea and vomiting 03/27/2023   Asthma 12/20/2022   Diarrhea    Hypocalcemia 08/25/2022   Iron  deficiency anemia due to chronic blood loss 05/28/2022   Chronic diarrhea 05/27/2022   Soft tissue lesion 04/28/2022   Obesity (BMI 30-39.9) 04/28/2022   SBO (small bowel obstruction) (HCC) 04/27/2022   Dehydration 04/03/2022   Acute vaginitis 03/18/2022   Cervical cancer screening 03/18/2022   Dizziness 12/11/2021   Visual changes 11/26/2021   Ear fullness, bilateral 08/12/2021   Bilateral knee pain 08/08/2021   History of 2019 novel coronavirus disease (COVID-19) 12/06/2020   Vitamin deficiency 07/31/2020   Acute combined systolic and diastolic heart failure (HCC) 05/09/2020   Hypomagnesemia 03/04/2020   Thrombocytopenia (HCC) 03/04/2020   Dysphagia 03/04/2020   Constipation 03/04/2020   Sensation of pressure in bladder area 01/17/2020   Hyperglycemia 01/17/2020   Peripheral neuropathy 11/15/2019   Chronic migraine without aura, with intractable migraine, so stated, with status migrainosus 10/17/2019   ETD (Eustachian tube dysfunction), bilateral 09/12/2019   Urinary frequency 07/17/2019   Headache 07/17/2019   Palpitation 05/15/2019   Clostridium difficile diarrhea 09/04/2018   Thyroid  nodule 08/26/2018  Multiple myeloma (HCC) 07/20/2018   Multiple myeloma not having achieved remission (HCC) 07/20/2018   Hematuria 07/11/2018   Seasonal allergies 06/23/2018   Right shoulder pain 06/07/2018   Neck pain 06/07/2018   Paresthesias 06/07/2018   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   Pain in thoracic  spine 03/18/2018   Thoracic back pain 02/15/2018   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   Hypokalemia 08/20/2017   Anemia 08/20/2017   Elevated sed rate 08/15/2017   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   Epigastric abdominal pain 03/22/2017   Myalgia 12/31/2016   Abnormal SPEP 07/26/2016   Low back pain 07/14/2016   Insomnia 12/29/2015   Tension headache 10/21/2015   Muscle spasms of neck 10/18/2015   Dysuria 10/13/2015   AP (abdominal pain) 07/07/2015   Cardiomyopathy (HCC) 04/19/2015   Chest pain 04/19/2015   Obstructive sleep apnea 03/31/2015   Congestive heart failure (HCC) 02/24/2015   Arthritis    Hyperlipidemia    Hypertension    Depression    Stroke [facility])    GERD (gastroesophageal reflux disease)    Bowel obstruction (HCC) 11/09/2014   Home Medication(s) Prior to Admission medications   Medication Sig Start Date End Date Taking? Authorizing Provider  dicyclomine  (BENTYL ) 20 MG tablet Take 1 tablet (20 mg total) by mouth 2 (two) times daily. 06/08/24  Yes Idelia Caudell, MD  acetaminophen  (TYLENOL ) 500 MG tablet Take 500 mg by mouth every 6 (six) hours as needed.    [provider]  acyclovir  (ZOVIRAX ) 400 MG tablet TAKE ONE (1) TABLET BY MOUTH TWO (2) TIMES DAILY 02/08/24   Timmy Maude SAUNDERS, MD  albuterol  (PROVENTIL ) (2.5 MG/3ML) 0.083% nebulizer solution Take 3 mLs (2.5 mg total) by nebulization every 6 (six) hours as needed for wheezing or shortness of breath. 12/14/23   Kara Dorn NOVAK, MD  albuterol  (VENTOLIN  HFA) 108 (90 Base) MCG/ACT inhaler Inhale 2 puffs into the lungs every 6 (six) hours as needed for wheezing or shortness of breath. 12/14/23   Kara Dorn NOVAK, MD  amLODipine  (NORVASC ) 5 MG tablet Take 5 mg by mouth daily. Patient not taking: Reported on 06/02/2024 09/14/23   [provider]  aspirin  EC 81 MG tablet Take 81 mg by mouth daily. Swallow whole.    [provider]  budesonide  (PULMICORT ) 0.5  MG/2ML nebulizer solution Take 2 mLs (0.5 mg total) by nebulization 2 (two) times daily as needed (cough, wheezing or shortness of breath). 12/14/23   Kara Dorn NOVAK, MD  cetirizine  (ZYRTEC ) 10 MG tablet Take 10 mg by mouth at bedtime.    [provider]  Cholecalciferol (VITAMIN D3) 50 MCG (2000 UT) TABS Take 2,000 Units by mouth in the morning.    [provider]  cholestyramine  light (PREVALITE ) 4 g packet Take 4 g by mouth 2 (two) times daily.    [provider]  ciprofloxacin  (CIPRO ) 500 MG tablet Take 500 mg by mouth 2 (two) times daily. 05/30/24   [provider]  cyclobenzaprine  (FLEXERIL ) 5 MG tablet Take 1 tablet (5 mg total) by mouth 2 (two) times daily as needed for up to 10 doses for muscle spasms. 01/03/24   Curatolo, Adam, DO  diphenhydrAMINE  (BENADRYL ) 25 MG tablet Take 25 mg by mouth. 01/28/22   [provider]  estradiol  (ESTRACE ) 0.1 MG/GM vaginal cream Place 0.5 g vaginally 2 (two) times a week. Place 0.5g nightly for two weeks then twice a week after 02/03/24   Guadlupe Lianne DASEN, MD  famotidine  (PEPCID ) 20 MG tablet Take 1 tablet (20 mg total) by mouth at bedtime. Patient taking differently: Take 40 mg by mouth at bedtime. 09/16/23   Domenica Harlene LABOR, MD  ferrous sulfate (FEROSUL) 325 (65 FE) MG tablet Take 1 tablet (325 mg total) by mouth daily with breakfast. 01/19/24   Domenica Harlene LABOR, MD  fluconazole  (DIFLUCAN ) 150 MG tablet Take by mouth. 05/13/24   [provider]  fluticasone  (FLONASE ) 50 MCG/ACT nasal spray Place 2 sprays into both nostrils daily. 01/07/20   Joy, Shawn C, PA-C  furosemide  (LASIX ) 20 MG tablet Take 20 mg by mouth daily as needed (fluid retention (feet swelling)). 07/03/20   [provider]  hydrOXYzine  (ATARAX ) 10 MG tablet TAKE ONE (1) TABLET BY MOUTH 3 TIMES DAILY AS NEEDED FOR ITCHING 03/03/23   Domenica Harlene LABOR, MD  hyoscyamine  (LEVSIN  SL) 0.125 MG SL tablet Place 1 tablet (0.125 mg total) under the tongue  every 4 (four) hours as needed. 01/24/24   Domenica Harlene LABOR, MD  lidocaine  (XYLOCAINE ) 5 % ointment Apply 1 Application topically as needed. 01/31/24   Guadlupe Lianne DASEN, MD  lidocaine -prilocaine  (EMLA ) cream Apply 1 Application topically as needed. 07/01/23   Timmy Maude SAUNDERS, MD  LOMOTIL  2.5-0.025 MG tablet Take 1 tablet by mouth 2 (two) times daily. 05/24/24 06/23/24  Luiz Channel, MD  loperamide  (IMODIUM ) 2 MG capsule Take 4 mg by mouth in the morning and at bedtime.    [provider]  losartan  (COZAAR ) 50 MG tablet Take 50 mg by mouth in the morning and at bedtime. 04/14/21   [provider]  Magnesium  Glycinate 100 MG CAPS Take 200 mg by mouth at bedtime. 09/21/22   [provider]  meclizine  (ANTIVERT ) 12.5 MG tablet Take 12.5 mg by mouth 2 (two) times daily as needed for dizziness or nausea. 01/28/22   [provider]  metoprolol  succinate (TOPROL -XL) 50 MG 24 hr tablet Take 1 tablet (50 mg total) by mouth 2 (two) times daily. 02/23/23   Domenica Harlene LABOR, MD  metroNIDAZOLE  (METROGEL ) 0.75 % gel Insert One applicatorful (5 g ) vaginally at bedtime for 5 days 02/28/24   Drubel, Manuelita, PA-C  montelukast  (SINGULAIR ) 10 MG tablet Take 1 tablet (10 mg total) by mouth at bedtime. Please start taking this on September 4. 07/01/23   Timmy Maude SAUNDERS, MD  Multiple Vitamins-Minerals (CENTRUM SILVER  WOMEN 50+ PO) Take 1 tablet by mouth daily at 6 (six) AM.    [provider]  neomycin -polymyxin-hydrocortisone (CORTISPORIN) OTIC solution Place 4 drops into both ears 4 (four) times daily. 05/18/24   Domenica Harlene LABOR, MD  ondansetron  (ZOFRAN ) 8 MG tablet Take 1 tablet (8 mg total) by mouth every 8 (eight) hours as needed for nausea or vomiting. 07/14/23   Timmy Maude SAUNDERS, MD  oxyCODONE -acetaminophen  (PERCOCET) 5-325 MG tablet Take 2 tablets by mouth every 6 (six) hours as needed. 08/07/23   Patt Alm Macho, MD  pantoprazole  (PROTONIX ) 40 MG tablet Take 1 tablet (40 mg total) by  mouth daily. 09/03/23   O'Sullivan, Melissa, NP  phenazopyridine  (PYRIDIUM ) 200 MG tablet Take 1 tablet (200 mg total) by mouth 3 (three) times daily as needed for pain. 01/31/24   Guadlupe Lianne DASEN, MD  potassium chloride  SA (KLOR-CON  M) 20 MEQ tablet TAKE 1 TABLET BY MOUTH EVERY MONDAY, WEDNESDAY, AND FRIDAY. 10/25/23   Domenica Harlene LABOR, MD  prochlorperazine  (COMPAZINE ) 10 MG tablet Take 0.5 tablets (5 mg total) by mouth 2 (two)  times daily as needed for nausea or vomiting. 12/02/23   Domenica Harlene LABOR, MD  Respiratory Therapy Supplies (PILLOW MASK/ADULT) MISC  09/29/23   [provider]  scopolamine  (TRANSDERM-SCOP) 1 MG/3DAYS Place 1 patch (1.5 mg total) onto the skin every 3 (three) days. 07/01/23   Timmy Maude SAUNDERS, MD  sertraline  (ZOLOFT ) 50 MG tablet Take 1 tablet (50 mg total) by mouth daily. 06/14/23   Domenica Harlene LABOR, MD  spironolactone  (ALDACTONE ) 25 MG tablet Take 25 mg by mouth daily.    [provider]  sucralfate  (CARAFATE ) 1 GM/10ML suspension Take 10 mLs (1 g total) by mouth 4 (four) times daily -  with meals and at bedtime. 05/18/24   Domenica Harlene LABOR, MD  sulfamethoxazole -trimethoprim  (BACTRIM  DS) 800-160 MG tablet Take 1 tablet by mouth 2 (two) times daily. 05/17/24   [provider]  Vibegron  (GEMTESA ) 75 MG TABS Take 1 tablet (75 mg total) by mouth daily. 01/31/24   Guadlupe Lianne DASEN, MD                                                                                                                                    Past Surgical History Past Surgical History:  Procedure Laterality Date   ABDOMINAL HYSTERECTOMY     menorraghia, 2006, total   BIOPSY  09/01/2022   Procedure: BIOPSY;  Surgeon: Charlanne Groom, MD;  Location: [facility];  Service: Gastroenterology;;   CHOLECYSTECTOMY     COLONOSCOPY  2018   [facility] per patient   COLONOSCOPY WITH PROPOFOL  N/A 09/01/2022   Procedure: COLONOSCOPY WITH PROPOFOL ;  Surgeon: Charlanne Groom, MD;  Location: [facility]  ENDOSCOPY;  Service: Gastroenterology;  Laterality: N/A;   ESOPHAGOGASTRODUODENOSCOPY  2018   [facility]    ESOPHAGOGASTRODUODENOSCOPY (EGD) WITH PROPOFOL  N/A 09/01/2022   Procedure: ESOPHAGOGASTRODUODENOSCOPY (EGD) WITH PROPOFOL ;  Surgeon: Charlanne Groom, MD;  Location: [facility] ENDOSCOPY;  Service: Gastroenterology;  Laterality: N/A;   IR IMAGING GUIDED PORT INSERTION  06/29/2023   KNEE SURGERY     right, repair torn torn cartialage   PACEMAKER INSERTION  08/2014   changed last in 2023 per pt at [facility]   TONSILLECTOMY     TUBAL LIGATION     Family History Family History  Problem Relation Age of Onset   Diabetes Mother    Hypertension Mother    Heart disease Mother        s/p 1 stent   Hyperlipidemia Mother    Arthritis Mother    Colon cancer Father 67   Irritable bowel syndrome Sister    Hypertension Maternal Grandmother    Arthritis Maternal Grandmother    Heart disease Maternal Grandfather        MI   Hypertension Maternal Grandfather    Arthritis Maternal Grandfather    Hyperlipidemia Daughter    Hypertension Daughter    Hypertension Son    Esophageal cancer Neg Hx    Uterine  cancer Neg Hx    Pancreatic cancer Neg Hx     Social History Social History   Tobacco Use   Smoking status: Never    Passive exposure: Never   Smokeless tobacco: Never  Vaping Use   Vaping status: Never Used  Substance Use Topics   Alcohol use: No   Drug use: No   Allergies Benazepril , Ondansetron  hcl, Codeine, and Morphine  Review of Systems Review of Systems  Gastrointestinal:  Positive for abdominal pain, diarrhea, nausea and vomiting.  Musculoskeletal:  Positive for back pain.    Physical Exam Vital Signs  I have reviewed the triage vital signs BP (!) 164/109   Pulse 86   Temp 97.8 F (36.6 C) (Oral)   Resp 16   SpO2 100%   Physical Exam Vitals and nursing note reviewed.  Constitutional:      General: She is not in acute distress.    Appearance: She is  well-developed.  HENT:     Head: Normocephalic and atraumatic.  Eyes:     Conjunctiva/sclera: Conjunctivae normal.  Cardiovascular:     Rate and Rhythm: Normal rate and regular rhythm.     Heart sounds: No murmur heard. Pulmonary:     Effort: Pulmonary effort is normal. No respiratory distress.     Breath sounds: Normal breath sounds.  Abdominal:     Palpations: Abdomen is soft.     Tenderness: There is no abdominal tenderness.  Musculoskeletal:        General: Tenderness present. No swelling.     Cervical back: Neck supple.  Skin:    General: Skin is warm and dry.     Capillary Refill: Capillary refill takes less than 2 seconds.  Neurological:     Mental Status: She is alert.  Psychiatric:        Mood and Affect: Mood normal.     ED Results and Treatments Labs (all labs ordered are listed, but only abnormal results are displayed) Labs Reviewed  COMPREHENSIVE METABOLIC PANEL WITH GFR - Abnormal; Notable for the following components:      Result Value   Chloride 115 (*)    CO2 20 (*)    Calcium 7.5 (*)    Total Protein 5.5 (*)    Albumin 2.8 (*)    Alkaline Phosphatase 32 (*)    All other components within normal limits  CBC WITH DIFFERENTIAL/PLATELET - Abnormal; Notable for the following components:   Platelets 132 (*)    All other components within normal limits  URINALYSIS, ROUTINE W REFLEX MICROSCOPIC - Abnormal; Notable for the following components:   Color, Urine STRAW (*)    Leukocytes,Ua TRACE (*)    All other components within normal limits  LIPASE, BLOOD                                                                                                                          Radiology CT Thoracic Spine Wo Contrast Result Date: 06/08/2024 CLINICAL  DATA:  Mid-back pain EXAM: CT THORACIC SPINE WITHOUT CONTRAST TECHNIQUE: Multidetector CT images of the thoracic were obtained using the standard protocol without intravenous contrast. RADIATION DOSE REDUCTION: This  exam was performed according to the departmental dose-optimization program which includes automated exposure control, adjustment of the mA and/or kV according to patient size and/or use of iterative reconstruction technique. COMPARISON:  Thoracic spine series dated March 08, 2024. CT of the thoracic spine dated January 03, 2024. FINDINGS: Alignment: Mild to moderate dextroscoliosis. Vertebrae: No fractures or osseous lesions. The bones appear to be normally mineralized. Paraspinal and other soft tissues: The paraspinous soft tissues are unremarkable. There are hazy and reticular opacities present dependently within the lower lobes. Disc levels: The disc spaces appear to be satisfactory preserved. There is no obvious disc herniation or significant spinal canal or neural foraminal stenosis. IMPRESSION: Mild to moderate dextroscoliosis.  No apparent acute process. Electronically Signed   By: Evalene Coho M.D.   On: 06/08/2024 17:51    Pertinent labs & imaging results that were available during my care of the patient were reviewed by me and considered in my medical decision making (see MDM for details).  Medications Ordered in ED Medications  prochlorperazine  (COMPAZINE ) injection 10 mg (10 mg Intravenous Given 06/08/24 1622)  diphenhydrAMINE  (BENADRYL ) injection 25 mg (25 mg Intravenous Given 06/08/24 1622)  lactated ringers  bolus 1,000 mL (0 mLs Intravenous Stopped 06/08/24 1835)                                                                                                                                     Procedures Procedures  (including critical care time)  Medical Decision Making / ED Course   This patient presents to the ED for concern of back pain, abdominal pain, nausea, vomiting, diarrhea, this involves an extensive number of treatment options, and is a complaint that carries with it a high risk of complications and morbidity.  The differential diagnosis includes gastroenteritis,  peptic ulcer disease, pancreatitis, metastatic disease, referred pain  MDM: Patient seen emerged part for evaluation of multiple complaints described above.  Physical exam with mild tenderness in the T-spine but abdominal exam is soft and benign, nontender.  Laboratory evaluation is largely unremarkable.  Patient previously this month received a CT angio of the abdomen that was negative for acute mesenteric ischemia and as the symptoms appear similar to them we will not repeat abdominal imaging.  However given caretaker concern for thoracic pathology, CT T-spine was obtained that shows some mild dextroscoliosis but is otherwise unremarkable.  No lytic lesions.  Patient given antiemetics and fluid resuscitation and on reevaluation her symptoms have significant proved.  She has close follow-up with 2 GI providers, 1 tomorrow and 1 in 5 days and may benefit from endoscopy to rule out peptic ulcer disease.  At this time she does not meet inpatient criteria for admission and will be discharged outpatient follow-up.  Return precautions given which she voiced understanding.   Additional history obtained: -Additional history obtained from caretaker -External records from outside source obtained and reviewed including: Chart review including previous notes, labs, imaging, consultation notes   Lab Tests: -I ordered, reviewed, and interpreted labs.   The pertinent results include:   Labs Reviewed  COMPREHENSIVE METABOLIC PANEL WITH GFR - Abnormal; Notable for the following components:      Result Value   Chloride 115 (*)    CO2 20 (*)    Calcium 7.5 (*)    Total Protein 5.5 (*)    Albumin 2.8 (*)    Alkaline Phosphatase 32 (*)    All other components within normal limits  CBC WITH DIFFERENTIAL/PLATELET - Abnormal; Notable for the following components:   Platelets 132 (*)    All other components within normal limits  URINALYSIS, ROUTINE W REFLEX MICROSCOPIC - Abnormal; Notable for the following  components:   Color, Urine STRAW (*)    Leukocytes,Ua TRACE (*)    All other components within normal limits  LIPASE, BLOOD     Imaging Studies ordered: I ordered imaging studies including CT T-spine I independently visualized and interpreted imaging. I agree with the radiologist interpretation   Medicines ordered and prescription drug management: Meds ordered this encounter  Medications   prochlorperazine  (COMPAZINE ) injection 10 mg   diphenhydrAMINE  (BENADRYL ) injection 25 mg   lactated ringers  bolus 1,000 mL   dicyclomine  (BENTYL ) 20 MG tablet    Sig: Take 1 tablet (20 mg total) by mouth 2 (two) times daily.    Dispense:  20 tablet    Refill:  0   DISCONTD: dicyclomine  (BENTYL ) capsule 20 mg    -I have reviewed the patients home medicines and have made adjustments as needed  Critical interventions none    Cardiac Monitoring: The patient was maintained on a cardiac monitor.  I personally viewed and interpreted the cardiac monitored which showed an underlying rhythm of: NSR  Social Determinants of Health:  Factors impacting patients care include: none   Reevaluation: After the interventions noted above, I reevaluated the patient and found that they have :improved  Co morbidities that complicate the patient evaluation  Past Medical History:  Diagnosis Date   Abnormal SPEP 07/26/2016   Arthritis    knees, hands   Back pain 07/14/2016   Bowel obstruction (HCC) 11/2014   C. 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/2016   Multiple myeloma (HCC) 07/20/2018   Multiple myeloma not having achieved remission (HCC) 07/20/2018   Myalgia 12/31/2016   Obstructive sleep apnea 03/31/2015   Presence of permanent cardiac pacemaker    SOB (shortness of breath) 04/09/2016   Stroke (HCC)    TIAs   UTI (urinary tract infection)       Dispostion: I considered admission for this patient, at this time she does not meet inpatient criteria for admission and will be discharged outpatient follow-up.     Final Clinical Impression(s) / ED Diagnoses Final diagnoses:  Generalized abdominal pain  Acute midline thoracic back pain  Vomiting, unspecified vomiting type, unspecified whether nausea present     @PCDICTATION @    Albertina Dixon, MD 06/09/24 1306

## 2024-06-09 ENCOUNTER — Telehealth: Payer: Self-pay

## 2024-06-09 NOTE — Telephone Encounter (Signed)
 Appt scheduled for Thursday   Copied from CRM #[id number]. Topic: Appointments - Scheduling Inquiry for Clinic >> Jun 09, 2024  4:24 PM Jasmin G wrote: Reason for CRM: Pt called to schedule a hospital follow up appt, she was told by hospital that she needs to be seen on Monday or Tuesday, upon informing her on the PCP's next available she stated that would like to come see one of the nurse's that is under her, I do not know who they are so please call pt back ASAP to schedule appt

## 2024-06-11 DIAGNOSIS — G4733 Obstructive sleep apnea (adult) (pediatric): Secondary | ICD-10-CM | POA: Diagnosis not present

## 2024-06-12 ENCOUNTER — Other Ambulatory Visit: Payer: Self-pay | Admitting: Obstetrics

## 2024-06-12 DIAGNOSIS — N3946 Mixed incontinence: Secondary | ICD-10-CM

## 2024-06-13 ENCOUNTER — Ambulatory Visit (INDEPENDENT_AMBULATORY_CARE_PROVIDER_SITE_OTHER): Admitting: Gastroenterology

## 2024-06-13 ENCOUNTER — Encounter: Payer: Self-pay | Admitting: Gastroenterology

## 2024-06-13 VITALS — BP 108/80 | HR 52 | Ht 64.57 in | Wt 191.0 lb

## 2024-06-13 DIAGNOSIS — R197 Diarrhea, unspecified: Secondary | ICD-10-CM

## 2024-06-13 DIAGNOSIS — R1013 Epigastric pain: Secondary | ICD-10-CM

## 2024-06-13 DIAGNOSIS — R630 Anorexia: Secondary | ICD-10-CM

## 2024-06-13 DIAGNOSIS — K219 Gastro-esophageal reflux disease without esophagitis: Secondary | ICD-10-CM | POA: Diagnosis not present

## 2024-06-13 DIAGNOSIS — R112 Nausea with vomiting, unspecified: Secondary | ICD-10-CM

## 2024-06-13 DIAGNOSIS — G8929 Other chronic pain: Secondary | ICD-10-CM

## 2024-06-13 DIAGNOSIS — K529 Noninfective gastroenteritis and colitis, unspecified: Secondary | ICD-10-CM

## 2024-06-13 MED ORDER — DICYCLOMINE HCL 20 MG PO TABS: 20.0000 mg | ORAL_TABLET | Freq: Three times a day (TID) | ORAL | 2 refills | Status: DC

## 2024-06-13 NOTE — Patient Instructions (Addendum)
 Recommend low fod map diet Schedule Dicyclomine  1 tablet three times daily Continue compazine  for nausea  Continue Pantoprazole  40 mg po daily  Continue Pepcid  20 mg po daily  We will be in contact with you when we are able to schedule you for your endoscopy.   _______________________________________________________  If your blood pressure at your visit was 140/90 or greater, please contact your primary care physician to follow up on this.  _______________________________________________________  If you are age 29 or older, your body mass index should be between 23-30. Your Body mass index is 32.21 kg/m. If this is out of the aforementioned range listed, please consider follow up with your Primary Care Provider.  If you are age 46 or younger, your body mass index should be between 19-25. Your Body mass index is 32.21 kg/m. If this is out of the aformentioned range listed, please consider follow up with your Primary Care Provider.   ________________________________________________________  The [facility] providers would like to encourage you to use MYCHART to communicate with providers for non-urgent requests or questions.  Due to long hold times on the telephone, sending your provider a message by [facility] may be a faster and more efficient way to get a response.  Please allow 48 business hours for a response.  Please remember that this is for non-urgent requests.  _______________________________________________________  [facility] is using a team-based approach to care.  Your team is made up of your doctor and two to three APPS. Our APPS (Nurse Practitioners and Physician Assistants) work with your physician to ensure care continuity for you. They are fully qualified to address your health concerns and develop a treatment plan. They communicate directly with your gastroenterologist to care for you. Seeing the Advanced Practice Practitioners on your physician's team can help you  by facilitating care more promptly, often allowing for earlier appointments, access to diagnostic testing, procedures, and other specialty referrals.   Thank you for trusting me with your gastrointestinal care. Deanna May, FNP-C

## 2024-06-13 NOTE — Progress Notes (Unsigned)
 Subjective:     [name], female    DOB: [birth year], 68 y.o.   MRN: [id number]  No chief complaint on file.   HPI Tami [name] is a 68yo F presents for follow up on chronic conditions, and recently seen in emergency room.   Followed by cardiology, urogyn, infectious disease, hematology/oncology, GI  Seen in ED on 06/08/2024 for abdominal pain. Per ED note: Patient evaluated for multiple symptoms with mild thoracic spine tenderness and a benign abdominal exam; labs and recent abdominal CT were unremarkable. CT T-spine showedmild dextroscoliosis, and symptoms improved with fluids and antiemetic  Chronic Diarrhea/ N/V/ Abdominal Pain- followed by GI Last seen by GI 06/13/24- Started on  Dicyclomine  (Bentyl ) 1 tablet three times daily Continue compazine  for nausea  Continue Pantoprazole  40 mg po daily  Continue Pepcid  20 mg po daily  She continues to experience intermittent diarrhea and has required fluid replacement. Bowel movements are now typically 3-4/ soft stools per day, though she reports increased over the past few days, denies recent antibiotics and stool incontinence. She reports taking Imodium  twice daily, and dycylomine (Bentyl ) 10 mg twice daily, and Pantoprazole  40 mg daily. Bowel movements usually occur after meals. +abdominal cramping. Denies loss of appetite and making efforts to maintain hydration and electrolyte balance.   Seen by GI February 2025 GI Plan: 12/2023 1.) Continue omeprazole  20mg  every day 2.) Obtain mesenteric duplex ultrasound 3.) Will try nortriptyline  25mg  at bedtime. Continue Bentyl  20mg  QID and Lomotil  (2) QID prn.  4.) Will follow up via portal with ultrasound result   Hypomagnesia She continues takes magnesium  glycinate 200 mg daily spilt in AM and HS per Heme/Onc. Pt is also receiving magnesium  IVPB infusions. Magnesium  levels have been stable.  Multiple Myeloma Patient has not had chemotherapy treatments since January per  oncology.  Hypertension- followed by cardiology [facility] Amlodipine  5 mg daily Losartan  (Cozaar ) 50 mg twice daily Metoprolol  50 mg twice daily Spirolactone 25 mg daily  Compliant with medications  Patient denies fever, chills, SOB, CP, palpitations, dyspnea, edema, HA, vision changes, N/V, urinary symptoms, rash, weight changes, and recent illness or hospitalizations.    BP Readings from Last 1 Encounters:  06/13/24 108/80    History of Present Illness              Health Maintenance Due  Topic Date Due   MAMMOGRAM  04/03/2023   DEXA SCAN  11/07/2023   INFLUENZA VACCINE  06/09/2024   Medicare Annual Wellness (AWV)  07/07/2024    Past Medical History:  Diagnosis Date   Abnormal SPEP 07/26/2016   Arthritis    knees, hands   Back pain 07/14/2016   Bowel obstruction (HCC) 11/2014   C. 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   Multiple myeloma (HCC) 07/20/2018   Multiple myeloma not having achieved remission (HCC) 07/20/2018   Myalgia 12/31/2016   Obstructive sleep apnea 03/31/2015   Presence of permanent cardiac pacemaker    SOB (shortness of  breath) 04/09/2016   Stroke (HCC)    TIAs   UTI (urinary tract infection)     Past Surgical History:  Procedure Laterality Date   ABDOMINAL HYSTERECTOMY     menorraghia, 2006, total   BIOPSY  09/01/2022   Procedure: BIOPSY;  Surgeon: Charlanne Groom, MD;  Location: [facility];  Service: Gastroenterology;;   CHOLECYSTECTOMY     COLONOSCOPY  2018    [facility] per patient   COLONOSCOPY WITH PROPOFOL  N/A 09/01/2022   Procedure: COLONOSCOPY WITH PROPOFOL ;  Surgeon: Charlanne Groom, MD;  Location: [facility];  Service: Gastroenterology;  Laterality: N/A;   ESOPHAGOGASTRODUODENOSCOPY  2018   [facility]    ESOPHAGOGASTRODUODENOSCOPY (EGD) WITH PROPOFOL  N/A 09/01/2022   Procedure: ESOPHAGOGASTRODUODENOSCOPY (EGD) WITH PROPOFOL ;  Surgeon: Charlanne Groom, MD;  Location: [facility];  Service: Gastroenterology;  Laterality: N/A;   IR IMAGING GUIDED PORT INSERTION  06/29/2023   KNEE SURGERY     right, repair torn torn cartialage   PACEMAKER INSERTION  08/2014   changed last in 2023 per [facility] at [facility]   TONSILLECTOMY     TUBAL LIGATION      Family History  Problem Relation Age of Onset   Diabetes Mother    Hypertension Mother    Heart disease Mother        s/p 1 stent   Hyperlipidemia Mother    Arthritis Mother    Colon cancer Father 2   Irritable bowel syndrome Sister    Hypertension Maternal Grandmother    Arthritis Maternal Grandmother    Heart disease Maternal Grandfather        MI   Hypertension Maternal Grandfather    Arthritis Maternal Grandfather    Hyperlipidemia Daughter    Hypertension Daughter    Hypertension Son    Esophageal cancer Neg Hx    Uterine cancer Neg Hx    Pancreatic cancer Neg Hx     Social History   Socioeconomic History   Marital status: Single    Spouse name: [name]   Number of children: 3   Years of education: [name]   Highest education level: High school graduate  Occupational History   [name]  Tobacco Use   Smoking status: Never    Passive exposure: Never   Smokeless tobacco: Never  Vaping Use   Vaping status: Never Used  Substance and Sexual Activity   Alcohol use: No   Drug use: No   Sexual activity: Not Currently    Partners: Male    Birth control/protection: Post-menopausal  Other Topics Concern   [name]  Social History Narrative    Lives at home alone   Retired   Caffeine: coffee   Social Drivers of Health   Financial Resource Strain: Medium Risk (07/08/2023)   Overall Financial Resource Strain (CARDIA)    Difficulty of Paying Living Expenses: Somewhat hard  Food Insecurity: Low Risk  (05/18/2024)   Received from [facility]   Hunger Vital Sign    Within the past 12 months, you worried that your food would run out before you got money to buy more: Never true    Within the past 12 months, the food you bought just didn't last and you didn't have money to get more. : Never true  Transportation Needs: No Transportation Needs (04/29/2024)   Received from [facility]    In the past 12 months, has lack of reliable transportation kept you from medical appointments, meetings,  work or from getting things needed for daily living? : No  Physical Activity: Inactive (07/08/2023)   Exercise Vital Sign    Days of Exercise per Week: 0 days    Minutes of Exercise per Session: 0 min  Stress: No Stress Concern Present (01/21/2023)   [employer] of Occupational Health - Occupational Stress Questionnaire    Feeling of Stress : Not at all  Social Connections: Moderately Isolated (07/08/2023)   Social Connection and Isolation Panel    Frequency of Communication with Friends and Family: More than three times a week    Frequency of Social Gatherings with Friends and Family: Twice a week    Attends Religious Services: More than 4 times per year    Active Member of [employer] or Organizations: No    Attends Banker Meetings: Never    Marital Status: Divorced  Catering manager Violence: Unknown (11/29/2023)   Humiliation, Afraid, Rape, and Kick questionnaire    Fear of Current or Ex-Partner: No    Emotionally Abused: No    Physically Abused: No    Sexually Abused: [name]    Outpatient Medications Prior to Visit  Medication Sig Dispense Refill   acetaminophen  (TYLENOL ) 500 MG tablet Take 500 mg by  mouth every 6 (six) hours as needed.     acyclovir  (ZOVIRAX ) 400 MG tablet TAKE ONE (1) TABLET BY MOUTH TWO (2) TIMES DAILY 60 tablet 3   albuterol  (PROVENTIL ) (2.5 MG/3ML) 0.083% nebulizer solution Take 3 mLs (2.5 mg total) by nebulization every 6 (six) hours as needed for wheezing or shortness of breath. 150 mL 2   albuterol  (VENTOLIN  HFA) 108 (90 Base) MCG/ACT inhaler Inhale 2 puffs into the lungs every 6 (six) hours as needed for wheezing or shortness of breath. 18 g 5   amLODipine  (NORVASC ) 5 MG tablet Take 5 mg by mouth daily. (Patient not taking: Reported on 06/13/2024)     amoxicillin  (AMOXIL ) 500 MG capsule Take 500 mg by mouth 3 (three) times daily.     aspirin  EC 81 MG tablet Take 81 mg by mouth daily. Swallow whole.     budesonide  (PULMICORT ) 0.5 MG/2ML nebulizer solution Take 2 mLs (0.5 mg total) by nebulization 2 (two) times daily as needed (cough, wheezing or shortness of breath). 120 mL 11   cetirizine  (ZYRTEC ) 10 MG tablet Take 10 mg by mouth at bedtime.     Cholecalciferol (VITAMIN D3) 50 MCG (2000 UT) TABS Take 2,000 Units by mouth in the morning.     cholestyramine  light (PREVALITE ) 4 g packet Take 4 g by mouth 2 (two) times daily.     cyclobenzaprine  (FLEXERIL ) 5 MG tablet Take 1 tablet (5 mg total) by mouth 2 (two) times daily as needed for up to 10 doses for muscle spasms. 10 tablet 0   dicyclomine  (BENTYL ) 20 MG tablet Take 1 tablet (20 mg total) by mouth 3 (three) times daily before meals. 90 tablet 2   diphenhydrAMINE  (BENADRYL ) 25 MG tablet Take 25 mg by mouth.     estradiol  (ESTRACE ) 0.1 MG/GM vaginal cream Place 0.5 g vaginally 2 (two) times a week. Place 0.5g nightly for two weeks then twice a week after 30 g 3   famotidine  (PEPCID ) 20 MG tablet Take 1 tablet (20 mg total) by mouth at bedtime. (Patient taking differently: Take 40 mg by mouth at bedtime.) 30 tablet 2   ferrous sulfate (FEROSUL) 325 (65 FE) MG tablet Take 1 tablet (325 mg total) by mouth  daily with  breakfast. 90 tablet 0   fluconazole  (DIFLUCAN ) 150 MG tablet Take by mouth.     fluticasone  (FLONASE ) 50 MCG/ACT nasal spray Place 2 sprays into both nostrils daily. 16 g 0   furosemide  (LASIX ) 20 MG tablet Take 20 mg by mouth daily as needed (fluid retention (feet swelling)).     hydrOXYzine  (ATARAX ) 10 MG tablet TAKE ONE (1) TABLET BY MOUTH 3 TIMES DAILY AS NEEDED FOR ITCHING 30 tablet 2   lidocaine  (XYLOCAINE ) 5 % ointment Apply 1 Application topically as needed. 35.44 g 0   lidocaine -prilocaine  (EMLA ) cream Apply 1 Application topically as needed. 30 g 0   LOMOTIL  2.5-0.025 MG tablet Take 1 tablet by mouth 2 (two) times daily. 60 tablet 6   loperamide  (IMODIUM ) 2 MG capsule Take 4 mg by mouth in the morning and at bedtime.     losartan  (COZAAR ) 50 MG tablet Take 50 mg by mouth in the morning and at bedtime.     Magnesium  Glycinate 100 MG CAPS Take 200 mg by mouth at bedtime.     meclizine  (ANTIVERT ) 12.5 MG tablet Take 12.5 mg by mouth 2 (two) times daily as needed for dizziness or nausea.     metoprolol  succinate (TOPROL -XL) 50 MG 24 hr tablet Take 1 tablet (50 mg total) by mouth 2 (two) times daily. 60 tablet 2   metroNIDAZOLE  (METROGEL ) 0.75 % gel Insert One applicatorful (5 g ) vaginally at bedtime for 5 days 45 g 0   montelukast  (SINGULAIR ) 10 MG tablet Take 1 tablet (10 mg total) by mouth at bedtime. Please start taking this on September 4. 30 tablet 3   Multiple Vitamins-Minerals (CENTRUM SILVER  WOMEN 50+ PO) Take 1 tablet by mouth daily at 6 (six) AM.     neomycin -polymyxin-hydrocortisone (CORTISPORIN) OTIC solution Place 4 drops into both ears 4 (four) times daily. 10 mL 4   ondansetron  (ZOFRAN ) 8 MG tablet Take 1 tablet (8 mg total) by mouth every 8 (eight) hours as needed for nausea or vomiting. 30 tablet 1   oxyCODONE -acetaminophen  (PERCOCET) 5-325 MG tablet Take 2 tablets by mouth every 6 (six) hours as needed. 10 tablet 0   pantoprazole  (PROTONIX ) 40 MG tablet Take 1 tablet (40  mg total) by mouth daily. (Patient not taking: Reported on 06/13/2024) 30 tablet 3   phenazopyridine  (PYRIDIUM ) 200 MG tablet Take 1 tablet (200 mg total) by mouth 3 (three) times daily as needed for pain. 30 tablet 0   potassium chloride  SA (KLOR-CON  M) 20 MEQ tablet TAKE 1 TABLET BY MOUTH EVERY MONDAY, WEDNESDAY, AND FRIDAY. 30 tablet 5   prochlorperazine  (COMPAZINE ) 10 MG tablet Take 0.5 tablets (5 mg total) by mouth 2 (two) times daily as needed for nausea or vomiting. 60 tablet 1   Respiratory Therapy Supplies (PILLOW MASK/ADULT) MISC      scopolamine  (TRANSDERM-SCOP) 1 MG/3DAYS Place 1 patch (1.5 mg total) onto the skin every 3 (three) days. 10 patch 1   sertraline  (ZOLOFT ) 50 MG tablet Take 1 tablet (50 mg total) by mouth daily. (Patient not taking: Reported on 06/13/2024) 30 tablet 3   spironolactone  (ALDACTONE ) 25 MG tablet Take 25 mg by mouth daily.     sucralfate  (CARAFATE ) 1 GM/10ML suspension Take 10 mLs (1 g total) by mouth 4 (four) times daily -  with meals and at bedtime. 473 mL 1   sulfamethoxazole -trimethoprim  (BACTRIM  DS) 800-160 MG tablet Take 1 tablet by mouth 2 (two) times daily.     Vibegron  (GEMTESA )  75 MG TABS Take 1 tablet (75 mg total) by mouth daily. 30 tablet 2   Facility-Administered Medications Prior to Visit  Medication Dose Route Frequency Provider Last Rate Last Admin   acetaminophen  (TYLENOL ) tablet 650 mg  650 mg Oral Once Ennever, Peter R, MD       nitroGLYCERIN  (NITROSTAT ) SL tablet 0.4 mg  0.4 mg Sublingual Once Blyth, Stacey A, MD       sodium chloride  flush (NS) 0.9 % injection 10 mL  10 mL Intracatheter PRN Timmy Maude SAUNDERS, MD   10 mL at 09/24/23 1640    Allergies  Allergen Reactions   Benazepril  Anaphylaxis, Swelling and Hives    angioedema Throat and lip swelling   Ondansetron  Hcl Hives    Redness and hives post IV admin on 07/05/17   Codeine Nausea And Vomiting   Morphine Hives    Redness and hives noted post IV admin on 07/05/17    ROS See  HPI    Objective:    Physical Exam  General: No acute distress. Awake and conversant.  Eyes: Normal conjunctiva, anicteric. Round symmetric pupils.  Respiratory: CTAB. Respirations are non-labored. No wheezing.  Skin: Warm. No rashes or ulcers.  Psych: Alert and oriented. Cooperative, Appropriate mood and affect, Normal judgment.  CV: RRR. No murmur. No lower extremity edema.  MSK: Normal ambulation. No clubbing or cyanosis.    There were no vitals taken for this visit. Wt Readings from Last 3 Encounters:  06/13/24 191 lb (86.6 kg)  06/04/24 192 lb 0.3 oz (87.1 kg)  06/02/24 192 lb (87.1 kg)       Assessment & Plan:   Problem List Items Addressed This Visit   None    I am having Tami [name] maintain her fluticasone , furosemide , losartan , cetirizine , Vitamin D3, Magnesium  Glycinate, meclizine , metoprolol  succinate, aspirin  EC, loperamide , spironolactone , hydrOXYzine , cholestyramine  light, sertraline , lidocaine -prilocaine , scopolamine , montelukast , Multiple Vitamins-Minerals (CENTRUM SILVER  WOMEN 50+ PO), ondansetron , acetaminophen , oxyCODONE -acetaminophen , pantoprazole , famotidine , amLODipine , Pillow Mask/Adult, potassium chloride  SA, prochlorperazine , albuterol , albuterol , budesonide , cyclobenzaprine , ferrous sulfate, estradiol , Gemtesa , phenazopyridine , lidocaine , acyclovir , metroNIDAZOLE , diphenhydrAMINE , sulfamethoxazole -trimethoprim , fluconazole , sucralfate , neomycin -polymyxin-hydrocortisone, Lomotil , amoxicillin , and dicyclomine . We will continue to administer nitroGLYCERIN .  No orders of the defined types were placed in this encounter.

## 2024-06-13 NOTE — Progress Notes (Signed)
 Chief Complaint:epigastric pain Primary GI Doctor: Dr. 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 history of small bowel obstruction secondary to adhesions, hysterectomy, cholecystectomy. Patient last seen in GI office on 05/19/2023 by Dr. Charlanne for abdominal pain, nausea, vomiting and diarrhea.  Lydiah has previously been seen here for abdominal pain , nausea, vomiting and diarrhea. She has had an extensive GI workup including multiple CT scans, CT enterography , abdominal ultrasound , upper and lower endoscopies with biopsies, stool studies, gastric emptying study.   Patient seen by [facility] 12/20/2023 for epigastric pain, chronic diarrhea, and nausea.  At that time mesenteric duplex ultrasound ordered and patient started on nortriptyline  25 mg at bedtime.  Patient transferred care back to our office.  Patient seen by oncology Dr. 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 She has not received any treatment in 5-6 months.   06/08/24 seen in ED for multiple complaints including back pain, epigastric abdominal pain, nausea, vomiting, diarrhea. Labs: Lipase 29, platelets 132, hemoglobin 13.1, WBC 5.8, calcium 7.5, albumin 2.8, BUN 19, creatinine 0.66. recent CT scan done and unremarkable, no repeat.  CT T-spine was obtained that shows some mild dextroscoliosis but is otherwise unremarkable. No lytic lesions. Patient given antiemetics and fluid resuscitation and on reevaluation her symptoms have significant proved.    Interval History    Patient presents for evaluation of chronic diarrhea, epigastric abdominal pain, and nausea with vomiting. She is poor historian, unable to recall prior procedures or therapies she has tried or currently on.  Her caregiver/POA [name] is her cousin who lives in [place]  and is on telephone and expresses concerns with patients nausea, vomiting, and epigastric pain. She is enquiring about another endoscopy. She reports the patient can not keep anything down. Patient has constant nausea and vomiting. She is taking compazine  prn. She tells me she takes it every other day. She reports after eating 1-2 bites her stomach hurts and regurgitates food back up. Patient believes she is taking both the pantoprazole  and Pepcid .  She also reports she thinks she is take taking sucralfate . Denies dysphagia.  She reports poor appetite. She only eats once a day most days. She has to force herself to eat. Weight stable per weight log.    She also has continue with chronic diarrhea. She typically has 4-5 semi formed stools per day. She is taking prn lomotil  and bentyl  20 mg twice daily. She reports the diarrhea causes weakness and she has no energy to do anything.   She was prescribed nortriptyline  by former GI in February she cannot recall if she ever took it?  She recently was placed on Amoxicillin  for ear infection, she has almost completed the medication.   She has prescription for Ibuprofen  800 mg prn for pain, but states she only takes prn. She has not taken in a few months. She takes OTC Tylenol  prn pain. She also takes Percocet 1 tab twice daily. She states this helps with both back and abdominal pain.   Previous GI Endoscopies / Labs / Imaging:    EGD/colonoscopy 12/13/23: Esophagus: The GE junction,diaphragm impression and top of gastric folds were ~ 42 cm from incisors. There was no evidence of esophagitis, Barrett's,varices,Schatzki ring or stricture.  Stomach:  Normal examination of the stomach. On retroflexion, hiatus hill grade was 1.  Duodenum: Normal examination of visualized portions of duodenum. We obtained duodenal biopsies to evaluate for celiac disease.  External and internal medium hemorrhoids Diverticula of  mild severity Otherwise, normal examination of cecum,ascending,transverse,descending,sigmoid colon and rectum. Terminal ileum was intubated and distal 10 cm appeared normal. We took random colon biopsies to evaluate for microscopic colitis. Final Diagnosis  A. TISSUE LABELED DUODENUM, BIOPSIES: SMALL BOWEL MUCOSA SHOWING NO SIGNIFICANT HISTOPATHOLOGIC CHANGES. THE FEATURES OF CELIAC SPRUE ARE NOT IDENTIFIED.  B. TISSUE LABELED RANDOM COLON BIOPSY, BIOPSIES: COLONIC MUCOSA SHOWING NO SIGNIFICANT HISTOPATHOLOGIC CHANGES.    EGD and colonoscopy October 2023 EGD -LA grade a reflux esophagitis.  Biopsied.  Normal stomach.  Biopsied.  Normal examined duodenum.  Biopsied. Colonoscopy  -Entire examined colon normal.  Biopsied.  Minimal sigmoid diverticulosis.  Examined portion of the ileum was normal.  Deep terminal ileal intubation was performed.  Biopsy.  Nonbleeding internal hemorrhoids.  Examination was otherwise normal..    Postprocedure she was given a trial of Bentyl .  Symptoms felt to be anxiety related   FINAL MICROSCOPIC DIAGNOSIS:  A. SMALL BOWEL, BIOPSY: - Benign small bowel mucosa with no significant pathologic changes  B. STOMACH, BIOPSY: - Gastric antral and oxyntic mucosa with no specific pathologic changes - Negative for H. pylori on HE stain - Negative for intestinal metaplasia or malignancy  C. ESOPHAGUS, DISTAL, BIOPSY: - Benign squamous mucosa with reflux changes - Gastric cardiac mucosa with no specific pathologic changes - Negative for intestinal metaplasia, dysplasia or malignancy  D. TERMINAL ILEUM, BIOPSY: - Benign small bowel mucosa with no significant pathologic changes  E. COLON, RANDOM, BIOPSY: - Benign colonic mucosa with no specific pathologic changes - Negative for increased intraepithelial lymphocytes or thickened subepithelial collagen table - Negative for dysplasia or malignancy    Imaging:    03/02/24 CTAP IMPRESSION: 1. No specific lytic  lesions characteristic of myeloma identified. 2. There is some formed stool in relatively small caliber distal colon. No dilated bowel. 3. Mild cardiomegaly. 4. 7 mm degenerative anterolisthesis at L4-5 with resulting mild bilateral foraminal stenosis at L4-5. 5. Moderate degenerative chondral thinning in both hips. 05/20/24 CT Angio Abd IMPRESSION: 1. No evidence of mesenteric ischemia. 2. No acute abnormality. 3. Borderline cardiomegaly.  12/27/2023 US  abdomen pelvic Doppler No evidence of any critical stenosis throughout celiac, hepatic, splenic, superior and inferior mesenteric arteries. Study does not exclude embolic or branch level disease.   CT CAP with contrast 12/05/23: 1. No acute abnormality identified within the chest. No acute inflammatory process identified within the abdomen or pelvis. 2. No metastatic disease identified within the chest, abdomen or pelvis. 3. Multiple other nonacute observations, as described above.  CTAP with contrast 11/05/23: No cause can be seen for the upper abdominal pain. No acute finding.   CTAP with contrast 10/18/23: 1. No acute abnormality. No evidence of bowel obstruction or acute  bowel inflammation. Normal appendix.  2. Minimal left colonic diverticulosis.  3. Finely irregular liver surface, cannot exclude cirrhosis. Suggest  correlation with liver function tests.  4. Mild cardiomegaly.   CT head wo contrast 09/22/23: No evidence of acute intracranial abnormality.   CTA chest 09/18/23: 1. No evidence of pulmonary embolism or acute cardiopulmonary disease. 2. Mild cardiomegaly.  CTAP with contrast 08/16/23: 1. No acute CT findings of the abdomen or pelvis to explain pain or diarrhea. 2. Status post cholecystectomy and hysterectomy. 3. Pelvic floor prolapse. 4. No evidence of  lymphadenopathy or metastatic disease in the abdomen or pelvis.  03/25/2023 US  abd complete 1. Evidence of hepatic steatosis. 2. Prior cholecystectomy  without evidence of biliary ductal dilatation. 3. Benign-appearing hepatic and bilateral renal cysts.    Wt Readings from Last 3 Encounters:  06/13/24 191 lb (86.6 kg)  06/04/24 192 lb 0.3 oz (87.1 kg)  06/02/24 192 lb (87.1 kg)     Past Medical History:  Diagnosis Date   Abnormal SPEP 07/26/2016   Arthritis    knees, hands   Back pain 07/14/2016   Bowel obstruction (HCC) 11/2014   C. 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   Multiple myeloma (HCC) 07/20/2018   Multiple myeloma not having achieved remission (HCC) 07/20/2018   Myalgia 12/31/2016   Obstructive sleep apnea 03/31/2015   Presence of permanent cardiac pacemaker    SOB (shortness of breath) 04/09/2016   Stroke (HCC)    TIAs   UTI (urinary tract infection)     Past Surgical History:  Procedure Laterality Date   ABDOMINAL HYSTERECTOMY     menorraghia, 2006, total   BIOPSY  09/01/2022   Procedure: BIOPSY;  Surgeon: Charlanne Groom, MD;  Location: [facility];  Service: Gastroenterology;;   CHOLECYSTECTOMY     COLONOSCOPY  2018   [facility] per patient   COLONOSCOPY WITH PROPOFOL  N/A 09/01/2022   Procedure: COLONOSCOPY WITH PROPOFOL ;  Surgeon: Charlanne Groom, MD;  Location: [facility];  Service: Gastroenterology;  Laterality: N/A;   ESOPHAGOGASTRODUODENOSCOPY  2018   [facility]    ESOPHAGOGASTRODUODENOSCOPY (EGD) WITH PROPOFOL  N/A 09/01/2022   Procedure: ESOPHAGOGASTRODUODENOSCOPY (EGD) WITH PROPOFOL ;  Surgeon: Charlanne Groom, MD;   Location: [facility];  Service: Gastroenterology;  Laterality: N/A;   IR IMAGING GUIDED PORT INSERTION  06/29/2023   KNEE SURGERY     right, repair torn torn cartialage   PACEMAKER INSERTION  08/2014   changed last in 2023 per pt at [facility]   TONSILLECTOMY     TUBAL LIGATION      Current Outpatient Medications  Medication Sig Dispense Refill   acetaminophen  (TYLENOL ) 500 MG tablet Take 500 mg by mouth every 6 (six) hours as needed.     acyclovir  (ZOVIRAX ) 400 MG tablet TAKE ONE (1) TABLET BY MOUTH TWO (2) TIMES DAILY 60 tablet 3   albuterol  (PROVENTIL ) (2.5 MG/3ML) 0.083% nebulizer solution Take 3 mLs (2.5 mg total) by nebulization every 6 (six) hours as needed for wheezing or shortness of breath. 150 mL 2   albuterol  (VENTOLIN  HFA) 108 (90 Base) MCG/ACT inhaler Inhale 2 puffs into the lungs every 6 (six) hours as needed for wheezing or shortness of breath. 18 g 5   amoxicillin  (AMOXIL ) 500 MG capsule Take 500 mg by mouth 3 (three) times daily.     aspirin  EC 81 MG tablet Take 81 mg by mouth daily. Swallow whole.     budesonide  (PULMICORT ) 0.5 MG/2ML nebulizer solution Take 2 mLs (0.5 mg total) by nebulization 2 (two) times daily as needed (cough, wheezing or  shortness of breath). 120 mL 11   cetirizine  (ZYRTEC ) 10 MG tablet Take 10 mg by mouth at bedtime.     Cholecalciferol (VITAMIN D3) 50 MCG (2000 UT) TABS Take 2,000 Units by mouth in the morning.     cholestyramine  light (PREVALITE ) 4 g packet Take 4 g by mouth 2 (two) times daily.     cyclobenzaprine  (FLEXERIL ) 5 MG tablet Take 1 tablet (5 mg total) by mouth 2 (two) times daily as needed for up to 10 doses for muscle spasms. 10 tablet 0   diphenhydrAMINE  (BENADRYL ) 25 MG tablet Take 25 mg by mouth.     estradiol  (ESTRACE ) 0.1 MG/GM vaginal cream Place 0.5 g vaginally 2 (two) times a week. Place 0.5g nightly for two weeks then twice a week after 30 g 3   famotidine  (PEPCID ) 20 MG tablet Take 1 tablet (20 mg total) by mouth at bedtime.  (Patient taking differently: Take 40 mg by mouth at bedtime.) 30 tablet 2   ferrous sulfate (FEROSUL) 325 (65 FE) MG tablet Take 1 tablet (325 mg total) by mouth daily with breakfast. 90 tablet 0   fluticasone  (FLONASE ) 50 MCG/ACT nasal spray Place 2 sprays into both nostrils daily. 16 g 0   furosemide  (LASIX ) 20 MG tablet Take 20 mg by mouth daily as needed (fluid retention (feet swelling)).     lidocaine  (XYLOCAINE ) 5 % ointment Apply 1 Application topically as needed. 35.44 g 0   lidocaine -prilocaine  (EMLA ) cream Apply 1 Application topically as needed. 30 g 0   LOMOTIL  2.5-0.025 MG tablet Take 1 tablet by mouth 2 (two) times daily. 60 tablet 6   loperamide  (IMODIUM ) 2 MG capsule Take 4 mg by mouth in the morning and at bedtime.     losartan  (COZAAR ) 50 MG tablet Take 50 mg by mouth in the morning and at bedtime.     Magnesium  Glycinate 100 MG CAPS Take 200 mg by mouth at bedtime.     meclizine  (ANTIVERT ) 12.5 MG tablet Take 12.5 mg by mouth 2 (two) times daily as needed for dizziness or nausea.     metoprolol  succinate (TOPROL -XL) 50 MG 24 hr tablet Take 1 tablet (50 mg total) by mouth 2 (two) times daily. 60 tablet 2   metroNIDAZOLE  (METROGEL ) 0.75 % gel Insert One applicatorful (5 g ) vaginally at bedtime for 5 days 45 g 0   montelukast  (SINGULAIR ) 10 MG tablet Take 1 tablet (10 mg total) by mouth at bedtime. Please start taking this on September 4. 30 tablet 3   Multiple Vitamins-Minerals (CENTRUM SILVER  WOMEN 50+ PO) Take 1 tablet by mouth daily at 6 (six) AM.     neomycin -polymyxin-hydrocortisone (CORTISPORIN) OTIC solution Place 4 drops into both ears 4 (four) times daily. 10 mL 4   ondansetron  (ZOFRAN ) 8 MG tablet Take 1 tablet (8 mg total) by mouth every 8 (eight) hours as needed for nausea or vomiting. 30 tablet 1   oxyCODONE -acetaminophen  (PERCOCET) 5-325 MG tablet Take 2 tablets by mouth every 6 (six) hours as needed. 10 tablet 0   phenazopyridine  (PYRIDIUM ) 200 MG tablet Take 1  tablet (200 mg total) by mouth 3 (three) times daily as needed for pain. 30 tablet 0   potassium chloride  SA (KLOR-CON  M) 20 MEQ tablet TAKE 1 TABLET BY MOUTH EVERY MONDAY, WEDNESDAY, AND FRIDAY. 30 tablet 5   prochlorperazine  (COMPAZINE ) 10 MG tablet Take 0.5 tablets (5 mg total) by mouth 2 (two) times daily as needed for nausea or vomiting. 60 tablet  1   Respiratory Therapy Supplies (PILLOW MASK/ADULT) MISC      scopolamine  (TRANSDERM-SCOP) 1 MG/3DAYS Place 1 patch (1.5 mg total) onto the skin every 3 (three) days. 10 patch 1   spironolactone  (ALDACTONE ) 25 MG tablet Take 25 mg by mouth daily.     sucralfate  (CARAFATE ) 1 GM/10ML suspension Take 10 mLs (1 g total) by mouth 4 (four) times daily -  with meals and at bedtime. 473 mL 1   Vibegron  (GEMTESA ) 75 MG TABS Take 1 tablet (75 mg total) by mouth daily. 30 tablet 2   amLODipine  (NORVASC ) 5 MG tablet Take 5 mg by mouth daily. (Patient not taking: Reported on 06/13/2024)     dicyclomine  (BENTYL ) 20 MG tablet Take 1 tablet (20 mg total) by mouth 3 (three) times daily before meals. 90 tablet 2   fluconazole  (DIFLUCAN ) 150 MG tablet Take by mouth.     hydrOXYzine  (ATARAX ) 10 MG tablet TAKE ONE (1) TABLET BY MOUTH 3 TIMES DAILY AS NEEDED FOR ITCHING 30 tablet 2   pantoprazole  (PROTONIX ) 40 MG tablet Take 1 tablet (40 mg total) by mouth daily. (Patient not taking: Reported on 06/13/2024) 30 tablet 3   sertraline  (ZOLOFT ) 50 MG tablet Take 1 tablet (50 mg total) by mouth daily. (Patient not taking: Reported on 06/13/2024) 30 tablet 3   sulfamethoxazole -trimethoprim  (BACTRIM  DS) 800-160 MG tablet Take 1 tablet by mouth 2 (two) times daily.     Current Facility-Administered Medications  Medication Dose Route Frequency Provider Last Rate Last Admin   nitroGLYCERIN  (NITROSTAT ) SL tablet 0.4 mg  0.4 mg Sublingual Once Blyth, Stacey A, MD       Facility-Administered Medications Ordered in Other Visits  Medication Dose Route Frequency Provider Last Rate Last  Admin   acetaminophen  (TYLENOL ) tablet 650 mg  650 mg Oral Once Ennever, Peter R, MD       sodium chloride  flush (NS) 0.9 % injection 10 mL  10 mL Intracatheter PRN Timmy Maude SAUNDERS, MD   10 mL at 09/24/23 1640    Allergies as of 06/13/2024 - Review Complete 06/13/2024  Allergen Reaction Noted   Benazepril  Anaphylaxis, Swelling, and Hives 09/15/2016   Ondansetron  hcl Hives 07/05/2017   Codeine Nausea And Vomiting 02/21/2015   Morphine Hives 07/05/2017    Family History  Problem Relation Age of Onset   Diabetes Mother    Hypertension Mother    Heart disease Mother        s/p 1 stent   Hyperlipidemia Mother    Arthritis Mother    Colon cancer Father 32   Irritable bowel syndrome Sister    Hypertension Maternal Grandmother    Arthritis Maternal Grandmother    Heart disease Maternal Grandfather        MI   Hypertension Maternal Grandfather    Arthritis Maternal Grandfather    Hyperlipidemia Daughter    Hypertension Daughter    Hypertension Son    Esophageal cancer Neg Hx    Uterine cancer Neg Hx    Pancreatic cancer Neg Hx     Review of Systems:    Constitutional: No weight loss, fever, chills, weakness or fatigue HEENT: Eyes: No change in vision               Ears, Nose, Throat:  No change in hearing or congestion Skin: No rash or itching Cardiovascular: No chest pain, chest pressure or palpitations   Respiratory: No SOB or cough Gastrointestinal: See HPI and otherwise negative Genitourinary: No dysuria or  change in urinary frequency Neurological: No headache, dizziness or syncope Musculoskeletal: No new muscle or joint pain Hematologic: No bleeding or bruising Psychiatric: No history of depression or anxiety    Physical Exam:  Vital signs: BP 108/80 (BP Location: Left Arm, Patient Position: Sitting, Cuff Size: Large)   Pulse (!) 52   Ht 5' 4.57 (1.64 m)   Wt 191 lb (86.6 kg)   BMI 32.21 kg/m   Constitutional:   Pleasant female appears to be in NAD, Well  developed, Well nourished, alert and cooperative Throat: Oral cavity and pharynx without inflammation, swelling or lesion.  Respiratory: Respirations even and unlabored. Lungs clear to auscultation bilaterally.   No wheezes, crackles, or rhonchi.  Cardiovascular: Normal S1, S2. Regular rate and rhythm. No peripheral edema, cyanosis or pallor.  Gastrointestinal:  Soft, nondistended, epigastric tenderness with palpation. No rebound or guarding. Normal bowel sounds. No appreciable masses or hepatomegaly. Rectal:  Not performed.  Msk:  Symmetrical without gross deformities. Without edema, no deformity or joint abnormality.  Neurologic:  Alert and  oriented x4;  grossly normal neurologically.  Skin:   Dry and intact without significant lesions or rashes.  RELEVANT LABS AND IMAGING: CBC    Latest Ref Rng & Units 06/08/2024    4:15 PM 06/04/2024   10:31 AM 06/02/2024    8:32 AM  CBC  WBC 4.0 - 10.5 K/uL 5.8  6.1  9.2   Hemoglobin 12.0 - 15.0 g/dL 86.8  86.5  87.9   Hematocrit 36.0 - 46.0 % 42.1  41.3  36.1   Platelets 150 - 400 K/uL 132  163  141      CMP     Latest Ref Rng & Units 06/08/2024    4:15 PM 06/04/2024   10:31 AM 06/02/2024    8:32 AM  CMP  Glucose 70 - 99 mg/dL 92  97  819   BUN 8 - 23 mg/dL 19  20  26    Creatinine 0.44 - 1.00 mg/dL 9.33  9.30  9.26   Sodium 135 - 145 mmol/L 142  142  140   Potassium 3.5 - 5.1 mmol/L 4.5  4.0  4.1   Chloride 98 - 111 mmol/L 115  105  106   CO2 22 - 32 mmol/L 20  25  21    Calcium 8.9 - 10.3 mg/dL 7.5  9.8  9.9   Total Protein 6.5 - 8.1 g/dL 5.5   7.0   Total Bilirubin 0.0 - 1.2 mg/dL 1.0   0.4   Alkaline Phos 38 - 126 U/L 32   50   AST 15 - 41 U/L 20   23   ALT 0 - 44 U/L 18   24      Lab Results  Component Value Date   TSH 1.45 05/18/2024  04/25/24 negative Cdiff   Assessment:   68 year old female patient that presents with chronic epigastric abdominal pain, nausea and vomiting, and diarrhea.  Patient has had multiple imaging in  the last 6 months that was unremarkable.  Patient had EGD/colon in February 2025 with biopsy that was unremarkable.  Patient has also had 2 C. difficile test this year that were negative.  It is difficult to get a clear picture of what patient is actually taking as well as what has worked for her.  We discussed taking the nausea more scheduled to reduce the vomiting.  Patient is requesting repeat upper GI endoscopy will defer to Dr. Charlanne.  Her platelet count is  132.  Patient will continue dicyclomine  for abdominal cramping and diarrhea and Lomotil .  Patient will continue pantoprazole  and Pepcid  for GERD.  Patient has chronic back issues with recent CT thoracic spine that showed Mild to moderate dextroscoliosis. No apparent acute process.    Plan: -continue dicyclomine  20 mg TID, refill -continue compazine  po daily , take scheduled  -continue pantoprazole  40 mg po daily -continue pepcid  20mg  po daily -Defer procedures to Dr. Charlanne  Thank you for the courtesy of this consult. Please call me with any questions or concerns.   Teddie Mehta, FNP-C [facility] 06/13/2024, 4:55 PM  Cc: O'Sullivan, Melissa, NP

## 2024-06-13 NOTE — H&P (View-Only) (Signed)
 Chief Complaint:epigastric pain Primary GI Doctor: Dr. 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 history of small bowel obstruction secondary to adhesions, hysterectomy, cholecystectomy. Patient last seen in GI office on 05/19/2023 by Dr. Charlanne for abdominal pain, nausea, vomiting and diarrhea.  Tami [name] has previously been seen here for abdominal pain , nausea, vomiting and diarrhea. She has had an extensive GI workup including multiple CT scans, CT enterography , abdominal ultrasound , upper and lower endoscopies with biopsies, stool studies, gastric emptying study.   Patient seen by [facility] 12/20/2023 for epigastric pain, chronic diarrhea, and nausea.  At that time mesenteric duplex ultrasound ordered and patient started on nortriptyline  25 mg at bedtime.  Patient transferred care back to our office.  Patient seen by oncology Dr. 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 She has not received any treatment in 5-6 months.   06/08/24 seen in ED for multiple complaints including back pain, epigastric abdominal pain, nausea, vomiting, diarrhea. Labs: Lipase 29, platelets 132, hemoglobin 13.1, WBC 5.8, calcium 7.5, albumin 2.8, BUN 19, creatinine 0.66. recent CT scan done and unremarkable, no repeat.  CT T-spine was obtained that shows some mild dextroscoliosis but is otherwise unremarkable. No lytic lesions. Patient given antiemetics and fluid resuscitation and on reevaluation her symptoms have significant proved.    Interval History    Patient presents for evaluation of chronic diarrhea, epigastric abdominal pain, and nausea with vomiting. She is poor historian, unable to recall prior procedures or therapies she has tried or currently on.  Her caregiver/POA Tami [name] is her cousin who lives in [place]  and is on telephone and expresses concerns with patients nausea, vomiting, and epigastric pain. She is enquiring about another endoscopy. She reports the patient can not keep anything down. Patient has constant nausea and vomiting. She is taking compazine  prn. She tells me she takes it every other day. She reports after eating 1-2 bites her stomach hurts and regurgitates food back up. Patient believes she is taking both the pantoprazole  and Pepcid .  She also reports she thinks she is take taking sucralfate . Denies dysphagia.  She reports poor appetite. She only eats once a day most days. She has to force herself to eat. Weight stable per weight log.    She also has continue with chronic diarrhea. She typically has 4-5 semi formed stools per day. She is taking prn lomotil  and bentyl  20 mg twice daily. She reports the diarrhea causes weakness and she has no energy to do anything.   She was prescribed nortriptyline  by former GI in February she cannot recall if she ever took it?  She recently was placed on Amoxicillin  for ear infection, she has almost completed the medication.   She has prescription for Ibuprofen  800 mg prn for pain, but states she only takes prn. She has not taken in a few months. She takes OTC Tylenol  prn pain. She also takes Percocet 1 tab twice daily. She states this helps with both back and abdominal pain.   Previous GI Endoscopies / Labs / Imaging:    EGD/colonoscopy 12/13/23: Esophagus: The GE junction,diaphragm impression and top of gastric folds were ~ 42 cm from incisors. There was no evidence of esophagitis, Barrett's,varices,Schatzki ring or stricture.  Stomach:  Normal examination of the stomach. On retroflexion, hiatus hill grade was 1.  Duodenum: Normal examination of visualized portions of duodenum. We obtained duodenal biopsies to evaluate for celiac disease.  External and internal medium hemorrhoids Diverticula of  mild severity Otherwise, normal examination of cecum,ascending,transverse,descending,sigmoid colon and rectum. Terminal ileum was intubated and distal 10 cm appeared normal. We took random colon biopsies to evaluate for microscopic colitis. Final Diagnosis  A. TISSUE LABELED DUODENUM, BIOPSIES: SMALL BOWEL MUCOSA SHOWING NO SIGNIFICANT HISTOPATHOLOGIC CHANGES. THE FEATURES OF CELIAC SPRUE ARE NOT IDENTIFIED.  B. TISSUE LABELED RANDOM COLON BIOPSY, BIOPSIES: COLONIC MUCOSA SHOWING NO SIGNIFICANT HISTOPATHOLOGIC CHANGES.    EGD and colonoscopy October 2023 EGD -LA grade a reflux esophagitis.  Biopsied.  Normal stomach.  Biopsied.  Normal examined duodenum.  Biopsied. Colonoscopy  -Entire examined colon normal.  Biopsied.  Minimal sigmoid diverticulosis.  Examined portion of the ileum was normal.  Deep terminal ileal intubation was performed.  Biopsy.  Nonbleeding internal hemorrhoids.  Examination was otherwise normal..    Postprocedure she was given a trial of Bentyl .  Symptoms felt to be anxiety related   FINAL MICROSCOPIC DIAGNOSIS:  A. SMALL BOWEL, BIOPSY: - Benign small bowel mucosa with no significant pathologic changes  B. STOMACH, BIOPSY: - Gastric antral and oxyntic mucosa with no specific pathologic changes - Negative for H. pylori on HE stain - Negative for intestinal metaplasia or malignancy  C. ESOPHAGUS, DISTAL, BIOPSY: - Benign squamous mucosa with reflux changes - Gastric cardiac mucosa with no specific pathologic changes - Negative for intestinal metaplasia, dysplasia or malignancy  D. TERMINAL ILEUM, BIOPSY: - Benign small bowel mucosa with no significant pathologic changes  E. COLON, RANDOM, BIOPSY: - Benign colonic mucosa with no specific pathologic changes - Negative for increased intraepithelial lymphocytes or thickened subepithelial collagen table - Negative for dysplasia or malignancy    Imaging:    03/02/24 CTAP IMPRESSION: 1. No specific lytic  lesions characteristic of myeloma identified. 2. There is some formed stool in relatively small caliber distal colon. No dilated bowel. 3. Mild cardiomegaly. 4. 7 mm degenerative anterolisthesis at L4-5 with resulting mild bilateral foraminal stenosis at L4-5. 5. Moderate degenerative chondral thinning in both hips. 05/20/24 CT Angio Abd IMPRESSION: 1. No evidence of mesenteric ischemia. 2. No acute abnormality. 3. Borderline cardiomegaly.  12/27/2023 US  abdomen pelvic Doppler No evidence of any critical stenosis throughout celiac, hepatic, splenic, superior and inferior mesenteric arteries. Study does not exclude embolic or branch level disease.   CT CAP with contrast 12/05/23: 1. No acute abnormality identified within the chest. No acute inflammatory process identified within the abdomen or pelvis. 2. No metastatic disease identified within the chest, abdomen or pelvis. 3. Multiple other nonacute observations, as described above.  CTAP with contrast 11/05/23: No cause can be seen for the upper abdominal pain. No acute finding.   CTAP with contrast 10/18/23: 1. No acute abnormality. No evidence of bowel obstruction or acute  bowel inflammation. Normal appendix.  2. Minimal left colonic diverticulosis.  3. Finely irregular liver surface, cannot exclude cirrhosis. Suggest  correlation with liver function tests.  4. Mild cardiomegaly.   CT head wo contrast 09/22/23: No evidence of acute intracranial abnormality.   CTA chest 09/18/23: 1. No evidence of pulmonary embolism or acute cardiopulmonary disease. 2. Mild cardiomegaly.  CTAP with contrast 08/16/23: 1. No acute CT findings of the abdomen or pelvis to explain pain or diarrhea. 2. Status post cholecystectomy and hysterectomy. 3. Pelvic floor prolapse. 4. No evidence of  lymphadenopathy or metastatic disease in the abdomen or pelvis.  03/25/2023 US  abd complete 1. Evidence of hepatic steatosis. 2. Prior cholecystectomy  without evidence of biliary ductal dilatation. 3. Benign-appearing hepatic and bilateral renal cysts.    Wt Readings from Last 3 Encounters:  06/13/24 191 lb (86.6 kg)  06/04/24 192 lb 0.3 oz (87.1 kg)  06/02/24 192 lb (87.1 kg)     Past Medical History:  Diagnosis Date   Abnormal SPEP 07/26/2016   Arthritis    knees, hands   Back pain 07/14/2016   Bowel obstruction (HCC) 11/2014   C. 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   Multiple myeloma (HCC) 07/20/2018   Multiple myeloma not having achieved remission (HCC) 07/20/2018   Myalgia 12/31/2016   Obstructive sleep apnea 03/31/2015   Presence of permanent cardiac pacemaker    SOB (shortness of breath) 04/09/2016   Stroke (HCC)    TIAs   UTI (urinary tract infection)     Past Surgical History:  Procedure Laterality Date   ABDOMINAL HYSTERECTOMY     menorraghia, 2006, total   BIOPSY  09/01/2022   Procedure: BIOPSY;  Surgeon: Charlanne Groom, MD;  Location: [facility];  Service: Gastroenterology;;   CHOLECYSTECTOMY     COLONOSCOPY  2018   [facility] per patient   COLONOSCOPY WITH PROPOFOL  N/A 09/01/2022   Procedure: COLONOSCOPY WITH PROPOFOL ;  Surgeon: Charlanne Groom, MD;  Location: [facility];  Service: Gastroenterology;  Laterality: N/A;   ESOPHAGOGASTRODUODENOSCOPY  2018   [facility]    ESOPHAGOGASTRODUODENOSCOPY (EGD) WITH PROPOFOL  N/A 09/01/2022   Procedure: ESOPHAGOGASTRODUODENOSCOPY (EGD) WITH PROPOFOL ;  Surgeon: Charlanne Groom, MD;   Location: [facility];  Service: Gastroenterology;  Laterality: N/A;   IR IMAGING GUIDED PORT INSERTION  06/29/2023   KNEE SURGERY     right, repair torn torn cartialage   PACEMAKER INSERTION  08/2014   changed last in 2023 per pt at [facility]   TONSILLECTOMY     TUBAL LIGATION      Current Outpatient Medications  Medication Sig Dispense Refill   acetaminophen  (TYLENOL ) 500 MG tablet Take 500 mg by mouth every 6 (six) hours as needed.     acyclovir  (ZOVIRAX ) 400 MG tablet TAKE ONE (1) TABLET BY MOUTH TWO (2) TIMES DAILY 60 tablet 3   albuterol  (PROVENTIL ) (2.5 MG/3ML) 0.083% nebulizer solution Take 3 mLs (2.5 mg total) by nebulization every 6 (six) hours as needed for wheezing or shortness of breath. 150 mL 2   albuterol  (VENTOLIN  HFA) 108 (90 Base) MCG/ACT inhaler Inhale 2 puffs into the lungs every 6 (six) hours as needed for wheezing or shortness of breath. 18 g 5   amoxicillin  (AMOXIL ) 500 MG capsule Take 500 mg by mouth 3 (three) times daily.     aspirin  EC 81 MG tablet Take 81 mg by mouth daily. Swallow whole.     budesonide  (PULMICORT ) 0.5 MG/2ML nebulizer solution Take 2 mLs (0.5 mg total) by nebulization 2 (two) times daily as needed (cough, wheezing or  shortness of breath). 120 mL 11   cetirizine  (ZYRTEC ) 10 MG tablet Take 10 mg by mouth at bedtime.     Cholecalciferol (VITAMIN D3) 50 MCG (2000 UT) TABS Take 2,000 Units by mouth in the morning.     cholestyramine  light (PREVALITE ) 4 g packet Take 4 g by mouth 2 (two) times daily.     cyclobenzaprine  (FLEXERIL ) 5 MG tablet Take 1 tablet (5 mg total) by mouth 2 (two) times daily as needed for up to 10 doses for muscle spasms. 10 tablet 0   diphenhydrAMINE  (BENADRYL ) 25 MG tablet Take 25 mg by mouth.     estradiol  (ESTRACE ) 0.1 MG/GM vaginal cream Place 0.5 g vaginally 2 (two) times a week. Place 0.5g nightly for two weeks then twice a week after 30 g 3   famotidine  (PEPCID ) 20 MG tablet Take 1 tablet (20 mg total) by mouth at bedtime.  (Patient taking differently: Take 40 mg by mouth at bedtime.) 30 tablet 2   ferrous sulfate (FEROSUL) 325 (65 FE) MG tablet Take 1 tablet (325 mg total) by mouth daily with breakfast. 90 tablet 0   fluticasone  (FLONASE ) 50 MCG/ACT nasal spray Place 2 sprays into both nostrils daily. 16 g 0   furosemide  (LASIX ) 20 MG tablet Take 20 mg by mouth daily as needed (fluid retention (feet swelling)).     lidocaine  (XYLOCAINE ) 5 % ointment Apply 1 Application topically as needed. 35.44 g 0   lidocaine -prilocaine  (EMLA ) cream Apply 1 Application topically as needed. 30 g 0   LOMOTIL  2.5-0.025 MG tablet Take 1 tablet by mouth 2 (two) times daily. 60 tablet 6   loperamide  (IMODIUM ) 2 MG capsule Take 4 mg by mouth in the morning and at bedtime.     losartan  (COZAAR ) 50 MG tablet Take 50 mg by mouth in the morning and at bedtime.     Magnesium  Glycinate 100 MG CAPS Take 200 mg by mouth at bedtime.     meclizine  (ANTIVERT ) 12.5 MG tablet Take 12.5 mg by mouth 2 (two) times daily as needed for dizziness or nausea.     metoprolol  succinate (TOPROL -XL) 50 MG 24 hr tablet Take 1 tablet (50 mg total) by mouth 2 (two) times daily. 60 tablet 2   metroNIDAZOLE  (METROGEL ) 0.75 % gel Insert One applicatorful (5 g ) vaginally at bedtime for 5 days 45 g 0   montelukast  (SINGULAIR ) 10 MG tablet Take 1 tablet (10 mg total) by mouth at bedtime. Please start taking this on September 4. 30 tablet 3   Multiple Vitamins-Minerals (CENTRUM SILVER  WOMEN 50+ PO) Take 1 tablet by mouth daily at 6 (six) AM.     neomycin -polymyxin-hydrocortisone (CORTISPORIN) OTIC solution Place 4 drops into both ears 4 (four) times daily. 10 mL 4   ondansetron  (ZOFRAN ) 8 MG tablet Take 1 tablet (8 mg total) by mouth every 8 (eight) hours as needed for nausea or vomiting. 30 tablet 1   oxyCODONE -acetaminophen  (PERCOCET) 5-325 MG tablet Take 2 tablets by mouth every 6 (six) hours as needed. 10 tablet 0   phenazopyridine  (PYRIDIUM ) 200 MG tablet Take 1  tablet (200 mg total) by mouth 3 (three) times daily as needed for pain. 30 tablet 0   potassium chloride  SA (KLOR-CON  M) 20 MEQ tablet TAKE 1 TABLET BY MOUTH EVERY MONDAY, WEDNESDAY, AND FRIDAY. 30 tablet 5   prochlorperazine  (COMPAZINE ) 10 MG tablet Take 0.5 tablets (5 mg total) by mouth 2 (two) times daily as needed for nausea or vomiting. 60 tablet  1   Respiratory Therapy Supplies (PILLOW MASK/ADULT) MISC      scopolamine  (TRANSDERM-SCOP) 1 MG/3DAYS Place 1 patch (1.5 mg total) onto the skin every 3 (three) days. 10 patch 1   spironolactone  (ALDACTONE ) 25 MG tablet Take 25 mg by mouth daily.     sucralfate  (CARAFATE ) 1 GM/10ML suspension Take 10 mLs (1 g total) by mouth 4 (four) times daily -  with meals and at bedtime. 473 mL 1   Vibegron  (GEMTESA ) 75 MG TABS Take 1 tablet (75 mg total) by mouth daily. 30 tablet 2   amLODipine  (NORVASC ) 5 MG tablet Take 5 mg by mouth daily. (Patient not taking: Reported on 06/13/2024)     dicyclomine  (BENTYL ) 20 MG tablet Take 1 tablet (20 mg total) by mouth 3 (three) times daily before meals. 90 tablet 2   fluconazole  (DIFLUCAN ) 150 MG tablet Take by mouth.     hydrOXYzine  (ATARAX ) 10 MG tablet TAKE ONE (1) TABLET BY MOUTH 3 TIMES DAILY AS NEEDED FOR ITCHING 30 tablet 2   pantoprazole  (PROTONIX ) 40 MG tablet Take 1 tablet (40 mg total) by mouth daily. (Patient not taking: Reported on 06/13/2024) 30 tablet 3   sertraline  (ZOLOFT ) 50 MG tablet Take 1 tablet (50 mg total) by mouth daily. (Patient not taking: Reported on 06/13/2024) 30 tablet 3   sulfamethoxazole -trimethoprim  (BACTRIM  DS) 800-160 MG tablet Take 1 tablet by mouth 2 (two) times daily.     Current Facility-Administered Medications  Medication Dose Route Frequency Provider Last Rate Last Admin   nitroGLYCERIN  (NITROSTAT ) SL tablet 0.4 mg  0.4 mg Sublingual Once Blyth, Stacey A, MD       Facility-Administered Medications Ordered in Other Visits  Medication Dose Route Frequency Provider Last Rate Last  Admin   acetaminophen  (TYLENOL ) tablet 650 mg  650 mg Oral Once Ennever, Peter R, MD       sodium chloride  flush (NS) 0.9 % injection 10 mL  10 mL Intracatheter PRN Timmy Maude SAUNDERS, MD   10 mL at 09/24/23 1640    Allergies as of 06/13/2024 - Review Complete 06/13/2024  Allergen Reaction Noted   Benazepril  Anaphylaxis, Swelling, and Hives 09/15/2016   Ondansetron  hcl Hives 07/05/2017   Codeine Nausea And Vomiting 02/21/2015   Morphine Hives 07/05/2017    Family History  Problem Relation Age of Onset   Diabetes Mother    Hypertension Mother    Heart disease Mother        s/p 1 stent   Hyperlipidemia Mother    Arthritis Mother    Colon cancer Father 32   Irritable bowel syndrome Sister    Hypertension Maternal Grandmother    Arthritis Maternal Grandmother    Heart disease Maternal Grandfather        MI   Hypertension Maternal Grandfather    Arthritis Maternal Grandfather    Hyperlipidemia Daughter    Hypertension Daughter    Hypertension Son    Esophageal cancer Neg Hx    Uterine cancer Neg Hx    Pancreatic cancer Neg Hx     Review of Systems:    Constitutional: No weight loss, fever, chills, weakness or fatigue HEENT: Eyes: No change in vision               Ears, Nose, Throat:  No change in hearing or congestion Skin: No rash or itching Cardiovascular: No chest pain, chest pressure or palpitations   Respiratory: No SOB or cough Gastrointestinal: See HPI and otherwise negative Genitourinary: No dysuria or  change in urinary frequency Neurological: No headache, dizziness or syncope Musculoskeletal: No new muscle or joint pain Hematologic: No bleeding or bruising Psychiatric: No history of depression or anxiety    Physical Exam:  Vital signs: BP 108/80 (BP Location: Left Arm, Patient Position: Sitting, Cuff Size: Large)   Pulse (!) 52   Ht 5' 4.57 (1.64 m)   Wt 191 lb (86.6 kg)   BMI 32.21 kg/m   Constitutional:   Pleasant female appears to be in NAD, Well  developed, Well nourished, alert and cooperative Throat: Oral cavity and pharynx without inflammation, swelling or lesion.  Respiratory: Respirations even and unlabored. Lungs clear to auscultation bilaterally.   No wheezes, crackles, or rhonchi.  Cardiovascular: Normal S1, S2. Regular rate and rhythm. No peripheral edema, cyanosis or pallor.  Gastrointestinal:  Soft, nondistended, epigastric tenderness with palpation. No rebound or guarding. Normal bowel sounds. No appreciable masses or hepatomegaly. Rectal:  Not performed.  Msk:  Symmetrical without gross deformities. Without edema, no deformity or joint abnormality.  Neurologic:  Alert and  oriented x4;  grossly normal neurologically.  Skin:   Dry and intact without significant lesions or rashes.  RELEVANT LABS AND IMAGING: CBC    Latest Ref Rng & Units 06/08/2024    4:15 PM 06/04/2024   10:31 AM 06/02/2024    8:32 AM  CBC  WBC 4.0 - 10.5 K/uL 5.8  6.1  9.2   Hemoglobin 12.0 - 15.0 g/dL 86.8  86.5  87.9   Hematocrit 36.0 - 46.0 % 42.1  41.3  36.1   Platelets 150 - 400 K/uL 132  163  141      CMP     Latest Ref Rng & Units 06/08/2024    4:15 PM 06/04/2024   10:31 AM 06/02/2024    8:32 AM  CMP  Glucose 70 - 99 mg/dL 92  97  819   BUN 8 - 23 mg/dL 19  20  26    Creatinine 0.44 - 1.00 mg/dL 9.33  9.30  9.26   Sodium 135 - 145 mmol/L 142  142  140   Potassium 3.5 - 5.1 mmol/L 4.5  4.0  4.1   Chloride 98 - 111 mmol/L 115  105  106   CO2 22 - 32 mmol/L 20  25  21    Calcium 8.9 - 10.3 mg/dL 7.5  9.8  9.9   Total Protein 6.5 - 8.1 g/dL 5.5   7.0   Total Bilirubin 0.0 - 1.2 mg/dL 1.0   0.4   Alkaline Phos 38 - 126 U/L 32   50   AST 15 - 41 U/L 20   23   ALT 0 - 44 U/L 18   24      Lab Results  Component Value Date   TSH 1.45 05/18/2024  04/25/24 negative Cdiff   Assessment:   68 year old female patient that presents with chronic epigastric abdominal pain, nausea and vomiting, and diarrhea.  Patient has had multiple imaging in  the last 6 months that was unremarkable.  Patient had EGD/colon in February 2025 with biopsy that was unremarkable.  Patient has also had 2 C. difficile test this year that were negative.  It is difficult to get a clear picture of what patient is actually taking as well as what has worked for her.  We discussed taking the nausea more scheduled to reduce the vomiting.  Patient is requesting repeat upper GI endoscopy will defer to Dr. Charlanne.  Her platelet count is  132.  Patient will continue dicyclomine  for abdominal cramping and diarrhea and Lomotil .  Patient will continue pantoprazole  and Pepcid  for GERD.  Patient has chronic back issues with recent CT thoracic spine that showed Mild to moderate dextroscoliosis. No apparent acute process.    Plan: -continue dicyclomine  20 mg TID, refill -continue compazine  po daily , take scheduled  -continue pantoprazole  40 mg po daily -continue pepcid  20mg  po daily -Defer procedures to Dr. Charlanne  Thank you for the courtesy of this consult. Please call me with any questions or concerns.   Tami Mehta, FNP-C [facility] 06/13/2024, 4:55 PM  Cc: O'Sullivan, Melissa, NP

## 2024-06-15 ENCOUNTER — Ambulatory Visit: Admitting: Student

## 2024-06-15 ENCOUNTER — Other Ambulatory Visit: Payer: Self-pay

## 2024-06-15 DIAGNOSIS — R109 Unspecified abdominal pain: Secondary | ICD-10-CM

## 2024-06-15 DIAGNOSIS — N3946 Mixed incontinence: Secondary | ICD-10-CM

## 2024-06-15 MED ORDER — GEMTESA 75 MG PO TABS: 1.0000 | ORAL_TABLET | Freq: Every day | ORAL | 2 refills | Status: AC

## 2024-06-15 NOTE — Assessment & Plan Note (Deleted)
 Chronic.  Followed by GI.  Last seen by GI 06/13/2024 started on dicyclomine , (Bentyl ) 3 times daily. Recently went to the emergency department for abdominal pain-no acute findings; benign abdominal exam. Last OV per Dr. Malva -Carafate  prescribed, reports no improvement with this medication.  Continue to follow-up with GI.

## 2024-06-15 NOTE — Assessment & Plan Note (Deleted)
No recent exacerbation, follows with cardiology

## 2024-06-15 NOTE — Progress Notes (Unsigned)
 Subjective:     [name], female    DOB: October 06, 1956, 68 y.o.   MRN: [id number]  No chief complaint on file.   HPI Tami [name] is a 68yo F presents for follow up on chronic conditions, and recently seen in emergency room.   Followed by cardiology, urogyn, infectious disease, hematology/oncology, GI  Seen in ED on 06/08/2024 for abdominal pain. Per ED note: Patient evaluated for multiple symptoms with mild thoracic spine tenderness and a benign abdominal exam; labs and recent abdominal CT were unremarkable. CT T-spine showedmild dextroscoliosis, and symptoms improved with fluids and antiemetic  Chronic Diarrhea/ N/V/ Abdominal Pain- followed by GI Last seen by GI 06/13/24- Started on  Dicyclomine  (Bentyl ) 1 tablet three times daily.   Continue compazine  for nausea  Continue Pantoprazole  40 mg po daily  Continue Pepcid  20 mg po daily  She continues to experience intermittent diarrhea and has required fluid replacement. Bowel movements are now typically 3-4/ soft stools per day, though she reports increased over the past few days, denies recent antibiotics and stool incontinence. She reports taking Imodium  twice daily, and dycylomine (Bentyl ) 10 mg twice daily, and Pantoprazole  40 mg daily. Bowel movements usually occur after meals. +abdominal cramping. Denies loss of appetite and making efforts to maintain hydration and electrolyte balance.   Hypomagnesia She continues takes magnesium  glycinate 200 mg daily spilt in AM and HS per Heme/Onc. Pt is also receiving magnesium  IVPB infusions. Magnesium  levels have been stable.  Multiple Myeloma Patient has not had chemotherapy treatments since January per oncology.  Hypertension- followed by cardiology [facility] Amlodipine  5 mg daily Losartan  (Cozaar ) 50 mg twice daily Metoprolol  50 mg twice daily Spirolactone 25 mg daily  Compliant with medications  HCM Last DEXA 2022, due  Patient denies fever, chills, SOB, CP,  palpitations, dyspnea, edema, HA, vision changes, N/V, urinary symptoms, rash, weight changes,    BP Readings from Last 1 Encounters:  06/13/24 108/80    History of Present Illness              Health Maintenance Due  Topic Date Due   DEXA SCAN  11/07/2023   INFLUENZA VACCINE  06/09/2024   Medicare Annual Wellness (AWV)  07/07/2024    Past Medical History:  Diagnosis Date   Abnormal SPEP 07/26/2016   Arthritis    knees, hands   Back pain 07/14/2016   Bowel obstruction (HCC) 11/2014   C. 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   Multiple myeloma (HCC) 07/20/2018   Multiple myeloma not having achieved remission (HCC) 07/20/2018   Myalgia 12/31/2016   Obstructive sleep apnea 03/31/2015   Presence of permanent cardiac pacemaker    SOB (shortness of breath) 04/09/2016   Stroke (HCC)    TIAs   UTI (urinary tract infection)     Past Surgical History:  Procedure Laterality Date   ABDOMINAL HYSTERECTOMY     menorraghia, 2006, total   BIOPSY  09/01/2022   Procedure: BIOPSY;  Surgeon:  Charlanne Groom, MD;  Location: [facility];  Service: Gastroenterology;;   CHOLECYSTECTOMY     COLONOSCOPY  2018   [facility] per patient   COLONOSCOPY WITH PROPOFOL  N/A 09/01/2022   Procedure: COLONOSCOPY WITH PROPOFOL ;  Surgeon: Charlanne Groom, MD;  Location: [facility];  Service: Gastroenterology;  Laterality: N/A;   ESOPHAGOGASTRODUODENOSCOPY  2018   [facility]    ESOPHAGOGASTRODUODENOSCOPY (EGD) WITH  PROPOFOL  N/A 09/01/2022   Procedure: ESOPHAGOGASTRODUODENOSCOPY (EGD) WITH PROPOFOL ;  Surgeon: Charlanne Groom, MD;  Location: [facility];  Service: Gastroenterology;  Laterality: N/A;   IR IMAGING GUIDED PORT INSERTION  06/29/2023   KNEE SURGERY     right, repair torn torn cartialage   PACEMAKER INSERTION  08/2014   changed last in 2023 per pt at [facility]   TONSILLECTOMY     TUBAL LIGATION      Family History  Problem Relation Age of Onset   Diabetes Mother    Hypertension Mother    Heart disease Mother        s/p 1 stent   Hyperlipidemia Mother    Arthritis Mother    Colon cancer Father 65   Irritable bowel syndrome Sister    Hypertension Maternal Grandmother    Arthritis Maternal Grandmother    Heart disease Maternal Grandfather        MI   Hypertension Maternal Grandfather    Arthritis Maternal Grandfather    Hyperlipidemia Daughter    Hypertension Daughter    Hypertension Son    Esophageal cancer Neg Hx    Uterine cancer Neg Hx    Pancreatic cancer Neg Hx     Social History   Socioeconomic History   Marital status: Single    Spouse name: [name]   Number of children: 3   Years of education: [name]   Highest education level: High school graduate  Occupational History   [name]  Tobacco Use   Smoking status: Never    Passive exposure: Never   Smokeless tobacco: Never  Vaping Use   Vaping status: Never Used  Substance and Sexual Activity   Alcohol use: No   Drug use: No   Sexual activity: Not Currently    Partners: Male    Birth control/protection: Post-menopausal  Other Topics Concern   [name]  Social History Narrative   Lives at home alone   Retired   Caffeine: coffee   Social Drivers of Health   Financial Resource Strain: Medium Risk (07/08/2023)   Overall Financial Resource Strain (CARDIA)    Difficulty of Paying Living Expenses: Somewhat hard  Food Insecurity: Low Risk  (05/18/2024)   Received from [facility]   Hunger  Vital Sign    Within the past 12 months, you worried that your food would run out before you got money to buy more: Never true    Within the past 12 months, the food you bought just didn't last and you didn't have money to get more. : Never true  Transportation Needs: No Transportation Needs (04/29/2024)   Received from [facility]    In the past 12 months, has lack of reliable transportation kept you from medical appointments, meetings, work or from getting things needed for daily living? : No  Physical Activity: Inactive (07/08/2023)   Exercise Vital Sign    Days of Exercise per Week: 0 days    Minutes of Exercise per Session: 0 min  Stress: No Stress Concern Present (01/21/2023)  [employer] of Occupational Health - Occupational Stress Questionnaire    Feeling of Stress : Not at all  Social Connections: Moderately Isolated (07/08/2023)   Social Connection and Isolation Panel    Frequency of Communication with Friends and Family: More than three times a week    Frequency of Social Gatherings with Friends and Family: Twice a week    Attends Religious Services: More than 4 times per year    Active Member of [employer] or Organizations: No    Attends Banker Meetings: Never    Marital Status: Divorced  Catering manager Violence: Unknown (11/29/2023)   Humiliation, Afraid, Rape, and Kick questionnaire    Fear of Current or Ex-Partner: No    Emotionally Abused: No    Physically Abused: No    Sexually Abused: [name]    Outpatient Medications Prior to Visit  Medication Sig Dispense Refill   acetaminophen  (TYLENOL ) 500 MG tablet Take 500 mg by mouth every 6 (six) hours as needed.     acyclovir  (ZOVIRAX ) 400 MG tablet TAKE ONE (1) TABLET BY MOUTH TWO (2) TIMES DAILY 60 tablet 3   albuterol  (PROVENTIL ) (2.5 MG/3ML) 0.083% nebulizer solution Take 3 mLs (2.5 mg total) by nebulization every 6 (six) hours as needed for wheezing or shortness of breath. 150 mL 2    albuterol  (VENTOLIN  HFA) 108 (90 Base) MCG/ACT inhaler Inhale 2 puffs into the lungs every 6 (six) hours as needed for wheezing or shortness of breath. 18 g 5   amLODipine  (NORVASC ) 5 MG tablet Take 5 mg by mouth daily. (Patient not taking: Reported on 06/13/2024)     amoxicillin  (AMOXIL ) 500 MG capsule Take 500 mg by mouth 3 (three) times daily.     aspirin  EC 81 MG tablet Take 81 mg by mouth daily. Swallow whole.     budesonide  (PULMICORT ) 0.5 MG/2ML nebulizer solution Take 2 mLs (0.5 mg total) by nebulization 2 (two) times daily as needed (cough, wheezing or shortness of breath). 120 mL 11   cetirizine  (ZYRTEC ) 10 MG tablet Take 10 mg by mouth at bedtime.     Cholecalciferol (VITAMIN D3) 50 MCG (2000 UT) TABS Take 2,000 Units by mouth in the morning.     cholestyramine  light (PREVALITE ) 4 g packet Take 4 g by mouth 2 (two) times daily.     cyclobenzaprine  (FLEXERIL ) 5 MG tablet Take 1 tablet (5 mg total) by mouth 2 (two) times daily as needed for up to 10 doses for muscle spasms. 10 tablet 0   dicyclomine  (BENTYL ) 20 MG tablet Take 1 tablet (20 mg total) by mouth 3 (three) times daily before meals. 90 tablet 2   diphenhydrAMINE  (BENADRYL ) 25 MG tablet Take 25 mg by mouth.     estradiol  (ESTRACE ) 0.1 MG/GM vaginal cream Place 0.5 g vaginally 2 (two) times a week. Place 0.5g nightly for two weeks then twice a week after 30 g 3   famotidine  (PEPCID ) 20 MG tablet Take 1 tablet (20 mg total) by mouth at bedtime. (Patient taking differently: Take 40 mg by mouth at bedtime.) 30 tablet 2   ferrous sulfate (FEROSUL) 325 (65 FE) MG tablet Take 1 tablet (325 mg total) by mouth daily with breakfast. 90 tablet 0   fluconazole  (DIFLUCAN ) 150 MG tablet Take by mouth.     fluticasone  (FLONASE ) 50 MCG/ACT nasal spray Place 2 sprays into both nostrils daily. 16 g 0   furosemide  (LASIX ) 20 MG tablet Take 20 mg by mouth daily as  needed (fluid retention (feet swelling)).     hydrOXYzine  (ATARAX ) 10 MG tablet TAKE  ONE (1) TABLET BY MOUTH 3 TIMES DAILY AS NEEDED FOR ITCHING 30 tablet 2   lidocaine  (XYLOCAINE ) 5 % ointment Apply 1 Application topically as needed. 35.44 g 0   lidocaine -prilocaine  (EMLA ) cream Apply 1 Application topically as needed. 30 g 0   LOMOTIL  2.5-0.025 MG tablet Take 1 tablet by mouth 2 (two) times daily. 60 tablet 6   loperamide  (IMODIUM ) 2 MG capsule Take 4 mg by mouth in the morning and at bedtime.     losartan  (COZAAR ) 50 MG tablet Take 50 mg by mouth in the morning and at bedtime.     Magnesium  Glycinate 100 MG CAPS Take 200 mg by mouth at bedtime.     meclizine  (ANTIVERT ) 12.5 MG tablet Take 12.5 mg by mouth 2 (two) times daily as needed for dizziness or nausea.     metoprolol  succinate (TOPROL -XL) 50 MG 24 hr tablet Take 1 tablet (50 mg total) by mouth 2 (two) times daily. 60 tablet 2   metroNIDAZOLE  (METROGEL ) 0.75 % gel Insert One applicatorful (5 g ) vaginally at bedtime for 5 days 45 g 0   montelukast  (SINGULAIR ) 10 MG tablet Take 1 tablet (10 mg total) by mouth at bedtime. Please start taking this on September 4. 30 tablet 3   Multiple Vitamins-Minerals (CENTRUM SILVER  WOMEN 50+ PO) Take 1 tablet by mouth daily at 6 (six) AM.     neomycin -polymyxin-hydrocortisone (CORTISPORIN) OTIC solution Place 4 drops into both ears 4 (four) times daily. 10 mL 4   ondansetron  (ZOFRAN ) 8 MG tablet Take 1 tablet (8 mg total) by mouth every 8 (eight) hours as needed for nausea or vomiting. 30 tablet 1   oxyCODONE -acetaminophen  (PERCOCET) 5-325 MG tablet Take 2 tablets by mouth every 6 (six) hours as needed. 10 tablet 0   pantoprazole  (PROTONIX ) 40 MG tablet Take 1 tablet (40 mg total) by mouth daily. (Patient not taking: Reported on 06/13/2024) 30 tablet 3   phenazopyridine  (PYRIDIUM ) 200 MG tablet Take 1 tablet (200 mg total) by mouth 3 (three) times daily as needed for pain. 30 tablet 0   potassium chloride  SA (KLOR-CON  M) 20 MEQ tablet TAKE 1 TABLET BY MOUTH EVERY MONDAY, WEDNESDAY, AND  FRIDAY. 30 tablet 5   prochlorperazine  (COMPAZINE ) 10 MG tablet Take 0.5 tablets (5 mg total) by mouth 2 (two) times daily as needed for nausea or vomiting. 60 tablet 1   Respiratory Therapy Supplies (PILLOW MASK/ADULT) MISC      scopolamine  (TRANSDERM-SCOP) 1 MG/3DAYS Place 1 patch (1.5 mg total) onto the skin every 3 (three) days. 10 patch 1   sertraline  (ZOLOFT ) 50 MG tablet Take 1 tablet (50 mg total) by mouth daily. (Patient not taking: Reported on 06/13/2024) 30 tablet 3   spironolactone  (ALDACTONE ) 25 MG tablet Take 25 mg by mouth daily.     sucralfate  (CARAFATE ) 1 GM/10ML suspension Take 10 mLs (1 g total) by mouth 4 (four) times daily -  with meals and at bedtime. 473 mL 1   sulfamethoxazole -trimethoprim  (BACTRIM  DS) 800-160 MG tablet Take 1 tablet by mouth 2 (two) times daily.     Vibegron  (GEMTESA ) 75 MG TABS Take 1 tablet (75 mg total) by mouth daily. 30 tablet 2   Facility-Administered Medications Prior to Visit  Medication Dose Route Frequency Provider Last Rate Last Admin   acetaminophen  (TYLENOL ) tablet 650 mg  650 mg Oral Once Ennever, Peter R, MD  nitroGLYCERIN  (NITROSTAT ) SL tablet 0.4 mg  0.4 mg Sublingual Once Blyth, Stacey A, MD       sodium chloride  flush (NS) 0.9 % injection 10 mL  10 mL Intracatheter PRN Timmy Maude SAUNDERS, MD   10 mL at 09/24/23 1640    Allergies  Allergen Reactions   Benazepril  Anaphylaxis, Swelling and Hives    angioedema Throat and lip swelling   Ondansetron  Hcl Hives    Redness and hives post IV admin on 07/05/17   Codeine Nausea And Vomiting   Morphine Hives    Redness and hives noted post IV admin on 07/05/17    ROS See HPI    Objective:    Physical Exam  General: No acute distress. Awake and conversant.  Eyes: Normal conjunctiva, anicteric. Round symmetric pupils.  Respiratory: CTAB. Respirations are non-labored. No wheezing.  Skin: Warm. No rashes or ulcers.  Psych: Alert and oriented. Cooperative, Appropriate mood and  affect, Normal judgment.  CV: RRR. No murmur. No lower extremity edema.  MSK: Normal ambulation. No clubbing or cyanosis.    There were no vitals taken for this visit. Wt Readings from Last 3 Encounters:  06/13/24 191 lb (86.6 kg)  06/04/24 192 lb 0.3 oz (87.1 kg)  06/02/24 192 lb (87.1 kg)       Assessment & Plan:   Problem List Items Addressed This Visit     AP (abdominal pain) - Primary   Chronic.  Followed by GI.  Last seen by GI 06/13/2024 started on dicyclomine , (Bentyl ) 3 times daily. Recently went to the emergency department for abdominal pain-no acute findings; benign abdominal exam. Last OV per Dr. Malva -Carafate  prescribed, reports no improvement with this medication.  Continue to follow-up with GI.        Chronic combined systolic and diastolic congestive heart failure (HCC)   No recent exacerbation, follows with cardiology.      Other Visit Diagnoses       Postmenopausal estrogen deficiency            I am having Tami [name] maintain her fluticasone , furosemide , losartan , cetirizine , Vitamin D3, Magnesium  Glycinate, meclizine , metoprolol  succinate, aspirin  EC, loperamide , spironolactone , hydrOXYzine , cholestyramine  light, sertraline , lidocaine -prilocaine , scopolamine , montelukast , Multiple Vitamins-Minerals (CENTRUM SILVER  WOMEN 50+ PO), ondansetron , acetaminophen , oxyCODONE -acetaminophen , pantoprazole , famotidine , amLODipine , Pillow Mask/Adult, potassium chloride  SA, prochlorperazine , albuterol , albuterol , budesonide , cyclobenzaprine , ferrous sulfate, estradiol , phenazopyridine , lidocaine , acyclovir , metroNIDAZOLE , diphenhydrAMINE , sulfamethoxazole -trimethoprim , fluconazole , sucralfate , neomycin -polymyxin-hydrocortisone, Lomotil , amoxicillin , dicyclomine , and Gemtesa . We will continue to administer nitroGLYCERIN .  No orders of the defined types were placed in this encounter.

## 2024-06-16 ENCOUNTER — Ambulatory Visit: Payer: Self-pay

## 2024-06-16 ENCOUNTER — Ambulatory Visit: Admitting: Student

## 2024-06-16 DIAGNOSIS — Z780 Asymptomatic menopausal state: Secondary | ICD-10-CM

## 2024-06-16 DIAGNOSIS — I5042 Chronic combined systolic (congestive) and diastolic (congestive) heart failure: Secondary | ICD-10-CM

## 2024-06-16 DIAGNOSIS — R109 Unspecified abdominal pain: Secondary | ICD-10-CM

## 2024-06-16 NOTE — Telephone Encounter (Signed)
2nd attempt, no answer, LVMTCB.

## 2024-06-16 NOTE — Telephone Encounter (Signed)
 1st attempt. [email or web address]  Copied from CRM [id number]. Topic: Clinical - Pink Word Triage >> Jun 16, 2024 12:07 PM Gennette ORN wrote: Patient is calling in due to still having the diarrhea going on. Informed patient she will get a call back.

## 2024-06-16 NOTE — Telephone Encounter (Signed)
 3nd attempt, no answer, LVMTCB.

## 2024-06-20 ENCOUNTER — Ambulatory Visit (INDEPENDENT_AMBULATORY_CARE_PROVIDER_SITE_OTHER): Admitting: Student

## 2024-06-20 ENCOUNTER — Encounter: Payer: Self-pay | Admitting: *Deleted

## 2024-06-20 DIAGNOSIS — Z91199 Patient's noncompliance with other medical treatment and regimen due to unspecified reason: Secondary | ICD-10-CM

## 2024-06-20 NOTE — Progress Notes (Signed)
 Subjective:     [name], female    DOB: [birth year], 68 y.o.   MRN: [id number]  No chief complaint on file.  ERROR- PT NO SHOW   HPI Tami [name] is a 68yo F presents for follow up on chronic conditions, and recently seen in emergency room.   Followed by cardiology, urogyn, infectious disease, hematology/oncology, GI  Seen in ED on 06/08/2024 for abdominal pain. Per ED note: Patient evaluated for multiple symptoms with mild thoracic spine tenderness and a benign abdominal exam; labs and recent abdominal CT were unremarkable. CT T-spine showed mild dextroscoliosis. Her symptoms improved with fluids and antiemetic medications.  Chronic Diarrhea/ N/V/ Abdominal Pain- followed by GI Last seen by GI 06/13/24- Started on  Dicyclomine  (Bentyl ) 1 tablet three times daily.   Continue compazine  for nausea  Continue Pantoprazole  40 mg po daily  Continue Pepcid  20 mg po daily  She continues to experience intermittent diarrhea and has required fluid replacement. Bowel movements are now typically 3-4/ soft stools per day, though she reports increased over the past few days, denies recent antibiotics and stool incontinence. She reports taking Imodium  twice daily, and dycylomine (Bentyl ) 10 mg twice daily, and Pantoprazole  40 mg daily. Bowel movements usually occur after meals. +abdominal cramping. Denies loss of appetite and making efforts to maintain hydration and electrolyte balance.   Hypomagnesia She continues takes magnesium  glycinate 200 mg daily spilt in AM and HS per Heme/Onc. Pt is also receiving magnesium  IVPB infusions. Magnesium  levels have been stable.  Multiple Myeloma Patient has not had chemotherapy treatments since January per oncology.  Hypertension- followed by cardiology [facility] Amlodipine  5 mg daily Losartan  (Cozaar ) 50 mg twice daily Metoprolol  50 mg twice daily Spirolactone 25 mg daily  Compliant with medications  HCM Last DEXA 2022,  due  Patient denies fever, chills, SOB, CP, palpitations, dyspnea, edema, HA, vision changes, N/V, urinary symptoms, rash, weight changes,    BP Readings from Last 1 Encounters:  06/13/24 108/80    History of Present Illness              Health Maintenance Due  Topic Date Due   DEXA SCAN  11/07/2023   INFLUENZA VACCINE  06/09/2024   Medicare Annual Wellness (AWV)  07/07/2024    Past Medical History:  Diagnosis Date   Abnormal SPEP 07/26/2016   Arthritis    knees, hands   Back pain 07/14/2016   Bowel obstruction (HCC) 11/2014   C. 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   Multiple myeloma (HCC) 07/20/2018   Multiple myeloma not having achieved remission (HCC) 07/20/2018   Myalgia 12/31/2016   Obstructive sleep apnea 03/31/2015   Presence of permanent cardiac pacemaker    SOB (shortness of breath) 04/09/2016   Stroke (HCC)    TIAs   UTI (urinary tract infection)     Past Surgical History:  Procedure Laterality Date   ABDOMINAL HYSTERECTOMY     menorraghia, 2006, total   BIOPSY  09/01/2022   Procedure: BIOPSY;  Surgeon: Charlanne Groom, MD;  Location: [facility];  Service: Gastroenterology;;   CHOLECYSTECTOMY     COLONOSCOPY  2018   [facility] per patient   COLONOSCOPY WITH PROPOFOL  N/A 09/01/2022   Procedure: COLONOSCOPY WITH PROPOFOL ;  Surgeon: Charlanne Groom, MD;  Location: [facility];  Service: Gastroenterology;  Laterality: N/A;   ESOPHAGOGASTRODUODENOSCOPY  2018   [facility]     ESOPHAGOGASTRODUODENOSCOPY (EGD) WITH PROPOFOL  N/A 09/01/2022   Procedure: ESOPHAGOGASTRODUODENOSCOPY (EGD) WITH PROPOFOL ;  Surgeon: Charlanne Groom, MD;  Location: [facility];  Service: Gastroenterology;  Laterality: N/A;   IR IMAGING GUIDED PORT INSERTION  06/29/2023   KNEE SURGERY     right, repair torn torn cartialage   PACEMAKER INSERTION  08/2014   changed last in 2023 [facility]   TONSILLECTOMY     TUBAL LIGATION      Family History  Problem Relation Age of Onset   Diabetes Mother    Hypertension Mother    Heart disease Mother        s/p 1 stent   Hyperlipidemia Mother    Arthritis Mother    Colon cancer Father 64   Irritable bowel syndrome Sister    Hypertension Maternal Grandmother    Arthritis Maternal Grandmother    Heart disease Maternal Grandfather        MI   Hypertension Maternal Grandfather    Arthritis Maternal Grandfather    Hyperlipidemia Daughter    Hypertension Daughter    Hypertension Son    Esophageal cancer Neg Hx    Uterine cancer Neg Hx    Pancreatic cancer Neg Hx     Social History   Socioeconomic History   Marital status: Single    Spouse name: [name]   Number of children: 3   Years of education: [name]   Highest education level: High school graduate  Occupational History   [name]  Tobacco Use   Smoking status: Never    Passive exposure: Never   Smokeless tobacco: Never  Vaping Use   Vaping status: Never Used  Substance and Sexual Activity   Alcohol use: No   Drug use: No   Sexual activity: Not Currently    Partners: Male    Birth control/protection: Post-menopausal  Other Topics Concern   [name]  Social History Narrative   Lives at home alone   Retired   Caffeine: coffee   Social Drivers of Health   Financial Resource Strain: Medium Risk (07/08/2023)   Overall Financial Resource Strain (CARDIA)    Difficulty of Paying Living Expenses: Somewhat hard  Food Insecurity: Low Risk  (05/18/2024)    Received from [facility]   Hunger Vital Sign    Within the past 12 months, you worried that your food would run out before you got money to buy more: Never true    Within the past 12 months, the food you bought just didn't last and you didn't have money to get more. : Never true  Transportation Needs: No Transportation Needs (04/29/2024)   Received from [facility]    In the past 12 months, has lack of reliable transportation kept you from medical appointments, meetings, work or from getting things needed for daily living? : No  Physical Activity: Inactive (07/08/2023)   Exercise Vital Sign    Days of Exercise per Week: 0 days    Minutes of Exercise per Session: 0 min  Stress: No Stress Concern Present (01/21/2023)   [employer] of Occupational Health - Occupational Stress Questionnaire    Feeling of Stress : Not at all  Social Connections: Moderately Isolated (07/08/2023)   Social Connection and Isolation Panel    Frequency of Communication with Friends and Family: More than three times a week    Frequency of Social Gatherings with Friends and Family: Twice a week    Attends Religious Services: More than 4 times per year    Active Member of [employer] or Organizations: No    Attends Banker Meetings: Never    Marital Status: Divorced  Catering manager Violence: Unknown (11/29/2023)   Humiliation, Afraid, Rape, and Kick questionnaire    Fear of Current or Ex-Partner: No    Emotionally Abused: No    Physically Abused: No    Sexually Abused: [name]    Outpatient Medications Prior to Visit  Medication Sig Dispense Refill   acetaminophen  (TYLENOL ) 500 MG tablet Take 500 mg by mouth every 6 (six) hours as needed.     acyclovir  (ZOVIRAX ) 400 MG tablet TAKE ONE (1) TABLET BY MOUTH TWO (2) TIMES DAILY 60 tablet 3   albuterol  (PROVENTIL ) (2.5 MG/3ML) 0.083% nebulizer solution Take 3 mLs (2.5 mg total) by nebulization every 6 (six) hours as needed for  wheezing or shortness of breath. 150 mL 2   albuterol  (VENTOLIN  HFA) 108 (90 Base) MCG/ACT inhaler Inhale 2 puffs into the lungs every 6 (six) hours as needed for wheezing or shortness of breath. 18 g 5   amLODipine  (NORVASC ) 5 MG tablet Take 5 mg by mouth daily. (Patient not taking: Reported on 06/13/2024)     amoxicillin  (AMOXIL ) 500 MG capsule Take 500 mg by mouth 3 (three) times daily.     aspirin  EC 81 MG tablet Take 81 mg by mouth daily. Swallow whole.     budesonide  (PULMICORT ) 0.5 MG/2ML nebulizer solution Take 2 mLs (0.5 mg total) by nebulization 2 (two) times daily as needed (cough, wheezing or shortness of breath). 120 mL 11   cetirizine  (ZYRTEC ) 10 MG tablet Take 10 mg by mouth at bedtime.     Cholecalciferol (VITAMIN D3) 50 MCG (2000 UT) TABS Take 2,000 Units by mouth in the morning.     cholestyramine  light (PREVALITE ) 4 g packet Take 4 g by mouth 2 (two) times daily.     cyclobenzaprine  (FLEXERIL ) 5 MG tablet Take 1 tablet (5 mg total) by mouth 2 (two) times daily as needed for up to 10 doses for muscle spasms. 10 tablet 0   dicyclomine  (BENTYL ) 20 MG tablet Take 1 tablet (20 mg total) by mouth 3 (three) times daily before meals. 90 tablet 2   diphenhydrAMINE  (BENADRYL ) 25 MG tablet Take 25 mg by mouth.     estradiol  (ESTRACE ) 0.1 MG/GM vaginal cream Place 0.5 g vaginally 2 (two) times a week. Place 0.5g nightly for two weeks then twice a week after 30 g 3   famotidine  (PEPCID ) 20 MG tablet Take 1 tablet (20 mg total) by mouth at bedtime. (Patient taking differently: Take 40 mg by mouth at bedtime.) 30 tablet 2   ferrous sulfate (FEROSUL) 325 (65 FE) MG tablet Take 1 tablet (325 mg total) by mouth daily with breakfast. 90 tablet 0   fluconazole  (DIFLUCAN ) 150 MG tablet Take by mouth.     fluticasone  (FLONASE ) 50 MCG/ACT nasal spray Place 2 sprays into both nostrils daily. 16 g 0   furosemide  (LASIX ) 20 MG  tablet Take 20 mg by mouth daily as needed (fluid retention (feet swelling)).      hydrOXYzine  (ATARAX ) 10 MG tablet TAKE ONE (1) TABLET BY MOUTH 3 TIMES DAILY AS NEEDED FOR ITCHING 30 tablet 2   lidocaine  (XYLOCAINE ) 5 % ointment Apply 1 Application topically as needed. 35.44 g 0   lidocaine -prilocaine  (EMLA ) cream Apply 1 Application topically as needed. 30 g 0   LOMOTIL  2.5-0.025 MG tablet Take 1 tablet by mouth 2 (two) times daily. 60 tablet 6   loperamide  (IMODIUM ) 2 MG capsule Take 4 mg by mouth in the morning and at bedtime.     losartan  (COZAAR ) 50 MG tablet Take 50 mg by mouth in the morning and at bedtime.     Magnesium  Glycinate 100 MG CAPS Take 200 mg by mouth at bedtime.     meclizine  (ANTIVERT ) 12.5 MG tablet Take 12.5 mg by mouth 2 (two) times daily as needed for dizziness or nausea.     metoprolol  succinate (TOPROL -XL) 50 MG 24 hr tablet Take 1 tablet (50 mg total) by mouth 2 (two) times daily. 60 tablet 2   metroNIDAZOLE  (METROGEL ) 0.75 % gel Insert One applicatorful (5 g ) vaginally at bedtime for 5 days 45 g 0   montelukast  (SINGULAIR ) 10 MG tablet Take 1 tablet (10 mg total) by mouth at bedtime. Please start taking this on September 4. 30 tablet 3   Multiple Vitamins-Minerals (CENTRUM SILVER  WOMEN 50+ PO) Take 1 tablet by mouth daily at 6 (six) AM.     neomycin -polymyxin-hydrocortisone (CORTISPORIN) OTIC solution Place 4 drops into both ears 4 (four) times daily. 10 mL 4   ondansetron  (ZOFRAN ) 8 MG tablet Take 1 tablet (8 mg total) by mouth every 8 (eight) hours as needed for nausea or vomiting. 30 tablet 1   oxyCODONE -acetaminophen  (PERCOCET) 5-325 MG tablet Take 2 tablets by mouth every 6 (six) hours as needed. 10 tablet 0   pantoprazole  (PROTONIX ) 40 MG tablet Take 1 tablet (40 mg total) by mouth daily. (Patient not taking: Reported on 06/13/2024) 30 tablet 3   phenazopyridine  (PYRIDIUM ) 200 MG tablet Take 1 tablet (200 mg total) by mouth 3 (three) times daily as needed for pain. 30 tablet 0   potassium chloride  SA (KLOR-CON  M) 20 MEQ tablet TAKE 1 TABLET BY  MOUTH EVERY MONDAY, WEDNESDAY, AND FRIDAY. 30 tablet 5   prochlorperazine  (COMPAZINE ) 10 MG tablet Take 0.5 tablets (5 mg total) by mouth 2 (two) times daily as needed for nausea or vomiting. 60 tablet 1   Respiratory Therapy Supplies (PILLOW MASK/ADULT) MISC      scopolamine  (TRANSDERM-SCOP) 1 MG/3DAYS Place 1 patch (1.5 mg total) onto the skin every 3 (three) days. 10 patch 1   sertraline  (ZOLOFT ) 50 MG tablet Take 1 tablet (50 mg total) by mouth daily. (Patient not taking: Reported on 06/13/2024) 30 tablet 3   spironolactone  (ALDACTONE ) 25 MG tablet Take 25 mg by mouth daily.     sucralfate  (CARAFATE ) 1 GM/10ML suspension Take 10 mLs (1 g total) by mouth 4 (four) times daily -  with meals and at bedtime. 473 mL 1   sulfamethoxazole -trimethoprim  (BACTRIM  DS) 800-160 MG tablet Take 1 tablet by mouth 2 (two) times daily.     Vibegron  (GEMTESA ) 75 MG TABS Take 1 tablet (75 mg total) by mouth daily. 30 tablet 2   Facility-Administered Medications Prior to Visit  Medication Dose Route Frequency Provider Last Rate Last Admin   acetaminophen  (TYLENOL ) tablet 650 mg  650 mg  Oral Once Ennever, Peter R, MD       nitroGLYCERIN  (NITROSTAT ) SL tablet 0.4 mg  0.4 mg Sublingual Once Blyth, Stacey A, MD       sodium chloride  flush (NS) 0.9 % injection 10 mL  10 mL Intracatheter PRN Timmy Maude SAUNDERS, MD   10 mL at 09/24/23 1640    Allergies  Allergen Reactions   Benazepril  Anaphylaxis, Swelling and Hives    angioedema Throat and lip swelling   Ondansetron  Hcl Hives    Redness and hives post IV admin on 07/05/17   Codeine Nausea And Vomiting   Morphine Hives    Redness and hives noted post IV admin on 07/05/17    ROS See HPI    Objective:    Physical Exam    There were no vitals taken for this visit. Wt Readings from Last 3 Encounters:  06/13/24 191 lb (86.6 kg)  06/04/24 192 lb 0.3 oz (87.1 kg)  06/02/24 192 lb (87.1 kg)       Assessment & Plan:   Problem List Items Addressed This  Visit   None     I am having Tami A. Drumwright maintain her fluticasone , furosemide , losartan , cetirizine , Vitamin D3, Magnesium  Glycinate, meclizine , metoprolol  succinate, aspirin  EC, loperamide , spironolactone , hydrOXYzine , cholestyramine  light, sertraline , lidocaine -prilocaine , scopolamine , montelukast , Multiple Vitamins-Minerals (CENTRUM SILVER  WOMEN 50+ PO), ondansetron , acetaminophen , oxyCODONE -acetaminophen , pantoprazole , famotidine , amLODipine , Pillow Mask/Adult, potassium chloride  SA, prochlorperazine , albuterol , albuterol , budesonide , cyclobenzaprine , ferrous sulfate, estradiol , phenazopyridine , lidocaine , acyclovir , metroNIDAZOLE , diphenhydrAMINE , sulfamethoxazole -trimethoprim , fluconazole , sucralfate , neomycin -polymyxin-hydrocortisone, Lomotil , amoxicillin , dicyclomine , and Gemtesa . We will continue to administer nitroGLYCERIN .  No orders of the defined types were placed in this encounter.

## 2024-06-21 ENCOUNTER — Encounter: Payer: Self-pay | Admitting: Student

## 2024-06-22 ENCOUNTER — Telehealth: Payer: Self-pay | Admitting: Gastroenterology

## 2024-06-22 DIAGNOSIS — G8929 Other chronic pain: Secondary | ICD-10-CM

## 2024-06-22 DIAGNOSIS — R630 Anorexia: Secondary | ICD-10-CM

## 2024-06-22 DIAGNOSIS — R112 Nausea with vomiting, unspecified: Secondary | ICD-10-CM

## 2024-06-22 NOTE — Telephone Encounter (Signed)
 Returned call to patient & she would like to follow up with Deanna to see if she needs to have an EGD. She is still experiencing upper abdominal pain, nausea, and vomiting. She's taking all medications as prescribed with no relief. She's only able to hold down fluids. Last seen for OV 06/13/24.

## 2024-06-22 NOTE — Telephone Encounter (Signed)
 Received call from patient, she wanted to know if the EGD will still be scheduled for her. States she's in pain and would like an appointment as soon as possible. Please review and advise  Thank you

## 2024-06-23 ENCOUNTER — Telehealth: Payer: Self-pay

## 2024-06-23 ENCOUNTER — Inpatient Hospital Stay

## 2024-06-23 ENCOUNTER — Other Ambulatory Visit: Payer: Self-pay

## 2024-06-23 ENCOUNTER — Inpatient Hospital Stay: Attending: Hematology & Oncology

## 2024-06-23 DIAGNOSIS — C9000 Multiple myeloma not having achieved remission: Secondary | ICD-10-CM | POA: Diagnosis not present

## 2024-06-23 DIAGNOSIS — R112 Nausea with vomiting, unspecified: Secondary | ICD-10-CM | POA: Diagnosis not present

## 2024-06-23 LAB — CMP (CANCER CENTER ONLY)
ALT: 29 U/L (ref 0–44)
AST: 22 U/L (ref 15–41)
Albumin: 4.2 g/dL (ref 3.5–5.0)
Alkaline Phosphatase: 57 U/L (ref 38–126)
Anion gap: 12 (ref 5–15)
BUN: 15 mg/dL (ref 8–23)
CO2: 23 mmol/L (ref 22–32)
Calcium: 9.3 mg/dL (ref 8.9–10.3)
Chloride: 108 mmol/L (ref 98–111)
Creatinine: 0.59 mg/dL (ref 0.44–1.00)
GFR, Estimated: >60 mL/min (ref >60)
Glucose, Bld: 111 mg/dL — ABNORMAL HIGH (ref 70–99)
Potassium: 4 mmol/L (ref 3.5–5.1)
Sodium: 143 mmol/L (ref 135–145)
Total Bilirubin: 0.6 mg/dL (ref 0.0–1.2)
Total Protein: 6.7 g/dL (ref 6.5–8.1)

## 2024-06-23 LAB — CBC WITH DIFFERENTIAL (CANCER CENTER ONLY)
Abs Immature Granulocytes: 0.02 K/uL (ref 0.00–0.07)
Basophils Absolute: 0 K/uL (ref 0.0–0.1)
Basophils Relative: 1 %
Eosinophils Absolute: 0.1 K/uL (ref 0.0–0.5)
Eosinophils Relative: 2 %
HCT: 37 % (ref 36.0–46.0)
Hemoglobin: 12.1 g/dL (ref 12.0–15.0)
Immature Granulocytes: 1 %
Lymphocytes Relative: 24 %
Lymphs Abs: 1 K/uL (ref 0.7–4.0)
MCH: 30.4 pg (ref 26.0–34.0)
MCHC: 32.7 g/dL (ref 30.0–36.0)
MCV: 93 fL (ref 80.0–100.0)
Monocytes Absolute: 0.4 K/uL (ref 0.1–1.0)
Monocytes Relative: 9 %
Neutro Abs: 2.7 K/uL (ref 1.7–7.7)
Neutrophils Relative %: 63 %
Platelet Count: 145 K/uL — ABNORMAL LOW (ref 150–400)
RBC: 3.98 MIL/uL (ref 3.87–5.11)
RDW: 12.8 % (ref 11.5–15.5)
WBC Count: 4.2 K/uL (ref 4.0–10.5)
nRBC: 0 % (ref 0.0–0.2)

## 2024-06-23 LAB — MAGNESIUM: Magnesium: 2.1 mg/dL (ref 1.7–2.4)

## 2024-06-23 MED ORDER — SODIUM CHLORIDE 0.9 % IV SOLN: Freq: Once | INTRAVENOUS | Status: AC

## 2024-06-23 NOTE — Patient Instructions (Signed)
 Dehydration, Adult Dehydration is a condition in which there is not enough water or other fluids in the body. This happens when a person loses more fluids than they take in. Important organs cannot work right without the right amount of fluids. Any loss of fluids from the body can cause dehydration. Dehydration can be mild, worse, or very bad. It should be treated right away to keep it from getting very bad. What are the causes? Conditions that cause loss of water in the body. They include: Watery poop (diarrhea). Vomiting. Sweating a lot. Fever. Infection. Peeing (urinating) a lot. Not drinking enough fluids. Certain medicines, such as medicines that take extra fluid out of the body (diuretics). Lack of safe drinking water. Not being able to get enough water and food. What increases the risk? Having a long-term (chronic) illness that has not been treated the right way, such as: Diabetes. Heart disease. Kidney disease. Being 25 years of age or older. Having a disability. Living in a place that is high above the ground or sea (high in altitude). The thinner, drier air causes more fluid loss. Doing exercises that put stress on your body for a long time. Being active when in hot places. What are the signs or symptoms? Symptoms of dehydration depend on how bad it is. Mild or worse dehydration Thirst. Dry lips or dry mouth. Feeling dizzy or light-headed. Muscle cramps. Passing little pee or dark pee. Pee may be the color of tea. Headache. Very bad dehydration Changes in skin. Skin may: Be cold to the touch (clammy). Be blotchy or pale. Not go back to normal right after you pinch it and let it go. Little or no tears, pee, or sweat. Fast breathing. Low blood pressure. Weak pulse. Pulse that is more than 100 beats a minute when you are sitting still. Other changes, such as: Feeling very thirsty. Eyes that look hollow (sunken). Cold hands and feet. Being confused. Being very  tired (lethargic) or having trouble waking from sleep. Losing weight. Loss of consciousness. How is this treated? Treatment for this condition depends on how bad your dehydration is. Treatment should start right away. Do not wait until your condition gets very bad. Very bad dehydration is an emergency. You will need to go to a hospital. Mild or worse dehydration can be treated at home. You may be asked to: Drink more fluids. Drink an oral rehydration solution (ORS). This drink gives you the right amount of fluids, salts, and minerals (electrolytes). Very bad dehydration can be treated: With fluids through an IV tube. By correcting low levels of electrolytes in the body. By treating the problem that caused your dehydration. Follow these instructions at home: Oral rehydration solution If told by your doctor, drink an ORS: Make an ORS. Use instructions on the package. Start by drinking small amounts, about  cup (120 mL) every 5-10 minutes. Slowly drink more until you have had the amount that your doctor said to have.  Eating and drinking  Drink enough clear fluid to keep your pee pale yellow. If you were told to drink an ORS, finish the ORS first. Then, start slowly drinking other clear fluids. Drink fluids such as: Water. Do not drink only water. Doing that can make the salt (sodium) level in your body get too low. Water from ice chips you suck on. Fruit juice that you have added water to (diluted). Low-calorie sports drinks. Eat foods that have the right amounts of salts and minerals, such as bananas, oranges, potatoes,  tomatoes, or spinach. Do not drink alcohol. Avoid drinks that have caffeine or sugar. These include:: High-calorie sports drinks. Fruit juice that you did not add water to. Soda. Coffee or energy drinks. Avoid foods that are greasy or have a lot of fat or sugar. General instructions Take over-the-counter and prescription medicines only as told by your doctor. Do  not take sodium tablets. Doing that can make the salt level in your body get too high. Return to your normal activities as told by your doctor. Ask your doctor what activities are safe for you. Keep all follow-up visits. Your doctor may check and change your treatment. Contact a doctor if: You have pain in your belly (abdomen) and the pain: Gets worse. Stays in one place. You have a rash. You have a stiff neck. You get angry or annoyed more easily than normal. You are more tired or have a harder time waking than normal. You feel weak or dizzy. You feel very thirsty. Get help right away if: You have any symptoms of very bad dehydration. You vomit every time you eat or drink. Your vomiting gets worse, does not go away, or you vomit blood or green stuff. You are getting treatment, but symptoms are getting worse. You have a fever. You have a very bad headache. You have: Diarrhea that gets worse or does not go away. Blood in your poop (stool). This may cause poop to look black and tarry. No pee in 6-8 hours. Only a small amount of pee in 6-8 hours, and the pee is very dark. You have trouble breathing. These symptoms may be an emergency. Get help right away. Call 911. Do not wait to see if the symptoms will go away. Do not drive yourself to the hospital. This information is not intended to replace advice given to you by your health care provider. Make sure you discuss any questions you have with your health care provider. Document Revised: 05/25/2022 Document Reviewed: 05/25/2022 Elsevier Patient Education  2024 [employer].

## 2024-06-23 NOTE — Telephone Encounter (Signed)
 Received phone call from patient stating that she is very weak and having increased diarrhea for the past week. Pt states she tried to be seen in the ER last night but the wait was too long. Pt states she was on the way to the ER currently but if she can get fluids here she would feel better.  Dr. Timmy aware and orders received for labs and fluids.  Pt aware and thankful for help.

## 2024-06-25 DIAGNOSIS — K529 Noninfective gastroenteritis and colitis, unspecified: Secondary | ICD-10-CM | POA: Diagnosis not present

## 2024-06-25 DIAGNOSIS — R197 Diarrhea, unspecified: Secondary | ICD-10-CM | POA: Diagnosis not present

## 2024-06-26 ENCOUNTER — Telehealth: Payer: Self-pay | Admitting: Gastroenterology

## 2024-06-26 DIAGNOSIS — Z95810 Presence of automatic (implantable) cardiac defibrillator: Secondary | ICD-10-CM | POA: Diagnosis not present

## 2024-06-26 NOTE — Telephone Encounter (Signed)
 PT healthcare POA is calling to find out if the EGD is still going to be scheduled. PT is still not getting any relief and they want to know if the ED would be the next best thing. Please advise.

## 2024-06-26 NOTE — Telephone Encounter (Signed)
 Please see notes below on scheduling EGD

## 2024-06-28 NOTE — Telephone Encounter (Signed)
 Dr. Charlanne has advised that it is fine to proceed with EGD. Patient last had procedures in hospital setting, will likely need cardiac clearance as well.   Called and spoke with patient. She asked that I call Tami [name] to schedule. Called [place] and had to leave a vm for her to give us  a call back.

## 2024-06-28 NOTE — Telephone Encounter (Signed)
 Patient returning a call  Requesting a call back  Please advise  Thank you

## 2024-06-28 NOTE — Telephone Encounter (Signed)
 error

## 2024-06-28 NOTE — Telephone Encounter (Signed)
 Awaiting chart review from Norleen Schillings, CRNA to determine if patient can be scheduled in the [facility]

## 2024-06-28 NOTE — Telephone Encounter (Signed)
 Still awaiting Dr. Ira response. Secure chat has been sent to Dr. Charlanne.

## 2024-06-28 NOTE — Telephone Encounter (Signed)
 Tami [name] is returning call to discuss scheduling EGD. She fears that partient is getting worse and feels that we are not taking into account how serious this is. She wants to take PT to ED and have Dr. Charlanne come and see her. Requesting to speak with nurse.

## 2024-06-29 ENCOUNTER — Other Ambulatory Visit: Payer: Self-pay

## 2024-06-29 ENCOUNTER — Telehealth: Payer: Self-pay | Admitting: *Deleted

## 2024-06-29 DIAGNOSIS — R630 Anorexia: Secondary | ICD-10-CM

## 2024-06-29 DIAGNOSIS — G8929 Other chronic pain: Secondary | ICD-10-CM

## 2024-06-29 DIAGNOSIS — R112 Nausea with vomiting, unspecified: Secondary | ICD-10-CM

## 2024-06-29 NOTE — Telephone Encounter (Signed)
 Dr. San had an opening at [facility] on Monday, 07/03/24 at 10:45 am. I called and Jereld states that patient will make that appt. Jereld is aware that patient will need to arrive at [facility] by 9:15 am. Jereld denies that patient is anticoagulated or taking any diabetic meds, or weight loss medications. Jereld has access to patient's MyChart and knows to expect formal EGD instructions there. [name] verbalized understanding and had no concerns at the end of the call.   Urgent ambulatory referral to GI in epic. Secure chat sent to pre-certification team to obtain auth since procedure is Monday.

## 2024-06-29 NOTE — Telephone Encounter (Signed)
 Tami [name], please advise if patient should proceed to ED for expedited evaluation or if EGD can be scheduled with another provider in hospital setting. Dr. Ira next available hospital spot will not be until October.

## 2024-06-29 NOTE — Telephone Encounter (Signed)
 Per Norleen, Low EF and needs to be completed at [facility].

## 2024-06-29 NOTE — Telephone Encounter (Signed)
 Patient with continued epigastric plain, intermittent nausea/vomiting. Please see Tami [name]'s note for details Regarding touch with Tami [name]-not a candidate for EGD at [facility] due to reduced ejection fraction  Certainly high risk for any endoscopic procedures  Tami [name], However, given continued symptoms, lets proceed with a EGD at [facility] (please use my next open slot) If she has significant problems, then she needs to go to ED for expedited [facility]

## 2024-06-29 NOTE — Telephone Encounter (Signed)
 Dr. Charlanne,  This pt's cardiac EF is below the [facility] standard.  Her procedure will need to be done at the hospital.  Thanks,  Norleen Schillings

## 2024-06-29 NOTE — Telephone Encounter (Signed)
 Spoke with [facility] & they asked that clearance form be faxed to Dr. Isobel nurse Delon Sick at [phone]. Urgent clearance faxed.

## 2024-06-30 NOTE — Telephone Encounter (Signed)
 Pt stated that she has not received her prep instructions. Pt was notified that they was sent to her my chart. Pt stated that she does not use the my chart, her POA does. Pt requested to pick up prep instructions today. Pt was notified that I have printed them and that they are available for pick up on the second floor.  Pt verbalized understanding with all questions answered.

## 2024-06-30 NOTE — Telephone Encounter (Addendum)
 Spoke with nurse Delon from [facility]. Received fax from her office & she also provided verbal clearance stating Odis Phoenix, PA has given clearance for patient to proceed with procedure on Monday as planned. They have already called the patient & had her increase her metoprolol  to 100 mg from 50 mg for better rate and BP control prior to procedure, and r/o any new heart failure symptoms.

## 2024-06-30 NOTE — Telephone Encounter (Signed)
 Chart reviewed and noted that pt has been previously scheduled for the EGD at the the hospital on 07/03/2024 with Dr. San.

## 2024-06-30 NOTE — Telephone Encounter (Signed)
 Patient is returning a phone call, please advise thank you

## 2024-06-30 NOTE — Telephone Encounter (Signed)
 Called to follow up on cardiac clearance & spoke with Iva who stated the provider was off today, however nurse Delon is currently working on getting another provider to sign off and will contact me shortly. Direct number provided as well.

## 2024-07-01 ENCOUNTER — Telehealth: Payer: Self-pay | Admitting: Internal Medicine

## 2024-07-01 NOTE — Telephone Encounter (Signed)
 Received a phone call to the [facility] on-call pager.  Patient was inquiring about whether or not she needed to have any of her medications held before her EGD procedure on Monday.  I reviewed her medication list and told her that the only thing I would hold for now is the oral iron , which she can restart after her procedure.

## 2024-07-02 ENCOUNTER — Other Ambulatory Visit: Payer: Self-pay

## 2024-07-02 ENCOUNTER — Encounter (HOSPITAL_BASED_OUTPATIENT_CLINIC_OR_DEPARTMENT_OTHER): Payer: Self-pay | Admitting: Emergency Medicine

## 2024-07-02 ENCOUNTER — Emergency Department (HOSPITAL_BASED_OUTPATIENT_CLINIC_OR_DEPARTMENT_OTHER)
Admission: EM | Admit: 2024-07-02 | Discharge: 2024-07-02 | Disposition: A | Discharge status: Home / Self Care | Attending: Emergency Medicine | Admitting: Emergency Medicine

## 2024-07-02 DIAGNOSIS — K529 Noninfective gastroenteritis and colitis, unspecified: Secondary | ICD-10-CM | POA: Insufficient documentation

## 2024-07-02 DIAGNOSIS — Z7982 Long term (current) use of aspirin: Secondary | ICD-10-CM | POA: Diagnosis not present

## 2024-07-02 DIAGNOSIS — R197 Diarrhea, unspecified: Secondary | ICD-10-CM | POA: Diagnosis not present

## 2024-07-02 LAB — CBC WITH DIFFERENTIAL/PLATELET
Abs Immature Granulocytes: 0.02 K/uL (ref 0.00–0.07)
Basophils Absolute: 0.1 K/uL (ref 0.0–0.1)
Basophils Relative: 1 %
Eosinophils Absolute: 0.1 K/uL (ref 0.0–0.5)
Eosinophils Relative: 2 %
HCT: 38.8 % (ref 36.0–46.0)
Hemoglobin: 12.6 g/dL (ref 12.0–15.0)
Immature Granulocytes: 0 %
Lymphocytes Relative: 32 %
Lymphs Abs: 1.5 K/uL (ref 0.7–4.0)
MCH: 29.9 pg (ref 26.0–34.0)
MCHC: 32.5 g/dL (ref 30.0–36.0)
MCV: 92.2 fL (ref 80.0–100.0)
Monocytes Absolute: 0.4 K/uL (ref 0.1–1.0)
Monocytes Relative: 8 %
Neutro Abs: 2.6 K/uL (ref 1.7–7.7)
Neutrophils Relative %: 57 %
Platelets: 183 K/uL (ref 150–400)
RBC: 4.21 MIL/uL (ref 3.87–5.11)
RDW: 12.6 % (ref 11.5–15.5)
WBC: 4.6 K/uL (ref 4.0–10.5)
nRBC: 0 % (ref 0.0–0.2)

## 2024-07-02 LAB — URINALYSIS, ROUTINE W REFLEX MICROSCOPIC
Bilirubin Urine: NEGATIVE
Glucose, UA: NEGATIVE mg/dL
Hgb urine dipstick: NEGATIVE
Ketones, ur: NEGATIVE mg/dL
Nitrite: POSITIVE — AB
Protein, ur: NEGATIVE mg/dL
Specific Gravity, Urine: 1.025 (ref 1.005–1.030)
pH: 5.5 (ref 5.0–8.0)

## 2024-07-02 LAB — COMPREHENSIVE METABOLIC PANEL WITH GFR
ALT: 19 U/L (ref 0–44)
AST: 20 U/L (ref 15–41)
Albumin: 4.4 g/dL (ref 3.5–5.0)
Alkaline Phosphatase: 58 U/L (ref 38–126)
Anion gap: 13 (ref 5–15)
BUN: 19 mg/dL (ref 8–23)
CO2: 24 mmol/L (ref 22–32)
Calcium: 9.7 mg/dL (ref 8.9–10.3)
Chloride: 107 mmol/L (ref 98–111)
Creatinine, Ser: 0.72 mg/dL (ref 0.44–1.00)
GFR, Estimated: >60 mL/min (ref >60)
Glucose, Bld: 119 mg/dL — ABNORMAL HIGH (ref 70–99)
Potassium: 4.1 mmol/L (ref 3.5–5.1)
Sodium: 144 mmol/L (ref 135–145)
Total Bilirubin: 0.3 mg/dL (ref 0.0–1.2)
Total Protein: 6.8 g/dL (ref 6.5–8.1)

## 2024-07-02 LAB — URINALYSIS, MICROSCOPIC (REFLEX): RBC / HPF: NONE SEEN RBC/hpf (ref 0–5)

## 2024-07-02 MED ORDER — SODIUM CHLORIDE 0.9 % IV BOLUS
500.0000 mL | Freq: Once | INTRAVENOUS | Status: AC
  Administered 2024-07-02: 500 mL via INTRAVENOUS

## 2024-07-02 MED ORDER — SODIUM CHLORIDE 0.9 % IV BOLUS
1000.0000 mL | Freq: Once | INTRAVENOUS | Status: AC
  Administered 2024-07-02: 1000 mL via INTRAVENOUS

## 2024-07-02 NOTE — ED Triage Notes (Signed)
 Pt reports diarrhea since Thurs; chronic issue for pt; sts she feels dehydrated and weak

## 2024-07-02 NOTE — ED Provider Notes (Signed)
 [facility] [facility] Provider Note   CSN: [id number] Arrival date & time: 07/02/24  1735     Patient presents with: Diarrhea   Tami [name] is a 68 y.o. female.   Patient with history of chronic diarrhea, cause being evaluated by gastroenterology, presents with persistent diarrhea and feeling like she is dehydrated today. No fever. She has intermittent abdominal discomfort controlled with Bentyl . No fever, no bloody stools, no vomiting.   The history is provided by the patient. No language interpreter was used.  Diarrhea      Prior to Admission medications   Medication Sig Start Date End Date Taking? Authorizing Provider  acetaminophen  (TYLENOL ) 500 MG tablet Take 500 mg by mouth every 6 (six) hours as needed.    [provider]  acyclovir  (ZOVIRAX ) 400 MG tablet TAKE ONE (1) TABLET BY MOUTH TWO (2) TIMES DAILY 02/08/24   Timmy Maude SAUNDERS, MD  albuterol  (PROVENTIL ) (2.5 MG/3ML) 0.083% nebulizer solution Take 3 mLs (2.5 mg total) by nebulization every 6 (six) hours as needed for wheezing or shortness of breath. 12/14/23   Kara Dorn NOVAK, MD  albuterol  (VENTOLIN  HFA) 108 (90 Base) MCG/ACT inhaler Inhale 2 puffs into the lungs every 6 (six) hours as needed for wheezing or shortness of breath. 12/14/23   Kara Dorn NOVAK, MD  amLODipine  (NORVASC ) 5 MG tablet Take 5 mg by mouth daily. Patient not taking: Reported on 06/13/2024 09/14/23   [provider]  amoxicillin  (AMOXIL ) 500 MG capsule Take 500 mg by mouth 3 (three) times daily. 06/06/24   [provider]  aspirin  EC 81 MG tablet Take 81 mg by mouth daily. Swallow whole.    [provider]  budesonide  (PULMICORT ) 0.5 MG/2ML nebulizer solution Take 2 mLs (0.5 mg total) by nebulization 2 (two) times daily as needed (cough, wheezing or shortness of breath). 12/14/23   Kara Dorn NOVAK, MD  cetirizine  (ZYRTEC ) 10 MG tablet Take 10 mg by mouth at bedtime.    [provider]  Cholecalciferol (VITAMIN D3) 50 MCG (2000 UT) TABS Take 2,000 Units by mouth in the morning.    [provider]  cholestyramine  light (PREVALITE ) 4 g packet Take 4 g by mouth 2 (two) times daily.    [provider]  cyclobenzaprine  (FLEXERIL ) 5 MG tablet Take 1 tablet (5 mg total) by mouth 2 (two) times daily as needed for up to 10 doses for muscle spasms. 01/03/24   Curatolo, Adam, DO  dicyclomine  (BENTYL ) 20 MG tablet Take 1 tablet (20 mg total) by mouth 3 (three) times daily before meals. 06/13/24   May, Deanna J, NP  diphenhydrAMINE  (BENADRYL ) 25 MG tablet Take 25 mg by mouth. 01/28/22   [provider]  estradiol  (ESTRACE ) 0.1 MG/GM vaginal cream Place 0.5 g vaginally 2 (two) times a week. Place 0.5g nightly for two weeks then twice a week after 02/03/24   Guadlupe Dull T, MD  famotidine  (PEPCID ) 20 MG tablet Take 1 tablet (20 mg total) by mouth at bedtime. Patient taking differently: Take 40 mg by mouth at bedtime. 09/16/23   Domenica Harlene DELENA, MD  ferrous sulfate (FEROSUL) 325 (65 FE) MG tablet Take 1 tablet (325 mg total) by mouth daily with breakfast. 01/19/24   Domenica Harlene DELENA, MD  fluconazole  (DIFLUCAN ) 150 MG tablet Take by mouth. 05/13/24   [provider]  fluticasone  (FLONASE ) 50 MCG/ACT nasal spray Place 2 sprays into both nostrils daily. 01/07/20   Joy,  Shawn C, PA-C  furosemide  (LASIX ) 20 MG tablet Take 20 mg by mouth daily as needed (fluid retention (feet swelling)). 07/03/20   [provider]  hydrOXYzine  (ATARAX ) 10 MG tablet TAKE ONE (1) TABLET BY MOUTH 3 TIMES DAILY AS NEEDED FOR ITCHING 03/03/23   Domenica Harlene LABOR, MD  lidocaine  (XYLOCAINE ) 5 % ointment Apply 1 Application topically as needed. 01/31/24   Guadlupe Lianne DASEN, MD  lidocaine -prilocaine  (EMLA ) cream Apply 1 Application topically as needed. 07/01/23   Timmy Maude SAUNDERS, MD  loperamide  (IMODIUM ) 2 MG capsule Take 4 mg by mouth in the morning and at bedtime.    [provider]   losartan  (COZAAR ) 50 MG tablet Take 50 mg by mouth in the morning and at bedtime. 04/14/21   [provider]  Magnesium  Glycinate 100 MG CAPS Take 200 mg by mouth at bedtime. 09/21/22   [provider]  meclizine  (ANTIVERT ) 12.5 MG tablet Take 12.5 mg by mouth 2 (two) times daily as needed for dizziness or nausea. 01/28/22   [provider]  metoprolol  succinate (TOPROL -XL) 50 MG 24 hr tablet Take 1 tablet (50 mg total) by mouth 2 (two) times daily. 02/23/23   Domenica Harlene LABOR, MD  metroNIDAZOLE  (METROGEL ) 0.75 % gel Insert One applicatorful (5 g ) vaginally at bedtime for 5 days 02/28/24   Drubel, Manuelita, PA-C  montelukast  (SINGULAIR ) 10 MG tablet Take 1 tablet (10 mg total) by mouth at bedtime. Please start taking this on September 4. 07/01/23   Timmy Maude SAUNDERS, MD  Multiple Vitamins-Minerals (CENTRUM SILVER  WOMEN 50+ PO) Take 1 tablet by mouth daily at 6 (six) AM.    [provider]  neomycin -polymyxin-hydrocortisone (CORTISPORIN) OTIC solution Place 4 drops into both ears 4 (four) times daily. 05/18/24   Domenica Harlene LABOR, MD  ondansetron  (ZOFRAN ) 8 MG tablet Take 1 tablet (8 mg total) by mouth every 8 (eight) hours as needed for nausea or vomiting. 07/14/23   Timmy Maude SAUNDERS, MD  oxyCODONE -acetaminophen  (PERCOCET) 5-325 MG tablet Take 2 tablets by mouth every 6 (six) hours as needed. 08/07/23   Patt Alm Macho, MD  pantoprazole  (PROTONIX ) 40 MG tablet Take 1 tablet (40 mg total) by mouth daily. Patient not taking: Reported on 06/13/2024 09/03/23   O'Sullivan, Melissa, NP  phenazopyridine  (PYRIDIUM ) 200 MG tablet Take 1 tablet (200 mg total) by mouth 3 (three) times daily as needed for pain. 01/31/24   Guadlupe Lianne DASEN, MD  potassium chloride  SA (KLOR-CON  M) 20 MEQ tablet TAKE 1 TABLET BY MOUTH EVERY MONDAY, WEDNESDAY, AND FRIDAY. 10/25/23   Domenica Harlene LABOR, MD  prochlorperazine  (COMPAZINE ) 10 MG tablet Take 0.5 tablets (5 mg total) by mouth 2 (two) times daily as needed for  nausea or vomiting. 12/02/23   Domenica Harlene LABOR, MD  Respiratory Therapy Supplies (PILLOW MASK/ADULT) MISC  09/29/23   [provider]  scopolamine  (TRANSDERM-SCOP) 1 MG/3DAYS Place 1 patch (1.5 mg total) onto the skin every 3 (three) days. 07/01/23   Timmy Maude SAUNDERS, MD  sertraline  (ZOLOFT ) 50 MG tablet Take 1 tablet (50 mg total) by mouth daily. Patient not taking: Reported on 06/13/2024 06/14/23   Domenica Harlene LABOR, MD  spironolactone  (ALDACTONE ) 25 MG tablet Take 25 mg by mouth daily.    [provider]  sucralfate  (CARAFATE ) 1 GM/10ML suspension Take 10 mLs (1 g total) by mouth 4 (four) times daily -  with meals and at bedtime. 05/18/24   Domenica Harlene LABOR, MD  sulfamethoxazole -trimethoprim  (BACTRIM  DS)  800-160 MG tablet Take 1 tablet by mouth 2 (two) times daily. 05/17/24   [provider]  Vibegron  (GEMTESA ) 75 MG TABS Take 1 tablet (75 mg total) by mouth daily. 06/15/24   Guadlupe Lianne DASEN, MD    Allergies: Benazepril , Ondansetron  hcl, Codeine, and Morphine    Review of Systems  Gastrointestinal:  Positive for diarrhea.    Updated Vital Signs BP (!) 174/98   Pulse 81   Temp 98.2 F (36.8 C) (Oral)   Resp 18   Ht 5' 4 (1.626 m)   Wt 86.6 kg   SpO2 100%   BMI 32.79 kg/m   Physical Exam Vitals and nursing note reviewed.  Constitutional:      Appearance: She is well-developed.  HENT:     Head: Normocephalic.     Mouth/Throat:     Mouth: Mucous membranes are dry.  Cardiovascular:     Rate and Rhythm: Normal rate and regular rhythm.     Heart sounds: No murmur heard. Pulmonary:     Effort: Pulmonary effort is normal.     Breath sounds: Normal breath sounds. No wheezing, rhonchi or rales.  Abdominal:     General: There is no distension.     Palpations: Abdomen is soft.     Tenderness: There is no abdominal tenderness. There is no guarding or rebound.  Musculoskeletal:        General: Normal range of motion.     Cervical back: Normal range of motion and neck  supple.  Skin:    General: Skin is warm and dry.  Neurological:     General: No focal deficit present.     Mental Status: She is alert and oriented to person, place, and time.     (all labs ordered are listed, but only abnormal results are displayed) Labs Reviewed  COMPREHENSIVE METABOLIC PANEL WITH GFR - Abnormal; Notable for the following components:      Result Value   Glucose, Bld 119 (*)    All other components within normal limits  URINALYSIS, ROUTINE W REFLEX MICROSCOPIC - Abnormal; Notable for the following components:   Nitrite POSITIVE (*)    Leukocytes,Ua SMALL (*)    All other components within normal limits  URINALYSIS, MICROSCOPIC (REFLEX) - Abnormal; Notable for the following components:   Bacteria, UA FEW (*)    All other components within normal limits  CBC WITH DIFFERENTIAL/PLATELET    EKG: None  Radiology: No results found.   Procedures   Medications Ordered in the ED  sodium chloride  0.9 % bolus 1,000 mL (0 mLs Intravenous Stopped 07/02/24 2140)  sodium chloride  0.9 % bolus 500 mL (0 mLs Intravenous Stopped 07/02/24 2212)    Clinical Course as of 07/02/24 2258  Sun Jul 02, 2024  2252 Patient to ED feeling generally weak like when she gets dehydrated from condition of chronic diarrhea. No change in her bowel movements. No fever, no vomiting. She is being followed by GI and is scheduled with GI tomorrow for EGD. IV fluids provided and she feels much better afterwards. She states she is ready for discharge home. Discussed nitrite positive urine but she is without symptoms of UTI and interference of antibiotics with her EGD was considered. Urine cultures added and will be reviewed with her primary care later this week. If needed abx to be started at that time.  [SU]    Clinical Course User Index [SU] Odell Balls, PA-C  Medical Decision Making Amount and/or Complexity of Data Reviewed Labs: ordered.        Final  diagnoses:  Chronic diarrhea    ED Discharge Orders     None          Odell Margit RIGGERS 07/02/24 2258    Patsey Lot, MD 07/02/24 2300

## 2024-07-02 NOTE — Discharge Instructions (Signed)
 Keep your scheduled appointment with gastroenterology tomorrow.

## 2024-07-03 ENCOUNTER — Encounter (HOSPITAL_COMMUNITY): Payer: Self-pay | Admitting: Gastroenterology

## 2024-07-03 ENCOUNTER — Ambulatory Visit (HOSPITAL_BASED_OUTPATIENT_CLINIC_OR_DEPARTMENT_OTHER): Admitting: Certified Registered Nurse Anesthetist

## 2024-07-03 ENCOUNTER — Other Ambulatory Visit: Payer: Self-pay

## 2024-07-03 ENCOUNTER — Ambulatory Visit (HOSPITAL_COMMUNITY)
Admission: RE | Admit: 2024-07-03 | Discharge: 2024-07-03 | Disposition: A | Discharge status: Home / Self Care | Attending: Gastroenterology | Admitting: Gastroenterology

## 2024-07-03 ENCOUNTER — Encounter (HOSPITAL_COMMUNITY): Admitting: Certified Registered Nurse Anesthetist

## 2024-07-03 ENCOUNTER — Telehealth: Payer: Self-pay | Admitting: Family Medicine

## 2024-07-03 ENCOUNTER — Encounter (HOSPITAL_COMMUNITY)
Admission: RE | Disposition: A | Discharge status: Home / Self Care | Payer: Self-pay | Source: Home / Self Care | Attending: Gastroenterology

## 2024-07-03 DIAGNOSIS — R519 Headache, unspecified: Secondary | ICD-10-CM | POA: Diagnosis not present

## 2024-07-03 DIAGNOSIS — D649 Anemia, unspecified: Secondary | ICD-10-CM | POA: Insufficient documentation

## 2024-07-03 DIAGNOSIS — J45909 Unspecified asthma, uncomplicated: Secondary | ICD-10-CM | POA: Diagnosis not present

## 2024-07-03 DIAGNOSIS — N10 Acute tubulo-interstitial nephritis: Secondary | ICD-10-CM | POA: Diagnosis not present

## 2024-07-03 DIAGNOSIS — Z8673 Personal history of transient ischemic attack (TIA), and cerebral infarction without residual deficits: Secondary | ICD-10-CM | POA: Insufficient documentation

## 2024-07-03 DIAGNOSIS — K2980 Duodenitis without bleeding: Secondary | ICD-10-CM | POA: Insufficient documentation

## 2024-07-03 DIAGNOSIS — Z79899 Other long term (current) drug therapy: Secondary | ICD-10-CM | POA: Insufficient documentation

## 2024-07-03 DIAGNOSIS — I5040 Unspecified combined systolic (congestive) and diastolic (congestive) heart failure: Secondary | ICD-10-CM | POA: Diagnosis not present

## 2024-07-03 DIAGNOSIS — C9000 Multiple myeloma not having achieved remission: Secondary | ICD-10-CM | POA: Insufficient documentation

## 2024-07-03 DIAGNOSIS — R112 Nausea with vomiting, unspecified: Secondary | ICD-10-CM | POA: Insufficient documentation

## 2024-07-03 DIAGNOSIS — K760 Fatty (change of) liver, not elsewhere classified: Secondary | ICD-10-CM | POA: Insufficient documentation

## 2024-07-03 DIAGNOSIS — E785 Hyperlipidemia, unspecified: Secondary | ICD-10-CM | POA: Diagnosis not present

## 2024-07-03 DIAGNOSIS — I5041 Acute combined systolic (congestive) and diastolic (congestive) heart failure: Secondary | ICD-10-CM | POA: Diagnosis not present

## 2024-07-03 DIAGNOSIS — M1990 Unspecified osteoarthritis, unspecified site: Secondary | ICD-10-CM | POA: Diagnosis not present

## 2024-07-03 DIAGNOSIS — R300 Dysuria: Secondary | ICD-10-CM | POA: Diagnosis not present

## 2024-07-03 DIAGNOSIS — Z7951 Long term (current) use of inhaled steroids: Secondary | ICD-10-CM | POA: Insufficient documentation

## 2024-07-03 DIAGNOSIS — I110 Hypertensive heart disease with heart failure: Secondary | ICD-10-CM | POA: Diagnosis not present

## 2024-07-03 DIAGNOSIS — R1013 Epigastric pain: Secondary | ICD-10-CM

## 2024-07-03 DIAGNOSIS — Z79624 Long term (current) use of inhibitors of nucleotide synthesis: Secondary | ICD-10-CM | POA: Insufficient documentation

## 2024-07-03 DIAGNOSIS — K2960 Other gastritis without bleeding: Secondary | ICD-10-CM

## 2024-07-03 DIAGNOSIS — G4733 Obstructive sleep apnea (adult) (pediatric): Secondary | ICD-10-CM | POA: Diagnosis not present

## 2024-07-03 DIAGNOSIS — R1110 Vomiting, unspecified: Secondary | ICD-10-CM

## 2024-07-03 DIAGNOSIS — Z7982 Long term (current) use of aspirin: Secondary | ICD-10-CM | POA: Insufficient documentation

## 2024-07-03 DIAGNOSIS — K2100 Gastro-esophageal reflux disease with esophagitis, without bleeding: Secondary | ICD-10-CM | POA: Diagnosis not present

## 2024-07-03 DIAGNOSIS — K529 Noninfective gastroenteritis and colitis, unspecified: Secondary | ICD-10-CM | POA: Diagnosis not present

## 2024-07-03 HISTORY — PX: ESOPHAGOGASTRODUODENOSCOPY: SHX5428

## 2024-07-03 HISTORY — DX: Transient cerebral ischemic attack, unspecified: G45.9

## 2024-07-03 SURGERY — EGD (ESOPHAGOGASTRODUODENOSCOPY)
Anesthesia: Monitor Anesthesia Care

## 2024-07-03 MED ORDER — PROPOFOL 500 MG/50ML IV EMUL
INTRAVENOUS | Status: DC | PRN
  Administered 2024-07-03: 150 ug/kg/min via INTRAVENOUS

## 2024-07-03 MED ORDER — LIDOCAINE 2% (20 MG/ML) 5 ML SYRINGE
INTRAMUSCULAR | Status: DC | PRN
  Administered 2024-07-03: 50 mg via INTRAVENOUS

## 2024-07-03 MED ORDER — PROPOFOL 10 MG/ML IV BOLUS
INTRAVENOUS | Status: DC | PRN
  Administered 2024-07-03: 30 mg via INTRAVENOUS
  Administered 2024-07-03: 20 mg via INTRAVENOUS

## 2024-07-03 MED ORDER — PROPOFOL 500 MG/50ML IV EMUL
INTRAVENOUS | Status: AC
Start: 2024-07-03 — End: 2024-07-03
  Filled 2024-07-03: qty 50

## 2024-07-03 MED ORDER — SODIUM CHLORIDE 0.9 % IV SOLN
INTRAVENOUS | Status: AC | PRN
  Administered 2024-07-03: 500 mL via INTRAMUSCULAR

## 2024-07-03 MED ORDER — CHOLESTYRAMINE 4 G PO PACK: 1.0000 | PACK | Freq: Every day | ORAL | 11 refills | Status: AC

## 2024-07-03 NOTE — Telephone Encounter (Signed)
 Pt scheduled for tomorrow with [facility].

## 2024-07-03 NOTE — Anesthesia Postprocedure Evaluation (Signed)
 Anesthesia Post Note  Patient: Tami [name]  Procedure(s) Performed: EGD (ESOPHAGOGASTRODUODENOSCOPY)     Patient location during evaluation: PACU Anesthesia Type: MAC Level of consciousness: awake and alert Pain management: pain level controlled Vital Signs Assessment: post-procedure vital signs reviewed and stable Respiratory status: spontaneous breathing, nonlabored ventilation, respiratory function stable and patient connected to nasal cannula oxygen Cardiovascular status: stable and blood pressure returned to baseline Postop Assessment: no apparent nausea or vomiting Anesthetic complications: no   No notable events documented.  Last Vitals:  Vitals:   07/03/24 0940 07/03/24 0947  BP: (!) 149/83 (!) 163/83  Pulse: (!) 58 64  Resp: 18 13  Temp:    SpO2: 95% 97%    Last Pain:  Vitals:   07/03/24 0947  TempSrc:   PainSc: 0-No pain                 Epifanio Lamar BRAVO

## 2024-07-03 NOTE — Anesthesia Preprocedure Evaluation (Signed)
 Anesthesia Evaluation  Patient identified by MRN, date of birth, ID band Patient awake    Reviewed: Allergy & Precautions, NPO status , Patient's Chart, lab work & pertinent test results  Airway Mallampati: II  TM Distance: >3 FB Neck ROM: Full    Dental  (+) Dental Advisory Given   Pulmonary asthma , sleep apnea    breath sounds clear to auscultation       Cardiovascular hypertension, Pt. on medications and Pt. on home beta blockers +CHF  + pacemaker  Rhythm:Regular Rate:Normal     Neuro/Psych  Headaches  Neuromuscular disease CVA    GI/Hepatic Neg liver ROS,GERD  ,,  Endo/Other  negative endocrine ROS    Renal/GU negative Renal ROS     Musculoskeletal  (+) Arthritis ,    Abdominal   Peds  Hematology  (+) Blood dyscrasia, anemia   Anesthesia Other Findings   Reproductive/Obstetrics                              Anesthesia Physical Anesthesia Plan  ASA: 3  Anesthesia Plan: MAC   Post-op Pain Management:    Induction:   PONV Risk Score and Plan: 2 and Propofol  infusion  Airway Management Planned: Natural Airway and Nasal Cannula  Additional Equipment:   Intra-op Plan:   Post-operative Plan:   Informed Consent: I have reviewed the patients History and Physical, chart, labs and discussed the procedure including the risks, benefits and alternatives for the proposed anesthesia with the patient or authorized representative who has indicated his/her understanding and acceptance.       Plan Discussed with:   Anesthesia Plan Comments:         Anesthesia Quick Evaluation

## 2024-07-03 NOTE — Telephone Encounter (Signed)
 Copied from CRM #[id number]. Topic: Appointments - Scheduling Inquiry for Clinic >> Jul 03, 2024 12:26 PM Ismael A wrote: Reason for CRM: patient stated she has UTI and is experiencing pain due to it, refused Nurse Triage wanting to get scheduled asap, requested tomorrow

## 2024-07-03 NOTE — Discharge Instructions (Signed)
 Diaphragmatic Breathing Exercise:

## 2024-07-03 NOTE — Transfer of Care (Signed)
 Immediate Anesthesia Transfer of Care Note  Patient: Tami [name]  Procedure(s) Performed: EGD (ESOPHAGOGASTRODUODENOSCOPY)  Patient Location: Endoscopy Unit  Anesthesia Type:MAC  Level of Consciousness: awake and patient cooperative  Airway & Oxygen Therapy: Patient Spontanous Breathing and Patient connected to nasal cannula oxygen  Post-op Assessment: Report given to RN and Post -op Vital signs reviewed and stable  Post vital signs: Reviewed and stable  Last Vitals:  Vitals Value Taken Time  BP    Temp    Pulse 71 07/03/24 09:14  Resp 17 07/03/24 09:14  SpO2 99 % 07/03/24 09:14  Vitals shown include unfiled device data.  Last Pain:  Vitals:   07/03/24 0825  TempSrc: Temporal  PainSc: 9       Patients Stated Pain Goal: 0 (07/03/24 0825)  Complications: No notable events documented.

## 2024-07-03 NOTE — Op Note (Signed)
 [facility] Patient Name: Tami [name] Procedure Date: 07/03/2024 MRN: [id number] Attending MD: Sandor Flatter , MD, [id number] Date of Birth: 1956-04-17 CSN: [id number] Age: 68 Admit Type: Outpatient Procedure:                Upper GI endoscopy Indications:              Epigastric abdominal pain, Nausea with vomiting,                            Diarrhea                           68 year old female with persistent epigastric pain                            and nausea and vomiting/regurgitation, along with                            chronic diarrhea. Has had an extensive evaluation                            to include multiple CT scans, mesenteric duplex, CT                            angiography, prior upper endoscopy and colonoscopy,                            CT head, along with extensive serologic evaluation                            which have been largely unrevealing. Currently                            taking pantoprazole , Pepcid , Compazine , along with                            on-demand Lomotil  and Bentyl . Providers:                Sandor Flatter, MD, Almarie Masters, RN, Felice Sar, Technician Referring MD:             Lynnie Bring, MD, Cathryne PARAS. May Medicines:                Monitored Anesthesia Care Complications:            No immediate complications. Estimated Blood Loss:     Estimated blood loss was minimal. Procedure:                Pre-Anesthesia Assessment:                           - Prior to the procedure, a History and Physical                            was performed, and patient medications and  allergies were reviewed. The patient's tolerance of                            previous anesthesia was also reviewed. The risks                            and benefits of the procedure and the sedation                            options and risks were discussed with the patient.                             All questions were answered, and informed consent                            was obtained. Prior Anticoagulants: The patient has                            taken no anticoagulant or antiplatelet agents. ASA                            Grade Assessment: III - A patient with severe                            systemic disease. After reviewing the risks and                            benefits, the patient was deemed in satisfactory                            condition to undergo the procedure.                           After obtaining informed consent, the endoscope was                            passed under direct vision. Throughout the                            procedure, the patient's blood pressure, pulse, and                            oxygen saturations were monitored continuously. The                            [id number]-H190 ([id number]) Olympus endoscope was introduced                            through the mouth, and advanced to the third part                            of duodenum. The upper GI endoscopy was  accomplished without difficulty. The patient                            tolerated the procedure well. Scope In: Scope Out: Findings:      The examined esophagus was normal.      The Z-line was regular and was found 37 cm from the incisors.      The entire examined stomach was normal. Pylorus was patent and easily       traversed. Biopsies were taken with a cold forceps for histology and       Helicobacter pylori testing. Estimated blood loss was minimal.      The gastroesophageal flap valve was visualized endoscopically and       classified as Hill Grade I (prominent fold, tight to endoscope).      The examined duodenum was normal. Biopsies were taken with a cold       forceps for histology. Estimated blood loss was minimal. Impression:               - Normal esophagus.                           - Z-line regular, 37 cm from the incisors.                            - Normal stomach. Biopsied.                           - Gastroesophageal flap valve classified as Hill                            Grade I (prominent fold, tight to endoscope).                           - Normal examined duodenum. Biopsied.                           - Made several passes through the esophagus,                            stomach, and proximal small bowel. No areas of                            inflammation or obstruction. Pylorus is widely                            patent and easily traversable. Moderate Sedation:      Not Applicable - Patient had care per Anesthesia. Recommendation:           - Patient has a contact number available for                            emergencies. The signs and symptoms of potential                            delayed complications were discussed with the  patient. Return to normal activities tomorrow.                            Written discharge instructions were provided to the                            patient.                           - Resume previous diet.                           - Continue present medications.                           - Await pathology results.                           - Per patient request, results discussed with                            patient's DPOA, Tami [name], by phone.                           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 Will discuss with                            her primary Gastroenterologist, Dr. Charlanne, re:                            possible referral for biofeedback. Will discharge                            with instructions for diaphragmatic breathing                             exercises in the interim.                           - Return to GI clinic at appointment to be                            scheduled. Procedure Code(s):        --- Professional ---  56760, Esophagogastroduodenoscopy, flexible,                            transoral; with biopsy, single or multiple Diagnosis Code(s):        --- Professional ---                           R10.13, Epigastric pain                           R11.2, Nausea with vomiting, unspecified CPT copyright 2022 American Medical Association. All rights reserved. The codes documented in this report are preliminary and upon coder review may  be revised to meet current compliance requirements. Sandor Flatter, MD 07/03/2024 9:21:34 AM Number of Addenda: 0

## 2024-07-03 NOTE — Anesthesia Procedure Notes (Signed)
 Procedure Name: MAC Date/Time: 07/03/2024 8:55 AM  Performed by: Judythe Tanda Aran, CRNAPre-anesthesia Checklist: Patient identified, Emergency Drugs available, Suction available and Patient being monitored Patient Re-evaluated:Patient Re-evaluated prior to induction Oxygen Delivery Method: Simple face mask

## 2024-07-03 NOTE — Interval H&P Note (Signed)
 History and Physical Interval Note:  Plan to proceed with EGD today for continued evaluation of epigastric pain and nausea/vomiting.  Procedure scheduled at [facility] Endoscopy unit due to elevated periprocedural risks from underlying comorbidities, to include history of CHF with reduced EF (ICD in place), CVA, OSA, multiple myeloma.  Has had epigastric pain and postprandial nausea for quite some time.  While she has had some episodes of forceful emesis, seems like the vast majority of vomiting is actually effortless regurgitation and able to spit into a cup that she keeps at her bedside/chair side.  Some improvement with Compazine .  Continues to take Protonix  and Pepcid  as prescribed.  Will evaluate for mucosal/luminal pathology EGD today.  07/03/2024 8:53 AM  Tami [name]  has presented today for surgery, with the diagnosis of nausea, vomiting, epigastric pain, poor appetite.  The various methods of treatment have been discussed with the patient and family. After consideration of risks, benefits and other options for treatment, the patient has consented to  Procedure(s): EGD (ESOPHAGOGASTRODUODENOSCOPY) (N/A) as a surgical intervention.  The patient's history has been reviewed, patient examined, no change in status, stable for surgery.  I have reviewed the patient's chart and labs.  Questions were answered to the patient's satisfaction.     Sandor GAILS Tawnya Pujol

## 2024-07-04 ENCOUNTER — Encounter (HOSPITAL_COMMUNITY): Payer: Self-pay | Admitting: Gastroenterology

## 2024-07-04 ENCOUNTER — Ambulatory Visit (INDEPENDENT_AMBULATORY_CARE_PROVIDER_SITE_OTHER): Admitting: Student

## 2024-07-04 ENCOUNTER — Telehealth: Payer: Self-pay

## 2024-07-04 ENCOUNTER — Ambulatory Visit: Payer: Self-pay | Admitting: Gastroenterology

## 2024-07-04 DIAGNOSIS — R300 Dysuria: Secondary | ICD-10-CM | POA: Diagnosis not present

## 2024-07-04 LAB — POCT URINALYSIS DIP (MANUAL ENTRY)
Bilirubin, UA: NEGATIVE
Blood, UA: NEGATIVE
Glucose, UA: NEGATIVE mg/dL
Ketones, POC UA: NEGATIVE mg/dL
Nitrite, UA: NEGATIVE
Protein Ur, POC: NEGATIVE mg/dL
Spec Grav, UA: 1.015 (ref 1.010–1.025)
Urobilinogen, UA: 0.2 U/dL
pH, UA: 6 (ref 5.0–8.0)

## 2024-07-04 LAB — SURGICAL PATHOLOGY

## 2024-07-04 MED ORDER — PHENAZOPYRIDINE HCL 100 MG PO TABS: 100.0000 mg | ORAL_TABLET | Freq: Three times a day (TID) | ORAL | 0 refills | Status: DC | PRN

## 2024-07-04 MED ORDER — NITROFURANTOIN MONOHYD MACRO 100 MG PO CAPS: 100.0000 mg | ORAL_CAPSULE | Freq: Two times a day (BID) | ORAL | 0 refills | Status: AC

## 2024-07-04 NOTE — Telephone Encounter (Signed)
 Per 8/25 EGD procedure report - follow up in office at appt to be scheduled  Patient has been scheduled for a follow up appt with Dr. Charlanne on 07/27/24 at 1:50 pm. MyChart message to patient with appointment information.

## 2024-07-04 NOTE — Progress Notes (Signed)
 Chief Complaint  Patient presents with   Urinary Frequency   Dysuria    Tami [name] is a 68 y.o. female here for possible UTI. She recently had endoscopic procedure and had UA performed showing +bacteria, +leuks, +nitrites on 07/02/2024. She has not had any abx.    Duration: 6 days. Symptoms: Dysuria, urinary frequency, foul smelling urine Denies: hematuria, fever, nausea, and vomiting, vaginal discharge Hx of recurrent UTI? Yes Denies new sexual partners.   Past Medical History:  Diagnosis Date   Abnormal SPEP 07/26/2016   Arthritis    knees, hands   Back pain 07/14/2016   Bowel obstruction (HCC) 11/2014   C. 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   Multiple myeloma (HCC) 07/20/2018   Multiple myeloma not having achieved remission (HCC) 07/20/2018   Myalgia 12/31/2016   Obstructive sleep apnea 03/31/2015   Presence of permanent cardiac pacemaker    SOB (shortness of breath) 04/09/2016   Stroke (HCC)    TIAs   TIA (transient ischemic attack)    UTI (urinary tract infection)      BP 130/80 (BP Location: Left Arm, Patient Position: Sitting, Cuff Size: Normal)   Pulse 77   Temp 98.3 F (36.8 C) (Oral)   Resp 12   SpO2 97%  General: Awake, alert, appears stated age Heart: RRR Lungs: CTAB, normal respiratory effort, no accessory muscle usage Abd: BS+, soft, NT, ND, no masses or organomegaly MSK: No CVA tenderness,  Psych: Age appropriate judgment and insight  Dysuria - Plan: phenazopyridine  (PYRIDIUM ) 100 MG  tablet, nitrofurantoin , macrocrystal-monohydrate, (MACROBID ) 100 MG capsule, POCT urinalysis dipstick, Urine Culture  Stay hydrated. UA- +moderate Leuks. Urine Culture pending. Rx- Macrobid  abx and pyridium  prn Seek immediate care if pt starts to develop fevers, new/worsening symptoms, uncontrollable N/V. F/u prn. The patient voiced understanding and agreement to the plan.  [name], DNP, AGNP-C 07/04/24 5:08 PM

## 2024-07-06 ENCOUNTER — Ambulatory Visit: Payer: Self-pay | Admitting: Student

## 2024-07-06 LAB — URINE CULTURE
MICRO NUMBER:: 16884461
SPECIMEN QUALITY:: ADEQUATE

## 2024-07-10 DIAGNOSIS — R531 Weakness: Secondary | ICD-10-CM | POA: Diagnosis not present

## 2024-07-10 DIAGNOSIS — E860 Dehydration: Secondary | ICD-10-CM | POA: Diagnosis not present

## 2024-07-10 DIAGNOSIS — R197 Diarrhea, unspecified: Secondary | ICD-10-CM | POA: Diagnosis not present

## 2024-07-12 DIAGNOSIS — G4733 Obstructive sleep apnea (adult) (pediatric): Secondary | ICD-10-CM | POA: Diagnosis not present

## 2024-07-13 ENCOUNTER — Encounter: Payer: Self-pay | Admitting: Hematology & Oncology

## 2024-07-14 ENCOUNTER — Inpatient Hospital Stay: Attending: Hematology & Oncology

## 2024-07-14 VITALS — BP 159/81 | HR 64 | Temp 98.0°F | Resp 20

## 2024-07-14 DIAGNOSIS — C9000 Multiple myeloma not having achieved remission: Secondary | ICD-10-CM | POA: Insufficient documentation

## 2024-07-14 DIAGNOSIS — R109 Unspecified abdominal pain: Secondary | ICD-10-CM | POA: Diagnosis not present

## 2024-07-14 DIAGNOSIS — R197 Diarrhea, unspecified: Secondary | ICD-10-CM | POA: Insufficient documentation

## 2024-07-14 MED ORDER — SODIUM CHLORIDE 0.9 % IV SOLN: INTRAVENOUS | Status: DC

## 2024-07-14 NOTE — Patient Instructions (Signed)
 Dehydration, Adult Dehydration is a condition in which there is not enough water or other fluids in the body. This happens when a person loses more fluids than they take in. Important organs cannot work right without the right amount of fluids. Any loss of fluids from the body can cause dehydration. Dehydration can be mild, worse, or very bad. It should be treated right away to keep it from getting very bad. What are the causes? Conditions that cause loss of water in the body. They include: Watery poop (diarrhea). Vomiting. Sweating a lot. Fever. Infection. Peeing (urinating) a lot. Not drinking enough fluids. Certain medicines, such as medicines that take extra fluid out of the body (diuretics). Lack of safe drinking water. Not being able to get enough water and food. What increases the risk? Having a long-term (chronic) illness that has not been treated the right way, such as: Diabetes. Heart disease. Kidney disease. Being 25 years of age or older. Having a disability. Living in a place that is high above the ground or sea (high in altitude). The thinner, drier air causes more fluid loss. Doing exercises that put stress on your body for a long time. Being active when in hot places. What are the signs or symptoms? Symptoms of dehydration depend on how bad it is. Mild or worse dehydration Thirst. Dry lips or dry mouth. Feeling dizzy or light-headed. Muscle cramps. Passing little pee or dark pee. Pee may be the color of tea. Headache. Very bad dehydration Changes in skin. Skin may: Be cold to the touch (clammy). Be blotchy or pale. Not go back to normal right after you pinch it and let it go. Little or no tears, pee, or sweat. Fast breathing. Low blood pressure. Weak pulse. Pulse that is more than 100 beats a minute when you are sitting still. Other changes, such as: Feeling very thirsty. Eyes that look hollow (sunken). Cold hands and feet. Being confused. Being very  tired (lethargic) or having trouble waking from sleep. Losing weight. Loss of consciousness. How is this treated? Treatment for this condition depends on how bad your dehydration is. Treatment should start right away. Do not wait until your condition gets very bad. Very bad dehydration is an emergency. You will need to go to a hospital. Mild or worse dehydration can be treated at home. You may be asked to: Drink more fluids. Drink an oral rehydration solution (ORS). This drink gives you the right amount of fluids, salts, and minerals (electrolytes). Very bad dehydration can be treated: With fluids through an IV tube. By correcting low levels of electrolytes in the body. By treating the problem that caused your dehydration. Follow these instructions at home: Oral rehydration solution If told by your doctor, drink an ORS: Make an ORS. Use instructions on the package. Start by drinking small amounts, about  cup (120 mL) every 5-10 minutes. Slowly drink more until you have had the amount that your doctor said to have.  Eating and drinking  Drink enough clear fluid to keep your pee pale yellow. If you were told to drink an ORS, finish the ORS first. Then, start slowly drinking other clear fluids. Drink fluids such as: Water. Do not drink only water. Doing that can make the salt (sodium) level in your body get too low. Water from ice chips you suck on. Fruit juice that you have added water to (diluted). Low-calorie sports drinks. Eat foods that have the right amounts of salts and minerals, such as bananas, oranges, potatoes,  tomatoes, or spinach. Do not drink alcohol. Avoid drinks that have caffeine or sugar. These include:: High-calorie sports drinks. Fruit juice that you did not add water to. Soda. Coffee or energy drinks. Avoid foods that are greasy or have a lot of fat or sugar. General instructions Take over-the-counter and prescription medicines only as told by your doctor. Do  not take sodium tablets. Doing that can make the salt level in your body get too high. Return to your normal activities as told by your doctor. Ask your doctor what activities are safe for you. Keep all follow-up visits. Your doctor may check and change your treatment. Contact a doctor if: You have pain in your belly (abdomen) and the pain: Gets worse. Stays in one place. You have a rash. You have a stiff neck. You get angry or annoyed more easily than normal. You are more tired or have a harder time waking than normal. You feel weak or dizzy. You feel very thirsty. Get help right away if: You have any symptoms of very bad dehydration. You vomit every time you eat or drink. Your vomiting gets worse, does not go away, or you vomit blood or green stuff. You are getting treatment, but symptoms are getting worse. You have a fever. You have a very bad headache. You have: Diarrhea that gets worse or does not go away. Blood in your poop (stool). This may cause poop to look black and tarry. No pee in 6-8 hours. Only a small amount of pee in 6-8 hours, and the pee is very dark. You have trouble breathing. These symptoms may be an emergency. Get help right away. Call 911. Do not wait to see if the symptoms will go away. Do not drive yourself to the hospital. This information is not intended to replace advice given to you by your health care provider. Make sure you discuss any questions you have with your health care provider. Document Revised: 05/25/2022 Document Reviewed: 05/25/2022 Elsevier Patient Education  2024 [employer].

## 2024-07-18 DIAGNOSIS — R197 Diarrhea, unspecified: Secondary | ICD-10-CM | POA: Diagnosis not present

## 2024-07-18 DIAGNOSIS — N390 Urinary tract infection, site not specified: Secondary | ICD-10-CM | POA: Diagnosis not present

## 2024-07-18 DIAGNOSIS — R112 Nausea with vomiting, unspecified: Secondary | ICD-10-CM | POA: Diagnosis not present

## 2024-07-18 DIAGNOSIS — I10 Essential (primary) hypertension: Secondary | ICD-10-CM | POA: Diagnosis not present

## 2024-07-18 DIAGNOSIS — R531 Weakness: Secondary | ICD-10-CM | POA: Diagnosis not present

## 2024-07-20 ENCOUNTER — Ambulatory Visit: Payer: Self-pay

## 2024-07-20 NOTE — Telephone Encounter (Signed)
 FYI Only or Action Required?: Action required by provider: update on patient condition.  Patient was last seen in primary care on 07/04/2024 by Wheeler Harlene CROME, NP.  Called Nurse Triage reporting Abdominal Pain.  Symptoms began a week ago.  Interventions attempted: Prescription medications: currently taking ABX for UTI.  Symptoms are: gradually worsening.  Triage Disposition: See Physician Within 24 Hours  Patient/caregiver understands and will follow disposition?: Unsure- No appts avail at PCP office within 24 hours. RN offered visit for tomorrow at another office. Pt declined, adamant about waiting to be seen on Monday at [facility]. Will route to office [facility] for follow-up and scheduling  Copied from CRM #[id number]. Topic: Clinical - Red Word Triage >> Jul 20, 2024  3:59 PM Sasha M wrote: Red Word that prompted transfer to Nurse Triage: pain   Reason for Disposition  [1] MODERATE pain (e.g., interferes with normal activities) AND [2] pain comes and goes (cramps) AND [3] present > 24 hours  (Exception: Pain with Vomiting or Diarrhea - see that Guideline.)  Answer Assessment - Initial Assessment Questions 1. LOCATION: Where does it hurt?      Upper stomach, near breast line  2. RADIATION: Does the pain shoot anywhere else? (e.g., chest, back)     No  3. ONSET: When did the pain begin? (e.g., minutes, hours or days ago)      Off/on for the past  week getting worse over the past 3 days  4. SUDDEN: Gradual or sudden onset?     Sud dent  5. PATTERN Does the pain come and go, or is it constant?     Comes and goes  6. SEVERITY: How bad is the pain?  (e.g., Scale 1-10; mild, moderate, or severe)     7/10 pain  7. RECURRENT SYMPTOM: Have you ever had this type of stomach pain before? If Yes, ask: When was the last time? and What happened that time?      Happened earlier this went, patient went to ED and treated for UTI and dehydration  8. CAUSE: What do you think  is causing the stomach pain? (e.g., gallstones, recent abdominal surgery)     Concerned about Cdiff  9. RELIEVING/AGGRAVATING FACTORS: What makes it better or worse? (e.g., antacids, bending or twisting motion, bowel movement)     No  10. OTHER SYMPTOMS: Do you have any other symptoms? (e.g., back pain, diarrhea, fever, urination pain, vomiting)       Diarrhea  11. PREGNANCY: Is there any chance you are pregnant? When was your last menstrual period?       no  Protocols used: Abdominal Pain - [facility]

## 2024-07-21 ENCOUNTER — Inpatient Hospital Stay

## 2024-07-21 ENCOUNTER — Encounter: Payer: Self-pay | Admitting: Hematology & Oncology

## 2024-07-21 ENCOUNTER — Inpatient Hospital Stay (HOSPITAL_BASED_OUTPATIENT_CLINIC_OR_DEPARTMENT_OTHER): Admitting: Hematology & Oncology

## 2024-07-21 VITALS — BP 153/69 | HR 68 | Resp 18

## 2024-07-21 VITALS — BP 153/93 | HR 78 | Temp 98.4°F | Resp 20 | Ht 64.0 in | Wt 192.2 lb

## 2024-07-21 DIAGNOSIS — D500 Iron deficiency anemia secondary to blood loss (chronic): Secondary | ICD-10-CM

## 2024-07-21 DIAGNOSIS — R197 Diarrhea, unspecified: Secondary | ICD-10-CM | POA: Diagnosis not present

## 2024-07-21 DIAGNOSIS — C9000 Multiple myeloma not having achieved remission: Secondary | ICD-10-CM | POA: Diagnosis not present

## 2024-07-21 DIAGNOSIS — I509 Heart failure, unspecified: Secondary | ICD-10-CM | POA: Diagnosis not present

## 2024-07-21 DIAGNOSIS — N390 Urinary tract infection, site not specified: Secondary | ICD-10-CM | POA: Diagnosis not present

## 2024-07-21 DIAGNOSIS — A499 Bacterial infection, unspecified: Secondary | ICD-10-CM | POA: Diagnosis not present

## 2024-07-21 DIAGNOSIS — A0472 Enterocolitis due to Clostridium difficile, not specified as recurrent: Secondary | ICD-10-CM

## 2024-07-21 DIAGNOSIS — E860 Dehydration: Secondary | ICD-10-CM

## 2024-07-21 DIAGNOSIS — R300 Dysuria: Secondary | ICD-10-CM | POA: Diagnosis not present

## 2024-07-21 DIAGNOSIS — R109 Unspecified abdominal pain: Secondary | ICD-10-CM | POA: Diagnosis not present

## 2024-07-21 LAB — CBC WITH DIFFERENTIAL (CANCER CENTER ONLY)
Abs Immature Granulocytes: 0.01 K/uL (ref 0.00–0.07)
Basophils Absolute: 0 K/uL (ref 0.0–0.1)
Basophils Relative: 1 %
Eosinophils Absolute: 0.1 K/uL (ref 0.0–0.5)
Eosinophils Relative: 3 %
HCT: 36.6 % (ref 36.0–46.0)
Hemoglobin: 12 g/dL (ref 12.0–15.0)
Immature Granulocytes: 0 %
Lymphocytes Relative: 26 %
Lymphs Abs: 1.1 K/uL (ref 0.7–4.0)
MCH: 30 pg (ref 26.0–34.0)
MCHC: 32.8 g/dL (ref 30.0–36.0)
MCV: 91.5 fL (ref 80.0–100.0)
Monocytes Absolute: 0.3 K/uL (ref 0.1–1.0)
Monocytes Relative: 8 %
Neutro Abs: 2.6 K/uL (ref 1.7–7.7)
Neutrophils Relative %: 62 %
Platelet Count: 157 K/uL (ref 150–400)
RBC: 4 MIL/uL (ref 3.87–5.11)
RDW: 12.4 % (ref 11.5–15.5)
WBC Count: 4.2 K/uL (ref 4.0–10.5)
nRBC: 0 % (ref 0.0–0.2)

## 2024-07-21 LAB — CMP (CANCER CENTER ONLY)
ALT: 24 U/L (ref 0–44)
AST: 28 U/L (ref 15–41)
Albumin: 4.3 g/dL (ref 3.5–5.0)
Alkaline Phosphatase: 51 U/L (ref 38–126)
Anion gap: 11 (ref 5–15)
BUN: 14 mg/dL (ref 8–23)
CO2: 23 mmol/L (ref 22–32)
Calcium: 9.4 mg/dL (ref 8.9–10.3)
Chloride: 107 mmol/L (ref 98–111)
Creatinine: 0.6 mg/dL (ref 0.44–1.00)
GFR, Estimated: >60 mL/min (ref >60)
Glucose, Bld: 110 mg/dL — ABNORMAL HIGH (ref 70–99)
Potassium: 4.4 mmol/L (ref 3.5–5.1)
Sodium: 141 mmol/L (ref 135–145)
Total Bilirubin: 0.5 mg/dL (ref 0.0–1.2)
Total Protein: 6.7 g/dL (ref 6.5–8.1)

## 2024-07-21 LAB — LACTATE DEHYDROGENASE: LDH: 251 U/L — ABNORMAL HIGH (ref 98–192)

## 2024-07-21 MED ORDER — SODIUM CHLORIDE 0.9 % IV SOLN: Freq: Once | INTRAVENOUS | Status: AC

## 2024-07-21 MED ORDER — RIFAXIMIN 200 MG PO TABS: 200.0000 mg | ORAL_TABLET | Freq: Three times a day (TID) | ORAL | 1 refills | Status: DC

## 2024-07-21 NOTE — Patient Instructions (Signed)
 Dehydration, Adult Dehydration is a condition in which there is not enough water or other fluids in the body. This happens when a person loses more fluids than they take in. Important organs cannot work right without the right amount of fluids. Any loss of fluids from the body can cause dehydration. Dehydration can be mild, worse, or very bad. It should be treated right away to keep it from getting very bad. What are the causes? Conditions that cause loss of water in the body. They include: Watery poop (diarrhea). Vomiting. Sweating a lot. Fever. Infection. Peeing (urinating) a lot. Not drinking enough fluids. Certain medicines, such as medicines that take extra fluid out of the body (diuretics). Lack of safe drinking water. Not being able to get enough water and food. What increases the risk? Having a long-term (chronic) illness that has not been treated the right way, such as: Diabetes. Heart disease. Kidney disease. Being 25 years of age or older. Having a disability. Living in a place that is high above the ground or sea (high in altitude). The thinner, drier air causes more fluid loss. Doing exercises that put stress on your body for a long time. Being active when in hot places. What are the signs or symptoms? Symptoms of dehydration depend on how bad it is. Mild or worse dehydration Thirst. Dry lips or dry mouth. Feeling dizzy or light-headed. Muscle cramps. Passing little pee or dark pee. Pee may be the color of tea. Headache. Very bad dehydration Changes in skin. Skin may: Be cold to the touch (clammy). Be blotchy or pale. Not go back to normal right after you pinch it and let it go. Little or no tears, pee, or sweat. Fast breathing. Low blood pressure. Weak pulse. Pulse that is more than 100 beats a minute when you are sitting still. Other changes, such as: Feeling very thirsty. Eyes that look hollow (sunken). Cold hands and feet. Being confused. Being very  tired (lethargic) or having trouble waking from sleep. Losing weight. Loss of consciousness. How is this treated? Treatment for this condition depends on how bad your dehydration is. Treatment should start right away. Do not wait until your condition gets very bad. Very bad dehydration is an emergency. You will need to go to a hospital. Mild or worse dehydration can be treated at home. You may be asked to: Drink more fluids. Drink an oral rehydration solution (ORS). This drink gives you the right amount of fluids, salts, and minerals (electrolytes). Very bad dehydration can be treated: With fluids through an IV tube. By correcting low levels of electrolytes in the body. By treating the problem that caused your dehydration. Follow these instructions at home: Oral rehydration solution If told by your doctor, drink an ORS: Make an ORS. Use instructions on the package. Start by drinking small amounts, about  cup (120 mL) every 5-10 minutes. Slowly drink more until you have had the amount that your doctor said to have.  Eating and drinking  Drink enough clear fluid to keep your pee pale yellow. If you were told to drink an ORS, finish the ORS first. Then, start slowly drinking other clear fluids. Drink fluids such as: Water. Do not drink only water. Doing that can make the salt (sodium) level in your body get too low. Water from ice chips you suck on. Fruit juice that you have added water to (diluted). Low-calorie sports drinks. Eat foods that have the right amounts of salts and minerals, such as bananas, oranges, potatoes,  tomatoes, or spinach. Do not drink alcohol. Avoid drinks that have caffeine or sugar. These include:: High-calorie sports drinks. Fruit juice that you did not add water to. Soda. Coffee or energy drinks. Avoid foods that are greasy or have a lot of fat or sugar. General instructions Take over-the-counter and prescription medicines only as told by your doctor. Do  not take sodium tablets. Doing that can make the salt level in your body get too high. Return to your normal activities as told by your doctor. Ask your doctor what activities are safe for you. Keep all follow-up visits. Your doctor may check and change your treatment. Contact a doctor if: You have pain in your belly (abdomen) and the pain: Gets worse. Stays in one place. You have a rash. You have a stiff neck. You get angry or annoyed more easily than normal. You are more tired or have a harder time waking than normal. You feel weak or dizzy. You feel very thirsty. Get help right away if: You have any symptoms of very bad dehydration. You vomit every time you eat or drink. Your vomiting gets worse, does not go away, or you vomit blood or green stuff. You are getting treatment, but symptoms are getting worse. You have a fever. You have a very bad headache. You have: Diarrhea that gets worse or does not go away. Blood in your poop (stool). This may cause poop to look black and tarry. No pee in 6-8 hours. Only a small amount of pee in 6-8 hours, and the pee is very dark. You have trouble breathing. These symptoms may be an emergency. Get help right away. Call 911. Do not wait to see if the symptoms will go away. Do not drive yourself to the hospital. This information is not intended to replace advice given to you by your health care provider. Make sure you discuss any questions you have with your health care provider. Document Revised: 05/25/2022 Document Reviewed: 05/25/2022 Elsevier Patient Education  2024 [employer].

## 2024-07-21 NOTE — Telephone Encounter (Signed)
 Spoke with patient and advised her to call her GI today to see if they can see her sooner with her having diarrhea and being in the ER again.  She stated that she will call them today.  She also canceled her appt with Harlene for next week because she only wanted to see you.  I put her in for 1120 on 10/16.  Hopefully you can see her and hopefully she will keep her appointment.  She has been in the ER on 9/1 and 9/9.

## 2024-07-21 NOTE — Patient Instructions (Signed)

## 2024-07-21 NOTE — Progress Notes (Signed)
 Hematology and Oncology Follow Up Visit  Tami [name] [id number] 1956/07/19 68 y.o. 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  She is still bothered by the diarrhea.  She is having abdominal pain.  She keeps having diarrhea.  She had a upper endoscopy which was relatively unremarkable.  She still feels this is from the C. difficile that she has had in the past.  I will try her on some Xifaxin.  Maybe, this will help her out.  We still do not have to do any treatment for myeloma.  When we last saw her, her monoclonal spike was 0.2 g/dL.  Her IgG level was 860 mg/dL.  Her kappa light chain was 0.3 mg/dL.  I just feel bad that she is having these abdominal issues.  She has had this for quite a while now.  She has had no fever.  She says she has a fever in her belly.    She has had no problems with her pacemaker.  Has been no cardiac issues.  Overall, I would say that her performance status is probably ECOG 1.    Medications:  Allergies as of 07/21/2024       Reactions   Benazepril  Anaphylaxis, Swelling, Hives   angioedema Throat and lip swelling   Ondansetron  Hcl Hives   Redness and hives post IV admin on 07/05/17   Codeine Nausea And Vomiting   Morphine Hives   Redness and hives noted post IV admin on 07/05/17        Medication List        Accurate as of July 21, 2024  8:47 AM. If you have any questions, ask your nurse or doctor.          acetaminophen   500 MG tablet Commonly known as: TYLENOL  Take 500 mg by mouth every 6 (six) hours as needed.   acyclovir  400 MG tablet Commonly known as: ZOVIRAX  TAKE ONE (1) TABLET BY MOUTH TWO (2) TIMES DAILY   albuterol  (2.5 MG/3ML) 0.083% nebulizer solution Commonly known as: PROVENTIL  Take 3 mLs (2.5 mg total) by nebulization every 6 (six) hours as needed for wheezing or shortness of breath.   albuterol  108 (90 Base) MCG/ACT inhaler Commonly known as: VENTOLIN  HFA Inhale 2 puffs into the lungs every 6 (six) hours as needed for wheezing or shortness of breath.   amLODipine  5 MG tablet Commonly known as: NORVASC  Take 5 mg by mouth daily.   aspirin  EC 81 MG tablet Take 81 mg by mouth daily. Swallow whole.   budesonide  0.5 MG/2ML nebulizer solution Commonly known as: Pulmicort  Take 2 mLs (0.5 mg total) by nebulization 2 (two) times daily as needed (cough, wheezing or shortness of breath).   CENTRUM SILVER  WOMEN 50+ PO Take 1 tablet by mouth daily at 6 (six) AM.   cetirizine  10 MG tablet Commonly known as: ZYRTEC  Take 10 mg by mouth at bedtime.   cholestyramine  4 g packet  Commonly known as: Questran  Take 1 packet by mouth daily. Separate from other medications by at least 2 hours. Start at daily and can increase to twice daily if no improvement after 7 days.   cholestyramine  light 4 g packet Commonly known as: PREVALITE  Take 4 g by mouth 2 (two) times daily.   cyclobenzaprine  5 MG tablet Commonly known as: FLEXERIL  Take 1 tablet (5 mg total) by mouth 2 (two) times daily as needed for up to 10 doses for muscle spasms.   dicyclomine  20 MG tablet Commonly known as: BENTYL  Take 1 tablet (20 mg total) by mouth 3 (three) times daily before meals.   diphenhydrAMINE  25 MG tablet Commonly known as: BENADRYL  Take 25 mg by mouth.   estradiol  0.1 MG/GM vaginal cream Commonly known as: ESTRACE  Place 0.5 g vaginally 2 (two) times a week. Place 0.5g nightly for two weeks then twice a week  after   famotidine  20 MG tablet Commonly known as: PEPCID  Take 1 tablet (20 mg total) by mouth at bedtime.   ferrous sulfate 325 (65 FE) MG tablet Commonly known as: FeroSul Take 1 tablet (325 mg total) by mouth daily with breakfast.   fluticasone  50 MCG/ACT nasal spray Commonly known as: FLONASE  Place 2 sprays into both nostrils daily.   furosemide  20 MG tablet Commonly known as: LASIX  Take 20 mg by mouth daily as needed (fluid retention (feet swelling)).   Gemtesa  75 MG Tabs Generic drug: Vibegron  Take 1 tablet (75 mg total) by mouth daily.   hydrOXYzine  10 MG tablet Commonly known as: ATARAX  TAKE ONE (1) TABLET BY MOUTH 3 TIMES DAILY AS NEEDED FOR ITCHING   lidocaine  5 % ointment Commonly known as: XYLOCAINE  Apply 1 Application topically as needed.   lidocaine -prilocaine  cream Commonly known as: EMLA  Apply 1 Application topically as needed.   loperamide  2 MG capsule Commonly known as: IMODIUM  Take 4 mg by mouth in the morning and at bedtime.   losartan  50 MG tablet Commonly known as: COZAAR  Take 50 mg by mouth in the morning and at bedtime.   Magnesium  Glycinate 100 MG Caps Take 200 mg by mouth at bedtime.   meclizine  12.5 MG tablet Commonly known as: ANTIVERT  Take 12.5 mg by mouth 2 (two) times daily as needed for dizziness or nausea.   metoprolol  succinate 50 MG 24 hr tablet Commonly known as: TOPROL -XL Take 1 tablet (50 mg total) by mouth 2 (two) times daily.   montelukast  10 MG tablet Commonly known as: Singulair  Take 1 tablet (10 mg total) by mouth at bedtime. Please start taking this on September 4.   neomycin -polymyxin-hydrocortisone OTIC solution Commonly known as: CORTISPORIN Place 4 drops into both ears 4 (four) times daily.   oxyCODONE -acetaminophen  5-325 MG tablet Commonly known as: Percocet Take 2 tablets by mouth every 6 (six) hours as needed.   pantoprazole  40 MG tablet Commonly known as: PROTONIX  Take 1 tablet (40 mg total) by  mouth daily.   phenazopyridine  200 MG tablet Commonly known as: Pyridium  Take 1 tablet (200 mg total) by mouth 3 (three) times daily as needed for pain.   phenazopyridine  100 MG tablet Commonly known as: Pyridium  Take 1 tablet (100 mg total) by mouth 3 (three) times daily as needed for pain.   Pillow Mask/Adult Misc   potassium chloride  SA 20 MEQ tablet Commonly known as: KLOR-CON  M TAKE 1 TABLET BY MOUTH EVERY MONDAY, WEDNESDAY, AND FRIDAY.   prochlorperazine  10 MG tablet Commonly known as: COMPAZINE  Take 0.5 tablets (5 mg  total) by mouth 2 (two) times daily as needed for nausea or vomiting.   scopolamine  1 MG/3DAYS Commonly known as: TRANSDERM-SCOP Place 1 patch (1.5 mg total) onto the skin every 3 (three) days.   spironolactone  25 MG tablet Commonly known as: ALDACTONE  Take 25 mg by mouth daily.   sucralfate  1 GM/10ML suspension Commonly known as: Carafate  Take 10 mLs (1 g total) by mouth 4 (four) times daily -  with meals and at bedtime.   Vitamin D3 50 MCG (2000 UT) Tabs Take 2,000 Units by mouth in the morning.        Allergies:  Allergies  Allergen Reactions   Benazepril  Anaphylaxis, Swelling and Hives    angioedema Throat and lip swelling   Ondansetron  Hcl Hives    Redness and hives post IV admin on 07/05/17   Codeine Nausea And Vomiting   Morphine Hives    Redness and hives noted post IV admin on 07/05/17    Past Medical History, Surgical history, Social history, and Family History were reviewed and updated.  Review of Systems: Review of Systems  Constitutional: Negative.   HENT: Negative.    Eyes: Negative.   Respiratory: Negative.    Cardiovascular: Negative.   Gastrointestinal:  Positive for abdominal pain, diarrhea and nausea.  Musculoskeletal:  Positive for joint pain and myalgias.  Skin: Negative.   Neurological: Negative.   Endo/Heme/Allergies: Negative.   Psychiatric/Behavioral: Negative.       Physical Exam:  height is 5' 4  (1.626 m) and weight is 192 lb 3.2 oz (87.2 kg). Her oral temperature is 98.4 F (36.9 C). Her blood pressure is 153/93 (abnormal) and her pulse is 78. Her respiration is 20 and oxygen saturation is 100%.   Wt Readings from Last 3 Encounters:  07/21/24 192 lb 3.2 oz (87.2 kg)  07/03/24 197 lb (89.4 kg)  07/02/24 191 lb (86.6 kg)    Physical Exam Vitals reviewed.  HENT:     Head: Normocephalic and atraumatic.  Eyes:     Pupils: Pupils are equal, round, and reactive to light.  Cardiovascular:     Rate and Rhythm: Normal rate and regular rhythm.     Heart sounds: Normal heart sounds.  Pulmonary:     Effort: Pulmonary effort is normal.     Breath sounds: Normal breath sounds.  Abdominal:     General: Bowel sounds are normal.     Palpations: Abdomen is soft.  Musculoskeletal:        General: No tenderness or deformity. Normal range of motion.     Cervical back: Normal range of motion.  Lymphadenopathy:     Cervical: No cervical adenopathy.  Skin:    General: Skin is warm and dry.     Findings: No erythema or rash.  Neurological:     Mental Status: She is alert and oriented to person, place, and time.  Psychiatric:        Behavior: Behavior normal.        Thought Content: Thought content normal.        Judgment: Judgment normal.      Lab Results  Component Value Date   WBC 4.6 07/02/2024   HGB 12.6 07/02/2024   HCT 38.8 07/02/2024   MCV 92.2 07/02/2024   PLT 183 07/02/2024   Lab Results  Component Value Date   FERRITIN 166 11/26/2023   IRON  76 11/26/2023   TIBC 385 11/26/2023   UIBC 309 11/26/2023   IRONPCTSAT 20 11/26/2023   Lab  Results  Component Value Date   RETICCTPCT 1.2 11/27/2022   RBC 4.21 07/02/2024   RETICCTABS 32,640 07/16/2016   Lab Results  Component Value Date   KPAFRELGTCHN 3.4 06/02/2024   LAMBDASER 1.5 (L) 06/02/2024   KAPLAMBRATIO 2.27 (H) 06/02/2024   Lab Results  Component Value Date   IGGSERUM 845 06/02/2024   IGGSERUM 879  06/02/2024   IGA 37 (L) 06/02/2024   IGA 37 (L) 06/02/2024   IGMSERUM 22 (L) 06/02/2024   IGMSERUM 22 (L) 06/02/2024   Lab Results  Component Value Date   TOTALPROTELP 6.6 06/02/2024   ALBUMINELP 3.9 06/02/2024   A1GS 0.1 06/02/2024   A2GS 0.9 06/02/2024   BETS 1.0 06/02/2024   BETA2SER 0.5 07/16/2016   GAMS 0.6 06/02/2024   MSPIKE 0.2 (H) 06/02/2024   SPEI Comment 03/22/2024     Chemistry      Component Value Date/Time   NA 144 07/02/2024 1754   NA 143 11/08/2017 1127   NA 140 12/31/2016 1000   K 4.1 07/02/2024 1754   K 4.0 11/08/2017 1127   K 3.6 12/31/2016 1000   CL 107 07/02/2024 1754   CL 107 11/08/2017 1127   CO2 24 07/02/2024 1754   CO2 25 11/08/2017 1127   CO2 24 12/31/2016 1000   BUN 19 07/02/2024 1754   BUN 19 11/08/2017 1127   BUN 22.3 12/31/2016 1000   CREATININE 0.72 07/02/2024 1754   CREATININE 0.59 06/23/2024 0945   CREATININE 0.63 03/15/2024 0947   CREATININE 0.8 12/31/2016 1000      Component Value Date/Time   CALCIUM 9.7 07/02/2024 1754   CALCIUM 9.9 11/08/2017 1127   CALCIUM 9.5 12/31/2016 1000   ALKPHOS 58 07/02/2024 1754   ALKPHOS 44 11/08/2017 1127   ALKPHOS 51 12/31/2016 1000   AST 20 07/02/2024 1754   AST 22 06/23/2024 0945   AST 28 12/31/2016 1000   ALT 19 07/02/2024 1754   ALT 29 06/23/2024 0945   ALT 36 11/08/2017 1127   ALT 16 12/31/2016 1000   BILITOT 0.3 07/02/2024 1754   BILITOT 0.6 06/23/2024 0945   BILITOT 0.46 12/31/2016 1000       Impression and Plan: Tami [name] is a very pleasant 68 yo African American female with a previous IgG kappa MGUS with progression to myeloma. Again, she has responded very nicely to treatment.   Everything looks great with the myeloma.  I do feel bad that she has a problems with her diarrhea.  Again, I will put her on some Xifaxan  to see if this may help.  I think she may have to go back to gastroenterology.  I think 1 option for her which might be aggressive would be a stool transplant.  I  know this is being done for her.  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  For right now, we will still hold on her treatment for the myeloma.  I do not see that we had to treat her.  We will plan to get her back in about 6 weeks or so.  I told her that she could always come back anytime if she feels that she needs IV fluids.   Maude JONELLE Crease, MD 9/12/20258:47 AM

## 2024-07-22 LAB — IGG, IGA, IGM
IgA: 41 mg/dL — ABNORMAL LOW (ref 87–352)
IgG (Immunoglobin G), Serum: 712 mg/dL (ref 586–1602)
IgM (Immunoglobulin M), Srm: 25 mg/dL — ABNORMAL LOW (ref 26–217)

## 2024-07-23 ENCOUNTER — Other Ambulatory Visit: Payer: Self-pay

## 2024-07-23 DIAGNOSIS — R197 Diarrhea, unspecified: Secondary | ICD-10-CM | POA: Diagnosis not present

## 2024-07-24 ENCOUNTER — Encounter: Payer: Self-pay | Admitting: Hematology & Oncology

## 2024-07-24 LAB — KAPPA/LAMBDA LIGHT CHAINS
Kappa free light chain: 6.8 mg/L (ref 3.3–19.4)
Kappa, lambda light chain ratio: 2.52 — ABNORMAL HIGH (ref 0.26–1.65)
Lambda free light chains: 2.7 mg/L — ABNORMAL LOW (ref 5.7–26.3)

## 2024-07-25 ENCOUNTER — Ambulatory Visit (INDEPENDENT_AMBULATORY_CARE_PROVIDER_SITE_OTHER)

## 2024-07-25 ENCOUNTER — Encounter: Payer: Self-pay | Admitting: Family Medicine

## 2024-07-25 ENCOUNTER — Ambulatory Visit (INDEPENDENT_AMBULATORY_CARE_PROVIDER_SITE_OTHER): Admitting: Family Medicine

## 2024-07-25 ENCOUNTER — Other Ambulatory Visit: Payer: Self-pay

## 2024-07-25 VITALS — BP 124/67 | HR 92 | Ht 64.0 in | Wt 193.0 lb

## 2024-07-25 VITALS — Ht 64.0 in | Wt 192.0 lb

## 2024-07-25 DIAGNOSIS — R197 Diarrhea, unspecified: Secondary | ICD-10-CM | POA: Diagnosis not present

## 2024-07-25 DIAGNOSIS — Z0000 Encounter for general adult medical examination without abnormal findings: Secondary | ICD-10-CM | POA: Diagnosis not present

## 2024-07-25 DIAGNOSIS — R1084 Generalized abdominal pain: Secondary | ICD-10-CM

## 2024-07-25 LAB — AMYLASE: Amylase: 54 U/L (ref 27–131)

## 2024-07-25 LAB — LIPASE: Lipase: 16 U/L (ref 11.0–59.0)

## 2024-07-25 NOTE — Progress Notes (Signed)
 Subjective:   Tami [name] is a 68 y.o. who presents for a Medicare Wellness preventive visit.  As a reminder, Annual Wellness Visits don't include a physical exam, and some assessments may be limited, especially if this visit is performed virtually. We may recommend an in-person follow-up visit with your provider if needed.  Visit Complete: Virtual I connected with  Tami [name] on 07/25/24 by a audio enabled telemedicine application and verified that I am speaking with the correct person using two identifiers.  Patient Location: Home  Provider Location: Home Office  I discussed the limitations of evaluation and management by telemedicine. The patient expressed understanding and agreed to proceed.  Vital Signs: Because this visit was a virtual/telehealth visit, some criteria may be missing or patient reported. Any vitals not documented were not able to be obtained and vitals that have been documented are patient reported.    Persons Participating in Visit: Patient.  AWV Questionnaire: No: Patient Medicare AWV questionnaire was not completed prior to this visit.  Cardiac Risk Factors include: advanced age (>72men, >56 women);hypertension     Objective:    Today's Vitals   07/25/24 9187  Weight: 192 lb (87.1 kg)  Height: 5' 4 (1.626 m)  PainSc: 0-No pain   Body mass index is 32.96 kg/m.     07/25/2024    8:22 AM 07/21/2024    8:43 AM 07/03/2024    8:20 AM 07/02/2024    5:42 PM 06/02/2024    8:32 AM 05/20/2024   11:04 AM 04/25/2024    9:27 AM  Advanced Directives  Does Patient Have a Medical Advance Directive? Yes Yes Yes No No No Yes  Type of Estate agent of [place];Living will Healthcare Power of [place];Living will Healthcare Power of [place];Living will  Healthcare Power of [employer] Power of [employer] Power of Attorney  Does patient want to make changes to medical advance directive? No - Patient declined No - Patient  declined       Copy of Healthcare Power of Attorney in Chart? Yes - validated most recent copy scanned in chart (See row information) No - copy requested No - copy requested  No - copy requested    Would patient like information on creating a medical advance directive? No - Patient declined No - Patient declined   No - Patient declined      Current Medications (verified) Outpatient Encounter Medications as of 07/25/2024  Medication Sig   acetaminophen  (TYLENOL ) 500 MG tablet Take 500 mg by mouth every 6 (six) hours as needed.   acyclovir  (ZOVIRAX ) 400 MG tablet TAKE ONE (1) TABLET BY MOUTH TWO (2) TIMES DAILY   albuterol  (PROVENTIL ) (2.5 MG/3ML) 0.083% nebulizer solution Take 3 mLs (2.5 mg total) by nebulization every 6 (six) hours as needed for wheezing or shortness of breath.   albuterol  (VENTOLIN  HFA) 108 (90 Base) MCG/ACT inhaler Inhale 2 puffs into the lungs every 6 (six) hours as needed for wheezing or shortness of breath.   amLODipine  (NORVASC ) 5 MG tablet Take 5 mg by mouth daily.   aspirin  EC 81 MG tablet Take 81 mg by mouth daily. Swallow whole.   budesonide  (PULMICORT ) 0.5 MG/2ML nebulizer solution Take 2 mLs (0.5 mg total) by nebulization 2 (two) times daily as needed (cough, wheezing or shortness of breath).   cetirizine  (ZYRTEC ) 10 MG tablet Take 10 mg by mouth at bedtime.   Cholecalciferol (VITAMIN D3) 50 MCG (2000 UT) TABS Take 2,000 Units by mouth  in the morning.   cholestyramine  (QUESTRAN ) 4 g packet Take 1 packet by mouth daily. Separate from other medications by at least 2 hours. Start at daily and can increase to twice daily if no improvement after 7 days.   cholestyramine  light (PREVALITE ) 4 g packet Take 4 g by mouth 2 (two) times daily.   cyclobenzaprine  (FLEXERIL ) 5 MG tablet Take 1 tablet (5 mg total) by mouth 2 (two) times daily as needed for up to 10 doses for muscle spasms.   dicyclomine  (BENTYL ) 20 MG tablet Take 1 tablet (20 mg total) by mouth 3 (three) times daily  before meals.   diphenhydrAMINE  (BENADRYL ) 25 MG tablet Take 25 mg by mouth.   estradiol  (ESTRACE ) 0.1 MG/GM vaginal cream Place 0.5 g vaginally 2 (two) times a week. Place 0.5g nightly for two weeks then twice a week after   famotidine  (PEPCID ) 20 MG tablet Take 1 tablet (20 mg total) by mouth at bedtime.   ferrous sulfate (FEROSUL) 325 (65 FE) MG tablet Take 1 tablet (325 mg total) by mouth daily with breakfast.   fluticasone  (FLONASE ) 50 MCG/ACT nasal spray Place 2 sprays into both nostrils daily.   furosemide  (LASIX ) 20 MG tablet Take 20 mg by mouth daily as needed (fluid retention (feet swelling)).   hydrOXYzine  (ATARAX ) 10 MG tablet TAKE ONE (1) TABLET BY MOUTH 3 TIMES DAILY AS NEEDED FOR ITCHING   lidocaine  (XYLOCAINE ) 5 % ointment Apply 1 Application topically as needed.   lidocaine -prilocaine  (EMLA ) cream Apply 1 Application topically as needed.   loperamide  (IMODIUM ) 2 MG capsule Take 4 mg by mouth in the morning and at bedtime.   losartan  (COZAAR ) 50 MG tablet Take 50 mg by mouth in the morning and at bedtime.   Magnesium  Glycinate 100 MG CAPS Take 200 mg by mouth at bedtime.   meclizine  (ANTIVERT ) 12.5 MG tablet Take 12.5 mg by mouth 2 (two) times daily as needed for dizziness or nausea.   metoprolol  succinate (TOPROL -XL) 50 MG 24 hr tablet Take 1 tablet (50 mg total) by mouth 2 (two) times daily.   montelukast  (SINGULAIR ) 10 MG tablet Take 1 tablet (10 mg total) by mouth at bedtime. Please start taking this on September 4.   Multiple Vitamins-Minerals (CENTRUM SILVER  WOMEN 50+ PO) Take 1 tablet by mouth daily at 6 (six) AM.   neomycin -polymyxin-hydrocortisone (CORTISPORIN) OTIC solution Place 4 drops into both ears 4 (four) times daily.   oxyCODONE -acetaminophen  (PERCOCET) 5-325 MG tablet Take 2 tablets by mouth every 6 (six) hours as needed.   pantoprazole  (PROTONIX ) 40 MG tablet Take 1 tablet (40 mg total) by mouth daily.   phenazopyridine  (PYRIDIUM ) 100 MG tablet Take 1 tablet  (100 mg total) by mouth 3 (three) times daily as needed for pain.   phenazopyridine  (PYRIDIUM ) 200 MG tablet Take 1 tablet (200 mg total) by mouth 3 (three) times daily as needed for pain.   potassium chloride  SA (KLOR-CON  M) 20 MEQ tablet TAKE 1 TABLET BY MOUTH EVERY MONDAY, WEDNESDAY, AND FRIDAY.   prochlorperazine  (COMPAZINE ) 10 MG tablet Take 0.5 tablets (5 mg total) by mouth 2 (two) times daily as needed for nausea or vomiting.   Respiratory Therapy Supplies (PILLOW MASK/ADULT) MISC    rifaximin  (XIFAXAN ) 200 MG tablet Take 1 tablet (200 mg total) by mouth 3 (three) times daily.   scopolamine  (TRANSDERM-SCOP) 1 MG/3DAYS Place 1 patch (1.5 mg total) onto the skin every 3 (three) days.   spironolactone  (ALDACTONE ) 25 MG tablet Take 25 mg  by mouth daily.   sucralfate  (CARAFATE ) 1 GM/10ML suspension Take 10 mLs (1 g total) by mouth 4 (four) times daily -  with meals and at bedtime.   Vibegron  (GEMTESA ) 75 MG TABS Take 1 tablet (75 mg total) by mouth daily.   Facility-Administered Encounter Medications as of 07/25/2024  Medication   acetaminophen  (TYLENOL ) tablet 650 mg   nitroGLYCERIN  (NITROSTAT ) SL tablet 0.4 mg   sodium chloride  flush (NS) 0.9 % injection 10 mL    Allergies (verified) Benazepril , Ondansetron  hcl, Codeine, and Morphine   History: Past Medical History:  Diagnosis Date   Abnormal SPEP 07/26/2016   Arthritis    knees, hands   Back pain 07/14/2016   Bowel obstruction (HCC) 11/2014   C. 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/2016   Multiple myeloma (HCC) 07/20/2018   Multiple myeloma not having achieved remission (HCC) 07/20/2018   Myalgia 12/31/2016   Obstructive sleep apnea 03/31/2015   Presence of permanent cardiac pacemaker    SOB (shortness of breath) 04/09/2016   Stroke (HCC)    TIAs   TIA (transient ischemic attack)    UTI (urinary tract infection)    Past Surgical History:  Procedure Laterality Date   ABDOMINAL HYSTERECTOMY     menorraghia, 2006, total   BIOPSY  09/01/2022   Procedure: BIOPSY;  Surgeon: Charlanne Groom, MD;  Location: [facility];  Service: Gastroenterology;;   CHOLECYSTECTOMY     COLONOSCOPY  2018   [facility] per patient   COLONOSCOPY WITH PROPOFOL  N/A 09/01/2022   Procedure: COLONOSCOPY WITH PROPOFOL ;  Surgeon: Charlanne Groom, MD;  Location: [facility];  Service: Gastroenterology;  Laterality: N/A;   ESOPHAGOGASTRODUODENOSCOPY  2018   [facility]    ESOPHAGOGASTRODUODENOSCOPY N/A 07/03/2024   Procedure: EGD (ESOPHAGOGASTRODUODENOSCOPY);  Surgeon: San Sandor GAILS, DO;  Location: [facility];  Service: Gastroenterology;  Laterality: N/A;   ESOPHAGOGASTRODUODENOSCOPY (EGD) WITH PROPOFOL  N/A 09/01/2022   Procedure: ESOPHAGOGASTRODUODENOSCOPY (EGD) WITH PROPOFOL ;  Surgeon: Charlanne Groom, MD;  Location: [facility];  Service: Gastroenterology;  Laterality: N/A;   IR IMAGING GUIDED PORT INSERTION  06/29/2023   KNEE SURGERY     right, repair torn torn cartialage   PACEMAKER INSERTION  08/2014   changed last in 2023 per pt at [facility]   TONSILLECTOMY     TUBAL LIGATION     Family History  Problem Relation Age of Onset   Diabetes Mother    Hypertension Mother    Heart disease Mother        s/p 1 stent   Hyperlipidemia Mother    Arthritis Mother    Colon cancer Father 29   Irritable bowel syndrome Sister    Hypertension Maternal Grandmother    Arthritis Maternal Grandmother    Heart disease Maternal Grandfather        MI   Hypertension  Maternal Grandfather    Arthritis Maternal Grandfather    Hyperlipidemia Daughter    Hypertension Daughter    Hypertension Son    Esophageal cancer  Neg Hx    Uterine cancer Neg Hx    Pancreatic cancer Neg Hx    Social History   Socioeconomic History   Marital status: Single    Spouse name: [name]   Number of children: 3   Years of education: [name]   Highest education level: High school graduate  Occupational History   [name]  Tobacco Use   Smoking status: Never    Passive exposure: Never   Smokeless tobacco: Never  Vaping Use   Vaping status: Never Used  Substance and Sexual Activity   Alcohol use: No   Drug use: No   Sexual activity: Not Currently    Partners: Male    Birth control/protection: Post-menopausal  Other Topics Concern   [name]  Social History Narrative   Lives at home alone   Retired   Caffeine: coffee   Social Drivers of Corporate investment banker Strain: Low Risk  (07/25/2024)   Overall Financial Resource Strain (CARDIA)    Difficulty of Paying Living Expenses: Not hard at all  Food Insecurity: No Food Insecurity (07/25/2024)   Hunger Vital Sign    Worried About Running Out of Food in the Last Year: Never true    Ran Out of Food in the Last Year: Never true  Transportation Needs: No Transportation Needs (07/25/2024)   PRAPARE - Administrator, Civil Service (Medical): No    Lack of Transportation (Non-Medical): No  Physical Activity: Sufficiently Active (07/25/2024)   Exercise Vital Sign    Days of Exercise per Week: 3 days    Minutes of Exercise per Session: 60 min  Stress: No Stress Concern Present (07/25/2024)   [employer] of Occupational Health - Occupational Stress Questionnaire    Feeling of Stress: Not at all  Social Connections: Moderately Integrated (07/25/2024)   Social Connection and Isolation Panel    Frequency of Communication with Friends and Family: More than three times a week    Frequency  of Social Gatherings with Friends and Family: More than three times a week    Attends Religious Services: More than 4 times per year    Active Member of [employer] or Organizations: Yes    Attends Engineer, structural: More than 4 times per year    Marital Status: Divorced    Tobacco Counseling Counseling given: Not Answered    Clinical Intake:  Pre-visit preparation completed: Yes  Pain : No/denies pain Pain Score: 0-No pain     BMI - recorded: 32.96 Nutritional Status: BMI > 30  Obese Nutritional Risks: None Diabetes: No  Lab Results  Component Value Date   HGBA1C 5.7 05/18/2024   HGBA1C 5.4 01/24/2024   HGBA1C 5.5 09/16/2023     How often do you need to have someone help you when you read instructions, pamphlets, or other written materials from your doctor or pharmacy?: 1 - Never  Interpreter Needed?: No  Information entered by :: Rojelio Blush LPN   Activities of Daily Living     07/25/2024    8:19 AM  In your present state of health, do you have any difficulty performing the following activities:  Hearing? 0  Vision? 0  Difficulty concentrating or making decisions? 0  Walking or climbing stairs? 1  Comment Uses Cane and Walker  Dressing or bathing? 0  Doing errands, shopping? 0  Preparing Food and eating ? N  Using the Toilet? N  In the past six  months, have you accidently leaked urine? N  Do you have problems with loss of bowel control? Y  Comment Followed by medical attention  Managing your Medications? N  Managing your Finances? N  Housekeeping or managing your Housekeeping? Y  Comment Aide assist    Patient Care Team: Domenica Harlene LABOR, MD as PCP - General (Family Medicine) Timmy Maude SAUNDERS, MD as Consulting Physician (Oncology) Marleen Redell SAUNDERS, MD as Referring Physician (Specialist) Kara Dorn NOVAK, MD as Consulting Physician (Pulmonary Disease)  I have updated your Care Teams any recent Medical Services you may have received from  other providers in the past year.     Assessment:   This is a routine wellness examination for Tami [name].  Hearing/Vision screen Hearing Screening - Comments:: Denies hearing difficulties   Vision Screening - Comments:: Wears rx glasses - up to date with routine eye exams with  [facility]   Goals Addressed               This Visit's Progress     Increase physical activity (pt-stated)        Remain active.       Depression Screen     07/25/2024    8:18 AM 07/21/2024    8:00 AM 07/04/2024    4:33 PM 06/23/2024    9:00 AM 06/02/2024    8:29 AM 05/24/2024    9:07 AM 04/21/2024    1:13 PM  PHQ 2/9 Scores  PHQ - 2 Score 0 0 2 0 0 0 2  PHQ- 9 Score 0 1 4 0 0 0 5    Fall Risk     07/25/2024    8:21 AM 07/04/2024    4:32 PM 05/24/2024    9:07 AM 03/15/2024    9:23 AM 02/07/2024    9:31 AM  Fall Risk   Falls in the past year? 0 0 0 0 0  Number falls in past yr: 0 0 0 0   Injury with Fall? 0  0 0   Risk for fall due to : No Fall Risks No Fall Risks  No Fall Risks No Fall Risks  Follow up Falls evaluation completed Falls evaluation completed  Falls evaluation completed Falls evaluation completed    MEDICARE RISK AT HOME:  Medicare Risk at Home Any stairs in or around the home?: No If so, are there any without handrails?: No Home free of loose throw rugs in walkways, pet beds, electrical cords, etc?: Yes Adequate lighting in your home to reduce risk of falls?: Yes Life alert?: No Use of a cane, walker or w/c?: Yes Grab bars in the bathroom?: Yes Shower chair or bench in shower?: Yes Elevated toilet seat or a handicapped toilet?: Yes  TIMED UP AND GO:  Was the test performed?  No  Cognitive Function: 6CIT completed        07/25/2024    8:22 AM 07/08/2023    2:00 PM  6CIT Screen  What Year? 0 points 0 points  What month? 0 points 0 points  What time? 0 points 0 points  Count back from 20 0 points 0 points  Months in reverse 0 points 0 points  Repeat  phrase 0 points 0 points  Total Score 0 points 0 points    Immunizations Immunization History  Administered Date(s) Administered   Fluad Quad(high Dose 65+) 07/13/2019, 07/30/2020, 08/07/2021, 08/25/2022   Fluad Trivalent(High Dose 65+) 07/15/2023   Influenza Inj Mdck Quad With Preservative 12/16/2021  Influenza Split 07/14/2016, 07/27/2017   Influenza Whole 09/09/2009, 09/12/2013, 07/12/2015   Influenza,inj,Quad PF,6+ Mos 07/14/2016, 07/27/2017, 07/07/2018   Influenza-Unspecified 09/09/2009, 09/12/2013, 08/23/2014, 07/06/2015, 07/14/2016, 07/27/2017, 07/07/2018   PFIZER Comirnaty ETTERGray Top)Covid-19 Tri-Sucrose Vaccine 03/17/2021   PFIZER(Purple Top)SARS-COV-2 Vaccination 12/15/2019, 01/05/2020, 11/15/2020, 03/17/2021   Pfizer(Comirnaty )Fall Seasonal Vaccine 12 years and older 09/08/2022   Pneumococcal Conjugate-13 07/12/2015, 06/02/2021   Pneumococcal Polysaccharide-23 09/06/2014, 07/12/2015   Pneumococcal-Unspecified 07/06/2015   Tdap 12/01/2010, 10/11/2015   Zoster Recombinant(Shingrix ) 09/15/2022, 01/25/2023   Zoster, Live 10/11/2015, 09/15/2016    Screening Tests Health Maintenance  Topic Date Due   DEXA SCAN  11/07/2023   Influenza Vaccine  06/09/2024   COVID-19 Vaccine (7 - 2025-26 season) 07/10/2024   Mammogram  05/26/2025   Medicare Annual Wellness (AWV)  07/25/2025   DTaP/Tdap/Td (3 - Td or Tdap) 10/10/2025   Pneumococcal Vaccine: 50+ Years (4 of 4 - PCV20 or PCV21) 06/02/2026   Colonoscopy  12/12/2033   Hepatitis C Screening  Completed   Zoster Vaccines- Shingrix   Completed   HPV VACCINES  Aged Out   Meningococcal B Vaccine  Aged Out    Health Maintenance Items Addressed:   Additional Screening:  Vision Screening: Recommended annual ophthalmology exams for early detection of glaucoma and other disorders of the eye. Is the patient up to date with their annual eye exam?  Yes  Who is the provider or what is the name of the office in which the patient attends  annual eye exams? [facility]  Dental Screening: Recommended annual dental exams for proper oral hygiene  Community Resource Referral / Chronic Care Management: CRR required this visit?  No   CCM required this visit?  No   Plan:    I have personally reviewed and noted the following in the patient's chart:   Medical and social history Use of alcohol, tobacco or illicit drugs  Current medications and supplements including opioid prescriptions. Patient is not currently taking opioid prescriptions. Functional ability and status Nutritional status Physical activity Advanced directives List of other physicians Hospitalizations, surgeries, and ER visits in previous 12 months Vitals Screenings to include cognitive, depression, and falls Referrals and appointments  In addition, I have reviewed and discussed with patient certain preventive protocols, quality metrics, and best practice recommendations. A written personalized care plan for preventive services as well as general preventive health recommendations were provided to patient.   Rojelio LELON Blush, LPN   [id number]   After Visit Summary: (MyChart) Due to this being a telephonic visit, the after visit summary with patients personalized plan was offered to patient via MyChart   Notes: Nothing significant to report at this time.

## 2024-07-25 NOTE — Patient Instructions (Addendum)
 Tami [name],  Thank you for taking the time for your Medicare Wellness Visit. I appreciate your continued commitment to your health goals. Please review the care plan we discussed, and feel free to reach out if I can assist you further.  Medicare recommends these wellness visits once per year to help you and your care team stay ahead of potential health issues. These visits are designed to focus on prevention, allowing your provider to concentrate on managing your acute and chronic conditions during your regular appointments.  Please note that Annual Wellness Visits do not include a physical exam. Some assessments may be limited, especially if the visit was conducted virtually. If needed, we may recommend a separate in-person follow-up with your provider.  Ongoing Care Seeing your primary care provider every 3 to 6 months helps us  monitor your health and provide consistent, personalized care.   Referrals If a referral was made during today's visit and you haven't received any updates within two weeks, please contact the referred provider directly to check on the status.  Recommended Screenings:  Health Maintenance  Topic Date Due   DEXA scan (bone density measurement)  11/07/2023   Flu Shot  06/09/2024   COVID-19 Vaccine (7 - 2025-26 season) 07/10/2024   Breast Cancer Screening  05/26/2025   Medicare Annual Wellness Visit  07/25/2025   DTaP/Tdap/Td vaccine (3 - Td or Tdap) 10/10/2025   Pneumococcal Vaccine for age over 57 (4 of 4 - PCV20 or PCV21) 06/02/2026   Colon Cancer Screening  12/12/2033   Hepatitis C Screening  Completed   Zoster (Shingles) Vaccine  Completed   HPV Vaccine  Aged Out   Meningitis B Vaccine  Aged Out       07/25/2024    8:22 AM  Advanced Directives  Does Patient Have a Medical Advance Directive? Yes  Type of Estate agent of [place];Living will  Does patient want to make changes to medical advance directive? No - Patient declined  Copy  of Healthcare Power of Attorney in Chart? Yes - validated most recent copy scanned in chart (See row information)  Would patient like information on creating a medical advance directive? No - Patient declined   Advance Care Planning is important because it: Ensures you receive medical care that aligns with your values, goals, and preferences. Provides guidance to your family and loved ones, reducing the emotional burden of decision-making during critical moments.  Vision: Annual vision screenings are recommended for early detection of glaucoma, cataracts, and diabetic retinopathy. These exams can also reveal signs of chronic conditions such as diabetes and high blood pressure.  Dental: Annual dental screenings help detect early signs of oral cancer, gum disease, and other conditions linked to overall health, including heart disease and diabetes.  Please see the attached documents for additional preventive care recommendations.

## 2024-07-25 NOTE — Progress Notes (Signed)
 Acute Office Visit  Subjective:     [name], female    DOB: 29-Dec-1955, 68 y.o.   MRN: [id number]  Chief Complaint  Patient presents with   Medical Management of Chronic Issues   Diarrhea    HPI Patient is in today for diarrhea.    Discussed the use of AI scribe software for clinical note transcription with the patient, who gave verbal consent to proceed.  History of Present Illness Tami [name] is a 68 year old female with multiple myeloma who presents with severe abdominal pain and diarrhea.  She has been experiencing severe abdominal pain and diarrhea for the past two to three weeks. The pain typically occurs 30 to 45 minutes after eating and is described as severe cramping in the middle of her abdomen, similar to menstrual cramps. She also experiences nausea and sweating during these episodes. The diarrhea is frequent, with two episodes already today, and is not accompanied by blood.  She has a history of multiple myeloma and is following with Dr. Timmy. She has not had treatment for multiple myeloma in the past eight months. She has been receiving fluids at the infusion clinic and emergency department due to dehydration from diarrhea. Recent tests for Clostridium difficile and stool cultures were negative.  She lives alone and has been trying to stay hydrated with water, Pedialyte, and Ensure, but reports feeling weak and experiencing balance issues. Reports she has a good support system.         ROS All review of systems negative except what is listed in the HPI      Objective:    BP 124/67   Pulse 92   Ht 5' 4 (1.626 m)   Wt 193 lb (87.5 kg)   SpO2 98%   BMI 33.13 kg/m    Physical Exam Vitals reviewed.  Constitutional:      Appearance: Normal appearance.  Cardiovascular:     Rate and Rhythm: Normal rate and regular rhythm.  Pulmonary:     Effort: Pulmonary effort is normal.  Abdominal:     Palpations: There is no mass.      Tenderness: There is abdominal tenderness in the epigastric area and periumbilical area. There is no guarding.  Neurological:     Mental Status: She is alert and oriented to person, place, and time.  Psychiatric:        Mood and Affect: Mood normal.        Behavior: Behavior normal.        Thought Content: Thought content normal.        Judgment: Judgment normal.        No results found for any visits on 07/25/24.      Assessment & Plan:   Problem List Items Addressed This Visit       Active Problems   AP (abdominal pain) - Primary   Relevant Orders   Amylase   Lipase   US  Abdomen Complete   Diarrhea   Relevant Orders   Amylase   Lipase   US  Abdomen Complete   Assessment & Plan Chronic diarrhea with abdominal pain and nausea Severe abdominal pain, nausea, and diarrhea for 2-3 weeks. Tests for C. difficile and stool cultures negative. Minimal improvement with cholestyramine  from GI.  - Order complete abdominal ultrasound. - Check pancreatic enzyme levels. - Follow up with GI specialist on Thursday for further evaluation and biopsy results. - Encourage oral hydration    Multiple myeloma not  having achieved remission Previously in remission for five years, no treatment in the past eight months. Current symptoms not believed to be directly related to myeloma, according to oncologist.       No orders of the defined types were placed in this encounter.   Return if symptoms worsen or fail to improve.  Waddell KATHEE Mon, NP

## 2024-07-26 ENCOUNTER — Ambulatory Visit: Payer: Self-pay | Admitting: Family Medicine

## 2024-07-26 ENCOUNTER — Other Ambulatory Visit: Payer: Self-pay

## 2024-07-27 ENCOUNTER — Encounter (HOSPITAL_BASED_OUTPATIENT_CLINIC_OR_DEPARTMENT_OTHER): Payer: Self-pay

## 2024-07-27 ENCOUNTER — Encounter: Payer: Self-pay | Admitting: Hematology & Oncology

## 2024-07-27 ENCOUNTER — Emergency Department (HOSPITAL_BASED_OUTPATIENT_CLINIC_OR_DEPARTMENT_OTHER)
Admission: EM | Admit: 2024-07-27 | Discharge: 2024-07-27 | Disposition: A | Discharge status: Home / Self Care | Source: Ambulatory Visit | Attending: Emergency Medicine | Admitting: Emergency Medicine

## 2024-07-27 ENCOUNTER — Ambulatory Visit: Admitting: Student

## 2024-07-27 ENCOUNTER — Ambulatory Visit: Payer: Self-pay | Admitting: Hematology & Oncology

## 2024-07-27 ENCOUNTER — Telehealth: Payer: Self-pay | Admitting: *Deleted

## 2024-07-27 ENCOUNTER — Other Ambulatory Visit: Payer: Self-pay

## 2024-07-27 ENCOUNTER — Ambulatory Visit (INDEPENDENT_AMBULATORY_CARE_PROVIDER_SITE_OTHER): Admitting: Gastroenterology

## 2024-07-27 ENCOUNTER — Encounter: Payer: Self-pay | Admitting: Gastroenterology

## 2024-07-27 VITALS — BP 138/88 | HR 77 | Ht 66.0 in | Wt 192.2 lb

## 2024-07-27 DIAGNOSIS — R1013 Epigastric pain: Secondary | ICD-10-CM | POA: Diagnosis not present

## 2024-07-27 DIAGNOSIS — R112 Nausea with vomiting, unspecified: Secondary | ICD-10-CM | POA: Diagnosis not present

## 2024-07-27 DIAGNOSIS — Z7982 Long term (current) use of aspirin: Secondary | ICD-10-CM | POA: Insufficient documentation

## 2024-07-27 DIAGNOSIS — R197 Diarrhea, unspecified: Secondary | ICD-10-CM | POA: Diagnosis not present

## 2024-07-27 LAB — COMPREHENSIVE METABOLIC PANEL WITH GFR
ALT: 38 U/L (ref 0–44)
AST: 29 U/L (ref 15–41)
Albumin: 4.5 g/dL (ref 3.5–5.0)
Alkaline Phosphatase: 61 U/L (ref 38–126)
Anion gap: 14 (ref 5–15)
BUN: 17 mg/dL (ref 8–23)
CO2: 22 mmol/L (ref 22–32)
Calcium: 9.5 mg/dL (ref 8.9–10.3)
Chloride: 106 mmol/L (ref 98–111)
Creatinine, Ser: 0.71 mg/dL (ref 0.44–1.00)
GFR, Estimated: >60 mL/min (ref >60)
Glucose, Bld: 176 mg/dL — ABNORMAL HIGH (ref 70–99)
Potassium: 3.9 mmol/L (ref 3.5–5.1)
Sodium: 141 mmol/L (ref 135–145)
Total Bilirubin: 0.4 mg/dL (ref 0.0–1.2)
Total Protein: 7 g/dL (ref 6.5–8.1)

## 2024-07-27 LAB — URINALYSIS, ROUTINE W REFLEX MICROSCOPIC
Bilirubin Urine: NEGATIVE
Glucose, UA: NEGATIVE mg/dL
Hgb urine dipstick: NEGATIVE
Ketones, ur: 15 mg/dL — AB
Nitrite: NEGATIVE
Protein, ur: NEGATIVE mg/dL
Specific Gravity, Urine: 1.025 (ref 1.005–1.030)
pH: 5.5 (ref 5.0–8.0)

## 2024-07-27 LAB — PROTEIN ELECTROPHORESIS, SERUM, WITH REFLEX
A/G Ratio: 1.2 (ref 0.7–1.7)
Albumin ELP: 3.4 g/dL (ref 2.9–4.4)
Alpha-1-Globulin: 0.2 g/dL (ref 0.0–0.4)
Alpha-2-Globulin: 1 g/dL (ref 0.4–1.0)
Beta Globulin: 1 g/dL (ref 0.7–1.3)
Gamma Globulin: 0.6 g/dL (ref 0.4–1.8)
Globulin, Total: 2.8 g/dL (ref 2.2–3.9)
M-Spike, %: 0.3 g/dL — ABNORMAL HIGH
SPEP Interpretation: 0
Total Protein ELP: 6.2 g/dL (ref 6.0–8.5)

## 2024-07-27 LAB — URINALYSIS, MICROSCOPIC (REFLEX)

## 2024-07-27 LAB — CBC
HCT: 39.7 % (ref 36.0–46.0)
Hemoglobin: 13.2 g/dL (ref 12.0–15.0)
MCH: 30.1 pg (ref 26.0–34.0)
MCHC: 33.2 g/dL (ref 30.0–36.0)
MCV: 90.6 fL (ref 80.0–100.0)
Platelets: 195 K/uL (ref 150–400)
RBC: 4.38 MIL/uL (ref 3.87–5.11)
RDW: 12.5 % (ref 11.5–15.5)
WBC: 7.5 K/uL (ref 4.0–10.5)
nRBC: 0 % (ref 0.0–0.2)

## 2024-07-27 LAB — LIPASE, BLOOD: Lipase: 23 U/L (ref 11–51)

## 2024-07-27 LAB — IMMUNOFIXATION REFLEX, SERUM
IgA: 46 mg/dL — ABNORMAL LOW (ref 87–352)
IgG (Immunoglobin G), Serum: 736 mg/dL (ref 586–1602)
IgM (Immunoglobulin M), Srm: 27 mg/dL (ref 26–217)

## 2024-07-27 MED ORDER — SODIUM CHLORIDE 0.9 % IV BOLUS
1000.0000 mL | Freq: Once | INTRAVENOUS | Status: AC
  Administered 2024-07-27: 1000 mL via INTRAVENOUS

## 2024-07-27 MED ORDER — DIPHENOXYLATE-ATROPINE 2.5-0.025 MG PO TABS: 1.0000 | ORAL_TABLET | Freq: Two times a day (BID) | ORAL | 4 refills | Status: DC

## 2024-07-27 MED ORDER — AMITRIPTYLINE HCL 25 MG PO TABS: 25.0000 mg | ORAL_TABLET | Freq: Every day | ORAL | 0 refills | Status: DC

## 2024-07-27 NOTE — ED Provider Notes (Signed)
 [place] [facility] Provider Note   CSN: [id number] Arrival date & time: 07/27/24  1543     Patient presents with: Diarrhea   Tami [name] is a 68 y.o. female.   Patient here for IV fluids.  She has chronic diarrhea/IBS.  Was told by provider that she should get evaluated for hydration today.  She is not having any nausea or vomiting.  No abdominal pain.  No fever.  She has been dealing with chronic diarrhea for a long time.  She denies any recent antibiotics.  Denies any pain with urination.  No chest pain shortness of breath.  The history is provided by the patient.       Prior to Admission medications   Medication Sig Start Date End Date Taking? Authorizing Provider  acetaminophen  (TYLENOL ) 500 MG tablet Take 500 mg by mouth every 6 (six) hours as needed.    [provider]  acyclovir  (ZOVIRAX ) 400 MG tablet TAKE ONE (1) TABLET BY MOUTH TWO (2) TIMES DAILY 02/08/24   Timmy Maude SAUNDERS, MD  albuterol  (PROVENTIL ) (2.5 MG/3ML) 0.083% nebulizer solution Take 3 mLs (2.5 mg total) by nebulization every 6 (six) hours as needed for wheezing or shortness of breath. 12/14/23   Kara Dorn NOVAK, MD  albuterol  (VENTOLIN  HFA) 108 (90 Base) MCG/ACT inhaler Inhale 2 puffs into the lungs every 6 (six) hours as needed for wheezing or shortness of breath. 12/14/23   Kara Dorn NOVAK, MD  amitriptyline  (ELAVIL ) 25 MG tablet Take 1 tablet (25 mg total) by mouth at bedtime. 07/27/24   Charlanne Groom, MD  amLODipine  (NORVASC ) 5 MG tablet Take 5 mg by mouth daily. 09/14/23   [provider]  aspirin  EC 81 MG tablet Take 81 mg by mouth daily. Swallow whole.    [provider]  budesonide  (PULMICORT ) 0.5 MG/2ML nebulizer solution Take 2 mLs (0.5 mg total) by nebulization 2 (two) times daily as needed (cough, wheezing or shortness of breath). 12/14/23   Kara Dorn NOVAK, MD  cetirizine  (ZYRTEC ) 10 MG tablet Take 10 mg by mouth at bedtime.     [provider]  Cholecalciferol (VITAMIN D3) 50 MCG (2000 UT) TABS Take 2,000 Units by mouth in the morning.    [provider]  cholestyramine  (QUESTRAN ) 4 g packet Take 1 packet by mouth daily. Separate from other medications by at least 2 hours. Start at daily and can increase to twice daily if no improvement after 7 days. 07/03/24 06/23/26  Cirigliano, Vito V, DO  cholestyramine  light (PREVALITE ) 4 g packet Take 4 g by mouth 2 (two) times daily.    [provider]  cyclobenzaprine  (FLEXERIL ) 5 MG tablet Take 1 tablet (5 mg total) by mouth 2 (two) times daily as needed for up to 10 doses for muscle spasms. 01/03/24   Ellesse Antenucci, DO  dicyclomine  (BENTYL ) 20 MG tablet Take 1 tablet (20 mg total) by mouth 3 (three) times daily before meals. 06/13/24   May, Deanna J, NP  diphenhydrAMINE  (BENADRYL ) 25 MG tablet Take 25 mg by mouth. 01/28/22   [provider]  diphenoxylate -atropine  (LOMOTIL ) 2.5-0.025 MG tablet Take 1 tablet by mouth 2 (two) times daily. 07/27/24   Charlanne Groom, MD  estradiol  (ESTRACE ) 0.1 MG/GM vaginal cream Place 0.5 g vaginally 2 (two) times a week. Place 0.5g nightly for two weeks then twice a week after 02/03/24   Guadlupe Dull T, MD  famotidine  (PEPCID ) 20 MG tablet Take 1 tablet (20  mg total) by mouth at bedtime. 09/16/23   Domenica Harlene LABOR, MD  ferrous sulfate (FEROSUL) 325 (65 FE) MG tablet Take 1 tablet (325 mg total) by mouth daily with breakfast. 01/19/24   Domenica Harlene LABOR, MD  fluticasone  (FLONASE ) 50 MCG/ACT nasal spray Place 2 sprays into both nostrils daily. 01/07/20   Joy, Shawn C, PA-C  furosemide  (LASIX ) 20 MG tablet Take 20 mg by mouth daily as needed (fluid retention (feet swelling)). 07/03/20   [provider]  hydrOXYzine  (ATARAX ) 10 MG tablet TAKE ONE (1) TABLET BY MOUTH 3 TIMES DAILY AS NEEDED FOR ITCHING 03/03/23   Domenica Harlene LABOR, MD  lidocaine  (XYLOCAINE ) 5 % ointment Apply 1 Application topically as needed. 01/31/24   Guadlupe Lianne DASEN, MD  lidocaine -prilocaine  (EMLA ) cream Apply 1 Application topically as needed. 07/01/23   Timmy Maude SAUNDERS, MD  loperamide  (IMODIUM ) 2 MG capsule Take 4 mg by mouth in the morning and at bedtime.    [provider]  losartan  (COZAAR ) 50 MG tablet Take 50 mg by mouth in the morning and at bedtime. 04/14/21   [provider]  Magnesium  Glycinate 100 MG CAPS Take 200 mg by mouth at bedtime. 09/21/22   [provider]  meclizine  (ANTIVERT ) 12.5 MG tablet Take 12.5 mg by mouth 2 (two) times daily as needed for dizziness or nausea. 01/28/22   [provider]  metoprolol  succinate (TOPROL -XL) 50 MG 24 hr tablet Take 1 tablet (50 mg total) by mouth 2 (two) times daily. 02/23/23   Domenica Harlene LABOR, MD  montelukast  (SINGULAIR ) 10 MG tablet Take 1 tablet (10 mg total) by mouth at bedtime. Please start taking this on September 4. 07/01/23   Timmy Maude SAUNDERS, MD  Multiple Vitamins-Minerals (CENTRUM SILVER  WOMEN 50+ PO) Take 1 tablet by mouth daily at 6 (six) AM.    [provider]  neomycin -polymyxin-hydrocortisone (CORTISPORIN) OTIC solution Place 4 drops into both ears 4 (four) times daily. 05/18/24   Domenica Harlene LABOR, MD  oxyCODONE -acetaminophen  (PERCOCET) 5-325 MG tablet Take 2 tablets by mouth every 6 (six) hours as needed. 08/07/23   Patt Alm Macho, MD  pantoprazole  (PROTONIX ) 40 MG tablet Take 1 tablet (40 mg total) by mouth daily. 09/03/23   O'Sullivan, Melissa, NP  phenazopyridine  (PYRIDIUM ) 100 MG tablet Take 1 tablet (100 mg total) by mouth 3 (three) times daily as needed for pain. 07/04/24   Yacopino, Jessica L, NP  phenazopyridine  (PYRIDIUM ) 200 MG tablet Take 1 tablet (200 mg total) by mouth 3 (three) times daily as needed for pain. 01/31/24   Guadlupe Lianne DASEN, MD  potassium chloride  SA (KLOR-CON  M) 20 MEQ tablet TAKE 1 TABLET BY MOUTH EVERY MONDAY, WEDNESDAY, AND FRIDAY. 10/25/23   Domenica Harlene LABOR, MD  prochlorperazine  (COMPAZINE ) 10 MG tablet Take 0.5 tablets (5  mg total) by mouth 2 (two) times daily as needed for nausea or vomiting. 12/02/23   Domenica Harlene LABOR, MD  Respiratory Therapy Supplies (PILLOW MASK/ADULT) MISC  09/29/23   [provider]  rifaximin  (XIFAXAN ) 200 MG tablet Take 1 tablet (200 mg total) by mouth 3 (three) times daily. 07/21/24   Timmy Maude SAUNDERS, MD  scopolamine  (TRANSDERM-SCOP) 1 MG/3DAYS Place 1 patch (1.5 mg total) onto the skin every 3 (three) days. 07/01/23   Timmy Maude SAUNDERS, MD  spironolactone  (ALDACTONE ) 25 MG tablet Take 25 mg by mouth daily.    [provider]  sucralfate  (CARAFATE ) 1 GM/10ML suspension Take 10 mLs (1 g total) by  mouth 4 (four) times daily -  with meals and at bedtime. 05/18/24   Domenica Harlene LABOR, MD  Vibegron  (GEMTESA ) 75 MG TABS Take 1 tablet (75 mg total) by mouth daily. 06/15/24   Guadlupe Lianne DASEN, MD    Allergies: Benazepril , Ondansetron  hcl, Codeine, and Morphine    Review of Systems  Updated Vital Signs BP (!) 159/97 (BP Location: Left Arm)   Pulse 82   Temp 99.1 F (37.3 C)   Resp 18   Ht 5' 6 (1.676 m)   Wt 87.1 kg   SpO2 95%   BMI 30.99 kg/m   Physical Exam Vitals and nursing note reviewed.  Constitutional:      General: She is not in acute distress.    Appearance: She is well-developed. She is not ill-appearing.  HENT:     Head: Normocephalic and atraumatic.     Nose: Nose normal.     Mouth/Throat:     Mouth: Mucous membranes are moist.  Eyes:     Extraocular Movements: Extraocular movements intact.     Conjunctiva/sclera: Conjunctivae normal.     Pupils: Pupils are equal, round, and reactive to light.  Cardiovascular:     Rate and Rhythm: Normal rate and regular rhythm.     Pulses: Normal pulses.     Heart sounds: Normal heart sounds. No murmur heard. Pulmonary:     Effort: Pulmonary effort is normal. No respiratory distress.     Breath sounds: Normal breath sounds.  Abdominal:     General: Abdomen is flat.     Palpations: Abdomen is soft.     Tenderness:  There is no abdominal tenderness.  Musculoskeletal:        General: No swelling.     Cervical back: Normal range of motion and neck supple.  Skin:    General: Skin is warm and dry.     Capillary Refill: Capillary refill takes less than 2 seconds.  Neurological:     General: No focal deficit present.     Mental Status: She is alert.  Psychiatric:        Mood and Affect: Mood normal.     (all labs ordered are listed, but only abnormal results are displayed) Labs Reviewed  COMPREHENSIVE METABOLIC PANEL WITH GFR - Abnormal; Notable for the following components:      Result Value   Glucose, Bld 176 (*)    All other components within normal limits  URINALYSIS, ROUTINE W REFLEX MICROSCOPIC - Abnormal; Notable for the following components:   APPearance HAZY (*)    Ketones, ur 15 (*)    Leukocytes,Ua SMALL (*)    All other components within normal limits  URINALYSIS, MICROSCOPIC (REFLEX) - Abnormal; Notable for the following components:   Bacteria, UA FEW (*)    All other components within normal limits  LIPASE, BLOOD  CBC    EKG: None  Radiology: No results found.   Procedures   Medications Ordered in the ED  sodium chloride  0.9 % bolus 1,000 mL (1,000 mLs Intravenous New Bag/Given 07/27/24 1851)                                    Medical Decision Making Amount and/or Complexity of Data Reviewed Labs: ordered.   Tami [name] is here with concern for dehydration given ongoing diarrhea.  She has chronic diarrhea.  States IBS history.  Is not having any abdominal  pain nausea vomiting.  No recent antibiotics.  Unremarkable vitals.  Will check basic labs give IV fluids and reevaluate.  I have no concern for intra-abdominal process.  Will look for electrolyte abnormalities AKI.  Clinically she looks well.  Have no concern for other emergent process.  She is not tender in her abdomen.  I seen her several times for the same.  Had a CT scan recently that was overall  unremarkable.  Will hydrate and anticipate discharge.  Lab work is unremarkable.  No significant leukocytosis anemia or electrolyte abnormality.  Feeling better after IV fluids.  Discharge.  Follow-up with primary care.  This chart was dictated using voice recognition software.  Despite best efforts to proofread,  errors can occur which can change the documentation meaning.      Final diagnoses:  Diarrhea, unspecified type    ED Discharge Orders     None          Ruthe Cornet, DO 07/27/24 1901

## 2024-07-27 NOTE — ED Notes (Signed)
 Pt has port, but after discussion with this RN and CT Tech, Rosina, pt said that she did not want her port accessed here. This RN explained to the pt that we are certified to access ports here and use sterile procedure, but that it is the pt's choice if she would rather have an IV placed in her arm. Pt opted to have IV placed in her arm. This RN attempted x2 to place IV, without success. RN, Marolyn Hummer, at bedside to look for access per pt request to still have IV rather than have port accessed.

## 2024-07-27 NOTE — Progress Notes (Signed)
 Chief Complaint: FU  Referring Provider:  Domenica Harlene LABOR, MD      ASSESSMENT AND PLAN;   #1. Epi pain with N/V. Neg extensive eval including multiple EGDs/CT. Most recent neg EGD 07/03/2024 with neg bx.  #2. Diarrhea - getting fluids every week. Neg stool for C Diff, calprotectin. Neg colon to TI with Bx Feb 2025. No wt loss. Prev Dx IBS-D  Plan: -Stop Mg/iron  for now. -Lomotil  1 tab BID #60, 4RF -Continue bentyl  20mg  po TID -Cholestramine 4g po every day in apple sauce.  -Amitryptline 25mg  po at bedtime -If still with problems, consider changing omesartan to amolidipine (olmesartan may cause symptoms similar to celiac). -Continue Zofran  4mg  po Q8hrs prn. -FU in 12 weeks. 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 history of small bowel obstruction secondary to adhesions, hysterectomy, cholecystectomy.  who presents with abdominal pain and diarrhea.  She experiences significant abdominal pain located in the upper abdomen, accompanied by nausea and vomiting.  Frequent visits to ED and is status post extensive GI workup as detailed below without any definite etiology.  She has been getting IV fluids frequently at cancer center as well.  She has severe diarrhea that has been ongoing, and reports receiving weekly fluids for dehydration and weakness. Cramping and nausea occur prior to episodes of diarrhea, which happen multiple times a day, including three times before today's visit. During these episodes, she experiences weakness in vision and sweating. Post-diarrhea, she feels weak and needs to rest.  She has a history of multiple myeloma, with her last treatment on November 26, 2023. She has not received treatment for eight  months.  Her current medications include acyclovir , cetafen, aldactone , dicyclomine , potassium, metoprolol , Tylenol , allergen. She takes dicyclomine  for spasms, which she feels works, but continues to experience diarrhea. She also takes cholestyramine , mixed with water or apple juice, but is unsure of its effectiveness.  She drinks a lot of water and Gator Light for hydration.    Wt Readings from Last 3 Encounters:  07/27/24 192 lb 4 oz (87.2 kg)  07/25/24 193 lb (87.5 kg)  07/25/24 192 lb (87.1 kg)    Previous GI Endoscopies / Labs / Imaging:   EGD 07/03/2024 - Normal esophagus. - Z- line regular, 37 cm from the incisors. - Normal stomach. Biopsied. - Gastroesophageal flap valve classified as Hill Grade I ( prominent fold, tight to endoscope) . - Normal examined duodenum. Biopsied. - Made several passes through the esophagus, stomach, and proximal small bowel. No areas of inflammation or obstruction. Pylorus is widely patent and easily traversable. -Negative small bowel biopsies for celiac, negative gastric biopsies for H. pylori.    EGD/colonoscopy 12/13/23: Esophagus: The GE junction,diaphragm impression and top of gastric folds were ~ 42 cm from incisors. There was no evidence of esophagitis, Barrett's,varices,Schatzki ring or stricture.  Stomach: Normal examination of the stomach. On retroflexion, hiatus hill grade was 1.  Duodenum: Normal examination of visualized portions of duodenum. We obtained duodenal biopsies to evaluate for celiac disease.  External and internal medium hemorrhoids Diverticula of mild severity Otherwise, normal examination of cecum,ascending,transverse,descending,sigmoid colon and rectum. Terminal ileum was intubated and distal 10 cm appeared normal. We took random colon biopsies to evaluate for microscopic colitis. Final  Diagnosis  A. TISSUE LABELED DUODENUM, BIOPSIES: SMALL BOWEL MUCOSA SHOWING NO SIGNIFICANT HISTOPATHOLOGIC CHANGES. THE FEATURES OF  CELIAC SPRUE ARE NOT IDENTIFIED.  B. TISSUE LABELED RANDOM COLON BIOPSY, BIOPSIES: COLONIC MUCOSA SHOWING NO SIGNIFICANT HISTOPATHOLOGIC CHANGES.     EGD and colonoscopy October 2023 EGD -LA grade a reflux esophagitis.  Biopsied.  Normal stomach.  Biopsied.  Normal examined duodenum.  Biopsied. Colonoscopy  -Entire examined colon normal.  Biopsied.  Minimal sigmoid diverticulosis.  Examined portion of the ileum was normal.  Deep terminal ileal intubation was performed.  Biopsy.  Nonbleeding internal hemorrhoids.  Examination was otherwise normal..    Postprocedure she was given a trial of Bentyl .  Symptoms felt to be anxiety related   FINAL MICROSCOPIC DIAGNOSIS:  A. SMALL BOWEL, BIOPSY: - Benign small bowel mucosa with no significant pathologic changes  B. STOMACH, BIOPSY: - Gastric antral and oxyntic mucosa with no specific pathologic changes - Negative for H. pylori on HE stain - Negative for intestinal metaplasia or malignancy  C. ESOPHAGUS, DISTAL, BIOPSY: - Benign squamous mucosa with reflux changes - Gastric cardiac mucosa with no specific pathologic changes - Negative for intestinal metaplasia, dysplasia or malignancy  D. TERMINAL ILEUM, BIOPSY: - Benign small bowel mucosa with no significant pathologic changes  E. COLON, RANDOM, BIOPSY: - Benign colonic mucosa with no specific pathologic changes - Negative for increased intraepithelial lymphocytes or thickened subepithelial collagen table - Negative for dysplasia or malignancy      Imaging:   CTA A/P oi7/2025 IMPRESSION: 1. No evidence of mesenteric ischemia. 2. No acute abnormality. 3. Borderline cardiomegaly.    03/02/24 CTAP IMPRESSION: 1. No specific lytic lesions characteristic of myeloma identified. 2. There is some formed stool in relatively small caliber distal colon. No dilated bowel. 3. Mild cardiomegaly. 4. 7 mm degenerative anterolisthesis at L4-5 with resulting mild bilateral foraminal  stenosis at L4-5. 5. Moderate degenerative chondral thinning in both hips. 05/20/24 CT Angio Abd IMPRESSION: 1. No evidence of mesenteric ischemia. 2. No acute abnormality. 3. Borderline cardiomegaly.   12/27/2023 US  abdomen pelvic Doppler No evidence of any critical stenosis throughout celiac, hepatic, splenic, superior and inferior mesenteric arteries. Study does not exclude embolic or branch level disease.    CT CAP with contrast 12/05/23: 1. No acute abnormality identified within the chest. No acute inflammatory process identified within the abdomen or pelvis. 2. No metastatic disease identified within the chest, abdomen or pelvis. 3. Multiple other nonacute observations, as described above.  CTAP with contrast 11/05/23: No cause can be seen for the upper abdominal pain. No acute finding.   CTAP with contrast 10/18/23: 1. No acute abnormality. No evidence of bowel obstruction or acute  bowel inflammation. Normal appendix.  2. Minimal left colonic diverticulosis.  3. Finely irregular liver surface, cannot exclude cirrhosis. Suggest  correlation with liver function tests.  4. Mild cardiomegaly.   CT head wo contrast 09/22/23: No evidence of acute intracranial abnormality.   CTA chest 09/18/23: 1. No evidence of pulmonary embolism or acute cardiopulmonary disease. 2. Mild cardiomegaly.  CTAP with contrast 08/16/23: 1. No acute CT findings of the abdomen or pelvis to explain pain or diarrhea. 2. Status post cholecystectomy and hysterectomy. 3. Pelvic floor prolapse. 4. No evidence of lymphadenopathy or metastatic disease in the abdomen or pelvis.   03/25/2023 US  abd complete 1. Evidence of hepatic steatosis. 2. Prior cholecystectomy without evidence of biliary ductal dilatation. 3. Benign-appearing hepatic and bilateral renal cysts.  Past Medical History:  Diagnosis Date  Abnormal SPEP 07/26/2016   Arthritis    knees, hands   Back pain 07/14/2016   Bowel obstruction  (HCC) 11/2014   C. 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   Multiple myeloma (HCC) 07/20/2018   Multiple myeloma not having achieved remission (HCC) 07/20/2018   Myalgia 12/31/2016   Obstructive sleep apnea 03/31/2015   Presence of permanent cardiac pacemaker    SOB (shortness of breath) 04/09/2016   Stroke (HCC)    TIAs   TIA (transient ischemic attack)    UTI (urinary tract infection)     Past Surgical History:  Procedure Laterality Date   ABDOMINAL HYSTERECTOMY     menorraghia, 2006, total   BIOPSY  09/01/2022   Procedure: BIOPSY;  Surgeon: Charlanne Groom, MD;  Location: [facility];  Service: Gastroenterology;;   CHOLECYSTECTOMY     COLONOSCOPY  2018   [facility] per patient   COLONOSCOPY WITH PROPOFOL  N/A 09/01/2022   Procedure: COLONOSCOPY WITH PROPOFOL ;  Surgeon: Charlanne Groom, MD;  Location: [facility];  Service: Gastroenterology;  Laterality: N/A;   ESOPHAGOGASTRODUODENOSCOPY  2018   [facility]    ESOPHAGOGASTRODUODENOSCOPY N/A 07/03/2024   Procedure: EGD (ESOPHAGOGASTRODUODENOSCOPY);  Surgeon: San Sandor GAILS, DO;  Location: [facility];  Service: Gastroenterology;  Laterality: N/A;   ESOPHAGOGASTRODUODENOSCOPY (EGD) WITH PROPOFOL  N/A 09/01/2022   Procedure: ESOPHAGOGASTRODUODENOSCOPY (EGD) WITH PROPOFOL ;  Surgeon: Charlanne Groom, MD;  Location: [facility];  Service: Gastroenterology;  Laterality: N/A;   IR IMAGING GUIDED PORT INSERTION  06/29/2023   KNEE SURGERY     right, repair  torn torn cartialage   PACEMAKER INSERTION  08/2014   changed last in 2023 per pt at [facility]   TONSILLECTOMY     TUBAL LIGATION      Family History  Problem Relation Age of Onset   Diabetes Mother    Hypertension Mother    Heart disease Mother        s/p 1 stent   Hyperlipidemia Mother    Arthritis Mother    Colon cancer Father 73   Irritable bowel syndrome Sister    Hypertension Maternal Grandmother    Arthritis Maternal Grandmother    Heart disease Maternal Grandfather        MI   Hypertension Maternal Grandfather    Arthritis Maternal Grandfather    Hyperlipidemia Daughter    Hypertension Daughter    Hypertension Son    Esophageal cancer Neg Hx    Uterine cancer Neg Hx    Pancreatic cancer Neg Hx     Social History   Tobacco Use   Smoking status: Never    Passive exposure: Never   Smokeless tobacco: Never  Vaping Use   Vaping status: Never Used  Substance Use Topics   Alcohol use: No   Drug use: No    Current Outpatient Medications  Medication Sig Dispense Refill   acetaminophen  (TYLENOL ) 500 MG tablet Take 500 mg by mouth every 6 (six) hours as needed.     acyclovir  (ZOVIRAX ) 400  MG tablet TAKE ONE (1) TABLET BY MOUTH TWO (2) TIMES DAILY 60 tablet 3   albuterol  (PROVENTIL ) (2.5 MG/3ML) 0.083% nebulizer solution Take 3 mLs (2.5 mg total) by nebulization every 6 (six) hours as needed for wheezing or shortness of breath. 150 mL 2   albuterol  (VENTOLIN  HFA) 108 (90 Base) MCG/ACT inhaler Inhale 2 puffs into the lungs every 6 (six) hours as needed for wheezing or shortness of breath. 18 g 5   amLODipine  (NORVASC ) 5 MG tablet Take 5 mg by mouth daily.     aspirin  EC 81 MG tablet Take 81 mg by mouth daily. Swallow whole.     budesonide  (PULMICORT ) 0.5 MG/2ML nebulizer solution Take 2 mLs (0.5 mg total) by nebulization 2 (two) times daily as needed (cough, wheezing or shortness of breath). 120 mL 11   cetirizine  (ZYRTEC ) 10 MG tablet Take 10 mg by mouth at bedtime.      Cholecalciferol (VITAMIN D3) 50 MCG (2000 UT) TABS Take 2,000 Units by mouth in the morning.     cholestyramine  (QUESTRAN ) 4 g packet Take 1 packet by mouth daily. Separate from other medications by at least 2 hours. Start at daily and can increase to twice daily if no improvement after 7 days. 60 each 11   cholestyramine  light (PREVALITE ) 4 g packet Take 4 g by mouth 2 (two) times daily.     cyclobenzaprine  (FLEXERIL ) 5 MG tablet Take 1 tablet (5 mg total) by mouth 2 (two) times daily as needed for up to 10 doses for muscle spasms. 10 tablet 0   dicyclomine  (BENTYL ) 20 MG tablet Take 1 tablet (20 mg total) by mouth 3 (three) times daily before meals. 90 tablet 2   diphenhydrAMINE  (BENADRYL ) 25 MG tablet Take 25 mg by mouth.     estradiol  (ESTRACE ) 0.1 MG/GM vaginal cream Place 0.5 g vaginally 2 (two) times a week. Place 0.5g nightly for two weeks then twice a week after 30 g 3   famotidine  (PEPCID ) 20 MG tablet Take 1 tablet (20 mg total) by mouth at bedtime. 30 tablet 2   ferrous sulfate (FEROSUL) 325 (65 FE) MG tablet Take 1 tablet (325 mg total) by mouth daily with breakfast. 90 tablet 0   fluticasone  (FLONASE ) 50 MCG/ACT nasal spray Place 2 sprays into both nostrils daily. 16 g 0   furosemide  (LASIX ) 20 MG tablet Take 20 mg by mouth daily as needed (fluid retention (feet swelling)).     hydrOXYzine  (ATARAX ) 10 MG tablet TAKE ONE (1) TABLET BY MOUTH 3 TIMES DAILY AS NEEDED FOR ITCHING 30 tablet 2   lidocaine  (XYLOCAINE ) 5 % ointment Apply 1 Application topically as needed. 35.44 g 0   lidocaine -prilocaine  (EMLA ) cream Apply 1 Application topically as needed. 30 g 0   loperamide  (IMODIUM ) 2 MG capsule Take 4 mg by mouth in the morning and at bedtime.     losartan  (COZAAR ) 50 MG tablet Take 50 mg by mouth in the morning and at bedtime.     Magnesium  Glycinate 100 MG CAPS Take 200 mg by mouth at bedtime.     meclizine  (ANTIVERT ) 12.5 MG tablet Take 12.5 mg by mouth 2 (two) times daily as needed  for dizziness or nausea.     metoprolol  succinate (TOPROL -XL) 50 MG 24 hr tablet Take 1 tablet (50 mg total) by mouth 2 (two) times daily. 60 tablet 2   montelukast  (SINGULAIR ) 10 MG tablet Take 1 tablet (10 mg total) by mouth at bedtime. Please start taking  this on September 4. 30 tablet 3   Multiple Vitamins-Minerals (CENTRUM SILVER  WOMEN 50+ PO) Take 1 tablet by mouth daily at 6 (six) AM.     neomycin -polymyxin-hydrocortisone (CORTISPORIN) OTIC solution Place 4 drops into both ears 4 (four) times daily. 10 mL 4   oxyCODONE -acetaminophen  (PERCOCET) 5-325 MG tablet Take 2 tablets by mouth every 6 (six) hours as needed. 10 tablet 0   pantoprazole  (PROTONIX ) 40 MG tablet Take 1 tablet (40 mg total) by mouth daily. 30 tablet 3   phenazopyridine  (PYRIDIUM ) 100 MG tablet Take 1 tablet (100 mg total) by mouth 3 (three) times daily as needed for pain. 10 tablet 0   phenazopyridine  (PYRIDIUM ) 200 MG tablet Take 1 tablet (200 mg total) by mouth 3 (three) times daily as needed for pain. 30 tablet 0   potassium chloride  SA (KLOR-CON  M) 20 MEQ tablet TAKE 1 TABLET BY MOUTH EVERY MONDAY, WEDNESDAY, AND FRIDAY. 30 tablet 5   prochlorperazine  (COMPAZINE ) 10 MG tablet Take 0.5 tablets (5 mg total) by mouth 2 (two) times daily as needed for nausea or vomiting. 60 tablet 1   Respiratory Therapy Supplies (PILLOW MASK/ADULT) MISC      rifaximin  (XIFAXAN ) 200 MG tablet Take 1 tablet (200 mg total) by mouth 3 (three) times daily. 42 tablet 1   scopolamine  (TRANSDERM-SCOP) 1 MG/3DAYS Place 1 patch (1.5 mg total) onto the skin every 3 (three) days. 10 patch 1   spironolactone  (ALDACTONE ) 25 MG tablet Take 25 mg by mouth daily.     sucralfate  (CARAFATE ) 1 GM/10ML suspension Take 10 mLs (1 g total) by mouth 4 (four) times daily -  with meals and at bedtime. 473 mL 1   Vibegron  (GEMTESA ) 75 MG TABS Take 1 tablet (75 mg total) by mouth daily. 30 tablet 2   Current Facility-Administered Medications  Medication Dose Route  Frequency Provider Last Rate Last Admin   nitroGLYCERIN  (NITROSTAT ) SL tablet 0.4 mg  0.4 mg Sublingual Once Blyth, Stacey A, MD       Facility-Administered Medications Ordered in Other Visits  Medication Dose Route Frequency Provider Last Rate Last Admin   acetaminophen  (TYLENOL ) tablet 650 mg  650 mg Oral Once Ennever, Peter R, MD       sodium chloride  flush (NS) 0.9 % injection 10 mL  10 mL Intracatheter PRN Timmy Maude SAUNDERS, MD   10 mL at 09/24/23 1640    Allergies  Allergen Reactions   Benazepril  Anaphylaxis, Swelling and Hives    angioedema Throat and lip swelling   Ondansetron  Hcl Hives    Redness and hives post IV admin on 07/05/17   Codeine Nausea And Vomiting   Morphine Hives    Redness and hives noted post IV admin on 07/05/17    Review of Systems:  neg     Physical Exam:    BP 138/88 (BP Location: Left Arm, Patient Position: Sitting, Cuff Size: Normal)   Pulse 77   Ht 5' 6 (1.676 m)   Wt 192 lb 4 oz (87.2 kg)   BMI 31.03 kg/m  Wt Readings from Last 3 Encounters:  07/27/24 192 lb 4 oz (87.2 kg)  07/25/24 193 lb (87.5 kg)  07/25/24 192 lb (87.1 kg)   Constitutional:  Well-developed, in no acute distress. Psychiatric: Normal mood and affect. Behavior is normal. HEENT: Pupils normal.  Conjunctivae are normal. No scleral icterus. Cardiovascular: Normal rate, regular rhythm. No edema Pulmonary/chest: Effort normal and breath sounds normal. No wheezing, rales or rhonchi. Abdominal: Soft, nondistended.  Nontender. Bowel sounds active throughout. There are no masses palpable. No hepatomegaly. Rectal: Deferred Neurological: Alert and oriented to person place and time. Skin: Skin is warm and dry. No rashes noted.  Data Reviewed: I have personally reviewed following labs and imaging studies  CBC:    Latest Ref Rng & Units 07/21/2024    8:38 AM 07/02/2024    5:54 PM 06/23/2024    9:45 AM  CBC  WBC 4.0 - 10.5 K/uL 4.2  4.6  4.2   Hemoglobin 12.0 - 15.0 g/dL 87.9   87.3  87.8   Hematocrit 36.0 - 46.0 % 36.6  38.8  37.0   Platelets 150 - 400 K/uL 157  183  145     CMP:    Latest Ref Rng & Units 07/21/2024    8:38 AM 07/02/2024    5:54 PM 06/23/2024    9:45 AM  CMP  Glucose 70 - 99 mg/dL 889  880  888   BUN 8 - 23 mg/dL 14  19  15    Creatinine 0.44 - 1.00 mg/dL 9.39  9.27  9.40   Sodium 135 - 145 mmol/L 141  144  143   Potassium 3.5 - 5.1 mmol/L 4.4  4.1  4.0   Chloride 98 - 111 mmol/L 107  107  108   CO2 22 - 32 mmol/L 23  24  23    Calcium 8.9 - 10.3 mg/dL 9.4  9.7  9.3   Total Protein 6.5 - 8.1 g/dL 6.7  6.8  6.7   Total Bilirubin 0.0 - 1.2 mg/dL 0.5  0.3  0.6   Alkaline Phos 38 - 126 U/L 51  58  57   AST 15 - 41 U/L 28  20  22    ALT 0 - 44 U/L 24  19  29      GFR: Estimated Creatinine Clearance: 72.8 mL/min (by C-G formula based on SCr of 0.6 mg/dL). Liver Function Tests: Recent Labs  Lab 07/21/24 0838  AST 28  ALT 24  ALKPHOS 51  BILITOT 0.5  PROT 6.7  ALBUMIN 4.3   Recent Labs  Lab 07/25/24 1140  LIPASE 16.0  AMYLASE 54      Anselm Bring, MD 07/27/2024, 2:01 PM  Cc: Domenica Harlene LABOR, MD

## 2024-07-27 NOTE — ED Triage Notes (Signed)
 Patient here POV from Home.  Endorses Diarrhea for 2 years. Seen by GI Provider today and was recommended for IV hydration. No N/V. No known fever today. No Pain.  NAD Noted during Triage. A&Ox4. Gcs 15. Ambulatory.

## 2024-07-27 NOTE — Telephone Encounter (Signed)
 Today noted a 07/25/2024 email from satellite reporting a prior authorization for Xifaxan  is needed.  Searched patient on CoverMyMeds to find key, confirmed this is our patient opened request to begin.    Medication Prior Authorization Status  Processed CoverMyMeds KEY: [id number] for Xifaxan  200MG  tablets  Submitted and approved today.  Per [employer] Medicare Part D Electronic Prior Authorization Form  PA Case ID: #: [id number]; Rx #: [id number]   Effective 07/27/2024 through 08/10/2024.

## 2024-07-27 NOTE — Patient Instructions (Addendum)
 _______________________________________________________  If your blood pressure at your visit was 140/90 or greater, please contact your primary care physician to follow up on this.  _______________________________________________________  If you are age 70 or older, your body mass index should be between 23-30. Your Body mass index is 31.03 kg/m. If this is out of the aforementioned range listed, please consider follow up with your Primary Care Provider.  If you are age 63 or younger, your body mass index should be between 19-25. Your Body mass index is 31.03 kg/m. If this is out of the aformentioned range listed, please consider follow up with your Primary Care Provider.   ________________________________________________________  The [facility] providers would like to encourage you to use MYCHART to communicate with providers for non-urgent requests or questions.  Due to long hold times on the telephone, sending your provider a message by [facility] may be a faster and more efficient way to get a response.  Please allow 48 business hours for a response.  Please remember that this is for non-urgent requests.  _______________________________________________________  [facility] is using a team-based approach to care.  Your team is made up of your doctor and two to three APPS. Our APPS (Nurse Practitioners and Physician Assistants) work with your physician to ensure care continuity for you. They are fully qualified to address your health concerns and develop a treatment plan. They communicate directly with your gastroenterologist to care for you. Seeing the Advanced Practice Practitioners on your physician's team can help you by facilitating care more promptly, often allowing for earlier appointments, access to diagnostic testing, procedures, and other specialty referrals.   Stop magnesium  and iron  tablet for now  Continue bentyl  20 3 times a day and Questran  4g daily. Please take the  questran  at least 2 hours before or after all other medications. Zofran  4mg  every 8 hours as needed  We have sent the following medications to your pharmacy for you to pick up at your convenience: Elavil  25mg  at bedtime  A script of lomotil  has been given to you  to take the the pharmacy  Please follow up in 3 months. Give us  a call at [phone] to schedule an appointment. Please call us  a the end of September to schedule a December appointment  Thank you,  Dr. Lynnie Bring

## 2024-07-28 ENCOUNTER — Other Ambulatory Visit (HOSPITAL_BASED_OUTPATIENT_CLINIC_OR_DEPARTMENT_OTHER)

## 2024-07-30 DIAGNOSIS — C9001 Multiple myeloma in remission: Secondary | ICD-10-CM | POA: Diagnosis not present

## 2024-07-30 DIAGNOSIS — K580 Irritable bowel syndrome with diarrhea: Secondary | ICD-10-CM | POA: Diagnosis not present

## 2024-07-30 DIAGNOSIS — B350 Tinea barbae and tinea capitis: Secondary | ICD-10-CM | POA: Diagnosis not present

## 2024-07-30 DIAGNOSIS — Z8619 Personal history of other infectious and parasitic diseases: Secondary | ICD-10-CM | POA: Diagnosis not present

## 2024-07-30 DIAGNOSIS — N3090 Cystitis, unspecified without hematuria: Secondary | ICD-10-CM | POA: Diagnosis not present

## 2024-07-30 DIAGNOSIS — R197 Diarrhea, unspecified: Secondary | ICD-10-CM | POA: Diagnosis not present

## 2024-07-31 ENCOUNTER — Ambulatory Visit (HOSPITAL_BASED_OUTPATIENT_CLINIC_OR_DEPARTMENT_OTHER)

## 2024-08-01 DIAGNOSIS — R197 Diarrhea, unspecified: Secondary | ICD-10-CM | POA: Diagnosis not present

## 2024-08-01 NOTE — Telephone Encounter (Signed)
 Subject:   Background:   Presented to ED 9/21 with abdominal pain, diarrhea and dysuria.  Paroxsymal Afib, LBBB, Chronic combined systolic and diastolic CHF.  Assessment Findings:  Consent to share PHI has been confirmed for sister and POA [employer]. Mrs Perley is an attorney and currently in court, she asks for patient to have an appt and be contacted to inform of concerns. Patient contacted and informs chest tightness began over the weekend and self resolves when anxiety lowered and she rests or sleeps.  She reports having a great deal of stress. She feels dehydrated and has been to ED several times requiring fluids for continued diarrhea. She's somewhat tearful and states she's unsure if the diarrhea/dehydration has anything to do with the chest tightness. She's informed of her sister's request and asks that her POA be contacted for appt arrangements.   Recommendations:  Referral to scheduling to contact POA for appt arrangements.

## 2024-08-01 NOTE — ED Triage Notes (Addendum)
 Pt reports lower back pain with diarrhea and generalized weakness. Pt reports nausea and 4 episodes of diarrhea. Pt seen GI last week and adjusted medications. Hx of multiple myeloma

## 2024-08-01 NOTE — ED Provider Notes (Signed)
 Patient placed in First Look pathway, seen and evaluated for chief complaint of diarrhea, lumbar pain, and generalized weakness.  Concerned about dehydration..   Pertinent exam findings include non-toxic in appearance and speech is clear. Based on initial evaluation, labs are currently indicated and radiology studies are not currently indicated as allowed for current processes and treatments as applicable in a triage setting and could be different than if patient were seen in a main treatment area or dependent on labs/imagining after results are displayed.  Patient counseled on process, plan, and necessity for staying for completing the evaluation.   This document serves as a record of services personally performed by Charley Herlene RIGGERS  Emergency Department Provider Note   History   Chief Complaint  Patient presents with  . Diarrhea      History provided by:  Patient Diarrhea Quality:  Semi-solid Severity:  Unable to specify Timing:  Intermittent Progression:  Unchanged Relieved by:  Nothing Worsened by:  Nothing Ineffective treatments:  Anti-motility medications Risk factors: recent antibiotic use      Past Medical History Medical History[1]   Past Surgical History Surgical History[2]    Medications Current Outpatient Medications  Medication Instructions  . acetaminophen  (TYLENOL ) 500 mg, Every 6 hours PRN  . acyclovir  (ZOVIRAX ) 400 mg, 2 times daily  . albuterol  HFA (PROVENTIL  HFA;VENTOLIN  HFA;PROAIR  HFA) 90 mcg/actuation inhaler 2 puffs, Every 6 hours PRN  . aspirin  81 mg, Daily  . budesonide  (PULMICORT ) 0.5 mg, 2 times daily PRN  . cetirizine  (ZYRTEC ) 10 mg, Daily PRN  . cholecalciferol (VITAMIN D3) 1,000 Units, oral, Daily  . cyclobenzaprine  (FLEXERIL ) 5 mg, 2 times daily PRN  . diclofenac sodium (VOLTAREN) 2 g, topical, As needed  . dicyclomine  (BENTYL ) 10 mg, Every 6 hours PRN  . diphenhydrAMINE  (BENADRYL ) 25 mg, 3 times daily PRN  . diphenoxylate -atropine   (LOMOTIL ) 2.5-0.025 mg per tablet 2 tablets, oral, 4 times daily  . estradioL  (ESTRACE ) 0.5 g, Daily  . facial mask misc . Provide PAP supplies (expires 12 months from 09/21/2023) .  . famotidine  (PEPCID ) 20 mg tablet 1 tablet, At bedtime  . ferrous sulfate 325 mg, Daily with breakfast  . fluconazole  (DIFLUCAN ) 150 mg, Once  . fluticasone  propionate (FLONASE ) 50 mcg/spray nasal spray 1 spray, Each Nostril, As needed  . furosemide  (LASIX ) 20 mg, oral, Daily PRN  . Gemtesa  75 mg, Daily  . hyoscyamine  (LEVSIN /SL) 0.125 mg, Every 4 hours PRN  . lidocaine  (XYLOCAINE ) 5 % ointment 1 Application, Daily PRN  . losartan  (COZAAR ) 50 mg tablet TAKE ONE (1) TABLET BY MOUTH TWO (2) TIMES DAILY  . MAGNESIUM  GLYCINATE ORAL 2 tablets, Nightly  . meclizine  (ANTIVERT ) 12.5 mg, 3 times daily PRN  . metoprolol  succinate (TOPROL  XL) 100 mg, oral, Daily  . miscellaneous medical supply (C-Tub) misc Provide PAP supplies (expires 12 months from 09/16/2022).  . multivitamin/iron /folic acid  (CENTRUM WOMEN ORAL) 1 tablet, Daily  . phenazopyridine  (PYRIDIUM ) 200 mg, 3 times daily with meals  . potassium chloride  (KLOR-CON ) 20 mEq ER tablet 20 mEq, 3 times weekly MWF  . promethazine  (PHENERGAN ) 25 mg, Every 8 hours PRN  . saccharomyces boulardii (FLORASTOR) 250 mg, Daily  . spironolactone  (ALDACTONE ) 25 mg, oral, Daily  . sucralfate  (CARAFATE ) 1 g, 3 times daily PRN  . sucralfate  (CARAFATE ) 1 g      Allergies Allergies[3]   Family History Family History[4]   Social History Social History   Socioeconomic History  . Marital status: Single    Spouse name:  [name]  . Number of children: [name]  . Years of education: [name]  . Highest education level: [name]  Occupational History  . [name]  Tobacco Use  . Smoking status: Never  . Smokeless tobacco: Never  Substance and Sexual Activity  . Alcohol use: No  . Drug use: No  . Sexual activity: [name]  Other Topics Concern   . [name]  Social History Narrative   ** Merged History Encounter **       Social Drivers of Health   Food Insecurity: No Food Insecurity (07/25/2024)   Received from [facility]   Food vital sign   . Within the past 12 months, you worried that your food would run out before you got money to buy more: Never true   . Within the past 12 months, the food you bought just didn't last and you didn't have money to get more: Never true  Transportation Needs: No Transportation Needs (07/25/2024)   Received from [facility] - Transportation   . In the past 12 months, has lack of transportation kept you from medical appointments or from getting medications?: No   . In the past 12 months, has lack of transportation kept you from meetings, work, or from getting things needed for daily living?: No  Safety: Not At Risk (07/25/2024)   Received from [facility]   Safety   . Within the last year, have you been afraid of your partner or ex-partner?: No   . Within the last year, have you been humiliated or emotionally abused in other ways by your partner or ex-partner?: No   . Within the last year, have you been kicked, hit, slapped, or otherwise physically hurt by your partner or ex-partner?: No   . Within the last year, have you been raped or forced to have any kind of sexual activity by your partner or ex-partner?: No  Living Situation: Unknown (07/25/2024)   Received from [facility] Situation   . In the last 12 months, was there a time when you were not able to pay the mortgage or rent on time?: No   . Number of Times Moved in the Last Year: [name]   . At any time in the past 12 months, were you homeless or living in a shelter (including now)?: No      Review of Systems  Review of Systems  Gastrointestinal:  Positive for diarrhea.     Physical Exam   ED Triage Vitals  Temp 08/01/24 1655 98.3 F (36.8 C)  Heart Rate 08/01/24 1655 88  Resp 08/01/24 1655 18  BP  08/01/24 1655 133/88  MAP (mmHg) 08/01/24 2039 134  SpO2 08/01/24 1655 97 %  O2 Device 08/01/24 1655 None (Room air)  O2 Flow Rate (L/min) --   Weight 08/01/24 1655 86.2 kg (190 lb)    Physical Exam Constitutional:      General: She is not in acute distress.    Appearance: Normal appearance.  HENT:     Head: Atraumatic.     Mouth/Throat:     Mouth: Mucous membranes are moist.  Eyes:     Extraocular Movements: Extraocular movements intact.  Cardiovascular:     Rate and Rhythm: Normal rate and regular rhythm.  Pulmonary:     Effort: Pulmonary effort is normal. No respiratory distress.  Abdominal:     General: Abdomen is  flat. There is no distension.     Palpations: Abdomen is soft.     Tenderness: There is no abdominal tenderness. There is no guarding or rebound.  Musculoskeletal:        General: No deformity.     Cervical back: Normal range of motion.  Skin:    General: Skin is warm and dry.  Neurological:     Mental Status: She is alert and oriented to person, place, and time. Mental status is at baseline.  Psychiatric:        Mood and Affect: Mood normal.     Labs   Abnormal Labs Reviewed  COMPREHENSIVE METABOLIC PANEL - Abnormal; Notable for the following components:      Result Value   Potassium 4.8 (*)    Chloride 108 (*)    Anion Gap 4 (*)    Glucose, Random 112 (*)    All other components within normal limits  CBC WITH DIFFERENTIAL - Abnormal; Notable for the following components:   Mean Corpuscular Hemoglobin Conc (MCHC) 32.1 (*)    Platelet Count (PLT) 149 (*)    All other components within normal limits    Labs independently reviewed by myself and considered in medical decision making.  Radiology  None  Procedure Note  Procedures  Medical Decision Making   Medical Decision Making Patient presents for evaluation of ongoing diarrhea.  This has been a an ongoing problem for several months.  I saw her 2 days ago.  At that time, she had a UA  concerning for UTI.  She was started on Keflex .  She has not had any change in the diarrhea.  Today, she is overall well-appearing.  Vital signs are reassuring.  Labs were obtained in the first look process.  I have reviewed her labs.  She does not appear to be significantly dehydrated.  I do not believe she needs any IV fluids at this time.  I also reviewed her recent urine culture from 2 days ago.  Fortunately, the culture is negative so she can stop the antibiotics.  Based on her recent GI pathogen panel and C. difficile studies, I do not believe she needs to have these repeated at this time.  She has not had any significant change in her diarrhea or new abdominal pain or fever and her white blood cell count is normal.  Therefore, very low likelihood of C. difficile.  Plan to follow-up with GI.  Return precautions discussed in detail.  Problems Addressed: Diarrhea, unspecified type: complicated acute illness or injury  Amount and/or Complexity of Data Reviewed Labs: ordered.     ED Clinical Impression   1. Diarrhea, unspecified type      ED Disposition     ED Disposition  Discharge   Condition  Stable   Comment  --           Current Discharge Medication List        FOLLOW UP Harlene Horton, MD [address] [place] [address] [phone]   As needed  [facility] -  EMERGENCY DEPARTMENT [address] [place]  [address] [phone]  As needed, If symptoms worsen        [1] Past Medical History: Diagnosis Date  . Abnormal SPEP    Follows with hematology  . Anxiety and depression   . Arthritis, multiple joint involvement   . Biventricular ICD (implantable cardioverter-defibrillator) in place   .  Cardiomyopathy    (CMD)   . Chronic headache disorder   . Chronic seasonal allergic rhinitis due to pollen   . Clostridium difficile infection   . Congestive heart  failure    (CMD)   . Constipation   . COVID-19 08/29/2020   monoclo  . Dysphagia   . Esophageal stricture    07/2017, dilated  . ETD (Eustachian tube dysfunction), bilateral   . GERD (gastroesophageal reflux disease)   . Hirsutism   . Hx of small bowel obstruction   . Hyperlipidemia   . Hypernatremia   . Hypertension   . Hypogammaglobulinemia (CMD)   . Hypokalemia   . Hypomagnesemia   . IFG (impaired fasting glucose)   . Insomnia   . Iron  deficiency anemia   . LBBB (left bundle branch block)   . Macular scar of right eye   . Monoclonal gammopathy of unknown significance (MGUS)   . Multiple myeloma    (CMD)   . Multiple myeloma (HCC) 07/20/2018  . Ocular migraine   . OSA (obstructive sleep apnea)    Using CPAP machine.  . OSA on CPAP   . Osteoporosis   . Pancytopenia (CMD)   . Paresthesias   . Peripheral neuropathy   . Sensorineural hearing loss (SNHL) of both ears   . Shift work sleep disorder   . Thyroid  nodule   . TIA (transient ischemic attack)   . Tinnitus of both ears   . VAIN (vaginal intraepithelial neoplasia)   . Vitamin D  deficiency   [2] Past Surgical History: Procedure Laterality Date  . CARDIAC PACEMAKER PLACEMENT  10/07/14   Procedure: CARDIAC PACEMAKER PLACEMENT  . CARDIAC PACEMAKER PLACEMENT  09/06/14   Procedure: CARDIAC PACEMAKER PLACEMENT; Demand Pacemaker  . CHOLECYSTECTOMY     Procedure: CHOLECYSTECTOMY  . COLONOSCOPY  06/2017   Procedure: COLONOSCOPY; diveticulosis, hemorrhoids  . ESOPHAGOGASTRODUODENOSCOPY N/A 07/30/2017   Procedure: EGD;  Surgeon: Clem Norleen Sero, MD;  Location: [facility] OR;  Service: Gastroenterology;  Laterality: N/A;  . ESOPHAGOGASTRODUODENOSCOPY  07/2017   Procedure: ESOPHAGOGASTRODUODENOSCOPY; esoph stricture dilated  . HYSTERECTOMY      Procedure: HYSTERECTOMY; AT AGE 6  . KNEE SURGERY Left    Procedure: KNEE SURGERY  . TUBAL LIGATION     Procedure: TUBAL LIGATION  [3] Allergies Allergen Reactions  .  Benazepril  Hives, Anaphylaxis and Swelling    angioedema, Throat and lip swelling  . Codeine GI Intolerance  . Morphine Hives  . Ondansetron  Hcl Hives  [4] Family History Problem Relation Name Age of Onset  . Diabetes Mother    . Hypertension Mother    . Hyperlipidemia Mother    . Heart disease Mother    . Cancer Father         colon  . Glaucoma Neg Hx    . Macular degeneration Neg Hx    . Stroke Neg Hx

## 2024-08-02 ENCOUNTER — Ambulatory Visit (HOSPITAL_BASED_OUTPATIENT_CLINIC_OR_DEPARTMENT_OTHER)
Admission: RE | Admit: 2024-08-02 | Discharge: 2024-08-02 | Disposition: A | Discharge status: Home / Self Care | Source: Ambulatory Visit | Attending: Family Medicine | Admitting: Family Medicine

## 2024-08-02 DIAGNOSIS — K7689 Other specified diseases of liver: Secondary | ICD-10-CM | POA: Diagnosis not present

## 2024-08-02 DIAGNOSIS — R1084 Generalized abdominal pain: Secondary | ICD-10-CM | POA: Diagnosis not present

## 2024-08-02 DIAGNOSIS — R197 Diarrhea, unspecified: Secondary | ICD-10-CM | POA: Diagnosis not present

## 2024-08-07 ENCOUNTER — Ambulatory Visit: Admitting: Family Medicine

## 2024-08-09 DIAGNOSIS — G4733 Obstructive sleep apnea (adult) (pediatric): Secondary | ICD-10-CM | POA: Diagnosis not present

## 2024-08-15 DIAGNOSIS — Z95810 Presence of automatic (implantable) cardiac defibrillator: Secondary | ICD-10-CM | POA: Diagnosis not present

## 2024-08-15 DIAGNOSIS — I480 Paroxysmal atrial fibrillation: Secondary | ICD-10-CM | POA: Diagnosis not present

## 2024-08-15 DIAGNOSIS — I5042 Chronic combined systolic (congestive) and diastolic (congestive) heart failure: Secondary | ICD-10-CM | POA: Diagnosis not present

## 2024-08-17 ENCOUNTER — Ambulatory Visit: Admitting: Internal Medicine

## 2024-08-20 DIAGNOSIS — Z95810 Presence of automatic (implantable) cardiac defibrillator: Secondary | ICD-10-CM | POA: Diagnosis not present

## 2024-08-24 ENCOUNTER — Other Ambulatory Visit: Payer: Self-pay | Admitting: Gastroenterology

## 2024-08-24 ENCOUNTER — Telehealth: Payer: Self-pay | Admitting: Gastroenterology

## 2024-08-24 ENCOUNTER — Ambulatory Visit: Admitting: Family Medicine

## 2024-08-24 ENCOUNTER — Encounter: Payer: Self-pay | Admitting: Family Medicine

## 2024-08-24 VITALS — BP 127/60 | HR 80 | Temp 98.7°F | Resp 16 | Ht 66.0 in | Wt 195.0 lb

## 2024-08-24 DIAGNOSIS — I10 Essential (primary) hypertension: Secondary | ICD-10-CM

## 2024-08-24 DIAGNOSIS — I5042 Chronic combined systolic (congestive) and diastolic (congestive) heart failure: Secondary | ICD-10-CM | POA: Diagnosis not present

## 2024-08-24 DIAGNOSIS — K529 Noninfective gastroenteritis and colitis, unspecified: Secondary | ICD-10-CM

## 2024-08-24 DIAGNOSIS — R739 Hyperglycemia, unspecified: Secondary | ICD-10-CM

## 2024-08-24 DIAGNOSIS — E785 Hyperlipidemia, unspecified: Secondary | ICD-10-CM

## 2024-08-24 DIAGNOSIS — R112 Nausea with vomiting, unspecified: Secondary | ICD-10-CM

## 2024-08-24 DIAGNOSIS — Z23 Encounter for immunization: Secondary | ICD-10-CM | POA: Diagnosis not present

## 2024-08-24 MED ORDER — HYOSCYAMINE SULFATE 0.125 MG SL SUBL: 0.1250 mg | SUBLINGUAL_TABLET | Freq: Four times a day (QID) | SUBLINGUAL | 2 refills | Status: AC | PRN

## 2024-08-24 NOTE — Telephone Encounter (Signed)
 Patient requesting medication refill for amitriptyline ? Please advise.

## 2024-08-24 NOTE — Assessment & Plan Note (Signed)
No recent exacerbation, follows with cardiology

## 2024-08-24 NOTE — Assessment & Plan Note (Signed)
 Patient down to 3 episodes or less a day with less pain and nausea since starting Xifaxan 

## 2024-08-24 NOTE — Progress Notes (Signed)
 Subjective:    [name], female    DOB: Feb 28, 1956, 68 y.o.   MRN: [id number]  Chief Complaint  Patient presents with   Hypertension    Here for follow up    HPI Discussed the use of AI scribe software for clinical note transcription with the patient, who gave verbal consent to proceed.  History of Present Illness Tami [name] is a 68 year old female who presents for follow-up on chronic diarrhea and medication management.  She experiences diarrhea three times or less per day, which is an improvement since starting Xifaxan . The episodes are now associated with less pain and nausea, and her appetite has improved.  She continues to take cholestyramine  once daily, dicyclomine  twice daily, and Lomotil  twice daily for diarrhea management. Cramping occurs during episodes of diarrhea a couple of times a week. She previously used hyoscyamine  for cramping relief but has run out of the medication.    Past Medical History:  Diagnosis Date   Abnormal SPEP 07/26/2016   Arthritis    knees, hands   Back pain 07/14/2016   Bowel obstruction (HCC) 11/2014   C. 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   Multiple myeloma (HCC) 07/20/2018   Multiple myeloma not having achieved remission (HCC) 07/20/2018   Myalgia 12/31/2016   Obstructive sleep apnea 03/31/2015   Presence of permanent cardiac pacemaker    SOB (shortness of breath) 04/09/2016   Stroke (HCC)    TIAs   TIA (transient ischemic attack)    UTI (urinary tract  infection)     Past Surgical History:  Procedure Laterality Date   ABDOMINAL HYSTERECTOMY     menorraghia, 2006, total   BIOPSY  09/01/2022   Procedure: BIOPSY;  Surgeon: Charlanne Groom, MD;  Location: [facility];  Service: Gastroenterology;;   CHOLECYSTECTOMY     COLONOSCOPY  2018   [facility] per patient   COLONOSCOPY WITH PROPOFOL  N/A 09/01/2022   Procedure: COLONOSCOPY WITH PROPOFOL ;  Surgeon: Charlanne Groom, MD;  Location: [facility];  Service: Gastroenterology;  Laterality: N/A;   ESOPHAGOGASTRODUODENOSCOPY  2018   [facility]    ESOPHAGOGASTRODUODENOSCOPY N/A 07/03/2024   Procedure: EGD (ESOPHAGOGASTRODUODENOSCOPY);  Surgeon: San Sandor GAILS, DO;  Location: [facility];  Service: Gastroenterology;  Laterality: N/A;   ESOPHAGOGASTRODUODENOSCOPY (EGD) WITH PROPOFOL  N/A 09/01/2022   Procedure: ESOPHAGOGASTRODUODENOSCOPY (EGD) WITH PROPOFOL ;  Surgeon: Charlanne Groom, MD;  Location: [facility];  Service: Gastroenterology;  Laterality: N/A;   IR IMAGING GUIDED PORT INSERTION  06/29/2023   KNEE SURGERY     right, repair torn torn cartialage   PACEMAKER INSERTION  08/2014   changed last in 2023 [facility]   TONSILLECTOMY     TUBAL LIGATION      Family History  Problem Relation Age of Onset   Diabetes Mother    Hypertension Mother    Heart disease Mother        s/p 1 stent   Hyperlipidemia Mother    Arthritis Mother  Colon cancer Father 75   Irritable bowel syndrome Sister    Hypertension Maternal Grandmother    Arthritis Maternal Grandmother    Heart disease Maternal Grandfather        MI   Hypertension Maternal Grandfather    Arthritis Maternal Grandfather    Hyperlipidemia Daughter    Hypertension Daughter    Hypertension Son    Esophageal cancer Neg Hx    Uterine cancer Neg Hx    Pancreatic cancer Neg Hx     Social History   Socioeconomic History   Marital status: Single    Spouse name: [name]   Number of children:  3   Years of education: [name]   Highest education level: High school graduate  Occupational History   [name]  Tobacco Use   Smoking status: Never    Passive exposure: Never   Smokeless tobacco: Never  Vaping Use   Vaping status: Never Used  Substance and Sexual Activity   Alcohol use: No   Drug use: No   Sexual activity: Not Currently    Partners: Male    Birth control/protection: Post-menopausal  Other Topics Concern   [name]  Social History Narrative   Lives at home alone   Retired   Caffeine: coffee   Social Drivers of Corporate investment banker Strain: Low Risk  (07/25/2024)   Overall Financial Resource Strain (CARDIA)    Difficulty of Paying Living Expenses: Not hard at all  Food Insecurity: No Food Insecurity (07/25/2024)   Hunger Vital Sign    Worried About Running Out of Food in the Last Year: Never true    Ran Out of Food in the Last Year: Never true  Transportation Needs: No Transportation Needs (07/25/2024)   PRAPARE - Administrator, Civil Service (Medical): No    Lack of Transportation (Non-Medical): No  Physical Activity: Sufficiently Active (07/25/2024)   Exercise Vital Sign    Days of Exercise per Week: 3 days    Minutes of Exercise per Session: 60 min  Stress: No Stress Concern Present (07/25/2024)   [employer] of Occupational Health - Occupational Stress Questionnaire    Feeling of Stress: Not at all  Social Connections: Moderately Integrated (07/25/2024)   Social Connection and Isolation Panel    Frequency of Communication with Friends and Family: More than three times a week    Frequency of Social Gatherings with Friends and Family: More than three times a week    Attends Religious Services: More than 4 times per year    Active Member of [employer] or Organizations: Yes    Attends Engineer, structural: More than 4 times per year    Marital Status: Divorced  Intimate Partner Violence: Not At Risk (07/25/2024)    Humiliation, Afraid, Rape, and Kick questionnaire    Fear of Current or Ex-Partner: No    Emotionally Abused: No    Physically Abused: No    Sexually Abused: No    Outpatient Medications Prior to Visit  Medication Sig Dispense Refill   acetaminophen  (TYLENOL ) 500 MG tablet Take 500 mg by mouth every 6 (six) hours as needed.     acyclovir  (ZOVIRAX ) 400 MG tablet TAKE ONE (1) TABLET BY MOUTH TWO (2) TIMES DAILY 60 tablet 3   albuterol  (PROVENTIL ) (2.5 MG/3ML) 0.083% nebulizer solution Take 3 mLs (2.5 mg total) by nebulization every 6 (six) hours as needed for wheezing or shortness of breath. 150 mL  2   albuterol  (VENTOLIN  HFA) 108 (90 Base) MCG/ACT inhaler Inhale 2 puffs into the lungs every 6 (six) hours as needed for wheezing or shortness of breath. 18 g 5   amLODipine  (NORVASC ) 5 MG tablet Take 5 mg by mouth daily. (Patient not taking: Reported on 08/25/2024)     aspirin  EC 81 MG tablet Take 81 mg by mouth daily. Swallow whole.     budesonide  (PULMICORT ) 0.5 MG/2ML nebulizer solution Take 2 mLs (0.5 mg total) by nebulization 2 (two) times daily as needed (cough, wheezing or shortness of breath). 120 mL 11   cetirizine  (ZYRTEC ) 10 MG tablet Take 10 mg by mouth at bedtime. (Patient not taking: Reported on 08/25/2024)     Cholecalciferol (VITAMIN D3) 50 MCG (2000 UT) TABS Take 2,000 Units by mouth in the morning. (Patient not taking: Reported on 08/25/2024)     cholestyramine  (QUESTRAN ) 4 g packet Take 1 packet by mouth daily. Separate from other medications by at least 2 hours. Start at daily and can increase to twice daily if no improvement after 7 days. 60 each 11   cholestyramine  light (PREVALITE ) 4 g packet Take 4 g by mouth 2 (two) times daily.     cyclobenzaprine  (FLEXERIL ) 5 MG tablet Take 1 tablet (5 mg total) by mouth 2 (two) times daily as needed for up to 10 doses for muscle spasms. 10 tablet 0   dicyclomine  (BENTYL ) 20 MG tablet Take 1 tablet (20 mg total) by mouth 3 (three) times  daily before meals. 90 tablet 2   diphenhydrAMINE  (BENADRYL ) 25 MG tablet Take 25 mg by mouth.     diphenoxylate -atropine  (LOMOTIL ) 2.5-0.025 MG tablet Take 1 tablet by mouth 2 (two) times daily. 60 tablet 4   estradiol  (ESTRACE ) 0.1 MG/GM vaginal cream Place 0.5 g vaginally 2 (two) times a week. Place 0.5g nightly for two weeks then twice a week after (Patient not taking: Reported on 08/25/2024) 30 g 3   famotidine  (PEPCID ) 20 MG tablet Take 1 tablet (20 mg total) by mouth at bedtime. (Patient not taking: Reported on 08/25/2024) 30 tablet 2   ferrous sulfate (FEROSUL) 325 (65 FE) MG tablet Take 1 tablet (325 mg total) by mouth daily with breakfast. (Patient not taking: Reported on 08/25/2024) 90 tablet 0   fluticasone  (FLONASE ) 50 MCG/ACT nasal spray Place 2 sprays into both nostrils daily. (Patient not taking: Reported on 08/25/2024) 16 g 0   furosemide  (LASIX ) 20 MG tablet Take 20 mg by mouth daily as needed (fluid retention (feet swelling)).     hydrOXYzine  (ATARAX ) 10 MG tablet TAKE ONE (1) TABLET BY MOUTH 3 TIMES DAILY AS NEEDED FOR ITCHING 30 tablet 2   lidocaine  (XYLOCAINE ) 5 % ointment Apply 1 Application topically as needed. 35.44 g 0   lidocaine -prilocaine  (EMLA ) cream Apply 1 Application topically as needed. 30 g 0   loperamide  (IMODIUM ) 2 MG capsule Take 4 mg by mouth in the morning and at bedtime.     losartan  (COZAAR ) 50 MG tablet Take 50 mg by mouth in the morning and at bedtime.     Magnesium  Glycinate 100 MG CAPS Take 200 mg by mouth at bedtime. (Patient not taking: Reported on 08/25/2024)     meclizine  (ANTIVERT ) 12.5 MG tablet Take 12.5 mg by mouth 2 (two) times daily as needed for dizziness or nausea.     metoprolol  succinate (TOPROL -XL) 50 MG 24 hr tablet Take 1 tablet (50 mg total) by mouth 2 (two) times daily. 60 tablet 2  montelukast  (SINGULAIR ) 10 MG tablet Take 1 tablet (10 mg total) by mouth at bedtime. Please start taking this on September 4. (Patient not taking:  Reported on 08/25/2024) 30 tablet 3   Multiple Vitamins-Minerals (CENTRUM SILVER  WOMEN 50+ PO) Take 1 tablet by mouth daily at 6 (six) AM. (Patient not taking: Reported on 08/25/2024)     neomycin -polymyxin-hydrocortisone (CORTISPORIN) OTIC solution Place 4 drops into both ears 4 (four) times daily. (Patient not taking: Reported on 08/25/2024) 10 mL 4   oxyCODONE -acetaminophen  (PERCOCET) 5-325 MG tablet Take 2 tablets by mouth every 6 (six) hours as needed. (Patient not taking: Reported on 08/25/2024) 10 tablet 0   pantoprazole  (PROTONIX ) 40 MG tablet Take 1 tablet (40 mg total) by mouth daily. (Patient not taking: Reported on 08/25/2024) 30 tablet 3   phenazopyridine  (PYRIDIUM ) 100 MG tablet Take 1 tablet (100 mg total) by mouth 3 (three) times daily as needed for pain. 10 tablet 0   phenazopyridine  (PYRIDIUM ) 200 MG tablet Take 1 tablet (200 mg total) by mouth 3 (three) times daily as needed for pain. 30 tablet 0   potassium chloride  SA (KLOR-CON  M) 20 MEQ tablet TAKE 1 TABLET BY MOUTH EVERY MONDAY, WEDNESDAY, AND FRIDAY. 30 tablet 5   prochlorperazine  (COMPAZINE ) 10 MG tablet Take 0.5 tablets (5 mg total) by mouth 2 (two) times daily as needed for nausea or vomiting. 60 tablet 1   Respiratory Therapy Supplies (PILLOW MASK/ADULT) MISC      scopolamine  (TRANSDERM-SCOP) 1 MG/3DAYS Place 1 patch (1.5 mg total) onto the skin every 3 (three) days. (Patient not taking: Reported on 08/25/2024) 10 patch 1   spironolactone  (ALDACTONE ) 25 MG tablet Take 25 mg by mouth daily.     sucralfate  (CARAFATE ) 1 GM/10ML suspension Take 10 mLs (1 g total) by mouth 4 (four) times daily -  with meals and at bedtime. (Patient not taking: Reported on 08/25/2024) 473 mL 1   Vibegron  (GEMTESA ) 75 MG TABS Take 1 tablet (75 mg total) by mouth daily. (Patient not taking: Reported on 08/25/2024) 30 tablet 2   amitriptyline  (ELAVIL ) 25 MG tablet Take 1 tablet (25 mg total) by mouth at bedtime. (Patient not taking: Reported on  08/25/2024) 30 tablet 0   rifaximin  (XIFAXAN ) 200 MG tablet Take 1 tablet (200 mg total) by mouth 3 (three) times daily. 42 tablet 1   Facility-Administered Medications Prior to Visit  Medication Dose Route Frequency Provider Last Rate Last Admin   nitroGLYCERIN  (NITROSTAT ) SL tablet 0.4 mg  0.4 mg Sublingual Once Annitta Fifield A, MD        Allergies  Allergen Reactions   Benazepril  Anaphylaxis, Swelling and Hives    angioedema Throat and lip swelling   Ondansetron  Hcl Hives    Redness and hives post IV admin on 07/05/17   Codeine Nausea And Vomiting   Morphine Hives    Redness and hives noted post IV admin on 07/05/17    Review of Systems  Constitutional:  Negative for fever and malaise/fatigue.  HENT:  Negative for congestion.   Eyes:  Negative for blurred vision.  Respiratory:  Negative for shortness of breath.   Cardiovascular:  Negative for chest pain, palpitations and leg swelling.  Gastrointestinal:  Positive for abdominal pain and diarrhea. Negative for blood in stool and nausea.  Genitourinary:  Negative for dysuria and frequency.  Musculoskeletal:  Negative for falls.  Skin:  Negative for rash.  Neurological:  Negative for dizziness, loss of consciousness and headaches.  Endo/Heme/Allergies:  Negative for  environmental allergies.  Psychiatric/Behavioral:  Negative for depression. The patient is not nervous/anxious.        Objective:    Physical Exam Constitutional:      General: She is not in acute distress.    Appearance: Normal appearance. She is well-developed. She is not toxic-appearing.  HENT:     Head: Normocephalic and atraumatic.     Right Ear: External ear normal.     Left Ear: External ear normal.     Nose: Nose normal.  Eyes:     General:        Right eye: No discharge.        Left eye: No discharge.     Conjunctiva/sclera: Conjunctivae normal.  Neck:     Thyroid : No thyromegaly.  Cardiovascular:     Rate and Rhythm: Normal rate and regular  rhythm.     Heart sounds: Normal heart sounds. No murmur heard. Pulmonary:     Effort: Pulmonary effort is normal. No respiratory distress.     Breath sounds: Normal breath sounds.  Abdominal:     General: Bowel sounds are normal.     Palpations: Abdomen is soft.     Tenderness: There is no abdominal tenderness. There is no guarding.  Musculoskeletal:        General: Normal range of motion.     Cervical back: Neck supple.  Lymphadenopathy:     Cervical: No cervical adenopathy.  Skin:    General: Skin is warm and dry.  Neurological:     Mental Status: She is alert and oriented to person, place, and time.  Psychiatric:        Mood and Affect: Mood normal.        Behavior: Behavior normal.        Thought Content: Thought content normal.        Judgment: Judgment normal.     BP 127/60 (BP Location: Right Arm, Patient Position: Sitting, Cuff Size: Large)   Pulse 80   Temp 98.7 F (37.1 C) (Oral)   Resp 16   Ht 5' 6 (1.676 m)   Wt 195 lb (88.5 kg)   SpO2 99%   BMI 31.47 kg/m  Wt Readings from Last 3 Encounters:  08/25/24 195 lb (88.5 kg)  08/24/24 195 lb (88.5 kg)  07/27/24 192 lb (87.1 kg)    Diabetic Foot Exam - Simple   No data filed    Lab Results  Component Value Date   WBC 4.6 08/25/2024   HGB 11.9 (L) 08/25/2024   HCT 36.2 08/25/2024   PLT 158 08/25/2024   GLUCOSE 112 (H) 08/25/2024   CHOL 195 05/18/2024   TRIG 92.0 05/18/2024   HDL 55.70 05/18/2024   LDLCALC 121 (H) 05/18/2024   ALT 24 08/25/2024   AST 25 08/25/2024   NA 142 08/25/2024   K 4.0 08/25/2024   CL 106 08/25/2024   CREATININE 0.64 08/25/2024   BUN 15 08/25/2024   CO2 24 08/25/2024   TSH 1.45 05/18/2024   INR 0.90 07/28/2018   HGBA1C 5.7 05/18/2024    Lab Results  Component Value Date   TSH 1.45 05/18/2024   Lab Results  Component Value Date   WBC 4.6 08/25/2024   HGB 11.9 (L) 08/25/2024   HCT 36.2 08/25/2024   MCV 90.5 08/25/2024   PLT 158 08/25/2024   Lab Results   Component Value Date   NA 142 08/25/2024   K 4.0 08/25/2024   CHLORIDE 108 12/31/2016  CO2 24 08/25/2024   GLUCOSE 112 (H) 08/25/2024   BUN 15 08/25/2024   CREATININE 0.64 08/25/2024   BILITOT 0.5 08/25/2024   ALKPHOS 62 08/25/2024   AST 25 08/25/2024   ALT 24 08/25/2024   PROT 6.5 08/25/2024   ALBUMIN 4.2 08/25/2024   CALCIUM 9.1 08/25/2024   ANIONGAP 12 08/25/2024   EGFR 96 03/15/2024   GFR 80.92 05/18/2024   Lab Results  Component Value Date   CHOL 195 05/18/2024   Lab Results  Component Value Date   HDL 55.70 05/18/2024   Lab Results  Component Value Date   LDLCALC 121 (H) 05/18/2024   Lab Results  Component Value Date   TRIG 92.0 05/18/2024   Lab Results  Component Value Date   CHOLHDL 3 05/18/2024   Lab Results  Component Value Date   HGBA1C 5.7 05/18/2024       Assessment & Plan:  Needs flu shot -     Flu vaccine HIGH DOSE PF(Fluzone Trivalent)  Chronic diarrhea Assessment & Plan: Patient down to 3 episodes or less a day with less pain and nausea since starting Xifaxan     Chronic combined systolic and diastolic congestive heart failure (HCC) Assessment & Plan: No recent exacerbation, follows with cardiology   Hyperglycemia Assessment & Plan: hgba1c acceptable, minimize simple carbs. Increase exercise as tolerated.    Hyperlipidemia, unspecified hyperlipidemia type Assessment & Plan: Encourage heart healthy diet such as MIND or DASH diet, increase exercise, avoid trans fats, simple carbohydrates and processed foods, consider a krill or fish or flaxseed oil cap daily.    Primary hypertension Assessment & Plan: Well controlled, no changes to meds. Encouraged heart healthy diet such as the DASH diet and exercise as tolerated.     Hypomagnesemia Assessment & Plan: Supplement and monitor    Nausea and vomiting, unspecified vomiting type Assessment & Plan: No recent episodes of vomiting and nausea is improving.    Other orders -      Hyoscyamine  Sulfate; Place 1 tablet (0.125 mg total) under the tongue every 6 (six) hours as needed.  Dispense: 30 tablet; Refill: 2    Assessment and Plan Assessment & Plan Chronic diarrhea with abdominal pain and cramping Experiencing three or fewer episodes of diarrhea per day with reduced pain and nausea since starting Xifaxan . Appetite has improved. Cramping occurs during diarrhea episodes, approximately two to three times a week. - Continue Xifaxan  as prescribed. - Continue cholestyramine  once daily. - Continue dicyclomine  twice daily. - Continue Lomotil  twice daily, with plan to reduce to once daily if bowel movements decrease to two or fewer per day with form. - Prescribe hyoscyamine  for cramping, to be used sublingually as needed. Send prescription to [facility].  Recording duration: 13 minutes     Harlene Horton, MD

## 2024-08-24 NOTE — Patient Instructions (Addendum)
 If bowels slow down more drop Lomotil  to once daily and then only as needed if continue to improve  COVID booster annually in 2 weeks Then  RSV, Respiratory Syncitial Virus Vaccine, Arexvy vaccine once at pharmacy two weeks after that  All [facility] pharmacies including the [facility] offer walk in vaccine clinics M-F 9-4

## 2024-08-25 ENCOUNTER — Inpatient Hospital Stay

## 2024-08-25 ENCOUNTER — Other Ambulatory Visit (HOSPITAL_COMMUNITY): Payer: Self-pay

## 2024-08-25 ENCOUNTER — Telehealth: Payer: Self-pay

## 2024-08-25 ENCOUNTER — Inpatient Hospital Stay (HOSPITAL_BASED_OUTPATIENT_CLINIC_OR_DEPARTMENT_OTHER): Admitting: Hematology & Oncology

## 2024-08-25 ENCOUNTER — Encounter: Payer: Self-pay | Admitting: Hematology & Oncology

## 2024-08-25 ENCOUNTER — Inpatient Hospital Stay: Attending: Hematology & Oncology

## 2024-08-25 VITALS — Ht 66.0 in | Wt 195.0 lb

## 2024-08-25 VITALS — BP 160/85

## 2024-08-25 DIAGNOSIS — C9000 Multiple myeloma not having achieved remission: Secondary | ICD-10-CM

## 2024-08-25 DIAGNOSIS — D500 Iron deficiency anemia secondary to blood loss (chronic): Secondary | ICD-10-CM

## 2024-08-25 DIAGNOSIS — E860 Dehydration: Secondary | ICD-10-CM

## 2024-08-25 DIAGNOSIS — A0472 Enterocolitis due to Clostridium difficile, not specified as recurrent: Secondary | ICD-10-CM

## 2024-08-25 LAB — CMP (CANCER CENTER ONLY)
ALT: 24 U/L (ref 0–44)
AST: 25 U/L (ref 15–41)
Albumin: 4.2 g/dL (ref 3.5–5.0)
Alkaline Phosphatase: 62 U/L (ref 38–126)
Anion gap: 12 (ref 5–15)
BUN: 15 mg/dL (ref 8–23)
CO2: 24 mmol/L (ref 22–32)
Calcium: 9.1 mg/dL (ref 8.9–10.3)
Chloride: 106 mmol/L (ref 98–111)
Creatinine: 0.64 mg/dL (ref 0.44–1.00)
GFR, Estimated: >60 mL/min (ref >60)
Glucose, Bld: 112 mg/dL — ABNORMAL HIGH (ref 70–99)
Potassium: 4 mmol/L (ref 3.5–5.1)
Sodium: 142 mmol/L (ref 135–145)
Total Bilirubin: 0.5 mg/dL (ref 0.0–1.2)
Total Protein: 6.5 g/dL (ref 6.5–8.1)

## 2024-08-25 LAB — CBC WITH DIFFERENTIAL (CANCER CENTER ONLY)
Abs Immature Granulocytes: 0.02 K/uL (ref 0.00–0.07)
Basophils Absolute: 0 K/uL (ref 0.0–0.1)
Basophils Relative: 1 %
Eosinophils Absolute: 0.1 K/uL (ref 0.0–0.5)
Eosinophils Relative: 2 %
HCT: 36.2 % (ref 36.0–46.0)
Hemoglobin: 11.9 g/dL — ABNORMAL LOW (ref 12.0–15.0)
Immature Granulocytes: 0 %
Lymphocytes Relative: 20 %
Lymphs Abs: 0.9 K/uL (ref 0.7–4.0)
MCH: 29.8 pg (ref 26.0–34.0)
MCHC: 32.9 g/dL (ref 30.0–36.0)
MCV: 90.5 fL (ref 80.0–100.0)
Monocytes Absolute: 0.5 K/uL (ref 0.1–1.0)
Monocytes Relative: 10 %
Neutro Abs: 3.1 K/uL (ref 1.7–7.7)
Neutrophils Relative %: 67 %
Platelet Count: 158 K/uL (ref 150–400)
RBC: 4 MIL/uL (ref 3.87–5.11)
RDW: 12.2 % (ref 11.5–15.5)
WBC Count: 4.6 K/uL (ref 4.0–10.5)
nRBC: 0 % (ref 0.0–0.2)

## 2024-08-25 LAB — LACTATE DEHYDROGENASE: LDH: 327 U/L — ABNORMAL HIGH (ref 98–192)

## 2024-08-25 LAB — MAGNESIUM: Magnesium: 1.7 mg/dL (ref 1.7–2.4)

## 2024-08-25 MED ORDER — SODIUM CHLORIDE 0.9 % IV SOLN: Freq: Once | INTRAVENOUS | Status: AC

## 2024-08-25 MED ORDER — RIFAXIMIN 200 MG PO TABS: 200.0000 mg | ORAL_TABLET | Freq: Two times a day (BID) | ORAL | 4 refills | Status: AC

## 2024-08-25 MED ORDER — AMITRIPTYLINE HCL 25 MG PO TABS: 25.0000 mg | ORAL_TABLET | Freq: Every day | ORAL | 3 refills | Status: DC

## 2024-08-25 NOTE — Telephone Encounter (Signed)
Rx for amitriptyline sent to pharmacy.

## 2024-08-25 NOTE — Telephone Encounter (Signed)
 Oral Oncology Patient Advocate Encounter   Received notification that prior authorization for Xifaxan  is required.   PA submitted on 08/25/24 Key [id number] Status is pending      Charlott Hamilton,  CPhT-Adv  she/her/hers [facility]  [facility] Pharmacy Technician Patient Advocate Specialist III [facility] Phone: [phone]  Fax: [phone] [email or web address]

## 2024-08-25 NOTE — Telephone Encounter (Signed)
 Oral Oncology Patient Advocate Encounter  Prior Authorization for Xifaxan  has been approved.    PA# [id number] Effective dates: 08/25/24 through 09/08/24  Patients co-pay is $0.00.   Patient has been notified by MyChart    Tami [name],  CPhT-Adv  she/her/hers [facility]  [facility] Pharmacy Technician Patient Advocate Specialist III [facility] Phone: [phone]  Fax: [phone] [email or web address]

## 2024-08-25 NOTE — Patient Instructions (Signed)
 Dehydration, Adult Dehydration is a condition in which there is not enough water or other fluids in the body. This happens when a person loses more fluids than they take in. Important organs cannot work right without the right amount of fluids. Any loss of fluids from the body can cause dehydration. Dehydration can be mild, worse, or very bad. It should be treated right away to keep it from getting very bad. What are the causes? Conditions that cause loss of water in the body. They include: Watery poop (diarrhea). Vomiting. Sweating a lot. Fever. Infection. Peeing (urinating) a lot. Not drinking enough fluids. Certain medicines, such as medicines that take extra fluid out of the body (diuretics). Lack of safe drinking water. Not being able to get enough water and food. What increases the risk? Having a long-term (chronic) illness that has not been treated the right way, such as: Diabetes. Heart disease. Kidney disease. Being 25 years of age or older. Having a disability. Living in a place that is high above the ground or sea (high in altitude). The thinner, drier air causes more fluid loss. Doing exercises that put stress on your body for a long time. Being active when in hot places. What are the signs or symptoms? Symptoms of dehydration depend on how bad it is. Mild or worse dehydration Thirst. Dry lips or dry mouth. Feeling dizzy or light-headed. Muscle cramps. Passing little pee or dark pee. Pee may be the color of tea. Headache. Very bad dehydration Changes in skin. Skin may: Be cold to the touch (clammy). Be blotchy or pale. Not go back to normal right after you pinch it and let it go. Little or no tears, pee, or sweat. Fast breathing. Low blood pressure. Weak pulse. Pulse that is more than 100 beats a minute when you are sitting still. Other changes, such as: Feeling very thirsty. Eyes that look hollow (sunken). Cold hands and feet. Being confused. Being very  tired (lethargic) or having trouble waking from sleep. Losing weight. Loss of consciousness. How is this treated? Treatment for this condition depends on how bad your dehydration is. Treatment should start right away. Do not wait until your condition gets very bad. Very bad dehydration is an emergency. You will need to go to a hospital. Mild or worse dehydration can be treated at home. You may be asked to: Drink more fluids. Drink an oral rehydration solution (ORS). This drink gives you the right amount of fluids, salts, and minerals (electrolytes). Very bad dehydration can be treated: With fluids through an IV tube. By correcting low levels of electrolytes in the body. By treating the problem that caused your dehydration. Follow these instructions at home: Oral rehydration solution If told by your doctor, drink an ORS: Make an ORS. Use instructions on the package. Start by drinking small amounts, about  cup (120 mL) every 5-10 minutes. Slowly drink more until you have had the amount that your doctor said to have.  Eating and drinking  Drink enough clear fluid to keep your pee pale yellow. If you were told to drink an ORS, finish the ORS first. Then, start slowly drinking other clear fluids. Drink fluids such as: Water. Do not drink only water. Doing that can make the salt (sodium) level in your body get too low. Water from ice chips you suck on. Fruit juice that you have added water to (diluted). Low-calorie sports drinks. Eat foods that have the right amounts of salts and minerals, such as bananas, oranges, potatoes,  tomatoes, or spinach. Do not drink alcohol. Avoid drinks that have caffeine or sugar. These include:: High-calorie sports drinks. Fruit juice that you did not add water to. Soda. Coffee or energy drinks. Avoid foods that are greasy or have a lot of fat or sugar. General instructions Take over-the-counter and prescription medicines only as told by your doctor. Do  not take sodium tablets. Doing that can make the salt level in your body get too high. Return to your normal activities as told by your doctor. Ask your doctor what activities are safe for you. Keep all follow-up visits. Your doctor may check and change your treatment. Contact a doctor if: You have pain in your belly (abdomen) and the pain: Gets worse. Stays in one place. You have a rash. You have a stiff neck. You get angry or annoyed more easily than normal. You are more tired or have a harder time waking than normal. You feel weak or dizzy. You feel very thirsty. Get help right away if: You have any symptoms of very bad dehydration. You vomit every time you eat or drink. Your vomiting gets worse, does not go away, or you vomit blood or green stuff. You are getting treatment, but symptoms are getting worse. You have a fever. You have a very bad headache. You have: Diarrhea that gets worse or does not go away. Blood in your poop (stool). This may cause poop to look black and tarry. No pee in 6-8 hours. Only a small amount of pee in 6-8 hours, and the pee is very dark. You have trouble breathing. These symptoms may be an emergency. Get help right away. Call 911. Do not wait to see if the symptoms will go away. Do not drive yourself to the hospital. This information is not intended to replace advice given to you by your health care provider. Make sure you discuss any questions you have with your health care provider. Document Revised: 05/25/2022 Document Reviewed: 05/25/2022 Elsevier Patient Education  2024 [employer].

## 2024-08-25 NOTE — Progress Notes (Signed)
 Hematology and Oncology Follow Up Visit  Tami [name] [id number] 1956/10/05 68 y.o. 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  This is probably the best out of see her look.  She is not having much in the way of diarrhea.  The Xifaxin really helped her out.  In addition, her Gastroenterologist took her off some medications.  She still has a little bit of diarrhea but is very much better and very well manage.  Otherwise, she feels well.  She really has no complaints.  It has been almost 11 months since she had any therapy for the myeloma.  When we last saw her, her monoclonal spike was 0.3 g/dL.  Her IgG level was 720 mg/dL.  The Kappa light chain level was 0.7 mg/dL.  Her appetite is doing okay.  She has had no cough or shortness of breath.  She has had no rashes.  There is been no bleeding.  She has had no fever.  Overall, I would say that her performance status is probably ECOG 1.     Medications:  Allergies as of 08/25/2024       Reactions   Benazepril  Anaphylaxis, Swelling, Hives   angioedema Throat and lip swelling   Ondansetron  Hcl Hives   Redness and hives post IV admin on 07/05/17   Codeine Nausea And Vomiting   Morphine Hives   Redness and hives noted post IV admin on 07/05/17        Medication List        Accurate as of August 25, 2024  8:33 AM. If you have any questions, ask your nurse or doctor.          acetaminophen  500 MG tablet Commonly known  as: TYLENOL  Take 500 mg by mouth every 6 (six) hours as needed.   acyclovir  400 MG tablet Commonly known as: ZOVIRAX  TAKE ONE (1) TABLET BY MOUTH TWO (2) TIMES DAILY   albuterol  (2.5 MG/3ML) 0.083% nebulizer solution Commonly known as: PROVENTIL  Take 3 mLs (2.5 mg total) by nebulization every 6 (six) hours as needed for wheezing or shortness of breath.   albuterol  108 (90 Base) MCG/ACT inhaler Commonly known as: VENTOLIN  HFA Inhale 2 puffs into the lungs every 6 (six) hours as needed for wheezing or shortness of breath.   amitriptyline  25 MG tablet Commonly known as: ELAVIL  Take 1 tablet (25 mg total) by mouth at bedtime.   amLODipine  5 MG tablet Commonly known as: NORVASC  Take 5 mg by mouth daily.   aspirin  EC 81 MG tablet Take 81 mg by mouth daily. Swallow whole.   budesonide  0.5 MG/2ML nebulizer solution Commonly known as: Pulmicort  Take 2 mLs (0.5 mg total) by nebulization 2 (two) times daily as needed (cough, wheezing or shortness of breath).   CENTRUM SILVER  WOMEN 50+ PO Take 1 tablet by mouth daily at 6 (six) AM.   cetirizine  10 MG tablet Commonly known as: ZYRTEC  Take 10  mg by mouth at bedtime.   cholestyramine  4 g packet Commonly known as: Questran  Take 1 packet by mouth daily. Separate from other medications by at least 2 hours. Start at daily and can increase to twice daily if no improvement after 7 days.   cholestyramine  light 4 g packet Commonly known as: PREVALITE  Take 4 g by mouth 2 (two) times daily.   cyclobenzaprine  5 MG tablet Commonly known as: FLEXERIL  Take 1 tablet (5 mg total) by mouth 2 (two) times daily as needed for up to 10 doses for muscle spasms.   dicyclomine  20 MG tablet Commonly known as: BENTYL  Take 1 tablet (20 mg total) by mouth 3 (three) times daily before meals.   diphenhydrAMINE  25 MG tablet Commonly known as: BENADRYL  Take 25 mg by mouth.   diphenoxylate -atropine  2.5-0.025 MG tablet Commonly known as: Lomotil  Take 1  tablet by mouth 2 (two) times daily.   estradiol  0.1 MG/GM vaginal cream Commonly known as: ESTRACE  Place 0.5 g vaginally 2 (two) times a week. Place 0.5g nightly for two weeks then twice a week after   famotidine  20 MG tablet Commonly known as: PEPCID  Take 1 tablet (20 mg total) by mouth at bedtime.   ferrous sulfate 325 (65 FE) MG tablet Commonly known as: FeroSul Take 1 tablet (325 mg total) by mouth daily with breakfast.   fluticasone  50 MCG/ACT nasal spray Commonly known as: FLONASE  Place 2 sprays into both nostrils daily.   furosemide  20 MG tablet Commonly known as: LASIX  Take 20 mg by mouth daily as needed (fluid retention (feet swelling)).   Gemtesa  75 MG Tabs Generic drug: Vibegron  Take 1 tablet (75 mg total) by mouth daily.   hydrOXYzine  10 MG tablet Commonly known as: ATARAX  TAKE ONE (1) TABLET BY MOUTH 3 TIMES DAILY AS NEEDED FOR ITCHING   hyoscyamine  0.125 MG SL tablet Commonly known as: LEVSIN  SL Place 1 tablet (0.125 mg total) under the tongue every 6 (six) hours as needed.   lidocaine  5 % ointment Commonly known as: XYLOCAINE  Apply 1 Application topically as needed.   lidocaine -prilocaine  cream Commonly known as: EMLA  Apply 1 Application topically as needed.   loperamide  2 MG capsule Commonly known as: IMODIUM  Take 4 mg by mouth in the morning and at bedtime.   losartan  50 MG tablet Commonly known as: COZAAR  Take 50 mg by mouth in the morning and at bedtime.   Magnesium  Glycinate 100 MG Caps Take 200 mg by mouth at bedtime.   meclizine  12.5 MG tablet Commonly known as: ANTIVERT  Take 12.5 mg by mouth 2 (two) times daily as needed for dizziness or nausea.   metoprolol  succinate 50 MG 24 hr tablet Commonly known as: TOPROL -XL Take 1 tablet (50 mg total) by mouth 2 (two) times daily.   montelukast  10 MG tablet Commonly known as: Singulair  Take 1 tablet (10 mg total) by mouth at bedtime. Please start taking this on September 4.    neomycin -polymyxin-hydrocortisone OTIC solution Commonly known as: CORTISPORIN Place 4 drops into both ears 4 (four) times daily.   oxyCODONE -acetaminophen  5-325 MG tablet Commonly known as: Percocet Take 2 tablets by mouth every 6 (six) hours as needed.   pantoprazole  40 MG tablet Commonly known as: PROTONIX  Take 1 tablet (40 mg total) by mouth daily.   phenazopyridine  200 MG tablet Commonly known as: Pyridium  Take 1 tablet (200 mg total) by mouth 3 (three) times daily as needed for pain.   phenazopyridine  100 MG tablet Commonly known as: Pyridium  Take 1  tablet (100 mg total) by mouth 3 (three) times daily as needed for pain.   Pillow Mask/Adult Misc   potassium chloride  SA 20 MEQ tablet Commonly known as: KLOR-CON  M TAKE 1 TABLET BY MOUTH EVERY MONDAY, WEDNESDAY, AND FRIDAY.   prochlorperazine  10 MG tablet Commonly known as: COMPAZINE  Take 0.5 tablets (5 mg total) by mouth 2 (two) times daily as needed for nausea or vomiting.   rifaximin  200 MG tablet Commonly known as: Xifaxan  Take 1 tablet (200 mg total) by mouth 3 (three) times daily.   scopolamine  1 MG/3DAYS Commonly known as: TRANSDERM-SCOP Place 1 patch (1.5 mg total) onto the skin every 3 (three) days.   spironolactone  25 MG tablet Commonly known as: ALDACTONE  Take 25 mg by mouth daily.   sucralfate  1 GM/10ML suspension Commonly known as: Carafate  Take 10 mLs (1 g total) by mouth 4 (four) times daily -  with meals and at bedtime.   terbinafine 250 MG tablet Commonly known as: LAMISIL Take 250 mg by mouth daily.   triamcinolone  cream 0.5 % Commonly known as: KENALOG  Apply topically.   Vitamin D3 50 MCG (2000 UT) Tabs Take 2,000 Units by mouth in the morning.        Allergies:  Allergies  Allergen Reactions   Benazepril  Anaphylaxis, Swelling and Hives    angioedema Throat and lip swelling   Ondansetron  Hcl Hives    Redness and hives post IV admin on 07/05/17   Codeine Nausea And Vomiting    Morphine Hives    Redness and hives noted post IV admin on 07/05/17    Past Medical History, Surgical history, Social history, and Family History were reviewed and updated.  Review of Systems: Review of Systems  Constitutional: Negative.   HENT: Negative.    Eyes: Negative.   Respiratory: Negative.    Cardiovascular: Negative.   Gastrointestinal:  Positive for abdominal pain, diarrhea and nausea.  Musculoskeletal:  Positive for joint pain and myalgias.  Skin: Negative.   Neurological: Negative.   Endo/Heme/Allergies: Negative.   Psychiatric/Behavioral: Negative.       Physical Exam:  height is 5' 6 (1.676 m) and weight is 195 lb (88.5 kg).  Her temperature is 97.9.  Pulse 76.  Blood pressure 136/90.  Wt Readings from Last 3 Encounters:  08/25/24 195 lb (88.5 kg)  08/24/24 195 lb (88.5 kg)  07/27/24 192 lb (87.1 kg)    Physical Exam Vitals reviewed.  HENT:     Head: Normocephalic and atraumatic.  Eyes:     Pupils: Pupils are equal, round, and reactive to light.  Cardiovascular:     Rate and Rhythm: Normal rate and regular rhythm.     Heart sounds: Normal heart sounds.  Pulmonary:     Effort: Pulmonary effort is normal.     Breath sounds: Normal breath sounds.  Abdominal:     General: Bowel sounds are normal.     Palpations: Abdomen is soft.  Musculoskeletal:        General: No tenderness or deformity. Normal range of motion.     Cervical back: Normal range of motion.  Lymphadenopathy:     Cervical: No cervical adenopathy.  Skin:    General: Skin is warm and dry.     Findings: No erythema or rash.  Neurological:     Mental Status: She is alert and oriented to person, place, and time.  Psychiatric:        Behavior: Behavior normal.        Thought  Content: Thought content normal.        Judgment: Judgment normal.      Lab Results  Component Value Date   WBC 4.6 08/25/2024   HGB 11.9 (L) 08/25/2024   HCT 36.2 08/25/2024   MCV 90.5 08/25/2024   PLT  158 08/25/2024   Lab Results  Component Value Date   FERRITIN 166 11/26/2023   IRON  76 11/26/2023   TIBC 385 11/26/2023   UIBC 309 11/26/2023   IRONPCTSAT 20 11/26/2023   Lab Results  Component Value Date   RETICCTPCT 1.2 11/27/2022   RBC 4.00 08/25/2024   RETICCTABS 32,640 07/16/2016   Lab Results  Component Value Date   KPAFRELGTCHN 6.8 07/21/2024   LAMBDASER 2.7 (L) 07/21/2024   KAPLAMBRATIO 2.52 (H) 07/21/2024   Lab Results  Component Value Date   IGGSERUM 712 07/21/2024   IGGSERUM 736 07/21/2024   IGA 41 (L) 07/21/2024   IGA 46 (L) 07/21/2024   IGMSERUM 25 (L) 07/21/2024   IGMSERUM 27 07/21/2024   Lab Results  Component Value Date   TOTALPROTELP 6.2 07/21/2024   ALBUMINELP 3.4 07/21/2024   A1GS 0.2 07/21/2024   A2GS 1.0 07/21/2024   BETS 1.0 07/21/2024   BETA2SER 0.5 07/16/2016   GAMS 0.6 07/21/2024   MSPIKE 0.3 (H) 07/21/2024   SPEI Comment 03/22/2024     Chemistry      Component Value Date/Time   NA 141 07/27/2024 1557   NA 143 11/08/2017 1127   NA 140 12/31/2016 1000   K 3.9 07/27/2024 1557   K 4.0 11/08/2017 1127   K 3.6 12/31/2016 1000   CL 106 07/27/2024 1557   CL 107 11/08/2017 1127   CO2 22 07/27/2024 1557   CO2 25 11/08/2017 1127   CO2 24 12/31/2016 1000   BUN 17 07/27/2024 1557   BUN 19 11/08/2017 1127   BUN 22.3 12/31/2016 1000   CREATININE 0.71 07/27/2024 1557   CREATININE 0.60 07/21/2024 0838   CREATININE 0.63 03/15/2024 0947   CREATININE 0.8 12/31/2016 1000      Component Value Date/Time   CALCIUM 9.5 07/27/2024 1557   CALCIUM 9.9 11/08/2017 1127   CALCIUM 9.5 12/31/2016 1000   ALKPHOS 61 07/27/2024 1557   ALKPHOS 44 11/08/2017 1127   ALKPHOS 51 12/31/2016 1000   AST 29 07/27/2024 1557   AST 28 07/21/2024 0838   AST 28 12/31/2016 1000   ALT 38 07/27/2024 1557   ALT 24 07/21/2024 0838   ALT 36 11/08/2017 1127   ALT 16 12/31/2016 1000   BILITOT 0.4 07/27/2024 1557   BILITOT 0.5 07/21/2024 0838   BILITOT 0.46 12/31/2016  1000       Impression and Plan: Tami [name] is a very pleasant 68 yo African American female with a previous IgG kappa MGUS with progression to myeloma. Again, she has responded very nicely to treatment.  We will continue to hold off on treating her myeloma for right now.  Again her quality of life is better given that the diarrhea is better.  We will continue her on the Xifaxan .  She will get some IV fluid in the office today.  I know that this does make her feel better.  I will like to get her back before Thanksgiving.  I would like to then be able to get her through the Holiday season.   Maude JONELLE Crease, MD 10/17/20258:33 AM

## 2024-08-25 NOTE — Patient Instructions (Signed)

## 2024-08-26 LAB — IGG, IGA, IGM
IgA: 56 mg/dL — ABNORMAL LOW (ref 87–352)
IgG (Immunoglobin G), Serum: 728 mg/dL (ref 586–1602)
IgM (Immunoglobulin M), Srm: 28 mg/dL (ref 26–217)

## 2024-08-27 ENCOUNTER — Encounter: Payer: Self-pay | Admitting: Family Medicine

## 2024-08-27 NOTE — Assessment & Plan Note (Signed)
 Well controlled, no changes to meds. Encouraged heart healthy diet such as the DASH diet and exercise as tolerated.

## 2024-08-27 NOTE — Assessment & Plan Note (Signed)
 No recent episodes of vomiting and nausea is improving.

## 2024-08-27 NOTE — Assessment & Plan Note (Signed)
 Supplement and monitor

## 2024-08-27 NOTE — Assessment & Plan Note (Signed)
 [id number] acceptable, minimize simple carbs. Increase exercise as tolerated.

## 2024-08-27 NOTE — Assessment & Plan Note (Signed)
 Encourage heart healthy diet such as MIND or DASH diet, increase exercise, avoid trans fats, simple carbohydrates and processed foods, consider a krill or fish or flaxseed oil cap daily.

## 2024-08-28 LAB — KAPPA/LAMBDA LIGHT CHAINS
Kappa free light chain: 6.3 mg/L (ref 3.3–19.4)
Kappa, lambda light chain ratio: 2.42 — ABNORMAL HIGH (ref 0.26–1.65)
Lambda free light chains: 2.6 mg/L — ABNORMAL LOW (ref 5.7–26.3)

## 2024-08-29 DIAGNOSIS — I071 Rheumatic tricuspid insufficiency: Secondary | ICD-10-CM | POA: Diagnosis not present

## 2024-08-29 DIAGNOSIS — I517 Cardiomegaly: Secondary | ICD-10-CM | POA: Diagnosis not present

## 2024-08-30 LAB — PROTEIN ELECTROPHORESIS, SERUM, WITH REFLEX
A/G Ratio: 1.3 (ref 0.7–1.7)
Albumin ELP: 3.5 g/dL (ref 2.9–4.4)
Alpha-1-Globulin: 0.2 g/dL (ref 0.0–0.4)
Alpha-2-Globulin: 0.9 g/dL (ref 0.4–1.0)
Beta Globulin: 1 g/dL (ref 0.7–1.3)
Gamma Globulin: 0.5 g/dL (ref 0.4–1.8)
Globulin, Total: 2.6 g/dL (ref 2.2–3.9)
M-Spike, %: 0.2 g/dL — ABNORMAL HIGH
SPEP Interpretation: 0
Total Protein ELP: 6.1 g/dL (ref 6.0–8.5)

## 2024-08-30 LAB — IMMUNOFIXATION REFLEX, SERUM
IgA: 62 mg/dL — ABNORMAL LOW (ref 87–352)
IgG (Immunoglobin G), Serum: 785 mg/dL (ref 586–1602)
IgM (Immunoglobulin M), Srm: 32 mg/dL (ref 26–217)

## 2024-09-06 MED ORDER — FUROSEMIDE 20 MG PO TABS: 20.0000 mg | ORAL_TABLET | Freq: Every day | ORAL | 1 refills | Status: AC | PRN

## 2024-09-06 NOTE — Telephone Encounter (Signed)
 Rx sent.

## 2024-09-06 NOTE — Addendum Note (Signed)
 Addended by: Josephyne Tarter D on: 09/06/2024 10:20 AM   Modules accepted: Orders

## 2024-09-06 NOTE — Telephone Encounter (Unsigned)
 Copied from CRM [id number]. Topic: Clinical - Medication Refill >> Sep 06, 2024 10:17 AM Harlene ORN wrote: Medication: potassium chloride  SA (KLOR-CON  M) 20 MEQ tablet  Has the patient contacted their pharmacy? Yes (Agent: If no, request that the patient contact the pharmacy for the refill. If patient does not wish to contact the pharmacy document the reason why and proceed with request.) (Agent: If yes, when and what did the pharmacy advise?)  This is the patient's preferred pharmacy:  [facility] - [place], [place] - [address] [place] [address] Phone: [phone] Fax: [phone]  Is this the correct pharmacy for this prescription? Yes If no, delete pharmacy and type the correct one.   Has the prescription been filled recently? No  Is the patient out of the medication? Yes  Has the patient been seen for an appointment in the last year OR does the patient have an upcoming appointment? Yes  Can we respond through MyChart? No  Agent: Please be advised that Rx refills may take up to 3 business days. We ask that you follow-up with your pharmacy.

## 2024-09-08 ENCOUNTER — Telehealth: Payer: Self-pay

## 2024-09-08 NOTE — Telephone Encounter (Signed)
 Spoke with pt -- stated she needs a refill on her potassium script and she hasn't taken he medication in 5 days  and at the time only was taking the medication on M,W,F but was told to take daily -- last refill 10/2023.  Pt denies diarrhea spells    Copied from CRM #[id number]. Topic: Clinical - Medical Advice >> Sep 07, 2024  4:11 PM Anairis L wrote: Reason for CRM: Patient is requesting a call back regarding medication from [facility]

## 2024-09-09 DIAGNOSIS — G4733 Obstructive sleep apnea (adult) (pediatric): Secondary | ICD-10-CM | POA: Diagnosis not present

## 2024-09-10 ENCOUNTER — Other Ambulatory Visit: Payer: Self-pay | Admitting: Family Medicine

## 2024-09-10 MED ORDER — POTASSIUM CHLORIDE CRYS ER 20 MEQ PO TBCR: 20.0000 meq | EXTENDED_RELEASE_TABLET | Freq: Every day | ORAL | 5 refills | Status: DC

## 2024-09-11 NOTE — Telephone Encounter (Signed)
 Pt.notified

## 2024-09-14 ENCOUNTER — Ambulatory Visit: Admitting: Internal Medicine

## 2024-09-22 ENCOUNTER — Encounter: Payer: Self-pay | Admitting: Internal Medicine

## 2024-09-22 ENCOUNTER — Other Ambulatory Visit: Payer: Self-pay

## 2024-09-22 ENCOUNTER — Ambulatory Visit (INDEPENDENT_AMBULATORY_CARE_PROVIDER_SITE_OTHER): Admitting: Internal Medicine

## 2024-09-22 VITALS — BP 134/87 | HR 83 | Temp 97.6°F | Wt 195.0 lb

## 2024-09-22 DIAGNOSIS — Z8619 Personal history of other infectious and parasitic diseases: Secondary | ICD-10-CM | POA: Diagnosis not present

## 2024-09-22 DIAGNOSIS — K580 Irritable bowel syndrome with diarrhea: Secondary | ICD-10-CM | POA: Diagnosis not present

## 2024-09-22 MED ORDER — DIPHENOXYLATE-ATROPINE 2.5-0.025 MG PO TABS: 1.0000 | ORAL_TABLET | Freq: Two times a day (BID) | ORAL | 4 refills | Status: AC

## 2024-09-22 NOTE — Progress Notes (Signed)
 [name], female   DOB: 1955-11-10, 68 y.o.   MRN: [id number]  HPI Tami [name], is a 68 yoF with MM in remission, followed for IBS-D, previously had history of cdifficile. mostly 3 BM per day, after she has a meal. Dr timmy tried her on new meds which she reports having improvement; 2 times a day of rifaxamin, along with 3 times a day of bentyl  2 times a day of lomotil  Cholesterymine once a day Amiltryptiline 25mg  at bedtime -- also added by dr charlanne    Outpatient Encounter Medications as of 09/22/2024  Medication Sig   acetaminophen  (TYLENOL ) 500 MG tablet Take 500 mg by mouth every 6 (six) hours as needed.   acyclovir  (ZOVIRAX ) 400 MG tablet TAKE ONE (1) TABLET BY MOUTH TWO (2) TIMES DAILY   albuterol  (PROVENTIL ) (2.5 MG/3ML) 0.083% nebulizer solution Take 3 mLs (2.5 mg total) by nebulization every 6 (six) hours as needed for wheezing or shortness of breath.   albuterol  (VENTOLIN  HFA) 108 (90 Base) MCG/ACT inhaler Inhale 2 puffs into the lungs every 6 (six) hours as needed for wheezing or shortness of breath.   amitriptyline  (ELAVIL ) 25 MG tablet Take 1 tablet (25 mg total) by mouth at bedtime.   aspirin  EC 81 MG tablet Take 81 mg by mouth daily. Swallow whole.   budesonide  (PULMICORT ) 0.5 MG/2ML nebulizer solution Take 2 mLs (0.5 mg total) by nebulization 2 (two) times daily as needed (cough, wheezing or shortness of breath).   cholestyramine  (QUESTRAN ) 4 g packet Take 1 packet by mouth daily. Separate from other medications by at least 2 hours. Start at daily and can increase to twice daily if no improvement after 7 days.   cholestyramine  light (PREVALITE ) 4 g packet Take 4 g by mouth 2 (two) times daily.   cyclobenzaprine  (FLEXERIL ) 5 MG tablet Take 1 tablet (5 mg total) by mouth 2 (two) times daily as needed for up to 10 doses for muscle spasms.   dicyclomine  (BENTYL ) 20 MG tablet Take 1 tablet (20 mg total) by mouth 3 (three) times daily before meals.    diphenhydrAMINE  (BENADRYL ) 25 MG tablet Take 25 mg by mouth.   diphenoxylate -atropine  (LOMOTIL ) 2.5-0.025 MG tablet Take 1 tablet by mouth 2 (two) times daily.   furosemide  (LASIX ) 20 MG tablet Take 1 tablet (20 mg total) by mouth daily as needed (fluid retention (feet swelling)).   hydrOXYzine  (ATARAX ) 10 MG tablet TAKE ONE (1) TABLET BY MOUTH 3 TIMES DAILY AS NEEDED FOR ITCHING   hyoscyamine  (LEVSIN  SL) 0.125 MG SL tablet Place 1 tablet (0.125 mg total) under the tongue every 6 (six) hours as needed.   lidocaine  (XYLOCAINE ) 5 % ointment Apply 1 Application topically as needed.   lidocaine -prilocaine  (EMLA ) cream Apply 1 Application topically as needed.   loperamide  (IMODIUM ) 2 MG capsule Take 4 mg by mouth in the morning and at bedtime.   losartan  (COZAAR ) 50 MG tablet Take 50 mg by mouth in the morning and at bedtime.   meclizine  (ANTIVERT ) 12.5 MG tablet Take 12.5 mg by mouth 2 (two) times daily as needed for dizziness or nausea.   metoprolol  succinate (TOPROL -XL) 50 MG 24 hr tablet Take 1 tablet (50 mg total) by mouth 2 (two) times daily.   oxyCODONE -acetaminophen  (PERCOCET) 5-325 MG tablet Take 2 tablets by mouth every 6 (six) hours as needed.   phenazopyridine  (PYRIDIUM ) 100 MG tablet Take 1 tablet (100 mg total) by mouth 3 (three) times daily as needed for  pain.   phenazopyridine  (PYRIDIUM ) 200 MG tablet Take 1 tablet (200 mg total) by mouth 3 (three) times daily as needed for pain.   potassium chloride  SA (KLOR-CON  M) 20 MEQ tablet Take 1 tablet (20 mEq total) by mouth daily.   prochlorperazine  (COMPAZINE ) 10 MG tablet Take 0.5 tablets (5 mg total) by mouth 2 (two) times daily as needed for nausea or vomiting.   Respiratory Therapy Supplies (PILLOW MASK/ADULT) MISC    rifaximin  (XIFAXAN ) 200 MG tablet Take 1 tablet (200 mg total) by mouth 2 (two) times daily.   spironolactone  (ALDACTONE ) 25 MG tablet Take 25 mg by mouth daily.   terbinafine (LAMISIL) 250 MG tablet Take 250 mg by mouth  daily.   triamcinolone  cream (KENALOG ) 0.5 % Apply topically.   amLODipine  (NORVASC ) 5 MG tablet Take 5 mg by mouth daily. (Patient not taking: Reported on 09/22/2024)   cetirizine  (ZYRTEC ) 10 MG tablet Take 10 mg by mouth at bedtime. (Patient not taking: Reported on 09/22/2024)   Cholecalciferol (VITAMIN D3) 50 MCG (2000 UT) TABS Take 2,000 Units by mouth in the morning. (Patient not taking: Reported on 09/22/2024)   estradiol  (ESTRACE ) 0.1 MG/GM vaginal cream Place 0.5 g vaginally 2 (two) times a week. Place 0.5g nightly for two weeks then twice a week after (Patient not taking: Reported on 09/22/2024)   famotidine  (PEPCID ) 20 MG tablet Take 1 tablet (20 mg total) by mouth at bedtime. (Patient not taking: Reported on 09/22/2024)   ferrous sulfate (FEROSUL) 325 (65 FE) MG tablet Take 1 tablet (325 mg total) by mouth daily with breakfast. (Patient not taking: Reported on 09/22/2024)   fluticasone  (FLONASE ) 50 MCG/ACT nasal spray Place 2 sprays into both nostrils daily. (Patient not taking: Reported on 09/22/2024)   Magnesium  Glycinate 100 MG CAPS Take 200 mg by mouth at bedtime. (Patient not taking: Reported on 09/22/2024)   montelukast  (SINGULAIR ) 10 MG tablet Take 1 tablet (10 mg total) by mouth at bedtime. Please start taking this on September 4. (Patient not taking: Reported on 09/22/2024)   Multiple Vitamins-Minerals (CENTRUM SILVER  WOMEN 50+ PO) Take 1 tablet by mouth daily at 6 (six) AM. (Patient not taking: Reported on 09/22/2024)   neomycin -polymyxin-hydrocortisone (CORTISPORIN) OTIC solution Place 4 drops into both ears 4 (four) times daily. (Patient not taking: Reported on 09/22/2024)   pantoprazole  (PROTONIX ) 40 MG tablet Take 1 tablet (40 mg total) by mouth daily. (Patient not taking: Reported on 09/22/2024)   scopolamine  (TRANSDERM-SCOP) 1 MG/3DAYS Place 1 patch (1.5 mg total) onto the skin every 3 (three) days. (Patient not taking: Reported on 09/22/2024)   sucralfate  (CARAFATE ) 1  GM/10ML suspension Take 10 mLs (1 g total) by mouth 4 (four) times daily -  with meals and at bedtime. (Patient not taking: Reported on 09/22/2024)   Vibegron  (GEMTESA ) 75 MG TABS Take 1 tablet (75 mg total) by mouth daily. (Patient not taking: Reported on 09/22/2024)   Facility-Administered Encounter Medications as of 09/22/2024  Medication   nitroGLYCERIN  (NITROSTAT ) SL tablet 0.4 mg     Patient Active Problem List   Diagnosis Date Noted   Regurgitation of food 07/03/2024   Nausea and vomiting 07/03/2024   Abdominal pain, epigastric 07/03/2024   Stress incontinence 01/31/2024   Nocturia 01/31/2024   Pelvic pain 01/31/2024   Incontinence of feces 01/31/2024   Vaginal atrophy 01/31/2024   Vaginal discharge 01/31/2024   Acute pain of left shoulder 09/03/2023   Chronic combined systolic and diastolic congestive heart failure (HCC) 03/28/2023  Intractable nausea and vomiting 03/27/2023   Asthma 12/20/2022   Diarrhea    Hypocalcemia 08/25/2022   Iron  deficiency anemia due to chronic blood loss 05/28/2022   Chronic diarrhea 05/27/2022   Soft tissue lesion 04/28/2022   Obesity (BMI 30-39.9) 04/28/2022   SBO (small bowel obstruction) (HCC) 04/27/2022   Dehydration 04/03/2022   Acute vaginitis 03/18/2022   Cervical cancer screening 03/18/2022   Dizziness 12/11/2021   Visual changes 11/26/2021   Ear fullness, bilateral 08/12/2021   Bilateral knee pain 08/08/2021   History of 2019 novel coronavirus disease (COVID-19) 12/06/2020   Vitamin deficiency 07/31/2020   Acute combined systolic and diastolic heart failure (HCC) 05/09/2020   Hypomagnesemia 03/04/2020   Thrombocytopenia 03/04/2020   Dysphagia 03/04/2020   Constipation 03/04/2020   Sensation of pressure in bladder area 01/17/2020   Hyperglycemia 01/17/2020   Peripheral neuropathy 11/15/2019   Chronic migraine without aura, with intractable migraine, so stated, with status migrainosus 10/17/2019   ETD (Eustachian tube  dysfunction), bilateral 09/12/2019   Urinary frequency 07/17/2019   Headache 07/17/2019   Palpitation 05/15/2019   Clostridium difficile diarrhea 09/04/2018   Thyroid  nodule 08/26/2018   Multiple myeloma (HCC) 07/20/2018   Multiple myeloma not having achieved remission (HCC) 07/20/2018   Hematuria 07/11/2018   Seasonal allergies 06/23/2018   Right shoulder pain 06/07/2018   Neck pain 06/07/2018   Paresthesias 06/07/2018   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   Pain in thoracic spine 03/18/2018   Thoracic back pain 02/15/2018   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   Hypokalemia 08/20/2017   Anemia 08/20/2017   Elevated sed rate 08/15/2017   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/2017   Epigastric abdominal pain 03/22/2017   Myalgia 12/31/2016   Abnormal SPEP 07/26/2016   Low back pain 07/14/2016   Insomnia 12/29/2015   Tension headache 10/21/2015   Muscle spasms of neck 10/18/2015   Dysuria 10/13/2015   AP (abdominal pain) 07/07/2015   Cardiomyopathy (HCC) 04/19/2015   Chest pain 04/19/2015   Obstructive sleep apnea 03/31/2015   Arthritis    Hyperlipidemia    Hypertension    Depression    Stroke [facility])    GERD (gastroesophageal reflux disease)    Bowel obstruction (HCC) 11/09/2014     Health Maintenance Due  Topic Date Due   DEXA SCAN  11/07/2023   COVID-19 Vaccine (7 - 2025-26 season) 07/10/2024     Review of Systems 12 point ros is otherwise negative Physical Exam   BP 134/87   Pulse 83   Temp 97.6 F (36.4 C) (Oral)   Wt 195 lb (88.5 kg)   SpO2 98%   BMI 31.47 kg/m    Physical Exam  Constitutional:  oriented to person, place, and time. appears well-developed and well-nourished. No distress.  HENT: [place]/AT, PERRLA, no scleral icterus Mouth/Throat: Oropharynx is clear and moist. No oropharyngeal exudate.  Cardiovascular: Normal rate, regular rhythm and normal heart sounds. Exam reveals no gallop and no friction rub.  No murmur heard.   Pulmonary/Chest: Effort normal and breath sounds normal. No respiratory distress.  has no wheezes.  Neck = supple, no nuchal rigidity Abdominal: Soft. Bowel sounds are normal.  exhibits no distension. There is no tenderness.  Lymphadenopathy: no cervical adenopathy. No axillary adenopathy Neurological: alert and oriented to person, place, and time.  Skin: Skin is warm and dry. No rash noted. No erythema.  Psychiatric: a normal mood and affect.  behavior is normal.   Lab Results  Component Value Date  LABRPR NON REACTIVE 08/23/2020    CBC Lab Results  Component Value Date   WBC 4.6 08/25/2024   RBC 4.00 08/25/2024   HGB 11.9 (L) 08/25/2024   HCT 36.2 08/25/2024   PLT 158 08/25/2024   MCV 90.5 08/25/2024   MCH 29.8 08/25/2024   MCHC 32.9 08/25/2024   RDW 12.2 08/25/2024   LYMPHSABS 0.9 08/25/2024   MONOABS 0.5 08/25/2024   EOSABS 0.1 08/25/2024    BMET Lab Results  Component Value Date   NA 142 08/25/2024   K 4.0 08/25/2024   CL 106 08/25/2024   CO2 24 08/25/2024   GLUCOSE 112 (H) 08/25/2024   BUN 15 08/25/2024   CREATININE 0.64 08/25/2024   CALCIUM 9.1 08/25/2024   GFRNONAA >60 08/25/2024   GFRAA >60 08/10/2020      Assessment and Plan IBS-D=  Will refill lomotil  Only recommend 2 wk course of rifaximin . Observe for 1 week off of rifaximin  to see if need to still continue. Reviewed her labs and appears no untorwards effects  Health maintenance = flu and pneumonia- vaccine is up todate.  Has a plan to get covid vaccine at pharmacy next week.  Hx of cdifficile diarrhea= does not appear to need testing at this time  Rtc in 3-4 months  I have personally spent 25 minutes involved in face-to-face and non-face-to-face activities for this patient on the day of the visit. Professional time spent includes the following activities: Preparing to see the patient (review of tests), Obtaining and/or reviewing separately obtained history (admission/discharge record),  Performing a medically appropriate examination and/or evaluation , Ordering medications/tests/procedures, Documenting clinical information in the EMR, Independently interpreting results (not separately reported), Communicating results to the patient/ Counseling and educating the patient.

## 2024-10-03 ENCOUNTER — Inpatient Hospital Stay

## 2024-10-03 ENCOUNTER — Inpatient Hospital Stay: Attending: Hematology & Oncology

## 2024-10-03 ENCOUNTER — Encounter: Payer: Self-pay | Admitting: Hematology & Oncology

## 2024-10-03 ENCOUNTER — Inpatient Hospital Stay: Admitting: Hematology & Oncology

## 2024-10-03 VITALS — BP 144/96 | HR 94 | Temp 97.9°F | Resp 18 | Ht 66.0 in | Wt 195.6 lb

## 2024-10-03 VITALS — BP 156/77 | HR 88 | Resp 17

## 2024-10-03 DIAGNOSIS — C9000 Multiple myeloma not having achieved remission: Secondary | ICD-10-CM | POA: Insufficient documentation

## 2024-10-03 DIAGNOSIS — D500 Iron deficiency anemia secondary to blood loss (chronic): Secondary | ICD-10-CM | POA: Diagnosis not present

## 2024-10-03 DIAGNOSIS — E860 Dehydration: Secondary | ICD-10-CM

## 2024-10-03 LAB — CBC WITH DIFFERENTIAL (CANCER CENTER ONLY)
Abs Immature Granulocytes: 0.02 10*3/uL (ref 0.00–0.07)
Basophils Absolute: 0.1 10*3/uL (ref 0.0–0.1)
Basophils Relative: 1 %
Eosinophils Absolute: 0.1 10*3/uL (ref 0.0–0.5)
Eosinophils Relative: 2 %
HCT: 38.9 % (ref 36.0–46.0)
Hemoglobin: 12.9 g/dL (ref 12.0–15.0)
Immature Granulocytes: 0 %
Lymphocytes Relative: 28 %
Lymphs Abs: 1.7 10*3/uL (ref 0.7–4.0)
MCH: 29.8 pg (ref 26.0–34.0)
MCHC: 33.2 g/dL (ref 30.0–36.0)
MCV: 89.8 fL (ref 80.0–100.0)
Monocytes Absolute: 0.5 10*3/uL (ref 0.1–1.0)
Monocytes Relative: 9 %
Neutro Abs: 3.6 10*3/uL (ref 1.7–7.7)
Neutrophils Relative %: 60 %
Platelet Count: 151 10*3/uL (ref 150–400)
RBC: 4.33 MIL/uL (ref 3.87–5.11)
RDW: 12.3 % (ref 11.5–15.5)
WBC Count: 6 10*3/uL (ref 4.0–10.5)
nRBC: 0 % (ref 0.0–0.2)

## 2024-10-03 LAB — CMP (CANCER CENTER ONLY)
ALT: 32 U/L (ref 0–44)
AST: 27 U/L (ref 15–41)
Albumin: 4.3 g/dL (ref 3.5–5.0)
Alkaline Phosphatase: 58 U/L (ref 38–126)
Anion gap: 12 (ref 5–15)
BUN: 11 mg/dL (ref 8–23)
CO2: 22 mmol/L (ref 22–32)
Calcium: 9.2 mg/dL (ref 8.9–10.3)
Chloride: 108 mmol/L (ref 98–111)
Creatinine: 0.53 mg/dL (ref 0.44–1.00)
GFR, Estimated: >60 mL/min (ref >60)
Glucose, Bld: 115 mg/dL — ABNORMAL HIGH (ref 70–99)
Potassium: 3.8 mmol/L (ref 3.5–5.1)
Sodium: 143 mmol/L (ref 135–145)
Total Bilirubin: 0.5 mg/dL (ref 0.0–1.2)
Total Protein: 6.9 g/dL (ref 6.5–8.1)

## 2024-10-03 LAB — LACTATE DEHYDROGENASE: LDH: 286 U/L — ABNORMAL HIGH (ref 105–235)

## 2024-10-03 MED ORDER — ACYCLOVIR 400 MG PO TABS: 400.0000 mg | ORAL_TABLET | Freq: Two times a day (BID) | ORAL | 6 refills | Status: AC

## 2024-10-03 MED ORDER — SODIUM CHLORIDE 0.9 % IV SOLN: INTRAVENOUS | Status: DC

## 2024-10-03 NOTE — Patient Instructions (Signed)
 Dehydration, Adult Dehydration is a condition in which there is not enough water or other fluids in the body. This happens when a person loses more fluids than they take in. Important organs cannot work right without the right amount of fluids. Any loss of fluids from the body can cause dehydration. Dehydration can be mild, worse, or very bad. It should be treated right away to keep it from getting very bad. What are the causes? Conditions that cause loss of water in the body. They include: Watery poop (diarrhea). Vomiting. Sweating a lot. Fever. Infection. Peeing (urinating) a lot. Not drinking enough fluids. Certain medicines, such as medicines that take extra fluid out of the body (diuretics). Lack of safe drinking water. Not being able to get enough water and food. What increases the risk? Having a long-term (chronic) illness that has not been treated the right way, such as: Diabetes. Heart disease. Kidney disease. Being 25 years of age or older. Having a disability. Living in a place that is high above the ground or sea (high in altitude). The thinner, drier air causes more fluid loss. Doing exercises that put stress on your body for a long time. Being active when in hot places. What are the signs or symptoms? Symptoms of dehydration depend on how bad it is. Mild or worse dehydration Thirst. Dry lips or dry mouth. Feeling dizzy or light-headed. Muscle cramps. Passing little pee or dark pee. Pee may be the color of tea. Headache. Very bad dehydration Changes in skin. Skin may: Be cold to the touch (clammy). Be blotchy or pale. Not go back to normal right after you pinch it and let it go. Little or no tears, pee, or sweat. Fast breathing. Low blood pressure. Weak pulse. Pulse that is more than 100 beats a minute when you are sitting still. Other changes, such as: Feeling very thirsty. Eyes that look hollow (sunken). Cold hands and feet. Being confused. Being very  tired (lethargic) or having trouble waking from sleep. Losing weight. Loss of consciousness. How is this treated? Treatment for this condition depends on how bad your dehydration is. Treatment should start right away. Do not wait until your condition gets very bad. Very bad dehydration is an emergency. You will need to go to a hospital. Mild or worse dehydration can be treated at home. You may be asked to: Drink more fluids. Drink an oral rehydration solution (ORS). This drink gives you the right amount of fluids, salts, and minerals (electrolytes). Very bad dehydration can be treated: With fluids through an IV tube. By correcting low levels of electrolytes in the body. By treating the problem that caused your dehydration. Follow these instructions at home: Oral rehydration solution If told by your doctor, drink an ORS: Make an ORS. Use instructions on the package. Start by drinking small amounts, about  cup (120 mL) every 5-10 minutes. Slowly drink more until you have had the amount that your doctor said to have.  Eating and drinking  Drink enough clear fluid to keep your pee pale yellow. If you were told to drink an ORS, finish the ORS first. Then, start slowly drinking other clear fluids. Drink fluids such as: Water. Do not drink only water. Doing that can make the salt (sodium) level in your body get too low. Water from ice chips you suck on. Fruit juice that you have added water to (diluted). Low-calorie sports drinks. Eat foods that have the right amounts of salts and minerals, such as bananas, oranges, potatoes,  tomatoes, or spinach. Do not drink alcohol. Avoid drinks that have caffeine or sugar. These include:: High-calorie sports drinks. Fruit juice that you did not add water to. Soda. Coffee or energy drinks. Avoid foods that are greasy or have a lot of fat or sugar. General instructions Take over-the-counter and prescription medicines only as told by your doctor. Do  not take sodium tablets. Doing that can make the salt level in your body get too high. Return to your normal activities as told by your doctor. Ask your doctor what activities are safe for you. Keep all follow-up visits. Your doctor may check and change your treatment. Contact a doctor if: You have pain in your belly (abdomen) and the pain: Gets worse. Stays in one place. You have a rash. You have a stiff neck. You get angry or annoyed more easily than normal. You are more tired or have a harder time waking than normal. You feel weak or dizzy. You feel very thirsty. Get help right away if: You have any symptoms of very bad dehydration. You vomit every time you eat or drink. Your vomiting gets worse, does not go away, or you vomit blood or green stuff. You are getting treatment, but symptoms are getting worse. You have a fever. You have a very bad headache. You have: Diarrhea that gets worse or does not go away. Blood in your poop (stool). This may cause poop to look black and tarry. No pee in 6-8 hours. Only a small amount of pee in 6-8 hours, and the pee is very dark. You have trouble breathing. These symptoms may be an emergency. Get help right away. Call 911. Do not wait to see if the symptoms will go away. Do not drive yourself to the hospital. This information is not intended to replace advice given to you by your health care provider. Make sure you discuss any questions you have with your health care provider. Document Revised: 05/25/2022 Document Reviewed: 05/25/2022 Elsevier Patient Education  2024 [employer].

## 2024-10-03 NOTE — Patient Instructions (Signed)

## 2024-10-03 NOTE — Progress Notes (Signed)
 Hematology and Oncology Follow Up Visit  Tami [name] [id number] 1956/06/27 68 y.o. 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  She continues to do quite well.  She will be making Thanksgiving dinner for her family.  She has not had any problems with diarrhea.  I think she still may be on Xifaxin.  She does take Lomotil  on occasion.  Her myeloma really has not been a problem.  She has had no treatment now for probably a year and a half.  Her last monoclonal spike was 0.2 g/dL.  Her IgG level was 750 mg/dL.  Her kappa light chain is 0.6 mg/dL.    She has had no cough.  She has had no bleeding.  She has had no cardiac issues.  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  Overall, I will say that her performance status is probably ECOG 1.    Medications:  Allergies as of 10/03/2024       Reactions   Benazepril  Anaphylaxis, Swelling, Hives   angioedema Throat and lip swelling   Ondansetron  Hcl Hives   Redness and hives post IV admin on 07/05/17   Codeine Nausea And Vomiting   Morphine Hives   Redness and hives noted post IV admin on 07/05/17        Medication List        Accurate as of October 03, 2024  8:23 AM. If you have any questions, ask your nurse or doctor.          acetaminophen  500 MG tablet Commonly known as: TYLENOL  Take 500 mg by mouth every 6 (six) hours as needed.   acyclovir  400 MG tablet Commonly known as: ZOVIRAX  TAKE ONE (1) TABLET BY MOUTH TWO (2)  TIMES DAILY   albuterol  (2.5 MG/3ML) 0.083% nebulizer solution Commonly known as: PROVENTIL  Take 3 mLs (2.5 mg total) by nebulization every 6 (six) hours as needed for wheezing or shortness of breath.   albuterol  108 (90 Base) MCG/ACT inhaler Commonly known as: VENTOLIN  HFA Inhale 2 puffs into the lungs every 6 (six) hours as needed for wheezing or shortness of breath.   amitriptyline  25 MG tablet Commonly known as: ELAVIL  Take 1 tablet (25 mg total) by mouth at bedtime.   amLODipine  5 MG tablet Commonly known as: NORVASC  Take 5 mg by mouth daily.   aspirin  EC 81 MG tablet Take 81 mg by mouth daily. Swallow whole.   budesonide  0.5 MG/2ML nebulizer solution Commonly known as: Pulmicort  Take 2 mLs (0.5 mg total) by nebulization 2 (two) times daily as needed (cough, wheezing or shortness of breath).   CENTRUM SILVER  WOMEN 50+ PO Take 1 tablet by mouth daily at 6 (six) AM.   cetirizine  10 MG tablet Commonly known as: ZYRTEC  Take 10 mg by mouth at bedtime.   cholestyramine  4 g packet Commonly known as: Questran  Take 1 packet by mouth daily. Separate from other medications by at least 2 hours.  Start at daily and can increase to twice daily if no improvement after 7 days.   cholestyramine  light 4 g packet Commonly known as: PREVALITE  Take 4 g by mouth 2 (two) times daily.   cyclobenzaprine  5 MG tablet Commonly known as: FLEXERIL  Take 1 tablet (5 mg total) by mouth 2 (two) times daily as needed for up to 10 doses for muscle spasms.   dicyclomine  20 MG tablet Commonly known as: BENTYL  Take 1 tablet (20 mg total) by mouth 3 (three) times daily before meals.   diphenhydrAMINE  25 MG tablet Commonly known as: BENADRYL  Take 25 mg by mouth.   diphenoxylate -atropine  2.5-0.025 MG tablet Commonly known as: Lomotil  Take 1 tablet by mouth 2 (two) times daily.   estradiol  0.1 MG/GM vaginal cream Commonly known as: ESTRACE  Place 0.5 g vaginally 2 (two) times a week. Place 0.5g  nightly for two weeks then twice a week after   famotidine  20 MG tablet Commonly known as: PEPCID  Take 1 tablet (20 mg total) by mouth at bedtime.   ferrous sulfate 325 (65 FE) MG tablet Commonly known as: FeroSul Take 1 tablet (325 mg total) by mouth daily with breakfast.   fluticasone  50 MCG/ACT nasal spray Commonly known as: FLONASE  Place 2 sprays into both nostrils daily.   furosemide  20 MG tablet Commonly known as: LASIX  Take 1 tablet (20 mg total) by mouth daily as needed (fluid retention (feet swelling)).   Gemtesa  75 MG Tabs Generic drug: Vibegron  Take 1 tablet (75 mg total) by mouth daily.   hydrOXYzine  10 MG tablet Commonly known as: ATARAX  TAKE ONE (1) TABLET BY MOUTH 3 TIMES DAILY AS NEEDED FOR ITCHING   hyoscyamine  0.125 MG SL tablet Commonly known as: LEVSIN  SL Place 1 tablet (0.125 mg total) under the tongue every 6 (six) hours as needed.   lidocaine  5 % ointment Commonly known as: XYLOCAINE  Apply 1 Application topically as needed.   lidocaine -prilocaine  cream Commonly known as: EMLA  Apply 1 Application topically as needed.   loperamide  2 MG capsule Commonly known as: IMODIUM  Take 4 mg by mouth in the morning and at bedtime.   losartan  50 MG tablet Commonly known as: COZAAR  Take 50 mg by mouth in the morning and at bedtime.   Magnesium  Glycinate 100 MG Caps Take 200 mg by mouth at bedtime.   meclizine  12.5 MG tablet Commonly known as: ANTIVERT  Take 12.5 mg by mouth 2 (two) times daily as needed for dizziness or nausea.   metoprolol  succinate 50 MG 24 hr tablet Commonly known as: TOPROL -XL Take 1 tablet (50 mg total) by mouth 2 (two) times daily.   montelukast  10 MG tablet Commonly known as: Singulair  Take 1 tablet (10 mg total) by mouth at bedtime. Please start taking this on September 4.   neomycin -polymyxin-hydrocortisone OTIC solution Commonly known as: CORTISPORIN Place 4 drops into both ears 4 (four) times daily.    oxyCODONE -acetaminophen  5-325 MG tablet Commonly known as: Percocet Take 2 tablets by mouth every 6 (six) hours as needed.   pantoprazole  40 MG tablet Commonly known as: PROTONIX  Take 1 tablet (40 mg total) by mouth daily.   phenazopyridine  200 MG tablet Commonly known as: Pyridium  Take 1 tablet (200 mg total) by mouth 3 (three) times daily as needed for pain.   phenazopyridine  100 MG tablet Commonly known as: Pyridium  Take 1 tablet (100 mg total) by mouth 3 (three) times daily as needed for pain.   Pillow Mask/Adult Misc   potassium chloride  SA 20 MEQ tablet  Commonly known as: KLOR-CON  M Take 1 tablet (20 mEq total) by mouth daily.   prochlorperazine  10 MG tablet Commonly known as: COMPAZINE  Take 0.5 tablets (5 mg total) by mouth 2 (two) times daily as needed for nausea or vomiting.   rifaximin  200 MG tablet Commonly known as: Xifaxan  Take 1 tablet (200 mg total) by mouth 2 (two) times daily.   scopolamine  1 MG/3DAYS Commonly known as: TRANSDERM-SCOP Place 1 patch (1.5 mg total) onto the skin every 3 (three) days.   spironolactone  25 MG tablet Commonly known as: ALDACTONE  Take 25 mg by mouth daily.   sucralfate  1 GM/10ML suspension Commonly known as: Carafate  Take 10 mLs (1 g total) by mouth 4 (four) times daily -  with meals and at bedtime.   terbinafine 250 MG tablet Commonly known as: LAMISIL Take 250 mg by mouth daily.   triamcinolone  cream 0.5 % Commonly known as: KENALOG  Apply topically.   Vitamin D3 50 MCG (2000 UT) Tabs Take 2,000 Units by mouth in the morning.        Allergies:  Allergies  Allergen Reactions   Benazepril  Anaphylaxis, Swelling and Hives    angioedema Throat and lip swelling   Ondansetron  Hcl Hives    Redness and hives post IV admin on 07/05/17   Codeine Nausea And Vomiting   Morphine Hives    Redness and hives noted post IV admin on 07/05/17    Past Medical History, Surgical history, Social history, and Family History  were reviewed and updated.  Review of Systems: Review of Systems  Constitutional: Negative.   HENT: Negative.    Eyes: Negative.   Respiratory: Negative.    Cardiovascular: Negative.   Gastrointestinal:  Positive for abdominal pain, diarrhea and nausea.  Musculoskeletal:  Positive for joint pain and myalgias.  Skin: Negative.   Neurological: Negative.   Endo/Heme/Allergies: Negative.   Psychiatric/Behavioral: Negative.       Physical Exam:  Her temperature is 97.9.  Pulse 76.  Blood pressure 136/90.  Weight is 195 pounds.  Wt Readings from Last 3 Encounters:  09/22/24 195 lb (88.5 kg)  08/25/24 195 lb (88.5 kg)  08/24/24 195 lb (88.5 kg)    Physical Exam Vitals reviewed.  HENT:     Head: Normocephalic and atraumatic.  Eyes:     Pupils: Pupils are equal, round, and reactive to light.  Cardiovascular:     Rate and Rhythm: Normal rate and regular rhythm.     Heart sounds: Normal heart sounds.  Pulmonary:     Effort: Pulmonary effort is normal.     Breath sounds: Normal breath sounds.  Abdominal:     General: Bowel sounds are normal.     Palpations: Abdomen is soft.  Musculoskeletal:        General: No tenderness or deformity. Normal range of motion.     Cervical back: Normal range of motion.  Lymphadenopathy:     Cervical: No cervical adenopathy.  Skin:    General: Skin is warm and dry.     Findings: No erythema or rash.  Neurological:     Mental Status: She is alert and oriented to person, place, and time.  Psychiatric:        Behavior: Behavior normal.        Thought Content: Thought content normal.        Judgment: Judgment normal.      Lab Results  Component Value Date   WBC 6.0 10/03/2024   HGB 12.9 10/03/2024  HCT 38.9 10/03/2024   MCV 89.8 10/03/2024   PLT 151 10/03/2024   Lab Results  Component Value Date   FERRITIN 166 11/26/2023   IRON  76 11/26/2023   TIBC 385 11/26/2023   UIBC 309 11/26/2023   IRONPCTSAT 20 11/26/2023   Lab Results   Component Value Date   RETICCTPCT 1.2 11/27/2022   RBC 4.33 10/03/2024   RETICCTABS 32,640 07/16/2016   Lab Results  Component Value Date   KPAFRELGTCHN 6.3 08/25/2024   LAMBDASER 2.6 (L) 08/25/2024   KAPLAMBRATIO 2.42 (H) 08/25/2024   Lab Results  Component Value Date   IGGSERUM 728 08/25/2024   IGGSERUM 785 08/25/2024   IGA 56 (L) 08/25/2024   IGA 62 (L) 08/25/2024   IGMSERUM 28 08/25/2024   IGMSERUM 32 08/25/2024   Lab Results  Component Value Date   TOTALPROTELP 6.1 08/25/2024   ALBUMINELP 3.5 08/25/2024   A1GS 0.2 08/25/2024   A2GS 0.9 08/25/2024   BETS 1.0 08/25/2024   BETA2SER 0.5 07/16/2016   GAMS 0.5 08/25/2024   MSPIKE 0.2 (H) 08/25/2024   SPEI Comment 03/22/2024     Chemistry      Component Value Date/Time   NA 142 08/25/2024 0748   NA 143 11/08/2017 1127   NA 140 12/31/2016 1000   K 4.0 08/25/2024 0748   K 4.0 11/08/2017 1127   K 3.6 12/31/2016 1000   CL 106 08/25/2024 0748   CL 107 11/08/2017 1127   CO2 24 08/25/2024 0748   CO2 25 11/08/2017 1127   CO2 24 12/31/2016 1000   BUN 15 08/25/2024 0748   BUN 19 11/08/2017 1127   BUN 22.3 12/31/2016 1000   CREATININE 0.64 08/25/2024 0748   CREATININE 0.63 03/15/2024 0947   CREATININE 0.8 12/31/2016 1000      Component Value Date/Time   CALCIUM 9.1 08/25/2024 0748   CALCIUM 9.9 11/08/2017 1127   CALCIUM 9.5 12/31/2016 1000   ALKPHOS 62 08/25/2024 0748   ALKPHOS 44 11/08/2017 1127   ALKPHOS 51 12/31/2016 1000   AST 25 08/25/2024 0748   AST 28 12/31/2016 1000   ALT 24 08/25/2024 0748   ALT 36 11/08/2017 1127   ALT 16 12/31/2016 1000   BILITOT 0.5 08/25/2024 0748   BILITOT 0.46 12/31/2016 1000       Impression and Plan: Ms. Khun is a very pleasant 68 yo African American female with a previous IgG kappa MGUS with progression to myeloma. Again, she has responded very nicely to treatment.  We will continue to hold off on treating her myeloma for right now.  Again her quality of life is better  given that the diarrhea is better.  We will continue her on the Xifaxan .  She will get some IV fluid in the office today.  I want to make sure that we have her well-hydrated for the Thanksgiving weekend coming up.  I know that this does make her feel better.  We will now plan to get her back after the Holiday season.  She has done incredibly well.  I am very happy that her quality life is doing nicely.   Maude JONELLE Crease, MD 11/25/20258:23 AM

## 2024-10-04 LAB — KAPPA/LAMBDA LIGHT CHAINS
Kappa free light chain: 5.1 mg/L (ref 3.3–19.4)
Kappa, lambda light chain ratio: 2.04 — ABNORMAL HIGH (ref 0.26–1.65)
Lambda free light chains: 2.5 mg/L — ABNORMAL LOW (ref 5.7–26.3)

## 2024-10-04 LAB — IGG, IGA, IGM
IgA: 69 mg/dL — ABNORMAL LOW (ref 87–352)
IgG (Immunoglobin G), Serum: 833 mg/dL (ref 586–1602)
IgM (Immunoglobulin M), Srm: 41 mg/dL (ref 26–217)

## 2024-10-09 ENCOUNTER — Encounter: Payer: Self-pay | Admitting: Hematology & Oncology

## 2024-10-10 ENCOUNTER — Ambulatory Visit: Payer: Self-pay | Admitting: Hematology & Oncology

## 2024-10-10 LAB — PROTEIN ELECTROPHORESIS, SERUM, WITH REFLEX
A/G Ratio: 1.3 (ref 0.7–1.7)
Albumin ELP: 3.7 g/dL (ref 2.9–4.4)
Alpha-1-Globulin: 0.2 g/dL (ref 0.0–0.4)
Alpha-2-Globulin: 0.9 g/dL (ref 0.4–1.0)
Beta Globulin: 1 g/dL (ref 0.7–1.3)
Gamma Globulin: 0.7 g/dL (ref 0.4–1.8)
Globulin, Total: 2.9 g/dL (ref 2.2–3.9)
M-Spike, %: 0.3 g/dL — ABNORMAL HIGH
SPEP Interpretation: 0
Total Protein ELP: 6.6 g/dL (ref 6.0–8.5)

## 2024-10-10 LAB — IMMUNOFIXATION REFLEX, SERUM
IgA: 70 mg/dL — ABNORMAL LOW (ref 87–352)
IgG (Immunoglobin G), Serum: 905 mg/dL (ref 586–1602)
IgM (Immunoglobulin M), Srm: 44 mg/dL (ref 26–217)

## 2024-10-11 ENCOUNTER — Encounter: Payer: Self-pay | Admitting: Gastroenterology

## 2024-10-11 ENCOUNTER — Ambulatory Visit: Admitting: Gastroenterology

## 2024-10-11 VITALS — BP 150/86 | HR 84 | Ht 64.0 in | Wt 200.2 lb

## 2024-10-11 DIAGNOSIS — R112 Nausea with vomiting, unspecified: Secondary | ICD-10-CM

## 2024-10-11 DIAGNOSIS — R1013 Epigastric pain: Secondary | ICD-10-CM

## 2024-10-11 DIAGNOSIS — R197 Diarrhea, unspecified: Secondary | ICD-10-CM

## 2024-10-11 MED ORDER — DICYCLOMINE HCL 20 MG PO TABS: 20.0000 mg | ORAL_TABLET | Freq: Three times a day (TID) | ORAL | 4 refills | Status: AC | PRN

## 2024-10-11 MED ORDER — AMITRIPTYLINE HCL 25 MG PO TABS: 25.0000 mg | ORAL_TABLET | Freq: Every day | ORAL | 6 refills | Status: AC

## 2024-10-11 NOTE — Progress Notes (Signed)
 Chief Complaint: FU  Referring Provider:  Domenica Harlene LABOR, MD      ASSESSMENT AND PLAN;   #1. Epi pain with N/V (resolved). Neg extensive eval including multiple EGDs/CT. Most recent neg EGD 07/03/2024 with neg bx.  #2. Diarrhea (resolved after stopping Mg) - getting fluids every week. Neg stool for C Diff, calprotectin. Neg colon to TI with Bx Feb 2025. No wt loss. Prev Dx IBS-D  Plan: - Continue bentyl  20mg  po TID prn #90, 4RF - Continue Cholestramine 4g po every day in apple sauce.  - Continue Amitryptline 25mg  po at bedtime #30, 11RF - Continue Zofran  4mg  po Q8hrs prn. - Stop Carafate , other antinausea medications and Lomotil . - FU in 6 months.  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 history of small bowel obstruction secondary to adhesions, hysterectomy, cholecystectomy.    Seen for diarrhea and abdominal pain.  Had negative extensive workup as detailed below   Magnesium  was stopped.  She was started on cholestyramine  4 g p.o. daily   Feels much better   No further diarrhea.  Denies having any significant abdominal pain.  Nausea/vomiting has resolved.  She denies having any further epigastric pain.     She is pleased with the progress.  Has not been to emergency room ever since.     Wt Readings from Last 3 Encounters:  10/11/24 200 lb 4 oz (90.8 kg)  10/03/24 195 lb 9.6 oz (88.7 kg)  09/22/24 195 lb (88.5 kg)    Previous GI Endoscopies / Labs / Imaging:   EGD 07/03/2024 - Normal esophagus. - Z- line regular, 37 cm from the incisors. - Normal stomach. Biopsied. - Gastroesophageal flap valve classified as Hill Grade I ( prominent fold, tight to endoscope) . - Normal examined duodenum. Biopsied. - Made several passes through the esophagus, stomach, and proximal small  bowel. No areas of inflammation or obstruction. Pylorus is widely patent and easily traversable. -Negative small bowel biopsies for celiac, negative gastric biopsies for H. pylori.    EGD/colonoscopy 12/13/23: Esophagus: The GE junction,diaphragm impression and top of gastric folds were ~ 42 cm from incisors. There was no evidence of esophagitis, Barrett's,varices,Schatzki ring or stricture.  Stomach: Normal examination of the stomach. On retroflexion, hiatus hill grade was 1.  Duodenum: Normal examination of visualized portions of duodenum. We obtained duodenal biopsies to evaluate for celiac disease.  External and internal medium hemorrhoids Diverticula of mild severity Otherwise, normal examination of cecum,ascending,transverse,descending,sigmoid colon and rectum. Terminal ileum was intubated and distal 10 cm appeared normal. We took random colon biopsies to evaluate for microscopic colitis. Final Diagnosis  A. TISSUE LABELED DUODENUM, BIOPSIES: SMALL BOWEL MUCOSA SHOWING NO SIGNIFICANT HISTOPATHOLOGIC CHANGES. THE FEATURES OF CELIAC SPRUE ARE NOT IDENTIFIED.  B. TISSUE LABELED RANDOM COLON BIOPSY, BIOPSIES: COLONIC MUCOSA SHOWING NO SIGNIFICANT HISTOPATHOLOGIC CHANGES.     EGD and colonoscopy October 2023 EGD -LA grade a reflux esophagitis.  Biopsied.  Normal stomach.  Biopsied.  Normal examined duodenum.  Biopsied. Colonoscopy  -Entire examined colon normal.  Biopsied.  Minimal sigmoid diverticulosis.  Examined portion of the ileum was normal.  Deep terminal ileal intubation was performed.  Biopsy.  Nonbleeding internal hemorrhoids.  Examination was otherwise normal..    Postprocedure she was given a trial of Bentyl .  Symptoms felt to  be anxiety related   FINAL MICROSCOPIC DIAGNOSIS:  A. SMALL BOWEL, BIOPSY: - Benign small bowel mucosa with no significant pathologic changes  B. STOMACH, BIOPSY: - Gastric antral and oxyntic mucosa with no specific pathologic changes - Negative  for H. pylori on HE stain - Negative for intestinal metaplasia or malignancy  C. ESOPHAGUS, DISTAL, BIOPSY: - Benign squamous mucosa with reflux changes - Gastric cardiac mucosa with no specific pathologic changes - Negative for intestinal metaplasia, dysplasia or malignancy  D. TERMINAL ILEUM, BIOPSY: - Benign small bowel mucosa with no significant pathologic changes  E. COLON, RANDOM, BIOPSY: - Benign colonic mucosa with no specific pathologic changes - Negative for increased intraepithelial lymphocytes or thickened subepithelial collagen table - Negative for dysplasia or malignancy      Imaging:   CTA A/P oi7/2025 IMPRESSION: 1. No evidence of mesenteric ischemia. 2. No acute abnormality. 3. Borderline cardiomegaly.    03/02/24 CTAP IMPRESSION: 1. No specific lytic lesions characteristic of myeloma identified. 2. There is some formed stool in relatively small caliber distal colon. No dilated bowel. 3. Mild cardiomegaly. 4. 7 mm degenerative anterolisthesis at L4-5 with resulting mild bilateral foraminal stenosis at L4-5. 5. Moderate degenerative chondral thinning in both hips. 05/20/24 CT Angio Abd IMPRESSION: 1. No evidence of mesenteric ischemia. 2. No acute abnormality. 3. Borderline cardiomegaly.   12/27/2023 US  abdomen pelvic Doppler No evidence of any critical stenosis throughout celiac, hepatic, splenic, superior and inferior mesenteric arteries. Study does not exclude embolic or branch level disease.    CT CAP with contrast 12/05/23: 1. No acute abnormality identified within the chest. No acute inflammatory process identified within the abdomen or pelvis. 2. No metastatic disease identified within the chest, abdomen or pelvis. 3. Multiple other nonacute observations, as described above.  CTAP with contrast 11/05/23: No cause can be seen for the upper abdominal pain. No acute finding.   CTAP with contrast 10/18/23: 1. No acute abnormality. No evidence of  bowel obstruction or acute  bowel inflammation. Normal appendix.  2. Minimal left colonic diverticulosis.  3. Finely irregular liver surface, cannot exclude cirrhosis. Suggest  correlation with liver function tests.  4. Mild cardiomegaly.   CT head wo contrast 09/22/23: No evidence of acute intracranial abnormality.   CTA chest 09/18/23: 1. No evidence of pulmonary embolism or acute cardiopulmonary disease. 2. Mild cardiomegaly.  CTAP with contrast 08/16/23: 1. No acute CT findings of the abdomen or pelvis to explain pain or diarrhea. 2. Status post cholecystectomy and hysterectomy. 3. Pelvic floor prolapse. 4. No evidence of lymphadenopathy or metastatic disease in the abdomen or pelvis.   03/25/2023 US  abd complete 1. Evidence of hepatic steatosis. 2. Prior cholecystectomy without evidence of biliary ductal dilatation. 3. Benign-appearing hepatic and bilateral renal cysts.  Past Medical History:  Diagnosis Date   Abnormal SPEP 07/26/2016   Arthritis    knees, hands   Back pain 07/14/2016   Bowel obstruction (HCC) 11/2014   C. 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   Multiple  myeloma (HCC) 07/20/2018   Multiple myeloma not having achieved remission (HCC) 07/20/2018   Myalgia 12/31/2016   Obstructive sleep apnea 03/31/2015   Presence of permanent cardiac pacemaker    SOB (shortness of breath) 04/09/2016   Stroke (HCC)    TIAs   TIA (transient ischemic attack)    UTI (urinary tract infection)     Past Surgical  History:  Procedure Laterality Date   ABDOMINAL HYSTERECTOMY     menorraghia, 2006, total   BIOPSY  09/01/2022   Procedure: BIOPSY;  Surgeon: Charlanne Groom, MD;  Location: [facility];  Service: Gastroenterology;;   CHOLECYSTECTOMY     COLONOSCOPY  2018   [facility] per patient   COLONOSCOPY WITH PROPOFOL  N/A 09/01/2022   Procedure: COLONOSCOPY WITH PROPOFOL ;  Surgeon: Charlanne Groom, MD;  Location: [facility];  Service: Gastroenterology;  Laterality: N/A;   ESOPHAGOGASTRODUODENOSCOPY  2018   [facility]    ESOPHAGOGASTRODUODENOSCOPY N/A 07/03/2024   Procedure: EGD (ESOPHAGOGASTRODUODENOSCOPY);  Surgeon: San Sandor GAILS, DO;  Location: [facility];  Service: Gastroenterology;  Laterality: N/A;   ESOPHAGOGASTRODUODENOSCOPY (EGD) WITH PROPOFOL  N/A 09/01/2022   Procedure: ESOPHAGOGASTRODUODENOSCOPY (EGD) WITH PROPOFOL ;  Surgeon: Charlanne Groom, MD;  Location: [facility];  Service: Gastroenterology;  Laterality: N/A;   IR IMAGING GUIDED PORT INSERTION  06/29/2023   KNEE SURGERY     right, repair torn torn cartialage   PACEMAKER INSERTION  08/2014   changed last in 2023 per pt at [facility]   TONSILLECTOMY     TUBAL LIGATION      Family History  Problem Relation Age of Onset   Diabetes Mother    Hypertension Mother    Heart disease Mother        s/p 1 stent   Hyperlipidemia Mother    Arthritis Mother    Colon cancer Father 43   Irritable bowel syndrome Sister    Hypertension Maternal Grandmother    Arthritis Maternal Grandmother    Heart disease Maternal Grandfather        MI   Hypertension Maternal Grandfather    Arthritis Maternal Grandfather    Hyperlipidemia Daughter    Hypertension Daughter    Hypertension Son    Esophageal cancer Neg Hx    Uterine cancer Neg Hx    Pancreatic cancer Neg Hx     Social History   Tobacco Use   Smoking status: Never    Passive exposure: Never   Smokeless tobacco: Never  Vaping Use   Vaping status: Never  Used  Substance Use Topics   Alcohol use: No   Drug use: No    Current Outpatient Medications  Medication Sig Dispense Refill   acetaminophen  (TYLENOL ) 500 MG tablet Take 500 mg by mouth every 6 (six) hours as needed.     acyclovir  (ZOVIRAX ) 400 MG tablet Take 1 tablet (400 mg total) by mouth 2 (two) times daily. 60 tablet 6   albuterol  (PROVENTIL ) (2.5 MG/3ML) 0.083% nebulizer solution Take 3 mLs (2.5 mg total) by nebulization every 6 (six) hours as needed for wheezing or shortness of breath. 150 mL 2   albuterol  (VENTOLIN  HFA) 108 (90 Base) MCG/ACT inhaler Inhale 2 puffs into the lungs every 6 (six) hours as needed for wheezing or shortness of breath. 18 g 5   amitriptyline  (ELAVIL ) 25 MG tablet Take 1 tablet (25 mg total) by mouth at bedtime. 30 tablet 3   amLODipine  (NORVASC ) 5 MG tablet Take 5 mg by mouth daily.     aspirin  EC 81  MG tablet Take 81 mg by mouth daily. Swallow whole.     budesonide  (PULMICORT ) 0.5 MG/2ML nebulizer solution Take 2 mLs (0.5 mg total) by nebulization 2 (two) times daily as needed (cough, wheezing or shortness of breath). 120 mL 11   cetirizine  (ZYRTEC ) 10 MG tablet Take 10 mg by mouth at bedtime.     Cholecalciferol (VITAMIN D3) 50 MCG (2000 UT) TABS Take 2,000 Units by mouth in the morning.     cholestyramine  (QUESTRAN ) 4 g packet Take 1 packet by mouth daily. Separate from other medications by at least 2 hours. Start at daily and can increase to twice daily if no improvement after 7 days. 60 each 11   cholestyramine  light (PREVALITE ) 4 g packet Take 4 g by mouth 2 (two) times daily.     cyclobenzaprine  (FLEXERIL ) 5 MG tablet Take 1 tablet (5 mg total) by mouth 2 (two) times daily as needed for up to 10 doses for muscle spasms. 10 tablet 0   dicyclomine  (BENTYL ) 20 MG tablet Take 1 tablet (20 mg total) by mouth 3 (three) times daily before meals. 90 tablet 2   diphenhydrAMINE  (BENADRYL ) 25 MG tablet Take 25 mg by mouth.     diphenoxylate -atropine  (LOMOTIL )  2.5-0.025 MG tablet Take 1 tablet by mouth 2 (two) times daily. 90 tablet 4   estradiol  (ESTRACE ) 0.1 MG/GM vaginal cream Place 0.5 g vaginally 2 (two) times a week. Place 0.5g nightly for two weeks then twice a week after 30 g 3   famotidine  (PEPCID ) 20 MG tablet Take 1 tablet (20 mg total) by mouth at bedtime. 30 tablet 2   ferrous sulfate (FEROSUL) 325 (65 FE) MG tablet Take 1 tablet (325 mg total) by mouth daily with breakfast. 90 tablet 0   fluticasone  (FLONASE ) 50 MCG/ACT nasal spray Place 2 sprays into both nostrils daily. 16 g 0   furosemide  (LASIX ) 20 MG tablet Take 1 tablet (20 mg total) by mouth daily as needed (fluid retention (feet swelling)). 30 tablet 1   hydrOXYzine  (ATARAX ) 10 MG tablet TAKE ONE (1) TABLET BY MOUTH 3 TIMES DAILY AS NEEDED FOR ITCHING 30 tablet 2   hyoscyamine  (LEVSIN  SL) 0.125 MG SL tablet Place 1 tablet (0.125 mg total) under the tongue every 6 (six) hours as needed. 30 tablet 2   lidocaine  (XYLOCAINE ) 5 % ointment Apply 1 Application topically as needed. 35.44 g 0   lidocaine -prilocaine  (EMLA ) cream Apply 1 Application topically as needed. 30 g 0   loperamide  (IMODIUM ) 2 MG capsule Take 4 mg by mouth in the morning and at bedtime.     losartan  (COZAAR ) 50 MG tablet Take 50 mg by mouth in the morning and at bedtime.     Magnesium  Glycinate 100 MG CAPS Take 200 mg by mouth at bedtime.     meclizine  (ANTIVERT ) 12.5 MG tablet Take 12.5 mg by mouth 2 (two) times daily as needed for dizziness or nausea.     metoprolol  succinate (TOPROL -XL) 50 MG 24 hr tablet Take 1 tablet (50 mg total) by mouth 2 (two) times daily. 60 tablet 2   montelukast  (SINGULAIR ) 10 MG tablet Take 1 tablet (10 mg total) by mouth at bedtime. Please start taking this on September 4. 30 tablet 3   Multiple Vitamins-Minerals (CENTRUM SILVER  WOMEN 50+ PO) Take 1 tablet by mouth daily at 6 (six) AM.     neomycin -polymyxin-hydrocortisone (CORTISPORIN) OTIC solution Place 4 drops into both ears 4 (four)  times daily. 10 mL 4  oxyCODONE -acetaminophen  (PERCOCET) 5-325 MG tablet Take 2 tablets by mouth every 6 (six) hours as needed. 10 tablet 0   pantoprazole  (PROTONIX ) 40 MG tablet Take 1 tablet (40 mg total) by mouth daily. 30 tablet 3   phenazopyridine  (PYRIDIUM ) 100 MG tablet Take 1 tablet (100 mg total) by mouth 3 (three) times daily as needed for pain. 10 tablet 0   phenazopyridine  (PYRIDIUM ) 200 MG tablet Take 1 tablet (200 mg total) by mouth 3 (three) times daily as needed for pain. 30 tablet 0   potassium chloride  SA (KLOR-CON  M) 20 MEQ tablet Take 1 tablet (20 mEq total) by mouth daily. 30 tablet 5   prochlorperazine  (COMPAZINE ) 10 MG tablet Take 0.5 tablets (5 mg total) by mouth 2 (two) times daily as needed for nausea or vomiting. 60 tablet 1   Respiratory Therapy Supplies (PILLOW MASK/ADULT) MISC      rifaximin  (XIFAXAN ) 200 MG tablet Take 1 tablet (200 mg total) by mouth 2 (two) times daily. 60 tablet 4   scopolamine  (TRANSDERM-SCOP) 1 MG/3DAYS Place 1 patch (1.5 mg total) onto the skin every 3 (three) days. (Patient taking differently: Place 1 patch (1.5 mg total) onto the skin every 3 (three) days.) 10 patch 1   spironolactone  (ALDACTONE ) 25 MG tablet Take 25 mg by mouth daily.     sucralfate  (CARAFATE ) 1 GM/10ML suspension Take 10 mLs (1 g total) by mouth 4 (four) times daily -  with meals and at bedtime. 473 mL 1   terbinafine (LAMISIL) 250 MG tablet Take 250 mg by mouth daily.     triamcinolone  cream (KENALOG ) 0.5 % Apply topically.     Vibegron  (GEMTESA ) 75 MG TABS Take 1 tablet (75 mg total) by mouth daily. 30 tablet 2   Current Facility-Administered Medications  Medication Dose Route Frequency Provider Last Rate Last Admin   nitroGLYCERIN  (NITROSTAT ) SL tablet 0.4 mg  0.4 mg Sublingual Once Blyth, Stacey A, MD        Allergies  Allergen Reactions   Benazepril  Anaphylaxis, Swelling and Hives    angioedema Throat and lip swelling   Ondansetron  Hcl Hives    Redness post IV  admin on 07/05/17   Codeine Nausea And Vomiting   Morphine Hives    Redness noted post IV admin on 07/05/17    Review of Systems:  neg     Physical Exam:    BP (!) 150/86 (BP Location: Left Arm, Patient Position: Sitting, Cuff Size: Large)   Pulse 84   Ht 5' 4 (1.626 m) Comment: weight measured without shoes  Wt 200 lb 4 oz (90.8 kg)   BMI 34.37 kg/m  Wt Readings from Last 3 Encounters:  10/11/24 200 lb 4 oz (90.8 kg)  10/03/24 195 lb 9.6 oz (88.7 kg)  09/22/24 195 lb (88.5 kg)   Constitutional:  Well-developed, in no acute distress. Psychiatric: Normal mood and affect. Behavior is normal. Abdominal: Soft, nondistended. Nontender. Bowel sounds active throughout. There are no masses palpable. No hepatomegaly.  Data Reviewed: I have personally reviewed following labs and imaging studies  CBC:    Latest Ref Rng & Units 10/03/2024    8:10 AM 08/25/2024    7:48 AM 07/27/2024    3:57 PM  CBC  WBC 4.0 - 10.5 K/uL 6.0  4.6  7.5   Hemoglobin 12.0 - 15.0 g/dL 87.0  88.0  86.7   Hematocrit 36.0 - 46.0 % 38.9  36.2  39.7   Platelets 150 - 400 K/uL 151  158  195     CMP:    Latest Ref Rng & Units 10/03/2024    8:10 AM 08/25/2024    7:48 AM 07/27/2024    3:57 PM  CMP  Glucose 70 - 99 mg/dL 884  887  823   BUN 8 - 23 mg/dL 11  15  17    Creatinine 0.44 - 1.00 mg/dL 9.46  9.35  9.28   Sodium 135 - 145 mmol/L 143  142  141   Potassium 3.5 - 5.1 mmol/L 3.8  4.0  3.9   Chloride 98 - 111 mmol/L 108  106  106   CO2 22 - 32 mmol/L 22  24  22    Calcium 8.9 - 10.3 mg/dL 9.2  9.1  9.5   Total Protein 6.5 - 8.1 g/dL 6.9  6.5  7.0   Total Bilirubin 0.0 - 1.2 mg/dL 0.5  0.5  0.4   Alkaline Phos 38 - 126 U/L 58  62  61   AST 15 - 41 U/L 27  25  29    ALT 0 - 44 U/L 32  24  38         Anselm Bring, MD 10/11/2024, 8:48 AM  Cc: Domenica Harlene LABOR, MD

## 2024-10-11 NOTE — Patient Instructions (Addendum)
 _______________________________________________________  If your blood pressure at your visit was 140/90 or greater, please contact your primary care physician to follow up on this.  _______________________________________________________  If you are age 70 or older, your body mass index should be between 23-30. Your Body mass index is 34.37 kg/m. If this is out of the aforementioned range listed, please consider follow up with your Primary Care Provider.  If you are age 8 or younger, your body mass index should be between 19-25. Your Body mass index is 34.37 kg/m. If this is out of the aformentioned range listed, please consider follow up with your Primary Care Provider.   ________________________________________________________  The [place] [facility] [facility] providers would like to encourage you to use MYCHART to communicate with providers for non-urgent requests or questions.  Due to long hold times on the telephone, sending your provider a message by [facility] may be a faster and more efficient way to get a response.  Please allow 48 business hours for a response.  Please remember that this is for non-urgent requests.  _______________________________________________________  [facility] is using a team-based approach to care.  Your team is made up of your doctor and two to three APPS. Our APPS (Nurse Practitioners and Physician Assistants) work with your physician to ensure care continuity for you. They are fully qualified to address your health concerns and develop a treatment plan. They communicate directly with your gastroenterologist to care for you. Seeing the Advanced Practice Practitioners on your physician's team can help you by facilitating care more promptly, often allowing for earlier appointments, access to diagnostic testing, procedures, and other specialty referrals.   We have sent the following medications to your pharmacy for you to pick up at your  convenience: Bentyl  Elavil   Please follow up in 6 months. Give us  a call at [phone] to schedule an appointment.  Thank you,  Dr. Lynnie Bring

## 2024-10-19 ENCOUNTER — Ambulatory Visit (INDEPENDENT_AMBULATORY_CARE_PROVIDER_SITE_OTHER): Admitting: Family Medicine

## 2024-10-19 ENCOUNTER — Ambulatory Visit: Payer: Self-pay | Admitting: Family Medicine

## 2024-10-19 ENCOUNTER — Telehealth: Payer: Self-pay

## 2024-10-19 ENCOUNTER — Encounter: Payer: Self-pay | Admitting: Family Medicine

## 2024-10-19 VITALS — BP 132/98 | HR 86 | Temp 97.7°F | Resp 16 | Ht 64.0 in | Wt 196.6 lb

## 2024-10-19 DIAGNOSIS — R10A3 Flank pain, bilateral: Secondary | ICD-10-CM

## 2024-10-19 DIAGNOSIS — R300 Dysuria: Secondary | ICD-10-CM

## 2024-10-19 DIAGNOSIS — R531 Weakness: Secondary | ICD-10-CM | POA: Diagnosis not present

## 2024-10-19 DIAGNOSIS — R197 Diarrhea, unspecified: Secondary | ICD-10-CM

## 2024-10-19 LAB — CBC WITH DIFFERENTIAL/PLATELET
Basophils Absolute: 0 K/uL (ref 0.0–0.1)
Basophils Relative: 0.8 % (ref 0.0–3.0)
Eosinophils Absolute: 0.1 K/uL (ref 0.0–0.7)
Eosinophils Relative: 1.7 % (ref 0.0–5.0)
HCT: 38.2 % (ref 36.0–46.0)
Hemoglobin: 12.6 g/dL (ref 12.0–15.0)
Lymphocytes Relative: 26.1 % (ref 12.0–46.0)
Lymphs Abs: 1.2 K/uL (ref 0.7–4.0)
MCHC: 33 g/dL (ref 30.0–36.0)
MCV: 91.3 fl (ref 78.0–100.0)
Monocytes Absolute: 0.4 K/uL (ref 0.1–1.0)
Monocytes Relative: 9.9 % (ref 3.0–12.0)
Neutro Abs: 2.8 K/uL (ref 1.4–7.7)
Neutrophils Relative %: 61.5 % (ref 43.0–77.0)
Platelets: 177 K/uL (ref 150.0–400.0)
RBC: 4.18 Mil/uL (ref 3.87–5.11)
RDW: 14 % (ref 11.5–15.5)
WBC: 4.5 K/uL (ref 4.0–10.5)

## 2024-10-19 LAB — COMPREHENSIVE METABOLIC PANEL WITH GFR
ALT: 31 U/L (ref 0–35)
AST: 24 U/L (ref 0–37)
Albumin: 4.2 g/dL (ref 3.5–5.2)
Alkaline Phosphatase: 51 U/L (ref 39–117)
BUN: 20 mg/dL (ref 6–23)
CO2: 28 meq/L (ref 19–32)
Calcium: 9.3 mg/dL (ref 8.4–10.5)
Chloride: 104 meq/L (ref 96–112)
Creatinine, Ser: 0.65 mg/dL (ref 0.40–1.20)
GFR: 89.22 mL/min (ref >60.00)
Glucose, Bld: 93 mg/dL (ref 70–99)
Potassium: 4.5 meq/L (ref 3.5–5.1)
Sodium: 139 meq/L (ref 135–145)
Total Bilirubin: 0.7 mg/dL (ref 0.2–1.2)
Total Protein: 6.4 g/dL (ref 6.0–8.3)

## 2024-10-19 LAB — POC URINALSYSI DIPSTICK (AUTOMATED)
Bilirubin, UA: NEGATIVE
Blood, UA: NEGATIVE
Glucose, UA: NEGATIVE
Ketones, UA: NEGATIVE
Leukocytes, UA: NEGATIVE
Nitrite, UA: NEGATIVE
Protein, UA: NEGATIVE
Spec Grav, UA: 1.015 (ref 1.010–1.025)
Urobilinogen, UA: 0.2 U/dL
pH, UA: 5 (ref 5.0–8.0)

## 2024-10-19 LAB — LIPASE: Lipase: 23 U/L (ref 11.0–59.0)

## 2024-10-19 LAB — MAGNESIUM: Magnesium: 1.8 mg/dL (ref 1.5–2.5)

## 2024-10-19 LAB — AMYLASE: Amylase: 64 U/L (ref 27–131)

## 2024-10-19 MED ORDER — CEPHALEXIN 500 MG PO CAPS: 500.0000 mg | ORAL_CAPSULE | Freq: Two times a day (BID) | ORAL | 0 refills | Status: AC

## 2024-10-19 NOTE — Telephone Encounter (Signed)
 Oral Oncology Patient Advocate Encounter   Received notification that prior authorization for Xifaxin is required.   PA submitted on 10/19/2024 Key [id number] Status is pending      Charlott Hamilton,  CPhT-Adv  she/her/hers [facility]  [facility] Pharmacy Technician Patient Advocate Specialist III [facility] Phone: [phone]  Fax: [phone] [email or web address]

## 2024-10-19 NOTE — Progress Notes (Signed)
 Subjective:    [name], female    DOB: 1956/06/22, 68 y.o.   MRN: [id number]  Chief Complaint  Patient presents with   Back Pain    X1 week, lower back pain, states not making much water. Feels uncomfortable to pee    HPI Patient is in today for back pain and dysuria.  Discussed the use of AI scribe software for clinical note transcription with the patient, who gave verbal consent to proceed.  History of Present Illness Tami [name] is a 68 year old female with multiple myeloma who presents with urinary symptoms and diarrhea.  She experiences dysuria and a sensation of fever, although she has not measured her temperature. There is no hematuria or discharge in her urine. The urinary symptoms are accompanied by lower back pain, located in the lower back and middle area.  She has been experiencing vomiting, particularly postprandial, and describes a general feeling of weakness and fatigue. She has not received treatment for her multiple myeloma in over a year.  She has had diarrhea for over a week, with three to four stools per day. She consumes electrolytes like Gatorade to manage potential dehydration. There have been no recent changes in medication or antibiotic use. She takes cholestyramine  and other medications prescribed by her gastroenterologist and oncologist, but notes that the diarrhea started after her last visit to the gastroenterologist.  She takes potassium supplements at a dose of 20 mcg.    Past Medical History:  Diagnosis Date   Abnormal SPEP 07/26/2016   Arthritis    knees, hands   Back pain 07/14/2016   Bowel obstruction (HCC) 11/2014   C. 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   Multiple myeloma (HCC) 07/20/2018   Multiple myeloma not having achieved remission (HCC) 07/20/2018   Myalgia 12/31/2016   Obstructive sleep apnea 03/31/2015   Presence of permanent cardiac pacemaker    SOB (shortness of breath) 04/09/2016   Stroke (HCC)    TIAs   TIA (transient ischemic attack)    UTI (urinary tract infection)     Past Surgical History:  Procedure Laterality Date   ABDOMINAL HYSTERECTOMY     menorraghia, 2006, total   BIOPSY  09/01/2022   Procedure: BIOPSY;  Surgeon: Charlanne Groom, MD;  Location: [facility];  Service: Gastroenterology;;   CHOLECYSTECTOMY     COLONOSCOPY  2018   [facility] per patient   COLONOSCOPY WITH PROPOFOL  N/A 09/01/2022   Procedure: COLONOSCOPY WITH PROPOFOL ;  Surgeon: Charlanne Groom, MD;  Location: [facility];  Service: Gastroenterology;  Laterality: N/A;   ESOPHAGOGASTRODUODENOSCOPY  2018   [facility]    ESOPHAGOGASTRODUODENOSCOPY N/A 07/03/2024   Procedure: EGD (ESOPHAGOGASTRODUODENOSCOPY);  Surgeon: San Sandor GAILS, DO;  Location: [facility];  Service: Gastroenterology;  Laterality: N/A;   ESOPHAGOGASTRODUODENOSCOPY (EGD) WITH PROPOFOL  N/A 09/01/2022   Procedure: ESOPHAGOGASTRODUODENOSCOPY (EGD) WITH PROPOFOL ;  Surgeon: Charlanne Groom, MD;  Location: [facility];  Service: Gastroenterology;  Laterality: N/A;  IR IMAGING GUIDED PORT INSERTION  06/29/2023   KNEE SURGERY     right, repair torn torn cartialage   PACEMAKER INSERTION  08/2014   changed last in 2023 per pt at [facility]   TONSILLECTOMY     TUBAL LIGATION      Family History  Problem Relation Age of Onset   Diabetes Mother    Hypertension Mother    Heart disease Mother        s/p 1 stent   Hyperlipidemia Mother    Arthritis Mother     Colon cancer Father 40   Irritable bowel syndrome Sister    Hypertension Maternal Grandmother    Arthritis Maternal Grandmother    Heart disease Maternal Grandfather        MI   Hypertension Maternal Grandfather    Arthritis Maternal Grandfather    Hyperlipidemia Daughter    Hypertension Daughter    Hypertension Son    Esophageal cancer Neg Hx    Uterine cancer Neg Hx    Pancreatic cancer Neg Hx     Social History   Socioeconomic History   Marital status: Single    Spouse name: [name]   Number of children: 3   Years of education: [name]   Highest education level: High school graduate  Occupational History   [name]  Tobacco Use   Smoking status: Never    Passive exposure: Never   Smokeless tobacco: Never  Vaping Use   Vaping status: Never Used  Substance and Sexual Activity   Alcohol use: No   Drug use: No   Sexual activity: Not Currently    Partners: Male    Birth control/protection: Post-menopausal  Other Topics Concern   [name]  Social History Narrative   Lives at home alone   Retired   Caffeine: coffee   Social Drivers of Health   Tobacco Use: Low Risk (10/19/2024)   Patient History    Smoking Tobacco Use: Never    Smokeless Tobacco Use: Never    Passive Exposure: Never  Financial Resource Strain: Low Risk (07/25/2024)   Overall Financial Resource Strain (CARDIA)    Difficulty of Paying Living Expenses: Not hard at all  Food Insecurity: No Food Insecurity (07/25/2024)   Epic    Worried About Programme Researcher, Broadcasting/film/video in the Last Year: Never true    Ran Out of Food in the Last Year: Never true  Transportation Needs: No Transportation Needs (07/25/2024)   Epic    Lack of Transportation (Medical): No    Lack of Transportation (Non-Medical): No  Physical Activity: Sufficiently Active (07/25/2024)   Exercise Vital Sign    Days of Exercise per Week: 3 days    Minutes of Exercise per Session: 60 min  Stress: No Stress Concern Present  (07/25/2024)   [employer] of Occupational Health - Occupational Stress Questionnaire    Feeling of Stress: Not at all  Social Connections: Moderately Integrated (07/25/2024)   Social Connection and Isolation Panel    Frequency of Communication with Friends and Family: More than three times a week    Frequency of Social Gatherings with Friends and Family: More than three times a week    Attends Religious Services: More than 4 times per year    Active Member of [employer] or Organizations: Yes    Attends Engineer, Structural: More than 4 times per year    Marital Status: Divorced  Catering Manager Violence: Not At Risk (  07/25/2024)   Epic    Fear of Current or Ex-Partner: No    Emotionally Abused: No    Physically Abused: No    Sexually Abused: No  Depression (PHQ2-9): Low Risk (10/03/2024)   Depression (PHQ2-9)    PHQ-2 Score: 0  Recent Concern: Depression (PHQ2-9) - Medium Risk (07/25/2024)   Depression (PHQ2-9)    PHQ-2 Score: 8  Alcohol Screen: Low Risk (07/25/2024)   Alcohol Screen    Last Alcohol Screening Score (AUDIT): 0  Housing: Unknown (07/25/2024)   Epic    Unable to Pay for Housing in the Last Year: No    Number of Times Moved in the Last Year: [name]    Homeless in the Last Year: No  Utilities: Not At Risk (07/25/2024)   Epic    Threatened with loss of utilities: No  Health Literacy: Adequate Health Literacy (07/25/2024)   B1300 Health Literacy    Frequency of need for help with medical instructions: Never    Outpatient Medications Prior to Visit  Medication Sig Dispense Refill   acetaminophen  (TYLENOL ) 500 MG tablet Take 500 mg by mouth every 6 (six) hours as needed.     acyclovir  (ZOVIRAX ) 400 MG tablet Take 1 tablet (400 mg total) by mouth 2 (two) times daily. 60 tablet 6   albuterol  (PROVENTIL ) (2.5 MG/3ML) 0.083% nebulizer solution Take 3 mLs (2.5 mg total) by nebulization every 6 (six) hours as needed for wheezing or shortness of breath. 150 mL 2    albuterol  (VENTOLIN  HFA) 108 (90 Base) MCG/ACT inhaler Inhale 2 puffs into the lungs every 6 (six) hours as needed for wheezing or shortness of breath. 18 g 5   amitriptyline  (ELAVIL ) 25 MG tablet Take 1 tablet (25 mg total) by mouth at bedtime. 30 tablet 6   amLODipine  (NORVASC ) 5 MG tablet Take 5 mg by mouth daily.     aspirin  EC 81 MG tablet Take 81 mg by mouth daily. Swallow whole.     budesonide  (PULMICORT ) 0.5 MG/2ML nebulizer solution Take 2 mLs (0.5 mg total) by nebulization 2 (two) times daily as needed (cough, wheezing or shortness of breath). 120 mL 11   cetirizine  (ZYRTEC ) 10 MG tablet Take 10 mg by mouth at bedtime.     Cholecalciferol (VITAMIN D3) 50 MCG (2000 UT) TABS Take 2,000 Units by mouth in the morning.     cholestyramine  (QUESTRAN ) 4 g packet Take 1 packet by mouth daily. Separate from other medications by at least 2 hours. Start at daily and can increase to twice daily if no improvement after 7 days. 60 each 11   cholestyramine  light (PREVALITE ) 4 g packet Take 4 g by mouth 2 (two) times daily.     cyclobenzaprine  (FLEXERIL ) 5 MG tablet Take 1 tablet (5 mg total) by mouth 2 (two) times daily as needed for up to 10 doses for muscle spasms. 10 tablet 0   dicyclomine  (BENTYL ) 20 MG tablet Take 1 tablet (20 mg total) by mouth 3 (three) times daily as needed for spasms. 90 tablet 4   diphenhydrAMINE  (BENADRYL ) 25 MG tablet Take 25 mg by mouth.     diphenoxylate -atropine  (LOMOTIL ) 2.5-0.025 MG tablet Take 1 tablet by mouth 2 (two) times daily. 90 tablet 4   estradiol  (ESTRACE ) 0.1 MG/GM vaginal cream Place 0.5 g vaginally 2 (two) times a week. Place 0.5g nightly for two weeks then twice a week after 30 g 3   famotidine  (PEPCID ) 20 MG tablet Take 1 tablet (20  mg total) by mouth at bedtime. 30 tablet 2   ferrous sulfate (FEROSUL) 325 (65 FE) MG tablet Take 1 tablet (325 mg total) by mouth daily with breakfast. 90 tablet 0   fluticasone  (FLONASE ) 50 MCG/ACT nasal spray Place 2 sprays  into both nostrils daily. 16 g 0   furosemide  (LASIX ) 20 MG tablet Take 1 tablet (20 mg total) by mouth daily as needed (fluid retention (feet swelling)). 30 tablet 1   hydrOXYzine  (ATARAX ) 10 MG tablet TAKE ONE (1) TABLET BY MOUTH 3 TIMES DAILY AS NEEDED FOR ITCHING 30 tablet 2   hyoscyamine  (LEVSIN  SL) 0.125 MG SL tablet Place 1 tablet (0.125 mg total) under the tongue every 6 (six) hours as needed. 30 tablet 2   lidocaine  (XYLOCAINE ) 5 % ointment Apply 1 Application topically as needed. 35.44 g 0   lidocaine -prilocaine  (EMLA ) cream Apply 1 Application topically as needed. 30 g 0   loperamide  (IMODIUM ) 2 MG capsule Take 4 mg by mouth in the morning and at bedtime.     losartan  (COZAAR ) 50 MG tablet Take 50 mg by mouth in the morning and at bedtime.     Magnesium  Glycinate 100 MG CAPS Take 200 mg by mouth at bedtime.     meclizine  (ANTIVERT ) 12.5 MG tablet Take 12.5 mg by mouth 2 (two) times daily as needed for dizziness or nausea.     metoprolol  succinate (TOPROL -XL) 50 MG 24 hr tablet Take 1 tablet (50 mg total) by mouth 2 (two) times daily. 60 tablet 2   montelukast  (SINGULAIR ) 10 MG tablet Take 1 tablet (10 mg total) by mouth at bedtime. Please start taking this on September 4. 30 tablet 3   Multiple Vitamins-Minerals (CENTRUM SILVER  WOMEN 50+ PO) Take 1 tablet by mouth daily at 6 (six) AM.     neomycin -polymyxin-hydrocortisone (CORTISPORIN) OTIC solution Place 4 drops into both ears 4 (four) times daily. 10 mL 4   oxyCODONE -acetaminophen  (PERCOCET) 5-325 MG tablet Take 2 tablets by mouth every 6 (six) hours as needed. 10 tablet 0   pantoprazole  (PROTONIX ) 40 MG tablet Take 1 tablet (40 mg total) by mouth daily. 30 tablet 3   potassium chloride  SA (KLOR-CON  M) 20 MEQ tablet Take 1 tablet (20 mEq total) by mouth daily. 30 tablet 5   prochlorperazine  (COMPAZINE ) 10 MG tablet Take 0.5 tablets (5 mg total) by mouth 2 (two) times daily as needed for nausea or vomiting. 60 tablet 1   Respiratory  Therapy Supplies (PILLOW MASK/ADULT) MISC      rifaximin  (XIFAXAN ) 200 MG tablet Take 1 tablet (200 mg total) by mouth 2 (two) times daily. 60 tablet 4   scopolamine  (TRANSDERM-SCOP) 1 MG/3DAYS Place 1 patch (1.5 mg total) onto the skin every 3 (three) days. 10 patch 1   spironolactone  (ALDACTONE ) 25 MG tablet Take 25 mg by mouth daily.     sucralfate  (CARAFATE ) 1 GM/10ML suspension Take 10 mLs (1 g total) by mouth 4 (four) times daily -  with meals and at bedtime. 473 mL 1   terbinafine (LAMISIL) 250 MG tablet Take 250 mg by mouth daily.     triamcinolone  cream (KENALOG ) 0.5 % Apply topically.     Vibegron  (GEMTESA ) 75 MG TABS Take 1 tablet (75 mg total) by mouth daily. 30 tablet 2   Facility-Administered Medications Prior to Visit  Medication Dose Route Frequency Provider Last Rate Last Admin   nitroGLYCERIN  (NITROSTAT ) SL tablet 0.4 mg  0.4 mg Sublingual Once Blyth, Stacey A, MD  Allergies[1]  Review of Systems  Constitutional:  Negative for fever and malaise/fatigue.  HENT:  Negative for congestion.   Eyes:  Negative for blurred vision.  Respiratory:  Negative for shortness of breath.   Cardiovascular:  Negative for chest pain, palpitations and leg swelling.  Gastrointestinal:  Negative for abdominal pain, blood in stool and nausea.  Genitourinary:  Positive for dysuria and flank pain. Negative for frequency.  Musculoskeletal:  Negative for falls.  Skin:  Negative for rash.  Neurological:  Negative for dizziness, loss of consciousness and headaches.  Endo/Heme/Allergies:  Negative for environmental allergies.  Psychiatric/Behavioral:  Negative for depression. The patient is not nervous/anxious.        Objective:    Physical Exam Vitals and nursing note reviewed.  Constitutional:      General: She is not in acute distress.    Appearance: Normal appearance. She is well-developed.  HENT:     Head: Normocephalic and atraumatic.  Eyes:     General: No scleral icterus.        Right eye: No discharge.        Left eye: No discharge.  Cardiovascular:     Rate and Rhythm: Normal rate and regular rhythm.     Heart sounds: No murmur heard. Pulmonary:     Effort: Pulmonary effort is normal. No respiratory distress.     Breath sounds: Normal breath sounds.  Abdominal:     General: There is no distension.     Tenderness: There is no abdominal tenderness. There is no guarding or rebound.  Musculoskeletal:        General: Normal range of motion.     Cervical back: Normal range of motion and neck supple.     Right lower leg: No edema.     Left lower leg: No edema.  Skin:    General: Skin is warm and dry.  Neurological:     Mental Status: She is alert and oriented to person, place, and time.  Psychiatric:        Mood and Affect: Mood normal.        Behavior: Behavior normal.        Thought Content: Thought content normal.        Judgment: Judgment normal.     BP (!) 132/98 (BP Location: Left Arm, Patient Position: Sitting, Cuff Size: Large)   Pulse 86   Temp 97.7 F (36.5 C) (Oral)   Resp 16   Ht 5' 4 (1.626 m)   Wt 196 lb 9.6 oz (89.2 kg)   SpO2 99%   BMI 33.75 kg/m  Wt Readings from Last 3 Encounters:  10/19/24 196 lb 9.6 oz (89.2 kg)  10/11/24 200 lb 4 oz (90.8 kg)  10/03/24 195 lb 9.6 oz (88.7 kg)    Diabetic Foot Exam - Simple   No data filed    Lab Results  Component Value Date   WBC 6.0 10/03/2024   HGB 12.9 10/03/2024   HCT 38.9 10/03/2024   PLT 151 10/03/2024   GLUCOSE 115 (H) 10/03/2024   CHOL 195 05/18/2024   TRIG 92.0 05/18/2024   HDL 55.70 05/18/2024   LDLCALC 121 (H) 05/18/2024   ALT 32 10/03/2024   AST 27 10/03/2024   NA 143 10/03/2024   K 3.8 10/03/2024   CL 108 10/03/2024   CREATININE 0.53 10/03/2024   BUN 11 10/03/2024   CO2 22 10/03/2024   TSH 1.45 05/18/2024   INR 0.90 07/28/2018   HGBA1C 5.7 05/18/2024  Lab Results  Component Value Date   TSH 1.45 05/18/2024   Lab Results  Component Value Date    WBC 6.0 10/03/2024   HGB 12.9 10/03/2024   HCT 38.9 10/03/2024   MCV 89.8 10/03/2024   PLT 151 10/03/2024   Lab Results  Component Value Date   NA 143 10/03/2024   K 3.8 10/03/2024   CHLORIDE 108 12/31/2016   CO2 22 10/03/2024   GLUCOSE 115 (H) 10/03/2024   BUN 11 10/03/2024   CREATININE 0.53 10/03/2024   BILITOT 0.5 10/03/2024   ALKPHOS 58 10/03/2024   AST 27 10/03/2024   ALT 32 10/03/2024   PROT 6.9 10/03/2024   ALBUMIN 4.3 10/03/2024   CALCIUM 9.2 10/03/2024   ANIONGAP 12 10/03/2024   EGFR 96 03/15/2024   GFR 80.92 05/18/2024   Lab Results  Component Value Date   CHOL 195 05/18/2024   Lab Results  Component Value Date   HDL 55.70 05/18/2024   Lab Results  Component Value Date   LDLCALC 121 (H) 05/18/2024   Lab Results  Component Value Date   TRIG 92.0 05/18/2024   Lab Results  Component Value Date   CHOLHDL 3 05/18/2024   Lab Results  Component Value Date   HGBA1C 5.7 05/18/2024       Assessment & Plan:  Dysuria -     POCT Urinalysis Dipstick (Automated) -     Urine Culture -     Cephalexin ; Take 1 capsule (500 mg total) by mouth 2 (two) times daily.  Dispense: 14 capsule; Refill: 0  Bilateral flank pain -     CBC with Differential/Platelet -     Comprehensive metabolic panel with GFR -     Lipase -     Amylase  Weakness -     Magnesium   Diarrhea, unspecified type -     Lipase -     Amylase -     Stool culture -     Ova and parasite examination -     C. difficile GDH and Toxin A/B   Assessment and Plan Assessment & Plan Urinary tract infection   Suspected urinary tract infection with dysuria and lower back pain. Urine dipstick test negative, but culture pending. Treat empirically due to significant pain. Ordered blood work to assess kidney function.  Chronic diarrhea   Recent exacerbation with 3-4 stools per day. No recent antibiotic use or dietary changes. Currently on cholestyramine  and other medications prescribed by  gastroenterologist. Diarrhea may contribute to electrolyte imbalances. Ordered blood work to check electrolytes, including potassium and magnesium . Provided containers for stool sample collection if diarrhea is watery. Advised to continue current medications.  Weakness   Reports weakness and fatigue, possibly related to electrolyte imbalances from chronic diarrhea. Potassium supplementation at home. No recent changes in medication regimen. Checked potassium and magnesium  levels. Advised to continue potassium supplementation.   Tami JONELLE Antonio Cyndee, DO     [1]  Allergies Allergen Reactions   Benazepril  Anaphylaxis, Swelling and Hives    angioedema Throat and lip swelling   Ondansetron  Hcl Hives    Redness post IV admin on 07/05/17   Codeine Nausea And Vomiting   Morphine Hives    Redness noted post IV admin on 07/05/17

## 2024-10-20 NOTE — Telephone Encounter (Signed)
 Oral Oncology Patient Advocate Encounter  Prior Authorization for Xifaxin has been approved.    PA# [id number] Effective dates: 10/19/2024 through 11/03/2025   Patient has been notified via MyChart  Charlott Hamilton,  CPhT-Adv  she/her/hers [facility]  [facility] Pharmacy Technician Patient Advocate Specialist III [facility] Phone: [phone]  Fax: [phone] [email or web address]

## 2024-10-22 LAB — URINE CULTURE
MICRO NUMBER:: 17343646
SPECIMEN QUALITY:: ADEQUATE

## 2024-10-27 ENCOUNTER — Encounter: Payer: Self-pay | Admitting: Hematology & Oncology

## 2024-10-31 ENCOUNTER — Inpatient Hospital Stay

## 2024-10-31 ENCOUNTER — Inpatient Hospital Stay: Attending: Hematology & Oncology

## 2024-10-31 ENCOUNTER — Other Ambulatory Visit: Payer: Self-pay | Admitting: *Deleted

## 2024-10-31 ENCOUNTER — Other Ambulatory Visit

## 2024-10-31 VITALS — BP 115/77 | HR 80 | Temp 97.9°F | Resp 18

## 2024-10-31 DIAGNOSIS — R002 Palpitations: Secondary | ICD-10-CM

## 2024-10-31 DIAGNOSIS — C9000 Multiple myeloma not having achieved remission: Secondary | ICD-10-CM | POA: Diagnosis present

## 2024-10-31 DIAGNOSIS — E612 Magnesium deficiency: Secondary | ICD-10-CM

## 2024-10-31 DIAGNOSIS — D500 Iron deficiency anemia secondary to blood loss (chronic): Secondary | ICD-10-CM

## 2024-10-31 DIAGNOSIS — D518 Other vitamin B12 deficiency anemias: Secondary | ICD-10-CM

## 2024-10-31 DIAGNOSIS — E860 Dehydration: Secondary | ICD-10-CM

## 2024-10-31 DIAGNOSIS — C9002 Multiple myeloma in relapse: Secondary | ICD-10-CM

## 2024-10-31 DIAGNOSIS — R197 Diarrhea, unspecified: Secondary | ICD-10-CM

## 2024-10-31 DIAGNOSIS — I5041 Acute combined systolic (congestive) and diastolic (congestive) heart failure: Secondary | ICD-10-CM

## 2024-10-31 DIAGNOSIS — D801 Nonfamilial hypogammaglobulinemia: Secondary | ICD-10-CM

## 2024-10-31 DIAGNOSIS — A0472 Enterocolitis due to Clostridium difficile, not specified as recurrent: Secondary | ICD-10-CM

## 2024-10-31 DIAGNOSIS — K219 Gastro-esophageal reflux disease without esophagitis: Secondary | ICD-10-CM

## 2024-10-31 LAB — CMP (CANCER CENTER ONLY)
ALT: 37 U/L (ref 0–44)
AST: 27 U/L (ref 15–41)
Albumin: 4.2 g/dL (ref 3.5–5.0)
Alkaline Phosphatase: 58 U/L (ref 38–126)
Anion gap: 10 (ref 5–15)
BUN: 20 mg/dL (ref 8–23)
CO2: 26 mmol/L (ref 22–32)
Calcium: 9.4 mg/dL (ref 8.9–10.3)
Chloride: 107 mmol/L (ref 98–111)
Creatinine: 0.66 mg/dL (ref 0.44–1.00)
GFR, Estimated: >60 mL/min
Glucose, Bld: 107 mg/dL — ABNORMAL HIGH (ref 70–99)
Potassium: 4.3 mmol/L (ref 3.5–5.1)
Sodium: 143 mmol/L (ref 135–145)
Total Bilirubin: 0.6 mg/dL (ref 0.0–1.2)
Total Protein: 6.7 g/dL (ref 6.5–8.1)

## 2024-10-31 LAB — CBC WITH DIFFERENTIAL (CANCER CENTER ONLY)
Abs Immature Granulocytes: 0.04 K/uL (ref 0.00–0.07)
Basophils Absolute: 0.1 K/uL (ref 0.0–0.1)
Basophils Relative: 1 %
Eosinophils Absolute: 0.1 K/uL (ref 0.0–0.5)
Eosinophils Relative: 2 %
HCT: 37.6 % (ref 36.0–46.0)
Hemoglobin: 12.2 g/dL (ref 12.0–15.0)
Immature Granulocytes: 1 %
Lymphocytes Relative: 27 %
Lymphs Abs: 1.5 K/uL (ref 0.7–4.0)
MCH: 29.7 pg (ref 26.0–34.0)
MCHC: 32.4 g/dL (ref 30.0–36.0)
MCV: 91.5 fL (ref 80.0–100.0)
Monocytes Absolute: 0.5 K/uL (ref 0.1–1.0)
Monocytes Relative: 9 %
Neutro Abs: 3.2 K/uL (ref 1.7–7.7)
Neutrophils Relative %: 60 %
Platelet Count: 155 K/uL (ref 150–400)
RBC: 4.11 MIL/uL (ref 3.87–5.11)
RDW: 12.7 % (ref 11.5–15.5)
WBC Count: 5.4 K/uL (ref 4.0–10.5)
nRBC: 0 % (ref 0.0–0.2)

## 2024-10-31 MED ORDER — SODIUM CHLORIDE 0.9 % IV SOLN: INTRAVENOUS | Status: DC

## 2024-10-31 NOTE — Patient Instructions (Signed)

## 2024-11-03 ENCOUNTER — Emergency Department (HOSPITAL_BASED_OUTPATIENT_CLINIC_OR_DEPARTMENT_OTHER)
Admission: EM | Admit: 2024-11-03 | Discharge: 2024-11-03 | Disposition: A | Discharge status: Home / Self Care | Attending: Emergency Medicine | Admitting: Emergency Medicine

## 2024-11-03 ENCOUNTER — Encounter (HOSPITAL_BASED_OUTPATIENT_CLINIC_OR_DEPARTMENT_OTHER): Payer: Self-pay

## 2024-11-03 ENCOUNTER — Other Ambulatory Visit: Payer: Self-pay

## 2024-11-03 ENCOUNTER — Emergency Department (HOSPITAL_BASED_OUTPATIENT_CLINIC_OR_DEPARTMENT_OTHER)

## 2024-11-03 ENCOUNTER — Other Ambulatory Visit (HOSPITAL_BASED_OUTPATIENT_CLINIC_OR_DEPARTMENT_OTHER): Payer: Self-pay

## 2024-11-03 DIAGNOSIS — R197 Diarrhea, unspecified: Secondary | ICD-10-CM | POA: Diagnosis not present

## 2024-11-03 DIAGNOSIS — I110 Hypertensive heart disease with heart failure: Secondary | ICD-10-CM | POA: Insufficient documentation

## 2024-11-03 DIAGNOSIS — I509 Heart failure, unspecified: Secondary | ICD-10-CM | POA: Insufficient documentation

## 2024-11-03 DIAGNOSIS — Z79899 Other long term (current) drug therapy: Secondary | ICD-10-CM | POA: Diagnosis not present

## 2024-11-03 DIAGNOSIS — M545 Low back pain, unspecified: Secondary | ICD-10-CM | POA: Insufficient documentation

## 2024-11-03 DIAGNOSIS — Z7982 Long term (current) use of aspirin: Secondary | ICD-10-CM | POA: Insufficient documentation

## 2024-11-03 DIAGNOSIS — Z8579 Personal history of other malignant neoplasms of lymphoid, hematopoietic and related tissues: Secondary | ICD-10-CM | POA: Insufficient documentation

## 2024-11-03 LAB — C. DIFFICILE GDH AND TOXIN A/B
GDH ANTIGEN: DETECTED
MICRO NUMBER:: 17392039
SPECIMEN QUALITY:: ADEQUATE
TOXIN A AND B: NOT DETECTED

## 2024-11-03 LAB — COMPREHENSIVE METABOLIC PANEL WITH GFR
ALT: 38 U/L (ref 0–44)
AST: 28 U/L (ref 15–41)
Albumin: 4.3 g/dL (ref 3.5–5.0)
Alkaline Phosphatase: 57 U/L (ref 38–126)
Anion gap: 13 (ref 5–15)
BUN: 17 mg/dL (ref 8–23)
CO2: 23 mmol/L (ref 22–32)
Calcium: 9.7 mg/dL (ref 8.9–10.3)
Chloride: 106 mmol/L (ref 98–111)
Creatinine, Ser: 0.66 mg/dL (ref 0.44–1.00)
GFR, Estimated: >60 mL/min
Glucose, Bld: 107 mg/dL — ABNORMAL HIGH (ref 70–99)
Potassium: 3.9 mmol/L (ref 3.5–5.1)
Sodium: 142 mmol/L (ref 135–145)
Total Bilirubin: 0.5 mg/dL (ref 0.0–1.2)
Total Protein: 6.9 g/dL (ref 6.5–8.1)

## 2024-11-03 LAB — URINALYSIS, W/ REFLEX TO CULTURE (INFECTION SUSPECTED)
Bilirubin Urine: NEGATIVE
Glucose, UA: NEGATIVE mg/dL
Hgb urine dipstick: NEGATIVE
Ketones, ur: NEGATIVE mg/dL
Nitrite: NEGATIVE
Protein, ur: NEGATIVE mg/dL
Specific Gravity, Urine: 1.025 (ref 1.005–1.030)
pH: 5 (ref 5.0–8.0)

## 2024-11-03 LAB — CBC WITH DIFFERENTIAL/PLATELET
Abs Immature Granulocytes: 0.03 K/uL (ref 0.00–0.07)
Basophils Absolute: 0 K/uL (ref 0.0–0.1)
Basophils Relative: 1 %
Eosinophils Absolute: 0.1 K/uL (ref 0.0–0.5)
Eosinophils Relative: 1 %
HCT: 39.6 % (ref 36.0–46.0)
Hemoglobin: 12.9 g/dL (ref 12.0–15.0)
Immature Granulocytes: 1 %
Lymphocytes Relative: 30 %
Lymphs Abs: 1.5 K/uL (ref 0.7–4.0)
MCH: 29.7 pg (ref 26.0–34.0)
MCHC: 32.6 g/dL (ref 30.0–36.0)
MCV: 91 fL (ref 80.0–100.0)
Monocytes Absolute: 0.4 K/uL (ref 0.1–1.0)
Monocytes Relative: 8 %
Neutro Abs: 3 K/uL (ref 1.7–7.7)
Neutrophils Relative %: 59 %
Platelets: 153 K/uL (ref 150–400)
RBC: 4.35 MIL/uL (ref 3.87–5.11)
RDW: 12.5 % (ref 11.5–15.5)
WBC: 5.1 K/uL (ref 4.0–10.5)
nRBC: 0 % (ref 0.0–0.2)

## 2024-11-03 LAB — OVA AND PARASITE EXAMINATION

## 2024-11-03 LAB — RESP PANEL BY RT-PCR (RSV, FLU A&B, COVID)  RVPGX2
Influenza A by PCR: NEGATIVE
Influenza B by PCR: NEGATIVE
Resp Syncytial Virus by PCR: NEGATIVE
SARS Coronavirus 2 by RT PCR: NEGATIVE

## 2024-11-03 LAB — CLOSTRIDIUM DIFFICILE TOXIN B, QUALITATIVE, REAL-TIME PCR: Toxigenic C. Difficile by PCR: NOT DETECTED

## 2024-11-03 MED ORDER — SODIUM CHLORIDE 0.9 % IV BOLUS: 1000.0000 mL | Freq: Once | INTRAVENOUS | Status: DC

## 2024-11-03 MED ORDER — METHOCARBAMOL 500 MG PO TABS
500.0000 mg | ORAL_TABLET | Freq: Three times a day (TID) | ORAL | 0 refills | Status: AC | PRN
  Filled 2024-11-03: qty 20, 7d supply, fill #0

## 2024-11-03 MED ORDER — LACTATED RINGERS IV BOLUS
1000.0000 mL | Freq: Once | INTRAVENOUS | Status: AC
  Administered 2024-11-03: 1000 mL via INTRAVENOUS

## 2024-11-03 MED ORDER — PROCHLORPERAZINE MALEATE 10 MG PO TABS
10.0000 mg | ORAL_TABLET | Freq: Two times a day (BID) | ORAL | 0 refills | Status: AC | PRN
  Filled 2024-11-03: qty 10, 5d supply, fill #0

## 2024-11-03 NOTE — ED Notes (Signed)
 RESP SWAB IN LAB

## 2024-11-03 NOTE — ED Triage Notes (Signed)
 Patient reports chronic back pain that isn't controlled with her normal medication and reports diarrhea that started last week.  Patient reports feeling weak.  NAD noted in triage. Ambulates with steady gait.

## 2024-11-03 NOTE — ED Provider Notes (Signed)
 "  EMERGENCY DEPARTMENT AT [facility] Provider Note   CSN: [id number] Arrival date & time: 11/03/24  1315     Patient presents with: Back Pain, Diarrhea, and Weakness   Tami [name] is a 68 y.o. female.   HPI     68 yo female with history of multiple myeloma in remission, IBS-D, previous history of CDiff, CVA, CHF who presents with diarrhea and back pain.    Diarrhea Saw Dr. Timmy After that visit started to have diarrhea again, after Tuesday 4 times a day with diarrhea, no black or blood stools No nausea or vomiting No abdominal pain now, now just diarrhea Low back pain since last week, just lower part  Not sure if UTI, back pain sometimes indicates that Been feverish, feeling that way on the inside, has not measured it Used to have diarrhea and it got better, but then came back again  Dr. Charlanne Gastroenterologist, Dr. Luiz ID Urinating less No falls or trauma No numbness/weakness in legs, no loss control of bowel or bladder   Taking xifaxin, lomotil , bentyl , amitryptiline, cholestyramine   2 weeks ago got off antifungal, no recent antibiotics   C Diff tested 12/23 negative Other stool studies negative salmonella, campylobacter, e coli  Past Medical History:  Diagnosis Date   Abnormal SPEP 07/26/2016   Arthritis    knees, hands   Back pain 07/14/2016   Bowel obstruction (HCC) 11/2014   C. 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 Authorizing Provider  methocarbamol  (ROBAXIN ) 500 MG tablet Take 1 tablet (500 mg total) by mouth every 8 (eight) hours as needed for muscle spasms. 11/03/24  Yes Dreama Longs, MD  prochlorperazine  (COMPAZINE ) 10 MG tablet Take 1 tablet (10 mg total) by mouth 2 (two) times daily as needed for nausea or vomiting. 11/03/24  Yes Dreama Longs, MD  acetaminophen  (TYLENOL ) 500 MG tablet Take 500 mg by mouth every 6 (six) hours as needed.    [provider]  acyclovir  (ZOVIRAX ) 400 MG tablet Take 1 tablet (400 mg total) by mouth 2 (two) times daily. 10/03/24   Timmy Maude SAUNDERS, MD  albuterol  (PROVENTIL ) (2.5 MG/3ML) 0.083% nebulizer solution Take 3 mLs (2.5 mg total) by nebulization every 6 (six) hours as needed for wheezing or shortness of breath. 12/14/23   Kara Dorn NOVAK, MD  albuterol  (VENTOLIN  HFA) 108 [phone] Base) MCG/ACT inhaler Inhale 2 puffs into the lungs every 6 (six) hours as needed for wheezing or shortness of breath. 12/14/23   Kara Dorn NOVAK, MD  amitriptyline  (  ELAVIL ) 25 MG tablet Take 1 tablet (25 mg total) by mouth at bedtime. 10/11/24   Charlanne Groom, MD  amLODipine  (NORVASC ) 5 MG tablet Take 5 mg by mouth daily. 09/14/23   [provider]  aspirin  EC 81 MG tablet Take 81 mg by mouth daily. Swallow whole.    [provider]  budesonide  (PULMICORT ) 0.5 MG/2ML nebulizer solution Take 2 mLs (0.5 mg total) by nebulization 2 (two) times daily as needed (cough, wheezing or shortness of breath). 12/14/23   Kara Dorn NOVAK, MD  cephALEXin  (KEFLEX ) 500 MG capsule Take 1 capsule (500 mg total) by mouth 2  (two) times daily. 10/19/24   Antonio Cyndee Jamee JONELLE, DO  cetirizine  (ZYRTEC ) 10 MG tablet Take 10 mg by mouth at bedtime.    [provider]  Cholecalciferol (VITAMIN D3) 50 MCG (2000 UT) TABS Take 2,000 Units by mouth in the morning.    [provider]  cholestyramine  (QUESTRAN ) 4 g packet Take 1 packet by mouth daily. Separate from other medications by at least 2 hours. Start at daily and can increase to twice daily if no improvement after 7 days. 07/03/24 06/23/26  Cirigliano, Vito V, DO  cholestyramine  light (PREVALITE ) 4 g packet Take 4 g by mouth 2 (two) times daily.    [provider]  cyclobenzaprine  (FLEXERIL ) 5 MG tablet Take 1 tablet (5 mg total) by mouth 2 (two) times daily as needed for up to 10 doses for muscle spasms. 01/03/24   Curatolo, Adam, DO  dicyclomine  (BENTYL ) 20 MG tablet Take 1 tablet (20 mg total) by mouth 3 (three) times daily as needed for spasms. 10/11/24   Charlanne Groom, MD  diphenhydrAMINE  (BENADRYL ) 25 MG tablet Take 25 mg by mouth. 01/28/22   [provider]  diphenoxylate -atropine  (LOMOTIL ) 2.5-0.025 MG tablet Take 1 tablet by mouth 2 (two) times daily. 09/22/24   Luiz Channel, MD  estradiol  (ESTRACE ) 0.1 MG/GM vaginal cream Place 0.5 g vaginally 2 (two) times a week. Place 0.5g nightly for two weeks then twice a week after 02/03/24   Guadlupe Dull T, MD  famotidine  (PEPCID ) 20 MG tablet Take 1 tablet (20 mg total) by mouth at bedtime. 09/16/23   Domenica Harlene LABOR, MD  ferrous sulfate (FEROSUL) 325 (65 FE) MG tablet Take 1 tablet (325 mg total) by mouth daily with breakfast. 01/19/24   Domenica Harlene LABOR, MD  fluticasone  (FLONASE ) 50 MCG/ACT nasal spray Place 2 sprays into both nostrils daily. 01/07/20   Joy, Shawn C, PA-C  furosemide  (LASIX ) 20 MG tablet Take 1 tablet (20 mg total) by mouth daily as needed (fluid retention (feet swelling)). 09/06/24   Domenica Harlene LABOR, MD  hydrOXYzine  (ATARAX ) 10 MG tablet TAKE ONE (1) TABLET BY MOUTH 3 TIMES  DAILY AS NEEDED FOR ITCHING 03/03/23   Domenica Harlene LABOR, MD  hyoscyamine  (LEVSIN  SL) 0.125 MG SL tablet Place 1 tablet (0.125 mg total) under the tongue every 6 (six) hours as needed. 08/24/24   Domenica Harlene LABOR, MD  lidocaine  (XYLOCAINE ) 5 % ointment Apply 1 Application topically as needed. 01/31/24   Guadlupe Dull DASEN, MD  lidocaine -prilocaine  (EMLA ) cream Apply 1 Application topically as needed. 07/01/23   Timmy Maude JONELLE, MD  loperamide  (IMODIUM ) 2 MG capsule Take 4 mg by mouth in the morning and at bedtime.    [provider]  losartan  (COZAAR ) 50 MG tablet Take 50 mg by mouth in the morning and at bedtime. 04/14/21   [provider]  Magnesium   Glycinate 100 MG CAPS Take 200 mg by mouth at bedtime. 09/21/22   [provider]  meclizine  (ANTIVERT ) 12.5 MG tablet Take 12.5 mg by mouth 2 (two) times daily as needed for dizziness or nausea. 01/28/22   [provider]  metoprolol  succinate (TOPROL -XL) 50 MG 24 hr tablet Take 1 tablet (50 mg total) by mouth 2 (two) times daily. 02/23/23   Domenica Harlene LABOR, MD  montelukast  (SINGULAIR ) 10 MG tablet Take 1 tablet (10 mg total) by mouth at bedtime. Please start taking this on September 4. 07/01/23   Timmy Maude SAUNDERS, MD  Multiple Vitamins-Minerals (CENTRUM SILVER  WOMEN 50+ PO) Take 1 tablet by mouth daily at 6 (six) AM.    [provider]  neomycin -polymyxin-hydrocortisone (CORTISPORIN) OTIC solution Place 4 drops into both ears 4 (four) times daily. 05/18/24   Domenica Harlene LABOR, MD  oxyCODONE -acetaminophen  (PERCOCET) 5-325 MG tablet Take 2 tablets by mouth every 6 (six) hours as needed. 08/07/23   Patt Alm Macho, MD  pantoprazole  (PROTONIX ) 40 MG tablet Take 1 tablet (40 mg total) by mouth daily. 09/03/23   O'Sullivan, Melissa, NP  potassium chloride  SA (KLOR-CON  M) 20 MEQ tablet Take 1 tablet (20 mEq total) by mouth daily. 09/10/24   Domenica Harlene LABOR, MD  prochlorperazine  (COMPAZINE ) 10 MG tablet Take 0.5 tablets (5 mg total)  by mouth 2 (two) times daily as needed for nausea or vomiting. 12/02/23   Domenica Harlene LABOR, MD  Respiratory Therapy Supplies (PILLOW MASK/ADULT) MISC  09/29/23   [provider]  rifaximin  (XIFAXAN ) 200 MG tablet Take 1 tablet (200 mg total) by mouth 2 (two) times daily. 08/25/24   Timmy Maude SAUNDERS, MD  scopolamine  (TRANSDERM-SCOP) 1 MG/3DAYS Place 1 patch (1.5 mg total) onto the skin every 3 (three) days. 07/01/23   Timmy Maude SAUNDERS, MD  spironolactone  (ALDACTONE ) 25 MG tablet Take 25 mg by mouth daily.    [provider]  sucralfate  (CARAFATE ) 1 GM/10ML suspension Take 10 mLs (1 g total) by mouth 4 (four) times daily -  with meals and at bedtime. 05/18/24   Domenica Harlene LABOR, MD  terbinafine (LAMISIL) 250 MG tablet Take 250 mg by mouth daily. 07/30/24   [provider]  triamcinolone  cream (KENALOG ) 0.5 % Apply topically. 07/30/24   [provider]  Vibegron  (GEMTESA ) 75 MG TABS Take 1 tablet (75 mg total) by mouth daily. 06/15/24   Guadlupe Lianne DASEN, MD    Allergies: Benazepril , Ondansetron  hcl, Codeine, and Morphine    Review of Systems  Updated Vital Signs BP (!) 141/126   Pulse 95   Temp 98.2 F (36.8 C) (Oral)   Resp 19   Ht 5' 4 (1.626 m)   Wt 88.9 kg   SpO2 100%   BMI 33.64 kg/m   Physical Exam Vitals and nursing note reviewed.  Constitutional:      General: She is not in acute distress.    Appearance: She is well-developed. She is not diaphoretic.  HENT:     Head: Normocephalic and atraumatic.  Eyes:     Conjunctiva/sclera: Conjunctivae normal.  Cardiovascular:     Rate and Rhythm: Normal rate and regular rhythm.     Heart sounds: Normal heart sounds. No murmur heard.    No friction rub. No gallop.  Pulmonary:     Effort: Pulmonary effort is normal. No respiratory distress.     Breath sounds: Normal breath sounds. No wheezing or rales.  Abdominal:     General: There  is no distension.     Palpations: Abdomen is soft.     Tenderness: There is  no abdominal tenderness. There is no guarding.  Musculoskeletal:        General: Tenderness (lumbar spine) present.     Cervical back: Normal range of motion.  Skin:    General: Skin is warm and dry.     Findings: No erythema or rash.  Neurological:     Mental Status: She is alert and oriented to person, place, and time.     Sensory: No sensory deficit (normal lower extremity sensation).     Motor: No weakness (normal lower ext strength).     (all labs ordered are listed, but only abnormal results are displayed) Labs Reviewed  COMPREHENSIVE METABOLIC PANEL WITH GFR - Abnormal; Notable for the following components:      Result Value   Glucose, Bld 107 (*)    All other components within normal limits  URINALYSIS, W/ REFLEX TO CULTURE (INFECTION SUSPECTED) - Abnormal; Notable for the following components:   Leukocytes,Ua SMALL (*)    Bacteria, UA FEW (*)    All other components within normal limits  RESP PANEL BY RT-PCR (RSV, FLU A&B, COVID)  [id number]  URINE CULTURE  CBC WITH DIFFERENTIAL/PLATELET    EKG: None  Radiology: DG Lumbar Spine Complete Result Date: 11/03/2024 EXAM: 4 VIEW(S) XRAY OF THE LUMBAR SPINE 11/03/2024 03:38:00 PM COMPARISON: X-ray of the lumbar spine 03/08/2024, CT lumbar spine 01/03/2024. CLINICAL HISTORY: back pain FINDINGS: LUMBAR SPINE: BONES: Vertebral body heights are maintained. Diffusely decreased bone density. Limited evaluation due to overlapping osseous structures and overlying soft tissues. DISCS AND DEGENERATIVE CHANGES: Stable grade 1 anterolisthesis of L4 on L5. Multilevel mild degenerative change of the spine. A densely sclerotic lesion is again noted along the S1-S2 intervertebral disc space. SOFT TISSUES: No acute abnormality. IMPRESSION: 1. No acute findings. 2. Stable grade 1 anterolisthesis of L4 on L5. Electronically signed by: Morgane Naveau MD 11/03/2024 04:17 PM EST RP Workstation: [id number]     Procedures   Medications Ordered in the  ED  lactated ringers  bolus 1,000 mL (0 mLs Intravenous Stopped 11/03/24 1659)                                    Medical Decision Making Amount and/or Complexity of Data Reviewed Labs: ordered. Radiology: ordered.  Risk Prescription drug management.   68 yo female with history of multiple myeloma in remission, IBS-D, previous history of CDiff, CVA, CHF who presents with diarrhea and back pain.   Regarding back pain: Patient has a normal neurologic exam and denies any urinary retention or overflow incontinence, stool incontinence, saddle anesthesia, fever, IV drug use, trauma, chronic steroid use or immunocompromise and have low suspicion suspicion for cauda equina, fracture, epidural abscess, or vertebral osteomyelitis.  XR with stable grade 1 anterolisthesis L4/L5.  UA without signs of UTI and culture sent.  Recommend continued follow up with PCP, oncology in setting of multiple myeloma.  Given rx for muscle relaxant  Regarding diarrhea: Abdominal exam benign, doubt acute diverticulitis or other surgical abdominal pathology.    Labs completed and personally evaluated and interpreted by me show no clinically significant electrolyte abnormalities, anemia, and no sign of AKI.  COVID/flu/RSV testing done to evaluate for this as a cause of diarrhea is negative.  Negative CDiff, campylobacter, salmonella, e coli 3 days ago.  Consider other viral etiology  of flare of previously diagnosed IBS-D.   Given IV fluid. Recommend continued follow up with PCP/GI. Patient discharged in stable condition with understanding of reasons to return.     Final diagnoses:  Diarrhea, unspecified type  Acute low back pain without sciatica, unspecified back pain laterality    ED Discharge Orders          Ordered    prochlorperazine  (COMPAZINE ) 10 MG tablet  2 times daily PRN        11/03/24 1650    methocarbamol  (ROBAXIN ) 500 MG tablet  Every 8 hours PRN        11/03/24 1650                Dreama Longs, MD 11/04/24 [phone]  "

## 2024-11-04 LAB — STOOL CULTURE: E coli, Shiga toxin Assay: NEGATIVE

## 2024-11-04 NOTE — Progress Notes (Deleted)
 [facility] [address], [address] [place], [place] [address] [phone]  Date:  11/06/2024   Name:  Tami [name]   DOB:  Oct 31, 1956   MRN:  [id number]  PCP:  Domenica Harlene DELENA, MD    Chief Complaint: No chief complaint on file.   History of Present Illness:  Tami [name] is a 68 y.o. very pleasant female patient who presents with the following:  Primary patient of my partner Dr. Domenica seen today with concern of back pain History of multiple myeloma, stroke, hypertension, history of C. difficile, CHF with ICD, sleep apnea, GERD, fatty liver  I have not seen her myself previously, at least not recently  She was in the ER on 12/26 with concern of diarrhea She has multiple myeloma, she was seen in oncology clinic about 1 month ago-at that time she seemed to be doing well  She was then seen by her gastroenterologist on 12/3 to follow-up on what looks like some persistent diarrhea issues: #1. Epi pain with N/V (resolved). Neg extensive eval including multiple EGDs/CT. Most recent neg EGD 07/03/2024 with neg bx. #2. Diarrhea (resolved after stopping Mg) - getting fluids every week. Neg stool for C Diff, calprotectin. Neg colon to TI with Bx Feb 2025. No wt loss. Prev Dx IBS-D Plan: - Continue bentyl  20mg  po TID prn #90, 4RF - Continue Cholestramine 4g po every day in apple sauce.  - Continue Amitryptline 25mg  po at bedtime #30, 11RF - Continue Zofran  4mg  po Q8hrs prn. - Stop Carafate , other antinausea medications and Lomotil . - FU in 6 months.  My partner Dr. Antonio Meth saw her in the office on 12/11 with concern of dysuria Urine culture was positive for a multidrug-resistant UTI She had a saline infusion for hydration at oncology clinic on 12/23  Discussed the use of AI scribe software for clinical note transcription with the patient, who gave verbal consent to proceed.  History of Present Illness     Patient  Active Problem List   Diagnosis Date Noted   Regurgitation of food 07/03/2024   Nausea and vomiting 07/03/2024   Abdominal pain, epigastric 07/03/2024   Stress incontinence 01/31/2024   Nocturia 01/31/2024   Pelvic pain 01/31/2024   Incontinence of feces 01/31/2024   Vaginal atrophy 01/31/2024   Vaginal discharge 01/31/2024   Acute pain of left shoulder 09/03/2023   Chronic combined systolic and diastolic congestive heart failure (HCC) 03/28/2023   Intractable nausea and vomiting 03/27/2023   Asthma 12/20/2022   Diarrhea    Hypocalcemia 08/25/2022   Iron  deficiency anemia due to chronic blood loss 05/28/2022   Chronic diarrhea 05/27/2022   Soft tissue lesion 04/28/2022   Obesity (BMI 30-39.9) 04/28/2022   SBO (small bowel obstruction) (HCC) 04/27/2022   Dehydration 04/03/2022   Acute vaginitis 03/18/2022   Cervical cancer screening 03/18/2022   Dizziness 12/11/2021   Visual changes 11/26/2021   Ear fullness, bilateral 08/12/2021   Bilateral knee pain 08/08/2021   History of 2019 novel coronavirus disease (COVID-19) 12/06/2020   Vitamin deficiency 07/31/2020   Acute combined systolic and diastolic heart failure (HCC) 05/09/2020   Hypomagnesemia 03/04/2020   Thrombocytopenia 03/04/2020   Dysphagia 03/04/2020   Constipation 03/04/2020   Sensation of pressure in bladder area 01/17/2020   Hyperglycemia 01/17/2020   Peripheral neuropathy 11/15/2019   Chronic migraine without aura, with intractable migraine, so stated, with status migrainosus 10/17/2019   ETD (Eustachian tube  dysfunction), bilateral 09/12/2019   Urinary frequency 07/17/2019   Headache 07/17/2019   Palpitation 05/15/2019   Clostridium difficile diarrhea 09/04/2018   Thyroid  nodule 08/26/2018   Multiple myeloma (HCC) 07/20/2018   Multiple myeloma not having achieved remission (HCC) 07/20/2018   Hematuria 07/11/2018   Seasonal allergies 06/23/2018   Right shoulder pain 06/07/2018   Neck pain 06/07/2018    Paresthesias 06/07/2018   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   Pain in thoracic spine 03/18/2018   Thoracic back pain 02/15/2018   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   Hypokalemia 08/20/2017   Anemia 08/20/2017   Elevated sed rate 08/15/2017   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/2017   Epigastric abdominal pain 03/22/2017   Myalgia 12/31/2016   Abnormal SPEP 07/26/2016   Low back pain 07/14/2016   Insomnia 12/29/2015   Tension headache 10/21/2015   Muscle spasms of neck 10/18/2015   Dysuria 10/13/2015   AP (abdominal pain) 07/07/2015   Cardiomyopathy (HCC) 04/19/2015   Chest pain 04/19/2015   Obstructive sleep apnea 03/31/2015   Arthritis    Hyperlipidemia    Hypertension    Depression    Stroke [facility])    GERD (gastroesophageal reflux disease)    Bowel obstruction (HCC) 11/09/2014    Past Medical History:  Diagnosis Date   Abnormal SPEP 07/26/2016   Arthritis    knees, hands   Back pain 07/14/2016   Bowel obstruction (HCC) 11/2014   C. 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   Multiple myeloma (HCC) 07/20/2018   Multiple myeloma not having achieved remission (HCC) 07/20/2018   Myalgia 12/31/2016   Obstructive sleep apnea 03/31/2015   Presence of permanent cardiac pacemaker    SOB (shortness of breath) 04/09/2016   Stroke (HCC)    TIAs   TIA (transient ischemic attack)    UTI (urinary tract infection)     Past Surgical History:  Procedure Laterality Date   ABDOMINAL HYSTERECTOMY     menorraghia,  2006, total   BIOPSY  09/01/2022   Procedure: BIOPSY;  Surgeon: Charlanne Groom, MD;  Location: [facility];  Service: Gastroenterology;;   CHOLECYSTECTOMY     COLONOSCOPY  2018   [facility] per patient   COLONOSCOPY WITH PROPOFOL  N/A 09/01/2022   Procedure: COLONOSCOPY WITH PROPOFOL ;  Surgeon: Charlanne Groom, MD;  Location: [facility];  Service: Gastroenterology;  Laterality: N/A;   ESOPHAGOGASTRODUODENOSCOPY  2018   [facility]    ESOPHAGOGASTRODUODENOSCOPY N/A 07/03/2024   Procedure: EGD (ESOPHAGOGASTRODUODENOSCOPY);  Surgeon: San Sandor GAILS, DO;  Location: [facility];  Service: Gastroenterology;  Laterality: N/A;   ESOPHAGOGASTRODUODENOSCOPY (EGD) WITH PROPOFOL  N/A 09/01/2022   Procedure: ESOPHAGOGASTRODUODENOSCOPY (EGD) WITH PROPOFOL ;  Surgeon: Charlanne Groom, MD;  Location: [facility];  Service: Gastroenterology;  Laterality: N/A;   IR IMAGING GUIDED PORT INSERTION  06/29/2023   KNEE SURGERY     right, repair torn torn cartialage   PACEMAKER INSERTION  08/2014   changed last in 2023 per [facility] at [facility]  TUBAL LIGATION      Social History[1]  Family History  Problem Relation Age of Onset   Diabetes Mother    Hypertension Mother    Heart disease Mother        s/p 1 stent   Hyperlipidemia Mother    Arthritis Mother    Colon cancer Father 4   Irritable bowel syndrome Sister    Hypertension Maternal Grandmother    Arthritis Maternal Grandmother    Heart disease Maternal Grandfather        MI   Hypertension Maternal Grandfather    Arthritis Maternal Grandfather    Hyperlipidemia Daughter    Hypertension Daughter    Hypertension Son    Esophageal cancer Neg Hx    Uterine cancer Neg Hx    Pancreatic cancer Neg Hx     Allergies[2]  Medication list has been reviewed and updated.  Medications Ordered Prior to Encounter[3]  Review of Systems:  As per HPI- otherwise negative.   Physical Examination: There were no  vitals filed for this visit. There were no vitals filed for this visit. There is no height or weight on file to calculate BMI. Ideal Body Weight:    GEN: no acute distress. HEENT: Atraumatic, Normocephalic.  Ears and Nose: No external deformity. CV: RRR, No M/G/R. No JVD. No thrill. No extra heart sounds. PULM: CTA B, no wheezes, crackles, rhonchi. No retractions. No resp. distress. No accessory muscle use. ABD: S, NT, ND, +BS. No rebound. No HSM. EXTR: No c/c/e PSYCH: Normally interactive. Conversant.    Assessment and Plan: No diagnosis found.  Assessment & Plan   Signed Harlene Schroeder, MD    [1]  Social History Tobacco Use   Smoking status: Never    Passive exposure: Never   Smokeless tobacco: Never  Vaping Use   Vaping status: Never Used  Substance Use Topics   Alcohol use: No   Drug use: No  [2]  Allergies Allergen Reactions   Benazepril  Anaphylaxis, Swelling and Hives    angioedema Throat and lip swelling   Ondansetron  Hcl Hives    Redness post IV admin on 07/05/17   Codeine Nausea And Vomiting   Morphine Hives    Redness noted post IV admin on 07/05/17  [3]  Current Outpatient Medications on File Prior to Visit  Medication Sig Dispense Refill   acetaminophen  (TYLENOL ) 500 MG tablet Take 500 mg by mouth every 6 (six) hours as needed.     acyclovir  (ZOVIRAX ) 400 MG tablet Take 1 tablet (400 mg total) by mouth 2 (two) times daily. 60 tablet 6   albuterol  (PROVENTIL ) (2.5 MG/3ML) 0.083% nebulizer solution Take 3 mLs (2.5 mg total) by nebulization every 6 (six) hours as needed for wheezing or shortness of breath. 150 mL 2   albuterol  (VENTOLIN  HFA) 108 (90 Base) MCG/ACT inhaler Inhale 2 puffs into the lungs every 6 (six) hours as needed for wheezing or shortness of breath. 18 g 5   amitriptyline  (ELAVIL ) 25 MG tablet Take 1 tablet (25 mg total) by mouth at bedtime. 30 tablet 6   amLODipine  (NORVASC ) 5 MG tablet Take 5 mg by mouth daily.     aspirin  EC 81 MG  tablet Take 81 mg by mouth daily. Swallow whole.     budesonide  (PULMICORT ) 0.5 MG/2ML nebulizer solution Take 2 mLs (0.5 mg total) by nebulization 2 (two) times daily as needed (cough, wheezing or shortness of breath). 120 mL 11   cephALEXin  (KEFLEX ) 500 MG capsule Take 1  capsule (500 mg total) by mouth 2 (two) times daily. 14 capsule 0   cetirizine  (ZYRTEC ) 10 MG tablet Take 10 mg by mouth at bedtime.     Cholecalciferol (VITAMIN D3) 50 MCG (2000 UT) TABS Take 2,000 Units by mouth in the morning.     cholestyramine  (QUESTRAN ) 4 g packet Take 1 packet by mouth daily. Separate from other medications by at least 2 hours. Start at daily and can increase to twice daily if no improvement after 7 days. 60 each 11   cholestyramine  light (PREVALITE ) 4 g packet Take 4 g by mouth 2 (two) times daily.     cyclobenzaprine  (FLEXERIL ) 5 MG tablet Take 1 tablet (5 mg total) by mouth 2 (two) times daily as needed for up to 10 doses for muscle spasms. 10 tablet 0   dicyclomine  (BENTYL ) 20 MG tablet Take 1 tablet (20 mg total) by mouth 3 (three) times daily as needed for spasms. 90 tablet 4   diphenhydrAMINE  (BENADRYL ) 25 MG tablet Take 25 mg by mouth.     diphenoxylate -atropine  (LOMOTIL ) 2.5-0.025 MG tablet Take 1 tablet by mouth 2 (two) times daily. 90 tablet 4   estradiol  (ESTRACE ) 0.1 MG/GM vaginal cream Place 0.5 g vaginally 2 (two) times a week. Place 0.5g nightly for two weeks then twice a week after 30 g 3   famotidine  (PEPCID ) 20 MG tablet Take 1 tablet (20 mg total) by mouth at bedtime. 30 tablet 2   ferrous sulfate (FEROSUL) 325 (65 FE) MG tablet Take 1 tablet (325 mg total) by mouth daily with breakfast. 90 tablet 0   fluticasone  (FLONASE ) 50 MCG/ACT nasal spray Place 2 sprays into both nostrils daily. 16 g 0   furosemide  (LASIX ) 20 MG tablet Take 1 tablet (20 mg total) by mouth daily as needed (fluid retention (feet swelling)). 30 tablet 1   hydrOXYzine  (ATARAX ) 10 MG tablet TAKE ONE (1) TABLET BY MOUTH  3 TIMES DAILY AS NEEDED FOR ITCHING 30 tablet 2   hyoscyamine  (LEVSIN  SL) 0.125 MG SL tablet Place 1 tablet (0.125 mg total) under the tongue every 6 (six) hours as needed. 30 tablet 2   lidocaine  (XYLOCAINE ) 5 % ointment Apply 1 Application topically as needed. 35.44 g 0   lidocaine -prilocaine  (EMLA ) cream Apply 1 Application topically as needed. 30 g 0   loperamide  (IMODIUM ) 2 MG capsule Take 4 mg by mouth in the morning and at bedtime.     losartan  (COZAAR ) 50 MG tablet Take 50 mg by mouth in the morning and at bedtime.     Magnesium  Glycinate 100 MG CAPS Take 200 mg by mouth at bedtime.     meclizine  (ANTIVERT ) 12.5 MG tablet Take 12.5 mg by mouth 2 (two) times daily as needed for dizziness or nausea.     methocarbamol  (ROBAXIN ) 500 MG tablet Take 1 tablet (500 mg total) by mouth every 8 (eight) hours as needed for muscle spasms. 20 tablet 0   metoprolol  succinate (TOPROL -XL) 50 MG 24 hr tablet Take 1 tablet (50 mg total) by mouth 2 (two) times daily. 60 tablet 2   montelukast  (SINGULAIR ) 10 MG tablet Take 1 tablet (10 mg total) by mouth at bedtime. Please start taking this on September 4. 30 tablet 3   Multiple Vitamins-Minerals (CENTRUM SILVER  WOMEN 50+ PO) Take 1 tablet by mouth daily at 6 (six) AM.     neomycin -polymyxin-hydrocortisone (CORTISPORIN) OTIC solution Place 4 drops into both ears 4 (four) times daily. 10 mL 4   oxyCODONE -acetaminophen  (  PERCOCET) 5-325 MG tablet Take 2 tablets by mouth every 6 (six) hours as needed. 10 tablet 0   pantoprazole  (PROTONIX ) 40 MG tablet Take 1 tablet (40 mg total) by mouth daily. 30 tablet 3   potassium chloride  SA (KLOR-CON  M) 20 MEQ tablet Take 1 tablet (20 mEq total) by mouth daily. 30 tablet 5   prochlorperazine  (COMPAZINE ) 10 MG tablet Take 0.5 tablets (5 mg total) by mouth 2 (two) times daily as needed for nausea or vomiting. 60 tablet 1   prochlorperazine  (COMPAZINE ) 10 MG tablet Take 1 tablet (10 mg total) by mouth 2 (two) times daily as  needed for nausea or vomiting. 10 tablet 0   Respiratory Therapy Supplies (PILLOW MASK/ADULT) MISC      rifaximin  (XIFAXAN ) 200 MG tablet Take 1 tablet (200 mg total) by mouth 2 (two) times daily. 60 tablet 4   scopolamine  (TRANSDERM-SCOP) 1 MG/3DAYS Place 1 patch (1.5 mg total) onto the skin every 3 (three) days. 10 patch 1   spironolactone  (ALDACTONE ) 25 MG tablet Take 25 mg by mouth daily.     sucralfate  (CARAFATE ) 1 GM/10ML suspension Take 10 mLs (1 g total) by mouth 4 (four) times daily -  with meals and at bedtime. 473 mL 1   terbinafine (LAMISIL) 250 MG tablet Take 250 mg by mouth daily.     triamcinolone  cream (KENALOG ) 0.5 % Apply topically.     Vibegron  (GEMTESA ) 75 MG TABS Take 1 tablet (75 mg total) by mouth daily. 30 tablet 2   Current Facility-Administered Medications on File Prior to Visit  Medication Dose Route Frequency Provider Last Rate Last Admin   nitroGLYCERIN  (NITROSTAT ) SL tablet 0.4 mg  0.4 mg Sublingual Once Blyth, Stacey A, MD       "

## 2024-11-05 LAB — URINE CULTURE: Culture: >=100000 — AB

## 2024-11-06 ENCOUNTER — Telehealth: Payer: Self-pay

## 2024-11-06 ENCOUNTER — Telehealth (HOSPITAL_BASED_OUTPATIENT_CLINIC_OR_DEPARTMENT_OTHER): Payer: Self-pay | Admitting: *Deleted

## 2024-11-06 ENCOUNTER — Ambulatory Visit: Admitting: Family Medicine

## 2024-11-06 NOTE — Telephone Encounter (Signed)
 Post ED Visit - Positive Culture Follow-up  Culture report reviewed by antimicrobial stewardship pharmacist: [facility] [x]  Leonor Bash, [place].D. []  Venetia Gully, Pharm.D., BCPS AQ-ID []  Garrel Crews, Pharm.D., BCPS []  Almarie Lunger, Pharm.D., BCPS []  [place], [address].D., BCPS, AAHIVP []  Rosaline Bihari, Pharm.D., BCPS, AAHIVP []  Vernell Meier, PharmD, BCPS []  Latanya Hint, PharmD, BCPS []  Donald Medley, PharmD, BCPS []  Rocky Bold, PharmD []  Dorothyann Alert, PharmD, BCPS []  Morene Babe, PharmD  [facility] []  Rosaline Edison, PharmD []  Romona Bliss, PharmD []  Dolphus Roller, PharmD []  Veva Seip, Rph []  Vernell Daunt) Leonce, PharmD []  Eva Allis, PharmD []  Rosaline Millet, PharmD []  Iantha Batch, PharmD []  Arvin Gauss, PharmD []  Wanda Hasting, PharmD []  Ronal Rav, PharmD []  Rocky Slade, PharmD []  Bard Jeans, PharmD   Positive urine culture No urinary symptoms; no further patient follow-up is required at this time.  Lorita Barnie Pereyra 11/06/2024, 11:40 AM

## 2024-11-06 NOTE — Telephone Encounter (Signed)
 Oral Oncology Patient Advocate Encounter   Received notification that prior authorization for Xifaxan  is required.   PA submitted on 11/06/2024 Key [id number]  Status is pending      Charlott Hamilton,  CPhT-Adv  she/her/hers [facility]  [facility] Special[facility] Pharm[facility] Services Pharmacy Technician Patient Advocate Specialist III [facility] Phone: [phone]  Fax: [phone] [email or web address]

## 2024-11-06 NOTE — Telephone Encounter (Signed)
 Received a notification regarding Prior Authorization from [facility] Medicare for Xifaxan . Authorization has been DENIED due to the following reason  Xifaxan  200mg  not FDA approved for chronic diarrhea associated with multiple myeloma and not supported by accepted references for off-label use. SABRA  Please advise if you wish us  to pursue an appeal, thanks!  Med Access Team

## 2024-11-07 ENCOUNTER — Other Ambulatory Visit (HOSPITAL_COMMUNITY): Payer: Self-pay

## 2024-11-07 ENCOUNTER — Telehealth: Payer: Self-pay

## 2024-11-07 NOTE — Telephone Encounter (Signed)
 Per lab tech Ova and parasite stool test was cancelled due to the wrong tubes to process. Do the patient still needs this test done? Stool culture and C diff was both negative.

## 2024-11-07 NOTE — Telephone Encounter (Addendum)
 Oral Oncology Patient Advocate Encounter   Prior authorization for Xifaxan  has been resent with diagnosis of C diff. A04.72   PA submitted on 11/07/2024 Key [id number] Status is pending      Charlott Hamilton,  CPhT-Adv  she/her/hers [facility]  [facility] Special[facility] Pharmacy Services Pharmacy Technician Patient Advocate Specialist III [facility] Phone: [phone]  Fax: [phone] [email or web address]

## 2024-11-07 NOTE — Telephone Encounter (Signed)
 Patient denies any diarrhea at this time and has a follow-up in February,2026 with Dr. Domenica.

## 2024-11-10 ENCOUNTER — Other Ambulatory Visit (HOSPITAL_COMMUNITY): Payer: Self-pay

## 2024-11-10 NOTE — Telephone Encounter (Signed)
 Oral Oncology Patient Advocate Encounter  Appeal for Xifaxan  has been approved.    Effective dates: 11/06/24 through 11/22/24  Patient has been notified via MyChart  Charlott Hamilton,  CPhT-Adv  she/her/hers [facility]  [facility] Specialty Pharmacy Services Pharmacy Technician Patient Advocate Specialist III [facility] Phone: [phone]  Fax: [phone] [email or web address]

## 2024-11-17 ENCOUNTER — Inpatient Hospital Stay

## 2024-11-17 ENCOUNTER — Inpatient Hospital Stay: Admitting: Hematology & Oncology

## 2024-11-17 ENCOUNTER — Inpatient Hospital Stay: Attending: Hematology & Oncology

## 2024-11-17 ENCOUNTER — Encounter: Payer: Self-pay | Admitting: Hematology & Oncology

## 2024-11-17 ENCOUNTER — Other Ambulatory Visit: Payer: Self-pay

## 2024-11-17 VITALS — BP 116/75 | HR 83 | Temp 98.6°F | Resp 20 | Ht 64.0 in | Wt 199.0 lb

## 2024-11-17 VITALS — BP 130/79 | HR 81 | Resp 18

## 2024-11-17 DIAGNOSIS — E612 Magnesium deficiency: Secondary | ICD-10-CM

## 2024-11-17 DIAGNOSIS — D801 Nonfamilial hypogammaglobulinemia: Secondary | ICD-10-CM

## 2024-11-17 DIAGNOSIS — D500 Iron deficiency anemia secondary to blood loss (chronic): Secondary | ICD-10-CM

## 2024-11-17 DIAGNOSIS — R197 Diarrhea, unspecified: Secondary | ICD-10-CM

## 2024-11-17 DIAGNOSIS — C9002 Multiple myeloma in relapse: Secondary | ICD-10-CM

## 2024-11-17 DIAGNOSIS — A0472 Enterocolitis due to Clostridium difficile, not specified as recurrent: Secondary | ICD-10-CM

## 2024-11-17 DIAGNOSIS — C9000 Multiple myeloma not having achieved remission: Secondary | ICD-10-CM

## 2024-11-17 DIAGNOSIS — I5041 Acute combined systolic (congestive) and diastolic (congestive) heart failure: Secondary | ICD-10-CM

## 2024-11-17 DIAGNOSIS — K219 Gastro-esophageal reflux disease without esophagitis: Secondary | ICD-10-CM

## 2024-11-17 DIAGNOSIS — R002 Palpitations: Secondary | ICD-10-CM

## 2024-11-17 DIAGNOSIS — D518 Other vitamin B12 deficiency anemias: Secondary | ICD-10-CM

## 2024-11-17 DIAGNOSIS — E860 Dehydration: Secondary | ICD-10-CM

## 2024-11-17 LAB — CMP (CANCER CENTER ONLY)
ALT: 26 U/L (ref 0–44)
AST: 22 U/L (ref 15–41)
Albumin: 4.2 g/dL (ref 3.5–5.0)
Alkaline Phosphatase: 54 U/L (ref 38–126)
Anion gap: 10 (ref 5–15)
BUN: 18 mg/dL (ref 8–23)
CO2: 25 mmol/L (ref 22–32)
Calcium: 9.2 mg/dL (ref 8.9–10.3)
Chloride: 107 mmol/L (ref 98–111)
Creatinine: 0.61 mg/dL (ref 0.44–1.00)
GFR, Estimated: >60 mL/min
Glucose, Bld: 132 mg/dL — ABNORMAL HIGH (ref 70–99)
Potassium: 3.8 mmol/L (ref 3.5–5.1)
Sodium: 142 mmol/L (ref 135–145)
Total Bilirubin: 0.5 mg/dL (ref 0.0–1.2)
Total Protein: 6.4 g/dL — ABNORMAL LOW (ref 6.5–8.1)

## 2024-11-17 LAB — CBC WITH DIFFERENTIAL (CANCER CENTER ONLY)
Abs Immature Granulocytes: 0.03 K/uL (ref 0.00–0.07)
Basophils Absolute: 0 K/uL (ref 0.0–0.1)
Basophils Relative: 1 %
Eosinophils Absolute: 0.1 K/uL (ref 0.0–0.5)
Eosinophils Relative: 2 %
HCT: 36.3 % (ref 36.0–46.0)
Hemoglobin: 11.8 g/dL — ABNORMAL LOW (ref 12.0–15.0)
Immature Granulocytes: 1 %
Lymphocytes Relative: 29 %
Lymphs Abs: 1.2 K/uL (ref 0.7–4.0)
MCH: 29.9 pg (ref 26.0–34.0)
MCHC: 32.5 g/dL (ref 30.0–36.0)
MCV: 92.1 fL (ref 80.0–100.0)
Monocytes Absolute: 0.4 K/uL (ref 0.1–1.0)
Monocytes Relative: 9 %
Neutro Abs: 2.4 K/uL (ref 1.7–7.7)
Neutrophils Relative %: 58 %
Platelet Count: 149 K/uL — ABNORMAL LOW (ref 150–400)
RBC: 3.94 MIL/uL (ref 3.87–5.11)
RDW: 12.8 % (ref 11.5–15.5)
WBC Count: 4.1 K/uL (ref 4.0–10.5)
nRBC: 0 % (ref 0.0–0.2)

## 2024-11-17 LAB — LACTATE DEHYDROGENASE: LDH: 356 U/L — ABNORMAL HIGH (ref 105–235)

## 2024-11-17 LAB — MAGNESIUM: Magnesium: 1.7 mg/dL (ref 1.7–2.4)

## 2024-11-17 MED ORDER — POTASSIUM CHLORIDE CRYS ER 20 MEQ PO TBCR: 20.0000 meq | EXTENDED_RELEASE_TABLET | Freq: Every day | ORAL | 5 refills | Status: AC

## 2024-11-17 MED ORDER — SODIUM CHLORIDE 0.9 % IV SOLN: INTRAVENOUS | Status: DC

## 2024-11-17 NOTE — Patient Instructions (Signed)

## 2024-11-17 NOTE — Progress Notes (Signed)
 " Hematology and Oncology Follow Up Visit  Tami [name] [id number] 07-Feb-1956 69 y.o. 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  We last saw her right for Thanksgiving.  She is doing quite nicely.  She is not having a lot of problems with diarrhea.  Every now and then, she does have a little bit of loose stool.  She had acquired Christmas.  She is looking forward to this year.  She may do some traveling.  When we last saw her, her monoclonal spike was 0.3 g/dL.  This is holding steady.  Her IgG level was 860 mg/dL.  The Kappa light chain was 0.5 mg/dL.  She has had no cough or shortness of breath.  She has had no bleeding.  There has been no rashes.  She has had no fever.  She has had no headache.  She sees her cardiologist I think next week.  She has had no issues with her pacemaker.  Overall, I will say that her performance status is probably ECOG 1.     Medications:  Allergies as of 11/17/2024       Reactions   Benazepril  Anaphylaxis, Swelling, Hives   angioedema Throat and lip swelling   Ondansetron  Hcl Hives   Redness post IV admin on 07/05/17   Codeine Nausea And Vomiting   Morphine Hives   Redness noted post IV admin on 07/05/17        Medication List        Accurate as of November 17, 2024  8:35 AM. If you have any questions, ask your nurse or doctor.          acetaminophen  500 MG tablet Commonly known as: TYLENOL  Take 500 mg by mouth every 6 (six) hours as  needed.   acyclovir  400 MG tablet Commonly known as: ZOVIRAX  Take 1 tablet (400 mg total) by mouth 2 (two) times daily.   albuterol  (2.5 MG/3ML) 0.083% nebulizer solution Commonly known as: PROVENTIL  Take 3 mLs (2.5 mg total) by nebulization every 6 (six) hours as needed for wheezing or shortness of breath.   albuterol  108 (90 Base) MCG/ACT inhaler Commonly known as: VENTOLIN  HFA Inhale 2 puffs into the lungs every 6 (six) hours as needed for wheezing or shortness of breath.   amitriptyline  25 MG tablet Commonly known as: ELAVIL  Take 1 tablet (25 mg total) by mouth at bedtime.   amLODipine  5 MG tablet Commonly known as: NORVASC  Take 5 mg by mouth daily.   aspirin  EC 81 MG tablet Take 81 mg by mouth daily. Swallow whole.   budesonide  0.5 MG/2ML nebulizer solution Commonly known as: Pulmicort  Take 2 mLs (0.5 mg total) by nebulization 2 (two) times daily as needed (cough, wheezing or shortness of breath).   CENTRUM SILVER  WOMEN 50+ PO Take 1 tablet by mouth daily at 6 (six) AM.   cephALEXin  500 MG capsule Commonly known as: KEFLEX  Take 1 capsule (500 mg total)  by mouth 2 (two) times daily.   cetirizine  10 MG tablet Commonly known as: ZYRTEC  Take 10 mg by mouth at bedtime.   cholestyramine  4 g packet Commonly known as: Questran  Take 1 packet by mouth daily. Separate from other medications by at least 2 hours. Start at daily and can increase to twice daily if no improvement after 7 days.   cholestyramine  light 4 g packet Commonly known as: PREVALITE  Take 4 g by mouth 2 (two) times daily.   cyclobenzaprine  5 MG tablet Commonly known as: FLEXERIL  Take 1 tablet (5 mg total) by mouth 2 (two) times daily as needed for up to 10 doses for muscle spasms.   dicyclomine  20 MG tablet Commonly known as: BENTYL  Take 1 tablet (20 mg total) by mouth 3 (three) times daily as needed for spasms.   diphenhydrAMINE  25 MG tablet Commonly known as: BENADRYL  Take 25 mg by mouth.    diphenoxylate -atropine  2.5-0.025 MG tablet Commonly known as: Lomotil  Take 1 tablet by mouth 2 (two) times daily.   estradiol  0.1 MG/GM vaginal cream Commonly known as: ESTRACE  Place 0.5 g vaginally 2 (two) times a week. Place 0.5g nightly for two weeks then twice a week after   famotidine  20 MG tablet Commonly known as: PEPCID  Take 1 tablet (20 mg total) by mouth at bedtime.   ferrous sulfate 325 (65 FE) MG tablet Commonly known as: FeroSul Take 1 tablet (325 mg total) by mouth daily with breakfast.   fluticasone  50 MCG/ACT nasal spray Commonly known as: FLONASE  Place 2 sprays into both nostrils daily.   furosemide  20 MG tablet Commonly known as: LASIX  Take 1 tablet (20 mg total) by mouth daily as needed (fluid retention (feet swelling)).   Gemtesa  75 MG Tabs Generic drug: Vibegron  Take 1 tablet (75 mg total) by mouth daily.   hydrOXYzine  10 MG tablet Commonly known as: ATARAX  TAKE ONE (1) TABLET BY MOUTH 3 TIMES DAILY AS NEEDED FOR ITCHING   hyoscyamine  0.125 MG SL tablet Commonly known as: LEVSIN  SL Place 1 tablet (0.125 mg total) under the tongue every 6 (six) hours as needed.   lidocaine  5 % ointment Commonly known as: XYLOCAINE  Apply 1 Application topically as needed.   lidocaine -prilocaine  cream Commonly known as: EMLA  Apply 1 Application topically as needed.   loperamide  2 MG capsule Commonly known as: IMODIUM  Take 4 mg by mouth in the morning and at bedtime.   losartan  50 MG tablet Commonly known as: COZAAR  Take 50 mg by mouth in the morning and at bedtime.   Magnesium  Glycinate 100 MG Caps Take 200 mg by mouth at bedtime.   meclizine  12.5 MG tablet Commonly known as: ANTIVERT  Take 12.5 mg by mouth 2 (two) times daily as needed for dizziness or nausea.   methocarbamol  500 MG tablet Commonly known as: ROBAXIN  Take 1 tablet (500 mg total) by mouth every 8 (eight) hours as needed for muscle spasms.   metoprolol  succinate 50 MG 24 hr  tablet Commonly known as: TOPROL -XL Take 1 tablet (50 mg total) by mouth 2 (two) times daily.   montelukast  10 MG tablet Commonly known as: Singulair  Take 1 tablet (10 mg total) by mouth at bedtime. Please start taking this on September 4.   neomycin -polymyxin-hydrocortisone OTIC solution Commonly known as: CORTISPORIN Place 4 drops into both ears 4 (four) times daily.   oxyCODONE -acetaminophen  5-325 MG tablet Commonly known as: Percocet Take 2 tablets by mouth every 6 (six) hours as needed.   pantoprazole  40 MG tablet Commonly  known as: PROTONIX  Take 1 tablet (40 mg total) by mouth daily.   Pillow Mask/Adult Misc   potassium chloride  SA 20 MEQ tablet Commonly known as: KLOR-CON  M Take 1 tablet (20 mEq total) by mouth daily.   prochlorperazine  10 MG tablet Commonly known as: COMPAZINE  Take 0.5 tablets (5 mg total) by mouth 2 (two) times daily as needed for nausea or vomiting.   prochlorperazine  10 MG tablet Commonly known as: COMPAZINE  Take 1 tablet (10 mg total) by mouth 2 (two) times daily as needed for nausea or vomiting.   rifaximin  200 MG tablet Commonly known as: Xifaxan  Take 1 tablet (200 mg total) by mouth 2 (two) times daily.   scopolamine  1 MG/3DAYS Commonly known as: TRANSDERM-SCOP Place 1 patch (1.5 mg total) onto the skin every 3 (three) days.   spironolactone  25 MG tablet Commonly known as: ALDACTONE  Take 25 mg by mouth daily.   sucralfate  1 GM/10ML suspension Commonly known as: Carafate  Take 10 mLs (1 g total) by mouth 4 (four) times daily -  with meals and at bedtime.   terbinafine 250 MG tablet Commonly known as: LAMISIL Take 250 mg by mouth daily.   triamcinolone  cream 0.5 % Commonly known as: KENALOG  Apply topically.   Vitamin D3 50 MCG (2000 UT) Tabs Take 2,000 Units by mouth in the morning.        Allergies:  Allergies  Allergen Reactions   Benazepril  Anaphylaxis, Swelling and Hives    angioedema Throat and lip swelling    Ondansetron  Hcl Hives    Redness post IV admin on 07/05/17   Codeine Nausea And Vomiting   Morphine Hives    Redness noted post IV admin on 07/05/17    Past Medical History, Surgical history, Social history, and Family History were reviewed and updated.  Review of Systems: Review of Systems  Constitutional: Negative.   HENT: Negative.    Eyes: Negative.   Respiratory: Negative.    Cardiovascular: Negative.   Gastrointestinal:  Positive for abdominal pain, diarrhea and nausea.  Musculoskeletal:  Positive for joint pain and myalgias.  Skin: Negative.   Neurological: Negative.   Endo/Heme/Allergies: Negative.   Psychiatric/Behavioral: Negative.       Physical Exam:  Her temperature is 98.6.  Pulse 83.  Blood pressure 116/75.  Weight is 199 pounds.   Wt Readings from Last 3 Encounters:  11/17/24 199 lb (90.3 kg)  11/03/24 196 lb (88.9 kg)  10/19/24 196 lb 9.6 oz (89.2 kg)    Physical Exam Vitals reviewed.  HENT:     Head: Normocephalic and atraumatic.  Eyes:     Pupils: Pupils are equal, round, and reactive to light.  Cardiovascular:     Rate and Rhythm: Normal rate and regular rhythm.     Heart sounds: Normal heart sounds.  Pulmonary:     Effort: Pulmonary effort is normal.     Breath sounds: Normal breath sounds.  Abdominal:     General: Bowel sounds are normal.     Palpations: Abdomen is soft.  Musculoskeletal:        General: No tenderness or deformity. Normal range of motion.     Cervical back: Normal range of motion.  Lymphadenopathy:     Cervical: No cervical adenopathy.  Skin:    General: Skin is warm and dry.     Findings: No erythema or rash.  Neurological:     Mental Status: She is alert and oriented to person, place, and time.  Psychiatric:  Behavior: Behavior normal.        Thought Content: Thought content normal.        Judgment: Judgment normal.      Lab Results  Component Value Date   WBC 4.1 11/17/2024   HGB 11.8 (L) 11/17/2024    HCT 36.3 11/17/2024   MCV 92.1 11/17/2024   PLT 149 (L) 11/17/2024   Lab Results  Component Value Date   FERRITIN 166 11/26/2023   IRON  76 11/26/2023   TIBC 385 11/26/2023   UIBC 309 11/26/2023   IRONPCTSAT 20 11/26/2023   Lab Results  Component Value Date   RETICCTPCT 1.2 11/27/2022   RBC 3.94 11/17/2024   RETICCTABS 32,640 07/16/2016   Lab Results  Component Value Date   KPAFRELGTCHN 5.1 10/03/2024   LAMBDASER 2.5 (L) 10/03/2024   KAPLAMBRATIO 2.04 (H) 10/03/2024   Lab Results  Component Value Date   IGGSERUM 833 10/03/2024   IGGSERUM 905 10/03/2024   IGA 69 (L) 10/03/2024   IGA 70 (L) 10/03/2024   IGMSERUM 41 10/03/2024   IGMSERUM 44 10/03/2024   Lab Results  Component Value Date   TOTALPROTELP 6.6 10/03/2024   ALBUMINELP 3.7 10/03/2024   A1GS 0.2 10/03/2024   A2GS 0.9 10/03/2024   BETS 1.0 10/03/2024   BETA2SER 0.5 07/16/2016   GAMS 0.7 10/03/2024   MSPIKE 0.3 (H) 10/03/2024   SPEI Comment 03/22/2024     Chemistry      Component Value Date/Time   NA 142 11/03/2024 1422   NA 143 11/08/2017 1127   NA 140 12/31/2016 1000   K 3.9 11/03/2024 1422   K 4.0 11/08/2017 1127   K 3.6 12/31/2016 1000   CL 106 11/03/2024 1422   CL 107 11/08/2017 1127   CO2 23 11/03/2024 1422   CO2 25 11/08/2017 1127   CO2 24 12/31/2016 1000   BUN 17 11/03/2024 1422   BUN 19 11/08/2017 1127   BUN 22.3 12/31/2016 1000   CREATININE 0.66 11/03/2024 1422   CREATININE 0.66 10/31/2024 0904   CREATININE 0.63 03/15/2024 0947   CREATININE 0.8 12/31/2016 1000      Component Value Date/Time   CALCIUM 9.7 11/03/2024 1422   CALCIUM 9.9 11/08/2017 1127   CALCIUM 9.5 12/31/2016 1000   ALKPHOS 57 11/03/2024 1422   ALKPHOS 44 11/08/2017 1127   ALKPHOS 51 12/31/2016 1000   AST 28 11/03/2024 1422   AST 27 10/31/2024 0904   AST 28 12/31/2016 1000   ALT 38 11/03/2024 1422   ALT 37 10/31/2024 0904   ALT 36 11/08/2017 1127   ALT 16 12/31/2016 1000   BILITOT 0.5 11/03/2024 1422    BILITOT 0.6 10/31/2024 0904   BILITOT 0.46 12/31/2016 1000       Impression and Plan: Ms. Santoyo is a very pleasant 69 yo African American female with a previous IgG kappa MGUS with progression to myeloma. Again, she has responded very nicely to treatment.  We will continue to hold off on treating her myeloma for right now.  It has now been a year since she had any treatment.  Again her quality of life is better given that the diarrhea is better.  We will continue her on the Xifaxan .  She will get some IV fluid in the office today.    I went ahead and refilled her potassium.  We will plan to get her back to see us  in another 6 weeks or so.  Maude JONELLE Crease, MD 1/9/20268:35 AM  "

## 2024-11-18 LAB — IGG, IGA, IGM
IgA: 71 mg/dL — ABNORMAL LOW (ref 87–352)
IgG (Immunoglobin G), Serum: 760 mg/dL (ref 586–1602)
IgM (Immunoglobulin M), Srm: 43 mg/dL (ref 26–217)

## 2024-11-20 LAB — KAPPA/LAMBDA LIGHT CHAINS
Kappa free light chain: 6.4 mg/L (ref 3.3–19.4)
Kappa, lambda light chain ratio: 2.46 — ABNORMAL HIGH (ref 0.26–1.65)
Lambda free light chains: 2.6 mg/L — ABNORMAL LOW (ref 5.7–26.3)

## 2024-11-23 LAB — IMMUNOFIXATION REFLEX, SERUM
IgA: 71 mg/dL — ABNORMAL LOW (ref 87–352)
IgG (Immunoglobin G), Serum: 756 mg/dL (ref 586–1602)
IgM (Immunoglobulin M), Srm: 42 mg/dL (ref 26–217)

## 2024-11-23 LAB — PROTEIN ELECTROPHORESIS, SERUM, WITH REFLEX
A/G Ratio: 1.2 (ref 0.7–1.7)
Albumin ELP: 3.3 g/dL (ref 2.9–4.4)
Alpha-1-Globulin: 0.2 g/dL (ref 0.0–0.4)
Alpha-2-Globulin: 0.9 g/dL (ref 0.4–1.0)
Beta Globulin: 1 g/dL (ref 0.7–1.3)
Gamma Globulin: 0.6 g/dL (ref 0.4–1.8)
Globulin, Total: 2.7 g/dL (ref 2.2–3.9)
M-Spike, %: 0.3 g/dL — ABNORMAL HIGH
SPEP Interpretation: 0
Total Protein ELP: 6 g/dL (ref 6.0–8.5)

## 2024-12-12 ENCOUNTER — Telehealth: Payer: Self-pay | Admitting: Gastroenterology

## 2024-12-13 ENCOUNTER — Ambulatory Visit: Admitting: Internal Medicine

## 2024-12-21 ENCOUNTER — Ambulatory Visit: Admitting: Family Medicine

## 2024-12-29 ENCOUNTER — Inpatient Hospital Stay

## 2024-12-29 ENCOUNTER — Inpatient Hospital Stay: Admitting: Hematology & Oncology

## 2025-01-01 ENCOUNTER — Ambulatory Visit: Admitting: Family Medicine

## 2025-01-03 ENCOUNTER — Ambulatory Visit: Admitting: Internal Medicine

## 2025-08-07 ENCOUNTER — Ambulatory Visit
